# Patient Record
Sex: Female | Born: 1950 | Race: White | Hispanic: No | Marital: Married | Smoking: Current every day smoker
Health system: Southern US, Community
[De-identification: ages and names within clinical notes are randomized; demographics above are authoritative.]

## PROBLEM LIST (undated history)

## (undated) ENCOUNTER — Emergency Department (HOSPITAL_COMMUNITY): Disposition: A | Discharge status: Home / Self Care | Payer: Medicaid Other

## (undated) DIAGNOSIS — C801 Malignant (primary) neoplasm, unspecified: Secondary | ICD-10-CM

## (undated) DIAGNOSIS — J449 Chronic obstructive pulmonary disease, unspecified: Secondary | ICD-10-CM

## (undated) DIAGNOSIS — Z765 Malingerer [conscious simulation]: Secondary | ICD-10-CM

## (undated) DIAGNOSIS — F1910 Other psychoactive substance abuse, uncomplicated: Secondary | ICD-10-CM

## (undated) DIAGNOSIS — R569 Unspecified convulsions: Secondary | ICD-10-CM

## (undated) DIAGNOSIS — K859 Acute pancreatitis without necrosis or infection, unspecified: Secondary | ICD-10-CM

## (undated) HISTORY — PX: HIP SURGERY: SHX245

## (undated) HISTORY — PX: PARTIAL GASTRECTOMY: SHX2172

## (undated) HISTORY — PX: KIDNEY SURGERY: SHX687

---

## 1997-06-24 ENCOUNTER — Emergency Department (HOSPITAL_COMMUNITY): Admission: EM | Admit: 1997-06-24 | Discharge: 1997-06-25 | Payer: Self-pay | Admitting: Internal Medicine

## 1997-07-11 ENCOUNTER — Emergency Department (HOSPITAL_COMMUNITY): Admission: EM | Admit: 1997-07-11 | Discharge: 1997-07-11 | Payer: Self-pay | Admitting: Emergency Medicine

## 1997-08-12 ENCOUNTER — Emergency Department (HOSPITAL_COMMUNITY): Admission: EM | Admit: 1997-08-12 | Discharge: 1997-08-12 | Payer: Self-pay | Admitting: Emergency Medicine

## 1997-08-16 ENCOUNTER — Emergency Department (HOSPITAL_COMMUNITY): Admission: EM | Admit: 1997-08-16 | Discharge: 1997-08-16 | Payer: Self-pay | Admitting: Emergency Medicine

## 1997-09-26 ENCOUNTER — Emergency Department (HOSPITAL_COMMUNITY): Admission: EM | Admit: 1997-09-26 | Discharge: 1997-09-26 | Payer: Self-pay | Admitting: Emergency Medicine

## 1997-10-26 ENCOUNTER — Inpatient Hospital Stay (HOSPITAL_COMMUNITY): Admission: EM | Admit: 1997-10-26 | Discharge: 1997-10-29 | Payer: Self-pay | Admitting: Emergency Medicine

## 1997-10-28 ENCOUNTER — Encounter: Payer: Self-pay | Admitting: Gastroenterology

## 1997-10-28 ENCOUNTER — Encounter (HOSPITAL_BASED_OUTPATIENT_CLINIC_OR_DEPARTMENT_OTHER): Payer: Self-pay | Admitting: General Surgery

## 1997-12-02 ENCOUNTER — Emergency Department (HOSPITAL_COMMUNITY): Admission: EM | Admit: 1997-12-02 | Discharge: 1997-12-03 | Payer: Self-pay | Admitting: Emergency Medicine

## 1997-12-02 ENCOUNTER — Encounter: Payer: Self-pay | Admitting: Emergency Medicine

## 1997-12-07 ENCOUNTER — Emergency Department (HOSPITAL_COMMUNITY): Admission: EM | Admit: 1997-12-07 | Discharge: 1997-12-07 | Payer: Self-pay | Admitting: Emergency Medicine

## 1997-12-07 ENCOUNTER — Encounter: Payer: Self-pay | Admitting: Emergency Medicine

## 1998-03-23 ENCOUNTER — Emergency Department (HOSPITAL_COMMUNITY): Admission: EM | Admit: 1998-03-23 | Discharge: 1998-03-23 | Payer: Self-pay | Admitting: Emergency Medicine

## 1998-04-19 ENCOUNTER — Encounter: Payer: Self-pay | Admitting: Gastroenterology

## 1998-04-19 ENCOUNTER — Inpatient Hospital Stay (HOSPITAL_COMMUNITY): Admission: EM | Admit: 1998-04-19 | Discharge: 1998-04-21 | Payer: Self-pay | Admitting: Emergency Medicine

## 1998-04-19 ENCOUNTER — Encounter: Payer: Self-pay | Admitting: Emergency Medicine

## 1998-06-19 ENCOUNTER — Inpatient Hospital Stay (HOSPITAL_COMMUNITY): Admission: AD | Admit: 1998-06-19 | Discharge: 1998-06-19 | Payer: Self-pay | Admitting: Obstetrics & Gynecology

## 1999-03-12 ENCOUNTER — Encounter: Payer: Self-pay | Admitting: Gastroenterology

## 1999-03-12 ENCOUNTER — Encounter: Admission: RE | Admit: 1999-03-12 | Discharge: 1999-03-12 | Payer: Self-pay | Admitting: Gastroenterology

## 1999-03-16 ENCOUNTER — Encounter: Payer: Self-pay | Admitting: Internal Medicine

## 1999-03-16 ENCOUNTER — Emergency Department (HOSPITAL_COMMUNITY): Admission: EM | Admit: 1999-03-16 | Discharge: 1999-03-16 | Payer: Self-pay | Admitting: Internal Medicine

## 1999-04-03 ENCOUNTER — Encounter (INDEPENDENT_AMBULATORY_CARE_PROVIDER_SITE_OTHER): Payer: Self-pay | Admitting: Specialist

## 1999-04-04 ENCOUNTER — Inpatient Hospital Stay (HOSPITAL_COMMUNITY): Admission: EM | Admit: 1999-04-04 | Discharge: 1999-04-07 | Payer: Self-pay | Admitting: Emergency Medicine

## 1999-05-10 ENCOUNTER — Emergency Department (HOSPITAL_COMMUNITY): Admission: EM | Admit: 1999-05-10 | Discharge: 1999-05-10 | Payer: Self-pay | Admitting: Emergency Medicine

## 1999-05-10 ENCOUNTER — Encounter: Payer: Self-pay | Admitting: Emergency Medicine

## 1999-06-04 ENCOUNTER — Encounter: Payer: Self-pay | Admitting: Emergency Medicine

## 1999-06-04 ENCOUNTER — Inpatient Hospital Stay (HOSPITAL_COMMUNITY): Admission: EM | Admit: 1999-06-04 | Discharge: 1999-06-08 | Payer: Self-pay | Admitting: Emergency Medicine

## 1999-08-24 ENCOUNTER — Encounter: Payer: Self-pay | Admitting: Obstetrics

## 1999-08-24 ENCOUNTER — Emergency Department (HOSPITAL_COMMUNITY): Admission: EM | Admit: 1999-08-24 | Discharge: 1999-08-24 | Payer: Self-pay | Admitting: Emergency Medicine

## 1999-08-24 ENCOUNTER — Inpatient Hospital Stay (HOSPITAL_COMMUNITY): Admission: AD | Admit: 1999-08-24 | Discharge: 1999-08-24 | Payer: Self-pay | Admitting: Obstetrics

## 1999-09-01 ENCOUNTER — Inpatient Hospital Stay (HOSPITAL_COMMUNITY): Admission: EM | Admit: 1999-09-01 | Discharge: 1999-09-05 | Payer: Self-pay | Admitting: Emergency Medicine

## 1999-09-01 ENCOUNTER — Encounter: Payer: Self-pay | Admitting: Emergency Medicine

## 1999-10-12 ENCOUNTER — Inpatient Hospital Stay (HOSPITAL_COMMUNITY): Admission: EM | Admit: 1999-10-12 | Discharge: 1999-10-17 | Payer: Self-pay | Admitting: Emergency Medicine

## 1999-10-14 ENCOUNTER — Encounter: Payer: Self-pay | Admitting: Pulmonary Disease

## 1999-10-20 ENCOUNTER — Inpatient Hospital Stay (HOSPITAL_COMMUNITY): Admission: EM | Admit: 1999-10-20 | Discharge: 1999-10-23 | Payer: Self-pay | Admitting: Emergency Medicine

## 1999-10-20 ENCOUNTER — Encounter: Payer: Self-pay | Admitting: Emergency Medicine

## 1999-11-26 ENCOUNTER — Emergency Department (HOSPITAL_COMMUNITY): Admission: EM | Admit: 1999-11-26 | Discharge: 1999-11-26 | Payer: Self-pay | Admitting: Emergency Medicine

## 1999-11-29 ENCOUNTER — Inpatient Hospital Stay (HOSPITAL_COMMUNITY): Admission: EM | Admit: 1999-11-29 | Discharge: 1999-12-08 | Payer: Self-pay | Admitting: Emergency Medicine

## 1999-11-29 ENCOUNTER — Encounter (HOSPITAL_BASED_OUTPATIENT_CLINIC_OR_DEPARTMENT_OTHER): Payer: Self-pay | Admitting: General Surgery

## 1999-11-29 ENCOUNTER — Encounter: Payer: Self-pay | Admitting: Pulmonary Disease

## 1999-12-02 ENCOUNTER — Encounter: Payer: Self-pay | Admitting: Pulmonary Disease

## 1999-12-19 ENCOUNTER — Encounter: Payer: Self-pay | Admitting: Emergency Medicine

## 1999-12-19 ENCOUNTER — Emergency Department (HOSPITAL_COMMUNITY): Admission: EM | Admit: 1999-12-19 | Discharge: 1999-12-19 | Payer: Self-pay | Admitting: Emergency Medicine

## 1999-12-28 ENCOUNTER — Emergency Department (HOSPITAL_COMMUNITY): Admission: EM | Admit: 1999-12-28 | Discharge: 1999-12-28 | Payer: Self-pay | Admitting: *Deleted

## 1999-12-31 ENCOUNTER — Emergency Department (HOSPITAL_COMMUNITY): Admission: EM | Admit: 1999-12-31 | Discharge: 1999-12-31 | Payer: Self-pay | Admitting: Emergency Medicine

## 1999-12-31 ENCOUNTER — Encounter: Payer: Self-pay | Admitting: Emergency Medicine

## 2000-01-04 ENCOUNTER — Encounter: Payer: Self-pay | Admitting: Emergency Medicine

## 2000-01-04 ENCOUNTER — Encounter (INDEPENDENT_AMBULATORY_CARE_PROVIDER_SITE_OTHER): Payer: Self-pay | Admitting: *Deleted

## 2000-01-04 ENCOUNTER — Inpatient Hospital Stay (HOSPITAL_COMMUNITY): Admission: EM | Admit: 2000-01-04 | Discharge: 2000-01-24 | Payer: Self-pay

## 2000-01-06 ENCOUNTER — Encounter: Payer: Self-pay | Admitting: Pulmonary Disease

## 2000-01-13 ENCOUNTER — Encounter (HOSPITAL_BASED_OUTPATIENT_CLINIC_OR_DEPARTMENT_OTHER): Payer: Self-pay | Admitting: General Surgery

## 2000-01-14 ENCOUNTER — Encounter (HOSPITAL_BASED_OUTPATIENT_CLINIC_OR_DEPARTMENT_OTHER): Payer: Self-pay | Admitting: General Surgery

## 2000-02-02 ENCOUNTER — Encounter: Admission: RE | Admit: 2000-02-02 | Discharge: 2000-02-02 | Payer: Self-pay | Admitting: Hematology and Oncology

## 2000-02-19 ENCOUNTER — Encounter: Admission: RE | Admit: 2000-02-19 | Discharge: 2000-02-19 | Payer: Self-pay | Admitting: Internal Medicine

## 2000-02-29 ENCOUNTER — Encounter: Admission: RE | Admit: 2000-02-29 | Discharge: 2000-02-29 | Payer: Self-pay | Admitting: Internal Medicine

## 2000-03-02 ENCOUNTER — Encounter: Payer: Self-pay | Admitting: Internal Medicine

## 2000-03-02 ENCOUNTER — Encounter: Admission: RE | Admit: 2000-03-02 | Discharge: 2000-03-02 | Payer: Self-pay | Admitting: Internal Medicine

## 2000-03-02 ENCOUNTER — Inpatient Hospital Stay (HOSPITAL_COMMUNITY): Admission: AD | Admit: 2000-03-02 | Discharge: 2000-03-03 | Payer: Self-pay | Admitting: Internal Medicine

## 2000-03-12 ENCOUNTER — Encounter: Payer: Self-pay | Admitting: Emergency Medicine

## 2000-03-12 ENCOUNTER — Inpatient Hospital Stay (HOSPITAL_COMMUNITY): Admission: EM | Admit: 2000-03-12 | Discharge: 2000-03-14 | Payer: Self-pay | Admitting: Emergency Medicine

## 2000-03-22 ENCOUNTER — Encounter: Admission: RE | Admit: 2000-03-22 | Discharge: 2000-03-22 | Payer: Self-pay | Admitting: Hematology and Oncology

## 2000-04-03 ENCOUNTER — Inpatient Hospital Stay (HOSPITAL_COMMUNITY): Admission: EM | Admit: 2000-04-03 | Discharge: 2000-04-06 | Payer: Self-pay | Admitting: Emergency Medicine

## 2000-04-03 ENCOUNTER — Encounter: Payer: Self-pay | Admitting: Emergency Medicine

## 2000-04-08 ENCOUNTER — Encounter: Admission: RE | Admit: 2000-04-08 | Discharge: 2000-04-08 | Payer: Self-pay | Admitting: Internal Medicine

## 2000-04-16 ENCOUNTER — Emergency Department (HOSPITAL_COMMUNITY): Admission: EM | Admit: 2000-04-16 | Discharge: 2000-04-16 | Payer: Self-pay | Admitting: Emergency Medicine

## 2000-04-21 ENCOUNTER — Encounter: Payer: Self-pay | Admitting: Emergency Medicine

## 2000-04-21 ENCOUNTER — Inpatient Hospital Stay (HOSPITAL_COMMUNITY): Admission: EM | Admit: 2000-04-21 | Discharge: 2000-04-28 | Payer: Self-pay | Admitting: *Deleted

## 2000-04-29 ENCOUNTER — Emergency Department (HOSPITAL_COMMUNITY): Admission: EM | Admit: 2000-04-29 | Discharge: 2000-04-29 | Payer: Self-pay | Admitting: Emergency Medicine

## 2000-05-18 ENCOUNTER — Inpatient Hospital Stay (HOSPITAL_COMMUNITY): Admission: EM | Admit: 2000-05-18 | Discharge: 2000-05-19 | Payer: Self-pay | Admitting: Emergency Medicine

## 2000-05-18 ENCOUNTER — Encounter: Payer: Self-pay | Admitting: Emergency Medicine

## 2000-05-26 ENCOUNTER — Encounter: Admission: RE | Admit: 2000-05-26 | Discharge: 2000-05-26 | Payer: Self-pay | Admitting: Internal Medicine

## 2000-06-14 ENCOUNTER — Emergency Department (HOSPITAL_COMMUNITY): Admission: EM | Admit: 2000-06-14 | Discharge: 2000-06-14 | Payer: Self-pay | Admitting: Emergency Medicine

## 2000-06-14 ENCOUNTER — Encounter: Payer: Self-pay | Admitting: Emergency Medicine

## 2000-06-16 ENCOUNTER — Encounter: Admission: RE | Admit: 2000-06-16 | Discharge: 2000-06-16 | Payer: Self-pay | Admitting: Internal Medicine

## 2000-06-17 ENCOUNTER — Encounter: Admission: RE | Admit: 2000-06-17 | Discharge: 2000-06-17 | Payer: Self-pay | Admitting: Internal Medicine

## 2000-07-01 ENCOUNTER — Encounter: Payer: Self-pay | Admitting: Emergency Medicine

## 2000-07-01 ENCOUNTER — Emergency Department (HOSPITAL_COMMUNITY): Admission: EM | Admit: 2000-07-01 | Discharge: 2000-07-01 | Payer: Self-pay | Admitting: Emergency Medicine

## 2000-07-15 ENCOUNTER — Emergency Department (HOSPITAL_COMMUNITY): Admission: EM | Admit: 2000-07-15 | Discharge: 2000-07-15 | Payer: Self-pay | Admitting: Emergency Medicine

## 2000-07-15 ENCOUNTER — Encounter: Payer: Self-pay | Admitting: Emergency Medicine

## 2000-07-20 ENCOUNTER — Encounter: Payer: Self-pay | Admitting: Emergency Medicine

## 2000-07-20 ENCOUNTER — Emergency Department (HOSPITAL_COMMUNITY): Admission: EM | Admit: 2000-07-20 | Discharge: 2000-07-20 | Payer: Self-pay | Admitting: Emergency Medicine

## 2000-07-23 ENCOUNTER — Inpatient Hospital Stay (HOSPITAL_COMMUNITY): Admission: EM | Admit: 2000-07-23 | Discharge: 2000-08-02 | Payer: Self-pay | Admitting: *Deleted

## 2000-08-05 ENCOUNTER — Encounter: Admission: RE | Admit: 2000-08-05 | Discharge: 2000-08-05 | Payer: Self-pay | Admitting: Internal Medicine

## 2000-08-10 ENCOUNTER — Emergency Department (HOSPITAL_COMMUNITY): Admission: EM | Admit: 2000-08-10 | Discharge: 2000-08-10 | Payer: Self-pay | Admitting: Emergency Medicine

## 2000-08-11 ENCOUNTER — Emergency Department (HOSPITAL_COMMUNITY): Admission: EM | Admit: 2000-08-11 | Discharge: 2000-08-11 | Payer: Self-pay | Admitting: Emergency Medicine

## 2000-09-26 ENCOUNTER — Emergency Department (HOSPITAL_COMMUNITY): Admission: EM | Admit: 2000-09-26 | Discharge: 2000-09-26 | Payer: Self-pay | Admitting: Emergency Medicine

## 2000-10-04 ENCOUNTER — Inpatient Hospital Stay (HOSPITAL_COMMUNITY): Admission: EM | Admit: 2000-10-04 | Discharge: 2000-10-09 | Payer: Self-pay | Admitting: Psychiatry

## 2000-10-26 ENCOUNTER — Emergency Department (HOSPITAL_COMMUNITY): Admission: EM | Admit: 2000-10-26 | Discharge: 2000-10-26 | Payer: Self-pay | Admitting: Emergency Medicine

## 2000-10-30 ENCOUNTER — Emergency Department (HOSPITAL_COMMUNITY): Admission: EM | Admit: 2000-10-30 | Discharge: 2000-10-30 | Payer: Self-pay | Admitting: Emergency Medicine

## 2000-11-01 ENCOUNTER — Inpatient Hospital Stay (HOSPITAL_COMMUNITY): Admission: EM | Admit: 2000-11-01 | Discharge: 2000-11-06 | Payer: Self-pay | Admitting: Psychiatry

## 2000-11-08 ENCOUNTER — Encounter: Admission: RE | Admit: 2000-11-08 | Discharge: 2000-11-08 | Payer: Self-pay | Admitting: Internal Medicine

## 2000-12-21 ENCOUNTER — Emergency Department (HOSPITAL_COMMUNITY): Admission: EM | Admit: 2000-12-21 | Discharge: 2000-12-21 | Payer: Self-pay | Admitting: Emergency Medicine

## 2000-12-21 ENCOUNTER — Encounter: Payer: Self-pay | Admitting: Emergency Medicine

## 2000-12-23 ENCOUNTER — Encounter: Payer: Self-pay | Admitting: Emergency Medicine

## 2000-12-23 ENCOUNTER — Emergency Department (HOSPITAL_COMMUNITY): Admission: EM | Admit: 2000-12-23 | Discharge: 2000-12-23 | Payer: Self-pay | Admitting: Emergency Medicine

## 2000-12-25 ENCOUNTER — Emergency Department (HOSPITAL_COMMUNITY): Admission: EM | Admit: 2000-12-25 | Discharge: 2000-12-25 | Payer: Self-pay

## 2001-01-18 ENCOUNTER — Encounter: Admission: RE | Admit: 2001-01-18 | Discharge: 2001-01-18 | Payer: Self-pay

## 2001-01-21 ENCOUNTER — Encounter: Payer: Self-pay | Admitting: Internal Medicine

## 2001-01-21 ENCOUNTER — Encounter (INDEPENDENT_AMBULATORY_CARE_PROVIDER_SITE_OTHER): Payer: Self-pay | Admitting: Specialist

## 2001-01-21 ENCOUNTER — Inpatient Hospital Stay (HOSPITAL_COMMUNITY): Admission: EM | Admit: 2001-01-21 | Discharge: 2001-01-28 | Payer: Self-pay | Admitting: *Deleted

## 2001-01-22 ENCOUNTER — Encounter: Payer: Self-pay | Admitting: Internal Medicine

## 2001-01-26 ENCOUNTER — Encounter: Payer: Self-pay | Admitting: Internal Medicine

## 2001-01-27 ENCOUNTER — Encounter: Payer: Self-pay | Admitting: Internal Medicine

## 2001-02-14 ENCOUNTER — Emergency Department (HOSPITAL_COMMUNITY): Admission: EM | Admit: 2001-02-14 | Discharge: 2001-02-14 | Payer: Self-pay | Admitting: Emergency Medicine

## 2001-02-15 ENCOUNTER — Emergency Department (HOSPITAL_COMMUNITY): Admission: EM | Admit: 2001-02-15 | Discharge: 2001-02-15 | Payer: Self-pay | Admitting: *Deleted

## 2001-02-16 ENCOUNTER — Encounter: Payer: Self-pay | Admitting: Emergency Medicine

## 2001-02-16 ENCOUNTER — Emergency Department (HOSPITAL_COMMUNITY): Admission: EM | Admit: 2001-02-16 | Discharge: 2001-02-16 | Payer: Self-pay | Admitting: Emergency Medicine

## 2001-03-12 ENCOUNTER — Emergency Department (HOSPITAL_COMMUNITY): Admission: EM | Admit: 2001-03-12 | Discharge: 2001-03-13 | Payer: Self-pay | Admitting: Emergency Medicine

## 2001-04-19 ENCOUNTER — Emergency Department (HOSPITAL_COMMUNITY): Admission: EM | Admit: 2001-04-19 | Discharge: 2001-04-19 | Payer: Self-pay | Admitting: Emergency Medicine

## 2001-04-22 ENCOUNTER — Emergency Department (HOSPITAL_COMMUNITY): Admission: EM | Admit: 2001-04-22 | Discharge: 2001-04-22 | Payer: Self-pay | Admitting: Emergency Medicine

## 2001-04-26 ENCOUNTER — Emergency Department (HOSPITAL_COMMUNITY): Admission: EM | Admit: 2001-04-26 | Discharge: 2001-04-26 | Payer: Self-pay | Admitting: Emergency Medicine

## 2001-04-26 ENCOUNTER — Encounter: Admission: RE | Admit: 2001-04-26 | Discharge: 2001-04-26 | Payer: Self-pay | Admitting: Internal Medicine

## 2001-05-02 ENCOUNTER — Emergency Department (HOSPITAL_COMMUNITY): Admission: EM | Admit: 2001-05-02 | Discharge: 2001-05-02 | Payer: Self-pay | Admitting: Emergency Medicine

## 2001-05-06 ENCOUNTER — Emergency Department (HOSPITAL_COMMUNITY): Admission: EM | Admit: 2001-05-06 | Discharge: 2001-05-06 | Payer: Self-pay | Admitting: *Deleted

## 2001-05-07 ENCOUNTER — Emergency Department (HOSPITAL_COMMUNITY): Admission: EM | Admit: 2001-05-07 | Discharge: 2001-05-07 | Payer: Self-pay | Admitting: Emergency Medicine

## 2001-05-15 ENCOUNTER — Inpatient Hospital Stay (HOSPITAL_COMMUNITY): Admission: EM | Admit: 2001-05-15 | Discharge: 2001-05-25 | Payer: Self-pay

## 2001-05-15 ENCOUNTER — Encounter: Payer: Self-pay | Admitting: Internal Medicine

## 2001-06-01 ENCOUNTER — Encounter: Payer: Self-pay | Admitting: Emergency Medicine

## 2001-06-01 ENCOUNTER — Emergency Department (HOSPITAL_COMMUNITY): Admission: EM | Admit: 2001-06-01 | Discharge: 2001-06-01 | Payer: Self-pay | Admitting: Emergency Medicine

## 2001-06-05 ENCOUNTER — Encounter: Payer: Self-pay | Admitting: Geriatric Medicine

## 2001-06-05 ENCOUNTER — Inpatient Hospital Stay (HOSPITAL_COMMUNITY): Admission: EM | Admit: 2001-06-05 | Discharge: 2001-06-09 | Payer: Self-pay | Admitting: Emergency Medicine

## 2001-06-05 ENCOUNTER — Encounter: Payer: Self-pay | Admitting: Internal Medicine

## 2001-06-15 ENCOUNTER — Emergency Department (HOSPITAL_COMMUNITY): Admission: EM | Admit: 2001-06-15 | Discharge: 2001-06-15 | Payer: Self-pay | Admitting: Emergency Medicine

## 2001-06-15 ENCOUNTER — Encounter: Payer: Self-pay | Admitting: Emergency Medicine

## 2001-06-19 ENCOUNTER — Emergency Department (HOSPITAL_COMMUNITY): Admission: EM | Admit: 2001-06-19 | Discharge: 2001-06-19 | Payer: Self-pay | Admitting: Emergency Medicine

## 2001-06-19 ENCOUNTER — Encounter: Payer: Self-pay | Admitting: Emergency Medicine

## 2001-06-21 ENCOUNTER — Encounter: Admission: RE | Admit: 2001-06-21 | Discharge: 2001-06-21 | Payer: Self-pay | Admitting: Internal Medicine

## 2001-06-28 ENCOUNTER — Encounter: Admission: RE | Admit: 2001-06-28 | Discharge: 2001-06-28 | Payer: Self-pay | Admitting: Internal Medicine

## 2001-07-05 ENCOUNTER — Encounter: Admission: RE | Admit: 2001-07-05 | Discharge: 2001-07-05 | Payer: Self-pay | Admitting: Internal Medicine

## 2001-07-08 ENCOUNTER — Emergency Department (HOSPITAL_COMMUNITY): Admission: EM | Admit: 2001-07-08 | Discharge: 2001-07-08 | Payer: Self-pay | Admitting: Emergency Medicine

## 2001-07-08 ENCOUNTER — Encounter: Payer: Self-pay | Admitting: Emergency Medicine

## 2001-07-14 ENCOUNTER — Emergency Department (HOSPITAL_COMMUNITY): Admission: EM | Admit: 2001-07-14 | Discharge: 2001-07-14 | Payer: Self-pay | Admitting: Emergency Medicine

## 2001-07-17 ENCOUNTER — Inpatient Hospital Stay (HOSPITAL_COMMUNITY): Admission: AD | Admit: 2001-07-17 | Discharge: 2001-07-25 | Payer: Self-pay | Admitting: Internal Medicine

## 2001-07-17 ENCOUNTER — Encounter: Payer: Self-pay | Admitting: Internal Medicine

## 2001-07-17 ENCOUNTER — Emergency Department (HOSPITAL_COMMUNITY): Admission: EM | Admit: 2001-07-17 | Discharge: 2001-07-17 | Payer: Self-pay | Admitting: Emergency Medicine

## 2001-07-19 ENCOUNTER — Encounter: Payer: Self-pay | Admitting: Internal Medicine

## 2001-07-27 ENCOUNTER — Emergency Department (HOSPITAL_COMMUNITY): Admission: EM | Admit: 2001-07-27 | Discharge: 2001-07-28 | Payer: Self-pay | Admitting: Emergency Medicine

## 2001-07-29 ENCOUNTER — Emergency Department (HOSPITAL_COMMUNITY): Admission: EM | Admit: 2001-07-29 | Discharge: 2001-07-29 | Payer: Self-pay | Admitting: Emergency Medicine

## 2001-07-31 ENCOUNTER — Inpatient Hospital Stay (HOSPITAL_COMMUNITY): Admission: EM | Admit: 2001-07-31 | Discharge: 2001-08-03 | Payer: Self-pay | Admitting: Emergency Medicine

## 2001-08-19 ENCOUNTER — Emergency Department (HOSPITAL_COMMUNITY): Admission: EM | Admit: 2001-08-19 | Discharge: 2001-08-19 | Payer: Self-pay | Admitting: Emergency Medicine

## 2001-08-23 ENCOUNTER — Emergency Department (HOSPITAL_COMMUNITY): Admission: EM | Admit: 2001-08-23 | Discharge: 2001-08-23 | Payer: Self-pay | Admitting: *Deleted

## 2001-08-24 ENCOUNTER — Emergency Department (HOSPITAL_COMMUNITY): Admission: EM | Admit: 2001-08-24 | Discharge: 2001-08-24 | Payer: Self-pay | Admitting: Emergency Medicine

## 2001-08-26 ENCOUNTER — Encounter: Payer: Self-pay | Admitting: Internal Medicine

## 2001-08-26 ENCOUNTER — Inpatient Hospital Stay (HOSPITAL_COMMUNITY): Admission: EM | Admit: 2001-08-26 | Discharge: 2001-08-30 | Payer: Self-pay | Admitting: Emergency Medicine

## 2001-09-08 ENCOUNTER — Emergency Department (HOSPITAL_COMMUNITY): Admission: EM | Admit: 2001-09-08 | Discharge: 2001-09-08 | Payer: Self-pay | Admitting: Emergency Medicine

## 2001-09-13 ENCOUNTER — Emergency Department (HOSPITAL_COMMUNITY): Admission: EM | Admit: 2001-09-13 | Discharge: 2001-09-13 | Payer: Self-pay | Admitting: Emergency Medicine

## 2001-09-14 ENCOUNTER — Emergency Department (HOSPITAL_COMMUNITY): Admission: EM | Admit: 2001-09-14 | Discharge: 2001-09-14 | Payer: Self-pay | Admitting: Emergency Medicine

## 2001-09-26 ENCOUNTER — Emergency Department (HOSPITAL_COMMUNITY): Admission: EM | Admit: 2001-09-26 | Discharge: 2001-09-26 | Payer: Self-pay | Admitting: Emergency Medicine

## 2001-10-13 ENCOUNTER — Emergency Department (HOSPITAL_COMMUNITY): Admission: EM | Admit: 2001-10-13 | Discharge: 2001-10-13 | Payer: Self-pay | Admitting: Emergency Medicine

## 2001-10-14 ENCOUNTER — Emergency Department (HOSPITAL_COMMUNITY): Admission: EM | Admit: 2001-10-14 | Discharge: 2001-10-15 | Payer: Self-pay | Admitting: Emergency Medicine

## 2001-10-16 ENCOUNTER — Emergency Department (HOSPITAL_COMMUNITY): Admission: EM | Admit: 2001-10-16 | Discharge: 2001-10-16 | Payer: Self-pay | Admitting: Emergency Medicine

## 2001-10-19 ENCOUNTER — Emergency Department (HOSPITAL_COMMUNITY): Admission: EM | Admit: 2001-10-19 | Discharge: 2001-10-19 | Payer: Self-pay | Admitting: Emergency Medicine

## 2001-10-26 ENCOUNTER — Inpatient Hospital Stay (HOSPITAL_COMMUNITY): Admission: AD | Admit: 2001-10-26 | Discharge: 2001-10-30 | Payer: Self-pay | Admitting: Family Medicine

## 2001-10-28 ENCOUNTER — Encounter: Payer: Self-pay | Admitting: Family Medicine

## 2001-10-31 ENCOUNTER — Emergency Department (HOSPITAL_COMMUNITY): Admission: EM | Admit: 2001-10-31 | Discharge: 2001-10-31 | Payer: Self-pay | Admitting: Emergency Medicine

## 2001-11-25 ENCOUNTER — Emergency Department (HOSPITAL_COMMUNITY): Admission: EM | Admit: 2001-11-25 | Discharge: 2001-11-25 | Payer: Self-pay | Admitting: Emergency Medicine

## 2001-12-02 ENCOUNTER — Emergency Department (HOSPITAL_COMMUNITY): Admission: EM | Admit: 2001-12-02 | Discharge: 2001-12-02 | Payer: Self-pay | Admitting: Emergency Medicine

## 2002-01-06 ENCOUNTER — Emergency Department (HOSPITAL_COMMUNITY): Admission: EM | Admit: 2002-01-06 | Discharge: 2002-01-06 | Payer: Self-pay | Admitting: Emergency Medicine

## 2002-01-10 ENCOUNTER — Inpatient Hospital Stay (HOSPITAL_COMMUNITY): Admission: EM | Admit: 2002-01-10 | Discharge: 2002-01-13 | Payer: Self-pay | Admitting: Emergency Medicine

## 2002-01-30 ENCOUNTER — Emergency Department (HOSPITAL_COMMUNITY): Admission: EM | Admit: 2002-01-30 | Discharge: 2002-01-30 | Payer: Self-pay | Admitting: Emergency Medicine

## 2002-01-30 ENCOUNTER — Encounter: Payer: Self-pay | Admitting: Emergency Medicine

## 2002-02-10 ENCOUNTER — Emergency Department (HOSPITAL_COMMUNITY): Admission: EM | Admit: 2002-02-10 | Discharge: 2002-02-10 | Payer: Self-pay | Admitting: Emergency Medicine

## 2002-03-13 ENCOUNTER — Emergency Department (HOSPITAL_COMMUNITY): Admission: EM | Admit: 2002-03-13 | Discharge: 2002-03-13 | Payer: Self-pay | Admitting: *Deleted

## 2002-04-06 ENCOUNTER — Emergency Department (HOSPITAL_COMMUNITY): Admission: EM | Admit: 2002-04-06 | Discharge: 2002-04-07 | Payer: Self-pay | Admitting: Emergency Medicine

## 2002-05-15 ENCOUNTER — Emergency Department (HOSPITAL_COMMUNITY): Admission: EM | Admit: 2002-05-15 | Discharge: 2002-05-15 | Payer: Self-pay | Admitting: Emergency Medicine

## 2002-05-24 ENCOUNTER — Emergency Department (HOSPITAL_COMMUNITY): Admission: EM | Admit: 2002-05-24 | Discharge: 2002-05-24 | Payer: Self-pay | Admitting: Emergency Medicine

## 2002-05-24 ENCOUNTER — Encounter: Payer: Self-pay | Admitting: Emergency Medicine

## 2002-06-12 ENCOUNTER — Emergency Department (HOSPITAL_COMMUNITY): Admission: EM | Admit: 2002-06-12 | Discharge: 2002-06-12 | Payer: Self-pay | Admitting: Emergency Medicine

## 2002-08-07 ENCOUNTER — Emergency Department (HOSPITAL_COMMUNITY): Admission: EM | Admit: 2002-08-07 | Discharge: 2002-08-07 | Payer: Self-pay | Admitting: Emergency Medicine

## 2002-09-21 ENCOUNTER — Inpatient Hospital Stay (HOSPITAL_COMMUNITY): Admission: EM | Admit: 2002-09-21 | Discharge: 2002-09-23 | Payer: Self-pay | Admitting: Emergency Medicine

## 2002-09-22 ENCOUNTER — Encounter: Payer: Self-pay | Admitting: Family Medicine

## 2002-12-14 ENCOUNTER — Emergency Department (HOSPITAL_COMMUNITY): Admission: EM | Admit: 2002-12-14 | Discharge: 2002-12-14 | Payer: Self-pay | Admitting: *Deleted

## 2002-12-30 ENCOUNTER — Emergency Department (HOSPITAL_COMMUNITY): Admission: EM | Admit: 2002-12-30 | Discharge: 2002-12-30 | Payer: Self-pay | Admitting: Emergency Medicine

## 2003-01-05 ENCOUNTER — Emergency Department (HOSPITAL_COMMUNITY): Admission: EM | Admit: 2003-01-05 | Discharge: 2003-01-05 | Payer: Self-pay | Admitting: Emergency Medicine

## 2003-01-30 ENCOUNTER — Emergency Department (HOSPITAL_COMMUNITY): Admission: EM | Admit: 2003-01-30 | Discharge: 2003-01-30 | Payer: Self-pay | Admitting: Emergency Medicine

## 2003-03-30 ENCOUNTER — Emergency Department (HOSPITAL_COMMUNITY): Admission: EM | Admit: 2003-03-30 | Discharge: 2003-03-30 | Payer: Self-pay | Admitting: Emergency Medicine

## 2003-04-02 ENCOUNTER — Emergency Department (HOSPITAL_COMMUNITY): Admission: EM | Admit: 2003-04-02 | Discharge: 2003-04-02 | Payer: Self-pay

## 2003-04-06 ENCOUNTER — Observation Stay (HOSPITAL_COMMUNITY): Admission: EM | Admit: 2003-04-06 | Discharge: 2003-04-07 | Payer: Self-pay | Admitting: Emergency Medicine

## 2003-05-11 ENCOUNTER — Emergency Department (HOSPITAL_COMMUNITY): Admission: EM | Admit: 2003-05-11 | Discharge: 2003-05-11 | Payer: Self-pay | Admitting: Emergency Medicine

## 2003-05-21 ENCOUNTER — Emergency Department (HOSPITAL_COMMUNITY): Admission: EM | Admit: 2003-05-21 | Discharge: 2003-05-21 | Payer: Self-pay | Admitting: Emergency Medicine

## 2003-07-03 ENCOUNTER — Encounter: Payer: Self-pay | Admitting: Emergency Medicine

## 2003-07-03 ENCOUNTER — Inpatient Hospital Stay (HOSPITAL_COMMUNITY): Admission: EM | Admit: 2003-07-03 | Discharge: 2003-07-05 | Payer: Self-pay | Admitting: Internal Medicine

## 2003-07-18 ENCOUNTER — Emergency Department (HOSPITAL_COMMUNITY): Admission: EM | Admit: 2003-07-18 | Discharge: 2003-07-18 | Payer: Self-pay | Admitting: Emergency Medicine

## 2003-08-30 ENCOUNTER — Emergency Department (HOSPITAL_COMMUNITY): Admission: EM | Admit: 2003-08-30 | Discharge: 2003-08-30 | Payer: Self-pay | Admitting: Emergency Medicine

## 2003-12-01 ENCOUNTER — Emergency Department (HOSPITAL_COMMUNITY): Admission: EM | Admit: 2003-12-01 | Discharge: 2003-12-01 | Payer: Self-pay | Admitting: Emergency Medicine

## 2004-03-17 ENCOUNTER — Emergency Department (HOSPITAL_COMMUNITY): Admission: EM | Admit: 2004-03-17 | Discharge: 2004-03-17 | Payer: Self-pay | Admitting: Emergency Medicine

## 2004-06-16 IMAGING — CT CT PELVIS W/ CM
1 of 2 series · 15 of 32 positions shown, 19 images · IV contrast (omnipaque)
Comparison: none

FINDINGS
CLINICAL DATA: ABDOMINAL AND PELVIC PAIN; NAUSEA AND VOMITING
CT ABDOMEN AND PELVIS WITH CONTRAST:
TECHNIQUE: MULTIDETECTOR HELICAL CT SCANNING OBTAINED THROUGH THE ABDOMEN AND PELVIS FOLLOWING THE
ADMINISTRATION OF ORAL CONTRAST AND KQQXX INTRAVENOUS OMNIPAQUE 300.  INTRAVENOUS CONTRAST WAS HAND
INJECTED THROUGH THE PATIENT'S PICC LINE AS PATIENT WOULD NOT ALLOW FOR INTRAVENOUS STICK.

[Series 2: abd/pelvis 5.0 b30f · axial · 0.60mm/px · z∈[+1256,+1622]mm · 15 of 81 slices shown, 19 images]
[im 4/81  soft-tissue]
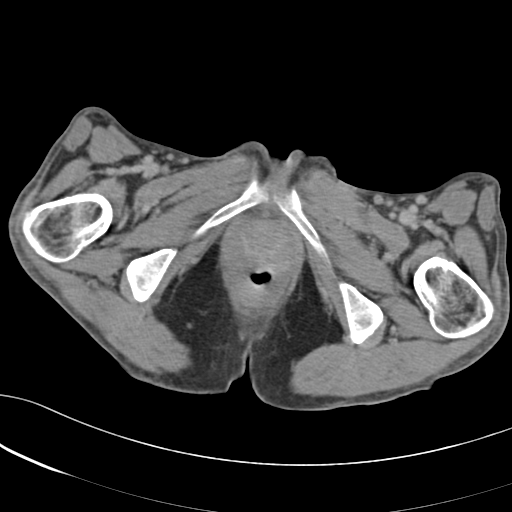
[im 4/81  bone]
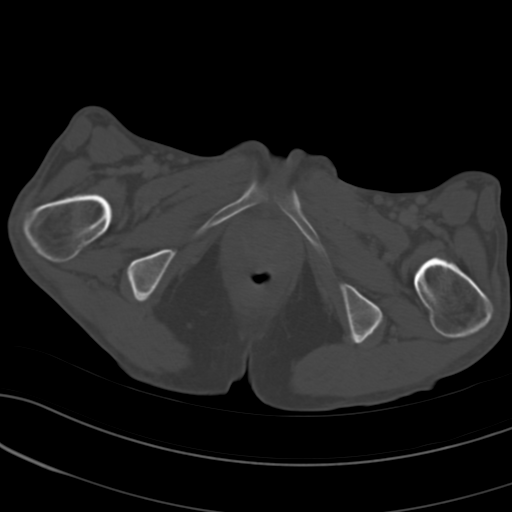
[im 10/81  soft-tissue]
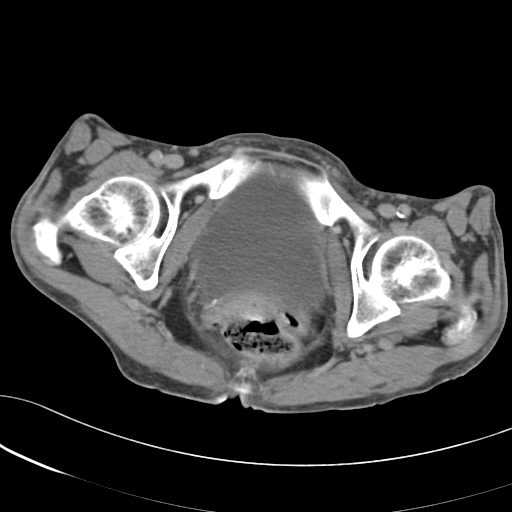
[im 17/81  soft-tissue]
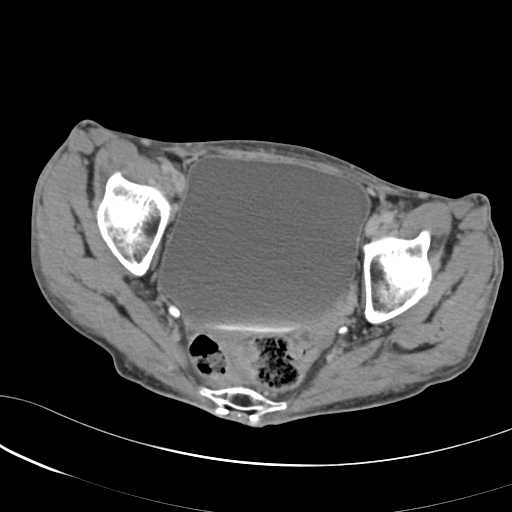
[im 23/81  soft-tissue]
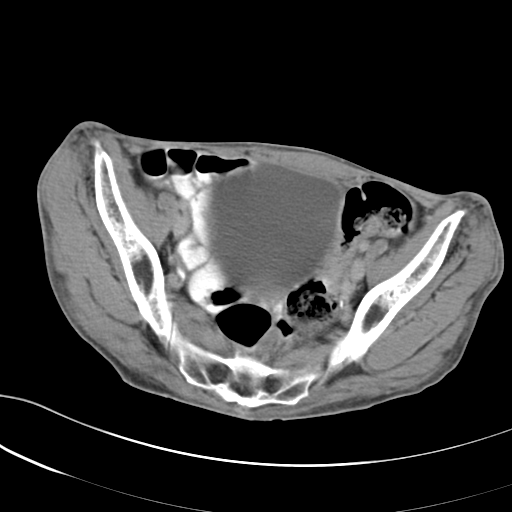
[im 29/81  soft-tissue]
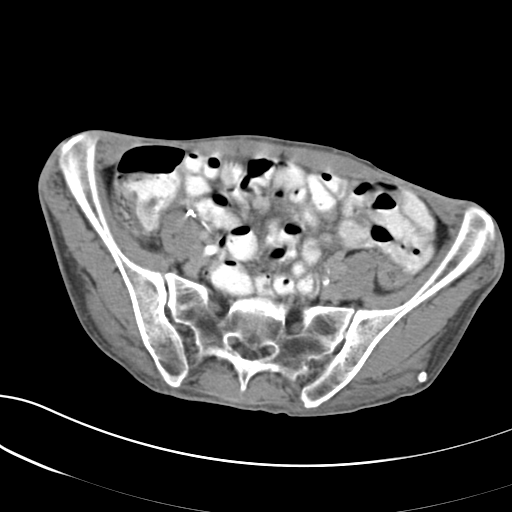
[im 36/81  soft-tissue]
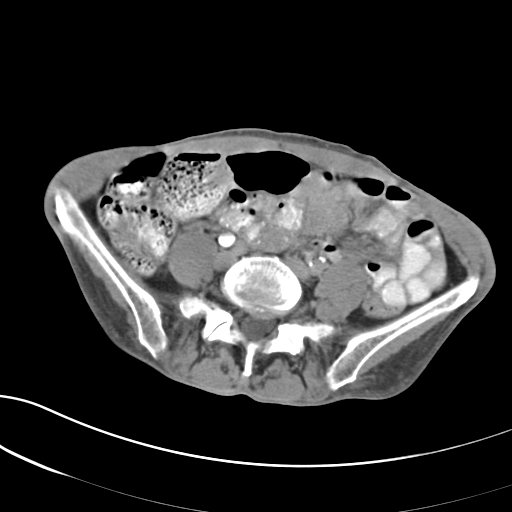
[im 42/81  soft-tissue]
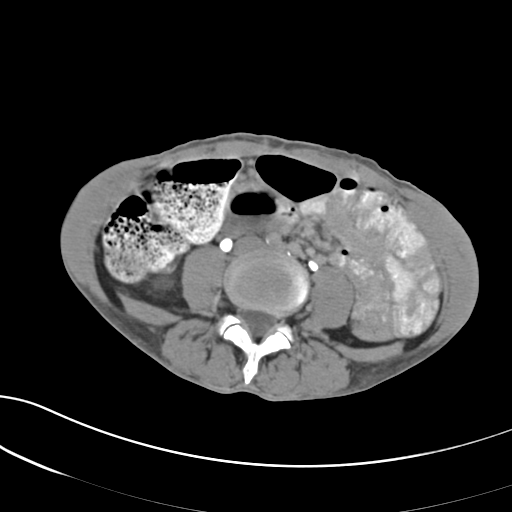
[im 45/81  soft-tissue]
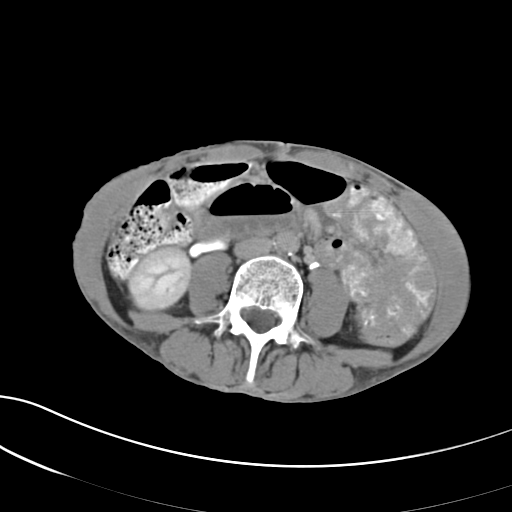
[im 52/81  soft-tissue]
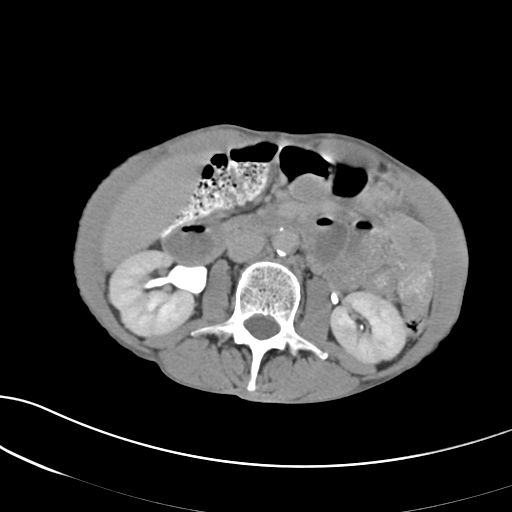
[im 52/81  bone]
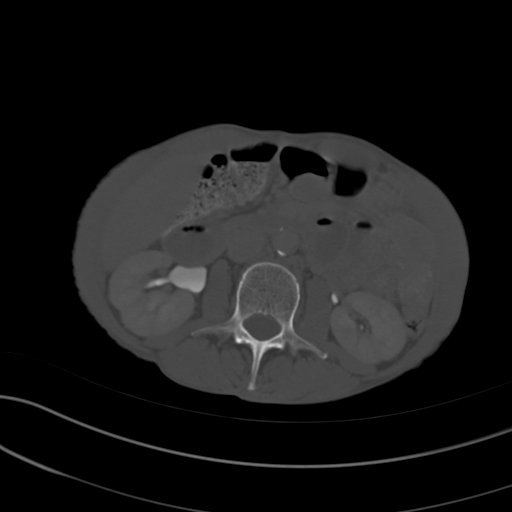
[im 58/81  soft-tissue]
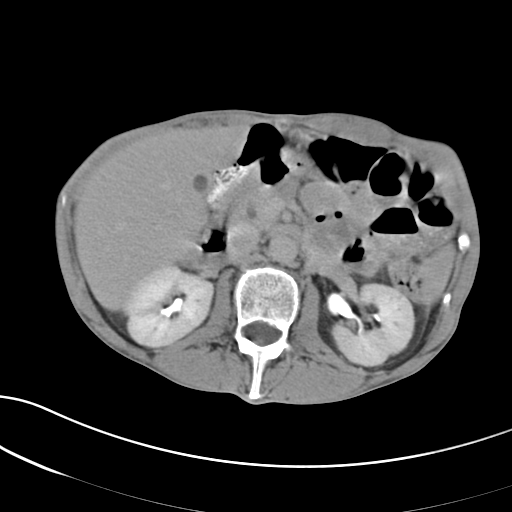
[im 65/81  soft-tissue]
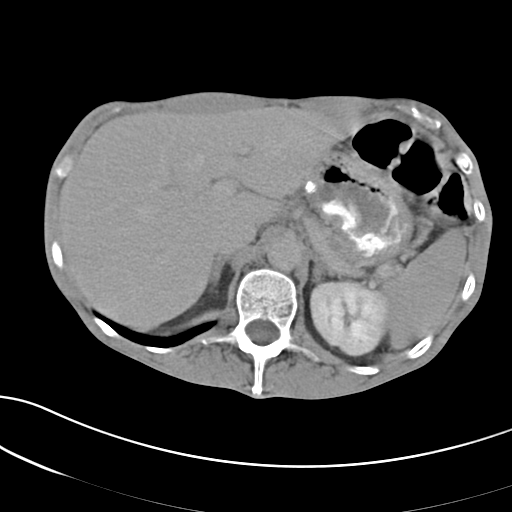
[im 68/81  lung]
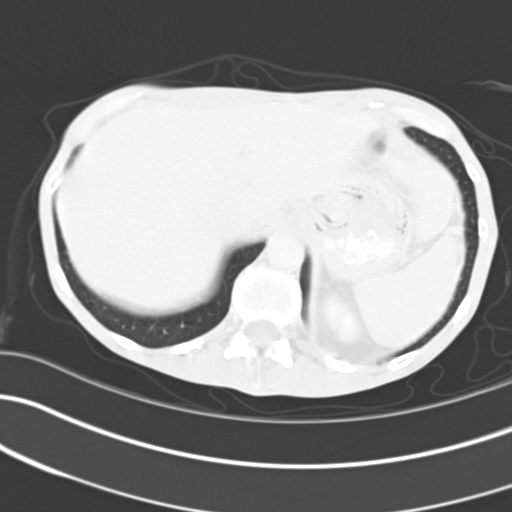
[im 71/81  soft-tissue]
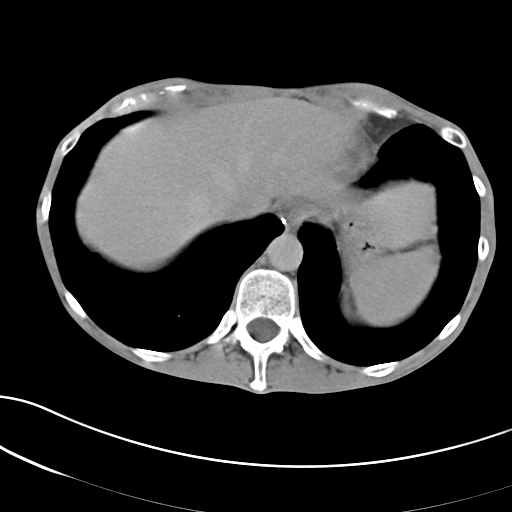
[im 71/81  lung]
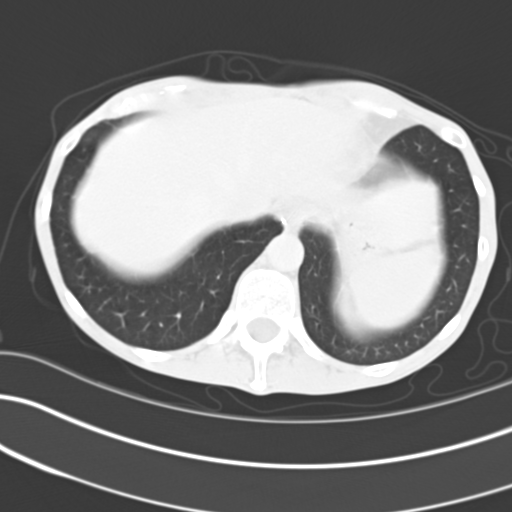
[im 74/81  lung]
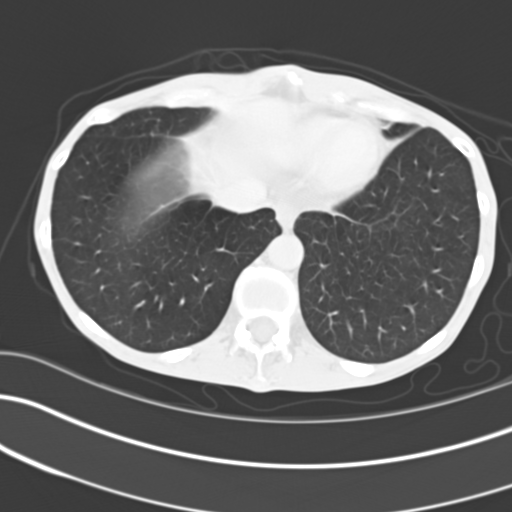
[im 77/81  soft-tissue]
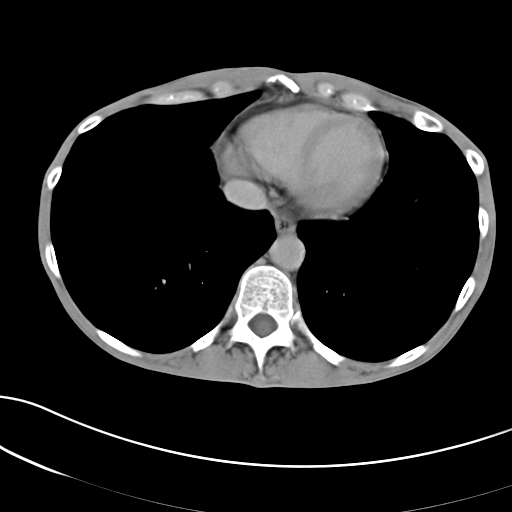
[im 77/81  lung]
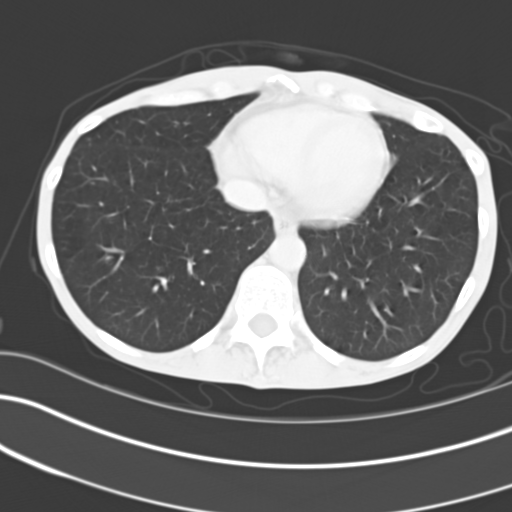

[15 of 32 positions shown; findings below may reference images not displayed]

FINDINGS: COMPARISON TO NONCONTRAST CT DATED 05/15/01 AND PREVIOUS ABDOMINAL ULTRASOUND.
CT ABDOMEN:
THE LIVER, GALLBLADDER, SPLEEN, PANCREAS, ADRENAL GLANDS AND RIGHT KIDNEY ARE UNREMARKABLE.  THERE
IS A G.5D1 LOW-DENSITY LESION WITHIN THE LOWER POLE OF THE LEFT KIDNEY ON IMAGES #31, #32 AND NOT
VISUALIZED ON PREVIOUS ULTRASOUND.  THIS MAY REPRESENT A CYST BUT IS NONSPECIFIC.  RECOMMEND EITHER
FOLLOW-UP OR FURTHER IMAGING (ULTRASOUND) FOR FURTHER EVALUATION.  NO EVIDENCE OF ENLARGED LYMPH
NODES OR FREE FLUID.  THE BOWEL IS UNREMARKABLE.  EVIDENCE OF PREVIOUS ABDOMINAL SURGERY NOTED.
IMPRESSION
1.  NO EVIDENCE OF ACUTE ABNORMALITY.
2.  G.5D1 LOW-DENSITY LESION WITHIN THE MEDIAL LEFT LOWER KIDNEY WHICH IS NOT SEEN ON PREVIOUS
ULTRASOUND.  THIS MAY REPRESENT A CYST BUT IS NONSPECIFIC.  RECOMMEND FURTHER EVALUATION WITH OTHER
IMAGING (ULTRASOUND MAY BE HELPFUL FOR INITIAL EVALUATION).
CT PELVIS:
THE BLADDER AND BOWEL ARE UNREMARKABLE.  PATIENT IS STATUS POST HYSTERECTOMY.  NO FREE FLUID OR
ENLARGED LYMPH NODES.
IMPRESSION
NO ACUTE ABNORMALITY.

## 2004-06-18 ENCOUNTER — Emergency Department (HOSPITAL_COMMUNITY): Admission: EM | Admit: 2004-06-18 | Discharge: 2004-06-18 | Payer: Self-pay | Admitting: *Deleted

## 2004-06-27 ENCOUNTER — Emergency Department (HOSPITAL_COMMUNITY): Admission: EM | Admit: 2004-06-27 | Discharge: 2004-06-27 | Payer: Self-pay | Admitting: Emergency Medicine

## 2004-07-20 ENCOUNTER — Emergency Department (HOSPITAL_COMMUNITY): Admission: EM | Admit: 2004-07-20 | Discharge: 2004-07-21 | Payer: Self-pay | Admitting: Emergency Medicine

## 2004-07-27 ENCOUNTER — Emergency Department (HOSPITAL_COMMUNITY): Admission: EM | Admit: 2004-07-27 | Discharge: 2004-07-27 | Payer: Self-pay | Admitting: Emergency Medicine

## 2004-08-27 ENCOUNTER — Emergency Department (HOSPITAL_COMMUNITY): Admission: EM | Admit: 2004-08-27 | Discharge: 2004-08-27 | Payer: Self-pay | Admitting: Emergency Medicine

## 2004-09-09 ENCOUNTER — Emergency Department (HOSPITAL_COMMUNITY): Admission: EM | Admit: 2004-09-09 | Discharge: 2004-09-09 | Payer: Self-pay | Admitting: Emergency Medicine

## 2004-09-11 ENCOUNTER — Emergency Department (HOSPITAL_COMMUNITY): Admission: EM | Admit: 2004-09-11 | Discharge: 2004-09-11 | Payer: Self-pay | Admitting: Emergency Medicine

## 2004-10-11 ENCOUNTER — Emergency Department (HOSPITAL_COMMUNITY): Admission: EM | Admit: 2004-10-11 | Discharge: 2004-10-11 | Payer: Self-pay | Admitting: Emergency Medicine

## 2004-11-02 ENCOUNTER — Emergency Department (HOSPITAL_COMMUNITY): Admission: EM | Admit: 2004-11-02 | Discharge: 2004-11-02 | Payer: Self-pay | Admitting: Emergency Medicine

## 2004-11-21 ENCOUNTER — Emergency Department (HOSPITAL_COMMUNITY): Admission: EM | Admit: 2004-11-21 | Discharge: 2004-11-21 | Payer: Self-pay | Admitting: Emergency Medicine

## 2004-11-27 ENCOUNTER — Encounter: Admission: RE | Admit: 2004-11-27 | Discharge: 2004-11-27 | Payer: Self-pay | Admitting: Gastroenterology

## 2004-12-03 ENCOUNTER — Emergency Department (HOSPITAL_COMMUNITY): Admission: EM | Admit: 2004-12-03 | Discharge: 2004-12-03 | Payer: Self-pay | Admitting: Emergency Medicine

## 2004-12-10 ENCOUNTER — Ambulatory Visit (HOSPITAL_COMMUNITY): Admission: RE | Admit: 2004-12-10 | Discharge: 2004-12-10 | Payer: Self-pay | Admitting: Gastroenterology

## 2004-12-10 ENCOUNTER — Encounter (INDEPENDENT_AMBULATORY_CARE_PROVIDER_SITE_OTHER): Payer: Self-pay | Admitting: Specialist

## 2004-12-15 ENCOUNTER — Ambulatory Visit (HOSPITAL_COMMUNITY): Admission: RE | Admit: 2004-12-15 | Discharge: 2004-12-15 | Payer: Self-pay | Admitting: Gastroenterology

## 2004-12-31 ENCOUNTER — Inpatient Hospital Stay (HOSPITAL_COMMUNITY): Admission: EM | Admit: 2004-12-31 | Discharge: 2005-01-04 | Payer: Self-pay | Admitting: Emergency Medicine

## 2005-01-13 ENCOUNTER — Emergency Department (HOSPITAL_COMMUNITY): Admission: EM | Admit: 2005-01-13 | Discharge: 2005-01-13 | Payer: Self-pay | Admitting: Emergency Medicine

## 2005-01-17 ENCOUNTER — Emergency Department (HOSPITAL_COMMUNITY): Admission: EM | Admit: 2005-01-17 | Discharge: 2005-01-17 | Payer: Self-pay | Admitting: Emergency Medicine

## 2005-01-26 ENCOUNTER — Emergency Department (HOSPITAL_COMMUNITY): Admission: EM | Admit: 2005-01-26 | Discharge: 2005-01-26 | Payer: Self-pay | Admitting: Emergency Medicine

## 2005-02-01 ENCOUNTER — Emergency Department (HOSPITAL_COMMUNITY): Admission: EM | Admit: 2005-02-01 | Discharge: 2005-02-01 | Payer: Self-pay | Admitting: Emergency Medicine

## 2005-02-12 ENCOUNTER — Emergency Department (HOSPITAL_COMMUNITY): Admission: EM | Admit: 2005-02-12 | Discharge: 2005-02-12 | Payer: Self-pay | Admitting: Emergency Medicine

## 2005-03-04 ENCOUNTER — Emergency Department (HOSPITAL_COMMUNITY): Admission: EM | Admit: 2005-03-04 | Discharge: 2005-03-04 | Payer: Self-pay | Admitting: Emergency Medicine

## 2005-03-08 ENCOUNTER — Emergency Department (HOSPITAL_COMMUNITY): Admission: EM | Admit: 2005-03-08 | Discharge: 2005-03-08 | Payer: Self-pay | Admitting: Emergency Medicine

## 2005-03-10 ENCOUNTER — Emergency Department (HOSPITAL_COMMUNITY): Admission: EM | Admit: 2005-03-10 | Discharge: 2005-03-10 | Payer: Self-pay | Admitting: Emergency Medicine

## 2005-03-12 ENCOUNTER — Inpatient Hospital Stay (HOSPITAL_COMMUNITY): Admission: RE | Admit: 2005-03-12 | Discharge: 2005-03-16 | Payer: Self-pay | Admitting: Psychiatry

## 2005-03-13 ENCOUNTER — Ambulatory Visit: Payer: Self-pay | Admitting: Psychiatry

## 2005-03-27 ENCOUNTER — Emergency Department (HOSPITAL_COMMUNITY): Admission: EM | Admit: 2005-03-27 | Discharge: 2005-03-27 | Payer: Self-pay | Admitting: Emergency Medicine

## 2005-03-29 ENCOUNTER — Emergency Department (HOSPITAL_COMMUNITY): Admission: EM | Admit: 2005-03-29 | Discharge: 2005-03-29 | Payer: Self-pay | Admitting: Emergency Medicine

## 2005-03-31 ENCOUNTER — Emergency Department (HOSPITAL_COMMUNITY): Admission: EM | Admit: 2005-03-31 | Discharge: 2005-03-31 | Payer: Self-pay | Admitting: Emergency Medicine

## 2005-05-05 ENCOUNTER — Encounter (HOSPITAL_BASED_OUTPATIENT_CLINIC_OR_DEPARTMENT_OTHER): Payer: Self-pay | Admitting: General Surgery

## 2005-06-09 ENCOUNTER — Encounter: Admission: RE | Admit: 2005-06-09 | Discharge: 2005-07-13 | Payer: Self-pay | Admitting: Family Medicine

## 2005-06-16 ENCOUNTER — Inpatient Hospital Stay (HOSPITAL_COMMUNITY): Admission: EM | Admit: 2005-06-16 | Discharge: 2005-06-19 | Payer: Self-pay | Admitting: Emergency Medicine

## 2005-07-08 ENCOUNTER — Emergency Department (HOSPITAL_COMMUNITY): Admission: EM | Admit: 2005-07-08 | Discharge: 2005-07-08 | Payer: Self-pay | Admitting: Emergency Medicine

## 2005-07-19 ENCOUNTER — Inpatient Hospital Stay (HOSPITAL_COMMUNITY): Admission: EM | Admit: 2005-07-19 | Discharge: 2005-07-25 | Payer: Self-pay | Admitting: Emergency Medicine

## 2005-07-30 ENCOUNTER — Emergency Department (HOSPITAL_COMMUNITY): Admission: EM | Admit: 2005-07-30 | Discharge: 2005-07-31 | Payer: Self-pay | Admitting: Emergency Medicine

## 2005-08-19 ENCOUNTER — Emergency Department (HOSPITAL_COMMUNITY): Admission: EM | Admit: 2005-08-19 | Discharge: 2005-08-19 | Payer: Self-pay | Admitting: Emergency Medicine

## 2005-08-30 ENCOUNTER — Emergency Department (HOSPITAL_COMMUNITY): Admission: EM | Admit: 2005-08-30 | Discharge: 2005-08-30 | Payer: Self-pay | Admitting: Emergency Medicine

## 2005-08-31 ENCOUNTER — Emergency Department (HOSPITAL_COMMUNITY): Admission: EM | Admit: 2005-08-31 | Discharge: 2005-08-31 | Payer: Self-pay | Admitting: Emergency Medicine

## 2005-09-04 ENCOUNTER — Inpatient Hospital Stay (HOSPITAL_COMMUNITY): Admission: EM | Admit: 2005-09-04 | Discharge: 2005-09-07 | Payer: Self-pay | Admitting: Emergency Medicine

## 2005-09-29 ENCOUNTER — Emergency Department (HOSPITAL_COMMUNITY): Admission: EM | Admit: 2005-09-29 | Discharge: 2005-09-29 | Payer: Self-pay | Admitting: Emergency Medicine

## 2005-10-13 ENCOUNTER — Emergency Department (HOSPITAL_COMMUNITY): Admission: EM | Admit: 2005-10-13 | Discharge: 2005-10-13 | Payer: Self-pay | Admitting: Emergency Medicine

## 2005-10-19 ENCOUNTER — Emergency Department (HOSPITAL_COMMUNITY): Admission: EM | Admit: 2005-10-19 | Discharge: 2005-10-19 | Payer: Self-pay | Admitting: Emergency Medicine

## 2005-10-31 ENCOUNTER — Emergency Department (HOSPITAL_COMMUNITY): Admission: EM | Admit: 2005-10-31 | Discharge: 2005-10-31 | Payer: Self-pay | Admitting: Emergency Medicine

## 2005-11-15 ENCOUNTER — Emergency Department (HOSPITAL_COMMUNITY): Admission: EM | Admit: 2005-11-15 | Discharge: 2005-11-15 | Payer: Self-pay | Admitting: Emergency Medicine

## 2005-11-21 ENCOUNTER — Emergency Department (HOSPITAL_COMMUNITY): Admission: EM | Admit: 2005-11-21 | Discharge: 2005-11-21 | Payer: Self-pay | Admitting: Emergency Medicine

## 2005-11-23 ENCOUNTER — Emergency Department (HOSPITAL_COMMUNITY): Admission: EM | Admit: 2005-11-23 | Discharge: 2005-11-23 | Payer: Self-pay | Admitting: Emergency Medicine

## 2005-11-25 ENCOUNTER — Emergency Department (HOSPITAL_COMMUNITY): Admission: EM | Admit: 2005-11-25 | Discharge: 2005-11-25 | Payer: Self-pay | Admitting: Emergency Medicine

## 2005-12-03 IMAGING — CR DG ABDOMEN ACUTE W/ 1V CHEST
4 series · 4 of 4 positions shown · non-contrast
Comparison: none

CLINICAL DATA: 52 year-old female with abdominal pain, with vomiting and constipation.
ACUTE ABDOMINAL SERIES, FOUR VIEWS WITH CHEST ? 05/21/2003
CHEST
The lungs are hyperexpanded with attenuation of the vasculature most consistent with COPD.  No active air space disease, edema, effusion or pneumothorax.  The heart size is normal.  No free air.
IMPRESSION
COPD without active disease.  No free air.
ABDOMEN
Scattered stool and air are evident throughout the colon, consistent with mild constipation.  No definite obstruction or free air.  Postoperative clips are present over the right SI joint.
1. No evidence of bowel obstruction.
2. Constipation.
3. No free air.

[view not recorded (1 of 4)]
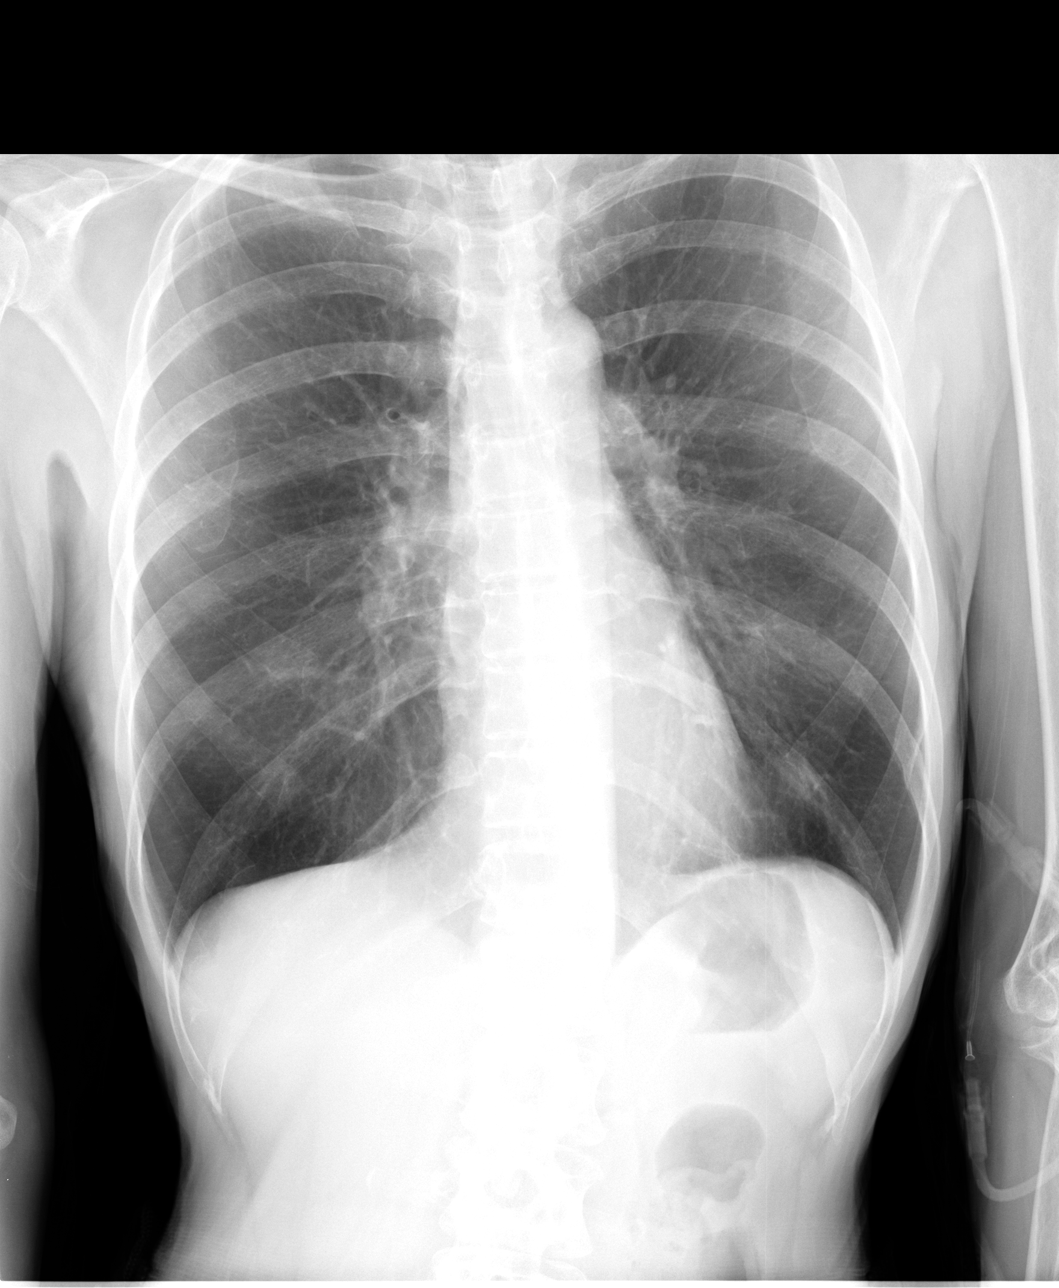

[view not recorded (2 of 4)]
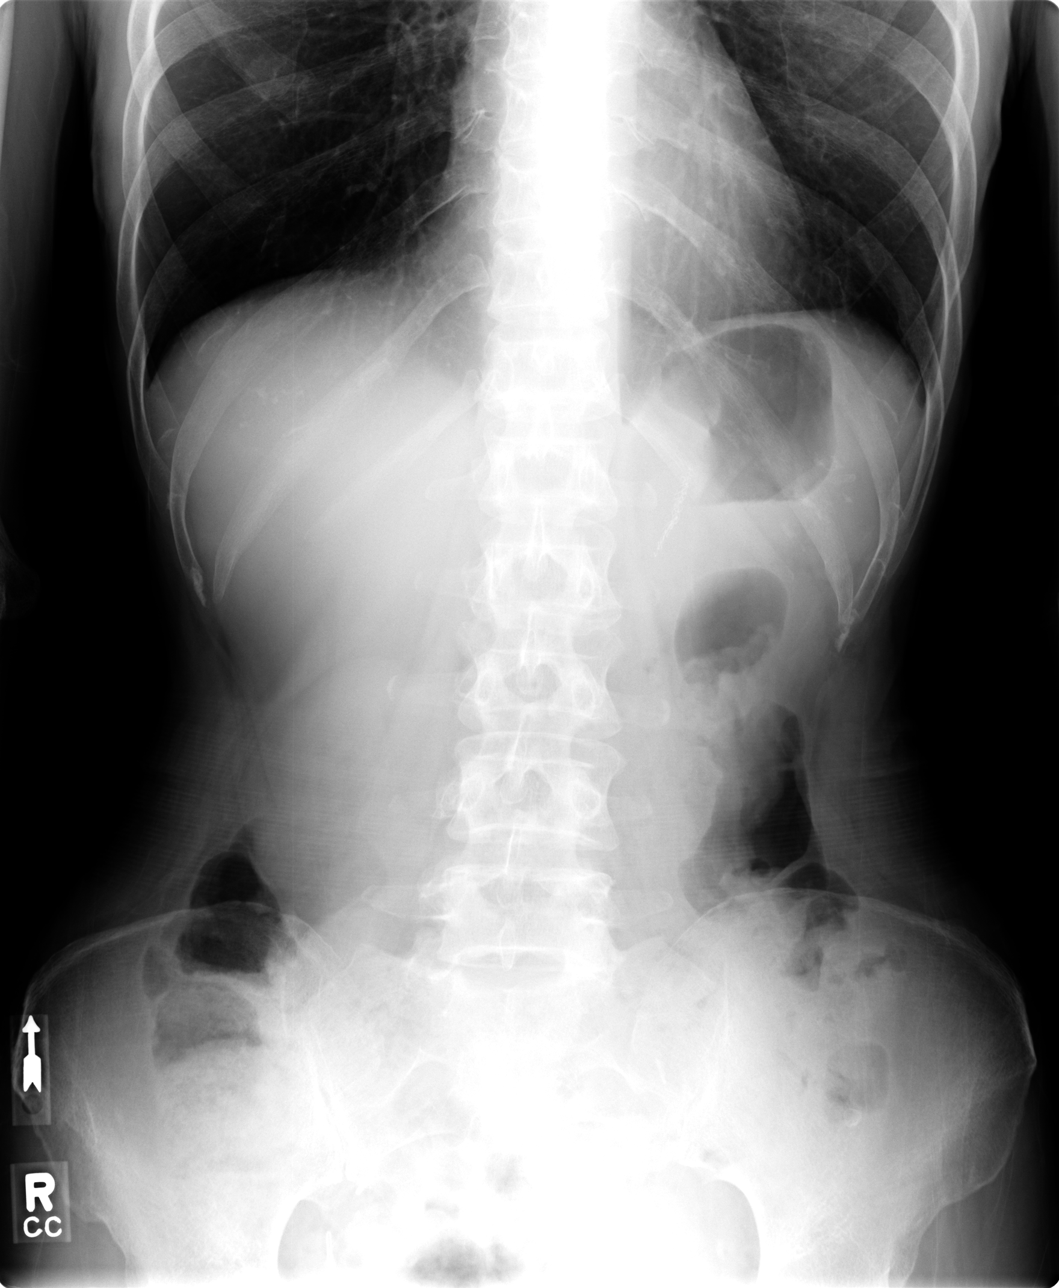

[view not recorded (3 of 4)]
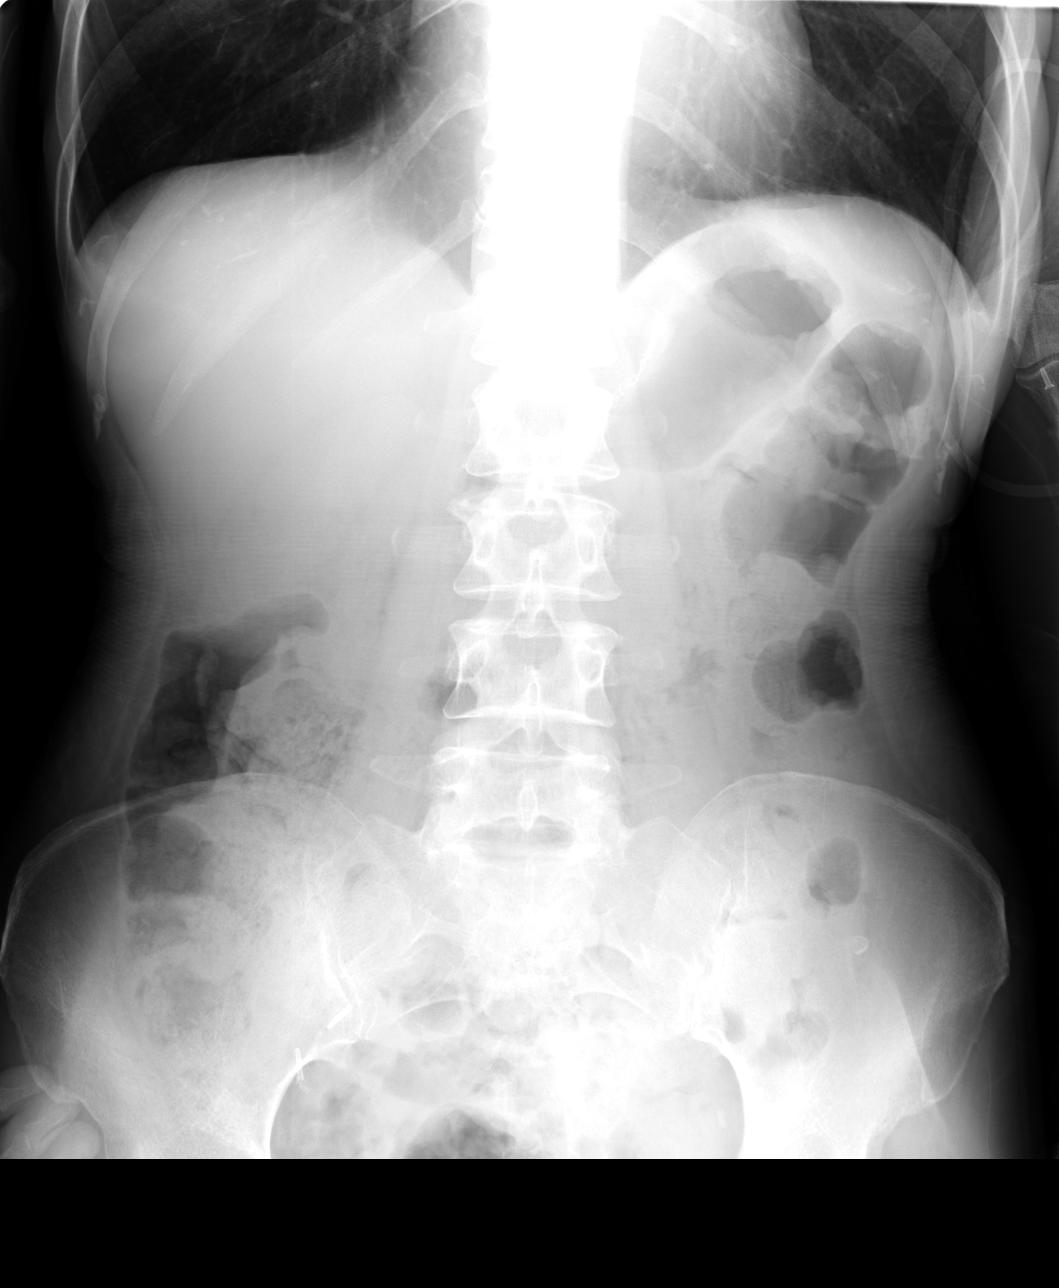

[view not recorded (4 of 4)]
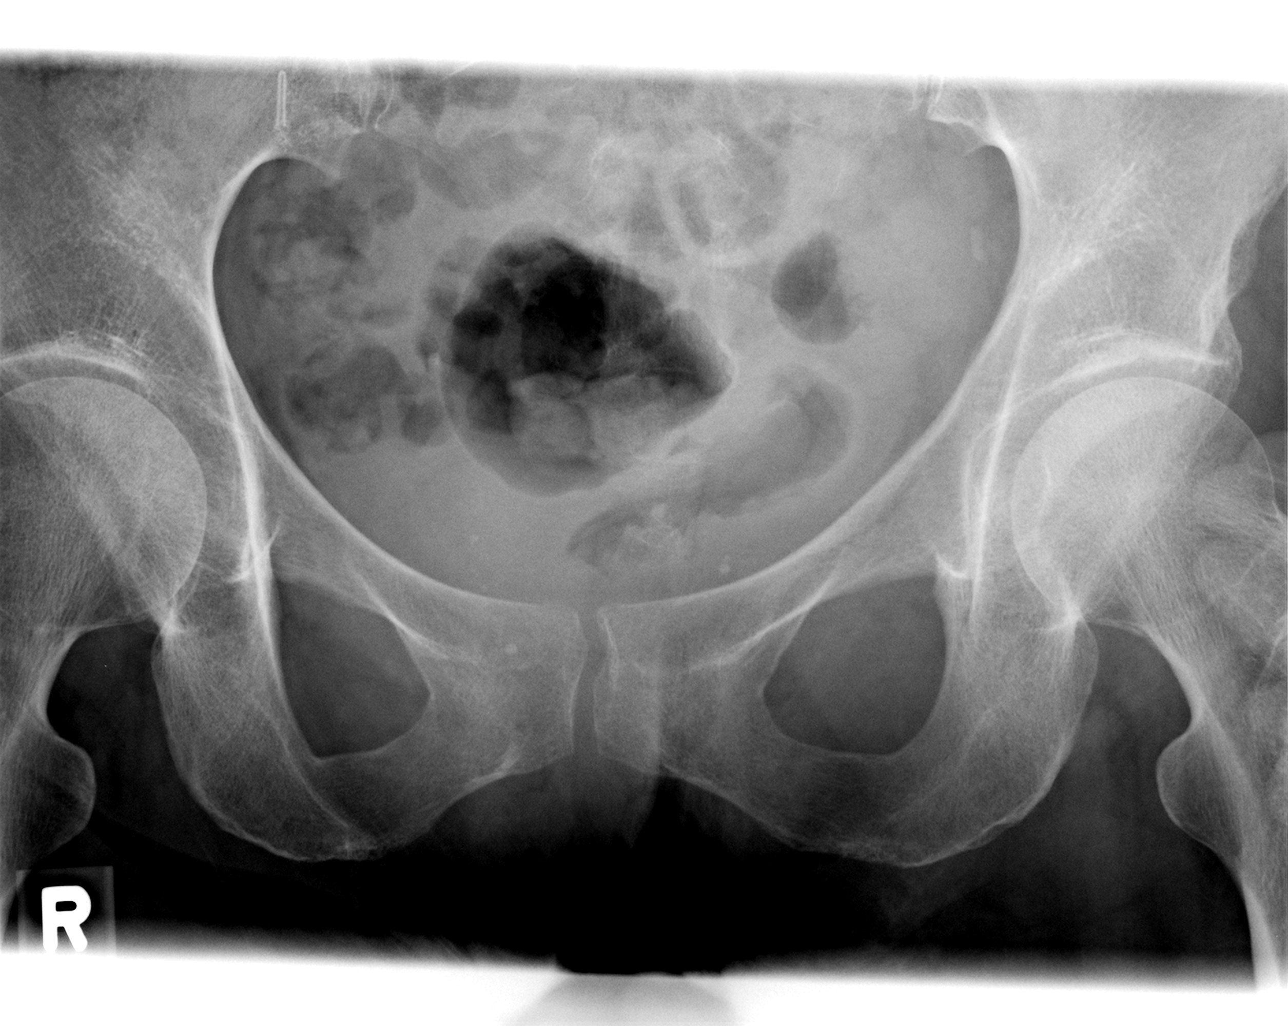

[4 of 4 positions shown; findings below may reference images not displayed]

## 2005-12-13 ENCOUNTER — Emergency Department (HOSPITAL_COMMUNITY): Admission: EM | Admit: 2005-12-13 | Discharge: 2005-12-13 | Payer: Self-pay | Admitting: Emergency Medicine

## 2005-12-16 ENCOUNTER — Emergency Department (HOSPITAL_COMMUNITY): Admission: EM | Admit: 2005-12-16 | Discharge: 2005-12-16 | Payer: Self-pay | Admitting: Emergency Medicine

## 2006-01-01 ENCOUNTER — Emergency Department (HOSPITAL_COMMUNITY): Admission: EM | Admit: 2006-01-01 | Discharge: 2006-01-01 | Payer: Self-pay | Admitting: Emergency Medicine

## 2006-01-14 ENCOUNTER — Emergency Department (HOSPITAL_COMMUNITY): Admission: EM | Admit: 2006-01-14 | Discharge: 2006-01-14 | Payer: Self-pay | Admitting: Emergency Medicine

## 2006-01-15 IMAGING — CR DG ABDOMEN ACUTE W/ 1V CHEST
3 series · 3 of 3 positions shown · non-contrast
Comparison: none

CLINICAL DATA: Abdominal pain.  
 ACUTE ABDOMEN SERIES 07/03/03 
 Comparing 05/21/03.

[view not recorded (1 of 3)]
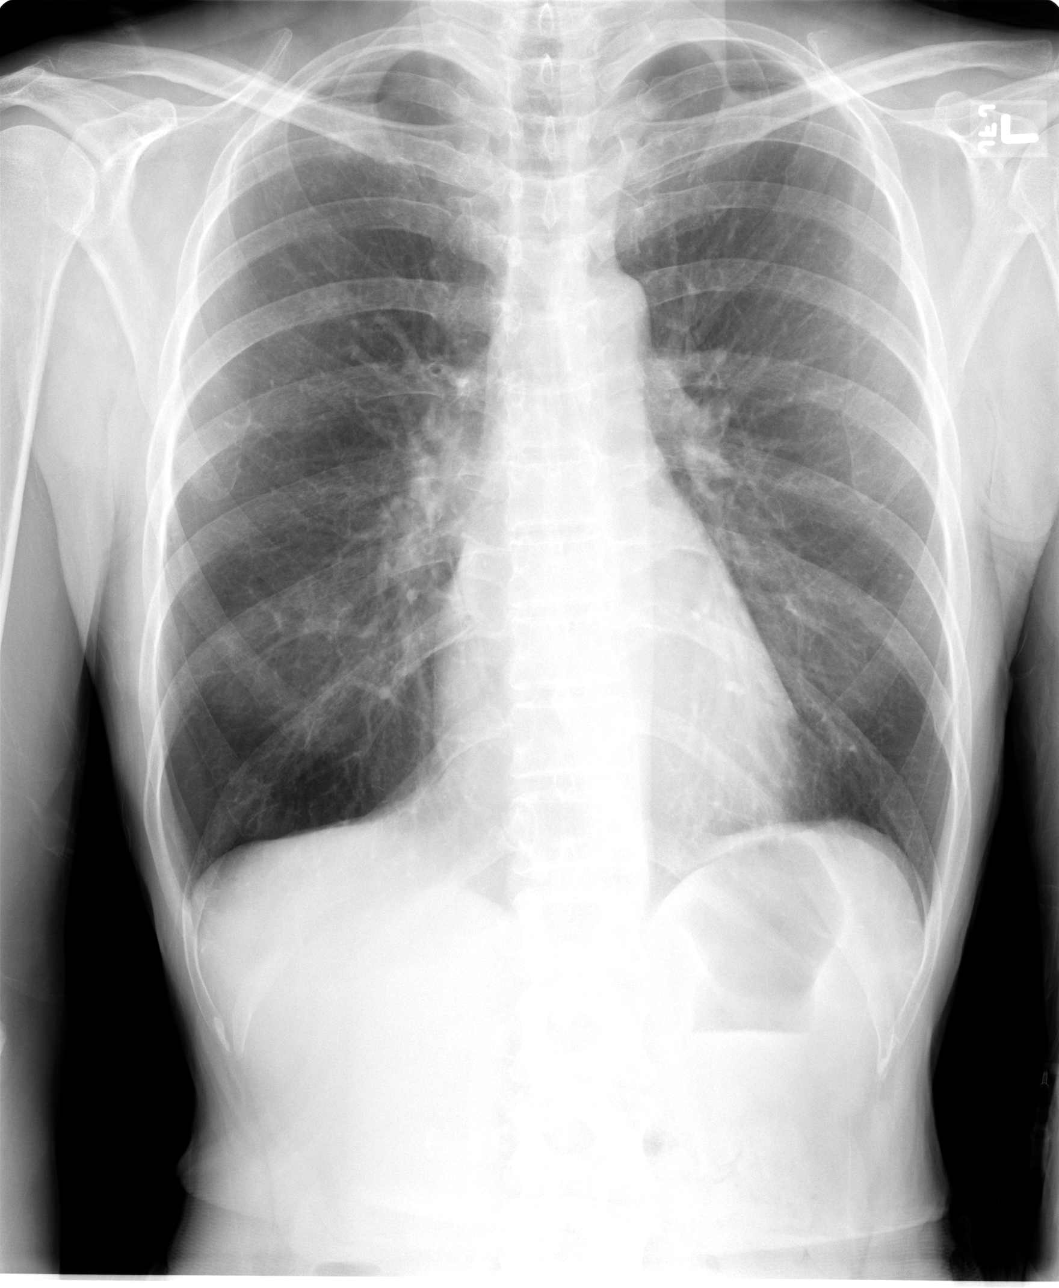

[view not recorded (2 of 3)]
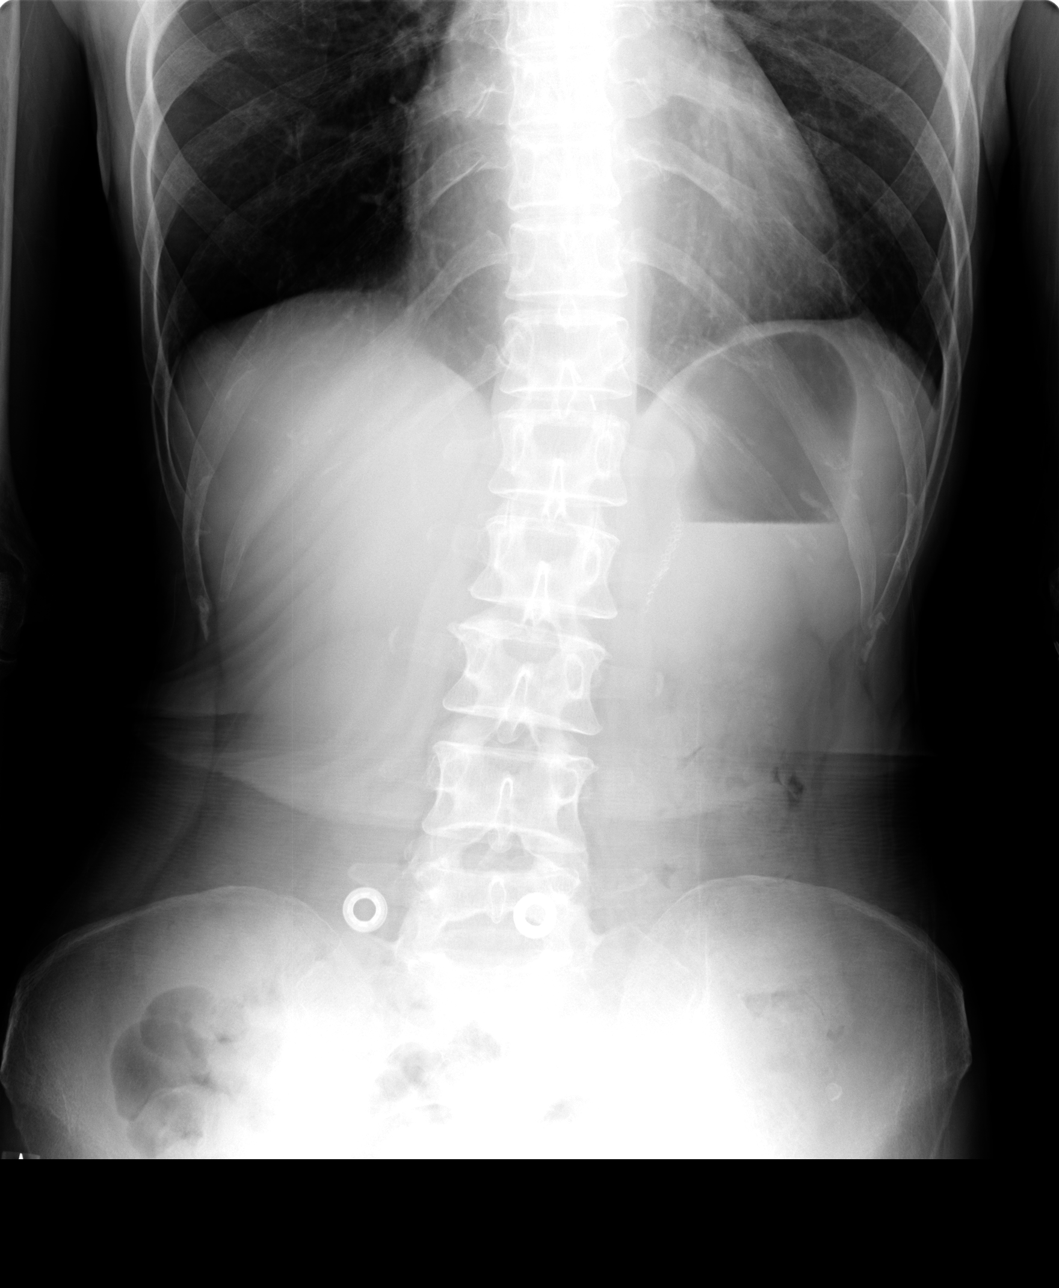

[view not recorded (3 of 3)]
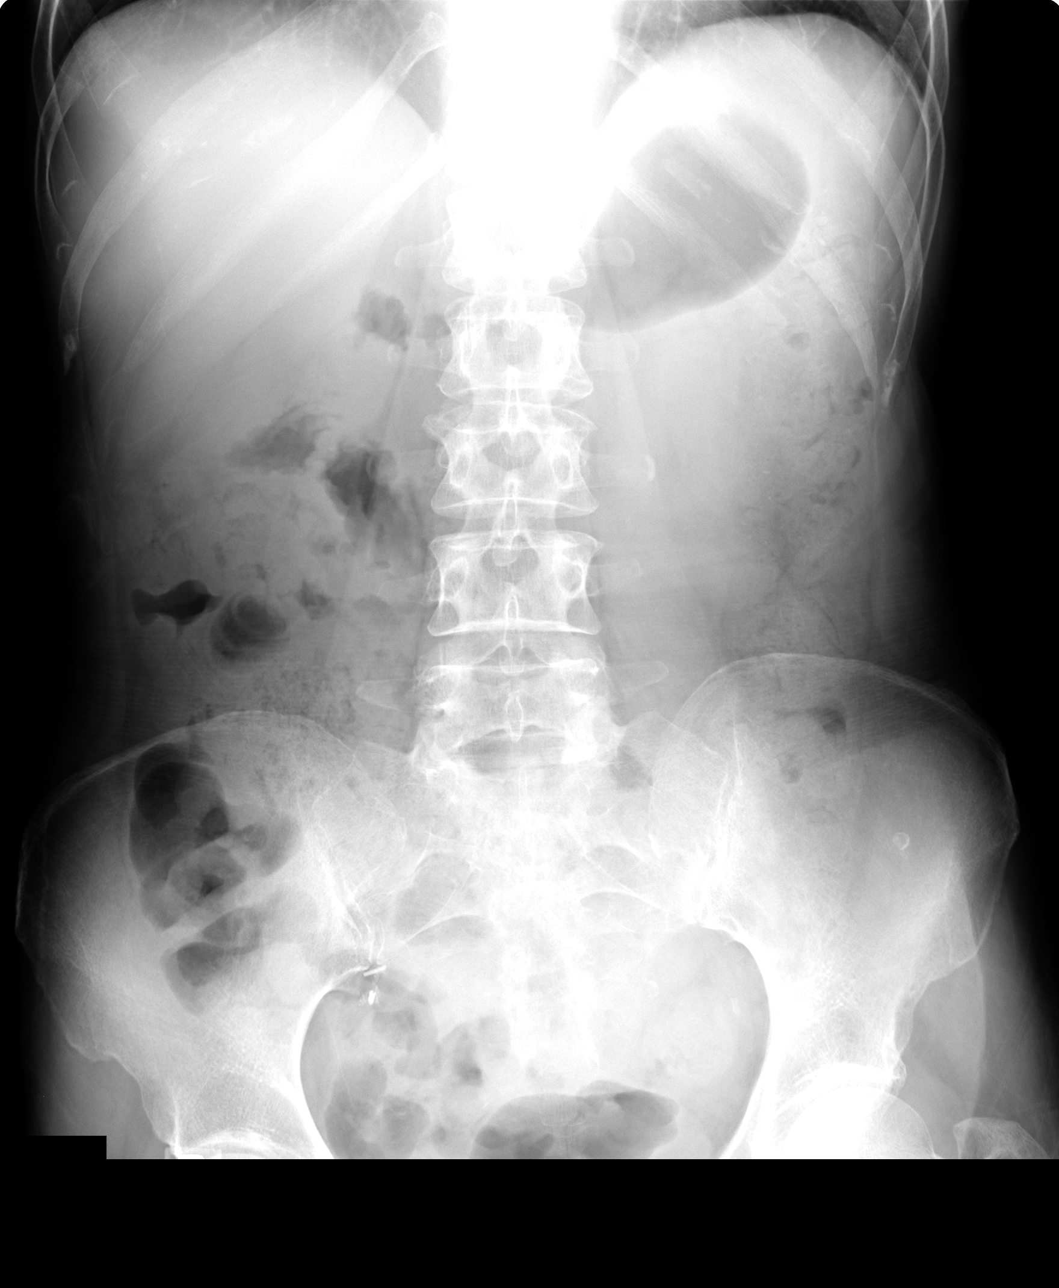

[3 of 3 positions shown; findings below may reference images not displayed]

FINDINGS: A written preliminary report was provided by Dr. Nishimura for this exam.  His dictation of the exam was lost due to computer malfunction.  Thus the exam is being re-dictated today.  
 There is moderate to severe emphysema.  Gas and stool are present within the colon.  No dilated bowel.  No significant abnormal calcific density is identified.  
 IMPRESSION
 1.  COPD.  No acute findings.

## 2006-01-19 ENCOUNTER — Emergency Department (HOSPITAL_COMMUNITY): Admission: EM | Admit: 2006-01-19 | Discharge: 2006-01-19 | Payer: Self-pay | Admitting: Emergency Medicine

## 2006-02-10 ENCOUNTER — Emergency Department (HOSPITAL_COMMUNITY): Admission: EM | Admit: 2006-02-10 | Discharge: 2006-02-10 | Payer: Self-pay | Admitting: Emergency Medicine

## 2006-03-08 ENCOUNTER — Inpatient Hospital Stay (HOSPITAL_COMMUNITY): Admission: EM | Admit: 2006-03-08 | Discharge: 2006-03-11 | Payer: Self-pay | Admitting: Emergency Medicine

## 2006-04-14 ENCOUNTER — Emergency Department (HOSPITAL_COMMUNITY): Admission: EM | Admit: 2006-04-14 | Discharge: 2006-04-14 | Payer: Self-pay | Admitting: Emergency Medicine

## 2006-04-29 ENCOUNTER — Emergency Department (HOSPITAL_COMMUNITY): Admission: EM | Admit: 2006-04-29 | Discharge: 2006-04-29 | Payer: Self-pay | Admitting: Emergency Medicine

## 2006-05-01 ENCOUNTER — Emergency Department (HOSPITAL_COMMUNITY): Admission: EM | Admit: 2006-05-01 | Discharge: 2006-05-01 | Payer: Self-pay | Admitting: Emergency Medicine

## 2006-05-02 ENCOUNTER — Inpatient Hospital Stay (HOSPITAL_COMMUNITY): Admission: EM | Admit: 2006-05-02 | Discharge: 2006-05-09 | Payer: Self-pay | Admitting: Emergency Medicine

## 2006-05-20 ENCOUNTER — Emergency Department (HOSPITAL_COMMUNITY): Admission: EM | Admit: 2006-05-20 | Discharge: 2006-05-20 | Payer: Self-pay | Admitting: Emergency Medicine

## 2006-05-21 ENCOUNTER — Emergency Department (HOSPITAL_COMMUNITY): Admission: EM | Admit: 2006-05-21 | Discharge: 2006-05-21 | Payer: Self-pay | Admitting: Emergency Medicine

## 2006-06-13 ENCOUNTER — Emergency Department (HOSPITAL_COMMUNITY): Admission: EM | Admit: 2006-06-13 | Discharge: 2006-06-13 | Payer: Self-pay | Admitting: Emergency Medicine

## 2006-06-18 ENCOUNTER — Emergency Department (HOSPITAL_COMMUNITY): Admission: EM | Admit: 2006-06-18 | Discharge: 2006-06-18 | Payer: Self-pay | Admitting: Emergency Medicine

## 2006-06-29 ENCOUNTER — Emergency Department (HOSPITAL_COMMUNITY): Admission: EM | Admit: 2006-06-29 | Discharge: 2006-06-29 | Payer: Self-pay | Admitting: Emergency Medicine

## 2006-07-31 ENCOUNTER — Inpatient Hospital Stay (HOSPITAL_COMMUNITY): Admission: EM | Admit: 2006-07-31 | Discharge: 2006-08-04 | Payer: Self-pay | Admitting: Emergency Medicine

## 2006-08-01 ENCOUNTER — Ambulatory Visit: Payer: Self-pay | Admitting: Physical Medicine & Rehabilitation

## 2006-10-20 ENCOUNTER — Emergency Department (HOSPITAL_COMMUNITY): Admission: EM | Admit: 2006-10-20 | Discharge: 2006-10-20 | Payer: Self-pay | Admitting: Emergency Medicine

## 2006-11-10 ENCOUNTER — Emergency Department (HOSPITAL_COMMUNITY): Admission: EM | Admit: 2006-11-10 | Discharge: 2006-11-10 | Payer: Self-pay | Admitting: Emergency Medicine

## 2006-11-16 ENCOUNTER — Emergency Department (HOSPITAL_COMMUNITY): Admission: EM | Admit: 2006-11-16 | Discharge: 2006-11-16 | Payer: Self-pay | Admitting: Emergency Medicine

## 2006-11-17 ENCOUNTER — Observation Stay (HOSPITAL_COMMUNITY): Admission: EM | Admit: 2006-11-17 | Discharge: 2006-11-18 | Payer: Self-pay | Admitting: Emergency Medicine

## 2006-11-19 ENCOUNTER — Emergency Department (HOSPITAL_COMMUNITY): Admission: EM | Admit: 2006-11-19 | Discharge: 2006-11-19 | Payer: Self-pay | Admitting: Emergency Medicine

## 2006-11-25 ENCOUNTER — Emergency Department (HOSPITAL_COMMUNITY): Admission: EM | Admit: 2006-11-25 | Discharge: 2006-11-25 | Payer: Self-pay | Admitting: Emergency Medicine

## 2006-12-13 ENCOUNTER — Encounter: Admission: RE | Admit: 2006-12-13 | Discharge: 2006-12-13 | Payer: Self-pay | Admitting: Urology

## 2006-12-21 ENCOUNTER — Emergency Department (HOSPITAL_COMMUNITY): Admission: EM | Admit: 2006-12-21 | Discharge: 2006-12-21 | Payer: Self-pay | Admitting: Emergency Medicine

## 2007-01-13 ENCOUNTER — Emergency Department (HOSPITAL_COMMUNITY): Admission: EM | Admit: 2007-01-13 | Discharge: 2007-01-13 | Payer: Self-pay | Admitting: Emergency Medicine

## 2007-01-15 ENCOUNTER — Inpatient Hospital Stay (HOSPITAL_COMMUNITY): Admission: EM | Admit: 2007-01-15 | Discharge: 2007-01-20 | Payer: Self-pay | Admitting: Emergency Medicine

## 2007-01-18 ENCOUNTER — Encounter: Payer: Self-pay | Admitting: Interventional Radiology

## 2007-01-18 ENCOUNTER — Encounter (INDEPENDENT_AMBULATORY_CARE_PROVIDER_SITE_OTHER): Payer: Self-pay | Admitting: Interventional Radiology

## 2007-02-07 ENCOUNTER — Emergency Department (HOSPITAL_COMMUNITY): Admission: EM | Admit: 2007-02-07 | Discharge: 2007-02-07 | Payer: Self-pay | Admitting: Emergency Medicine

## 2007-02-09 IMAGING — CT CT HEAD W/O CM
1 series · 16 of 30 positions shown, 20 images · IV contrast (agent unspecified)
Comparison: none

CLINICAL DATA: Slurred speech. Shaking. 
 CT HEAD WITHOUT CONTRAST:
TECHNIQUE: 5 mm collimated images were obtained from the base of the skull through the vertex according to standard protocol without contrast.
 The brain shows minimal atrophy in a generalized fashion.  No evidence of focal stroke, mass, hemorrhage, hydrocephalus, or extraaxial collection. calvarium is unremarkable.  The sinuses are clear.

[Series 2: head_seq 4.5 h42s st · axial · 0.43mm/px · z∈[-145,-19]mm · 16 of 32 slices shown, 20 images]
[im 2/32  brain]
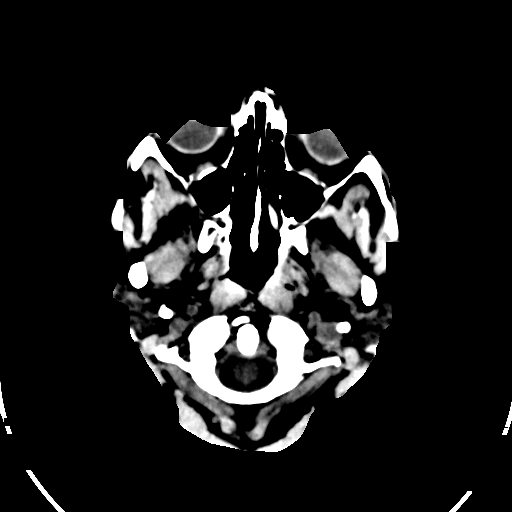
[im 2/32  bone]
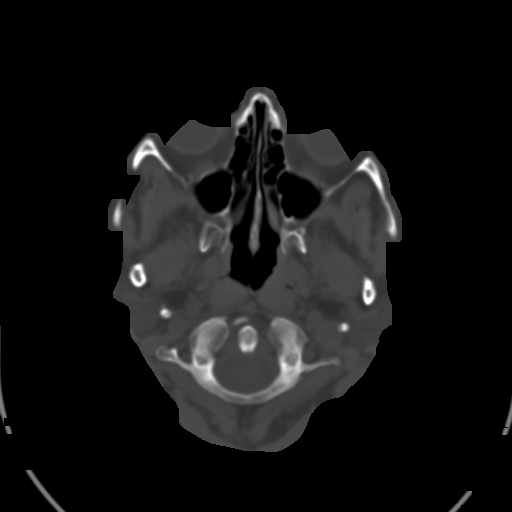
[im 4/32  brain]
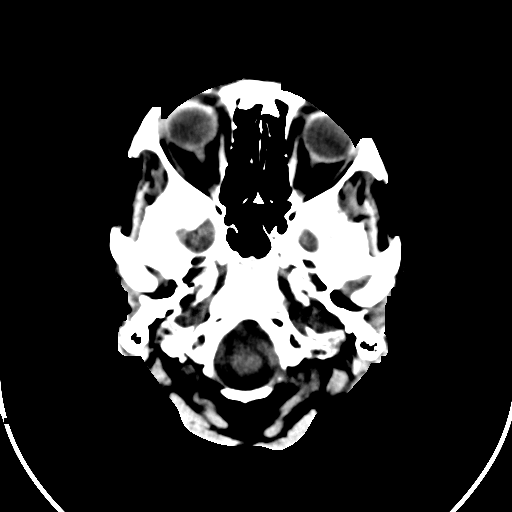
[im 6/32  brain]
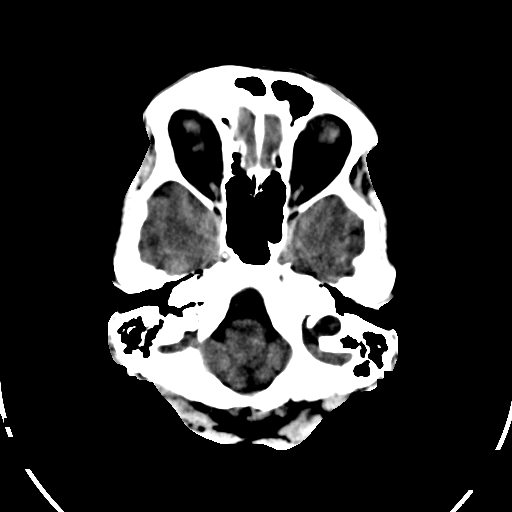
[im 8/32  brain]
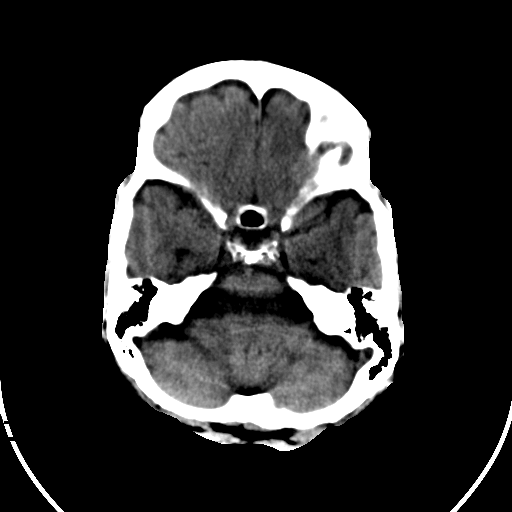
[im 9/32  brain]
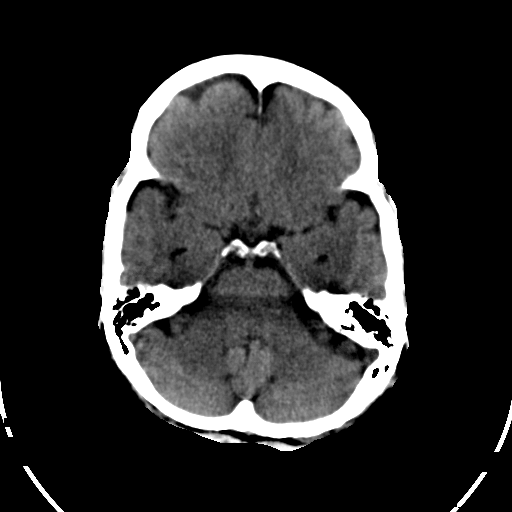
[im 9/32  bone]
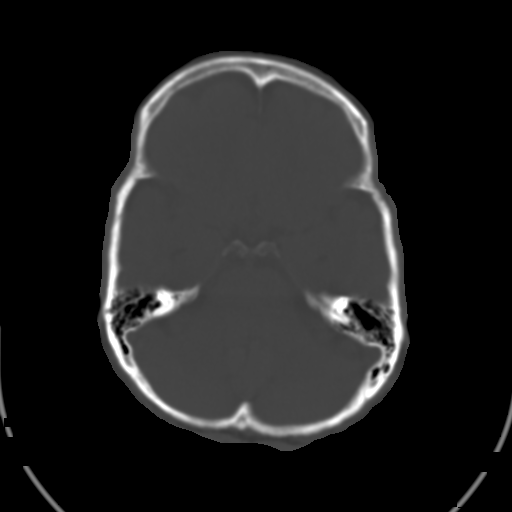
[im 11/32  brain]
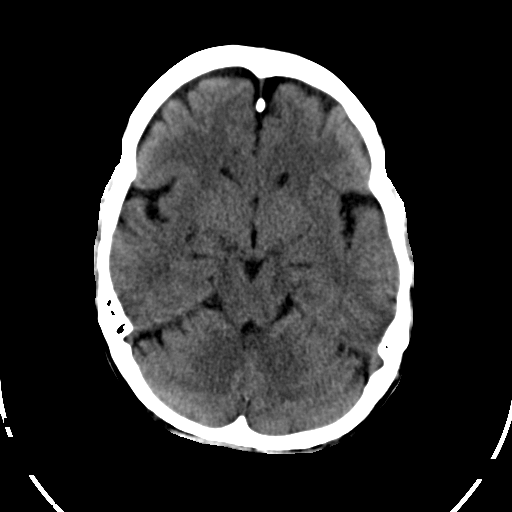
[im 13/32  brain]
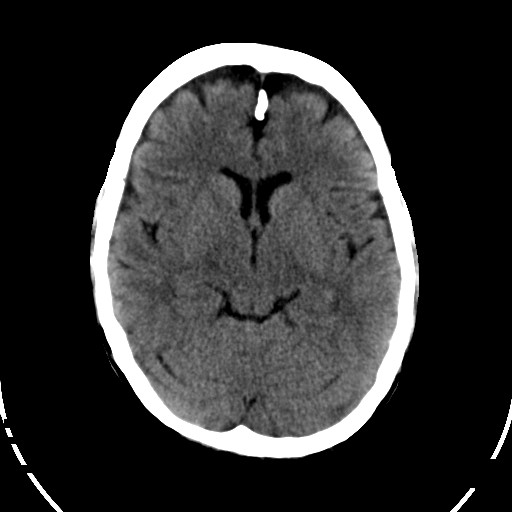
[im 15/32  brain]
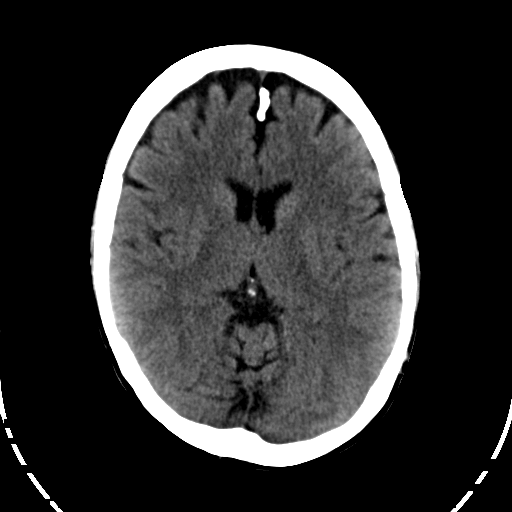
[im 17/32  brain]
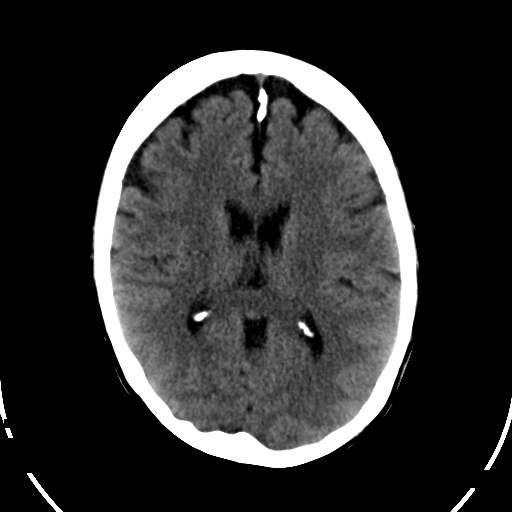
[im 17/32  bone]
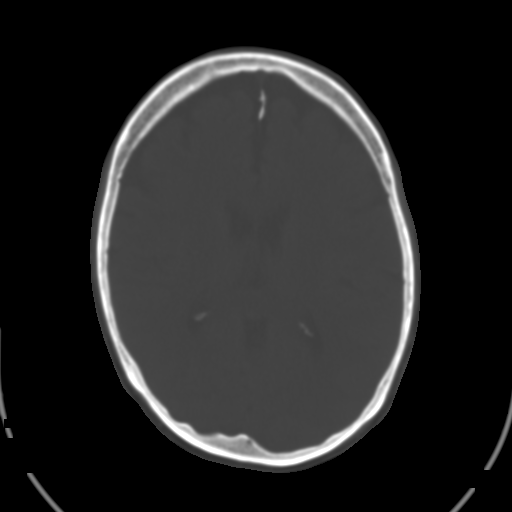
[im 19/32  brain]
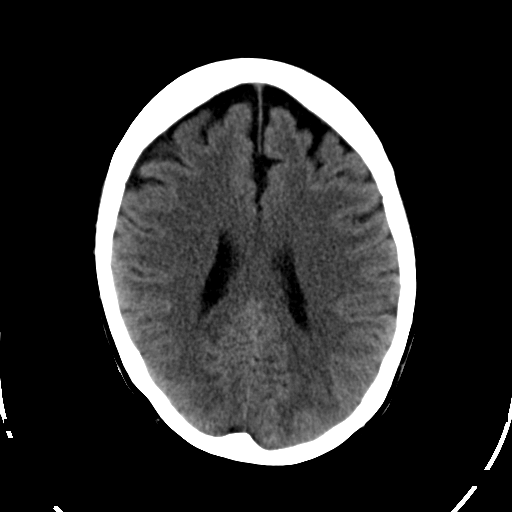
[im 21/32  brain]
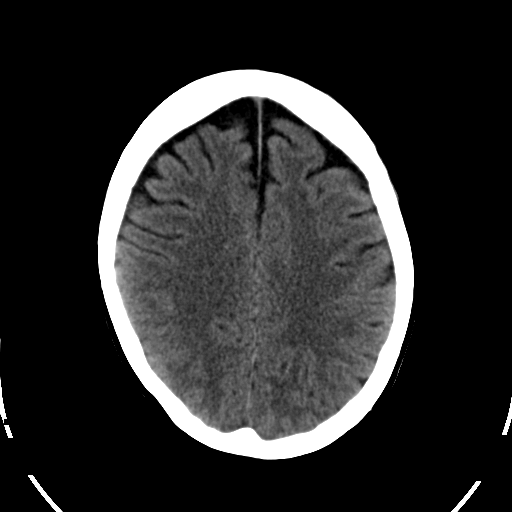
[im 23/32  brain]
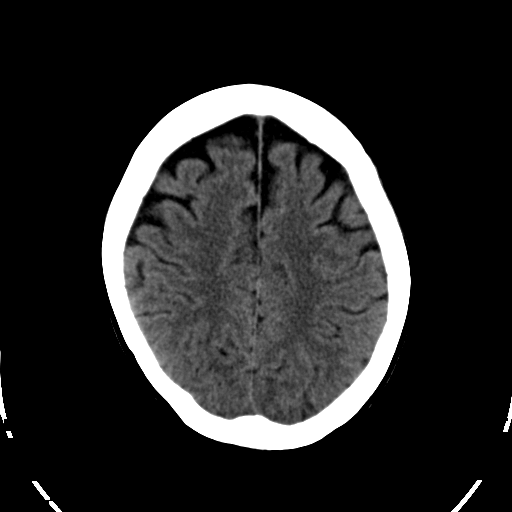
[im 24/32  brain]
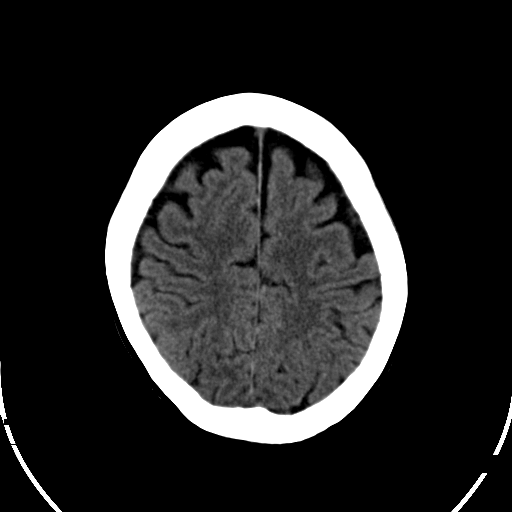
[im 24/32  bone]
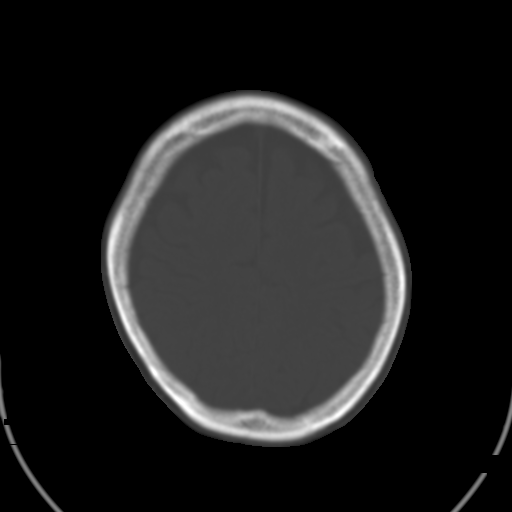
[im 26/32  brain]
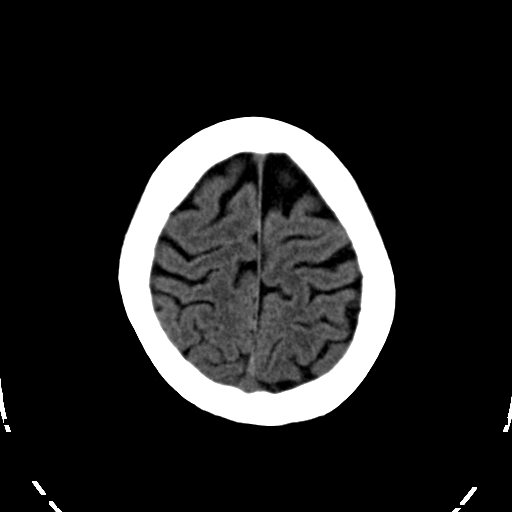
[im 28/32  brain]
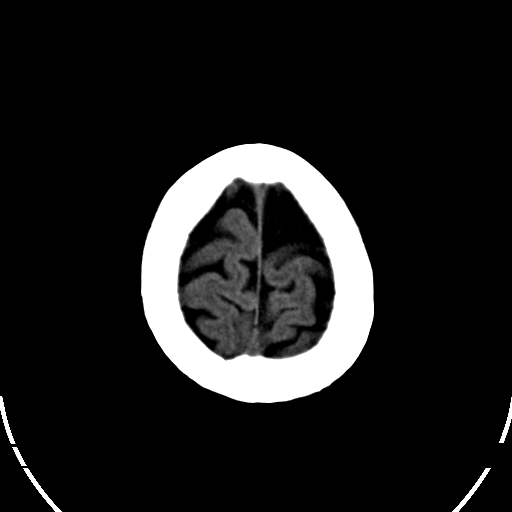
[im 30/32  brain]
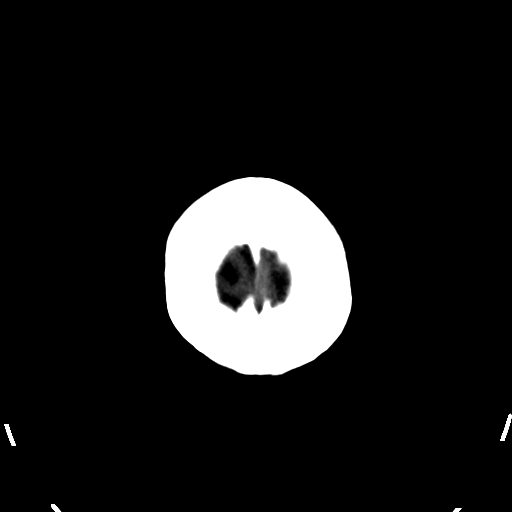

[16 of 30 positions shown; findings below may reference images not displayed]

IMPRESSION: Negative head CT.

## 2007-02-22 ENCOUNTER — Encounter: Admission: RE | Admit: 2007-02-22 | Discharge: 2007-02-22 | Payer: Self-pay | Admitting: Interventional Radiology

## 2007-03-11 ENCOUNTER — Emergency Department (HOSPITAL_COMMUNITY): Admission: EM | Admit: 2007-03-11 | Discharge: 2007-03-12 | Payer: Self-pay | Admitting: Emergency Medicine

## 2007-03-17 ENCOUNTER — Encounter: Admission: RE | Admit: 2007-03-17 | Discharge: 2007-03-17 | Payer: Self-pay | Admitting: Orthopedic Surgery

## 2007-03-25 IMAGING — CR DG ABDOMEN ACUTE W/ 1V CHEST
3 series · 3 of 3 positions shown · non-contrast
Comparison: 07/03/2003

CLINICAL DATA: Abdominal pain.

[w chest pa]
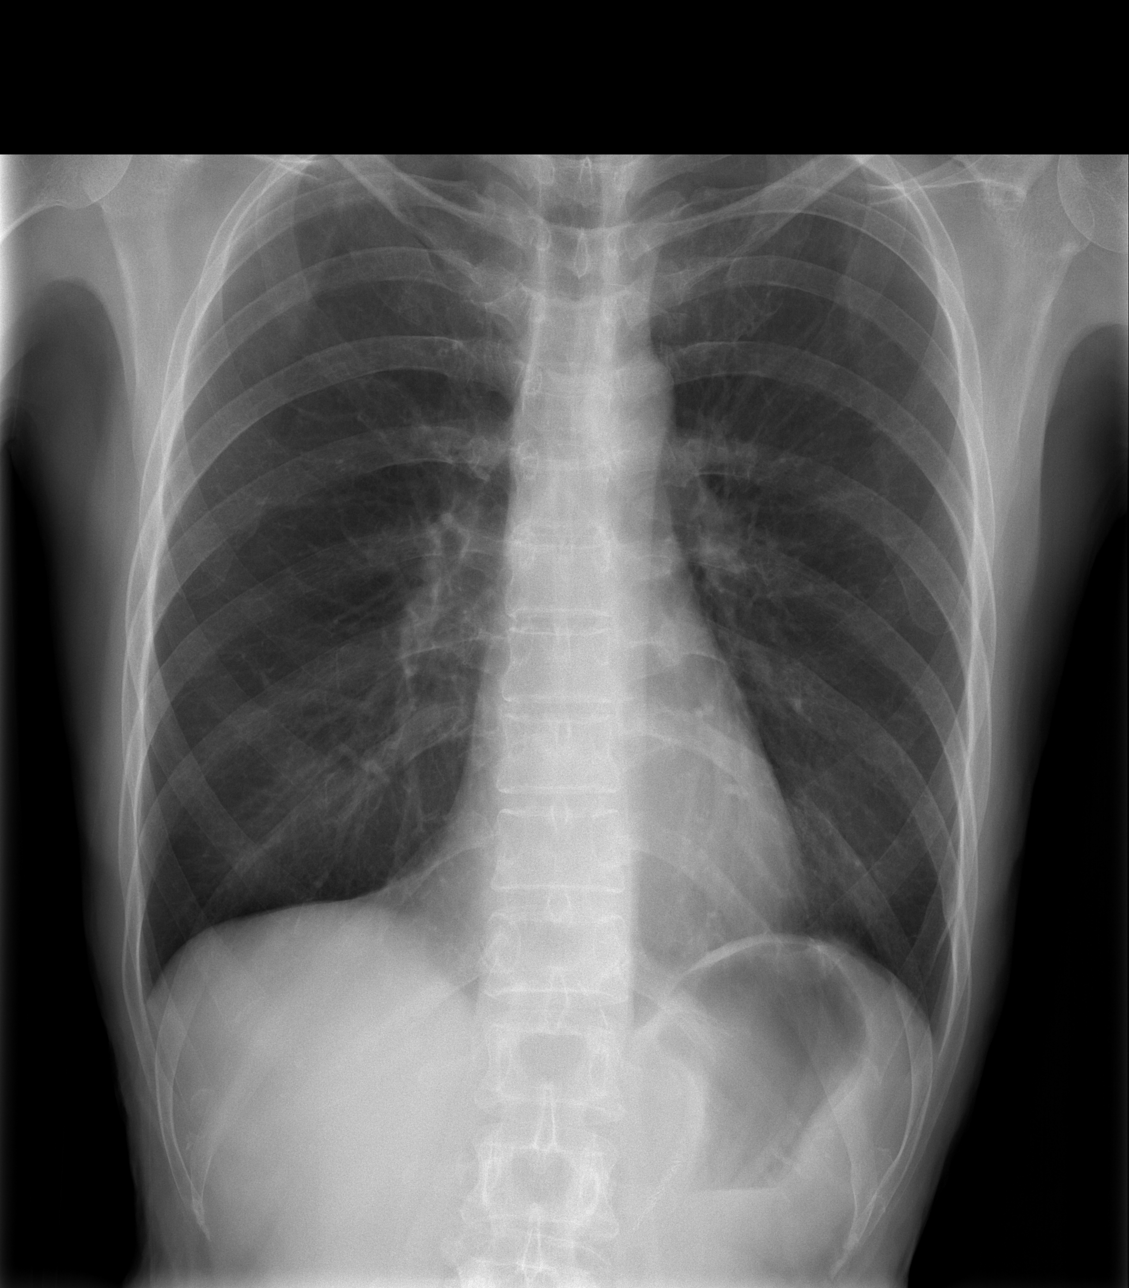

[w abdomen upright *]
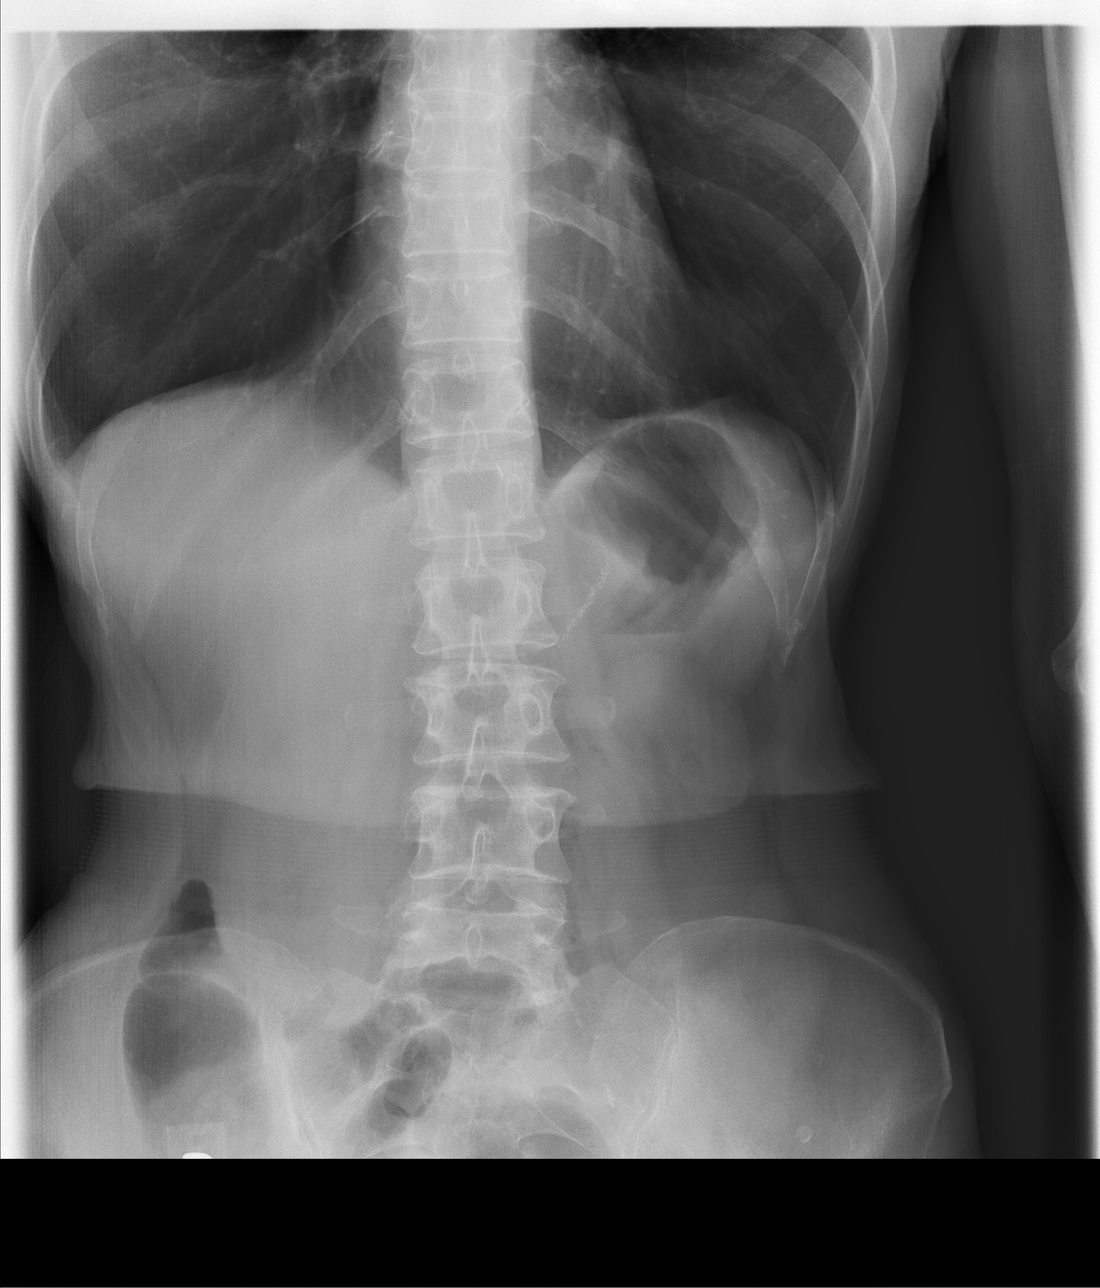

[t abdomen supine]
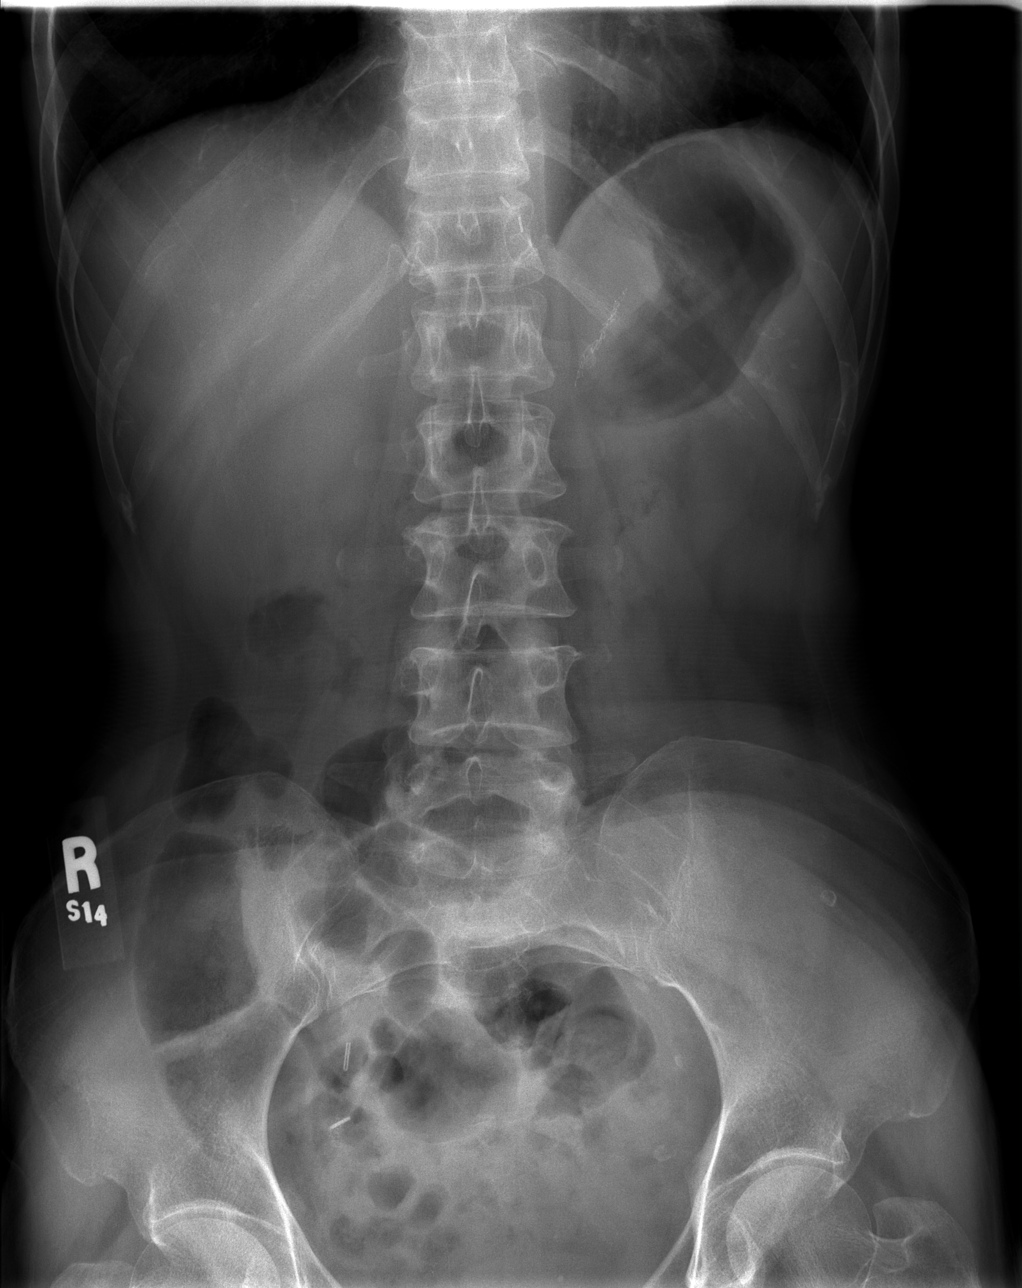

[3 of 3 positions shown; findings below may reference images not displayed]

ABDOMEN SERIES - 2 VIEW & CHEST - 1 VIEW:

Lesser hyperinflated. No focal consolidation or pulmonary edema. No evidence for
pleural effusion. Heart size is normal.

Supine and upright views of the abdomen are without evidence for intraperitoneal
free air.  Bowel gas pattern is nonspecific.  Imaged bony structures are
unremarkable.  surgical suture material projects in the medial left upper
quadrant and surgical clips are noted in the right anatomic pelvis.
IMPRESSION: Emphysema without acute cardiopulmonary process.

No evidence for bowel perforation or obstruction.

## 2007-04-02 ENCOUNTER — Emergency Department (HOSPITAL_COMMUNITY): Admission: EM | Admit: 2007-04-02 | Discharge: 2007-04-02 | Payer: Self-pay | Admitting: Emergency Medicine

## 2007-05-02 ENCOUNTER — Encounter: Admission: RE | Admit: 2007-05-02 | Discharge: 2007-05-02 | Payer: Self-pay | Admitting: Interventional Radiology

## 2007-05-08 ENCOUNTER — Emergency Department (HOSPITAL_COMMUNITY): Admission: EM | Admit: 2007-05-08 | Discharge: 2007-05-08 | Payer: Self-pay | Admitting: Emergency Medicine

## 2007-05-18 IMAGING — CR DG ABDOMEN ACUTE W/ 1V CHEST
4 series · 4 of 4 positions shown · non-contrast
Comparison: none

CLINICAL DATA: Abdominal pain, nausea, and vomiting.
 ACUTE ABDOMINAL SERIES WITH CHEST:

[view not recorded (1 of 4)]
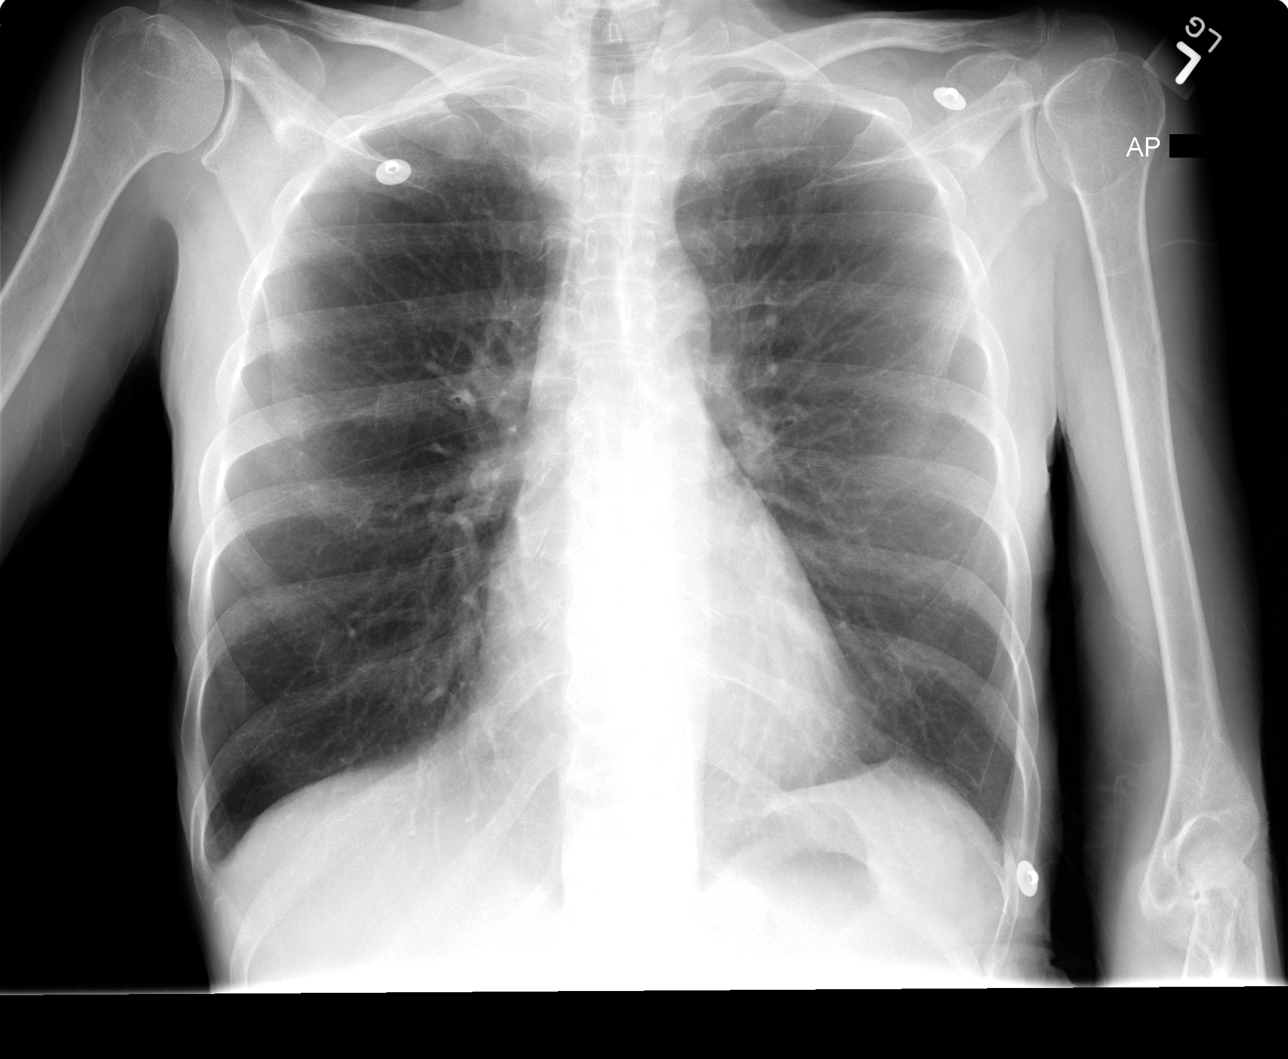

[view not recorded (2 of 4)]
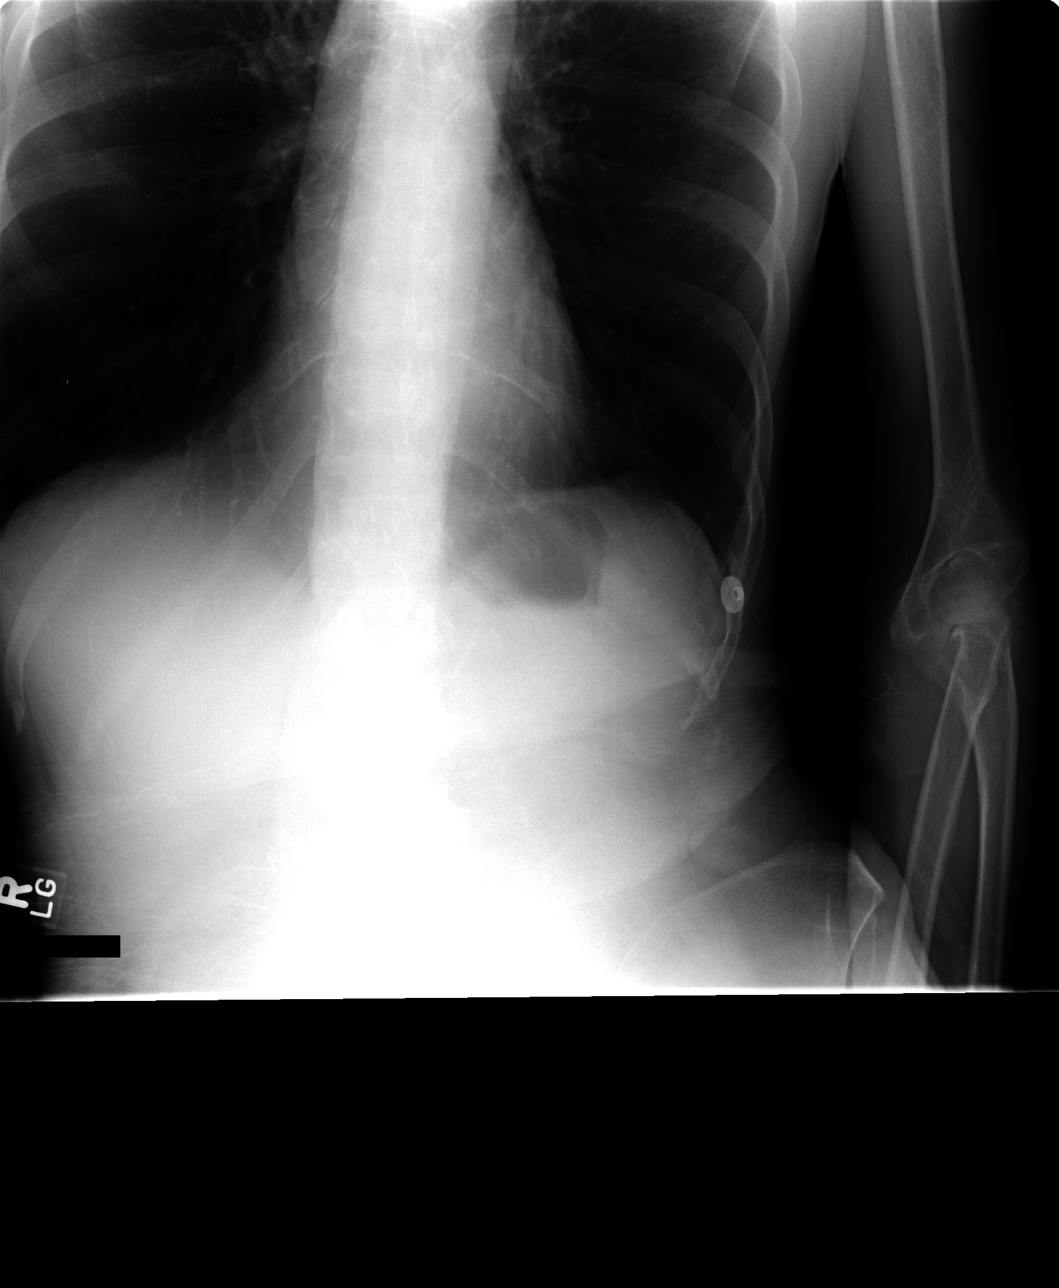

[view not recorded (3 of 4)]
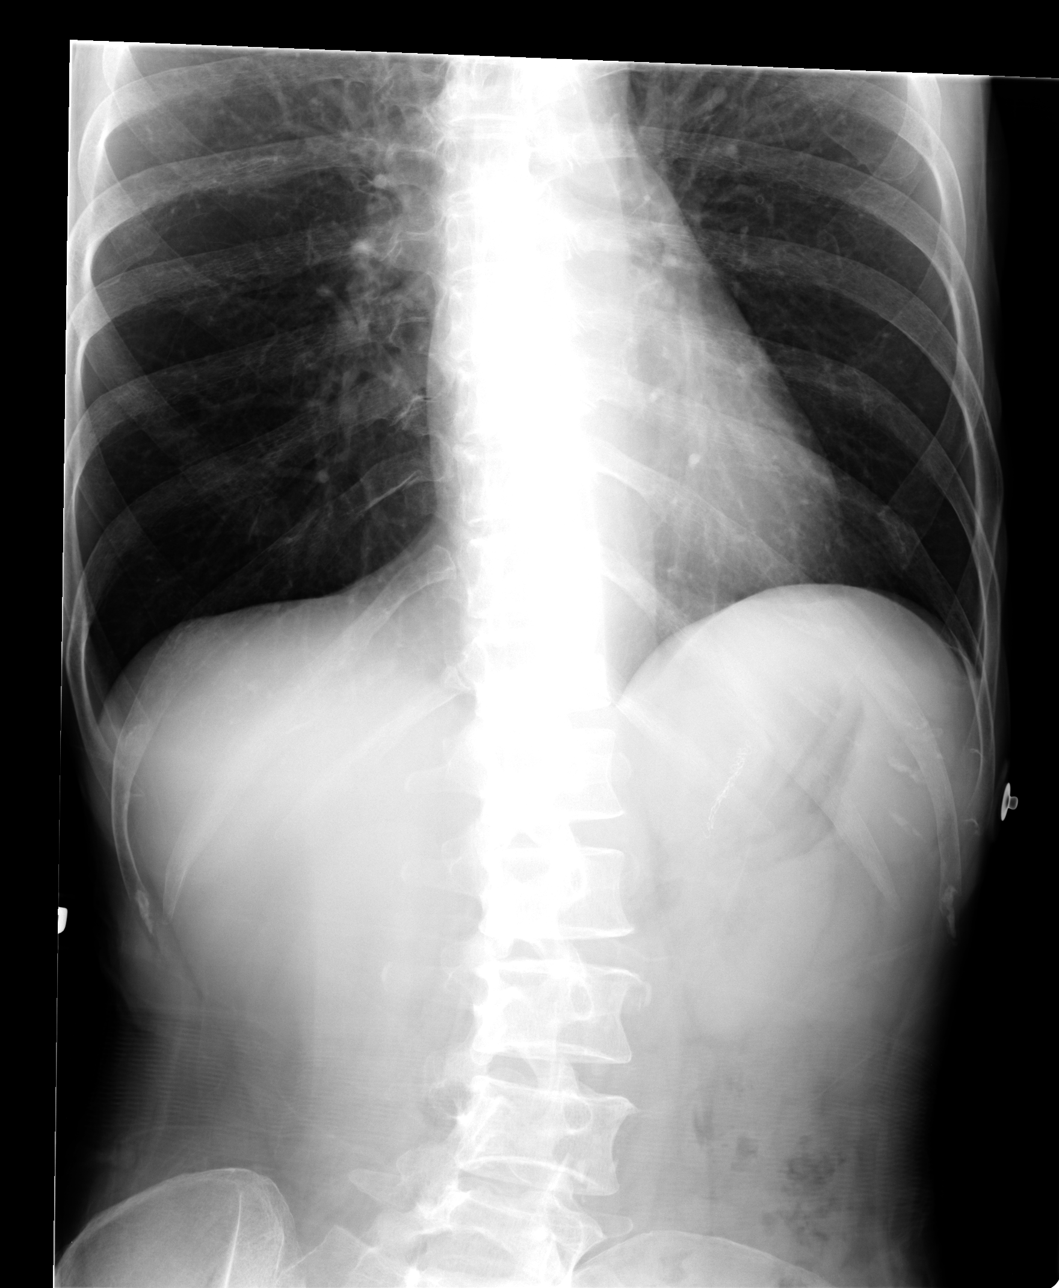

[view not recorded (4 of 4)]
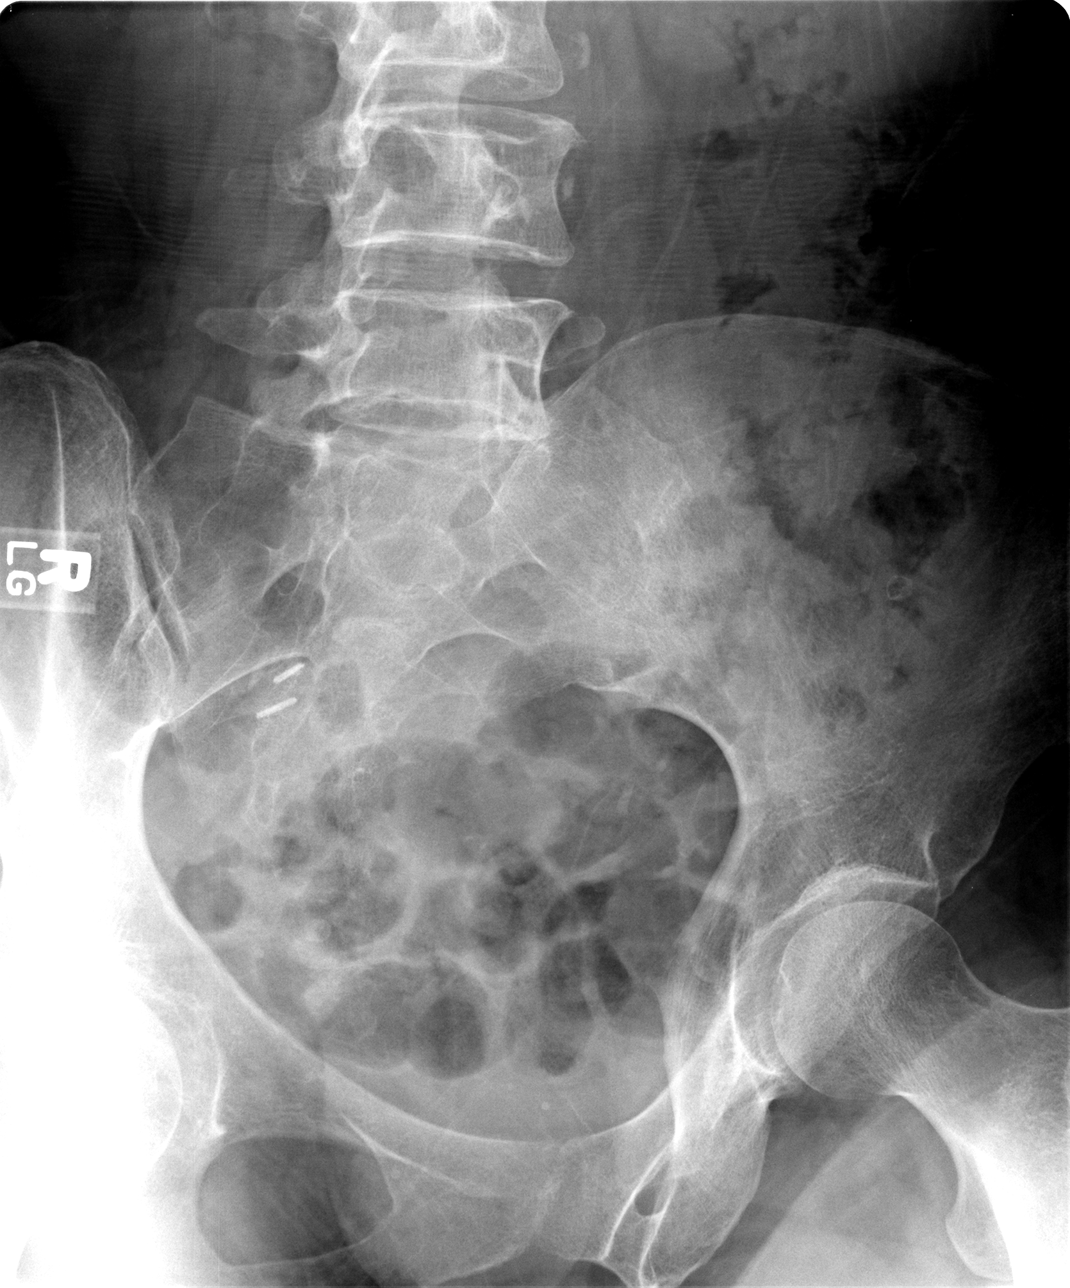

[4 of 4 positions shown; findings below may reference images not displayed]

FINDINGS: The chest x-ray shows hyperinflation and chronic lung disease similar to the prior study of 09/09/04.  There is no infiltrate or effusion.
 Flat and upright views of the abdomen reveal a normal bowel gas pattern without obstruction.  No free air.  There are clips around the stomach.
IMPRESSION: COPD.  no acute abnormality.
 Nonobstructive bowel gas pattern.

## 2007-06-05 ENCOUNTER — Emergency Department (HOSPITAL_COMMUNITY): Admission: EM | Admit: 2007-06-05 | Discharge: 2007-06-06 | Payer: Self-pay | Admitting: Emergency Medicine

## 2007-06-18 IMAGING — CR DG ABDOMEN ACUTE W/ 1V CHEST
3 series · 3 of 3 positions shown · non-contrast
Comparison: none

CLINICAL DATA: Abdominal pain, constipation, nausea, and vomiting.  Smoker.
 ACUTE ABDOMINAL SERIES WITH CHEST ? 3 VIEWS ? 12/03/04:

[view not recorded (1 of 3)]
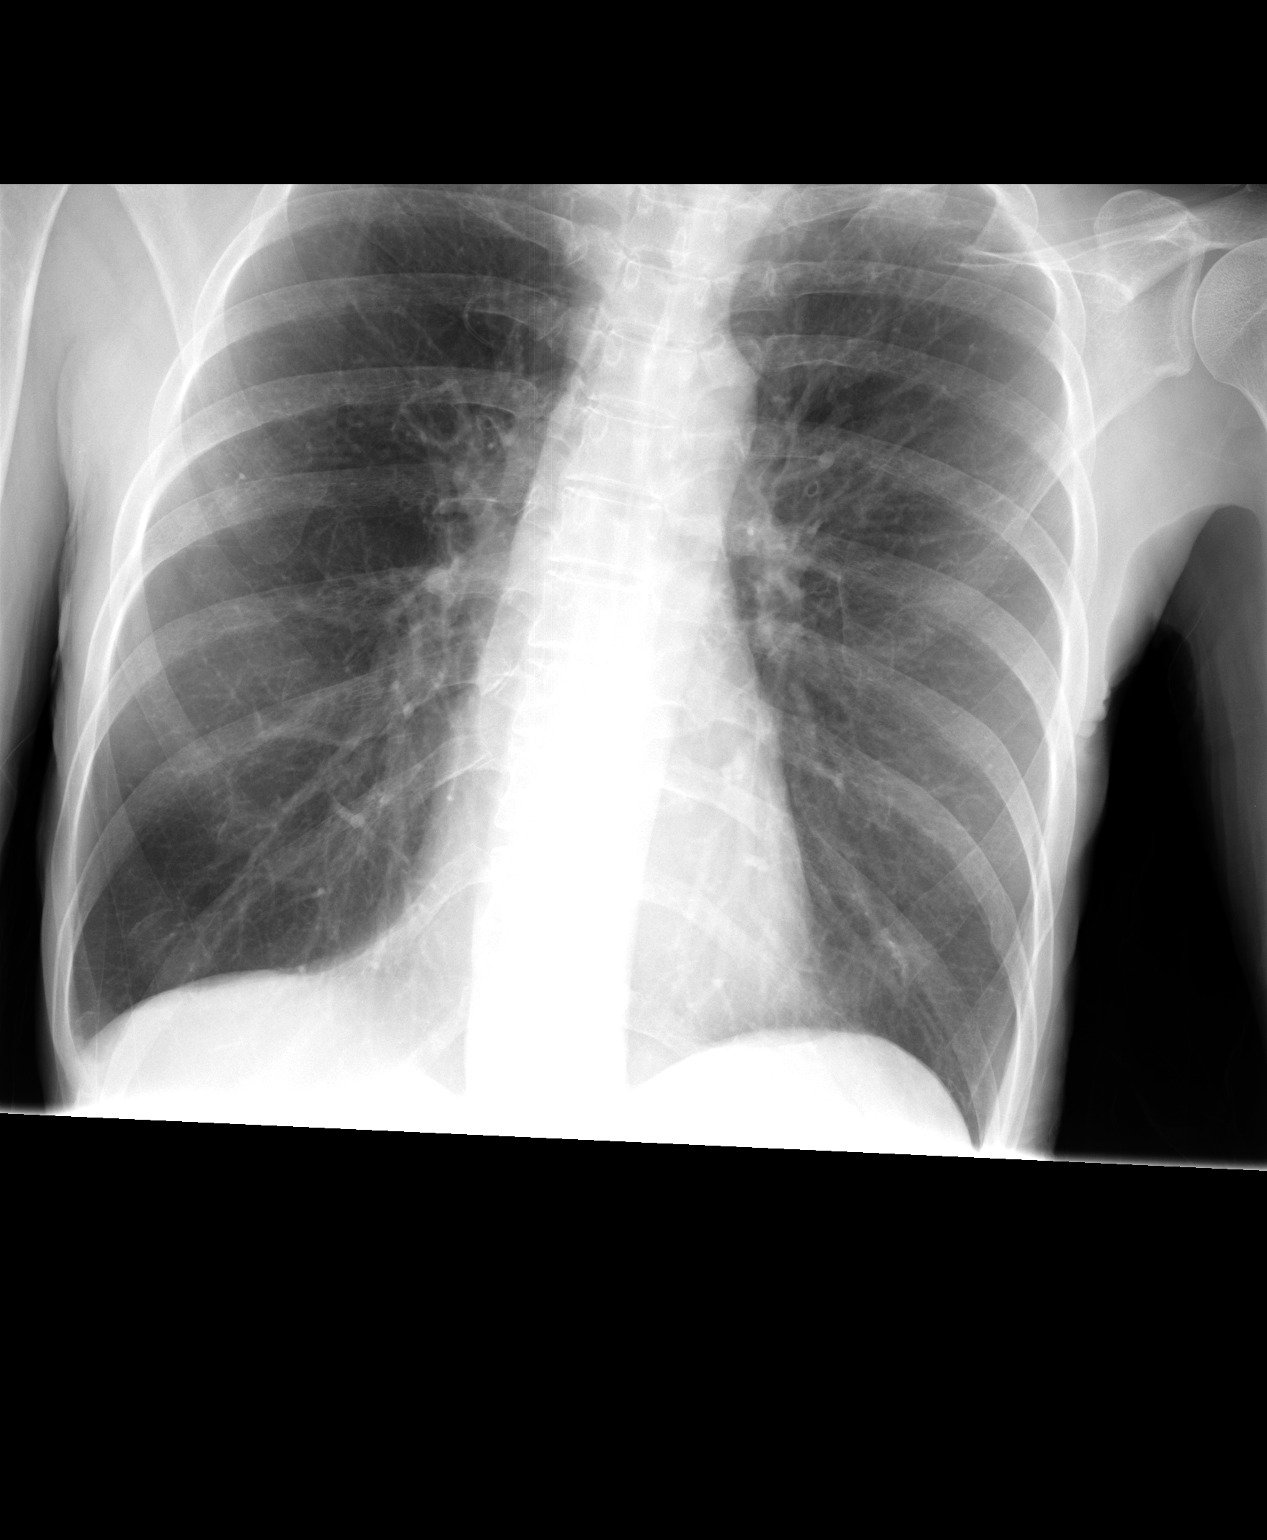

[view not recorded (2 of 3)]
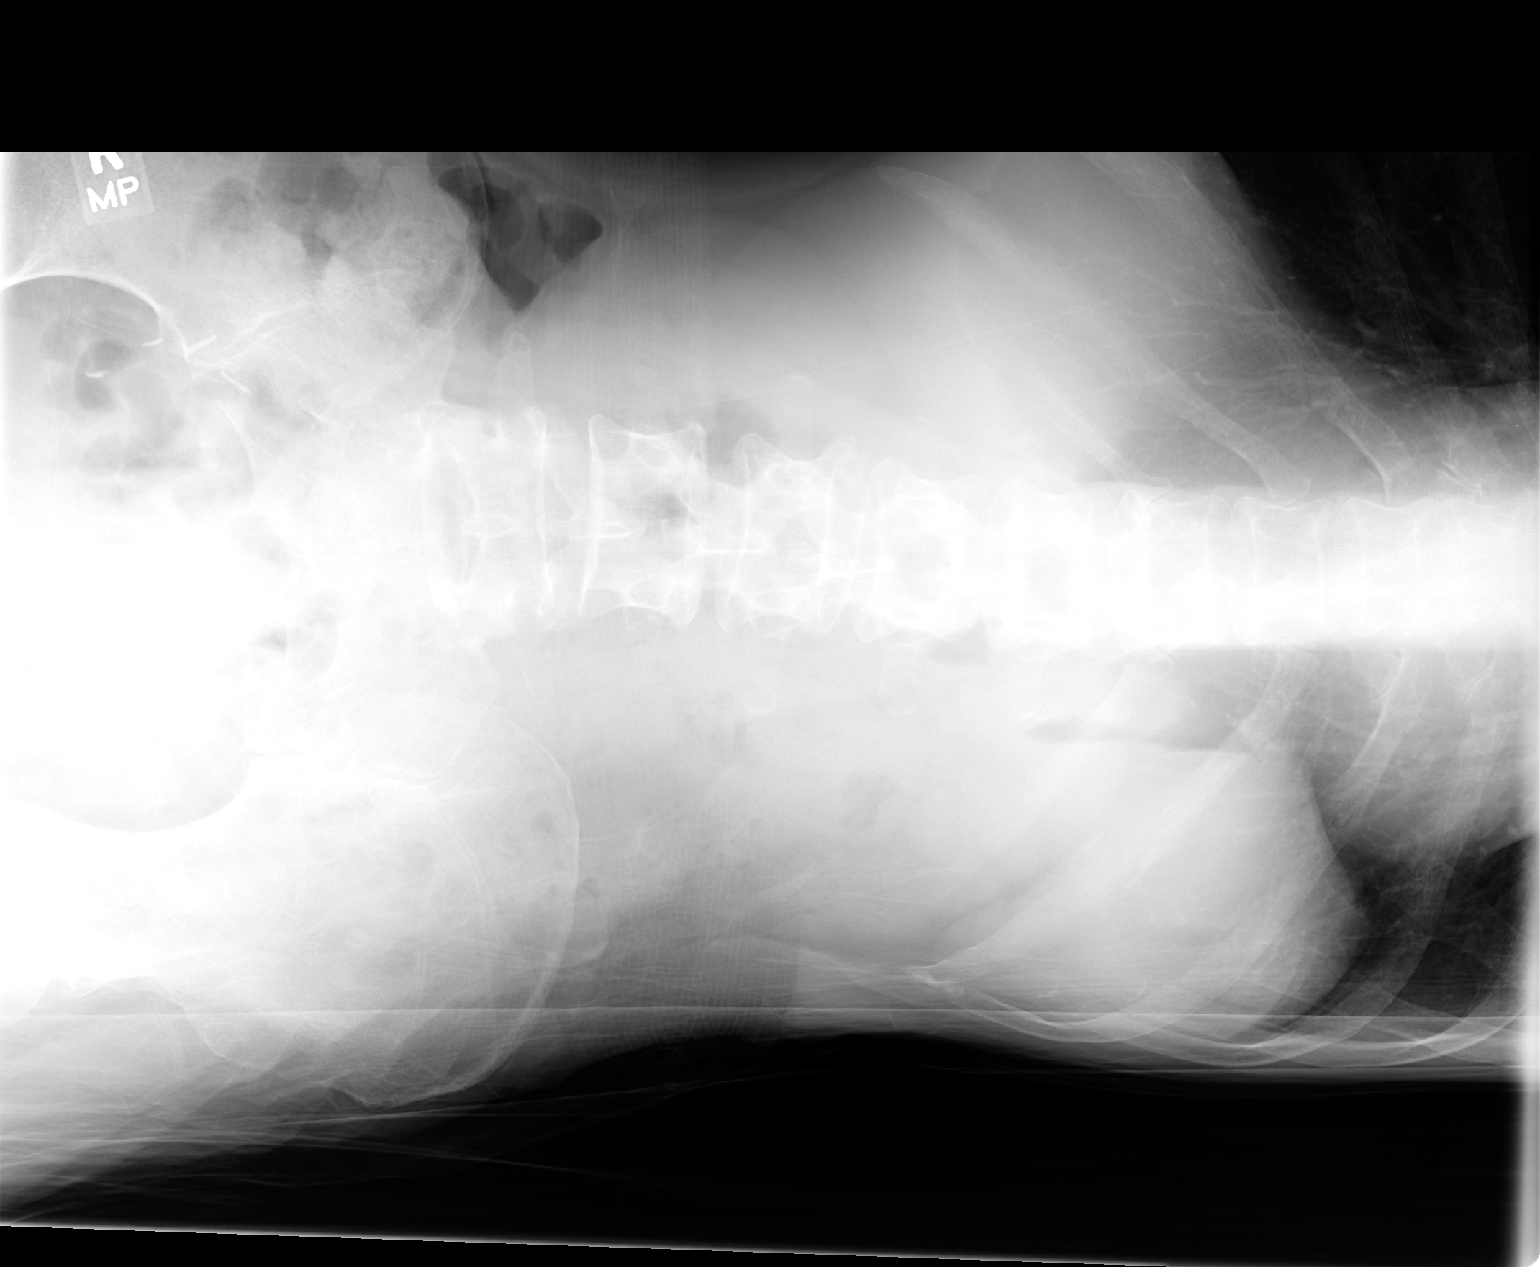

[view not recorded (3 of 3)]
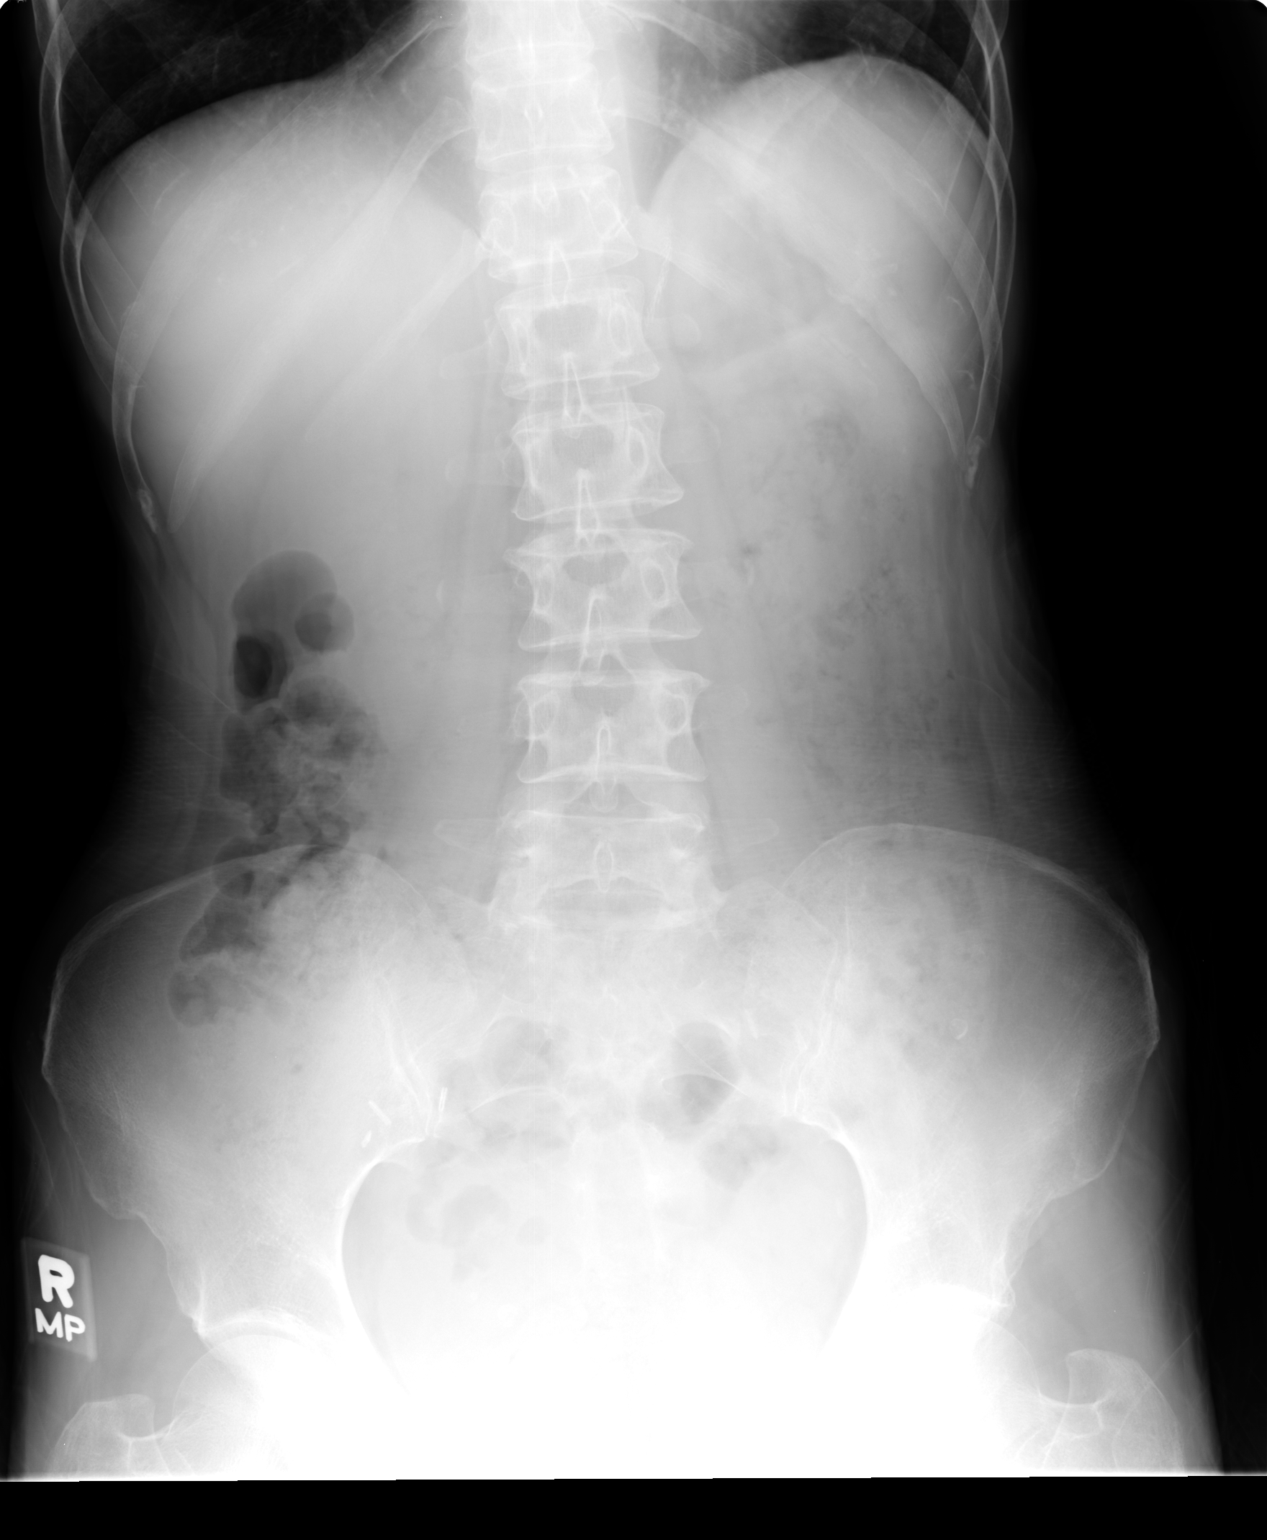

[3 of 3 positions shown; findings below may reference images not displayed]

FINDINGS: Hyperaerated lungs consistent with COPD.  No acute chest process.  Normal heart size.  Normal bowel gas pattern.  No acute abdominal process findings.  Metallic surgical clips in the right pelvis.
IMPRESSION: Negative for active chest or abdominal disease.

## 2007-06-21 ENCOUNTER — Emergency Department (HOSPITAL_COMMUNITY): Admission: EM | Admit: 2007-06-21 | Discharge: 2007-06-21 | Payer: Self-pay | Admitting: Emergency Medicine

## 2007-06-25 ENCOUNTER — Emergency Department (HOSPITAL_COMMUNITY): Admission: EM | Admit: 2007-06-25 | Discharge: 2007-06-25 | Payer: Self-pay | Admitting: Emergency Medicine

## 2007-06-26 ENCOUNTER — Emergency Department (HOSPITAL_COMMUNITY): Admission: EM | Admit: 2007-06-26 | Discharge: 2007-06-26 | Payer: Self-pay | Admitting: Emergency Medicine

## 2007-07-18 IMAGING — US IR US GUIDE VASC ACCESS LEFT
1 series · 2 of 2 positions shown · non-contrast
Comparison: none

CLINICAL DATA: Pancreatitis.  Poor venous access.  Request for central line placement. 
LEFT UPPER EXTREMITY PICC LINE PLACEMENT WITH ULTRASOUND AND FLUOROSCOPIC GUIDANCE:
Date:  01/02/05.
Written informed consent was obtained from the patient prior to the procedure. 
Several unsuccessful attempts at placing a PICC line at the patient?s bedside were unsuccessful by IV Therapy via the right upper extremity.  The right upper extremity was prepped and draped in the normal sterile fashion and 1% lidocaine was used for local anesthesia.  Using direct ultrasound guidance both the right brachial vein and right basilic vein were accessed with a 21 gauge micropuncture needle.  Several attempts to pass the guidewire centrally were unsuccessful.  At this time previous radiology reports were reviewed.   line placement attempted 06/05/01 demonstrated right subclavian vein occlusion with multiple collateral veins.  Contrast was not injected due to documentation of this occlusion.  Plans were then made to place a PICC line within the left upper extremity. The left upper extremity was prepped and draped in a normal sterile fashion and 1% lidocaine was used for local anesthesia.  Using direct ultrasound guidance the left basilic vein was accessed with a 21 gauge micropuncture needle through which a guidewire was easily inserted centrally.  The needle was exchanged over the guidewire for a Peel-Away sheath through which a new 5 French dual-lumen Power-PICC line trimmed to 40 cm was advanced.  The Peel-Away sheath was removed.  Spot chest radiograph confirmed appropriate catheter positioning within the distal portion of the superior vena cava/right atrium.  Both ports of the catheter flushed and aspirated without difficulty.  Both ports were then flushed well with saline per protocol.  0 Prolene sutures were then used to secure the PICC line to the patient?s left upper extremity and a sterile dressing was applied.
Fluoro Time:  0.2 minutes.

[Series 1: sp us guide vasc access*right* · 2 of 2 slices shown]
[im 1/2]
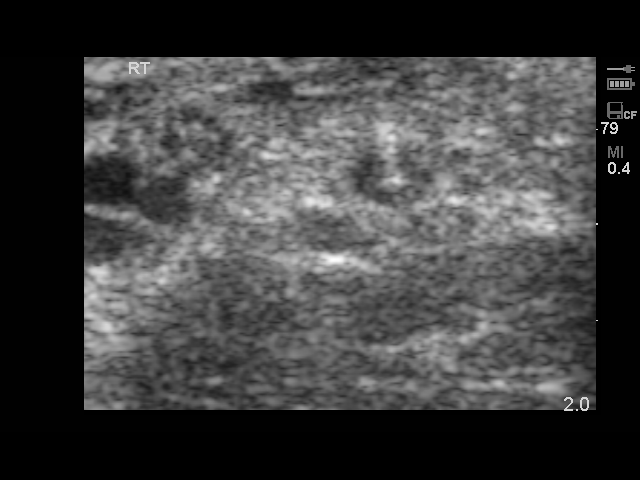
[im 2/2]
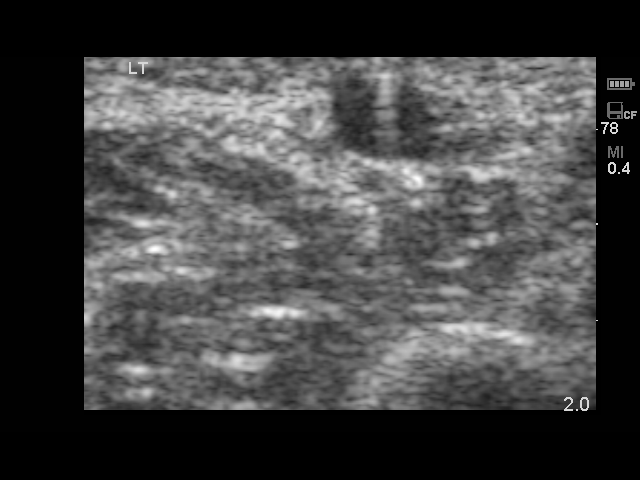

[2 of 2 positions shown; findings below may reference images not displayed]

IMPRESSION: 1.  Successful placement of a left upper extremity PICC line via the left basilic vein.  Tip in distal portion of the superior vena cava/right atrium.  Ready for routine use.
2.  Right subclavian vein occlusion prevents right PICC line placement.

## 2007-07-18 IMAGING — XA IR CV CATH FLUORO GUIDE
1 series · 1 of 1 positions shown · non-contrast
Comparison: none

CLINICAL DATA: Pancreatitis.  Poor venous access.  Request for central line placement. 
LEFT UPPER EXTREMITY PICC LINE PLACEMENT WITH ULTRASOUND AND FLUOROSCOPIC GUIDANCE:
Date:  01/02/05.
Written informed consent was obtained from the patient prior to the procedure. 
Several unsuccessful attempts at placing a PICC line at the patient?s bedside were unsuccessful by IV Therapy via the right upper extremity.  The right upper extremity was prepped and draped in the normal sterile fashion and 1% lidocaine was used for local anesthesia.  Using direct ultrasound guidance both the right brachial vein and right basilic vein were accessed with a 21 gauge micropuncture needle.  Several attempts to pass the guidewire centrally were unsuccessful.  At this time previous radiology reports were reviewed.   line placement attempted 06/05/01 demonstrated right subclavian vein occlusion with multiple collateral veins.  Contrast was not injected due to documentation of this occlusion.  Plans were then made to place a PICC line within the left upper extremity. The left upper extremity was prepped and draped in a normal sterile fashion and 1% lidocaine was used for local anesthesia.  Using direct ultrasound guidance the left basilic vein was accessed with a 21 gauge micropuncture needle through which a guidewire was easily inserted centrally.  The needle was exchanged over the guidewire for a Peel-Away sheath through which a new 5 French dual-lumen Power-PICC line trimmed to 40 cm was advanced.  The Peel-Away sheath was removed.  Spot chest radiograph confirmed appropriate catheter positioning within the distal portion of the superior vena cava/right atrium.  Both ports of the catheter flushed and aspirated without difficulty.  Both ports were then flushed well with saline per protocol.  0 Prolene sutures were then used to secure the PICC line to the patient?s left upper extremity and a sterile dressing was applied.
Fluoro Time:  0.2 minutes.

[Series 1000: run · 0.16mm/px · 1 of 1 slices shown]
[im 1/1]
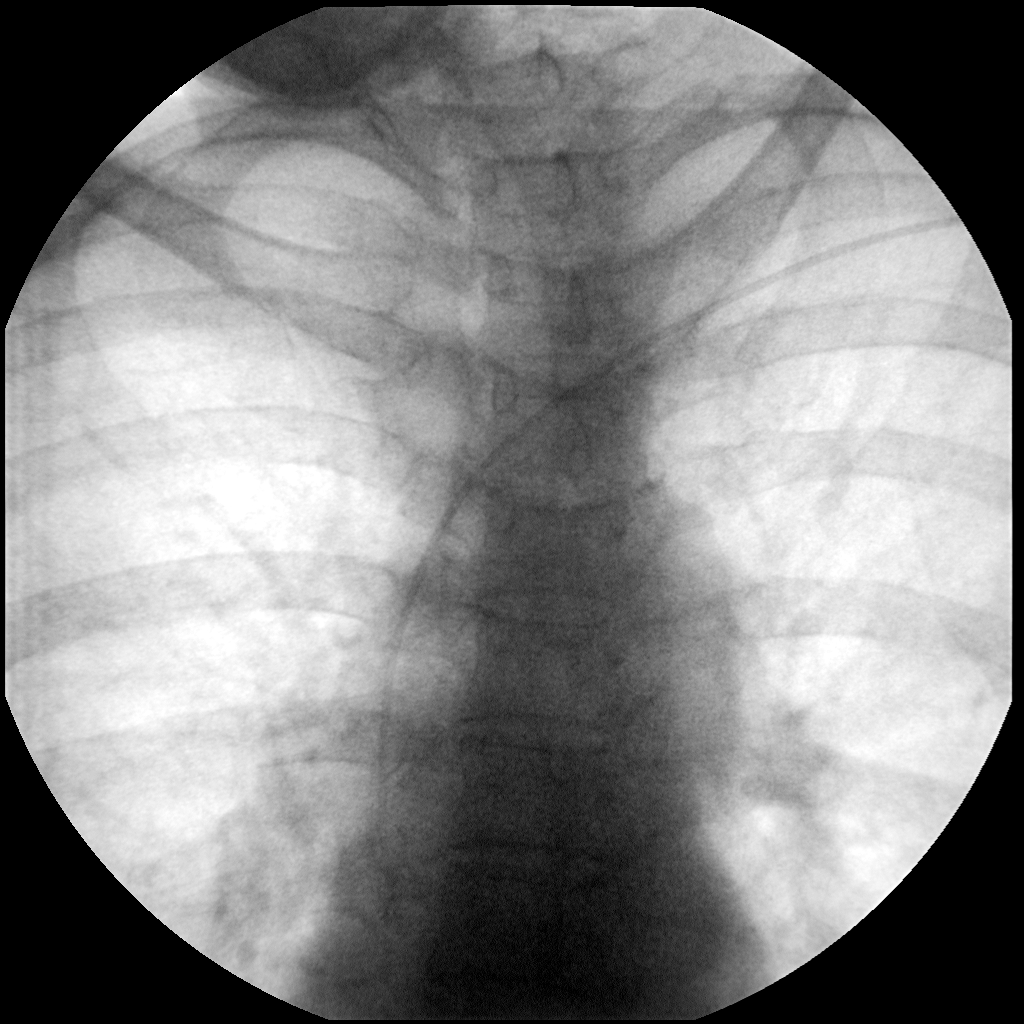

[1 of 1 positions shown; findings below may reference images not displayed]

IMPRESSION: 1.  Successful placement of a left upper extremity PICC line via the left basilic vein.  Tip in distal portion of the superior vena cava/right atrium.  Ready for routine use.
2.  Right subclavian vein occlusion prevents right PICC line placement.

## 2007-07-19 IMAGING — CR DG ABDOMEN 1V
1 series · 1 of 1 positions shown · non-contrast
Comparison: Abdomen radiograph of 01/02/05.

CLINICAL DATA: Left upper quadrant abdominal pain.
 ABDOMEN ? 1 VIEW:
 Single supine abdominal radiograph on 01/03/05.

[t abdomen supine *]
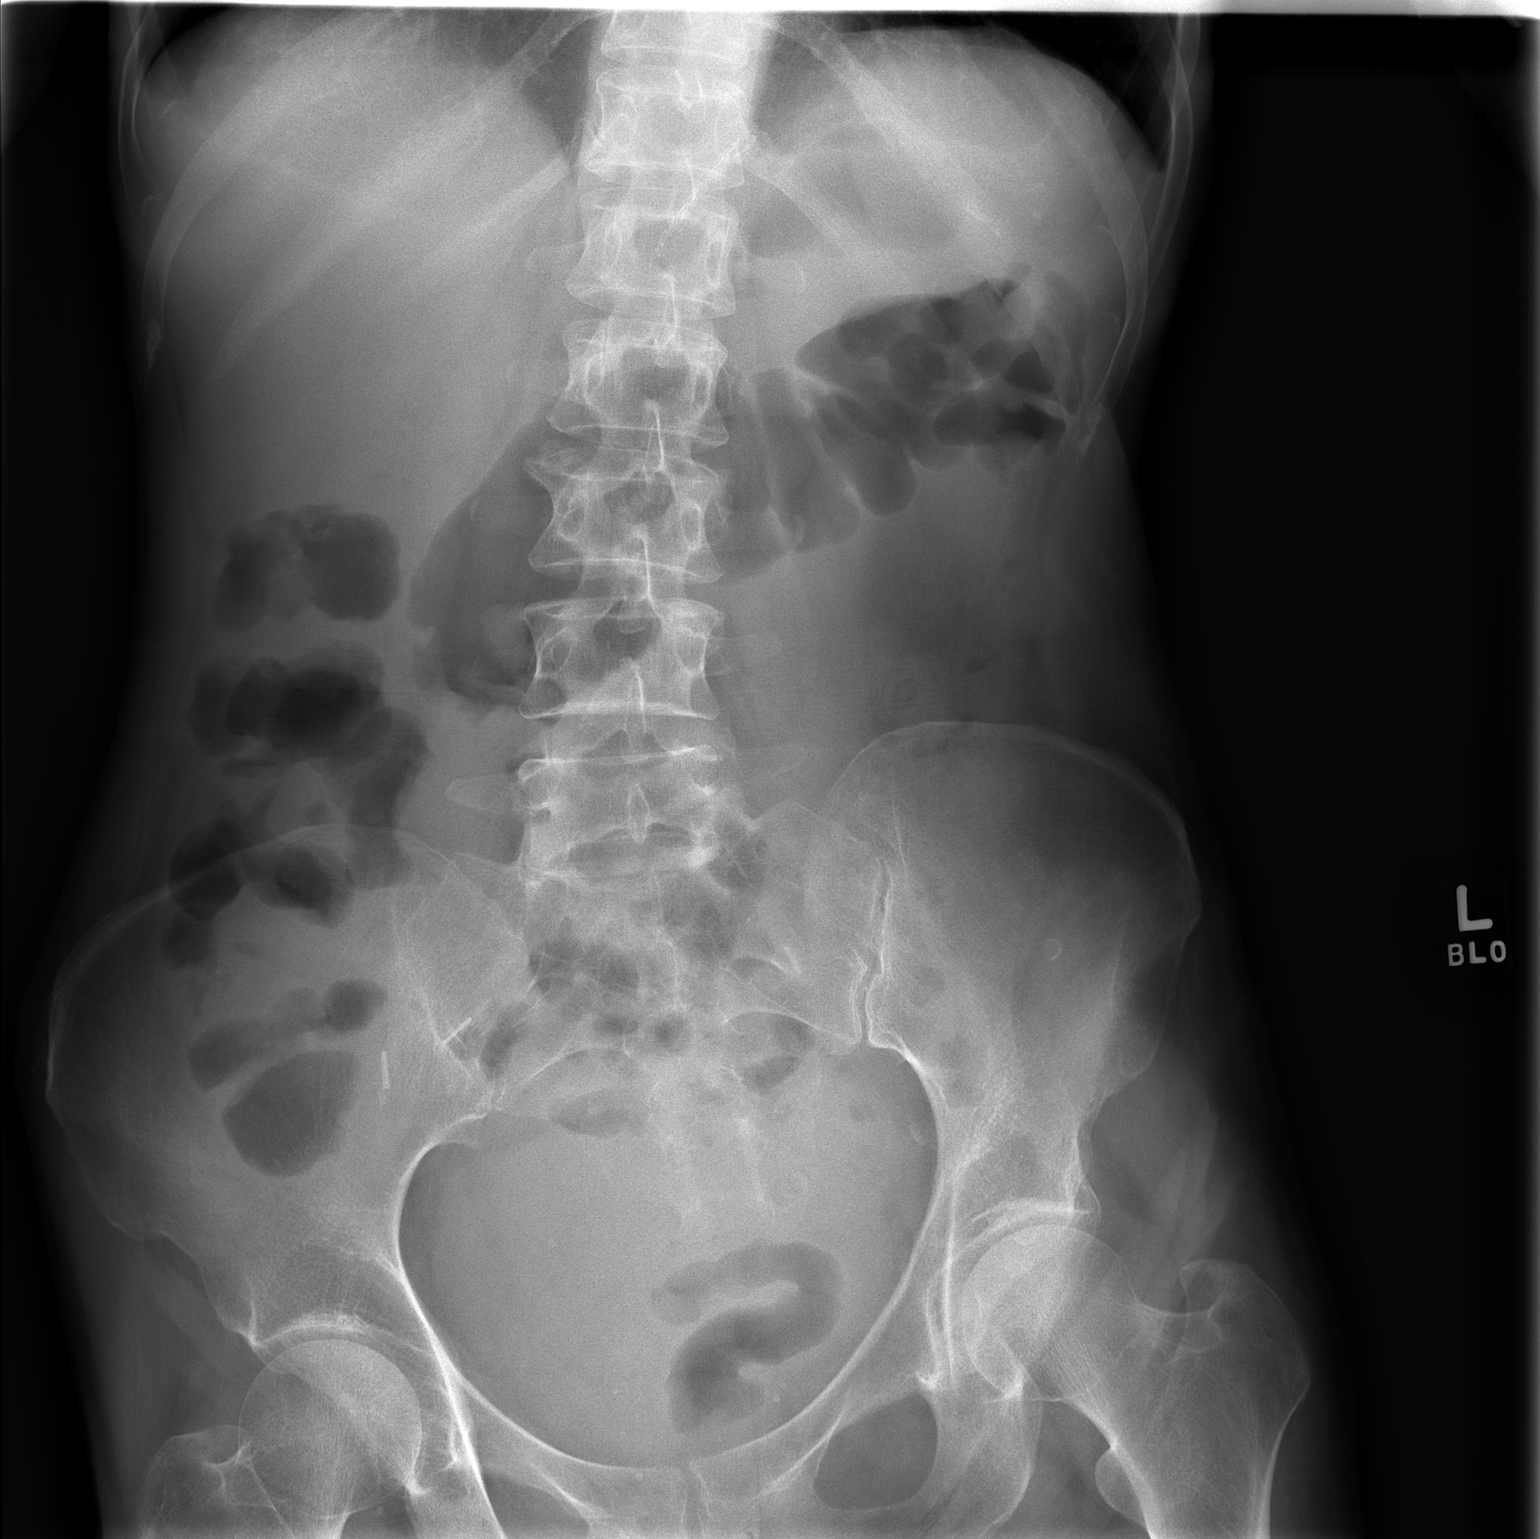

[1 of 1 positions shown; findings below may reference images not displayed]

FINDINGS: There is a normal bowel gas pattern present.  Clips are seen over the right lower quadrant.  No dilated loop of small or large bowel is present.  Presence or absence of air-fluid levels or intra-abdominal free air cannot be assessed on this single supine projection.  Osseous structures are intact.
IMPRESSION: Normal bowel gas pattern without supine evidence for obstruction.

## 2007-07-24 ENCOUNTER — Emergency Department (HOSPITAL_COMMUNITY): Admission: EM | Admit: 2007-07-24 | Discharge: 2007-07-24 | Payer: Self-pay | Admitting: Emergency Medicine

## 2007-08-11 IMAGING — CR DG ABDOMEN ACUTE W/ 1V CHEST
4 series · 4 of 4 positions shown · non-contrast
Comparison: One view abdomen x-ray 01/03/2005.

CLINICAL DATA: Chronic abdominal pain. Blood in stool. Smoker.

ACUTE ABDOMEN SERIES  (2 VIEW  ABDOMEN AND 1 VIEW CHEST)  01/26/2005:

[w chest pa]
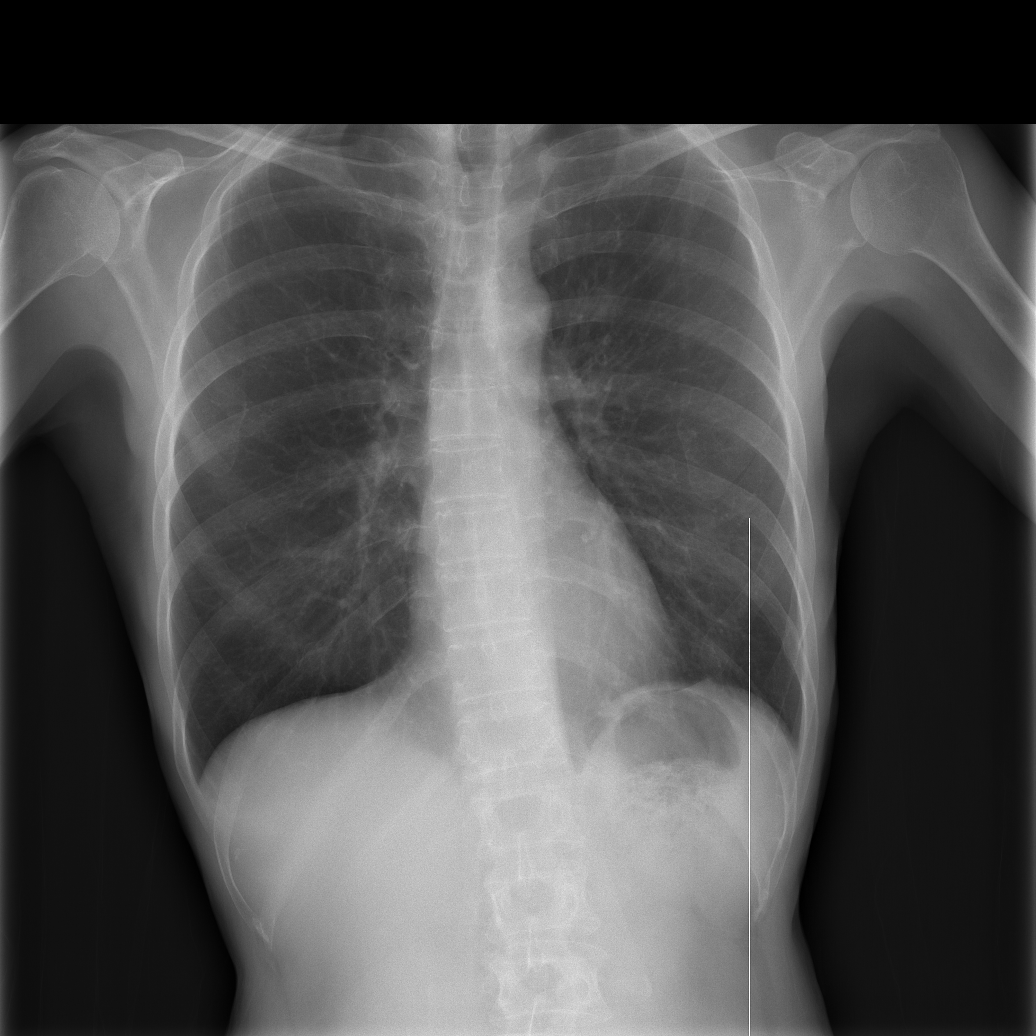

[w abdomen upright *]
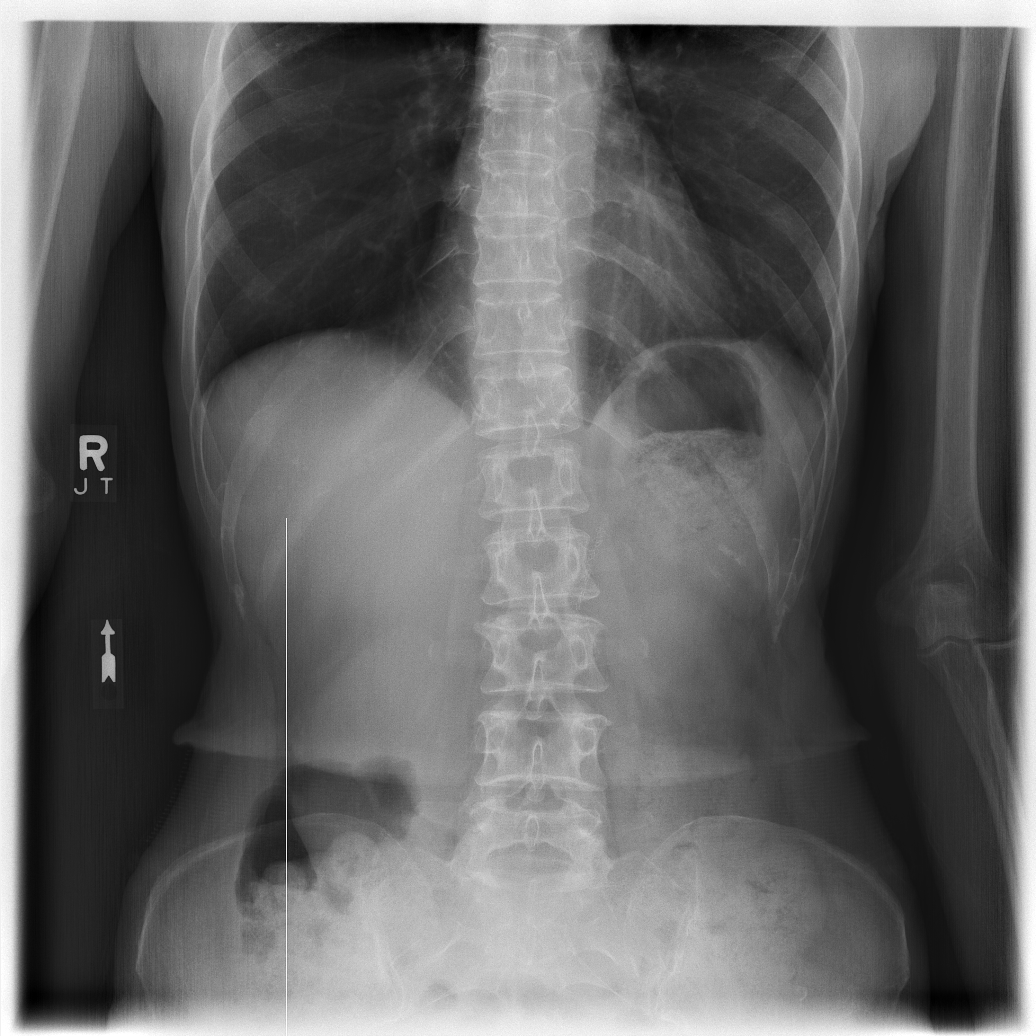

[t abdomen supine]
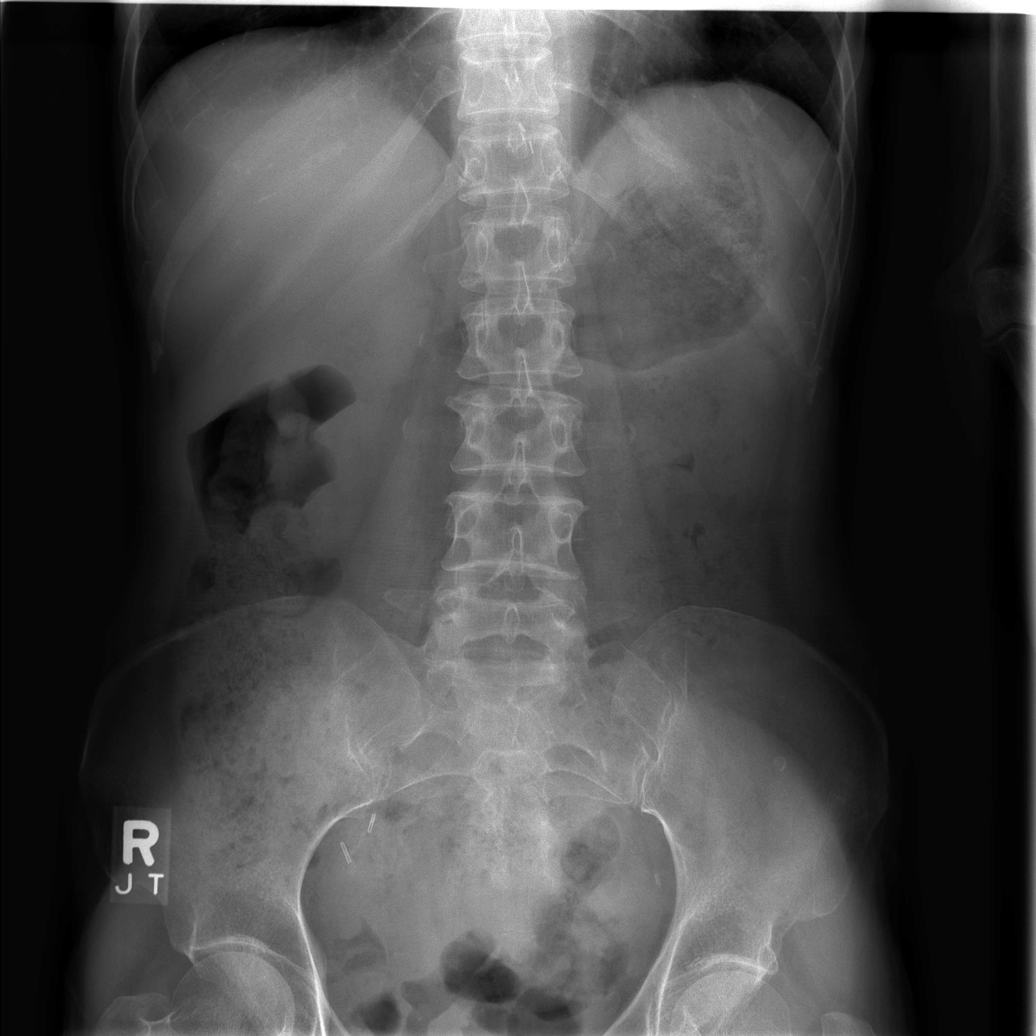

[t abdomen supine *]
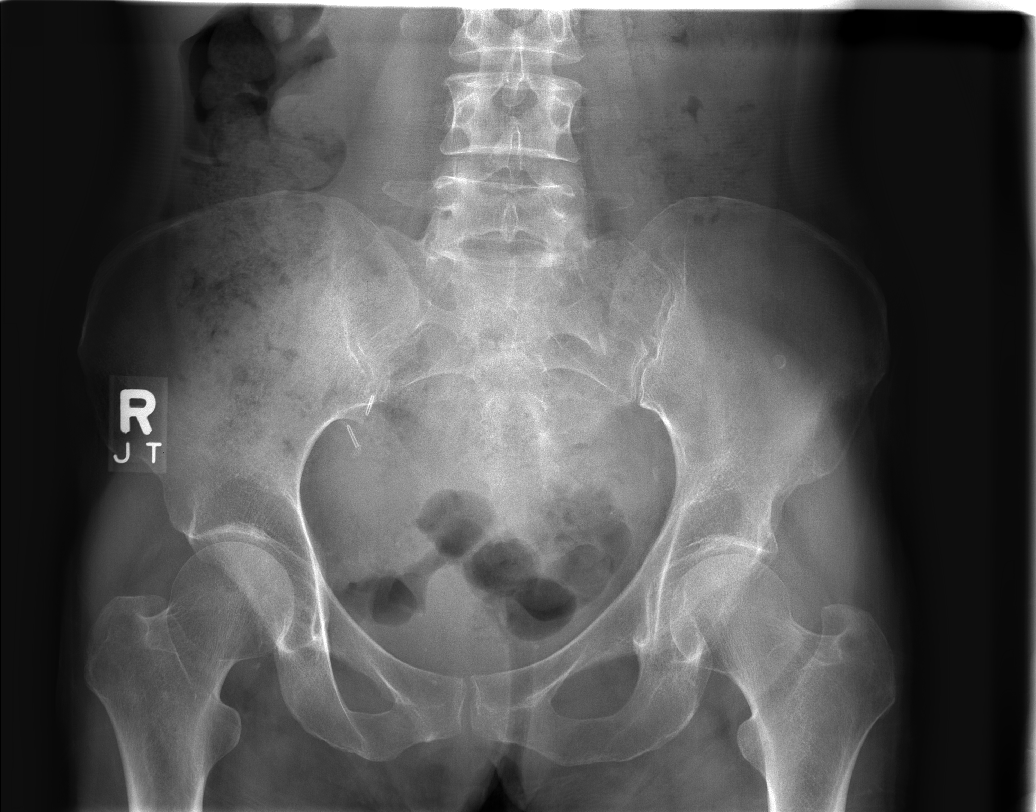

[4 of 4 positions shown; findings below may reference images not displayed]

FINDINGS: The bowel gas pattern is unremarkable there is no evidence of
obstruction or significant ileus. Surgical clips are noted in the right side of
the pelvis, and surgical anastomotic suture material is noted in the left upper
quadrant of the abdomen. Moderate stool is noted throughout the colon. An
injection granuloma is noted in the left buttock. No opaque urinary tract
calculi are identified. The psoas margins are intact. There is no evidence of
free air on the erect view.

The accompanying PA chest x-ray shows a normal cardiomediastinal silhouette. The
lungs are mildly hyperinflated and there is central peribronchial thickening.
The lungs are otherwise clear.
IMPRESSION: 1. No acute abdominal abnormalities apart from possible constipation.
2. No acute cardiopulmonary disease. Mild COPD suspected.

## 2007-08-14 ENCOUNTER — Emergency Department (HOSPITAL_COMMUNITY): Admission: EM | Admit: 2007-08-14 | Discharge: 2007-08-14 | Payer: Self-pay | Admitting: Emergency Medicine

## 2007-08-16 ENCOUNTER — Emergency Department (HOSPITAL_COMMUNITY): Admission: EM | Admit: 2007-08-16 | Discharge: 2007-08-16 | Payer: Self-pay | Admitting: Emergency Medicine

## 2007-08-19 ENCOUNTER — Emergency Department (HOSPITAL_BASED_OUTPATIENT_CLINIC_OR_DEPARTMENT_OTHER): Admission: EM | Admit: 2007-08-19 | Discharge: 2007-08-19 | Payer: Self-pay | Admitting: Emergency Medicine

## 2007-08-21 ENCOUNTER — Inpatient Hospital Stay (HOSPITAL_COMMUNITY): Admission: EM | Admit: 2007-08-21 | Discharge: 2007-08-25 | Payer: Self-pay | Admitting: Emergency Medicine

## 2007-09-19 ENCOUNTER — Emergency Department (HOSPITAL_COMMUNITY): Admission: EM | Admit: 2007-09-19 | Discharge: 2007-09-19 | Payer: Self-pay | Admitting: Emergency Medicine

## 2007-10-09 ENCOUNTER — Emergency Department (HOSPITAL_COMMUNITY): Admission: EM | Admit: 2007-10-09 | Discharge: 2007-10-09 | Payer: Self-pay | Admitting: Emergency Medicine

## 2007-10-13 ENCOUNTER — Emergency Department (HOSPITAL_COMMUNITY): Admission: EM | Admit: 2007-10-13 | Discharge: 2007-10-13 | Payer: Self-pay | Admitting: Emergency Medicine

## 2007-11-02 ENCOUNTER — Emergency Department (HOSPITAL_COMMUNITY): Admission: EM | Admit: 2007-11-02 | Discharge: 2007-11-02 | Payer: Self-pay | Admitting: Emergency Medicine

## 2007-12-03 ENCOUNTER — Emergency Department (HOSPITAL_COMMUNITY): Admission: EM | Admit: 2007-12-03 | Discharge: 2007-12-03 | Payer: Self-pay | Admitting: Emergency Medicine

## 2007-12-30 IMAGING — CR DG ABDOMEN ACUTE W/ 1V CHEST
3 series · 3 of 3 positions shown · non-contrast
Comparison: 01/26/05.

CLINICAL DATA: Abdominal pain, vomiting, and history of pancreatitis.
 ACUTE ABDOMINAL SERIES WITH UPRIGHT CHEST ? 06/16/05:

[view not recorded (1 of 3)]
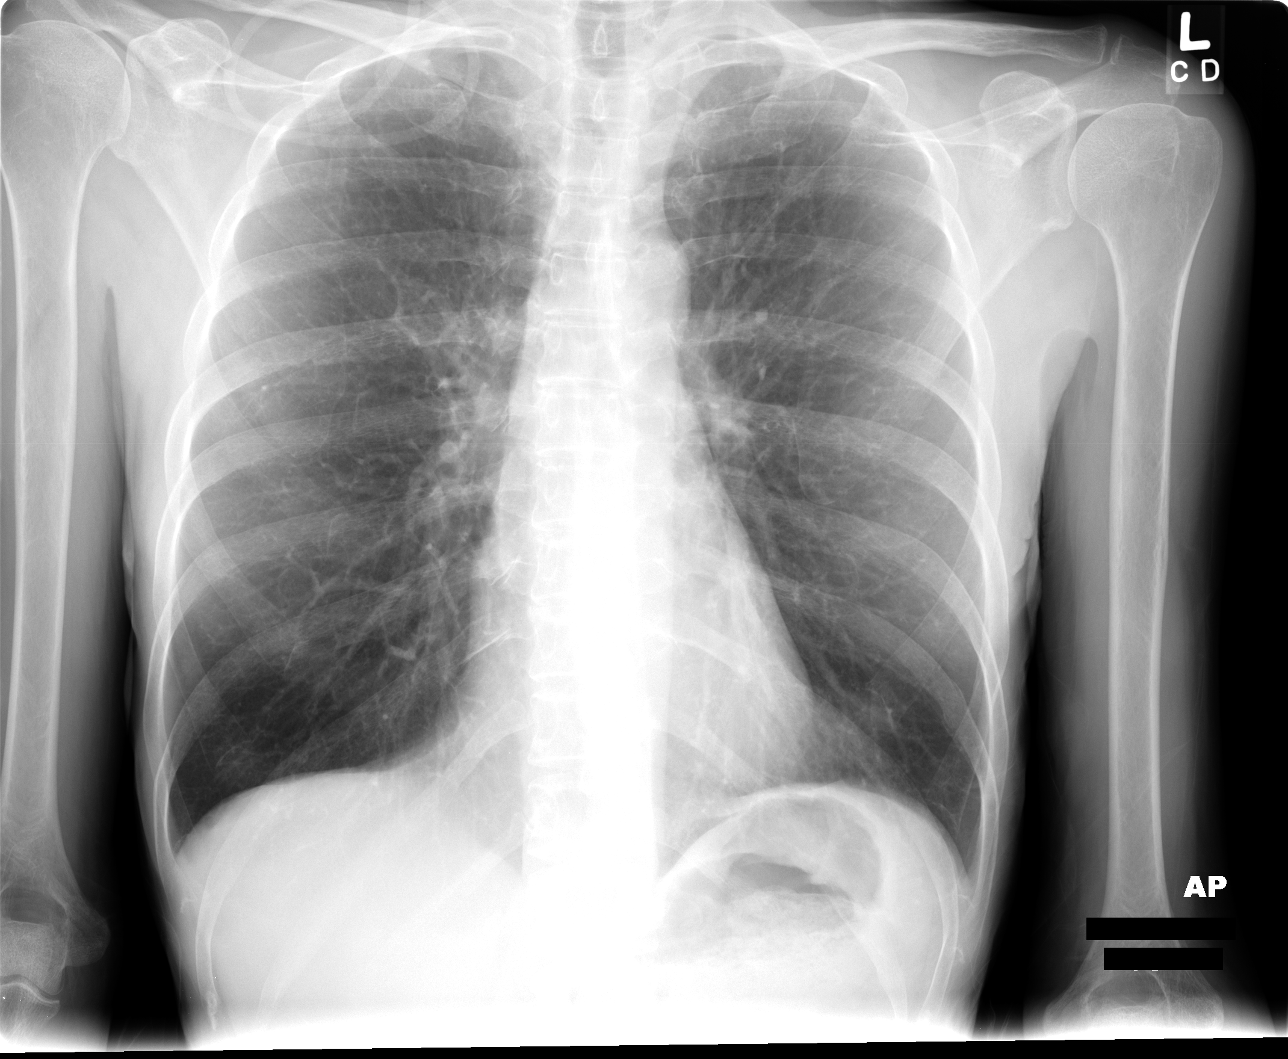

[view not recorded (2 of 3)]
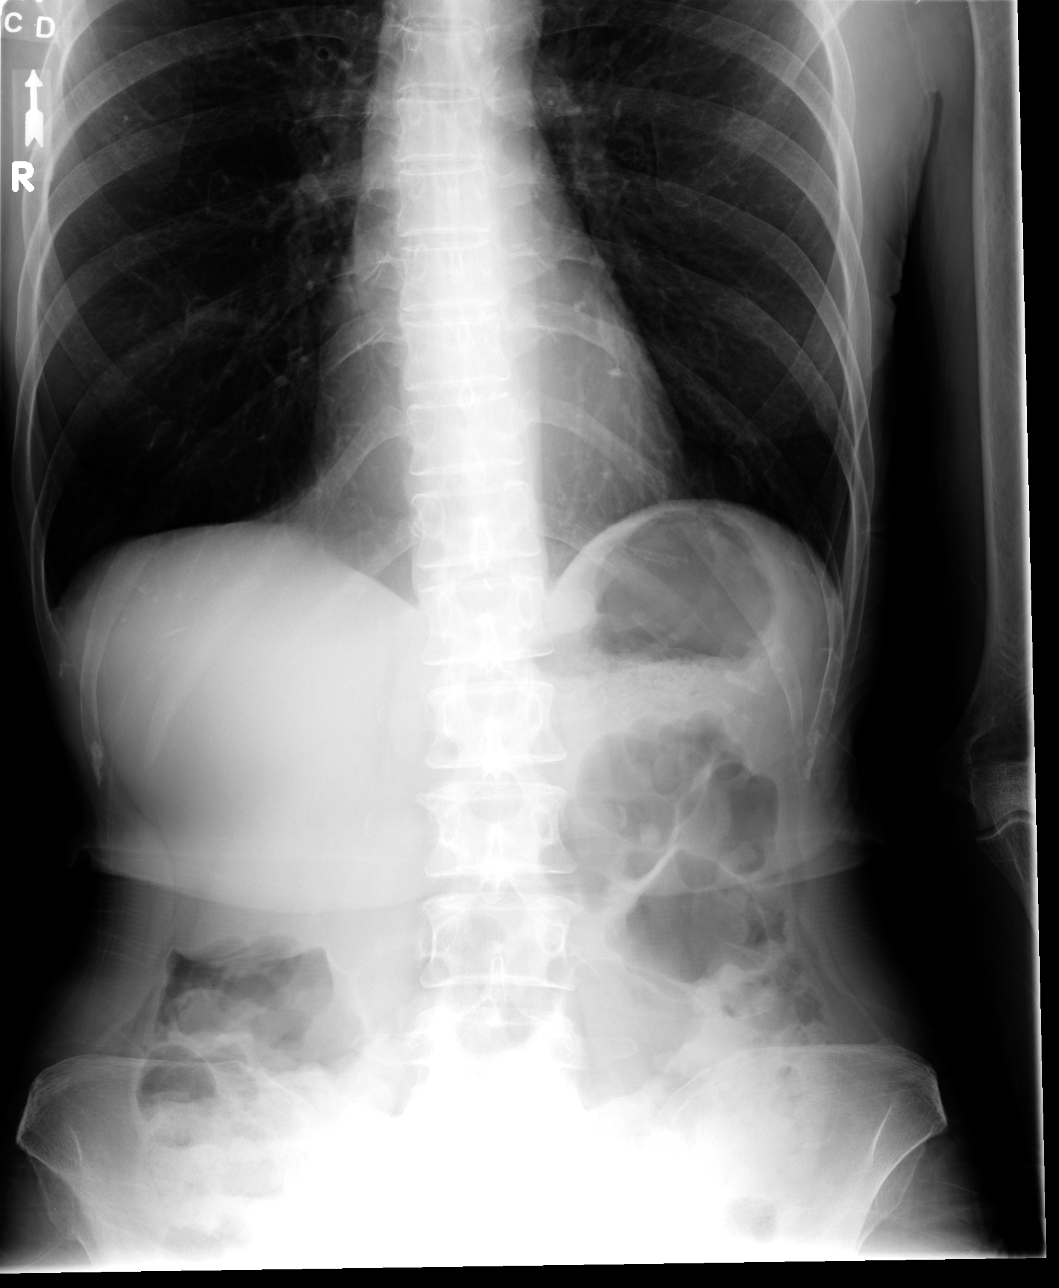

[view not recorded (3 of 3)]
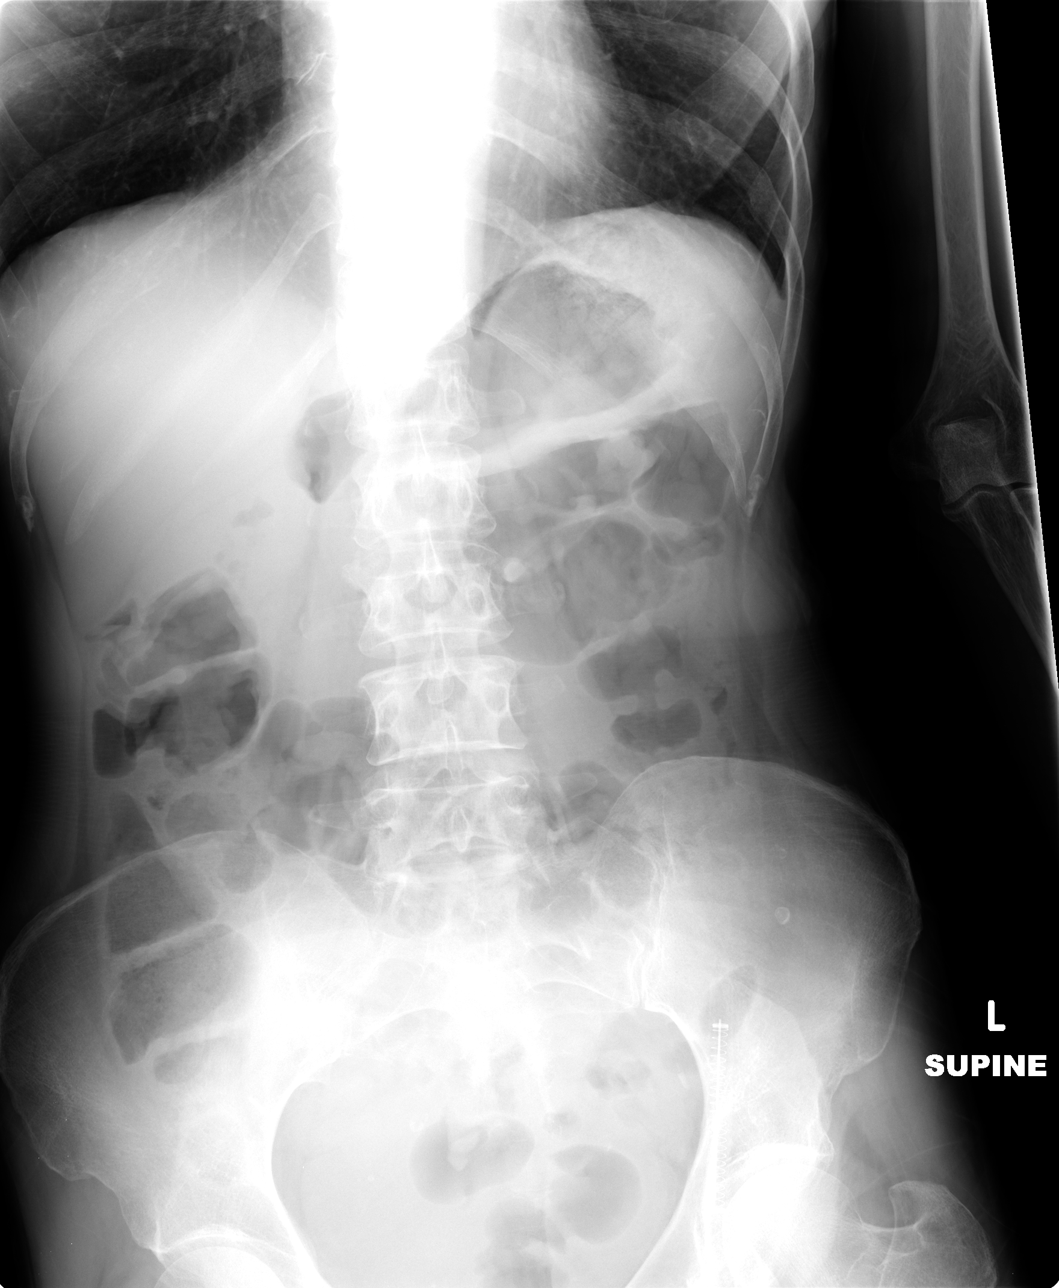

[3 of 3 positions shown; findings below may reference images not displayed]

FINDINGS: Upright view of the chest as well as supine and upright views of the abdomen were performed.  The chest shows stable COPD and pulmonary scarring.  Abdominal films show no bowel obstruction or free air.  There may be a component of mild colonic ileus.  No abnormal calcifications are identified.
IMPRESSION: Stable COPD in the chest.  No overt bowel obstruction.  Possible component of mild colonic ileus.

## 2007-12-31 IMAGING — US IR US GUIDE VASC ACCESS LEFT
1 series · 1 of 1 positions shown · non-contrast
Comparison: none

CLINICAL DATA: Patient with history of chronic pancreatitis; presents now with abdominal pain, nausea, and vomiting; poor venous access.  Central venous access is requested for fluids and medications; known right subclavian vein occlusion.
LEFT UPPER EXTREMITY DOUBLE-LUMEN PICC PLACEMENT WITH ULTRASOUND AND FLUOROSCOPIC GUIDANCE ? 06/17/05: 
Procedure:  The left arm was prepped with Betadine, draped in the usual sterile fashion, and infiltrated locally with 1% lidocaine.  Ultrasound demonstrated patency of the left brachial vein.  Under real-time ultrasound guidance, this vein was accessed with a 21 gauge micropuncture needle.  Ultrasound image documentation was performed.  The needle was exchanged over a guidewire for a Peel-Away sheath, through which a 5 French dual-lumen PICC catheter trimmed to 39 cm was advanced, positioned with its tip at the distal SVC/right atrial junction.  Fluoroscopy during the procedure and fluoroscopic spot radiograph confirm appropriate catheter tip position.  The catheter was flushed, secured to the skin with Prolene sutures, and covered with a sterile dressing.  No immediate complications.

[Series 1: sp us guide vasc access*right* · 1 of 1 slices shown]
[im 1/1]
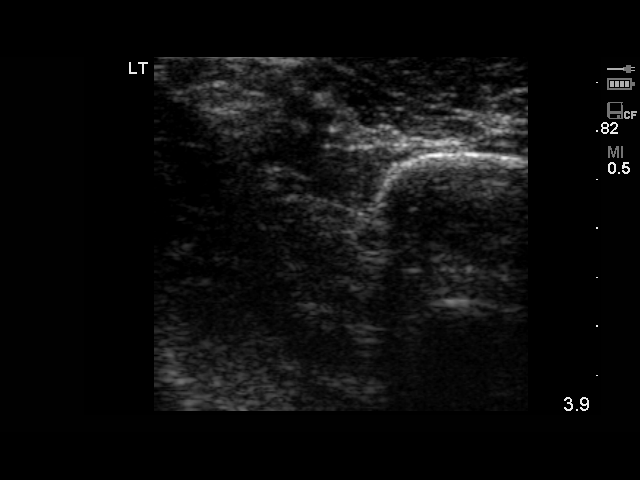

[1 of 1 positions shown; findings below may reference images not displayed]

IMPRESSION: Technically successful left arm PICC placement with ultrasound and fluoroscopic guidance.  Ready for routine use.

## 2008-01-09 ENCOUNTER — Encounter: Admission: RE | Admit: 2008-01-09 | Discharge: 2008-01-09 | Payer: Self-pay | Admitting: Interventional Radiology

## 2008-01-12 ENCOUNTER — Ambulatory Visit (HOSPITAL_COMMUNITY): Admission: RE | Admit: 2008-01-12 | Discharge: 2008-01-12 | Payer: Self-pay | Admitting: Interventional Radiology

## 2008-01-17 ENCOUNTER — Ambulatory Visit (HOSPITAL_COMMUNITY): Admission: RE | Admit: 2008-01-17 | Discharge: 2008-01-17 | Payer: Self-pay | Admitting: Interventional Radiology

## 2008-02-02 IMAGING — US IR US GUIDE VASC ACCESS LEFT
1 series · 1 of 1 positions shown · non-contrast
Comparison: none

CLINICAL DATA: Pancreatitis.  Request has been made for IV access for antibiotics and fluid.
 UPPER EXTREMITY PICC PLACEMENT WITH ULTRASOUND AND FLUORO GUIDANCE:
TECHNIQUE: The left arm was prepped with Betadine, draped in the usual sterile fashion, and infiltrated locally with 1% Lidocaine.  Ultrasound demonstrated patency of the cephalic vessel.  Under real-time ultrasound guidance, this vein was accessed with a 21 gauge micropuncture needle.  Ultrasound image documentation was performed.  The needle was exchanged over a guidewire for a peel-away sheath through which a 5 French double lumen PICC catheter trimmed to 37 cm was advanced, positioned with its tip at the cavoatrial junction.  Fluoroscopy during the procedure and fluoro spot radiograph confirms appropriate catheter position.  The catheter was flushed, secured to the skin with Prolene sutures, and covered with a sterile dressing. No immediate complication.

[Series 1: sp us guide vasc access*left* · 1 of 1 slices shown]
[im 1/1]
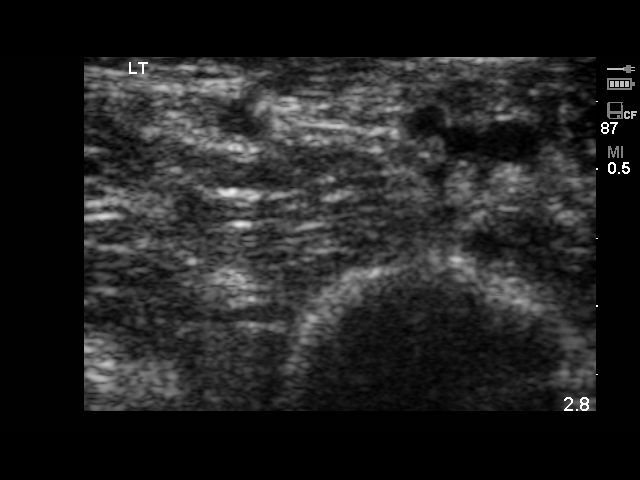

[1 of 1 positions shown; findings below may reference images not displayed]

IMPRESSION: Technically successful left arm PICC placement with ultrasound and fluoroscopic guidance.  Ready for routine use.

## 2008-02-02 IMAGING — XA IR FLUORO GUIDE CV LINE*L*
1 series · 1 of 1 positions shown · non-contrast
Comparison: none

CLINICAL DATA: Pancreatitis.  Request has been made for IV access for antibiotics and fluid.
 UPPER EXTREMITY PICC PLACEMENT WITH ULTRASOUND AND FLUORO GUIDANCE:
TECHNIQUE: The left arm was prepped with Betadine, draped in the usual sterile fashion, and infiltrated locally with 1% Lidocaine.  Ultrasound demonstrated patency of the cephalic vessel.  Under real-time ultrasound guidance, this vein was accessed with a 21 gauge micropuncture needle.  Ultrasound image documentation was performed.  The needle was exchanged over a guidewire for a peel-away sheath through which a 5 French double lumen PICC catheter trimmed to 37 cm was advanced, positioned with its tip at the cavoatrial junction.  Fluoroscopy during the procedure and fluoro spot radiograph confirms appropriate catheter position.  The catheter was flushed, secured to the skin with Prolene sutures, and covered with a sterile dressing. No immediate complication.

[Series 1000: run · 0.17mm/px · 1 of 1 slices shown]
[im 1/1]
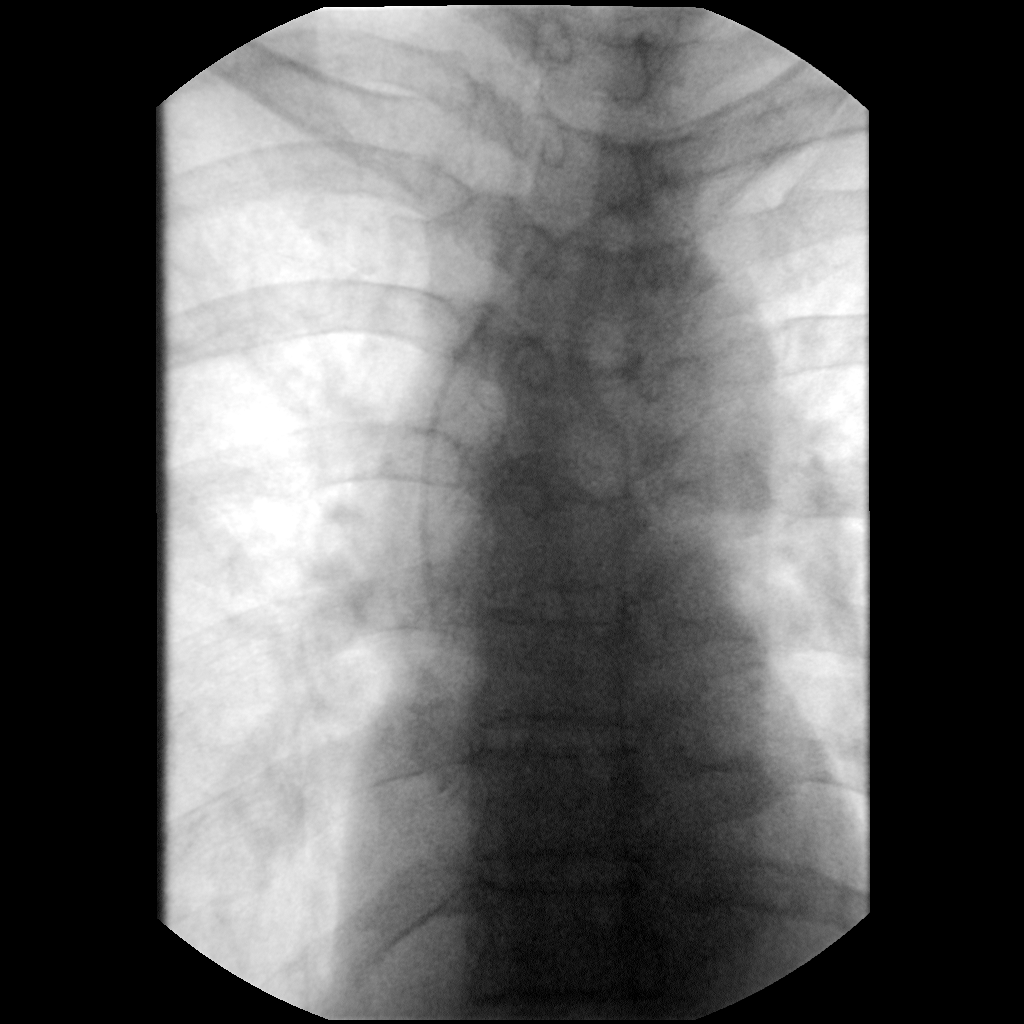

[1 of 1 positions shown; findings below may reference images not displayed]

IMPRESSION: Technically successful left arm PICC placement with ultrasound and fluoroscopic guidance.  Ready for routine use.

## 2008-02-06 ENCOUNTER — Encounter: Admission: RE | Admit: 2008-02-06 | Discharge: 2008-02-06 | Payer: Self-pay | Admitting: Interventional Radiology

## 2008-02-14 ENCOUNTER — Emergency Department (HOSPITAL_COMMUNITY): Admission: EM | Admit: 2008-02-14 | Discharge: 2008-02-14 | Payer: Self-pay | Admitting: Emergency Medicine

## 2008-02-18 ENCOUNTER — Emergency Department (HOSPITAL_COMMUNITY): Admission: EM | Admit: 2008-02-18 | Discharge: 2008-02-18 | Payer: Self-pay | Admitting: Emergency Medicine

## 2008-03-20 ENCOUNTER — Inpatient Hospital Stay (HOSPITAL_COMMUNITY): Admission: EM | Admit: 2008-03-20 | Discharge: 2008-03-21 | Payer: Self-pay | Admitting: Emergency Medicine

## 2008-03-21 IMAGING — XA IR FLUORO GUIDE CV LINE*L*
1 series · 1 of 1 positions shown · non-contrast
Comparison: none

CLINICAL DATA: Pancreatitis with nausea and vomiting.
 UPPER EXTREMITY PICC PLACEMENT WITH ULTRASOUND AND FLUORO GUIDANCE:
TECHNIQUE: The left arm was prepped with Betadine, draped in the usual sterile fashion, and infiltrated locally with 1% Lidocaine.  Ultrasound demonstrated patency of the basilic vein.  Under real-time ultrasound guidance, this vein was accessed with a 21 gauge micropuncture needle.  Ultrasound image documentation was performed.  The needle was exchanged over a guidewire for a peel-away sheath through which a 5 French lumen power PICC catheter trimmed to 38.5cm was advanced, positioned with its tip at the distal superior vena cava.  Fluoroscopy during the procedure and fluoro spot radiograph confirms appropriate catheter position.  The catheter was flushed, secured to the skin with Prolene sutures, and covered with a sterile dressing. No immediate complication.

[Series 1000: run · 0.20mm/px · 1 of 1 slices shown]
[im 1/1]
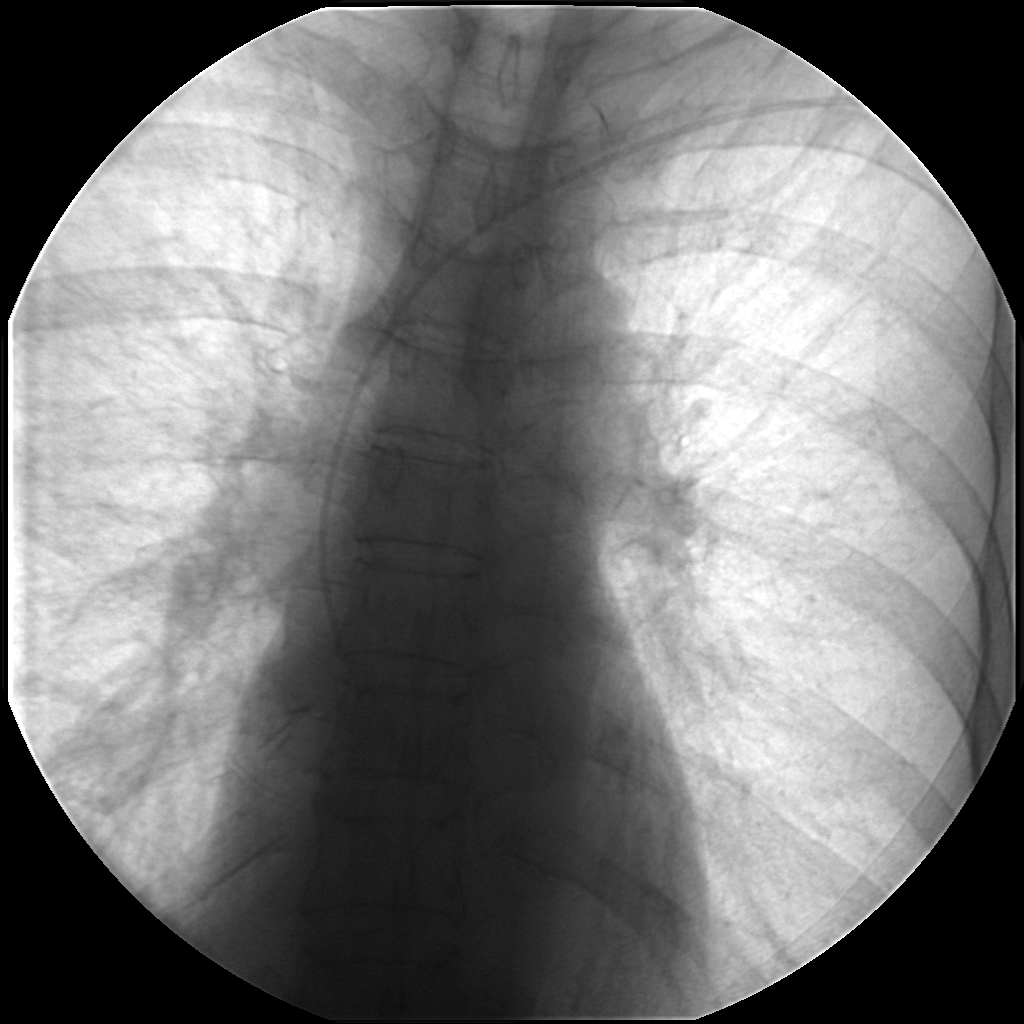

[1 of 1 positions shown; findings below may reference images not displayed]

IMPRESSION: Technically successful left arm PICC placement with ultrasound and fluoroscopic guidance.  Ready for routine use.

## 2008-03-21 IMAGING — US IR FLUORO GUIDE CV LINE*L*
1 series · 1 of 1 positions shown · non-contrast
Comparison: none

CLINICAL DATA: Pancreatitis with nausea and vomiting.
 UPPER EXTREMITY PICC PLACEMENT WITH ULTRASOUND AND FLUORO GUIDANCE:
TECHNIQUE: The left arm was prepped with Betadine, draped in the usual sterile fashion, and infiltrated locally with 1% Lidocaine.  Ultrasound demonstrated patency of the basilic vein.  Under real-time ultrasound guidance, this vein was accessed with a 21 gauge micropuncture needle.  Ultrasound image documentation was performed.  The needle was exchanged over a guidewire for a peel-away sheath through which a 5 French lumen power PICC catheter trimmed to 38.5cm was advanced, positioned with its tip at the distal superior vena cava.  Fluoroscopy during the procedure and fluoro spot radiograph confirms appropriate catheter position.  The catheter was flushed, secured to the skin with Prolene sutures, and covered with a sterile dressing. No immediate complication.

[Series 1: sp us guide vasc access*right* · 1 of 1 slices shown]
[im 1/1]
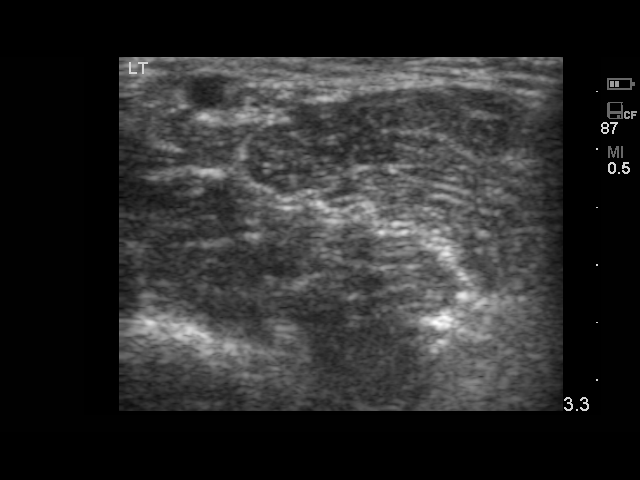

[1 of 1 positions shown; findings below may reference images not displayed]

IMPRESSION: Technically successful left arm PICC placement with ultrasound and fluoroscopic guidance.  Ready for routine use.

## 2008-04-15 ENCOUNTER — Inpatient Hospital Stay (HOSPITAL_COMMUNITY): Admission: EM | Admit: 2008-04-15 | Discharge: 2008-04-17 | Payer: Self-pay | Admitting: Emergency Medicine

## 2008-05-08 ENCOUNTER — Emergency Department (HOSPITAL_COMMUNITY): Admission: EM | Admit: 2008-05-08 | Discharge: 2008-05-08 | Payer: Self-pay | Admitting: Emergency Medicine

## 2008-06-03 ENCOUNTER — Emergency Department (HOSPITAL_COMMUNITY): Admission: EM | Admit: 2008-06-03 | Discharge: 2008-06-03 | Payer: Self-pay | Admitting: Emergency Medicine

## 2008-06-26 ENCOUNTER — Inpatient Hospital Stay (HOSPITAL_COMMUNITY): Admission: EM | Admit: 2008-06-26 | Discharge: 2008-06-28 | Payer: Self-pay | Admitting: Internal Medicine

## 2008-06-26 ENCOUNTER — Ambulatory Visit: Payer: Self-pay | Admitting: Interventional Radiology

## 2008-06-26 ENCOUNTER — Encounter: Payer: Self-pay | Admitting: Emergency Medicine

## 2008-06-27 ENCOUNTER — Encounter (INDEPENDENT_AMBULATORY_CARE_PROVIDER_SITE_OTHER): Payer: Self-pay | Admitting: Internal Medicine

## 2008-08-27 ENCOUNTER — Emergency Department (HOSPITAL_COMMUNITY): Admission: EM | Admit: 2008-08-27 | Discharge: 2008-08-27 | Payer: Self-pay | Admitting: Emergency Medicine

## 2008-09-26 ENCOUNTER — Emergency Department (HOSPITAL_COMMUNITY): Admission: EM | Admit: 2008-09-26 | Discharge: 2008-09-26 | Payer: Self-pay | Admitting: Emergency Medicine

## 2008-10-18 ENCOUNTER — Ambulatory Visit: Payer: Self-pay | Admitting: Pulmonary Disease

## 2008-10-18 DIAGNOSIS — Z8711 Personal history of peptic ulcer disease: Secondary | ICD-10-CM

## 2008-10-18 DIAGNOSIS — C649 Malignant neoplasm of unspecified kidney, except renal pelvis: Secondary | ICD-10-CM

## 2008-10-18 DIAGNOSIS — K861 Other chronic pancreatitis: Secondary | ICD-10-CM | POA: Insufficient documentation

## 2008-10-18 DIAGNOSIS — R0602 Shortness of breath: Secondary | ICD-10-CM

## 2008-10-18 DIAGNOSIS — K7460 Unspecified cirrhosis of liver: Secondary | ICD-10-CM | POA: Insufficient documentation

## 2008-10-18 DIAGNOSIS — F411 Generalized anxiety disorder: Secondary | ICD-10-CM

## 2008-10-18 DIAGNOSIS — K219 Gastro-esophageal reflux disease without esophagitis: Secondary | ICD-10-CM

## 2008-10-25 ENCOUNTER — Emergency Department (HOSPITAL_COMMUNITY): Admission: EM | Admit: 2008-10-25 | Discharge: 2008-10-25 | Payer: Self-pay | Admitting: Emergency Medicine

## 2008-10-30 ENCOUNTER — Emergency Department (HOSPITAL_COMMUNITY): Admission: EM | Admit: 2008-10-30 | Discharge: 2008-10-30 | Payer: Self-pay | Admitting: Emergency Medicine

## 2008-11-14 ENCOUNTER — Emergency Department (HOSPITAL_COMMUNITY): Admission: EM | Admit: 2008-11-14 | Discharge: 2008-11-14 | Payer: Self-pay | Admitting: Emergency Medicine

## 2008-11-14 IMAGING — CT CT ABDOMEN W/ CM
1 of 3 series · 14 of 32 positions shown, 19 images · IV contrast (omnipaque)
Comparison: 11/27/04.

CLINICAL DATA: Pancreatitis.  
 ABDOMEN CT WITH CONTRAST:
TECHNIQUE: Multidetector CT imaging of the abdomen was performed following the standard protocol during bolus administration of intravenous contrast.
 Contrast:  80 cc Omnipaque 300
TECHNIQUE: Multidetector CT imaging of the pelvis was performed following the standard protocol during bolus administration of intravenous contrast.

[Series 2: abd_pel 5.0 b40f st · axial · 0.59mm/px · z∈[+1330,+1685]mm · 14 of 81 slices shown, 19 images]
[im 5/81  soft-tissue]
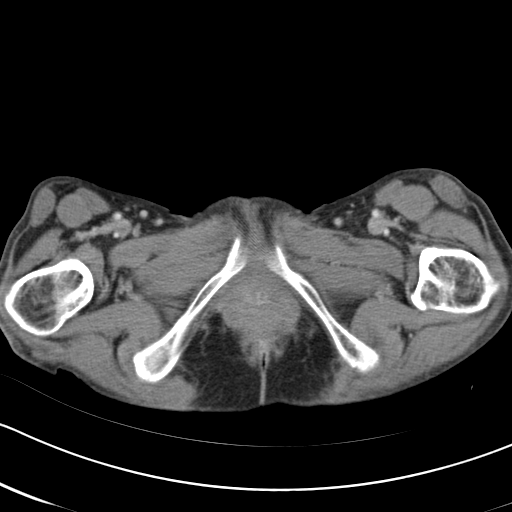
[im 5/81  bone]
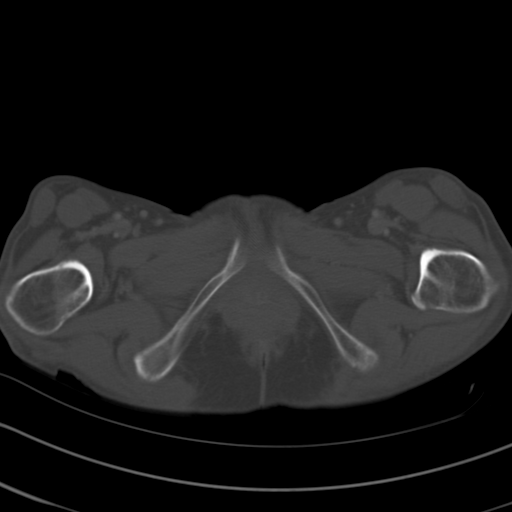
[im 13/81  soft-tissue]
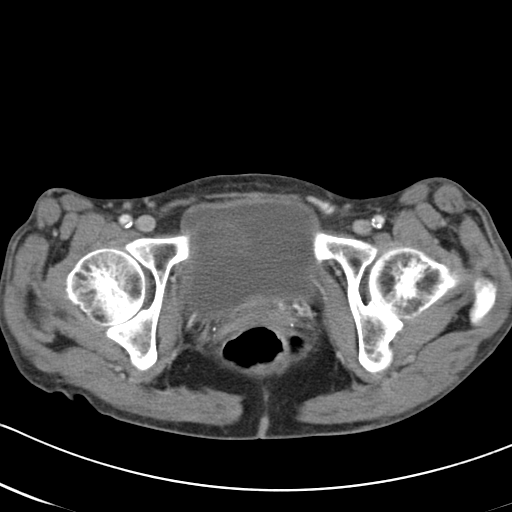
[im 17/81  soft-tissue]
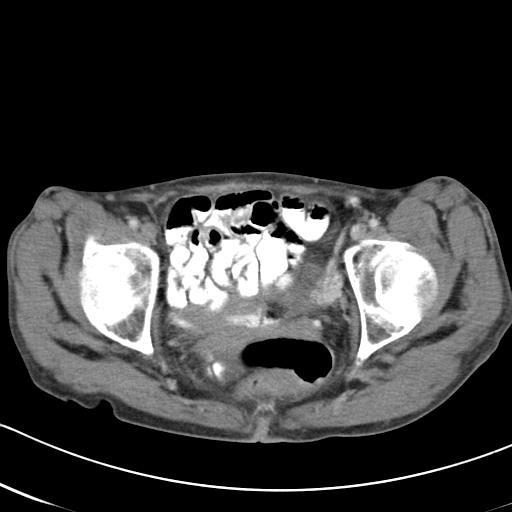
[im 22/81  soft-tissue]
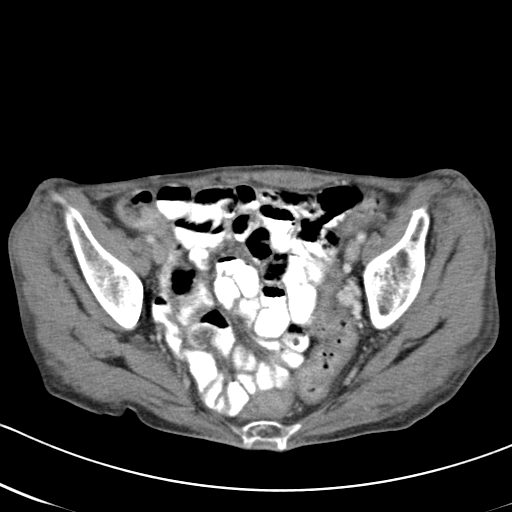
[im 30/81  soft-tissue]
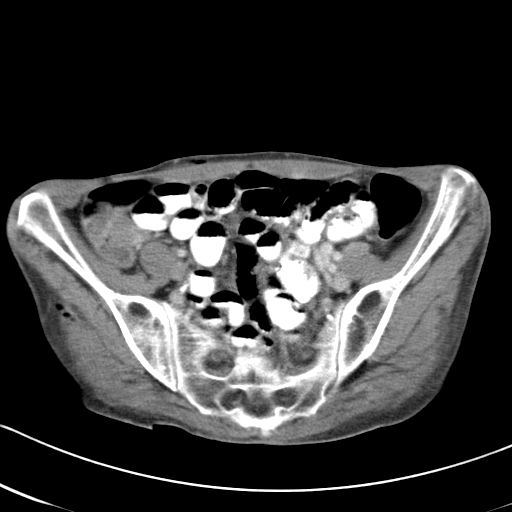
[im 34/81  soft-tissue]
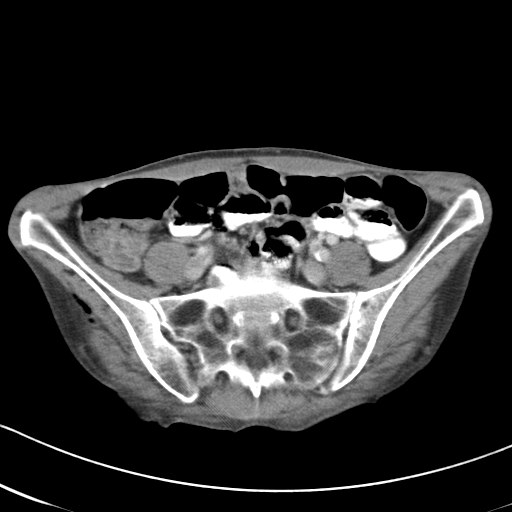
[im 43/81  soft-tissue]
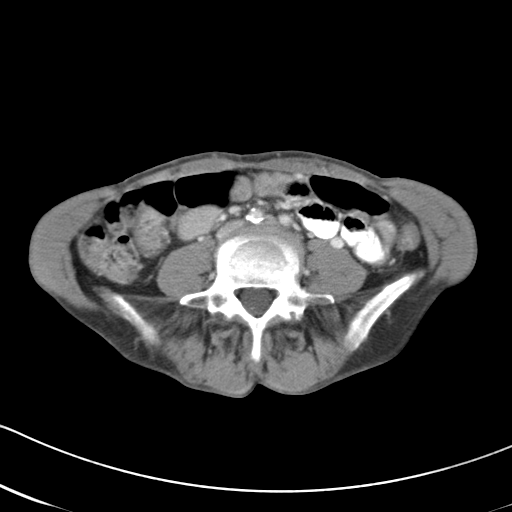
[im 47/81  soft-tissue]
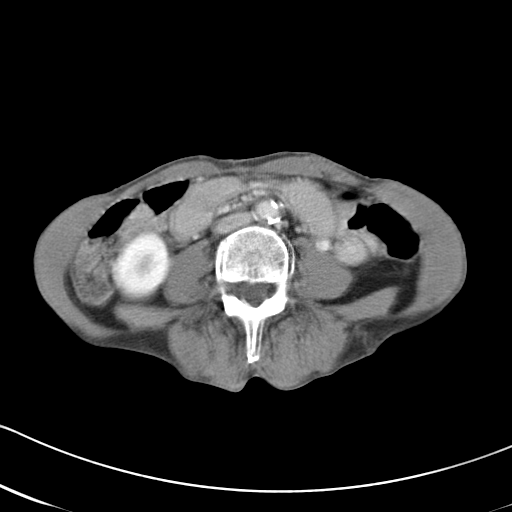
[im 51/81  soft-tissue]
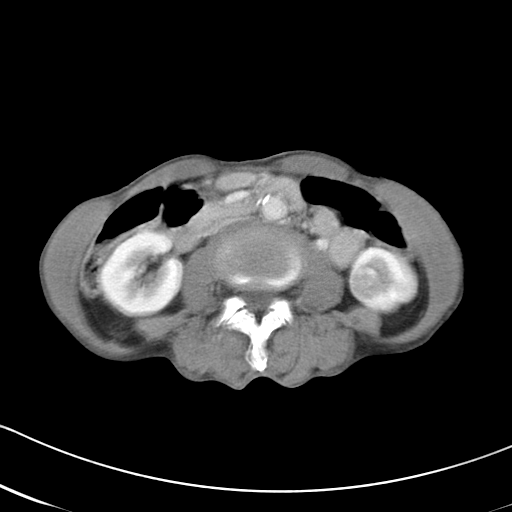
[im 51/81  bone]
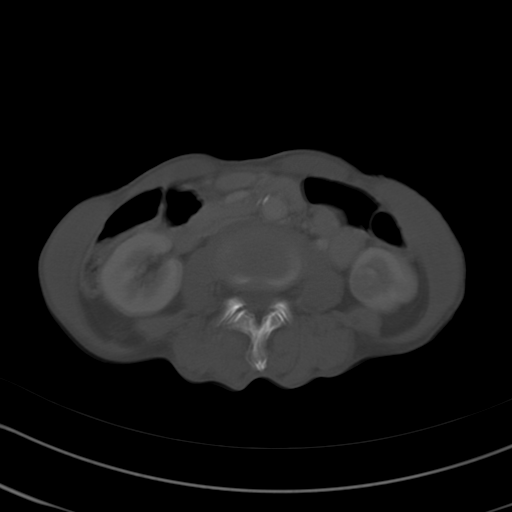
[im 59/81  soft-tissue]
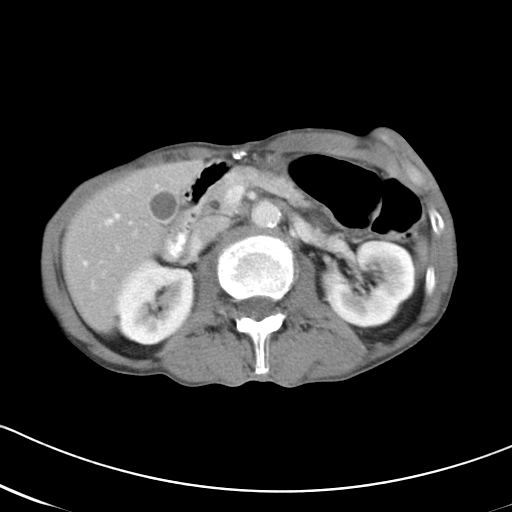
[im 64/81  soft-tissue]
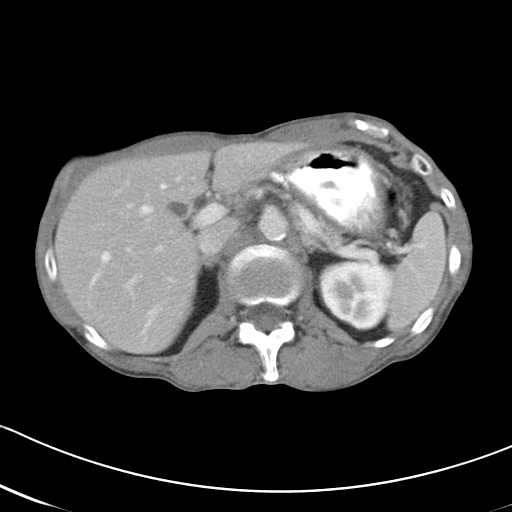
[im 64/81  lung]
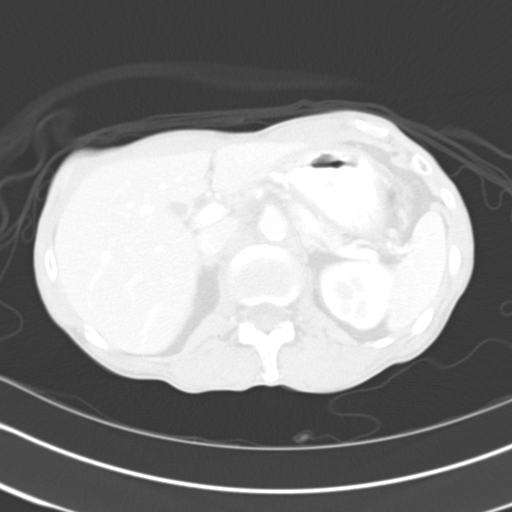
[im 68/81  soft-tissue]
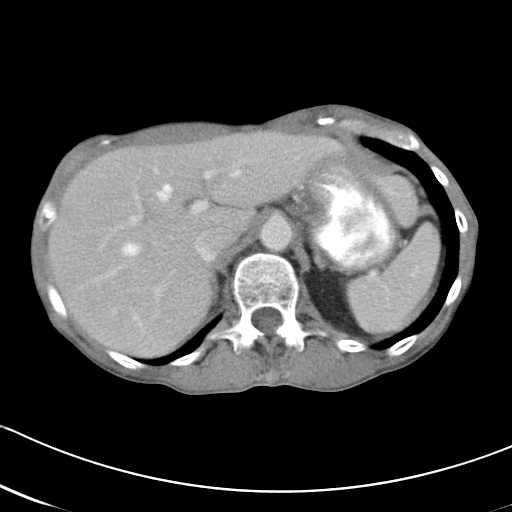
[im 68/81  lung]
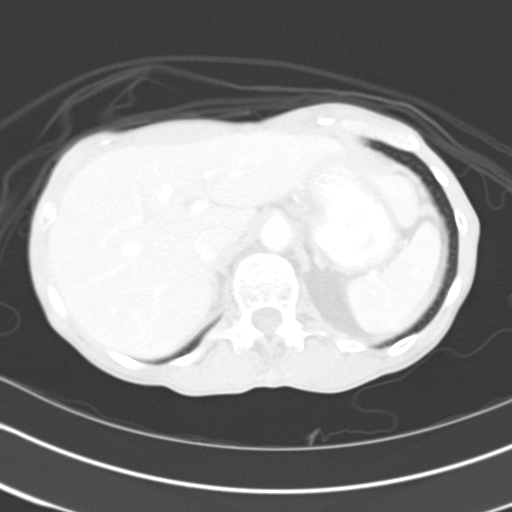
[im 72/81  lung]
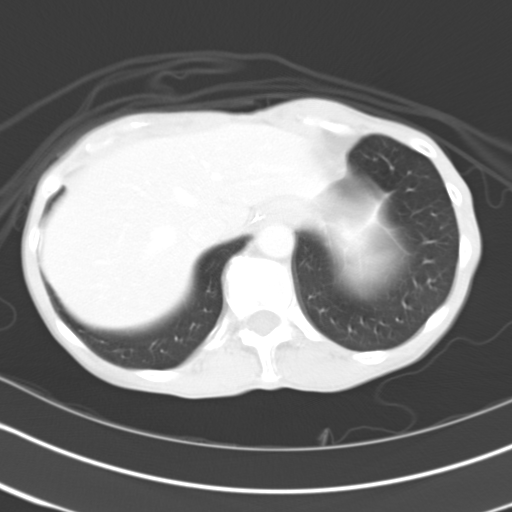
[im 76/81  soft-tissue]
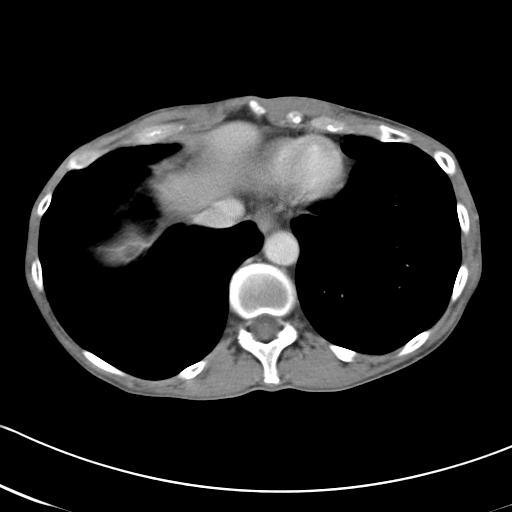
[im 76/81  lung]
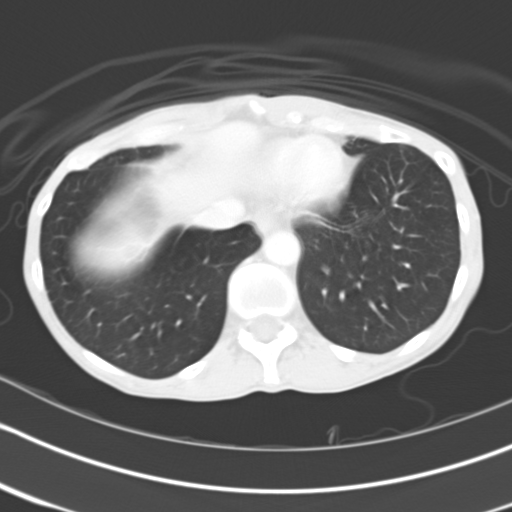

[14 of 32 positions shown; findings below may reference images not displayed]

FINDINGS: Lung bases are clear.  The liver has a normal appearance without focal lesions or biliary ductal dilatation.  No calcified gallstones.  Spleen is normal.  No abnormality of the pancreas is seen.  Adrenal glands are normal.  The kidneys are normal.  Aorta shows atherosclerotic change but no aneurysm.  No IVC pathology.  No retroperitoneal mass or adenopathy.  No free intraperitoneal fluid or air.  No bowel pathology is seen.
IMPRESSION: Negative CT scan of the abdomen. 
 PELVIS CT WITH CONTRAST:
FINDINGS: No free fluid in the pelvis.  No sign of mass or adenopathy.  No bowel pathology evident.
IMPRESSION: Negative CT scan of the pelvis.

## 2008-11-14 IMAGING — XA IR FLUORO GUIDE CV LINE*L*
1 series · 2 of 2 positions shown · non-contrast
Comparison: none

CLINICAL DATA: 55 year old female with acute pancreatitis.  PICC line has been requested for central venous access.
 LEFT UPPER EXTREMITY PICC PLACEMENT WITH ULTRASOUND AND FLUOROSCOPIC GUIDANCE ? 05/02/06: 
 Procedure:  The left arm was prepped with Betadine, draped in the usual sterile fashion, and infiltrated locally with 1% lidocaine.  Ultrasound demonstrated patency of the left brachial vein.  Under real-time ultrasound guidance, this vein was accessed with a 21 gauge micropuncture needle.  Ultrasound image documentation was performed.  The needle was exchanged over a guidewire for a Peel-Away sheath, through which a 5 French dual-lumen Power PICC catheter trimmed to 41 cm was advanced, positioned with its tip at the distal SVC/right atrial junction.  Fluoroscopy during the procedure and fluoroscopic spot radiograph confirm appropriate catheter tip position.  The catheter was flushed, secured to the skin with Prolene sutures, and covered with a sterile dressing.  No immediate complications.
 Fluoro Time:   0.2 minutes.

[Series 1000: run · 0.14mm/px · 2 of 2 slices shown]
[im 1/2]
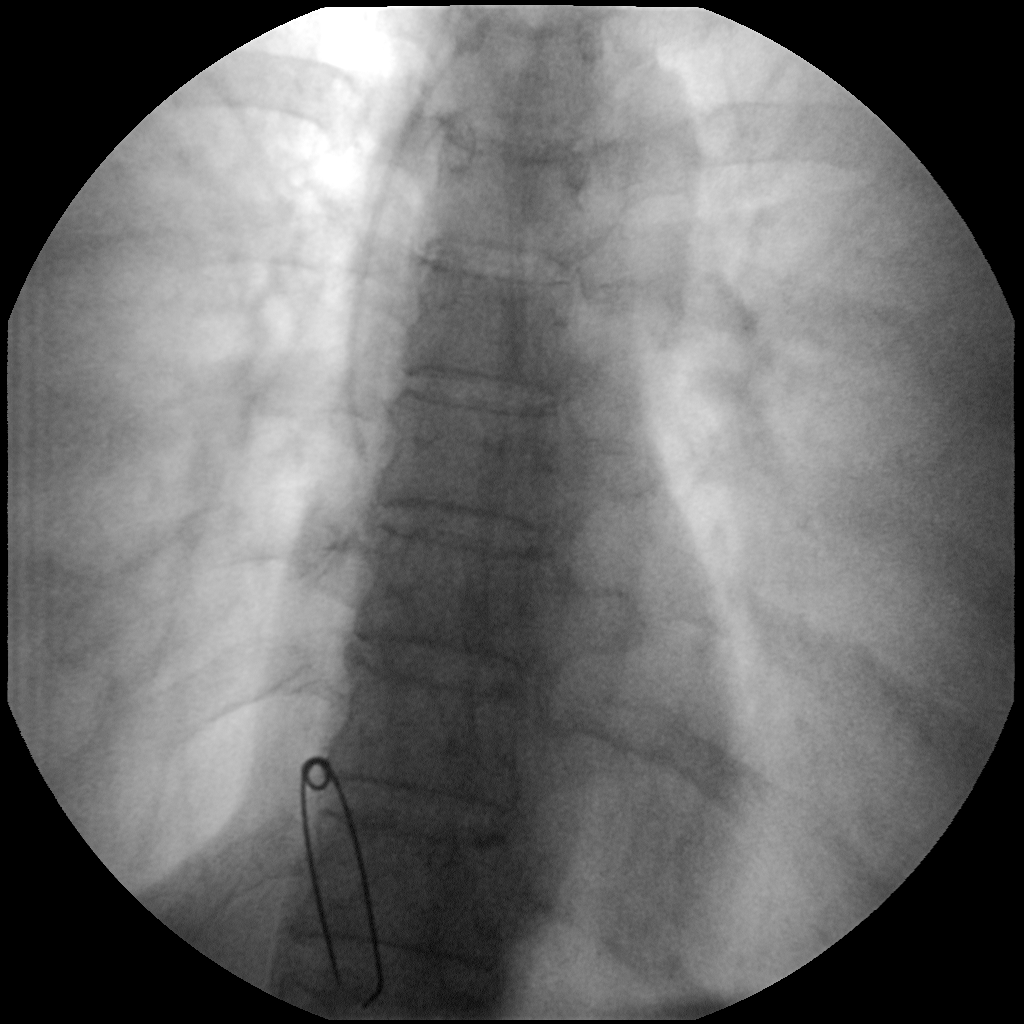
[im 2/2]
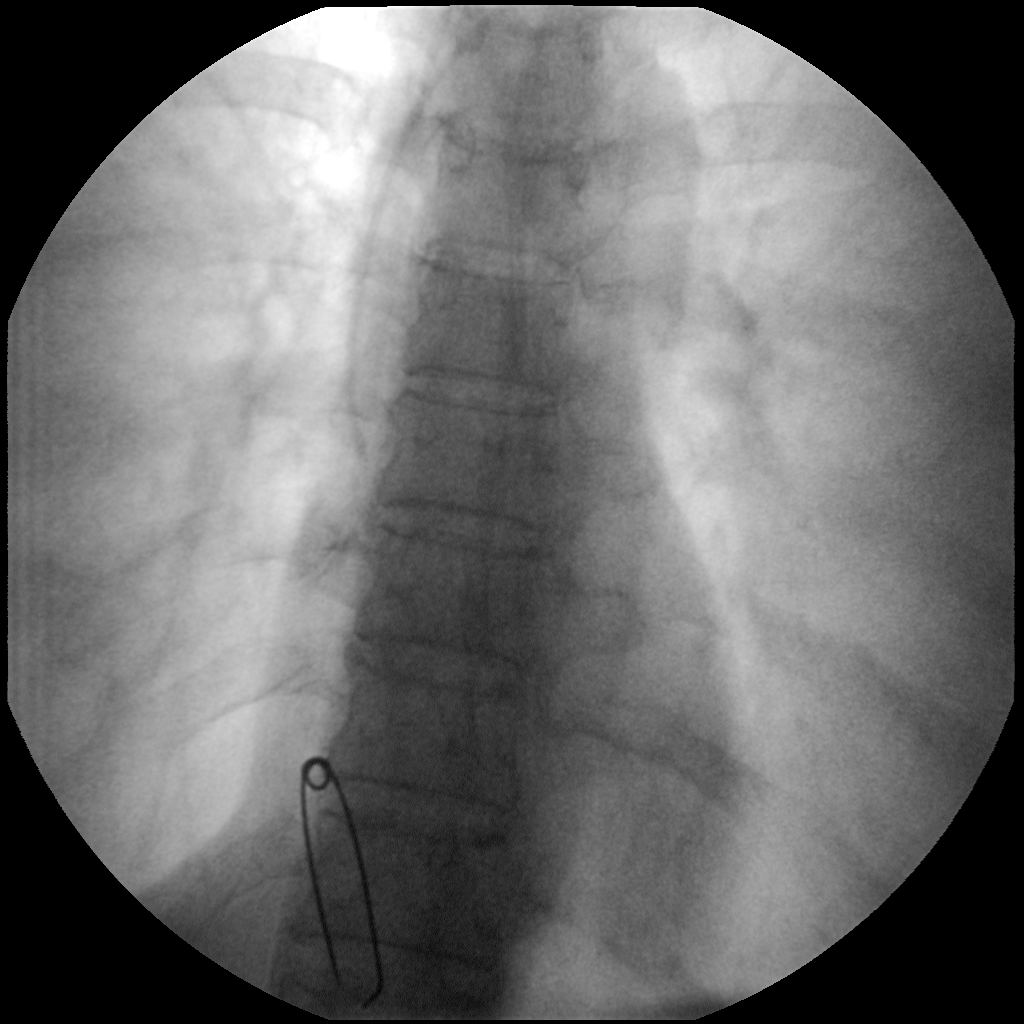

[2 of 2 positions shown; findings below may reference images not displayed]

IMPRESSION: Technically successful left arm Power PICC placement with ultrasound and fluoroscopic guidance.  Ready for routine use.

## 2008-11-14 IMAGING — US IR US GUIDE VASC ACCESS LEFT
1 series · 1 of 1 positions shown · non-contrast
Comparison: none

CLINICAL DATA: 55 year old female with acute pancreatitis.  PICC line has been requested for central venous access.
 LEFT UPPER EXTREMITY PICC PLACEMENT WITH ULTRASOUND AND FLUOROSCOPIC GUIDANCE ? 05/02/06: 
 Procedure:  The left arm was prepped with Betadine, draped in the usual sterile fashion, and infiltrated locally with 1% lidocaine.  Ultrasound demonstrated patency of the left brachial vein.  Under real-time ultrasound guidance, this vein was accessed with a 21 gauge micropuncture needle.  Ultrasound image documentation was performed.  The needle was exchanged over a guidewire for a Peel-Away sheath, through which a 5 French dual-lumen Power PICC catheter trimmed to 41 cm was advanced, positioned with its tip at the distal SVC/right atrial junction.  Fluoroscopy during the procedure and fluoroscopic spot radiograph confirm appropriate catheter tip position.  The catheter was flushed, secured to the skin with Prolene sutures, and covered with a sterile dressing.  No immediate complications.
 Fluoro Time:   0.2 minutes.

[Series 1: sp us guide vasc access*left* · 1 of 1 slices shown]
[im 1/1]
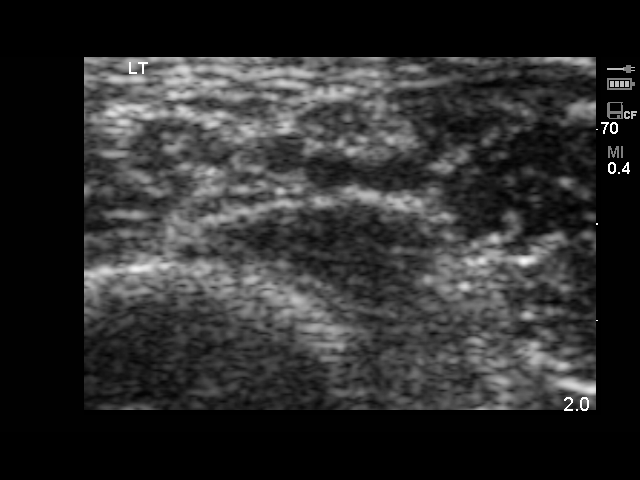

[1 of 1 positions shown; findings below may reference images not displayed]

IMPRESSION: Technically successful left arm Power PICC placement with ultrasound and fluoroscopic guidance.  Ready for routine use.

## 2008-11-14 IMAGING — CR DG ABDOMEN ACUTE W/ 1V CHEST
4 series · 4 of 4 positions shown · non-contrast
Comparison: Chest x-ray 03/08/06.

CLINICAL DATA: Abdominal pain and vomiting.  
 ACUTE ABDOMEN WITH CHEST ? 3 VIEWS:

[w chest pa]
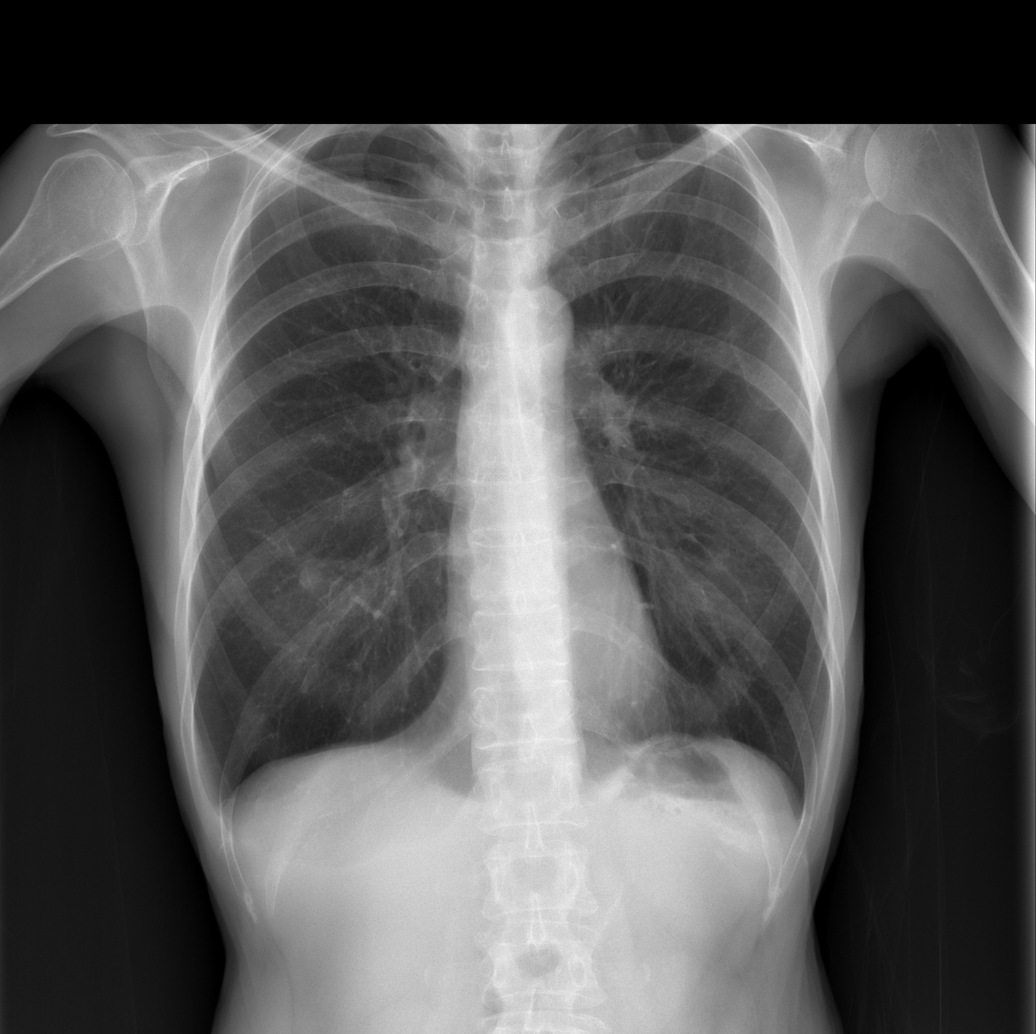

[w abdomen upright *]
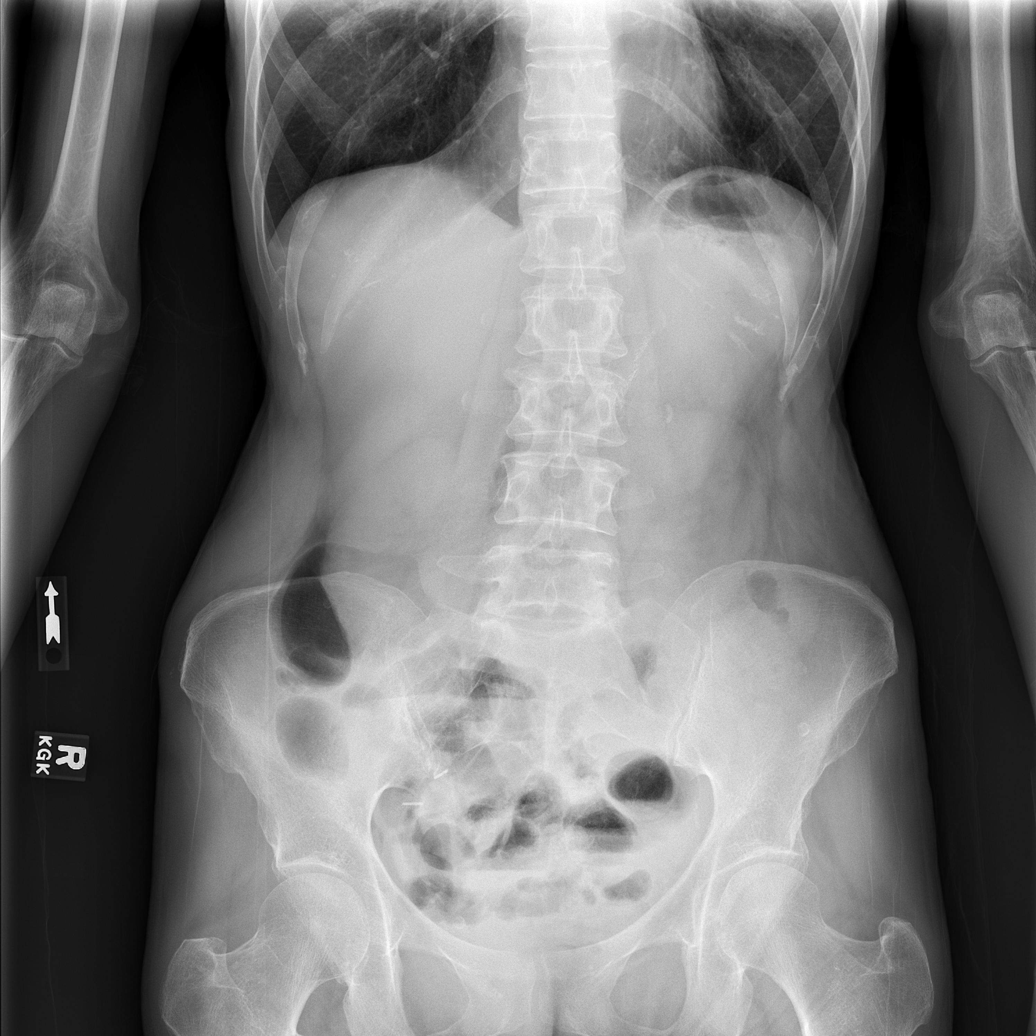

[t abdomen supine (1 of 2)]
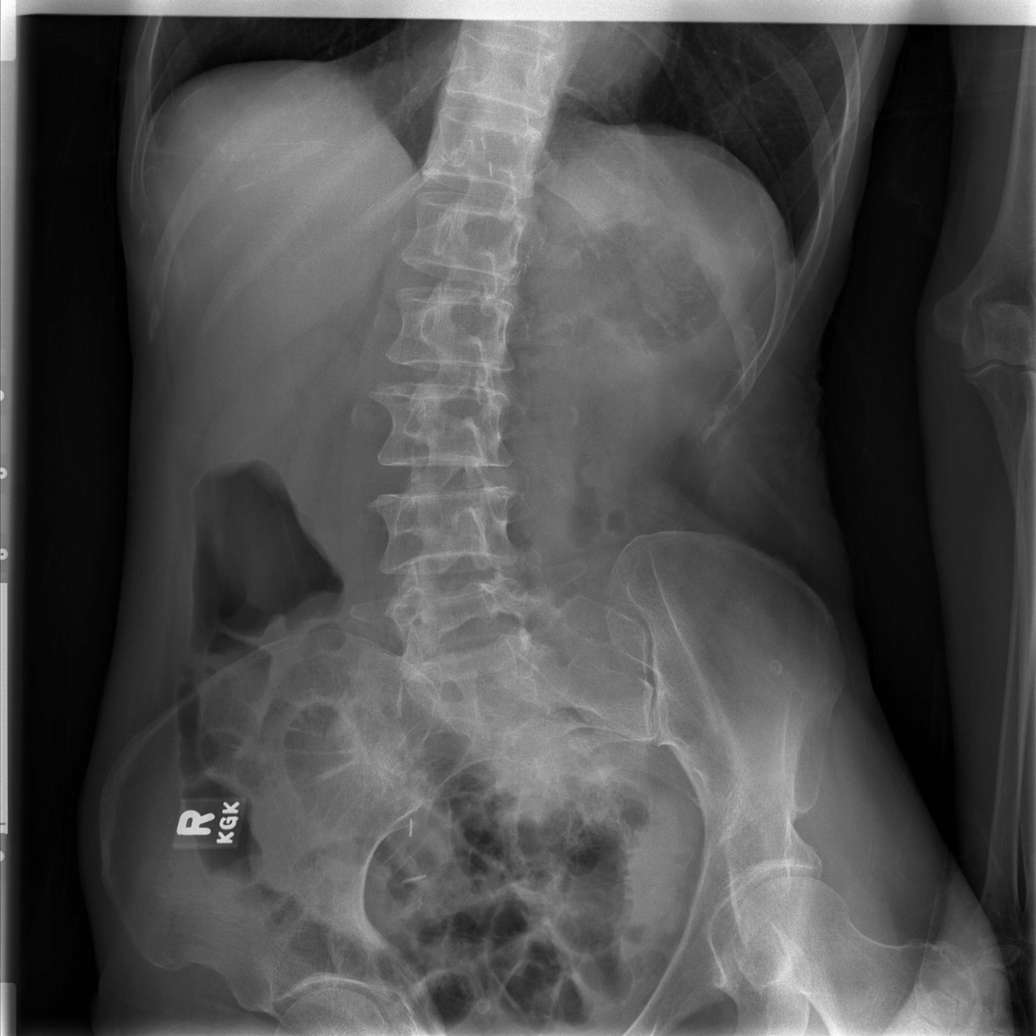

[t abdomen supine (2 of 2)]
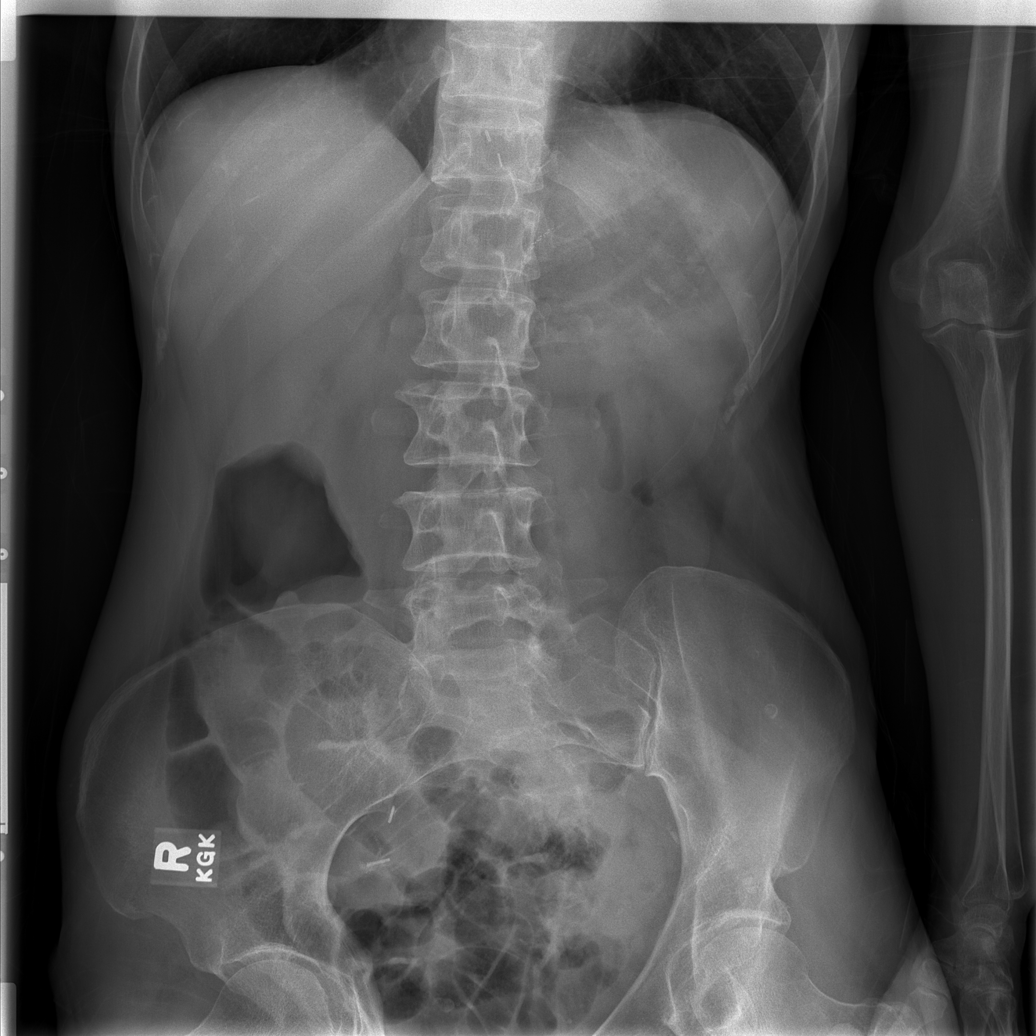

[4 of 4 positions shown; findings below may reference images not displayed]

FINDINGS: The lungs are hyperinflated. There is no infiltrate or effusion.  A nipple shadow on the right is noted.  It was not visualized on the prior study however, has a typical appearance for a nipple shadow.  
 In the abdomen, there is a nonobstructive bowel gas pattern.  No free air.  Surgical clips are present in the epigastric region around the stomach and in the right pelvis.
IMPRESSION: 1.  COPD.  No acute abnormality.
 2.  Nonobstructive bowel gas pattern.

## 2008-11-20 IMAGING — CR DG CHEST 2V
2 series · 2 of 2 positions shown · non-contrast
Comparison: 05/02/06.

CLINICAL DATA: Pancreatitis.  Shortness of breath and cough. 
 CHEST ? 2 VIEW ? 05/08/06:

[w chest pa]
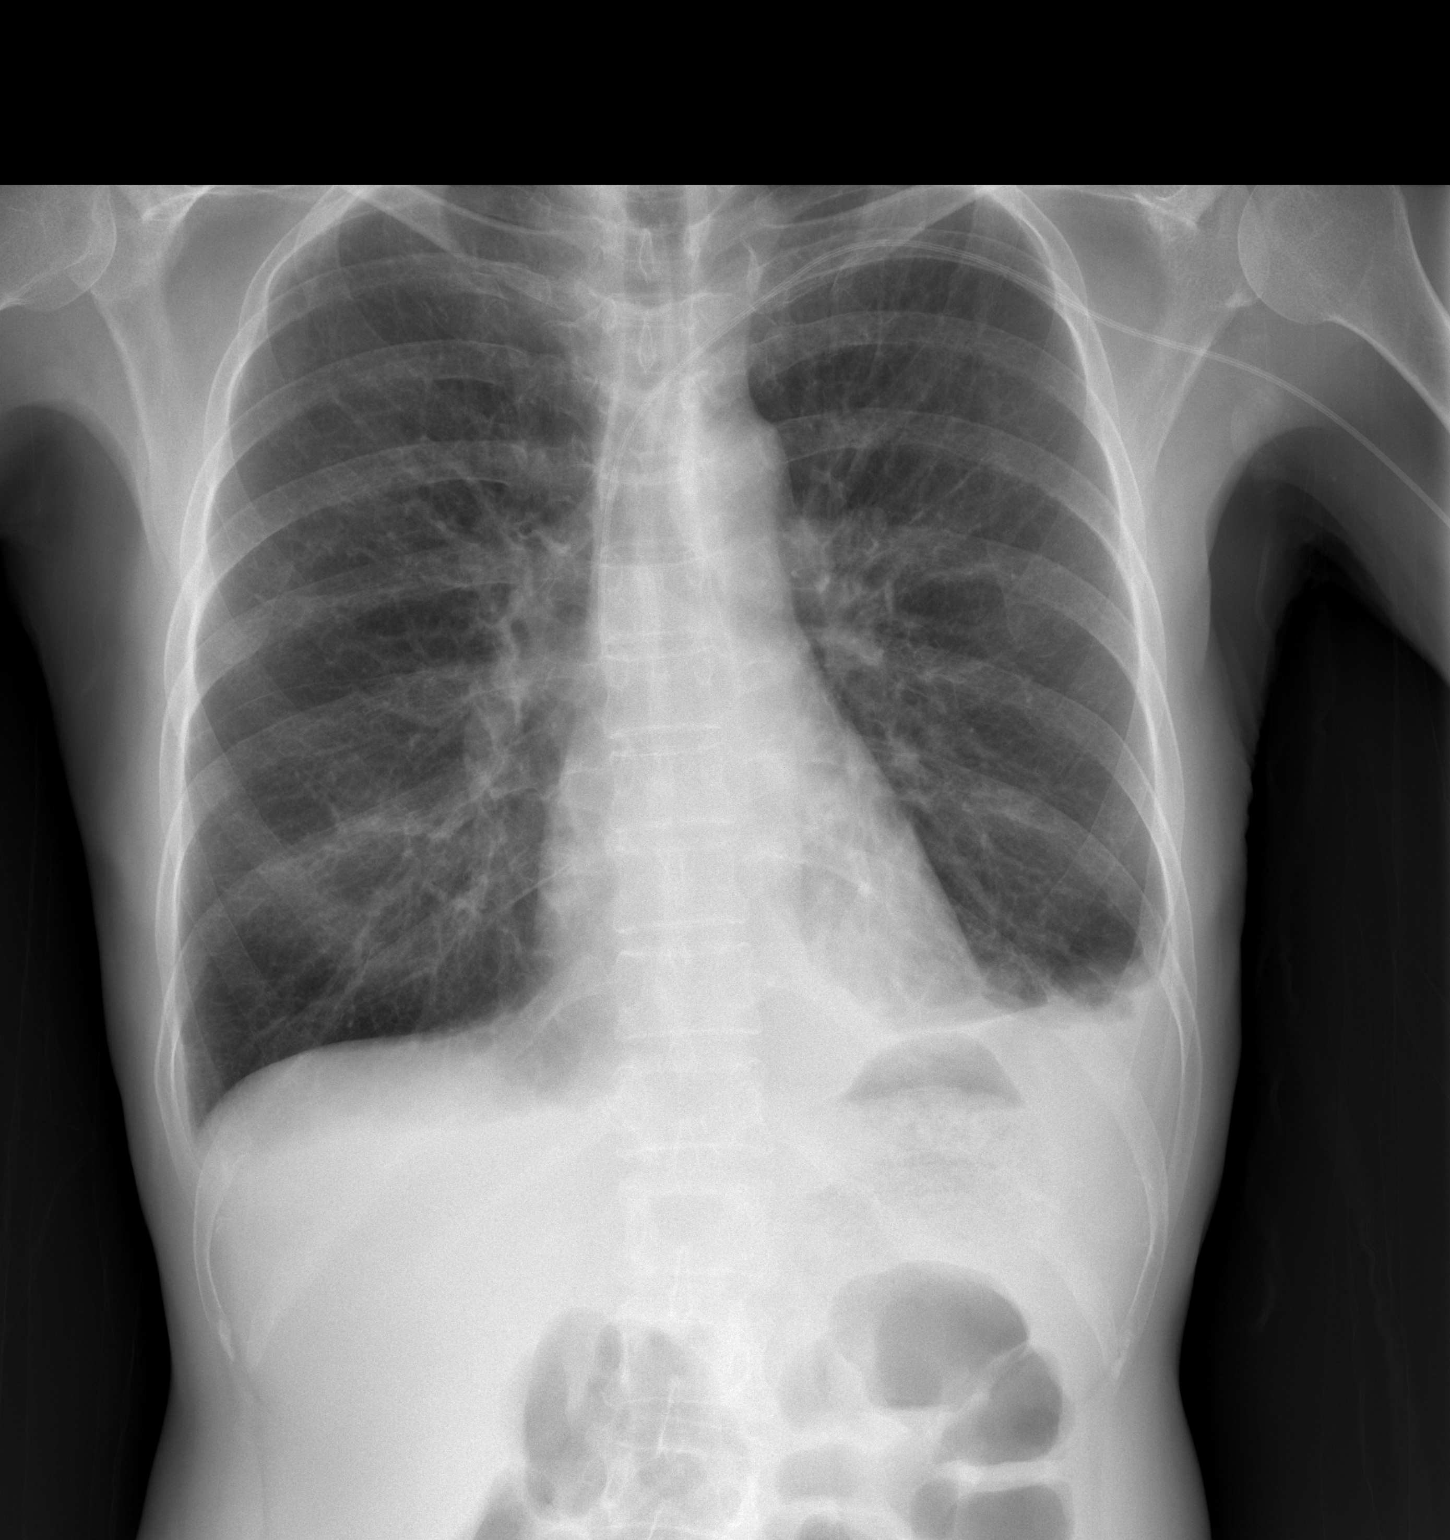

[w chest lat]
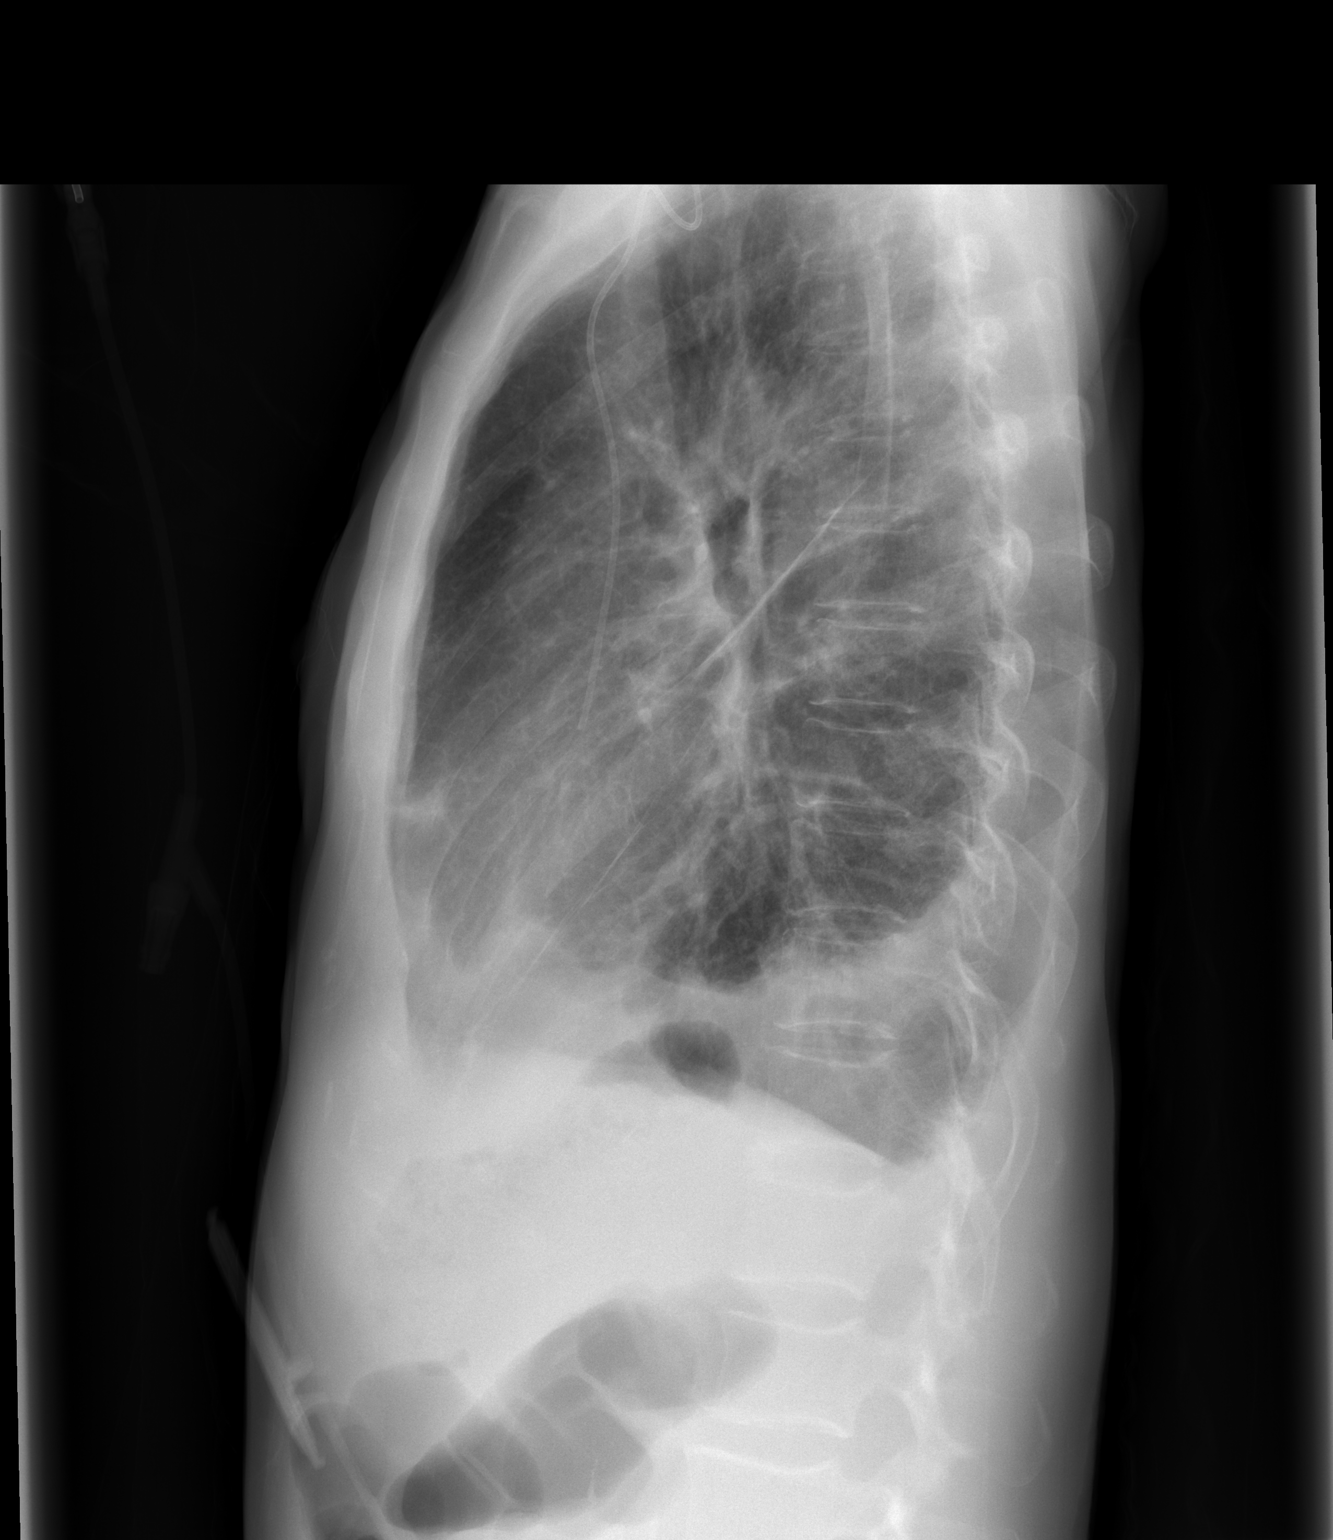

[2 of 2 positions shown; findings below may reference images not displayed]

FINDINGS: The patient has developed a new small left pleural effusion.  There also is associated atelectasis at the left lung base.  Otherwise stable chronic changes in the lungs.  Normal heart size.
IMPRESSION: Development of small left pleural effusion.  Left lower lobe atelectasis is present.

## 2008-12-09 ENCOUNTER — Emergency Department (HOSPITAL_COMMUNITY): Admission: EM | Admit: 2008-12-09 | Discharge: 2008-12-09 | Payer: Self-pay | Admitting: Emergency Medicine

## 2008-12-20 ENCOUNTER — Emergency Department (HOSPITAL_COMMUNITY): Admission: EM | Admit: 2008-12-20 | Discharge: 2008-12-20 | Payer: Self-pay | Admitting: Emergency Medicine

## 2008-12-24 ENCOUNTER — Emergency Department (HOSPITAL_COMMUNITY): Admission: EM | Admit: 2008-12-24 | Discharge: 2008-12-24 | Payer: Self-pay | Admitting: Emergency Medicine

## 2008-12-30 ENCOUNTER — Emergency Department (HOSPITAL_COMMUNITY): Admission: EM | Admit: 2008-12-30 | Discharge: 2008-12-30 | Payer: Self-pay | Admitting: Emergency Medicine

## 2009-01-01 ENCOUNTER — Emergency Department (HOSPITAL_COMMUNITY): Admission: EM | Admit: 2009-01-01 | Discharge: 2009-01-01 | Payer: Self-pay | Admitting: Emergency Medicine

## 2009-01-01 ENCOUNTER — Emergency Department (HOSPITAL_COMMUNITY): Admission: EM | Admit: 2009-01-01 | Discharge: 2009-01-02 | Payer: Self-pay | Admitting: Emergency Medicine

## 2009-02-12 IMAGING — US IR US GUIDE VASC ACCESS LEFT
1 series · 1 of 1 positions shown · non-contrast
Comparison: none

CLINICAL DATA: 55-year-old female with left hip fracture.  No access for preoperative pain management. 
 ULTRASOUND GUIDANCE FOR VASCULAR ACCESS: 
 LEFT UPPER EXTREMITY DOUBLE-LUMEN POWER PICC LINE INSERTION:
TECHNIQUE: The left arm was prepped with Betadine, draped in the usual sterile fashion, and infiltrated locally with 1% Lidocaine. Ultrasound demonstrated patency of the basilic vein. Under real-time ultrasound guidance, this vein was accessed with a 21-gauge micropuncture needle. Ultrasound image documentation was performed. The needle was exchanged over a guidewire for a peel-away sheath through which a 5-French double-lumen Power PICC catheter trimmed to 39 cm was advanced, positioned with its tip at the cavoatrial junction. Fluoroscopy during the procedure and fluoro spot radiograph confirms appropriate catheter position. The catheter was flushed, secured to the skin with Prolene sutures, and covered with a sterile dressing.  No immediate complication.

[Series 1: sp us guide vasc access*right* · 1 of 1 slices shown]
[im 1/1]
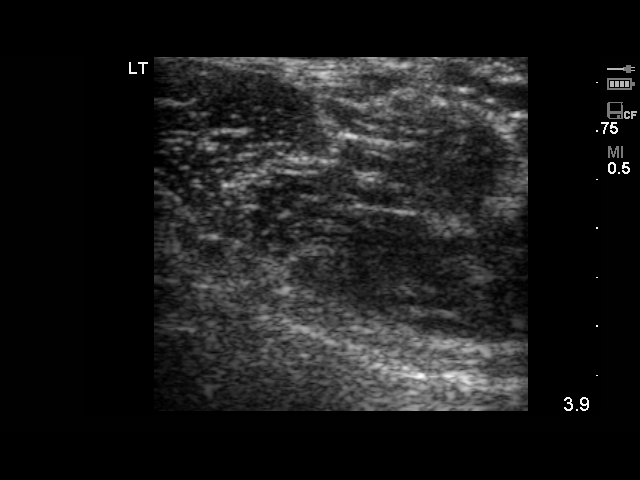

[1 of 1 positions shown; findings below may reference images not displayed]

IMPRESSION: Technically successful left arm PICC placement with ultrasound and fluoroscopic guidance. Ready for routine use.

## 2009-02-12 IMAGING — CR DG CHEST 1V
1 series · 1 of 1 positions shown · non-contrast
Comparison: 05/08/2006.

CLINICAL DATA: Left hip fracture following a fall.

CHEST - 1 VIEW

[t chest supine]
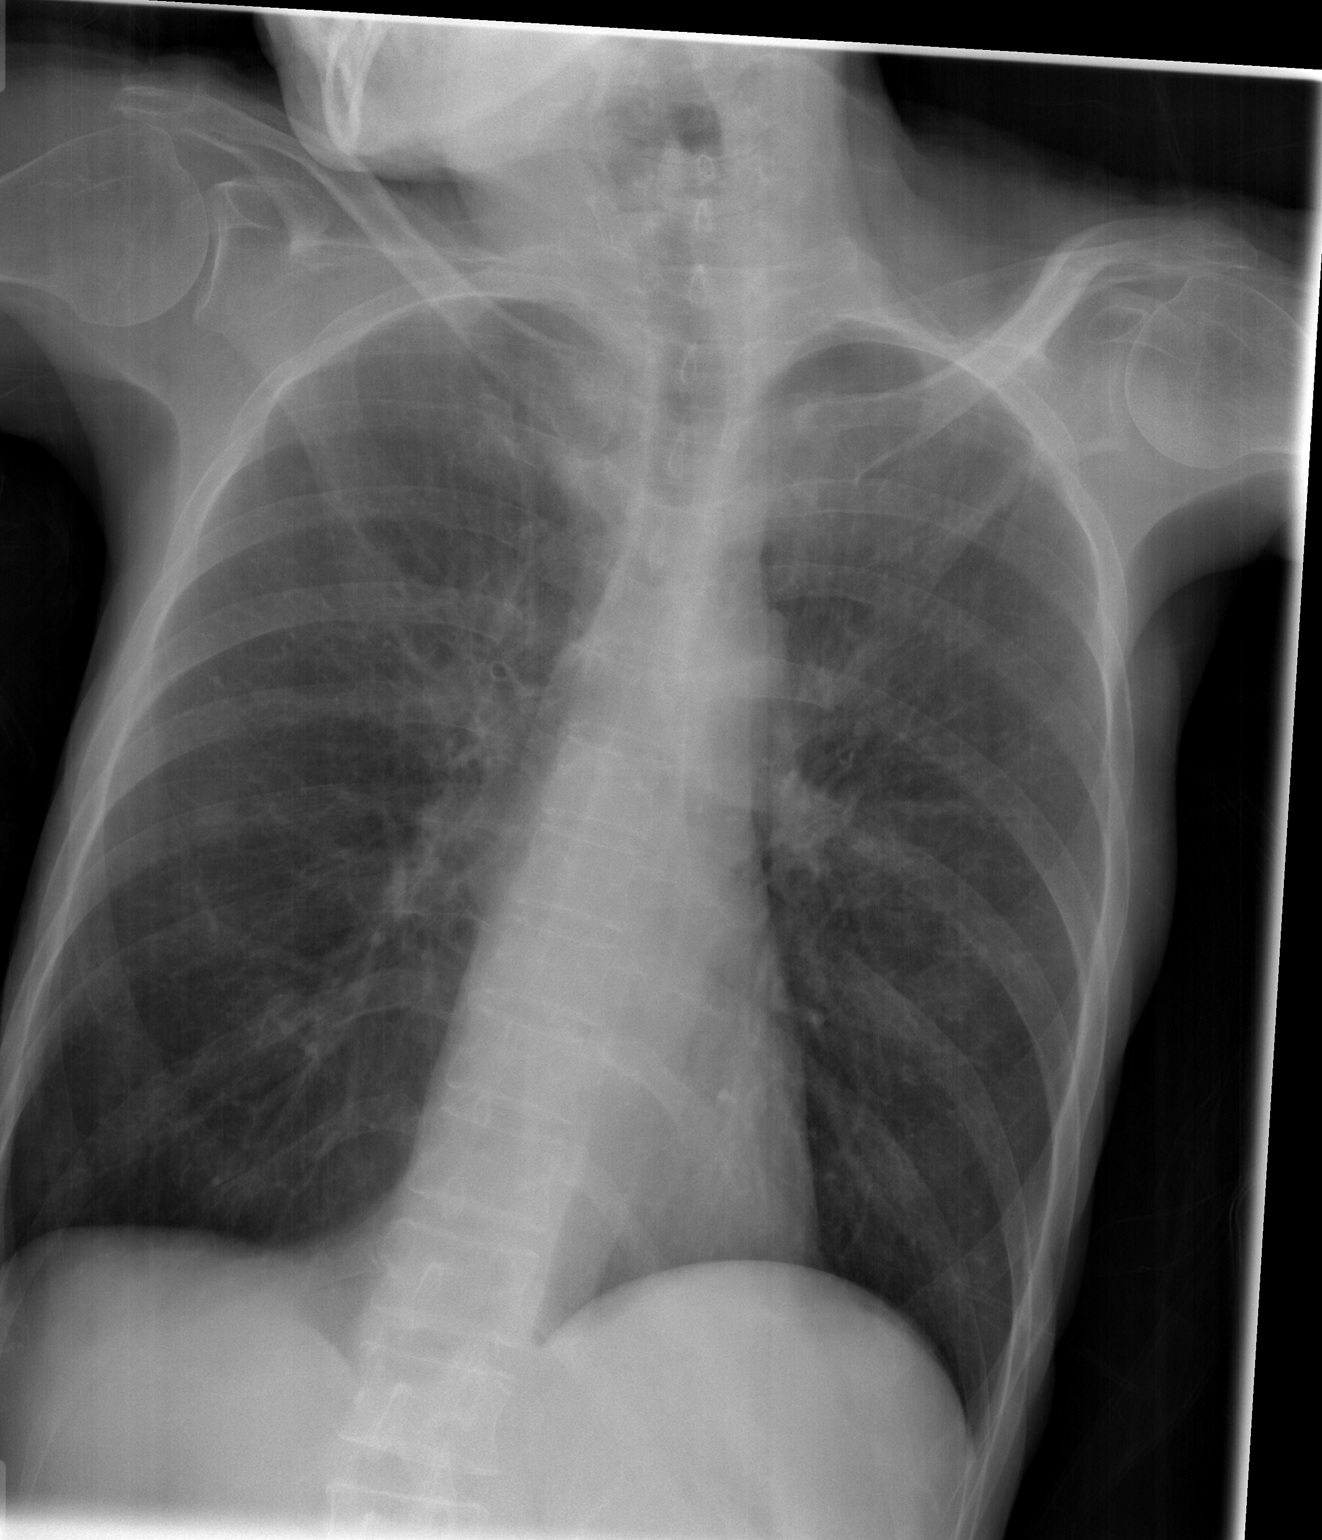

[1 of 1 positions shown; findings below may reference images not displayed]

FINDINGS: Stable normal sized heart and changes of COPD and chronic bronchitis.
The right lateral costophrenic angle is not currently included. Diffuse
osteopenia.

IMPRESSION

Stable changes of COPD and chronic bronchitis. No acute abnormality.

## 2009-02-12 IMAGING — XA IR FLUORO GUIDE CV LINE*L*
1 series · 1 of 1 positions shown · non-contrast
Comparison: none

CLINICAL DATA: 55-year-old female with left hip fracture.  No access for preoperative pain management. 
 ULTRASOUND GUIDANCE FOR VASCULAR ACCESS: 
 LEFT UPPER EXTREMITY DOUBLE-LUMEN POWER PICC LINE INSERTION:
TECHNIQUE: The left arm was prepped with Betadine, draped in the usual sterile fashion, and infiltrated locally with 1% Lidocaine. Ultrasound demonstrated patency of the basilic vein. Under real-time ultrasound guidance, this vein was accessed with a 21-gauge micropuncture needle. Ultrasound image documentation was performed. The needle was exchanged over a guidewire for a peel-away sheath through which a 5-French double-lumen Power PICC catheter trimmed to 39 cm was advanced, positioned with its tip at the cavoatrial junction. Fluoroscopy during the procedure and fluoro spot radiograph confirms appropriate catheter position. The catheter was flushed, secured to the skin with Prolene sutures, and covered with a sterile dressing.  No immediate complication.

[Series 1000: run · 0.15mm/px · 1 of 1 slices shown]
[im 1/1]
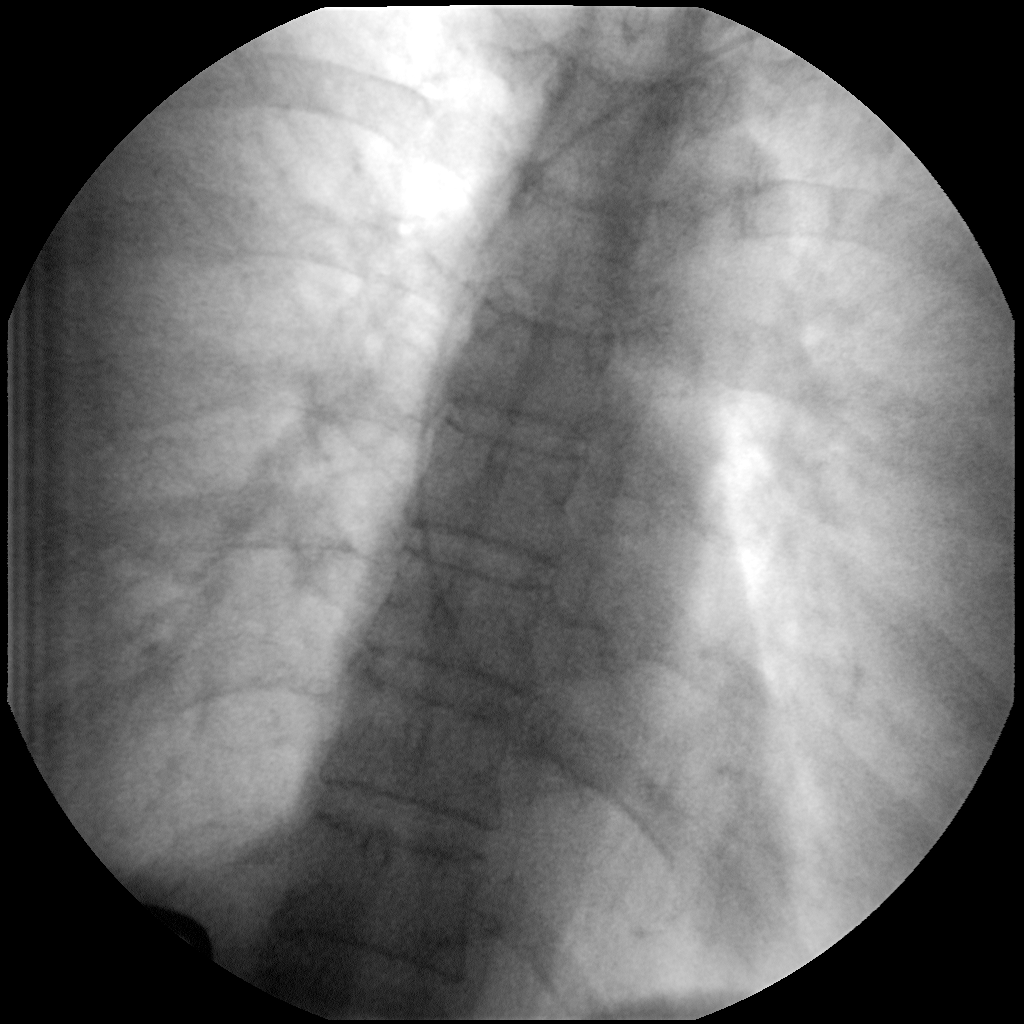

[1 of 1 positions shown; findings below may reference images not displayed]

IMPRESSION: Technically successful left arm PICC placement with ultrasound and fluoroscopic guidance. Ready for routine use.

## 2009-02-12 IMAGING — RF DG HIP OPERATIVE*L*
1 series · 4 of 4 positions shown · non-contrast
Comparison: none

CLINICAL DATA: ORIF left hip fracture. 
C-ARM FLUOROSCOPY:

[Series 1: run · 4 of 4 slices shown]
[im 1/4]
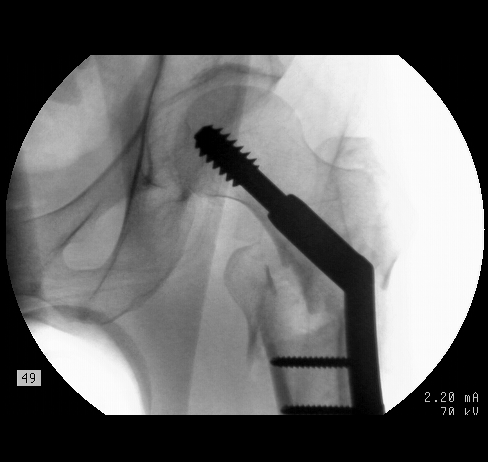
[im 2/4]
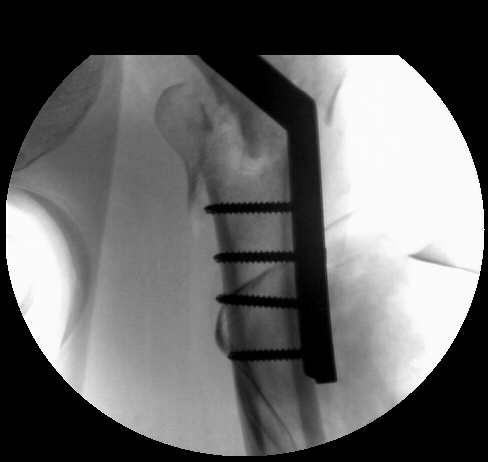
[im 3/4]
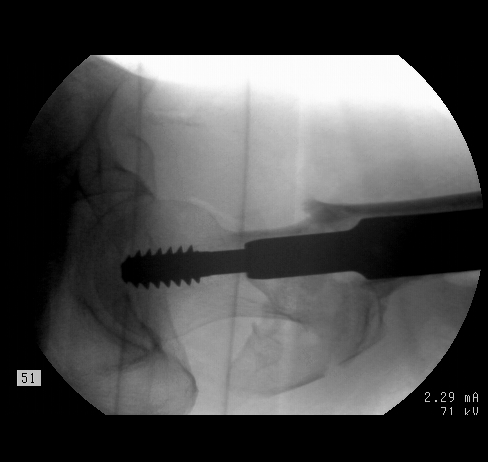
[im 4/4]
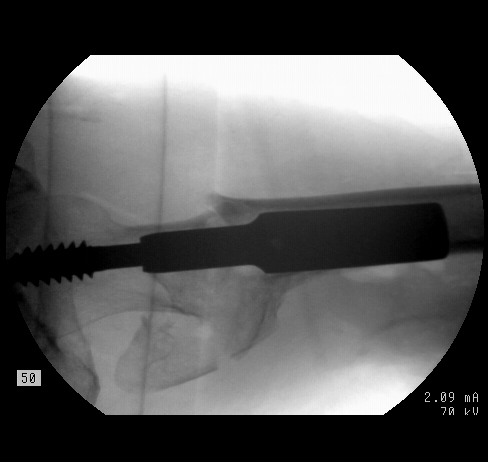

[4 of 4 positions shown; findings below may reference images not displayed]

FINDINGS: Four C-arm spot films were obtained.  These show screw and plate fixation of the left intertrochanteric femoral fracture. 
IMPRESSION
Fixation of left intertrochanteric femoral fracture.

## 2009-02-12 IMAGING — CR DG HIP (WITH OR WITHOUT PELVIS) 2-3V*L*
3 series · 3 of 3 positions shown · non-contrast
Comparison: none

CLINICAL DATA: Left hip pain following a fall.

LEFT HIP - 3 VIEW:

[t pelvis a.p.]
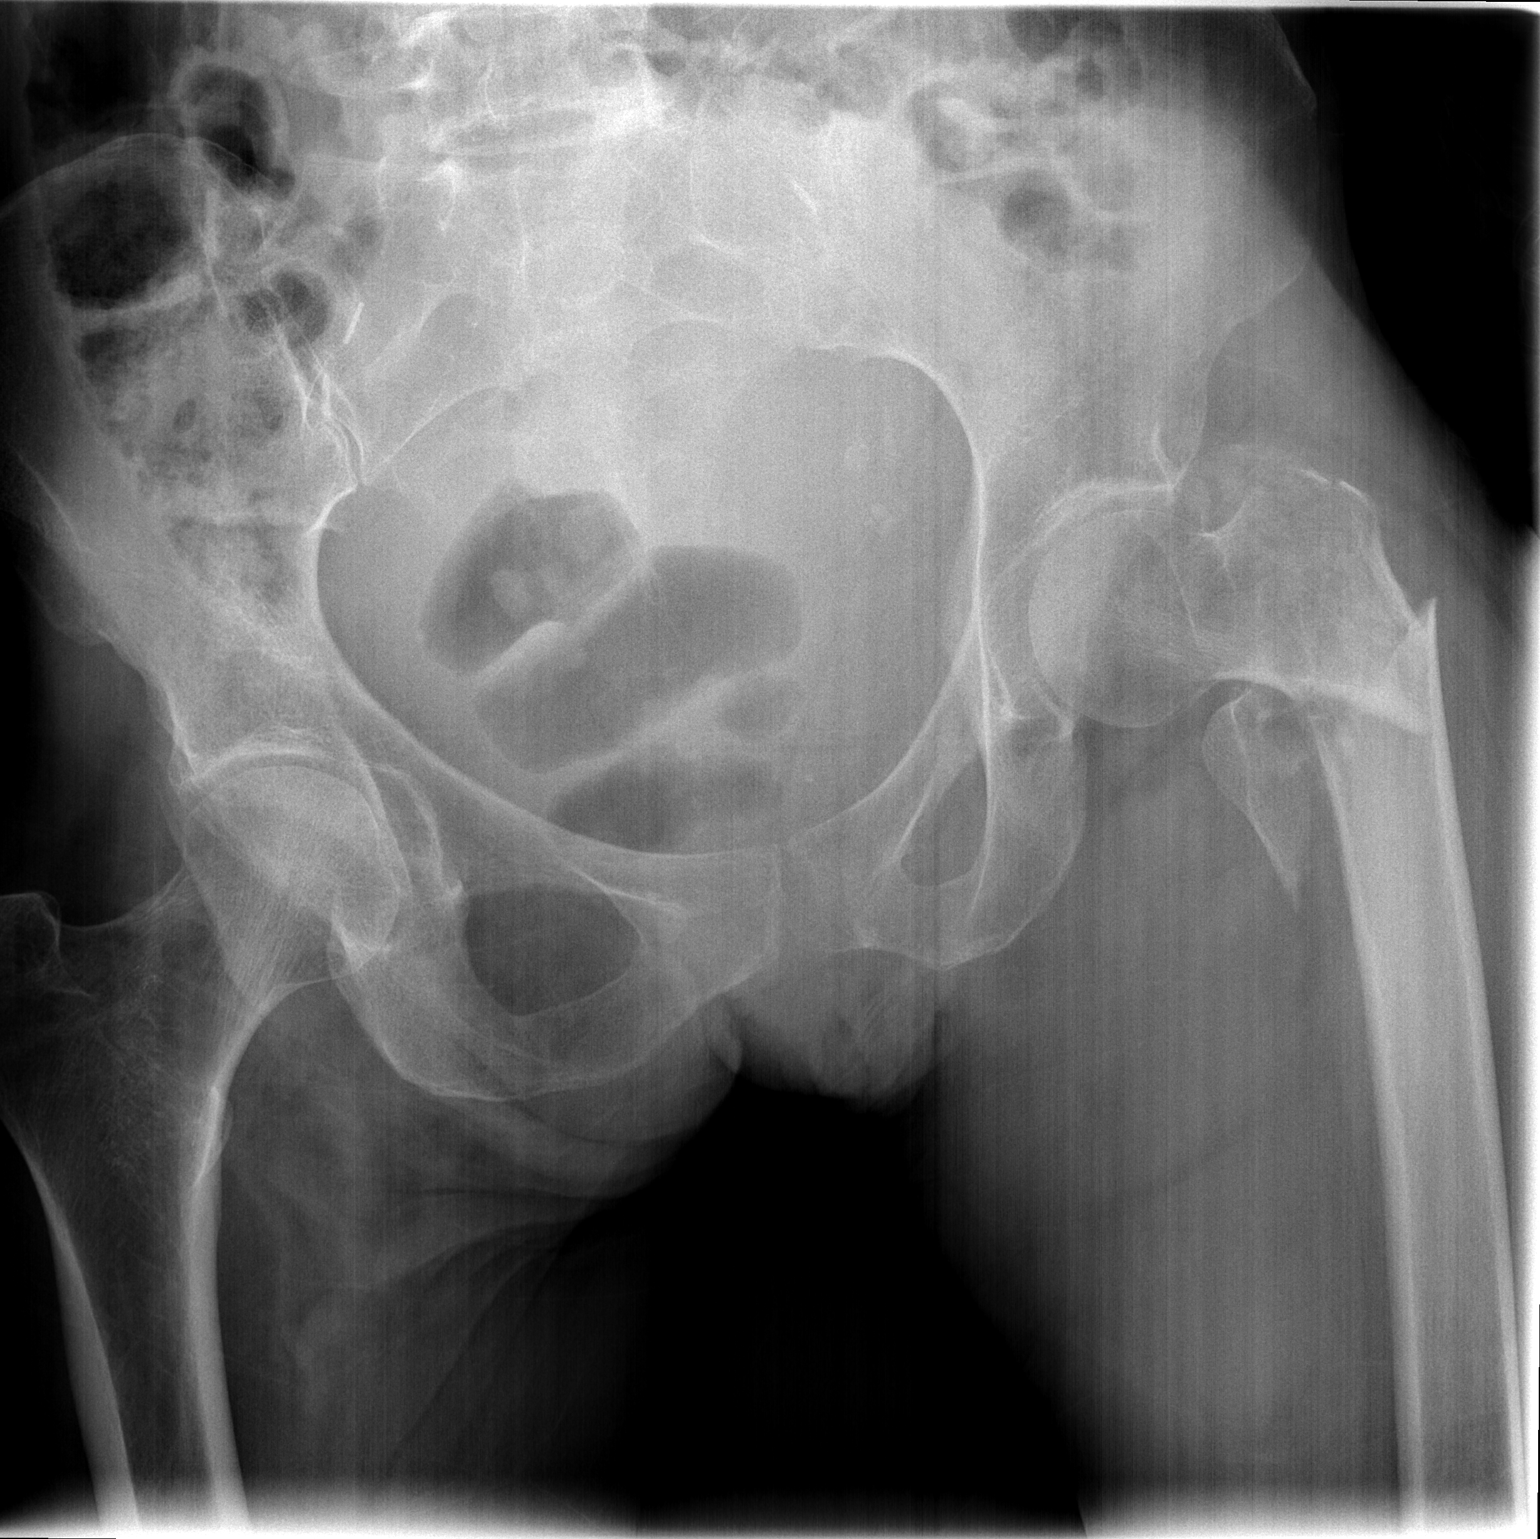

[t hip ap left]
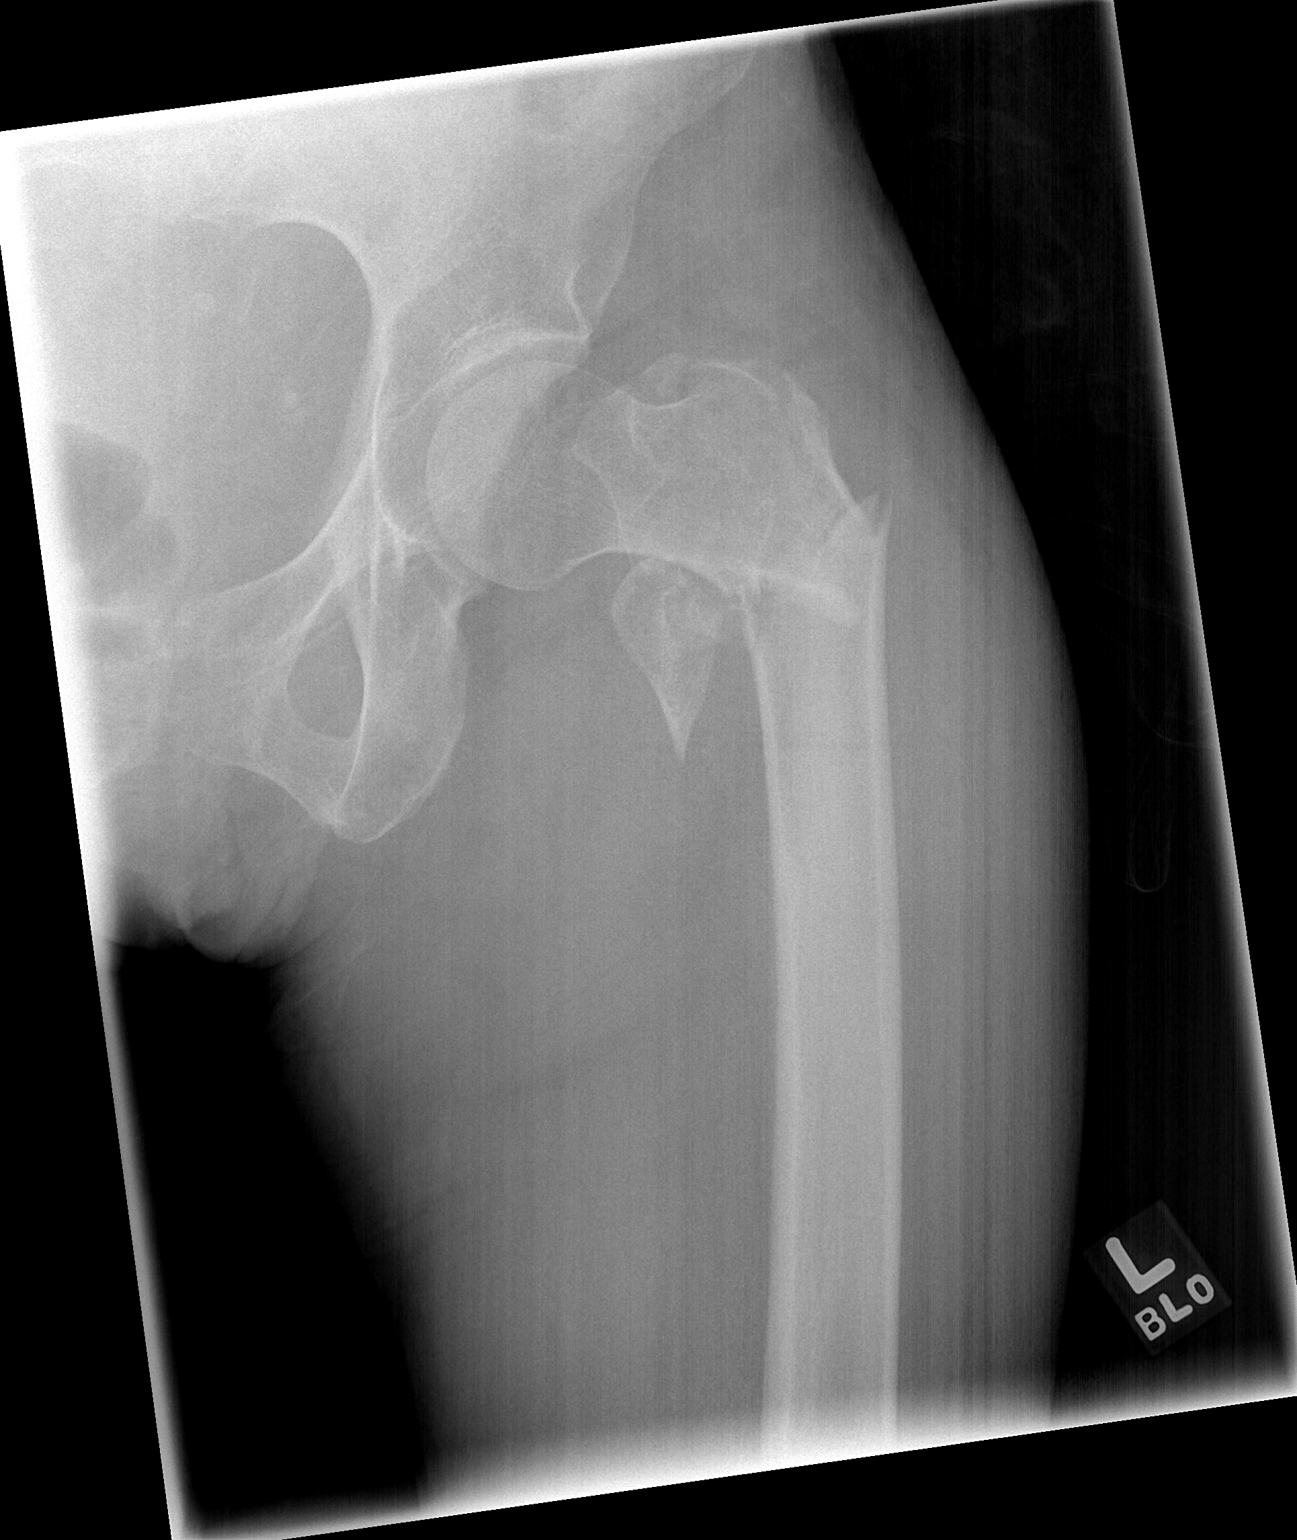

[w hip lateral left]
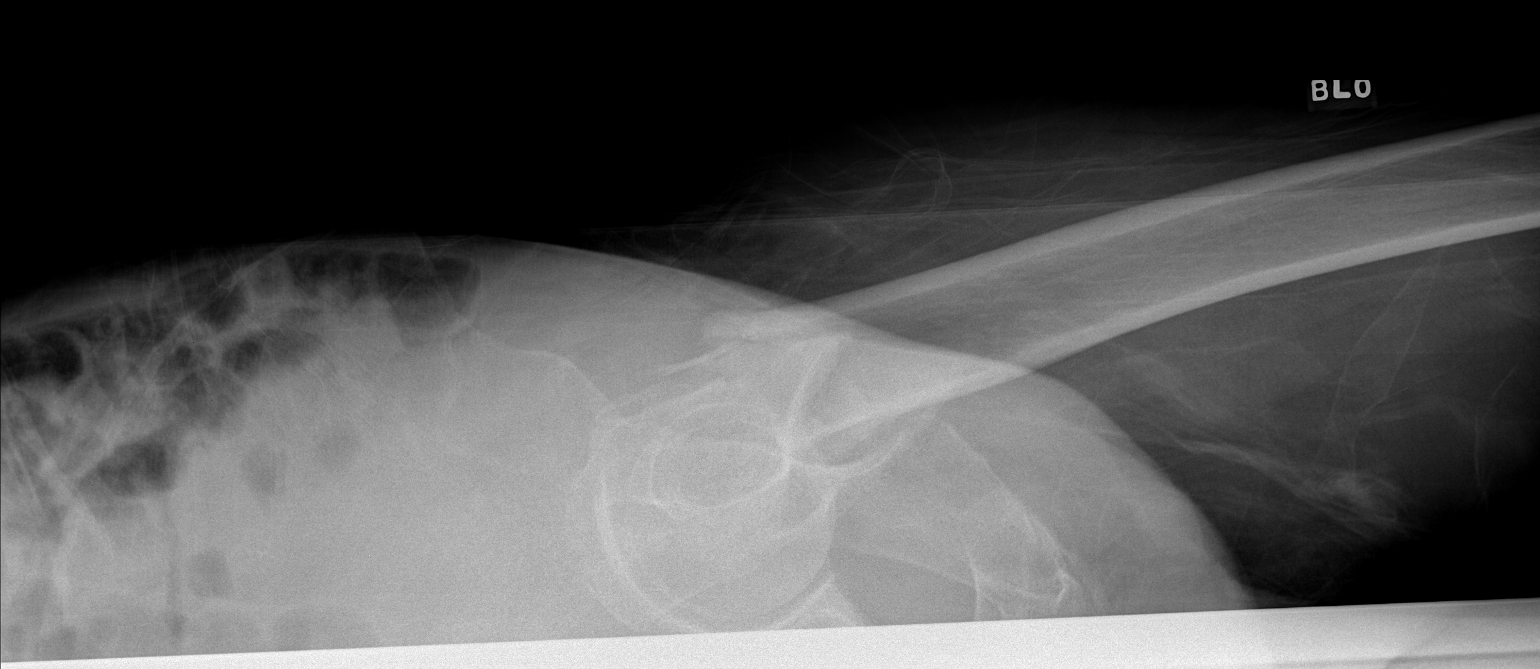

[3 of 3 positions shown; findings below may reference images not displayed]

FINDINGS: Comminuted left intertrochanteric fracture with varus angulation.
One-third shaft width of anterior displacement of the distal fragment. Diffuse
osteopenia.
IMPRESSION: Comminuted left intertrochanteric fracture.

## 2009-02-19 ENCOUNTER — Ambulatory Visit: Payer: Self-pay | Admitting: Cardiology

## 2009-03-11 ENCOUNTER — Emergency Department (HOSPITAL_COMMUNITY): Admission: EM | Admit: 2009-03-11 | Discharge: 2009-03-11 | Payer: Self-pay | Admitting: Emergency Medicine

## 2009-05-25 IMAGING — CR DG ABDOMEN ACUTE W/ 1V CHEST
3 series · 3 of 3 positions shown · non-contrast
Comparison: Chest 07/31/2006.

CLINICAL DATA: Abdominal pain, nausea and vomiting.
 ACUTE ABDOMINAL SERIES ? 2 VIEW, AND CHEST, ONE VIEW:

[w chest pa *]
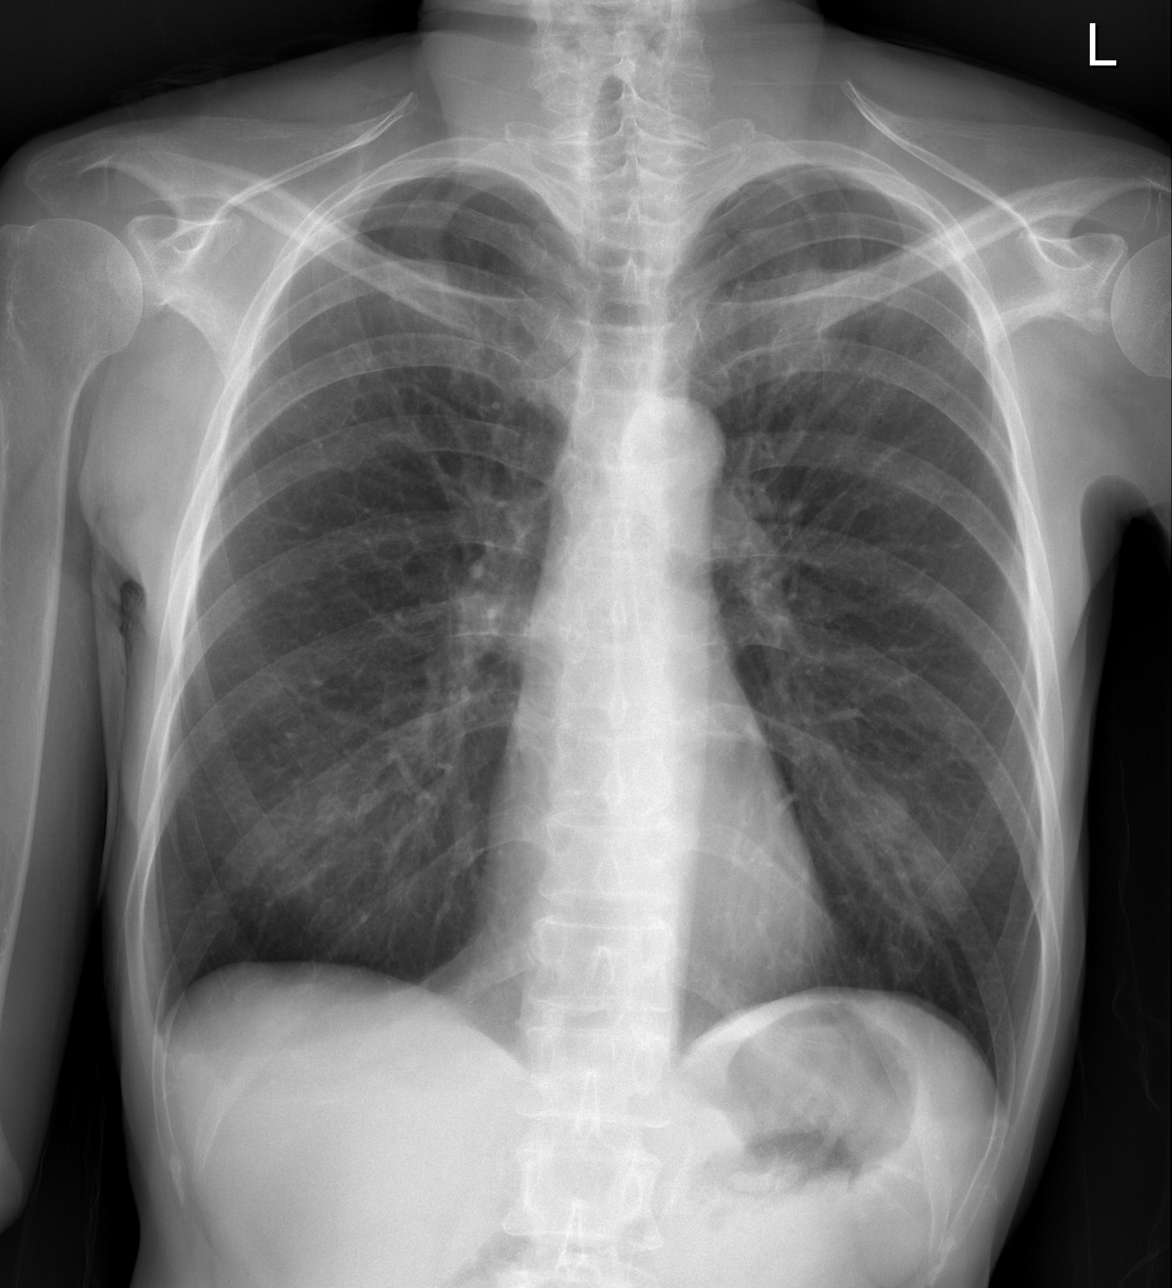

[w abdomen upright *]
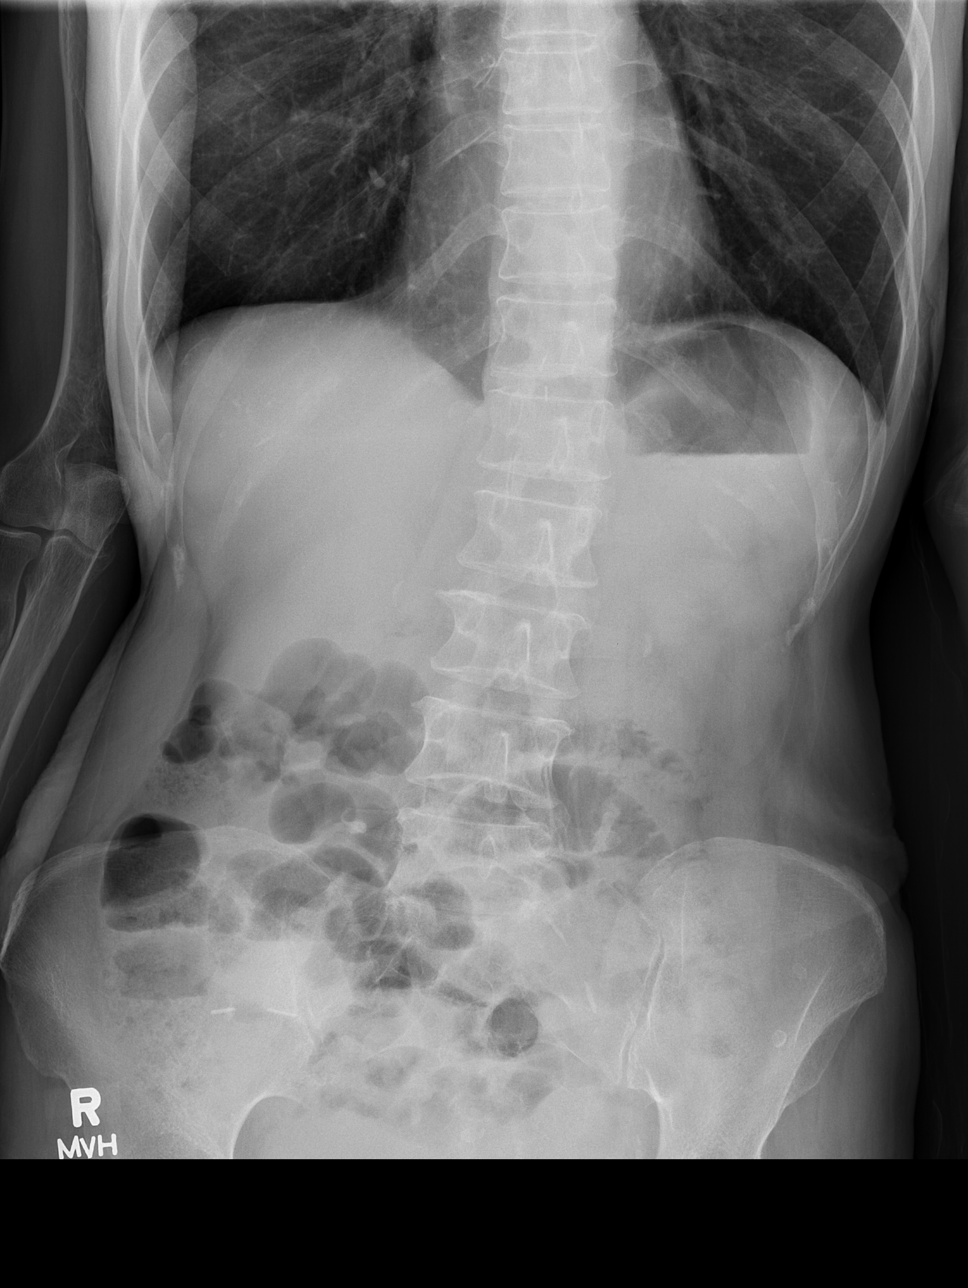

[t abdomen supine]
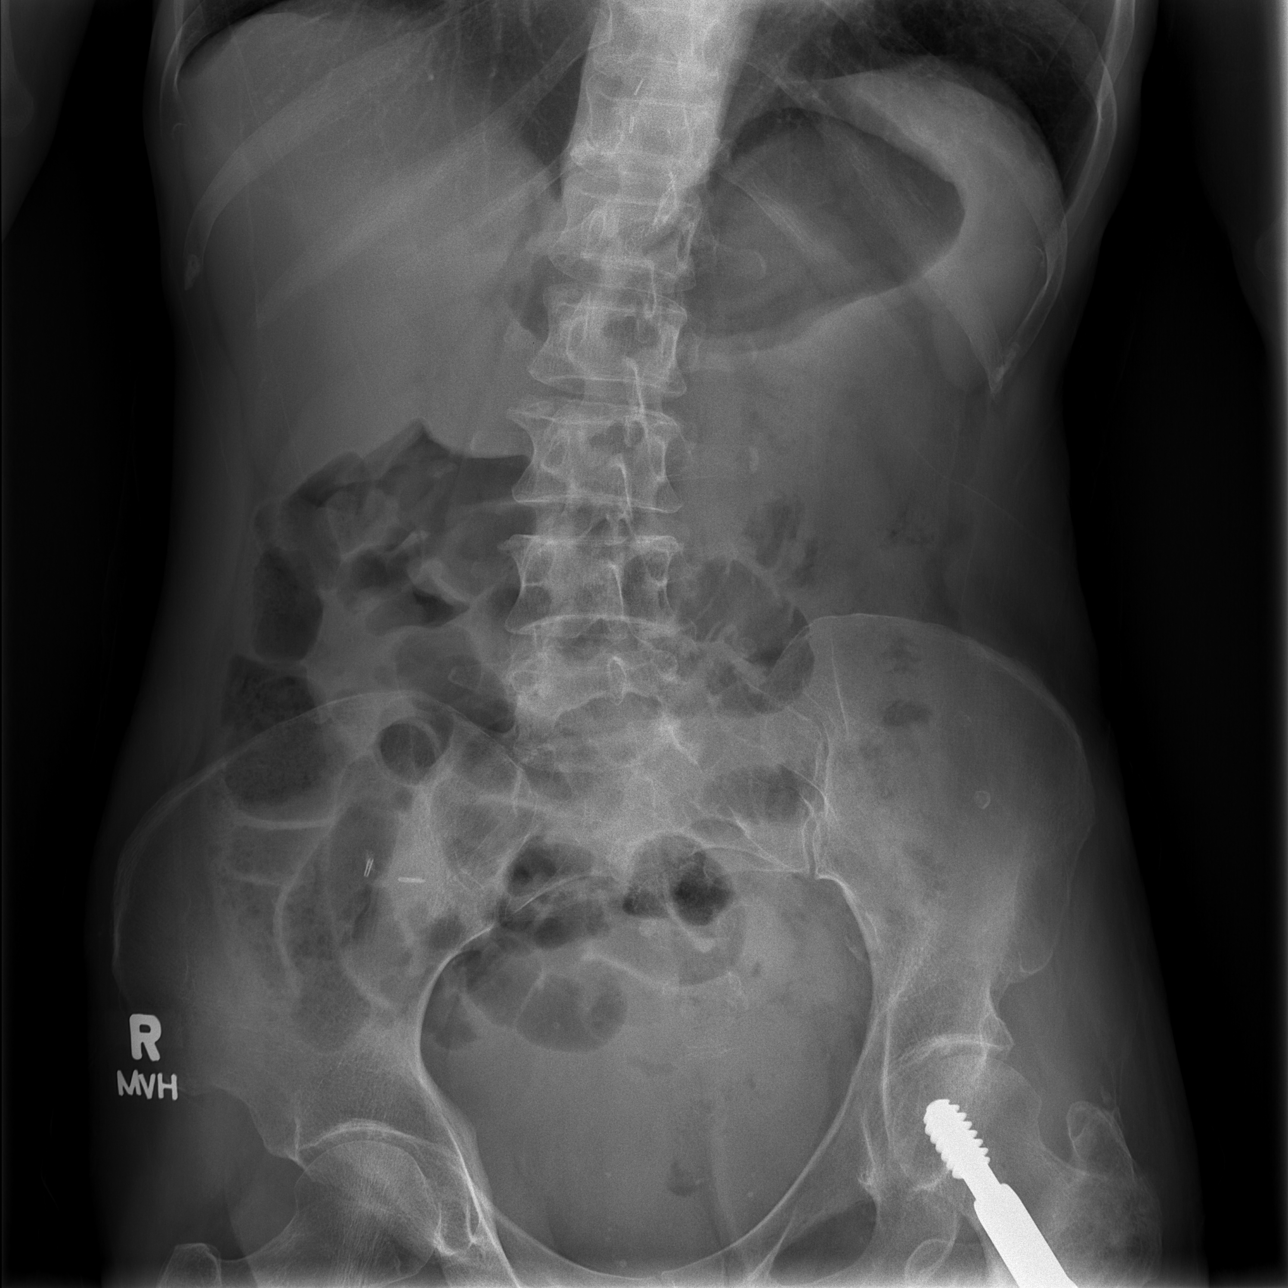

[3 of 3 positions shown; findings below may reference images not displayed]

FINDINGS: There is no evidence of dilated bowel loops or free intraperitoneal air.  No radiopaque calculi or other significant radiographic abnormality is seen.
 Heart size and mediastinal contours are within normal limits.  Both lungs are clear.
FINDINGS: A single view of the chest shows the lungs to be clear but hyperaerated consistent with COPD.  The heart is within normal limits in size.
 Supine and erect views of the abdomen show some small bowel gas without significant distention.  There are a few scattered air-fluid levels.  This may reflect ileus but partial SBO is difficult to exclude and follow-up plain films are recommended if symptoms persist.  No free air is seen.  The bones are somewhat osteopenic.
IMPRESSION: Slightly prominent loops of small bowel with air-fluid levels.  Ileus vs. partial SBO.  Recommend follow-up plain films or CT abdomen and pelvis if indicated clinically.

## 2009-05-25 IMAGING — CT CT ABDOMEN W/ CM
1 of 2 series · 15 of 32 positions shown, 19 images · IV contrast (omnipaque)
Comparison: 05/02/2006

ABDOMEN CT WITH CONTRAST:

CLINICAL DATA: Abdominal pain
TECHNIQUE: Multidetector CT imaging of the abdomen and pelvis was performed
following the standard protocol during bolus administration of intravenous
contrast.

Contrast:  100 cc Omnipaque 300

[Series 2: abd_pel 5.0 b40f st · axial · 0.61mm/px · z∈[-581,-171]mm · 15 of 90 slices shown, 19 images]
[im 4/90  soft-tissue]
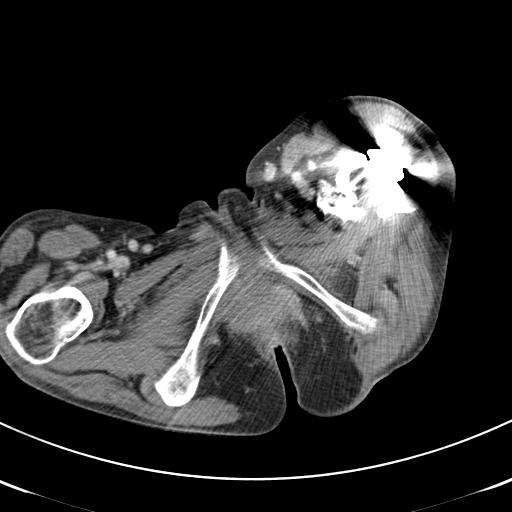
[im 4/90  bone]
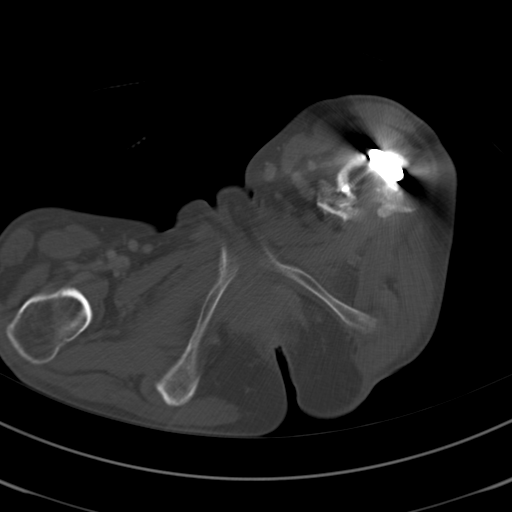
[im 12/90  soft-tissue]
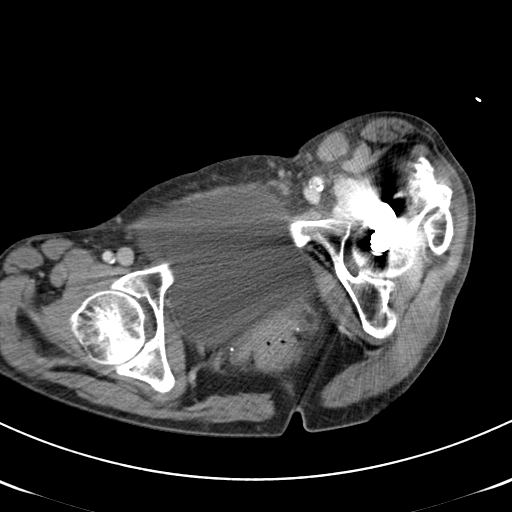
[im 19/90  soft-tissue]
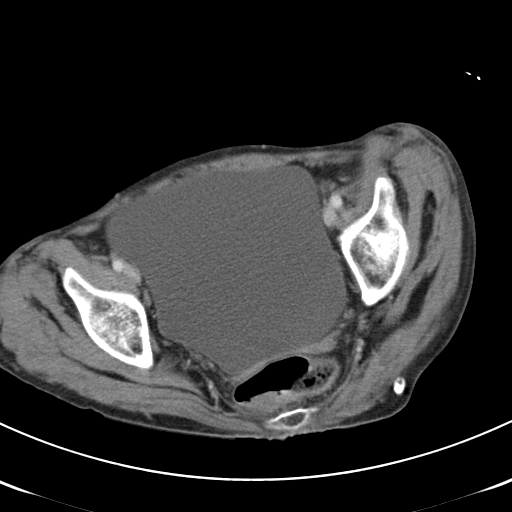
[im 26/90  soft-tissue]
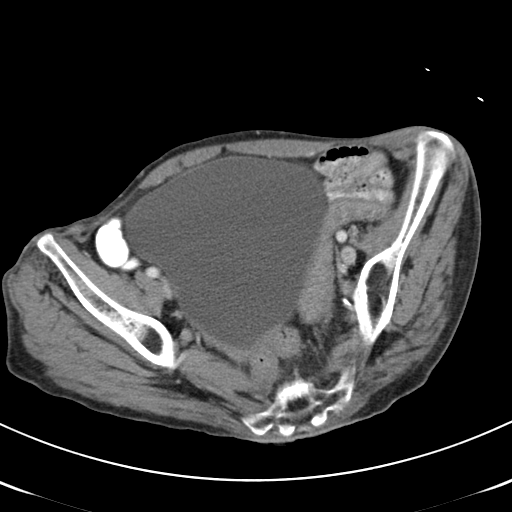
[im 30/90  soft-tissue]
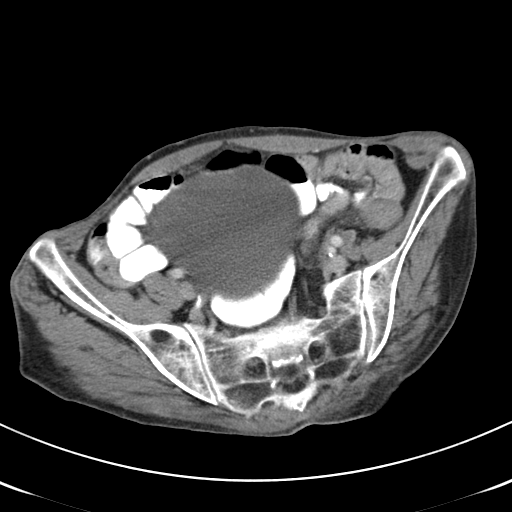
[im 38/90  soft-tissue]
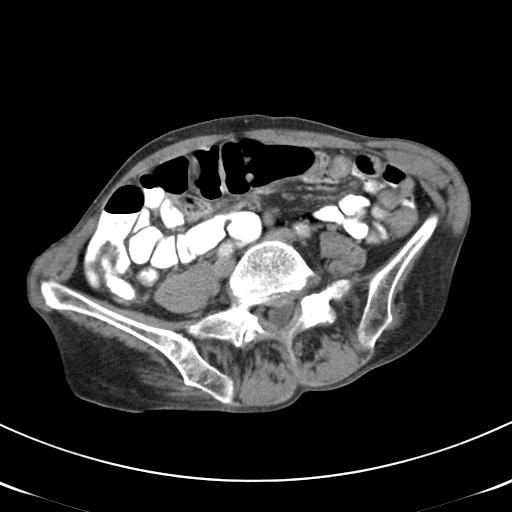
[im 45/90  soft-tissue]
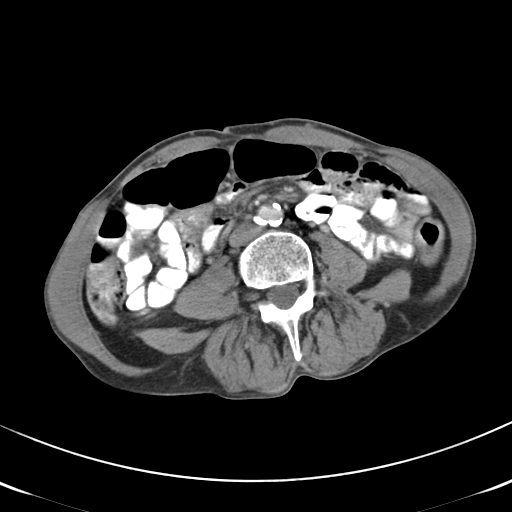
[im 52/90  soft-tissue]
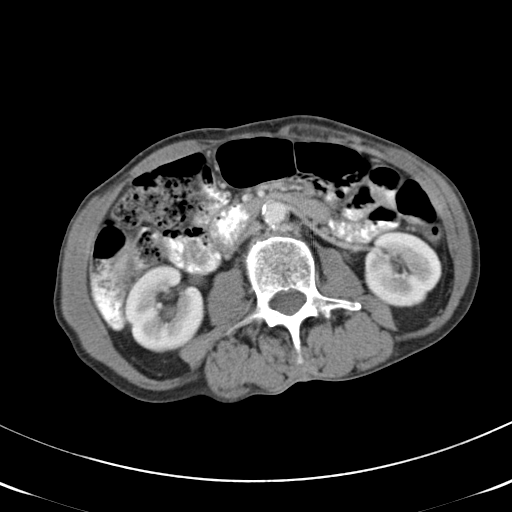
[im 60/90  soft-tissue]
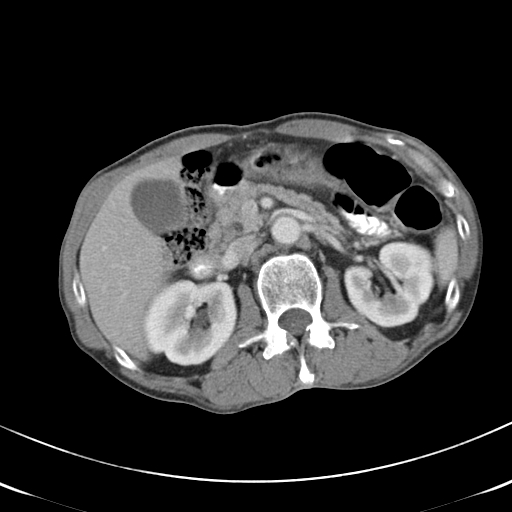
[im 60/90  bone]
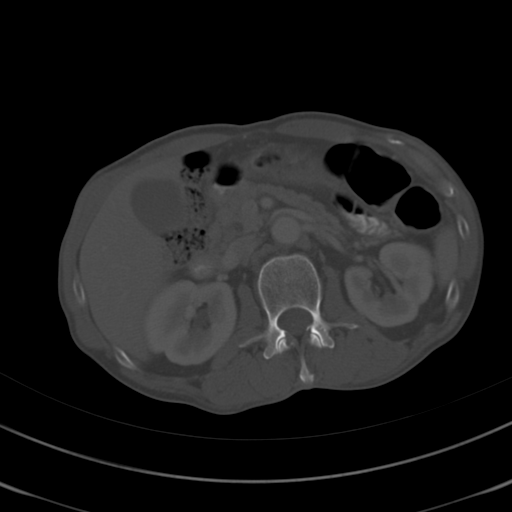
[im 64/90  soft-tissue]
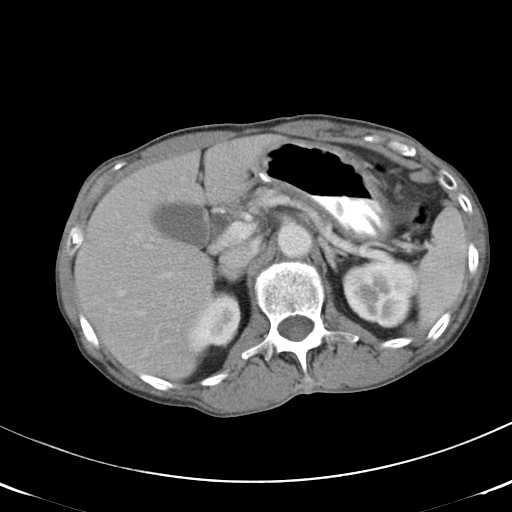
[im 71/90  soft-tissue]
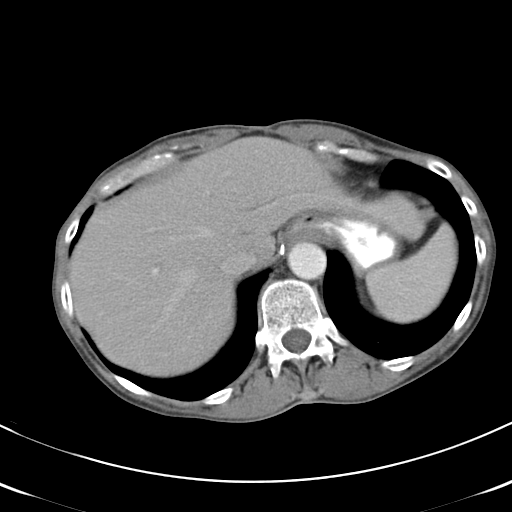
[im 75/90  lung]
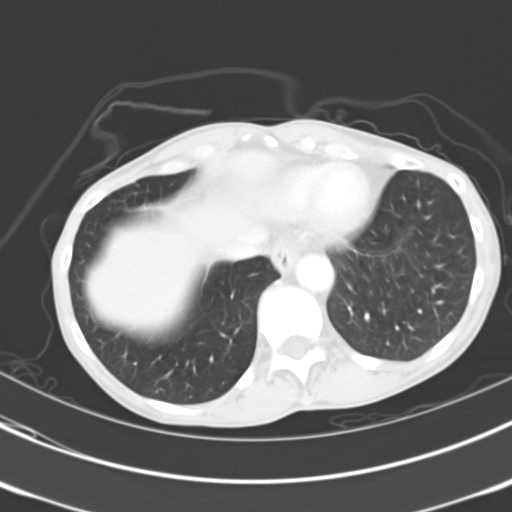
[im 78/90  soft-tissue]
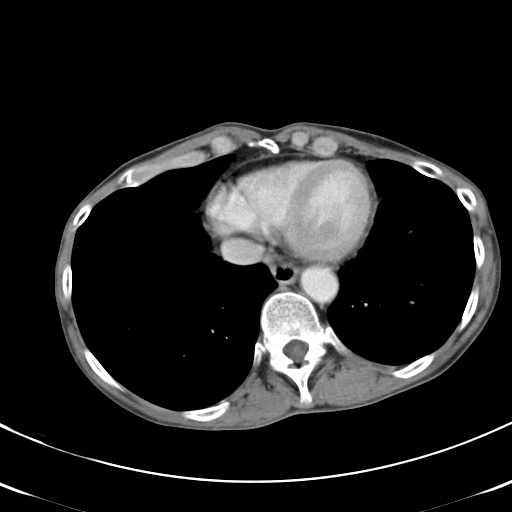
[im 78/90  lung]
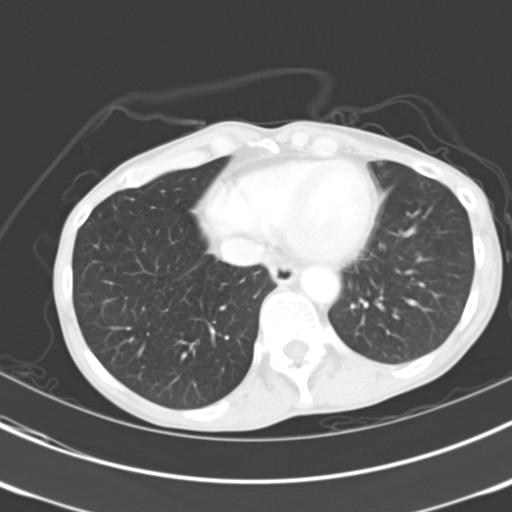
[im 82/90  lung]
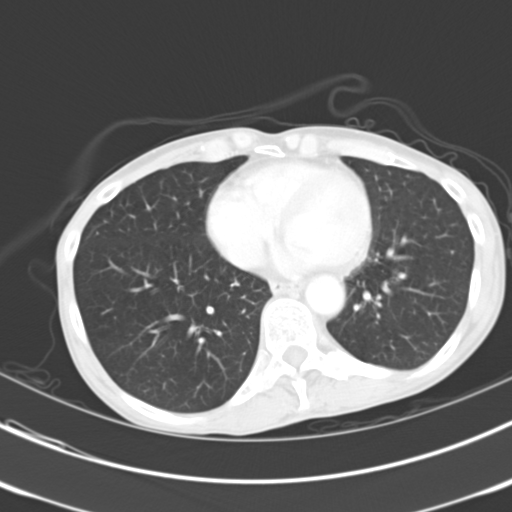
[im 86/90  soft-tissue]
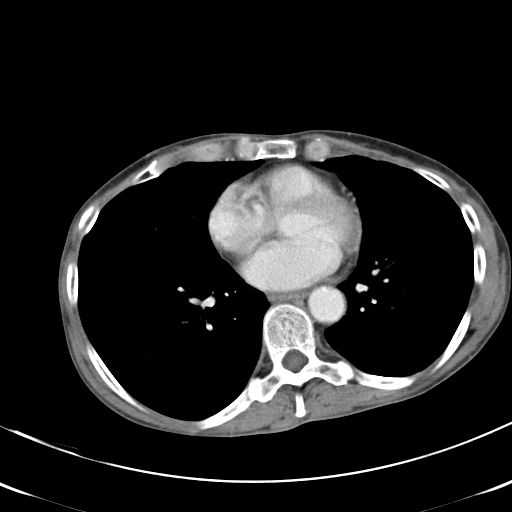
[im 86/90  lung]
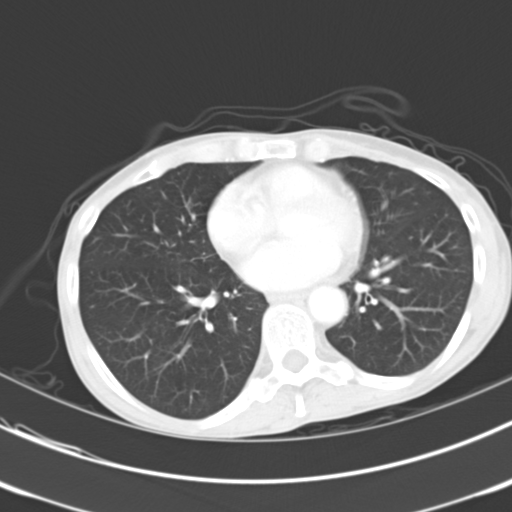

[15 of 32 positions shown; findings below may reference images not displayed]

FINDINGS: No focal abnormality is seen in the liver or spleen. The patient has
a small hiatal hernia. The pancreas is atrophic. The common bile duct is mildly
dilated at 7-8 mm in diameter. The gallbladder is unremarkable. There is trace
intrahepatic biliary duct prominence. No evidence for an adrenal mass. The right
kidney is unremarkable. A 2.5 cm enhancing mass in the lower pole of the left
kidney is highly suspicious for renal cell carcinoma. The left renal vein is
patent. There is no perinephric or left paraaortic lymphadenopathy.

No abdominal aortic aneurysm. There is no intraperitoneal free fluid.
IMPRESSION: 2.5 cm enhancing mass in the lower pole of the left kidney is highly suspicious
for renal cell carcinoma. 

PELVIS CT WITH CONTRAST:
FINDINGS: The bladder is massively dilated. This raises the question of bladder
outlet obstruction or urinary retention.

No pelvic lymphadenopathy. There is no intraperitoneal free fluid. No evidence
for an adnexal mass. The terminal ileum is normal. The appendix is not
visualized.

Bone windows show a dynamic hip screw on the left. Bones are diffusely
demineralized.
IMPRESSION: Marked bladder dilation raises the question of bladder outlet obstruction or
urinary retention. 

I personally called these results to Dr. Yukkam at the time of study
interpretation.

## 2009-06-03 IMAGING — CR DG ABDOMEN ACUTE W/ 1V CHEST
3 series · 3 of 3 positions shown · non-contrast
Comparison: CT abdomen dated 11/10/06.

CLINICAL DATA: Abdominal pain.
 ACUTE ABDOMEN SERIES WITH UPRIGHT CHEST ? 3 VIEW - 11/19/06:

[w chest pa]
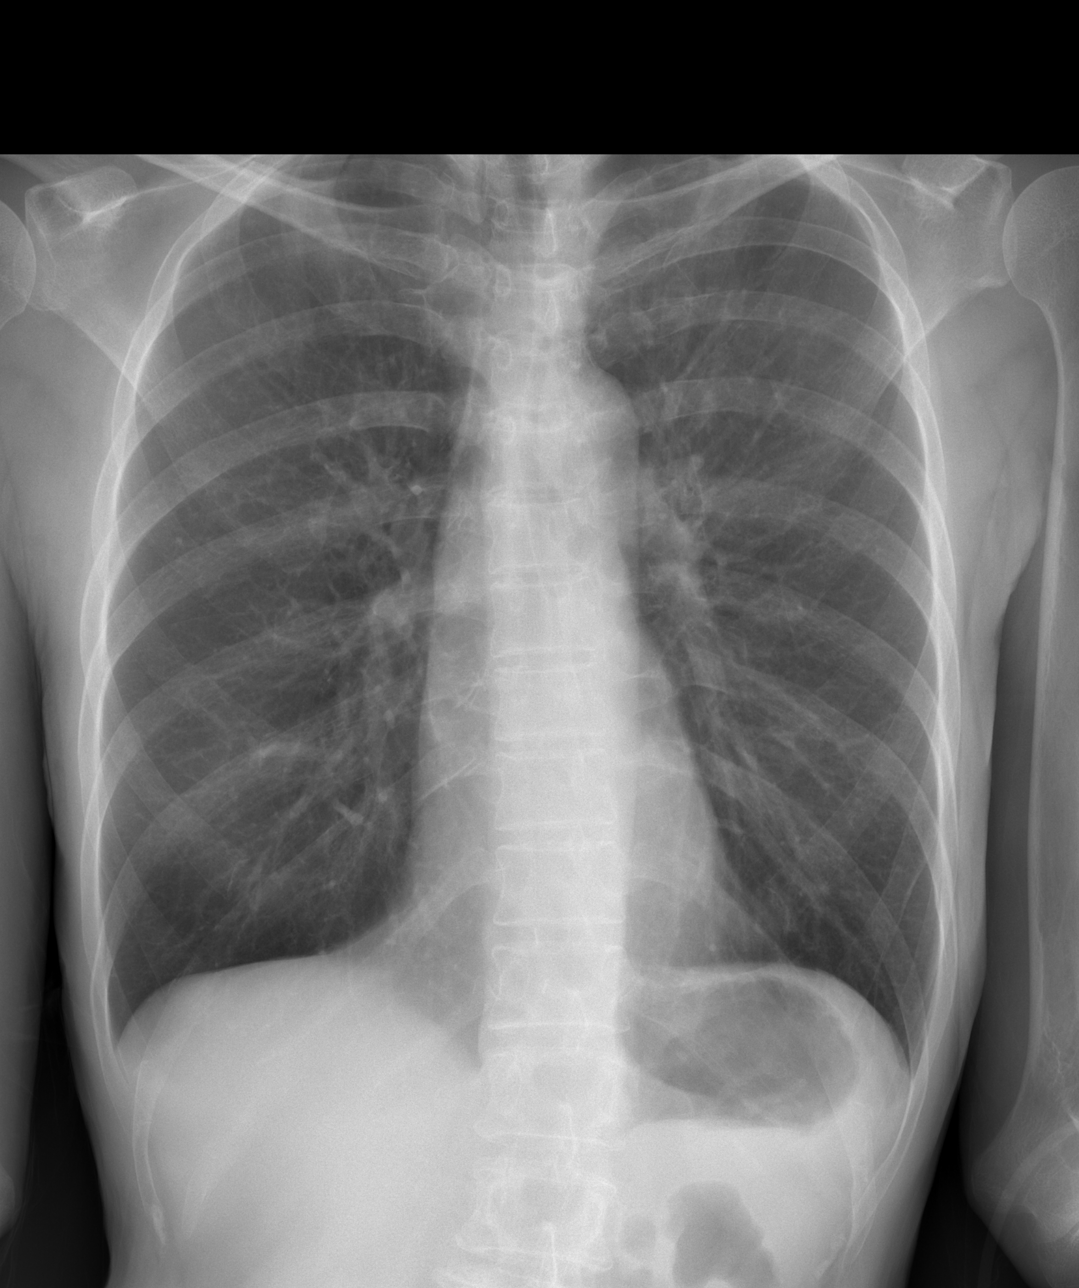

[w abdomen upright *]
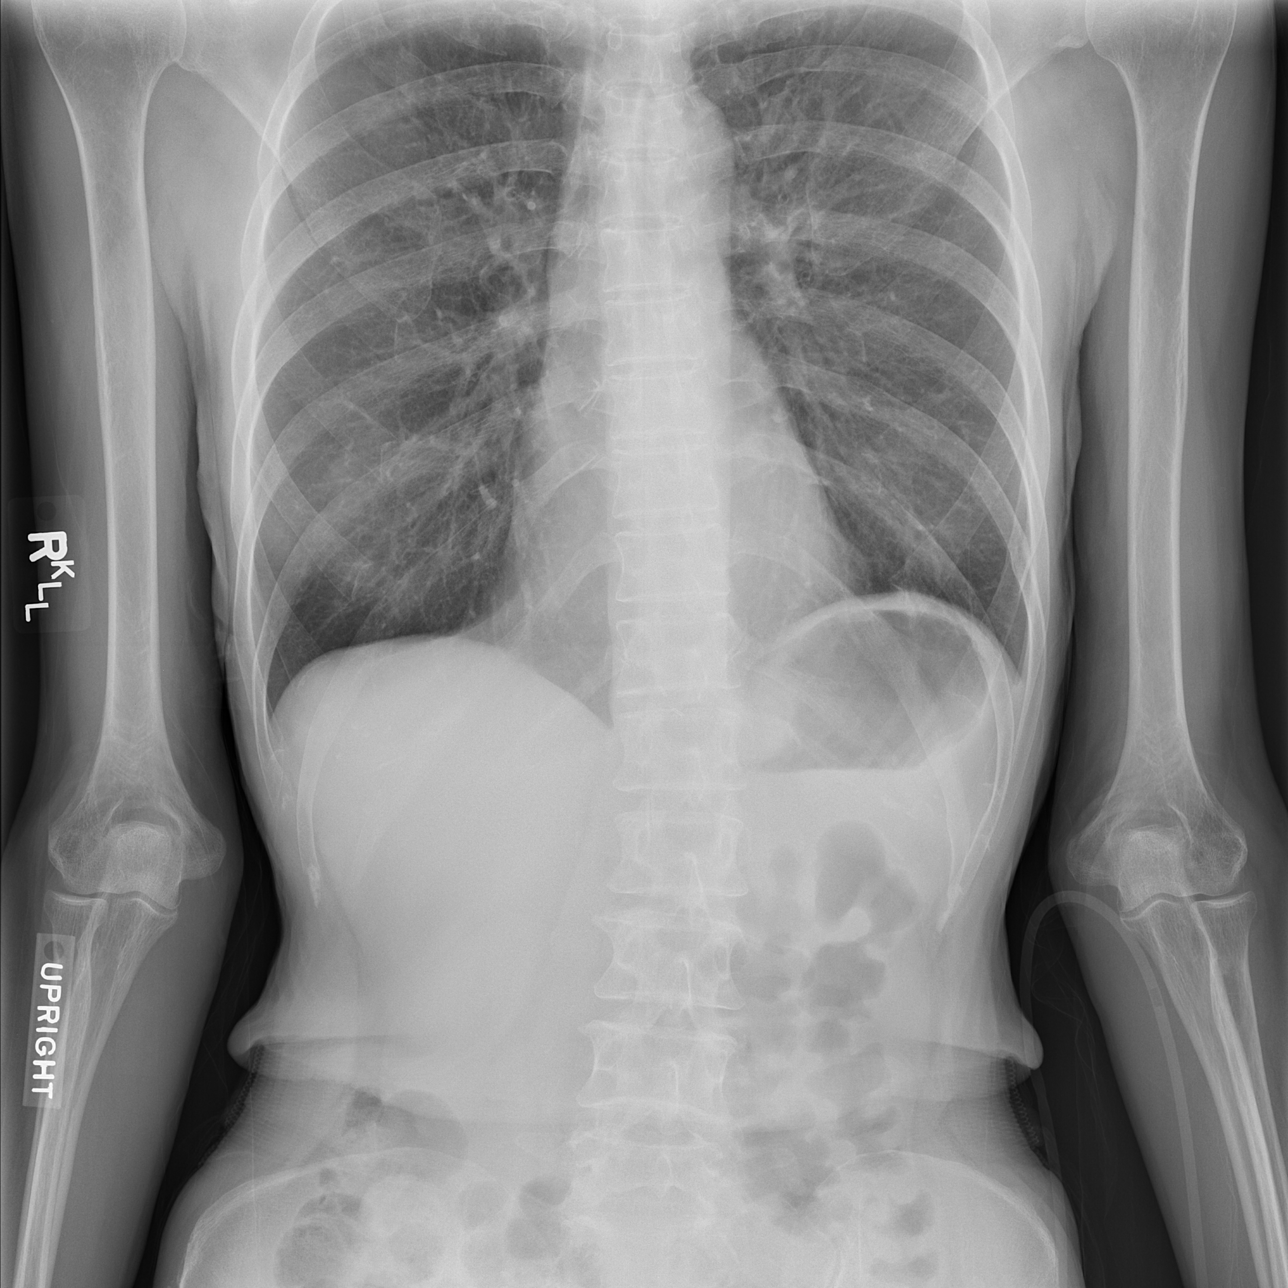

[t abdomen supine]
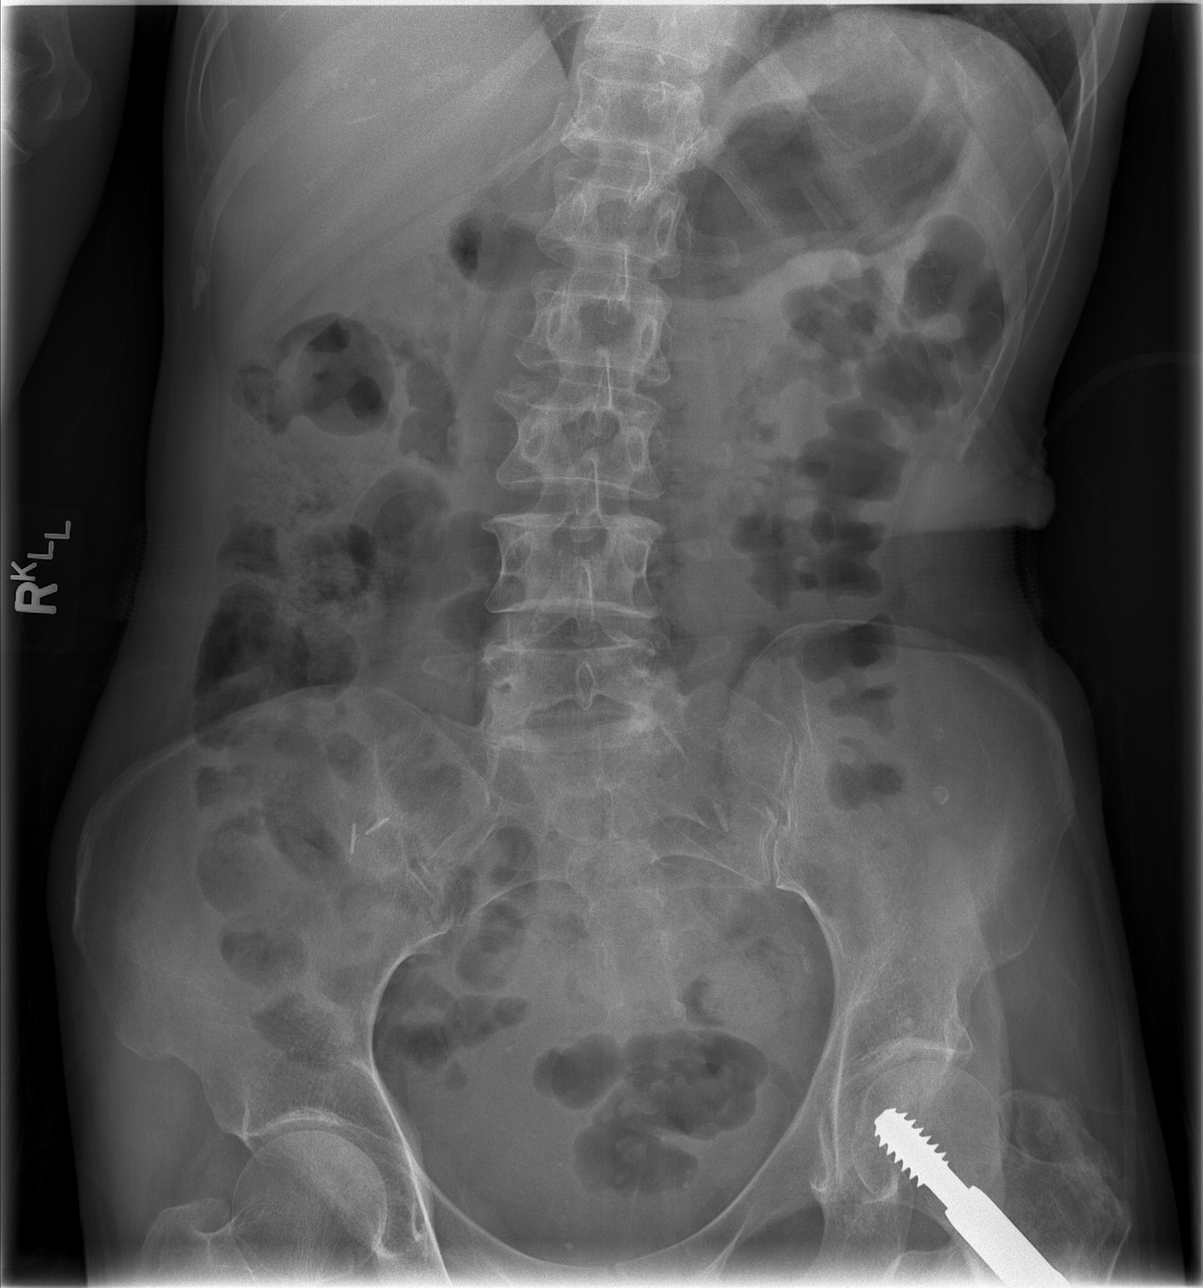

[3 of 3 positions shown; findings below may reference images not displayed]

The lungs are clear.  No evidence of bowel obstruction.  No free air is identified.  No visualized abnormal calcifications.
IMPRESSION: No acute findings.

## 2009-06-10 ENCOUNTER — Emergency Department (HOSPITAL_COMMUNITY): Admission: EM | Admit: 2009-06-10 | Discharge: 2009-06-10 | Payer: Self-pay | Admitting: Emergency Medicine

## 2009-06-24 ENCOUNTER — Emergency Department (HOSPITAL_COMMUNITY): Admission: EM | Admit: 2009-06-24 | Discharge: 2009-06-24 | Payer: Self-pay | Admitting: Emergency Medicine

## 2009-07-16 ENCOUNTER — Emergency Department (HOSPITAL_COMMUNITY): Admission: EM | Admit: 2009-07-16 | Discharge: 2009-07-16 | Payer: Self-pay | Admitting: Emergency Medicine

## 2009-07-31 IMAGING — CR DG CHEST 2V
2 series · 2 of 2 positions shown · non-contrast
Comparison: none

CLINICAL DATA: Abdominal pain.  Renal cell carcinoma.
CHEST ? 2 VIEW:

[w chest pa]
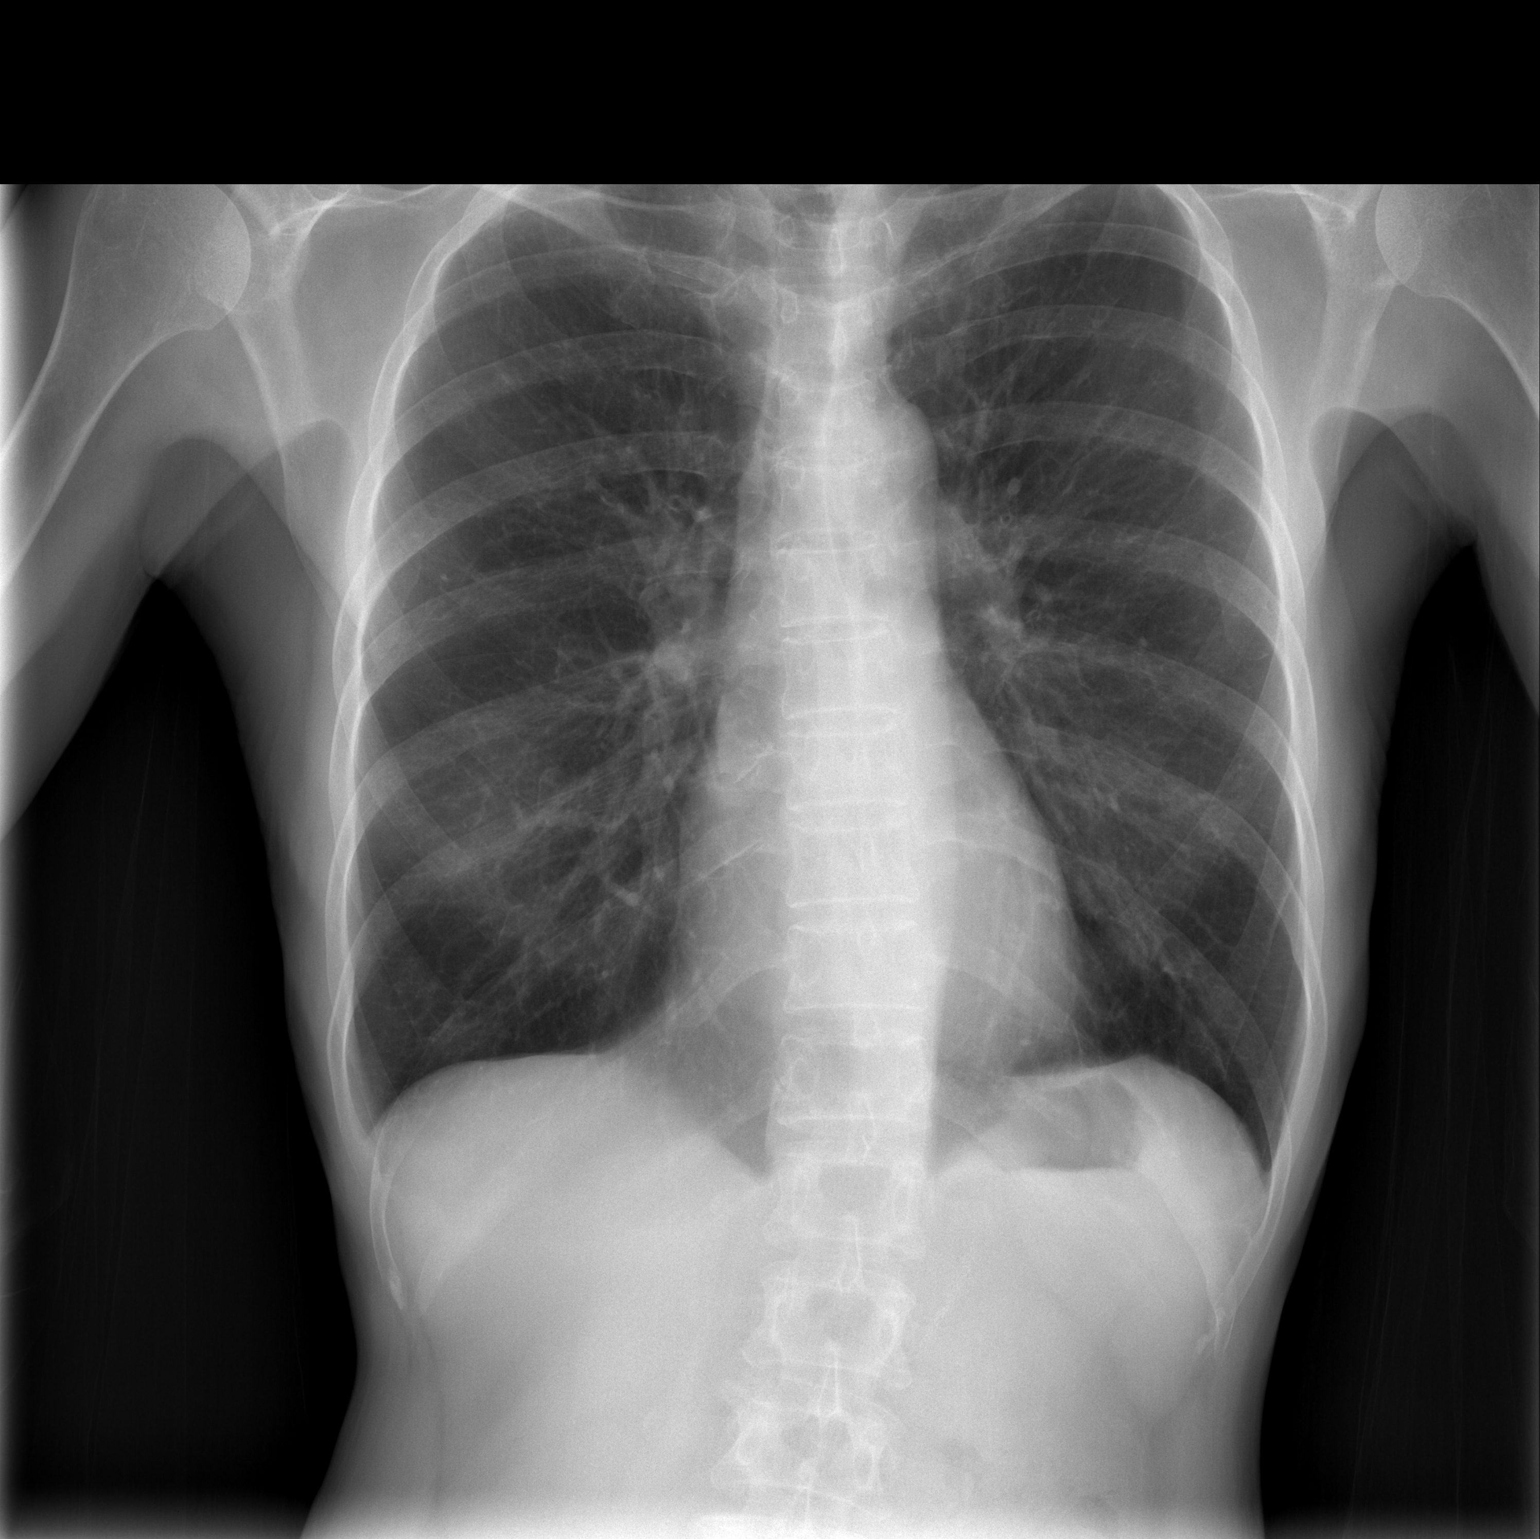

[w chest lat]
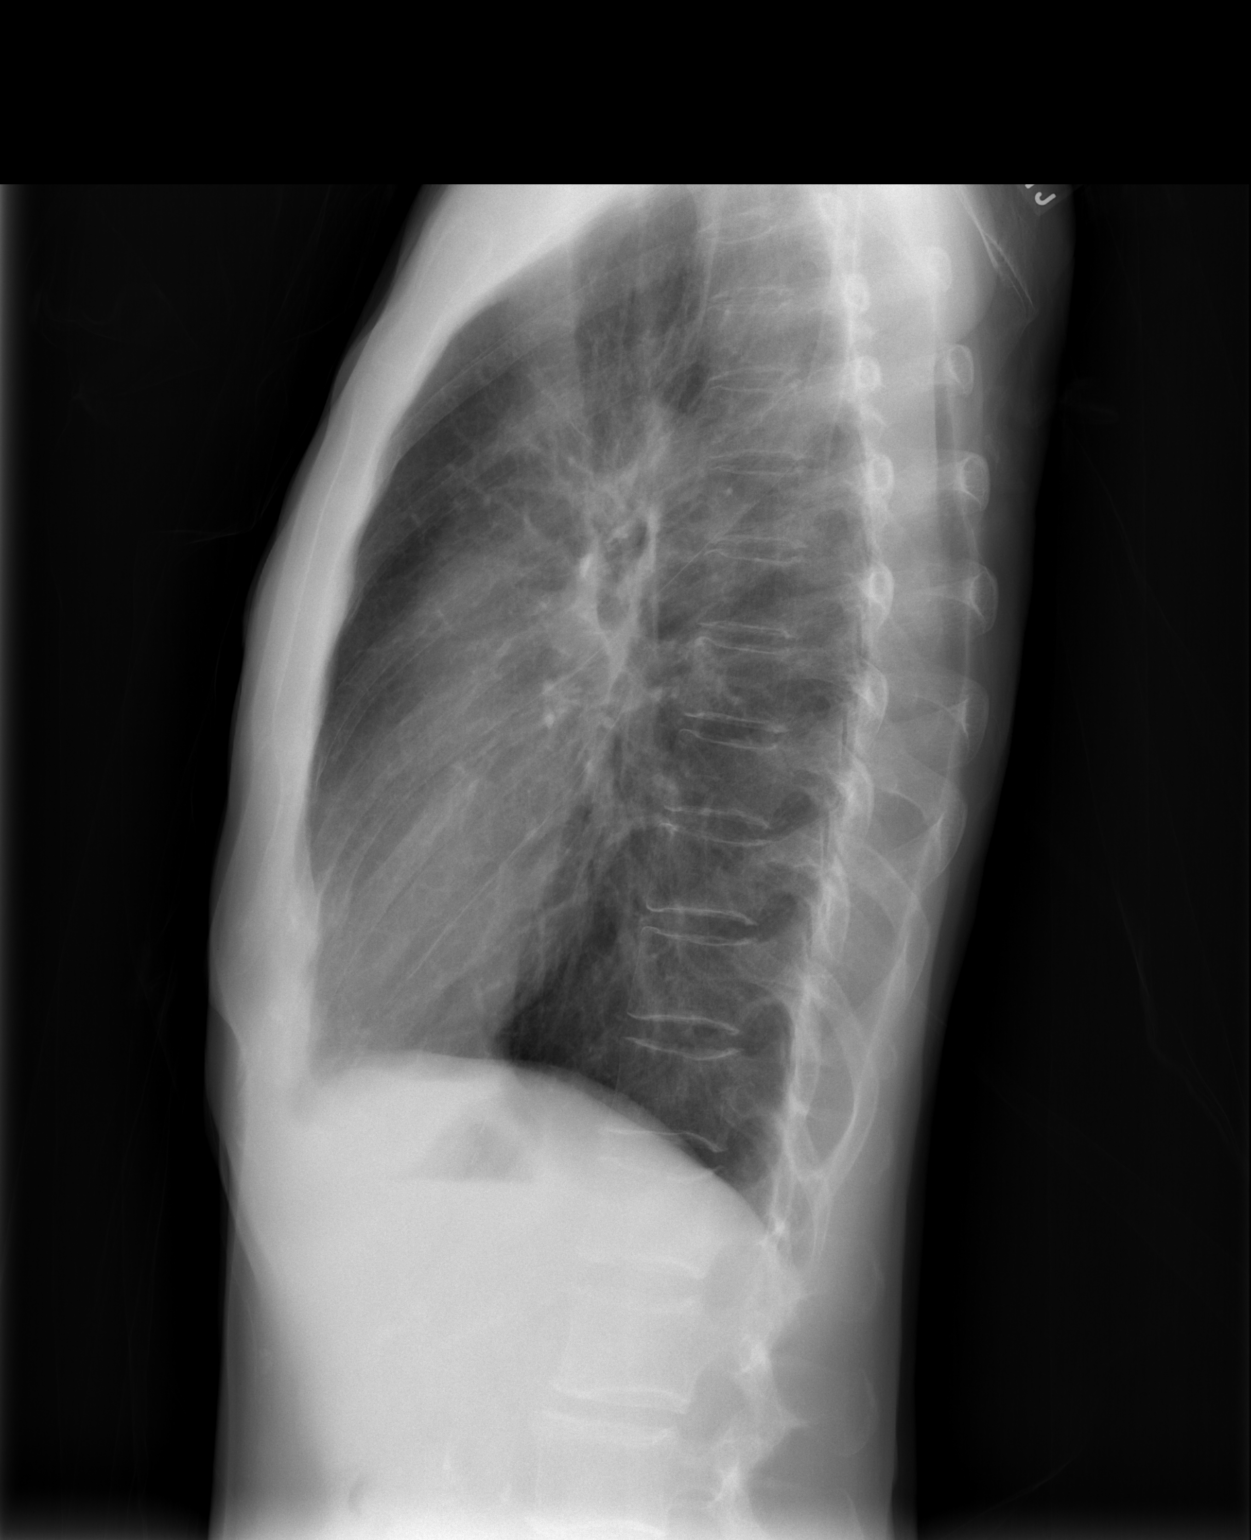

[2 of 2 positions shown; findings below may reference images not displayed]

FINDINGS: The heart size and mediastinal contours are within normal limits.  Both lungs are clear.  The visualized skeletal structures are unremarkable.
IMPRESSION: No active cardiopulmonary disease.

## 2009-07-31 IMAGING — NM NM HEPATO W/GB/PHARM/[PERSON_NAME]
2 series · 12 of 12 positions shown · non-contrast
Comparison: none

CLINICAL DATA: Abdominal pain.    
 NUCLEAR MEDICINE HEPATOBILIARY SCAN WITH EJECTION FRACTION:
TECHNIQUE: Sequential abdominal images were obtained following intravenous injection of radiopharmaceutical.  Sequential images were continued following oral ingestion of 8 oz. half-and-half, and the gallbladder ejection fraction was calculated.
 Radiopharmaceutical:  5.5 mCi 9c-HHm Choletec given intravenously.

[Series 1: he hepato · 4.71mm/px · 6 of 60 frames shown (1 of 2)]
[frame 6/60]
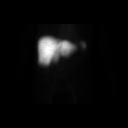
[frame 16/60]
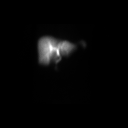
[frame 26/60]
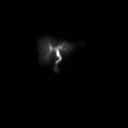
[frame 36/60]
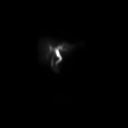
[frame 46/60]
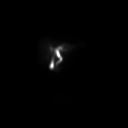
[frame 56/60]
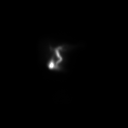

[Series 1: he hepato · 4.71mm/px · 6 of 60 frames shown (2 of 2)]
[frame 6/60]
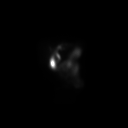
[frame 16/60]
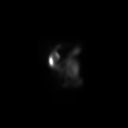
[frame 26/60]
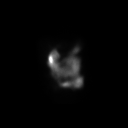
[frame 36/60]
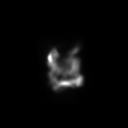
[frame 46/60]
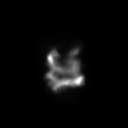
[frame 56/60]
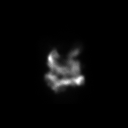

[12 of 12 positions shown; findings below may reference images not displayed]

FINDINGS: There is uniform uptake in the liver.  Gallbladder is seen at 45 minutes with bowel activity 5 minutes after 8 oz. half-and-half p.o.  Gallbladder ejection fraction is 69% (normal is greater than 50%).
IMPRESSION: No acute findings.  Normal gallbladder ejection fraction.

## 2009-08-02 IMAGING — CT CT GUIDANCE TISSUE ABLATION
1 of 2 series · 13 of 27 positions shown, 17 images · non-contrast
Comparison: none

CLINICAL DATA: 2.5 cm solid and enhancing tumor of the lower left kidney.  The patient has been referred for percutaneous ablation and biopsy with previous consultation performed on 12/13/06.  She was admitted on 01/15/07 with acute exacerbation of chronic pancreatitis.  The patient has recovered adequately for treatment of the left renal tumor, which was initially scheduled as an outpatient procedure for 01/18/07.
 CT GUIDED CORE BIOPSY OF RIGHT RENAL TUMOR:
 CT GUIDED PERCUTANEOUS RADIOFREQUENCY ABLATION OF RIGHT RENAL TUMOR:
 Consent:  Prior to the procedure, informed consent was obtained.  The patient received 1 gram of IV Ancef. 
 Anesthesia:  General.
 Procedure:  Initial imaging was performed after the patient was placed under anesthesia and placed into a prone position.  After manipulating body position, ultimately a site was marked along the posterior left flank region and sterilely prepped and draped.
 A 22-gauge spinal needle was advanced as a localizing needle to the level of the mass.  Initially, a 10 cm long, 3 cm ablation zone Cool Tip ablation probe was advanced into the superior aspect of the renal tumor.  It was then attempted to place two separate tandem 10 cm x 3 cm probes into the inferior aspect of the tumor.  Ultimately, one additional probe was positioned in the inferior aspect of the tumor for a total of two ablation probes placed in the tumor itself.
 A 17-gauge trocar needle was then advanced under CT guidance into the tumor.  Core biopsy was performed with an automated 18-gauge device.  Three core samples were submitted in Formalin.  The outer needle was then removed. 
 Radiofrequency ablation was performed of the renal tumor with an initial 16-minute cycle between the two ablation probes utilizing the [REDACTED] generator.  After the initial cycle of ablation, tissue temperatures were checked through the probes, as they were retracted.  Inferior ablation probe was removed.  The superior ablation probe was slightly repositioned.  Additional cycle of 12 minutes of radiofrequency ablation was then performed through the single remaining probe.  Tissue temperatures were then checked through this probe, as it was retracted.  After probe removal, additional CT images were performed.

[Series 23: post ablations 5.0 b40f st · axial · 0.40mm/px · z∈[-406,-301]mm · 13 of 25 slices shown, 17 images]
[im 3/25  soft-tissue]
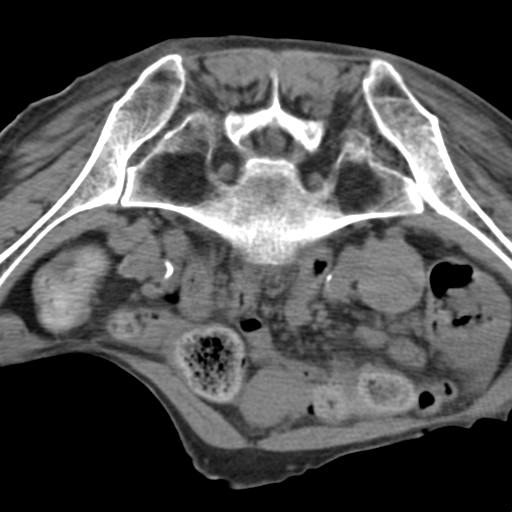
[im 3/25  bone]
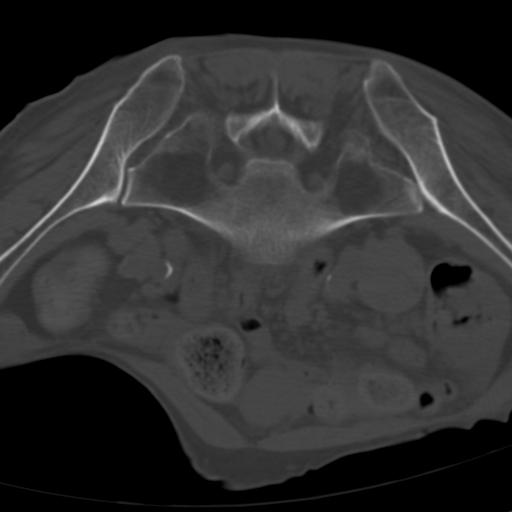
[im 5/25  soft-tissue]
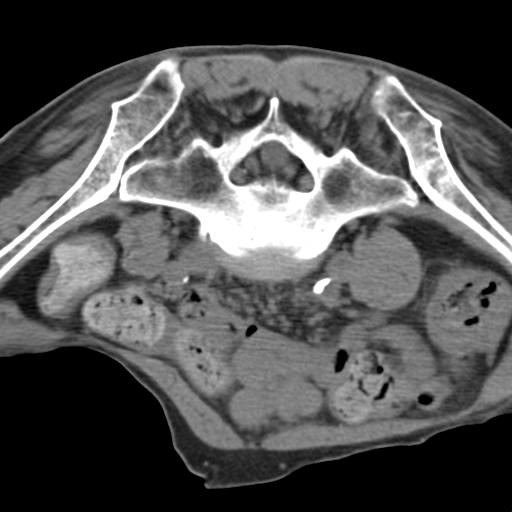
[im 7/25  soft-tissue]
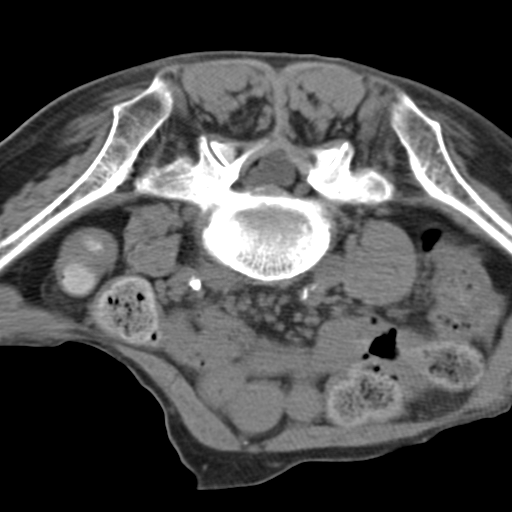
[im 9/25  soft-tissue]
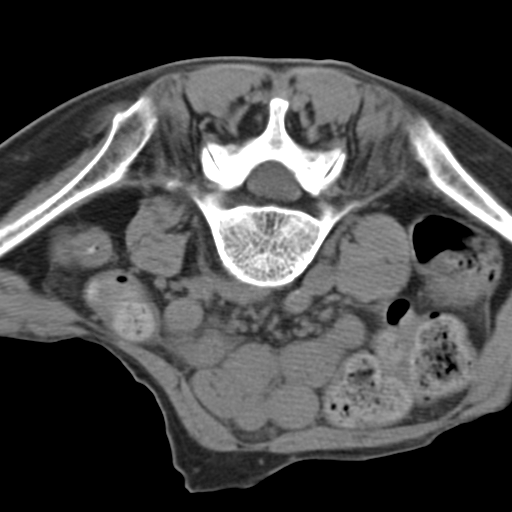
[im 11/25  soft-tissue]
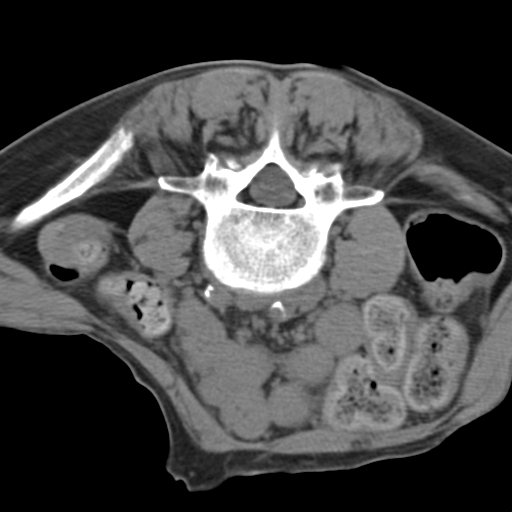
[im 13/25  soft-tissue]
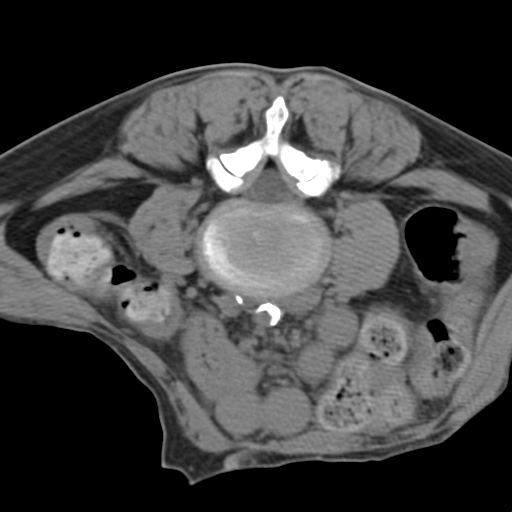
[im 15/25  soft-tissue]
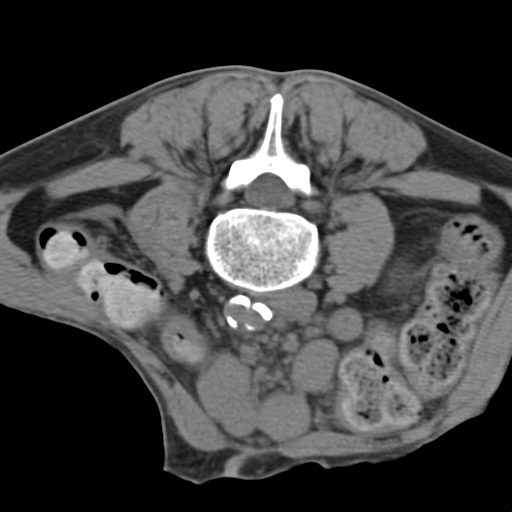
[im 17/25  soft-tissue]
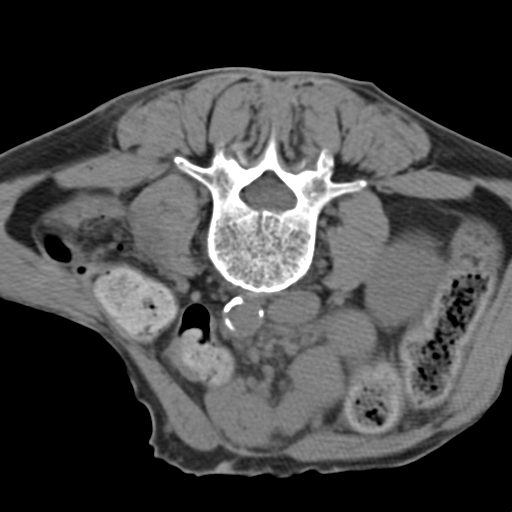
[im 19/25  soft-tissue]
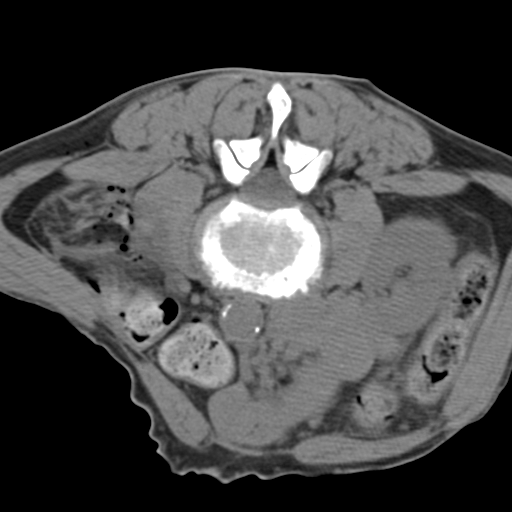
[im 19/25  bone]
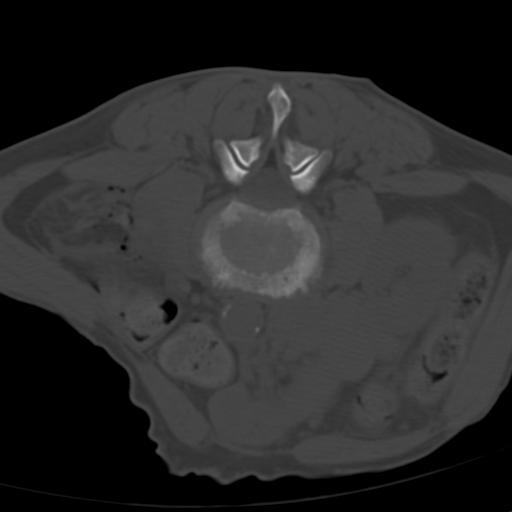
[im 21/25  soft-tissue]
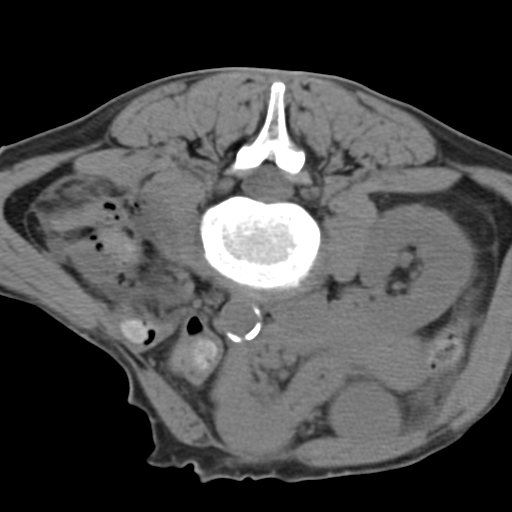
[im 21/25  lung]
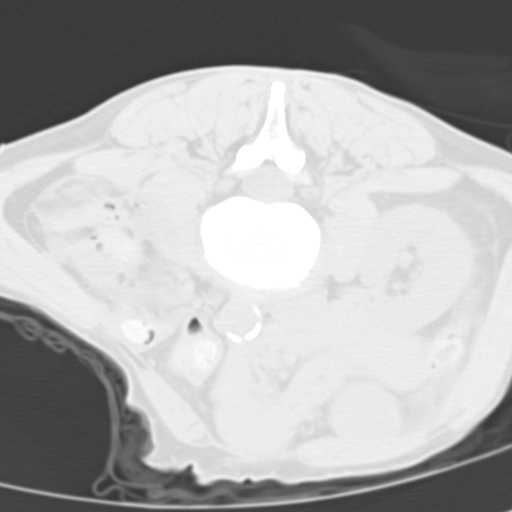
[im 22/25  lung]
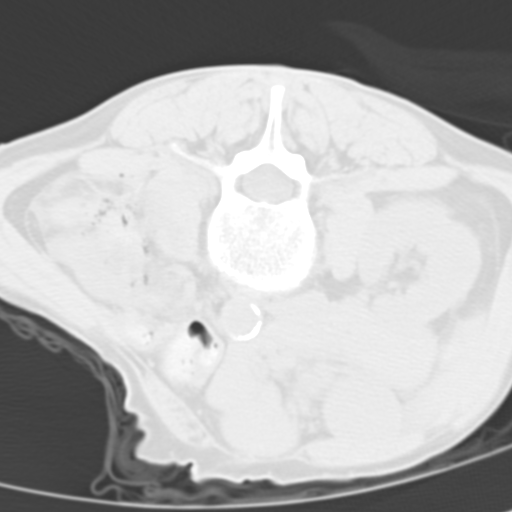
[im 23/25  soft-tissue]
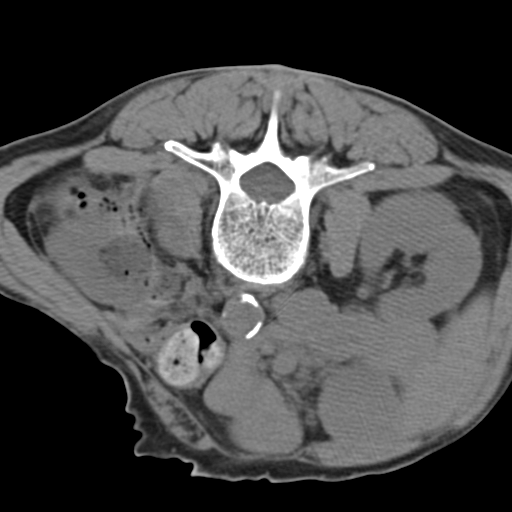
[im 23/25  lung]
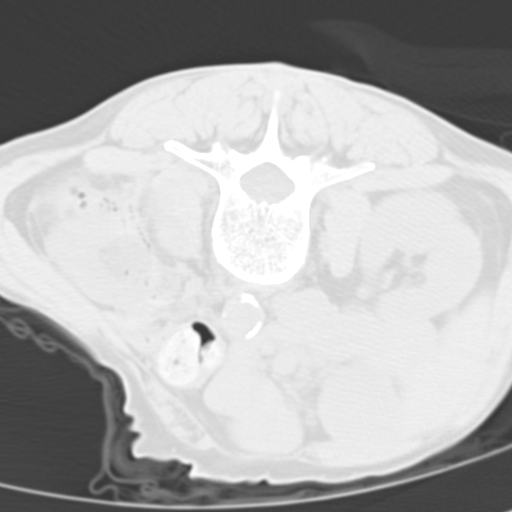
[im 24/25  lung]
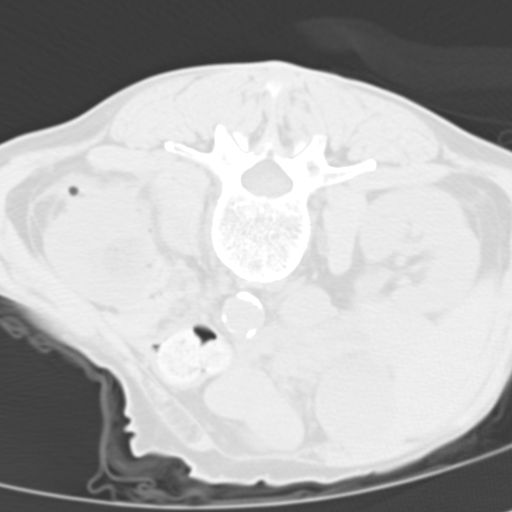

[13 of 27 positions shown; findings below may reference images not displayed]

FINDINGS: After placing a superior ablation probe in the tumor, initial attempt was performed of placing two tandem probes in the inferior aspect of the tumor.  Due to angle of probe insertion, as well as need for percutaneous biopsy, ultimately only two probes were needed to treat the lesion with one in the superior aspect of the tumor and one in the inferior aspect of the tumor.  Successful percutaneous biopsy was performed prior to ablation yielding satisfactory core material.  
 After the initial cycle of radiofrequency ablation through two probes, adequate tissue temperatures were achieved through the inferior probe.  The superior probe did have some marginal temperatures along the posterior margin of the tumor, and therefore, this needle was readvanced slightly and an overlapping treatment performed with a second 12-minute cycle.  This did result in more adequate tissue temperatures in the superior aspect of the tumor.  After the procedure, imaging does show postprocedural hemorrhage, just anterior to the lesion and also posterior and inferior to the lower left kidney.  The patient will be closely monitored with initial recovery in PACU followed by return to her hospital room.  Laboratory test followup will be performed.
IMPRESSION: CT guided percutaneous core biopsy of left renal tumor followed by RF ablation via two separate ablation probes placed into the tumor.  Two cycles of ablation were performed with the first via two probes positioned in the mass and the second via a single probe positioned in the mass.  Adequate tissue temperatures were achieved during ablation.  The procedure was complicated by some degree of perinephric hemorrhage, which will be followed.

## 2009-08-02 IMAGING — CR DG CHEST 2V
2 series · 2 of 2 positions shown · non-contrast
Comparison: 01/16/07.

CLINICAL DATA: The patient is scheduled for biopsy and ablation of a left renal tumor under general anesthesia.  Preoperative respiratory exam.
 CHEST ? 2 VIEW:

[w chest pa]
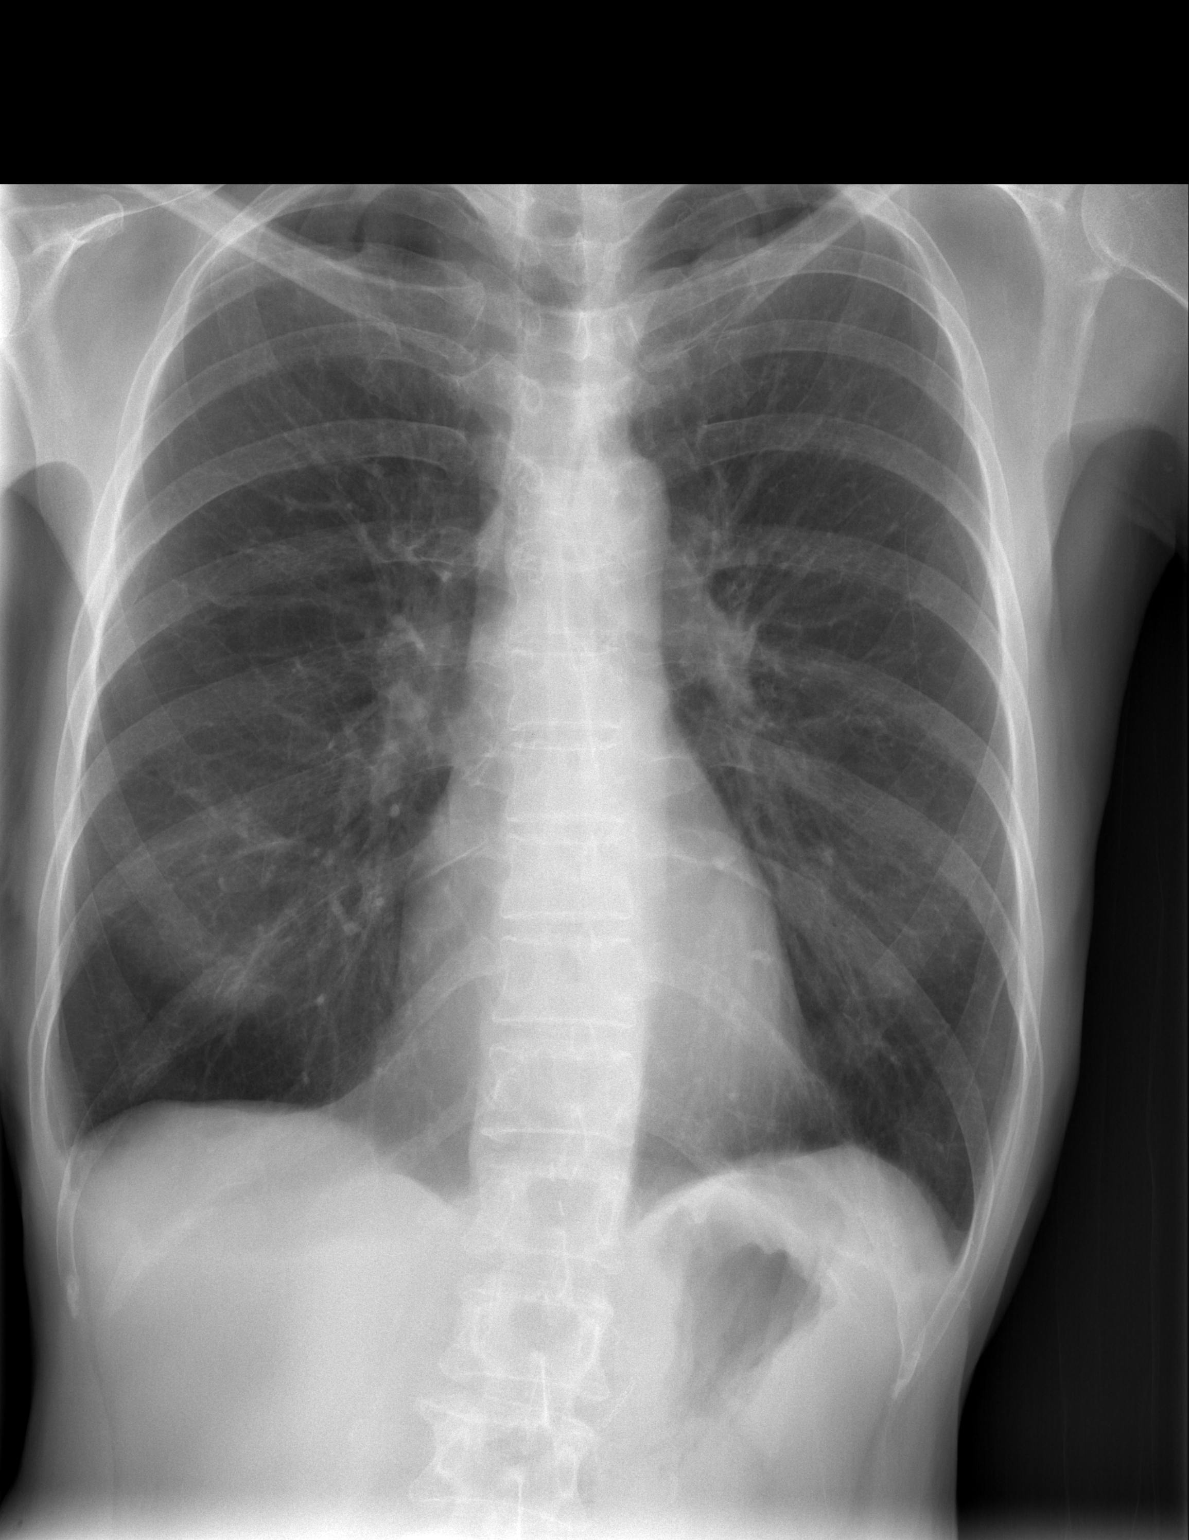

[w chest lat]
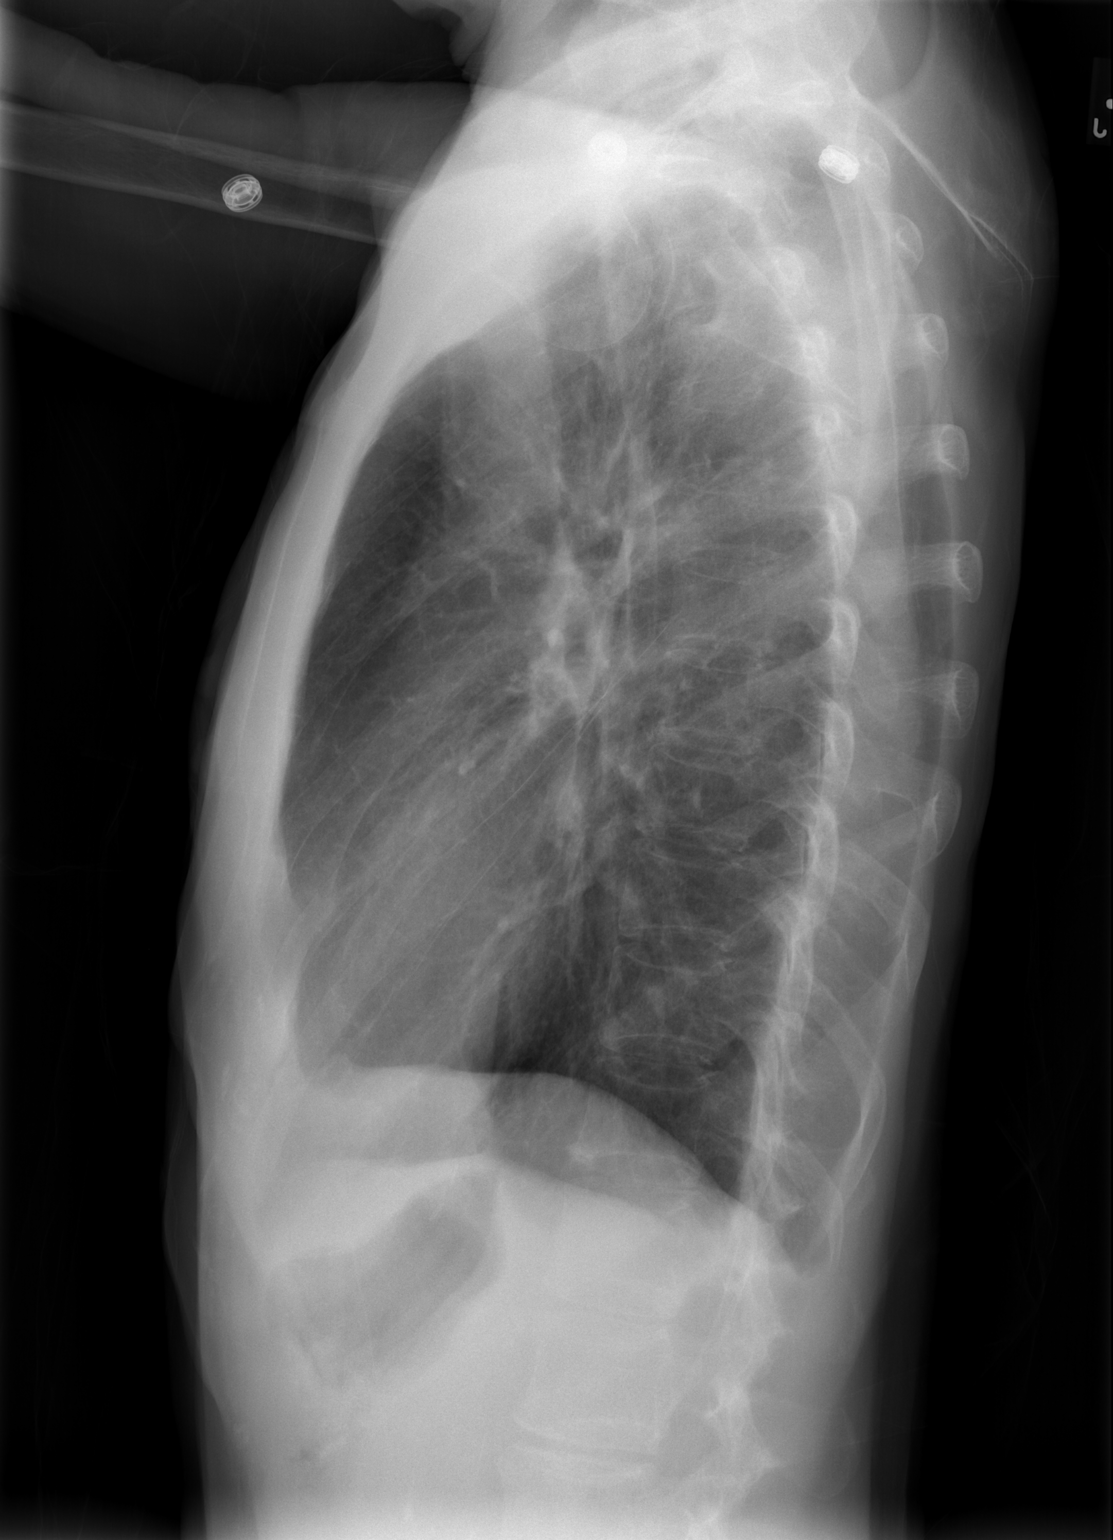

[2 of 2 positions shown; findings below may reference images not displayed]

FINDINGS: Stable appearance of COPD.  No acute infiltrate, edema, mass lesion or pleural fluid is identified.  The heart and mediastinal contours are within normal limits.
IMPRESSION: Stable COPD.  No active disease.

## 2009-08-02 IMAGING — US IR FLUORO GUIDE CV LINE*L*
1 series · 1 of 1 positions shown · non-contrast
Comparison: none

CLINICAL DATA: Pancreatitis.  Left renal mass.  PICC line has been requested for IV access prior to radiofrequency ablation. 
 LEFT UPPER EXTREMITY PICC PLACEMENT WITH ULTRASOUND AND FLUOROSCOPIC GUIDANCE: 
 Procedure:  The left arm was prepped with Betadine, draped in the usual sterile fashion, and infiltrated locally with 1% lidocaine.  Ultrasound demonstrated patency of the left brachial vein.  Under real-time ultrasound guidance, this vein was accessed with a 21-gauge micropuncture needle.  Ultrasound image documentation was performed.  The needle was exchanged over a guidewire for a peel-away sheath, through which a 5-French dual-lumen Power-PICC catheter trimmed to 37.5 cm was advanced, positioned with its tip at the distal SVC/right atrial junction.  Fluoroscopy during the procedure and fluoroscopic spot radiograph confirm appropriate catheter tip position.  The catheter was flushed, secured to the skin with Prolene sutures, and covered with a sterile dressing.  No immediate complications.
 Fluoro Time:  0.2 minutes.

[Series 1: sp us guide vasc access*right* · 1 of 1 slices shown]
[im 1/1]
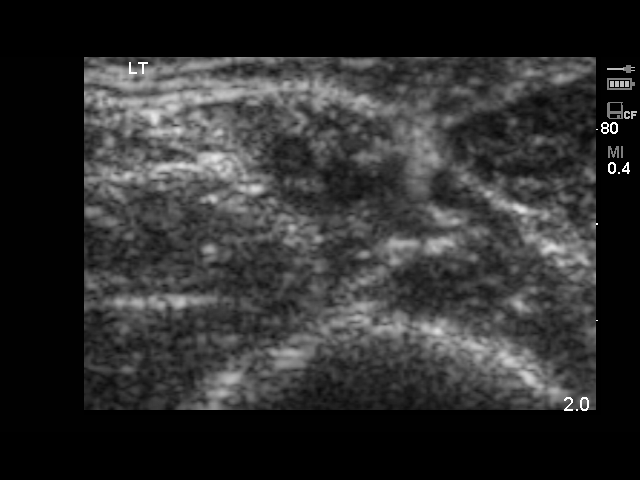

[1 of 1 positions shown; findings below may reference images not displayed]

IMPRESSION: Technically successful left arm PICC placement with ultrasound and fluoroscopic guidance.  Ready for routine use.

## 2009-08-07 ENCOUNTER — Emergency Department (HOSPITAL_COMMUNITY): Admission: EM | Admit: 2009-08-07 | Discharge: 2009-08-07 | Payer: Self-pay | Admitting: Emergency Medicine

## 2009-09-06 IMAGING — CT CT ABDOMEN W/O CM
4 series · 14 of 46 positions shown, 19 images · non-contrast
Comparison: 11/10/2006

ABDOMEN CT WITHOUT CONTRAST:

CLINICAL DATA: Five weeks out from renal RFA
TECHNIQUE: Multidetector CT imaging of the abdomen was performed following the
standard protocol without IV contrast.

[Series 2: without iv contrast · axial · non-contrast · 0.70mm/px · z∈[-204,-124]mm · 2 of 50 slices shown, 5 images]
[im 17/50  soft-tissue]
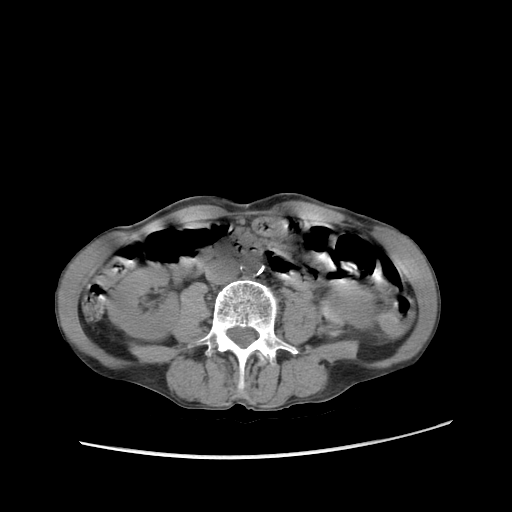
[im 17/50  lung]
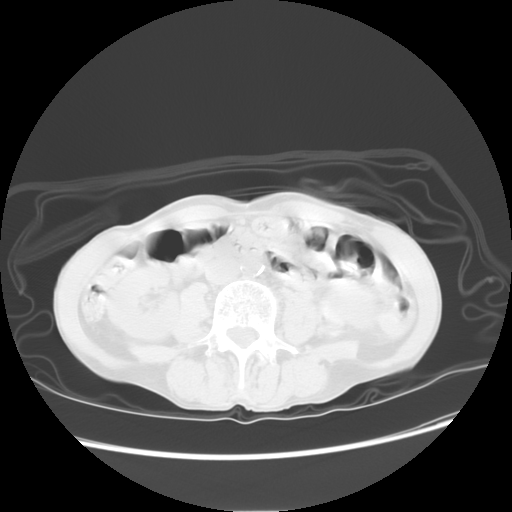
[im 17/50  bone]
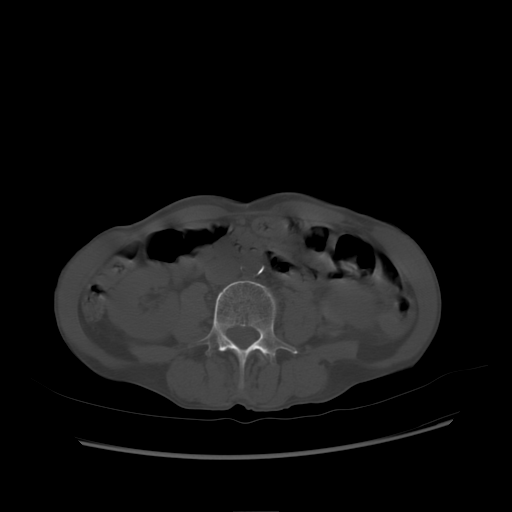
[im 33/50  soft-tissue]
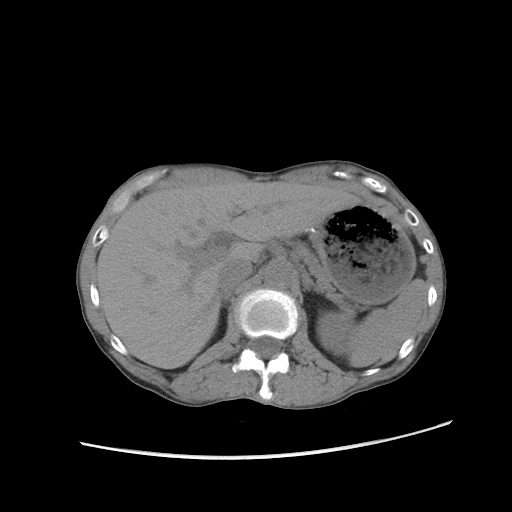
[im 33/50  lung]
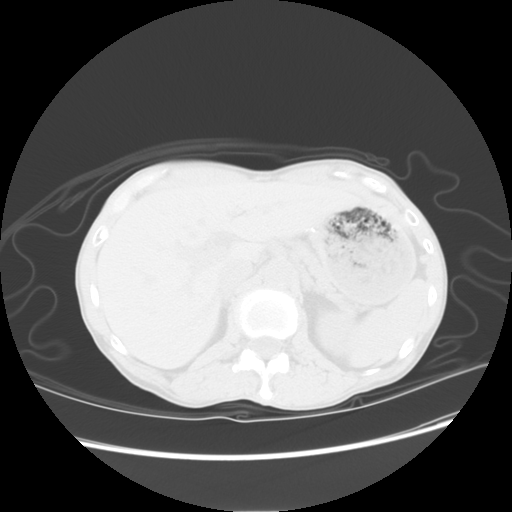

[Series 4: lung windows · axial · 0.70mm/px · z∈[-266,-58]mm · 8 of 393 slices shown]
[im 30/393  bone]
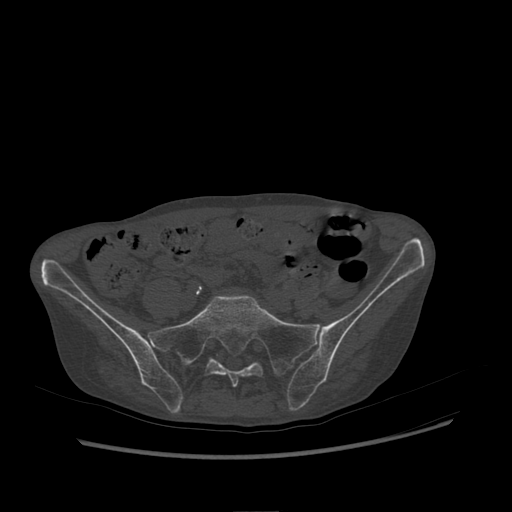
[im 73/393  bone]
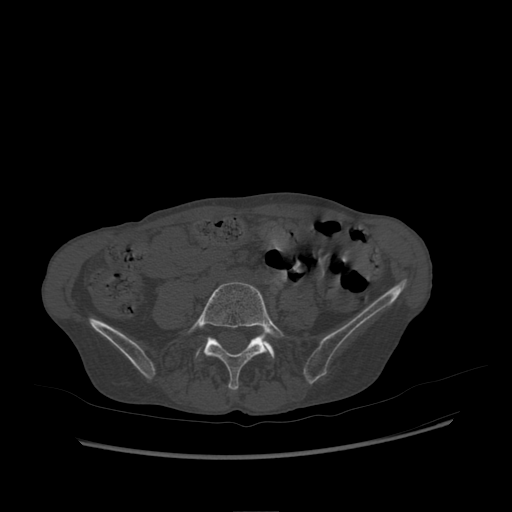
[im 131/393  bone]
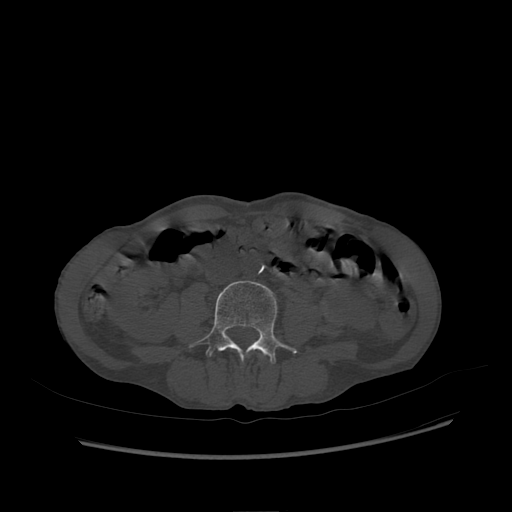
[im 175/393  bone]
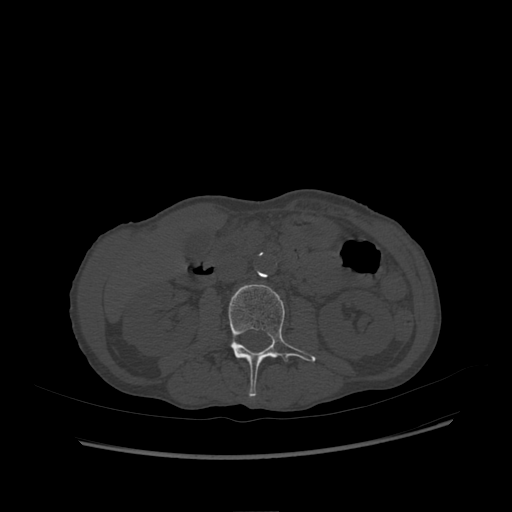
[im 218/393  bone]
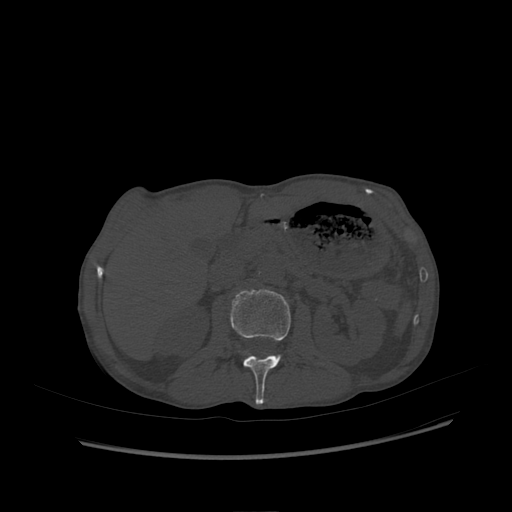
[im 262/393  bone]
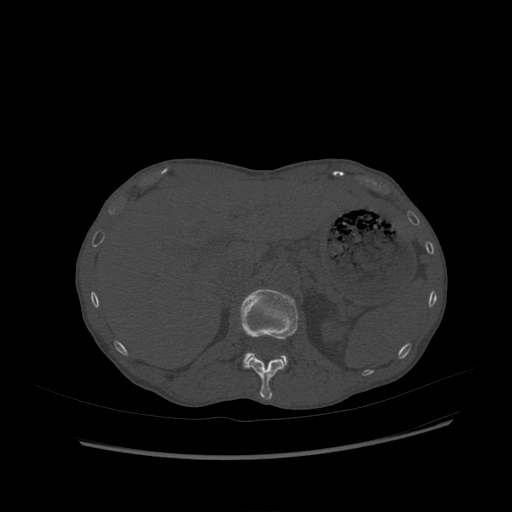
[im 320/393  bone]
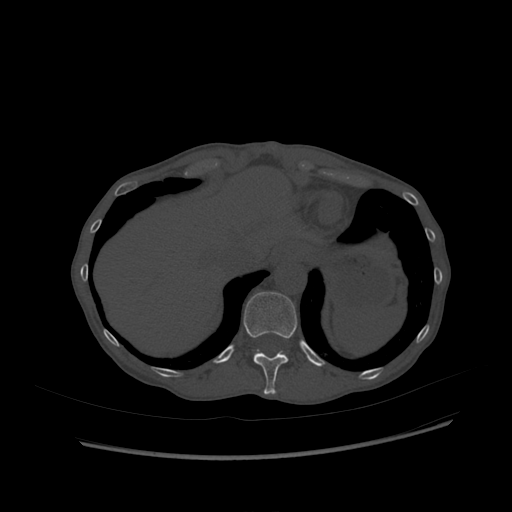
[im 363/393  bone]
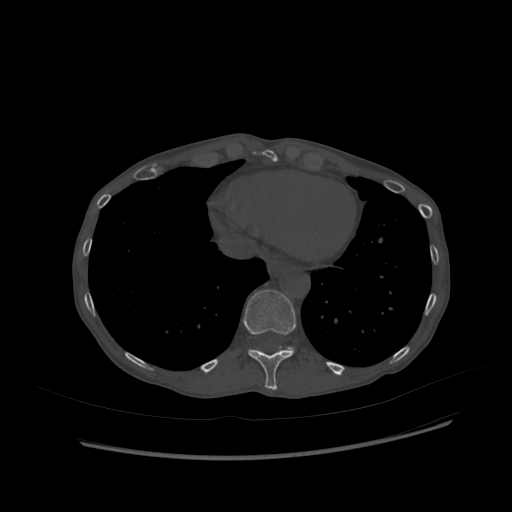

[Series 400: sagittal · sagittal · 0.70mm/px · 1 of 114 slices shown, 2 images]
[im 38/114  soft-tissue]
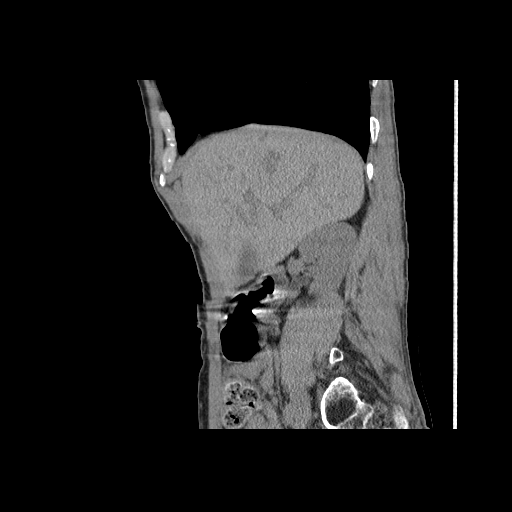
[im 38/114  bone]
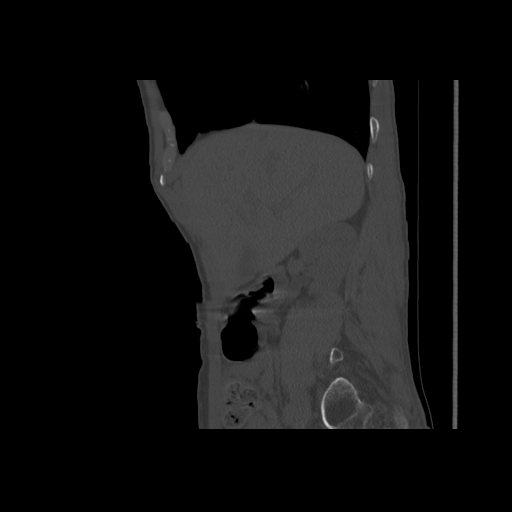

[Series 401: coronal · coronal · 0.70mm/px · 3 of 100 slices shown, 4 images]
[im 34/100  soft-tissue]
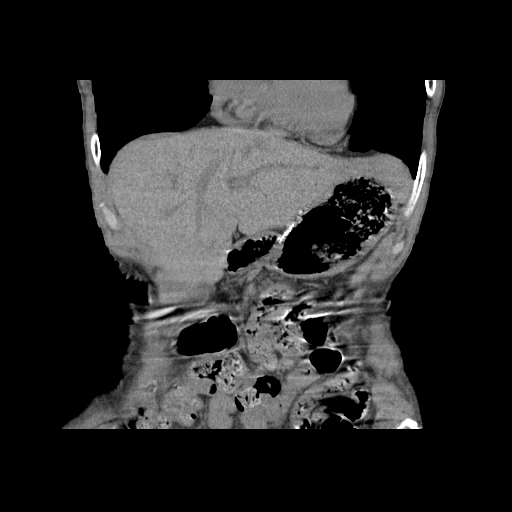
[im 45/100  soft-tissue]
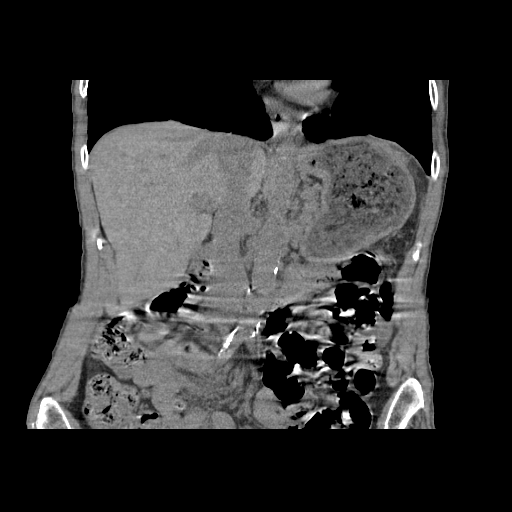
[im 45/100  bone]
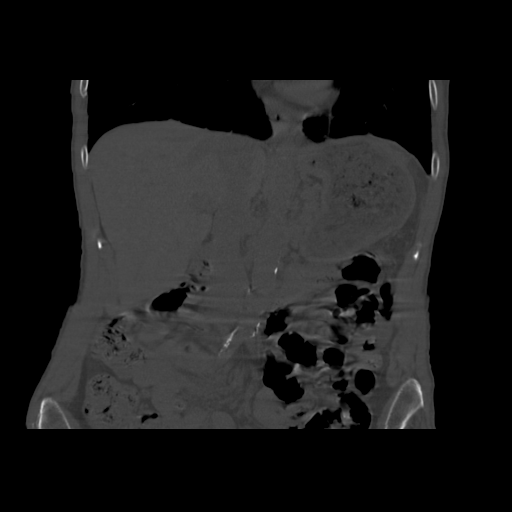
[im 56/100  soft-tissue]
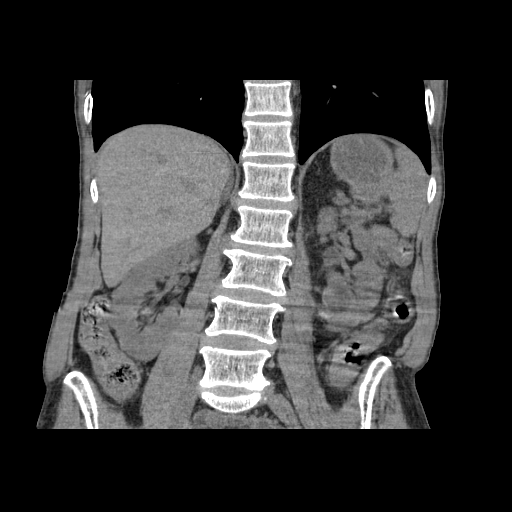

[14 of 46 positions shown; findings below may reference images not displayed]

FINDINGS: The study was performed without intravenous contrast secondary to poor
venous access. No focal abnormality is seen in the liver or spleen . The stomach
has a suture line visible along the lesser curvature. Gallbladder and adrenal
glands are unremarkable. Respiratory motion obscures the lower poles of the
kidneys, especially on the left, the site of treatment, but there is no evidence
for a subcapsular hematoma or retroperitoneal fluid/hemorrhage. There is no
hydronephrosis in either kidney.

No abdominal aortic aneurysm. No evidence for abdominal lymphadenopathy or
intraperitoneal free fluid.

Bone windows show no focal lytic osseous lesions.
IMPRESSION: The ablation defect in the lower pole the left kidney is not well seen secondary
to respiratory motion, there is no evidence for complicating features on the
study performed without intravenous contrast secondary to poor intravenous
access.

## 2009-09-24 IMAGING — CR DG CHEST 2V
3 series · 3 of 3 positions shown · non-contrast
Comparison: none

CLINICAL DATA: Medical clearance, bodyaches

CHEST - 2 VIEW:

[w chest pa (1 of 2)]
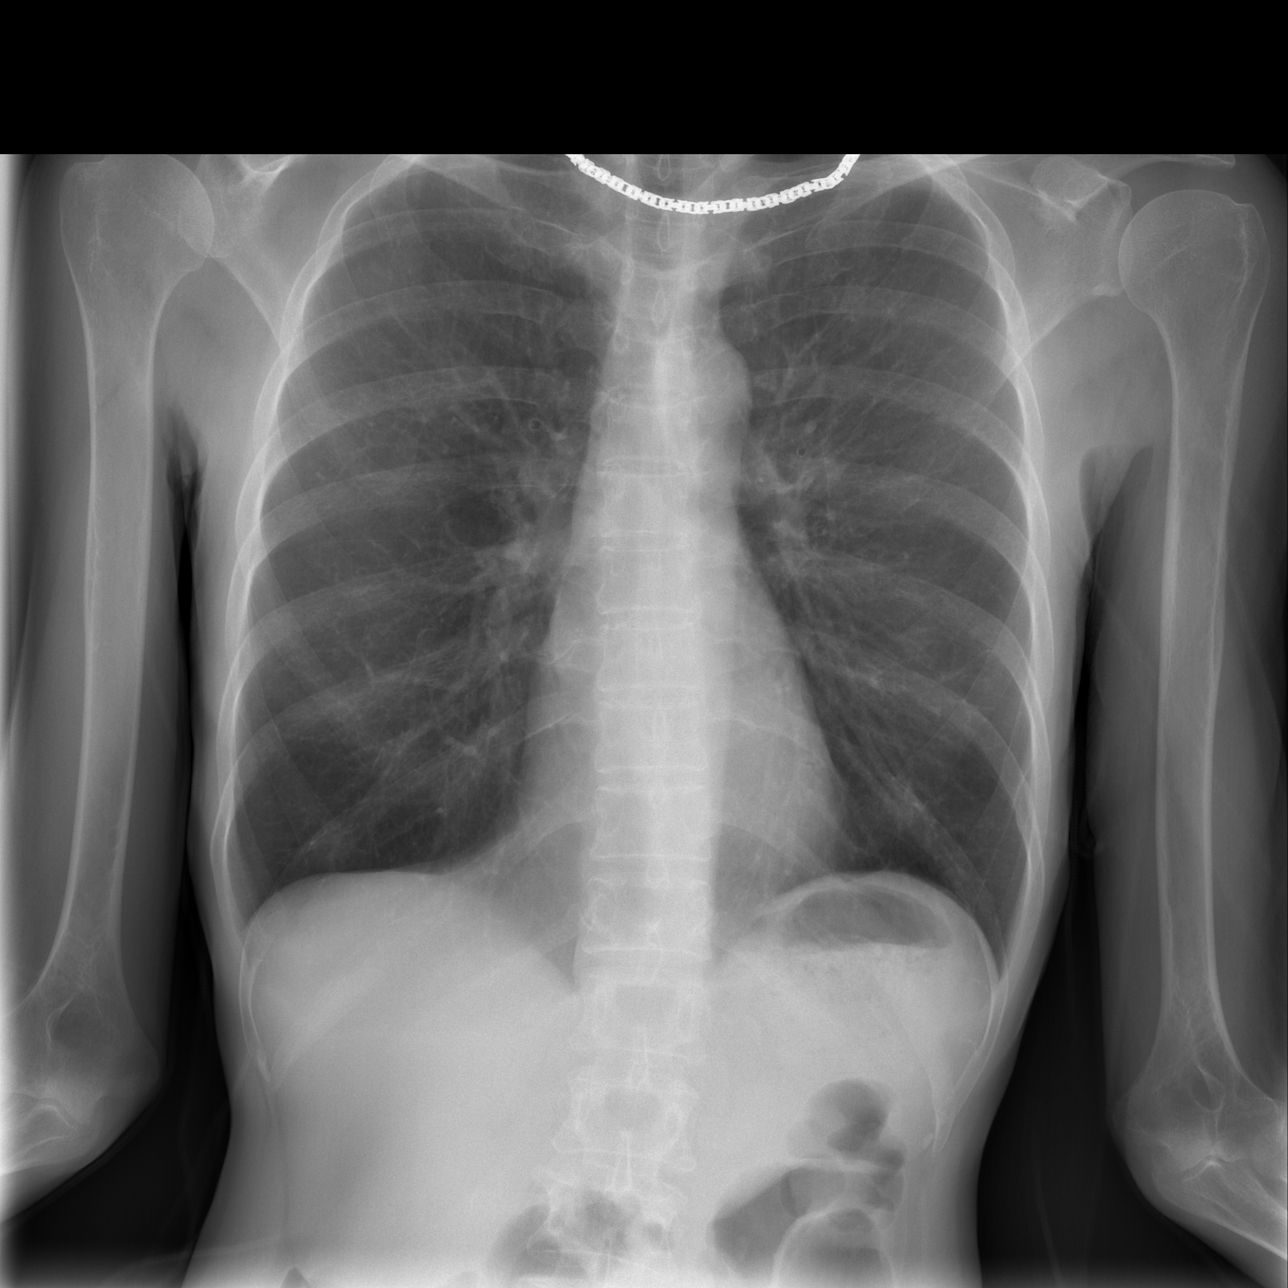

[w chest lat]
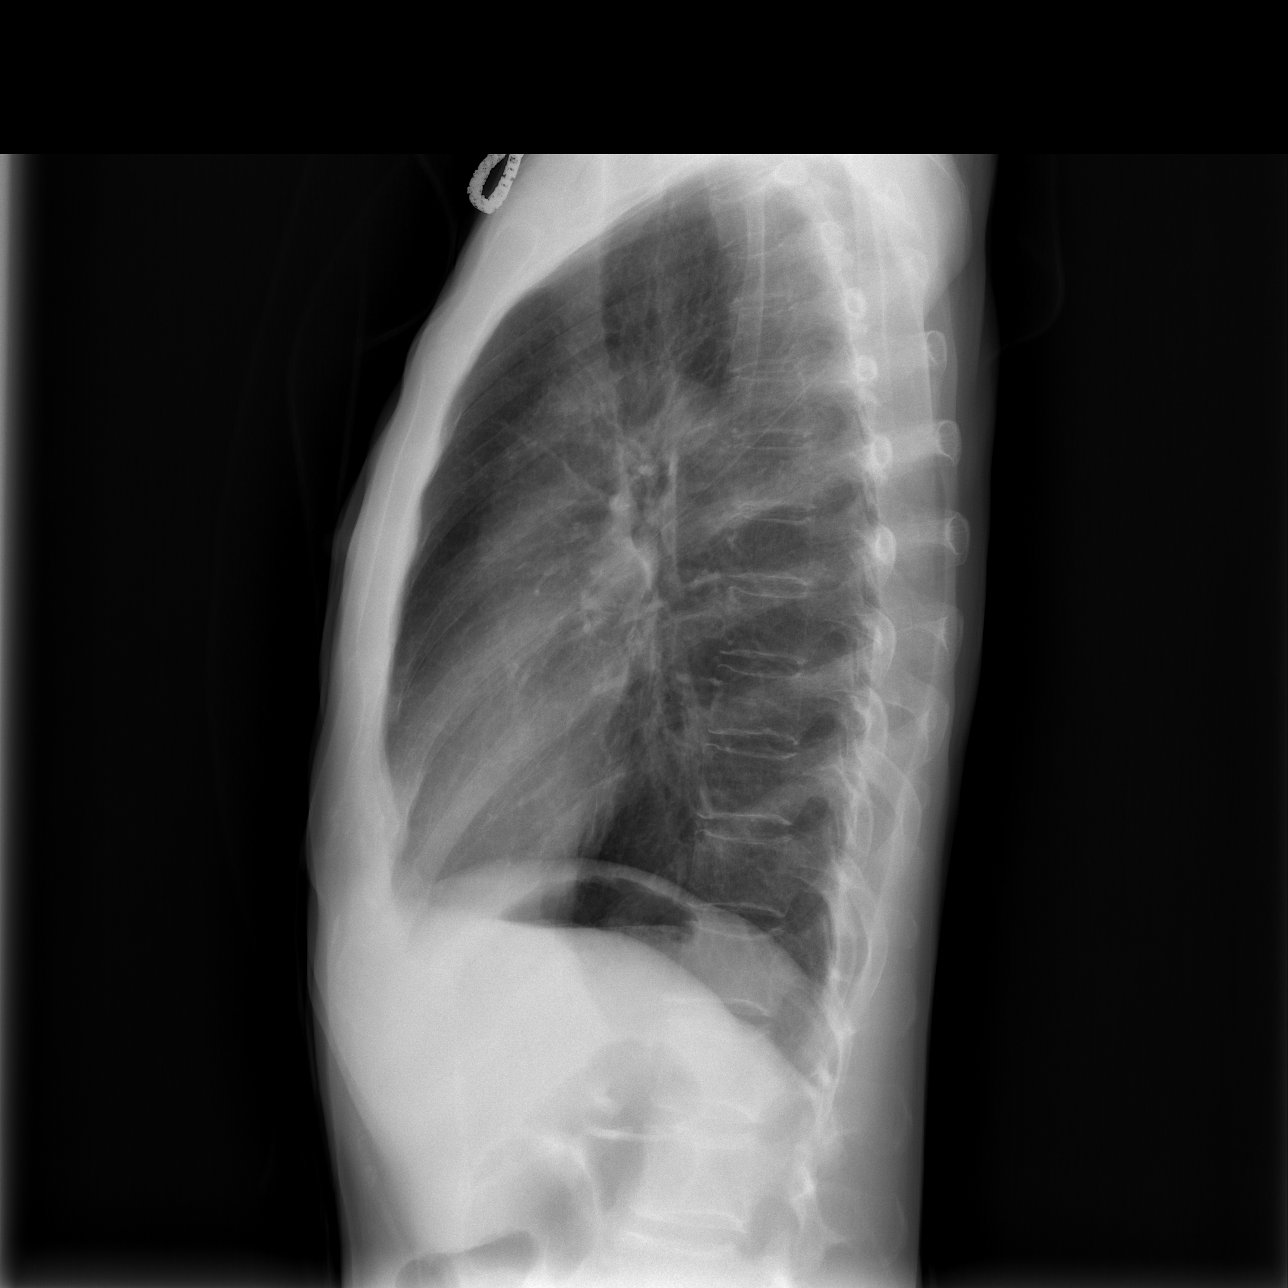

[w chest pa (2 of 2)]
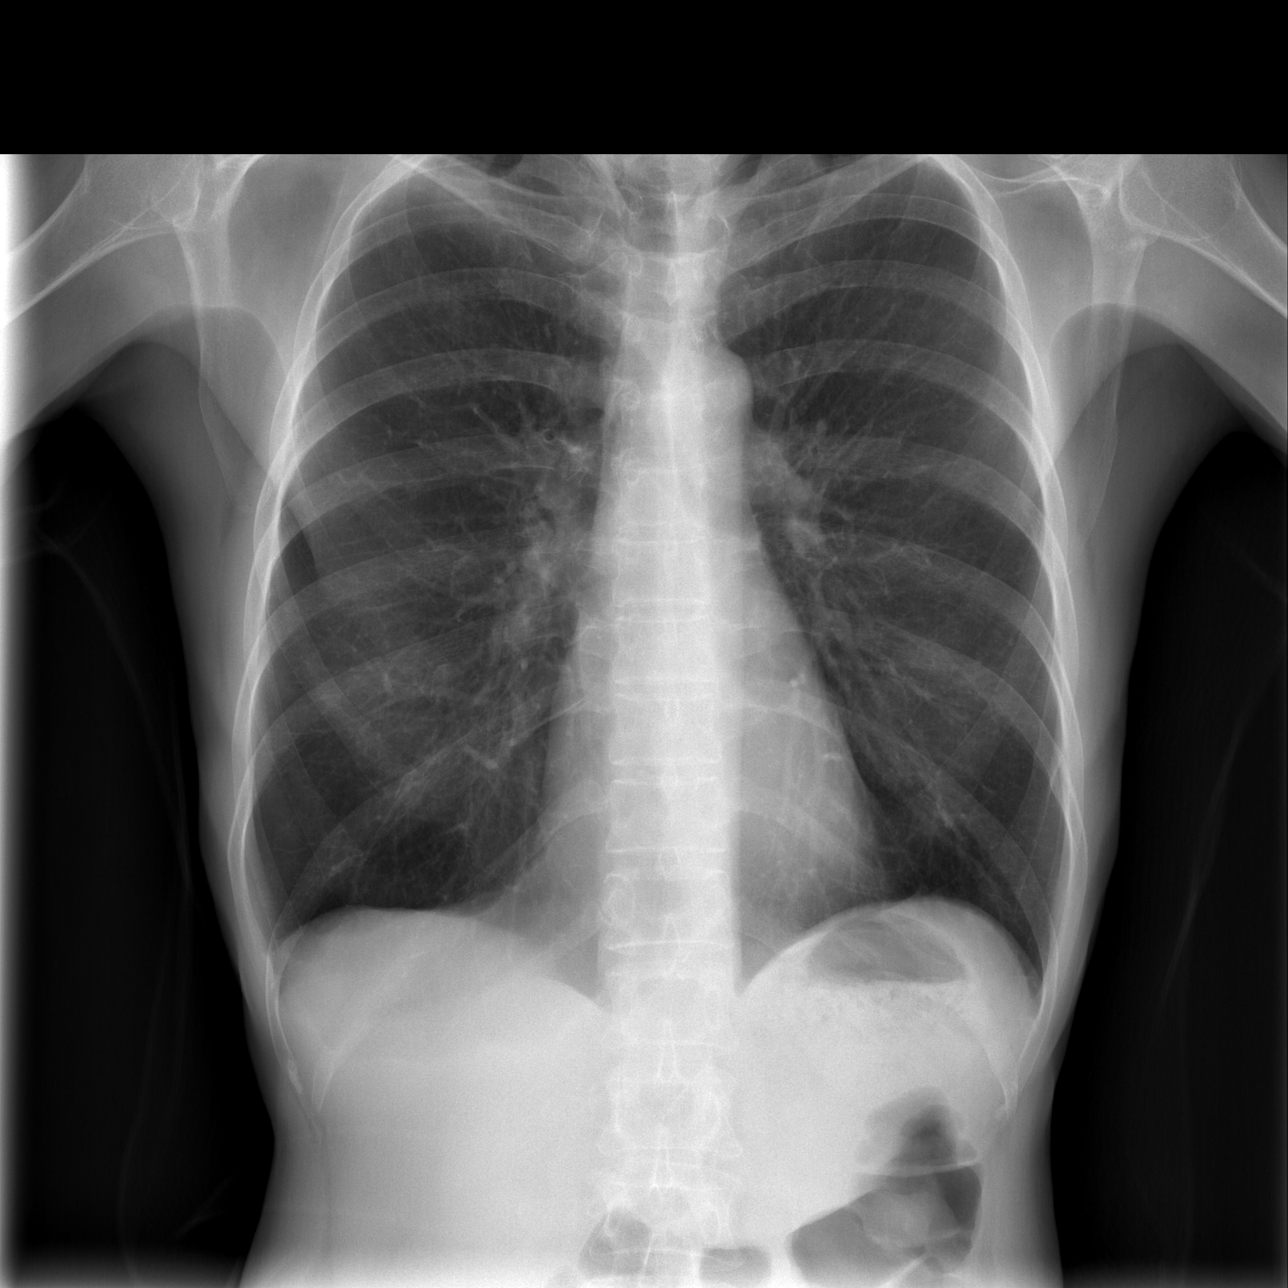

[3 of 3 positions shown; findings below may reference images not displayed]

FINDINGS: The heart size and mediastinal contours are within normal limits. 
Both lungs are clear.  The visualized skeletal structures are unremarkable. COPD
changes are noted.
IMPRESSION: No active cardiopulmonary disease.

COPD.

## 2009-09-29 IMAGING — MR MR LUMBAR SPINE W/O CM
4 of 8 series · 15 of 48 positions shown · IV contrast (agent unspecified)
Comparison: none

CLINICAL DATA: Low back pain.  Left hip pain.  
 MRI LUMBAR SPINE WITHOUT CONTRAST:
TECHNIQUE: Multiplanar and multiecho pulse sequences of the lumbar spine, to include the lower thoracic and upper sacral regions, were obtained according to standard protocol without IV contrast.

[Series 5: T2 · sagittal · 4.5mm · 0.44mm/px · 4 of 11 slices shown (1 of 2)]
[im 1/11]
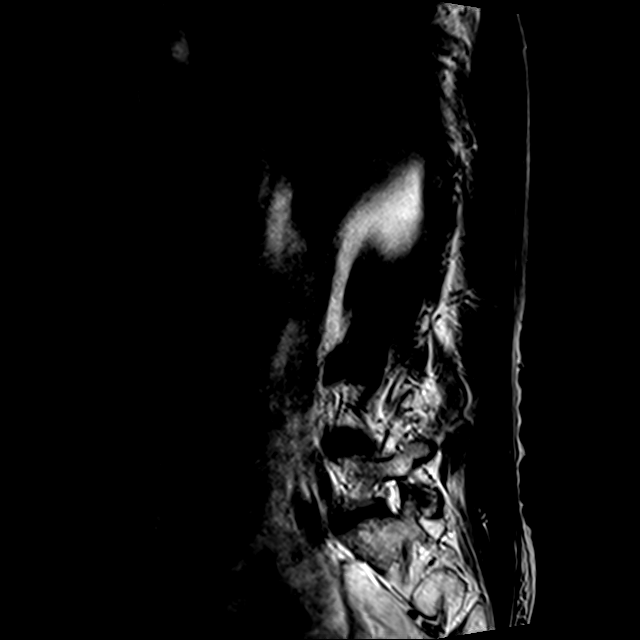
[im 4/11]
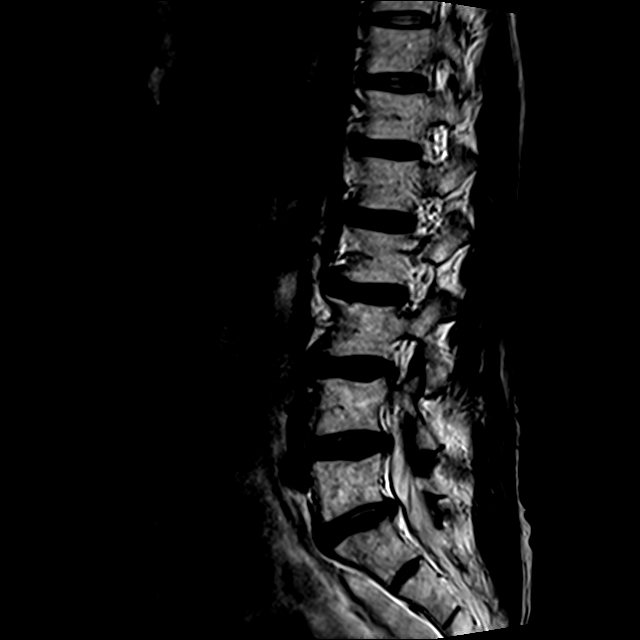
[im 7/11]
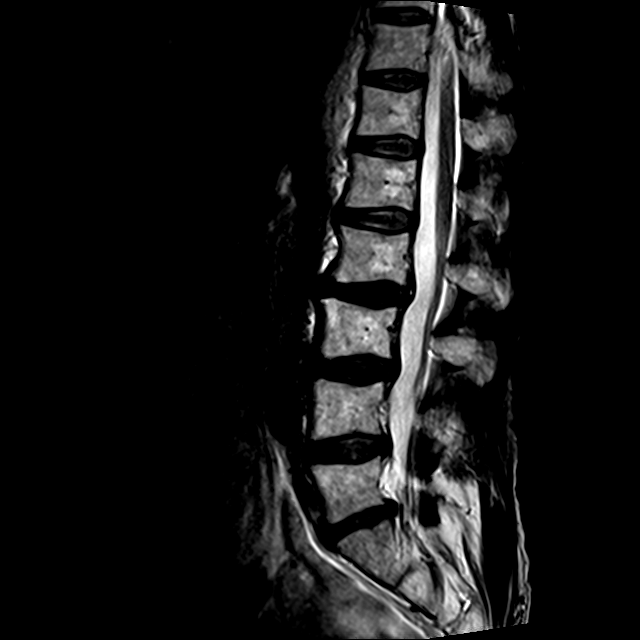
[im 11/11]
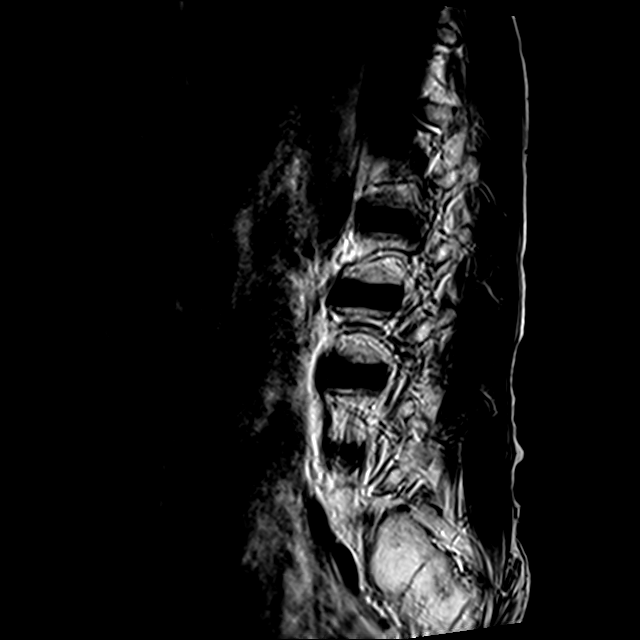

[Series 6: T1 · sagittal · 4.5mm · 0.44mm/px · 3 of 11 slices shown (1 of 2)]
[im 1/11]
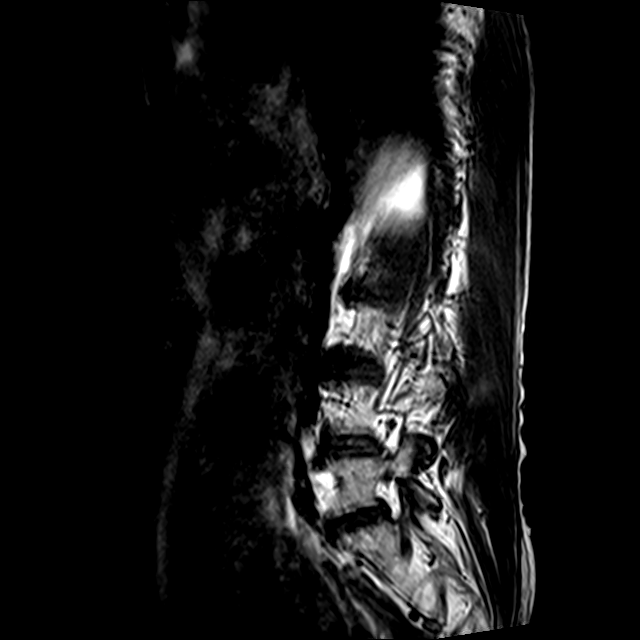
[im 7/11]
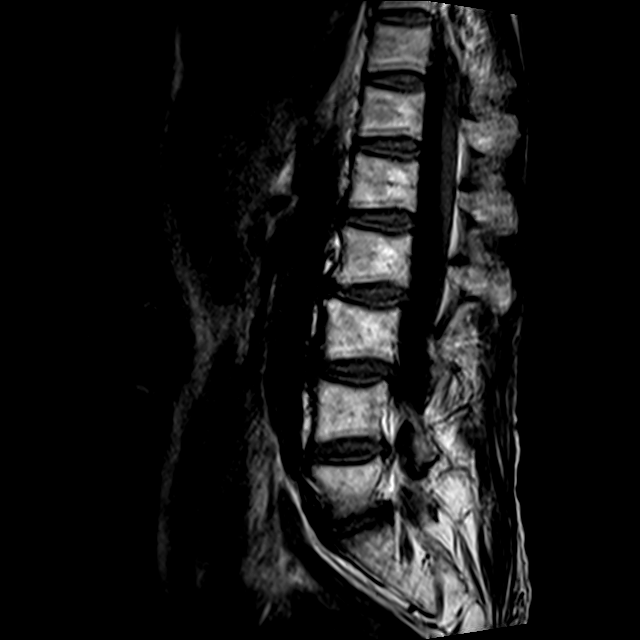
[im 11/11]
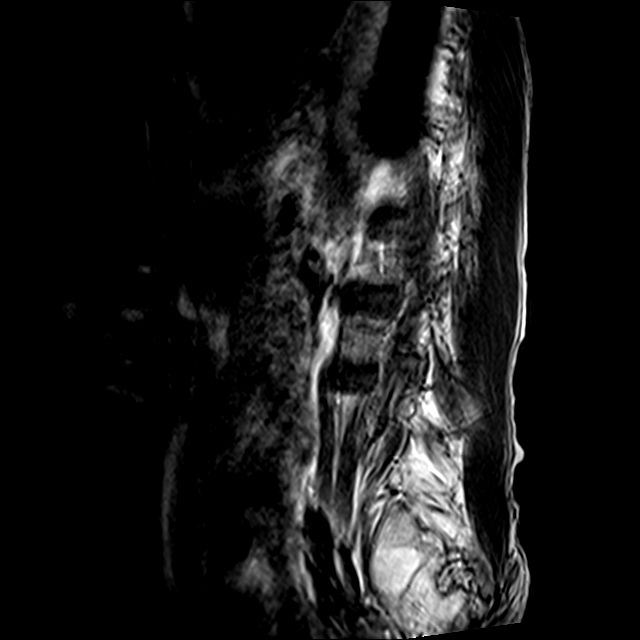

[Series 8: T1 · sagittal · 4.5mm · 0.55mm/px · 3 of 11 slices shown (2 of 2)]
[im 1/11]
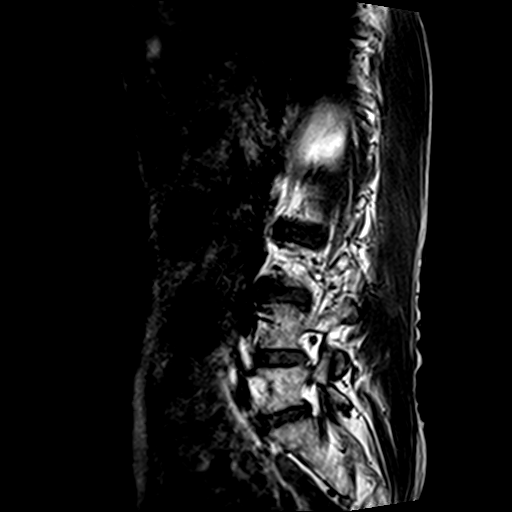
[im 7/11]
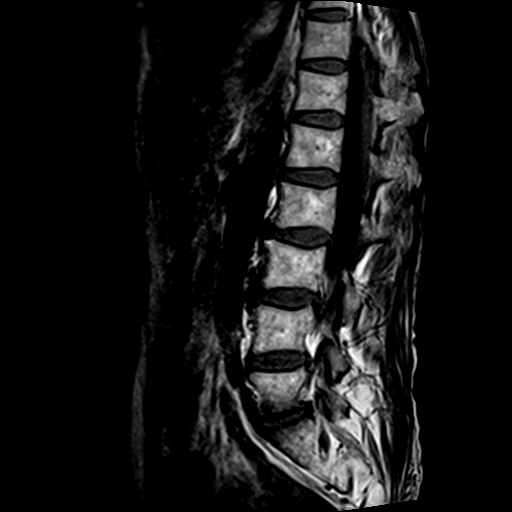
[im 11/11]
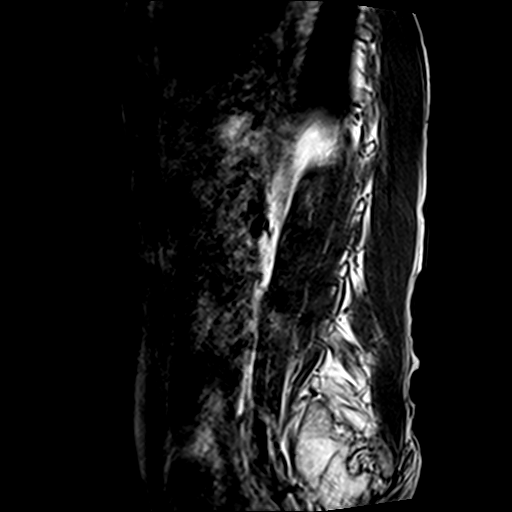

[Series 10: T2 · axial · 4.5mm · 0.51mm/px · z∈[-6,+153]mm · 5 of 31 slices shown (2 of 2)]
[im 1/31]
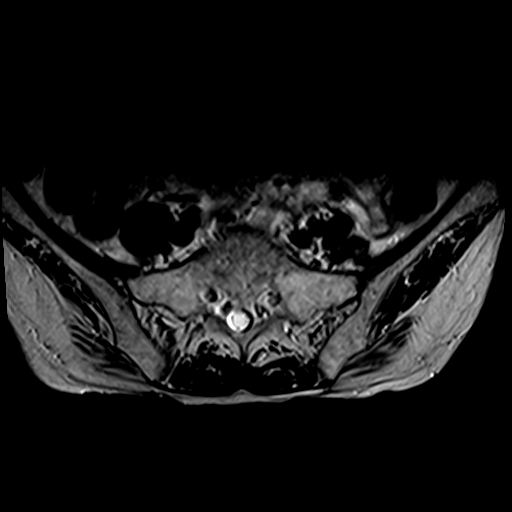
[im 6/31]
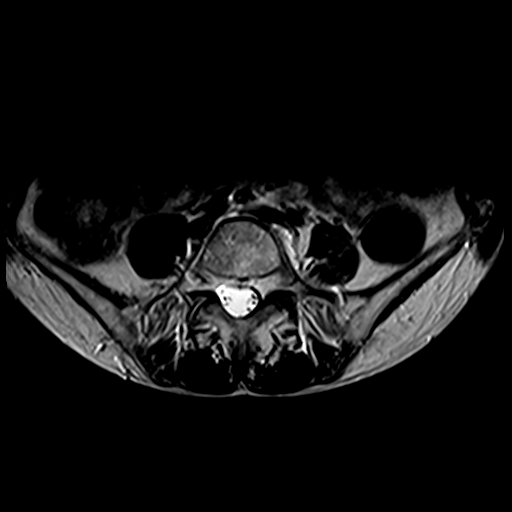
[im 9/31]
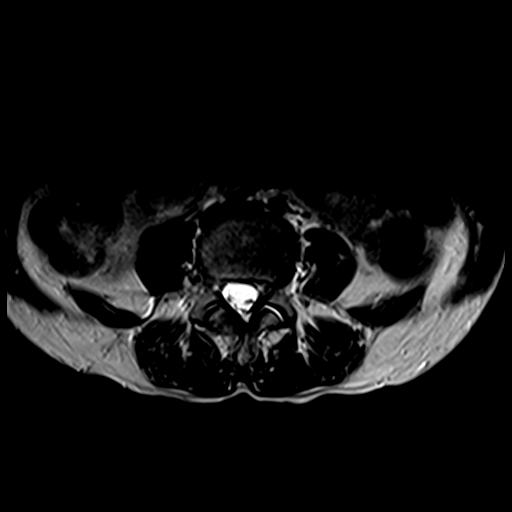
[im 17/31]
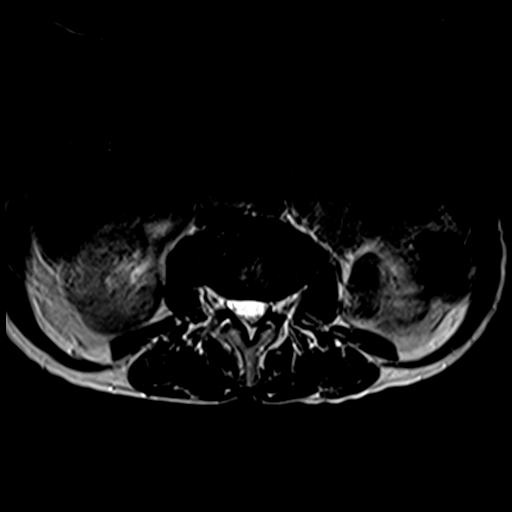
[im 28/31]
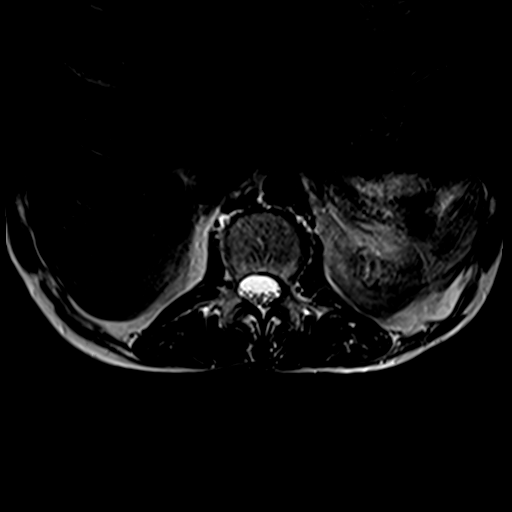

[15 of 48 positions shown; findings below may reference images not displayed]

FINDINGS: There is no abnormality at L1-2 or above other than minimal scoliosis convex to the left. 
 At L2-3, the disc shows desiccation and mild bulging but there is no herniation or apparent neural compression. 
 At L3-4, the disc shows desiccation and mild bulging but there is no apparent neural compression. 
 At L4-5, the disc shows desiccation and mild bulging but there is no apparent neural compression.  This is slightly more pronounced in the left foraminal to extraforaminal region but the same compression of the exiting L-4 nerve root is not demonstrated.  
 At L5-S1, there is disc degeneration with a shallow broad based herniation that contacts the thecal sac and the S-1 nerve roots as they bulge from the thecal sac, more towards the left.  It is possible that the left S-1 nerve root in particular could be irritated.  There is mild foraminal encroachment on the left that could possibly irritate the L-5 nerve root as it exits.
IMPRESSION: 1.  Disc bulges at L2-3 and L3-4 without apparent neural compression. 
 2.  Disc bulge at L4-5.  This is slightly prominent in the left foraminal to extraforaminal region but it does not appear to grossly compress the left L-4 nerve root.  It is conceivable that the nerve root could be irritated however. 
 3.  L5-S1:  Broad based disc herniation slightly more pronounced towards the left.  This contacts the thecal sac and the S-1 nerve roots and could irritate either of them, more likely the left.  Additionally, there is mild foraminal encroachment on the left that could possibly have some affect upon the L-5 nerve root.

## 2009-09-29 IMAGING — CT CT PELVIS W/O CM
1 of 3 series · 13 of 32 positions shown, 19 images · IV contrast (agent unspecified)
Comparison: CT 02/22/07.

CLINICAL DATA: Low back pain and left hip pain.  Clinical concern for radiographically occult left hip fracture.  
PELVIS CT WITHOUT CONTRAST:
TECHNIQUE: Multidetector CT imaging of the pelvis was performed following the standard protocol without IV contrast.

[Series 3: pelvis/standard · axial · 0.70mm/px · z∈[-298,-106]mm · 13 of 89 slices shown, 19 images]
[im 6/89  soft-tissue]
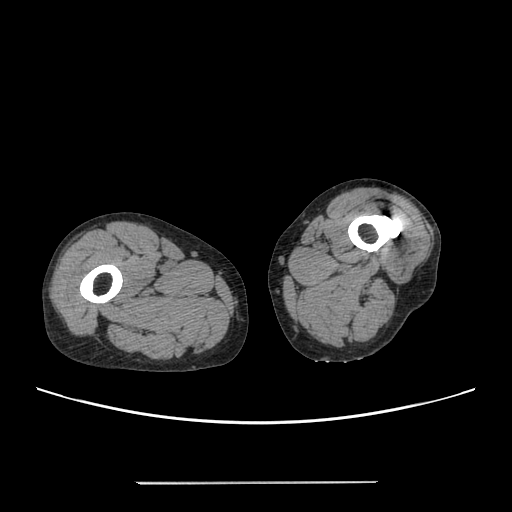
[im 6/89  bone]
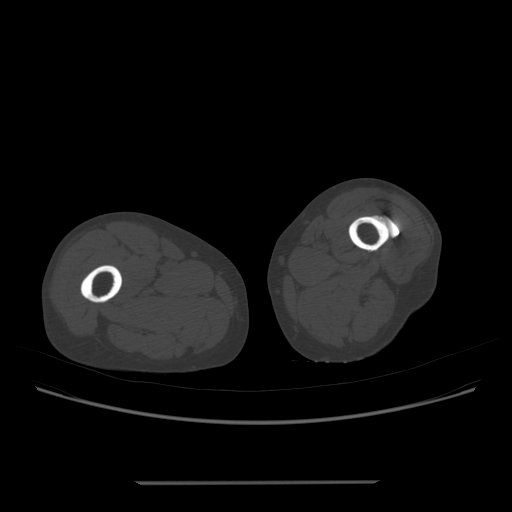
[im 12/89  soft-tissue]
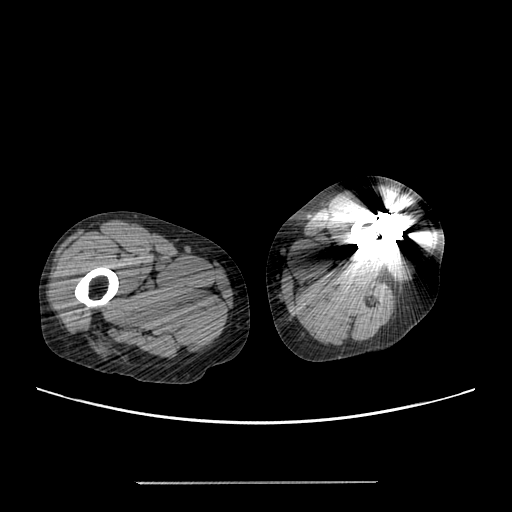
[im 18/89  soft-tissue]
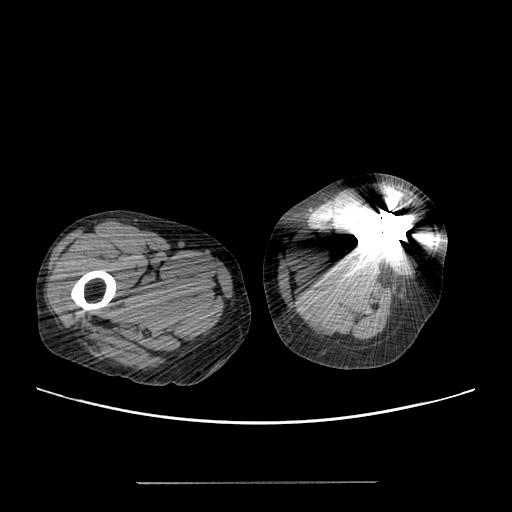
[im 24/89  soft-tissue]
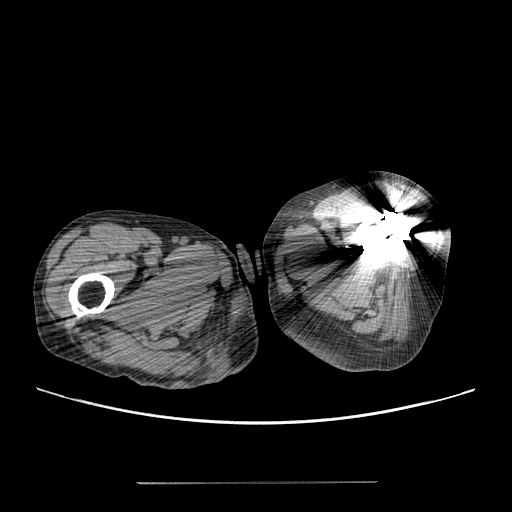
[im 30/89  soft-tissue]
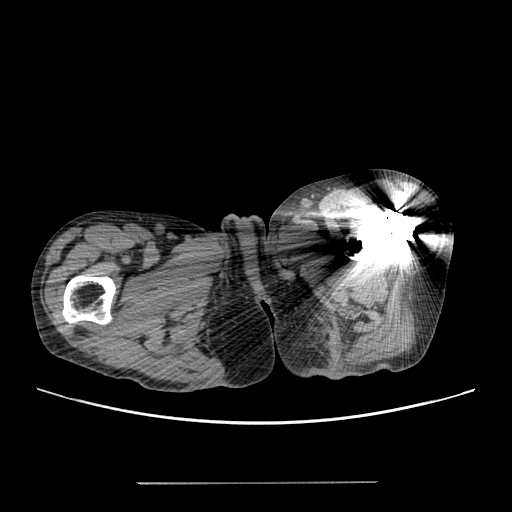
[im 36/89  soft-tissue]
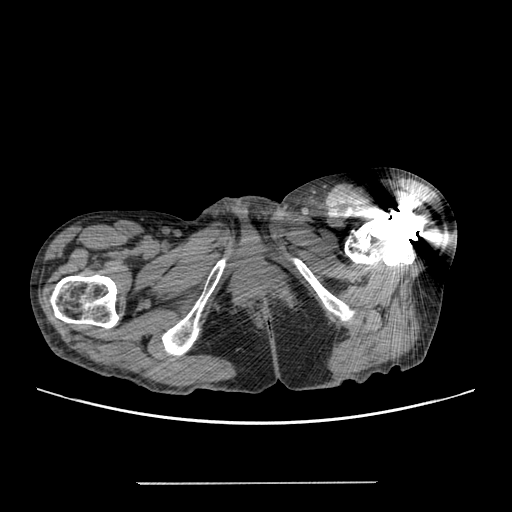
[im 47/89  soft-tissue]
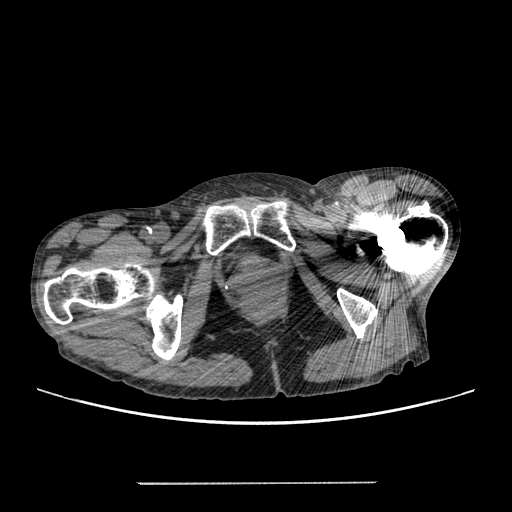
[im 53/89  soft-tissue]
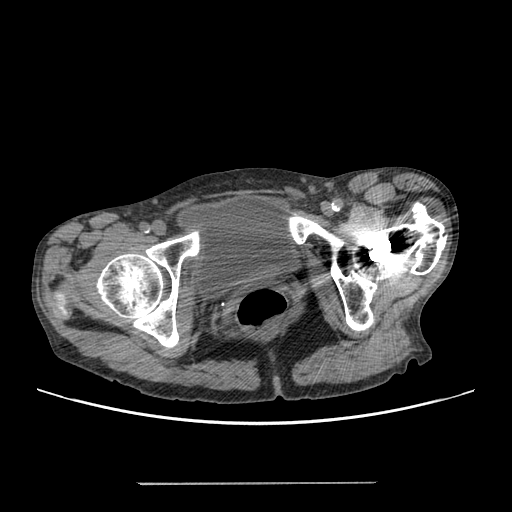
[im 59/89  soft-tissue]
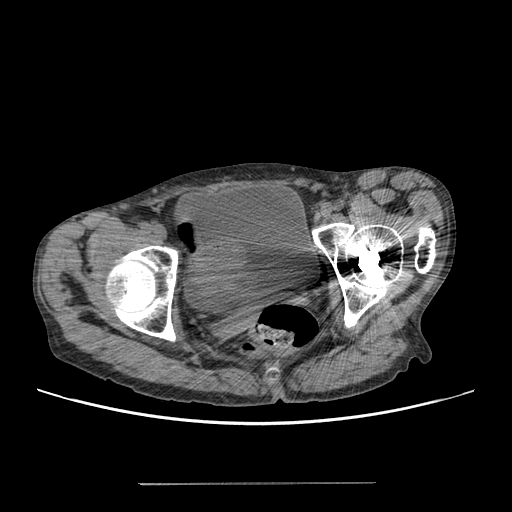
[im 59/89  bone]
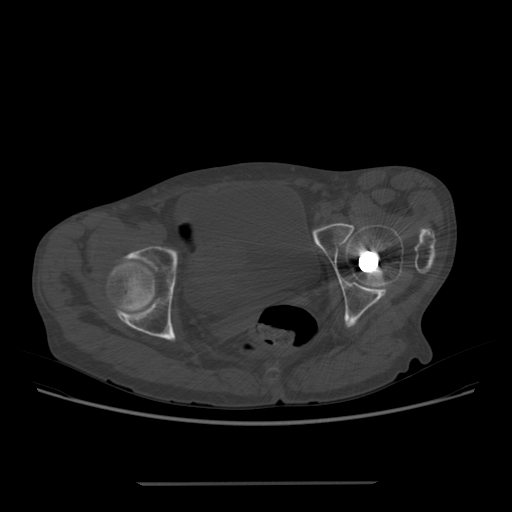
[im 65/89  soft-tissue]
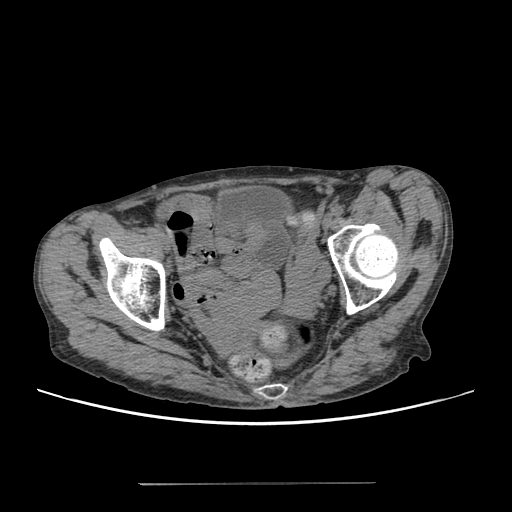
[im 65/89  lung]
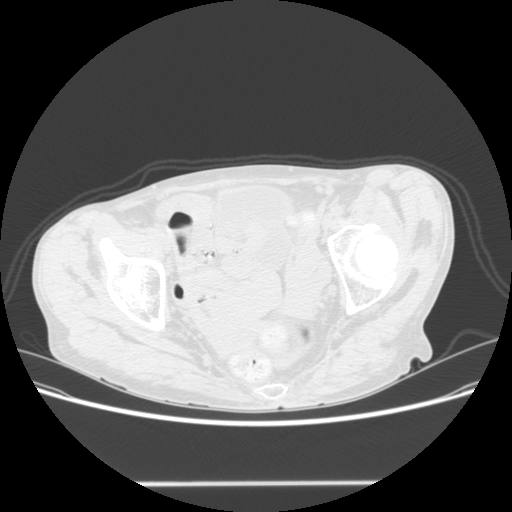
[im 71/89  soft-tissue]
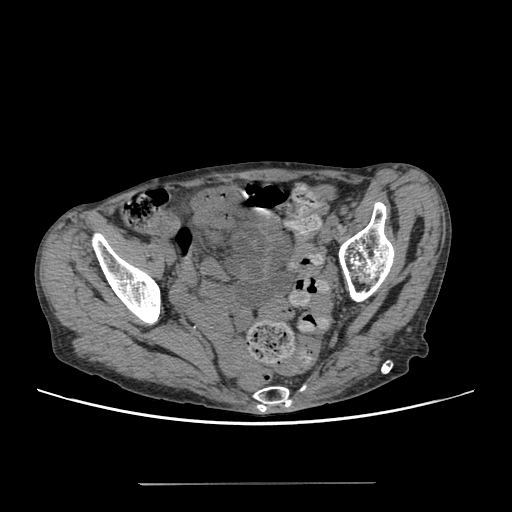
[im 71/89  lung]
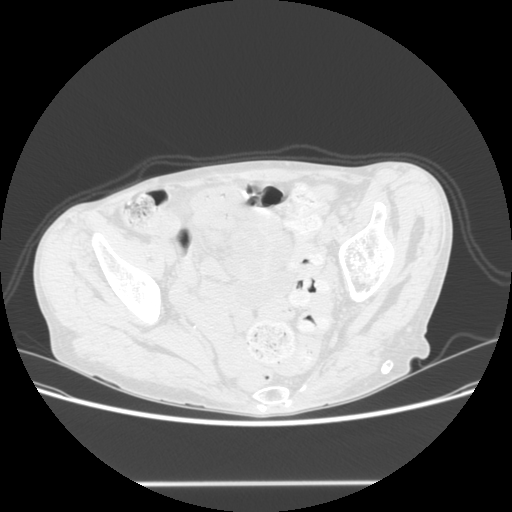
[im 77/89  soft-tissue]
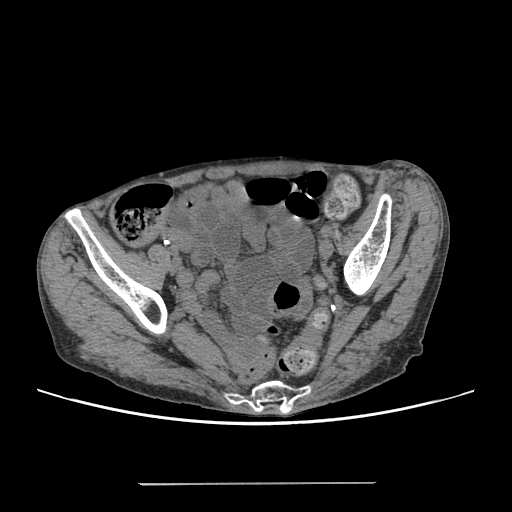
[im 77/89  lung]
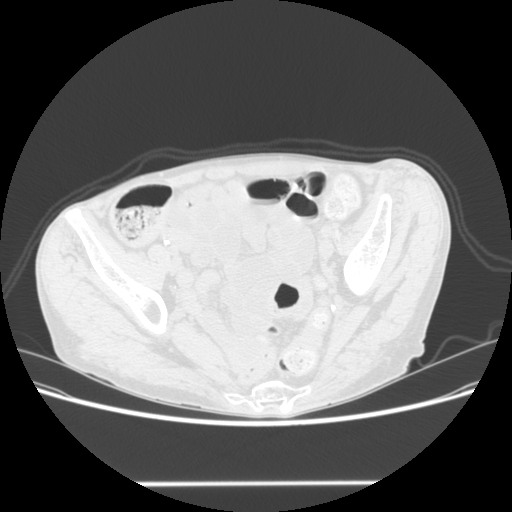
[im 83/89  soft-tissue]
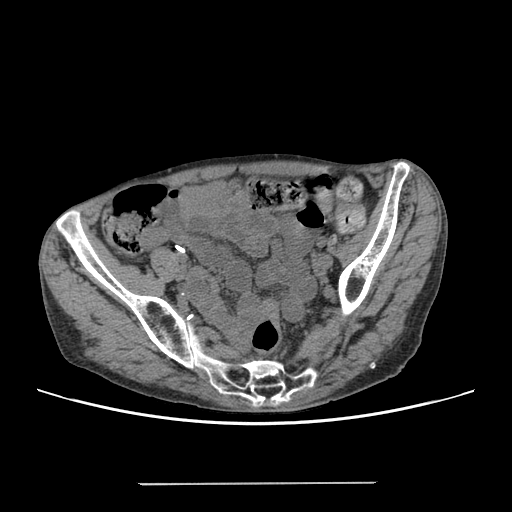
[im 83/89  lung]
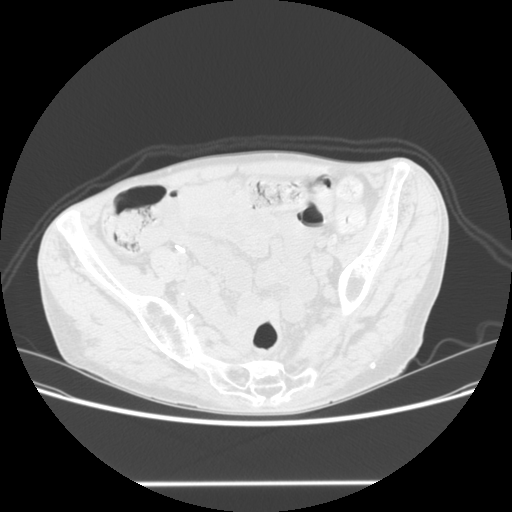

[13 of 32 positions shown; findings below may reference images not displayed]

FINDINGS: No free pelvic fluid.  Uterus is surgically absent.  Unopacified bowel is unremarkable.  Injection granulomata noted in the left buttocks.  Bones are osteopenic.  There is a left femoral dynamic nail in place with side plate and screws fixing subtrochanteric femoral fracture.  There is callus formation and residual minimal deformity but no fracture line is evident.  There is no evidence for hardware failure.
IMPRESSION: Expected postoperative appearance after left ORIF subtrochanteric femoral fracture.  No acute fracture line is seen and there is no evidence for hardware failure.

## 2009-11-11 ENCOUNTER — Emergency Department (HOSPITAL_COMMUNITY): Admission: EM | Admit: 2009-11-11 | Discharge: 2009-11-12 | Payer: Self-pay | Admitting: Emergency Medicine

## 2009-11-14 IMAGING — CT CT ABDOMEN WO/W CM
2 of 5 series · 16 of 46 positions shown, 18 images · IV contrast ([ID] OMNI 350)
Comparison: 02/22/2007

CT ABDOMEN

CLINICAL DATA: 3 months status post percutaneous radiofrequency
ablation of a left renal cell carcinoma on the 01/18/2007.  Current
left lower quadrant pain and nausea.

CT ABDOMEN WITHOUT AND WITH CONTRAST; CT PELVIS WITH CONTRAST
TECHNIQUE: Multidetector CT imaging of the abdomen and pelvis was
performed following the standard protocol before and following the
bolus administration of intravenous contrast.
Contrast: 100 ml Omnipaque 350 IV

[Series 4: arterial,venous & delay · axial · arterial · 0.57mm/px · z∈[-423,-83]mm · 13 of 174 slices shown, 15 images]
[im 8/174  soft-tissue]
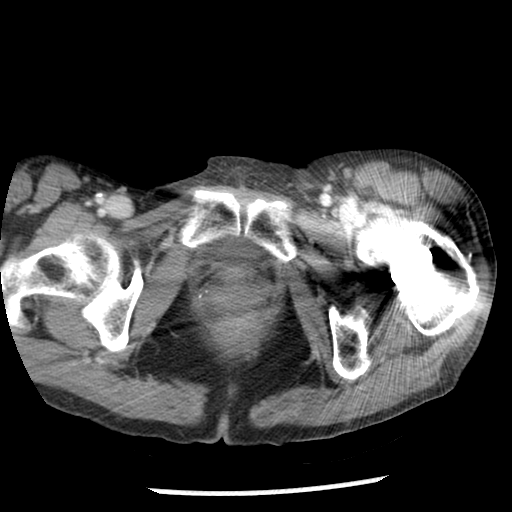
[im 8/174  bone]
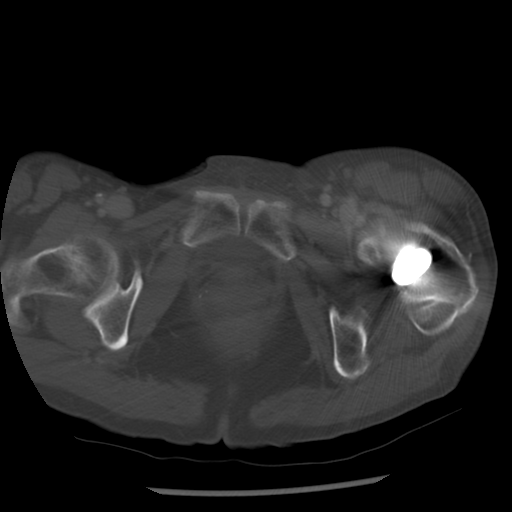
[im 24/174  soft-tissue]
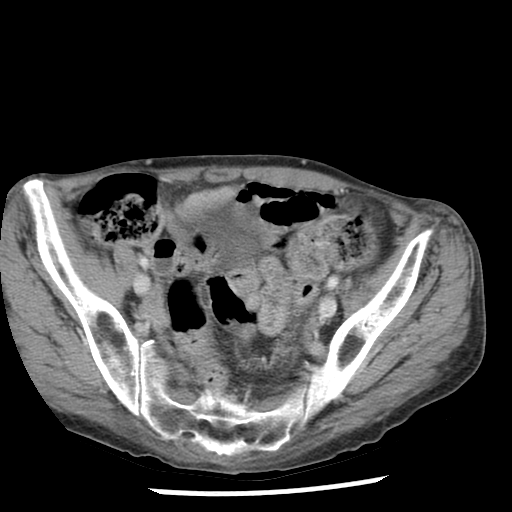
[im 40/174  soft-tissue]
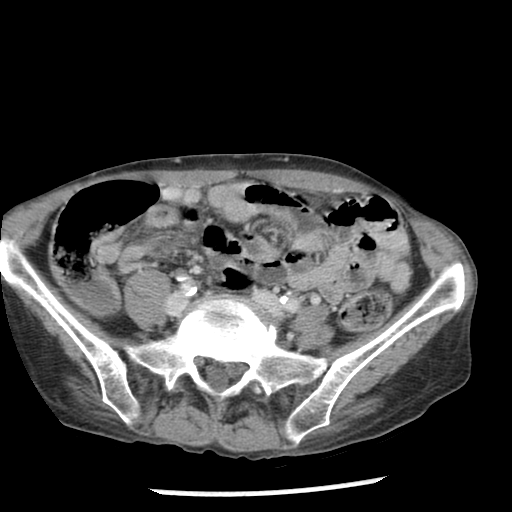
[im 48/174  soft-tissue]
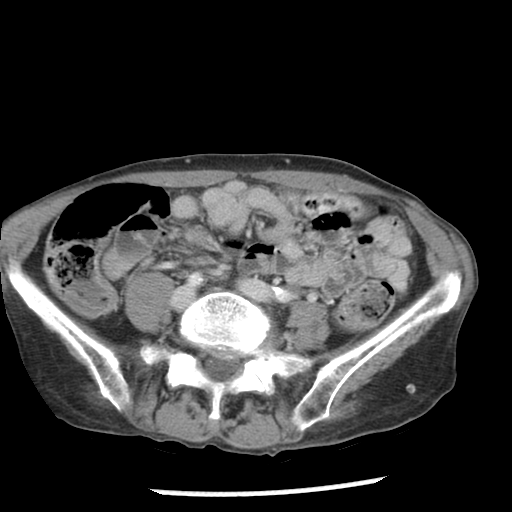
[im 63/174  soft-tissue]
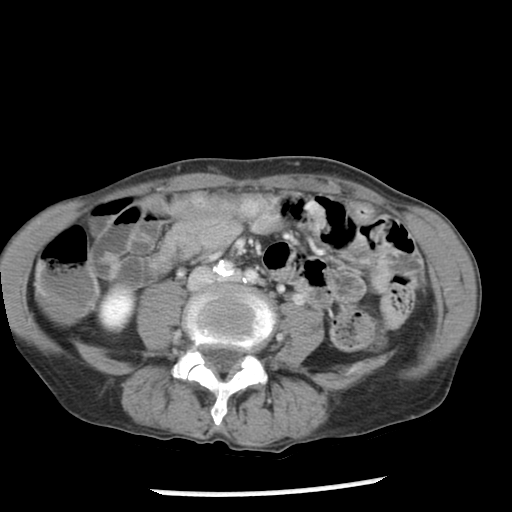
[im 71/174  soft-tissue]
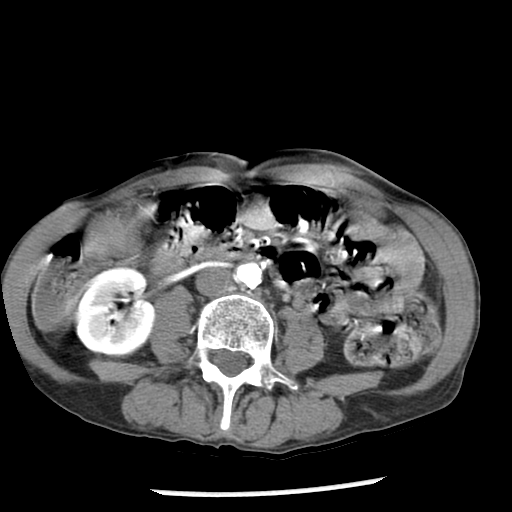
[im 87/174  soft-tissue]
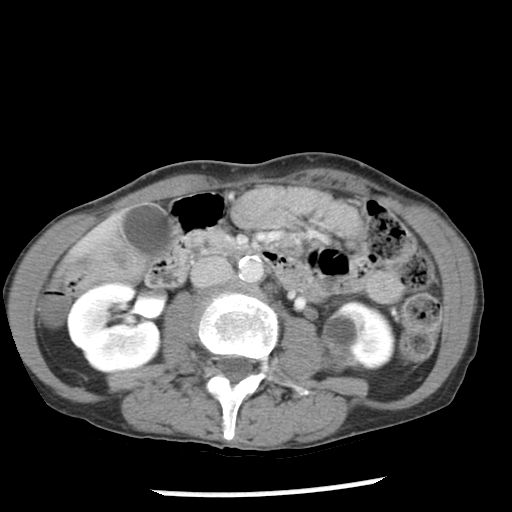
[im 103/174  soft-tissue]
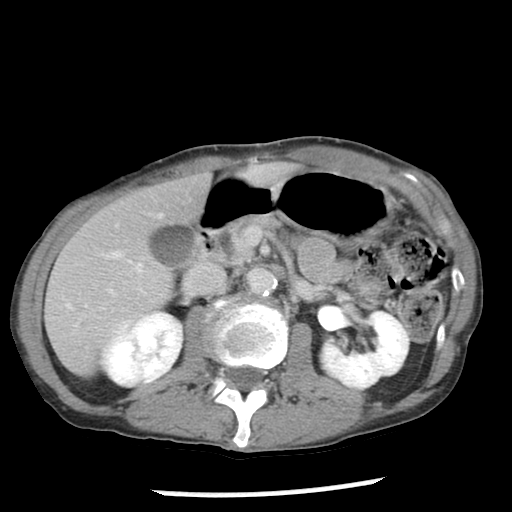
[im 111/174  soft-tissue]
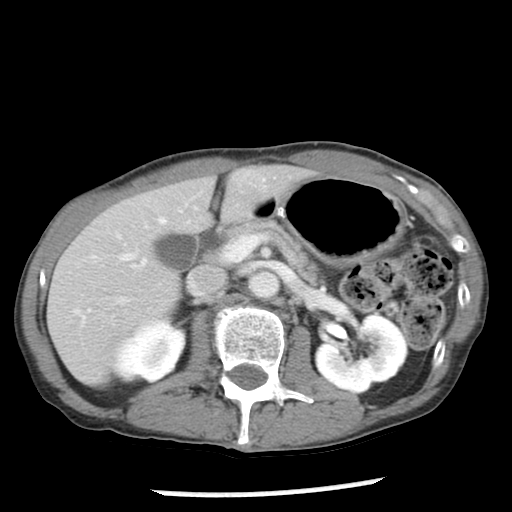
[im 111/174  bone]
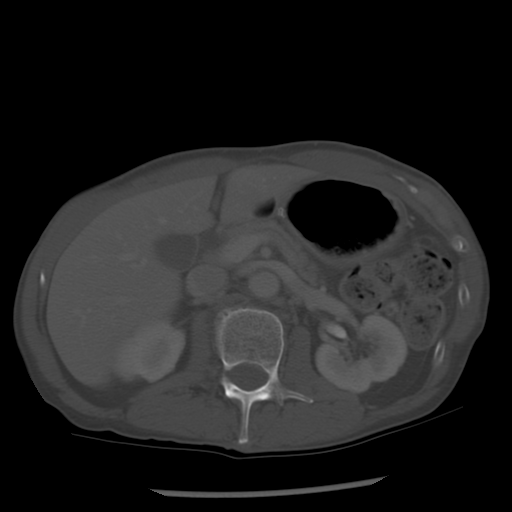
[im 126/174  soft-tissue]
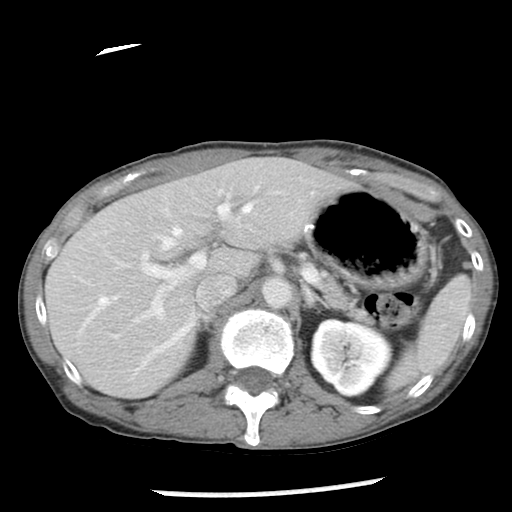
[im 134/174  soft-tissue]
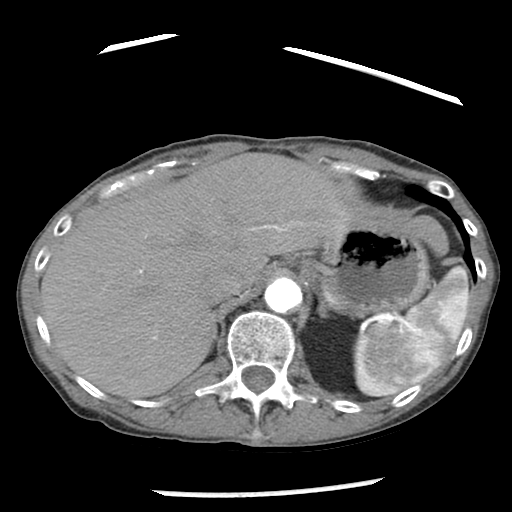
[im 150/174  soft-tissue]
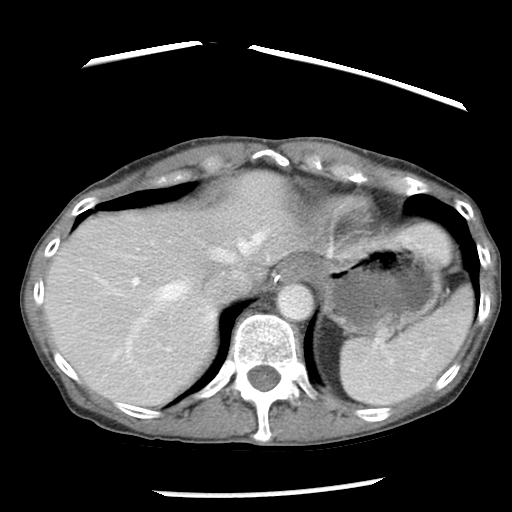
[im 166/174  soft-tissue]
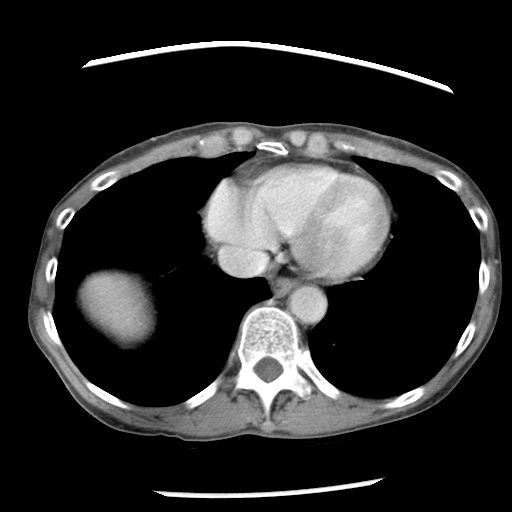

[Series 500: cor · coronal · 0.78mm/px · 3 of 78 slices shown]
[im 26/78  soft-tissue]
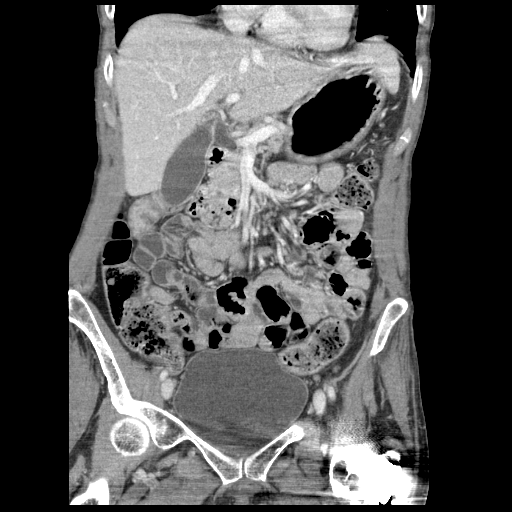
[im 35/78  soft-tissue]
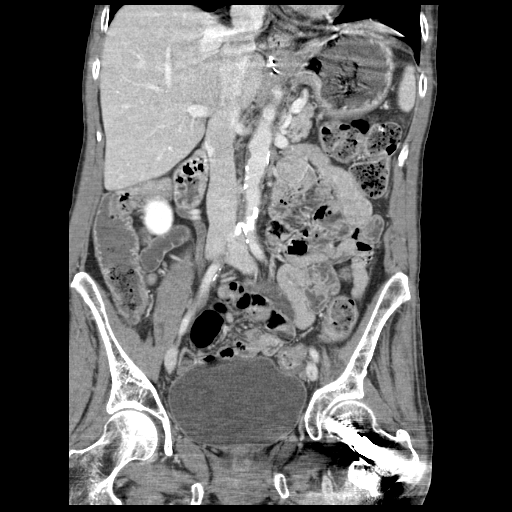
[im 43/78  soft-tissue]
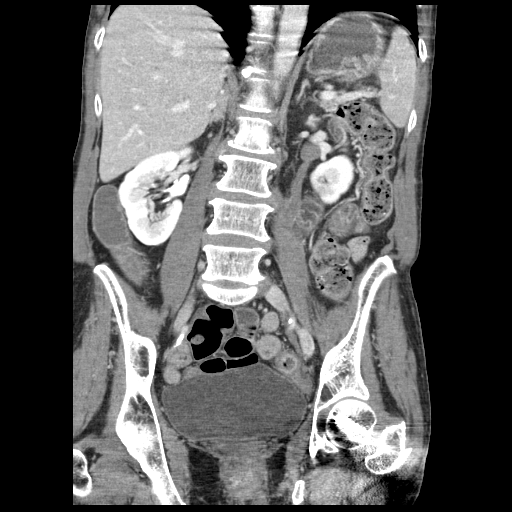

[16 of 46 positions shown; findings below may reference images not displayed]

FINDINGS: There is a well circumscribed low attenuation post
ablation defect of the medial lower pole left kidney.  This
measures approximately 2 x 2.7 x 2 cm.  This does encompass the
previously treated solid tumor.  There currently is no convincing
evidence of enhancing tumor tissue after ablation.

There is no evidence of metastatic disease in the abdomen.  No
enlarged lymph nodes are identified.  Unenhanced bowel loops show
no obstruction or inflammation in the abdomen.  No fluid
collections.
IMPRESSION: Expected post ablation defect of the lower pole of the left kidney
which corresponds to the ablated carcinoma.  No current evidence of
enhancing residual tumor after ablation.

CT PELVIS
FINDINGS: The patient was not given oral contrast.  There is
suggestion of potential thickening of the sigmoid colon with a
small amount of adjacent fluid.  Multiple diverticula are also seen
emanating from the sigmoid colon.  Findings may be the residua of
chronic diverticular disease.  However, with symptoms of left lower
quadrant pain, more active diverticulitis or colitis is not
excluded.

No evidence of focal abscess, significant free fluid in the pelvis
or bowel obstruction.  The bladder is distended.  No hernias are
present.  The patient has had prior hysterectomy.  Metal artifact
is seen from a left hip compression screw.
IMPRESSION: Suggestion of thickening of the sigmoid colon in the setting of
diverticulosis.  There also is a small amount of pericolonic fluid
in this region.  Findings are suggestive of diverticular disease
versus colitis.  Component of acute diverticulitis is not excluded.
However, no discrete abscess is seen and the sigmoid inflammatory
changes are more diffuse rather than focal.

## 2009-11-14 IMAGING — RF IR FLUORO GUIDE CV LINE*R*
1 series · 1 of 1 positions shown · IV contrast (agent unspecified)
Comparison: none

CLINICAL DATA: Need for IV access prior to CT with IV contrast.
The patient has known poor IV access and has required PICC line in
the past for contrast administration.

UPPER EXTREMITY PICC PLACEMENT WITH ULTRASOUND AND FLUORO GUIDANCE
TECHNIQUE: The right arm was prepped with chlorhexidine, draped in
the usual sterile fashion using maximum barrier technique and
infiltrated locally with 1% Lidocaine.  Ultrasound demonstrated
patency of the right brachial vein.  Under real-time ultrasound
guidance, this vein was accessed with a 21 gauge micropuncture
needle.  Ultrasound image documentation was performed.  The needle
was exchanged over a guidewire for a peel-away sheath through which
a five French single lumen PICC trimmed to 15cm was advanced,
positioned with its tip in the right subclavian vein.  Fluoroscopy
during the procedure and fluoro spot radiograph confirms
appropriate catheter position.  The catheter was flushed, secured
to the skin and covered with a sterile dressing.  No immediate
complication.
Fluoroscopy Time: 0.5 minutes

[Series 1: run · 1 of 1 slices shown]
[im 1/1]
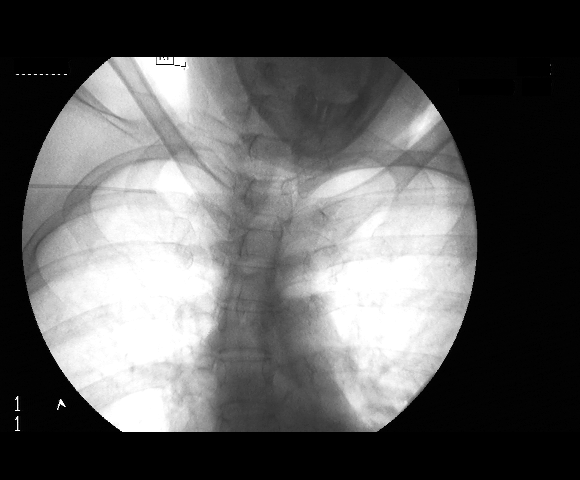

[1 of 1 positions shown; findings below may reference images not displayed]

IMPRESSION: Technically successful right arm PICC placement with ultrasound and
fluoroscopic guidance.  The catheter is ready for use.

## 2009-11-14 IMAGING — US IR US GUIDE VASC ACCESS RIGHT
1 series · 1 of 1 positions shown · IV contrast (agent unspecified)
Comparison: none

CLINICAL DATA: Need for IV access prior to CT with IV contrast.
The patient has known poor IV access and has required PICC line in
the past for contrast administration.

UPPER EXTREMITY PICC PLACEMENT WITH ULTRASOUND AND FLUORO GUIDANCE
TECHNIQUE: The right arm was prepped with chlorhexidine, draped in
the usual sterile fashion using maximum barrier technique and
infiltrated locally with 1% Lidocaine.  Ultrasound demonstrated
patency of the right brachial vein.  Under real-time ultrasound
guidance, this vein was accessed with a 21 gauge micropuncture
needle.  Ultrasound image documentation was performed.  The needle
was exchanged over a guidewire for a peel-away sheath through which
a five French single lumen PICC trimmed to 15cm was advanced,
positioned with its tip in the right subclavian vein.  Fluoroscopy
during the procedure and fluoro spot radiograph confirms
appropriate catheter position.  The catheter was flushed, secured
to the skin and covered with a sterile dressing.  No immediate
complication.
Fluoroscopy Time: 0.5 minutes

[Series 1: ir us guide vasc access right · 0.06mm/px · 1 of 1 slices shown]
[im 1/1]
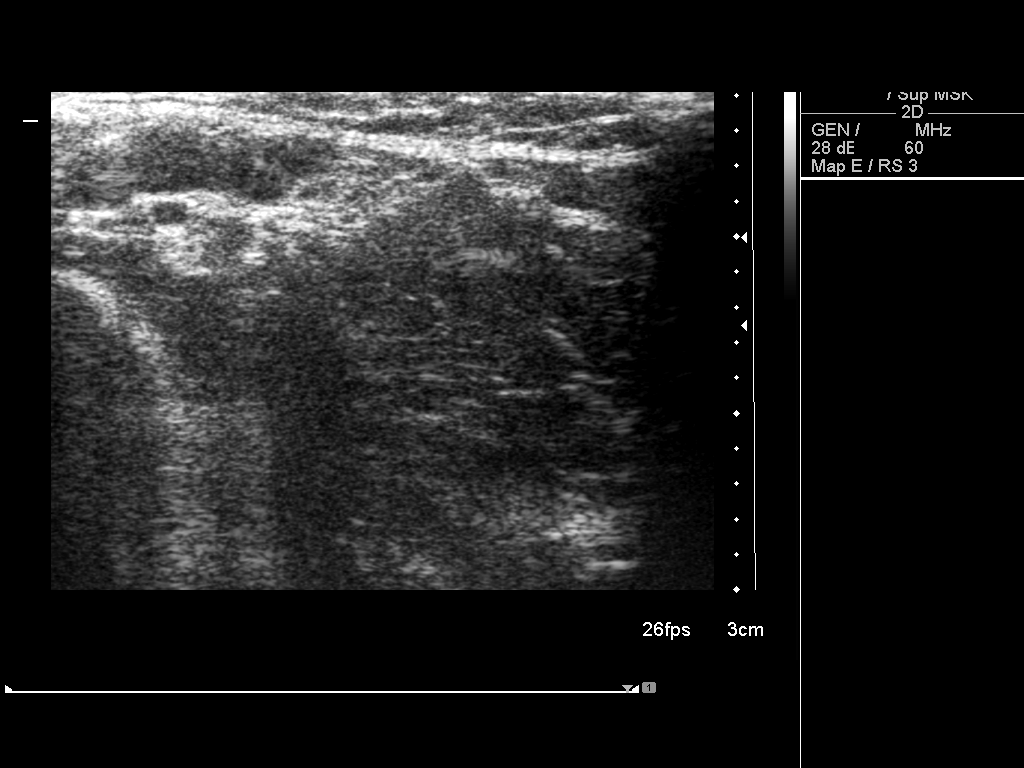

[1 of 1 positions shown; findings below may reference images not displayed]

IMPRESSION: Technically successful right arm PICC placement with ultrasound and
fluoroscopic guidance.  The catheter is ready for use.

## 2009-11-20 IMAGING — CR DG ABDOMEN ACUTE W/ 1V CHEST
3 series · 3 of 3 positions shown · non-contrast
Comparison: 11/19/2006

CLINICAL DATA: Abdominal pain

ACUTE ABDOMEN SERIES (ABDOMEN 2 VIEW & CHEST 1 VIEW)

[w chest pa]
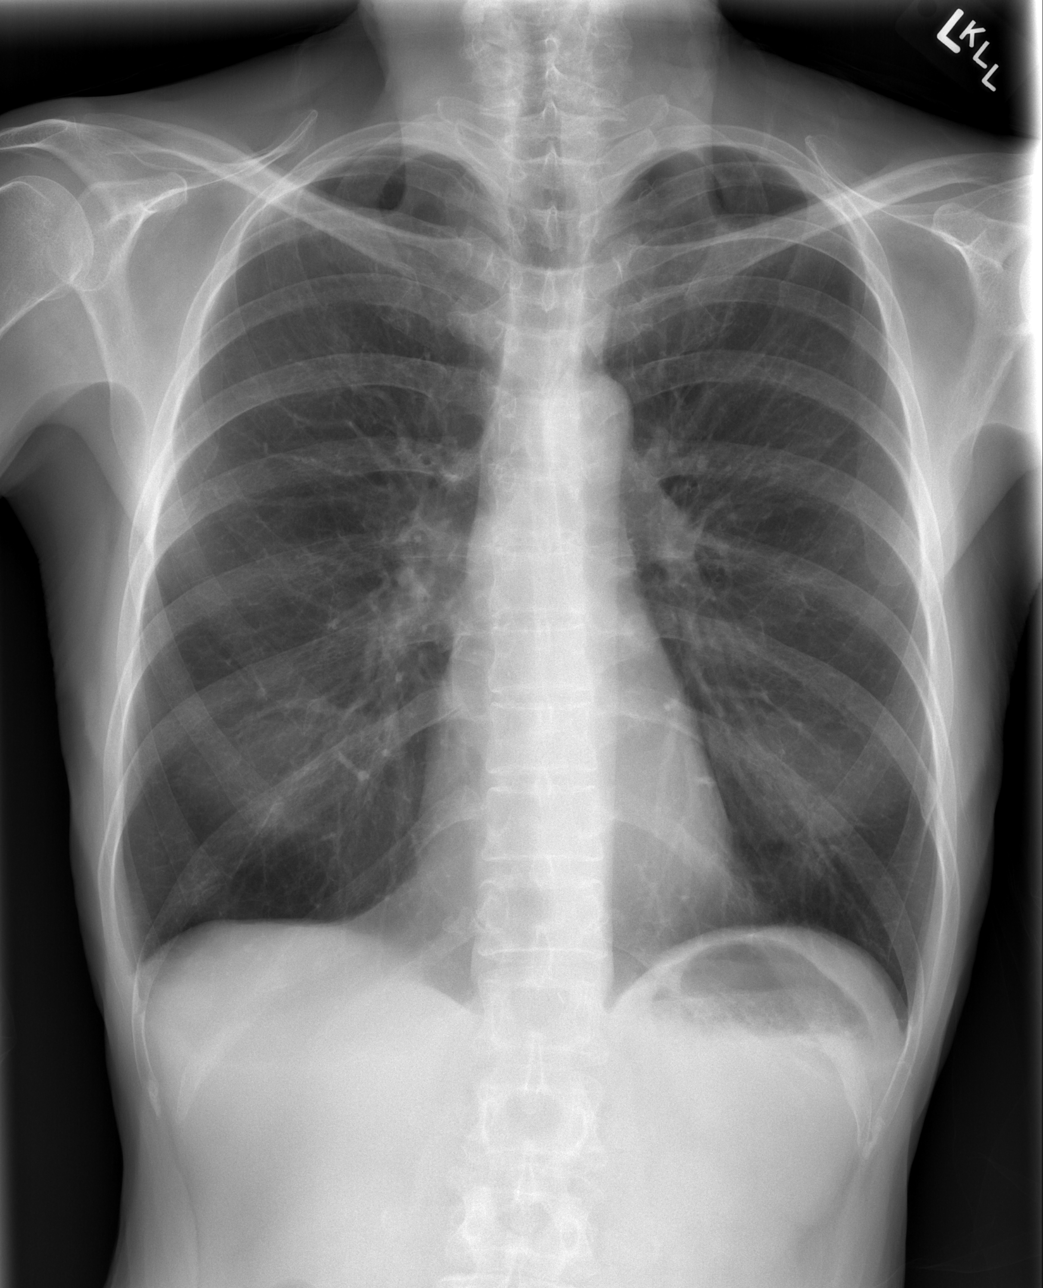

[w abdomen upright *]
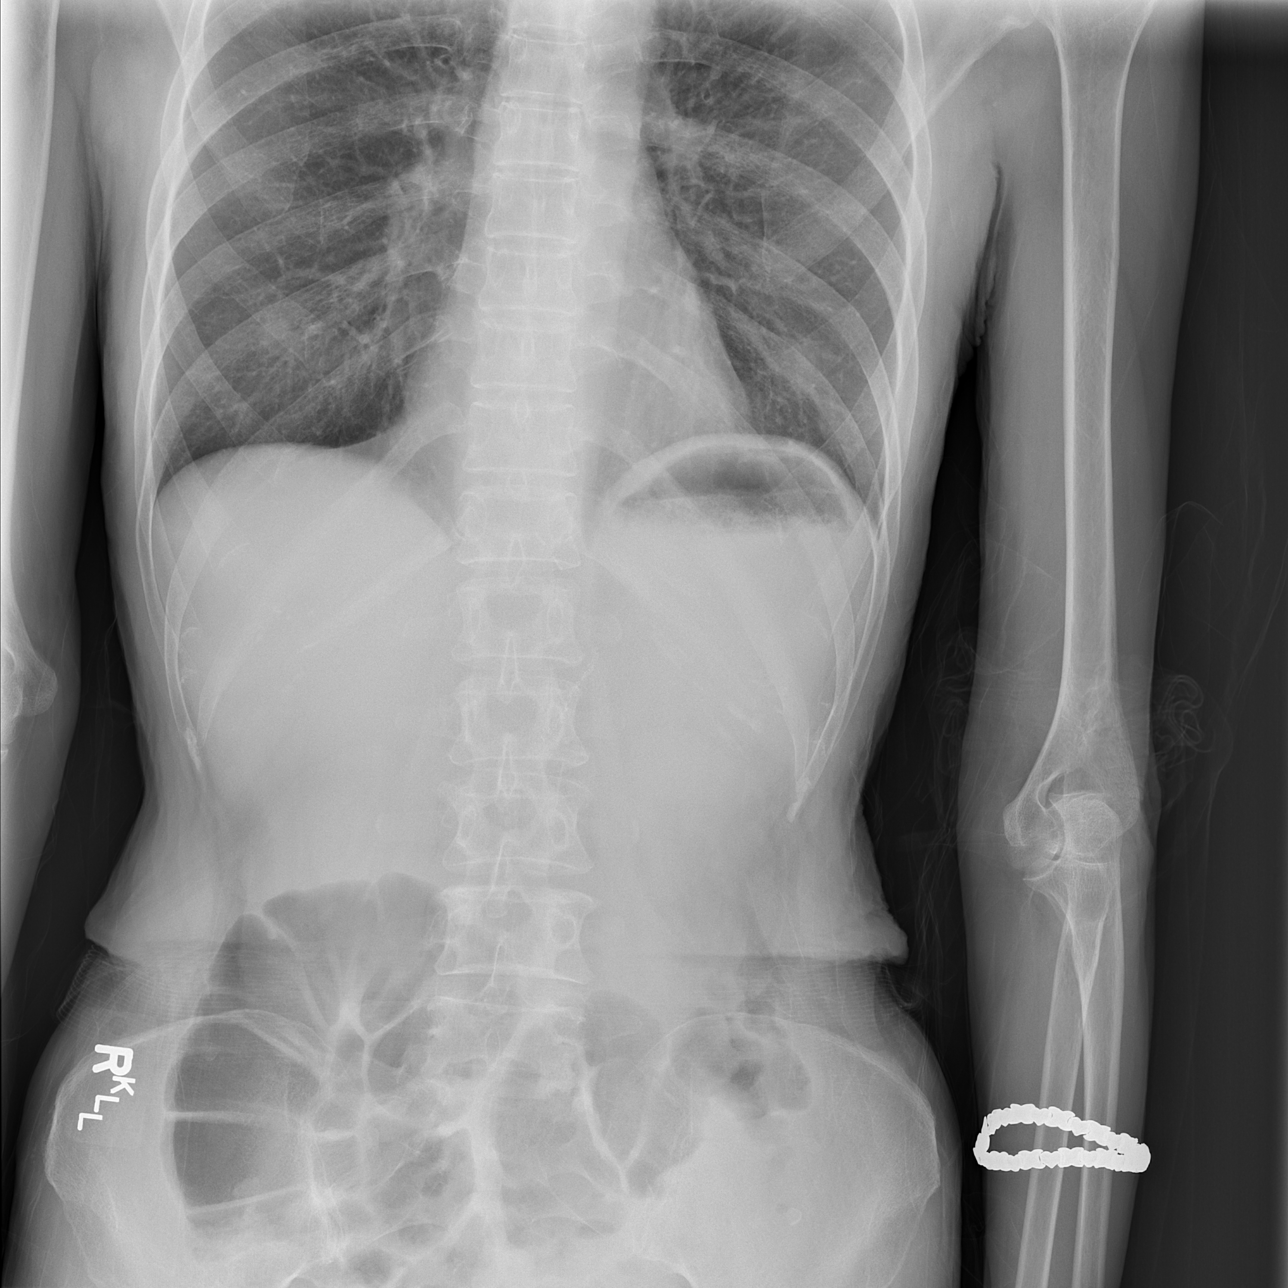

[t abdomen supine]
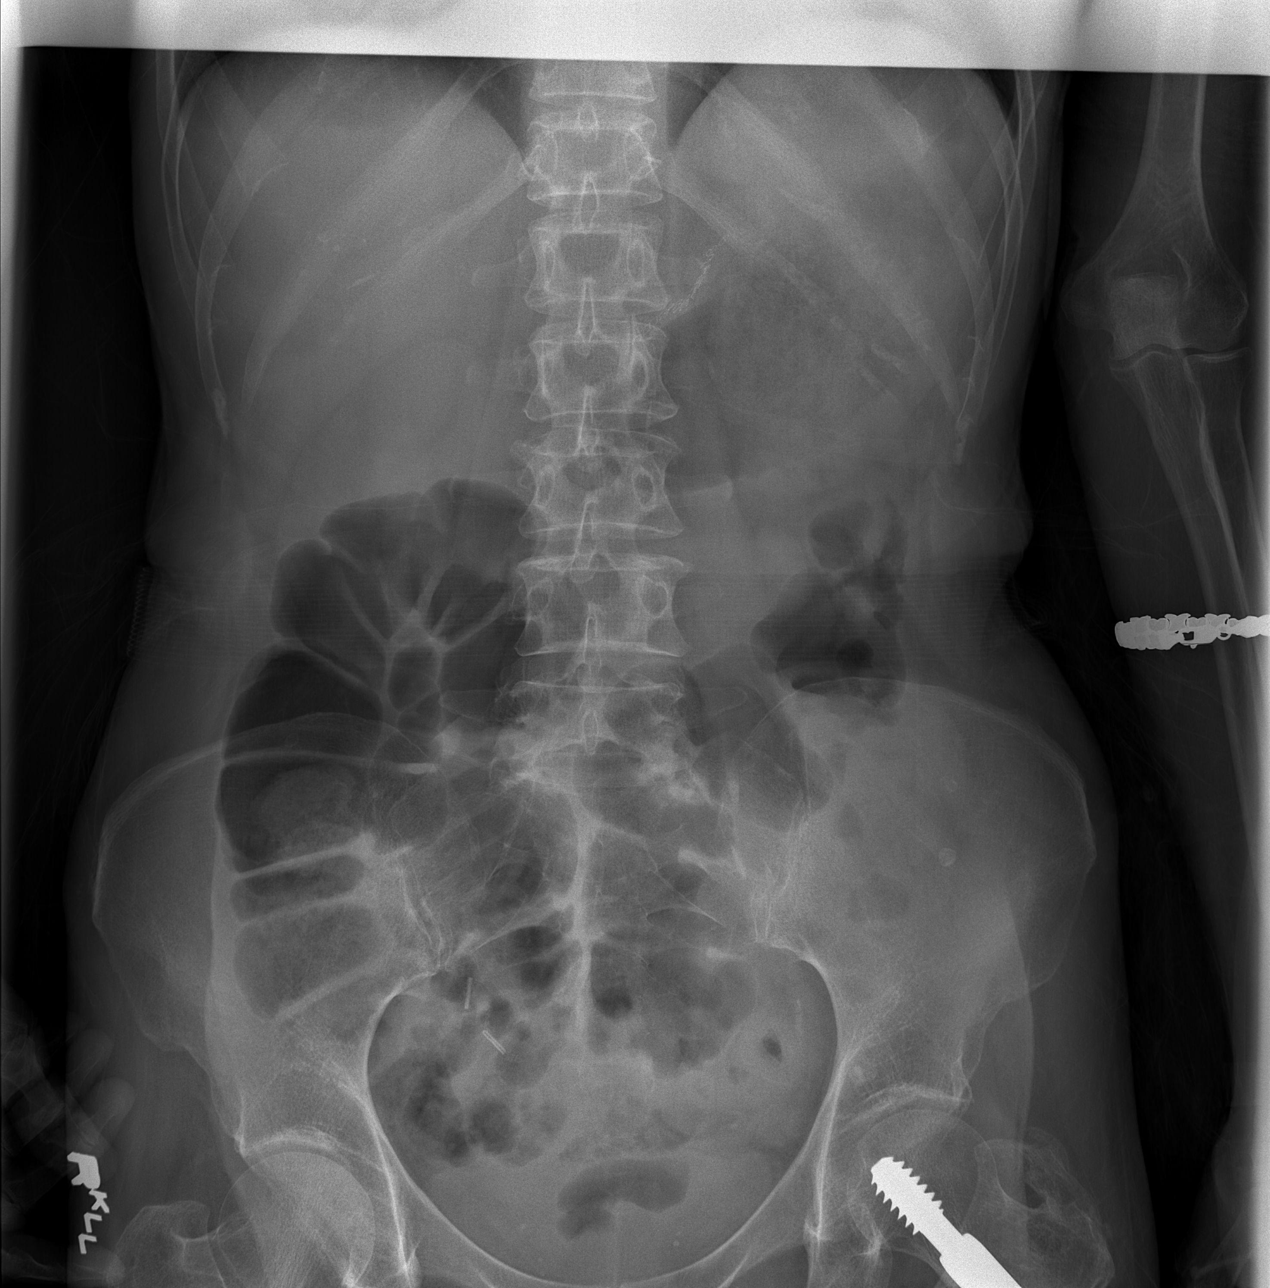

[3 of 3 positions shown; findings below may reference images not displayed]

FINDINGS: The chest radiograph again demonstrates hyperinflation of
the lungs without focal disease.  The heart and mediastinum are
stable.  There is an air-fluid level within the stomach.  There are
surgical bowel clips in the upper abdomen.  The patient has had
fixation in the left hip.  There are also surgical clips in the
right lower quadrant of the abdomen.  No evidence of free air.
Probable bilateral nipple shadows overlying the lung bases.  Gas-
filled loops of bowel in the mid and right abdomen.  These loops of
bowel probably represent colon but this is not certain.
IMPRESSION: Gas filled loops of bowel in the abdomen.  Difficult to exclude
small bowel dilatation.  This could be better evaluated with CT.

## 2009-12-08 ENCOUNTER — Emergency Department (HOSPITAL_COMMUNITY)
Admission: EM | Admit: 2009-12-08 | Discharge: 2009-12-08 | Payer: Self-pay | Source: Home / Self Care | Admitting: Emergency Medicine

## 2009-12-11 ENCOUNTER — Inpatient Hospital Stay (HOSPITAL_COMMUNITY): Admission: EM | Admit: 2009-12-11 | Discharge: 2009-02-24 | Payer: Self-pay | Admitting: Emergency Medicine

## 2009-12-21 ENCOUNTER — Emergency Department (HOSPITAL_COMMUNITY)
Admission: EM | Admit: 2009-12-21 | Discharge: 2009-12-21 | Payer: Self-pay | Source: Home / Self Care | Admitting: Emergency Medicine

## 2010-01-25 ENCOUNTER — Encounter: Payer: Self-pay | Admitting: Interventional Radiology

## 2010-01-25 ENCOUNTER — Encounter: Payer: Self-pay | Admitting: *Deleted

## 2010-01-26 ENCOUNTER — Encounter: Payer: Self-pay | Admitting: Emergency Medicine

## 2010-02-11 ENCOUNTER — Other Ambulatory Visit (HOSPITAL_COMMUNITY): Payer: Self-pay | Admitting: Interventional Radiology

## 2010-02-11 ENCOUNTER — Other Ambulatory Visit: Payer: Self-pay | Admitting: Interventional Radiology

## 2010-02-11 DIAGNOSIS — N2889 Other specified disorders of kidney and ureter: Secondary | ICD-10-CM

## 2010-03-05 IMAGING — CR DG CHEST 1V PORT
1 series · 1 of 1 positions shown · non-contrast
Comparison: 03/12/2007

CLINICAL DATA: Pancreatitis.  Short of breath.

PORTABLE CHEST - 1 VIEW

[view not recorded]
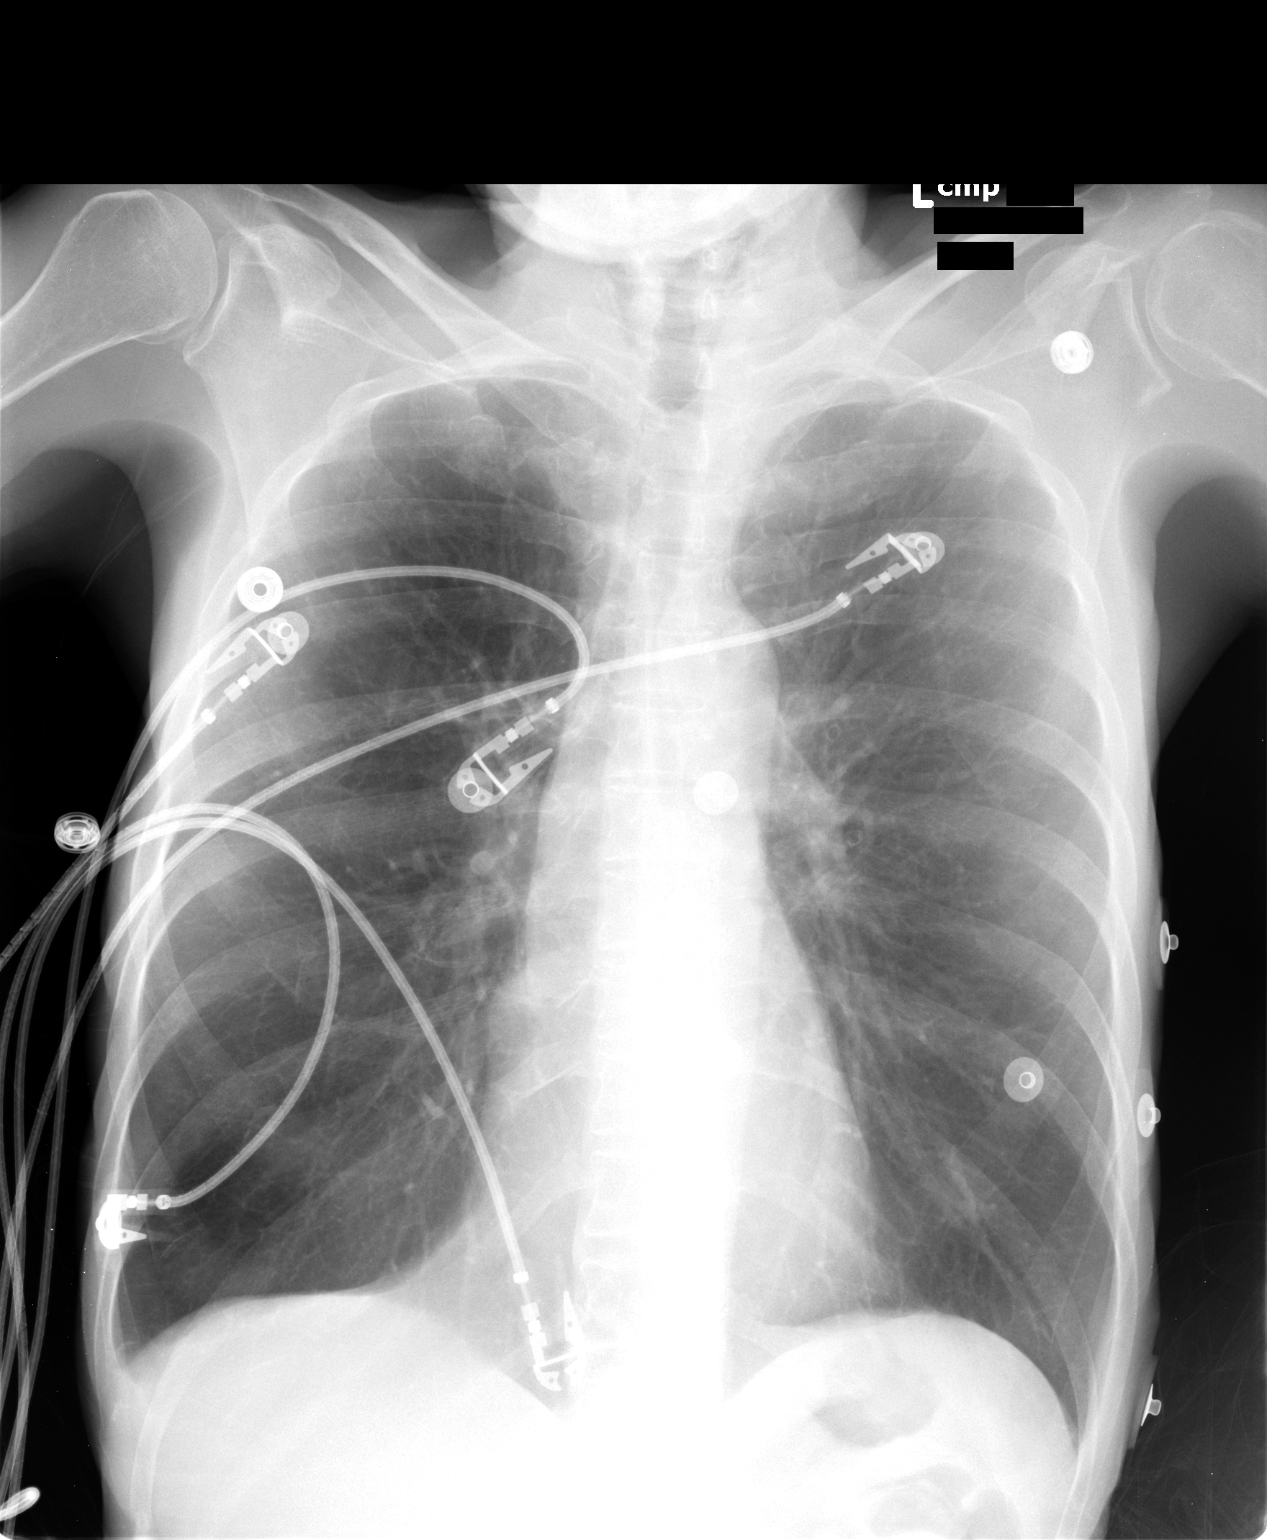

[1 of 1 positions shown; findings below may reference images not displayed]

FINDINGS: The lungs are hyperinflated with changes of severe
emphysema.  The lungs are clear without infiltrate or effusion.
The vascularity is normal.
IMPRESSION: Severe COPD.  No active cardiopulmonary disease.

## 2010-03-05 IMAGING — XA IR US GUIDE VASC ACCESS LEFT
1 series · 4 of 4 positions shown · non-contrast
Comparison: none

08/25/07 - DUPLICATE COPY for exam association in [facility] – No change from original report.
CLINICAL DATA: Pancreatitis; central venous access is requested for
 fluids and medications.

 LEFT UPPER EXTREMITY PICC PLACEMENT WITH ULTRASOUND AND FLUORO
 GUIDANCE
TECHNIQUE: The left arm was prepped with chlorhexidine, draped in
 the usual sterile fashion using maximum barrier technique and
 infiltrated locally with 1% Lidocaine. Ultrasound demonstrated
 patency of the left brachial vein. Under real-time ultrasound
 guidance, this vein was accessed with a 21 gauge micropuncture
 needle. Ultrasound image documentation was performed. The needle
 was exchanged over a guidewire for a peel-away sheath through which
 a 5 French dual lumen PICC trimmed to 42cm was advanced, positioned
 with its tip at the distal SVC/right atrial junction. Approximately
 5 cc's of contrast were utilized for left upper extremity
 venography secondary to difficulty advancing the PICC line beyond
 the left axillary vein region. This demonstrated a patent but
 tortuous axillary vein. Following guide wire and PICC line
 manipulation, the tip of the PICC was advanced into the SVC/ right
 atrial junction. Fluoroscopy during the procedure and fluoro spot
 radiograph confirms appropriate catheter position. The catheter
 was flushed, secured to the skin with Prolene sutures, and covered
 with a sterile dressing. No immediate complication.
 Fluoroscopy Time: 6.5 minutes.

[Series 1: run · 4 of 4 slices shown]
[im 1/4]
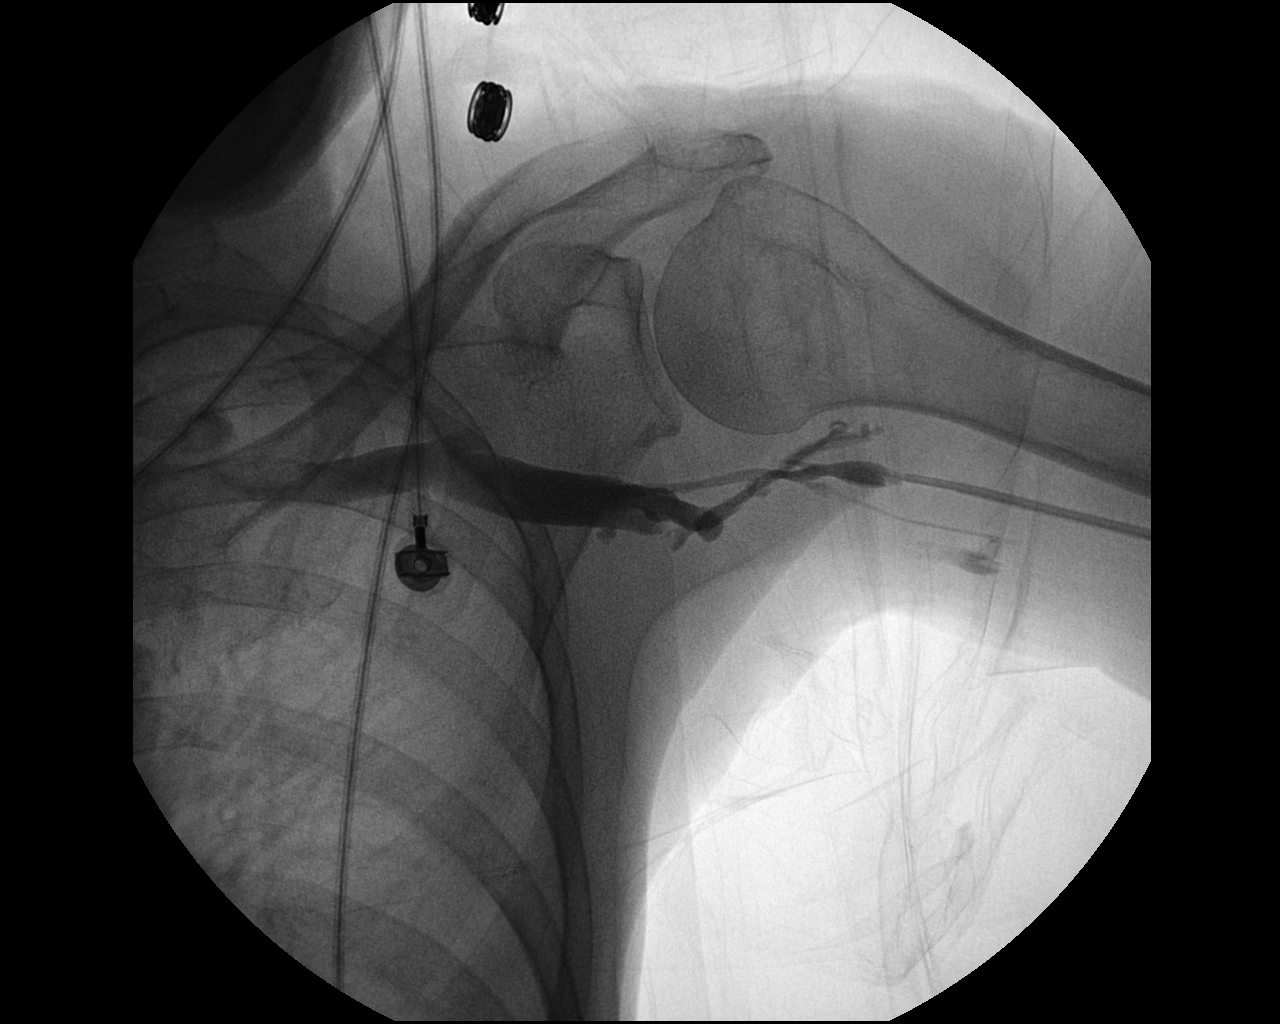
[im 2/4]
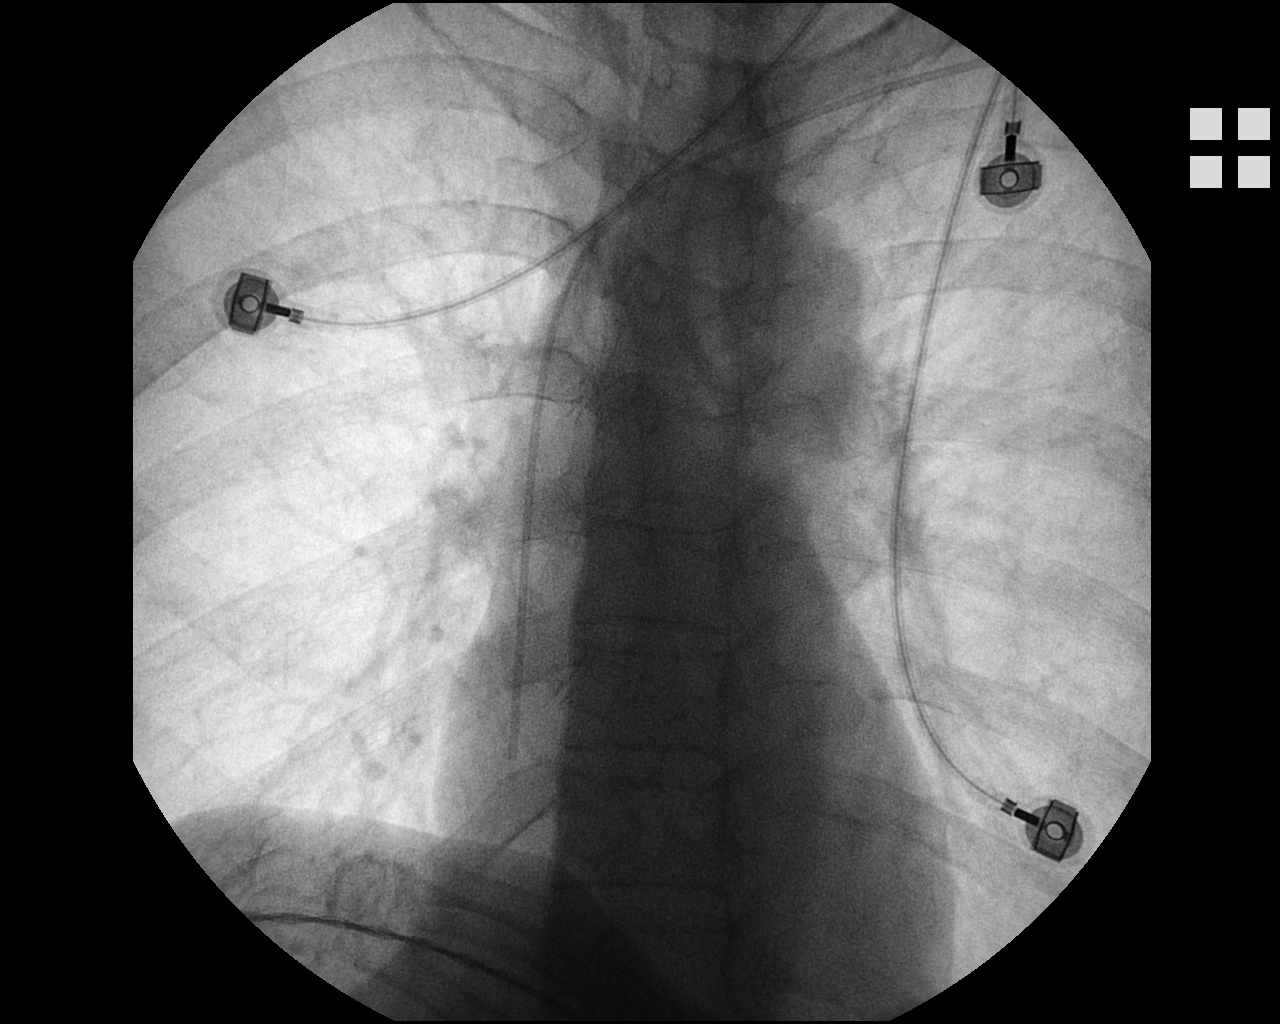
[im 3/4]
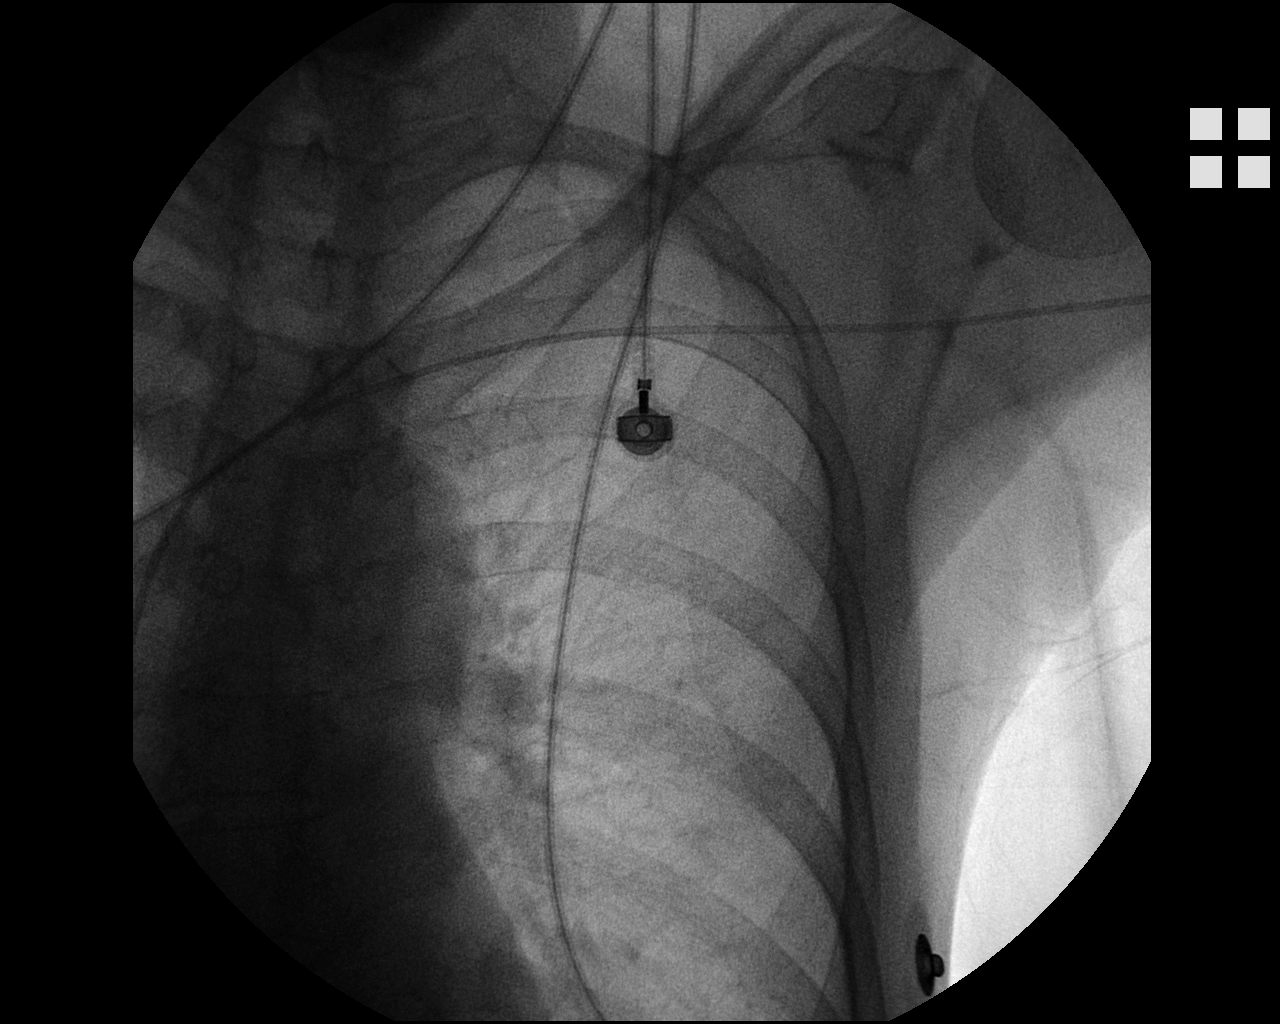
[im 4/4]
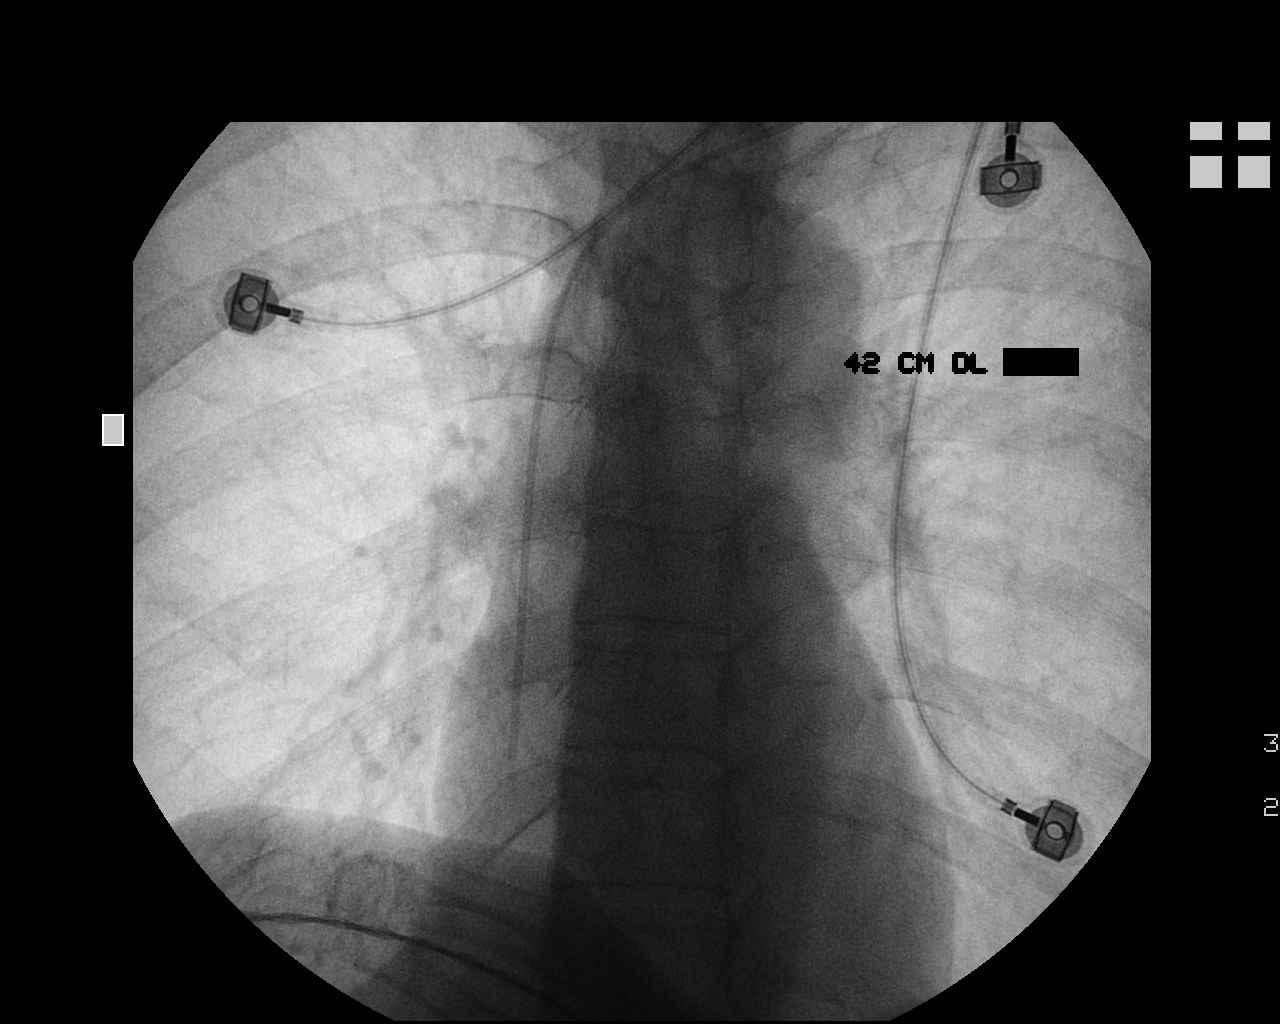

[4 of 4 positions shown; findings below may reference images not displayed]

IMPRESSION: Technically successful left arm PICC placement with ultrasound and
 fluoroscopic guidance. The catheter is ready for use.

 Read by: Minaj, Kreku.-HJALTI

## 2010-03-06 IMAGING — CT CT PELVIS W/ CM
2 of 5 series · 11 of 36 positions shown, 18 images · IV contrast (water/omni  & 80 ml omni 300)
Comparison: Acute abdomen series 05/08/2007.  CT of 05/02/2007.

CT ABDOMEN

CLINICAL DATA: UPPER ABDOMINAL PAIN.  HISTORY OF CHRONIC
PANCREATITIS.  COPD.  KIDNEY CANCER.

CT ABDOMEN AND PELVIS WITH CONTRAST
TECHNIQUE: Multidetector CT imaging of the abdomen and pelvis was
performed following the standard protocol following the bolus
administration of intravenous contrast.
Contrast: 80 ml Vmnipaque-9DD

[Series 2: routine abdomen · axial · 0.67mm/px · z∈[-494,-134]mm · 10 of 88 slices shown, 16 images]
[im 8/88  soft-tissue]
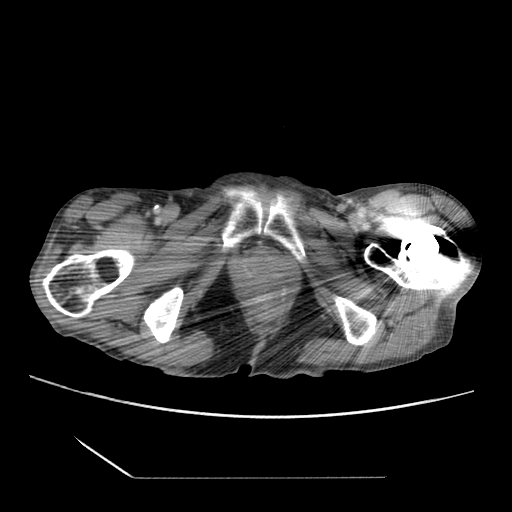
[im 8/88  bone]
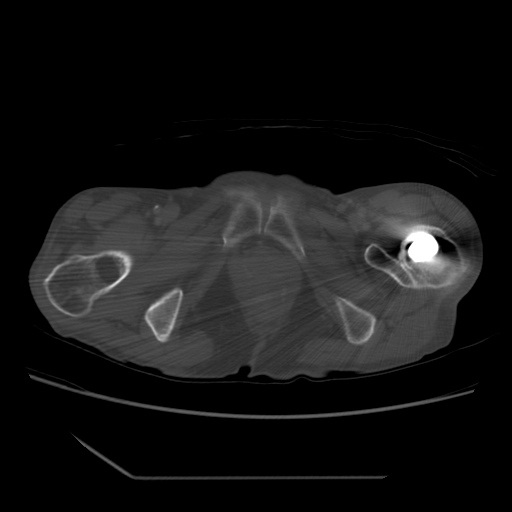
[im 15/88  soft-tissue]
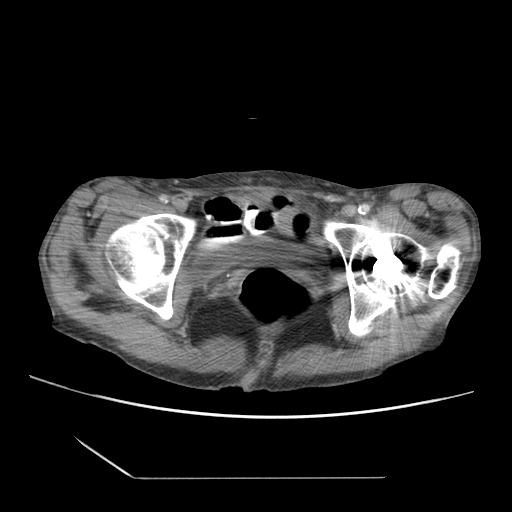
[im 22/88  soft-tissue]
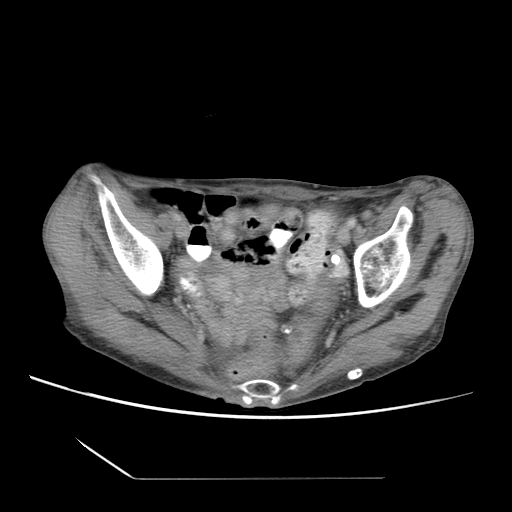
[im 30/88  soft-tissue]
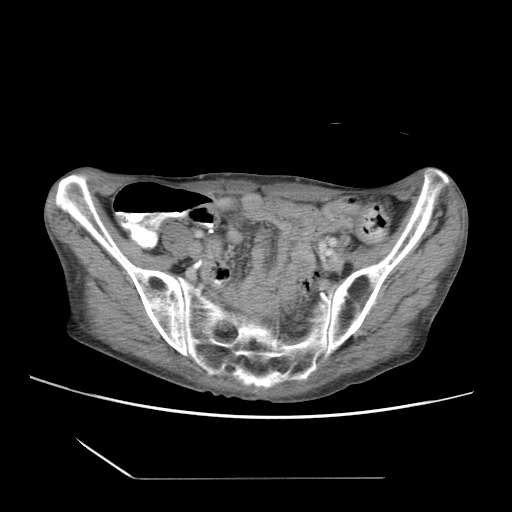
[im 37/88  soft-tissue]
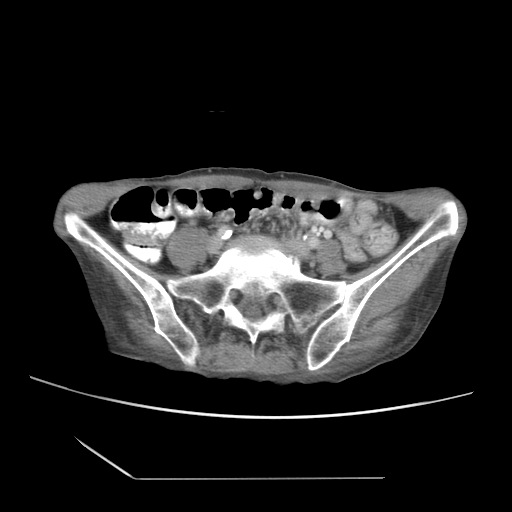
[im 51/88  soft-tissue]
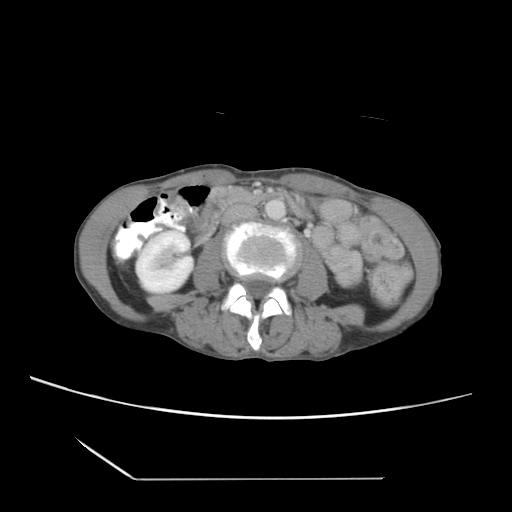
[im 59/88  soft-tissue]
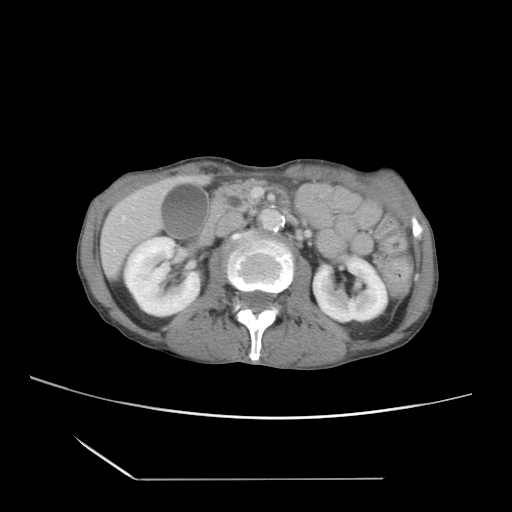
[im 59/88  lung]
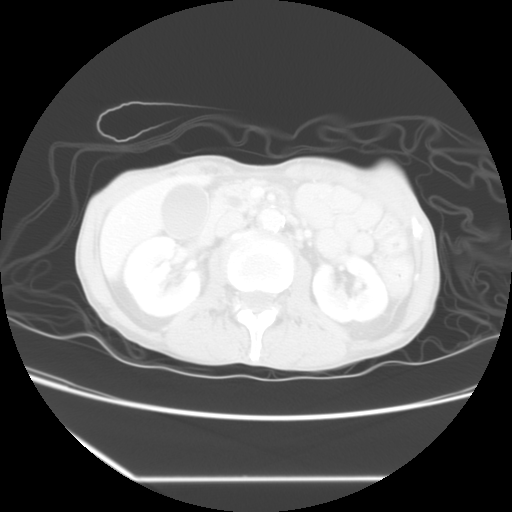
[im 66/88  soft-tissue]
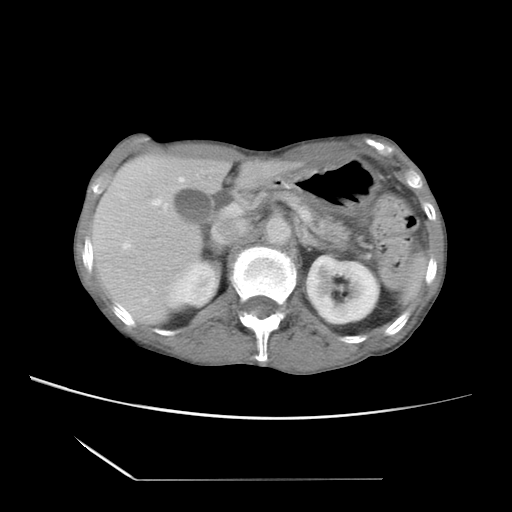
[im 66/88  lung]
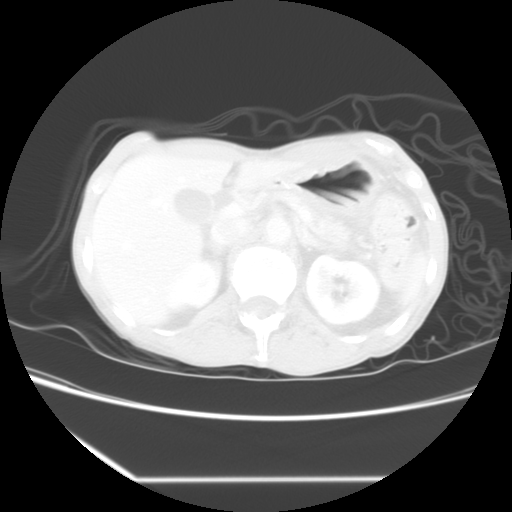
[im 73/88  soft-tissue]
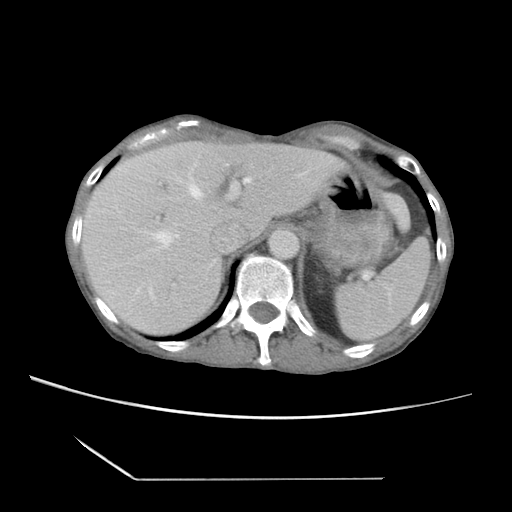
[im 73/88  lung]
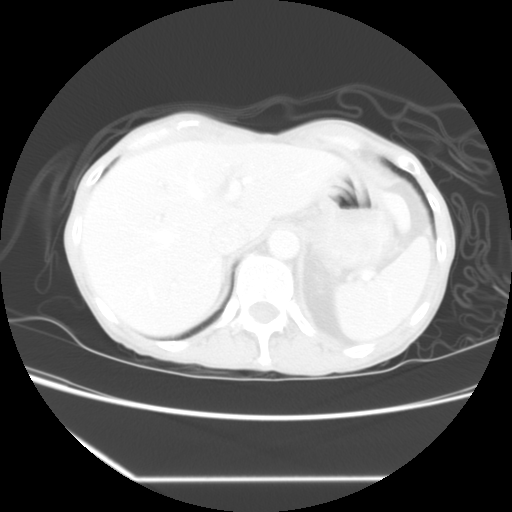
[im 73/88  bone]
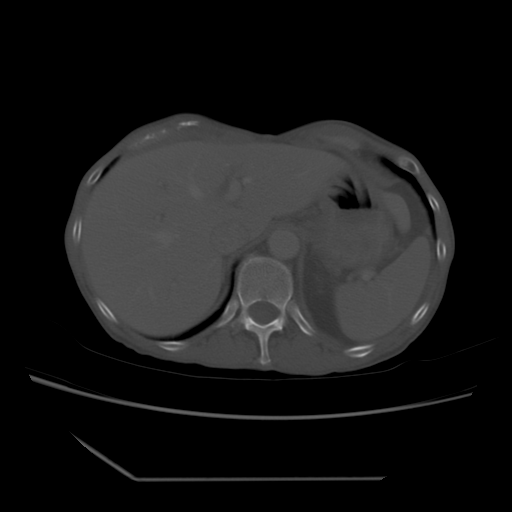
[im 80/88  soft-tissue]
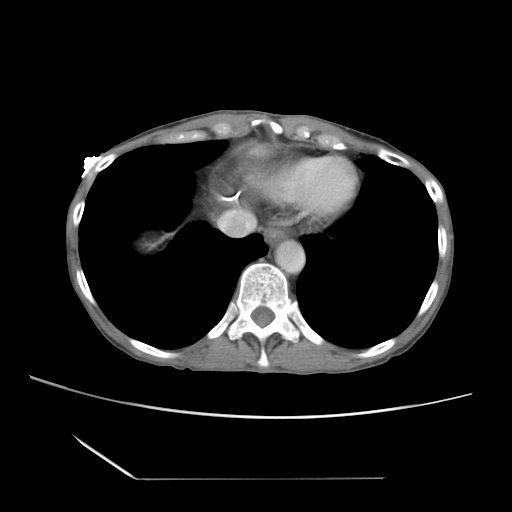
[im 80/88  lung]
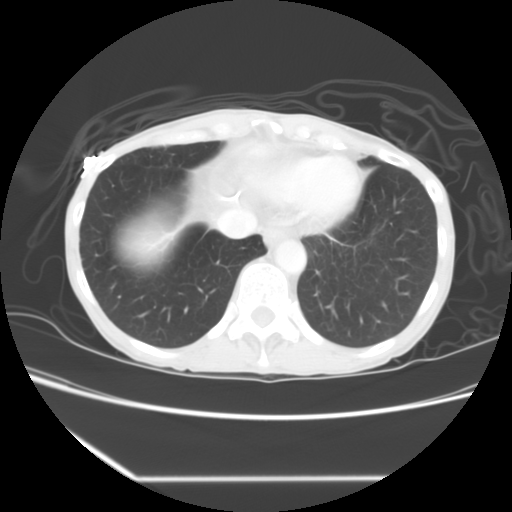

[Series 400: reformatted · sagittal · 0.84mm/px · 1 of 99 slices shown, 2 images]
[im 33/99  soft-tissue]
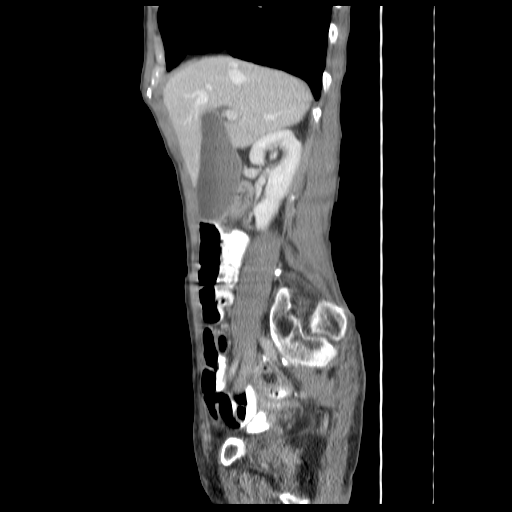
[im 33/99  bone]
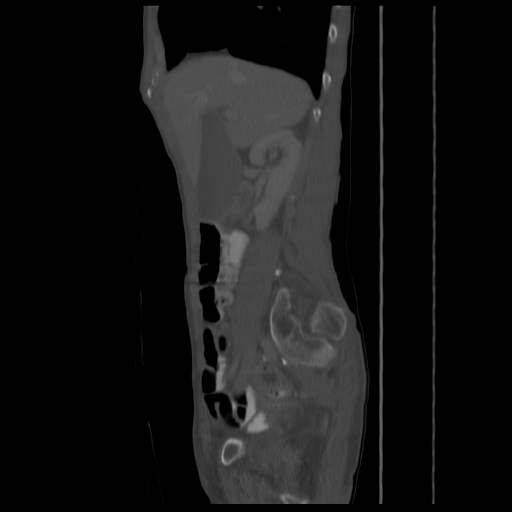

[11 of 36 positions shown; findings below may reference images not displayed]

FINDINGS: Clear lung bases.  Normal heart size without pericardial
or pleural effusion.

Normal liver, spleen, stomach.  Mild pancreatic atrophy.  No focal
pancreatic abnormality,  evidence of acute pancreatitis nor its
complications.

Normal gallbladder.  Borderline intrahepatic biliary ductal
dilatation.  Similar over prior exam.  Common duct at 1.0 cm
tapering to approximately 7 mm in its pancreatic portion.  Slightly
increased from maximally 8 mm on prior exam.  No pancreatic ductal
dilatation.

Normal adrenal glands and right kidney.  Left lower pole
radiofrequency ablation defect.  Area of subtle increased density
in its medial inferior portion is felt to be similar to the
precontrast CT of 05/02/2007 and likely relates to small hemorrhage
or proteinaceous debris.  Image 36 series 2. No retroperitoneal or
retrocrural adenopathy. Normal colon and terminal ileum.  Appendix
is not visualized but there is no evidence of right lower quadrant
inflammation.
IMPRESSION: 1.  No acute process in the abdomen.
2.  Borderline intrahepatic and mild extrahepatic biliary ductal
dilatation.  Suspect slight progression since prior exam.
Correlate with bilirubin levels.  If these are elevated, consider
further evaluation with ERCP or MRCP.
3.  Left renal R F A defect.  Probable hemorrhage or proteinaceous
debris posteriorly. Recommend attention on follow-up.
4.  No evidence of metastatic disease.

CT PELVIS
FINDINGS: Normal pelvic bowel loops.  No pelvic adenopathy.
Trace free pelvic fluid is likely physiologic.  Normal urinary
bladder.  Hysterectomy.  No adnexal mass.  Left hip pinning.  Mild
osteopenia.
IMPRESSION: 1.  Hysterectomy but no acute pelvic process.

## 2010-03-10 ENCOUNTER — Ambulatory Visit: Admission: RE | Admit: 2010-03-10 | Payer: Self-pay | Source: Ambulatory Visit

## 2010-03-10 ENCOUNTER — Ambulatory Visit (HOSPITAL_COMMUNITY)
Admission: RE | Admit: 2010-03-10 | Discharge: 2010-03-10 | Disposition: A | Discharge status: Home / Self Care | Payer: Medicaid Other | Source: Ambulatory Visit | Attending: Interventional Radiology | Admitting: Interventional Radiology

## 2010-03-10 DIAGNOSIS — K862 Cyst of pancreas: Secondary | ICD-10-CM | POA: Insufficient documentation

## 2010-03-10 DIAGNOSIS — N2889 Other specified disorders of kidney and ureter: Secondary | ICD-10-CM

## 2010-03-10 DIAGNOSIS — C649 Malignant neoplasm of unspecified kidney, except renal pelvis: Secondary | ICD-10-CM | POA: Insufficient documentation

## 2010-03-10 DIAGNOSIS — R109 Unspecified abdominal pain: Secondary | ICD-10-CM | POA: Insufficient documentation

## 2010-03-10 DIAGNOSIS — N289 Disorder of kidney and ureter, unspecified: Secondary | ICD-10-CM | POA: Insufficient documentation

## 2010-03-10 LAB — CREATININE, SERUM: Creatinine, Ser: 0.87 mg/dL (ref 0.4–1.2)

## 2010-03-10 MED ORDER — IOHEXOL 300 MG/ML  SOLN
100.0000 mL | Freq: Once | INTRAMUSCULAR | Status: AC | PRN
  Administered 2010-03-10: 100 mL via INTRAVENOUS

## 2010-03-16 LAB — COMPREHENSIVE METABOLIC PANEL
Albumin: 3.4 g/dL — ABNORMAL LOW (ref 3.5–5.2)
Alkaline Phosphatase: 81 U/L (ref 39–117)
BUN: 8 mg/dL (ref 6–23)
Creatinine, Ser: 0.87 mg/dL (ref 0.4–1.2)
Glucose, Bld: 79 mg/dL (ref 70–99)
Potassium: 4.1 mEq/L (ref 3.5–5.1)
Total Bilirubin: 0.1 mg/dL — ABNORMAL LOW (ref 0.3–1.2)
Total Protein: 6.3 g/dL (ref 6.0–8.3)

## 2010-03-16 LAB — RAPID URINE DRUG SCREEN, HOSP PERFORMED
Benzodiazepines: POSITIVE — AB
Cocaine: NOT DETECTED
Opiates: POSITIVE — AB
Tetrahydrocannabinol: NOT DETECTED

## 2010-03-16 LAB — LIPASE, BLOOD: Lipase: 21 U/L (ref 11–59)

## 2010-03-16 LAB — ETHANOL: Alcohol, Ethyl (B): 207 mg/dL — ABNORMAL HIGH (ref 0–10)

## 2010-03-17 LAB — URINE MICROSCOPIC-ADD ON

## 2010-03-17 LAB — URINALYSIS, ROUTINE W REFLEX MICROSCOPIC
Leukocytes, UA: NEGATIVE
Nitrite: NEGATIVE
Protein, ur: NEGATIVE mg/dL
Specific Gravity, Urine: 1.004 — ABNORMAL LOW (ref 1.005–1.030)
Urobilinogen, UA: 0.2 mg/dL (ref 0.0–1.0)

## 2010-03-17 LAB — URINE CULTURE

## 2010-03-18 LAB — CBC
HCT: 42.8 % (ref 36.0–46.0)
MCV: 97.1 fL (ref 78.0–100.0)
Platelets: 186 10*3/uL (ref 150–400)
RBC: 4.41 MIL/uL (ref 3.87–5.11)
RDW: 14.5 % (ref 11.5–15.5)
WBC: 5.4 10*3/uL (ref 4.0–10.5)

## 2010-03-18 LAB — URINALYSIS, ROUTINE W REFLEX MICROSCOPIC
Glucose, UA: NEGATIVE mg/dL
Ketones, ur: NEGATIVE mg/dL
Nitrite: NEGATIVE
pH: 6 (ref 5.0–8.0)

## 2010-03-18 LAB — DIFFERENTIAL
Basophils Absolute: 0 10*3/uL (ref 0.0–0.1)
Basophils Relative: 0 % (ref 0–1)
Eosinophils Absolute: 0.1 10*3/uL (ref 0.0–0.7)
Eosinophils Relative: 2 % (ref 0–5)
Monocytes Absolute: 0.4 10*3/uL (ref 0.1–1.0)
Monocytes Relative: 7 % (ref 3–12)

## 2010-03-18 LAB — COMPREHENSIVE METABOLIC PANEL
ALT: 15 U/L (ref 0–35)
Albumin: 4 g/dL (ref 3.5–5.2)
Alkaline Phosphatase: 110 U/L (ref 39–117)
Chloride: 108 mEq/L (ref 96–112)
Potassium: 4.1 mEq/L (ref 3.5–5.1)
Sodium: 144 mEq/L (ref 135–145)
Total Bilirubin: 0.3 mg/dL (ref 0.3–1.2)
Total Protein: 7.7 g/dL (ref 6.0–8.3)

## 2010-03-18 LAB — URINE MICROSCOPIC-ADD ON

## 2010-03-18 LAB — URINE CULTURE
Colony Count: NO GROWTH
Culture: NO GROWTH

## 2010-03-18 LAB — ETHANOL: Alcohol, Ethyl (B): 145 mg/dL — ABNORMAL HIGH (ref 0–10)

## 2010-03-23 LAB — COMPREHENSIVE METABOLIC PANEL
ALT: 16 U/L (ref 0–35)
BUN: 11 mg/dL (ref 6–23)
CO2: 24 mEq/L (ref 19–32)
Calcium: 9.5 mg/dL (ref 8.4–10.5)
GFR calc non Af Amer: >60 mL/min (ref >60)
Glucose, Bld: 122 mg/dL — ABNORMAL HIGH (ref 70–99)
Sodium: 140 mEq/L (ref 135–145)

## 2010-03-23 LAB — DIFFERENTIAL
Basophils Relative: 0 % (ref 0–1)
Eosinophils Absolute: 0 10*3/uL (ref 0.0–0.7)
Lymphs Abs: 1 10*3/uL (ref 0.7–4.0)
Neutro Abs: 5.9 10*3/uL (ref 1.7–7.7)
Neutrophils Relative %: 80 % — ABNORMAL HIGH (ref 43–77)

## 2010-03-23 LAB — CBC
HCT: 40.3 % (ref 36.0–46.0)
Hemoglobin: 13.4 g/dL (ref 12.0–15.0)
MCHC: 33.3 g/dL (ref 30.0–36.0)
MCV: 93.2 fL (ref 78.0–100.0)
RBC: 4.33 MIL/uL (ref 3.87–5.11)

## 2010-03-23 LAB — LIPASE, BLOOD: Lipase: 28 U/L (ref 11–59)

## 2010-03-25 ENCOUNTER — Other Ambulatory Visit: Payer: Medicaid Other

## 2010-03-25 LAB — POCT CARDIAC MARKERS
CKMB, poc: <1 ng/mL — ABNORMAL LOW (ref 1.0–8.0)
CKMB, poc: <1 ng/mL — ABNORMAL LOW (ref 1.0–8.0)
Troponin i, poc: <0.05 ng/mL (ref 0.00–0.09)

## 2010-03-25 LAB — GLUCOSE, CAPILLARY
Glucose-Capillary: 100 mg/dL — ABNORMAL HIGH (ref 70–99)
Glucose-Capillary: 104 mg/dL — ABNORMAL HIGH (ref 70–99)
Glucose-Capillary: 108 mg/dL — ABNORMAL HIGH (ref 70–99)
Glucose-Capillary: 122 mg/dL — ABNORMAL HIGH (ref 70–99)
Glucose-Capillary: 127 mg/dL — ABNORMAL HIGH (ref 70–99)
Glucose-Capillary: 131 mg/dL — ABNORMAL HIGH (ref 70–99)
Glucose-Capillary: 143 mg/dL — ABNORMAL HIGH (ref 70–99)
Glucose-Capillary: 149 mg/dL — ABNORMAL HIGH (ref 70–99)
Glucose-Capillary: 186 mg/dL — ABNORMAL HIGH (ref 70–99)
Glucose-Capillary: 64 mg/dL — ABNORMAL LOW (ref 70–99)
Glucose-Capillary: 76 mg/dL (ref 70–99)
Glucose-Capillary: 81 mg/dL (ref 70–99)
Glucose-Capillary: 86 mg/dL (ref 70–99)
Glucose-Capillary: 89 mg/dL (ref 70–99)
Glucose-Capillary: 91 mg/dL (ref 70–99)
Glucose-Capillary: 93 mg/dL (ref 70–99)
Glucose-Capillary: 97 mg/dL (ref 70–99)

## 2010-03-25 LAB — BASIC METABOLIC PANEL
BUN: <1 mg/dL — ABNORMAL LOW (ref 6–23)
CO2: 23 mEq/L (ref 19–32)
CO2: 25 mEq/L (ref 19–32)
Calcium: 8.4 mg/dL (ref 8.4–10.5)
Chloride: 112 mEq/L (ref 96–112)
Chloride: 113 mEq/L — ABNORMAL HIGH (ref 96–112)
GFR calc Af Amer: >60 mL/min (ref >60)
GFR calc non Af Amer: >60 mL/min (ref >60)
Glucose, Bld: 101 mg/dL — ABNORMAL HIGH (ref 70–99)
Glucose, Bld: 95 mg/dL (ref 70–99)
Potassium: 4.2 mEq/L (ref 3.5–5.1)
Sodium: 139 mEq/L (ref 135–145)
Sodium: 142 mEq/L (ref 135–145)

## 2010-03-25 LAB — DIFFERENTIAL
Basophils Absolute: 0 10*3/uL (ref 0.0–0.1)
Basophils Relative: 0 % (ref 0–1)
Monocytes Absolute: 0.9 10*3/uL (ref 0.1–1.0)
Neutro Abs: 7.8 10*3/uL — ABNORMAL HIGH (ref 1.7–7.7)
Neutrophils Relative %: 71 % (ref 43–77)

## 2010-03-25 LAB — COMPREHENSIVE METABOLIC PANEL
ALT: 11 U/L (ref 0–35)
AST: 16 U/L (ref 0–37)
Albumin: 3.2 g/dL — ABNORMAL LOW (ref 3.5–5.2)
Alkaline Phosphatase: 73 U/L (ref 39–117)
BUN: 10 mg/dL (ref 6–23)
CO2: 22 mEq/L (ref 19–32)
Calcium: 7.1 mg/dL — ABNORMAL LOW (ref 8.4–10.5)
Chloride: 112 mEq/L (ref 96–112)
GFR calc Af Amer: >60 mL/min (ref >60)
GFR calc non Af Amer: >60 mL/min (ref >60)
Glucose, Bld: 78 mg/dL (ref 70–99)
Sodium: 138 mEq/L (ref 135–145)
Total Bilirubin: 0.6 mg/dL (ref 0.3–1.2)
Total Protein: 5.1 g/dL — ABNORMAL LOW (ref 6.0–8.3)

## 2010-03-25 LAB — CK TOTAL AND CKMB (NOT AT ARMC)
CK, MB: 2 ng/mL (ref 0.3–4.0)
Relative Index: INVALID (ref 0.0–2.5)
Total CK: 56 U/L (ref 7–177)

## 2010-03-25 LAB — LIPID PANEL
HDL: 51 mg/dL (ref >39)
VLDL: 15 mg/dL (ref 0–40)

## 2010-03-25 LAB — FERRITIN: Ferritin: 62 ng/mL (ref 10–291)

## 2010-03-25 LAB — CBC
HCT: 32.5 % — ABNORMAL LOW (ref 36.0–46.0)
HCT: 32.6 % — ABNORMAL LOW (ref 36.0–46.0)
Hemoglobin: 11.1 g/dL — ABNORMAL LOW (ref 12.0–15.0)
Hemoglobin: 11.2 g/dL — ABNORMAL LOW (ref 12.0–15.0)
MCHC: 34.2 g/dL (ref 30.0–36.0)
MCV: 97.3 fL (ref 78.0–100.0)
Platelets: 117 10*3/uL — ABNORMAL LOW (ref 150–400)
Platelets: 119 10*3/uL — ABNORMAL LOW (ref 150–400)
RBC: 3.23 MIL/uL — ABNORMAL LOW (ref 3.87–5.11)
RDW: 13.7 % (ref 11.5–15.5)
RDW: 13.8 % (ref 11.5–15.5)
WBC: 5.2 10*3/uL (ref 4.0–10.5)

## 2010-03-25 LAB — RETICULOCYTES
RBC.: 3.32 MIL/uL — ABNORMAL LOW (ref 3.87–5.11)
Retic Ct Pct: 1.2 % (ref 0.4–3.1)

## 2010-03-25 LAB — URINALYSIS, ROUTINE W REFLEX MICROSCOPIC
Bilirubin Urine: NEGATIVE
Specific Gravity, Urine: 1.004 — ABNORMAL LOW (ref 1.005–1.030)
pH: 6 (ref 5.0–8.0)

## 2010-03-25 LAB — IRON AND TIBC: UIBC: 278 ug/dL

## 2010-03-25 LAB — RAPID URINE DRUG SCREEN, HOSP PERFORMED
Cocaine: NOT DETECTED
Opiates: POSITIVE — AB

## 2010-03-25 LAB — FOLATE: Folate: 8.4 ng/mL

## 2010-03-25 LAB — CARDIAC PANEL(CRET KIN+CKTOT+MB+TROPI)
CK, MB: 1.9 ng/mL (ref 0.3–4.0)
Relative Index: INVALID (ref 0.0–2.5)
Relative Index: INVALID (ref 0.0–2.5)
Total CK: 54 U/L (ref 7–177)
Total CK: 59 U/L (ref 7–177)

## 2010-03-25 LAB — CLOSTRIDIUM DIFFICILE EIA

## 2010-03-25 LAB — LACTIC ACID, PLASMA: Lactic Acid, Venous: 1.4 mmol/L (ref 0.5–2.2)

## 2010-03-25 LAB — MAGNESIUM: Magnesium: 1.8 mg/dL (ref 1.5–2.5)

## 2010-03-25 LAB — PROTIME-INR: INR: 0.97 (ref 0.00–1.49)

## 2010-03-25 LAB — URINE MICROSCOPIC-ADD ON

## 2010-03-25 LAB — VITAMIN B12: Vitamin B-12: 560 pg/mL (ref 211–911)

## 2010-03-27 LAB — COMPREHENSIVE METABOLIC PANEL
BUN: 10 mg/dL (ref 6–23)
Calcium: 9.3 mg/dL (ref 8.4–10.5)
Creatinine, Ser: 0.7 mg/dL (ref 0.4–1.2)
Glucose, Bld: 87 mg/dL (ref 70–99)
Total Protein: 7.1 g/dL (ref 6.0–8.3)

## 2010-03-27 LAB — CBC
HCT: 38.6 % (ref 36.0–46.0)
Hemoglobin: 12.6 g/dL (ref 12.0–15.0)
MCHC: 32.7 g/dL (ref 30.0–36.0)
RDW: 13.8 % (ref 11.5–15.5)

## 2010-03-27 LAB — DIFFERENTIAL
Basophils Absolute: 0 10*3/uL (ref 0.0–0.1)
Basophils Relative: 0 % (ref 0–1)
Eosinophils Relative: 1 % (ref 0–5)
Lymphocytes Relative: 40 % (ref 12–46)
Lymphs Abs: 3.3 10*3/uL (ref 0.7–4.0)
Monocytes Absolute: 0.1 10*3/uL (ref 0.1–1.0)
Monocytes Relative: 1 % — ABNORMAL LOW (ref 3–12)
Neutro Abs: 4.8 10*3/uL (ref 1.7–7.7)
Neutrophils Relative %: 58 % (ref 43–77)
Promyelocytes Absolute: 0 %

## 2010-04-06 LAB — URINE MICROSCOPIC-ADD ON

## 2010-04-06 LAB — CBC
HCT: 39.8 % (ref 36.0–46.0)
MCV: 98.9 fL (ref 78.0–100.0)
Platelets: 172 10*3/uL (ref 150–400)
RBC: 4.02 MIL/uL (ref 3.87–5.11)
WBC: 4.6 10*3/uL (ref 4.0–10.5)

## 2010-04-06 LAB — URINALYSIS, ROUTINE W REFLEX MICROSCOPIC
Bilirubin Urine: NEGATIVE
Glucose, UA: NEGATIVE mg/dL
Ketones, ur: NEGATIVE mg/dL
pH: 7 (ref 5.0–8.0)

## 2010-04-06 LAB — BASIC METABOLIC PANEL
BUN: 6 mg/dL (ref 6–23)
Chloride: 107 mEq/L (ref 96–112)
Potassium: 4.3 mEq/L (ref 3.5–5.1)

## 2010-04-06 LAB — RAPID URINE DRUG SCREEN, HOSP PERFORMED
Cocaine: NOT DETECTED
Opiates: POSITIVE — AB

## 2010-04-06 LAB — DIFFERENTIAL
Eosinophils Absolute: 0 10*3/uL (ref 0.0–0.7)
Eosinophils Relative: 1 % (ref 0–5)
Lymphs Abs: 0.7 10*3/uL (ref 0.7–4.0)
Monocytes Relative: 5 % (ref 3–12)

## 2010-04-06 LAB — ETHANOL: Alcohol, Ethyl (B): 58 mg/dL — ABNORMAL HIGH (ref 0–10)

## 2010-04-07 LAB — COMPREHENSIVE METABOLIC PANEL
ALT: 19 U/L (ref 0–35)
AST: 31 U/L (ref 0–37)
Albumin: 4 g/dL (ref 3.5–5.2)
CO2: 23 mEq/L (ref 19–32)
Calcium: 8.9 mg/dL (ref 8.4–10.5)
GFR calc Af Amer: >60 mL/min (ref >60)
GFR calc non Af Amer: >60 mL/min (ref >60)
Sodium: 138 mEq/L (ref 135–145)
Total Protein: 7 g/dL (ref 6.0–8.3)

## 2010-04-07 LAB — DIFFERENTIAL
Eosinophils Absolute: 0.1 10*3/uL (ref 0.0–0.7)
Lymphs Abs: 1.4 10*3/uL (ref 0.7–4.0)
Neutrophils Relative %: 67 % (ref 43–77)

## 2010-04-07 LAB — URINE MICROSCOPIC-ADD ON

## 2010-04-07 LAB — URINALYSIS, ROUTINE W REFLEX MICROSCOPIC
Bilirubin Urine: NEGATIVE
Glucose, UA: NEGATIVE mg/dL
Ketones, ur: NEGATIVE mg/dL
pH: 7 (ref 5.0–8.0)

## 2010-04-07 LAB — CBC
MCHC: 33.8 g/dL (ref 30.0–36.0)
Platelets: 164 10*3/uL (ref 150–400)
RBC: 4.27 MIL/uL (ref 3.87–5.11)

## 2010-04-07 LAB — LACTIC ACID, PLASMA: Lactic Acid, Venous: 2.3 mmol/L — ABNORMAL HIGH (ref 0.5–2.2)

## 2010-04-07 LAB — LIPASE, BLOOD: Lipase: 31 U/L (ref 11–59)

## 2010-04-08 ENCOUNTER — Ambulatory Visit
Admission: RE | Admit: 2010-04-08 | Discharge: 2010-04-08 | Disposition: A | Discharge status: Home / Self Care | Payer: Medicaid Other | Source: Ambulatory Visit | Attending: Interventional Radiology | Admitting: Interventional Radiology

## 2010-04-08 VITALS — BP 164/87 | HR 78 | Temp 98.2°F | Resp 17 | Ht 64.0 in | Wt 82.0 lb

## 2010-04-08 DIAGNOSIS — N2889 Other specified disorders of kidney and ureter: Secondary | ICD-10-CM

## 2010-04-08 LAB — CBC
HCT: 41.5 % (ref 36.0–46.0)
Hemoglobin: 14.1 g/dL (ref 12.0–15.0)
MCV: 97.1 fL (ref 78.0–100.0)
RBC: 4.27 MIL/uL (ref 3.87–5.11)
WBC: 7.8 10*3/uL (ref 4.0–10.5)

## 2010-04-08 LAB — DIFFERENTIAL
Basophils Relative: 2 % — ABNORMAL HIGH (ref 0–1)
Eosinophils Relative: 3 % (ref 0–5)
Lymphs Abs: 2.3 10*3/uL (ref 0.7–4.0)
Monocytes Absolute: 0.4 10*3/uL (ref 0.1–1.0)
Neutro Abs: 4.7 10*3/uL (ref 1.7–7.7)

## 2010-04-08 LAB — COMPREHENSIVE METABOLIC PANEL
Albumin: 3.9 g/dL (ref 3.5–5.2)
Alkaline Phosphatase: 63 U/L (ref 39–117)
BUN: 10 mg/dL (ref 6–23)
Creatinine, Ser: 0.73 mg/dL (ref 0.4–1.2)
Potassium: 4.6 mEq/L (ref 3.5–5.1)
Total Protein: 6.7 g/dL (ref 6.0–8.3)

## 2010-04-09 LAB — COMPREHENSIVE METABOLIC PANEL
ALT: 16 U/L (ref 0–35)
ALT: 18 U/L (ref 0–35)
AST: 23 U/L (ref 0–37)
AST: 24 U/L (ref 0–37)
Albumin: 4.2 g/dL (ref 3.5–5.2)
Alkaline Phosphatase: 70 U/L (ref 39–117)
CO2: 23 mEq/L (ref 19–32)
CO2: 23 mEq/L (ref 19–32)
Calcium: 9.6 mg/dL (ref 8.4–10.5)
Calcium: 9.7 mg/dL (ref 8.4–10.5)
GFR calc Af Amer: >60 mL/min (ref >60)
GFR calc Af Amer: >60 mL/min (ref >60)
GFR calc non Af Amer: >60 mL/min (ref >60)
Glucose, Bld: 119 mg/dL — ABNORMAL HIGH (ref 70–99)
Potassium: 3.6 mEq/L (ref 3.5–5.1)
Sodium: 135 mEq/L (ref 135–145)
Sodium: 136 mEq/L (ref 135–145)
Total Bilirubin: 0.8 mg/dL (ref 0.3–1.2)

## 2010-04-09 LAB — URINE MICROSCOPIC-ADD ON

## 2010-04-09 LAB — DIFFERENTIAL
Basophils Absolute: 0 10*3/uL (ref 0.0–0.1)
Eosinophils Absolute: 0 10*3/uL (ref 0.0–0.7)
Eosinophils Relative: 0 % (ref 0–5)
Lymphocytes Relative: 12 % (ref 12–46)
Lymphocytes Relative: 8 % — ABNORMAL LOW (ref 12–46)
Lymphs Abs: 0.6 10*3/uL — ABNORMAL LOW (ref 0.7–4.0)
Monocytes Absolute: 0.2 10*3/uL (ref 0.1–1.0)
Monocytes Absolute: 0.5 10*3/uL (ref 0.1–1.0)
Monocytes Relative: 3 % (ref 3–12)
Neutro Abs: 7 10*3/uL (ref 1.7–7.7)

## 2010-04-09 LAB — URINALYSIS, ROUTINE W REFLEX MICROSCOPIC
Bilirubin Urine: NEGATIVE
Glucose, UA: NEGATIVE mg/dL
Glucose, UA: NEGATIVE mg/dL
Ketones, ur: 15 mg/dL — AB
Ketones, ur: 40 mg/dL — AB
Leukocytes, UA: NEGATIVE
Specific Gravity, Urine: 1.026 (ref 1.005–1.030)
pH: 6 (ref 5.0–8.0)
pH: 6.5 (ref 5.0–8.0)

## 2010-04-09 LAB — LIPASE, BLOOD
Lipase: 19 U/L (ref 11–59)
Lipase: 19 U/L (ref 11–59)

## 2010-04-09 LAB — CBC
Hemoglobin: 14.5 g/dL (ref 12.0–15.0)
MCHC: 33.8 g/dL (ref 30.0–36.0)
MCHC: 34.1 g/dL (ref 30.0–36.0)
Platelets: 154 10*3/uL (ref 150–400)
RBC: 4.2 MIL/uL (ref 3.87–5.11)
RBC: 4.37 MIL/uL (ref 3.87–5.11)
WBC: 8.3 10*3/uL (ref 4.0–10.5)
WBC: 8.6 10*3/uL (ref 4.0–10.5)

## 2010-04-09 NOTE — Progress Notes (Signed)
Appetite: poor  Weight:  82 lbs, pt reports 2 lb weight loss.  Frequent episodes of n & v.  Zofran does not seem to help w/  Sx.  C/O Left flank pain & abdominal pain.  Takes Dilaudid 1 tab qid which provides moderate relief.  Afebrile.  Denies hematuria.  Does report some difficulty w/ urination.    Sleep:  Poor   Fatigues easily.

## 2010-04-10 LAB — POCT I-STAT, CHEM 8
Creatinine, Ser: 1 mg/dL (ref 0.4–1.2)
HCT: 49 % — ABNORMAL HIGH (ref 36.0–46.0)
Hemoglobin: 16.7 g/dL — ABNORMAL HIGH (ref 12.0–15.0)
Potassium: 4.1 mEq/L (ref 3.5–5.1)
Sodium: 134 mEq/L — ABNORMAL LOW (ref 135–145)

## 2010-04-10 LAB — COMPREHENSIVE METABOLIC PANEL
ALT: 17 U/L (ref 0–35)
Alkaline Phosphatase: 87 U/L (ref 39–117)
CO2: 22 mEq/L (ref 19–32)
Chloride: 105 mEq/L (ref 96–112)
GFR calc non Af Amer: >60 mL/min (ref >60)
Glucose, Bld: 129 mg/dL — ABNORMAL HIGH (ref 70–99)
Potassium: 4.6 mEq/L (ref 3.5–5.1)
Sodium: 138 mEq/L (ref 135–145)

## 2010-04-10 LAB — CBC
Hemoglobin: 15.1 g/dL — ABNORMAL HIGH (ref 12.0–15.0)
RBC: 4.58 MIL/uL (ref 3.87–5.11)
WBC: 5.8 10*3/uL (ref 4.0–10.5)

## 2010-04-10 LAB — URINALYSIS, ROUTINE W REFLEX MICROSCOPIC
Ketones, ur: 15 mg/dL — AB
Leukocytes, UA: NEGATIVE
Protein, ur: 30 mg/dL — AB
Urobilinogen, UA: 1 mg/dL (ref 0.0–1.0)

## 2010-04-10 LAB — DIFFERENTIAL
Basophils Relative: 0 % (ref 0–1)
Eosinophils Absolute: 0 10*3/uL (ref 0.0–0.7)
Eosinophils Relative: 0 % (ref 0–5)
Neutrophils Relative %: 78 % — ABNORMAL HIGH (ref 43–77)

## 2010-04-11 LAB — URINALYSIS, ROUTINE W REFLEX MICROSCOPIC
Bilirubin Urine: NEGATIVE
Glucose, UA: NEGATIVE mg/dL
Ketones, ur: NEGATIVE mg/dL
Leukocytes, UA: NEGATIVE
Specific Gravity, Urine: 1.006 (ref 1.005–1.030)
pH: 6 (ref 5.0–8.0)

## 2010-04-11 LAB — DIFFERENTIAL
Basophils Absolute: 0 10*3/uL (ref 0.0–0.1)
Basophils Relative: 0 % (ref 0–1)
Lymphocytes Relative: 12 % (ref 12–46)
Neutro Abs: 6.4 10*3/uL (ref 1.7–7.7)
Neutrophils Relative %: 84 % — ABNORMAL HIGH (ref 43–77)

## 2010-04-11 LAB — RAPID URINE DRUG SCREEN, HOSP PERFORMED
Amphetamines: NOT DETECTED
Barbiturates: NOT DETECTED
Opiates: POSITIVE — AB

## 2010-04-11 LAB — CBC
Hemoglobin: 14.2 g/dL (ref 12.0–15.0)
MCHC: 35 g/dL (ref 30.0–36.0)
Platelets: 155 10*3/uL (ref 150–400)
RDW: 13.9 % (ref 11.5–15.5)

## 2010-04-11 LAB — COMPREHENSIVE METABOLIC PANEL
ALT: 28 U/L (ref 0–35)
AST: 52 U/L — ABNORMAL HIGH (ref 0–37)
Alkaline Phosphatase: 125 U/L — ABNORMAL HIGH (ref 39–117)
CO2: 23 mEq/L (ref 19–32)
Calcium: 9.5 mg/dL (ref 8.4–10.5)
Chloride: 105 mEq/L (ref 96–112)
GFR calc non Af Amer: >60 mL/min (ref >60)
Glucose, Bld: 95 mg/dL (ref 70–99)
Sodium: 138 mEq/L (ref 135–145)
Total Bilirubin: 1 mg/dL (ref 0.3–1.2)

## 2010-04-11 LAB — URINE MICROSCOPIC-ADD ON

## 2010-04-11 LAB — LIPASE, BLOOD: Lipase: 180 U/L — ABNORMAL HIGH (ref 11–59)

## 2010-04-13 LAB — BASIC METABOLIC PANEL
BUN: 7 mg/dL (ref 6–23)
CO2: 20 mEq/L (ref 19–32)
CO2: 24 mEq/L (ref 19–32)
Chloride: 107 mEq/L (ref 96–112)
Chloride: 108 mEq/L (ref 96–112)
Creatinine, Ser: 0.7 mg/dL (ref 0.4–1.2)
GFR calc Af Amer: >60 mL/min (ref >60)
Glucose, Bld: 176 mg/dL — ABNORMAL HIGH (ref 70–99)
Potassium: 4.5 mEq/L (ref 3.5–5.1)

## 2010-04-13 LAB — COMPREHENSIVE METABOLIC PANEL
ALT: <6 U/L (ref 0–35)
Albumin: 3.6 g/dL (ref 3.5–5.2)
Alkaline Phosphatase: 79 U/L (ref 39–117)
BUN: 9 mg/dL (ref 6–23)
Chloride: 109 mEq/L (ref 96–112)
Glucose, Bld: 99 mg/dL (ref 70–99)
Potassium: 4 mEq/L (ref 3.5–5.1)
Sodium: 142 mEq/L (ref 135–145)
Total Bilirubin: 0.1 mg/dL — ABNORMAL LOW (ref 0.3–1.2)
Total Protein: 6.5 g/dL (ref 6.0–8.3)

## 2010-04-13 LAB — CBC
HCT: 38.3 % (ref 36.0–46.0)
HCT: 42.5 % (ref 36.0–46.0)
Hemoglobin: 13.4 g/dL (ref 12.0–15.0)
Hemoglobin: 14.3 g/dL (ref 12.0–15.0)
MCHC: 33.6 g/dL (ref 30.0–36.0)
MCHC: 33.8 g/dL (ref 30.0–36.0)
MCV: 99.6 fL (ref 78.0–100.0)
MCV: 99.8 fL (ref 78.0–100.0)
Platelets: 125 10*3/uL — ABNORMAL LOW (ref 150–400)
RBC: 3.92 MIL/uL (ref 3.87–5.11)
RBC: 3.97 MIL/uL (ref 3.87–5.11)
RBC: 4.25 MIL/uL (ref 3.87–5.11)
RDW: 12.5 % (ref 11.5–15.5)
RDW: 13.6 % (ref 11.5–15.5)
WBC: 6 10*3/uL (ref 4.0–10.5)
WBC: 9.2 10*3/uL (ref 4.0–10.5)

## 2010-04-13 LAB — POCT CARDIAC MARKERS: Troponin i, poc: <0.05 ng/mL (ref 0.00–0.09)

## 2010-04-13 LAB — DIFFERENTIAL
Basophils Absolute: 0 10*3/uL (ref 0.0–0.1)
Basophils Relative: 1 % (ref 0–1)
Eosinophils Absolute: 0.1 10*3/uL (ref 0.0–0.7)
Monocytes Absolute: 0.3 10*3/uL (ref 0.1–1.0)
Monocytes Relative: 5 % (ref 3–12)
Neutro Abs: 4.1 10*3/uL (ref 1.7–7.7)
Neutrophils Relative %: 68 % (ref 43–77)

## 2010-04-15 LAB — CBC
HCT: 31.6 % — ABNORMAL LOW (ref 36.0–46.0)
HCT: 40.5 % (ref 36.0–46.0)
Hemoglobin: 10.9 g/dL — ABNORMAL LOW (ref 12.0–15.0)
Hemoglobin: 13.7 g/dL (ref 12.0–15.0)
MCHC: 34.5 g/dL (ref 30.0–36.0)
RBC: 3.22 MIL/uL — ABNORMAL LOW (ref 3.87–5.11)
RDW: 14.5 % (ref 11.5–15.5)
RDW: 14.8 % (ref 11.5–15.5)

## 2010-04-15 LAB — DIFFERENTIAL
Basophils Absolute: 0 10*3/uL (ref 0.0–0.1)
Basophils Relative: 0 % (ref 0–1)
Neutro Abs: 3.2 10*3/uL (ref 1.7–7.7)
Neutrophils Relative %: 61 % (ref 43–77)

## 2010-04-15 LAB — CARDIAC PANEL(CRET KIN+CKTOT+MB+TROPI)
CK, MB: 1.9 ng/mL (ref 0.3–4.0)
Relative Index: 1.8 (ref 0.0–2.5)
Total CK: 105 U/L (ref 7–177)
Troponin I: 0.02 ng/mL (ref 0.00–0.06)

## 2010-04-15 LAB — URINALYSIS, ROUTINE W REFLEX MICROSCOPIC
Glucose, UA: NEGATIVE mg/dL
Ketones, ur: NEGATIVE mg/dL
Leukocytes, UA: NEGATIVE
Protein, ur: NEGATIVE mg/dL
Urobilinogen, UA: 0.2 mg/dL (ref 0.0–1.0)

## 2010-04-15 LAB — COMPREHENSIVE METABOLIC PANEL
Alkaline Phosphatase: 66 U/L (ref 39–117)
Alkaline Phosphatase: 83 U/L (ref 39–117)
BUN: 7 mg/dL (ref 6–23)
BUN: 9 mg/dL (ref 6–23)
CO2: 25 mEq/L (ref 19–32)
Calcium: 8.2 mg/dL — ABNORMAL LOW (ref 8.4–10.5)
Chloride: 108 mEq/L (ref 96–112)
GFR calc non Af Amer: >60 mL/min (ref >60)
Glucose, Bld: 84 mg/dL (ref 70–99)
Glucose, Bld: 91 mg/dL (ref 70–99)
Potassium: 3.8 mEq/L (ref 3.5–5.1)
Total Bilirubin: 0.5 mg/dL (ref 0.3–1.2)
Total Protein: 5.1 g/dL — ABNORMAL LOW (ref 6.0–8.3)

## 2010-04-15 LAB — ETHANOL: Alcohol, Ethyl (B): 65 mg/dL — ABNORMAL HIGH (ref 0–10)

## 2010-04-15 LAB — CK TOTAL AND CKMB (NOT AT ARMC)
Relative Index: INVALID (ref 0.0–2.5)
Total CK: 86 U/L (ref 7–177)

## 2010-04-15 LAB — TSH: TSH: 2.72 u[IU]/mL (ref 0.350–4.500)

## 2010-04-15 LAB — LIPASE, BLOOD: Lipase: 20 U/L (ref 11–59)

## 2010-04-16 LAB — HEMOGLOBIN A1C
Hgb A1c MFr Bld: 5.4 % (ref 4.6–6.1)
Mean Plasma Glucose: 108 mg/dL

## 2010-04-16 LAB — CBC
HCT: 39.7 % (ref 36.0–46.0)
HCT: 41.9 % (ref 36.0–46.0)
Hemoglobin: 13.1 g/dL (ref 12.0–15.0)
MCHC: 33.6 g/dL (ref 30.0–36.0)
MCV: 97.5 fL (ref 78.0–100.0)
Platelets: 127 10*3/uL — ABNORMAL LOW (ref 150–400)
Platelets: 169 10*3/uL (ref 150–400)
RDW: 14.8 % (ref 11.5–15.5)
RDW: 14.9 % (ref 11.5–15.5)
WBC: 11.3 10*3/uL — ABNORMAL HIGH (ref 4.0–10.5)
WBC: 6.3 10*3/uL (ref 4.0–10.5)

## 2010-04-16 LAB — COMPREHENSIVE METABOLIC PANEL
ALT: 11 U/L (ref 0–35)
Albumin: 3.7 g/dL (ref 3.5–5.2)
Alkaline Phosphatase: 61 U/L (ref 39–117)
Chloride: 105 mEq/L (ref 96–112)
Glucose, Bld: 104 mg/dL — ABNORMAL HIGH (ref 70–99)
Potassium: 3 mEq/L — ABNORMAL LOW (ref 3.5–5.1)
Sodium: 136 mEq/L (ref 135–145)
Total Bilirubin: 0.9 mg/dL (ref 0.3–1.2)
Total Protein: 6.1 g/dL (ref 6.0–8.3)

## 2010-04-16 LAB — GLUCOSE, CAPILLARY
Glucose-Capillary: 109 mg/dL — ABNORMAL HIGH (ref 70–99)
Glucose-Capillary: 114 mg/dL — ABNORMAL HIGH (ref 70–99)
Glucose-Capillary: 134 mg/dL — ABNORMAL HIGH (ref 70–99)

## 2010-04-16 LAB — BASIC METABOLIC PANEL
BUN: 14 mg/dL (ref 6–23)
Chloride: 108 mEq/L (ref 96–112)
Creatinine, Ser: 0.66 mg/dL (ref 0.4–1.2)
Glucose, Bld: 100 mg/dL — ABNORMAL HIGH (ref 70–99)
Potassium: 4 mEq/L (ref 3.5–5.1)

## 2010-04-16 LAB — POCT CARDIAC MARKERS
CKMB, poc: <1 ng/mL — ABNORMAL LOW (ref 1.0–8.0)
Myoglobin, poc: 41.2 ng/mL (ref 12–200)
Troponin i, poc: <0.05 ng/mL (ref 0.00–0.09)

## 2010-04-16 LAB — LIPID PANEL
Total CHOL/HDL Ratio: 2.6 RATIO
VLDL: 21 mg/dL (ref 0–40)

## 2010-04-16 LAB — URINE MICROSCOPIC-ADD ON

## 2010-04-16 LAB — DIFFERENTIAL
Basophils Absolute: 0 10*3/uL (ref 0.0–0.1)
Basophils Relative: 0 % (ref 0–1)
Eosinophils Absolute: 0 10*3/uL (ref 0.0–0.7)
Eosinophils Relative: 0 % (ref 0–5)
Lymphs Abs: 1.2 10*3/uL (ref 0.7–4.0)
Neutrophils Relative %: 84 % — ABNORMAL HIGH (ref 43–77)

## 2010-04-16 LAB — URINALYSIS, ROUTINE W REFLEX MICROSCOPIC
Glucose, UA: NEGATIVE mg/dL
Ketones, ur: NEGATIVE mg/dL
Nitrite: NEGATIVE
Protein, ur: 30 mg/dL — AB
pH: 6.5 (ref 5.0–8.0)

## 2010-04-16 LAB — HEPATIC FUNCTION PANEL
Albumin: 4.3 g/dL (ref 3.5–5.2)
Alkaline Phosphatase: 72 U/L (ref 39–117)
Indirect Bilirubin: 0.6 mg/dL (ref 0.3–0.9)
Total Protein: 6.8 g/dL (ref 6.0–8.3)

## 2010-04-16 LAB — LIPASE, BLOOD: Lipase: 25 U/L (ref 11–59)

## 2010-04-16 LAB — CARDIAC PANEL(CRET KIN+CKTOT+MB+TROPI): Total CK: 249 U/L — ABNORMAL HIGH (ref 7–177)

## 2010-04-21 LAB — HEMOCCULT GUIAC POC 1CARD (OFFICE): Fecal Occult Bld: NEGATIVE

## 2010-05-05 ENCOUNTER — Other Ambulatory Visit (HOSPITAL_COMMUNITY): Payer: Self-pay | Admitting: Interventional Radiology

## 2010-05-07 ENCOUNTER — Encounter (HOSPITAL_COMMUNITY): Payer: Medicaid Other | Attending: Interventional Radiology

## 2010-05-07 DIAGNOSIS — Z01818 Encounter for other preprocedural examination: Secondary | ICD-10-CM | POA: Insufficient documentation

## 2010-05-11 ENCOUNTER — Other Ambulatory Visit (HOSPITAL_COMMUNITY): Payer: Self-pay | Admitting: Interventional Radiology

## 2010-05-15 ENCOUNTER — Ambulatory Visit (HOSPITAL_COMMUNITY)
Admission: RE | Admit: 2010-05-15 | Discharge: 2010-05-16 | Disposition: A | Discharge status: Home / Self Care | Payer: Medicaid Other | Source: Ambulatory Visit | Attending: Interventional Radiology | Admitting: Interventional Radiology

## 2010-05-15 ENCOUNTER — Ambulatory Visit (HOSPITAL_COMMUNITY)
Admission: RE | Admit: 2010-05-15 | Discharge: 2010-05-15 | Disposition: A | Discharge status: Home / Self Care | Payer: Medicaid Other | Source: Ambulatory Visit | Attending: Interventional Radiology | Admitting: Interventional Radiology

## 2010-05-15 ENCOUNTER — Ambulatory Visit (HOSPITAL_COMMUNITY): Payer: Medicaid Other

## 2010-05-15 DIAGNOSIS — C649 Malignant neoplasm of unspecified kidney, except renal pelvis: Secondary | ICD-10-CM | POA: Insufficient documentation

## 2010-05-15 DIAGNOSIS — F411 Generalized anxiety disorder: Secondary | ICD-10-CM | POA: Insufficient documentation

## 2010-05-15 DIAGNOSIS — J4489 Other specified chronic obstructive pulmonary disease: Secondary | ICD-10-CM | POA: Insufficient documentation

## 2010-05-15 DIAGNOSIS — Z79899 Other long term (current) drug therapy: Secondary | ICD-10-CM | POA: Insufficient documentation

## 2010-05-15 DIAGNOSIS — J449 Chronic obstructive pulmonary disease, unspecified: Secondary | ICD-10-CM | POA: Insufficient documentation

## 2010-05-15 DIAGNOSIS — R109 Unspecified abdominal pain: Secondary | ICD-10-CM | POA: Insufficient documentation

## 2010-05-15 LAB — PROTIME-INR
INR: 0.9 (ref 0.00–1.49)
Prothrombin Time: 12.4 seconds (ref 11.6–15.2)

## 2010-05-15 LAB — COMPREHENSIVE METABOLIC PANEL
ALT: 20 U/L (ref 0–35)
BUN: 13 mg/dL (ref 6–23)
CO2: 26 mEq/L (ref 19–32)
Calcium: 9.2 mg/dL (ref 8.4–10.5)
Creatinine, Ser: 0.58 mg/dL (ref 0.4–1.2)
GFR calc non Af Amer: >60 mL/min (ref >60)
Glucose, Bld: 97 mg/dL (ref 70–99)
Total Protein: 6.6 g/dL (ref 6.0–8.3)

## 2010-05-15 LAB — CBC
Hemoglobin: 13.3 g/dL (ref 12.0–15.0)
MCH: 32.5 pg (ref 26.0–34.0)
Platelets: 154 10*3/uL (ref 150–400)
RBC: 4.09 MIL/uL (ref 3.87–5.11)
WBC: 5.2 10*3/uL (ref 4.0–10.5)

## 2010-05-15 LAB — APTT: aPTT: 27 seconds (ref 24–37)

## 2010-05-15 LAB — CROSSMATCH
ABO/RH(D): A NEG
Antibody Screen: NEGATIVE

## 2010-05-15 MED ORDER — IOHEXOL 300 MG/ML  SOLN
100.0000 mL | Freq: Once | INTRAMUSCULAR | Status: AC | PRN
  Administered 2010-05-15: 75 mL via INTRAVENOUS

## 2010-05-16 LAB — BASIC METABOLIC PANEL
Chloride: 108 mEq/L (ref 96–112)
GFR calc non Af Amer: >60 mL/min (ref >60)
Glucose, Bld: 148 mg/dL — ABNORMAL HIGH (ref 70–99)
Potassium: 3.1 mEq/L — ABNORMAL LOW (ref 3.5–5.1)
Sodium: 141 mEq/L (ref 135–145)

## 2010-05-16 LAB — CBC
HCT: 33.5 % — ABNORMAL LOW (ref 36.0–46.0)
Hemoglobin: 11.2 g/dL — ABNORMAL LOW (ref 12.0–15.0)
MCV: 97.7 fL (ref 78.0–100.0)
RBC: 3.43 MIL/uL — ABNORMAL LOW (ref 3.87–5.11)
RDW: 14.1 % (ref 11.5–15.5)
WBC: 8.8 10*3/uL (ref 4.0–10.5)

## 2010-05-17 IMAGING — CR DG ABDOMEN ACUTE W/ 1V CHEST
4 series · 4 of 4 positions shown · non-contrast
Comparison: Acute abdomen series 05/08/2007.  CT abdomen pelvis
08/22/2007.

CLINICAL DATA: Abdominal pain.  Nausea and vomiting.  History of
pancreatitis.

ACUTE ABDOMEN SERIES (ABDOMEN 2 VIEW & CHEST 1 VIEW) 11/02/2007:

[w chest pa]
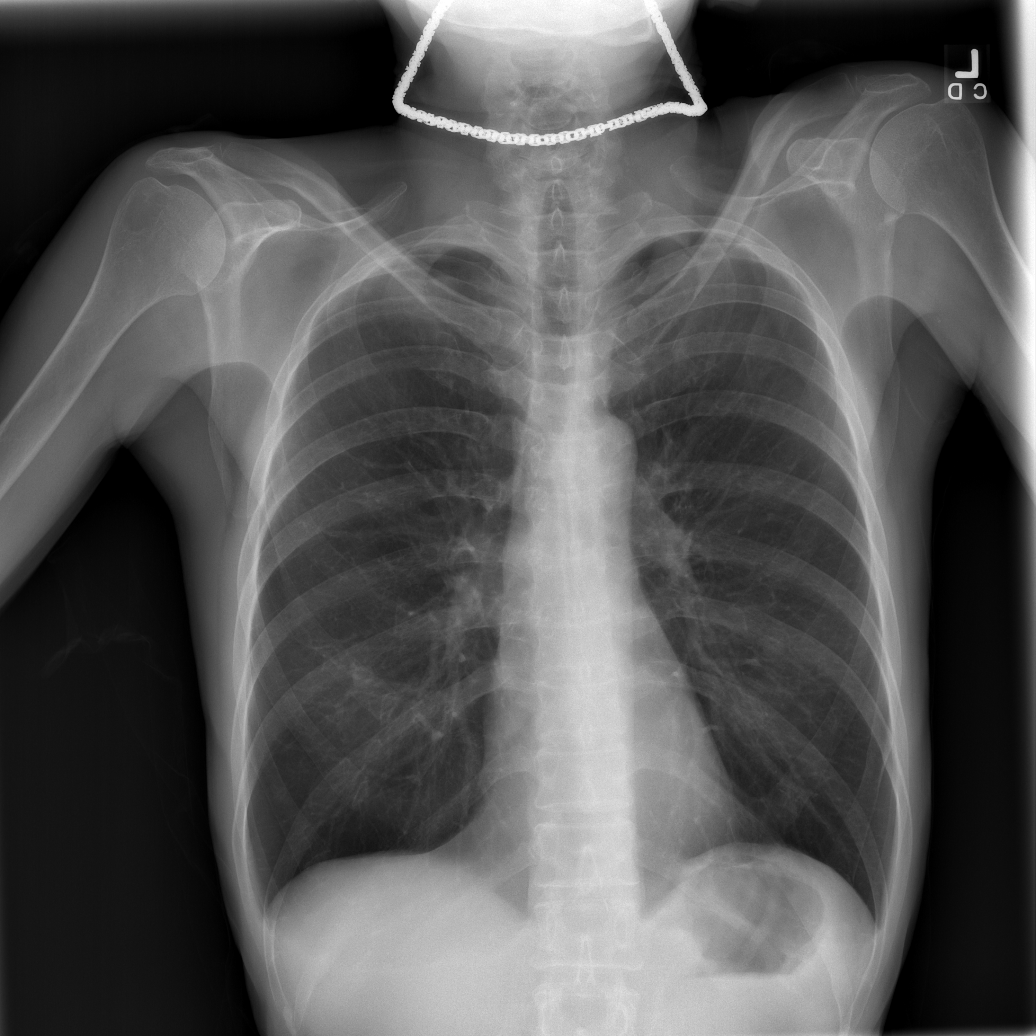

[w abdomen upright *]
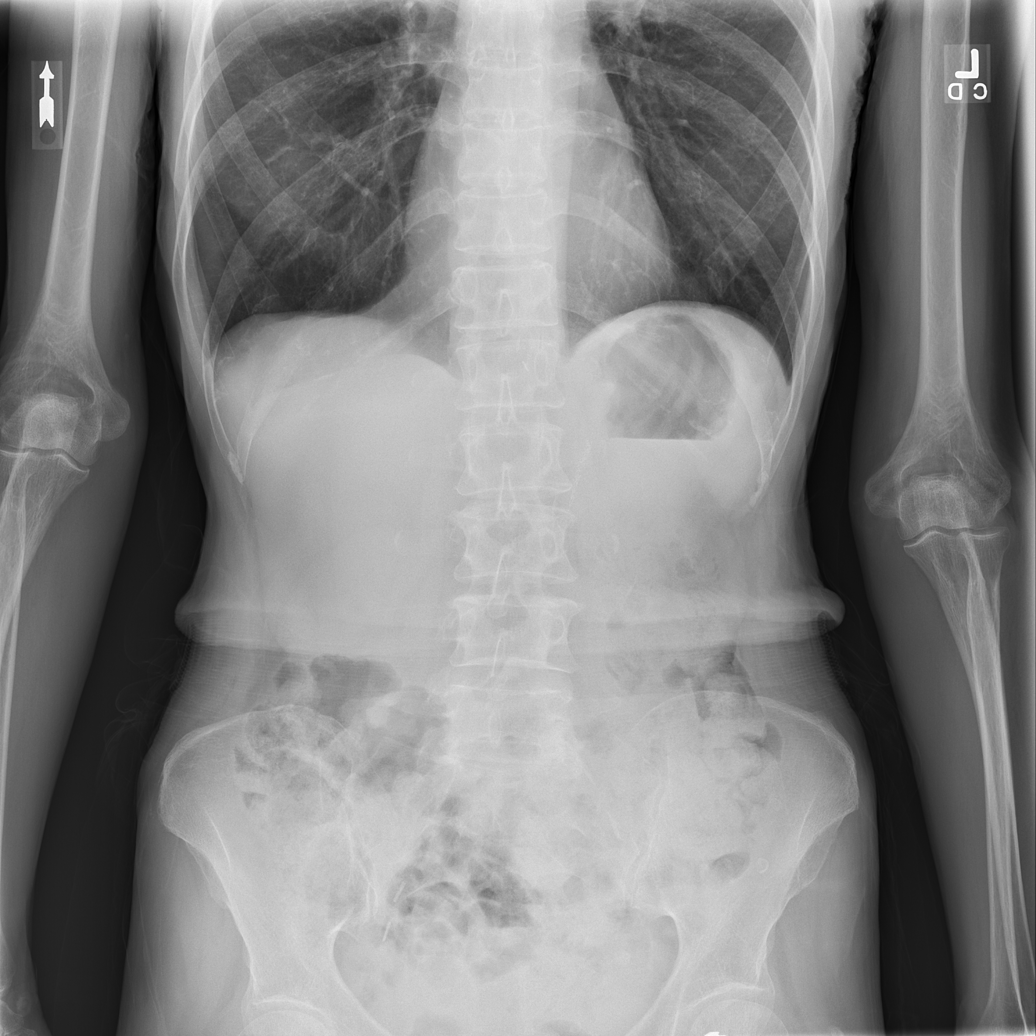

[t abdomen supine (1 of 2)]
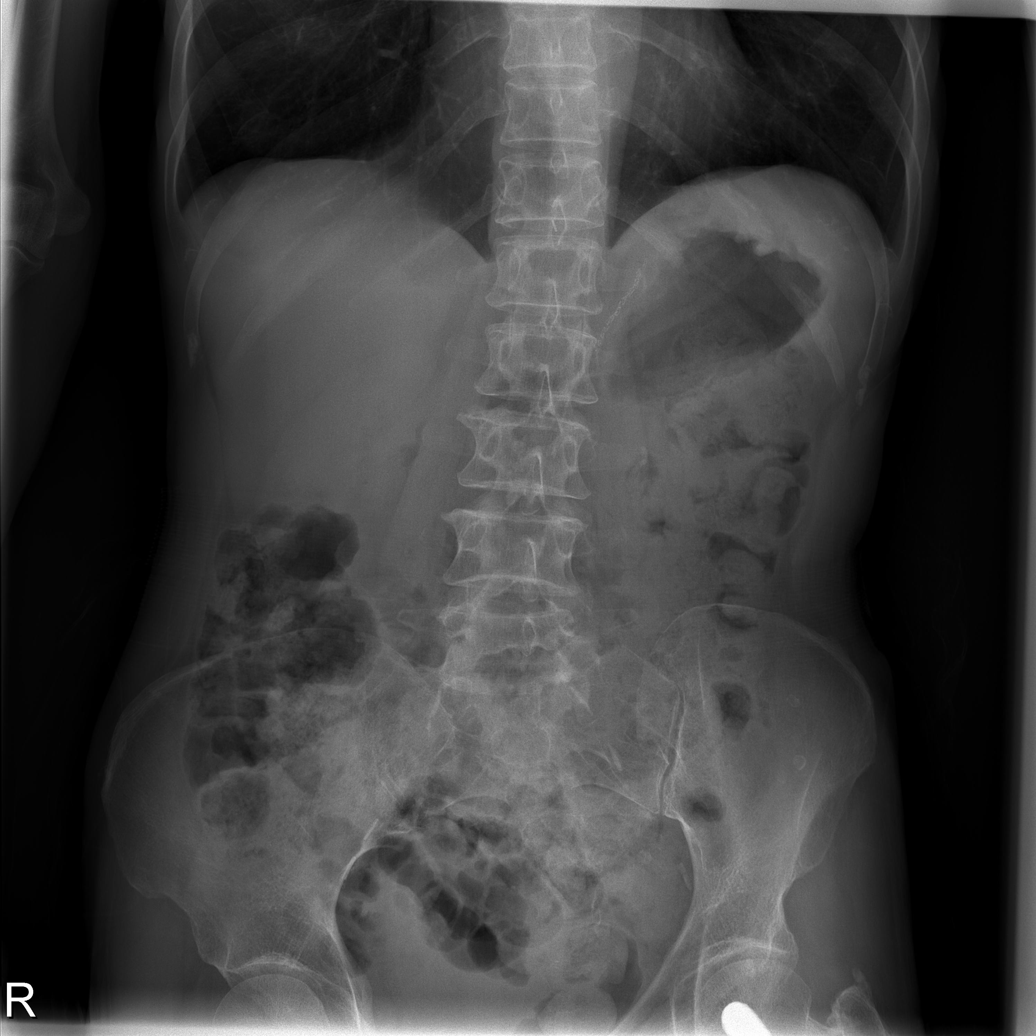

[t abdomen supine (2 of 2)]
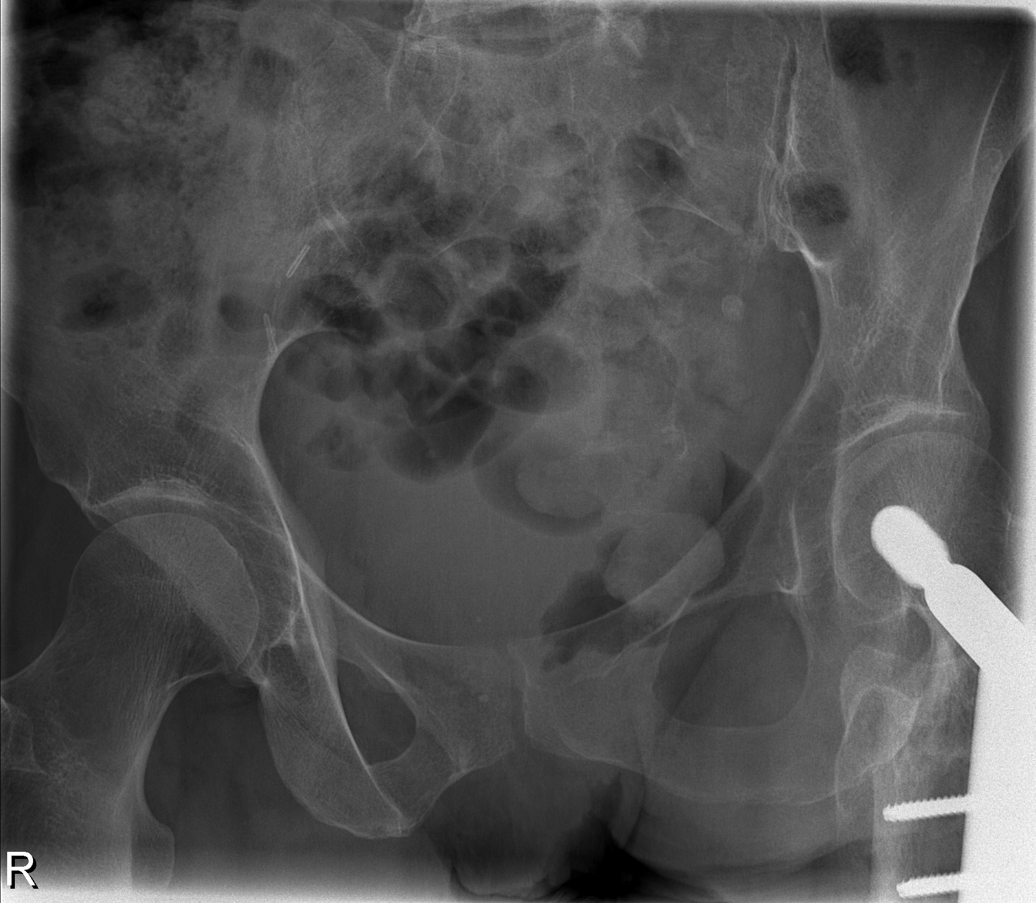

[4 of 4 positions shown; findings below may reference images not displayed]

FINDINGS: Bowel gas pattern unremarkable without evidence of
obstruction or significant ileus.  Moderate stool throughout the
colon from cecum to rectum.  Surgical clips in the right side of
the pelvis and in the left upper quadrant adjacent to the stomach.
No evidence of free intraperitoneal air or significant air fluid
levels on the erect image.  Phleboliths in the pelvis.  No visible
opaque urinary tract calculi.

Pulmonary hyperinflation, unchanged.  Cardiomediastinal silhouette
unremarkable.  Lungs clear.  No pleural effusions.
IMPRESSION: 1.  No acute abdominal abnormalities.
2.  Hyperinflation consistent with COPD or asthma.  No acute
cardiopulmonary disease.

## 2010-05-18 ENCOUNTER — Other Ambulatory Visit: Payer: Self-pay | Admitting: Interventional Radiology

## 2010-05-18 DIAGNOSIS — N2889 Other specified disorders of kidney and ureter: Secondary | ICD-10-CM

## 2010-05-19 NOTE — H&P (Signed)
NAMESMT., LODER               ACCOUNT NO.:  [id number]   MEDICAL RECORD NO.:  [id number]          PATIENT TYPE:  INP   LOCATION:  1345                         FACILITY:  [facility]   PHYSICIAN:  Wilson Singer, M.D.DATE OF BIRTH:  1950/11/29   DATE OF ADMISSION:  11/17/2006  DATE OF DISCHARGE:  11/18/2006                              HISTORY & PHYSICAL   HISTORY:  This is a 60 year old lady who has had several emergency room  visits for abdominal pain.  She does have a previous history of chronic  pancreatitis and on occasion has had acute pancreatitis.  She also has  chronic abdominal pain syndrome and takes regular Dilaudid for this.  She now presents with a 4-day history of abdominal pain centrally  located associated with nausea and vomiting.  She claims that she has  lost almost 100 pounds in the last 6 months or so also.  When she was  here 1 week ago on November 10, 2006, a CT scan of the abdomen was done  which revealed a 2.5 cm enhancing mass in the lower pole of the left  kidney very suspicious for renal cell carcinoma.  She tells me that she  does have an appointment with a urologist in the next few days for  evaluation of this.   PAST SURGICAL HISTORY:  1. Left hip open reduction and internal fixation in July of 2008.  2. Partial gastrectomy for peptic ulcer disease in 2004.  3. Previous history of hysterectomy.   PAST MEDICAL HISTORY:  1. Chronic pancreatitis as mentioned above.  2. History of alcohol abuse.  3. Chronic pain syndrome.  4. COPD.  5. Chronic anxiety.   MEDICATIONS:  1. Zofran 4 mg q.6h. as needed.  2. Dilaudid 2-4 mg q.6h. as needed.  3. Reglan 10 mg t.i.d.  4. Ativan 1-2 mg t.i.d. as needed.   ALLERGIES:  THORAZINE.   SOCIAL HISTORY:  She is married.  She continues to smoke cigarettes.  She says she last drank alcohol 3 weeks ago, and she is trying to quit  drinking alcohol.   FAMILY HISTORY:  Noncontributory.   REVIEW OF SYSTEMS:  Apart  from the symptoms mentioned above, there are  no other symptoms referable to all systems reviewed.   PHYSICAL EXAMINATION:  VITAL SIGNS:  Temperature 98.1, blood pressure  108/58, pulse 76, respiratory rate 14, saturation 97%.  GENERAL:  She does look cachectic and somewhat dehydrated.  CARDIOVASCULAR:  Heart sounds are present and normal without murmurs.  RESPIRATORY:  Lung fields are clear.  ABDOMEN:  Soft, mildly tender generally.  Bowel sounds are present.  NEUROLOGIC:  She is alert and oriented with no focal neurologic signs.   INVESTIGATIONS:  Hemoglobin 15.1, white blood cell count 8.2, platelets  180, sodium 138, potassium 3.3, bicarbonate 21, glucose 118, BUN 7,  creatinine 0.6, lipase normal at 34.   IMPRESSION:  1. Chronic abdominal pain with nausea and vomiting.  2. Left renal mass recently discovered.  3. History of chronic pancreatitis.  4. History of alcohol abuse.  5. Hypokalemia.   PLAN:  1. Admit.  2. Intravenous fluids with potassium to replete potassium.  3. Antiemetics.  4. Analgesics.  5. Further recommendations will depend on patient's hospital progress.      Wilson Singer, M.D.  Electronically Signed     NCG/MEDQ  D:  11/17/2006  T:  11/18/2006  Job:  [id number]   cc:   Lorelle Formosa, M.D.  Fax: [phone]

## 2010-05-19 NOTE — H&P (Signed)
[name], VANTUYL               ACCOUNT NO.:  [id number]   MEDICAL RECORD NO.:  [id number]          PATIENT TYPE:  INP   LOCATION:  1421                         FACILITY:  [facility]   PHYSICIAN:  Della Goo, M.D. DATE OF BIRTH:  Sep 17, 1950   DATE OF ADMISSION:  04/15/2008  DATE OF DISCHARGE:                              HISTORY & PHYSICAL   PRIMARY CARE PHYSICIAN:  Dr. Lorelle Formosa.   CHIEF COMPLAINT:  Abdominal pain and chest pain.   HISTORY OF PRESENT ILLNESS:  This is a 60 year old female who has a  history of chronic pancreatitis and cirrhosis and previous history of  heavy alcohol abuse who presents to the emergency department with  complaints of severe epigastric pain which has been worsening over the  past 3 days.  She also reports having chest pain off and on.  She states  she has had nausea and vomiting, but denies having any hematemesis,  hematochezia or melena passage.  She denies having any constipation.  She is well-known to the [employer] and has had multiple  emergency room visits as well.   PAST MEDICAL HISTORY:  1. History of chronic pancreatitis with acute flare-ups.  2. History of cirrhosis.  3. Alcohol abuse.  4. Renal cell carcinoma left kidney, status post ablation by      interventional radiology.  5. COPD.  6. History of peptic ulcer disease.  7. Partial gastrectomy.  8. Status post left hip surgery.  9. Total abdominal hysterectomy.  10.Anxiety.   MEDICATIONS:  1. Dilaudid, which she states that she takes every 6 hours as needed.  2. Xanax 1 mg p.o. t.i.d.  3. Reglan 20 mg 1 p.o. daily.  4. Zofran 8 mg p.o. q.6 h., p.r.n.   ALLERGIES:  THORAZINE.   SOCIAL HISTORY:  The patient is married.  She is a smoker, smokes one  half-a-pack of cigarettes daily.  She does have a history of alcohol  abuse, but states that she has stopped drinking.  She denies any illicit  drug usage.   FAMILY HISTORY:  Positive for significant  alcoholism in her father and  brothers.   REVIEW OF SYSTEMS:  Pertinents are mentioned above.   PHYSICAL EXAMINATION:  GENERAL:  This is a sickly and older than stated  age appearing 60 year old Caucasian female in discomfort but no acute  distress.  VITAL SIGNS:  Temperature 98.4, blood pressure 142/105 initially, now  146/90, heart rate 90, respirations 20.  O2 saturations 96-100%.  HEENT:  Normocephalic, atraumatic.  There is no scleral icterus.  Pupils  are equal, round and reactive to light.  Extraocular movements are  intact.  Funduscopic benign.  Nares are patent bilaterally.  Oropharynx  is clear.  NECK:  Supple.  Full range of motion.  No thyromegaly, adenopathy or  jugulovenous distention.  CARDIOVASCULAR:  Regular rate and rhythm.  No  murmurs, gallops or rubs.  LUNGS:  Clear to auscultation bilaterally.  ABDOMEN:  Positive bowel sounds.  Soft, nontender, nondistended.  EXTREMITIES:  Without cyanosis, clubbing or edema.  NEUROLOGIC:  The patient is alert and oriented x3.  Cranial nerves II-  XII are intact.  There are no motor or sensory deficits.   LABORATORY STUDIES:  White blood cell count 5.4, hemoglobin 13.7,  hematocrit 40.5, MCV 99.2, platelets 144, neutrophils 61% lymphocytes  31%.  Sodium 141, potassium 3.8, chloride 108, carbon dioxide 22, BUN 9,  creatinine 0.74 and glucose 84.  Alcohol level 65, lipase 20.  Chest x-  ray reveals COPD changes and changes consistent with chronic bronchitis,  but no acute disease findings.  Urinalysis is negative except for small  hemoglobin.   ASSESSMENT:  A 60 year old female being admitted with;  1. Epigastric abdominal pain.  2. Chronic pancreatitis.  3. Chest pain.  4. Alcoholism.  5. History of polysubstance abuse.   PLAN:  The patient will be admitted to a telemetry area for cardiac  monitoring.  Cardiac enzymes will be started.  An EKG will be performed.  The patient will be placed on clear liquids and antiemetic  therapy has  been ordered as needed along with pain control therapy.  IV access in  this patient has been difficult and prior to patient being referred for  admission several attempts were made for IV access by the EDP.  The  patient refused to have an external jugular IV placed and the EDP opted  to place an interosseous IV, however, this was painful and the patient  decided to have this IV removed.  The patient has been encouraged to  drink plenty of fluids.  Her IV medications will be changed to IM while  she is lacking IV access and the IV team will attempt further for  peripheral vein access in this patient.  She is currently  hemodynamically stable.  A PICC line will be placed in the a.m.  If the  patient's status does acutely change, then central line access will be  requested if needed.      Della Goo, M.D.  Electronically Signed     HJ/MEDQ  D:  04/16/2008  T:  04/16/2008  Job:  [id number]

## 2010-05-19 NOTE — Discharge Summary (Signed)
NAMEADDISSON, [name]               ACCOUNT NO.:  [id number]   MEDICAL RECORD NO.:  [id number]          PATIENT TYPE:  INP   LOCATION:  1436                         FACILITY:  [facility]   PHYSICIAN:  Renee Ramus, MD       DATE OF BIRTH:  [birth year]   DATE OF ADMISSION:  03/20/2008  DATE OF DISCHARGE:                               DISCHARGE SUMMARY   PRIMARY DISCHARGE DIAGNOSIS:  Acute narcotic withdrawal.   SECONDARY DIAGNOSES:  1. Mild cirrhosis.  2. Mild chronic pancreatitis.  3. Mild chronic obstructive pulmonary disease.  4. Alcohol abuse.  5. Tobacco abuse.  6. Gastroesophageal reflux disease.  7. Severe anxiety.   HOSPITAL COURSE BY PROBLEM:  1. Acute narcotic withdrawal.  The patient is a 60 year old female who      was seen in the emergency department last a.m. with acute severe      abdominal pain.  The patient usually takes Dilaudid 4 mg p.o.      t.i.d.  She did run out of her prescription approximately 1 day      prior to admission and reports just not getting it refilled.  The      patient takes this medication for chronic abdominal pain.  The      patient then noted on the evening of admission acute abdominal pain      waking her from sleep.  She also had diaphoresis.  She was      nauseated and had vomiting, and she was seen in the emergency      department relatively hypertensive with systolic blood pressure of      198.  The patient was also tachycardiac with a heart rate of 105.      The patient was given Dilaudid and her symptoms seemed to resolve.      She had an abdominal CT scan that showed no evidence of      pancreatitis or acute cholecystitis.  She had normal lipase.  While      she had initial leukocytosis, this resolved without treatment with      antibiotics.  I believe the patient is suffering from acute      withdrawal of narcotics and she will be given 10 Dilaudid with      instructions to follow up with her primary care physician to review      her  prescription as soon as possible.  The patient has been      lectured with regards to narcotic withdrawal and how, if she wishes      to decrease her narcotic intake, she has to taper it slowly over a      period of weeks.  The patient seems to understand this and is      promises to be compliant.  2. Cirrhosis.  This is stable.  The patient has no evidence of severe      liver dysfunction at this time.  3. Chronic obstructive pulmonary disease.  This is stable.  While the      patient has hyperinflated lung fields and decreased voltage on her  EKG, she does not have chronic obstructive pulmonary disease to the      degree that requires medication.  4. Alcohol abuse.  The patient swears that she has not engaged in      alcohol use and we have no evidence that she has.  5. Tobacco abuse.  The patient is wearing a nicotine patch, but she      will continue smoking post discharge.  6. Gastroesophageal reflux disease.  We are writing a prescription for      proton pump inhibitor at discharge.  7. Anxiety.  The patient will be continued on her Ativan post      discharge.   LABS OF NOTE:  1. Initial leukocytosis, white count 11.3 decreasing to 6.3.  2. Platelet count mildly decreased at 127,000.  3. Mild elevation in blood glucose at 114.  However, the patient was      put on steroids.  4. Elevated CK at 249 but with normal troponins x2 and MB fraction      that was not significant.  5. Cholesterol panel which was normal.  6. Lipase of 25.  7. UA showing a large amount of blood but no evidence of infection.      Relatively concentrated specific gravity of 1.020.   STUDIES:  1. CT of the abdomen and pelvis showing no acute pelvic pathology and      no acute abdominal pathology, specifically no sign of pancreatitis      and only mild thickening of the gallbladder wall but no evidence of      ductal dilatation.  No bowel pathology, air, mass, or adenopathies      were noted.  CT was  read as negative.  2. EKG showing normal sinus rhythm without evidence of acute ischemia      or infarction.   MEDICATIONS ON DISCHARGE:  1. Dilaudid 4 mg p.o. t.i.d. p.r.n. pain.  2. Ativan 1 mg p.o. t.i.d. p.r.n. anxiety.  3. Zofran 4 mg p.o. t.i.d. p.r.n. nausea and vomiting.  4. Protonix 40 mg p.o. daily.   No labs or studies pending at time of discharge.  The patient is in  stable condition and anxious for discharge.  Time spent, 35 minutes.      Renee Ramus, MD  Electronically Signed     JF/MEDQ  D:  03/21/2008  T:  03/21/2008  Job:  [id number]   cc:   Lorelle Formosa, M.D.  Fax: [phone]

## 2010-05-19 NOTE — Op Note (Signed)
NAMEJOSY, PEADEN               ACCOUNT NO.:  [id number]   MEDICAL RECORD NO.:  [id number]          PATIENT TYPE:  INP   LOCATION:  1603                         FACILITY:  [facility]   PHYSICIAN:  Harvie Junior, M.D.   DATE OF BIRTH:  [birth year]   DATE OF PROCEDURE:  07/31/2006  DATE OF DISCHARGE:                               OPERATIVE REPORT   PREOPERATIVE DIAGNOSIS:  Comminuted intertrochanteric hip fracture,  left.   POSTOPERATIVE DIAGNOSIS:  Comminuted intertrochanteric hip fracture,  left.   OPERATION PERFORMED:  Left open reduction internal fixation of  comminuted intertrochanteric hip fracture.   SURGEON:  Harvie Junior, M.D.   ASSISTANT:  Lindwood Qua, P.A.   ANESTHESIA:  General.   INDICATIONS FOR PROCEDURE:  Ms. Krichbaum is a 60 year old female with a  long history of having pancreatitis, chronic obstructive pulmonary  disease.  She fell last evening and ultimately made it to the emergency  room today with an intertrochanteric hip fracture.  She was evaluated in  the emergency room.  We were consulted for her management.  She was  taken to the operating room for fixation as the most reasonable course  of action.   DESCRIPTION OF PROCEDURE:  The patient was taken to the operating room.  After adequate anesthesia was obtained with general anesthetic, the  patient was placed supine on the operating table.  She was on the  fracture table and was placed into traction bed and a manipulative  closed reduction was undertaken giving a near anatomic alignment of the  fracture.  At this point the attention was turned towards the left hip  which was prepped and draped in the usual sterile fashion and following  this, incision was made down to the tensor fascia.  Tensor fascia was  divided in line with its fibers.  Subcutaneous tissues dissected down to  the level of the vastus lateralis fascia which was then divided and the  vastus lateralis muscle was retracted down to  bone and the lateral  aspect of the femur was identified.  A guide pin was put in to see the  angle of the hip and then 135 degree guide was used to get into the  center of the hip.  On both AP and lateral there was some posterior sag  to the femur at this point and this was chased with Cobb elevator which  was placed posterior and elevated the neck.  Once we got the dead center  on the head on the AP and lateral, attention was turned towards a second  guidewire which was placed to control rotation.  Once that was achieved,  then the head was drilled and a screw was then advanced into the central  portion of the head in both AP and lateral imaging.  Once this was  completed, a 135 degree 4-hole side plate was placed over this and  attached to the femur.  There was some comminution which could be seen  both laterally and medially.  At this point the plate was attached to  the femur with four screws in standard AO  fashion.  At this point the  wound was copiously and thoroughly irrigated and suctioned dry.  The  vastus lateralis fascia was closed with 0-0 Vicryl running suture.  The  tensor fascia was closed with 1-0 Vicryl running suture, the skin with 2-  0 Vicryl and skin staples.  Sterile compressive dressing was applied and  the patient taken to recovery room where she was noted to be in  satisfactory condition.  The estimated blood loss for this procedure was  less than 200 mL.      Harvie Junior, M.D.  Electronically Signed     JLG/MEDQ  D:  07/31/2006  T:  08/01/2006  Job:  [id number]

## 2010-05-19 NOTE — Discharge Summary (Signed)
[name], [name]               ACCOUNT NO.:  [id number]   MEDICAL RECORD NO.:  [id number]          PATIENT TYPE:  INP   LOCATION:  1307                         FACILITY:  [facility]   PHYSICIAN:  Ladell Pier, M.D.   DATE OF BIRTH:  Feb 14, 1950   DATE OF ADMISSION:  05/02/2006  DATE OF DISCHARGE:  05/09/2006                               DISCHARGE SUMMARY   DISCHARGE DIAGNOSES:  1. Acute on chronic pancreatitis with a lipase of 85.  2. Tobacco abuse.  3. Alcohol abuse.  Alcohol level on admission 56.  4. Chronic pain syndrome.  5. Chronic obstructive pulmonary disease.  6. History of gastrointestinal bleed.  7. History of gastroparesis.  8. Hypertension.  9. Cachexia.  10.Urinary tract infection.  11.Fever and leukocytosis secondary to urinary tract infection.  12  Hypokalemia.  1. Hypomagnesemia.   DISCHARGE MEDICATIONS:  1. Ativan 1 mg 3 times a day as per previously prescribed by primary      care physician,  #90 with zero refill.  2. Dilaudid 2 mg 1-2 four times a day per previously prescribed by      primary care physician.  3. Prilosec 20 mg daily.  4. Creon 10 mg 1-2 capsules with meals.  5. Nicoderm CQ one patch applied daily.  Remove old patch prior to      putting on new patch.   PROCEDURES:  PICC line placement.   CONSULTATIONS:  None   HISTORY OF PRESENT ILLNESS:  The patient is a 60 year old female with  past medical history significant for chronic pancreatitis.  The patient  presented to the hospital with abdominal pain, nausea and vomiting.  She  has had multiple admissions secondary to acute pancreatitis.   PAST MEDICAL HISTORY, FAMILY HISTORY, SOCIAL HISTORY, MEDICATIONS,  ALLERGIES, REVIEW OF SYSTEMS:  Per admission H&P.   PHYSICAL EXAMINATION ON DISCHARGE:  VITAL SIGNS:  Temperature 98.3,  pulse 62, respirations 18, blood pressure 130/70, pulse oximetry 97% on  room air.  HEENT: Head is normocephalic, atraumatic.  Pupils equal, round, and  reactive to light.  Throat without erythema.  CARDIOVASCULAR: Regular rhythm.  LUNGS:  Clear bilaterally.  ABDOMEN: Soft, nontender, positive bowel sounds.  EXTREMITIES:  Without edema.   HOSPITAL COURSE:  #1.  ACUTE ON CHRONIC PANCREATITIS:  The patient was admitted to the  hospital, made n.p.o., treated with IV fluids and Dilaudid IV.  The  patient gradually improved and was tolerating meals when she developed  abdominal pain again with food. She was made n.p.o. again, continued IV  medications, narcotics.  The patient then stated that she was feeling  fine.  She wanted to go home.  She stated that her husband has been  bringing her food, and she has been tolerating it okay.  Will try her on  some diet, and if she tolerates the diet, will be discharged today.   The patient discharged on her home Dilaudid and Ativan, to follow up  with primary care physician for refill of her prescriptions. She was  given 100 of Dilaudid and 90 of Ativan.   #2.  TOBACCO ABUSE:  The patient was interested in smoking cessation.  Gave her prescription for Nicoderm patch.   #3.  CHRONIC PANCREATITIS:  She stated that she had discontinued her  Creon because she thought she did not need it anymore. Gave her  prescriptions for Creon to take along with meals.   #4.  URINARY TRACT INFECTION:  The patient had fever and leukocytosis  most likely secondary to UTI.  She was treated with IV Rocephin for  urinary tract infection.   #5.  CHRONIC OBSTRUCTIVE PULMONARY DISEASE:  Secondary to tobacco abuse.  She had no respiratory distress while inpatient.   #6.  CHRONIC PAIN SYNDROME:  As mentioned above.  The patient discharged  on Dilaudid.   #7.  HYPOMAGNESEMIA AND HYPOKALEMIA:  The patient was repleted with both  prior to discharge.   DISCHARGE LABORATORY DATA:  Sodium 141, potassium 3.8, chloride 111, CO2  20,  creatinine 0.45, glucose 121. WBC 6.3, hemoglobin 9.9, platelets  142, MCV 88.8. Blood  culture negative. Lipase 42 on admission.   Chest x-ray showed very small left lower lobe pleural effusion and some  atelectasis.      Ladell Pier, M.D.  Electronically Signed     NJ/MEDQ  D:  05/09/2006  T:  05/09/2006  Job:  [id number]   cc:   Lorelle Formosa, M.D.  Fax: [phone]

## 2010-05-19 NOTE — H&P (Signed)
NAMEBRIANNI, [name]               ACCOUNT NO.:  [id number]   MEDICAL RECORD NO.:  [id number]          PATIENT TYPE:  INP   LOCATION:  1604                         FACILITY:  [facility]   PHYSICIAN:  Ladell Pier, M.D.   DATE OF BIRTH:  [birth year]   DATE OF ADMISSION:  01/15/2007  DATE OF DISCHARGE:                              HISTORY & PHYSICAL   CHIEF COMPLAINT:  Abdominal pain, nausea, and vomiting.   HISTORY OF PRESENT ILLNESS:  The patient is a 60 year old white female  with a history of chronic pancreatitis.  The patient presented to the ED  after she has been having increasing pain for the past 1 day with nausea  and vomiting.  She had been up all night secondary to the pain.  She saw  Dr. Ronne Binning about 4 weeks ago.  She is on chronic Dilaudid and Zofran  for nausea and abdominal pain.   PAST MEDICAL HISTORY SIGNIFICANT FOR:  1. Chronic pancreatitis.  2. Chronic abdominal pain.  3. History of alcohol abuse.  4. Tobacco abuse.  5. COPD.  6. Chronic anxiety.  7. History of left hip surgery in July 2008.  8. Status post partial gastrectomy secondary to peptic ulcer disease      in 2004.  9. Total abdominal hysterectomy.  10.Renal mass scheduled for surgery on Wednesday.   FAMILY HISTORY:  She states that she has a family history of alcohol  abuse.  Her father was an alcoholic.  She has brothers who are  alcoholics.   SOCIAL HISTORY:  The patient is married.  She smokes about two packs per  day.  She drinks every day.  She is trying to quit.  She states that her  husband also drinks daily.  She has two children, and they also drink.   MEDICATIONS:  1. Zofran 4 mg q.6 h. p.r.n.  2. Dilaudid 2-4 mg q.6 h. p.r.n.  3. Reglan 10 mg t.i.d.  4. Ativan 1-2 mg t.i.d. as needed.   ALLERGIES:  THORAZINE.   REVIEW OF SYSTEMS:  As per stated in the HPI.   PHYSICAL EXAM:  VITAL SIGNS:  Temperature 97.5, blood pressure 130/90,  pulse of 86, respirations 18, pulse oximetry 97% on  room air.  HEENT: Head is normocephalic, atraumatic.  Pupils reactive to light  without erythema.  CARDIOVASCULAR:  Regular rate and rhythm.  LUNGS:  Minimal rhonchi throughout.  ABDOMEN: Soft but diffusely tender.  Positive bowel sounds.  EXTREMITIES:  Without edema.   LABS:  Abdominal ultrasound shows common bile duct 11 mm, biliary  obstruction cannot be excluded, recommend LFTs, no obvious pancreatic  head mass, a 2.4 cm left renal mass.  WBC 5.1, hemoglobin 14.1, MCV  91.7, platelets 220.  Sodium 144, potassium 4, chloride 109, CO2 26,  glucose 93, BUN 7, creatinine 0.59, AST 18, ALT 10.  UA negative.   ASSESSMENT/PLAN:  1. Acute-on-chronic pancreatitis. Admit the patient to the floor.      Will give her IV fluids p.r.n. Dilaudid.  Will do bowel rest, start      her on D5 half-normal saline at  80 mL an hour.  2. Nausea, vomiting.  Will treat her with Zofran as needed.  3. Tobacco abuse.  Start her on a nicotine patch.  4. Renal mass.  The patient is scheduled for surgery on Wednesday.      Will notify urology the patient is in the hospital.      Ladell Pier, M.D.  Electronically Signed     NJ/MEDQ  D:  01/15/2007  T:  01/16/2007  Job:  [id number]   cc:   Dr. Ronne Binning

## 2010-05-19 NOTE — Discharge Summary (Signed)
[name], [name]               ACCOUNT NO.:  [id number]   MEDICAL RECORD NO.:  [id number]          PATIENT TYPE:  INP   LOCATION:  1510                         FACILITY:  [facility]   PHYSICIAN:  Lonia Blood, M.D.      DATE OF BIRTH:  1951-01-03   DATE OF ADMISSION:  01/15/2007  DATE OF DISCHARGE:  01/20/2007                               DISCHARGE SUMMARY   PRIMARY CARE PHYSICIAN:  Lorelle Formosa, M.D.   DISCHARGE DIAGNOSIS:  1. Renal cell carcinoma status post ablation by Dr. Fredia Sorrow.  2. Acute pancreatitis.  3. Hypokalemia.  4. Tobacco abuse.  5. Transient anemia, thrombocytopenia.   DISCHARGE MEDICATIONS:  1. Ativan 2 mg q.6 h p.r.n.  2. Dilaudid 4 mg q.6 h p.r.n.  3. Reglan 10 mg p.o. 3 times a day.  4. Zofran 4 mg q.6 h p.r.n..   DISPOSITION:  The patient was discharged home.  She will have a followup  with Dr. Fredia Sorrow, interventional radiology, in about 4 weeks for  further followup of her right renal cell carcinoma which was ablated  using radioablation.   PROCEDURES PERFORMED THIS ADMISSION:  1. Abdominal ultrasound performed January 11 showed gallbladder within      normal limits, common bile duct dilated at 11 mm, biliary      obstruction not excluded.  Left renal mass consistent with renal      cell carcinoma and obstructive aorta.  2. HIDA Scan performed on January 12 showed no acute findings, normal      gallbladder and ejection fraction.  3. Ultrasound-guided ablation of right renal cell carcinoma on January 18, 2007, by Dr. Fredia Sorrow.  4. Chest x-ray performed on January 12 showed no active      cardiopulmonary disease.  5. Another chest x-ray on January 14 showed stable COPD, no active      disease.  6. CT-guided biopsy on January 18, 2007, followed by radiofrequency      ablation of right renal cell carcinoma.  Results confirm renal cell      carcinoma from biopsy.   CONSULTATIONS:  Glenn T. Fredia Sorrow, M.D., interventional radiology.   BRIEF  HISTORY AND PHYSICAL:  Please refer to dictated History and  Physical on admission by Dr. Ladell Pier.  In short, however, this is  a 60 year old female with prior history of chronic pancreatitis who came  in with abdominal pain, nausea and vomiting.  The patient was examined  and found to have epigastric tenderness.  Her abdominal ultrasound  showed dilated common bile duct.  Her LFTs were for the most part  normal.  Lipase was minimally elevated.  The patient was, hence,  admitted with diagnosis of acute on chronic pancreatitis.   HOSPITAL COURSE:   #1.  ACUTE ON CHRONIC PANCREATITIS:  The patient was admitted with bowel  rest, IV fluids and IV pain medicine.  Subsequently, the patient  responded very well as indicated and gradually weaned back to her diet  prior to discharge.   #2.  RENAL CELL CARCINOMA:  The patient had right kidney mass that was  biopsied on January 14.  At the same time, she had radiofrequency  ablation.  The biopsy returned showing that this is renal cell  carcinoma.  She will have an outpatient followup with interventional  radiology and further treatment as needed.   #3.  HYPOKALEMIA:  Her potassium was slightly low at 3.3 secondary to  her nausea and vomiting.  That was repleted.   Otherwise, the patient was discharged in good health as indicated above.      Lonia Blood, M.D.  Electronically Signed     LG/MEDQ  D:  01/30/2007  T:  01/30/2007  Job:  [id number]   cc:   Sherrine Maples T. Fredia Sorrow, M.D.  Fax: [phone]   Lorelle Formosa, M.D.  Fax: [phone]

## 2010-05-19 NOTE — H&P (Signed)
[name], [name] NO.:  [id number]   MEDICAL RECORD NO.:  [id number]          PATIENT TYPE:  INP   LOCATION:  0108                         FACILITY:  [facility]   PHYSICIAN:  Acey Lav, MD  DATE OF BIRTH:  26-Jan-1950   DATE OF ADMISSION:  03/20/2008  DATE OF DISCHARGE:                              HISTORY & PHYSICAL   PRIMARY CARE PHYSICIAN:  Lorelle Formosa, M.D.   CHIEF COMPLAINT:  Severe abdominal pain, chest pain and dyspnea.   HISTORY OF PRESENT ILLNESS:  [name] is a 60 year old Caucasian lady  with a past medical history significant for heavy ethanol abuse,  cirrhosis, tobacco use, COPD and recurrent pancreatitis, along with  addiction to prescription narcotics.  She presents to the emergency room  with severe abdominal pain, which she states had an onset at 2:00 a.m.  this morning.  Of note, she has been in the ER multiple times on  multiple months; she was most recently seen on February 14, 2008 and  then February 18, 2008 for severe abdominal pain as well.  She had been  hospitalized this past Fall for acute pancreatitis as well.  At that  point in time she had been very difficult to take care of and had left  the hospital at times, and had been trying to acquire extra Dilaudid  pills.  In any case, [name] states that around 2 o'clock she was  awakened from her sleep with severe abdominal pain, which he states it  was in a band-like pattern across the top of her abdomen.  She rated it  as 20 out of 10, and it is burning deep pain.  Additionally, she began  to notice later today, around 3 o'clock in the afternoon, 7 out of 10  chest pressure like someone sitting on my chest, accompanied by  dyspnea.  This occured at rest and has persisted since then, though it  went down from 7/10 to 5/81 after dilaudid. It is reproducible with  palpation of her sternum. She has noted nausea and has had 4 episodes of  nonbilious vomiting, as well as  3 loose bowel movements during the last  24 hours as well.  Her pain was not well controlled by her Dilaudid that  she takes 4 mg 3 times daily.  Her nausea was not controlled by her  Zofran, which she also takes multiple times a day.  She has also felt  more short of breath and been wheezing, and had to use her albuterol  metered-dose inhaler more frequently today.   She was brought to the emergency room, where she had severe abdominal  pain requiring multiple doses of Dilaudid.  Apparently she also had some  orthostatic symptoms of dizziness.  With great difficulty, an IV was  eventually obtained and she was given intravenous Dilaudid as well as  Zofran multiple doses.   She had a CT of the abdomen and pelvis obtained, and this showed no  active intra-abdominal pathology or evidence on radiograph of ongoing  pancreatitis.  There was slight prominence of the biliary duct  as well  as some chronic thickening of the gallbladder wall.  She also had a  chest x-ray which showed some hyperinflation and some scarring, but no  active process.   Her lab work in the ER was relatively unremarkable, with normal lipase,  normal hepatic function.  Her UA had a small amount of leukocytes in it,  but negative nitrites and 3-6 white cells.  Her cardiac markers were  negative, and her EKG did not appear to show any acute ST or T-wave  changes; though it did show sinus tachycardia.   REVIEW OF SYSTEMS:  She denies dysuria, hematuria or fever.  She has  brought up some clear phlegm.   PAST MEDICAL HISTORY:  1. Recurrent pancreatitis.  2. COPD.  3. Hypertension.  4. Cirrhosis.  5. Tobacco and alcohol use.  6. Peptic ulcer disease.  7. Anxiety.  8. Renal cell carcinoma in the past.  9. History of polysubstance abuse of pain medicines  10.History of noncompliance with medical therapy.   PAST SURGICAL HISTORY:  1. Gastrectomy in 2005.  2. History of left hip surgery in 2008.  3. Total abdominal  hysterectomy in 2009.  4. Renal cell cancer ablation by Dr. Sherrine Maples T. Fredia Sorrow on January 18, 2007.   FAMILY HISTORY:  A brother has COPD.   SOCIAL HISTORY:  The patient is a previously heavy drinker, but states  she stopped drinking for 3 months.  She continues to smoke tobacco.  She  denies other recreational drug use.   REVIEW OF SYSTEMS:  As described above, otherwise a 10-point review of  systems was negative.   ALLERGIES:  The patient denied allergies today, although the chart shows  having THORAZINE in the past as an allergy.   CURRENT MEDICATIONS:  1. Dilaudid 4 mg three times daily.  2. Ativan 1 mg three times daily.  3. Zofran 4 mg as needed.   PHYSICAL EXAMINATION:  Blood pressure on arrival 198/130, later 128/50;  pulse 67, respirations 18, temperature maximum 98.2, pulse oximetry 94-  100% on 2 liters via nasal cannula.  GENERAL: Thin, cachectic lady.  HEENT:  Normocephalic, other than some facial wasting.  Pupils equal,  round and reactive to light, but slightly constricted.  Oropharynx is  dry.  CARDIOVASCULAR EXAM:  Tachycardic, but no murmurs, gallops or rubs.  LUNGS:  Have diffuse expiratory wheezes throughout all lung fields.  ABDOMEN:  Pain is out of portion to exam, but she is tender in her right  upper and epigastric, as well as left upper quadrant (right side more  than left).  Note, with auscultation of my stethoscope and pressing of  her abdomen, she was not nearly as much pain as when I pushed with my  hands purposely; although she did wince with the stethoscope too.  She  did not have evidence of rebound tenderness.  EXTREMITIES:  Without edema.  NEUROLOGICAL EXAM:  Nonfocal.   LABORATORY DATA:  Chest x-ray and CT scan as described above.  EKG  showed sinus tachycardia with biatrial enlargement.  She had T-waves  that were slightly peaked in V3 through V5; II and III.  She had some T-  wave inversion in V2 as well as in aVL, and a biphasic T in  I.  She did  not appear to have any acute  ST changes.   LABS:  UA:  3-6 white cells,  11-20 red blood cells, specific gravity  1.02, leukocytes small, nitrites negative.  Lipase normal at 25.  Hepatic function normal.  BMET normal, though slightly elevated glucose  of 100.  Cardiac enzymes were negative.   ASSESSMENT AND PLAN:  This is a 60 year old lady with a past medical  history significant for COPD, chronic pancreatitis with recurrent flares  due to heavy alcohol abuse, polysubstance abuse, and abuse of  prescription narcotics.  Also, history of renal cell carcinoma status  post ablation this January 2010.  She presents with severe abdominal  pain (though out of proportion on exam), chest pain pressure and  wheezing.   1. Abdominal Pain:  Despite the patient's history of of abuse of      multiple substances, including alcohol and prescription narcotics,      it is difficult to completely dismiss her pain.  She has normal      lipase and hepatic function, although this is not proof of not      having pancreatitis (particularly in a patient with chronic      pancreatitis).  I am suspicious that there is secondary gain      involvement, she is likely seeking narcotics again.  However, for      the time being I am going to give her vigorous intravenous fluid      and intravenous Dilaudid, and will keep her n.p.o.  I will not give      her any food until her pain is under better control, and she is      able to control with better oral narcotics.  I will have a      substance abuse counselor see her.  I will give her a banana bag      and vigorous hydration, and will replete electrolytes.  2. Chronic Obstructive Pulmonary Disease Exacerbation:  I will give      her Solu-Medrol along with beta agonists.  3. Chest Pressure:  This is most likely due to her pancreatitis.      Alternatively she may have secondary gain given her history of      addiction to narcotics and seeking narcotic  scripts from multiple      sources. Cardiac enzymes are negative.  I will cycle them 2 more      times and keep her on telemetry.  Of note, on exam her chest      pressure and pain was reproducible by palpating and pushing on her      chest.  Her pain also is atypical in its duration, because it has      persisted from this afternoon to the present, although it is      slightly better now after her multiple doses of Dilaudid.  4. History of Renal Cell Carcinoma:  No evidence of recurrence, based      on history or on radiographic imaging.  5. Anxiety.  Continue her Ativan.  6. Prophylaxis.  Heparin 5000 t.i.d.  7. Code status.  The patient is a Full Code.      Acey Lav, MD  Electronically Signed     CV/MEDQ  D:  03/20/2008  T:  03/20/2008  Job:  [id number]   cc:   Lorelle Formosa, M.D.  Fax: [phone]

## 2010-05-19 NOTE — Discharge Summary (Signed)
NAMEFRANCOISE, [name]               ACCOUNT NO.:  [id number]   MEDICAL RECORD NO.:  [id number]          PATIENT TYPE:  INP   LOCATION:  1345                         FACILITY:  [facility]   PHYSICIAN:  Wilson Singer, M.D.DATE OF BIRTH:  [birth year]   DATE OF ADMISSION:  11/17/2006  DATE OF DISCHARGE:  11/18/2006                               DISCHARGE SUMMARY   FINAL DISCHARGE DIAGNOSES:  1. Chronic abdominal pain syndrome.  2. History of chronic pancreatitis, not acutely on this occasion.  3. Possible left renal cell carcinoma, to be further investigated by      Urology.  4. History of alcohol abuse.   CONDITION ON DISCHARGE:  Stable.   MEDICATIONS ON DISCHARGE:  1. Dilaudid 2 mg, one to two tablets t.i.d. p.r.n.  2. Zofran 4 mg t.i.d. p.r.n.  3. Ativan 1 mg, one to two tablets t.i.d. p.r.n.  4. Phenergan 25 mg, one tablet every 6 hours p.r.n.   HISTORY:  This 60 year old lady, who is well known to the emergency room  and to our service, presents with once again nausea, vomiting and  abdominal pain.  Please see initial History & Physical Examination done  by myself.   HOSPITAL PROGRESS:  The patient was admitted although she looked  clinically well and somewhat dehydrated and put on intravenous fluids,  intravenous antiemetics and analgesics.  Her lipase was normal/negative.  She does have a possibility of a left renal mass and possible malignancy  that was discovered on a CT scan done on November 6th when she presented  to the emergency room at that time.  She says she does have a followup  appointment with a urologist and will keep it.  Today she says she has  abdominal pain but she appears to be very histrionic and there has  certainly been no vomiting, although there is some nausea.   PHYSICAL EXAMINATION:  Temperature 98.2, blood pressure 140/90, pulse  64, saturation 97% on room air.  ABDOMEN:  Soft and objectively not tender.  Bowel sounds are present and  normal.   Investigation today shows sedimentation rate of 7, white blood cell  count 5.8, hemoglobin 12.5, platelets 147,000, sodium 138, potassium  3.5, bicarbonate 25, BUN 4, creatinine 0.57.   DISPOSITION:  I am sending her home on all her oral medications  including Dilaudid, which she normally takes for pain, and I think the  issue has been that she ran out of her medications.  She has promised to  follow up with Urology in terms of her possible left renal cell  carcinoma.  She also must follow up with her primary care physician.      Wilson Singer, M.D.  Electronically Signed     NCG/MEDQ  D:  11/18/2006  T:  11/18/2006  Job:  [id number]

## 2010-05-19 NOTE — Discharge Summary (Signed)
NAMEFINLEIGH, CHEONG               ACCOUNT NO.:  [id number]   MEDICAL RECORD NO.:  [id number]          PATIENT TYPE:  INP   LOCATION:  1603                         FACILITY:  [facility]   PHYSICIAN:  Harvie Junior, M.D.   DATE OF BIRTH:  10-06-1950   DATE OF ADMISSION:  07/31/2006  DATE OF DISCHARGE:  08/03/2006                               DISCHARGE SUMMARY   ADMITTING DIAGNOSES:  1. Intertrochanteric hip fracture, left.  2. Chronic alcohol use.  3. Chronic pancreatitis.   POSTOPERATIVE DIAGNOSES:  1. Intertrochanteric hip fracture, left.  2. Chronic alcohol use.  3. Chronic pancreatitis.   PROCEDURE:  Open reduction and internal fixation of left hip fracture  with postoperative medical management and concerns for withdrawal.   BRIEF HISTORY:  [name] is a 60 year old female with a history of  alcohol abuse and pancreatitis who fell in her home on July 30, 2006.  She was found on the floor, put back into bed by her husband, continued  to have pain through the night and was ultimately was taken to the  hospital the following day where she was noted to have an  intertrochanteric hip fracture, and we were consulted.   PAST MEDICAL HISTORY:  1. Remarkable for COPD.  2. She has never had a heart attack or stroke.  3. She had pancreatitis.  4. She has had chronic alcohol abuse.   FAMILY HISTORY:  Reviewed and noncontributory.   SOCIAL HISTORY:  She is a smoker and a moderate drinker.   MEDICATIONS:  The medications that she could remember were:  1. Zofran.  2. Dilaudid.  3. Reglan.  4. Ativan.   REVIEW OF SYSTEMS:  Otherwise unremarkable.   PHYSICAL EXAMINATION ON ADMISSION:  VITAL SIGNS:  Temperature was 99.1,  pulse 109, respirations 22, blood pressure 120/85.  GENERAL:  She is an ill-appearing female looking much older than her  stated age.  HEENT:  Within normal limits.  NECK:  Nontender to palpation.  CHEST:  Clear to auscultation.  HEART:  Regular rate and  rhythm.  ABDOMEN:  Soft and nontender.  GU/PELVIC:  Not performed.  EXTREMITIES:  She was externally rotated and shortened in the left hip.  She had good pulses distally, and she was intact neurologically  distally.   LABORATORY ON ADMISSION:  Sodium was 138, potassium 4.0, chloride 106,  bicarbonate 19, BUN 10 and 0.54.  UA was negative.  Her white blood cell  count was 10.5, hemoglobin 11.8 and 35 with platelets 142.  Pro time was  12.7, INR of 0.9.  X-rays showed a left intertrochanteric hip fracture  with a chest x-ray showing no active disease and an EKG which was  unremarkable.  We at that point admitted her for open reduction and  internal fixation of her left hip and consult to the medical service for  medical management concerns for the possibility of withdrawal.   HOSPITAL COURSE:  The patient was taken to the operating room shortly  after admission for ORIF of her left hip.  She had a severely comminuted  fracture of the lateral side,  as well as on the medial side, which was  fairly unusual, probably related to the fact that she was in bed, trying  to move around, trying to walk with a fractured hip with her alcohol  abuse at home.  Certainly, we were able to get the 2 __________  fragments back together, but this was a __________  comminuted fracture.  Intraoperatively, she was noted to be quite oozy, I think related to the  fact that her coagulation status is somewhat affected by her chronic  alcohol use and abuse.  At any rate, we had the hospitalist come to see  her and basically put her on an Ativan withdrawal protocol, as well as  giving her nebulizers for her COPD.  They basically managed this  situation reasonably well.  She was placed on DVT prophylaxis with a  very light dose of Coumadin with a desire to keep her INR about 1.5 to  2.  We started with a 3 mg dose.  We did put her on the Ativan protocol  as outlined.  The morning after surgery, her hemoglobin dropped  quite  significantly to 6.9 and 20.2, and she was transfused with 2 units of  packed red blood cells.  Interestingly, this did raise her home up to  9.7, so it was unclear whether this was somewhat falsely low, but  certainly she had need for the blood at this point.  At this point, the  patient was seen postoperatively by physical therapy and was evaluated  by PT, OT and rehabilitation.  She was not felt to be a rehabilitation  candidate.  PT was moving somewhat slow with her.  She did have a little  bit of a low potassium on the postoperative day #2, and she was given  some runs of potassium via the medical service.  Ultimately, her  anticoagulation pathway was followed, and by the time of discharge, her  pro time was 24.0 and INR 2.0.  Her hemoglobin at the time of discharge  was 9.7 and 28.5 as of August 03, 2006.  Platelets had rebounded to 140,  which was her admitting platelet situation.  Her low potassium resolved  from 3.1 to 3.9 with her levels of potassium on August 02, 2006, and her  BUN and creatinine were 3 and 0.37 which were both low, at the time of  discharge glucose running at 93.  The patient, because of the severely  comminuted nature of the fracture, will need to be touchdown  weightbearing.  I think there will be some consolidation and compression  of the fracture site, and I think she is kind of shortened though the  fracture site, but I am hopeful, given the good nature of the bone, that  this will hold certainly long enough for her to heal.  In terms of her  alcohol abuse, I would continue her on her Ativan protocol for  withdrawal.  I do not think she needs any further potassium  supplementation.  I think that she does need to have aggressive  nutritional management, and I would certainly encourage her aggressively  to be eating.   MEDICATIONS AT DISCHARGE:  1. In terms of pain, MS Contin 30 mg twice a day with Dilaudid 4 mg      given q.3-4h. as needed for pain.   2. I think, in terms of other medications, the patient should be      continued on a Nicoderm patch, as she was taking this  for      withdrawal of the nicotine.  3. I think she should be on a Pancrease/Creon 10 mg capsule; that is      given 2 tablets three times a day.  4. Folic acid 1 mg daily.  5. Calcium carbonate and vitamin D3 one tablet twice a day.  6. Levalbuterol every 6 hours in an inhaled solution.  7. Atrovent inhaler q.6h. as needed.  These can be discharged as her      respiratory status resolves within the nursing home.  8. Multivitamin daily.  9. Thiamine 100 mg daily.  10.Ativan 1 mg q.12h. and Ativan 1 mg on a p.r.n. basis in addition to      the one q.12h. as needed.  11.Stool softener, Colace, twice a day.  12.Trinsicon capsules three times a day.  13.Warfarin per protocol to try to maintain an INR of 1.5 to 2.  I      think it is going to need to be probably 1 mg to 2 mg a day, and we      can let this be managed in the nursing facility.   She should be at this time discharged to the nursing facility.  Ultimately, she should be touch-down weightbearing only on the left  side.  If she is not able to maintain touchdown weightbearing because of  the nature of the comminution, I think that she should be  nonweightbearing.  I think she is understanding of this.  I think that  she should follow up with Dr. Jodi Geralds in his office in 2 weeks,  calling [phone] to set up an appointment.  Should there be any problems  as the patient arrives to the nursing facility relating to orthopedic  care, please call Dr. Jodi Geralds.  If she has problems with her medical  issues, please call [facility] hospitalists, as they have followed her in  the hospital.  At this point, she will be discharged to the nursing  facility with the following outline and should have some dressing  changes changed every couple of days and should have her staples removed  14-17 days postoperatively.   If there are any problems or further issues  related to her care, please do not hesitate to contact Dr. Luiz Blare at his  office at [phone].      Harvie Junior, M.D.  Electronically Signed     JLG/MEDQ  D:  08/03/2006  T:  08/03/2006  Job:  [id number]

## 2010-05-19 NOTE — Discharge Summary (Signed)
[name], [name]               ACCOUNT NO.:  [id number]   MEDICAL RECORD NO.:  [id number]          PATIENT TYPE:  INP   LOCATION:  1421                         FACILITY:  [facility]   PHYSICIAN:  Theodosia Paling, MD    DATE OF BIRTH:  August 12, 1950   DATE OF ADMISSION:  04/15/2008  DATE OF DISCHARGE:  04/17/2008                               DISCHARGE SUMMARY   PRIMARY CARE PHYSICIAN:  Lorelle Formosa, M.D.   CHIEF COMPLAINT:  Abdominal pain.   ADMITTING HISTORY:  Please refer to the admission note dictated by Dr.  Della Goo and the history of present illness.   DISCHARGE DIAGNOSES:  1. Acute flare of chronic pancreatitis.  2. History of cirrhosis.  3. History of alcohol abuse.  4. History of chronic obstructive pulmonary disease.  5. History of peptic ulcer disease.   DISCHARGE MEDICATIONS:  1. Percocet 5/325 mg p.o. q.6 h. p.r.n.  2. Xanax.   The patient is to continue home medications which are:  1. Ativan 1 mg p.o. q.8 h. p.r.n.  2. Zofran 8 mg p.o. q.6 h. P.r.n.  3. Reglan 20 mg p.o. daily as needed.   The patient is instructed to hold Dilaudid.   HOSPITAL COURSE:  The following issues were addressed during the  hospitalization:  Abdominal pain.  The patient was admitted to the  hospital, received IV fluids, bowel rest.  At the time of discharge, she  was hemodynamically stable, asymptomatic, pain was well controlled, kept  down the food, tolerated low-fat diet very well.   PROCEDURE PERFORMED:  None.   CONSULTATIONS PERFORMED:  None.   IMAGING PERFORMED:  X-ray of the abdomen did not show any acute  pathology.   DISPOSITION:  The patient will follow up with Dr. Ronne Binning in 1 week's  time.   Total time spent on discharge for this patient 45 minutes.      Theodosia Paling, MD  Electronically Signed     NP/MEDQ  D:  04/17/2008  T:  04/17/2008  Job:  [id number]   cc:   Lorelle Formosa, M.D.  Fax: [phone]

## 2010-05-19 NOTE — Discharge Summary (Signed)
NAMEFANNY, [name]               ACCOUNT NO.:  [id number]   MEDICAL RECORD NO.:  [id number]          PATIENT TYPE:  INP   LOCATION:  3741                         FACILITY:  [facility]   PHYSICIAN:  Altha Harm, MDDATE OF BIRTH:  November 28, 1950   DATE OF ADMISSION:  08/21/2007  DATE OF DISCHARGE:  08/25/2007                               DISCHARGE SUMMARY   DISCHARGE DISPOSITION:  Home.   FINAL DISCHARGE DIAGNOSES:  1. Acute pancreatitis, resolved.  2. Chronic benzodiazepine use.  3. Chronic pancreatitis.  4. Chronic pain.  5. Narcotic-seeking behaviors.  6. Noncompliance with medical care.  7. Cirrhosis of the liver.  8. History of chronic bronchitis.  9. Tobacco and alcohol abuse.  10.History of peptic ulcer disease.  11.History of renal cell carcinoma.   DISCHARGE MEDICATIONS:  1. Lorazepam 1 mg p.o. t.i.d. p.r.n.  2. Zofran 4 mg p.o. t.i.d. p.r.n.  3. Reglan 10 mg p.o. every 6 hours p.r.n.  4. Hydromorphone 4 mg p.o. every 6 hours p.r.n.   CONSULTANTS:  None.   DIAGNOSTIC STUDIES:  1. Chest x-ray, done on admission, which showed severe COPD.  No      active cardiopulmonary disease.  2. CT abdomen and pelvis on 08/18, which shows no acute process in the      abdomen.  3. Borderline intrahepatic and mild extrahepatic biliary ductal      dilatation.  Suspect slight progression since prior exam.  4. Left renal RFA defect.  Probable hemorrhage or proteinaceous debris      posteriorly.  Recommend attention and followup.  No evidence of      metastatic disease.  5. CT of the pelvis shows evidence of hysterectomy with no acute      pelvic process.   LABORATORY STUDIES:  At the time of discharge, lipase 172.   CHIEF COMPLAINT:  Abdominal pain times one day.   HISTORY OF PRESENT ILLNESS:  Please see the H and P, dictated by Dr.  Christella Noa for details of the HPI.   HOSPITAL COURSE:  The patient was admitted and given bowel rest and IV  hydration for treatment of her  acute pancreatitis.  The patient had slow  improvement in her lipase.  She was started on clear liquids and  advanced to a low-fat diet.  The patient tolerated her diet well and had  no further emesis.  The patient still has some degree of pain, which she  says is completely controlled by her oral pain medications.  Thus,  despite a mild increase in her lipase, the patient is being discharged.  In addition to that, the patient has been grossly noncompliant with her  care during the hospitalization.  The patient has persisted in leaving  the floor and, over the last 24 hours, has actually left the grounds of  the hospital with her PICC line and IV in tow.  She was observed by  hospital personnel on their way to and from work to be out of the  hospital, sitting in a bus shed off the hospital campus.  The patient  has left the floor unbeknownst  to the nurses.  We have had security to  look for the patient on the hospital grounds and then the patient would  just suddenly show back up on the floor.  This has been discussed with  the patient at length.  However, she still persists, and at this time we  are unable to provide safe care for the patient due to her persistence  in noncompliance with staying in the hospital.   The patient is requesting prescriptions for pain medications.  However,  I have reviewed her dispensing of pain medications via the pharmacy  record, where she has her medications filled, and the patient had 100  pills of oral Dilaudid dispensed on July 26.  The patient has been in  the hospital since August 17.  Thus, she has only used medications from  July 26 up through August 16.  Thus, this should leave the patient with  more than enough medications to satisfy her pain needs until she can see  her primary care physician. Thus the patient will not be given a  prescription for any further pain medication, as based upon the  dispensing records, the patient should have an  adequate supply of them  based on the last dispensation.  This patient has clearly drug-seeking  behaviors, noncompliance and I would not be surprised if the patient is  also using illicit substances.  The patient is being referred back to  her primary care physician, Dr. Ronne Binning, for chronic medication use.   At this time, the patient is medically stable.  Her vital signs are as  follows.  Temperature is 98, blood pressure 127/92, heart rate 72,  respiratory rate 15, O2 sats are 99% on room air.  She has some mild  epigastric tenderness.  Otherwise, her abdomen is benign.  Her lungs are  clear to auscultation.  Cardiovascular:  She has a normal S1 and S2.  No  murmurs, rubs or gallops are noted.  The patient is ambulatory without  any difficulty and she is eating and tolerating a diet without any  nausea or vomiting.   DIETARY RESTRICTIONS:  The patient should be on a low-fat, low-sodium  diet.   PHYSICAL RESTRICTIONS:  Activity as tolerated.   Please note that I have attempted to call Dr. Ronne Binning at his office  number, have been unable to reach him.      Altha Harm, MD  Electronically Signed     MAM/MEDQ  D:  08/25/2007  T:  08/25/2007  Job:  [id number]   cc:   Dr. Ronne Binning

## 2010-05-19 NOTE — Consult Note (Signed)
NAMEALABAMA, DOIG               ACCOUNT NO.:  [id number]   MEDICAL RECORD NO.:  [id number]          PATIENT TYPE:  INP   LOCATION:  1603                         [facility]ACILITY:  [facility]   PHYSICIAN:  Mobolaji B. Corky Downs, M.D.DATE OF BIRTH:  [birth year]   DATE OF CONSULTATION:  07/31/2006  DATE OF DISCHARGE:                                 CONSULTATION   PRIMARY CARE PHYSICIAN:  Lorelle Formosa, M.D.   PRIMARY CARE PHYSICIAN:  Harvie Junior, M.D.   REASON FOR CONSULTATION:  Management of medical problems including  alcohol abuse, chronic pancreatitis, COPD.   HISTORY OF PRESENT ILLNESS:  Ms. Rolston is a 60 year old Caucasian  female who has multiple medical problems as listed below.  She was in  her usual state of health until about 7 p.m. last night when she fell  and subsequently was found to have a comminuted left intertrochanteric  fracture with varus angulation.  She has undergone open reduction and  internal fixation.  She was also noted on the x-rays to have diffuse  osteopenia.   Before the fall, the patient denied dizziness or syncope.  There was no  prodromal symptoms.  She did not lose consciousness after the fall.  She  stated that she felt funny but could not really describe what it was.  She thinks it was vertigo.  The patient's history at this point is  unreliable as she is dozing off during the interview secondary to  Dilaudid.  Hence, reliable history was unobtainable at this time.   REVIEW OF SYSTEMS:  Unobtainable at this time due to drowsiness after  receiving pain medication.   PAST MEDICAL HISTORY:  1. Chronic pancreatitis.  2. Tobacco abuse.  3. Alcohol abuse.  4. Chronic abdominal pain syndrome.  5. Gastroparesis.  6. Chronic anxiety for which she has been on Ativan.  7. COPD.  8. History of peptic ulcer disease.  She is status post partial      gastrectomy.  9. History of alcohol-induced seizures.   PAST SURGICAL HISTORY:  1. Hysterectomy.  2.  Partial gastrectomy for peptic ulcer.   CURRENT MEDICATIONS:  1. Ativan 1 mg 3 times a day.  2. Dilaudid 2 mg 1-2 q. 4-6 h p.r.n.  3. Prilosec 20 mg daily.  4. Creon 10 one to two capsules with meals.  5. Nicotine patch.   Current medications while in the hospital include  1. Dilaudid full dose PCA.  2. Colace 100 mg b.i.d.  3. Trinsicon capsule 1 p.o. 3 times a day.  4. Coumadin.  5. Tylenol p.r.n.  6. Robaxin 500 p.o. or IV q. 6 h. p.r.n.  7. Ambien 5-10 mg nightly.  8. Reglan 10 mg p.o. nightly p.r.n. nausea.  9. Zofran 5 mg p.o. or IV q. 6 h. p.r.n. nausea.   ALLERGIES:  The patient is allergic to TERAZOSIN.   FAMILY HISTORY:  Both parents are deceased.  Mother passed away 6 months  ago from old age.  Father had pancreatitis.  He was an alcoholic.  The  patient has 2 children, and she is estranged from them.  SOCIAL HISTORY:  She is married, lives with her husband.  She is on  disability because of chronic pancreatitis.  She smokes 1-1/2 packs of  cigarettes a day, and she drinks one beer every day.   PHYSICAL EXAMINATION:  VITAL SIGNS:  Blood pressure 136/78, pulse rate  between 90-110, respiratory rate 16, O2 saturation 96%.  GENERAL:  The patient is awake but drowsy, difficulty maintaining  attention.  Not in respiratory distress.  HEENT:  Normocephalic.  She appears cachectic.  (This is not new.)  Mucous membranes dry.  NECK:  No carotid bruit, no elevated JVD.  LUNGS:  No wheeze or rhonchi.  Diminished air entry lung bases.  CARDIOVASCULAR:  S1 and S2 normal.  No gallop or rub.  ABDOMEN:  Not distended, soft, nontender.  Bowel sounds present.  EXTREMITIES:  No pedal edema, calf tenderness.  Dorsalis pedis pulses 2+  bilaterally.  CNS:  No focal neurological deficits.  MUSCULOSKELETAL:  Left incision site is clean.   LABORATORY DATA:  Sodium 138, potassium 4.0, chloride 106, glucose 94,  BUN 10, creatinine 0.54, calcium 8.5.  PT 12.7, INR 0.9.  White cell   10.5, hemoglobin 11.8, hematocrit 35, platelets 142, neutrophils 85%,  absolute neutrophil count 8.9.  Urinalysis: Specific gravity 1.008,  negative for nitrites and leukocytes.   Chest x-ray showed stable changes of COPD and chronic bronchitis.  No  acute abnormality.   X-ray of the left hip showed comminuted intertrochanteric fracture with  varus angulation. One shaft with anterior displacement of the distal  fragment, osteopenia.   EKG shows normal sinus rhythm without atrial enlargement.   ASSESSMENT AND RECOMMENDATIONS:  1. Ms. Pieper is a 60 year old Caucasian female with known history of      alcohol abuse.  She appears to be on vacillating alcohol use.  She      is at high risk for alcohol withdrawal while in the hospital.  I do      agree with Ativan withdrawal protocol.  However, will adjust the      dosage to avoid over sedation in view of the fact that the patient      is also using Dilaudid.  It is also compelling to give her Ativan      as she was on Ativan prior to this hospitalization.  2. Left hip fracture.  Management per primary team.  Will add calcium      with vitamin D 500 mg b.i.d.  The patient does have osteopenia as      noted on x-rays. She will require DEXA scan as an outpatient with      her primary care physician, and consideration can then be given to      starting bisphosphonate if indicated.  3. Chronic obstructive pulmonary disease.  This appears stable.  Will      give Xopenex and Atrovent q. 6 h and p.r.n.  4. Tobacco abuse.  Will continue nicotine patch.  The patient does not      appear to be willing to quit.  Will again offer tobacco cessation      counseling.  5. Chronic pancreatitis.  The patient does use Dilaudid p.r.n. at      home.  She is currently on Dilaudid PCA pump. Will continue with      this and resume Creon 10.   Thank you for the consult.  We will follow with you.      Mobolaji B. Corky Downs, M.D.  Electronically Signed  MBB/MEDQ  D:  07/31/2006  T:  07/31/2006  Job:  [id number]   cc:   Lorelle Formosa, M.D.  Fax: [phone]   Harvie Junior, M.D.  Fax: [phone]

## 2010-05-19 NOTE — H&P (Signed)
NAMEAVAYA, [name]               ACCOUNT NO.:  [id number]   MEDICAL RECORD NO.:  [id number]          PATIENT TYPE:  INP   LOCATION:  3741                         FACILITY:  [facility]   PHYSICIAN:  Herbie Saxon, MDDATE OF BIRTH:  12/30/1950   DATE OF ADMISSION:  08/21/2007  DATE OF DISCHARGE:                              HISTORY & PHYSICAL   PRIMARY CARE PHYSICIAN:  Dr. Ronne Binning.   She is a full code.  She has no assigned health care power of attorney.   PRESENTING COMPLAINT:  Abdominal pain, one day.   HISTORY OF PRESENTING COMPLAINT:  This is a 60 year old Caucasian female  woke up at 4 a.m. this morning complaining of severe 10/10 epigastric  pain, not radiating, associated with nausea, 1 episode of dark-colored  vomiting.  She denies any fever, jaundice, hematuria, or melena stool.  The patient was run out of Dilaudid, which she uses regular for chronic  abdominal pain a day previous.  The patient denies any shortness of  breath, chest pain, or palpitation.  No headache or dizziness.  I have  blood pressure at presentation at the [facility] was severely  elevated at 178/126 with a heart rate of 140.  The patient denies any  joint swelling, pedal swelling, or facial puffiness.  She denies any  dysuria, hematuria, urgency, or frequency of urination.  The patient has  lost 10 pounds in the last 3 weeks.   PAST MEDICAL HISTORY:  1. Liver cirrhosis.  2. Chronic bronchitis.  3. Tobacco and alcohol abuse.  4. COPD.  5. Peptic ulcer disease.  6. Anxiety.  7. Renal cell carcinoma.   SOCIAL HISTORY:  The patient is a very heavy drinker, used to binge on  alcohol daily.  She still occasionally sneaks to drink one bottle or two  of beer.  She used to smoke 2 packs per day.  The patient still smokes 1  pack per day.  She has smoked for more than 20 years of cigarettes.   PAST SURGICAL HISTORY:  Gastrectomy 4 years ago by Dr. Talmage Nap, left hip  surgery in July 2008,  total abdominal hysterectomy, and renal cell  cancer ablation by Dr. Fredia Sorrow on January 18, 2007.   FAMILY HISTORY:  Brother has bronchitis and one brother has  hypertension.   REVIEW OF SYSTEMS:  A 14-point review of systems done.  Pertinent  positives as per the history of presenting complaints.   ALLERGIES:  THORAZINE.   MEDICATIONS:  Dilaudid, Ativan, Zofran, and Prilosec.   PHYSICAL EXAMINATION:  GENERAL:  On examination, she is a middle-aged  lady, chronically ill looking, cachectic.  VITAL SIGNS:  Temperature is 98, pulse is 122, respiratory rate is 20,  and blood pressure 198/132.  HEENT:  Pupils are equal, reactive to light and accommodation.  Extraocular muscles are intact.  Head is atraumatic and normocephalic.  Mucous membranes are moist.  Oropharynx and nasopharynx are clear.  NECK:  Supple.  There is no adenopathy or elevated JVD.  CHEST:  Clinically clear.  ABDOMEN:  There is a midline scar in the abdomen with marked  tenderness  and rebound in epigastric area.  No organomegaly palpable.  No renal  angle tenderness.  Inguinal orifices are patent.  NEUROLOGIC:  She is alert and oriented to time, place, and person.  No  neurologic deficits.  EXTREMITIES:  Peripheral pulses present.  No pedal edema.  No edema,  cyanosis, or clubbing.   LABORATORY DATA:  WBCs 11, hematocrit 29, platelet count 226.  Chemistry, sodium is 132, potassium 4.6, chloride 99, bicarbonate 20,  BUN 12, creatinine 0.7, and glucose is 159.   ASSESSMENT:  1. Acute-on-chronic bronchitis.  2. Hypertension.  3. Myocardial infarction.  4. Leukocytosis.  5. Uncontrolled diabetes.  6. Hyponatremia.  7. Tachycardia.  8. History of renal cell carcinoma.  9. Liver cirrhosis.  10.Ongoing tobacco and alcohol abuse.   The patient will be admitted to Telemetry bed.  We will put on n.p.o.  IV fluids normal saline 100 mL an hour.  Obtain  thyroid function test.  Obtain chest x-ray, EKG, LFT, and  Hemoccult x3.  We will also put on bed  rest.  O2 2 liters supplementally.  Lovenox 40 mg subcu daily for DVT  prophylaxis, Protonix 40 IV q.12 h.  Consider GI evaluation.  Dilaudid 1  mg IV q.4 h. p.r.n., labetalol 20 mg IV start, metoprolol 50 mg p.o.  b.i.d., clonidine 5.2 mg q. weekly, Ativan 1 mg IV q.6 h. p.r.n.  She  will be on a nicotine patch at 21 mg per day.  Thiamine and folate to be  supplemented.  The patient was counseled on tobacco cessationduring  the  substance abuse counseling.      Herbie Saxon, MD  Electronically Signed     MIO/MEDQ  D:  08/21/2007  T:  08/22/2007  Job:  [id number]

## 2010-05-19 NOTE — H&P (Signed)
[name], [name]               ACCOUNT NO.:  [id number]   MEDICAL RECORD NO.:  [id number]          PATIENT TYPE:  INP   LOCATION:  1417                         FACILITY:  [facility]   PHYSICIAN:  Maryla Morrow, MD        DATE OF BIRTH:  10-Oct-1950   DATE OF ADMISSION:  06/26/2008  DATE OF DISCHARGE:                              HISTORY & PHYSICAL   PRIMARY CARE PHYSICIAN:  Lorelle Formosa, M.D.   CHIEF COMPLAINT:  Cough.   HISTORY OF PRESENT ILLNESS:  [name] is a 60 year old very pleasant  lady with a past medical history of COPD, liver cirrhosis, alcoholism,  chronic pancreatitis, and polysubstance abuse who presented to the ED at  [facility] with a chief complaint of abdominal pain and nausea followed  by palpitations with heart racing.  On arrival in the ED, she was found  to have a heart rate in the 190's and strips evidently showed her to be  in SVT.  She was successfully cardioverted with a dose of Adenosine, I  believe was 6 mg given by  Dr. Clarene Duke, which spontaneously converted  her to a normal sinus rhythm with resolution of her symptoms.  She did,  however, complain of wheezing and cough, which has been going on for the  last 7 days.  She also has an associated loss of appetite.  She also  admits to the fact that she was drinking beer just to see if that  relieved the tachycardia/palpitations without any benefit.  She denies  any fever, chills, sick contacts.  She claims to be compliant with her  medications, including alcohol.   HOME MEDICATIONS:  1. Ativan 1 mg t.i.d.  2. Dilaudid 4 mg t.i.d. as needed.  3. Nexium 40 once daily.  4. Zofran 8 mg q.6h.  5. Advair 250/50 1 puff b.i.d.  6. Albuterol 3 puffs daily.   ALLERGIES:  Patient is allergic to ASPIRIN and THORAZINE, reaction  unknown.   PAST MEDICAL HISTORY:  1. COPD.  2. Anxiety.  3. Chronic pain.  4. Liver cirrhosis.  5. Gastroesophageal reflux disease.  6. Chronic pancreatitis.  7. Polysubstance  abuse.   PAST SURGICAL HISTORY:  1. Hysterectomy.  2. Partial gastrectomy.  3. Left hip surgery.   SOCIAL HISTORY:  Positive for patient smoking a pack of cigarettes a day  as well as a heavy drinker.  She is married.  She denies any drug abuse.  She is currently disabled from her chronic medical problems.   REVIEW OF SYSTEMS:  For all pertinent positives and negatives, see HPI,  otherwise complete 12-point review of systems perform and negative.   PHYSICAL EXAMINATION:  A 60 year old female alert to time, place, and  person in no acute distress, who smells of alcohol.  Temperature 98.4, respirations 20 per minute, pulse 117, which is down  to the 90's.  Blood pressure is 131/94.  HEENT:  Pupils are equal, round and reactive to light.  Extraocular  movements are intact.  No JVD.  No lymphadenopathy.  Head is  normocephalic and atraumatic.  Oropharynx is clear.  CHEST:  On auscultation, bilateral expiratory wheezing, unlabored  breathing.  HEART:  Regular rate and rhythm.  Tachycardic.  No murmur, rub or  gallop.  ABDOMEN:  Soft with mild tenderness in the epigastrium.  Positive bowel  sounds.  EXTREMITIES:  No clubbing, cyanosis or edema.  Dorsalis pedis pulses are  2+ bilaterally.  Strength is 5/5 in upper and lower extremities.  Sensation is intact.  Speech normal.  PSYCHIATRIC:  Mood and affect are symmetric.   PERTINENT LABORATORY/DIAGNOSTIC DATA:  Chemistry profile was  unremarkable.  Creatinine 0.6.  Sodium 142, potassium 4, AST 28, ALT  less than 6.  Lipase 80.  Alcohol level 176.  Cardiac enzymes, first  set, negative.  CBC:  White count 6, hemoglobin 13.5, platelets 154.   Chest x-ray revealed no acute cardiopulmonary disease but had  hyperinflated lung fields.   ASSESSMENT/PLAN:  [name] is a very pleasant 60 year old white female  with:  1. Chronic obstructive pulmonary disease exacerbation further      complicated by active tobacco abuse.  2.  Supraventricular tachycardia converted to normal sinus rhythm with      Adenosine, probably that is an isolated event related to her      chronic obstructive pulmonary disease exacerbation.  3. Alcohol abuse.  4. History of liver cirrhosis.  5. History of gastroesophageal reflux disease.  6. Chronic pain syndrome.   PLAN:  1. Admit to telemetry.  2. IV steroids as well as IV fluids.  3. CIWA protocol with Ativan.  4. Will do a 2D echo in the a.m. regarding SVT and p.r.n. cardiology      consult if SVT recovers.  5. P.R.N. medications and PPI prophylaxis.  6. Place patient on empiric Avelox for her COPD as well as DuoNebs.  7. Check a.m. labs.      Maryla Morrow, MD  Electronically Signed     AP/MEDQ  D:  06/26/2008  T:  06/27/2008  Job:  [id number]   cc:   Lorelle Formosa, M.D.  Fax: [phone]

## 2010-05-22 NOTE — Discharge Summary (Signed)
[facility]  Patient:    [name], [name]                      MRN: [id number] Adm. Date:  [id number] Disc. Date: [id number] Attending:  Annamarie Dawley Dictator:   Arts development officer, N.P.                           Discharge Summary  HISTORY OF PRESENT ILLNESS:  Ms. Smet is a 60 year old married Caucasian female who was voluntarily admitted for detoxification.  Patients chief complaint was "I just want to get off these pills.  Im not a drug addict. These are prescribed for me but I want to get off of them."  Patient reports abuse of Ativan and Percocet.  She reports she wants to get off the Percocet, which she takes up to approximately 10 a day which she has been taking and they were originally prescribed for abdominal pain secondary to chronic pancreatitis.  Also reports abusing Ativan 1 mg which was prescribed for her "nerves."  She was prescribed 1 mg p.o. t.i.d. and has been using approximately 5 mg per day.  Last used her drugs April 21, 2000.  Also there has been abuse of Dilaudid 2 mg, using 2 or 3 per day, as suppositories, which are also prescribed for the pancreatitis.  Patient reports decreased appetite for 1-1/2 months with a 27-pound weight loss, decreased sleep, averaging 4-5 hours a night.  Admits to feeling down and depressed but without any thoughts of suicide or homicide.  There were no psychotic symptoms but marked anhedonia over the past 2-4 months.  PAST PSYCHIATRIC HISTORY:  The patient has been followed by the [facility] but stopped going there many years ago.  Currently has no outpatient psychiatric.  She has had inpatient [facility] hospitalizations at [facility] and [facility] in 1997. She has had three previous suicide attempts during the 25s.  PRIMARY CARE PHYSICIAN:  Dr. Hessie Diener at [facility].  MEDICAL PROBLEMS:  Chronic pancreatitis, chronic bronchitis, COPD,  status post gastric ulcer resection in January of 2002.  ADMISSION MEDICATIONS:  Ativan 1 mg p.o. t.i.d., Percocet tabs 1-2 q.4h. p.r.n. for pain, Dilaudid 2 mg 1 per day suppositories, Nexium 20 mg 1 q.d., Phenergan 25 mg t.i.d. p.o. or suppositories, Serevent 2 puffs b.i.d., Flovent 2 puffs b.i.d.  ALLERGIES:  She reports being allergic to THORAZINE.  PHYSICAL EXAMINATION:  Patient was seen in the emergency room at [facility] April 21, 2000 for complaints of chest pain.  Her chest found at time was negative and no myocardial infarction was found on that visit.  Patients weight, on admission, was 109 pounds with a baseline at 135 pounds.  Vital signs showed a pulse of 35 and blood pressure at 129/103.  LABORATORY FINDINGS:  Her potassium was low at 3.2, RBCs low at 3.83, hemoglobin low at 11.8, hematocrit low at 34.9, amylase and lipase levels were within normal limits.  CK and CKMB were within normal limits.  ADMITTING MENTAL STATUS EXAM:  Small statured, thin white female, disheveled and fatigued in appearance but she is calm and cooperative.  Polite.  Affect is mildly anxious and blunted.  Speech normal in pace and tone.  Mood depressed and anxious.  No suicidal ideation.  No homicidal ideation.  Thought processes are logical and coherent without evidence of psychosis.  She  tends to minimize drug dependence, stating "I only take a few more than are prescribed for me and these are prescribed drugs."  She is alert and oriented. Cognitive function intact.  ADMITTING DIAGNOSES: Axis I:    1. Opiate dependence.            2. Benzodiazepine dependence.            3. Major depression, recurrent, moderate. Axis II:   Deferred. Axis III:  1. Status post gastric resection for ulcer.            2. Chronic obstructive pulmonary disease.            3. Chronic pancreatitis. Axis IV:   Moderate (related to recognizing/dealing with her substance             abuse/dependence). Axis V:    Current Global Assessment of Functioning 45; highest in the past            year 55.  HOSPITAL COURSE:  The patient was admitted to the unit for detoxification from opiates and benzodiazepines.  We did start the Wytensin protocol.  We also continued her Nexium 20 mg 1 p.o. q.d., Phenergan 25 mg 1 p.o. p.r.n. q.4h., Serevent 2 puffs b.i.d., Flovent 2 puffs b.i.d., Klonopin 1 mg q.a.m. and q.h.s. and Seroquel 25 mg p.o. q.4h. p.r.n.  We did give her K-Dur 20 mg one time only and added Celexa 10 mg p.o. daily and we forced fluids, offered Gatorade.  On April 23, 2000, we upped her Seroquel to 25 mg p.o. q.i.d. and added Remeron 15 mg SolTabs p.o. q.h.s. and we finally went up to Seroquel 50 mg p.o. q.i.d. and increased the Nexium to 40 mg p.o. b.i.d. and gave her Compazine 10 mg p.o. IM q.i.d. p.r.n. and she was considered a fall risk based on her nausea, vomiting, weakness and medications.  We added Carafate 1 g q.i.d. and then changed the Compazine to 10 mg p.o. IM q.3h. p.r.n.  We started giving her K-Dur 20 mEq b.i.d.  On the 25th, we stopped Celexa.  We had home-health orders to assess patient for signs and symptoms of dehydration.  If dehydrated, give a little more saline with 10 mEq potassium at 100 cc and hour for 24 hours; then reassess and can repeat in 24 hours. While patient was in the hospital, she complained of not feeling well, saying the medication was not helping.  Reports not sleeping well.  Appetite was low. Said her muscles hurt and she cannot be still, asking for p.r.n. Librium. Still depressed.  Then we did put her on a low-dose of Seroquel and added Remeron but continued the Wytensin protocol, Celexa and Klonopin.  By that time, she was irritable.  At times, she stayed in bed with her eyes closed. Refused to open her eyes or to come to the consult room for interviews.  She continued to report nausea and vomiting and then still had cravings  for medications.  Her sleep was better and she was attempting to take clear fluids during the day.  On the 23rd, she was still in bed complaining of nausea and  vomiting.  Reports not sleeping or eating well.  Still has some cravings for drugs.  Denies suicidal ideation.  We changed Phenergan to Compazine, increased the Nexium and continued the detox.  Again, the patient continued to feel anxious and continued to have nausea and vomiting, sleeping and eating poorly, mood depressed, affect sad.  Patient was  withdrawn and we continued with the Compazine and Klonopin.  Patient did not attend groups and basically was noncompliant with any treatment on the unit.  On the 25th, the patient continued to have nausea and vomiting but this has been a recurrent medical problem and does not appear related to any withdrawal.  She was less depressed and denied any suicidal ideation.  Sleeping fair.  We decided to keep her off Celexa and felt like that she could be discharged today since she is no longer considered a danger to self or others.  CONDITION ON DISCHARGE:  Patient discharge in improved condition with improvement in mood, sleep and appetite.  No suicidal or homicidal ideation. Improvement in energy.  She continued to have medical problems, i.e. nausea and vomiting, which we felt should be dealt with by her medical doctor and did not appear to be related to detox.  She was detoxed successfully.  DISPOSITION:  The patient is discharged to home.  FOLLOW-UP:  She has an appointment with [facility] May 05, 2000 at 10 a.m. with the reentry group, Harolyn Rutherford.  We also referred her to [facility] May 19, 2000 at 8:30 a.m. with Dr. Dionicia Abler.  DISCHARGE MEDICATIONS: 1. Remeron 15 mg SolTabs 1 tab q.h.s. 2. Seroquel 25 mg 2 tabs q.i.d. 3. Klonopin 1 mg 1 tab b.i.d. 4. Phenergan 25 mg 1 tab q.4h. p.r.n. 5. Flovent 2 puffs b.i.d. 6. Serevent 2 puffs b.i.d. 7.  Carafate 1 g 1 tablet q.i.d. 8. Nexium 40 mg 1 tab q.d.  DISCHARGE RECOMMENDATIONS:  We also reported that she would not be able to work for at least one week.  FINAL DIAGNOSES: Axis I:    1. Opiate dependence.            2. Benzodiazepine dependence.            3. Major depression, recurrent. Axis II:   Deferred. Axis III:  1. Status post gastric resection for ulcer.            2. Chronic obstructive pulmonary disease.            3. Chronic pancreatitis. Axis IV:   Mild (related to her substance abuse/dependence and medical            problems). Axis V:    Current Global Assessment of Functioning at discharge 52, highest            in the past year 55. DD:  05/06/00 TD:  05/08/00 Job: [id number]
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
## 2010-05-22 NOTE — Discharge Summary (Signed)
[facility]  Patient:    [name], [name]                      MRN: [id number] Adm. Date:  [id number] Disc. Date: 10/23/99 Attending:  Eino Farber Dictator:   Dionne D. Su Hilt, N.P.                           Discharge Summary  ADMISSION DIAGNOSES: 1. Acute recurrent pancreatitis with vomiting and syncope. 2. Pyloric peptic ulcer disease. 3. Gastroesophageal reflux disease. 4. Myofascial pain syndrome. 5. Chronic obstructive pulmonary disease with asthma. 6. Depression.  DISCHARGE DIAGNOSES: 1. Acute pancreatitis recurrent with nausea and vomiting improving. 2. Pyloric peptic ulcer disease. 3. Gastroesophageal reflux disease, history of. 4. Chronic obstructive pulmonary disease with asthma. 5. Myofascial pain syndrome. 6. Depression.  REASON FOR HOSPITALIZATION:  The patient is a 60 year old white female with a known history of acute recurrent pancreatitis and peptic ulcer disease who is admitted with complaints of uncontrollable nausea and vomiting with severe epigastric pain uncontrolled with Phenergan rectal suppository, and passing out after vomiting.  ALLERGIES:  No known drug allergies.  LABORATORY DATA:  On October 20, 1999, WBC was 7.2, hemoglobin 14.7, hematocrit 42.4, platelets 281.  Chemistry studies on October 20, 1999, sodium 135, potassium 4.1, chloride 107, CO2 21, glucose 103, BUN 14, creatinine 0.9, calcium 9.8, total protein 7.6, albumin 3.8, AST 20, ALT 13, ALP 95, total bilirubin 0.3, amylase 59, lipase 13.  EKG on October 20, 1999, shows sinus tachycardia, nonspecific T-wave abnormality.  On October 20, 1999, abdominal x-rays shows findings suggesting an element of constipation without clear focal abnormality of the abdomen noted.  There are two surgical clips in the right lower abdomen as previously seen.  HOSPITAL COURSE:  The patient was admitted.  IV therapy was initiated.  EKG was obtained.  The patient  was started on clear liquids.  She had a central line inserted.  She was given Dilaudid 2 mg IV with Phenergan for severe pain. Pepcid was initiated.  The patient was also given Ativan 1 mg p.o. q.8h. to assist with her insomnia.  She was started on Restoril at night.  Her diet did advance to a soft, no caffeine, low fat diet.  Due to her syncope, orthostatic blood pressures were obtained.  Her Dilaudid was decreased to 1.5 mg with Phenergan 25 mg.  She was started on advair 100/50, and she continued to have her Dilaudid taper where she would have used Vicodin before using Dilaudid. She had outside consult, and was started on Remeron 7.5 mg p.o. q.h.s.  She was given albuterol metered dose inhaler one or two puffs p.r.n. for chest tightness.  Pepcid was discontinued.  She was started on Protonix 40 mg p.o. q.d.  CONDITION ON DISCHARGE:  The patient is discharged home with improvement. Still complaining of some nausea, but no vomiting.  DISCHARGE MEDICATIONS: 1. Advair 100/50 punch and inhale q.a.m. and q.h.s. 2. Lorcet Plus one q.6h. p.r.n. pain. 3. Albuterol nebulizer 2 puffs q.4h. p.r.n. chest tightness. 4. Ativan 1 mg tab p.o. q.8h. 5. Remeron 15 mg tab one q.h.s. 6. Phenergan 25 mg tab one a.c. meals t.i.d. for nausea. 7. Phenergan suppository 25 mg insert q.6h. p.r.n. for nausea or vomiting. 8. Nexium 40 mg caps one q.d.  ACTIVITY:  Ambulate as tolerated.  DIET:  Low fat, no caffeinated beverages.  SPECIAL INSTRUCTIONS:  ______ [phone] November 12, 1999, at 2 p.m. Call office for November 2001 appointment.  Keep mental health appointment as above. DD:  12/03/99 TD:  12/03/99 Job: [id number]

## 2010-05-22 NOTE — Consult Note (Signed)
[name], [name]                         ACCOUNT NO.:  [id number]   MEDICAL RECORD NO.:  [id number]                   PATIENT TYPE:  INP   LOCATION:  5532                                 FACILITY:  [facility]   PHYSICIAN:  Petra Kuba, M.D.                 DATE OF BIRTH:  04/03/1950   DATE OF CONSULTATION:  07/04/2003  DATE OF DISCHARGE:                                   CONSULTATION   HISTORY:  The patient seen at the request of Dr. Lendell Caprice for iron  deficiency.  She has a long history of GI problems and has been dismissed  from both our group in the past as well as the [facility].  Based on being on  call for the emergency room, I was asked to see her.  I have never seen her  before but has seen Dr. Matthias Hughs and Dr. Luther Parody a few years back.  Sounds  like Lina Sar was her most recent gastroenterologist.  She used to be a  heavy drinker and had multiple admissions with pain, nausea, vomiting, and  probable pancreatitis.  No recent studies have been done.  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  She has  continued to have multiple GI symptoms.  Has lost some weight.  The last  couple of weeks has seen some bright red blood per rectum.  Presented to the  emergency room, was found to be significantly iron deficient despite being  guaiac-negative and was admitted for further workup and plans.  Her last  colonoscopy she believes was Dr. Matthias Hughs who did it in March of 2001 and her  last endoscopy was probably Dr. Juanda Chance which was almost 2 years ago.  She  may have had a CAT scan last year where the pancreas was normal.  No other  abnormalities were seen.   PAST MEDICAL HISTORY:  Pertinent for the chronic abdominal complaints  mentioned above.  She has had a hysterectomy and some benign breast lesions.   ALLERGIES:  THORAZINE   CURRENT MEDICATIONS:  Ativan, Vicodin, Protonix, and Zofran.   FAMILY HISTORY:  Pertinent for multiple alcoholic  pancreatitis in the family  but no other GI problems.   SOCIAL HISTORY:  Quit drinking 8 months ago.  Her husband confirms that.  She continues to smoke.  Rarely will use a Goody's powder but did recently  for a headache.   REVIEW OF SYSTEMS:  Negative except above.   PHYSICAL EXAMINATION:  GENERAL:  The patient looks well walking in the  hallway.  Not examined today.   LABORATORY DATA:  Pertinent for normal amylase, lipase and liver tests.  Hemoglobin was pertinent for 7.4 with an MCV of 76, normal white count and  platelet count.  Ferritin was 8.  Coags were normal.  BUN 11. creatinine  0.6.  Liver test normal.  Amylase, lipase mentioned  above and again normal.  KUB normal.   ASSESSMENT:  1. 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  2. Chronic nausea, vomiting, abdominal pain--questionable etiology.  3. Bright red blood per rectum--probably due to hemorrhoids.   PLAN:  Okay to proceed with outpatient workup since guaiac-negative and the  patient wants to eat and agrees with that plan.  She has been instructed to  call my nurse from 9 to 5 tomorrow or Tuesday and we can set up an  appointment for later next week.  Probably will need a repeat endoscopy,  colonoscopy, and possibly repeat CAT  scan, possibly even a small-bowel follow-through to rule out significant  adhesions.  Please call me tomorrow if not discharged and Dr. Madilyn Fireman is on  call for me this weekend.  Otherwise we will just plan to see her as an  outpatient.  She has been instructed not to go back to drinking as well as  not to take any aspirin, Goody's, BC's, Advil, etc.                                               Petra Kuba, M.D.    MEM/MEDQ  D:  07/04/2003  T:  07/05/2003  Job:  [id number]   cc:   Lorelle Formosa, M.D.  [phone] [address]  [place]  [place] [address]  Fax: [phone]   Corinna L. Lendell Caprice, MD

## 2010-05-22 NOTE — Discharge Summary (Signed)
[place]. [facility]  Patient:    [name], [name]                      MRN: [id number] Adm. Date:  [id number] Disc. Date: [id number] Attending:  Eino Farber Dictator:   Dionne D. Su Hilt, N.P.                           Discharge Summary  ADMISSION DIAGNOSES: 1. Acute pancreatitis, recurrent, with vomiting. 2. Pyloric channel peptic ulcer disease. 3. Chronic obstructive pulmonary disease with asthma. 4. Myofascial pain syndrome. 5. Anxiety and depression disorder.  REASON FOR HOSPITALIZATION:  The patient is a 60 year old white female with known history of recurrent acute pancreatitis... DD:  11/05/99 TD:  11/05/99 Job: [id number]

## 2010-05-22 NOTE — Procedures (Signed)
[place]. [facility]  Patient:    [name], [name]                      MRN: [id number] Proc. Date: 12/01/99 Adm. Date:  [id number] Attending:  Eino Farber CC:         Eino Farber., M.D.   Procedure Report  PROCEDURE:  Esophagogastroduodenoscopy with biopsy.  MEDICATIONS:  Hurricaine spray, fentanyl 100 mcg, Versed 10 mg IV.  INDICATIONS:  A 60 year old woman with persistent ulcers and persistent epigastric pain requiring multiple hospital admissions.  In order to better determine the etiology of her pain, endoscopy is performed.  DESCRIPTION OF PROCEDURE:  The procedure had been explained to the patient and consent obtained.  With the patient in the left lateral decubitus position, the Olympus video endoscope was inserted blindly into the esophagus and advanced under direct visualization.  The stomach was entered, the pylorus was identified, and in the pyloric channel was a deep ulcer on the anterior wall with partial obstruction.  I was unable to pass the scope because I was afraid to go through the ulcer if I pushed harder.  I could see the lumen open. There was no active bleeding.  The scope was withdrawn back into the antrum, and a biopsy was taken for CLOtest for Helicobacter.  There was a moderate amount of food and liquid in the stomach consistent with a partial outlet obstruction.  The fundus and cardia were seen well on retroflex view and were normal.  The scope was withdrawn.  The distal and proximal esophagus were normal.  The patient tolerated the procedure well, was maintained on low-flow oxygen and pulse oximeter throughout the procedure with no obvious problem.  ASSESSMENT:  Duodenal ulcer, pyloric channel ulcer with partial outlet obstruction.  PLAN:  Will start on double-dose proton pump inhibitors.  Would recommend treating aggressively for four to six weeks with an upper GI to document healing and assess  outlet emptying in four to six weeks. DD:  12/01/99 TD:  12/01/99 Job: [id number]

## 2010-05-22 NOTE — Discharge Summary (Signed)
[name]. [facility]  Patient:    [name], [name] Visit Number: [id number] MRN: [id number]          Service Type: EMS Location: [facility] Attending Physician:  [name] Dictated by:   [name], M.D. Admit Date:  02/16/2001 Disc. Date: 01/28/01   CC:         [name]. [name], M.D. [facility]   Discharge Summary  DISCHARGE DIAGNOSES:  1. Chronic pancreatitis.  2. Intractable nausea and vomiting for over six months.  3. Intractable abdominal pain for more than 15 years secondary to peptic     ulcer disease, partial gastric outlet obstruction, status post     hemigastrectomy and vagotomy and Billroth I with anastomosis in January     2002, by Dr. Lurene [name].  4. History of gastroparesis.  5. History of gastroesophageal reflux disease.  6. History of recurrent pancreatitis secondary to alcohol abuse.  7. Chronic obstructive pulmonary disease with asthma on Flovent and Serevent.  8. History of myofascial pain syndrome.  9. Status post total abdominal hysterectomy and bilateral     salpingo-oophorectomy more than 10 years ago. 10. History of suicide attempts in July 2002. 11. History of anxiety and depression followed at [facility] with     multiple admissions. 12. History of drug dependence with narcotics. 13. History of noncompliance. 14. Tobacco abuse. 15. Alcohol abuse. 16. Nutritional problems.  DISCHARGE MEDICATIONS: 1. Protonix 40 mg q.d. 2. Carafate 1 g/10 ml suspension q.i.d. 3. Ativan 1 mg t.i.d. 4. Neurontin 200 mg b.i.d. 5. Phenergan 25 mg suppository q.6h. p.r.n. 6. Vicodin 5/500 two pills q.6h. 7. Seroquel as per [facility]. 8. Proventil, Flovent or Serevent as per previous prescriptions.  PROCEDURES: 1. Magnetic resonance endoscopy on January 25, 2001, with biopsy performed by    Dr. Juanda [name] which showed evidence of esophagitis.  Biopsy taken from    endoscopy showed active esophagitis with no definitive  viral cytopathic    effects. 2. Magnetic resonance cholangiopancreatography done on January 26, 2001, which    shows prominence of bile duct, but no evidence of obstruction of common    bile duct.  It also showed normal pancreatic duct with no intrahepatic duct    dilatation.  No evidence of gallstones with normal spleen, pancreas and    kidneys with some suprarenal effusion on the right side.  CONSULTATION:  Dr. Juanda [name], GI.  CONDITION ON DISCHARGE:  The patient was discharged in fair condition of health and was asked to follow up with Dr. Dionicia [name] in the [facility].  HISTORY OF PRESENT ILLNESS:  [name] is a 60 year old, white female well-known to our clinic as well as our house staff.  She has a history of multiple medical problems, specifically intractable epigastric pain for more than 15 years, status post Billroth I, alcohol abuse, pancreatitis and multiple medical problems.  She presented with a six-month history of intractable nausea, vomiting and loss of weight.  According to her she lost about 100 pounds in two months.  Her symptoms have worsened the two weeks prior to admission.  She had no fever, no blood in her vomitus, but she did have some diarrhea on and off.  She has had multiple admissions for similar problems and now has additional abdominal pain with vomiting.  She was seen in the outpatient clinic on January 15, with the same symptoms where she had a workup done with elevated lipase of 302.  She was not,  however, admitted because he was not found not to be dehydrated at that time.  She came in with increasing symptoms on January 18.  PHYSICAL EXAMINATION:  VITAL SIGNS:  During her exam, she was found to have a temperature of 97.5, blood pressure 150/71, pulse 119, respiratory rate 20 and saturations 95% on room air.  The patient was found to be orthostatic at that point with a blood pressure of 130/63 and a pulse of 105 while supine, 160/44 with  a pulse of 129 standing and 160/60 with a pulse of 119 sitting.  GENERAL:  She was frail, chronically ill-looking in mild distress.  HEENT:  She did have frontal wasting, dry skin and mucosa with no JVD and supple neck.  CHEST:  Clear to auscultation bilaterally, but did have marked visible wasting of her chest muscles.  HEART:  She was tachycardic with some gallop rhythm.  ABDOMEN:  Her abdomen was very scaphoid with a midline scar, mild diffuse tenderness which is more in the epigastrium.  RECTAL:  Normal tone and she was guaiac negative.  LABORATORY DATA AND X-RAY FINDINGS:  White count 9.1, hemoglobin 13.8, platelets 235 with an MCV of 95.  Sodium 139, potassium 3.2, chloride 108, CO2 24, BUN 9, creatinine 0.7, glucose 147.  Her Alk phos was 88, AST 25, ALT 21, total protein 7.1, albumin 3.5 and total bilirubin 0.8.  She had an amylase of 206 and a lipase of 115 down from 306 three days earlier.  Her UA and urine drug screen were both normal on admission.  HOSPITAL COURSE:  #1 - NAUSEA, VOMITING AND DIARRHEA:  The patient came in with intractable nausea and vomiting as mentioned.  Her symptoms were thought to be secondary to multiple factors with possible gastritis and new-onset of pancreatitis.  The patient has had multiple problems with gastritis, pancreatitis and peptic ulcer disease and has had a couple of surgeries as well.  The patient was therefore admitted and with her orthostatic nature, she was given rehydration.  She was positive more than 5 L throughout her course of hospitalization.  Also, she was given Phenergan for control of her nausea and vomiting p.r.n. throughout hospitalization.  As part of the workup mentioned, her lipase, amylase, CT of abdomen and pelvis was all performed. Lipase was found to be trending downward compared to when she was in the  outpatient clinic.  GI was also consulted and Dr. Juanda [name] saw the patient.  She scoped the patient and  did find evidence of some gastritis and took some biopsies thinking there could be some viral gastroenteritis on top of what is going on.  The biopsies so far showed no evidence of viral gastritis.  As part of followup also, the patients nausea, vomiting and diarrhea were thought to be also linked to her substance abuse.  The patient did improve after receiving narcotics as per her home dose.  #2 - INTRACTABLE ABDOMINAL PAIN:  This was linked to the problem above.  This problem has been chronic for the past 15 years and was thought to be multifactorial.  She has had several admissions with surgeries as mentioned. GI was also involved in evaluation of this abdominal pain.  Dr. Juanda [name] scoped the patient as mentioned and she did not see any evidence of gastritis as mentioned, just esophagitis.  She also ordered an MRCP because of the negative CT.  The CT of the abdomen was negative for any acute event.  Dr. Juanda [name] ordered an MRCP of the  abdomen which as show above, did not indicate the course of the patients abdominal pain.  As such, conclusion was that her abdominal pain may be multifactorial as mentioned above.  It could be related to her substance abuse or probably withdrawal noting the patient has previously been in withdrawal and has had suicidal tendency and abuse with multiple admissions for this.  #3 - CHRONIC OBSTRUCTIVE PULMONARY DISEASE VERSUS ASTHMA:  The patient was kept on her home medications mainly Serevent and Flovent as well as nebulizers p.r.n. while in the hospital.  She did much better and had no problem with her pulmonary system throughout hospitalization.  #4 - HYPOKALEMIA:  This was an intractable problem with the patient throughout admission.  Her potassium levels kept falling despite continuous replacement. It was evident that the patient was total body depleted as she has several rounds of potassium IV as well as p.o.  The lowest potassium she had  during admission was 2.9.  On the day of discharge, her potassium has normalized.  #5 - SUBSTANCE ABUSE:  This is a serious issue with the patient.  The patient had contracted with Dr. Dionicia [name] in [facility].  Throughout this hospitalization, the patient asked to have her medication dosage interval changed.  Her wishes were not granted.  We kept the patient on her regular dose as per her contract with Dr. Dionicia [name].  She did well throughout hospitalization and on the day of discharge, the patient did not request extra narcotics.  #6 - TOBACCO ABUSE:  The patient was counseled seriously about her tobacco abuse.  Social workers were also involved in the patients counseling both in her substance abuse as well as the possibility of domestic violence.  #7 - NUTRITION:  The patient had a nutrition consult in the hospital noting the patient is emaciated and has lost weight.  There is no evidence of malignancy or organic cause of loss of weight from the workup so far done. Nutrition worked with patient to try and encourage high protein intake while she was in the hospital.  The patient was counseled to continue with that particular type of diet as an outpatient.  #8 - PSYCHOGENIC:  The patient has serious psychogenic issues.  Besides her drug dependence, the patient is thought to have a lot of multiple problems that she has not tried to address.  She, however, did well prior to discharge. Her moods seem to have settled down.  The patient was in better spirits by the time she left the hospital.  DISCHARGE LABORATORY DATA AND X-RAY FINDINGS:  White count 6.1, hemoglobin 8.8, platelets 197.  Sodium 139, potassium 3.7, chloride 109, CO2 26, glucose 94, BUN 8, creatinine 0.5, calcium 7.9.  FOLLOWUP:  The patient was therefore discharged to follow up in the outpatient clinic.  She was discharged in good health.   Dictated by:   [name], M.D. Attending Physician:  [name] DD:  02/17/01 TD:  02/17/01 Job: [id number]

## 2010-05-22 NOTE — Discharge Summary (Signed)
[facility]  Patient:    [name], EGGLETON Visit Number: [id number] MRN: [id number]          Service Type: PSY Location: 300 [id number] 02 Attending Physician:  Jeanice Lim Dictated by:   Reymundo Poll Dub Mikes, M.D. Admit Date:  11/01/2000 Discharge Date: 11/06/2000                             Discharge Summary  CHIEF COMPLAINT AND HISTORY OF PRESENT ILLNESS:  This was one of multiple admissions to [facility] for this 60 year old female admitted requesting detoxification.  She claimed that she was killing herself and she had to stop.  Stayed sober for one week after the last hospitalization at [facility] in July.  She went to [facility] in August, was detoxified for four days and then resumed drinking. Has been drinking four 24 ounce beers every day for four to six weeks prior to this admission.  Lost 30-40 pounds, unable to get food because of her drinking.  No sharp tremors when she tries to stop.  Past history of seizures and DTs.  PAST PSYCHIATRIC HISTORY:  [facility] since 1996, very noncompliant.  Fourth admission in the past year.  Previous admissions to [facility].  Extensive history of benzodiazepine, opiate, and alcohol dependence.  SUBSTANCE ABUSE HISTORY:  Two packs per day of cigarettes.  Ativan per prescription, Vicodin; on a contract with her primary physician.  PAST MEDICAL HISTORY:  Followed by Dr. Allena Katz, gastroenterologist.  Also Dr. Dionicia Abler.  History of asthma, COPD, nausea and vomiting, pancreatitis, myofascial pain, and chronic leg pain, Billroth I anastomosis.  MEDICATIONS: 1. Ativan 1 mg three times a day as needed. 2. Flovent two puffs twice a day. 3. Serevent twice a day. 4. Nexium 20 mg daily. 5. Vicodin one every four hours. 6. Reglan 10 mg two four times a day.  PHYSICAL EXAMINATION:  GENERAL:  Performed in the emergency room  failed to show any acute findings.  VITAL SIGNS UPON ADMISSION:  Blood pressure 174/104.  Weight 88 pounds.  LABORATORY DATA:  Potassium 2.9, treated with 40 mEq potassium.  Red blood cells 3.49, hemoglobin 11.4, hematocrit 33.4.  Alcohol level 143.  MENTAL STATUS EXAMINATION ON ADMISSION:  Disheveled, ill-appearing, thin female.  Constricted affect.  Having some mild nausea and tremulousness. Speech was normal in pace and tone.  Mood was irritable.  Thought processes: Logical and coherent.  No evidence of suicidal ideas or homicidal ideas, no evidence of psychosis.  ADMITTING DIAGNOSES: Axis I:    1. Alcohol dependence.            2. Opiate and benzodiazepines abuse.            3. Depressive disorder, not otherwise specified. Axis II:   Personality disorder, not otherwise specified. Axis III:  1. Chronic leg and back pain.            2. Pancreatitis.            3. Myofascial pain syndrome.            4. Hypokalemia. Axis IV:   Moderate. Axis V:    Global assessment of functioning upon admission 30, highest global            assessment of functioning in the last year 59.  HOSPITAL COURSE:  She was admitted and started in intensive  individual and group psychotherapy.  She was detoxified using phenobarbital.  She was kept on Prilosec, Serevent, Flovent, Proventil, and Reglan.  She was given potassium 40 mEq twice a day, Catapres 0.1 mg for acute arterial hypertension.  She was given Risperdal as needed for sleep.  The detoxification went better this time around as the ______ did not get established.  On October 4, she was continued to be detoxified.  On October 6, much better, better affect noticed to herself and others, more hopeful that this time around she was going to be able to make it.  As she was stable enough to be handled on a lower level of care, we went ahead and discharged her to outpatient followup.  DISCHARGE DIAGNOSES: Axis I:    1. Alcohol dependence.             2. Opiate and benzodiazepines abuse.            3. Depressive disorder, not otherwise specified. Axis II:   No diagnosis. Axis III:  1. Pancreatitis.            2. Chronic leg and back pain.            3. Myofascial pain syndrome.            4. Hypokalemia. Axis IV:   Moderate. Axis V:    Global assessment of functioning upon discharge 60.  DISCHARGE MEDICATIONS: 1. Prilosec 20 mg every day. 2. Atrovent two puffs twice a day. 3. Albuterol two puffs twice a day. 4. Reglan 10 mg four times a day. 5. Potassium 10 mEq daily. 6. Remeron at bedtime.  FOLLOWUP:  AA/NA as well as [facility]. Dictated by:   Reymundo Poll Dub Mikes, M.D. Attending Physician:  Jeanice Lim DD:  11/23/00 TD:  11/26/00 Job: [id number]

## 2010-05-22 NOTE — H&P (Signed)
[facility]  Patient:    [name], [name]                      MRN: [id number] Adm. Date:  [id number] Disc. Date: [id number] Attending:  Devoria Albe Dictator:   Young Berry. Scott, N.P.                   Psychiatric Admission Assessment  NO DICTATION. DD:  04/27/00 TD:  04/27/00 Job: [id number]

## 2010-05-22 NOTE — H&P (Signed)
NAMEKEIRSTAN, [name]               ACCOUNT NO.:  [id number]   MEDICAL RECORD NO.:  [id number]          PATIENT TYPE:  INP   LOCATION:  0101                         FACILITY:  [facility]   PHYSICIAN:  [name], M.D.    DATE OF BIRTH:  07/13/1950   DATE OF ADMISSION:  03/08/2006  DATE OF DISCHARGE:                              HISTORY & PHYSICAL   PRIMARY CARE PHYSICIAN:  Dr. Billee Cashing.   CHIEF COMPLAINT:  Abdominal pain, nausea and vomiting.   HISTORY OF PRESENT ILLNESS:  The patient is a 60 year old woman with a  past medical history significant for alcohol abuse, chronic  pancreatitis, peptic ulcer disease, and COPD, who presents to the  emergency department with a chief complaint of abdominal pain, nausea  and vomiting.  The abdominal pain started 2 days ago.  It is located in  the epigastrium.  It is moderate to severe in intensity.  The pain was  initially intermittent; however, over the past 24 hours, the pain has  been constant.  The abdominal pain has been associated with nausea and  vomiting.  Today, she vomited 6 times . She said that there was a  little blood in her emesis and a little coffee grounds consistency.  She denies diarrhea, constipation, bright red blood per rectum, or black  tarry stools.  Her last bowel movement was normal yesterday.  Her last  attempted meal was yesterday.  The patient admits to drinking 1 beer  last night; however, she states that she hardly drinks every day  anymore.  She generally drinks 1-2 beers 3-4 days per week now.  She  says that her husband will not allow her to drink any more than that.  Over the past few days her review of systems is negative for diarrhea,  vaginal bleeding or swelling in her legs.  Her review of systems is  positive for subjective fever, some pain with urination, and a poor  appetite.   During the evaluation in the emergency department, the patient is noted  to be afebrile.  She was initially  tachycardic with a pulse rate of 120  b.p.m.  The acute abdominal series reveals COPD, no obstruction or free  air, and fluid in the stomach.  Her lipase is within normal limits at  24.  The patient was given multiple mg of Dilaudid intravenously with no  lasting relief in her pain.  The patient will therefore be admitted for  further evaluation and management.   PAST MEDICAL HISTORY:  1. Alcohol abuse with a history of alcoholic seizures, the last      seizure was in September of 2007.  2. Chronic pancreatitis.  3. Peptic ulcer disease status post partial gastrectomy.  4. Chronic anemia.  5. COPD with ongoing tobacco abuse.  6. Gastroparesis (delayed gastric emptying per gastric emptying study      in December of 2006; she emptied 25-50% during the evaluation).  7. Chronic anxiety.  8. Status post hysterectomy.  9. Tobacco abuse.   MEDICATIONS:  1. Ativan 1 mg t.i.d.  2. Prilosec 20 mg daily.  3.  Dilaudid 4 mg t.i.d.   ALLERGIES:  THE PATIENT HAS AN ALLERGY TO THORAZINE.   SOCIAL HISTORY:  1. The patient is married.  2. [place].  3. She is disabled.  4. She smokes 1 to 1-1/2 packs of cigarettes per day.  5. She has reduced the amount of alcohol intake to 1-2 beers 3-4 days      per week.  She denies any illicit drug use.   FAMILY HISTORY:  1. Her mother recently died of a heart attack and pneumonia at 58      years of age.  2. Her father died from complications secondary to cirrhosis, alcohol      abuse and pancreatitis  He was 58 years old.   REVIEW OF SYSTEMS:  As above in the History of Present Illness.   EXAM:  Temperature 98.1, blood pressure 142/82, pulse 120, repeated at  84, oxygen saturation 94% on room air, respiratory rate 20.  GENERAL:  The patient is a thin-framed 60 year old Caucasian woman who  looks older than her stated age.  She is in no acute distress.  HEENT:  Head is normocephalic, nontraumatic.   Pupils are equal, round,  reactive to light, extraocular movements are intact.  Conjunctivae are  not clear, sclera white, nasal mucosa is dry.  OROPHARYNX:  Partial dentures.  Mucous membranes are dry.  No posterior  exudates or erythema.  NECK:  Supple.  No adenopathy, no thyromegaly, no bruit, no JVD.  LUNGS:  Diffuse mild bronchospasms throughout all lung fields.  Breath  sounds audible down to the bases.  Breathing is non-labored.  HEART:  S1, S2 with a soft systolic murmur.  ABDOMEN:  Hypoactive bowel sounds, soft, mildly distended, moderately  tender in the epigastrium with no rebound guarding or masses palpated.  Well-healed epigastric scar.  No appreciable hepatosplenomegaly.  GU/RECTAL:  Deferred.  Pedal pulses are 2+ bilaterally.  No pretibial  edema and no pedal edema.  NEUROLOGIC:  The patient is alert and oriented x3, cranial nerves II-XII  are intact.  PSYCHOLOGICAL:  The patient is alert and oriented x3.  She appears to be  mildly tremulous and anxious.  She is cooperative.  Her affect is flat  and occasionally anxious.   ADMISSION LABORATORIES:  An EKG reveals normal sinus rhythm with a heart  rate of 84 b.p.m. and right atrial enlargement.  Lipase is 24, ammonia  level is 44, PT 12.6, sodium 139, potassium 4.7, chloride 109, CO2 23,  glucose 100, BUN 8, creatinine 0.74, total bilirubin 0.9, alkaline  phosphatase 102, SGOT 23, SGPT 11, total protein 6.1, albumin 3.4,  calcium 8.6.  A WBC 7.6, hemoglobin 11.9, platelets 208,000.   ASSESSMENT:  1. Epigastric abdominal pain, nausea and vomiting.  Given the      patient's past medical history, will consider acute on chronic      pancreatitis, gastritis, and exacerbation of peptic ulcer disease      as the potential etiologies.  The patient also has fluid in the      stomach per acute abdominal series.  She has known gastroparesis. 2. Alcohol abuse.  Although the patient has reduced her intake      significantly, she has  not completely stopped.  3. Chronic obstructive pulmonary disease with ongoing tobacco abuse.      The patient has audible bronchospasms on exam.  4. Chronic anxiety.  5. Chronic anemia.   PLAN:  1. The patient  will be admitted for further evaluation and management.  2. She will be made n.p.o.  3. IV fluid volume repletion with D5 normal saline with vitamins      added.  Will give her an IV dose of thiamine now.  4. Reglan 5 mg IV q.6h. scheduled.  Will also add p.r.n. Zofran and      Phenergan.  5. Pain management with intravenous Dilaudid.  6. Consider NG tube if her symptoms do not resolve in the next 24-48      hours.  7. Will start the Ativan alcohol withdrawal protocol and continue      Ativan 1 mg q.8h. thereafter.  8. Will start a nicotine patch 21 mg daily.  Will counsel the patient      on tobacco cessation.  Will start treatment of the bronchospasms      with Xopenex and Atrovent nebulizers.  9. When the patient is able to take p.o. medications, consider adding      Pancrease chronically for chronic pancreatitis.      [name], M.D.  Electronically Signed     DF/MEDQ  D:  03/08/2006  T:  03/08/2006  Job:  [id number]   cc:   Lorelle Formosa, M.D.  Fax: [phone]

## 2010-05-22 NOTE — Discharge Summary (Signed)
[place]  Patient:    [name], [name]                      MRN: [id number] Adm. Date:  [id number] Disc. Date: [id number] Attending:  Eino Farber Dictator:   Dionne D. Su Hilt, N.P.                           Discharge Summary  ADMITTING AND DISCHARGE DIAGNOSES: 1. Acute and recurrent pancreatitis. 2. Pyloric/peptic ulcer disease. 3. Chronic obstructive pulmonary disease with asthma. 4. Mild status post pain syndrome, especially chest. 5. Tobacco dependent.  HISTORY OF PRESENT ILLNESS:  Patient is a 60 year old white female with multiple medical problems with complaints of severe upper abdominal pain and uncontrolled vomiting with a history of recurrent pancreatitis and a history of active pyloric peptic ulcer disease admitted from the ED.  ALLERGIES:  No known drug allergies.  LABORATORIES AND STUDIES:  On Jun 04, 1999, x-ray of the abdomen and chest with no evidence of active cardiopulmonary or abdominal process.  CBC with DIFF on Jun 04, 1999 showed WBC of 14.2, hemoglobin 13.6, hematocrit 39.4, platelets of 353.  Chemistry studies on Jun 04, 1999 showed sodium of 137, potassium 3.1, chloride 100, CO2 of 27, glucose 121, BUN of 11, creatinine 0.6, calcium 10.2. Total protein 7.8, albumin 3.9, AST of 20, ALT of 12, ALP of 96, Total bilirubin of 0.7.  Amylase of 141, lipase of 19.  HOSPITAL COURSE:  IV therapy was initiated.  Patient was started on Phenergan and also was given morphine IV for severe pain and she was started on Pepcid 20 mg IV q.12h.  Initially, she was given a clear diet.  For COPD, patient was started on albuterol 2.5 nebulized treatment q.4h.  Nexium 40 mg caps q.a.m. was added.  She continued to have severe abdominal pain and her morphine was increased with Phenergan to q.3h.  She was advanced to a full liquid diet with no caffeinated beverages.  Her condition improved where her Pepcid was changed to a p.o.  route; however, the patient continued to complain of severe abdominal pain requiring IM injection because her vein access was poor so she was taken off her Nexium and her Pepcid and started on Prilosec, Protonix, and started on Reglan 10 mg before meals and at bedtime.  Also, Vicodin was added to her plan of care of one q.4h.  She had an episode of hypokalemia so she was started on K-Dur orally.  DISCHARGE CONDITION:  Prior to discharge, patient was improved on her p.o. meds, nausea and vomiting was controlled with Phenergan p.o, but she did take a dose of Phenergan intramuscularly prior to her admission.  DISCHARGE MEDICATIONS: 1. Vicodin one q.6h. p.r.n. for pain. 2. Tussionex suspension one teaspoon every 12 hours p.r.n. for cough. 3. Ativan one b.i.d. 4. Phenergan suppository 25 mg one q.8h. p.r.n. for nausea not relieved by    tablets. 5. Nexium 40 mg tablet one q.d. 6. Phenergan 25 mg tab one q.8h. p.r.n. for nausea. 7. Albuterol meter-dosed inhaler two puffs q.i.d. for chest tightness. 8. Serevent meter-dosed inhaler two puffs q.a.m. and q.h.s. 9. Flovent 110 meter-dosed inhaler two puffs q.a.m. and q.h.s.  DISCHARGE INSTRUCTIONS: 1. Ambulate as tolerate. 2. Diet: No caffeinated beverages, low-fat diet.  FOLLOW UP APPOINTMENT:  July 18 ______ . DD:  09/17/99 TD:  09/19/99 Job: [id number]

## 2010-05-22 NOTE — Discharge Summary (Signed)
[place]  Patient:    [name], [name]                      MRN: [id number] Adm. Date:  [id number] Disc. Date: [id number] Attending:  Alfonso Ramus Dictator:   Bernerd Pho, M.D. CC:         Rennis Petty, M.D.   Discharge Summary  DISCHARGE DIAGNOSES:  1. Chronic abdominal pain, with roughly 10 admissions over the last year with     negative workups.  2. Chronic nausea and vomiting.  3. Gastroesophageal reflux disease.  4. Gastroparesis.  5. Asthma.  6. Myofascial pain syndrome.  7. Anxiety.  8. Depression.  9. Tobacco abuse.  DISCHARGE MEDICATIONS:  1. Potassium chloride 40 mEq p.o. b.i.d. x 3 days.  2. Phenergan 25 mg p.o. q.6h. p.r.n. nausea.  3. Phenergan suppositories 1 suppository q.6h. p.r.n. nausea.  4. Vicodin 1 tablet p.o. q.4h. p.r.n. pain.  5. Ativan 1 mg p.o. t.i.d. p.r.n. anxiety.  6. Nexium 20 mg p.o. q.d.  7. Flovent 2 puffs b.i.d.  8. Serevent 3 puffs b.i.d.  9. Elavil 25 mg p.o. q.h.s. 10. Tussionex p.r.n.  FOLLOW-UP:  The patient is to return to [facility] for hospital follow-up with Dr. Allena Katz on May 26, 2000, at 2 p.m.  After this visit the patient will be scheduled to see Dr. Dionicia Abler, her primary care Roniya Tetro.  CONSULTATIONS:  None.  PROCEDURES:  None.  HISTORY OF PRESENT ILLNESS:  The patient is a 60 year old white female with past medical history of roughly 20 years of intractable epigastric pain refractory to medical and surgical management, medical noncompliance, and COPD, who has been hospitalized roughly 10 times over the last year for chronic abdominal pain.  The patients prior hospitalizations have been negative in regards to an organic cause for the patients intractable nausea, vomiting, and abdominal pain.  The patient presents to the emergency room today with her typical nausea, vomiting, and abdominal pain.  She states she had an episode of extreme weakness on the morning of  admission and states she has not eaten a meal in 30 days and reports losing roughly 30 pounds in the last few weeks.  The patient feels that her weakness is caused by decreased p.o. intake and reports a syncopal episode earlier in the morning.  The patients review of systems is grossly positive, as she reports palpitations, minimal bladder incontinence, cough productive of yellow phlegm for the last three weeks, subjective fevers, nausea, vomiting, shortness of breath with exertion and occasionally with sitting, chest pain which she describes as pressure-like sensation which has gone on for the last month with no associated symptoms.  She also acknowledges dark stools and dysuria.  PAST MEDICAL HISTORY: 1. Intractable epigastric pain, nausea, and vomiting for the last 22 years,    according to the patient.  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 Lurene Shadow.  The    patients pain has been refractory to medical and surgical management.  She    has been discharged from numerous clinics in [place] secondary to    nonresolution of symptoms.  The patients epigastric pain has been    attributed in the past to gastroparesis and GERD.  The patient has a    history of being admitted to the hospital for IV morphine and Phenergan and    feeling well by hospital day 2  and 3 and requesting discharge.  During the    patients prior hospitalizations she has had negative workups. 2. 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 3. Narcotic contract with primary care Pearla Mckinny. 4. Medical noncompliance. 5. Anxiety and depression. 6. COPD with asthma. 7. Myofascial pain syndrome of the back and chest wall. 8. Status post TAH/BSO roughly 10 years ago.  ADMISSION MEDICATIONS:  Percocet.  Vicodin.  Tussionex p.r.n.  Ativan 1 t.i.d. Nexium 20 q.d.  Flovent and Serevent metered dose inhalers.  Elavil 25 mg q.h.s.  ALLERGIES:  THORAZINE,  which causes a DYSTONIC REACTION.  SOCIAL HISTORY:  The patient lives in [place] with her husband and has two grandchildren.  The patient smokes roughly one pack a day and denies any alcohol consumption for the last 10 years.  The patient denies any IV drug use.  FAMILY HISTORY:  Remarkable for dad with lung cancer at age 32.  PHYSICAL EXAMINATION:  VITAL SIGNS:  Temperature 98.4, respiratory rate 20, blood pressure 141/62, heart rate 88.  The patient was not orthostatic by blood pressure or heart rate lying, sitting, and standing.  GENERAL:  White female in no apparent distress.  HEENT:  Pupils are equal, round, and reactive to light.  Extraocular movements intact.  Oropharynx clear, with some mild erythema.  No exudate, no cervical lymphadenopathy.  CARDIOVASCULAR:  Regular rate and rhythm.  No murmurs, rubs, or gallops.  LUNGS:  Clear to auscultation bilaterally.  Without any wheezes, crackles, or rhonchi.  ABDOMEN:  Bowel sounds present.  Soft, nondistended, tender in the epigastric and right upper quadrant region to deep palpation, which is not reproducible with distraction.  EXTREMITIES:  No edema in the lower extremities bilaterally.  With 2+ PT pulses bilaterally.  NEUROLOGIC:  Alert and oriented and nonfocal.  ADMISSION LABORATORY DATA:  Amylase 68, lipase 30.  Hemoglobin 12.1, white blood cell count 6.1, platelet count 251, with an ANC of 4.4.  UA negative for blood, nitrites, and leukocytes.  Alcohol level less than 10.  LDH 150.  AST 17, ALT 12, alkaline phosphatase 85, total bilirubin 0.4, total protein 6.3, albumin 3.2.  Total cholesterol 171.  HOSPITAL COURSE: #1 - CHRONIC ABDOMINAL PAIN REFRACTORY TO MEDICAL AND SURGICAL MANAGEMENT: The patient presented to the emergency department with her typical complaints of abdominal pain and nausea and was subsequently admitted for IV morphine and hydration.  However, on review of the patients workup, her  laboratories were  normal in regards to BUN and creatinine.  The patients lipase was normal, and the patients BUN and creatinine suggested that she was not dehydrated. Furthermore, the patient was not orthostatic on blood pressure and pulse.  The patients albumin of 3.2 suggested that she has had decent p.o. intake over the days preceding admission.  By the day after admission the patient was leaving her room to smoke and was walking in the halls, which is typical based on her prior hospitalizations.  The attending physician, Dr. Reche Dixon, myself, and the patient met for roughly one hour on May 19, 2000, to discuss the patients prior workup of her abdominal pain and to come up with a plan in the future.  The patient was told that it is unreasonable to expect complete cure of the patients chronic abdominal pain, nausea, and vomiting, as it has been bothering her for the last 20-some years and appears refractory to medical and surgical intervention.  The plan devised in this meeting is that the patient will be routed from the emergency  room to the outpatient clinic in the future if she presents with her typical abdominal pain, nausea, and vomiting.  If the patient presents to the emergency room after hours, the patient will be assessed with adequate history and physical examination.  The patient should also have a lipase and BMET checked.  If the patients physical examination is unremarkable, BUN and creatinine not suggesting dehydration, normal lipase, and no orthostatic changes on blood pressure or heart rate, she should not be admitted in the future.  The patient should not receive IV morphine in the emergency room and should be maintained with her usual medications that she is on in regards to Vicodin and Phenergan.  DISPOSITION:  The patient is being discharged to home. DD:  05/19/00 TD:  05/21/00 Job: [id number]

## 2010-05-22 NOTE — Discharge Summary (Signed)
[facility]  Patient:    [name], [name] Visit Number: [id number] MRN: [id number]          Service Type: PSY Location: 300 0300 02 Attending Physician:  Jeanice Lim Dictated by:   Haskel Khan, M.D. Admit Date:  11/01/2000 Discharge Date: 11/06/2000                             Discharge Summary  IDENTIFYING DATA:  This is a 60 year old, married, Caucasian female voluntarily admitted with history of multiple admissions to [facility] well known with a past psychiatric history of being followed at the [facility], chronically noncompliant with medications, last admission was October 04, 2000, surprisingly her fifth admission within the past 12 months. She also has prior admissions at [facility].  MEDICATIONS:  Ativan, Vicodin, Reglan, Serevent, and Flovent.  DRUG ALLERGIES:  THORAZINE which caused dystonic reaction.  PHYSICAL EXAMINATION:  Physical exam essentially within normal limits, vital signs stable, afebrile, done at [facility], neurologically grossly nonfocal and intact.  LABORATORY EXAMINATION:  Admitting labs: Amylase and lipase were within normal limits; CBC within normal limits; CMET within normal limits.  Amylase was 97, lipase 49.  Urine drug screen negative.  MENTAL STATUS EXAMINATION:  This was a somewhat ill-appearing, cachectic female, appearing older than her stated age, alert and oriented, without psychomotor abnormalities.  Mood was dysphoric.  Affect blunt, irritable. Speech within normal limits.  Thought process was mostly goal directed. Thought content was negative for psychotic symptoms and dangerous ideation. She admitted to a recent history of abusing alcohol, being opiate dependent and benzodiazepine dependent.  ADMITTING DIAGNOSES: Axis I:     1. Alcohol dependence.             2. Opiate dependence.             3. Benzodiazepine dependence.             4. Possible substance-induced  mood disorder. Axis II:    None. Axis III:   1. History of seizure disorder.             2. Nausea.             3. Anorexia.             4. Chronic leg pain.             5. Asthma. Axis IV:    Severe problems related to social environment, specifically lack             of support and dependence on alcohol and drugs. Axis V:     32; highest past year 55.  HOSPITAL COURSE:  The patient was started on routine p.r.n. medications and a phenobarbital taper to safely detox her with close monitoring.  She was given Phenergan p.r.n. nausea and past psychiatric history was requested from [facility].  She was encouraged to drink Gatorade, given Seroquel p.r.n. anxiety/agitation and Neurontin t.i.d. for anxiety and irritability which was titrated.  The patient tolerated this well without side effects.  CONDITION ON DISCHARGE:  Markedly improved.  Mood was mostly euthymic.  Affect brighter.  Thought process was goal directed.  Thought content negative for suicidal or homicidal or violent ideation and there was no psychotic symptoms. Judgment and insight had improved and she reported motivation to be compliant with followup recommendations.  DISCHARGE RECOMMENDATIONS:  Follow up at [facility].  She was advised not to drive if sedated and return to the ER if distressed or side effects should occur.  Given discharge prescriptions for Neurontin 400 mg b.i.d. and 600 q.h.s., Bentyl 20 mg q.6 p.r.n. and Phenergan 50 mg q.8 p.r.n. nausea, Seroquel 50 mg q.8 p.r.n. anxiety/agitation, Vicodin one q.i.d. with a three-day supply and followup with her PCP was scheduled for November 08, 2000, Reglan 10 mg t.i.d., Protonix 40 mg q.a.m. and Serevent inhaler two puffs b.i.d.  DISCHARGE DIAGNOSES: Axis I:     1. Alcohol dependence.             2. Opiates dependence.             3. Benzodiazepines dependence.             4. Possible substance-induced mood  disorder.             5. Anxiety disorder, not otherwise specified. Axis II:    None. Axis III:   1. History of seizure disorder.             2. Nausea.             3. Anorexia.             4. Chronic leg pain.             5. Asthma. Axis IV:    Severe problems related to social environment, specifically lack             of support and dependence on alcohol and drugs. Axis V:     Global Assessment of Functioning on discharge was 55. Dictated by:   Haskel Khan, M.D. Attending Physician:  Jeanice Lim DD:  12/06/00 TD:  12/07/00 Job: [id number]

## 2010-05-22 NOTE — Discharge Summary (Signed)
[name], [name]                         ACCOUNT NO.:  [id number]   MEDICAL RECORD NO.:  [id number]                   PATIENT TYPE:  INP   LOCATION:  0477                                 FACILITY:  [facility]   PHYSICIAN:  Deirdre Peer. Polite, M.D.              DATE OF BIRTH:  06/08/1950   DATE OF ADMISSION:  08/26/2001  DATE OF DISCHARGE:  08/30/2001                                 DISCHARGE SUMMARY   DISCHARGE DIAGNOSES:  1. Seizure secondary to ETOH withdrawal.  2. Abdominal pain, improved.  3. Elevated LFTs secondary to alcohol use.  4. ETOH/polysubstance abuse.   DISCHARGE MEDICATIONS:  1. Vicodin 5/500 one to two tablets q.8h. as needed for pain.  2. Ativan 1 mg q.8h. p.r.n. for anxiety.   CONSULTS:  None.   PROCEDURE:  Chest and abdominal x-ray August 26, 2001 revealed chronic  obstructive pulmonary disease, no evidence for acute cardiopulmonary disease  or bowel obstruction.   LABORATORY DATA:  Admission metabolic panel essentially unremarkable except  for ALT of 120, WBC 10.7, RBC 3.80, hemoglobin 9.9, hematocrit 30.5, RDW  21.7.  Neutrophils 82.  Lipase 22, amylase 104.  Drug screen positive for  opiates.  Repeat chemistries on August 27, 2001 with a total protein of 5.8,  albumin 2.9, ALT 89.   DISPOSITION:  The patient will be discharged home.   HISTORY OF PRESENT ILLNESS:  This is a 60 year old female with a history of  multiple hospital admissions secondary to her ETOH withdrawal and  intoxications, depression, and pancreatitis.  The patient was brought to  [facility] Emergency Room on August 23 after witnessing a seizure that  lasted approximately five minutes.  The husband reported that the patient  was picking some things up in a chair and then had a seizure noted with  tongue biting.  According to the husband, she was seen in the emergency room  two to three times within the prior two weeks for abdominal pain and  questionable CHF.  The patient did report  having a two to three day history  of nausea and vomiting and diarrhea which prevented her from ingesting  alcohol for the past two days.  Initial examination noted her to be weak.  Vital signs were stable.  She was admitted for further evaluation and  treatment of seizure disorder felt to be most likely due to ETOH withdrawal,  her abdominal pain, and rule out of pancreatitis.   HOSPITAL COURSE:  1. SEIZURE SECONDARY TO ETOH WITHDRAWAL:  The patient experienced no further     seizures during her hospitalization.  No antiseizure medications were     initiated.  2. CHRONIC ABDOMINAL PAIN:  Improved with p.r.n. analgesics.  Her amylase     and lipase were within normal limits.  Her p.o. intake has been adequate.     She is having regular bowel movements.  Abdomen is soft.  She will  be     discharged home with p.r.n. analgesics.  3. ELEVATED LIVER FUNCTION TESTS SECONDARY TO ALCOHOL USE:  At the time of     discharge her LFTs were almost completely resolved except for ALT of 89.  4. ETOH/POLYSUBSTANCE ABUSE:  The patient was counseled by case manager on     August 29, 2001 where options given for hospital rehabilitation.  Per the     case managers documentation, the patient did not display any desire for     rehabilitation.  Again, the patient has a very poor history noted with     frequent hospital admissions secondary to her alcohol intoxications.  She     also has a very poor history of outpatient follow-up, although she has     been given the option for [facility].   FOLLOW UP:  The patient has been given information about [facility] for  possible primary care.  Per her history this probably will not be carried  out.  The patient has been also counseled about ETOH cessation along with  tobacco cessation.     Stephanie Swaziland, NP                      Deirdre Peer. Polite, M.D.    [name]  D:  08/30/2001  T:  08/30/2001  Job:  [id number]

## 2010-05-22 NOTE — Discharge Summary (Signed)
. [facility]  Patient:    [name], [name]                      MRN: [id number] Adm. Date:  [id number] Disc. Date: [id number] Attending:  Levy Sjogren Dictator:   Ebbie Ridge, M.D. CC:         Rennis Petty, M.D., internal medicine outpatient clinic   Discharge Summary  PROCEDURES:  Placement of a femoral vein central catheter April 03, 2000.  CONSULTATIONS:  None.  DISCHARGE DIAGNOSES: 1. Chronic abdominal pain. 2. Chronic nausea and vomiting. 3. Gastroesophageal reflux disease. 4. Gastroparesis. 5. Asthma. 6. Myofascial pain syndrome. 7. Anxiety. 8. Depression. 9. Tobacco abuse.  DISCHARGE MEDICATIONS: 1. Percocet 5 mg/325 mg one to two tablets p.o. q.6h. p.r.n. pain, #20 given. 2. Ativan 1 mg p.o. two to three times a day p.r.n. anxiety, #10 given. 3. Nexium 20 mg p.o. q.d. 4. Flovent two puffs b.i.d. 5. Serevent three puffs b.i.d.  HISTORY OF PRESENT ILLNESS:  [name] is a 60 year old Caucasian woman with a past medical history significant for chronic recurrent nausea, vomiting, and abdominal pain who presented to the emergency room with a one-day history of nausea, vomiting and abdominal pain.  She reported 10 to 12 episodes of emesis on the day of admission that were bilious and nonbloody.  She described mid abdominal pain.  She stated that she had been unable to keep food, fluid, or pill down for one day.  She had been feeling well over the past two weeks since discharge from [facility] until the acute onset of nausea and vomiting on the day prior to admission.  She was admitted for IV rehydration and pain control.  LABORATORY DATA ON ADMISSION:  Serum amylase was 153 and lipase was 16. Complete metabolic panel was normal with the exception of a low potassium at 2.9, a low total protein of 5.9, and a low albumin of 3.0.  BUN and creatinine are 14 and 0.6.  A complete blood count revealed white count of 7.8 with  70% neutrophils and 22% lymphocytes.  Hemoglobin and hematocrit were 10.3 and 30.6, respectively.  Platelet count 274.  Urine pregnancy test was negative. Urinalysis revealed specific gravity of 1.030 with 15 ketones but was otherwise negative.  Urine drug screen was positive for barbiturates, benzodiazepines, methadone, opiates, phencyclidine, and tricyclics.  HOSPITAL COURSE:  #1 - NAUSEA, VOMITING:  Acute abdominal series was done to rule out obstruction and was normal.  IV access was obtained via femoral vein catheter and IV fluids were started for rehydration.  The patient received IV Phenergan for persistent nausea and vomiting, until the day of discharge when she began tolerating a regular diet and p.o. Phenergan. Although the patient reported multiple episodes of emesis on the first few days of her admission, only three episodes of small emesis were noted by her nurses.  #2 - ABDOMINAL PAIN:  Initially the patient was given 5 mg of IV morphine q.2h. p.r.n.  However, after she was noted to be walking around the halls and going outside frequently for smoking, and returning to her room on schedule every two hours to request her dose of morphine, this was reduced.  The following day, it was discontinued as it seemed to be causing more severe nausea.  The patient was switched to p.o. Percocet which she stated controlled her pain well.  She did request this medicine every four hours and asked to be discharged with  a prescription. She was given only 20 pills which should last her until her visit with her primary doctor on Friday of this week.  No cause of the patients chronic abdominal pain was established.  She has a history of peptic ulcer disease and is status post hemigastrectomy and vagotomy/Billroth I in January of 2002, by Luisa Hart L. Lurene Shadow, M.D.  She had been transferred to [facility] on January 24, 2000, for further management and evaluation of her abdominal pain.  EGD  there on January 26, 2000, revealed mild Candida esophagitis and mild gastritis and gastroparesis.  A CT of her abdomen and pelvis on February of 2002, revealed no abscess and no obstruction.  She does take Nexium for her dyspepsia.  She does have a history of alcohol abuse and pancreatitis in the past but has had no evidence of chronic pancreatitis on CT scans.  As there is no objective cause of her abdominal pain, the patient may benefit from a long-acting pain medicine, referral to a pain management clinic, or referral to psychiatrist.  #3 - ANXIETY:  The patient states that she takes Ativan 1 mg t.i.d. at home and has been using this medication for years. She claims that after trying to stop at one point, she did go through withdrawal.  She received this medicine while in the hospital and was given a prescription for 10 pills to last her until her visit with her primary doctor.  In the future, she may benefit from a long-acting anxiolytic such as Buspar.  #4 - DEPRESSION:  The patient has a difficult social situation and admitted to feelings of depression.  However, she did not have a desire at this time to pursue treatment.  This may be something that can be addressed further as an outpatient.  #5 - TOBACCO ABUSE:  The patient was counseled regarding tobacco cessation but expressed no desire to quit at this time.  #6 - ASTHMA:  The patient had no respiratory problems during hospitalization. She was continued on her home medications of Flovent and Serevent.  #7 - HYPOKALEMIA:  [name] potassium resolved to normal (3.7) after IV replacement.  FOLLOW-UP:  The patient has an appointment to follow up with Dr. Rennis Petty at the internal medicine outpatient clinic of [facility] on Friday, April 08, 2000, at 9:45 a.m. DD:  04/06/00 TD:  04/06/00 Job: [id number]

## 2010-05-22 NOTE — H&P (Signed)
[facility]  Patient:    [name], [name]                      MRN: [id number] Adm. Date:  [id number] Disc. Date: [id number] Attending:  Devoria Albe Dictator:   Young Berry. Lorin Picket, N.P.                   Psychiatric Admission Assessment  DATE OF ADMISSION:  April 21, 2000.  IDENTIFYING INFORMATION:  This is a 60 year old married white female, voluntary admission for detoxification.  Patients chief complaint:  "I just want to get off these pills.  Im not a drug addict.  These are prescribed for me."  REASON FOR ADMISSION AND SYMPTOMS:  Patient reports abuse of Ativan and Percocet.  She reports she wants to get off the Percocet which she takes up to approximately 10 per day, which she has been taking and were prescribed for her for abdominal pain, secondary to chronic pancreatitis, and also reports abusing Ativan 1 mg, which was prescribed for her for her "nerves."  She has a prescribed 1 mg p.o. t.i.d. and has been using approximately 5 mg per day. Last use of both drugs April 21, 2000.  Patient also reports abuse of Dilaudid 2 mg, using these 2 to 3 per day as suppositories which were also prescribed earlier for her pancreatitis.  She last used these on April 17 and reports that these were given to her in the emergency room.  Patient reports decreased appetite for approximately 1-1/2 months with a 27 pound weight loss over the past 1 month.  She reports decreased sleep with initial insomnia and sleeping 4 to 5 hours per night.  She does admit to feeling down and depressed but without any thoughts of suicide or homicide, no auditory or visual hallucinations, but marked anhedonia over the past 2 to 4 months.  PAST PSYCHIATRIC HISTORY:  Patient reports being diagnosed with depression many years ago and treated at [facility] but stopped her appointments there many years ago.  She currently has no outpatient psychiatrist.  She has an  inpatient history of multiple hospitalizations at [facility] x 3 from 1979 through the 1980s and was at [facility] in 1997.  She has a history of 3 previous suicide attempts during the 80s, none recent.  SOCIAL HISTORY:  Patient was educated through the 8th grade.  She is currently on disability for "her nerves" and chronic gastrointestinal problems.  She has been married 3 times, current marriage for the past 8 years, with 2 children ages 36 and 62.  She lives with her husband currently, [address] [place] in [place].  States that her husband abuses alcohol.  He works at [employer]. Patient denies any legal or financial problems and states that her husband is supportive of her detox efforts.  FAMILY HISTORY:  Positive for father and 3 brothers and 2 children all with drug abuse problems.  ALCOHOL AND DRUG HISTORY:  Patient reports abuse of alcohol 10 years prior, no abuse currently.  She reports abuse of prescription drugs as noted above, denies any use of street drugs, denies any use of cocaine.  Reports using tobacco, smoking 1 pack per day of cigarettes for the past 30 years.  PAST MEDICAL HISTORY:  Patient is followed by Dr. Hessie Diener at the [facility] clinics, last seen by her at the hospital in the emergency room on April 18.  Medical problems include chronic pancreatitis, chronic bronchitis and COPD, status post gastric ulcer resection in January of 2002.  Medications include Ativan 1 mg p.o. t.i.d., Percocet tabs 1-2 q.4h. p.r.n. for pain, Dilaudid 2 mg 1 per day suppositories, Nexium 20 mg 1 q.d., Phenergan 25 mg t.i.d. either p.o. or suppositories, Serevent 2 puffs b.i.d. and Flovent 2 puffs b.i.d.   Patient also reports that she was prescribed Elavil many years ago.  She is unclear of the dose since it was changed several times and she has not taken this regularly.  She states this was prescribed originally for her  depression.  DRUG ALLERGIES:  THORAZINE.  POSITIVE PHYSICAL FINDINGS:  Patient was seen in the emergency room at [facility] on April 18 for complaints of chest pain.  Her chest film at that time was negative and no myocardial injury was found on that visit.  Her potassium was 3.2, RBCs low at 3.83, hemoglobin 11.8, hematocrit 34.9.  Amylase and lipase were within normal limits.  CK and CKMB were within normal limits.  Patients weight on admission here was 109 pounds.  She reports her baseline at 135 pounds.  She is 5 feet 3-1/2 inches tall.  On admission, her temp was 98.3, pulse 95, respirations 16, blood pressure 129/103.  MENTAL STATUS EXAMINATION:  This is a small stature, thin white female, disheveled and fatigued in appearance, who is calm and cooperative and is polite.  Her affect is mildly anxious and blunted.  Speech is normal in pace and tone.  Her mood is depressed and anxious.  No suicidal ideation, homicidal ideation, no auditory or visual hallucinations.  Thought process is logical and goal directed.  She clearly minimizes her drug dependence, stating "I only take a few more than are prescribed for me, and these are prescribed drugs. If I was a drug addict I would be getting drugs off the street."  Cognitively, patient is oriented x 3 and is intact.  ADMISSION DIAGNOSES: Axis I:    1. Opiate dependence.            2. Benzodiazepine dependence.            3. Major depression, recurrent, moderate. Axis II:   Deferred. Axis III:  Status post gastric resection for ulcer, chronic obstructive            pulmonary disease, and chronic pancreatitis. Axis IV:   Moderate, psychosocial problems in recognizing and dealing with            her own substance dependence. Axis V:    Current is 47, past year 49.  INITIAL PLAN OF CARE:  To admit the patient to detox her from opiates and benzos and to stabilize her mood.  We will start Celexa 10 mg p.o. daily for her  complaints of depression.  We will request a copy of the PE done in the [facility] emergency room on April 21, 2000.  We will push fluids including  Gatorade and Ensure between meals.  We will give her extra Tylenol for pain and Librium for feelings of anxiety.  We will start her on a Wytensin protocol, and on Klonopin 1 mg b.i.d., which we will taper to address her benzodiazepine dependence.  We will get anemia studies done based on her low hemoglobin and hematocrit and low RBCs.  We will contact Dr. Hessie Diener to get a list of her current medications and dosages.  We will do pulse ox with her vital signs  at least for today to make sure she is saturating satisfactorily, and we will check a urine drug screen on her if that was not already done in the ER.  We do not see evidence of those results at this point, so we will validate that.  TENTATIVE LENGTH OF STAY:  Four to five days. DD:  04/22/00 TD:  04/22/00 Job: [id number]

## 2010-05-22 NOTE — H&P (Signed)
[facility]  Patient:    [name], [name]                  MRN: [id number] Adm. Date:  [id number] Disc. Date: [id number] Attending:  Devoria Albe                   Psychiatric Admission Assessment  DATE OF ADMISSION:  July 23, 2000  PATIENT IDENTIFICATION:  This is the second or third admission to [facility] for this 60 year old female who was admitted after she claimed to have suicidal ideation wanting to kill herself by throwing herself in front of a car.  HISTORY OF PRESENT ILLNESS:  The patient claims that she has been more depressed in the last three months.  At the same time, she has been using more of her benzodiazepine as well as her pain medications; no particular reasons why.  Claims when she is more depressed, she uses more pills.  In the last couple of days, she ran out of medications so she has been withdrawing, having a very difficult time, a lot of physical complaints.  She has recurred to drinking more and as she ran out of pills, the amount of alcohol has been increasing.  She reported to be more depressed from sleeping all day long; she is not sleeping now.  She has lost a lot of weight, decreased appetite with 40 pounds lost in three months.  No energy, no motivation, wanting to die.  Can not identify a trigger but exacerbation of her depression.  She also reports anxiety attacks, panic attacks in nature.   She has been given Ativan 1 mg twice a day, Vicodin 5 mg every four to six hours, Librium 10 mg and she has been overusing it.  Claims that has used over 280 pain pills, 90 nerve pills, 25 Dilaudid suppositories in two weeks.  She ran out of medications two days prior to this admission.  She was involuntarily committed to this unit.  PAST PSYCHIATRIC HISTORY:  She was at [facility] as well as at [facility] before.  Basically treated by her primary physician.  She has seen a  Phillips Climes at [facility] but she missed an appointment in May 2002.  She has been a client of [facility] since 1996.  Claims to have been tried on Elavil, Prozac, Zoloft, Paxil, Effexor, Wellbutrin, and Serzone as well as Risperdal and Zyprexa, but not clear how long she stayed on these medications and how high they went on the dosage.  SUBSTANCE ABUSE HISTORY:  Extensive history use of benzodiazepines and pain medications.  More recently, increased use of alcohol.  PAST MEDICAL HISTORY:  Chronic leg pain, history of COPD, history of ulcers and pancreatitis.  DRUG ALLERGIES:  THORAZINE.  PHYSICAL EXAMINATION:  Performed at the emergency room.  MEDICATIONS ON ADMISSION:  She was being prescribed Ativan 1 mg twice a day, Librium 10 mg every day, Vicodin 5 mg one every four to six hours as needed for pain, but she was overusing these medications.  SOCIAL HISTORY:  She has adult children, lives with her husband.  On disability, unable to work, no particular activities, pretty much homebound.  FAMILY HISTORY:  Two brothers with depression, father is an alcoholic.  MENTAL STATUS EXAMINATION:  Well-nourished, well-developed, alert, cooperative female, some psychomotor retardation. She claims that she feels shaky and she looks shaky.  Speech:  She has  a very slow production.  She is basically answering what she is asked for and is  basically goal-directed.  Mood: Depression.  Affect:  Depression.  Thought process is logical, coherent and relevant, does deal with the symptoms -- increasing of medications.  Denied active suicidal ideas at this particular time.  Does express that of shame in terms of what has happened with her escalating use of these medications as well as alcohol.  Claims that she wants to get herself together especially now that her doctor knows that she is abusing medications and she is not going to get anymore.   Cognition intact intact to person, place, and time.  Recent and remote memories were well preserved.  ADMISSION DIAGNOSES: Axis I:    1. Opioid and benzodiazepine dependence.            2. Alcohol abuse, rule out dependence.            3. Depressive disorder, not otherwise specified.            4. Anxiety disorder, not otherwise specified. Axis II:   Deferred. Axis III:  1. Leg pain.            2. Chronic obstructive pulmonary disease.            3. Status post pancreatitis by history. Axis IV:   Code 3. Axis V:    Upon admission 25 to 30, highest global assessment of functioning            in last year 62.  INITIAL PLAN OF CARE:  We are going to detoxify using phenobarbital as well as Wytensin.  We are going to rule out psychological morbidities.  We are going to work on establishing a recovery program as well as relapse prevention program and we are going to explore the possibility of using a combination of Celexa that she has been taking and Neurontin that might help with anxiety as well as with the chronic pain.  Once stabilized, we are probably going to refer her back to [facility] and encourage her participation in the intensive outpatient program. DD:  07/23/00 TD:  07/24/00 Job: [id number]

## 2010-05-22 NOTE — Discharge Summary (Signed)
[place]  Patient:    [name], [name]                      MRN: [id number] Adm. Date:  [id number] Disc. Date: [id number] Attending:  Eino Farber Dictator:   Dionne D. Su Hilt, N.P.                           Discharge Summary  ADMISSION DIAGNOSES: 1. Acute pancreatitis, recurrent, with vomiting. 2. Pyloric channel peptic ulcer disease. 3. Chronic obstructive pulmonary disease with asthma. 4. Myofascial pain syndrome. 5. Anxiety and depression disorder.  REASON FOR HOSPITALIZATION:  The patient is a 60 year old white female with known history of recurrent acute pancreatitis and pyloric peptic ulcer disease who developed persistent epigastric pain for two days which increased this a.m. on October 12, 1999, with uncontrolled vomiting and she was admitted from the emergency department.  ALLERGIES:  THORAZINE AND ASPIRIN intolerance.  STUDIES:  On October 13, 1999, EKG showed a normal sinus rhythm with short PR.  On October 12, 1999, CBC with differential showed wbc of 11, hemoglobin 13.1, hematocrit 37.0, platelets 312.  On October 12, 1999, chemistry showed sodium of 140, potassium 3.8, chloride 109, CO2 22, glucose 106, BUN 10, creatinine 0.6, calcium 9.2, total protein 6.9, albumin 3.6, AST 14, ALT 13, alkaline phosphatase 101, total bilirubin 0.7, amylase 151, lipase 27.  On October 15, 1999, chemistry studies showed sodium 141, potassium 3.7, chloride 107, CO2 29, glucose 98, BUN 7, creatinine 0.5 and calcium 8.6.  On October 16, 1999, amylase 83.  HOSPITAL COURSE:  The patient was admitted.  IV therapy was initiated with KCl.  The patient had episode of hypokalemia.  Also she was started on a clear liquid diet. She was given Dilaudid with Phenergan for pain and nausea. Pepcid 20 mg IV was added to her plan of care. The patient was started on Advair 100/50 q.a.m. and q.h.s. for COPD and asthma, also albuterol inhalers were added to  her plan of care. Serum amylase and lipase were drawn and obtained at intervals to monitor acute pancreatitis.  Also, she was started on Reglan 10 mg IV a.c. meals and h.s.  We had difficulty with maintaining peripheral IV lines so her medications were changed to the oral route. However, the patient continued to have increased nausea and vomiting and was not able to tolerate the advancement to full liquid diet; therefore a central line was placed by Luisa Hart L. Lurene Shadow, M.D.  IV therapy was resumed with the IV medications of Reglan and Pepcid and Dilaudid and Phenergan.  Chemistries were obtained at intervals to monitor hypokalemia. She was given Robitussin DM for her cough and she was also started on Nexium 40 mg capsules.  Her pain improved and she was able to decrease the Dilaudid and Phenergan.  Flovent was added to her plan of care.  DISCHARGE CONDITION:  The patient was discharged to home in stable condition with improvement in pain, nausea and vomiting.  Her diet was able to be advanced to a regular diet.  DISCHARGE MEDICATIONS: 1. Flovent two puffs q.a.m. 2. Lorcet Plus one q.6h. p.r.n. for pain.  DISCHARGE INSTRUCTIONS: 1. Activity as tolerated. 2. Diet:  Low fat diet.  FOLLOW-UP:  Call Eino Farber., M.D.s, office for an appointment. DD:  11/05/99 TD:  11/05/99 Job: [id number]

## 2010-05-22 NOTE — H&P (Signed)
[name], [name]                         ACCOUNT NO.:  [id number]   MEDICAL RECORD NO.:  [id number]                   PATIENT TYPE:  EMS   LOCATION:  ED                                   FACILITY:  [facility]   PHYSICIAN:  Corinna L. Lendell Caprice, MD             DATE OF BIRTH:  1950-07-18   DATE OF ADMISSION:  07/03/2003  DATE OF DISCHARGE:                                HISTORY & PHYSICAL   CHIEF COMPLAINT:  Abdominal pain.   HISTORY OF PRESENT ILLNESS:  [name] is a 60 year old white female with  recurrent abdominal pain with a history of gastritis and pancreatitis who  presents to the emergency room with epigastric pain. She reports that it has  been getting worse over the past few days, but was much worse today.  She  usually has vomiting with these episodes, but has had none today. She also  has reported some hematochezia periodically over the past two weeks.  She  takes Vicodin chronically at home and has about three left today. She had an  appointment with Dr. Ronne Binning on Friday, but presented to the emergency room  today because her pain was so bad.  Apparently there are some problems with  Dr. Dimas Millin privileges and so I have been asked to admit the patient as  an unassigned. She denies fevers, chills. She reports that she has had part  of her stomach removed for what sounds like ulcers or gastritis.   PAST MEDICAL HISTORY:  1. Recurrent and chronic pancreatitis.  2. Gastritis.  3. Asthma.  4. History of alcohol abuse.  5. History of tobacco abuse.  6. Chronic narcotic and benzodiazepines.   MEDICATIONS:  1. Ativan 1 mg p.o. t.i.d.  2. Vicodin one p.o. t.i.d.  3. Zofran as needed.   SOCIAL HISTORY:  The patient used to drink very heavily, but reports being  clean from alcohol for eight months. She smokes a pack of cigarettes a day.  She is married.   FAMILY HISTORY:  Her father died of her pancreatitis. Her mother has heart  problems.   REVIEW OF SYSTEMS:  As  above. Otherwise, negative.   PHYSICAL EXAMINATION:  VITAL SIGNS: Her temperature is 98.6, blood pressure  120/58, pulse 87, respiratory rate 16, oxygen saturation 98% on room air.  GENERAL: The patient is a thin white female who appears older than her  stated age.  HEENT: Normocephalic and atraumatic. Pupils equal, round, and reactive to  light. Moist mucous membranes.  NECK: Supple with no lymphadenopathy. No thyromegaly or JVD.  LUNGS: Clear to auscultation bilaterally without rales, rhonchi, or wheezes.  CARDIOVASCULAR: Regular rate and rhythm without murmurs, rubs, or gallops.  ABDOMEN: Normal bowel sounds, soft, minimally tender in the epigastrium.  GU: Deferred.  RECTAL EXAM: Per ER nurse practitioner showed heme negative stool and no  mass.  EXTREMITIES: No clubbing, cyanosis, or edema.  NEUROLOGIC: The patient is alert  and oriented. Cranial nerves and  sensorimotor exam are intact.  SKIN: No rash.  PSYCHIATRIC: Normal affect.   LABORATORY DATA:  White count 5, hemoglobin 7.4, hematocrit 24, MCV 76,  platelet count 334,000.  Complete metabolic panel is normal. Amylase 98,  lipase 26. CPK, MB, and troponin normal. UA negative.   ASSESSMENT/PLAN:  1. Abdominal pain most likely acute exacerbation of chronic pancreatitis.     Also consider ulcer or gastritis. The patient will be admitted to the     floor. She will get IV fluids. She will be NPO except for ice chips. She     will get IV Protonix and Dilaudid.  2. Microcytic anemia with reported bright red blood per rectum over two     weeks. She was heme negative today, but I will consult [facility] who is     on call for unassigned here at [facility].  She may need     endoscopy. She will be transfused two units of packed red blood cells. I     will check a PT, PTT, ferritin, and a repeat H&H after transfusion.  3. Chronic Vicodin and chronic Ativan use.  4. History of alcoholism, clean for eight months.  5. Tobacco  abuse, counseled against.                                               Corinna L. Lendell Caprice, MD    CLS/MEDQ  D:  07/03/2003  T:  07/03/2003  Job:  [id number]   cc:   Lorelle Formosa, M.D.  [phone] [address]. [address]  [place]  [place] [address]  Fax: [phone]
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
## 2010-05-22 NOTE — Discharge Summary (Signed)
[name], [name]               ACCOUNT NO.:  [id number]   MEDICAL RECORD NO.:  [id number]          PATIENT TYPE:  INP   LOCATION:  1425                         FACILITY:  [facility]   PHYSICIAN:  Hettie Holstein, D.O.    DATE OF BIRTH:  1950-01-07   DATE OF ADMISSION:  03/08/2006  DATE OF DISCHARGE:  03/11/2006                               DISCHARGE SUMMARY   PRIMARY CARE PHYSICIAN:  Dr. Billee Cashing.   FINAL DIAGNOSIS:  Acute-on-chronic pancreatitis.   ADDITIONAL DIAGNOSES:  1. History of alcohol abuse in the past.  2. History of a peptic ulcer disease status post partial gastrectomy.  3. Chronic obstructive pulmonary disease with ongoing tobacco abuse.  4. History of gastroparesis and delayed gastric emptying as of a study      performed in December 2006.  5. Chronic anxiety.  6. Status post hysterectomy.  7. Chronic anemia.  8. Urinary tract infection.   MEDICATIONS ON DISCHARGE/SHE WILL BE DISCHARGE ON::  1. Cipro 250 mg twice daily dispense #6.  2. Dilaudid 2 mg one to two tablets four times a day as needed      dispense #40 tablets with no refills.  3. Creon 10 one to two caps with meals.  4. Ativan 1 mg three times a day as needed and as previously      prescribed by Dr. Billee Cashing.  No prescription was provided      on discharge.  5. Prilosec 20 mg daily.  6. Reglan 10 mg every 6 hours as needed for nausea.   DISPOSITION:  At present, [name] is felt to be medically stable for  discharge to follow up with Dr. Ronne Binning in 1-2 weeks.  Her pain is well  controlled and she tolerating diet well.   LABORATORY DATA:  Her urine cultured revealed Escherichia coli.  Final  sensitivities are pending.  She has responded to empiric doses of  ciprofloxacin.  Her primary care physician may follow up on the final  culture and sensitivity results.  Her TSH was 1.786.  Basic metabolic  panel revealed a sodium of 141, potassium 3.6, chloride 112, CO2 25, BUN  7,  creatinine 0.5, glucose of 84.  Her CBC revealed a WBC of 7.6,  hemoglobin of 11, MCV of 90, platelet count of 180.  Her INR was 0.9.  Her lipase was 24.  Her plain films of her chest as well as abdomen  revealed COPD though no active lung disease, there was no obstruction or  free air on the abdominal film.   HISTORY OF PRESENTING ILLNESS:  For full details please refer to the H&P  as dictated by Dr. [name].  However briefly, [name] is a 60-  year-old female with a past medical history significant for alcohol  abuse, chronic pancreatitis, peptic ulcer disease and COPD who presented  to the emergency department with a chief complaint of abdominal pain,  nausea and vomiting that had begun 2 days previously predominantly  localized to the midepigastric area described as moderate to severe in  intensity.  In any event, she had  reported some vomiting.  She admits to  drinking some alcohol but did not in a large amount as she had in the  past.  In any event, she was admitted for further management.   HOSPITAL COURSE:  Mrs. Chakraborty was admitted and underwent initial  evaluation, was felt to be suffering from acute flare of her chronic  pancreatitis and perhaps some contribution of gastroparesis.  She was  started on IV fluids, held n.p.o. and administered IV narcotics.  Her  course was that of gradual but slow improvement.  Eventually she was  able to advance in her diet and tolerate p.o. quite well.  Discussions  were undertaken with reference to abstinence with regard to alcohol as  well as tobacco.  I have encouraged her to follow up with her primary  care physician, Dr. Billee Cashing and to abstain from tobacco and  alcohol.      Hettie Holstein, D.O.  Electronically Signed     ESS/MEDQ  D:  03/11/2006  T:  03/11/2006  Job:  [id number]   cc:   Lorelle Formosa, M.D.  Fax: [phone]

## 2010-05-22 NOTE — Discharge Summary (Signed)
[name], [name]                         ACCOUNT NO.:  [id number]   MEDICAL RECORD NO.:  [id number]                   PATIENT TYPE:  INP   LOCATION:  5023                                 FACILITY:  [facility]   PHYSICIAN:  Ileana Roup, M.D.               DATE OF BIRTH:  [birth year]   DATE OF ADMISSION:  07/31/2001  DATE OF DISCHARGE:  08/03/2001                                 DISCHARGE SUMMARY   DISCHARGE DIAGNOSES:  1. Alcohol abuse.  2. Anxiety disorder.  3. Alcoholic gastritis.  4. History of benzodiazepines and opiates abuse.  5. Chronic obstructive pulmonary disease.  6. Tobacco abuse.  7. Anemia.   DISCHARGE MEDICATIONS:  1. Protonix 40 mg p.o. q.d.  2. Combivent 2 puffs q.i.d.  3. Vitamin B1 p.o. q.d.  4. Folic acid p.o. q.d.   CONSULTATIONS:  During this admission with Dr. Adelene Amas. [place],  psychiatry.   CONDITION ON DISCHARGE:  The patient was discharged in excellent condition.   FOLLOW UP:  She was advised to follow up in the outpatient clinic on the 8th  of August of 2003 with Lonia Blood, M.D.  An appointment was scheduled for  her at 2:40 p.m., and then she had an appointment scheduled on the 31 of  July at 1:30 p.m. in the [facility] outpatient facility  for alcohol abuse advice.   HISTORY OF PRESENT ILLNESS:  This is an 60 year old Caucasian woman with a  history of a gastritis, a duodenal ulcer, status post vagotomy with ongoing  ethanol abuse, history of benzodiazepine and opiate abuse with multiple  admissions at [facility] at the county mental facility in  [facility] in [place], and in other mental facilities and also at  [facility] and [facility] for the same complaint of abdominal  pain.  Repeated workup failed to show any kind of acute pancreatitis or  chronic pancreatitis.  She had an endoscopy which showed only minor changes  of gastritis.  She was discharged just one week ago in good  condition  without any abdominal pain or nausea and vomiting and she stayed stable for  two days, but then she started again drinking alcohol and she got nauseated  and vomiting and the epigastric pain recurred.  She was seen in the ER and  she was admitted for a possible pancreatitis.   PAST MEDICAL HISTORY:  Duodenal ulcer, gastritis, and COPD.   ALLERGIES:  Thorazine.   FAMILY HISTORY:  Noncontributory.   SOCIAL HISTORY:  She is smoking two packs a day.  She is drinking alcohol  six beers a day.  She has a husband that has also an alcohol problem.   REVIEW OF SYSTEMS:  Positive for mild headache.  No chest pain or shortness  of breath.  Mild cough.  No diarrhea, no constipation or leg swelling.   PHYSICAL EXAMINATION:  VITAL SIGNS:  Temperature 98.7, blood pressure  150/100, respiratory rate 16, heart rate 100.  98% on room air.  GENERAL:  The patient lying in bed sleeping comfortably.  Upon awakening  her, she complained of abdominal pain but she could easily walk around and  go outside smoking.  HEENT:  Pupils equal, round, reactive to light and accommodation.  Extraocular movements intact.  NECK:  No JVD, no carotid bruits.  HEART:  Regular rate and rhythm, no murmur, no gallop.  LUNGS:  Clear to auscultation  bilaterally.  ABDOMEN:  Soft, nontender, nondistended, bowel sounds are present.  EXTREMITIES:  No edema.  Pulses present.   LABORATORY DATA ON ADMISSION:  Sodium 137, potassium 3.4, chloride 107,  bicarb 22, BUN 10, creatinine 0.5, alkaline phosphatase 97, total bili 0.7,  amylase 70, lipase 24, ALT 15, AST 21.  White blood cells 8.5, hemoglobin  9.9, hematocrit 31.2, _________ 59.   HOSPITAL COURSE:  1. ALCOHOL ABUSE, HISTORY OF OPIATES AND BENZODIAZEPINE ABUSE:  We consulted     Dr. Jeanie Sewer who thought that she might benefit from an inpatient     admission to a behavior health facility.  Unfortunately, she  is on Do     Not Admit list on every behavior health  facility in this state because of     repeated failure to follow up with the treatment and abuse of all the     prescription medications that she was given.  She agreed for an     outpatient course of alcohol rehab at the [facility] and to try to     get help from the AA in the community.  I strongly advised her once again     to quit drinking, stay sober, followup with the medical team.   1. NAUSEA AND VOMITING:  We admitted with Phenergan and cessation of alcohol     and restarting with p.o. feeding without any kind of event.  No recurring     of nausea and vomiting.   1. ANEMIA:  The workup for this anemia was deferred for as an outpatient.  I     will do that when she returns to visit in the outpatient clinic.  The MCV     was 79.7.  She was not actively bleeding through the rectum.  I will do     iron studies and give her iron by mouth.   1. CHRONIC OBSTRUCTIVE PULMONARY DISEASE:  With her ongoing smoking, it is     hard to control the symptoms but she did not complain of shortness of     breath or increase in coughing.  She continued her inhaler of Combivent.   1. TOBACCO ABUSE:  The patient was not willing to quit as she said quitting     alcohol is enough for her at this point.     Lonia Blood, M.D.                          Ileana Roup, M.D.    SL/MEDQ  D:  08/08/2001  T:  08/12/2001  Job:  [id number]

## 2010-05-22 NOTE — Discharge Summary (Signed)
[name], [name]               ACCOUNT NO.:  [id number]   MEDICAL RECORD NO.:  [id number]          PATIENT TYPE:  INP   LOCATION:  1324                         FACILITY:  [facility]   PHYSICIAN:  Madaline Savage, MD        DATE OF BIRTH:  03-07-1950   DATE OF ADMISSION:  09/04/2005  DATE OF DISCHARGE:  09/07/2005                                 DISCHARGE SUMMARY   PRIMARY CARE PHYSICIAN:  Lorelle Formosa, M.D.   DISCHARGE DIAGNOSES:  1. Alcohol related seizures.  2. Chronic pancreatitis.  3. History of peptic ulcer disease with history of partial gastrectomy in      the past.  4. Chronic anemia.  5. Chronic obstructive pulmonary disease.   DISCHARGE MEDICATIONS:  1. Ativan 1 mg 3 times daily.  2. Nexium 150 mg twice daily.  3. Dilaudid 4 mg every 6 hours as needed.  4. Zofran 4 mg twice daily as needed.  5. Megace for appetite.   HOSPITAL COURSE:  [name] is a 60 year old Caucasian lady with a history  of chronic pancreatitis which is related to her alcohol consumption in the  past and who had alcohol related seizures in the past comes in after an  episode of seizure.  She states she has had up 50 seizures in the past all  of them related to either stopping or starting alcohol use.  When she was in  the ER, she was hemodynamically stable.  Her initial blood work was within  normal limits.  She was not started on any antiepileptic medications as this  has never been started on her.  She was difficult to get IV access.  A PICC  line was ordered and a PICC line was placed and blood work was drawn.  Her  lipase drawn was only 53 which is marginally elevated.  However, abdominal  pain since then has gotten better.  Now she is being discharged home in a  stable condition.  On admission and EEG was ordered but because she was  admitted during the holiday weekend, the EEG is getting done today.  She  will follow up with her primary care physician with the EEG results.  She is  now  being discharged home in a stable condition.   FOLLOWUP:  An EEG done on September 07, 2005, results she will follow up with  her primary care doctor, Dr. Lorelle Formosa.      Madaline Savage, MD  Electronically Signed     PKN/MEDQ  D:  09/07/2005  T:  09/07/2005  Job:  [id number]   cc:   Lorelle Formosa, M.D.  Fax: [phone]

## 2010-05-22 NOTE — Discharge Summary (Signed)
NAMEPIHU, BASIL               ACCOUNT NO.:  [id number]   MEDICAL RECORD NO.:  [id number]          PATIENT TYPE:  INP   LOCATION:  1328                         FACILITY:  [facility]   PHYSICIAN:  Mobolaji B. Bakare, M.D.DATE OF BIRTH:  24-Feb-1950   DATE OF ADMISSION:  06/16/2005  DATE OF DISCHARGE:  06/19/2005                                 DISCHARGE SUMMARY   PRIMARY CARE PHYSICIAN:  Dr. Ronne Binning.   FINAL DIAGNOSES:  1.  Abdominal pain secondary to chronic pancreatitis.  2.  Nausea and vomiting secondary to gastroparesis.  3.  Tobacco abuse.  4.  Hypokalemia.   PROCEDURES:  Peripherally inserted central catheter line placement on the  14th of June 2007.   BRIEF HISTORY:  Mrs. Macfarlane is a pleasant 60 year old Caucasian female who  has history of alcohol abuse in the past.  She has a history of chronic  pancreatitis.  The patient presented with nausea and vomiting and inability  to keep anything down.  There was associated epigastric pain.  She denied  using alcohol recently.  There was no hematochezia or hematemesis.   PHYSICAL EXAMINATION:  Initial vitals in the emergency room were temperature  98.7, blood pressure 141/90, pulse of 107, respiratory rate of 18, oxygen  saturation of 96%.  Significant finding was on abdominal examination.  There  was mild epigastric tenderness without rebound or guarding.   LABORATORY DATA:  Laboratory data revealed normal LFTs, BUN, creatinine and  white cell count.  She had a normal lipase of 25.  She was subsequently  admitted for pain management and IV fluids.   HOSPITAL COURSE:  PROBLEM #1 - ABDOMINAL PAIN, LIKELY SECONDARY TO CHRONIC  PANCREATITIS AND PROBABLE GASTRITIS:  The patient was conservatively managed  with n.p.o., intravenous fluid and PPI.  She was also given a narcotic  analgesia, which she did not require often.  Diet was gradually advanced and  she was able to tolerate p.o.  The patient improved prior to discharge.   PROBLEM #2 - HYPOKALEMIA:  This was repleted.   DISCHARGE CONDITION:  Stable and improved.   DISCHARGE MEDICATIONS:  1.  Dilaudid 4 mg p.o. q.4 h. p.r.n.  2.  Trazodone 50 mg nightly p.r.n. for sleep.  3.  Nicotine patch 21 mg daily.  4.  Ativan 1 mg 3 times a day.  5.  Reglan 10 mg 3 times a day.  6.  Phenergan 12.5 mg to 25 mg p.r.n. as needed for nausea and vomiting.  7.  Protonix 40 mg b.i.d.   FOLLOWUP:  Follow up with Dr. Ronne Binning in 1-2 weeks.      Mobolaji B. Corky Downs, M.D.  Electronically Signed     MBB/MEDQ  D:  06/28/2005  T:  06/28/2005  Job:  [id number]   cc:   Lorelle Formosa, M.D.  Fax: [phone]

## 2010-05-22 NOTE — Discharge Summary (Signed)
[facility]  Patient:    [name], [name] Visit Number: [id number] MRN: [id number]          Service Type: MED Location: [phone] Attending Physician:  Gracelyn Nurse Dictated by:   Gracelyn Nurse, M.D. Admit Date:  06/04/2001 Discharge Date: 06/09/2001   CC:         [facility]   Discharge Summary  DISCHARGE DIAGNOSES: 1. Acute pancreatitis.    a. History of multiple admissions.    b. Secondary to alcohol use. 2. Right foot cellulitis.    a. Secondary from intravenous site from previous admission.    b. Home health care to do bandage changes each day. 3. Sacral decubitus stage I. 4. Alcohol abuse. 5. History of peptic ulcer disease.    a. Status post partial gastrectomy.  DISCHARGE MEDICATIONS: 1. Vicodin 1-2 q.4h. p.r.n. 2. Phenergan 25 mg q.4h. p.r.n. 3. Ativan 1 mg t.i.d. p.r.n. 4. Keflex 500 mg t.i.d. x 10 days.  HISTORY OF PRESENT ILLNESS:  This is a 60 year old white female with history of multiple admissions for pancreatitis.  She started complaining about three days ago of abdominal pain and nausea and vomiting.  She has been drinking three quarts of beer a day.  She had an alcohol level of 248 tonight.  PROCEDURES:  MRCP, which was normal.  HOSPITAL COURSE: #1 - ACUTE PANCREATITIS:  As stated above, she has had multiple admissions for this secondary to her alcohol use.  She was made n.p.o., given IV fluids, pain medications, and antiemetics.  She was still complaining about pain and nausea in the afternoon before discharge.  The nurses found her in the cafeteria eating a hot dog.  She tolerated this well, so we went ahead and changed everything to p.o. and started her on a regular diet.  She tolerated this well.  The next morning she was discharged.  She was counseled on alcohol use and warned that she could have recurrent pancreatitis from that.  #2 - RIGHT FOOT CELLULITIS:  She had an area that had an  eschar and some erythema around it.  She said this was an IV site from a previous admission. We had wound care nursing see this, and they recommended daily dressing changes which will be done by home health nursing on discharge.  She will also receive oral antibiotics.  She received IV antibiotics while here in the hospital.  #3 - SACRAL DECUBITUS:  She does have a stage I, and she was advised to keep off this area as much as possible.  #4 - ALCOHOL ABUSE:  Again she was counseled on this, which she has been in the past but has not refrained from drinking.  DISCHARGE LABORATORY DATA:  Sodium 139, potassium 3.8, chloride 109, CO2 24, BUN 5, creatinine 0.5, glucose 91.  CONDITION ON DISCHARGE:  Stable.  DISCHARGE INSTRUCTIONS:  The patient was advised to call the outpatient clinic on [facility] where she is seen for follow-up appointment if needed. Dictated by:   Gracelyn Nurse, M.D. Attending Physician:  Marcelino Duster D DD:  06/09/01 TD:  06/12/01 Job: [id number]

## 2010-05-22 NOTE — Discharge Summary (Signed)
[place]  Patient:    [name], [name]                      MRN: [id number] Adm. Date:  [id number] Disc. Date: [id number] Attending:  Eino Farber Dictator:   Dionne D. Su Hilt, N.P.                           Discharge Summary  ADMISSION DIAGNOSES: 1. Acute pancreatitis, recurrent, clinical. 2. Hypokalemia. 3. Vomiting due to acute pancreatitis. 4. Chronic obstructive pulmonary disease, severe. 5. Pyloric peptic ulcer disease. 6. Anxiety and depression disorder.  DISCHARGE DIAGNOSES: 1. Acute pancreatitis, recurrent. 2. Vomiting. 3. Hypokalemia, corrected. 4. Pyloric peptic ulcer disease. 5. Chronic obstructive pulmonary disease with asthma component.  REASON FOR HOSPITALIZATION:  The patient is a 60 year old white female with known relapsing recurrent acute pancreatitis.  She is admitted from the emergency department with severe epigastric pain with uncontrolled vomiting and is unable to control pain and vomiting with p.o. medications.  So far uncontrolled with IV medications.  ALLERGIES:  No known drug allergies.  LABORATORY DATA AND STUDIES:  EKG on September 01, 1999 showed normal sinus rhythm, nonspecific ST-T wave abnormality.  Abdominal x-ray with chest showed a normal bowel gas pattern. No active cardiopulmonary disease. On September 01, 1999 WBC was 13.0, hemoglobin 14.3, hematocrit 41.7, platelets 399. On September 01, 1999 sodium was 139, potassium 3.4, chloride 107, CO2 24, glucose 112, BUN 18, creatinine 0.9, calcium 9.6, total protein 7.6, albumin 3.8, AST 17, ALT 21, LP 84, total bilirubin 0.7, amylase 86, lipase 38.  On September 02, 1999 amylase was 53, lipase was less than 10.  Urinalysis was normal.  HOSPITAL COURSE:  The patient was admitted. IV therapy was initiated with 20 mEq of KCl.  Dilaudid with Phenergan was given for severe pain.  The patient was started on Pepcid 20 mg IV q.12h.  For COPD, the patient was  given albuterol nebulizer treatments 2.5 q.6h.  Amylase and lipase levels were obtained which were within normal levels.  The patient was hypokalemic. Potassium supplements were given intravenously.  Chemistry studies were done to maintain high potassium level.  The patient was able to go npo and was advanced to a full liquid diet.  The patient continued to improve with less vomiting and less pain.  We were able to discontinue the IV Pepcid and start her on Nexium 40 mg capsule p.o. q.d. and was able to give Vicodin tablet. HER diet continued to increase to a low fat, no caffeinated soft diet.  Her Phenergan was changed to suppository route.  The Nexium was increased to 40 mg p.o. q.a.m. and q.h.s.  CONDITION ON DISCHARGE:  The patient was discharged in stable condition.  She continued to improve with nausea and vomiting improved with Phenergan suppository.  DISCHARGE MEDICATIONS: 1. Proventil HFA metered dose inhaler one to two puffs q.i.d. p.r.n. for    chest tightness. 2. Serevent metered dose inhaler two puffs q.a.m. and q.h.s. 3. Flovent metered dose inhaler 110 inhaler two puffs q.d. 4. Vicodin tablet 1 q.6h. p.r.n. for pain. 5. Phenergan suppository 25 mg insert one q.8h. p.r.n. for nausea and    vomiting. 6. Nexium 40 mg capsule one q.a.m. and q.h.s. 7. Amitriptyline 50 mg tablet one or two q.h.s. for rest. 8. Lorazepam 1 mg tablet one t.i.d. for tension.  DISCHARGE INSTRUCTIONS:  Activity is as tolerated.  Diet:  No caffeinated beverages.  Low fat diet.  SPECIAL INSTRUCTIONS: Discontinue smoking.  FOLLOW UP APPOINTMENTS:  September 30, 1999 at 3 p.m. DD:  10/27/99 TD:  10/28/99 Job: [id number]

## 2010-05-22 NOTE — Discharge Summary (Signed)
[place]  Patient:    [name], [name]                      MRN: [id number] Adm. Date:  [id number] Disc. Date: 01/24/00 Attending:  Eino Farber CC:         Mardene Celeste Lurene Shadow, M.D., General Surgeon  Roosvelt Harps, M.D., Gastroenterologist   Discharge Summary  DISCHARGE DIAGNOSES:  1. Peptic ulcer disease prepyloric with intractable epigastric pain and     partial outlet obstruction with recurrent vomiting surgically treated.  2. Peptic ulcer disease recurrent postoperative partial gastrectomy and     vagotomy with periostoma peptic ulcer.  3. Gastroparesis with persistent nausea and vomiting of IV medications     probably aggravated by narcotic and sedative medications.  4. Chronic obstructive pulmonary disease with asthma, controlled by inhaled     bronchodilator medications.  5. Pancreatitis recurrent by history with an old history of alcohol abuse but     not recently.  6. 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  7. Tobacco dependency with continued use.  8. Anxiety and depression disorder, fair control.  BRIEF HISTORY:  The patient is a 60 year old white female with a long history of peptic ulcer disease for over 15 years.  Patient has on several occasions been found to have persistent pyloric peptic ulcer disease in spite of H2 blocker medications such as Zantac in the past and then proton pump inhibitors with the most current use of protonix without control of her peptic ulcer disease and nausea and vomiting.  The patient has been followed in the past by Dr. Ilda Basset, gastroenterology group, with the documentation of her peptic ulcer disease but they have terminated their management at this point.  The patient has not been compliant and still smokes.  The patient has been evaluated in the past by her general surgeon Danny Lawless, M.D., who agreed to do a surgical procedure to treat her persistent  peptic ulcer disease because of intractable epigastric pain and intractable nausea and vomiting. The patient presented to the emergency room on January 04, 2000, after recurrent presentations trying to control her condition with IV Reglan and IV protonix as well as IV Phenergan hoping that this would help heal the ulcer and control her symptoms.  However, the control of symptoms did not occur. The patient was admitted on my service with the attempt to try to give her some relief of symptoms and will proceed with the surgical procedure for treatment of her peptic ulcer disease.  PAST HISTORY:  The patient has a past history of recurrent pancreatitis going back approximately 20 years under the treatment of several [facility] physicians.  The patients recent symptoms, however, appear to be more related to her active peptic ulcer disease and suspected clinically gastroparesis condition and bile hypomotility.  The patient has not had elevation in her pancreatic enzymes for over 12 months, however, some of her symptoms were thought at times to be due to inflamed pancreas.  SOCIAL HISTORY:  The patient still smokes tobacco but has not been noted to be under the influence of alcohol for over 2 years.  FAMILY HISTORY:  The patient has family history of being in a stressful environment and has two adult children by a previous marriage.  She is presently married in a stressful situation at times.  REVIEW OF SYSTEMS:  The patients head, eyes, ears, nose, and throat reveals some  dental extractions and dental cavities and dental fillings otherwise unremarkable.  Her cardiopulmonary symptoms reveal some episodes of frequent cough and chest tightness, that is controlled by her bronchodilator medications of albuterol metered dose inhaler and Flovent 110 metered dose inhaler as well as Tussionex suspension 5 cc q.12. hours, cough medications.  ALLERGIES:  The patient is sensitive to THORAZINE with a  pseudotoxin type reaction.  PERTINENT PHYSICAL FINDINGS:  GENERAL CONDITION:  January 04, 2000:  The patient was observed to be a well-developed well-nourished white female appearing to be in severe pain on the [facility] emergency department and was oriented to time, place and person.  VITAL SIGNS:  Vital signs on admission revealed a temperature of 98.0, pulse of 96 and regular, respirations of 20, and a blood pressure of 128/74.  HEENT:  Revealed multiple dental extractions with some dental fillings, and dental cavities, otherwise unremarkable.  NECK:  Revealed the neck to be supple with no masses, or thyroid enlargement.  BREASTS:  The breasts reveal no masses.  LUNGS:  The lung revealed mild prolongation of expiration, and minimal similar rhonchi with cough.  HEART:  The heart has a regular rhythm, with no rubs, murmurs or gallops.  ABDOMEN:  The abdomen was soft, with moderate to marked epigastric tenderness to palpation.  The abdomen was moderately protuberant on observation.  No masses or organomegaly was felt.  GENITALIA:  Revealed a normal external female genitalia.  RECTUM:  Revealed no masses.  EXTREMITIES:  The extremities revealed good strength bilaterally with no edema and good pulses bilaterally.  LABORATORY DATA:  CBC on admission revealed a WBC count of 10,200, hemoglobin of 13.1.  The postoperative hemoglobin on January 08, 2000, revealed a hemoglobin of 11.5 grams percent and on January 11, 2000, the hemoglobin was down to a low of 9.5 grams percent but improved on January 17, 2000, to 10.6 grams percent.  The patients blood chemistries have been essentially within normal limits except her serum albumin dropped from an admission level of 3.7 grams percent to 2.8 grams percent on January 17, 2000.  The patient had episodes of severe epigastric pain and pancreatic enzymes were obtained on January 17, 2000, with her serum amylase being  normal at 111 units per liter  with an upper limit of normal of 131, and a serum lipase of 29 with an upper limit of normal of 51.  The patients chest x-ray revealed no active disease on January 04, 2000. The flat plate upright of the abdomen revealed no evidence of bowel obstruction and surgical clips were present in the right pelvis.  HOSPITAL COURSE:  The patient received a general surgery consult on January 06, 2000, for intractable peptic ulcer disease, epigastric pain with gastric outlet obstruction, by Danny Lawless, M.D.  Dr. Horald Pollen had seen the patient previously and was familiar with her case.  She received a surgical operation on January 07, 2000, receiving a hemigastrectomy and vagotomy and Billroth I anastomosis.  The patients general postoperative condition remained stable with no major events except she was unable to be weaned off her IV medications for pain control and Phenergan for nausea and vomiting control.  The patient was kept on IV protonix as well as IV Reglan and we still have been unable to taper her off of these IV medications for pain control and control of nausea and vomiting.  The GI service that had followed her was consulted and Roosvelt Harps, M.D., saw her in consultation on a somewhat  emergency basis and did a esophagogastroduodenoscopy on January 15, 2000, and found her to have a paries anastomotic stoma ulcer.  It was recommended that she be continued on the IV protonix and IV Reglan until her symptoms were controlled by the primary treatment for her peptic ulcer disease, however, this has not been successful at this point.  The patient received the gastroesophagoduodenoscopy procedure on January 15, 2000.  Because of the patients persistent symptoms and lack of progress, I consulted the gastroenterology service of [facility] for peptic ulcer disease and motility disorder, and was able to speak with Dr. Midge Aver, for further acceptance of  this patient at [facility] service for further care.  He was helpful in accepting this patient since the gastroenterology service at [facility] [place] had no further treatment for this patient and she would not have any follow up gastrointestinal specialty support except for emergencies. This state of affairs has been explained to the patient and this is officially being documented so that she can receive her follow up gastrointestinal specialty support at [facility], [place], [place].  Dr. Link Snuffer also asked me to inform the patient that she would need to discontinue her pain medications and use non-narcotic medications in the future and this has been also explained to the patient that this will be the plan in modifying her medication program at [facility], [place], [place]. She will probably need some psychiatric support in that she has received psychiatric support in the past in [place], at the [facility] Department.  She is presently on remeron 15 mg q.h.s. to help her with anxiety and depression disorder.  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  MEDICATIONS:  1. The patients IV medications have been adjusted at this point to Reglan 15     mg IV a.c. and h.s.,  2. Phenergan 25 mg q.4h. p.r.n. severe nausea and vomiting  3. Dilaudid 0.5 mg IV q.4h. p.r.n. severe pain.  4. She does take also Ativan 1 mg p.o. on a q.6h. basis for anxiety.  5. Protonix is 40 mg p.o. b.i.d..  6. She also takes potassium chloride as K-Dur 20 mEq p.o. t.i.d. for     potassium replacement.  7. She is also on Pepcid 20 mg p.o. b.i.d. for any additional acid     suppression she gets from this medication at this time.  8. She is on albuterol 2 puffs q.i.d. for chest tightness,  9. Flovent 110 micrograms  metered dose inhaler q.a.m. and q.h.s. 10. Serevent 2 puffs q.a.m. and q.h.s. 11. Tussionex suspension q.12h. p.r.n. cough. 12. The patient has available Tylenol suppositories 650 mg q.4h p.r.n. for     mild pain with nausea, however, she almost routinely refuses this     medication for pain.  The patient is scheduled to be discharged and transferred to [facility] by ambulance on January 24, 2000, when bed is available.  The patient will be transferred to Midge Aver, M.D. Service at [facility], [place], [place]. DD:  01/23/00 TD:  01/23/00 Job: [id number]

## 2010-05-22 NOTE — H&P (Signed)
[name], [name]                           ACCOUNT NO.:  [id number]   MEDICAL RECORD NO.:  [id number]                    PATIENT TYPE:   LOCATION:                                       FACILITY:   PHYSICIAN:  Georgann Housekeeper, M.D.                 DATE OF BIRTH:   DATE OF ADMISSION:  DATE OF DISCHARGE:                                HISTORY & PHYSICAL   DATE OF BIRTH:  04-May-1950   CHIEF COMPLAINT:  Seizure and abdominal pain.   HISTORY OF PRESENT ILLNESS:  The patient is a 60 year old female with  history of multiple admissions to the [facility] and emergency room  for alcohol intoxications, depression, pancreatitis. The patient was brought  in by her husband who called 911 this afternoon. The husband found her. She  had a witnessed seizure for about five minutes. By the time she came to the  emergency room she was awake and alert. Husband says that this afternoon she  was picking up some things in the chair and then had a seizure with tongue  biting. According to the husband, she was in the emergency room for two to  three visits within the last two weeks, initially for abdominal pain and  some seizure. She was also having nausea, vomiting, and diarrhea for two to  three days with a little hot and chills and no headaches or dizziness. The  patient had not been drinking for the last two days. She has been a heavy  alcohol abuser. On August 19, 2001 in the emergency room labs were all  unremarkable. The patient reported that she was having some abdominal pains  and took some Ibuprofen. She reports only taking two or three. She had no  blood in the stool or vomitus. She is weak and at the time of my examination  she was awake and able to give the history and the husband was present.   PAST MEDICAL HISTORY:  Multiple admissions to the hospital for recurrent  pancreatitis with last admission in June of 2003, polysubstance abuse, pain  medication abuse, history of  seizures reported. Never been on any  medications for over a year. History of alcohol abuse. History of peptic  ulcer disease and history of depression.   MEDICATIONS:  She was suppose to be on Ativan and some Vicodin, which she  does not have any of.   ALLERGIES:  THORAZINE.   PAST SURGICAL HISTORY:  Status post gastric surgery for peptic ulcer  disease. She had endoscopy and colonoscopy in 2001.   SOCIAL HISTORY:  Alcohol abuse. Drinks a whole lot. She could not quantify  it. Had not been able to drink for two days because of nausea and vomiting.  Tobacco abuse of two to three packs per day. No IV drugs or cocaine. Married  with two children. 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   FAMILY HISTORY:  Father and brother both have alcohol problems.   PHYSICAL EXAMINATION:  VITAL SIGNS: Temperature 99.4, blood pressure 103/43,  pulse 76, respiratory rate 20.  GENERAL: Awake and alert. Cachectic looking female. Complaining of some  abdominal pain.  HEENT: Pupils are equal, round, and reactive to light and accommodation.  Extraocular muscles intact.  NECK: Supple. No jugular venous distention.  CARDIAC: Regular rate and rhythm.  No murmur, rub, or gallop.  LUNGS: Clear.  ABDOMEN: Soft with scar form previous surgery. Tender and nondistended.  Positive bowel sounds. No rebound or guarding.  EXTREMITIES: Within normal limits.  NEURO: Nonfocal. Was alert and oriented.  RECTAL: Guaiac negative.   LABORATORY DATA:  Labs were all drawn but at the time of examination, was  not back.   DIAGNOSTIC STUDIES:  Abdominal series was ordered.   ASSESSMENT:  A 60 year old female with multiple admission. Now coming in  with the following.  1. Seizure disorder. This is probably related to ETOH withdrawal.  2. Abdominal pain. Probably from withdrawal, rule out pancreatitis. No     evidence of any acute GI bleed.  3. Polysubstance abuse. Withdrawal from the codeine. She has been on Vicodin      and OxyContin.   PLAN:  IV fluids. Follow-up on the lab studies. I am holding the anti-  seizure medication at this point. IV Protonix. She will probably benefit  from a psych consultation.                                               Georgann Housekeeper, M.D.    [name]  D:  08/26/2001  T:  08/26/2001  Job:  [id number]

## 2010-05-22 NOTE — H&P (Signed)
[name], [name] NO.:  [id number]   MEDICAL RECORD NO.:  [id number]          PATIENT TYPE:  IPS   LOCATION:  0307                          FACILITY:  [facility]   PHYSICIAN:  Jeanice Lim, M.D. DATE OF BIRTH:  [birth year]   DATE OF ADMISSION:  03/12/2005  DATE OF DISCHARGE:                         PSYCHIATRIC ADMISSION ASSESSMENT   IDENTIFYING INFORMATION:  A 60 year old married white female, voluntarily  admitted on March 12, 2005.   HISTORY OF PRESENT ILLNESS:  The patient presents with a history of opiate  abuse, has been using pain pills for the past 30 years, has been going to  the emergency room for problems with pain and has been getting small  increments of pain pills.  Also reports a history of alcohol abuse, has been  sober for __________ years and relapsed on Monday, drinking 2 beers.  Her  last pain pill use was on Thursday prior to this admission.  She also  received some Dilaudid.  The patient has lost 60 pounds over one year.  She  has not been sleeping, reporting sleeping only about 1 hour at a time.  She  feels that she needs to get off her pain medicines but is worried that she  may not do so.  She denies any depressive or suicidal thoughts.   PAST PSYCHIATRIC HISTORY:  The patient has been hospitalized prior at  [facility].  Last admission was in 2003.  No history of any  suicide attempts, no current outpatient treatment.   SOCIAL HISTORY:  She is a 60 year old married white female, married for 12  years, 2 children, both adults.  Husband is on disability.   FAMILY HISTORY:  None.   ALCOHOL DRUG HISTORY:  The patient smokes, reports some recent alcohol abuse  and has a history of alcohol dependence in the past.  The patient reports  seizures activity, drinking in the morning, experiencing blackouts.  Denies  any other drug use.   PAST MEDICAL HISTORY:  Primary care [name] is Dr. Ronne Binning in [place].  Medical problems  are pancreatitis, hypertension.   MEDICATIONS:  Has been on Vicodin and Dilaudid.  She reports getting  prescriptions for pain pills from Dr. Ronne Binning.   DRUG ALLERGIES:  THORAZINE, reporting problems with dystonia.   PHYSICAL EXAMINATION:  The patient was assessed at [facility].  This is a  thin, unkempt female who appears older than her calendar years.  Review of  systems:  The patient reports problems with fever, denies any chills.  Weight loss.  Reports positive nausea and vomiting, no dysuria, positive for  substance abuse, positive for seizures, no falls.  Temperature is 100.2,  pulse is 95, respiratory rate is 16, blood pressure is 141/98.  The patient  is 184 pounds with a height of 5 feet 5 inches tall.  The patient in the  emergency room received some Clonidine 0.2, received fluids, Pepcid and  Ativan.   LABORATORY DATA:  CMET is within normal limits.  Alcohol level less than 5.  WBC count is elevated at 12.1.  Hemoglobin 15.6.  :Urinalysis showed 3-6  WBCs.   MENTAL STATUS EXAM:  She is a fully alert, cooperative female, fair eye  contact, unkempt, holding a rag to face as if she is going to vomit.  Her  speech is clear, normal rate and tone.  The patient is hurting and feeling  anxious.  The patient looks like she is in some physical distress.  Her  affect is flat, thought processes are coherent, there is no evidence of  psychosis, cognitive function intact.  Memory is fair, judgment and insight  are fair.   ADMISSION DIAGNOSES:  AXIS I:  Opiate dependence, alcohol abuse, recent  relapse, rule out substance-induced mood disorder.  AXIS II:  Deferred.  AXIS III:  Hepatitis C, chronic obstructive pulmonary disease, and chronic  pancreatitis.  AXIS IV:  Other psychosocial problems, medical problems.  AXIS V:  Current is 35.   PLAN:  Plan is to stabilize mood and thinking, contract for safety.  We will  detox from opiates, have other medications Seroquel and Neurontin  available  for the patient for pain and agitation.  We will also do a nutrition  consult.  Case manager to look at any potential rehab programs available,  contact husband for any concerns.  The patient is to continue to follow up  for her medical problems with Dr. Ronne Binning.   TENTATIVE LENGTH OF CARE:  4-5 days.      Landry Corporal, N.P.      Jeanice Lim, M.D.  Electronically Signed    JO/MEDQ  D:  03/16/2005  T:  03/16/2005  Job:  [id number]

## 2010-05-22 NOTE — Discharge Summary (Signed)
[name], [name]               ACCOUNT NO.:  [id number]   MEDICAL RECORD NO.:  [id number]          PATIENT TYPE:  INP   LOCATION:  1417                         FACILITY:  [facility]   PHYSICIAN:  Janice Paling, MD    DATE OF BIRTH:  11-25-1950   DATE OF ADMISSION:  06/26/2008  DATE OF DISCHARGE:  06/28/2008                               DISCHARGE SUMMARY   PRIMARY CARE PHYSICIAN:  [name], M.D.   DISCHARGE DIAGNOSES:  1. Acute asthma exacerbation.  2. Alcohol abuse.   SECONDARY DIAGNOSES:  1. History of anxiety disorder.  2. History of chronic obstructive pulmonary disease.  3. History of liver cirrhosis.  4. History of gastroesophageal reflux disease.  5. History of chronic pancreatitis.  6. History of polysubstance abuse.   DISCHARGE MEDICATIONS:  1. New medication added:  Prednisone 40 mg p.o. daily for 3 days, 30      mg p.o. daily for 3 days, then 20 mg daily for 3 days, then 10 mg      p.o. daily for 3 days, then 5 mg p.o. daily for 3 days.  2. The patient is to continue home medication regimen, Advair 250/50      mcg 1 puff twice a day.  3  Albuterol 2 puffs MDI daily.  1. Dilaudid 4 mg p.o. q.8 h. p.r.n.  2. Zofran 8 mg p.o. q.6 h. p.r.n.  3. Ativan 1 mg p.o. daily p.r.n.  4. Nexium 40 mg p.o. daily.   HOSPITAL COURSE:  The following issues were addressed during the  hospitalization.  1. COPD exacerbation.  The patient was admitted to the hospital and      steroids were given.  At the time of discharge the patient was      breathing comfortably and her respiratory status was stable.  2. Tobacco abuse.  The patient apparently went against advice to smoke      cigarettes and wanted to go AMA from there.  She finally persuaded      to stop smoking cigarettes.  She also received a nicotine patch      during the hospitalization.  She was counseled extensively against      the use of tobacco.  3. Alcohol abuse.  The patient received p.r.n. Ativan and she  did not      have any evidence of Dts.   IMAGING/CONSULTATIONS/PROCEDURES PERFORMED:  Imaging performed.  Chest x-  ray on June 23 showing hyperaeration and no acute cardiopulmonary  process.   DISPOSITION:  The patient will follow up with Dr. Ronne Binning in one week's  time  Total time spent in discharge of this patient one hour.      Janice Paling, MD  Electronically Signed     NP/MEDQ  D:  07/09/2008  T:  07/09/2008  Job:  [id number]   cc:   [name], M.D.  Fax: [phone]

## 2010-05-22 NOTE — H&P (Signed)
NAME:  [name], BRUCK NO.:  [id number]   MEDICAL RECORD NO.:  [id number]                   PATIENT TYPE:  INP   LOCATION:  0108                                 FACILITY:  [facility]   PHYSICIAN:  Lorelle Formosa, M.D.           DATE OF BIRTH:  1951/01/03   DATE OF ADMISSION:  01/10/2002  DATE OF DISCHARGE:                                HISTORY & PHYSICAL   HISTORY OF PRESENT ILLNESS:  The patient is a 60 year old female who  presented the emergency room with complaints of vomiting 7 times a day for  the last 3 days. She states that she has been back and fourth to the  emergency room and that she feels weak. She was seen in the emergency room  and noted that her electrolytes were basically normal except a low  potassium, but she is being admitted because of weakness.   PAST MEDICAL HISTORY:  1. Significant for numerous hospitalizations, ER visits for nausea and     vomiting.  2. Pancreatitis.  3. ETOH abuse.  4. Ulcer surgery because of peptic ulcer disease.  5. Benign breast tumor on the left breast.  6. Depression.  7. Seizure disorder.   ALLERGIES:  THORAZINE.   MEDICATIONS:  1. Protonix 40.  2. Phenergan.  3. Vicodin.   FAMILY HISTORY:  Positive for ETOH abuse in her father, brother and the  patient.   REVIEW OF SYSTEMS:  GENERAL:  Weight loss. ENT:  Negative. GI: As noted.  PULMONARY:  Some shortness of breath at times. GU:  Negative.   SOCIAL HISTORY:  She is a heavy smoker. She states that she has had no ETOH  for the last month. She is married, has 2 children. She is unemployed.   PHYSICAL EXAMINATION:  GENERAL:  A cachectic looking female.  VITAL SIGNS:  Temperature 98.2, blood pressure 145/73, pulse 107,  respirations 24, O2 saturation 97%.  HEENT:  Head is normocephalic with temporal wasting noted.  NECK:  Supple.  CHEST:  Showed wheezing diffusely expiratory.  COR:  Regular, no S3, no S4. No thrills or heaves are  present.  ABDOMEN:  Was tender to palpation. Positive bowel sounds. Negative for  masses, negative for organomegaly.  EXTREMITIES:  Negative. Pulses were good. She is able to get up and walk.  RECTAL:  Negative as per Ivery Quale, P.A.   LABORATORY DATA:  WBC 7.7, hemoglobin 9.3, hematocrit 28.7, MCV __________,  MCHC 32.2, platelets 261. Lipase 24. Sodium 139, potassium 3.1, chloride  108, CO2 22, BUN 60, creatinine 0.7. Total protein 6.3, albumin 3.7, SGOT  70, SGPT 11, alkaline phosphatase 62.   ASSESSMENT:  A 60 year old female with a long history of abdominal pain  initially based on her ETOH abuse, but the patient reports no ETOH abuse at  this time. She comes in today with nausea and vomiting.  1. Nausea and vomiting.  2. Minimal dehydration.  3. Hypokalemia.  4. Anemia.  5. Status post gastric resection for peptic ulcer disease.  6. Depression.  7. Weight loss.  8. Seizure disorder.  9. Chronic obstructive pulmonary disease.  10.      History of ETOH abuse and alcohol abuse.   PLAN:  The plan was to admit the patient to the hospital, hydrate and  monitor carefully.     Renaye Rakers, M.D.                         Lorelle Formosa, M.D.    VB/MEDQ  D:  01/10/2002  T:  01/11/2002  Job:  [id number]

## 2010-05-22 NOTE — H&P (Signed)
[name], DILLAVOU NO.:  [id number]   MEDICAL RECORD NO.:  [id number]          PATIENT TYPE:  EMS   LOCATION:  ED                           FACILITY:  [facility]   PHYSICIAN:  Marcellus Scott, MD     DATE OF BIRTH:  1950/07/12   DATE OF ADMISSION:  09/04/2005  DATE OF DISCHARGE:                                HISTORY & PHYSICAL   Primary Care Physician: Billee Cashing   CHIEF COMPLAINT:  Episode of seizure today.   HISTORY OF PRESENT ILLNESS:  [name] is a 60 year old Caucasian female  patient with history of chronic abdominal pain secondary to chronic  pancreatitis, thought to be alcohol related and failure to thrive secondary  to the same. Status post partial gastrectomy for peptic ulcer disease, COPD,  and anemia. She now presents with an episode of seizure at about noon today.  Apparently the patient was in the kitchen when she had an aura of a funny  taste in the mouth followed by jerking movements of all of her limbs and her  head. However, the patient claims that she did not lose consciousness  throughout the episode. There was no history of any tongue biting, frothing  of the mouth, or urinary or fecal incontinence. The patient claims that she  was about to fall but was held by her spouse and following the episode, that  lasted about a minute, following which it aborted spontaneously and the  patient had a mild headache post episode but denies any history of feeling  weak. The patient was brought to the emergency room for evaluation and  management. The patient has had episodes of seizures in the past. The last  episode being 9 months ago. She agrees that many of her episodes have had a  temporal relationship with her alcohol abuse or not using alcohol for a  couple of days. She has not been on any chronic or maintenance therapy for  her seizures. The patient does not definitely remember the last time she  drank alcohol. She says that is could have  been a couple of days ago or 2  weeks ago. The patient, even now in the emergency room, feels like she is  jittery and shaky, suggesting a milder form of the seizure episode that  happened at home.   PAST MEDICAL HISTORY:  1. Chronic pancreatitis, though to be alcohol abuse related.  2. History of status post partial gastrectomy for peptic ulcer disease.  3. Chronic obstructive pulmonary disease.  4. Anemia.   ALLERGIES:  THORAZINE.   MEDICATIONS:  1. Ativan 1 mg three times a day.  2. Nexium.  3. Dilaudid. She says she has 4 mg tablets and she takes 3 to 4 tablets      daily.  4. Zofran 4 mg p.o. p.r.n.   FAMILY HISTORY:  Noncontributory.   SOCIAL HISTORY:  The patient lives with her spouse. She is disabled. She is  able to walk, however, unsteadily at home. She has had occasional falls but  none recently. She has a 35 pack year smoking history and continues  to  smoke. She is a chronic alcohol abuser and claims that she drinks 5 to 6  beers every other day. As indicated earlier, she cannot remember the last  time she had alcohol.   ADVANCED DIRECTIVES:  FULL CODE.   REVIEW OF SYSTEMS:  HEENT:  intermittent headaches and this episode of  seizure. The patient denies diplopia, blurred vision. ENT:  There is no sore  throat or earache and no difficulty in swallowing.  RESPIRATORY:  She does  not have any cough or difficulty in breathing or wheezing. CARDIOVASCULAR:  Denies any chest pain or palpitations. GASTROINTESTINAL:  Apart from the  history of presenting complaints, the patient denies any nausea, vomiting,  diarrhea, or constipation. She has lost a significant amount of weight. She  was 130 pounds 2 years ago and now, she weighs 78 pounds. She also has this  chronic periumbilical or epigastric abdominal pain, 8 to 10 at its worst, on  a daily basis, which is relieved to 3 to 4 over 10 with her pain  medications. She also has decreased appetite and feels generally weak.   GENITOURINARY:  There is no polyuria, polydipsia, dysuria, or urgency.  MUSCULOSKELETAL:  She complains of arthralgia in all joints. SKIN:  There is  no rashes. PSYCHIATRIC:  There are no delusions or hallucinations. CNS:  She  denies any history of asymmetric weakness. She is just generally weak and  when she stands up, she has difficulty walking because of the weakness.   PHYSICAL EXAMINATION:  VITAL SIGNS:  Temperature 98.7, pulse 111 per minute,  respiratory rate 20, blood pressure of 117/58. These were her admitting  vital signs. O2 saturation of 95% on room air.  GENERAL:  The patient is a 60 year old female who looks all skin and bones,  extremely malnourished, but not in any cardiopulmonary or painful distress  at this time.  HEENT:  Normocephalic and atraumatic. She has bilateral cataracts. Pupils  are equal, round, and reactive to light. Mucosa pink and mildly dry. There  is no icterus. There is no pharyngeal erythema. Her tongue looks smooth. The  patient does not have any nystagmus.  NECK:  No JVD, carotid bruit, thyromegaly, or lymph nodes.  RESPIRATORY:  There are good breath sounds bilaterally, however, there are a  few rhonchi heard bilaterally but no wheezing and no crackles.  BREAST:  Examination done in the presence of the nurse and was negative for  any masses, lumps, or skin changes. She has no axillary or lymphadenopathy  anywhere else.  CARDIOVASCULAR:  First and second heart sounds heard. Regular rate and  rhythm. There is no third or fourth heart sounds, rubs, murmurs, or gallops.  ABDOMEN:  Scaphoid. She has a laparotomy scar. She is minimally tender in  her epigastric region but there is no guarding, rigidity, or rebound. There  is no organomegaly or mass palpated. Bowel sounds are heard normally.  EXTREMITIES:  There is no clubbing, cyanosis. Pedal pulses are symmetrically  felt at 2+ and she has no pedal edema. NEUROLOGIC:  Awake, alert and oriented times  three with no focal  neurological deficit. Gait examination, when the patient was walking to be  weighed, looked steady. However, she needs support because of her  generalized weakness. There are no cerebellar signs elicited.   LABORATORY DATA:  Routine hematology:  Hemoglobin and hematocrit of 15.4 and  45.4 with an MCV of 102.8. White blood cell count 11.5. Platelet count  214,000. Neurophil's 74%. Her routine chemistry:  Sodium 131, potassium 3.2,  chloride 96, bicarbonate 25, glucose 112, BUN 14, creatinine 0.7. Her  hepatic panel shows a total protein of 6.5, albumin 3.8, total bilirubin  1.1, AST 19, ALT 18, alkaline phosphatase 72 and her calcium is 9.2. Lipase  of 48. Urinalysis had a small amount of bilirubin, otherwise, negative for  glucose, ketones, blood, nitrite, or leukocyte esterase. She has 100 mg per  deciliter of protein in the urine.   ASSESSMENT/PLAN:  1. This is a 60 year old female patient with a history of chronic      abdominal pain secondary to chronic pancreatitis thought to be alcohol      related, Failure to thrive secondary to the same, Status post partial      gastrectomy for peptic ulcer disease, Chronic obstructive pulmonary      disease, anemia, now with 1 seizure which may be related to alcohol      withdrawal. Will followup her blood alcohol levels and urine toxicology      screen. Will obtain a CT head, non-contrast and an EEG. Will give her      Ativan intravenously p.r.n. Will hold maintenance anti-epileptics      because of the temporal relationship of many of her seizures to her      alcohol use or not use for a few days. I have discussed in detail and      have counseled the patient regarding discontinuing the alcohol use      totally due to her history of seizure, pancreatitis, and peptic ulcer      disease. She verbalizes understanding and says she will try to stop.      Will also place the patient on fall precautions and monitor patient for       delirium tremors. We will follow the magnesium levels.  2. Chronic abdominal pain secondary to chronic pancreatitis. Will place      the patient on analgesics p.r.n. and a proton pump inhibitor.  3. Chronic obstructive pulmonary disease -  will place the patient on      p.r.n. bronchodilator nebulizers.  4. Macrocytic anemia - which is likely alcohol abuse related, will      followup her B12, folate, and TSH levels.  5. Failure to thrive - thought to be due to her chronic pancreatitis,      chronic abdominal pain, and alcohol abuse. Management per above.  6. Electrolyte abnormalities - hyponatremia, which is probably nutritional      and poor intact, will hydrate her intravenously with normal saline and      follow her basic metabolic panel. For her hypokalemia, will supplement      her potassium and check her basic metabolic panel and will also follow      her magnesium levels. 7. The patient is difficult stick and we are unable to get a peripheral IV      access, so may have to get a PICC line placement.   [facility]      Marcellus Scott, MD  Electronically Signed     AH/MEDQ  D:  09/04/2005  T:  09/04/2005  Job:  [id number]   cc:   Lorelle Formosa, M.D.  Fax: [phone]

## 2010-05-22 NOTE — H&P (Signed)
[name], [name]               ACCOUNT NO.:  [id number]   MEDICAL RECORD NO.:  [id number]          PATIENT TYPE:  INP   LOCATION:  1307                         FACILITY:  [facility]   PHYSICIAN:  Lonia Blood, M.D.      DATE OF BIRTH:  19-Oct-1950   DATE OF ADMISSION:  05/02/2006  DATE OF DISCHARGE:                              HISTORY & PHYSICAL   PRIMARY CARE PHYSICIAN:  Lorelle Formosa, M.D.   CHIEF COMPLAINT:  Abdominal pain, nausea and vomiting.   HISTORY OF PRESENT ILLNESS:  The patient is a 60 year old female with  multiple visits to the emergency room over the past couple of weeks  complaining of persistent abdominal pain, nausea, vomiting.  She  apparently has history of chronic pancreatitis.  She has been on  multiple medications as an outpatient.  The patient is here now  complaining of abdominal pain.  She has been unable to keep anything  down apparently x4 days.  She has been to the emergency room for so many  visits.  She is on multiple medications.  However, he seems not to be  getting better.  Hence, she is here today.   PAST MEDICAL HISTORY:  1. Chronic pancreatitis.  2. Chronic pain syndrome, mainly narcotics with chronic use.  3. Tobacco abuse.  4. Alcohol abuse.  5. COPD.  6. History of GI bleed.  7. History of gastroparesis.  8. Hypertension.   ALLERGIES:  THORAZINE.   MEDICATIONS:  1. Advair at 1-2 mg p.o. nightly.  2. Dilaudid 4 mg p.o. four times a day.  3. Prevacid 40 mg p.o. daily.   SOCIAL HISTORY:  The patient lives in [place] alone.  She smokes  about two packs per day.  Denied any alcohol use.   FAMILY HISTORY:  Denied any family history of substance abuse.  No GI  malignancy.   REVIEW OF SYSTEMS:  Review of systems is negative except per HPI.   PHYSICAL EXAMINATION:  VITAL SIGNS:  Temperature 97.2, blood pressure  134/55, pulse 113, respiratory rate 20, saturations 96% room air.  GENERAL:  The patient is chronically ill-looking  and looks agitated in  mild distress.  HEENT:  PERRL.  EOMI.  NECK:  Neck is supple.  No JVD, no lymphadenopathy.  RESPIRATORY:  Good air entry bilaterally.  No wheezes or rales.  CARDIOVASCULAR:  S1, S2 tachycardiac.  ABDOMEN:  Soft, scaphoid.  Mild diffuse tenderness with positive bowel  sounds.  EXTREMITIES:  No edema, cyanosis or clubbing.   LABORATORY DATA AND X-RAY FINDINGS:  White count of 14.3, hemoglobin  16.3, platelet count 261 with ANC of 12.9.  Alcohol level 59.  Sodium  134, potassium 3.4, chloride 100, CO2 21, glucose 96, BUN 17, creatinine  0.96.  LFTs essentially normal.   Abdominal x-rays showed mainly nonobstructive bowel gas pattern.  Chest  x-ray with no acute abnormalities only COPD.  CT of abdomen and pelvis  is currently pending.   ASSESSMENT:  This is a 60 year old female with known history of chronic  pancreatitis presenting with acute on chronic pancreatitis and elevated  alcohol  level.  The patient denied drinking, although alcohol level is  elevated indicating that she must have drank alcohol which may have been  the trigger.  Also, she has some mild hypokalemia and chronic pain  syndrome.   PLAN:  1. Acute pancreatitis, acute on chronic.  Will admit the patient,      n.p.o. and then gradually clear liquids and advance her diet as she      improves.  Her lipase is 85 today.  Will repeat the lipase in the      morning.  Pain control and fluids.  2. Tobacco abuse.  Nicotine patch for the patient and also get tobacco      cessation counseling.  3. Alcohol abuse.  I will put her on thiamine and folate.  Will also      use Ativan p.r.n. in case she withdrawals.  4. Chronic pain syndrome.  This may be the driving force behind the      patient's presentation.  Will keep her on some pain medicine as      necessary.  5. Chronic obstructive pulmonary disease.  The patient has moderate      COPD exacerbation, but I will keep her on some nebulizers as       necessary.  6. Hypertension.  Blood pressure seems to be fine.  We will not change      any medications.  We will continue with whatever she was on at      home.      Lonia Blood, M.D.  Electronically Signed     LG/MEDQ  D:  05/03/2006  T:  05/03/2006  Job:  [id number]

## 2010-05-22 NOTE — Discharge Summary (Signed)
[name], [name]               ACCOUNT NO.:  [id number]   MEDICAL RECORD NO.:  [id number]          PATIENT TYPE:  INP   LOCATION:  1307                         FACILITY:  [facility]   PHYSICIAN:  [name], M.D.    DATE OF BIRTH:  05/19/1950   DATE OF ADMISSION:  07/19/2005  DATE OF DISCHARGE:  07/25/2005                                 DISCHARGE SUMMARY   DISCHARGE DIAGNOSES:  1.  Acute on chronic pancreatitis.  2.  Peptic ulcer disease, status post partial gastrectomy in the past.  3.  Chronic obstructive pulmonary disease with ongoing tobacco abuse.  4.  Normocytic anemia.  5.  History of alcohol abuse.   For additional diagnoses, please see the dictated History and Physical.   DISCHARGE MEDICATIONS:  1.  Protonix 40 mg daily.  2.  Dilaudid 2 mg 1-2 tablets every 4 hours as needed for pain (35 pills      dispensed per prescription).  3.  Creon 10 two pills with breakfast, lunch and dinner.  4.  Zofran 4 mg every 6 hours as needed.  5.  Ativan 1 mg 3 times a day.  6.  Combivent inhaler 2 puffs b.i.d. as needed.  7.  Maalox 10 mL every 6 hours as needed or per the directions on the label.  8.  Multivitamin with iron once daily.   DISCHARGE DISPOSITION:  The patient was discharged home in improved and  stable condition on July22,2007.  She was advised to follow up with her  primary care physician, Dr. Ronne Binning, in 1 week.   HISTORY OF PRESENT ILLNESS:  The patient is a 60 year old lady with a past  medical history significant for chronic pancreatitis, history of alcohol  abuse, and partial gastrectomy secondary to peptic ulcer disease, who  presented to the emergency department on July16,2007, with a Chief Complaint  of epigastric and left upper quadrant abdominal pain.  She did have  associated nausea and vomiting.  She admitted to drinking alcohol  approximately 1 week ago but none within the past 2-3 days.  When the  patient was evaluated in the emergency department,  she was hemodynamically  stable and afebrile.  Her lab data were significant for a lipase of 330.  She was, therefore, admitted for further evaluation and management.   HOSPITAL COURSE:  #1.  ACUTE PANCREATITIS:  The patient was started on a Duragesic patch 25  mcg every 3 days and as-needed intravenous Dilaudid for pain management.  Protonix intravenously was started for peptic ulcer disease prophylaxis.  Zofran and Phenergan were ordered for nausea as needed.  The patient was  started on volume repletion with D5 half-normal saline at 150 mL an hour.  An amp of multivitamins was added to the IV fluids daily.  The patient was  kept n.p.o..  On admission, her lipase was elevated at 330.  Given her  history of alcohol abuse, an alcohol level was assessed and found to be less  than 5.  An urine drug screen was ordered as well and was positive for  benzodiazepines and opiates. (The patient takes Ativan  and Vicodin or  Dilaudid chronically.)   Subsequently, the patient required  Dilaudid PCA  for better pain relief.  After approximately 3 days, the patient's pain subsided, and she wanted to  eat.  The diet was advanced to clear liquids and then full liquids.  She  tolerated the advancement without any increase in abdominal pain, nausea or  vomiting. Subsequently, the diet was advanced to a low-fat diet yesterday  and, with the exception of one episode of nausea, she has tolerated it well.  The PCA Dilaudid has been discontinued.  The patient is currently stable for  hospital discharge.  Her lipase is now 41 and has been within normal limits  over the past 3 days.  Her white blood cell count is now 8.2; it was 12 on  admission.  There were no signs or symptoms consistent with infection during  hospital course.  The patient will be discharged on oral Dilaudid as needed  for pain, Zofran as needed for nausea, and Pancrease for treatment of  chronic pancreatitis.  The patient was advised to take  one multivitamin with  iron daily.  She was strongly advised to discontinue drinking permanently.   #2.  PEPTIC ULCER DISEASE:  The the patient was restarted on Protonix during  the hospital course as indicated above.  She will be discharged to home on  the same.  The patient was also advised to take as-needed Maalox for  indigestion.   #3.  TOBACCO ABUSE:  The patient was counseled on tobacco cessation.  She  was started on a nicotine patch and Combivent inhaler 2 puffs b.i.d.Marland Kitchen  During hospital course, she had no complaints of a cough or shortness of  breath.  She was generally oxygenating between 95-98% on room air.   #4.  MILD NORMOCYTIC ANEMIA:  The patient's hemoglobin ranged from 13.1-10.5  during hospitalization.  The decrease in the hemoglobin, in part, was  secondary to hemodilution.  Given the patient's age, she probably needs an  outpatient colonoscopy and endoscopy.  These studies can be arranged by her  primary care physician, Dr. Ronne Binning.  The patient was advised to take a  multivitamin with iron once daily.  There were no acute signs of GI bleeding  during the hospital course.      [name], M.D.  Electronically Signed     DF/MEDQ  D:  07/25/2005  T:  07/25/2005  Job:  [id number]   cc:   Lorelle Formosa, M.D.  Fax: [phone]
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
## 2010-05-22 NOTE — Consult Note (Signed)
[place]  Patient:    Janice, [name]                      MRN: [id number] Proc. Date: 12/02/99 Adm. Date:  [id number] Attending:  Eino Farber CC:         Eino Farber., M.D.                          Consultation Report  DATE OF BIRTH:  1950/12/30  REQUESTING PHYSICIAN:  Eino Farber., M.D.  INTRODUCTORY PARAGRAPH:  [name] is a 59 year old white female referred by Dr. Petra Kuba for a dental consultation.  Patient admitted with epigastric pain associated with known recurrent acute pancreatitis and duodenal ulcer. Dental consultation was requested for the evaluation of toothache symptoms.  PAST MEDICAL HISTORY: 1. 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    Current proton pump inhibitor therapy. 2. History of recurrent acute pancreatitis with vomiting. 3. COPD with asthma. 4. History of depression. 5. Arthritis. 6. History of anxiety/depressive disorder.  ALLERGIES/ADVERSE DRUG REACTIONS: 1. THORAZINE (CHLORPROMAZINE). 2. ASPIRIN intolerance.  MEDICATIONS:  (Per MAR) 1. Ativan 1 mg IV q.8h. 2. Flovent two puffs twice daily. 3. Serevent two puffs twice daily. 4. Potassium chloride 20 mEq twice daily. 5. Nicotine patch. 6. Remeron 50 mg twice daily. 7. Protonix 40 mg IV q.12h. 8. Tylenol 650 mg q.4h. as needed for pain.  SOCIAL HISTORY:  Patient is married.  Patient has smoked one pack per day x 30+ years.  Patient quit the use of alcohol 10 years ago, by her report.  FAMILY HISTORY:  Noncontributory.  FUNCTIONAL ASSESSMENT:  Patient was independent for all ADLs prior to this admission.  REVIEW OF SYSTEMS:  (This was reviewed from the chart/Health History Assessment form - this admission).  DENTAL HISTORY:  CHIEF COMPLAINT:  Patient complained of toothache to the upper left and  lower right quadrants.  HISTORY OF PRESENT ILLNESS:  Patient complaining of an upper left and lower right toothache.  Pain is described as sharp, which lasts for greater than five minutes at a time.  Patient indicates that the teeth have been hurting off and on for the past two weeks.  Patient does use Tylenol with "some relief."  Patient also indicates that there has been tooth sensitivity/pain to the lower right molar (tooth #30) when it "broke the tooth."  Patient indicates that the tooth hurts in an intermittent fashion as well.  Patient currently sees a private dentist (Dr. Gala Romney) on a yearly basis, but usually goes "only when she needs to."  Dr. Gala Romney reportedly made her a cast partial denture last year which "fits good" by patient report.  DENTAL EXAMINATION:  GENERAL:  Patient is a well-developed, well-nourished white female in no acute distress at this time.  VITAL SIGNS:  Blood pressure 117/62, pulse rate 115, respirations 20, temperature 98.8.  HEAD AND NECK:  Patient has some left submandibular tenderness to palpation. There are no acute TMJ symptoms at this time.  The patient has bilateral angular cheilitis which is noted.  INTRAORAL:  The patient has xerostomia.  The patient has a small palatal torus.  The patient has denture stomatitis and probable papillary hyperplasia of the maxillary gingival tissues underneath the cast partial denture component of the upper partial denture.  PERIODONTAL:  Patient with chronic periodontitis with plaque and calculus accumulations/accretions, generalized gingival recession, generalized tooth mobility, and moderate horizontal and vertical bone loss.  DENTITION:  There are multiple missing teeth #1-5, #8-10, #14-16, #17-19, and #31-32.  There are multiple diastemas.  DENTAL CARIES:  There are rampant dental caries and suboptimal dental restorations, as per dental charting form.  Multiple caries have probable pulpal  exposure.  ENDODONTIC:  There is a history of acute pulpitis symptoms associated with with the upper left and lower right quadrants.  Patient points to teeth #12 and #30 as the offending teeth at this time.  Tooth #20 has hurt her intermittently as well.  There is no obvious periapical pathology, but symptoms are consistent with acute, irreversible pulpitis diagnosis, as per patient report.  CROWN AND BRIDGE:  There is no history of crown or bridge restorations.  PROSTHODONTIC:  Patient with a history of cast partial denture, which was made approximately one year ago.  The denture "fits good" by patient report, but is clinically ill-fitting secondary to the caries at this time.  OCCLUSION:  The patient has a poor occlusive scheme secondary to multiple missing teeth and lack of replacement of the missing teeth with a clinically acceptable dental prosthesis.  RADIOGRAPHIC INTERPRETATION:  (A panoramic x-ray was taken on December 02, 1999 and supplemented with a full series of dental radiographs in the hospital dental clinic on December 03, 1999).  Multiple missing teeth #1-5, #8-10, #14-16, #17-19, and #31-32.  There are multiple diastemas noted.  There is supraeruption and drifting of the unopposed teeth into the edentulous areas.  There is moderate horizontal and vertical bone loss noted.  There are rampant dental caries and defective restorations noted.  There are several dental caries which have probable pulpal exposure.  ASSESSMENT:  1. History of acute, irreversible pulpitis symptoms associated with teeth     #12, #20, and #30 on an intermittent basis.  2. Chronic periodontitis with moderate bone loss and tooth mobility.  3. Generalized gingival recession.  4. Plaque and calculus accumulations/accretions.  5. Rampant dental caries and suboptimal dental restorations.  6. Small palatal torus.  7. Denture stomatitis/papillary hyperplasia affecting the gingival tissues      underneath the existing maxillary partial denture.   8. Clinically ill-fitting maxillary partial denture secondary to the     recurrent caries.  9. Multiple missing teeth. 10. Multiple diastemas. 11. Supraeruption and drifting of the unopposed teeth into the edentulous     areas. 12. Poor occlusive scheme. 13. Xerostomia.  PLAN/RECOMMENDATIONS:  1. I discussed the risks, benefits, complications of various treatment     options with the patient in relationship to her medical and dental     conditions and Medicaid dental coverage.  I discussed multiple extractions     with alveoloplasty, selective extractions, root canal therapy, dental     restorations, periodontal therapy, crown and bridge therapy, implant     therapy, and fabrication of definitive dental prostheses.  Patient is not     interested in any dental treatment at this time.  The patient will follow     up with her private dentist (Dr. Gala Romney).  Patient will recontact     dental medicine if she changes her mind during this admission.  I     reinforced the fact that extraction of the upper teeth and selective     extraction of lower teeth with the eventual fabrication of an upper     complete and lower partial denture  would most probably be her best     treatment option at this time.  Patient is aware that ultimately the     decision is up to her.  2. Prescribed Xylocaine viscous - using 5-10 cc as a swish for oral     stomatitis as needed, by patient request.  Patient is aware of the risk     for aspiration of foods and liquids secondary to numbing of her mouth.  3. Discussion of findings with Dr. Petra Kuba as indicated.  Dr. Petra Kuba     to handle the prescription of pain medication during this admission until     she can be seen by her private dentist.  4. Reconsult if the patient changes her mind and wishes dental treatment at     this time. DD:  12/04/99 TD:  12/04/99 Job: [id number]

## 2010-05-22 NOTE — H&P (Signed)
[name], [name]               ACCOUNT NO.:  [id number]   MEDICAL RECORD NO.:  [id number]          PATIENT TYPE:  INP   LOCATION:  0103                         FACILITY:  [facility]   PHYSICIAN:  Hillery Aldo, M.D.   DATE OF BIRTH:  10-25-1950   DATE OF ADMISSION:  07/19/2005  DATE OF DISCHARGE:                                HISTORY & PHYSICAL   PRIMARY CARE PHYSICIAN:  Lorelle Formosa, M.D.   GASTROENTEROLOGIST:  Dr. Alto Denver.   CHIEF COMPLAINT:  Abdominal pain.   HISTORY OF PRESENT ILLNESS:  The patient is a 60 year old female with past  medical history of recurrent pancreatitis in the setting of alcohol abuse  who has had multiple visits to the emergency department for complaints of  abdominal pain over the past year.  She now presents with a four-day history  of decreased appetite and progressive increasing abdominal pain mainly in  the left upper quadrant.  The pain is unrelieved by use of pain medications.  She has also had nausea and vomiting and admits to some hematemesis.  Pain  has a burning quality and is rated 10/10.  Her usual pancreatitis pain is  midepigastric.  She is being admitted for further evaluation and workup.   PAST MEDICAL HISTORY:  1.  Peptic ulcer disease, status post partial gastrectomy.  2.  Status post hysterectomy.  3.  Chronic pancreatitis.  4.  COPD slash asthma.  5.  Gastroparesis.  6.  History of alcohol abuse.  7.  History of iron deficiency anemia.   FAMILY HISTORY:  The patient's father died of pancreatitis age 37.  Mother  is alive and suffers with coronary disease.  She has two brothers who are  healthy.  She has two healthy offspring.   SOCIAL HISTORY:  The patient is married and lives with her husband.  She is  disabled.  She admits to one to two packs of tobacco daily.  She denies any  alcohol intake and says she did slip approximately one weeks ago and drank  one beer.  She denies any street drugs.   ALLERGIES:  THORAZINE  CAUSES A DYSTONIC REACTION.   MEDICATIONS:  1.  Ativan 1 mg t.i.d.  2.  Nexium 40 mg daily.  3.  Vicodin 5/500 q. 4 h. p.r.n.   REVIEW OF SYSTEMS:  The patient has had some subjective fever.  She has had  decreased appetite and weight loss.  She had suffers with chronic  constipation and reports black stool.  Denies any chest pain.  She does have  some shortness of breath and dry cough that is chronic.  Denies any dysuria.  Comprehensive review of systems otherwise negative.   PHYSICAL EXAM:  VITAL SIGNS:  Temperature 97.4, pulse 131, respirations 24,  blood pressure 151/110, O2 saturation 96% on room air.  GENERAL:  This is a thin malnourished female who appears older than her  stated age.  HEENT: Normocephalic, atraumatic.  PERRL.  EOMI.  Oropharynx reveals dry  mucous membranes with dentures intact.  NECK:  Supple, no thyromegaly, no lymphadenopathy, no jugular venous  distension.  CHEST:  Lungs clear to auscultation bilaterally with good air movement.  HEART:  Tachycardiac rate, regular rhythm.  No murmurs, rubs, gallops.  ABDOMEN:  Tender especially in the left upper quadrant.  There is diminished  bowel sounds.  Soft.  RECTAL:  No stool in the vault.  She does have does have some external  hemorrhoids.  The effluent is heme negative.  GENITOURINARY:  Normal  external female genitalia.  EXTREMITIES:  No clubbing, edema, cyanosis.  SKIN:  Warm and dry.  No rashes.  NEUROLOGIC:  The patient is alert and oriented x3.  Cranial nerves II-XII  are grossly intact.  Nonfocal.   LABORATORY DATA:  CBC shows a white blood cell count of 12.3, hemoglobin  15.7, hematocrit 47.7, platelet count 258,000 with an absolute neutrophil  count of 10.  Sodium is 133, potassium 3.4, chloride 100, bicarb 25, BUN 15,  creatinine 0.7, glucose 111.  Liver function studies are all within normal  limits.  Lipase is 330.  Urinalysis reveals specific gravity of 1.036 and is  negative for nitrites and  leukocyte esterase.  There are 3 to 6 red blood  cells.   ASSESSMENT/PLAN:  1.  Recurrent pancreatitis in the setting of dehydration:  We will admit the      patient and put her on aggressive fluid volume rehydration.  We will use      D5 and half normal saline for this.  Will treat her conservatively for      now with antiemetics, pain medications, and n.p.o. status.  I will also      start her on a low-dose Duragesic patch to help for basal pain control      and use Dilaudid pushes for breakthrough pain.  She has no elevation of      liver function studies to suggest this is related to gallstone etiology.      Nevertheless, I will check an alcohol level and a urine drug screen.  I      will also supplement her with thiamine and folate.  2.  Hypokalemia:  Will add 40 mEq of potassium to her IV fluids and monitor      her electrolytes closely.  This is related to GI losses.  3.  History of alcohol abuse:  The patient denies any binge type drinking.      Nevertheless, I will check alcohol and      the urine drug screen level.  Will supplement thiamine and folate and      put her on her usual dose of Ativan 1 mg IV t.i.d.  4.  Prophylaxis:  Will initiate DVT prophylaxis with PAS hose and put her on      a proton pump inhibitors IV.      Hillery Aldo, M.D.  Electronically Signed     CR/MEDQ  D:  07/19/2005  T:  07/19/2005  Job:  [id number]   cc:   Lorelle Formosa, M.D.  Fax: [phone]   Jordan Hawks. Elnoria Howard, MD  Fax: [phone]

## 2010-05-22 NOTE — Procedures (Signed)
EEG NUMBER:  [id number].   HISTORY:  This is a 60 year old with seizures, who is having an EEG done to  evaluate for seizure activity.   PROCEDURE:  This is a portable EEG.   DESCRIPTION:  Throughout this portable EEG, there is a posterior dominant  rhythm of 9 Hz at 20-30 microvolts.  Background activity is symmetric,  mostly comprised of theta and lower alpha range activity at 15-30 microvolts  with photic stimulation or hyperventilation performed throughout this  recording.  The patient does not go to sleep during this tracing.  Throughout this record, there is no evidence of epileptiform activity or  electrographic seizures noted.   IMPRESSION:  This routine EEG is within normal limits in the awake state.      Bevelyn Buckles. Nash Shearer, M.D.  Electronically Signed     MWN:[id number]  D:  09/07/2005 19:01:03  T:  09/08/2005 10:36:13  Job #:  [id number]

## 2010-05-22 NOTE — H&P (Signed)
[facility]  Patient:    [name], PARVEEN Visit Number: [id number] MRN: [id number]          Service Type: EMS Location: [facility] Attending Physician:  Benny Lennert Dictated by:   Hal T. Stoneking, M.D. Admit Date:  05/07/2001 Discharge Date: 05/07/2001                           History and Physical  IDENTIFYING DATA:             The patient is a patient of the outpatient clinic, a 60 year old white female, who is a known alcoholic for many years complicated by peptic ulcer disease status post gastrectomy, history of recurrent pancreatitis.  States that she drinks 3 quarts of beer a day.  She started complaining about three days ago with abdominal pain and nausea and vomiting.  States she believes she has had a low-grade fever, positive diarrhea although denies any melena, occasionally sees a streak of blood in the vomitus but no gross hematemesis.  She has had no response with the nausea in the emergency room with the GI cocktail, Pepcid, Demerol, and Phenergan.  PAST MEDICAL HISTORY:  ALLERGIES:                    THORAZINE.  MEDICATIONS:                  None.  MEDICAL HISTORY:              Remarkable for recurrent pancreatitis, alcoholism, peptic ulcer disease.  No history of diabetes, coronary artery disease, or cancer.  She is status post partial gastrectomy.  FAMILY HISTORY:               Brother and father both alcoholics.  SOCIAL HISTORY:               She is married.  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  She has two sons.  She is a positive smoker.  REVIEW OF SYSTEMS:            Does complain of a cough productive occasionally of brownish sputum.  Denies any headache.  GI:  Please see HPI.  No chest pain.  Does complain of recent weight loss.  PHYSICAL EXAMINATION:  VITAL SIGNS:                  Pulse 107, respiratory rate 20, temperature 98.3, blood pressure 126/80.  GENERAL:                      She is very  cachectic.  HEENT:                        Pupils are equal, round and reactive to light. Vasculature normal.  Disks not well seen.  Oropharynx somewhat dry.  LUNGS:                        Clear.  HEART:                        Regular rate and rhythm without murmurs, rubs, or gallops.  ABDOMEN:                      Revealed markedly decreased bowel sounds; tender, especially in the epigastrium radiating to her back.  RECTAL:                       Normal tone.  Stool is heme negative.  NEUROLOGIC:                   Her speech is somewhat slurred.  She was very sleepy but she was oriented.  LABORATORY DATA ON ADMISSION:                 Urine unremarkable.  ETOH level 248.  Sodium 142, potassium 3.5, chloride 111, bicarb 20, glucose 95, BUN 6, creatinine 0.6, calcium 8.6, total protein 7.3, albumin 3.8, amylase elevated 177, lipase 38.  White count 16,100; hemoglobin 11.2; platelet count 381,000.  SGOT 38, SGPT 21, alk phos 121, total bili 0.8.  ASSESSMENT:                   1. Alcoholism with acute intoxication.  Need to                                  watch for signs of delirium tremens.                               2. Malnutrition, cachexia, secondary to problem                                  #1.                               3. Mild pancreatitis.  PLAN:                         Will admit.  Will get an ultrasound of the pancreas to see whether or not she has a pseudocyst.  Will make her n.p.o. Will follow her amylase.  Will hydrate her and give her multivitamins. Dictated by:   Hal T. Stoneking, M.D. Attending Physician:  Benny Lennert DD:  06/05/01 TD:  06/05/01 Job: [id number]
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
## 2010-05-22 NOTE — Discharge Summary (Signed)
[name], [name]                         ACCOUNT NO.:  [id number]   MEDICAL RECORD NO.:  [id number]                   PATIENT TYPE:  INP   LOCATION:  5511                                 FACILITY:  [facility]   PHYSICIAN:  Lorelle Formosa, M.D.           DATE OF BIRTH:  01/12/1950   DATE OF ADMISSION:  10/26/2001  DATE OF DISCHARGE:  10/30/2001                                 DISCHARGE SUMMARY   ADMISSION DIAGNOSES:  1. Severe epigastric pain secondary to gastritis.  2. Chronic and acute alcoholism.  3. Anemia.  4. Anorexia and marasmus.  5. Smoking addiction.   DISCHARGE DIAGNOSES:  1. Severe epigastric pain secondary to gastritis.  2. Chronic and acute alcoholism.  3. Anemia.  4. Anorexia and marasmus.  5. Smoking addiction.   CONDITION ON DISCHARGE:  Stable.   FOLLOWUP:  In my office in approximately one to two weeks.   DISCHARGE MEDICATIONS:  1. Ultracet one or two pills t.i.d. or q.i.d. p.r.n. pain.  2. Protonix 40 mg q.d.  3. Ativan 1 mg t.i.d.  4. Combivent inhaler 2 puffs q.i.d. p.r.n. wheezing.   DIET:  Bland.  The patient is cautioned to avoid alcohol and reduce smoking.   HISTORY OF PRESENT ILLNESS:  This patient is a 60 year old Caucasian woman  who presented to my office on the day of admission with protracted nausea  and vomiting and abdominal pain.  She was apparently in the emergency room  here approximately four times recently according to her husband, and sent  home each time.  The patient's chart was reviewed from her last admission  here on 08/03/01.  Her weight today was 80 pounds in my office.  Of noted is  the fact that the patient has do not re-admit this at [facility], [facility], and [facility].  The patient apparently has an alcoholic counselor which is of  questionable value.  She was brought by her husband who informed me he also  has an alcoholic problem.  It is also noted  that the patient was evaluated  by psychiatrist Dr. Jeanie Sewer, and recommended to transfer her [facility] which apparently was not feasible.  Current issues included  acute and chronic alcoholism, epigastric pain, recurrent, presumed to her  alcohol abuse, smoking addiction, chronic obstructive pulmonary disease, and  anemia.  The patient has had multiple rehabilitation programs, none of which  have been successful.   Past medical history, family history, social history, review of systems, are  outlined in her previous admission here on 08/03/01.   PHYSICAL EXAMINATION:  VITAL SIGNS:  Blood pressure 165/111, pulse 100,  respiratory rate 20, temperature 98.1 orally.  GENERAL:  The patient was a rather thin, marasmus-appearing Caucasian woman  who was laying supine on the stretcher moaning.  HEENT:  Pupils equal, round, reactive to light and accommodation.  Extraocular movements  were intact.  Mouth revealed moist mucus membranes  with tongue and uvula both midline.  CHEST:  Clear to auscultation.  HEART:  Regular rate and rhythm without murmur.  ABDOMEN:  Soft, flat, bowel sounds were active.  She had some tenderness in  the epigastric region to palpation.  PELVIC:  Deferred.  RECTAL:  Deferred.  NEUROLOGIC:  Grossly physiologic except for weakness.  The patient was  ambulatory without significant difficulty.  SKIN:  No lesions.   LABORATORY DATA:  The patient had a PICC line placed in the left arm by  procedural radiology.  This was done on admission as the patient had no  venous access via the IV team members.  White blood cell count 11.5,  hemoglobin 10.8, hematocrit 33.8, platelets 190,000, 75% neutrophils, 19%  lymphocytes, 5% monocytes, 1% eosinophil, and no basophils.  Chemistries  revealed a sodium of 134 with repeat being 138, potassium 3.7, chloride 102,  CO2 21, glucose 89.  Repeat glucose 115, with a potassium on repeat was 2.7.  ______ were normal.    HOSPITAL COURSE:  The patient was given IV fluids and Phenergan for nausea,  and Dilaudid for pain.  She was given Protonix which was changed to p.o. and  placed on a lorazepam alcohol withdrawal measurement protocol.  The patient  gradually stabilized, and was able to tolerate regular diet.  The patient  was sent home with PICC line with arrangements to have flushes and  appropriate outpatient care.  The patient had difficulty with swallowing  down oral medications, and being established on a diet.  On projected day of  discharge, medical director of care management saw the patient per protocol  as I was told, and suggested justification for further inpatient type of  stay.                                               Lorelle Formosa, M.D.    WWM/MEDQ  D:  12/06/2001  T:  12/06/2001  Job:  [id number]

## 2010-05-22 NOTE — Discharge Summary (Signed)
[facility]  Patient:    [name], [name] Visit Number: [id number] MRN: [id number]          Service Type: EMS Location: ED Attending Physician:  Donnetta Hutching Dictated by:   Netta Cedars, M.D. Admit Date:  09/26/2000 Discharge Date: 09/26/2000                             Discharge Summary  INTRODUCTION:  [name] is a 60 year old female who was admitted for her third admission after expressing suicidal ideations.  She planned to kill herself by throwing herself in front of a car.  Depression increased for the past three months.  At the same time, she started abusing her pain pills and benzodiazepines.  A couple of days prior to admission, she ran out of medications.  Having difficulty and a lot of physical compliance.  She has recurred to drinking and since she ran out of pills, the amount of alcohol has increased.  The patient also reported an anxiety attack prior to admission. She was on Ativan 1 mg twice a day, Vicodin 5 mg every four to six hours, and Librium p.r.n. 10 mg.  The patient admitted that she used over 218 narcotic pills, 90 antianxiety pills, and 25 Dilaudid suppositories in two weeks.  She has a history of being suicidal before at [facility] and our unit as well.  MEDICAL PROBLEMS:  History of COPD, ulcers, pancreatitis, and chronic back pain.  DIAGNOSTIC IMPRESSION: Axis I.    1. Depressive disorder not otherwise specified.            2. Apparent benzodiazepine dependence.            3. Alcohol abuse.            4. Anxiety disorder not otherwise specified. Axis V.    Global assessment of functioning on admission between 25-30.            Maximum in the past year estimated at 60.  HOSPITAL COURSE:  Considering large amounts of both narcotics and benzodiazepines, the patient was discharged on double protocol of phenobarbital and Wytensin.  Because of the borderline value of potassium, K-Dur was introduced at 20  mg twice a day.  The patient complained of some nausea which was helped with Phenergan.  She was seen by a medical consultant from the [facility] [facility], Jetty Duhamel, M.D., and was introduced on Toradol on a p.r.n. basis and Zofran 4-8 mg every six hours p.r.n. nausea.  Because of the present symptoms, Celexa was reintroduced at a dose of 20 mg daily.  On August 02, 2000, the patient felt better with no dangerous ideations.  She had a small fall on the last night, but no apparent injury and no complaints after the fall.  No neurological symptoms.  Mental status examination revealed a lack of suicidal or homicidal ideation or psychosis.  Alert and oriented x 3. The treatment and the undersigned felt that she was maximum benefit from her hospitalization and could be safely discharged home.  The vital signs throughout the hospitalization were stable with blood pressure 120/75 and normal temperature, respiratory rate, and pulse.  Review of additional laboratory work showed initial chemistry 17 with decreased sodium and potassium which normalized on August 02, 2000.  The liver panel was normal.  The CBC was normal.  CMET normal.  Urinalysis normal.  The initial urine drug screen was positive for benzodiazepines and  opiates.  The urine pregnancy test was negative.  The ultrasound of the abdomen showed no acute pathology.  The chest x-ray was normal.  DISCHARGE DIAGNOSES: Axis I.    1. Depressive disorder not otherwise specified.            2. Alcohol abuse.            3. Opioid and benzodiazepine dependence.            4. Anxiety disorder not otherwise specified. Axis II.   Personality disorder not otherwise specified. Axis III.  1. Leg pain.            2. Chronic obstructive pulmonary disease.            3. Status post pancreatitis by history. Axis IV.   Psychosocial stressors:  Moderate with family problems and            health problems. Axis V.    Global assessment of functioning on  admission of up to 30,            highest 60, and upon discharge 55-60.  DISCHARGE MEDICATIONS: 1. Neurontin 100 mg three times a day, which was introduced during this    hospitalization and helped with anxiety. 2. Celexa 20 mg daily. 3. Protonix 40 mg daily. 4. K-Dur 20 mEq daily. 5. Toradol 10 mg every four hours as needed for severe pain. 6. Zofran 8 mg half to one tablet every six hours as needed for nausea. 7. Reglan 10 mg before every meal up to four doses per day.  SPECIAL INSTRUCTIONS:  The patient is supposed to call if she has problems with medications or recurrence of danger thoughts.  FOLLOW-UP:  She has follow-up with Dr. ______ on August 04, 2000, at 2 p.m.  DISPOSITION:  The patient understood the instructions.  At the time of discharge, she did not have any side effects from medications.  At the time of discharge, she was discharged to an outpatient treatment center in good condition. Dictated by:   Netta Cedars, M.D. Attending Physician:  Donnetta Hutching DD:  09/27/00 TD:  09/28/00 Job: [id number]

## 2010-05-22 NOTE — Op Note (Signed)
. [facility]  Patient:    [name], [name]                      MRN: [id number] Adm. Date:  [id number] Attending:  Eino Farber                           Operative Report  PREOPERATIVE DIAGNOSIS:  Intractable peptic ulcer disease with partial gastric outlet obstruction.  POSTOPERATIVE DIAGNOSIS:  Intractable peptic ulcer disease with partial gastric outlet obstruction, pathology pending.  PROCEDURE:  Hemigastrectomy and vagectomy.  SURGEON:  Mardene Celeste. Lurene Shadow, M.D.  ASSISTANT:  Marnee Spring. Wiliam Ke, M.D.  ANESTHESIA:  General.  CLINICAL NOTE:  The patient is a 60 year old woman with longstanding history of ETOH abuse and chronic upper abdominal pain, diagnosed with a pyloric channel ulcer and a partial gastric outlet obstruction.  She has been maximally treated with proton pump inhibitors and antacids and antibiotics without any relief of her pain and symptoms.  She is readmitted for persistent nausea, vomiting, and pain.  She is brought to the operating room now for hemigastrectomy and vagectomy.  DESCRIPTION OF PROCEDURE:  Following the induction of anesthesia, with the patient positioned supinely, the abdomen was routinely prepped and draped to be included in the sterile operative field.  A midline incision extending from the xiphoid to the umbilicus was deepened through the skin and subcutaneous tissue, taken through the linea alba into the abdomen.  The abdomen was entered and explored.  The stomach appeared to be decompressed without any evidence of persistent obstruction.  The pylorus showed some moderate scarring.  The pylorus was otherwise quite mobile.  The liver was free of any evidence of any stigmata of chronic alcoholism.  The pancreas appeared to be soft and pliable without any evidence of chronic pancreatitis.  The stomach was elevated and dissection carried down through the gastrocolic omentum into the lesser sac.   Adhesions behind the stomach were lysed and the pancreas fully exposed sufficient for exploration.  The gastrocolic ligament was taken down between clamps, carrying dissection along the greater curvature, down to and below the pylorus, securing all bleeders with 2-0 silk sutures.  The right gastric artery was taken between clamps and doubly secured with 0 silk sutures.  The left gastric artery was similarly doubly clamped and secured with double ties of 0 silk.  I used a TA90 4.5 stapler to transect the stomach at approximately a hemigastrectomy, and taking all of the antrum and a portion of the gastric body.  I used a GIA stapler to transect the duodenum just below the pylorus.  The specimen was removed and forwarded for pathologic evaluation.  I created a Billroth I type end-to-end anastomosis in two layers using interrupted 2-0 silk sutures on the posterior layer, followed by a through-and-through serosal-mucosal layer of 3-0 silk in a running interlocking suture on the back wall and in a Connell suture on the front wall.  The anastomosis was completed with interrupted Lembert sutures of 2-0 silk on the front wall.  The resulting anastomosis was noted to be widely patent.  The esophagus was mobilized prior to all this, and a large posterior vagus trunk was identified, doubly clipped, and a segment of the nerve was removed.  An anterior vagus trunk was not at all identified despite a rather diligent search.  The entire esophagus was denuded of all fibrous tissues to ensure that there  was no additional vagus trunk noted.  All areas of dissection were then checked for hemostasis.  Sponge, instrument, and sharp counts were verified and the wound closed in layers as follows:  The midline was closed in a running suture of #1 Novofil, subcutaneous tissue was irrigated, and skin closed with staples.  Sterile dressings applied, anesthetic reversed, and patient removed from the operating room to  the recovery room in stable condition, having tolerated the procedure well. DD:  01/07/00 TD:  01/07/00 Job: [id number]

## 2010-05-22 NOTE — Procedures (Signed)
. [facility]  Patient:    [name], [name] Visit Number: [id number] MRN: [id number]          Service Type: MED Location: [phone] Attending Physician:  Alfonso Ramus Dictated by:   Hedwig Morton. Juanda Chance, M.D. [facility] Proc. Date: 01/25/01 Admit Date:  01/21/2001   CC:         Dineen Kid. Reche Dixon, M.D.                           Procedure Report  PROCEDURE: Upper endoscopy.  ENDOSCOPIST: Hedwig Morton. Juanda Chance, M.D.  INDICATIONS FOR PROCEDURE: This 60 year old black female, with chronic abdominal pain and a history of pancreatitis, most likely due to use of alcohol, was readmitted with intractable nausea, vomiting, weakness, weight loss, and elevated amylase and lipase consistent with low-grade pancreatitis. She is undergoing upper endoscopy because of a history of gastroparesis, peptic ulcer disease, Billroth II gastrojejunostomy in the past.  She is undergoing upper endoscopy for further evaluation of her symptoms to rule out gastric outlet obstruction.  ENDOSCOPE: Fujinon single-channel video endoscope.  SEDATION: Versed 18 mg IV, Fentanyl 100 mcg IV.  FINDINGS: The Fujinon single-channel video endoscope was passed under direct vision through the posterior pharynx into the esophagus.  The patient was monitored by pulse oximetry and oxygen saturations were normal.  She was cooperative.  Starting in the proximal esophagus there were linear erosions and abrasions of the esophagus with mucosal hemorrhages.  Biopsies of these were taken to rule out viral esophagitis.  There was no active bleeding.  The distal esophagus was normal.  There was a small easily reducible hiatal hernia, which was not clinically significant.  The GE junction was at 40 cm from the incisors.  Stomach - The stomach was insufflated with air and showed gastric remnant extending from 40-50 cm from the incisors.  The gastric remnant size was 10 cm and showed no retained food.  There  was a small amount of bilious material along the greater curvature of the stomach.  Gastrojejunostomy was consistent with Billroth I anastomosis, with only one efferent limb.  It was wide open. There was no erythema or any inflammatory change.  One retained suture was located at the orifice of the anastomosis and this was removed with biopsy.  Jejunum - The jejunum was entirely normal.  IMPRESSION:  1. Billroth I gastrojejunostomy with wide open anastomosis.  2. No evidence of retained food.  3. Grade 3 esophagitis, status post biopsies.  PLAN: The above findings do not explain the patients chronic and acute abdominal pain.  This will be further evaluated with MRCP to look for dominant stricture in her main pancreatic duct, also to look at her common bile duct. Dictated by:   Hedwig Morton. Juanda Chance, M.D. [facility] Attending Physician:  Alfonso Ramus DD:  01/25/01 TD:  01/25/01 Job: [id number]

## 2010-05-22 NOTE — Discharge Summary (Signed)
[place]  Patient:    [name], [name] Visit Number: [id number] MRN: [id number]          Service Type: EMS Location: ED Attending Physician:  Donnetta Hutching Dictated by:   Lonia Blood, M.D. Admit Date:  07/29/2001 Disc. Date: 07/25/01   CC:         [facility], M.D.   Discharge Summary  DATE OF BIRTH:  May 11, 1950.  DISCHARGE DIAGNOSES: 1. Alcoholic gastritis. 2. Chronic obstructive pulmonary disease. 3. History of frequent benzodiazepine and opiate abuse. 4. Alcohol abuse. 5. Alcohol withdrawal syndrome. 6. Right foot lesion. 7. Anemia.  DISCHARGE MEDICATIONS: 1. Combivent two puffs q.i.d. 2. Protonix 40 mg p.o. b.i.d. 3. Folic acid 1 mg p.o. q.d. 4. Vitamin B1 100 mg p.o. q.d. 5. Multivitamin p.o. q.d. 6. Tylenol 650 mg p.o. q.4h. p.r.n. for pain.  CONDITION ON DISCHARGE:  The patient was discharged in fair condition.  She was strongly advised to go to a rehab facility for help with her alcohol problem.  She was instructed to follow up in the outpatient clinic with Lonia Blood, M.D., on August 02, 2001, at 3 p.m.  PROCEDURES: 1. On July 17, 2001, plain film of the abdomen was obtained, which showed a    question of ileus with a question of right lower quadrant mass. 2. On July 19, 2001, a CT of the abdomen and pelvis, which showed unremarkable    abdominal CT.  No evidence of pelvic masses.  No evidence of inflammatory    process.  No fluid collections.  The bowel and other structures appeared to    be normal.  The abdominal parenchyma was grossly normal.  No evidence of    masses or adenopathy.  No evidence of inflammatory process and no fluid    collections.  HISTORY OF PRESENT ILLNESS:  This is a 60 year old woman with history of intractable epigastric pain, status post hemigastrectomy with vagotomy and Billroth anastomosis done by Dr. Orson Slick, history of recurrent questionable pancreatitis,  that presented five days ago to the emergency room and hospital for epigastric pain.  A Demerol prescription was given, but the pain did not go away.  She presented again to our emergency room complaining of epigastric pain.  PAST MEDICAL HISTORY:  Significant for gastritis, gastroparesis, GERD, COPD, tobacco use, polysubstance abuse, normocytic anemia, cellulitis of the dorsum of the right foot, status post appendectomy, status post hysterectomy secondary to fibroids.  ALLERGIES:  THORAZINE.  FAMILY HISTORY:  Mother died at age 86 due to alcohol abuse.  Father died at age 51 due to pancreatitis.  MEDICATIONS:  Nexium, Ativan, and Serevent.  SOCIAL HISTORY:  She has two children, which are grown up and moved away.  She is using alcohol, three quarts of beer a day.  Tobacco:  One pack of cigarettes per day for 34 years.  REVIEW OF SYSTEMS:  No nausea, no vomiting, no constipation.  Mild diarrhea. No hematochezia, no melanotic stool.  Been vomiting blood, about a cup for four days.  PHYSICAL EXAMINATION:  VITAL SIGNS:  Temperature was 97.3, heart rate 75, respiratory rate 20, blood pressure 132/75.  Saturation 96% on room air.  GENERAL:  The patient is in no acute distress.  Alert and oriented.  HEENT:  Pupils equal, round and reactive to light and accommodation, extraocular movements intact.  NECK:  Supple with left cervical lymphadenopathy.  No JVD.  CHEST:  Clear to auscultation bilaterally.  CARDIAC:  Regular rate and rhythm, no murmur.  ABDOMEN:  Nondistended, soft, bowel sounds are present.  EXTREMITIES:  There is a small ulcer on the right dorsum of the foot without any exudate with _____ present.  NEUROLOGIC:  Cranial nerves II-XII intact.  No asterixis.  SKIN:  Without rashes.  ADMISSION LABORATORY DATA:  Leukocytes were 6, hemoglobin 9.5, platelets 156. Sodium 152, potassium 4.1, chloride 101, CO2 26, BUN 9, creatinine 0.5. Amylase _____, lipase 19.  INR 1,  PT 30.3.  Guaiac-negative stool.  Abdominal x-ray:  Question of possible ileus.  HOSPITAL COURSE:  #1 - EPIGASTRIC PAIN:  We obtained a CT of the abdomen with contrast, which failed to show any significant changes in the pancreas.  The lipase and amylase were both normal on repeated evaluation.  For the epigastric pain she was started on Ultram 50 mg p.o. q.4h. p.r.n., but we gradually weaned her off from the Ultram and she responded very well with increased dose of proton pump inhibitor, gradually resuming p.o. feeding, and constant reassurance that the pain is going to go away eventually if she stops drinking and starts eating properly.  #2 - ALCOHOL WITHDRAWAL:  The second day of admission the patient had a severe hypertensive episode with tremors and possible delirium.  We started her on an Ativan protocol, gradually weaned her off the Ativan, and by the day of discharge she was day 7 without using any alcohol, displaying just a mild tremor that responded actually to constant reassurance and we did not have to use increased dosage of a benzodiazepine.  We strongly encouraged the patient to seek treatment and try to get into a rehab facility to help her with the alcohol problem.  We were not considering giving her Ativan or other benzodiazepines at home, as the patient has a long history of abusing those and was frequently admitted to [facility] because of problems with abusing medications.  #3 - CHRONIC OBSTRUCTIVE PULMONARY DISEASE:  The patient continued to smoke during all this admission, but the lung function was normal.  She was walking around without getting short of breath.  I strongly advised her to quit smoking.  She will continue to use her inhaler.  #4 - CELLULITIS OF THE RIGHT LEG:  The patient was afebrile.  The wound looks  clear, and she will continue to have home health to help her with dressing of that.  DISCHARGE LABORATORY DATA:  Sodium 140,  potassium 3.2, chloride 101, bicarb 30, BUN 6, creatinine 0.6, glucose 88, calcium 9.1. Dictated by:   Lonia Blood, M.D. Attending Physician:  Donnetta Hutching DD:  07/26/01 TD:  08/01/01 Job: [id number]

## 2010-05-22 NOTE — Discharge Summary (Signed)
[name], [name] NO.:  [id number]   MEDICAL RECORD NO.:  [id number]          PATIENT TYPE:  IPS   LOCATION:  0307                          FACILITY:  [facility]   PHYSICIAN:  Jeanice Lim, M.D. DATE OF BIRTH:  1950-08-18   DATE OF ADMISSION:  03/12/2005  DATE OF DISCHARGE:  03/16/2005                                 DISCHARGE SUMMARY   IDENTIFYING DATA:  This is a 60 year old married Caucasian female  voluntarily admitted.  Presented with a history of opiate abuse, using pain  pills for the past 30 years.  Been going to emergency room for problems with  pain, getting small increments of pain pills.  Reports history of alcohol  abuse.  Sober for over a year.  Relapsed on Monday, drinking two beers, last  pain pill use was on Thursday prior to admission.  Also received some  Dilaudid.  Had lost 50 pounds over one year and had not been sleeping,  reporting about one hour of sleep on average.  Reports feeling that she  needs to get off her pain medications but is worried that she may not be  able to do so.  Denied any depressive or suicidal thoughts.   PAST PSYCHIATRIC HISTORY:  [facility] in the past.  No history  of suicide attempts.  No current outpatient treatment.  The patient reported  a history of seizure activity related to possible drinking, positive for  history of blackouts.   MEDICATIONS:  Had been on Vicodin, Dilaudid.  Reports getting prescription  pain pills from Dr. Ronne Binning.   ALLERGIES:  THORAZINE.   PHYSICAL EXAMINATION:  Physical and neurologic exam essentially within  normal limits.  Some nausea and vomiting, temperature was elevated, pulse  elevated and blood pressure elevated, showing possible withdrawal and  received clonidine in the emergency room.   LABORATORY DATA:  CBC within normal limits.  Alcohol level less than 5.   MENTAL STATUS EXAM:  Fully alert, cooperative female.  Fair eye contact.  Holding rag to face,  saying she was going to vomit.  Speech clear.  The  patient hurting, feeling anxious.  Appeared as if she was in physical  distress when spoken to, at times when not spoken to but, indirectly  observed, the patient did not appear to be in a great deal of discomfort.  Affect was somewhat blunted.  Thought processes goal directed.  No evidence  of psychosis.  Cognitively intact.  Judgment and insight impaired.   ADMISSION DIAGNOSES:  AXIS I:  Opiate dependence.  Alcohol, likely history  of dependence, recent relapse.  Rule out substance-induced mood disorder.  Possible mood disorder not otherwise specified.  AXIS II:  Deferred.  AXIS III:  Hepatitis C, chronic obstructive pulmonary disease, chronic  pancreatitis.  AXIS IV:  Other psychosocial issues and medical problems.  AXIS V:  35/55.   HOSPITAL COURSE:  The patient was admitted and ordered routine p.r.n.  medications and underwent further monitoring.  Was encouraged to participate  in individual, group and milieu therapy.  The patient was recommended to be  detoxed  from opiates and take over medications as needed, including Seroquel  and Neurontin for pain and agitation.  Nutrition consult.  Contact husband  to rule out safety concerns.  The patient reported a positive response at  times, insisting on getting pain medication.  However, treatment plan was  followed including giving patient Toradol, Zofran, Seroquel p.r.n., Celexa  targeting mood.  Seroquel was optimized to restore sleep cycle and patient  reported a positive response with significant improvement once she had an  intact night of sleep.   CONDITION ON DISCHARGE:  The patient was discharged in improved condition  for outpatient follow-up and had a plan in place.  Appearing to be  motivated.  Continue recommendations, given medication education again at  the time of discharge.   DISCHARGE MEDICATIONS:  1.  Remeron 7.5 mg q.h.s.  2.  Celexa 20 mg q.a.m.  3.  Seroquel  100 mg at 9 p.m.  4.  Zyprexa 5 mg at 9 p.m.  5.  Neurontin 100 mg q.i.d. p.r.n.  6.  Ativan 1 mg t.i.d. and 1 per day as needed.  7.  Reglan 10 mg q.i.d.  8.  Protonix 40 mg q.a.m.  9.  Trazodone 50 mg q.h.s.  10. Zofran 8 mg q.12h. p.r.n.   FOLLOW UP:  The patient is to follow up at the [facility].   DISCHARGE DIAGNOSES:  AXIS I:  Opiate dependence.  Alcohol, likely history  of dependence, recent relapse.  Rule out substance-induced mood disorder.  Possible mood disorder not otherwise specified.  AXIS II:  Deferred.  AXIS III:  Hepatitis C, chronic obstructive pulmonary disease, chronic  pancreatitis.  AXIS IV:  Other psychosocial issues and medical problems.  AXIS V:  GAF on discharge 55.   There were no safety issues and patient's condition was significantly  improved.  However, prognosis guarded in light of patient's history of being  unable to stay off of pain medications and in light of the severity of her  addiction history.      Jeanice Lim, M.D.  Electronically Signed     JEM/MEDQ  D:  04/04/2005  T:  04/06/2005  Job:  [id number]
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
## 2010-05-22 NOTE — H&P (Signed)
[name], [name]               ACCOUNT NO.:  [id number]   MEDICAL RECORD NO.:  [id number]          PATIENT TYPE:  INP   LOCATION:  0101                         FACILITY:  [facility]   PHYSICIAN:  Lonia Blood, M.D.       DATE OF BIRTH:  [birth year]   DATE OF ADMISSION:  06/16/2005  DATE OF DISCHARGE:                                HISTORY & PHYSICAL   PRIMARY CARE PHYSICIAN:  Lorelle Formosa, M.D.   CHIEF COMPLAINT:  Nausea, vomiting, cannot keep anything down.   HISTORY OF PRESENT ILLNESS:  [name] is a 60 year old woman with a  history of chronic pancreatitis and history of peptic ulcer disease status  post partial gastrectomy who reports that for the past week she has been  having increasing epigastric pain with increased episodes of nausea and  vomiting, to the point where she is unable to keep anything down.  She has  lost a significant amount of weight, and she has gotten to a point where she  just cannot keep down even liquids.  She was brought to the emergency room  by her husband for further evaluation.   PAST MEDICAL HISTORY:  1.  Alcohol abuse with chronic pancreatitis.  2.  Tobacco abuse.  3.  COPD.  4.  Peptic ulcer disease status post partial gastrectomy.  5.  Status post hysterectomy.  6.  Iron-deficiency anemia.  7.  Chronic right subclavian vein occlusion.  8.  Gastroparesis.  9.  History of opiate abuse.   MEDICATIONS AT HOME:  1.  Ativan 1 mg three times a day.  2.  Protonix 40 mg once a day.  3.  Dilaudid 4 mg as needed for pain.   SOCIAL HISTORY:  The patient is married.  Lives with her husband.  She is  currently unemployed.  The patient used to be a heavy alcohol abuser, but  she quit about 2 years ago.  The patient continues to smoke 2 packs of  cigarettes every day.   FAMILY HISTORY:  Noncontributory.   REVIEW OF SYSTEMS:  Positive for some shortness of breath.  Negative for  chest pain.  Negative for constipation or diarrhea.   PHYSICAL  EXAMINATION:  VITAL SIGNS:  Temperature 98.7, blood pressure  141/90, pulse 107, respirations 18, saturation 96% on room air.  GENERAL:  The patient is a frail, cachectic woman in no acute distress  sitting on a stretcher, alert and oriented to place, person and time.  HEENT:  Throat is clear.  NECK:  Without JVD.  CHEST:  Clear to auscultation bilaterally with some increased expiratory  phase, but no rhonchi, no crackles.  HEART:  Regular rate and rhythm without murmurs, rubs, or gallops.  ABDOMEN:  Soft.  There is some epigastric tenderness.  No rebound.  There is  some guarding.  EXTREMITIES:  Lower extremities have no edema.  SKIN:  Warm and dry.  NEUROLOGIC:  Nonfocal.   LABORATORY DATA:  At the time of admission, sodium was 135, potassium 3.7,  chloride 104, bicarbonate 23, BUN 14, creatinine 0.7, potassium 9, protein  6.9, albumin  3.6.  AST 17, ALT 14, alkaline phosphatase 87.  Urinalysis is  potassium for ketones, positive for proteins.  White blood cell count is  10,000, hemoglobin is 15.6, platelet count 372.   ASSESSMENT AND PLAN:  1.  Intractable nausea, vomiting and dehydration secondary to chronic      pancreatitis.  Our plan is to admit the patient to the hospital, start      he on parenteral fluids, as well as parenteral opiates and parenteral      antiemetics.  Also, will proceed with careful monitoring to rule out any      gastrointestinal bleeding, although my suspicion for the above is very      low.  The patient will be started on Protonix intravenously, and she      will be very closely monitored.  2.  Tobacco abuse.  The patient will be counseled and started on a nicotine      patch.  3.  Gastroparesis.  The patient will be given Reglan as needed for her      nausea.  4.  Deep vein thrombosis prophylaxis will be done using PAS hose.      Lonia Blood, M.D.  Electronically Signed     SL/MEDQ  D:  06/16/2005  T:  06/16/2005  Job:  [id number]   cc:   Lorelle Formosa, M.D.  Fax: [phone]

## 2010-05-22 NOTE — Discharge Summary (Signed)
[facility]  Patient:    [name], [name] Visit Number: [id number] MRN: [id number]          Service Type: MED Location: 3W [id number] Attending Physician:  Katy Apo. Dictated by:   Renford Dills, M.D. Admit Date:  05/15/2001 Discharge Date: 05/25/2001                             Discharge Summary  DATE OF BIRTH:  2050/01/18  DISCHARGE DIAGNOSES: 1. Chronic abdominal pain, stable at time of discharge, still requiring daily   narcotics in the form of Vicodin. 2. History of recurrent pancreatitis secondary to ETOH abuse. 3. Alcoholism. 4. History of peptic ulcer disease, status post Billroth I. 5. History of upper gastrointestinal bleed. 6. Normocytic anemia. 7. Hypokalemia, resolved at discharge. 8. Status post Billroth I, gastrojejunostomy in January 2002. 9. Cellulitis dorsum of right foot, a 4 x 5 cm area of erythema with underlying denuted skin.  No signs of infection at the time of discharge.  DISCHARGE MEDICATIONS: 1. Multivitamin one q.d. 2. Thymine 100 mg q.d. 3. Folic acid 1 mg q.d. 4. Bacitracin ointment to wound b.i.d. 5. Protonix 40 mg q.d. 6. Reglan 10 mg t.i.d. and h.s. 7. Ativan 1 mg t.i.d. p.r.n. 8. Vicodin 5/500 mg one p.o. t.i.d. p.r.n.  FOLLOWUP: 1. [facility] on May 30, 2001, at 2:15 p.m. 2. The patient will be followed up by [facility] who will assist [place] with home care.  DISCHARGE INSTRUCTIONS:  The patient is asked to refrain from substance abuse, i.e., alcohol, and try to limit use of Ativan and Vicodin to an as needed basis.  CONSULTATIONS:  None.  STUDIES:  The patients CBC was essentially normal except for normocytic anemia.  Hemoglobin at discharge 9.  Normal MCV.  BMET within normal limits. AST and ALT within normal limits.  Iron 17, TIBC 308, percent saturation 6. Urine culture negative.  Serum H. pylori negative.  Abdominal ultrasound reported as negative.  No evidence of pancreatic ductal  dilatation. Gallbladder within normal limits, no stones.  CT of abdomen and pelvis showed no acute abnormalities.  EKG without acute change.  PROCEDURES:  The patient had a PICC line placed for IV access.  HISTORY OF PRESENT ILLNESS:  The patient is a 60 year old white female with multiple medical problems who presented to [facility] Emergency Department with the complaint of abdominal pain, severe in nature, similar to prior attacks of pancreatitis.  The patient had also complained of nausea, vomiting, and inability to maintain p.o. intake, and multiple loose stools which recurred, yellow, no blood.  The patient also does admit to daily alcohol use, however, most importantly the patient has been without her Ativan and Vicodin which she has been taking on a chronic basis.  In the ED, the patient was hemodynamically stable, but with severe complaints of abdominal discomfort.   Admission was deemed necessary for further evaluation and treatment.  PAST MEDICAL HISTORY:  Please see problem list.  MEDICATIONS: 1. Ativan q.8h. 2. Vicodin q.8h.  SOCIAL HISTORY:  Positive for one pack per day x34 years, 3 quarts of beer a day, no drugs.  PAST SURGICAL HISTORY: 1. Significant for surgery for the ulcer by Dr. Revonda Humphrey, Billroth I. 2. The patient had an appendectomy approximately two years ago. 3. Left breast biopsy in the past, states results no cancer.  Denies cholecystectomy, no hysterectomy.  The patient had an esophagogastroduodenoscopy which showed grade  III esophagitis in January 2003, and a MR that showed no obstruction, normal pancreatic duct, and no intrahepatic ductal dilatation.  ALLERGIES:  THORAZINE.  HOSPITAL COURSE:  #1 - The patient is a 60 year old white female admitted to [facility] for evaluation of abdominal pain.  The differential diagnoses included pancreatitis, gallbladder disease, however, this was doubtful with normal bilirubin, peptic  ulcer disease/gastritis, or some other occult process.  The patient was admitted to the medicine floor.  There, she was started on multivitamin, thymine, and folate, and given analgesia for abdominal pain.  A CT scan of the abdomen was performed to rule out ______ process.  Her CT scan was negative for an occult abscess or other occult process.  The patient had serial labs of amylase, lipase, and liver function tests which were essentially within normal limits.  The patients pain control was treated with Vicodin and Ativan p.r.n.  There were no other etiologies for the patients abdominal pain found during this hospitalization.  The patient was discharged home in stable condition.  #2 - ALCOHOLISM:  The patient has been counseled throughout this hospitalization on the need to stop consuming excess alcohol.  The patient was given multivitamin, thymine, folate, and IV fluids, and there were no problems with DTs throughout this hospitalization.  #3 - HYPOKALEMIA:  Repleted.  #4 - WEIGHT LOSS:  The patient stated that she had lost approximately 60 pounds in the last six months.  Differential to that is secondary to her chronic abdominal pain with poor p.o. intake, depression, or occult CA. Recommended that the patient have age appropriate cancer screening, as stated CT had no abdominal findings.  Her breast examination was within normal limits.  We will defer the patient for a GYN evaluation to outpatient followup, as there was no clinical indication to perform this at this time, especially with the fact that the patients weight increased during this hospitalization as her p.o. improved, therefore, the patients weight loss was secondary to alcoholism and depression.  #5 - ATIVAN AND VICODIN WITHDRAWAL:  As the patient has been on these medications for chronic time, it was felt prudent to restart these medications.  At the time we attempted to wean these medications, however, the patient  clearly demonstrated the need for these medications as pain increased  and erratic behavior increased, therefore, these medications were continued. It is hopeful that on an outpatient basis the patient will be able to be weaned off these medications.  #6 - CELLULITIS OF RIGHT FOOT:  The patient had IV infiltration in her right foot.  Had subsequent erythema and desclamation of the skin an area of 3 x 5 cm.  The patient was treated empirically with antibiotics in the form of quinoline, and local wound care.  There was no foul odor or discharge from the wound.  The patient was asked to keep her foot elevated.  However, this was somewhat problematic as the patient continued wanting to get up and smoke, which seemed to retard healing process.  Also the patients poor nutrition was a contributing factor to poor healing as well.  The patient will be discharged to home, will have local wound care, follow up with [facility], and will be asked to put Bacitracin ointment on the wound on a b.i.d. basis.  #7 - ANEMIA, MULTIFACTORIAL IN ETIOLOGY, SECONDARY TO CHRONIC DISEASE, ALCOHOLISM, AS WELL AS IRON-DEFICIENCY FROM GASTRITIS:  The patients rectal examination was heme negative on admission, and there were no signs of any blood loss  per rectum during this hospitalization.  The patient will be discharged on ferrous sulfate, we asked her to continue that.  We expect her to have improvement in her hemoglobin with continual proton pump inhibitor, avoidance of alcohol, and other agents that may be irritant to the gastric mucosa.  As stated, it is known that the patient has a history of peptic ulcer disease, however, esophagogastroduodenoscopy in 1/03, was negative for any active ulcer, and just showed a grade III esophagitis, which is most likely self-induced on the patients alcoholism.  H. pylori titer was checked during this hospitalization which was insignificant.  At this time, the patient is medically  stable for discharge, will be discharged to home with followup. Dictated by:   Renford Dills, M.D. Attending Physician:  Renford Dills D. DD:  05/25/01 TD:  05/28/01 Job: [id number]

## 2010-05-22 NOTE — Discharge Summary (Signed)
[place]. [facility]  Patient:    [name], [name]                      MRN: [id number] Adm. Date:  [id number] Disc. Date: [id number] Attending:  Eino Farber Dictator:   Dionne D. Su Hilt, N.P.                           Discharge Summary  ADMISSION DIAGNOSES: 1. Acute pancreatitis, recurrent and clinical. 2. Pyloric peptic ulcer disease, history of; rule out active with    uncontrolled vomiting. 3. Chronic obstructive pulmonary disease with asthma. 4. Gastroesophageal reflux disease, history of. 5. Tobacco abuse, history of. 6. Myofascial pain syndrome.  DISCHARGE DIAGNOSES: 1. Pyloric gastric ulcer with partial gastrointestinal obstruction. 2. Acute pancreatitis, recurrent, resolving. 3. Chronic obstructive pulmonary disease with asthma. 4. Gastroesophageal reflux disease. 5. Anxiety and depression.  REASON FOR HOSPITALIZATION:  The patient is a 60 year old white female admitted from the ED with uncontrolled epigastric pain with uncontrolled nausea and vomiting with a history of recurrent acute pancreatitis.  ALLERGIES:  No known drug allergies.  LABS AND STUDIES:  CT of the abdomen showed mild distention of the stomach with fluid and air but no CT findings for gastric outlet obstruction. Findings could be due to gastroparesis, otherwise unremarkable CT examination of the abdomen.  Specifically, I do not see any CT evidence for pancreatitis. CT of the pelvis showed simple-appearing cyst on the right ovary, otherwise unremarkable CT of the pelvis.  On November 29, 1999, CBC showed WBC of 7.7, hemoglobin 12.4, hematocrit 34.7, platelets 323. Chemistry studies on November 29, 1999, was within normal limits.  On November 30, 1999, amylase was 79, lipase was less than 10.  HOSPITAL COURSE:  The patient was admitted and IV therapy was initiated. Initially she was started on clear liquid with no caffeine. She was given Dilaudid 2 mg IV q.3h.  p.r.n. for severe pain with Phenergan. She had a CT of the abdomen with contrast.  A central line was placed by Marnee Spring. Arkin, M.D., due to poor IV access.  Amylase and lipase by a urine specimen was done.  She was started on her medications of Flovent and Serevent for COPD with asthma. She was able to tolerate clear liquids and was advanced to a full liquid diet with a low fat diet.  She had a GI consult by Fayrene Fearing L. Edwards, M.D., who performed an endoscopy on her.  The patient had an episode of hypokalemia. Potassium supplements were given. She was also started on a nicotine patch. She had a pyloric channel ulcer with partial gastric outlet obstruction.  She was started on Protonix 40 mg IV q.12h.  The Pepcid was discontinued. She was also started on Remeron 15 mg p.o. q.a.m. and q.h.s.  The patient had dental pain and had a consult by dental services.  Reglan was added to her plan of care a.c. meals and at bedtime.  Cindra Eves, D.D.S., started the patient on Xylocaine Viscous.  Pain was controlled and Dilaudid was decreased and the patient was started on Vicodin.  Also her diet was advanced to a soft diet with no caffeinated beverages, low fat diet; however, she did not tolerate the diet and was changed to a full liquid diet and the Protonix was changed to the oral route 40 mg b.i.d.  Eventually the Dilaudid was discontinued; however, she started having severe pain and  was restarted and the Reglan was returned to the intravenous route.  The Protonix was changed to the intravenous route. Also the patient was started on OxyContin 10 mg q.a.m. and q.h.s. The patient had episodes of constipation. Fleets enema was given. Also the patient was started on Lactulose 30 cc q.a.m. and q.h.s.  Her OxyContin was increased to 20 mg q.a.m. and q.h.s.  Dilaudid was tapered.  The Nicoderm patch was decreased.  Her pain, nausea and vomiting improved.  The Reglan was able to be changed to p.o. route.   Her diet continued to advance to a soft diet with no caffeinated beverages and her Dilaudid was tapered and discontinued and she was given Vicodin for her pain.  DISCHARGE CONDITION:  The patient is discharged to home in stable condition without any vomiting and she was able to tolerate epigastric pain which had improved on p.o. medications.  DISCHARGE MEDICATIONS:  1. Phenergan 25 mg suppository insert one q.6h. p.r.n. for nausea.  2. Protonix 40 mg tablet one q.a.m. and q.h.s.  3. Reglan 10 mg tablets one a.c. and h.s.  4. OxyContin 20 mg tablet one q.a.m. and q.h.s.  5. Vicodin tablet one q.6h. p.r.n. for pain.  6. Remeron 15 mg tablet one q.h.s. for tension and rest.  7. Ativan 1 mg tablet one q.8h. for rest.  8. Tussionex suspension 5 cc q.12h. p.r.n. for cough.  9. Serevent metered dose inhaler two puffs q.a.m. and q.h.s. 10. Flovent metered dose inhaler two puffs q.a.m. and q.h.s. 11. Albuterol metered dose inhaler two puffs q.4h. p.r.n. for cough and     chest tightness. 12. Lactulose 30 cc take q.h.s. for stool softener.  DISCHARGE DIET:  Soft, low fat, no caffeinated beverages.  FOLLOW-UP:  Call office for appointment early January 2002. DD:  01/27/00 TD:  01/28/00 Job: [id number]

## 2010-05-22 NOTE — Discharge Summary (Signed)
[name], [name]                         ACCOUNT NO.:  [id number]   MEDICAL RECORD NO.:  [id number]                   PATIENT TYPE:  INP   LOCATION:  0357                                 FACILITY:  [facility]   PHYSICIAN:  Lorelle Formosa, M.D.           DATE OF BIRTH:  08-Aug-1950   DATE OF ADMISSION:  01/10/2002  DATE OF DISCHARGE:  01/13/2002                                 DISCHARGE SUMMARY   ADMISSION DIAGNOSES:  1. Abdominal pain.  2. History of ethanol abuse.  3. Anemia.  4. Hypokalemia.  5. Depression.  6. Seizure disorder.  7. Peptic ulcer disease.   DISCHARGE DIAGNOSES:  1. Abdominal pain apparently secondary to alcohol induced gastritis.  2. History of ethanol abuse.  3. Anemia.  4. Hypokalemia.  5. Depression.  6. Seizure disorder.  7. Peptic ulcer disease.   HISTORY:  The patient is a 60 year old Caucasian woman who presented to the  emergency room for complaints of vomiting seven times a day for the past  three days.  She states she had been back and forth to the emergency room  were she felt weak.  In the emergency room it was noted that her  electrolytes was basically normal except for low potassium but she was  admitted because of weakness.   PAST MEDICAL HISTORY:  Numerous hospitalizations and ER visits with nausea,  vomiting, pancreatitis, ethanol abuse.  Also peptic ulcer disease.  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   MEDICATIONS:  1. Thorazine.  2. Protonix 40 mg.  3. Phenergan.  4. Vicodin.   FAMILY HISTORY:  Positive for ethanol abuse in her brother, father.   REVIEW OF SYSTEMS:  Remarkable for weight loss and some shortness of breath  at times with pulmonary infections because of smoking.   SOCIAL HISTORY:  The patient is a heavy smoker.  She states no alcohol in  the past month.  She was married and has 2 children.   PHYSICAL EXAMINATION:  VITAL SIGNS:  Blood pressure 145/73, pulse 107,  respirations 24, temperature 98.2, pulse oximetry 97%.  GENERAL:  The patient appeared to be a tachypneic female.  HEENT:  Normocephalic with pupil wasting noted.  NECK:  Supple.  No jugular distention, bruits, nodes, or thyromegaly.  CHEST:  Wheezing diffusely most expiratory.  HEART:  Regular rhythm and rate with no thrills, heaves, or murmurs.  ABDOMEN:  Tender to palpation.  Positive bowel sounds were present.  Negative for masses and negative for organomegaly.  EXTREMITIES:  Normal.  She was able to ambulate.  RECTAL:  Negative by P.A. in emergency room.   LABORATORIES:  CBC reveals white count of 177, hemoglobin 9.3, hematocrit  28.7.  At repeat prior to discharge hemoglobin was 8.9 with hematocrit 27.7,  platelets 228,000, white count 6.2.  Electrolytes revealed sodium 3.1 with  correction to 5.0.  __________ basically normal.  Urine was yellow  and  cloudy with serum creatinine 1.028, pH of 6, and 30 gm of protein, trace  leukocyte esterase.  EKG had normal sinus rhythm.   HOSPITAL COURSE:  The patient was given IV fluids of normal saline 150  mL/hour with 40 mEq KCl per L and Zofran 4 mg q.6h.  IV fluids used:  D5 1/2-  normal saline 150 mL/hour with 40 mEq per L.  She was given handheld  nebulizer q.4h. and Singulair 10 mg daily.  She was transitioned to K-Dur 20  mEq two p.o. b.i.d. and __________ patch 20 mg daily.  She was transitioned  to oral medicines and given Phenergan p.o. 25 mg suppositories q.4h. for  nausea and Percocet for pain 5/325.  She is discharged for outpatient  management.   DISCHARGE MEDICATIONS:  1. Vicodin one to two p.o. q.4h. p.r.n. pain.  2. Protonix 40 mg daily.  3. Phenergan 25 mg q.4-6h. p.r.n. nausea/vomiting.  4. NicoDerm 21 mg patch daily.   DISCHARGE INSTRUCTIONS:  The patient was encouraged to give up smoking and  alcohol.                                               Lorelle Formosa, M.D.    WWM/MEDQ  D:  02/23/2002  T:   02/23/2002  Job:  [id number]

## 2010-05-22 NOTE — Discharge Summary (Signed)
NAMEARYN, SAFRAN                         ACCOUNT NO.:  [id number]   MEDICAL RECORD NO.:  [id number]                   PATIENT TYPE:  INP   LOCATION:  0471                                 FACILITY:  [facility]   PHYSICIAN:  Lorelle Formosa, M.D.           DATE OF BIRTH:  Apr 03, 1950   DATE OF ADMISSION:  09/21/2002  DATE OF DISCHARGE:  09/23/2002                                 DISCHARGE SUMMARY   ADMISSION DIAGNOSIS:  Severe abdominal pain with intractable nausea and  vomiting.   DISCHARGE DIAGNOSES:  1. Recurrent gastritis.  2. History of pancreatitis.  3. Asthma.  4. History of ethanol abuse.   CONDITION ON DISCHARGE:  Stable.   DISCHARGE DISPOSITION:  Follow up in my office in approximately one to two  weeks.   DISCHARGE MEDICATIONS:  1. Reglan before meals and h.s.  2. Protonix 40 mg daily.  3. Ultracet one or two q.i.d. p.r.n. pain.  4. The patient is also to use her albuterol meter dosed inhaler p.r.n.   HISTORY:  This patient is a 60 year old married Caucasian woman who  presented to the emergency room with a flare up of nausea, vomiting, and  abdominal pain.  She was assessed by the emergency department physician and  referred to Dr. Renaye Rakers who was the on call physician.  She was  subsequently admitted.  The patient has been admitted numerous times for  recurrent episodes.  She has also been admitted numerous times to the  emergency room and returned back home with __________ medications after IV  fluids and medicines for nausea.  She has a chronic history of ethanol abuse  which she has apparently stopped by history, but still does use significant  cigarettes.  She has been a difficult management case.   Past medical history, family history, personal history, social history, and  review of systems are outlined in the admission history and physical.   PHYSICAL EXAMINATION:  VITAL SIGNS:  Blood pressure 119/78, pulse 110,  respirations 20, temperature  98.6.  GENERAL:  The patient appeared in moderate discomfort.  HEENT:  PERRLA.  She was rather thin framed, arousable, Caucasian woman.  NECK:  Supple.  CHEST:  Clear to auscultation.  HEART:  Regular rate and rhythm with no murmur.  ABDOMEN:  Soft, tenderness to palpation.  EXTREMITIES:  No edema.  NEUROLOGIC:  Grossly physiologic.   LABORATORY DATA:  CBC revealed a white blood cell count of 12.1, hemoglobin  10.1, hematocrit 32.3, platelets 324,000, with 69 neutrophils, 22 lymphs,  and 9 monocytes.  White blood cell morphology revealed segmented neutrophils  with hypersegmented neutrophils.  Red blood cell morphology revealed  __________, target cells, and tear drop cells.  Chemistries revealed a  potassium of 3.4 which was corrected to 3.7, glucose 102 which was repeat at  82.  Liver functions were normal.  Urinalysis was cloudy with specific  gravity of 1.028, pH of 6.  She had trace ketones.  Acute abdominal x-ray  series revealed normal bowel and gas pattern and no evidence on plain film  of perforated viscus.  There were changes of COPD.   HOSPITAL COURSE:  The patient was admitted to the hospital and given IV  fluids of D5 1/2 normal saline with 4 mEq of KCL per liter.  She was given  Protonix IV 40 mg and Phenergan 25 mg q.4-6h.  Her diet was advanced the  next day as tolerated, and she was more comfortable.  She thus gradually  improved.  She was thus discharged for outpatient management.                                               Lorelle Formosa, M.D.    WWM/MEDQ  D:  10/18/2002  T:  10/18/2002  Job:  [id number]

## 2010-06-12 ENCOUNTER — Inpatient Hospital Stay (HOSPITAL_COMMUNITY)
Admission: EM | Admit: 2010-06-12 | Discharge: 2010-06-16 | DRG: 439 | Disposition: A | Discharge status: Home / Self Care | Payer: Medicaid Other | Attending: Internal Medicine | Admitting: Internal Medicine

## 2010-06-12 DIAGNOSIS — R109 Unspecified abdominal pain: Secondary | ICD-10-CM | POA: Diagnosis present

## 2010-06-12 DIAGNOSIS — K862 Cyst of pancreas: Secondary | ICD-10-CM | POA: Diagnosis present

## 2010-06-12 DIAGNOSIS — F10239 Alcohol dependence with withdrawal, unspecified: Secondary | ICD-10-CM | POA: Diagnosis present

## 2010-06-12 DIAGNOSIS — F1020 Alcohol dependence, uncomplicated: Secondary | ICD-10-CM | POA: Diagnosis present

## 2010-06-12 DIAGNOSIS — Z85528 Personal history of other malignant neoplasm of kidney: Secondary | ICD-10-CM

## 2010-06-12 DIAGNOSIS — F17200 Nicotine dependence, unspecified, uncomplicated: Secondary | ICD-10-CM | POA: Diagnosis present

## 2010-06-12 DIAGNOSIS — J441 Chronic obstructive pulmonary disease with (acute) exacerbation: Secondary | ICD-10-CM | POA: Diagnosis present

## 2010-06-12 DIAGNOSIS — F1010 Alcohol abuse, uncomplicated: Secondary | ICD-10-CM | POA: Diagnosis present

## 2010-06-12 DIAGNOSIS — R569 Unspecified convulsions: Secondary | ICD-10-CM | POA: Diagnosis present

## 2010-06-12 DIAGNOSIS — K861 Other chronic pancreatitis: Secondary | ICD-10-CM | POA: Diagnosis present

## 2010-06-12 DIAGNOSIS — G894 Chronic pain syndrome: Secondary | ICD-10-CM | POA: Diagnosis present

## 2010-06-12 DIAGNOSIS — K859 Acute pancreatitis without necrosis or infection, unspecified: Principal | ICD-10-CM | POA: Diagnosis present

## 2010-06-12 DIAGNOSIS — K7460 Unspecified cirrhosis of liver: Secondary | ICD-10-CM | POA: Diagnosis present

## 2010-06-12 DIAGNOSIS — F10939 Alcohol use, unspecified with withdrawal, unspecified: Secondary | ICD-10-CM | POA: Diagnosis present

## 2010-06-12 LAB — DIFFERENTIAL
Basophils Relative: 1 % (ref 0–1)
Eosinophils Relative: 4 % (ref 0–5)
Lymphocytes Relative: 34 % (ref 12–46)
Monocytes Relative: 10 % (ref 3–12)
Neutrophils Relative %: 51 % (ref 43–77)

## 2010-06-12 LAB — COMPREHENSIVE METABOLIC PANEL
ALT: 17 U/L (ref 0–35)
AST: 32 U/L (ref 0–37)
Calcium: 9.4 mg/dL (ref 8.4–10.5)
Potassium: 3.8 mEq/L (ref 3.5–5.1)
Sodium: 135 mEq/L (ref 135–145)
Total Protein: 7 g/dL (ref 6.0–8.3)

## 2010-06-12 LAB — URINALYSIS, ROUTINE W REFLEX MICROSCOPIC
Bilirubin Urine: NEGATIVE
Glucose, UA: NEGATIVE mg/dL
Leukocytes, UA: NEGATIVE
Nitrite: NEGATIVE
Specific Gravity, Urine: 1.007 (ref 1.005–1.030)
pH: 6 (ref 5.0–8.0)

## 2010-06-12 LAB — CBC
HCT: 39.3 % (ref 36.0–46.0)
Hemoglobin: 13.4 g/dL (ref 12.0–15.0)
MCV: 96.8 fL (ref 78.0–100.0)
Platelets: 119 10*3/uL — ABNORMAL LOW (ref 150–400)
RBC: 4.06 MIL/uL (ref 3.87–5.11)
WBC: 4.8 10*3/uL (ref 4.0–10.5)

## 2010-06-12 LAB — URINE MICROSCOPIC-ADD ON

## 2010-06-13 ENCOUNTER — Emergency Department (HOSPITAL_COMMUNITY): Payer: Medicaid Other

## 2010-06-13 ENCOUNTER — Inpatient Hospital Stay (HOSPITAL_COMMUNITY): Payer: Medicaid Other

## 2010-06-13 LAB — RAPID URINE DRUG SCREEN, HOSP PERFORMED
Amphetamines: NOT DETECTED
Barbiturates: NOT DETECTED
Benzodiazepines: POSITIVE — AB

## 2010-06-14 LAB — CBC
HCT: 37 % (ref 36.0–46.0)
Hemoglobin: 12.4 g/dL (ref 12.0–15.0)
MCV: 97.6 fL (ref 78.0–100.0)
RBC: 3.79 MIL/uL — ABNORMAL LOW (ref 3.87–5.11)
WBC: 3.5 10*3/uL — ABNORMAL LOW (ref 4.0–10.5)

## 2010-06-14 LAB — MAGNESIUM: Magnesium: 1.9 mg/dL (ref 1.5–2.5)

## 2010-06-14 LAB — COMPREHENSIVE METABOLIC PANEL
ALT: 15 U/L (ref 0–35)
Calcium: 9.6 mg/dL (ref 8.4–10.5)
GFR calc Af Amer: >60 mL/min (ref >60)
Glucose, Bld: 69 mg/dL — ABNORMAL LOW (ref 70–99)
Sodium: 135 mEq/L (ref 135–145)
Total Protein: 6.2 g/dL (ref 6.0–8.3)

## 2010-06-14 LAB — DIFFERENTIAL
Lymphocytes Relative: 28 % (ref 12–46)
Lymphs Abs: 1 10*3/uL (ref 0.7–4.0)
Neutrophils Relative %: 55 % (ref 43–77)

## 2010-06-14 LAB — APTT: aPTT: 34 seconds (ref 24–37)

## 2010-06-14 NOTE — H&P (Signed)
[name], [name]               ACCOUNT NO.:  [id number]  MEDICAL RECORD NO.:  [id number]  LOCATION:  WLED                         FACILITY[facility]  PHYSICIAN:  Michiel Cowboy, MDDATE OF BIRTH:  [birth year]  DATE OF ADMISSION:  06/12/2010 DATE OF DISCHARGE:                             HISTORY & PHYSICAL   PRIMARY CARE PROVIDER:  Lorelle Formosa, MD  CHIEF COMPLAINT:  Abdominal pain.  HISTORY OF PRESENT ILLNESS:  The patient is 60 year old female with history of chronic pain syndrome, history of chronic pancreatitis, history of polysubstance abuse and alcohol abuse.  The patient reports that yesterday, she developed significant abdominal pain, which was epigastric.  She did not endorse nausea or vomiting.  No fevers or chills, chest pain, or shortness of breath.  At the end of my discussion with her, she did mention that what about the seizures that she had yesterday turned out to be that yesterday while she was drinking a beer with her husband, she developed shaking all over with stuff coming out of her mouth type of episode, which has been thought might be of a seizure.  She did not seek medical attention.  She is not quite clear about the history, but states she cannot remember what happened, but her husband told what happened.  Again, I think this may be the first time she ever mentions this to anybody.  I do not have any history of seizures on her in the past.  She states that this was her first alcoholic drink after 6 months of sobriety.  Her alcohol level was still at 176 today.  PAST MEDICAL HISTORY:  Significant for: 1. Anxiety. 2. COPD. 3. Chronic pain syndrome. 4. Chronic cirrhosis of the liver. 5. Chronic pancreatitis. 6. GERD. 7. Polysubstance abuse. 8. Tobacco abuse. 9. Heavy alcohol abuse. 10.Of note, the patient has a history of renal cancer treated recently     by cryotherapy about 2 weeks ago.  REVIEW OF SYSTEMS:  Otherwise, her review of  systems is difficult to obtain as the patient is perseverating about epigastric abdominal pain and pain symptoms overall.  SOCIAL HISTORY:  The patient has a history of tobacco abuse.  She still smokes about a pack a day.  She states that she can no longer drink heavily, but has a history of alcohol withdrawals in the past.  Given her current still elevated alcohol level, her history is likely nonconsistent.  She also denies any drug abuse.  FAMILY HISTORY:  Noncontributory.  ALLERGIES: 1. ASPIRIN. 2. TERAZOSIN.  MEDICATIONS:  She does endorse taking Dilaudid 4 mg 4 times a day as needed for pain and lorazepam 1 mg 3 times a day as needed for anxiety. At some point, as mentioned she might be taking Prozac or Zofran, which she is not actually endorsing, now she is mainly concentrated on Dilaudid and lorazepam she takes.  PHYSICAL EXAMINATION:  VITAL SIGNS:  Temperature 98.1, blood pressure 103/69, pulse 93, respirations 27, and 96% on room air. GENERAL:  The patient appears to be in no acute distress. HEENT:  Head is nontraumatic.  Moist mucous membranes. LUNGS:  With occasional wheezes throughout. HEART:  Regular rhythm.  No  murmurs appreciated. ABDOMEN.  There is a vertical scar present.  Abdomen appears to be soft when the patient is not straining.  According to the patient, this is diffusely tender. LOWER EXTREMITIES:  Without clubbing, cyanosis, or edema. NEUROLOGICAL:  Grossly intact.  When the patient is not being questioned, she is actually resting comfortably.  I had to wake her up to get her history.  LABORATORY DATA:  White blood cell count 4.8, hemoglobin 13.4.  Sodium 135, potassium 3.8, creatinine 0.74.  Total bilirubin 0.2, AST and ALT are in normal limits, total protein 7.0, albumin 3.7, lipase 74. Alcohol level 176.  UA showing a large amount of blood.  ASSESSMENT/PLAN:  This is a 60 year old female with history of chronic pain syndrome and known history of  renal cancer presents with epigastric pain.  This may be related to her chronic pancreatitis. 1. Chronic pancreatitis.  We will give IV fluids, give ice chips only,     bowel rest, and give pain control as much as possible.  Of note,     the patient has been on heavy doses of narcotics for prolonged     period of time and likely developed tolerance, it may be nearly     impossible to achieve complete pain control without oversedation. 2. History of alcohol abuse.  We will put her on CIWA protocol, watch     for signs withdrawal, continue Ativan p.r.n., and give thiamine. 3. History of renal cancer.  If her pain persists, consider further     imaging of abdomen, but it seems like at this point, this is more     of an epigastric pain. 3. Possible seizure - this seems atypical but will obtain Ct of the head and EEG. 4. History of tobacco abuse.  We will write for tobacco cessation     consult as well as nicotine patch. 5. Prophylaxis.  Protonix and SCDs.  I have spent a total of 50 minutes on this admission.     Michiel Cowboy, MD     AVD/MEDQ  D:  06/13/2010  T:  06/13/2010  Job:  [id number]  cc:   Lorelle Formosa, M.D.Fax: [phone]  Electronically Signed by Therisa Doyne MD on 06/14/2010 02:45:38 AM

## 2010-06-15 ENCOUNTER — Other Ambulatory Visit (HOSPITAL_COMMUNITY): Payer: Medicaid Other

## 2010-06-15 ENCOUNTER — Inpatient Hospital Stay (HOSPITAL_COMMUNITY): Payer: Medicaid Other

## 2010-06-16 ENCOUNTER — Other Ambulatory Visit (HOSPITAL_COMMUNITY): Payer: Medicaid Other

## 2010-06-17 ENCOUNTER — Other Ambulatory Visit (HOSPITAL_COMMUNITY): Payer: Medicaid Other

## 2010-06-29 ENCOUNTER — Emergency Department (HOSPITAL_COMMUNITY)
Admission: EM | Admit: 2010-06-29 | Discharge: 2010-06-29 | Disposition: A | Discharge status: Home / Self Care | Payer: Medicaid Other | Attending: Emergency Medicine | Admitting: Emergency Medicine

## 2010-06-29 DIAGNOSIS — R109 Unspecified abdominal pain: Secondary | ICD-10-CM | POA: Insufficient documentation

## 2010-06-29 DIAGNOSIS — Z008 Encounter for other general examination: Secondary | ICD-10-CM | POA: Insufficient documentation

## 2010-07-21 ENCOUNTER — Emergency Department (HOSPITAL_COMMUNITY)
Admission: EM | Admit: 2010-07-21 | Discharge: 2010-07-21 | Disposition: A | Discharge status: Home / Self Care | Payer: Medicaid Other | Attending: Emergency Medicine | Admitting: Emergency Medicine

## 2010-07-21 DIAGNOSIS — R109 Unspecified abdominal pain: Secondary | ICD-10-CM | POA: Insufficient documentation

## 2010-07-21 DIAGNOSIS — F341 Dysthymic disorder: Secondary | ICD-10-CM | POA: Insufficient documentation

## 2010-07-21 DIAGNOSIS — G8929 Other chronic pain: Secondary | ICD-10-CM | POA: Insufficient documentation

## 2010-07-21 DIAGNOSIS — R10816 Epigastric abdominal tenderness: Secondary | ICD-10-CM | POA: Insufficient documentation

## 2010-07-21 LAB — LIPASE, BLOOD: Lipase: 38 U/L (ref 11–59)

## 2010-07-21 LAB — DIFFERENTIAL
Basophils Absolute: 0 10*3/uL (ref 0.0–0.1)
Basophils Relative: 1 % (ref 0–1)
Lymphocytes Relative: 14 % (ref 12–46)
Monocytes Relative: 5 % (ref 3–12)
Neutro Abs: 2.9 10*3/uL (ref 1.7–7.7)

## 2010-07-21 LAB — COMPREHENSIVE METABOLIC PANEL
Alkaline Phosphatase: 131 U/L — ABNORMAL HIGH (ref 39–117)
BUN: 14 mg/dL (ref 6–23)
GFR calc Af Amer: >60 mL/min (ref >60)
GFR calc non Af Amer: >60 mL/min (ref >60)
Glucose, Bld: 126 mg/dL — ABNORMAL HIGH (ref 70–99)
Potassium: 5.1 mEq/L (ref 3.5–5.1)
Total Protein: 8.6 g/dL — ABNORMAL HIGH (ref 6.0–8.3)

## 2010-07-21 LAB — CBC
HCT: 44.7 % (ref 36.0–46.0)
Hemoglobin: 15.5 g/dL — ABNORMAL HIGH (ref 12.0–15.0)
MCH: 33 pg (ref 26.0–34.0)
MCHC: 34.7 g/dL (ref 30.0–36.0)

## 2010-07-27 IMAGING — CT CT ABDOMEN W/O CM
3 of 5 series · 12 of 46 positions shown, 17 images · non-contrast
Comparison: 05/02/2007 and 08/22/2007

CLINICAL DATA: The patient is status post percutaneous
radiofrequency ablation of a left renal cell carcinoma on
01/18/2007.  1-year follow-up imaging is performed.  CT of the
abdomen was performed for abdominal pain on 08/22/2007.  This was
not performed, however, with unenhanced and dynamic scanning to
evaluate the ablation site adequately.

CT ABDOMEN WITHOUT CONTRAST
TECHNIQUE: Multidetector CT imaging of the abdomen was performed
following the standard protocol without IV contrast.

[Series 4: venous 70 sec 5.0 b40f · axial · portal-venous · 0.61mm/px · z∈[-228,-72]mm · 8 of 41 slices shown, 13 images]
[im 5/41  soft-tissue]
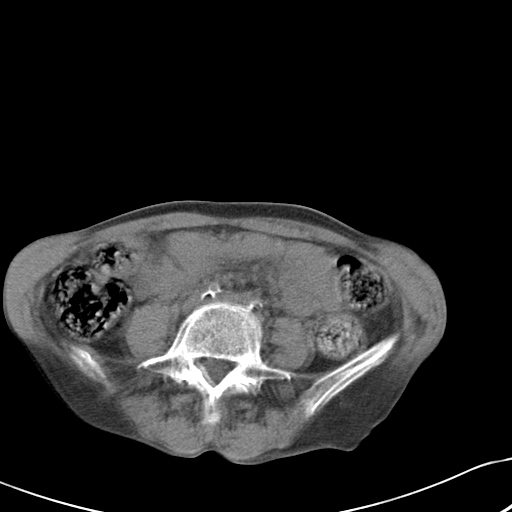
[im 5/41  bone]
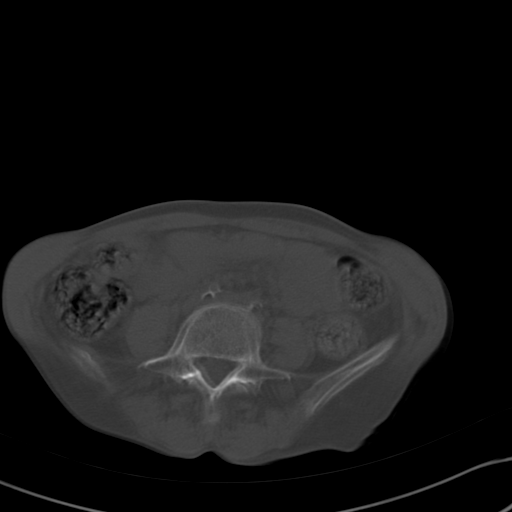
[im 9/41  soft-tissue]
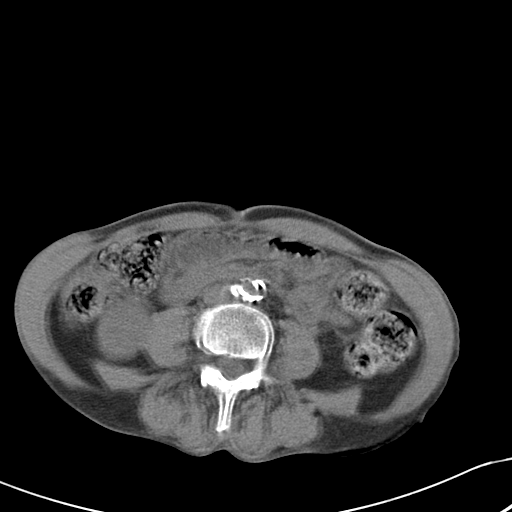
[im 14/41  soft-tissue]
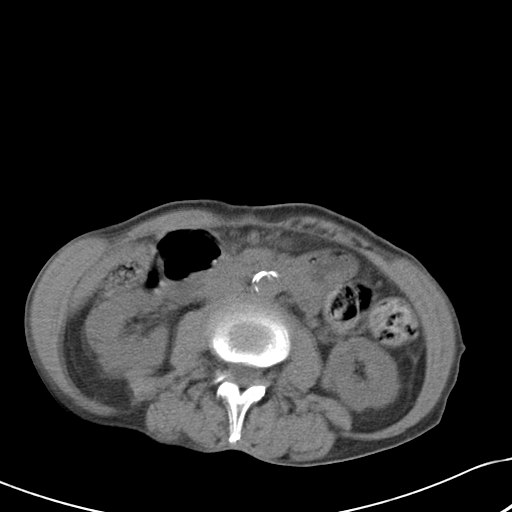
[im 18/41  soft-tissue]
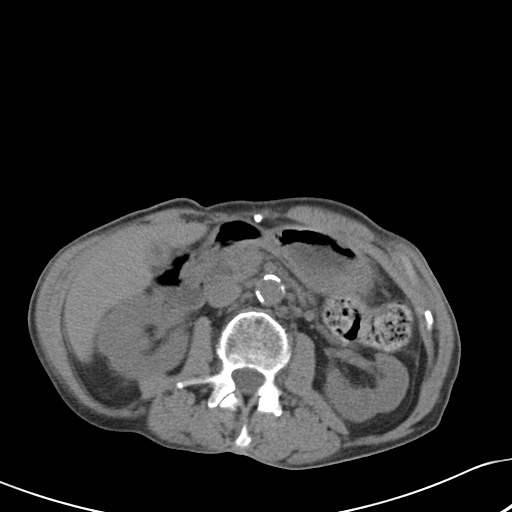
[im 23/41  soft-tissue]
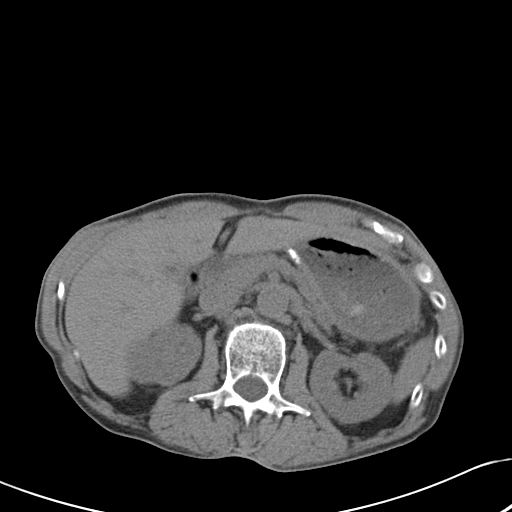
[im 23/41  lung]
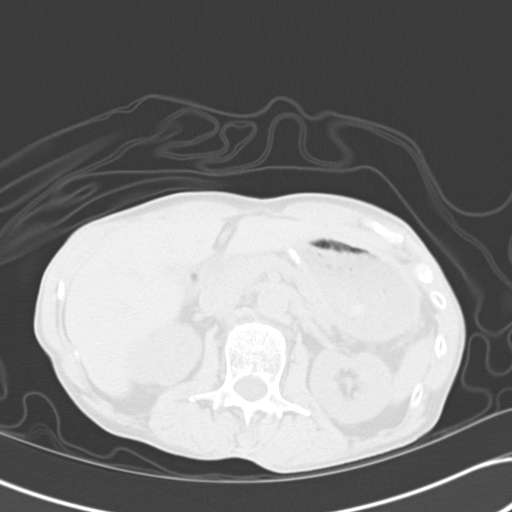
[im 27/41  soft-tissue]
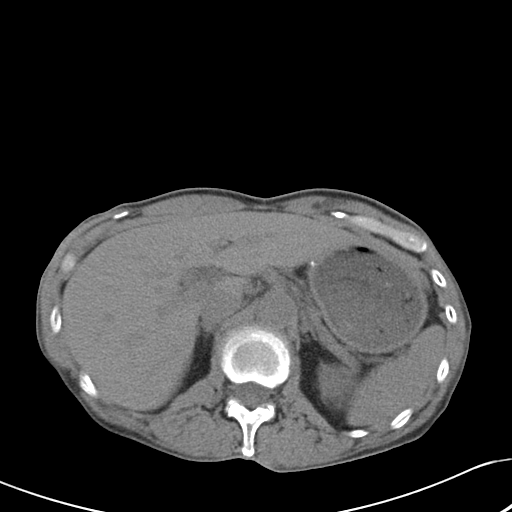
[im 27/41  lung]
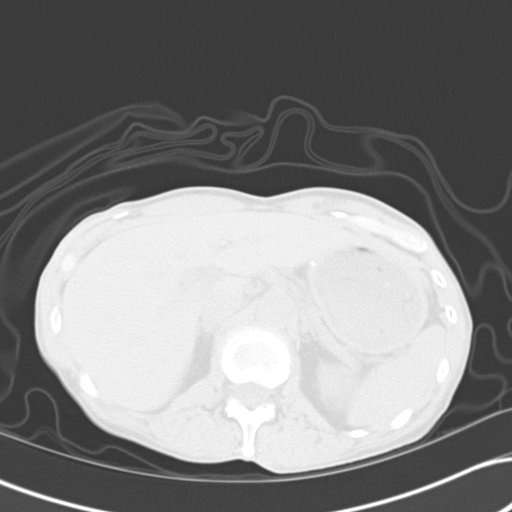
[im 32/41  soft-tissue]
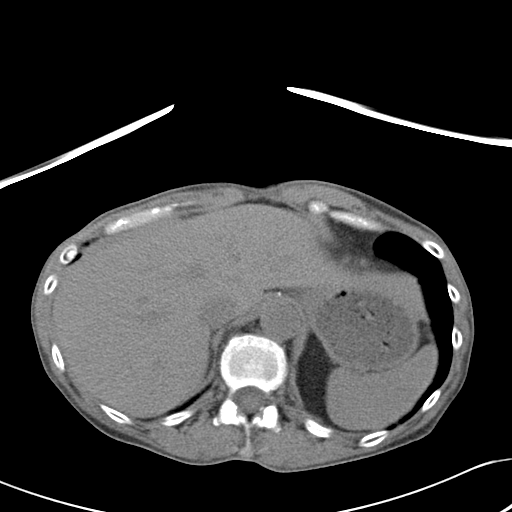
[im 32/41  lung]
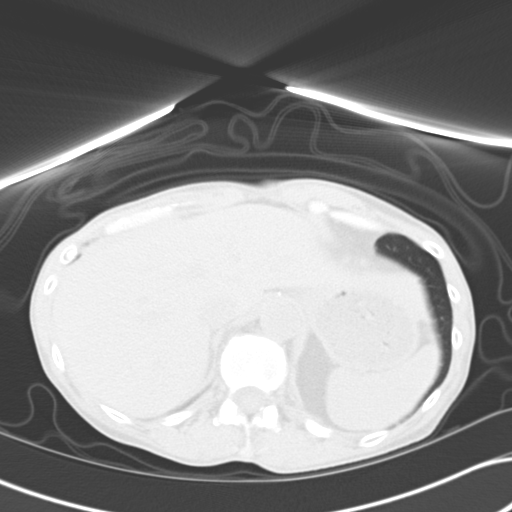
[im 36/41  soft-tissue]
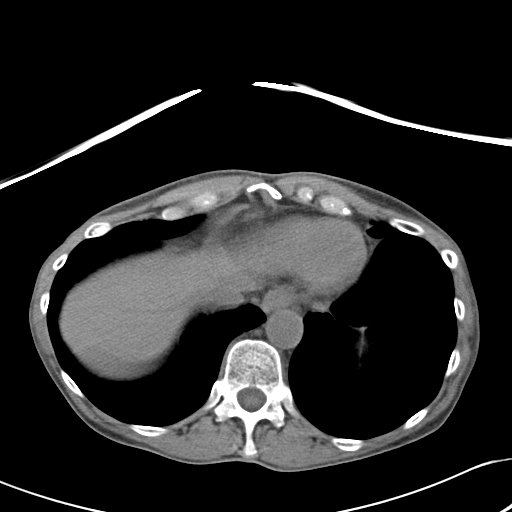
[im 36/41  lung]
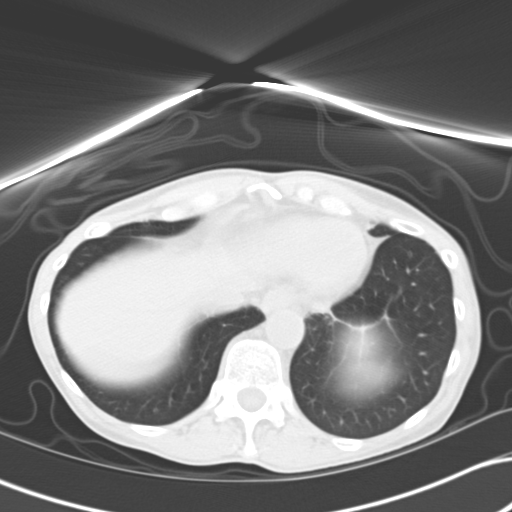

[Series 602: coronal · coronal · 0.61mm/px · 3 of 62 slices shown]
[im 21/62  soft-tissue]
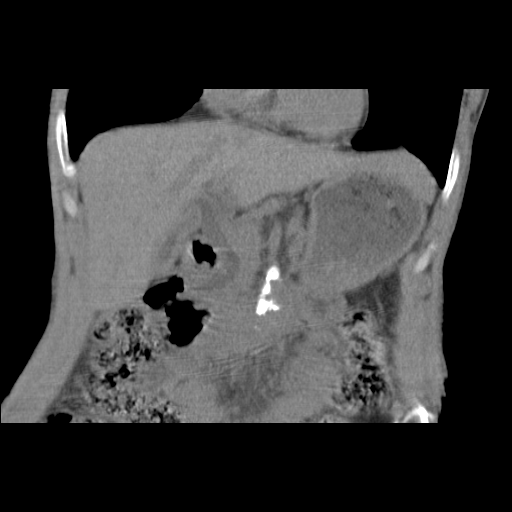
[im 28/62  soft-tissue]
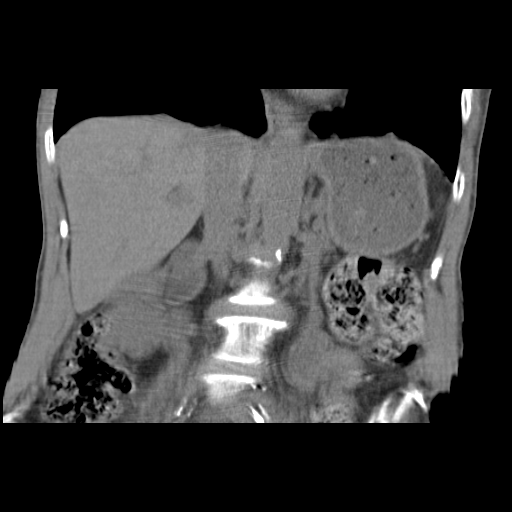
[im 34/62  soft-tissue]
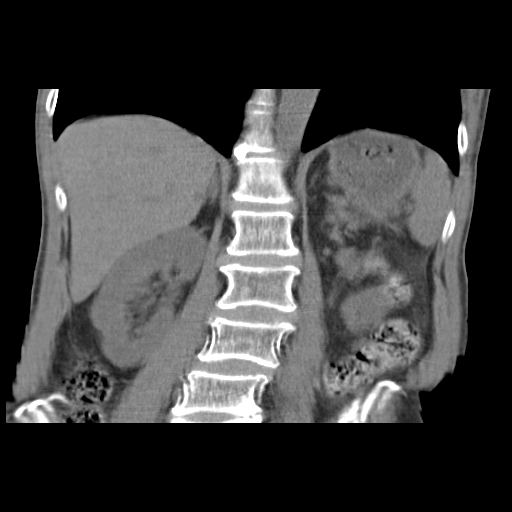

[Series 603: sagittal · sagittal · 0.61mm/px · 1 of 90 slices shown]
[im 30/90  soft-tissue]
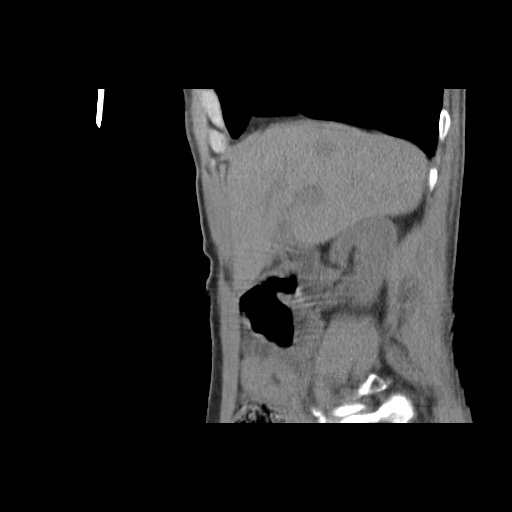

[12 of 46 positions shown; findings below may reference images not displayed]

FINDINGS: The patient was originally scheduled to have a dynamic
contrast enhanced scan.  After initial unenhanced scans, contrast
injection was performed via a functioning IV access of the left
upper extremity.  During contrast injection, there was
extravasation of 125 ml Tmnipaque-UMM IV contrast into the
subcutaneous tissues of the left upper arm.  This was treated with
appropriate compression and ice.  Due to the significant
infiltration, decision was made not to proceed with attempt at
further IV access today.   A follow-up contrast-enhanced study will
be performed at a later date.

By unenhanced CT, the ablation defect of the medial lower pole of
the left kidney shows decrease in size since prior CT.  Current
dimensions are approximately 1.4 x 2.4 x 1.7 cm.  The posterior and
inferior aspect of the defect continues to demonstrate high
internal density relative to a lower density portion anteriorly.

There is no evidence of hydronephrosis or abnormal fluid
collection.  No enlarged lymph nodes are identified.  The bowel
loops are normal caliber.  No abnormal calcifications are seen.
IMPRESSION: Decrease in size of the ablation defect at the level of treated
left lower pole renal cell carcinoma.  The defect continues to show
higher density in its posterior and inferior aspects.  A follow-up
contrast-enhanced CT will be performed after PICC line placement
and following further observation of the left upper extremity due
to significant infiltration with subcutaneous contrast
extravasation today.

## 2010-08-02 ENCOUNTER — Emergency Department (INDEPENDENT_AMBULATORY_CARE_PROVIDER_SITE_OTHER): Payer: Medicaid Other

## 2010-08-02 ENCOUNTER — Encounter (HOSPITAL_BASED_OUTPATIENT_CLINIC_OR_DEPARTMENT_OTHER): Payer: Self-pay | Admitting: Emergency Medicine

## 2010-08-02 ENCOUNTER — Emergency Department (HOSPITAL_BASED_OUTPATIENT_CLINIC_OR_DEPARTMENT_OTHER)
Admission: EM | Admit: 2010-08-02 | Discharge: 2010-08-02 | Disposition: A | Discharge status: Home / Self Care | Payer: Medicaid Other | Attending: Emergency Medicine | Admitting: Emergency Medicine

## 2010-08-02 DIAGNOSIS — J449 Chronic obstructive pulmonary disease, unspecified: Secondary | ICD-10-CM | POA: Insufficient documentation

## 2010-08-02 DIAGNOSIS — W19XXXA Unspecified fall, initial encounter: Secondary | ICD-10-CM

## 2010-08-02 DIAGNOSIS — S2239XA Fracture of one rib, unspecified side, initial encounter for closed fracture: Secondary | ICD-10-CM | POA: Insufficient documentation

## 2010-08-02 DIAGNOSIS — F17200 Nicotine dependence, unspecified, uncomplicated: Secondary | ICD-10-CM | POA: Insufficient documentation

## 2010-08-02 DIAGNOSIS — Y92009 Unspecified place in unspecified non-institutional (private) residence as the place of occurrence of the external cause: Secondary | ICD-10-CM | POA: Insufficient documentation

## 2010-08-02 DIAGNOSIS — S2249XA Multiple fractures of ribs, unspecified side, initial encounter for closed fracture: Secondary | ICD-10-CM

## 2010-08-02 DIAGNOSIS — W010XXA Fall on same level from slipping, tripping and stumbling without subsequent striking against object, initial encounter: Secondary | ICD-10-CM | POA: Insufficient documentation

## 2010-08-02 DIAGNOSIS — J4489 Other specified chronic obstructive pulmonary disease: Secondary | ICD-10-CM | POA: Insufficient documentation

## 2010-08-02 HISTORY — DX: Malignant (primary) neoplasm, unspecified: C80.1

## 2010-08-02 HISTORY — DX: Other psychoactive substance abuse, uncomplicated: F19.10

## 2010-08-02 HISTORY — DX: Chronic obstructive pulmonary disease, unspecified: J44.9

## 2010-08-02 HISTORY — DX: Acute pancreatitis without necrosis or infection, unspecified: K85.90

## 2010-08-02 MED ORDER — ONDANSETRON 8 MG PO TBDP
ORAL_TABLET | ORAL | Status: AC
  Administered 2010-08-02: 8 mg via ORAL
  Filled 2010-08-02: qty 1

## 2010-08-02 MED ORDER — OXYCODONE-ACETAMINOPHEN 5-325 MG PO TABS
2.0000 | ORAL_TABLET | Freq: Once | ORAL | Status: AC
  Administered 2010-08-02: 2 via ORAL
  Filled 2010-08-02 (×2): qty 2

## 2010-08-02 NOTE — ED Provider Notes (Signed)
Medical screening examination/treatment/procedure(s) were performed by non-physician practitioner and as supervising physician I was immediately available for consultation/collaboration.   Glynn Octave, MD 08/02/10 [phone]

## 2010-08-02 NOTE — ED Notes (Signed)
Pt fell at home, c/o left side/rib pain.  Pt smells of ETOH.  VSS

## 2010-08-02 NOTE — ED Notes (Signed)
Pt being discharged. States that she has someone coming to take her home. Pt came in via EMS after falling, smells of ETOH

## 2010-08-02 NOTE — ED Provider Notes (Signed)
History     Chief Complaint  Patient presents with  . Fall  . Rib Injury   HPI Comments: Pt states that she slipped because of her socks:no loc or dizziness associated with fall  Patient is a 60 y.o. female presenting with fall. The history is provided by the patient. No language interpreter was used.  Fall The accident occurred 3 to 5 hours ago. The fall occurred while walking. She landed on carpet. There was no blood loss. Pain location: left ribs. The pain is at a severity of 10/10. The pain is severe. She was ambulatory at the scene. There was no entrapment after the fall. There was no drug use involved in the accident. There was alcohol use involved in the accident. Pertinent negatives include no visual change, no fever, no numbness, no abdominal pain, no nausea, no vomiting, no headaches and no loss of consciousness. The symptoms are aggravated by activity. She has tried nothing for the symptoms.    Past Medical History  Diagnosis Date  . Cancer   . Pancreatitis   . COPD (chronic obstructive pulmonary disease)   . Substance abuse     History reviewed. No pertinent past surgical history.  History reviewed. No pertinent family history.  History  Substance Use Topics  . Smoking status: Current Everyday Smoker  . Smokeless tobacco: Not on file  . Alcohol Use: Yes     Hx of Alcohol abuse    OB History    Grav Para Term Preterm Abortions TAB SAB Ect Mult Living                  Review of Systems  Constitutional: Negative for fever.  Gastrointestinal: Negative for nausea, vomiting and abdominal pain.  Neurological: Negative for loss of consciousness, numbness and headaches.  All other systems reviewed and are negative.    Physical Exam  BP 130/93  Pulse 81  Temp(Src) 97.9 F (36.6 C) (Oral)  Resp 20  Ht 5\' 4"  (1.626 m)  Wt 80 lb (36.288 kg)  BMI 13.73 kg/m2  SpO2 98%  Physical Exam  Nursing note and vitals reviewed. Constitutional: She appears  well-developed and well-nourished.  Neck: Normal range of motion. Neck supple.  Cardiovascular: Normal rate and regular rhythm.   Pulmonary/Chest: She exhibits tenderness.       Pt tender along the left ribs  Abdominal: Soft.  Neurological: She is alert.  Skin: Skin is warm and dry.  Psychiatric: She has a normal mood and affect.    ED Course  Procedures   Dg Ribs Unilateral W/chest Left  08/02/2010  *RADIOLOGY REPORT*  Clinical Data: Left rib pain secondary to a fall.  LEFT RIBS AND CHEST - 3+ VIEW  Comparison: 06/13/2010  Findings: There are fractures of the posterior lateral aspects of the left ninth and tenth ribs.  No pneumothorax or lung contusion or pleural effusion.  Heart size and vascularity are normal.  IMPRESSION: Fractures of the posterior lateral aspects of the left ninth and tenth ribs.  Original Report Authenticated By: Gwynn Burly, M.D.    MDM Pt has pain medication at home pt in no acute distress:pt is okay to go home      Teressa Lower, NP 08/02/10 1906

## 2010-08-03 NOTE — Discharge Summary (Signed)
[name], [name]               ACCOUNT NO.:  [id number]  MEDICAL RECORD NO.:  [id number]  LOCATION:  1423                         FACILITY:  [facility]  PHYSICIAN:  Janice Lover Bosie Helper, [name]      DATE OF BIRTH:  01/20/1950  DATE OF ADMISSION:  06/12/2010 DATE OF DISCHARGE:  06/16/2010                              DISCHARGE SUMMARY   PRIMARY CARE PHYSICIAN:  Janice Formosa, [name]  DISCHARGE DIAGNOSES: 1. Acute-on-chronic abdominal pain. 2. Acute-on-chronic pancreatitis. 3. History of chronic pain syndrome. 4. Cystic lesion on the pancreatic tail likely from alcohol abuse,     need to repeat a CT abdomen and pelvis with IV contrast as     outpatient within the next 1 month.  The patient informed. 5. History of liver cirrhosis. 6. History of renal cell cancer status post ablation. 7. Chronic obstructive pulmonary disease with exacerbation. 8. Alcohol withdrawal. 9. Ongoing tobacco and alcohol abuse.  PROCEDURE: 1. CT head without contrast:  Atrophy, no acute finding. 2. Chest x-ray:  Emphysema without acute disease. 3. CT abdomen and pelvis:  Postprocedure change of prior ablation of     the left inferior pole renal cell cancer.  Large stool burden.     Cystic lesion in the tail of pancreas poorly visualized secondary     to noncontrast.  Surgical staple in the wall of the stomach and     clip in the upper abdomen consistent with prior gastric surgery.  HISTORY OF PRESENT ILLNESS:  This is a 60 year old female with history of chronic pain syndrome, multiple pain medication on narcotic, history of chronic pancreatitis and history of polysubstance abuse, alcohol abuse, presented with abdominal pain and epigastric pain.  The patient also admitted, she likely had a seizure while she was drinking beer with her husband.  She developed shaking allover with stuff coming out of her mouth type of episode.  She did  not seek medical attention.  She was not quite clear about the history, but  she cannot recall what exactly happened.  No history of seizure in the past.  Found to have alcohol level of 176.  The patient admitted with alcohol intoxication and alcohol withdrawal and found to have high amylase and lipase level. 1. Alcohol withdrawal.  The patient kept on Ativan protocol.  No     further seizure and was felt the patient's symptoms are likely     related to alcohol drinking. 2. COPD with exacerbation.  The patient started on prednisone, which     will be tapered and nebulizer treatment. 3. Acute-on-chronic pancreatitis.  The patient has a CT scan, which     showed just a cystic lesion in the tail of pancreas, but poorly     visualized secondary to noncontrast technique, likely from heavy     drinking.  Currently, this needs to be followed up with another CT     scan with IV contrast at [name] office for further     clarification.  The patient's symptoms completely resolved.  The     patient able to eat 100% of her meal.  The patient has chronic pain     and she  is on pain medication.  At this time, the patient's pain     felt to be secondary to chronic pancreatitis and chronic abdominal     pain.  Her lipase and amylase reversed to normal.  The patient     advised about quit smoking and quit drinking and the patient fully     understands.  DISCHARGE MEDICATIONS: 1. Dilaudid 4 mg p.o. q.4 h. as needed. 2. Lorazepam 1 mg p.o. 4 times daily as needed. 3. Nexium 4 mg daily. 4. Phenergan 12.5 mg ever 6 hours as needed. 5. Thiamine 100 mg p.o. daily. 6. Folic acid 1 mg p.o. daily. 7. Albuterol neb q.4 h. as needed. 8. Prednisone 20 mg p.o. tapered dose within 1 week.  DISCHARGE INSTRUCTIONS:  The patient will be discharged home with low- fat diet and activity as tolerated.  The patient was advised to follow up with [name] to repeat another CT abdomen and pelvis with IV contrast and further evaluation of the cystic lesion.  The patient fully aware.  The  patient will also follow up with her [name] next week.     Janice Iddings Bosie Helper, [name]     HIE/MEDQ  D:  06/16/2010  T:  06/16/2010  Job:  [id number]  cc:   [name], M.D. Fax: [phone]  Electronically Signed by [name] on 08/03/2010 02:42:58 PM

## 2010-08-05 ENCOUNTER — Emergency Department (HOSPITAL_COMMUNITY)
Admission: EM | Admit: 2010-08-05 | Discharge: 2010-08-05 | Disposition: A | Discharge status: Home / Self Care | Payer: Medicaid Other | Attending: Emergency Medicine | Admitting: Emergency Medicine

## 2010-08-05 ENCOUNTER — Emergency Department (HOSPITAL_COMMUNITY): Payer: Medicaid Other

## 2010-08-05 DIAGNOSIS — C259 Malignant neoplasm of pancreas, unspecified: Secondary | ICD-10-CM | POA: Insufficient documentation

## 2010-08-05 DIAGNOSIS — M25559 Pain in unspecified hip: Secondary | ICD-10-CM | POA: Insufficient documentation

## 2010-08-05 DIAGNOSIS — J4489 Other specified chronic obstructive pulmonary disease: Secondary | ICD-10-CM | POA: Insufficient documentation

## 2010-08-05 DIAGNOSIS — J449 Chronic obstructive pulmonary disease, unspecified: Secondary | ICD-10-CM | POA: Insufficient documentation

## 2010-08-05 DIAGNOSIS — G8929 Other chronic pain: Secondary | ICD-10-CM | POA: Insufficient documentation

## 2010-08-21 IMAGING — US US RENAL
1 series · 13 of 25 positions shown · non-contrast
Comparison: CT of abdomen dated 01/17/2008

CLINICAL DATA: Status post percutaneous radiofrequency ablation of
a left lower pole renal cell carcinoma.  Recent CT is demonstrated
area of hyperenhancement along the superior margin of the ablation
and further ultrasound is performed to exclude arterial
pseudoaneurysm.

RENAL/URINARY TRACT ULTRASOUND
TECHNIQUE: Complete ultrasound examination of the urinary tract
was performed including evaluation of the kidneys, renal collecting
systems, and urinary bladder.

[Series 1: us renal · 0.22mm/px · 13 of 33 slices shown]
[im 1/33]
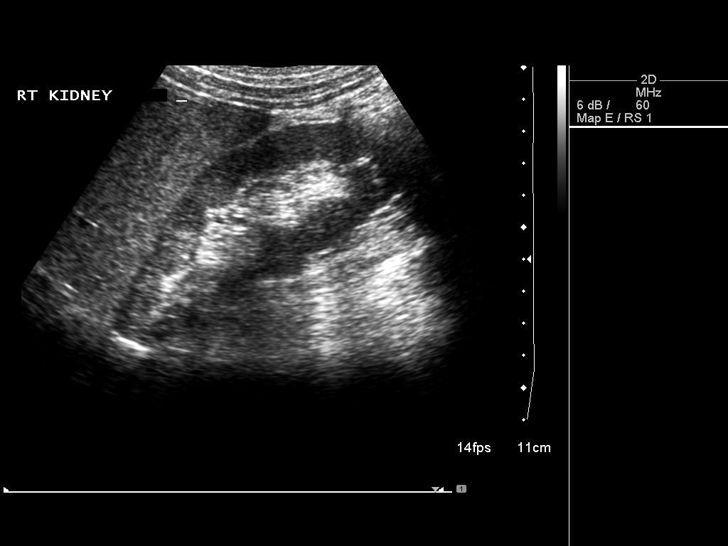
[im 3/33]
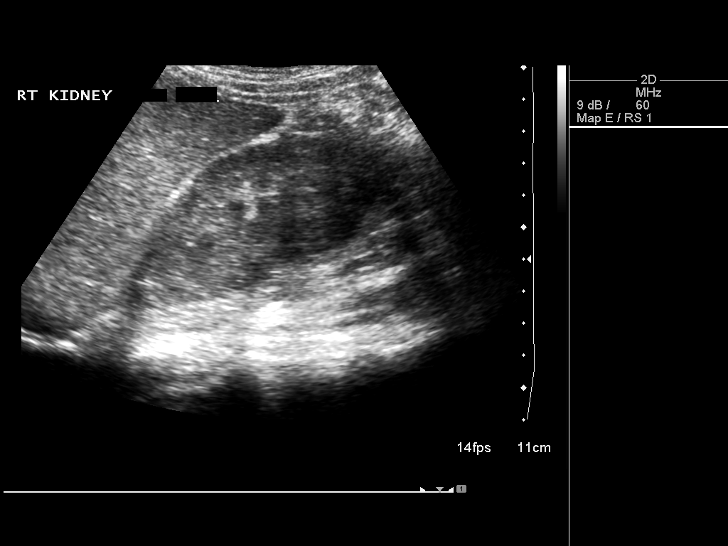
[im 6/33]
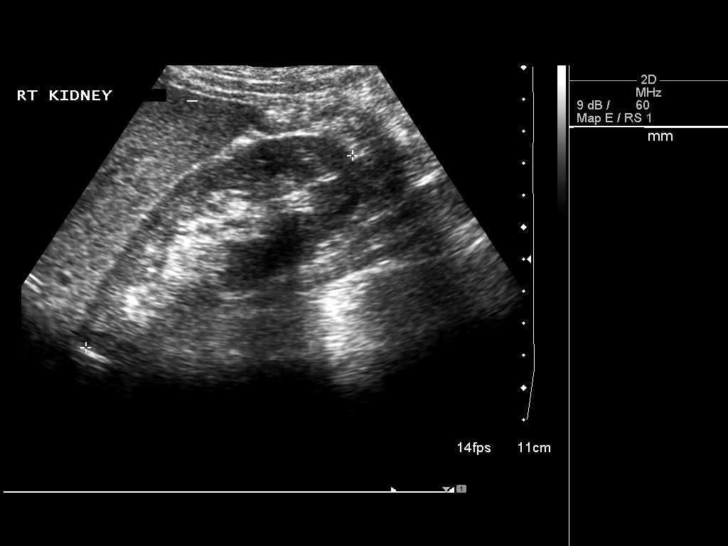
[im 9/33]
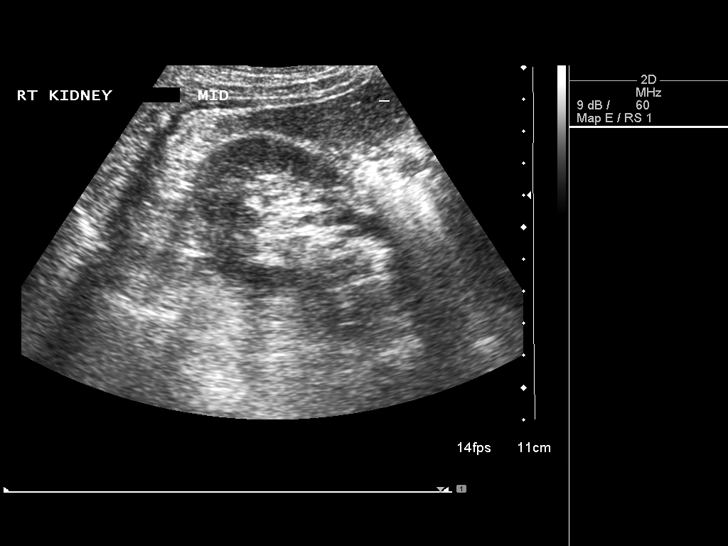
[im 11/33]
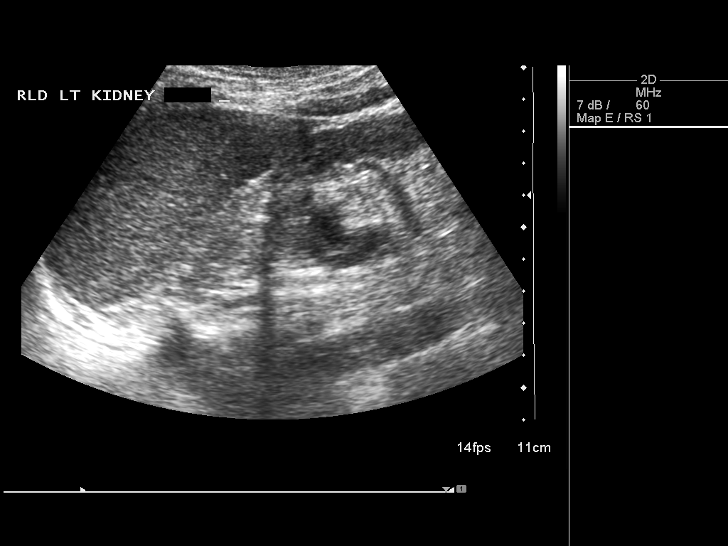
[im 14/33]
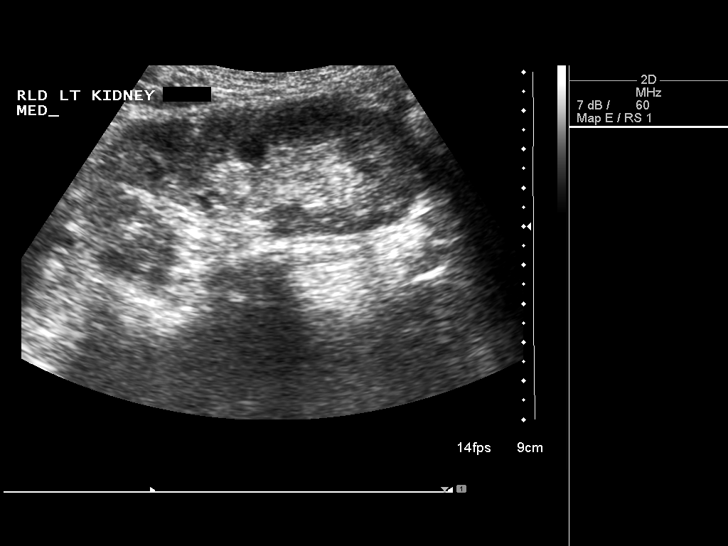
[im 17/33]
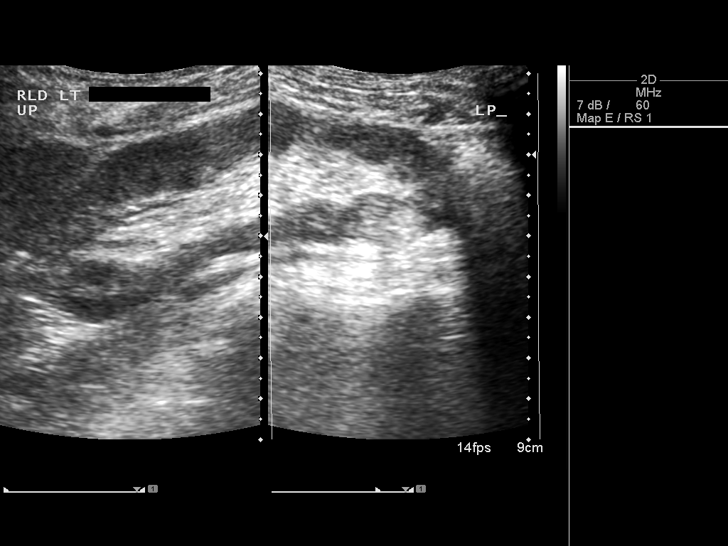
[im 19/33]
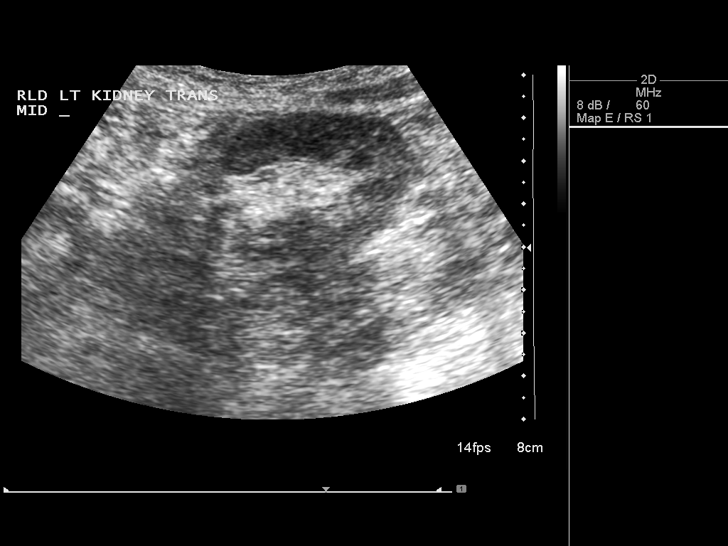
[im 22/33]
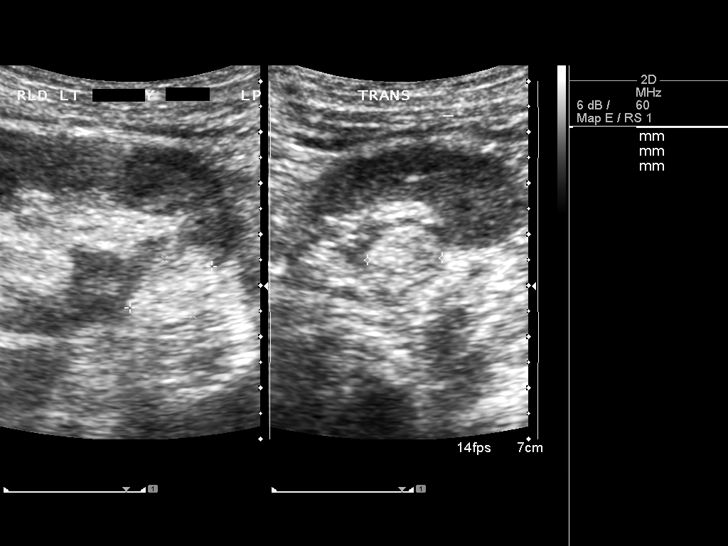
[im 25/33]
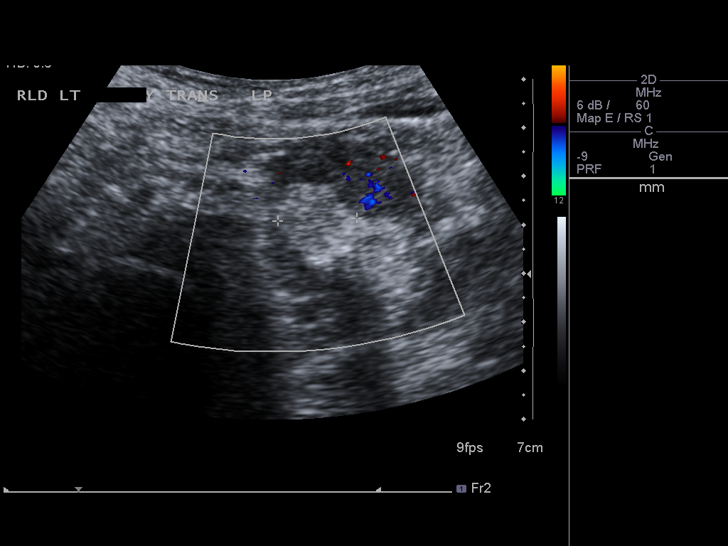
[im 27/33]
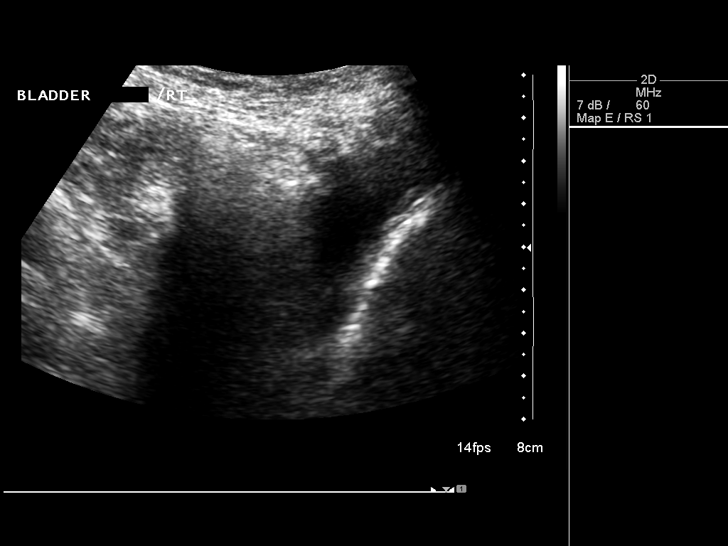
[im 30/33]
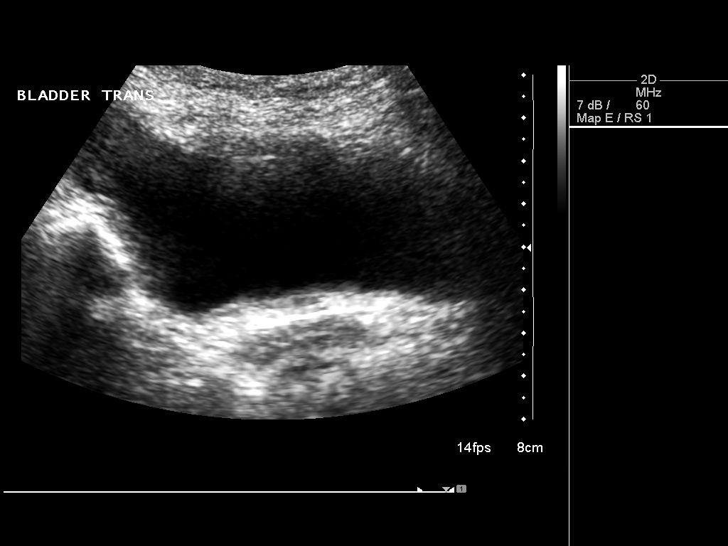
[im 33/33]
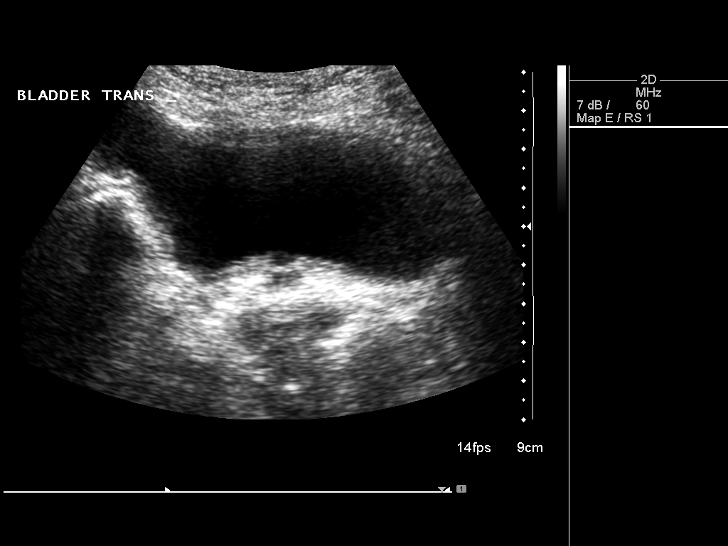

[13 of 25 positions shown; findings below may reference images not displayed]

FINDINGS: Right kidney:  10.3 cm in length.  Normal sonographic appearance
without evidence of masses or obstruction.

Left kidney:  10 cm in length.  Ablation defect noted at the medial
lower pole which is predominately hyperechoic and measures
approximately just over 2 cm.  There is a rim of relatively
hypoechoic tissue adjacent to the ablation defect.  This
corresponds to the CT abnormality but shows no evidence of vascular
flow to suggest pseudoaneurysm.  Arterial and venous flow appears
normal.  No additional left renal lesions or evidence of
hydronephrosis.

Bladder:  Unremarkable.
IMPRESSION: There is no evidence of arterial pseudoaneurysm by ultrasound at
the level of left renal tumor ablation.  Some difference in
echogenicity of tissue noted with predominant hyperechoic
appearance of the ablation defect.  There is additional relatively
hypoechoic tissue adjacent to the ablation defect but this is in
continuity with normal appearing medullary tissue.

REF:G3 DICTATED: 02/06/2008 [DATE]

## 2010-08-21 IMAGING — US US RENAL
1 series · 8 of 8 positions shown · non-contrast
Comparison: CT of abdomen dated 01/17/2008

CLINICAL DATA: Status post percutaneous radiofrequency ablation of
a left lower pole renal cell carcinoma.  Recent CT is demonstrated
area of hyperenhancement along the superior margin of the ablation
and further ultrasound is performed to exclude arterial
pseudoaneurysm.

RENAL/URINARY TRACT ULTRASOUND
TECHNIQUE: Complete ultrasound examination of the urinary tract
was performed including evaluation of the kidneys, renal collecting
systems, and urinary bladder.

[Series 1: us renal · 0.20mm/px · 8 of 8 slices shown]
[im 1/8]
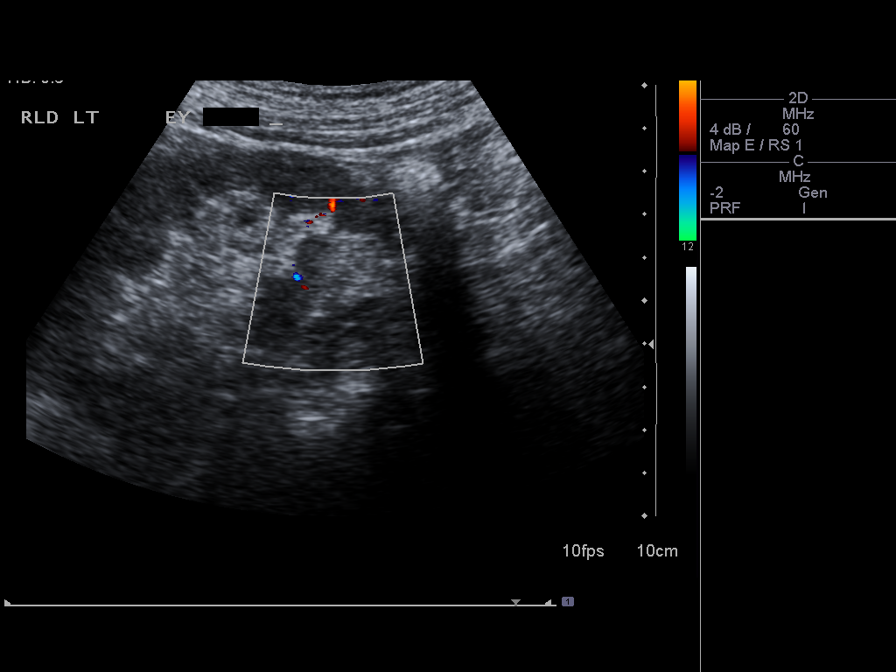
[im 2/8]
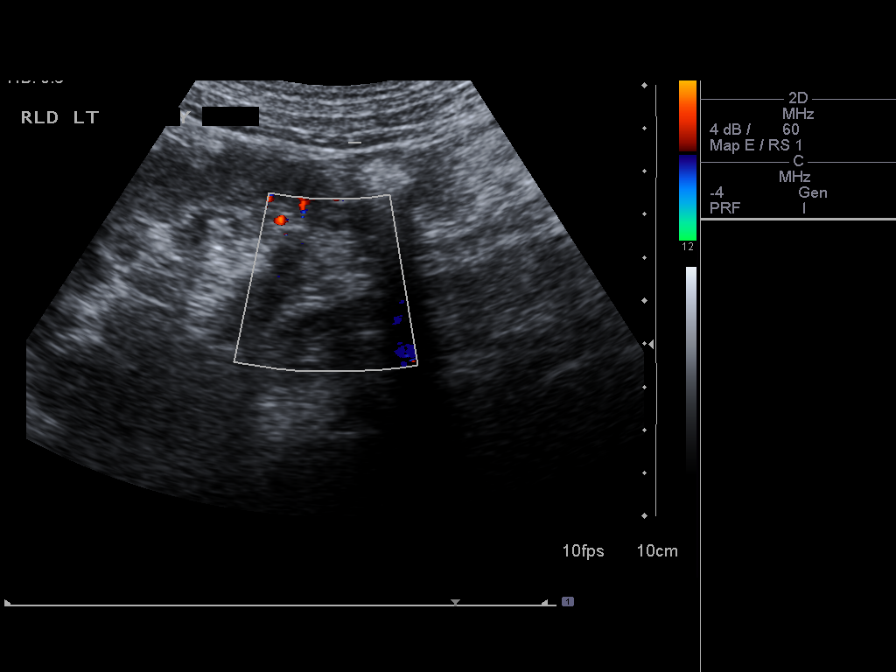
[im 3/8]
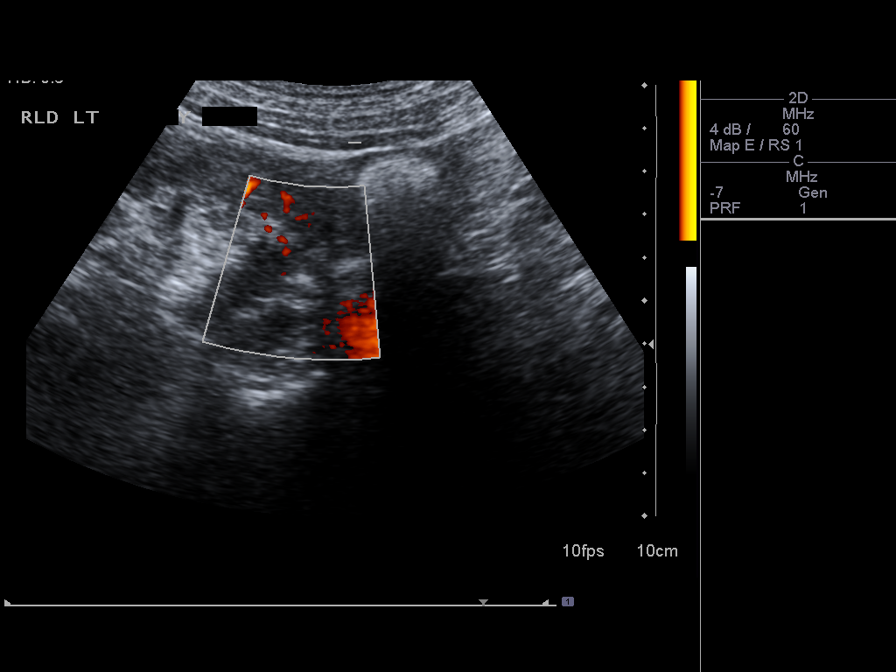
[im 4/8]
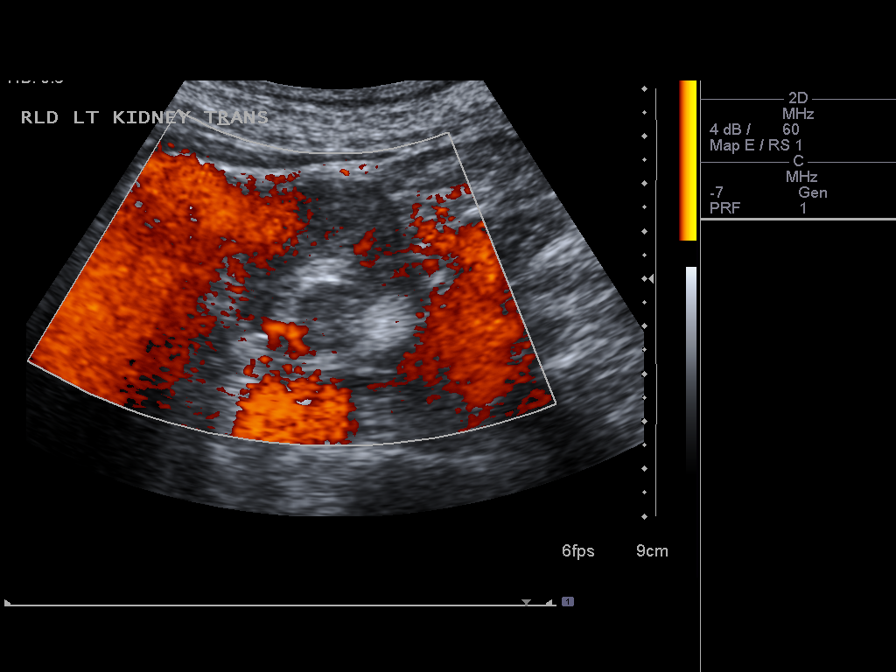
[im 5/8]
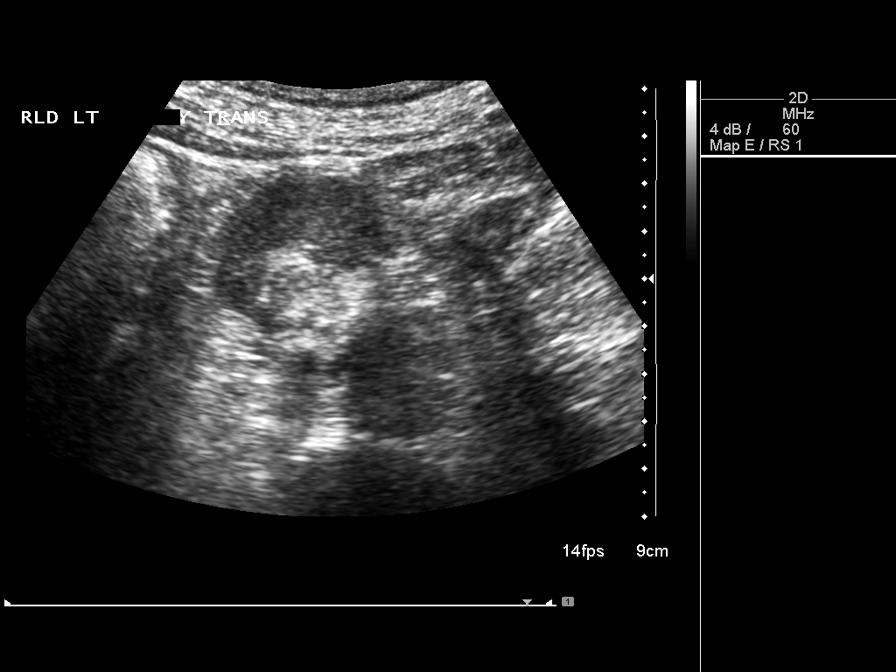
[im 6/8]
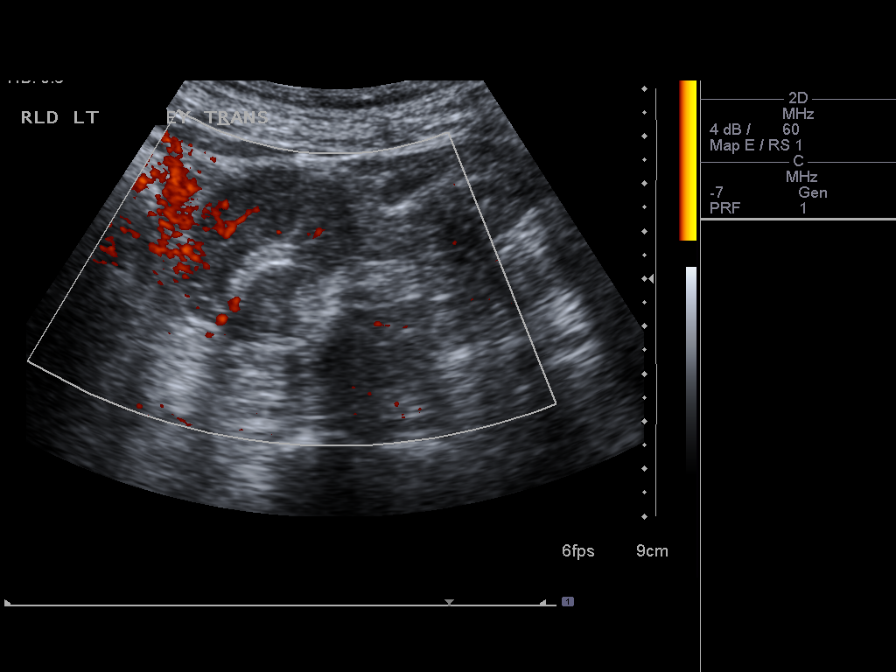
[im 7/8]
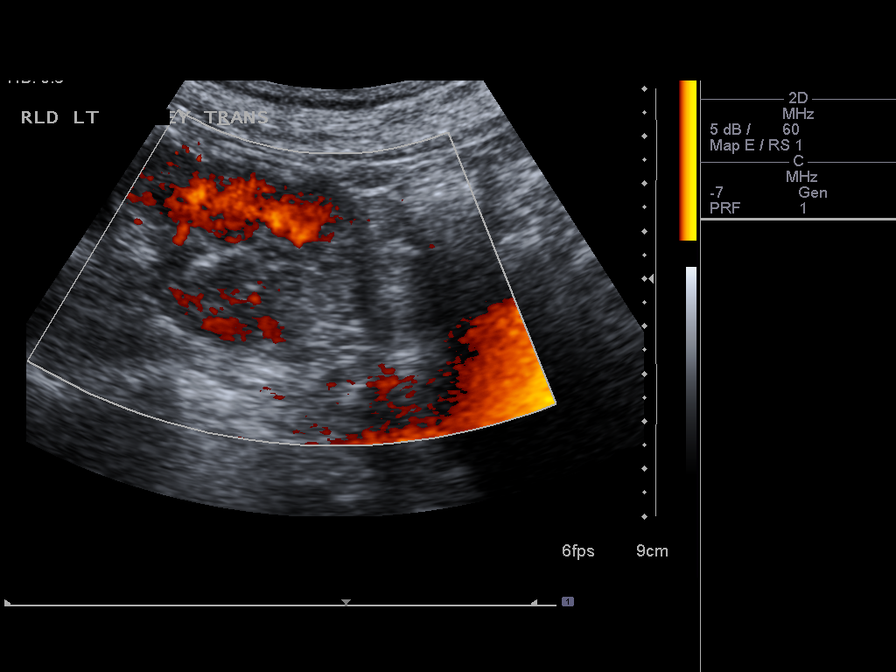
[im 8/8]
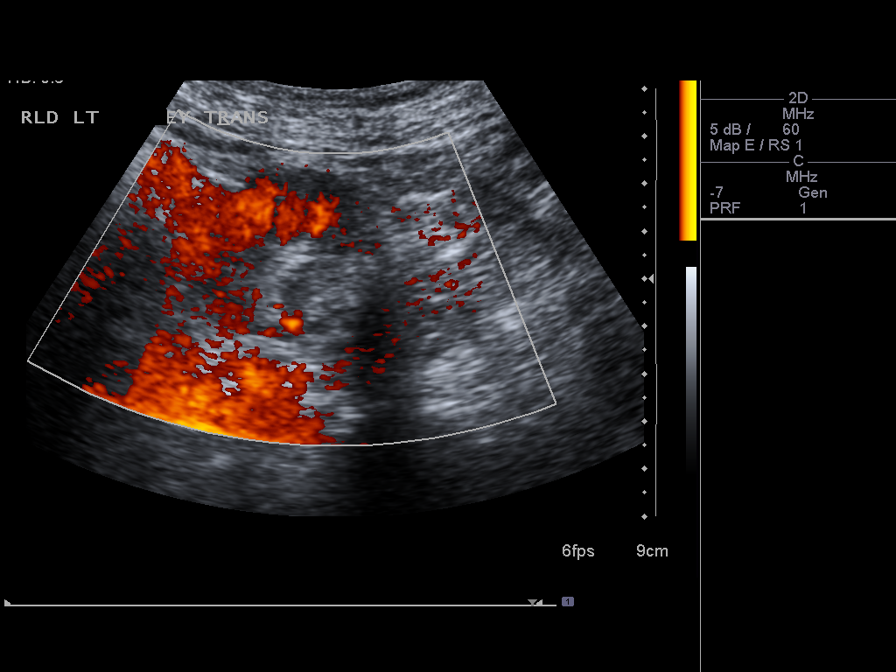

[8 of 8 positions shown; findings below may reference images not displayed]

FINDINGS: Right kidney:  10.3 cm in length.  Normal sonographic appearance
without evidence of masses or obstruction.

Left kidney:  10 cm in length.  Ablation defect noted at the medial
lower pole which is predominately hyperechoic and measures
approximately just over 2 cm.  There is a rim of relatively
hypoechoic tissue adjacent to the ablation defect.  This
corresponds to the CT abnormality but shows no evidence of vascular
flow to suggest pseudoaneurysm.  Arterial and venous flow appears
normal.  No additional left renal lesions or evidence of
hydronephrosis.

Bladder:  Unremarkable.
IMPRESSION: There is no evidence of arterial pseudoaneurysm by ultrasound at
the level of left renal tumor ablation.  Some difference in
echogenicity of tissue noted with predominant hyperechoic
appearance of the ablation defect.  There is additional relatively
hypoechoic tissue adjacent to the ablation defect but this is in
continuity with normal appearing medullary tissue.

REF:G3 DICTATED: 02/06/2008 [DATE]

## 2010-09-24 LAB — BASIC METABOLIC PANEL
Calcium: 8.8
Chloride: 111
GFR calc Af Amer: >60
GFR calc non Af Amer: >60
GFR calc non Af Amer: >60
Glucose, Bld: 88
Glucose, Bld: 92
Potassium: 3.3 — ABNORMAL LOW
Potassium: 4.1
Sodium: 140
Sodium: 141

## 2010-09-24 LAB — URINALYSIS, ROUTINE W REFLEX MICROSCOPIC
Bilirubin Urine: NEGATIVE
Bilirubin Urine: NEGATIVE
Glucose, UA: NEGATIVE
Ketones, ur: NEGATIVE
Ketones, ur: NEGATIVE
Leukocytes, UA: NEGATIVE
Nitrite: NEGATIVE
Nitrite: NEGATIVE
Protein, ur: NEGATIVE
Protein, ur: NEGATIVE
Specific Gravity, Urine: 1.003 — ABNORMAL LOW
Specific Gravity, Urine: 1.012
Urobilinogen, UA: 0.2
Urobilinogen, UA: 0.2
pH: 6

## 2010-09-24 LAB — CBC
HCT: 38.5
HCT: 39
HCT: 41.2
HCT: 42.7
Hemoglobin: 12.7
Hemoglobin: 13.2
Hemoglobin: 14.1
MCHC: 33.8
MCHC: 34.2
MCV: 91.7
MCV: 91.9
MCV: 92.1
MCV: 92.4
Platelets: 152
Platelets: 219
Platelets: 220
RBC: 3.96
RBC: 4.5
RDW: 16.1 — ABNORMAL HIGH
RDW: 16.3 — ABNORMAL HIGH
RDW: 16.6 — ABNORMAL HIGH
RDW: 16.8 — ABNORMAL HIGH
RDW: 16.9 — ABNORMAL HIGH
WBC: 5.1
WBC: 5.3

## 2010-09-24 LAB — COMPREHENSIVE METABOLIC PANEL WITH GFR
ALT: 10
AST: 18
Albumin: 3.8
CO2: 26
Calcium: 9
GFR calc Af Amer: >60
GFR calc non Af Amer: >60
Sodium: 144
Total Protein: 7

## 2010-09-24 LAB — URINE MICROSCOPIC-ADD ON

## 2010-09-24 LAB — COMPREHENSIVE METABOLIC PANEL
ALT: 11
AST: 17
Albumin: 3.9
Alkaline Phosphatase: 129 — ABNORMAL HIGH
BUN: 7
BUN: 8
CO2: 27
Calcium: 9.5
Chloride: 109
Chloride: 109
Creatinine, Ser: 0.59
Creatinine, Ser: 0.7
GFR calc Af Amer: >60
GFR calc non Af Amer: >60
Glucose, Bld: 77
Glucose, Bld: 93
Potassium: 4
Sodium: 139
Total Bilirubin: 0.5
Total Bilirubin: 0.6
Total Protein: 7.3

## 2010-09-24 LAB — DIFFERENTIAL
Basophils Absolute: 0
Basophils Relative: 1
Eosinophils Absolute: 0.1
Eosinophils Relative: 1
Eosinophils Relative: 1
Lymphocytes Relative: 21
Lymphs Abs: 1.1
Monocytes Absolute: 0.2
Monocytes Relative: 5
Monocytes Relative: 6
Neutro Abs: 3.7
Neutrophils Relative %: 68
Neutrophils Relative %: 73

## 2010-09-24 LAB — CROSSMATCH

## 2010-09-24 LAB — LIPASE, BLOOD
Lipase: 19
Lipase: 21

## 2010-09-25 LAB — COMPREHENSIVE METABOLIC PANEL
BUN: 8
CO2: 22
Calcium: 9.5
Creatinine, Ser: 0.58
GFR calc non Af Amer: >60
Glucose, Bld: 105 — ABNORMAL HIGH
Total Bilirubin: 0.5

## 2010-09-25 LAB — CBC
HCT: 41.8
Hemoglobin: 10.8 — ABNORMAL LOW
Hemoglobin: 14.3
MCHC: 34.6
MCHC: 34.2
MCV: 92.1
MCV: 92.3
Platelets: 152
RBC: 3.35 — ABNORMAL LOW
RBC: 4.53
WBC: 5.4
WBC: 5.9

## 2010-09-25 LAB — LIPASE, BLOOD: Lipase: 25

## 2010-09-25 LAB — BASIC METABOLIC PANEL
BUN: 4 — ABNORMAL LOW
CO2: 29
Calcium: 8.5
Creatinine, Ser: 0.52
GFR calc Af Amer: >60
GFR calc non Af Amer: >60
GFR calc non Af Amer: >60
Sodium: 142

## 2010-09-25 LAB — DIFFERENTIAL
Basophils Absolute: 0
Basophils Absolute: 0
Eosinophils Absolute: 0.2
Lymphocytes Relative: 24
Lymphs Abs: 1
Lymphs Abs: 1.7
Neutrophils Relative %: 66
Neutrophils Relative %: 69

## 2010-09-28 LAB — RAPID URINE DRUG SCREEN, HOSP PERFORMED
Amphetamines: NOT DETECTED
Barbiturates: NOT DETECTED

## 2010-09-28 LAB — CBC
Hemoglobin: 13.6
MCHC: 34.5
RBC: 4.23

## 2010-09-28 LAB — DIFFERENTIAL
Lymphocytes Relative: 42
Monocytes Absolute: 0.4
Monocytes Relative: 6
Neutro Abs: 3.4

## 2010-09-28 LAB — BASIC METABOLIC PANEL
CO2: 20
Calcium: 9.3
GFR calc Af Amer: >60
GFR calc non Af Amer: >60
Potassium: 4
Sodium: 140

## 2010-09-29 LAB — COMPREHENSIVE METABOLIC PANEL
ALT: 12
AST: 20
CO2: 20
Chloride: 107
GFR calc Af Amer: >60
GFR calc non Af Amer: >60
Sodium: 137
Total Bilirubin: 0.6

## 2010-10-01 LAB — COMPREHENSIVE METABOLIC PANEL
ALT: 11
BUN: 10
Calcium: 8.6
Creatinine, Ser: 0.75
Glucose, Bld: 85
Sodium: 134 — ABNORMAL LOW
Total Protein: 6

## 2010-10-01 LAB — LIPASE, BLOOD: Lipase: 24

## 2010-10-01 LAB — COMPREHENSIVE METABOLIC PANEL WITH GFR
AST: 27
Albumin: 3.5
Alkaline Phosphatase: 83
CO2: 21
Chloride: 102
GFR calc Af Amer: >60
GFR calc non Af Amer: >60
Potassium: 4.5
Total Bilirubin: 0.8

## 2010-10-01 LAB — URINE MICROSCOPIC-ADD ON

## 2010-10-01 LAB — CBC
HCT: 38.2
Hemoglobin: 13.1
MCHC: 34.2
MCV: 97.2
Platelets: 160
RBC: 3.93
RDW: 12.9
WBC: 6.3

## 2010-10-01 LAB — DIFFERENTIAL
Basophils Absolute: 0
Basophils Relative: 1
Eosinophils Absolute: 0.2
Eosinophils Relative: 4
Lymphocytes Relative: 37
Lymphs Abs: 2.3
Monocytes Absolute: 0.4
Monocytes Relative: 6
Neutro Abs: 3.4
Neutrophils Relative %: 53

## 2010-10-01 LAB — URINALYSIS, ROUTINE W REFLEX MICROSCOPIC
Bilirubin Urine: NEGATIVE
Glucose, UA: NEGATIVE
Ketones, ur: NEGATIVE
Nitrite: NEGATIVE
Protein, ur: NEGATIVE
Specific Gravity, Urine: 1.005
Urobilinogen, UA: 0.2
pH: 6

## 2010-10-01 LAB — ETHANOL

## 2010-10-01 LAB — RAPID URINE DRUG SCREEN, HOSP PERFORMED
Amphetamines: NOT DETECTED
Barbiturates: NOT DETECTED
Benzodiazepines: POSITIVE — AB
Cocaine: NOT DETECTED
Opiates: POSITIVE — AB
Tetrahydrocannabinol: NOT DETECTED

## 2010-10-02 LAB — COMPREHENSIVE METABOLIC PANEL
ALT: 18
AST: 29
Albumin: 4
Alkaline Phosphatase: 119 — ABNORMAL HIGH
BUN: 8
CO2: 22
Chloride: 102
Creatinine, Ser: 0.77
GFR calc Af Amer: >60
GFR calc non Af Amer: >60
Glucose, Bld: 78
Potassium: 3.6
Potassium: 4.2
Sodium: 136
Total Bilirubin: 0.9
Total Bilirubin: 1
Total Protein: 7.9

## 2010-10-02 LAB — CBC
HCT: 43.3
HCT: 51 — ABNORMAL HIGH
Hemoglobin: 16.9 — ABNORMAL HIGH
MCV: 98.1
Platelets: 183
RBC: 4.41
RBC: 5.19 — ABNORMAL HIGH
RDW: 14
WBC: 5.3

## 2010-10-02 LAB — RAPID URINE DRUG SCREEN, HOSP PERFORMED
Benzodiazepines: POSITIVE — AB
Cocaine: NOT DETECTED
Tetrahydrocannabinol: NOT DETECTED

## 2010-10-02 LAB — DIFFERENTIAL
Basophils Absolute: 0
Basophils Relative: 1
Eosinophils Absolute: 0
Lymphocytes Relative: 16
Lymphs Abs: 0.8
Monocytes Relative: 3
Monocytes Relative: 6
Neutro Abs: 6.7
Neutrophils Relative %: 79 — ABNORMAL HIGH
Neutrophils Relative %: 81 — ABNORMAL HIGH

## 2010-10-02 LAB — URINALYSIS, ROUTINE W REFLEX MICROSCOPIC
Bilirubin Urine: NEGATIVE
Glucose, UA: NEGATIVE
Protein, ur: 30 — AB
Protein, ur: NEGATIVE
Urobilinogen, UA: 0.2
Urobilinogen, UA: 0.2

## 2010-10-02 LAB — ETHANOL: Alcohol, Ethyl (B): 47 — ABNORMAL HIGH

## 2010-10-02 LAB — URINE MICROSCOPIC-ADD ON

## 2010-10-02 LAB — AMYLASE: Amylase: 85

## 2010-10-03 IMAGING — CR DG CHEST 1V PORT
2 series · 2 of 2 positions shown · non-contrast
Comparison: 08/21/2007

CLINICAL DATA: Chest pain.  Short of breath.

PORTABLE CHEST - 1 VIEW

[view not recorded (1 of 2)]
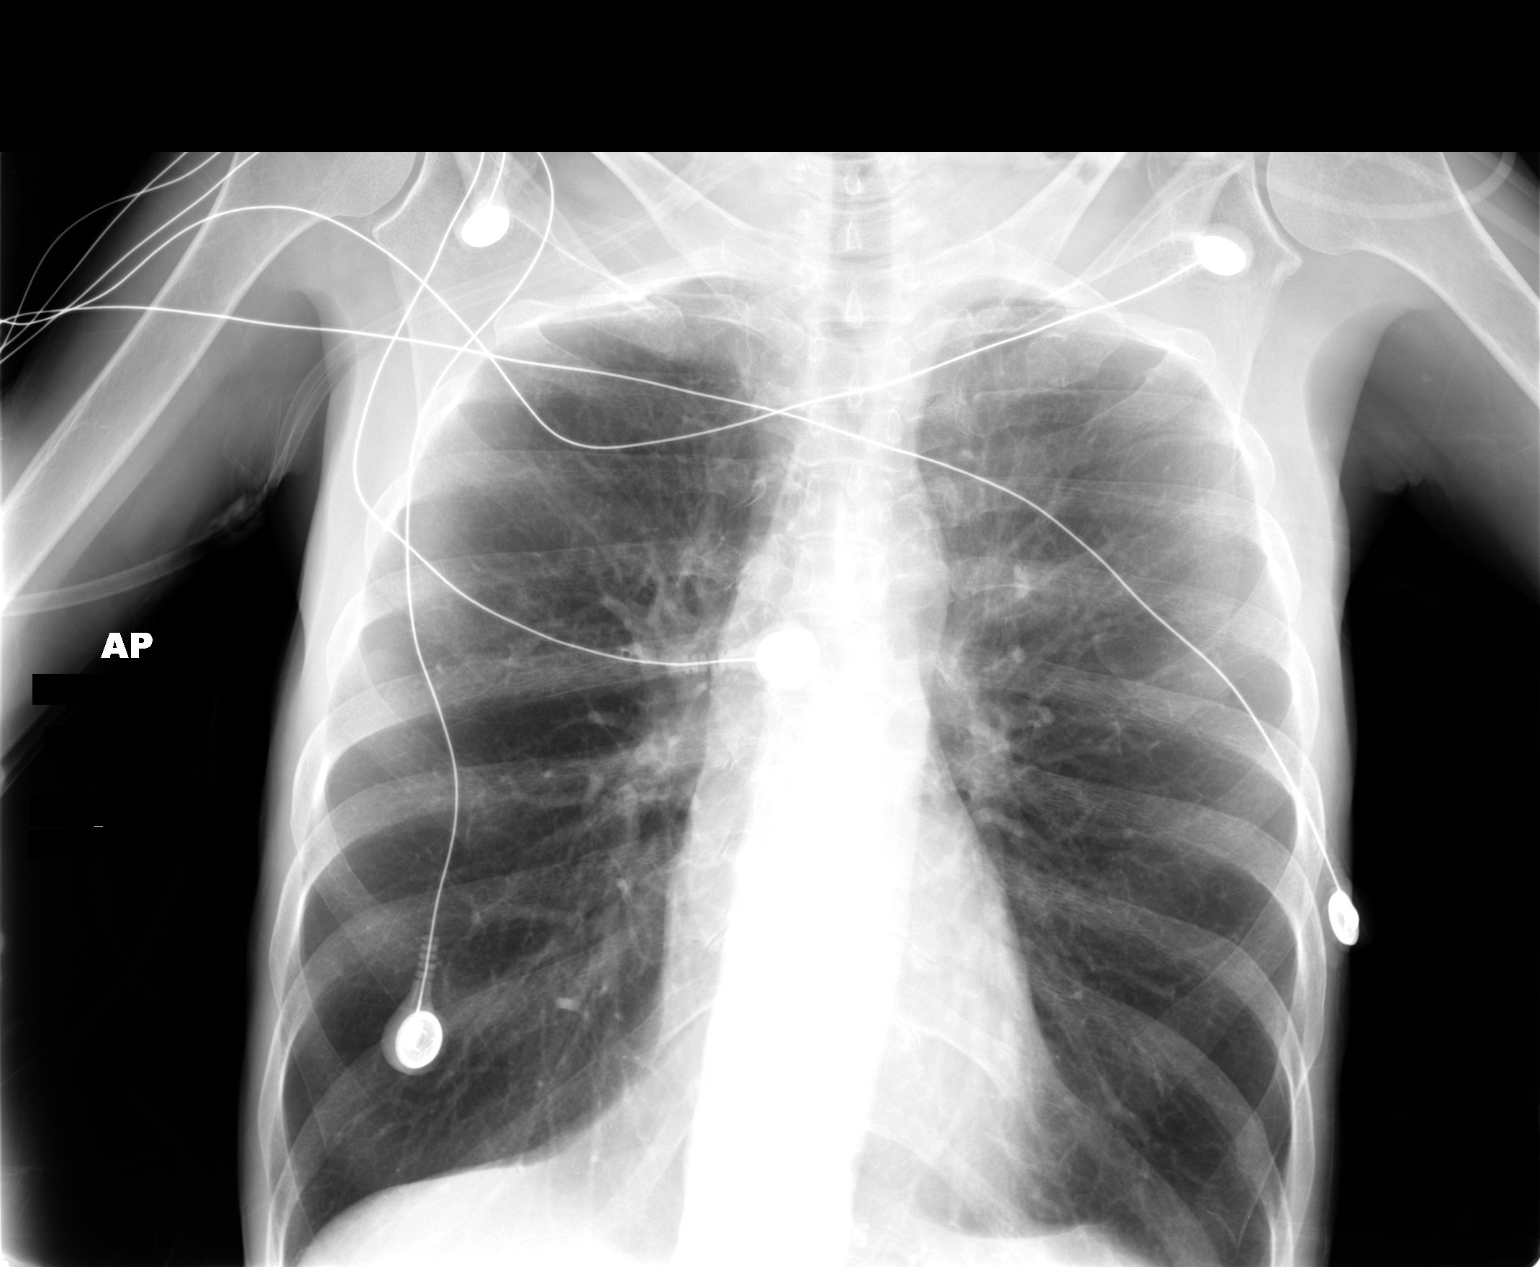

[view not recorded (2 of 2)]
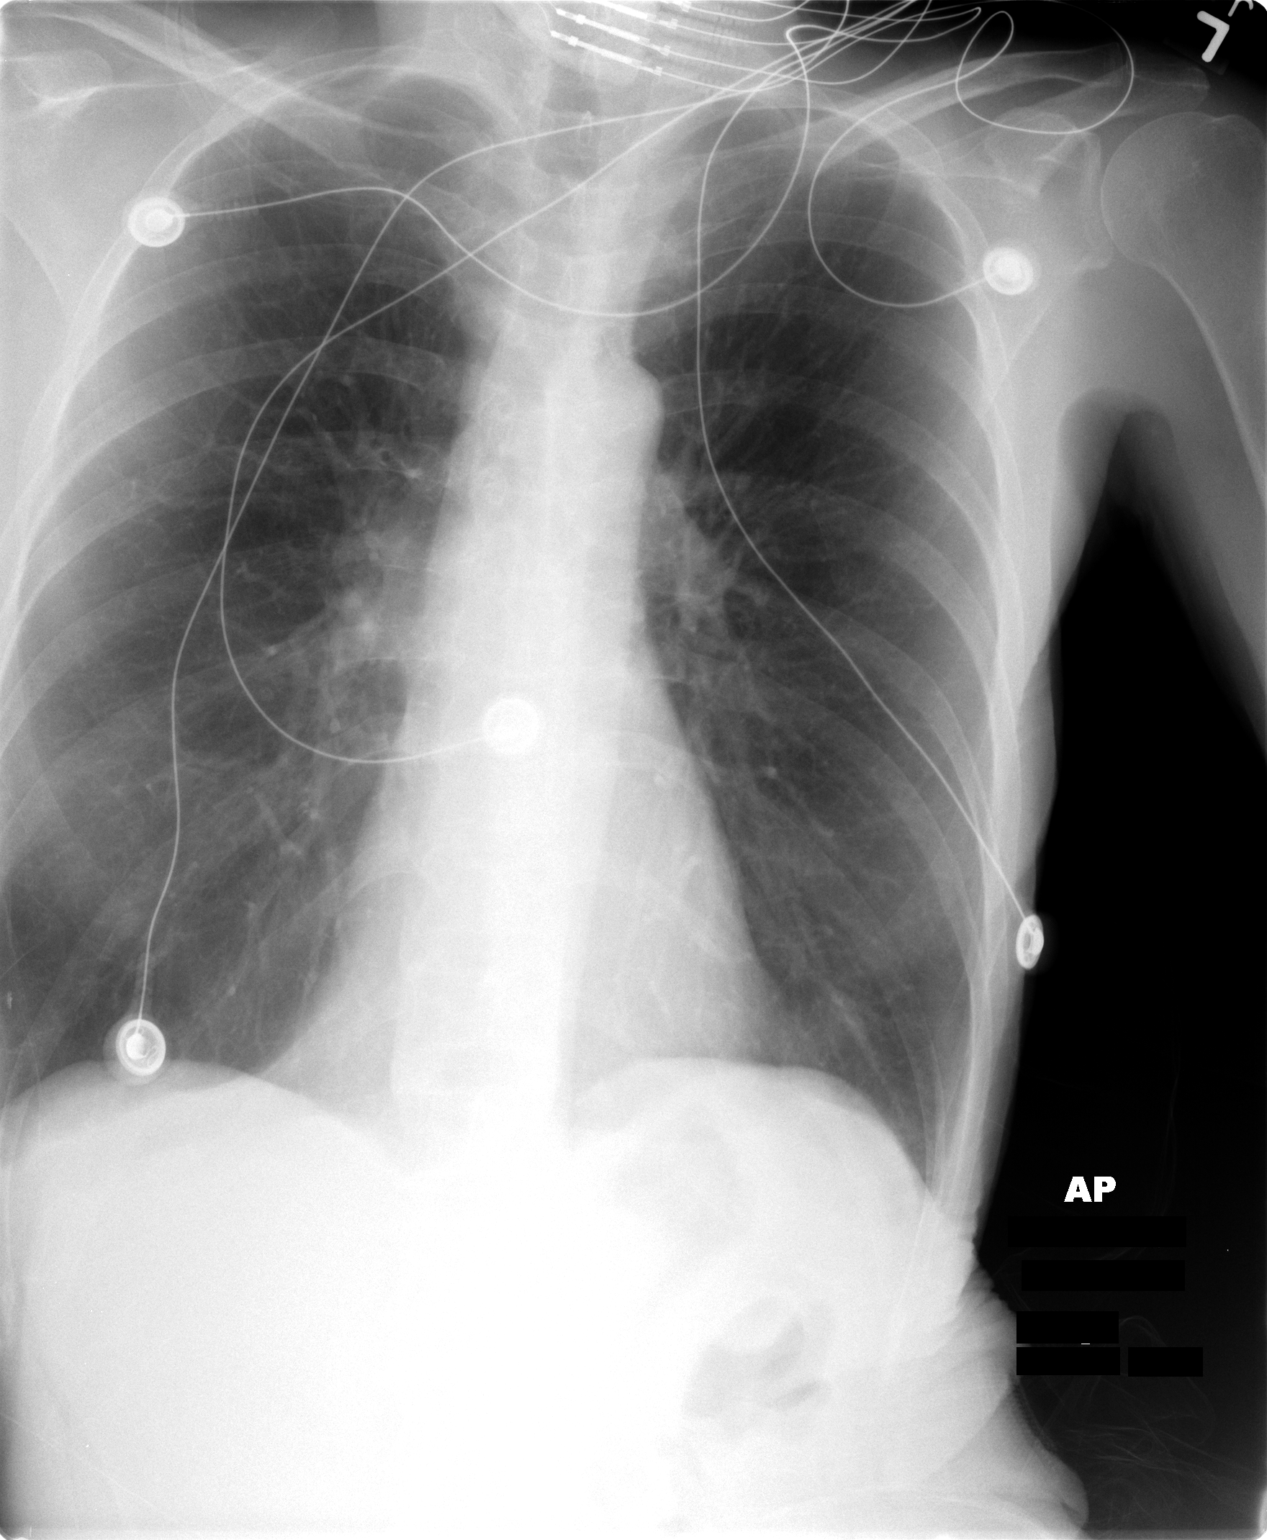

[2 of 2 positions shown; findings below may reference images not displayed]

FINDINGS: Lungs are again seen to be hyperinflated, with some
scarring in the upper lobes.  No sign of active infiltrate, mass,
effusion or collapse.  The heart is not enlarged.  Vascularity is
normal.
IMPRESSION: Pulmonary hyperinflation with some upper lobe scarring.  Similar to
previous examination.  No active process evident.

## 2010-10-03 IMAGING — CT CT ABDOMEN W/ CM
1 of 2 series · 14 of 32 positions shown, 19 images · IV contrast (agent unspecified)
Comparison: 01/17/2008

CT ABDOMEN

CLINICAL DATA: Abdominal pain.  History of chronic pancreatitis.
Partial gastrectomy.

CT ABDOMEN AND PELVIS WITH CONTRAST
TECHNIQUE: Multidetector CT imaging of the abdomen and pelvis was
performed using the standard protocol following bolus
administration of intravenous contrast.
Contrast: 100 ml Amnipaque-HPP

[Series 2: rtn ap with st · axial · 0.58mm/px · z∈[-454,-80]mm · 14 of 83 slices shown, 19 images]
[im 4/83  soft-tissue]
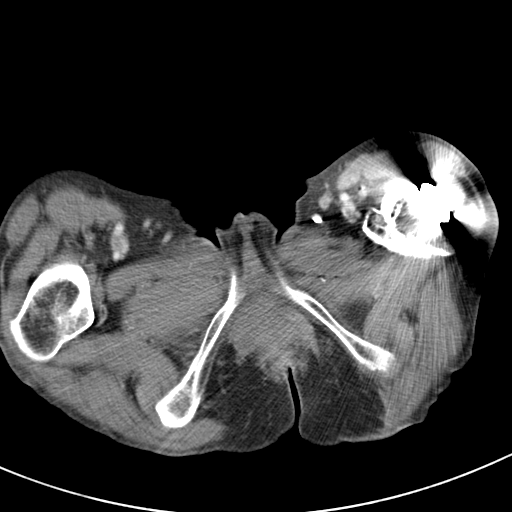
[im 4/83  bone]
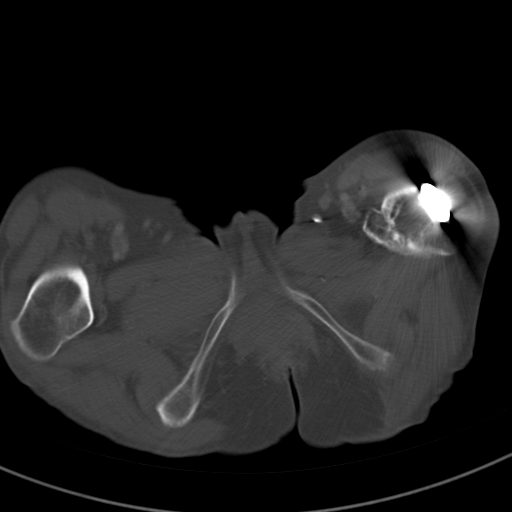
[im 12/83  soft-tissue]
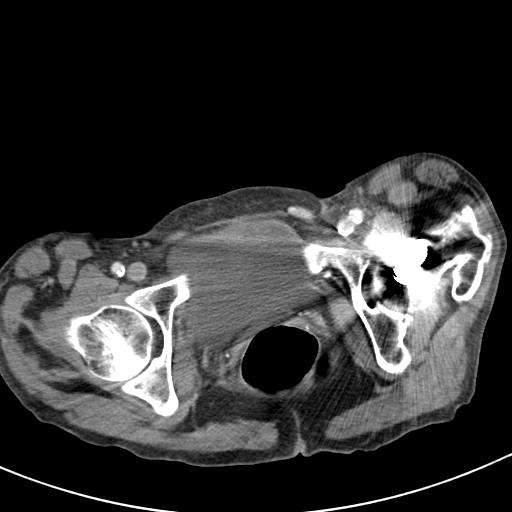
[im 16/83  soft-tissue]
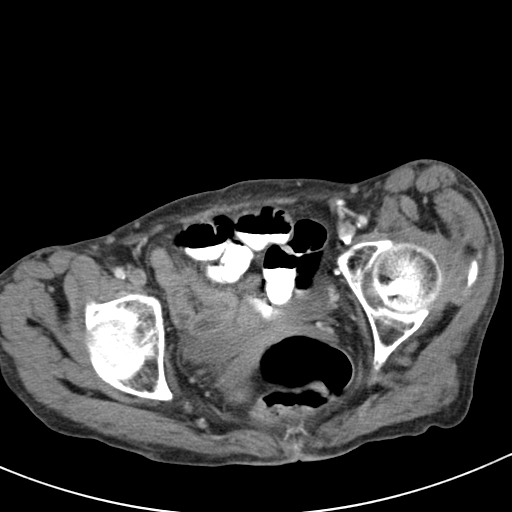
[im 24/83  soft-tissue]
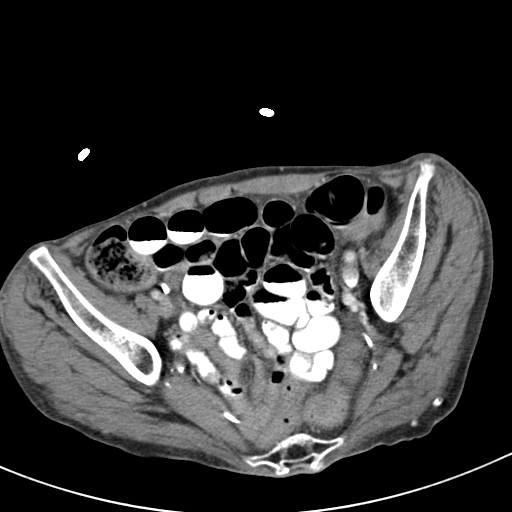
[im 28/83  soft-tissue]
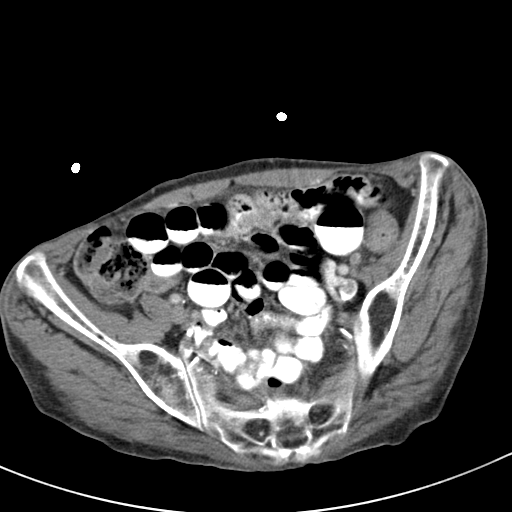
[im 36/83  soft-tissue]
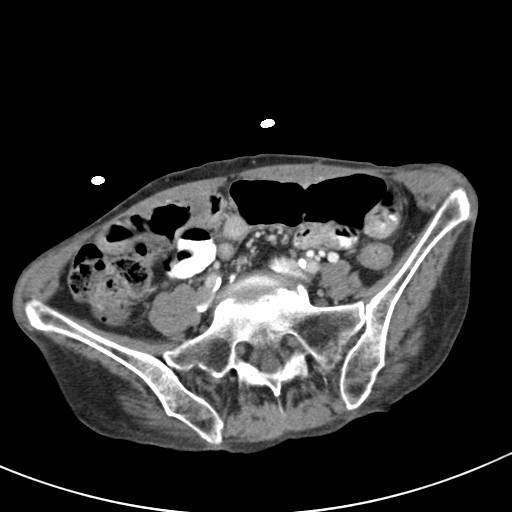
[im 43/83  soft-tissue]
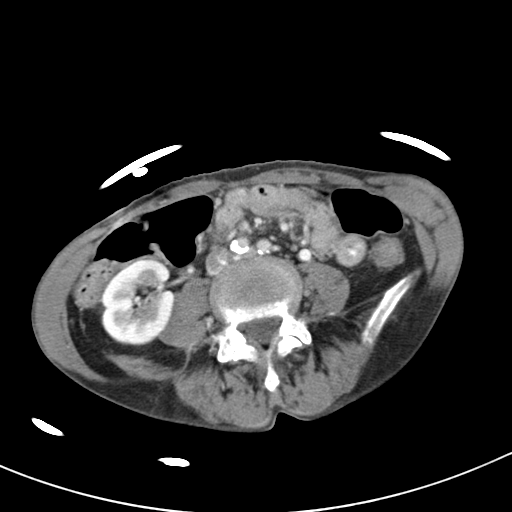
[im 47/83  soft-tissue]
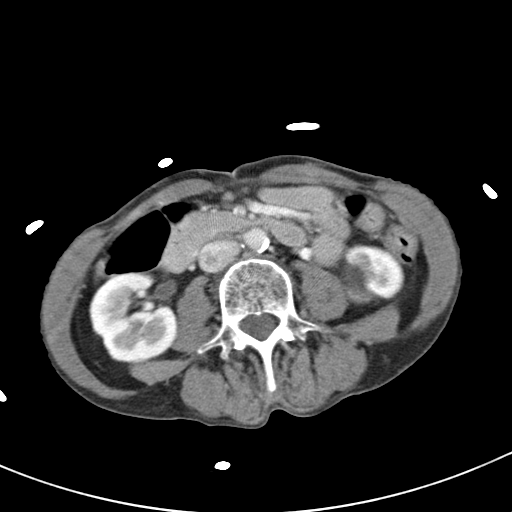
[im 55/83  soft-tissue]
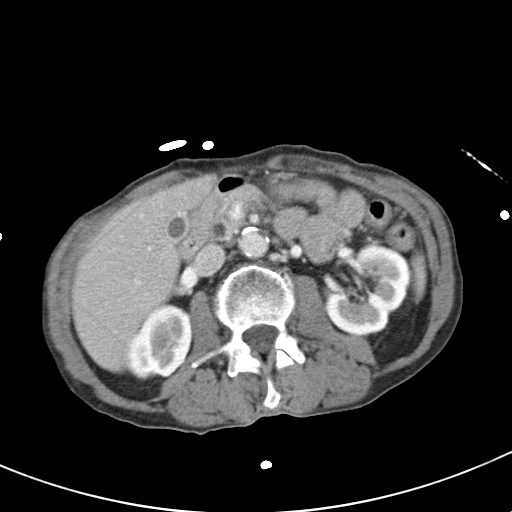
[im 55/83  bone]
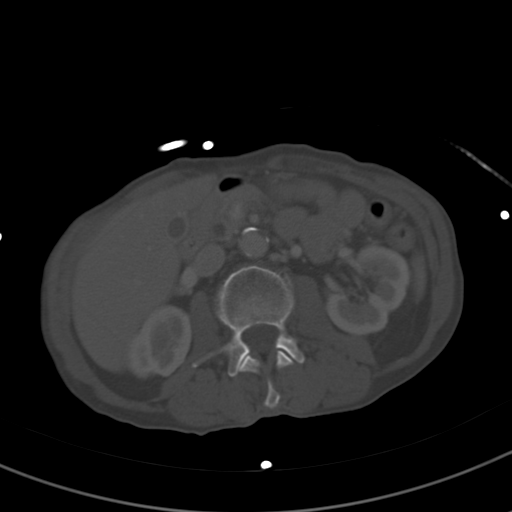
[im 59/83  soft-tissue]
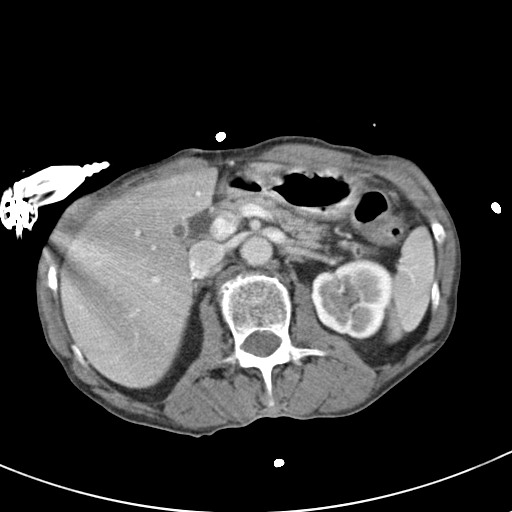
[im 67/83  soft-tissue]
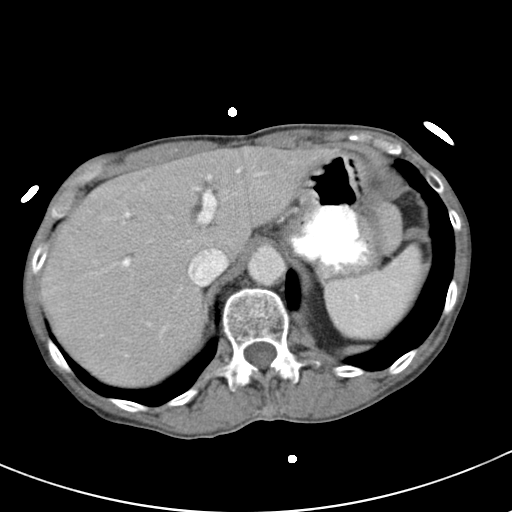
[im 67/83  lung]
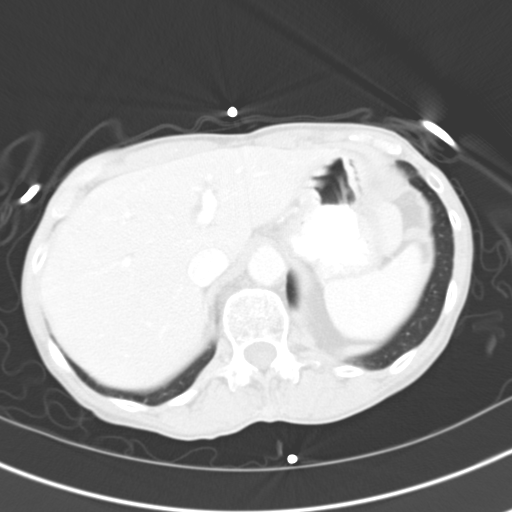
[im 71/83  soft-tissue]
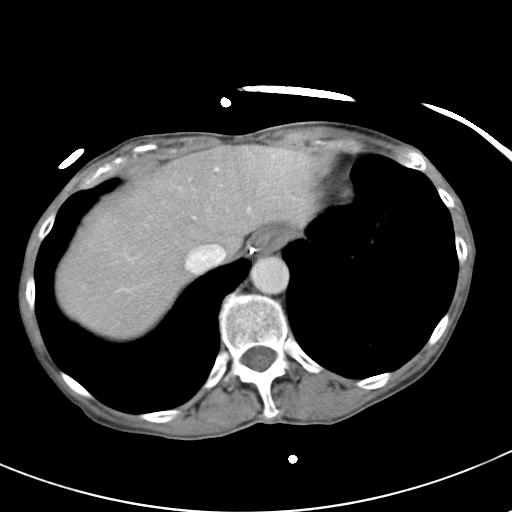
[im 71/83  lung]
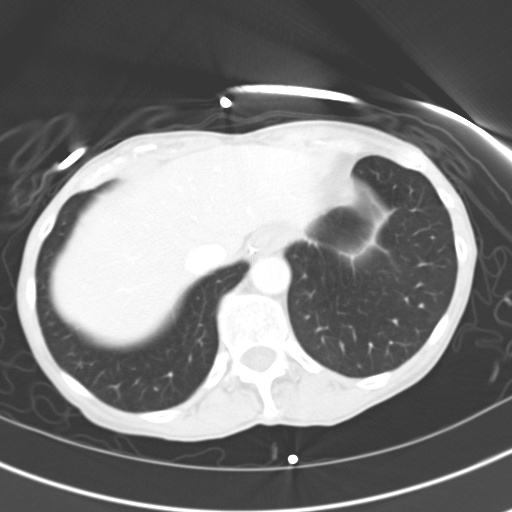
[im 75/83  lung]
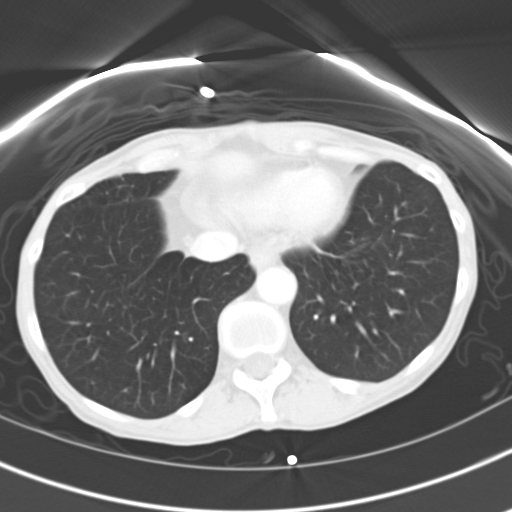
[im 79/83  soft-tissue]
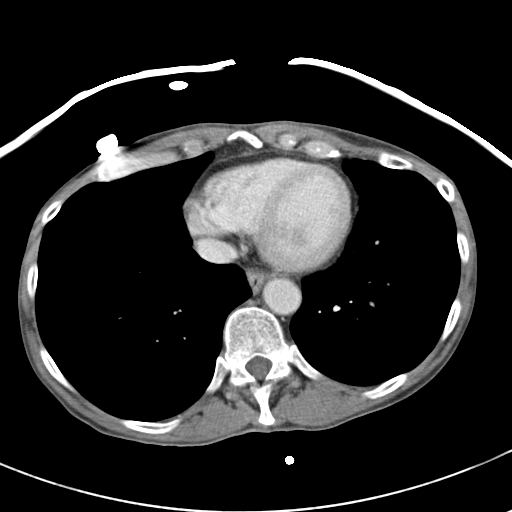
[im 79/83  lung]
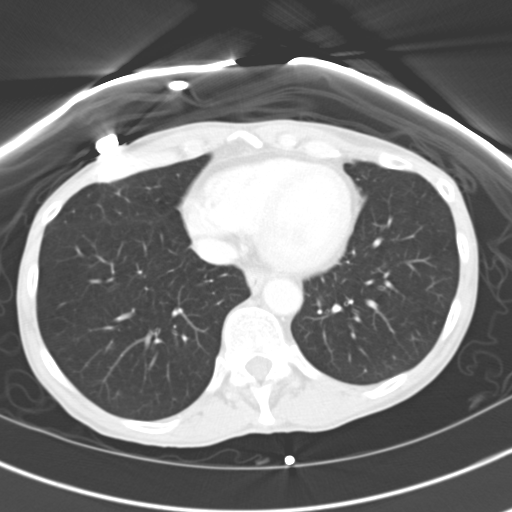

[14 of 32 positions shown; findings below may reference images not displayed]

FINDINGS: Lung bases are clear.  No pleural or pericardial fluid.
The liver does not show any focal lesions.  There is chronic
gallbladder wall thickening but no calcified stones.  The biliary
ductal tree remains slightly prominent all the way to the ampulla.
The spleen is normal.  No evidence of pancreatitis.  There may be
minimal prominence of the pancreatic duct.  The adrenal glands are
normal.  The kidneys are normal except an area of low density at
the inferior pole of the left kidney where there has been previous
radiofrequency ablation for renal cell carcinoma.  This appears
unchanged.  There is atherosclerosis of the aorta but no aneurysm.
The IVC is unremarkable.  No retroperitoneal mass or adenopathy.
No free intraperitoneal fluid or air.  No bowel pathology is seen.
IMPRESSION: No active abdominal pathology.  No sign of ongoing pancreatitis.
The biliary ductule tree is slightly prominent as is the pancreatic
duct.  There is chronic thickening of the gallbladder wall.

CT PELVIS
FINDINGS: No free fluid in the pelvis.  There is been previous
hysterectomy.  No mass or adenopathy.  No bowel pathology is seen.
IMPRESSION: Negative CT scan of the pelvis

## 2010-10-04 IMAGING — XA IR US GUIDE VASC ACCESS LEFT
1 series · 1 of 1 positions shown · non-contrast
Comparison: none

CLINICAL DATA: Pancreatitis.  PICC line needed for more secure IV
access due to need for IV antibiotics and IV pain medications.

UPPER EXTREMITY PICC PLACEMENT WITH ULTRASOUND AND FLUORO GUIDANCE
TECHNIQUE: The left arm was prepped with chlorhexidine, draped in
the usual sterile fashion using maximum barrier technique and
infiltrated locally with 1% Lidocaine.  Ultrasound demonstrated
patency of the left brachial vein.  Under real-time ultrasound
guidance, this vein was accessed with a 21 gauge micropuncture
needle.  Ultrasound image documentation was performed.  The needle
was exchanged over a guidewire for a peel-away sheath through which
a 5 French single lumen PICC trimmed to 37cm was advanced,
positioned with its tip at the distal SVC/right atrial junction.
Fluoroscopy during the procedure and fluoro spot radiograph
confirms appropriate catheter position.  The catheter was flushed,
secured to the skin with Prolene sutures, and covered with a
sterile dressing.  No immediate complication.
Fluoroscopy Time: 0.1 minutes.

[Series 1000: run · 0.15mm/px · 1 of 1 slices shown]
[im 1/1]
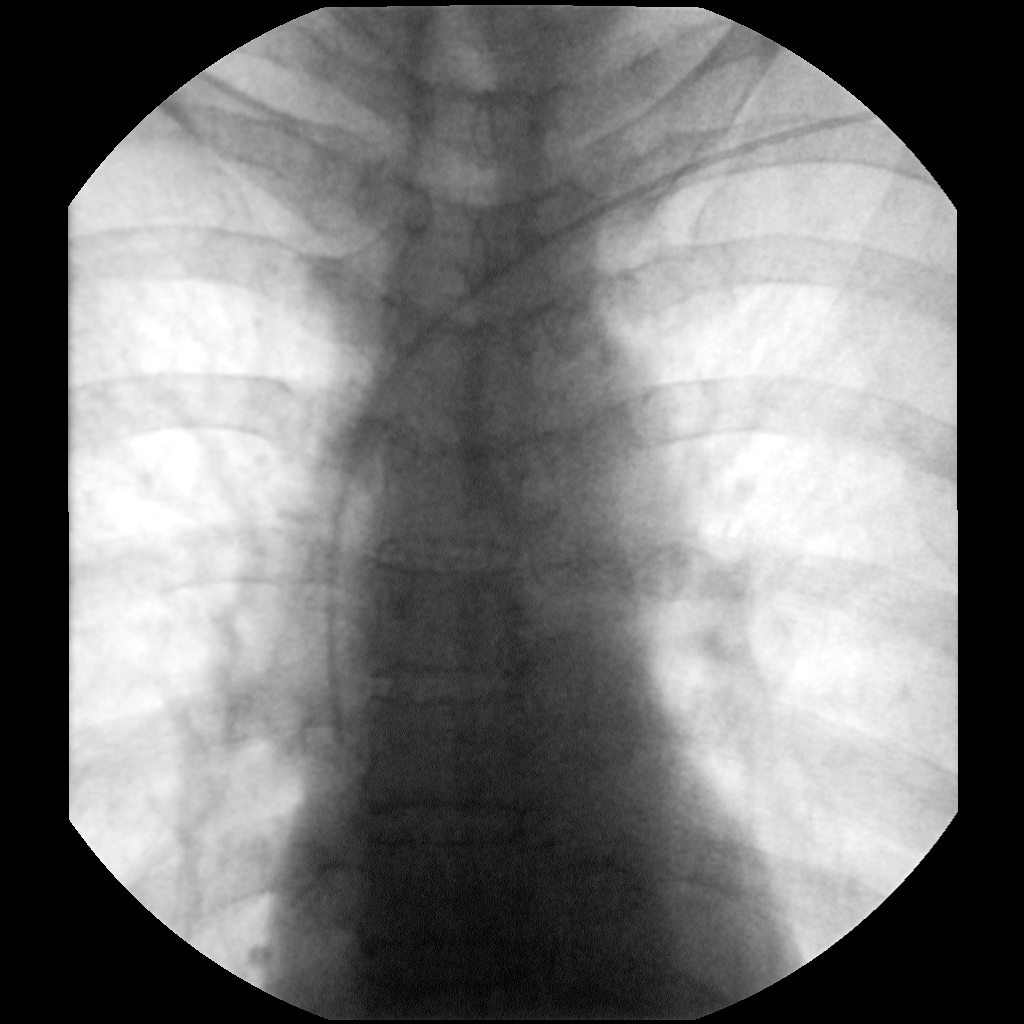

[1 of 1 positions shown; findings below may reference images not displayed]

IMPRESSION: Technically successful left arm PICC placement with ultrasound and
fluoroscopic guidance.  The catheter is ready for use.

## 2010-10-04 IMAGING — US IR US GUIDE VASC ACCESS LEFT
1 series · 1 of 1 positions shown · non-contrast
Comparison: none

CLINICAL DATA: Pancreatitis.  PICC line needed for more secure IV
access due to need for IV antibiotics and IV pain medications.

UPPER EXTREMITY PICC PLACEMENT WITH ULTRASOUND AND FLUORO GUIDANCE
TECHNIQUE: The left arm was prepped with chlorhexidine, draped in
the usual sterile fashion using maximum barrier technique and
infiltrated locally with 1% Lidocaine.  Ultrasound demonstrated
patency of the left brachial vein.  Under real-time ultrasound
guidance, this vein was accessed with a 21 gauge micropuncture
needle.  Ultrasound image documentation was performed.  The needle
was exchanged over a guidewire for a peel-away sheath through which
a 5 French single lumen PICC trimmed to 37cm was advanced,
positioned with its tip at the distal SVC/right atrial junction.
Fluoroscopy during the procedure and fluoro spot radiograph
confirms appropriate catheter position.  The catheter was flushed,
secured to the skin with Prolene sutures, and covered with a
sterile dressing.  No immediate complication.
Fluoroscopy Time: 0.1 minutes.

[Series 1: sp us guide vasc access*left* · 1 of 1 slices shown]
[im 1/1]
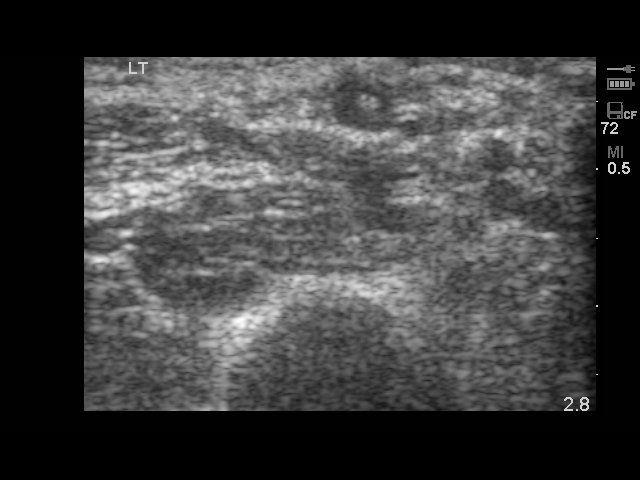

[1 of 1 positions shown; findings below may reference images not displayed]

IMPRESSION: Technically successful left arm PICC placement with ultrasound and
fluoroscopic guidance.  The catheter is ready for use.

## 2010-10-05 LAB — COMPREHENSIVE METABOLIC PANEL
Albumin: 4.4
BUN: 19
Creatinine, Ser: 0.54
Total Bilirubin: 1
Total Protein: 7.4

## 2010-10-05 LAB — ETHANOL: Alcohol, Ethyl (B): <5

## 2010-10-06 ENCOUNTER — Other Ambulatory Visit: Payer: Self-pay | Admitting: Emergency Medicine

## 2010-10-06 ENCOUNTER — Other Ambulatory Visit (HOSPITAL_COMMUNITY): Payer: Self-pay | Admitting: Interventional Radiology

## 2010-10-06 ENCOUNTER — Other Ambulatory Visit: Payer: Self-pay | Admitting: Interventional Radiology

## 2010-10-06 DIAGNOSIS — N2889 Other specified disorders of kidney and ureter: Secondary | ICD-10-CM

## 2010-10-06 LAB — DIFFERENTIAL
Basophils Absolute: 0.1
Basophils Absolute: 0.1
Basophils Relative: 1
Eosinophils Absolute: 0.3
Eosinophils Relative: 3
Eosinophils Relative: 4
Lymphocytes Relative: 22
Lymphs Abs: 1.5
Monocytes Absolute: 0.3
Monocytes Absolute: 0.5
Neutro Abs: 4.2
Neutro Abs: 4.7

## 2010-10-06 LAB — CBC
HCT: 39.9
HCT: 40.6
Hemoglobin: 13.3
MCHC: 32.6
MCV: 96.6
Platelets: 159
Platelets: 163
RDW: 14.9
WBC: 6.7

## 2010-10-06 LAB — COMPREHENSIVE METABOLIC PANEL
ALT: 15
AST: 18
AST: 20
Albumin: 3.7
Albumin: 4.1
Alkaline Phosphatase: 65
BUN: 10
BUN: 13
Calcium: 9.6
Chloride: 107
Creatinine, Ser: 0.77
GFR calc Af Amer: >60
Potassium: 4
Sodium: 138
Total Bilirubin: 0.5

## 2010-10-06 LAB — ETHANOL
Alcohol, Ethyl (B): 101 — ABNORMAL HIGH
Alcohol, Ethyl (B): 250 — ABNORMAL HIGH

## 2010-10-06 LAB — BILIRUBIN, TOTAL: Total Bilirubin: 0.4

## 2010-10-07 LAB — COMPREHENSIVE METABOLIC PANEL
ALT: 12
Alkaline Phosphatase: 74
CO2: 20
GFR calc non Af Amer: >60
Glucose, Bld: 92
Potassium: 3.5
Sodium: 137
Total Bilirubin: 0.6

## 2010-10-07 LAB — URINALYSIS, ROUTINE W REFLEX MICROSCOPIC
Glucose, UA: NEGATIVE
Leukocytes, UA: NEGATIVE
Nitrite: NEGATIVE
Protein, ur: NEGATIVE
pH: 6.5

## 2010-10-07 LAB — URINE MICROSCOPIC-ADD ON

## 2010-10-07 LAB — LIPASE, BLOOD: Lipase: 18

## 2010-10-09 LAB — URINALYSIS, ROUTINE W REFLEX MICROSCOPIC
Bilirubin Urine: NEGATIVE
Ketones, ur: NEGATIVE
Nitrite: NEGATIVE
Urobilinogen, UA: 0.2

## 2010-10-13 LAB — URINALYSIS, ROUTINE W REFLEX MICROSCOPIC
Bilirubin Urine: NEGATIVE
Bilirubin Urine: NEGATIVE
Bilirubin Urine: NEGATIVE
Glucose, UA: NEGATIVE
Ketones, ur: NEGATIVE
Ketones, ur: NEGATIVE
Leukocytes, UA: NEGATIVE
Nitrite: POSITIVE — AB
Nitrite: NEGATIVE
Protein, ur: NEGATIVE
Specific Gravity, Urine: 1.006
Specific Gravity, Urine: 1.006
Specific Gravity, Urine: 1.015
Urobilinogen, UA: 0.2
Urobilinogen, UA: 0.2
pH: 7
pH: 6.5
pH: 7

## 2010-10-13 LAB — DIFFERENTIAL
Basophils Absolute: 0
Basophils Absolute: 0
Basophils Absolute: 0
Basophils Absolute: 0
Basophils Relative: 0
Basophils Relative: 1
Eosinophils Absolute: 0 — ABNORMAL LOW
Eosinophils Absolute: 0.1 — ABNORMAL LOW
Eosinophils Absolute: 0.1 — ABNORMAL LOW
Eosinophils Absolute: 0.1 — ABNORMAL LOW
Eosinophils Relative: 0
Eosinophils Relative: 1
Eosinophils Relative: 1
Eosinophils Relative: 2
Lymphocytes Relative: 12
Lymphocytes Relative: 15
Lymphocytes Relative: 24
Lymphocytes Relative: 8 — ABNORMAL LOW
Lymphs Abs: 1.1
Lymphs Abs: 1.5
Lymphs Abs: 0.7
Lymphs Abs: 1.4
Monocytes Absolute: 0.5
Monocytes Absolute: 0.6
Monocytes Absolute: 0.2
Monocytes Absolute: 0.4
Monocytes Relative: 5
Monocytes Relative: 6
Neutro Abs: 7.5
Neutro Abs: 8.2 — ABNORMAL HIGH
Neutro Abs: 7.5
Neutrophils Relative %: 78 — ABNORMAL HIGH
Neutrophils Relative %: 83 — ABNORMAL HIGH
Neutrophils Relative %: 91 — ABNORMAL HIGH

## 2010-10-13 LAB — COMPREHENSIVE METABOLIC PANEL
ALT: 11
ALT: 18
ALT: 10
ALT: 13
AST: 22
AST: 13
AST: 21
AST: 21
AST: 24
Albumin: 3.6
Albumin: 3.7
Albumin: 3.4 — ABNORMAL LOW
Albumin: 3.6
Alkaline Phosphatase: 111
Alkaline Phosphatase: 132 — ABNORMAL HIGH
BUN: 1 — ABNORMAL LOW
BUN: 3 — ABNORMAL LOW
BUN: 4 — ABNORMAL LOW
CO2: 26
CO2: 20
CO2: 21
CO2: 25
Calcium: 9
Calcium: 9.2
Calcium: 8.6
Calcium: 9.1
Calcium: 9.3
Chloride: 105
Chloride: 105
Chloride: 108
Chloride: 109
Chloride: 111
Creatinine, Ser: 0.63
Creatinine, Ser: 0.56
Creatinine, Ser: 0.57
Creatinine, Ser: 0.69
GFR calc Af Amer: >60
GFR calc Af Amer: >60
GFR calc Af Amer: >60
GFR calc Af Amer: >60
GFR calc Af Amer: >60
GFR calc non Af Amer: >60
GFR calc non Af Amer: >60
GFR calc non Af Amer: >60
GFR calc non Af Amer: >60
Glucose, Bld: 103 — ABNORMAL HIGH
Potassium: 3.2 — ABNORMAL LOW
Potassium: 3.5
Potassium: 3.3 — ABNORMAL LOW
Sodium: 139
Sodium: 141
Sodium: 138
Sodium: 141
Total Bilirubin: 0.8
Total Bilirubin: 0.5
Total Bilirubin: 0.5
Total Bilirubin: 0.7
Total Bilirubin: 1.1
Total Protein: 6.3
Total Protein: 7

## 2010-10-13 LAB — URINE MICROSCOPIC-ADD ON

## 2010-10-13 LAB — CBC
HCT: 38.6
HCT: 37
HCT: 45.5
Hemoglobin: 13.2
Hemoglobin: 15.7 — ABNORMAL HIGH
MCHC: 34.2
MCHC: 33.8
MCHC: 34.5
MCV: 88
MCV: 88.4
MCV: 86.4
MCV: 88.3
MCV: 88.4
Platelets: 219
Platelets: 179
Platelets: 180
Platelets: 199
RBC: 4.39
RBC: 4.79
RBC: 4.19
RBC: 4.56
RBC: 4.99
RBC: 5.27 — ABNORMAL HIGH
RDW: 14.5
RDW: 13.1
WBC: 9.7
WBC: 9.8
WBC: 5.8
WBC: 6
WBC: 8.2
WBC: 8.5

## 2010-10-13 LAB — COMPREHENSIVE METABOLIC PANEL WITH GFR
AST: 15
BUN: 9
CO2: 26
Chloride: 106
Creatinine, Ser: 0.76
GFR calc Af Amer: >60
GFR calc non Af Amer: >60
Glucose, Bld: 100 — ABNORMAL HIGH
Total Bilirubin: 0.6

## 2010-10-13 LAB — SEDIMENTATION RATE: Sed Rate: 7

## 2010-10-13 LAB — CULTURE, BLOOD (ROUTINE X 2): Culture: NO GROWTH

## 2010-10-13 LAB — LIPASE, BLOOD
Lipase: 16
Lipase: 16
Lipase: 15
Lipase: 34

## 2010-10-13 LAB — URINE CULTURE: Culture: NO GROWTH

## 2010-10-14 LAB — DIFFERENTIAL
Basophils Absolute: 0.2 — ABNORMAL HIGH
Basophils Relative: 3 — ABNORMAL HIGH
Eosinophils Absolute: 0
Eosinophils Relative: 0
Lymphocytes Relative: 10 — ABNORMAL LOW
Lymphs Abs: 0.7
Monocytes Absolute: 0.1 — ABNORMAL LOW
Monocytes Relative: 2 — ABNORMAL LOW
Neutro Abs: 6
Neutrophils Relative %: 85 — ABNORMAL HIGH
Smear Review: ADEQUATE

## 2010-10-14 LAB — CBC
HCT: 43.4
Hemoglobin: 14.8
MCHC: 34.2
MCV: 87.8
Platelets: 204
RBC: 4.95
RDW: 12.5
WBC: 7

## 2010-10-14 LAB — URINALYSIS, ROUTINE W REFLEX MICROSCOPIC
Bilirubin Urine: NEGATIVE
Glucose, UA: NEGATIVE
Ketones, ur: NEGATIVE
Leukocytes, UA: NEGATIVE
Nitrite: NEGATIVE
Protein, ur: 100 — AB
Specific Gravity, Urine: 1.023
Urobilinogen, UA: 1
pH: 7.5

## 2010-10-14 LAB — COMPREHENSIVE METABOLIC PANEL
ALT: 13
AST: 24
Albumin: 3.7
CO2: 21
Chloride: 104
Creatinine, Ser: 0.51
GFR calc Af Amer: >60
GFR calc non Af Amer: >60
Potassium: 3.8
Sodium: 137
Total Bilirubin: 0.6

## 2010-10-14 LAB — LIPASE, BLOOD: Lipase: 18

## 2010-10-14 LAB — COMPREHENSIVE METABOLIC PANEL WITH GFR
Alkaline Phosphatase: 157 — ABNORMAL HIGH
BUN: 5 — ABNORMAL LOW
Calcium: 9.6
Glucose, Bld: 128 — ABNORMAL HIGH
Total Protein: 7

## 2010-10-14 LAB — URINE MICROSCOPIC-ADD ON

## 2010-10-14 LAB — ETHANOL: Alcohol, Ethyl (B): <5

## 2010-10-19 LAB — DIFFERENTIAL
Lymphocytes Relative: 8 — ABNORMAL LOW
Lymphs Abs: 0.8
Monocytes Relative: 7
Neutro Abs: 8.9 — ABNORMAL HIGH
Neutrophils Relative %: 85 — ABNORMAL HIGH

## 2010-10-19 LAB — CBC
HCT: 27.2 — ABNORMAL LOW
HCT: 28.5 — ABNORMAL LOW
Hemoglobin: 9.5 — ABNORMAL LOW
Hemoglobin: 9.7 — ABNORMAL LOW
MCHC: 33.8
MCHC: 34.2
MCV: 83.5
MCV: 85.1
MCV: 85.6
Platelets: 91 — ABNORMAL LOW
RBC: 3.33 — ABNORMAL LOW
RBC: 4.22
RDW: 15.6 — ABNORMAL HIGH
RDW: 19 — ABNORMAL HIGH
WBC: 10.5
WBC: 7.4

## 2010-10-19 LAB — BASIC METABOLIC PANEL
BUN: 9
BUN: <1 — ABNORMAL LOW
CO2: 27
CO2: 28
Calcium: 7.6 — ABNORMAL LOW
Calcium: 8 — ABNORMAL LOW
Calcium: 8.5
Chloride: 101
Chloride: 103
Creatinine, Ser: 0.47
Creatinine, Ser: 0.54
GFR calc Af Amer: >60
GFR calc Af Amer: >60
GFR calc non Af Amer: >60
Glucose, Bld: 106 — ABNORMAL HIGH
Glucose, Bld: 114 — ABNORMAL HIGH
Glucose, Bld: 93
Potassium: 3.1 — ABNORMAL LOW
Potassium: 3.9
Sodium: 134 — ABNORMAL LOW
Sodium: 135
Sodium: 138

## 2010-10-19 LAB — URINALYSIS, ROUTINE W REFLEX MICROSCOPIC
Ketones, ur: NEGATIVE
Nitrite: NEGATIVE
Specific Gravity, Urine: 1.008
pH: 6.5

## 2010-10-19 LAB — HEMOGLOBIN AND HEMATOCRIT, BLOOD: HCT: 28.8 — ABNORMAL LOW

## 2010-10-19 LAB — CROSSMATCH

## 2010-10-19 LAB — PROTIME-INR
INR: 0.9
INR: 1.1
INR: 1.7 — ABNORMAL HIGH
Prothrombin Time: 14.5

## 2010-10-19 LAB — MAGNESIUM: Magnesium: 1.7

## 2010-10-19 LAB — LIPASE, BLOOD: Lipase: 22

## 2010-10-21 ENCOUNTER — Ambulatory Visit (HOSPITAL_COMMUNITY)
Admission: RE | Admit: 2010-10-21 | Discharge: 2010-10-21 | Disposition: A | Discharge status: Home / Self Care | Payer: Medicaid Other | Source: Ambulatory Visit | Attending: Interventional Radiology | Admitting: Interventional Radiology

## 2010-10-21 ENCOUNTER — Ambulatory Visit: Payer: Medicaid Other

## 2010-10-21 ENCOUNTER — Other Ambulatory Visit (HOSPITAL_COMMUNITY): Payer: Medicaid Other

## 2010-10-21 ENCOUNTER — Telehealth: Payer: Self-pay | Admitting: Emergency Medicine

## 2010-10-21 ENCOUNTER — Other Ambulatory Visit (HOSPITAL_COMMUNITY): Payer: Self-pay | Admitting: Interventional Radiology

## 2010-10-21 DIAGNOSIS — N289 Disorder of kidney and ureter, unspecified: Secondary | ICD-10-CM | POA: Insufficient documentation

## 2010-10-21 DIAGNOSIS — N2889 Other specified disorders of kidney and ureter: Secondary | ICD-10-CM

## 2010-10-21 LAB — CBC
HCT: 38.6
MCV: 84.7
Platelets: 247
RBC: 4.55
WBC: 7.3

## 2010-10-21 LAB — COMPREHENSIVE METABOLIC PANEL
AST: 18
Albumin: 3.9
Alkaline Phosphatase: 118 — ABNORMAL HIGH
BUN: 9
CO2: 24
Chloride: 103
Creatinine, Ser: 0.73
GFR calc non Af Amer: >60
Potassium: 3.8
Total Bilirubin: 0.2 — ABNORMAL LOW

## 2010-10-21 LAB — BASIC METABOLIC PANEL
CO2: 31 mEq/L (ref 19–32)
Calcium: 10 mg/dL (ref 8.4–10.5)
Chloride: 100 mEq/L (ref 96–112)
Glucose, Bld: 95 mg/dL (ref 70–99)
Potassium: 4.4 mEq/L (ref 3.5–5.1)
Sodium: 137 mEq/L (ref 135–145)

## 2010-10-21 LAB — DIFFERENTIAL
Basophils Absolute: 0.1
Basophils Relative: 1
Eosinophils Relative: 1
Monocytes Absolute: 0.3
Neutro Abs: 4.9

## 2010-10-21 LAB — ETHANOL: Alcohol, Ethyl (B): 43 — ABNORMAL HIGH

## 2010-10-21 LAB — LIPASE, BLOOD: Lipase: 24

## 2010-10-21 NOTE — Telephone Encounter (Signed)
CONFIRMED ALL APPTS. W/ MIKE-HUSBAND  FOR PICC LINE, CT AND VISIT W/ DR Fredia Sorrow

## 2010-10-22 LAB — DIFFERENTIAL
Lymphocytes Relative: 12
Lymphs Abs: 1.1
Monocytes Absolute: 0.2
Monocytes Relative: 2 — ABNORMAL LOW
Neutro Abs: 8 — ABNORMAL HIGH
Neutrophils Relative %: 86 — ABNORMAL HIGH

## 2010-10-22 LAB — COMPREHENSIVE METABOLIC PANEL
Albumin: 4.2
BUN: 12
Calcium: 9.7
Glucose, Bld: 106 — ABNORMAL HIGH
Potassium: 4.4
Total Protein: 7.6

## 2010-10-22 LAB — CBC
HCT: 47.5 — ABNORMAL HIGH
Hemoglobin: 15.4 — ABNORMAL HIGH
MCHC: 32.4
Platelets: 269
RDW: 16.4 — ABNORMAL HIGH

## 2010-10-29 IMAGING — CR DG ABDOMEN ACUTE W/ 1V CHEST
3 series · 3 of 3 positions shown · non-contrast
Comparison: Chest dated 03/20/2008 and abdomen and pelvis CT dated
03/20/2008.

CLINICAL DATA: Smoker with abdominal pain, nausea and vomiting.

ACUTE ABDOMEN SERIES (ABDOMEN 2 VIEW & CHEST 1 VIEW)

[w chest pa]
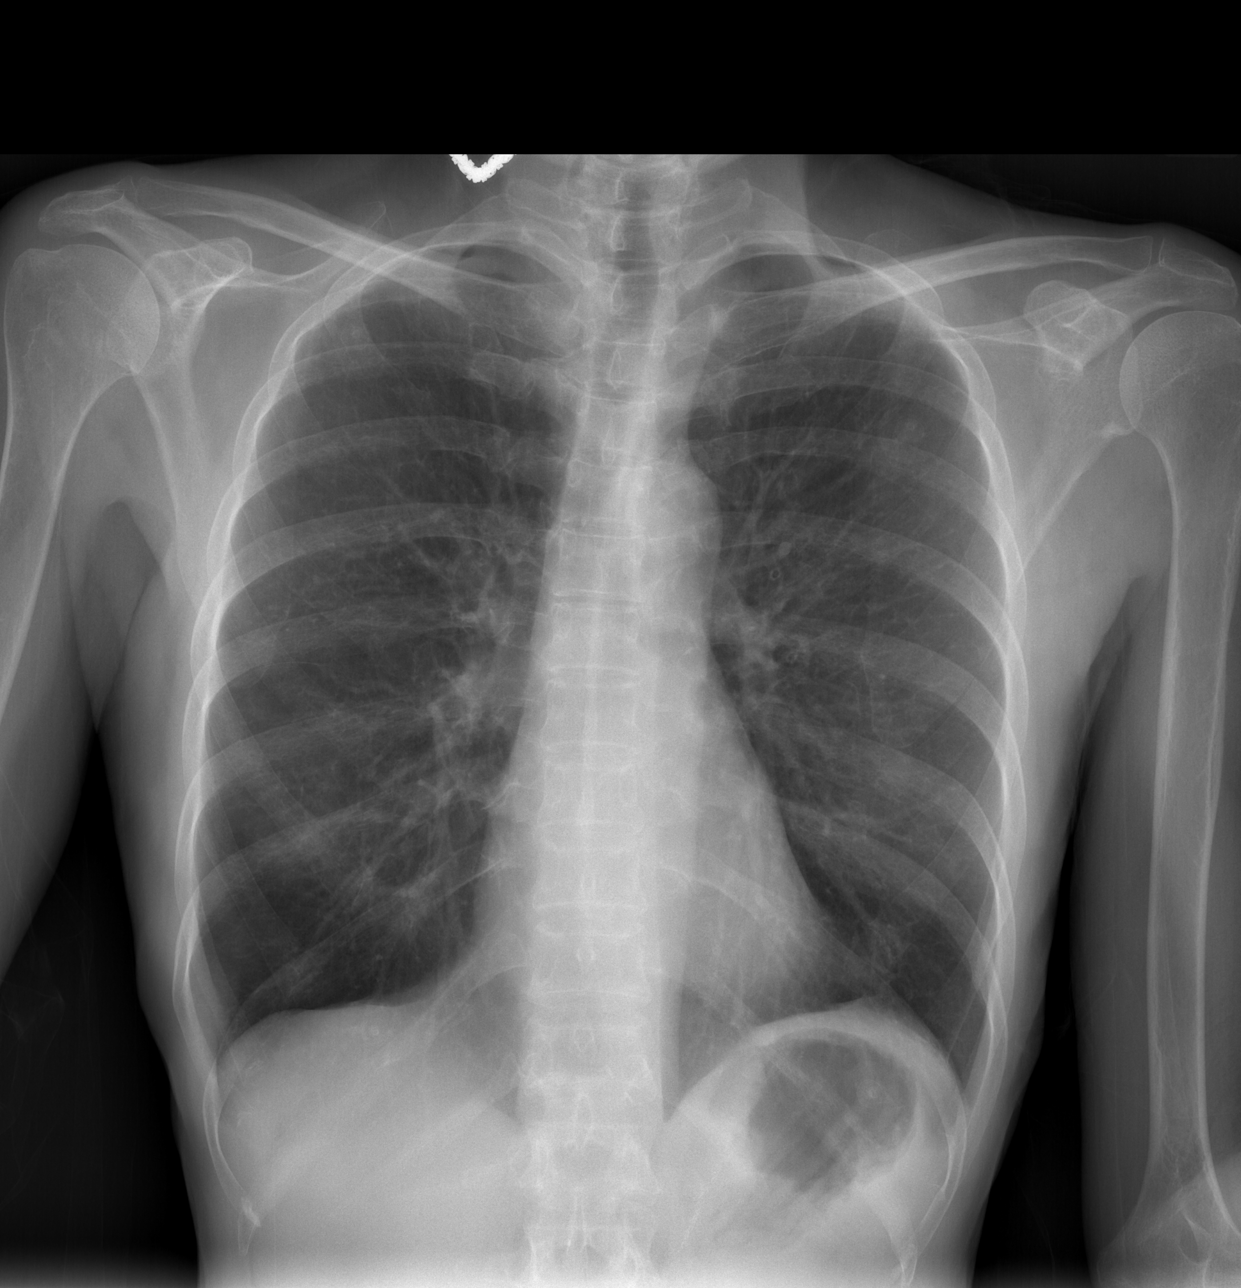

[w abdomen upright *]
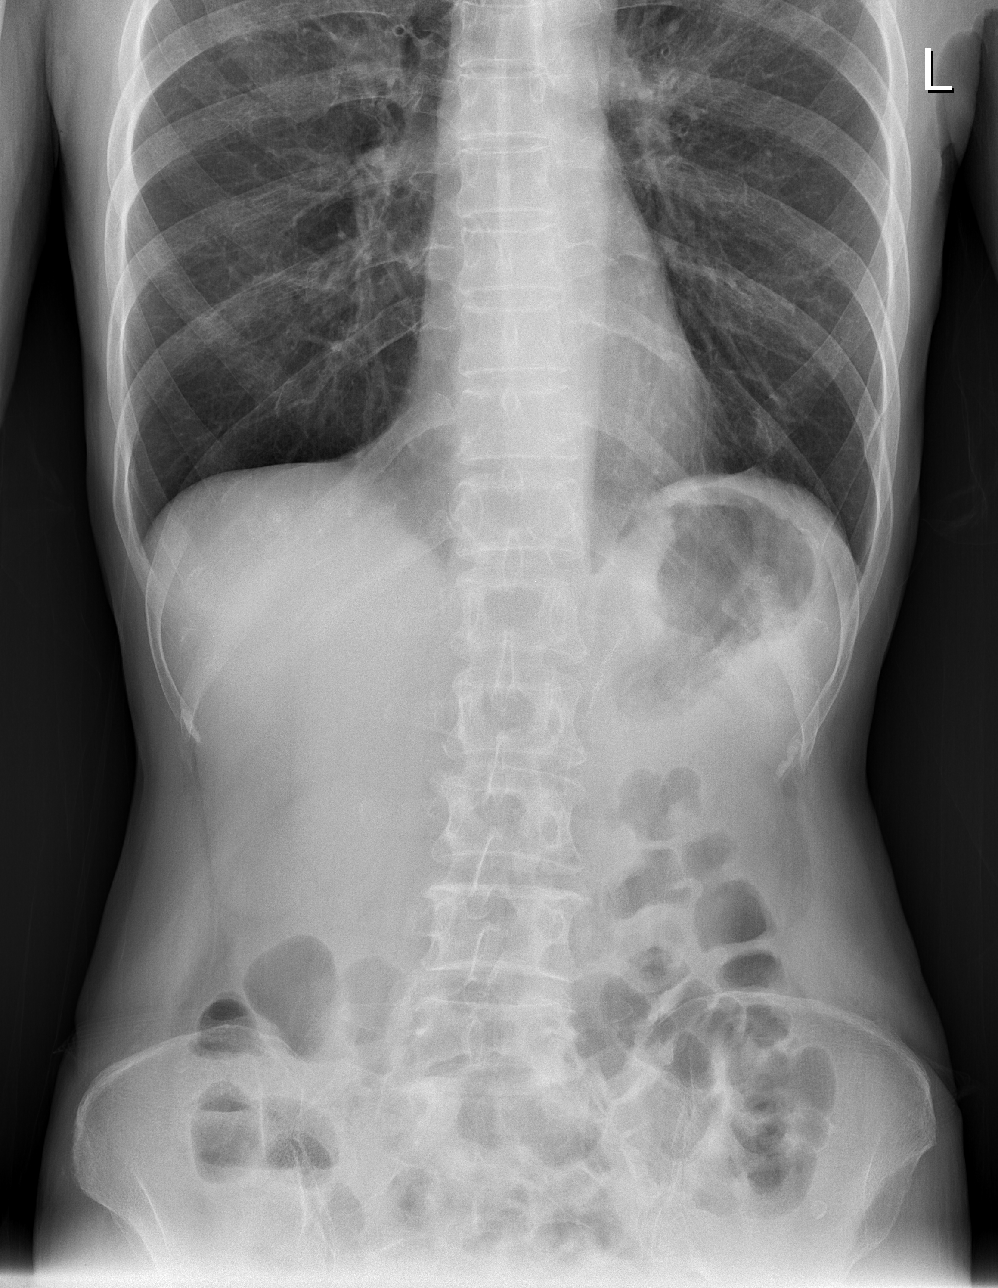

[t abdomen supine]
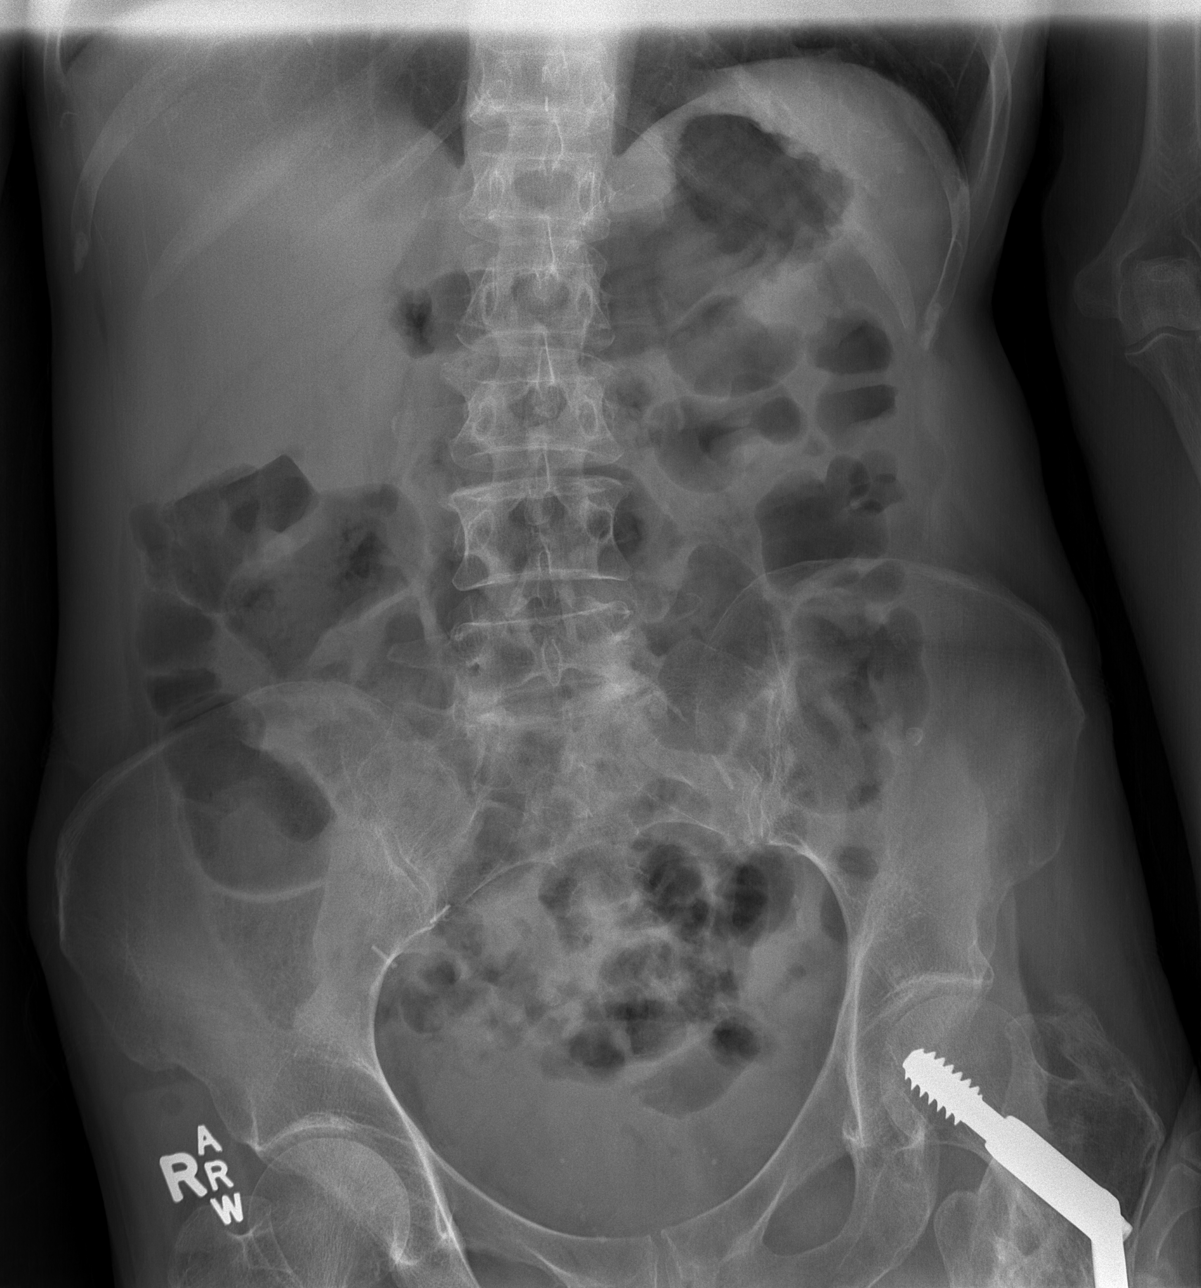

[3 of 3 positions shown; findings below may reference images not displayed]

FINDINGS: Normal sized heart.  Changes of COPD and chronic
bronchitis.  Normal bowel gas pattern without free peritoneal air.
Left upper abdominal surgical staples.  Mild scoliosis.  Diffuse
osteopenia.  Compression screw and plate fixation of an old left
intertrochanteric fracture.
IMPRESSION: 1.  No acute abnormality.
2.  Changes of COPD and chronic bronchitis.

## 2010-10-30 ENCOUNTER — Ambulatory Visit (HOSPITAL_COMMUNITY)
Admission: RE | Admit: 2010-10-30 | Discharge: 2010-10-30 | Disposition: A | Discharge status: Home / Self Care | Payer: Medicaid Other | Source: Ambulatory Visit | Attending: Interventional Radiology | Admitting: Interventional Radiology

## 2010-10-30 ENCOUNTER — Ambulatory Visit (HOSPITAL_COMMUNITY): Admission: RE | Admit: 2010-10-30 | Payer: Medicaid Other | Source: Ambulatory Visit

## 2010-10-30 ENCOUNTER — Other Ambulatory Visit (HOSPITAL_COMMUNITY): Payer: Self-pay | Admitting: Interventional Radiology

## 2010-10-30 DIAGNOSIS — N2889 Other specified disorders of kidney and ureter: Secondary | ICD-10-CM

## 2010-10-30 IMAGING — RF IR FLUORO GUIDE CV LINE*L*
1 series · 1 of 1 positions shown · non-contrast
Comparison: 

CLINICAL DATA: Abdominal pain, chest pain, acute on chronic
pancreatitis, prior history of renal cell carcinoma, poor venous
access; history of right axillary vein stenosis; request is made
for central venous access for fluids and medications.

LEFT UPPER EXTREMITY PICC PLACEMENT WITH ULTRASOUND AND FLUORO
GUIDANCE
TECHNIQUE: The left arm was prepped with chlorhexidine, draped in
the usual sterile fashion using maximum barrier technique and
infiltrated locally with 1% Lidocaine.  Ultrasound demonstrated
patency of the left basilic vein.  Under real-time ultrasound
guidance, this vein was accessed with a 21 gauge micropuncture
needle.  Ultrasound image documentation was performed.  The needle
was exchanged over a guidewire for a peel-away sheath through which
a 5 French single lumen PICC trimmed to 43cm was advanced,
positioned with its tip at the distal SVC/right atrial junction.
Fluoroscopy during the procedure and fluoro spot radiograph
confirms appropriate catheter position.  The catheter was flushed,
secured to the skin with Prolene sutures, and covered with a
sterile dressing.  No immediate complication.
Fluoroscopy Time: 1.2 minutes.

[Series 1: run · 1 of 1 slices shown]
[im 1/1]
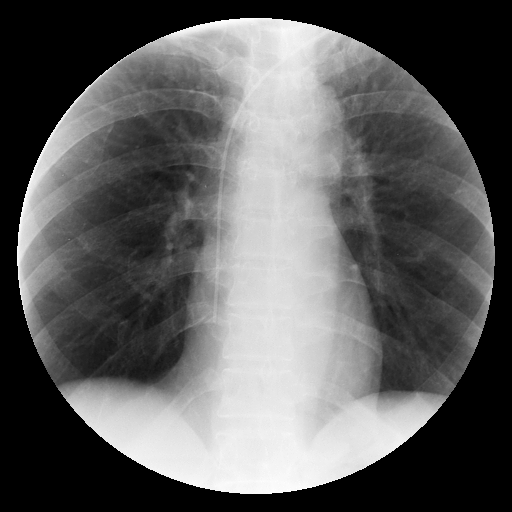

[1 of 1 positions shown; findings below may reference images not displayed]

IMPRESSION: Technically successful left arm PICC placement with ultrasound and
fluoroscopic guidance.  The catheter is ready for use.

Read by: Hecht, Nun.-BEGNAZAR

CENTRAL VENOUS CATHETER WITH FLUOROSCOPY,ULTRASOUND VENOUS ACCESS
FINDINGS: 
IMPRESSION:

## 2010-11-02 ENCOUNTER — Telehealth: Payer: Self-pay | Admitting: Emergency Medicine

## 2010-11-02 NOTE — Telephone Encounter (Signed)
LM FOR MIKE-HUSBAND, THAT I AM GOING TO CX VISIT FOR 11-10-10 SINCE THEY DID NOT SHOW UP FOR CT LAST WK.  4:00PM- Kathlene November called back and said that Dr. Fredia Sorrow got tied up in a case on Friday. He wants me to r/s for 1st thing in the morning at [facility]. I will work on it and call him back w/in the next day or so.

## 2010-11-03 ENCOUNTER — Telehealth: Payer: Self-pay | Admitting: Emergency Medicine

## 2010-11-03 ENCOUNTER — Other Ambulatory Visit (HOSPITAL_COMMUNITY): Payer: Self-pay | Admitting: Interventional Radiology

## 2010-11-03 DIAGNOSIS — N2889 Other specified disorders of kidney and ureter: Secondary | ICD-10-CM

## 2010-11-03 NOTE — Telephone Encounter (Signed)
LM FOR MIKE TO GO OVER [name]'S NEW APPTS.

## 2010-11-05 ENCOUNTER — Emergency Department (HOSPITAL_COMMUNITY)
Admission: EM | Admit: 2010-11-05 | Discharge: 2010-11-05 | Disposition: A | Discharge status: Home / Self Care | Payer: Medicaid Other | Attending: Emergency Medicine | Admitting: Emergency Medicine

## 2010-11-05 DIAGNOSIS — R112 Nausea with vomiting, unspecified: Secondary | ICD-10-CM | POA: Insufficient documentation

## 2010-11-05 DIAGNOSIS — R197 Diarrhea, unspecified: Secondary | ICD-10-CM | POA: Insufficient documentation

## 2010-11-05 DIAGNOSIS — R1011 Right upper quadrant pain: Secondary | ICD-10-CM | POA: Insufficient documentation

## 2010-11-05 DIAGNOSIS — Z85528 Personal history of other malignant neoplasm of kidney: Secondary | ICD-10-CM | POA: Insufficient documentation

## 2010-11-05 LAB — URINALYSIS, ROUTINE W REFLEX MICROSCOPIC
Glucose, UA: NEGATIVE mg/dL
Specific Gravity, Urine: 1.02 (ref 1.005–1.030)

## 2010-11-05 LAB — CBC
HCT: 41.9 % (ref 36.0–46.0)
Hemoglobin: 14.3 g/dL (ref 12.0–15.0)
MCH: 32.4 pg (ref 26.0–34.0)
RBC: 4.42 MIL/uL (ref 3.87–5.11)

## 2010-11-05 LAB — COMPREHENSIVE METABOLIC PANEL
ALT: 18 U/L (ref 0–35)
Alkaline Phosphatase: 122 U/L — ABNORMAL HIGH (ref 39–117)
BUN: 14 mg/dL (ref 6–23)
CO2: 23 mEq/L (ref 19–32)
Calcium: 10.5 mg/dL (ref 8.4–10.5)
GFR calc Af Amer: >90 mL/min (ref >90)
GFR calc non Af Amer: >90 mL/min (ref >90)
Glucose, Bld: 110 mg/dL — ABNORMAL HIGH (ref 70–99)
Sodium: 136 mEq/L (ref 135–145)

## 2010-11-05 LAB — DIFFERENTIAL
Basophils Relative: 0 % (ref 0–1)
Lymphocytes Relative: 12 % (ref 12–46)
Lymphs Abs: 0.9 10*3/uL (ref 0.7–4.0)
Monocytes Absolute: 0.4 10*3/uL (ref 0.1–1.0)
Monocytes Relative: 5 % (ref 3–12)
Neutro Abs: 6.1 10*3/uL (ref 1.7–7.7)
Neutrophils Relative %: 82 % — ABNORMAL HIGH (ref 43–77)

## 2010-11-05 LAB — URINE MICROSCOPIC-ADD ON

## 2010-11-05 LAB — LIPASE, BLOOD: Lipase: 24 U/L (ref 11–59)

## 2010-11-10 ENCOUNTER — Other Ambulatory Visit: Payer: Medicaid Other

## 2010-11-27 ENCOUNTER — Emergency Department (HOSPITAL_COMMUNITY)
Admission: EM | Admit: 2010-11-27 | Discharge: 2010-11-27 | Disposition: A | Discharge status: Home / Self Care | Payer: Medicaid Other | Attending: Emergency Medicine | Admitting: Emergency Medicine

## 2010-11-27 ENCOUNTER — Encounter (HOSPITAL_COMMUNITY): Payer: Self-pay | Admitting: *Deleted

## 2010-11-27 ENCOUNTER — Emergency Department (HOSPITAL_COMMUNITY): Payer: Medicaid Other

## 2010-11-27 ENCOUNTER — Other Ambulatory Visit (HOSPITAL_COMMUNITY): Payer: Self-pay | Admitting: Interventional Radiology

## 2010-11-27 ENCOUNTER — Ambulatory Visit (HOSPITAL_COMMUNITY)
Admission: RE | Admit: 2010-11-27 | Discharge: 2010-11-27 | Disposition: A | Discharge status: Home / Self Care | Payer: Medicaid Other | Source: Ambulatory Visit | Attending: Interventional Radiology | Admitting: Interventional Radiology

## 2010-11-27 ENCOUNTER — Other Ambulatory Visit: Payer: Self-pay

## 2010-11-27 ENCOUNTER — Other Ambulatory Visit (HOSPITAL_COMMUNITY): Payer: Medicaid Other

## 2010-11-27 DIAGNOSIS — F17200 Nicotine dependence, unspecified, uncomplicated: Secondary | ICD-10-CM | POA: Insufficient documentation

## 2010-11-27 DIAGNOSIS — R0602 Shortness of breath: Secondary | ICD-10-CM | POA: Insufficient documentation

## 2010-11-27 DIAGNOSIS — R509 Fever, unspecified: Secondary | ICD-10-CM | POA: Insufficient documentation

## 2010-11-27 DIAGNOSIS — K219 Gastro-esophageal reflux disease without esophagitis: Secondary | ICD-10-CM | POA: Insufficient documentation

## 2010-11-27 DIAGNOSIS — J449 Chronic obstructive pulmonary disease, unspecified: Secondary | ICD-10-CM | POA: Insufficient documentation

## 2010-11-27 DIAGNOSIS — C649 Malignant neoplasm of unspecified kidney, except renal pelvis: Secondary | ICD-10-CM | POA: Insufficient documentation

## 2010-11-27 DIAGNOSIS — IMO0001 Reserved for inherently not codable concepts without codable children: Secondary | ICD-10-CM | POA: Insufficient documentation

## 2010-11-27 DIAGNOSIS — N2889 Other specified disorders of kidney and ureter: Secondary | ICD-10-CM

## 2010-11-27 DIAGNOSIS — R109 Unspecified abdominal pain: Secondary | ICD-10-CM | POA: Insufficient documentation

## 2010-11-27 DIAGNOSIS — J4489 Other specified chronic obstructive pulmonary disease: Secondary | ICD-10-CM | POA: Insufficient documentation

## 2010-11-27 DIAGNOSIS — J189 Pneumonia, unspecified organism: Secondary | ICD-10-CM

## 2010-11-27 DIAGNOSIS — Z85528 Personal history of other malignant neoplasm of kidney: Secondary | ICD-10-CM | POA: Insufficient documentation

## 2010-11-27 DIAGNOSIS — Z79899 Other long term (current) drug therapy: Secondary | ICD-10-CM | POA: Insufficient documentation

## 2010-11-27 DIAGNOSIS — R52 Pain, unspecified: Secondary | ICD-10-CM | POA: Insufficient documentation

## 2010-11-27 DIAGNOSIS — R5381 Other malaise: Secondary | ICD-10-CM | POA: Insufficient documentation

## 2010-11-27 LAB — CBC
HCT: 37.7 % (ref 36.0–46.0)
MCV: 94.5 fL (ref 78.0–100.0)
RBC: 3.99 MIL/uL (ref 3.87–5.11)
WBC: 9.9 10*3/uL (ref 4.0–10.5)

## 2010-11-27 LAB — DIFFERENTIAL
Eosinophils Relative: 1 % (ref 0–5)
Lymphocytes Relative: 14 % (ref 12–46)
Lymphs Abs: 1.4 10*3/uL (ref 0.7–4.0)
Monocytes Absolute: 1.5 10*3/uL — ABNORMAL HIGH (ref 0.1–1.0)
Monocytes Relative: 15 % — ABNORMAL HIGH (ref 3–12)

## 2010-11-27 LAB — COMPREHENSIVE METABOLIC PANEL
ALT: 9 U/L (ref 0–35)
CO2: 26 mEq/L (ref 19–32)
Calcium: 9.5 mg/dL (ref 8.4–10.5)
Creatinine, Ser: 0.58 mg/dL (ref 0.50–1.10)
GFR calc Af Amer: >90 mL/min (ref >90)
GFR calc non Af Amer: >90 mL/min (ref >90)
Glucose, Bld: 88 mg/dL (ref 70–99)

## 2010-11-27 LAB — CARDIAC PANEL(CRET KIN+CKTOT+MB+TROPI)
CK, MB: 1.9 ng/mL (ref 0.3–4.0)
Total CK: 41 U/L (ref 7–177)

## 2010-11-27 MED ORDER — ONDANSETRON HCL 4 MG/2ML IJ SOLN
INTRAMUSCULAR | Status: AC
  Administered 2010-11-27: 4 mg
  Filled 2010-11-27: qty 2

## 2010-11-27 MED ORDER — DEXTROSE 5 % IV SOLN
1.0000 g | Freq: Once | INTRAVENOUS | Status: AC
  Administered 2010-11-27: 1 g via INTRAVENOUS
  Filled 2010-11-27: qty 10

## 2010-11-27 MED ORDER — HEPARIN SOD (PORK) LOCK FLUSH 100 UNIT/ML IV SOLN
250.0000 [IU] | INTRAVENOUS | Status: AC | PRN
  Administered 2010-11-27: 250 [IU]

## 2010-11-27 MED ORDER — IOHEXOL 300 MG/ML  SOLN
100.0000 mL | Freq: Once | INTRAMUSCULAR | Status: AC | PRN
  Administered 2010-11-27: 100 mL via INTRAVENOUS

## 2010-11-27 MED ORDER — HYDROMORPHONE HCL PF 2 MG/ML IJ SOLN
2.0000 mg | Freq: Once | INTRAMUSCULAR | Status: AC
  Administered 2010-11-27: 2 mg via INTRAVENOUS
  Filled 2010-11-27: qty 1

## 2010-11-27 MED ORDER — ONDANSETRON HCL 4 MG/2ML IJ SOLN
4.0000 mg | Freq: Once | INTRAMUSCULAR | Status: AC
  Administered 2010-11-27: 4 mg via INTRAVENOUS
  Filled 2010-11-27: qty 2

## 2010-11-27 MED ORDER — ONDANSETRON HCL 4 MG/2ML IJ SOLN: 4.0000 mg | Freq: Once | INTRAMUSCULAR | Status: DC

## 2010-11-27 MED ORDER — HYDROMORPHONE HCL 4 MG PO TABS: 4.0000 mg | ORAL_TABLET | ORAL | Status: DC | PRN

## 2010-11-27 MED ORDER — ONDANSETRON HCL 4 MG PO TABS: 4.0000 mg | ORAL_TABLET | Freq: Four times a day (QID) | ORAL | Status: DC

## 2010-11-27 MED ORDER — METOCLOPRAMIDE HCL 5 MG/ML IJ SOLN
10.0000 mg | Freq: Once | INTRAMUSCULAR | Status: AC
  Administered 2010-11-27: 10 mg via INTRAVENOUS
  Filled 2010-11-27: qty 2

## 2010-11-27 NOTE — ED Notes (Signed)
Pts signed d/c papers, pt states does have money for cab. Awaiting IV for PICC line flush and lock

## 2010-11-27 NOTE — ED Notes (Signed)
Pt st's she's had abdominal pain and nausea x2 weeks.  St's she's had pancreatitis in the past, st's she also has kidney cancer.  Denies any sob.  Breath and bowel sound clear, abdomen tender upon palpation.

## 2010-11-27 NOTE — ED Notes (Signed)
Pt's husband [name] called, left number for updates.  Number is [phone]

## 2010-11-27 NOTE — ED Provider Notes (Signed)
History     CSN: [id number] Arrival date & time: 11/27/2010  9:24 AM   First MD Initiated Contact with Patient 11/27/10 1002      Chief Complaint  Patient presents with  . Generalized Body Aches  . Gastrophageal Reflux  . Shortness of Breath    (Consider location/radiation/quality/duration/timing/severity/associated sxs/prior treatment) HPI Comments: Patient was at the hosptial today for placement of a PICC line for CT scan of abd and pelvis - presented to the ER because she reports a history of 10 days of chest pain, abdominal pain and body aches stating that this started after getting a flu shot 2 weeks ago - she reports that she has not been to see her PCP for this.  Patient is a 60 y.o. female presenting with GERD and shortness of breath. The history is provided by the patient. No language interpreter was used.  Gastrophageal Reflux This is a chronic problem. The current episode started 1 to 4 weeks ago. The problem occurs constantly. The problem has been unchanged. Associated symptoms include abdominal pain, anorexia, chest pain, chills, coughing, fatigue, a fever, myalgias and weakness. Pertinent negatives include no arthralgias, congestion, diaphoresis, headaches, joint swelling, nausea, numbness, sore throat, urinary symptoms or vomiting. The symptoms are aggravated by nothing. She has tried nothing for the symptoms. The treatment provided no relief.  Shortness of Breath  Associated symptoms include chest pain, a fever, cough and shortness of breath. Pertinent negatives include no sore throat.    Past Medical History  Diagnosis Date  . Cancer   . Pancreatitis   . COPD (chronic obstructive pulmonary disease)   . Substance abuse     History reviewed. No pertinent past surgical history.  History reviewed. No pertinent family history.  History  Substance Use Topics  . Smoking status: Current Everyday Smoker  . Smokeless tobacco: Not on file  . Alcohol Use: Yes     Hx of  Alcohol abuse    OB History    Grav Para Term Preterm Abortions TAB SAB Ect Mult Living                  Review of Systems  Constitutional: Positive for fever, chills and fatigue. Negative for diaphoresis.  HENT: Negative for congestion and sore throat.   Respiratory: Positive for cough and shortness of breath.   Cardiovascular: Positive for chest pain.  Gastrointestinal: Positive for abdominal pain and anorexia. Negative for nausea and vomiting.  Musculoskeletal: Positive for myalgias. Negative for joint swelling and arthralgias.  Neurological: Positive for weakness. Negative for numbness and headaches.  All other systems reviewed and are negative.    Allergies  Chlorpromazine hcl  Home Medications   Current Outpatient Rx  Name Route Sig Dispense Refill  . LORAZEPAM 1 MG PO TABS Oral Take 1 mg by mouth 4 (four) times daily.     Marland Kitchen ONDANSETRON HCL 4 MG PO TABS Oral Take 4 mg by mouth every 8 (eight) hours as needed.       BP 142/88  Pulse 100  Temp(Src) 98.8 F (37.1 C) (Oral)  Resp 20  SpO2 97%  Physical Exam  Nursing note and vitals reviewed. Constitutional: She is oriented to person, place, and time. She appears well-developed. She appears distressed.       cachetic  HENT:  Head: Normocephalic and atraumatic.  Right Ear: External ear normal.  Left Ear: External ear normal.  Mouth/Throat: Oropharynx is clear and moist.  Eyes: Conjunctivae are normal. Pupils are equal, round,  and reactive to light.  Neck: Normal range of motion. Neck supple.  Cardiovascular: Normal rate, regular rhythm, normal heart sounds and intact distal pulses.  Exam reveals no gallop and no friction rub.   No murmur heard. Pulmonary/Chest: Effort normal and breath sounds normal. No respiratory distress. She has no wheezes. She has no rales. She exhibits tenderness and bony tenderness. She exhibits no crepitus and no deformity.  Abdominal: Soft. Bowel sounds are normal. She exhibits no  distension. There is tenderness in the epigastric area and periumbilical area. There is guarding. There is no rigidity, no tenderness at McBurney's point and negative Murphy's sign.  Musculoskeletal: Normal range of motion.  Lymphadenopathy:    She has no cervical adenopathy.  Neurological: She is alert and oriented to person, place, and time. No cranial nerve deficit.  Skin: Skin is warm and dry. She is not diaphoretic.  Psychiatric: She has a normal mood and affect. Her behavior is normal. Judgment and thought content normal.    ED Course  Procedures (including critical care time)   Labs Reviewed  CBC  DIFFERENTIAL  COMPREHENSIVE METABOLIC PANEL  LIPASE, BLOOD  CARDIAC PANEL(CRET KIN+CKTOT+MB+TROPI)   Ir Fluoro Guide Cv Line Left  11/27/2010  *RADIOLOGY REPORT*  Clinical Data: The patient requires PICC line for IV contrast administration for CT scan.  She has a history of poor venous access and IV infiltration during CT with peripheral IV access.  CT is for follow-up of prior percutaneous ablation of a left renal carcinoma.  PICC LINE PLACEMENT WITH ULTRASOUND AND FLUOROSCOPIC  GUIDANCE  Fluoroscopy Time: 0.9 minutes.  The left arm was prepped with chlorhexidine, draped in the usual sterile fashion using maximum barrier technique (cap and mask, sterile gown, sterile gloves, large sterile sheet, hand hygiene and cutaneous antisepsis) and infiltrated locally with 1% Lidocaine.  Ultrasound demonstrated patency of the left brachial vein, and this was documented with an image.  Under real-time ultrasound guidance, this vein was accessed with a 21 gauge micropuncture needle and image documentation was performed.  The needle was exchanged over a guidewire for a peel-away sheath through which a 5 [place] single lumen PICC trimmed to 40 cm was advanced, positioned with its tip at the lower SVC/right atrial junction.  Fluoroscopy during the procedure and fluoro spot radiograph confirms appropriate  catheter position.  The catheter was flushed, secured to the skin with Prolene sutures, and covered with a sterile dressing.  Complications:  None  IMPRESSION: Successful left arm PICC line placement with ultrasound and fluoroscopic guidance.  The catheter is ready for use.  Original Report Authenticated By: Reola Calkins, M.D.   Ir US Guide Vasc Access Left  11/27/2010  *RADIOLOGY REPORT*  Clinical Data: The patient requires PICC line for IV contrast administration for CT scan.  She has a history of poor venous access and IV infiltration during CT with peripheral IV access.  CT is for follow-up of prior percutaneous ablation of a left renal carcinoma.  PICC LINE PLACEMENT WITH ULTRASOUND AND FLUOROSCOPIC  GUIDANCE  Fluoroscopy Time: 0.9 minutes.  The left arm was prepped with chlorhexidine, draped in the usual sterile fashion using maximum barrier technique (cap and mask, sterile gown, sterile gloves, large sterile sheet, hand hygiene and cutaneous antisepsis) and infiltrated locally with 1% Lidocaine.  Ultrasound demonstrated patency of the left brachial vein, and this was documented with an image.  Under real-time ultrasound guidance, this vein was accessed with a 21 gauge micropuncture needle and image documentation was performed.  The needle was exchanged over a guidewire for a peel-away sheath through which a 5 [place] single lumen PICC trimmed to 40 cm was advanced, positioned with its tip at the lower SVC/right atrial junction.  Fluoroscopy during the procedure and fluoro spot radiograph confirms appropriate catheter position.  The catheter was flushed, secured to the skin with Prolene sutures, and covered with a sterile dressing.  Complications:  None  IMPRESSION: Successful left arm PICC line placement with ultrasound and fluoroscopic guidance.  The catheter is ready for use.  Original Report Authenticated By: Reola Calkins, M.D.   Ct Abd Wo & W Cm  11/27/2010  *RADIOLOGY REPORT*  Clinical  Data: Recurrent left renal cell carcinoma.  5 months status post percutaneous cryoablation.  CT ABDOMEN WITHOUT AND WITH CONTRAST  Technique:  Multidetector CT imaging of the abdomen was performed following the standard protocol before and during bolus administration of intravenous contrast.  Contrast: OMNIPAQUE IOHEXOL 300 MG/ML IV SOLN  Comparison: 03/10/2010  Findings: Expected appearance of low attenuation cryoablation defect is seen in the medial lower pole of the left kidney.  There is no evidence of residual hypervascular neoplasm at the ablation site.  No other masses are seen involving the left kidney.  The right kidney remains normal in appearance.  There is no evidence of hydronephrosis.  Mild biliary ductal dilatation remains stable.  Gallbladder is unremarkable.  No evidence of pancreatic mass or pancreatic ductal dilatation.  No liver masses are identified.  The spleen and adrenal glands are also normal in appearance.  There is no evidence of retroperitoneal or other abdominal lymphadenopathy.  No evidence of inflammatory process or abnormal fluid collections within the abdomen.  Visualized portions of the lung bases are clear.  IMPRESSION:  1.  Expected appearance of cryoablation defect in the medial lower pole of the left kidney.  No evidence of residual or recurrent neoplasm. 2.  No evidence of metastatic disease or other acute findings.  Original Report Authenticated By: Danae Orleans, M.D.    Date: 11/27/2010  Rate: 93  Rhythm: normal sinus rhythm  QRS Axis: normal  Intervals: normal  ST/T Wave abnormalities: nonspecific T wave changes  Conduction Disutrbances:none  Narrative Interpretation: Reviewed by Dr. Fredricka Bonine, Donnie [place]' in V1 or V2, likely normal variant  Old EKG Reviewed: unchanged   No diagnosis found.    MDM  Review of CT scan reveals no metastatic disease, will get labs and chest x-ray though I doubt this to be anything more than exacerbation of her chronic  pain.      Chest x-ray reveals diffuse opacities overlying the chronic COPD, likely CAP, will treat as such, patient with normal oxygen sats and is able to take oral tablets - also reports that she has only "three" of the oral dilaudid tablets - will plan to prescribe short course until she can see Dr. Ronne Binning, her PCP  Izola Price. [place], [place] 11/27/10 1531

## 2010-11-27 NOTE — ED Notes (Signed)
Pt brought over to ed pt ir. Pt was coming to have picc line placed today. Pt states she is having decreased appetite.  Pt states she is having body aches. Pt has multiple complaints

## 2010-11-27 NOTE — ED Notes (Signed)
Patient is resting comfortably. 

## 2010-11-29 NOTE — ED Provider Notes (Signed)
Evaluation and management procedures were performed by the PA/NP under my supervision/collaboration.   Shana Zavaleta D Abundio Teuscher, MD 11/29/10 1720 

## 2010-12-01 ENCOUNTER — Emergency Department (HOSPITAL_COMMUNITY): Payer: Medicaid Other

## 2010-12-01 ENCOUNTER — Encounter (HOSPITAL_COMMUNITY): Payer: Self-pay | Admitting: Emergency Medicine

## 2010-12-01 ENCOUNTER — Inpatient Hospital Stay (HOSPITAL_COMMUNITY)
Admission: EM | Admit: 2010-12-01 | Discharge: 2010-12-04 | DRG: 193 | Disposition: A | Discharge status: Home / Self Care | Payer: Medicaid Other | Attending: Internal Medicine | Admitting: Internal Medicine

## 2010-12-01 ENCOUNTER — Other Ambulatory Visit: Payer: Self-pay

## 2010-12-01 DIAGNOSIS — Z85528 Personal history of other malignant neoplasm of kidney: Secondary | ICD-10-CM

## 2010-12-01 DIAGNOSIS — J189 Pneumonia, unspecified organism: Principal | ICD-10-CM | POA: Diagnosis present

## 2010-12-01 DIAGNOSIS — K861 Other chronic pancreatitis: Secondary | ICD-10-CM | POA: Diagnosis present

## 2010-12-01 DIAGNOSIS — K219 Gastro-esophageal reflux disease without esophagitis: Secondary | ICD-10-CM | POA: Diagnosis present

## 2010-12-01 DIAGNOSIS — J441 Chronic obstructive pulmonary disease with (acute) exacerbation: Secondary | ICD-10-CM | POA: Diagnosis present

## 2010-12-01 DIAGNOSIS — F17200 Nicotine dependence, unspecified, uncomplicated: Secondary | ICD-10-CM | POA: Diagnosis present

## 2010-12-01 DIAGNOSIS — E876 Hypokalemia: Secondary | ICD-10-CM | POA: Diagnosis not present

## 2010-12-01 DIAGNOSIS — G894 Chronic pain syndrome: Secondary | ICD-10-CM | POA: Diagnosis present

## 2010-12-01 DIAGNOSIS — K7460 Unspecified cirrhosis of liver: Secondary | ICD-10-CM | POA: Diagnosis present

## 2010-12-01 DIAGNOSIS — K859 Acute pancreatitis without necrosis or infection, unspecified: Secondary | ICD-10-CM | POA: Diagnosis present

## 2010-12-01 DIAGNOSIS — F411 Generalized anxiety disorder: Secondary | ICD-10-CM | POA: Diagnosis present

## 2010-12-01 LAB — DIFFERENTIAL
Basophils Absolute: 0 10*3/uL (ref 0.0–0.1)
Basophils Relative: 0 % (ref 0–1)
Eosinophils Relative: 2 % (ref 0–5)
Lymphocytes Relative: 14 % (ref 12–46)
Monocytes Absolute: 0.8 10*3/uL (ref 0.1–1.0)
Monocytes Relative: 8 % (ref 3–12)

## 2010-12-01 LAB — BLOOD GAS, ARTERIAL
Acid-base deficit: 5.3 mmol/L — ABNORMAL HIGH (ref 0.0–2.0)
Bicarbonate: 18.8 mEq/L — ABNORMAL LOW (ref 20.0–24.0)
Drawn by: 340271
FIO2: 0.28 %
O2 Saturation: 97.4 %
Patient temperature: 98.6
TCO2: 17.4 mmol/L (ref 0–100)
pCO2 arterial: 33.9 mmHg — ABNORMAL LOW (ref 35.0–45.0)
pH, Arterial: 7.364 (ref 7.350–7.400)
pO2, Arterial: 112 mmHg — ABNORMAL HIGH (ref 80.0–100.0)

## 2010-12-01 LAB — COMPREHENSIVE METABOLIC PANEL
AST: 28 U/L (ref 0–37)
BUN: 6 mg/dL (ref 6–23)
CO2: 27 mEq/L (ref 19–32)
Calcium: 9.6 mg/dL (ref 8.4–10.5)
Creatinine, Ser: 0.63 mg/dL (ref 0.50–1.10)
GFR calc non Af Amer: >90 mL/min (ref >90)
Total Bilirubin: 0.1 mg/dL — ABNORMAL LOW (ref 0.3–1.2)

## 2010-12-01 LAB — CBC
HCT: 38.2 % (ref 36.0–46.0)
Hemoglobin: 13.1 g/dL (ref 12.0–15.0)
MCHC: 34.3 g/dL (ref 30.0–36.0)
MCV: 95 fL (ref 78.0–100.0)
RDW: 12.9 % (ref 11.5–15.5)

## 2010-12-01 LAB — LIPASE, BLOOD: Lipase: 44 U/L (ref 11–59)

## 2010-12-01 MED ORDER — HYDROMORPHONE HCL PF 2 MG/ML IJ SOLN
INTRAMUSCULAR | Status: AC
  Filled 2010-12-01: qty 1

## 2010-12-01 MED ORDER — HYDROMORPHONE HCL PF 1 MG/ML IJ SOLN
1.0000 mg | Freq: Once | INTRAMUSCULAR | Status: DC
  Filled 2010-12-01 (×2): qty 1

## 2010-12-01 MED ORDER — DEXTROSE 5 % IV SOLN: 500.0000 mg | INTRAVENOUS | Status: DC

## 2010-12-01 MED ORDER — HYDROCODONE-ACETAMINOPHEN 10-325 MG PO TABS
1.0000 | ORAL_TABLET | Freq: Four times a day (QID) | ORAL | Status: DC | PRN
  Administered 2010-12-01 – 2010-12-03 (×4): 1 via ORAL
  Filled 2010-12-01 (×5): qty 1

## 2010-12-01 MED ORDER — DEXTROSE 5 % IV SOLN
500.0000 mg | INTRAVENOUS | Status: DC
  Administered 2010-12-02 – 2010-12-03 (×2): 500 mg via INTRAVENOUS
  Filled 2010-12-01 (×3): qty 500

## 2010-12-01 MED ORDER — NICOTINE 21 MG/24HR TD PT24
21.0000 mg | MEDICATED_PATCH | Freq: Every day | TRANSDERMAL | Status: DC
  Administered 2010-12-01 – 2010-12-04 (×4): 21 mg via TRANSDERMAL
  Filled 2010-12-01 (×5): qty 1

## 2010-12-01 MED ORDER — ONDANSETRON HCL 4 MG/2ML IJ SOLN
4.0000 mg | Freq: Once | INTRAMUSCULAR | Status: AC
  Administered 2010-12-01 (×2): 4 mg via INTRAVENOUS
  Filled 2010-12-01: qty 2

## 2010-12-01 MED ORDER — DEXTROSE 5 % IV SOLN
500.0000 mg | Freq: Once | INTRAVENOUS | Status: AC
  Administered 2010-12-01: 500 mg via INTRAVENOUS
  Filled 2010-12-01: qty 500

## 2010-12-01 MED ORDER — HYDROMORPHONE HCL PF 1 MG/ML IJ SOLN: 1.0000 mg | Freq: Once | INTRAMUSCULAR | Status: DC

## 2010-12-01 MED ORDER — HYDROMORPHONE HCL PF 2 MG/ML IJ SOLN
INTRAMUSCULAR | Status: AC
  Administered 2010-12-01: 1 mg
  Filled 2010-12-01: qty 1

## 2010-12-01 MED ORDER — LORAZEPAM 1 MG PO TABS
1.0000 mg | ORAL_TABLET | Freq: Three times a day (TID) | ORAL | Status: DC
  Administered 2010-12-01 – 2010-12-04 (×8): 1 mg via ORAL
  Filled 2010-12-01 (×9): qty 1

## 2010-12-01 MED ORDER — ALBUTEROL SULFATE (5 MG/ML) 0.5% IN NEBU
2.5000 mg | INHALATION_SOLUTION | Freq: Once | RESPIRATORY_TRACT | Status: AC
  Administered 2010-12-01: 2.5 mg via RESPIRATORY_TRACT
  Filled 2010-12-01 (×2): qty 0.5

## 2010-12-01 MED ORDER — DEXTROSE 5 % IV SOLN
1.0000 g | INTRAVENOUS | Status: DC
  Administered 2010-12-02 – 2010-12-03 (×2): 1 g via INTRAVENOUS
  Filled 2010-12-01 (×3): qty 10

## 2010-12-01 MED ORDER — ONDANSETRON HCL 4 MG/2ML IJ SOLN
4.0000 mg | Freq: Four times a day (QID) | INTRAMUSCULAR | Status: DC | PRN
  Administered 2010-12-01 – 2010-12-02 (×2): 4 mg via INTRAVENOUS
  Filled 2010-12-01 (×2): qty 2

## 2010-12-01 MED ORDER — LORAZEPAM 2 MG/ML IJ SOLN
1.0000 mg | Freq: Once | INTRAMUSCULAR | Status: AC
  Administered 2010-12-01: 1 mg via INTRAVENOUS

## 2010-12-01 MED ORDER — ALBUTEROL SULFATE (5 MG/ML) 0.5% IN NEBU
2.5000 mg | INHALATION_SOLUTION | Freq: Once | RESPIRATORY_TRACT | Status: AC
  Administered 2010-12-01: 2.5 mg via RESPIRATORY_TRACT
  Filled 2010-12-01: qty 1

## 2010-12-01 MED ORDER — HYDROMORPHONE HCL PF 1 MG/ML IJ SOLN
1.0000 mg | Freq: Once | INTRAMUSCULAR | Status: AC
  Administered 2010-12-01: 1 mg via INTRAVENOUS

## 2010-12-01 MED ORDER — HYDROMORPHONE HCL 4 MG PO TABS
4.0000 mg | ORAL_TABLET | Freq: Four times a day (QID) | ORAL | Status: DC | PRN
  Administered 2010-12-01 – 2010-12-02 (×3): 4 mg via ORAL
  Filled 2010-12-01 (×3): qty 2

## 2010-12-01 MED ORDER — HYDROMORPHONE HCL PF 2 MG/ML IJ SOLN
INTRAMUSCULAR | Status: AC
  Administered 2010-12-01: 1 mg via INTRAVENOUS
  Filled 2010-12-01: qty 1

## 2010-12-01 MED ORDER — DEXTROSE 5 % IV SOLN
1.0000 g | Freq: Once | INTRAVENOUS | Status: AC
  Administered 2010-12-01: 1 g via INTRAVENOUS
  Filled 2010-12-01: qty 10

## 2010-12-01 MED ORDER — SODIUM CHLORIDE 0.9 % IV BOLUS (SEPSIS)
1000.0000 mL | Freq: Once | INTRAVENOUS | Status: AC
  Administered 2010-12-01: 1000 mL via INTRAVENOUS

## 2010-12-01 MED ORDER — ONDANSETRON HCL 4 MG/2ML IJ SOLN
4.0000 mg | Freq: Once | INTRAMUSCULAR | Status: AC
  Administered 2010-12-01: 4 mg via INTRAVENOUS
  Filled 2010-12-01: qty 2

## 2010-12-01 MED ORDER — ONDANSETRON HCL 4 MG/2ML IJ SOLN
INTRAMUSCULAR | Status: AC
  Administered 2010-12-01: 4 mg via INTRAVENOUS
  Filled 2010-12-01: qty 2

## 2010-12-01 MED ORDER — ALBUTEROL SULFATE (5 MG/ML) 0.5% IN NEBU
5.0000 mg | INHALATION_SOLUTION | Freq: Once | RESPIRATORY_TRACT | Status: DC
  Filled 2010-12-01: qty 0.5

## 2010-12-01 MED ORDER — METHYLPREDNISOLONE SODIUM SUCC 125 MG IJ SOLR
125.0000 mg | Freq: Once | INTRAMUSCULAR | Status: AC
  Administered 2010-12-01: 125 mg via INTRAVENOUS
  Filled 2010-12-01: qty 2

## 2010-12-01 MED ORDER — LORAZEPAM 2 MG/ML IJ SOLN
INTRAMUSCULAR | Status: AC
  Administered 2010-12-01: 1 mg via INTRAVENOUS
  Filled 2010-12-01: qty 1

## 2010-12-01 MED ORDER — DEXTROSE 5 % IV SOLN: 1.0000 g | INTRAVENOUS | Status: DC

## 2010-12-01 NOTE — H&P (Signed)
Hospital Admission Note Date: 12/01/2010  Patient name: [name] Medical record number: [id number] Date of birth: 1950-05-18 Age: 60 y.o. Gender: female PCP: Billee Cashing, MD  Chief Complaint: shortness of breath and chest pain while coughing.  History of Present Illness: Mrs.  [name] is a 60 year old woman who presents to the ER today with complaints of shortness of breath and pain in her chest. The chest hurts when she takes a deep breath or coughs. She is experiencing a productive cough. The patient first started having symptoms 4 days ago. He presented to the emergency room at that time was diagnosed with pneumonia she was discharged but has continued to get worse. She says she is having subjective fevers. She is wheezing and has worse shortness of breath. Her cough is productive of white sputum. She feels like she is getting worse.  Meds: Medications Prior to Admission  Medication Dose Route Frequency Provider Last Rate Last Dose  . albuterol (PROVENTIL) (5 MG/ML) 0.5% nebulizer solution 2.5 mg  2.5 mg Nebulization Once Gerhard Munch, MD   2.5 mg at 12/01/10 0921  . albuterol (PROVENTIL) (5 MG/ML) 0.5% nebulizer solution 2.5 mg  2.5 mg Nebulization Once Gerhard Munch, MD   2.5 mg at 12/01/10 1129  . albuterol (PROVENTIL) (5 MG/ML) 0.5% nebulizer solution 5 mg  5 mg Nebulization Once Gerhard Munch, MD      . azithromycin (ZITHROMAX) 500 mg in dextrose 5 % 250 mL IVPB  500 mg Intravenous Once Gerhard Munch, MD   500 mg at 12/01/10 0931  . cefTRIAXone (ROCEPHIN) 1 g in dextrose 5 % 50 mL IVPB  1 g Intravenous Once Gerhard Munch, MD      . HYDROmorphone (DILAUDID) 2 MG/ML injection           . HYDROmorphone (DILAUDID) 2 MG/ML injection        1 mg at 12/01/10 0947  . HYDROmorphone (DILAUDID) 2 MG/ML injection        1 mg at 12/01/10 1216  . HYDROmorphone (DILAUDID) injection 1 mg  1 mg Intravenous Once Gerhard Munch, MD   1 mg at 12/01/10 0906  . LORazepam (ATIVAN)  injection 1 mg  1 mg Intravenous Once Gerhard Munch, MD   1 mg at 12/01/10 1009  . methylPREDNISolone sodium succinate (SOLU-MEDROL) 125 MG injection 125 mg  125 mg Intravenous Once Gerhard Munch, MD   125 mg at 12/01/10 0905  . ondansetron (ZOFRAN) injection 4 mg  4 mg Intravenous Once Gerhard Munch, MD   4 mg at 12/01/10 1014  . ondansetron (ZOFRAN) injection 4 mg  4 mg Intravenous Once Gerhard Munch, MD   4 mg at 12/01/10 1216  . sodium chloride 0.9 % bolus 1,000 mL  1,000 mL Intravenous Once Gerhard Munch, MD   1,000 mL at 12/01/10 0903  . DISCONTD: HYDROmorphone (DILAUDID) injection 1 mg  1 mg Intravenous Once Gerhard Munch, MD      . DISCONTD: HYDROmorphone (DILAUDID) injection 1 mg  1 mg Intravenous Once Gerhard Munch, MD       Medications Prior to Admission  Medication Sig Dispense Refill  . albuterol (PROVENTIL HFA;VENTOLIN HFA) 108 (90 BASE) MCG/ACT inhaler Inhale 2 puffs into the lungs every 4 (four) hours as needed. For shortness of breath.       Marland Kitchen HYDROmorphone (DILAUDID) 4 MG tablet Take 4 mg by mouth every 6 (six) hours as needed. For pain.       Marland Kitchen LORazepam (ATIVAN) 1 MG tablet Take 1  mg by mouth every 6 (six) hours as needed. For anxiety.      . ondansetron (ZOFRAN) 4 MG tablet Take 4 mg by mouth every 8 (eight) hours as needed. For nausea.       The patient also tells me she takes Vicodin in between Dilaudid for break through pain when necessary.  Allergies: Chlorpromazine hcl Past Medical History  Diagnosis Date  . Cancer   . Pancreatitis   . COPD (chronic obstructive pulmonary disease)   . Substance abuse    Past Surgical History  Procedure Date  . Partial gastrectomy    History reviewed. No pertinent family history. History   Social History  . Marital Status: Married    Spouse Name: N/A    Number of Children: N/A  . Years of Education: N/A   Occupational History  . Not on file.   Social History Main Topics  . Smoking status: Current  Everyday Smoker  . Smokeless tobacco: Not on file  . Alcohol Use: Yes     Hx of Alcohol abuse  . Drug Use: Yes     Hx of polysubstance drug abuse  . Sexually Active: No   Other Topics Concern  . Not on file   Social History Narrative  . No narrative on file   Review of Systems:  Review of Systems  Constitutional: Positive for malaise/fatigue and diaphoresis. Negative for fever and chills.  HENT: Positive for congestion. Negative for hearing loss, ear pain, nosebleeds, neck pain, tinnitus and ear discharge.   Eyes: Negative for photophobia, pain, discharge and redness.  Respiratory: Positive for cough, sputum production, shortness of breath and wheezing. Negative for hemoptysis and stridor.   Cardiovascular: Positive for chest pain. Negative for palpitations, orthopnea, claudication, leg swelling and PND.  Gastrointestinal: Positive for nausea, vomiting and abdominal pain. Negative for blood in stool and melena.  Genitourinary: Negative for dysuria, urgency, frequency and hematuria.  Musculoskeletal: Positive for back pain, joint pain and falls.  Skin: Positive for itching and rash.  Neurological: Positive for dizziness, tremors and headaches. Negative for tingling, sensory change, speech change, focal weakness, seizures, loss of consciousness and weakness.  Endo/Heme/Allergies: Negative for polydipsia.  Psychiatric/Behavioral: Negative for hallucinations and substance abuse. The patient is nervous/anxious.     Filed Vitals:   12/01/10 1131  BP: 125/76  Pulse: 114  Temp:   Resp: 18   SpO2 Readings from Last 3 Encounters:  12/01/10 100%  11/27/10 97%  08/02/10 97%   Physical Exam: Physical Exam  Vitals reviewed. Constitutional: She is oriented to person, place, and time. She appears well-developed. No distress.  HENT:  Head: Normocephalic and atraumatic.  Nose: Nose normal.  Eyes: Conjunctivae and EOM are normal. Pupils are equal, round, and reactive to light. Right eye  exhibits discharge. Left eye exhibits discharge. No scleral icterus.  Neck: Normal range of motion. Neck supple. No JVD present. No tracheal deviation present. No thyromegaly present.  Cardiovascular: Normal rate, normal heart sounds and intact distal pulses.  Exam reveals no gallop and no friction rub.   No murmur heard. Pulmonary/Chest: Effort normal. No stridor. She has wheezes. She exhibits tenderness.  Abdominal: Soft. Bowel sounds are normal. She exhibits no distension and no mass. There is tenderness. There is no rebound and no guarding.  Musculoskeletal: Normal range of motion. She exhibits no edema and no tenderness.  Lymphadenopathy:    She has no cervical adenopathy.  Neurological: She is alert and oriented to person, place, and time. She  has normal strength. She displays tremor. No cranial nerve deficit. She exhibits normal muscle tone. GCS eye subscore is 4. GCS verbal subscore is 5. GCS motor subscore is 6.       Rest tremor noted in feet most prominently, less in hands  Skin: Skin is warm and dry. No rash noted. She is not diaphoretic. No erythema. No pallor.  Psychiatric: She has a normal mood and affect. Her behavior is normal. Judgment and thought content normal.   Lab results: Results for orders placed during the hospital encounter of 12/01/10  CBC      Component Value Range   WBC 9.5  4.0 - 10.5 (K/uL)   RBC 4.02  3.87 - 5.11 (MIL/uL)   Hemoglobin 13.1  12.0 - 15.0 (g/dL)   HCT 16.1  09.6 - 04.5 (%)   MCV 95.0  78.0 - 100.0 (fL)   MCH 32.6  26.0 - 34.0 (pg)   MCHC 34.3  30.0 - 36.0 (g/dL)   RDW 40.9  81.1 - 91.4 (%)   Platelets 223  150 - 400 (K/uL)  DIFFERENTIAL      Component Value Range   Neutrophils Relative 75  43 - 77 (%)   Neutro Abs 7.1  1.7 - 7.7 (K/uL)   Lymphocytes Relative 14  12 - 46 (%)   Lymphs Abs 1.4  0.7 - 4.0 (K/uL)   Monocytes Relative 8  3 - 12 (%)   Monocytes Absolute 0.8  0.1 - 1.0 (K/uL)   Eosinophils Relative 2  0 - 5 (%)   Eosinophils  Absolute 0.2  0.0 - 0.7 (K/uL)   Basophils Relative 0  0 - 1 (%)   Basophils Absolute 0.0  0.0 - 0.1 (K/uL)  COMPREHENSIVE METABOLIC PANEL      Component Value Range   Sodium 137  135 - 145 (mEq/L)   Potassium 3.9  3.5 - 5.1 (mEq/L)   Chloride 101  96 - 112 (mEq/L)   CO2 27  19 - 32 (mEq/L)   Glucose, Bld 91  70 - 99 (mg/dL)   BUN 6  6 - 23 (mg/dL)   Creatinine, Ser 7.82  0.50 - 1.10 (mg/dL)   Calcium 9.6  8.4 - 95.6 (mg/dL)   Total Protein 7.2  6.0 - 8.3 (g/dL)   Albumin 3.0 (*) 3.5 - 5.2 (g/dL)   AST 28  0 - 37 (U/L)   ALT 15  0 - 35 (U/L)   Alkaline Phosphatase 106  39 - 117 (U/L)   Total Bilirubin 0.1 (*) 0.3 - 1.2 (mg/dL)   GFR calc non Af Amer >90  >90 (mL/min)   GFR calc Af Amer >90  >90 (mL/min)  LIPASE, BLOOD      Component Value Range   Lipase 44  11 - 59 (U/L)    Imaging results:  Dg Chest 2 View  12/01/2010  *RADIOLOGY REPORT*  Clinical Data: Shortness of breath and weakness.  CHEST - 2 VIEW  Comparison: 11/27/2010.  Findings: Tip of left PICC terminates in superior vena cava.  No pneumothorax is evident. The cardiac silhouette is normal size and shape.  Mediastinal and hilar contours appear stable.  There is slight chronic increase in the reticular interstitial markings and there is a mild hyperinflation configuration.  There is a new patchy infiltrate in the inferior lateral aspect of the right upper lobe.  No pleural effusion is evident.  There is slight tenting of the left hemidiaphragmatic pleura. Bones appear average for age.  IMPRESSION: PICC in place.  No pneumothorax.  There is slight chronic increase in the reticular interstitial markings and there is a mild hyperinflation configuration.  There is a new patchy infiltrate in the inferior lateral aspect of the right upper lobe. This may reflect atelectasis or small area of pneumonia.  Original Report Authenticated By: Crawford Givens, M.D.   Other results: The patient's EKG reveals normal sinus rhythm with a rate of 99.  He has biatrial abnormalities. PTs but no ST segment elevation or depression. I personally reviewed the EKG.  Assessment & Plan by Problem: #1. Pneumonia. The patient will be treated with IV Rocephin and Zithromax for community acquired pneumonia. Her O2 sats are 100% on HER-2 liters nasal cannula. She is slightly tachycardic I will check an ABG, I believe she is safe for the floor at this time.  #2. COPD exacerbation. The patient is still wheezing and has received several nebulizer treatments here in the emergency department here we will put her on steroids and continued nebulizer treatments to he only takes albuterol as an outpatient will likely be put on Combivent versus Spiriva at discharge.  #3. Narcotic usage. Patient denies any problem with addiction, but is very worried about missing any doses of her chronic narcotics and wants to be sure I put her on breakthrough Vicodin in addition to her schedule Dilaudid.  We will resume her outpatient medications once they are verified by the pharmacy in her medicine reconciliation, but do not plan to add additional narcotics.  #4 kidney cancer. The patient says she is currently in remission and has been for the last year she says her only treatment was that the cancer was "frozen". We have no further details about her kidney cancer.   #5 history of alcohol abuse and chronic pancreatitis.  The patient said she "used to be an alcoholic still has occasional bouts of pancreatitis not related to alcohol she reports. Will follow.  #6. Anxiety.  The patient's scheduled Ativan will be continued.  Code status: full code  Greater than 70 minutes was spent on direct patient care and coordination of care.  Please fax this note to primary provider  Jacori Mulrooney 12/01/2010, 1:00 PM

## 2010-12-01 NOTE — Discharge Planning (Signed)
ED CM received from Admission RN consult for possible need for [facility] Currently being seen by [employer] aide  CM spoke with pt who confirms she has a private duty aide that is already aware of the need for resumption of her services upon d/c Pt has made contact with aide personally CM does not need to assist

## 2010-12-01 NOTE — ED Notes (Signed)
Pt states was dx Friday with pneumonia, states is getting worse. Pt states pain in rib area and abd pain. Hx of pancreatitis as well.

## 2010-12-01 NOTE — ED Provider Notes (Signed)
History     CSN: [id number] Arrival date & time: 12/01/2010  6:10 AM   First MD Initiated Contact with Patient 12/01/10 0801      Chief Complaint  Patient presents with  . Pneumonia    dx last week     Patient is a 60 y.o. female presenting with pneumonia.  Pneumonia Associated symptoms include chest pain and shortness of breath.   Patient presents four days after a recent visit to our ED w persistent cough, sob, chest pain, and nausea / emesis.  She notes that her Sx began gradually ~10d prior to that presentation.  Since d/c she has not achieved sig relief w PO analgesia, antiemetics.  Her Sx are worse w activity.  No pleuritic pain, no exertional motion. Her CP is L sided sharp, focally about the inferior ribs, circumferential, now w radiation to the entire anterior chest. +fever, +chills.  No home ABX? Past Medical History  Diagnosis Date  . Cancer   . Pancreatitis   . COPD (chronic obstructive pulmonary disease)   . Substance abuse     History reviewed. No pertinent past surgical history.  History reviewed. No pertinent family history.  History  Substance Use Topics  . Smoking status: Current Everyday Smoker  . Smokeless tobacco: Not on file  . Alcohol Use: Yes     Hx of Alcohol abuse    OB History    Grav Para Term Preterm Abortions TAB SAB Ect Mult Living                  Review of Systems  Constitutional: Positive for fever, chills and fatigue.  HENT: Negative.   Eyes: Negative.   Respiratory: Positive for cough and shortness of breath.   Cardiovascular: Positive for chest pain.  Gastrointestinal: Positive for nausea and vomiting.  Genitourinary: Negative.   Musculoskeletal: Negative.   Skin: Negative.   Neurological: Negative.   Hematological: Negative.   Psychiatric/Behavioral: Negative.     Allergies  Chlorpromazine hcl  Home Medications   Current Outpatient Rx  Name Route Sig Dispense Refill  . ALBUTEROL SULFATE HFA 108 (90 BASE) MCG/ACT  IN AERS Inhalation Inhale 2 puffs into the lungs every 4 (four) hours as needed. For shortness of breath.     Marland Kitchen HYDROMORPHONE HCL 4 MG PO TABS Oral Take 4 mg by mouth every 6 (six) hours as needed. For pain.     Marland Kitchen LORAZEPAM 1 MG PO TABS Oral Take 1 mg by mouth every 6 (six) hours as needed. For anxiety.    Marland Kitchen ONDANSETRON HCL 4 MG PO TABS Oral Take 4 mg by mouth every 8 (eight) hours as needed. For nausea.      BP 126/92  Pulse 97  Temp(Src) 98.5 F (36.9 C) (Oral)  Resp 18  SpO2 96%  Physical Exam  Constitutional: She is oriented to person, place, and time. No distress.       Frail, older than stated age  HENT:  Head: Normocephalic and atraumatic.  Eyes: EOM are normal. Pupils are equal, round, and reactive to light.  Neck: No thyromegaly present.  Cardiovascular: Normal rate and regular rhythm.   Pulmonary/Chest:       Diffuse wheezing throughout  Abdominal: Soft. Bowel sounds are normal. She exhibits no distension.  Musculoskeletal: She exhibits no edema and no tenderness.  Neurological: She is alert and oriented to person, place, and time.  Skin: Skin is warm and dry.       L sided PICC  in place  Psychiatric: She has a normal mood and affect.    ED Course  Procedures (including critical care time)   Labs Reviewed  CBC  DIFFERENTIAL  COMPREHENSIVE METABOLIC PANEL  LIPASE, BLOOD   No results found.   No diagnosis found.   Date: 12/01/2010  Rate:99  Rhythm: normal sinus rhythm  QRS Axis: normal  Intervals: normal  ST/T Wave abnormalities: nonspecific T wave changes  Conduction Disutrbances:none  Narrative Interpretation:   Old EKG Reviewed: unchanged  Cxr: R ML infiltrate, viewed by me.  MDM  This 60 year old female with a history of renal cell carcinoma and COPD now presents with ongoing shortness of breath, weakness, cough. On exam the patient is uncomfortable appearing with diffuse bilateral wheezing. The patient's chest x-ray demonstrates right middle  lobe infiltrate consistent with pneumonia. Following Whipple nebulizer treatments, the patient remained uncomfortable with wheezing. Given her comorbidities she'll be admitted for further evaluation and management of this community acquired pneumonia.        Gerhard Munch, MD 12/01/10 1145

## 2010-12-01 NOTE — ED Notes (Signed)
Pt complaining of bilateral rib pain, explained delay in pain medication to family, awaiting pharmacy to tube medications after explaining to pharm tech that norco 10 no longer kept in tcu or ed pyxis, medication obtained after short delay and given to patient, will cont. To monitor

## 2010-12-02 ENCOUNTER — Encounter (HOSPITAL_COMMUNITY): Payer: Self-pay

## 2010-12-02 LAB — CBC
HCT: 34.2 % — ABNORMAL LOW (ref 36.0–46.0)
HCT: 35.6 % — ABNORMAL LOW (ref 36.0–46.0)
Hemoglobin: 11.4 g/dL — ABNORMAL LOW (ref 12.0–15.0)
Hemoglobin: 12.3 g/dL (ref 12.0–15.0)
MCH: 31.6 pg (ref 26.0–34.0)
MCH: 32.3 pg (ref 26.0–34.0)
MCHC: 33.3 g/dL (ref 30.0–36.0)
MCHC: 34.6 g/dL (ref 30.0–36.0)
MCV: 94.7 fL (ref 78.0–100.0)
MCV: 93.4 fL (ref 78.0–100.0)
Platelets: 201 10*3/uL (ref 150–400)
Platelets: 225 10*3/uL (ref 150–400)
RBC: 3.61 MIL/uL — ABNORMAL LOW (ref 3.87–5.11)
RBC: 3.81 MIL/uL — ABNORMAL LOW (ref 3.87–5.11)
RDW: 13 % (ref 11.5–15.5)
RDW: 13 % (ref 11.5–15.5)
WBC: 15.5 10*3/uL — ABNORMAL HIGH (ref 4.0–10.5)
WBC: 18.8 10*3/uL — ABNORMAL HIGH (ref 4.0–10.5)

## 2010-12-02 LAB — LIPASE, BLOOD: Lipase: 37 U/L (ref 11–59)

## 2010-12-02 LAB — BASIC METABOLIC PANEL
BUN: 8 mg/dL (ref 6–23)
CO2: 28 mEq/L (ref 19–32)
Calcium: 9.9 mg/dL (ref 8.4–10.5)
Chloride: 102 mEq/L (ref 96–112)
Creatinine, Ser: 0.6 mg/dL (ref 0.50–1.10)
GFR calc Af Amer: >90 mL/min (ref >90)
GFR calc non Af Amer: >90 mL/min (ref >90)
Glucose, Bld: 92 mg/dL (ref 70–99)
Potassium: 3.7 mEq/L (ref 3.5–5.1)
Sodium: 139 mEq/L (ref 135–145)

## 2010-12-02 LAB — CREATININE, SERUM
Creatinine, Ser: 0.62 mg/dL (ref 0.50–1.10)
GFR calc Af Amer: >90 mL/min (ref >90)
GFR calc non Af Amer: >90 mL/min (ref >90)

## 2010-12-02 MED ORDER — HYDROMORPHONE HCL PF 1 MG/ML IJ SOLN
1.0000 mg | INTRAMUSCULAR | Status: DC | PRN
  Administered 2010-12-02 – 2010-12-04 (×12): 1 mg via INTRAVENOUS
  Filled 2010-12-02 (×10): qty 1

## 2010-12-02 MED ORDER — ALBUTEROL SULFATE (5 MG/ML) 0.5% IN NEBU: 2.5000 mg | INHALATION_SOLUTION | RESPIRATORY_TRACT | Status: DC | PRN

## 2010-12-02 MED ORDER — PROMETHAZINE HCL 25 MG/ML IJ SOLN
12.5000 mg | Freq: Four times a day (QID) | INTRAMUSCULAR | Status: DC | PRN
  Administered 2010-12-02 – 2010-12-04 (×7): 12.5 mg via INTRAVENOUS
  Filled 2010-12-02 (×7): qty 1

## 2010-12-02 MED ORDER — SENNA 8.6 MG PO TABS
2.0000 | ORAL_TABLET | Freq: Every day | ORAL | Status: DC | PRN
  Filled 2010-12-02: qty 2

## 2010-12-02 MED ORDER — THERA M PLUS PO TABS
1.0000 | ORAL_TABLET | Freq: Every day | ORAL | Status: DC
  Administered 2010-12-03 – 2010-12-04 (×2): 1 via ORAL
  Filled 2010-12-02 (×3): qty 1

## 2010-12-02 MED ORDER — POTASSIUM CHLORIDE IN NACL 20-0.9 MEQ/L-% IV SOLN
INTRAVENOUS | Status: DC
  Filled 2010-12-02: qty 1000

## 2010-12-02 MED ORDER — FOLIC ACID 1 MG PO TABS
1.0000 mg | ORAL_TABLET | Freq: Every day | ORAL | Status: DC
  Administered 2010-12-03 – 2010-12-04 (×2): 1 mg via ORAL
  Filled 2010-12-02 (×3): qty 1

## 2010-12-02 MED ORDER — PANTOPRAZOLE SODIUM 40 MG IV SOLR
40.0000 mg | INTRAVENOUS | Status: DC
Start: 2010-12-02 — End: 2010-12-03
  Administered 2010-12-02 – 2010-12-03 (×2): 40 mg via INTRAVENOUS
  Filled 2010-12-02 (×2): qty 40

## 2010-12-02 MED ORDER — DEXTROSE-NACL 5-0.9 % IV SOLN
INTRAVENOUS | Status: DC
  Administered 2010-12-02 – 2010-12-03 (×5): via INTRAVENOUS

## 2010-12-02 MED ORDER — ALUM & MAG HYDROXIDE-SIMETH 200-200-20 MG/5ML PO SUSP: 30.0000 mL | Freq: Four times a day (QID) | ORAL | Status: DC | PRN

## 2010-12-02 MED ORDER — ALBUTEROL SULFATE (5 MG/ML) 0.5% IN NEBU
2.5000 mg | INHALATION_SOLUTION | Freq: Four times a day (QID) | RESPIRATORY_TRACT | Status: DC
  Administered 2010-12-02 – 2010-12-04 (×4): 2.5 mg via RESPIRATORY_TRACT
  Filled 2010-12-02 (×3): qty 0.5

## 2010-12-02 MED ORDER — IPRATROPIUM BROMIDE 0.02 % IN SOLN
0.5000 mg | Freq: Four times a day (QID) | RESPIRATORY_TRACT | Status: DC
  Administered 2010-12-02 – 2010-12-04 (×4): 0.5 mg via RESPIRATORY_TRACT
  Filled 2010-12-02 (×5): qty 2.5

## 2010-12-02 MED ORDER — ACETAMINOPHEN 650 MG RE SUPP: 650.0000 mg | Freq: Four times a day (QID) | RECTAL | Status: DC | PRN

## 2010-12-02 MED ORDER — ACETAMINOPHEN 325 MG PO TABS: 650.0000 mg | ORAL_TABLET | Freq: Four times a day (QID) | ORAL | Status: DC | PRN

## 2010-12-02 MED ORDER — GUAIFENESIN-DM 100-10 MG/5ML PO SYRP
5.0000 mL | ORAL_SOLUTION | ORAL | Status: DC | PRN
  Filled 2010-12-02: qty 5

## 2010-12-02 MED ORDER — VITAMIN B-1 100 MG PO TABS
100.0000 mg | ORAL_TABLET | Freq: Every day | ORAL | Status: DC
  Administered 2010-12-03 – 2010-12-04 (×2): 100 mg via ORAL
  Filled 2010-12-02 (×3): qty 1

## 2010-12-02 MED ORDER — ALBUTEROL SULFATE (5 MG/ML) 0.5% IN NEBU
INHALATION_SOLUTION | RESPIRATORY_TRACT | Status: AC
  Administered 2010-12-03: 2.5 mg via RESPIRATORY_TRACT
  Filled 2010-12-02: qty 0.5

## 2010-12-02 MED ORDER — ONDANSETRON HCL 4 MG/2ML IJ SOLN
4.0000 mg | INTRAMUSCULAR | Status: DC | PRN
  Administered 2010-12-02 – 2010-12-04 (×11): 4 mg via INTRAVENOUS
  Filled 2010-12-02 (×12): qty 2

## 2010-12-02 MED ORDER — ENOXAPARIN SODIUM 30 MG/0.3ML ~~LOC~~ SOLN
30.0000 mg | SUBCUTANEOUS | Status: DC
  Administered 2010-12-02: 30 mg via SUBCUTANEOUS
  Filled 2010-12-02 (×2): qty 0.3

## 2010-12-02 NOTE — ED Notes (Signed)
Called to patient  Room, patient stating that she has lost her teeth, no teeth were noted to be with patient since assuming patient's care. Room was searched and patients teeth were noted to be under patient on stretcher with patient

## 2010-12-02 NOTE — ED Notes (Signed)
Patient offered shower but declined at this time 

## 2010-12-02 NOTE — Progress Notes (Signed)
Patient ID: AERIANNA LOSEY, female   DOB: 1950/02/06, 60 y.o.   MRN: [id number] Subjective: Patient seen.Complain of generalised abdominal pain with nausea and vomiting-multiple times.  Objective: Weight change:  No intake or output data in the 24 hours ending 12/02/10 0847 BP 194/108  Pulse 98  Temp(Src) 98.2 F (36.8 C) (Oral)  Resp 19  SpO2 100% Physical Exam: General appearance: alert, cooperative and no distress Head: Normocephalic, without obvious abnormality, atraumatic Neck: no adenopathy, no carotid bruit, no JVD, supple, symmetrical, trachea midline and thyroid not enlarged, symmetric, no tenderness/mass/nodules Lungs: minimally scattered rhonchi Heart: regular rate and rhythm, S1, S2 normal, no murmur, click, rub or gallop Abdomen: soft, tenderness in the peri-umbilical and epigastric region; bowel sounds normal; no masses,  no organomegaly Extremities: extremities normal, atraumatic, no cyanosis or edema Skin: Skin color, texture, turgor normal. No rashes or lesions  Lab Results: Results for orders placed during the hospital encounter of 12/01/10 (from the past 48 hour(s))  CBC     Status: Normal   Collection Time   12/01/10  8:50 AM      Component Value Range Comment   WBC 9.5  4.0 - 10.5 (K/uL)    RBC 4.02  3.87 - 5.11 (MIL/uL)    Hemoglobin 13.1  12.0 - 15.0 (g/dL)    HCT 40.9  81.1 - 91.4 (%)    MCV 95.0  78.0 - 100.0 (fL)    MCH 32.6  26.0 - 34.0 (pg)    MCHC 34.3  30.0 - 36.0 (g/dL)    RDW 78.2  95.6 - 21.3 (%)    Platelets 223  150 - 400 (K/uL)   DIFFERENTIAL     Status: Normal   Collection Time   12/01/10  8:50 AM      Component Value Range Comment   Neutrophils Relative 75  43 - 77 (%)    Neutro Abs 7.1  1.7 - 7.7 (K/uL)    Lymphocytes Relative 14  12 - 46 (%)    Lymphs Abs 1.4  0.7 - 4.0 (K/uL)    Monocytes Relative 8  3 - 12 (%)    Monocytes Absolute 0.8  0.1 - 1.0 (K/uL)    Eosinophils Relative 2  0 - 5 (%)    Eosinophils Absolute 0.2  0.0 -  0.7 (K/uL)    Basophils Relative 0  0 - 1 (%)    Basophils Absolute 0.0  0.0 - 0.1 (K/uL)   COMPREHENSIVE METABOLIC PANEL     Status: Abnormal   Collection Time   12/01/10  8:50 AM      Component Value Range Comment   Sodium 137  135 - 145 (mEq/L)    Potassium 3.9  3.5 - 5.1 (mEq/L)    Chloride 101  96 - 112 (mEq/L)    CO2 27  19 - 32 (mEq/L)    Glucose, Bld 91  70 - 99 (mg/dL)    BUN 6  6 - 23 (mg/dL)    Creatinine, Ser 0.86  0.50 - 1.10 (mg/dL)    Calcium 9.6  8.4 - 10.5 (mg/dL)    Total Protein 7.2  6.0 - 8.3 (g/dL)    Albumin 3.0 (*) 3.5 - 5.2 (g/dL)    AST 28  0 - 37 (U/L)    ALT 15  0 - 35 (U/L)    Alkaline Phosphatase 106  39 - 117 (U/L)    Total Bilirubin 0.1 (*) 0.3 - 1.2 (mg/dL)    GFR  calc non Af Amer >90  >90 (mL/min)    GFR calc Af Amer >90  >90 (mL/min)   LIPASE, BLOOD     Status: Normal   Collection Time   12/01/10  8:50 AM      Component Value Range Comment   Lipase 44  11 - 59 (U/L)   BLOOD GAS, ARTERIAL     Status: Abnormal   Collection Time   12/01/10  2:10 PM      Component Value Range Comment   FIO2 .28      Delivery systems NASAL CANNULA      pH, Arterial 7.364  7.350 - 7.400     pCO2 arterial 33.9 (*) 35.0 - 45.0 (mmHg)    pO2, Arterial 112.0 (*) 80.0 - 100.0 (mmHg)    Bicarbonate 18.8 (*) 20.0 - 24.0 (mEq/L)    TCO2 17.4  0 - 100 (mmol/L)    Acid-base deficit 5.3 (*) 0.0 - 2.0 (mmol/L)    O2 Saturation 97.4      Patient temperature 98.6      Collection site LEFT RADIAL      Drawn by [id number]      Sample type ARTERIAL DRAW      Allens test (pass/fail) PASS  PASS    BASIC METABOLIC PANEL     Status: Normal   Collection Time   12/02/10  6:06 AM      Component Value Range Comment   Sodium 139  135 - 145 (mEq/L)    Potassium 3.7  3.5 - 5.1 (mEq/L)    Chloride 102  96 - 112 (mEq/L)    CO2 28  19 - 32 (mEq/L)    Glucose, Bld 92  70 - 99 (mg/dL)    BUN 8  6 - 23 (mg/dL)    Creatinine, Ser 4.69  0.50 - 1.10 (mg/dL)    Calcium 9.9  8.4 - 10.5  (mg/dL)    GFR calc non Af Amer >90  >90 (mL/min)    GFR calc Af Amer >90  >90 (mL/min)   CBC     Status: Abnormal   Collection Time   12/02/10  6:06 AM      Component Value Range Comment   WBC 15.5 (*) 4.0 - 10.5 (K/uL)    RBC 3.61 (*) 3.87 - 5.11 (MIL/uL)    Hemoglobin 11.4 (*) 12.0 - 15.0 (g/dL)    HCT 62.9 (*) 52.8 - 46.0 (%)    MCV 94.7  78.0 - 100.0 (fL)    MCH 31.6  26.0 - 34.0 (pg)    MCHC 33.3  30.0 - 36.0 (g/dL)    RDW 41.3  24.4 - 01.0 (%)    Platelets 201  150 - 400 (K/uL)     Micro Results: No results found for this or any previous visit (from the past 240 hour(s)).  Studies/Results: Dg Chest 2 View  12/01/2010  *RADIOLOGY REPORT*  Clinical Data: Shortness of breath and weakness.  CHEST - 2 VIEW  Comparison: 11/27/2010.  Findings: Tip of left PICC terminates in superior vena cava.  No pneumothorax is evident. The cardiac silhouette is normal size and shape.  Mediastinal and hilar contours appear stable.  There is slight chronic increase in the reticular interstitial markings and there is a mild hyperinflation configuration.  There is a new patchy infiltrate in the inferior lateral aspect of the right upper lobe.  No pleural effusion is evident.  There is slight tenting of the left hemidiaphragmatic pleura. Bones  appear average for age.  IMPRESSION: PICC in place.  No pneumothorax.  There is slight chronic increase in the reticular interstitial markings and there is a mild hyperinflation configuration.  There is a new patchy infiltrate in the inferior lateral aspect of the right upper lobe. This may reflect atelectasis or small area of pneumonia.  Original Report Authenticated By: Crawford Givens, M.D.   Dg Chest 2 View  11/27/2010  *RADIOLOGY REPORT*  Clinical Data: Chest pain.  Cough and fever  CHEST - 2 VIEW  Comparison: 12/21/2009  Findings: Heart size appears normal.  No pleural effusion or pulmonary edema.  Increased lung volumes and coarsened interstitial markings are noted  bilaterally suggestive of COPD.  Patchy bilateral airspace opacities are noted.  There is a left-sided PICC line with tip in the SVC.  IMPRESSION:  1.  Bilateral patchy airspace opacities are superimposed upon chronic lung disease.  Suspect multifocal infection.  Original Report Authenticated By: Rosealee Albee, M.D.   Ir Fluoro Guide Cv Line Left  11/27/2010  *RADIOLOGY REPORT*  Clinical Data: The patient requires PICC line for IV contrast administration for CT scan.  She has a history of poor venous access and IV infiltration during CT with peripheral IV access.  CT is for follow-up of prior percutaneous ablation of a left renal carcinoma.  PICC LINE PLACEMENT WITH ULTRASOUND AND FLUOROSCOPIC  GUIDANCE  Fluoroscopy Time: 0.9 minutes.  The left arm was prepped with chlorhexidine, draped in the usual sterile fashion using maximum barrier technique (cap and mask, sterile gown, sterile gloves, large sterile sheet, hand hygiene and cutaneous antisepsis) and infiltrated locally with 1% Lidocaine.  Ultrasound demonstrated patency of the left brachial vein, and this was documented with an image.  Under real-time ultrasound guidance, this vein was accessed with a 21 gauge micropuncture needle and image documentation was performed.  The needle was exchanged over a guidewire for a peel-away sheath through which a 5 [place] single lumen PICC trimmed to 40 cm was advanced, positioned with its tip at the lower SVC/right atrial junction.  Fluoroscopy during the procedure and fluoro spot radiograph confirms appropriate catheter position.  The catheter was flushed, secured to the skin with Prolene sutures, and covered with a sterile dressing.  Complications:  None  IMPRESSION: Successful left arm PICC line placement with ultrasound and fluoroscopic guidance.  The catheter is ready for use.  Original Report Authenticated By: Reola Calkins, M.D.   Ir US Guide Vasc Access Left  11/27/2010  *RADIOLOGY REPORT*  Clinical  Data: The patient requires PICC line for IV contrast administration for CT scan.  She has a history of poor venous access and IV infiltration during CT with peripheral IV access.  CT is for follow-up of prior percutaneous ablation of a left renal carcinoma.  PICC LINE PLACEMENT WITH ULTRASOUND AND FLUOROSCOPIC  GUIDANCE  Fluoroscopy Time: 0.9 minutes.  The left arm was prepped with chlorhexidine, draped in the usual sterile fashion using maximum barrier technique (cap and mask, sterile gown, sterile gloves, large sterile sheet, hand hygiene and cutaneous antisepsis) and infiltrated locally with 1% Lidocaine.  Ultrasound demonstrated patency of the left brachial vein, and this was documented with an image.  Under real-time ultrasound guidance, this vein was accessed with a 21 gauge micropuncture needle and image documentation was performed.  The needle was exchanged over a guidewire for a peel-away sheath through which a 5 [place] single lumen PICC trimmed to 40 cm was advanced, positioned with its tip at the  lower SVC/right atrial junction.  Fluoroscopy during the procedure and fluoro spot radiograph confirms appropriate catheter position.  The catheter was flushed, secured to the skin with Prolene sutures, and covered with a sterile dressing.  Complications:  None  IMPRESSION: Successful left arm PICC line placement with ultrasound and fluoroscopic guidance.  The catheter is ready for use.  Original Report Authenticated By: Reola Calkins, M.D.   Ct Abd Wo & W Cm  11/27/2010  *RADIOLOGY REPORT*  Clinical Data: Recurrent left renal cell carcinoma.  5 months status post percutaneous cryoablation.  CT ABDOMEN WITHOUT AND WITH CONTRAST  Technique:  Multidetector CT imaging of the abdomen was performed following the standard protocol before and during bolus administration of intravenous contrast.  Contrast: OMNIPAQUE IOHEXOL 300 MG/ML IV SOLN  Comparison: 03/10/2010  Findings: Expected appearance of low  attenuation cryoablation defect is seen in the medial lower pole of the left kidney.  There is no evidence of residual hypervascular neoplasm at the ablation site.  No other masses are seen involving the left kidney.  The right kidney remains normal in appearance.  There is no evidence of hydronephrosis.  Mild biliary ductal dilatation remains stable.  Gallbladder is unremarkable.  No evidence of pancreatic mass or pancreatic ductal dilatation.  No liver masses are identified.  The spleen and adrenal glands are also normal in appearance.  There is no evidence of retroperitoneal or other abdominal lymphadenopathy.  No evidence of inflammatory process or abnormal fluid collections within the abdomen.  Visualized portions of the lung bases are clear.  IMPRESSION:  1.  Expected appearance of cryoablation defect in the medial lower pole of the left kidney.  No evidence of residual or recurrent neoplasm. 2.  No evidence of metastatic disease or other acute findings.  Original Report Authenticated By: Danae Orleans, M.D.   Medications: Scheduled Meds:   . albuterol  2.5 mg Nebulization Once  . albuterol  2.5 mg Nebulization Once  . albuterol  2.5 mg Nebulization Q6H  . albuterol  5 mg Nebulization Once  . azithromycin  500 mg Intravenous Once  . azithromycin  500 mg Intravenous Q24H  . cefTRIAXone (ROCEPHIN)  IV  1 g Intravenous Once  . cefTRIAXone (ROCEPHIN) IVPB 1 gram/50 mL D5W  1 g Intravenous Q24H  . enoxaparin  30 mg Subcutaneous Q24H  . folic acid  1 mg Oral Daily  . HYDROmorphone      . HYDROmorphone      . HYDROmorphone      . HYDROmorphone      .  HYDROmorphone (DILAUDID) injection  1 mg Intravenous Once  .  HYDROmorphone (DILAUDID) injection  1 mg Intravenous Once  . ipratropium  0.5 mg Nebulization Q6H  . LORazepam  1 mg Intravenous Once  . LORazepam  1 mg Oral TID  . methylPREDNISolone (SOLU-MEDROL) injection  125 mg Intravenous Once  . multivitamins ther. w/minerals  1 tablet Oral  Daily  . nicotine  21 mg Transdermal Daily  . ondansetron  4 mg Intravenous Once  . ondansetron  4 mg Intravenous Once  . sodium chloride  1,000 mL Intravenous Once  . thiamine  100 mg Oral Daily  . DISCONTD: azithromycin  500 mg Intravenous Q24H  . DISCONTD: cefTRIAXone (ROCEPHIN)  IV  1 g Intravenous Q24H  . DISCONTD:  HYDROmorphone (DILAUDID) injection  1 mg Intravenous Once  . DISCONTD:  HYDROmorphone (DILAUDID) injection  1 mg Intravenous Once   Continuous Infusions:   . dextrose  5 % and 0.9% NaCl    . DISCONTD: 0.9 % NaCl with KCl 20 mEq / L     PRN Meds:.acetaminophen, acetaminophen, albuterol, alum & mag hydroxide-simeth, guaiFENesin-dextromethorphan, HYDROcodone-acetaminophen, HYDROmorphone, ondansetron, promethazine, senna, DISCONTD: ondansetron  Assessment/Plan: #1 Copd Exacerbation-will continue nebs treatment. #2 ?Pneumonia-will continue iv rocephin and zithromax #3  Hx of Pancreatitis-npo for now.Will order lipase and amylase and pain control #4  Hx of Anxiety disorder-will add ativan prn to regimen #5 Hx of Gastric ulcer/Gerd-will add protonix to regimen #6 Chronic pain syndrome #7 Hx of chronic tobacco use  LOS: 1 day   Dayshawn Irizarry 12/02/2010, 8:47 AM

## 2010-12-02 NOTE — ED Notes (Signed)
Pt complaining of nausea, nausea medication not due until 0730. On call md paged to request phenergran medication per pt request

## 2010-12-03 LAB — COMPREHENSIVE METABOLIC PANEL
ALT: 12 U/L (ref 0–35)
Albumin: 3 g/dL — ABNORMAL LOW (ref 3.5–5.2)
Alkaline Phosphatase: 91 U/L (ref 39–117)
GFR calc Af Amer: >90 mL/min (ref >90)
Glucose, Bld: 98 mg/dL (ref 70–99)
Potassium: 2.8 mEq/L — ABNORMAL LOW (ref 3.5–5.1)
Sodium: 136 mEq/L (ref 135–145)
Total Protein: 7 g/dL (ref 6.0–8.3)

## 2010-12-03 LAB — CBC
MCH: 31.7 pg (ref 26.0–34.0)
Platelets: 198 10*3/uL (ref 150–400)
RBC: 3.79 MIL/uL — ABNORMAL LOW (ref 3.87–5.11)
WBC: 10 10*3/uL (ref 4.0–10.5)

## 2010-12-03 LAB — DIFFERENTIAL
Eosinophils Absolute: 0 10*3/uL (ref 0.0–0.7)
Lymphs Abs: 1.3 10*3/uL (ref 0.7–4.0)
Neutrophils Relative %: 79 % — ABNORMAL HIGH (ref 43–77)

## 2010-12-03 MED ORDER — POTASSIUM CHLORIDE 10 MEQ/100ML IV SOLN
10.0000 meq | INTRAVENOUS | Status: AC
  Administered 2010-12-03 (×2): 10 meq via INTRAVENOUS
  Filled 2010-12-03 (×2): qty 100

## 2010-12-03 MED ORDER — ENOXAPARIN SODIUM 40 MG/0.4ML ~~LOC~~ SOLN
40.0000 mg | SUBCUTANEOUS | Status: DC
  Administered 2010-12-03: 40 mg via SUBCUTANEOUS
  Filled 2010-12-03 (×2): qty 0.4

## 2010-12-03 MED ORDER — PANTOPRAZOLE SODIUM 40 MG PO TBEC
40.0000 mg | DELAYED_RELEASE_TABLET | Freq: Every day | ORAL | Status: DC
  Administered 2010-12-04: 40 mg via ORAL
  Filled 2010-12-03: qty 1

## 2010-12-03 MED ORDER — AZITHROMYCIN 500 MG PO TABS
500.0000 mg | ORAL_TABLET | Freq: Every day | ORAL | Status: DC
  Administered 2010-12-04: 500 mg via ORAL
  Filled 2010-12-03: qty 1

## 2010-12-03 MED ORDER — POTASSIUM CHLORIDE CRYS ER 10 MEQ PO TBCR
10.0000 meq | EXTENDED_RELEASE_TABLET | Freq: Two times a day (BID) | ORAL | Status: DC
  Administered 2010-12-03 – 2010-12-04 (×3): 10 meq via ORAL
  Filled 2010-12-03 (×4): qty 1

## 2010-12-03 NOTE — Plan of Care (Signed)
Problem: Phase II Progression Outcomes Goal: Wean O2 if indicated Outcome: Not Applicable Date Met:  12/03/10 Has not been on O2 since admission to [facility]

## 2010-12-03 NOTE — Progress Notes (Signed)
Patient ID: [name], female   DOB: 07-06-1950, 60 y.o.   MRN: [id number] Patient ID: [name], female   DOB: 09-23-1950, 60 y.o.   MRN: [id number] Subjective: Patient seen.Feels better and demanding for food.Serum amylase and lipase are normal  Objective: Weight change:   Intake/Output Summary (Last 24 hours) at 12/03/10 0858 Last data filed at 12/03/10 0400  Gross per 24 hour  Intake   1332 ml  Output   2301 ml  Net   -969 ml   BP 125/72  Pulse 76  Temp(Src) 97.8 F (36.6 C) (Oral)  Resp 16  Ht 5\' 4"  (1.626 m)  Wt 65.857 kg (145 lb 3 oz)  BMI 24.92 kg/m2  SpO2 97% Physical Exam: General appearance: alert, cooperative and no distress Head: Normocephalic, without obvious abnormality, atraumatic Neck: no adenopathy, no carotid bruit, no JVD, supple, symmetrical, trachea midline and thyroid not enlarged, symmetric, no tenderness/mass/nodules Lungs: minimally scattered rhonchi Heart: regular rate and rhythm, S1, S2 normal, no murmur, click, rub or gallop Abdomen: soft, mild epigastric region tenderness; bowel sounds normal; no masses,  no organomegaly Extremities: extremities normal, atraumatic, no cyanosis or edema Skin: Skin color, texture, turgor normal. No rashes or lesions  Lab Results: Results for orders placed during the hospital encounter of 12/01/10 (from the past 48 hour(s))  CBC     Status: Normal   Collection Time   12/01/10  8:50 AM      Component Value Range Comment   WBC 9.5  4.0 - 10.5 (K/uL)    RBC 4.02  3.87 - 5.11 (MIL/uL)    Hemoglobin 13.1  12.0 - 15.0 (g/dL)    HCT 78.2  95.6 - 21.3 (%)    MCV 95.0  78.0 - 100.0 (fL)    MCH 32.6  26.0 - 34.0 (pg)    MCHC 34.3  30.0 - 36.0 (g/dL)    RDW 08.6  57.8 - 46.9 (%)    Platelets 223  150 - 400 (K/uL)   DIFFERENTIAL     Status: Normal   Collection Time   12/01/10  8:50 AM      Component Value Range Comment   Neutrophils Relative 75  43 - 77 (%)    Neutro Abs 7.1  1.7 - 7.7 (K/uL)    Lymphocytes Relative 14  12 - 46 (%)    Lymphs Abs 1.4  0.7 - 4.0 (K/uL)    Monocytes Relative 8  3 - 12 (%)    Monocytes Absolute 0.8  0.1 - 1.0 (K/uL)    Eosinophils Relative 2  0 - 5 (%)    Eosinophils Absolute 0.2  0.0 - 0.7 (K/uL)    Basophils Relative 0  0 - 1 (%)    Basophils Absolute 0.0  0.0 - 0.1 (K/uL)   COMPREHENSIVE METABOLIC PANEL     Status: Abnormal   Collection Time   12/01/10  8:50 AM      Component Value Range Comment   Sodium 137  135 - 145 (mEq/L)    Potassium 3.9  3.5 - 5.1 (mEq/L)    Chloride 101  96 - 112 (mEq/L)    CO2 27  19 - 32 (mEq/L)    Glucose, Bld 91  70 - 99 (mg/dL)    BUN 6  6 - 23 (mg/dL)    Creatinine, Ser 6.29  0.50 - 1.10 (mg/dL)    Calcium 9.6  8.4 - 10.5 (mg/dL)    Total Protein 7.2  6.0 -  8.3 (g/dL)    Albumin 3.0 (*) 3.5 - 5.2 (g/dL)    AST 28  0 - 37 (U/L)    ALT 15  0 - 35 (U/L)    Alkaline Phosphatase 106  39 - 117 (U/L)    Total Bilirubin 0.1 (*) 0.3 - 1.2 (mg/dL)    GFR calc non Af Amer >90  >90 (mL/min)    GFR calc Af Amer >90  >90 (mL/min)   LIPASE, BLOOD     Status: Normal   Collection Time   12/01/10  8:50 AM      Component Value Range Comment   Lipase 44  11 - 59 (U/L)   BLOOD GAS, ARTERIAL     Status: Abnormal   Collection Time   12/01/10  2:10 PM      Component Value Range Comment   FIO2 .28      Delivery systems NASAL CANNULA      pH, Arterial 7.364  7.350 - 7.400     pCO2 arterial 33.9 (*) 35.0 - 45.0 (mmHg)    pO2, Arterial 112.0 (*) 80.0 - 100.0 (mmHg)    Bicarbonate 18.8 (*) 20.0 - 24.0 (mEq/L)    TCO2 17.4  0 - 100 (mmol/L)    Acid-base deficit 5.3 (*) 0.0 - 2.0 (mmol/L)    O2 Saturation 97.4      Patient temperature 98.6      Collection site LEFT RADIAL      Drawn by [id number]      Sample type ARTERIAL DRAW      Allens test (pass/fail) PASS  PASS    BASIC METABOLIC PANEL     Status: Normal   Collection Time   12/02/10  6:06 AM      Component Value Range Comment   Sodium 139  135 - 145 (mEq/L)     Potassium 3.7  3.5 - 5.1 (mEq/L)    Chloride 102  96 - 112 (mEq/L)    CO2 28  19 - 32 (mEq/L)    Glucose, Bld 92  70 - 99 (mg/dL)    BUN 8  6 - 23 (mg/dL)    Creatinine, Ser 8.11  0.50 - 1.10 (mg/dL)    Calcium 9.9  8.4 - 10.5 (mg/dL)    GFR calc non Af Amer >90  >90 (mL/min)    GFR calc Af Amer >90  >90 (mL/min)   CBC     Status: Abnormal   Collection Time   12/02/10  6:06 AM      Component Value Range Comment   WBC 15.5 (*) 4.0 - 10.5 (K/uL)    RBC 3.61 (*) 3.87 - 5.11 (MIL/uL)    Hemoglobin 11.4 (*) 12.0 - 15.0 (g/dL)    HCT 91.4 (*) 78.2 - 46.0 (%)    MCV 94.7  78.0 - 100.0 (fL)    MCH 31.6  26.0 - 34.0 (pg)    MCHC 33.3  30.0 - 36.0 (g/dL)    RDW 95.6  21.3 - 08.6 (%)    Platelets 201  150 - 400 (K/uL)     Micro Results: No results found for this or any previous visit (from the past 240 hour(s)).  Studies/Results: Dg Chest 2 View  12/01/2010  *RADIOLOGY REPORT*  Clinical Data: Shortness of breath and weakness.  CHEST - 2 VIEW  Comparison: 11/27/2010.  Findings: Tip of left PICC terminates in superior vena cava.  No pneumothorax is evident. The cardiac silhouette is normal size and shape.  Mediastinal and hilar contours appear stable.  There is slight chronic increase in the reticular interstitial markings and there is a mild hyperinflation configuration.  There is a new patchy infiltrate in the inferior lateral aspect of the right upper lobe.  No pleural effusion is evident.  There is slight tenting of the left hemidiaphragmatic pleura. Bones appear average for age.  IMPRESSION: PICC in place.  No pneumothorax.  There is slight chronic increase in the reticular interstitial markings and there is a mild hyperinflation configuration.  There is a new patchy infiltrate in the inferior lateral aspect of the right upper lobe. This may reflect atelectasis or small area of pneumonia.  Original Report Authenticated By: Crawford Givens, M.D.   Dg Chest 2 View  11/27/2010  *RADIOLOGY REPORT*   Clinical Data: Chest pain.  Cough and fever  CHEST - 2 VIEW  Comparison: 12/21/2009  Findings: Heart size appears normal.  No pleural effusion or pulmonary edema.  Increased lung volumes and coarsened interstitial markings are noted bilaterally suggestive of COPD.  Patchy bilateral airspace opacities are noted.  There is a left-sided PICC line with tip in the SVC.  IMPRESSION:  1.  Bilateral patchy airspace opacities are superimposed upon chronic lung disease.  Suspect multifocal infection.  Original Report Authenticated By: Rosealee Albee, M.D.   Ir Fluoro Guide Cv Line Left  11/27/2010  *RADIOLOGY REPORT*  Clinical Data: The patient requires PICC line for IV contrast administration for CT scan.  She has a history of poor venous access and IV infiltration during CT with peripheral IV access.  CT is for follow-up of prior percutaneous ablation of a left renal carcinoma.  PICC LINE PLACEMENT WITH ULTRASOUND AND FLUOROSCOPIC  GUIDANCE  Fluoroscopy Time: 0.9 minutes.  The left arm was prepped with chlorhexidine, draped in the usual sterile fashion using maximum barrier technique (cap and mask, sterile gown, sterile gloves, large sterile sheet, hand hygiene and cutaneous antisepsis) and infiltrated locally with 1% Lidocaine.  Ultrasound demonstrated patency of the left brachial vein, and this was documented with an image.  Under real-time ultrasound guidance, this vein was accessed with a 21 gauge micropuncture needle and image documentation was performed.  The needle was exchanged over a guidewire for a peel-away sheath through which a 5 [place] single lumen PICC trimmed to 40 cm was advanced, positioned with its tip at the lower SVC/right atrial junction.  Fluoroscopy during the procedure and fluoro spot radiograph confirms appropriate catheter position.  The catheter was flushed, secured to the skin with Prolene sutures, and covered with a sterile dressing.  Complications:  None  IMPRESSION: Successful left arm  PICC line placement with ultrasound and fluoroscopic guidance.  The catheter is ready for use.  Original Report Authenticated By: Reola Calkins, M.D.   Ir US Guide Vasc Access Left  11/27/2010  *RADIOLOGY REPORT*  Clinical Data: The patient requires PICC line for IV contrast administration for CT scan.  She has a history of poor venous access and IV infiltration during CT with peripheral IV access.  CT is for follow-up of prior percutaneous ablation of a left renal carcinoma.  PICC LINE PLACEMENT WITH ULTRASOUND AND FLUOROSCOPIC  GUIDANCE  Fluoroscopy Time: 0.9 minutes.  The left arm was prepped with chlorhexidine, draped in the usual sterile fashion using maximum barrier technique (cap and mask, sterile gown, sterile gloves, large sterile sheet, hand hygiene and cutaneous antisepsis) and infiltrated locally with 1% Lidocaine.  Ultrasound demonstrated patency of the left brachial vein, and  this was documented with an image.  Under real-time ultrasound guidance, this vein was accessed with a 21 gauge micropuncture needle and image documentation was performed.  The needle was exchanged over a guidewire for a peel-away sheath through which a 5 [place] single lumen PICC trimmed to 40 cm was advanced, positioned with its tip at the lower SVC/right atrial junction.  Fluoroscopy during the procedure and fluoro spot radiograph confirms appropriate catheter position.  The catheter was flushed, secured to the skin with Prolene sutures, and covered with a sterile dressing.  Complications:  None  IMPRESSION: Successful left arm PICC line placement with ultrasound and fluoroscopic guidance.  The catheter is ready for use.  Original Report Authenticated By: Reola Calkins, M.D.   Ct Abd Wo & W Cm  11/27/2010  *RADIOLOGY REPORT*  Clinical Data: Recurrent left renal cell carcinoma.  5 months status post percutaneous cryoablation.  CT ABDOMEN WITHOUT AND WITH CONTRAST  Technique:  Multidetector CT imaging of the  abdomen was performed following the standard protocol before and during bolus administration of intravenous contrast.  Contrast: OMNIPAQUE IOHEXOL 300 MG/ML IV SOLN  Comparison: 03/10/2010  Findings: Expected appearance of low attenuation cryoablation defect is seen in the medial lower pole of the left kidney.  There is no evidence of residual hypervascular neoplasm at the ablation site.  No other masses are seen involving the left kidney.  The right kidney remains normal in appearance.  There is no evidence of hydronephrosis.  Mild biliary ductal dilatation remains stable.  Gallbladder is unremarkable.  No evidence of pancreatic mass or pancreatic ductal dilatation.  No liver masses are identified.  The spleen and adrenal glands are also normal in appearance.  There is no evidence of retroperitoneal or other abdominal lymphadenopathy.  No evidence of inflammatory process or abnormal fluid collections within the abdomen.  Visualized portions of the lung bases are clear.  IMPRESSION:  1.  Expected appearance of cryoablation defect in the medial lower pole of the left kidney.  No evidence of residual or recurrent neoplasm. 2.  No evidence of metastatic disease or other acute findings.  Original Report Authenticated By: Danae Orleans, M.D.   Medications: Scheduled Meds:   . albuterol  2.5 mg Nebulization Once  . albuterol  2.5 mg Nebulization Once  . albuterol  2.5 mg Nebulization Q6H  . albuterol  5 mg Nebulization Once  . azithromycin  500 mg Intravenous Once  . azithromycin  500 mg Intravenous Q24H  . cefTRIAXone (ROCEPHIN)  IV  1 g Intravenous Once  . cefTRIAXone (ROCEPHIN) IVPB 1 gram/50 mL D5W  1 g Intravenous Q24H  . enoxaparin  30 mg Subcutaneous Q24H  . folic acid  1 mg Oral Daily  . HYDROmorphone      . HYDROmorphone      . HYDROmorphone      . HYDROmorphone      .  HYDROmorphone (DILAUDID) injection  1 mg Intravenous Once  .  HYDROmorphone (DILAUDID) injection  1 mg Intravenous Once   . ipratropium  0.5 mg Nebulization Q6H  . LORazepam  1 mg Intravenous Once  . LORazepam  1 mg Oral TID  . methylPREDNISolone (SOLU-MEDROL) injection  125 mg Intravenous Once  . multivitamins ther. w/minerals  1 tablet Oral Daily  . nicotine  21 mg Transdermal Daily  . ondansetron  4 mg Intravenous Once  . ondansetron  4 mg Intravenous Once  . sodium chloride  1,000 mL Intravenous Once  . thiamine  100 mg Oral Daily  . DISCONTD: azithromycin  500 mg Intravenous Q24H  . DISCONTD: cefTRIAXone (ROCEPHIN)  IV  1 g Intravenous Q24H  . DISCONTD:  HYDROmorphone (DILAUDID) injection  1 mg Intravenous Once  . DISCONTD:  HYDROmorphone (DILAUDID) injection  1 mg Intravenous Once   Continuous Infusions:   . dextrose 5 % and 0.9% NaCl    . DISCONTD: 0.9 % NaCl with KCl 20 mEq / L     PRN Meds:.acetaminophen, acetaminophen, albuterol, alum & mag hydroxide-simeth, guaiFENesin-dextromethorphan, HYDROcodone-acetaminophen, HYDROmorphone, ondansetron, promethazine, senna, DISCONTD: ondansetron  Assessment/Plan: #1 Copd Exacerbation-will continue nebs treatment. #2 ?Pneumonia-will continue iv rocephin and zithromax #3  Hx of Pancreatitis- lipase and amylase normal.Will stat po feeds and advance as tolerated #4  Hx of Anxiety disorder-continue ativan #5 Hx of Gastric ulcer/Gerd-continue protonix #6 Chronic pain syndrome #7 Hx of chronic tobacco use #8 Hypokalemia-K repletion For d/c home on 12/04/10  LOS: 2 days   [name] 12/03/2010, 8:58 AM

## 2010-12-03 NOTE — Progress Notes (Signed)
The patient is receiving Protonix by the intravenous route.  Based on criteria approved by the Pharmacy and Therapeutics Committee and the Medical Executive Committee, the medication is being converted to the equivalent oral dose form.  These criteria include: -No Active GI bleeding -Able to tolerate diet of full liquids (or better) or tube feeding OR able to tolerate other medications by the oral or enteral route  If you have any questions about this conversion, please contact the Pharmacy Department (ext [phone]).  Thank you.   PHARMACIST - PHYSICIAN COMMUNICATION  CONCERNING: Antibiotic IV to Oral Route Change Policy  RECOMMENDATION: This patient is receiving Zithromax by the intravenous route.  Based on criteria approved by the Pharmacy and Therapeutics Committee, the antibiotic(s) is/are being converted to the equivalent oral dose form(s).   DESCRIPTION: These criteria include:  Patient being treated for a respiratory tract infection, urinary tract infection, or cellulitis  The patient is not neutropenic and does not exhibit a GI malabsorption state  The patient is eating (either orally or via tube) and/or has been taking other orally administered medications for a least 24 hours  The patient is improving clinically and has a Tmax < 100.5  If you have questions about this conversion, please contact the Pharmacy Department  []   [phone] )  [facility] []   [phone] )  [facility]  []   [phone] )  [facility] [x]   [phone] )  [facility]

## 2010-12-04 LAB — CBC
HCT: 32.6 % — ABNORMAL LOW (ref 36.0–46.0)
MCH: 32.2 pg (ref 26.0–34.0)
MCHC: 33.4 g/dL (ref 30.0–36.0)
MCV: 96.4 fL (ref 78.0–100.0)
RDW: 13.5 % (ref 11.5–15.5)

## 2010-12-04 LAB — COMPREHENSIVE METABOLIC PANEL
Albumin: 2.3 g/dL — ABNORMAL LOW (ref 3.5–5.2)
BUN: 7 mg/dL (ref 6–23)
Calcium: 8.6 mg/dL (ref 8.4–10.5)
Creatinine, Ser: 0.74 mg/dL (ref 0.50–1.10)
Total Protein: 5.3 g/dL — ABNORMAL LOW (ref 6.0–8.3)

## 2010-12-04 MED ORDER — NICOTINE 21 MG/24HR TD PT24: 1.0000 | MEDICATED_PATCH | Freq: Every day | TRANSDERMAL | Status: AC

## 2010-12-04 MED ORDER — ACETAMINOPHEN 325 MG PO TABS: 650.0000 mg | ORAL_TABLET | Freq: Four times a day (QID) | ORAL | Status: AC | PRN

## 2010-12-04 MED ORDER — LEVOFLOXACIN 500 MG PO TABS: 500.0000 mg | ORAL_TABLET | Freq: Every day | ORAL | Status: AC

## 2010-12-04 MED ORDER — POTASSIUM CHLORIDE CRYS ER 10 MEQ PO TBCR: 10.0000 meq | EXTENDED_RELEASE_TABLET | Freq: Two times a day (BID) | ORAL | Status: DC

## 2010-12-04 MED ORDER — HYDROCODONE-ACETAMINOPHEN 10-325 MG PO TABS: 1.0000 | ORAL_TABLET | Freq: Four times a day (QID) | ORAL | Status: AC | PRN

## 2010-12-04 NOTE — Discharge Summary (Signed)
DISCHARGE SUMMARY  [name]  MR#: [id number]  DOB:March 24, 1950  Date of Admission: 12/01/2010 Date of Discharge: 12/04/2010  Attending Physician:Makalya Nave  Patient's WGN:[id number], Adela Lank, MD  Consults:Treatment Team:  Buena Irish, MD  Discharge Diagnoses:  #1 pneumonia #2 COPD exacerbation #3 acute on chronic pancreatitis #4 chronic pain syndrome #5 history of liver cirrhosis neck #6 anxiety disorder #7 history of kidney cancer #8 GERD #9 chronic tobacco use Present on Admission:  pneumonia    Current Discharge Medication List    START taking these medications   Details  acetaminophen (TYLENOL) 325 MG tablet Take 2 tablets (650 mg total) by mouth every 6 (six) hours as needed (or Fever >/= 101). Qty: 30 tablet, Refills: 1    HYDROcodone-acetaminophen (NORCO) 10-325 MG per tablet Take 1 tablet by mouth every 6 (six) hours as needed. Qty: 40 tablet, Refills: 0    levofloxacin (LEVAQUIN) 500 MG tablet Take 1 tablet (500 mg total) by mouth daily. Qty: 7 tablet, Refills: 0    nicotine (NICODERM CQ - DOSED IN MG/24 HOURS) 21 mg/24hr patch Place 1 patch (21 patches total) onto the skin daily. Qty: 1 patch, Refills: 2    potassium chloride (K-DUR,KLOR-CON) 10 MEQ tablet Take 1 tablet (10 mEq total) by mouth 2 (two) times daily. Qty: 30 tablet, Refills: 0      CONTINUE these medications which have NOT CHANGED   Details  albuterol (PROVENTIL HFA;VENTOLIN HFA) 108 (90 BASE) MCG/ACT inhaler Inhale 2 puffs into the lungs every 4 (four) hours as needed. For shortness of breath.     HYDROmorphone (DILAUDID) 4 MG tablet Take 4 mg by mouth every 6 (six) hours as needed. For pain.     LORazepam (ATIVAN) 1 MG tablet Take 1 mg by mouth every 6 (six) hours as needed. For anxiety.    ondansetron (ZOFRAN) 4 MG tablet Take 4 mg by mouth every 8 (eight) hours as needed. For nausea.      STOP taking these medications     HYDROcodone-acetaminophen (LORTAB)  10-500 MG per tablet           Hospital Course: Patient is a 60 year old Caucasian female with history of kidney cancer, COPD, cirrhosis, and tobacco use was admitted to the hospital on 12/01/2010 by Dr. Gasper Sells with complains of cough and shortness of breath. Examination and imaging studies done on patient confirmed pneumonia. She was subsequently admitted for stabilization. Patient was admitted to general medical floor she was treated for pneumonia with IV Zithromax and Rocephin. Patient was also treated for COPD with albuterol and Atrovent nebs. Patient however continued to complain of abdominal pain. Amylase and lipase done was essentially unremarkable. Patient chronic pain was managed with IV dilaudid. Patient was evaluated on daily basis. Chronic abdominal pain resolved resolved appear pneumonia showed gradual resolution. Patient expressed willingness to quit smoking and subsequently was given nicotine patch. So patient had been clinically stable. Vital signs are stable.Plan is for patient to be discharge home today. Present on Admission:  pneumonia   Day of Discharge BP 119/81  Pulse 74  Temp(Src) 98.1 F (36.7 C) (Oral)  Resp 17  Ht 5\' 4"  (1.626 m)  Wt 65.857 kg (145 lb 3 oz)  BMI 24.92 kg/m2  SpO2 98%  Physical Exam: heent-mild pallor Neck-no jvd Chest-clear cvs-s1 and s2 abd-soft,non tender,organd not palpable and bowel sounds present. Ext-no pedal edema Neuro-non focal Skin-normal turgor   Results for orders placed during the hospital encounter of 12/01/10 (from the past 24 hour(s))  CBC     Status: Abnormal   Collection Time   12/04/10  5:00 AM      Component Value Range   WBC 7.0  4.0 - 10.5 (K/uL)   RBC 3.38 (*) 3.87 - 5.11 (MIL/uL)   Hemoglobin 10.9 (*) 12.0 - 15.0 (g/dL)   HCT 40.9 (*) 81.1 - 46.0 (%)   MCV 96.4  78.0 - 100.0 (fL)   MCH 32.2  26.0 - 34.0 (pg)   MCHC 33.4  30.0 - 36.0 (g/dL)   RDW 91.4  78.2 - 95.6 (%)   Platelets 181  150 - 400 (K/uL)   COMPREHENSIVE METABOLIC PANEL     Status: Abnormal   Collection Time   12/04/10  5:00 AM      Component Value Range   Sodium 138  135 - 145 (mEq/L)   Potassium 3.5  3.5 - 5.1 (mEq/L)   Chloride 106  96 - 112 (mEq/L)   CO2 27  19 - 32 (mEq/L)   Glucose, Bld 72  70 - 99 (mg/dL)   BUN 7  6 - 23 (mg/dL)   Creatinine, Ser 2.13  0.50 - 1.10 (mg/dL)   Calcium 8.6  8.4 - 08.6 (mg/dL)   Total Protein 5.3 (*) 6.0 - 8.3 (g/dL)   Albumin 2.3 (*) 3.5 - 5.2 (g/dL)   AST 13  0 - 37 (U/L)   ALT 10  0 - 35 (U/L)   Alkaline Phosphatase 67  39 - 117 (U/L)   Total Bilirubin 0.1 (*) 0.3 - 1.2 (mg/dL)   GFR calc non Af Amer >90  >90 (mL/min)   GFR calc Af Amer >90  >90 (mL/min)    Disposition:stable   Follow-up Appts: Discharge Orders    Future Appointments: Provider: Department: Dept Phone: Center:   12/08/2010 4:30 PM [facility] [phone] [facility]     Future Orders Please Complete By Expires   Diet - low sodium heart healthy      Diet - low sodium heart healthy      Increase activity slowly      Discharge instructions      Comments:   Follow up 1-2weekks   Increase activity slowly         Signed: Ginamarie Banfield 12/04/2010, 10:41 AM

## 2010-12-08 ENCOUNTER — Inpatient Hospital Stay: Admission: RE | Admit: 2010-12-08 | Payer: Medicaid Other | Source: Ambulatory Visit

## 2010-12-16 ENCOUNTER — Telehealth: Payer: Self-pay | Admitting: Emergency Medicine

## 2010-12-16 NOTE — Telephone Encounter (Signed)
S/W MIKE Dzik AND TOLD HIM PER DR YAMAGATA THAT THE ABLATION SITE LOOKS GOOD FROM THE LAST PROCEDURE.  THEY DO NOT WANT TO COME IN THE OFFICE , BUT MIKE WANTS DR Fredia Sorrow TO CALL HIM VIA PHONE.\ WILL HAVE DR GY TO CALL BY 12-23-10.

## 2010-12-17 IMAGING — CR DG ABDOMEN ACUTE W/ 1V CHEST
3 series · 3 of 3 positions shown · non-contrast
Comparison: 04/15/2008

CLINICAL DATA: Chest pain

ACUTE ABDOMEN SERIES (ABDOMEN 2 VIEW & CHEST 1 VIEW)

[w chest pa]
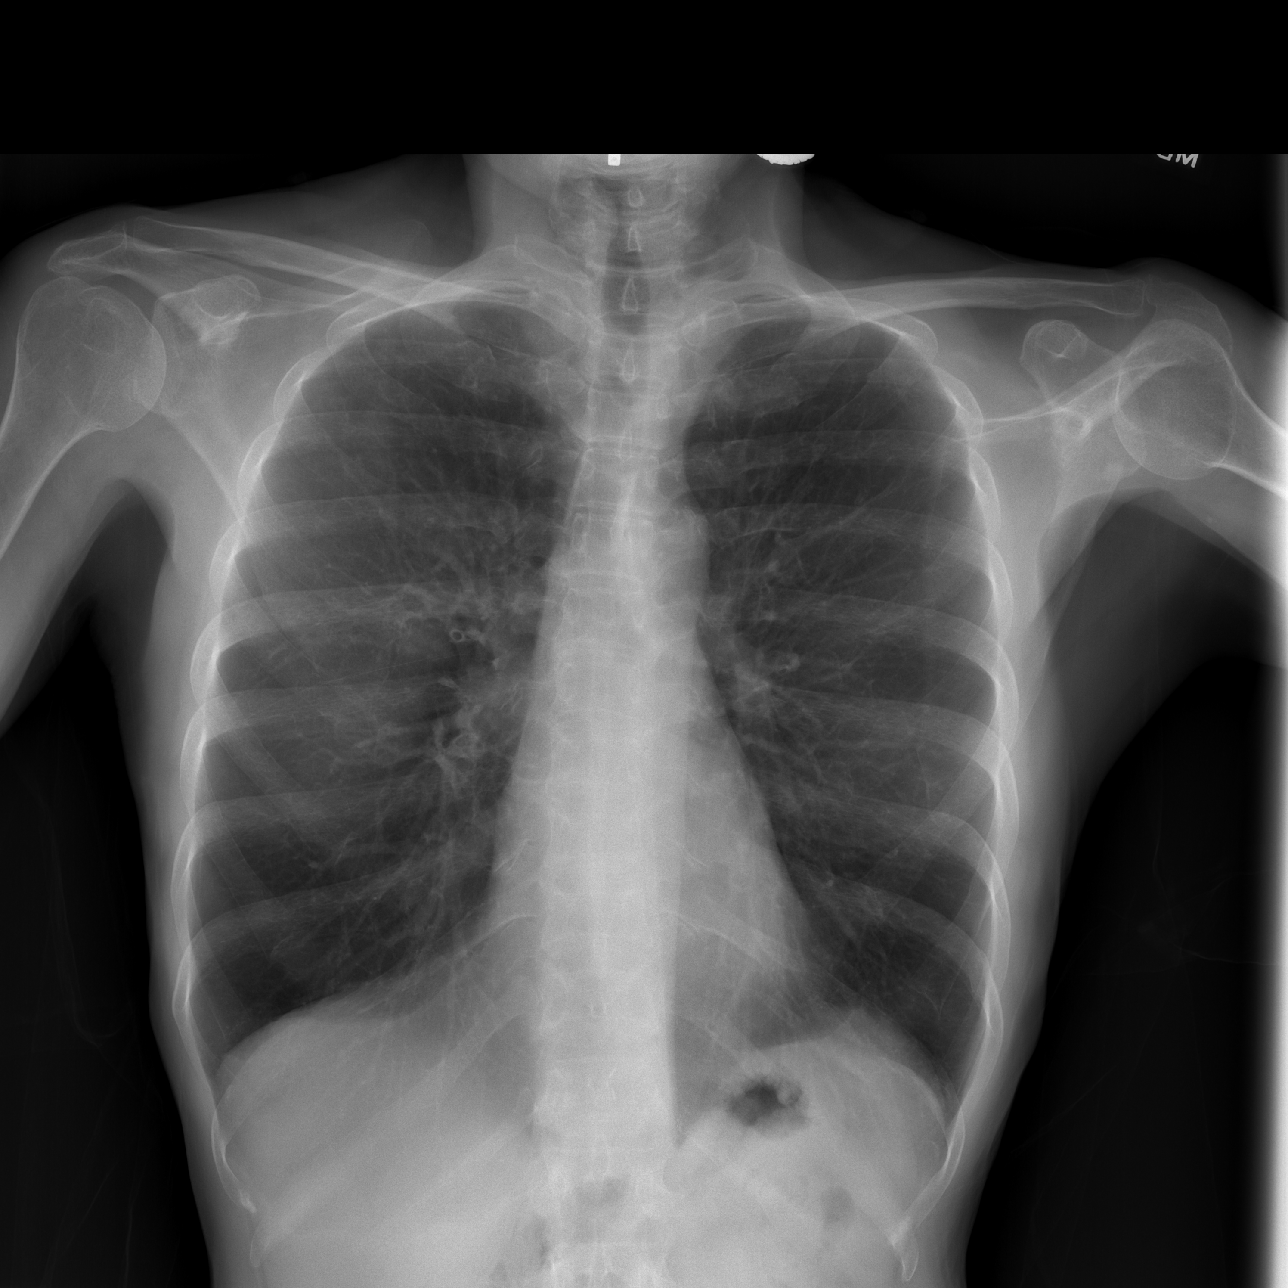

[w abdomen upright *]
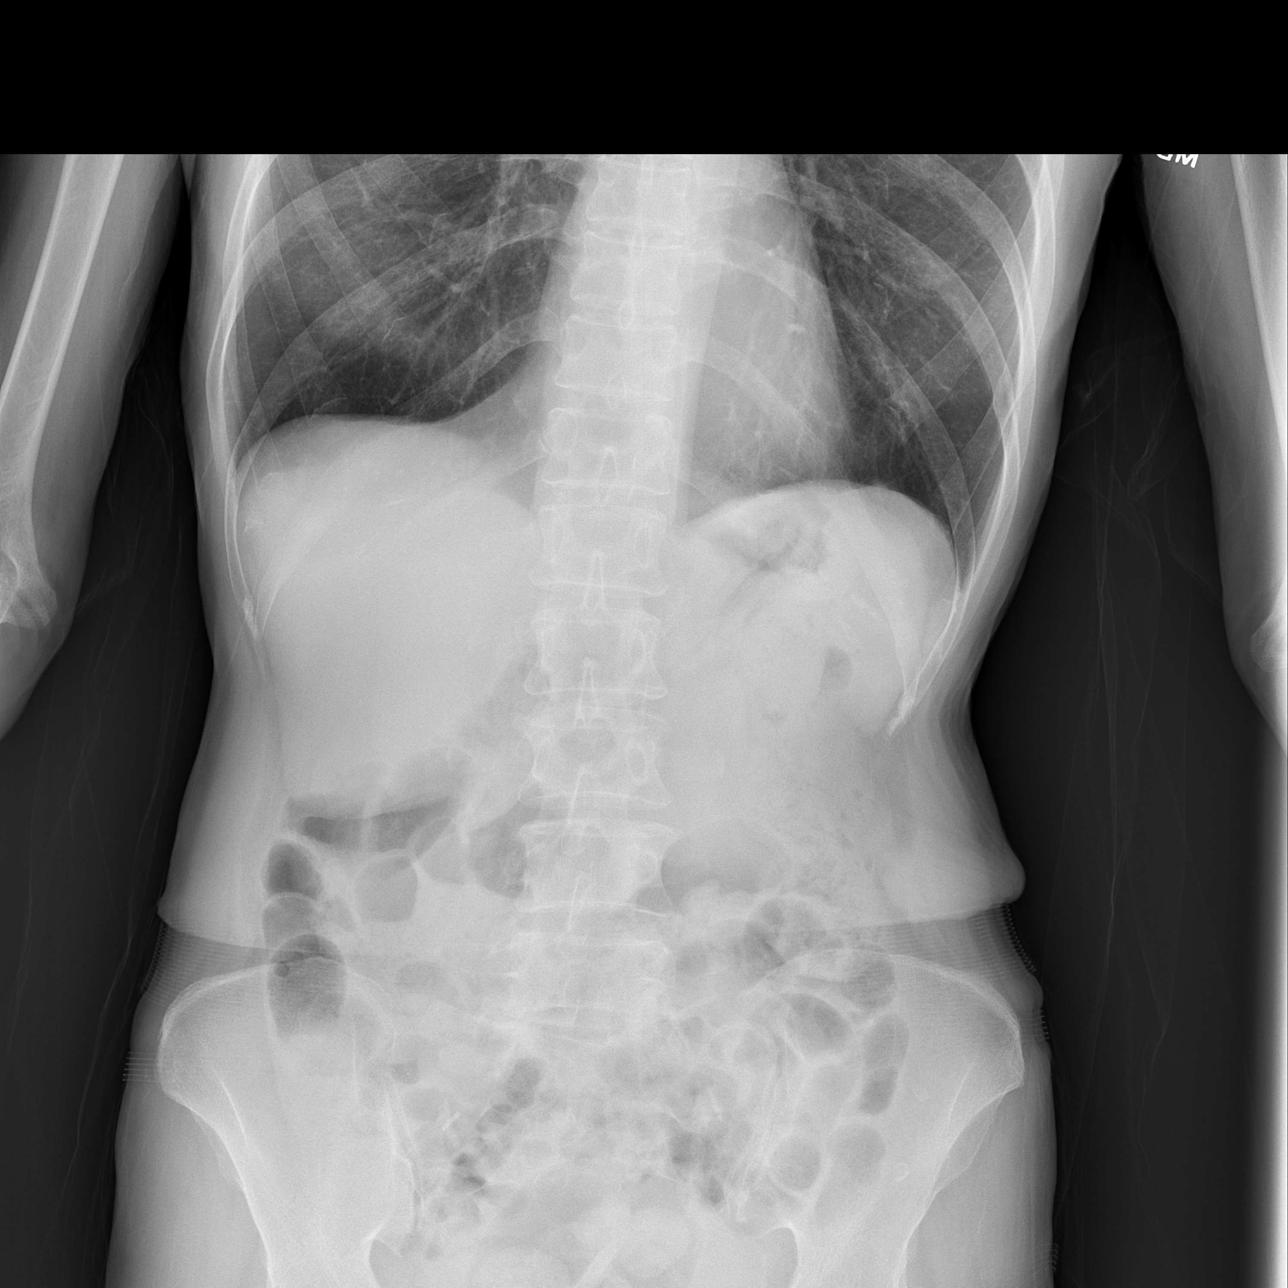

[t abdomen supine]
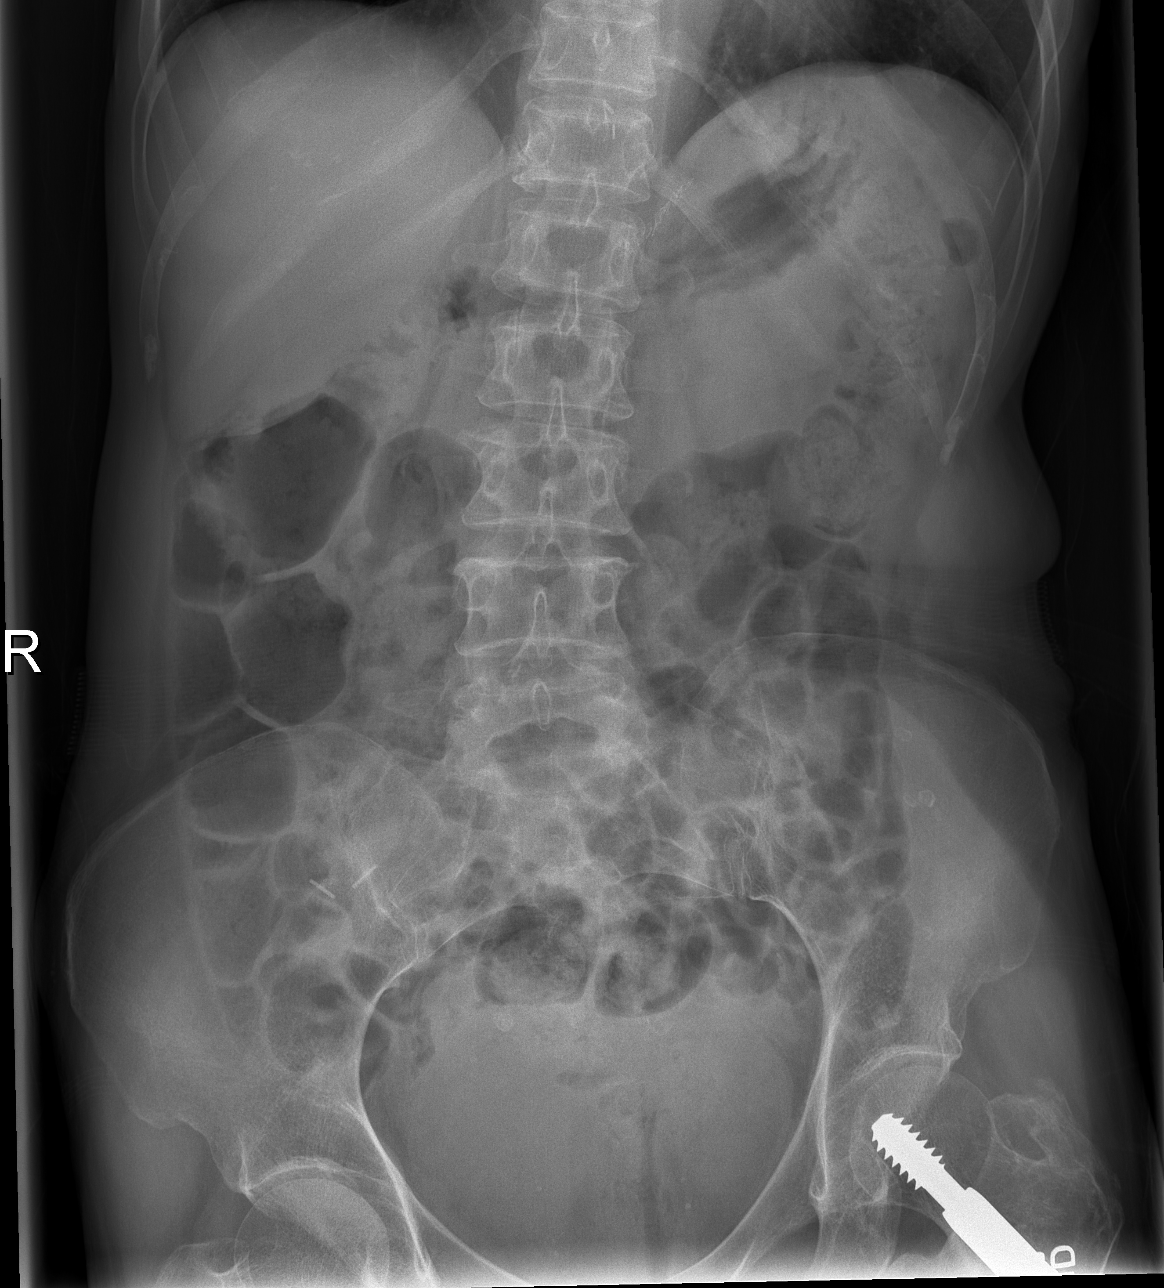

[3 of 3 positions shown; findings below may reference images not displayed]

FINDINGS: Hyperinflation.  The lungs are clear.  Heart and
mediastinal contours are normal.

Normal bowel gas.  Surgical sutures are seen at the
gastroesophageal junction.
IMPRESSION: 1.  Hyperinflation without acute cardiopulmonary process.
2.  Normal bowel gas.

## 2011-01-09 IMAGING — CR DG CHEST 1V PORT
1 series · 1 of 1 positions shown · non-contrast
Comparison: 03/20/2008

CLINICAL DATA: Chest pain

PORTABLE CHEST - 1 VIEW

[view not recorded]
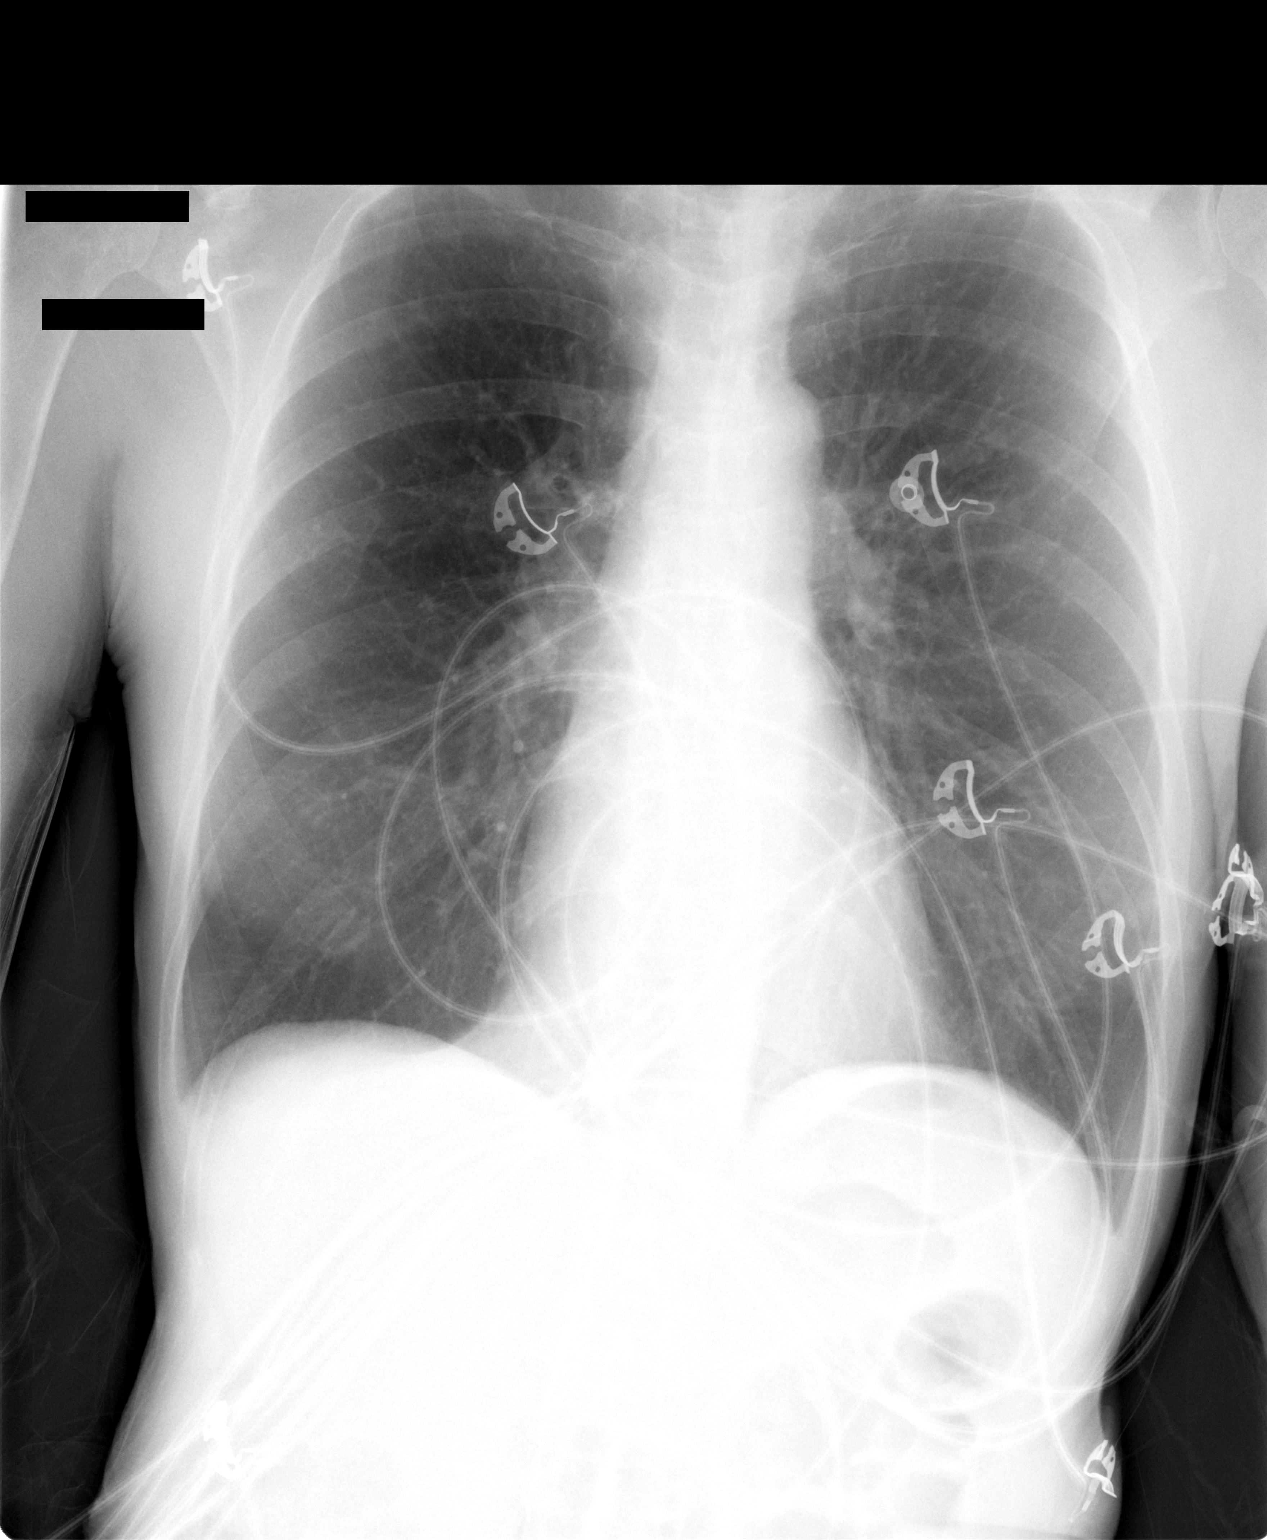

[1 of 1 positions shown; findings below may reference images not displayed]

FINDINGS: Focal density at the left apex is stable.  Lungs are
hyper aerated.  No new consolidation or nodules.  Heart is normal
in size.  No pneumothorax.
IMPRESSION: Hyperaeration.  No active cardiopulmonary disease.

## 2011-01-29 ENCOUNTER — Emergency Department (HOSPITAL_COMMUNITY)
Admission: EM | Admit: 2011-01-29 | Discharge: 2011-01-29 | Disposition: A | Discharge status: Home / Self Care | Payer: Medicaid Other | Attending: Emergency Medicine | Admitting: Emergency Medicine

## 2011-01-29 ENCOUNTER — Encounter (HOSPITAL_COMMUNITY): Payer: Self-pay | Admitting: Emergency Medicine

## 2011-01-29 DIAGNOSIS — R197 Diarrhea, unspecified: Secondary | ICD-10-CM | POA: Insufficient documentation

## 2011-01-29 DIAGNOSIS — F17200 Nicotine dependence, unspecified, uncomplicated: Secondary | ICD-10-CM | POA: Insufficient documentation

## 2011-01-29 DIAGNOSIS — R112 Nausea with vomiting, unspecified: Secondary | ICD-10-CM | POA: Insufficient documentation

## 2011-01-29 DIAGNOSIS — R10816 Epigastric abdominal tenderness: Secondary | ICD-10-CM | POA: Insufficient documentation

## 2011-01-29 DIAGNOSIS — R109 Unspecified abdominal pain: Secondary | ICD-10-CM

## 2011-01-29 LAB — COMPREHENSIVE METABOLIC PANEL
ALT: 12 U/L (ref 0–35)
AST: 22 U/L (ref 0–37)
Alkaline Phosphatase: 98 U/L (ref 39–117)
CO2: 23 mEq/L (ref 19–32)
Chloride: 99 mEq/L (ref 96–112)
GFR calc Af Amer: 79 mL/min — ABNORMAL LOW (ref >90)
GFR calc non Af Amer: 68 mL/min — ABNORMAL LOW (ref >90)
Glucose, Bld: 85 mg/dL (ref 70–99)
Potassium: 3.9 mEq/L (ref 3.5–5.1)
Sodium: 136 mEq/L (ref 135–145)
Total Bilirubin: 0.3 mg/dL (ref 0.3–1.2)

## 2011-01-29 LAB — DIFFERENTIAL
Basophils Absolute: 0 10*3/uL (ref 0.0–0.1)
Eosinophils Relative: 1 % (ref 0–5)
Lymphocytes Relative: 27 % (ref 12–46)
Lymphs Abs: 1.4 10*3/uL (ref 0.7–4.0)
Neutro Abs: 3.1 10*3/uL (ref 1.7–7.7)
Neutrophils Relative %: 63 % (ref 43–77)

## 2011-01-29 LAB — CBC
MCV: 93.8 fL (ref 78.0–100.0)
Platelets: 137 10*3/uL — ABNORMAL LOW (ref 150–400)
RBC: 4.65 MIL/uL (ref 3.87–5.11)
WBC: 4.9 10*3/uL (ref 4.0–10.5)

## 2011-01-29 MED ORDER — HYDROMORPHONE HCL PF 1 MG/ML IJ SOLN
1.0000 mg | Freq: Once | INTRAMUSCULAR | Status: AC
  Administered 2011-01-29: 1 mg via INTRAVENOUS

## 2011-01-29 MED ORDER — PROMETHAZINE HCL 25 MG PO TABS: 25.0000 mg | ORAL_TABLET | Freq: Three times a day (TID) | ORAL | Status: DC | PRN

## 2011-01-29 MED ORDER — HYDROMORPHONE HCL PF 1 MG/ML IJ SOLN
1.0000 mg | Freq: Once | INTRAMUSCULAR | Status: AC
Start: 2011-01-29 — End: 2011-01-29
  Administered 2011-01-29: 1 mg via INTRAVENOUS
  Filled 2011-01-29: qty 1

## 2011-01-29 MED ORDER — ONDANSETRON HCL 4 MG/2ML IJ SOLN
4.0000 mg | Freq: Once | INTRAMUSCULAR | Status: AC
  Administered 2011-01-29: 4 mg via INTRAVENOUS

## 2011-01-29 MED ORDER — SODIUM CHLORIDE 0.9 % IV BOLUS (SEPSIS)
1000.0000 mL | Freq: Once | INTRAVENOUS | Status: AC
  Administered 2011-01-29: 1000 mL via INTRAVENOUS

## 2011-01-29 MED ORDER — HYDROMORPHONE HCL PF 1 MG/ML IJ SOLN
1.0000 mg | Freq: Once | INTRAMUSCULAR | Status: AC
  Administered 2011-01-29: 1 mg via INTRAVENOUS
  Filled 2011-01-29: qty 1

## 2011-01-29 MED ORDER — HYDROMORPHONE HCL PF 1 MG/ML IJ SOLN
INTRAMUSCULAR | Status: AC
  Administered 2011-01-29: 1 mg
  Filled 2011-01-29: qty 1

## 2011-01-29 MED ORDER — ONDANSETRON HCL 4 MG/2ML IJ SOLN
4.0000 mg | Freq: Once | INTRAMUSCULAR | Status: AC
  Administered 2011-01-29: 4 mg via INTRAVENOUS
  Filled 2011-01-29: qty 2

## 2011-01-29 MED ORDER — OXYCODONE-ACETAMINOPHEN 5-325 MG PO TABS: 1.0000 | ORAL_TABLET | ORAL | Status: AC | PRN

## 2011-01-29 MED ORDER — ONDANSETRON HCL 4 MG/2ML IJ SOLN
INTRAMUSCULAR | Status: AC
  Administered 2011-01-29: 4 mg
  Filled 2011-01-29: qty 2

## 2011-01-29 NOTE — ED Notes (Signed)
Pt presenting to ed via ems with c/o abdominal pain, nausea, vomiting and diarrhea onset this am.

## 2011-01-29 NOTE — ED Notes (Signed)
-    Pt AMA;   All pertinent instructions rendered and pt comprehended

## 2011-01-29 NOTE — ED Notes (Signed)
Called pt at home and informed that her paper work is at the front window/triage area per Dr. Patrica Duel.

## 2011-01-29 NOTE — ED Notes (Signed)
Pt presenting to ed with c/o abdominal pain, nausea , vomiting and diarrhea pt states it feels like knives are being stuck in her abdomen. Pt is alert and oriented at this time. Pt states it feels like a pancreatitis flare up

## 2011-01-29 NOTE — ED Notes (Signed)
Pt states "I feel a little bit better"

## 2011-01-29 NOTE — ED Provider Notes (Addendum)
History     CSN: [id number]  Arrival date & time 01/29/11  [id number]   First MD Initiated Contact with Patient 01/29/11 [phone]      Chief Complaint  Patient presents with  . Abdominal Pain   patient states she awoke at 3 AM with acute onset of nausea and vomiting. She also had watery diarrhea. She is having sharp pains throughout her epigastrium and nonradiating. Denies any recent travel or any sick contacts. She's had no fever or chest pain. Denies any abnormal food consumption recently. Patient states she has a remote history of alcohol abuse. Initially thought this was "my pancreatitis"  (Consider location/radiation/quality/duration/timing/severity/associated sxs/prior treatment) HPI  Past Medical History  Diagnosis Date  . Pancreatitis   . COPD (chronic obstructive pulmonary disease)   . Substance abuse   . Cancer     Past Surgical History  Procedure Date  . Partial gastrectomy     No family history on file.  History  Substance Use Topics  . Smoking status: Current Everyday Smoker -- 0.5 packs/day    Types: Cigarettes  . Smokeless tobacco: Never Used  . Alcohol Use: No     Hx of Alcohol abuse    OB History    Grav Para Term Preterm Abortions TAB SAB Ect Mult Living                  Review of Systems  All other systems reviewed and are negative.    Allergies  Chlorpromazine hcl  Home Medications   Current Outpatient Rx  Name Route Sig Dispense Refill  . ALBUTEROL SULFATE HFA 108 (90 BASE) MCG/ACT IN AERS Inhalation Inhale 2 puffs into the lungs every 4 (four) hours as needed. For shortness of breath.     Marland Kitchen HYDROMORPHONE HCL 4 MG PO TABS Oral Take 4 mg by mouth every 6 (six) hours as needed. For pain.     Marland Kitchen LORAZEPAM 1 MG PO TABS Oral Take 1 mg by mouth every 6 (six) hours as needed. For anxiety.    Marland Kitchen ONDANSETRON HCL 4 MG PO TABS Oral Take 4 mg by mouth every 8 (eight) hours as needed. For nausea.      BP 151/92  Pulse 78  Temp(Src) 98.2 F (36.8 C)  (Oral)  Resp 18  SpO2 100%  Physical Exam  Nursing note and vitals reviewed. Constitutional: She is oriented to person, place, and time. She appears well-developed and well-nourished.  HENT:  Head: Normocephalic and atraumatic.  Eyes: Conjunctivae and EOM are normal. Pupils are equal, round, and reactive to light.  Neck: Neck supple.  Cardiovascular: Normal rate and regular rhythm.  Exam reveals no gallop and no friction rub.   No murmur heard. Pulmonary/Chest: Breath sounds normal. She has no wheezes. She has no rales. She exhibits no tenderness.  Abdominal: Soft. Bowel sounds are normal. She exhibits no distension. There is tenderness. There is no rebound and no guarding.       Bowel sounds are normal. Diffuse tenderness to palpation of the epigastric region. There is no rebound, rigidity or guarding  Musculoskeletal: Normal range of motion.  Neurological: She is alert and oriented to person, place, and time. No cranial nerve deficit. Coordination normal.  Skin: Skin is warm and dry. No rash noted.  Psychiatric: She has a normal mood and affect.    ED Course  Procedures (including critical care time)  Labs Reviewed  CBC - Abnormal; Notable for the following:    Hemoglobin 15.3 (*)  Platelets 137 (*)    All other components within normal limits  COMPREHENSIVE METABOLIC PANEL - Abnormal; Notable for the following:    GFR calc non Af Amer 68 (*)    GFR calc Af Amer 79 (*)    All other components within normal limits  DIFFERENTIAL  LIPASE, BLOOD   No results found.   No diagnosis found.    MDM  Pt is seen and examined;  Initial history and physical completed.  Will follow.        Results for orders placed during the hospital encounter of 01/29/11  CBC      Component Value Range   WBC 4.9  4.0 - 10.5 (K/uL)   RBC 4.65  3.87 - 5.11 (MIL/uL)   Hemoglobin 15.3 (*) 12.0 - 15.0 (g/dL)   HCT 16.1  09.6 - 04.5 (%)   MCV 93.8  78.0 - 100.0 (fL)   MCH 32.9  26.0 -  34.0 (pg)   MCHC 35.1  30.0 - 36.0 (g/dL)   RDW 40.9  81.1 - 91.4 (%)   Platelets 137 (*) 150 - 400 (K/uL)  DIFFERENTIAL      Component Value Range   Neutrophils Relative 63  43 - 77 (%)   Neutro Abs 3.1  1.7 - 7.7 (K/uL)   Lymphocytes Relative 27  12 - 46 (%)   Lymphs Abs 1.4  0.7 - 4.0 (K/uL)   Monocytes Relative 8  3 - 12 (%)   Monocytes Absolute 0.4  0.1 - 1.0 (K/uL)   Eosinophils Relative 1  0 - 5 (%)   Eosinophils Absolute 0.1  0.0 - 0.7 (K/uL)   Basophils Relative 1  0 - 1 (%)   Basophils Absolute 0.0  0.0 - 0.1 (K/uL)  COMPREHENSIVE METABOLIC PANEL      Component Value Range   Sodium 136  135 - 145 (mEq/L)   Potassium 3.9  3.5 - 5.1 (mEq/L)   Chloride 99  96 - 112 (mEq/L)   CO2 23  19 - 32 (mEq/L)   Glucose, Bld 85  70 - 99 (mg/dL)   BUN 13  6 - 23 (mg/dL)   Creatinine, Ser 7.82  0.50 - 1.10 (mg/dL)   Calcium 9.9  8.4 - 95.6 (mg/dL)   Total Protein 7.8  6.0 - 8.3 (g/dL)   Albumin 4.1  3.5 - 5.2 (g/dL)   AST 22  0 - 37 (U/L)   ALT 12  0 - 35 (U/L)   Alkaline Phosphatase 98  39 - 117 (U/L)   Total Bilirubin 0.3  0.3 - 1.2 (mg/dL)   GFR calc non Af Amer 68 (*) >90 (mL/min)   GFR calc Af Amer 79 (*) >90 (mL/min)  LIPASE, BLOOD      Component Value Range   Lipase 51  11 - 59 (U/L)   No results found.  1:35 PM Reassess in the room. Lab studies are discussed, lipase is normal, CMP is normal. CBC is normal, slightly low at 137. Patient, states she's feeling better, and would like to go home  Vannah Nadal A. Patrica Duel, MD 01/29/11 1336    Jenine Krisher A. Patrica Duel, MD 01/29/11 1352

## 2011-01-29 NOTE — ED Notes (Signed)
Bed:WA08<BR> Expected date:<BR> Expected time:<BR> Means of arrival:<BR> Comments:<BR> Ems/ abd pain

## 2011-02-25 ENCOUNTER — Inpatient Hospital Stay (HOSPITAL_COMMUNITY)
Admission: EM | Admit: 2011-02-25 | Discharge: 2011-03-01 | DRG: 439 | Disposition: A | Discharge status: Home / Self Care | Payer: Medicaid Other | Attending: Internal Medicine | Admitting: Internal Medicine

## 2011-02-25 ENCOUNTER — Other Ambulatory Visit: Payer: Self-pay

## 2011-02-25 ENCOUNTER — Encounter (HOSPITAL_COMMUNITY): Payer: Self-pay | Admitting: Emergency Medicine

## 2011-02-25 ENCOUNTER — Emergency Department (HOSPITAL_COMMUNITY): Payer: Medicaid Other

## 2011-02-25 DIAGNOSIS — D696 Thrombocytopenia, unspecified: Secondary | ICD-10-CM | POA: Diagnosis present

## 2011-02-25 DIAGNOSIS — Z79899 Other long term (current) drug therapy: Secondary | ICD-10-CM

## 2011-02-25 DIAGNOSIS — F1011 Alcohol abuse, in remission: Secondary | ICD-10-CM | POA: Diagnosis present

## 2011-02-25 DIAGNOSIS — J441 Chronic obstructive pulmonary disease with (acute) exacerbation: Secondary | ICD-10-CM | POA: Diagnosis present

## 2011-02-25 DIAGNOSIS — F411 Generalized anxiety disorder: Secondary | ICD-10-CM | POA: Diagnosis present

## 2011-02-25 DIAGNOSIS — E876 Hypokalemia: Secondary | ICD-10-CM | POA: Diagnosis present

## 2011-02-25 DIAGNOSIS — R109 Unspecified abdominal pain: Secondary | ICD-10-CM | POA: Diagnosis present

## 2011-02-25 DIAGNOSIS — K861 Other chronic pancreatitis: Secondary | ICD-10-CM | POA: Diagnosis present

## 2011-02-25 DIAGNOSIS — D72819 Decreased white blood cell count, unspecified: Secondary | ICD-10-CM | POA: Diagnosis present

## 2011-02-25 DIAGNOSIS — Z8711 Personal history of peptic ulcer disease: Secondary | ICD-10-CM

## 2011-02-25 DIAGNOSIS — Z85528 Personal history of other malignant neoplasm of kidney: Secondary | ICD-10-CM

## 2011-02-25 DIAGNOSIS — R64 Cachexia: Secondary | ICD-10-CM | POA: Diagnosis present

## 2011-02-25 DIAGNOSIS — I10 Essential (primary) hypertension: Secondary | ICD-10-CM | POA: Diagnosis present

## 2011-02-25 DIAGNOSIS — R112 Nausea with vomiting, unspecified: Secondary | ICD-10-CM | POA: Diagnosis present

## 2011-02-25 DIAGNOSIS — K859 Acute pancreatitis without necrosis or infection, unspecified: Principal | ICD-10-CM | POA: Diagnosis present

## 2011-02-25 DIAGNOSIS — K7460 Unspecified cirrhosis of liver: Secondary | ICD-10-CM | POA: Diagnosis present

## 2011-02-25 DIAGNOSIS — F17200 Nicotine dependence, unspecified, uncomplicated: Secondary | ICD-10-CM | POA: Diagnosis present

## 2011-02-25 DIAGNOSIS — K7030 Alcoholic cirrhosis of liver without ascites: Secondary | ICD-10-CM | POA: Diagnosis present

## 2011-02-25 DIAGNOSIS — K219 Gastro-esophageal reflux disease without esophagitis: Secondary | ICD-10-CM | POA: Diagnosis present

## 2011-02-25 LAB — COMPREHENSIVE METABOLIC PANEL
ALT: 40 U/L — ABNORMAL HIGH (ref 0–35)
AST: 51 U/L — ABNORMAL HIGH (ref 0–37)
Albumin: 4.4 g/dL (ref 3.5–5.2)
Alkaline Phosphatase: 115 U/L (ref 39–117)
BUN: 11 mg/dL (ref 6–23)
CO2: 22 mEq/L (ref 19–32)
Calcium: 9.6 mg/dL (ref 8.4–10.5)
Chloride: 98 mEq/L (ref 96–112)
Creatinine, Ser: 0.76 mg/dL (ref 0.50–1.10)
GFR calc Af Amer: >90 mL/min (ref >90)
GFR calc non Af Amer: >90 mL/min (ref >90)
Glucose, Bld: 120 mg/dL — ABNORMAL HIGH (ref 70–99)
Potassium: 4.1 mEq/L (ref 3.5–5.1)
Sodium: 135 mEq/L (ref 135–145)
Total Bilirubin: 0.4 mg/dL (ref 0.3–1.2)
Total Protein: 7.6 g/dL (ref 6.0–8.3)

## 2011-02-25 LAB — URINE MICROSCOPIC-ADD ON

## 2011-02-25 LAB — RAPID URINE DRUG SCREEN, HOSP PERFORMED
Barbiturates: NOT DETECTED
Benzodiazepines: POSITIVE — AB
Cocaine: NOT DETECTED
Opiates: POSITIVE — AB

## 2011-02-25 LAB — URINALYSIS, ROUTINE W REFLEX MICROSCOPIC
Bilirubin Urine: NEGATIVE
Glucose, UA: NEGATIVE mg/dL
Ketones, ur: NEGATIVE mg/dL
Leukocytes, UA: NEGATIVE
Nitrite: NEGATIVE
Protein, ur: NEGATIVE mg/dL
Specific Gravity, Urine: 1.017 (ref 1.005–1.030)
Urobilinogen, UA: 1 mg/dL (ref 0.0–1.0)
pH: 7 (ref 5.0–8.0)

## 2011-02-25 LAB — LIPASE, BLOOD: Lipase: 44 U/L (ref 11–59)

## 2011-02-25 MED ORDER — METHYLPREDNISOLONE SODIUM SUCC 125 MG IJ SOLR
60.0000 mg | Freq: Four times a day (QID) | INTRAMUSCULAR | Status: DC
  Administered 2011-02-25 – 2011-02-26 (×4): 60 mg via INTRAVENOUS
  Filled 2011-02-25 (×10): qty 0.96

## 2011-02-25 MED ORDER — ONDANSETRON HCL 4 MG/2ML IJ SOLN
4.0000 mg | Freq: Once | INTRAMUSCULAR | Status: AC
  Administered 2011-02-25: 4 mg via INTRAVENOUS
  Filled 2011-02-25: qty 2

## 2011-02-25 MED ORDER — METHYLPREDNISOLONE SODIUM SUCC 125 MG IJ SOLR: 125.0000 mg | Freq: Once | INTRAMUSCULAR | Status: DC

## 2011-02-25 MED ORDER — FLUTICASONE PROPIONATE HFA 110 MCG/ACT IN AERO
2.0000 | INHALATION_SPRAY | Freq: Two times a day (BID) | RESPIRATORY_TRACT | Status: DC
  Administered 2011-02-25 – 2011-03-01 (×7): 2 via RESPIRATORY_TRACT
  Filled 2011-02-25: qty 12

## 2011-02-25 MED ORDER — ALBUTEROL SULFATE (5 MG/ML) 0.5% IN NEBU
2.5000 mg | INHALATION_SOLUTION | Freq: Four times a day (QID) | RESPIRATORY_TRACT | Status: DC
  Administered 2011-02-25: 2.5 mg via RESPIRATORY_TRACT
  Filled 2011-02-25: qty 0.5

## 2011-02-25 MED ORDER — SODIUM CHLORIDE 0.9 % IV BOLUS (SEPSIS): 1000.0000 mL | Freq: Once | INTRAVENOUS | Status: DC

## 2011-02-25 MED ORDER — ALBUTEROL SULFATE (5 MG/ML) 0.5% IN NEBU
2.5000 mg | INHALATION_SOLUTION | Freq: Four times a day (QID) | RESPIRATORY_TRACT | Status: DC
  Administered 2011-02-25 – 2011-02-28 (×9): 2.5 mg via RESPIRATORY_TRACT
  Filled 2011-02-25 (×11): qty 0.5

## 2011-02-25 MED ORDER — ACETAMINOPHEN 325 MG PO TABS: 650.0000 mg | ORAL_TABLET | Freq: Four times a day (QID) | ORAL | Status: DC | PRN

## 2011-02-25 MED ORDER — NICOTINE 21 MG/24HR TD PT24
21.0000 mg | MEDICATED_PATCH | Freq: Every day | TRANSDERMAL | Status: DC
  Administered 2011-02-25 – 2011-03-01 (×5): 21 mg via TRANSDERMAL
  Filled 2011-02-25 (×6): qty 1

## 2011-02-25 MED ORDER — MORPHINE SULFATE 4 MG/ML IJ SOLN
4.0000 mg | Freq: Once | INTRAMUSCULAR | Status: AC
  Administered 2011-02-25: 4 mg via INTRAVENOUS
  Filled 2011-02-25: qty 1

## 2011-02-25 MED ORDER — SODIUM CHLORIDE 0.9 % IJ SOLN
3.0000 mL | Freq: Two times a day (BID) | INTRAMUSCULAR | Status: DC
  Administered 2011-02-25: 3 mL via INTRAVENOUS

## 2011-02-25 MED ORDER — ALBUTEROL SULFATE (5 MG/ML) 0.5% IN NEBU: 2.5000 mg | INHALATION_SOLUTION | RESPIRATORY_TRACT | Status: DC | PRN

## 2011-02-25 MED ORDER — ALUM & MAG HYDROXIDE-SIMETH 200-200-20 MG/5ML PO SUSP: 30.0000 mL | Freq: Four times a day (QID) | ORAL | Status: DC | PRN

## 2011-02-25 MED ORDER — IPRATROPIUM BROMIDE 0.02 % IN SOLN
0.5000 mg | Freq: Four times a day (QID) | RESPIRATORY_TRACT | Status: DC
  Administered 2011-02-25: 0.5 mg via RESPIRATORY_TRACT
  Filled 2011-02-25: qty 2.5

## 2011-02-25 MED ORDER — ACETAMINOPHEN 650 MG RE SUPP: 650.0000 mg | Freq: Four times a day (QID) | RECTAL | Status: DC | PRN

## 2011-02-25 MED ORDER — LORAZEPAM 2 MG/ML IJ SOLN
1.0000 mg | Freq: Once | INTRAMUSCULAR | Status: AC
  Administered 2011-02-25: 1 mg via INTRAVENOUS
  Filled 2011-02-25: qty 1

## 2011-02-25 MED ORDER — HYDRALAZINE HCL 20 MG/ML IJ SOLN
10.0000 mg | Freq: Four times a day (QID) | INTRAMUSCULAR | Status: DC | PRN
  Administered 2011-02-28: 10 mg via INTRAVENOUS
  Filled 2011-02-25: qty 1

## 2011-02-25 MED ORDER — IPRATROPIUM BROMIDE 0.02 % IN SOLN
0.5000 mg | Freq: Four times a day (QID) | RESPIRATORY_TRACT | Status: DC
  Administered 2011-02-25 – 2011-02-28 (×9): 0.5 mg via RESPIRATORY_TRACT
  Filled 2011-02-25 (×11): qty 2.5

## 2011-02-25 MED ORDER — HYDROMORPHONE HCL PF 1 MG/ML IJ SOLN
1.0000 mg | INTRAMUSCULAR | Status: DC | PRN
  Administered 2011-02-25: 2 mg via INTRAVENOUS
  Administered 2011-02-25: 1 mg via INTRAVENOUS
  Administered 2011-02-26 (×2): 2 mg via INTRAVENOUS
  Administered 2011-02-26 (×2): 1 mg via INTRAVENOUS
  Filled 2011-02-25: qty 2
  Filled 2011-02-25: qty 1
  Filled 2011-02-25: qty 2
  Filled 2011-02-25: qty 1
  Filled 2011-02-25 (×2): qty 2

## 2011-02-25 MED ORDER — LORAZEPAM 2 MG/ML IJ SOLN
1.0000 mg | Freq: Four times a day (QID) | INTRAMUSCULAR | Status: DC | PRN
  Administered 2011-02-25 – 2011-03-01 (×11): 1 mg via INTRAVENOUS
  Filled 2011-02-25 (×11): qty 1

## 2011-02-25 MED ORDER — HYDROMORPHONE HCL PF 1 MG/ML IJ SOLN
INTRAMUSCULAR | Status: AC
  Administered 2011-02-25: 1 mg
  Filled 2011-02-25: qty 1

## 2011-02-25 MED ORDER — HYDROMORPHONE HCL PF 2 MG/ML IJ SOLN
2.0000 mg | Freq: Once | INTRAMUSCULAR | Status: AC
  Administered 2011-02-25: 2 mg via INTRAVENOUS
  Filled 2011-02-25: qty 1

## 2011-02-25 MED ORDER — HYDROCODONE-ACETAMINOPHEN 5-325 MG PO TABS
1.0000 | ORAL_TABLET | ORAL | Status: DC | PRN
  Administered 2011-02-25 – 2011-02-28 (×13): 2 via ORAL
  Filled 2011-02-25 (×13): qty 2

## 2011-02-25 MED ORDER — PANTOPRAZOLE SODIUM 40 MG IV SOLR
40.0000 mg | Freq: Two times a day (BID) | INTRAVENOUS | Status: DC
  Administered 2011-02-25 – 2011-02-28 (×6): 40 mg via INTRAVENOUS
  Filled 2011-02-25 (×7): qty 40

## 2011-02-25 MED ORDER — ONDANSETRON HCL 4 MG/2ML IJ SOLN
4.0000 mg | INTRAMUSCULAR | Status: DC | PRN
  Administered 2011-02-25 – 2011-03-01 (×13): 4 mg via INTRAVENOUS
  Filled 2011-02-25 (×13): qty 2

## 2011-02-25 MED ORDER — SODIUM CHLORIDE 0.9 % IV SOLN
INTRAVENOUS | Status: DC
  Administered 2011-02-26 – 2011-02-27 (×2): via INTRAVENOUS

## 2011-02-25 MED ORDER — HYDROMORPHONE HCL PF 1 MG/ML IJ SOLN
1.0000 mg | Freq: Once | INTRAMUSCULAR | Status: AC
  Administered 2011-02-25: 1 mg via INTRAVENOUS
  Filled 2011-02-25: qty 1

## 2011-02-25 MED ORDER — ONDANSETRON HCL 4 MG PO TABS: 4.0000 mg | ORAL_TABLET | ORAL | Status: DC | PRN

## 2011-02-25 NOTE — Consult Note (Signed)
Reason for Referral: Thrombocytopenia.  HPI: Patient is a 61 year old female of [place] with history of chronic pancreatitis, COPD, history of kidney cancer S/P RFA by Dr. Fredia Sorrow. She have had multiple admits with pancreatitis.She presented to [facility] emergency room with acute on chronic abdominal pain. Patient has a history of chronic pancreatitis and at baseline has abdominal pain, epigastric 6/10 on 'good days', has been having worsening of abdominal pain for the last 3 days. In the ED her evaluation, she was found to have platelet count of 41K. She is reporting no bleeding, some infrequesnt bruising.  She reports having a beer 2 days ago. No other drugs.   Past Medical History  Diagnosis Date  . Pancreatitis   . COPD (chronic obstructive pulmonary disease)   . Substance abuse   . Cancer     renal ca  :  Past Surgical History  Procedure Date  . Partial gastrectomy   :  Current facility-administered medications:albuterol (PROVENTIL) (5 MG/ML) 0.5% nebulizer solution 2.5 mg, 2.5 mg, Nebulization, Q6H, Ripudeep K Rai, MD;  HYDROmorphone (DILAUDID) 1 MG/ML injection, , , , , 1 mg at 02/25/11 1314;  HYDROmorphone (DILAUDID) injection 1 mg, 1 mg, Intravenous, Once, Raeford Razor, MD, 1 mg at 02/25/11 1139;  HYDROmorphone (DILAUDID) injection 2 mg, 2 mg, Intravenous, Once, Raeford Razor, MD, 2 mg at 02/25/11 1510 ipratropium (ATROVENT) nebulizer solution 0.5 mg, 0.5 mg, Nebulization, Q6H, Ripudeep K Rai, MD;  LORazepam (ATIVAN) injection 1 mg, 1 mg, Intravenous, Once, Raeford Razor, MD, 1 mg at 02/25/11 1137;  methylPREDNISolone sodium succinate (SOLU-MEDROL) 125 MG injection 125 mg, 125 mg, Intravenous, Once, Ripudeep K Rai, MD;  morphine 4 MG/ML injection 4 mg, 4 mg, Intravenous, Once, Raeford Razor, MD, 4 mg at 02/25/11 1101 ondansetron [facility]) injection 4 mg, 4 mg, Intravenous, Once, Raeford Razor, MD, 4 mg at 02/25/11 1102;  ondansetron [facility]) injection 4 mg, 4 mg,  Intravenous, Once, Raeford Razor, MD, 4 mg at 02/25/11 1136;  ondansetron [facility]) injection 4 mg, 4 mg, Intravenous, Once, Raeford Razor, MD, 4 mg at 02/25/11 1509;  sodium chloride 0.9 % bolus 1,000 mL, 1,000 mL, Intravenous, Once, Raeford Razor, MD Current outpatient prescriptions:albuterol (PROVENTIL HFA;VENTOLIN HFA) 108 (90 BASE) MCG/ACT inhaler, Inhale 2 puffs into the lungs every 4 (four) hours as needed. For shortness of breath. , Disp: , Rfl: ;  HYDROmorphone (DILAUDID) 4 MG tablet, Take 4 mg by mouth every 6 (six) hours as needed. For pain. , Disp: , Rfl: ;  LORazepam (ATIVAN) 1 MG tablet, Take 1 mg by mouth every 6 (six) hours as needed. For anxiety., Disp: , Rfl:  ondansetron (ZOFRAN) 4 MG tablet, Take 4 mg by mouth every 8 (eight) hours as needed. For nausea., Disp: , Rfl: :     . albuterol  2.5 mg Nebulization Q6H  . HYDROmorphone      .  HYDROmorphone (DILAUDID) injection  1 mg Intravenous Once  .  HYDROmorphone (DILAUDID) injection  2 mg Intravenous Once  . ipratropium  0.5 mg Nebulization Q6H  . LORazepam  1 mg Intravenous Once  . methylPREDNISolone (SOLU-MEDROL) injection  125 mg Intravenous Once  .  morphine injection  4 mg Intravenous Once  . ondansetron (ZOFRAN) IV  4 mg Intravenous Once  . ondansetron (ZOFRAN) IV  4 mg Intravenous Once  . ondansetron (ZOFRAN) IV  4 mg Intravenous Once  . sodium chloride  1,000 mL Intravenous Once  :  Allergies  Allergen Reactions  . Chlorpromazine Hcl Other (  See Comments)    Muscle spasms  :  History reviewed. No pertinent family history.:  History   Social History  . Marital Status: Married    Spouse Name: N/A    Number of Children: N/A  . Years of Education: N/A   Occupational History  . Not on file.   Social History Main Topics  . Smoking status: Current Everyday Smoker -- 0.5 packs/day    Types: Cigarettes  . Smokeless tobacco: Never Used  . Alcohol Use: No     Hx of Alcohol abuse  . Drug Use: Yes     Hx of  polysubstance drug abuse  . Sexually Active: No   Other Topics Concern  . Not on file   Social History Narrative  . No narrative on file  :  Constitutional: positive for anorexia Eyes: negative Ears, nose, mouth, throat, and face: negative Respiratory: positive for chronic bronchitis Cardiovascular: negative Gastrointestinal: positive for abdominal pain Genitourinary:negative Musculoskeletal:negative  Exam: Patient Vitals for the past 24 hrs:  BP Temp Temp src Pulse Resp SpO2  02/25/11 1643 162/111 mmHg - - 84  20  98 %  02/25/11 1244 154/102 mmHg - - 85  20  100 %  02/25/11 1031 206/105 mmHg 98.4 F (36.9 C) Oral 111  18  100 %   General appearance: alert, appears older than stated age and mild distress Head: Normocephalic, without obvious abnormality, atraumatic Eyes: conjunctivae/corneas clear. PERRL, EOM's intact. Fundi benign. Nose: Nares normal. Septum midline. Mucosa normal. No drainage or sinus tenderness. Throat: lips, mucosa, and tongue normal; teeth and gums normal Neck: no adenopathy, no carotid bruit, no JVD, supple, symmetrical, trachea midline and thyroid not enlarged, symmetric, no tenderness/mass/nodules Resp: wheezes bilaterally Chest wall: no tenderness Cardio: regular rate and rhythm, S1, S2 normal, no murmur, click, rub or gallop GI: soft, non-tender; bowel sounds normal; no masses,  no organomegaly Extremities: extremities normal, atraumatic, no cyanosis or edema Skin: Skin color, texture, turgor normal. No rashes or lesions Lymph nodes: Cervical, supraclavicular, and axillary nodes normal.   Basename 02/25/11 1050  WBC 3.6*  HGB 14.0  HCT 39.9  PLT 41*    Basename 02/25/11 1050  NA 135  K 4.1  CL 98  CO2 22  GLUCOSE 120*  BUN 11  CREATININE 0.76  CALCIUM 9.6   Lab Results  Component Value Date   WBC 3.6* 02/25/2011   HGB 14.0 02/25/2011   HCT 39.9 02/25/2011   MCV 94.3 02/25/2011   PLT 41* 02/25/2011    Lab 02/25/11 1050  NA 135    K 4.1  CL 98  CO2 22  BUN 11  CREATININE 0.76  CALCIUM 9.6  PROT 7.6  BILITOT 0.4  ALKPHOS 115  ALT 40*  AST 51*  GLUCOSE 120*     Blood smear review: I personally reviewed the smear. There was no evidence RBC fragments or schistocytes. RBC morphology is normal. Platelet are decreased and enlarged in size. No platelet clumping.  WBC normal.     Ct Abdomen Pelvis Wo Contrast  02/25/2011  *RADIOLOGY REPORT*  Clinical Data: Mid abdominal pain.  History of chronic pancreatitis.  CT ABDOMEN AND PELVIS WITHOUT CONTRAST  Technique:  Multidetector CT imaging of the abdomen and pelvis was performed following the standard protocol without intravenous contrast.  Comparison: CT of the abdomen 11/27/2010.  CT of abdomen and pelvis 06/15/2010.  Findings:  Lung Bases: Unremarkable.  Abdomen/Pelvis:  In the medial aspect of the lower pole of the  right kidney there is a well-circumscribed 2.2 x 1.7 cm area of low attenuation (approximately 8 HU) with some thin peripheral calcification, that appears to represent a post radiofrequency ablation site.  No abnormal surrounding soft tissue to suggest local recurrence of disease.  No significant adjacent para-aortic lymphadenopathy.  The contralateral kidney is unremarkable in appearance.  No abnormal urinary tract calcifications or signs of hydroureteronephrosis.  The unenhanced appearance of the liver, gallbladder, pancreas, spleen and bilateral adrenal glands is unremarkable.  Extensive atherosclerosis of the abdominal and pelvic vasculature, without evidence of abdominal aortic aneurysm.  No ascites or pneumoperitoneum and no pathologic distension of bowel.  No definite pathologic adenopathy.  The patient is status post hysterectomy.  Ovaries are not confidently visualized and may be atrophic or may have been surgically removed as well.  Urinary bladder is unremarkable in appearance.  Musculoskeletal: Status post ORIF and left proximal femur.  Bones are mildly  osteopenic. There are no aggressive appearing lytic or blastic lesions noted in the visualized portions of the skeleton. A healing fracture of the posterior aspect of the left tenth rib is incidentally noted.  IMPRESSION:  1.  Expected postprocedural appearance of the lower pole left kidney related to prior radiofrequency ablation, with continued decrease in size of post RFA region.  No definitive signs of local recurrence of disease. 2.  Atherosclerosis. 3.  Status post hysterectomy and ORIF of left proximal femur.  Original Report Authenticated By: Florencia Reasons, M.D.    Assessment and Plan:   61 year old with:   Thrombocytopenia: the likely etiology is reactive to acute illness (pancreatitis) in the setting of history of alcohol abuse. Her base line platelet count range from 100-180K. Other causes include: ITP, medications (no history of Heparin exposure), bone marrow suppression due to alcohol abuse. Unlikely etiologies:  TTP/HUS (normal Cr and Bilirubin), DIC or HIT. Hematological condition like MDS, lymphoma, leukemia are extremely unlikely.   Recs: I will check LDH, PT, PTT. Coombs test. No need for transfusion unless platelet count is less than 10 K or active bleeding is noted. I believe treating her pancreatitis and decreasing the inflammatory state will help bring the platelet up.  Will follow with you.     [facility], MD 2/21/20135:05 PM

## 2011-02-25 NOTE — H&P (Signed)
History and Physical       Hospital Admission Note Date: 02/25/2011  Patient name: [name] Medical record number: [id number] Date of birth: 1950-12-20 Age: 61 y.o. Gender: female PCP: Billee Cashing, MD, MD  Attending physician: Baltazar Najjar, MD   Chief Complaint:  Nausea and vomiting with acute on chronic abdominal pain for last 3 days  HPI: Patient is a 61 year old female with history of chronic pancreatitis, COPD, history of kidney cancer, peptic ulcer disease presented to [facility] emergency room with acute on chronic abdominal pain. Patient has a history of chronic pancreatitis and at baseline has abdominal pain, epigastric 6/10 on 'good days', has been having worsening of abdominal pain for the last 3 days, rates as 9-10/10. The pain is associated with nausea and vomiting for the last 3 days. She denied any fevers or chills, hematemesis, hematochezia or melena or any epistaxis.  Hospitalist service was requested for evaluation of abdominal pain and incidental finding of thrombocytopenia with platelet count of 40K.  patient had a CBC drawn on 01/29/2011 when platelet count was 137. The patient states that she has noticed easy bruising, however no frank bleeding. She does have small petechiae on her arms. She denied any recent travels, starting any new medications, any recent illnesses, new foods. Patient reports only occasional alcohol use at home, however has a history of cirrhosis on her records (but abdominal ultrasound in February 2011 was unremarkable). In the ED, patient was also noted to have elevated BP readings, highest 206/105, but she denies any history of hypertension. On my exam patient was also noted to be wheezing, has a history of COPD   Review of Systems:  Constitutional: Denies fever, chills, diaphoresis, appetite change and fatigue.  HEENT: Denies photophobia, eye pain, redness, hearing loss, ear pain,  congestion, sore throat, rhinorrhea, sneezing, mouth sores, trouble swallowing, neck pain, neck stiffness and tinnitus.   Respiratory: See history of present illness, pt also reports dyspnea on exertion Cardiovascular: Denies chest pain, palpitations and leg swelling.  Gastrointestinal: See history of present illness Genitourinary: Denies dysuria, urgency, frequency, hematuria, flank pain and difficulty urinating.  Musculoskeletal: Denies myalgias, back pain, joint swelling, arthralgias and gait problem.  Skin: Petechiae on arms, Denies pallor, and wound.  Neurological: Denies dizziness, seizures, syncope, weakness, light-headedness, numbness and headaches.  Hematological: Denies adenopathy. Has Easy bruising, denies personal or family bleeding history  Psychiatric/Behavioral: Denies suicidal ideation, mood changes, confusion, nervousness, sleep disturbance and agitation  Past Medical History: Past Medical History  Diagnosis Date  . Pancreatitis   . COPD (chronic obstructive pulmonary disease)   . Substance abuse   . Cancer     renal ca   Past Surgical History  Procedure Date  . Partial gastrectomy     Medications: Prior to Admission medications   Medication Sig Start Date End Date Taking? Authorizing Provider  albuterol (PROVENTIL HFA;VENTOLIN HFA) 108 (90 BASE) MCG/ACT inhaler Inhale 2 puffs into the lungs every 4 (four) hours as needed. For shortness of breath.    Yes Historical Provider, MD  HYDROmorphone (DILAUDID) 4 MG tablet Take 4 mg by mouth every 6 (six) hours as needed. For pain.    Yes Historical Provider, MD  LORazepam (ATIVAN) 1 MG tablet Take 1 mg by mouth every 6 (six) hours as needed. For anxiety.   Yes Historical Provider, MD  ondansetron (ZOFRAN) 4 MG tablet Take 4 mg by mouth every 8 (eight) hours as needed. For nausea.   Yes Historical Provider, MD  Allergies:   Allergies  Allergen Reactions  . Chlorpromazine Hcl Other (See Comments)    Muscle spasms     Social History:  reports that she has been smoking Cigarettes.  She has been smoking about .5 packs per day. She has never used smokeless tobacco. She reports that she uses illicit drugs. She reports that she does not drink alcohol.  Family History: History reviewed. No pertinent family history.  Physical Exam: Blood pressure 154/102, pulse 85, temperature 98.4 F (36.9 C), temperature source Oral, resp. rate 20, SpO2 100.00%. General: Alert, awake, oriented x3, in no acute distress. HEENT: anicteric sclera, pink conjunctiva, pupils equal and reactive to light and accomodation Neck: supple, no masses or lymphadenopathy, no goiter, no bruits  Heart: Regular rate and rhythm, without murmurs, rubs or gallops. Lungs: Bilateral expiratory wheezing  Abdomen: Soft, tender in epigastric region, voluntary guarding, nondistended, positive bowel sounds, no masses. Extremities: No clubbing, cyanosis or edema with positive pedal pulses. Neuro: Grossly intact, no focal neurological deficits, strength 5/5 upper and lower extremities bilaterally Psych: alert and oriented x 3, normal mood and affect Skin: no rashes or lesions, warm and dry   LABS on Admission:  Basic Metabolic Panel:  Lab 02/25/11 4782  NA 135  K 4.1  CL 98  CO2 22  GLUCOSE 120*  BUN 11  CREATININE 0.76  CALCIUM 9.6  MG --  PHOS --   Liver Function Tests:  Lab 02/25/11 1050  AST 51*  ALT 40*  ALKPHOS 115  BILITOT 0.4  PROT 7.6  ALBUMIN 4.4    Lab 02/25/11 1050  LIPASE 44  AMYLASE --   CBC:  Lab 02/25/11 1050  WBC 3.6*  NEUTROABS --  HGB 14.0  HCT 39.9  MCV 94.3  PLT 41*    Radiological Exams on Admission: Ct Abdomen Pelvis Wo Contrast  02/25/2011  *RADIOLOGY REPORT*  Clinical Data: Mid abdominal pain.  History of chronic pancreatitis.  CT ABDOMEN AND PELVIS WITHOUT CONTRAST .  IMPRESSION:  1.  Expected postprocedural appearance of the lower pole left kidney related to prior radiofrequency  ablation, with continued decrease in size of post RFA region.  No definitive signs of local recurrence of disease. 2.  Atherosclerosis. 3.  Status post hysterectomy and ORIF of left proximal femur.  Original Report Authenticated By: Florencia Reasons, M.D.    Assessment/Plan  .Abdominal pain, acute/Nausea and vomiting: Likely secondary to acute on chronic pancreatitis  - Will place on IV fluids, pain control, antiemetics, obtain abdominal ultrasound.     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 Renal function stable, no blood smear available  - Obtain alcohol level, urine drug screen, DIC panel, peripheral blood smear, HIV, ESR, CRP, hepatitis panel, abdominal ultrasound to rule out any cirrhosis  - Patient is not actively bleeding, monitor counts, she does have mild leukopenia. - Hematology consult called, may need bone marrow exam for the diagnosis    .COPD with acute exacerbation and ongoing nicotine abuse: - Placed on albuterol and Atrovent nebulizer treatments, Flovent, IV Solu-Medrol  - Patient was counseled strongly for nicotine cessation, place on nicotine patch  .ANXIETY DISORDER: - Placed on Ativan IV when necessary   .GERD: - Placed on IV Protonix   .CIRRHOSIS: - Obtain abdominal ultrasound,    .PANCREATITIS, CHRONIC: Continue pain control   .HTN (hypertension), malignant: No prior history  - Placed on IV when necessary hydralazine   DVT prophylaxis:  SCDs   Further plan will depend as patient's clinical course evolves  and further radiologic and laboratory data become available.   @Time  Spent on Admission: 1 hour   Annisa Mazzarella M.D. [facility] 02/25/2011, 4:08 PM

## 2011-02-25 NOTE — ED Notes (Signed)
Per EMS, abdominal pain for 3 days

## 2011-02-25 NOTE — ED Provider Notes (Signed)
History    60yf with abdominal pain. Gradual onset about 3d ago. Constant. Diffuse and achy but worse in upper abdomen. No appreciable exacerbating or relieving factors. NO fever or chills. Nausea. A couple episodes of NBNB emesis. No diarrhea.   CSN: [id number]  Arrival date & time 02/25/11  1011   First MD Initiated Contact with Patient 02/25/11 1015      Chief Complaint  Patient presents with  . Abdominal Pain    (Consider location/radiation/quality/duration/timing/severity/associated sxs/prior treatment) HPI  Past Medical History  Diagnosis Date  . Pancreatitis   . COPD (chronic obstructive pulmonary disease)   . Substance abuse   . Cancer     Past Surgical History  Procedure Date  . Partial gastrectomy     No family history on file.  History  Substance Use Topics  . Smoking status: Current Everyday Smoker -- 0.5 packs/day    Types: Cigarettes  . Smokeless tobacco: Never Used  . Alcohol Use: No     Hx of Alcohol abuse    OB History    Grav Para Term Preterm Abortions TAB SAB Ect Mult Living                  Review of Systems   Review of symptoms negative unless otherwise noted in HPI.   Allergies  Chlorpromazine hcl  Home Medications   Current Outpatient Rx  Name Route Sig Dispense Refill  . ALBUTEROL SULFATE HFA 108 (90 BASE) MCG/ACT IN AERS Inhalation Inhale 2 puffs into the lungs every 4 (four) hours as needed. For shortness of breath.     Marland Kitchen HYDROMORPHONE HCL 4 MG PO TABS Oral Take 4 mg by mouth every 6 (six) hours as needed. For pain.     Marland Kitchen LORAZEPAM 1 MG PO TABS Oral Take 1 mg by mouth every 6 (six) hours as needed. For anxiety.    Marland Kitchen ONDANSETRON HCL 4 MG PO TABS Oral Take 4 mg by mouth every 8 (eight) hours as needed. For nausea.      There were no vitals taken for this visit.  Physical Exam  Nursing note and vitals reviewed. Constitutional: She appears well-developed and well-nourished. No distress.  HENT:  Head: Normocephalic and  atraumatic.  Eyes: Conjunctivae are normal. Pupils are equal, round, and reactive to light. Right eye exhibits no discharge. Left eye exhibits no discharge.  Neck: Neck supple.  Cardiovascular: Normal rate, regular rhythm and normal heart sounds.  Exam reveals no gallop and no friction rub.   No murmur heard. Pulmonary/Chest: Effort normal and breath sounds normal. No respiratory distress.  Abdominal: Soft. She exhibits no distension. There is tenderness.       Well healed abdominal surgical scar. No distension. Moderate tenderness without rebound or guarding periumbilically.  Musculoskeletal: She exhibits no edema and no tenderness.  Neurological: She is alert.  Skin: Skin is warm and dry.  Psychiatric: She has a normal mood and affect. Her behavior is normal. Thought content normal.    ED Course  Procedures (including critical care time)  Labs Reviewed  CBC - Abnormal; Notable for the following:    WBC 3.6 (*)    Platelets 41 (*)    All other components within normal limits  COMPREHENSIVE METABOLIC PANEL - Abnormal; Notable for the following:    Glucose, Bld 120 (*)    AST 51 (*)    ALT 40 (*)    All other components within normal limits  LIPASE, BLOOD  URINALYSIS, ROUTINE W  REFLEX MICROSCOPIC   No results found.  EKG:  Rhythm: normal sinus Rate: 70 Axis: left Intervals: RBBB ST segments: NS ST changes.  Comparison: 12/29/06, Little change.  1. Thrombocytopenia   2. Abdominal pain   3. Nausea & vomiting   4. ANXIETY DISORDER   5. PANCREATITIS, CHRONIC   6. Abdominal pain, acute       MDM  60yf with abdominal pain and n/v. Thrombocytopenia as incidental finding. Per pt and review of records no prior hx if this. 3w ago platelets 137,000 and 2 months ago 181. H/H normal. No clinical evidence of bleeding. Pt also with abdominal pain and n/v. Suspect exacerbation of chronic underlying pain. No acute findings on CT. Some difficulty with symptom control with multiple  doses of narcotics and antiemetics in ED. Given this and thrombocytopenia, will admit.        Raeford Razor, MD 03/03/11 [phone]

## 2011-02-25 NOTE — ED Notes (Signed)
Pt requesting more nausea/pain medication-states no relief

## 2011-02-25 NOTE — ED Notes (Signed)
Pt aware of need of urine sample.

## 2011-02-25 NOTE — ED Notes (Signed)
GNF:[id number]<YQ> Expected date:02/25/11<BR> Expected time: 9:57 AM<BR> Means of arrival:Ambulance<BR> Comments:<BR> vomiting

## 2011-02-26 ENCOUNTER — Inpatient Hospital Stay (HOSPITAL_COMMUNITY): Payer: Medicaid Other

## 2011-02-26 DIAGNOSIS — D696 Thrombocytopenia, unspecified: Secondary | ICD-10-CM

## 2011-02-26 DIAGNOSIS — R109 Unspecified abdominal pain: Secondary | ICD-10-CM

## 2011-02-26 LAB — CBC
HCT: 39.9 % (ref 36.0–46.0)
HCT: 35.2 % — ABNORMAL LOW (ref 36.0–46.0)
Hemoglobin: 14 g/dL (ref 12.0–15.0)
MCH: 33.1 pg (ref 26.0–34.0)
MCHC: 35.1 g/dL (ref 30.0–36.0)
MCHC: 33.5 g/dL (ref 30.0–36.0)
MCV: 94.3 fL (ref 78.0–100.0)
Platelets: 41 10*3/uL — ABNORMAL LOW (ref 150–400)
RBC: 4.23 MIL/uL (ref 3.87–5.11)
RDW: 13.3 % (ref 11.5–15.5)
RDW: 13.2 % (ref 11.5–15.5)
WBC: 3.6 10*3/uL — ABNORMAL LOW (ref 4.0–10.5)

## 2011-02-26 LAB — COMPREHENSIVE METABOLIC PANEL
ALT: 25 U/L (ref 0–35)
Alkaline Phosphatase: 88 U/L (ref 39–117)
CO2: 19 mEq/L (ref 19–32)
Chloride: 101 mEq/L (ref 96–112)
GFR calc Af Amer: >90 mL/min (ref >90)
GFR calc non Af Amer: >90 mL/min (ref >90)
Glucose, Bld: 286 mg/dL — ABNORMAL HIGH (ref 70–99)
Potassium: 3.4 mEq/L — ABNORMAL LOW (ref 3.5–5.1)
Sodium: 135 mEq/L (ref 135–145)

## 2011-02-26 LAB — DIC (DISSEMINATED INTRAVASCULAR COAGULATION)PANEL
Smear Review: NONE SEEN
aPTT: 31 seconds (ref 24–37)

## 2011-02-26 LAB — DIFFERENTIAL
Basophils Absolute: 0 10*3/uL (ref 0.0–0.1)
Eosinophils Absolute: 0 10*3/uL (ref 0.0–0.7)
Lymphocytes Relative: 8 % — ABNORMAL LOW (ref 12–46)
Monocytes Absolute: 0 10*3/uL — ABNORMAL LOW (ref 0.1–1.0)
Neutro Abs: 3.7 10*3/uL (ref 1.7–7.7)

## 2011-02-26 LAB — DIRECT ANTIGLOBULIN TEST (NOT AT ARMC)
DAT, IgG: NEGATIVE
DAT, complement: NEGATIVE

## 2011-02-26 LAB — PROTIME-INR: Prothrombin Time: 13 seconds (ref 11.6–15.2)

## 2011-02-26 LAB — C-REACTIVE PROTEIN: CRP: 0.05 mg/dL — ABNORMAL LOW (ref <0.60)

## 2011-02-26 MED ORDER — HYDROMORPHONE HCL PF 1 MG/ML IJ SOLN
1.0000 mg | INTRAMUSCULAR | Status: DC | PRN
  Administered 2011-02-26 – 2011-02-28 (×12): 2 mg via INTRAVENOUS
  Filled 2011-02-26 (×12): qty 2

## 2011-02-26 MED ORDER — PROMETHAZINE HCL 25 MG/ML IJ SOLN
12.5000 mg | Freq: Four times a day (QID) | INTRAMUSCULAR | Status: DC | PRN
  Administered 2011-02-26 – 2011-03-01 (×10): 12.5 mg via INTRAVENOUS
  Filled 2011-02-26 (×10): qty 1

## 2011-02-26 MED ORDER — PREDNISONE 20 MG PO TABS
40.0000 mg | ORAL_TABLET | Freq: Every day | ORAL | Status: DC
  Administered 2011-02-27: 40 mg via ORAL
  Filled 2011-02-26 (×2): qty 2

## 2011-02-26 MED ORDER — METHYLPREDNISOLONE SODIUM SUCC 125 MG IJ SOLR
60.0000 mg | Freq: Four times a day (QID) | INTRAMUSCULAR | Status: AC
  Administered 2011-02-26: 60 mg via INTRAVENOUS
  Filled 2011-02-26: qty 0.96

## 2011-02-26 NOTE — Progress Notes (Signed)
  IP PROGRESS NOTE  Subjective:   Patient feeling better. No bleeding. PICC line placed today  Objective:  Vital signs in last 24 hours: Temp:  [97.3 F (36.3 C)-98.1 F (36.7 C)] 97.3 F (36.3 C) (02/22 0537) Pulse Rate:  [75-88] 86  (02/22 0537) Resp:  [18-22] 18  (02/22 0537) BP: (130-162)/(80-111) 154/93 mmHg (02/22 0537) SpO2:  [98 %-100 %] 99 % (02/22 0745) Weight:  [203 lb 11.3 oz (92.4 kg)] 203 lb 11.3 oz (92.4 kg) (02/21 1825) Weight change:  Last BM Date: 02/26/11  Intake/Output from previous day: 02/21 0701 - 02/22 0700 In: 1260 [P.O.:360; I.V.:900] Out: -   Mouth: mucous membranes moist, pharynx normal without lesions Resp: clear to auscultation bilaterally Cardio: regular rate and rhythm, S1, S2 normal, no murmur, click, rub or gallop GI: soft, non-tender; bowel sounds normal; no masses,  no organomegaly Extremities: extremities normal, atraumatic, no cyanosis or edema  PICC-without erythema  Lab Results:  Basename 02/25/11 1050  WBC 3.6*  HGB 14.0  HCT 39.9  PLT 41*    BMET  Basename 02/25/11 1050  NA 135  K 4.1  CL 98  CO2 22  GLUCOSE 120*  BUN 11  CREATININE 0.76  CALCIUM 9.6    Studies/Results: Medications: I have reviewed the patient's current medications.  Assessment/Plan:  1. Thrombocytopenia: the likely etiology is reactive to acute illness (pancreatitis) in the setting of history of alcohol abuse. Coags are normal at this time. No active bleeding.  CBC is pending from today.  I would continue daily CBC and would transfuse only if bleeding.  2. Abdominal pain: likely due to pancreatitis. Improving.    LOS: 1 day   [facility] 02/26/2011, 3:31 PM

## 2011-02-26 NOTE — Progress Notes (Signed)
Phlebotomy unable to draw labs on 2/21. NP notified and orders obtained for foot stick. Phlebotomy still unable to draw labs. NP notifiedagain and orders obtained for arterial stick. After two people trying, still unable to collect all of the ordered labs. Lenny Pastel NP ordered a PICC line to be placed, which pt has had in the past. NP also asked that I re-schedule all ordered labs to be drawn when PICC line is placed. Will re-schedule for 8 am so that they may be drawn during the day.

## 2011-02-26 NOTE — Progress Notes (Signed)
   CARE MANAGEMENT NOTE 02/26/2011  Patient:  [name], [name]   Account Number:  [id number]  Date Initiated:  02/26/2011  Documentation initiated by:  Lanier Clam  Subjective/Objective Assessment:   ADMITTED W/N/V.HX: PEPTIC ULCER DZ.     Action/Plan:   FROM HOME W/SPOUSE,HAS PCP,PHARMACY,CANE, RW,TRANSP.   Anticipated DC Date:  03/03/2011   Anticipated DC Plan:  HOME/SELF CARE         Choice offered to / List presented to:             Status of service:  In process, will continue to follow Medicare Important Message given?   (If response is "NO", the following Medicare IM given date fields will be blank) Date Medicare IM given:   Date Additional Medicare IM given:    Discharge Disposition:    Per UR Regulation:  Reviewed for med. necessity/level of care/duration of stay  Comments:  02/26/11 Anis Degidio RN,BSN NCM [phone] PATIENT IS ON 02,IF 02 NEEDED FOR HOME CAN ARRANGE.

## 2011-02-26 NOTE — Procedures (Signed)
Placement of left basilic PICC.  Tip in lower SVC.  Length = 40 cm.  No immediate complication.

## 2011-02-26 NOTE — Progress Notes (Signed)
Subjective: Patient complaining of abdominal pain.  But feels slightly better today.  Objective: Vital signs in last 24 hours: Filed Vitals:   02/25/11 2021 02/25/11 2056 02/26/11 0537 02/26/11 0745  BP:  130/80 154/93   Pulse:  75 86   Temp:  98.1 F (36.7 C) 97.3 F (36.3 C)   TempSrc:  Oral Oral   Resp:  20 18   Height:      Weight:      SpO2: 98% 98% 100% 99%   Weight change:   Intake/Output Summary (Last 24 hours) at 02/26/11 1608 Last data filed at 02/26/11 0612  Gross per 24 hour  Intake   1260 ml  Output      0 ml  Net   1260 ml    Physical Exam: General: Awake, Oriented, No acute distress, very cachectic in appearance. HEENT: EOMI. Neck: Supple CV: S1 and S2 Lungs: Clear to ascultation bilaterally Abdomen: Soft, Nondistended, +bowel sounds, generalized tenderness to palpation with minimal guarding. Ext: Good pulses. Trace edema.  Lab Results:  [facility] 02/25/11 1050  NA 135  K 4.1  CL 98  CO2 22  GLUCOSE 120*  BUN 11  CREATININE 0.76  CALCIUM 9.6  MG --  PHOS --    Basename 02/25/11 1050  AST 51*  ALT 40*  ALKPHOS 115  BILITOT 0.4  PROT 7.6  ALBUMIN 4.4    Basename 02/25/11 1050  LIPASE 44  AMYLASE --    Basename 02/26/11 1510 02/25/11 1050  WBC 4.0 3.6*  NEUTROABS PENDING --  HGB 11.8* 14.0  HCT 35.2* 39.9  MCV 96.4 94.3  PLT 109*PENDING 41*   No results found for this basename: CKTOTAL:3,CKMB:3,CKMBINDEX:3,TROPONINI:3 in the last 72 hours No components found with this basename: POCBNP:3  Basename 02/26/11 1510  DDIMER 0.44   No results found for this basename: HGBA1C:2 in the last 72 hours No results found for this basename: CHOL:2,HDL:2,LDLCALC:2,TRIG:2,CHOLHDL:2,LDLDIRECT:2 in the last 72 hours No results found for this basename: TSH,T4TOTAL,FREET3,T3FREE,THYROIDAB in the last 72 hours  Basename 02/26/11 1510  VITAMINB12 --  FOLATE --  FERRITIN --  TIBC --  IRON --  RETICCTPCT 1.5    Micro Results: No results  found for this or any previous visit (from the past 240 hour(s)).  Studies/Results: Ct Abdomen Pelvis Wo Contrast  02/25/2011  *RADIOLOGY REPORT*  Clinical Data: Mid abdominal pain.  History of chronic pancreatitis.  CT ABDOMEN AND PELVIS WITHOUT CONTRAST  Technique:  Multidetector CT imaging of the abdomen and pelvis was performed following the standard protocol without intravenous contrast.  Comparison: CT of the abdomen 11/27/2010.  CT of abdomen and pelvis 06/15/2010.  Findings:  Lung Bases: Unremarkable.  Abdomen/Pelvis:  In the medial aspect of the lower pole of the right kidney there is a well-circumscribed 2.2 x 1.7 cm area of low attenuation (approximately 8 HU) with some thin peripheral calcification, that appears to represent a post radiofrequency ablation site.  No abnormal surrounding soft tissue to suggest local recurrence of disease.  No significant adjacent para-aortic lymphadenopathy.  The contralateral kidney is unremarkable in appearance.  No abnormal urinary tract calcifications or signs of hydroureteronephrosis.  The unenhanced appearance of the liver, gallbladder, pancreas, spleen and bilateral adrenal glands is unremarkable.  Extensive atherosclerosis of the abdominal and pelvic vasculature, without evidence of abdominal aortic aneurysm.  No ascites or pneumoperitoneum and no pathologic distension of bowel.  No definite pathologic adenopathy.  The patient is status post hysterectomy.  Ovaries are not confidently visualized  and may be atrophic or may have been surgically removed as well.  Urinary bladder is unremarkable in appearance.  Musculoskeletal: Status post ORIF and left proximal femur.  Bones are mildly osteopenic. There are no aggressive appearing lytic or blastic lesions noted in the visualized portions of the skeleton. A healing fracture of the posterior aspect of the left tenth rib is incidentally noted.  IMPRESSION:  1.  Expected postprocedural appearance of the lower pole left  kidney related to prior radiofrequency ablation, with continued decrease in size of post RFA region.  No definitive signs of local recurrence of disease. 2.  Atherosclerosis. 3.  Status post hysterectomy and ORIF of left proximal femur.  Original Report Authenticated By: Florencia Reasons, M.D.   US Abdomen Complete  02/26/2011  *RADIOLOGY REPORT*  Clinical Data:  Thrombocytopenia.  Question gallstones.  Cirrhosis. Recurrent left renal cell carcinoma with recent percutaneous ablation.  COMPLETE ABDOMINAL ULTRASOUND  Comparison:  CT 11/27/2010, 02/25/2011 and 03/10/2010  Findings:  Gallbladder:  No gallstones, gallbladder wall thickening, or pericholecystic fluid. Evaluation for a sonographic Murphy's sign is negative  Common bile duct:  Measures 11.4 mm in diameter.  This is enlarged but unchanged in comparison to prior CTs dating back to 03/10/2010  Liver:  No focal lesion identified.  Within normal limits in parenchymal echogenicity. No signs of intrahepatic ductal dilatation are identified.  The hepatic capsule appears smooth and no enlargement of the caudate lobe is seen.  IVC:  The proximal portion appears normal  Pancreas:  Is poorly visualized due to shadowing from overlying gas  Spleen:  Has a sagittal length of 6.8 cm.  A small calcification seen on recent CT in the region of the splenic hilum is identified. No other focal parenchymal abnormalities are suggested.  Right Kidney:  Has a sagittal length of 10.4 cm.  No focal parenchymal abnormalities or signs of hydronephrosis are seen  Left Kidney:  Demonstrates a sagittal length of 8.9 cm.  No signs of hydronephrosis are evident.  The post ablation area in the lower pole was better demonstrated on recent CT than on today's exam.  Abdominal aorta:  Demonstrates a maximal caliber of 2.1 cm with no aneurysmal dilatation evident.  Atheromatous change is seen with calcification diffusely.  IMPRESSION: No cholelithiasis or other focal gallbladder abnormality.   Stable common bile duct enlargement since 03/2011 with no associated intrahepatic ductal dilatation identified.  Poorly assessed pancreas today.  Left renal post ablation changes were better assessed with recent CTs.  Original Report Authenticated By: Bertha Stakes, M.D.   Ir Fluoro Guide Cv Line Left  02/26/2011  *RADIOLOGY REPORT*  Clinical Data: History of chronic pancreatitis with nausea and vomiting.  The patient needs IV access.  PICC LINE PLACEMENT WITH ULTRASOUND AND FLUOROSCOPIC  GUIDANCE  Fluoroscopy Time: 0.1 minutes.  The left arm was prepped with chlorhexidine, draped in the usual sterile fashion using maximum barrier technique (cap and mask, sterile gown, sterile gloves, large sterile sheet, hand hygiene and cutaneous antisepsis) and infiltrated locally with 1% Lidocaine.  Ultrasound demonstrated patency of the left basilic vein, and this was documented with an image.  Under real-time ultrasound guidance, this vein was accessed with a 21 gauge micropuncture needle and image documentation was performed.  The needle was exchanged over a guidewire for a peel-away sheath through which a 5 [place] double lumen PICC trimmed to 40 cm was advanced, positioned with its tip in the lower SVC.  Fluoroscopy during the procedure and fluoro spot  radiograph confirms appropriate catheter position.  The catheter was flushed, secured to the skin with Prolene sutures, and covered with a sterile dressing.  Complications:  None  IMPRESSION: Successful left arm PICC line placement with ultrasound and fluoroscopic guidance.  The catheter is ready for use.  Original Report Authenticated By: Richarda Overlie, M.D.   Ir US Guide Vasc Access Left  02/26/2011  *RADIOLOGY REPORT*  Clinical Data: History of chronic pancreatitis with nausea and vomiting.  The patient needs IV access.  PICC LINE PLACEMENT WITH ULTRASOUND AND FLUOROSCOPIC  GUIDANCE  Fluoroscopy Time: 0.1 minutes.  The left arm was prepped with chlorhexidine, draped  in the usual sterile fashion using maximum barrier technique (cap and mask, sterile gown, sterile gloves, large sterile sheet, hand hygiene and cutaneous antisepsis) and infiltrated locally with 1% Lidocaine.  Ultrasound demonstrated patency of the left basilic vein, and this was documented with an image.  Under real-time ultrasound guidance, this vein was accessed with a 21 gauge micropuncture needle and image documentation was performed.  The needle was exchanged over a guidewire for a peel-away sheath through which a 5 [place] double lumen PICC trimmed to 40 cm was advanced, positioned with its tip in the lower SVC.  Fluoroscopy during the procedure and fluoro spot radiograph confirms appropriate catheter position.  The catheter was flushed, secured to the skin with Prolene sutures, and covered with a sterile dressing.  Complications:  None  IMPRESSION: Successful left arm PICC line placement with ultrasound and fluoroscopic guidance.  The catheter is ready for use.  Original Report Authenticated By: Richarda Overlie, M.D.    Medications: I have reviewed the patient's current medications. Scheduled Meds:   . albuterol  2.5 mg Nebulization Q6H  . fluticasone  2 puff Inhalation BID  . ipratropium  0.5 mg Nebulization Q6H  . methylPREDNISolone (SOLU-MEDROL) injection  60 mg Intravenous Q6H  . nicotine  21 mg Transdermal Daily  . pantoprazole (PROTONIX) IV  40 mg Intravenous Q12H  . sodium chloride  1,000 mL Intravenous Once  . sodium chloride  3 mL Intravenous Q12H  . DISCONTD: albuterol  2.5 mg Nebulization Q6H  . DISCONTD: ipratropium  0.5 mg Nebulization Q6H  . DISCONTD: methylPREDNISolone (SOLU-MEDROL) injection  125 mg Intravenous Once   Continuous Infusions:   . sodium chloride 75 mL/hr at 02/26/11 1139   PRN Meds:.acetaminophen, acetaminophen, albuterol, alum & mag hydroxide-simeth, hydrALAZINE, HYDROcodone-acetaminophen, HYDROmorphone, LORazepam, ondansetron (ZOFRAN) IV,  ondansetron  Assessment/Plan: Abdominal pain with nausea and vomiting, likely due to acute on chronic pancreatitis. Patient had a CT of the abdomen and pelvis and ultrasound with results as indicated above.  No etiology on imaging to explain abdominal pain.  Patient is on anti-emetics and pain control.  Continue IV fluids.  Currently on clear liquid diet we'll advance as tolerated.  Thrombocytopenia Etiology unclear.  HIV, hepatitis panel, ESR pending.  CRP normal.  Platelet count improved from yesterday.  Oncology input appreciated.  Is thought to be due to pancreatitis with alcohol use.  DIC panel unremarkable so far.  COPD with presumed exacerbation and ongoing tobacco abuse Patient was counseled on smoking cessation.  Continue neb treatments.  Transition steroids to oral.  Anxiety disorder When necessary Ativan.  GERD Continue PPI.  Hypertension Uncontrolled likely due to pain.  Will start the patient on low dose amlodipine for better control.  Prophylaxis No heparin due to thrombocytopenia.   Disposition Pending.   LOS: 1 day  Janiel Derhammer A, MD 02/26/2011, 4:08 PM

## 2011-02-27 DIAGNOSIS — E876 Hypokalemia: Secondary | ICD-10-CM | POA: Diagnosis present

## 2011-02-27 LAB — CBC
MCH: 32.1 pg (ref 26.0–34.0)
MCHC: 32.7 g/dL (ref 30.0–36.0)
MCV: 98.3 fL (ref 78.0–100.0)
Platelets: 112 10*3/uL — ABNORMAL LOW (ref 150–400)

## 2011-02-27 LAB — BASIC METABOLIC PANEL
Calcium: 8.8 mg/dL (ref 8.4–10.5)
Creatinine, Ser: 0.65 mg/dL (ref 0.50–1.10)
GFR calc non Af Amer: >90 mL/min (ref >90)
Glucose, Bld: 267 mg/dL — ABNORMAL HIGH (ref 70–99)
Sodium: 136 mEq/L (ref 135–145)

## 2011-02-27 LAB — GLUCOSE, CAPILLARY: Glucose-Capillary: 192 mg/dL — ABNORMAL HIGH (ref 70–99)

## 2011-02-27 MED ORDER — POTASSIUM CHLORIDE CRYS ER 20 MEQ PO TBCR
20.0000 meq | EXTENDED_RELEASE_TABLET | Freq: Once | ORAL | Status: AC
  Administered 2011-02-27: 20 meq via ORAL
  Filled 2011-02-27: qty 1

## 2011-02-27 NOTE — Progress Notes (Signed)
Hematology Follow up:  Platelet count almost recovered. Now over 100 K. Her thrombocytopenia  was likely reactive due to acute illness and now has resolved. No further input at this time.   Please call with questions.

## 2011-02-27 NOTE — Progress Notes (Signed)
Subjective: Patient having abdominal pain but feels improved.  Tolerating some diet at this time.  Objective: Vital signs in last 24 hours: Filed Vitals:   02/27/11 0620 02/27/11 1015 02/27/11 1438 02/27/11 1500  BP: 130/81  132/81   Pulse: 105  90   Temp: 97.4 F (36.3 C)  98.6 F (37 C)   TempSrc: Oral  Oral   Resp: 18  18   Height:      Weight:      SpO2: 100% 98% 93% 98%   Weight change:   Intake/Output Summary (Last 24 hours) at 02/27/11 1533 Last data filed at 02/27/11 1300  Gross per 24 hour  Intake   1920 ml  Output    700 ml  Net   1220 ml    Physical Exam: General: Awake, Oriented, No acute distress, very cachectic in appearance. HEENT: EOMI. Neck: Supple CV: S1 and S2 Lungs: Clear to ascultation bilaterally Abdomen: Soft, Nondistended, +bowel sounds Ext: Good pulses. Trace edema.  Lab Results:  [facility] 02/27/11 0625 02/26/11 1510  NA 136 135  K 3.4* 3.4*  CL 104 101  CO2 18* 19  GLUCOSE 267* 286*  BUN 7 7  CREATININE 0.65 0.65  CALCIUM 8.8 8.8  MG -- --  PHOS -- --    Basename 02/26/11 1510 02/25/11 1050  AST 23 51*  ALT 25 40*  ALKPHOS 88 115  BILITOT 0.2* 0.4  PROT 6.5 7.6  ALBUMIN 3.3* 4.4    Basename 02/25/11 1050  LIPASE 44  AMYLASE --    Basename 02/27/11 0625 02/26/11 1510  WBC 11.3* 4.0  NEUTROABS -- 3.7  HGB 11.3* 11.8*  HCT 34.6* 35.2*  MCV 98.3 96.4  PLT 112* 109*109*   No results found for this basename: CKTOTAL:3,CKMB:3,CKMBINDEX:3,TROPONINI:3 in the last 72 hours No components found with this basename: POCBNP:3  Basename 02/26/11 1510  DDIMER 0.44   No results found for this basename: HGBA1C:2 in the last 72 hours No results found for this basename: CHOL:2,HDL:2,LDLCALC:2,TRIG:2,CHOLHDL:2,LDLDIRECT:2 in the last 72 hours No results found for this basename: TSH,T4TOTAL,FREET3,T3FREE,THYROIDAB in the last 72 hours  Basename 02/26/11 1510  VITAMINB12 --  FOLATE --  FERRITIN --  TIBC --  IRON --    RETICCTPCT 1.5    Micro Results: No results found for this or any previous visit (from the past 240 hour(s)).  Studies/Results: US Abdomen Complete  02/26/2011  *RADIOLOGY REPORT*  Clinical Data:  Thrombocytopenia.  Question gallstones.  Cirrhosis. Recurrent left renal cell carcinoma with recent percutaneous ablation.  COMPLETE ABDOMINAL ULTRASOUND  Comparison:  CT 11/27/2010, 02/25/2011 and 03/10/2010  Findings:  Gallbladder:  No gallstones, gallbladder wall thickening, or pericholecystic fluid. Evaluation for a sonographic Murphy's sign is negative  Common bile duct:  Measures 11.4 mm in diameter.  This is enlarged but unchanged in comparison to prior CTs dating back to 03/10/2010  Liver:  No focal lesion identified.  Within normal limits in parenchymal echogenicity. No signs of intrahepatic ductal dilatation are identified.  The hepatic capsule appears smooth and no enlargement of the caudate lobe is seen.  IVC:  The proximal portion appears normal  Pancreas:  Is poorly visualized due to shadowing from overlying gas  Spleen:  Has a sagittal length of 6.8 cm.  A small calcification seen on recent CT in the region of the splenic hilum is identified. No other focal parenchymal abnormalities are suggested.  Right Kidney:  Has a sagittal length of 10.4 cm.  No focal parenchymal abnormalities or  signs of hydronephrosis are seen  Left Kidney:  Demonstrates a sagittal length of 8.9 cm.  No signs of hydronephrosis are evident.  The post ablation area in the lower pole was better demonstrated on recent CT than on today's exam.  Abdominal aorta:  Demonstrates a maximal caliber of 2.1 cm with no aneurysmal dilatation evident.  Atheromatous change is seen with calcification diffusely.  IMPRESSION: No cholelithiasis or other focal gallbladder abnormality.  Stable common bile duct enlargement since 03/2011 with no associated intrahepatic ductal dilatation identified.  Poorly assessed pancreas today.  Left renal post  ablation changes were better assessed with recent CTs.  Original Report Authenticated By: Bertha Stakes, M.D.   Ir Fluoro Guide Cv Line Left  02/26/2011  *RADIOLOGY REPORT*  Clinical Data: History of chronic pancreatitis with nausea and vomiting.  The patient needs IV access.  PICC LINE PLACEMENT WITH ULTRASOUND AND FLUOROSCOPIC  GUIDANCE  Fluoroscopy Time: 0.1 minutes.  The left arm was prepped with chlorhexidine, draped in the usual sterile fashion using maximum barrier technique (cap and mask, sterile gown, sterile gloves, large sterile sheet, hand hygiene and cutaneous antisepsis) and infiltrated locally with 1% Lidocaine.  Ultrasound demonstrated patency of the left basilic vein, and this was documented with an image.  Under real-time ultrasound guidance, this vein was accessed with a 21 gauge micropuncture needle and image documentation was performed.  The needle was exchanged over a guidewire for a peel-away sheath through which a 5 [place] double lumen PICC trimmed to 40 cm was advanced, positioned with its tip in the lower SVC.  Fluoroscopy during the procedure and fluoro spot radiograph confirms appropriate catheter position.  The catheter was flushed, secured to the skin with Prolene sutures, and covered with a sterile dressing.  Complications:  None  IMPRESSION: Successful left arm PICC line placement with ultrasound and fluoroscopic guidance.  The catheter is ready for use.  Original Report Authenticated By: Richarda Overlie, M.D.   Ir US Guide Vasc Access Left  02/26/2011  *RADIOLOGY REPORT*  Clinical Data: History of chronic pancreatitis with nausea and vomiting.  The patient needs IV access.  PICC LINE PLACEMENT WITH ULTRASOUND AND FLUOROSCOPIC  GUIDANCE  Fluoroscopy Time: 0.1 minutes.  The left arm was prepped with chlorhexidine, draped in the usual sterile fashion using maximum barrier technique (cap and mask, sterile gown, sterile gloves, large sterile sheet, hand hygiene and cutaneous  antisepsis) and infiltrated locally with 1% Lidocaine.  Ultrasound demonstrated patency of the left basilic vein, and this was documented with an image.  Under real-time ultrasound guidance, this vein was accessed with a 21 gauge micropuncture needle and image documentation was performed.  The needle was exchanged over a guidewire for a peel-away sheath through which a 5 [place] double lumen PICC trimmed to 40 cm was advanced, positioned with its tip in the lower SVC.  Fluoroscopy during the procedure and fluoro spot radiograph confirms appropriate catheter position.  The catheter was flushed, secured to the skin with Prolene sutures, and covered with a sterile dressing.  Complications:  None  IMPRESSION: Successful left arm PICC line placement with ultrasound and fluoroscopic guidance.  The catheter is ready for use.  Original Report Authenticated By: Richarda Overlie, M.D.    Medications: I have reviewed the patient's current medications. Scheduled Meds:    . albuterol  2.5 mg Nebulization Q6H  . fluticasone  2 puff Inhalation BID  . ipratropium  0.5 mg Nebulization Q6H  . methylPREDNISolone (SOLU-MEDROL) injection  60 mg Intravenous  Q6H  . nicotine  21 mg Transdermal Daily  . pantoprazole (PROTONIX) IV  40 mg Intravenous Q12H  . potassium chloride  20 mEq Oral Once  . predniSONE  40 mg Oral Q breakfast  . sodium chloride  1,000 mL Intravenous Once  . sodium chloride  3 mL Intravenous Q12H  . DISCONTD: methylPREDNISolone (SOLU-MEDROL) injection  60 mg Intravenous Q6H   Continuous Infusions:    . sodium chloride 75 mL/hr at 02/27/11 1131   PRN Meds:.acetaminophen, acetaminophen, albuterol, alum & mag hydroxide-simeth, hydrALAZINE, HYDROcodone-acetaminophen, HYDROmorphone, LORazepam, ondansetron (ZOFRAN) IV, ondansetron, promethazine, DISCONTD: HYDROmorphone  Assessment/Plan: Abdominal pain with nausea and vomiting, likely due to acute on chronic pancreatitis. Patient had a CT of the abdomen and  pelvis and ultrasound with results as indicated above.  No etiology on imaging to explain abdominal pain.  Patient is on anti-emetics and pain control.  Continue IV fluids.  Pain improved.  Advance diet as tolerated.    Thrombocytopenia Improved.  Etiology unclear.  HIV nonreactive. hepatitis pane pending.  ESR normal.  CRP normal.  Hematology input appreciated, signed off.  Thought to be due to pancreatitis with alcohol use.  DIC panel unremarkable.  COPD with presumed exacerbation and ongoing tobacco abuse Patient was counseled on smoking cessation.  Continue neb treatments.  Taper steroids as tolerated.  Anxiety disorder When necessary Ativan.  GERD Continue PPI.  Hypertension Uncontrolled likely due to pain.  Better controlled now.  Does not need to be started on amlodipine (was not started yesterday as intended).  On when necessary hydralazine.  Hypokalemia Replace as needed.  Prophylaxis No heparin due to thrombocytopenia.   Disposition Pending.  Consider discharge in 1-2 days.   LOS: 2 days  Demba Nigh A, MD 02/27/2011, 3:33 PM

## 2011-02-28 LAB — BASIC METABOLIC PANEL
Calcium: 9.1 mg/dL (ref 8.4–10.5)
Creatinine, Ser: 0.74 mg/dL (ref 0.50–1.10)
GFR calc non Af Amer: >90 mL/min (ref >90)
Glucose, Bld: 97 mg/dL (ref 70–99)
Sodium: 140 mEq/L (ref 135–145)

## 2011-02-28 LAB — CBC
Hemoglobin: 11.7 g/dL — ABNORMAL LOW (ref 12.0–15.0)
MCH: 32.6 pg (ref 26.0–34.0)
MCHC: 33 g/dL (ref 30.0–36.0)
MCV: 98.9 fL (ref 78.0–100.0)

## 2011-02-28 LAB — URINE CULTURE
Colony Count: 15000
Culture  Setup Time: 201302230117

## 2011-02-28 MED ORDER — SODIUM CHLORIDE 0.9 % IJ SOLN
10.0000 mL | INTRAMUSCULAR | Status: DC | PRN
  Administered 2011-02-28: 10 mL

## 2011-02-28 MED ORDER — PANTOPRAZOLE SODIUM 40 MG PO TBEC
40.0000 mg | DELAYED_RELEASE_TABLET | Freq: Two times a day (BID) | ORAL | Status: DC
  Administered 2011-02-28 – 2011-03-01 (×2): 40 mg via ORAL
  Filled 2011-02-28 (×5): qty 1

## 2011-02-28 MED ORDER — HYDROMORPHONE HCL PF 1 MG/ML IJ SOLN
1.0000 mg | INTRAMUSCULAR | Status: DC | PRN
  Administered 2011-02-28: 1.5 mg via INTRAVENOUS
  Administered 2011-02-28 (×2): 1 mg via INTRAVENOUS
  Administered 2011-02-28: 1.5 mg via INTRAVENOUS
  Administered 2011-03-01: 06:00:00 via INTRAVENOUS
  Administered 2011-03-01 (×3): 1.5 mg via INTRAVENOUS
  Filled 2011-02-28: qty 1
  Filled 2011-02-28 (×4): qty 2
  Filled 2011-02-28: qty 1
  Filled 2011-02-28 (×2): qty 2

## 2011-02-28 MED ORDER — POTASSIUM CHLORIDE CRYS ER 20 MEQ PO TBCR
40.0000 meq | EXTENDED_RELEASE_TABLET | Freq: Once | ORAL | Status: DC
  Filled 2011-02-28: qty 2

## 2011-02-28 MED ORDER — HYDROMORPHONE HCL 4 MG PO TABS
4.0000 mg | ORAL_TABLET | Freq: Four times a day (QID) | ORAL | Status: DC | PRN
  Administered 2011-02-28 – 2011-03-01 (×3): 4 mg via ORAL
  Filled 2011-02-28 (×3): qty 1

## 2011-02-28 MED ORDER — PREDNISONE 20 MG PO TABS
20.0000 mg | ORAL_TABLET | Freq: Every day | ORAL | Status: DC
  Administered 2011-03-01: 20 mg via ORAL
  Filled 2011-02-28 (×2): qty 1

## 2011-02-28 MED ORDER — SODIUM CHLORIDE 0.9 % IJ SOLN: 10.0000 mL | Freq: Two times a day (BID) | INTRAMUSCULAR | Status: DC

## 2011-02-28 NOTE — Progress Notes (Signed)
Subjective: Patient still having abdominal pain.  Vomited this morning after having her breakfast.  Objective: Vital signs in last 24 hours: Filed Vitals:   02/27/11 2232 02/28/11 0304 02/28/11 0600 02/28/11 1432  BP:   166/96 175/113  Pulse:   99 88  Temp:   97.9 F (36.6 C) 98 F (36.7 C)  TempSrc:   Oral Oral  Resp:   20 22  Height:      Weight:      SpO2: 99% 99% 99% 100%   Weight change:   Intake/Output Summary (Last 24 hours) at 02/28/11 1500 Last data filed at 02/28/11 1433  Gross per 24 hour  Intake   2240 ml  Output   6084 ml  Net  -3844 ml    Physical Exam: General: Awake, Oriented, No acute distress, very cachectic in appearance. HEENT: EOMI. Neck: Supple CV: S1 and S2 Lungs: Clear to ascultation bilaterally Abdomen: Soft, Nondistended, +bowel sounds Ext: Good pulses. Trace edema.  Lab Results:  Basename 02/28/11 0608 02/27/11 2335 02/27/11 0625  NA 140 -- 136  K 3.4* -- 3.4*  CL 106 -- 104  CO2 26 -- 18*  GLUCOSE 97 -- 267*  BUN 9 -- 7  CREATININE 0.74 -- 0.65  CALCIUM 9.1 -- 8.8  MG -- 1.8 --  PHOS -- -- --    Basename 02/26/11 1510  AST 23  ALT 25  ALKPHOS 88  BILITOT 0.2*  PROT 6.5  ALBUMIN 3.3*   No results found for this basename: LIPASE:2,AMYLASE:2 in the last 72 hours  Basename 02/28/11 0608 02/27/11 0625 02/26/11 1510  WBC 12.4* 11.3* --  NEUTROABS -- -- 3.7  HGB 11.7* 11.3* --  HCT 35.5* 34.6* --  MCV 98.9 98.3 --  PLT 105* 112* --   No results found for this basename: CKTOTAL:3,CKMB:3,CKMBINDEX:3,TROPONINI:3 in the last 72 hours No components found with this basename: POCBNP:3  Basename 02/26/11 1510  DDIMER 0.44   No results found for this basename: HGBA1C:2 in the last 72 hours No results found for this basename: CHOL:2,HDL:2,LDLCALC:2,TRIG:2,CHOLHDL:2,LDLDIRECT:2 in the last 72 hours No results found for this basename: TSH,T4TOTAL,FREET3,T3FREE,THYROIDAB in the last 72 hours  Basename 02/26/11 1510  VITAMINB12  --  FOLATE --  FERRITIN --  TIBC --  IRON --  RETICCTPCT 1.5    Micro Results: Recent Results (from the past 240 hour(s))  URINE CULTURE     Status: Normal   Collection Time   02/26/11  2:22 PM      Component Value Range Status Comment   Specimen Description URINE, CLEAN CATCH   Final    Special Requests NONE   Final    Culture  Setup Time [id number]   Final    Colony Count 15,000 COLONIES/ML   Final    Culture     Final    Value: Multiple bacterial morphotypes present, none predominant. Suggest appropriate recollection if clinically indicated.   Report Status 02/28/2011 FINAL   Final     Studies/Results: No results found.  Medications: I have reviewed the patient's current medications. Scheduled Meds:    . albuterol  2.5 mg Nebulization Q6H  . fluticasone  2 puff Inhalation BID  . ipratropium  0.5 mg Nebulization Q6H  . nicotine  21 mg Transdermal Daily  . pantoprazole  40 mg Oral BID  . potassium chloride  20 mEq Oral Once  . predniSONE  40 mg Oral Q breakfast  . sodium chloride  1,000 mL Intravenous Once  . sodium  chloride  3 mL Intravenous Q12H  . DISCONTD: pantoprazole (PROTONIX) IV  40 mg Intravenous Q12H   Continuous Infusions:    . sodium chloride 20 mL/hr at 02/27/11 1532   PRN Meds:.acetaminophen, acetaminophen, albuterol, alum & mag hydroxide-simeth, hydrALAZINE, HYDROmorphone, HYDROmorphone, LORazepam, ondansetron (ZOFRAN) IV, ondansetron, promethazine, DISCONTD: HYDROcodone-acetaminophen, DISCONTD: HYDROmorphone  Assessment/Plan: Abdominal pain with nausea and vomiting, likely due to acute on chronic pancreatitis. Patient had a CT of the abdomen and pelvis and ultrasound which showed no clear etiology on imaging to explain abdominal pain.  Continue anti-emetics and pain control.  Continue IV fluids.    Thrombocytopenia Improved.  Etiology unclear.  HIV nonreactive. hepatitis pane pending.  ESR normal.  CRP normal.  Hematology input appreciated,  signed off.  Thought to be due to pancreatitis with alcohol use.  DIC panel unremarkable.  COPD with presumed exacerbation and ongoing tobacco abuse Patient was counseled on smoking cessation.  Continue neb treatments.  Taper steroids as tolerated.  Anxiety disorder When necessary Ativan.  GERD Continue PPI.  Hypertension Uncontrolled likely due to pain.  Continue to monitor.  On when necessary hydralazine.  Hypokalemia Replace as needed.  Will check a magnesium level in the morning.  Prophylaxis No heparin due to thrombocytopenia.   Disposition Pending.    LOS: 3 days  Annsleigh Dragoo A, MD 02/28/2011, 3:00 PM

## 2011-02-28 NOTE — Progress Notes (Signed)
02-27-11  thru 02-28-11 NSG Monitor clerk reports around  Midnight that pt who is normally in SR had 4 beats of V-tach at 2239 then some bigeminy then 6 beats of v tach then some more bigeminy back to SR then 8 beats of V-tach.  This the first documented episodes of ectopy noted.  Pt VSS, and has been asymptomatic, just has been continuing to c/o of her "normal" chronic epigastric pain, states "the pain in no different than it has been"  " and my nausea is the same as usual",  No SOB noted.  Last K+ was 3.4 and she has not had a magnesium.  Lenny Pastel NP on call and notified. Acknowledges infor and orders a stat Magnesium which comes back wnl.  Pt has remained in SR rate in the 80's and 90's.  Has labs ordered for the morning.  Will continue to monitor.

## 2011-02-28 NOTE — Progress Notes (Signed)
The patient is receiving Protonix by the intravenous route.  Based on criteria approved by the Pharmacy and Therapeutics Committee and the Medical Executive Committee, the medication is being converted to the equivalent oral dose form.  These criteria include: -No Active GI bleeding -Able to tolerate diet of full liquids (or better) or tube feeding -Able to tolerate other medications by the oral or enteral route  If you have any questions about this conversion, please contact the Pharmacy Department (ext [phone]).  Thank you.  Darrol Angel, PharmD 02/28/2011 2:17 PM

## 2011-03-01 LAB — BASIC METABOLIC PANEL
Chloride: 99 mEq/L (ref 96–112)
GFR calc Af Amer: >90 mL/min (ref >90)
Potassium: 3.2 mEq/L — ABNORMAL LOW (ref 3.5–5.1)

## 2011-03-01 LAB — HEPATITIS PANEL, ACUTE
HCV Ab: NEGATIVE
Hep A IgM: NEGATIVE
Hep B C IgM: NEGATIVE
Hepatitis B Surface Ag: NEGATIVE

## 2011-03-01 LAB — MAGNESIUM: Magnesium: 1.6 mg/dL (ref 1.5–2.5)

## 2011-03-01 MED ORDER — UNABLE TO FIND: Status: DC

## 2011-03-01 MED ORDER — HYDROMORPHONE HCL 4 MG PO TABS: 4.0000 mg | ORAL_TABLET | Freq: Four times a day (QID) | ORAL | Status: DC | PRN

## 2011-03-01 MED ORDER — FLUTICASONE PROPIONATE HFA 110 MCG/ACT IN AERO: 2.0000 | INHALATION_SPRAY | Freq: Two times a day (BID) | RESPIRATORY_TRACT | Status: DC

## 2011-03-01 MED ORDER — POTASSIUM CHLORIDE CRYS ER 20 MEQ PO TBCR: 20.0000 meq | EXTENDED_RELEASE_TABLET | Freq: Every day | ORAL | Status: DC

## 2011-03-01 MED ORDER — NICOTINE 21 MG/24HR TD PT24: 1.0000 | MEDICATED_PATCH | Freq: Every day | TRANSDERMAL | Status: AC

## 2011-03-01 MED ORDER — ALBUTEROL SULFATE (5 MG/ML) 0.5% IN NEBU
2.5000 mg | INHALATION_SOLUTION | Freq: Three times a day (TID) | RESPIRATORY_TRACT | Status: DC
  Administered 2011-03-01: 2.5 mg via RESPIRATORY_TRACT
  Filled 2011-03-01: qty 0.5

## 2011-03-01 MED ORDER — POTASSIUM CHLORIDE CRYS ER 20 MEQ PO TBCR
40.0000 meq | EXTENDED_RELEASE_TABLET | Freq: Once | ORAL | Status: AC
  Administered 2011-03-01: 40 meq via ORAL
  Filled 2011-03-01: qty 2

## 2011-03-01 MED ORDER — IPRATROPIUM BROMIDE 0.02 % IN SOLN
0.5000 mg | RESPIRATORY_TRACT | Status: DC | PRN
Start: 2011-03-01 — End: 2011-03-01

## 2011-03-01 MED ORDER — IPRATROPIUM BROMIDE 0.02 % IN SOLN
0.5000 mg | Freq: Three times a day (TID) | RESPIRATORY_TRACT | Status: DC
  Administered 2011-03-01: 0.5 mg via RESPIRATORY_TRACT
  Filled 2011-03-01: qty 2.5

## 2011-03-01 MED ORDER — ONDANSETRON HCL 4 MG PO TABS: 4.0000 mg | ORAL_TABLET | Freq: Three times a day (TID) | ORAL | Status: DC | PRN

## 2011-03-01 MED ORDER — ALBUTEROL SULFATE (5 MG/ML) 0.5% IN NEBU: 2.5000 mg | INHALATION_SOLUTION | RESPIRATORY_TRACT | Status: DC | PRN

## 2011-03-01 MED ORDER — PREDNISONE 10 MG PO TABS: ORAL_TABLET | ORAL | Status: DC

## 2011-03-01 NOTE — Progress Notes (Signed)
   CARE MANAGEMENT NOTE 03/01/2011  Patient:  [name], [name]   Account Number:  [id number]  Date Initiated:  02/26/2011  Documentation initiated by:  Lanier Clam  Subjective/Objective Assessment:   ADMITTED W/N/V.HX: PEPTIC ULCER DZ.     Action/Plan:   FROM HOME W/SPOUSE,HAS PCP,PHARMACY,CANE, RW,TRANSP.   Anticipated DC Date:  03/03/2011   Anticipated DC Plan:  HOME/SELF CARE         Choice offered to / List presented to:             Status of service:  Completed, signed off Medicare Important Message given?   (If response is "NO", the following Medicare IM given date fields will be blank) Date Medicare IM given:   Date Additional Medicare IM given:    Discharge Disposition:  HOME/SELF CARE  Per UR Regulation:  Reviewed for med. necessity/level of care/duration of stay  Comments:  02/26/11 Ezekial Arns RN,BSN NCM [phone] PATIENT IS ON 02,IF 02 NEEDED FOR HOME CAN ARRANGE.

## 2011-03-01 NOTE — Discharge Summary (Signed)
Discharge Summary  [name] MR#: [id number]  DOB:16-Sep-1950  Date of Admission: 02/25/2011 Date of Discharge: 03/01/2011  Patient's PCP: Billee Cashing, MD, MD  Attending Physician:Jolinda Pinkstaff A  Consults: None.  Discharge Diagnoses: Principal Problem:  *Abdominal pain, acute Active Problems:  ANXIETY DISORDER  GERD  CIRRHOSIS  PANCREATITIS, CHRONIC  Nausea and vomiting  Thrombocytopenia  COPD with acute exacerbation  HTN (hypertension), malignant  Hypokalemia  Brief Admitting History and Physical 61 year old Caucasian female with history of chronic pancreatitis, COPD with continued tobacco use, history of kidney cancer, peptic ulcer disease who presented on 02/25/2011 with abdominal pain.  Discharge Medications Medication List  As of 03/01/2011  2:06 PM   TAKE these medications         albuterol 108 (90 BASE) MCG/ACT inhaler   Commonly known as: PROVENTIL HFA;VENTOLIN HFA   Inhale 2 puffs into the lungs every 4 (four) hours as needed. For shortness of breath.      fluticasone 110 MCG/ACT inhaler   Commonly known as: FLOVENT HFA   Inhale 2 puffs into the lungs 2 (two) times daily.      HYDROmorphone 4 MG tablet   Commonly known as: DILAUDID   Take 1 tablet (4 mg total) by mouth every 6 (six) hours as needed. For pain.      LORazepam 1 MG tablet   Commonly known as: ATIVAN   Take 1 mg by mouth every 6 (six) hours as needed. For anxiety.      nicotine 21 mg/24hr patch   Commonly known as: NICODERM CQ - dosed in mg/24 hours   Place 1 patch onto the skin daily. Do not smoke while on the patch.      ondansetron 4 MG tablet   Commonly known as: ZOFRAN   Take 1 tablet (4 mg total) by mouth every 8 (eight) hours as needed for nausea. For nausea.      potassium chloride SA 20 MEQ tablet   Commonly known as: K-DUR,KLOR-CON   Take 1 tablet (20 mEq total) by mouth daily. For five days only.      predniSONE 10 MG tablet   Commonly known as: DELTASONE   20  mg daily for 3 days, 10 mg daily for 3 days, 5 mg daily for 3 days then discontinue      UNABLE TO FIND   Adult dependent diapers.            Hospital Course: Abdominal pain with nausea and vomiting, likely due to acute on chronic pancreatitis. Patient had a CT of the abdomen and pelvis and ultrasound as indicated below which showed no clear etiology on imaging to explain abdominal pain.  Patient improved with with bowel rest, anti-emetics and pain control.  Patient was started on clear liquid diet and advance as tolerated.  Prior to discharge patient reported that her abdominal pain was at baseline and was tolerating solid food prior to discharge.  Discussed with the patient the possibility of outpatient GI consultation for further evaluation, perhaps consider celiac plexus block to help with chronic pain.  Will defer to patient's primary care physician.    Thrombocytopenia Improved.  Etiology unclear.  HIV nonreactive. hepatitis pane pending.  ESR normal.  CRP normal.  DIC panel unremarkable.  Hematology, Dr. Clelia Croft with hematology was consulted, given improvement in platelet count hematology signed off.  Thought to be due to pancreatitis with alcohol use.    COPD with presumed exacerbation and ongoing tobacco abuse Patient was counseled on smoking  cessation.  Continued neb treatments.  Taper steroids as tolerated.  Patient was started on Flovent inhaler during the course of hospital stay.  Anxiety disorder When necessary Ativan.  GERD Continue PPI.  Hypertension Uncontrolled likely due to pain, improved with pain control.  Did not need to be started on any antihypertensive medications.  Was placed on when necessary hydralazine during the course of the hospital stay.    Hypokalemia Replace as needed.  Will give the patient potassium for 5 days after discharge.  Magnesium level normal.  Day of Discharge BP 129/86  Pulse 85  Temp(Src) 98.8 F (37.1 C) (Oral)  Resp 20  Ht 5\' 4"   (1.626 m)  Wt 92.4 kg (203 lb 11.3 oz)  BMI 34.97 kg/m2  SpO2 99%  Results for orders placed during the hospital encounter of 02/25/11 (from the past 48 hour(s))  MAGNESIUM     Status: Normal   Collection Time   02/27/11 11:35 PM      Component Value Range Comment   Magnesium 1.8  1.5 - 2.5 (mg/dL)   CBC     Status: Abnormal   Collection Time   02/28/11  6:08 AM      Component Value Range Comment   WBC 12.4 (*) 4.0 - 10.5 (K/uL)    RBC 3.59 (*) 3.87 - 5.11 (MIL/uL)    Hemoglobin 11.7 (*) 12.0 - 15.0 (g/dL)    HCT 16.1 (*) 09.6 - 46.0 (%)    MCV 98.9  78.0 - 100.0 (fL)    MCH 32.6  26.0 - 34.0 (pg)    MCHC 33.0  30.0 - 36.0 (g/dL)    RDW 04.5  40.9 - 81.1 (%)    Platelets 105 (*) 150 - 400 (K/uL) CONSISTENT WITH PREVIOUS RESULT  BASIC METABOLIC PANEL     Status: Abnormal   Collection Time   02/28/11  6:08 AM      Component Value Range Comment   Sodium 140  135 - 145 (mEq/L)    Potassium 3.4 (*) 3.5 - 5.1 (mEq/L)    Chloride 106  96 - 112 (mEq/L)    CO2 26  19 - 32 (mEq/L)    Glucose, Bld 97  70 - 99 (mg/dL)    BUN 9  6 - 23 (mg/dL)    Creatinine, Ser 9.14  0.50 - 1.10 (mg/dL)    Calcium 9.1  8.4 - 10.5 (mg/dL)    GFR calc non Af Amer >90  >90 (mL/min)    GFR calc Af Amer >90  >90 (mL/min)   BASIC METABOLIC PANEL     Status: Abnormal   Collection Time   03/01/11  6:45 AM      Component Value Range Comment   Sodium 136  135 - 145 (mEq/L)    Potassium 3.2 (*) 3.5 - 5.1 (mEq/L)    Chloride 99  96 - 112 (mEq/L)    CO2 31  19 - 32 (mEq/L)    Glucose, Bld 93  70 - 99 (mg/dL)    BUN 9  6 - 23 (mg/dL)    Creatinine, Ser 7.82  0.50 - 1.10 (mg/dL)    Calcium 8.8  8.4 - 10.5 (mg/dL)    GFR calc non Af Amer >90  >90 (mL/min)    GFR calc Af Amer >90  >90 (mL/min)   MAGNESIUM     Status: Normal   Collection Time   03/01/11  6:45 AM      Component Value Range Comment  Magnesium 1.6  1.5 - 2.5 (mg/dL)     Ct Abdomen Pelvis Wo Contrast  02/25/2011  *RADIOLOGY REPORT*  Clinical  Data: Mid abdominal pain.  History of chronic pancreatitis.  CT ABDOMEN AND PELVIS WITHOUT CONTRAST  Technique:  Multidetector CT imaging of the abdomen and pelvis was performed following the standard protocol without intravenous contrast.  Comparison: CT of the abdomen 11/27/2010.  CT of abdomen and pelvis 06/15/2010.  Findings:  Lung Bases: Unremarkable.  Abdomen/Pelvis:  In the medial aspect of the lower pole of the right kidney there is a well-circumscribed 2.2 x 1.7 cm area of low attenuation (approximately 8 HU) with some thin peripheral calcification, that appears to represent a post radiofrequency ablation site.  No abnormal surrounding soft tissue to suggest local recurrence of disease.  No significant adjacent para-aortic lymphadenopathy.  The contralateral kidney is unremarkable in appearance.  No abnormal urinary tract calcifications or signs of hydroureteronephrosis.  The unenhanced appearance of the liver, gallbladder, pancreas, spleen and bilateral adrenal glands is unremarkable.  Extensive atherosclerosis of the abdominal and pelvic vasculature, without evidence of abdominal aortic aneurysm.  No ascites or pneumoperitoneum and no pathologic distension of bowel.  No definite pathologic adenopathy.  The patient is status post hysterectomy.  Ovaries are not confidently visualized and may be atrophic or may have been surgically removed as well.  Urinary bladder is unremarkable in appearance.  Musculoskeletal: Status post ORIF and left proximal femur.  Bones are mildly osteopenic. There are no aggressive appearing lytic or blastic lesions noted in the visualized portions of the skeleton. A healing fracture of the posterior aspect of the left tenth rib is incidentally noted.  IMPRESSION:  1.  Expected postprocedural appearance of the lower pole left kidney related to prior radiofrequency ablation, with continued decrease in size of post RFA region.  No definitive signs of local recurrence of disease. 2.   Atherosclerosis. 3.  Status post hysterectomy and ORIF of left proximal femur.  Original Report Authenticated By: Florencia Reasons, M.D.   US Abdomen Complete  02/26/2011  *RADIOLOGY REPORT*  Clinical Data:  Thrombocytopenia.  Question gallstones.  Cirrhosis. Recurrent left renal cell carcinoma with recent percutaneous ablation.  COMPLETE ABDOMINAL ULTRASOUND  Comparison:  CT 11/27/2010, 02/25/2011 and 03/10/2010  Findings:  Gallbladder:  No gallstones, gallbladder wall thickening, or pericholecystic fluid. Evaluation for a sonographic Murphy's sign is negative  Common bile duct:  Measures 11.4 mm in diameter.  This is enlarged but unchanged in comparison to prior CTs dating back to 03/10/2010  Liver:  No focal lesion identified.  Within normal limits in parenchymal echogenicity. No signs of intrahepatic ductal dilatation are identified.  The hepatic capsule appears smooth and no enlargement of the caudate lobe is seen.  IVC:  The proximal portion appears normal  Pancreas:  Is poorly visualized due to shadowing from overlying gas  Spleen:  Has a sagittal length of 6.8 cm.  A small calcification seen on recent CT in the region of the splenic hilum is identified. No other focal parenchymal abnormalities are suggested.  Right Kidney:  Has a sagittal length of 10.4 cm.  No focal parenchymal abnormalities or signs of hydronephrosis are seen  Left Kidney:  Demonstrates a sagittal length of 8.9 cm.  No signs of hydronephrosis are evident.  The post ablation area in the lower pole was better demonstrated on recent CT than on today's exam.  Abdominal aorta:  Demonstrates a maximal caliber of 2.1 cm with no aneurysmal dilatation evident.  Atheromatous change is seen with calcification diffusely.  IMPRESSION: No cholelithiasis or other focal gallbladder abnormality.  Stable common bile duct enlargement since 03/2011 with no associated intrahepatic ductal dilatation identified.  Poorly assessed pancreas today.  Left renal  post ablation changes were better assessed with recent CTs.  Original Report Authenticated By: Bertha Stakes, M.D.   Ir Fluoro Guide Cv Line Left  02/26/2011  *RADIOLOGY REPORT*  Clinical Data: History of chronic pancreatitis with nausea and vomiting.  The patient needs IV access.  PICC LINE PLACEMENT WITH ULTRASOUND AND FLUOROSCOPIC  GUIDANCE  Fluoroscopy Time: 0.1 minutes.  The left arm was prepped with chlorhexidine, draped in the usual sterile fashion using maximum barrier technique (cap and mask, sterile gown, sterile gloves, large sterile sheet, hand hygiene and cutaneous antisepsis) and infiltrated locally with 1% Lidocaine.  Ultrasound demonstrated patency of the left basilic vein, and this was documented with an image.  Under real-time ultrasound guidance, this vein was accessed with a 21 gauge micropuncture needle and image documentation was performed.  The needle was exchanged over a guidewire for a peel-away sheath through which a 5 [place] double lumen PICC trimmed to 40 cm was advanced, positioned with its tip in the lower SVC.  Fluoroscopy during the procedure and fluoro spot radiograph confirms appropriate catheter position.  The catheter was flushed, secured to the skin with Prolene sutures, and covered with a sterile dressing.  Complications:  None  IMPRESSION: Successful left arm PICC line placement with ultrasound and fluoroscopic guidance.  The catheter is ready for use.  Original Report Authenticated By: Richarda Overlie, M.D.   Ir US Guide Vasc Access Left  02/26/2011  *RADIOLOGY REPORT*  Clinical Data: History of chronic pancreatitis with nausea and vomiting.  The patient needs IV access.  PICC LINE PLACEMENT WITH ULTRASOUND AND FLUOROSCOPIC  GUIDANCE  Fluoroscopy Time: 0.1 minutes.  The left arm was prepped with chlorhexidine, draped in the usual sterile fashion using maximum barrier technique (cap and mask, sterile gown, sterile gloves, large sterile sheet, hand hygiene and cutaneous  antisepsis) and infiltrated locally with 1% Lidocaine.  Ultrasound demonstrated patency of the left basilic vein, and this was documented with an image.  Under real-time ultrasound guidance, this vein was accessed with a 21 gauge micropuncture needle and image documentation was performed.  The needle was exchanged over a guidewire for a peel-away sheath through which a 5 [place] double lumen PICC trimmed to 40 cm was advanced, positioned with its tip in the lower SVC.  Fluoroscopy during the procedure and fluoro spot radiograph confirms appropriate catheter position.  The catheter was flushed, secured to the skin with Prolene sutures, and covered with a sterile dressing.  Complications:  None  IMPRESSION: Successful left arm PICC line placement with ultrasound and fluoroscopic guidance.  The catheter is ready for use.  Original Report Authenticated By: Richarda Overlie, M.D.    Disposition: Home  Diet: Low-fat diet.  Activity: Resume as tolerated   Follow-up Appts: Discharge Orders    Future Orders Please Complete By Expires   Increase activity slowly      Discharge instructions      Comments:   Followup with Billee Cashing, MD (PCP) as previously scheduled on 03/03/2011.  Please discuss with your primary care physician for consideration of GI referral.  Diet: Low fat diet.      TESTS THAT NEED FOLLOW-UP None  Time spent on discharge, talking to the patient, and coordinating care: 35 mins.   Signed: Andry Bogden A,  MD 03/01/2011, 2:06 PM

## 2011-03-01 NOTE — Progress Notes (Signed)
Discharged patient home.  Educated patient on smoking cessation. Patient stated she would "try my best to quit smoking." She also stated "I'm really glad he gave me a prescription for the patch."  Answered questions.  Follow up appointment in place.

## 2011-03-01 NOTE — Progress Notes (Signed)
Subjective: Still having abdominal pain, reports is close to baseline to her chronic pain.  Tolerating solid food without vomiting.  Objective: Vital signs in last 24 hours: Filed Vitals:   02/28/11 1950 02/28/11 2119 03/01/11 0524 03/01/11 0829  BP:  122/82 129/86   Pulse:  93 85   Temp:  97.7 F (36.5 C) 98.8 F (37.1 C)   TempSrc:  Oral Oral   Resp:  20 20   Height:      Weight:      SpO2: 97% 98% 99% 99%   Weight change:   Intake/Output Summary (Last 24 hours) at 03/01/11 1354 Last data filed at 03/01/11 0900  Gross per 24 hour  Intake   1290 ml  Output   3852 ml  Net  -2562 ml    Physical Exam: General: Awake, Oriented, No acute distress, very cachectic in appearance. HEENT: EOMI. Neck: Supple CV: S1 and S2 Lungs: Clear to ascultation bilaterally Abdomen: Soft, Nondistended, +bowel sounds Ext: Good pulses. Trace edema.  Lab Results:  Basename 03/01/11 0645 02/28/11 0608 02/27/11 2335  NA 136 140 --  K 3.2* 3.4* --  CL 99 106 --  CO2 31 26 --  GLUCOSE 93 97 --  BUN 9 9 --  CREATININE 0.76 0.74 --  CALCIUM 8.8 9.1 --  MG 1.6 -- 1.8  PHOS -- -- --    Basename 02/26/11 1510  AST 23  ALT 25  ALKPHOS 88  BILITOT 0.2*  PROT 6.5  ALBUMIN 3.3*   No results found for this basename: LIPASE:2,AMYLASE:2 in the last 72 hours  Basename 02/28/11 0608 02/27/11 0625 02/26/11 1510  WBC 12.4* 11.3* --  NEUTROABS -- -- 3.7  HGB 11.7* 11.3* --  HCT 35.5* 34.6* --  MCV 98.9 98.3 --  PLT 105* 112* --   No results found for this basename: CKTOTAL:3,CKMB:3,CKMBINDEX:3,TROPONINI:3 in the last 72 hours No components found with this basename: POCBNP:3  Basename 02/26/11 1510  DDIMER 0.44   No results found for this basename: HGBA1C:2 in the last 72 hours No results found for this basename: CHOL:2,HDL:2,LDLCALC:2,TRIG:2,CHOLHDL:2,LDLDIRECT:2 in the last 72 hours No results found for this basename: TSH,T4TOTAL,FREET3,T3FREE,THYROIDAB in the last 72  hours  Basename 02/26/11 1510  VITAMINB12 --  FOLATE --  FERRITIN --  TIBC --  IRON --  RETICCTPCT 1.5    Micro Results: Recent Results (from the past 240 hour(s))  URINE CULTURE     Status: Normal   Collection Time   02/26/11  2:22 PM      Component Value Range Status Comment   Specimen Description URINE, CLEAN CATCH   Final    Special Requests NONE   Final    Culture  Setup Time [id number]   Final    Colony Count 15,000 COLONIES/ML   Final    Culture     Final    Value: Multiple bacterial morphotypes present, none predominant. Suggest appropriate recollection if clinically indicated.   Report Status 02/28/2011 FINAL   Final     Studies/Results: No results found.  Medications: I have reviewed the patient's current medications. Scheduled Meds:    . albuterol  2.5 mg Nebulization TID  . fluticasone  2 puff Inhalation BID  . ipratropium  0.5 mg Nebulization TID  . nicotine  21 mg Transdermal Daily  . pantoprazole  40 mg Oral BID  . potassium chloride  40 mEq Oral Once  . potassium chloride  40 mEq Oral Once  . predniSONE  20 mg  Oral Q breakfast  . sodium chloride  1,000 mL Intravenous Once  . sodium chloride  10-40 mL Intracatheter Q12H  . sodium chloride  3 mL Intravenous Q12H  . DISCONTD: albuterol  2.5 mg Nebulization Q6H  . DISCONTD: ipratropium  0.5 mg Nebulization Q6H  . DISCONTD: pantoprazole (PROTONIX) IV  40 mg Intravenous Q12H  . DISCONTD: predniSONE  40 mg Oral Q breakfast   Continuous Infusions:    . sodium chloride 20 mL/hr at 02/27/11 1532   PRN Meds:.acetaminophen, acetaminophen, albuterol, alum & mag hydroxide-simeth, hydrALAZINE, HYDROmorphone, HYDROmorphone, ipratropium, LORazepam, ondansetron (ZOFRAN) IV, ondansetron, promethazine, sodium chloride, DISCONTD: albuterol  Assessment/Plan: Abdominal pain with nausea and vomiting, likely due to acute on chronic pancreatitis. Patient had a CT of the abdomen and pelvis and ultrasound which showed  no clear etiology on imaging to explain abdominal pain.  Improved with with bowel rest, anti-emetics and pain control.  Patient was started on clear liquid diet and advance as tolerated.  Prior to discharge patient reported that her abdominal pain was at baseline and was tolerating solid food prior to discharge.  Discussed with the patient the possibility of outpatient GI consultation for further evaluation, perhaps consider celiac plexus block to help with chronic pain.  Will defer to patient's primary care physician.    Thrombocytopenia Improved.  Etiology unclear.  HIV nonreactive. hepatitis pane pending.  ESR normal.  CRP normal.  Hematology input appreciated, signed off.  Thought to be due to pancreatitis with alcohol use.  DIC panel unremarkable.  COPD with presumed exacerbation and ongoing tobacco abuse Patient was counseled on smoking cessation.  Continue neb treatments.  Taper steroids as tolerated.  Anxiety disorder When necessary Ativan.  GERD Continue PPI.  Hypertension Uncontrolled likely due to pain.  Continue to monitor.  On when necessary hydralazine.  Hypokalemia Replace as needed.  Will give the patient potassium for 5 days after discharge.  Will check a magnesium level in the morning.  Prophylaxis No heparin due to thrombocytopenia.   Disposition Discharge patient today.   LOS: 4 days  Shammond Arave A, MD 03/01/2011, 1:54 PM

## 2011-03-02 ENCOUNTER — Emergency Department (HOSPITAL_COMMUNITY)
Admit: 2011-03-02 | Discharge: 2011-03-02 | Disposition: A | Discharge status: Home / Self Care | Payer: Medicaid Other | Attending: Emergency Medicine | Admitting: Emergency Medicine

## 2011-03-02 ENCOUNTER — Encounter (HOSPITAL_COMMUNITY): Payer: Self-pay | Admitting: *Deleted

## 2011-03-02 DIAGNOSIS — Z79899 Other long term (current) drug therapy: Secondary | ICD-10-CM | POA: Insufficient documentation

## 2011-03-02 DIAGNOSIS — F17200 Nicotine dependence, unspecified, uncomplicated: Secondary | ICD-10-CM | POA: Insufficient documentation

## 2011-03-02 DIAGNOSIS — T50901A Poisoning by unspecified drugs, medicaments and biological substances, accidental (unintentional), initial encounter: Secondary | ICD-10-CM

## 2011-03-02 DIAGNOSIS — J4489 Other specified chronic obstructive pulmonary disease: Secondary | ICD-10-CM | POA: Insufficient documentation

## 2011-03-02 DIAGNOSIS — Z85528 Personal history of other malignant neoplasm of kidney: Secondary | ICD-10-CM | POA: Insufficient documentation

## 2011-03-02 DIAGNOSIS — T40601A Poisoning by unspecified narcotics, accidental (unintentional), initial encounter: Secondary | ICD-10-CM | POA: Insufficient documentation

## 2011-03-02 DIAGNOSIS — J449 Chronic obstructive pulmonary disease, unspecified: Secondary | ICD-10-CM | POA: Insufficient documentation

## 2011-03-02 DIAGNOSIS — R10817 Generalized abdominal tenderness: Secondary | ICD-10-CM | POA: Insufficient documentation

## 2011-03-02 NOTE — ED Notes (Signed)
Husband [name] called and is talking to pt on the phone. Waiting for verbal agreement that rn can discuss pts medical treatment to husband.
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
## 2011-03-02 NOTE — ED Notes (Signed)
Unsure if pt discharge condition was charted. Pt was in good condition and ambulatory. Medications and follow up care discussed. Pt left with husband.

## 2011-03-02 NOTE — ED Provider Notes (Signed)
History     CSN: [id number]  Arrival date & time 03/02/11  1421   First MD Initiated Contact with Patient 03/02/11 [phone]      Chief Complaint  Patient presents with  . Drug Overdose    (Consider location/radiation/quality/duration/timing/severity/associated sxs/prior treatment) HPI Comments: Patient reports she was discharged from the hospital this morning for acute on chronic pancreatitis.  Patient was discharged home with dilaudid pills.  Patient has hx drug abuse.  Per husband's report to nurse, patient was given her dose by the patient and then patient was then seen by husband taking another dose.  Patient also admits to drinking one beer in addition to her pain medication.  Patient denies having taken her medication incorrectly and states she has taken three pills.  States she thinks her husband may have stolen the others.  Patient brought to ED when husband called the police.  Patient states she does not know why the police were called.  States she is not suicidal or homicidal and is not trying to harm herself.  Also states she feels safe at home and wants to be discharged home.    Patient is a 61 y.o. female presenting with Overdose. The history is provided by the patient.  Drug Overdose Pertinent negatives include no abdominal pain, chest pain, fever or vomiting.    Past Medical History  Diagnosis Date  . Pancreatitis   . COPD (chronic obstructive pulmonary disease)   . Substance abuse   . Cancer     renal ca    Past Surgical History  Procedure Date  . Partial gastrectomy     History reviewed. No pertinent family history.  History  Substance Use Topics  . Smoking status: Current Everyday Smoker -- 0.5 packs/day    Types: Cigarettes  . Smokeless tobacco: Never Used  . Alcohol Use: Yes     Hx of Alcohol abuse, reports she "still has an occasional beer", but drinks on her medications    OB History    Grav Para Term Preterm Abortions TAB SAB Ect Mult Living            Review of Systems  Constitutional: Negative for fever.  Respiratory: Negative for shortness of breath.   Cardiovascular: Negative for chest pain.  Gastrointestinal: Negative for vomiting and abdominal pain.  All other systems reviewed and are negative.    Allergies  Aspirin and Chlorpromazine hcl  Home Medications   Current Outpatient Rx  Name Route Sig Dispense Refill  . ALBUTEROL SULFATE HFA 108 (90 BASE) MCG/ACT IN AERS Inhalation Inhale 2 puffs into the lungs every 4 (four) hours as needed. For shortness of breath.     Marland Kitchen FLUTICASONE PROPIONATE  HFA 110 MCG/ACT IN AERO Inhalation Inhale 2 puffs into the lungs 2 (two) times daily. 1 Inhaler 0  . HYDROMORPHONE HCL 4 MG PO TABS Oral Take 1 tablet (4 mg total) by mouth every 6 (six) hours as needed. For pain. 20 tablet 0  . LORAZEPAM 1 MG PO TABS Oral Take 1 mg by mouth every 6 (six) hours as needed. For anxiety.    Marland Kitchen NICOTINE 21 MG/24HR TD PT24 Transdermal Place 1 patch onto the skin daily. Do not smoke while on the patch. 28 patch 0  . ONDANSETRON HCL 4 MG PO TABS Oral Take 1 tablet (4 mg total) by mouth every 8 (eight) hours as needed for nausea. For nausea. 20 tablet 0  . POTASSIUM CHLORIDE CRYS ER 20 MEQ PO TBCR Oral Take  1 tablet (20 mEq total) by mouth daily. For five days only. 5 tablet 0  . PREDNISONE 10 MG PO TABS  20 mg daily for 3 days, 10 mg daily for 3 days, 5 mg daily for 3 days then discontinue 11 tablet 0  . UNABLE TO FIND  Adult dependent diapers. 30 Units 1    BP 110/51  Pulse 85  Temp(Src) 98.7 F (37.1 C) (Oral)  Resp 19  SpO2 96%  Physical Exam  Nursing note and vitals reviewed. Constitutional: She is oriented to person, place, and time. She appears well-developed and well-nourished.  HENT:  Head: Normocephalic and atraumatic.  Neck: Neck supple.  Cardiovascular: Normal rate, regular rhythm and normal heart sounds.   Pulmonary/Chest: Breath sounds normal. No respiratory distress. She has no  wheezes. She has no rales. She exhibits no tenderness.  Abdominal: Soft. Bowel sounds are normal. She exhibits no distension and no mass. There is generalized tenderness. There is no rebound and no guarding.  Neurological: She is alert and oriented to person, place, and time.    ED Course  Procedures (including critical care time)  Labs Reviewed - No data to display No results found.   1. Drug overdose       MDM  Patient with hx drug abuse discharged home from hospital today with narcotics for acute on chronic pancreatitis.  I counted the pills and 6 dilaudid pills were missing.  Patient denies having taken all of these pills.  I have advised the patient that taking too many pills, and taking any pills in combination with alcohol can kill her.  I explained that she is to take one pill every 6 hours as needed for pain and she has taken too many. She verbalizes understanding.  Denies SI, HI.  States she feels safe at home.  Pt d/c home with PCP follow up.  Patient verbalizes understanding and agrees with plan.          Rise Patience, [place] 03/02/11 2030

## 2011-03-02 NOTE — ED Notes (Signed)
AVW:[id number]<XB> Expected date:03/02/11<BR> Expected time:<BR> Means of arrival:<BR> Comments:<BR> EMS 10 GC, pancraetitis

## 2011-03-02 NOTE — ED Notes (Signed)
physician assistant alerted to pts desire to leave [facility]. rn gave PA report on pt. Pa at bedside.

## 2011-03-02 NOTE — ED Notes (Signed)
Nursing student walked pt to discharge window and witnessed the pts husband throwing away pts discharge papers.

## 2011-03-02 NOTE — ED Notes (Signed)
Pt attempting to get phone in the room and was crawling in bed and losing her balance. Pt is put on high fall risk.

## 2011-03-02 NOTE — Discharge Instructions (Signed)
You have taken too many of your dilaudid pills too quickly.  Please take them only as directed.  You have also mixed these medications with alcohol, which is very dangerous.  Taking your medications this way and drinking alcohol with them can lead to permanent disability or even death.  You may return to the ER at any time for worsening condition or any new symptoms that concern you.   Overdose, Adult A person can overdose on alcohol, drugs or both by accident or on purpose. If it was on purpose, it is a serious matter. Professional help should be sought. If the overdose was an accident, certain steps should be taken to make sure that it never happens again. ACCIDENTAL OVERDOSE Overdosing on prescription medications can be a result of:  Not understanding the instructions.   Misreading the label.   Forgetting that you took a dose and then taking another by mistake. This situation happens a lot.  To make sure this does not happen again:  Clarify the correct dosage with your caregiver.   Place the correct dosage in a "pill-minder" container (labeled for each day and time of day).   Have someone dispense your medicine.  Please be sure to follow-up with your primary care doctor as directed. INTENTIONAL OVERDOSE If the overdose was on purpose, it is a serious situation. Taking more than the prescribed amount of medications (including taking someone else's prescription), abusing street drugs or drinking an amount of alcohol that requires medical treatment can show a variety of possible problems. These may indicate you:  Are depressed or suicidal.   Are abusing drugs, took too much or combined different drugs to experiment with the effects.   Mixed alcohol with drugs and did not realize the danger of doing so (this is drug abuse).   Are suffering addiction to drugs and/or alcohol (also known as chemical dependency).   Binge drink.  If you have not been referred to a mental health  professional for help, it is important that you get help right away. Only a professional can determine which problems may exist and what the best course of treatment may be. It is your responsibility to follow-up with further evaluation or treatment as directed.  Alcohol is responsible for a large number of overdoses and unintended deaths among college-age young adults. Binge drinking is consuming 4-5 drinks in a short period of time. The amount of alcohol in standard servings of wine (5 oz.), beer (12 oz.) and distilled spirits (1.5 oz., 80 proof) is the same. Beer or wine can be just as dangerous to the binge drinker as "hard" liquor can be.  CONSEQUENCES OF BINGE DRINKING Alcohol poisoning is the most serious consequence of binge drinking. This is a severe and potentially fatal physical reaction to an alcohol overdose. When too much alcohol is consumed, the brain does not get enough oxygen. The lack of oxygen will eventually cause the brain to shut down the voluntary functions that regulate breathing and heart rate. Symptoms of alcohol poisoning include:  Vomiting.   Passing out (unconsciousness).   Cold, clammy, pale or bluish skin.   Slow or irregular breathing.  WHAT SHOULD I DO NEXT? If you have a history of drug abuse or suffer chemical dependency (alcoholism, drug addiction or both), you might consider the following:  Talk with a qualified substance abuse counselor and consider entering a treatment program.   Go to a detox facility if necessary.   If you were attending self-help group meetings, consider  returning to them and go often.   Explore other resources located near you (see sources listed below).  If you are unsure if you have a substance abuse problem, ask yourself the following questions:  Have you been told by friends or family that drugs/alcohol has become a problem?   Do you get into fights when drinking or using drugs?   Do you have blackouts (not remembering what  you do while using)?   Do you lie about use or amounts of drugs or alcohol you consume?   Do you need chemicals to get you going?   Do you suffer in work or school performance because of drug or alcohol use?   Do you get sick from drug or alcohol use but continue to use anyway?   Do you need drugs or alcohol to relate to people or feel comfortable in social situations?   Do you use drugs or alcohol to forget problems?  If you answered "Yes" to any of the above questions, it means you show signs of chemical dependency and a professional evaluation is suggested. The longer the use of drugs and alcohol continues, the problems will become greater. SEEK IMMEDIATE MEDICAL CARE IF:   You feel like you might repeat your problematic behavior.   You need someone to talk to and feel that it should not wait.   You feel you are a danger to yourself or someone else.   You feel like you are having a new reaction to medications you are taking, or you are getting worse after leaving a care center.   You have an overwhelming urge to drink or use drugs.  Addiction cannot be cured, but it can be treated successfully. Treatment centers are listed in the yellow pages under: Cocaine, Narcotics, and Alcoholics Anonymous. Most hospitals and clinics can refer you to a specialized care center. The [place] government maintains a toll-free number for obtaining treatment referrals: 272 882 1907 or (470)779-5540 (TDD) and maintains a website: http://findtreatment.RockToxic.pl. Other websites for additional information are: www.mentalhealth.RockToxic.pl. and GreatestFeeling.tn. In [place] treatment resources are listed in each [place] with listings available under [employer] for [employer] or similar titles. Document Released: 12/24/2002 Document Revised: 09/02/2010 Document Reviewed: 11/15/2007 [facility] Patient Information 2012 [place], [place].

## 2011-03-02 NOTE — ED Notes (Signed)
[name] [phone]

## 2011-03-05 NOTE — ED Provider Notes (Signed)
Medical screening examination/treatment/procedure(s) were performed by non-physician practitioner and as supervising physician I was immediately available for consultation/collaboration. Brison Fiumara Y.   Gavin Pound. Oletta Lamas, MD 03/05/11 [phone]

## 2011-04-07 ENCOUNTER — Emergency Department (HOSPITAL_COMMUNITY)
Admission: EM | Admit: 2011-04-07 | Discharge: 2011-04-07 | Disposition: A | Discharge status: Home / Self Care | Payer: Medicaid Other | Attending: Emergency Medicine | Admitting: Emergency Medicine

## 2011-04-07 ENCOUNTER — Encounter (HOSPITAL_COMMUNITY): Payer: Self-pay | Admitting: *Deleted

## 2011-04-07 ENCOUNTER — Other Ambulatory Visit: Payer: Self-pay

## 2011-04-07 ENCOUNTER — Emergency Department (HOSPITAL_COMMUNITY): Payer: Medicaid Other

## 2011-04-07 DIAGNOSIS — R569 Unspecified convulsions: Secondary | ICD-10-CM | POA: Insufficient documentation

## 2011-04-07 DIAGNOSIS — R4182 Altered mental status, unspecified: Secondary | ICD-10-CM | POA: Insufficient documentation

## 2011-04-07 DIAGNOSIS — R51 Headache: Secondary | ICD-10-CM | POA: Insufficient documentation

## 2011-04-07 DIAGNOSIS — R259 Unspecified abnormal involuntary movements: Secondary | ICD-10-CM | POA: Insufficient documentation

## 2011-04-07 DIAGNOSIS — R5381 Other malaise: Secondary | ICD-10-CM | POA: Insufficient documentation

## 2011-04-07 DIAGNOSIS — R6884 Jaw pain: Secondary | ICD-10-CM | POA: Insufficient documentation

## 2011-04-07 DIAGNOSIS — J449 Chronic obstructive pulmonary disease, unspecified: Secondary | ICD-10-CM | POA: Insufficient documentation

## 2011-04-07 DIAGNOSIS — F19939 Other psychoactive substance use, unspecified with withdrawal, unspecified: Secondary | ICD-10-CM | POA: Insufficient documentation

## 2011-04-07 DIAGNOSIS — F13239 Sedative, hypnotic or anxiolytic dependence with withdrawal, unspecified: Secondary | ICD-10-CM

## 2011-04-07 DIAGNOSIS — J4489 Other specified chronic obstructive pulmonary disease: Secondary | ICD-10-CM | POA: Insufficient documentation

## 2011-04-07 DIAGNOSIS — R4789 Other speech disturbances: Secondary | ICD-10-CM | POA: Insufficient documentation

## 2011-04-07 DIAGNOSIS — F1520 Other stimulant dependence, uncomplicated: Secondary | ICD-10-CM | POA: Insufficient documentation

## 2011-04-07 DIAGNOSIS — M542 Cervicalgia: Secondary | ICD-10-CM | POA: Insufficient documentation

## 2011-04-07 HISTORY — DX: Unspecified convulsions: R56.9

## 2011-04-07 LAB — POCT I-STAT, CHEM 8
Calcium, Ion: 1.17 mmol/L (ref 1.12–1.32)
Chloride: 99 mEq/L (ref 96–112)
Glucose, Bld: 64 mg/dL — ABNORMAL LOW (ref 70–99)
HCT: 51 % — ABNORMAL HIGH (ref 36.0–46.0)
Hemoglobin: 17.3 g/dL — ABNORMAL HIGH (ref 12.0–15.0)
TCO2: 23 mmol/L (ref 0–100)

## 2011-04-07 LAB — RAPID URINE DRUG SCREEN, HOSP PERFORMED
Barbiturates: NOT DETECTED
Benzodiazepines: NOT DETECTED
Cocaine: NOT DETECTED
Tetrahydrocannabinol: NOT DETECTED

## 2011-04-07 LAB — URINALYSIS, ROUTINE W REFLEX MICROSCOPIC
Bilirubin Urine: NEGATIVE
Ketones, ur: NEGATIVE mg/dL
Nitrite: NEGATIVE
Specific Gravity, Urine: 1.007 (ref 1.005–1.030)
pH: 7 (ref 5.0–8.0)

## 2011-04-07 LAB — CBC
Hemoglobin: 16 g/dL — ABNORMAL HIGH (ref 12.0–15.0)
RBC: 4.82 MIL/uL (ref 3.87–5.11)

## 2011-04-07 LAB — ETHANOL: Alcohol, Ethyl (B): 53 mg/dL — ABNORMAL HIGH (ref 0–11)

## 2011-04-07 LAB — DIFFERENTIAL
Lymphs Abs: 1.6 10*3/uL (ref 0.7–4.0)
Monocytes Relative: 7 % (ref 3–12)
Neutro Abs: 7.4 10*3/uL (ref 1.7–7.7)
Neutrophils Relative %: 76 % (ref 43–77)

## 2011-04-07 LAB — URINE MICROSCOPIC-ADD ON

## 2011-04-07 MED ORDER — LORAZEPAM 1 MG PO TABS: 1.0000 mg | ORAL_TABLET | Freq: Four times a day (QID) | ORAL | Status: DC | PRN

## 2011-04-07 MED ORDER — HYDROMORPHONE HCL 2 MG PO TABS
4.0000 mg | ORAL_TABLET | Freq: Once | ORAL | Status: AC
  Administered 2011-04-07: 4 mg via ORAL
  Filled 2011-04-07: qty 2

## 2011-04-07 MED ORDER — LORAZEPAM 2 MG/ML IJ SOLN
1.0000 mg | Freq: Once | INTRAMUSCULAR | Status: DC
  Filled 2011-04-07: qty 1

## 2011-04-07 MED ORDER — LORAZEPAM 2 MG/ML IJ SOLN
1.0000 mg | Freq: Once | INTRAMUSCULAR | Status: AC
  Administered 2011-04-07: 1 mg via INTRAMUSCULAR

## 2011-04-07 MED ORDER — SODIUM CHLORIDE 0.9 % IV BOLUS (SEPSIS): 1000.0000 mL | Freq: Once | INTRAVENOUS | Status: DC

## 2011-04-07 NOTE — Discharge Instructions (Signed)
Drug Abuse and Addiction in Sports There are many types of drugs that one may become addicted to including illegal drugs (marijuana, cocaine, amphetamines, hallucinogens, and narcotics), prescription drugs (hydrocodone, codeine, and alprazolam), and other chemicals such as alcohol or nicotine. Two types of addiction exist: physical and emotional. Physical addiction usually occurs after prolonged use of a drug. However, some drugs may only take a couple uses before addiction can occur. Physical addiction is marked by withdrawal symptoms, in which the person experiences negative symptoms such as sweat, anxiety, tremors, hallucinations, or cravings in the absence of using the drug. Emotional dependence is the psychological desire for the "high" that the drugs produce when taken. SYMPTOMS   Inattentiveness.   Negligence.   Forgetfulness.   Insomnia.   Mood swings.  RISK INCREASES WITH:   Family history of addiction.   Personal history of addictive personality.  Studies have shown that risktakers, which many athletes are, have a higher risk of addiction. PREVENTION The only adequate prevention of drug abuse is abstinence from drugs. TREATMENT  The first step in quitting substance abuse is recognizing the problem and realizing that one has the power to change. Quitting requires a plan and support from others. It is often necessary to seek medical assistance. Caregivers are available to offer counseling, and for certain cases, medicine to diminish the physical symptoms of withdrawal. Many organizations exist such as Alcoholics Anonymous, Narcotics Anonymous, or the National Council on Alcoholism that offer support for individuals who have chosen to quit their habits. Document Released: 12/21/2004 Document Revised: 12/10/2010 Document Reviewed: 04/04/2008 [employer] Patient Information 2012 [employer]. 

## 2011-04-07 NOTE — ED Provider Notes (Signed)
History     CSN: [id number]  Arrival date & time 04/07/11  1227   First MD Initiated Contact with Patient 04/07/11 1259      Chief Complaint  Patient presents with  . Code Stroke    (Consider location/radiation/quality/duration/timing/severity/associated sxs/prior treatment) HPI Comments: Patient here intially called out as a code stroke, Dr. Thad Ranger has seen the patient and does not believe this to be stroke.  Husband called 911 for the patient because dystonic movements and inability to speak correctly and coherently - husband states that she was last seen normal at 0700, that when the home health nurse arrived the patient was complaining of headache and jaw pain - states that she took her 2-3 of her dilaudid today but that she has not had ativan.  The patient herself reports no ativan in 5 days - reports last drink of alcohol was 2 days ago and that she "sneaks" her husband's alcohol.  Patient with long history of polysubstance abuse who was recently here with unintentional dilaudid overdose.  Patient is a 61 y.o. female presenting with altered mental status. The history is provided by the patient, the spouse and the EMS personnel. No language interpreter was used.  Altered Mental Status This is a new problem. The current episode started today. The problem occurs constantly. The problem has been unchanged. Associated symptoms include headaches, neck pain and weakness. Pertinent negatives include no abdominal pain, anorexia, arthralgias, change in bowel habit, chest pain, chills, congestion, coughing, diaphoresis, fatigue, fever, joint swelling, myalgias, nausea, numbness, rash, sore throat, swollen glands, urinary symptoms, vertigo, visual change or vomiting. The symptoms are aggravated by nothing. She has tried nothing for the symptoms. The treatment provided no relief.    Past Medical History  Diagnosis Date  . Pancreatitis   . COPD (chronic obstructive pulmonary disease)   . Substance  abuse   . Cancer     renal ca  . Seizures     Past Surgical History  Procedure Date  . Partial gastrectomy     No family history on file.  History  Substance Use Topics  . Smoking status: Current Everyday Smoker -- 0.5 packs/day    Types: Cigarettes  . Smokeless tobacco: Never Used  . Alcohol Use: Yes     Hx of Alcohol abuse, reports she "still has an occasional beer", but drinks on her medications    OB History    Grav Para Term Preterm Abortions TAB SAB Ect Mult Living                  Review of Systems  Constitutional: Negative for fever, chills, diaphoresis and fatigue.  HENT: Positive for neck pain. Negative for congestion and sore throat.   Respiratory: Negative for cough.   Cardiovascular: Negative for chest pain.  Gastrointestinal: Negative for nausea, vomiting, abdominal pain, anorexia and change in bowel habit.  Musculoskeletal: Negative for myalgias, joint swelling and arthralgias.  Skin: Negative for rash.  Neurological: Positive for weakness and headaches. Negative for vertigo and numbness.  Psychiatric/Behavioral: Positive for altered mental status.  All other systems reviewed and are negative.    Allergies  Aspirin and Chlorpromazine hcl  Home Medications   Current Outpatient Rx  Name Route Sig Dispense Refill  . ALBUTEROL SULFATE HFA 108 (90 BASE) MCG/ACT IN AERS Inhalation Inhale 2 puffs into the lungs every 4 (four) hours as needed. For shortness of breath.     Marland Kitchen FLUTICASONE PROPIONATE  HFA 110 MCG/ACT IN AERO Inhalation Inhale  2 puffs into the lungs 2 (two) times daily. 1 Inhaler 0  . HYDROMORPHONE HCL 4 MG PO TABS Oral Take 1 tablet (4 mg total) by mouth every 6 (six) hours as needed. For pain. 20 tablet 0  . LORAZEPAM 1 MG PO TABS Oral Take 1 mg by mouth every 6 (six) hours as needed. For anxiety.    Marland Kitchen ONDANSETRON HCL 4 MG PO TABS Oral Take 1 tablet (4 mg total) by mouth every 8 (eight) hours as needed for nausea. For nausea. 20 tablet 0  .  POTASSIUM CHLORIDE CRYS ER 20 MEQ PO TBCR Oral Take 1 tablet (20 mEq total) by mouth daily. For five days only. 5 tablet 0  . PREDNISONE 10 MG PO TABS  20 mg daily for 3 days, 10 mg daily for 3 days, 5 mg daily for 3 days then discontinue 11 tablet 0  . UNABLE TO FIND  Adult dependent diapers. 30 Units 1    BP 173/102  Pulse 125  Temp(Src) 98.5 F (36.9 C) (Oral)  Resp 14  SpO2 100%  Physical Exam  Nursing note and vitals reviewed. Constitutional: She appears well-developed and well-nourished. No distress.  HENT:  Head: Normocephalic and atraumatic.  Right Ear: External ear normal.  Left Ear: External ear normal.  Nose: Nose normal.  Mouth/Throat: Oropharynx is clear and moist. No oropharyngeal exudate.  Eyes: Conjunctivae are normal. Pupils are equal, round, and reactive to light. No scleral icterus.  Neck: Normal range of motion. Neck supple.  Cardiovascular: Regular rhythm and normal heart sounds.  Exam reveals no gallop and no friction rub.   No murmur heard.      tacycardic  Pulmonary/Chest: Effort normal and breath sounds normal. No respiratory distress. She has no wheezes. She has no rales. She exhibits no tenderness.  Abdominal: Soft. Bowel sounds are normal. She exhibits no distension and no mass. There is no tenderness. There is no rebound and no guarding.  Musculoskeletal: Normal range of motion. She exhibits no edema and no tenderness.  Lymphadenopathy:    She has no cervical adenopathy.  Neurological: She is alert. No cranial nerve deficit. She exhibits normal muscle tone. Coordination normal.       Dystonic jerking movement.  Skin: Skin is warm and dry. No rash noted. No erythema. No pallor.    ED Course  Procedures (including critical care time)   Labs Reviewed  CBC  DIFFERENTIAL  URINALYSIS, ROUTINE W REFLEX MICROSCOPIC  URINE RAPID DRUG SCREEN (HOSP PERFORMED)   Ct Head Wo Contrast  04/07/2011  *RADIOLOGY REPORT*  Clinical Data: Code stroke.  Slurred  speech.  CT HEAD WITHOUT CONTRAST  Technique:  Contiguous axial images were obtained from the base of the skull through the vertex without contrast.  Comparison: CT head without contrast 06/13/2010.  Findings:  The study is mildly degraded by patient motion.  Mild generalized atrophy is stable.  No acute cortical infarct, hemorrhage, mass lesion is present.  The ventricles are proportionate to the degree of atrophy.  Minimal periventricular white matter hypoattenuation is similar to the prior exam.  The paranasal sinuses and mastoid air cells are clear.  The atherosclerotic calcifications are again noted within the cavernous carotid arteries bilaterally.  IMPRESSION:  1.  No acute intracranial abnormality or significant interval change. 2.  Mild generalized atrophy. 3.  The study is mildly degraded by patient motion throughout.  These results were called by telephone on 04/07/2011  at  12:54 p.m. to  Dr. Roseanne Reno, who  verbally acknowledged these results.  Original Report Authenticated By: Jamesetta Orleans. MATTERN, M.D.   Results for orders placed during the hospital encounter of 04/07/11  CBC      Component Value Range   WBC 9.8  4.0 - 10.5 (K/uL)   RBC 4.82  3.87 - 5.11 (MIL/uL)   Hemoglobin 16.0 (*) 12.0 - 15.0 (g/dL)   HCT 16.1  09.6 - 04.5 (%)   MCV 91.7  78.0 - 100.0 (fL)   MCH 33.2  26.0 - 34.0 (pg)   MCHC 36.2 (*) 30.0 - 36.0 (g/dL)   RDW 40.9  81.1 - 91.4 (%)   Platelets 225  150 - 400 (K/uL)  DIFFERENTIAL      Component Value Range   Neutrophils Relative 76  43 - 77 (%)   Neutro Abs 7.4  1.7 - 7.7 (K/uL)   Lymphocytes Relative 16  12 - 46 (%)   Lymphs Abs 1.6  0.7 - 4.0 (K/uL)   Monocytes Relative 7  3 - 12 (%)   Monocytes Absolute 0.7  0.1 - 1.0 (K/uL)   Eosinophils Relative 1  0 - 5 (%)   Eosinophils Absolute 0.1  0.0 - 0.7 (K/uL)   Basophils Relative 0  0 - 1 (%)   Basophils Absolute 0.0  0.0 - 0.1 (K/uL)  ETHANOL      Component Value Range   Alcohol, Ethyl (B) 53 (*) 0 - 11  (mg/dL)   Ct Head Wo Contrast  04/07/2011  *RADIOLOGY REPORT*  Clinical Data: Code stroke.  Slurred speech.  CT HEAD WITHOUT CONTRAST  Technique:  Contiguous axial images were obtained from the base of the skull through the vertex without contrast.  Comparison: CT head without contrast 06/13/2010.  Findings:  The study is mildly degraded by patient motion.  Mild generalized atrophy is stable.  No acute cortical infarct, hemorrhage, mass lesion is present.  The ventricles are proportionate to the degree of atrophy.  Minimal periventricular white matter hypoattenuation is similar to the prior exam.  The paranasal sinuses and mastoid air cells are clear.  The atherosclerotic calcifications are again noted within the cavernous carotid arteries bilaterally.  IMPRESSION:  1.  No acute intracranial abnormality or significant interval change. 2.  Mild generalized atrophy. 3.  The study is mildly degraded by patient motion throughout.  These results were called by telephone on 04/07/2011  at  12:54 p.m. to  Dr. Roseanne Reno, who verbally acknowledged these results.  Original Report Authenticated By: Jamesetta Orleans. MATTERN, M.D.     Date: 04/07/2011  Rate: 137  Rhythm: sinus tachycardia  QRS Axis: normal  Intervals: normal  ST/T Wave abnormalities: nonspecific ST/T changes  Conduction Disutrbances:nonspecific intraventricular conduction delay  Narrative Interpretation: Noted with tachycardia - reads afib - but p waves noted.  Reviewed by Dr. Anitra Lauth  Old EKG Reviewed: changes noted    Benzodiazepine withdrawal    MDM  Patient here initially with altered mental status and dystonia reaction and called a code stroke - after further evaluation patient with normal stroke screen and with likely benzo withdrawal - symptoms completely resolved after administration of ativan - patient is noted to be out and admits to having taken more of her medication than she was supposed to - she is asking for refill of this  medication, which I will do.        Izola Price [place], [place] 04/07/11 [phone]

## 2011-04-07 NOTE — ED Notes (Signed)
Notified by IV team unable to gain IV access, Dr. Anitra Lauth notified of inability to gain IV access

## 2011-04-07 NOTE — Code Documentation (Signed)
Code stroke called 1213, Pt arrived to [facility] ED 1227, stroke team arrived at 1227, Pt in CT scan at 1230, Lab arrived at 1227.  Pt woke up at 0700 and appeared to be fine, went back to bed and awoke at 1000 when home health nurse arrived and pt c/o severe facial pain and headache.  NIHSS 4, cancelled at 1253

## 2011-04-07 NOTE — ED Notes (Signed)
Pt brought in per [facility] for stoke like symptoms, per pts husband last seen normal was 07:00 today, when home health nurse arrived at home the pt was c/o HA & jaw pain, per EMS pts husband reported that the pt took 2-3 Dilaudid today of unknown dosage & that pt normally takes Ativan, but has not taken it in about 5 days

## 2011-04-07 NOTE — ED Notes (Signed)
Stoke encoded at [employer] today, Code Stroke called at 12:13, Pt arrived at ED @ 12:27, EDP exam completed @ 12:27, Stroke Team present @ 12:27, pblebotomist arrived @ 12:27, pt arrived in CT @ 12:30, Pt last seen normal @ 07:00 today, Code stroke was canceled @ 12:53 by Dr. Roseanne Reno, Neuro hospitalists

## 2011-04-07 NOTE — ED Notes (Signed)
Pt in room 6 on cardiac monitor, pt has Stroke Team RN completing NIH score with IV team RN at bedside, Roseanne Reno, MD neuro hospitalists at bedside

## 2011-04-10 NOTE — ED Provider Notes (Signed)
Medical screening examination/treatment/procedure(s) were conducted as a shared visit with non-physician practitioner(s) and myself.  I personally evaluated the patient during the encounter  Pt initially came in as code stroke but I cancelled due to no focal deficits and more likely benzo withdrawal.  dystonic jerking but no sz present  Gwyneth Sprout, MD 04/10/11 2046

## 2011-04-30 ENCOUNTER — Emergency Department (HOSPITAL_COMMUNITY)
Admission: EM | Admit: 2011-04-30 | Discharge: 2011-05-01 | Disposition: A | Discharge status: Home / Self Care | Payer: Medicaid Other | Attending: Emergency Medicine | Admitting: Emergency Medicine

## 2011-04-30 ENCOUNTER — Encounter (HOSPITAL_COMMUNITY): Payer: Self-pay | Admitting: Emergency Medicine

## 2011-04-30 DIAGNOSIS — K861 Other chronic pancreatitis: Secondary | ICD-10-CM

## 2011-04-30 DIAGNOSIS — F1010 Alcohol abuse, uncomplicated: Secondary | ICD-10-CM | POA: Insufficient documentation

## 2011-04-30 DIAGNOSIS — R109 Unspecified abdominal pain: Secondary | ICD-10-CM | POA: Insufficient documentation

## 2011-04-30 DIAGNOSIS — F10929 Alcohol use, unspecified with intoxication, unspecified: Secondary | ICD-10-CM

## 2011-04-30 DIAGNOSIS — J449 Chronic obstructive pulmonary disease, unspecified: Secondary | ICD-10-CM | POA: Insufficient documentation

## 2011-04-30 DIAGNOSIS — J4489 Other specified chronic obstructive pulmonary disease: Secondary | ICD-10-CM | POA: Insufficient documentation

## 2011-04-30 DIAGNOSIS — Z79899 Other long term (current) drug therapy: Secondary | ICD-10-CM | POA: Insufficient documentation

## 2011-04-30 LAB — URINALYSIS, ROUTINE W REFLEX MICROSCOPIC
Bilirubin Urine: NEGATIVE
Glucose, UA: NEGATIVE mg/dL
Ketones, ur: NEGATIVE mg/dL
Leukocytes, UA: NEGATIVE
Protein, ur: NEGATIVE mg/dL
pH: 6.5 (ref 5.0–8.0)

## 2011-04-30 LAB — CBC
HCT: 38.4 % (ref 36.0–46.0)
Hemoglobin: 13.3 g/dL (ref 12.0–15.0)
MCH: 31.4 pg (ref 26.0–34.0)
MCHC: 34.6 g/dL (ref 30.0–36.0)
MCV: 90.8 fL (ref 78.0–100.0)
RDW: 12.9 % (ref 11.5–15.5)

## 2011-04-30 LAB — COMPREHENSIVE METABOLIC PANEL
Albumin: 3.6 g/dL (ref 3.5–5.2)
BUN: 8 mg/dL (ref 6–23)
CO2: 21 mEq/L (ref 19–32)
Calcium: 8.9 mg/dL (ref 8.4–10.5)
Chloride: 96 mEq/L (ref 96–112)
Creatinine, Ser: 0.66 mg/dL (ref 0.50–1.10)
GFR calc non Af Amer: >90 mL/min (ref >90)
Total Bilirubin: 0.2 mg/dL — ABNORMAL LOW (ref 0.3–1.2)

## 2011-04-30 LAB — URINE MICROSCOPIC-ADD ON

## 2011-04-30 LAB — DIFFERENTIAL
Basophils Relative: 1 % (ref 0–1)
Eosinophils Relative: 1 % (ref 0–5)
Monocytes Absolute: 0.6 10*3/uL (ref 0.1–1.0)
Monocytes Relative: 10 % (ref 3–12)
Neutro Abs: 3.1 10*3/uL (ref 1.7–7.7)

## 2011-04-30 LAB — LIPASE, BLOOD: Lipase: 46 U/L (ref 11–59)

## 2011-04-30 MED ORDER — ONDANSETRON 8 MG PO TBDP: 8.0000 mg | ORAL_TABLET | Freq: Three times a day (TID) | ORAL | Status: AC | PRN

## 2011-04-30 MED ORDER — IPRATROPIUM BROMIDE 0.02 % IN SOLN
0.5000 mg | Freq: Once | RESPIRATORY_TRACT | Status: AC
  Administered 2011-04-30: 0.5 mg via RESPIRATORY_TRACT
  Filled 2011-04-30 (×2): qty 2.5

## 2011-04-30 MED ORDER — SODIUM CHLORIDE 0.9 % IV SOLN: 1000.0000 mL | INTRAVENOUS | Status: DC

## 2011-04-30 MED ORDER — HYDROMORPHONE HCL 4 MG PO TABS: 4.0000 mg | ORAL_TABLET | Freq: Four times a day (QID) | ORAL | Status: DC | PRN

## 2011-04-30 MED ORDER — ONDANSETRON HCL 4 MG/2ML IJ SOLN
4.0000 mg | Freq: Once | INTRAMUSCULAR | Status: AC
  Administered 2011-04-30: 4 mg via INTRAVENOUS
  Filled 2011-04-30: qty 2

## 2011-04-30 MED ORDER — LORAZEPAM 2 MG/ML IJ SOLN
1.0000 mg | Freq: Once | INTRAMUSCULAR | Status: AC
  Administered 2011-04-30: 1 mg via INTRAVENOUS
  Filled 2011-04-30: qty 1

## 2011-04-30 MED ORDER — SODIUM CHLORIDE 0.9 % IV SOLN
1000.0000 mL | Freq: Once | INTRAVENOUS | Status: AC
  Administered 2011-04-30: 1000 mL via INTRAVENOUS

## 2011-04-30 MED ORDER — HYDROMORPHONE HCL PF 1 MG/ML IJ SOLN
1.0000 mg | INTRAMUSCULAR | Status: AC | PRN
  Administered 2011-04-30 (×3): 1 mg via INTRAVENOUS
  Filled 2011-04-30 (×3): qty 1

## 2011-04-30 MED ORDER — POTASSIUM CHLORIDE CRYS ER 20 MEQ PO TBCR
40.0000 meq | EXTENDED_RELEASE_TABLET | Freq: Once | ORAL | Status: AC
  Administered 2011-04-30: 40 meq via ORAL
  Filled 2011-04-30: qty 2

## 2011-04-30 MED ORDER — SODIUM CHLORIDE 0.9 % IV SOLN: 1000.0000 mL | Freq: Once | INTRAVENOUS | Status: DC

## 2011-04-30 MED ORDER — ALBUTEROL SULFATE (5 MG/ML) 0.5% IN NEBU
5.0000 mg | INHALATION_SOLUTION | Freq: Once | RESPIRATORY_TRACT | Status: AC
  Administered 2011-04-30: 5 mg via RESPIRATORY_TRACT
  Filled 2011-04-30 (×2): qty 1

## 2011-04-30 NOTE — Discharge Instructions (Signed)
Alcohol Intoxication Alcohol intoxication means your blood alcohol level is above legal limits. Alcohol is a drug. It has serious side effects. These side effects can include:  Damage to your organs (liver, nervous system, and blood system).   Unclear thinking.   Slowed reflexes.   Decreased muscle coordination.  HOME CARE  Do not drink and drive.   Do not drink alcohol if you are taking medicine or using other drugs. Doing so can cause serious medical problems or even death.   Drink enough water and fluids to keep your pee (urine) clear or pale yellow.   Eat healthy foods.   Only take medicine as told by your doctor.   Join an alcohol support group.  GET HELP RIGHT AWAY IF:  You become shaky when you stop drinking.   Your thinking is unclear or you become confused.   You throw up (vomit) blood. It may look bright red or like coffee grounds.   You notice blood in your poop (bowel movements).   You become lightheaded or pass out (faint).  MAKE SURE YOU:   Understand these instructions.   Will watch your condition.   Will get help right away if you are not doing well or get worse.  Document Released: 06/09/2007 Document Revised: 12/10/2010 Document Reviewed: 06/09/2009 [facility] Patient Information 2012 [place], [place].Acute Pancreatitis The pancreas is a large gland located behind your stomach. It produces (secretes) enzymes. These enzymes help digest food. It also releases the hormones glucagon and insulin. These hormones help regulate blood sugar. When the pancreas becomes inflamed, the disease is called pancreatitis. Inflammation of the pancreas occurs when enzymes from the pancreas begin attacking and digesting the pancreas. CAUSES  Most cases ofsudden onset (acute) pancreatitis are caused by:  Alcohol abuse.   Gallstones.  Other less common causes are:  Some medications.   Exposure to certain chemicals   Infection.   Damage caused by an accident (trauma).     Surgery of the belly (abdomen).  SYMPTOMS  Acute pancreatitis usually begins with pain in the upper abdomen and may radiate to the back. This pain may last a couple days. The constant pain varies from mild to severe. The acute form of this disease may vary from mild, nonspecific abdominal pain to profound shock with coma. About 1 in 5 cases are severe. These patients become dehydrated and develop low blood pressure. In severe cases, bleeding into the pancreas can lead to shock and death. The lungs, heart, and kidneys may fail. DIAGNOSIS  Your caregiver will form a clinical opinion after giving you an exam. Laboratory work is used to confirm this diagnosis. Often,a digestive enzyme from the pancreas (serum amylase) and other enzymes are elevated. Sugars and fats (lipids) in the blood may be elevated. There may also be changes in the following levels: calcium, magnesium, potassium, chloride and bicarbonate (chemicals in the blood). X-rays, a CT scan, or ultrasound of your abdomen may be necessary to search for other causes of your abdominal pain. TREATMENT  Most pancreatitis requires treatment of symptoms. Most acute attacks last a couple of days. Your caregiver can discuss the treatment options with you.  If complications occur, hospitalization may be necessary for pain control and intravenous (IV) fluid replacement.   Sometimes, a tube may be put into the stomach to control vomiting.   Food may not be allowed for 3 to 4 days. This gives the pancreas time to rest. Giving the pancreas a rest means there is no stimulation that would  produce more enzymes and cause more damage.   Medicines (antibiotics) that kill germs may be given if infection is the cause.   Sometimes, surgery may be required.   Following an acute attack, your caregiver will determine the cause, if possible, and offer suggestions to prevent recurrences.  HOME CARE INSTRUCTIONS   Eat smaller, more frequent meals. This reduces  the amount of digestive juices the pancreas produces.   Decrease the amount of fat in your diet. This may help reduce loose, diarrheal stools.   Drink enough water and fluids to keep your urine clear or pale yellow. This is to avoid dehydration which can cause increased pain.   Talk to your caregiver about pain relievers or other medicines that may help.   Avoid anything that may have triggered your pancreatitis (for example, alcohol).   Follow the diet advised by your caregiver. Do not advance the diet too soon.   Take medicines as prescribed.   Get plenty of rest.   Check your blood sugar at home as directed by your caregiver.   If your caregiver has given you a follow-up appointment, it is very important to keep that appointment. Not keeping the appointment could result in a lasting (chronic) or permanent injury, pain, and disability. If there is any problem keeping the appointment, you must call to reschedule.  SEEK MEDICAL CARE IF:   You are not recovering in the time described by your caregiver.   You have persistent pain, weakness, or feel sick to your stomach (nauseous).   You have recovered and then have another bout of pain.  SEEK IMMEDIATE MEDICAL CARE IF:   You are unable to eat or keep fluids down.   Your pain increases a lot or changes.   You have an oral temperature above 102 F (38.9 C), not controlled by medicine.   Your skin or the white part of your eyes look yellow (jaundice).   You develop vomiting.   You feel dizzy or faint.   Your blood sugar is high (over 300).  MAKE SURE YOU:   Understand these instructions.   Will watch your condition.   Will get help right away if you are not doing well or get worse.  Document Released: 12/21/2004 Document Revised: 12/10/2010 Document Reviewed: 08/04/2007 [facility] Patient Information 2012 [place], [place].

## 2011-04-30 NOTE — ED Notes (Signed)
Went to check on pt. Pt states "I need my nausea medicine, my pain medicine, and my xanax. I've asked for it three times." Writer told pt she would make nurse aware in the event he did not know. Pt states "They should know I've asked three times."

## 2011-04-30 NOTE — ED Notes (Signed)
Per EMS and Pt: Pt has been drinking alcohol and tanking pain medications and reports she has abdominal pain with nausea. Is alert and oriented, emotional, and appears intoxicated. VSS.

## 2011-04-30 NOTE — ED Notes (Signed)
MD at bedside. 

## 2011-04-30 NOTE — ED Notes (Addendum)
PIV attempt x2, PIV team notified

## 2011-04-30 NOTE — ED Notes (Signed)
Bed:WA04<BR> Expected date:<BR> Expected time:<BR> Means of arrival:<BR> Comments:<BR> EMS

## 2011-04-30 NOTE — ED Notes (Signed)
Pt out in hallway verbally upset wanting pain medicine. Write redirected pt back to room. Pt loudly " I have been here for 3 hours. I just want my pain medicine." Writer explained to have spoken with charge nurse and nurse was trying to get her medicine. Pt went back in room and closed the door.

## 2011-04-30 NOTE — ED Notes (Signed)
Pt currently calm and cooperative.  

## 2011-04-30 NOTE — ED Notes (Signed)
Pt cursing and angry, Charge nurse at bedside to assist and administer meds IM per MD.

## 2011-04-30 NOTE — ED Notes (Signed)
Pt continues to be emotionally labile, cursing at staff, forgetful.

## 2011-04-30 NOTE — ED Notes (Signed)
PIV team at bedside. 

## 2011-04-30 NOTE — ED Notes (Signed)
MD notified of poor venous access, pt wandering halls, cooperative and steady ambulatory, but requires frequent redirection

## 2011-04-30 NOTE — ED Provider Notes (Signed)
History     CSN: [id number]  Arrival date & time 04/30/11  1836   First MD Initiated Contact with Patient 04/30/11 1900      Chief Complaint  Patient presents with  . Abdominal Pain    (Consider location/radiation/quality/duration/timing/severity/associated sxs/prior treatment) Patient is a 61 y.o. female presenting with abdominal pain. The history is provided by the patient.  Abdominal Pain The primary symptoms of the illness include abdominal pain, nausea and vomiting. The current episode started 6 to 12 hours ago. The onset of the illness was gradual. The problem has not changed since onset. The abdominal pain is located in the epigastric region. The severity of the abdominal pain is 10/10. The abdominal pain is relieved by nothing. The abdominal pain is exacerbated by alcohol use.  Vomiting occurs 2 to 5 times per day. The emesis contains stomach contents.  Associated medical issues comments: chronic pancreatitis.  Pt has not been able to eat well the last few days.  She thought that drinking a beer might help.  Pt has had multiple recurrent episodes of abdominal pain requiring ED evaluation.  Past Medical History  Diagnosis Date  . Pancreatitis   . COPD (chronic obstructive pulmonary disease)   . Substance abuse   . Cancer     renal ca  . Seizures     Past Surgical History  Procedure Date  . Partial gastrectomy     History reviewed. No pertinent family history.  History  Substance Use Topics  . Smoking status: Current Everyday Smoker -- 0.5 packs/day    Types: Cigarettes  . Smokeless tobacco: Never Used  . Alcohol Use: Yes     Hx of Alcohol abuse, reports she "still has an occasional beer", but drinks on her medications    OB History    Grav Para Term Preterm Abortions TAB SAB Ect Mult Living                  Review of Systems  Gastrointestinal: Positive for nausea, vomiting and abdominal pain.  All other systems reviewed and are  negative.    Allergies  Aspirin and Chlorpromazine hcl  Home Medications   Current Outpatient Rx  Name Route Sig Dispense Refill  . ALBUTEROL SULFATE HFA 108 (90 BASE) MCG/ACT IN AERS Inhalation Inhale 2 puffs into the lungs every 4 (four) hours as needed. For shortness of breath.    Marland Kitchen FLUTICASONE PROPIONATE  HFA 110 MCG/ACT IN AERO Inhalation Inhale 2 puffs into the lungs 2 (two) times daily. 1 Inhaler 0  . HYDROMORPHONE HCL 4 MG PO TABS Oral Take 1 tablet (4 mg total) by mouth every 6 (six) hours as needed. For pain. 20 tablet 0  . LORAZEPAM 1 MG PO TABS Oral Take 1 tablet (1 mg total) by mouth every 6 (six) hours as needed. For anxiety. 30 tablet 0  . ONDANSETRON HCL 4 MG PO TABS Oral Take 1 tablet (4 mg total) by mouth every 8 (eight) hours as needed for nausea. For nausea. 20 tablet 0    BP 128/99  Pulse 85  Temp(Src) 97.6 F (36.4 C) (Oral)  Resp 18  SpO2 99%  Physical Exam  Nursing note and vitals reviewed. Constitutional: She appears distressed.       thin  HENT:  Head: Normocephalic and atraumatic.  Right Ear: External ear normal.  Left Ear: External ear normal.       ETOH odor on breath  Eyes: Conjunctivae are normal. Right eye exhibits no discharge.  Left eye exhibits no discharge. No scleral icterus.  Neck: Neck supple. No tracheal deviation present.  Cardiovascular: Normal rate, regular rhythm and intact distal pulses.   Pulmonary/Chest: Effort normal and breath sounds normal. No stridor. No respiratory distress. She has no wheezes. She has no rales.  Abdominal: Soft. Bowel sounds are normal. She exhibits no distension, no fluid wave, no ascites and no mass. There is tenderness in the epigastric area. There is no rebound and no guarding. No hernia.  Musculoskeletal: She exhibits no edema and no tenderness.  Neurological: She is alert. She has normal strength. No sensory deficit. Cranial nerve deficit:  no gross defecits noted. She exhibits normal muscle tone. She  displays no seizure activity. Coordination normal.  Skin: Skin is warm and dry. No rash noted.  Psychiatric: She has a normal mood and affect.    ED Course  Procedures (including critical care time)  Medications  HYDROmorphone (DILAUDID) injection 1 mg (1 mg Intravenous Given 04/30/11 2155)  0.9 %  sodium chloride infusion (1000 mL Intravenous New Bag/Given 04/30/11 2116)    Followed by  0.9 %  sodium chloride infusion (not administered)    Followed by  0.9 %  sodium chloride infusion (not administered)  LORazepam (ATIVAN) injection 1 mg (not administered)  potassium chloride SA (K-DUR,KLOR-CON) CR tablet 40 mEq (not administered)  ondansetron (ZOFRAN) injection 4 mg (4 mg Intravenous Given 04/30/11 2044)  albuterol (PROVENTIL) (5 MG/ML) 0.5% nebulizer solution 5 mg (5 mg Nebulization Given 04/30/11 2156)  ipratropium (ATROVENT) nebulizer solution 0.5 mg (0.5 mg Nebulization Given 04/30/11 2157)  ondansetron (ZOFRAN) injection 4 mg (4 mg Intravenous Given 04/30/11 2204)    Labs Reviewed  ETHANOL - Abnormal; Notable for the following:    Alcohol, Ethyl (B) 239 (*)    All other components within normal limits  URINALYSIS, ROUTINE W REFLEX MICROSCOPIC - Abnormal; Notable for the following:    Hgb urine dipstick SMALL (*)    All other components within normal limits  COMPREHENSIVE METABOLIC PANEL - Abnormal; Notable for the following:    Sodium 132 (*)    Potassium 3.2 (*)    Total Bilirubin 0.2 (*)    All other components within normal limits  CBC  DIFFERENTIAL  LIPASE, BLOOD  URINE MICROSCOPIC-ADD ON   No results found.   1. PANCREATITIS, CHRONIC   2. Alcohol intoxication       MDM  The patient is feeling better now after treatment in the emergency department.  She appears to have a recurrence of her chronic pancreatitis associated with her recent alcohol consumption. The patient does not show any evidence of acute dehydration. Her vital signs are stable and she is  tolerating oral fluids. I counseled the patient on not drinking alcohol again in the future        Celene Kras, MD 04/30/11 [phone]

## 2011-04-30 NOTE — ED Notes (Signed)
Pt reports her pain is chronic and it is either an 8/10 or a 0/10. Pt resistant to explanations of care of health teaching.

## 2011-05-01 NOTE — ED Notes (Signed)
Pt taking taxi home to husband

## 2011-05-10 IMAGING — CR DG ABDOMEN ACUTE W/ 1V CHEST
3 series · 3 of 3 positions shown · non-contrast
Comparison: 06/03/2008

CLINICAL DATA: Abdominal pain.

ACUTE ABDOMEN SERIES (ABDOMEN 2 VIEW & CHEST 1 VIEW)

[view not recorded (1 of 3)]
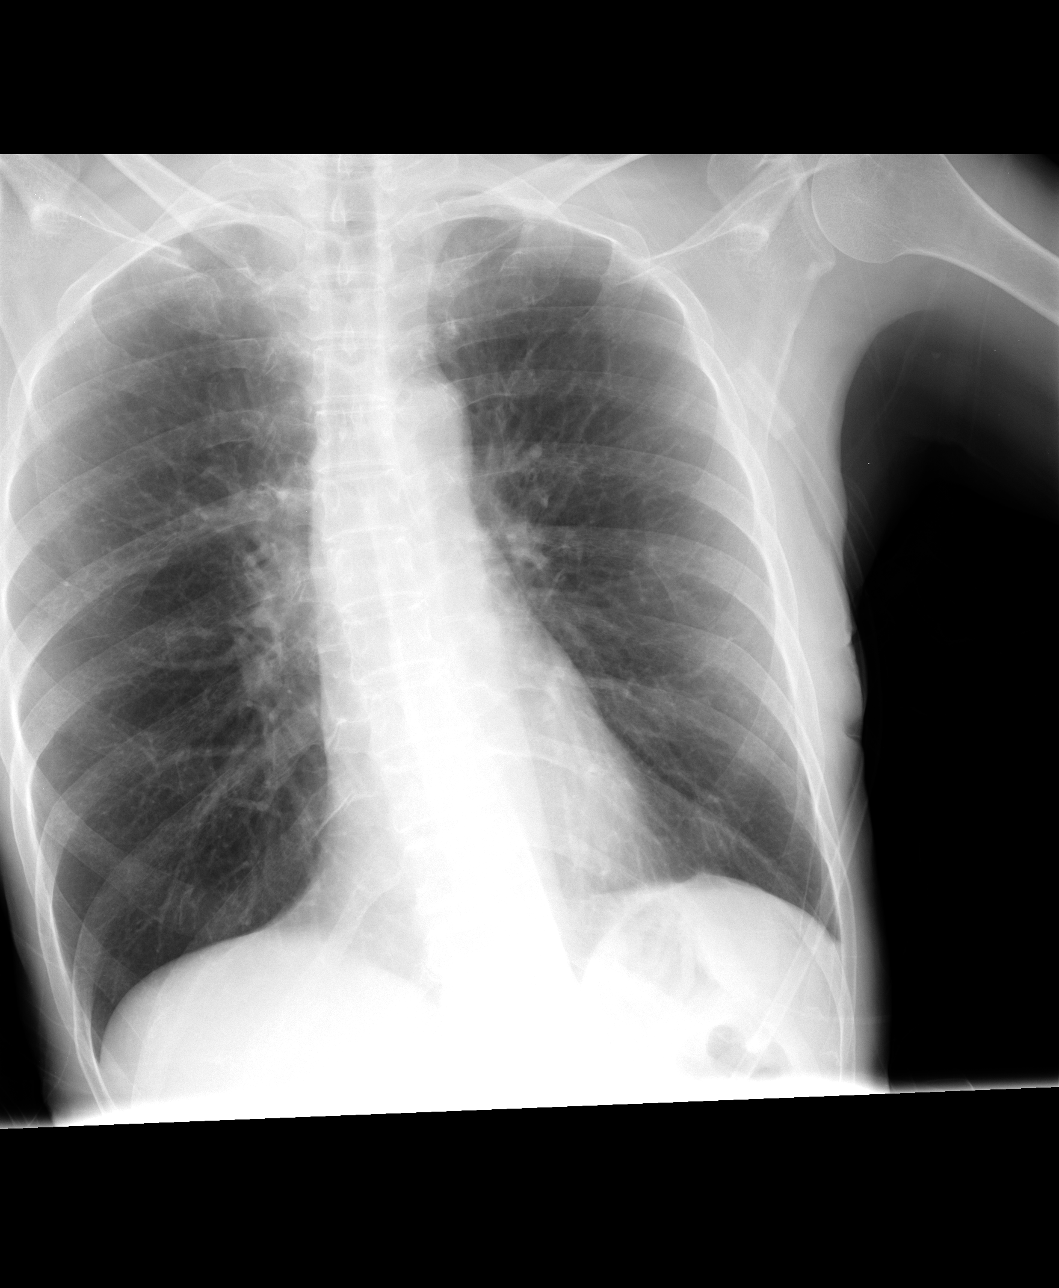

[view not recorded (2 of 3)]
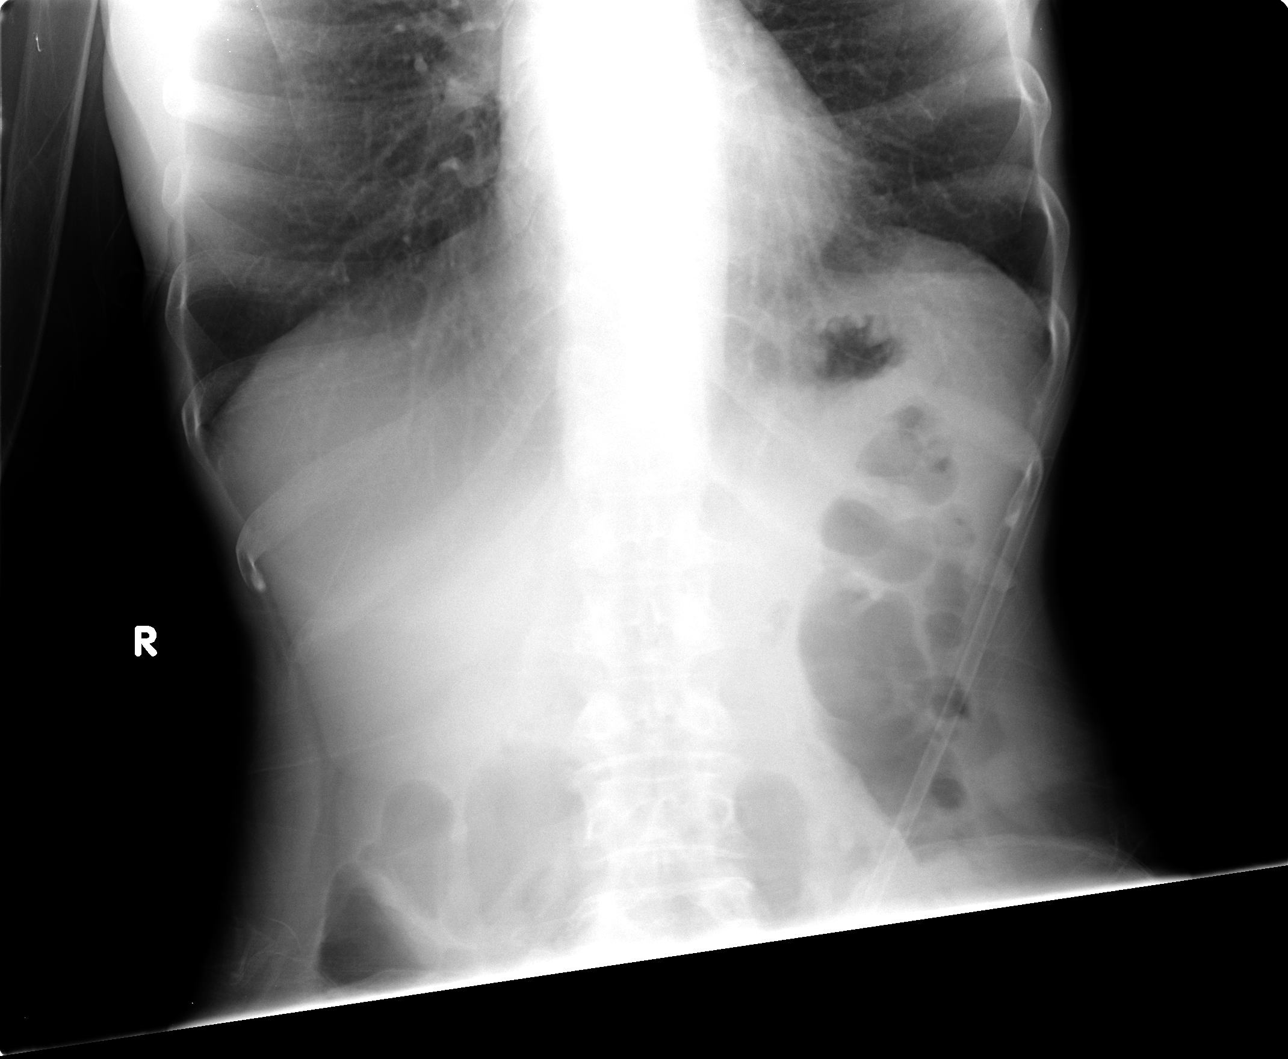

[view not recorded (3 of 3)]
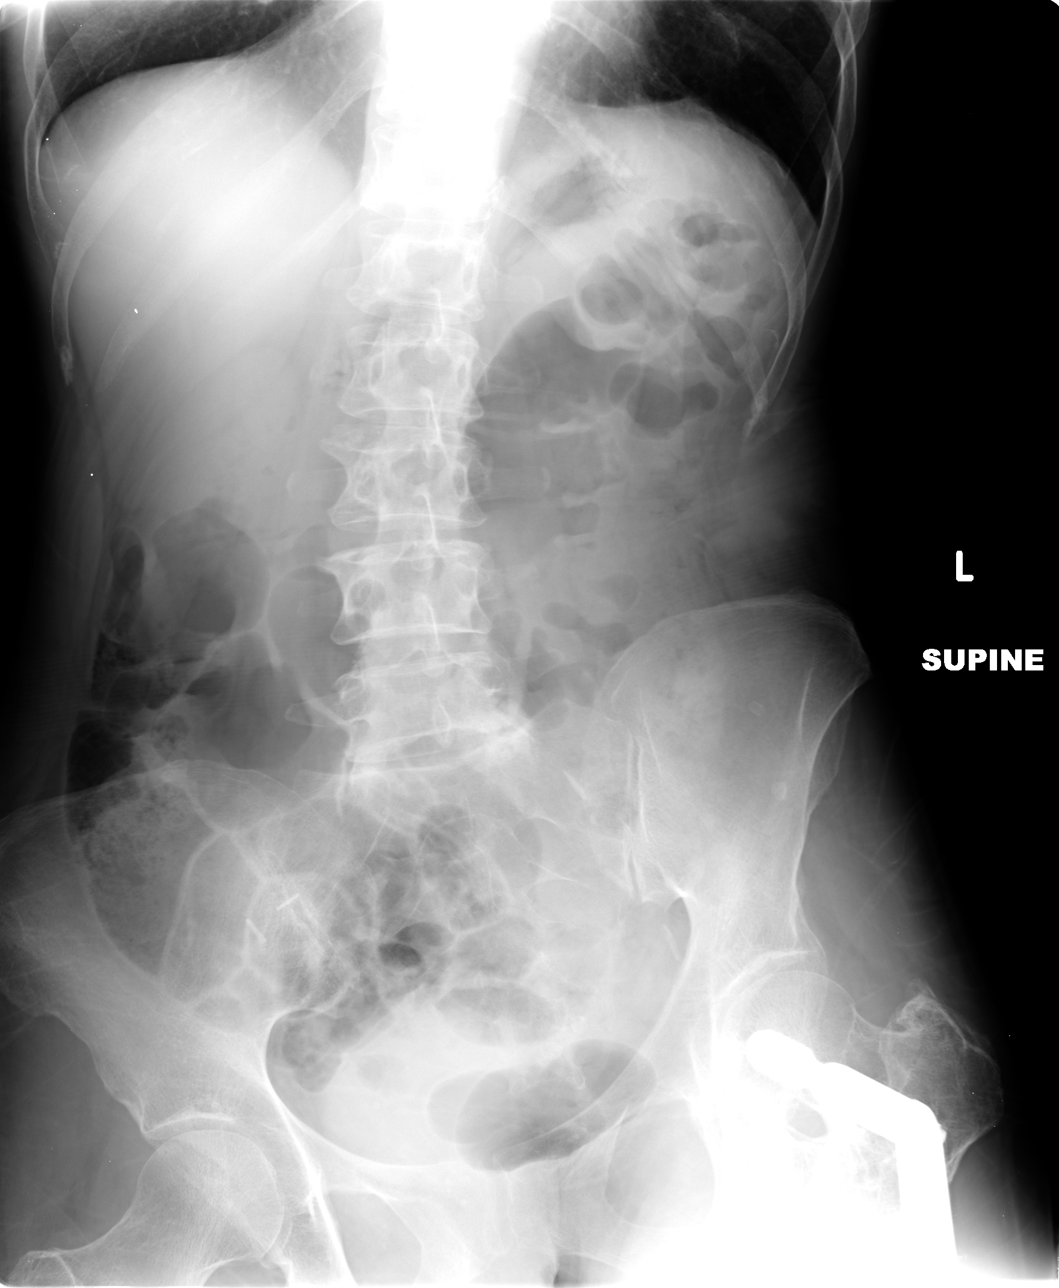

[3 of 3 positions shown; findings below may reference images not displayed]

FINDINGS: Lungs are hyperinflated.  There are perihilar bronchitic
changes.  No focal consolidations or pleural effusions are
identified.  There is no free intraperitoneal air beneath the
diaphragm.

Supine and erect views of the abdomen demonstrate a nonobstructive
bowel gas pattern.  There is mild gaseous distention of colonic
loops without evidence for obstruction.  The patient has had prior
left hip open reduction internal fixation.  There are degenerative
changes in the spine.  Note is made of dextro scoliosis.
IMPRESSION: 1.  COPD and bronchitis.
2.  Nonobstructive bowel gas pattern.

## 2011-05-15 IMAGING — CR DG ABDOMEN ACUTE W/ 1V CHEST
3 series · 3 of 3 positions shown · non-contrast
Comparison: 10/24/1998 and

CLINICAL DATA: Abdominal pain

ACUTE ABDOMEN SERIES (ABDOMEN 2 VIEW & CHEST 1 VIEW)

[w chest pa]
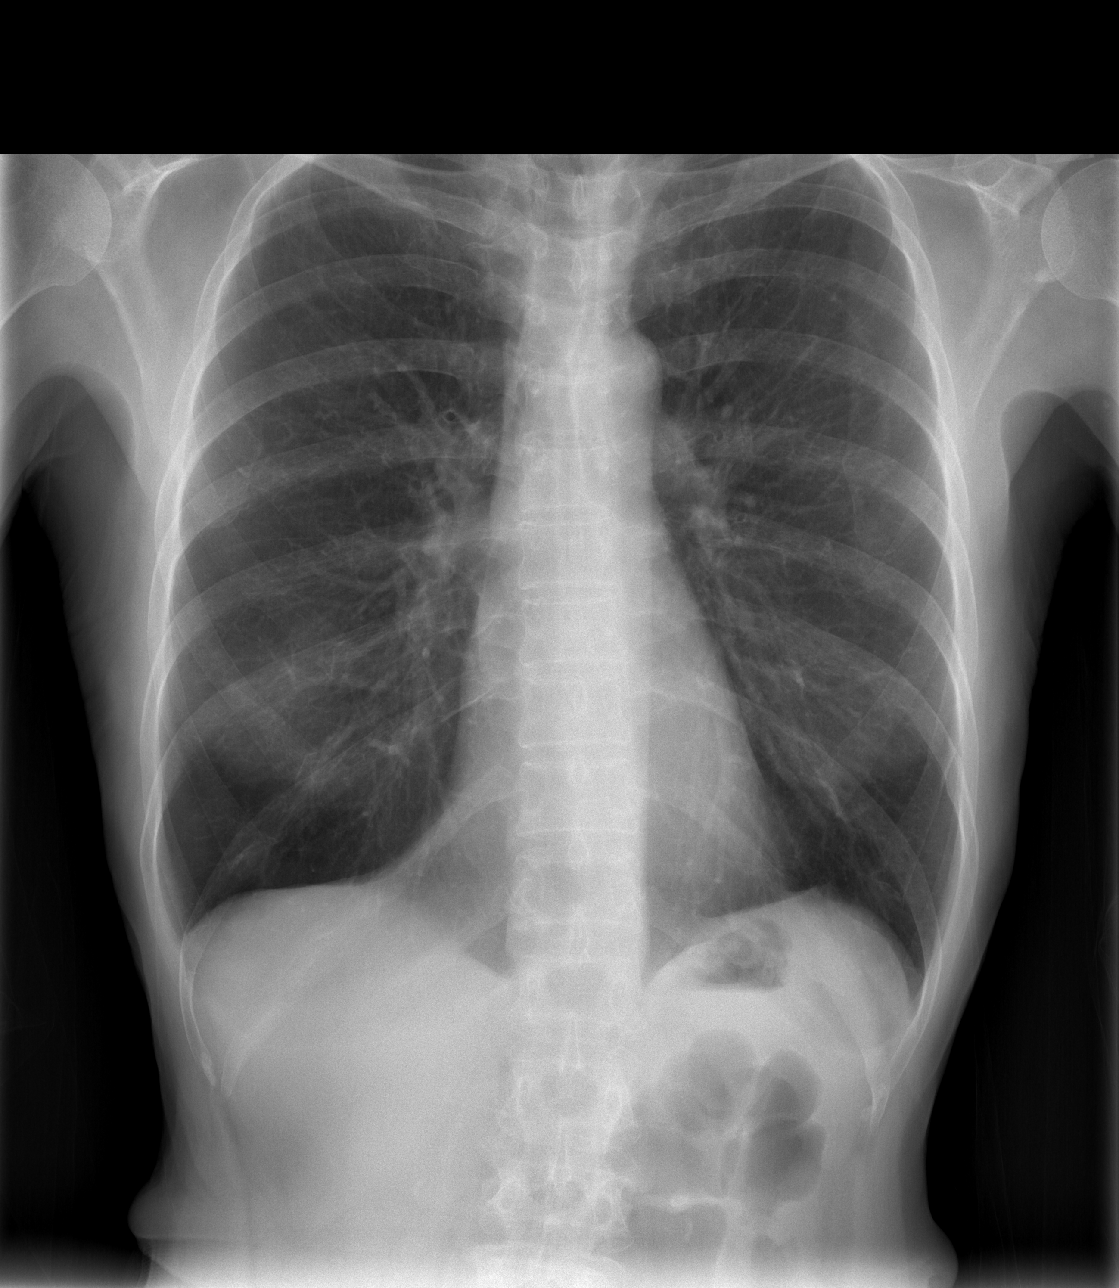

[w abdomen upright *]
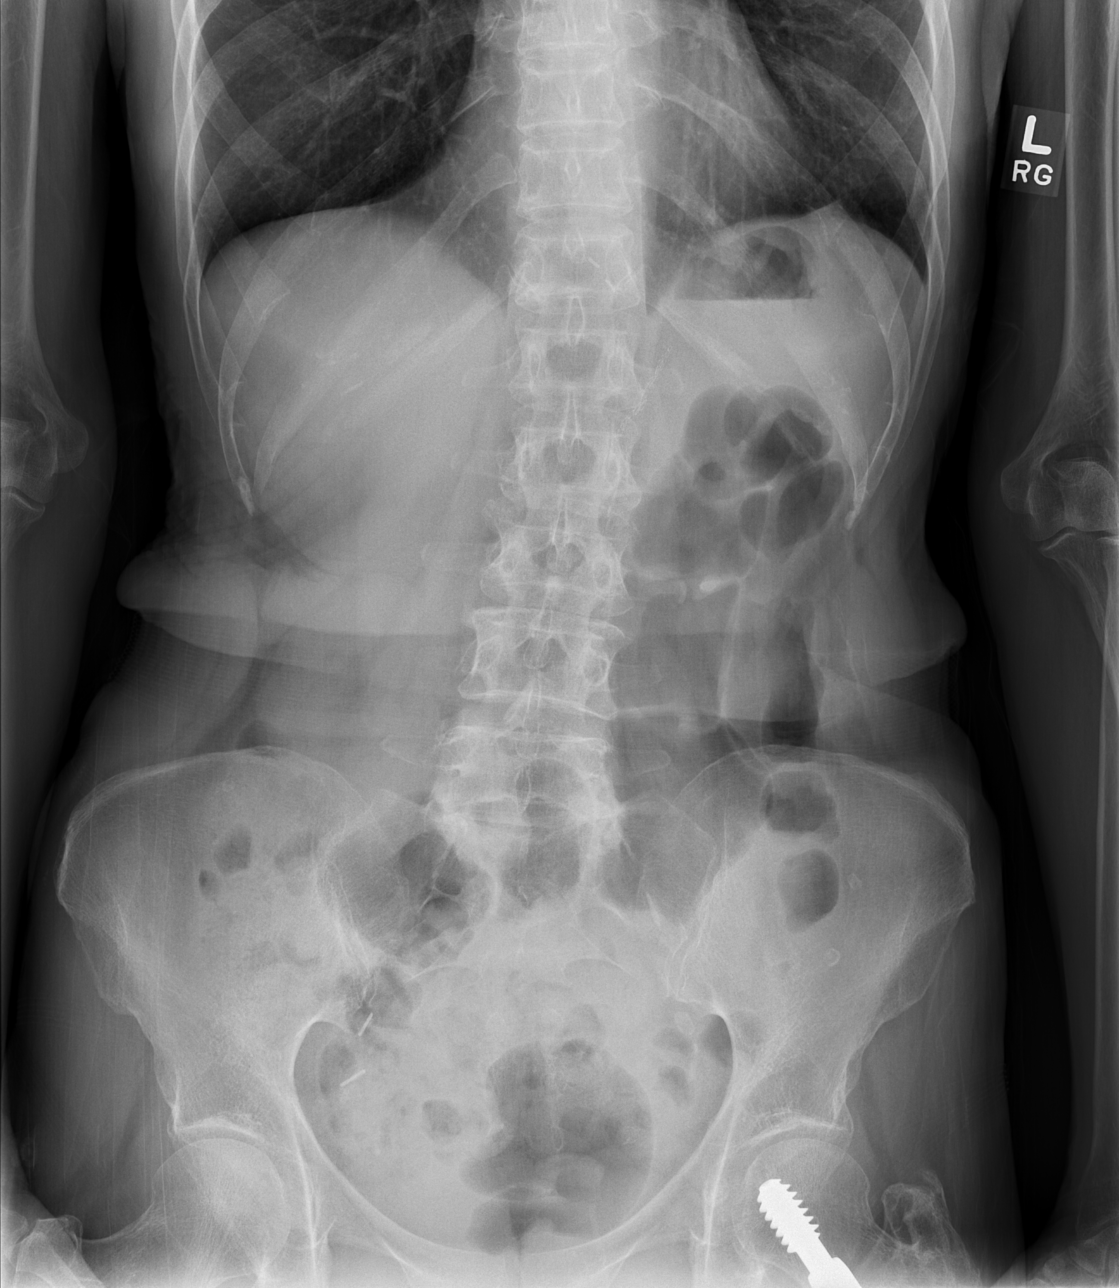

[t abdomen supine]
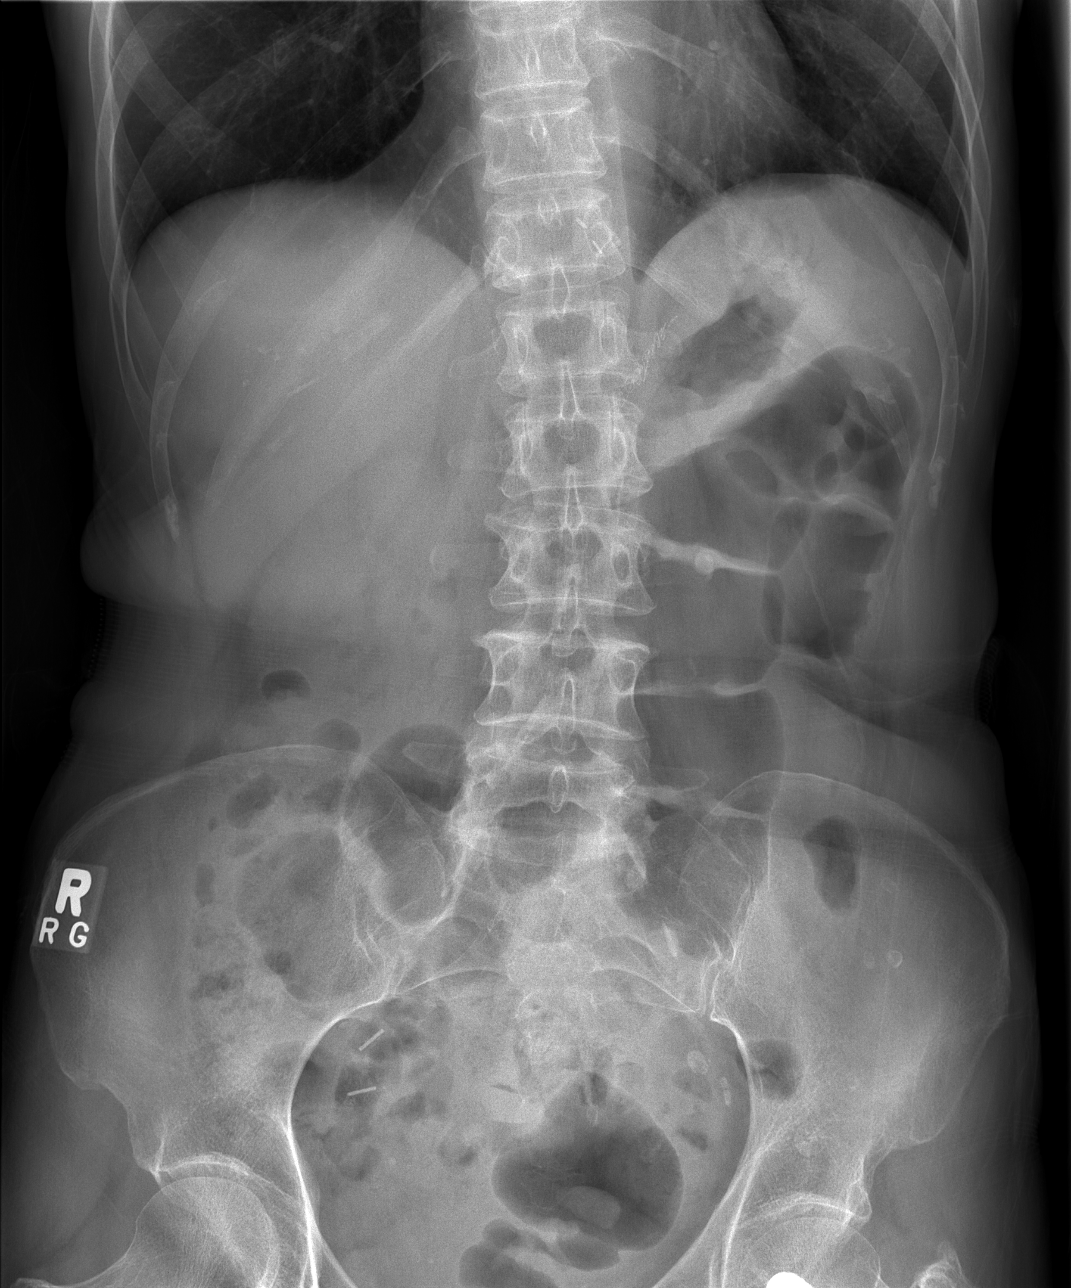

[3 of 3 positions shown; findings below may reference images not displayed]

FINDINGS: Lungs remain hyperaerated with central bronchitic
changes.  No new consolidation or mass.

There is no free intraperitoneal gas in the abdomen.  Gas filled
colon without disproportionate dilatation of small bowel is
present.  Slight levoscoliosis of the lumbar spine.  No
pneumatosis.  Postoperative changes in the right side of the pelvis
and epigastrium.
IMPRESSION: Nonobstructive bowel gas pattern.  Hyperaerated lungs.

## 2011-05-26 ENCOUNTER — Encounter (HOSPITAL_COMMUNITY): Payer: Self-pay | Admitting: *Deleted

## 2011-05-26 ENCOUNTER — Emergency Department (HOSPITAL_COMMUNITY)
Admission: EM | Admit: 2011-05-26 | Discharge: 2011-05-26 | Disposition: A | Discharge status: Home / Self Care | Payer: Medicaid Other | Attending: Emergency Medicine | Admitting: Emergency Medicine

## 2011-05-26 DIAGNOSIS — J4489 Other specified chronic obstructive pulmonary disease: Secondary | ICD-10-CM | POA: Insufficient documentation

## 2011-05-26 DIAGNOSIS — J449 Chronic obstructive pulmonary disease, unspecified: Secondary | ICD-10-CM | POA: Insufficient documentation

## 2011-05-26 DIAGNOSIS — R197 Diarrhea, unspecified: Secondary | ICD-10-CM | POA: Insufficient documentation

## 2011-05-26 DIAGNOSIS — R112 Nausea with vomiting, unspecified: Secondary | ICD-10-CM | POA: Insufficient documentation

## 2011-05-26 DIAGNOSIS — R1013 Epigastric pain: Secondary | ICD-10-CM | POA: Insufficient documentation

## 2011-05-26 DIAGNOSIS — F17200 Nicotine dependence, unspecified, uncomplicated: Secondary | ICD-10-CM | POA: Insufficient documentation

## 2011-05-26 DIAGNOSIS — K859 Acute pancreatitis without necrosis or infection, unspecified: Secondary | ICD-10-CM | POA: Insufficient documentation

## 2011-05-26 MED ORDER — PROMETHAZINE HCL 25 MG RE SUPP: 25.0000 mg | Freq: Four times a day (QID) | RECTAL | Status: DC | PRN

## 2011-05-26 MED ORDER — HYDROMORPHONE HCL PF 1 MG/ML IJ SOLN
1.0000 mg | Freq: Once | INTRAMUSCULAR | Status: AC
  Administered 2011-05-26: 1 mg via INTRAMUSCULAR
  Filled 2011-05-26: qty 1

## 2011-05-26 MED ORDER — OXYCODONE-ACETAMINOPHEN 5-325 MG PO TABS: 1.0000 | ORAL_TABLET | Freq: Four times a day (QID) | ORAL | Status: DC | PRN

## 2011-05-26 MED ORDER — ONDANSETRON 4 MG PO TBDP
4.0000 mg | ORAL_TABLET | Freq: Once | ORAL | Status: AC
  Administered 2011-05-26: 4 mg via ORAL
  Filled 2011-05-26: qty 1

## 2011-05-26 MED ORDER — OXYCODONE-ACETAMINOPHEN 5-325 MG PO TABS
2.0000 | ORAL_TABLET | Freq: Once | ORAL | Status: AC
  Administered 2011-05-26: 2 via ORAL
  Filled 2011-05-26 (×2): qty 2

## 2011-05-26 MED ORDER — PROMETHAZINE HCL 25 MG RE SUPP
25.0000 mg | Freq: Four times a day (QID) | RECTAL | Status: DC | PRN
  Administered 2011-05-26: 25 mg via RECTAL
  Filled 2011-05-26: qty 1

## 2011-05-26 MED ORDER — LORAZEPAM 2 MG/ML IJ SOLN
1.0000 mg | Freq: Once | INTRAMUSCULAR | Status: AC
Start: 2011-05-26 — End: 2011-05-26
  Administered 2011-05-26: 1 mg via INTRAMUSCULAR
  Filled 2011-05-26: qty 1

## 2011-05-26 NOTE — ED Notes (Signed)
Attempted IV x1, called IV team, they stated that they do not attempt IV's on this patient due to chronic history and patients lack of veins and that if patient needs IV access she has to get a PICC or central line. PA notified. Pt states she normally gets IM injections for symptom management.

## 2011-05-26 NOTE — ED Notes (Signed)
Pt calling out repeatedly asking for pain and nausea medication, pt informed that until MD evaluates her that she cannot have anything, MD again notified that patient is requesting pain medication

## 2011-05-26 NOTE — ED Notes (Signed)
RT at bedside to obtain blood work via arterial stick

## 2011-05-26 NOTE — ED Notes (Addendum)
Unsuccessfully attempted to obtain blood for labs x2.RN Irving Burton and PA Tiffany made aware

## 2011-05-26 NOTE — ED Provider Notes (Signed)
History     CSN: [id number]  Arrival date & time 05/26/11  1240   First MD Initiated Contact with Patient 05/26/11 1301      Chief Complaint  Patient presents with  . Pancreatitis     (Consider location/radiation/quality/duration/timing/severity/associated sxs/prior treatment) HPI  Patient presents to the ED with complaints of pancreatitis. Patient has a long history of ER visits for the same. She denies drinking any alcohol or that her pancreatitis is due to alcohol. He admits to having N/V/D for 3 days. She says that she is out of pain medication at home and is unable to tolerate her pain. Her pain is a 10/10 and is in her epigastrium. The patient has chronic pancreatitis that has been more severe for the past 3 days.  Past Medical History  Diagnosis Date  . Pancreatitis   . COPD (chronic obstructive pulmonary disease)   . Substance abuse   . Cancer     renal ca  . Seizures     Past Surgical History  Procedure Date  . Partial gastrectomy     History reviewed. No pertinent family history.  History  Substance Use Topics  . Smoking status: Current Everyday Smoker -- 0.5 packs/day    Types: Cigarettes  . Smokeless tobacco: Never Used  . Alcohol Use: Yes     Hx of Alcohol abuse, reports she "still has an occasional beer", but drinks on her medications    OB History    Grav Para Term Preterm Abortions TAB SAB Ect Mult Living                  Review of Systems   HEENT: denies blurry vision or change in hearing PULMONARY: Denies difficulty breathing and SOB CARDIAC: denies chest pain or heart palpitations MUSCULOSKELETAL:  denies being unable to ambulation GU: denies loss of bowel or urinary control NEURO: denies numbness and tingling in extremities   Allergies  Aspirin and Chlorpromazine hcl  Home Medications   Current Outpatient Rx  Name Route Sig Dispense Refill  . ALBUTEROL SULFATE HFA 108 (90 BASE) MCG/ACT IN AERS Inhalation Inhale 2 puffs into the  lungs every 4 (four) hours as needed. For shortness of breath.    Marland Kitchen FLUTICASONE PROPIONATE  HFA 110 MCG/ACT IN AERO Inhalation Inhale 2 puffs into the lungs 2 (two) times daily. 1 Inhaler 0  . HYDROMORPHONE HCL 4 MG PO TABS Oral Take 1 tablet (4 mg total) by mouth every 6 (six) hours as needed. For pain. 20 tablet 0  . LORAZEPAM 1 MG PO TABS Oral Take 1 tablet (1 mg total) by mouth every 6 (six) hours as needed. For anxiety. 30 tablet 0  . ONDANSETRON HCL 4 MG PO TABS Oral Take 4 mg by mouth every 8 (eight) hours as needed. For nausea.    . OXYCODONE-ACETAMINOPHEN 5-325 MG PO TABS Oral Take 1 tablet by mouth every 6 (six) hours as needed for pain. 10 tablet 0  . PROMETHAZINE HCL 25 MG RE SUPP Rectal Place 1 suppository (25 mg total) rectally every 6 (six) hours as needed for nausea. 12 each 0  . PROMETHAZINE HCL 25 MG PO TABS Oral Take 1 tablet (25 mg total) by mouth every 8 (eight) hours as needed for nausea. 15 tablet 0    BP 160/101  Pulse 89  Temp(Src) 98.9 F (37.2 C) (Oral)  Resp 16  SpO2 100%  Physical Exam  Nursing note and vitals reviewed. Constitutional: She appears well-developed and well-nourished. No  distress (pt appears uncomfrtable).  HENT:  Head: Normocephalic and atraumatic.  Eyes: Pupils are equal, round, and reactive to light.  Neck: Normal range of motion. Neck supple.  Cardiovascular: Normal rate and regular rhythm.   Pulmonary/Chest: Effort normal.  Abdominal: Soft. She exhibits no distension. There is tenderness (epigastric pain). There is no rebound and no guarding.  Neurological: She is alert.  Skin: Skin is warm and dry.    ED Course  Procedures (including critical care time)  Labs Reviewed - No data to display No results found.   1. Pancreatitis       MDM  Pt is a hard stick, RT came down to do an Arterial stick and patient would not allow them to get blood. Her vital signs are stable, she was given IM injections of Dilaudid in ED to manage pain.  PO challenged in ED. Would like to get blood on patient but I am unable and she does not need a central line with normal vital signs.   Pt looked up on Drug database and recieves 120 tabs of 4mg  Dilaudid tabs on the 25th of every month from the same provider Dr. Ronne Binning. I will not refill her medication as she is using more than she is supposed to. I have advised the patient to follow-up with Dr. Ronne Binning is she needs more medication before than        Dorthula Matas, [place] 05/26/11 1927

## 2011-05-26 NOTE — Discharge Instructions (Signed)
Acute Pancreatitis The pancreas is a large gland located behind your stomach. It produces (secretes) enzymes. These enzymes help digest food. It also releases the hormones glucagon and insulin. These hormones help regulate blood sugar. When the pancreas becomes inflamed, the disease is called pancreatitis. Inflammation of the pancreas occurs when enzymes from the pancreas begin attacking and digesting the pancreas. CAUSES  Most cases ofsudden onset (acute) pancreatitis are caused by:  Alcohol abuse.   Gallstones.  Other less common causes are:  Some medications.   Exposure to certain chemicals   Infection.   Damage caused by an accident (trauma).   Surgery of the belly (abdomen).  SYMPTOMS  Acute pancreatitis usually begins with pain in the upper abdomen and may radiate to the back. This pain may last a couple days. The constant pain varies from mild to severe. The acute form of this disease may vary from mild, nonspecific abdominal pain to profound shock with coma. About 1 in 5 cases are severe. These patients become dehydrated and develop low blood pressure. In severe cases, bleeding into the pancreas can lead to shock and death. The lungs, heart, and kidneys may fail. DIAGNOSIS  Your caregiver will form a clinical opinion after giving you an exam. Laboratory work is used to confirm this diagnosis. Often,a digestive enzyme from the pancreas (serum amylase) and other enzymes are elevated. Sugars and fats (lipids) in the blood may be elevated. There may also be changes in the following levels: calcium, magnesium, potassium, chloride and bicarbonate (chemicals in the blood). X-rays, a CT scan, or ultrasound of your abdomen may be necessary to search for other causes of your abdominal pain. TREATMENT  Most pancreatitis requires treatment of symptoms. Most acute attacks last a couple of days. Your caregiver can discuss the treatment options with you.  If complications occur, hospitalization  may be necessary for pain control and intravenous (IV) fluid replacement.   Sometimes, a tube may be put into the stomach to control vomiting.   Food may not be allowed for 3 to 4 days. This gives the pancreas time to rest. Giving the pancreas a rest means there is no stimulation that would produce more enzymes and cause more damage.   Medicines (antibiotics) that kill germs may be given if infection is the cause.   Sometimes, surgery may be required.   Following an acute attack, your caregiver will determine the cause, if possible, and offer suggestions to prevent recurrences.  HOME CARE INSTRUCTIONS   Eat smaller, more frequent meals. This reduces the amount of digestive juices the pancreas produces.   Decrease the amount of fat in your diet. This may help reduce loose, diarrheal stools.   Drink enough water and fluids to keep your urine clear or pale yellow. This is to avoid dehydration which can cause increased pain.   Talk to your caregiver about pain relievers or other medicines that may help.   Avoid anything that may have triggered your pancreatitis (for example, alcohol).   Follow the diet advised by your caregiver. Do not advance the diet too soon.   Take medicines as prescribed.   Get plenty of rest.   Check your blood sugar at home as directed by your caregiver.   If your caregiver has given you a follow-up appointment, it is very important to keep that appointment. Not keeping the appointment could result in a lasting (chronic) or permanent injury, pain, and disability. If there is any problem keeping the appointment, you must call to reschedule.    SEEK MEDICAL CARE IF:   You are not recovering in the time described by your caregiver.   You have persistent pain, weakness, or feel sick to your stomach (nauseous).   You have recovered and then have another bout of pain.  SEEK IMMEDIATE MEDICAL CARE IF:   You are unable to eat or keep fluids down.   Your pain  increases a lot or changes.   You have an oral temperature above 102 F (38.9 C), not controlled by medicine.   Your skin or the white part of your eyes look yellow (jaundice).   You develop vomiting.   You feel dizzy or faint.   Your blood sugar is high (over 300).  MAKE SURE YOU:   Understand these instructions.   Will watch your condition.   Will get help right away if you are not doing well or get worse.  Document Released: 12/21/2004 Document Revised: 12/10/2010 Document Reviewed: 08/04/2007 [employer] Patient Information 2012 [employer]. 

## 2011-05-26 NOTE — ED Notes (Signed)
Pt in c/o episode of pancreatitis, history of same, last drank 1 beer three days ago, c/o abd pain with n/v/d x3 days, worse today, out of pain medication at home

## 2011-05-29 NOTE — ED Provider Notes (Signed)
History/physical exam/procedure(s) were performed by non-physician practitioner and as supervising physician I was immediately available for consultation/collaboration. I have reviewed all notes and am in agreement with care and plan.   Hilario Quarry, MD 05/29/11 [phone]

## 2011-05-30 IMAGING — CR DG ABDOMEN ACUTE W/ 1V CHEST
3 series · 3 of 3 positions shown · non-contrast
Comparison: 10/30/2008

CLINICAL DATA: Vomiting and abdominal pain.

ACUTE ABDOMEN SERIES (ABDOMEN 2 VIEW & CHEST 1 VIEW)

[w chest pa *]
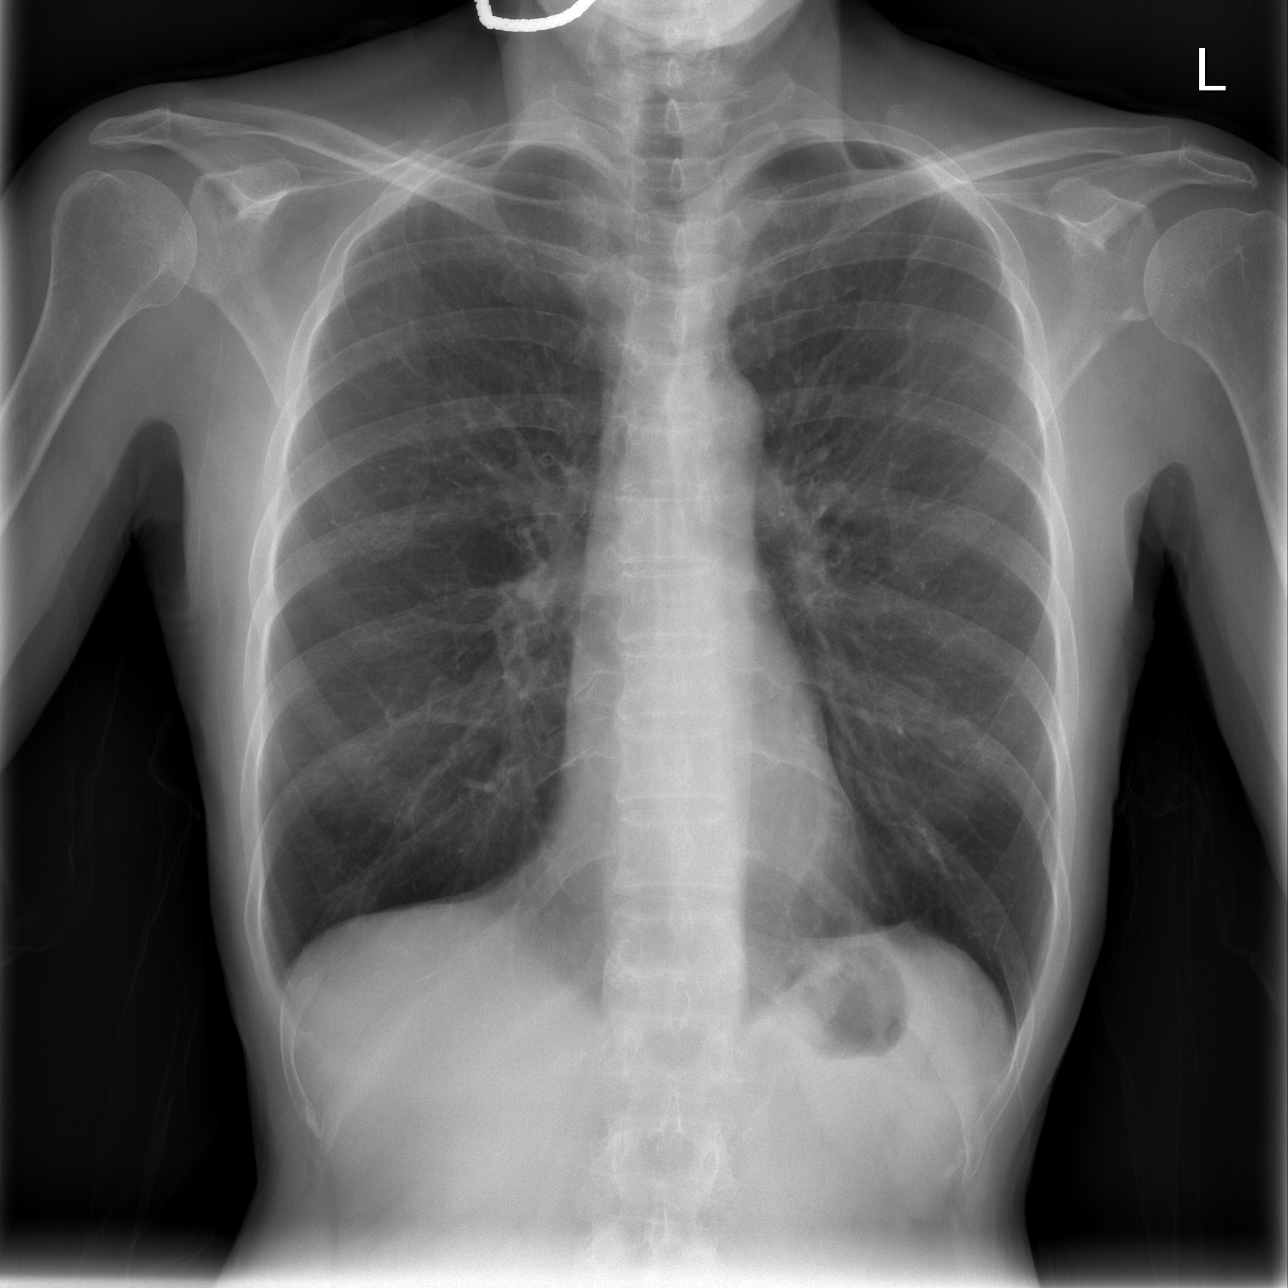

[w abdomen upright *]
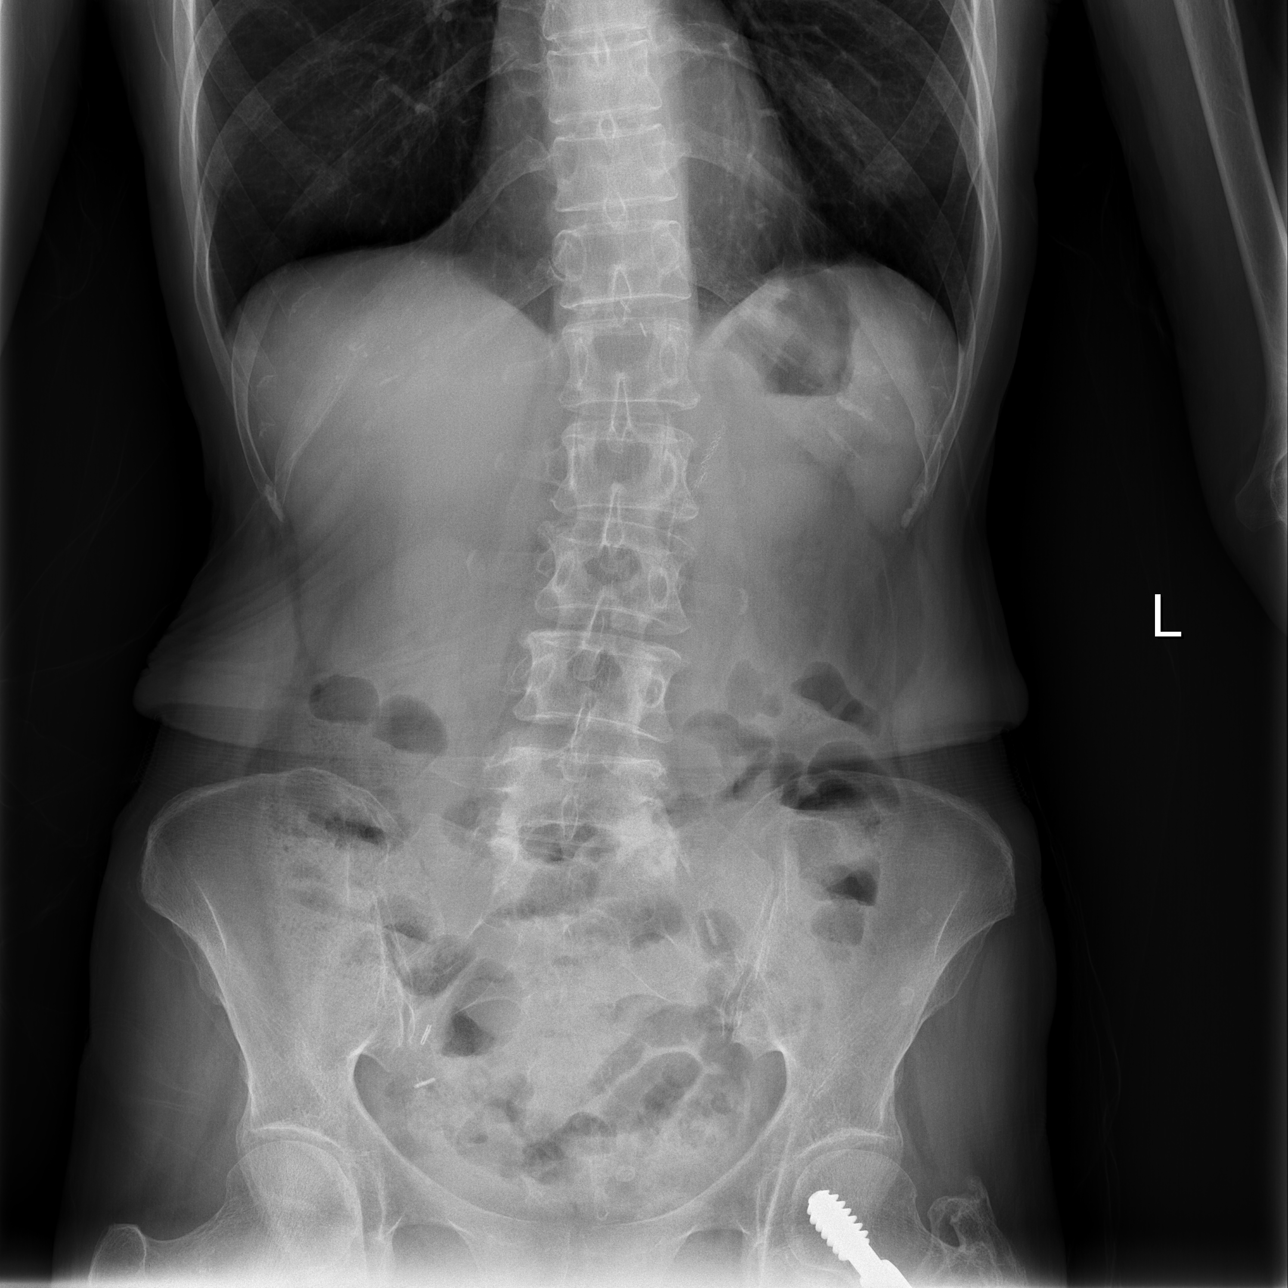

[t abdomen supine]
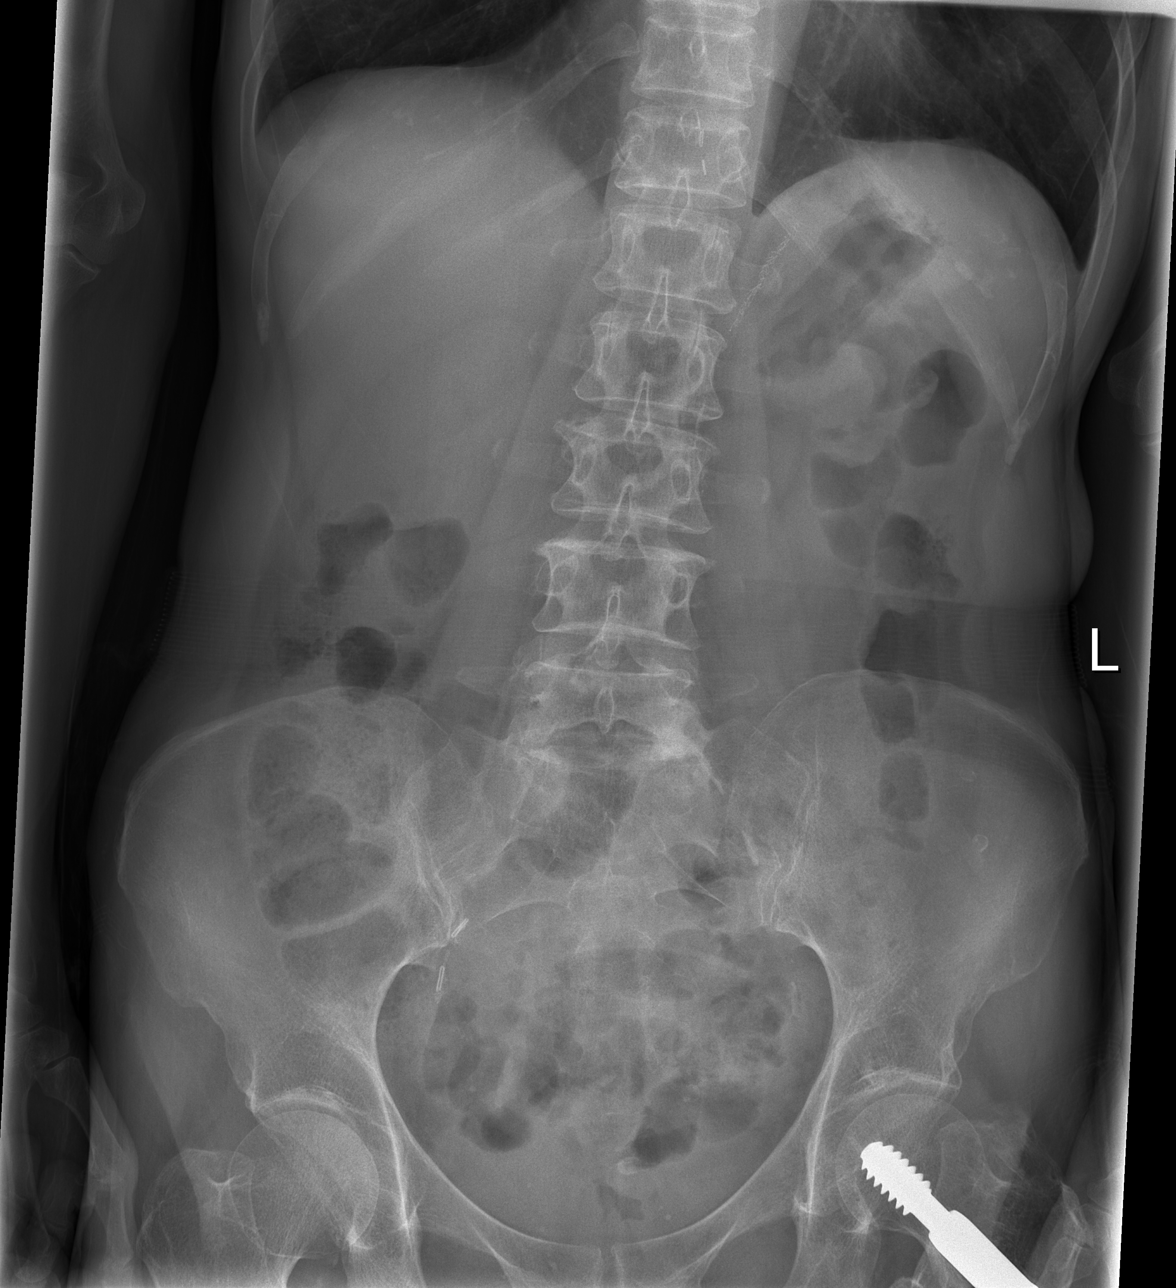

[3 of 3 positions shown; findings below may reference images not displayed]

FINDINGS: The upright chest x-ray demonstrates stable changes of
COPD with hyperinflation.  No acute pulmonary findings.

Two views of the abdomen demonstrate an unremarkable bowel gas
pattern.  No findings for obstruction or perforation.  The soft
tissue shadows of the abdomen are maintained.  No worrisome
calcifications are seen.  Surgical changes are noted.  The bony
structures are unremarkable.  A left sided dynamic hip screw is
noted.
IMPRESSION: 1.  No acute cardiopulmonary findings.  Stable changes of COPD.
2.  No plain film findings for an acute abdominal process.

## 2011-06-04 ENCOUNTER — Emergency Department (HOSPITAL_COMMUNITY)
Admission: EM | Admit: 2011-06-04 | Discharge: 2011-06-05 | Disposition: A | Discharge status: Home / Self Care | Payer: Medicaid Other | Attending: Emergency Medicine | Admitting: Emergency Medicine

## 2011-06-04 DIAGNOSIS — J4489 Other specified chronic obstructive pulmonary disease: Secondary | ICD-10-CM | POA: Insufficient documentation

## 2011-06-04 DIAGNOSIS — R109 Unspecified abdominal pain: Secondary | ICD-10-CM | POA: Insufficient documentation

## 2011-06-04 DIAGNOSIS — J449 Chronic obstructive pulmonary disease, unspecified: Secondary | ICD-10-CM | POA: Insufficient documentation

## 2011-06-04 DIAGNOSIS — G8929 Other chronic pain: Secondary | ICD-10-CM | POA: Insufficient documentation

## 2011-06-04 DIAGNOSIS — R6884 Jaw pain: Secondary | ICD-10-CM | POA: Insufficient documentation

## 2011-06-04 DIAGNOSIS — R4701 Aphasia: Secondary | ICD-10-CM | POA: Insufficient documentation

## 2011-06-04 DIAGNOSIS — F1010 Alcohol abuse, uncomplicated: Secondary | ICD-10-CM | POA: Insufficient documentation

## 2011-06-04 LAB — COMPREHENSIVE METABOLIC PANEL
Alkaline Phosphatase: 93 U/L (ref 39–117)
BUN: 6 mg/dL (ref 6–23)
Chloride: 99 mEq/L (ref 96–112)
GFR calc Af Amer: >90 mL/min (ref >90)
Glucose, Bld: 86 mg/dL (ref 70–99)
Potassium: 4 mEq/L (ref 3.5–5.1)
Total Bilirubin: 0.2 mg/dL — ABNORMAL LOW (ref 0.3–1.2)

## 2011-06-04 LAB — DIFFERENTIAL
Lymphs Abs: 1.7 10*3/uL (ref 0.7–4.0)
Monocytes Relative: 6 % (ref 3–12)
Neutro Abs: 3.6 10*3/uL (ref 1.7–7.7)
Neutrophils Relative %: 62 % (ref 43–77)

## 2011-06-04 LAB — CBC
Hemoglobin: 13.9 g/dL (ref 12.0–15.0)
MCH: 31.5 pg (ref 26.0–34.0)
RBC: 4.41 MIL/uL (ref 3.87–5.11)

## 2011-06-04 LAB — LIPASE, BLOOD: Lipase: 34 U/L (ref 11–59)

## 2011-06-04 LAB — URINALYSIS, ROUTINE W REFLEX MICROSCOPIC
Bilirubin Urine: NEGATIVE
Ketones, ur: NEGATIVE mg/dL
Nitrite: NEGATIVE
Urobilinogen, UA: 0.2 mg/dL (ref 0.0–1.0)

## 2011-06-04 LAB — RAPID URINE DRUG SCREEN, HOSP PERFORMED
Barbiturates: NOT DETECTED
Cocaine: NOT DETECTED
Tetrahydrocannabinol: NOT DETECTED

## 2011-06-04 MED ORDER — SODIUM CHLORIDE 0.9 % IV BOLUS (SEPSIS): 1000.0000 mL | Freq: Once | INTRAVENOUS | Status: DC

## 2011-06-04 MED ORDER — SODIUM CHLORIDE 0.9 % IV SOLN: Freq: Once | INTRAVENOUS | Status: DC

## 2011-06-04 MED ORDER — ONDANSETRON HCL 4 MG/2ML IJ SOLN: 4.0000 mg | Freq: Once | INTRAMUSCULAR | Status: DC

## 2011-06-04 NOTE — ED Notes (Signed)
QVZ:[id number]<VF> Expected date:<BR> Expected time:<BR> Means of arrival:<BR> Comments:<BR> EMS 251 GC, 60 yof pancreatitis

## 2011-06-04 NOTE — ED Notes (Signed)
Pt walked out of room and stated she was leaving, patient went out front doors, did not wait for d/c instructions

## 2011-06-04 NOTE — Discharge Instructions (Signed)
See Dr Ronne Binning this week to finish your evaluation for possible "mini stroke".  Return to the ED if you have symptoms again.

## 2011-06-04 NOTE — ED Notes (Addendum)
Pt in c/o abd pain with nausea x2 hours, ETOH 3 hours ago, admits to three beers, also c/o mouth pain, states it hurts to move her mouth but denies injury

## 2011-06-04 NOTE — ED Notes (Signed)
Pt c/o abominal pain that started about 45 minutes ago, generalized, pain 10/10. 140/90, hr 90, nsr on the monitor. Pt also c/o jaw pain that started about the same time (has been having intermittent jaw pain for several weeks). Pt has hx of pancreatitis.

## 2011-06-04 NOTE — ED Provider Notes (Signed)
History     CSN: [id number]  Arrival date & time 06/04/11  2154   First MD Initiated Contact with Patient 06/04/11 2300      Chief Complaint  Patient presents with  . Abdominal Pain  . Jaw Pain    (Consider location/radiation/quality/duration/timing/severity/associated sxs/prior treatment) HPI  Pt tells me about 2 hours ago she was having difficulty speaking which has now resolved, no difficulty moving your arms or legs. States she has had weakness in her LUE for the past 6 months. She hasn't told Dr Tarri Fuller about this.  Denies headache. Denies having difficulty speaking before  PT has been coming to the ED for many years for alcoholism, narcotic drug abuse and chronic abdominal pain. She relates she is having abdominal pain with nausea, vomiting but without diarrhea.  Pt has been drinking with her husband tonight.  PCP Dr Ronne Binning  Past Medical History  Diagnosis Date  . Pancreatitis   . COPD (chronic obstructive pulmonary disease)   . Substance abuse   . Cancer     renal ca  . Seizures     Past Surgical History  Procedure Date  . Partial gastrectomy     No family history on file.  History  Substance Use Topics  . Smoking status: Current Everyday Smoker -- 0.5 packs/day    Types: Cigarettes  . Smokeless tobacco: Never Used  . Alcohol Use: Yes     Hx of Alcohol abuse, reports she "still has an occasional beer", but drinks on her medications  lives at home with husband On disability  OB History    Grav Para Term Preterm Abortions TAB SAB Ect Mult Living                  Review of Systems  All other systems reviewed and are negative.    Allergies  Aspirin and Chlorpromazine hcl  Home Medications   Current Outpatient Rx  Name Route Sig Dispense Refill  . ALBUTEROL SULFATE HFA 108 (90 BASE) MCG/ACT IN AERS Inhalation Inhale 2 puffs into the lungs every 4 (four) hours as needed. For shortness of breath.    Marland Kitchen FLUTICASONE PROPIONATE  HFA 110 MCG/ACT IN  AERO Inhalation Inhale 2 puffs into the lungs 2 (two) times daily. 1 Inhaler 0  . HYDROMORPHONE HCL 4 MG PO TABS Oral Take 1 tablet (4 mg total) by mouth every 6 (six) hours as needed. For pain. 20 tablet 0  . LORAZEPAM 1 MG PO TABS Oral Take 1 tablet (1 mg total) by mouth every 6 (six) hours as needed. For anxiety. 30 tablet 0  . ONDANSETRON HCL 4 MG PO TABS Oral Take 4 mg by mouth every 8 (eight) hours as needed. For nausea.    Marland Kitchen PROMETHAZINE HCL 25 MG RE SUPP Rectal Place 1 suppository (25 mg total) rectally every 6 (six) hours as needed for nausea. 12 each 0  . PROMETHAZINE HCL 25 MG PO TABS Oral Take 1 tablet (25 mg total) by mouth every 8 (eight) hours as needed for nausea. 15 tablet 0    BP 165/105  Pulse 92  Temp(Src) 98.2 F (36.8 C) (Oral)  Resp 20  SpO2 98%  Vital signs normal    Physical Exam  Nursing note and vitals reviewed. Constitutional: She is oriented to person, place, and time.  Non-toxic appearance. She does not appear ill. No distress.       Small, underweight  HENT:  Head: Normocephalic and atraumatic.  Right Ear: External ear  normal.  Left Ear: External ear normal.  Nose: Nose normal. No mucosal edema or rhinorrhea.  Mouth/Throat: Oropharynx is clear and moist and mucous membranes are normal. No dental abscesses or uvula swelling.  Eyes: Conjunctivae and EOM are normal. Pupils are equal, round, and reactive to light.  Neck: Normal range of motion and full passive range of motion without pain. Neck supple.  Cardiovascular: Normal rate, regular rhythm and normal heart sounds.  Exam reveals no gallop and no friction rub.   No murmur heard. Pulmonary/Chest: Effort normal and breath sounds normal. No respiratory distress. She has no wheezes. She has no rhonchi. She has no rales. She exhibits no tenderness and no crepitus.  Abdominal: Soft. Normal appearance and bowel sounds are normal. She exhibits no distension. There is no tenderness. There is no rebound and no  guarding.  Musculoskeletal: Normal range of motion. She exhibits no edema and no tenderness.       Moves all extremities well.   Neurological: She is alert and oriented to person, place, and time. She has normal strength. No cranial nerve deficit.       Mild pronator drift on the left which she states is old.   Skin: Skin is warm, dry and intact. No rash noted. No erythema. No pallor.  Psychiatric: She has a normal mood and affect. Her speech is normal and behavior is normal. Her mood appears not anxious.    ED Course  Procedures (including critical care time) When I entered the room patient was walking in the room, getting the phone off the wall to call a taxi to pick her up, states her husband is "passed out at home'. Pt doesn't want to stay.   PT had head CT on 4/3 showing atrophy  Results for orders placed during the hospital encounter of 06/04/11  CBC      Component Value Range   WBC 5.8  4.0 - 10.5 (K/uL)   RBC 4.41  3.87 - 5.11 (MIL/uL)   Hemoglobin 13.9  12.0 - 15.0 (g/dL)   HCT 16.1  09.6 - 04.5 (%)   MCV 91.2  78.0 - 100.0 (fL)   MCH 31.5  26.0 - 34.0 (pg)   MCHC 34.6  30.0 - 36.0 (g/dL)   RDW 40.9  81.1 - 91.4 (%)   Platelets 224  150 - 400 (K/uL)  DIFFERENTIAL      Component Value Range   Neutrophils Relative 62  43 - 77 (%)   Neutro Abs 3.6  1.7 - 7.7 (K/uL)   Lymphocytes Relative 29  12 - 46 (%)   Lymphs Abs 1.7  0.7 - 4.0 (K/uL)   Monocytes Relative 6  3 - 12 (%)   Monocytes Absolute 0.3  0.1 - 1.0 (K/uL)   Eosinophils Relative 3  0 - 5 (%)   Eosinophils Absolute 0.2  0.0 - 0.7 (K/uL)   Basophils Relative 1  0 - 1 (%)   Basophils Absolute 0.0  0.0 - 0.1 (K/uL)  COMPREHENSIVE METABOLIC PANEL      Component Value Range   Sodium 139  135 - 145 (mEq/L)   Potassium 4.0  3.5 - 5.1 (mEq/L)   Chloride 99  96 - 112 (mEq/L)   CO2 23  19 - 32 (mEq/L)   Glucose, Bld 86  70 - 99 (mg/dL)   BUN 6  6 - 23 (mg/dL)   Creatinine, Ser 7.82  0.50 - 1.10 (mg/dL)   Calcium 9.5   8.4 - 10.5 (  mg/dL)   Total Protein 7.3  6.0 - 8.3 (g/dL)   Albumin 3.7  3.5 - 5.2 (g/dL)   AST 24  0 - 37 (U/L)   ALT 15  0 - 35 (U/L)   Alkaline Phosphatase 93  39 - 117 (U/L)   Total Bilirubin 0.2 (*) 0.3 - 1.2 (mg/dL)   GFR calc non Af Amer >90  >90 (mL/min)   GFR calc Af Amer >90  >90 (mL/min)  LIPASE, BLOOD      Component Value Range   Lipase 34  11 - 59 (U/L)  URINALYSIS, ROUTINE W REFLEX MICROSCOPIC      Component Value Range   Color, Urine YELLOW  YELLOW    APPearance CLEAR  CLEAR    Specific Gravity, Urine 1.009  1.005 - 1.030    pH 6.5  5.0 - 8.0    Glucose, UA NEGATIVE  NEGATIVE (mg/dL)   Hgb urine dipstick SMALL (*) NEGATIVE    Bilirubin Urine NEGATIVE  NEGATIVE    Ketones, ur NEGATIVE  NEGATIVE (mg/dL)   Protein, ur NEGATIVE  NEGATIVE (mg/dL)   Urobilinogen, UA 0.2  0.0 - 1.0 (mg/dL)   Nitrite NEGATIVE  NEGATIVE    Leukocytes, UA NEGATIVE  NEGATIVE   URINE RAPID DRUG SCREEN (HOSP PERFORMED)      Component Value Range   Opiates NONE DETECTED  NONE DETECTED    Cocaine NONE DETECTED  NONE DETECTED    Benzodiazepines NONE DETECTED  NONE DETECTED    Amphetamines NONE DETECTED  NONE DETECTED    Tetrahydrocannabinol NONE DETECTED  NONE DETECTED    Barbiturates NONE DETECTED  NONE DETECTED   URINE MICROSCOPIC-ADD ON      Component Value Range   Squamous Epithelial / LPF RARE  RARE    WBC, UA 0-2  <3 (WBC/hpf)   Laboratory interpretation all normal except        1. Expressive aphasia   2. Alcohol abuse   3. Chronic abdominal pain    Plan discharge  Devoria Albe, MD, FACEP    MDM          Ward Givens, MD 06/05/11 [phone]

## 2011-06-07 ENCOUNTER — Emergency Department (HOSPITAL_COMMUNITY): Payer: Medicaid Other

## 2011-06-07 ENCOUNTER — Emergency Department (HOSPITAL_COMMUNITY)
Admission: EM | Admit: 2011-06-07 | Discharge: 2011-06-07 | Disposition: A | Discharge status: Home / Self Care | Payer: Medicaid Other | Attending: Emergency Medicine | Admitting: Emergency Medicine

## 2011-06-07 ENCOUNTER — Encounter (HOSPITAL_COMMUNITY): Payer: Self-pay | Admitting: *Deleted

## 2011-06-07 DIAGNOSIS — R4781 Slurred speech: Secondary | ICD-10-CM

## 2011-06-07 DIAGNOSIS — R4789 Other speech disturbances: Secondary | ICD-10-CM | POA: Insufficient documentation

## 2011-06-07 DIAGNOSIS — J4489 Other specified chronic obstructive pulmonary disease: Secondary | ICD-10-CM | POA: Insufficient documentation

## 2011-06-07 DIAGNOSIS — Z79899 Other long term (current) drug therapy: Secondary | ICD-10-CM | POA: Insufficient documentation

## 2011-06-07 DIAGNOSIS — J449 Chronic obstructive pulmonary disease, unspecified: Secondary | ICD-10-CM | POA: Insufficient documentation

## 2011-06-07 DIAGNOSIS — R112 Nausea with vomiting, unspecified: Secondary | ICD-10-CM | POA: Insufficient documentation

## 2011-06-07 LAB — COMPREHENSIVE METABOLIC PANEL
ALT: 14 U/L (ref 0–35)
Alkaline Phosphatase: 101 U/L (ref 39–117)
BUN: 8 mg/dL (ref 6–23)
Chloride: 93 mEq/L — ABNORMAL LOW (ref 96–112)
GFR calc Af Amer: >90 mL/min (ref >90)
Glucose, Bld: 83 mg/dL (ref 70–99)
Potassium: 4.2 mEq/L (ref 3.5–5.1)
Sodium: 132 mEq/L — ABNORMAL LOW (ref 135–145)
Total Bilirubin: 0.3 mg/dL (ref 0.3–1.2)
Total Protein: 7.2 g/dL (ref 6.0–8.3)

## 2011-06-07 LAB — RAPID URINE DRUG SCREEN, HOSP PERFORMED
Amphetamines: NOT DETECTED
Tetrahydrocannabinol: NOT DETECTED

## 2011-06-07 LAB — DIFFERENTIAL
Eosinophils Absolute: 0.1 10*3/uL (ref 0.0–0.7)
Lymphocytes Relative: 20 % (ref 12–46)
Lymphs Abs: 1.4 10*3/uL (ref 0.7–4.0)
Monocytes Relative: 8 % (ref 3–12)
Neutro Abs: 5 10*3/uL (ref 1.7–7.7)
Neutrophils Relative %: 70 % (ref 43–77)

## 2011-06-07 LAB — URINALYSIS, ROUTINE W REFLEX MICROSCOPIC
Glucose, UA: NEGATIVE mg/dL
Ketones, ur: NEGATIVE mg/dL
Protein, ur: NEGATIVE mg/dL
Urobilinogen, UA: 0.2 mg/dL (ref 0.0–1.0)

## 2011-06-07 LAB — GLUCOSE, CAPILLARY: Glucose-Capillary: 78 mg/dL (ref 70–99)

## 2011-06-07 LAB — CBC
Hemoglobin: 13.6 g/dL (ref 12.0–15.0)
MCH: 31.1 pg (ref 26.0–34.0)
Platelets: 194 10*3/uL (ref 150–400)
RBC: 4.38 MIL/uL (ref 3.87–5.11)
WBC: 7 10*3/uL (ref 4.0–10.5)

## 2011-06-07 LAB — LIPASE, BLOOD: Lipase: 40 U/L (ref 11–59)

## 2011-06-07 MED ORDER — ONDANSETRON 4 MG PO TBDP
4.0000 mg | ORAL_TABLET | ORAL | Status: AC
  Administered 2011-06-07: 4 mg via ORAL
  Filled 2011-06-07: qty 1

## 2011-06-07 MED ORDER — SODIUM CHLORIDE 0.9 % IV BOLUS (SEPSIS)
1000.0000 mL | INTRAVENOUS | Status: AC
  Administered 2011-06-07: 1000 mL via INTRAVENOUS

## 2011-06-07 MED ORDER — HYDROMORPHONE HCL PF 1 MG/ML IJ SOLN
0.5000 mg | Freq: Once | INTRAMUSCULAR | Status: AC
  Administered 2011-06-07: 0.5 mg via INTRAMUSCULAR
  Filled 2011-06-07: qty 1

## 2011-06-07 MED ORDER — HYDROMORPHONE HCL PF 1 MG/ML IJ SOLN
1.0000 mg | Freq: Once | INTRAMUSCULAR | Status: AC
  Administered 2011-06-07: 1 mg via INTRAVENOUS
  Filled 2011-06-07: qty 1

## 2011-06-07 NOTE — ED Notes (Signed)
Code Stroke Log- Monday Code stroke called- 1455 Pt arrival- 1510 EDP exam- 1510 Stroke team arrival- 1507 Last Seen Normal- 1315 Pt arrival in CT- 1512 Phlebotomist arrival- 1507 Code stroke canceled at 1529 reason was "not a stroke"

## 2011-06-07 NOTE — ED Notes (Signed)
MD made aware that pt states she is ready to leave.

## 2011-06-07 NOTE — ED Notes (Signed)
Per EMS- pt was at home when she began having difficultty speaking. Pts friend was last to see pt at 1315. When husband arrived home pt was having difficulty speaking. Pt took her dilaudid that her husband gave her this morning. ems states that husband controls her medications because she has a past history of substance abuse. Pt reports drinking "1 beer" today.

## 2011-06-07 NOTE — ED Provider Notes (Signed)
History     CSN: [id number]  Arrival date & time 06/07/11  1512   First MD Initiated Contact with Patient 06/07/11 1514      No chief complaint on file.   (Consider location/radiation/quality/duration/timing/severity/associated sxs/prior treatment) Patient is a 61 y.o. female presenting with neurologic complaint. The history is provided by the EMS personnel.  Neurologic Problem The primary symptoms include speech change, nausea and vomiting (twice, greenish appearing). Primary symptoms do not include headaches, dizziness or fever. Episode onset: last seen normal 2 hrs ago. Episode duration: unknown. The symptoms are unchanged. The neurological symptoms are focal. Context: spontaneously.  Change in speech began 1 - 3 hours ago. The speech change is unchanged. Features of the speech change include inability to articulate (slurring).  Associated symptoms comments: Abdominal pain . Medical issues also include seizures, alcohol use and drug use.    Past Medical History  Diagnosis Date  . Pancreatitis   . COPD (chronic obstructive pulmonary disease)   . Substance abuse   . Cancer     renal ca  . Seizures     Past Surgical History  Procedure Date  . Partial gastrectomy     No family history on file.  History  Substance Use Topics  . Smoking status: Current Everyday Smoker -- 0.5 packs/day    Types: Cigarettes  . Smokeless tobacco: Never Used  . Alcohol Use: Yes     Hx of Alcohol abuse, reports she "still has an occasional beer", but drinks on her medications    OB History    Grav Para Term Preterm Abortions TAB SAB Ect Mult Living                  Review of Systems  Constitutional: Negative for fever and fatigue.  HENT: Negative for congestion, drooling and neck pain.   Eyes: Negative for pain.  Respiratory: Positive for cough (mild). Negative for shortness of breath.   Cardiovascular: Negative for chest pain.  Gastrointestinal: Positive for nausea, vomiting (twice,  greenish appearing) and abdominal pain (since noon today). Negative for diarrhea.  Genitourinary: Negative for dysuria and hematuria.  Musculoskeletal: Negative for back pain and gait problem.  Skin: Negative for color change.  Neurological: Positive for speech change. Negative for dizziness and headaches.  Hematological: Negative for adenopathy.  Psychiatric/Behavioral: Negative for behavioral problems.  All other systems reviewed and are negative.    Allergies  Aspirin and Chlorpromazine hcl  Home Medications   Current Outpatient Rx  Name Route Sig Dispense Refill  . ALBUTEROL SULFATE HFA 108 (90 BASE) MCG/ACT IN AERS Inhalation Inhale 2 puffs into the lungs every 4 (four) hours as needed. For shortness of breath.    Marland Kitchen FLUTICASONE PROPIONATE  HFA 110 MCG/ACT IN AERO Inhalation Inhale 2 puffs into the lungs 2 (two) times daily. 1 Inhaler 0  . HYDROMORPHONE HCL 4 MG PO TABS Oral Take 1 tablet (4 mg total) by mouth every 6 (six) hours as needed. For pain. 20 tablet 0  . LORAZEPAM 1 MG PO TABS Oral Take 1 tablet (1 mg total) by mouth every 6 (six) hours as needed. For anxiety. 30 tablet 0  . ONDANSETRON HCL 4 MG PO TABS Oral Take 4 mg by mouth every 8 (eight) hours as needed. For nausea.    Marland Kitchen PROMETHAZINE HCL 25 MG RE SUPP Rectal Place 1 suppository (25 mg total) rectally every 6 (six) hours as needed for nausea. 12 each 0  . PROMETHAZINE HCL 25 MG PO TABS  Oral Take 1 tablet (25 mg total) by mouth every 8 (eight) hours as needed for nausea. 15 tablet 0    There were no vitals taken for this visit.  Physical Exam  Nursing note and vitals reviewed. Constitutional: She is oriented to person, place, and time. She appears well-developed and well-nourished.  HENT:  Head: Normocephalic.  Mouth/Throat: No oropharyngeal exudate.  Eyes: Conjunctivae and EOM are normal. Pupils are equal, round, and reactive to light.  Neck: Normal range of motion. Neck supple.  Cardiovascular: Normal rate,  regular rhythm, normal heart sounds and intact distal pulses.  Exam reveals no gallop and no friction rub.   No murmur heard. Pulmonary/Chest: Effort normal and breath sounds normal. No respiratory distress. She has no wheezes.  Abdominal: Soft. Bowel sounds are normal. There is tenderness (diffuse mild ttp, worse in epig area). There is no rebound and no guarding.  Musculoskeletal: Normal range of motion. She exhibits no edema and no tenderness.  Neurological: She is alert and oriented to person, place, and time. She has normal strength. No cranial nerve deficit or sensory deficit.       Pt slurring her speech on exam intermittently. She answers questions appropriately, has normal understanding. Pt has writhing movement of her mouth which are resolving during examination. No pronator drift noted on todays exam.  Skin: Skin is warm and dry.  Psychiatric: She has a normal mood and affect. Her behavior is normal.    ED Course  Procedures (including critical care time)   Labs Reviewed  PROTIME-INR  APTT  CBC  DIFFERENTIAL  COMPREHENSIVE METABOLIC PANEL  CK TOTAL AND CKMB  TROPONIN I  URINE RAPID DRUG SCREEN (HOSP PERFORMED)   No results found.   No diagnosis found.   Date: 06/07/2011  Rate: 101  Rhythm: sinus tachycardia  QRS Axis: normal  Intervals: normal  ST/T Wave abnormalities: normal  Conduction Disutrbances:none  Narrative Interpretation: Mild artifact, No ST or T wave changes cw ischemia  Old EKG Reviewed: changes noted    MDM  3:17 PM 61 y.o. female w hx of Etoh and subs abuse, seizures, pancreatitis, copd pw slurring of speech. Pt last seen normal at 1:15 pm today, when friend checked on her at 3pm she was found to be slurring her speech. Pt arrives as code stroke and went to CT scanner. Pt seen on 06/04/11 at [facility] for jaw/abdominal pain. At that time pt also c/o expressive aphasia which resolved spontaneously. No gross sensory or motor abnormalities on arrival  here today, but did have grossly slurred speech, but normal understanding.   3:30 PM Code stroke cancelled. Pt exhibits writhing movement suspicious for dyskinesia. Pt states sx started 1 hr ago, slurring and writhing movments now resolving on exam. Pt notes she has had 1 beer today, smells of Etoh on exam.   Labs non-contrib. Pt mentating well, ambulating w/out difficulty, has tolerated po. Unknown cause of pt's sx, but may be related to Etoh as pt had Etoh prior to previous event of slurring speech several days ago.  I have discussed the diagnosis/risks/treatment options with the patient and believe the pt to be eligible for discharge home to follow-up with pcp to discuss sx in 2-3 days. We also discussed returning to the ED immediately if new or worsening sx occur. We discussed the sx which are most concerning (e.g., slurring speech, weakness, ams) that necessitate immediate return. Any new prescriptions provided to the patient are listed below.  New Prescriptions   No  medications on file    Clinical Impression 1. Slurring of speech      Purvis Sheffield, MD 06/07/11 [phone]

## 2011-06-07 NOTE — ED Provider Notes (Signed)
I have seen and examined this patient with the resident.  I agree with the resident's note, assessment and plan except as indicated.    Patient presented as a code stroke initially.  Patient appear to have some slurred speech but on further evaluation she can speak clearly and without dysarthria.  The neurology team did complete initial evaluation and canceled the code stroke.  Notably on medical record reviewed patient has demonstrated problems with speaking previously.  Patient is complaining of abdominal pain which appears to be chronic related to patient's known pancreatitis, cirrhosis and persistent alcohol use.  We will evaluate the patient for further acute medical causes for her changes today and treat her pain at this time.  Nat Christen, MD 06/07/11 1630

## 2011-06-07 NOTE — ED Notes (Signed)
IV team at bedside 

## 2011-06-07 NOTE — Discharge Instructions (Signed)
Aphasia Aphasia is a neurological disorder caused by damage to the parts of the brain that control language. CAUSES  Aphasia is not a disease, but a symptom of brain damage. Aphasia is commonly seen in adults who have suffered a stroke. Aphasia also can result from:  A brain tumor.   Infection.   Head injury.   A rare type of dementia called Primary Progressive Aphasia. Common types of dementia may be associated with aphasia but can also exist without language problems.  SYMPTOMS  Primary signs of the disorder include:  Problems expressing oneself when speaking.   Trouble understanding speech.   Difficulty with reading and writing.   Speaking in short or incomplete sentences.   Speaking in sentences that don't make sense.   Speaking unrecognizable words.   Interpreting figurative language literally.   Writing sentences that don't make sense.  The type and severity of language problems depend on the precise location and extent of the damaged brain tissue. Aphasia can be divided into four broad categories:  Expressive aphasia - difficulty in conveying thoughts through speech or writing. The patient knows what they want to say, but cannot find the words they need.   Receptive aphasia - difficulty understanding spoken or written language. The patient hears the voice or sees the print but cannot make sense of the words.   Anomic or amnesia aphasia - difficulty in using the correct names for particular objects, people, places, or events. This is the least severe form of aphasia.   Global aphasia results from severe and extensive damage to the language areas of the brain. Patients lose almost all language function, both comprehension (understanding) and expression. They cannot speak, understand speech, read, or write.  TREATMENT  Sometimes an individual will completely recover from aphasia without treatment. In most cases, language therapy should begin as soon as possible. Language  therapy should be tailored to the individual needs of the patient. Therapy with a speech pathologist involves exercises in which patients:  Read.   Write.   Follow directions.   Repeat what they hear.   Computer-aided therapy may also be used.  PROGNOSIS  The outcome of aphasia is difficult to predict. People who are younger or have less extensive brain damage do better. The location of the injury is also important. The location is a clue to prognosis. In general, patients tend to recover skills in language comprehension (understanding) more completely than those skills involving expression (speaking or writing). Document Released: 09/12/2001 Document Revised: 12/10/2010 Document Reviewed: 11/08/2006 [facility] Patient Information 2012 [place], [place].  RESOURCE GUIDE  Chronic Pain Problems: Contact [facility]  [phone] Patients need to be referred by their primary care doctor.  Insufficient Money for Medicine: Contact United Way:  call "211" or [facility] 513-243-7559.  No Primary Care Doctor: - Call [facility]  [phone] - can help you locate a primary care doctor that  accepts your insurance, provides certain services, etc. - Physician Referral [phone]  Agencies that provide inexpensive medical care: - [facility]  [phone] - [facility]  [phone] - [facility]  [phone] [facility]  [phone] - [facility] Parenthood  [phone] [facility]  [phone]  Medicaid-accepting [facility] Providers: - [facility]- [address]  [phone], Mon-Fri 9am-7pm, Sat 9am-1pm - [facility]- [address] [place], [address] [place]  [phone] - [facility]- [phone] [place] [address], [address] [phone] [facility]- [address]  [phone] - Renaye Rakers- [address] [place], [address],  [phone]  Only accepts [place] patients after they have their name  applied to their card  Self Pay (no insurance) in [facility]: - Sickle Cell Patients: Dr Willey Blade, [facility]  [address] [place], [phone] - [facility]- [address] [place]  [phone]       [facility] [place]- [address]       -     [facility]- see information above (Speak to [employer] if you do not have insurance)       -  [facility]- [address] [place], [phone]       -  [facility]- [address],  [phone]       -  [facility]- [address] [phone]       -  Dr Julio Sicks-  [address], [place], [phone]       -  [facility]- [address] [phone]       -  [facility]- [address] [phone], also [address], [phone]       -    [facility]- [address] [place], [phone], 1st & 3rd Saturday   every month, 10am-1pm  1) Find a Doctor and Pay Out of Pocket Although you won't have to find out who is covered by your insurance plan, it is a good idea to ask around and get recommendations. You will then need to call the office and see if the doctor you have chosen will accept you as a new patient and what types of options they offer for patients who are self-pay. Some doctors offer discounts or will set up payment plans for their patients who do not have insurance, but you will need to ask so you aren't surprised when you get to your appointment.  2) Contact Your Local Health Department Not all health departments have doctors that can see patients for sick visits, but many do, so it is worth a call to see if yours does. If you don't know where your local health department is, you can check in your phone book. The CDC also has a tool to help you locate your state's health department, and many state websites also have  listings of all of their local health departments.  3) Find a Walk-in Clinic If your illness is not likely to be very severe or complicated, you may want to try a walk in clinic. These are popping up all over the country in pharmacies, drugstores, and shopping centers. They're usually staffed by nurse practitioners or physician assistants that have been trained to treat common illnesses and complaints. They're usually fairly quick and inexpensive. However, if you have serious medical issues or chronic medical problems, these are probably not your best option  STD Testing - [facility] Department of [facility] [place], STD Clinic, [address] [place], phone [phone] or [phone].  Monday - Friday, call for an appointment. [facility] Department of [employer], STD Clinic, [address], [place], phone [phone] or [phone].  Monday - Friday, call for an appointment.  Abuse/Neglect: [facility] Child Abuse Hotline (908)718-3243 -  [facility] Child Abuse Hotline 507-363-3813 (After Hours)  Emergency Shelter:  [facility] 7075940281  Maternity Homes: - Room at the [place] [facility] [phone] - [facility] (804)[phone]  MRSA Hotline #:   (216)779-2042  [facility] Resources  [facility] [place]  United [employer] [facility] Dept. [address]                 [address]         [address] [place] [address]  Blondell Reveal Phone:  [phone]                                  Phone:  [phone]                   Phone:  [phone]  [facility], [phone] - [phone]       -     [facility] in [place], [address],                                  [phone],  [facility] Child Abuse Hotline 615 209 6046 or 443-071-8031 (After Hours)   Behavioral Health Services  Substance Abuse Resources: - [facility]  [phone] - [facility] [phone] - [facility] [phone] Floydene Flock [phone] - Residential & Outpatient Substance Abuse Program  5401560967  Psychological Services: [facility]  [phone] Services  [phone] - [facility], [phone] [place]. [address], [place], ACCESS LINE: 979-128-1676 or (845)421-8354, [employer]  Dental Assistance  If unable to pay or uninsured, contact:  [facility] or [facility]. to become qualified for the adult dental clinic.  Patients with Medicaid: [facility] [phone] [address], [phone] [address] [phone]  If unable to pay, or uninsured, contact [facility] [phone]) [phone]) or [facility] Department [phone] in [place], [phone] in [facility]) to become qualified for the adult dental clinic  Other Low-Cost Community Dental Services: - [facility]- [address] [place], [place], [place], [address], [phone], Ext. [phone], 2nd and 4th Thursday of the month at 6:30am.  10 clients each day by appointment, can sometimes see walk-in patients if someone does not show for an appointment. [facility]- [address] [place], [place], [address], [phone] - [facility]- [address], [place], [place], [address], [phone] - [facility]- [phone] [facility] Department- [phone] [facility] Department- [phone]

## 2011-06-23 ENCOUNTER — Encounter (HOSPITAL_COMMUNITY): Payer: Self-pay | Admitting: Emergency Medicine

## 2011-06-23 ENCOUNTER — Emergency Department (HOSPITAL_COMMUNITY)
Admission: EM | Admit: 2011-06-23 | Discharge: 2011-06-23 | Disposition: A | Discharge status: Home / Self Care | Payer: Medicaid Other | Attending: Emergency Medicine | Admitting: Emergency Medicine

## 2011-06-23 DIAGNOSIS — J4489 Other specified chronic obstructive pulmonary disease: Secondary | ICD-10-CM | POA: Insufficient documentation

## 2011-06-23 DIAGNOSIS — Z8719 Personal history of other diseases of the digestive system: Secondary | ICD-10-CM | POA: Insufficient documentation

## 2011-06-23 DIAGNOSIS — R109 Unspecified abdominal pain: Secondary | ICD-10-CM | POA: Insufficient documentation

## 2011-06-23 DIAGNOSIS — G8929 Other chronic pain: Secondary | ICD-10-CM

## 2011-06-23 DIAGNOSIS — R1110 Vomiting, unspecified: Secondary | ICD-10-CM | POA: Insufficient documentation

## 2011-06-23 DIAGNOSIS — J449 Chronic obstructive pulmonary disease, unspecified: Secondary | ICD-10-CM | POA: Insufficient documentation

## 2011-06-23 DIAGNOSIS — Z79899 Other long term (current) drug therapy: Secondary | ICD-10-CM | POA: Insufficient documentation

## 2011-06-23 LAB — COMPREHENSIVE METABOLIC PANEL
ALT: 20 U/L (ref 0–35)
AST: 33 U/L (ref 0–37)
CO2: 26 mEq/L (ref 19–32)
Calcium: 10.3 mg/dL (ref 8.4–10.5)
GFR calc non Af Amer: >90 mL/min (ref >90)
Potassium: 4.1 mEq/L (ref 3.5–5.1)
Sodium: 135 mEq/L (ref 135–145)
Total Protein: 8.7 g/dL — ABNORMAL HIGH (ref 6.0–8.3)

## 2011-06-23 LAB — DIFFERENTIAL
Basophils Absolute: 0 10*3/uL (ref 0.0–0.1)
Eosinophils Relative: 0 % (ref 0–5)
Lymphocytes Relative: 17 % (ref 12–46)
Neutrophils Relative %: 77 % (ref 43–77)

## 2011-06-23 LAB — CBC
MCV: 91.1 fL (ref 78.0–100.0)
Platelets: 168 10*3/uL (ref 150–400)
RDW: 14.2 % (ref 11.5–15.5)
WBC: 4.2 10*3/uL (ref 4.0–10.5)

## 2011-06-23 MED ORDER — ONDANSETRON 4 MG PO TBDP
4.0000 mg | ORAL_TABLET | Freq: Once | ORAL | Status: AC
  Administered 2011-06-23: 4 mg via ORAL
  Filled 2011-06-23: qty 1

## 2011-06-23 MED ORDER — HYDROMORPHONE HCL PF 1 MG/ML IJ SOLN
1.0000 mg | Freq: Once | INTRAMUSCULAR | Status: AC
  Administered 2011-06-23: 1 mg via INTRAVENOUS
  Filled 2011-06-23: qty 1

## 2011-06-23 MED ORDER — HYDROMORPHONE HCL PF 1 MG/ML IJ SOLN: 1.0000 mg | Freq: Once | INTRAMUSCULAR | Status: DC

## 2011-06-23 MED ORDER — LORAZEPAM 1 MG PO TABS
1.0000 mg | ORAL_TABLET | Freq: Once | ORAL | Status: AC
  Administered 2011-06-23: 1 mg via ORAL
  Filled 2011-06-23: qty 1

## 2011-06-23 MED ORDER — HYDROMORPHONE HCL PF 2 MG/ML IJ SOLN
2.0000 mg | Freq: Once | INTRAMUSCULAR | Status: AC
  Administered 2011-06-23: 2 mg via INTRAMUSCULAR
  Filled 2011-06-23: qty 1

## 2011-06-23 MED ORDER — ONDANSETRON HCL 4 MG/2ML IJ SOLN
4.0000 mg | Freq: Once | INTRAMUSCULAR | Status: AC
  Administered 2011-06-23: 4 mg via INTRAVENOUS
  Filled 2011-06-23: qty 2

## 2011-06-23 MED ORDER — ONDANSETRON HCL 4 MG/2ML IJ SOLN
INTRAMUSCULAR | Status: AC
  Filled 2011-06-23: qty 2

## 2011-06-23 NOTE — Discharge Instructions (Signed)

## 2011-06-23 NOTE — ED Provider Notes (Signed)
History     CSN: [id number]  Arrival date & time 06/23/11  4098   First MD Initiated Contact with Patient 06/23/11 [phone]      Chief Complaint  Patient presents with  . Abdominal Pain    (Consider location/radiation/quality/duration/timing/severity/associated sxs/prior treatment) HPI Pt with long history of chronic abdominal pain, pancreatitis and substance abuse with numerous ED visits for same reports 2 days of severe diffuse cramping abdominal pain, associated with vomiting, no fever or diarrhea. Similar to previous. Unable to keep down meds at home. Reports she ran out of her pain medications 2 weeks early. Scheduled to see PCP on July 1 for refill.   Past Medical History  Diagnosis Date  . Pancreatitis   . COPD (chronic obstructive pulmonary disease)   . Substance abuse   . Cancer     renal ca  . Seizures   . Pancreatitis     Past Surgical History  Procedure Date  . Partial gastrectomy     No family history on file.  History  Substance Use Topics  . Smoking status: Current Everyday Smoker -- 0.5 packs/day    Types: Cigarettes  . Smokeless tobacco: Never Used  . Alcohol Use: Yes     Hx of Alcohol abuse, reports she "still has an occasional beer", but drinks on her medications    OB History    Grav Para Term Preterm Abortions TAB SAB Ect Mult Living                  Review of Systems All other systems reviewed and are negative except as noted in HPI.   Allergies  Aspirin and Chlorpromazine hcl  Home Medications   Current Outpatient Rx  Name Route Sig Dispense Refill  . ALBUTEROL SULFATE HFA 108 (90 BASE) MCG/ACT IN AERS Inhalation Inhale 2 puffs into the lungs every 4 (four) hours as needed. For shortness of breath.    Marland Kitchen FLUTICASONE PROPIONATE  HFA 110 MCG/ACT IN AERO Inhalation Inhale 2 puffs into the lungs 2 (two) times daily. 1 Inhaler 0  . HYDROMORPHONE HCL 4 MG PO TABS Oral Take 1 tablet (4 mg total) by mouth every 6 (six) hours as needed. For  pain. 20 tablet 0  . LORAZEPAM 1 MG PO TABS Oral Take 1 tablet (1 mg total) by mouth every 6 (six) hours as needed. For anxiety. 30 tablet 0  . ONDANSETRON HCL 4 MG PO TABS Oral Take 4 mg by mouth every 8 (eight) hours as needed. For nausea.    Marland Kitchen PROMETHAZINE HCL 25 MG RE SUPP Rectal Place 1 suppository (25 mg total) rectally every 6 (six) hours as needed for nausea. 12 each 0  . PROMETHAZINE HCL 25 MG PO TABS Oral Take 1 tablet (25 mg total) by mouth every 8 (eight) hours as needed for nausea. 15 tablet 0    BP 167/149  Temp 97.9 F (36.6 C) (Oral)  Resp 22  SpO2 99%  Physical Exam  Nursing note and vitals reviewed. Constitutional: She is oriented to person, place, and time. She appears well-developed and well-nourished.  HENT:  Head: Normocephalic and atraumatic.       Membranes dry  Eyes: EOM are normal. Pupils are equal, round, and reactive to light.  Neck: Normal range of motion. Neck supple.  Cardiovascular: Normal rate, normal heart sounds and intact distal pulses.   Pulmonary/Chest: Effort normal and breath sounds normal.  Abdominal: Soft. Bowel sounds are normal. She exhibits no distension. There is tenderness (  diffuse tenderness). There is no rebound and no guarding.  Musculoskeletal: Normal range of motion. She exhibits no edema and no tenderness.  Neurological: She is alert and oriented to person, place, and time. She has normal strength. No cranial nerve deficit or sensory deficit.  Skin: Skin is warm and dry. No rash noted.  Psychiatric: She has a normal mood and affect.    ED Course  Procedures (including critical care time)  Labs Reviewed  CBC - Abnormal; Notable for the following:    Hemoglobin 15.3 (*)     All other components within normal limits  COMPREHENSIVE METABOLIC PANEL - Abnormal; Notable for the following:    Chloride 95 (*)     Glucose, Bld 104 (*)     Total Protein 8.7 (*)     Alkaline Phosphatase 124 (*)     All other components within normal  limits  DIFFERENTIAL  LIPASE, BLOOD   No results found.   No diagnosis found.    MDM  Labs unremarkable. Pain improved. Pt tolerating PO fluids. Advised she would not get narcotic Rx from ED and she needed to followup with her PCP.         Rainn Bullinger B. Bernette Mayers, MD 06/23/11 1212

## 2011-06-23 NOTE — ED Notes (Signed)
EMS was called to home. Pt was on couch, husband opened door. Abdominal pain for past 2 days, n/v, no diarrhea. Pain is sharp 10/10. Hx of pancreatitis.

## 2011-06-23 NOTE — ED Notes (Signed)
Dilauded 4 mg QID taken and last dose on Sunday. Prescription ordered on 05/26/11. Pt had alcohol to drink, states she had last drink on Sunday. EMS states that pt smelled like alcohol.

## 2011-06-23 NOTE — ED Notes (Signed)
[id number]<QM> Expected date:06/23/11<BR> Expected time: 8:42 AM<BR> Means of arrival:Ambulance<BR> Comments:<BR> N,v, pancreatitis

## 2011-06-23 NOTE — ED Notes (Signed)
IV team at bedside 

## 2011-06-24 IMAGING — CR DG ABDOMEN ACUTE W/ 1V CHEST
4 series · 4 of 4 positions shown · non-contrast
Comparison: 11/14/2008

CLINICAL DATA: Abdominal pain, vomiting

ACUTE ABDOMEN SERIES (ABDOMEN 2 VIEW & CHEST 1 VIEW)

[w chest pa]
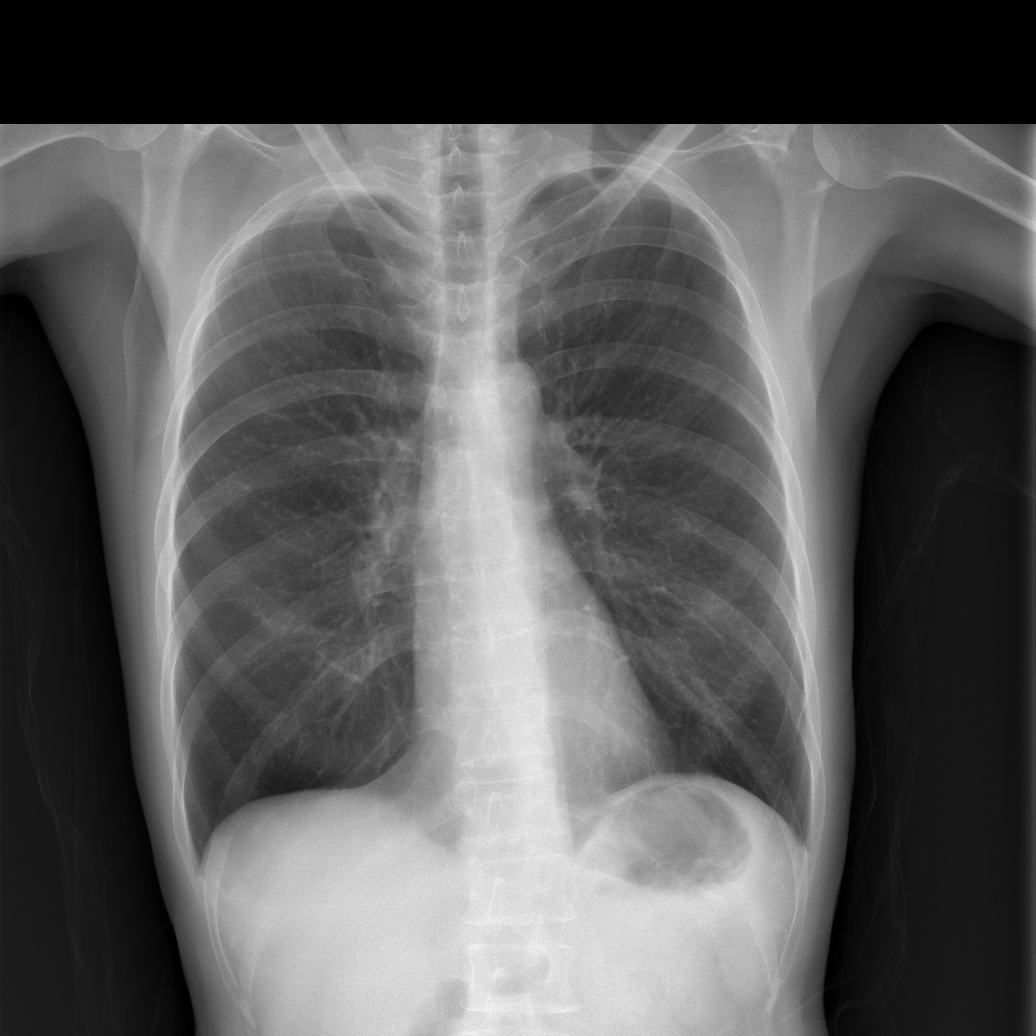

[w abdomen upright *]
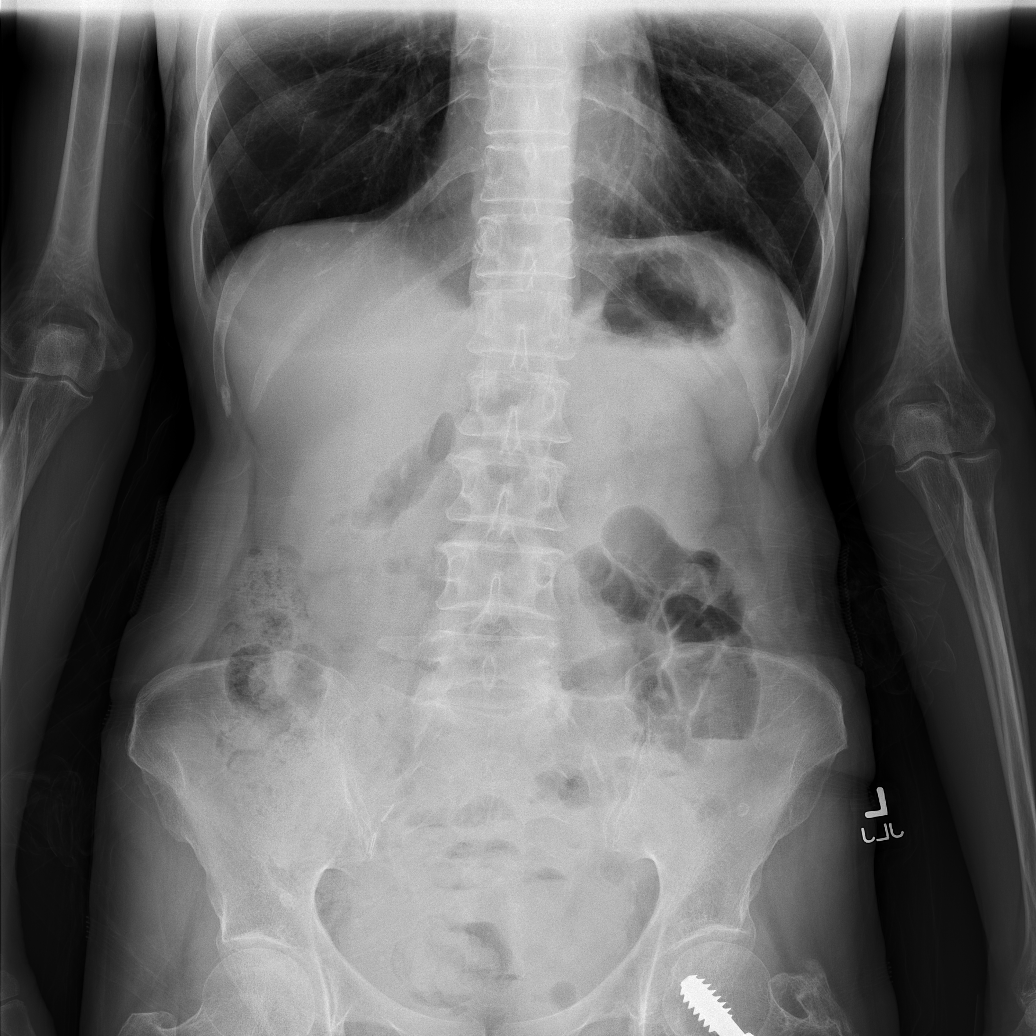

[t abdomen supine (1 of 2)]
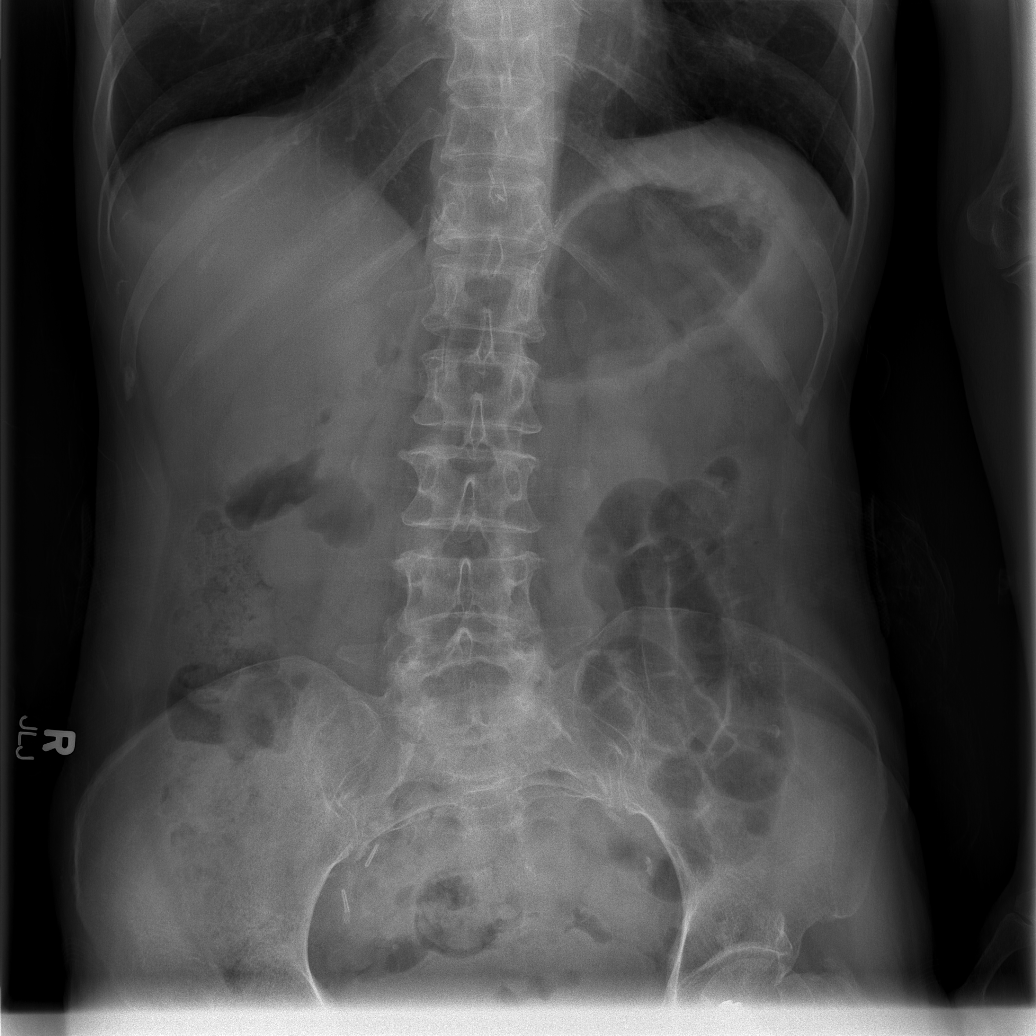

[t abdomen supine (2 of 2)]
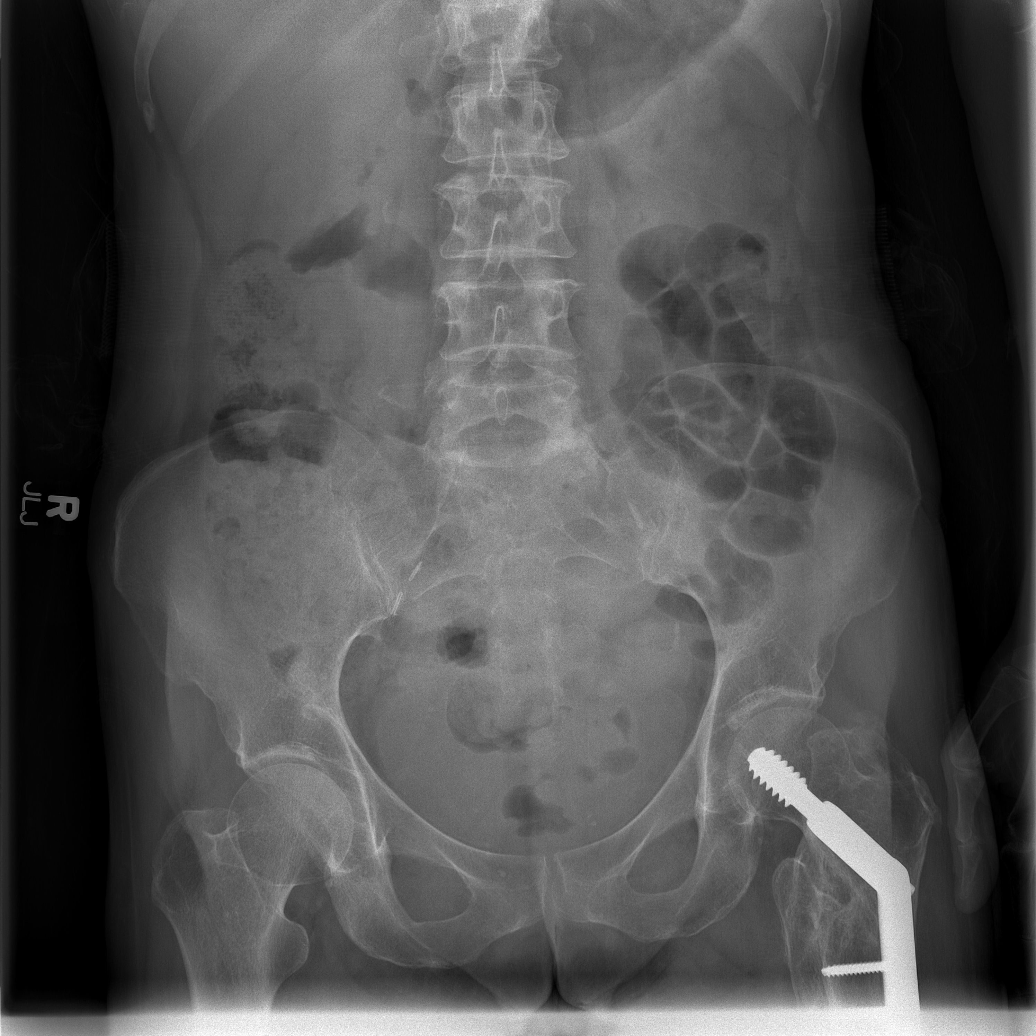

[4 of 4 positions shown; findings below may reference images not displayed]

FINDINGS: Cardiomediastinal silhouette is stable.  Hyperinflation
again noted.  No acute infiltrate or pleural effusion.  No
pulmonary edema.

Mild distended small bowel loops with some air fluid levels are
noted in the left abdomen.  These are suspicious for early bowel
obstruction or ileus.
IMPRESSION: No acute disease within chest.  Hyperinflation again noted.  Mild
distended small bowel loops in left lateral is some air fluid
levels suspicious for early bowel obstruction or ileus.

## 2011-06-24 IMAGING — CT CT PELVIS W/O CM
1 of 2 series · 12 of 32 positions shown, 18 images · non-contrast
Comparison: 03/20/2008 and earlier.

CT ABDOMEN

CLINICAL DATA: 57-year-old female with abdominal pain.  History of
left renal cell carcinoma in 1999 status post RFA.  Prior partial
gastrectomy.  Prior pancreatitis.

CT ABDOMEN AND PELVIS WITHOUT CONTRAST
TECHNIQUE: Multidetector CT imaging of the abdomen and pelvis was
performed following the standard protocol without intravenous
contrast.

[Series 2: rtn ap without · axial · non-contrast · 0.59mm/px · z∈[-324,+56]mm · 12 of 88 slices shown, 18 images]
[im 6/88  soft-tissue]
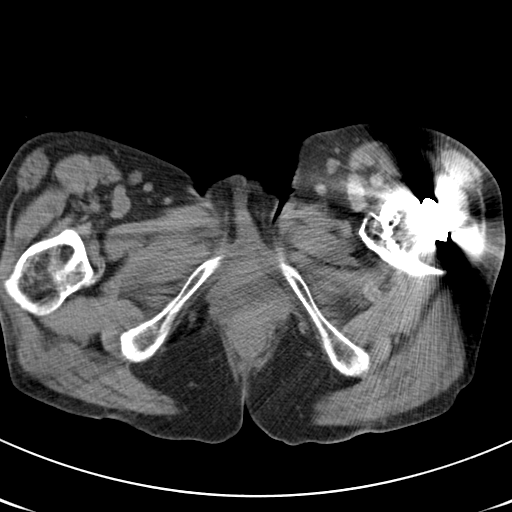
[im 6/88  bone]
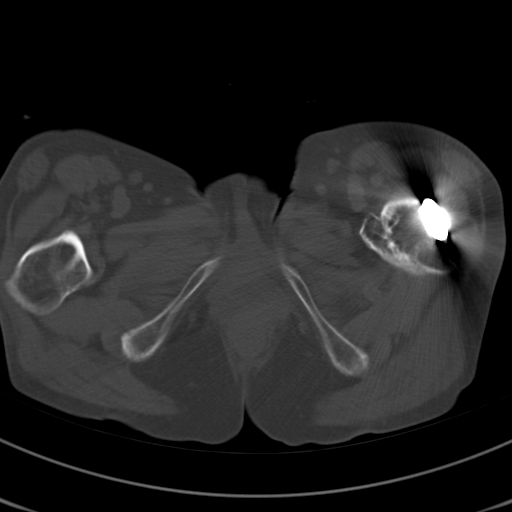
[im 18/88  soft-tissue]
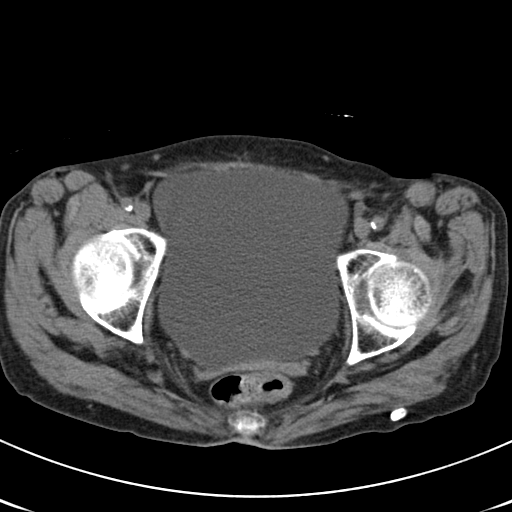
[im 24/88  soft-tissue]
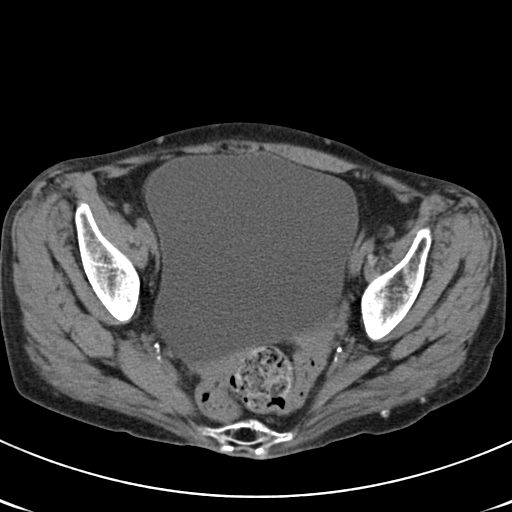
[im 30/88  soft-tissue]
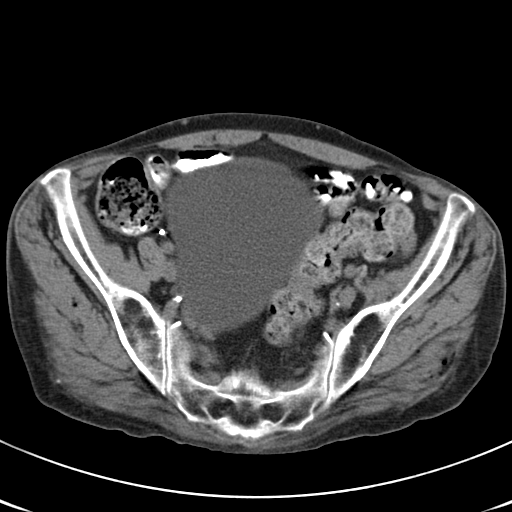
[im 35/88  soft-tissue]
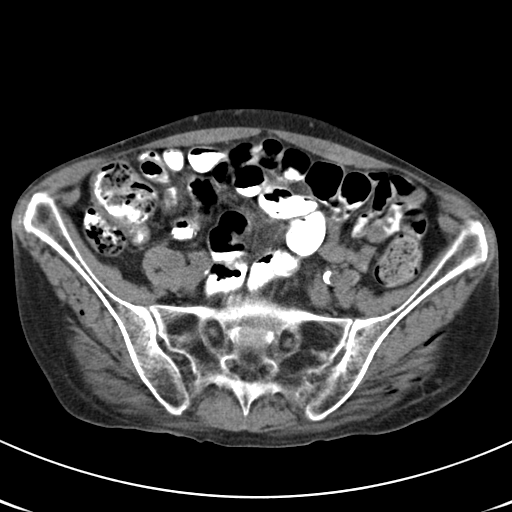
[im 41/88  soft-tissue]
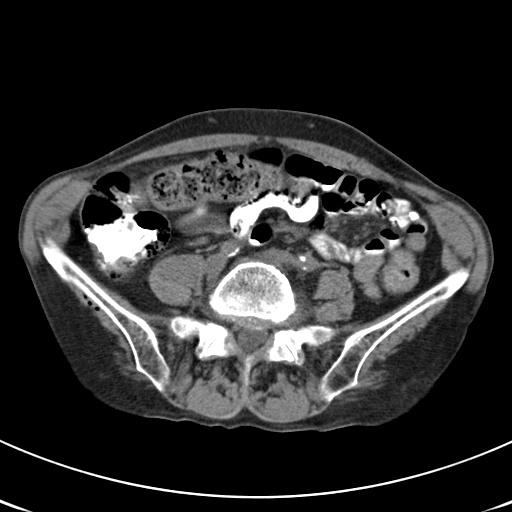
[im 53/88  soft-tissue]
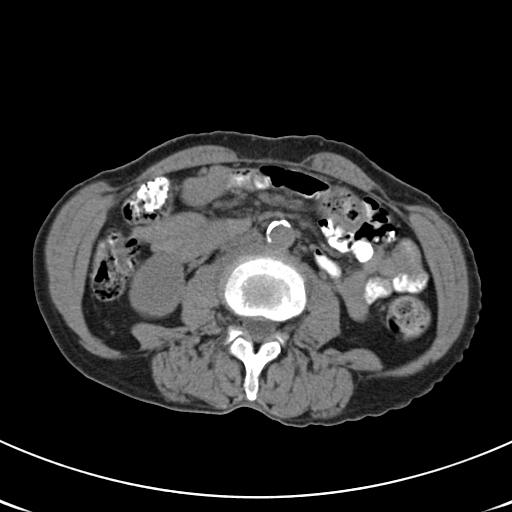
[im 59/88  soft-tissue]
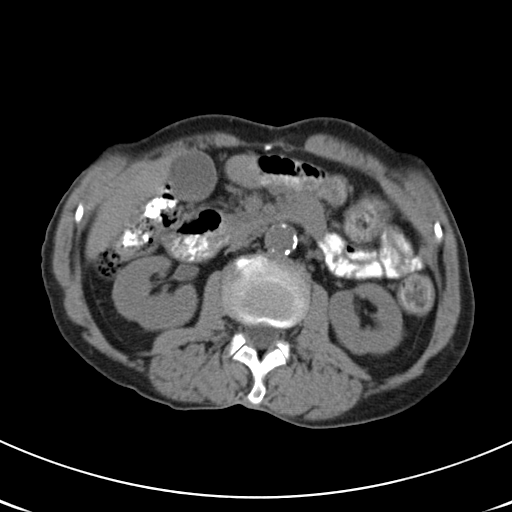
[im 64/88  soft-tissue]
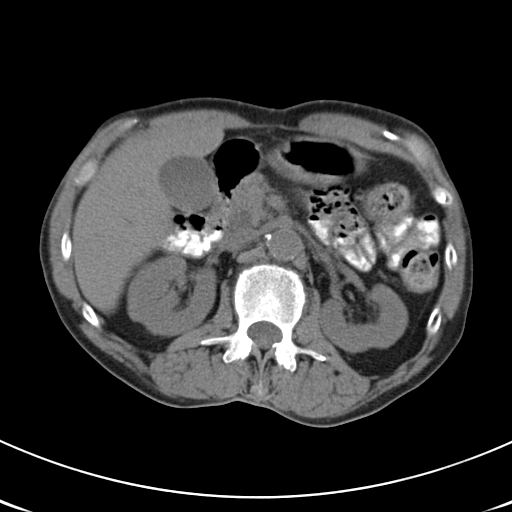
[im 64/88  lung]
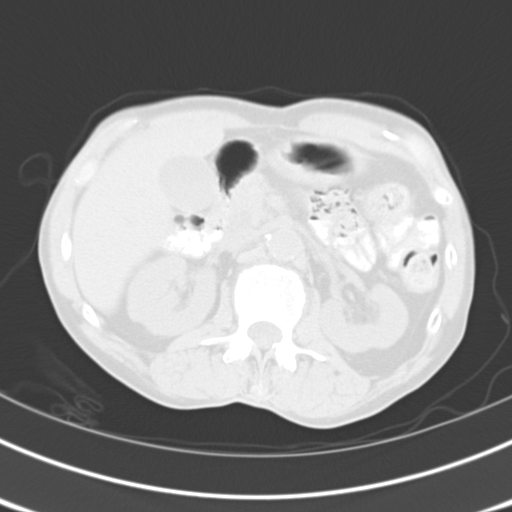
[im 64/88  bone]
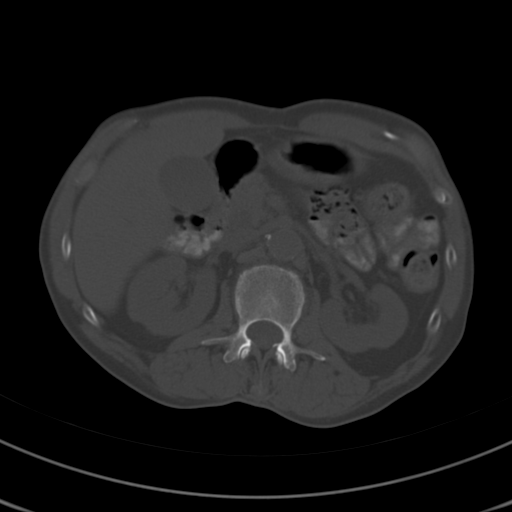
[im 70/88  soft-tissue]
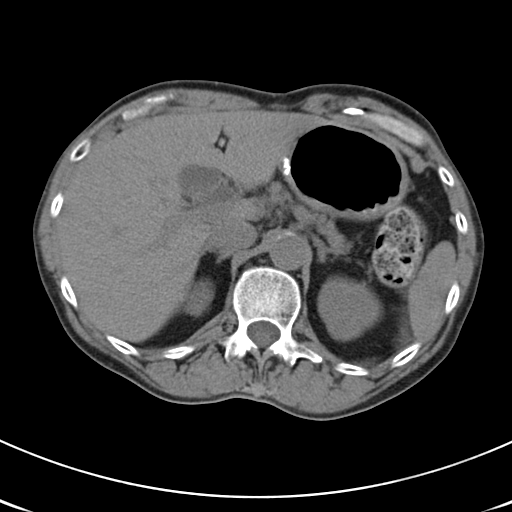
[im 70/88  lung]
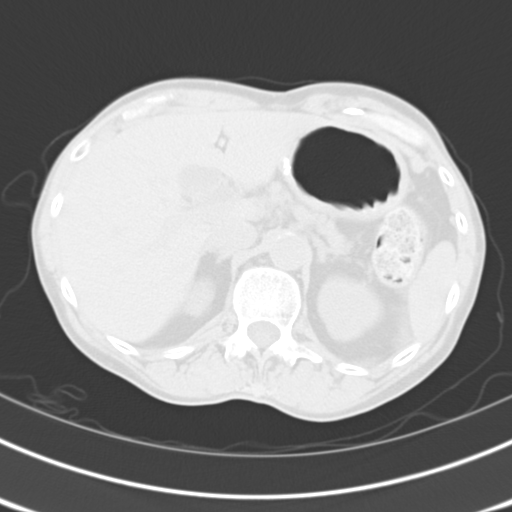
[im 76/88  soft-tissue]
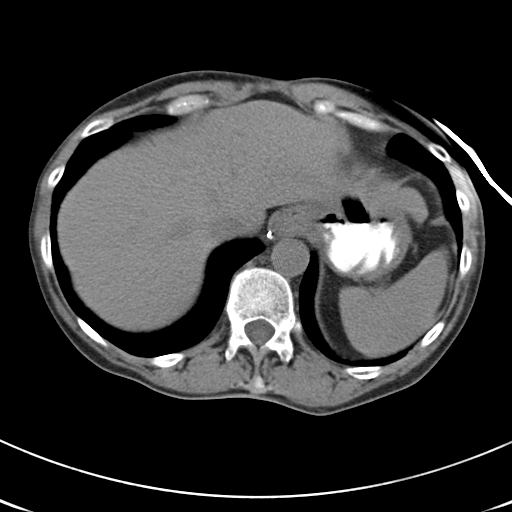
[im 76/88  lung]
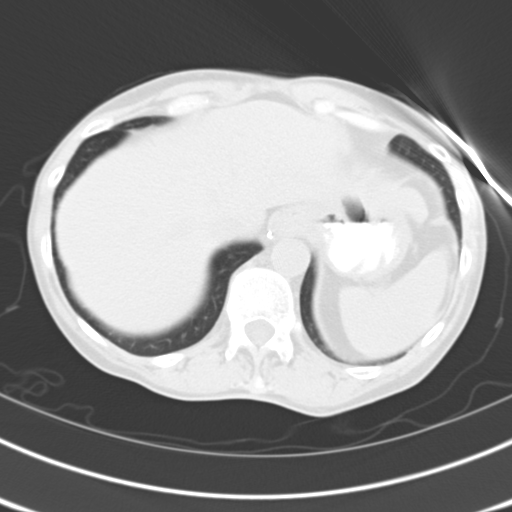
[im 82/88  soft-tissue]
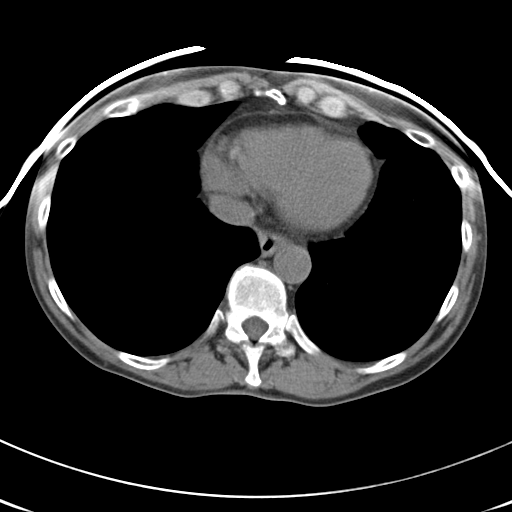
[im 82/88  lung]
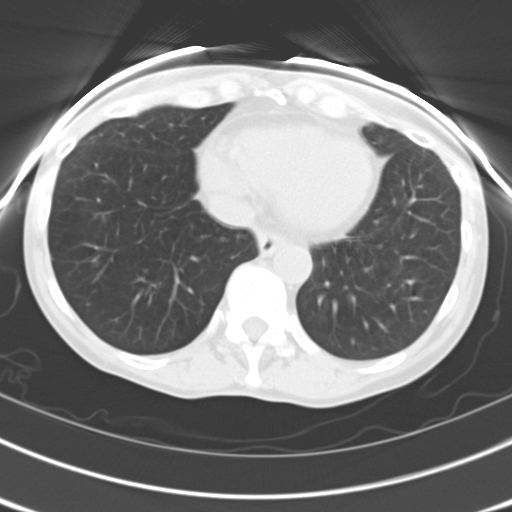

[12 of 32 positions shown; findings below may reference images not displayed]

FINDINGS: Visualized lung bases are clear.  Stable disc
degeneration in the spine.  No acute or suspicious osseous lesion
identified.  Oral contrast was administered.  This has reached the
hepatic flexure.  There is retained stool throughout the colon.
Noncontrast liver, gallbladder, spleen, pancreas, adrenal glands,
and right kidney are within normal limits.  The left kidney is
again remarkable for a defect at the medial lower pole which on
these noncontrasted images is partially hyperdense and partially
cystic.  This measures 19 x 14 x 12 mm (stable), and was better
delineated on the prior contrasted exam.  No retroperitoneal or
abdominal lymphadenopathy.  Calcified atherosclerosis.  The
stomach, duodenum and visualized small bowel loops are within
normal limits.
IMPRESSION: 1.  No acute findings identified in the abdomen.
2.  Stable noncontrasted appearance of the post RFA defect at the
left renal lower pole.
3.  There is retained stool throughout colon.

CT PELVIS
FINDINGS: The bladder is distended, measuring 12 x 12 x 13 cm.  No
pelvic free fluid.  Stool in the distal colon.  Visualized distal
small bowel loops are within normal limits.  Calcified
atherosclerosis.  Postoperative changes at the left proximal femur
with hardware in place.  No acute or suspicious osseous lesion is
identified.  There is a small amount of gas in the right gluteus
musculature without associated inflammatory changes or fluid.  A
similar appearing gas is seen within the planes of the left gluteal
musculature.  There are multiple chronic-appearing injection
granulomas in the region.
IMPRESSION: 1.  Distended bladder.  Correlate for urinary tract infection or
bladder outlet obstruction.
2.  Small volume of gas tracking within the bilateral gluteal
musculature, favor related to recent intramuscular injection.  No
associated findings to suggest myositis. Clinical correlation
recommended.

## 2011-07-09 IMAGING — CR DG SACRUM/COCCYX 2+V
3 series · 3 of 3 positions shown · non-contrast
Comparison: Sagittal CT images [id number]

CLINICAL DATA: Tail bone pain status post fall

SACRUM AND COCCYX - 2+ VIEW

[t sacrum a.p.]
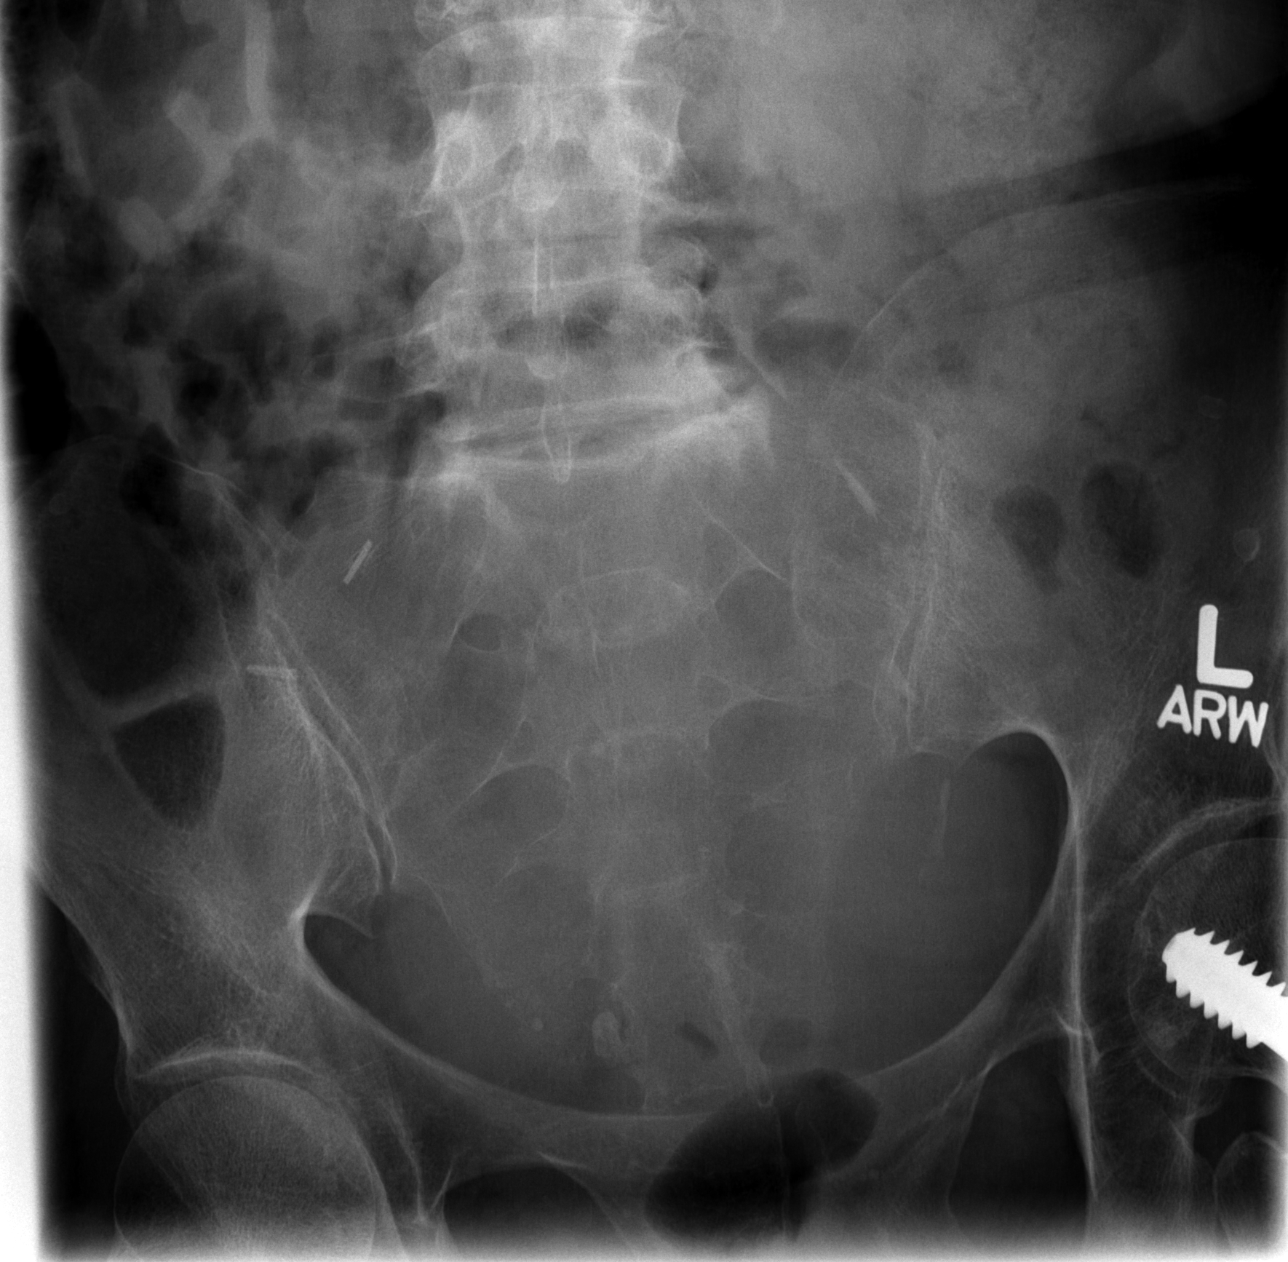

[t coccyx a.p.]
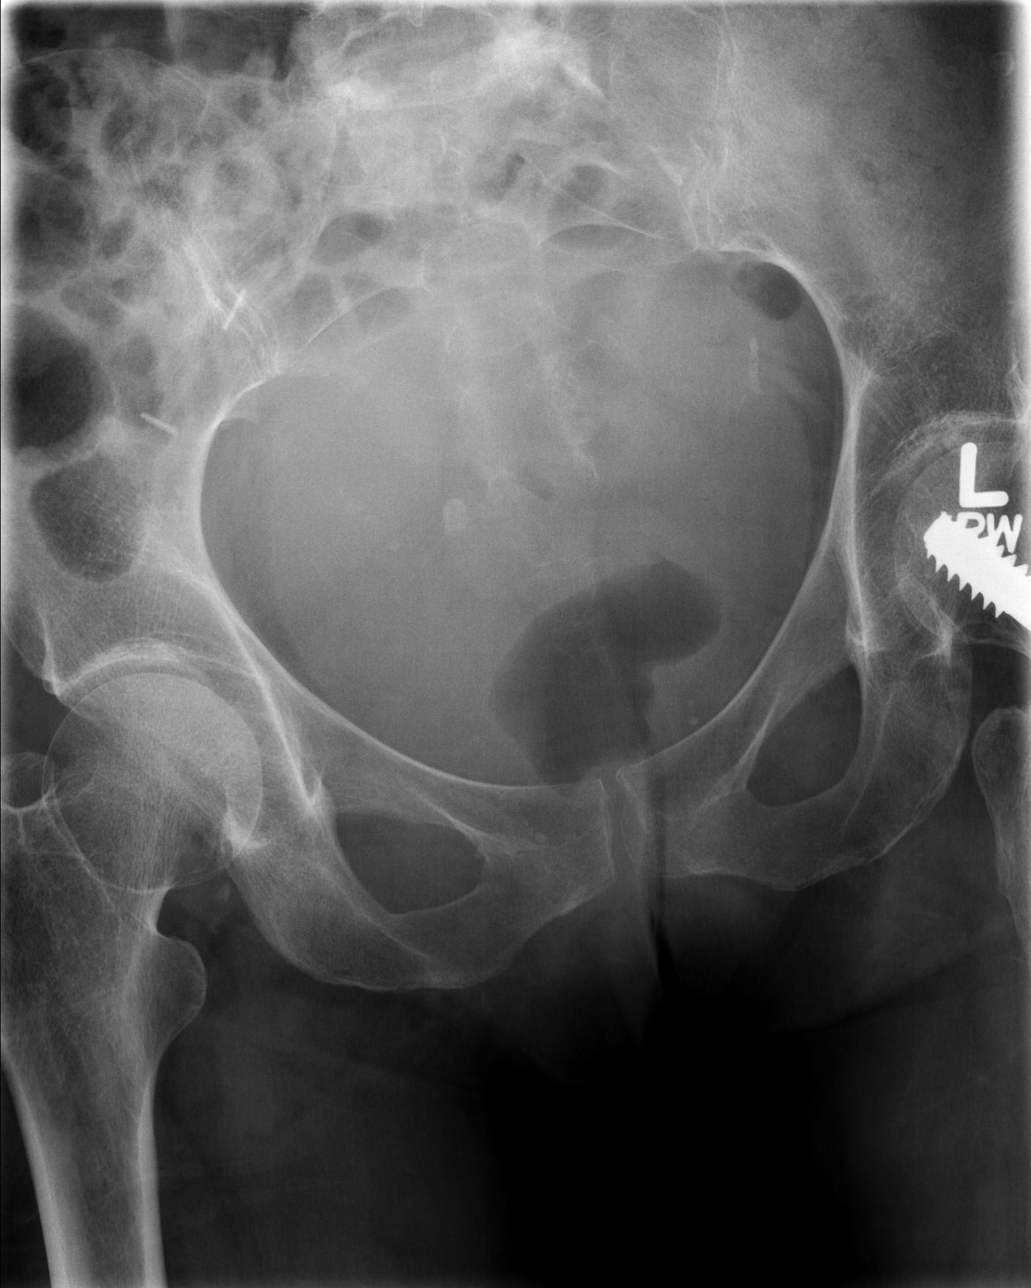

[t sacrum lat]
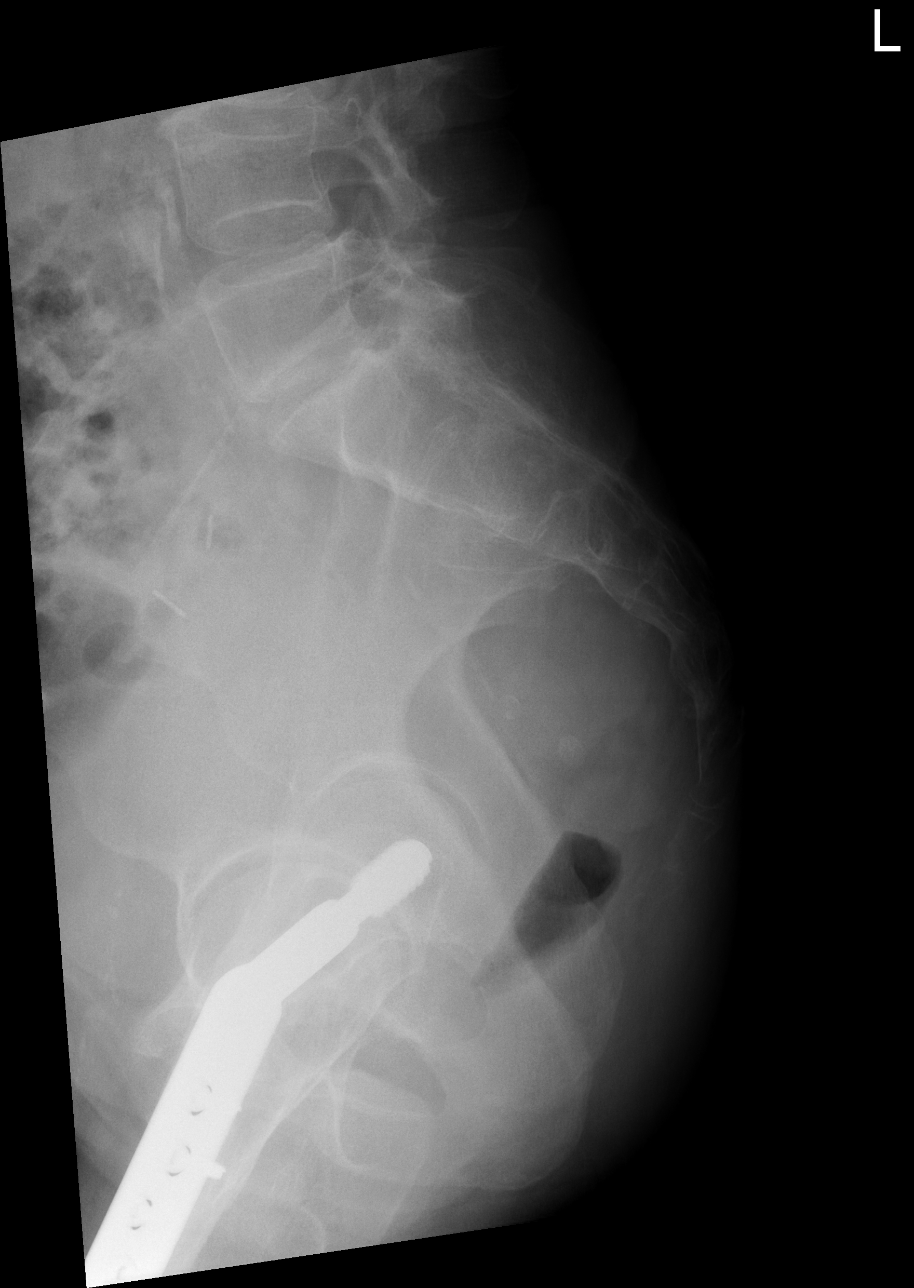

[3 of 3 positions shown; findings below may reference images not displayed]

FINDINGS: Diffuse bony demineralization.
SI joints symmetric.
Minimal scattered vascular calcification.
Orthopedic hardware proximal left femur.
Sacral neural foramina symmetric.
Scattered pelvic phleboliths.
No definite sacrococcygeal fracture identified.
IMPRESSION: No acute bony abnormalities.

## 2011-07-14 ENCOUNTER — Encounter (HOSPITAL_COMMUNITY): Payer: Self-pay | Admitting: Emergency Medicine

## 2011-07-14 ENCOUNTER — Emergency Department (HOSPITAL_COMMUNITY)
Admission: EM | Admit: 2011-07-14 | Discharge: 2011-07-14 | Disposition: A | Discharge status: Home / Self Care | Payer: Medicaid Other | Attending: Emergency Medicine | Admitting: Emergency Medicine

## 2011-07-14 DIAGNOSIS — R112 Nausea with vomiting, unspecified: Secondary | ICD-10-CM | POA: Insufficient documentation

## 2011-07-14 DIAGNOSIS — Z79899 Other long term (current) drug therapy: Secondary | ICD-10-CM | POA: Insufficient documentation

## 2011-07-14 DIAGNOSIS — Z85528 Personal history of other malignant neoplasm of kidney: Secondary | ICD-10-CM | POA: Insufficient documentation

## 2011-07-14 DIAGNOSIS — J4489 Other specified chronic obstructive pulmonary disease: Secondary | ICD-10-CM | POA: Insufficient documentation

## 2011-07-14 DIAGNOSIS — F17200 Nicotine dependence, unspecified, uncomplicated: Secondary | ICD-10-CM | POA: Insufficient documentation

## 2011-07-14 DIAGNOSIS — J449 Chronic obstructive pulmonary disease, unspecified: Secondary | ICD-10-CM | POA: Insufficient documentation

## 2011-07-14 DIAGNOSIS — R1013 Epigastric pain: Secondary | ICD-10-CM | POA: Insufficient documentation

## 2011-07-14 LAB — CBC WITH DIFFERENTIAL/PLATELET
Eosinophils Absolute: 0 10*3/uL (ref 0.0–0.7)
Eosinophils Relative: 0 % (ref 0–5)
Hemoglobin: 14.4 g/dL (ref 12.0–15.0)
Lymphs Abs: 0.8 10*3/uL (ref 0.7–4.0)
MCH: 31.2 pg (ref 26.0–34.0)
MCV: 90.9 fL (ref 78.0–100.0)
Monocytes Absolute: 0.5 10*3/uL (ref 0.1–1.0)
Monocytes Relative: 7 % (ref 3–12)
RBC: 4.61 MIL/uL (ref 3.87–5.11)

## 2011-07-14 LAB — URINE MICROSCOPIC-ADD ON

## 2011-07-14 LAB — BASIC METABOLIC PANEL
BUN: 7 mg/dL (ref 6–23)
Calcium: 10 mg/dL (ref 8.4–10.5)
GFR calc non Af Amer: >90 mL/min (ref >90)
Glucose, Bld: 75 mg/dL (ref 70–99)

## 2011-07-14 LAB — HEPATIC FUNCTION PANEL
ALT: 25 U/L (ref 0–35)
Total Protein: 8.3 g/dL (ref 6.0–8.3)

## 2011-07-14 LAB — URINALYSIS, ROUTINE W REFLEX MICROSCOPIC
Bilirubin Urine: NEGATIVE
Protein, ur: NEGATIVE mg/dL
Specific Gravity, Urine: 1.008 (ref 1.005–1.030)
Urobilinogen, UA: 0.2 mg/dL (ref 0.0–1.0)

## 2011-07-14 LAB — LIPASE, BLOOD: Lipase: 33 U/L (ref 11–59)

## 2011-07-14 MED ORDER — METOCLOPRAMIDE HCL 5 MG/ML IJ SOLN
10.0000 mg | Freq: Once | INTRAMUSCULAR | Status: AC
  Administered 2011-07-14: 10 mg via INTRAMUSCULAR
  Filled 2011-07-14: qty 2

## 2011-07-14 MED ORDER — HYDROMORPHONE HCL PF 1 MG/ML IJ SOLN
1.0000 mg | Freq: Once | INTRAMUSCULAR | Status: AC
  Administered 2011-07-14: 1 mg via INTRAMUSCULAR
  Filled 2011-07-14: qty 1

## 2011-07-14 MED ORDER — HYDROMORPHONE HCL PF 1 MG/ML IJ SOLN
1.0000 mg | Freq: Once | INTRAMUSCULAR | Status: AC
  Administered 2011-07-14: 1 mg via INTRAVENOUS
  Filled 2011-07-14: qty 1

## 2011-07-14 MED ORDER — ONDANSETRON 8 MG PO TBDP
8.0000 mg | ORAL_TABLET | Freq: Once | ORAL | Status: DC
  Filled 2011-07-14: qty 1

## 2011-07-14 NOTE — ED Notes (Signed)
PA at bedside.

## 2011-07-14 NOTE — ED Notes (Signed)
PER EMS- pt reports abdominal pain, nausea, and vomiting,  from pancreatitis 9/10 pain x1 day.  Picked up from home.  Hx of COPD, kidney cancer in remission.

## 2011-07-14 NOTE — ED Notes (Signed)
IV team paged.  

## 2011-07-14 NOTE — ED Notes (Signed)
ZOX:[id number]<AV> Expected date:07/14/11<BR> Expected time:12:13 PM<BR> Means of arrival:Ambulance<BR> Comments:<BR> Pancreatitis

## 2011-07-14 NOTE — ED Notes (Signed)
Pt ambulatory out to nurses station states she needs to see the doctor. Pt informed that a physician will be in to see her shortly, but there has been a delay due to pt volume at this time.  Pt states "well I'm going to have to leave soon."  Ambulatory back into pt room. NAD noted at this time.

## 2011-07-14 NOTE — ED Provider Notes (Signed)
History     CSN: [id number]  Arrival date & time 07/14/11  1220   First MD Initiated Contact with Patient 07/14/11 1410      Chief Complaint  Patient presents with  . Abdominal Pain    (Consider location/radiation/quality/duration/timing/severity/associated sxs/prior treatment) HPI Comments: [name] 61 y.o. female   The chief complaint is: Patient presents with:   Abdominal Pain   The patient has medical history significant for:   Past Medical History:   Pancreatitis                                                 COPD (chronic obstructive pulmonary disease)                 Substance abuse                                              Cancer                                                         Comment:renal ca   Seizures                                                     Pancreatitis                                                Abdominal Pain: Patient with a history of chronic pancreatitis, GI surgery for gastric ulcers, and appendectomy who complains of abdominal pain that started last night. The pain is described as sharp, and is 10/10 in intensity. Pain is located in the epigastric region of the abdomem with no radiation or transmission. Symptoms have been worsening and are similar to her pain when she is having an acute pancreatitis flare. Aggravating factors: include eating.  Alleviating factors: dilaudid. Associated symptoms: include nausea and 5 episodes of bilious vomiting.The patient denies fever or chills. Denies CP or SOB. Patient tolerates liquids well but not solids. Bowel habits unchanged.         Patient is a 61 y.o. female presenting with abdominal pain.  Abdominal Pain The primary symptoms of the illness include abdominal pain, nausea and vomiting. The primary symptoms of the illness do not include fever, shortness of breath or diarrhea.  Symptoms associated with the illness do not include chills.    Past Medical History  Diagnosis Date    . Pancreatitis   . COPD (chronic obstructive pulmonary disease)   . Substance abuse   . Cancer     renal ca  . Seizures   . Pancreatitis     Past Surgical History  Procedure Date  . Partial gastrectomy     No family history on file.  History  Substance Use Topics  .  Smoking status: Current Everyday Smoker -- 0.5 packs/day    Types: Cigarettes  . Smokeless tobacco: Never Used  . Alcohol Use: Yes     Hx of Alcohol abuse, reports she "still has an occasional beer", but drinks on her medications    OB History    Grav Para Term Preterm Abortions TAB SAB Ect Mult Living                  Review of Systems  Constitutional: Positive for appetite change. Negative for fever and chills.  Respiratory: Negative for shortness of breath.   Cardiovascular: Negative for chest pain.  Gastrointestinal: Positive for nausea, vomiting and abdominal pain. Negative for diarrhea and blood in stool.       No hematochezia or melena    Allergies  Aspirin and Chlorpromazine hcl  Home Medications   Current Outpatient Rx  Name Route Sig Dispense Refill  . ALBUTEROL SULFATE HFA 108 (90 BASE) MCG/ACT IN AERS Inhalation Inhale 2 puffs into the lungs every 4 (four) hours as needed. For shortness of breath.    Marland Kitchen FLUTICASONE PROPIONATE  HFA 110 MCG/ACT IN AERO Inhalation Inhale 2 puffs into the lungs 2 (two) times daily. 1 Inhaler 0  . HYDROMORPHONE HCL 4 MG PO TABS Oral Take 1 tablet (4 mg total) by mouth every 6 (six) hours as needed. For pain. 20 tablet 0  . LORAZEPAM 1 MG PO TABS Oral Take 1 tablet (1 mg total) by mouth every 6 (six) hours as needed. For anxiety. 30 tablet 0  . ONDANSETRON HCL 4 MG PO TABS Oral Take 4 mg by mouth every 8 (eight) hours as needed. For nausea.      BP 150/94  Pulse 93  Temp 98.5 F (36.9 C)  Resp 16  Wt 88 lb (39.917 kg)  SpO2 97%  Physical Exam  Constitutional: She appears well-developed and well-nourished.  Eyes: No scleral icterus.  Cardiovascular:  Normal rate, regular rhythm and normal heart sounds.   Pulmonary/Chest: Effort normal and breath sounds normal.  Abdominal: Soft. Bowel sounds are normal. She exhibits no distension. There is tenderness in the epigastric area. There is no rigidity and no guarding.    Neurological: She is alert.  Skin: Skin is dry.    ED Course  Procedures (including critical care time)  Labs Reviewed  CBC WITH DIFFERENTIAL - Abnormal; Notable for the following:    Neutrophils Relative 83 (*)     Lymphocytes Relative 10 (*)     All other components within normal limits  URINALYSIS, ROUTINE W REFLEX MICROSCOPIC - Abnormal; Notable for the following:    Hgb urine dipstick MODERATE (*)     All other components within normal limits  HEPATIC FUNCTION PANEL - Abnormal; Notable for the following:    AST 48 (*)  SLIGHT HEMOLYSIS   Alkaline Phosphatase 136 (*)     All other components within normal limits  BASIC METABOLIC PANEL  URINE MICROSCOPIC-ADD ON  LIPASE, BLOOD   No results found. Results for orders placed during the hospital encounter of 07/14/11  CBC WITH DIFFERENTIAL      Component Value Range   WBC 7.7  4.0 - 10.5 K/uL   RBC 4.61  3.87 - 5.11 MIL/uL   Hemoglobin 14.4  12.0 - 15.0 g/dL   HCT 16.1  09.6 - 04.5 %   MCV 90.9  78.0 - 100.0 fL   MCH 31.2  26.0 - 34.0 pg   MCHC 34.4  30.0 -  36.0 g/dL   RDW 96.0  45.4 - 09.8 %   Platelets 164  150 - 400 K/uL   Neutrophils Relative 83 (*) 43 - 77 %   Neutro Abs 6.4  1.7 - 7.7 K/uL   Lymphocytes Relative 10 (*) 12 - 46 %   Lymphs Abs 0.8  0.7 - 4.0 K/uL   Monocytes Relative 7  3 - 12 %   Monocytes Absolute 0.5  0.1 - 1.0 K/uL   Eosinophils Relative 0  0 - 5 %   Eosinophils Absolute 0.0  0.0 - 0.7 K/uL   Basophils Relative 0  0 - 1 %   Basophils Absolute 0.0  0.0 - 0.1 K/uL  BASIC METABOLIC PANEL      Component Value Range   Sodium 135  135 - 145 mEq/L   Potassium 4.1  3.5 - 5.1 mEq/L   Chloride 97  96 - 112 mEq/L   CO2 22  19 - 32 mEq/L    Glucose, Bld 75  70 - 99 mg/dL   BUN 7  6 - 23 mg/dL   Creatinine, Ser 1.19  0.50 - 1.10 mg/dL   Calcium 14.7  8.4 - 82.9 mg/dL   GFR calc non Af Amer >90  >90 mL/min   GFR calc Af Amer >90  >90 mL/min  URINALYSIS, ROUTINE W REFLEX MICROSCOPIC      Component Value Range   Color, Urine YELLOW  YELLOW   APPearance CLEAR  CLEAR   Specific Gravity, Urine 1.008  1.005 - 1.030   pH 7.0  5.0 - 8.0   Glucose, UA NEGATIVE  NEGATIVE mg/dL   Hgb urine dipstick MODERATE (*) NEGATIVE   Bilirubin Urine NEGATIVE  NEGATIVE   Ketones, ur NEGATIVE  NEGATIVE mg/dL   Protein, ur NEGATIVE  NEGATIVE mg/dL   Urobilinogen, UA 0.2  0.0 - 1.0 mg/dL   Nitrite NEGATIVE  NEGATIVE   Leukocytes, UA NEGATIVE  NEGATIVE  URINE MICROSCOPIC-ADD ON      Component Value Range   Squamous Epithelial / LPF RARE  RARE   WBC, UA 3-6  <3 WBC/hpf   RBC / HPF 0-2  <3 RBC/hpf   Bacteria, UA RARE  RARE  LIPASE, BLOOD      Component Value Range   Lipase 33  11 - 59 U/L  HEPATIC FUNCTION PANEL      Component Value Range   Total Protein 8.3  6.0 - 8.3 g/dL   Albumin 4.2  3.5 - 5.2 g/dL   AST 48 (*) 0 - 37 U/L   ALT 25  0 - 35 U/L   Alkaline Phosphatase 136 (*) 39 - 117 U/L   Total Bilirubin 0.3  0.3 - 1.2 mg/dL   Bilirubin, Direct <5.6  0.0 - 0.3 mg/dL   Indirect Bilirubin NOT CALCULATED  0.3 - 0.9 mg/dL     1. Abdominal pain, acute, epigastric       MDM  Patient with a history of chronic pancreatitis and gastric ulcer presented with acute onset of abdominal pain. Patient had had frequent admissions for the same chief complaint, with resolution of symptoms with pain and anti-nausea medications. CBC, BMP, LFT's, Lipase: unremarkable. Patient pain, nausea, and vomiting resolved with Dilaudid and Reglan. Patient tolerated PO challenge. Patient has no red flags for peritonitis, SBO, gastric or duodenal ulcer, or other more serious acute abdominal process. Patient discharged and instructed to follow-up with PCP for  chronic pain management. Patient discharged with return precautions.  Pixie Casino, PA-C 07/14/11 [id number]

## 2011-07-14 NOTE — ED Notes (Signed)
Attempt x 2 to start IV unsuccessfully

## 2011-07-14 NOTE — ED Notes (Signed)
Pt requesting pain meds to be ordered IM

## 2011-07-14 NOTE — ED Notes (Signed)
Pt requesting medicine for pain and nausea

## 2011-07-14 NOTE — ED Notes (Signed)
Pt c/o of ongoing severe pain. PA notified.

## 2011-07-14 NOTE — ED Notes (Signed)
IV unsuccessful at IV attempt. Multiple attempts performed unsuccessful.

## 2011-07-16 NOTE — ED Provider Notes (Signed)
Medical screening examination/treatment/procedure(s) were performed by non-physician practitioner and as supervising physician I was immediately available for consultation/collaboration.   Rayhaan Huster R Chrles Selley, MD 07/16/11 1113 

## 2011-08-15 ENCOUNTER — Emergency Department (HOSPITAL_COMMUNITY): Payer: Medicaid Other

## 2011-08-15 ENCOUNTER — Encounter (HOSPITAL_COMMUNITY): Payer: Self-pay | Admitting: Family Medicine

## 2011-08-15 ENCOUNTER — Emergency Department (HOSPITAL_COMMUNITY)
Admission: EM | Admit: 2011-08-15 | Discharge: 2011-08-15 | Disposition: A | Discharge status: Home / Self Care | Payer: Medicaid Other | Attending: Emergency Medicine | Admitting: Emergency Medicine

## 2011-08-15 DIAGNOSIS — M25579 Pain in unspecified ankle and joints of unspecified foot: Secondary | ICD-10-CM | POA: Insufficient documentation

## 2011-08-15 DIAGNOSIS — J449 Chronic obstructive pulmonary disease, unspecified: Secondary | ICD-10-CM | POA: Insufficient documentation

## 2011-08-15 DIAGNOSIS — M7989 Other specified soft tissue disorders: Secondary | ICD-10-CM | POA: Insufficient documentation

## 2011-08-15 DIAGNOSIS — Z79899 Other long term (current) drug therapy: Secondary | ICD-10-CM | POA: Insufficient documentation

## 2011-08-15 DIAGNOSIS — R609 Edema, unspecified: Secondary | ICD-10-CM | POA: Insufficient documentation

## 2011-08-15 DIAGNOSIS — J4489 Other specified chronic obstructive pulmonary disease: Secondary | ICD-10-CM | POA: Insufficient documentation

## 2011-08-15 DIAGNOSIS — K861 Other chronic pancreatitis: Secondary | ICD-10-CM | POA: Insufficient documentation

## 2011-08-15 DIAGNOSIS — W010XXA Fall on same level from slipping, tripping and stumbling without subsequent striking against object, initial encounter: Secondary | ICD-10-CM | POA: Insufficient documentation

## 2011-08-15 DIAGNOSIS — S93409A Sprain of unspecified ligament of unspecified ankle, initial encounter: Secondary | ICD-10-CM | POA: Insufficient documentation

## 2011-08-15 LAB — CBC WITH DIFFERENTIAL/PLATELET
Basophils Relative: 0 % (ref 0–1)
Eosinophils Absolute: 0.1 10*3/uL (ref 0.0–0.7)
Lymphs Abs: 1.2 10*3/uL (ref 0.7–4.0)
MCH: 31.7 pg (ref 26.0–34.0)
MCHC: 34.9 g/dL (ref 30.0–36.0)
Neutro Abs: 5.7 10*3/uL (ref 1.7–7.7)
Neutrophils Relative %: 74 % (ref 43–77)
Platelets: 138 10*3/uL — ABNORMAL LOW (ref 150–400)
RBC: 4.45 MIL/uL (ref 3.87–5.11)

## 2011-08-15 LAB — COMPREHENSIVE METABOLIC PANEL
ALT: 14 U/L (ref 0–35)
Albumin: 4 g/dL (ref 3.5–5.2)
Alkaline Phosphatase: 121 U/L — ABNORMAL HIGH (ref 39–117)
Potassium: 3.8 mEq/L (ref 3.5–5.1)
Sodium: 135 mEq/L (ref 135–145)
Total Protein: 7.9 g/dL (ref 6.0–8.3)

## 2011-08-15 LAB — HEPATIC FUNCTION PANEL
ALT: 15 U/L (ref 0–35)
AST: 24 U/L (ref 0–37)
Albumin: 4 g/dL (ref 3.5–5.2)
Alkaline Phosphatase: 124 U/L — ABNORMAL HIGH (ref 39–117)
Total Bilirubin: 0.4 mg/dL (ref 0.3–1.2)

## 2011-08-15 LAB — BASIC METABOLIC PANEL
BUN: 13 mg/dL (ref 6–23)
Calcium: 9.9 mg/dL (ref 8.4–10.5)
GFR calc non Af Amer: >90 mL/min (ref >90)
Glucose, Bld: 88 mg/dL (ref 70–99)

## 2011-08-15 MED ORDER — ALBUTEROL SULFATE (5 MG/ML) 0.5% IN NEBU
5.0000 mg | INHALATION_SOLUTION | RESPIRATORY_TRACT | Status: AC
  Administered 2011-08-15 (×3): 5 mg via RESPIRATORY_TRACT
  Filled 2011-08-15: qty 1
  Filled 2011-08-15: qty 1.5

## 2011-08-15 MED ORDER — ONDANSETRON HCL 4 MG PO TABS: 8.0000 mg | ORAL_TABLET | Freq: Once | ORAL | Status: AC

## 2011-08-15 MED ORDER — HYDROMORPHONE HCL PF 1 MG/ML IJ SOLN
1.0000 mg | Freq: Once | INTRAMUSCULAR | Status: AC
  Administered 2011-08-15: 1 mg via INTRAMUSCULAR

## 2011-08-15 MED ORDER — HYDROCODONE-ACETAMINOPHEN 5-325 MG PO TABS: 1.0000 | ORAL_TABLET | Freq: Four times a day (QID) | ORAL | Status: AC | PRN

## 2011-08-15 MED ORDER — IBUPROFEN 600 MG PO TABS: 600.0000 mg | ORAL_TABLET | Freq: Four times a day (QID) | ORAL | Status: AC | PRN

## 2011-08-15 MED ORDER — ONDANSETRON 8 MG PO TBDP
ORAL_TABLET | ORAL | Status: AC
  Administered 2011-08-15: 8 mg
  Filled 2011-08-15: qty 1

## 2011-08-15 MED ORDER — GI COCKTAIL ~~LOC~~
30.0000 mL | Freq: Once | ORAL | Status: AC
  Administered 2011-08-15: 30 mL via ORAL
  Filled 2011-08-15: qty 30

## 2011-08-15 MED ORDER — ONDANSETRON HCL 4 MG/2ML IJ SOLN: 4.0000 mg | Freq: Once | INTRAMUSCULAR | Status: DC

## 2011-08-15 MED ORDER — ONDANSETRON 4 MG PO TBDP
4.0000 mg | ORAL_TABLET | Freq: Once | ORAL | Status: AC
  Administered 2011-08-15: 4 mg via ORAL
  Filled 2011-08-15: qty 1

## 2011-08-15 MED ORDER — HYDROMORPHONE HCL PF 1 MG/ML IJ SOLN
1.0000 mg | Freq: Once | INTRAMUSCULAR | Status: DC
  Filled 2011-08-15: qty 1

## 2011-08-15 MED ORDER — IPRATROPIUM BROMIDE 0.02 % IN SOLN
0.5000 mg | Freq: Once | RESPIRATORY_TRACT | Status: AC
  Administered 2011-08-15: 0.5 mg via RESPIRATORY_TRACT
  Filled 2011-08-15: qty 2.5

## 2011-08-15 MED ORDER — HYDROMORPHONE HCL PF 1 MG/ML IJ SOLN
1.0000 mg | Freq: Once | INTRAMUSCULAR | Status: AC
  Administered 2011-08-15: 1 mg via INTRAMUSCULAR
  Filled 2011-08-15: qty 1

## 2011-08-15 NOTE — ED Notes (Signed)
Per EMS, patient slipped and fell on her back steps injuring her left foot. Also c/o abdominal pain with nausea and vomiting.

## 2011-08-15 NOTE — ED Provider Notes (Signed)
History     CSN: [id number]  Arrival date & time 08/15/11  4098   First MD Initiated Contact with Patient 08/15/11 [phone]      Chief Complaint  Patient presents with  . Pancreatitis  . Foot Pain    (Consider location/radiation/quality/duration/timing/severity/associated sxs/prior treatment) HPI Comments: Pt with hx of chronic pancreatitis comes in with cc of abd pain and foot pain. Pt states that she started having her chronic pancreatitis flare up y;day, with epigastric pain and some n/v y;day. She has had about 3 episodes of emesis, non bilious and non bloody. There is no diarrhea. Pt went out to vomit, slipped and fell. In the process of the fall, patient landed awkwardly on the left foot - and twisted it. Pt has not ambulated since. The pain starts at the ankle and covers entire foot.  Patient is a 61 y.o. female presenting with lower extremity pain. The history is provided by the patient and medical records.  Foot Pain Associated symptoms include abdominal pain. Pertinent negatives include no chest pain, no headaches and no shortness of breath.    Past Medical History  Diagnosis Date  . Pancreatitis   . COPD (chronic obstructive pulmonary disease)   . Substance abuse   . Cancer     renal ca  . Seizures   . Pancreatitis     Past Surgical History  Procedure Date  . Partial gastrectomy     No family history on file.  History  Substance Use Topics  . Smoking status: Current Everyday Smoker -- 0.5 packs/day    Types: Cigarettes  . Smokeless tobacco: Never Used  . Alcohol Use: Yes     Hx of Alcohol abuse, reports she "still has an occasional beer", but drinks on her medications    OB History    Grav Para Term Preterm Abortions TAB SAB Ect Mult Living                  Review of Systems  Constitutional: Positive for activity change.  HENT: Negative for neck pain.   Respiratory: Negative for shortness of breath.   Cardiovascular: Negative for chest pain.    Gastrointestinal: Positive for nausea, vomiting and abdominal pain. Negative for diarrhea and constipation.  Genitourinary: Negative for dysuria.  Musculoskeletal: Positive for arthralgias and gait problem.  Neurological: Negative for headaches.    Allergies  Aspirin and Chlorpromazine hcl  Home Medications   Current Outpatient Rx  Name Route Sig Dispense Refill  . ALBUTEROL SULFATE HFA 108 (90 BASE) MCG/ACT IN AERS Inhalation Inhale 2 puffs into the lungs every 4 (four) hours as needed. For shortness of breath.    Marland Kitchen FLUTICASONE PROPIONATE  HFA 110 MCG/ACT IN AERO Inhalation Inhale 2 puffs into the lungs 2 (two) times daily. 1 Inhaler 0  . HYDROMORPHONE HCL 4 MG PO TABS Oral Take 1 tablet (4 mg total) by mouth every 6 (six) hours as needed. For pain. 20 tablet 0  . LORAZEPAM 1 MG PO TABS Oral Take 1 tablet (1 mg total) by mouth every 6 (six) hours as needed. For anxiety. 30 tablet 0  . ONDANSETRON HCL 4 MG PO TABS Oral Take 4 mg by mouth every 8 (eight) hours as needed. For nausea.      BP 178/110  Pulse 97  Temp 98.6 F (37 C) (Oral)  Resp 18  SpO2 96%  Physical Exam  Constitutional: She is oriented to person, place, and time. She appears well-developed and well-nourished.  HENT:  Head: Normocephalic and atraumatic.  Eyes: EOM are normal. Pupils are equal, round, and reactive to light.  Neck: Neck supple.  Cardiovascular: Normal rate, regular rhythm and normal heart sounds.   No murmur heard. Pulmonary/Chest: Effort normal. No respiratory distress.  Abdominal: Soft. Bowel sounds are normal. She exhibits no distension. There is tenderness. There is no rebound and no guarding.  Musculoskeletal: She exhibits edema and tenderness.       Left foot and ankle has mild swelling. There is no deformity. Pt has medial and lateral malleoli tenderness and there is tenderness with eversion, inversion and plantar + dorsi flexion. 2+ DP, and PT, sensation is intact.  Neurological: She is  alert and oriented to person, place, and time.  Skin: Skin is warm and dry.    ED Course  Procedures (including critical care time)  Labs Reviewed  CBC WITH DIFFERENTIAL - Abnormal; Notable for the following:    Platelets 138 (*)     All other components within normal limits  COMPREHENSIVE METABOLIC PANEL - Abnormal; Notable for the following:    Alkaline Phosphatase 121 (*)     All other components within normal limits  HEPATIC FUNCTION PANEL - Abnormal; Notable for the following:    Alkaline Phosphatase 124 (*)     All other components within normal limits  BASIC METABOLIC PANEL - Abnormal; Notable for the following:    Sodium 133 (*)     All other components within normal limits  AMYLASE  LIPASE, BLOOD  PRO B NATRIURETIC PEPTIDE   No results found.   No diagnosis found.    MDM  Pt essentially comes in after a fall and injuring her foot and ankle. She also has chronic pancreatitis - and is undergoign a mild flare up. We will get basic GI labs, control pain and nausea and also get foot and ankle x-rays.           Derwood Kaplan, MD 08/15/11 [phone]

## 2011-08-15 NOTE — ED Notes (Signed)
[id number]<JY> Expected date:<BR> Expected time:<BR> Means of arrival:<BR> Comments:<BR> Medic 32, Left foot, abd pain

## 2011-09-03 IMAGING — CR DG CHEST 1V PORT
1 series · 1 of 1 positions shown · non-contrast
Comparison: Portable chest x-ray 06/26/2008 and two-view chest x-
ray 01/18/2007.

CLINICAL DATA: Mid chest pain.  Tachycardia.  Smoker.

PORTABLE CHEST - 1 VIEW [DATE]/9699 [id number] hours:

[view not recorded]
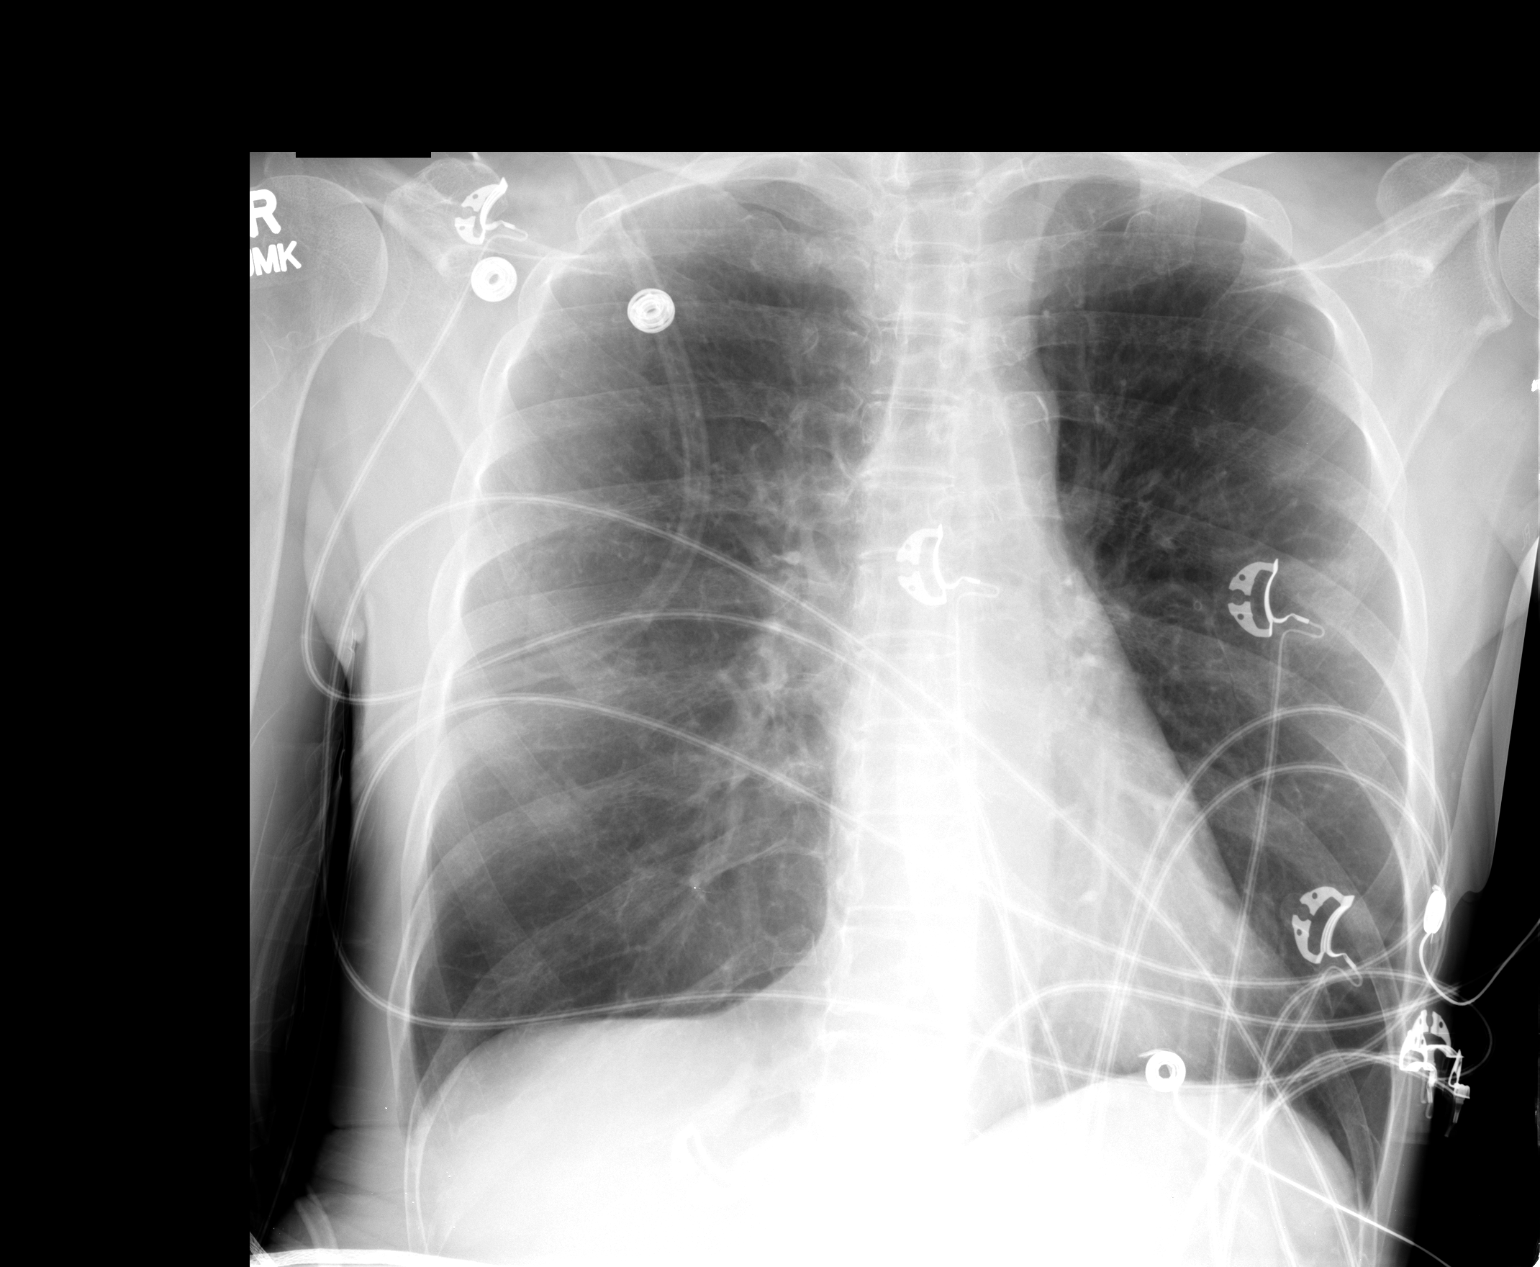

[1 of 1 positions shown; findings below may reference images not displayed]

FINDINGS: Heart size upper normal and stable.  Thoracic aorta
mildly atherosclerotic, unchanged.  Hilar and mediastinal contours
otherwise unremarkable. Lungs hyperinflated but clear.  No pleural
effusions.
IMPRESSION: COPD/emphysema.  No acute cardiopulmonary disease.

## 2011-09-04 IMAGING — CR DG ABDOMEN ACUTE W/ 1V CHEST
3 series · 3 of 3 positions shown · non-contrast
Comparison: CT same day

CLINICAL DATA: Abdominal pain.  Nausea and vomiting.

ACUTE ABDOMEN SERIES (ABDOMEN 2 VIEW & CHEST 1 VIEW)

[w chest pa]
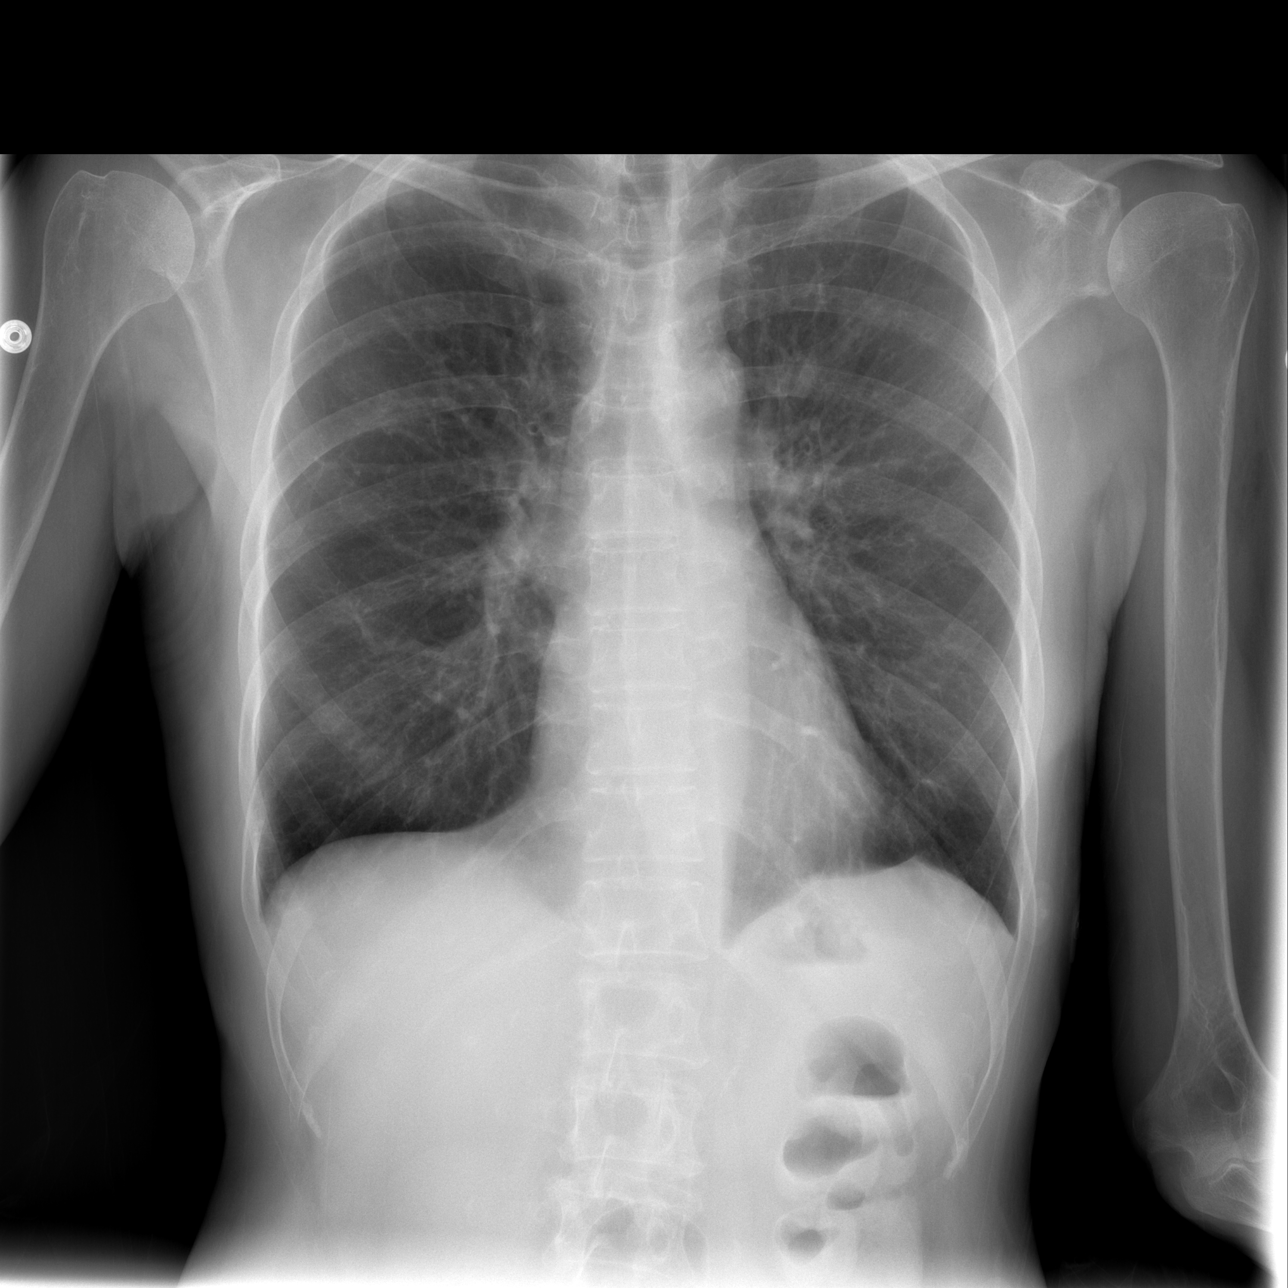

[w abdomen upright]
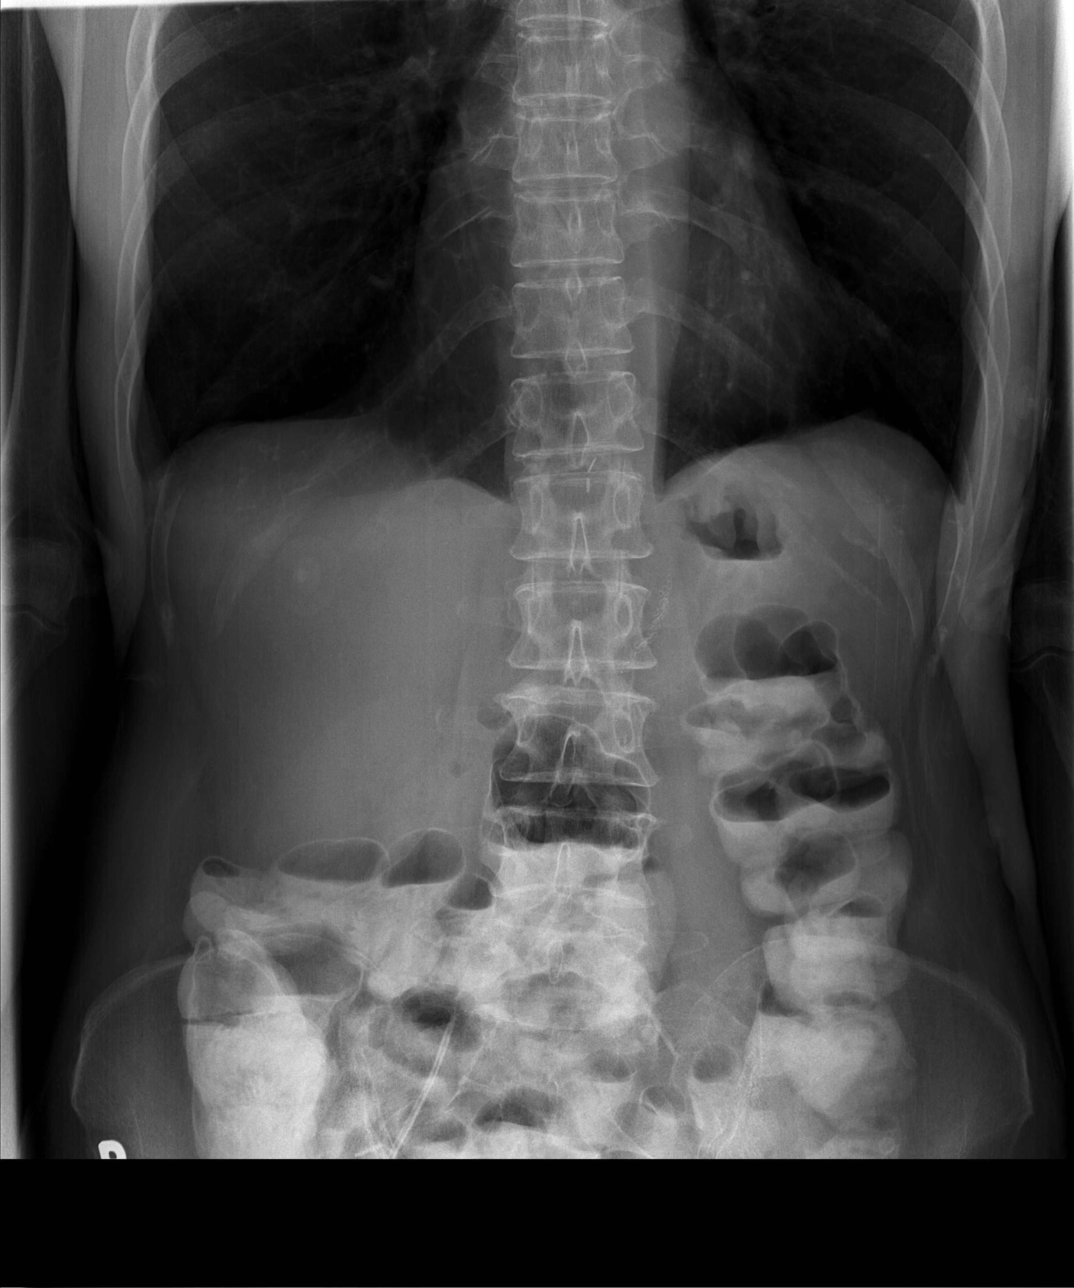

[t abdomen supine]
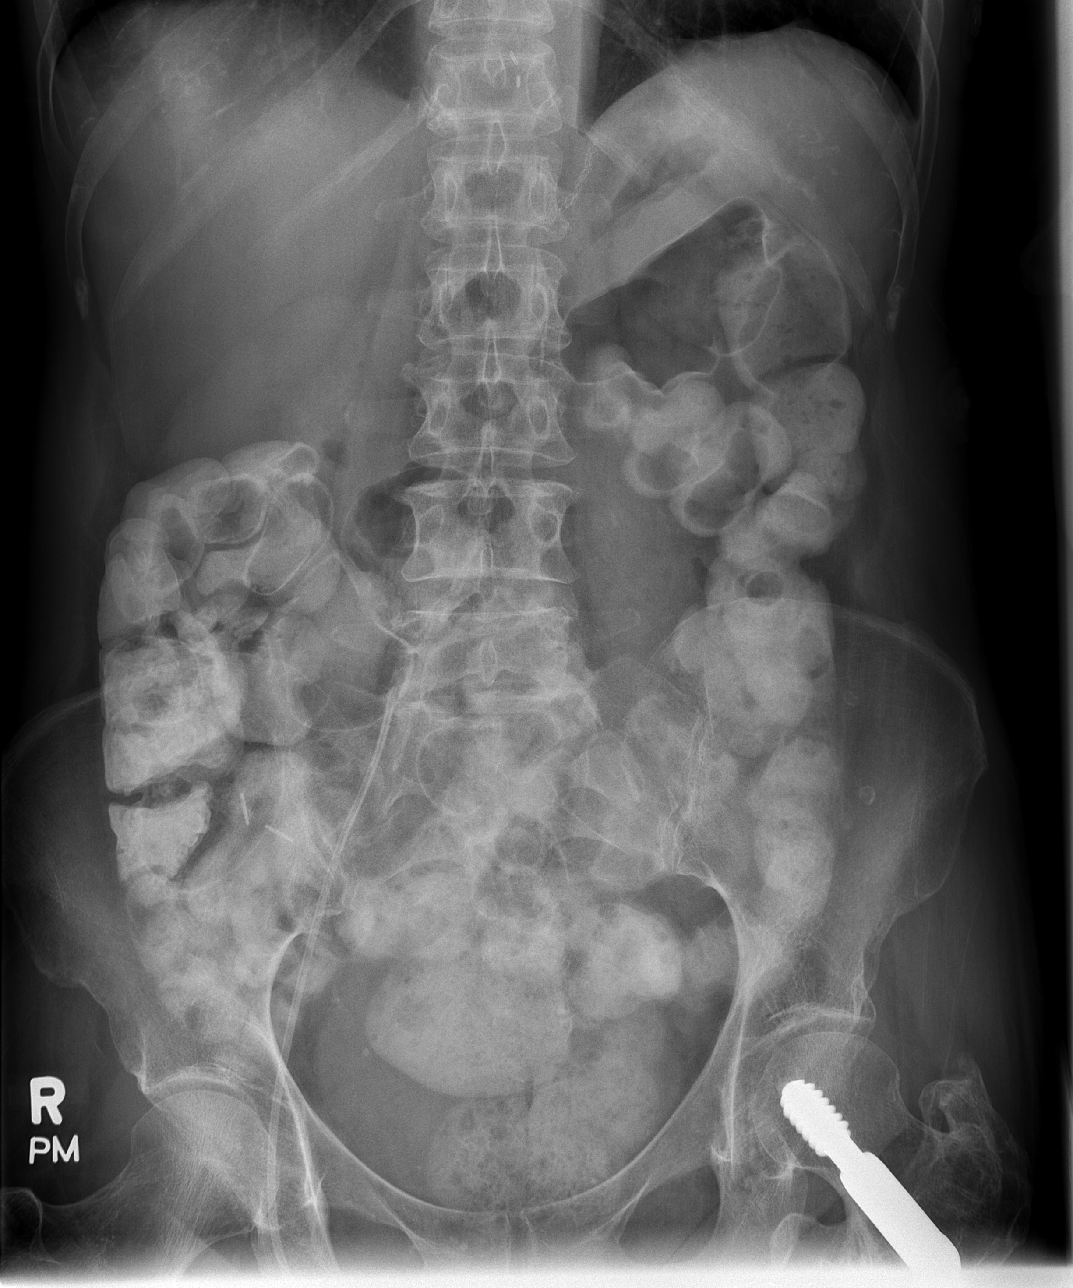

[3 of 3 positions shown; findings below may reference images not displayed]

FINDINGS: Previously administered contrast is present throughout
the colon.  The small bowel gas pattern is normal.  There are no
anastomotic sutures in the epigastric region.  There is no free
air.  The right femoral venous catheter is in place. The tip is in
the common iliac vein on the right.

One-view chest shows normal heart and mediastinal shadows.  The
lungs are clear.  No effusions.  No free air under the diaphragm.
IMPRESSION: Negative acute abdominal series.  Previously administered contrast
present throughout the colon.  No sign of obstruction or free air.
Right femoral venous catheter well positioned.

## 2011-09-04 IMAGING — CT CT ABD-PELV W/O CM
2 of 4 series · 16 of 46 positions shown, 18 images · non-contrast
Comparison: 12/09/2008.

CLINICAL DATA: Epigastric pain.  Nausea, vomiting, diarrhea.
Cirrhosis.  History of left renal cell carcinoma.  Status post
partial gastrectomy.

CT ABDOMEN AND PELVIS WITHOUT CONTRAST
TECHNIQUE: Multidetector CT imaging of the abdomen and pelvis was
performed following the standard protocol without intravenous
contrast.

[Series 2: abd/pelv w/o 5.0 b31f st · axial · non-contrast · 0.51mm/px · z∈[-398,-28]mm · 13 of 82 slices shown, 15 images]
[im 4/82  soft-tissue]
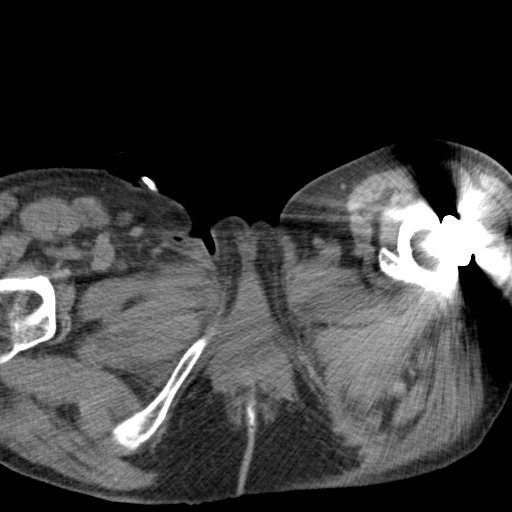
[im 4/82  bone]
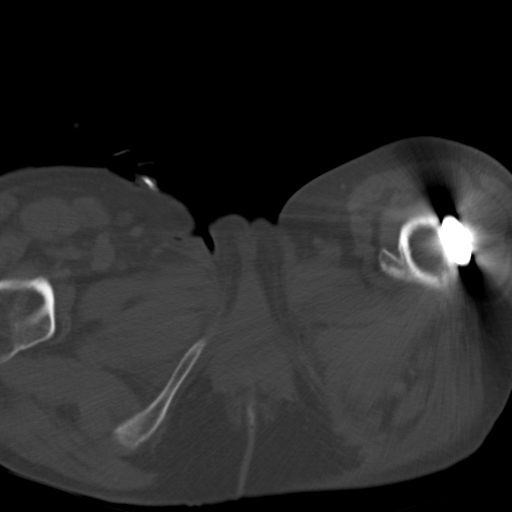
[im 10/82  soft-tissue]
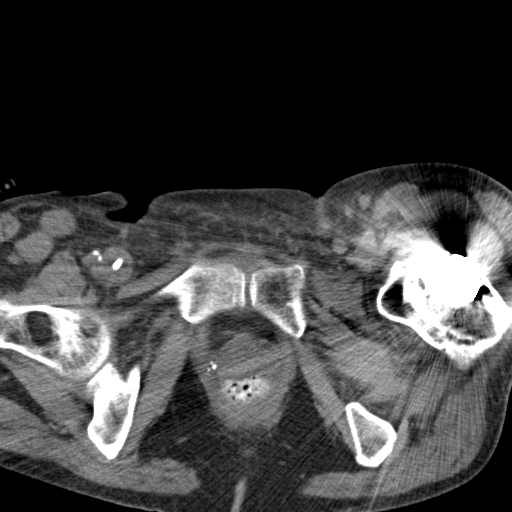
[im 17/82  soft-tissue]
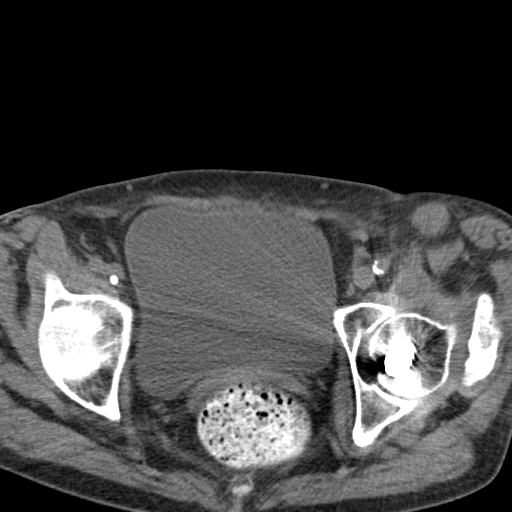
[im 23/82  soft-tissue]
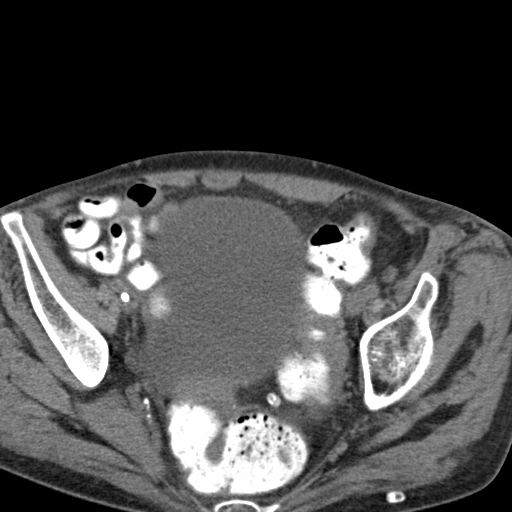
[im 30/82  soft-tissue]
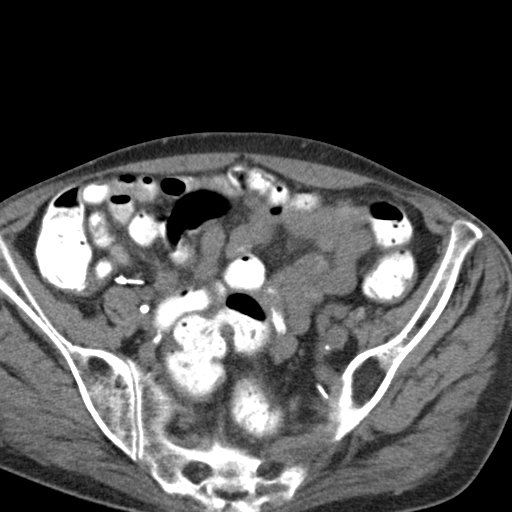
[im 36/82  soft-tissue]
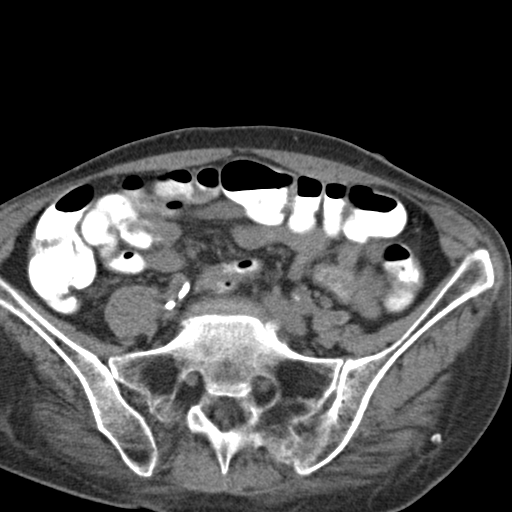
[im 43/82  soft-tissue]
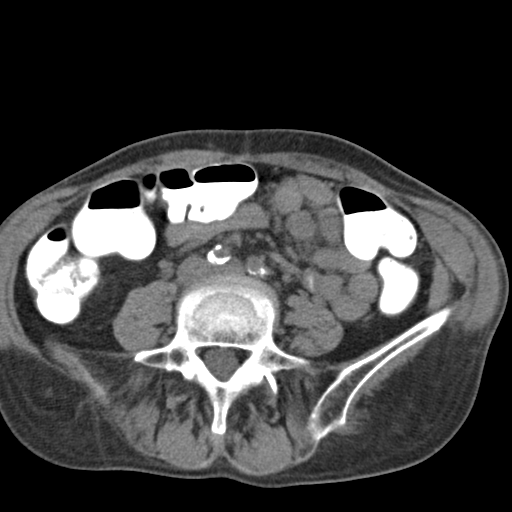
[im 46/82  soft-tissue]
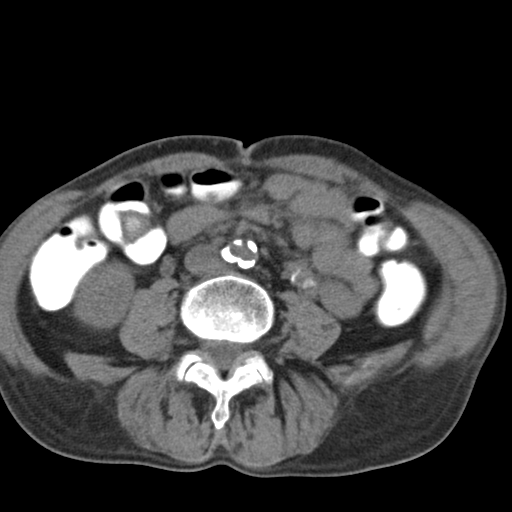
[im 52/82  soft-tissue]
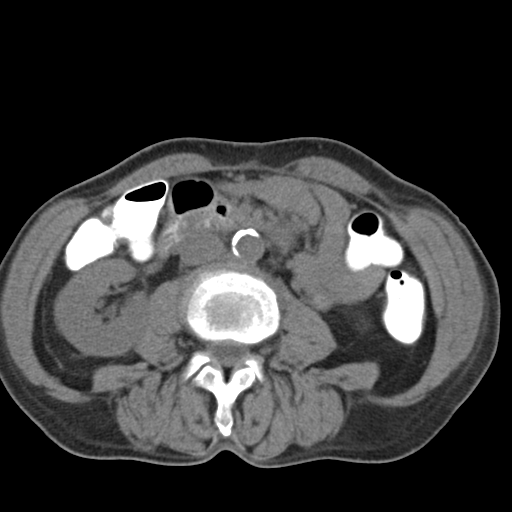
[im 52/82  bone]
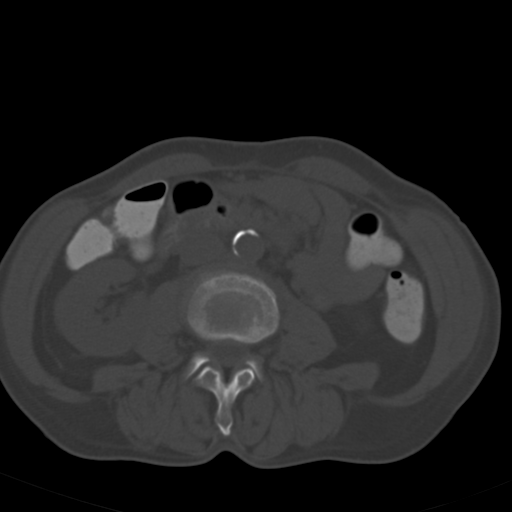
[im 59/82  soft-tissue]
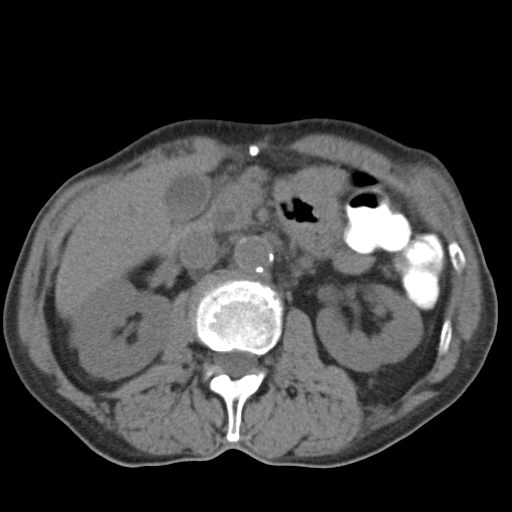
[im 65/82  soft-tissue]
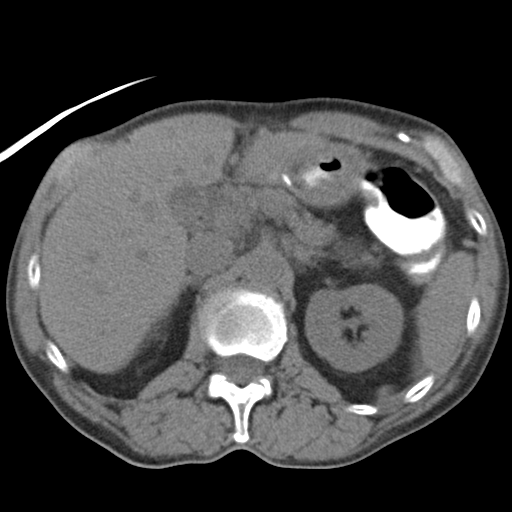
[im 72/82  soft-tissue]
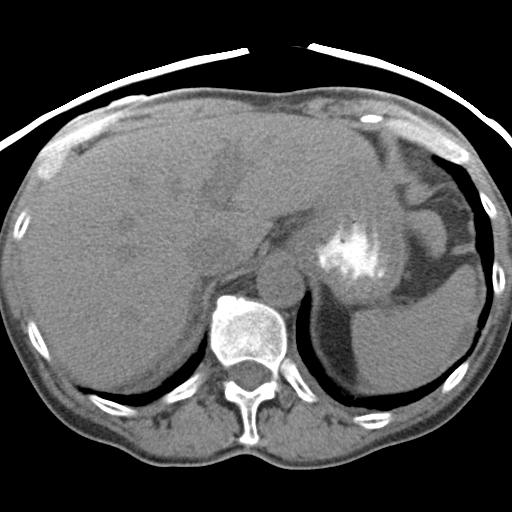
[im 78/82  soft-tissue]
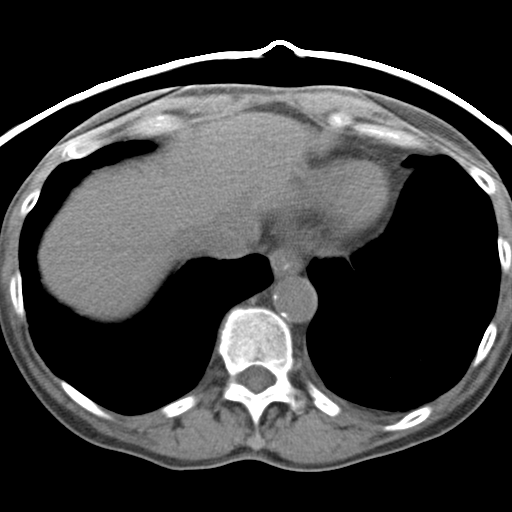

[Series 602: coronals · coronal · 0.80mm/px · 3 of 94 slices shown]
[im 32/94  soft-tissue]
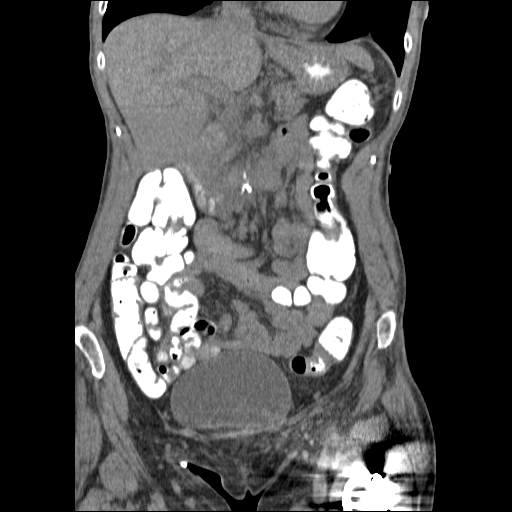
[im 42/94  soft-tissue]
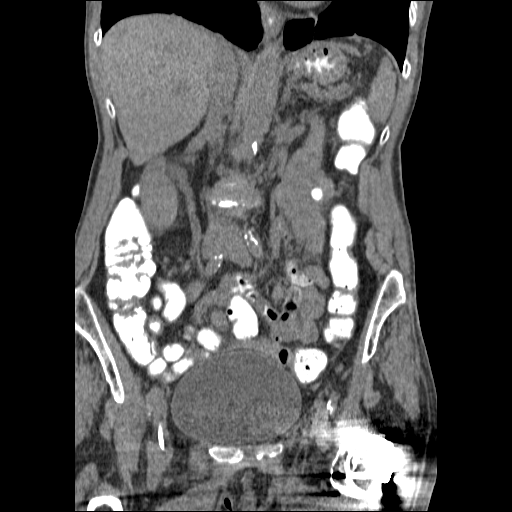
[im 52/94  soft-tissue]
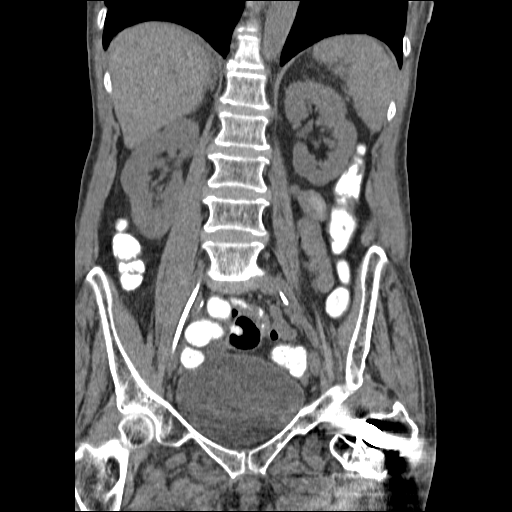

[16 of 46 positions shown; findings below may reference images not displayed]

FINDINGS: No focal abnormalities seen in the liver or spleen on
this study performed without intravenous contrast material.  The
patient has a reported history of cirrhosis and there does appear
to be a very subtle nodular contour in the liver margin.
Postsurgical changes noted in the distal stomach.  The duodenum is
unremarkable.  Pancreas has normal uninfused features.  There is no
adrenal mass. Ablation defect is identified in the inferior aspect
of the left kidney, stable in the interval.  The right kidney has
normal uninfused features.

High density material layering dependently in the gallbladder lumen
suggests sludge.  Gallbladder wall is poorly defined and while this
may be secondary to underdistension, gallbladder wall edema is a
consideration.

No retroperitoneal lymphadenopathy.  No intraperitoneal free fluid.
No evidence for small bowel obstruction.

Bladder is distended.  The uterus is surgically absent.  There is
no evidence for an adnexal mass.  Right to femoral venous catheter
is noted with distal tip positioned in the right common iliac vein.
The patient is status post a left dynamic hip screw placement.  No
colonic diverticulitis.  Terminal ileum is normal.  The appendix is
not visualized.
IMPRESSION: Stable exam.  No new or acute findings on today's study explain the
patient's history of abdominal pain.  There does appear be some
sludge within the lumen of the gallbladder raise the question of
colon stasis.  Mild haziness of the gallbladder wall does raise the
question of edema and abdominal ultrasound may prove helpful to
further evaluate.

## 2011-09-05 IMAGING — US US ABDOMEN COMPLETE
1 series · 13 of 25 positions shown · non-contrast
Comparison: Renal ultrasound 02/06/2008 and noncontrast abdominal
CT 02/19/2009.

CLINICAL DATA: Abdominal pain.

COMPLETE ABDOMINAL ULTRASOUND

[Series 1: us abdomen complete · 0.28mm/px · 13 of 96 slices shown]
[im 1/96]
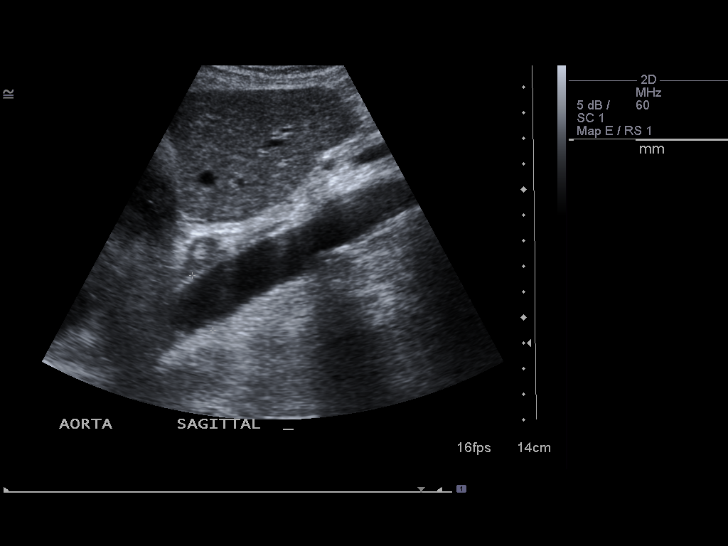
[im 8/96]
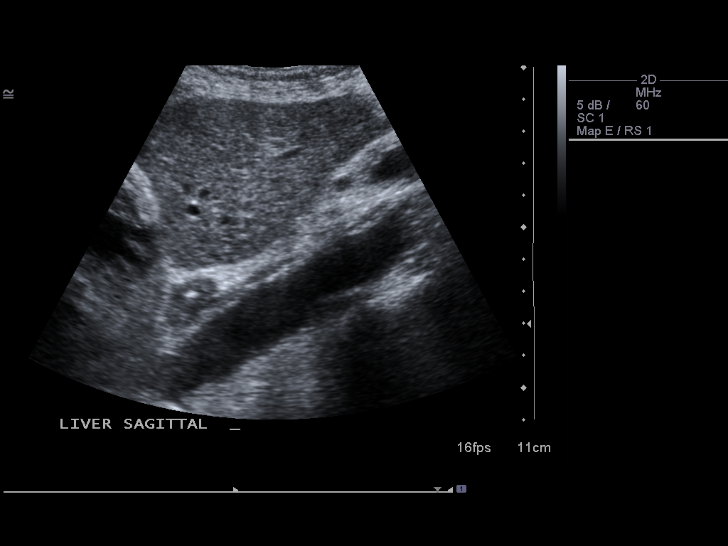
[im 16/96]
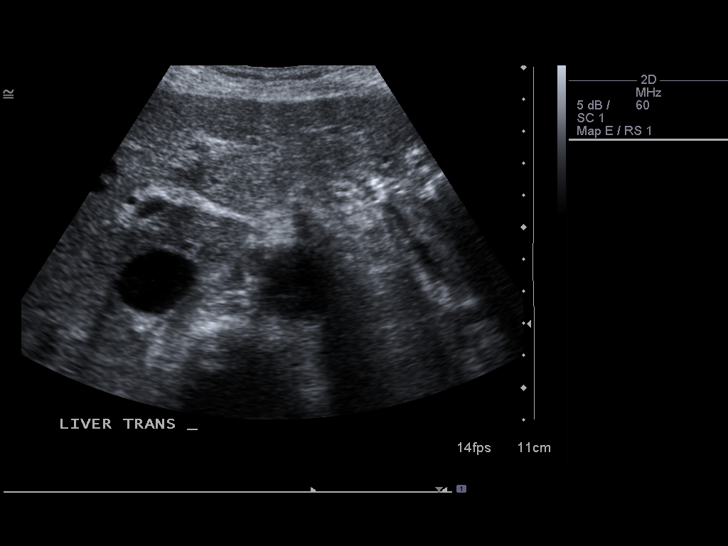
[im 24/96]
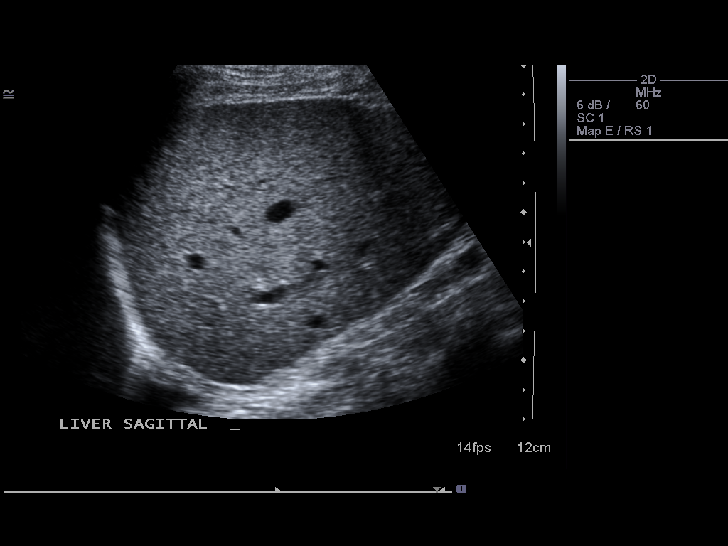
[im 32/96]
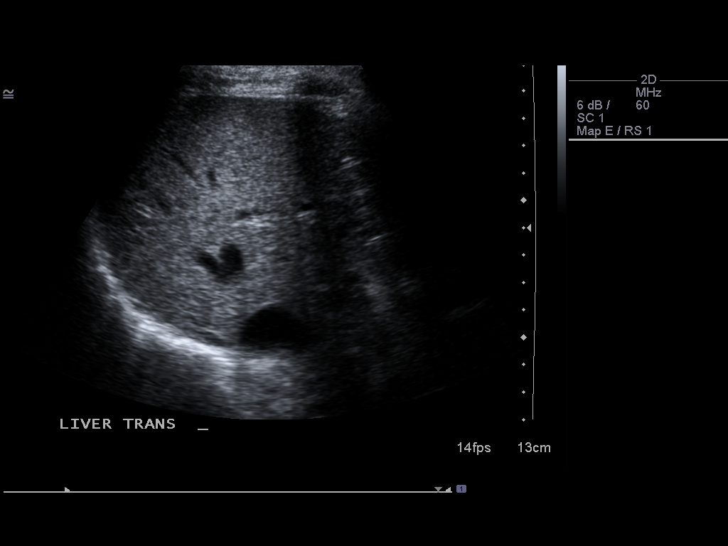
[im 40/96]
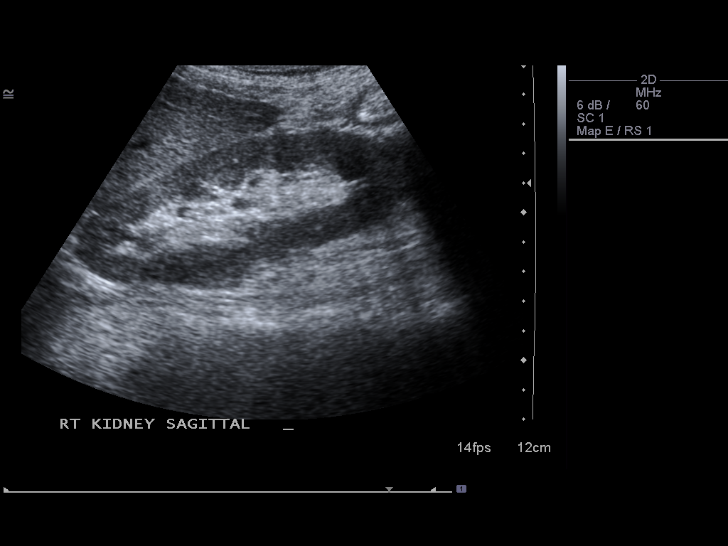
[im 48/96]
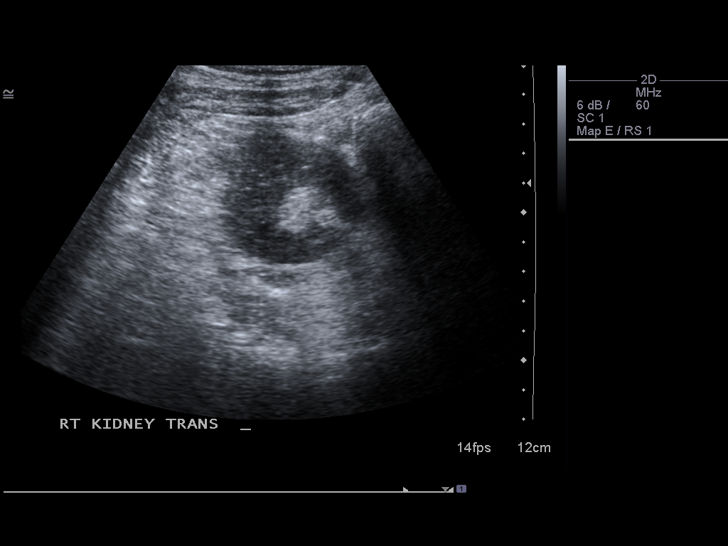
[im 56/96]
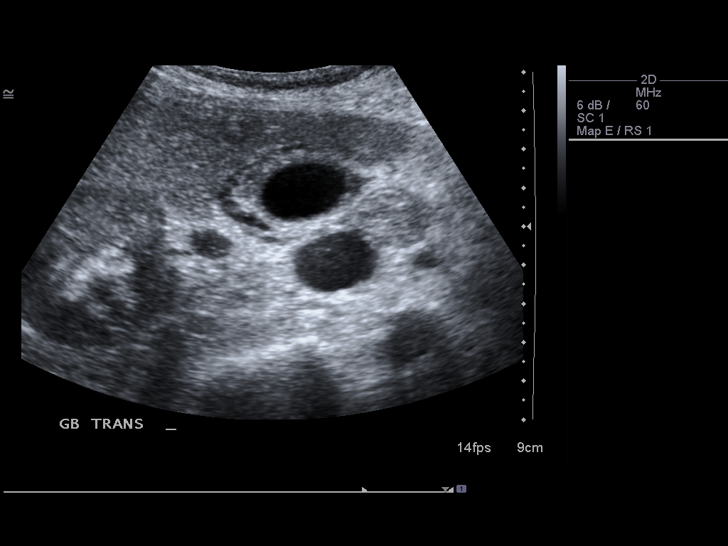
[im 64/96]
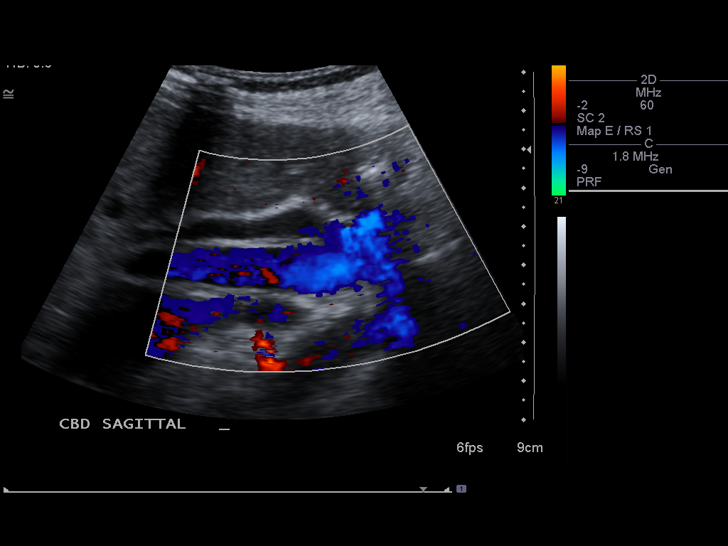
[im 72/96]
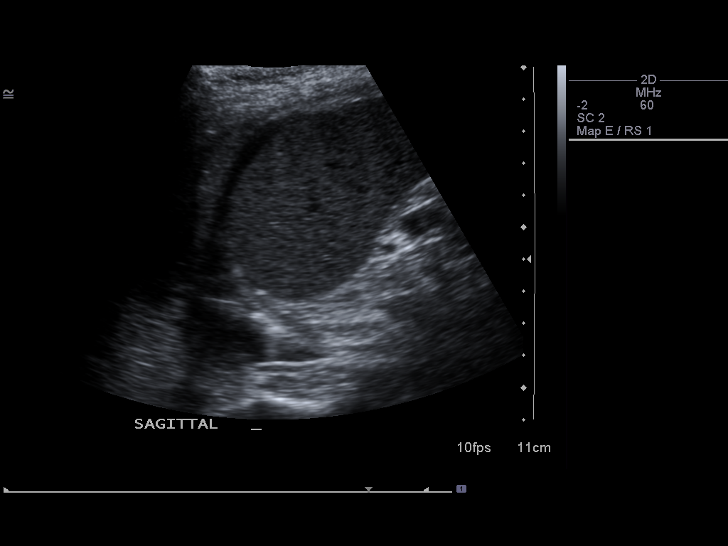
[im 80/96]
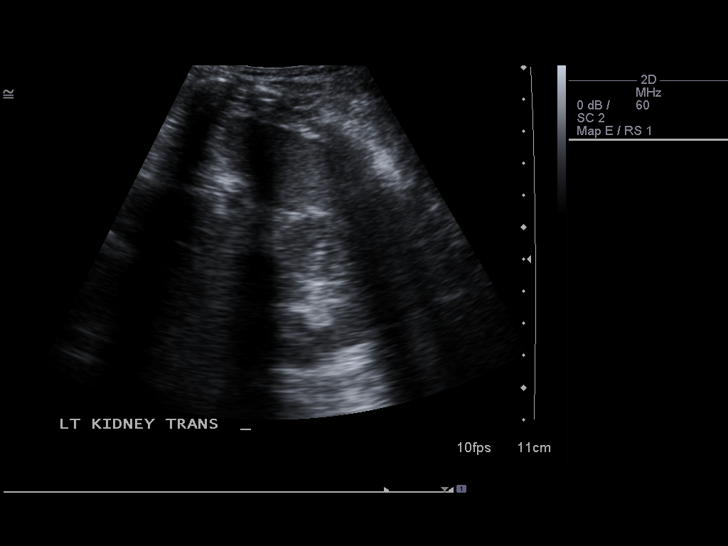
[im 88/96]
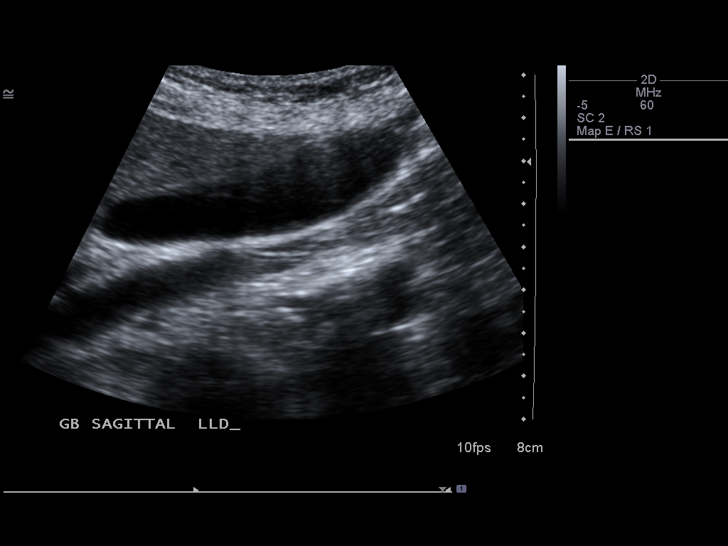
[im 96/96]
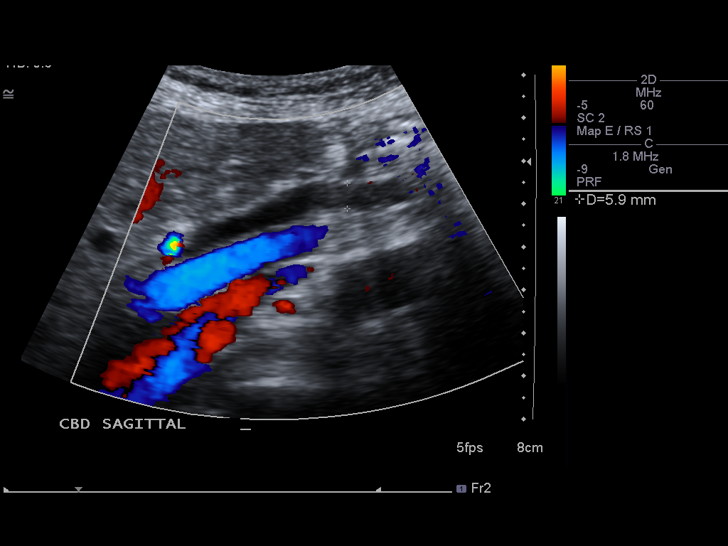

[13 of 25 positions shown; findings below may reference images not displayed]

FINDINGS: Gallbladder: There is uniform thickening of the gallbladder wall to
4.9 mm.  No gallstones are identified.  There is no sonographic
Murphy's sign.

Common bile duct:   Normal in caliber without filling defects.

Liver:  Echogenicity is within normal limits.  No focal hepatic
abnormalities are identified.

IVC: Visualized portions appear unremarkable.

Pancreas:  Visualized portions appear unremarkable.

Spleen:  Visualized portions appear unremarkable.

Right Kidney:  The renal cortical thickness and echogenicity are
preserved.  There is no hydronephrosis or focal abnormality. Renal
length is 11.8 cm.

Left Kidney:  The renal cortical thickness and echogenicity are
preserved.  The known ablation defect in the lower pole is not well
visualized.  There is no hydronephrosis or focal abnormality. Renal
length is 9.9 cm.

Abdominal aorta:  Visualized portions appear unremarkable.

Small bilateral pleural effusions are noted incidentally.
IMPRESSION: 1.  Nonspecific uniform gallbladder wall thickening may be
secondary to reported liver disease or adenomyomatosis.  No
gallstones or sonographic Murphy's sign demonstrated.
2.  Small bilateral pleural effusions.
3.  Otherwise unremarkable abdominal ultrasound.

## 2011-09-20 ENCOUNTER — Emergency Department (HOSPITAL_BASED_OUTPATIENT_CLINIC_OR_DEPARTMENT_OTHER)
Admission: EM | Admit: 2011-09-20 | Discharge: 2011-09-20 | Disposition: A | Discharge status: Home / Self Care | Payer: Medicaid Other | Attending: Emergency Medicine | Admitting: Emergency Medicine

## 2011-09-20 ENCOUNTER — Encounter (HOSPITAL_BASED_OUTPATIENT_CLINIC_OR_DEPARTMENT_OTHER): Payer: Self-pay

## 2011-09-20 DIAGNOSIS — F411 Generalized anxiety disorder: Secondary | ICD-10-CM | POA: Insufficient documentation

## 2011-09-20 DIAGNOSIS — F13239 Sedative, hypnotic or anxiolytic dependence with withdrawal, unspecified: Secondary | ICD-10-CM

## 2011-09-20 DIAGNOSIS — R109 Unspecified abdominal pain: Secondary | ICD-10-CM | POA: Insufficient documentation

## 2011-09-20 DIAGNOSIS — Z888 Allergy status to other drugs, medicaments and biological substances status: Secondary | ICD-10-CM | POA: Insufficient documentation

## 2011-09-20 DIAGNOSIS — F1520 Other stimulant dependence, uncomplicated: Secondary | ICD-10-CM | POA: Insufficient documentation

## 2011-09-20 DIAGNOSIS — K861 Other chronic pancreatitis: Secondary | ICD-10-CM | POA: Insufficient documentation

## 2011-09-20 DIAGNOSIS — F19939 Other psychoactive substance use, unspecified with withdrawal, unspecified: Secondary | ICD-10-CM | POA: Insufficient documentation

## 2011-09-20 DIAGNOSIS — Z85528 Personal history of other malignant neoplasm of kidney: Secondary | ICD-10-CM | POA: Insufficient documentation

## 2011-09-20 DIAGNOSIS — F17200 Nicotine dependence, unspecified, uncomplicated: Secondary | ICD-10-CM | POA: Insufficient documentation

## 2011-09-20 LAB — COMPREHENSIVE METABOLIC PANEL
Albumin: 4.1 g/dL (ref 3.5–5.2)
BUN: 11 mg/dL (ref 6–23)
Chloride: 91 mEq/L — ABNORMAL LOW (ref 96–112)
Creatinine, Ser: 0.5 mg/dL (ref 0.50–1.10)
GFR calc Af Amer: >90 mL/min (ref >90)
GFR calc non Af Amer: >90 mL/min (ref >90)
Total Bilirubin: 0.4 mg/dL (ref 0.3–1.2)

## 2011-09-20 LAB — CBC WITH DIFFERENTIAL/PLATELET
Eosinophils Relative: 2 % (ref 0–5)
Lymphs Abs: 0.4 10*3/uL — ABNORMAL LOW (ref 0.7–4.0)
MCH: 30.6 pg (ref 26.0–34.0)
MCV: 86.5 fL (ref 78.0–100.0)
Monocytes Absolute: 0.4 10*3/uL (ref 0.1–1.0)
Platelets: UNDETERMINED 10*3/uL (ref 150–400)
RBC: 4.51 MIL/uL (ref 3.87–5.11)
RDW: 12.5 % (ref 11.5–15.5)

## 2011-09-20 LAB — LIPASE, BLOOD: Lipase: 18 U/L (ref 11–59)

## 2011-09-20 MED ORDER — PROMETHAZINE HCL 25 MG/ML IJ SOLN
25.0000 mg | Freq: Once | INTRAMUSCULAR | Status: AC
  Administered 2011-09-20: 25 mg via INTRAMUSCULAR
  Filled 2011-09-20: qty 1

## 2011-09-20 MED ORDER — HYDROMORPHONE HCL PF 2 MG/ML IJ SOLN
2.0000 mg | Freq: Once | INTRAMUSCULAR | Status: AC
  Administered 2011-09-20: 2 mg via INTRAMUSCULAR
  Filled 2011-09-20: qty 1

## 2011-09-20 MED ORDER — LORAZEPAM 1 MG PO TABS: 1.0000 mg | ORAL_TABLET | Freq: Three times a day (TID) | ORAL | Status: AC | PRN

## 2011-09-20 MED ORDER — LORAZEPAM 2 MG/ML IJ SOLN
2.0000 mg | Freq: Once | INTRAMUSCULAR | Status: AC
  Administered 2011-09-20: 2 mg via INTRAMUSCULAR
  Filled 2011-09-20: qty 1

## 2011-09-20 NOTE — ED Notes (Signed)
Pt presents to ED today with abd pain that is chronic in nature.  Pt also reports out of ativan rx until wed.

## 2011-09-20 NOTE — ED Provider Notes (Signed)
History     CSN: [id number]  Arrival date & time 09/20/11  0608   None     Chief Complaint  Patient presents with  . Abdominal Pain    (Consider location/radiation/quality/duration/timing/severity/associated sxs/prior treatment) Patient is a 61 y.o. female presenting with abdominal pain and anxiety. The history is provided by the patient.  Abdominal Pain The primary symptoms of the illness include abdominal pain and nausea. The primary symptoms of the illness do not include fever, shortness of breath, vomiting, diarrhea or dysuria. Episode onset: Chronic abdominal pain but worsening over the last 12-24 hours. The onset of the illness was gradual. The problem has not changed since onset. The abdominal pain is generalized. The abdominal pain does not radiate. The abdominal pain is relieved by nothing.  The patient has not had a change in bowel habit. Additional symptoms associated with the illness include anorexia.  Anxiety This is a chronic problem. Episode onset: Worse over the last 4 days since she ran out of Ativan. The problem occurs constantly. The problem has been rapidly worsening. Associated symptoms include abdominal pain. Pertinent negatives include no chest pain and no shortness of breath. She has tried nothing for the symptoms. The treatment provided no relief.    Past Medical History  Diagnosis Date  . Pancreatitis   . COPD (chronic obstructive pulmonary disease)   . Substance abuse   . Cancer     renal ca  . Seizures   . Pancreatitis     Past Surgical History  Procedure Date  . Partial gastrectomy     No family history on file.  History  Substance Use Topics  . Smoking status: Current Every Day Smoker -- 0.5 packs/day    Types: Cigarettes  . Smokeless tobacco: Never Used  . Alcohol Use: Yes     Hx of Alcohol abuse, reports she "still has an occasional beer", but drinks on her medications    OB History    Grav Para Term Preterm Abortions TAB SAB Ect  Mult Living                  Review of Systems  Constitutional: Negative for fever.  Respiratory: Negative for cough and shortness of breath.   Cardiovascular: Negative for chest pain.  Gastrointestinal: Positive for nausea, abdominal pain and anorexia. Negative for vomiting and diarrhea.  Genitourinary: Negative for dysuria.  Neurological: Negative for seizures.  Psychiatric/Behavioral: Positive for decreased concentration. The patient is nervous/anxious and is hyperactive.   All other systems reviewed and are negative.    Allergies  Aspirin and Chlorpromazine hcl  Home Medications   Current Outpatient Rx  Name Route Sig Dispense Refill  . ALBUTEROL SULFATE HFA 108 (90 BASE) MCG/ACT IN AERS Inhalation Inhale 2 puffs into the lungs every 4 (four) hours as needed. For shortness of breath.    Marland Kitchen FLUTICASONE PROPIONATE  HFA 110 MCG/ACT IN AERO Inhalation Inhale 2 puffs into the lungs 2 (two) times daily. 1 Inhaler 0  . HYDROMORPHONE HCL 4 MG PO TABS Oral Take 1 tablet (4 mg total) by mouth every 6 (six) hours as needed. For pain. 20 tablet 0  . LORAZEPAM 1 MG PO TABS Oral Take 1 tablet (1 mg total) by mouth every 6 (six) hours as needed. For anxiety. 30 tablet 0  . ONDANSETRON HCL 4 MG PO TABS Oral Take 4 mg by mouth every 8 (eight) hours as needed. For nausea.      There were no vitals taken for  this visit.  Physical Exam  Nursing note and vitals reviewed. Constitutional: She is oriented to person, place, and time. She appears well-developed and well-nourished. She appears distressed.       Trembling and shaking in the room  HENT:  Head: Normocephalic and atraumatic.  Mouth/Throat: Oropharynx is clear and moist.  Eyes: Conjunctivae normal and EOM are normal. Pupils are equal, round, and reactive to light.  Neck: Normal range of motion. Neck supple.  Cardiovascular: Regular rhythm and intact distal pulses.  Tachycardia present.   No murmur heard. Pulmonary/Chest: Effort normal  and breath sounds normal. No respiratory distress. She has no wheezes. She has no rales.  Abdominal: Soft. Bowel sounds are normal. She exhibits no distension. There is generalized tenderness. There is no rebound and no guarding.       Well-healed midline surgical incision  Musculoskeletal: Normal range of motion. She exhibits no edema and no tenderness.  Neurological: She is alert and oriented to person, place, and time.  Skin: Skin is warm and dry. No rash noted. No erythema.  Psychiatric: She has a normal mood and affect. Her behavior is normal.    ED Course  Procedures (including critical care time)   Labs Reviewed  CBC WITH DIFFERENTIAL  COMPREHENSIVE METABOLIC PANEL  LIPASE, BLOOD   No results found.   No diagnosis found.    MDM   Patient with a history of multiple emergency room visits for pain and anxiety. She has a history of chronic pancreatitis with constant abdominal pain for which she takes Dilaudid 4 mg tablets. Patient presents today because she states she is trembling and very anxious. She normally takes Ativan 1 mg 3 times a day but ran out 4 days ago and states she cannot get it filled until Wednesday. She is tremulous on exam and shaking. She denies any seizures but states she is having worsening abdominal pain and nausea on the last 12-24 hours. On exam her abdomen is tender and soft diffusely. The rest of her exam is unremarkable. CBC, CMP, lipase pending. Patient given IM Ativan   Pt was checked out to Dr. Judd Lien at 0700     Gwyneth Sprout, MD 09/22/11 [phone]

## 2011-09-20 NOTE — ED Notes (Signed)
Patient reports that she took 8mg  po dilaudid PTA without relief, has been out of Ativan x 4 days.

## 2011-09-20 NOTE — ED Notes (Signed)
Pt presents to ED today with chronic c/o upper abd pain.  Pt is well known in the ED for same.  Pt reports out of po Ativan and will not see MD until Wed.

## 2011-09-20 NOTE — ED Notes (Signed)
Patient arrived by EMS for upper abdominal pain with nausea and vomiting. EMS reports that patient has had no vomiting enroute and took 2mg  po dilaudid without relief.

## 2011-09-20 NOTE — ED Provider Notes (Signed)
Care assumed from Dr. Anitra Lauth at shift change.  I agree with her note, assessment, and plan.  This patient has a history of chronic abd pain due to pancreatitis.  She is experiencing a flare up of this and she has also run out of the ativan that she takes three times daily.  She is shaky, anxious, and cannot get more until two days from now.  Her abd exam is benign, is afebrile, and the labs do not suggest an acute process.  She is feeling better with the meds given and will discharged.  I will prescribe her a few more ativan as she seems to be having symptoms of withdrawal.  I have advised her to be cautious of running out of the medications in the future.    Geoffery Lyons, MD 09/20/11 [phone]

## 2011-09-24 IMAGING — CR DG HIP (WITH OR WITHOUT PELVIS) 2-3V*L*
4 series · 4 of 4 positions shown · non-contrast
Comparison: 07/31/2006

CLINICAL DATA: Fall.  Pain.

LEFT HIP - COMPLETE 2+ VIEW

[t pelvis a.p.]
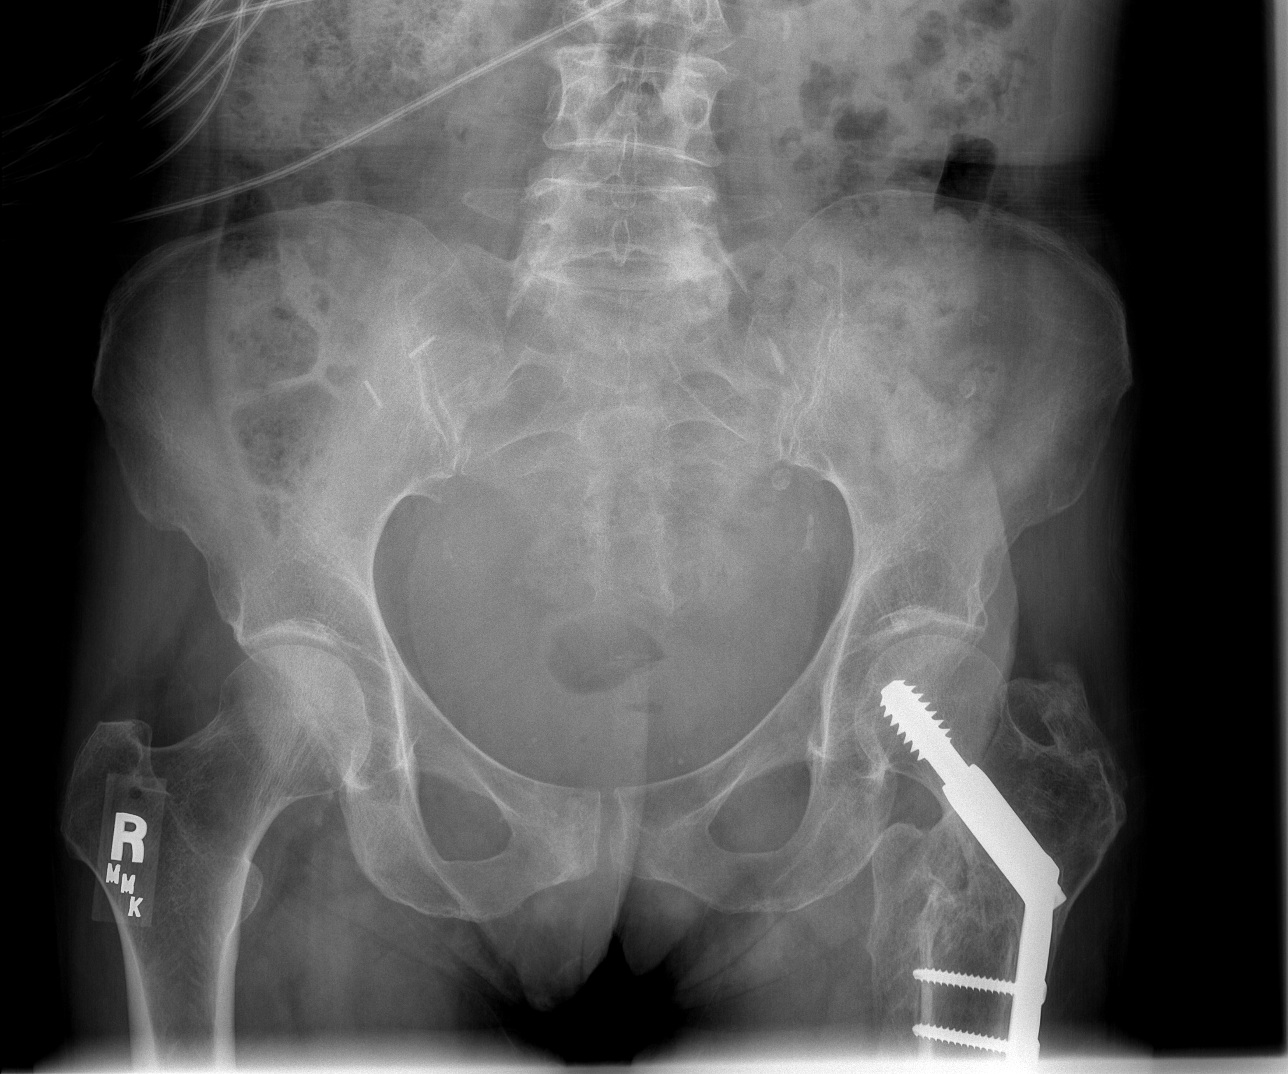

[t hip ap left]
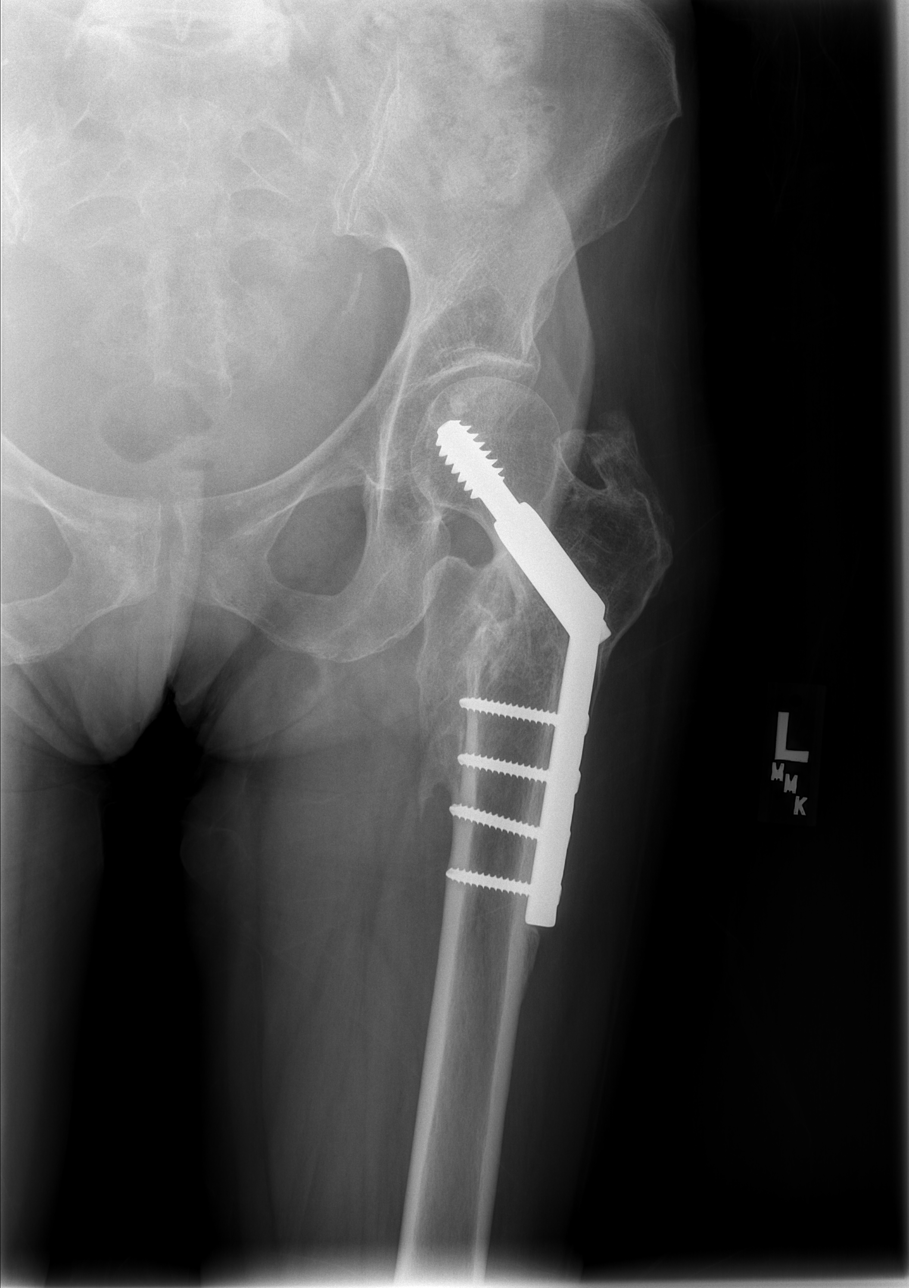

[t hip frog leg left (1 of 2)]
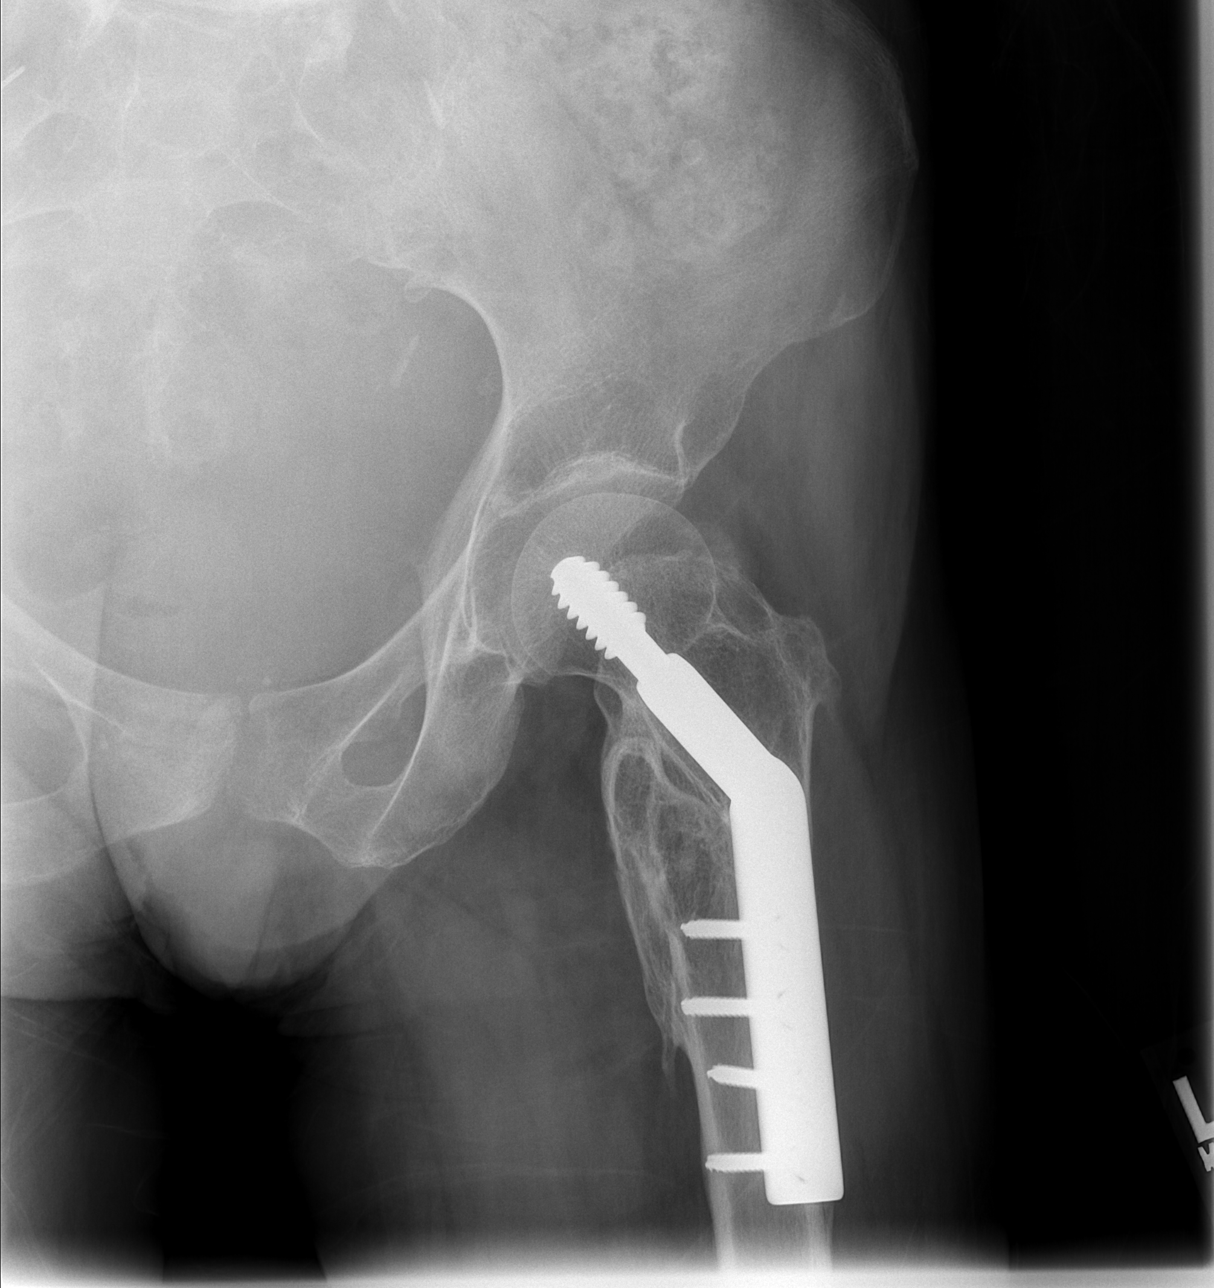

[t hip frog leg left (2 of 2)]
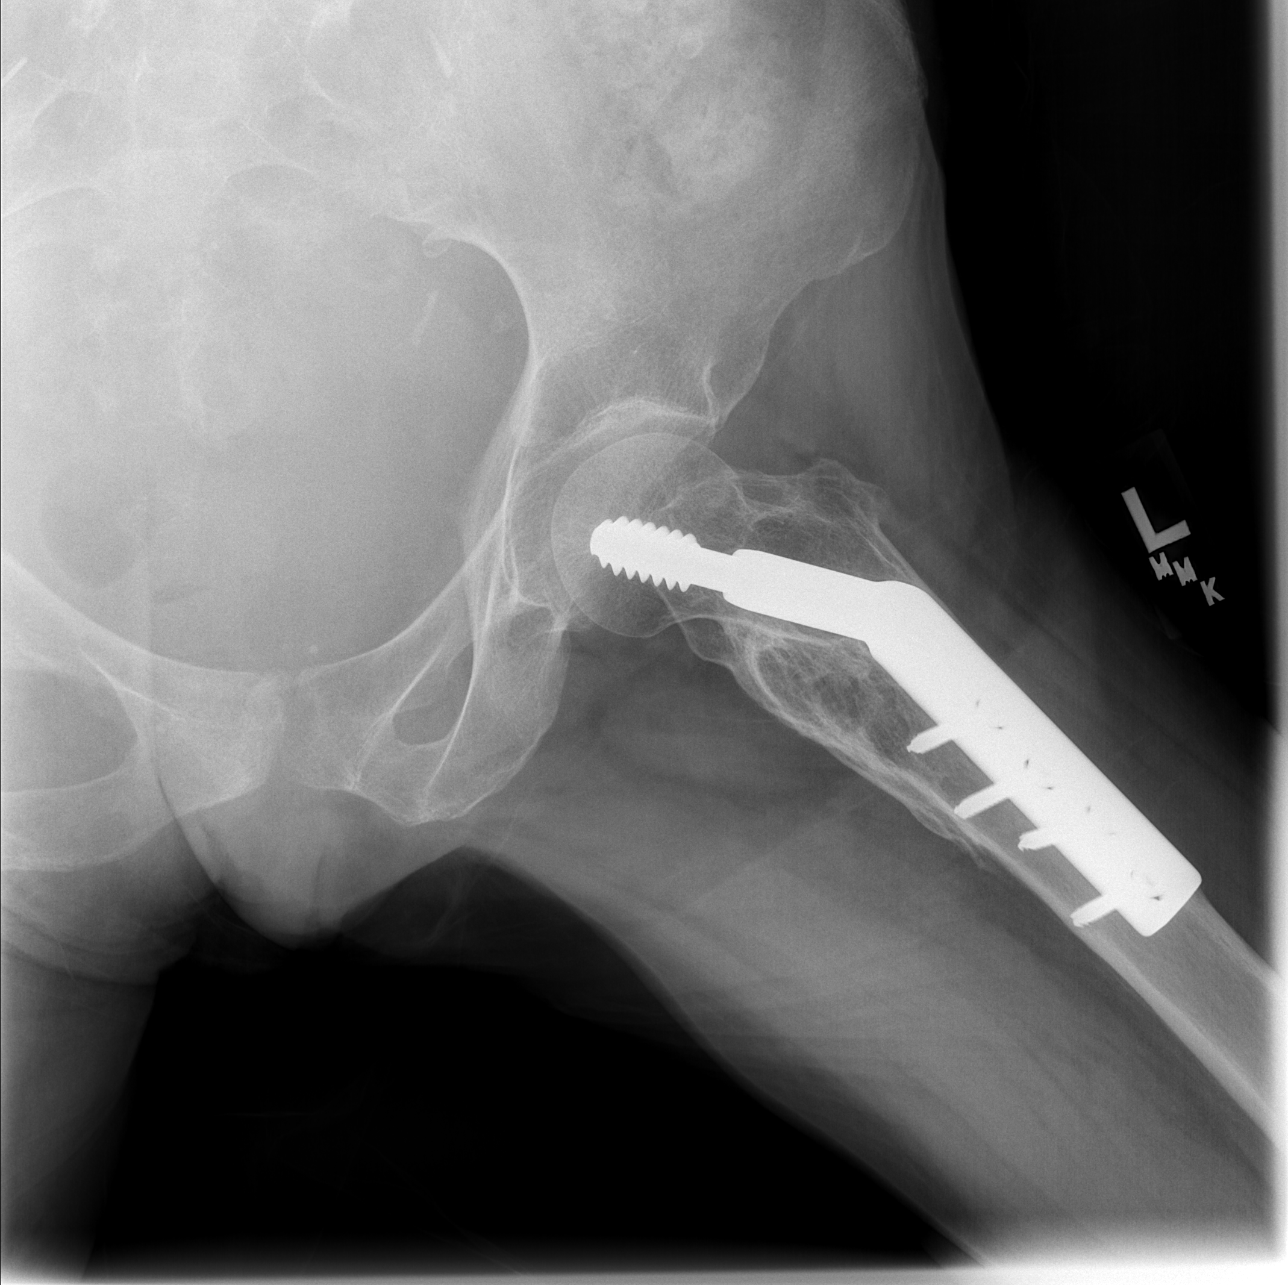

[4 of 4 positions shown; findings below may reference images not displayed]

FINDINGS: There is been previous ORIF for intertrochanteric
fracture.  There is been healing in the region.  No evidence of
recurrent fracture.  No degenerative change of the hip.  No
fracture of the pelvis is evident.
IMPRESSION: No acute finding.  Previous ORIF for intertrochanteric fracture.
No evidence of reinjury.

## 2011-10-06 ENCOUNTER — Other Ambulatory Visit: Payer: Self-pay | Admitting: Interventional Radiology

## 2011-10-06 ENCOUNTER — Telehealth: Payer: Self-pay | Admitting: Emergency Medicine

## 2011-10-06 ENCOUNTER — Other Ambulatory Visit (HOSPITAL_COMMUNITY): Payer: Self-pay | Admitting: Interventional Radiology

## 2011-10-06 DIAGNOSIS — N2889 Other specified disorders of kidney and ureter: Secondary | ICD-10-CM

## 2011-10-06 DIAGNOSIS — C649 Malignant neoplasm of unspecified kidney, except renal pelvis: Secondary | ICD-10-CM

## 2011-10-06 DIAGNOSIS — I878 Other specified disorders of veins: Secondary | ICD-10-CM

## 2011-10-06 NOTE — Telephone Encounter (Signed)
Pt. Call with c/c of "stabbing pain" on her left flank area.  Still having N & V , still taking meds as directed.  I told pt. She was due for another scan anyway., so she is scheduled with Dr. Fredia Sorrow only on 10-14-11 for PICC line at 300pm and CT to follow after , then pull PICC.  I made Dr. Fredia Sorrow aware.

## 2011-10-13 ENCOUNTER — Telehealth (HOSPITAL_COMMUNITY): Payer: Self-pay | Admitting: Interventional Radiology

## 2011-10-14 ENCOUNTER — Ambulatory Visit (HOSPITAL_COMMUNITY): Payer: Medicaid Other

## 2011-10-28 ENCOUNTER — Ambulatory Visit (HOSPITAL_COMMUNITY)
Admission: RE | Admit: 2011-10-28 | Discharge: 2011-10-28 | Disposition: A | Discharge status: Home / Self Care | Payer: Medicaid Other | Source: Ambulatory Visit | Attending: Interventional Radiology | Admitting: Interventional Radiology

## 2011-10-28 ENCOUNTER — Other Ambulatory Visit (HOSPITAL_COMMUNITY): Payer: Self-pay | Admitting: Interventional Radiology

## 2011-10-28 ENCOUNTER — Other Ambulatory Visit (HOSPITAL_COMMUNITY): Payer: PRIVATE HEALTH INSURANCE

## 2011-10-28 ENCOUNTER — Other Ambulatory Visit: Payer: Self-pay | Admitting: Interventional Radiology

## 2011-10-28 DIAGNOSIS — N2889 Other specified disorders of kidney and ureter: Secondary | ICD-10-CM

## 2011-10-28 DIAGNOSIS — I878 Other specified disorders of veins: Secondary | ICD-10-CM

## 2011-10-28 DIAGNOSIS — Z5189 Encounter for other specified aftercare: Secondary | ICD-10-CM | POA: Insufficient documentation

## 2011-10-28 DIAGNOSIS — C649 Malignant neoplasm of unspecified kidney, except renal pelvis: Secondary | ICD-10-CM

## 2011-10-28 DIAGNOSIS — Z85528 Personal history of other malignant neoplasm of kidney: Secondary | ICD-10-CM | POA: Insufficient documentation

## 2011-10-28 MED ORDER — PROMETHAZINE HCL 25 MG/ML IJ SOLN
INTRAMUSCULAR | Status: AC
  Filled 2011-10-28: qty 1

## 2011-10-28 MED ORDER — LIDOCAINE HCL 1 % IJ SOLN
INTRAMUSCULAR | Status: AC
  Filled 2011-10-28: qty 20

## 2011-10-28 MED ORDER — IOHEXOL 300 MG/ML  SOLN
100.0000 mL | Freq: Once | INTRAMUSCULAR | Status: AC | PRN
  Administered 2011-10-28: 100 mL via INTRAVENOUS

## 2011-10-28 MED ORDER — PROMETHAZINE HCL 25 MG/ML IJ SOLN
25.0000 mg | Freq: Once | INTRAMUSCULAR | Status: AC
  Administered 2011-10-28: 25 mg via INTRAVENOUS

## 2011-10-28 NOTE — Procedures (Signed)
Procedure:  Left arm PICC line placement Findings:  39 cm SL Power PICC via basilic v.  Tip at cavoatrial junction. OK to use.

## 2011-11-09 ENCOUNTER — Inpatient Hospital Stay: Admission: RE | Admit: 2011-11-09 | Payer: Medicaid Other | Source: Ambulatory Visit

## 2011-11-09 NOTE — Progress Notes (Signed)
Patient ID: [name], female   DOB: 1950-10-11, 61 y.o.   MRN: [id number]  CT reviewed from 10/28/11.  Shows stable ablation defect of left kidney after prior cryoablation of a renal cell carcinoma recurrence on 05/15/10.  Original RFA of the mass was performed on 01/18/07.  No new lesions are identified.  I personally spoke to the patient's husband [name] at 15:50 hours today to let him know the results.  I recommended another follow up scan in 2 years.

## 2011-12-20 ENCOUNTER — Emergency Department (HOSPITAL_COMMUNITY): Payer: Medicaid Other

## 2011-12-20 ENCOUNTER — Emergency Department (HOSPITAL_COMMUNITY)
Admission: EM | Admit: 2011-12-20 | Discharge: 2011-12-20 | Disposition: A | Discharge status: Home / Self Care | Payer: Medicaid Other | Attending: Emergency Medicine | Admitting: Emergency Medicine

## 2011-12-20 ENCOUNTER — Encounter (HOSPITAL_COMMUNITY): Payer: Self-pay | Admitting: Emergency Medicine

## 2011-12-20 DIAGNOSIS — X500XXA Overexertion from strenuous movement or load, initial encounter: Secondary | ICD-10-CM | POA: Insufficient documentation

## 2011-12-20 DIAGNOSIS — J4489 Other specified chronic obstructive pulmonary disease: Secondary | ICD-10-CM | POA: Insufficient documentation

## 2011-12-20 DIAGNOSIS — S99919A Unspecified injury of unspecified ankle, initial encounter: Secondary | ICD-10-CM | POA: Insufficient documentation

## 2011-12-20 DIAGNOSIS — S99911A Unspecified injury of right ankle, initial encounter: Secondary | ICD-10-CM

## 2011-12-20 DIAGNOSIS — G40909 Epilepsy, unspecified, not intractable, without status epilepticus: Secondary | ICD-10-CM | POA: Insufficient documentation

## 2011-12-20 DIAGNOSIS — C649 Malignant neoplasm of unspecified kidney, except renal pelvis: Secondary | ICD-10-CM | POA: Insufficient documentation

## 2011-12-20 DIAGNOSIS — R112 Nausea with vomiting, unspecified: Secondary | ICD-10-CM | POA: Insufficient documentation

## 2011-12-20 DIAGNOSIS — F1910 Other psychoactive substance abuse, uncomplicated: Secondary | ICD-10-CM | POA: Insufficient documentation

## 2011-12-20 DIAGNOSIS — J449 Chronic obstructive pulmonary disease, unspecified: Secondary | ICD-10-CM | POA: Insufficient documentation

## 2011-12-20 DIAGNOSIS — Y929 Unspecified place or not applicable: Secondary | ICD-10-CM | POA: Insufficient documentation

## 2011-12-20 DIAGNOSIS — S8990XA Unspecified injury of unspecified lower leg, initial encounter: Secondary | ICD-10-CM | POA: Insufficient documentation

## 2011-12-20 DIAGNOSIS — K861 Other chronic pancreatitis: Secondary | ICD-10-CM | POA: Insufficient documentation

## 2011-12-20 DIAGNOSIS — F17200 Nicotine dependence, unspecified, uncomplicated: Secondary | ICD-10-CM | POA: Insufficient documentation

## 2011-12-20 DIAGNOSIS — Y9301 Activity, walking, marching and hiking: Secondary | ICD-10-CM | POA: Insufficient documentation

## 2011-12-20 MED ORDER — ONDANSETRON 4 MG PO TBDP
4.0000 mg | ORAL_TABLET | Freq: Once | ORAL | Status: AC
  Administered 2011-12-20: 4 mg via ORAL
  Filled 2011-12-20: qty 1

## 2011-12-20 MED ORDER — OXYCODONE-ACETAMINOPHEN 5-325 MG PO TABS
1.0000 | ORAL_TABLET | Freq: Once | ORAL | Status: AC
  Administered 2011-12-20: 1 via ORAL
  Filled 2011-12-20: qty 1

## 2011-12-20 NOTE — ED Provider Notes (Signed)
History     CSN: [id number]  Arrival date & time 12/20/11  1109   First MD Initiated Contact with Patient 12/20/11 1110      No chief complaint on file.   (Consider location/radiation/quality/duration/timing/severity/associated sxs/prior treatment) HPI  61 year old female with history of polysubstance abuse, pancreatitis, and cancer presents complaining of ankle pain. Patient reports last night she was reaching for talk, loss of footing and fell forward. States of right foot buckle underneath and complained of immediate pain to her right ankle and right foot. Pain is sharp, moderate in severity, worsened in the back of her foot, radiates up to her ankle. She was unable to bear any weight on her foot afterward. Did not hit her head, loss of consciousness. Denies any other pain. Denies numbness or weakness or abnormal bleeding. Patient has tried soaking foot in hot water, and taking Aleve without relief. She is currently complaining of pain 8/10, with nausea.  Past Medical History  Diagnosis Date  . Pancreatitis   . COPD (chronic obstructive pulmonary disease)   . Substance abuse   . Cancer     renal ca  . Seizures   . Pancreatitis     Past Surgical History  Procedure Date  . Partial gastrectomy     No family history on file.  History  Substance Use Topics  . Smoking status: Current Every Day Smoker -- 0.5 packs/day    Types: Cigarettes  . Smokeless tobacco: Never Used  . Alcohol Use: Yes     Comment: Hx of Alcohol abuse, reports she "still has an occasional beer", but drinks on her medications    OB History    Grav Para Term Preterm Abortions TAB SAB Ect Mult Living                  Review of Systems  Constitutional: Negative for fever.  Gastrointestinal: Positive for nausea.  Musculoskeletal: Positive for arthralgias. Negative for joint swelling.  Skin: Negative for rash and wound.  Neurological: Negative for numbness.    Allergies  Aspirin and  Chlorpromazine hcl  Home Medications   Current Outpatient Rx  Name  Route  Sig  Dispense  Refill  . ALBUTEROL SULFATE HFA 108 (90 BASE) MCG/ACT IN AERS   Inhalation   Inhale 2 puffs into the lungs every 4 (four) hours as needed. For shortness of breath.         Marland Kitchen FLUTICASONE PROPIONATE  HFA 110 MCG/ACT IN AERO   Inhalation   Inhale 2 puffs into the lungs 2 (two) times daily.   1 Inhaler   0   . HYDROMORPHONE HCL 4 MG PO TABS   Oral   Take 1 tablet (4 mg total) by mouth every 6 (six) hours as needed. For pain.   20 tablet   0   . LORAZEPAM 1 MG PO TABS   Oral   Take 1 tablet (1 mg total) by mouth every 6 (six) hours as needed. For anxiety.   30 tablet   0   . ONDANSETRON HCL 4 MG PO TABS   Oral   Take 4 mg by mouth every 8 (eight) hours as needed. For nausea.           There were no vitals taken for this visit.  Physical Exam  Nursing note and vitals reviewed. Constitutional: She is oriented to person, place, and time. No distress.       Thin-appearing female appears older than her stated age  HENT:  Head: Normocephalic and atraumatic.  Eyes: Conjunctivae normal are normal.  Neck: Neck supple.  Musculoskeletal:       Right knee: Normal.       Right ankle: She exhibits decreased range of motion. She exhibits no swelling, no ecchymosis, no deformity, no laceration and normal pulse. tenderness. Lateral malleolus, AITFL, posterior TFL and head of 5th metatarsal tenderness found. No medial malleolus, no CF ligament and no proximal fibula tenderness found. Achilles tendon normal.       Right foot: She exhibits tenderness and bony tenderness. She exhibits normal range of motion, no swelling, normal capillary refill, no crepitus, no deformity and no laceration.  Neurological: She is alert and oriented to person, place, and time.  Skin: No rash noted.    ED Course  Procedures (including critical care time)  Labs Reviewed - No data to display No results found.   No  diagnosis found.  Dg Ankle Complete Right  12/20/2011  *RADIOLOGY REPORT*  Clinical Data: Fall.  Posterior ankle pain.  RIGHT ANKLE - COMPLETE 3+ VIEW  Comparison: None.  Findings: Ankle joint is located.  Moderate osteopenia is present. No acute bone or soft tissue abnormality is present.  IMPRESSION:  1.  No acute abnormality. 2.  Moderate osteopenia.   Original Report Authenticated By: Marin Roberts, M.D.    Dg Foot Complete Right  12/20/2011  *RADIOLOGY REPORT*  Clinical Data: Fall.  Posterior ankle pain.  RIGHT FOOT COMPLETE - 3+ VIEW  Comparison: None.  Findings: Moderate osteopenia is evident.  Minimal degenerative changes are present within the hind foot.  No acute abnormality is present.  IMPRESSION:  1.  No acute abnormality. 2.  Moderate osteopenia.   Original Report Authenticated By: Marin Roberts, M.D.     1. R ankle pain 2. R foot pain  MDM  Patient reports she injured her right ankle and foot when falling forward and has not been able to bear weight on it since. Initial exam reveals no evidence of crepitus, deformity, or bruising, or edema. Pain medication and antinausea medication given here, we'll x-ray affected area. She does have a history of drug abuse.  12:05 PM X-rays of right foot and ankle is unremarkable. Moderate osteopenia noted. Result discussed with patient. Due to the fact that the patient has history of substance abuse, I do not feel comfortable prescribing any narcotic pain medication. I recommend Rice therapy and over-the-counter medications. ASO and crutches offered.    BP 152/136  Pulse 116  Temp 98.9 F (37.2 C) (Oral)  Resp 20  SpO2 96% tachycardia likely from anxiety and pain.    I have reviewed nursing notes and vital signs. I personally reviewed the imaging tests through PACS system  I reviewed available ER/hospitalization records thought the EMR    Fayrene Helper, [place] 12/20/11 1212

## 2011-12-20 NOTE — ED Notes (Signed)
Pt states she fell on her steps last night and hit her right ankle. Pt states she hasn't been able to bear weight on it since.

## 2011-12-20 NOTE — ED Provider Notes (Signed)
Medical screening examination/treatment/procedure(s) were performed by non-physician practitioner and as supervising physician I was immediately available for consultation/collaboration.   Richardean Canal, MD 12/20/11 [phone]

## 2012-02-20 IMAGING — CR DG ABDOMEN 1V
1 series · 1 of 1 positions shown · non-contrast
Comparison: Abdomen films of 02/19/2009

CLINICAL DATA: Mid abdominal pain, constipation for 3 weeks

ABDOMEN - 1 VIEW

[t abdomen supine]
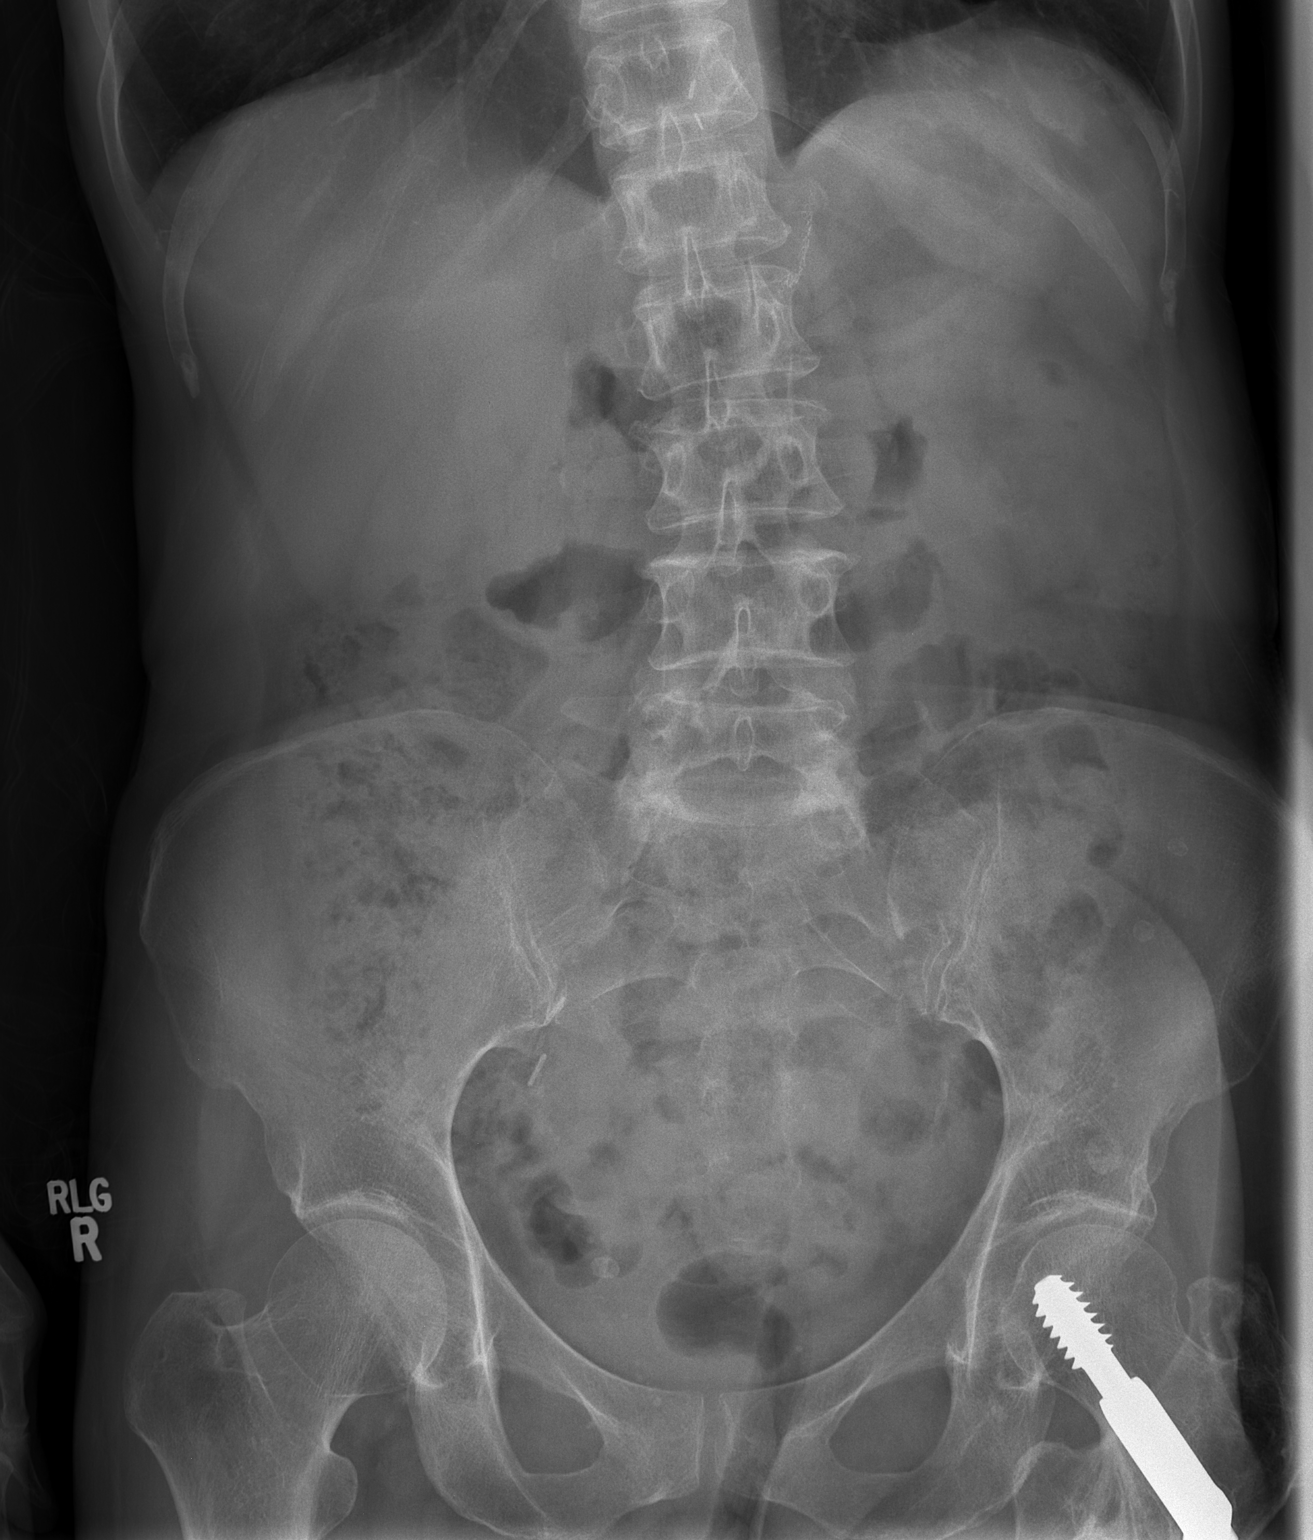

[1 of 1 positions shown; findings below may reference images not displayed]

FINDINGS: A supine view of the abdomen shows a nonspecific bowel
gas pattern.  No obstruction is seen.  No opaque calculi are noted.
The bones appear slightly osteopenic with mild lumbar scoliosis
convex to the left. Prior left hip pinning is noted.
IMPRESSION: Nonspecific bowel gas pattern.  No obstruction

## 2012-06-23 NOTE — Telephone Encounter (Signed)
e

## 2012-07-05 IMAGING — CT CT HEAD W/O CM
1 of 2 series · 16 of 30 positions shown, 20 images · non-contrast
Comparison: None.

CLINICAL DATA: Code stroke.  Right-sided weakness.

CT HEAD WITHOUT CONTRAST
TECHNIQUE: Contiguous axial images were obtained from the base of
the skull through the vertex without contrast.

[Series 3: head trauma 2.4 h60s · axial · 0.46mm/px · z∈[-156,-4]mm · 16 of 72 slices shown, 20 images]
[im 4/72  brain]
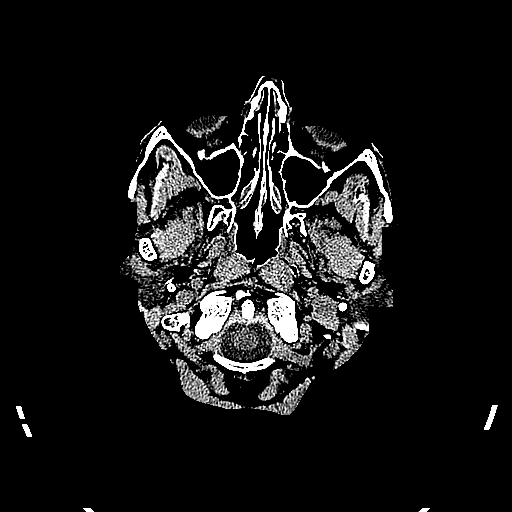
[im 4/72  bone]
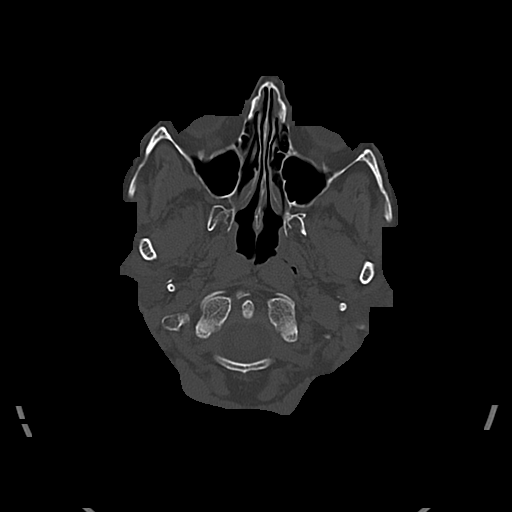
[im 8/72  brain]
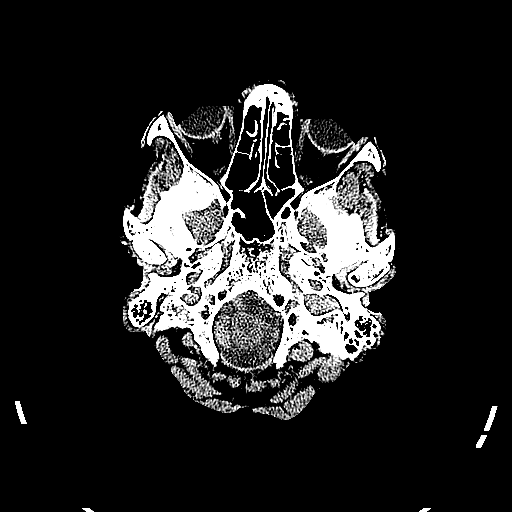
[im 12/72  brain]
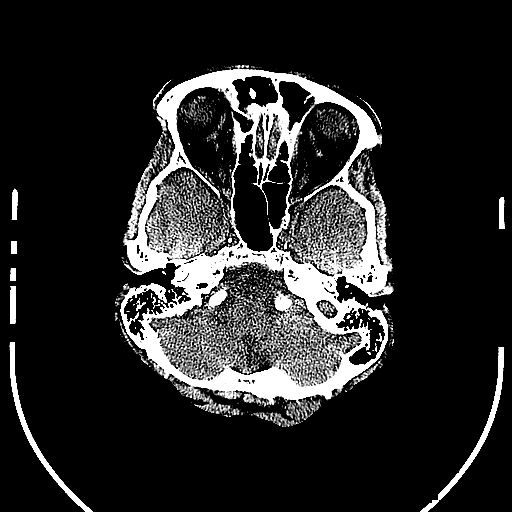
[im 15/72  brain]
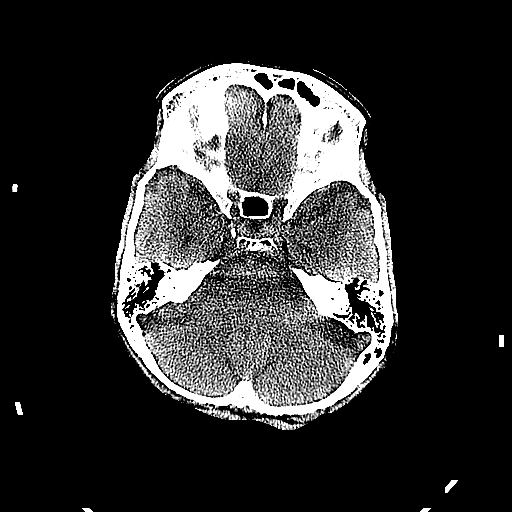
[im 23/72  brain]
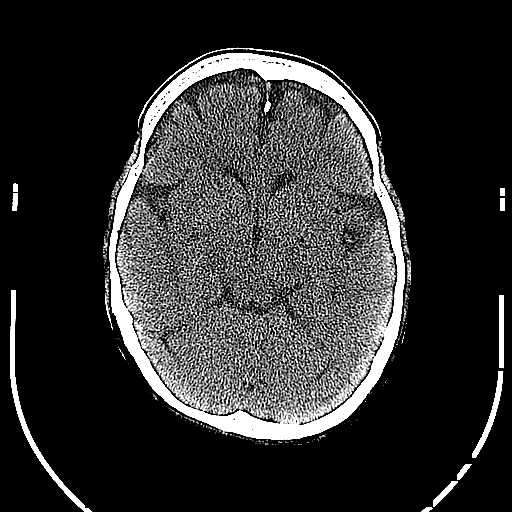
[im 23/72  bone]
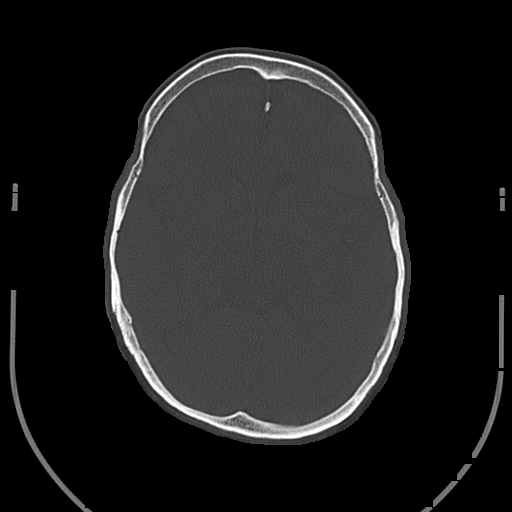
[im 27/72  brain]
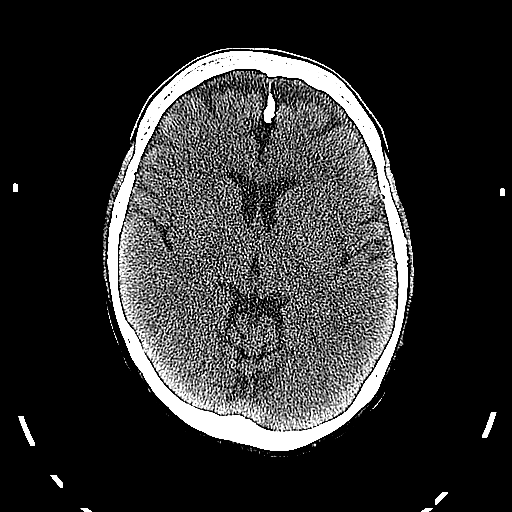
[im 30/72  brain]
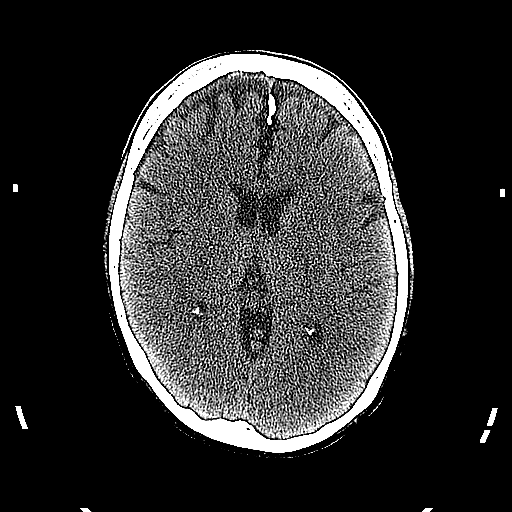
[im 34/72  brain]
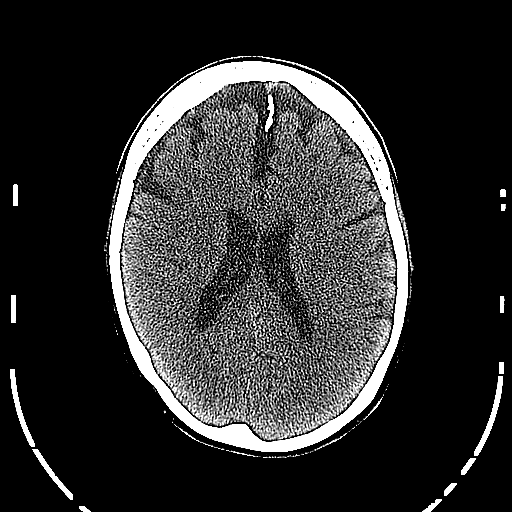
[im 38/72  brain]
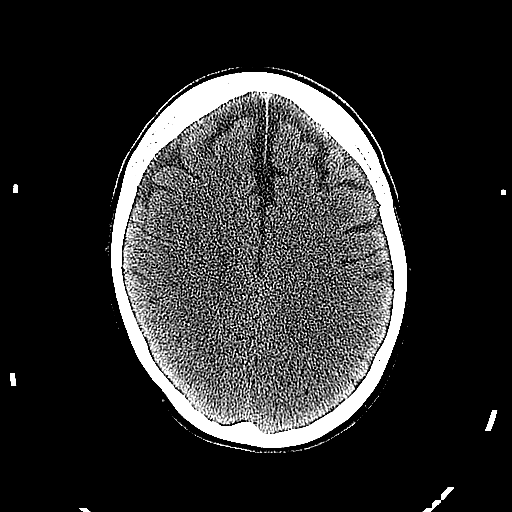
[im 38/72  bone]
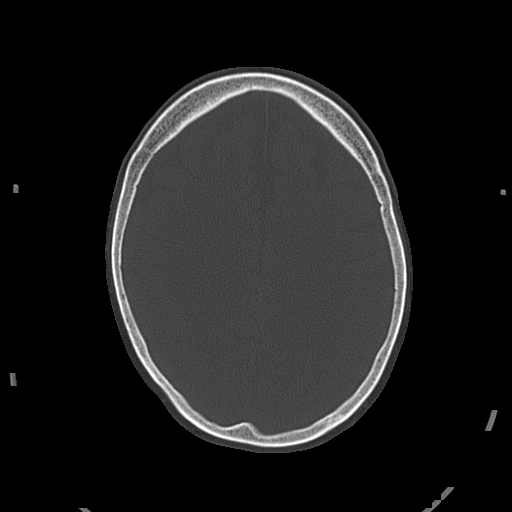
[im 42/72  brain]
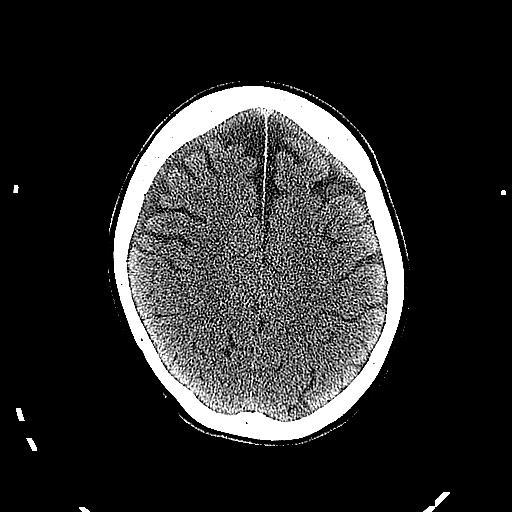
[im 45/72  brain]
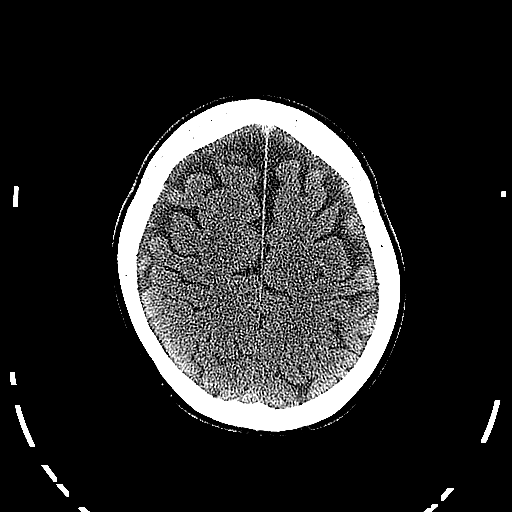
[im 49/72  brain]
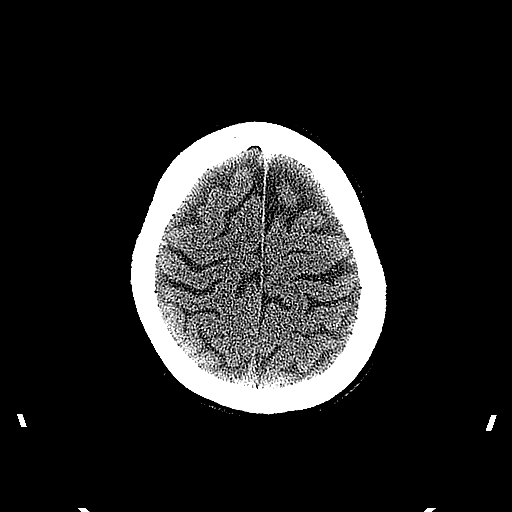
[im 57/72  brain]
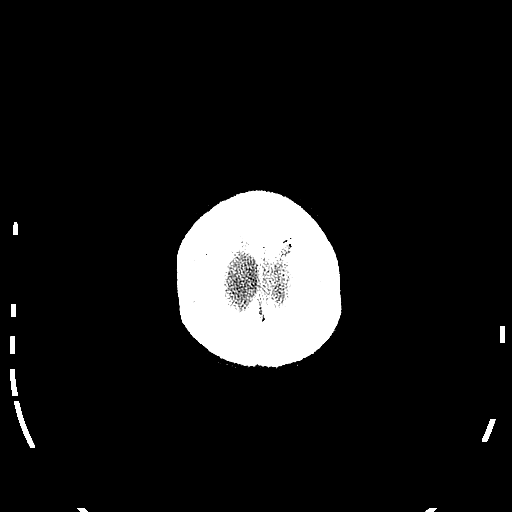
[im 57/72  bone]
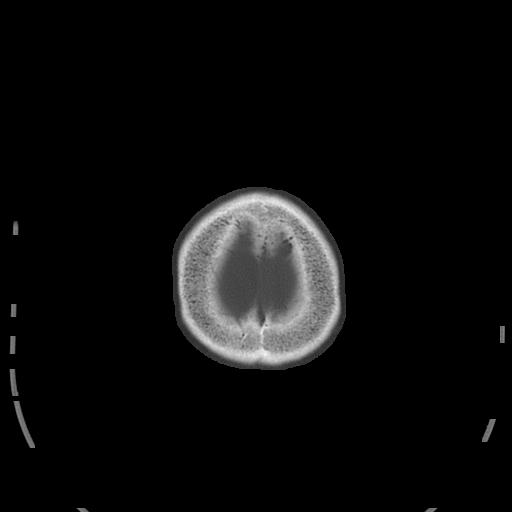
[im 60/72  brain]
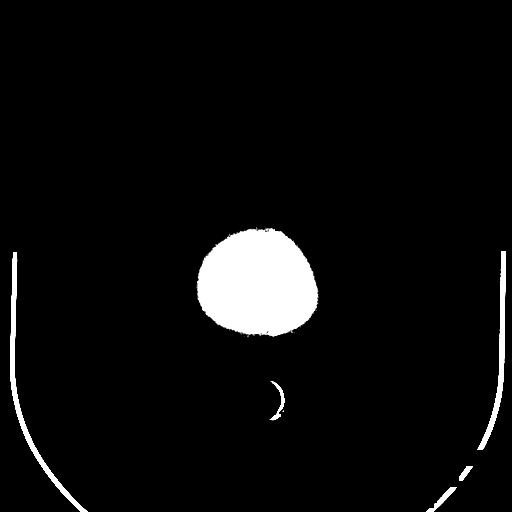
[im 64/72  brain]
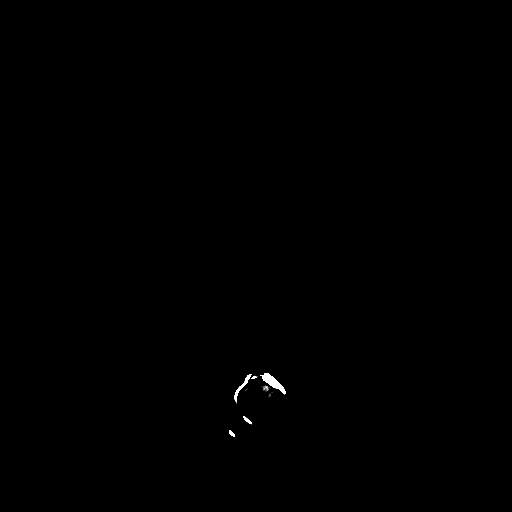
[im 68/72  brain]
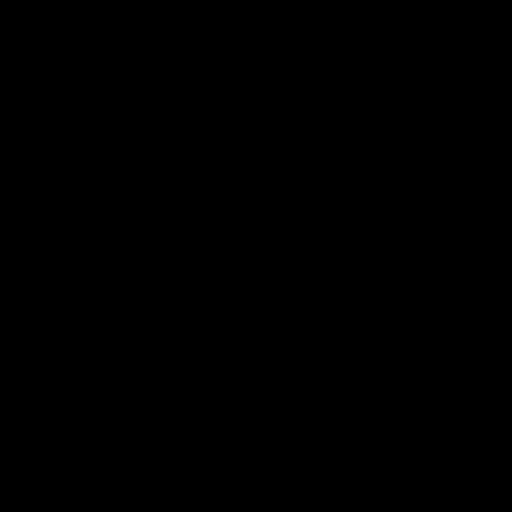

[16 of 30 positions shown; findings below may reference images not displayed]

FINDINGS: There is mild cerebral atrophy. No acute intracranial
abnormality.  Specifically, no hemorrhage, hydrocephalus, mass
lesion, acute infarction, or significant intracranial injury.  No
acute calvarial abnormality. Visualized paranasal sinuses and
mastoids clear.  Orbital soft tissues unremarkable.
IMPRESSION: Mild atrophy. No acute intracranial abnormality.

## 2012-07-05 IMAGING — MR MR HEAD W/O CM
6 of 8 series · 31 of 48 positions shown · non-contrast
Comparison: CT head without contrast 04/21/2009.

CLINICAL DATA: Code stroke.  Left arm numbness and pain.

MRI HEAD WITHOUT CONTRAST
TECHNIQUE: Multiplanar, multiecho pulse sequences of the brain and
surrounding structures were obtained according to standard protocol
without intravenous contrast.

[Series 3: T1 · sagittal · 5.0mm · 0.47mm/px · 1 of 12 slices shown]
[im 1/12]
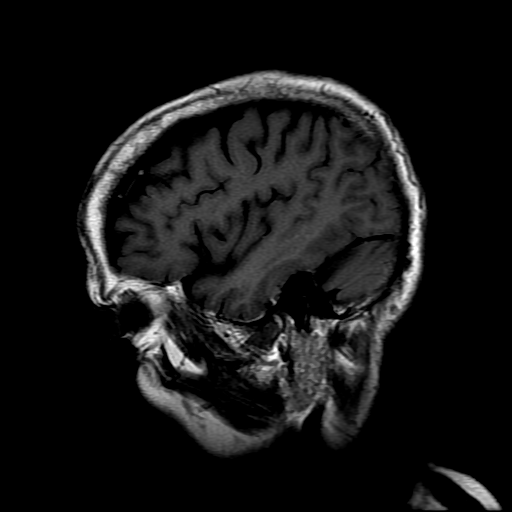

[Series 4: DWI · axial · 5.0mm · 1.09mm/px · z∈[-80,+51]mm · 11 of 54 slices shown (1 of 2)]
[im 1/54]
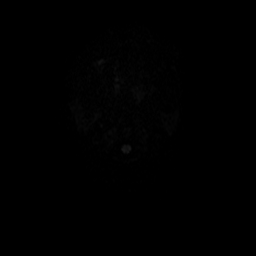
[im 6/54]
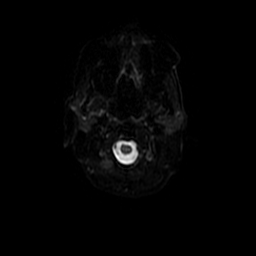
[im 11/54]
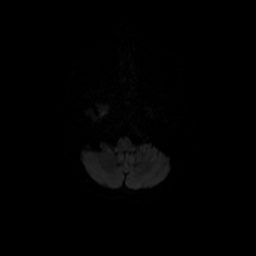
[im 16/54]
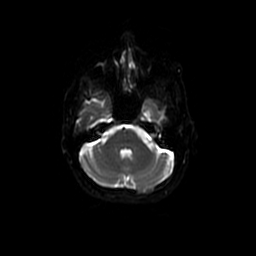
[im 22/54]
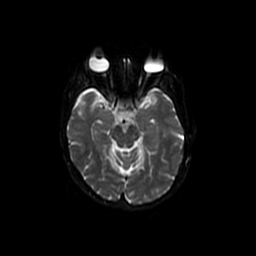
[im 27/54]
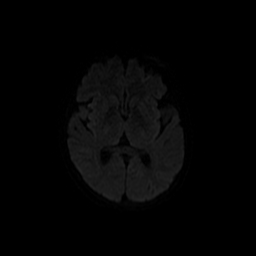
[im 32/54]
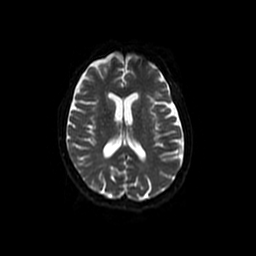
[im 38/54]
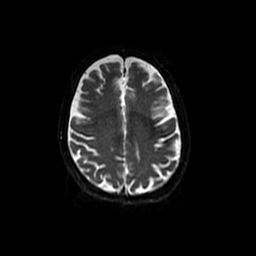
[im 43/54]
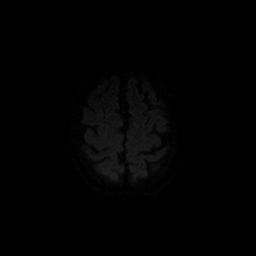
[im 48/54]
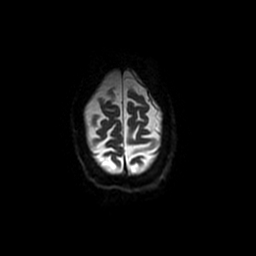
[im 54/54]
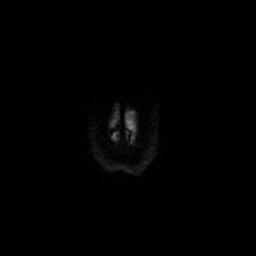

[Series 5: T2 · axial · 5.0mm · 0.43mm/px · z∈[-51,+75]mm · 4 of 20 slices shown (1 of 2)]
[im 1/20]
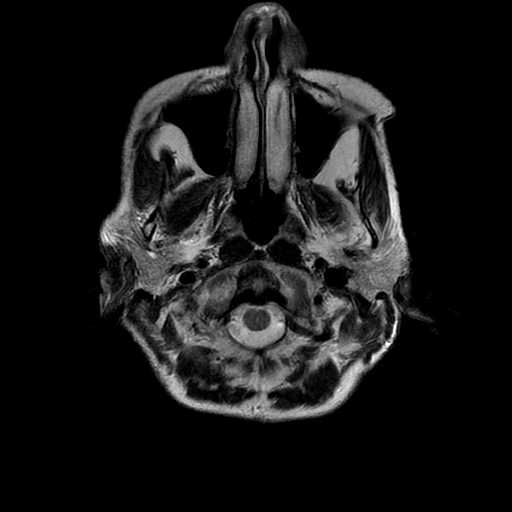
[im 7/20]
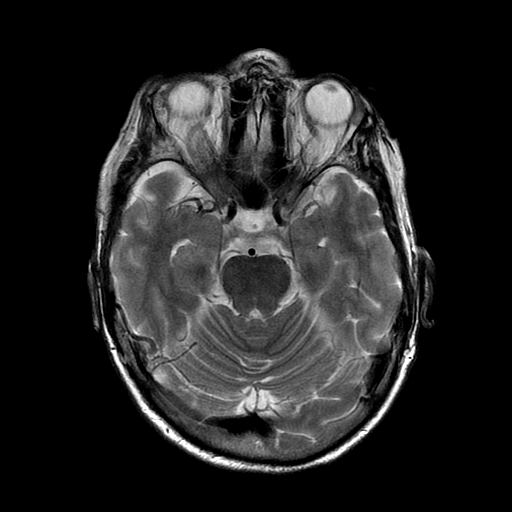
[im 13/20]
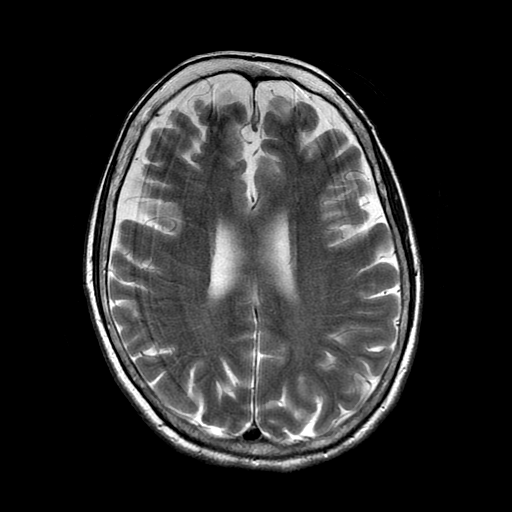
[im 20/20]
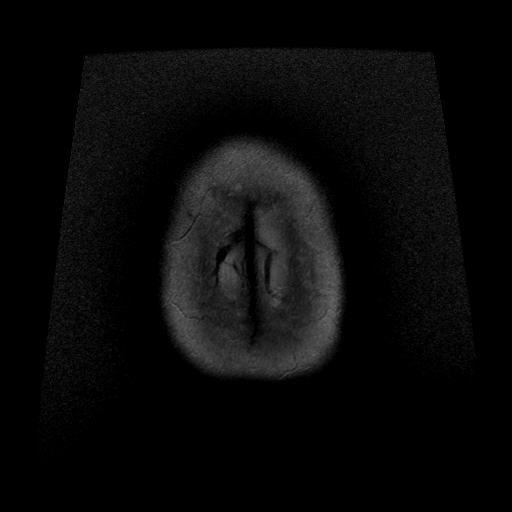

[Series 6: FLAIR · axial · 5.0mm · 0.43mm/px · z∈[-51,+75]mm · 4 of 20 slices shown]
[im 1/20]
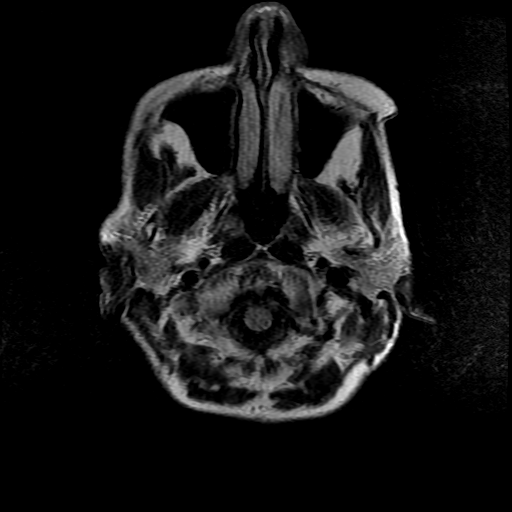
[im 7/20]
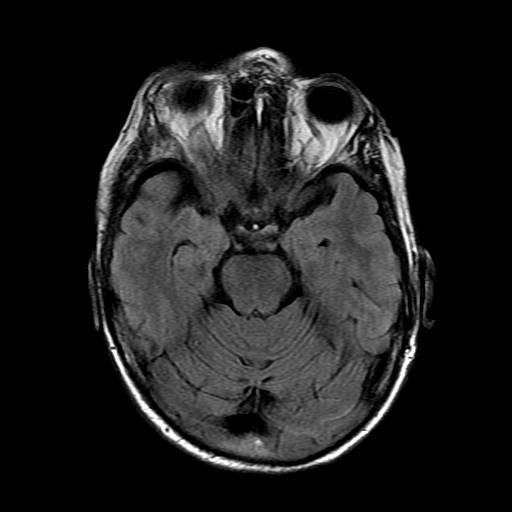
[im 13/20]
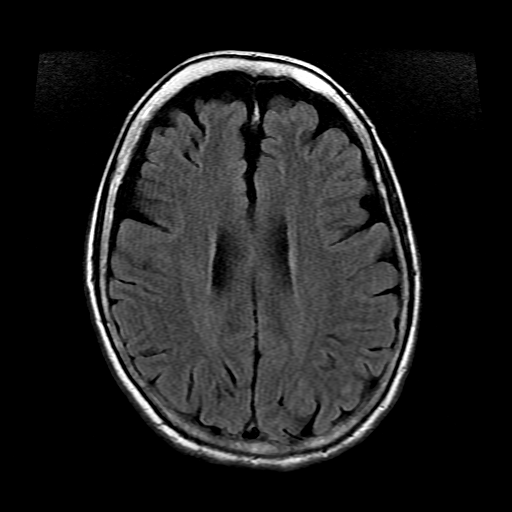
[im 20/20]
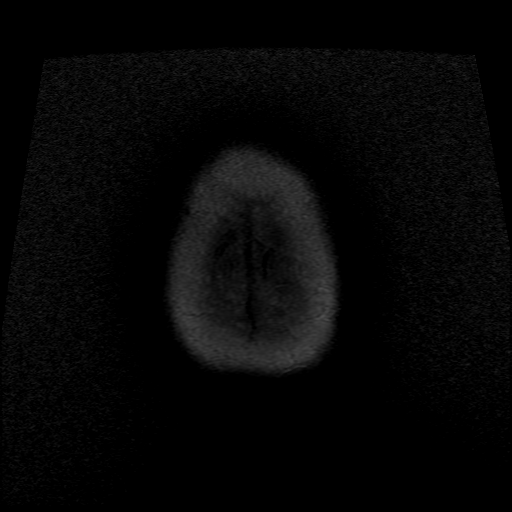

[Series 9: T2 · coronal · 5.0mm · 0.43mm/px · 6 of 28 slices shown (2 of 2)]
[im 1/28]
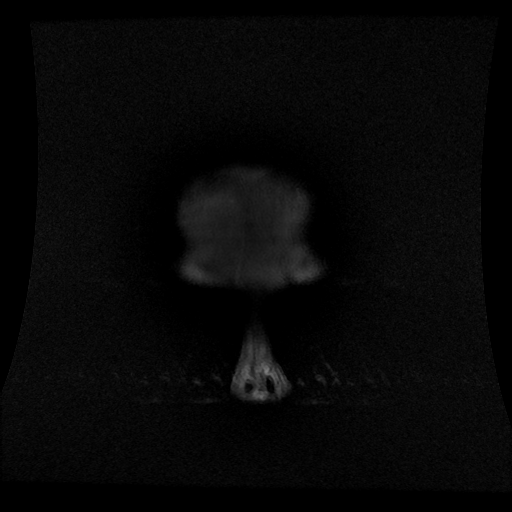
[im 6/28]
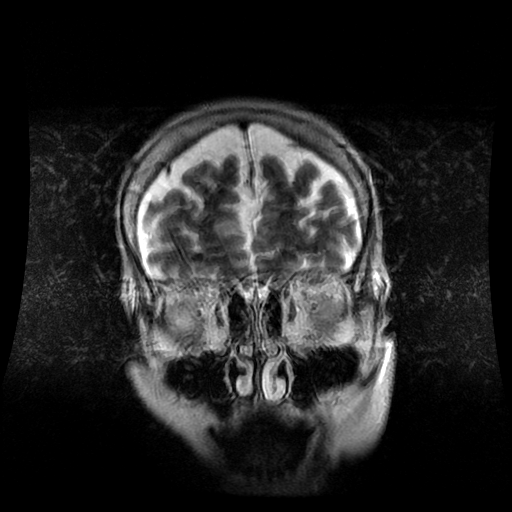
[im 11/28]
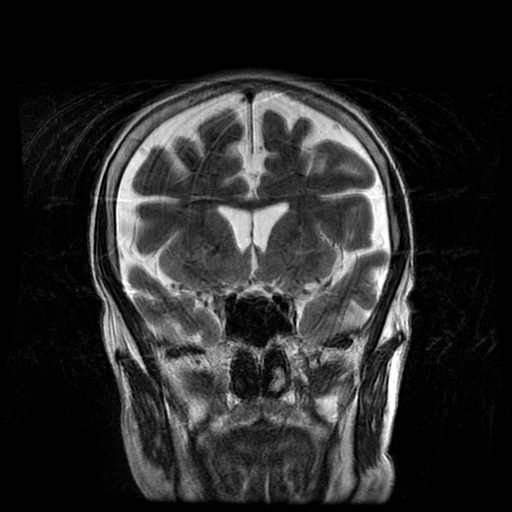
[im 17/28]
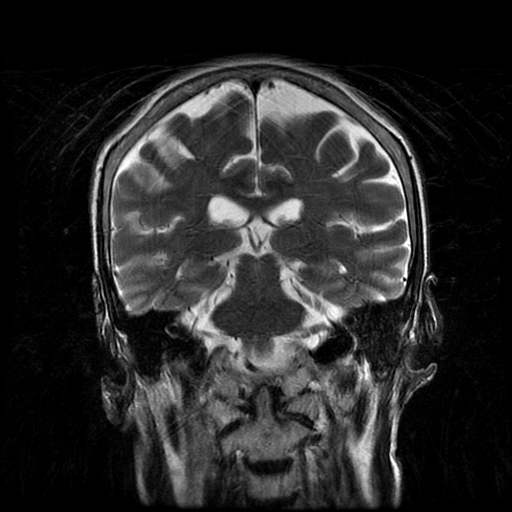
[im 22/28]
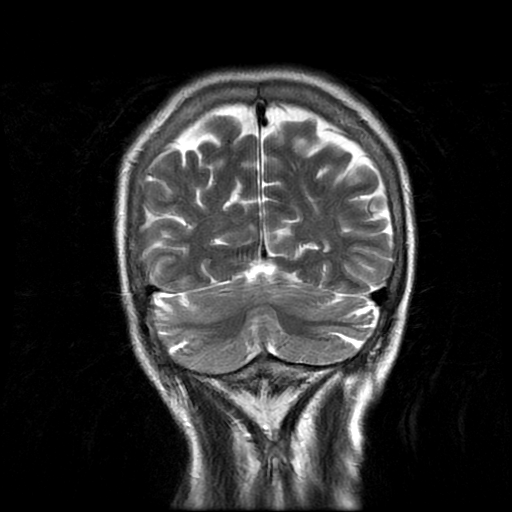
[im 28/28]
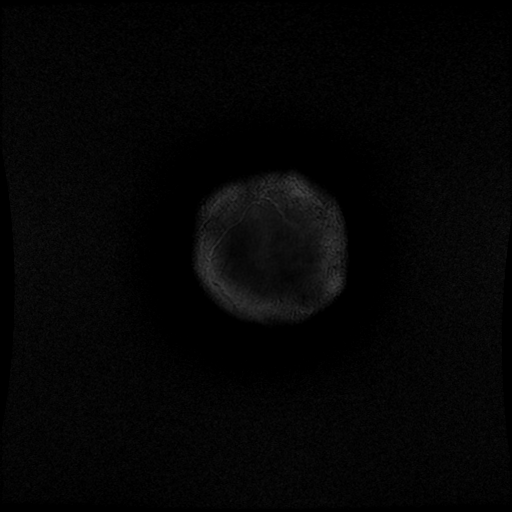

[Series 400: DWI · axial · 5.0mm · 1.09mm/px · z∈[-80,+51]mm · 5 of 26 slices shown (2 of 2)]
[im 1/26]
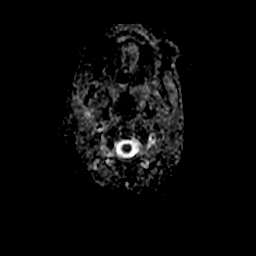
[im 7/26]
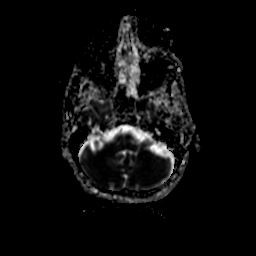
[im 13/26]
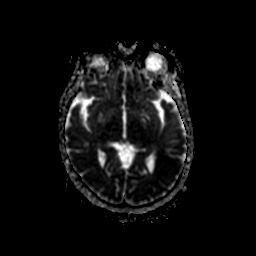
[im 19/26]
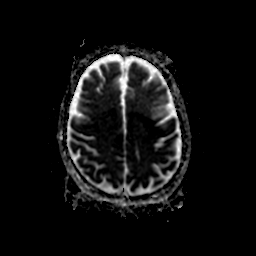
[im 26/26]
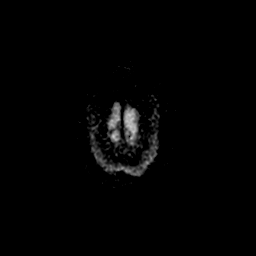

[31 of 48 positions shown; findings below may reference images not displayed]

FINDINGS: No acute infarct, hemorrhage, mass lesion, hydrocephalus
is present.

Moderate generalized atrophy is present.  White matter change is
slightly advanced for age.  The flow is present in the major
intracranial arteries.  The globes and orbits are intact.  The
paranasal sinuses and mastoid air cells are clear.
IMPRESSION: 1.  No acute intracranial abnormality.
2.  Age advanced advanced atrophy.
3.  Minimal white matter change.

## 2012-07-05 IMAGING — CR DG CHEST 2V
1 series · 1 of 1 positions shown · non-contrast
Comparison: 11/11/2009

CLINICAL DATA: Abdominal pain, weakness.

CHEST - 2 VIEW

[view not recorded]
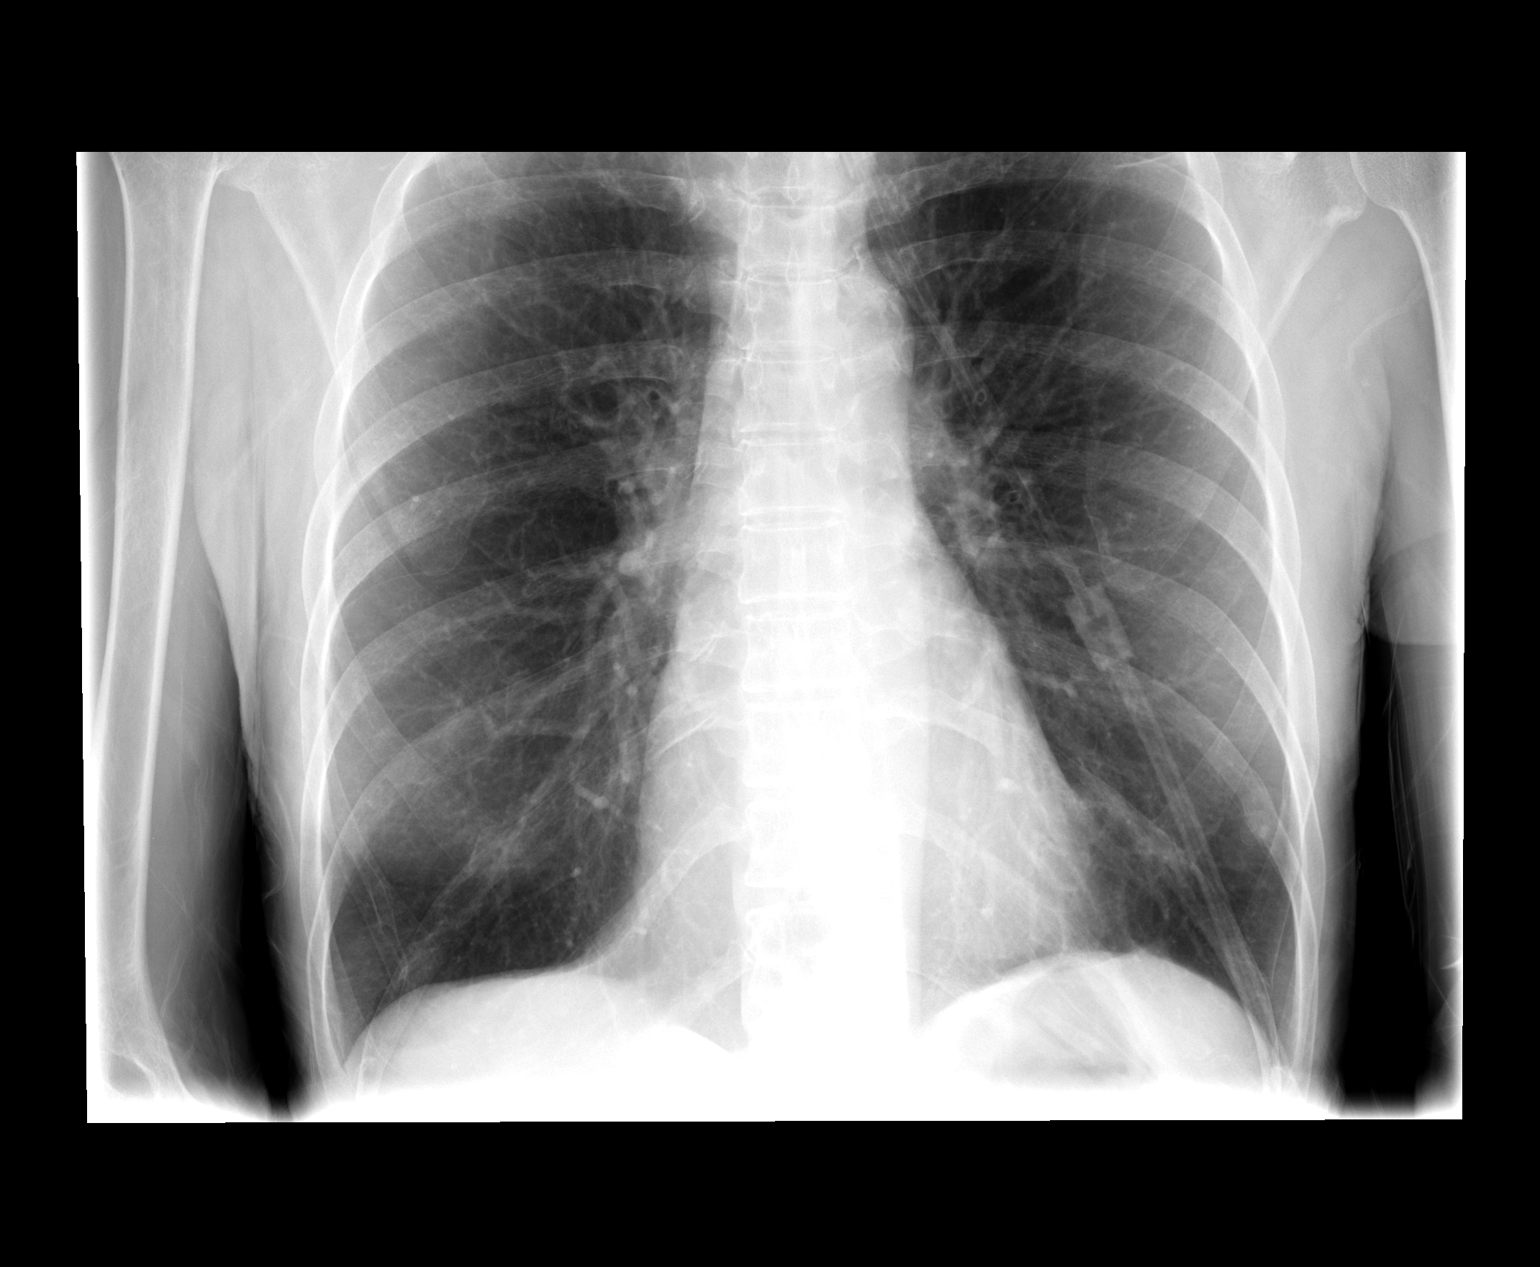

[1 of 1 positions shown; findings below may reference images not displayed]

FINDINGS: There is hyperinflation of the lungs compatible with
COPD.  Heart and mediastinal contours are within normal limits.  No
focal opacities or effusions.  No acute bony abnormality.
IMPRESSION: COPD.  No active disease.

## 2012-09-22 IMAGING — CT CT ABDOMEN WO/W CM
3 of 5 series · 13 of 32 positions shown, 18 images · IV contrast (agent unspecified)
Comparison: 02/19/2009

CLINICAL DATA: Follow-up renal cell carcinoma.  Previous
radiofrequency ablation.  Left flank pain.

CT ABDOMEN WITHOUT AND WITH CONTRAST
TECHNIQUE: Multidetector CT imaging of the abdomen was performed
following the standard protocol before and during bolus
administration of intravenous contrast.
Contrast: 100 ml [id number]

[Series 2: renal w/o · axial · non-contrast · 0.62mm/px · z∈[-330,-190]mm · 7 of 63 slices shown, 12 images]
[im 8/63  soft-tissue]
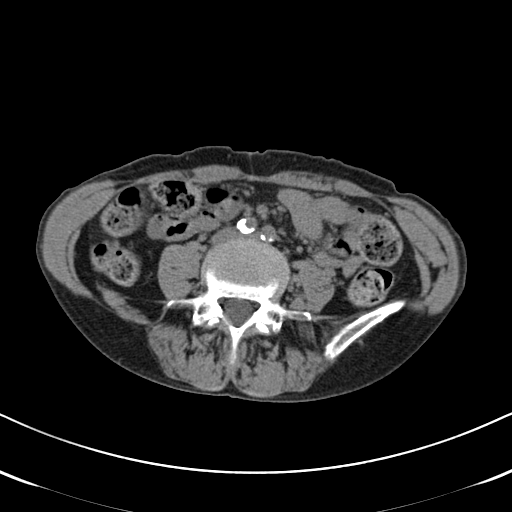
[im 8/63  bone]
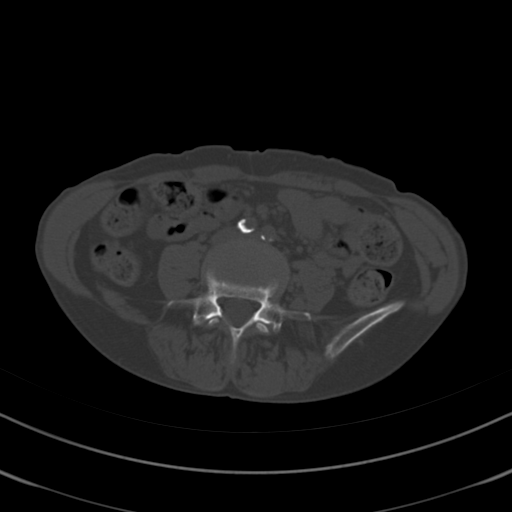
[im 16/63  soft-tissue]
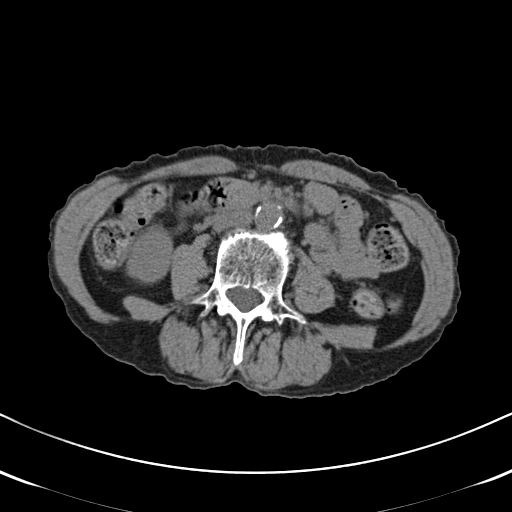
[im 24/63  soft-tissue]
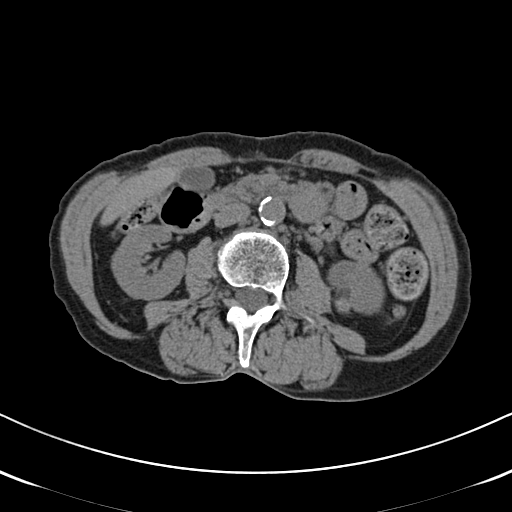
[im 32/63  soft-tissue]
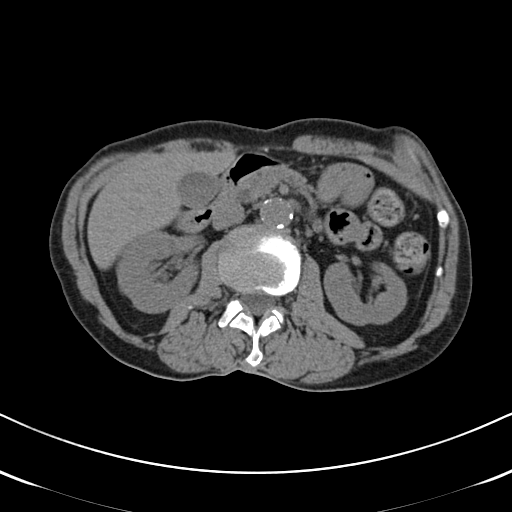
[im 32/63  lung]
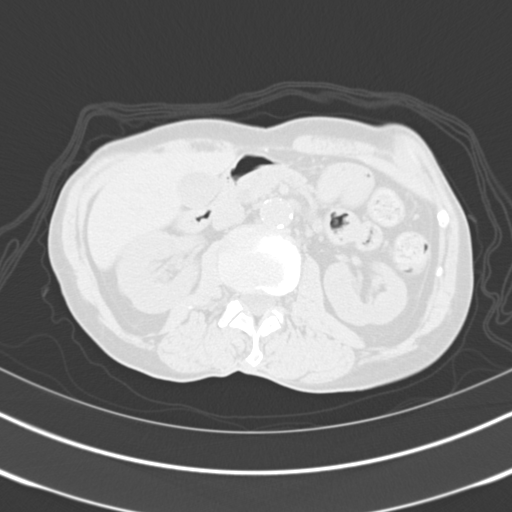
[im 39/63  soft-tissue]
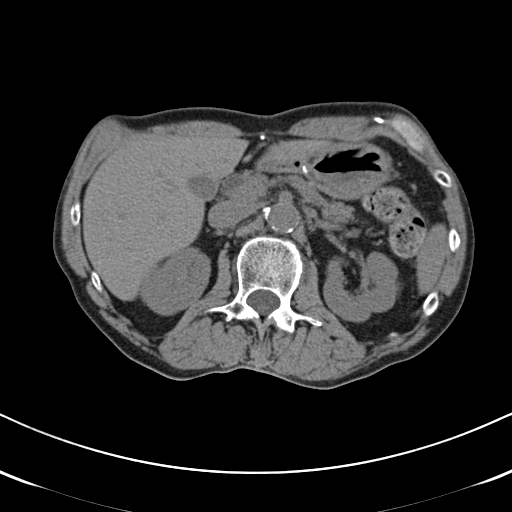
[im 39/63  lung]
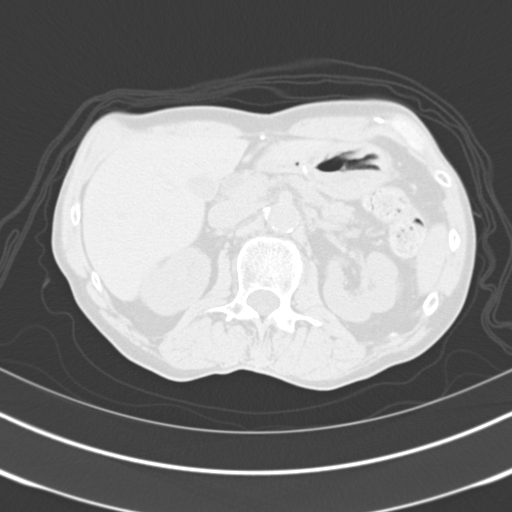
[im 47/63  soft-tissue]
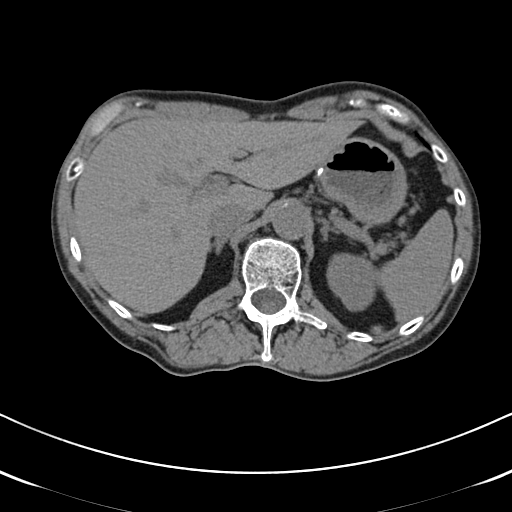
[im 47/63  lung]
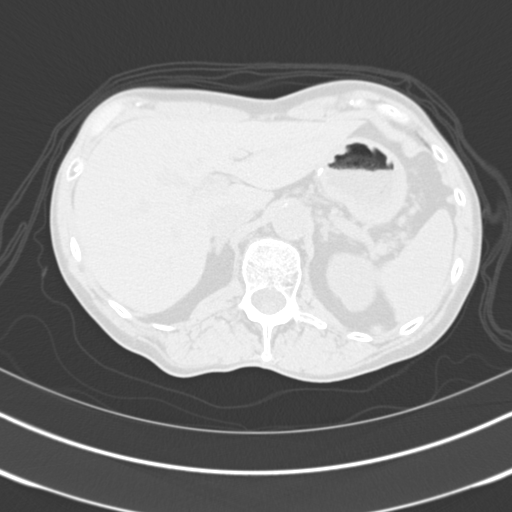
[im 55/63  soft-tissue]
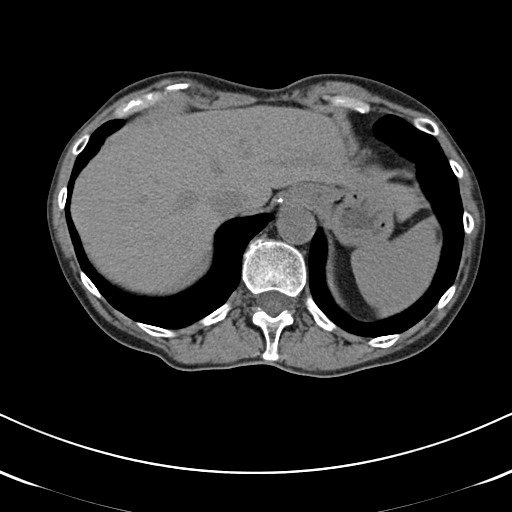
[im 55/63  lung]
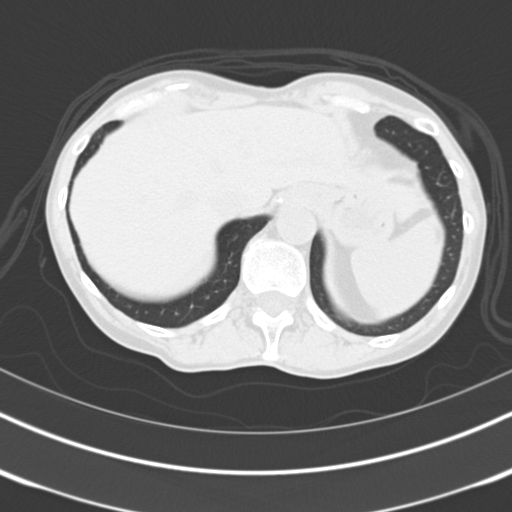

[Series 5: renal nephrographic · axial · 0.62mm/px · z∈[-314,-194]mm · 5 of 61 slices shown (1 of 2)]
[im 11/61  soft-tissue]
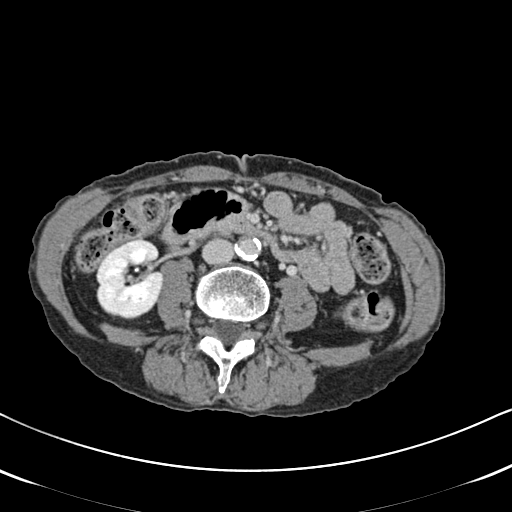
[im 21/61  soft-tissue]
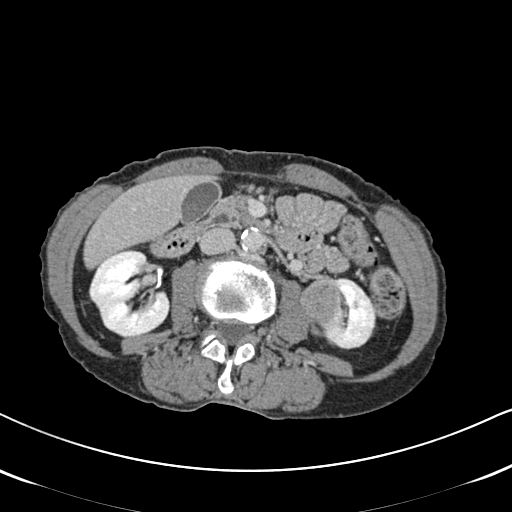
[im 31/61  soft-tissue]
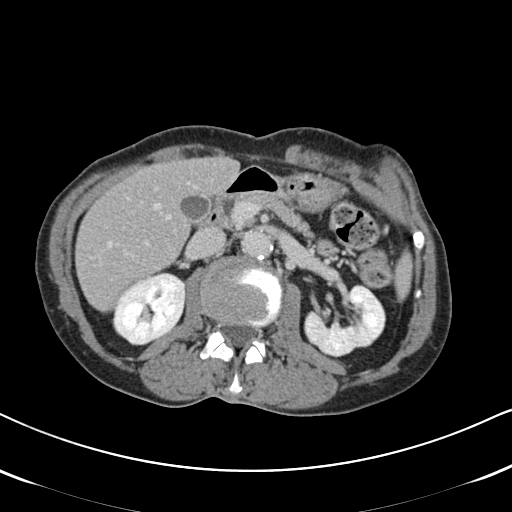
[im 41/61  soft-tissue]
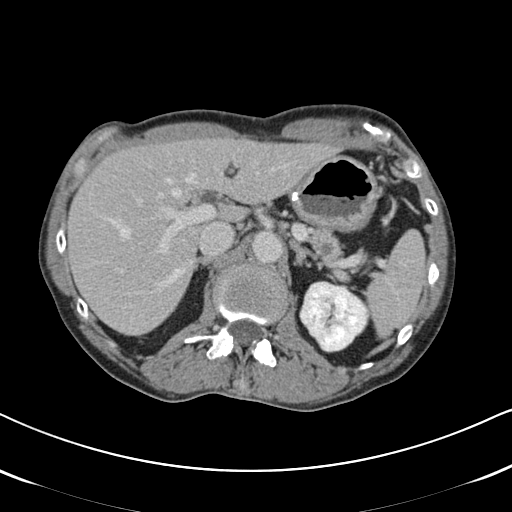
[im 51/61  soft-tissue]
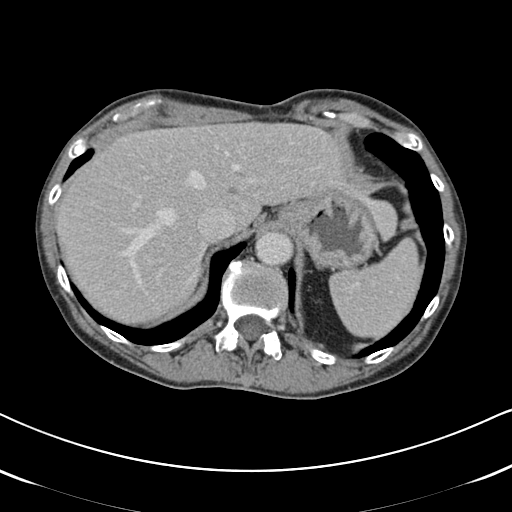

[Series 6: renal nephrographic · axial · 0.62mm/px · 1 of 61 slices shown (2 of 2)]
[im 11/61  soft-tissue]
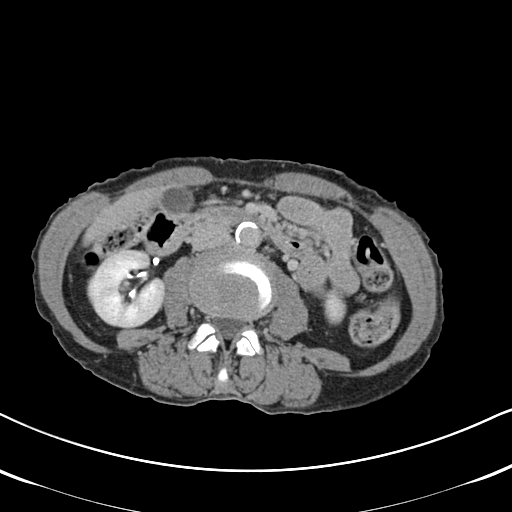

[13 of 32 positions shown; findings below may reference images not displayed]

FINDINGS: A chronic radiofrequency ablation defect is again seen
along the posterior lower pole of the left kidney.  A diffusely
enhancing soft tissue mass is now seen in the medial lower pole of
the left kidney just anterior to this defect.  This mass measures
2.5 x 3.0 cm and is consistent with recurrent renal cell carcinoma.
No other masses are seen involving either kidney.  There is no
evidence of hydronephrosis.

There is no evidence of renal vein or IVC thrombus.  There is no
evidence of retroperitoneal lymphadenopathy.

The liver, gallbladder, spleen, and adrenal glands are normal in
appearance.  A 9 mm unilocular cyst is seen in the pancreatic tail
which was not well visualized on the previous studies performed
without intravenous contrast.  This has benign features, and could
represent a side branch intraductal papillary mucinous neoplasm or
pseudocyst.  No evidence of main pancreatic ductal dilatation or
other pancreatic lesions.

There is no evidence of inflammatory process or abnormal fluid
collections.  No evidence of dilated bowel loops.  No suspicious
bone lesions are identified.  Visualized portions of the lung bases
are clear.
IMPRESSION: 1.  Development of 3.0 cm enhancing mass in the medial lower pole
of the left kidney, consistent with recurrent renal cell carcinoma.
2.  No evidence of metastatic disease.
3.  9 mm low locular cyst in the pancreatic tail, which has benign
imaging features, and likely represents a side branch intraductal
papillary mucinous neoplasm or pseudocyst.  Follow-up by CT or MRI
is recommended in 12 months to confirm stability.

## 2012-09-22 IMAGING — US IR VENIPUNCTURE 3YRS/OLDER BY MD
1 series · 1 of 1 positions shown · non-contrast
Comparison: none

CLINICAL DATA: Poor peripheral veins, access prior to contrast CT

[Series 1: sp venipuncture 3yrs/older by md · 1 of 1 slices shown]
[im 1/1]
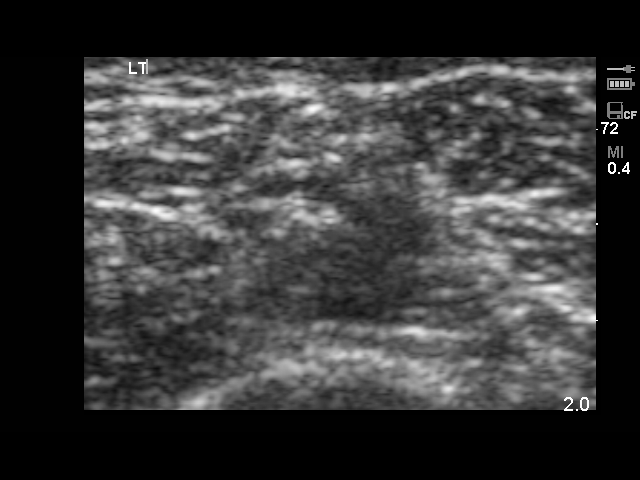

[1 of 1 positions shown; findings below may reference images not displayed]

ULTRASOUND GUIDANCE VASCULAR ACCESS
VENIPUNCTURE BY PHYSICIAN

Date:  03/10/2010 [DATE]

Radiologist:  Manke Hara, M.D.

Medications:  1% lidocaine locally

Guidance:  Ultrasound

Complications:  No immediate

PROCEDURE/FINDINGS:

Informed consent was obtained from the patient following
explanation of the procedure, risks, benefits and alternatives.
The patient understands, agrees and consents for the procedure.
All questions were addressed.  A time out was performed.

Maximal barrier sterile technique utilized including caps, mask,
sterile gowns, sterile gloves, large sterile drape, hand hygiene,
and betadine

Under sterile conditions and local anesthesia, initially attempts
were made to access a right brachial vein however the guide wire
would not advance.  Therefore this site was aborted.

Under sterile conditions and local anesthesia, the left brachial
vein was accessed with a micropuncture needle.  Images obtained for
documentation.  The guide wire advanced easily followed by the 4-
French dilator set.  The dilator remains for IV access prior to
contrast CT.  This was secured externally and flushed with saline.
No immediate complication.  The patient tolerated the procedure
well.
IMPRESSION: Ultrasound left upper extremity peripheral IV insertion prior to
contrast CT of the abdomen.

## 2012-11-27 IMAGING — US IR US GUIDE VASC ACCESS LEFT
1 series · 1 of 1 positions shown · non-contrast
Comparison: none

CLINICAL DATA: IV access needed for anesthesia during cryoablation
of the left renal tumor.  There is known inability to obtain
peripheral venous access and the patient has had multiple prior
PICC lines placed.

[Series 1: ir fluoro guide cv line*right* · 1 of 1 slices shown]
[im 1/1]
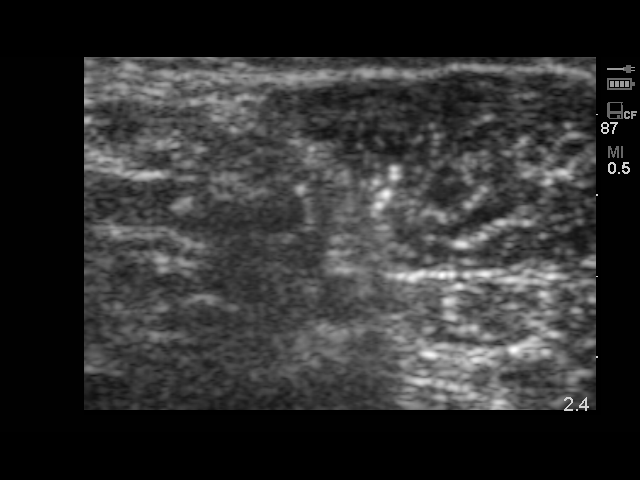

[1 of 1 positions shown; findings below may reference images not displayed]

PICC LINE PLACEMENT WITH ULTRASOUND AND FLUOROSCOPIC  GUIDANCE

Fluoroscopy Time: 0.1 minutes.

The left arm was prepped with chlorhexidine, draped in the usual
sterile fashion using maximum barrier technique (cap and mask,
sterile gown, sterile gloves, large sterile sheet, hand hygiene and
cutaneous antisepsis) and infiltrated locally with 1% Lidocaine.

Ultrasound demonstrated patency of the left brachial vein, and this
was documented with an image.  Under real-time ultrasound guidance,
this vein was accessed with a 21 gauge micropuncture needle and
image documentation was performed.  The needle was exchanged over a
guidewire for a peel-away sheath through which a 5 French dual
lumen PICC trimmed to 40 cm was advanced, positioned with its tip
at the lower SVC/right atrial junction.  Fluoroscopy during the
procedure and fluoro spot radiograph confirms appropriate catheter
position.  The catheter was flushed, secured to the skin with
Prolene sutures, and covered with a sterile dressing.

Complications:  None
IMPRESSION: Successful left arm PICC line placement with ultrasound and
fluoroscopic guidance.  The catheter is ready for use.

## 2012-12-26 IMAGING — CT CT HEAD W/O CM
1 series · 16 of 30 positions shown, 20 images · non-contrast
Comparison: Brain MRI head CT scan 12/21/2009.

CLINICAL DATA: Headache.

CT HEAD WITHOUT CONTRAST
TECHNIQUE: Contiguous axial images were obtained from the base of
the skull through the vertex without contrast.

[Series 2: head_seq 4.5 h37s st · axial · 0.43mm/px · z∈[-142,+2]mm · 16 of 36 slices shown, 20 images]
[im 2/36  brain]
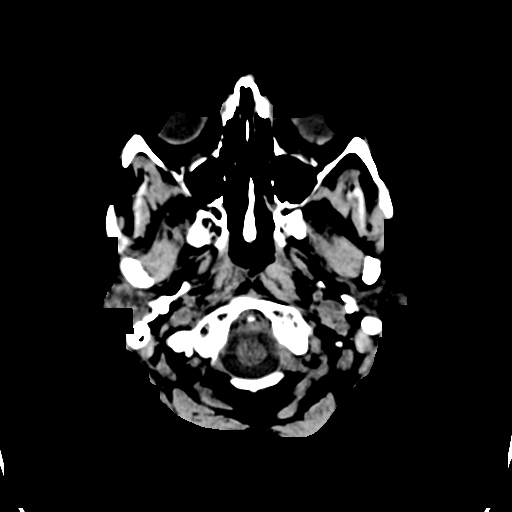
[im 2/36  bone]
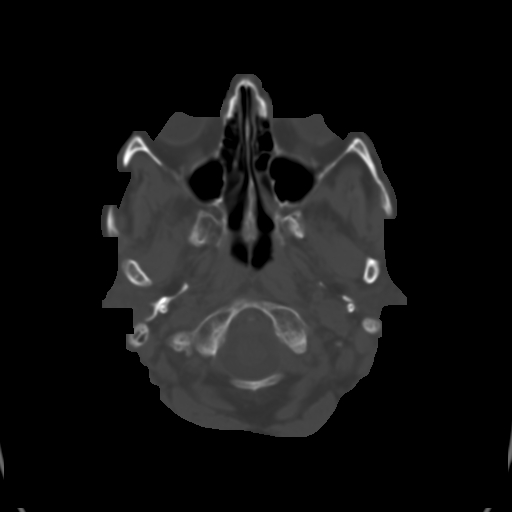
[im 4/36  brain]
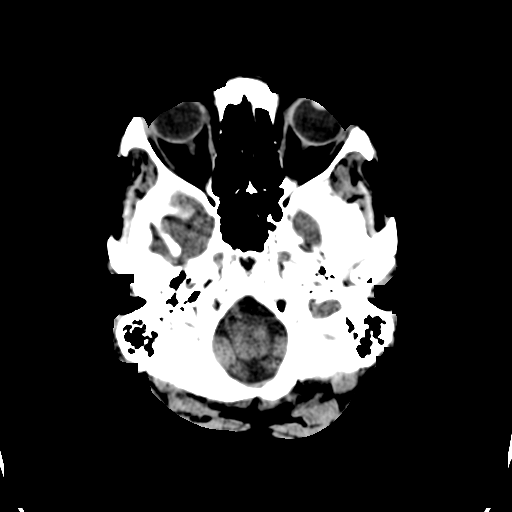
[im 7/36  brain]
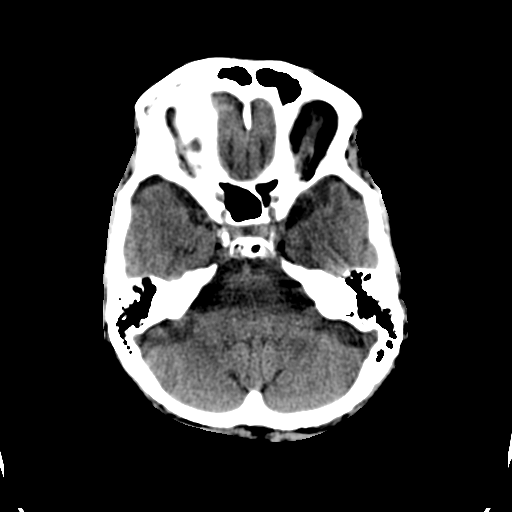
[im 9/36  brain]
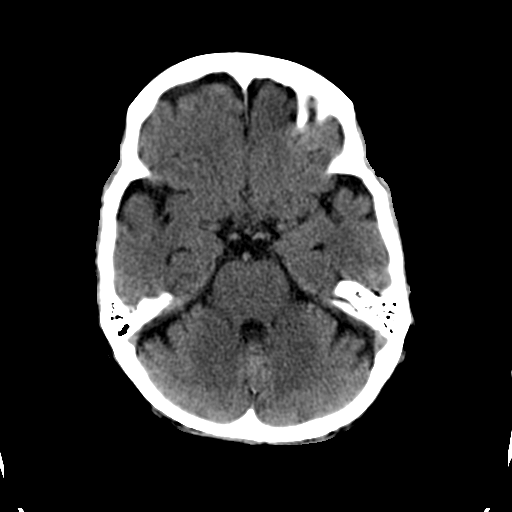
[im 10/36  brain]
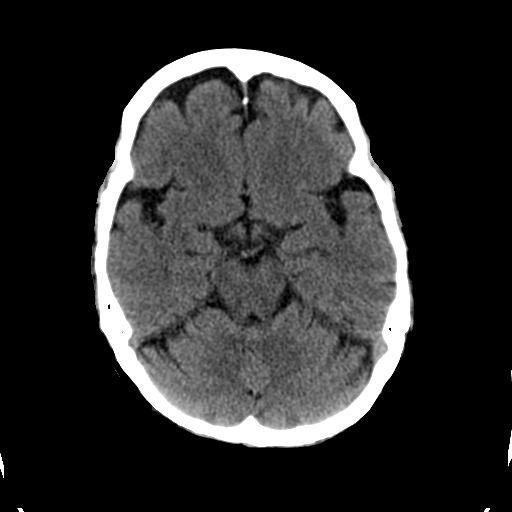
[im 10/36  bone]
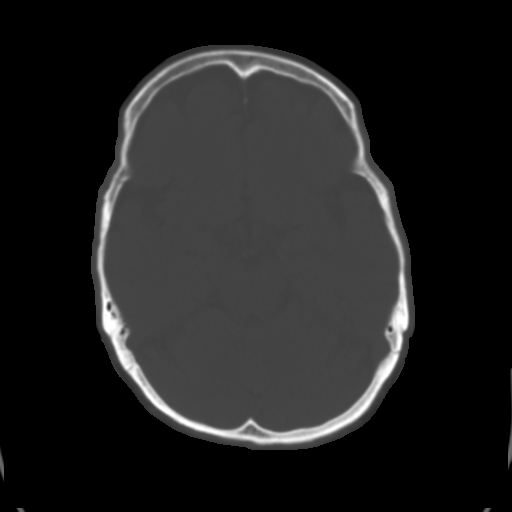
[im 13/36  brain]
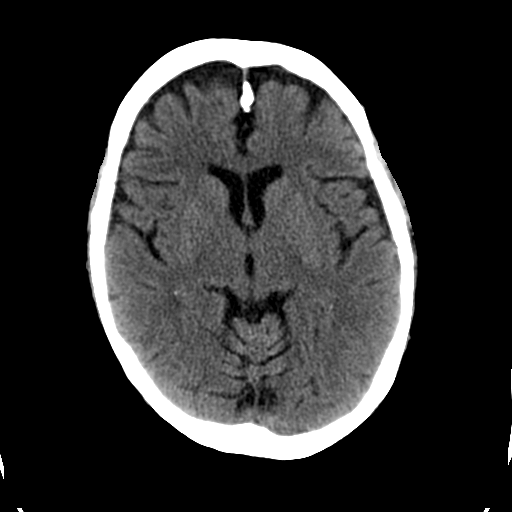
[im 15/36  brain]
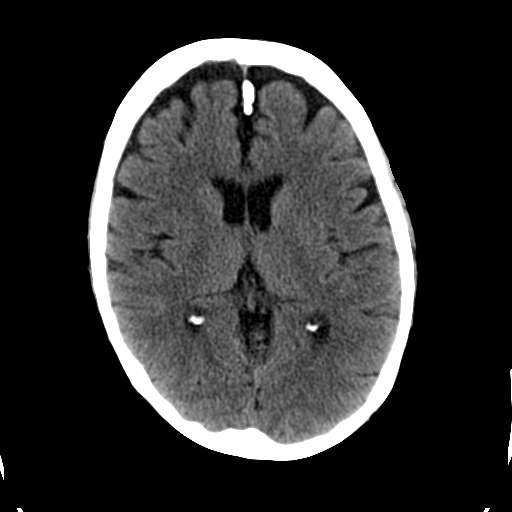
[im 17/36  brain]
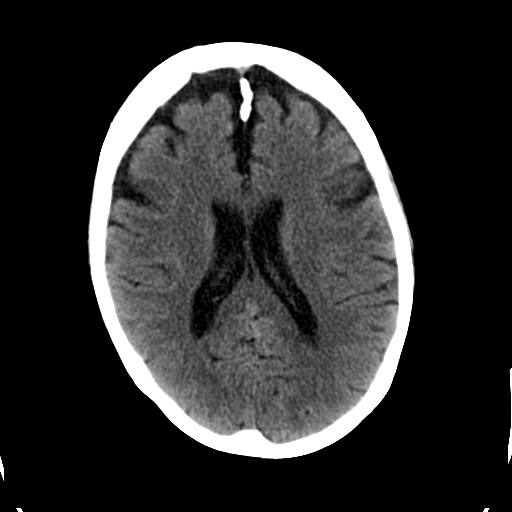
[im 19/36  brain]
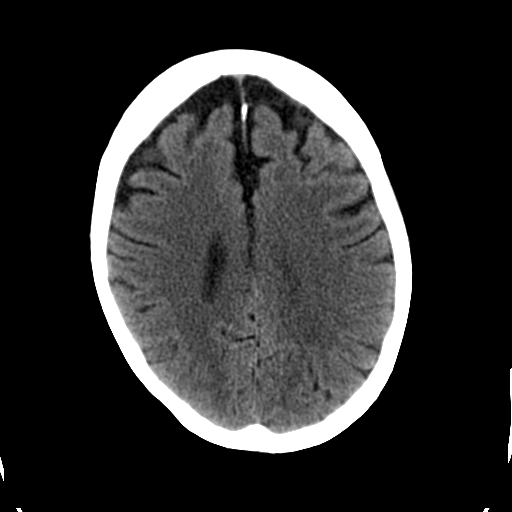
[im 19/36  bone]
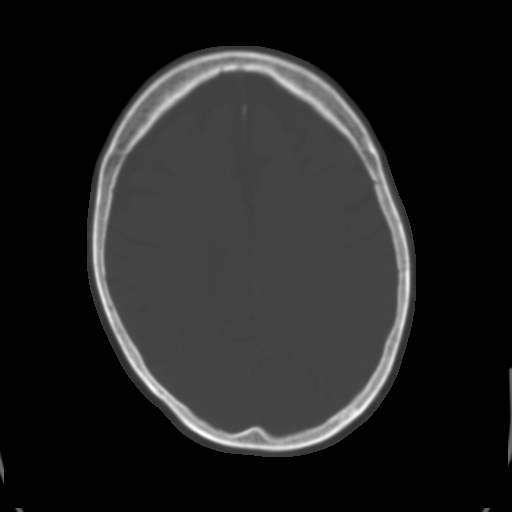
[im 21/36  brain]
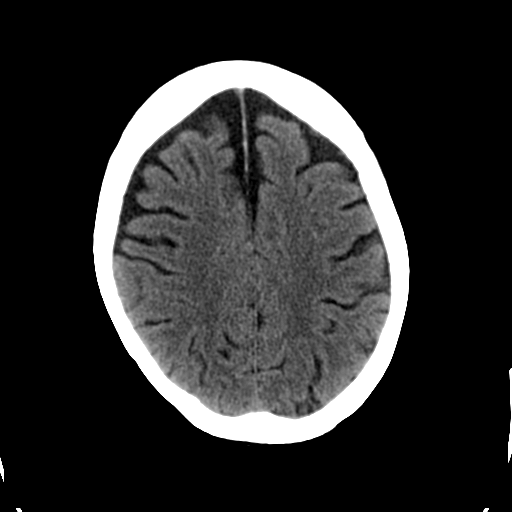
[im 23/36  brain]
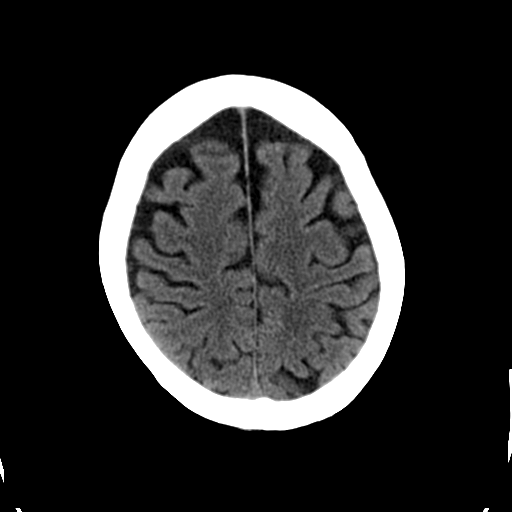
[im 26/36  brain]
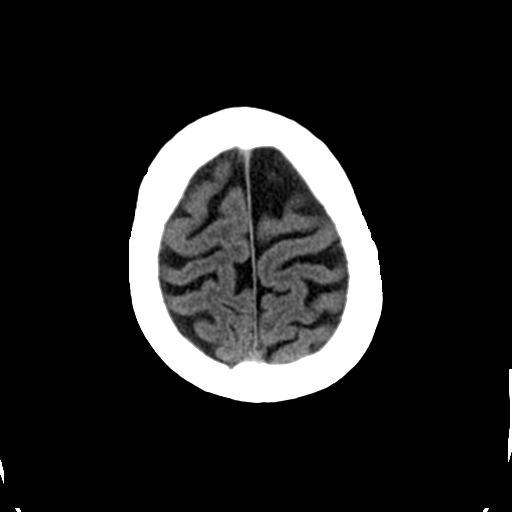
[im 27/36  brain]
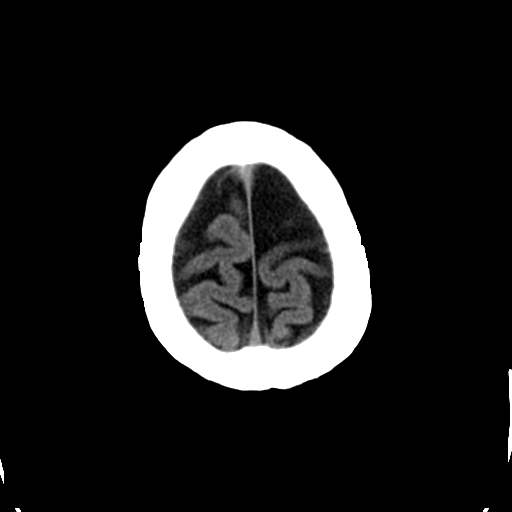
[im 27/36  bone]
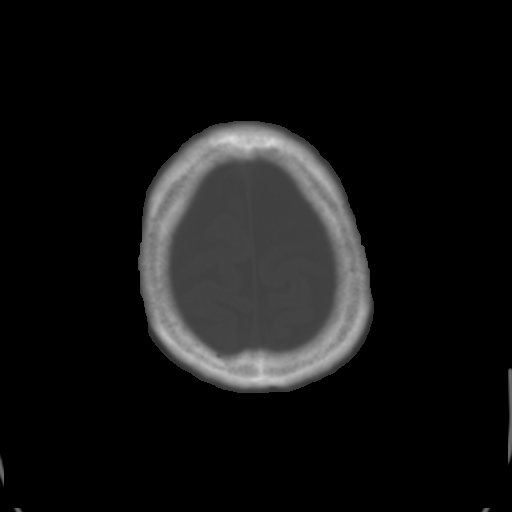
[im 29/36  brain]
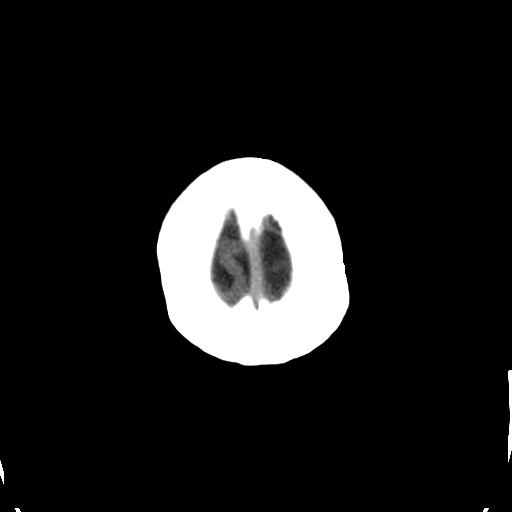
[im 32/36  brain]
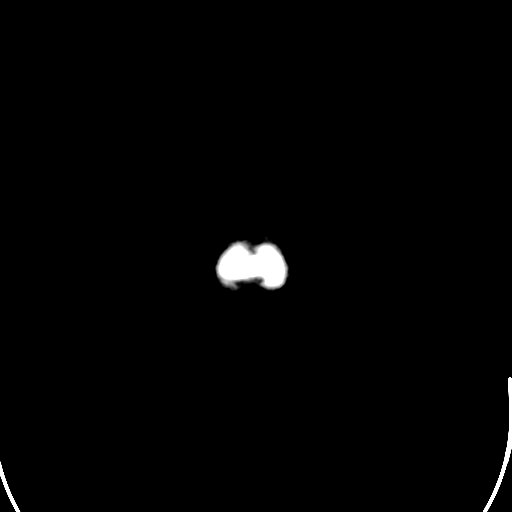
[im 34/36  brain]
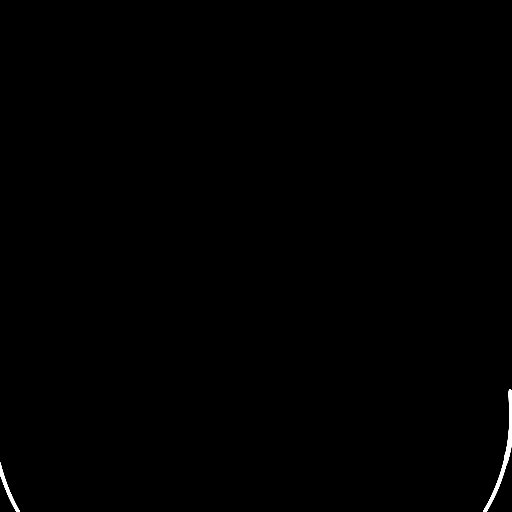

[16 of 30 positions shown; findings below may reference images not displayed]

FINDINGS: Advanced for age cortical atrophy is again seen.  There
is no evidence of acute intracranial abnormality including acute
infarction, hemorrhage, mass lesion, mass effect, midline shift or
abnormal extra-axial fluid collection.  No pneumocephalus or
hydrocephalus.  Calvarium intact.
IMPRESSION: Atrophy.  No acute finding.  Stable compared to prior exam.

## 2012-12-26 IMAGING — CR DG CHEST 2V
2 series · 2 of 2 positions shown · non-contrast
Comparison: PA and lateral chest 12/21/2009.

CLINICAL DATA: Cough.  COPD.  Smoker.

CHEST - 2 VIEW

[w chest pa]
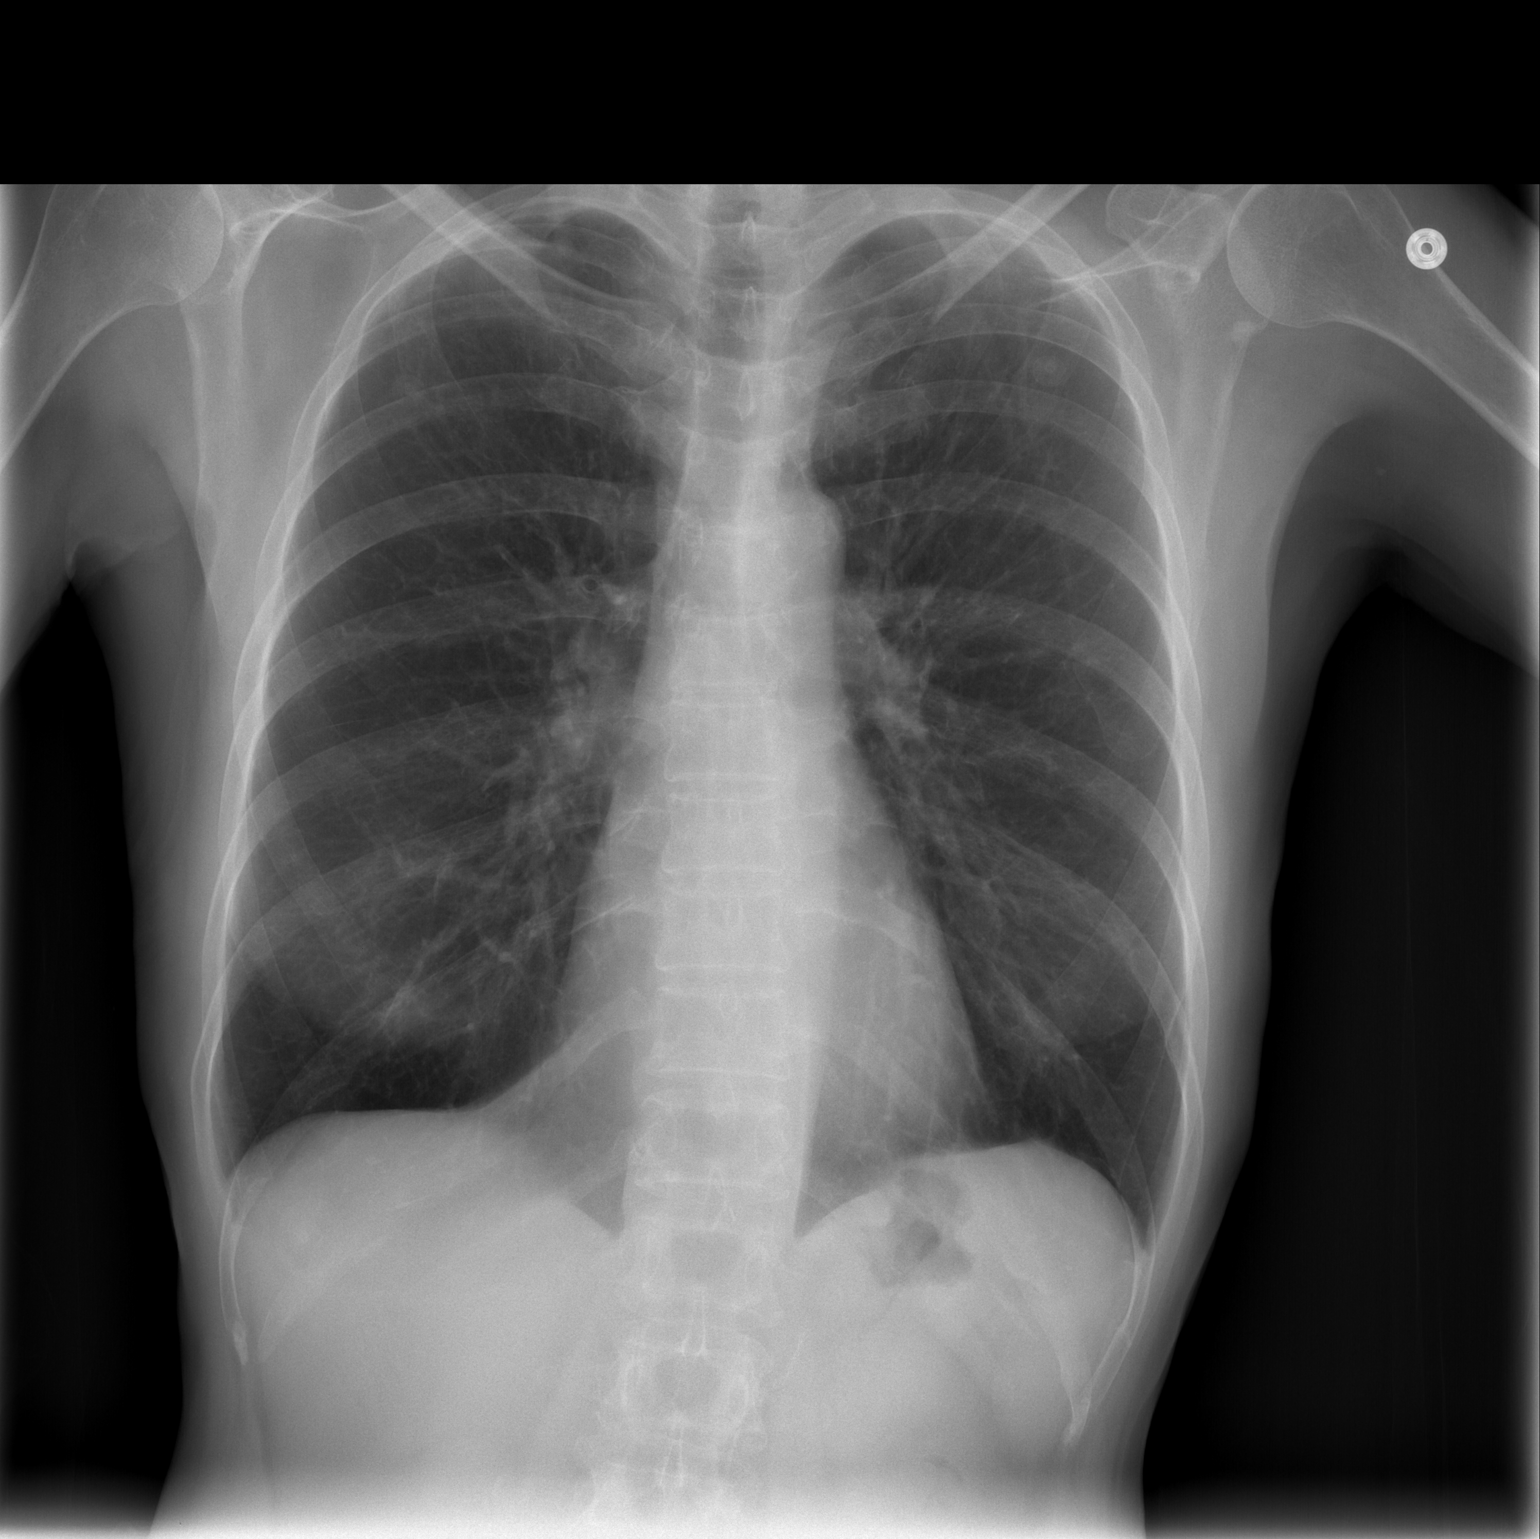

[w chest lat]
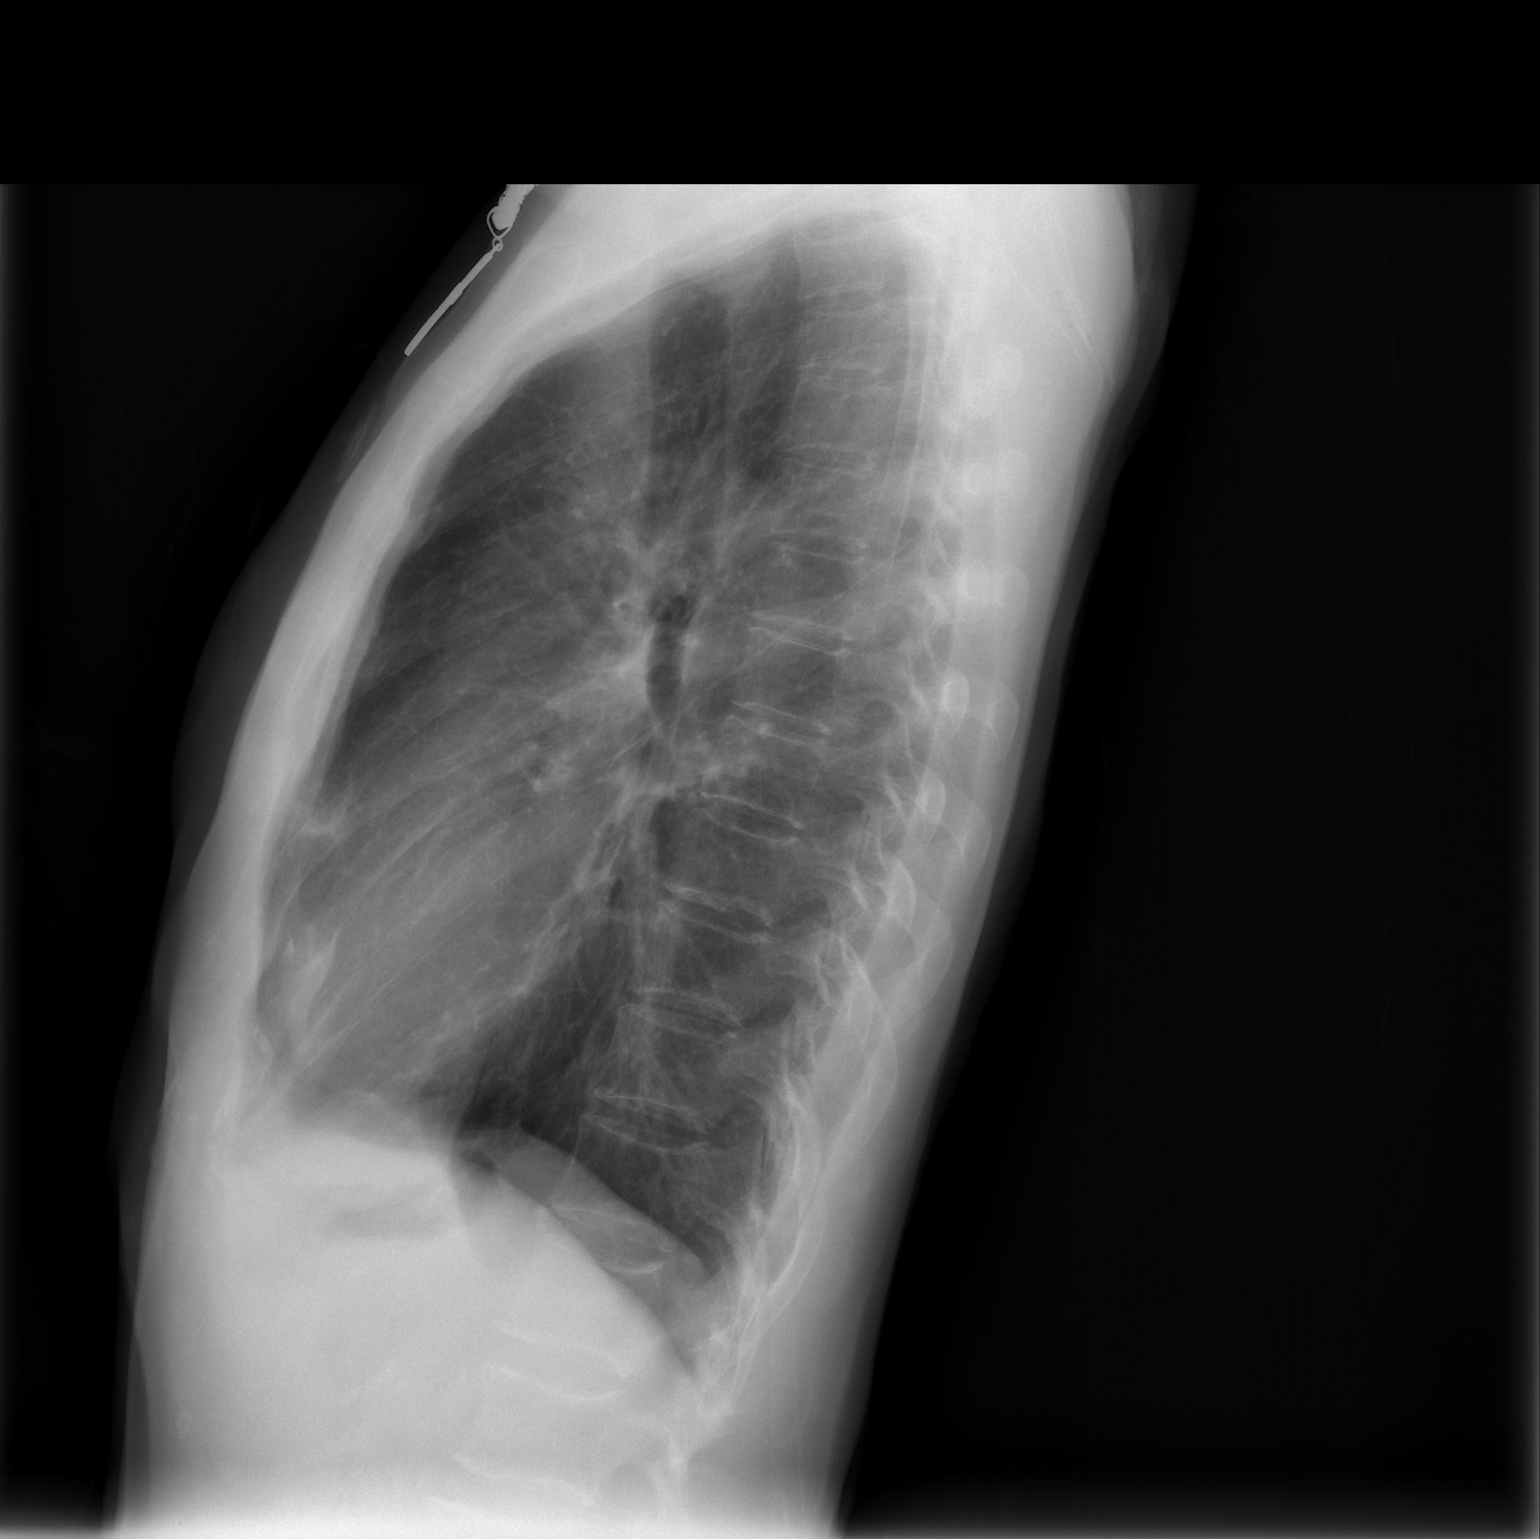

[2 of 2 positions shown; findings below may reference images not displayed]

FINDINGS: The chest is hyperexpanded.  Lungs are clear.  Heart size
is normal.  No pneumothorax or effusion.
IMPRESSION: Emphysema without acute disease.

## 2012-12-28 IMAGING — CT CT ABD-PELV W/O CM
1 of 2 series · 15 of 32 positions shown, 19 images · non-contrast
Comparison: 05/15/2010.

CLINICAL DATA: Abdominal pain.  Nausea.

CT ABDOMEN AND PELVIS WITHOUT CONTRAST
TECHNIQUE: Multidetector CT imaging of the abdomen and pelvis was
performed following the standard protocol without intravenous
contrast.

[Series 2: rtn ap without · axial · non-contrast · 0.68mm/px · z∈[-522,-132]mm · 15 of 86 slices shown, 19 images]
[im 4/86  soft-tissue]
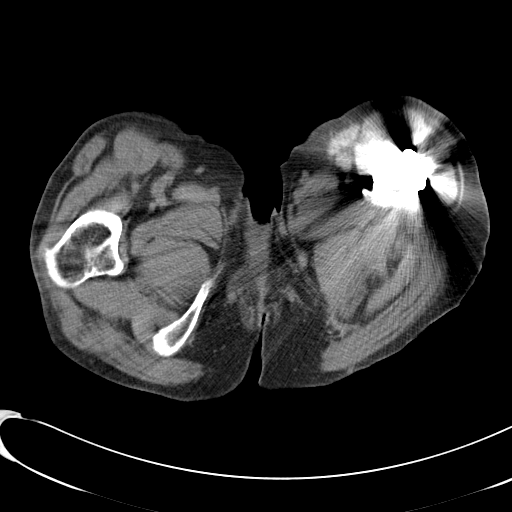
[im 4/86  bone]
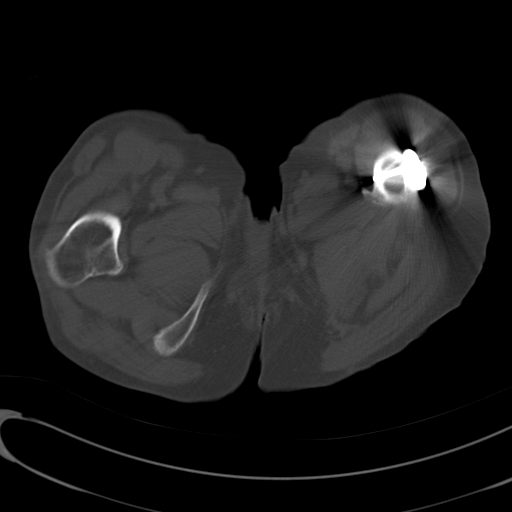
[im 11/86  soft-tissue]
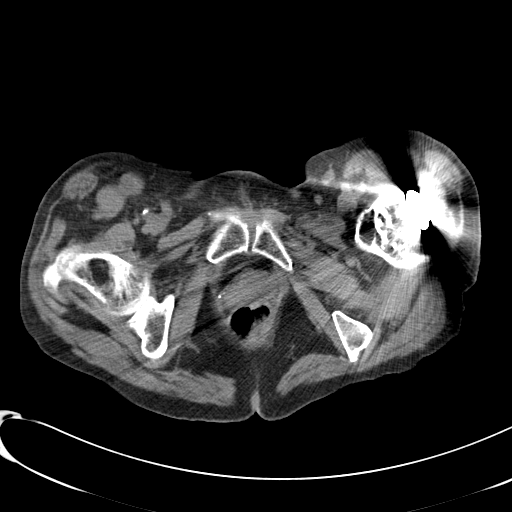
[im 18/86  soft-tissue]
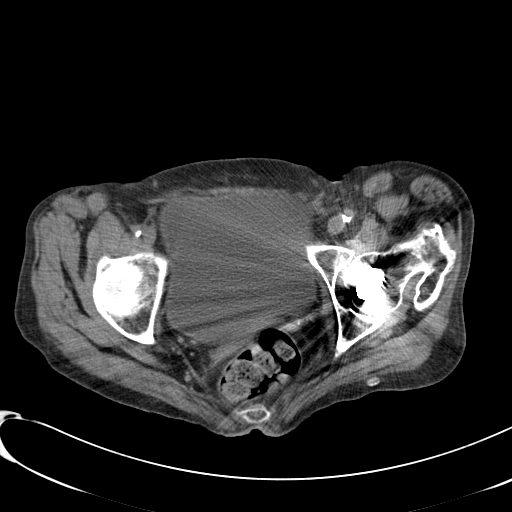
[im 25/86  soft-tissue]
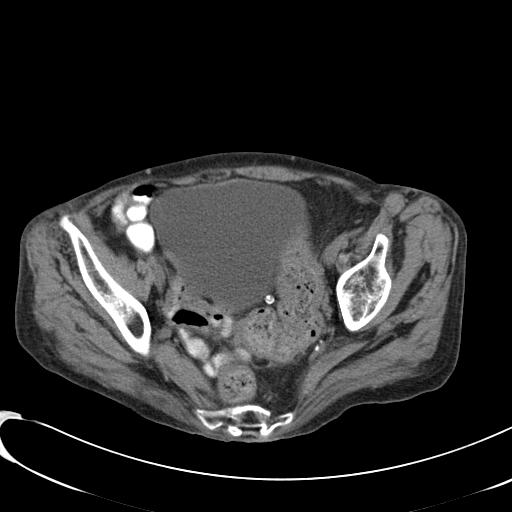
[im 29/86  soft-tissue]
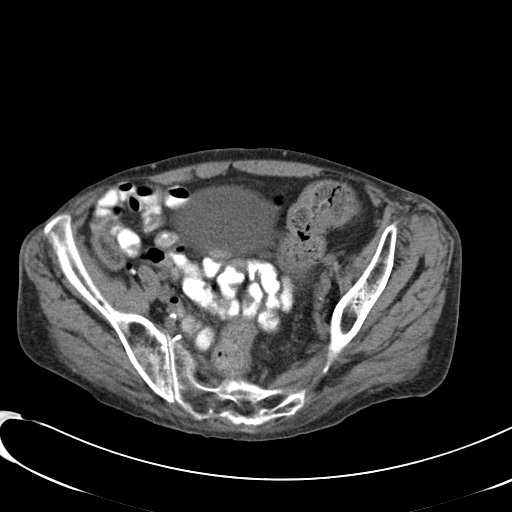
[im 36/86  soft-tissue]
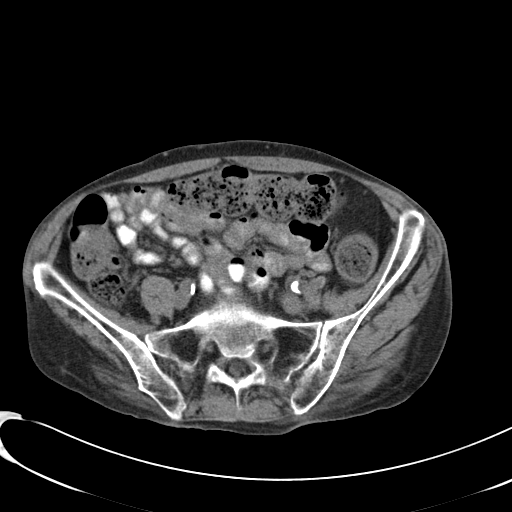
[im 43/86  soft-tissue]
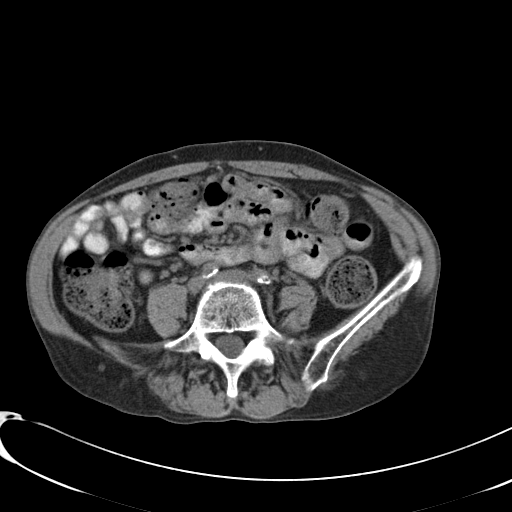
[im 50/86  soft-tissue]
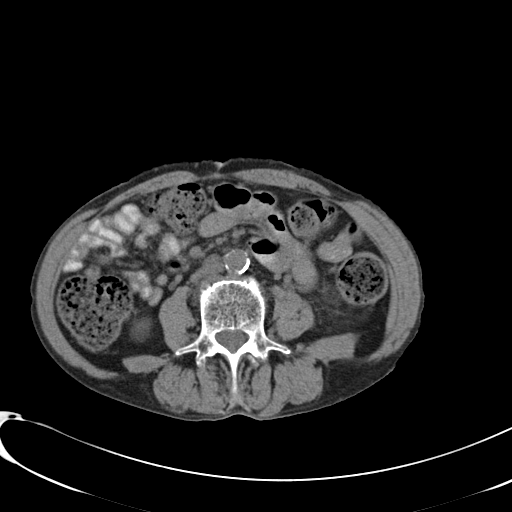
[im 57/86  soft-tissue]
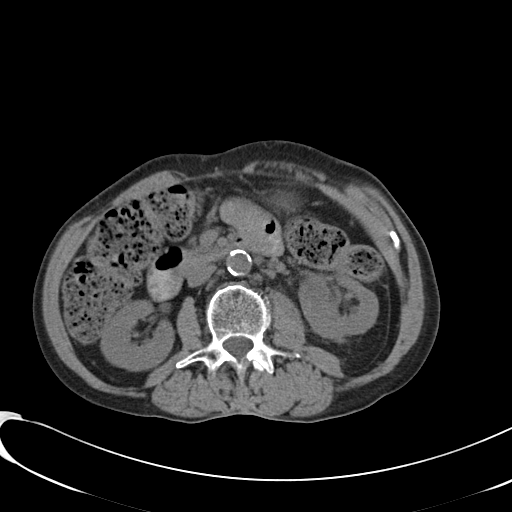
[im 57/86  bone]
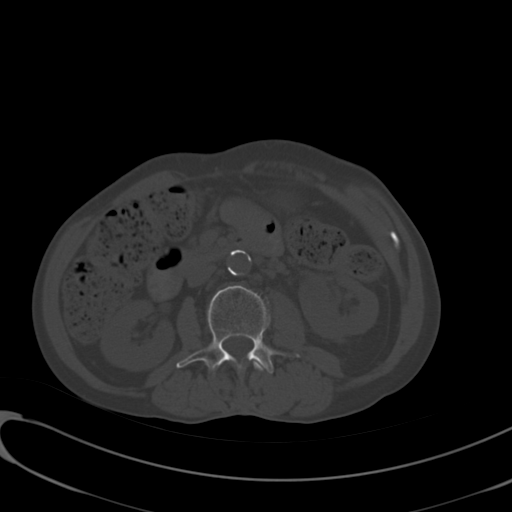
[im 61/86  soft-tissue]
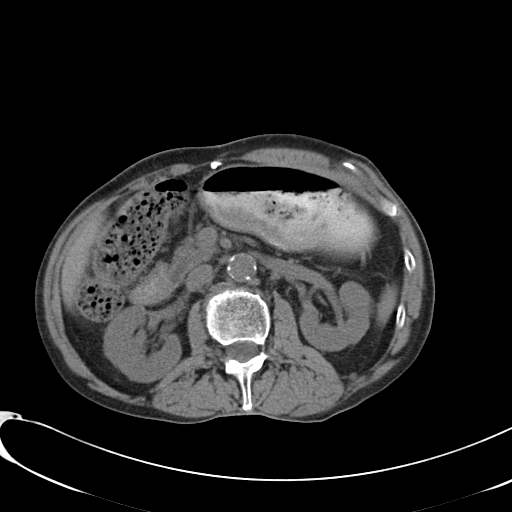
[im 68/86  soft-tissue]
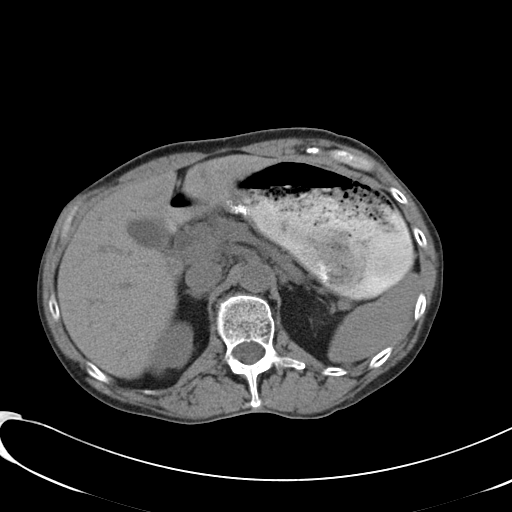
[im 71/86  lung]
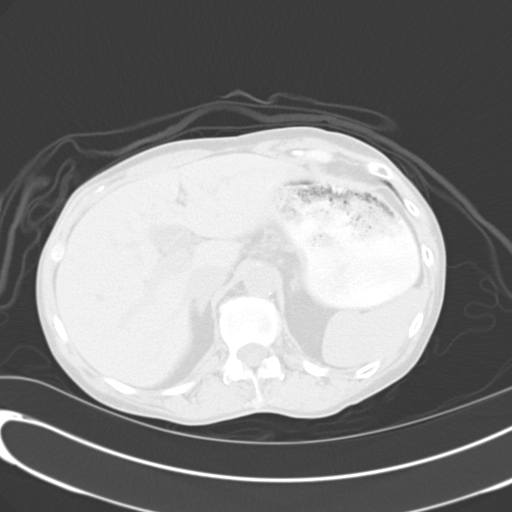
[im 75/86  soft-tissue]
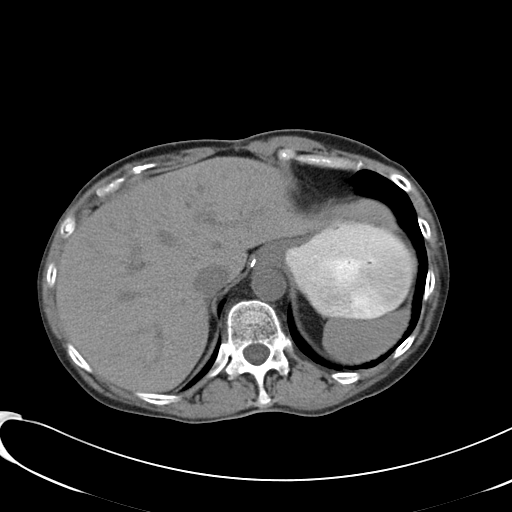
[im 75/86  lung]
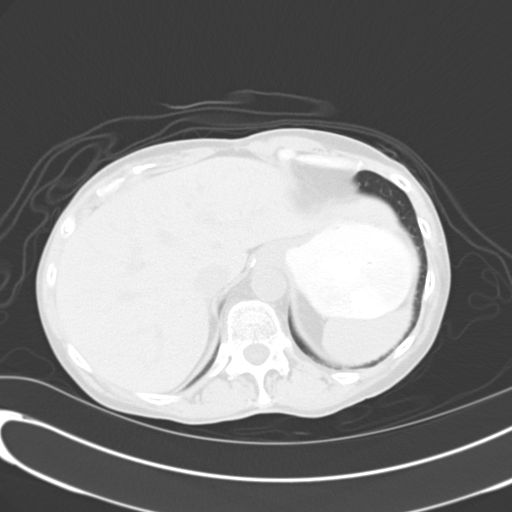
[im 78/86  lung]
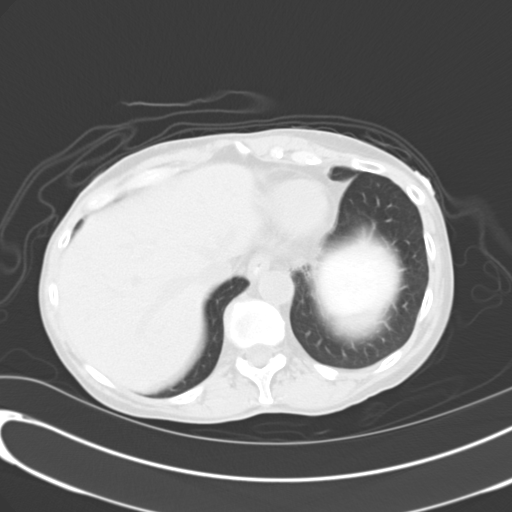
[im 82/86  soft-tissue]
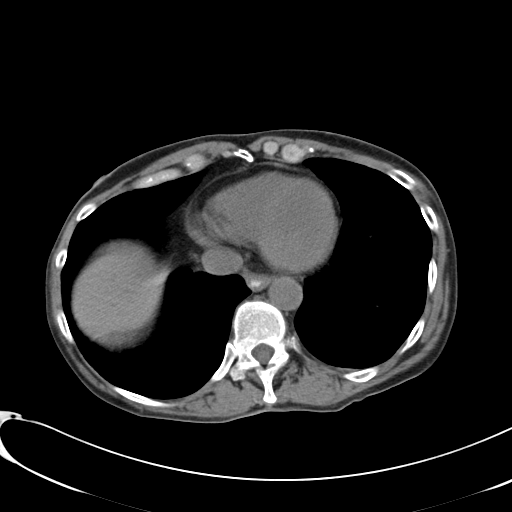
[im 82/86  lung]
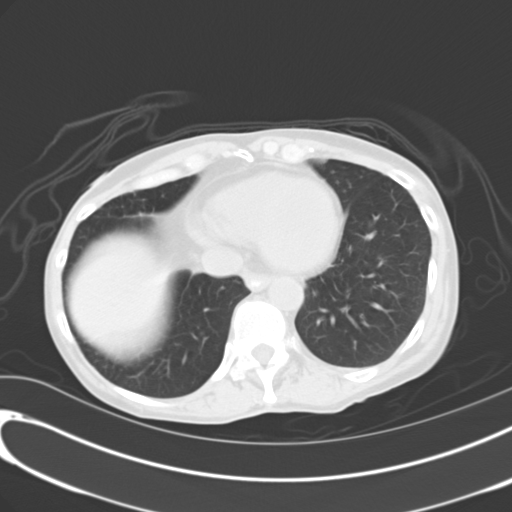

[15 of 32 positions shown; findings below may reference images not displayed]

FINDINGS: Lung bases show emphysema.  Surgical clip present near
the gastroesophageal junction.  Noncontrast appearance of the liver
is unchanged.  Mildly hyperattenuating left renal inferior pole
lesion is present compatible with recurrent renal cell carcinoma
with recent ablation.  Mild perinephric stranding is present.  No
change in size compared to recent prior exam.  The right kidney is
within normal limits.  Aorto iliofemoral atherosclerosis without
aneurysm.  Urinary bladder appears within normal limits.  Large
amount of stool is present within the colon.  Small bowel is within
normal limits.  No abdominal or retroperitoneal adenopathy.  No
hydronephrosis.  Osteopenia.  Lumbar spondylosis.

The cyst in the tail of the pancreas is poorly visualized secondary
to noncontrast technique.  This is likely still present allowing
for technique. Left dynamic hip screw with associated scatter and
beam hardening artifact.
IMPRESSION: 1.  Postprocedural changes of prior ablation of the left inferior
pole renal cell carcinoma.
2.  No acute abnormality.
3.  Large stool burden.
4.  Cystic lesion in the tail of the pancreas poorly visualized
secondary to noncontrast technique.
5.  Surgical staples in the wall of the stomach and clips in the
upper abdomen consistent with prior gastric surgery.

## 2013-02-09 ENCOUNTER — Encounter (HOSPITAL_COMMUNITY): Payer: Self-pay | Admitting: Emergency Medicine

## 2013-02-09 ENCOUNTER — Emergency Department (HOSPITAL_COMMUNITY): Payer: Medicaid Other

## 2013-02-09 ENCOUNTER — Inpatient Hospital Stay (HOSPITAL_COMMUNITY)
Admission: AD | Admit: 2013-02-09 | Discharge: 2013-02-11 | DRG: 438 | Disposition: A | Discharge status: Home / Self Care | Payer: Medicaid Other | Attending: Internal Medicine | Admitting: Internal Medicine

## 2013-02-09 DIAGNOSIS — F1320 Sedative, hypnotic or anxiolytic dependence, uncomplicated: Secondary | ICD-10-CM | POA: Diagnosis present

## 2013-02-09 DIAGNOSIS — R109 Unspecified abdominal pain: Secondary | ICD-10-CM

## 2013-02-09 DIAGNOSIS — F13239 Sedative, hypnotic or anxiolytic dependence with withdrawal, unspecified: Secondary | ICD-10-CM | POA: Diagnosis present

## 2013-02-09 DIAGNOSIS — J4489 Other specified chronic obstructive pulmonary disease: Secondary | ICD-10-CM | POA: Diagnosis present

## 2013-02-09 DIAGNOSIS — F10239 Alcohol dependence with withdrawal, unspecified: Secondary | ICD-10-CM | POA: Diagnosis present

## 2013-02-09 DIAGNOSIS — Z85528 Personal history of other malignant neoplasm of kidney: Secondary | ICD-10-CM

## 2013-02-09 DIAGNOSIS — F1020 Alcohol dependence, uncomplicated: Secondary | ICD-10-CM | POA: Diagnosis present

## 2013-02-09 DIAGNOSIS — F17200 Nicotine dependence, unspecified, uncomplicated: Secondary | ICD-10-CM | POA: Diagnosis present

## 2013-02-09 DIAGNOSIS — F411 Generalized anxiety disorder: Secondary | ICD-10-CM

## 2013-02-09 DIAGNOSIS — Z903 Acquired absence of stomach [part of]: Secondary | ICD-10-CM

## 2013-02-09 DIAGNOSIS — J441 Chronic obstructive pulmonary disease with (acute) exacerbation: Secondary | ICD-10-CM

## 2013-02-09 DIAGNOSIS — I10 Essential (primary) hypertension: Secondary | ICD-10-CM

## 2013-02-09 DIAGNOSIS — E43 Unspecified severe protein-calorie malnutrition: Secondary | ICD-10-CM | POA: Diagnosis present

## 2013-02-09 DIAGNOSIS — F1520 Other stimulant dependence, uncomplicated: Secondary | ICD-10-CM

## 2013-02-09 DIAGNOSIS — Z23 Encounter for immunization: Secondary | ICD-10-CM

## 2013-02-09 DIAGNOSIS — E876 Hypokalemia: Secondary | ICD-10-CM

## 2013-02-09 DIAGNOSIS — F19939 Other psychoactive substance use, unspecified with withdrawal, unspecified: Secondary | ICD-10-CM

## 2013-02-09 DIAGNOSIS — Z79899 Other long term (current) drug therapy: Secondary | ICD-10-CM

## 2013-02-09 DIAGNOSIS — K859 Acute pancreatitis without necrosis or infection, unspecified: Principal | ICD-10-CM

## 2013-02-09 DIAGNOSIS — K852 Alcohol induced acute pancreatitis without necrosis or infection: Secondary | ICD-10-CM | POA: Diagnosis present

## 2013-02-09 DIAGNOSIS — Z681 Body mass index (BMI) 19 or less, adult: Secondary | ICD-10-CM

## 2013-02-09 DIAGNOSIS — F10939 Alcohol use, unspecified with withdrawal, unspecified: Secondary | ICD-10-CM | POA: Diagnosis present

## 2013-02-09 DIAGNOSIS — F13939 Sedative, hypnotic or anxiolytic use, unspecified with withdrawal, unspecified: Secondary | ICD-10-CM | POA: Diagnosis present

## 2013-02-09 DIAGNOSIS — K861 Other chronic pancreatitis: Secondary | ICD-10-CM | POA: Diagnosis present

## 2013-02-09 DIAGNOSIS — J449 Chronic obstructive pulmonary disease, unspecified: Secondary | ICD-10-CM | POA: Diagnosis present

## 2013-02-09 LAB — COMPREHENSIVE METABOLIC PANEL
ALBUMIN: 3.4 g/dL — AB (ref 3.5–5.2)
ALT: 39 U/L — ABNORMAL HIGH (ref 0–35)
AST: 119 U/L — ABNORMAL HIGH (ref 0–37)
Alkaline Phosphatase: 161 U/L — ABNORMAL HIGH (ref 39–117)
BILIRUBIN TOTAL: 0.8 mg/dL (ref 0.3–1.2)
BUN: 7 mg/dL (ref 6–23)
CALCIUM: 8.9 mg/dL (ref 8.4–10.5)
CHLORIDE: 96 meq/L (ref 96–112)
CO2: 22 mEq/L (ref 19–32)
CREATININE: 0.63 mg/dL (ref 0.50–1.10)
GFR calc Af Amer: >90 mL/min (ref >90)
Glucose, Bld: 116 mg/dL — ABNORMAL HIGH (ref 70–99)
Potassium: 3.1 mEq/L — ABNORMAL LOW (ref 3.7–5.3)
Sodium: 136 mEq/L — ABNORMAL LOW (ref 137–147)
Total Protein: 7.1 g/dL (ref 6.0–8.3)

## 2013-02-09 LAB — CBC WITH DIFFERENTIAL/PLATELET
BASOS PCT: 0 % (ref 0–1)
Basophils Absolute: 0 10*3/uL (ref 0.0–0.1)
EOS PCT: 0 % (ref 0–5)
Eosinophils Absolute: 0 10*3/uL (ref 0.0–0.7)
HEMATOCRIT: 39.2 % (ref 36.0–46.0)
HEMOGLOBIN: 13.9 g/dL (ref 12.0–15.0)
Lymphocytes Relative: 4 % — ABNORMAL LOW (ref 12–46)
Lymphs Abs: 0.8 10*3/uL (ref 0.7–4.0)
MCH: 31.9 pg (ref 26.0–34.0)
MCHC: 35.5 g/dL (ref 30.0–36.0)
MCV: 89.9 fL (ref 78.0–100.0)
MONO ABS: 1.7 10*3/uL — AB (ref 0.1–1.0)
MONOS PCT: 9 % (ref 3–12)
NEUTROS ABS: 16.3 10*3/uL — AB (ref 1.7–7.7)
Neutrophils Relative %: 87 % — ABNORMAL HIGH (ref 43–77)
Platelets: 179 10*3/uL (ref 150–400)
RBC: 4.36 MIL/uL (ref 3.87–5.11)
RDW: 13.3 % (ref 11.5–15.5)
WBC: 18.8 10*3/uL — ABNORMAL HIGH (ref 4.0–10.5)

## 2013-02-09 LAB — TROPONIN I

## 2013-02-09 LAB — LIPASE, BLOOD: LIPASE: 1803 U/L — AB (ref 11–59)

## 2013-02-09 LAB — CG4 I-STAT (LACTIC ACID): Lactic Acid, Venous: 2.85 mmol/L — ABNORMAL HIGH (ref 0.5–2.2)

## 2013-02-09 MED ORDER — ONDANSETRON HCL 4 MG/2ML IJ SOLN
4.0000 mg | Freq: Once | INTRAMUSCULAR | Status: AC
  Administered 2013-02-09: 4 mg via INTRAVENOUS
  Filled 2013-02-09: qty 2

## 2013-02-09 MED ORDER — ADULT MULTIVITAMIN W/MINERALS CH
1.0000 | ORAL_TABLET | Freq: Every day | ORAL | Status: DC
  Administered 2013-02-09 – 2013-02-11 (×2): 1 via ORAL
  Filled 2013-02-09 (×3): qty 1

## 2013-02-09 MED ORDER — ONDANSETRON HCL 4 MG/2ML IJ SOLN
4.0000 mg | Freq: Four times a day (QID) | INTRAMUSCULAR | Status: DC | PRN
  Administered 2013-02-09 – 2013-02-11 (×9): 4 mg via INTRAVENOUS
  Filled 2013-02-09 (×9): qty 2

## 2013-02-09 MED ORDER — FENTANYL CITRATE 0.05 MG/ML IJ SOLN
50.0000 ug | Freq: Once | INTRAMUSCULAR | Status: AC
  Administered 2013-02-09: 50 ug via INTRAVENOUS
  Filled 2013-02-09: qty 2

## 2013-02-09 MED ORDER — IOHEXOL 350 MG/ML SOLN
70.0000 mL | Freq: Once | INTRAVENOUS | Status: AC | PRN
  Administered 2013-02-09: 70 mL via INTRAVENOUS

## 2013-02-09 MED ORDER — LEVALBUTEROL HCL 0.63 MG/3ML IN NEBU
0.6300 mg | INHALATION_SOLUTION | Freq: Four times a day (QID) | RESPIRATORY_TRACT | Status: DC | PRN
  Administered 2013-02-09 – 2013-02-11 (×4): 0.63 mg via RESPIRATORY_TRACT
  Filled 2013-02-09 (×4): qty 3

## 2013-02-09 MED ORDER — THIAMINE HCL 100 MG/ML IJ SOLN
100.0000 mg | Freq: Every day | INTRAMUSCULAR | Status: DC
  Filled 2013-02-09 (×3): qty 1

## 2013-02-09 MED ORDER — HYDROMORPHONE HCL PF 2 MG/ML IJ SOLN
2.0000 mg | INTRAMUSCULAR | Status: DC | PRN
  Administered 2013-02-09: 2 mg via INTRAVENOUS
  Filled 2013-02-09: qty 1

## 2013-02-09 MED ORDER — NALOXONE HCL 0.4 MG/ML IJ SOLN: 0.4000 mg | INTRAMUSCULAR | Status: DC | PRN

## 2013-02-09 MED ORDER — HYDROMORPHONE HCL PF 1 MG/ML IJ SOLN
1.0000 mg | Freq: Once | INTRAMUSCULAR | Status: AC
  Administered 2013-02-09: 1 mg via INTRAVENOUS
  Filled 2013-02-09: qty 1

## 2013-02-09 MED ORDER — DIPHENHYDRAMINE HCL 50 MG/ML IJ SOLN: 12.5000 mg | Freq: Four times a day (QID) | INTRAMUSCULAR | Status: DC | PRN

## 2013-02-09 MED ORDER — DIPHENHYDRAMINE HCL 12.5 MG/5ML PO ELIX
12.5000 mg | ORAL_SOLUTION | Freq: Four times a day (QID) | ORAL | Status: DC | PRN
Start: 2013-02-09 — End: 2013-02-10

## 2013-02-09 MED ORDER — LORAZEPAM 2 MG/ML IJ SOLN
2.0000 mg | Freq: Four times a day (QID) | INTRAMUSCULAR | Status: DC | PRN
  Administered 2013-02-09 – 2013-02-10 (×2): 2 mg via INTRAVENOUS
  Filled 2013-02-09: qty 1

## 2013-02-09 MED ORDER — LORAZEPAM 1 MG PO TABS: 1.0000 mg | ORAL_TABLET | Freq: Four times a day (QID) | ORAL | Status: DC | PRN

## 2013-02-09 MED ORDER — SODIUM CHLORIDE 0.9 % IJ SOLN
3.0000 mL | Freq: Two times a day (BID) | INTRAMUSCULAR | Status: DC
  Administered 2013-02-09: 3 mL via INTRAVENOUS

## 2013-02-09 MED ORDER — IOHEXOL 300 MG/ML  SOLN
50.0000 mL | Freq: Once | INTRAMUSCULAR | Status: AC | PRN
  Administered 2013-02-09: 50 mL via ORAL

## 2013-02-09 MED ORDER — SODIUM CHLORIDE 0.9 % IJ SOLN: 9.0000 mL | INTRAMUSCULAR | Status: DC | PRN

## 2013-02-09 MED ORDER — PNEUMOCOCCAL VAC POLYVALENT 25 MCG/0.5ML IJ INJ
0.5000 mL | INJECTION | INTRAMUSCULAR | Status: AC
  Administered 2013-02-11: 0.5 mL via INTRAMUSCULAR
  Filled 2013-02-09 (×3): qty 0.5

## 2013-02-09 MED ORDER — NICOTINE 14 MG/24HR TD PT24
14.0000 mg | MEDICATED_PATCH | Freq: Every day | TRANSDERMAL | Status: DC
  Administered 2013-02-09 – 2013-02-11 (×3): 14 mg via TRANSDERMAL
  Filled 2013-02-09 (×3): qty 1

## 2013-02-09 MED ORDER — LORAZEPAM 2 MG/ML IJ SOLN
1.0000 mg | Freq: Four times a day (QID) | INTRAMUSCULAR | Status: DC | PRN
  Administered 2013-02-09 (×3): 1 mg via INTRAVENOUS
  Filled 2013-02-09 (×3): qty 1

## 2013-02-09 MED ORDER — FOLIC ACID 1 MG PO TABS
1.0000 mg | ORAL_TABLET | Freq: Every day | ORAL | Status: DC
  Administered 2013-02-09 – 2013-02-11 (×3): 1 mg via ORAL
  Filled 2013-02-09 (×3): qty 1

## 2013-02-09 MED ORDER — LORAZEPAM 2 MG/ML IJ SOLN
0.0000 mg | Freq: Four times a day (QID) | INTRAMUSCULAR | Status: DC
  Administered 2013-02-10 (×2): 2 mg via INTRAVENOUS
  Administered 2013-02-10: 1 mg via INTRAVENOUS
  Administered 2013-02-11: 2 mg via INTRAVENOUS
  Administered 2013-02-11: 1 mg via INTRAVENOUS
  Filled 2013-02-09 (×6): qty 1

## 2013-02-09 MED ORDER — LORAZEPAM 1 MG PO TABS
2.0000 mg | ORAL_TABLET | Freq: Four times a day (QID) | ORAL | Status: DC | PRN
  Administered 2013-02-11: 2 mg via ORAL
  Filled 2013-02-09: qty 4

## 2013-02-09 MED ORDER — HEPARIN SODIUM (PORCINE) 5000 UNIT/ML IJ SOLN
5000.0000 [IU] | Freq: Three times a day (TID) | INTRAMUSCULAR | Status: DC
  Administered 2013-02-09 – 2013-02-11 (×7): 5000 [IU] via SUBCUTANEOUS
  Filled 2013-02-09 (×10): qty 1

## 2013-02-09 MED ORDER — SODIUM CHLORIDE 0.9 % IV BOLUS (SEPSIS)
1000.0000 mL | Freq: Once | INTRAVENOUS | Status: AC
  Administered 2013-02-09: 1000 mL via INTRAVENOUS

## 2013-02-09 MED ORDER — VITAMIN B-1 100 MG PO TABS
100.0000 mg | ORAL_TABLET | Freq: Every day | ORAL | Status: DC
  Administered 2013-02-09 – 2013-02-11 (×3): 100 mg via ORAL
  Filled 2013-02-09 (×3): qty 1

## 2013-02-09 MED ORDER — LORAZEPAM 2 MG/ML IJ SOLN
0.0000 mg | Freq: Two times a day (BID) | INTRAMUSCULAR | Status: DC
Start: 2013-02-12 — End: 2013-02-11

## 2013-02-09 MED ORDER — HYDROMORPHONE 0.3 MG/ML IV SOLN
INTRAVENOUS | Status: DC
  Administered 2013-02-09: 18:00:00 via INTRAVENOUS
  Administered 2013-02-09: 6.6 mg via INTRAVENOUS
  Administered 2013-02-10: 08:00:00 via INTRAVENOUS
  Administered 2013-02-10: 4.5 mg via INTRAVENOUS
  Filled 2013-02-09 (×3): qty 25

## 2013-02-09 MED ORDER — HYDROMORPHONE HCL PF 1 MG/ML IJ SOLN
1.0000 mg | INTRAMUSCULAR | Status: DC | PRN
  Administered 2013-02-09 (×3): 1 mg via INTRAVENOUS
  Filled 2013-02-09 (×3): qty 1

## 2013-02-09 MED ORDER — POTASSIUM CHLORIDE 10 MEQ/100ML IV SOLN
10.0000 meq | INTRAVENOUS | Status: AC
  Administered 2013-02-09 (×5): 10 meq via INTRAVENOUS
  Filled 2013-02-09 (×5): qty 100

## 2013-02-09 MED ORDER — SODIUM CHLORIDE 0.9 % IV SOLN
INTRAVENOUS | Status: DC
Start: 2013-02-09 — End: 2013-02-11
  Administered 2013-02-09 – 2013-02-10 (×5): via INTRAVENOUS

## 2013-02-09 NOTE — ED Notes (Signed)
Contacted floor to give report RN unable to receive report at this time will call back.

## 2013-02-09 NOTE — ED Notes (Signed)
Gold necklace and belongings sent to floor with pt.

## 2013-02-09 NOTE — ED Notes (Signed)
[facility] EDP given CG4 Lactic results.

## 2013-02-09 NOTE — Progress Notes (Signed)
Same Day Note H and P from this AM reviewed. Agree with assessment and plan. Pt presents with recurrent pancreatitis. Thus far NPO, stable. Presenting lipase over 1000. On CIWA for etoh withdrawals. Still complaining of epigastric pain. Cont NPO, IVF, and pain meds.

## 2013-02-09 NOTE — ED Notes (Signed)
Per EMS- Pt reports having abdominal pain for a week that became worse tonight. Pt admits to alcohol use today. Pt also reports being short of breathe and having cough. Wheezing in all field upon EMS arrival pt given two neb treatments 5mg  of albuterol and 5mg  albuterol with 0.5mg  of Atrovent. Pt is alert and oriented.

## 2013-02-09 NOTE — ED Notes (Signed)
Bed: RD40 Expected date:  Expected time:  Means of arrival:  Comments: EMS abd pain hx of pancreatitis, ETOH, shortness of breath

## 2013-02-09 NOTE — Evaluation (Signed)
I have reviewed the student nurses documentation.

## 2013-02-09 NOTE — H&P (Signed)
[facility] History and Physical  ARENA LINDAHL ZJI:[id number] DOB: 01/10/1950 DOA: 02/09/2013  Referring physician: EDP PCP: Ricke Hey, MD   Chief Complaint: Epigastric pain   HPI: [name] is a 63 y.o. female who presents to the ED with abdominal pain.  Located in the epigastric area, acutely onset 3 days ago and has been persistent since that time.  Multiple episodes of N/V today.  Does have history of chronic pancreatitis.  Review of Systems: Reports EtOH use, most recently today, didn't take her ativan at all today.  Systems reviewed.  As above, otherwise negative  Past Medical History  Diagnosis Date  . Pancreatitis   . COPD (chronic obstructive pulmonary disease)   . Substance abuse   . Cancer     renal ca  . Seizures   . Pancreatitis    Past Surgical History  Procedure Laterality Date  . Partial gastrectomy     Social History:  reports that she has been smoking Cigarettes.  She has been smoking about 0.50 packs per day. She has never used smokeless tobacco. She reports that she drinks alcohol. She reports that she uses illicit drugs.  Allergies  Allergen Reactions  . Aspirin Other (See Comments)    bleeding  . Chlorpromazine Hcl Other (See Comments)    Muscle spasms    History reviewed. No pertinent family history.   Prior to Admission medications   Medication Sig Start Date End Date Taking? Authorizing Provider  fluticasone (FLOVENT HFA) 110 MCG/ACT inhaler Inhale 2 puffs into the lungs 2 (two) times daily.   Yes Historical Provider, MD  HYDROmorphone (DILAUDID) 4 MG tablet Take 4 mg by mouth every 4 (four) hours as needed for severe pain.   Yes Historical Provider, MD  LORazepam (ATIVAN) 1 MG tablet Take 1 tablet (1 mg total) by mouth every 6 (six) hours as needed. For anxiety. 04/07/11  Yes Joaquim Lai C. Sanford, PA-C  naproxen sodium (ANAPROX) 220 MG tablet Take 440 mg by mouth 2 (two) times daily with a meal. Pain    Historical Provider, MD    Physical Exam: Filed Vitals:   02/09/13 0236  BP: 175/101  Pulse: 155  Temp:   Resp:     BP 175/101  Pulse 155  Temp(Src) 98.5 F (36.9 C) (Oral)  Resp 24  SpO2 99%  General Appearance:    Alert, oriented, no distress, appears stated age, generalized tremor.  Head:    Normocephalic, atraumatic  Eyes:    PERRL, EOMI, sclera non-icteric        Nose:   Nares without drainage or epistaxis. Mucosa, turbinates normal  Throat:   Moist mucous membranes. Oropharynx without erythema or exudate.  Neck:   Supple. No carotid bruits.  No thyromegaly.  No lymphadenopathy.   Back:     No CVA tenderness, no spinal tenderness  Lungs:     Clear to auscultation bilaterally, without wheezes, rhonchi or rales  Chest wall:    No tenderness to palpitation  Heart:    Tachycardic, no murmurs, gallops, rubs  Abdomen:     Soft, Tender in epigastrium, nondistended, normal bowel sounds, no organomegaly  Genitalia:    deferred  Rectal:    deferred  Extremities:   No clubbing, cyanosis or edema.  Pulses:   2+ and symmetric all extremities  Skin:   Skin color, texture, turgor normal, no rashes or lesions  Lymph nodes:   Cervical, supraclavicular, and axillary nodes normal  Neurologic:   CNII-XII intact.  Normal strength, sensation and reflexes      throughout    Labs on Admission:  Basic Metabolic Panel:  Recent Labs Lab 02/09/13 0120  NA 136*  K 3.1*  CL 96  CO2 22  GLUCOSE 116*  BUN 7  CREATININE 0.63  CALCIUM 8.9   Liver Function Tests:  Recent Labs Lab 02/09/13 0120  AST 119*  ALT 39*  ALKPHOS 161*  BILITOT 0.8  PROT 7.1  ALBUMIN 3.4*    Recent Labs Lab 02/09/13 0120  LIPASE 1803*   No results found for this basename: AMMONIA,  in the last 168 hours CBC:  Recent Labs Lab 02/09/13 0120  WBC 18.8*  NEUTROABS 16.3*  HGB 13.9  HCT 39.2  MCV 89.9  PLT 179   Cardiac Enzymes:  Recent Labs Lab 02/09/13 0120  TROPONINI <0.30    BNP (last 3 results) No results  found for this basename: PROBNP,  in the last 8760 hours CBG: No results found for this basename: GLUCAP,  in the last 168 hours  Radiological Exams on Admission: Dg Chest Port 1 View  02/09/2013   CLINICAL DATA:  Shortness of breath. Upper right. Rule out free air.  EXAM: PORTABLE CHEST - 1 VIEW  COMPARISON:  06/07/2011  FINDINGS: Hyperinflated lungs. No edema or consolidation. No effusion or pneumothorax. Normal heart size. No evidence of pneumoperitoneum in the upper abdomen.  IMPRESSION: 1. Stable exam.   No active disease. 2. No evidence of free peritoneal air.   Electronically Signed   By: Jorje Guild M.D.   On: 02/09/2013 01:26    EKG: Independently reviewed.  Assessment/Plan Principal Problem:   Acute alcoholic pancreatitis Active Problems:   PANCREATITIS, CHRONIC   Benzodiazepine withdrawal   1. Acute EtOH pancreatitis with h/o chronic pancreatitis - Patient NPO, on IVF, pain control, CT pending. 2. Benzo / EtOH withdrawal - patient placed on CIWA protocol to treat withdrawal symptoms. 3. Sinus Tachycardia - most likely due to a combination of dehydration from pancreatitis and withdrawal, treating both as above.    Code Status: Full Code  Family Communication: No family in room Disposition Plan: Admit to inpatient   Time spent: 70 min  GARDNER, JARED M. [facility] Pager [phone]  If 7AM-7PM, please contact the day team taking care of the patient [email or web address] Password [email or web address] 02/09/2013, 2:49 AM

## 2013-02-09 NOTE — Progress Notes (Signed)
INITIAL NUTRITION ASSESSMENT  DOCUMENTATION CODES Per approved criteria  -Severe malnutrition in the context of chronic illness -Underweight  Pt meets criteria for severe MALNUTRITION in the context of chronic illness as evidenced by PO intake <75% for > one month, severe loss of subcutaneous fat and muscle wasting in several regions of the body.   INTERVENTION: -Recommend Resource Breeze po TID, each supplement provides 250 kcal and 9 grams of protein as tolerated -Recommend Pro-Stat BID as tolerated -Diet advancement per MD -Will continue to monitor  NUTRITION DIAGNOSIS: Inadequate oral intake related to ETOH abuse/nausea as evidenced by PO intake <75%.   Goal: Pt to meet >/= 90% of their estimated nutrition needs    Monitor:  Diet advancement, GI profile, labs, weights  Reason for Assessment: MST/Underweight BMI   63 y.o. female  Admitting Dx: Acute alcoholic pancreatitis  ASSESSMENT: [name] is a 63 y.o. female who presents to the ED with abdominal pain. Located in the epigastric area, acutely onset 3 days ago and has been persistent since that time. Multiple episodes of N/V today. Does have history of chronic pancreatitis.  -Pt reported minimal intake and unintentional wt loss -Will go 3 days w/out any food intake, and will typically consume one meal/day when she does eat. Diet recall indicate meal usually at lunch or dinner- meatloaf, chicken, etc. Pt is unable to prepare meals for self d/t weakness, husband assists in grocery shopping and providing food. -Per discussion with RN, pt has extensive hx of ETOH abuse. Will consume 3 24-ounce beers daily. Has tried rehab multiple times but relapses. ETOH abuse likely contributing to suboptimal nutritional intake -Pt has tried Ensure/Boost supplements, but dislikes the taste and causes GI distress. Was willing to try Resource Breeze or other supplementation that would be easier to tolerate  -Pt noted weight loss has  occurrred, but was unable to quantify amount. Has always been small framed. Usual body weight ranges from 90-95 lbs. -Reported chronic nausea and pain. Pt on multiple pain and nausea medication at home per RN -No plan for diet advancement as pt continues with abd pain and hypoactive bowel sounds -Receiving IVFs -Nutrition Focused Physical Exam:  Subcutaneous Fat:  Orbital Region: severe Upper Arm Region: severe Thoracic and Lumbar Region: severe  Muscle:  Temple Region: severe Clavicle Bone Region: severe Clavicle and Acromion Bone Region: severe Scapular Bone Region: severe Dorsal Hand: severe Patellar Region: severe Anterior Thigh Region: severe Posterior Calf Region: severe  Edema: N/A    Height: Ht Readings from Last 1 Encounters:  02/09/13 5\' 4"  (1.626 m)    Weight: Wt Readings from Last 1 Encounters:  02/09/13 92 lb 9.5 oz (42 kg)    Ideal Body Weight: 120 lbs  % Ideal Body Weight: 77%  Wt Readings from Last 10 Encounters:  02/09/13 92 lb 9.5 oz (42 kg)  07/14/11 88 lb (39.917 kg)  02/25/11 203 lb 11.3 oz (92.4 kg)  12/02/10 145 lb 3 oz (65.857 kg)  08/02/10 80 lb (36.288 kg)  04/08/10 82 lb (37.195 kg)  10/18/08 95 lb 6.4 oz (43.273 kg)    Usual Body Weight: 90-95 lbs  % Usual Body Weight: 100%  BMI:  Body mass index is 15.89 kg/(m^2).  Estimated Nutritional Needs: Kcal: 1300-1500 Protein: 65-75 Fluid: 1500 ml/daily  Skin: WDL  Diet Order: NPO  EDUCATION NEEDS: -No education needs identified at this time   Intake/Output Summary (Last 24 hours) at 02/09/13 1212 Last data filed at 02/09/13 0600  Gross per  24 hour  Intake    250 ml  Output      0 ml  Net    250 ml    Last BM: 2/05   Labs:   Recent Labs Lab 02/09/13 0120  NA 136*  K 3.1*  CL 96  CO2 22  BUN 7  CREATININE 0.63  CALCIUM 8.9  GLUCOSE 116*    CBG (last 3)  No results found for this basename: GLUCAP,  in the last 72 hours  Scheduled Meds: . folic acid  1  mg Oral Daily  . heparin  5,000 Units Subcutaneous Q8H  . multivitamin with minerals  1 tablet Oral Daily  . [START ON 02/10/2013] pneumococcal 23 valent vaccine  0.5 mL Intramuscular Tomorrow-1000  . sodium chloride  3 mL Intravenous Q12H  . thiamine  100 mg Oral Daily   Or  . thiamine  100 mg Intravenous Daily    Continuous Infusions: . sodium chloride 125 mL/hr at 02/09/13 3524    Past Medical History  Diagnosis Date  . Pancreatitis   . COPD (chronic obstructive pulmonary disease)   . Substance abuse   . Cancer     renal ca  . Seizures   . Pancreatitis     Past Surgical History  Procedure Laterality Date  . Partial gastrectomy      Atlee Abide [place] RD LDN Clinical Dietitian [phone]

## 2013-02-09 NOTE — ED Provider Notes (Signed)
CSN: [id number]     Arrival date & time 02/09/13  0034 History   First MD Initiated Contact with Patient 02/09/13 0041     Chief Complaint  Patient presents with  . Shortness of Breath  . Alcohol Intoxication  . Abdominal Pain   (Consider location/radiation/quality/duration/timing/severity/associated sxs/prior Treatment) HPI Comments: Pt is a 63 year old female with hx of pancreatitis - has been drinking ETOH - states that she has been having bialteral rib and epigastric pain for the last 3 days - was acute in onset, persistent and associated with multiple episodes of n/v today (4).  Has had subjective fevers, no chills.  Some CP and SOB, no leg swelling, no trauma, no recent surgery - she does have hx of peptic ulcer disease s/p surgery 5 years ago by laparotomy.  This pain is persistent and gradually worsening. EMS gave the patient nebulize treatments of 5 mg x2, this did not help.  Patient is a 63 y.o. female presenting with shortness of breath, intoxication, and abdominal pain. The history is provided by the patient and medical records.  Shortness of Breath Associated symptoms: abdominal pain   Alcohol Intoxication Associated symptoms include abdominal pain and shortness of breath.  Abdominal Pain Associated symptoms: shortness of breath     Past Medical History  Diagnosis Date  . Pancreatitis   . COPD (chronic obstructive pulmonary disease)   . Substance abuse   . Cancer     renal ca  . Seizures   . Pancreatitis    Past Surgical History  Procedure Laterality Date  . Partial gastrectomy     History reviewed. No pertinent family history. History  Substance Use Topics  . Smoking status: Current Every Day Smoker -- 0.50 packs/day    Types: Cigarettes  . Smokeless tobacco: Never Used  . Alcohol Use: Yes     Comment: Hx of Alcohol abuse, reports she "still has an occasional beer", but drinks on her medications   OB History   Grav Para Term Preterm Abortions TAB SAB Ect  Mult Living                 Review of Systems  Respiratory: Positive for shortness of breath.   Gastrointestinal: Positive for abdominal pain.  All other systems reviewed and are negative.    Allergies  Aspirin and Chlorpromazine hcl  Home Medications   No current outpatient prescriptions on file. BP 156/89  Pulse 118  Temp(Src) 98.1 F (36.7 C) (Oral)  Resp 22  Ht 5\' 4"  (1.626 m)  Wt 92 lb 9.5 oz (42 kg)  BMI 15.89 kg/m2  SpO2 99% Physical Exam  Nursing note and vitals reviewed. Constitutional: She appears well-developed and well-nourished.  HENT:  Head: Normocephalic and atraumatic.  Mouth/Throat: No oropharyngeal exudate.  Dry mucous membranes, evidence of emesis in the oral cavity  Eyes: Conjunctivae and EOM are normal. Pupils are equal, round, and reactive to light. Right eye exhibits no discharge. Left eye exhibits no discharge. No scleral icterus.  Neck: Normal range of motion. Neck supple. No JVD present. No thyromegaly present.  Cardiovascular: Regular rhythm, normal heart sounds and intact distal pulses.  Exam reveals no gallop and no friction rub.   No murmur heard. Tachycardia to 160 beats per minute and a sinus tachycardia  Pulmonary/Chest: Effort normal and breath sounds normal. No respiratory distress. She has no wheezes. She has no rales.  Abdominal: Soft. Bowel sounds are normal. She exhibits no distension and no mass. There is tenderness (  diffuse abdominal tenderness, worse in the epigastrium).  Musculoskeletal: Normal range of motion. She exhibits no edema and no tenderness.  Lymphadenopathy:    She has no cervical adenopathy.  Neurological: She is alert. Coordination normal.  Skin: Skin is warm and dry. No rash noted. No erythema.  Psychiatric: She has a normal mood and affect. Her behavior is normal.    ED Course  Procedures (including critical care time) Labs Review Labs Reviewed  LIPASE, BLOOD - Abnormal; Notable for the following:    Lipase  1803 (*)    All other components within normal limits  CBC WITH DIFFERENTIAL - Abnormal; Notable for the following:    WBC 18.8 (*)    Neutrophils Relative % 87 (*)    Neutro Abs 16.3 (*)    Lymphocytes Relative 4 (*)    Monocytes Absolute 1.7 (*)    All other components within normal limits  COMPREHENSIVE METABOLIC PANEL - Abnormal; Notable for the following:    Sodium 136 (*)    Potassium 3.1 (*)    Glucose, Bld 116 (*)    Albumin 3.4 (*)    AST 119 (*)    ALT 39 (*)    Alkaline Phosphatase 161 (*)    All other components within normal limits  CG4 I-STAT (LACTIC ACID) - Abnormal; Notable for the following:    Lactic Acid, Venous 2.85 (*)    All other components within normal limits  TROPONIN I   Imaging Review Ct Angio Chest Pe W/cm &/or Wo Cm  02/09/2013   CLINICAL DATA:  Abdominal pain and pancreatitis. Shortness of breath and cough.  EXAM: CT ANGIOGRAPHY CHEST  CT ABDOMEN AND PELVIS WITH CONTRAST  TECHNIQUE: Multidetector CT imaging of the chest was performed using the standard protocol during bolus administration of intravenous contrast. Multiplanar CT image reconstructions including MIPs were obtained to evaluate the vascular anatomy. Multidetector CT imaging of the abdomen and pelvis was performed using the standard protocol during bolus administration of intravenous contrast.  CONTRAST:  52mL OMNIPAQUE IOHEXOL 350 MG/ML SOLN  COMPARISON:  Abdominal CT 10/28/2011.  FINDINGS: CTA CHEST FINDINGS  THORACIC INLET/BODY WALL:  No acute abnormality.  MEDIASTINUM:  Normal heart size. No pericardial effusion. Diffuse atherosclerosis, including the coronary arteries. No acute vascular abnormality, including pulmonary embolism or aortic dissection. No adenopathy.  LUNG WINDOWS:  No consolidation. Suspect mild centrilobular emphysema at the apices. No consolidation. Tiny subpleural nodular densities, especially the right upper lung appear most like scarring.  OSSEOUS:  No acute fracture.  No  suspicious lytic or blastic lesions.  CT ABDOMEN and PELVIS FINDINGS  BODY WALL: Unremarkable.  ABDOMEN/PELVIS:  Liver: Extensive fatty infiltration.  Biliary: Distended gallbladder, without visible stones or wall thickening. Chronic biliary ductal dilatation to 1 cm. No duct stone identified.  Pancreas: There is new diffuse parenchymal enlargement and peripancreatic fat infiltration. No fluid collection, necrosis, ductal dilatation, or vascular complication.  Spleen: Unremarkable.  Adrenals: Unremarkable.  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 This is reportedly a remote cryoablation site. No new enhancement to suggest tumor recurrence.  Bladder: Unremarkable.  Reproductive: Hysterectomy.  Bowel: No obstruction. No pericecal inflammation.  Retroperitoneum: No mass or adenopathy.  Peritoneum: No free fluid or gas.  Vascular: No acute abnormality.  OSSEOUS: ORIF of the proximal left femur. Imaged portions of the hardware are unremarkable.  Review of the MIP images confirms the above findings.  IMPRESSION: 1. Pancreatitis.  No necrosis, collection, or other complicating feature. 2. Negative for pulmonary embolism or other acute intrathoracic abnormality.   Electronically Signed   By: Jorje Guild M.D.   On: 02/09/2013 04:08   Ct Abdomen Pelvis W Contrast  02/09/2013   CLINICAL DATA:  Abdominal pain and pancreatitis. Shortness of breath and cough.  EXAM: CT ANGIOGRAPHY CHEST  CT ABDOMEN AND PELVIS WITH CONTRAST  TECHNIQUE: Multidetector CT imaging of the chest was performed using the standard protocol during bolus administration of intravenous contrast. Multiplanar CT image reconstructions including MIPs were obtained to evaluate the vascular anatomy. Multidetector CT imaging of the abdomen and pelvis was performed using the standard protocol during bolus administration of intravenous contrast.  CONTRAST:  66mL OMNIPAQUE IOHEXOL 350 MG/ML  SOLN  COMPARISON:  Abdominal CT 10/28/2011.  FINDINGS: CTA CHEST FINDINGS  THORACIC INLET/BODY WALL:  No acute abnormality.  MEDIASTINUM:  Normal heart size. No pericardial effusion. Diffuse atherosclerosis, including the coronary arteries. No acute vascular abnormality, including pulmonary embolism or aortic dissection. No adenopathy.  LUNG WINDOWS:  No consolidation. Suspect mild centrilobular emphysema at the apices. No consolidation. Tiny subpleural nodular densities, especially the right upper lung appear most like scarring.  OSSEOUS:  No acute fracture.  No suspicious lytic or blastic lesions.  CT ABDOMEN and PELVIS FINDINGS  BODY WALL: Unremarkable.  ABDOMEN/PELVIS:  Liver: Extensive fatty infiltration.  Biliary: Distended gallbladder, without visible stones or wall thickening. Chronic biliary ductal dilatation to 1 cm. No duct stone identified.  Pancreas: There is new diffuse parenchymal enlargement and peripancreatic fat infiltration. No fluid collection, necrosis, ductal dilatation, or vascular complication.  Spleen: Unremarkable.  Adrenals: Unremarkable.  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 This is reportedly a remote cryoablation site. No new enhancement to suggest tumor recurrence.  Bladder: Unremarkable.  Reproductive: Hysterectomy.  Bowel: No obstruction. No pericecal inflammation.  Retroperitoneum: No mass or adenopathy.  Peritoneum: No free fluid or gas.  Vascular: No acute abnormality.  OSSEOUS: ORIF of the proximal left femur. Imaged portions of the hardware are unremarkable.  Review of the MIP images confirms the above findings.  IMPRESSION: 1. Pancreatitis. No necrosis, collection, or other complicating feature. 2. Negative for pulmonary embolism or other acute intrathoracic abnormality.   Electronically Signed   By: Jorje Guild M.D.   On: 02/09/2013 04:08   Dg Chest Port 1 View  02/09/2013   CLINICAL DATA:   Shortness of breath. Upper right. Rule out free air.  EXAM: PORTABLE CHEST - 1 VIEW  COMPARISON:  06/07/2011  FINDINGS: Hyperinflated lungs. No edema or consolidation. No effusion or pneumothorax. Normal heart size. No evidence of pneumoperitoneum in the upper abdomen.  IMPRESSION: 1. Stable exam.   No active disease. 2. No evidence of free peritoneal air.   Electronically Signed   By: Jorje Guild M.D.   On: 02/09/2013 01:26    ED ECG REPORT  I personally interpreted this EKG   Date: 02/09/2013   Rate: 157  Rhythm: sinus tachycardia  QRS Axis: normal  Intervals: normal  ST/T Wave abnormalities: nonspecific ST/T changes  Conduction Disutrbances:none  Narrative Interpretation: since last tracing, rate increased  Old EKG Reviewed: changes noted   MDM   1. Acute alcoholic pancreatitis   2. Benzodiazepine withdrawal   3. Chronic pancreatitis    The patient has significant tachycardia, this is likely related to dehydration, pain and ongoing nausea and vomiting. However the patient does  have a history of renal cell carcinoma, history of substance abuse and alcohol abuse and has bilateral rib pain which she states is slightly abnormal for her. Despite her pain being reproducible I would also consider pulmonary embolism is a possibility. We'll start with IV fluids, laboratory workup, antiemetics and pain medication, chest x-ray to rule out free air due to history of stomach ulcers and chronic alcohol use.  Angiocath insertion Performed by: Johnna Acosta  Consent: Verbal consent obtained. Risks and benefits: risks, benefits and alternatives were discussed Time out: Immediately prior to procedure a "time out" was called to verify the correct patient, procedure, equipment, support staff and site/side marked as required.  Preparation: Patient was prepped and draped in the usual sterile fashion.  Vein Location: Left external jugular vein  Not Ultrasound Guided  Gauge: 20  Normal blood  return and flush without difficulty Patient tolerance: Patient tolerated the procedure well with no immediate complications.   Lipase is significantly elevated at 1800, leukocytosis present of close to 19,000 and an elevated lactic acid 2.85. There is a slight transaminitis but given the patient's underlying alcohol use and a ratio of AST to ALT I suspect this is alcohol related and not due to a primary hepatic or cholecystic abnormality.  CT scan of the abdomen and pelvis as well as the chest was performed, there is no signs of pulmonary embolism, pancreatitis present without complicating factors. No free air seen on imaging. The patient required multiple IV fluid boluses, multiple doses of pain medication and stayed persistently tachycardic. Care was discussed with the hospitalist Dr. Alcario Drought who agreed to admit the patient to the hospital.  Meds given in ED:  Medications  LORazepam (ATIVAN) tablet 1 mg ( Oral See Alternative 02/09/13 0259)    Or  LORazepam (ATIVAN) injection 1 mg (1 mg Intravenous Given 02/09/13 0259)  thiamine (VITAMIN B-1) tablet 100 mg (not administered)    Or  thiamine (B-1) injection 100 mg (not administered)  folic acid (FOLVITE) tablet 1 mg (not administered)  multivitamin with minerals tablet 1 tablet (not administered)  HYDROmorphone (DILAUDID) injection 1 mg (1 mg Intravenous Given 02/09/13 0311)  0.9 %  sodium chloride infusion (not administered)  heparin injection 5,000 Units (not administered)  sodium chloride 0.9 % injection 3 mL (not administered)  ondansetron (ZOFRAN) injection 4 mg (not administered)  HYDROmorphone (DILAUDID) injection 1 mg (not administered)  sodium chloride 0.9 % bolus 1,000 mL (1,000 mLs Intravenous New Bag/Given 02/09/13 0202)  sodium chloride 0.9 % bolus 1,000 mL (0 mLs Intravenous Stopped 02/09/13 0202)  ondansetron (ZOFRAN) injection 4 mg (4 mg Intravenous Given 02/09/13 0134)  HYDROmorphone (DILAUDID) injection 1 mg (1 mg Intravenous Given  02/09/13 0135)  fentaNYL (SUBLIMAZE) injection 50 mcg (50 mcg Intravenous Given 02/09/13 0202)  iohexol (OMNIPAQUE) 300 MG/ML solution 50 mL (50 mLs Oral Contrast Given 02/09/13 0237)  iohexol (OMNIPAQUE) 350 MG/ML injection 70 mL (70 mLs Intravenous Contrast Given 02/09/13 0332)      Johnna Acosta, MD 02/09/13 [phone]

## 2013-02-10 DIAGNOSIS — R109 Unspecified abdominal pain: Secondary | ICD-10-CM

## 2013-02-10 DIAGNOSIS — R52 Pain, unspecified: Secondary | ICD-10-CM

## 2013-02-10 DIAGNOSIS — J441 Chronic obstructive pulmonary disease with (acute) exacerbation: Secondary | ICD-10-CM

## 2013-02-10 LAB — BASIC METABOLIC PANEL
BUN: 10 mg/dL (ref 6–23)
CALCIUM: 7.7 mg/dL — AB (ref 8.4–10.5)
CO2: 20 meq/L (ref 19–32)
CREATININE: 0.56 mg/dL (ref 0.50–1.10)
Chloride: 105 mEq/L (ref 96–112)
GFR calc Af Amer: >90 mL/min (ref >90)
GFR calc non Af Amer: >90 mL/min (ref >90)
Glucose, Bld: 61 mg/dL — ABNORMAL LOW (ref 70–99)
Potassium: 3.8 mEq/L (ref 3.7–5.3)
Sodium: 137 mEq/L (ref 137–147)

## 2013-02-10 LAB — LIPASE, BLOOD: LIPASE: 188 U/L — AB (ref 11–59)

## 2013-02-10 MED ORDER — IBUPROFEN 400 MG PO TABS
400.0000 mg | ORAL_TABLET | Freq: Once | ORAL | Status: AC
  Administered 2013-02-10: 400 mg via ORAL
  Filled 2013-02-10: qty 1

## 2013-02-10 MED ORDER — HYDROMORPHONE HCL PF 2 MG/ML IJ SOLN
2.0000 mg | INTRAMUSCULAR | Status: DC | PRN
  Administered 2013-02-10 – 2013-02-11 (×4): 2 mg via INTRAVENOUS
  Filled 2013-02-10 (×4): qty 1

## 2013-02-10 MED ORDER — HYDROMORPHONE HCL PF 1 MG/ML IJ SOLN: 1.0000 mg | INTRAMUSCULAR | Status: DC | PRN

## 2013-02-10 NOTE — Progress Notes (Signed)
[facility] PROGRESS NOTE  [name] ZOX:[id number] DOB: 04-27-1950 DOA: 02/09/2013 PCP: Ricke Hey, MD  Assessment/Plan: 1. Pancreatitis 1. Lipase much improved this AM 2. Pt reports feeling better and is requesting diet 3. Start trial of clear liquids 2. ETOH abuse 1. CIWA protocol 3. COPD 1. Stable 4. DVT prophylaxis 1. Heparin subQ  Code Status: Full Family Communication: Pt in room (indicate person spoken with, relationship, and if by phone, the number) Disposition Plan: Pending   Consultants:    Procedures:    Antibiotics:   (indicate start date, and stop date if known)  HPI/Subjective: Feels better. Wants to eat.  Objective: Filed Vitals:   02/09/13 2345 02/10/13 0416 02/10/13 0539 02/10/13 0923  BP:   158/102   Pulse:   75   Temp:   97.3 F (36.3 C)   TempSrc:   Oral   Resp: 15 14 14    Height:      Weight:      SpO2: 100% 100% 95% 95%    Intake/Output Summary (Last 24 hours) at 02/10/13 1100 Last data filed at 02/10/13 0748  Gross per 24 hour  Intake 3262.5 ml  Output    850 ml  Net 2412.5 ml   Filed Weights   02/09/13 0412  Weight: 42 kg (92 lb 9.5 oz)    Exam:   General:  Awake, in nad  Cardiovascular: regular, s1, s2  Respiratory: normal resp effort, no wheezing  Abdomen: soft, mildly tender in epigastric region, improved from yesterday  Musculoskeletal: perfused, no clubbing   Data Reviewed: Basic Metabolic Panel:  Recent Labs Lab 02/09/13 0120 02/10/13 0430  NA 136* 137  K 3.1* 3.8  CL 96 105  CO2 22 20  GLUCOSE 116* 61*  BUN 7 10  CREATININE 0.63 0.56  CALCIUM 8.9 7.7*   Liver Function Tests:  Recent Labs Lab 02/09/13 0120  AST 119*  ALT 39*  ALKPHOS 161*  BILITOT 0.8  PROT 7.1  ALBUMIN 3.4*    Recent Labs Lab 02/09/13 0120 02/10/13 0430  LIPASE 1803* 188*   No results found for this basename: AMMONIA,  in the last 168 hours CBC:  Recent Labs Lab 02/09/13 0120  WBC  18.8*  NEUTROABS 16.3*  HGB 13.9  HCT 39.2  MCV 89.9  PLT 179   Cardiac Enzymes:  Recent Labs Lab 02/09/13 0120  TROPONINI <0.30   BNP (last 3 results) No results found for this basename: PROBNP,  in the last 8760 hours CBG: No results found for this basename: GLUCAP,  in the last 168 hours  No results found for this or any previous visit (from the past 240 hour(s)).   Studies: Ct Angio Chest Pe W/cm &/or Wo Cm  02/09/2013   CLINICAL DATA:  Abdominal pain and pancreatitis. Shortness of breath and cough.  EXAM: CT ANGIOGRAPHY CHEST  CT ABDOMEN AND PELVIS WITH CONTRAST  TECHNIQUE: Multidetector CT imaging of the chest was performed using the standard protocol during bolus administration of intravenous contrast. Multiplanar CT image reconstructions including MIPs were obtained to evaluate the vascular anatomy. Multidetector CT imaging of the abdomen and pelvis was performed using the standard protocol during bolus administration of intravenous contrast.  CONTRAST:  76mL OMNIPAQUE IOHEXOL 350 MG/ML SOLN  COMPARISON:  Abdominal CT 10/28/2011.  FINDINGS: CTA CHEST FINDINGS  THORACIC INLET/BODY WALL:  No acute abnormality.  MEDIASTINUM:  Normal heart size. No pericardial effusion. Diffuse atherosclerosis, including the coronary arteries. No acute vascular abnormality, including pulmonary embolism  or aortic dissection. No adenopathy.  LUNG WINDOWS:  No consolidation. Suspect mild centrilobular emphysema at the apices. No consolidation. Tiny subpleural nodular densities, especially the right upper lung appear most like scarring.  OSSEOUS:  No acute fracture.  No suspicious lytic or blastic lesions.  CT ABDOMEN and PELVIS FINDINGS  BODY WALL: Unremarkable.  ABDOMEN/PELVIS:  Liver: Extensive fatty infiltration.  Biliary: Distended gallbladder, without visible stones or wall thickening. Chronic biliary ductal dilatation to 1 cm. No duct stone identified.  Pancreas: There is new diffuse parenchymal  enlargement and peripancreatic fat infiltration. No fluid collection, necrosis, ductal dilatation, or vascular complication.  Spleen: Unremarkable.  Adrenals: Unremarkable.  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 This is reportedly a remote cryoablation site. No new enhancement to suggest tumor recurrence.  Bladder: Unremarkable.  Reproductive: Hysterectomy.  Bowel: No obstruction. No pericecal inflammation.  Retroperitoneum: No mass or adenopathy.  Peritoneum: No free fluid or gas.  Vascular: No acute abnormality.  OSSEOUS: ORIF of the proximal left femur. Imaged portions of the hardware are unremarkable.  Review of the MIP images confirms the above findings.  IMPRESSION: 1. Pancreatitis. No necrosis, collection, or other complicating feature. 2. Negative for pulmonary embolism or other acute intrathoracic abnormality.   Electronically Signed   By: Jorje Guild M.D.   On: 02/09/2013 04:08   Ct Abdomen Pelvis W Contrast  02/09/2013   CLINICAL DATA:  Abdominal pain and pancreatitis. Shortness of breath and cough.  EXAM: CT ANGIOGRAPHY CHEST  CT ABDOMEN AND PELVIS WITH CONTRAST  TECHNIQUE: Multidetector CT imaging of the chest was performed using the standard protocol during bolus administration of intravenous contrast. Multiplanar CT image reconstructions including MIPs were obtained to evaluate the vascular anatomy. Multidetector CT imaging of the abdomen and pelvis was performed using the standard protocol during bolus administration of intravenous contrast.  CONTRAST:  65mL OMNIPAQUE IOHEXOL 350 MG/ML SOLN  COMPARISON:  Abdominal CT 10/28/2011.  FINDINGS: CTA CHEST FINDINGS  THORACIC INLET/BODY WALL:  No acute abnormality.  MEDIASTINUM:  Normal heart size. No pericardial effusion. Diffuse atherosclerosis, including the coronary arteries. No acute vascular abnormality, including pulmonary embolism or aortic dissection. No adenopathy.   LUNG WINDOWS:  No consolidation. Suspect mild centrilobular emphysema at the apices. No consolidation. Tiny subpleural nodular densities, especially the right upper lung appear most like scarring.  OSSEOUS:  No acute fracture.  No suspicious lytic or blastic lesions.  CT ABDOMEN and PELVIS FINDINGS  BODY WALL: Unremarkable.  ABDOMEN/PELVIS:  Liver: Extensive fatty infiltration.  Biliary: Distended gallbladder, without visible stones or wall thickening. Chronic biliary ductal dilatation to 1 cm. No duct stone identified.  Pancreas: There is new diffuse parenchymal enlargement and peripancreatic fat infiltration. No fluid collection, necrosis, ductal dilatation, or vascular complication.  Spleen: Unremarkable.  Adrenals: Unremarkable.  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 This is reportedly a remote cryoablation site. No new enhancement to suggest tumor recurrence.  Bladder: Unremarkable.  Reproductive: Hysterectomy.  Bowel: No obstruction. No pericecal inflammation.  Retroperitoneum: No mass or adenopathy.  Peritoneum: No free fluid or gas.  Vascular: No acute abnormality.  OSSEOUS: ORIF of the proximal left femur. Imaged portions of the hardware are unremarkable.  Review of the MIP images confirms the above findings.  IMPRESSION: 1. Pancreatitis. No necrosis, collection, or other complicating feature. 2. Negative for pulmonary embolism or other acute  intrathoracic abnormality.   Electronically Signed   By: Jorje Guild M.D.   On: 02/09/2013 04:08   Dg Chest Port 1 View  02/09/2013   CLINICAL DATA:  Shortness of breath. Upper right. Rule out free air.  EXAM: PORTABLE CHEST - 1 VIEW  COMPARISON:  06/07/2011  FINDINGS: Hyperinflated lungs. No edema or consolidation. No effusion or pneumothorax. Normal heart size. No evidence of pneumoperitoneum in the upper abdomen.  IMPRESSION: 1. Stable exam.   No active disease. 2. No evidence of free  peritoneal air.   Electronically Signed   By: Jorje Guild M.D.   On: 02/09/2013 01:26    Scheduled Meds: . folic acid  1 mg Oral Daily  . heparin  5,000 Units Subcutaneous Q8H  . HYDROmorphone PCA 0.3 mg/mL   Intravenous Q4H  . LORazepam  0-4 mg Intravenous Q6H   Followed by  . [START ON 02/12/2013] LORazepam  0-4 mg Intravenous Q12H  . multivitamin with minerals  1 tablet Oral Daily  . nicotine  14 mg Transdermal Daily  . pneumococcal 23 valent vaccine  0.5 mL Intramuscular Tomorrow-1000  . sodium chloride  3 mL Intravenous Q12H  . thiamine  100 mg Oral Daily   Or  . thiamine  100 mg Intravenous Daily   Continuous Infusions: . sodium chloride 125 mL/hr at 02/10/13 4360    Principal Problem:   Acute alcoholic pancreatitis Active Problems:   PANCREATITIS, CHRONIC   Benzodiazepine withdrawal   Protein-calorie malnutrition, severe  Time spent: 33min  Jyoti Harju, [facility] Pager [phone]. If 7PM-7AM, please contact night-coverage at [email or web address], password [facility] 02/10/2013, 11:00 AM  LOS: 1 day

## 2013-02-11 DIAGNOSIS — F411 Generalized anxiety disorder: Secondary | ICD-10-CM

## 2013-02-11 LAB — LIPASE, BLOOD: Lipase: 75 U/L — ABNORMAL HIGH (ref 11–59)

## 2013-02-11 MED ORDER — HYDROMORPHONE HCL 4 MG PO TABS: 4.0000 mg | ORAL_TABLET | ORAL | Status: DC | PRN

## 2013-02-11 MED ORDER — HYDROMORPHONE HCL 4 MG PO TABS
4.0000 mg | ORAL_TABLET | ORAL | Status: DC | PRN
  Administered 2013-02-11: 4 mg via ORAL
  Filled 2013-02-11: qty 1

## 2013-02-11 NOTE — Progress Notes (Signed)
[facility] PROGRESS NOTE  [name] WIO:[id number] DOB: Mar 12, 1950 DOA: 02/09/2013 PCP: Ricke Hey, MD  Assessment/Plan: 1. Pancreatitis 1. Lipase much improved this AM 2. Tolerating diet 3. Transition to PO pain meds 2. ETOH abuse 1. CIWA protocol 3. COPD 1. Stable 4. DVT prophylaxis 1. Heparin subQ 5. Weakness 1. Consult PT/OT  Code Status: Full Family Communication: Pt in room (indicate person spoken with, relationship, and if by phone, the number) Disposition Plan: Pending   Consultants:    Procedures:    Antibiotics:   (indicate start date, and stop date if known)  HPI/Subjective: Feels better. Feels weak  Objective: Filed Vitals:   02/11/13 0424 02/11/13 0600 02/11/13 0800 02/11/13 0849  BP: 182/98 148/82    Pulse: 94     Temp: 97.5 F (36.4 C)     TempSrc: Oral     Resp: 18     Height:      Weight:      SpO2: 93%  100% 98%    Intake/Output Summary (Last 24 hours) at 02/11/13 1210 Last data filed at 02/11/13 1127  Gross per 24 hour  Intake   1500 ml  Output   3350 ml  Net  -1850 ml   Filed Weights   02/09/13 0412  Weight: 42 kg (92 lb 9.5 oz)    Exam:   General:  Awake, in nad  Cardiovascular: regular, s1, s2  Respiratory: normal resp effort, no wheezing  Abdomen: soft, mildly tender in epigastric region, improved from yesterday  Musculoskeletal: perfused, no clubbing   Data Reviewed: Basic Metabolic Panel:  Recent Labs Lab 02/09/13 0120 02/10/13 0430  NA 136* 137  K 3.1* 3.8  CL 96 105  CO2 22 20  GLUCOSE 116* 61*  BUN 7 10  CREATININE 0.63 0.56  CALCIUM 8.9 7.7*   Liver Function Tests:  Recent Labs Lab 02/09/13 0120  AST 119*  ALT 39*  ALKPHOS 161*  BILITOT 0.8  PROT 7.1  ALBUMIN 3.4*    Recent Labs Lab 02/09/13 0120 02/10/13 0430 02/11/13 0413  LIPASE 1803* 188* 75*   No results found for this basename: AMMONIA,  in the last 168 hours CBC:  Recent Labs Lab 02/09/13 0120   WBC 18.8*  NEUTROABS 16.3*  HGB 13.9  HCT 39.2  MCV 89.9  PLT 179   Cardiac Enzymes:  Recent Labs Lab 02/09/13 0120  TROPONINI <0.30   BNP (last 3 results) No results found for this basename: PROBNP,  in the last 8760 hours CBG: No results found for this basename: GLUCAP,  in the last 168 hours  No results found for this or any previous visit (from the past 240 hour(s)).   Studies: No results found.  Scheduled Meds: . folic acid  1 mg Oral Daily  . heparin  5,000 Units Subcutaneous Q8H  . LORazepam  0-4 mg Intravenous Q6H   Followed by  . [START ON 02/12/2013] LORazepam  0-4 mg Intravenous Q12H  . multivitamin with minerals  1 tablet Oral Daily  . nicotine  14 mg Transdermal Daily  . pneumococcal 23 valent vaccine  0.5 mL Intramuscular Tomorrow-1000  . sodium chloride  3 mL Intravenous Q12H  . thiamine  100 mg Oral Daily   Or  . thiamine  100 mg Intravenous Daily   Continuous Infusions: . sodium chloride 125 mL/hr at 02/10/13 2248    Principal Problem:   Acute alcoholic pancreatitis Active Problems:   PANCREATITIS, CHRONIC   Benzodiazepine withdrawal  Protein-calorie malnutrition, severe  Time spent: 55min  [name], [facility] Pager [phone]. If 7PM-7AM, please contact night-coverage at [email or web address], password [facility] 02/11/2013, 12:10 PM  LOS: 2 days

## 2013-02-11 NOTE — Discharge Instructions (Signed)
Stop drinking alcohol!! °

## 2013-02-11 NOTE — Discharge Summary (Signed)
Physician Discharge Summary  [name] NLZ:[id number] DOB: 07/01/1950 DOA: 02/09/2013  PCP: Ricke Hey, MD  Admit date: 02/09/2013 Discharge date: 02/11/2013  Time spent: 40 minutes  Recommendations for Outpatient Follow-up:  1. Follow up with PCP in 1 week 2. ETOH cessation done  Discharge Diagnoses:  Principal Problem:   Acute alcoholic pancreatitis Active Problems:   PANCREATITIS, CHRONIC   Benzodiazepine withdrawal   Protein-calorie malnutrition, severe   Discharge Condition: Improved  Diet recommendation: Regular  Filed Weights   02/09/13 0412  Weight: 42 kg (92 lb 9.5 oz)    History of present illness:  [name] is a 63 y.o. female who presents to the ED with abdominal pain. Located in the epigastric area, acutely onset 3 days ago and has been persistent since that time. Multiple episodes of N/V today. Does have history of chronic pancreatitis.  Hospital Course:  Pancreatitis  1. Lipase with peak of just below 2000, much improved by discharge 2. Tolerating diet 3. Required PCA for pain and later transitioned to PO pain meds ETOH abuse  1. Was cont on CIWA protocol 2. 26min ETOH cessation was done face to face COPD  1. Stable DVT prophylaxis  1. Cont on Heparin subQ  Discharge Exam: Filed Vitals:   02/11/13 0600 02/11/13 0800 02/11/13 0849 02/11/13 1355  BP: 148/82   169/99  Pulse:    92  Temp:    98.3 F (36.8 C)  TempSrc:    Oral  Resp:    20  Height:      Weight:      SpO2:  100% 98% 99%    General: Awake, in nad Cardiovascular: regular, s1, s2 Respiratory: normal resp effort, no wheezing  Discharge Instructions     Medication List    ASK your doctor about these medications       fluticasone 110 MCG/ACT inhaler  Commonly known as:  FLOVENT HFA  Inhale 2 puffs into the lungs 2 (two) times daily.     HYDROmorphone 4 MG tablet  Commonly known as:  DILAUDID  Take 4 mg by mouth every 4 (four) hours as needed for severe  pain.     LORazepam 1 MG tablet  Commonly known as:  ATIVAN  Take 1 tablet (1 mg total) by mouth every 6 (six) hours as needed. For anxiety.     naproxen sodium 220 MG tablet  Commonly known as:  ANAPROX  Take 440 mg by mouth 2 (two) times daily with a meal. Pain       Allergies  Allergen Reactions  . Aspirin Other (See Comments)    bleeding  . Chlorpromazine Hcl Other (See Comments)    Muscle spasms      The results of significant diagnostics from this hospitalization (including imaging, microbiology, ancillary and laboratory) are listed below for reference.    Significant Diagnostic Studies: Ct Angio Chest Pe W/cm &/or Wo Cm  02/09/2013   CLINICAL DATA:  Abdominal pain and pancreatitis. Shortness of breath and cough.  EXAM: CT ANGIOGRAPHY CHEST  CT ABDOMEN AND PELVIS WITH CONTRAST  TECHNIQUE: Multidetector CT imaging of the chest was performed using the standard protocol during bolus administration of intravenous contrast. Multiplanar CT image reconstructions including MIPs were obtained to evaluate the vascular anatomy. Multidetector CT imaging of the abdomen and pelvis was performed using the standard protocol during bolus administration of intravenous contrast.  CONTRAST:  61mL OMNIPAQUE IOHEXOL 350 MG/ML SOLN  COMPARISON:  Abdominal CT 10/28/2011.  FINDINGS: CTA  CHEST FINDINGS  THORACIC INLET/BODY WALL:  No acute abnormality.  MEDIASTINUM:  Normal heart size. No pericardial effusion. Diffuse atherosclerosis, including the coronary arteries. No acute vascular abnormality, including pulmonary embolism or aortic dissection. No adenopathy.  LUNG WINDOWS:  No consolidation. Suspect mild centrilobular emphysema at the apices. No consolidation. Tiny subpleural nodular densities, especially the right upper lung appear most like scarring.  OSSEOUS:  No acute fracture.  No suspicious lytic or blastic lesions.  CT ABDOMEN and PELVIS FINDINGS  BODY WALL: Unremarkable.  ABDOMEN/PELVIS:  Liver:  Extensive fatty infiltration.  Biliary: Distended gallbladder, without visible stones or wall thickening. Chronic biliary ductal dilatation to 1 cm. No duct stone identified.  Pancreas: There is new diffuse parenchymal enlargement and peripancreatic fat infiltration. No fluid collection, necrosis, ductal dilatation, or vascular complication.  Spleen: Unremarkable.  Adrenals: Unremarkable.  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 This is reportedly a remote cryoablation site. No new enhancement to suggest tumor recurrence.  Bladder: Unremarkable.  Reproductive: Hysterectomy.  Bowel: No obstruction. No pericecal inflammation.  Retroperitoneum: No mass or adenopathy.  Peritoneum: No free fluid or gas.  Vascular: No acute abnormality.  OSSEOUS: ORIF of the proximal left femur. Imaged portions of the hardware are unremarkable.  Review of the MIP images confirms the above findings.  IMPRESSION: 1. Pancreatitis. No necrosis, collection, or other complicating feature. 2. Negative for pulmonary embolism or other acute intrathoracic abnormality.   Electronically Signed   By: Jorje Guild M.D.   On: 02/09/2013 04:08   Ct Abdomen Pelvis W Contrast  02/09/2013   CLINICAL DATA:  Abdominal pain and pancreatitis. Shortness of breath and cough.  EXAM: CT ANGIOGRAPHY CHEST  CT ABDOMEN AND PELVIS WITH CONTRAST  TECHNIQUE: Multidetector CT imaging of the chest was performed using the standard protocol during bolus administration of intravenous contrast. Multiplanar CT image reconstructions including MIPs were obtained to evaluate the vascular anatomy. Multidetector CT imaging of the abdomen and pelvis was performed using the standard protocol during bolus administration of intravenous contrast.  CONTRAST:  47mL OMNIPAQUE IOHEXOL 350 MG/ML SOLN  COMPARISON:  Abdominal CT 10/28/2011.  FINDINGS: CTA CHEST FINDINGS  THORACIC INLET/BODY WALL:  No acute  abnormality.  MEDIASTINUM:  Normal heart size. No pericardial effusion. Diffuse atherosclerosis, including the coronary arteries. No acute vascular abnormality, including pulmonary embolism or aortic dissection. No adenopathy.  LUNG WINDOWS:  No consolidation. Suspect mild centrilobular emphysema at the apices. No consolidation. Tiny subpleural nodular densities, especially the right upper lung appear most like scarring.  OSSEOUS:  No acute fracture.  No suspicious lytic or blastic lesions.  CT ABDOMEN and PELVIS FINDINGS  BODY WALL: Unremarkable.  ABDOMEN/PELVIS:  Liver: Extensive fatty infiltration.  Biliary: Distended gallbladder, without visible stones or wall thickening. Chronic biliary ductal dilatation to 1 cm. No duct stone identified.  Pancreas: There is new diffuse parenchymal enlargement and peripancreatic fat infiltration. No fluid collection, necrosis, ductal dilatation, or vascular complication.  Spleen: Unremarkable.  Adrenals: Unremarkable.  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 This is reportedly a remote cryoablation site. No new enhancement to suggest tumor recurrence.  Bladder: Unremarkable.  Reproductive: Hysterectomy.  Bowel: No obstruction. No pericecal inflammation.  Retroperitoneum: No mass or adenopathy.  Peritoneum: No free fluid or gas.  Vascular: No acute abnormality.  OSSEOUS: ORIF of the proximal left femur. Imaged portions of the  hardware are unremarkable.  Review of the MIP images confirms the above findings.  IMPRESSION: 1. Pancreatitis. No necrosis, collection, or other complicating feature. 2. Negative for pulmonary embolism or other acute intrathoracic abnormality.   Electronically Signed   By: Jorje Guild M.D.   On: 02/09/2013 04:08   Dg Chest Port 1 View  02/09/2013   CLINICAL DATA:  Shortness of breath. Upper right. Rule out free air.  EXAM: PORTABLE CHEST - 1 VIEW  COMPARISON:  06/07/2011   FINDINGS: Hyperinflated lungs. No edema or consolidation. No effusion or pneumothorax. Normal heart size. No evidence of pneumoperitoneum in the upper abdomen.  IMPRESSION: 1. Stable exam.   No active disease. 2. No evidence of free peritoneal air.   Electronically Signed   By: Jorje Guild M.D.   On: 02/09/2013 01:26    Microbiology: No results found for this or any previous visit (from the past 240 hour(s)).   Labs: Basic Metabolic Panel:  Recent Labs Lab 02/09/13 0120 02/10/13 0430  NA 136* 137  K 3.1* 3.8  CL 96 105  CO2 22 20  GLUCOSE 116* 61*  BUN 7 10  CREATININE 0.63 0.56  CALCIUM 8.9 7.7*   Liver Function Tests:  Recent Labs Lab 02/09/13 0120  AST 119*  ALT 39*  ALKPHOS 161*  BILITOT 0.8  PROT 7.1  ALBUMIN 3.4*    Recent Labs Lab 02/09/13 0120 02/10/13 0430 02/11/13 0413  LIPASE 1803* 188* 75*   No results found for this basename: AMMONIA,  in the last 168 hours CBC:  Recent Labs Lab 02/09/13 0120  WBC 18.8*  NEUTROABS 16.3*  HGB 13.9  HCT 39.2  MCV 89.9  PLT 179   Cardiac Enzymes:  Recent Labs Lab 02/09/13 0120  TROPONINI <0.30   BNP: BNP (last 3 results) No results found for this basename: PROBNP,  in the last 8760 hours CBG: No results found for this basename: GLUCAP,  in the last 168 hours   Signed:  CHIU, STEPHEN K  [facility] 02/11/2013, 3:22 PM

## 2013-02-14 IMAGING — CR DG RIBS W/ CHEST 3+V*L*
3 series · 3 of 3 positions shown · non-contrast
Comparison: 06/13/2010

CLINICAL DATA: Left rib pain secondary to a fall.

LEFT RIBS AND CHEST - 3+ VIEW

[w chest pa]
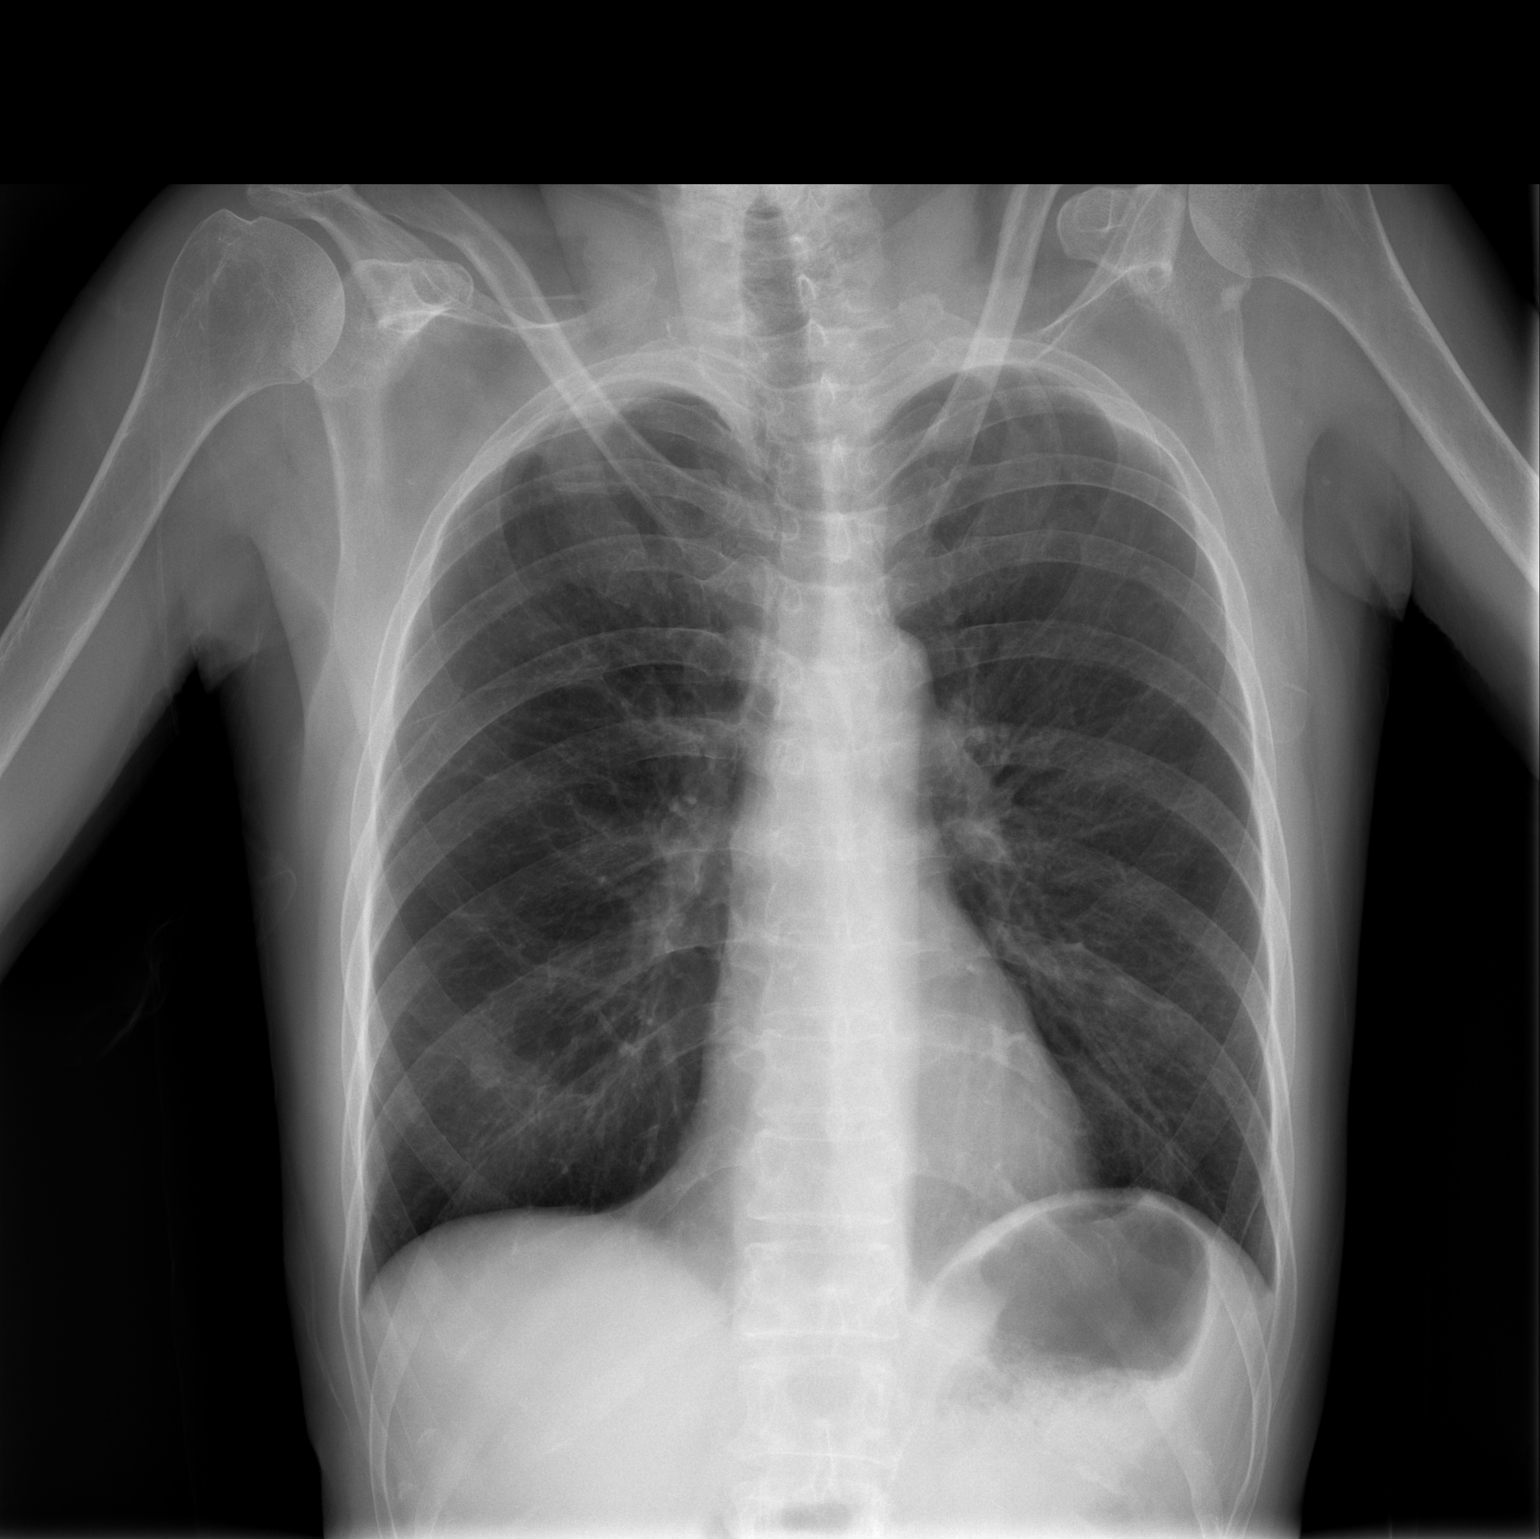

[w ribs ap/pa lower left]
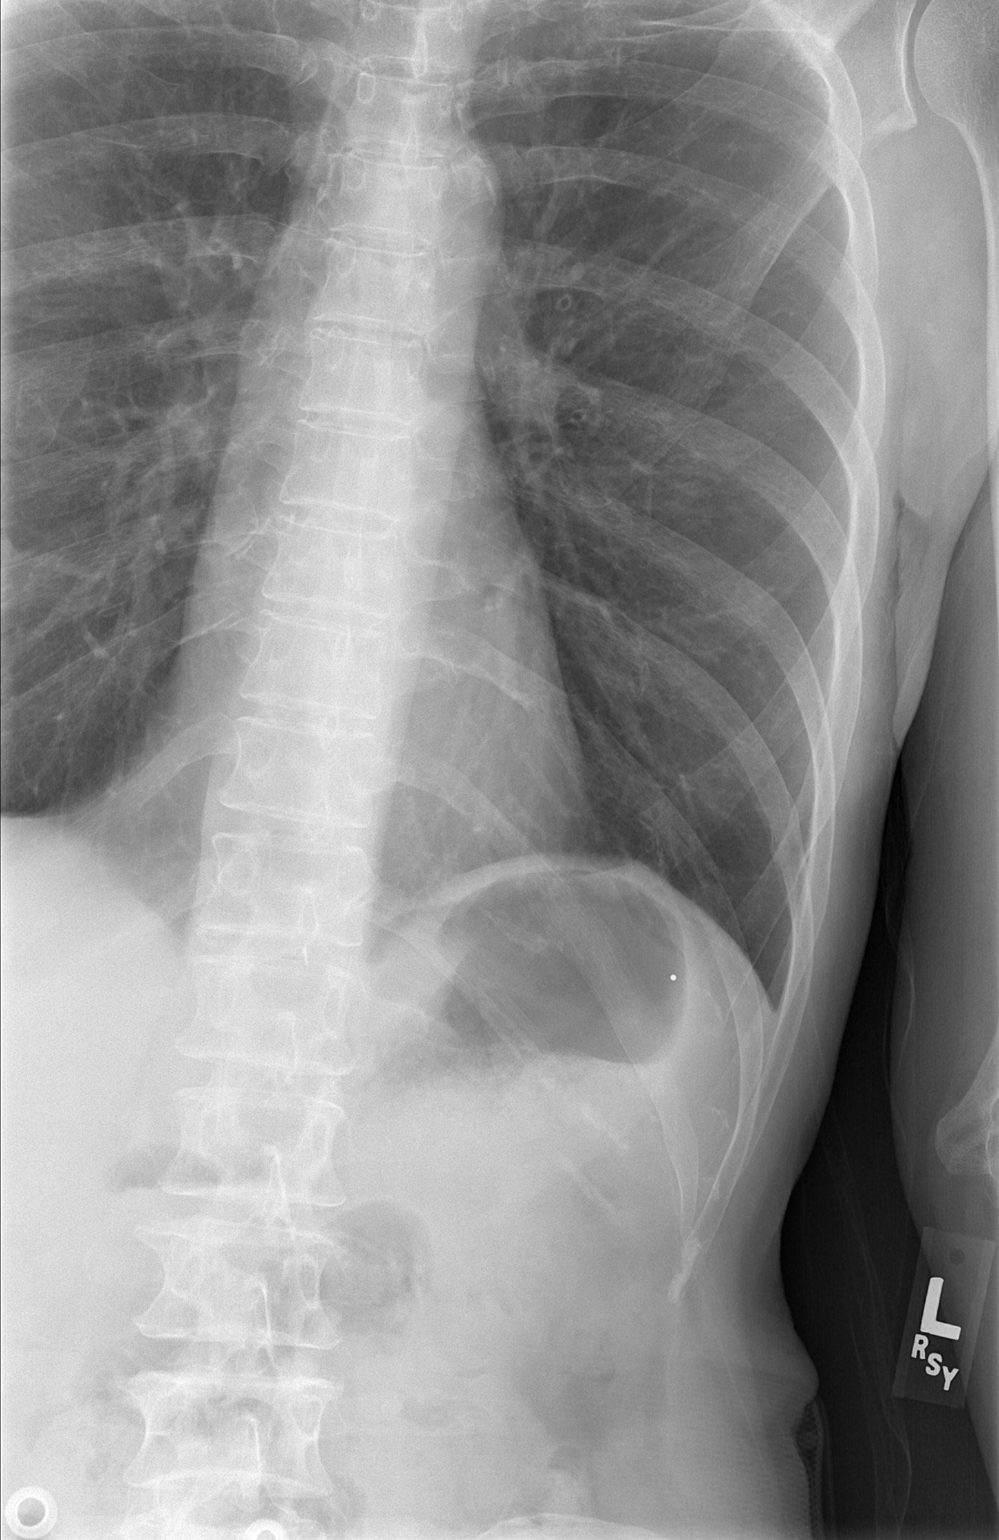

[w ribs oblique left]
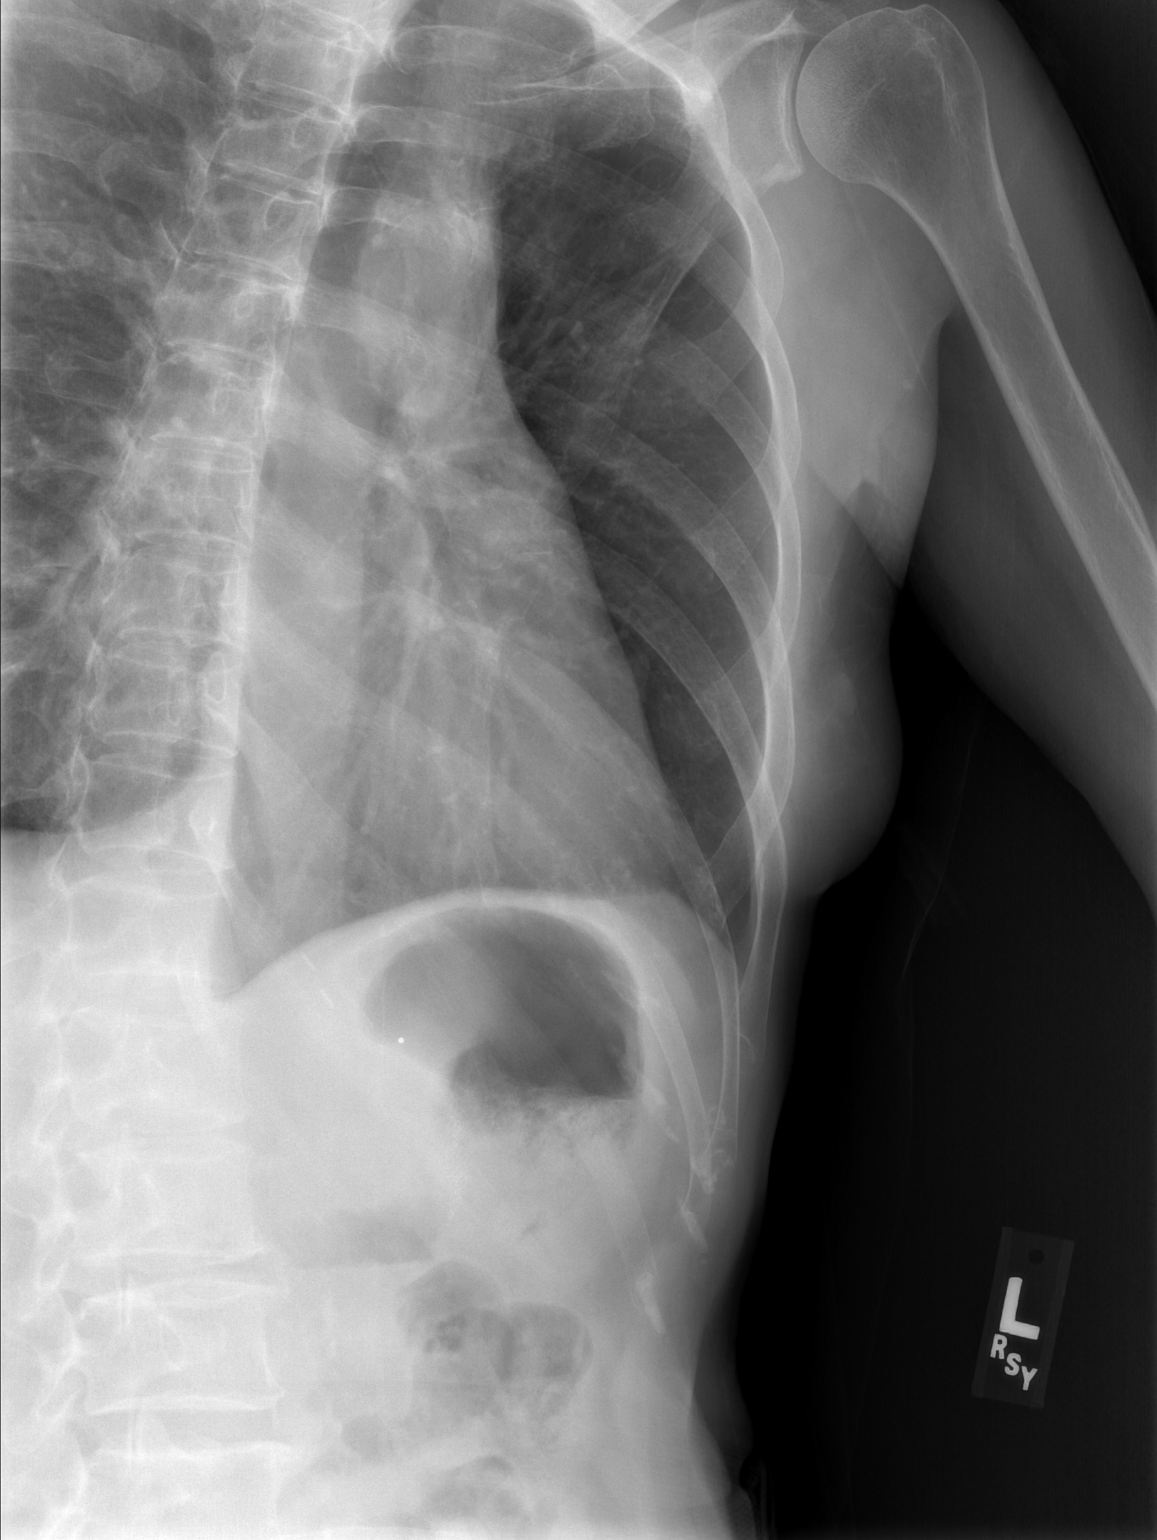

[3 of 3 positions shown; findings below may reference images not displayed]

FINDINGS: There are fractures of the posterior lateral aspects of
the left ninth and tenth ribs.  No pneumothorax or lung contusion
or pleural effusion.

Heart size and vascularity are normal.
IMPRESSION: Fractures of the posterior lateral aspects of the left ninth and
tenth ribs.

## 2013-02-17 IMAGING — CR DG PELVIS 1-2V
1 series · 1 of 1 positions shown · non-contrast
Comparison: None.

CLINICAL DATA: Fall.  Pelvic pain.

PELVIS - 1-2 VIEW

[view not recorded]
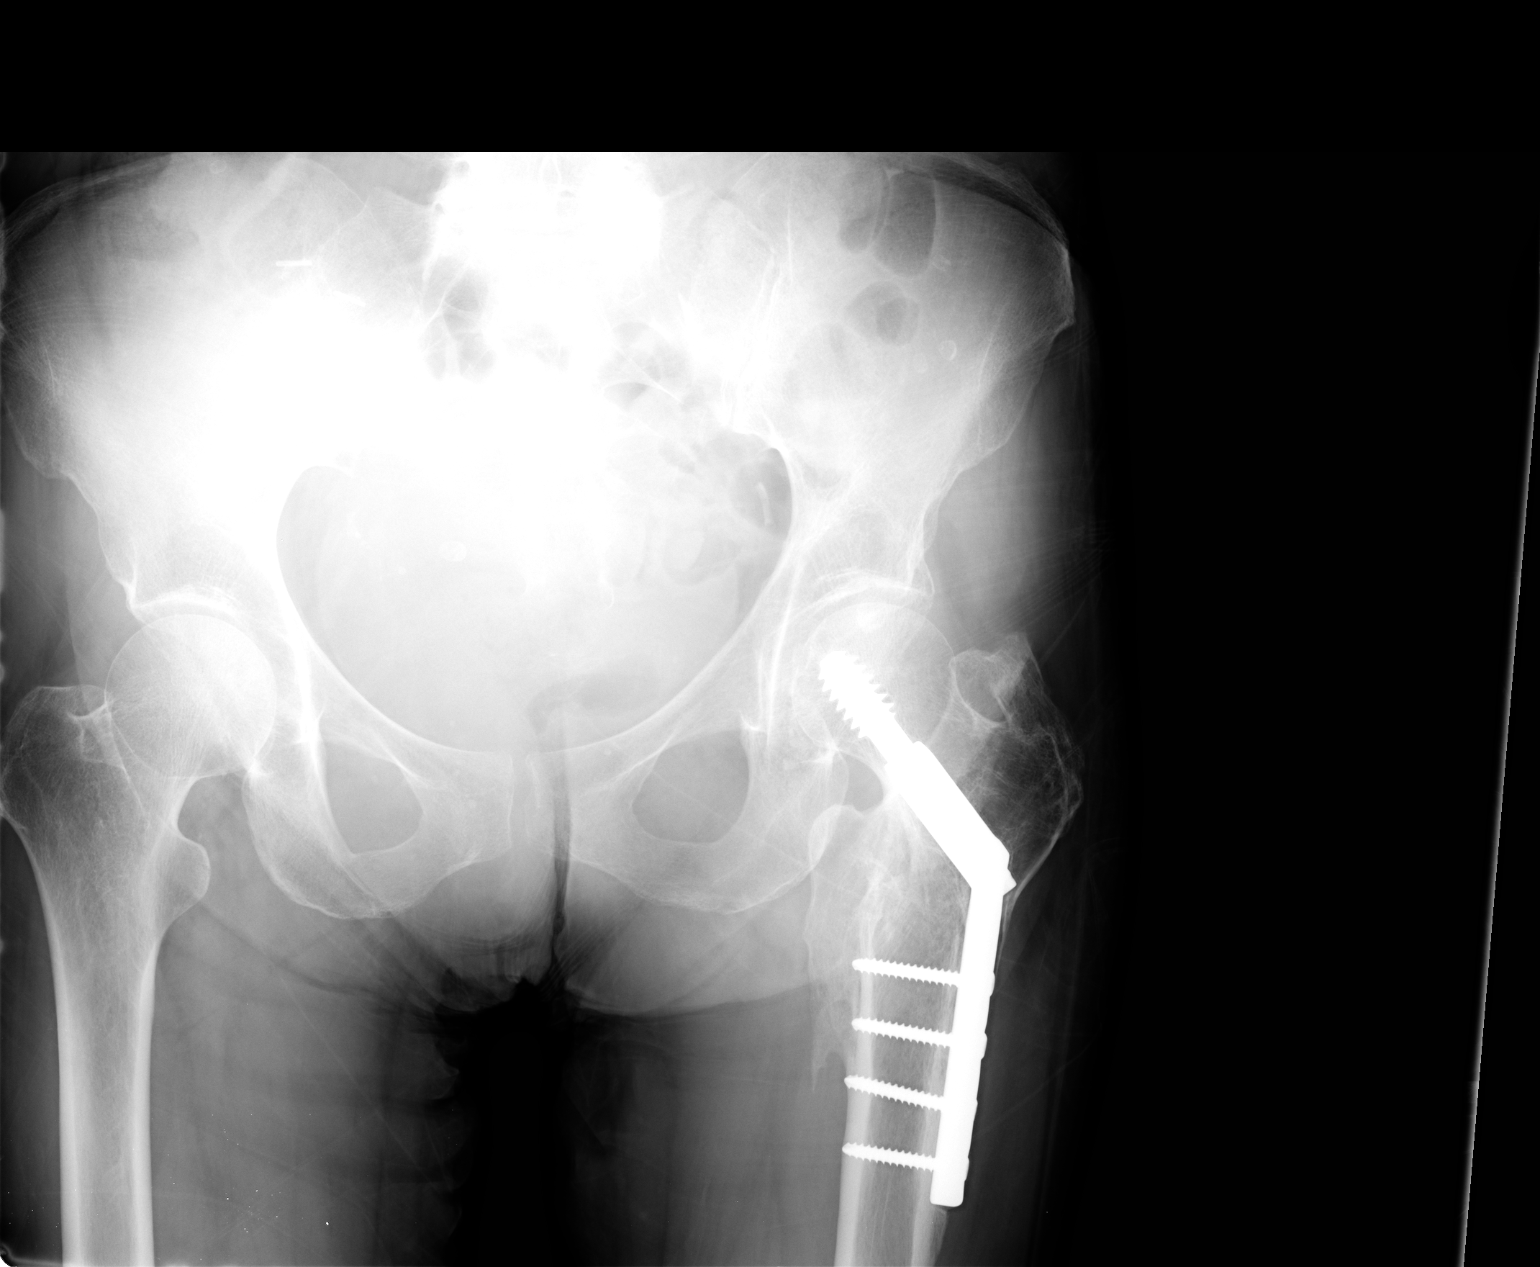

[1 of 1 positions shown; findings below may reference images not displayed]

FINDINGS: No evidence of pelvic fracture or pelvic joint diastasis.
Old left hip fracture deformity is seen with compression screw -
plate fixation device.  No other significant abnormality
identified.
IMPRESSION: No acute findings.

## 2013-03-09 ENCOUNTER — Emergency Department (HOSPITAL_COMMUNITY): Payer: Medicaid Other

## 2013-03-09 ENCOUNTER — Encounter (HOSPITAL_COMMUNITY): Payer: Self-pay | Admitting: Emergency Medicine

## 2013-03-09 ENCOUNTER — Inpatient Hospital Stay (HOSPITAL_COMMUNITY)
Admission: EM | Admit: 2013-03-09 | Discharge: 2013-03-11 | DRG: 438 | Disposition: A | Discharge status: Home / Self Care | Payer: Medicaid Other | Attending: Internal Medicine | Admitting: Internal Medicine

## 2013-03-09 DIAGNOSIS — Z720 Tobacco use: Secondary | ICD-10-CM | POA: Diagnosis present

## 2013-03-09 DIAGNOSIS — F1020 Alcohol dependence, uncomplicated: Secondary | ICD-10-CM | POA: Diagnosis present

## 2013-03-09 DIAGNOSIS — D6959 Other secondary thrombocytopenia: Secondary | ICD-10-CM | POA: Diagnosis present

## 2013-03-09 DIAGNOSIS — Z888 Allergy status to other drugs, medicaments and biological substances status: Secondary | ICD-10-CM

## 2013-03-09 DIAGNOSIS — F10232 Alcohol dependence with withdrawal with perceptual disturbance: Secondary | ICD-10-CM | POA: Diagnosis present

## 2013-03-09 DIAGNOSIS — K861 Other chronic pancreatitis: Secondary | ICD-10-CM | POA: Diagnosis present

## 2013-03-09 DIAGNOSIS — F10939 Alcohol use, unspecified with withdrawal, unspecified: Secondary | ICD-10-CM

## 2013-03-09 DIAGNOSIS — F10932 Alcohol use, unspecified with withdrawal with perceptual disturbance: Secondary | ICD-10-CM | POA: Diagnosis present

## 2013-03-09 DIAGNOSIS — Z8249 Family history of ischemic heart disease and other diseases of the circulatory system: Secondary | ICD-10-CM

## 2013-03-09 DIAGNOSIS — R569 Unspecified convulsions: Secondary | ICD-10-CM | POA: Diagnosis present

## 2013-03-09 DIAGNOSIS — F17200 Nicotine dependence, unspecified, uncomplicated: Secondary | ICD-10-CM | POA: Diagnosis present

## 2013-03-09 DIAGNOSIS — J449 Chronic obstructive pulmonary disease, unspecified: Secondary | ICD-10-CM | POA: Diagnosis present

## 2013-03-09 DIAGNOSIS — Z85528 Personal history of other malignant neoplasm of kidney: Secondary | ICD-10-CM

## 2013-03-09 DIAGNOSIS — E43 Unspecified severe protein-calorie malnutrition: Secondary | ICD-10-CM | POA: Diagnosis present

## 2013-03-09 DIAGNOSIS — I10 Essential (primary) hypertension: Secondary | ICD-10-CM | POA: Diagnosis present

## 2013-03-09 DIAGNOSIS — J4489 Other specified chronic obstructive pulmonary disease: Secondary | ICD-10-CM | POA: Diagnosis present

## 2013-03-09 DIAGNOSIS — R011 Cardiac murmur, unspecified: Secondary | ICD-10-CM | POA: Diagnosis present

## 2013-03-09 DIAGNOSIS — F1910 Other psychoactive substance abuse, uncomplicated: Secondary | ICD-10-CM | POA: Diagnosis present

## 2013-03-09 DIAGNOSIS — F1010 Alcohol abuse, uncomplicated: Secondary | ICD-10-CM

## 2013-03-09 DIAGNOSIS — K852 Alcohol induced acute pancreatitis without necrosis or infection: Secondary | ICD-10-CM

## 2013-03-09 DIAGNOSIS — K859 Acute pancreatitis without necrosis or infection, unspecified: Principal | ICD-10-CM | POA: Diagnosis present

## 2013-03-09 DIAGNOSIS — R109 Unspecified abdominal pain: Secondary | ICD-10-CM | POA: Diagnosis present

## 2013-03-09 DIAGNOSIS — Z681 Body mass index (BMI) 19 or less, adult: Secondary | ICD-10-CM

## 2013-03-09 DIAGNOSIS — F10239 Alcohol dependence with withdrawal, unspecified: Secondary | ICD-10-CM | POA: Diagnosis present

## 2013-03-09 DIAGNOSIS — G8929 Other chronic pain: Secondary | ICD-10-CM

## 2013-03-09 DIAGNOSIS — Z903 Acquired absence of stomach [part of]: Secondary | ICD-10-CM

## 2013-03-09 DIAGNOSIS — K7460 Unspecified cirrhosis of liver: Secondary | ICD-10-CM | POA: Diagnosis present

## 2013-03-09 DIAGNOSIS — D696 Thrombocytopenia, unspecified: Secondary | ICD-10-CM

## 2013-03-09 DIAGNOSIS — K5900 Constipation, unspecified: Secondary | ICD-10-CM | POA: Diagnosis present

## 2013-03-09 LAB — URINALYSIS, ROUTINE W REFLEX MICROSCOPIC
Bilirubin Urine: NEGATIVE
Glucose, UA: NEGATIVE mg/dL
KETONES UR: NEGATIVE mg/dL
Nitrite: NEGATIVE
PROTEIN: NEGATIVE mg/dL
Specific Gravity, Urine: 1.001 — ABNORMAL LOW (ref 1.005–1.030)
UROBILINOGEN UA: 0.2 mg/dL (ref 0.0–1.0)
pH: 6.5 (ref 5.0–8.0)

## 2013-03-09 LAB — COMPREHENSIVE METABOLIC PANEL
ALT: 24 U/L (ref 0–35)
AST: 44 U/L — ABNORMAL HIGH (ref 0–37)
Albumin: 3.5 g/dL (ref 3.5–5.2)
Alkaline Phosphatase: 153 U/L — ABNORMAL HIGH (ref 39–117)
BUN: 6 mg/dL (ref 6–23)
CALCIUM: 9.5 mg/dL (ref 8.4–10.5)
CO2: 23 mEq/L (ref 19–32)
Chloride: 94 mEq/L — ABNORMAL LOW (ref 96–112)
Creatinine, Ser: 0.73 mg/dL (ref 0.50–1.10)
GFR calc non Af Amer: >90 mL/min (ref >90)
GLUCOSE: 79 mg/dL (ref 70–99)
Potassium: 3.8 mEq/L (ref 3.7–5.3)
Sodium: 136 mEq/L — ABNORMAL LOW (ref 137–147)
TOTAL PROTEIN: 7.3 g/dL (ref 6.0–8.3)
Total Bilirubin: 0.4 mg/dL (ref 0.3–1.2)

## 2013-03-09 LAB — CBC WITH DIFFERENTIAL/PLATELET
BASOS PCT: 0 % (ref 0–1)
Basophils Absolute: 0 10*3/uL (ref 0.0–0.1)
EOS ABS: 0.1 10*3/uL (ref 0.0–0.7)
EOS PCT: 1 % (ref 0–5)
HCT: 44.8 % (ref 36.0–46.0)
Hemoglobin: 15.3 g/dL — ABNORMAL HIGH (ref 12.0–15.0)
LYMPHS ABS: 1.9 10*3/uL (ref 0.7–4.0)
Lymphocytes Relative: 26 % (ref 12–46)
MCH: 31 pg (ref 26.0–34.0)
MCHC: 34.2 g/dL (ref 30.0–36.0)
MCV: 90.7 fL (ref 78.0–100.0)
MONOS PCT: 11 % (ref 3–12)
Monocytes Absolute: 0.8 10*3/uL (ref 0.1–1.0)
Neutro Abs: 4.6 10*3/uL (ref 1.7–7.7)
Neutrophils Relative %: 62 % (ref 43–77)
PLATELETS: 140 10*3/uL — AB (ref 150–400)
RBC: 4.94 MIL/uL (ref 3.87–5.11)
RDW: 13.7 % (ref 11.5–15.5)
WBC: 7.3 10*3/uL (ref 4.0–10.5)

## 2013-03-09 LAB — I-STAT TROPONIN, ED: TROPONIN I, POC: 0.02 ng/mL (ref 0.00–0.08)

## 2013-03-09 LAB — PROTIME-INR
INR: 0.93 (ref 0.00–1.49)
Prothrombin Time: 12.3 seconds (ref 11.6–15.2)

## 2013-03-09 LAB — URINE MICROSCOPIC-ADD ON

## 2013-03-09 LAB — ETHANOL: Alcohol, Ethyl (B): 153 mg/dL — ABNORMAL HIGH (ref 0–11)

## 2013-03-09 LAB — LIPASE, BLOOD: Lipase: 215 U/L — ABNORMAL HIGH (ref 11–59)

## 2013-03-09 MED ORDER — LORAZEPAM 2 MG/ML IJ SOLN
1.0000 mg | Freq: Four times a day (QID) | INTRAMUSCULAR | Status: DC | PRN
  Administered 2013-03-10 (×2): 1 mg via INTRAVENOUS
  Filled 2013-03-09 (×3): qty 1

## 2013-03-09 MED ORDER — ONDANSETRON HCL 4 MG PO TABS
4.0000 mg | ORAL_TABLET | Freq: Four times a day (QID) | ORAL | Status: DC | PRN
  Administered 2013-03-10: 4 mg via ORAL
  Filled 2013-03-09: qty 1

## 2013-03-09 MED ORDER — LORAZEPAM 1 MG PO TABS: 1.0000 mg | ORAL_TABLET | Freq: Four times a day (QID) | ORAL | Status: DC | PRN

## 2013-03-09 MED ORDER — NICOTINE 21 MG/24HR TD PT24
21.0000 mg | MEDICATED_PATCH | Freq: Every day | TRANSDERMAL | Status: DC
  Administered 2013-03-10 – 2013-03-11 (×2): 21 mg via TRANSDERMAL
  Filled 2013-03-09 (×3): qty 1

## 2013-03-09 MED ORDER — HYDROMORPHONE HCL PF 1 MG/ML IJ SOLN
1.0000 mg | Freq: Once | INTRAMUSCULAR | Status: AC
  Administered 2013-03-09: 1 mg via INTRAVENOUS
  Filled 2013-03-09: qty 1

## 2013-03-09 MED ORDER — ACETAMINOPHEN 325 MG PO TABS: 650.0000 mg | ORAL_TABLET | Freq: Four times a day (QID) | ORAL | Status: DC | PRN

## 2013-03-09 MED ORDER — LORAZEPAM 2 MG/ML IJ SOLN: 0.0000 mg | Freq: Two times a day (BID) | INTRAMUSCULAR | Status: DC

## 2013-03-09 MED ORDER — FLUTICASONE PROPIONATE HFA 110 MCG/ACT IN AERO
2.0000 | INHALATION_SPRAY | Freq: Two times a day (BID) | RESPIRATORY_TRACT | Status: DC
  Administered 2013-03-10 – 2013-03-11 (×3): 2 via RESPIRATORY_TRACT
  Filled 2013-03-09: qty 12

## 2013-03-09 MED ORDER — ONDANSETRON HCL 4 MG PO TABS: 8.0000 mg | ORAL_TABLET | Freq: Three times a day (TID) | ORAL | Status: DC | PRN

## 2013-03-09 MED ORDER — GUAIFENESIN ER 600 MG PO TB12
600.0000 mg | ORAL_TABLET | Freq: Two times a day (BID) | ORAL | Status: DC
  Administered 2013-03-09 – 2013-03-11 (×4): 600 mg via ORAL
  Filled 2013-03-09 (×6): qty 1

## 2013-03-09 MED ORDER — FOLIC ACID 1 MG PO TABS
1.0000 mg | ORAL_TABLET | Freq: Every day | ORAL | Status: DC
  Administered 2013-03-10 – 2013-03-11 (×2): 1 mg via ORAL
  Filled 2013-03-09 (×3): qty 1

## 2013-03-09 MED ORDER — HYDROCODONE-ACETAMINOPHEN 5-325 MG PO TABS
1.0000 | ORAL_TABLET | ORAL | Status: DC | PRN
  Administered 2013-03-09 – 2013-03-11 (×7): 2 via ORAL
  Filled 2013-03-09 (×7): qty 2

## 2013-03-09 MED ORDER — ALBUTEROL SULFATE (2.5 MG/3ML) 0.083% IN NEBU: 2.5000 mg | INHALATION_SOLUTION | RESPIRATORY_TRACT | Status: DC | PRN

## 2013-03-09 MED ORDER — ONDANSETRON HCL 4 MG/2ML IJ SOLN
4.0000 mg | Freq: Once | INTRAMUSCULAR | Status: AC
  Administered 2013-03-09: 4 mg via INTRAVENOUS
  Filled 2013-03-09: qty 2

## 2013-03-09 MED ORDER — THIAMINE HCL 100 MG/ML IJ SOLN
100.0000 mg | Freq: Every day | INTRAMUSCULAR | Status: DC
  Administered 2013-03-10 – 2013-03-11 (×2): 100 mg via INTRAVENOUS
  Filled 2013-03-09 (×2): qty 1

## 2013-03-09 MED ORDER — LORAZEPAM 1 MG PO TABS
1.0000 mg | ORAL_TABLET | Freq: Four times a day (QID) | ORAL | Status: DC | PRN
Start: 2013-03-09 — End: 2013-03-11
  Administered 2013-03-10 – 2013-03-11 (×3): 1 mg via ORAL
  Filled 2013-03-09 (×4): qty 1

## 2013-03-09 MED ORDER — PANTOPRAZOLE SODIUM 40 MG PO TBEC
40.0000 mg | DELAYED_RELEASE_TABLET | Freq: Every day | ORAL | Status: DC
  Administered 2013-03-10 – 2013-03-11 (×2): 40 mg via ORAL
  Filled 2013-03-09 (×3): qty 1

## 2013-03-09 MED ORDER — IPRATROPIUM-ALBUTEROL 0.5-2.5 (3) MG/3ML IN SOLN
3.0000 mL | Freq: Three times a day (TID) | RESPIRATORY_TRACT | Status: DC
  Administered 2013-03-10 – 2013-03-11 (×5): 3 mL via RESPIRATORY_TRACT
  Filled 2013-03-09 (×5): qty 3

## 2013-03-09 MED ORDER — IPRATROPIUM BROMIDE 0.02 % IN SOLN: 0.5000 mg | Freq: Four times a day (QID) | RESPIRATORY_TRACT | Status: DC | PRN

## 2013-03-09 MED ORDER — IPRATROPIUM BROMIDE 0.02 % IN SOLN
0.5000 mg | Freq: Four times a day (QID) | RESPIRATORY_TRACT | Status: DC
  Administered 2013-03-09: 0.5 mg via RESPIRATORY_TRACT
  Filled 2013-03-09: qty 2.5

## 2013-03-09 MED ORDER — VITAMIN B-1 100 MG PO TABS
100.0000 mg | ORAL_TABLET | Freq: Every day | ORAL | Status: DC
  Filled 2013-03-09 (×2): qty 1

## 2013-03-09 MED ORDER — SODIUM CHLORIDE 0.9 % IV SOLN
INTRAVENOUS | Status: DC
  Administered 2013-03-09 – 2013-03-10 (×4): via INTRAVENOUS

## 2013-03-09 MED ORDER — ACETAMINOPHEN 650 MG RE SUPP: 650.0000 mg | Freq: Four times a day (QID) | RECTAL | Status: DC | PRN

## 2013-03-09 MED ORDER — SODIUM CHLORIDE 0.9 % IV BOLUS (SEPSIS)
2000.0000 mL | Freq: Once | INTRAVENOUS | Status: AC
  Administered 2013-03-09: 2000 mL via INTRAVENOUS

## 2013-03-09 MED ORDER — PANCRELIPASE (LIP-PROT-AMYL) 12000-38000 UNITS PO CPEP
1.0000 | ORAL_CAPSULE | Freq: Three times a day (TID) | ORAL | Status: DC
  Administered 2013-03-10 – 2013-03-11 (×4): 1 via ORAL
  Filled 2013-03-09 (×7): qty 1

## 2013-03-09 MED ORDER — HYDROMORPHONE HCL 4 MG PO TABS
4.0000 mg | ORAL_TABLET | ORAL | Status: DC | PRN
  Administered 2013-03-10 – 2013-03-11 (×10): 4 mg via ORAL
  Filled 2013-03-09 (×10): qty 2

## 2013-03-09 MED ORDER — ONDANSETRON HCL 4 MG/2ML IJ SOLN
4.0000 mg | Freq: Four times a day (QID) | INTRAMUSCULAR | Status: DC | PRN
Start: 2013-03-09 — End: 2013-03-11
  Administered 2013-03-09 – 2013-03-11 (×4): 4 mg via INTRAVENOUS
  Filled 2013-03-09 (×4): qty 2

## 2013-03-09 MED ORDER — ADULT MULTIVITAMIN W/MINERALS CH
1.0000 | ORAL_TABLET | Freq: Every day | ORAL | Status: DC
  Administered 2013-03-10 – 2013-03-11 (×2): 1 via ORAL
  Filled 2013-03-09 (×3): qty 1

## 2013-03-09 MED ORDER — LORAZEPAM 2 MG/ML IJ SOLN
0.0000 mg | Freq: Four times a day (QID) | INTRAMUSCULAR | Status: DC
  Administered 2013-03-09 – 2013-03-10 (×2): 2 mg via INTRAVENOUS
  Administered 2013-03-10 – 2013-03-11 (×5): 1 mg via INTRAVENOUS
  Filled 2013-03-09 (×6): qty 1

## 2013-03-09 MED ORDER — ALBUTEROL SULFATE (2.5 MG/3ML) 0.083% IN NEBU: 2.5000 mg | INHALATION_SOLUTION | Freq: Four times a day (QID) | RESPIRATORY_TRACT | Status: DC | PRN

## 2013-03-09 NOTE — H&P (Signed)
PCP:  Ricke Hey, MD    Chief Complaint:  Abdominal pain  HPI: [name] is a 63 y.o. female   has a past medical history of Pancreatitis; COPD (chronic obstructive pulmonary disease); Substance abuse; Cancer; Seizures; and Pancreatitis.   Presented with  Patient was recently admitted with exacerbation of chronic pancreatitis and CAP.  She states her pain never improved. She continued to take her regular medications and drinking alcohol hoping to help the pain. She endorses epigastric pain. States she presented to ER when pain was intolerable. Endorsed nausea and vomiting poor PO intake. States have not eaten  anything in past 2 weeks. Currently drinking from the cup.  Denies any fever or chills. She still has some cough but that has improved. Continues to smoke. Reports drinking 2 beers a day 24 oz each. Last drink was this AM. She feels anxious now and tremulous, feels like her skin is crawling.  Hospitalist called for admission.  Review of Systems:     Pertinent positives include: abdominal pain, nausea, vomiting, non-productive cough,  Constitutional:  No weight loss, night sweats, Fevers, chills, fatigue, weight loss  HEENT:  No headaches, Difficulty swallowing,Tooth/dental problems,Sore throat,  No sneezing, itching, ear ache, nasal congestion, post nasal drip,  Cardio-vascular:  No chest pain, Orthopnea, PND, anasarca, dizziness, palpitations.no Bilateral lower extremity swelling  GI:  No heartburn, indigestion, iarrhea, change in bowel habits, loss of appetite, melena, blood in stool, hematemesis Resp:  no shortness of breath at rest. No dyspnea on exertion, No excess mucus, no productive cough, No  No coughing up of blood.No change in color of mucus.No wheezing. Skin:  no rash or lesions. No jaundice GU:  no dysuria, change in color of urine, no urgency or frequency. No straining to urinate.  No flank pain.  Musculoskeletal:  No joint pain or no joint  swelling. No decreased range of motion. No back pain.  Psych:  No change in mood or affect. No depression or anxiety. No memory loss.  Neuro: no localizing neurological complaints, no tingling, no weakness, no double vision, no gait abnormality, no slurred speech, no confusion  Otherwise ROS are negative except for above, 10 systems were reviewed  Past Medical History: Past Medical History  Diagnosis Date  . Pancreatitis   . COPD (chronic obstructive pulmonary disease)   . Substance abuse   . Cancer     renal ca  . Seizures   . Pancreatitis    Past Surgical History  Procedure Laterality Date  . Partial gastrectomy       Medications: Prior to Admission medications   Medication Sig Start Date End Date Taking? Authorizing Provider  fluticasone (FLOVENT HFA) 110 MCG/ACT inhaler Inhale 2 puffs into the lungs 2 (two) times daily.   Yes Historical Provider, MD  HYDROmorphone (DILAUDID) 4 MG tablet Take 4 mg by mouth every 4 (four) hours as needed for severe pain.   Yes Historical Provider, MD  LORazepam (ATIVAN) 1 MG tablet Take 1 tablet (1 mg total) by mouth every 6 (six) hours as needed. For anxiety. 04/07/11  Yes Joaquim Lai C. Sanford, PA-C  ondansetron (ZOFRAN) 8 MG tablet Take 8 mg by mouth every 8 (eight) hours as needed for nausea or vomiting.   Yes Historical Provider, MD    Allergies:   Allergies  Allergen Reactions  . Aspirin Other (See Comments)    bleeding  . Chlorpromazine Hcl Other (See Comments)    Muscle spasms    Social History:  Ambulatory  Independently  Lives at   Home with husband   reports that she has been smoking Cigarettes.  She has been smoking about 0.50 packs per day. She has never used smokeless tobacco. She reports that she drinks alcohol. She reports that she uses illicit drugs.   Family History: family history includes Alcoholism in her father; CAD in her mother.    Physical Exam: Patient Vitals for the past 24 hrs:  BP Temp Temp src Pulse  Resp SpO2  03/09/13 1918 141/91 mmHg - - 88 14 98 %  03/09/13 1816 139/69 mmHg - - 99 - -  03/09/13 1650 139/69 mmHg - - 99 17 99 %  03/09/13 1644 - 97.7 F (36.5 C) Oral - - -  03/09/13 1634 - - - - - 99 %    1. General:  in No Acute distress 2. Psychological: Alert and  Oriented 3. Head/ENT:    Dry Mucous Membranes                          Head Non traumatic, neck supple                          Normal  Dentition 4. SKIN:   decreased Skin turgor,  Skin clean Dry and intact no rash 5. Heart: Regular rate and rhythm systolic Murmur present, no Rub or gallop 6. Lungs: no wheezes or crackles, somewhat diminished air movement   7. Abdomen: Soft, some epigastric tenderness but easily distractable, Non distended, hepatomegaly 8. Lower extremities: no clubbing, cyanosis, or edema 9. Neurologically Grossly intact, moving all 4 extremities equally, tremulous 10. MSK: Normal range of motion  body mass index is unknown because there is no weight on file.   Labs on Admission:   Recent Labs  03/09/13 1821  NA 136*  K 3.8  CL 94*  CO2 23  GLUCOSE 79  BUN 6  CREATININE 0.73  CALCIUM 9.5    Recent Labs  03/09/13 1821  AST 44*  ALT 24  ALKPHOS 153*  BILITOT 0.4  PROT 7.3  ALBUMIN 3.5    Recent Labs  03/09/13 1821  LIPASE 215*    Recent Labs  03/09/13 1821  WBC 7.3  NEUTROABS 4.6  HGB 15.3*  HCT 44.8  MCV 90.7  PLT 140*   No results found for this basename: CKTOTAL, CKMB, CKMBINDEX, TROPONINI,  in the last 72 hours No results found for this basename: TSH, T4TOTAL, FREET3, T3FREE, THYROIDAB,  in the last 72 hours No results found for this basename: VITAMINB12, FOLATE, FERRITIN, TIBC, IRON, RETICCTPCT,  in the last 72 hours Lab Results  Component Value Date   HGBA1C  Value: 5.4 (NOTE)   The ADA recommends the following therapeutic goal for glycemic   control related to Hgb A1C measurement:   Goal of Therapy:   < 7.0% Hgb A1C   Reference: American Diabetes  Association: Clinical Practice   Recommendations 2008, Diabetes Care,  2008, 31:(Suppl 1). 03/21/2008    The CrCl is unknown because both a height and weight (above a minimum accepted value) are required for this calculation.    Other results:  I have pearsonaly reviewed this: ECG REPORT  Rate:108  Rhythm: Sinus tachycardia ST&T Change: No evidence of acute ischemia but evidence of hypertrophy  UA 3-6 WBC and 3-6 RBC equivocal   Cultures:    Component Value Date/Time   SDES URINE, CLEAN [facility] 02/26/2011  [place] 02/26/2011 1422   CULT Multiple bacterial morphotypes present, none predominant. Suggest appropriate recollection if clinically indicated. 02/26/2011 1422   REPTSTATUS 02/28/2011 FINAL 02/26/2011 1422       Radiological Exams on Admission: Dg Chest 2 View  03/09/2013   CLINICAL DATA:  Chest pain and shortness of breath.  EXAM: CHEST  2 VIEW  COMPARISON:  CT ANGIO CHEST W/CM &/OR WO/CM dated 02/09/2013; DG CHEST 1V PORT dated 02/09/2013  FINDINGS: Two views of the chest demonstrate hyperinflation. Heart size is stable. The trachea is midline. No focal airspace disease or edema.  IMPRESSION: No acute chest findings.   Electronically Signed   By: Markus Daft M.D.   On: 03/09/2013 17:55    Chart has been reviewed  Assessment/Plan  63 yo F with hx of chronic pancreatitis, in the setting of ongoing alcohol abuse and cirrhosis here with abdominal pain self-reported nausea and vomiting.   Present on Admission:  . Acute alcoholic pancreatitis - we'll make n.p.o., IV fluids, spoke to patient about importance of quitting alcohol.  Will put on CIWA protocol. This episode appears to be milder than previous. Anticipate quick turnaround  . 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 Thrombocytopenia likely consistent with cirrhosis.  Marland Kitchen PANCREATITIS, CHRONIC - alcohol induced had started on Creon  . Abdominal pain, acute - patient has history of chronic abdominal pain states currently  she had hard time for the redness at home. Patient appears to be a will to tolerate by mouth intake as this speaks to her will restart her home medications. Given history of chronic narcotic use will attempt to avoid IV narcotics  . HTN (hypertension) - currently blood pressure stable  . Thrombocytopenia - continues to worsen likely secondary to cirrhosis  . Protein-calorie malnutrition, severe - likely alcohol induced patient has poor by mouth intake. Will write for nutrition consult and prealbumin  . Tobacco abuse - spoke about importance of quitting patient has history of COPD wrote for smoking cessation counseling COPD though make sure she has nebulizer treatments and Mucinex written for  . Alcohol withdrawal syndrome with perceptual disturbance - CIWA  Protocol,  watch for signs of withdrawal Heart murmur - given abnormal ECG and hx of ETOH abuse will  obtain echo  Prophylaxis: SCD, Protonix  CODE STATUS: FULL CODE  Other plan as per orders.  I have spent a total of 55 min on this admission  Talton Delpriore 03/09/2013, 9:16 PM

## 2013-03-09 NOTE — Progress Notes (Signed)
Pt yelling and cursing. Pt refuses to answer health history questions at present time on nursing admission hx. Pt's rn informed. Lucius Conn BSN, RN-BC Admissions RN  03/09/2013 9:01 PM

## 2013-03-09 NOTE — ED Notes (Signed)
Pt states that 1000 today was her last drink, which is unusually long for her. Pt states she drinks 2 24-oz cans of beer daily.

## 2013-03-09 NOTE — ED Provider Notes (Signed)
CSN: [id number]     Arrival date & time 03/09/13  1632 History   First MD Initiated Contact with Patient 03/09/13 1710     Chief Complaint  Patient presents with  . Abdominal Pain  . Alcohol Intoxication     (Consider location/radiation/quality/duration/timing/severity/associated sxs/prior Treatment) HPI 63 year old female with chronic abdominal pain chronic pancreatitis chronic alcoholism was admitted to the hospital about a month ago with pancreatitis with exacerbation of abdominal pain and chest pain with CT scan negative for pseudocyst or abscess but did show pancreatitis; CT angio chest showed no pulmonary embolism; since discharge was back to baseline epigastric abdominal pain chronic pain level for a couple weeks but now over the last several days has a flare up again if worsening then baseline and epigastric abdominal pain with recurrence of several episodes per day of nonbloody vomiting and diarrhea the last several days with no fever, she has chronic cough chronic shortness breath unchanged, she does have chest pain 24 hours a day for several days since her flare up of abdominal pain which is typical for her; she is no dysuria; she has been drinking alcohol again and still smokes despite being told to discontinue both and was reminded again to discontinue smoking tobacco and drinking alcohol. There is no treatment prior to arrival. Past Medical History  Diagnosis Date  . Pancreatitis   . COPD (chronic obstructive pulmonary disease)   . Substance abuse   . Cancer     renal ca  . Seizures   . Pancreatitis    Past Surgical History  Procedure Laterality Date  . Partial gastrectomy     Family History  Problem Relation Age of Onset  . CAD Mother   . Alcoholism Father    History  Substance Use Topics  . Smoking status: Current Every Day Smoker -- 0.50 packs/day    Types: Cigarettes  . Smokeless tobacco: Never Used  . Alcohol Use: Yes     Comment: Hx of Alcohol abuse, reports  she last drink was this AM, 24 oz X2 of beer per day   OB History   Grav Para Term Preterm Abortions TAB SAB Ect Mult Living                 Review of Systems 10 Systems reviewed and are negative for acute change except as noted in the HPI.   Allergies  Aspirin and Chlorpromazine hcl  Home Medications   No current outpatient prescriptions on file. BP 158/88  Pulse 88  Temp(Src) 97.7 F (36.5 C) (Oral)  Resp 16  Ht 5\' 4"  (1.626 m)  Wt 88 lb 8 oz (40.143 kg)  BMI 15.18 kg/m2  SpO2 100% Physical Exam  Nursing note and vitals reviewed. Constitutional:  Awake, alert, nontoxic appearance. Cachectic appearing  HENT:  Head: Atraumatic.  Eyes: Right eye exhibits no discharge. Left eye exhibits no discharge.  Neck: Neck supple.  Cardiovascular: Normal rate and regular rhythm.   No murmur heard. Pulmonary/Chest: Effort normal. No respiratory distress. She has wheezes. She has no rales. She exhibits no tenderness.  Scattered mild wheezes which he states is baseline for her, pulse oximetry normal room air 99%; speaks full sentences with no retractions no accessory muscle usage  Abdominal: Soft. Bowel sounds are normal. She exhibits no distension and no mass. There is tenderness. There is guarding. There is no rebound.  Moderate epigastric tenderness without rebound the rest of the abdomen is nontender  Musculoskeletal: She exhibits no edema and no tenderness.  Baseline ROM, no obvious new focal weakness.  Neurological: She is alert.  Mental status and motor strength appears baseline for patient and situation.  Skin: No rash noted.  Psychiatric: She has a normal mood and affect.    ED Course  Procedures (including critical care time) Pt states not improved enough for discharge. D/w [facility] for admit. Patient informed of clinical course, understand medical decision-making process, and agree with plan. Doubt sepsis, peritonitis. Labs Review Labs Reviewed  CBC WITH DIFFERENTIAL -  Abnormal; Notable for the following:    Hemoglobin 15.3 (*)    Platelets 140 (*)    All other components within normal limits  COMPREHENSIVE METABOLIC PANEL - Abnormal; Notable for the following:    Sodium 136 (*)    Chloride 94 (*)    AST 44 (*)    Alkaline Phosphatase 153 (*)    All other components within normal limits  LIPASE, BLOOD - Abnormal; Notable for the following:    Lipase 215 (*)    All other components within normal limits  URINALYSIS, ROUTINE W REFLEX MICROSCOPIC - Abnormal; Notable for the following:    Specific Gravity, Urine 1.001 (*)    Hgb urine dipstick SMALL (*)    Leukocytes, UA MODERATE (*)    All other components within normal limits  ETHANOL - Abnormal; Notable for the following:    Alcohol, Ethyl (B) 153 (*)    All other components within normal limits  URINE MICROSCOPIC-ADD ON - Abnormal; Notable for the following:    Bacteria, UA FEW (*)    All other components within normal limits  COMPREHENSIVE METABOLIC PANEL - Abnormal; Notable for the following:    Potassium 3.4 (*)    Calcium 8.2 (*)    Total Protein 5.6 (*)    Albumin 2.7 (*)    All other components within normal limits  PREALBUMIN - Abnormal; Notable for the following:    Prealbumin 16.0 (*)    All other components within normal limits  LIPASE, BLOOD - Abnormal; Notable for the following:    Lipase 125 (*)    All other components within normal limits  URINE RAPID DRUG SCREEN (HOSP PERFORMED) - Abnormal; Notable for the following:    Opiates POSITIVE (*)    Benzodiazepines POSITIVE (*)    All other components within normal limits  PROTIME-INR  MAGNESIUM  PHOSPHORUS  TSH  CBC  I-STAT TROPOININ, ED   Imaging Review Dg Chest 2 View  03/09/2013   CLINICAL DATA:  Chest pain and shortness of breath.  EXAM: CHEST  2 VIEW  COMPARISON:  CT ANGIO CHEST W/CM &/OR WO/CM dated 02/09/2013; DG CHEST 1V PORT dated 02/09/2013  FINDINGS: Two views of the chest demonstrate hyperinflation. Heart size is  stable. The trachea is midline. No focal airspace disease or edema.  IMPRESSION: No acute chest findings.   Electronically Signed   By: Markus Daft M.D.   On: 03/09/2013 17:55   Dg Abd Portable 1v  03/10/2013   CLINICAL DATA:  Abdominal pain  EXAM: PORTABLE ABDOMEN - 1 VIEW  COMPARISON:  None.  FINDINGS: The stool burden within the colon is moderately increased. There is no significant small or large bowel dilation. There is gas within the stomach as well as within the rectum. There are degenerative changes of the lumbar spine and of the hips. The patient has undergone previous ORIF for left hip fracture. There are densities within the pelvis consistent with arterial calcifications as well as phleboliths and likely  retained contrast within diverticulum.  IMPRESSION: The bowel gas pattern is nonspecific. There is no evidence of ileus nor of obstruction. A moderately increased stool burden is present within the colon.   Electronically Signed   By: David  Martinique   On: 03/10/2013 12:48     EKG Interpretation   Date/Time:  Friday March 09 2013 16:43:20 EST Ventricular Rate:  101 PR Interval:  125 QRS Duration: 69 QT Interval:  333 QTC Calculation: 432 R Axis:   85 Text Interpretation:  Sinus tachycardia Multiple ventricular premature  complexes Consider right atrial enlargement Anteroseptal infarct, age  indeterminate Baseline wander in lead(s) II No significant change since  last tracing Confirmed by [facility]  MD, Jenny Reichmann ([id number]) on 03/09/2013 5:26:03 PM      MDM   Final diagnoses:  Pancreatitis  Chronic abdominal pain  Alcohol abuse    The patient appears reasonably stabilized for admission considering the current resources, flow, and capabilities available in the ED at this time, and I doubt any other [facility] requiring further screening and/or treatment in the ED prior to admission.    Babette Relic, MD 03/11/13 [phone]

## 2013-03-09 NOTE — ED Notes (Addendum)
Per EMS, Pt, from home, c/o upper abdominal pain, n/v/d, and ETOH intoxication x "around 2 weeks."  Pain score 10/10.  Hx of pancreatitis.  Pt reports has not drank any more than her normal.

## 2013-03-09 NOTE — ED Notes (Signed)
Pt said she had just went and didn't collect a sample

## 2013-03-10 ENCOUNTER — Inpatient Hospital Stay (HOSPITAL_COMMUNITY): Payer: Medicaid Other

## 2013-03-10 DIAGNOSIS — R52 Pain, unspecified: Secondary | ICD-10-CM

## 2013-03-10 DIAGNOSIS — G8929 Other chronic pain: Secondary | ICD-10-CM

## 2013-03-10 DIAGNOSIS — R109 Unspecified abdominal pain: Secondary | ICD-10-CM

## 2013-03-10 DIAGNOSIS — F1010 Alcohol abuse, uncomplicated: Secondary | ICD-10-CM

## 2013-03-10 DIAGNOSIS — K861 Other chronic pancreatitis: Secondary | ICD-10-CM

## 2013-03-10 LAB — COMPREHENSIVE METABOLIC PANEL
ALBUMIN: 2.7 g/dL — AB (ref 3.5–5.2)
ALK PHOS: 115 U/L (ref 39–117)
ALT: 18 U/L (ref 0–35)
AST: 31 U/L (ref 0–37)
BILIRUBIN TOTAL: 0.5 mg/dL (ref 0.3–1.2)
BUN: 7 mg/dL (ref 6–23)
CHLORIDE: 104 meq/L (ref 96–112)
CO2: 24 meq/L (ref 19–32)
Calcium: 8.2 mg/dL — ABNORMAL LOW (ref 8.4–10.5)
Creatinine, Ser: 0.63 mg/dL (ref 0.50–1.10)
GLUCOSE: 73 mg/dL (ref 70–99)
POTASSIUM: 3.4 meq/L — AB (ref 3.7–5.3)
Sodium: 139 mEq/L (ref 137–147)
Total Protein: 5.6 g/dL — ABNORMAL LOW (ref 6.0–8.3)

## 2013-03-10 LAB — CBC
HCT: 37.7 % (ref 36.0–46.0)
Hemoglobin: 12.2 g/dL (ref 12.0–15.0)
MCH: 29.8 pg (ref 26.0–34.0)
MCHC: 32.4 g/dL (ref 30.0–36.0)
MCV: 92 fL (ref 78.0–100.0)
PLATELETS: DECREASED 10*3/uL (ref 150–400)
RBC: 4.1 MIL/uL (ref 3.87–5.11)
RDW: 13.9 % (ref 11.5–15.5)
WBC: 4.1 10*3/uL (ref 4.0–10.5)

## 2013-03-10 LAB — TSH: TSH: 1.799 u[IU]/mL (ref 0.350–4.500)

## 2013-03-10 LAB — MAGNESIUM: Magnesium: 1.6 mg/dL (ref 1.5–2.5)

## 2013-03-10 LAB — RAPID URINE DRUG SCREEN, HOSP PERFORMED
AMPHETAMINES: NOT DETECTED
Barbiturates: NOT DETECTED
Benzodiazepines: POSITIVE — AB
Cocaine: NOT DETECTED
OPIATES: POSITIVE — AB
Tetrahydrocannabinol: NOT DETECTED

## 2013-03-10 LAB — LIPASE, BLOOD: LIPASE: 125 U/L — AB (ref 11–59)

## 2013-03-10 LAB — PHOSPHORUS: Phosphorus: 3.4 mg/dL (ref 2.3–4.6)

## 2013-03-10 LAB — PREALBUMIN: PREALBUMIN: 16 mg/dL — AB (ref 17.0–34.0)

## 2013-03-10 MED ORDER — KETOROLAC TROMETHAMINE 15 MG/ML IJ SOLN
15.0000 mg | Freq: Four times a day (QID) | INTRAMUSCULAR | Status: DC | PRN
  Administered 2013-03-10 – 2013-03-11 (×2): 15 mg via INTRAVENOUS
  Filled 2013-03-10 (×2): qty 1

## 2013-03-10 MED ORDER — PEG 3350-KCL-NA BICARB-NACL 420 G PO SOLR
4000.0000 mL | Freq: Once | ORAL | Status: AC
  Administered 2013-03-10: 4000 mL via ORAL

## 2013-03-10 MED ORDER — POLYETHYLENE GLYCOL 3350 17 G PO PACK
17.0000 g | PACK | Freq: Every day | ORAL | Status: DC
  Administered 2013-03-10: 17 g via ORAL
  Filled 2013-03-10 (×2): qty 1

## 2013-03-10 MED ORDER — BISACODYL 10 MG RE SUPP
10.0000 mg | Freq: Once | RECTAL | Status: AC
  Administered 2013-03-10: 10 mg via RECTAL
  Filled 2013-03-10: qty 1

## 2013-03-10 MED ORDER — LUBIPROSTONE 24 MCG PO CAPS
24.0000 ug | ORAL_CAPSULE | Freq: Two times a day (BID) | ORAL | Status: DC
  Administered 2013-03-10 – 2013-03-11 (×3): 24 ug via ORAL
  Filled 2013-03-10 (×4): qty 1

## 2013-03-10 NOTE — Progress Notes (Signed)
[facility] PROGRESS NOTE  [name] OMV:[id number] DOB: 10-04-1950 DOA: 03/09/2013 PCP: Ricke Hey, MD  Assessment/Plan: . Possible acute alcoholic pancreatitis - Cont NPO status. On CIWA protocol. This episode appears to be milder than previous. 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 Thrombocytopenia likely consistent with cirrhosis.  Marland Kitchen PANCREATITIS, CHRONIC - alcohol induced had started on Creon  . Abdominal pain, acute - patient has history of chronic abdominal pain states currently she had hard time for the redness at home. Patient appears to be a will to tolerate by mouth intake as this speaks to her will restart her home medications. Given history of chronic narcotic use will attempt to avoid IV narcotics  . HTN (hypertension) - currently blood pressure stable  . Thrombocytopenia - Likely secondary to cirrhosis. Cont to monitor . Protein-calorie malnutrition, severe - likely alcohol induced. Nutrition consulted . Tobacco abuse - spoke about importance of quitting patient has history of COPD wrote for smoking cessation counseling   COPD though make sure she has nebulizer treatments and Mucinex written for  . Alcohol withdrawal syndrome with perceptual disturbance - CIWA Protocol, watch for signs of withdrawal   Heart murmur - given abnormal ECG and hx of ETOH abuse will obtain echo  Constipation - noted on abd xray this AM per my own read. Will give a trial of soap suds enema  Code Status: Full Family Communication: Pt in room (indicate person spoken with, relationship, and if by phone, the number) Disposition Plan: Pending   HPI/Subjective: Still complains of generalized abd pain.  Objective: Filed Vitals:   03/09/13 2240 03/10/13 0201 03/10/13 0633 03/10/13 0754  BP: 175/111 148/92 155/96   Pulse: 82 61 59   Temp: 97.7 F (36.5 C) 97.8 F (36.6 C) 97.6 F (36.4 C)   TempSrc: Oral Oral Oral   Resp: 14 14 14    SpO2: 99% 100% 100% 99%    Intake/Output  Summary (Last 24 hours) at 03/10/13 1014 Last data filed at 03/10/13 0647  Gross per 24 hour  Intake 1243.75 ml  Output      0 ml  Net 1243.75 ml   There were no vitals filed for this visit.  Exam:   General:  Awake, in nad  Cardiovascular: regular, s1, s2  Respiratory: normal resp effort, no wheezing  Abdomen: generally tender, decreased BS, palpable stool  Musculoskeletal: perfused, no clubbing   Data Reviewed: Basic Metabolic Panel:  Recent Labs Lab 03/09/13 1821 03/10/13 0545  NA 136* 139  K 3.8 3.4*  CL 94* 104  CO2 23 24  GLUCOSE 79 73  BUN 6 7  CREATININE 0.73 0.63  CALCIUM 9.5 8.2*  MG  --  1.6  PHOS  --  3.4   Liver Function Tests:  Recent Labs Lab 03/09/13 1821 03/10/13 0545  AST 44* 31  ALT 24 18  ALKPHOS 153* 115  BILITOT 0.4 0.5  PROT 7.3 5.6*  ALBUMIN 3.5 2.7*    Recent Labs Lab 03/09/13 1821 03/10/13 0545  LIPASE 215* 125*   No results found for this basename: AMMONIA,  in the last 168 hours CBC:  Recent Labs Lab 03/09/13 1821 03/10/13 0545  WBC 7.3 4.1  NEUTROABS 4.6  --   HGB 15.3* 12.2  HCT 44.8 37.7  MCV 90.7 92.0  PLT 140* PLATELET CLUMPS NOTED ON SMEAR, COUNT APPEARS DECREASED   Cardiac Enzymes: No results found for this basename: CKTOTAL, CKMB, CKMBINDEX, TROPONINI,  in the last 168 hours BNP (last 3 results)  No results found for this basename: PROBNP,  in the last 8760 hours CBG: No results found for this basename: GLUCAP,  in the last 168 hours  No results found for this or any previous visit (from the past 240 hour(s)).   Studies: Dg Chest 2 View  03/09/2013   CLINICAL DATA:  Chest pain and shortness of breath.  EXAM: CHEST  2 VIEW  COMPARISON:  CT ANGIO CHEST W/CM &/OR WO/CM dated 02/09/2013; DG CHEST 1V PORT dated 02/09/2013  FINDINGS: Two views of the chest demonstrate hyperinflation. Heart size is stable. The trachea is midline. No focal airspace disease or edema.  IMPRESSION: No acute chest findings.    Electronically Signed   By: Markus Daft M.D.   On: 03/09/2013 17:55    Scheduled Meds: . fluticasone  2 puff Inhalation BID  . folic acid  1 mg Oral Daily  . guaiFENesin  600 mg Oral BID  . ipratropium-albuterol  3 mL Nebulization TID  . lipase/protease/amylase  1 capsule Oral TID AC  . LORazepam  0-4 mg Intravenous Q6H   Followed by  . [START ON 03/12/2013] LORazepam  0-4 mg Intravenous Q12H  . multivitamin with minerals  1 tablet Oral Daily  . nicotine  21 mg Transdermal Daily  . pantoprazole  40 mg Oral Q1200  . thiamine  100 mg Oral Daily   Or  . thiamine  100 mg Intravenous Daily   Continuous Infusions: . sodium chloride 125 mL/hr at 03/10/13 3419    Active Problems:   CIRRHOSIS   PANCREATITIS, CHRONIC   Abdominal pain, acute   Thrombocytopenia   HTN (hypertension), malignant   Acute alcoholic pancreatitis   Protein-calorie malnutrition, severe   Pancreatitis   Tobacco abuse   Alcohol withdrawal syndrome with perceptual disturbance  Time spent: 36min  [facility], [facility] Pager [phone]. If 7PM-7AM, please contact night-coverage at [email or web address], password [facility] 03/10/2013, 10:14 AM  LOS: 1 day

## 2013-03-11 DIAGNOSIS — K861 Other chronic pancreatitis: Secondary | ICD-10-CM

## 2013-03-11 DIAGNOSIS — K859 Acute pancreatitis without necrosis or infection, unspecified: Secondary | ICD-10-CM

## 2013-03-11 DIAGNOSIS — F1010 Alcohol abuse, uncomplicated: Secondary | ICD-10-CM

## 2013-03-11 DIAGNOSIS — R109 Unspecified abdominal pain: Secondary | ICD-10-CM

## 2013-03-11 DIAGNOSIS — G8929 Other chronic pain: Secondary | ICD-10-CM

## 2013-03-11 DIAGNOSIS — R52 Pain, unspecified: Secondary | ICD-10-CM

## 2013-03-11 MED ORDER — SORBITOL 70 % SOLN
30.0000 mL | Status: AC
  Administered 2013-03-11: 30 mL via ORAL
  Filled 2013-03-11: qty 30

## 2013-03-11 MED ORDER — IPRATROPIUM-ALBUTEROL 0.5-2.5 (3) MG/3ML IN SOLN: 3.0000 mL | Freq: Four times a day (QID) | RESPIRATORY_TRACT | Status: DC

## 2013-03-11 MED ORDER — BOOST / RESOURCE BREEZE PO LIQD
1.0000 | Freq: Three times a day (TID) | ORAL | Status: DC
  Administered 2013-03-11 (×2): 1 via ORAL

## 2013-03-11 NOTE — Progress Notes (Signed)
INITIAL NUTRITION ASSESSMENT  DOCUMENTATION CODES Per approved criteria  -Severe malnutrition in the context of chronic illness -Underweight   Patient meets the criteria for severe MALNUTRITION in the context of chronic illness with PO intake <75% of estimated needs > 1 month and severe muscle and subcutaneous fat wasting in multiple body regions.   INTERVENTION: - Resource Breeze TID, each provides 250 kcal, 9 g protein - Diet advancement as tolerated per MD. Recommend Low Fat diet.  - Patient educated on the importance of consuming a lower fat diet after discharge to prevent pancreatitis flair up. She was advised to avoid fried foods and limit addition of fat to foods. She was encouraged to focus on protein foods as a way to increase calories and promote lean tissue gain.   NUTRITION DIAGNOSIS: Inadequate oral intake related to nausea, abdominal pain, ETOH abuse as evidenced by minimal intake for 2 weeks.   Goal: Patient will meet >/=90% of estimated nutrition needs  Monitor:  Diet advancement, PO intake, weight, labs  Reason for Assessment: Consult  63 y.o. female  Admitting Dx:   ASSESSMENT: Patient with history of ETOH abuse, chronic pancreatitis, recently admitted 02/09/2013 for acute alcoholic pancreatitis, readmitted with nausea and persistent abdominal pain.   She reports minimal PO intake for the last 2 weeks due to abdominal pain. Anything she ate made her nauseous. She has continued to drink 2, 24 oz beers daily. Patient has always been small framed, with a usual body weight of 90-95 pounds. Her weight is down 4 pounds from last admission (4% over 1 month). Intake is improved with 50% intake of Clear Liquids. Diet to be advanced to Full Liquids. Patient reports that she wants to eat solid foods. She may be discharged later today, but is willing to try nutrition supplements while she is here. Provided her with a Facilities manager at bedside.   Patient with severe malnutrition  in the context of chronic illness from last admission due to severe muscle and subcutaneous fat wasting in multiple regions. This persist.   Prealbumin 16. Phosphorus and magnesium WNL, potassium low. Patient at risk for refeeding syndrome due to minimal PO intake over >1 month.   Nutrition Focused Physical Exam:  Subcutaneous Fat:  Orbital Region: severe wasting Upper Arm Region: severe wasting Thoracic and Lumbar Region: severe wasting  Muscle:  Temple Region: severe wasting Clavicle Bone Region: severe wasting Clavicle and Acromion Bone Region: severe wasting Scapular Bone Region: severe wasting Dorsal Hand: severe wasting Patellar Region: severe wasting Anterior Thigh Region: severe wasting Posterior Calf Region: severe wasting  Edema: WNL    Height: Ht Readings from Last 1 Encounters:  03/11/13 5\' 4"  (1.626 m)    Weight: Wt Readings from Last 1 Encounters:  03/11/13 88 lb 8 oz (40.143 kg)    Ideal Body Weight: 120 pounds  % Ideal Body Weight: 73%  Wt Readings from Last 10 Encounters:  03/11/13 88 lb 8 oz (40.143 kg)  02/09/13 92 lb 9.5 oz (42 kg)  07/14/11 88 lb (39.917 kg)  02/25/11 203 lb 11.3 oz (92.4 kg)  12/02/10 145 lb 3 oz (65.857 kg)  08/02/10 80 lb (36.288 kg)  04/08/10 82 lb (37.195 kg)  10/18/08 95 lb 6.4 oz (43.273 kg)    Usual Body Weight: 90-95 pounds  % Usual Body Weight: 93-98%  BMI:  Body mass index is 15.18 kg/(m^2). Patient is underweight.   Estimated Nutritional Needs: Kcal: 1450-1550 kcal Protein: 60-70 g Fluid: >1.4 L/day  Skin: Intact  Diet Order: Clear Liquid  EDUCATION NEEDS: -Education needs addressed   Intake/Output Summary (Last 24 hours) at 03/11/13 1007 Last data filed at 03/10/13 2001  Gross per 24 hour  Intake 1247.08 ml  Output   1150 ml  Net  97.08 ml    Last BM: 3/8   Labs:   Recent Labs Lab 03/09/13 1821 03/10/13 0545  NA 136* 139  K 3.8 3.4*  CL 94* 104  CO2 23 24  BUN 6 7  CREATININE  0.73 0.63  CALCIUM 9.5 8.2*  MG  --  1.6  PHOS  --  3.4  GLUCOSE 79 73    CBG (last 3)  No results found for this basename: GLUCAP,  in the last 72 hours  Scheduled Meds: . fluticasone  2 puff Inhalation BID  . folic acid  1 mg Oral Daily  . guaiFENesin  600 mg Oral BID  . ipratropium-albuterol  3 mL Nebulization TID  . lipase/protease/amylase  1 capsule Oral TID AC  . LORazepam  0-4 mg Intravenous Q6H   Followed by  . [START ON 03/12/2013] LORazepam  0-4 mg Intravenous Q12H  . lubiprostone  24 mcg Oral BID WC  . multivitamin with minerals  1 tablet Oral Daily  . nicotine  21 mg Transdermal Daily  . pantoprazole  40 mg Oral Q1200  . polyethylene glycol  17 g Oral Daily  . thiamine  100 mg Oral Daily   Or  . thiamine  100 mg Intravenous Daily    Continuous Infusions: . sodium chloride 125 mL/hr at 03/10/13 1428    Past Medical History  Diagnosis Date  . Pancreatitis   . COPD (chronic obstructive pulmonary disease)   . Substance abuse   . Cancer     renal ca  . Seizures   . Pancreatitis     Past Surgical History  Procedure Laterality Date  . Partial gastrectomy      Larey Seat, RD, LDN Pager #: [phone] After-Hours Pager #: [phone]

## 2013-03-11 NOTE — Progress Notes (Signed)
Pt frequently asking for pain medicine or ativan,

## 2013-03-11 NOTE — Progress Notes (Signed)
Pt refuses to eat a Heart healthy diet before she leaves to see if she can tolerate it. Pt tolerated a full liquid diet for lunch. Dr. Wyline Copas informed.

## 2013-03-11 NOTE — Progress Notes (Signed)
[facility] PROGRESS NOTE  [name] KPT:[id number] DOB: 1950-07-30 DOA: 03/09/2013 PCP: Ricke Hey, MD  Assessment/Plan: . Possible acute alcoholic pancreatitis - Cont NPO status. On CIWA protocol. This episode appears to be milder than previous. 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 Thrombocytopenia likely consistent with cirrhosis.  Marland Kitchen PANCREATITIS, CHRONIC - alcohol induced had started on Creon  . Abdominal pain, acute - patient has history of chronic abdominal pain states currently she had hard time for the redness at home. Patient appears to be a will to tolerate by mouth intake as this speaks to her will restart her home medications. Given history of chronic narcotic use will attempt to avoid IV narcotics  . HTN (hypertension) - currently blood pressure stable  . Thrombocytopenia - Likely secondary to cirrhosis. Cont to monitor . Protein-calorie malnutrition, severe - likely alcohol induced. Nutrition consulted . Tobacco abuse - spoke about importance of quitting patient has history of COPD wrote for smoking cessation counseling   COPD though make sure she has nebulizer treatments and Mucinex written for  . Alcohol withdrawal syndrome with perceptual disturbance - CIWA Protocol, watch for signs of withdrawal   Heart murmur - given abnormal ECG and hx of ETOH abuse will obtain echo  Constipation - noted on abd xray this AM per my own read. Will give a trial of soap suds enema  Code Status: Full Family Communication: Pt in room (indicate person spoken with, relationship, and if by phone, the number) Disposition Plan: Pending   HPI/Subjective: Reports feeling better after BM overnight.  Objective: Filed Vitals:   03/10/13 2208 03/11/13 0555 03/11/13 0822 03/11/13 1000  BP: 143/93 152/91  124/81  Pulse: 81 61  82  Temp: 97.5 F (36.4 C) 97.3 F (36.3 C)  97.7 F (36.5 C)  TempSrc: Oral Oral  Oral  Resp: 18 18  16   Height:  5\' 4"  (1.626 m)    Weight:   40.143 kg (88 lb 8 oz)    SpO2: 100% 100% 100% 100%    Intake/Output Summary (Last 24 hours) at 03/11/13 1111 Last data filed at 03/10/13 2001  Gross per 24 hour  Intake 1247.08 ml  Output   1150 ml  Net  97.08 ml   Filed Weights   03/11/13 0555  Weight: 40.143 kg (88 lb 8 oz)    Exam:   General:  Awake, in nad  Cardiovascular: regular, s1, s2  Respiratory: normal resp effort, no wheezing  Abdomen: generally tender, decreased BS, palpable stool  Musculoskeletal: perfused, no clubbing   Data Reviewed: Basic Metabolic Panel:  Recent Labs Lab 03/09/13 1821 03/10/13 0545  NA 136* 139  K 3.8 3.4*  CL 94* 104  CO2 23 24  GLUCOSE 79 73  BUN 6 7  CREATININE 0.73 0.63  CALCIUM 9.5 8.2*  MG  --  1.6  PHOS  --  3.4   Liver Function Tests:  Recent Labs Lab 03/09/13 1821 03/10/13 0545  AST 44* 31  ALT 24 18  ALKPHOS 153* 115  BILITOT 0.4 0.5  PROT 7.3 5.6*  ALBUMIN 3.5 2.7*    Recent Labs Lab 03/09/13 1821 03/10/13 0545  LIPASE 215* 125*   No results found for this basename: AMMONIA,  in the last 168 hours CBC:  Recent Labs Lab 03/09/13 1821 03/10/13 0545  WBC 7.3 4.1  NEUTROABS 4.6  --   HGB 15.3* 12.2  HCT 44.8 37.7  MCV 90.7 92.0  PLT 140* PLATELET CLUMPS NOTED ON SMEAR, COUNT  APPEARS DECREASED   Cardiac Enzymes: No results found for this basename: CKTOTAL, CKMB, CKMBINDEX, TROPONINI,  in the last 168 hours BNP (last 3 results) No results found for this basename: PROBNP,  in the last 8760 hours CBG: No results found for this basename: GLUCAP,  in the last 168 hours  No results found for this or any previous visit (from the past 240 hour(s)).   Studies: Dg Chest 2 View  03/09/2013   CLINICAL DATA:  Chest pain and shortness of breath.  EXAM: CHEST  2 VIEW  COMPARISON:  CT ANGIO CHEST W/CM &/OR WO/CM dated 02/09/2013; DG CHEST 1V PORT dated 02/09/2013  FINDINGS: Two views of the chest demonstrate hyperinflation. Heart size is stable. The  trachea is midline. No focal airspace disease or edema.  IMPRESSION: No acute chest findings.   Electronically Signed   By: Markus Daft M.D.   On: 03/09/2013 17:55   Dg Abd Portable 1v  03/10/2013   CLINICAL DATA:  Abdominal pain  EXAM: PORTABLE ABDOMEN - 1 VIEW  COMPARISON:  None.  FINDINGS: The stool burden within the colon is moderately increased. There is no significant small or large bowel dilation. There is gas within the stomach as well as within the rectum. There are degenerative changes of the lumbar spine and of the hips. The patient has undergone previous ORIF for left hip fracture. There are densities within the pelvis consistent with arterial calcifications as well as phleboliths and likely retained contrast within diverticulum.  IMPRESSION: The bowel gas pattern is nonspecific. There is no evidence of ileus nor of obstruction. A moderately increased stool burden is present within the colon.   Electronically Signed   By: David  Martinique   On: 03/10/2013 12:48    Scheduled Meds: . fluticasone  2 puff Inhalation BID  . folic acid  1 mg Oral Daily  . guaiFENesin  600 mg Oral BID  . ipratropium-albuterol  3 mL Nebulization TID  . lipase/protease/amylase  1 capsule Oral TID AC  . LORazepam  0-4 mg Intravenous Q6H   Followed by  . [START ON 03/12/2013] LORazepam  0-4 mg Intravenous Q12H  . lubiprostone  24 mcg Oral BID WC  . multivitamin with minerals  1 tablet Oral Daily  . nicotine  21 mg Transdermal Daily  . pantoprazole  40 mg Oral Q1200  . polyethylene glycol  17 g Oral Daily  . thiamine  100 mg Oral Daily   Or  . thiamine  100 mg Intravenous Daily   Continuous Infusions: . sodium chloride 125 mL/hr at 03/10/13 1428    Active Problems:   CIRRHOSIS   PANCREATITIS, CHRONIC   Abdominal pain, acute   Thrombocytopenia   HTN (hypertension), malignant   Acute alcoholic pancreatitis   Protein-calorie malnutrition, severe   Pancreatitis   Tobacco abuse   Alcohol withdrawal syndrome  with perceptual disturbance  Time spent: 58min  Aden Sek, [facility] Pager [phone]. If 7PM-7AM, please contact night-coverage at [email or web address], password [facility] 03/11/2013, 11:11 AM  LOS: 2 days

## 2013-03-11 NOTE — Discharge Instructions (Signed)
You were dispensed a 30 day supply of dilaudid on 02/15/13 (120 tabs) and a 30 day supply of ativan (90 tabs) on 02/14/13, so these will not be prescribed by me. Please see your original prescribing physician for refills.

## 2013-03-11 NOTE — Discharge Summary (Signed)
Physician Discharge Summary  [name]:[id number] DOB: 08-29-1950 DOA: 03/09/2013  PCP: Ricke Hey, MD  Admit date: 03/09/2013 Discharge date: 03/11/2013  Time spent: 40 minutes  Recommendations for Outpatient Follow-up:  Follow up with PCP in 1-2 weeks Consider outpatient 2d echo ETOH abuse counseling  Discharge Diagnoses:  Principal Problem:   Abdominal pain, acute Active Problems:   CIRRHOSIS   PANCREATITIS, CHRONIC   Thrombocytopenia   HTN (hypertension), malignant   Acute alcoholic pancreatitis   Protein-calorie malnutrition, severe   Pancreatitis   Tobacco abuse   Alcohol withdrawal syndrome with perceptual disturbance   Discharge Condition: Improved  Diet recommendation: High fiber  Filed Weights   03/11/13 0555  Weight: 40.143 kg (88 lb 8 oz)    History of present illness:  [name] is a 63 y.o. female  has a past medical history of Pancreatitis; COPD (chronic obstructive pulmonary disease); Substance abuse; Cancer; Seizures; and Pancreatitis.  Presented with  Patient was recently admitted with exacerbation of chronic pancreatitis and CAP. She states her pain never improved. She continued to take her regular medications and drinking alcohol hoping to help the pain. She endorses epigastric pain. States she presented to ER when pain was intolerable. Endorsed nausea and vomiting poor PO intake. States have not eaten anything in past 2 weeks. Currently drinking from the cup. Denies any fever or chills. She still has some cough but that has improved. Continues to smoke. Reports drinking 2 beers a day 24 oz each. Last drink was this AM. She feels anxious now and tremulous, feels like her skin is crawling.  Hospitalist called for admission.  Hospital Course:  . Possible acute alcoholic pancreatitis - Presenting lipase of just over 215, trended down. The patient was initially continued with NPO status, eventually transitioned to regular PO. Was kept on  CIWA protocol. [place] reviewed. Pt was last prescribed 120 tabs of 4mg  dilaudid (30 day supply) on 02/15/13 with 90 tabs of 1mg  ativan (30 day supply) prescribed on 02/14/13. Pt should still have meds available, therefore will not prescribe additional narcotics.  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 Thrombocytopenia likely consistent with cirrhosis. Remained stable  . PANCREATITIS, CHRONIC - alcohol induced had started on Creon   . Abdominal pain, acute - suspected pancreatitis vs constipation (see below). This was improved with bowel rest as well as bowel regimen for constipation   . HTN (hypertension) - had remained stable   . Thrombocytopenia - Likely secondary to cirrhosis.  . Protein-calorie malnutrition, severe - likely alcohol induced. Nutrition consulted  . Tobacco abuse - spoke about importance of quitting patient has history of COPD wrote for smoking cessation counseling   COPD though make sure she has nebulizer treatments and Mucinex written for   . Alcohol withdrawal syndrome with perceptual disturbance - CIWA Protocol, watch for signs of withdrawal   Heart murmur - ECG reviewed, unchanged from prior ekg. Will likely benefit from outpt 2d Echo  Constipation - noted on abd xray during this admission. Good results with soap suds enema with resultant improvement in presenting symptoms.   Procedures:    Consultations:    Discharge Exam: Filed Vitals:   03/11/13 1100 03/11/13 1432 03/11/13 1606 03/11/13 1649  BP: 158/88 152/82  135/70  Pulse: 88 73  89  Temp:  97.1 F (36.2 C)    TempSrc:  Oral    Resp:  18    Height:      Weight:      SpO2:  100% 97%     General: awake, in nad Cardiovascular: regular s1, s2 Respiratory: normal resp effort, no wheezing  Discharge Instructions     Medication List         fluticasone 110 MCG/ACT inhaler  Commonly known as:  FLOVENT HFA  Inhale 2 puffs into the lungs 2 (two) times daily.     HYDROmorphone 4 MG tablet   Commonly known as:  DILAUDID  Take 4 mg by mouth every 4 (four) hours as needed for severe pain.     LORazepam 1 MG tablet  Commonly known as:  ATIVAN  Take 1 tablet (1 mg total) by mouth every 6 (six) hours as needed. For anxiety.     ondansetron 8 MG tablet  Commonly known as:  ZOFRAN  Take 8 mg by mouth every 8 (eight) hours as needed for nausea or vomiting.       Allergies  Allergen Reactions  . Aspirin Other (See Comments)    bleeding  . Chlorpromazine Hcl Other (See Comments)    Muscle spasms   Follow-up Information   Follow up with Ricke Hey, MD. Schedule an appointment as soon as possible for a visit in 1 week.   Specialty:  Family Medicine   Contact information:   [address] [place] [address] [phone]       The results of significant diagnostics from this hospitalization (including imaging, microbiology, ancillary and laboratory) are listed below for reference.    Significant Diagnostic Studies: Dg Chest 2 View  03/09/2013   CLINICAL DATA:  Chest pain and shortness of breath.  EXAM: CHEST  2 VIEW  COMPARISON:  CT ANGIO CHEST W/CM &/OR WO/CM dated 02/09/2013; DG CHEST 1V PORT dated 02/09/2013  FINDINGS: Two views of the chest demonstrate hyperinflation. Heart size is stable. The trachea is midline. No focal airspace disease or edema.  IMPRESSION: No acute chest findings.   Electronically Signed   By: Markus Daft M.D.   On: 03/09/2013 17:55   Dg Abd Portable 1v  03/10/2013   CLINICAL DATA:  Abdominal pain  EXAM: PORTABLE ABDOMEN - 1 VIEW  COMPARISON:  None.  FINDINGS: The stool burden within the colon is moderately increased. There is no significant small or large bowel dilation. There is gas within the stomach as well as within the rectum. There are degenerative changes of the lumbar spine and of the hips. The patient has undergone previous ORIF for left hip fracture. There are densities within the pelvis consistent with arterial calcifications as  well as phleboliths and likely retained contrast within diverticulum.  IMPRESSION: The bowel gas pattern is nonspecific. There is no evidence of ileus nor of obstruction. A moderately increased stool burden is present within the colon.   Electronically Signed   By: David  Martinique   On: 03/10/2013 12:48    Microbiology: No results found for this or any previous visit (from the past 240 hour(s)).   Labs: Basic Metabolic Panel:  Recent Labs Lab 03/09/13 1821 03/10/13 0545  NA 136* 139  K 3.8 3.4*  CL 94* 104  CO2 23 24  GLUCOSE 79 73  BUN 6 7  CREATININE 0.73 0.63  CALCIUM 9.5 8.2*  MG  --  1.6  PHOS  --  3.4   Liver Function Tests:  Recent Labs Lab 03/09/13 1821 03/10/13 0545  AST 44* 31  ALT 24 18  ALKPHOS 153* 115  BILITOT 0.4 0.5  PROT 7.3 5.6*  ALBUMIN 3.5 2.7*  Recent Labs Lab 03/09/13 1821 03/10/13 0545  LIPASE 215* 125*   No results found for this basename: AMMONIA,  in the last 168 hours CBC:  Recent Labs Lab 03/09/13 1821 03/10/13 0545  WBC 7.3 4.1  NEUTROABS 4.6  --   HGB 15.3* 12.2  HCT 44.8 37.7  MCV 90.7 92.0  PLT 140* PLATELET CLUMPS NOTED ON SMEAR, COUNT APPEARS DECREASED   Cardiac Enzymes: No results found for this basename: CKTOTAL, CKMB, CKMBINDEX, TROPONINI,  in the last 168 hours BNP: BNP (last 3 results) No results found for this basename: PROBNP,  in the last 8760 hours CBG: No results found for this basename: GLUCAP,  in the last 168 hours  Signed:  Evana Runnels K  [facility] 03/11/2013, 5:18 PM

## 2013-03-27 ENCOUNTER — Inpatient Hospital Stay (HOSPITAL_COMMUNITY)
Admission: EM | Admit: 2013-03-27 | Discharge: 2013-04-01 | DRG: 438 | Disposition: A | Discharge status: Home / Self Care | Payer: Medicaid Other | Attending: Internal Medicine | Admitting: Internal Medicine

## 2013-03-27 DIAGNOSIS — I498 Other specified cardiac arrhythmias: Secondary | ICD-10-CM | POA: Diagnosis present

## 2013-03-27 DIAGNOSIS — Z79899 Other long term (current) drug therapy: Secondary | ICD-10-CM

## 2013-03-27 DIAGNOSIS — F10939 Alcohol use, unspecified with withdrawal, unspecified: Secondary | ICD-10-CM

## 2013-03-27 DIAGNOSIS — F10932 Alcohol use, unspecified with withdrawal with perceptual disturbance: Secondary | ICD-10-CM

## 2013-03-27 DIAGNOSIS — K861 Other chronic pancreatitis: Secondary | ICD-10-CM

## 2013-03-27 DIAGNOSIS — I10 Essential (primary) hypertension: Secondary | ICD-10-CM

## 2013-03-27 DIAGNOSIS — F10239 Alcohol dependence with withdrawal, unspecified: Secondary | ICD-10-CM

## 2013-03-27 DIAGNOSIS — R109 Unspecified abdominal pain: Secondary | ICD-10-CM

## 2013-03-27 DIAGNOSIS — F411 Generalized anxiety disorder: Secondary | ICD-10-CM

## 2013-03-27 DIAGNOSIS — R259 Unspecified abnormal involuntary movements: Secondary | ICD-10-CM | POA: Diagnosis present

## 2013-03-27 DIAGNOSIS — D696 Thrombocytopenia, unspecified: Secondary | ICD-10-CM

## 2013-03-27 DIAGNOSIS — Z681 Body mass index (BMI) 19 or less, adult: Secondary | ICD-10-CM

## 2013-03-27 DIAGNOSIS — F13939 Sedative, hypnotic or anxiolytic use, unspecified with withdrawal, unspecified: Secondary | ICD-10-CM

## 2013-03-27 DIAGNOSIS — F1020 Alcohol dependence, uncomplicated: Secondary | ICD-10-CM

## 2013-03-27 DIAGNOSIS — R002 Palpitations: Secondary | ICD-10-CM | POA: Diagnosis present

## 2013-03-27 DIAGNOSIS — G8929 Other chronic pain: Secondary | ICD-10-CM | POA: Diagnosis present

## 2013-03-27 DIAGNOSIS — R0602 Shortness of breath: Secondary | ICD-10-CM

## 2013-03-27 DIAGNOSIS — C649 Malignant neoplasm of unspecified kidney, except renal pelvis: Secondary | ICD-10-CM

## 2013-03-27 DIAGNOSIS — Z720 Tobacco use: Secondary | ICD-10-CM

## 2013-03-27 DIAGNOSIS — Z8711 Personal history of peptic ulcer disease: Secondary | ICD-10-CM

## 2013-03-27 DIAGNOSIS — Z888 Allergy status to other drugs, medicaments and biological substances status: Secondary | ICD-10-CM

## 2013-03-27 DIAGNOSIS — J441 Chronic obstructive pulmonary disease with (acute) exacerbation: Secondary | ICD-10-CM

## 2013-03-27 DIAGNOSIS — F17200 Nicotine dependence, unspecified, uncomplicated: Secondary | ICD-10-CM | POA: Diagnosis present

## 2013-03-27 DIAGNOSIS — K7460 Unspecified cirrhosis of liver: Secondary | ICD-10-CM

## 2013-03-27 DIAGNOSIS — F10232 Alcohol dependence with withdrawal with perceptual disturbance: Secondary | ICD-10-CM

## 2013-03-27 DIAGNOSIS — K852 Alcohol induced acute pancreatitis without necrosis or infection: Secondary | ICD-10-CM

## 2013-03-27 DIAGNOSIS — E43 Unspecified severe protein-calorie malnutrition: Secondary | ICD-10-CM

## 2013-03-27 DIAGNOSIS — E876 Hypokalemia: Secondary | ICD-10-CM

## 2013-03-27 DIAGNOSIS — R52 Pain, unspecified: Secondary | ICD-10-CM | POA: Diagnosis present

## 2013-03-27 DIAGNOSIS — Z886 Allergy status to analgesic agent status: Secondary | ICD-10-CM

## 2013-03-27 DIAGNOSIS — R748 Abnormal levels of other serum enzymes: Secondary | ICD-10-CM | POA: Diagnosis present

## 2013-03-27 DIAGNOSIS — R112 Nausea with vomiting, unspecified: Secondary | ICD-10-CM | POA: Diagnosis present

## 2013-03-27 DIAGNOSIS — K859 Acute pancreatitis without necrosis or infection, unspecified: Principal | ICD-10-CM

## 2013-03-27 DIAGNOSIS — F13239 Sedative, hypnotic or anxiolytic dependence with withdrawal, unspecified: Secondary | ICD-10-CM

## 2013-03-27 DIAGNOSIS — Z85528 Personal history of other malignant neoplasm of kidney: Secondary | ICD-10-CM

## 2013-03-27 DIAGNOSIS — Z903 Acquired absence of stomach [part of]: Secondary | ICD-10-CM

## 2013-03-27 DIAGNOSIS — K219 Gastro-esophageal reflux disease without esophagitis: Secondary | ICD-10-CM

## 2013-03-27 NOTE — ED Notes (Signed)
Bed: WA17 Expected date:  Expected time:  Means of arrival:  Comments: EMS 62yo F, N/V abd pain. Hx of pancreatitis

## 2013-03-28 ENCOUNTER — Inpatient Hospital Stay (HOSPITAL_COMMUNITY): Payer: Medicaid Other

## 2013-03-28 ENCOUNTER — Encounter (HOSPITAL_COMMUNITY): Payer: Self-pay | Admitting: Emergency Medicine

## 2013-03-28 ENCOUNTER — Emergency Department (HOSPITAL_COMMUNITY): Payer: Medicaid Other

## 2013-03-28 DIAGNOSIS — F411 Generalized anxiety disorder: Secondary | ICD-10-CM

## 2013-03-28 DIAGNOSIS — I10 Essential (primary) hypertension: Secondary | ICD-10-CM

## 2013-03-28 DIAGNOSIS — R52 Pain, unspecified: Secondary | ICD-10-CM

## 2013-03-28 DIAGNOSIS — F10939 Alcohol use, unspecified with withdrawal, unspecified: Secondary | ICD-10-CM | POA: Diagnosis present

## 2013-03-28 DIAGNOSIS — F10239 Alcohol dependence with withdrawal, unspecified: Secondary | ICD-10-CM

## 2013-03-28 DIAGNOSIS — K859 Acute pancreatitis without necrosis or infection, unspecified: Secondary | ICD-10-CM | POA: Diagnosis present

## 2013-03-28 DIAGNOSIS — R109 Unspecified abdominal pain: Secondary | ICD-10-CM

## 2013-03-28 DIAGNOSIS — K219 Gastro-esophageal reflux disease without esophagitis: Secondary | ICD-10-CM

## 2013-03-28 LAB — COMPREHENSIVE METABOLIC PANEL
ALK PHOS: 149 U/L — AB (ref 39–117)
ALT: 21 U/L (ref 0–35)
AST: 51 U/L — AB (ref 0–37)
Albumin: 4.3 g/dL (ref 3.5–5.2)
BUN: 8 mg/dL (ref 6–23)
CO2: 26 mEq/L (ref 19–32)
Calcium: 10.9 mg/dL — ABNORMAL HIGH (ref 8.4–10.5)
Chloride: 91 mEq/L — ABNORMAL LOW (ref 96–112)
Creatinine, Ser: 0.56 mg/dL (ref 0.50–1.10)
GFR calc non Af Amer: >90 mL/min (ref >90)
GLUCOSE: 115 mg/dL — AB (ref 70–99)
POTASSIUM: 3.5 meq/L — AB (ref 3.7–5.3)
SODIUM: 136 meq/L — AB (ref 137–147)
TOTAL PROTEIN: 8.6 g/dL — AB (ref 6.0–8.3)
Total Bilirubin: 1 mg/dL (ref 0.3–1.2)

## 2013-03-28 LAB — CBC WITH DIFFERENTIAL/PLATELET
Basophils Absolute: 0 10*3/uL (ref 0.0–0.1)
Basophils Relative: 0 % (ref 0–1)
EOS PCT: 0 % (ref 0–5)
Eosinophils Absolute: 0 10*3/uL (ref 0.0–0.7)
HEMATOCRIT: 45.6 % (ref 36.0–46.0)
HEMOGLOBIN: 15.9 g/dL — AB (ref 12.0–15.0)
LYMPHS ABS: 0.8 10*3/uL (ref 0.7–4.0)
LYMPHS PCT: 6 % — AB (ref 12–46)
MCH: 31.5 pg (ref 26.0–34.0)
MCHC: 34.9 g/dL (ref 30.0–36.0)
MCV: 90.5 fL (ref 78.0–100.0)
MONOS PCT: 9 % (ref 3–12)
Monocytes Absolute: 1.1 10*3/uL — ABNORMAL HIGH (ref 0.1–1.0)
Neutro Abs: 10.4 10*3/uL — ABNORMAL HIGH (ref 1.7–7.7)
Neutrophils Relative %: 84 % — ABNORMAL HIGH (ref 43–77)
Platelets: 209 10*3/uL (ref 150–400)
RBC: 5.04 MIL/uL (ref 3.87–5.11)
RDW: 14.6 % (ref 11.5–15.5)
WBC: 12.3 10*3/uL — AB (ref 4.0–10.5)

## 2013-03-28 LAB — LIPASE, BLOOD: Lipase: 956 U/L — ABNORMAL HIGH (ref 11–59)

## 2013-03-28 LAB — ETHANOL: Alcohol, Ethyl (B): <11 mg/dL (ref 0–11)

## 2013-03-28 LAB — I-STAT CG4 LACTIC ACID, ED: LACTIC ACID, VENOUS: 1.53 mmol/L (ref 0.5–2.2)

## 2013-03-28 LAB — MRSA PCR SCREENING: MRSA BY PCR: NEGATIVE

## 2013-03-28 MED ORDER — DIPHENHYDRAMINE HCL 50 MG/ML IJ SOLN: 12.5000 mg | Freq: Four times a day (QID) | INTRAMUSCULAR | Status: DC | PRN

## 2013-03-28 MED ORDER — THIAMINE HCL 100 MG/ML IJ SOLN
Freq: Once | INTRAVENOUS | Status: AC
  Administered 2013-03-28: 16:00:00 via INTRAVENOUS
  Filled 2013-03-28: qty 1000

## 2013-03-28 MED ORDER — ALUM & MAG HYDROXIDE-SIMETH 200-200-20 MG/5ML PO SUSP
30.0000 mL | Freq: Four times a day (QID) | ORAL | Status: DC | PRN
  Administered 2013-03-28 – 2013-03-31 (×3): 30 mL via ORAL
  Filled 2013-03-28 (×3): qty 30

## 2013-03-28 MED ORDER — SODIUM CHLORIDE 0.9 % IV BOLUS (SEPSIS)
1000.0000 mL | Freq: Once | INTRAVENOUS | Status: AC
  Administered 2013-03-28 (×2): 1000 mL via INTRAVENOUS

## 2013-03-28 MED ORDER — SODIUM CHLORIDE 0.9 % IV SOLN
INTRAVENOUS | Status: DC
  Administered 2013-03-28: 1000 mL via INTRAVENOUS
  Administered 2013-04-01: 20 mL via INTRAVENOUS

## 2013-03-28 MED ORDER — ONDANSETRON 8 MG/NS 50 ML IVPB
8.0000 mg | Freq: Four times a day (QID) | INTRAVENOUS | Status: DC | PRN
  Administered 2013-03-28: 8 mg via INTRAVENOUS
  Filled 2013-03-28: qty 8

## 2013-03-28 MED ORDER — ONDANSETRON HCL 4 MG/2ML IJ SOLN
4.0000 mg | Freq: Four times a day (QID) | INTRAMUSCULAR | Status: DC | PRN
  Administered 2013-03-28 – 2013-03-29 (×2): 4 mg via INTRAVENOUS
  Filled 2013-03-28 (×2): qty 2

## 2013-03-28 MED ORDER — DIPHENHYDRAMINE HCL 12.5 MG/5ML PO ELIX: 12.5000 mg | ORAL_SOLUTION | Freq: Four times a day (QID) | ORAL | Status: DC | PRN

## 2013-03-28 MED ORDER — NALOXONE HCL 0.4 MG/ML IJ SOLN: 0.4000 mg | INTRAMUSCULAR | Status: DC | PRN

## 2013-03-28 MED ORDER — ONDANSETRON 8 MG PO TBDP
8.0000 mg | ORAL_TABLET | Freq: Once | ORAL | Status: AC
  Administered 2013-03-28: 8 mg via ORAL
  Filled 2013-03-28: qty 1

## 2013-03-28 MED ORDER — ALBUTEROL SULFATE (2.5 MG/3ML) 0.083% IN NEBU
2.5000 mg | INHALATION_SOLUTION | Freq: Once | RESPIRATORY_TRACT | Status: AC
  Administered 2013-03-28: 2.5 mg via RESPIRATORY_TRACT
  Filled 2013-03-28: qty 3

## 2013-03-28 MED ORDER — HYDROMORPHONE HCL PF 1 MG/ML IJ SOLN
0.5000 mg | INTRAMUSCULAR | Status: AC | PRN
  Administered 2013-03-28: 0.5 mg via INTRAVENOUS
  Filled 2013-03-28: qty 1

## 2013-03-28 MED ORDER — PROMETHAZINE HCL 25 MG/ML IJ SOLN
25.0000 mg | Freq: Four times a day (QID) | INTRAMUSCULAR | Status: DC | PRN
  Administered 2013-03-28 – 2013-04-01 (×12): 25 mg via INTRAMUSCULAR
  Filled 2013-03-28 (×12): qty 1

## 2013-03-28 MED ORDER — HYDROMORPHONE HCL PF 1 MG/ML IJ SOLN
INTRAMUSCULAR | Status: AC
  Filled 2013-03-28: qty 1

## 2013-03-28 MED ORDER — ONDANSETRON HCL 4 MG PO TABS
4.0000 mg | ORAL_TABLET | Freq: Four times a day (QID) | ORAL | Status: DC | PRN
  Administered 2013-03-29 – 2013-03-31 (×6): 4 mg via ORAL
  Filled 2013-03-28 (×6): qty 1

## 2013-03-28 MED ORDER — LORAZEPAM 2 MG/ML IJ SOLN
2.0000 mg | INTRAMUSCULAR | Status: DC | PRN
  Administered 2013-03-28 – 2013-03-29 (×10): 2 mg via INTRAVENOUS
  Administered 2013-03-29: 3 mg via INTRAVENOUS
  Administered 2013-03-29 – 2013-04-01 (×19): 2 mg via INTRAVENOUS
  Filled 2013-03-28 (×29): qty 1
  Filled 2013-03-28: qty 2

## 2013-03-28 MED ORDER — HYDROMORPHONE HCL PF 1 MG/ML IJ SOLN
0.5000 mg | Freq: Once | INTRAMUSCULAR | Status: AC
  Administered 2013-03-28: 0.5 mg via INTRAMUSCULAR
  Filled 2013-03-28: qty 1

## 2013-03-28 MED ORDER — FENTANYL CITRATE 0.05 MG/ML IJ SOLN: 50.0000 ug | Freq: Once | INTRAMUSCULAR | Status: DC

## 2013-03-28 MED ORDER — HYDROMORPHONE HCL PF 1 MG/ML IJ SOLN
0.5000 mg | INTRAMUSCULAR | Status: AC | PRN
  Administered 2013-03-28 (×3): 0.5 mg via INTRAVENOUS
  Filled 2013-03-28 (×3): qty 1

## 2013-03-28 MED ORDER — HYDROMORPHONE 0.3 MG/ML IV SOLN
INTRAVENOUS | Status: DC
  Administered 2013-03-28: 25 mL via INTRAVENOUS
  Administered 2013-03-28: 5.6 mg via INTRAVENOUS
  Administered 2013-03-29: 1.34 mg via INTRAVENOUS
  Administered 2013-03-29: 3.6 mg via INTRAVENOUS
  Filled 2013-03-28 (×2): qty 25

## 2013-03-28 MED ORDER — LORAZEPAM 1 MG PO TABS
0.0000 mg | ORAL_TABLET | Freq: Four times a day (QID) | ORAL | Status: AC
  Administered 2013-03-28: 2 mg via ORAL
  Administered 2013-03-29: 1 mg via ORAL
  Administered 2013-03-29 (×2): 2 mg via ORAL
  Filled 2013-03-28 (×2): qty 2
  Filled 2013-03-28: qty 1
  Filled 2013-03-28: qty 2

## 2013-03-28 MED ORDER — ENOXAPARIN SODIUM 40 MG/0.4ML ~~LOC~~ SOLN
40.0000 mg | SUBCUTANEOUS | Status: DC
  Administered 2013-03-28 – 2013-03-29 (×2): 40 mg via SUBCUTANEOUS
  Filled 2013-03-28 (×3): qty 0.4

## 2013-03-28 MED ORDER — HYDROMORPHONE HCL PF 2 MG/ML IJ SOLN
2.0000 mg | Freq: Once | INTRAMUSCULAR | Status: AC
  Administered 2013-03-28: 2 mg via INTRAMUSCULAR
  Filled 2013-03-28: qty 1

## 2013-03-28 MED ORDER — SODIUM CHLORIDE 0.9 % IJ SOLN
3.0000 mL | Freq: Two times a day (BID) | INTRAMUSCULAR | Status: DC
  Administered 2013-03-28: 3 mL via INTRAVENOUS
  Administered 2013-03-29: 22:00:00 via INTRAVENOUS
  Administered 2013-03-30: 3 mL via INTRAVENOUS

## 2013-03-28 MED ORDER — SODIUM CHLORIDE 0.9 % IJ SOLN
9.0000 mL | INTRAMUSCULAR | Status: DC | PRN
  Administered 2013-03-28: 9 mL via INTRAVENOUS

## 2013-03-28 NOTE — ED Notes (Signed)
Attempted to start IV x 4 without success

## 2013-03-28 NOTE — ED Notes (Signed)
Hospitalist at bedside at this time 

## 2013-03-28 NOTE — ED Provider Notes (Signed)
CSN: [id number]     Arrival date & time 03/27/13  2354 History   First MD Initiated Contact with Patient 03/28/13 0149     No chief complaint on file.    (Consider location/radiation/quality/duration/timing/severity/associated sxs/prior Treatment) HPI 63 year old female presents to the emergency department with complaint of pancreatitis flare.  Patient reports she has had 2 days of nausea and vomiting, despite taking her Phenergan and Zofran.  She's been drinking alcohol to help with her pain.  Also, taking 4 mg of Dilaudid at a time.  She, reports she's been vomiting 3-4 times a day.  Patient also reports increasing shortness of breath.  Over the last 4 days.  She's been out of her albuterol inhaler.  Patient is smoking a half pack a day.  She is on 3 L oxygen chronically for COPD.  She, reports she's had increased sputum.  She reports that she is having bilateral rib pain from coughing.  Patient has significant history of pancreatitis, COPD, substance abuse, alcohol withdrawal seizures.  She reports her last drink was yesterday. Past Medical History  Diagnosis Date  . Pancreatitis   . COPD (chronic obstructive pulmonary disease)   . Substance abuse   . Cancer     renal ca  . Seizures   . Pancreatitis    Past Surgical History  Procedure Laterality Date  . Partial gastrectomy     Family History  Problem Relation Age of Onset  . CAD Mother   . Alcoholism Father    History  Substance Use Topics  . Smoking status: Current Every Day Smoker -- 0.50 packs/day    Types: Cigarettes  . Smokeless tobacco: Never Used  . Alcohol Use: Yes     Comment: Hx of Alcohol abuse, reports she last drink was this AM, 24 oz X2 of beer per day   OB History   Grav Para Term Preterm Abortions TAB SAB Ect Mult Living                 Review of Systems  See History of Present Illness; otherwise all other systems are reviewed and negative   Allergies  Aspirin and Chlorpromazine hcl  Home  Medications  No current outpatient prescriptions on file. BP 173/136  Pulse 117  Temp(Src) 98.4 F (36.9 C) (Oral)  Resp 12  SpO2 100% Physical Exam  Nursing note and vitals reviewed. Constitutional: She is oriented to person, place, and time. She appears distressed.  Chronically ill-appearing, older than stated age, female, who is emaciated  HENT:  Head: Normocephalic and atraumatic.  Neck: Normal range of motion. No JVD present. No tracheal deviation present. No thyromegaly present.  Cardiovascular: Regular rhythm, normal heart sounds and intact distal pulses.  Exam reveals no gallop and no friction rub.   No murmur heard. Tachycardia  Pulmonary/Chest: No stridor. No respiratory distress. She has wheezes. She has no rales. She exhibits no tenderness.  Abdominal: Soft. There is tenderness (diffuse tenderness).  Hyperactive bowel sounds  Musculoskeletal: Normal range of motion. She exhibits no edema and no tenderness.  Lymphadenopathy:    She has no cervical adenopathy.  Neurological: She is alert and oriented to person, place, and time.  Skin: Skin is warm and dry. No rash noted. No erythema. No pallor.    ED Course  Procedures (including critical care time) Labs Review Labs Reviewed  CBC WITH DIFFERENTIAL - Abnormal; Notable for the following:    WBC 12.3 (*)    Hemoglobin 15.9 (*)    Neutrophils  Relative % 84 (*)    Neutro Abs 10.4 (*)    Lymphocytes Relative 6 (*)    Monocytes Absolute 1.1 (*)    All other components within normal limits  COMPREHENSIVE METABOLIC PANEL - Abnormal; Notable for the following:    Sodium 136 (*)    Potassium 3.5 (*)    Chloride 91 (*)    Glucose, Bld 115 (*)    Calcium 10.9 (*)    Total Protein 8.6 (*)    AST 51 (*)    Alkaline Phosphatase 149 (*)    All other components within normal limits  LIPASE, BLOOD - Abnormal; Notable for the following:    Lipase 956 (*)    All other components within normal limits  ETHANOL  I-STAT CG4  LACTIC ACID, ED   Imaging Review Dg Chest 2 View  03/28/2013   CLINICAL DATA:  Shortness of breath and chest pain for the past week.  EXAM: CHEST  2 VIEW  COMPARISON:  Chest x-ray 03/09/2013.  FINDINGS: No acute consolidative airspace disease. No pleural effusions. Nausea its of pulmonary edema. Heart size is normal. Upper mediastinal contours are within normal limits. Atherosclerosis in the thoracic aorta.  IMPRESSION: 1. No radiographic evidence of acute cardiopulmonary disease. 2. Atherosclerosis.   Electronically Signed   By: Vinnie Langton M.D.   On: 03/28/2013 03:51   US Abdomen Limited  03/28/2013   CLINICAL DATA:  Pancreatitis, abdominal pain  EXAM: US ABDOMEN LIMITED - RIGHT UPPER QUADRANT  COMPARISON:  DG ABD PORTABLE 1V dated 03/10/2013; CT ABD/PELVIS W CM dated 02/09/2013  FINDINGS: Gallbladder:  No gallbladder wall thickening or pericholecystic fluid. No echogenic gallstones. Negative sonographic Murphy's sign.  Common bile duct:  Diameter: Common bile duct is dilated to 9 to 12 mm. No obstructing lesion identified.  Liver:  Liver is increased in echogenicity.  At the root of dilatation.  IMPRESSION: 1. Dilatation of the common bile duct. If patient has elevated liver function tests (bilirubin) consider for MRCP further evaluation.  2.  Echogenic liver consistent with given history of cirrhosis   Electronically Signed   By: Suzy Bouchard M.D.   On: 03/28/2013 07:10     EKG Interpretation None      MDM   Final diagnoses:  Pancreatitis  Tobacco abuse  Alcoholism   63 year old female with acute on chronic abdominal pain, and mild COPD exacerbation.  Plan for chest x-ray, and no treatment for wheezing.  Patient is a very difficult stick, but after multiple attempts.  We have been able to obtain blood.  At this time.  She still does not have IV access despite multiple attempts.  Patient received IM medication, and will try for a foot IV.  Labs reviewed, patient again has  pancreatitis.  Plan for admission to the hospital.  She will need to CIWA, given her history of alcoholism.    Kalman Drape, MD 03/28/13 [phone]

## 2013-03-28 NOTE — Progress Notes (Signed)
Patient seen and examined, database reviewed. Admitted earlier today with acute pancreatitis. As she remains symptomatic, maintain NPO today, treat supportively. Will continue to follow. Can transfer to floor.  Domingo Mend, MD [facility] Pager: [phone]

## 2013-03-28 NOTE — Progress Notes (Signed)
Order for PICC placement by IV team. Jonne Ply RN notified that IV Team does not place her PICC's and  Interventional Radiology places [facility] for this patient.

## 2013-03-28 NOTE — H&P (Addendum)
[facility] History and Physical  Patient: [name]  DZH:[id number]  DOB: 1950-11-15  DOS: the patient was seen and examined on 03/28/2013 PCP: Ricke Hey, MD  Chief Complaint: Abdominal pain  HPI: [name] is a 63 y.o. female with Past medical history of chronic pancreatitis, alcohol abuse, COPD, anxiety disorder, substance abuse. The patient is coming from home. The patient presented with complaints of abdominal pain nausea vomiting and diarrhea. She woke up in the morning with 6-7 episodes of loose watery bowel movement without any blood but then improved during the day. Late in the night she had 1 beer and then she went to bed. She woke up with complaints of nausea and multiple vomiting without any blood in severe abdominal pain. She has chronic abdominal pain which is sharp in nature and located diffuse leg primarily in the upper abdomen and it is the same pain that she always has but more severe intensity at the time of my evaluation as per the patient. She denies any fever or chills she denies any cough or chest pain denies any shortness of breath. She complains of some palpitations and tremors. She complains of anxiety as well. She denies any focal neurological deficit. She mentions that her last drink was today but before that she hasn't had a drink since at least last 4 days.  Review of Systems: as mentioned in the history of present illness.  A Comprehensive review of the other systems is negative.  Past Medical History  Diagnosis Date  . Pancreatitis   . COPD (chronic obstructive pulmonary disease)   . Substance abuse   . Cancer     renal ca  . Seizures   . Pancreatitis    Past Surgical History  Procedure Laterality Date  . Partial gastrectomy     Social History:  reports that she has been smoking Cigarettes.  She has been smoking about 0.50 packs per day. She has never used smokeless tobacco. She reports that she drinks alcohol. She reports  that she uses illicit drugs. Independent for most of her  ADL.  Allergies  Allergen Reactions  . Aspirin Other (See Comments)    bleeding  . Chlorpromazine Hcl Other (See Comments)    Muscle spasms    Family History  Problem Relation Age of Onset  . CAD Mother   . Alcoholism Father     Prior to Admission medications   Medication Sig Start Date End Date Taking? Authorizing Provider  fluticasone (FLOVENT HFA) 110 MCG/ACT inhaler Inhale 2 puffs into the lungs 2 (two) times daily.   Yes Historical Provider, MD  HYDROmorphone (DILAUDID) 4 MG tablet Take 4 mg by mouth every 4 (four) hours as needed for severe pain.   Yes Historical Provider, MD  LORazepam (ATIVAN) 1 MG tablet Take 1 tablet (1 mg total) by mouth every 6 (six) hours as needed. For anxiety. 04/07/11  Yes Joaquim Lai C. Sanford, PA-C  ondansetron (ZOFRAN) 8 MG tablet Take 8 mg by mouth every 8 (eight) hours as needed for nausea or vomiting.    Historical Provider, MD    Physical Exam: Filed Vitals:   03/28/13 0000 03/28/13 0410 03/28/13 0517 03/28/13 0531  BP:   173/102 173/102  Pulse:   120 120  Temp: 98.4 F (36.9 C)     TempSrc: Oral     SpO2: 100% 100% 100%     General: Alert, Awake and Oriented to Time, Place and Person. Appear in severe distress Eyes: PERRL ENT:  Oral Mucosa clear dry. Neck: No JVD Cardiovascular: S1 and S2 Present, no Murmur, Peripheral Pulses Present Respiratory: Bilateral Air entry equal and Decreased, Clear to Auscultation,  No Crackles, no wheezes Abdomen: Bowel Sound Present, Soft and diffuse moderately tender, no guarding no rigidity Skin: No Rash Extremities: No Pedal edema, no calf tenderness Neurologic: Grossly Unremarkable.  Labs on Admission:  CBC:  Recent Labs Lab 03/28/13 0338  WBC 12.3*  NEUTROABS 10.4*  HGB 15.9*  HCT 45.6  MCV 90.5  PLT 209    CMP     Component Value Date/Time   NA 136* 03/28/2013 0338   K 3.5* 03/28/2013 0338   CL 91* 03/28/2013 0338   CO2 26  03/28/2013 0338   GLUCOSE 115* 03/28/2013 0338   BUN 8 03/28/2013 0338   CREATININE 0.56 03/28/2013 0338   CALCIUM 10.9* 03/28/2013 0338   PROT 8.6* 03/28/2013 0338   ALBUMIN 4.3 03/28/2013 0338   AST 51* 03/28/2013 0338   ALT 21 03/28/2013 0338   ALKPHOS 149* 03/28/2013 0338   BILITOT 1.0 03/28/2013 0338   GFRNONAA >90 03/28/2013 0338   GFRAA >90 03/28/2013 0338     Recent Labs Lab 03/28/13 0338  LIPASE 956*   No results found for this basename: AMMONIA,  in the last 168 hours  No results found for this basename: CKTOTAL, CKMB, CKMBINDEX, TROPONINI,  in the last 168 hours BNP (last 3 results) No results found for this basename: PROBNP,  in the last 8760 hours  Radiological Exams on Admission: Dg Chest 2 View  03/28/2013   CLINICAL DATA:  Shortness of breath and chest pain for the past week.  EXAM: CHEST  2 VIEW  COMPARISON:  Chest x-ray 03/09/2013.  FINDINGS: No acute consolidative airspace disease. No pleural effusions. Nausea its of pulmonary edema. Heart size is normal. Upper mediastinal contours are within normal limits. Atherosclerosis in the thoracic aorta.  IMPRESSION: 1. No radiographic evidence of acute cardiopulmonary disease. 2. Atherosclerosis.   Electronically Signed   By: Vinnie Langton M.D.   On: 03/28/2013 03:51    EKG: Independently reviewed. sinus tachycardia.  Assessment/Plan Principal Problem:   Recurrent acute pancreatitis Active Problems:   ANXIETY DISORDER   Nausea and vomiting   Alcohol withdrawal   1. Recurrent acute pancreatitis The patient is presenting with complaints of abdominal pain. She has elevated lipase on blood work. She has evidence of hemoconcentration with elevated total protein, hemoglobin, leukocytosis suggesting dehydration. She has poor peripheral IV access. At present patient will be admitted to the step down unit. She will be started on banana bag with IV 75 cc per hour, multivitamins will be given, IV pain management with IV Dilaudid  initially bolus and will be switched to later on Dilaudid PCA needed IV Zofran and IV Phenergan as needed for nausea. I will keep her n.p.o.  Patient will be monitored by PCCM, and if needed a central line access will be obtained. PICC line ordered.  2. Sinus tachycardia Likely combination of withdrawal and acute pain, management of both will improve her symptoms.  3. Anxiety disorder Patient is on IV Ativan at present  DVT Prophylaxis: subcutaneous Heparin nutrition: npo  Code Status: full  Disposition: Admitted to inpatient in step-down unit.  Author: Berle Mull, MD [facility] Pager: [phone] 03/28/2013, 5:54 AM    If 7PM-7AM, please contact night-coverage [email or web address] Password [email or web address]

## 2013-03-28 NOTE — Progress Notes (Signed)
INITIAL NUTRITION ASSESSMENT  Pt meets criteria for severe MALNUTRITION in the context of chronic illness as evidenced by 11.4% weight loss with <75% estimated energy intake in the past month in addition to pt with severe muscle and subcutaneous fat wasting in multiple body regions.   DOCUMENTATION CODES Per approved criteria  -Severe malnutrition in the context of chronic illness -Underweight   INTERVENTION: - Diet advancement per MD - Once diet advanced to full liquid/low fat diet, recommend daily monitoring x 3 days of pt's potassium, magnesium, and phosphorus with repletion as pt at high risk of refeeding syndrome r/t minimal intake in the past month  - Will continue to monitor   NUTRITION DIAGNOSIS: Inadequate oral intake related to inability to eat as evidenced by NPO.   Goal: Advance diet as tolerated to low fat diet  Monitor:  Weights, labs, diet advancement, nausea, vomiting, diarrhea  Reason for Assessment: Malnutrition screening tool, underweight  63 y.o. female  Admitting Dx: Recurrent acute pancreatitis  ASSESSMENT: Patient with history of ETOH abuse, chronic pancreatitis, recently admitted 03/09/2013 for acute alcoholic pancreatitis, readmitted with nausea and persistent abdominal pain, before then admitted for same complaint 02/09/13.   She woke up in the morning with 6-7 episodes of loose watery bowel movement without any blood but then improved during the day. Late in the night she had 1 beer and then she went to bed. She woke up with complaints of nausea and multiple vomiting without any blood in severe abdominal pain. She has chronic abdominal pain which is sharp in nature and located diffuse leg primarily in the upper abdomen and it is the same pain that she always has but more severe intensity. She told MD that her last drink was today but before that she hasn't had a drink since at least last 4 days. Patient reports she has had 2 days of nausea and vomiting, despite  taking her Phenergan and Zofran. She, reports she's been vomiting 3-4 times a day.   Attempted to meet with pt however pt requested I leave as she wasn't feeling well but wanted me to ask RN if it was time for her medication. Pt seen by inpatient RD during admission earlier this month who noted pt had minimal PO intake for the last 2 weeks due to abdominal pain. Anything she ate made her nauseous. Was continuing to drink 2, 24 oz beers daily. Patient has always been small framed, with a usual body weight of 90-95 pounds, now weighs 78 pounds. Visibly emaciated.   Potassium low, getting banana bag that includes multivitamins  Lipase elevated AST elevated   Height: Ht Readings from Last 1 Encounters:  03/28/13 5\' 4"  (1.626 m)    Weight: Wt Readings from Last 1 Encounters:  03/28/13 78 lb 11.3 oz (35.7 kg)    Ideal Body Weight: 120 lbs  % Ideal Body Weight: 65%  Wt Readings from Last 10 Encounters:  03/28/13 78 lb 11.3 oz (35.7 kg)  03/11/13 88 lb 8 oz (40.143 kg)  02/09/13 92 lb 9.5 oz (42 kg)  07/14/11 88 lb (39.917 kg)  02/25/11 203 lb 11.3 oz (92.4 kg)  12/02/10 145 lb 3 oz (65.857 kg)  08/02/10 80 lb (36.288 kg)  04/08/10 82 lb (37.195 kg)  10/18/08 95 lb 6.4 oz (43.273 kg)    Usual Body Weight: 90-95 lbs  % Usual Body Weight: 82-87%  BMI:  Body mass index is 13.5 kg/(m^2). Underweight  Estimated Nutritional Needs: Kcal: 1500-1700 Protein: 70-80g Fluid: 1.5-1.7L/day  Skin: Intact  Diet Order: NPO  EDUCATION NEEDS: -No education needs identified at this time  No intake or output data in the 24 hours ending 03/28/13 0952  Last BM: PTA  Labs:   Recent Labs Lab 03/28/13 0338  NA 136*  K 3.5*  CL 91*  CO2 26  BUN 8  CREATININE 0.56  CALCIUM 10.9*  GLUCOSE 115*    CBG (last 3)  No results found for this basename: GLUCAP,  in the last 72 hours  Scheduled Meds: . enoxaparin (LOVENOX) injection  40 mg Subcutaneous Q24H  . HYDROmorphone PCA 0.3  mg/mL   Intravenous 6 times per day  . LORazepam  0-4 mg Oral 4 times per day  . banana bag IV 1000 mL   Intravenous Once  . sodium chloride  3 mL Intravenous Q12H    Continuous Infusions:   Past Medical History  Diagnosis Date  . Pancreatitis   . COPD (chronic obstructive pulmonary disease)   . Substance abuse   . Cancer     renal ca  . Seizures   . Pancreatitis     Past Surgical History  Procedure Laterality Date  . Partial gastrectomy      Mikey College MS, RD, [place] Pager [phone] After Hours Pager

## 2013-03-28 NOTE — Procedures (Signed)
LUE PICC

## 2013-03-29 LAB — CBC
HEMATOCRIT: 34.8 % — AB (ref 36.0–46.0)
HEMOGLOBIN: 11.3 g/dL — AB (ref 12.0–15.0)
MCH: 30.5 pg (ref 26.0–34.0)
MCHC: 32.5 g/dL (ref 30.0–36.0)
MCV: 94.1 fL (ref 78.0–100.0)
PLATELETS: 104 10*3/uL — AB (ref 150–400)
RBC: 3.7 MIL/uL — AB (ref 3.87–5.11)
RDW: 14.6 % (ref 11.5–15.5)
WBC: 4.8 10*3/uL (ref 4.0–10.5)

## 2013-03-29 LAB — BASIC METABOLIC PANEL
BUN: 10 mg/dL (ref 6–23)
CALCIUM: 8.7 mg/dL (ref 8.4–10.5)
CHLORIDE: 103 meq/L (ref 96–112)
CO2: 25 meq/L (ref 19–32)
Creatinine, Ser: 0.57 mg/dL (ref 0.50–1.10)
GFR calc Af Amer: >90 mL/min (ref >90)
GFR calc non Af Amer: >90 mL/min (ref >90)
GLUCOSE: 62 mg/dL — AB (ref 70–99)
Potassium: 3.2 mEq/L — ABNORMAL LOW (ref 3.7–5.3)
SODIUM: 140 meq/L (ref 137–147)

## 2013-03-29 MED ORDER — OXYCODONE HCL 5 MG PO TABS: 5.0000 mg | ORAL_TABLET | ORAL | Status: DC | PRN

## 2013-03-29 MED ORDER — SODIUM CHLORIDE 0.9 % IJ SOLN
10.0000 mL | INTRAMUSCULAR | Status: DC | PRN
  Administered 2013-03-29 – 2013-04-01 (×5): 10 mL

## 2013-03-29 MED ORDER — NICOTINE 14 MG/24HR TD PT24
14.0000 mg | MEDICATED_PATCH | Freq: Every day | TRANSDERMAL | Status: DC
  Administered 2013-03-29 – 2013-03-31 (×3): 14 mg via TRANSDERMAL
  Filled 2013-03-29 (×5): qty 1

## 2013-03-29 MED ORDER — HYDROMORPHONE HCL 4 MG PO TABS
4.0000 mg | ORAL_TABLET | ORAL | Status: DC | PRN
  Administered 2013-03-29 – 2013-04-01 (×11): 4 mg via ORAL
  Filled 2013-03-29 (×3): qty 1
  Filled 2013-03-29: qty 2
  Filled 2013-03-29 (×2): qty 1
  Filled 2013-03-29: qty 2
  Filled 2013-03-29: qty 1
  Filled 2013-03-29 (×2): qty 2
  Filled 2013-03-29: qty 1

## 2013-03-29 MED ORDER — HYDROMORPHONE HCL PF 1 MG/ML IJ SOLN
1.0000 mg | INTRAMUSCULAR | Status: DC | PRN
  Administered 2013-03-29 – 2013-03-30 (×4): 1 mg via INTRAVENOUS
  Filled 2013-03-29 (×5): qty 1

## 2013-03-29 MED ORDER — SODIUM CHLORIDE 0.9 % IJ SOLN
10.0000 mL | Freq: Two times a day (BID) | INTRAMUSCULAR | Status: DC
Start: 2013-03-29 — End: 2013-04-01
  Administered 2013-03-29 – 2013-04-01 (×3): 10 mL

## 2013-03-29 NOTE — Clinical Documentation Improvement (Signed)
INITIAL NUTRITION ASSESSMENT  03/28/2013  Possible Clinical Conditions?  Severe Malnutrition   Protein Calorie Malnutrition Severe Protein Calorie Malnutrition Emaciation  Cachexia    Other Condition Cannot clinically determine Pt meets criteria for severe MALNUTRITION in the context of chronic illness as evidenced by 11.4% weight loss with <75% estimated energy intake in the past month in addition to pt with severe muscle and subcutaneous fat wasting in multiple body regions  Supporting Information: Risk Factors:history of ETOH abuse, chronic pancreatitis,nausea and persistent abdominal pain,    Signs & Symptoms:small framed, with a usual body weight of 90-95 pounds, now weighs 78 pounds. Visibly emaciated.   Diagnostics: Ht: 5'4" Wt: 78  BMI=13.5  Treatment:INTERVENTION:  - Diet advancement per MD  - Once diet advanced to full liquid/low fat diet, recommend daily monitoring x 3 days of pt's potassium, magnesium, and phosphorus with repletion as pt at high risk of refeeding syndrome r/t minimal intake in the past month  Monitor:  Weights, labs, diet advancement, nausea, vomiting, diarrhea      Thank You, Philippa Chester ,RN Clinical Documentation Specialist:  [facility]

## 2013-03-29 NOTE — Progress Notes (Signed)
[facility] PROGRESS NOTE  [name] TKW:[id number] DOB: 1950-08-26 DOA: 03/27/2013 PCP: Ricke Hey, MD  Assessment/Plan: Acute Pancreatitis -Pain improved. -Wants to eat. -Will advance diet to clears. -Take off PCA dilaudid and use PO dilaudid for pain control.  ETOH Abuse/Withdrawals -Continue to monitor on CIWA protocol. -Thiamine/folate.  Severe Protein-Caloric Malnutrition -Appreciate nutrition consultation. -Continue to advance diet.   Code Status: Full Code Family Communication: Patient only  Disposition Plan: Home when ready   Consultants:  None   Antibiotics:  None   Subjective: Pain is improved. Wants to eat. Feels shaky and anxious.  Objective: Filed Vitals:   03/29/13 0700 03/29/13 0731 03/29/13 0800 03/29/13 1028  BP: 143/91   148/93  Pulse: 57   91  Temp: 97.3 F (36.3 C)   97.5 F (36.4 C)  TempSrc: Oral   Oral  Resp: 8  20 8   Height:      Weight:  38.783 kg (85 lb 8 oz)    SpO2: 100%  99% 97%    Intake/Output Summary (Last 24 hours) at 03/29/13 1427 Last data filed at 03/29/13 1032  Gross per 24 hour  Intake   1145 ml  Output    500 ml  Net    645 ml   Filed Weights   03/28/13 0800 03/28/13 1942 03/29/13 0731  Weight: 35.7 kg (78 lb 11.3 oz) 35.7 kg (78 lb 11.3 oz) 38.783 kg (85 lb 8 oz)    Exam:   General:  AA Ox3  Cardiovascular: RRR  Respiratory: CTA B  Abdomen: S/NT/ND/+BS   Extremities: no C/C/E   Neurologic:  Non-focal  Data Reviewed: Basic Metabolic Panel:  Recent Labs Lab 03/28/13 0338 03/29/13 0535  NA 136* 140  K 3.5* 3.2*  CL 91* 103  CO2 26 25  GLUCOSE 115* 62*  BUN 8 10  CREATININE 0.56 0.57  CALCIUM 10.9* 8.7   Liver Function Tests:  Recent Labs Lab 03/28/13 0338  AST 51*  ALT 21  ALKPHOS 149*  BILITOT 1.0  PROT 8.6*  ALBUMIN 4.3    Recent Labs Lab 03/28/13 0338  LIPASE 956*   No results found for this basename: AMMONIA,  in the last 168  hours CBC:  Recent Labs Lab 03/28/13 0338 03/29/13 0535  WBC 12.3* 4.8  NEUTROABS 10.4*  --   HGB 15.9* 11.3*  HCT 45.6 34.8*  MCV 90.5 94.1  PLT 209 104*   Cardiac Enzymes: No results found for this basename: CKTOTAL, CKMB, CKMBINDEX, TROPONINI,  in the last 168 hours BNP (last 3 results) No results found for this basename: PROBNP,  in the last 8760 hours CBG: No results found for this basename: GLUCAP,  in the last 168 hours  Recent Results (from the past 240 hour(s))  MRSA PCR SCREENING     Status: None   Collection Time    03/28/13  8:12 AM      Result Value Ref Range Status   MRSA by PCR NEGATIVE  NEGATIVE Final   Comment:            The GeneXpert MRSA Assay (FDA     approved for NASAL specimens     only), is one component of a     comprehensive MRSA colonization     surveillance program. It is not     intended to diagnose MRSA     infection nor to guide or     monitor treatment for     MRSA infections.  Studies: Dg Chest 2 View  03/28/2013   CLINICAL DATA:  Shortness of breath and chest pain for the past week.  EXAM: CHEST  2 VIEW  COMPARISON:  Chest x-ray 03/09/2013.  FINDINGS: No acute consolidative airspace disease. No pleural effusions. Nausea its of pulmonary edema. Heart size is normal. Upper mediastinal contours are within normal limits. Atherosclerosis in the thoracic aorta.  IMPRESSION: 1. No radiographic evidence of acute cardiopulmonary disease. 2. Atherosclerosis.   Electronically Signed   By: Vinnie Langton M.D.   On: 03/28/2013 03:51   US Abdomen Limited  03/28/2013   CLINICAL DATA:  Pancreatitis, abdominal pain  EXAM: US ABDOMEN LIMITED - RIGHT UPPER QUADRANT  COMPARISON:  DG ABD PORTABLE 1V dated 03/10/2013; CT ABD/PELVIS W CM dated 02/09/2013  FINDINGS: Gallbladder:  No gallbladder wall thickening or pericholecystic fluid. No echogenic gallstones. Negative sonographic Murphy's sign.  Common bile duct:  Diameter: Common bile duct is dilated to 9 to  12 mm. No obstructing lesion identified.  Liver:  Liver is increased in echogenicity.  At the root of dilatation.  IMPRESSION: 1. Dilatation of the common bile duct. If patient has elevated liver function tests (bilirubin) consider for MRCP further evaluation.  2.  Echogenic liver consistent with given history of cirrhosis   Electronically Signed   By: Suzy Bouchard M.D.   On: 03/28/2013 07:10   Ir Fluoro Guide Cv Line Left  03/28/2013   CLINICAL DATA:  Pancreatitis  EXAM: Left upper extremity PICC LINE PLACEMENT WITH ULTRASOUND AND FLUOROSCOPIC GUIDANCE  FLUOROSCOPY TIME:  12 seconds.  PROCEDURE: The patient was advised of the possible risks andcomplications and agreed to undergo the procedure. The patient was then brought to the angiographic suite for the procedure.  The left arm was prepped with chlorhexidine, drapedin the usual sterile fashion using maximum barrier technique (cap and mask, sterile gown, sterile gloves, large sterile sheet, hand hygiene and cutaneous antisepsis) and infiltrated locally with 1% Lidocaine.  Ultrasound demonstrated patency of the left basilic vein, and this was documented with an image. Under real-time ultrasound guidance, this vein was accessed with a 21 gauge micropuncture needle and image documentation was performed. A 0.018 wire was introduced in to the vein. Over this, a 5 [place] double lumen Power PICC was advanced to the lower SVC/right atrial junction. Fluoroscopy during the procedure and fluoro spot radiograph confirms appropriate catheter position. The catheter was flushed and covered with asterile dressing.  Complications: None  IMPRESSION: Successful left arm Power PICC line placement with ultrasound and fluoroscopic guidance. The catheter is ready for use.   Electronically Signed   By: Maryclare Bean M.D.   On: 03/28/2013 16:59   Ir US Guide Vasc Access Right  03/28/2013   CLINICAL DATA:  Pancreatitis  EXAM: Left upper extremity PICC LINE PLACEMENT WITH ULTRASOUND  AND FLUOROSCOPIC GUIDANCE  FLUOROSCOPY TIME:  12 seconds.  PROCEDURE: The patient was advised of the possible risks andcomplications and agreed to undergo the procedure. The patient was then brought to the angiographic suite for the procedure.  The left arm was prepped with chlorhexidine, drapedin the usual sterile fashion using maximum barrier technique (cap and mask, sterile gown, sterile gloves, large sterile sheet, hand hygiene and cutaneous antisepsis) and infiltrated locally with 1% Lidocaine.  Ultrasound demonstrated patency of the left basilic vein, and this was documented with an image. Under real-time ultrasound guidance, this vein was accessed with a 21 gauge micropuncture needle and image documentation was performed. A 0.018 wire was introduced  in to the vein. Over this, a 5 [place] double lumen Power PICC was advanced to the lower SVC/right atrial junction. Fluoroscopy during the procedure and fluoro spot radiograph confirms appropriate catheter position. The catheter was flushed and covered with asterile dressing.  Complications: None  IMPRESSION: Successful left arm Power PICC line placement with ultrasound and fluoroscopic guidance. The catheter is ready for use.   Electronically Signed   By: Maryclare Bean M.D.   On: 03/28/2013 16:59    Scheduled Meds: . enoxaparin (LOVENOX) injection  40 mg Subcutaneous Q24H  . LORazepam  0-4 mg Oral 4 times per day  . nicotine  14 mg Transdermal Daily  . sodium chloride  10-40 mL Intracatheter Q12H  . sodium chloride  3 mL Intravenous Q12H   Continuous Infusions: . sodium chloride 1,000 mL (03/28/13 1624)    Principal Problem:   Recurrent acute pancreatitis Active Problems:   ANXIETY DISORDER   Nausea and vomiting   Protein-calorie malnutrition, severe   Alcohol withdrawal    Time spent: 35 minutes. Greater than 50% of this time was spent in direct contact with the patient coordinating care.    Lelon Frohlich  [facility] Pager [phone]  If 7PM-7AM, please contact night-coverage at [email or web address], password [email or web address] 03/29/2013, 2:27 PM  LOS: 2 days

## 2013-03-29 NOTE — Progress Notes (Signed)
Received referral for [facility] Care Management services from COPD [facility] program. Patient not eligible for [facility] Care Management as [place] Management not contracted with Medicaid at this time. Made referral to [facility] by leaving voicemail. Made inpatient RN case manager aware.  Marthenia Rolling, MSN- [facility] [phone]

## 2013-03-30 LAB — COMPREHENSIVE METABOLIC PANEL
ALBUMIN: 2.5 g/dL — AB (ref 3.5–5.2)
ALK PHOS: 80 U/L (ref 39–117)
ALT: 20 U/L (ref 0–35)
AST: 38 U/L — AB (ref 0–37)
BUN: 7 mg/dL (ref 6–23)
CHLORIDE: 106 meq/L (ref 96–112)
CO2: 27 mEq/L (ref 19–32)
Calcium: 8.7 mg/dL (ref 8.4–10.5)
Creatinine, Ser: 0.57 mg/dL (ref 0.50–1.10)
GFR calc Af Amer: >90 mL/min (ref >90)
GFR calc non Af Amer: >90 mL/min (ref >90)
Glucose, Bld: 88 mg/dL (ref 70–99)
Potassium: 2.8 mEq/L — CL (ref 3.7–5.3)
SODIUM: 143 meq/L (ref 137–147)
TOTAL PROTEIN: 5.4 g/dL — AB (ref 6.0–8.3)
Total Bilirubin: 0.4 mg/dL (ref 0.3–1.2)

## 2013-03-30 LAB — CBC
HEMATOCRIT: 34.7 % — AB (ref 36.0–46.0)
Hemoglobin: 11.5 g/dL — ABNORMAL LOW (ref 12.0–15.0)
MCH: 30.4 pg (ref 26.0–34.0)
MCHC: 33.1 g/dL (ref 30.0–36.0)
MCV: 91.8 fL (ref 78.0–100.0)
PLATELETS: 97 10*3/uL — AB (ref 150–400)
RBC: 3.78 MIL/uL — ABNORMAL LOW (ref 3.87–5.11)
RDW: 14.1 % (ref 11.5–15.5)
WBC: 5.1 10*3/uL (ref 4.0–10.5)

## 2013-03-30 LAB — MAGNESIUM: Magnesium: 1.7 mg/dL (ref 1.5–2.5)

## 2013-03-30 MED ORDER — ALUM & MAG HYDROXIDE-SIMETH 200-200-20 MG/5ML PO SUSP: 30.0000 mL | Freq: Four times a day (QID) | ORAL | Status: DC | PRN

## 2013-03-30 MED ORDER — ALBUTEROL SULFATE (2.5 MG/3ML) 0.083% IN NEBU
2.5000 mg | INHALATION_SOLUTION | Freq: Four times a day (QID) | RESPIRATORY_TRACT | Status: DC | PRN
  Administered 2013-03-31: 2.5 mg via RESPIRATORY_TRACT
  Filled 2013-03-30: qty 3

## 2013-03-30 MED ORDER — IPRATROPIUM-ALBUTEROL 0.5-2.5 (3) MG/3ML IN SOLN
3.0000 mL | Freq: Two times a day (BID) | RESPIRATORY_TRACT | Status: DC
  Administered 2013-03-31 (×2): 3 mL via RESPIRATORY_TRACT
  Filled 2013-03-30 (×3): qty 3

## 2013-03-30 MED ORDER — POTASSIUM CHLORIDE 10 MEQ/100ML IV SOLN: 10.0000 meq | INTRAVENOUS | Status: DC

## 2013-03-30 MED ORDER — ENOXAPARIN SODIUM 30 MG/0.3ML ~~LOC~~ SOLN
30.0000 mg | SUBCUTANEOUS | Status: DC
  Filled 2013-03-30: qty 0.3

## 2013-03-30 MED ORDER — ACETAMINOPHEN 325 MG PO TABS
650.0000 mg | ORAL_TABLET | Freq: Once | ORAL | Status: AC
  Administered 2013-03-30: 650 mg via ORAL
  Filled 2013-03-30: qty 2

## 2013-03-30 MED ORDER — PANTOPRAZOLE SODIUM 40 MG PO TBEC
40.0000 mg | DELAYED_RELEASE_TABLET | Freq: Every day | ORAL | Status: DC
  Administered 2013-03-30 – 2013-04-01 (×3): 40 mg via ORAL
  Filled 2013-03-30 (×3): qty 1

## 2013-03-30 MED ORDER — HYDROMORPHONE HCL PF 1 MG/ML IJ SOLN
1.0000 mg | Freq: Four times a day (QID) | INTRAMUSCULAR | Status: DC
  Administered 2013-03-30 – 2013-04-01 (×9): 1 mg via INTRAVENOUS
  Filled 2013-03-30 (×10): qty 1

## 2013-03-30 MED ORDER — POTASSIUM CHLORIDE 10 MEQ/100ML IV SOLN
10.0000 meq | INTRAVENOUS | Status: AC
  Administered 2013-03-30 (×6): 10 meq via INTRAVENOUS
  Filled 2013-03-30 (×6): qty 100

## 2013-03-30 MED ORDER — ALBUTEROL SULFATE (2.5 MG/3ML) 0.083% IN NEBU
2.5000 mg | INHALATION_SOLUTION | RESPIRATORY_TRACT | Status: DC | PRN
  Administered 2013-03-30 (×2): 2.5 mg via RESPIRATORY_TRACT
  Filled 2013-03-30 (×2): qty 3

## 2013-03-30 NOTE — Progress Notes (Signed)
CRITICAL VALUE ALERT  Critical value received:  k 2.8  Date of notification:  01/30/13  Time of notification: 0645  Critical value read back: yes  Nurse who received alert:  TR  MD notified (1st page):  Baltazar Najjar  Time of first page: 0645  MD notified (2nd page)  Time of second pag  Responding MD:  Baltazar Najjar  Time MD responded: [phone]

## 2013-03-30 NOTE — Progress Notes (Addendum)
[facility] PROGRESS NOTE  [name] YQM:[id number] DOB: 07/27/1950 DOA: 03/27/2013 PCP: Ricke Hey, MD  Assessment/Plan: Acute Pancreatitis -Pain improved. -No further emesis, altho still with occasional nausea. -Wants to eat. -Will continue to advance diet.  ETOH Abuse/Withdrawals -Continue to monitor on CIWA protocol. -Thiamine/folate.  Severe Protein-Caloric Malnutrition -Appreciate nutrition consultation. -Continue to advance diet.  Hypokalemia -Replete.   Code Status: Full Code Family Communication: Patient only  Disposition Plan: Home when ready   Consultants:  None   Antibiotics:  None   Subjective: Pain is improved. Wants to eat solids.   Objective: Filed Vitals:   03/29/13 1843 03/29/13 2125 03/30/13 0558 03/30/13 0930  BP: 147/87 169/89 131/93   Pulse: 66 86 82   Temp: 97.8 F (36.6 C) 97.7 F (36.5 C) 97.3 F (36.3 C)   TempSrc: Oral Axillary Oral   Resp: 16 19 16    Height:      Weight:   38.2 kg (84 lb 3.5 oz)   SpO2: 96% 100% 97% 97%    Intake/Output Summary (Last 24 hours) at 03/30/13 1517 Last data filed at 03/30/13 0810  Gross per 24 hour  Intake    800 ml  Output   2450 ml  Net  -1650 ml   Filed Weights   03/28/13 1942 03/29/13 0731 03/30/13 0558  Weight: 35.7 kg (78 lb 11.3 oz) 38.783 kg (85 lb 8 oz) 38.2 kg (84 lb 3.5 oz)    Exam:   General:  AA Ox3  Cardiovascular: RRR  Respiratory: CTA B  Abdomen: S/NT/ND/+BS   Extremities: no C/C/E   Neurologic:  Non-focal  Data Reviewed: Basic Metabolic Panel:  Recent Labs Lab 03/28/13 0338 03/29/13 0535 03/30/13 0540  NA 136* 140 143  K 3.5* 3.2* 2.8*  CL 91* 103 106  CO2 26 25 27   GLUCOSE 115* 62* 88  BUN 8 10 7   CREATININE 0.56 0.57 0.57  CALCIUM 10.9* 8.7 8.7  MG  --   --  1.7   Liver Function Tests:  Recent Labs Lab 03/28/13 0338 03/30/13 0540  AST 51* 38*  ALT 21 20  ALKPHOS 149* 80  BILITOT 1.0 0.4  PROT 8.6* 5.4*  ALBUMIN  4.3 2.5*    Recent Labs Lab 03/28/13 0338  LIPASE 956*   No results found for this basename: AMMONIA,  in the last 168 hours CBC:  Recent Labs Lab 03/28/13 0338 03/29/13 0535 03/30/13 0540  WBC 12.3* 4.8 5.1  NEUTROABS 10.4*  --   --   HGB 15.9* 11.3* 11.5*  HCT 45.6 34.8* 34.7*  MCV 90.5 94.1 91.8  PLT 209 104* 97*   Cardiac Enzymes: No results found for this basename: CKTOTAL, CKMB, CKMBINDEX, TROPONINI,  in the last 168 hours BNP (last 3 results) No results found for this basename: PROBNP,  in the last 8760 hours CBG: No results found for this basename: GLUCAP,  in the last 168 hours  Recent Results (from the past 240 hour(s))  MRSA PCR SCREENING     Status: None   Collection Time    03/28/13  8:12 AM      Result Value Ref Range Status   MRSA by PCR NEGATIVE  NEGATIVE Final   Comment:            The GeneXpert MRSA Assay (FDA     approved for NASAL specimens     only), is one component of a     comprehensive MRSA colonization     surveillance  program. It is not     intended to diagnose MRSA     infection nor to guide or     monitor treatment for     MRSA infections.     Studies: Ir Fluoro Guide Cv Line Left  03/28/2013   CLINICAL DATA:  Pancreatitis  EXAM: Left upper extremity PICC LINE PLACEMENT WITH ULTRASOUND AND FLUOROSCOPIC GUIDANCE  FLUOROSCOPY TIME:  12 seconds.  PROCEDURE: The patient was advised of the possible risks andcomplications and agreed to undergo the procedure. The patient was then brought to the angiographic suite for the procedure.  The left arm was prepped with chlorhexidine, drapedin the usual sterile fashion using maximum barrier technique (cap and mask, sterile gown, sterile gloves, large sterile sheet, hand hygiene and cutaneous antisepsis) and infiltrated locally with 1% Lidocaine.  Ultrasound demonstrated patency of the left basilic vein, and this was documented with an image. Under real-time ultrasound guidance, this vein was accessed  with a 21 gauge micropuncture needle and image documentation was performed. A 0.018 wire was introduced in to the vein. Over this, a 5 [place] double lumen Power PICC was advanced to the lower SVC/right atrial junction. Fluoroscopy during the procedure and fluoro spot radiograph confirms appropriate catheter position. The catheter was flushed and covered with asterile dressing.  Complications: None  IMPRESSION: Successful left arm Power PICC line placement with ultrasound and fluoroscopic guidance. The catheter is ready for use.   Electronically Signed   By: Maryclare Bean M.D.   On: 03/28/2013 16:59   Ir US Guide Vasc Access Right  03/28/2013   CLINICAL DATA:  Pancreatitis  EXAM: Left upper extremity PICC LINE PLACEMENT WITH ULTRASOUND AND FLUOROSCOPIC GUIDANCE  FLUOROSCOPY TIME:  12 seconds.  PROCEDURE: The patient was advised of the possible risks andcomplications and agreed to undergo the procedure. The patient was then brought to the angiographic suite for the procedure.  The left arm was prepped with chlorhexidine, drapedin the usual sterile fashion using maximum barrier technique (cap and mask, sterile gown, sterile gloves, large sterile sheet, hand hygiene and cutaneous antisepsis) and infiltrated locally with 1% Lidocaine.  Ultrasound demonstrated patency of the left basilic vein, and this was documented with an image. Under real-time ultrasound guidance, this vein was accessed with a 21 gauge micropuncture needle and image documentation was performed. A 0.018 wire was introduced in to the vein. Over this, a 5 [place] double lumen Power PICC was advanced to the lower SVC/right atrial junction. Fluoroscopy during the procedure and fluoro spot radiograph confirms appropriate catheter position. The catheter was flushed and covered with asterile dressing.  Complications: None  IMPRESSION: Successful left arm Power PICC line placement with ultrasound and fluoroscopic guidance. The catheter is ready for use.    Electronically Signed   By: Maryclare Bean M.D.   On: 03/28/2013 16:59    Scheduled Meds: .  HYDROmorphone (DILAUDID) injection  1 mg Intravenous Q6H  . nicotine  14 mg Transdermal Daily  . pantoprazole  40 mg Oral Daily  . potassium chloride  10 mEq Intravenous Q1 Hr x 6  . sodium chloride  10-40 mL Intracatheter Q12H  . sodium chloride  3 mL Intravenous Q12H   Continuous Infusions: . sodium chloride 1,000 mL (03/28/13 1624)    Principal Problem:   Recurrent acute pancreatitis Active Problems:   ANXIETY DISORDER   Nausea and vomiting   Protein-calorie malnutrition, severe   Alcohol withdrawal    Time spent: 35 minutes. Greater than 50% of this time  was spent in direct contact with the patient coordinating care.    Lelon Frohlich  [facility] Pager [phone]  If 7PM-7AM, please contact night-coverage at [email or web address], password [facility] 03/30/2013, 3:17 PM  LOS: 3 days

## 2013-03-31 LAB — BASIC METABOLIC PANEL
BUN: 8 mg/dL (ref 6–23)
CO2: 25 mEq/L (ref 19–32)
CREATININE: 0.66 mg/dL (ref 0.50–1.10)
Calcium: 9 mg/dL (ref 8.4–10.5)
Chloride: 108 mEq/L (ref 96–112)
GLUCOSE: 85 mg/dL (ref 70–99)
Potassium: 3.5 mEq/L — ABNORMAL LOW (ref 3.7–5.3)
Sodium: 143 mEq/L (ref 137–147)

## 2013-03-31 NOTE — Progress Notes (Signed)
[facility] PROGRESS NOTE  [name] [id number] DOB: 06/19/1950 DOA: 03/27/2013 PCP: Ricke Hey, MD  Assessment/Plan: Acute Pancreatitis -Pain improved. -No further emesis, altho still with occasional nausea. -Wants to eat. -Will continue to advance diet.  ETOH Abuse/Withdrawals -Continue to monitor on CIWA protocol. -Thiamine/folate.  Severe Protein-Caloric Malnutrition -Appreciate nutrition consultation. -Continue to advance diet.  Hypokalemia -Replete.   Code Status: Full Code Family Communication: Patient only  Disposition Plan: Home when ready; likely 24 hours.   Consultants:  None   Antibiotics:  None   Subjective: Pain is improved. Wants to eat solids.   Objective: Filed Vitals:   03/31/13 0400 03/31/13 0526 03/31/13 0757 03/31/13 1311  BP: 159/95 136/91  124/84  Pulse: 88 85  84  Temp:  96.9 F (36.1 C)  97.6 F (36.4 C)  TempSrc:  Axillary  Oral  Resp:  20  18  Height:      Weight:  39.1 kg (86 lb 3.2 oz)    SpO2:  100% 100% 98%    Intake/Output Summary (Last 24 hours) at 03/31/13 1353 Last data filed at 03/31/13 0600  Gross per 24 hour  Intake   1640 ml  Output   1100 ml  Net    540 ml   Filed Weights   03/29/13 0731 03/30/13 0558 03/31/13 0526  Weight: 38.783 kg (85 lb 8 oz) 38.2 kg (84 lb 3.5 oz) 39.1 kg (86 lb 3.2 oz)    Exam:   General:  AA Ox3  Cardiovascular: RRR  Respiratory: CTA B  Abdomen: S/NT/ND/+BS   Extremities: no C/C/E   Neurologic:  Non-focal  Data Reviewed: Basic Metabolic Panel:  Recent Labs Lab 03/28/13 0338 03/29/13 0535 03/30/13 0540 03/31/13 0920  NA 136* 140 143 143  K 3.5* 3.2* 2.8* 3.5*  CL 91* 103 106 108  CO2 26 25 27 25   GLUCOSE 115* 62* 88 85  BUN 8 10 7 8   CREATININE 0.56 0.57 0.57 0.66  CALCIUM 10.9* 8.7 8.7 9.0  MG  --   --  1.7  --    Liver Function Tests:  Recent Labs Lab 03/28/13 0338 03/30/13 0540  AST 51* 38*  ALT 21 20  ALKPHOS 149* 80   BILITOT 1.0 0.4  PROT 8.6* 5.4*  ALBUMIN 4.3 2.5*    Recent Labs Lab 03/28/13 0338  LIPASE 956*   No results found for this basename: AMMONIA,  in the last 168 hours CBC:  Recent Labs Lab 03/28/13 0338 03/29/13 0535 03/30/13 0540  WBC 12.3* 4.8 5.1  NEUTROABS 10.4*  --   --   HGB 15.9* 11.3* 11.5*  HCT 45.6 34.8* 34.7*  MCV 90.5 94.1 91.8  PLT 209 104* 97*   Cardiac Enzymes: No results found for this basename: CKTOTAL, CKMB, CKMBINDEX, TROPONINI,  in the last 168 hours BNP (last 3 results) No results found for this basename: PROBNP,  in the last 8760 hours CBG: No results found for this basename: GLUCAP,  in the last 168 hours  Recent Results (from the past 240 hour(s))  MRSA PCR SCREENING     Status: None   Collection Time    03/28/13  8:12 AM      Result Value Ref Range Status   MRSA by PCR NEGATIVE  NEGATIVE Final   Comment:            The GeneXpert MRSA Assay (FDA     approved for NASAL specimens     only), is one component  of a     comprehensive MRSA colonization     surveillance program. It is not     intended to diagnose MRSA     infection nor to guide or     monitor treatment for     MRSA infections.     Studies: No results found.  Scheduled Meds: .  HYDROmorphone (DILAUDID) injection  1 mg Intravenous Q6H  . ipratropium-albuterol  3 mL Nebulization BID  . nicotine  14 mg Transdermal Daily  . pantoprazole  40 mg Oral Daily  . sodium chloride  10-40 mL Intracatheter Q12H  . sodium chloride  3 mL Intravenous Q12H   Continuous Infusions: . sodium chloride 1,000 mL (03/28/13 1624)    Principal Problem:   Recurrent acute pancreatitis Active Problems:   ANXIETY DISORDER   Nausea and vomiting   Protein-calorie malnutrition, severe   Alcohol withdrawal    Time spent: 35 minutes. Greater than 50% of this time was spent in direct contact with the patient coordinating care.    Lelon Frohlich  [facility] Pager [phone]   If 7PM-7AM, please contact night-coverage at [email or web address], password [facility] 03/31/2013, 1:53 PM  LOS: 4 days

## 2013-04-01 MED ORDER — LORAZEPAM 1 MG PO TABS: 1.0000 mg | ORAL_TABLET | Freq: Four times a day (QID) | ORAL | Status: DC | PRN

## 2013-04-01 MED ORDER — ACETAMINOPHEN 325 MG PO TABS: 650.0000 mg | ORAL_TABLET | Freq: Four times a day (QID) | ORAL | Status: DC | PRN

## 2013-04-01 MED ORDER — FOLIC ACID 1 MG PO TABS: 1.0000 mg | ORAL_TABLET | Freq: Every day | ORAL | Status: DC

## 2013-04-01 MED ORDER — HYDROMORPHONE HCL 4 MG PO TABS: 4.0000 mg | ORAL_TABLET | ORAL | Status: DC | PRN

## 2013-04-01 MED ORDER — VITAMIN B-1 100 MG PO TABS: 100.0000 mg | ORAL_TABLET | Freq: Every day | ORAL | Status: DC

## 2013-04-01 NOTE — Progress Notes (Signed)
Discharge instructions explained to pt and prescriptions given to her. Picc removed by the IV team. Husband in to pick up pt and take her home.

## 2013-04-01 NOTE — Discharge Summary (Signed)
Physician Discharge Summary  [name] [id number]:[id number] DOB: [birth year] DOA: 03/27/2013  PCP: Janice Hey, [name]  Admit date: 03/27/2013 Discharge date: 04/01/2013  Time spent: 45 minutes  Recommendations for Outpatient Follow-up:  -Will be discharged home today. -Advised to followup with her primary care provider in 2 weeks.   Discharge Diagnoses:  Principal Problem:   Recurrent acute pancreatitis Active Problems:   ANXIETY DISORDER   Nausea and vomiting   Protein-calorie malnutrition, severe   Alcohol withdrawal   Discharge Condition: Stable and improved  Filed Weights   03/30/13 0558 03/31/13 0526 04/01/13 0651  Weight: 38.2 kg (84 lb 3.5 oz) 39.1 kg (86 lb 3.2 oz) 38.238 kg (84 lb 4.8 oz)    History of present illness:  [name] is a 63 y.o. female with Past medical history of chronic pancreatitis, alcohol abuse, COPD, anxiety disorder, substance abuse.  The patient is coming from home.  The patient presented with complaints of abdominal pain nausea vomiting and diarrhea. She woke up in the morning with 6-7 episodes of loose watery bowel movement without any blood but then improved during the day. Late in the night she had 1 beer and then she went to bed. She woke up with complaints of nausea and multiple vomiting without any blood in severe abdominal pain. She has chronic abdominal pain which is sharp in nature and located diffuse leg primarily in the upper abdomen and it is the same pain that she always has but more severe intensity at the time of my evaluation as per the patient.  She denies any fever or chills she denies any cough or chest pain denies any shortness of breath. She complains of some palpitations and tremors. She complains of anxiety as well. She denies any focal neurological deficit.  She mentions that her last drink was today but before that she hasn't had a drink since at least last 4 days. Hospitalist admission was requested.  Hospital  Course:   Acute on Chronic Pancreatitis  -Pain improved.  -No further emesis, altho still with occasional nausea.  -Tolerating solid diet. -Will be given a 15 day supply of dilaudid. Will need to follow up with her PCP for further medication refills.  ETOH Abuse/Withdrawals  -No active signs of withdrawals. -Thiamine/folate.   Severe Protein-Caloric Malnutrition  -Appreciate nutrition consultation.  -Continue to advance diet.   Hypokalemia  -Repleted.   Procedures:  None   Consultations:  None  Discharge Instructions  Discharge Orders   Future Orders Complete By Expires   Discontinue IV  As directed    Increase activity slowly  As directed        Medication List         fluticasone 110 MCG/ACT inhaler  Commonly known as:  FLOVENT HFA  Inhale 2 puffs into the lungs 2 (two) times daily.     folic acid 1 MG tablet  Commonly known as:  FOLVITE  Take 1 tablet (1 mg total) by mouth daily.     HYDROmorphone 4 MG tablet  Commonly known as:  DILAUDID  Take 1 tablet (4 mg total) by mouth every 4 (four) hours as needed for severe pain.     LORazepam 1 MG tablet  Commonly known as:  ATIVAN  Take 1 tablet (1 mg total) by mouth every 6 (six) hours as needed. For anxiety.     ondansetron 8 MG tablet  Commonly known as:  ZOFRAN  Take 8 mg by mouth every 8 (eight)  hours as needed for nausea or vomiting.     thiamine 100 MG tablet  Commonly known as:  VITAMIN B-1  Take 1 tablet (100 mg total) by mouth daily.       Allergies  Allergen Reactions  . Aspirin Other (See Comments)    bleeding  . Chlorpromazine Hcl Other (See Comments)    Muscle spasms       Follow-up Information   Follow up with Janice Hey, [name]. Schedule an appointment as soon as possible for a visit in 2 weeks.   Specialty:  Family Medicine   Contact information:   [employer] [employer] [place] [address] [phone]        The results of significant diagnostics from this  hospitalization (including imaging, microbiology, ancillary and laboratory) are listed below for reference.    Significant Diagnostic Studies: Dg Chest 2 View  03/28/2013   CLINICAL DATA:  Shortness of breath and chest pain for the past week.  EXAM: CHEST  2 VIEW  COMPARISON:  Chest x-ray 03/09/2013.  FINDINGS: No acute consolidative airspace disease. No pleural effusions. Nausea its of pulmonary edema. Heart size is normal. Upper mediastinal contours are within normal limits. Atherosclerosis in the thoracic aorta.  IMPRESSION: 1. No radiographic evidence of acute cardiopulmonary disease. 2. Atherosclerosis.   Electronically Signed   By: Vinnie Langton M.D.   On: 03/28/2013 03:51   Dg Chest 2 View  03/09/2013   CLINICAL DATA:  Chest pain and shortness of breath.  EXAM: CHEST  2 VIEW  COMPARISON:  CT ANGIO CHEST W/CM &/OR WO/CM dated 02/09/2013; DG CHEST 1V PORT dated 02/09/2013  FINDINGS: Two views of the chest demonstrate hyperinflation. Heart size is stable. The trachea is midline. No focal airspace disease or edema.  IMPRESSION: No acute chest findings.   Electronically Signed   By: Markus Daft M.D.   On: 03/09/2013 17:55   US Abdomen Limited  03/28/2013   CLINICAL DATA:  Pancreatitis, abdominal pain  EXAM: US ABDOMEN LIMITED - RIGHT UPPER QUADRANT  COMPARISON:  DG ABD PORTABLE 1V dated 03/10/2013; CT ABD/PELVIS W CM dated 02/09/2013  FINDINGS: Gallbladder:  No gallbladder wall thickening or pericholecystic fluid. No echogenic gallstones. Negative sonographic Murphy's sign.  Common bile duct:  Diameter: Common bile duct is dilated to 9 to 12 mm. No obstructing lesion identified.  Liver:  Liver is increased in echogenicity.  At the root of dilatation.  IMPRESSION: 1. Dilatation of the common bile duct. If patient has elevated liver function tests (bilirubin) consider for MRCP further evaluation.  2.  Echogenic liver consistent with given history of cirrhosis   Electronically Signed   By: Suzy Bouchard M.D.    On: 03/28/2013 07:10   Ir Fluoro Guide Cv Line Left  03/28/2013   CLINICAL DATA:  Pancreatitis  EXAM: Left upper extremity PICC LINE PLACEMENT WITH ULTRASOUND AND FLUOROSCOPIC GUIDANCE  FLUOROSCOPY TIME:  12 seconds.  PROCEDURE: The patient was advised of the possible risks andcomplications and agreed to undergo the procedure. The patient was then brought to the angiographic suite for the procedure.  The left arm was prepped with chlorhexidine, drapedin the usual sterile fashion using maximum barrier technique (cap and mask, sterile gown, sterile gloves, large sterile sheet, hand hygiene and cutaneous antisepsis) and infiltrated locally with 1% Lidocaine.  Ultrasound demonstrated patency of the left basilic vein, and this was documented with an image. Under real-time ultrasound guidance, this vein was accessed with a 21 gauge micropuncture needle and  image documentation was performed. A 0.018 wire was introduced in to the vein. Over this, a 5 [place] double lumen Power PICC was advanced to the lower SVC/right atrial junction. Fluoroscopy during the procedure and fluoro spot radiograph confirms appropriate catheter position. The catheter was flushed and covered with asterile dressing.  Complications: None  IMPRESSION: Successful left arm Power PICC line placement with ultrasound and fluoroscopic guidance. The catheter is ready for use.   Electronically Signed   By: Maryclare Bean M.D.   On: 03/28/2013 16:59   Ir US Guide Vasc Access Right  03/28/2013   CLINICAL DATA:  Pancreatitis  EXAM: Left upper extremity PICC LINE PLACEMENT WITH ULTRASOUND AND FLUOROSCOPIC GUIDANCE  FLUOROSCOPY TIME:  12 seconds.  PROCEDURE: The patient was advised of the possible risks andcomplications and agreed to undergo the procedure. The patient was then brought to the angiographic suite for the procedure.  The left arm was prepped with chlorhexidine, drapedin the usual sterile fashion using maximum barrier technique (cap and mask, sterile  gown, sterile gloves, large sterile sheet, hand hygiene and cutaneous antisepsis) and infiltrated locally with 1% Lidocaine.  Ultrasound demonstrated patency of the left basilic vein, and this was documented with an image. Under real-time ultrasound guidance, this vein was accessed with a 21 gauge micropuncture needle and image documentation was performed. A 0.018 wire was introduced in to the vein. Over this, a 5 [place] double lumen Power PICC was advanced to the lower SVC/right atrial junction. Fluoroscopy during the procedure and fluoro spot radiograph confirms appropriate catheter position. The catheter was flushed and covered with asterile dressing.  Complications: None  IMPRESSION: Successful left arm Power PICC line placement with ultrasound and fluoroscopic guidance. The catheter is ready for use.   Electronically Signed   By: Maryclare Bean M.D.   On: 03/28/2013 16:59   Dg Abd Portable 1v  03/10/2013   CLINICAL DATA:  Abdominal pain  EXAM: PORTABLE ABDOMEN - 1 VIEW  COMPARISON:  None.  FINDINGS: The stool burden within the colon is moderately increased. There is no significant small or large bowel dilation. There is gas within the stomach as well as within the rectum. There are degenerative changes of the lumbar spine and of the hips. The patient has undergone previous ORIF for left hip fracture. There are densities within the pelvis consistent with arterial calcifications as well as phleboliths and likely retained contrast within diverticulum.  IMPRESSION: The bowel gas pattern is nonspecific. There is no evidence of ileus nor of obstruction. A moderately increased stool burden is present within the colon.   Electronically Signed   By: David  Martinique   On: 03/10/2013 12:48    Microbiology: Recent Results (from the past 240 hour(s))  MRSA PCR SCREENING     Status: None   Collection Time    03/28/13  8:12 AM      Result Value Ref Range Status   MRSA by PCR NEGATIVE  NEGATIVE Final   Comment:             The GeneXpert MRSA Assay (FDA     approved for NASAL specimens     only), is one component of a     comprehensive MRSA colonization     surveillance program. It is not     intended to diagnose MRSA     infection nor to guide or     monitor treatment for     MRSA infections.     Labs: Basic Metabolic Panel:  Recent Labs Lab 03/28/13 0338 03/29/13 0535 03/30/13 0540 03/31/13 0920  NA 136* 140 143 143  K 3.5* 3.2* 2.8* 3.5*  CL 91* 103 106 108  CO2 26 25 27 25   GLUCOSE 115* 62* 88 85  BUN 8 10 7 8   CREATININE 0.56 0.57 0.57 0.66  CALCIUM 10.9* 8.7 8.7 9.0  MG  --   --  1.7  --    Liver Function Tests:  Recent Labs Lab 03/28/13 0338 03/30/13 0540  AST 51* 38*  ALT 21 20  ALKPHOS 149* 80  BILITOT 1.0 0.4  PROT 8.6* 5.4*  ALBUMIN 4.3 2.5*    Recent Labs Lab 03/28/13 0338  LIPASE 956*   No results found for this basename: AMMONIA,  in the last 168 hours CBC:  Recent Labs Lab 03/28/13 0338 03/29/13 0535 03/30/13 0540  WBC 12.3* 4.8 5.1  NEUTROABS 10.4*  --   --   HGB 15.9* 11.3* 11.5*  HCT 45.6 34.8* 34.7*  MCV 90.5 94.1 91.8  PLT 209 104* 97*   Cardiac Enzymes: No results found for this basename: CKTOTAL, CKMB, CKMBINDEX, TROPONINI,  in the last 168 hours BNP: BNP (last 3 results) No results found for this basename: PROBNP,  in the last 8760 hours CBG: No results found for this basename: GLUCAP,  in the last 168 hours     Signed:  Lelon Frohlich  [facility] Pager: [phone] 04/01/2013, 2:34 PM

## 2013-06-01 ENCOUNTER — Inpatient Hospital Stay (HOSPITAL_COMMUNITY)
Admission: EM | Admit: 2013-06-01 | Discharge: 2013-06-06 | DRG: 439 | Disposition: A | Discharge status: Home / Self Care | Payer: Medicaid Other | Attending: Internal Medicine | Admitting: Internal Medicine

## 2013-06-01 ENCOUNTER — Emergency Department (HOSPITAL_COMMUNITY): Payer: Medicaid Other

## 2013-06-01 ENCOUNTER — Encounter (HOSPITAL_COMMUNITY): Payer: Self-pay | Admitting: Emergency Medicine

## 2013-06-01 DIAGNOSIS — K7460 Unspecified cirrhosis of liver: Secondary | ICD-10-CM

## 2013-06-01 DIAGNOSIS — R52 Pain, unspecified: Secondary | ICD-10-CM | POA: Diagnosis present

## 2013-06-01 DIAGNOSIS — E860 Dehydration: Secondary | ICD-10-CM | POA: Diagnosis present

## 2013-06-01 DIAGNOSIS — R112 Nausea with vomiting, unspecified: Secondary | ICD-10-CM | POA: Diagnosis present

## 2013-06-01 DIAGNOSIS — G8929 Other chronic pain: Secondary | ICD-10-CM | POA: Diagnosis present

## 2013-06-01 DIAGNOSIS — E872 Acidosis, unspecified: Secondary | ICD-10-CM | POA: Diagnosis present

## 2013-06-01 DIAGNOSIS — R030 Elevated blood-pressure reading, without diagnosis of hypertension: Secondary | ICD-10-CM | POA: Diagnosis present

## 2013-06-01 DIAGNOSIS — Z903 Acquired absence of stomach [part of]: Secondary | ICD-10-CM

## 2013-06-01 DIAGNOSIS — F10931 Alcohol use, unspecified with withdrawal delirium: Secondary | ICD-10-CM

## 2013-06-01 DIAGNOSIS — K852 Alcohol induced acute pancreatitis without necrosis or infection: Secondary | ICD-10-CM

## 2013-06-01 DIAGNOSIS — F13239 Sedative, hypnotic or anxiolytic dependence with withdrawal, unspecified: Secondary | ICD-10-CM

## 2013-06-01 DIAGNOSIS — F411 Generalized anxiety disorder: Secondary | ICD-10-CM

## 2013-06-01 DIAGNOSIS — R109 Unspecified abdominal pain: Secondary | ICD-10-CM

## 2013-06-01 DIAGNOSIS — J441 Chronic obstructive pulmonary disease with (acute) exacerbation: Secondary | ICD-10-CM

## 2013-06-01 DIAGNOSIS — F10939 Alcohol use, unspecified with withdrawal, unspecified: Secondary | ICD-10-CM

## 2013-06-01 DIAGNOSIS — F10239 Alcohol dependence with withdrawal, unspecified: Secondary | ICD-10-CM

## 2013-06-01 DIAGNOSIS — R1013 Epigastric pain: Secondary | ICD-10-CM | POA: Diagnosis present

## 2013-06-01 DIAGNOSIS — K860 Alcohol-induced chronic pancreatitis: Secondary | ICD-10-CM

## 2013-06-01 DIAGNOSIS — F1123 Opioid dependence with withdrawal: Secondary | ICD-10-CM

## 2013-06-01 DIAGNOSIS — F17200 Nicotine dependence, unspecified, uncomplicated: Secondary | ICD-10-CM | POA: Diagnosis present

## 2013-06-01 DIAGNOSIS — I10 Essential (primary) hypertension: Secondary | ICD-10-CM

## 2013-06-01 DIAGNOSIS — F1020 Alcohol dependence, uncomplicated: Secondary | ICD-10-CM | POA: Diagnosis present

## 2013-06-01 DIAGNOSIS — F13939 Sedative, hypnotic or anxiolytic use, unspecified with withdrawal, unspecified: Secondary | ICD-10-CM

## 2013-06-01 DIAGNOSIS — K859 Acute pancreatitis without necrosis or infection, unspecified: Principal | ICD-10-CM | POA: Diagnosis present

## 2013-06-01 DIAGNOSIS — Z8249 Family history of ischemic heart disease and other diseases of the circulatory system: Secondary | ICD-10-CM

## 2013-06-01 DIAGNOSIS — F10232 Alcohol dependence with withdrawal with perceptual disturbance: Secondary | ICD-10-CM

## 2013-06-01 DIAGNOSIS — Z79899 Other long term (current) drug therapy: Secondary | ICD-10-CM

## 2013-06-01 DIAGNOSIS — F1193 Opioid use, unspecified with withdrawal: Secondary | ICD-10-CM

## 2013-06-01 DIAGNOSIS — Z681 Body mass index (BMI) 19 or less, adult: Secondary | ICD-10-CM

## 2013-06-01 DIAGNOSIS — F10231 Alcohol dependence with withdrawal delirium: Secondary | ICD-10-CM | POA: Diagnosis present

## 2013-06-01 DIAGNOSIS — F10932 Alcohol use, unspecified with withdrawal with perceptual disturbance: Secondary | ICD-10-CM

## 2013-06-01 DIAGNOSIS — F1920 Other psychoactive substance dependence, uncomplicated: Secondary | ICD-10-CM | POA: Diagnosis present

## 2013-06-01 DIAGNOSIS — K861 Other chronic pancreatitis: Secondary | ICD-10-CM

## 2013-06-01 DIAGNOSIS — R000 Tachycardia, unspecified: Secondary | ICD-10-CM

## 2013-06-01 LAB — COMPREHENSIVE METABOLIC PANEL
ALT: 37 U/L — ABNORMAL HIGH (ref 0–35)
AST: 45 U/L — AB (ref 0–37)
Albumin: 3.8 g/dL (ref 3.5–5.2)
Alkaline Phosphatase: 123 U/L — ABNORMAL HIGH (ref 39–117)
BUN: 5 mg/dL — AB (ref 6–23)
CALCIUM: 9.8 mg/dL (ref 8.4–10.5)
CHLORIDE: 95 meq/L — AB (ref 96–112)
CO2: 22 meq/L (ref 19–32)
Creatinine, Ser: 0.63 mg/dL (ref 0.50–1.10)
GFR calc Af Amer: >90 mL/min (ref >90)
GFR calc non Af Amer: >90 mL/min (ref >90)
Glucose, Bld: 124 mg/dL — ABNORMAL HIGH (ref 70–99)
Potassium: 3.5 mEq/L — ABNORMAL LOW (ref 3.7–5.3)
Sodium: 135 mEq/L — ABNORMAL LOW (ref 137–147)
Total Bilirubin: 0.3 mg/dL (ref 0.3–1.2)
Total Protein: 8.2 g/dL (ref 6.0–8.3)

## 2013-06-01 LAB — CBC
HCT: 37.3 % (ref 36.0–46.0)
Hemoglobin: 12.9 g/dL (ref 12.0–15.0)
MCH: 30.4 pg (ref 26.0–34.0)
MCHC: 34.6 g/dL (ref 30.0–36.0)
MCV: 88 fL (ref 78.0–100.0)
Platelets: 143 10*3/uL — ABNORMAL LOW (ref 150–400)
RBC: 4.24 MIL/uL (ref 3.87–5.11)
RDW: 14.3 % (ref 11.5–15.5)
WBC: 4.7 10*3/uL (ref 4.0–10.5)

## 2013-06-01 LAB — LIPASE, BLOOD: Lipase: 31 U/L (ref 11–59)

## 2013-06-01 LAB — I-STAT CG4 LACTIC ACID, ED: LACTIC ACID, VENOUS: 4.13 mmol/L — AB (ref 0.5–2.2)

## 2013-06-01 MED ORDER — VITAMIN B-1 100 MG PO TABS
100.0000 mg | ORAL_TABLET | Freq: Every day | ORAL | Status: DC
  Administered 2013-06-02: 100 mg via ORAL
  Filled 2013-06-01: qty 1

## 2013-06-01 MED ORDER — LORAZEPAM 1 MG PO TABS: 0.0000 mg | ORAL_TABLET | Freq: Four times a day (QID) | ORAL | Status: DC

## 2013-06-01 MED ORDER — LORAZEPAM 1 MG PO TABS: 0.0000 mg | ORAL_TABLET | Freq: Two times a day (BID) | ORAL | Status: DC

## 2013-06-01 MED ORDER — LORAZEPAM 2 MG/ML IJ SOLN
0.0000 mg | Freq: Four times a day (QID) | INTRAMUSCULAR | Status: DC
  Administered 2013-06-01: 2 mg via INTRAVENOUS
  Filled 2013-06-01: qty 1

## 2013-06-01 MED ORDER — METHYLPREDNISOLONE SODIUM SUCC 125 MG IJ SOLR
125.0000 mg | Freq: Once | INTRAMUSCULAR | Status: AC
  Administered 2013-06-01: 125 mg via INTRAVENOUS
  Filled 2013-06-01: qty 2

## 2013-06-01 MED ORDER — LORAZEPAM 2 MG/ML IJ SOLN
0.0000 mg | Freq: Two times a day (BID) | INTRAMUSCULAR | Status: DC
  Administered 2013-06-01: 2 mg via INTRAVENOUS
  Filled 2013-06-01: qty 1

## 2013-06-01 MED ORDER — HYDROMORPHONE HCL PF 1 MG/ML IJ SOLN
1.0000 mg | Freq: Once | INTRAMUSCULAR | Status: AC
  Administered 2013-06-01: 1 mg via INTRAVENOUS
  Filled 2013-06-01: qty 1

## 2013-06-01 MED ORDER — SODIUM CHLORIDE 0.9 % IV BOLUS (SEPSIS)
1000.0000 mL | Freq: Once | INTRAVENOUS | Status: AC
  Administered 2013-06-02: 1000 mL via INTRAVENOUS

## 2013-06-01 MED ORDER — SODIUM CHLORIDE 0.9 % IV BOLUS (SEPSIS)
1000.0000 mL | Freq: Once | INTRAVENOUS | Status: AC
  Administered 2013-06-01: 1000 mL via INTRAVENOUS

## 2013-06-01 MED ORDER — THIAMINE HCL 100 MG/ML IJ SOLN
100.0000 mg | Freq: Every day | INTRAMUSCULAR | Status: DC
  Filled 2013-06-01: qty 1

## 2013-06-01 MED ORDER — LORAZEPAM 2 MG/ML IJ SOLN
1.0000 mg | Freq: Once | INTRAMUSCULAR | Status: AC
  Administered 2013-06-01: 1 mg via INTRAVENOUS
  Filled 2013-06-01: qty 1

## 2013-06-01 MED ORDER — ONDANSETRON HCL 4 MG/2ML IJ SOLN
4.0000 mg | Freq: Once | INTRAMUSCULAR | Status: AC
  Administered 2013-06-01: 4 mg via INTRAVENOUS
  Filled 2013-06-01: qty 2

## 2013-06-01 MED ORDER — ALBUTEROL (5 MG/ML) CONTINUOUS INHALATION SOLN
10.0000 mg/h | INHALATION_SOLUTION | Freq: Once | RESPIRATORY_TRACT | Status: AC
  Administered 2013-06-01: 10 mg/h via RESPIRATORY_TRACT
  Filled 2013-06-01: qty 20

## 2013-06-01 MED ORDER — IPRATROPIUM BROMIDE 0.02 % IN SOLN
0.5000 mg | Freq: Once | RESPIRATORY_TRACT | Status: AC
  Administered 2013-06-01: 0.5 mg via RESPIRATORY_TRACT
  Filled 2013-06-01: qty 2.5

## 2013-06-01 NOTE — ED Notes (Signed)
MD aware patient HR 130s-150s. Patient very shaky and appears to be withdrawing.

## 2013-06-01 NOTE — ED Notes (Signed)
2 mg ativan given under CIWA protocol unsuccessful. Patient still visibly agitated with tremors and c/o nausea. MD ok to repeat 2 mg ativan under CIWA protocol.

## 2013-06-01 NOTE — ED Notes (Signed)
Dr Mingo Amber aware of elevated I stat Lactic

## 2013-06-01 NOTE — ED Notes (Signed)
Per EMS- Abdominal pain started 2 days ago. Prescribed Dilaudid but says "this isn't enough." C/o generalized pain. Also per patient she's "having a COPD flair up." SpO2 97% on RA. Hx substance abuse. VS: HR 118 BP 178/94 SpO2 97%. ST on 3 Lead EKG.

## 2013-06-01 NOTE — ED Provider Notes (Signed)
CSN: [id number]     Arrival date & time 06/01/13  2010 History   First MD Initiated Contact with Patient 06/01/13 2019     Chief Complaint  Patient presents with  . Pancreatitis  . Abdominal Pain     (Consider location/radiation/quality/duration/timing/severity/associated sxs/prior Treatment) Patient is a 63 y.o. female presenting with abdominal pain. The history is provided by the patient.  Abdominal Pain Pain location:  Epigastric Pain quality: aching and sharp   Pain radiates to:  Does not radiate Pain severity:  Moderate Onset quality:  Gradual Duration:  2 days Timing:  Constant Progression:  Worsening Chronicity:  Chronic Context: recent illness   Context: not alcohol use and not diet changes   Relieved by:  Nothing Worsened by:  Nothing tried Associated symptoms: cough (productive, white phlegm for past 2 weeks), nausea, shortness of breath and vomiting   Associated symptoms: no chest pain, no diarrhea, no fever, no hematemesis and no hematochezia     Past Medical History  Diagnosis Date  . Pancreatitis   . COPD (chronic obstructive pulmonary disease)   . Substance abuse   . Cancer     renal ca  . Seizures   . Pancreatitis    Past Surgical History  Procedure Laterality Date  . Partial gastrectomy     Family History  Problem Relation Age of Onset  . CAD Mother   . Alcoholism Father    History  Substance Use Topics  . Smoking status: Current Every Day Smoker -- 0.50 packs/day    Types: Cigarettes  . Smokeless tobacco: Never Used  . Alcohol Use: Yes     Comment: Hx of Alcohol abuse, reports she last drink was this AM, 24 oz X2 of beer per day   OB History   Grav Para Term Preterm Abortions TAB SAB Ect Mult Living                 Review of Systems  Constitutional: Negative for fever.  Respiratory: Positive for cough (productive, white phlegm for past 2 weeks) and shortness of breath.   Cardiovascular: Negative for chest pain and leg swelling.   Gastrointestinal: Positive for nausea, vomiting and abdominal pain. Negative for diarrhea, hematochezia and hematemesis.  All other systems reviewed and are negative.     Allergies  Aspirin and Chlorpromazine hcl  Home Medications   Prior to Admission medications   Medication Sig Start Date End Date Taking? Authorizing Provider  fluticasone (FLOVENT HFA) 110 MCG/ACT inhaler Inhale 2 puffs into the lungs 2 (two) times daily.    Historical Provider, MD  folic acid (FOLVITE) 1 MG tablet Take 1 tablet (1 mg total) by mouth daily. 04/01/13   Erline Hau, MD  HYDROmorphone (DILAUDID) 4 MG tablet Take 1 tablet (4 mg total) by mouth every 4 (four) hours as needed for severe pain. 04/01/13   Erline Hau, MD  LORazepam (ATIVAN) 1 MG tablet Take 1 tablet (1 mg total) by mouth every 6 (six) hours as needed. For anxiety. 04/01/13   Erline Hau, MD  ondansetron (ZOFRAN) 8 MG tablet Take 8 mg by mouth every 8 (eight) hours as needed for nausea or vomiting.    Historical Provider, MD  thiamine (VITAMIN B-1) 100 MG tablet Take 1 tablet (100 mg total) by mouth daily. 04/01/13   Erline Hau, MD   BP 174/131  Pulse 130  Temp(Src) 98.5 F (36.9 C) (Oral)  Resp 26  SpO2 99% Physical  Exam  Nursing note and vitals reviewed. Constitutional: She is oriented to person, place, and time. She appears well-developed and well-nourished. No distress.  HENT:  Head: Normocephalic and atraumatic.  Mouth/Throat: Oropharynx is clear and moist.  Eyes: EOM are normal. Pupils are equal, round, and reactive to light.  Neck: Normal range of motion. Neck supple.  Cardiovascular: Regular rhythm.  Tachycardia present.  Exam reveals no friction rub.   No murmur heard. Pulmonary/Chest: She is in respiratory distress (mild). She has wheezes (moderate, diffuse). She has rhonchi (R sided) in the right lower field. She has no rales.  Abdominal: Soft. She exhibits no distension.  There is tenderness (epigastric). There is no rebound.  Musculoskeletal: Normal range of motion. She exhibits no edema.  Neurological: She is alert and oriented to person, place, and time.  Skin: No rash noted. She is not diaphoretic.    ED Course  Procedures (including critical care time) Labs Review Labs Reviewed  CBC  COMPREHENSIVE METABOLIC PANEL  LIPASE, BLOOD    Imaging Review Dg Chest Portable 1 View  06/01/2013   CLINICAL DATA:  63 year old female shortness of breath. Cough. Initial encounter.  EXAM: PORTABLE CHEST - 1 VIEW  COMPARISON:  03/28/2013 and earlier.  FINDINGS: Portable AP semi upright view at 2124 hrs. Stable large lung volumes. Stable cardiac size and mediastinal contours. Visualized tracheal air column is within normal limits. Stable mild increased interstitial markings with no pneumothorax, pulmonary edema, pleural effusion or acute pulmonary opacity.  IMPRESSION: Stable chronic lung disease. No superimposed acute findings are identified.   Electronically Signed   By: Lars Pinks M.D.   On: 06/01/2013 21:41     EKG Interpretation None      CRITICAL CARE Performed by: Osvaldo Shipper   Total critical care time: 30 minutes  Critical care time was exclusive of separately billable procedures and treating other patients.  Critical care was necessary to treat or prevent imminent or life-threatening deterioration.  Critical care was time spent personally by me on the following activities: development of treatment plan with patient and/or surrogate as well as nursing, discussions with consultants, evaluation of patient's response to treatment, examination of patient, obtaining history from patient or surrogate, ordering and performing treatments and interventions, ordering and review of laboratory studies, ordering and review of radiographic studies, pulse oximetry and re-evaluation of patient's condition.  MDM   Final diagnoses:  COPD exacerbation  Alcohol  withdrawal  Withdrawal from opioids  Tachycardia    86F w/ hx of chronic pancreatitis, COPD presents with abdominal pain, vomiting, SOB, and productive cough. Gets Dilaudid PO from her PCP, however unable to keep it down due to vomiting. Patient also noted productive cough for 2 weeks, productive of white phlegm, which is changed from her baseline cough. No fevers. Here afebrile, tachycardic - sinus tach on the monitor, normotensive. Moderate wheezes on exam, R sided rhonchi. Will obtain CXR, give IV pain and nausea meds, check labs. Steroids and albuterol ordered. Patient more tachycardic after albuterol, as expected, but she did not drop her HR at all. Lactate elevated. Drinks roughly 48 oz of beer a day, also takes large amount of pain meds a day. Put on CIWA for concerns of withdrawal. Extremely jittery. Lungs improving after albuterol, lungs opening up.  Dr. Alcario Drought admitting.  Osvaldo Shipper, MD 06/02/13 [phone]

## 2013-06-01 NOTE — ED Notes (Signed)
Bed: WA21 Expected date:  Expected time:  Means of arrival:  Comments: EMS/62 yo female with hx pancreatitis

## 2013-06-01 NOTE — ED Notes (Signed)
Bedside report received from previous RN, [employer].

## 2013-06-02 ENCOUNTER — Inpatient Hospital Stay (HOSPITAL_COMMUNITY): Payer: Medicaid Other

## 2013-06-02 DIAGNOSIS — J441 Chronic obstructive pulmonary disease with (acute) exacerbation: Secondary | ICD-10-CM

## 2013-06-02 DIAGNOSIS — E860 Dehydration: Secondary | ICD-10-CM | POA: Diagnosis present

## 2013-06-02 DIAGNOSIS — G8929 Other chronic pain: Secondary | ICD-10-CM | POA: Diagnosis present

## 2013-06-02 DIAGNOSIS — R1013 Epigastric pain: Secondary | ICD-10-CM | POA: Diagnosis present

## 2013-06-02 DIAGNOSIS — K861 Other chronic pancreatitis: Secondary | ICD-10-CM | POA: Diagnosis present

## 2013-06-02 DIAGNOSIS — R52 Pain, unspecified: Secondary | ICD-10-CM | POA: Diagnosis present

## 2013-06-02 DIAGNOSIS — E872 Acidosis, unspecified: Secondary | ICD-10-CM | POA: Diagnosis present

## 2013-06-02 DIAGNOSIS — F17200 Nicotine dependence, unspecified, uncomplicated: Secondary | ICD-10-CM | POA: Diagnosis present

## 2013-06-02 DIAGNOSIS — F10231 Alcohol dependence with withdrawal delirium: Secondary | ICD-10-CM | POA: Diagnosis present

## 2013-06-02 DIAGNOSIS — Z681 Body mass index (BMI) 19 or less, adult: Secondary | ICD-10-CM | POA: Diagnosis not present

## 2013-06-02 DIAGNOSIS — F1020 Alcohol dependence, uncomplicated: Secondary | ICD-10-CM | POA: Diagnosis present

## 2013-06-02 DIAGNOSIS — Z79899 Other long term (current) drug therapy: Secondary | ICD-10-CM | POA: Diagnosis not present

## 2013-06-02 DIAGNOSIS — K860 Alcohol-induced chronic pancreatitis: Secondary | ICD-10-CM | POA: Diagnosis present

## 2013-06-02 DIAGNOSIS — R030 Elevated blood-pressure reading, without diagnosis of hypertension: Secondary | ICD-10-CM | POA: Diagnosis present

## 2013-06-02 DIAGNOSIS — F10239 Alcohol dependence with withdrawal, unspecified: Secondary | ICD-10-CM

## 2013-06-02 DIAGNOSIS — F10931 Alcohol use, unspecified with withdrawal delirium: Secondary | ICD-10-CM | POA: Diagnosis present

## 2013-06-02 DIAGNOSIS — F1920 Other psychoactive substance dependence, uncomplicated: Secondary | ICD-10-CM | POA: Diagnosis present

## 2013-06-02 DIAGNOSIS — R109 Unspecified abdominal pain: Secondary | ICD-10-CM

## 2013-06-02 DIAGNOSIS — F10939 Alcohol use, unspecified with withdrawal, unspecified: Secondary | ICD-10-CM

## 2013-06-02 DIAGNOSIS — K859 Acute pancreatitis without necrosis or infection, unspecified: Secondary | ICD-10-CM | POA: Diagnosis present

## 2013-06-02 DIAGNOSIS — Z8249 Family history of ischemic heart disease and other diseases of the circulatory system: Secondary | ICD-10-CM | POA: Diagnosis not present

## 2013-06-02 DIAGNOSIS — Z903 Acquired absence of stomach [part of]: Secondary | ICD-10-CM | POA: Diagnosis not present

## 2013-06-02 DIAGNOSIS — R112 Nausea with vomiting, unspecified: Secondary | ICD-10-CM | POA: Diagnosis present

## 2013-06-02 LAB — CBC
HEMATOCRIT: 33.8 % — AB (ref 36.0–46.0)
Hemoglobin: 11.2 g/dL — ABNORMAL LOW (ref 12.0–15.0)
MCH: 30.2 pg (ref 26.0–34.0)
MCHC: 33.1 g/dL (ref 30.0–36.0)
MCV: 91.1 fL (ref 78.0–100.0)
Platelets: 113 10*3/uL — ABNORMAL LOW (ref 150–400)
RBC: 3.71 MIL/uL — ABNORMAL LOW (ref 3.87–5.11)
RDW: 14.7 % (ref 11.5–15.5)
WBC: 5.9 10*3/uL (ref 4.0–10.5)

## 2013-06-02 LAB — BASIC METABOLIC PANEL
BUN: 8 mg/dL (ref 6–23)
CO2: 18 mEq/L — ABNORMAL LOW (ref 19–32)
CREATININE: 0.62 mg/dL (ref 0.50–1.10)
Calcium: 8.1 mg/dL — ABNORMAL LOW (ref 8.4–10.5)
Chloride: 102 mEq/L (ref 96–112)
GFR calc Af Amer: >90 mL/min (ref >90)
GFR calc non Af Amer: >90 mL/min (ref >90)
Glucose, Bld: 187 mg/dL — ABNORMAL HIGH (ref 70–99)
POTASSIUM: 3.3 meq/L — AB (ref 3.7–5.3)
Sodium: 139 mEq/L (ref 137–147)

## 2013-06-02 LAB — LACTIC ACID, PLASMA
LACTIC ACID, VENOUS: 1.4 mmol/L (ref 0.5–2.2)
Lactic Acid, Venous: 6.8 mmol/L — ABNORMAL HIGH (ref 0.5–2.2)

## 2013-06-02 LAB — MRSA PCR SCREENING: MRSA by PCR: NEGATIVE

## 2013-06-02 MED ORDER — NICOTINE 21 MG/24HR TD PT24
21.0000 mg | MEDICATED_PATCH | Freq: Every day | TRANSDERMAL | Status: DC
  Administered 2013-06-02 – 2013-06-06 (×5): 21 mg via TRANSDERMAL
  Filled 2013-06-02 (×5): qty 1

## 2013-06-02 MED ORDER — THIAMINE HCL 100 MG/ML IJ SOLN
100.0000 mg | Freq: Every day | INTRAMUSCULAR | Status: DC
  Filled 2013-06-02 (×4): qty 1

## 2013-06-02 MED ORDER — VITAMIN B-1 100 MG PO TABS
100.0000 mg | ORAL_TABLET | Freq: Every day | ORAL | Status: DC
  Administered 2013-06-03 – 2013-06-06 (×4): 100 mg via ORAL
  Filled 2013-06-02 (×4): qty 1

## 2013-06-02 MED ORDER — ADULT MULTIVITAMIN W/MINERALS CH
1.0000 | ORAL_TABLET | Freq: Every day | ORAL | Status: DC
  Administered 2013-06-02 – 2013-06-06 (×4): 1 via ORAL
  Filled 2013-06-02 (×5): qty 1

## 2013-06-02 MED ORDER — SODIUM CHLORIDE 0.9 % IV SOLN
INTRAVENOUS | Status: DC
  Administered 2013-06-02 – 2013-06-05 (×6): via INTRAVENOUS

## 2013-06-02 MED ORDER — IOHEXOL 350 MG/ML SOLN
100.0000 mL | Freq: Once | INTRAVENOUS | Status: AC | PRN
  Administered 2013-06-02: 100 mL via INTRAVENOUS

## 2013-06-02 MED ORDER — LABETALOL HCL 5 MG/ML IV SOLN
10.0000 mg | INTRAVENOUS | Status: DC | PRN
  Administered 2013-06-02: 10 mg via INTRAVENOUS
  Filled 2013-06-02: qty 4

## 2013-06-02 MED ORDER — HYDROMORPHONE HCL PF 1 MG/ML IJ SOLN
1.0000 mg | INTRAMUSCULAR | Status: DC | PRN
  Administered 2013-06-02 – 2013-06-03 (×12): 1 mg via INTRAVENOUS
  Filled 2013-06-02 (×12): qty 1

## 2013-06-02 MED ORDER — SODIUM CHLORIDE 0.9 % IJ SOLN
3.0000 mL | Freq: Two times a day (BID) | INTRAMUSCULAR | Status: DC
  Administered 2013-06-02 – 2013-06-06 (×5): 3 mL via INTRAVENOUS

## 2013-06-02 MED ORDER — IPRATROPIUM BROMIDE 0.02 % IN SOLN
0.5000 mg | RESPIRATORY_TRACT | Status: DC | PRN
  Administered 2013-06-03: 0.5 mg via RESPIRATORY_TRACT
  Filled 2013-06-02: qty 2.5

## 2013-06-02 MED ORDER — HEPARIN SODIUM (PORCINE) 5000 UNIT/ML IJ SOLN
5000.0000 [IU] | Freq: Three times a day (TID) | INTRAMUSCULAR | Status: DC
  Administered 2013-06-02 – 2013-06-06 (×13): 5000 [IU] via SUBCUTANEOUS
  Filled 2013-06-02 (×16): qty 1

## 2013-06-02 MED ORDER — PROMETHAZINE HCL 25 MG/ML IJ SOLN
12.5000 mg | Freq: Four times a day (QID) | INTRAMUSCULAR | Status: DC | PRN
  Administered 2013-06-02 – 2013-06-03 (×5): 12.5 mg via INTRAVENOUS
  Administered 2013-06-04: 17:00:00 via INTRAVENOUS
  Administered 2013-06-04 – 2013-06-06 (×7): 12.5 mg via INTRAVENOUS
  Filled 2013-06-02 (×13): qty 1

## 2013-06-02 MED ORDER — LABETALOL HCL 5 MG/ML IV SOLN
10.0000 mg | INTRAVENOUS | Status: DC | PRN
  Administered 2013-06-03 – 2013-06-04 (×3): 10 mg via INTRAVENOUS
  Filled 2013-06-02 (×4): qty 4

## 2013-06-02 MED ORDER — FOLIC ACID 5 MG/ML IJ SOLN: 1.0000 mg | Freq: Every day | INTRAMUSCULAR | Status: DC

## 2013-06-02 MED ORDER — HYDROMORPHONE HCL PF 1 MG/ML IJ SOLN
1.0000 mg | INTRAMUSCULAR | Status: DC | PRN
  Administered 2013-06-02: 1 mg via INTRAVENOUS
  Filled 2013-06-02 (×2): qty 1

## 2013-06-02 MED ORDER — ALBUTEROL SULFATE (2.5 MG/3ML) 0.083% IN NEBU
2.5000 mg | INHALATION_SOLUTION | RESPIRATORY_TRACT | Status: DC | PRN
  Administered 2013-06-02 – 2013-06-03 (×3): 2.5 mg via RESPIRATORY_TRACT
  Filled 2013-06-02 (×3): qty 3

## 2013-06-02 MED ORDER — BIOTENE DRY MOUTH MT LIQD
15.0000 mL | Freq: Two times a day (BID) | OROMUCOSAL | Status: DC
  Administered 2013-06-02 – 2013-06-05 (×7): 15 mL via OROMUCOSAL

## 2013-06-02 MED ORDER — ONDANSETRON HCL 4 MG/2ML IJ SOLN
INTRAMUSCULAR | Status: AC
  Administered 2013-06-02: 4 mg via INTRAVENOUS
  Filled 2013-06-02: qty 2

## 2013-06-02 MED ORDER — LORAZEPAM 2 MG/ML IJ SOLN
2.0000 mg | INTRAMUSCULAR | Status: DC | PRN
  Administered 2013-06-02: 2 mg via INTRAVENOUS
  Administered 2013-06-02 (×3): 3 mg via INTRAVENOUS
  Administered 2013-06-02 – 2013-06-04 (×14): 2 mg via INTRAVENOUS
  Administered 2013-06-04: 3 mg via INTRAVENOUS
  Administered 2013-06-04 – 2013-06-06 (×10): 2 mg via INTRAVENOUS
  Filled 2013-06-02 (×8): qty 1
  Filled 2013-06-02: qty 2
  Filled 2013-06-02: qty 1
  Filled 2013-06-02: qty 2
  Filled 2013-06-02: qty 1
  Filled 2013-06-02: qty 2
  Filled 2013-06-02 (×10): qty 1
  Filled 2013-06-02 (×3): qty 2
  Filled 2013-06-02 (×4): qty 1

## 2013-06-02 MED ORDER — ONDANSETRON HCL 4 MG/2ML IJ SOLN
4.0000 mg | Freq: Four times a day (QID) | INTRAMUSCULAR | Status: DC | PRN
  Administered 2013-06-02 – 2013-06-05 (×8): 4 mg via INTRAVENOUS
  Filled 2013-06-02 (×7): qty 2

## 2013-06-02 MED ORDER — FOLIC ACID 1 MG PO TABS
1.0000 mg | ORAL_TABLET | Freq: Every day | ORAL | Status: DC
  Administered 2013-06-02 – 2013-06-06 (×5): 1 mg via ORAL
  Filled 2013-06-02 (×5): qty 1

## 2013-06-02 MED ORDER — CHLORHEXIDINE GLUCONATE 0.12 % MT SOLN
15.0000 mL | Freq: Two times a day (BID) | OROMUCOSAL | Status: DC
  Administered 2013-06-02 – 2013-06-06 (×8): 15 mL via OROMUCOSAL
  Filled 2013-06-02 (×12): qty 15

## 2013-06-02 MED ORDER — POTASSIUM CHLORIDE CRYS ER 20 MEQ PO TBCR
40.0000 meq | EXTENDED_RELEASE_TABLET | Freq: Four times a day (QID) | ORAL | Status: AC
  Administered 2013-06-02 (×2): 40 meq via ORAL
  Filled 2013-06-02 (×2): qty 2

## 2013-06-02 MED ORDER — HYDROMORPHONE HCL PF 1 MG/ML IJ SOLN
1.0000 mg | Freq: Once | INTRAMUSCULAR | Status: AC
  Administered 2013-06-02: 1 mg via INTRAVENOUS

## 2013-06-02 MED ORDER — HYDROMORPHONE HCL PF 1 MG/ML IJ SOLN: 1.0000 mg | INTRAMUSCULAR | Status: DC | PRN

## 2013-06-02 NOTE — Progress Notes (Signed)
Pt with elevated bp. MD made aware. New order given for labetalol. Pt also has had little to no urine output. Bladder scanned. 491 obtained on bladder scanner. MD also made aware. Order given for foley catheter. Catheter placed. Vwilliams, rn.

## 2013-06-02 NOTE — Progress Notes (Addendum)
Patient seen earlier today by my colleague Dr. Alcario Drought. Patient seen and examined, and data base reviewed. Admitted to the hospital with nausea, vomiting and abdominal pain. 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 Started on CIWA protocol, also narcotics for pain control. Follow patient closely, be cautious because of concentration of benzodiazepines and narcotics increase adverse effects. Lactate elevated at 4.13, patient has abdominal pain, wall rule out intra-abdominal ischemia. CT angio of abdomen.  Birdie Hopes Pager: [phone] 06/02/2013, 12:58 PM

## 2013-06-02 NOTE — H&P (Signed)
[facility] History and Physical  [name] BJS:[id number] DOB: 1950-08-12 DOA: 06/01/2013  Referring physician: EDP PCP: Ricke Hey, MD   Chief Complaint: Abdominal pain   HPI: [name] is a 63 y.o. female with h/o chronic EtOH pancreatitis who presents to ED with abdominal pain.  Pain is located in her epigastric area, severe, associated with N/V and as a result she vomited up her PO dilaudid.  It has been going on for 2 days.  It is so severe that she had to stop drinking EtOH yesterday.  Unfortunately, patient has an extensive history of severe withdrawal symptoms with EtOH cessation, and she is already in fairly severe withdrawals in the ED.  Review of Systems: Systems reviewed.  As above, otherwise negative  Past Medical History  Diagnosis Date  . Pancreatitis   . COPD (chronic obstructive pulmonary disease)   . Substance abuse   . Cancer     renal ca  . Seizures   . Pancreatitis    Past Surgical History  Procedure Laterality Date  . Partial gastrectomy     Social History:  reports that she has been smoking Cigarettes.  She has been smoking about 0.50 packs per day. She has never used smokeless tobacco. She reports that she drinks alcohol. She reports that she uses illicit drugs.  Allergies  Allergen Reactions  . Aspirin Other (See Comments)    bleeding  . Chlorpromazine Hcl Other (See Comments)    Muscle spasms    Family History  Problem Relation Age of Onset  . CAD Mother   . Alcoholism Father      Prior to Admission medications   Medication Sig Start Date End Date Taking? Authorizing Provider  HYDROmorphone (DILAUDID) 4 MG tablet Take 1 tablet (4 mg total) by mouth every 4 (four) hours as needed for severe pain. 04/01/13  Yes Estela Leonie Green, MD  LORazepam (ATIVAN) 1 MG tablet Take 1 tablet (1 mg total) by mouth every 6 (six) hours as needed. For anxiety. 04/01/13  Yes Erline Hau, MD  ondansetron (ZOFRAN) 8  MG tablet Take 8 mg by mouth every 8 (eight) hours as needed for nausea or vomiting.   Yes Historical Provider, MD  fluticasone (FLOVENT HFA) 110 MCG/ACT inhaler Inhale 2 puffs into the lungs 2 (two) times daily.    Historical Provider, MD   Physical Exam: Filed Vitals:   06/02/13 0029  BP: 128/72  Pulse: 121  Temp:   Resp:     BP 128/72  Pulse 121  Temp(Src) 98.5 F (36.9 C) (Oral)  Resp 21  SpO2 100%  General Appearance:    Alert, oriented, anxious, tremors, appears stated age  Head:    Normocephalic, atraumatic  Eyes:    PERRL, EOMI, sclera non-icteric        Nose:   Nares without drainage or epistaxis. Mucosa, turbinates normal  Throat:   Moist mucous membranes. Oropharynx without erythema or exudate.  Neck:   Supple. No carotid bruits.  No thyromegaly.  No lymphadenopathy.   Back:     No CVA tenderness, no spinal tenderness  Lungs:     Clear to auscultation bilaterally, without wheezes, rhonchi or rales  Chest wall:    No tenderness to palpitation  Heart:    Tachycardia Regular rhythm without murmurs, gallops, rubs  Abdomen:     Soft, non-tender, nondistended, normal bowel sounds, no organomegaly  Genitalia:    deferred  Rectal:    deferred  Extremities:   No clubbing, cyanosis or edema.  Pulses:   2+ and symmetric all extremities  Skin:   Skin color, texture, turgor normal, no rashes or lesions  Lymph nodes:   Cervical, supraclavicular, and axillary nodes normal  Neurologic:   CNII-XII intact. Normal strength, sensation and reflexes      throughout    Labs on Admission:  Basic Metabolic Panel:  Recent Labs Lab 06/01/13 2100  NA 135*  K 3.5*  CL 95*  CO2 22  GLUCOSE 124*  BUN 5*  CREATININE 0.63  CALCIUM 9.8   Liver Function Tests:  Recent Labs Lab 06/01/13 2100  AST 45*  ALT 37*  ALKPHOS 123*  BILITOT 0.3  PROT 8.2  ALBUMIN 3.8    Recent Labs Lab 06/01/13 2100  LIPASE 31   No results found for this basename: AMMONIA,  in the last 168  hours CBC:  Recent Labs Lab 06/01/13 2209  WBC 4.7  HGB 12.9  HCT 37.3  MCV 88.0  PLT 143*   Cardiac Enzymes: No results found for this basename: CKTOTAL, CKMB, CKMBINDEX, TROPONINI,  in the last 168 hours  BNP (last 3 results) No results found for this basename: PROBNP,  in the last 8760 hours CBG: No results found for this basename: GLUCAP,  in the last 168 hours  Radiological Exams on Admission: Dg Chest Portable 1 View  06/01/2013   CLINICAL DATA:  63 year old female shortness of breath. Cough. Initial encounter.  EXAM: PORTABLE CHEST - 1 VIEW  COMPARISON:  03/28/2013 and earlier.  FINDINGS: Portable AP semi upright view at 2124 hrs. Stable large lung volumes. Stable cardiac size and mediastinal contours. Visualized tracheal air column is within normal limits. Stable mild increased interstitial markings with no pneumothorax, pulmonary edema, pleural effusion or acute pulmonary opacity.  IMPRESSION: Stable chronic lung disease. No superimposed acute findings are identified.   Electronically Signed   By: Lars Pinks M.D.   On: 06/01/2013 21:41    EKG: Independently reviewed.  Assessment/Plan Principal Problem:   DTs (delirium tremens) Active Problems:   PANCREATITIS, CHRONIC   Abdominal pain, acute   Chronic alcoholic pancreatitis   1. DTs - patient is clearly having fairly severe withdrawal symptoms after stopping drinking for just 24 hours.  Despite 4mg  IV ativan less than 1 hour prior to my evaluating her (and this is on top of the 2mg  IV dilaudid she has already received in the ED), she is still very tremulous, severely anxious, having tachycardia up to the 140s and hasnt demonstrated a heart rate below 120.  Due to severity of symptoms will start patient out in the SDU for the SDU CIWA protocol.  Though if her symptoms worsen at all, she may very well require conversion to ICU status. 2. Chronic EtOH pancreatitis - possibly causing patients abdominal pain, will go ahead and  keep her NPO except ice chips, hydrate with NS, her lipase was WNL this time at 32 (unlike previous acute pancreatitis admits this year).  Will order dilaudid for pain control but please use sparingly when on high doses of ativan!   Code Status: Full Code  Family Communication: No family in room Disposition Plan: Admit to SDU   Time spent: 87 min  [facility] Pager [phone]  If 7AM-7PM, please contact the day team taking care of the patient [email or web address] Password [facility] 06/02/2013, 12:35 AM

## 2013-06-03 DIAGNOSIS — F10231 Alcohol dependence with withdrawal delirium: Secondary | ICD-10-CM

## 2013-06-03 DIAGNOSIS — F411 Generalized anxiety disorder: Secondary | ICD-10-CM

## 2013-06-03 DIAGNOSIS — F10931 Alcohol use, unspecified with withdrawal delirium: Secondary | ICD-10-CM

## 2013-06-03 DIAGNOSIS — I10 Essential (primary) hypertension: Secondary | ICD-10-CM

## 2013-06-03 DIAGNOSIS — K859 Acute pancreatitis without necrosis or infection, unspecified: Principal | ICD-10-CM

## 2013-06-03 LAB — COMPREHENSIVE METABOLIC PANEL
ALT: 60 U/L — ABNORMAL HIGH (ref 0–35)
AST: 77 U/L — ABNORMAL HIGH (ref 0–37)
Albumin: 3.1 g/dL — ABNORMAL LOW (ref 3.5–5.2)
Alkaline Phosphatase: 108 U/L (ref 39–117)
BUN: 7 mg/dL (ref 6–23)
CALCIUM: 8.9 mg/dL (ref 8.4–10.5)
CO2: 21 meq/L (ref 19–32)
Chloride: 107 mEq/L (ref 96–112)
Creatinine, Ser: 0.55 mg/dL (ref 0.50–1.10)
GLUCOSE: 75 mg/dL (ref 70–99)
Potassium: 4 mEq/L (ref 3.7–5.3)
Sodium: 142 mEq/L (ref 137–147)
Total Bilirubin: 0.3 mg/dL (ref 0.3–1.2)
Total Protein: 6.2 g/dL (ref 6.0–8.3)

## 2013-06-03 MED ORDER — HYDROMORPHONE HCL 4 MG PO TABS
4.0000 mg | ORAL_TABLET | ORAL | Status: DC | PRN
  Administered 2013-06-04 – 2013-06-05 (×8): 4 mg via ORAL
  Filled 2013-06-03: qty 1
  Filled 2013-06-03 (×3): qty 2
  Filled 2013-06-03 (×5): qty 1

## 2013-06-03 MED ORDER — HYDROMORPHONE HCL PF 1 MG/ML IJ SOLN
1.0000 mg | INTRAMUSCULAR | Status: DC | PRN
  Administered 2013-06-03 – 2013-06-06 (×13): 1 mg via INTRAVENOUS
  Filled 2013-06-03 (×14): qty 1

## 2013-06-03 MED ORDER — IPRATROPIUM-ALBUTEROL 0.5-2.5 (3) MG/3ML IN SOLN
3.0000 mL | Freq: Four times a day (QID) | RESPIRATORY_TRACT | Status: DC
  Administered 2013-06-03 – 2013-06-04 (×5): 3 mL via RESPIRATORY_TRACT
  Filled 2013-06-03 (×5): qty 3

## 2013-06-03 NOTE — Progress Notes (Signed)
[facility] PROGRESS NOTE   TEMPIE GIBEAULT MWN:[id number] DOB: Aug 08, 1950 DOA: 06/01/2013 PCP: Ricke Hey, MD  HPI/Subjective: Continue to complain about pain, less shakiness.  Assessment/Plan: Principal Problem:   DTs (delirium tremens) Active Problems:   PANCREATITIS, CHRONIC   Abdominal pain, acute   Chronic alcoholic pancreatitis   Lactic acidosis    Alcohol withdrawal -Patient was complaining about nausea and vomiting for several days prior to admission. -No alcohol consumption for 3-4 days prior to admission, came in with alcohol withdrawal. -Patient on CIWA protocol, continue. -Feels much better today, denies any anxiety.  Acute abdominal pain -Likely acute on chronic pancreatitis, cannot rule out alcoholic gastritis/hepatitis. -Patient has transaminitis, we'll check for acute hepatitis. -CT abdomen angiography showed no evidence of ischemia. -Pain is better, still n.p.o. continue to ice chips.  Elevated lactate -With abdominal pain lactate went up to 6.8, CT angio was obtained and showed no abnormalities. -Elevated lactate is likely secondary to dehydration.  Chronic abdominal pain/narcotic dependence -Patient is on oral Dilaudid at home, will restart oral medications.  Alcohol abuse/nicotine addiction -Patient counseled extensively about both. -Nicotine patch, patient is on CIWA protocol for alcohol withdrawal.  Elevated blood pressure -Patient denies any history of hypertension, blood pressure went up to 209/129. -This could be secondary to pain or withdrawal from alcohol, treat with when necessary labetalol for now.  Code Status: Full code Family Communication: Plan discussed with the patient. Disposition Plan: Remains inpatient   Consultants:  None  Procedures:  None  Antibiotics:  None   Objective: Filed Vitals:   06/03/13 1000  BP: 150/91  Pulse: 64  Temp:   Resp:     Intake/Output Summary (Last 24 hours) at  06/03/13 1025 Last data filed at 06/03/13 1015  Gross per 24 hour  Intake   2756 ml  Output   3100 ml  Net   -344 ml   Filed Weights   06/02/13 0145  Weight: 40.2 kg (88 lb 10 oz)    Exam: General: Alert and awake, oriented x3, not in any acute distress. HEENT: anicteric sclera, pupils reactive to light and accommodation, EOMI CVS: S1-S2 clear, no murmur rubs or gallops Chest: clear to auscultation bilaterally, no wheezing, rales or rhonchi Abdomen: soft nontender, nondistended, normal bowel sounds, no organomegaly Extremities: no cyanosis, clubbing or edema noted bilaterally Neuro: Cranial nerves II-XII intact, no focal neurological deficits  Data Reviewed: Basic Metabolic Panel:  Recent Labs Lab 06/01/13 2100 06/02/13 0350 06/03/13 0810  NA 135* 139 142  K 3.5* 3.3* 4.0  CL 95* 102 107  CO2 22 18* 21  GLUCOSE 124* 187* 75  BUN 5* 8 7  CREATININE 0.63 0.62 0.55  CALCIUM 9.8 8.1* 8.9   Liver Function Tests:  Recent Labs Lab 06/01/13 2100 06/03/13 0810  AST 45* 77*  ALT 37* 60*  ALKPHOS 123* 108  BILITOT 0.3 0.3  PROT 8.2 6.2  ALBUMIN 3.8 3.1*    Recent Labs Lab 06/01/13 2100  LIPASE 31   No results found for this basename: AMMONIA,  in the last 168 hours CBC:  Recent Labs Lab 06/01/13 2209 06/02/13 0350  WBC 4.7 5.9  HGB 12.9 11.2*  HCT 37.3 33.8*  MCV 88.0 91.1  PLT 143* 113*   Cardiac Enzymes: No results found for this basename: CKTOTAL, CKMB, CKMBINDEX, TROPONINI,  in the last 168 hours BNP (last 3 results) No results found for this basename: PROBNP,  in the last 8760 hours CBG: No results found for this  basename: GLUCAP,  in the last 168 hours  Micro Recent Results (from the past 240 hour(s))  MRSA PCR SCREENING     Status: None   Collection Time    06/02/13  1:46 AM      Result Value Ref Range Status   MRSA by PCR NEGATIVE  NEGATIVE Final   Comment:            The GeneXpert MRSA Assay (FDA     approved for NASAL specimens      only), is one component of a     comprehensive MRSA colonization     surveillance program. It is not     intended to diagnose MRSA     infection nor to guide or     monitor treatment for     MRSA infections.     Studies: Dg Chest Portable 1 View  06/01/2013   CLINICAL DATA:  63 year old female shortness of breath. Cough. Initial encounter.  EXAM: PORTABLE CHEST - 1 VIEW  COMPARISON:  03/28/2013 and earlier.  FINDINGS: Portable AP semi upright view at 2124 hrs. Stable large lung volumes. Stable cardiac size and mediastinal contours. Visualized tracheal air column is within normal limits. Stable mild increased interstitial markings with no pneumothorax, pulmonary edema, pleural effusion or acute pulmonary opacity.  IMPRESSION: Stable chronic lung disease. No superimposed acute findings are identified.   Electronically Signed   By: Lars Pinks M.D.   On: 06/01/2013 21:41   Ct Angio Abd/pel W/ And/or W/o  06/02/2013   CLINICAL DATA:  Abdominal pain, elevated lactate  EXAM: CTA ABDOMEN AND PELVIS wITHOUT AND WITH CONTRAST  TECHNIQUE: Multidetector CT imaging of the abdomen and pelvis was performed using the standard protocol during bolus administration of intravenous contrast. Multiplanar reconstructed images and MIPs were obtained and reviewed to evaluate the vascular anatomy.  CONTRAST:  1107mL OMNIPAQUE IOHEXOL 350 MG/ML SOLN  COMPARISON:  02/25/2011  FINDINGS: The lung bases are clear.  The liver demonstrates no focal abnormality. There is mild periportal edema. No intrahepatic biliary ductal dilatation. Mild common bile duct dilatation measuring up to 8 mm. The gallbladder is normal. The spleen demonstrates no focal abnormality.There post radiofrequency ablation changes in the inferior pole of the left kidney which are unchanged compared with 02/25/2011. There are no findings to suggest recurrent or residual malignancy. The right kidney, adrenal glands and pancreas are normal. The bladder is unremarkable.   Small hiatal hernia. The stomach, duodenum, small intestine, and large intestine demonstrate no gross abnormality, but evaluation is limited secondary to lack of enteric contrast. There is no pneumoperitoneum, pneumatosis, or portal venous gas. There is a small amount of pelvic free fluid. There is no lymphadenopathy.  The abdominal aorta is normal in caliber with atherosclerosis. The celiac artery, mesenteric artery and inferior mesenteric arteries are patent. The left renal artery is patent. There is atherosclerotic plaque at the origin of the right renal artery which is limited in evaluation secondary to motion artifact.  There are no lytic or sclerotic osseous lesions. Lumbar spine spondylosis most significant at L5-S1. Mild broad-based disc bulges throughout the lumbar spine most prominent at L5-S1. Left proximal femoral intra medullary nail and cannulated femoral neck screw transfixing a healed intertrochanteric fracture.  Review of the MIP images confirms the above findings.  IMPRESSION: 1. The celiac artery common superior mesenteric artery and inferior mesenteric arteries are patent. 2. No findings to suggest ischemic bowel. 3. Post ablation changes in the inferior pole of the left kidney  without recurrent or residual neoplasm.   Electronically Signed   By: Kathreen Devoid   On: 06/02/2013 18:36    Scheduled Meds: . antiseptic oral rinse  15 mL Mouth Rinse q12n4p  . chlorhexidine  15 mL Mouth Rinse BID  . folic acid  1 mg Oral Daily  . heparin  5,000 Units Subcutaneous 3 times per day  . multivitamin with minerals  1 tablet Oral Daily  . nicotine  21 mg Transdermal Daily  . sodium chloride  3 mL Intravenous Q12H  . thiamine  100 mg Oral Daily   Or  . thiamine IV  100 mg Intravenous Daily   Continuous Infusions: . sodium chloride 125 mL/hr at 06/03/13 0358       Time spent: 35 minutes    Verlee Monte  [facility] Pager [phone] If 7PM-7AM, please contact night-coverage at  [email or web address], password [facility] 06/03/2013, 10:25 AM  LOS: 2 days

## 2013-06-04 DIAGNOSIS — K7460 Unspecified cirrhosis of liver: Secondary | ICD-10-CM

## 2013-06-04 DIAGNOSIS — F19939 Other psychoactive substance use, unspecified with withdrawal, unspecified: Secondary | ICD-10-CM

## 2013-06-04 DIAGNOSIS — F1520 Other stimulant dependence, uncomplicated: Secondary | ICD-10-CM

## 2013-06-04 LAB — COMPREHENSIVE METABOLIC PANEL
ALBUMIN: 2.9 g/dL — AB (ref 3.5–5.2)
ALK PHOS: 101 U/L (ref 39–117)
ALT: 46 U/L — AB (ref 0–35)
AST: 38 U/L — ABNORMAL HIGH (ref 0–37)
BUN: 6 mg/dL (ref 6–23)
CALCIUM: 8.6 mg/dL (ref 8.4–10.5)
CO2: 26 mEq/L (ref 19–32)
Chloride: 103 mEq/L (ref 96–112)
Creatinine, Ser: 0.63 mg/dL (ref 0.50–1.10)
GFR calc Af Amer: >90 mL/min (ref >90)
GFR calc non Af Amer: >90 mL/min (ref >90)
Glucose, Bld: 88 mg/dL (ref 70–99)
POTASSIUM: 2.6 meq/L — AB (ref 3.7–5.3)
SODIUM: 141 meq/L (ref 137–147)
TOTAL PROTEIN: 5.9 g/dL — AB (ref 6.0–8.3)
Total Bilirubin: 0.2 mg/dL — ABNORMAL LOW (ref 0.3–1.2)

## 2013-06-04 LAB — PROTIME-INR
INR: 0.94 (ref 0.00–1.49)
Prothrombin Time: 12.4 seconds (ref 11.6–15.2)

## 2013-06-04 LAB — HEPATITIS PANEL, ACUTE
HCV AB: NEGATIVE
HEP A IGM: NONREACTIVE
Hep B C IgM: NONREACTIVE
Hepatitis B Surface Ag: NEGATIVE

## 2013-06-04 LAB — POTASSIUM: Potassium: 3.5 mEq/L — ABNORMAL LOW (ref 3.7–5.3)

## 2013-06-04 MED ORDER — LISINOPRIL 10 MG PO TABS
10.0000 mg | ORAL_TABLET | Freq: Every day | ORAL | Status: DC
  Administered 2013-06-04 – 2013-06-06 (×3): 10 mg via ORAL
  Filled 2013-06-04 (×3): qty 1

## 2013-06-04 MED ORDER — IPRATROPIUM-ALBUTEROL 0.5-2.5 (3) MG/3ML IN SOLN
3.0000 mL | Freq: Four times a day (QID) | RESPIRATORY_TRACT | Status: DC
  Administered 2013-06-05 (×2): 3 mL via RESPIRATORY_TRACT
  Filled 2013-06-04 (×2): qty 3

## 2013-06-04 MED ORDER — POTASSIUM CHLORIDE 10 MEQ/100ML IV SOLN
10.0000 meq | INTRAVENOUS | Status: AC
  Administered 2013-06-04 (×5): 10 meq via INTRAVENOUS
  Filled 2013-06-04 (×5): qty 100

## 2013-06-04 MED ORDER — POLYETHYLENE GLYCOL 3350 17 G PO PACK
17.0000 g | PACK | Freq: Every day | ORAL | Status: DC
  Administered 2013-06-04: 17 g via ORAL
  Filled 2013-06-04 (×3): qty 1

## 2013-06-04 MED ORDER — BOOST / RESOURCE BREEZE PO LIQD
1.0000 | Freq: Three times a day (TID) | ORAL | Status: DC
  Administered 2013-06-04 (×2): 1 via ORAL

## 2013-06-04 MED ORDER — HYDRALAZINE HCL 20 MG/ML IJ SOLN
10.0000 mg | Freq: Once | INTRAMUSCULAR | Status: AC
  Administered 2013-06-04: 10 mg via INTRAVENOUS
  Filled 2013-06-04: qty 1

## 2013-06-04 NOTE — Progress Notes (Signed)
BP elevated despite prn labetalol.  MD notified and orders received, will continue to monitor.

## 2013-06-04 NOTE — Progress Notes (Signed)
INITIAL NUTRITION ASSESSMENT  DOCUMENTATION CODES Per approved criteria  -Severe malnutrition in the context of chronic illness -Underweight   Pt meets criteria for severe MALNUTRITION in the context of chronic illness as evidenced by severe depletion of muscle mass and 8.7% weight loss x 4 months.   INTERVENTION:  Resource Breeze TID (orange), each supplement providing 250 kcals and 9 grams of protein  Continue multivitamin with minerals  Diet advancement per MD   Monitor magnesium, potassium, and phosphorus daily for at least 3 days or throughout nutrition repletion, MD to replete as needed, as pt is at risk for refeeding syndrome given severe malnutrition.   RD to continue to follow nutrition care plan   NUTRITION DIAGNOSIS: Inadequate oral intake related to nausea, abdominal pain, ETOH abuse as evidenced by 0% intake x 3 days.   Goal: Pt to meet >/= 90% of estimated nutrition needs  Monitor:  Diet advancement, GI profile, supplement acceptance, weight trend, labs   Reason for Assessment: Low BMI   63 y.o. female  Admitting Dx: DTs (delirium tremens)  ASSESSMENT: Patient is a 63 y.o. female with h/o chronic EtOH pancreatitis who presents to ED with abdominal pain. Pain is located in her epigastric area, severe, associated with N/V and as a result she vomited up her PO dilaudid. It has been going on for 2 days. It is so severe that she had to stop drinking EtOH one day PTA.  - Pt states that she has recently lost weight (usual weight is 110 lbs). She last weighed 110 lbs 6 months ago, per pt  - Per Epic chart, pt has lost 8.7% in 4 months, which is severe for time frame  - Pt states she has not eaten anything in 3 days, and prior to this she was only eating 1 meal/day from [facility] (chicken and slaw)  - Pt states that she is still nauseous today and has abdominal pain   Nutrition Focused Physical Exam:  Subcutaneous Fat:  Orbital Region: mild depletion  Upper Arm Region:  mild-moderate depletion  Thoracic and Lumbar Region: n/a   Muscle:  Temple Region: mild depletion Clavicle Bone Region: severe depletion  Clavicle and Acromion Bone Region: severe depletion  Scapular Bone Region: severe depletion  Dorsal Hand: mild depletion  Patellar Region: severe depletion  Anterior Thigh Region: severe depletion  Posterior Calf Region: severe depletion   Edema: none   Low potassium   Height: Ht Readings from Last 1 Encounters:  06/02/13 5\' 3"  (1.6 m)    Weight: Wt Readings from Last 1 Encounters:  06/04/13 84 lb 3.5 oz (38.2 kg)    Ideal Body Weight: 52.3 kg   % Ideal Body Weight: 73%   Wt Readings from Last 10 Encounters:  06/04/13 84 lb 3.5 oz (38.2 kg)  04/01/13 84 lb 4.8 oz (38.238 kg)  03/11/13 88 lb 8 oz (40.143 kg)  02/09/13 92 lb 9.5 oz (42 kg)  07/14/11 88 lb (39.917 kg)  02/25/11 203 lb 11.3 oz (92.4 kg)  12/02/10 145 lb 3 oz (65.857 kg)  08/02/10 80 lb (36.288 kg)  04/08/10 82 lb (37.195 kg)  10/18/08 95 lb 6.4 oz (43.273 kg)    Usual Body Weight: 110 lbs, per pt   % Usual Body Weight: 76%   BMI:  Body mass index is 14.92 kg/(m^2)., Underweight   Estimated Nutritional Needs: Kcal: 1400 - 1600  Protein: 60 - 75 grams  Fluid: >/= 1.4 L/day   Skin: WDL   Diet Order:  Clear Liquid  EDUCATION NEEDS: -No education needs identified at this time   Intake/Output Summary (Last 24 hours) at 06/04/13 1108 Last data filed at 06/04/13 1044  Gross per 24 hour  Intake   1653 ml  Output   2885 ml  Net  -1232 ml    Last BM: 5/29    Labs:   Recent Labs Lab 06/02/13 0350 06/03/13 0810 06/04/13 0342  NA 139 142 141  K 3.3* 4.0 2.6*  CL 102 107 103  CO2 18* 21 26  BUN 8 7 6   CREATININE 0.62 0.55 0.63  CALCIUM 8.1* 8.9 8.6  GLUCOSE 187* 75 88    CBG (last 3)  No results found for this basename: GLUCAP,  in the last 72 hours  Scheduled Meds: . antiseptic oral rinse  15 mL Mouth Rinse q12n4p  . chlorhexidine  15 mL  Mouth Rinse BID  . folic acid  1 mg Oral Daily  . heparin  5,000 Units Subcutaneous 3 times per day  . ipratropium-albuterol  3 mL Nebulization Q6H  . lisinopril  10 mg Oral Daily  . multivitamin with minerals  1 tablet Oral Daily  . nicotine  21 mg Transdermal Daily  . polyethylene glycol  17 g Oral Daily  . potassium chloride  10 mEq Intravenous Q1 Hr x 5  . sodium chloride  3 mL Intravenous Q12H  . thiamine  100 mg Oral Daily   Or  . thiamine IV  100 mg Intravenous Daily    Continuous Infusions: . sodium chloride 50 mL/hr at 06/04/13 1008    Past Medical History  Diagnosis Date  . Pancreatitis   . COPD (chronic obstructive pulmonary disease)   . Substance abuse   . Cancer     renal ca  . Seizures   . Pancreatitis     Past Surgical History  Procedure Laterality Date  . Partial gastrectomy      Carrolyn Leigh, BS Dietetic Intern Pager: [phone]

## 2013-06-04 NOTE — Progress Notes (Signed)
[facility] PROGRESS NOTE   DELIAH STREHLOW BOF:[id number] DOB: [birth year] DOA: 06/01/2013 PCP: Ricke Hey, MD  HPI/Subjective: Seen with husband at bedside, state complaining about pain.  Assessment/Plan: Principal Problem:   DTs (delirium tremens) Active Problems:   PANCREATITIS, CHRONIC   Abdominal pain, acute   Chronic alcoholic pancreatitis   Lactic acidosis    Alcohol withdrawal -Patient was complaining about nausea and vomiting for several days prior to admission. -No alcohol consumption for 3-4 days prior to admission, came in with alcohol withdrawal. -Patient on CIWA protocol, continue. -Feels much better today, denies any anxiety.  Acute abdominal pain -Likely acute on chronic pancreatitis, cannot rule out alcoholic gastritis/hepatitis. -Patient has transaminitis, acute hepatitis panel is negative. -CT abdomen angiography showed no evidence of ischemia. -Pain is better, she is n.p.o., will advance her diet to clear liquids.  Elevated lactate -With abdominal pain lactate went up to 6.8, CT angio was obtained and showed no abnormalities. -Elevated lactate is likely secondary to dehydration.  Chronic abdominal pain/narcotic dependence -Patient is on oral Dilaudid at home, will restart oral medications.  Alcohol abuse/nicotine addiction -Patient counseled extensively about both. -Nicotine patch, patient is on CIWA protocol for alcohol withdrawal.  Elevated blood pressure -Patient denies any history of hypertension, blood pressure went up to 209/129. -This could be secondary to pain or withdrawal from alcohol, treat with when necessary labetalol. -And this is consistently high, we'll start her on lisinopril.  Code Status: Full code Family Communication: Plan discussed with the patient. Disposition Plan: Remains inpatient   Consultants:  None  Procedures:  None  Antibiotics:  None   Objective: Filed Vitals:   06/04/13 0600  BP:  183/106  Pulse: 67  Temp:   Resp: 23    Intake/Output Summary (Last 24 hours) at 06/04/13 0731 Last data filed at 06/04/13 7782  Gross per 24 hour  Intake 1571.33 ml  Output   3175 ml  Net -1603.67 ml   Filed Weights   06/02/13 0145 06/04/13 0400  Weight: 40.2 kg (88 lb 10 oz) 38.2 kg (84 lb 3.5 oz)    Exam: General: Alert and awake, oriented x3, not in any acute distress. HEENT: anicteric sclera, pupils reactive to light and accommodation, EOMI CVS: S1-S2 clear, no murmur rubs or gallops Chest: clear to auscultation bilaterally, no wheezing, rales or rhonchi Abdomen: soft nontender, nondistended, normal bowel sounds, no organomegaly Extremities: no cyanosis, clubbing or edema noted bilaterally Neuro: Cranial nerves II-XII intact, no focal neurological deficits  Data Reviewed: Basic Metabolic Panel:  Recent Labs Lab 06/01/13 2100 06/02/13 0350 06/03/13 0810 06/04/13 0342  NA 135* 139 142 141  K 3.5* 3.3* 4.0 2.6*  CL 95* 102 107 103  CO2 22 18* 21 26  GLUCOSE 124* 187* 75 88  BUN 5* 8 7 6   CREATININE 0.63 0.62 0.55 0.63  CALCIUM 9.8 8.1* 8.9 8.6   Liver Function Tests:  Recent Labs Lab 06/01/13 2100 06/03/13 0810 06/04/13 0342  AST 45* 77* 38*  ALT 37* 60* 46*  ALKPHOS 123* 108 101  BILITOT 0.3 0.3 0.2*  PROT 8.2 6.2 5.9*  ALBUMIN 3.8 3.1* 2.9*    Recent Labs Lab 06/01/13 2100  LIPASE 31   No results found for this basename: AMMONIA,  in the last 168 hours CBC:  Recent Labs Lab 06/01/13 2209 06/02/13 0350  WBC 4.7 5.9  HGB 12.9 11.2*  HCT 37.3 33.8*  MCV 88.0 91.1  PLT 143* 113*   Cardiac Enzymes: No results found  for this basename: CKTOTAL, CKMB, CKMBINDEX, TROPONINI,  in the last 168 hours BNP (last 3 results) No results found for this basename: PROBNP,  in the last 8760 hours CBG: No results found for this basename: GLUCAP,  in the last 168 hours  Micro Recent Results (from the past 240 hour(s))  MRSA PCR SCREENING     Status:  None   Collection Time    06/02/13  1:46 AM      Result Value Ref Range Status   MRSA by PCR NEGATIVE  NEGATIVE Final   Comment:            The GeneXpert MRSA Assay (FDA     approved for NASAL specimens     only), is one component of a     comprehensive MRSA colonization     surveillance program. It is not     intended to diagnose MRSA     infection nor to guide or     monitor treatment for     MRSA infections.     Studies: Ct Angio Abd/pel W/ And/or W/o  06/02/2013   CLINICAL DATA:  Abdominal pain, elevated lactate  EXAM: CTA ABDOMEN AND PELVIS wITHOUT AND WITH CONTRAST  TECHNIQUE: Multidetector CT imaging of the abdomen and pelvis was performed using the standard protocol during bolus administration of intravenous contrast. Multiplanar reconstructed images and MIPs were obtained and reviewed to evaluate the vascular anatomy.  CONTRAST:  174mL OMNIPAQUE IOHEXOL 350 MG/ML SOLN  COMPARISON:  02/25/2011  FINDINGS: The lung bases are clear.  The liver demonstrates no focal abnormality. There is mild periportal edema. No intrahepatic biliary ductal dilatation. Mild common bile duct dilatation measuring up to 8 mm. The gallbladder is normal. The spleen demonstrates no focal abnormality.There post radiofrequency ablation changes in the inferior pole of the left kidney which are unchanged compared with 02/25/2011. There are no findings to suggest recurrent or residual malignancy. The right kidney, adrenal glands and pancreas are normal. The bladder is unremarkable.  Small hiatal hernia. The stomach, duodenum, small intestine, and large intestine demonstrate no gross abnormality, but evaluation is limited secondary to lack of enteric contrast. There is no pneumoperitoneum, pneumatosis, or portal venous gas. There is a small amount of pelvic free fluid. There is no lymphadenopathy.  The abdominal aorta is normal in caliber with atherosclerosis. The celiac artery, mesenteric artery and inferior mesenteric  arteries are patent. The left renal artery is patent. There is atherosclerotic plaque at the origin of the right renal artery which is limited in evaluation secondary to motion artifact.  There are no lytic or sclerotic osseous lesions. Lumbar spine spondylosis most significant at L5-S1. Mild broad-based disc bulges throughout the lumbar spine most prominent at L5-S1. Left proximal femoral intra medullary nail and cannulated femoral neck screw transfixing a healed intertrochanteric fracture.  Review of the MIP images confirms the above findings.  IMPRESSION: 1. The celiac artery common superior mesenteric artery and inferior mesenteric arteries are patent. 2. No findings to suggest ischemic bowel. 3. Post ablation changes in the inferior pole of the left kidney without recurrent or residual neoplasm.   Electronically Signed   By: Kathreen Devoid   On: 06/02/2013 18:36    Scheduled Meds: . antiseptic oral rinse  15 mL Mouth Rinse q12n4p  . chlorhexidine  15 mL Mouth Rinse BID  . folic acid  1 mg Oral Daily  . heparin  5,000 Units Subcutaneous 3 times per day  . ipratropium-albuterol  3 mL Nebulization  Q6H  . multivitamin with minerals  1 tablet Oral Daily  . nicotine  21 mg Transdermal Daily  . potassium chloride  10 mEq Intravenous Q1 Hr x 5  . sodium chloride  3 mL Intravenous Q12H  . thiamine  100 mg Oral Daily   Or  . thiamine IV  100 mg Intravenous Daily   Continuous Infusions: . sodium chloride 50 mL/hr at 06/03/13 1038       Time spent: 35 minutes    Verlee Monte  [facility] Pager [phone] If 7PM-7AM, please contact night-coverage at [email or web address], password [facility] 06/04/2013, 7:31 AM  LOS: 3 days

## 2013-06-04 NOTE — Progress Notes (Signed)
Patient report was given to [name], RN at 1400. Patient was transferred with nurse and nurse tech by wheelchair. Her belongings, including medications, clothing and food items, were transferred with her. She was given 1mg  Dilaudid for pain and 4mg  Zofran for nausea prior to transfer. BP 136/82, HR 92, RR 21, O2 100% on room air.

## 2013-06-04 NOTE — Progress Notes (Signed)
I have reviewed and agree with Avel Peace, [place] Clinical Dietitian [phone]

## 2013-06-05 LAB — BASIC METABOLIC PANEL
BUN: 6 mg/dL (ref 6–23)
CO2: 24 meq/L (ref 19–32)
Calcium: 8.7 mg/dL (ref 8.4–10.5)
Chloride: 103 mEq/L (ref 96–112)
Creatinine, Ser: 0.78 mg/dL (ref 0.50–1.10)
GFR calc Af Amer: >90 mL/min (ref >90)
GFR calc non Af Amer: 89 mL/min — ABNORMAL LOW (ref >90)
GLUCOSE: 89 mg/dL (ref 70–99)
POTASSIUM: 3.5 meq/L — AB (ref 3.7–5.3)
Sodium: 137 mEq/L (ref 137–147)

## 2013-06-05 MED ORDER — GUAIFENESIN-DM 100-10 MG/5ML PO SYRP
5.0000 mL | ORAL_SOLUTION | ORAL | Status: DC | PRN
  Filled 2013-06-05: qty 10

## 2013-06-05 MED ORDER — GUAIFENESIN ER 600 MG PO TB12
1200.0000 mg | ORAL_TABLET | Freq: Two times a day (BID) | ORAL | Status: DC
  Administered 2013-06-05 – 2013-06-06 (×3): 1200 mg via ORAL
  Filled 2013-06-05 (×4): qty 2

## 2013-06-05 MED ORDER — IPRATROPIUM-ALBUTEROL 0.5-2.5 (3) MG/3ML IN SOLN
3.0000 mL | Freq: Three times a day (TID) | RESPIRATORY_TRACT | Status: DC
  Administered 2013-06-05: 3 mL via RESPIRATORY_TRACT
  Filled 2013-06-05 (×2): qty 3

## 2013-06-05 MED ORDER — POTASSIUM CHLORIDE CRYS ER 20 MEQ PO TBCR
40.0000 meq | EXTENDED_RELEASE_TABLET | Freq: Once | ORAL | Status: AC
  Administered 2013-06-05: 40 meq via ORAL
  Filled 2013-06-05: qty 2
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
## 2013-06-05 NOTE — Care Management Note (Unsigned)
    Page 1 of 1   06/05/2013     3:13:41 PM CARE MANAGEMENT NOTE 06/05/2013  Patient:  [name], [name]   Account Number:  [id number]  Date Initiated:  06/05/2013  Documentation initiated by:  [facility]  Subjective/Objective Assessment:   63 year old female admitted with alcohol withdrawal.     Action/Plan:   From home with husband.   Anticipated DC Date:  06/08/2013   Anticipated DC Plan:  HOME/SELF CARE  In-house referral  Clinical Social Worker      DC Planning Services  CM consult      Choice offered to / List presented to:             Status of service:  In process, will continue to follow Medicare Important Message given?   (If response is "NO", the following Medicare IM given date fields will be blank) Date Medicare IM given:   Date Additional Medicare IM given:    Discharge Disposition:    Per UR Regulation:  Reviewed for med. necessity/level of care/duration of stay  If discussed at [place] of Stay Meetings, dates discussed:    Comments:

## 2013-06-06 ENCOUNTER — Other Ambulatory Visit: Payer: Self-pay | Admitting: Internal Medicine

## 2013-06-06 LAB — BASIC METABOLIC PANEL
BUN: 7 mg/dL (ref 6–23)
CHLORIDE: 107 meq/L (ref 96–112)
CO2: 20 mEq/L (ref 19–32)
Calcium: 8.6 mg/dL (ref 8.4–10.5)
Creatinine, Ser: 0.91 mg/dL (ref 0.50–1.10)
GFR calc Af Amer: 78 mL/min — ABNORMAL LOW (ref >90)
GFR calc non Af Amer: 67 mL/min — ABNORMAL LOW (ref >90)
Glucose, Bld: 88 mg/dL (ref 70–99)
Potassium: 3.7 mEq/L (ref 3.7–5.3)
SODIUM: 138 meq/L (ref 137–147)

## 2013-06-06 MED ORDER — ADULT MULTIVITAMIN W/MINERALS CH: 1.0000 | ORAL_TABLET | Freq: Every day | ORAL | Status: DC

## 2013-06-06 MED ORDER — HYDROMORPHONE HCL 4 MG PO TABS: 4.0000 mg | ORAL_TABLET | ORAL | Status: DC | PRN

## 2013-06-06 MED ORDER — LISINOPRIL 10 MG PO TABS: 10.0000 mg | ORAL_TABLET | Freq: Every day | ORAL | Status: DC

## 2013-06-06 NOTE — Progress Notes (Signed)
Patient discharge home husband, alert and oriented, discharge instructions given, patient verbalize understanding of discharge instructions given, My Chart access declined at this time, patient in stable condition at this time

## 2013-06-06 NOTE — Discharge Instructions (Signed)

## 2013-06-07 NOTE — Discharge Summary (Signed)
Physician Discharge Summary  [name] DPO:[id number] DOB: July 01, 1950 DOA: 06/01/2013  PCP: Ricke Hey, MD  Admit date: 06/01/2013 Discharge date: 06/07/2013  Time spent: 35 minutes  Recommendations for Outpatient Follow-up:  1. Follow up with PCP in 1 week   Discharge Diagnoses:  Principal Problem:   DTs (delirium tremens) Active Problems:   PANCREATITIS, CHRONIC   Abdominal pain, acute   Chronic alcoholic pancreatitis   Lactic acidosis  Discharge Condition: stable  Diet recommendation: regular   Filed Weights   06/02/13 0145 06/04/13 0400  Weight: 40.2 kg (88 lb 10 oz) 38.2 kg (84 lb 3.5 oz)   History of present illness:  [name] is a 63 y.o. female with h/o chronic EtOH pancreatitis who presents to ED with abdominal pain. Pain is located in her epigastric area, severe, associated with N/V and as a result she vomited up her PO dilaudid. It has been going on for 2 days. It is so severe that she had to stop drinking EtOH yesterday. Unfortunately, patient has an extensive history of severe withdrawal symptoms with EtOH cessation, and she is already in fairly severe withdrawals in the ED.  Hospital Course:  Alcohol withdrawal  - Patient was complaining about nausea and vomiting for several days prior to admission.  - No alcohol consumption for 3-4 days prior to admission, came in with alcohol withdrawal.  - Patient on CIWA protocol, continued here and significantly improved on the day of discharge, not triggering CIWA anymore Acute abdominal pain  - Likely acute on chronic pancreatitis - Patient has transaminitis, acute hepatitis panel is negative.  - CT abdomen angiography showed no evidence of ischemia.  - patient initially NPO, diet advanced slowly and able to tolerate a regular diet prior to discharge.   Elevated lactate  -With abdominal pain lactate went up to 6.8, CT angio was obtained and showed no abnormalities.  -Elevated lactate is likely  secondary to dehydration and resolved with IVF Chronic abdominal pain/narcotic dependence  -Patient is on oral Dilaudid at home, will restart oral medications.  - urged to follow up with her PCP this week (states that she has an appointment in 2 days) Alcohol abuse/nicotine addiction  - Patient counseled extensively about both.  Elevated blood pressure  -Patient denies any history of hypertension, blood pressure went up to 209/129.  -This could be secondary to pain or withdrawal from alcohol, treat with when necessary labetalol.  -And this is consistently high, we'll start her on lisinopril. Script provided, BP better, needs to be further followed up as an outpatient.   Procedures:  None    Consultations:  None   Discharge Exam: Filed Vitals:   06/05/13 1600 06/05/13 2107 06/05/13 2119 06/06/13 0700  BP: 128/70  137/82 144/94  Pulse: 80  89 75  Temp:   98.1 F (36.7 C) 97.9 F (36.6 C)  TempSrc:   Oral Oral  Resp:   18 20  Height:      Weight:      SpO2:  94% 94% 97%    General: NAD Cardiovascular: RRR Respiratory: CTA biL  Discharge Instructions     Medication List         fluticasone 110 MCG/ACT inhaler  Commonly known as:  FLOVENT HFA  Inhale 2 puffs into the lungs 2 (two) times daily.     HYDROmorphone 4 MG tablet  Commonly known as:  DILAUDID  Take 1 tablet (4 mg total) by mouth every 4 (four) hours as needed  for severe pain.     lisinopril 10 MG tablet  Commonly known as:  PRINIVIL,ZESTRIL  Take 1 tablet (10 mg total) by mouth daily.     LORazepam 1 MG tablet  Commonly known as:  ATIVAN  Take 1 tablet (1 mg total) by mouth every 6 (six) hours as needed. For anxiety.     multivitamin with minerals Tabs tablet  Take 1 tablet by mouth daily.     ondansetron 8 MG tablet  Commonly known as:  ZOFRAN  Take 8 mg by mouth every 8 (eight) hours as needed for nausea or vomiting.           Follow-up Information   Follow up with Ricke Hey, MD.  Schedule an appointment as soon as possible for a visit in 1 week.   Specialty:  Family Medicine   Contact information:   [place] [address] [phone]       The results of significant diagnostics from this hospitalization (including imaging, microbiology, ancillary and laboratory) are listed below for reference.    Significant Diagnostic Studies: Dg Chest Portable 1 View  06/01/2013   CLINICAL DATA:  63 year old female shortness of breath. Cough. Initial encounter.  EXAM: PORTABLE CHEST - 1 VIEW  COMPARISON:  03/28/2013 and earlier.  FINDINGS: Portable AP semi upright view at 2124 hrs. Stable large lung volumes. Stable cardiac size and mediastinal contours. Visualized tracheal air column is within normal limits. Stable mild increased interstitial markings with no pneumothorax, pulmonary edema, pleural effusion or acute pulmonary opacity.  IMPRESSION: Stable chronic lung disease. No superimposed acute findings are identified.   Electronically Signed   By: Lars Pinks M.D.   On: 06/01/2013 21:41   Ct Angio Abd/pel W/ And/or W/o  06/02/2013   CLINICAL DATA:  Abdominal pain, elevated lactate  EXAM: CTA ABDOMEN AND PELVIS wITHOUT AND WITH CONTRAST  TECHNIQUE: Multidetector CT imaging of the abdomen and pelvis was performed using the standard protocol during bolus administration of intravenous contrast. Multiplanar reconstructed images and MIPs were obtained and reviewed to evaluate the vascular anatomy.  CONTRAST:  17mL OMNIPAQUE IOHEXOL 350 MG/ML SOLN  COMPARISON:  02/25/2011  FINDINGS: The lung bases are clear.  The liver demonstrates no focal abnormality. There is mild periportal edema. No intrahepatic biliary ductal dilatation. Mild common bile duct dilatation measuring up to 8 mm. The gallbladder is normal. The spleen demonstrates no focal abnormality.There post radiofrequency ablation changes in the inferior pole of the left kidney which are unchanged compared with  02/25/2011. There are no findings to suggest recurrent or residual malignancy. The right kidney, adrenal glands and pancreas are normal. The bladder is unremarkable.  Small hiatal hernia. The stomach, duodenum, small intestine, and large intestine demonstrate no gross abnormality, but evaluation is limited secondary to lack of enteric contrast. There is no pneumoperitoneum, pneumatosis, or portal venous gas. There is a small amount of pelvic free fluid. There is no lymphadenopathy.  The abdominal aorta is normal in caliber with atherosclerosis. The celiac artery, mesenteric artery and inferior mesenteric arteries are patent. The left renal artery is patent. There is atherosclerotic plaque at the origin of the right renal artery which is limited in evaluation secondary to motion artifact.  There are no lytic or sclerotic osseous lesions. Lumbar spine spondylosis most significant at L5-S1. Mild broad-based disc bulges throughout the lumbar spine most prominent at L5-S1. Left proximal femoral intra medullary nail and cannulated femoral neck screw transfixing a healed intertrochanteric fracture.  Review of the MIP images confirms the above findings.  IMPRESSION: 1. The celiac artery common superior mesenteric artery and inferior mesenteric arteries are patent. 2. No findings to suggest ischemic bowel. 3. Post ablation changes in the inferior pole of the left kidney without recurrent or residual neoplasm.   Electronically Signed   By: Kathreen Devoid   On: 06/02/2013 18:36    Microbiology: Recent Results (from the past 240 hour(s))  MRSA PCR SCREENING     Status: None   Collection Time    06/02/13  1:46 AM      Result Value Ref Range Status   MRSA by PCR NEGATIVE  NEGATIVE Final   Comment:            The GeneXpert MRSA Assay (FDA     approved for NASAL specimens     only), is one component of a     comprehensive MRSA colonization     surveillance program. It is not     intended to diagnose MRSA      infection nor to guide or     monitor treatment for     MRSA infections.     Labs: Basic Metabolic Panel:  Recent Labs Lab 06/02/13 0350 06/03/13 0810 06/04/13 0342 06/04/13 1429 06/05/13 0830 06/06/13 0530  NA 139 142 141  --  137 138  K 3.3* 4.0 2.6* 3.5* 3.5* 3.7  CL 102 107 103  --  103 107  CO2 18* 21 26  --  24 20  GLUCOSE 187* 75 88  --  89 88  BUN 8 7 6   --  6 7  CREATININE 0.62 0.55 0.63  --  0.78 0.91  CALCIUM 8.1* 8.9 8.6  --  8.7 8.6   Liver Function Tests:  Recent Labs Lab 06/01/13 2100 06/03/13 0810 06/04/13 0342  AST 45* 77* 38*  ALT 37* 60* 46*  ALKPHOS 123* 108 101  BILITOT 0.3 0.3 0.2*  PROT 8.2 6.2 5.9*  ALBUMIN 3.8 3.1* 2.9*    Recent Labs Lab 06/01/13 2100  LIPASE 31   CBC:  Recent Labs Lab 06/01/13 2209 06/02/13 0350  WBC 4.7 5.9  HGB 12.9 11.2*  HCT 37.3 33.8*  MCV 88.0 91.1  PLT 143* 113*    Signed:  Braden Cimo M Delray Reza  [facility] 06/07/2013, 11:58 AM

## 2013-06-11 IMAGING — CR DG CHEST 2V
2 series · 2 of 2 positions shown · non-contrast
Comparison: 12/21/2009

CLINICAL DATA: Chest pain.  Cough and fever

CHEST - 2 VIEW

[w chest lat]
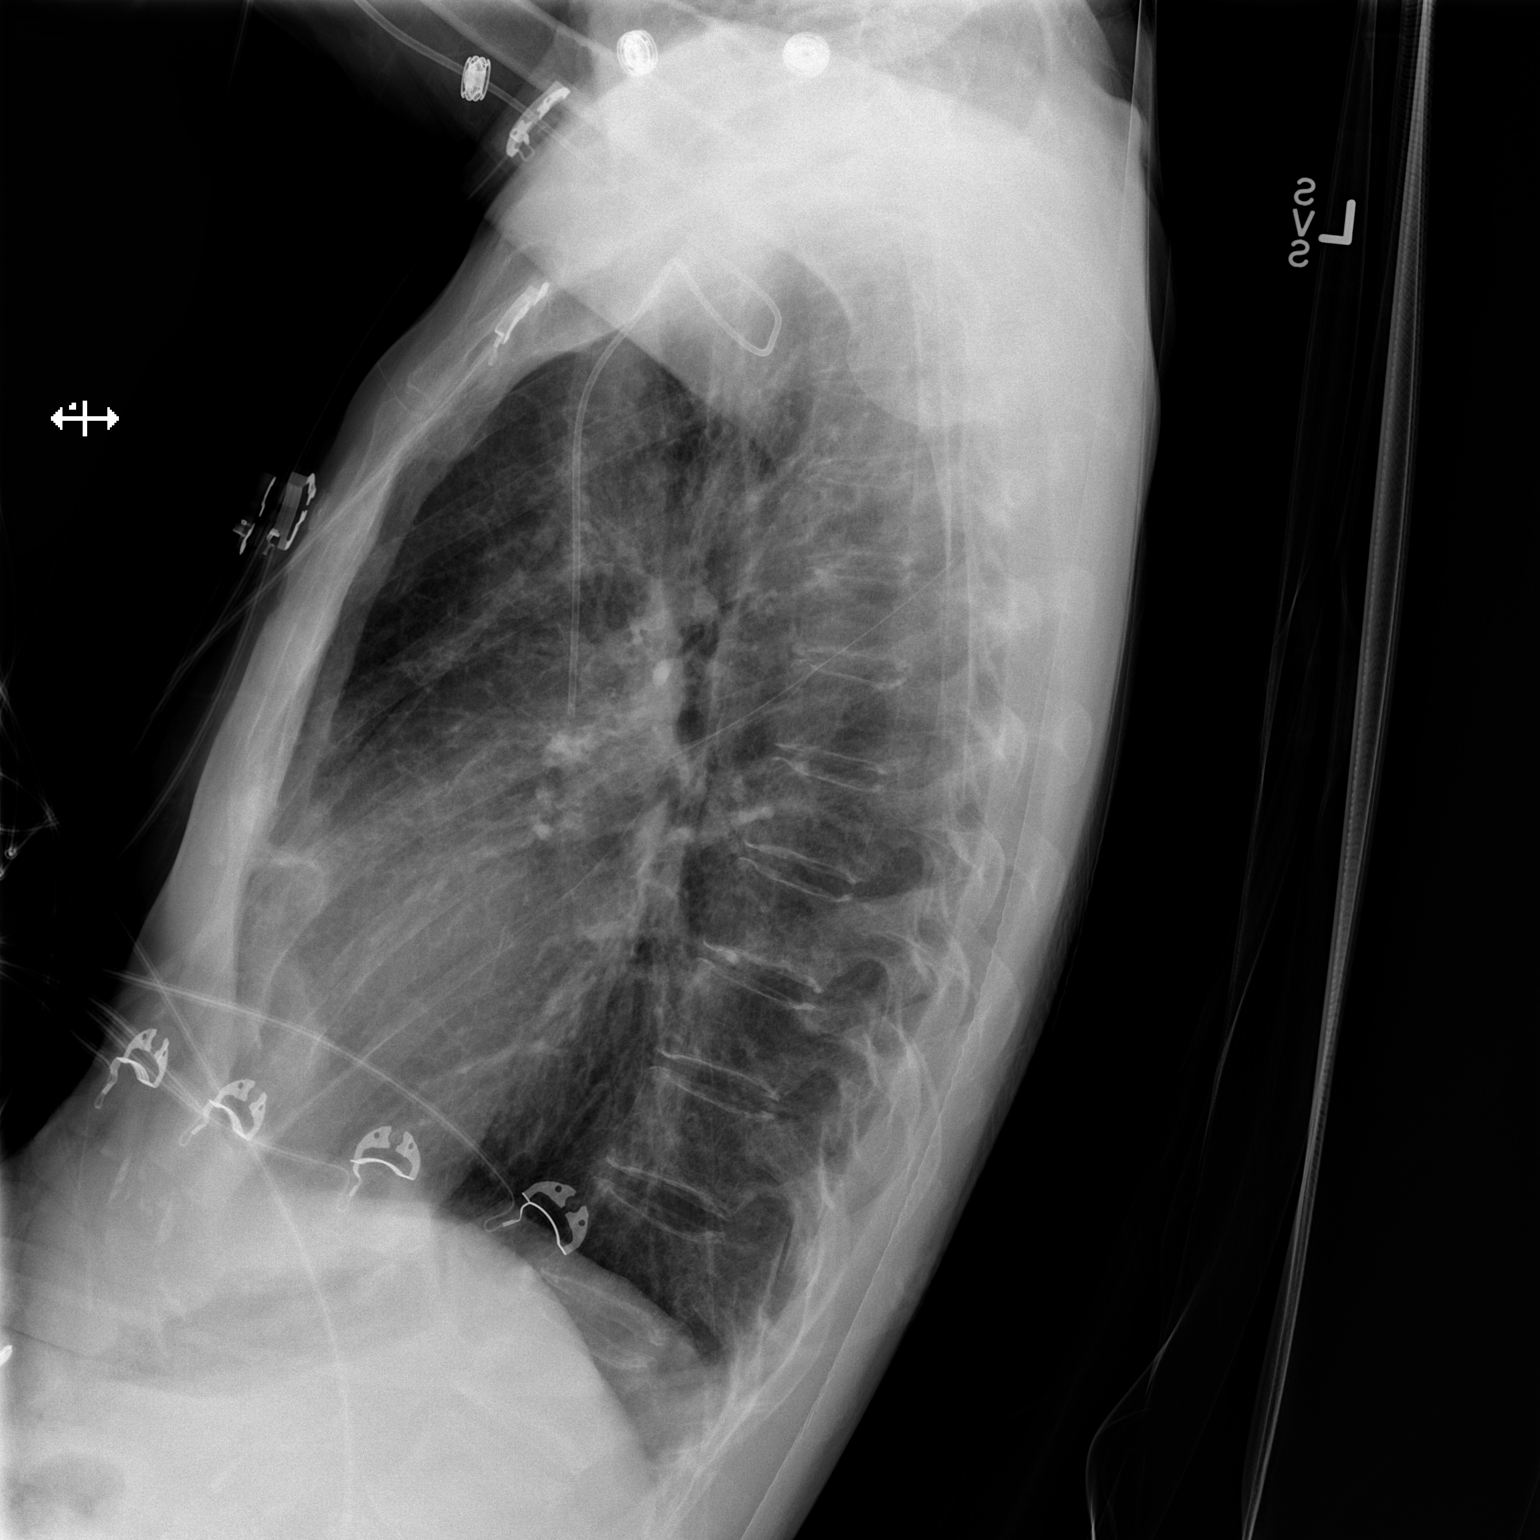

[x chest ap]
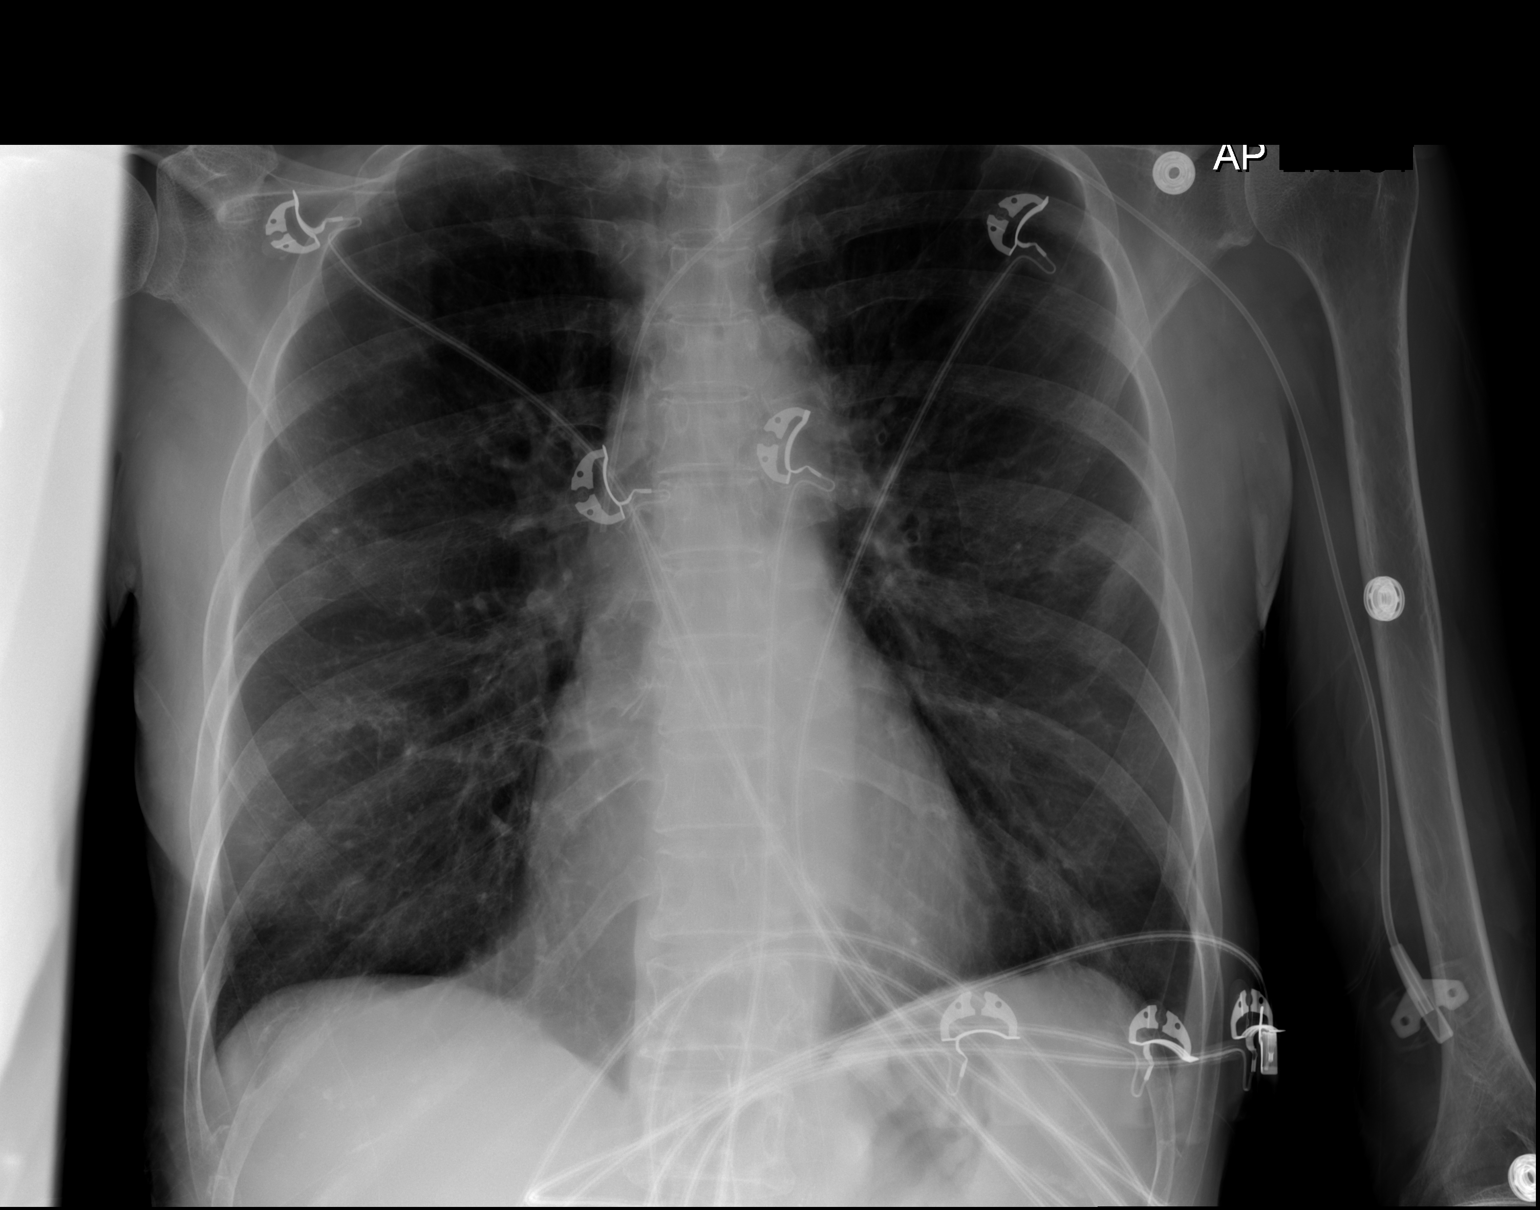

[2 of 2 positions shown; findings below may reference images not displayed]

FINDINGS: Heart size appears normal.

No pleural effusion or pulmonary edema.

Increased lung volumes and coarsened interstitial markings are
noted bilaterally suggestive of COPD.

Patchy bilateral airspace opacities are noted.

There is a left-sided PICC line with tip in the SVC.
IMPRESSION: 1.  Bilateral patchy airspace opacities are superimposed upon
chronic lung disease.  Suspect multifocal infection.

## 2013-06-11 IMAGING — XA IR FLUORO GUIDE CV LINE*L*
1 series · 1 of 1 positions shown · non-contrast
Comparison: none

CLINICAL DATA: The patient requires PICC line for IV contrast
administration for CT scan.  She has a history of poor venous
access and IV infiltration during CT with peripheral IV access.  CT
is for follow-up of prior percutaneous ablation of a left renal
carcinoma.

[Series 300: line placements · 1 of 1 slices shown]
[im 1/1]
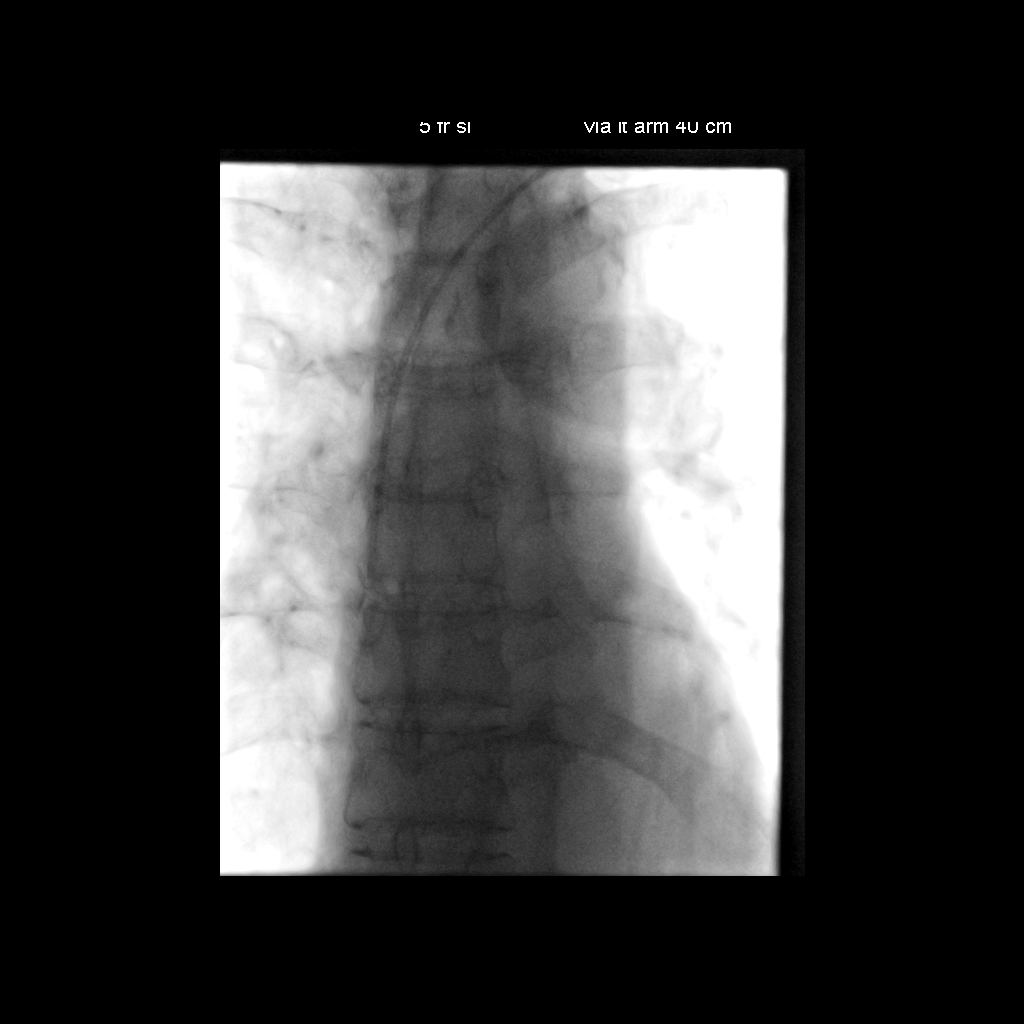

[1 of 1 positions shown; findings below may reference images not displayed]

PICC LINE PLACEMENT WITH ULTRASOUND AND FLUOROSCOPIC  GUIDANCE

Fluoroscopy Time: 0.9 minutes.

The left arm was prepped with chlorhexidine, draped in the usual
sterile fashion using maximum barrier technique (cap and mask,
sterile gown, sterile gloves, large sterile sheet, hand hygiene and
cutaneous antisepsis) and infiltrated locally with 1% Lidocaine.

Ultrasound demonstrated patency of the left brachial vein, and this
was documented with an image.  Under real-time ultrasound guidance,
this vein was accessed with a 21 gauge micropuncture needle and
image documentation was performed.  The needle was exchanged over a
guidewire for a peel-away sheath through which a 5 French single
lumen PICC trimmed to 40 cm was advanced, positioned with its tip
at the lower SVC/right atrial junction.  Fluoroscopy during the
procedure and fluoro spot radiograph confirms appropriate catheter
position.  The catheter was flushed, secured to the skin with
Prolene sutures, and covered with a sterile dressing.

Complications:  None
IMPRESSION: Successful left arm PICC line placement with ultrasound and
fluoroscopic guidance.  The catheter is ready for use.

## 2013-06-11 IMAGING — CT CT ABDOMEN WO/W CM
3 of 9 series · 13 of 46 positions shown, 19 images · IV contrast ([ID] OMNI [id number])
Comparison: 03/10/2010

CLINICAL DATA: Recurrent left renal cell carcinoma.  5 months
status post percutaneous cryoablation.

CT ABDOMEN WITHOUT AND WITH CONTRAST
TECHNIQUE: Multidetector CT imaging of the abdomen was performed
following the standard protocol before and during bolus
administration of intravenous contrast.
Contrast: 100mL OMNIPAQUE IOHEXOL 300 MG/ML IV SOLN

[Series 4: venous · axial · portal-venous · 0.58mm/px · z∈[-363,-201]mm · 6 of 76 slices shown, 11 images]
[im 11/76  soft-tissue]
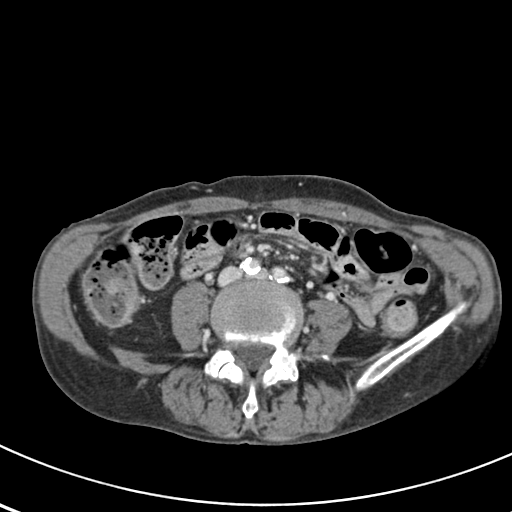
[im 11/76  bone]
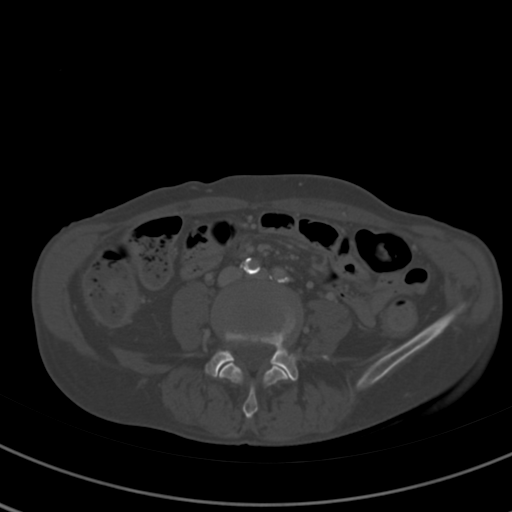
[im 22/76  soft-tissue]
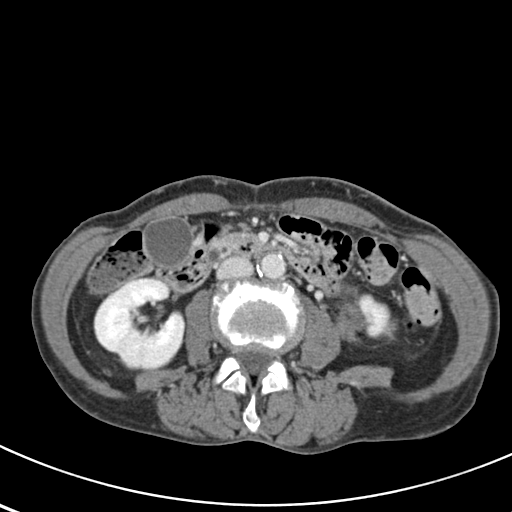
[im 33/76  soft-tissue]
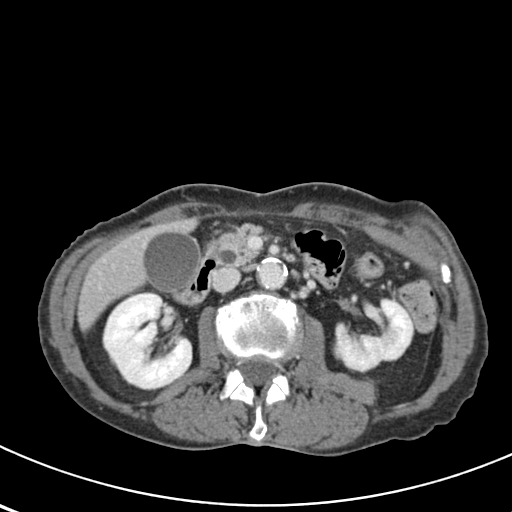
[im 33/76  lung]
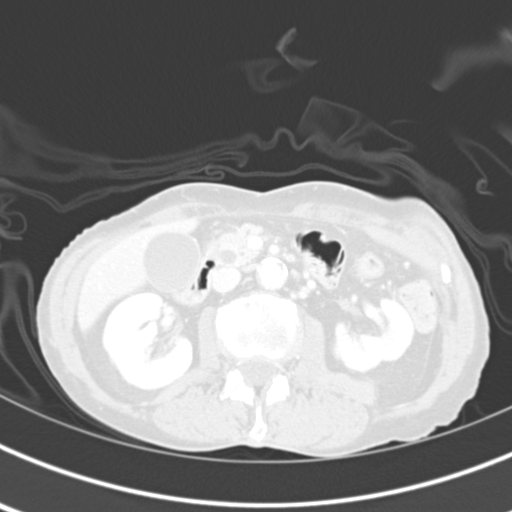
[im 43/76  soft-tissue]
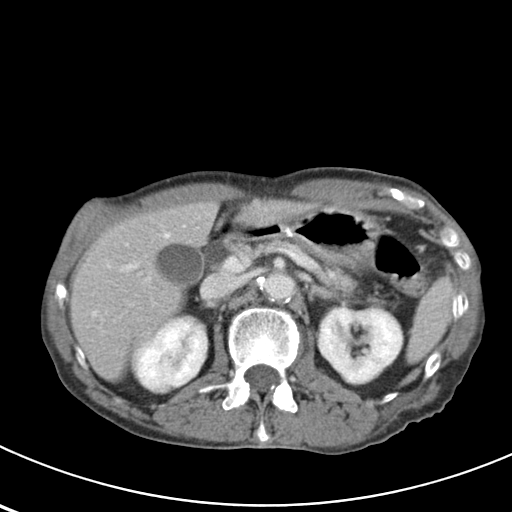
[im 43/76  lung]
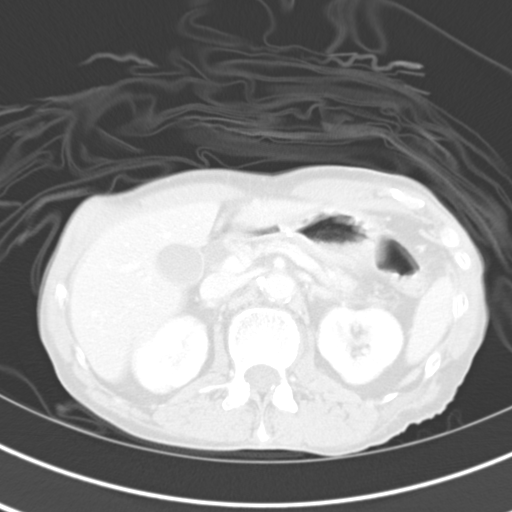
[im 54/76  soft-tissue]
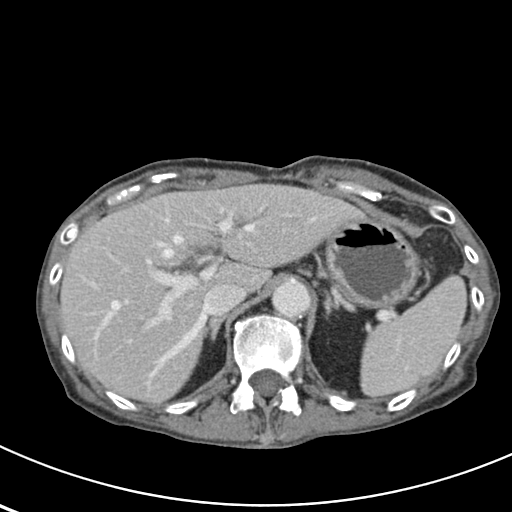
[im 54/76  lung]
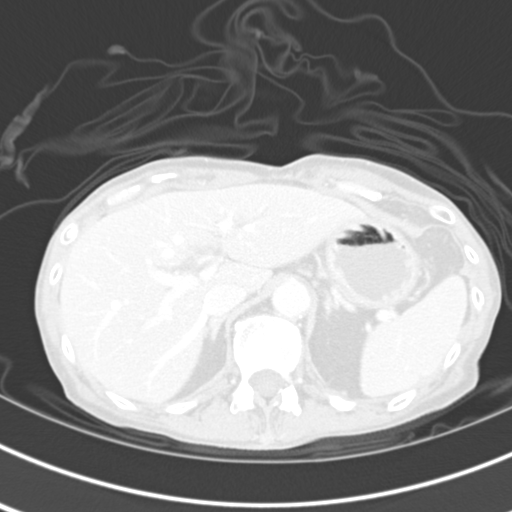
[im 65/76  soft-tissue]
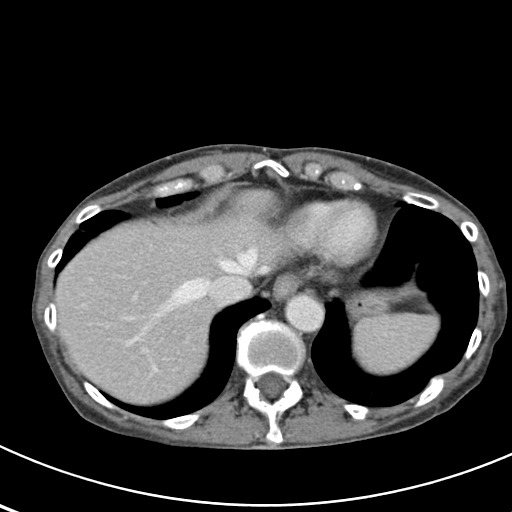
[im 65/76  lung]
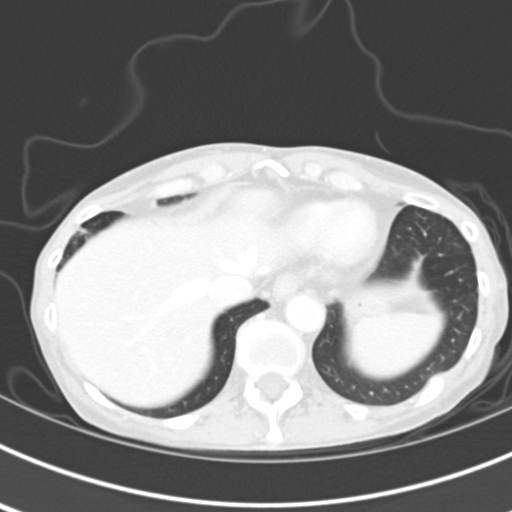

[Series 8: non contrast 3.0 b30f · axial · 0.58mm/px · z∈[-383,-290]mm · 4 of 83 slices shown]
[im 11/83  soft-tissue]
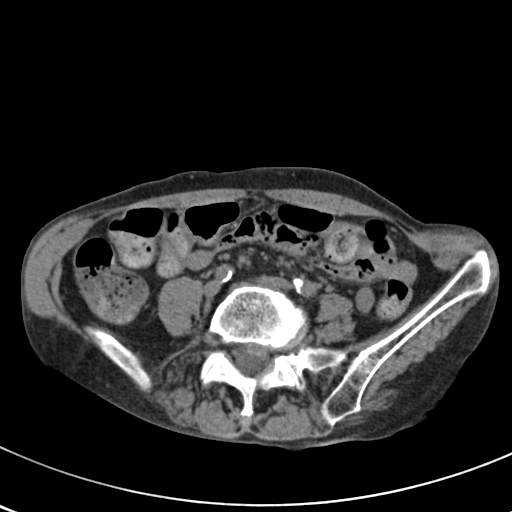
[im 21/83  soft-tissue]
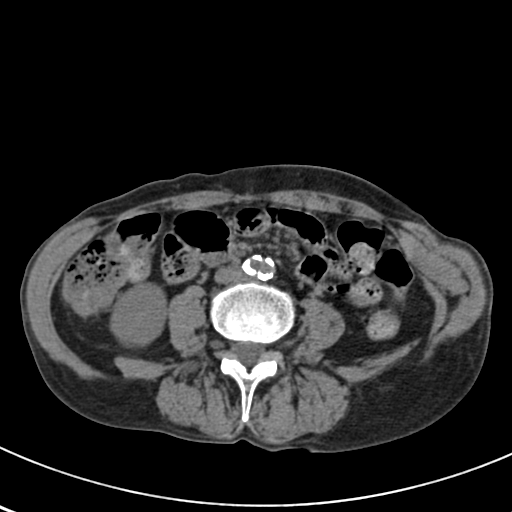
[im 31/83  soft-tissue]
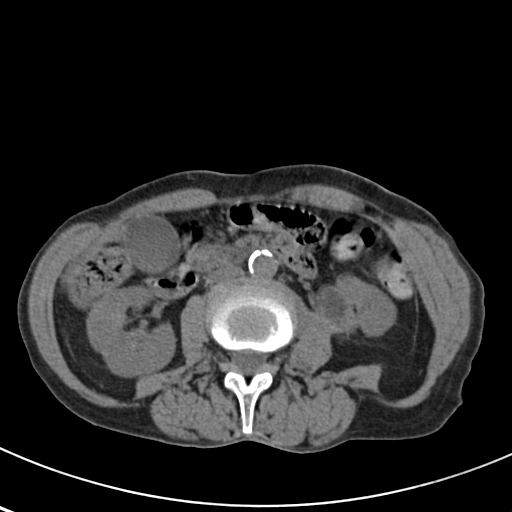
[im 42/83  soft-tissue]
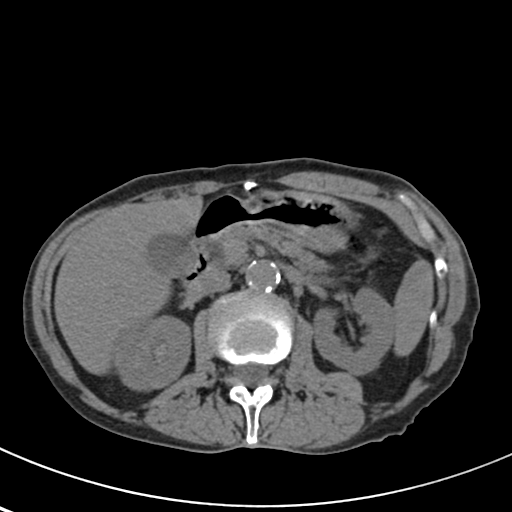

[Series 602: <mpr thick range> · coronal · 0.58mm/px · 3 of 57 slices shown, 4 images]
[im 15/57  soft-tissue]
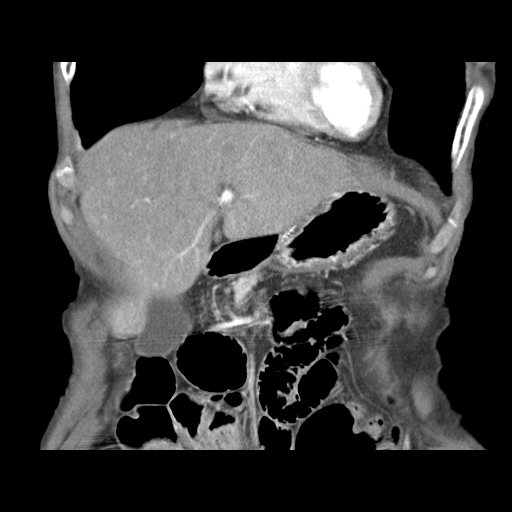
[im 29/57  soft-tissue]
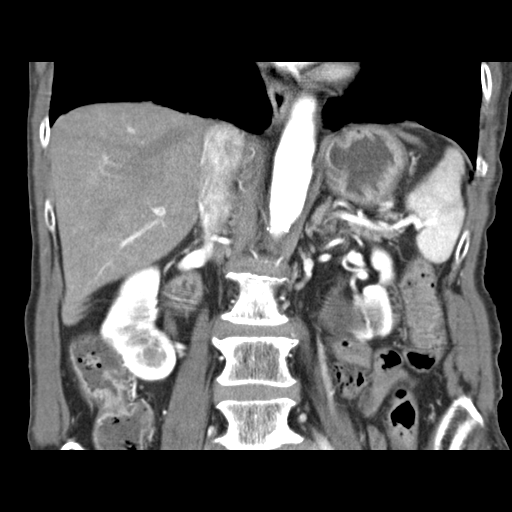
[im 29/57  bone]
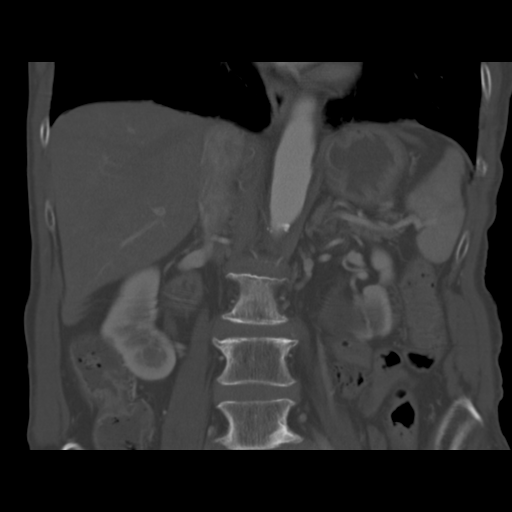
[im 43/57  soft-tissue]
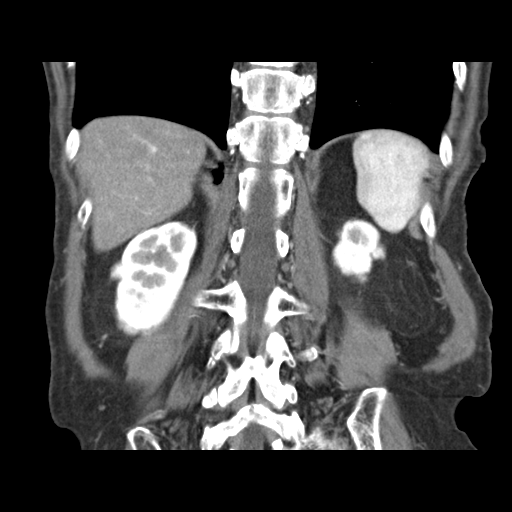

[13 of 46 positions shown; findings below may reference images not displayed]

FINDINGS: Expected appearance of low attenuation cryoablation
defect is seen in the medial lower pole of the left kidney.  There
is no evidence of residual hypervascular neoplasm at the ablation
site.  No other masses are seen involving the left kidney.  The
right kidney remains normal in appearance.  There is no evidence of
hydronephrosis.

Mild biliary ductal dilatation remains stable.  Gallbladder is
unremarkable.  No evidence of pancreatic mass or pancreatic ductal
dilatation.  No liver masses are identified.  The spleen and
adrenal glands are also normal in appearance.

There is no evidence of retroperitoneal or other abdominal
lymphadenopathy.  No evidence of inflammatory process or abnormal
fluid collections within the abdomen.  Visualized portions of the
lung bases are clear.
IMPRESSION: 1.  Expected appearance of cryoablation defect in the medial lower
pole of the left kidney.  No evidence of residual or recurrent
neoplasm.
2.  No evidence of metastatic disease or other acute findings.

## 2013-06-15 IMAGING — CR DG CHEST 2V
2 series · 2 of 2 positions shown · non-contrast
Comparison: 11/27/2010.

CLINICAL DATA: Shortness of breath and weakness.

CHEST - 2 VIEW

[w chest pa]
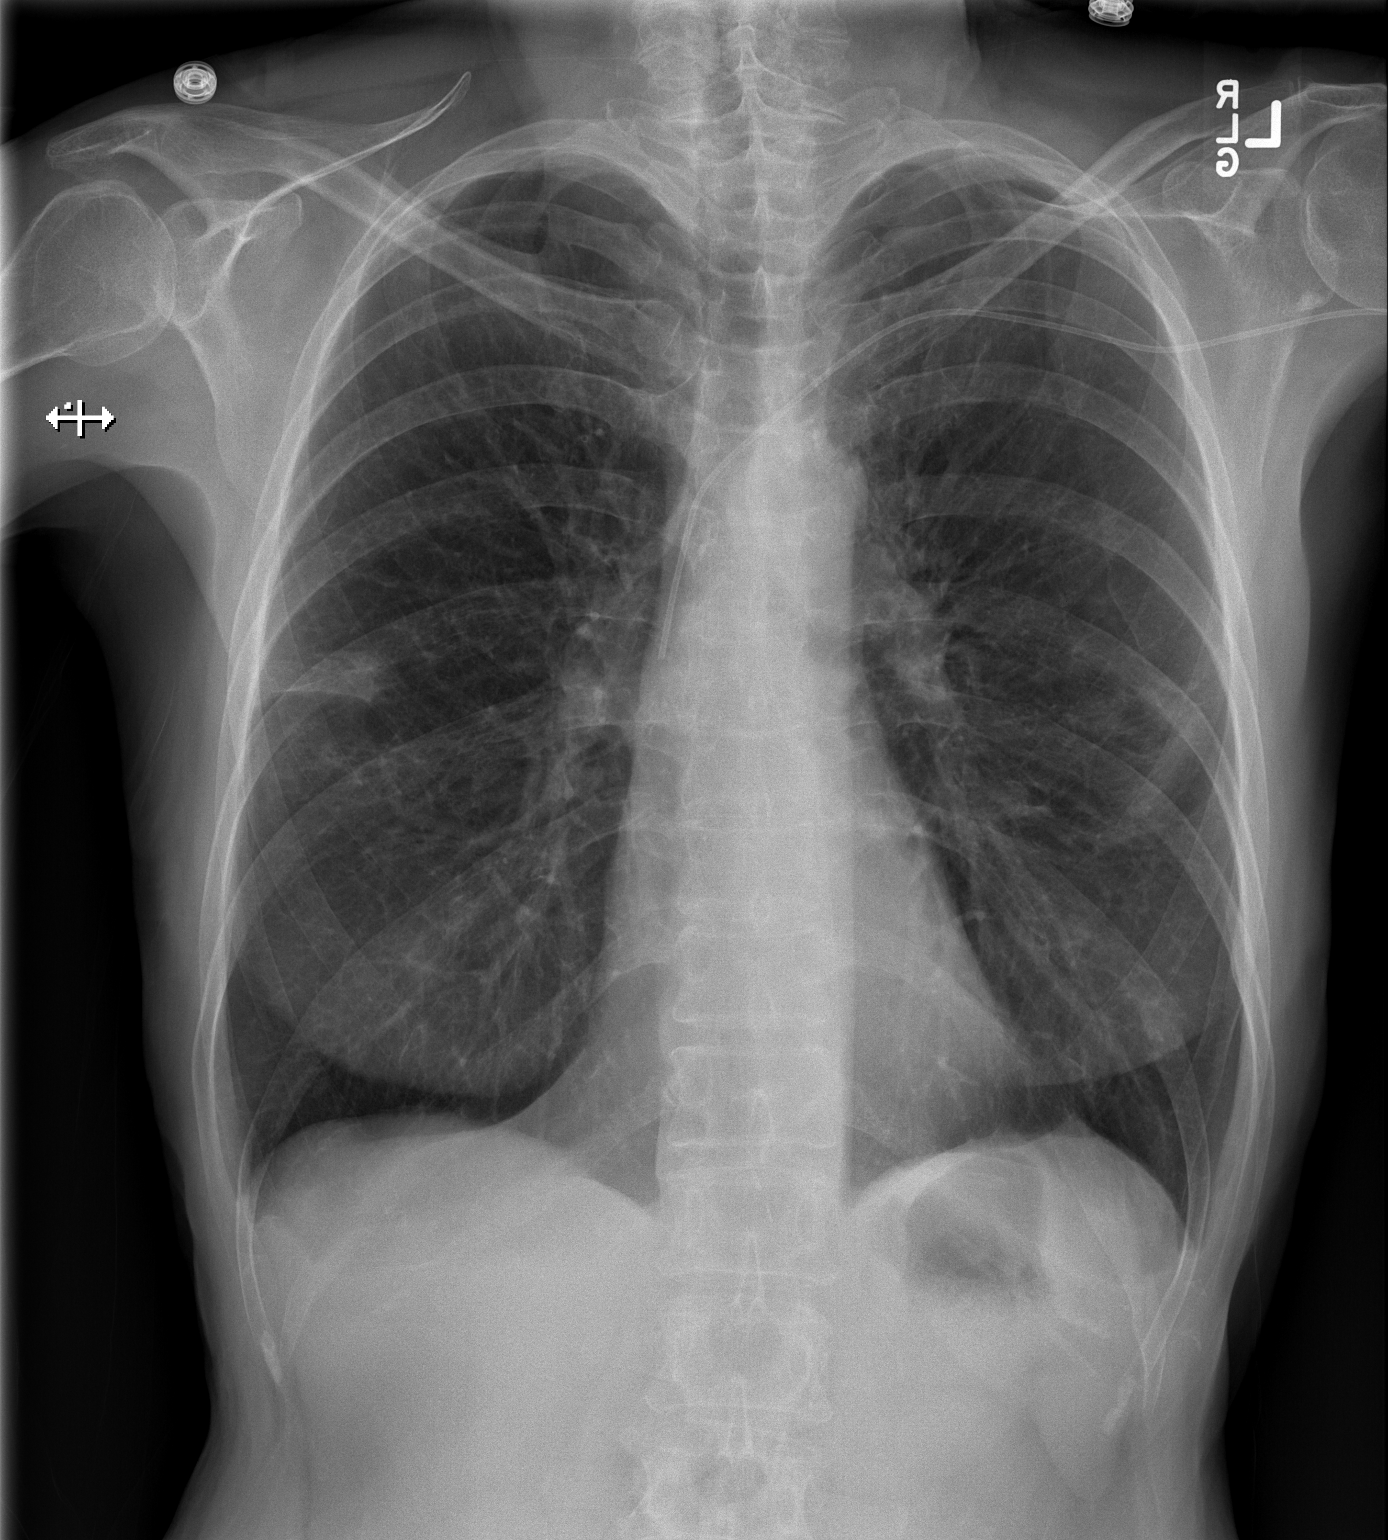

[w chest lat]
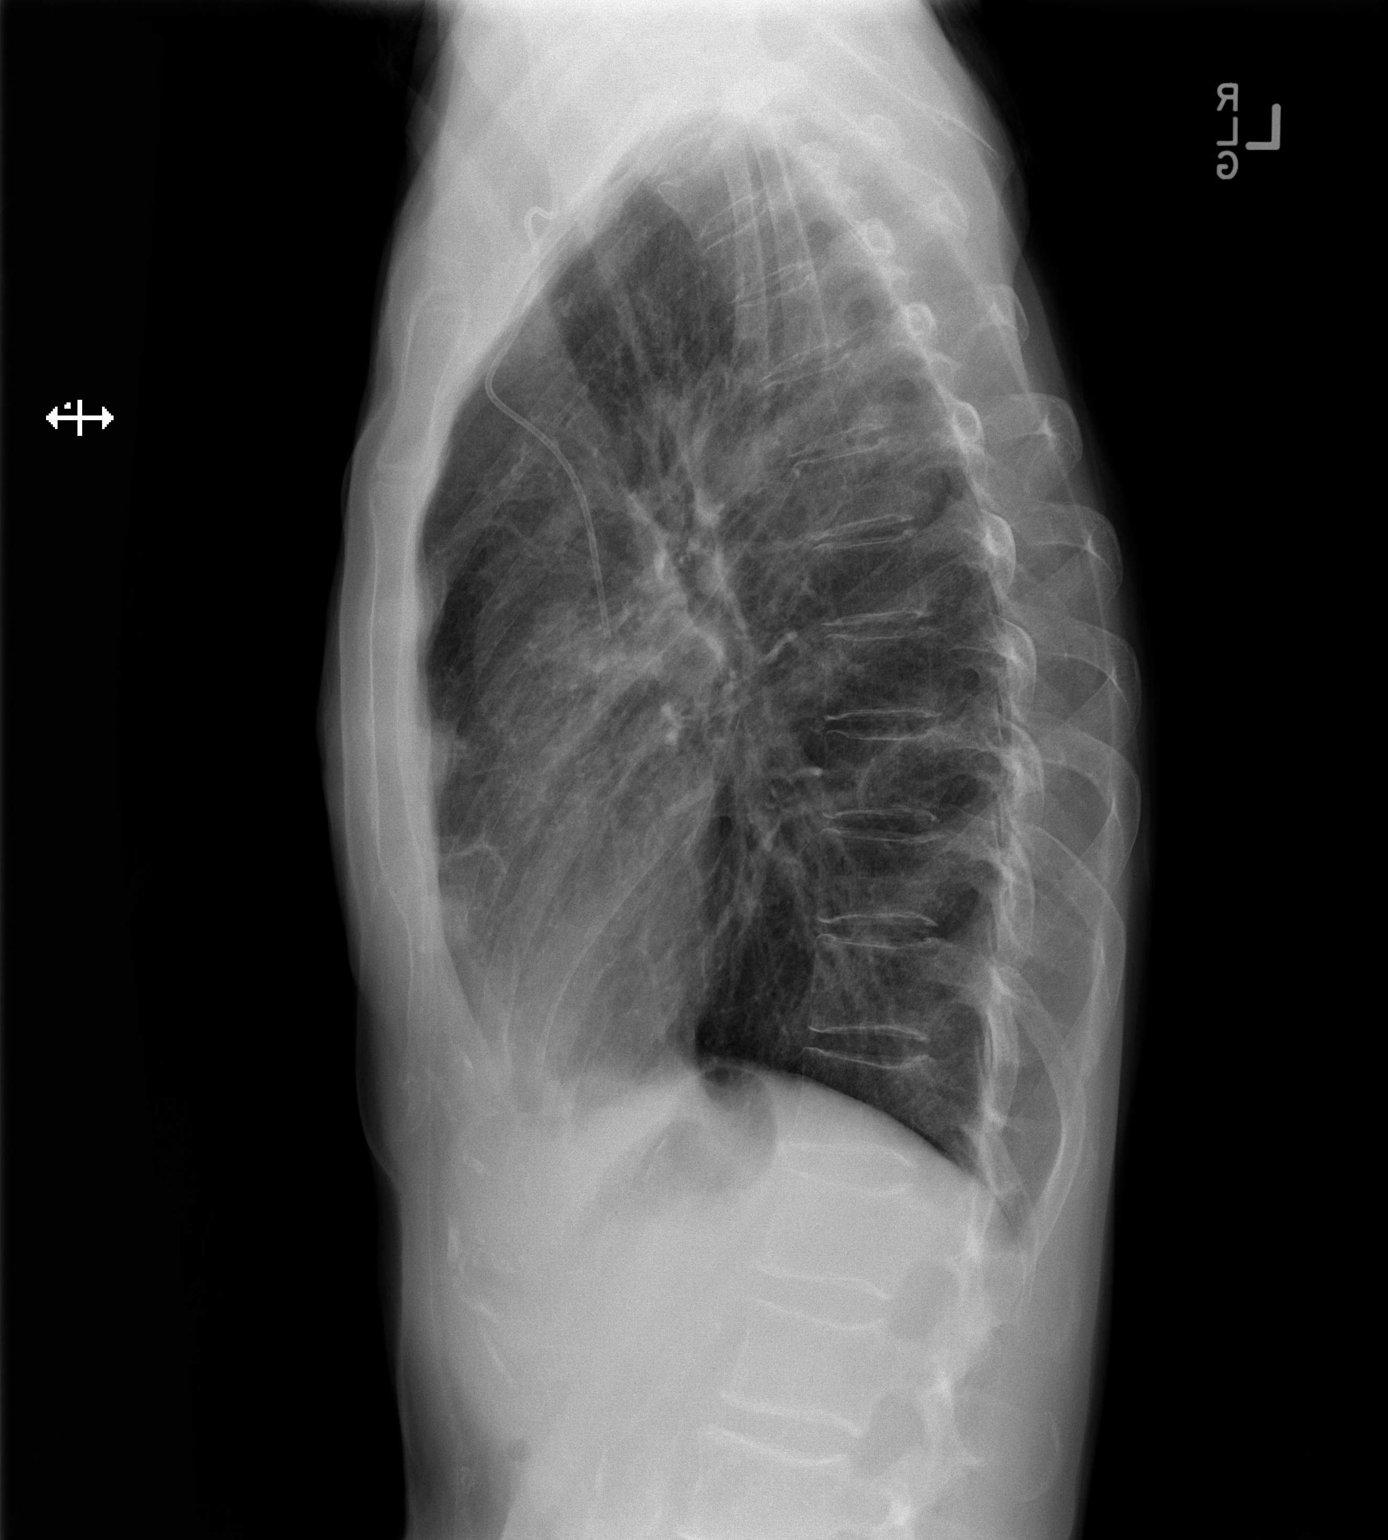

[2 of 2 positions shown; findings below may reference images not displayed]

FINDINGS: Tip of left PICC terminates in superior vena cava.  No
pneumothorax is evident. The cardiac silhouette is normal size and
shape.  Mediastinal and hilar contours appear stable.  There is
slight chronic increase in the reticular interstitial markings and
there is a mild hyperinflation configuration.  There is a new
patchy infiltrate in the inferior lateral aspect of the right upper
lobe.  No pleural effusion is evident.  There is slight tenting of
the left hemidiaphragmatic pleura. Bones appear average for age.
IMPRESSION: PICC in place.  No pneumothorax.
 There is slight chronic increase in the reticular interstitial
markings and there is a mild hyperinflation configuration.  There
is a new patchy infiltrate in the inferior lateral aspect of the
right upper lobe. This may reflect atelectasis or small area of
pneumonia.

## 2013-07-03 ENCOUNTER — Other Ambulatory Visit: Payer: Self-pay | Admitting: Internal Medicine

## 2013-09-09 IMAGING — CT CT ABD-PELV W/O CM
2 of 4 series · 16 of 46 positions shown, 18 images · non-contrast
Comparison: CT of the abdomen 11/27/2010.  CT of abdomen and pelvis
06/15/2010.

CLINICAL DATA: Mid abdominal pain.  History of chronic
pancreatitis.

CT ABDOMEN AND PELVIS WITHOUT CONTRAST
TECHNIQUE: Multidetector CT imaging of the abdomen and pelvis was
performed following the standard protocol without intravenous
contrast.

[Series 2: abd/pel w/o · axial · non-contrast · 0.64mm/px · z∈[-494,-129]mm · 13 of 81 slices shown, 15 images]
[im 4/81  soft-tissue]
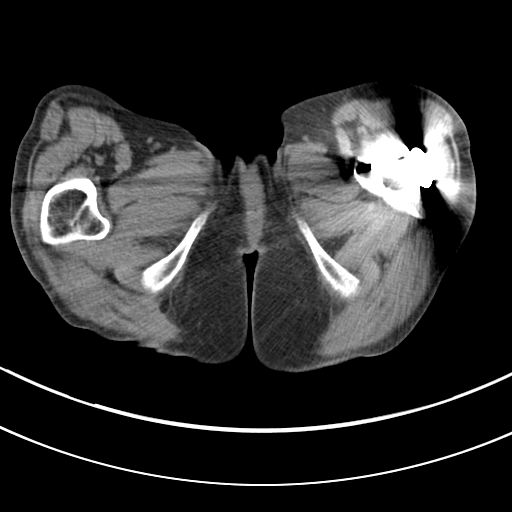
[im 4/81  bone]
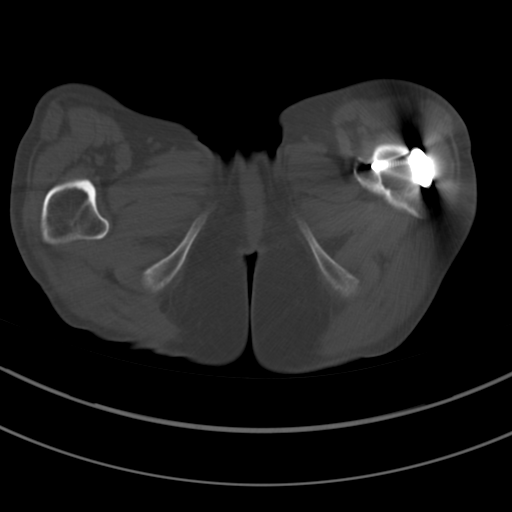
[im 10/81  soft-tissue]
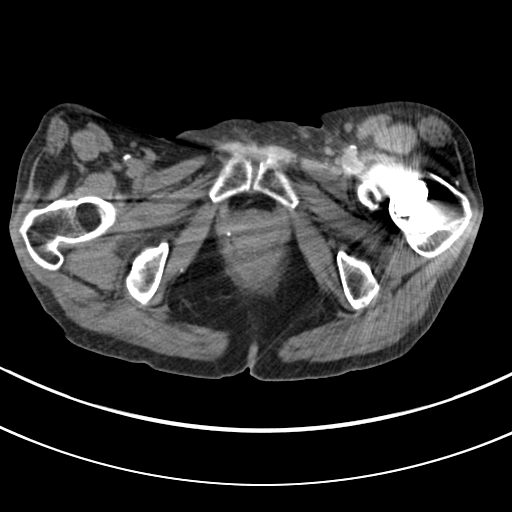
[im 17/81  soft-tissue]
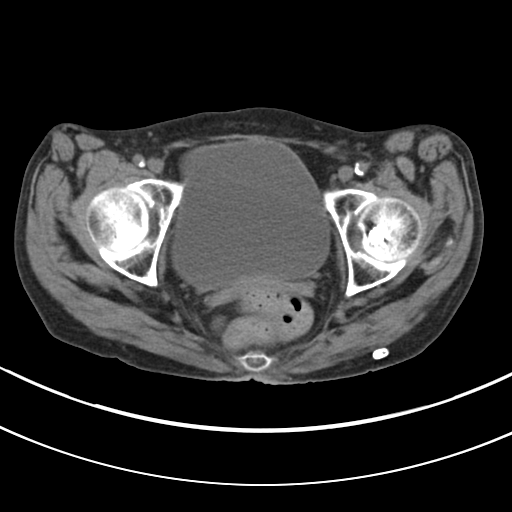
[im 23/81  soft-tissue]
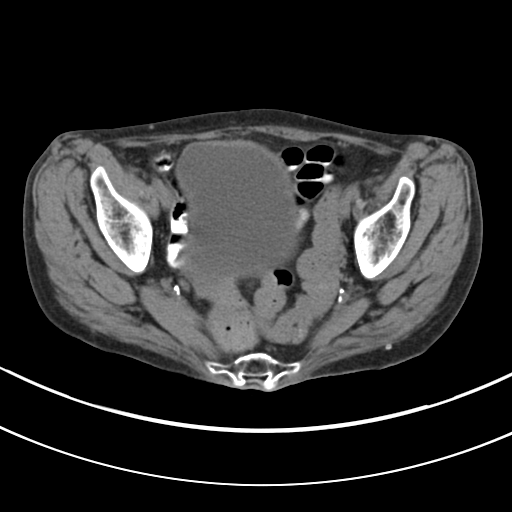
[im 29/81  soft-tissue]
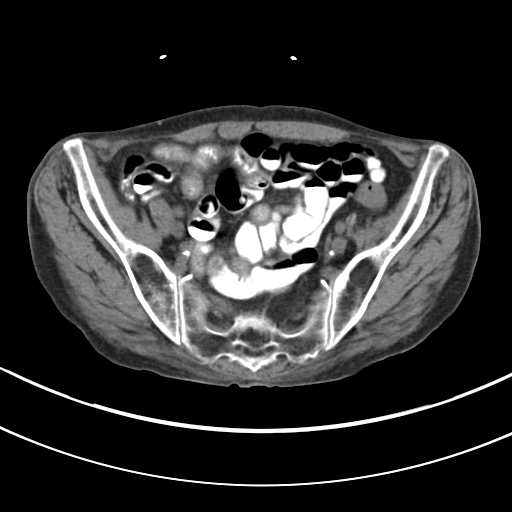
[im 36/81  soft-tissue]
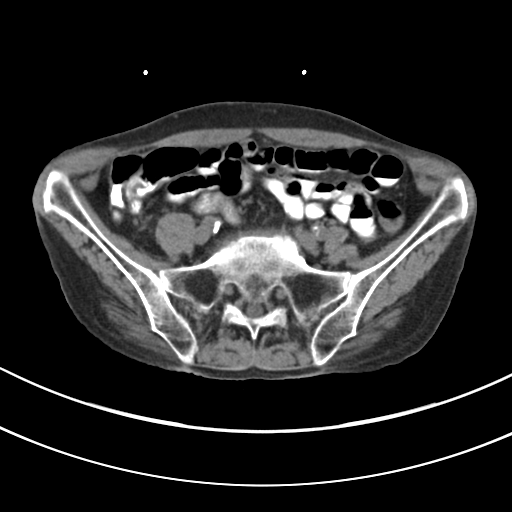
[im 42/81  soft-tissue]
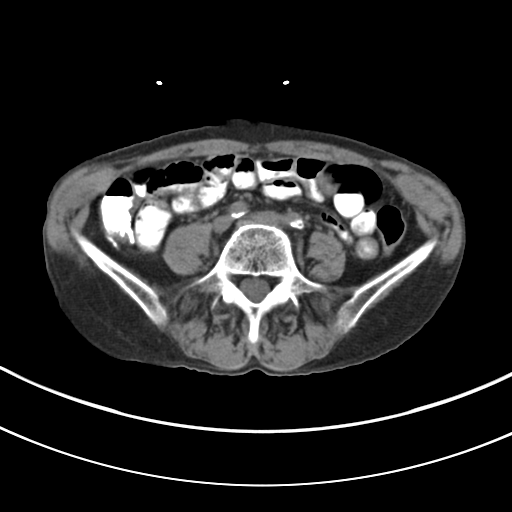
[im 45/81  soft-tissue]
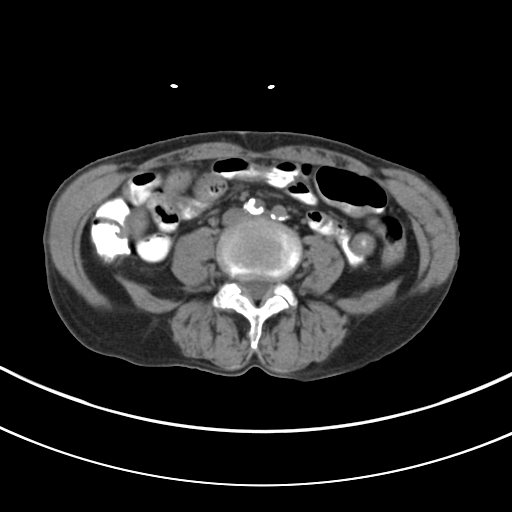
[im 52/81  soft-tissue]
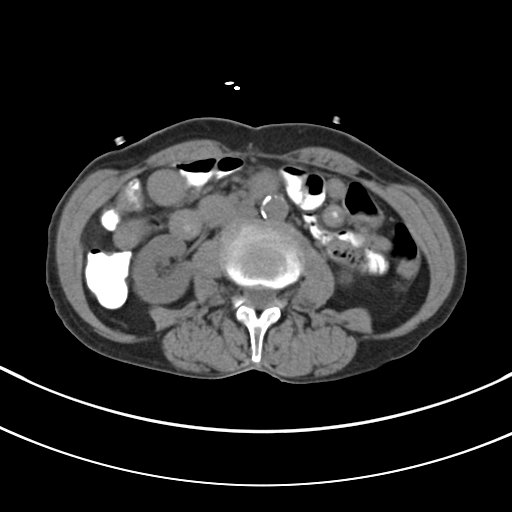
[im 52/81  bone]
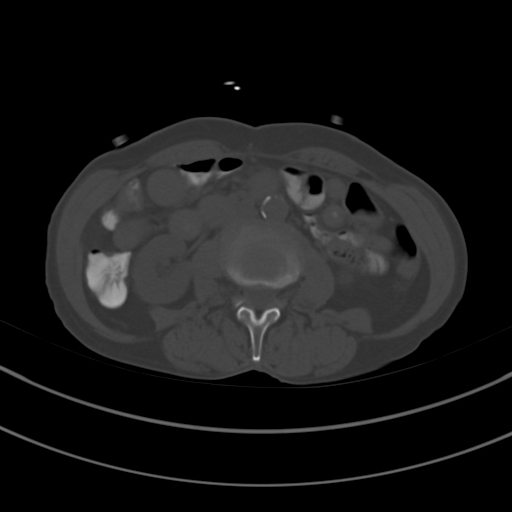
[im 58/81  soft-tissue]
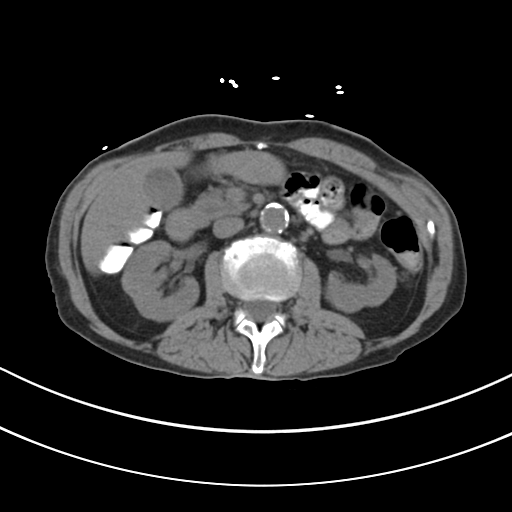
[im 65/81  soft-tissue]
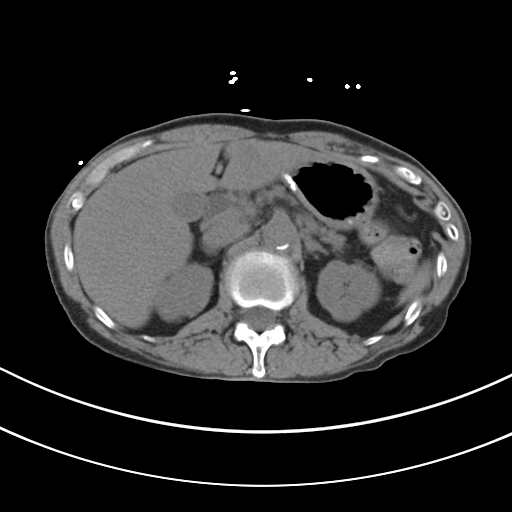
[im 71/81  soft-tissue]
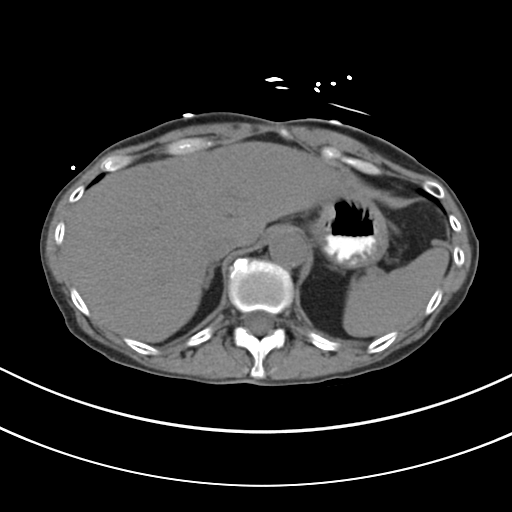
[im 77/81  soft-tissue]
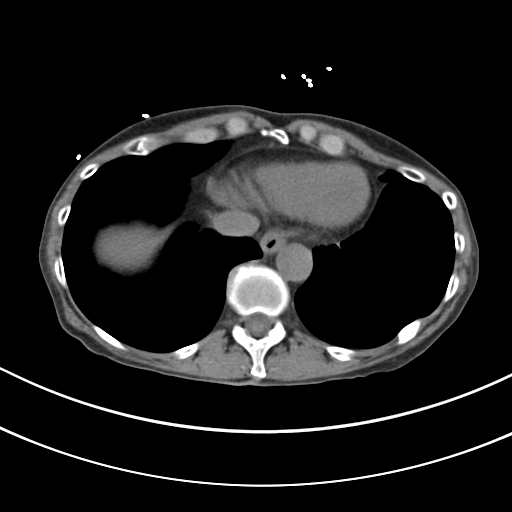

[Series 602: <mpr thick range> · coronal · 0.78mm/px · 3 of 56 slices shown]
[im 19/56  soft-tissue]
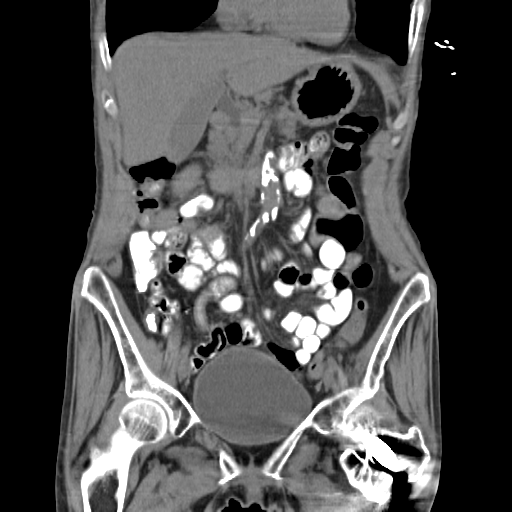
[im 25/56  soft-tissue]
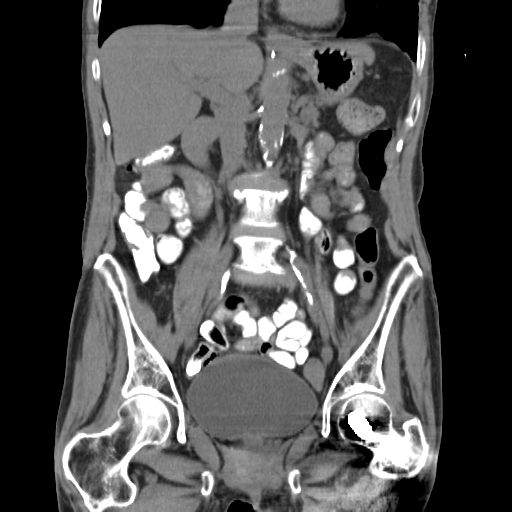
[im 31/56  soft-tissue]
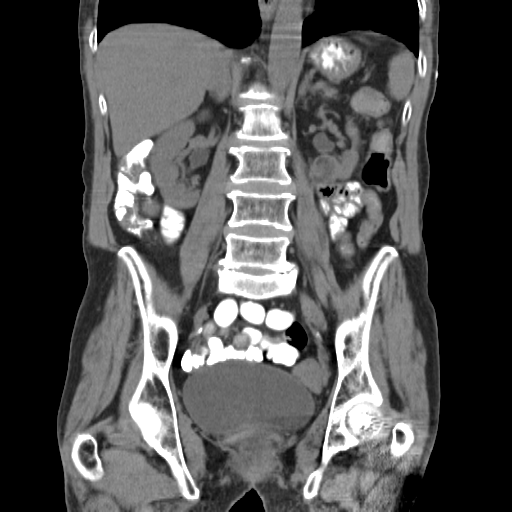

[16 of 46 positions shown; findings below may reference images not displayed]

FINDINGS: Lung Bases: Unremarkable.

Abdomen/Pelvis:  In the medial aspect of the lower pole of the
right kidney there is a well-circumscribed 2.2 x 1.7 cm area of low
attenuation (approximately 8 HU) with some thin peripheral
calcification, that appears to represent a post radiofrequency
ablation site.  No abnormal surrounding soft tissue to suggest
local recurrence of disease.  No significant adjacent para-aortic
lymphadenopathy.  The contralateral kidney is unremarkable in
appearance.  No abnormal urinary tract calcifications or signs of
hydroureteronephrosis.

The unenhanced appearance of the liver, gallbladder, pancreas,
spleen and bilateral adrenal glands is unremarkable.  Extensive
atherosclerosis of the abdominal and pelvic vasculature, without
evidence of abdominal aortic aneurysm.  No ascites or
pneumoperitoneum and no pathologic distension of bowel.  No
definite pathologic adenopathy.  The patient is status post
hysterectomy.  Ovaries are not confidently visualized and may be
atrophic or may have been surgically removed as well.  Urinary
bladder is unremarkable in appearance.

Musculoskeletal: Status post ORIF and left proximal femur.  Bones
are mildly osteopenic. There are no aggressive appearing lytic or
blastic lesions noted in the visualized portions of the skeleton.
A healing fracture of the posterior aspect of the left tenth rib is
incidentally noted.
IMPRESSION: 1.  Expected postprocedural appearance of the lower pole left
kidney related to prior radiofrequency ablation, with continued
decrease in size of post RFA region.  No definitive signs of local
recurrence of disease.
2.  Atherosclerosis.
3.  Status post hysterectomy and ORIF of left proximal femur.

## 2013-09-10 IMAGING — US US ABDOMEN COMPLETE
1 series · 13 of 25 positions shown · non-contrast
Comparison: CT 11/27/2010, 02/25/2011 and 03/10/2010

CLINICAL DATA: Thrombocytopenia.  Question gallstones.  Cirrhosis.
Recurrent left renal cell carcinoma with recent percutaneous
ablation.

COMPLETE ABDOMINAL ULTRASOUND

[Series 1: us abdomen complete · 0.24mm/px · 13 of 83 slices shown]
[im 1/83]
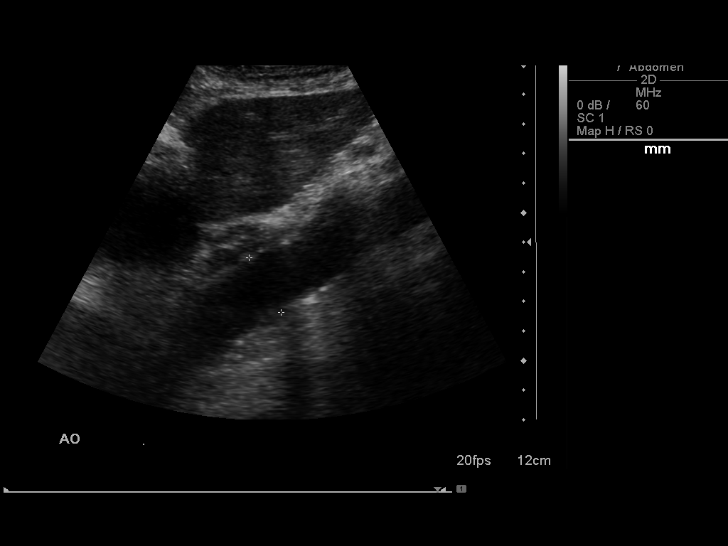
[im 7/83]
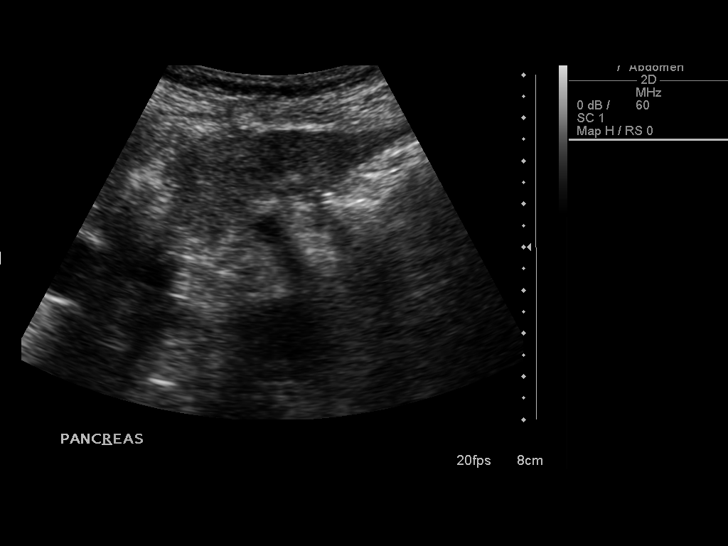
[im 14/83]
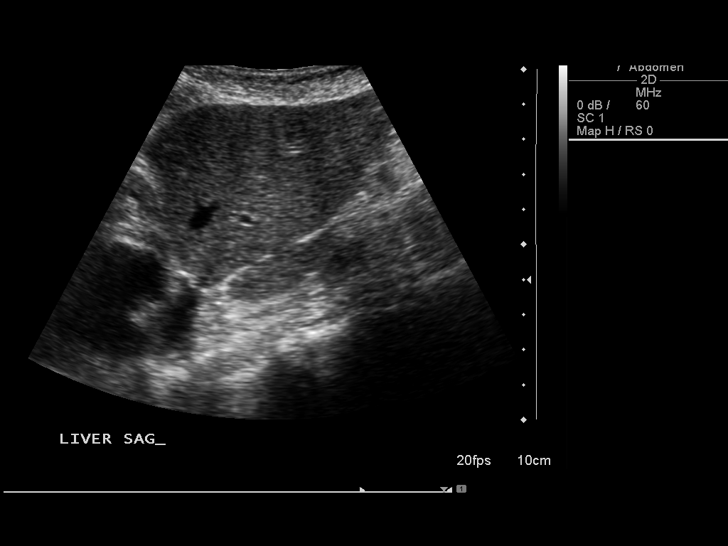
[im 21/83]
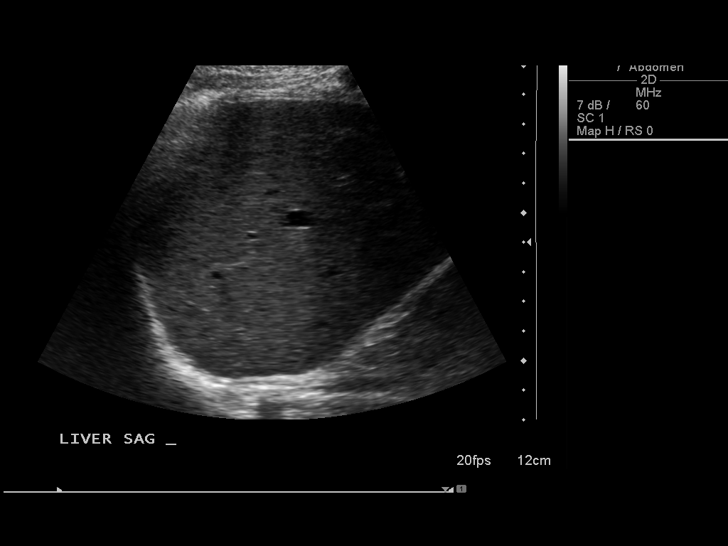
[im 28/83]
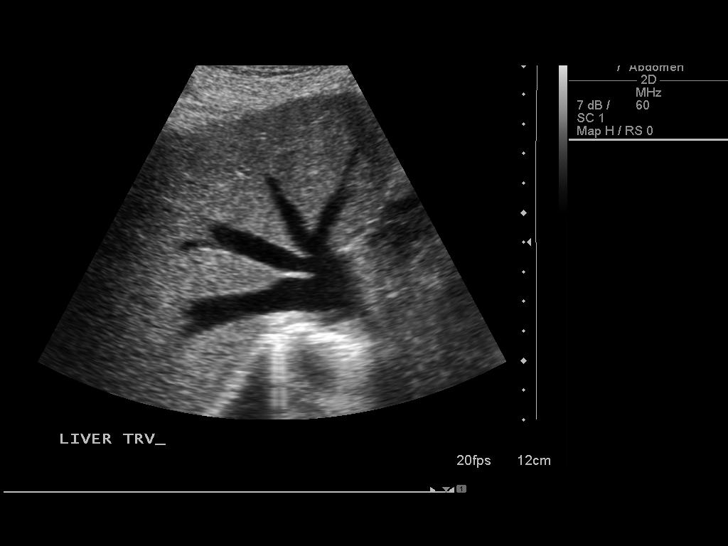
[im 35/83]
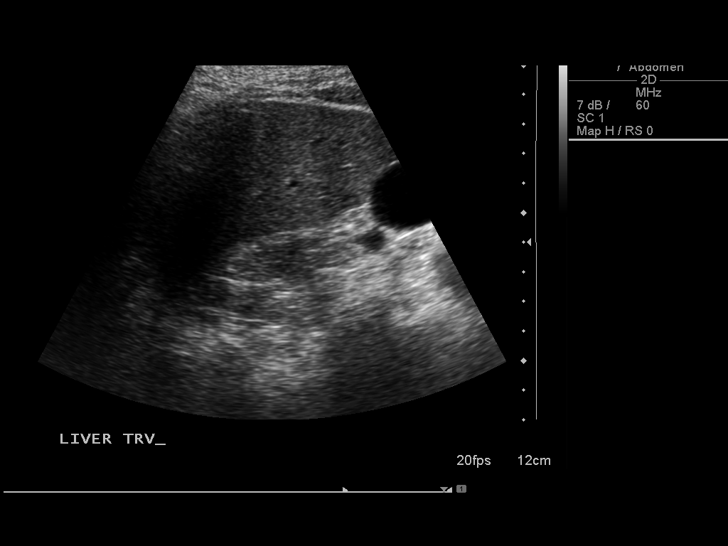
[im 42/83]
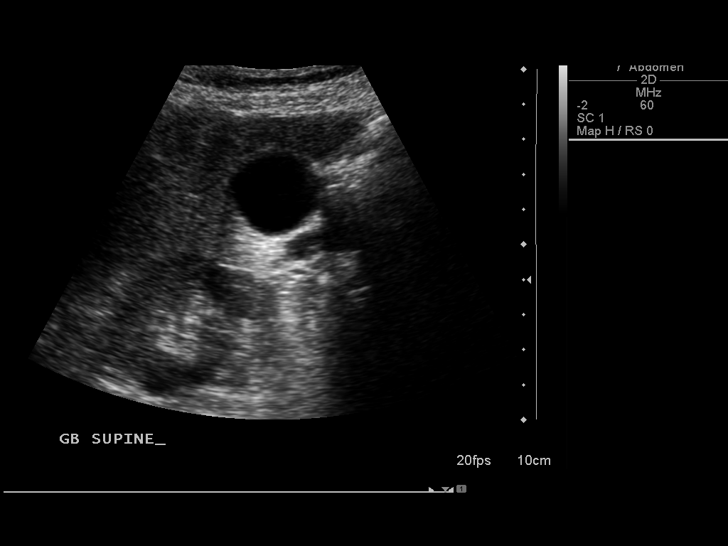
[im 48/83]
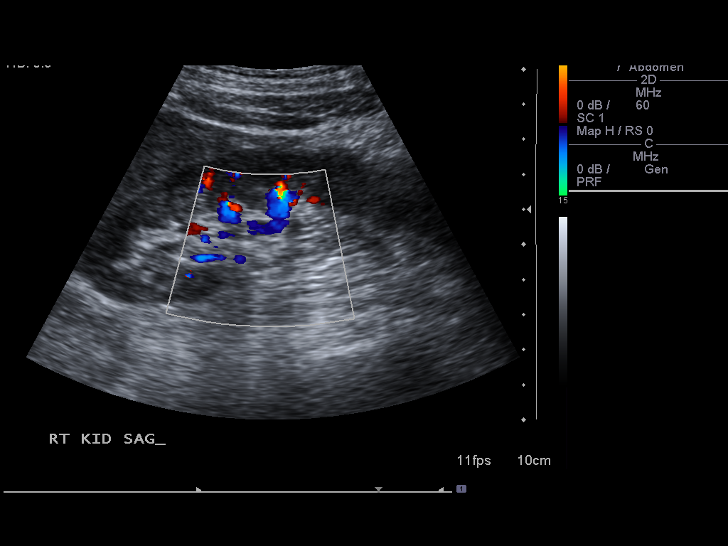
[im 55/83]
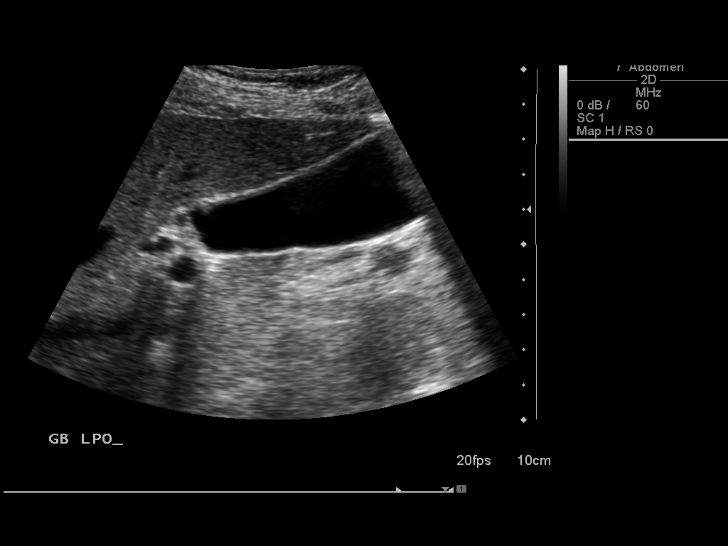
[im 62/83]
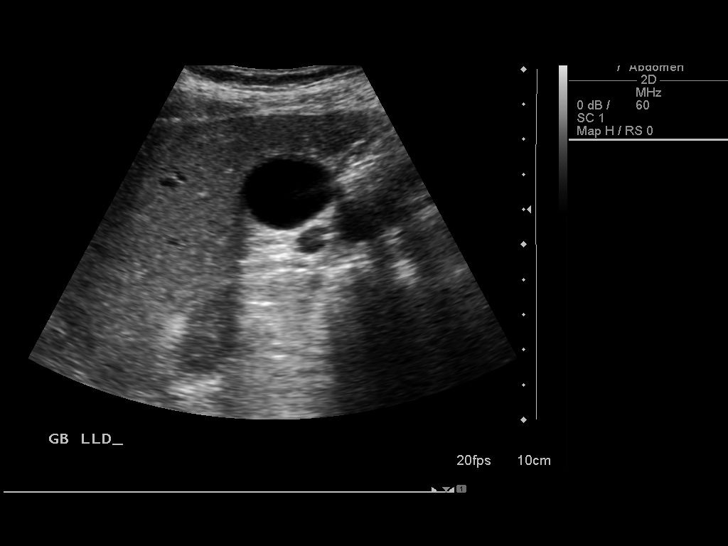
[im 69/83]
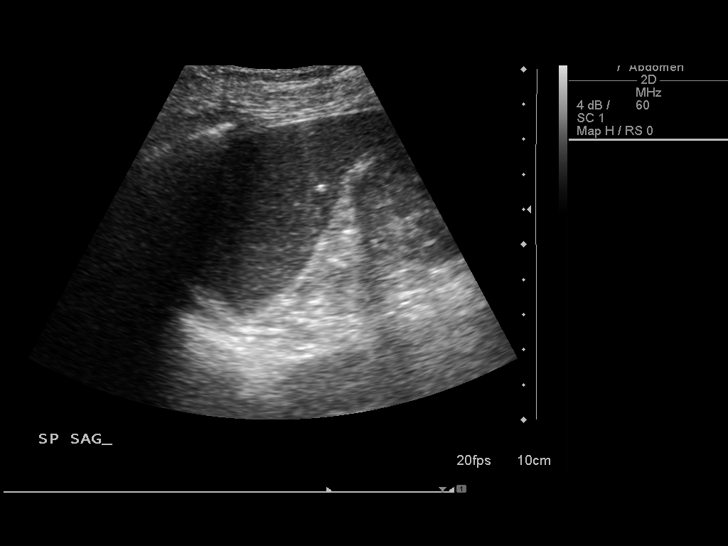
[im 76/83]
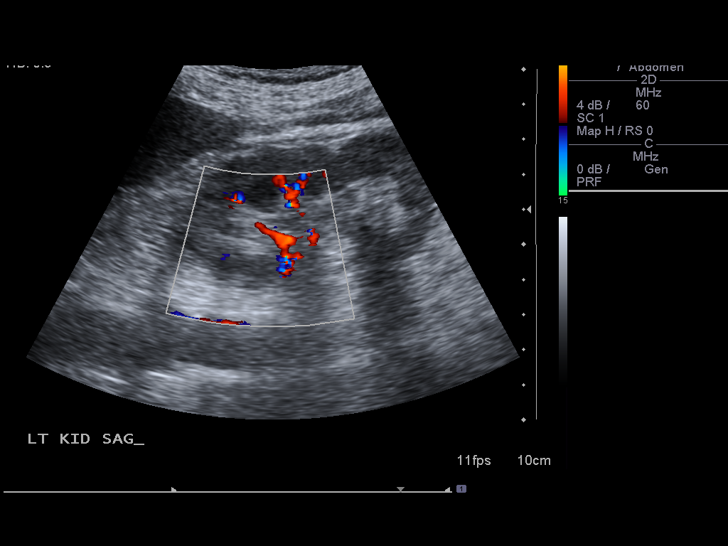
[im 83/83]
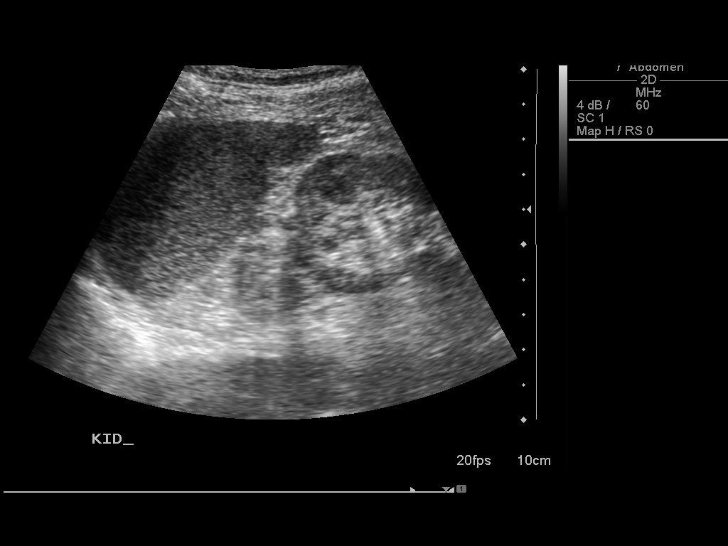

[13 of 25 positions shown; findings below may reference images not displayed]

FINDINGS: Gallbladder:  No gallstones, gallbladder wall thickening, or
pericholecystic fluid. Evaluation for a sonographic Murphy's sign
is negative

Common bile duct:  Measures 11.4 mm in diameter.  This is enlarged
but unchanged in comparison to prior CTs dating back to 03/10/2010

Liver:  No focal lesion identified.  Within normal limits in
parenchymal echogenicity. No signs of intrahepatic ductal
dilatation are identified.  The hepatic capsule appears smooth and
no enlargement of the caudate lobe is seen.

IVC:  The proximal portion appears normal

Pancreas:  Is poorly visualized due to shadowing from overlying gas

Spleen:  Has a sagittal length of 6.8 cm.  A small calcification
seen on recent CT in the region of the splenic hilum is identified.
No other focal parenchymal abnormalities are suggested.

Right Kidney:  Has a sagittal length of 10.4 cm.  No focal
parenchymal abnormalities or signs of hydronephrosis are seen

Left Kidney:  Demonstrates a sagittal length of 8.9 cm.  No signs
of hydronephrosis are evident.  The post ablation area in the lower
pole was better demonstrated on recent CT than on today's exam.

Abdominal aorta:  Demonstrates a maximal caliber of 2.1 cm with no
aneurysmal dilatation evident.  Atheromatous change is seen with
calcification diffusely.
IMPRESSION: No cholelithiasis or other focal gallbladder abnormality.

Stable common bile duct enlargement since [DATE] with no
associated intrahepatic ductal dilatation identified.  Poorly
assessed pancreas today.

Left renal post ablation changes were better assessed with recent
CTs.

## 2013-09-10 IMAGING — XA IR FLUORO GUIDE CV LINE*L*
2 series · 2 of 2 positions shown · non-contrast
Comparison: none

CLINICAL DATA: History of chronic pancreatitis with nausea and
vomiting.  The patient needs IV access.

[Series 1: care single · 1 of 1 slices shown]
[im 1/1]
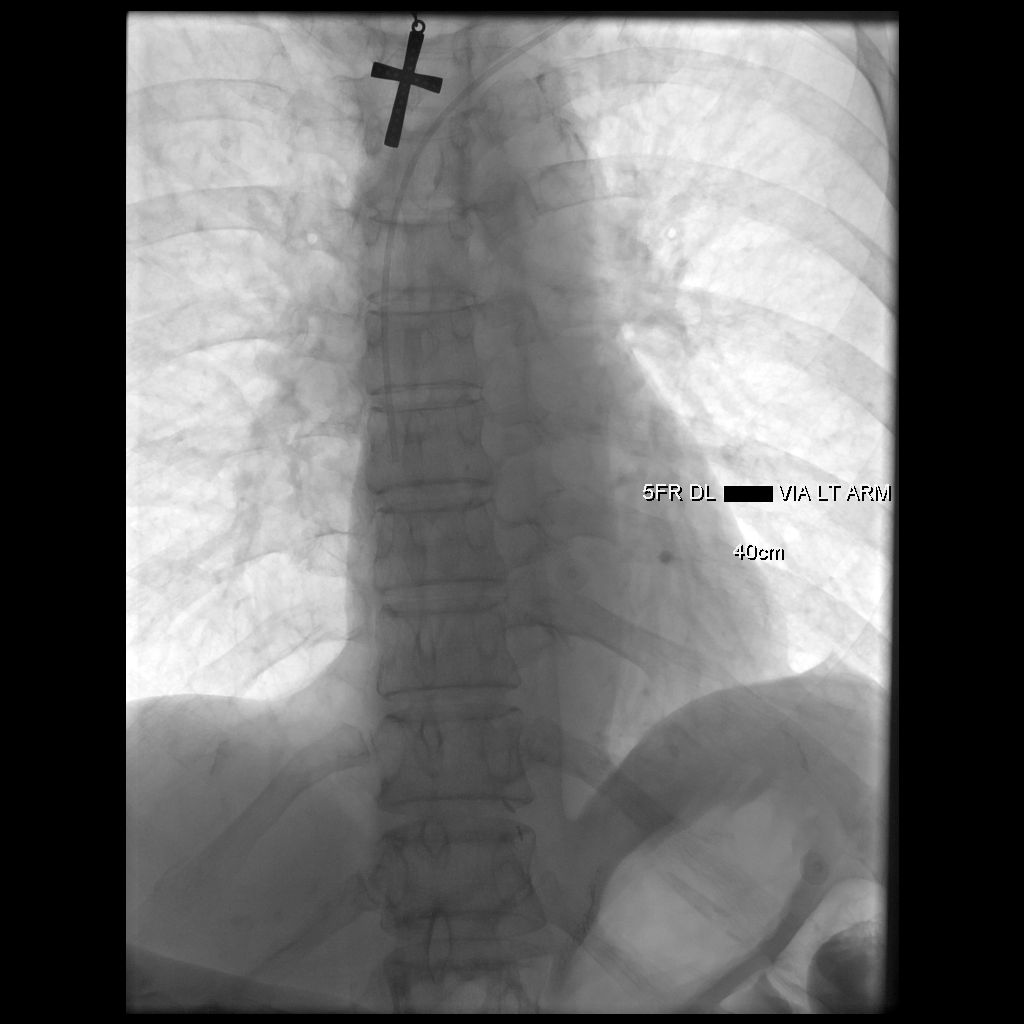

[Series 300: line placements · 1 of 1 slices shown]
[im 1/1]
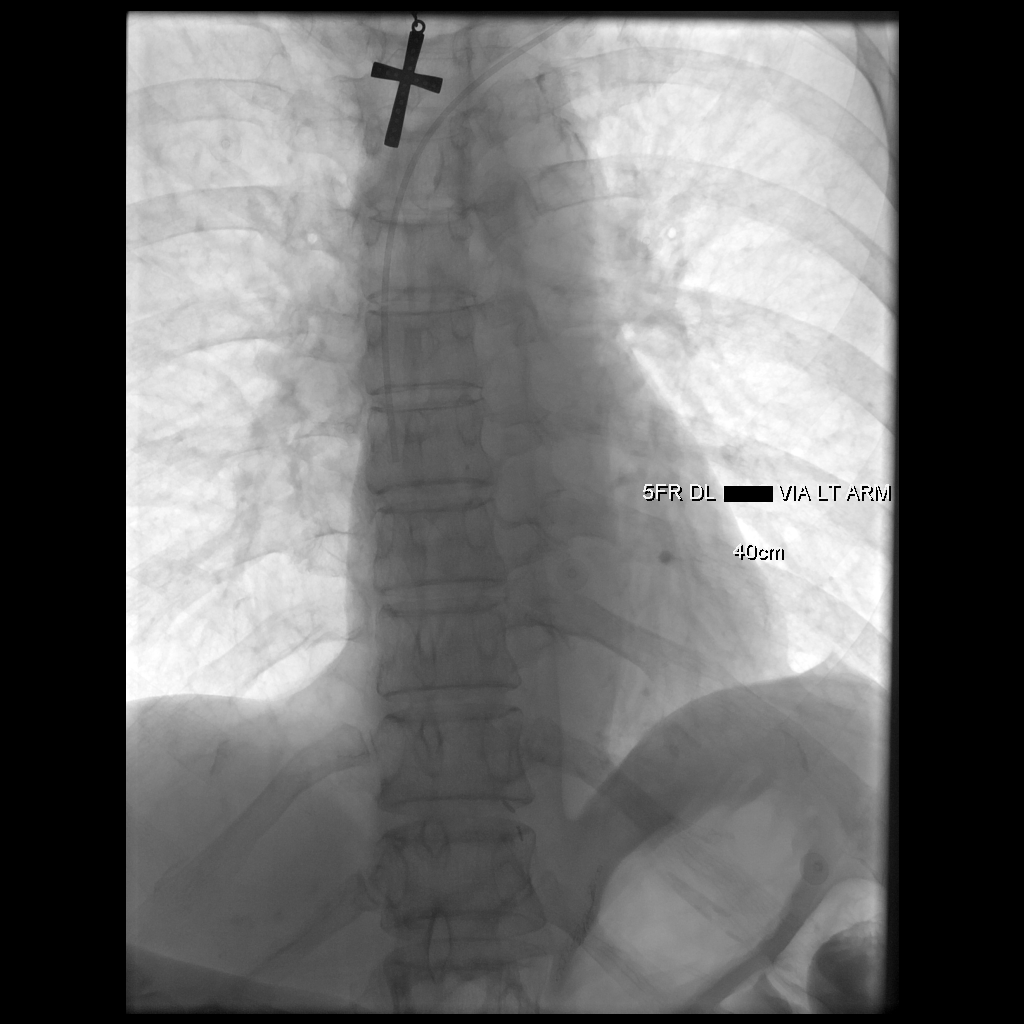

[2 of 2 positions shown; findings below may reference images not displayed]

PICC LINE PLACEMENT WITH ULTRASOUND AND FLUOROSCOPIC  GUIDANCE

Fluoroscopy Time: 0.1 minutes.

The left arm was prepped with chlorhexidine, draped in the usual
sterile fashion using maximum barrier technique (cap and mask,
sterile gown, sterile gloves, large sterile sheet, hand hygiene and
cutaneous antisepsis) and infiltrated locally with 1% Lidocaine.

Ultrasound demonstrated patency of the left basilic vein, and this
was documented with an image.  Under real-time ultrasound guidance,
this vein was accessed with a 21 gauge micropuncture needle and
image documentation was performed.  The needle was exchanged over a
guidewire for a peel-away sheath through which a 5 French double
lumen PICC trimmed to 40 cm was advanced, positioned with its tip
in the lower SVC.  Fluoroscopy during the procedure and fluoro spot
radiograph confirms appropriate catheter position.  The catheter
was flushed, secured to the skin with Prolene sutures, and covered
with a sterile dressing.

Complications:  None
IMPRESSION: Successful left arm PICC line placement with ultrasound and
fluoroscopic guidance.  The catheter is ready for use.

## 2013-09-23 ENCOUNTER — Emergency Department (HOSPITAL_COMMUNITY)
Admission: EM | Admit: 2013-09-23 | Discharge: 2013-09-23 | Disposition: A | Discharge status: Home / Self Care | Payer: Medicaid Other | Attending: Emergency Medicine | Admitting: Emergency Medicine

## 2013-09-23 ENCOUNTER — Encounter (HOSPITAL_COMMUNITY): Payer: Self-pay | Admitting: Emergency Medicine

## 2013-09-23 ENCOUNTER — Emergency Department (HOSPITAL_COMMUNITY): Payer: Medicaid Other

## 2013-09-23 DIAGNOSIS — R112 Nausea with vomiting, unspecified: Secondary | ICD-10-CM | POA: Diagnosis not present

## 2013-09-23 DIAGNOSIS — Z8719 Personal history of other diseases of the digestive system: Secondary | ICD-10-CM | POA: Diagnosis not present

## 2013-09-23 DIAGNOSIS — Z8589 Personal history of malignant neoplasm of other organs and systems: Secondary | ICD-10-CM | POA: Insufficient documentation

## 2013-09-23 DIAGNOSIS — Z79899 Other long term (current) drug therapy: Secondary | ICD-10-CM | POA: Insufficient documentation

## 2013-09-23 DIAGNOSIS — R109 Unspecified abdominal pain: Secondary | ICD-10-CM | POA: Diagnosis not present

## 2013-09-23 DIAGNOSIS — R05 Cough: Secondary | ICD-10-CM | POA: Insufficient documentation

## 2013-09-23 DIAGNOSIS — R059 Cough, unspecified: Secondary | ICD-10-CM | POA: Diagnosis not present

## 2013-09-23 DIAGNOSIS — F17200 Nicotine dependence, unspecified, uncomplicated: Secondary | ICD-10-CM | POA: Insufficient documentation

## 2013-09-23 DIAGNOSIS — J441 Chronic obstructive pulmonary disease with (acute) exacerbation: Secondary | ICD-10-CM | POA: Diagnosis not present

## 2013-09-23 DIAGNOSIS — IMO0002 Reserved for concepts with insufficient information to code with codable children: Secondary | ICD-10-CM | POA: Diagnosis not present

## 2013-09-23 DIAGNOSIS — R1010 Upper abdominal pain, unspecified: Secondary | ICD-10-CM

## 2013-09-23 LAB — CBC WITH DIFFERENTIAL/PLATELET
BASOS ABS: 0 10*3/uL (ref 0.0–0.1)
BASOS PCT: 1 % (ref 0–1)
Eosinophils Absolute: 0 10*3/uL (ref 0.0–0.7)
Eosinophils Relative: 0 % (ref 0–5)
HCT: 38.3 % (ref 36.0–46.0)
HEMOGLOBIN: 13.5 g/dL (ref 12.0–15.0)
Lymphocytes Relative: 15 % (ref 12–46)
Lymphs Abs: 0.7 10*3/uL (ref 0.7–4.0)
MCH: 32.2 pg (ref 26.0–34.0)
MCHC: 35.2 g/dL (ref 30.0–36.0)
MCV: 91.4 fL (ref 78.0–100.0)
MONOS PCT: 10 % (ref 3–12)
Monocytes Absolute: 0.5 10*3/uL (ref 0.1–1.0)
NEUTROS PCT: 74 % (ref 43–77)
Neutro Abs: 3.5 10*3/uL (ref 1.7–7.7)
Platelets: 179 10*3/uL (ref 150–400)
RBC: 4.19 MIL/uL (ref 3.87–5.11)
RDW: 12.4 % (ref 11.5–15.5)
WBC: 4.7 10*3/uL (ref 4.0–10.5)

## 2013-09-23 LAB — COMPREHENSIVE METABOLIC PANEL
ALBUMIN: 4.3 g/dL (ref 3.5–5.2)
ALK PHOS: 103 U/L (ref 39–117)
ALT: 13 U/L (ref 0–35)
AST: 29 U/L (ref 0–37)
Anion gap: 14 (ref 5–15)
BUN: 13 mg/dL (ref 6–23)
CHLORIDE: 100 meq/L (ref 96–112)
CO2: 23 mEq/L (ref 19–32)
Calcium: 10.1 mg/dL (ref 8.4–10.5)
Creatinine, Ser: 0.98 mg/dL (ref 0.50–1.10)
GFR calc Af Amer: 71 mL/min — ABNORMAL LOW (ref >90)
GFR calc non Af Amer: 61 mL/min — ABNORMAL LOW (ref >90)
Glucose, Bld: 136 mg/dL — ABNORMAL HIGH (ref 70–99)
POTASSIUM: 4.9 meq/L (ref 3.7–5.3)
SODIUM: 137 meq/L (ref 137–147)
Total Bilirubin: 0.3 mg/dL (ref 0.3–1.2)
Total Protein: 7.9 g/dL (ref 6.0–8.3)

## 2013-09-23 LAB — LIPASE, BLOOD: Lipase: 20 U/L (ref 11–59)

## 2013-09-23 MED ORDER — ONDANSETRON HCL 4 MG/2ML IJ SOLN
4.0000 mg | Freq: Once | INTRAMUSCULAR | Status: AC
  Administered 2013-09-23: 4 mg via INTRAVENOUS
  Filled 2013-09-23: qty 2

## 2013-09-23 MED ORDER — METHYLPREDNISOLONE SODIUM SUCC 125 MG IJ SOLR
125.0000 mg | Freq: Once | INTRAMUSCULAR | Status: AC
  Administered 2013-09-23: 125 mg via INTRAVENOUS
  Filled 2013-09-23: qty 2

## 2013-09-23 MED ORDER — HYDROMORPHONE HCL 1 MG/ML IJ SOLN
1.0000 mg | Freq: Once | INTRAMUSCULAR | Status: AC
  Administered 2013-09-23: 1 mg via INTRAVENOUS
  Filled 2013-09-23: qty 1

## 2013-09-23 MED ORDER — LORAZEPAM 1 MG PO TABS
1.0000 mg | ORAL_TABLET | Freq: Once | ORAL | Status: AC
  Administered 2013-09-23: 1 mg via ORAL
  Filled 2013-09-23: qty 1

## 2013-09-23 MED ORDER — ALBUTEROL SULFATE HFA 108 (90 BASE) MCG/ACT IN AERS: 2.0000 | INHALATION_SPRAY | Freq: Four times a day (QID) | RESPIRATORY_TRACT | Status: DC | PRN

## 2013-09-23 MED ORDER — SODIUM CHLORIDE 0.9 % IV BOLUS (SEPSIS)
2000.0000 mL | Freq: Once | INTRAVENOUS | Status: AC
  Administered 2013-09-23: 2000 mL via INTRAVENOUS

## 2013-09-23 MED ORDER — LEVALBUTEROL HCL 1.25 MG/0.5ML IN NEBU
1.2500 mg | INHALATION_SOLUTION | Freq: Once | RESPIRATORY_TRACT | Status: AC
  Administered 2013-09-23: 1.25 mg via RESPIRATORY_TRACT
  Filled 2013-09-23: qty 0.5

## 2013-09-23 MED ORDER — PREDNISONE 20 MG PO TABS: 40.0000 mg | ORAL_TABLET | Freq: Every day | ORAL | Status: AC

## 2013-09-23 MED ORDER — HYDROMORPHONE HCL 4 MG PO TABS: 4.0000 mg | ORAL_TABLET | Freq: Four times a day (QID) | ORAL | Status: DC | PRN

## 2013-09-23 NOTE — ED Notes (Signed)
RN attempted IV with [place] with no success. MD informed. MD to try with [place]

## 2013-09-23 NOTE — ED Notes (Signed)
MD Regenia Skeeter attempting IV [place]

## 2013-09-23 NOTE — ED Notes (Signed)
2 RNs have attempted IV.

## 2013-09-23 NOTE — ED Notes (Signed)
Bed: WA10 Expected date: 09/23/13 Expected time: 9:42 AM Means of arrival: Ambulance Comments: abd pain/ SHOB

## 2013-09-23 NOTE — ED Notes (Signed)
Pt from home reports central abd pain that began 4 days ago. Hx of pancreatitis that was diagnosed 4 months ago. Pt reports home dilaudid, and ran out several days ago. Pt has appointment with PCP tomorrow. Pt reports 4 episodes of emesis this am. Pt also reports running out of home albuterol 2 days ago, and is having sob. Pt reports SOB for past 4 month. EMS gave 5mg  albuterol prior to arrival. Pt says it does not help.

## 2013-09-23 NOTE — Progress Notes (Signed)
Pt refused to have ABG done at this time. Pt requesting some anxiety medication first. RN aware.

## 2013-09-23 NOTE — ED Provider Notes (Signed)
CSN: [id number]     Arrival date & time 09/23/13  9024 History   First MD Initiated Contact with Patient 09/23/13 [phone]     Chief Complaint  Patient presents with  . Abdominal Pain  . Shortness of Breath     (Consider location/radiation/quality/duration/timing/severity/associated sxs/prior Treatment) HPI 63 year old female presents with abdominal pain for the past 4 days. She's had nausea and vomiting as well. This feels like her prior pancreatitis. She states that she takes dilaudid orally at home, but ran out yesterday. She has appointment with her PCP tomorrow. She's also complaining of shortness of breath. She has a history of COPD and states that for the past month she's been having worsening shortness of breath. Has had a unchanged and cough. Has low-grade fevers up to 99. Has been taking her albuterol but states is not helping. EMS gave her albuterol nebulizer but states it does not help. Denies any diarrhea. No chest pain, leg swelling, or leg pain.  Past Medical History  Diagnosis Date  . Pancreatitis   . COPD (chronic obstructive pulmonary disease)   . Substance abuse   . Cancer     renal ca  . Seizures   . Pancreatitis    Past Surgical History  Procedure Laterality Date  . Partial gastrectomy     Family History  Problem Relation Age of Onset  . CAD Mother   . Alcoholism Father    History  Substance Use Topics  . Smoking status: Current Every Day Smoker -- 0.50 packs/day    Types: Cigarettes  . Smokeless tobacco: Never Used  . Alcohol Use: Yes     Comment: Hx of Alcohol abuse, reports she last drink was this AM, 24 oz X2 of beer per day   OB History   Grav Para Term Preterm Abortions TAB SAB Ect Mult Living                 Review of Systems  Constitutional: Negative for fever.  Respiratory: Positive for cough, shortness of breath and wheezing.   Cardiovascular: Negative for chest pain.  Gastrointestinal: Positive for nausea, vomiting and abdominal pain.  Negative for diarrhea.  All other systems reviewed and are negative.     Allergies  Aspirin and Chlorpromazine hcl  Home Medications   Prior to Admission medications   Medication Sig Start Date End Date Taking? Authorizing Provider  fluticasone (FLOVENT HFA) 110 MCG/ACT inhaler Inhale 2 puffs into the lungs 2 (two) times daily.    Historical Provider, MD  HYDROmorphone (DILAUDID) 4 MG tablet Take 1 tablet (4 mg total) by mouth every 4 (four) hours as needed for severe pain. 06/06/13   Costin Karlyne Greenspan, MD  lisinopril (PRINIVIL,ZESTRIL) 10 MG tablet Take 1 tablet (10 mg total) by mouth daily. 06/06/13   Costin Karlyne Greenspan, MD  LORazepam (ATIVAN) 1 MG tablet Take 1 tablet (1 mg total) by mouth every 6 (six) hours as needed. For anxiety. 04/01/13   Erline Hau, MD  Multiple Vitamin (MULTIVITAMIN WITH MINERALS) TABS tablet Take 1 tablet by mouth daily. 06/06/13   Costin Karlyne Greenspan, MD  ondansetron (ZOFRAN) 8 MG tablet Take 8 mg by mouth every 8 (eight) hours as needed for nausea or vomiting.    Historical Provider, MD   BP 125/98  Pulse 125  Temp(Src) 97.7 F (36.5 C) (Oral)  Resp 20  SpO2 96% Physical Exam  Nursing note and vitals reviewed. Constitutional: She is oriented to person, place, and time. She appears  cachectic.  HENT:  Head: Normocephalic and atraumatic.  Right Ear: External ear normal.  Left Ear: External ear normal.  Nose: Nose normal.  Eyes: Right eye exhibits no discharge. Left eye exhibits no discharge.  Cardiovascular: Regular rhythm and normal heart sounds.  Tachycardia present.   Pulmonary/Chest: Effort normal. She has wheezes.  Abdominal: Soft. There is tenderness in the epigastric area.  Neurological: She is alert and oriented to person, place, and time.  Skin: Skin is warm and dry.    ED Course  Procedures (including critical care time) Labs Review Labs Reviewed  COMPREHENSIVE METABOLIC PANEL - Abnormal; Notable for the following:    Glucose, Bld  136 (*)    GFR calc non Af Amer 61 (*)    GFR calc Af Amer 71 (*)    All other components within normal limits  CBC WITH DIFFERENTIAL  LIPASE, BLOOD    Imaging Review Dg Chest Portable 1 View  09/23/2013   CLINICAL DATA:  COPD exacerbation.  Smoker.  Renal cancer.  EXAM: PORTABLE CHEST - 1 VIEW  COMPARISON:  06/01/2013.  FINDINGS: Normal sized heart. Clear lungs. The lungs remain hyperexpanded with mildly prominent interstitial markings. Unremarkable bones.  IMPRESSION: No acute abnormality.  Stable changes of COPD.   Electronically Signed   By: Enrique Sack M.D.   On: 09/23/2013 10:34     EKG Interpretation   Date/Time:  Sunday September 23 2013 10:07:31 EDT Ventricular Rate:  131 PR Interval:  125 QRS Duration: 73 QT Interval:  316 QTC Calculation: 466 R Axis:   85 Text Interpretation:  Sinus tachycardia Atrial premature complex Consider  right atrial enlargement Borderline right axis deviation Baseline wander  in lead(s) III aVF V4 V6 No significant change since last tracing  Confirmed by Kerryn Tennant  MD, Chan Sheahan ([id number]) on 09/23/2013 10:13:47 AM      MDM   Final diagnoses:  COPD exacerbation  Pain of upper abdomen    Patient's symptoms were controlled with IV Dilaudid. Her abdominal pain appears to be acute on chronic and I do not feel that further imaging or workup is warranted at this time. Her initial lab work is unremarkable. As for her shortness of breath symptoms it seems this is also acute on chronic. She's not have evidence of pneumonia. I will treat her with albuterol and a steroid burst as an outpatient. She feels comfortable going home with a short course of her Dilaudid and she will followup with her PCP tomorrow for longer term pain management. Her work of breathing is normal at this time she has no hypoxia and is stable for discharge.    Ephraim Hamburger, MD 09/23/13 [phone]

## 2013-09-23 NOTE — ED Notes (Signed)
MD at bedside. 

## 2013-09-23 NOTE — Discharge Instructions (Signed)
Abdominal Pain Many things can cause abdominal pain. Usually, abdominal pain is not caused by a disease and will improve without treatment. It can often be observed and treated at home. Your health care provider will do a physical exam and possibly order blood tests and X-rays to help determine the seriousness of your pain. However, in many cases, more time must pass before a clear cause of the pain can be found. Before that point, your health care provider may not know if you need more testing or further treatment. HOME CARE INSTRUCTIONS  Monitor your abdominal pain for any changes. The following actions may help to alleviate any discomfort you are experiencing:  Only take over-the-counter or prescription medicines as directed by your health care provider.  Do not take laxatives unless directed to do so by your health care provider.  Try a clear liquid diet (broth, tea, or water) as directed by your health care provider. Slowly move to a bland diet as tolerated. SEEK MEDICAL CARE IF:  You have unexplained abdominal pain.  You have abdominal pain associated with nausea or diarrhea.  You have pain when you urinate or have a bowel movement.  You experience abdominal pain that wakes you in the night.  You have abdominal pain that is worsened or improved by eating food.  You have abdominal pain that is worsened with eating fatty foods.  You have a fever. SEEK IMMEDIATE MEDICAL CARE IF:   Your pain does not go away within 2 hours.  You keep throwing up (vomiting).  Your pain is felt only in portions of the abdomen, such as the right side or the left lower portion of the abdomen.  You pass bloody or black tarry stools. MAKE SURE YOU:  Understand these instructions.   Will watch your condition.   Will get help right away if you are not doing well or get worse.  Document Released: 09/30/2004 Document Revised: 12/26/2012 Document Reviewed: 08/30/2012 [facility] Patient Information  2015 [place], [place]. This information is not intended to replace advice given to you by your health care provider. Make sure you discuss any questions you have with your health care provider.    Acute Pancreatitis Acute pancreatitis is a disease in which the pancreas becomes suddenly inflamed. The pancreas is a large gland located behind your stomach. The pancreas produces enzymes that help digest food. The pancreas also releases the hormones glucagon and insulin that help regulate blood sugar. Damage to the pancreas occurs when the digestive enzymes from the pancreas are activated and begin attacking the pancreas before being released into the intestine. Most acute attacks last a couple of days and can cause serious complications. Some people become dehydrated and develop low blood pressure. In severe cases, bleeding into the pancreas can lead to shock and can be life-threatening. The lungs, heart, and kidneys may fail. CAUSES  Pancreatitis can happen to anyone. In some cases, the cause is unknown. Most cases are caused by:  Alcohol abuse.  Gallstones. Other less common causes are:  Certain medicines.  Exposure to certain chemicals.  Infection.  Damage caused by an accident (trauma).  Abdominal surgery. SYMPTOMS   Pain in the upper abdomen that may radiate to the back.  Tenderness and swelling of the abdomen.  Nausea and vomiting. DIAGNOSIS  Your caregiver will perform a physical exam. Blood and stool tests may be done to confirm the diagnosis. Imaging tests may also be done, such as X-rays, CT scans, or an ultrasound of the abdomen. TREATMENT  Treatment usually requires a stay in the hospital. Treatment may include:  Pain medicine.  Fluid replacement through an intravenous line (IV).  Placing a tube in the stomach to remove stomach contents and control vomiting.  Not eating for 3 or 4 days. This gives your pancreas a rest, because enzymes are not being produced that can  cause further damage.  Antibiotic medicines if your condition is caused by an infection.  Surgery of the pancreas or gallbladder. HOME CARE INSTRUCTIONS   Follow the diet advised by your caregiver. This may involve avoiding alcohol and decreasing the amount of fat in your diet.  Eat smaller, more frequent meals. This reduces the amount of digestive juices the pancreas produces.  Drink enough fluids to keep your urine clear or pale yellow.  Only take over-the-counter or prescription medicines as directed by your caregiver.  Avoid drinking alcohol if it caused your condition.  Do not smoke.  Get plenty of rest.  Check your blood sugar at home as directed by your caregiver.  Keep all follow-up appointments as directed by your caregiver. SEEK MEDICAL CARE IF:   You do not recover as quickly as expected.  You develop new or worsening symptoms.  You have persistent pain, weakness, or nausea.  You recover and then have another episode of pain. SEEK IMMEDIATE MEDICAL CARE IF:   You are unable to eat or keep fluids down.  Your pain becomes severe.  You have a fever or persistent symptoms for more than 2 to 3 days.  You have a fever and your symptoms suddenly get worse.  Your skin or the white part of your eyes turn yellow (jaundice).  You develop vomiting.  You feel dizzy, or you faint.  Your blood sugar is high (over 300 mg/dL). MAKE SURE YOU:   Understand these instructions.  Will watch your condition.  Will get help right away if you are not doing well or get worse. Document Released: 12/21/2004 Document Revised: 06/22/2011 Document Reviewed: 04/01/2011 [facility] Patient Information 2015 [place], [place]. This information is not intended to replace advice given to you by your health care provider. Make sure you discuss any questions you have with your health care provider.    Chronic Obstructive Pulmonary Disease Chronic obstructive pulmonary disease (COPD) is a  common lung condition in which airflow from the lungs is limited. COPD is a general term that can be used to describe many different lung problems that limit airflow, including both chronic bronchitis and emphysema. If you have COPD, your lung function will probably never return to normal, but there are measures you can take to improve lung function and make yourself feel better.  CAUSES   Smoking (common).   Exposure to secondhand smoke.   Genetic problems.  Chronic inflammatory lung diseases or recurrent infections. SYMPTOMS   Shortness of breath, especially with physical activity.   Deep, persistent (chronic) cough with a large amount of thick mucus.   Wheezing.   Rapid breaths (tachypnea).   Gray or bluish discoloration (cyanosis) of the skin, especially in fingers, toes, or lips.   Fatigue.   Weight loss.   Frequent infections or episodes when breathing symptoms become much worse (exacerbations).   Chest tightness. DIAGNOSIS  Your health care provider will take a medical history and perform a physical examination to make the initial diagnosis. Additional tests for COPD may include:   Lung (pulmonary) function tests.  Chest X-ray.  CT scan.  Blood tests. TREATMENT  Treatment available to help you  feel better when you have COPD includes:   Inhaler and nebulizer medicines. These help manage the symptoms of COPD and make your breathing more comfortable.  Supplemental oxygen. Supplemental oxygen is only helpful if you have a low oxygen level in your blood.   Exercise and physical activity. These are beneficial for nearly all people with COPD. Some people may also benefit from a pulmonary rehabilitation program. HOME CARE INSTRUCTIONS   Take all medicines (inhaled or pills) as directed by your health care provider.  Avoid over-the-counter medicines or cough syrups that dry up your airway (such as antihistamines) and slow down the elimination of  secretions unless instructed otherwise by your health care provider.   If you are a smoker, the most important thing that you can do is stop smoking. Continuing to smoke will cause further lung damage and breathing trouble. Ask your health care provider for help with quitting smoking. He or she can direct you to community resources or hospitals that provide support.  Avoid exposure to irritants such as smoke, chemicals, and fumes that aggravate your breathing.  Use oxygen therapy and pulmonary rehabilitation if directed by your health care provider. If you require home oxygen therapy, ask your health care provider whether you should purchase a pulse oximeter to measure your oxygen level at home.   Avoid contact with individuals who have a contagious illness.  Avoid extreme temperature and humidity changes.  Eat healthy foods. Eating smaller, more frequent meals and resting before meals may help you maintain your strength.  Stay active, but balance activity with periods of rest. Exercise and physical activity will help you maintain your ability to do things you want to do.  Preventing infection and hospitalization is very important when you have COPD. Make sure to receive all the vaccines your health care provider recommends, especially the pneumococcal and influenza vaccines. Ask your health care provider whether you need a pneumonia vaccine.  Learn and use relaxation techniques to manage stress.  Learn and use controlled breathing techniques as directed by your health care provider. Controlled breathing techniques include:   Pursed lip breathing. Start by breathing in (inhaling) through your nose for 1 second. Then, purse your lips as if you were going to whistle and breathe out (exhale) through the pursed lips for 2 seconds.   Diaphragmatic breathing. Start by putting one hand on your abdomen just above your waist. Inhale slowly through your nose. The hand on your abdomen should move  out. Then purse your lips and exhale slowly. You should be able to feel the hand on your abdomen moving in as you exhale.   Learn and use controlled coughing to clear mucus from your lungs. Controlled coughing is a series of short, progressive coughs. The steps of controlled coughing are:  1. Lean your head slightly forward.  2. Breathe in deeply using diaphragmatic breathing.  3. Try to hold your breath for 3 seconds.  4. Keep your mouth slightly open while coughing twice.  5. Spit any mucus out into a tissue.  6. Rest and repeat the steps once or twice as needed. SEEK MEDICAL CARE IF:   You are coughing up more mucus than usual.   There is a change in the color or thickness of your mucus.   Your breathing is more labored than usual.   Your breathing is faster than usual.  SEEK IMMEDIATE MEDICAL CARE IF:   You have shortness of breath while you are resting.   You have shortness of  breath that prevents you from:  Being able to talk.   Performing your usual physical activities.   You have chest pain lasting longer than 5 minutes.   Your skin color is more cyanotic than usual.  You measure low oxygen saturations for longer than 5 minutes with a pulse oximeter. MAKE SURE YOU:   Understand these instructions.  Will watch your condition.  Will get help right away if you are not doing well or get worse. Document Released: 09/30/2004 Document Revised: 05/07/2013 Document Reviewed: 08/17/2012 [facility] Patient Information 2015 [place], [place]. This information is not intended to replace advice given to you by your health care provider. Make sure you discuss any questions you have with your health care provider.

## 2013-09-24 ENCOUNTER — Encounter (HOSPITAL_COMMUNITY): Payer: Self-pay | Admitting: Emergency Medicine

## 2013-09-24 ENCOUNTER — Emergency Department (HOSPITAL_COMMUNITY)
Admission: EM | Admit: 2013-09-24 | Discharge: 2013-09-24 | Discharge status: Against Medical Advice | Payer: Medicaid Other | Attending: Emergency Medicine | Admitting: Emergency Medicine

## 2013-09-24 DIAGNOSIS — R112 Nausea with vomiting, unspecified: Secondary | ICD-10-CM | POA: Diagnosis not present

## 2013-09-24 DIAGNOSIS — R109 Unspecified abdominal pain: Secondary | ICD-10-CM | POA: Insufficient documentation

## 2013-09-24 DIAGNOSIS — F17200 Nicotine dependence, unspecified, uncomplicated: Secondary | ICD-10-CM | POA: Diagnosis not present

## 2013-09-24 NOTE — ED Notes (Signed)
Per EMS: Pt c/o abd pain, n/v, pt was here yesterday for same.

## 2013-09-24 NOTE — ED Notes (Signed)
Pt called from triage, no answer

## 2013-09-24 NOTE — ED Notes (Signed)
No answer from the lobby, third call

## 2013-09-24 NOTE — ED Notes (Signed)
Called Pt 3 times no response.

## 2013-09-24 NOTE — ED Notes (Signed)
Pt called for the second time from triage with no answer

## 2013-09-27 ENCOUNTER — Telehealth: Payer: Self-pay | Admitting: *Deleted

## 2013-10-20 IMAGING — CT CT HEAD W/O CM
2 of 4 series · 16 of 30 positions shown, 18 images · non-contrast
Comparison: CT head without contrast 06/13/2010.

CLINICAL DATA: Code stroke.  Slurred speech.

CT HEAD WITHOUT CONTRAST
TECHNIQUE: Contiguous axial images were obtained from the base of
the skull through the vertex without contrast.

[Series 2: head w/o · axial · non-contrast · 0.49mm/px · z∈[+118,+243]mm · 8 of 33 slices shown, 10 images]
[im 4/33  brain]
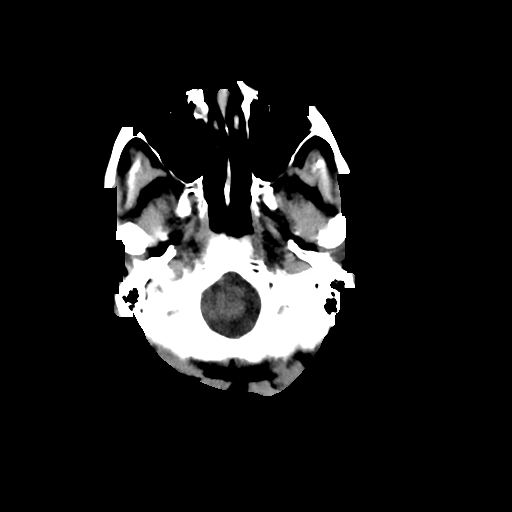
[im 4/33  bone]
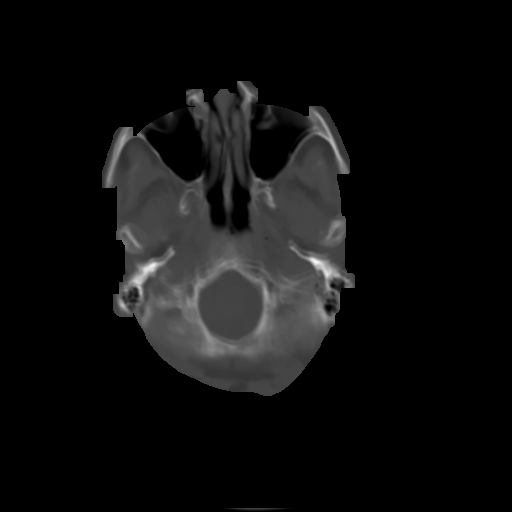
[im 8/33  brain]
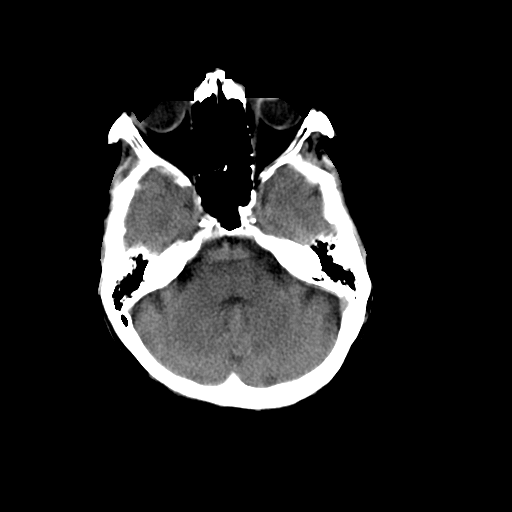
[im 11/33  brain]
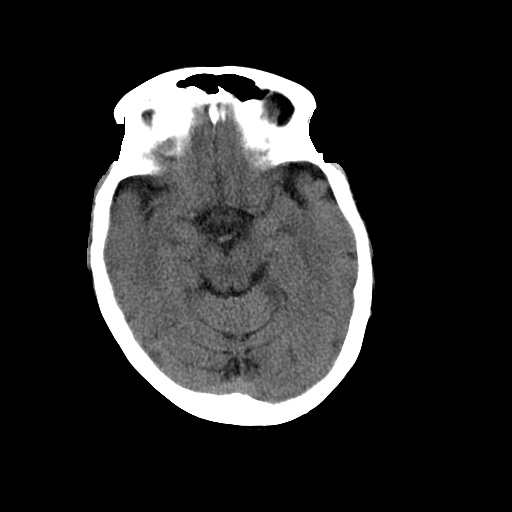
[im 15/33  brain]
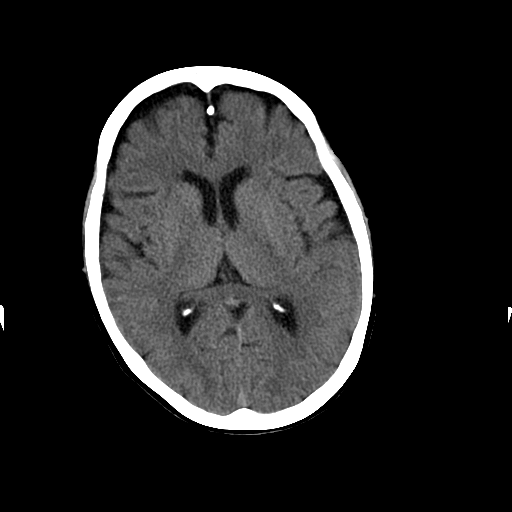
[im 18/33  brain]
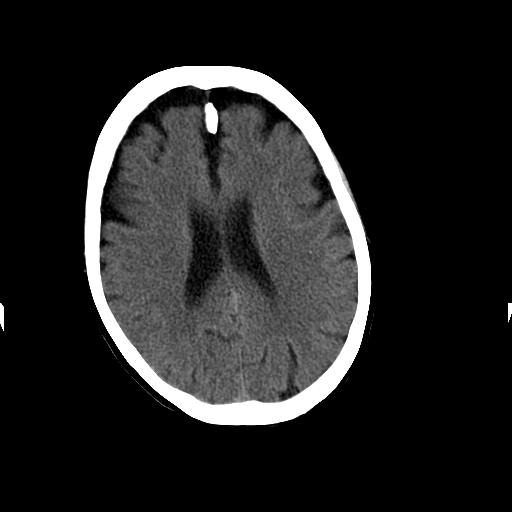
[im 18/33  bone]
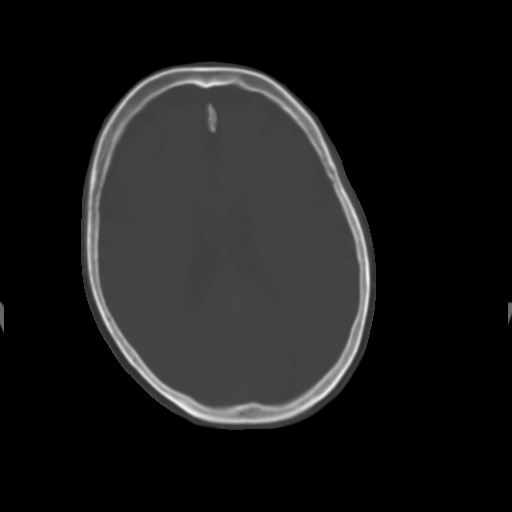
[im 22/33  brain]
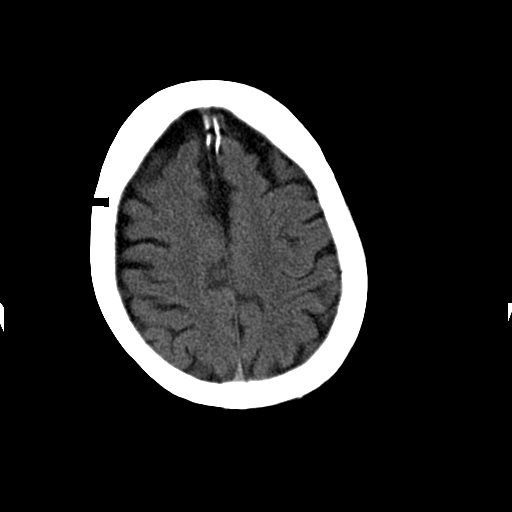
[im 25/33  brain]
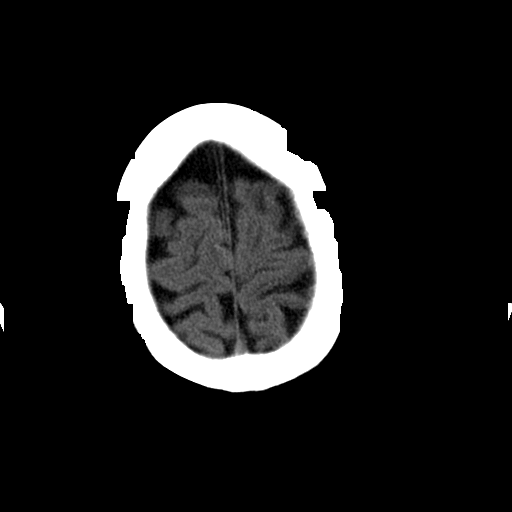
[im 29/33  brain]
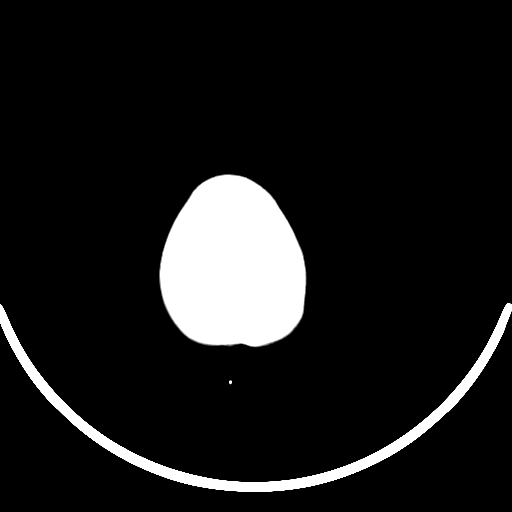

[Series 3: head w/o bone · axial · non-contrast · 0.49mm/px · z∈[+118,+243]mm · 8 of 33 slices shown]
[im 4/33  bone]
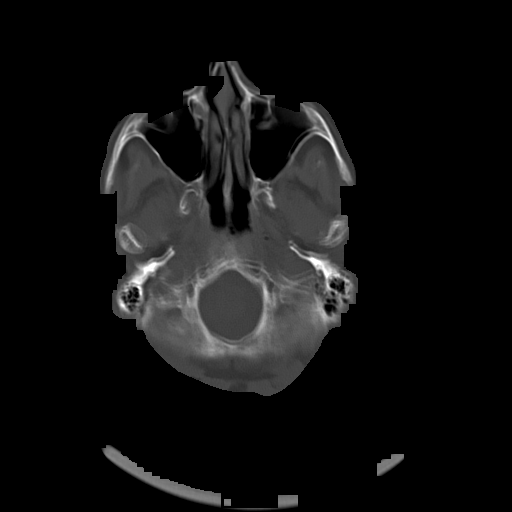
[im 8/33  bone]
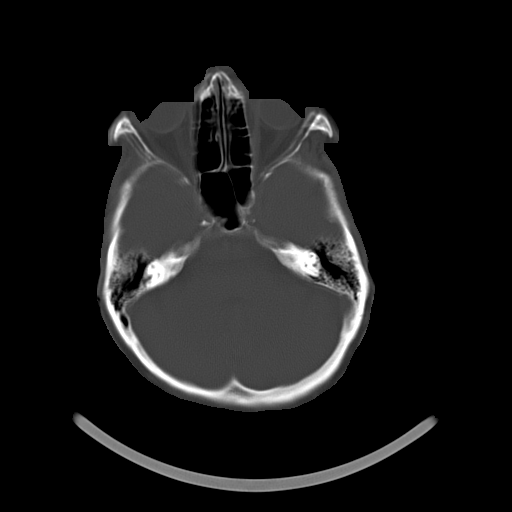
[im 11/33  bone]
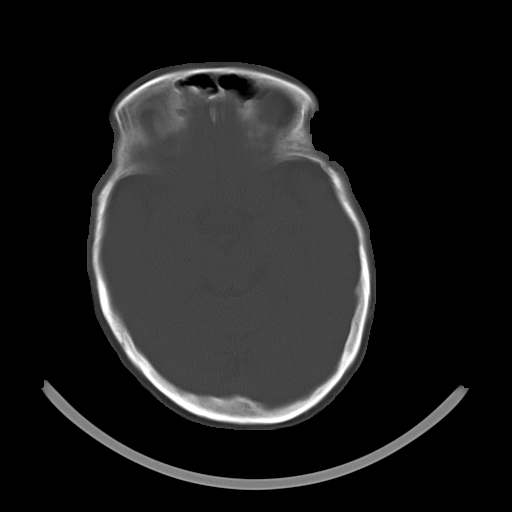
[im 15/33  bone]
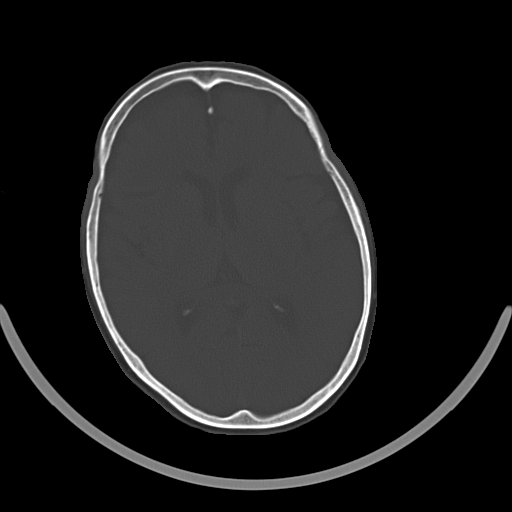
[im 18/33  bone]
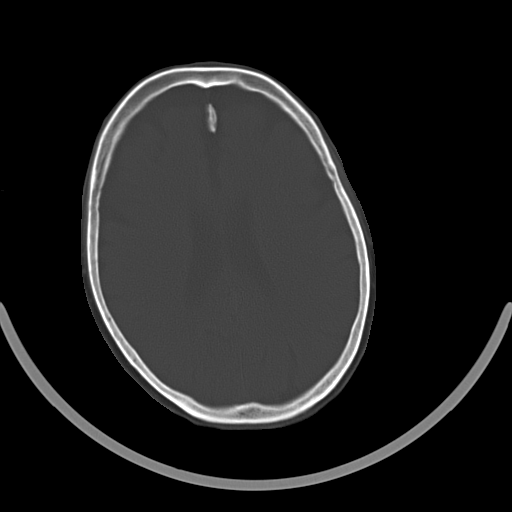
[im 22/33  bone]
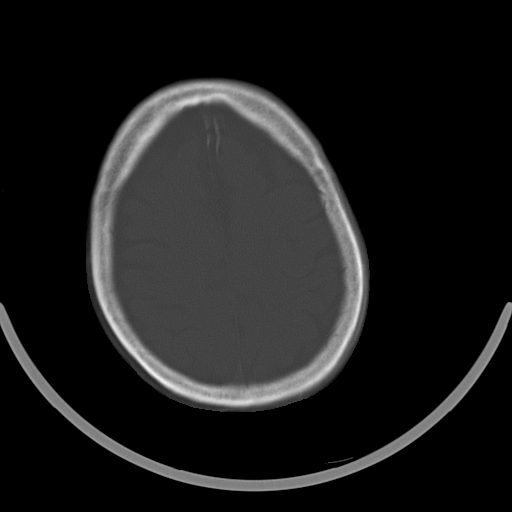
[im 25/33  bone]
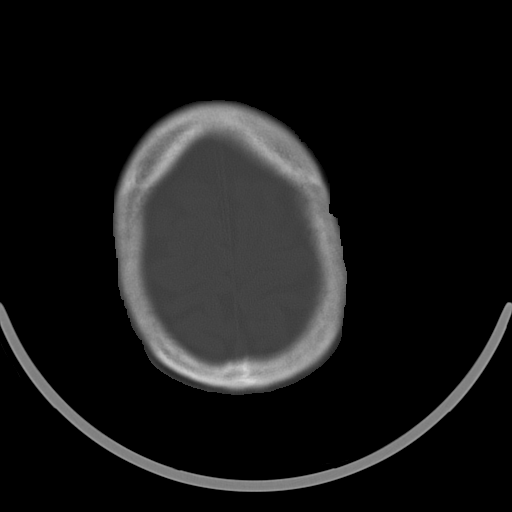
[im 29/33  bone]
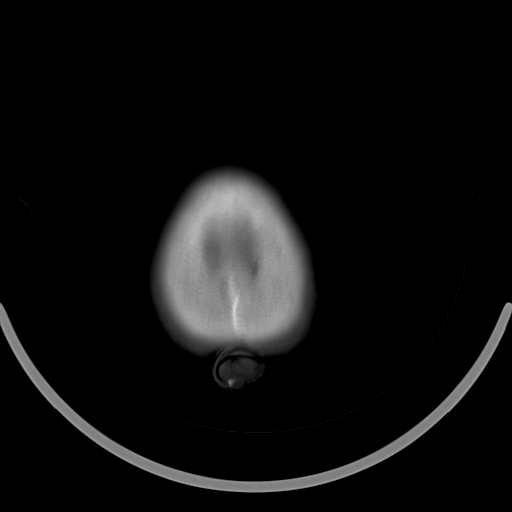

[16 of 30 positions shown; findings below may reference images not displayed]

FINDINGS: The study is mildly degraded by patient motion.  Mild generalized
atrophy is stable.  No acute cortical infarct, hemorrhage, mass
lesion is present.  The ventricles are proportionate to the degree
of atrophy.  Minimal periventricular white matter hypoattenuation
is similar to the prior exam.

The paranasal sinuses and mastoid air cells are clear.  The
atherosclerotic calcifications are again noted within the cavernous
carotid arteries bilaterally.
IMPRESSION: 1.  No acute intracranial abnormality or significant interval
change.
2.  Mild generalized atrophy.
3.  The study is mildly degraded by patient motion throughout.

These results were called by telephone on 04/07/2011  at  [DATE]
p.m. to  Dr. German, who verbally acknowledged these results.

## 2013-10-30 NOTE — Telephone Encounter (Signed)
Encounter closed

## 2013-12-05 ENCOUNTER — Other Ambulatory Visit (HOSPITAL_COMMUNITY): Payer: Self-pay | Admitting: Interventional Radiology

## 2013-12-05 ENCOUNTER — Other Ambulatory Visit: Payer: Self-pay | Admitting: Emergency Medicine

## 2013-12-05 DIAGNOSIS — C642 Malignant neoplasm of left kidney, except renal pelvis: Secondary | ICD-10-CM

## 2013-12-05 DIAGNOSIS — I878 Other specified disorders of veins: Secondary | ICD-10-CM

## 2013-12-11 ENCOUNTER — Ambulatory Visit (HOSPITAL_COMMUNITY)
Admission: RE | Admit: 2013-12-11 | Discharge: 2013-12-11 | Disposition: A | Discharge status: Home / Self Care | Payer: Medicaid Other | Source: Ambulatory Visit | Attending: Interventional Radiology | Admitting: Interventional Radiology

## 2013-12-11 ENCOUNTER — Other Ambulatory Visit (HOSPITAL_COMMUNITY): Payer: Self-pay | Admitting: Interventional Radiology

## 2013-12-11 DIAGNOSIS — C642 Malignant neoplasm of left kidney, except renal pelvis: Secondary | ICD-10-CM

## 2013-12-11 DIAGNOSIS — I878 Other specified disorders of veins: Secondary | ICD-10-CM

## 2013-12-11 LAB — POCT I-STAT, CHEM 8
BUN: 13 mg/dL (ref 6–23)
CALCIUM ION: 1.17 mmol/L (ref 1.13–1.30)
CHLORIDE: 97 meq/L (ref 96–112)
Creatinine, Ser: 0.9 mg/dL (ref 0.50–1.10)
GLUCOSE: 96 mg/dL (ref 70–99)
HEMATOCRIT: 40 % (ref 36.0–46.0)
Hemoglobin: 13.6 g/dL (ref 12.0–15.0)
Potassium: 4.7 mEq/L (ref 3.7–5.3)
Sodium: 131 mEq/L — ABNORMAL LOW (ref 137–147)
TCO2: 23 mmol/L (ref 0–100)

## 2013-12-11 LAB — POCT I-STAT CREATININE: Creatinine, Ser: 1 mg/dL (ref 0.50–1.10)

## 2013-12-11 MED ORDER — LIDOCAINE HCL 1 % IJ SOLN
INTRAMUSCULAR | Status: AC
  Filled 2013-12-11: qty 20

## 2013-12-11 MED ORDER — IOHEXOL 300 MG/ML  SOLN
100.0000 mL | Freq: Once | INTRAMUSCULAR | Status: AC | PRN
  Administered 2013-12-11: 100 mL via INTRAVENOUS

## 2013-12-20 ENCOUNTER — Inpatient Hospital Stay: Admission: RE | Admit: 2013-12-20 | Payer: Medicaid Other | Source: Ambulatory Visit

## 2013-12-20 IMAGING — CR DG CHEST 1V PORT
1 series · 1 of 1 positions shown · non-contrast
Comparison: 12/01/2010

CLINICAL DATA: Cough, abdominal pain

PORTABLE CHEST - 1 VIEW

[AP]
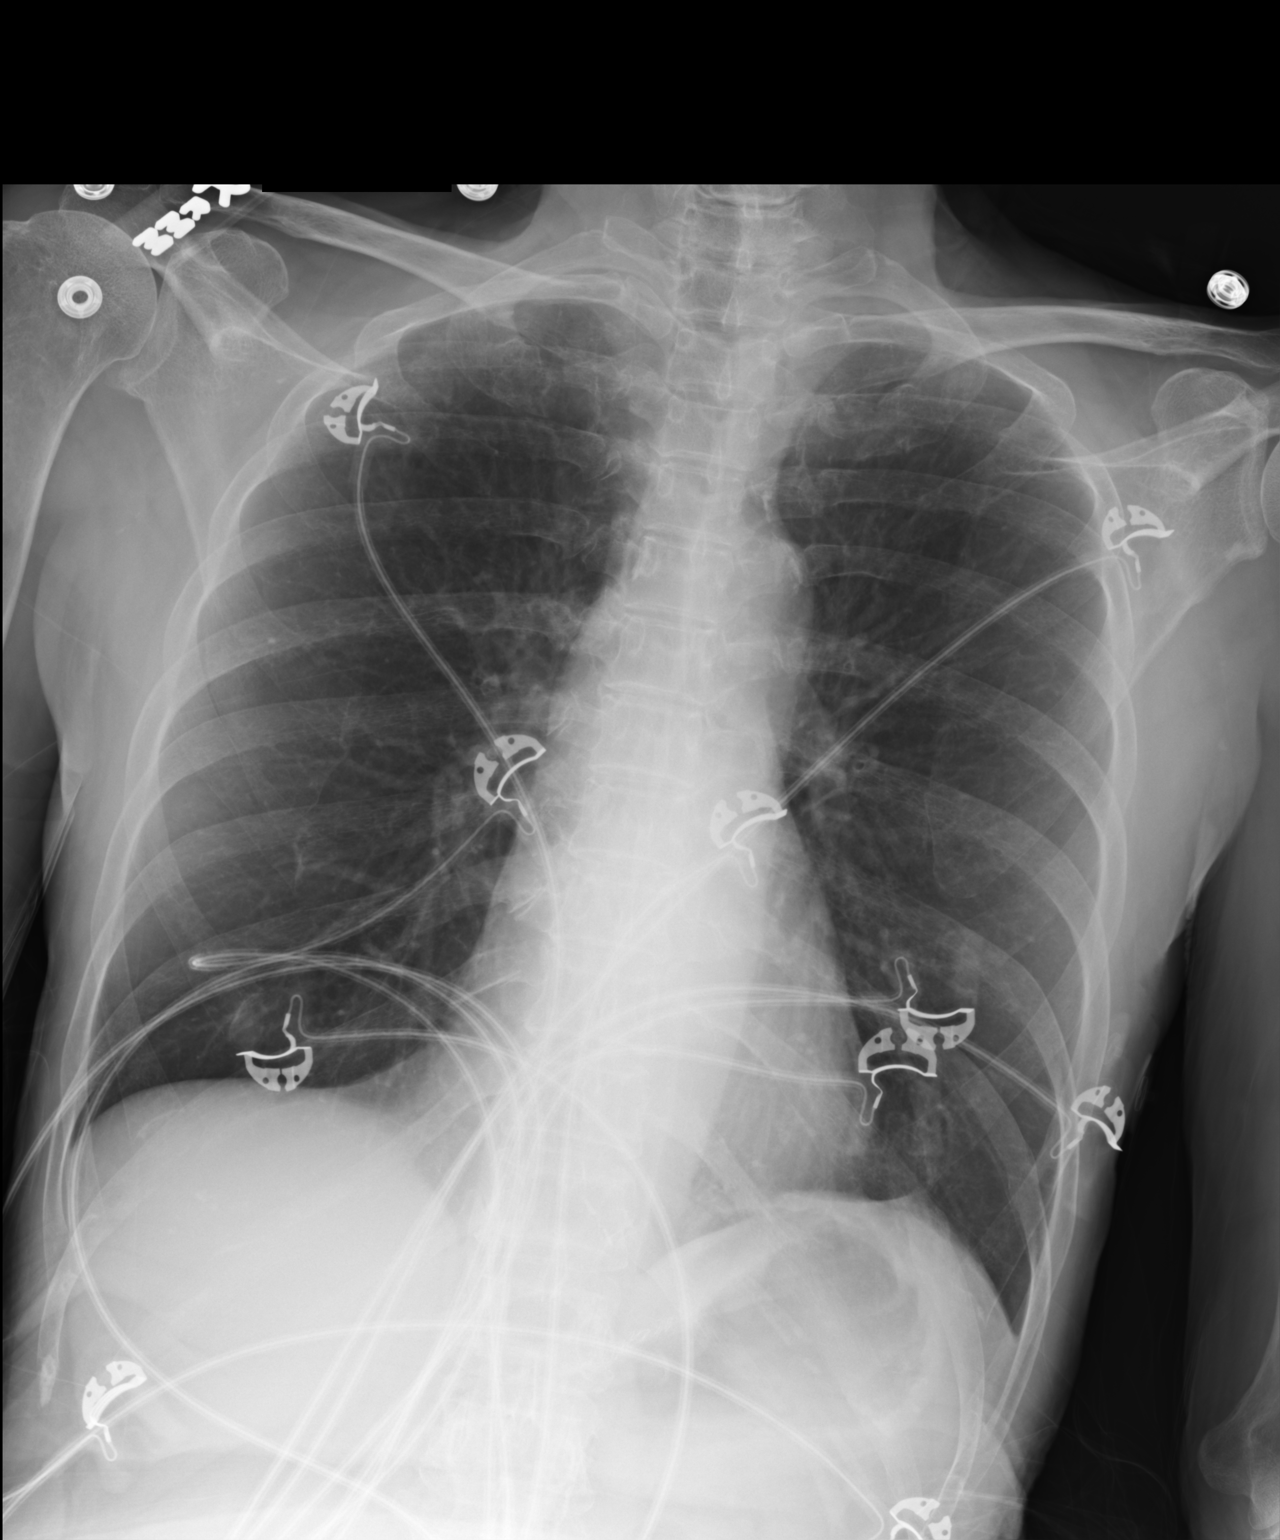

[1 of 1 positions shown; findings below may reference images not displayed]

FINDINGS: The right upper lobe infiltrate seen previously has
resolved.  Lungs are clear, mildly hyperinflated, with somewhat
attenuated bronchovascular markings.  Heart size normal.  No
effusion.
IMPRESSION: 1.  No acute disease

## 2013-12-20 IMAGING — CT CT HEAD W/O CM
2 series · 16 of 30 positions shown, 20 images · non-contrast
Comparison: 04/07/2011

CLINICAL DATA: Code stroke.  Garbled speech.  Confusion.

CT HEAD WITHOUT CONTRAST
TECHNIQUE: Contiguous axial images were obtained from the base of
the skull through the vertex without contrast.

[Series 2: head w/o · axial · non-contrast · 0.42mm/px · z∈[+162,+292]mm · 13 of 32 slices shown, 17 images]
[im 3/32  brain]
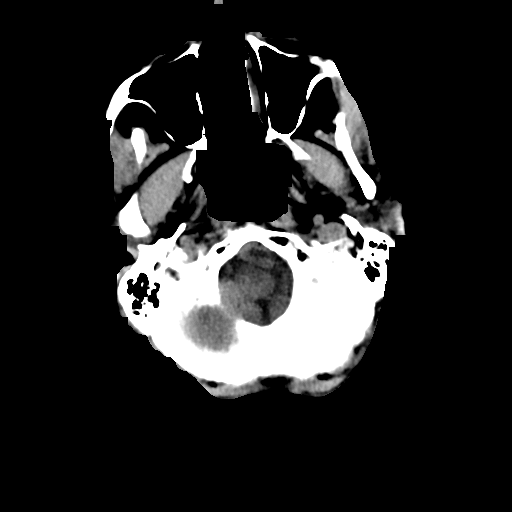
[im 3/32  bone]
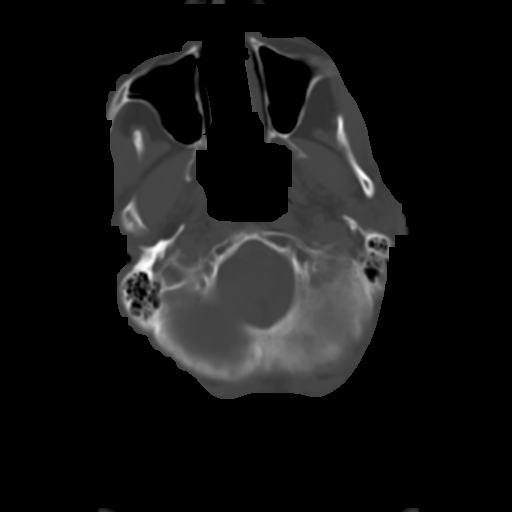
[im 5/32  brain]
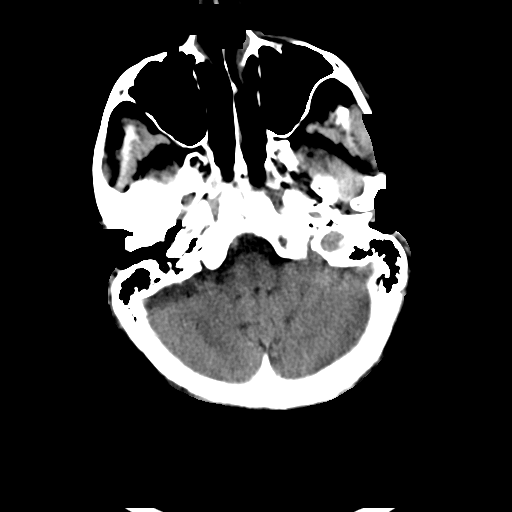
[im 7/32  brain]
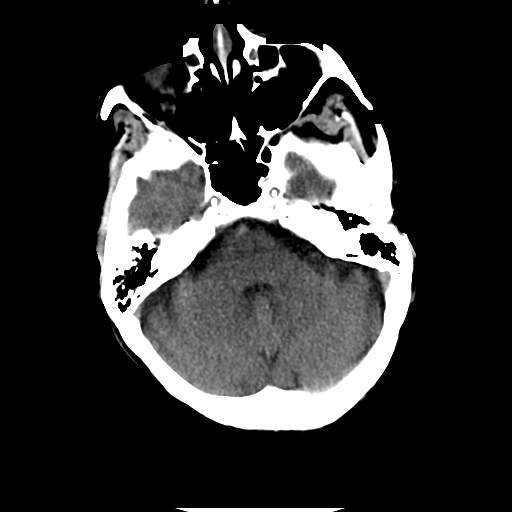
[im 9/32  brain]
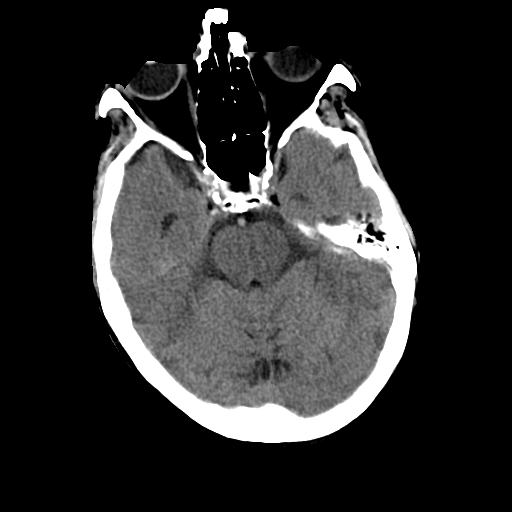
[im 12/32  brain]
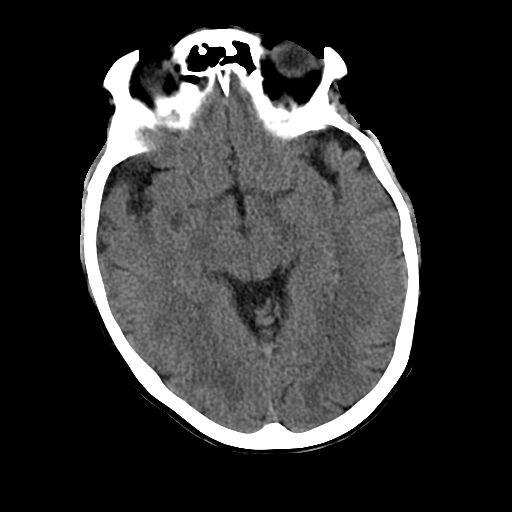
[im 12/32  bone]
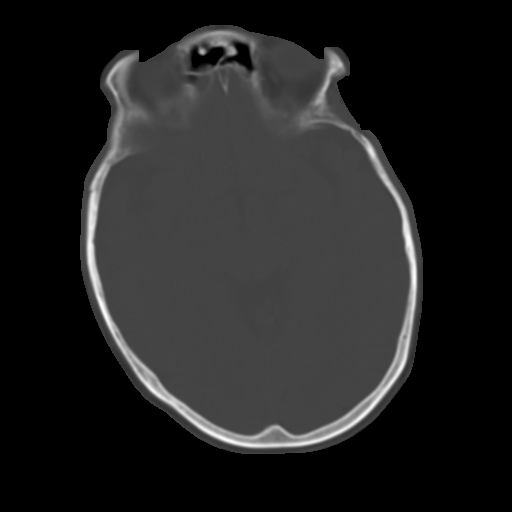
[im 14/32  brain]
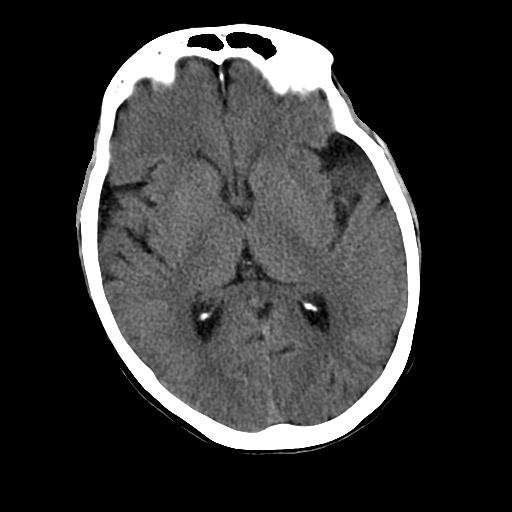
[im 16/32  brain]
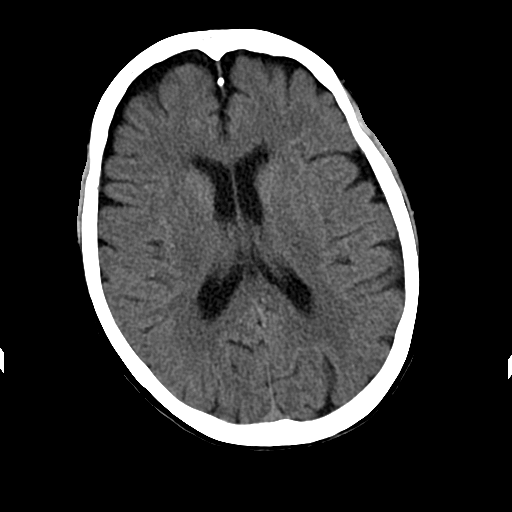
[im 18/32  brain]
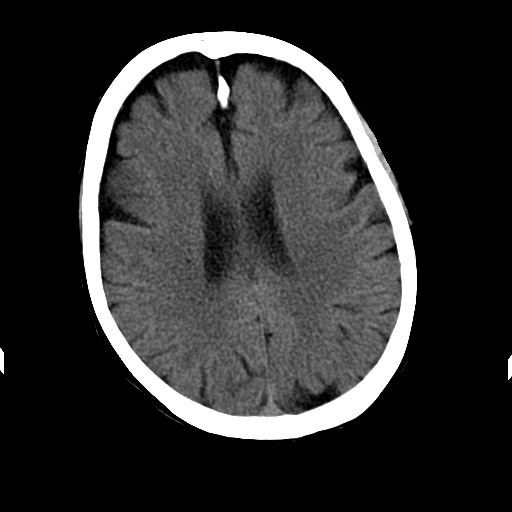
[im 20/32  brain]
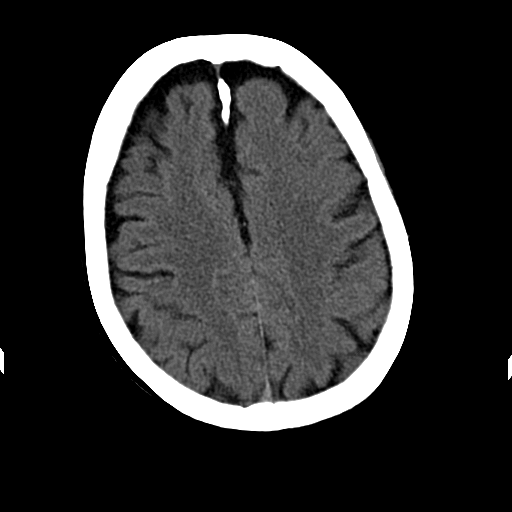
[im 20/32  bone]
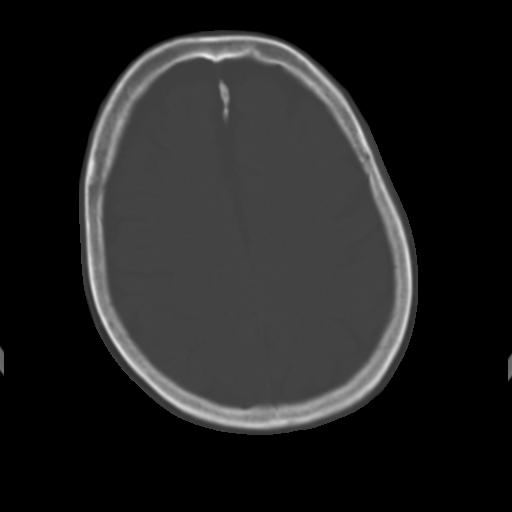
[im 23/32  brain]
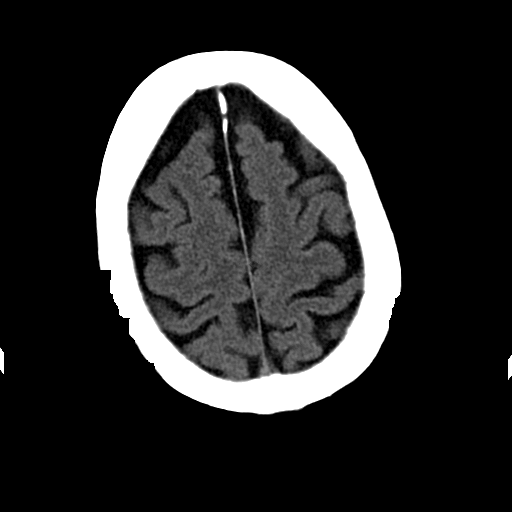
[im 25/32  brain]
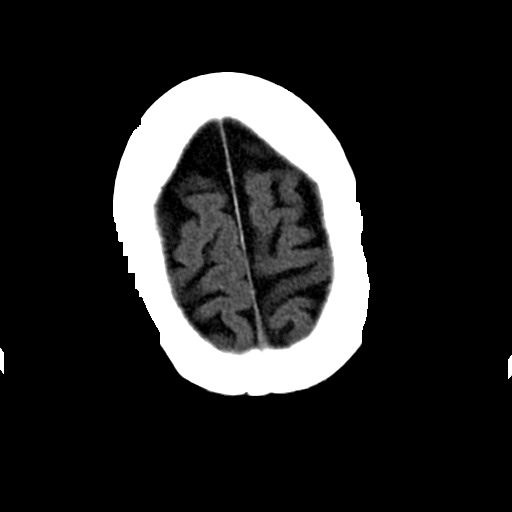
[im 27/32  brain]
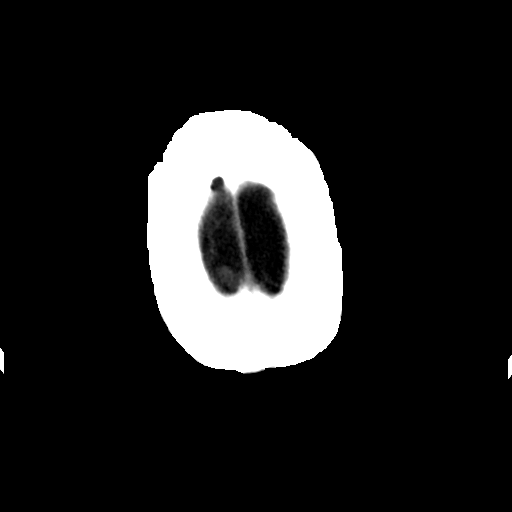
[im 29/32  brain]
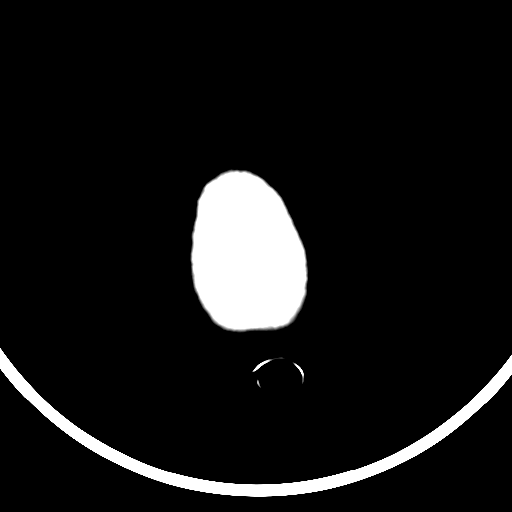
[im 29/32  bone]
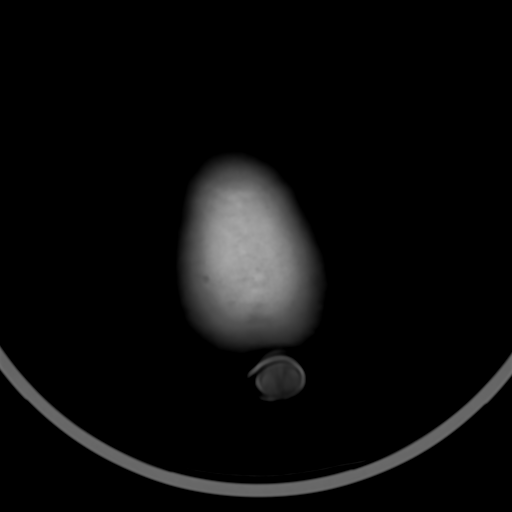

[Series 3: head w/o bone · axial · non-contrast · 0.42mm/px · z∈[+162,+207]mm · 3 of 32 slices shown]
[im 3/32  bone]
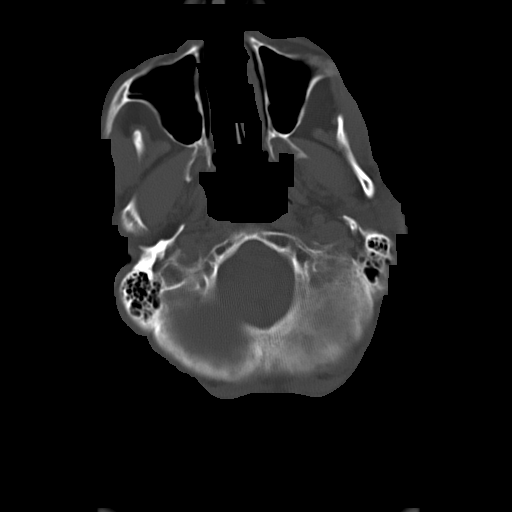
[im 7/32  bone]
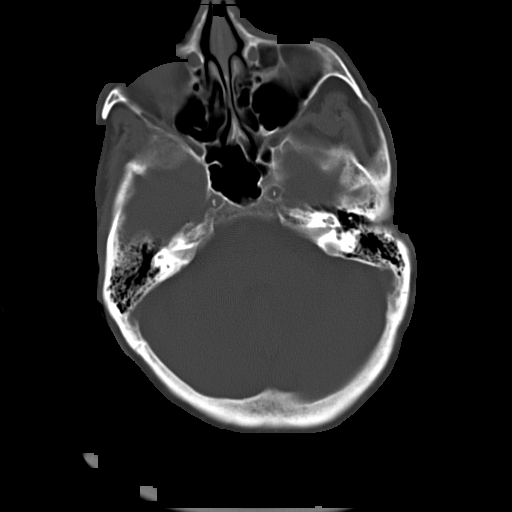
[im 12/32  bone]
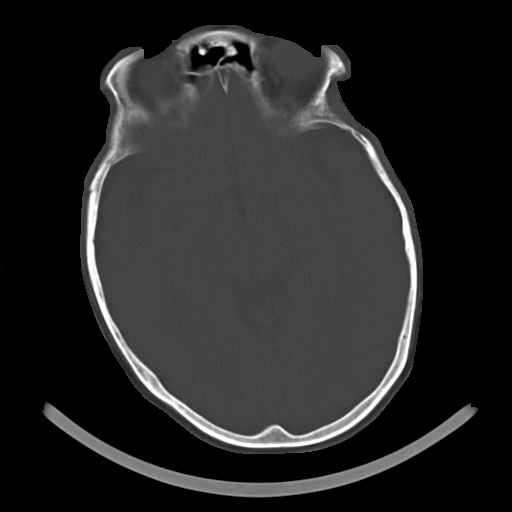

[16 of 30 positions shown; findings below may reference images not displayed]

FINDINGS: There is no evidence for acute hemorrhage, hydrocephalus,
mass lesion, or abnormal extra-axial fluid collection.  No definite
CT evidence for acute infarction.
The visualized paranasal sinuses and mastoid air cells are clear.
IMPRESSION: Stable.  No acute intracranial findings.

Atrophy.

These findings were called by me to Dr. Wieneke at to [id number] hours on
06/07/2011.

## 2014-01-08 ENCOUNTER — Ambulatory Visit
Admission: RE | Admit: 2014-01-08 | Discharge: 2014-01-08 | Disposition: A | Discharge status: Home / Self Care | Payer: Medicaid Other | Source: Ambulatory Visit | Attending: Interventional Radiology | Admitting: Interventional Radiology

## 2014-01-08 DIAGNOSIS — C642 Malignant neoplasm of left kidney, except renal pelvis: Secondary | ICD-10-CM

## 2014-01-08 DIAGNOSIS — I878 Other specified disorders of veins: Secondary | ICD-10-CM

## 2014-01-08 HISTORY — PX: IR GENERIC HISTORICAL: IMG1180011

## 2014-01-08 NOTE — Progress Notes (Signed)
Chief Complaint: Chief Complaint  Patient presents with  . Follow-up    follor up Cryoablation of Left Renal Cell Carcinoma   History of Present Illness: [name] is a 64 y.o. female who is status post original radiofrequency ablation of a left renal carcinoma on 01/18/2007 and cryoablation of renal carcinoma recurrence in the left kidney on 05/15/2010. Follow-up is performed. The patient has had no recent urinary symptoms. She has chronic abdominal pain related to chronic pancreatitis.  Past Medical History  Diagnosis Date  . Pancreatitis   . COPD (chronic obstructive pulmonary disease)   . Substance abuse   . Cancer     renal ca  . Seizures   . Pancreatitis     Past Surgical History  Procedure Laterality Date  . Partial gastrectomy      Allergies: Aspirin; Chlorpromazine hcl; and Codeine  Medications: Prior to Admission medications   Medication Sig Start Date End Date Taking? Authorizing Provider  albuterol (PROVENTIL HFA;VENTOLIN HFA) 108 (90 BASE) MCG/ACT inhaler Inhale 2 puffs into the lungs every 6 (six) hours as needed for wheezing or shortness of breath. 09/23/13  Yes Ephraim Hamburger, MD  HYDROmorphone (DILAUDID) 4 MG tablet Take 1 tablet (4 mg total) by mouth every 6 (six) hours as needed for severe pain. 09/23/13  Yes Ephraim Hamburger, MD  lisinopril (PRINIVIL,ZESTRIL) 10 MG tablet Take 1 tablet (10 mg total) by mouth daily. 06/06/13  Yes Costin Karlyne Greenspan, MD  LORazepam (ATIVAN) 1 MG tablet Take 1 tablet (1 mg total) by mouth every 6 (six) hours as needed. For anxiety. 04/01/13  Yes Erline Hau, MD  ondansetron (ZOFRAN) 8 MG tablet Take 8 mg by mouth every 8 (eight) hours as needed for nausea or vomiting.   Yes Historical Provider, MD  Multiple Vitamin (MULTIVITAMIN WITH MINERALS) TABS tablet Take 1 tablet by mouth daily. Patient not taking: Reported on 01/08/2014 06/06/13   Caren Griffins, MD    Family History  Problem Relation Age of Onset  . CAD  Mother   . Alcoholism Father     History   Social History  . Marital Status: Married    Spouse Name: N/A    Number of Children: N/A  . Years of Education: N/A   Social History Main Topics  . Smoking status: Current Every Day Smoker -- 0.50 packs/day    Types: Cigarettes  . Smokeless tobacco: Never Used  . Alcohol Use: Yes     Comment: Hx of Alcohol abuse, reports she last drink was this AM, 24 oz X2 of beer per day  . Drug Use: Yes     Comment: Hx of polysubstance drug abuse  . Sexual Activity: No   Other Topics Concern  . Not on file   Social History Narrative  . No narrative on file    Review of Systems: A 12 point ROS discussed and pertinent positives are indicated in the HPI above.  All other systems are negative.  Review of Systems  Constitutional: Positive for fatigue. Negative for fever, chills and diaphoresis.  Respiratory: Negative.   Cardiovascular: Negative.   Gastrointestinal: Positive for nausea and abdominal pain. Negative for diarrhea, constipation, blood in stool and abdominal distention.  Genitourinary: Negative.   Musculoskeletal: Positive for back pain.    Vital Signs: BP 99/87 mmHg  Pulse 65  Temp(Src) 98.1 F (36.7 C) (Oral)  Resp 14  Ht 5\' 4"  (1.626 m)  Wt 89 lb (40.37 kg)  BMI  15.27 kg/m2  SpO2 91%  Physical Exam  Constitutional: No distress.  Cardiovascular: Normal rate, regular rhythm and normal heart sounds.   Pulmonary/Chest: Effort normal and breath sounds normal.  Abdominal: Soft. She exhibits no distension. There is no tenderness.  Vitals reviewed.   Imaging: Ir Fluoro Guide Cv Line Right  12/11/2013   CLINICAL DATA:  Poor peripheral IV access and need for PICC line for contrast administration for CT.  EXAM: POWER PICC LINE PLACEMENT WITH ULTRASOUND AND FLUOROSCOPIC GUIDANCE  FLUOROSCOPY TIME:  Dictate in minutes & seconds  PROCEDURE: The patient was advised of the possible risks and complications and agreed to undergo the  procedure. The patient was then brought to the angiographic suite for the procedure.  The left arm was prepped with chlorhexidine, draped in the usual sterile fashion using maximum barrier technique (cap and mask, sterile gown, sterile gloves, large sterile sheet, hand hygiene and cutaneous antisepsis) and infiltrated locally with 1% Lidocaine. A timeout was performed.  Ultrasound demonstrated patency of the left basilic vein, and this was documented with an image. Under real-time ultrasound guidance, this vein was accessed with a 21 gauge micropuncture needle and image documentation was performed. A 0.018 wire was introduced in to the vein. Over this, a 5.0 [place] single lumen power injectable PICC was advanced to the lower SVC/right atrial junction. Fluoroscopy during the procedure and fluoro spot radiograph confirms appropriate catheter position. The catheter was flushed and covered with a sterile dressing.  Catheter length: 39 cm  COMPLICATIONS: None  IMPRESSION: Successful left arm power injectable PICC line placement with ultrasound and fluoroscopic guidance. The catheter is ready for use.   Electronically Signed   By: Aletta Edouard M.D.   On: 12/11/2013 09:24   Ir US Guide Vasc Access Right  12/11/2013   CLINICAL DATA:  Poor peripheral IV access and need for PICC line for contrast administration for CT.  EXAM: POWER PICC LINE PLACEMENT WITH ULTRASOUND AND FLUOROSCOPIC GUIDANCE  FLUOROSCOPY TIME:  Dictate in minutes & seconds  PROCEDURE: The patient was advised of the possible risks and complications and agreed to undergo the procedure. The patient was then brought to the angiographic suite for the procedure.  The left arm was prepped with chlorhexidine, draped in the usual sterile fashion using maximum barrier technique (cap and mask, sterile gown, sterile gloves, large sterile sheet, hand hygiene and cutaneous antisepsis) and infiltrated locally with 1% Lidocaine. A timeout was performed.  Ultrasound  demonstrated patency of the left basilic vein, and this was documented with an image. Under real-time ultrasound guidance, this vein was accessed with a 21 gauge micropuncture needle and image documentation was performed. A 0.018 wire was introduced in to the vein. Over this, a 5.0 [place] single lumen power injectable PICC was advanced to the lower SVC/right atrial junction. Fluoroscopy during the procedure and fluoro spot radiograph confirms appropriate catheter position. The catheter was flushed and covered with a sterile dressing.  Catheter length: 39 cm  COMPLICATIONS: None  IMPRESSION: Successful left arm power injectable PICC line placement with ultrasound and fluoroscopic guidance. The catheter is ready for use.   Electronically Signed   By: Aletta Edouard M.D.   On: 12/11/2013 09:24   Ct Abd Wo & W Cm  12/11/2013   CLINICAL DATA:  Left renal cell carcinoma. Status post cryo ablation.  EXAM: CT ABDOMEN WITHOUT AND WITH CONTRAST  TECHNIQUE: Multidetector CT imaging of the abdomen was performed following the standard protocol before and following the bolus  administration of intravenous contrast.  CONTRAST:  16mL OMNIPAQUE IOHEXOL 300 MG/ML  SOLN  COMPARISON:  06/02/2013.  FINDINGS: Lower chest: The lung bases are clear. No pleural effusion identified.  Hepatobiliary: No focal liver abnormality. The gallbladder appears normal. Increase caliber of the common bile duct measuring 8 mm, image 47 of series 602.  Pancreas: Appears normal.  Spleen: Appears normal.  Adrenals/Urinary Tract: Normal adrenal glands. Normal appearance of the right kidney. Cryoablation defect along the medial aspect of the inferior pole of left kidney measures 1.9 x 2.6 cm, image 33/series 8. On the previous exam this mesh 2.1 x 2.8 cm. No significant enhancement following IV contrast administration. No perinephric fluid collections identified. The left renal artery and vein appear patent.  Stomach/Bowel: The stomach is within normal  limits. The visualized small bowel loops have a normal course and caliber. No obstruction. Normal appearance of the visualized portions of colon.  Vascular/Lymphatic: Calcified atherosclerotic disease involves the abdominal aorta. No aneurysm. No enlarged retroperitoneal or mesenteric adenopathy. No enlarged pelvic or inguinal lymph nodes.  Other: There is no ascites or focal fluid collections within the abdomen or pelvis.  Musculoskeletal: Bones are osteopenic. There is scoliosis and degenerative disc disease noted. No aggressive lytic or sclerotic bone lesions identified.  IMPRESSION: 1. No acute findings identified. 2. Stable cryoablation site along the medial aspect of the inferior pole of left kidney. No specific features identified to suggest residual/recurrence of tumor.   Electronically Signed   By: Kerby Moors M.D.   On: 12/11/2013 13:17    Labs:  CBC:  Recent Labs  03/30/13 0540 06/01/13 2209 06/02/13 0350 09/23/13 1204 12/11/13 0929  WBC 5.1 4.7 5.9 4.7  --   HGB 11.5* 12.9 11.2* 13.5 13.6  HCT 34.7* 37.3 33.8* 38.3 40.0  PLT 97* 143* 113* 179  --     COAGS:  Recent Labs  03/09/13 1821 06/04/13 0342  INR 0.93 0.94    BMP:  Recent Labs  06/04/13 0342  06/05/13 0830 06/06/13 0530 09/23/13 1204 12/11/13 0929  NA 141  --  137 138 137 131*  K 2.6*  < > 3.5* 3.7 4.9 4.7  CL 103  --  103 107 100 97  CO2 26  --  24 20 23   --   GLUCOSE 88  --  89 88 136* 96  BUN 6  --  6 7 13 13   CALCIUM 8.6  --  8.7 8.6 10.1  --   CREATININE 0.63  --  0.78 0.91 0.98 0.90  1.00  GFRNONAA >90  --  89* 67* 61*  --   GFRAA >90  --  >90 78* 71*  --   < > = values in this interval not displayed.  LIVER FUNCTION TESTS:  Recent Labs  06/01/13 2100 06/03/13 0810 06/04/13 0342 09/23/13 1204  BILITOT 0.3 0.3 0.2* 0.3  AST 45* 77* 38* 29  ALT 37* 60* 46* 13  ALKPHOS 123* 108 101 103  PROT 8.2 6.2 5.9* 7.9  ALBUMIN 3.8 3.1* 2.9* 4.3    TUMOR MARKERS: No results for  input(s): AFPTM, CEA, CA199, CHROMGRNA in the last 8760 hours.  Assessment and Plan:  CT shows no evidence of left renal carcinoma recurrence following 2 previous ablation procedures. She is 3-1/2 years status post cryoablation of the renal carcinoma recurrence. No new renal lesions are identified. Renal function is stable and normal. I have recommended additional CT follow-up in approximately one year.  I spent a total  of 15 minutes face to face in clinical consultation, greater than 50% of which was counseling/coordinating care for left renal carcinoma post ablation.  SignedAletta Edouard T 01/08/2014, 5:17 PM   Eulas Post T. Kathlene Cote, M.D. Pager:  [phone]

## 2014-02-27 IMAGING — CR DG FOOT COMPLETE 3+V*L*
3 series · 3 of 3 positions shown · non-contrast
Comparison: Left ankle x-rays obtained concurrently.

CLINICAL DATA: Fell yesterday and injured left foot.

LEFT FOOT - COMPLETE 3+ VIEW

[x foot obl left]
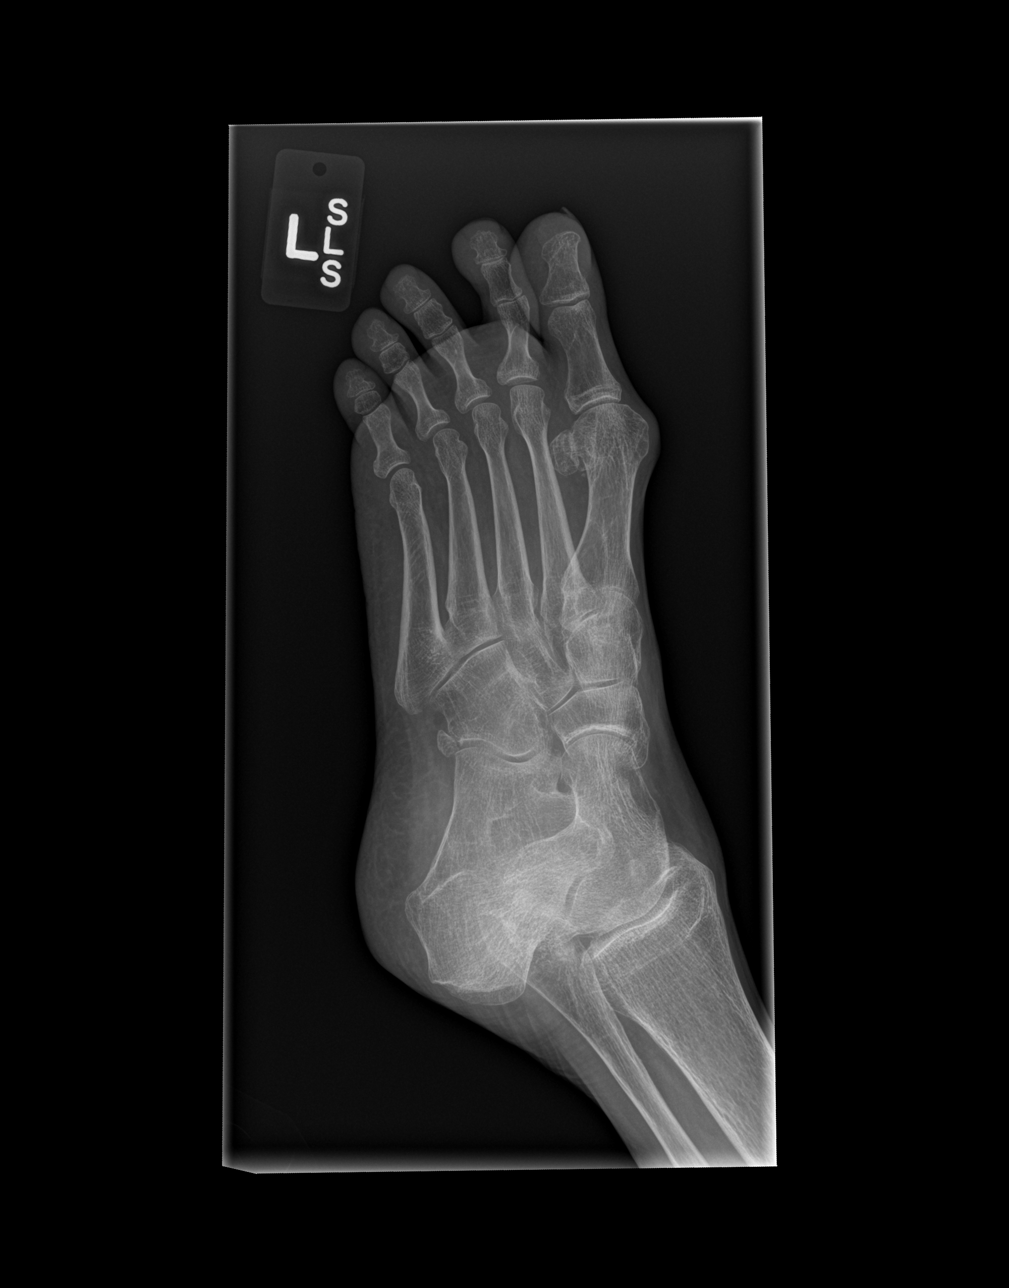

[x foot lat left (1 of 2)]
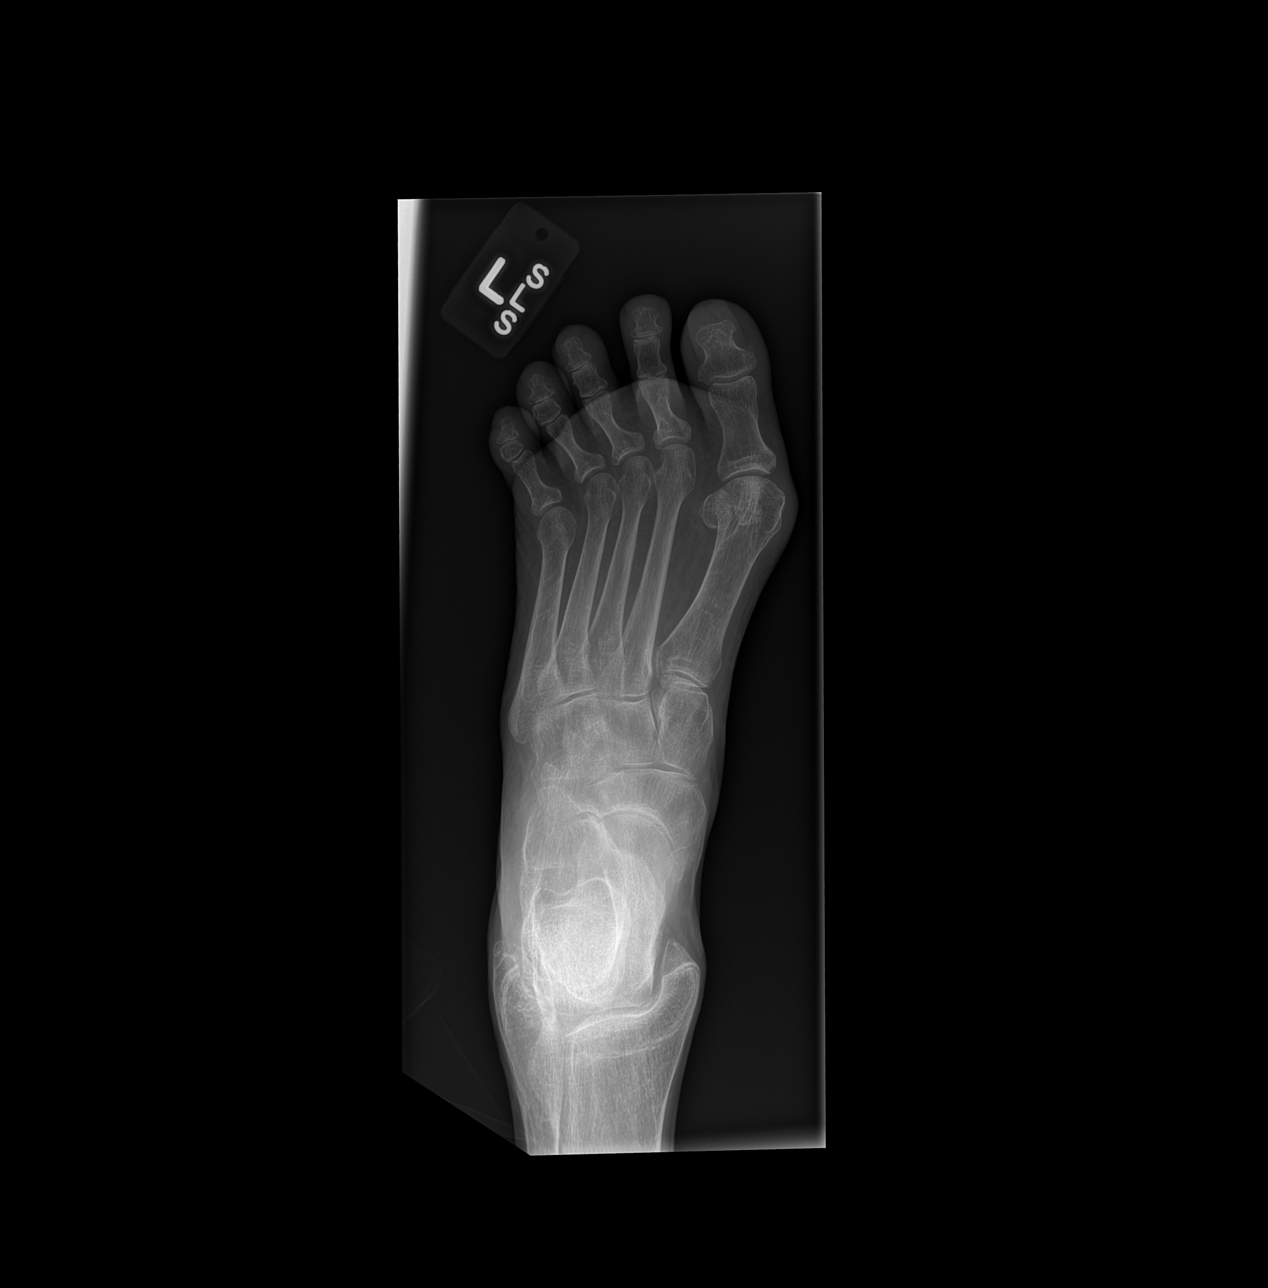

[x foot lat left (2 of 2)]
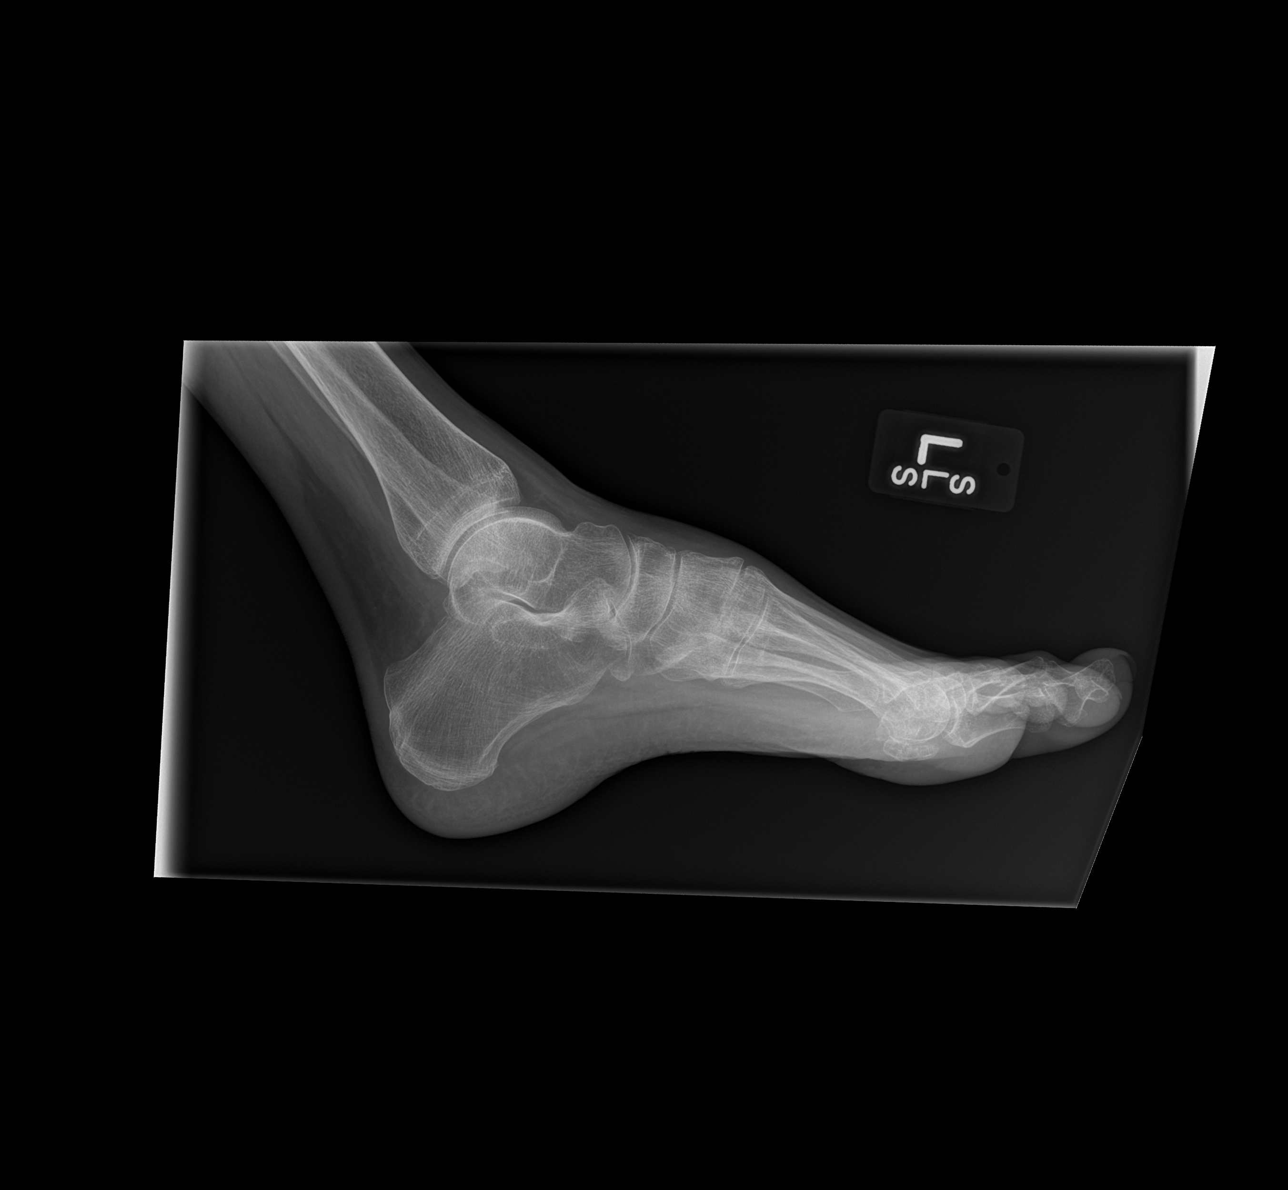

[3 of 3 positions shown; findings below may reference images not displayed]

FINDINGS: No evidence of acute fracture or dislocation.  Slight
lateral subluxation involving all of the MTP joints which does not
appear acute and is not associated with erosions.  Severe
osteopenia.  Mild joint space narrowing involving IP joints of
multiple toes.  Remaining joint spaces well preserved.
IMPRESSION: No acute osseous abnormality.  Severe osteopenia.  Mild
osteoarthritis involving IP joints in multiple toes with well-
preserved joint spaces elsewhere.

## 2014-02-27 IMAGING — CR DG ANKLE COMPLETE 3+V*L*
3 series · 3 of 3 positions shown · non-contrast
Comparison: Left foot x-rays obtained concurrently.

CLINICAL DATA: Fell yesterday, persistent left ankle pain.  Pain
localizes to both the medial and lateral malleoli.

LEFT ANKLE COMPLETE - 3+ VIEW

[x ankle ap left]
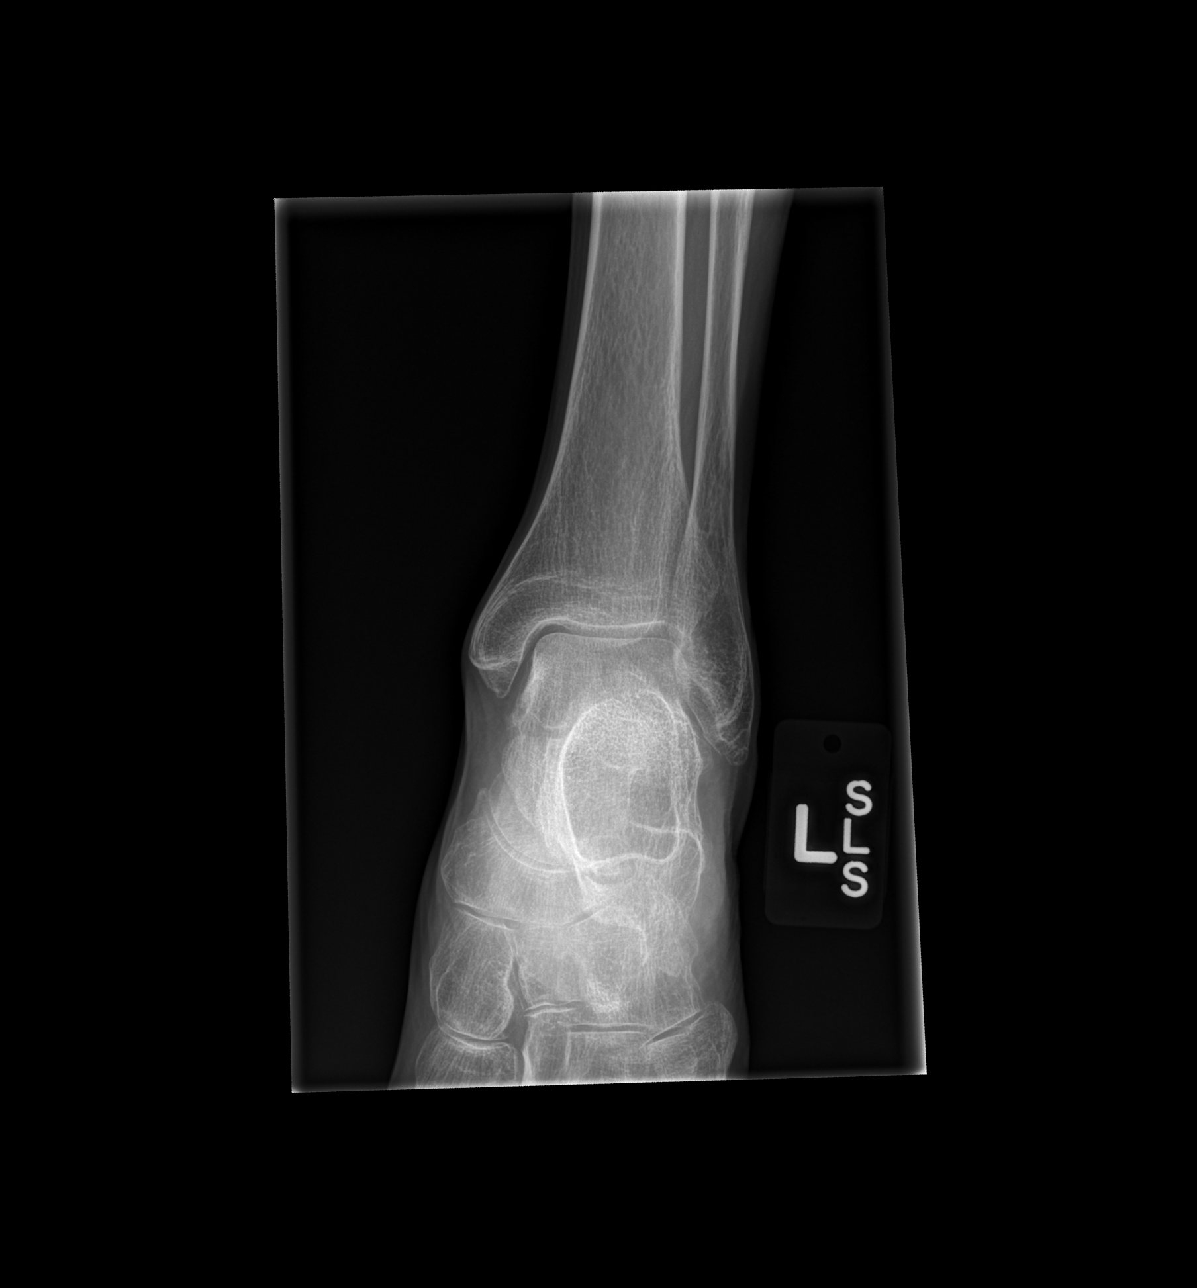

[x ankle obl left]
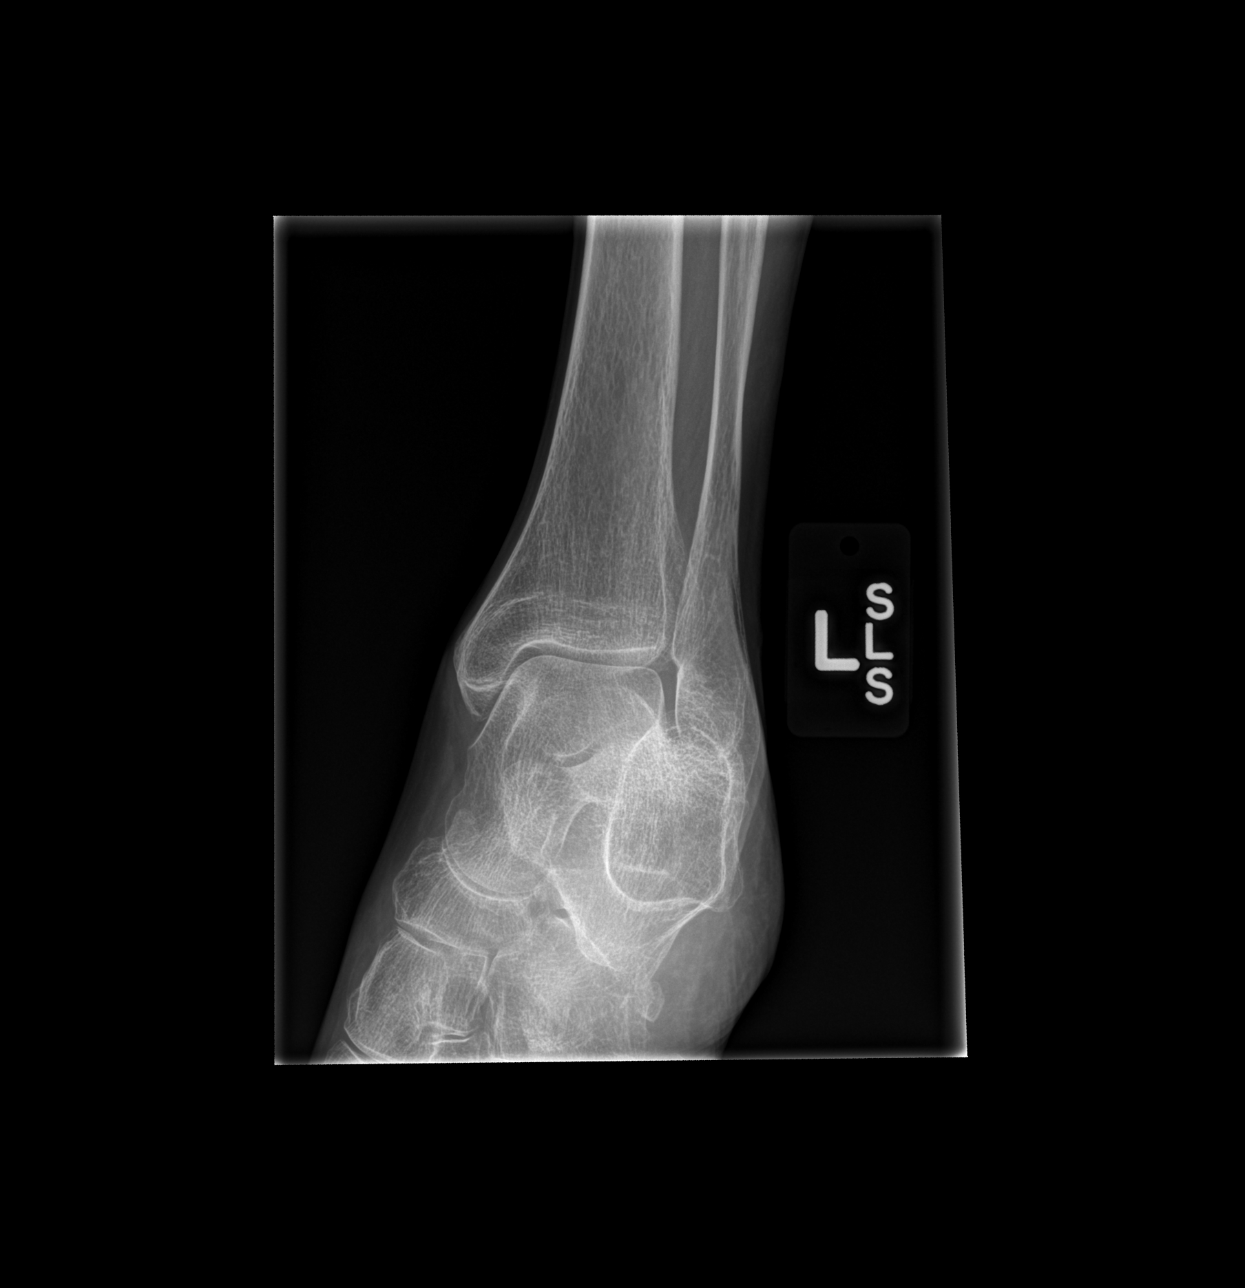

[x ankle lat left]
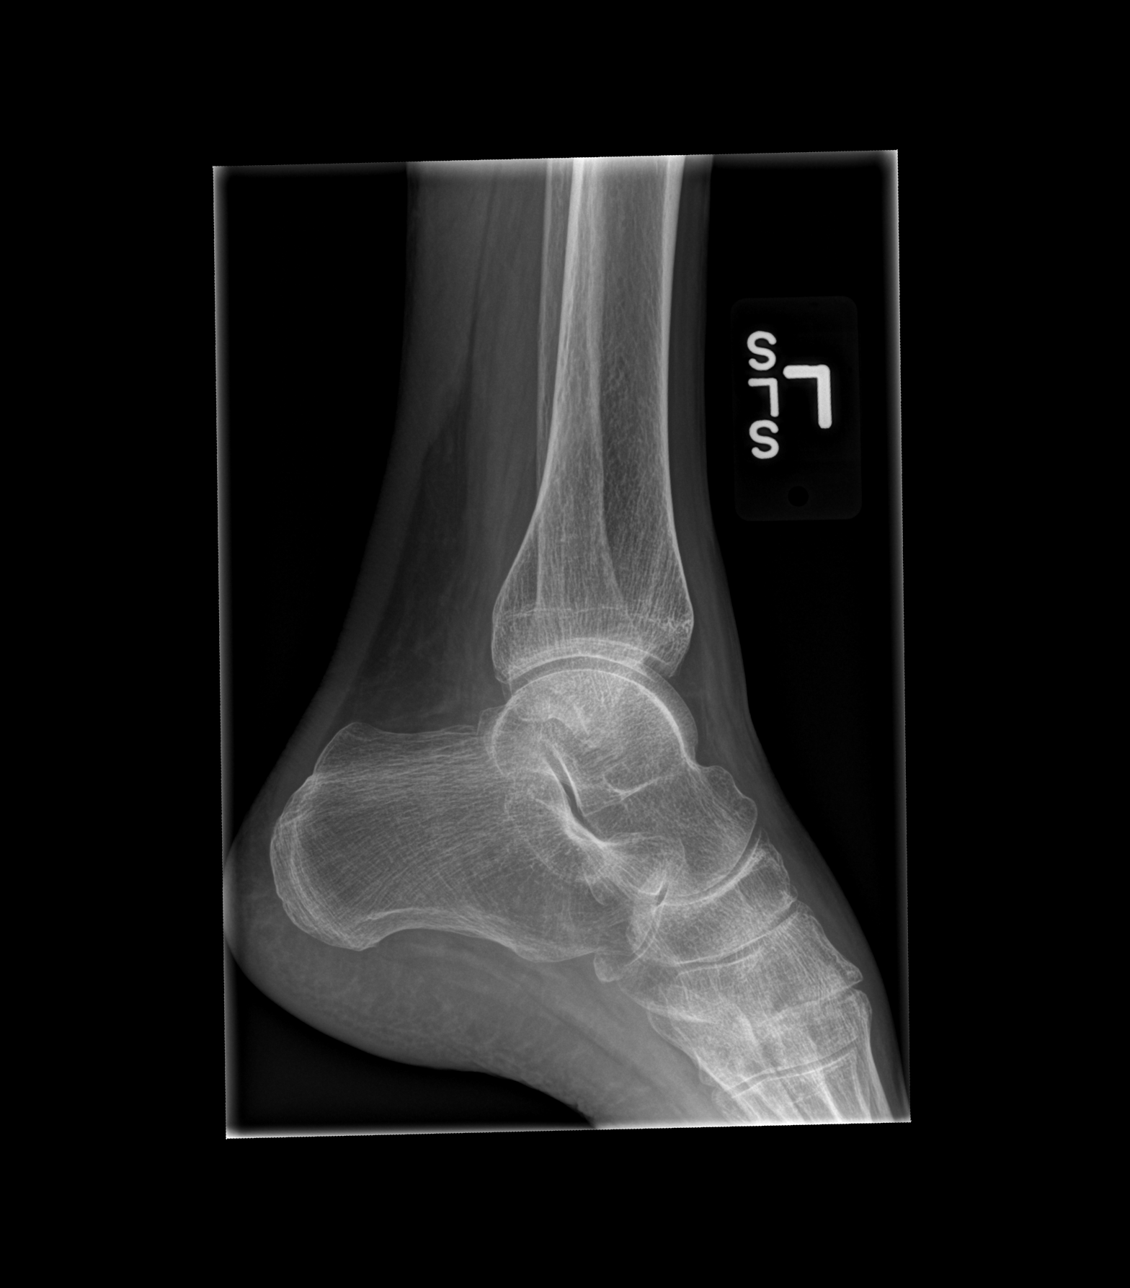

[3 of 3 positions shown; findings below may reference images not displayed]

FINDINGS: No evidence of acute fracture or dislocation.  Ankle
mortise intact with well-preserved joint space.  Osteopenia.  No
visible joint effusion.
IMPRESSION: Osteopenia.  No acute osseous abnormality.

## 2014-04-23 ENCOUNTER — Encounter: Payer: Self-pay | Admitting: Pulmonary Disease

## 2014-04-23 ENCOUNTER — Ambulatory Visit (INDEPENDENT_AMBULATORY_CARE_PROVIDER_SITE_OTHER): Payer: Medicaid Other | Admitting: Pulmonary Disease

## 2014-04-23 VITALS — BP 134/76 | HR 124 | Ht 64.0 in | Wt 83.2 lb

## 2014-04-23 DIAGNOSIS — J449 Chronic obstructive pulmonary disease, unspecified: Secondary | ICD-10-CM

## 2014-04-23 DIAGNOSIS — E43 Unspecified severe protein-calorie malnutrition: Secondary | ICD-10-CM | POA: Diagnosis not present

## 2014-04-23 DIAGNOSIS — K860 Alcohol-induced chronic pancreatitis: Secondary | ICD-10-CM

## 2014-04-23 DIAGNOSIS — Z720 Tobacco use: Secondary | ICD-10-CM

## 2014-04-23 NOTE — Progress Notes (Signed)
Subjective:    Patient ID: [name], female    DOB: 01/07/1950, 64 y.o.   MRN: [id number]  HPI Chief Complaint  Patient presents with  . Advice Only    self referral for sob.  Pt c/o sob with any exertion, prod cough with white mucus X8 months.  Pt was seeing Dr. Katherine Roan approx 10 years ago.    This is a 64 year old female who smokes 2 packs of cigarettes daily who comes to my clinic today for evaluation of shortness of breath. She says that this is been worse in the last 8 months but it has been a chronic problem for her for several years. She has been told in the past that she has COPD and she believes that she has had lung function testing but it is not immediately available to me today in clinic. She says that she has increasing shortness of breath over the last 8 months which is worse with minimal activity in the house. Just walking approximately 10 feet will make her gasp for breath. She has a cough which is productive of yellow to clear mucus production. She does not have environmental triggers such as hot, cold, smoke, dust, fumes which make her symptoms worse. She is capable of lying flat without difficulty. She does not have leg swelling or frank chest pain, however she does complain of chronic epigastric pain consistent with her pancreatitis. She says that this pain is a bit worse today. She uses albuterol on an as-needed basis and this does help with her shortness of breath.   Past Medical History  Diagnosis Date  . Pancreatitis   . COPD (chronic obstructive pulmonary disease)   . Substance abuse   . Cancer     renal ca  . Seizures   . Pancreatitis      Family History  Problem Relation Age of Onset  . CAD Mother   . Alcoholism Father   . COPD Father   . COPD Father      History   Social History  . Marital Status: Married    Spouse Name: N/A  . Number of Children: N/A  . Years of Education: N/A   Occupational History  . Not on file.   Social History  Main Topics  . Smoking status: Current Every Day Smoker -- 2.00 packs/day for 30 years    Types: Cigarettes  . Smokeless tobacco: Never Used  . Alcohol Use: 0.0 oz/week    0 Standard drinks or equivalent per week     Comment: Hx of Alcohol abuse  . Drug Use: Yes     Comment: Hx of polysubstance drug abuse  . Sexual Activity: No   Other Topics Concern  . Not on file   Social History Narrative     Allergies  Allergen Reactions  . Aspirin Other (See Comments)    bleeding  . Chlorpromazine Hcl Other (See Comments)    Muscle spasms  . Codeine Nausea Only     Outpatient Prescriptions Prior to Visit  Medication Sig Dispense Refill  . albuterol (PROVENTIL HFA;VENTOLIN HFA) 108 (90 BASE) MCG/ACT inhaler Inhale 2 puffs into the lungs every 6 (six) hours as needed for wheezing or shortness of breath. 1 Inhaler 1  . HYDROmorphone (DILAUDID) 4 MG tablet Take 1 tablet (4 mg total) by mouth every 6 (six) hours as needed for severe pain. 10 tablet 0  . lisinopril (PRINIVIL,ZESTRIL) 10 MG tablet Take 1 tablet (10 mg total) by mouth daily.  30 tablet 0  . LORazepam (ATIVAN) 1 MG tablet Take 1 tablet (1 mg total) by mouth every 6 (six) hours as needed. For anxiety. 10 tablet 0  . ondansetron (ZOFRAN) 8 MG tablet Take 8 mg by mouth every 8 (eight) hours as needed for nausea or vomiting.    . Multiple Vitamin (MULTIVITAMIN WITH MINERALS) TABS tablet Take 1 tablet by mouth daily. 30 tablet 2   No facility-administered medications prior to visit.       Review of Systems  Constitutional: Negative for fever and unexpected weight change.  HENT: Positive for congestion. Negative for dental problem, ear pain, nosebleeds, postnasal drip, rhinorrhea, sinus pressure, sneezing, sore throat and trouble swallowing.   Eyes: Negative for redness and itching.  Respiratory: Positive for cough and shortness of breath. Negative for chest tightness and wheezing.   Cardiovascular: Negative for palpitations and  leg swelling.  Gastrointestinal: Negative for nausea and vomiting.  Genitourinary: Negative for dysuria.  Musculoskeletal: Negative for joint swelling.  Skin: Negative for rash.  Neurological: Negative for headaches.  Hematological: Does not bruise/bleed easily.  Psychiatric/Behavioral: Negative for dysphoric mood. The patient is not nervous/anxious.        Objective:   Physical Exam  Filed Vitals:   04/23/14 1611  BP: 134/76  Pulse: 124  Height: '5\' 4"'$  (1.626 m)  Weight: 83 lb 3.2 oz (37.739 kg)  SpO2: 99%  RA  Ambulated 500 feet on room air and her O2 saturation remained at 100%  Gen: chronically ill appearing, complaining of pain HENT: NCAT, OP clear, neck supple without masses Eyes: PERRL, EOMi Lymph: no cervical lymphadenopathy PULM: Wheezing bilaterally, good air movement CV: RRR, no mgr, no JVD GI: BS+, soft, nontender, no hsm Derm: no rash or skin breakdown MSK: normal bulk and tone Neuro: A&Ox4, CN II-XII intact, strength 5/5 in all 4 extremities Psyche: normal mood and affect  Multiple recent hospitalization records were reviewed, these were all for either alcohol withdrawal or pancreatitis, no recent hospitalizations for respiratory illness February 2015 chest CT images reviewed there is no pulmonary embolism, there is very mild centrilobular emphysema in the periphery some parabronchial thickening noted      Assessment & Plan:   COPD, moderate She most likely has COPD causing her shortness of breath. She has wheezing on exam, heavy smoking history, and my review of the images from her February 2015 CT chest showed mild centrilobular emphysema, bronchial thickening and mild mucus plugging which is consistent with COPD. However, she has not had the Gold standard diagnostic test which is spirometry. She refused that test today citing an increase in pain from her chronic pancreatitis.  Plan: -CXR 2 view to evalute dyspnea -Counseled today at length to quit  smoking -Full pulmonary function test -Start Dulera 2 puffs twice a day -Obtain ambulatory oximetry -f/u in 3-4 weeks   Chronic alcoholic pancreatitis Complicated by polysubstance abuse. She complains of pain today but her abdominal exam and vitals signs are benign.  Plan: -f/u with PCP and pain specialist   Tobacco abuse Counseled at length to quit She qualifies for lung cancer screening, although I am not certain if we can have this pain for with her Medicaid. I will ask her lung cancer screening group to review her case.   Protein-calorie malnutrition, severe This is complicating her COPD and increasing her risk of death and exacerbation. Plan  Increase by mouth act take particularly protein as much as possible    Updated Medication List Outpatient Encounter  Prescriptions as of 04/23/2014  Medication Sig  . albuterol (PROVENTIL HFA;VENTOLIN HFA) 108 (90 BASE) MCG/ACT inhaler Inhale 2 puffs into the lungs every 6 (six) hours as needed for wheezing or shortness of breath.  Marland Kitchen HYDROmorphone (DILAUDID) 4 MG tablet Take 1 tablet (4 mg total) by mouth every 6 (six) hours as needed for severe pain.  Marland Kitchen lisinopril (PRINIVIL,ZESTRIL) 10 MG tablet Take 1 tablet (10 mg total) by mouth daily.  Marland Kitchen LORazepam (ATIVAN) 1 MG tablet Take 1 tablet (1 mg total) by mouth every 6 (six) hours as needed. For anxiety.  . ondansetron (ZOFRAN) 8 MG tablet Take 8 mg by mouth every 8 (eight) hours as needed for nausea or vomiting.  . [DISCONTINUED] Multiple Vitamin (MULTIVITAMIN WITH MINERALS) TABS tablet Take 1 tablet by mouth daily.

## 2014-04-23 NOTE — Assessment & Plan Note (Signed)
This is complicating her COPD and increasing her risk of death and exacerbation. Plan  Increase by mouth act take particularly protein as much as possible

## 2014-04-23 NOTE — Patient Instructions (Signed)
Keep using albuterol on an as-needed basis We will refer you to lung cancer screening We will obtain pulmonary function testing  quit smoking Use Dulera 2 puffs twice a day no matter how you feel Follow-up in 3-4 weeks

## 2014-04-23 NOTE — Assessment & Plan Note (Signed)
Complicated by polysubstance abuse. She complains of pain today but her abdominal exam and vitals signs are benign.  Plan: -f/u with PCP and pain specialist

## 2014-04-23 NOTE — Assessment & Plan Note (Signed)
Counseled at length to quit She qualifies for lung cancer screening, although I am not certain if we can have this pain for with her Medicaid. I will ask her lung cancer screening group to review her case.

## 2014-04-23 NOTE — Assessment & Plan Note (Addendum)
She most likely has COPD causing her shortness of breath. She has wheezing on exam, heavy smoking history, and my review of the images from her February 2015 CT chest showed mild centrilobular emphysema, bronchial thickening and mild mucus plugging which is consistent with COPD. However, she has not had the Gold standard diagnostic test which is spirometry. She refused that test today citing an increase in pain from her chronic pancreatitis.  Plan: -CXR 2 view to evalute dyspnea -Counseled today at length to quit smoking -Full pulmonary function test -Start Dulera 2 puffs twice a day -Obtain ambulatory oximetry -f/u in 3-4 weeks

## 2014-04-23 NOTE — Addendum Note (Signed)
Addended by: Len Blalock on: 04/23/2014 05:07 PM   Modules accepted: Orders

## 2014-05-12 IMAGING — XA IR FLUORO GUIDE CV LINE*L*
1 series · 1 of 1 positions shown · IV contrast (agent unspecified)
Comparison: none

CLINICAL DATA: History of renal cell carcinoma and need for CT with
contrast.  PICC line placement has been necessary in the past for
power injection of contrast material due to poor peripheral vein
stability.

[Series 300: line placements · 1 of 1 slices shown]
[im 1/1]
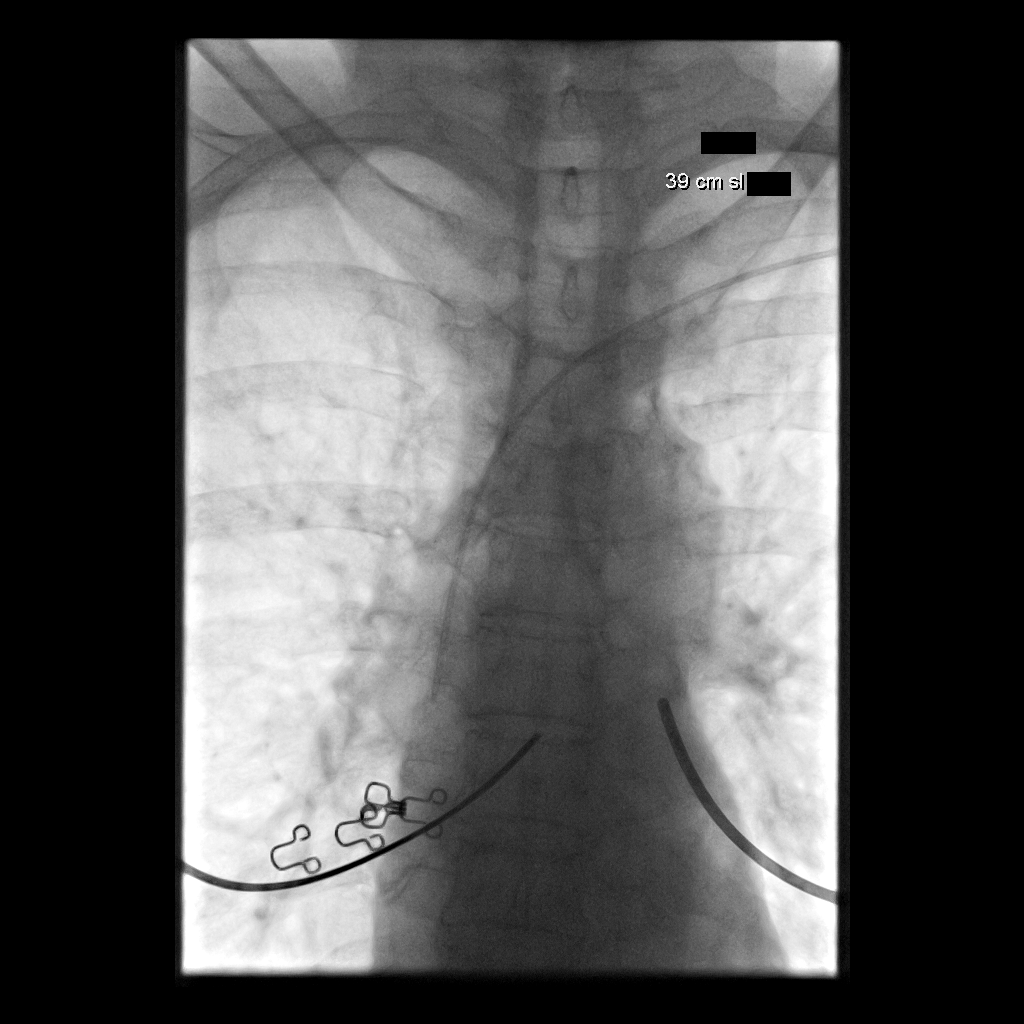

[1 of 1 positions shown; findings below may reference images not displayed]

PICC LINE PLACEMENT WITH ULTRASOUND AND FLUOROSCOPIC  GUIDANCE

Fluoroscopy Time: 0.1 minutes.

The left arm was prepped with chlorhexidine, draped in the usual
sterile fashion using maximum barrier technique (cap and mask,
sterile gown, sterile gloves, large sterile sheet, hand hygiene and
cutaneous antisepsis) and infiltrated locally with 1% Lidocaine.

Ultrasound demonstrated patency of the left basilic vein, and this
was documented with an image.  Under real-time ultrasound guidance,
this vein was accessed with a 21 gauge micropuncture needle and
image documentation was performed.  The needle was exchanged over a
guidewire for a peel-away sheath through which a 5 French single
lumen PICC trimmed to 39 cm was advanced, positioned with its tip
at the lower SVC/right atrial junction.  Fluoroscopy during the
procedure and fluoro spot radiograph confirms appropriate catheter
position.  The catheter was flushed, secured to the skin with
Prolene sutures, and covered with a sterile dressing.

Complications:  None
IMPRESSION: Successful left arm PICC line placement with ultrasound and
fluoroscopic guidance.  The catheter is ready for use.

## 2014-05-12 IMAGING — CT CT ABDOMEN WO/W CM
3 of 9 series · 13 of 46 positions shown, 19 images · IV contrast (OMNIPAQUE)
Comparison: 02/25/2011

CLINICAL DATA: Left renal cryoablation.  Mid abdominal/back pain,
nausea/vomiting/diarrhea, history of chronic pancreatitis.

CT ABDOMEN WITHOUT AND WITH CONTRAST
TECHNIQUE: Multidetector CT imaging of the abdomen was performed
following the standard protocol before and during bolus
administration of intravenous contrast.
Contrast: 100mL OMNIPAQUE IOHEXOL 300 MG/ML  SOLN

[Series 2: renal arterial · axial · arterial · 0.64mm/px · z∈[-283,-193]mm · 4 of 72 slices shown]
[im 11/72  soft-tissue]
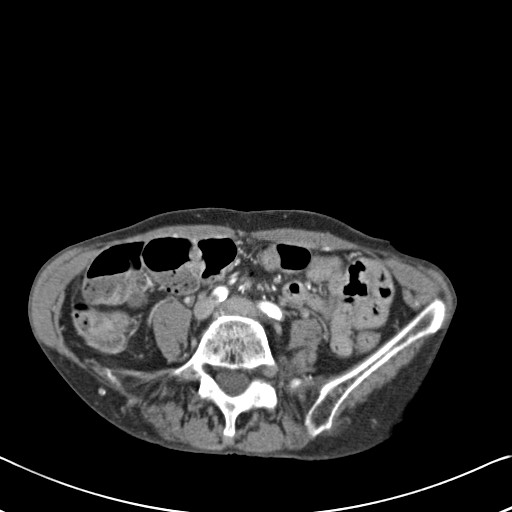
[im 21/72  soft-tissue]
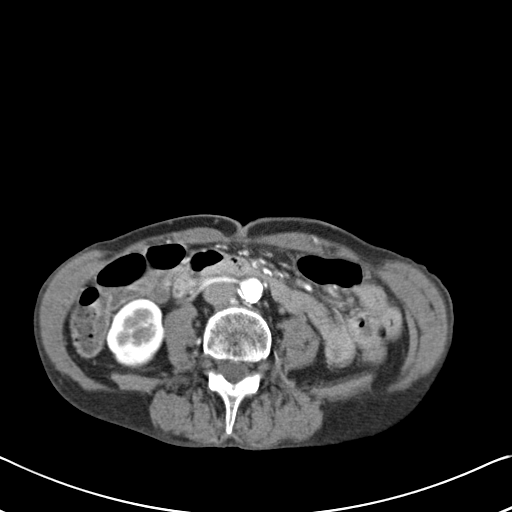
[im 31/72  soft-tissue]
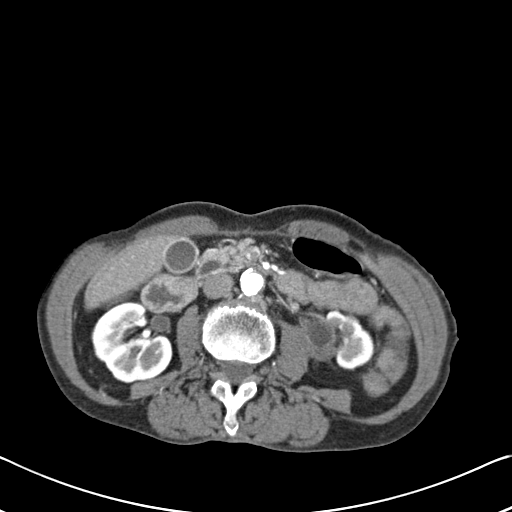
[im 41/72  soft-tissue]
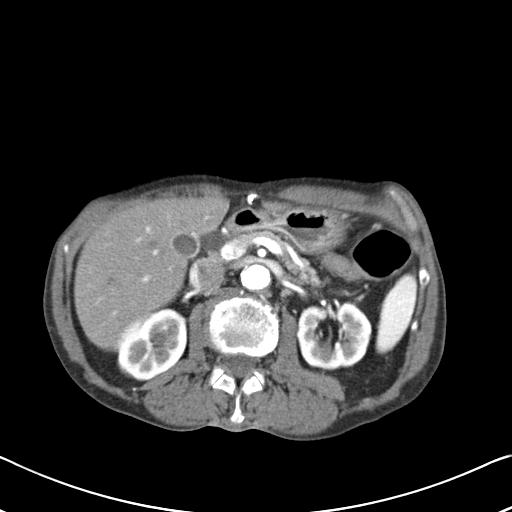

[Series 4: venous · axial · portal-venous · 0.64mm/px · z∈[-283,-133]mm · 6 of 72 slices shown, 11 images]
[im 11/72  soft-tissue]
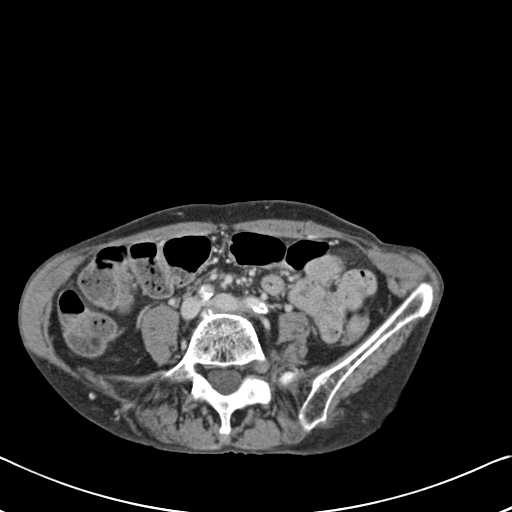
[im 11/72  bone]
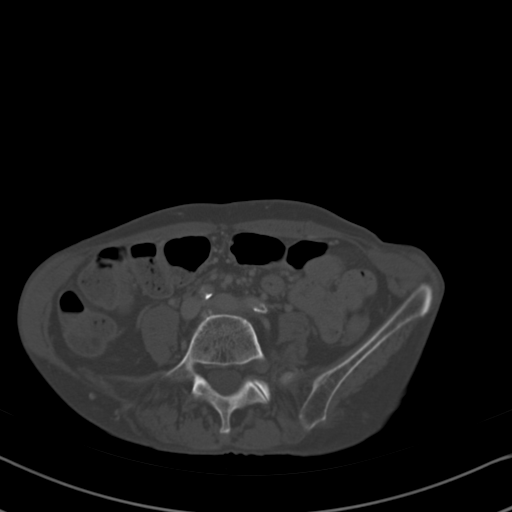
[im 21/72  soft-tissue]
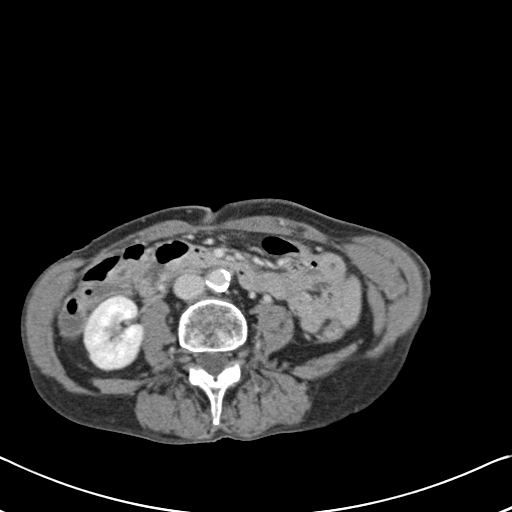
[im 31/72  soft-tissue]
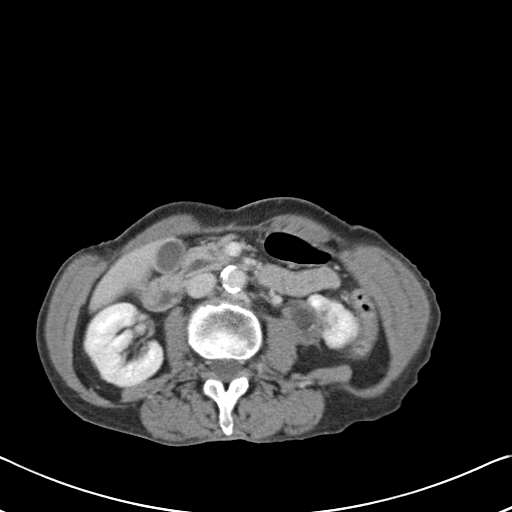
[im 31/72  lung]
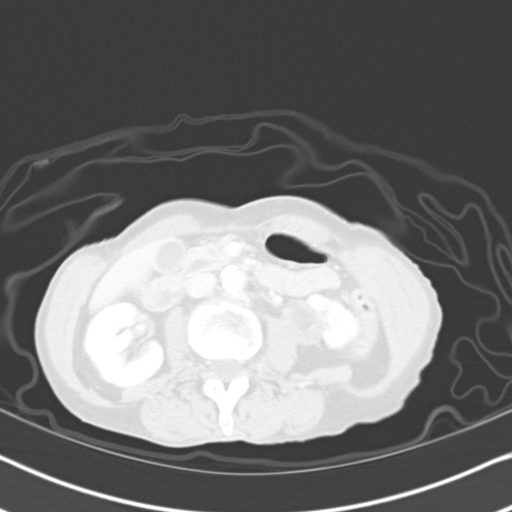
[im 41/72  soft-tissue]
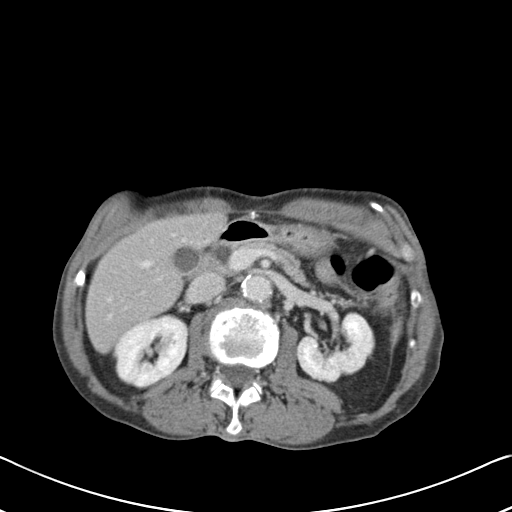
[im 41/72  lung]
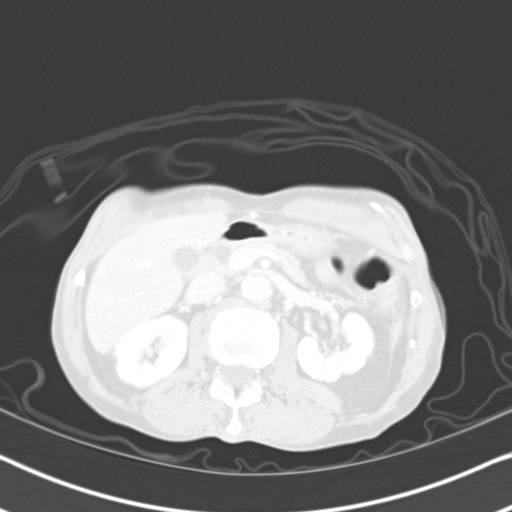
[im 51/72  soft-tissue]
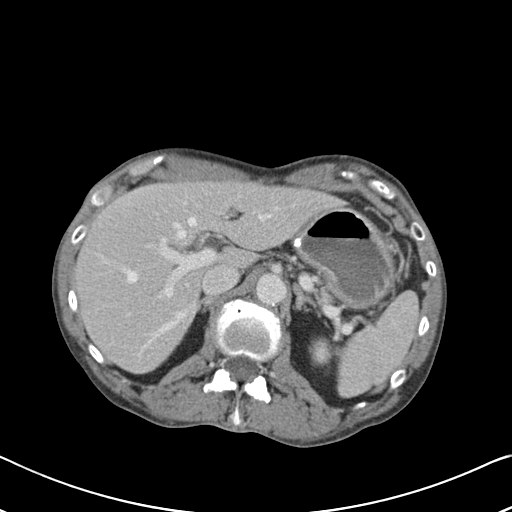
[im 51/72  lung]
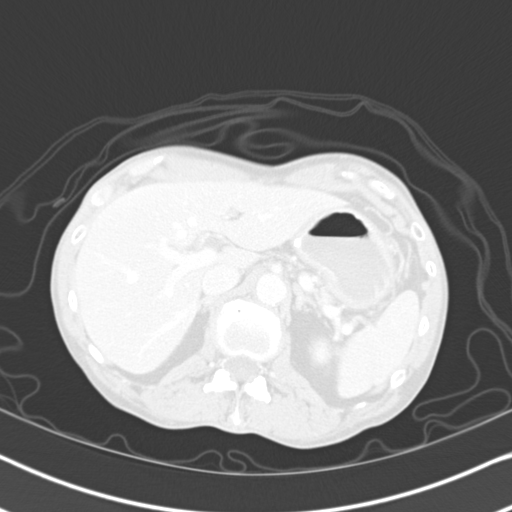
[im 61/72  soft-tissue]
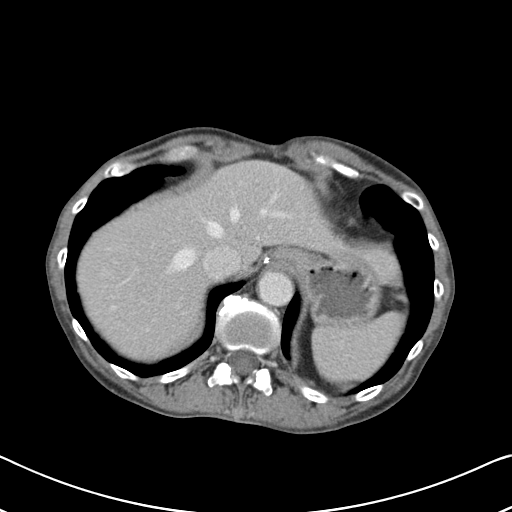
[im 61/72  lung]
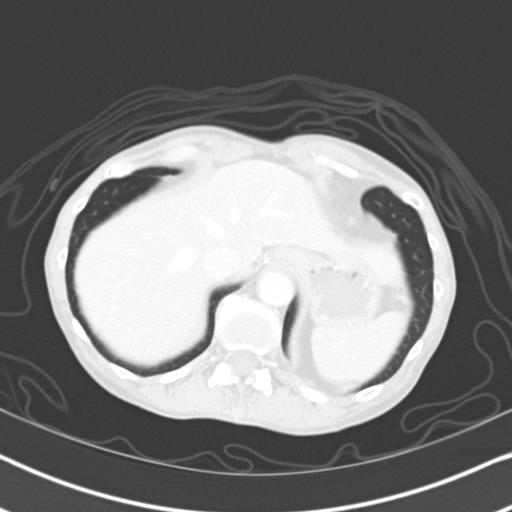

[Series 602: <mpr thick range> · coronal · 0.64mm/px · 3 of 109 slices shown, 4 images]
[im 22/109  soft-tissue]
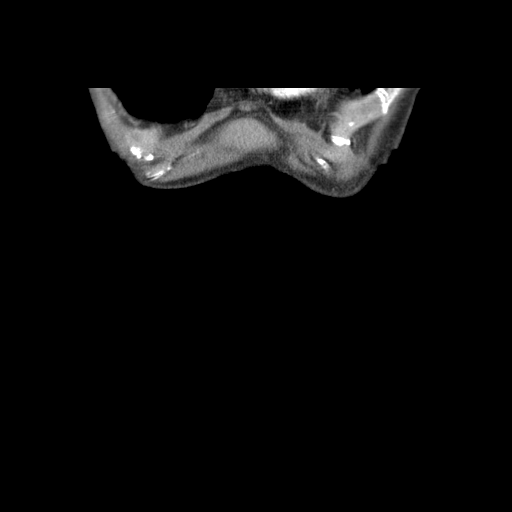
[im 44/109  soft-tissue]
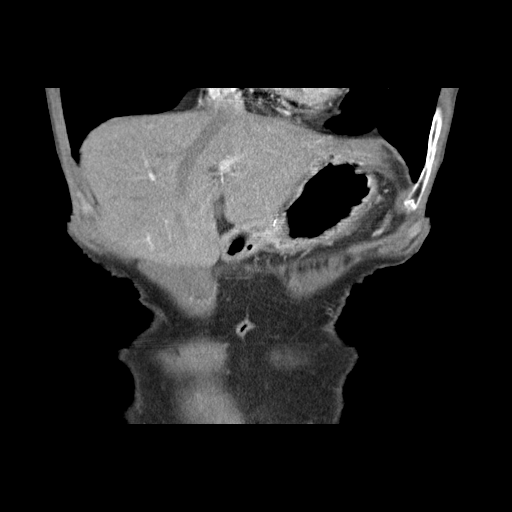
[im 44/109  bone]
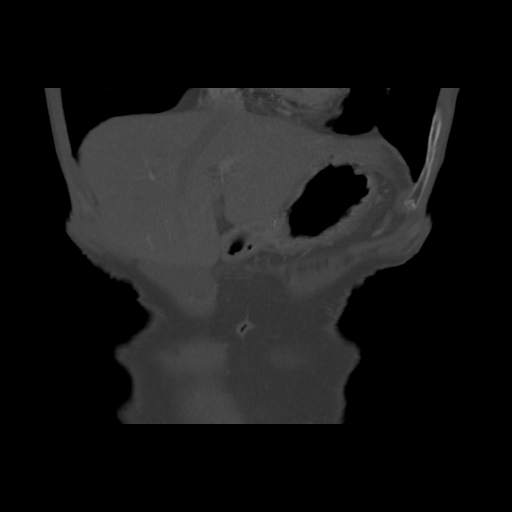
[im 65/109  soft-tissue]
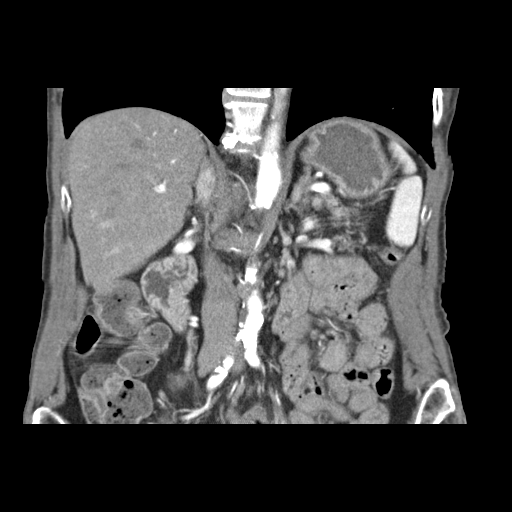

[13 of 46 positions shown; findings below may reference images not displayed]

FINDINGS: Motion degraded images.

Lung bases are essentially clear.

Liver, spleen, and adrenal glands are within normal limits.

No peripancreatic inflammatory changes to suggest acute
pancreatitis.  No associated dilatation of the main pancreatic
duct.

Gallbladder is unremarkable.  Common duct is mildly dilated,
measuring 7 mm, unchanged.

Status post cryoablation of the medial left upper kidney.
Hypodense cryoablation zone measures 2.3 x 1.8 cm, unchanged.
Peripheral calcifications and mild soft tissue rim posteriorly
(series 2/image 41), unchanged.  No enhancement.

Right kidney is within normal limits.  No hydronephrosis.

Atherosclerotic calcifications of the abdominal aorta and branch
vessels.

No abdominal ascites.

Small retroperitoneal lymph nodes which do not meet pathologic CT
size criteria.

Healing left L2 transverse process fracture (series 4/image 26),
new from the prior CT.  Degenerative changes of the visualized
thoracolumbar spine.
IMPRESSION: Stable cryoablation zone in the medial left upper kidney.  No
enhancement to suggest viable tumor.

Healing left L2 transverse process fracture.

## 2014-05-17 ENCOUNTER — Emergency Department (HOSPITAL_COMMUNITY)
Admission: EM | Admit: 2014-05-17 | Discharge: 2014-05-17 | Disposition: A | Discharge status: Home / Self Care | Payer: Medicaid Other | Attending: Emergency Medicine | Admitting: Emergency Medicine

## 2014-05-17 ENCOUNTER — Encounter (HOSPITAL_COMMUNITY): Payer: Self-pay | Admitting: *Deleted

## 2014-05-17 DIAGNOSIS — J449 Chronic obstructive pulmonary disease, unspecified: Secondary | ICD-10-CM | POA: Diagnosis not present

## 2014-05-17 DIAGNOSIS — Z720 Tobacco use: Secondary | ICD-10-CM | POA: Insufficient documentation

## 2014-05-17 DIAGNOSIS — R1012 Left upper quadrant pain: Secondary | ICD-10-CM | POA: Insufficient documentation

## 2014-05-17 DIAGNOSIS — Z85528 Personal history of other malignant neoplasm of kidney: Secondary | ICD-10-CM | POA: Diagnosis not present

## 2014-05-17 DIAGNOSIS — R112 Nausea with vomiting, unspecified: Secondary | ICD-10-CM | POA: Insufficient documentation

## 2014-05-17 DIAGNOSIS — Z79899 Other long term (current) drug therapy: Secondary | ICD-10-CM | POA: Diagnosis not present

## 2014-05-17 DIAGNOSIS — R1013 Epigastric pain: Secondary | ICD-10-CM | POA: Diagnosis not present

## 2014-05-17 DIAGNOSIS — Z9889 Other specified postprocedural states: Secondary | ICD-10-CM | POA: Diagnosis not present

## 2014-05-17 DIAGNOSIS — Z9049 Acquired absence of other specified parts of digestive tract: Secondary | ICD-10-CM | POA: Diagnosis not present

## 2014-05-17 DIAGNOSIS — R109 Unspecified abdominal pain: Secondary | ICD-10-CM | POA: Diagnosis present

## 2014-05-17 DIAGNOSIS — Z8719 Personal history of other diseases of the digestive system: Secondary | ICD-10-CM | POA: Diagnosis not present

## 2014-05-17 LAB — COMPREHENSIVE METABOLIC PANEL
ALBUMIN: 4.1 g/dL (ref 3.5–5.0)
ALT: 27 U/L (ref 14–54)
AST: 35 U/L (ref 15–41)
Alkaline Phosphatase: 79 U/L (ref 38–126)
Anion gap: 7 (ref 5–15)
BILIRUBIN TOTAL: 0.6 mg/dL (ref 0.3–1.2)
BUN: 12 mg/dL (ref 6–20)
CO2: 21 mmol/L — AB (ref 22–32)
Calcium: 9.5 mg/dL (ref 8.9–10.3)
Chloride: 99 mmol/L — ABNORMAL LOW (ref 101–111)
Creatinine, Ser: 0.89 mg/dL (ref 0.44–1.00)
GFR calc non Af Amer: >60 mL/min (ref >60)
Glucose, Bld: 96 mg/dL (ref 65–99)
Potassium: 5.2 mmol/L — ABNORMAL HIGH (ref 3.5–5.1)
SODIUM: 127 mmol/L — AB (ref 135–145)
Total Protein: 7.6 g/dL (ref 6.5–8.1)

## 2014-05-17 LAB — CBC WITH DIFFERENTIAL/PLATELET
BASOS ABS: 0 10*3/uL (ref 0.0–0.1)
Basophils Relative: 1 % (ref 0–1)
EOS ABS: 0 10*3/uL (ref 0.0–0.7)
Eosinophils Relative: 1 % (ref 0–5)
HEMATOCRIT: 39.3 % (ref 36.0–46.0)
HEMOGLOBIN: 13.7 g/dL (ref 12.0–15.0)
LYMPHS PCT: 18 % (ref 12–46)
Lymphs Abs: 1 10*3/uL (ref 0.7–4.0)
MCH: 31.1 pg (ref 26.0–34.0)
MCHC: 34.9 g/dL (ref 30.0–36.0)
MCV: 89.3 fL (ref 78.0–100.0)
MONO ABS: 0.5 10*3/uL (ref 0.1–1.0)
Monocytes Relative: 8 % (ref 3–12)
NEUTROS PCT: 72 % (ref 43–77)
Neutro Abs: 4.3 10*3/uL (ref 1.7–7.7)
Platelets: 136 10*3/uL — ABNORMAL LOW (ref 150–400)
RBC: 4.4 MIL/uL (ref 3.87–5.11)
RDW: 12.4 % (ref 11.5–15.5)
WBC: 5.9 10*3/uL (ref 4.0–10.5)

## 2014-05-17 LAB — LIPASE, BLOOD: LIPASE: 37 U/L (ref 22–51)

## 2014-05-17 LAB — URINALYSIS, ROUTINE W REFLEX MICROSCOPIC
BILIRUBIN URINE: NEGATIVE
Glucose, UA: NEGATIVE mg/dL
Ketones, ur: NEGATIVE mg/dL
Leukocytes, UA: NEGATIVE
Nitrite: NEGATIVE
PH: 6 (ref 5.0–8.0)
Protein, ur: NEGATIVE mg/dL
Specific Gravity, Urine: 1.009 (ref 1.005–1.030)
UROBILINOGEN UA: 0.2 mg/dL (ref 0.0–1.0)

## 2014-05-17 LAB — URINE MICROSCOPIC-ADD ON

## 2014-05-17 MED ORDER — SODIUM CHLORIDE 0.9 % IV BOLUS (SEPSIS): 1000.0000 mL | Freq: Once | INTRAVENOUS | Status: DC

## 2014-05-17 MED ORDER — HYDROMORPHONE HCL 1 MG/ML IJ SOLN: 1.0000 mg | Freq: Once | INTRAMUSCULAR | Status: DC

## 2014-05-17 MED ORDER — HYDROMORPHONE HCL 1 MG/ML IJ SOLN
1.0000 mg | Freq: Once | INTRAMUSCULAR | Status: AC
  Administered 2014-05-17: 1 mg via INTRAMUSCULAR
  Filled 2014-05-17: qty 1

## 2014-05-17 MED ORDER — ONDANSETRON HCL 4 MG/2ML IJ SOLN: 4.0000 mg | Freq: Once | INTRAMUSCULAR | Status: DC

## 2014-05-17 MED ORDER — METOCLOPRAMIDE HCL 5 MG/ML IJ SOLN
10.0000 mg | Freq: Once | INTRAMUSCULAR | Status: AC
  Administered 2014-05-17: 10 mg via INTRAMUSCULAR
  Filled 2014-05-17: qty 2

## 2014-05-17 MED ORDER — METOCLOPRAMIDE HCL 10 MG PO TABS: 10.0000 mg | ORAL_TABLET | Freq: Four times a day (QID) | ORAL | Status: DC

## 2014-05-17 NOTE — Discharge Instructions (Signed)
Discontinue drinking alcohol. Hydrate with plenty of water or other nonalcoholic fluids. Take Reglan as needed for nausea. Follow up with your primary care doctor.  Acute Pancreatitis Acute pancreatitis is a disease in which the pancreas becomes suddenly inflamed. The pancreas is a large gland located behind your stomach. The pancreas produces enzymes that help digest food. The pancreas also releases the hormones glucagon and insulin that help regulate blood sugar. Damage to the pancreas occurs when the digestive enzymes from the pancreas are activated and begin attacking the pancreas before being released into the intestine. Most acute attacks last a couple of days and can cause serious complications. Some people become dehydrated and develop low blood pressure. In severe cases, bleeding into the pancreas can lead to shock and can be life-threatening. The lungs, heart, and kidneys may fail. CAUSES  Pancreatitis can happen to anyone. In some cases, the cause is unknown. Most cases are caused by:  Alcohol abuse.  Gallstones. Other less common causes are:  Certain medicines.  Exposure to certain chemicals.  Infection.  Damage caused by an accident (trauma).  Abdominal surgery. SYMPTOMS   Pain in the upper abdomen that may radiate to the back.  Tenderness and swelling of the abdomen.  Nausea and vomiting. DIAGNOSIS  Your caregiver will perform a physical exam. Blood and stool tests may be done to confirm the diagnosis. Imaging tests may also be done, such as X-rays, CT scans, or an ultrasound of the abdomen. TREATMENT  Treatment usually requires a stay in the hospital. Treatment may include:  Pain medicine.  Fluid replacement through an intravenous line (IV).  Placing a tube in the stomach to remove stomach contents and control vomiting.  Not eating for 3 or 4 days. This gives your pancreas a rest, because enzymes are not being produced that can cause further damage.  Antibiotic  medicines if your condition is caused by an infection.  Surgery of the pancreas or gallbladder. HOME CARE INSTRUCTIONS   Follow the diet advised by your caregiver. This may involve avoiding alcohol and decreasing the amount of fat in your diet.  Eat smaller, more frequent meals. This reduces the amount of digestive juices the pancreas produces.  Drink enough fluids to keep your urine clear or pale yellow.  Only take over-the-counter or prescription medicines as directed by your caregiver.  Avoid drinking alcohol if it caused your condition.  Do not smoke.  Get plenty of rest.  Check your blood sugar at home as directed by your caregiver.  Keep all follow-up appointments as directed by your caregiver. SEEK MEDICAL CARE IF:   You do not recover as quickly as expected.  You develop new or worsening symptoms.  You have persistent pain, weakness, or nausea.  You recover and then have another episode of pain. SEEK IMMEDIATE MEDICAL CARE IF:   You are unable to eat or keep fluids down.  Your pain becomes severe.  You have a fever or persistent symptoms for more than 2 to 3 days.  You have a fever and your symptoms suddenly get worse.  Your skin or the white part of your eyes turn yellow (jaundice).  You develop vomiting.  You feel dizzy, or you faint.  Your blood sugar is high (over 300 mg/dL). MAKE SURE YOU:   Understand these instructions.  Will watch your condition.  Will get help right away if you are not doing well or get worse. Document Released: 12/21/2004 Document Revised: 06/22/2011 Document Reviewed: 04/01/2011 [employer] Patient Information 2015  [employer]. This information is not intended to replace advice given to you by your health care provider. Make sure you discuss any questions you have with your health care provider.  Low-Fat Diet for Pancreatitis or Gallbladder Conditions A low-fat diet can be helpful if you have pancreatitis or a gallbladder  condition. With these conditions, your pancreas and gallbladder have trouble digesting fats. A healthy eating plan with less fat will help rest your pancreas and gallbladder and reduce your symptoms. WHAT DO I NEED TO KNOW ABOUT THIS DIET?  Eat a low-fat diet.  Reduce your fat intake to less than 20-30% of your total daily calories. This is less than 50-60 g of fat per day.  Remember that you need some fat in your diet. Ask your dietician what your daily goal should be.  Choose nonfat and low-fat healthy foods. Look for the words "nonfat," "low fat," or "fat free."  As a guide, look on the label and choose foods with less than 3 g of fat per serving. Eat only one serving.  Avoid alcohol.  Do not smoke. If you need help quitting, talk with your health care provider.  Eat small frequent meals instead of three large heavy meals. WHAT FOODS CAN I EAT? Grains Include healthy grains and starches such as potatoes, wheat bread, fiber-rich cereal, and brown rice. Choose whole grain options whenever possible. In adults, whole grains should account for 45-65% of your daily calories.  Fruits and Vegetables Eat plenty of fruits and vegetables. Fresh fruits and vegetables add fiber to your diet. Meats and Other Protein Sources Eat lean meat such as chicken and pork. Trim any fat off of meat before cooking it. Eggs, fish, and beans are other sources of protein. In adults, these foods should account for 10-35% of your daily calories. Dairy Choose low-fat milk and dairy options. Dairy includes fat and protein, as well as calcium.  Fats and Oils Limit high-fat foods such as fried foods, sweets, baked goods, sugary drinks.  Other Creamy sauces and condiments, such as mayonnaise, can add extra fat. Think about whether or not you need to use them, or use smaller amounts or low fat options. WHAT FOODS ARE NOT RECOMMENDED?  High fat foods, such as:  [employer].  Ice cream.  [place] toast.  Sweet  rolls.  Pizza.  Cheese bread.  Foods covered with batter, butter, creamy sauces, or cheese.  Fried foods.  Sugary drinks and desserts.  Foods that cause gas or bloating Document Released: 12/26/2012 Document Reviewed: 12/26/2012 [facility] Patient Information 2015 [place], [place]. This information is not intended to replace advice given to you by your health care provider. Make sure you discuss any questions you have with your health care provider.

## 2014-05-17 NOTE — ED Provider Notes (Signed)
CSN: [id number]     Arrival date & time 05/17/14  0135 History   First MD Initiated Contact with Patient 05/17/14 0246     Chief Complaint  Patient presents with  . Abdominal Pain    (Consider location/radiation/quality/duration/timing/severity/associated sxs/prior Treatment) HPI Comments: Patient is a 64 year old female with a history of pancreatitis, COPD, substance abuse, and seizures. She presents to the emergency department today for further evaluation of abdominal pain. Abdominal pain began at 1000 yesterday. She states that onset of symptoms was after she "slipped up" and drank one beer. She reports a history of chronic pancreatitis and states that symptoms today feel consistent with this. She describes the pain as stabbing, "like a knife", which has been worsening since onset. Patient has had nausea as well as 2 episodes of emesis. She denies fever, distension, melena, hematochezia, hematuria, and hematemesis. Abdominal surgical hx significant for partial gastrectomy and kidney surgery.  PCP - Dr. Alyson Ingles  Patient is a 64 y.o. female presenting with abdominal pain. The history is provided by the patient. No language interpreter was used.  Abdominal Pain Associated symptoms: nausea and vomiting   Associated symptoms: no diarrhea, no fever, no hematuria and no shortness of breath     Past Medical History  Diagnosis Date  . Pancreatitis   . COPD (chronic obstructive pulmonary disease)   . Substance abuse   . Cancer     renal ca  . Seizures   . Pancreatitis    Past Surgical History  Procedure Laterality Date  . Partial gastrectomy    . Kidney surgery      removed cancerous lesions   Family History  Problem Relation Age of Onset  . CAD Mother   . Alcoholism Father   . COPD Father   . COPD Father    History  Substance Use Topics  . Smoking status: Current Every Day Smoker -- 1.00 packs/day for 30 years    Types: Cigarettes  . Smokeless tobacco: Never Used  . Alcohol  Use: 0.0 oz/week    0 Standard drinks or equivalent per week     Comment: occ   OB History    No data available      Review of Systems  Constitutional: Negative for fever.  Respiratory: Negative for shortness of breath.   Gastrointestinal: Positive for nausea, vomiting and abdominal pain. Negative for diarrhea and blood in stool.  Genitourinary: Negative for hematuria.  Neurological: Negative for syncope.  All other systems reviewed and are negative.   Allergies  Aspirin; Chlorpromazine hcl; and Codeine  Home Medications   Prior to Admission medications   Medication Sig Start Date End Date Taking? Authorizing Provider  albuterol (PROVENTIL HFA;VENTOLIN HFA) 108 (90 BASE) MCG/ACT inhaler Inhale 2 puffs into the lungs every 6 (six) hours as needed for wheezing or shortness of breath. 09/23/13  Yes Sherwood Gambler, MD  HYDROmorphone (DILAUDID) 4 MG tablet Take 1 tablet (4 mg total) by mouth every 6 (six) hours as needed for severe pain. 09/23/13  Yes Sherwood Gambler, MD  lisinopril (PRINIVIL,ZESTRIL) 10 MG tablet Take 1 tablet (10 mg total) by mouth daily. 06/06/13  Yes Costin Karlyne Greenspan, MD  LORazepam (ATIVAN) 1 MG tablet Take 1 tablet (1 mg total) by mouth every 6 (six) hours as needed. For anxiety. 04/01/13  Yes Erline Hau, MD  ondansetron (ZOFRAN) 4 MG tablet Take 4 mg by mouth 3 (three) times daily as needed. 05/09/14  Yes Historical Provider, MD  metoCLOPramide (REGLAN) 10  MG tablet Take 1 tablet (10 mg total) by mouth every 6 (six) hours. 05/17/14   Antonietta Breach, PA-C   BP 133/88 mmHg  Pulse 96  Temp(Src) 98 F (36.7 C) (Oral)  Resp 15  SpO2 97%   Physical Exam  Constitutional: She is oriented to person, place, and time. She appears well-developed and well-nourished. No distress.  Nontoxic/nonseptic appearing.  HENT:  Head: Normocephalic and atraumatic.  Eyes: Conjunctivae and EOM are normal. No scleral icterus.  Neck: Normal range of motion.  Cardiovascular:  Regular rhythm and intact distal pulses.   Mild tachycardia; 101-105  Pulmonary/Chest: Effort normal. No respiratory distress. She has no wheezes. She has no rales.  Respirations even and unlabored. Lungs without wheezing or rales.  Abdominal: Soft. She exhibits no distension. There is tenderness. There is no rebound and no guarding.  Soft abdomen with tenderness to palpation in the epigastric region and mildly in the left upper quadrant. No peritoneal signs or involuntary guarding.  Musculoskeletal: Normal range of motion.  Neurological: She is alert and oriented to person, place, and time. She exhibits normal muscle tone. Coordination normal.  Skin: Skin is warm and dry. No rash noted. She is not diaphoretic. No erythema. No pallor.  Psychiatric: She has a normal mood and affect. Her behavior is normal.  Nursing note and vitals reviewed.   ED Course  Procedures (including critical care time) Labs Review Labs Reviewed  COMPREHENSIVE METABOLIC PANEL - Abnormal; Notable for the following:    Sodium 127 (*)    Potassium 5.2 (*)    Chloride 99 (*)    CO2 21 (*)    All other components within normal limits  URINALYSIS, ROUTINE W REFLEX MICROSCOPIC - Abnormal; Notable for the following:    Hgb urine dipstick MODERATE (*)    All other components within normal limits  CBC WITH DIFFERENTIAL/PLATELET - Abnormal; Notable for the following:    Platelets 136 (*)    All other components within normal limits  LIPASE, BLOOD  URINE MICROSCOPIC-ADD ON  CBC WITH DIFFERENTIAL/PLATELET    Imaging Review No results found.   EKG Interpretation None      MDM   Final diagnoses:  Epigastric pain    64 year old female presents to the emergency department for further evaluation of abdominal pain which began after drinking one beer. Patient reports a history of chronic pancreatitis. She is afebrile and hemodynamically stable over ED course. Hyponatremia likely secondary to alcohol use. She has no  leukocytosis or anemia today. Liver and kidney function preserved. Lipase is normal.  Patient has tenderness in her epigastric region on deep palpation. This remains stable on reexamination. Doubt emergent intra-abdominal etiologies such as infection, ruptured viscus, SBO or pSBO. Patient given Dilaudid in ED for pain control. She has been able to tolerate fluids orally without emesis. Have advised her to follow-up with her primary care doctor as an outpatient for further management. Will not discharge with pain medication as she is due to receive a prescription for 120 tablets of 4 mg Dilaudid. Return precautions discussed and provided. Patient agreeable to plan with no unaddressed concerns. Patient discharged in good condition.   Filed Vitals:   05/17/14 0144 05/17/14 0439  BP: 143/108 133/88  Pulse: 115 96  Temp: 98 F (36.7 C) 98 F (36.7 C)  TempSrc: Oral Oral  Resp: 22 15  SpO2: 100% 97%      Antonietta Breach, PA-C 05/17/14 0300  April Palumbo, MD 05/17/14 [phone]

## 2014-05-17 NOTE — ED Notes (Signed)
Pt informed of need for urine sample. Pt states "im in too much pain to give a urine sample". Will continue to monitor

## 2014-05-17 NOTE — ED Notes (Signed)
PT states that she began to have abd pain around 10am yesterday; pt c/o nausea and vomit x 2; pt states that she has also had breathing problems for 8 months after being diagnosed with COPD but states that it hasn't gotten any better

## 2014-06-04 ENCOUNTER — Ambulatory Visit: Payer: Medicaid Other | Admitting: Adult Health

## 2014-06-04 LAB — PULMONARY FUNCTION TEST
FEF 25-75 PRE: 0.26 L/s
FEF2575-%Pred-Pre: 11 %
FEV1-%Pred-Pre: 23 %
FEV1-Pre: 0.62 L
FEV1FVC-%Pred-Pre: 55 %
FEV6-%Pred-Pre: 42 %
FEV6-Pre: 1.4 L
FEV6FVC-%PRED-PRE: 100 %
FVC-%Pred-Pre: 42 %
FVC-Pre: 1.45 L
PRE FEV1/FVC RATIO: 43 %
Pre FEV6/FVC Ratio: 97 %

## 2014-06-18 ENCOUNTER — Ambulatory Visit: Payer: Medicaid Other | Admitting: Pulmonary Disease

## 2014-07-03 ENCOUNTER — Encounter (HOSPITAL_COMMUNITY): Payer: Self-pay | Admitting: Emergency Medicine

## 2014-07-03 ENCOUNTER — Inpatient Hospital Stay (HOSPITAL_COMMUNITY)
Admission: EM | Admit: 2014-07-03 | Discharge: 2014-07-05 | DRG: 438 | Disposition: A | Discharge status: Home / Self Care | Payer: Medicaid Other | Attending: Internal Medicine | Admitting: Internal Medicine

## 2014-07-03 ENCOUNTER — Emergency Department (HOSPITAL_COMMUNITY): Payer: Medicaid Other

## 2014-07-03 DIAGNOSIS — F419 Anxiety disorder, unspecified: Secondary | ICD-10-CM | POA: Diagnosis present

## 2014-07-03 DIAGNOSIS — F411 Generalized anxiety disorder: Secondary | ICD-10-CM | POA: Diagnosis not present

## 2014-07-03 DIAGNOSIS — F17210 Nicotine dependence, cigarettes, uncomplicated: Secondary | ICD-10-CM | POA: Diagnosis present

## 2014-07-03 DIAGNOSIS — Z886 Allergy status to analgesic agent status: Secondary | ICD-10-CM | POA: Diagnosis not present

## 2014-07-03 DIAGNOSIS — E43 Unspecified severe protein-calorie malnutrition: Secondary | ICD-10-CM | POA: Diagnosis present

## 2014-07-03 DIAGNOSIS — J449 Chronic obstructive pulmonary disease, unspecified: Secondary | ICD-10-CM | POA: Diagnosis present

## 2014-07-03 DIAGNOSIS — K852 Alcohol induced acute pancreatitis without necrosis or infection: Secondary | ICD-10-CM | POA: Diagnosis present

## 2014-07-03 DIAGNOSIS — Z681 Body mass index (BMI) 19 or less, adult: Secondary | ICD-10-CM | POA: Diagnosis not present

## 2014-07-03 DIAGNOSIS — Z720 Tobacco use: Secondary | ICD-10-CM | POA: Diagnosis present

## 2014-07-03 DIAGNOSIS — R112 Nausea with vomiting, unspecified: Secondary | ICD-10-CM

## 2014-07-03 DIAGNOSIS — R100 Acute abdomen: Secondary | ICD-10-CM | POA: Diagnosis not present

## 2014-07-03 DIAGNOSIS — I10 Essential (primary) hypertension: Secondary | ICD-10-CM | POA: Diagnosis present

## 2014-07-03 DIAGNOSIS — D649 Anemia, unspecified: Secondary | ICD-10-CM | POA: Diagnosis present

## 2014-07-03 DIAGNOSIS — G8929 Other chronic pain: Secondary | ICD-10-CM | POA: Diagnosis present

## 2014-07-03 DIAGNOSIS — Z885 Allergy status to narcotic agent status: Secondary | ICD-10-CM | POA: Diagnosis not present

## 2014-07-03 DIAGNOSIS — R109 Unspecified abdominal pain: Secondary | ICD-10-CM

## 2014-07-03 DIAGNOSIS — Z903 Acquired absence of stomach [part of]: Secondary | ICD-10-CM | POA: Diagnosis present

## 2014-07-03 DIAGNOSIS — F1010 Alcohol abuse, uncomplicated: Secondary | ICD-10-CM | POA: Diagnosis present

## 2014-07-03 DIAGNOSIS — K859 Acute pancreatitis, unspecified: Principal | ICD-10-CM | POA: Diagnosis present

## 2014-07-03 LAB — CBC
HCT: 33.7 % — ABNORMAL LOW (ref 36.0–46.0)
HEMOGLOBIN: 11.6 g/dL — AB (ref 12.0–15.0)
MCH: 32.1 pg (ref 26.0–34.0)
MCHC: 34.4 g/dL (ref 30.0–36.0)
MCV: 93.4 fL (ref 78.0–100.0)
Platelets: 144 10*3/uL — ABNORMAL LOW (ref 150–400)
RBC: 3.61 MIL/uL — ABNORMAL LOW (ref 3.87–5.11)
RDW: 13.6 % (ref 11.5–15.5)
WBC: 6.2 10*3/uL (ref 4.0–10.5)

## 2014-07-03 LAB — URINALYSIS W MICROSCOPIC (NOT AT ARMC)
BILIRUBIN URINE: NEGATIVE
Glucose, UA: NEGATIVE mg/dL
Ketones, ur: NEGATIVE mg/dL
LEUKOCYTES UA: NEGATIVE
Nitrite: NEGATIVE
Protein, ur: NEGATIVE mg/dL
SPECIFIC GRAVITY, URINE: 1.006 (ref 1.005–1.030)
UROBILINOGEN UA: 0.2 mg/dL (ref 0.0–1.0)
pH: 5.5 (ref 5.0–8.0)

## 2014-07-03 LAB — CBC WITH DIFFERENTIAL/PLATELET
Basophils Absolute: 0 10*3/uL (ref 0.0–0.1)
Basophils Relative: 0 % (ref 0–1)
Eosinophils Absolute: 0.1 10*3/uL (ref 0.0–0.7)
Eosinophils Relative: 1 % (ref 0–5)
HCT: 34.6 % — ABNORMAL LOW (ref 36.0–46.0)
HEMOGLOBIN: 11.8 g/dL — AB (ref 12.0–15.0)
Lymphocytes Relative: 16 % (ref 12–46)
Lymphs Abs: 1.1 10*3/uL (ref 0.7–4.0)
MCH: 31.3 pg (ref 26.0–34.0)
MCHC: 34.1 g/dL (ref 30.0–36.0)
MCV: 91.8 fL (ref 78.0–100.0)
Monocytes Absolute: 0.6 10*3/uL (ref 0.1–1.0)
Monocytes Relative: 9 % (ref 3–12)
Neutro Abs: 5.1 10*3/uL (ref 1.7–7.7)
Neutrophils Relative %: 74 % (ref 43–77)
Platelets: 169 10*3/uL (ref 150–400)
RBC: 3.77 MIL/uL — ABNORMAL LOW (ref 3.87–5.11)
RDW: 13.6 % (ref 11.5–15.5)
WBC: 7 10*3/uL (ref 4.0–10.5)

## 2014-07-03 LAB — COMPREHENSIVE METABOLIC PANEL
ALT: 27 U/L (ref 14–54)
AST: 47 U/L — ABNORMAL HIGH (ref 15–41)
Albumin: 4 g/dL (ref 3.5–5.0)
Alkaline Phosphatase: 99 U/L (ref 38–126)
Anion gap: 14 (ref 5–15)
BUN: 11 mg/dL (ref 6–20)
CO2: 20 mmol/L — AB (ref 22–32)
Calcium: 9.1 mg/dL (ref 8.9–10.3)
Chloride: 97 mmol/L — ABNORMAL LOW (ref 101–111)
Creatinine, Ser: 0.77 mg/dL (ref 0.44–1.00)
GLUCOSE: 69 mg/dL (ref 65–99)
POTASSIUM: 4.3 mmol/L (ref 3.5–5.1)
Sodium: 131 mmol/L — ABNORMAL LOW (ref 135–145)
Total Bilirubin: 1.1 mg/dL (ref 0.3–1.2)
Total Protein: 6.9 g/dL (ref 6.5–8.1)

## 2014-07-03 LAB — CREATININE, SERUM: CREATININE: 0.83 mg/dL (ref 0.44–1.00)

## 2014-07-03 LAB — LIPASE, BLOOD
LIPASE: 954 U/L — AB (ref 22–51)
LIPASE: 335 U/L — AB (ref 22–51)

## 2014-07-03 LAB — ETHANOL: Alcohol, Ethyl (B): <5 mg/dL (ref <5)

## 2014-07-03 MED ORDER — CHLORHEXIDINE GLUCONATE 0.12 % MT SOLN
15.0000 mL | Freq: Two times a day (BID) | OROMUCOSAL | Status: DC
  Filled 2014-07-03 (×6): qty 15

## 2014-07-03 MED ORDER — SODIUM CHLORIDE 0.9 % IV SOLN
INTRAVENOUS | Status: DC
  Administered 2014-07-04: 04:00:00 via INTRAVENOUS
  Administered 2014-07-05: 75 mL/h via INTRAVENOUS

## 2014-07-03 MED ORDER — NICOTINE 21 MG/24HR TD PT24
21.0000 mg | MEDICATED_PATCH | Freq: Every day | TRANSDERMAL | Status: DC
  Administered 2014-07-03 – 2014-07-05 (×3): 21 mg via TRANSDERMAL
  Filled 2014-07-03 (×3): qty 1

## 2014-07-03 MED ORDER — CETYLPYRIDINIUM CHLORIDE 0.05 % MT LIQD: 7.0000 mL | Freq: Two times a day (BID) | OROMUCOSAL | Status: DC

## 2014-07-03 MED ORDER — ONDANSETRON HCL 4 MG/2ML IJ SOLN
4.0000 mg | Freq: Four times a day (QID) | INTRAMUSCULAR | Status: DC | PRN
  Administered 2014-07-03 – 2014-07-04 (×5): 4 mg via INTRAVENOUS
  Filled 2014-07-03 (×5): qty 2

## 2014-07-03 MED ORDER — ONDANSETRON HCL 4 MG PO TABS: 4.0000 mg | ORAL_TABLET | Freq: Four times a day (QID) | ORAL | Status: DC | PRN

## 2014-07-03 MED ORDER — SODIUM CHLORIDE 0.9 % IV SOLN: Freq: Once | INTRAVENOUS | Status: DC

## 2014-07-03 MED ORDER — HYDROMORPHONE HCL 1 MG/ML IJ SOLN
1.0000 mg | Freq: Once | INTRAMUSCULAR | Status: AC
  Administered 2014-07-03: 1 mg via INTRAVENOUS
  Filled 2014-07-03: qty 1

## 2014-07-03 MED ORDER — IPRATROPIUM-ALBUTEROL 0.5-2.5 (3) MG/3ML IN SOLN
3.0000 mL | Freq: Four times a day (QID) | RESPIRATORY_TRACT | Status: DC
  Administered 2014-07-03 – 2014-07-04 (×2): 3 mL via RESPIRATORY_TRACT
  Filled 2014-07-03 (×2): qty 3

## 2014-07-03 MED ORDER — VITAMIN B-1 100 MG PO TABS
100.0000 mg | ORAL_TABLET | Freq: Every day | ORAL | Status: DC
Start: 2014-07-03 — End: 2014-07-05
  Administered 2014-07-05: 100 mg via ORAL
  Filled 2014-07-03 (×3): qty 1

## 2014-07-03 MED ORDER — HYDRALAZINE HCL 20 MG/ML IJ SOLN: 10.0000 mg | Freq: Four times a day (QID) | INTRAMUSCULAR | Status: DC | PRN

## 2014-07-03 MED ORDER — SODIUM CHLORIDE 0.9 % IV SOLN
INTRAVENOUS | Status: AC
  Administered 2014-07-03: 18:00:00 via INTRAVENOUS

## 2014-07-03 MED ORDER — ADULT MULTIVITAMIN W/MINERALS CH
1.0000 | ORAL_TABLET | Freq: Every day | ORAL | Status: DC
  Administered 2014-07-04: 1 via ORAL
  Filled 2014-07-03 (×3): qty 1

## 2014-07-03 MED ORDER — PROMETHAZINE HCL 25 MG/ML IJ SOLN
12.5000 mg | Freq: Once | INTRAMUSCULAR | Status: AC
  Administered 2014-07-03: 12.5 mg via INTRAVENOUS
  Filled 2014-07-03: qty 1

## 2014-07-03 MED ORDER — ALBUTEROL SULFATE (2.5 MG/3ML) 0.083% IN NEBU
2.5000 mg | INHALATION_SOLUTION | Freq: Four times a day (QID) | RESPIRATORY_TRACT | Status: DC | PRN
  Administered 2014-07-03: 2.5 mg via RESPIRATORY_TRACT
  Filled 2014-07-03: qty 3

## 2014-07-03 MED ORDER — ALBUTEROL SULFATE HFA 108 (90 BASE) MCG/ACT IN AERS: 2.0000 | INHALATION_SPRAY | Freq: Four times a day (QID) | RESPIRATORY_TRACT | Status: DC | PRN

## 2014-07-03 MED ORDER — ACETAMINOPHEN 325 MG PO TABS: 650.0000 mg | ORAL_TABLET | Freq: Four times a day (QID) | ORAL | Status: DC | PRN

## 2014-07-03 MED ORDER — ONDANSETRON HCL 4 MG/2ML IJ SOLN
4.0000 mg | Freq: Once | INTRAMUSCULAR | Status: AC
  Administered 2014-07-03: 4 mg via INTRAVENOUS
  Filled 2014-07-03: qty 2

## 2014-07-03 MED ORDER — ACETAMINOPHEN 650 MG RE SUPP: 650.0000 mg | Freq: Four times a day (QID) | RECTAL | Status: DC | PRN

## 2014-07-03 MED ORDER — ENOXAPARIN SODIUM 40 MG/0.4ML ~~LOC~~ SOLN: 40.0000 mg | SUBCUTANEOUS | Status: DC

## 2014-07-03 MED ORDER — LORAZEPAM 2 MG/ML IJ SOLN
1.0000 mg | Freq: Four times a day (QID) | INTRAMUSCULAR | Status: DC | PRN
  Administered 2014-07-04 – 2014-07-05 (×4): 1 mg via INTRAVENOUS
  Filled 2014-07-03 (×4): qty 1

## 2014-07-03 MED ORDER — FOLIC ACID 1 MG PO TABS
1.0000 mg | ORAL_TABLET | Freq: Every day | ORAL | Status: DC
  Administered 2014-07-04 – 2014-07-05 (×2): 1 mg via ORAL
  Filled 2014-07-03 (×3): qty 1

## 2014-07-03 MED ORDER — HYDROMORPHONE HCL 1 MG/ML IJ SOLN
1.0000 mg | INTRAMUSCULAR | Status: DC | PRN
  Administered 2014-07-03 – 2014-07-05 (×13): 1 mg via INTRAVENOUS
  Filled 2014-07-03 (×13): qty 1

## 2014-07-03 MED ORDER — ENOXAPARIN SODIUM 30 MG/0.3ML ~~LOC~~ SOLN
30.0000 mg | SUBCUTANEOUS | Status: DC
  Administered 2014-07-03 – 2014-07-04 (×2): 30 mg via SUBCUTANEOUS
  Filled 2014-07-03 (×3): qty 0.3

## 2014-07-03 MED ORDER — LORAZEPAM 1 MG PO TABS
1.0000 mg | ORAL_TABLET | Freq: Four times a day (QID) | ORAL | Status: DC | PRN
  Administered 2014-07-03 – 2014-07-05 (×4): 1 mg via ORAL
  Filled 2014-07-03 (×4): qty 1

## 2014-07-03 MED ORDER — SODIUM CHLORIDE 0.9 % IV BOLUS (SEPSIS)
1000.0000 mL | Freq: Once | INTRAVENOUS | Status: AC
  Administered 2014-07-03: 1000 mL via INTRAVENOUS

## 2014-07-03 MED ORDER — THIAMINE HCL 100 MG/ML IJ SOLN: 100.0000 mg | Freq: Every day | INTRAMUSCULAR | Status: DC

## 2014-07-03 MED ORDER — LORAZEPAM 2 MG/ML IJ SOLN
1.0000 mg | Freq: Four times a day (QID) | INTRAMUSCULAR | Status: DC | PRN
  Administered 2014-07-03: 1 mg via INTRAVENOUS
  Filled 2014-07-03: qty 1

## 2014-07-03 NOTE — H&P (Signed)
[facility] History and Physical  [name] TIR:[id number] DOB: 1950-04-03 DOA: 07/03/2014  Referring physician: Ms. Jeannett Senior, PA PCP: Ricke Hey, MD  Specialists:   Chief Complaint: Abdominal pain, nausea and vomiting.  HPI: [name] is a 64 y.o. female  With a history of pancreatitis, substance abuse, COPD represented to the emergency department with complaints of abdominal pain, nausea and vomiting. Patient states that her pain started proximally 3 days ago. She denies any alcohol use at that time. Patient denies any sick contacts at that time. She does state that yesterday she did have a beer. She states that sometimes her pancreatitis is worse when she has our units with her husband. Patient states she has not been eating well and has poor appetite due to the continued nausea and vomiting and abdominal pain. She states eating makes her pain worse. In the emergency department, patient was noted to have a lipase of over 900. [facility] was called for admission.  Review of Systems:  Constitutional: Complains of poor appetite. HEENT: Denies photophobia, eye pain, redness, hearing loss, ear pain, congestion, sore throat, rhinorrhea, sneezing, mouth sores, trouble swallowing, neck pain, neck stiffness and tinnitus.   Respiratory: Denies SOB, DOE, cough, chest tightness,  and wheezing.   Cardiovascular: Denies chest pain, palpitations and leg swelling.  Gastrointestinal: Complains of abdominal pain and nausea, vomiting. Denies diarrhea or constipation. Genitourinary: Denies dysuria, urgency, frequency, hematuria, flank pain and difficulty urinating.  Musculoskeletal: Complains of chronic pain. Skin: Denies pallor, rash and wound.  Neurological: Denies dizziness, seizures, syncope, weakness, light-headedness, numbness and headaches.  Hematological: Denies adenopathy. Easy bruising, personal or family bleeding history  Psychiatric/Behavioral: Denies suicidal  ideation, mood changes, confusion, nervousness, sleep disturbance and agitation  Past Medical History  Diagnosis Date  . Pancreatitis   . COPD (chronic obstructive pulmonary disease)   . Substance abuse   . Cancer     renal ca  . Seizures   . Pancreatitis    Past Surgical History  Procedure Laterality Date  . Partial gastrectomy    . Kidney surgery      removed cancerous lesions   Social History:  reports that she has been smoking Cigarettes.  She has a 30 pack-year smoking history. She has never used smokeless tobacco. She reports that she drinks alcohol. She reports that she uses illicit drugs.   Allergies  Allergen Reactions  . Aspirin Other (See Comments)    bleeding  . Chlorpromazine Hcl Other (See Comments)    Muscle spasms  . Codeine Nausea Only    Family History  Problem Relation Age of Onset  . CAD Mother   . Alcoholism Father   . COPD Father   . COPD Father     Prior to Admission medications   Medication Sig Start Date End Date Taking? Authorizing Provider  albuterol (PROVENTIL HFA;VENTOLIN HFA) 108 (90 BASE) MCG/ACT inhaler Inhale 2 puffs into the lungs every 6 (six) hours as needed for wheezing or shortness of breath. 09/23/13  Yes Sherwood Gambler, MD  HYDROmorphone (DILAUDID) 4 MG tablet Take 1 tablet (4 mg total) by mouth every 6 (six) hours as needed for severe pain. 09/23/13  Yes Sherwood Gambler, MD  lisinopril (PRINIVIL,ZESTRIL) 10 MG tablet Take 1 tablet (10 mg total) by mouth daily. 06/06/13  Yes Costin Karlyne Greenspan, MD  LORazepam (ATIVAN) 1 MG tablet Take 1 tablet (1 mg total) by mouth every 6 (six) hours as needed. For anxiety. 04/01/13  Yes [place],  MD  ondansetron (ZOFRAN) 4 MG tablet Take 4 mg by mouth 3 (three) times daily as needed for nausea or vomiting.  05/09/14  Yes Historical Provider, MD  metoCLOPramide (REGLAN) 10 MG tablet Take 1 tablet (10 mg total) by mouth every 6 (six) hours. Patient not taking: Reported on 07/03/2014 05/17/14    Antonietta Breach, PA-C   Physical Exam: Filed Vitals:   07/03/14 1453  BP: 134/95  Pulse: 99  Temp:   Resp: 18     General: Well developed, thin, no distress, appears stated age  HEENT: NCAT, PERRLA, EOMI, Anicteic Sclera, mucous membranes moist.   Neck: Supple, no JVD, no masses  Cardiovascular: S1 S2 auscultated, no rubs, murmurs or gallops. Regular rate and rhythm.  Respiratory: Diffuse expiratory wheezing  Abdomen: Soft, nontender, nondistended, + bowel sounds  Extremities: warm dry without cyanosis clubbing or edema  Neuro: AAOx3, cranial nerves grossly intact. Strength 5/5 in patient's upper and lower extremities bilaterally  Skin: Without rashes exudates or nodules  Psych: Anxious, but appropriate  Labs on Admission:  Basic Metabolic Panel:  Recent Labs Lab 07/03/14 1330  NA 131*  K 4.3  CL 97*  CO2 20*  GLUCOSE 69  BUN 11  CREATININE 0.77  CALCIUM 9.1   Liver Function Tests:  Recent Labs Lab 07/03/14 1330  AST 47*  ALT 27  ALKPHOS 99  BILITOT 1.1  PROT 6.9  ALBUMIN 4.0    Recent Labs Lab 07/03/14 1330  LIPASE 954*   No results for input(s): AMMONIA in the last 168 hours. CBC:  Recent Labs Lab 07/03/14 1330  WBC 7.0  NEUTROABS 5.1  HGB 11.8*  HCT 34.6*  MCV 91.8  PLT 169   Cardiac Enzymes: No results for input(s): CKTOTAL, CKMB, CKMBINDEX, TROPONINI in the last 168 hours.  BNP (last 3 results) No results for input(s): BNP in the last 8760 hours.  ProBNP (last 3 results) No results for input(s): PROBNP in the last 8760 hours.  CBG: No results for input(s): GLUCAP in the last 168 hours.  Radiological Exams on Admission: Dg Abd Acute W/chest  07/03/2014   CLINICAL DATA:  Nausea, vomiting, diarrhea and constipation for 3 days.  EXAM: DG ABDOMEN ACUTE W/ 1V CHEST  COMPARISON:  Chest radiograph 06/01/2013.  FINDINGS: Frontal view of the chest shows midline trachea and normal heart size. Lungs are hyperinflated but clear. No  pleural fluid.  Two views of the abdomen show a normal bowel gas pattern. No small bowel dilatation. Scattered gas and stool in the colon. No unexpected radiopaque calculi. Atherosclerotic calcification of the arterial vasculature.  IMPRESSION: No acute findings.   Electronically Signed   By: Lorin Picket M.D.   On: 07/03/2014 14:56    EKG: None  Assessment/Plan  Acute pancreatitis  -Patient will be admitted to medical floor -Lipase 954, will repeat in the morning.  -Abdominal x-ray: No acute findings -Patient currently afebrile umbilicus or ptosis -Will continue conservative management with IV fluids, bowel rest, antiemetics, pain control -will allow for ice chips  Abdominal pain with nausea  -secondary to Acute pancreatitis -Plan and treatment as above  Hypertension -Will hold lisinopril as patient will be NPO -Will add on hydralazine IV PRN  Alcohol abuse -Patient counseled on the need for cessation -Alcohol level pending -Will place patient on CIWA protocol  Nicotine abuse -Patient counseled regarding cessation -Will place patient on nicotine patch  Malnutrition -Will be NPO for now -Consider nutrition consult once able to tolerate PO  Chronic  pain and anxiety -Supposedly secondary to pancreatitis -Patient receives Dilaudid 4 mg every 6 hours for chronic pain along with Ativan 1 mg every 6 hours from her primary care physician  COPD -Patient states she uses an albuterol inhaler at home -Will place her on nebulizer treatments as needed as well as supplemental oxygen to maintain saturations above 92%  DVT prophylaxis: Lovenox  Code Status: Full  Condition: Guarded  Family Communication: None at bedside. Admission, patients condition and plan of care including tests being ordered have been discussed with the patient an who indicate understanding and agree with the plan and Code Status.  Disposition Plan: Admitted  Time spent: 65 minutes  Camari Wisham  D.O. [facility] Pager [phone]  If 7PM-7AM, please contact night-coverage [email or web address] Password [email or web address] 07/03/2014, 3:31 PM

## 2014-07-03 NOTE — ED Provider Notes (Signed)
CSN: [id number]     Arrival date & time 07/03/14  1303 History   First MD Initiated Contact with Patient 07/03/14 1335     Chief Complaint  Patient presents with  . Abdominal Pain     (Consider location/radiation/quality/duration/timing/severity/associated sxs/prior Treatment) HPI [name] is a 64 y.o. female with history of COPD, pancreatitis, substance abuse, alcohol abuse, seizures, presents to emergency department complaining of abdominal pain. Patient states symptoms started 3 days ago, gradually worsening. She reports nausea, no vomiting. Denies diarrhea. Denies dark or tarry stools, no bright red blood in stool or emesis. States she quit drinking however admits to drinking a beer yesterday. She denies any fever or chills. She did not take any medications for this, however she already takes 4 mg of Dilaudid every 6 hours for chronic pain. She also has Zofran at home for nausea.  Past Medical History  Diagnosis Date  . Pancreatitis   . COPD (chronic obstructive pulmonary disease)   . Substance abuse   . Cancer     renal ca  . Seizures   . Pancreatitis    Past Surgical History  Procedure Laterality Date  . Partial gastrectomy    . Kidney surgery      removed cancerous lesions   Family History  Problem Relation Age of Onset  . CAD Mother   . Alcoholism Father   . COPD Father   . COPD Father    History  Substance Use Topics  . Smoking status: Current Every Day Smoker -- 1.00 packs/day for 30 years    Types: Cigarettes  . Smokeless tobacco: Never Used  . Alcohol Use: 0.0 oz/week    0 Standard drinks or equivalent per week   OB History    No data available     Review of Systems  Constitutional: Negative for fever and chills.  Respiratory: Negative for cough, chest tightness and shortness of breath.   Cardiovascular: Negative for chest pain, palpitations and leg swelling.  Gastrointestinal: Positive for nausea and abdominal pain. Negative for vomiting and  diarrhea.  Genitourinary: Negative for dysuria, flank pain and pelvic pain.  Musculoskeletal: Negative for myalgias, arthralgias, neck pain and neck stiffness.  Skin: Negative for rash.  Neurological: Negative for dizziness, weakness and headaches.  All other systems reviewed and are negative.     Allergies  Aspirin; Chlorpromazine hcl; and Codeine  Home Medications   Prior to Admission medications   Medication Sig Start Date End Date Taking? Authorizing Provider  albuterol (PROVENTIL HFA;VENTOLIN HFA) 108 (90 BASE) MCG/ACT inhaler Inhale 2 puffs into the lungs every 6 (six) hours as needed for wheezing or shortness of breath. 09/23/13  Yes Sherwood Gambler, MD  HYDROmorphone (DILAUDID) 4 MG tablet Take 1 tablet (4 mg total) by mouth every 6 (six) hours as needed for severe pain. 09/23/13  Yes Sherwood Gambler, MD  lisinopril (PRINIVIL,ZESTRIL) 10 MG tablet Take 1 tablet (10 mg total) by mouth daily. 06/06/13  Yes Costin Karlyne Greenspan, MD  LORazepam (ATIVAN) 1 MG tablet Take 1 tablet (1 mg total) by mouth every 6 (six) hours as needed. For anxiety. 04/01/13  Yes Erline Hau, MD  ondansetron (ZOFRAN) 4 MG tablet Take 4 mg by mouth 3 (three) times daily as needed for nausea or vomiting.  05/09/14  Yes Historical Provider, MD  metoCLOPramide (REGLAN) 10 MG tablet Take 1 tablet (10 mg total) by mouth every 6 (six) hours. Patient not taking: Reported on 07/03/2014 05/17/14   Antonietta Breach,  PA-C   BP 134/95 mmHg  Pulse 99  Temp(Src) 98 F (36.7 C) (Oral)  Resp 18  Ht '5\' 4"'$  (1.626 m)  Wt 87 lb (39.463 kg)  BMI 14.93 kg/m2  SpO2 100% Physical Exam  Constitutional: She appears well-developed and well-nourished. No distress.  HENT:  Head: Normocephalic.  Eyes: Conjunctivae are normal.  Neck: Neck supple.  Cardiovascular: Normal rate, regular rhythm and normal heart sounds.   Pulmonary/Chest: Effort normal and breath sounds normal. No respiratory distress. She has no wheezes. She has no  rales.  Abdominal: Soft. Bowel sounds are normal. She exhibits no distension. There is tenderness. There is no rebound.  Epigastric tenderness  Musculoskeletal: She exhibits no edema.  Neurological: She is alert.  Skin: Skin is warm and dry.  Psychiatric: She has a normal mood and affect. Her behavior is normal.  Nursing note and vitals reviewed.   ED Course  Procedures (including critical care time) Labs Review Labs Reviewed  CBC WITH DIFFERENTIAL/PLATELET - Abnormal; Notable for the following:    RBC 3.77 (*)    Hemoglobin 11.8 (*)    HCT 34.6 (*)    All other components within normal limits  COMPREHENSIVE METABOLIC PANEL - Abnormal; Notable for the following:    Sodium 131 (*)    Chloride 97 (*)    CO2 20 (*)    AST 47 (*)    All other components within normal limits  LIPASE, BLOOD - Abnormal; Notable for the following:    Lipase 954 (*)    All other components within normal limits  URINALYSIS W MICROSCOPIC  ETHANOL    Imaging Review Dg Abd Acute W/chest  07/03/2014   CLINICAL DATA:  Nausea, vomiting, diarrhea and constipation for 3 days.  EXAM: DG ABDOMEN ACUTE W/ 1V CHEST  COMPARISON:  Chest radiograph 06/01/2013.  FINDINGS: Frontal view of the chest shows midline trachea and normal heart size. Lungs are hyperinflated but clear. No pleural fluid.  Two views of the abdomen show a normal bowel gas pattern. No small bowel dilatation. Scattered gas and stool in the colon. No unexpected radiopaque calculi. Atherosclerotic calcification of the arterial vasculature.  IMPRESSION: No acute findings.   Electronically Signed   By: Lorin Picket M.D.   On: 07/03/2014 14:56     EKG Interpretation None      MDM   Final diagnoses:  Abdominal pain  Acute pancreatitis, unspecified pancreatitis type    patient with epigastric pain, nausea for 3 days. History of pancreatitis. Will get labs. Patient has had no relief with Dilaudid at home. Will try IV Dilaudid, Zofran.  3:07  PM Patient continues to have pain and nausea. Will try Phenergan. She received 2 doses of IV Dilaudid now. The pain is not improving. Vital signs are normal. Lipase elevated at 54. Will admit for pain management and IV fluids.  Spoke with [facility], will admit.   Filed Vitals:   07/03/14 1308 07/03/14 1311 07/03/14 1453  BP:  144/96 134/95  Pulse:  96 99  Temp:  98 F (36.7 C)   TempSrc:  Oral   Resp:  18 18  Height:  '5\' 4"'$  (1.626 m)   Weight:  87 lb (39.463 kg)   SpO2: 98% 99% 100%     Jeannett Senior, PA-C 07/04/14 [place], MD 07/05/14 1203

## 2014-07-03 NOTE — ED Notes (Signed)
Patient is made aware that an urine sample is needed. Patient is encouraged to void when able. 

## 2014-07-03 NOTE — ED Notes (Signed)
Bed: CQ19 Expected date:  Expected time:  Means of arrival:  Comments: EMS- 64yo F, abdominal pain, Hx of pancreatitis

## 2014-07-03 NOTE — ED Notes (Signed)
I attempted to collect labs and was unsuccessful.  I made nurse aware. 

## 2014-07-03 NOTE — ED Notes (Signed)
Per EMS pt complaint of mid to lower abdominal pain for 3 days; nausea; no emesis or diarrhea; pt hx of pancreatitis; one beer yesterday. Pt given zofran en route with EMS; pt reports some relief. Pt takes 4 mg dilaudid at home x4 a day; unrelieving current pain.

## 2014-07-03 NOTE — ED Notes (Signed)
Two unsuccessful attempts by ED staff to draw ethanol as ordered; main lab phelbotomy made aware; ethanol needs drawn.

## 2014-07-03 NOTE — Progress Notes (Signed)
Utilization Review completed.  Nicki Gracy RN CM  

## 2014-07-03 NOTE — ED Notes (Addendum)
Unsuccessful attempt to draw ethanol by this RN.

## 2014-07-04 LAB — CBC
HCT: 27.4 % — ABNORMAL LOW (ref 36.0–46.0)
HEMOGLOBIN: 9.6 g/dL — AB (ref 12.0–15.0)
MCH: 32.2 pg (ref 26.0–34.0)
MCHC: 35 g/dL (ref 30.0–36.0)
MCV: 91.9 fL (ref 78.0–100.0)
Platelets: UNDETERMINED 10*3/uL (ref 150–400)
RBC: 2.98 MIL/uL — AB (ref 3.87–5.11)
RDW: 14 % (ref 11.5–15.5)
WBC: 3.6 10*3/uL — ABNORMAL LOW (ref 4.0–10.5)

## 2014-07-04 LAB — BASIC METABOLIC PANEL
Anion gap: 10 (ref 5–15)
BUN: 10 mg/dL (ref 6–20)
CHLORIDE: 106 mmol/L (ref 101–111)
CO2: 18 mmol/L — ABNORMAL LOW (ref 22–32)
Calcium: 8.8 mg/dL — ABNORMAL LOW (ref 8.9–10.3)
Creatinine, Ser: 0.81 mg/dL (ref 0.44–1.00)
GFR calc Af Amer: >60 mL/min (ref >60)
Glucose, Bld: 66 mg/dL (ref 65–99)
POTASSIUM: 4.5 mmol/L (ref 3.5–5.1)
Sodium: 134 mmol/L — ABNORMAL LOW (ref 135–145)

## 2014-07-04 IMAGING — CR DG FOOT COMPLETE 3+V*R*
3 series · 3 of 3 positions shown · non-contrast
Comparison: None.

CLINICAL DATA: Fall.  Posterior ankle pain.

RIGHT FOOT COMPLETE - 3+ VIEW

[x foot ap right]
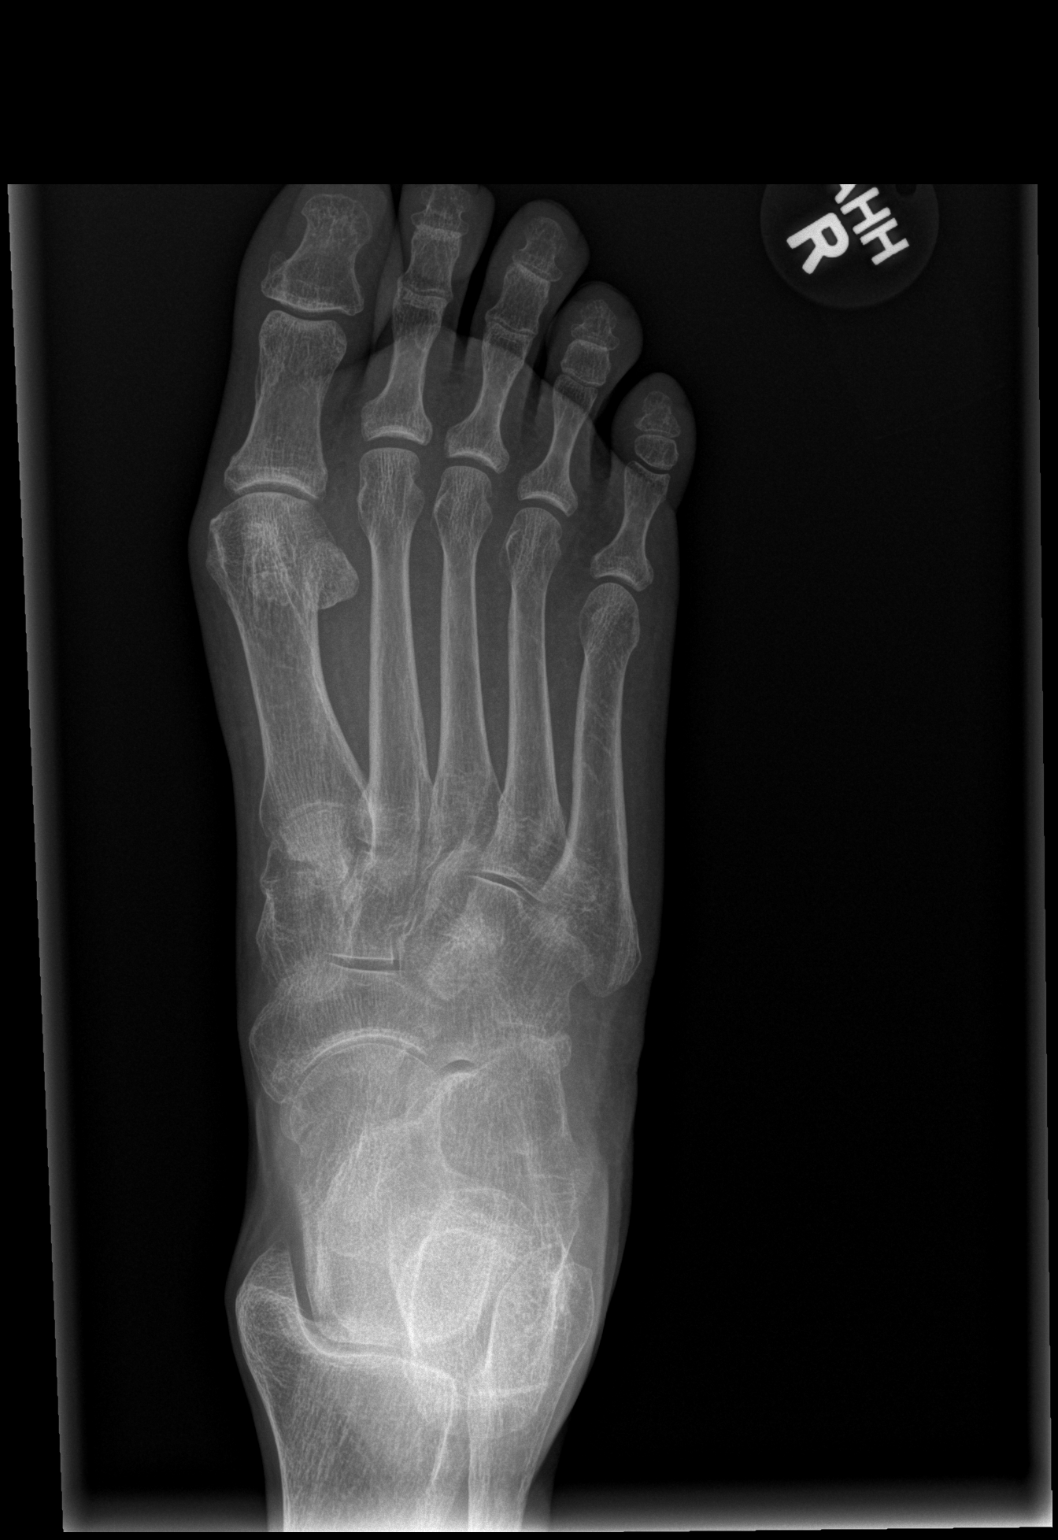

[x foot obl right]
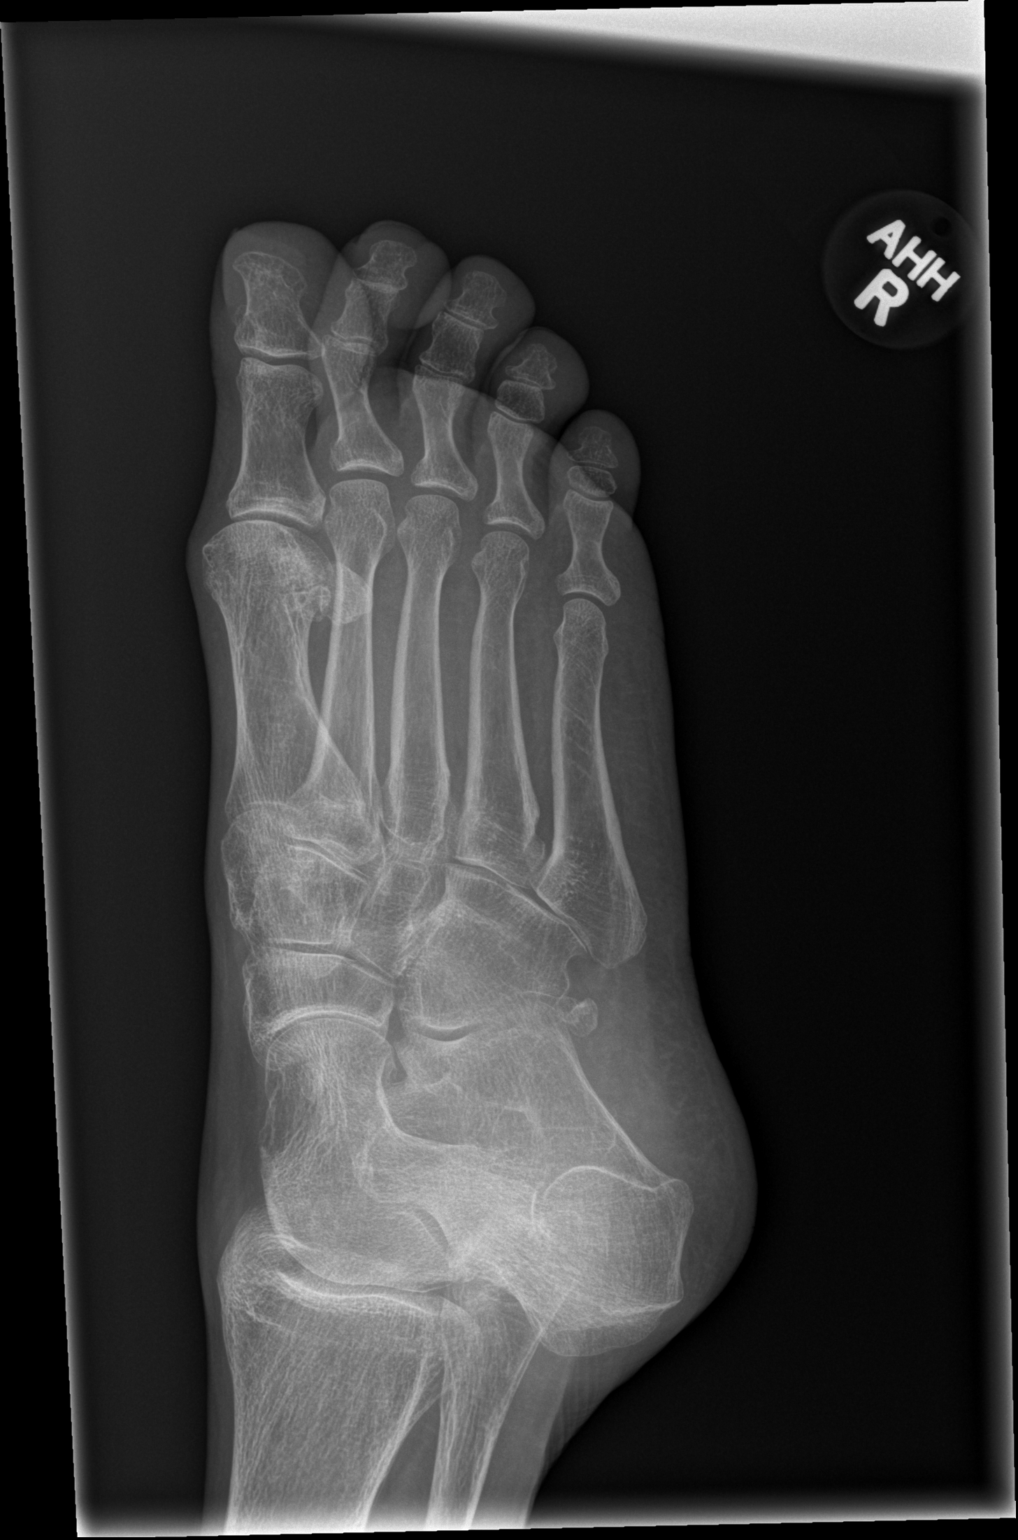

[x foot lat right]
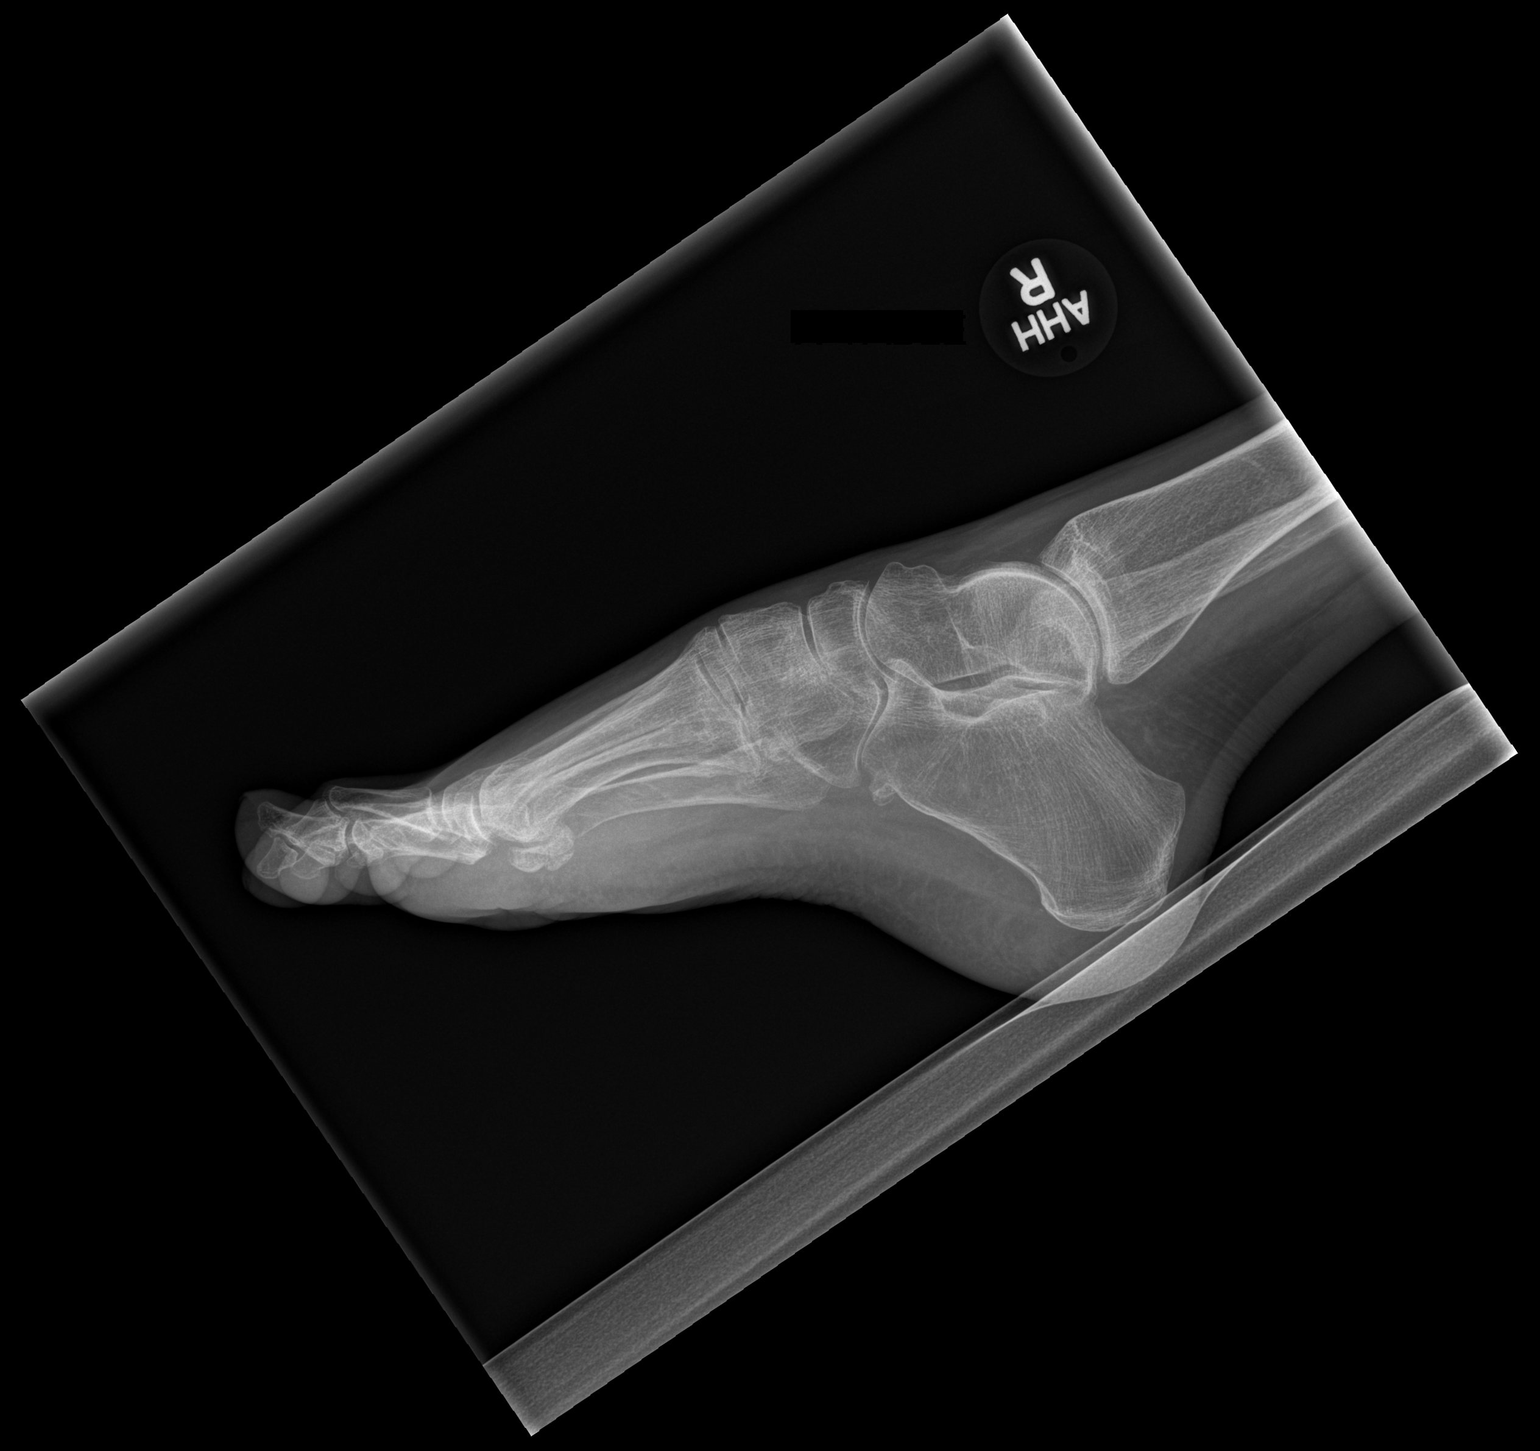

[3 of 3 positions shown; findings below may reference images not displayed]

FINDINGS: Moderate osteopenia is evident.  Minimal degenerative
changes are present within the hind foot.  No acute abnormality is
present.
IMPRESSION: 1.  No acute abnormality.
2.  Moderate osteopenia.

## 2014-07-04 IMAGING — CR DG ANKLE COMPLETE 3+V*R*
3 series · 3 of 3 positions shown · non-contrast
Comparison: None.

CLINICAL DATA: Fall.  Posterior ankle pain.

RIGHT ANKLE - COMPLETE 3+ VIEW

[x ankle ap right]
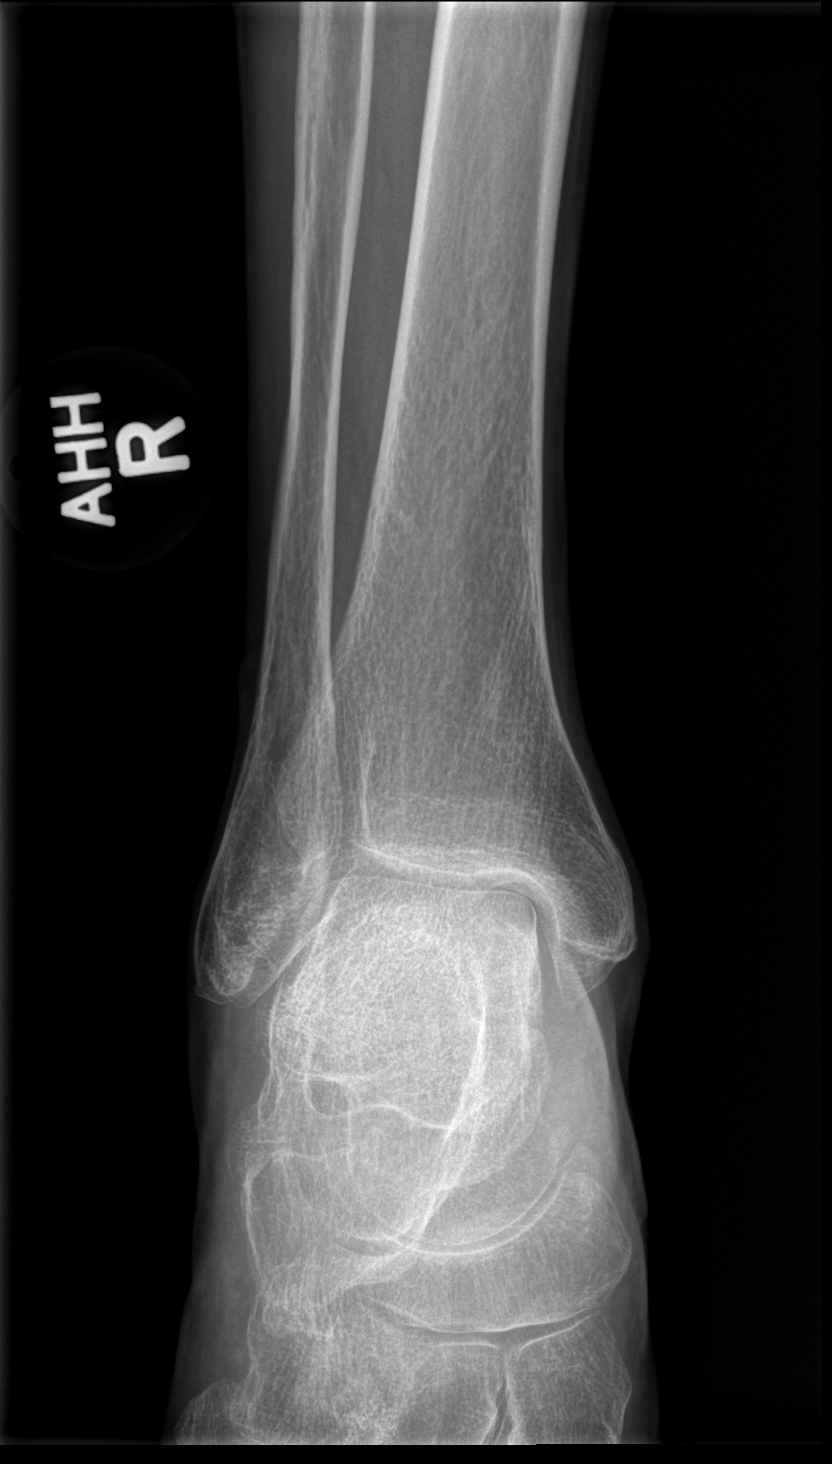

[x ankle obl right]
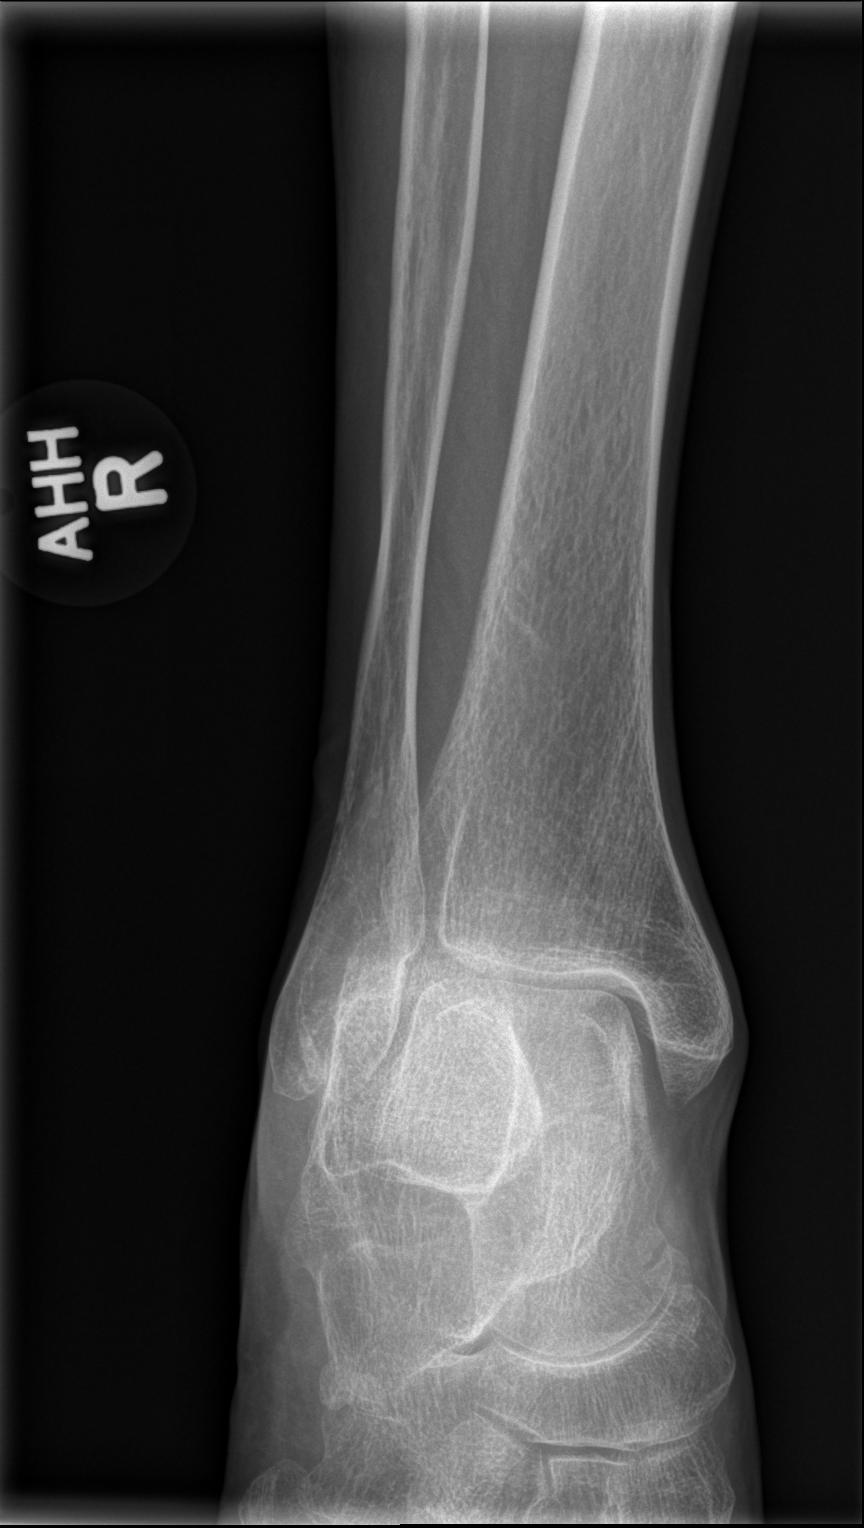

[x ankle lat right]
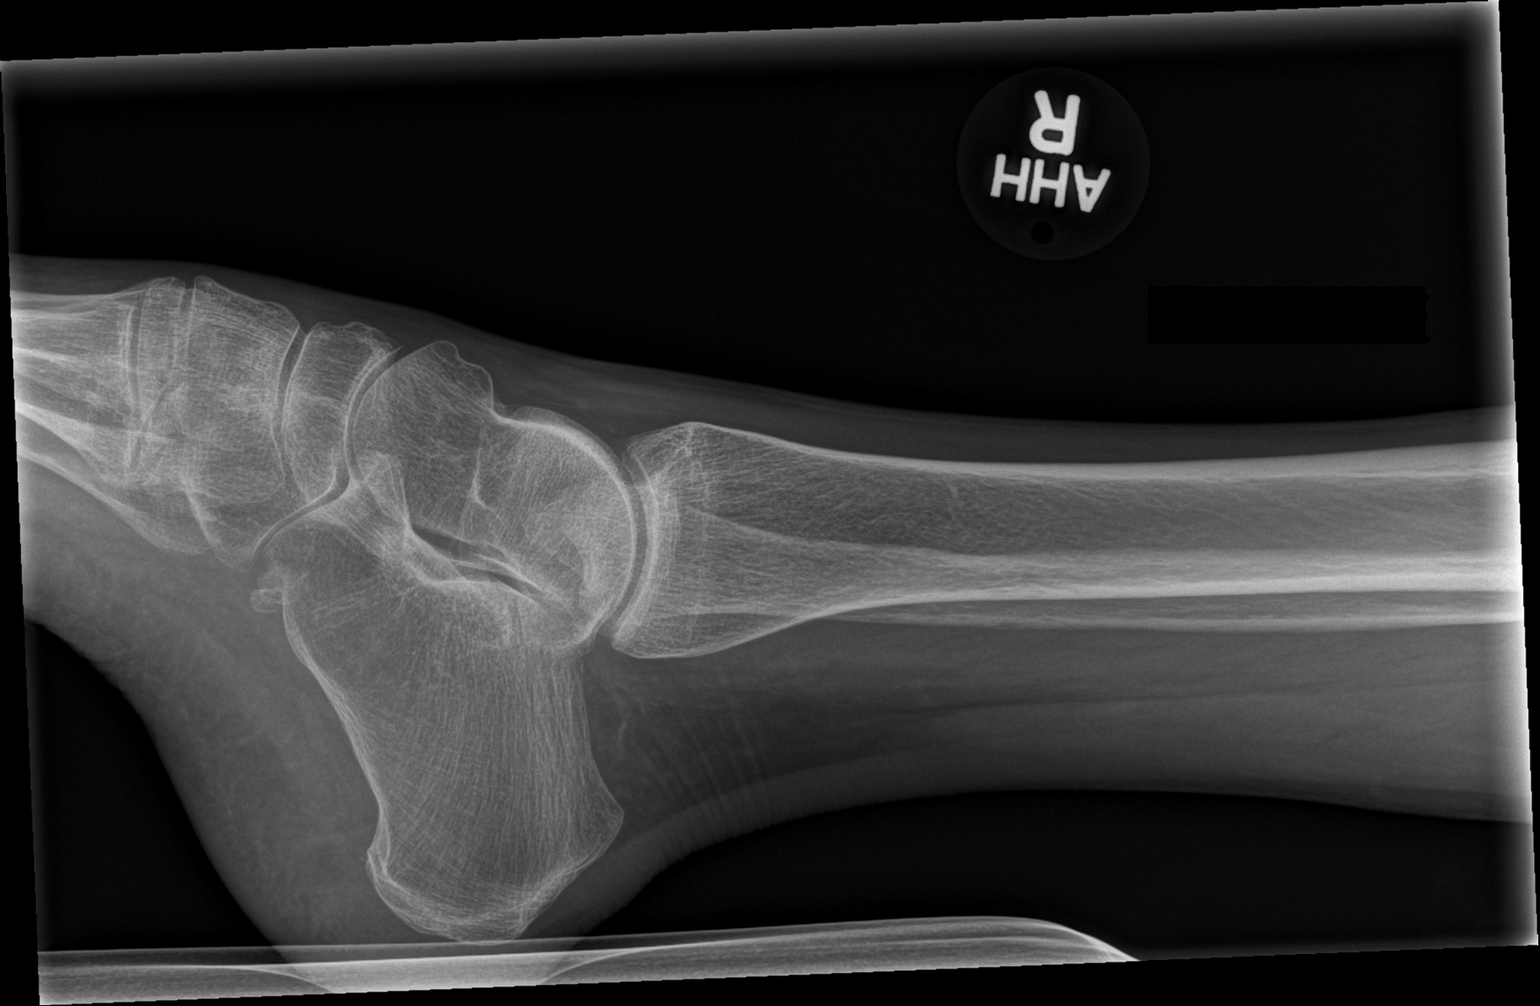

[3 of 3 positions shown; findings below may reference images not displayed]

FINDINGS: Ankle joint is located.  Moderate osteopenia is present.
No acute bone or soft tissue abnormality is present.
IMPRESSION: 1.  No acute abnormality.
2.  Moderate osteopenia.

## 2014-07-04 MED ORDER — ALBUTEROL SULFATE (2.5 MG/3ML) 0.083% IN NEBU: 2.5000 mg | INHALATION_SOLUTION | RESPIRATORY_TRACT | Status: DC | PRN

## 2014-07-04 MED ORDER — IPRATROPIUM-ALBUTEROL 0.5-2.5 (3) MG/3ML IN SOLN
3.0000 mL | Freq: Four times a day (QID) | RESPIRATORY_TRACT | Status: DC
  Administered 2014-07-04 – 2014-07-05 (×6): 3 mL via RESPIRATORY_TRACT
  Filled 2014-07-04 (×6): qty 3

## 2014-07-04 NOTE — Progress Notes (Addendum)
[facility]                                                                              Patient Demographics  [name], is a 64 y.o. female, DOB - 1950/05/28, JAS:[id number]  Admit date - 07/03/2014   Admitting Physician Cristal Ford, DO  Outpatient Primary MD for the patient is Ricke Hey, MD  LOS - 1   Chief Complaint  Patient presents with  . Abdominal Pain      HPI on 07/03/2014  [name] is a 64 y.o. female with a history of pancreatitis, substance abuse, COPD represented to the emergency department with complaints of abdominal pain, nausea and vomiting. Patient states that her pain started proximally 3 days ago. She denies any alcohol use at that time. Patient denies any sick contacts at that time. She does state that yesterday she did have a beer. She states that sometimes her pancreatitis is worse when she has our units with her husband. Patient states she has not been eating well and has poor appetite due to the continued nausea and vomiting and abdominal pain. She states eating makes her pain worse. In the emergency department, patient was noted to have a lipase of over 900. [facility] was called for admission.  Assessment & Plan   Acute pancreatitis  -Patient will be admitted to medical floor -Lipase 954 upon admission.  Trending downward, currently 335 -Abdominal x-ray: No acute findings -Patient currently afebrile, no luekocytosis  -Continue IV fluids, bowel rest, antiemetics, pain control -Will start patient on clear liquid diet  Abdominal pain with nausea  -secondary to Acute pancreatitis -Plan and treatment as above -Patient stated her nausea was improving, but wanted to receive zofran more frequently -She states she wants to eat but still has abdominal soreness   Hypertension -Continue to hold lisinopril -Continue hydralazine IV PRN  Alcohol abuse -Patient counseled on the need for cessation -Alcohol level <5 -Continue CIWA  protocol  Nicotine abuse -Patient counseled regarding cessation -Continue nicotine patch  Malnutrition -Will start patient on clear liquid diet -Will consult nutrition  Chronic pain and anxiety -Supposedly secondary to pancreatitis -Patient receives Dilaudid 4 mg every 6 hours for chronic pain along with Ativan 1 mg every 6 hours from her primary care physician  COPD -Patient states she uses an albuterol inhaler at home -Continue nebulizer treatments as needed as well as supplemental oxygen to maintain saturations above 92%  Normocytic Anemia -Baseline Hb around 11 -Currently hb 9.6, drop likely dilutional -Continue to monitor CBC  Code Status: Full  Family Communication: None at bedside  Disposition Plan: Admitted.  Will start patient on clear liquids and continue to monitor for withdrawal  Time Spent in minutes   30 minutes  Procedures  None  Consults   None  DVT Prophylaxis  Lovenox  Lab Results  Component Value Date   PLT PLATELET CLUMPS NOTED ON SMEAR, UNABLE TO ESTIMATE 07/04/2014    Medications  Scheduled Meds: . antiseptic oral rinse  7 mL Mouth Rinse q12n4p  . chlorhexidine  15 mL Mouth Rinse BID  . enoxaparin (LOVENOX) injection  30 mg Subcutaneous Q24H  . folic acid  1 mg Oral  Daily  . ipratropium-albuterol  3 mL Nebulization QID  . multivitamin with minerals  1 tablet Oral Daily  . nicotine  21 mg Transdermal Daily  . thiamine  100 mg Oral Daily   Or  . thiamine  100 mg Intravenous Daily   Continuous Infusions: . sodium chloride 75 mL/hr at 07/04/14 0336   PRN Meds:.acetaminophen **OR** acetaminophen, albuterol, hydrALAZINE, HYDROmorphone (DILAUDID) injection, LORazepam **OR** LORazepam, ondansetron **OR** ondansetron (ZOFRAN) IV  Antibiotics    Anti-infectives    None      Subjective:   Eileen Stanford seen and examined today.  Patient complains of abdominal soreness and nausea but states they are better. Patient states she would like  her pain medication, Ativan, Zofran more frequently. However she states she is hungry. She denies chest pain, shortness of breath, vomiting, constipation or diarrhea.  Objective:   Filed Vitals:   07/03/14 2250 07/04/14 0241 07/04/14 0552 07/04/14 0909  BP:   118/70   Pulse:   73   Temp:   97.7 F (36.5 C)   TempSrc:   Oral   Resp:   16   Height:      Weight:   40.5 kg (89 lb 4.6 oz)   SpO2: 98% 95% 99% 98%    Wt Readings from Last 3 Encounters:  07/04/14 40.5 kg (89 lb 4.6 oz)  04/23/14 37.739 kg (83 lb 3.2 oz)  01/08/14 40.37 kg (89 lb)    No intake or output data in the 24 hours ending 07/04/14 1200  Exam  General: Well developed, thin,  no distress  HEENT: NCAT, mucous membranes moist.   Cardiovascular: S1 S2 auscultated, no rubs, murmurs or gallops. Regular rate and rhythm.  Respiratory: Clear to auscultation bilaterally with equal chest rise  Abdomen: Soft, nontender, nondistended, + bowel sounds  Extremities: warm dry without cyanosis clubbing or edema  Neuro: AAOx3, nonfocal  Psych: Normal affect and demeanor  Data Review   Micro Results No results found for this or any previous visit (from the past 240 hour(s)).  Radiology Reports Dg Abd Acute W/chest  07/03/2014   CLINICAL DATA:  Nausea, vomiting, diarrhea and constipation for 3 days.  EXAM: DG ABDOMEN ACUTE W/ 1V CHEST  COMPARISON:  Chest radiograph 06/01/2013.  FINDINGS: Frontal view of the chest shows midline trachea and normal heart size. Lungs are hyperinflated but clear. No pleural fluid.  Two views of the abdomen show a normal bowel gas pattern. No small bowel dilatation. Scattered gas and stool in the colon. No unexpected radiopaque calculi. Atherosclerotic calcification of the arterial vasculature.  IMPRESSION: No acute findings.   Electronically Signed   By: Lorin Picket M.D.   On: 07/03/2014 14:56    CBC  Recent Labs Lab 07/03/14 1330 07/03/14 1845 07/04/14 0520  WBC 7.0 6.2 3.6*   HGB 11.8* 11.6* 9.6*  HCT 34.6* 33.7* 27.4*  PLT 169 144* PLATELET CLUMPS NOTED ON SMEAR, UNABLE TO ESTIMATE  MCV 91.8 93.4 91.9  MCH 31.3 32.1 32.2  MCHC 34.1 34.4 35.0  RDW 13.6 13.6 14.0  LYMPHSABS 1.1  --   --   MONOABS 0.6  --   --   EOSABS 0.1  --   --   BASOSABS 0.0  --   --     Chemistries   Recent Labs Lab 07/03/14 1330 07/03/14 1845 07/04/14 0520  NA 131*  --  134*  K 4.3  --  4.5  CL 97*  --  106  CO2 20*  --  18*  GLUCOSE 69  --  66  BUN 11  --  10  CREATININE 0.77 0.83 0.81  CALCIUM 9.1  --  8.8*  AST 47*  --   --   ALT 27  --   --   ALKPHOS 99  --   --   BILITOT 1.1  --   --    ------------------------------------------------------------------------------------------------------------------ estimated creatinine clearance is 46.6 mL/min (by C-G formula based on Cr of 0.81). ------------------------------------------------------------------------------------------------------------------ No results for input(s): HGBA1C in the last 72 hours. ------------------------------------------------------------------------------------------------------------------ No results for input(s): CHOL, HDL, LDLCALC, TRIG, CHOLHDL, LDLDIRECT in the last 72 hours. ------------------------------------------------------------------------------------------------------------------ No results for input(s): TSH, T4TOTAL, T3FREE, THYROIDAB in the last 72 hours.  Invalid input(s): FREET3 ------------------------------------------------------------------------------------------------------------------ No results for input(s): VITAMINB12, FOLATE, FERRITIN, TIBC, IRON, RETICCTPCT in the last 72 hours.  Coagulation profile No results for input(s): INR, PROTIME in the last 168 hours.  No results for input(s): DDIMER in the last 72 hours.  Cardiac Enzymes No results for input(s): CKMB, TROPONINI, MYOGLOBIN in the last 168 hours.  Invalid input(s):  CK ------------------------------------------------------------------------------------------------------------------ Invalid input(s): POCBNP    Mikele Sifuentes D.O. on 07/04/2014 at 12:00 PM  Between 7am to 7pm - Pager - [phone]  After 7pm go to [email or web address] - password [email or web address]  And look for the night coverage person covering for me after hours  [facility] Office  [phone]

## 2014-07-04 NOTE — Progress Notes (Signed)
NUTRITION NOTE  New consult received for assessment of nutrition requirements/status. RD saw pt this AM. Please see note from today (6/30) at 10:33 AM. Will continue to follow per protocol.    Jarome Matin, RD, LDN Inpatient Clinical Dietitian Pager # [phone] After hours/weekend pager # [phone]

## 2014-07-04 NOTE — Progress Notes (Signed)
Initial Nutrition Assessment  DOCUMENTATION CODES:  Severe malnutrition in context of chronic illness, Severe malnutrition in context of social or environmental circumstances, Underweight  INTERVENTION: - Will order nutrition supplements with diet advancement  NUTRITION DIAGNOSIS:  Inadequate oral intake related to inability to eat as evidenced by NPO status.  GOAL:  Patient will meet greater than or equal to 90% of their needs  MONITOR:  Diet advancement, Weight trends, Labs, I & O's  REASON FOR ASSESSMENT:  Malnutrition Screening Tool  ASSESSMENT: 64 y.o. female with a history of pancreatitis, substance abuse, COPD represented to the emergency department with complaints of abdominal pain, nausea and vomiting. Patient states that her pain started proximally 3 days ago. She denies any alcohol use at that time. She does state that yesterday she did have a beer. She states that sometimes her pancreatitis is worse when she has our units with her husband. Patient states she has not been eating well and has poor appetite due to the continued nausea and vomiting and abdominal pain. She states eating makes her pain worse. In the emergency department, patient was noted to have a lipase of over 900.   Pt seen for MST. BMI indicates underweight status. Pt reports abdominal pain with nausea, no emesis x4 days PTA. She states she has ongoing pain and nausea this AM. NPO and unable to meet needs. Pt reports very minimal to no intakes during these 4 days.   She states that PTA her weight fluctuated often with no UBW. Per weight hx, current weight stable with weight in 01/2014. Severe muscle and fat wasting noted. Pt with hx of polysubstance abuse.  Medications reviewed. Labs reviewed; lipase: 335 units/L.  Height:  Ht Readings from Last 1 Encounters:  07/03/14 '5\' 4"'$  (1.626 m)    Weight:  Wt Readings from Last 1 Encounters:  07/04/14 89 lb 4.6 oz (40.5 kg)    Ideal Body Weight:  54.54  kg (kg)  Wt Readings from Last 10 Encounters:  07/04/14 89 lb 4.6 oz (40.5 kg)  04/23/14 83 lb 3.2 oz (37.739 kg)  01/08/14 89 lb (40.37 kg)  06/04/13 84 lb 3.5 oz (38.2 kg)  04/01/13 84 lb 4.8 oz (38.238 kg)  03/11/13 88 lb 8 oz (40.143 kg)  02/09/13 92 lb 9.5 oz (42 kg)  07/14/11 88 lb (39.917 kg)  02/25/11 203 lb 11.3 oz (92.4 kg)  12/02/10 145 lb 3 oz (65.857 kg)    BMI:  Body mass index is 15.32 kg/(m^2).  Estimated Nutritional Needs:  Kcal:  1220-1420  Protein:  60 grams  Fluid:  2.2 L/day  Skin:  Reviewed, no issues  Diet Order:  Diet clear liquid Room service appropriate?: Yes; Fluid consistency:: Thin  EDUCATION NEEDS:  No education needs identified at this time  No intake or output data in the 24 hours ending 07/04/14 1033  Last BM:  PTA     Jarome Matin, RD, LDN Inpatient Clinical Dietitian Pager # [phone] After hours/weekend pager # [phone]

## 2014-07-04 NOTE — Progress Notes (Signed)
Clinical Social Work  Patient was discussed during progression meeting and substance abuse referral received. RN reports patient is actively withdrawing today. CSW will follow up tomorrow to complete full assessment.  [place], [place] [phone]

## 2014-07-05 LAB — CBC
HEMATOCRIT: 32.1 % — AB (ref 36.0–46.0)
Hemoglobin: 10.7 g/dL — ABNORMAL LOW (ref 12.0–15.0)
MCH: 32 pg (ref 26.0–34.0)
MCHC: 33.3 g/dL (ref 30.0–36.0)
MCV: 96.1 fL (ref 78.0–100.0)
PLATELETS: 114 10*3/uL — AB (ref 150–400)
RBC: 3.34 MIL/uL — AB (ref 3.87–5.11)
RDW: 14.2 % (ref 11.5–15.5)
WBC: 4.1 10*3/uL (ref 4.0–10.5)

## 2014-07-05 LAB — BASIC METABOLIC PANEL
ANION GAP: 7 (ref 5–15)
BUN: 6 mg/dL (ref 6–20)
CALCIUM: 8.6 mg/dL — AB (ref 8.9–10.3)
CO2: 20 mmol/L — ABNORMAL LOW (ref 22–32)
CREATININE: 0.74 mg/dL (ref 0.44–1.00)
Chloride: 110 mmol/L (ref 101–111)
GFR calc non Af Amer: >60 mL/min (ref >60)
Glucose, Bld: 71 mg/dL (ref 65–99)
Potassium: 4.2 mmol/L (ref 3.5–5.1)
Sodium: 137 mmol/L (ref 135–145)

## 2014-07-05 LAB — LIPASE, BLOOD: LIPASE: 33 U/L (ref 22–51)

## 2014-07-05 MED ORDER — FOLIC ACID 1 MG PO TABS: 1.0000 mg | ORAL_TABLET | Freq: Every day | ORAL | Status: DC

## 2014-07-05 MED ORDER — THIAMINE HCL 100 MG PO TABS: 100.0000 mg | ORAL_TABLET | Freq: Every day | ORAL | Status: DC

## 2014-07-05 MED ORDER — NICOTINE 21 MG/24HR TD PT24: 21.0000 mg | MEDICATED_PATCH | Freq: Every day | TRANSDERMAL | Status: DC

## 2014-07-05 MED ORDER — ADULT MULTIVITAMIN W/MINERALS CH: 1.0000 | ORAL_TABLET | Freq: Every day | ORAL | Status: DC

## 2014-07-05 NOTE — Discharge Instructions (Signed)
Acute Pancreatitis Acute pancreatitis is a disease in which the pancreas becomes suddenly inflamed. The pancreas is a large gland located behind your stomach. The pancreas produces enzymes that help digest food. The pancreas also releases the hormones glucagon and insulin that help regulate blood sugar. Damage to the pancreas occurs when the digestive enzymes from the pancreas are activated and begin attacking the pancreas before being released into the intestine. Most acute attacks last a couple of days and can cause serious complications. Some people become dehydrated and develop low blood pressure. In severe cases, bleeding into the pancreas can lead to shock and can be life-threatening. The lungs, heart, and kidneys may fail. CAUSES  Pancreatitis can happen to anyone. In some cases, the cause is unknown. Most cases are caused by:  Alcohol abuse.  Gallstones. Other less common causes are:  Certain medicines.  Exposure to certain chemicals.  Infection.  Damage caused by an accident (trauma).  Abdominal surgery. SYMPTOMS   Pain in the upper abdomen that may radiate to the back.  Tenderness and swelling of the abdomen.  Nausea and vomiting. DIAGNOSIS  Your caregiver will perform a physical exam. Blood and stool tests may be done to confirm the diagnosis. Imaging tests may also be done, such as X-rays, CT scans, or an ultrasound of the abdomen. TREATMENT  Treatment usually requires a stay in the hospital. Treatment may include:  Pain medicine.  Fluid replacement through an intravenous line (IV).  Placing a tube in the stomach to remove stomach contents and control vomiting.  Not eating for 3 or 4 days. This gives your pancreas a rest, because enzymes are not being produced that can cause further damage.  Antibiotic medicines if your condition is caused by an infection.  Surgery of the pancreas or gallbladder. HOME CARE INSTRUCTIONS   Follow the diet advised by your  caregiver. This may involve avoiding alcohol and decreasing the amount of fat in your diet.  Eat smaller, more frequent meals. This reduces the amount of digestive juices the pancreas produces.  Drink enough fluids to keep your urine clear or pale yellow.  Only take over-the-counter or prescription medicines as directed by your caregiver.  Avoid drinking alcohol if it caused your condition.  Do not smoke.  Get plenty of rest.  Check your blood sugar at home as directed by your caregiver.  Keep all follow-up appointments as directed by your caregiver. SEEK MEDICAL CARE IF:   You do not recover as quickly as expected.  You develop new or worsening symptoms.  You have persistent pain, weakness, or nausea.  You recover and then have another episode of pain. SEEK IMMEDIATE MEDICAL CARE IF:   You are unable to eat or keep fluids down.  Your pain becomes severe.  You have a fever or persistent symptoms for more than 2 to 3 days.  You have a fever and your symptoms suddenly get worse.  Your skin or the white part of your eyes turn yellow (jaundice).  You develop vomiting.  You feel dizzy, or you faint.  Your blood sugar is high (over 300 mg/dL). MAKE SURE YOU:   Understand these instructions.  Will watch your condition.  Will get help right away if you are not doing well or get worse. Document Released: 12/21/2004 Document Revised: 06/22/2011 Document Reviewed: 04/01/2011 [facility] Patient Information 2015 [place], [place]. This information is not intended to replace advice given to you by your health care provider. Make sure you discuss any questions you have  with your health care provider.  You were cared for by a hospitalist during your hospital stay. If you have any questions about your discharge medications or the care you received while you were in the hospital after you are discharged, you can call the unit and asked to speak with the hospitalist on call if the  hospitalist that took care of you is not available. Once you are discharged, your primary care physician will handle any further medical issues. Please note that NO REFILLS for any discharge medications will be authorized once you are discharged, as it is imperative that you return to your primary care physician (or establish a relationship with a primary care physician if you do not have one) for your aftercare needs so that they can reassess your need for medications and monitor your lab values.

## 2014-07-05 NOTE — Progress Notes (Signed)
Patient discharged.  Leaving with one prescription and three to be picked up at pharmacy. Room air.

## 2014-07-05 NOTE — Discharge Summary (Signed)
Physician Discharge Summary  [name]:[id number] DOB: 05/30/1950 DOA: 07/03/2014  PCP: Janice Hey, MD  Admit date: 07/03/2014 Discharge date: 07/05/2014  Time spent: 45 minutes  Recommendations for Outpatient Follow-up:  Patient will be discharged to home.  Patient will need to follow up with primary care provider within one week of discharge.  Patient should continue medications as prescribed.  Patient should follow a soft diet and advance to heart healthy as tolerated. Patient strongly advised to abstain from alcohol and tobacco use.  Discharge Diagnoses:  Acute pancreatitis Abdominal pain with nausea Hypertension next and alcohol abuse Nicotine abuse Severe malnutrition Chronic pain and anxiety COPD Normocytic anemia  Discharge Condition: Stable  Diet recommendation: Soft, advance to heart healthy as tolerated  Filed Weights   07/03/14 1750 07/04/14 0552 07/05/14 0449  Weight: 39.009 kg (86 lb) 40.5 kg (89 lb 4.6 oz) 40.8 kg (89 lb 15.2 oz)    History of present illness:  on 07/03/2014  [name] is a 64 y.o. female with a history of pancreatitis, substance abuse, COPD represented to the emergency department with complaints of abdominal pain, nausea and vomiting. Patient states that her pain started proximally 3 days ago. She denies any alcohol use at that time. Patient denies any sick contacts at that time. She does state that yesterday she did have a beer. She states that sometimes her pancreatitis is worse when she has our units with her husband. Patient states she has not been eating well and has poor appetite due to the continued nausea and vomiting and abdominal pain. She states eating makes her pain worse. In the emergency department, patient was noted to have a lipase of over 900. [facility] was called for admission.  Hospital Course:  Acute pancreatitis  -Patient will be admitted to medical floor -Lipase 954 upon admission. Trending downward, currently  33 -Abdominal x-ray: No acute findings -Patient currently afebrile, no luekocytosis  -Diet advanced, and patient tolerated it well -Patient counseled on the importance of abstaining from alcohol use  Abdominal pain with nausea  -secondary to Acute pancreatitis -Plan and treatment as above -States her nausea has resolved, and wants to eat  Hypertension -Lisinopril held, but may resume at discharge  Alcohol abuse -Patient counseled on the need for cessation -Alcohol level <5 -Continue CIWA protocol  Nicotine abuse -Patient counseled regarding cessation -Continue nicotine patch  Severe Malnutrition -Continue nutritional supplements -Nutrition consulted and appreciated  Chronic pain and anxiety -Supposedly secondary to pancreatitis and back -Patient receives Dilaudid 4 mg every 6 hours for chronic pain along with Ativan 1 mg every 6 hours from her primary care physician -Would strongly recommend patient's pain regimen be tapered by her PCP or pain management physician   COPD -Patient states she uses an albuterol inhaler at home -Stable  Normocytic Anemia -Baseline Hb around 11 -Currently hb 10.7, improved from 9.6 with no intervention -drop likely dilutional  Procedures: None  Consultations: None  Discharge Exam: Filed Vitals:   07/05/14 0449  BP: 147/82  Pulse: 82  Temp: 97.7 F (36.5 C)  Resp: 18   Exam  General: Well developed, thin, no distress  HEENT: NCAT, mucous membranes moist.   Cardiovascular: S1 S2 auscultated, RRR, no murmurs  Respiratory: Clear to auscultation   Abdomen: Soft, nontender, nondistended, + bowel sounds  Extremities: warm dry without cyanosis clubbing or edema  Neuro: AAOx3, nonfocal  Psych: Anxious, but appropriate  Discharge Instructions      Discharge Instructions    Discharge  instructions    Complete by:  As directed   Patient will be discharged to home.  Patient will need to follow up with primary care  provider within one week of discharge.  Patient should continue medications as prescribed.  Patient should follow a soft diet and advance to heart healthy as tolerated. Patient strongly advised to abstain from alcohol and tobacco use.            Medication List    STOP taking these medications        metoCLOPramide 10 MG tablet  Commonly known as:  REGLAN      TAKE these medications        albuterol 108 (90 BASE) MCG/ACT inhaler  Commonly known as:  PROVENTIL HFA;VENTOLIN HFA  Inhale 2 puffs into the lungs every 6 (six) hours as needed for wheezing or shortness of breath.     folic acid 1 MG tablet  Commonly known as:  FOLVITE  Take 1 tablet (1 mg total) by mouth daily.     HYDROmorphone 4 MG tablet  Commonly known as:  DILAUDID  Take 1 tablet (4 mg total) by mouth every 6 (six) hours as needed for severe pain.     lisinopril 10 MG tablet  Commonly known as:  PRINIVIL,ZESTRIL  Take 1 tablet (10 mg total) by mouth daily.     LORazepam 1 MG tablet  Commonly known as:  ATIVAN  Take 1 tablet (1 mg total) by mouth every 6 (six) hours as needed. For anxiety.     multivitamin with minerals Tabs tablet  Take 1 tablet by mouth daily.     nicotine 21 mg/24hr patch  Commonly known as:  NICODERM CQ - dosed in mg/24 hours  Place 1 patch (21 mg total) onto the skin daily.     ondansetron 4 MG tablet  Commonly known as:  ZOFRAN  Take 4 mg by mouth 3 (three) times daily as needed for nausea or vomiting.     thiamine 100 MG tablet  Take 1 tablet (100 mg total) by mouth daily.       Allergies  Allergen Reactions  . Aspirin Other (See Comments)    bleeding  . Chlorpromazine Hcl Other (See Comments)    Muscle spasms  . Codeine Nausea Only   Follow-up Information    Follow up with Janice Hey, MD. Schedule an appointment as soon as possible for a visit in 1 week.   Specialty:  Family Medicine   Why:  Hospital follow up, pain management   Contact information:   [place] [address] [phone]        The results of significant diagnostics from this hospitalization (including imaging, microbiology, ancillary and laboratory) are listed below for reference.    Significant Diagnostic Studies: Dg Abd Acute W/chest  07/03/2014   CLINICAL DATA:  Nausea, vomiting, diarrhea and constipation for 3 days.  EXAM: DG ABDOMEN ACUTE W/ 1V CHEST  COMPARISON:  Chest radiograph 06/01/2013.  FINDINGS: Frontal view of the chest shows midline trachea and normal heart size. Lungs are hyperinflated but clear. No pleural fluid.  Two views of the abdomen show a normal bowel gas pattern. No small bowel dilatation. Scattered gas and stool in the colon. No unexpected radiopaque calculi. Atherosclerotic calcification of the arterial vasculature.  IMPRESSION: No acute findings.   Electronically Signed   By: Lorin Picket M.D.   On: 07/03/2014 14:56    Microbiology: No results found for  this or any previous visit (from the past 240 hour(s)).   Labs: Basic Metabolic Panel:  Recent Labs Lab 07/03/14 1330 07/03/14 1845 07/04/14 0520 07/05/14 0539  NA 131*  --  134* 137  K 4.3  --  4.5 4.2  CL 97*  --  106 110  CO2 20*  --  18* 20*  GLUCOSE 69  --  66 71  BUN 11  --  10 6  CREATININE 0.77 0.83 0.81 0.74  CALCIUM 9.1  --  8.8* 8.6*   Liver Function Tests:  Recent Labs Lab 07/03/14 1330  AST 47*  ALT 27  ALKPHOS 99  BILITOT 1.1  PROT 6.9  ALBUMIN 4.0    Recent Labs Lab 07/03/14 1330 07/03/14 1845 07/05/14 0539  LIPASE 954* 335* 33   No results for input(s): AMMONIA in the last 168 hours. CBC:  Recent Labs Lab 07/03/14 1330 07/03/14 1845 07/04/14 0520 07/05/14 0539  WBC 7.0 6.2 3.6* 4.1  NEUTROABS 5.1  --   --   --   HGB 11.8* 11.6* 9.6* 10.7*  HCT 34.6* 33.7* 27.4* 32.1*  MCV 91.8 93.4 91.9 96.1  PLT 169 144* PLATELET CLUMPS NOTED ON SMEAR, UNABLE TO ESTIMATE 114*   Cardiac Enzymes: No results for input(s): CKTOTAL,  CKMB, CKMBINDEX, TROPONINI in the last 168 hours. BNP: BNP (last 3 results) No results for input(s): BNP in the last 8760 hours.  ProBNP (last 3 results) No results for input(s): PROBNP in the last 8760 hours.  CBG: No results for input(s): GLUCAP in the last 168 hours.     SignedCristal Ford  [facility] 07/05/2014, 11:20 AM

## 2014-07-16 ENCOUNTER — Other Ambulatory Visit: Payer: Self-pay | Admitting: Internal Medicine

## 2014-07-16 DIAGNOSIS — R0600 Dyspnea, unspecified: Secondary | ICD-10-CM

## 2014-07-17 ENCOUNTER — Ambulatory Visit (INDEPENDENT_AMBULATORY_CARE_PROVIDER_SITE_OTHER): Payer: Medicaid Other | Admitting: Adult Health

## 2014-07-17 ENCOUNTER — Encounter: Payer: Self-pay | Admitting: Adult Health

## 2014-07-17 ENCOUNTER — Ambulatory Visit (INDEPENDENT_AMBULATORY_CARE_PROVIDER_SITE_OTHER): Payer: Medicaid Other | Admitting: Internal Medicine

## 2014-07-17 VITALS — BP 102/88 | HR 99 | Temp 97.7°F | Ht 64.0 in | Wt 84.0 lb

## 2014-07-17 DIAGNOSIS — J449 Chronic obstructive pulmonary disease, unspecified: Secondary | ICD-10-CM | POA: Insufficient documentation

## 2014-07-17 DIAGNOSIS — R0600 Dyspnea, unspecified: Secondary | ICD-10-CM | POA: Diagnosis not present

## 2014-07-17 DIAGNOSIS — Z720 Tobacco use: Secondary | ICD-10-CM

## 2014-07-17 LAB — PULMONARY FUNCTION TEST
DL/VA % PRED: 71 %
DL/VA: 3.52 ml/min/mmHg/L
DLCO UNC: 12.69 ml/min/mmHg
DLCO unc % pred: 49 %
FEF 25-75 PRE: 0.33 L/s
FEF 25-75 Post: 0.51 L/sec
FEF2575-%CHANGE-POST: 54 %
FEF2575-%PRED-POST: 21 %
FEF2575-%Pred-Pre: 13 %
FEV1-%CHANGE-POST: 25 %
FEV1-%PRED-POST: 36 %
FEV1-%PRED-PRE: 29 %
FEV1-POST: 0.95 L
FEV1-PRE: 0.76 L
FEV1FVC-%Change-Post: 14 %
FEV1FVC-%Pred-Pre: 54 %
FEV6-%Change-Post: 13 %
FEV6-%PRED-POST: 58 %
FEV6-%PRED-PRE: 51 %
FEV6-PRE: 1.69 L
FEV6-Post: 1.91 L
FEV6FVC-%CHANGE-POST: 3 %
FEV6FVC-%Pred-Post: 102 %
FEV6FVC-%Pred-Pre: 99 %
FVC-%Change-Post: 9 %
FVC-%PRED-POST: 57 %
FVC-%PRED-PRE: 52 %
FVC-POST: 1.95 L
FVC-Pre: 1.78 L
POST FEV6/FVC RATIO: 98 %
PRE FEV1/FVC RATIO: 43 %
Post FEV1/FVC ratio: 49 %
Pre FEV6/FVC Ratio: 95 %

## 2014-07-17 MED ORDER — TIOTROPIUM BROMIDE MONOHYDRATE 18 MCG IN CAPS: 18.0000 ug | ORAL_CAPSULE | Freq: Every day | RESPIRATORY_TRACT | Status: DC

## 2014-07-17 MED ORDER — MOMETASONE FURO-FORMOTEROL FUM 200-5 MCG/ACT IN AERO
2.0000 | INHALATION_SPRAY | Freq: Two times a day (BID) | RESPIRATORY_TRACT | Status: DC
Start: 2014-07-17 — End: 2014-09-13

## 2014-07-17 NOTE — Assessment & Plan Note (Signed)
GOLD 4 COPD  PFT 07/17/2014 FEV1 0.76 L, 29%, ratio 43, FVC 52, DLCO 49%, positive broncho-dilator response Encouraged on smoking cessation Discussed Prevnar vaccine on return Discussed pulmonary rehabilitation , however, is unable to drive.  Plan  Restart Dulera 2 puffs. Twice daily   Begin Spiriva 1 puff daily  Work on not smoking .  Follow up with Dr. Lake Bells in 6-8 weeks and As needed   Please contact office for sooner follow up if symptoms do not improve or worsen or seek emergency care

## 2014-07-17 NOTE — Patient Instructions (Addendum)
Restart Dulera 2 puffs. Twice daily   Begin Spiriva 1 puff daily  Work on not smoking .  Follow up with Dr. Lake Bells in 6-8 weeks and As needed   Please contact office for sooner follow up if symptoms do not improve or worsen or seek emergency care

## 2014-07-17 NOTE — Assessment & Plan Note (Signed)
Smoking cessation  

## 2014-07-17 NOTE — Progress Notes (Signed)
   Subjective:    Patient ID: [name], female    DOB: April 13, 1950, 64 y.o.   MRN: [id number]  HPI 64 year old female, heavy smoker, with COPD Patient has significant alcohol abuse and pancreatitis   PFT 07/17/2014 FEV1 0.76 L, 29%, ratio 43, FVC 52, DLCO 49%, positive broncho-dilator response  07/17/2014 follow-up COPD Patient returns for a follow-up visit. Patient underwent a PFT. This morning that showed very severe COPD with an FEV1 at 29%, ratio 43 and a low diffusing capacity at 49%. There was a positive broncho-dilator response. She is a very heavy smoker with 2-3 packs daily We discussed smoking cessation.  She's had multiple hospitalizations for pancreatitis and chronic pain. We discussed her pulmonary function test results. She was started on [facility] and feels that it did help but ran out of her sample. We discussed restarting Dulera and adding in Spiriva. We talked about pulmonary rehabilitation. However, transportation may be an issue as she does not drive. Pneumovax is up-to-date. She denies any chest pain, hemoptysis, orthopnea, PND or leg swelling. Patient's weight is 84 pounds, says that she has had a low weight for many years. We discussed adding in boost .    Review of Systems    Constitutional:   No  weight loss, night sweats,  Fevers, chills, + fatigue, or  lassitude.  HEENT:   No headaches,  Difficulty swallowing,  Tooth/dental problems, or  Sore throat,                No sneezing, itching, ear ache, nasal congestion, post nasal drip,   CV:  No chest pain,  Orthopnea, PND, swelling in lower extremities, anasarca, dizziness, palpitations, syncope.   GI  No heartburn, indigestion, abdominal pain, nausea, vomiting, diarrhea, change in bowel habits, loss of appetite, bloody stools.   Resp:    No chest wall deformity  Skin: no rash or lesions.  GU: no dysuria, change in color of urine, no urgency or frequency.  No flank pain, no hematuria   MS:  No joint  pain or swelling.  No decreased range of motion. +o back pain.  Psych:   +anxiety .      Objective:   Physical Exam GEN: A/Ox3; pleasant , NAD, thin and frail , cachexic   HEENT:  [place]/AT,  EACs-clear, TMs-wnl, NOSE-clear, THROAT-clear, no lesions, no postnasal drip or exudate noted.   NECK:  Supple w/ fair ROM; no JVD; normal carotid impulses w/o bruits; no thyromegaly or nodules palpated; no lymphadenopathy.  RESP  Decreased BS in bases, w/ no wheezing no accessory muscle use, no dullness to percussion  CARD:  RRR, no m/r/g  , no peripheral edema, pulses intact, no cyanosis or clubbing.  GI:   Soft & nt; nml bowel sounds; no organomegaly or masses detected.  Musco: Warm bil, no deformities or joint swelling noted.   Neuro: alert, no focal deficits noted.   Anxious  Skin: Warm, no lesions or rashes ]        Assessment & Plan:

## 2014-07-17 NOTE — Progress Notes (Signed)
PFT done today. 

## 2014-07-22 ENCOUNTER — Encounter: Payer: Self-pay | Admitting: Internal Medicine

## 2014-07-23 NOTE — Progress Notes (Signed)
I agree with this plan of care 

## 2014-08-14 ENCOUNTER — Emergency Department (HOSPITAL_COMMUNITY)
Admission: EM | Admit: 2014-08-14 | Discharge: 2014-08-14 | Disposition: A | Discharge status: Home / Self Care | Payer: Medicaid Other | Attending: Emergency Medicine | Admitting: Emergency Medicine

## 2014-08-14 ENCOUNTER — Encounter (HOSPITAL_COMMUNITY): Payer: Self-pay | Admitting: *Deleted

## 2014-08-14 DIAGNOSIS — R1013 Epigastric pain: Secondary | ICD-10-CM | POA: Insufficient documentation

## 2014-08-14 DIAGNOSIS — Z720 Tobacco use: Secondary | ICD-10-CM | POA: Insufficient documentation

## 2014-08-14 DIAGNOSIS — K2920 Alcoholic gastritis without bleeding: Secondary | ICD-10-CM | POA: Diagnosis not present

## 2014-08-14 DIAGNOSIS — J449 Chronic obstructive pulmonary disease, unspecified: Secondary | ICD-10-CM | POA: Insufficient documentation

## 2014-08-14 DIAGNOSIS — Z85528 Personal history of other malignant neoplasm of kidney: Secondary | ICD-10-CM | POA: Insufficient documentation

## 2014-08-14 DIAGNOSIS — Z79899 Other long term (current) drug therapy: Secondary | ICD-10-CM | POA: Insufficient documentation

## 2014-08-14 LAB — CBC WITH DIFFERENTIAL/PLATELET
BASOS ABS: 0 10*3/uL (ref 0.0–0.1)
Basophils Relative: 0 % (ref 0–1)
EOS ABS: 0 10*3/uL (ref 0.0–0.7)
Eosinophils Relative: 0 % (ref 0–5)
HCT: 36.7 % (ref 36.0–46.0)
Hemoglobin: 12.4 g/dL (ref 12.0–15.0)
Lymphocytes Relative: 7 % — ABNORMAL LOW (ref 12–46)
Lymphs Abs: 0.6 10*3/uL — ABNORMAL LOW (ref 0.7–4.0)
MCH: 31.4 pg (ref 26.0–34.0)
MCHC: 33.8 g/dL (ref 30.0–36.0)
MCV: 92.9 fL (ref 78.0–100.0)
MONO ABS: 0.4 10*3/uL (ref 0.1–1.0)
Monocytes Relative: 5 % (ref 3–12)
Neutro Abs: 7.3 10*3/uL (ref 1.7–7.7)
Neutrophils Relative %: 88 % — ABNORMAL HIGH (ref 43–77)
PLATELETS: 167 10*3/uL (ref 150–400)
RBC: 3.95 MIL/uL (ref 3.87–5.11)
RDW: 13.4 % (ref 11.5–15.5)
WBC: 8.3 10*3/uL (ref 4.0–10.5)

## 2014-08-14 LAB — MAGNESIUM: Magnesium: 1.9 mg/dL (ref 1.7–2.4)

## 2014-08-14 LAB — COMPREHENSIVE METABOLIC PANEL
ALT: 19 U/L (ref 14–54)
AST: 33 U/L (ref 15–41)
Albumin: 4.4 g/dL (ref 3.5–5.0)
Alkaline Phosphatase: 82 U/L (ref 38–126)
Anion gap: 11 (ref 5–15)
BUN: 11 mg/dL (ref 6–20)
CO2: 24 mmol/L (ref 22–32)
Calcium: 9.7 mg/dL (ref 8.9–10.3)
Chloride: 100 mmol/L — ABNORMAL LOW (ref 101–111)
Creatinine, Ser: 0.77 mg/dL (ref 0.44–1.00)
GFR calc Af Amer: >60 mL/min (ref >60)
GFR calc non Af Amer: >60 mL/min (ref >60)
Glucose, Bld: 109 mg/dL — ABNORMAL HIGH (ref 65–99)
Potassium: 4 mmol/L (ref 3.5–5.1)
Sodium: 135 mmol/L (ref 135–145)
Total Bilirubin: 0.5 mg/dL (ref 0.3–1.2)
Total Protein: 7.8 g/dL (ref 6.5–8.1)

## 2014-08-14 LAB — TROPONIN I: Troponin I: <0.03 ng/mL (ref <0.031)

## 2014-08-14 LAB — URINE MICROSCOPIC-ADD ON

## 2014-08-14 LAB — URINALYSIS, ROUTINE W REFLEX MICROSCOPIC
Bilirubin Urine: NEGATIVE
Glucose, UA: NEGATIVE mg/dL
Ketones, ur: NEGATIVE mg/dL
Leukocytes, UA: NEGATIVE
Nitrite: NEGATIVE
Protein, ur: NEGATIVE mg/dL
Specific Gravity, Urine: 1.012 (ref 1.005–1.030)
Urobilinogen, UA: 0.2 mg/dL (ref 0.0–1.0)
pH: 7 (ref 5.0–8.0)

## 2014-08-14 LAB — LIPASE, BLOOD: Lipase: 17 U/L — ABNORMAL LOW (ref 22–51)

## 2014-08-14 MED ORDER — SODIUM CHLORIDE 0.9 % IV BOLUS (SEPSIS)
1000.0000 mL | Freq: Once | INTRAVENOUS | Status: AC
  Administered 2014-08-14: 1000 mL via INTRAVENOUS

## 2014-08-14 MED ORDER — ONDANSETRON HCL 4 MG/2ML IJ SOLN
4.0000 mg | Freq: Once | INTRAMUSCULAR | Status: AC
  Administered 2014-08-14: 4 mg via INTRAMUSCULAR

## 2014-08-14 MED ORDER — HYDROMORPHONE HCL 1 MG/ML IJ SOLN
1.0000 mg | Freq: Once | INTRAMUSCULAR | Status: AC
  Administered 2014-08-14: 1 mg via INTRAVENOUS
  Filled 2014-08-14: qty 1

## 2014-08-14 MED ORDER — METOCLOPRAMIDE HCL 5 MG/ML IJ SOLN
10.0000 mg | Freq: Once | INTRAMUSCULAR | Status: AC
  Administered 2014-08-14: 10 mg via INTRAVENOUS
  Filled 2014-08-14: qty 2

## 2014-08-14 MED ORDER — GI COCKTAIL ~~LOC~~
30.0000 mL | Freq: Once | ORAL | Status: AC
  Administered 2014-08-14: 30 mL via ORAL
  Filled 2014-08-14: qty 30

## 2014-08-14 MED ORDER — MORPHINE SULFATE 4 MG/ML IJ SOLN
4.0000 mg | Freq: Once | INTRAMUSCULAR | Status: AC
  Administered 2014-08-14: 4 mg via INTRAMUSCULAR
  Filled 2014-08-14: qty 1

## 2014-08-14 MED ORDER — ONDANSETRON HCL 4 MG/2ML IJ SOLN
4.0000 mg | Freq: Once | INTRAMUSCULAR | Status: DC
  Filled 2014-08-14: qty 2

## 2014-08-14 NOTE — ED Provider Notes (Signed)
CSN: [id number]     Arrival date & time 08/14/14  9485 History   First MD Initiated Contact with Patient 08/14/14 [phone]     Chief Complaint  Patient presents with  . Abdominal Pain     (Consider location/radiation/quality/duration/timing/severity/associated sxs/prior Treatment) HPI 64 year old female who presents with abdominal pain. She has a history of partial gastrectomy and partial nephrectomy. She has a history of substance abuse including alcohol dependence and recurrent pancreatitis. Reports onset of epigastric abdominal pain after drinking a beer yesterday evening. Pain is consistent with pancreatitis. This is associated with nausea and vomiting. She has not had any diarrhea, abdominal distention, melena, hematochezia, or hematemesis. Denies that pain is postprandial in nature. Denies any dysuria, urinary frequency. Past Medical History  Diagnosis Date  . Pancreatitis   . COPD (chronic obstructive pulmonary disease)   . Substance abuse   . Cancer     renal ca  . Seizures   . Pancreatitis    Past Surgical History  Procedure Laterality Date  . Partial gastrectomy    . Kidney surgery      removed cancerous lesions   Family History  Problem Relation Age of Onset  . CAD Mother   . Alcoholism Father   . COPD Father   . COPD Father    Social History  Substance Use Topics  . Smoking status: Current Every Day Smoker -- 1.00 packs/day for 30 years    Types: Cigarettes  . Smokeless tobacco: Never Used  . Alcohol Use: 0.0 oz/week    0 Standard drinks or equivalent per week   OB History    No data available     Review of Systems 10/14 systems reviewed and are negative other than those stated in the HPI  Allergies  Aspirin; Chlorpromazine hcl; and Codeine  Home Medications   Prior to Admission medications   Medication Sig Start Date End Date Taking? Authorizing Provider  albuterol (PROVENTIL HFA;VENTOLIN HFA) 108 (90 BASE) MCG/ACT inhaler Inhale 2 puffs into the  lungs every 6 (six) hours as needed for wheezing or shortness of breath. 09/23/13  Yes Sherwood Gambler, MD  HYDROmorphone (DILAUDID) 4 MG tablet Take 1 tablet (4 mg total) by mouth every 6 (six) hours as needed for severe pain. 09/23/13  Yes Sherwood Gambler, MD  lisinopril (PRINIVIL,ZESTRIL) 10 MG tablet Take 1 tablet (10 mg total) by mouth daily. 06/06/13  Yes Costin Karlyne Greenspan, MD  LORazepam (ATIVAN) 1 MG tablet Take 1 tablet (1 mg total) by mouth every 6 (six) hours as needed. For anxiety. 04/01/13  Yes Estela Leonie Green, MD  mometasone-formoterol [facility]) 200-5 MCG/ACT AERO Inhale 2 puffs into the lungs 2 (two) times daily. 07/17/14  Yes Tammy S Parrett, NP  ondansetron (ZOFRAN) 4 MG tablet Take 4 mg by mouth 3 (three) times daily as needed for nausea or vomiting.  05/09/14  Yes Historical Provider, MD  tiotropium (SPIRIVA HANDIHALER) 18 MCG inhalation capsule Place 1 capsule (18 mcg total) into inhaler and inhale daily. 07/17/14  Yes Tammy S Parrett, NP  nicotine (NICODERM CQ - DOSED IN MG/24 HOURS) 21 mg/24hr patch Place 1 patch (21 mg total) onto the skin daily. Patient not taking: Reported on 07/17/2014 07/05/14   Maryann Mikhail, DO   BP 143/94 mmHg  Pulse 103  Temp(Src) 98.4 F (36.9 C) (Oral)  Resp 22  SpO2 100% Physical Exam Physical Exam  Nursing note and vitals reviewed. Constitutional: Well developed, cachectic appearing, non-toxic, and in no acute distress Head: Normocephalic  and atraumatic.  Mouth/Throat: Oropharynx is clear, but dry.  Neck: Normal range of motion. Neck supple.  Cardiovascular: Tachycardic rate and regular rhythm.  < 3 seconds capillary refill. No LE edema. Pulmonary/Chest: Effort normal and breath sounds normal.  Abdominal: Soft. There is tenderness to palpation over epigastrium. No Murphy's sign. No tenderness at McBurney's point. There is no rebound and no guarding.  Musculoskeletal: Normal range of motion.  Neurological: Alert, no facial droop, fluent speech,  moves all extremities symmetrically Skin: Skin is warm and dry.  Psychiatric: Cooperative  ED Course  Procedures (including critical care time) Labs Review Labs Reviewed  CBC WITH DIFFERENTIAL/PLATELET - Abnormal; Notable for the following:    Neutrophils Relative % 88 (*)    Lymphocytes Relative 7 (*)    Lymphs Abs 0.6 (*)    All other components within normal limits  COMPREHENSIVE METABOLIC PANEL - Abnormal; Notable for the following:    Chloride 100 (*)    Glucose, Bld 109 (*)    All other components within normal limits  LIPASE, BLOOD - Abnormal; Notable for the following:    Lipase 17 (*)    All other components within normal limits  URINALYSIS, ROUTINE W REFLEX MICROSCOPIC (NOT AT [facility]) - Abnormal; Notable for the following:    Hgb urine dipstick MODERATE (*)    All other components within normal limits  TROPONIN I  MAGNESIUM  URINE MICROSCOPIC-ADD ON  TROPONIN I    Imaging Review No results found.   EKG Interpretation   Date/Time:  Wednesday August 14 2014 09:59:53 EDT Ventricular Rate:  111 PR Interval:  159 QRS Duration: 68 QT Interval:  345 QTC Calculation: 469 R Axis:   89 Text Interpretation:  Sinus tachycardia Atrial premature complex Biatrial  enlargement Borderline right axis deviation TWI in anterior leads  Confirmed by Anzel Kearse MD, Ermine Stebbins ([id number]) on 08/14/2014 10:48:13 AM      MDM   Final diagnoses:  Epigastric abdominal pain  Alcoholic gastritis    In short, this is a 64 year old female with history of partial gastrectomy, alcohol dependence, and recurrent pancreatitis who presents with epigastric abdominal pain after alcohol in take yesterday evening. Abdomen is soft and non-surgical, with focal epigastric pain. No RUQ tenderness suggestive of hepatobiliary pathology. Unremarkable CBC, CMP, and UA. Lipase normal, and not suggestive of pancreatitis. Recently admitted for pancreatitis 06/2014 with elevated lipase in 900s; thus, do not suspect  pancreatitis with normal Lipase. Initially given, IVF, dilaudid, and antiemetics. Given persistent pain, gave GI cocktail as felt consistent with possible acute alcoholic gastritis.   Given concern for referred pain suggestive of atypical CP and ACS, rule out performed, although suspicion is very low. EKG is not ischemic and troponin x 1 is negative. Repeat troponin is refused by patient as she reports feeling well and tolerated PO. She is felt adequately ruled out, and heart score 2 with low risk of MACE.   Patient continues to have benign abdomen. Given improvement in symptoms, she is felt appropriate for discharge home. Strict return and follow-up instructions reviewed. She expressed understanding of all discharge instructions and felt comfortable with the plan of care.     Forde Dandy, MD 08/14/14 [phone]

## 2014-08-14 NOTE — ED Notes (Signed)
PT REFUSED BLOOD WORK RN MADE AWARE

## 2014-08-14 NOTE — ED Notes (Signed)
Per EMS - patient comes from home where she lives with her husband with c/o mid abdominal pain and N/V.  Patient has hx of pancreatitis.  Patient takes Dilaudid 4 mg QID for pain and has gained no relief with that medication.  Patient received 250 mcg Fentanyl INH and IM en route with EMS.  Patient also received Zofran with some relief of nausea.  ECG unremarkable.

## 2014-08-14 NOTE — Discharge Instructions (Signed)
Return without fail for worsening symptoms, including fever, vomiting, worsening pain, or any other symptoms concerning to you.  Abdominal Pain Many things can cause abdominal pain. Usually, abdominal pain is not caused by a disease and will improve without treatment. It can often be observed and treated at home. Your health care provider will do a physical exam and possibly order blood tests and X-rays to help determine the seriousness of your pain. However, in many cases, more time must pass before a clear cause of the pain can be found. Before that point, your health care provider may not know if you need more testing or further treatment. HOME CARE INSTRUCTIONS  Monitor your abdominal pain for any changes. The following actions may help to alleviate any discomfort you are experiencing:  Only take over-the-counter or prescription medicines as directed by your health care provider.  Do not take laxatives unless directed to do so by your health care provider.  Try a clear liquid diet (broth, tea, or water) as directed by your health care provider. Slowly move to a bland diet as tolerated. SEEK MEDICAL CARE IF:  You have unexplained abdominal pain.  You have abdominal pain associated with nausea or diarrhea.  You have pain when you urinate or have a bowel movement.  You experience abdominal pain that wakes you in the night.  You have abdominal pain that is worsened or improved by eating food.  You have abdominal pain that is worsened with eating fatty foods.  You have a fever. SEEK IMMEDIATE MEDICAL CARE IF:   Your pain does not go away within 2 hours.  You keep throwing up (vomiting).  Your pain is felt only in portions of the abdomen, such as the right side or the left lower portion of the abdomen.  You pass bloody or black tarry stools. MAKE SURE YOU:  Understand these instructions.   Will watch your condition.   Will get help right away if you are not doing well or get  worse.  Document Released: 09/30/2004 Document Revised: 12/26/2012 Document Reviewed: 08/30/2012 [facility] Patient Information 2015 [place], [place]. This information is not intended to replace advice given to you by your health care provider. Make sure you discuss any questions you have with your health care provider.

## 2014-08-14 NOTE — ED Notes (Signed)
Patient refused repeat Troponin.  She wants to go home.

## 2014-08-14 NOTE — ED Notes (Signed)
Provider notified that the Pt would like pain and nausea medication.  Provider to see Pt.

## 2014-08-14 NOTE — ED Notes (Addendum)
Patient comes from home with c/o mid abdominal pain and N/V that began yesterday shortly after she had a beer.  Patient states that was her first use of alcohol in 9 months.  Patient states movement makes pain worse and nothing makes it better.  Patient has taken her Dilaudid at home to treat the pain with no relief.    On exam, patient's lung sounds are clear and coarse. Abdomen is soft and tender to palpation.  Bowel sounds present.  Heart sounds WNL, no rub, gallop or murmur heard.

## 2014-08-14 NOTE — ED Notes (Signed)
Bed: ZH46 Expected date:  Expected time:  Means of arrival:  Comments: EMS- 64yo F, abdominal pain, Hx of pancreatitis

## 2014-08-14 NOTE — ED Notes (Signed)
Patient unable to void at this time

## 2014-08-23 ENCOUNTER — Telehealth: Payer: Self-pay | Admitting: Pulmonary Disease

## 2014-08-23 MED ORDER — DOXYCYCLINE HYCLATE 100 MG PO TABS: 100.0000 mg | ORAL_TABLET | Freq: Two times a day (BID) | ORAL | Status: DC

## 2014-08-23 MED ORDER — PREDNISONE 10 MG PO TABS: ORAL_TABLET | ORAL | Status: DC

## 2014-08-23 NOTE — Telephone Encounter (Signed)
Patients cough has gotten worse.  She has had a sore throat x 2 days.  She cannot speak because she is very hoarse.  SOB, chest very tight.  Not taking any medication for cough other than her rescue inhaler.  She has high blood pressure, so she is afraid to take anything OTC.  Pharmacy: [facility]  Ronalee Belts (Husband)- Cell [phone]  Allergies  Allergen Reactions  . Aspirin Other (See Comments)    bleeding  . Chlorpromazine Hcl Other (See Comments)    Muscle spasms  . Codeine Nausea Only

## 2014-08-23 NOTE — Telephone Encounter (Signed)
Patients husband notified. Rx sent to pharmacy. Nothing further needed.

## 2014-08-23 NOTE — Telephone Encounter (Signed)
Prednisone taper: Take '40mg'$  po daily for 3 days, then take '30mg'$  po daily for 3 days, then take '20mg'$  po daily for two days, then take '10mg'$  po daily for 2 days Doxycycline '100mg'$  po bid x5 days Go to ER if worse

## 2014-09-02 ENCOUNTER — Ambulatory Visit (INDEPENDENT_AMBULATORY_CARE_PROVIDER_SITE_OTHER): Payer: Medicaid Other | Admitting: Pulmonary Disease

## 2014-09-02 ENCOUNTER — Telehealth (HOSPITAL_COMMUNITY): Payer: Self-pay | Admitting: *Deleted

## 2014-09-02 ENCOUNTER — Encounter: Payer: Self-pay | Admitting: Pulmonary Disease

## 2014-09-02 VITALS — BP 110/60 | HR 86 | Ht 64.0 in | Wt 85.6 lb

## 2014-09-02 DIAGNOSIS — Z23 Encounter for immunization: Secondary | ICD-10-CM

## 2014-09-02 DIAGNOSIS — Z720 Tobacco use: Secondary | ICD-10-CM

## 2014-09-02 DIAGNOSIS — J449 Chronic obstructive pulmonary disease, unspecified: Secondary | ICD-10-CM | POA: Diagnosis not present

## 2014-09-02 NOTE — Addendum Note (Signed)
Addended by: Inge Rise on: 09/02/2014 09:29 AM   Modules accepted: Orders

## 2014-09-02 NOTE — Assessment & Plan Note (Addendum)
Counseled at length to quit smoking. She is quite deconditioned which contributes to her shortness of breath. She has not had an exacerbation despite her very severe COPD. Overall her prognosis is poor considering her protein calorie malnutrition and poor airflow obstruction.  Plan: Encouraged at length to quit smoking Pulmonary rehabilitation referral Continue Dulera and Spiriva Prevnar vaccine today Follow-up 6 months

## 2014-09-02 NOTE — Progress Notes (Signed)
   Subjective:    Patient ID: [name], female    DOB: 1950/12/07, 64 y.o.   MRN: [id number]  Synopsis: Gold grade 4 COPD PFT 07/17/2014 FEV1 0.95 ((36%)  Ratio 49 p 25% resp to saba  DLCO 49% Still smoking as of 09/02/2014    HPI Chief Complaint  Patient presents with  . Follow-up    Pt reports breathing is slightly better w/ spiriva. C/o prod cough (white phlem), chest tx.    She thinks that the Spiriva capsules are really helping her.  No bronchitis or other infections since last visit. She has not been exercising since the last visit.   She continues to smoke.  She is using the [facility].   She is not exercising.  She really wants oxygen.   Past Medical History  Diagnosis Date  . Pancreatitis   . COPD (chronic obstructive pulmonary disease)   . Substance abuse   . Cancer     renal ca  . Seizures   . Pancreatitis       Review of Systems     Objective:   Physical Exam Filed Vitals:   09/02/14 0902  BP: 110/60  Pulse: 86  Height: '5\' 4"'$  (1.626 m)  Weight: 85 lb 9.6 oz (38.828 kg)  SpO2: 92%   RA  Gen: frail, then, chronically ill appearing HENT: OP clear, , neck supple PULM: Wheezing bilaterally, limited air movement CV: RRR, no mgr, trace edema GI: BS+, soft, nontender Derm: no cyanosis or rash Psyche: normal mood and affect         Assessment & Plan:  COPD GOLD III with reversible component  Counseled at length to quit smoking. She is quite deconditioned which contributes to her shortness of breath. She has not had an exacerbation despite her very severe COPD. Overall her prognosis is poor considering her protein calorie malnutrition and poor airflow obstruction.  Plan: Encouraged at length to quit smoking Pulmonary rehabilitation referral Continue Dulera and Spiriva Prevnar vaccine today Follow-up 6 months  Tobacco abuse Counseled at length to quit smoking.    Current outpatient prescriptions:  .  albuterol (PROVENTIL HFA;VENTOLIN  HFA) 108 (90 BASE) MCG/ACT inhaler, Inhale 2 puffs into the lungs every 6 (six) hours as needed for wheezing or shortness of breath., Disp: 1 Inhaler, Rfl: 1 .  HYDROmorphone (DILAUDID) 4 MG tablet, Take 1 tablet (4 mg total) by mouth every 6 (six) hours as needed for severe pain., Disp: 10 tablet, Rfl: 0 .  lisinopril (PRINIVIL,ZESTRIL) 10 MG tablet, Take 1 tablet (10 mg total) by mouth daily., Disp: 30 tablet, Rfl: 0 .  LORazepam (ATIVAN) 1 MG tablet, Take 1 tablet (1 mg total) by mouth every 6 (six) hours as needed. For anxiety., Disp: 10 tablet, Rfl: 0 .  mometasone-formoterol (DULERA) 200-5 MCG/ACT AERO, Inhale 2 puffs into the lungs 2 (two) times daily., Disp: 1 Inhaler, Rfl: 5 .  ondansetron (ZOFRAN) 4 MG tablet, Take 4 mg by mouth 3 (three) times daily as needed for nausea or vomiting. , Disp: , Rfl: 0 .  tiotropium (SPIRIVA HANDIHALER) 18 MCG inhalation capsule, Place 1 capsule (18 mcg total) into inhaler and inhale daily., Disp: 30 capsule, Rfl: 5

## 2014-09-02 NOTE — Patient Instructions (Addendum)
Stop smoking Keep taking Spiriva and Dulera We will refer you to pulmonary rehabilitation You can take dextromethorphan (Delsym) as needed for cough Get a flu shot in the fall

## 2014-09-02 NOTE — Assessment & Plan Note (Signed)
Counseled at length to quit smoking. 

## 2014-09-13 ENCOUNTER — Other Ambulatory Visit: Payer: Self-pay | Admitting: Adult Health

## 2014-11-16 ENCOUNTER — Emergency Department (HOSPITAL_COMMUNITY): Payer: Medicaid Other

## 2014-11-16 ENCOUNTER — Encounter (HOSPITAL_COMMUNITY): Payer: Self-pay

## 2014-11-16 ENCOUNTER — Emergency Department (HOSPITAL_COMMUNITY)
Admission: EM | Admit: 2014-11-16 | Discharge: 2014-11-16 | Disposition: A | Discharge status: Home / Self Care | Payer: Medicaid Other | Attending: Emergency Medicine | Admitting: Emergency Medicine

## 2014-11-16 DIAGNOSIS — K859 Acute pancreatitis without necrosis or infection, unspecified: Secondary | ICD-10-CM | POA: Insufficient documentation

## 2014-11-16 DIAGNOSIS — Z85528 Personal history of other malignant neoplasm of kidney: Secondary | ICD-10-CM | POA: Diagnosis not present

## 2014-11-16 DIAGNOSIS — Z8669 Personal history of other diseases of the nervous system and sense organs: Secondary | ICD-10-CM | POA: Insufficient documentation

## 2014-11-16 DIAGNOSIS — J449 Chronic obstructive pulmonary disease, unspecified: Secondary | ICD-10-CM | POA: Insufficient documentation

## 2014-11-16 DIAGNOSIS — R1010 Upper abdominal pain, unspecified: Secondary | ICD-10-CM | POA: Diagnosis present

## 2014-11-16 DIAGNOSIS — Z720 Tobacco use: Secondary | ICD-10-CM | POA: Insufficient documentation

## 2014-11-16 DIAGNOSIS — R000 Tachycardia, unspecified: Secondary | ICD-10-CM | POA: Insufficient documentation

## 2014-11-16 DIAGNOSIS — Z79899 Other long term (current) drug therapy: Secondary | ICD-10-CM | POA: Diagnosis not present

## 2014-11-16 LAB — COMPREHENSIVE METABOLIC PANEL
ALBUMIN: 4.2 g/dL (ref 3.5–5.0)
ALK PHOS: 74 U/L (ref 38–126)
ALT: 25 U/L (ref 14–54)
ANION GAP: 10 (ref 5–15)
AST: 27 U/L (ref 15–41)
BUN: 16 mg/dL (ref 6–20)
CO2: 21 mmol/L — AB (ref 22–32)
Calcium: 9.6 mg/dL (ref 8.9–10.3)
Chloride: 102 mmol/L (ref 101–111)
Creatinine, Ser: 0.78 mg/dL (ref 0.44–1.00)
GFR calc Af Amer: >60 mL/min (ref >60)
GFR calc non Af Amer: >60 mL/min (ref >60)
GLUCOSE: 115 mg/dL — AB (ref 65–99)
POTASSIUM: 3.7 mmol/L (ref 3.5–5.1)
SODIUM: 133 mmol/L — AB (ref 135–145)
Total Bilirubin: 0.7 mg/dL (ref 0.3–1.2)
Total Protein: 7.5 g/dL (ref 6.5–8.1)

## 2014-11-16 LAB — CBC WITH DIFFERENTIAL/PLATELET
BASOS ABS: 0 10*3/uL (ref 0.0–0.1)
BASOS PCT: 0 %
EOS ABS: 0.1 10*3/uL (ref 0.0–0.7)
Eosinophils Relative: 1 %
HCT: 43.4 % (ref 36.0–46.0)
HEMOGLOBIN: 14.7 g/dL (ref 12.0–15.0)
Lymphocytes Relative: 19 %
Lymphs Abs: 1.5 10*3/uL (ref 0.7–4.0)
MCH: 30.9 pg (ref 26.0–34.0)
MCHC: 33.9 g/dL (ref 30.0–36.0)
MCV: 91.4 fL (ref 78.0–100.0)
Monocytes Absolute: 0.7 10*3/uL (ref 0.1–1.0)
Monocytes Relative: 9 %
NEUTROS PCT: 71 %
Neutro Abs: 5.6 10*3/uL (ref 1.7–7.7)
Platelets: 183 10*3/uL (ref 150–400)
RBC: 4.75 MIL/uL (ref 3.87–5.11)
RDW: 14.6 % (ref 11.5–15.5)
WBC: 7.9 10*3/uL (ref 4.0–10.5)

## 2014-11-16 LAB — LIPASE, BLOOD: Lipase: 144 U/L — ABNORMAL HIGH (ref 11–51)

## 2014-11-16 LAB — I-STAT TROPONIN, ED: Troponin i, poc: 0.01 ng/mL (ref 0.00–0.08)

## 2014-11-16 MED ORDER — ONDANSETRON HCL 4 MG/2ML IJ SOLN
4.0000 mg | Freq: Once | INTRAMUSCULAR | Status: AC
  Administered 2014-11-16: 4 mg via INTRAVENOUS
  Filled 2014-11-16: qty 2

## 2014-11-16 MED ORDER — HYDROMORPHONE HCL 1 MG/ML IJ SOLN
1.0000 mg | Freq: Once | INTRAMUSCULAR | Status: AC
  Administered 2014-11-16: 1 mg via INTRAVENOUS
  Filled 2014-11-16: qty 1

## 2014-11-16 MED ORDER — PROMETHAZINE HCL 25 MG/ML IJ SOLN
25.0000 mg | Freq: Once | INTRAMUSCULAR | Status: AC
  Administered 2014-11-16: 25 mg via INTRAVENOUS
  Filled 2014-11-16: qty 1

## 2014-11-16 MED ORDER — IPRATROPIUM-ALBUTEROL 0.5-2.5 (3) MG/3ML IN SOLN
3.0000 mL | Freq: Once | RESPIRATORY_TRACT | Status: AC
  Administered 2014-11-16: 3 mL via RESPIRATORY_TRACT
  Filled 2014-11-16: qty 3

## 2014-11-16 MED ORDER — HYDROMORPHONE HCL 2 MG/ML IJ SOLN
2.0000 mg | Freq: Once | INTRAMUSCULAR | Status: AC
  Administered 2014-11-16: 2 mg via INTRAVENOUS
  Filled 2014-11-16: qty 1

## 2014-11-16 MED ORDER — SODIUM CHLORIDE 0.9 % IV BOLUS (SEPSIS)
1000.0000 mL | Freq: Once | INTRAVENOUS | Status: AC
  Administered 2014-11-16: 1000 mL via INTRAVENOUS

## 2014-11-16 MED ORDER — OXYCODONE-ACETAMINOPHEN 10-325 MG PO TABS: 1.0000 | ORAL_TABLET | ORAL | Status: DC | PRN

## 2014-11-16 MED ORDER — LORAZEPAM 2 MG/ML IJ SOLN
1.0000 mg | Freq: Once | INTRAMUSCULAR | Status: AC
  Administered 2014-11-16: 1 mg via INTRAVENOUS
  Filled 2014-11-16: qty 1

## 2014-11-16 NOTE — ED Notes (Signed)
She c/o abd. Pain and has ran out of her Dilaudid home med. She is cachectic in appearance and in no distress.

## 2014-11-16 NOTE — ED Provider Notes (Signed)
CSN: [id number]     Arrival date & time 11/16/14  6761 History   First MD Initiated Contact with Patient 11/16/14 [phone]     Chief Complaint  Patient presents with  . Abdominal Pain     (Consider location/radiation/quality/duration/timing/severity/associated sxs/prior Treatment) HPI Comments: 64 y.o. Female with history of pancreatitis, COPD, substance abuse presents for abdominal pain.  The patient states that about 4 days ago she ran out of her home dilaudid which is prescibed to her by Dr. Alyson Ingles her PCP and that since that time she has been developing worsening abdominal pain.  She says the pain has become severe and is in her upper abdomen where her pain from her pancreatitis usually is.  She reports associated nausea without vomiting.  No diarrhea.  HAs had a bowel movement and is passing gas.  She also reports that she is due for a nebulizer treatment but denies recent worsening of COPD, increased cough, or sputum production.     Past Medical History  Diagnosis Date  . Pancreatitis   . COPD (chronic obstructive pulmonary disease) ([place])   . Substance abuse   . Cancer ([place])     renal ca  . Seizures ([place])   . Pancreatitis    Past Surgical History  Procedure Laterality Date  . Partial gastrectomy    . Kidney surgery      removed cancerous lesions   Family History  Problem Relation Age of Onset  . CAD Mother   . Alcoholism Father   . COPD Father   . COPD Father    Social History  Substance Use Topics  . Smoking status: Current Every Day Smoker -- 1.00 packs/day for 30 years    Types: Cigarettes  . Smokeless tobacco: Never Used  . Alcohol Use: 0.0 oz/week    0 Standard drinks or equivalent per week   OB History    No data available     Review of Systems  Constitutional: Positive for appetite change. Negative for fever, chills and fatigue.  HENT: Negative for congestion, postnasal drip, rhinorrhea and sinus pressure.   Eyes: Negative for pain and visual  disturbance.  Respiratory: Negative for cough, chest tightness and shortness of breath.   Cardiovascular: Negative for chest pain and palpitations.  Gastrointestinal: Positive for nausea and abdominal pain. Negative for vomiting and diarrhea.  Genitourinary: Negative for dysuria, urgency and hematuria.  Musculoskeletal: Negative for myalgias and back pain.  Neurological: Negative for dizziness, syncope, weakness and light-headedness.  Hematological: Negative for adenopathy.      Allergies  Aspirin; Chlorpromazine hcl; and Codeine  Home Medications   Prior to Admission medications   Medication Sig Start Date End Date Taking? Authorizing Provider  albuterol (PROVENTIL HFA;VENTOLIN HFA) 108 (90 BASE) MCG/ACT inhaler Inhale 2 puffs into the lungs every 6 (six) hours as needed for wheezing or shortness of breath. 09/23/13  Yes Sherwood Gambler, MD  DULERA 200-5 MCG/ACT AERO INHALE 2 PUFFS INTO THE LUNGS TWICE DAILY 09/16/14  Yes Tammy S Parrett, NP  HYDROmorphone (DILAUDID) 4 MG tablet Take 1 tablet (4 mg total) by mouth every 6 (six) hours as needed for severe pain. 09/23/13  Yes Sherwood Gambler, MD  lisinopril (PRINIVIL,ZESTRIL) 10 MG tablet Take 1 tablet (10 mg total) by mouth daily. 06/06/13  Yes Costin Karlyne Greenspan, MD  LORazepam (ATIVAN) 1 MG tablet Take 1 tablet (1 mg total) by mouth every 6 (six) hours as needed. For anxiety. 04/01/13  Yes Estela Leonie Green, MD  ondansetron (  ZOFRAN) 4 MG tablet Take 4 mg by mouth 3 (three) times daily as needed for nausea or vomiting.  05/09/14  Yes Historical Provider, MD  tiotropium (SPIRIVA HANDIHALER) 18 MCG inhalation capsule Place 1 capsule (18 mcg total) into inhaler and inhale daily. 07/17/14  Yes Tammy S Parrett, NP  oxyCODONE-acetaminophen (PERCOCET) 10-325 MG tablet Take 1 tablet by mouth every 4 (four) hours as needed for pain. 11/16/14   Harvel Quale, MD   BP 131/93 mmHg  Pulse 90  Temp(Src) 97.7 F (36.5 C) (Oral)  Resp 13  SpO2  95% Physical Exam  Constitutional: She is oriented to person, place, and time. She appears well-developed. She appears cachectic. No distress.  HENT:  Head: Normocephalic and atraumatic.  Right Ear: External ear normal.  Left Ear: External ear normal.  Nose: Nose normal.  Mouth/Throat: Oropharynx is clear and moist. No oropharyngeal exudate.  Eyes: EOM are normal. Pupils are equal, round, and reactive to light.  Neck: Normal range of motion. Neck supple.  Cardiovascular: Regular rhythm, normal heart sounds and intact distal pulses.  Tachycardia present.   No murmur heard. Pulmonary/Chest: Effort normal. No respiratory distress. She has decreased breath sounds (mildly decreased breathsounds bilaterally consistent with COPD). She has no wheezes. She has no rales.  Abdominal: Soft. She exhibits no distension. There is tenderness (mild epigastric).  Musculoskeletal: Normal range of motion. She exhibits no edema or tenderness.  Neurological: She is alert and oriented to person, place, and time.  Skin: Skin is warm and dry. No rash noted. She is not diaphoretic.  Psychiatric: She is agitated.  Vitals reviewed.   ED Course  Procedures (including critical care time) Labs Review Labs Reviewed  COMPREHENSIVE METABOLIC PANEL - Abnormal; Notable for the following:    Sodium 133 (*)    CO2 21 (*)    Glucose, Bld 115 (*)    All other components within normal limits  LIPASE, BLOOD - Abnormal; Notable for the following:    Lipase 144 (*)    All other components within normal limits  CBC WITH DIFFERENTIAL/PLATELET  I-STAT TROPOININ, ED    Imaging Review Dg Abd Acute W/chest  11/16/2014  CLINICAL DATA:  Epigastric pain, nausea, vomiting and shortness of breath. History of chronic pancreatitis and renal cell carcinoma. EXAM: DG ABDOMEN ACUTE W/ 1V CHEST COMPARISON:  07/03/2014 FINDINGS: Stable pulmonary hyperinflation and COPD. There is no evidence of pulmonary edema, consolidation,  pneumothorax, nodule or pleural fluid. Stable old left-sided lower rib fractures. The heart size and mediastinal contours are normal. Abdominal films show no evidence of bowel obstruction or significant ileus. No free air is identified. No abnormal calcifications. No soft tissue abnormalities. IMPRESSION: No acute findings in the chest or abdomen.  Stable COPD. Electronically Signed   By: Aletta Edouard M.D.   On: 11/16/2014 10:24   I have personally reviewed and evaluated these images and lab results as part of my medical decision-making.   EKG Interpretation   Date/Time:  Saturday November 16 2014 09:52:23 EST Ventricular Rate:  108 PR Interval:  145 QRS Duration: 68 QT Interval:  334 QTC Calculation: 448 R Axis:   78 Text Interpretation:  Sinus tachycardia Right atrial enlargement Baseline  wander in lead(s) II III aVF V5 V6 No significant change since last  tracing Confirmed by Jaskiran Pata ([id number]) on 11/16/2014 11:11:28 AM      MDM  Patient seen and evaluated in stable condition.  Presentation consistent with increased pain secondary to withdrawal from home  pain medication as well as pancreatitis.  Mild elevation in lipase.  Other labs unremarkable.  AAS unremarkable.  Patient without sign or symptom of COPD exacerbation.  Symptoms improved with pain medication and fluids.  Patient tolerating PO.  Has follow up appointment with PCP on Monday.  Discharged in stable condition to follow up with PCP and with prescription for Percocet until follow up appointment.  Patient given strict return precautions and instructed to eat a clear liquid diet. Final diagnoses:  Acute pancreatitis, unspecified pancreatitis type    1. Pancreatitis      Harvel Quale, MD 11/16/14 2302

## 2014-11-16 NOTE — Discharge Instructions (Signed)
Take the pain medication as prescribed.  Follow up with your primary care physician on Monday for reevaluation and for follow up for your prescriptions.  Follow a clear liquid diet.  Return with worsening symptoms.  Acute Pancreatitis Acute pancreatitis is a disease in which the pancreas becomes suddenly inflamed. The pancreas is a large gland located behind your stomach. The pancreas produces enzymes that help digest food. The pancreas also releases the hormones glucagon and insulin that help regulate blood sugar. Damage to the pancreas occurs when the digestive enzymes from the pancreas are activated and begin attacking the pancreas before being released into the intestine. Most acute attacks last a couple of days and can cause serious complications. Some people become dehydrated and develop low blood pressure. In severe cases, bleeding into the pancreas can lead to shock and can be life-threatening. The lungs, heart, and kidneys may fail. CAUSES  Pancreatitis can happen to anyone. In some cases, the cause is unknown. Most cases are caused by:  Alcohol abuse.  Gallstones. Other less common causes are:  Certain medicines.  Exposure to certain chemicals.  Infection.  Damage caused by an accident (trauma).  Abdominal surgery. SYMPTOMS   Pain in the upper abdomen that may radiate to the back.  Tenderness and swelling of the abdomen.  Nausea and vomiting. DIAGNOSIS  Your caregiver will perform a physical exam. Blood and stool tests may be done to confirm the diagnosis. Imaging tests may also be done, such as X-rays, CT scans, or an ultrasound of the abdomen. TREATMENT  Treatment usually requires a stay in the hospital. Treatment may include:  Pain medicine.  Fluid replacement through an intravenous line (IV).  Placing a tube in the stomach to remove stomach contents and control vomiting.  Not eating for 3 or 4 days. This gives your pancreas a rest, because enzymes are not being  produced that can cause further damage.  Antibiotic medicines if your condition is caused by an infection.  Surgery of the pancreas or gallbladder. HOME CARE INSTRUCTIONS   Follow the diet advised by your caregiver. This may involve avoiding alcohol and decreasing the amount of fat in your diet.  Eat smaller, more frequent meals. This reduces the amount of digestive juices the pancreas produces.  Drink enough fluids to keep your urine clear or pale yellow.  Only take over-the-counter or prescription medicines as directed by your caregiver.  Avoid drinking alcohol if it caused your condition.  Do not smoke.  Get plenty of rest.  Check your blood sugar at home as directed by your caregiver.  Keep all follow-up appointments as directed by your caregiver. SEEK MEDICAL CARE IF:   You do not recover as quickly as expected.  You develop new or worsening symptoms.  You have persistent pain, weakness, or nausea.  You recover and then have another episode of pain. SEEK IMMEDIATE MEDICAL CARE IF:   You are unable to eat or keep fluids down.  Your pain becomes severe.  You have a fever or persistent symptoms for more than 2 to 3 days.  You have a fever and your symptoms suddenly get worse.  Your skin or the white part of your eyes turn yellow (jaundice).  You develop vomiting.  You feel dizzy, or you faint.  Your blood sugar is high (over 300 mg/dL). MAKE SURE YOU:   Understand these instructions.  Will watch your condition.  Will get help right away if you are not doing well or get worse.  This information is not intended to replace advice given to you by your health care provider. Make sure you discuss any questions you have with your health care provider.   Document Released: 12/21/2004 Document Revised: 06/22/2011 Document Reviewed: 04/01/2011 Elsevier Interactive Patient Education [employer].

## 2014-11-16 NOTE — ED Notes (Signed)
Pt states she is out of home dilaudid and nausea medication, Should be getting more on Monday from dr. Noah Delaine

## 2014-11-16 NOTE — ED Notes (Signed)
Patient will let us know when she can urinate

## 2014-11-16 NOTE — ED Notes (Signed)
Pt request the nurse Rn to speak with family member in re guards to hospital stay. Investigating options and policies.

## 2014-11-16 NOTE — ED Notes (Signed)
Pt states she can't keep food, and has vomited twice in last 24 hrs.states she has a hx of pancreatitis

## 2014-11-16 NOTE — ED Notes (Signed)
Pt sts that she is unable to urinate at this time

## 2014-11-16 NOTE — ED Notes (Signed)
Pt discharge given, V/s within normal ranges, pain assessment conducted temporary management of pain obtained.

## 2014-11-16 NOTE — ED Notes (Signed)
Dr Alfonse Spruce aware that pt sts her pain is still 8/10

## 2014-12-26 ENCOUNTER — Telehealth: Payer: Self-pay | Admitting: Pulmonary Disease

## 2014-12-26 ENCOUNTER — Other Ambulatory Visit: Payer: Self-pay | Admitting: Adult Health

## 2014-12-26 MED ORDER — TIOTROPIUM BROMIDE MONOHYDRATE 18 MCG IN CAPS: 18.0000 ug | ORAL_CAPSULE | Freq: Every day | RESPIRATORY_TRACT | Status: DC

## 2014-12-26 NOTE — Telephone Encounter (Signed)
Patient Returned call [phone]

## 2014-12-26 NOTE — Telephone Encounter (Signed)
Left message for Legrand Como to call back.

## 2014-12-26 NOTE — Telephone Encounter (Signed)
Rx has been sent in. Pt's husband is aware. Nothing further was needed. 

## 2014-12-28 ENCOUNTER — Emergency Department (HOSPITAL_COMMUNITY): Payer: Medicaid Other

## 2014-12-28 ENCOUNTER — Emergency Department (HOSPITAL_COMMUNITY)
Admission: EM | Admit: 2014-12-28 | Discharge: 2014-12-28 | Discharge status: Against Medical Advice | Payer: Medicaid Other | Attending: Emergency Medicine | Admitting: Emergency Medicine

## 2014-12-28 ENCOUNTER — Encounter (HOSPITAL_COMMUNITY): Payer: Self-pay | Admitting: Emergency Medicine

## 2014-12-28 DIAGNOSIS — Z85528 Personal history of other malignant neoplasm of kidney: Secondary | ICD-10-CM | POA: Insufficient documentation

## 2014-12-28 DIAGNOSIS — F17210 Nicotine dependence, cigarettes, uncomplicated: Secondary | ICD-10-CM | POA: Diagnosis not present

## 2014-12-28 DIAGNOSIS — R109 Unspecified abdominal pain: Secondary | ICD-10-CM | POA: Diagnosis present

## 2014-12-28 DIAGNOSIS — G8929 Other chronic pain: Secondary | ICD-10-CM

## 2014-12-28 DIAGNOSIS — R1013 Epigastric pain: Secondary | ICD-10-CM | POA: Diagnosis not present

## 2014-12-28 DIAGNOSIS — Z79899 Other long term (current) drug therapy: Secondary | ICD-10-CM | POA: Diagnosis not present

## 2014-12-28 DIAGNOSIS — J449 Chronic obstructive pulmonary disease, unspecified: Secondary | ICD-10-CM | POA: Insufficient documentation

## 2014-12-28 DIAGNOSIS — Z8719 Personal history of other diseases of the digestive system: Secondary | ICD-10-CM | POA: Diagnosis not present

## 2014-12-28 LAB — COMPREHENSIVE METABOLIC PANEL
ALK PHOS: 71 U/L (ref 38–126)
ALT: 23 U/L (ref 14–54)
AST: 36 U/L (ref 15–41)
Albumin: 3.7 g/dL (ref 3.5–5.0)
Anion gap: 13 (ref 5–15)
BUN: 10 mg/dL (ref 6–20)
CALCIUM: 9 mg/dL (ref 8.9–10.3)
CO2: 20 mmol/L — AB (ref 22–32)
CREATININE: 0.71 mg/dL (ref 0.44–1.00)
Chloride: 97 mmol/L — ABNORMAL LOW (ref 101–111)
Glucose, Bld: 86 mg/dL (ref 65–99)
Potassium: 4.6 mmol/L (ref 3.5–5.1)
Sodium: 130 mmol/L — ABNORMAL LOW (ref 135–145)
Total Bilirubin: 0.4 mg/dL (ref 0.3–1.2)
Total Protein: 6.7 g/dL (ref 6.5–8.1)

## 2014-12-28 LAB — LIPASE, BLOOD: Lipase: 32 U/L (ref 11–51)

## 2014-12-28 LAB — CBC
HCT: 36.8 % (ref 36.0–46.0)
Hemoglobin: 12.9 g/dL (ref 12.0–15.0)
MCH: 32.2 pg (ref 26.0–34.0)
MCHC: 35.1 g/dL (ref 30.0–36.0)
MCV: 91.8 fL (ref 78.0–100.0)
Platelets: 179 10*3/uL (ref 150–400)
RBC: 4.01 MIL/uL (ref 3.87–5.11)
RDW: 14.1 % (ref 11.5–15.5)
WBC: 8.2 10*3/uL (ref 4.0–10.5)

## 2014-12-28 LAB — TROPONIN I: Troponin I: <0.03 ng/mL (ref <0.031)

## 2014-12-28 MED ORDER — ONDANSETRON 8 MG PO TBDP
8.0000 mg | ORAL_TABLET | Freq: Once | ORAL | Status: AC
  Administered 2014-12-28: 8 mg via ORAL
  Filled 2014-12-28: qty 1

## 2014-12-28 MED ORDER — HYDROMORPHONE HCL 1 MG/ML IJ SOLN
1.0000 mg | Freq: Once | INTRAMUSCULAR | Status: AC
  Administered 2014-12-28: 1 mg via INTRAMUSCULAR
  Filled 2014-12-28: qty 1

## 2014-12-28 MED ORDER — IPRATROPIUM-ALBUTEROL 0.5-2.5 (3) MG/3ML IN SOLN
3.0000 mL | Freq: Once | RESPIRATORY_TRACT | Status: AC
  Administered 2014-12-28: 3 mL via RESPIRATORY_TRACT
  Filled 2014-12-28: qty 3

## 2014-12-28 NOTE — ED Notes (Signed)
Pt. Left AMA , ambulatory , on RA, husband to pick her up.

## 2014-12-28 NOTE — ED Notes (Signed)
Per pt, states she drank beer to help her breath better-now having abdominal pain

## 2014-12-28 NOTE — ED Provider Notes (Signed)
CSN: [id number]     Arrival date & time 12/28/14  1738 History   First MD Initiated Contact with Patient 12/28/14 2020     Chief Complaint  Patient presents with  . Pancreatitis     (Consider location/radiation/quality/duration/timing/severity/associated sxs/prior Treatment) HPI Comments: Patient presents to the emergency department with chief complaint of abdominal pain. She has a history of pancreatitis. She states that she was out of breath today so she drank some beer. She states that shortly thereafter she began having abdominal pain.  She states that she has felt out of breath for the past 8 months. She denies any chest pain. She denies any fevers, chills, or cough. She is in everyday smoker. Her symptoms are currently only upper abdominal pain. She has not tried anything else for her symptoms.  The history is provided by the patient. No language interpreter was used.    Past Medical History  Diagnosis Date  . Pancreatitis   . COPD (chronic obstructive pulmonary disease) ([place])   . Substance abuse   . Cancer ([place])     renal ca  . Seizures ([place])   . Pancreatitis    Past Surgical History  Procedure Laterality Date  . Partial gastrectomy    . Kidney surgery      removed cancerous lesions   Family History  Problem Relation Age of Onset  . CAD Mother   . Alcoholism Father   . COPD Father   . COPD Father    Social History  Substance Use Topics  . Smoking status: Current Every Day Smoker -- 1.00 packs/day for 30 years    Types: Cigarettes  . Smokeless tobacco: Never Used  . Alcohol Use: 0.0 oz/week    0 Standard drinks or equivalent per week   OB History    No data available     Review of Systems  Constitutional: Negative for fever and chills.  Respiratory: Negative for shortness of breath.   Cardiovascular: Negative for chest pain.  Gastrointestinal: Positive for abdominal pain. Negative for nausea, vomiting, diarrhea and constipation.  Genitourinary: Negative  for dysuria.  All other systems reviewed and are negative.     Allergies  Aspirin; Chlorpromazine hcl; and Codeine  Home Medications   Prior to Admission medications   Medication Sig Start Date End Date Taking? Authorizing Provider  albuterol (PROVENTIL HFA;VENTOLIN HFA) 108 (90 BASE) MCG/ACT inhaler Inhale 2 puffs into the lungs every 6 (six) hours as needed for wheezing or shortness of breath. 09/23/13  Yes Sherwood Gambler, MD  DULERA 200-5 MCG/ACT AERO INHALE 2 PUFFS INTO THE LUNGS TWICE DAILY 09/16/14  Yes Tammy S Parrett, NP  HYDROmorphone (DILAUDID) 4 MG tablet Take 1 tablet (4 mg total) by mouth every 6 (six) hours as needed for severe pain. 09/23/13  Yes Sherwood Gambler, MD  lisinopril (PRINIVIL,ZESTRIL) 10 MG tablet Take 1 tablet (10 mg total) by mouth daily. 06/06/13  Yes Costin Karlyne Greenspan, MD  LORazepam (ATIVAN) 1 MG tablet Take 1 tablet (1 mg total) by mouth every 6 (six) hours as needed. For anxiety. Patient taking differently: Take 1 mg by mouth every 6 (six) hours as needed for anxiety. For anxiety. 04/01/13  Yes Erline Hau, MD  ondansetron (ZOFRAN) 4 MG tablet Take 4 mg by mouth 3 (three) times daily as needed for nausea or vomiting.  05/09/14  Yes Historical Provider, MD  oxyCODONE-acetaminophen (PERCOCET) 10-325 MG tablet Take 1 tablet by mouth every 4 (four) hours as needed for pain. Patient  not taking: Reported on 12/28/2014 11/16/14   Harvel Quale, MD  tiotropium (SPIRIVA HANDIHALER) 18 MCG inhalation capsule Place 1 capsule (18 mcg total) into inhaler and inhale daily. Patient not taking: Reported on 12/28/2014 12/26/14   Juanito Doom, MD   BP 141/106 mmHg  Pulse 102  Temp(Src) 98.4 F (36.9 C) (Oral)  Resp 22  SpO2 100% Physical Exam  Constitutional: She is oriented to person, place, and time. She appears well-developed and well-nourished.  HENT:  Head: Normocephalic and atraumatic.  Eyes: Conjunctivae and EOM are normal. Pupils are equal, round,  and reactive to light.  Neck: Normal range of motion. Neck supple.  Cardiovascular: Normal rate and regular rhythm.  Exam reveals no gallop and no friction rub.   No murmur heard. Pulmonary/Chest: Effort normal and breath sounds normal. No respiratory distress. She has no wheezes. She has no rales. She exhibits no tenderness.  Clear to auscultation bilaterally  Abdominal: Soft. Bowel sounds are normal. She exhibits no distension and no mass. There is no tenderness. There is no rebound and no guarding.  Epigastric abdominal tenderness, no other focal abdominal tenderness  Musculoskeletal: Normal range of motion. She exhibits no edema or tenderness.  Neurological: She is alert and oriented to person, place, and time.  Skin: Skin is warm and dry.  Psychiatric: She has a normal mood and affect. Her behavior is normal. Judgment and thought content normal.  Nursing note and vitals reviewed.   ED Course  Procedures (including critical care time) Results for orders placed or performed during the hospital encounter of 12/28/14  CBC  Result Value Ref Range   WBC 8.2 4.0 - 10.5 K/uL   RBC 4.01 3.87 - 5.11 MIL/uL   Hemoglobin 12.9 12.0 - 15.0 g/dL   HCT 36.8 36.0 - 46.0 %   MCV 91.8 78.0 - 100.0 fL   MCH 32.2 26.0 - 34.0 pg   MCHC 35.1 30.0 - 36.0 g/dL   RDW 14.1 11.5 - 15.5 %   Platelets 179 150 - 400 K/uL  Troponin I  Result Value Ref Range   Troponin I <0.03 <0.031 ng/mL  Comprehensive metabolic panel  Result Value Ref Range   Sodium 130 (L) 135 - 145 mmol/L   Potassium 4.6 3.5 - 5.1 mmol/L   Chloride 97 (L) 101 - 111 mmol/L   CO2 20 (L) 22 - 32 mmol/L   Glucose, Bld 86 65 - 99 mg/dL   BUN 10 6 - 20 mg/dL   Creatinine, Ser 0.71 0.44 - 1.00 mg/dL   Calcium 9.0 8.9 - 10.3 mg/dL   Total Protein 6.7 6.5 - 8.1 g/dL   Albumin 3.7 3.5 - 5.0 g/dL   AST 36 15 - 41 U/L   ALT 23 14 - 54 U/L   Alkaline Phosphatase 71 38 - 126 U/L   Total Bilirubin 0.4 0.3 - 1.2 mg/dL   GFR calc non Af Amer  >60 >60 mL/min   GFR calc Af Amer >60 >60 mL/min   Anion gap 13 5 - 15  Lipase, blood  Result Value Ref Range   Lipase 32 11 - 51 U/L   Dg Chest 2 View  12/28/2014  CLINICAL DATA:  Per pt, states she drank beer to help her breathe better-now having abdominal pain. When asked about chest complaints pt states she hurts all over. Current smoker, COPD. EXAM: CHEST - 2 VIEW COMPARISON:  11/16/2014 FINDINGS: Lungs hyperinflated, clear.  Heart size normal. No effusion.  No pneumothorax.  Visualized skeletal structures are unremarkable. IMPRESSION: Hyperinflation without acute or superimposed abnormality. Electronically Signed   By: Lucrezia Europe M.D.   On: 12/28/2014 18:32    I have personally reviewed and evaluated these images and lab results as part of my medical decision-making.   MDM   Final diagnoses:  Chronic abdominal pain    Patient with chronic abdominal pain. States that she started drinking alcohol today, began having a flareup of her pancreatitis. She is requesting pain medicine. I will give her a dose of pain medicine, check labs.  Difficulty obtaining labs due to patient being a hard stick.  Labs are reassuring. Unfortunately, patient eloped prior to return of labs. Per the nurse she was asking for more pain medicine. I am concerned about drug seeking behavior given that she repeatedly asked for meds and said she would not give her blood until she received pain meds.    Montine Circle, PA-C 12/28/14 2246  Sherwood Gambler, MD 01/04/15 [phone]

## 2014-12-28 NOTE — ED Notes (Signed)
I ATTEMPTED TO COLLECT PATIENT LABS AND DID NOT SEE ANYTHING.  PATIENT HAS A LOT OF BRUISING ON HER ARM.  I WILL ASK SOMEONE ELSE TO TAKE A LOOK.

## 2015-01-03 ENCOUNTER — Encounter (HOSPITAL_COMMUNITY): Payer: Self-pay | Admitting: Emergency Medicine

## 2015-01-03 ENCOUNTER — Inpatient Hospital Stay (HOSPITAL_COMMUNITY)
Admission: EM | Admit: 2015-01-03 | Discharge: 2015-01-05 | DRG: 438 | Discharge status: Against Medical Advice | Payer: Medicaid Other | Attending: Internal Medicine | Admitting: Internal Medicine

## 2015-01-03 DIAGNOSIS — K2970 Gastritis, unspecified, without bleeding: Secondary | ICD-10-CM | POA: Diagnosis present

## 2015-01-03 DIAGNOSIS — I10 Essential (primary) hypertension: Secondary | ICD-10-CM | POA: Diagnosis present

## 2015-01-03 DIAGNOSIS — Z681 Body mass index (BMI) 19 or less, adult: Secondary | ICD-10-CM | POA: Diagnosis not present

## 2015-01-03 DIAGNOSIS — Z903 Acquired absence of stomach [part of]: Secondary | ICD-10-CM | POA: Diagnosis not present

## 2015-01-03 DIAGNOSIS — Z79899 Other long term (current) drug therapy: Secondary | ICD-10-CM

## 2015-01-03 DIAGNOSIS — Z811 Family history of alcohol abuse and dependence: Secondary | ICD-10-CM

## 2015-01-03 DIAGNOSIS — E43 Unspecified severe protein-calorie malnutrition: Secondary | ICD-10-CM | POA: Diagnosis present

## 2015-01-03 DIAGNOSIS — E860 Dehydration: Secondary | ICD-10-CM | POA: Diagnosis present

## 2015-01-03 DIAGNOSIS — Z8711 Personal history of peptic ulcer disease: Secondary | ICD-10-CM | POA: Diagnosis not present

## 2015-01-03 DIAGNOSIS — K852 Alcohol induced acute pancreatitis without necrosis or infection: Secondary | ICD-10-CM | POA: Diagnosis present

## 2015-01-03 DIAGNOSIS — J449 Chronic obstructive pulmonary disease, unspecified: Secondary | ICD-10-CM | POA: Diagnosis present

## 2015-01-03 DIAGNOSIS — F17210 Nicotine dependence, cigarettes, uncomplicated: Secondary | ICD-10-CM | POA: Diagnosis present

## 2015-01-03 DIAGNOSIS — Z720 Tobacco use: Secondary | ICD-10-CM | POA: Diagnosis not present

## 2015-01-03 DIAGNOSIS — F1010 Alcohol abuse, uncomplicated: Secondary | ICD-10-CM | POA: Diagnosis present

## 2015-01-03 DIAGNOSIS — R319 Hematuria, unspecified: Secondary | ICD-10-CM | POA: Diagnosis present

## 2015-01-03 DIAGNOSIS — R1010 Upper abdominal pain, unspecified: Secondary | ICD-10-CM | POA: Diagnosis present

## 2015-01-03 DIAGNOSIS — E871 Hypo-osmolality and hyponatremia: Secondary | ICD-10-CM | POA: Diagnosis present

## 2015-01-03 DIAGNOSIS — Z8249 Family history of ischemic heart disease and other diseases of the circulatory system: Secondary | ICD-10-CM | POA: Diagnosis not present

## 2015-01-03 DIAGNOSIS — K860 Alcohol-induced chronic pancreatitis: Secondary | ICD-10-CM | POA: Diagnosis present

## 2015-01-03 DIAGNOSIS — Z452 Encounter for adjustment and management of vascular access device: Secondary | ICD-10-CM

## 2015-01-03 DIAGNOSIS — R1013 Epigastric pain: Secondary | ICD-10-CM | POA: Diagnosis not present

## 2015-01-03 DIAGNOSIS — R109 Unspecified abdominal pain: Secondary | ICD-10-CM | POA: Diagnosis present

## 2015-01-03 DIAGNOSIS — R636 Underweight: Secondary | ICD-10-CM | POA: Diagnosis present

## 2015-01-03 LAB — COMPREHENSIVE METABOLIC PANEL
ALT: 26 U/L (ref 14–54)
AST: 34 U/L (ref 15–41)
Albumin: 4.9 g/dL (ref 3.5–5.0)
Alkaline Phosphatase: 93 U/L (ref 38–126)
Anion gap: 12 (ref 5–15)
BUN: 9 mg/dL (ref 6–20)
CO2: 23 mmol/L (ref 22–32)
Calcium: 10.4 mg/dL — ABNORMAL HIGH (ref 8.9–10.3)
Chloride: 96 mmol/L — ABNORMAL LOW (ref 101–111)
Creatinine, Ser: 0.98 mg/dL (ref 0.44–1.00)
GFR calc Af Amer: >60 mL/min (ref >60)
GFR calc non Af Amer: >60 mL/min (ref >60)
Glucose, Bld: 149 mg/dL — ABNORMAL HIGH (ref 65–99)
Potassium: 4.1 mmol/L (ref 3.5–5.1)
Sodium: 131 mmol/L — ABNORMAL LOW (ref 135–145)
Total Bilirubin: 0.8 mg/dL (ref 0.3–1.2)
Total Protein: 8.7 g/dL — ABNORMAL HIGH (ref 6.5–8.1)

## 2015-01-03 LAB — URINALYSIS, ROUTINE W REFLEX MICROSCOPIC
Glucose, UA: NEGATIVE mg/dL
Ketones, ur: 15 mg/dL — AB
Leukocytes, UA: NEGATIVE
Nitrite: NEGATIVE
Protein, ur: NEGATIVE mg/dL
Specific Gravity, Urine: 1.012 (ref 1.005–1.030)
pH: 6.5 (ref 5.0–8.0)

## 2015-01-03 LAB — URINE MICROSCOPIC-ADD ON

## 2015-01-03 LAB — I-STAT TROPONIN, ED: Troponin i, poc: 0.01 ng/mL (ref 0.00–0.08)

## 2015-01-03 LAB — CBC
HCT: 41.5 % (ref 36.0–46.0)
Hemoglobin: 14.1 g/dL (ref 12.0–15.0)
MCH: 31.3 pg (ref 26.0–34.0)
MCHC: 34 g/dL (ref 30.0–36.0)
MCV: 92 fL (ref 78.0–100.0)
Platelets: 167 10*3/uL (ref 150–400)
RBC: 4.51 MIL/uL (ref 3.87–5.11)
RDW: 13.9 % (ref 11.5–15.5)
WBC: 7.4 10*3/uL (ref 4.0–10.5)

## 2015-01-03 LAB — MAGNESIUM: Magnesium: 1.9 mg/dL (ref 1.7–2.4)

## 2015-01-03 LAB — ETHANOL: Alcohol, Ethyl (B): <5 mg/dL (ref <5)

## 2015-01-03 LAB — LIPASE, BLOOD: Lipase: 381 U/L — ABNORMAL HIGH (ref 11–51)

## 2015-01-03 MED ORDER — ALBUTEROL SULFATE (2.5 MG/3ML) 0.083% IN NEBU
2.5000 mg | INHALATION_SOLUTION | Freq: Once | RESPIRATORY_TRACT | Status: AC
  Administered 2015-01-03: 2.5 mg via RESPIRATORY_TRACT
  Filled 2015-01-03: qty 3

## 2015-01-03 MED ORDER — ENOXAPARIN SODIUM 30 MG/0.3ML ~~LOC~~ SOLN
30.0000 mg | SUBCUTANEOUS | Status: DC
  Administered 2015-01-03 – 2015-01-04 (×2): 30 mg via SUBCUTANEOUS
  Filled 2015-01-03 (×2): qty 0.3

## 2015-01-03 MED ORDER — ONDANSETRON HCL 4 MG/2ML IJ SOLN
4.0000 mg | Freq: Four times a day (QID) | INTRAMUSCULAR | Status: DC | PRN
Start: 2015-01-03 — End: 2015-01-05
  Administered 2015-01-03 – 2015-01-04 (×4): 4 mg via INTRAVENOUS
  Filled 2015-01-03 (×4): qty 2

## 2015-01-03 MED ORDER — LORAZEPAM 2 MG/ML IJ SOLN
2.0000 mg | INTRAMUSCULAR | Status: DC | PRN
  Administered 2015-01-03 – 2015-01-04 (×2): 2 mg via INTRAMUSCULAR
  Filled 2015-01-03 (×2): qty 1

## 2015-01-03 MED ORDER — VITAMIN B-1 100 MG PO TABS
100.0000 mg | ORAL_TABLET | Freq: Every day | ORAL | Status: DC
  Filled 2015-01-03 (×2): qty 1

## 2015-01-03 MED ORDER — ONDANSETRON HCL 4 MG PO TABS
4.0000 mg | ORAL_TABLET | Freq: Four times a day (QID) | ORAL | Status: DC | PRN
  Administered 2015-01-05: 4 mg via ORAL
  Filled 2015-01-03: qty 1

## 2015-01-03 MED ORDER — MORPHINE SULFATE (PF) 2 MG/ML IV SOLN
2.0000 mg | INTRAVENOUS | Status: DC | PRN
  Administered 2015-01-04 (×2): 2 mg via INTRAMUSCULAR
  Filled 2015-01-03 (×2): qty 1

## 2015-01-03 MED ORDER — LORAZEPAM 2 MG/ML IJ SOLN
2.0000 mg | INTRAMUSCULAR | Status: DC | PRN
  Administered 2015-01-03: 2 mg via INTRAVENOUS
  Filled 2015-01-03: qty 1

## 2015-01-03 MED ORDER — SODIUM CHLORIDE 0.9 % IV BOLUS (SEPSIS)
500.0000 mL | Freq: Once | INTRAVENOUS | Status: AC
  Administered 2015-01-03: 500 mL via INTRAVENOUS

## 2015-01-03 MED ORDER — MORPHINE SULFATE (PF) 2 MG/ML IV SOLN
2.0000 mg | Freq: Once | INTRAVENOUS | Status: AC
  Administered 2015-01-03: 2 mg via INTRAMUSCULAR

## 2015-01-03 MED ORDER — ONDANSETRON HCL 4 MG/2ML IJ SOLN
4.0000 mg | Freq: Once | INTRAMUSCULAR | Status: AC
  Administered 2015-01-03: 4 mg via INTRAVENOUS
  Filled 2015-01-03: qty 2

## 2015-01-03 MED ORDER — SODIUM CHLORIDE 0.9 % IV SOLN
INTRAVENOUS | Status: DC
  Administered 2015-01-03 – 2015-01-04 (×2): via INTRAVENOUS

## 2015-01-03 MED ORDER — HYDROMORPHONE HCL 1 MG/ML IJ SOLN
1.0000 mg | Freq: Once | INTRAMUSCULAR | Status: AC
Start: 2015-01-03 — End: 2015-01-03
  Administered 2015-01-03: 1 mg via INTRAVENOUS
  Filled 2015-01-03: qty 1

## 2015-01-03 MED ORDER — FOLIC ACID 1 MG PO TABS
1.0000 mg | ORAL_TABLET | Freq: Every day | ORAL | Status: DC
  Filled 2015-01-03 (×2): qty 1

## 2015-01-03 MED ORDER — ENOXAPARIN SODIUM 40 MG/0.4ML ~~LOC~~ SOLN: 40.0000 mg | SUBCUTANEOUS | Status: DC

## 2015-01-03 MED ORDER — METOCLOPRAMIDE HCL 5 MG/ML IJ SOLN
5.0000 mg | Freq: Once | INTRAMUSCULAR | Status: AC
  Administered 2015-01-03: 5 mg via INTRAVENOUS
  Filled 2015-01-03: qty 2

## 2015-01-03 MED ORDER — ALBUTEROL SULFATE (2.5 MG/3ML) 0.083% IN NEBU
3.0000 mL | INHALATION_SOLUTION | Freq: Four times a day (QID) | RESPIRATORY_TRACT | Status: DC | PRN
  Administered 2015-01-04 (×3): 3 mL via RESPIRATORY_TRACT
  Filled 2015-01-03 (×3): qty 3

## 2015-01-03 MED ORDER — ADULT MULTIVITAMIN W/MINERALS CH
1.0000 | ORAL_TABLET | Freq: Every day | ORAL | Status: DC
  Filled 2015-01-03 (×2): qty 1

## 2015-01-03 MED ORDER — MOMETASONE FURO-FORMOTEROL FUM 200-5 MCG/ACT IN AERO
2.0000 | INHALATION_SPRAY | Freq: Two times a day (BID) | RESPIRATORY_TRACT | Status: DC
  Administered 2015-01-03 – 2015-01-05 (×4): 2 via RESPIRATORY_TRACT
  Filled 2015-01-03: qty 8.8

## 2015-01-03 MED ORDER — SODIUM CHLORIDE 0.9 % IJ SOLN: 3.0000 mL | Freq: Two times a day (BID) | INTRAMUSCULAR | Status: DC

## 2015-01-03 MED ORDER — HYDROMORPHONE HCL 1 MG/ML IJ SOLN
1.0000 mg | INTRAMUSCULAR | Status: AC | PRN
  Administered 2015-01-03 (×3): 1 mg via INTRAVENOUS
  Filled 2015-01-03 (×3): qty 1

## 2015-01-03 MED ORDER — MORPHINE SULFATE (PF) 2 MG/ML IV SOLN
1.0000 mg | INTRAVENOUS | Status: DC | PRN
  Administered 2015-01-03 (×3): 1 mg via INTRAVENOUS
  Filled 2015-01-03 (×4): qty 1

## 2015-01-03 MED ORDER — DIAZEPAM 5 MG/ML IJ SOLN
5.0000 mg | Freq: Once | INTRAMUSCULAR | Status: AC
  Administered 2015-01-03: 5 mg via INTRAVENOUS
  Filled 2015-01-03: qty 2

## 2015-01-03 NOTE — ED Notes (Signed)
Pt's husband Ronalee Belts : [phone] updated per pt's request.

## 2015-01-03 NOTE — ED Notes (Signed)
Pt with hx of pancreatitis c/o abdominal pain radiating to back and chest onset 3 days. Pt states she has been drinking alcohol, food aggravates pain.

## 2015-01-03 NOTE — ED Notes (Signed)
Ice given to patient. RN aware.

## 2015-01-03 NOTE — Progress Notes (Signed)
Admit to SDU Janice Ramsay, MD  [facility] Pager [phone]  If 7PM-7AM, please contact night-coverage [email or web address] Password [email or web address]

## 2015-01-03 NOTE — ED Provider Notes (Signed)
CSN: [id number]     Arrival date & time 01/03/15  0962 History   First MD Initiated Contact with Patient 01/03/15 [phone]     Chief Complaint  Patient presents with  . Abdominal Pain     (Consider location/radiation/quality/duration/timing/severity/associated sxs/prior Treatment) HPI   64 year old female with abdominal pain. Periumbilical to epigastric. She has a past history recurrent pancreatitis. States her current symptoms similar to that. Pain worsening over the last 3 days. Admits to drinking beer on the day of symptom onset. Associated with nausea and vomited once yesterday. Black emesis. No fevers or chills. Past history of gastric ulcers status post partial gastrectomy. Denies any bloody or melanotic stool. No significant NSAID use. She has been taking Dilaudid at home with only mild relief of her symptoms.  Past Medical History  Diagnosis Date  . Pancreatitis   . COPD (chronic obstructive pulmonary disease) ([place])   . Substance abuse   . Cancer ([place])     renal ca  . Seizures ([place])   . Pancreatitis    Past Surgical History  Procedure Laterality Date  . Partial gastrectomy    . Kidney surgery      removed cancerous lesions   Family History  Problem Relation Age of Onset  . CAD Mother   . Alcoholism Father   . COPD Father   . COPD Father    Social History  Substance Use Topics  . Smoking status: Current Every Day Smoker -- 1.00 packs/day for 30 years    Types: Cigarettes  . Smokeless tobacco: Never Used  . Alcohol Use: 0.0 oz/week    0 Standard drinks or equivalent per week   OB History    No data available     Review of Systems  All systems reviewed and negative, other than as noted in HPI.   Allergies  Aspirin; Chlorpromazine hcl; and Codeine  Home Medications   Prior to Admission medications   Medication Sig Start Date End Date Taking? Authorizing Provider  albuterol (PROVENTIL HFA;VENTOLIN HFA) 108 (90 BASE) MCG/ACT inhaler Inhale 2 puffs into the  lungs every 6 (six) hours as needed for wheezing or shortness of breath. 09/23/13   Sherwood Gambler, MD  DULERA 200-5 MCG/ACT AERO INHALE 2 PUFFS INTO THE LUNGS TWICE DAILY 09/16/14   Tammy S Parrett, NP  HYDROmorphone (DILAUDID) 4 MG tablet Take 1 tablet (4 mg total) by mouth every 6 (six) hours as needed for severe pain. 09/23/13   Sherwood Gambler, MD  lisinopril (PRINIVIL,ZESTRIL) 10 MG tablet Take 1 tablet (10 mg total) by mouth daily. 06/06/13   Costin Karlyne Greenspan, MD  LORazepam (ATIVAN) 1 MG tablet Take 1 tablet (1 mg total) by mouth every 6 (six) hours as needed. For anxiety. Patient taking differently: Take 1 mg by mouth every 6 (six) hours as needed for anxiety. For anxiety. 04/01/13   Erline Hau, MD  ondansetron (ZOFRAN) 4 MG tablet Take 4 mg by mouth 3 (three) times daily as needed for nausea or vomiting.  05/09/14   Historical Provider, MD  oxyCODONE-acetaminophen (PERCOCET) 10-325 MG tablet Take 1 tablet by mouth every 4 (four) hours as needed for pain. Patient not taking: Reported on 12/28/2014 11/16/14   Harvel Quale, MD  tiotropium (SPIRIVA HANDIHALER) 18 MCG inhalation capsule Place 1 capsule (18 mcg total) into inhaler and inhale daily. Patient not taking: Reported on 12/28/2014 12/26/14   Juanito Doom, MD   BP 105/80 mmHg  Pulse 93  Temp(Src) 97.9 F (  36.6 C) (Oral)  Resp 15  Ht '5\' 4"'$  (1.626 m)  Wt 80 lb (36.288 kg)  BMI 13.73 kg/m2  SpO2 100% Physical Exam  Constitutional: She appears well-developed and well-nourished. No distress.  Cachectic. Frail and chronically ill-appearing.  HENT:  Head: Normocephalic and atraumatic.  Eyes: Conjunctivae are normal. Right eye exhibits no discharge. Left eye exhibits no discharge.  Neck: Neck supple.  Cardiovascular: Regular rhythm and normal heart sounds.  Exam reveals no gallop and no friction rub.   No murmur heard. tachycardic  Pulmonary/Chest: Effort normal and breath sounds normal. No respiratory distress.   Abdominal: Soft. She exhibits no distension. There is tenderness.  Tenderness across the upper abdomen, worse in the epigastrium. No rebound or guarding. No distention  Musculoskeletal: She exhibits no edema or tenderness.  Neurological: She is alert.  Skin: Skin is warm and dry.  Psychiatric: She has a normal mood and affect. Her behavior is normal. Thought content normal.  Nursing note and vitals reviewed.   ED Course  Procedures (including critical care time) Labs Review Labs Reviewed  LIPASE, BLOOD - Abnormal; Notable for the following:    Lipase 381 (*)    All other components within normal limits  COMPREHENSIVE METABOLIC PANEL - Abnormal; Notable for the following:    Sodium 131 (*)    Chloride 96 (*)    Glucose, Bld 149 (*)    Calcium 10.4 (*)    Total Protein 8.7 (*)    All other components within normal limits  URINALYSIS, ROUTINE W REFLEX MICROSCOPIC (NOT AT [facility]) - Abnormal; Notable for the following:    APPearance CLOUDY (*)    Hgb urine dipstick LARGE (*)    Bilirubin Urine SMALL (*)    Ketones, ur 15 (*)    All other components within normal limits  URINE MICROSCOPIC-ADD ON - Abnormal; Notable for the following:    Squamous Epithelial / LPF 0-5 (*)    Bacteria, UA RARE (*)    All other components within normal limits  ETHANOL  CBC  I-STAT TROPOININ, ED    Imaging Review Dg Chest Port 1 View  01/04/2015  CLINICAL DATA:  Central line placement.  Initial encounter. EXAM: PORTABLE CHEST 1 VIEW COMPARISON:  Chest radiograph performed 12/28/2014 FINDINGS: The left IJ line is noted ending about the distal SVC. The lungs are hyperexpanded, with flattening of the hemidiaphragms, compatible with COPD. Mild chronic peribronchial thickening is noted. There is no evidence of focal opacification, pleural effusion or pneumothorax. Bilateral nipple shadows are seen. The cardiomediastinal silhouette is within normal limits. No acute osseous abnormalities are seen. IMPRESSION:  1. Left IJ line noted ending about the distal SVC. 2. Findings of COPD; lungs otherwise grossly clear. Electronically Signed   By: Garald Balding M.D.   On: 01/04/2015 06:15   I have personally reviewed and evaluated these images and lab results as part of my medical decision-making.   EKG Interpretation   Date/Time:  Friday January 03 2015 07:49:53 EST Ventricular Rate:  130 PR Interval:  129 QRS Duration: 127 QT Interval:  280 QTC Calculation: 412 R Axis:   83 Text Interpretation:  Sinus tachycardia Nonspecific intraventricular  conduction delay Probable anteroseptal infarct, old Repol abnrm suggests  ischemia, diffuse leads Minimal ST elevation, lateral leads Artifact in  lead(s) I II aVR aVL V1 and baseline wander in lead(s) II III aVF ED  PHYSICIAN INTERPRETATION AVAILABLE IN [facility] Confirmed by TEST,  Record ([id number]) on 01/04/2015 8:24:55 AM  MDM   Final diagnoses:  1. Abdominal pain 2. Pancreatitis 3. Alcohol Abuse    64yF with abdominal pain. Epigastric. Similar to pain she's previously had with pancreatitis. Probably what's causing her symptoms again today. She continues to drink alcohol. Lipase elevated. Treated symptoms with IV narcotics, antiemetics, IVF. Reports not much improvement of symptoms.    Of note, per nursing, pt's husband has called the ED twice and reported that when she is discharged with pancreatitis she has to go back to the hospital. If she is discharged today then he is going to call 911 to  have patient taken back to the hospital. Per review of records, she seems to have pancreatitis more frequently when arguing with husband.    Virgel Manifold, MD 01/05/15 [phone]

## 2015-01-03 NOTE — ED Notes (Signed)
Pt can go to floor at 13:18

## 2015-01-03 NOTE — Progress Notes (Signed)
Received pt from ED, alert and oriented, telemetry applied, VS obtained, oriented to unit, call light with in reach

## 2015-01-03 NOTE — H&P (Signed)
[facility] History and Physical  JAZZLYNN RAWE OIB:[id number] DOB: 1950/06/21 DOA: 01/03/2015  Referring physician: ED physician, Dr. Wilson Singer  PCP: Ricke Hey, MD   Chief Complaint: abd pain   HPI:  Pt is 64 y.o. Female with known history of substance abuse, alcohol and tobacco, narcotics, recurrent pancreatitis flares, COPD, presented to [facility] ED with main concern of abd pain, located mostly in upper abd quadrants, occasionally but not consistently radiating to entire abd, 10/10 in severity, worse with eating and with no specific alleviating factors. Please note that pt is somewhat of a poor historian and keeps asking for pain medication, saying she can't talk much due to pain. No family present at the bedside at this time. No reported chest pain or shortness of breath, no urinary concerns.   In ED, pt noted to be hemodynamically stable, VSS, lipase level > 300. TRH asked to admit for further evaluation.   Assessment and Plan: Active Problems: Acute alcoholic pancreatitis - admit for further evaluation - start with providing supportive care with IVF, analgesia and antiemetics as needed - keep NPO for now  - repeat lipase level in AM  Hyponatremia - likely from dehydration and poor oral intake - provide IVF and repeat BMP in AM  Hypercalcemia - from dehydration - IVF as noted above and repeat BMP in AM  Essential HTN - hold home medications for now until BP stabilizes, was 91/57 on admission   Alcohol abuse - monitor for signs of withdrawal - keep on CIWA protocol   Lovenox SQ for DVT prophylaxis   Severe PCM, underweight - Body mass index is 13.96 kg/(m^2).   Radiological Exams on Admission: No results found.   Code Status: Full Family Communication: Pt at bedside Disposition Plan: Admit for further evaluation    [facility] [phone]   Review of Systems:  Constitutional: Negative for fever, chills. Negative for diaphoresis.  HENT:  Negative for hearing loss, ear pain, nosebleeds, congestion, sore throat, neck pain, tinnitus and ear discharge.   Eyes: Negative for blurred vision, double vision, photophobia, pain, discharge and redness.  Respiratory: Negative for cough, hemoptysis, sputum production, shortness of breath, wheezing and stridor.   Cardiovascular: Negative for chest pain, palpitations, orthopnea, claudication and leg swelling.  Gastrointestinal: Per HPI Genitourinary: Negative for dysuria, urgency, frequency, hematuria and flank pain.  Musculoskeletal: Negative for myalgias, back pain, joint pain and falls.  Skin: Negative for itching and rash.  Neurological: Negative for dizziness and weakness. Endo/Heme/Allergies: Negative for environmental allergies and polydipsia. Does not bruise/bleed easily.  Psychiatric/Behavioral: Negative for suicidal ideas. The patient is not nervous/anxious.      Past Medical History  Diagnosis Date  . Pancreatitis   . COPD (chronic obstructive pulmonary disease) ([place])   . Substance abuse   . Cancer ([place])     renal ca  . Seizures ([place])   . Pancreatitis     Past Surgical History  Procedure Laterality Date  . Partial gastrectomy    . Kidney surgery      removed cancerous lesions    Social History:  reports that she has been smoking Cigarettes.  She has a 30 pack-year smoking history. She has never used smokeless tobacco. She reports that she drinks alcohol. She reports that she uses illicit drugs.  Allergies  Allergen Reactions  . Aspirin Other (See Comments)    bleeding  . Chlorpromazine Hcl Other (See Comments)    Muscle spasms  . Codeine Nausea Only    Family  History  Problem Relation Age of Onset  . CAD Mother   . Alcoholism Father   . COPD Father   . COPD Father     Prior to Admission medications   Medication Sig Start Date End Date Taking? Authorizing Provider  albuterol (PROVENTIL HFA;VENTOLIN HFA) 108 (90 BASE) MCG/ACT inhaler Inhale 2 puffs into  the lungs every 6 (six) hours as needed for wheezing or shortness of breath. 09/23/13  Yes Sherwood Gambler, MD  DULERA 200-5 MCG/ACT AERO INHALE 2 PUFFS INTO THE LUNGS TWICE DAILY 09/16/14  Yes Tammy S Parrett, NP  HYDROmorphone (DILAUDID) 4 MG tablet Take 1 tablet (4 mg total) by mouth every 6 (six) hours as needed for severe pain. 09/23/13  Yes Sherwood Gambler, MD  lisinopril (PRINIVIL,ZESTRIL) 10 MG tablet Take 1 tablet (10 mg total) by mouth daily. 06/06/13  Yes Costin Karlyne Greenspan, MD  LORazepam (ATIVAN) 1 MG tablet Take 1 tablet (1 mg total) by mouth every 6 (six) hours as needed. For anxiety. Patient taking differently: Take 1 mg by mouth every 6 (six) hours as needed for anxiety. For anxiety. 04/01/13  Yes Erline Hau, MD  ondansetron (ZOFRAN) 4 MG tablet Take 4 mg by mouth 3 (three) times daily as needed for nausea or vomiting.  05/09/14  Yes Historical Provider, MD  oxyCODONE-acetaminophen (PERCOCET) 10-325 MG tablet Take 1 tablet by mouth every 4 (four) hours as needed for pain. Patient not taking: Reported on 12/28/2014 11/16/14   Harvel Quale, MD  tiotropium (SPIRIVA HANDIHALER) 18 MCG inhalation capsule Place 1 capsule (18 mcg total) into inhaler and inhale daily. Patient not taking: Reported on 12/28/2014 12/26/14   Juanito Doom, MD    Physical Exam: Filed Vitals:   01/03/15 1030 01/03/15 1100 01/03/15 1130 01/03/15 1200  BP: 106/79 105/71 101/73 91/57  Pulse:  107 91 100  Temp:      TempSrc:      Resp: '15 19 13 15  '$ Height:      Weight:      SpO2: 100% 97% 99% 100%    Physical Exam  Constitutional: Appears calm and cachectic  HENT: Normocephalic. External right and left ear normal. Dry MM  Eyes: Conjunctivae and EOM are normal. PERRLA, no scleral icterus.  Neck: Normal ROM. Neck supple. No JVD. No tracheal deviation. No thyromegaly.  CVS: RRR, S1/S2 +, no gallops, no carotid bruit.  Pulmonary: Effort and breath sounds normal, no stridor, rhonchi, wheezes,  rales.  Abdominal: Soft. BS +,  no distension, tenderness in upper abd quadrants, no rebound or guarding.  Musculoskeletal: Normal range of motion. No edema and no tenderness.  Lymphadenopathy: No lymphadenopathy noted, cervical, inguinal. Neuro: Alert. Normal reflexes, muscle tone coordination. No cranial nerve deficit. Skin: Skin is warm and dry. No rash noted. Not diaphoretic. No erythema. No pallor.  Psychiatric: sleepy and in pain   Labs on Admission:  Basic Metabolic Panel:  Recent Labs Lab 12/28/14 2115 01/03/15 0800  NA 130* 131*  K 4.6 4.1  CL 97* 96*  CO2 20* 23  GLUCOSE 86 149*  BUN 10 9  CREATININE 0.71 0.98  CALCIUM 9.0 10.4*   Liver Function Tests:  Recent Labs Lab 12/28/14 2115 01/03/15 0800  AST 36 34  ALT 23 26  ALKPHOS 71 93  BILITOT 0.4 0.8  PROT 6.7 8.7*  ALBUMIN 3.7 4.9    Recent Labs Lab 12/28/14 2115 01/03/15 0800  LIPASE 32 381*   CBC:  Recent Labs Lab 12/28/14  2155 01/03/15 0822  WBC 8.2 7.4  HGB 12.9 14.1  HCT 36.8 41.5  MCV 91.8 92.0  PLT 179 167   Cardiac Enzymes:  Recent Labs Lab 12/28/14 2115  TROPONINI <0.03   EKG: pending    If 7PM-7AM, please contact night-coverage [email or web address] Password [email or web address] 01/03/2015, 12:41 PM

## 2015-01-04 ENCOUNTER — Inpatient Hospital Stay (HOSPITAL_COMMUNITY): Payer: Medicaid Other

## 2015-01-04 DIAGNOSIS — R1013 Epigastric pain: Secondary | ICD-10-CM

## 2015-01-04 DIAGNOSIS — Z720 Tobacco use: Secondary | ICD-10-CM

## 2015-01-04 DIAGNOSIS — K852 Alcohol induced acute pancreatitis without necrosis or infection: Secondary | ICD-10-CM

## 2015-01-04 DIAGNOSIS — E43 Unspecified severe protein-calorie malnutrition: Secondary | ICD-10-CM

## 2015-01-04 LAB — COMPREHENSIVE METABOLIC PANEL
ALBUMIN: 3.5 g/dL (ref 3.5–5.0)
ALK PHOS: 65 U/L (ref 38–126)
ALT: 19 U/L (ref 14–54)
ANION GAP: 10 (ref 5–15)
AST: 23 U/L (ref 15–41)
BUN: 7 mg/dL (ref 6–20)
CHLORIDE: 104 mmol/L (ref 101–111)
CO2: 22 mmol/L (ref 22–32)
Calcium: 9.2 mg/dL (ref 8.9–10.3)
Creatinine, Ser: 0.73 mg/dL (ref 0.44–1.00)
GFR calc non Af Amer: >60 mL/min (ref >60)
GLUCOSE: 77 mg/dL (ref 65–99)
POTASSIUM: 3.7 mmol/L (ref 3.5–5.1)
SODIUM: 136 mmol/L (ref 135–145)
Total Bilirubin: 0.7 mg/dL (ref 0.3–1.2)
Total Protein: 6.2 g/dL — ABNORMAL LOW (ref 6.5–8.1)

## 2015-01-04 LAB — CBC
HEMATOCRIT: 32 % — AB (ref 36.0–46.0)
HEMOGLOBIN: 10.8 g/dL — AB (ref 12.0–15.0)
MCH: 31.4 pg (ref 26.0–34.0)
MCHC: 33.8 g/dL (ref 30.0–36.0)
MCV: 93 fL (ref 78.0–100.0)
Platelets: 131 10*3/uL — ABNORMAL LOW (ref 150–400)
RBC: 3.44 MIL/uL — ABNORMAL LOW (ref 3.87–5.11)
RDW: 14 % (ref 11.5–15.5)
WBC: 5.4 10*3/uL (ref 4.0–10.5)

## 2015-01-04 LAB — LIPASE, BLOOD: LIPASE: 93 U/L — AB (ref 11–51)

## 2015-01-04 MED ORDER — NICOTINE 14 MG/24HR TD PT24
14.0000 mg | MEDICATED_PATCH | Freq: Every day | TRANSDERMAL | Status: DC
  Administered 2015-01-04 – 2015-01-05 (×2): 14 mg via TRANSDERMAL
  Filled 2015-01-04 (×2): qty 1

## 2015-01-04 MED ORDER — FAMOTIDINE IN NACL 20-0.9 MG/50ML-% IV SOLN
20.0000 mg | Freq: Two times a day (BID) | INTRAVENOUS | Status: DC
  Administered 2015-01-04 – 2015-01-05 (×3): 20 mg via INTRAVENOUS
  Filled 2015-01-04 (×3): qty 50

## 2015-01-04 MED ORDER — LORAZEPAM 1 MG PO TABS: 1.0000 mg | ORAL_TABLET | Freq: Four times a day (QID) | ORAL | Status: DC | PRN

## 2015-01-04 MED ORDER — MORPHINE SULFATE (PF) 2 MG/ML IV SOLN
2.0000 mg | INTRAVENOUS | Status: DC | PRN
  Administered 2015-01-04 – 2015-01-05 (×6): 2 mg via INTRAVENOUS
  Filled 2015-01-04 (×5): qty 1

## 2015-01-04 MED ORDER — MORPHINE SULFATE (PF) 2 MG/ML IV SOLN
2.0000 mg | INTRAVENOUS | Status: DC | PRN
  Filled 2015-01-04: qty 1

## 2015-01-04 MED ORDER — LORAZEPAM 2 MG/ML IJ SOLN
1.0000 mg | Freq: Four times a day (QID) | INTRAMUSCULAR | Status: DC | PRN
  Administered 2015-01-04 – 2015-01-05 (×3): 1 mg via INTRAVENOUS
  Filled 2015-01-04 (×3): qty 1

## 2015-01-04 MED ORDER — BOOST / RESOURCE BREEZE PO LIQD
1.0000 | Freq: Three times a day (TID) | ORAL | Status: DC
  Administered 2015-01-04 – 2015-01-05 (×3): 1 via ORAL

## 2015-01-04 MED ORDER — SODIUM CHLORIDE 0.9 % IJ SOLN
10.0000 mL | INTRAMUSCULAR | Status: DC | PRN
  Administered 2015-01-04: 10 mL
  Filled 2015-01-04: qty 40

## 2015-01-04 NOTE — Progress Notes (Signed)
RN called Radiologist to confirm the Placement of Central line and he said its ok to use it.

## 2015-01-04 NOTE — Progress Notes (Signed)
[facility] PROGRESS NOTE  [name] GNO:[id number] DOB: Dec 08, 1950 DOA: 01/03/2015 PCP: Ricke Hey, MD   Brief narrative 64 year old female with alcohol abuse, recurrent pancreatitis, COPD, severe malnutrition and ongoing tobacco use presented with abdominal pain in the epigastric area radiating to the back, 10/10 in severity worsened with meals. Patient found to have elevated lipase of 381. Admitted to hospitalist service for acute pancreatitis.   Assessment/Plan: Acute alcoholic pancreatitis Continue IV hydration, IV morphine every 3 hours as needed, when necessary antiemetics. Will start on clear liquid. Lipase improving. LFTs normal. -Reports drinking 2 beers every other day. Counseled on cessation. -Add Pepcid for associated gastritis symptoms.  Alcohol abuse Has  tremors on exam which she reports to be chronic. Monitor on CIWA. Continue thiamine, folate and multivitamin.  Hyponatremia Secondary to dehydration and poor by mouth intake. Monitor with IV fluids.  COPD Stable. Continue home inhalers. Counseled strongly on smoking cessation.   Essential hypertension Home blood pressure medications held due to soft blood pressure on presentation.  Severe protein calorie malnutrition Dietitian consulted.  Hematuria As seen on UA. We'll continue to monitor  Poor IV access Left IJ placed by PC CM on admission  DVT prophylaxis: Subcutaneous Lovenox Diet: Clear liquid  Code Status: Full code Family Communication: None at bedside Disposition Plan: Home once improved   Consultants:  None  Procedures:  None  Antibiotics:  None  HPI/Subjective: Seen and examined. Complains of abdominal pain. Denies nausea.  Objective: Filed Vitals:   01/03/15 1942 01/04/15 0634  BP: 95/62 151/94  Pulse: 77 89  Temp: 98 F (36.7 C) 98.2 F (36.8 C)  Resp: 19 18    Intake/Output Summary (Last 24 hours) at 01/04/15 1153 Last data filed at 01/04/15  1694  Gross per 24 hour  Intake 323.75 ml  Output      0 ml  Net 323.75 ml   Filed Weights   01/03/15 0752 01/03/15 1422  Weight: 36.288 kg (80 lb) 36.9 kg (81 lb 5.6 oz)    Exam:   General:  Elderly cachectic female not in distress  HEENT: No pallor, dry mucosa, supple neck, left IJ  Cardiovascular: Normal S1 and S2, no murmurs  Respiratory: Clear bilaterally  Abdomen: Soft, epigastric and. Medical tenderness, bowel sounds present, nondistended  Musculoskeletal: Warm, no edema,  CNS: Alert and oriented, tremors   Data Reviewed: Basic Metabolic Panel:  Recent Labs Lab 12/28/14 2115 01/03/15 0800 01/04/15 0630  NA 130* 131* 136  K 4.6 4.1 3.7  CL 97* 96* 104  CO2 20* 23 22  GLUCOSE 86 149* 77  BUN '10 9 7  '$ CREATININE 0.71 0.98 0.73  CALCIUM 9.0 10.4* 9.2  MG  --  1.9  --    Liver Function Tests:  Recent Labs Lab 12/28/14 2115 01/03/15 0800 01/04/15 0630  AST 36 34 23  ALT '23 26 19  '$ ALKPHOS 71 93 65  BILITOT 0.4 0.8 0.7  PROT 6.7 8.7* 6.2*  ALBUMIN 3.7 4.9 3.5    Recent Labs Lab 12/28/14 2115 01/03/15 0800 01/04/15 0630  LIPASE 32 381* 93*   No results for input(s): AMMONIA in the last 168 hours. CBC:  Recent Labs Lab 12/28/14 2155 01/03/15 0822 01/04/15 0630  WBC 8.2 7.4 5.4  HGB 12.9 14.1 10.8*  HCT 36.8 41.5 32.0*  MCV 91.8 92.0 93.0  PLT 179 167 131*   Cardiac Enzymes:  Recent Labs Lab 12/28/14 2115  TROPONINI <0.03   BNP (last 3 results) No results  for input(s): BNP in the last 8760 hours.  ProBNP (last 3 results) No results for input(s): PROBNP in the last 8760 hours.  CBG: No results for input(s): GLUCAP in the last 168 hours.  No results found for this or any previous visit (from the past 240 hour(s)).   Studies: Dg Chest Port 1 View  01/04/2015  CLINICAL DATA:  Central line placement.  Initial encounter. EXAM: PORTABLE CHEST 1 VIEW COMPARISON:  Chest radiograph performed 12/28/2014 FINDINGS: The left IJ line is  noted ending about the distal SVC. The lungs are hyperexpanded, with flattening of the hemidiaphragms, compatible with COPD. Mild chronic peribronchial thickening is noted. There is no evidence of focal opacification, pleural effusion or pneumothorax. Bilateral nipple shadows are seen. The cardiomediastinal silhouette is within normal limits. No acute osseous abnormalities are seen. IMPRESSION: 1. Left IJ line noted ending about the distal SVC. 2. Findings of COPD; lungs otherwise grossly clear. Electronically Signed   By: Garald Balding M.D.   On: 01/04/2015 06:15    Scheduled Meds: . enoxaparin (LOVENOX) injection  30 mg Subcutaneous Q24H  . famotidine (PEPCID) IV  20 mg Intravenous Q12H  . folic acid  1 mg Oral Daily  . mometasone-formoterol  2 puff Inhalation BID  . multivitamin with minerals  1 tablet Oral Daily  . sodium chloride  3 mL Intravenous Q12H  . thiamine  100 mg Oral Daily   Continuous Infusions: . sodium chloride Stopped (01/03/15 2100)     Time spent: 25 minutes    [name]  [facility] Pager [phone]. If 7PM-7AM, please contact night-coverage at [email or web address], password [facility] 01/04/2015, 11:53 AM  LOS: 1 day

## 2015-01-04 NOTE — Procedures (Signed)
Central Venous Catheter Insertion Procedure Note [name] [id number] [birth year]  Procedure: Insertion of Central Venous Catheter Indications: Drug and/or fluid administration  Procedure Details Consent: Risks of procedure as well as the alternatives and risks of each were explained to the (patient/caregiver).  Consent for procedure obtained. Time Out: Verified patient identification, verified procedure, site/side was marked, verified correct patient position, special equipment/implants available, medications/allergies/relevent history reviewed, required imaging and test results available.  Performed  Maximum sterile technique was used including antiseptics, cap, gloves, gown, hand hygiene, mask and sheet. Skin prep: Chlorhexidine; local anesthetic administered A antimicrobial bonded/coated triple lumen catheter was placed in the left internal jugular vein using the Seldinger technique.  After local anesthesia, the finder needle was advanced into the vessel under ultrasound guidance. Dark red non-pulsatile blood was aspirated. The wire was threaded into the vessel and needle removed. The wire was confirmed in the vein with ultrasound. A nick was made in the skin and the tract was dilated. The catheter was advanced over the wire into the vessel. The wire was removed. All ports aspirated and flushed easily. The catheter was sutured in place and biopatch and dressing were applied. No complications. Post procedure CXR as above. EBL <5cc.   Evaluation Blood flow good Complications: No apparent complications Patient did tolerate procedure well. Chest X-ray ordered to verify placement.  CXR: pending.  Dannielle Burn, MD 01/04/2015, 5:45 AM

## 2015-01-04 NOTE — Progress Notes (Signed)
Initial Nutrition Assessment  DOCUMENTATION CODES:   Severe malnutrition in context of chronic illness, Underweight  INTERVENTION:   -Provide Boost Breeze po TID, each supplement provides 250 kcal and 9 grams of protein -Encourage PO intake -RD to continue to monitor  NUTRITION DIAGNOSIS:   Malnutrition related to chronic illness as evidenced by severe depletion of body fat, severe depletion of muscle mass.  GOAL:   Patient will meet greater than or equal to 90% of their needs  MONITOR:   PO intake, Supplement acceptance, Diet advancement, Labs, Weight trends, Skin, I & O's  REASON FOR ASSESSMENT:   Malnutrition Screening Tool    ASSESSMENT:   64 year old female with alcohol abuse, recurrent pancreatitis, COPD, severe malnutrition and ongoing tobacco use presented with abdominal pain in the epigastric area radiating to the back, 10/10 in severity worsened with meals. Patient found to have elevated lipase of 381. Admitted to hospitalist service for acute pancreatitis.  Pt reports not eating for 4 days PTA. Pt has been ordered clear liquid diet, she has not had any liquids yet. Pt states she is feeling nauseous. Pt has lost 8 lb since 7/01 (9% weight loss x 6 months, insignificant for time frame). Pt does not like regular Ensure/Boost supplements but is willing to try clear liquid Boost. RD to order.  Nutrition-Focused physical exam completed. Findings are severe fat depletion, severe muscle depletion, and no edema.   Labs reviewed.  Diet Order:  Diet clear liquid Room service appropriate?: Yes; Fluid consistency:: Thin  Skin:  Reviewed, no issues  Last BM:  12/29  Height:   Ht Readings from Last 1 Encounters:  01/03/15 '5\' 4"'$  (1.626 m)    Weight:   Wt Readings from Last 1 Encounters:  01/03/15 81 lb 5.6 oz (36.9 kg)    Ideal Body Weight:  54.5 kg  BMI:  Body mass index is 13.96 kg/(m^2).  Estimated Nutritional Needs:   Kcal:  1300-1500  Protein:   60-70g  Fluid:  1.5L/day  EDUCATION NEEDS:   No education needs identified at this time  Clayton Bibles, MS, RD, LDN Pager: [phone] After Hours Pager: [phone]

## 2015-01-05 DIAGNOSIS — E871 Hypo-osmolality and hyponatremia: Secondary | ICD-10-CM

## 2015-01-05 LAB — BASIC METABOLIC PANEL
ANION GAP: 8 (ref 5–15)
BUN: <5 mg/dL — ABNORMAL LOW (ref 6–20)
CALCIUM: 8.7 mg/dL — AB (ref 8.9–10.3)
CO2: 23 mmol/L (ref 22–32)
Chloride: 109 mmol/L (ref 101–111)
Creatinine, Ser: 0.61 mg/dL (ref 0.44–1.00)
GLUCOSE: 89 mg/dL (ref 65–99)
POTASSIUM: 3.5 mmol/L (ref 3.5–5.1)
Sodium: 140 mmol/L (ref 135–145)

## 2015-01-05 LAB — LIPASE, BLOOD: Lipase: 48 U/L (ref 11–51)

## 2015-01-05 MED ORDER — HYDROMORPHONE HCL 4 MG PO TABS
4.0000 mg | ORAL_TABLET | Freq: Four times a day (QID) | ORAL | Status: DC | PRN
  Administered 2015-01-05: 4 mg via ORAL
  Filled 2015-01-05: qty 1

## 2015-01-05 MED ORDER — PANCRELIPASE (LIP-PROT-AMYL) 12000-38000 UNITS PO CPEP
12000.0000 [IU] | ORAL_CAPSULE | Freq: Three times a day (TID) | ORAL | Status: DC
  Filled 2015-01-05 (×3): qty 1

## 2015-01-05 MED ORDER — MORPHINE SULFATE (PF) 2 MG/ML IV SOLN
1.0000 mg | INTRAVENOUS | Status: DC | PRN
  Filled 2015-01-05: qty 1

## 2015-01-05 NOTE — Discharge Summary (Signed)
Physician Discharge Summary  [name] WJX:[id number] DOB: [birth year] DOA: 01/03/2015  PCP: Ricke Hey, MD  Admit date: 01/03/2015 Discharge date: 01/05/2015  Time spent: 25 minutes  Recommendations for Outpatient Follow-up:  1. Left AGAINST MEDICAL ADVICE. Please start patient on pancreatic enzyme supplements during outpatient follow-up   Discharge Diagnoses:  Active Problems:   Acute alcoholic pancreatitis  Active problems   Protein-calorie malnutrition, severe (HCC)   Tobacco abuse   Chronic alcoholic pancreatitis (HCC)   Abdominal pain   Hyponatremia   Hypocalcemia   Underweight   Discharge Condition: Left AGAINST MEDICAL ADVICE  Filed Weights   01/03/15 0752 01/03/15 1422  Weight: 36.288 kg (80 lb) 36.9 kg (81 lb 5.6 oz)    History of present illness:  65 year old female with alcohol abuse, recurrent pancreatitis, COPD, severe malnutrition and ongoing tobacco use presented with abdominal pain in the epigastric area radiating to the back, 10/10 in severity worsened with meals. Patient found to have elevated lipase of 381. Admitted to hospitalist service for acute pancreatitis.  Hospital Course:  Acute alcoholic pancreatitis Placed on IV morphine, IV fluids and antiemetics. Tolerating clear liquid and diet advanced to full liquid. -Reports drinking 2 beers every other day. Counseled on cessation. -Added Pepcid for associated gastritis symptoms. Added Creon as well -Patient had some tremors on exam which seems stable today. Monitored on CIWA and continue thiamine, folate and multivitamin. -This morning patient asking for more pain medications. On reviewing her home medications she is on Dilaudid 4 mg every 6 hours as needed for chronic pain and as needed Ativan for anxiety. I ordered these medications however patient wanted to go home, take her on pain medications and smoke and informed the nurse that she was tired of being in the hospital. Patient left AMA  before i could return and talk  to her.  Alcohol abuse Has tremors on exam which she reports to be chronic. Monitor on CIWA. Continue thiamine, folate and multivitamin.  Hyponatremia Secondary to dehydration and poor by mouth intake.   COPD Stable. Continued home inhalers and counseled on cessation.   Essential hypertension Home blood pressure medications held due to soft blood pressure on presentation.  Severe protein calorie malnutrition   Hematuria As seen on UA. Will  continue to monitor  Poor IV access Left IJ placed by PC CM on admission   Code Status: Full code Family Communication: Husband at bedside    Consultants:  None  Procedures:  None  Antibiotics:  None  Procedures:  CT abdomen and pelvis  Consultations:  None  Discharge Exam: Filed Vitals:   01/04/15 2307 01/05/15 0634  BP: 145/83 148/89  Pulse: 100 100  Temp: 98.1 F (36.7 C) 97.6 F (36.4 C)  Resp: 22 18    General: Elderly cachectic female not in distress, anxious and irritable HEENT: Moist mucosa Chest: Clear bilaterally CVS: Normal S1 and S2, no murmurs GI: Soft, nondistended, epigastric tenderness, bowel sounds present Musculoskeletal: Warm, no edema CNS: Alert and oriented    Discharge Instructions    Discharge Medication List as of 01/05/2015 11:28 AM    CONTINUE these medications which have NOT CHANGED   Details  albuterol (PROVENTIL HFA;VENTOLIN HFA) 108 (90 BASE) MCG/ACT inhaler Inhale 2 puffs into the lungs every 6 (six) hours as needed for wheezing or shortness of breath., Starting 09/23/2013, Until Discontinued, Print    DULERA 200-5 MCG/ACT AERO INHALE 2 PUFFS INTO THE LUNGS TWICE DAILY, Normal    HYDROmorphone (DILAUDID) 4 MG  tablet Take 1 tablet (4 mg total) by mouth every 6 (six) hours as needed for severe pain., Starting 09/23/2013, Until Discontinued, Print    lisinopril (PRINIVIL,ZESTRIL) 10 MG tablet Take 1 tablet (10 mg total) by mouth daily.,  Starting 06/06/2013, Until Discontinued, Print    LORazepam (ATIVAN) 1 MG tablet Take 1 tablet (1 mg total) by mouth every 6 (six) hours as needed. For anxiety., Starting 04/01/2013, Until Discontinued, Print    ondansetron (ZOFRAN) 4 MG tablet Take 4 mg by mouth 3 (three) times daily as needed for nausea or vomiting. , Starting 05/09/2014, Until Discontinued, Historical Med    oxyCODONE-acetaminophen (PERCOCET) 10-325 MG tablet Take 1 tablet by mouth every 4 (four) hours as needed for pain., Starting 11/16/2014, Until Discontinued, Print    tiotropium (SPIRIVA HANDIHALER) 18 MCG inhalation capsule Place 1 capsule (18 mcg total) into inhaler and inhale daily., Starting 12/26/2014, Until Discontinued, Normal       Allergies  Allergen Reactions  . Aspirin Other (See Comments)    bleeding  . Chlorpromazine Hcl Other (See Comments)    Muscle spasms  . Codeine Nausea Only      The results of significant diagnostics from this hospitalization (including imaging, microbiology, ancillary and laboratory) are listed below for reference.    Significant Diagnostic Studies: Dg Chest 2 View  12/28/2014  CLINICAL DATA:  Per pt, states she drank beer to help her breathe better-now having abdominal pain. When asked about chest complaints pt states she hurts all over. Current smoker, COPD. EXAM: CHEST - 2 VIEW COMPARISON:  11/16/2014 FINDINGS: Lungs hyperinflated, clear.  Heart size normal. No effusion.  No pneumothorax. Visualized skeletal structures are unremarkable. IMPRESSION: Hyperinflation without acute or superimposed abnormality. Electronically Signed   By: Lucrezia Europe M.D.   On: 12/28/2014 18:32   Dg Chest Port 1 View  01/04/2015  CLINICAL DATA:  Central line placement.  Initial encounter. EXAM: PORTABLE CHEST 1 VIEW COMPARISON:  Chest radiograph performed 12/28/2014 FINDINGS: The left IJ line is noted ending about the distal SVC. The lungs are hyperexpanded, with flattening of the hemidiaphragms,  compatible with COPD. Mild chronic peribronchial thickening is noted. There is no evidence of focal opacification, pleural effusion or pneumothorax. Bilateral nipple shadows are seen. The cardiomediastinal silhouette is within normal limits. No acute osseous abnormalities are seen. IMPRESSION: 1. Left IJ line noted ending about the distal SVC. 2. Findings of COPD; lungs otherwise grossly clear. Electronically Signed   By: Garald Balding M.D.   On: 01/04/2015 06:15    Microbiology: No results found for this or any previous visit (from the past 240 hour(s)).   Labs: Basic Metabolic Panel:  Recent Labs Lab 01/03/15 0800 01/04/15 0630 01/05/15 0605  NA 131* 136 140  K 4.1 3.7 3.5  CL 96* 104 109  CO2 '23 22 23  '$ GLUCOSE 149* 77 89  BUN 9 7 <5*  CREATININE 0.98 0.73 0.61  CALCIUM 10.4* 9.2 8.7*  MG 1.9  --   --    Liver Function Tests:  Recent Labs Lab 01/03/15 0800 01/04/15 0630  AST 34 23  ALT 26 19  ALKPHOS 93 65  BILITOT 0.8 0.7  PROT 8.7* 6.2*  ALBUMIN 4.9 3.5    Recent Labs Lab 01/03/15 0800 01/04/15 0630 01/05/15 0605  LIPASE 381* 93* 48   No results for input(s): AMMONIA in the last 168 hours. CBC:  Recent Labs Lab 01/03/15 0822 01/04/15 0630  WBC 7.4 5.4  HGB 14.1 10.8*  HCT  41.5 32.0*  MCV 92.0 93.0  PLT 167 131*   Cardiac Enzymes: No results for input(s): CKTOTAL, CKMB, CKMBINDEX, TROPONINI in the last 168 hours. BNP: BNP (last 3 results) No results for input(s): BNP in the last 8760 hours.  ProBNP (last 3 results) No results for input(s): PROBNP in the last 8760 hours.  CBG: No results for input(s): GLUCAP in the last 168 hours.     Signed:  Louellen Molder MD  FACP  [facility] 01/05/2015, 2:23 PM

## 2015-01-05 NOTE — Progress Notes (Signed)
Patient left against medical advise. Patient stated that she wanted to go home to take her own pain medicines, smoke cigarette, and that she was tired of being in the hospital. RN reviewed current medication schedule with pain, reminded pt that she has a nicotine patch on her arm. Pt continued to insist on leaving, Dr. Clementeen Graham was notified. AMA form was given and reviewed with patient.

## 2015-01-13 ENCOUNTER — Telehealth: Payer: Self-pay | Admitting: Adult Health

## 2015-01-13 MED ORDER — DOXYCYCLINE HYCLATE 100 MG PO TABS: 100.0000 mg | ORAL_TABLET | Freq: Two times a day (BID) | ORAL | Status: DC

## 2015-01-13 MED ORDER — PREDNISONE 10 MG PO TABS: ORAL_TABLET | ORAL | Status: DC

## 2015-01-13 NOTE — Telephone Encounter (Signed)
Doxycycline '100mg'$  po bid x5 days Prednisone Take '40mg'$  po daily for 3 days, then take '30mg'$  po daily for 3 days, then take '20mg'$  po daily for two days, then take '10mg'$  po daily for 2 days  BUT, this is very worrisome, so if not better in 24 hours go to ER

## 2015-01-13 NOTE — Telephone Encounter (Signed)
Spoke w/ Legrand Como. He reports pt was d/c'd from hospital 01/05/15. Pt is feeling very weak and d/t weather unable to come in the office for visit. He reports pt is coughing up lots of dark green phlem, wheezing, SOB w/ exertion, congestiong x couple days. Pt has been taking mucinex and robitussin. Please advise Dr. Lake Bells thanks

## 2015-01-13 NOTE — Telephone Encounter (Signed)
Spoke with pt's spouse, aware of recs.  rx sent to preferred pharmacy.  Nothing further needed.  

## 2015-01-13 NOTE — Telephone Encounter (Signed)
lmtcb for Ronalee Belts (pt's spouse) to make aware that we are still waiting on recs from provider.  He will receive a call back by the end of the day.

## 2015-01-13 NOTE — Telephone Encounter (Signed)
Patient's husband called back.  When I came back from placing him on brief hold he did not answer me.

## 2015-01-13 NOTE — Telephone Encounter (Signed)
Pt husband calling back concerned because he hasn't heard anything back from [place], I lethim know that we were waiting on reply from [place]

## 2015-02-13 ENCOUNTER — Emergency Department (HOSPITAL_COMMUNITY)
Admission: EM | Admit: 2015-02-13 | Discharge: 2015-02-13 | Disposition: A | Discharge status: Home / Self Care | Payer: Medicaid Other | Attending: Emergency Medicine | Admitting: Emergency Medicine

## 2015-02-13 ENCOUNTER — Encounter (HOSPITAL_COMMUNITY): Payer: Self-pay | Admitting: *Deleted

## 2015-02-13 DIAGNOSIS — F17210 Nicotine dependence, cigarettes, uncomplicated: Secondary | ICD-10-CM | POA: Insufficient documentation

## 2015-02-13 DIAGNOSIS — Z85528 Personal history of other malignant neoplasm of kidney: Secondary | ICD-10-CM | POA: Insufficient documentation

## 2015-02-13 DIAGNOSIS — J441 Chronic obstructive pulmonary disease with (acute) exacerbation: Secondary | ICD-10-CM | POA: Diagnosis not present

## 2015-02-13 DIAGNOSIS — Z8719 Personal history of other diseases of the digestive system: Secondary | ICD-10-CM | POA: Diagnosis not present

## 2015-02-13 DIAGNOSIS — R1010 Upper abdominal pain, unspecified: Secondary | ICD-10-CM | POA: Diagnosis not present

## 2015-02-13 DIAGNOSIS — R1013 Epigastric pain: Secondary | ICD-10-CM | POA: Diagnosis not present

## 2015-02-13 DIAGNOSIS — Z79899 Other long term (current) drug therapy: Secondary | ICD-10-CM | POA: Diagnosis not present

## 2015-02-13 LAB — URINALYSIS, ROUTINE W REFLEX MICROSCOPIC
Bilirubin Urine: NEGATIVE
Glucose, UA: NEGATIVE mg/dL
Ketones, ur: NEGATIVE mg/dL
Leukocytes, UA: NEGATIVE
Nitrite: NEGATIVE
Protein, ur: NEGATIVE mg/dL
Specific Gravity, Urine: 1.013 (ref 1.005–1.030)
pH: 6.5 (ref 5.0–8.0)

## 2015-02-13 LAB — URINE MICROSCOPIC-ADD ON

## 2015-02-13 LAB — CBC WITH DIFFERENTIAL/PLATELET
Basophils Absolute: 0 10*3/uL (ref 0.0–0.1)
Basophils Relative: 0 %
Eosinophils Absolute: 0.1 10*3/uL (ref 0.0–0.7)
Eosinophils Relative: 1 %
HCT: 35.5 % — ABNORMAL LOW (ref 36.0–46.0)
Hemoglobin: 11.8 g/dL — ABNORMAL LOW (ref 12.0–15.0)
Lymphocytes Relative: 17 %
Lymphs Abs: 1.4 10*3/uL (ref 0.7–4.0)
MCH: 31.3 pg (ref 26.0–34.0)
MCHC: 33.2 g/dL (ref 30.0–36.0)
MCV: 94.2 fL (ref 78.0–100.0)
Monocytes Absolute: 0.7 10*3/uL (ref 0.1–1.0)
Monocytes Relative: 8 %
Neutro Abs: 6 10*3/uL (ref 1.7–7.7)
Neutrophils Relative %: 74 %
Platelets: 229 10*3/uL (ref 150–400)
RBC: 3.77 MIL/uL — ABNORMAL LOW (ref 3.87–5.11)
RDW: 14.8 % (ref 11.5–15.5)
WBC: 8.2 10*3/uL (ref 4.0–10.5)

## 2015-02-13 LAB — COMPREHENSIVE METABOLIC PANEL
ALT: 14 U/L (ref 14–54)
AST: 22 U/L (ref 15–41)
Albumin: 3.9 g/dL (ref 3.5–5.0)
Alkaline Phosphatase: 81 U/L (ref 38–126)
Anion gap: 10 (ref 5–15)
BUN: 11 mg/dL (ref 6–20)
CO2: 21 mmol/L — ABNORMAL LOW (ref 22–32)
Calcium: 9.3 mg/dL (ref 8.9–10.3)
Chloride: 106 mmol/L (ref 101–111)
Creatinine, Ser: 0.83 mg/dL (ref 0.44–1.00)
GFR calc Af Amer: >60 mL/min (ref >60)
GFR calc non Af Amer: >60 mL/min (ref >60)
Glucose, Bld: 91 mg/dL (ref 65–99)
Potassium: 4.1 mmol/L (ref 3.5–5.1)
Sodium: 137 mmol/L (ref 135–145)
Total Bilirubin: 0.9 mg/dL (ref 0.3–1.2)
Total Protein: 6.9 g/dL (ref 6.5–8.1)

## 2015-02-13 LAB — LIPASE, BLOOD: Lipase: 23 U/L (ref 11–51)

## 2015-02-13 MED ORDER — SODIUM CHLORIDE 0.9 % IV BOLUS (SEPSIS): 500.0000 mL | Freq: Once | INTRAVENOUS | Status: DC

## 2015-02-13 MED ORDER — HYDROMORPHONE HCL 1 MG/ML IJ SOLN
1.0000 mg | Freq: Once | INTRAMUSCULAR | Status: AC
  Administered 2015-02-13: 1 mg via INTRAVENOUS
  Filled 2015-02-13: qty 1

## 2015-02-13 MED ORDER — PROMETHAZINE HCL 25 MG/ML IJ SOLN
12.5000 mg | Freq: Once | INTRAMUSCULAR | Status: AC
  Administered 2015-02-13: 12.5 mg via INTRAVENOUS
  Filled 2015-02-13: qty 1

## 2015-02-13 MED ORDER — ALBUTEROL SULFATE (2.5 MG/3ML) 0.083% IN NEBU
2.5000 mg | INHALATION_SOLUTION | Freq: Once | RESPIRATORY_TRACT | Status: AC
  Administered 2015-02-13: 2.5 mg via RESPIRATORY_TRACT
  Filled 2015-02-13: qty 3

## 2015-02-13 MED ORDER — HYDROMORPHONE HCL 1 MG/ML IJ SOLN
1.0000 mg | Freq: Once | INTRAMUSCULAR | Status: AC
  Administered 2015-02-13: 1 mg via INTRAMUSCULAR
  Filled 2015-02-13: qty 1

## 2015-02-13 NOTE — ED Notes (Signed)
Patient yelling that she can not give urine sample.

## 2015-02-13 NOTE — ED Notes (Addendum)
Patient is from home with history of pancreatitis and alcoholism. She consumed "a beer" last night with friends and began to have abdominal pain. She called EMS today due to continued abdominal pain. Patient states she is out of all of her narcotics at home but has a schedule appointment tomorrow and expects refills. Patient has extremely poor venous access and was given 46mg of fentanyly IM after failed attempts to establish IV by EMS. Patient acknowledges that her pain is the result of her alcohol consumption.

## 2015-02-13 NOTE — Discharge Instructions (Signed)

## 2015-02-18 NOTE — ED Provider Notes (Signed)
CSN: [id number]     Arrival date & time 02/13/15  1052 History   First MD Initiated Contact with Patient 02/13/15 1111     Chief Complaint  Patient presents with  . Abdominal Pain     (Consider location/radiation/quality/duration/timing/severity/associated sxs/prior Treatment) HPI   65yF with abdominal pain. Epigastric. Reports feels similar to previous times she has pancreatitis. Hx of ETOH abuse. Continues to drink.  Nausea. No vomiting. No fever or chills. No urinary complaints. Out of her pain medications and requesting prescription.  Occasional cough. Requesting breathing treatment.   Past Medical History  Diagnosis Date  . Pancreatitis   . COPD (chronic obstructive pulmonary disease) ([place])   . Substance abuse   . Cancer ([place])     renal ca  . Seizures ([place])   . Pancreatitis    Past Surgical History  Procedure Laterality Date  . Partial gastrectomy    . Kidney surgery      removed cancerous lesions   Family History  Problem Relation Age of Onset  . CAD Mother   . Alcoholism Father   . COPD Father   . COPD Father    Social History  Substance Use Topics  . Smoking status: Current Every Day Smoker -- 1.00 packs/day for 30 years    Types: Cigarettes  . Smokeless tobacco: Never Used  . Alcohol Use: 0.0 oz/week    0 Standard drinks or equivalent per week   OB History    No data available     Review of Systems  All systems reviewed and negative, other than as noted in HPI.   Allergies  Aspirin; Chlorpromazine hcl; and Codeine  Home Medications   Prior to Admission medications   Medication Sig Start Date End Date Taking? Authorizing Provider  albuterol (PROVENTIL HFA;VENTOLIN HFA) 108 (90 BASE) MCG/ACT inhaler Inhale 2 puffs into the lungs every 6 (six) hours as needed for wheezing or shortness of breath. 09/23/13  Yes Sherwood Gambler, MD  DULERA 200-5 MCG/ACT AERO INHALE 2 PUFFS INTO THE LUNGS TWICE DAILY 09/16/14  Yes Tammy S Parrett, NP  HYDROmorphone  (DILAUDID) 4 MG tablet Take 1 tablet (4 mg total) by mouth every 6 (six) hours as needed for severe pain. 09/23/13  Yes Sherwood Gambler, MD  lisinopril (PRINIVIL,ZESTRIL) 10 MG tablet Take 1 tablet (10 mg total) by mouth daily. 06/06/13  Yes Costin Karlyne Greenspan, MD  LORazepam (ATIVAN) 1 MG tablet Take 1 tablet (1 mg total) by mouth every 6 (six) hours as needed. For anxiety. Patient taking differently: Take 1 mg by mouth every 6 (six) hours as needed for anxiety. For anxiety. 04/01/13  Yes Erline Hau, MD  ondansetron (ZOFRAN) 4 MG tablet Take 4 mg by mouth 3 (three) times daily as needed for nausea or vomiting.  05/09/14  Yes Historical Provider, MD  doxycycline (VIBRA-TABS) 100 MG tablet Take 1 tablet (100 mg total) by mouth 2 (two) times daily. Patient not taking: Reported on 02/13/2015 01/13/15   Juanito Doom, MD  oxyCODONE-acetaminophen (PERCOCET) 10-325 MG tablet Take 1 tablet by mouth every 4 (four) hours as needed for pain. Patient not taking: Reported on 12/28/2014 11/16/14   Harvel Quale, MD  predniSONE (DELTASONE) 10 MG tablet '40mg'$ X3 days, '30mg'$  X3 days, '20mg'$  X2 days, '10mg'$ X2 days, then stop. Patient not taking: Reported on 02/13/2015 01/13/15   Juanito Doom, MD  tiotropium (SPIRIVA HANDIHALER) 18 MCG inhalation capsule Place 1 capsule (18 mcg total) into inhaler and inhale daily. Patient  not taking: Reported on 12/28/2014 12/26/14   Juanito Doom, MD   BP 152/87 mmHg  Pulse 105  Temp(Src) 98.2 F (36.8 C) (Oral)  Resp 20  SpO2 97% Physical Exam  Constitutional: She appears well-developed and well-nourished. No distress.  Laying in bed. Frail and chronically ill appearing.  HENT:  Head: Normocephalic and atraumatic.  Eyes: Conjunctivae are normal. Right eye exhibits no discharge. Left eye exhibits no discharge.  Neck: Neck supple.  Cardiovascular: Normal rate, regular rhythm and normal heart sounds.  Exam reveals no gallop and no friction rub.   No murmur  heard. Pulmonary/Chest: Effort normal. No respiratory distress. She has wheezes.  Wheezing but no respiratory distress.  Abdominal: Soft. She exhibits no distension. There is tenderness. There is no rebound.  Epigastric tenderness w/o rebound or guarding  Musculoskeletal: She exhibits no edema or tenderness.  Neurological: She is alert.  Skin: Skin is warm and dry.  Psychiatric: She has a normal mood and affect. Her behavior is normal. Thought content normal.  Nursing note and vitals reviewed.   ED Course  Procedures (including critical care time) Labs Review Labs Reviewed  COMPREHENSIVE METABOLIC PANEL - Abnormal; Notable for the following:    CO2 21 (*)    All other components within normal limits  URINALYSIS, ROUTINE W REFLEX MICROSCOPIC (NOT AT [facility]) - Abnormal; Notable for the following:    APPearance CLOUDY (*)    Hgb urine dipstick LARGE (*)    All other components within normal limits  CBC WITH DIFFERENTIAL/PLATELET - Abnormal; Notable for the following:    RBC 3.77 (*)    Hemoglobin 11.8 (*)    HCT 35.5 (*)    All other components within normal limits  URINE MICROSCOPIC-ADD ON - Abnormal; Notable for the following:    Squamous Epithelial / LPF 0-5 (*)    Bacteria, UA FEW (*)    All other components within normal limits  LIPASE, BLOOD    Imaging Review No results found. I have personally reviewed and evaluated these images and lab results as part of my medical decision-making.   EKG Interpretation None      MDM   Final diagnoses:  Epigastric pain    65 year old female with upper abdominal pain.Long-standing history of same. Reports feel similar to prior bouts of pancreatitis and continues to abuse alcohol. She has tenderness in epigastrium without peritoneal signs. Lipase is normal though andadditional labs fairly unremarkable. Treated symptomatically with improvement. It has been determined that no acute conditions requiring further emergency intervention  are present at this time. The patient has been advised of the diagnosis and plan. I reviewed any labs and imaging including any potential incidental findings. We have discussed signs and symptoms that warrant return to the ED and they are listed in the discharge instructions.      Virgel Manifold, MD 02/18/15 1255

## 2015-02-27 ENCOUNTER — Ambulatory Visit (INDEPENDENT_AMBULATORY_CARE_PROVIDER_SITE_OTHER): Payer: Medicaid Other | Admitting: Pulmonary Disease

## 2015-02-27 ENCOUNTER — Encounter: Payer: Self-pay | Admitting: Pulmonary Disease

## 2015-02-27 ENCOUNTER — Ambulatory Visit: Payer: Medicaid Other | Admitting: Pulmonary Disease

## 2015-02-27 VITALS — BP 116/74 | HR 94 | Ht 64.0 in | Wt 78.2 lb

## 2015-02-27 DIAGNOSIS — J449 Chronic obstructive pulmonary disease, unspecified: Secondary | ICD-10-CM

## 2015-02-27 DIAGNOSIS — Z720 Tobacco use: Secondary | ICD-10-CM | POA: Diagnosis not present

## 2015-02-27 MED ORDER — MOMETASONE FURO-FORMOTEROL FUM 200-5 MCG/ACT IN AERO: INHALATION_SPRAY | RESPIRATORY_TRACT | Status: DC

## 2015-02-27 MED ORDER — TIOTROPIUM BROMIDE MONOHYDRATE 18 MCG IN CAPS: 18.0000 ug | ORAL_CAPSULE | Freq: Every day | RESPIRATORY_TRACT | Status: DC

## 2015-02-27 MED ORDER — ALBUTEROL SULFATE HFA 108 (90 BASE) MCG/ACT IN AERS: 2.0000 | INHALATION_SPRAY | Freq: Four times a day (QID) | RESPIRATORY_TRACT | Status: DC | PRN

## 2015-02-27 NOTE — Patient Instructions (Signed)
Stop smoking Keep taking your medications We'll see you back in 6 months or sooner if needed

## 2015-02-27 NOTE — Progress Notes (Signed)
   Subjective:    Patient ID: [name], female    DOB: Jul 25, 1950, 65 y.o.   MRN: [id number]  Synopsis: Gold grade 4 COPD PFT 07/17/2014 FEV1 0.95 ((36%)  Ratio 49 p 25% resp to saba  DLCO 49% Still smoking as of 09/02/2014    HPI Chief Complaint  Patient presents with  . Follow-up    pt c/o sob, fatigue with any exertion, prod cough with yellow mucus.     Elis has been doing about the same.  Still has shortness of breath.  It really hasn't gotten any worse but it hasn't gotten better. She was hospitalized for her pancreatitis but no respiratory flare up.  She still has Dulera and Spiriva.  She notes that she is still smoking 1 1/2 packs per day.   Past Medical History  Diagnosis Date  . Pancreatitis   . COPD (chronic obstructive pulmonary disease) ([place])   . Substance abuse   . Cancer ([place])     renal ca  . Seizures ([place])   . Pancreatitis       Review of Systems     Objective:   Physical Exam Filed Vitals:   02/27/15 1331  BP: 116/74  Pulse: 94  Height: '5\' 4"'$  (1.626 m)  Weight: 35.471 kg (78 lb 3.2 oz)  SpO2: 95%   RA  Gen: frail, then, chronically ill appearing HENT: OP clear, , neck supple PULM: Wheezing only with rare cough, limited air movement CV: RRR, no mgr, trace edema GI: BS+, soft, nontender Derm: no cyanosis or rash Psyche: normal mood and affect         Assessment & Plan:  Tobacco abuse Counseled at length to quit smoking today. Educated on the fact that it causes her COPD to worsen.  COPD GOLD III with reversible component  As above, counseled at length to quit smoking today. She has severe COPD and continues to smokes cigarettes. Her symptoms have not changed. She is on maximum medical therapy. There is no role for any additional medical therapy at this time. She just needs to quit smoking.  Plan: Counseled quit smoking Continued Dulera and Spiriva Follow-up 6 months or sooner if needed     Current outpatient  prescriptions:  .  albuterol (PROVENTIL HFA;VENTOLIN HFA) 108 (90 Base) MCG/ACT inhaler, Inhale 2 puffs into the lungs every 6 (six) hours as needed for wheezing or shortness of breath., Disp: 1 Inhaler, Rfl: 5 .  HYDROmorphone (DILAUDID) 4 MG tablet, Take 1 tablet (4 mg total) by mouth every 6 (six) hours as needed for severe pain., Disp: 10 tablet, Rfl: 0 .  lisinopril (PRINIVIL,ZESTRIL) 10 MG tablet, Take 1 tablet (10 mg total) by mouth daily., Disp: 30 tablet, Rfl: 0 .  LORazepam (ATIVAN) 1 MG tablet, Take 1 tablet (1 mg total) by mouth every 6 (six) hours as needed. For anxiety. (Patient taking differently: Take 1 mg by mouth every 6 (six) hours as needed for anxiety. For anxiety.), Disp: 10 tablet, Rfl: 0 .  mometasone-formoterol (DULERA) 200-5 MCG/ACT AERO, INHALE 2 PUFFS INTO THE LUNGS TWICE DAILY, Disp: 13 g, Rfl: 5 .  ondansetron (ZOFRAN) 4 MG tablet, Take 4 mg by mouth 3 (three) times daily as needed for nausea or vomiting. , Disp: , Rfl: 0 .  tiotropium (SPIRIVA HANDIHALER) 18 MCG inhalation capsule, Place 1 capsule (18 mcg total) into inhaler and inhale daily., Disp: 30 capsule, Rfl: 5

## 2015-02-27 NOTE — Assessment & Plan Note (Signed)
As above, counseled at length to quit smoking today. She has severe COPD and continues to smokes cigarettes. Her symptoms have not changed. She is on maximum medical therapy. There is no role for any additional medical therapy at this time. She just needs to quit smoking.  Plan: Counseled quit smoking Continued Dulera and Spiriva Follow-up 6 months or sooner if needed

## 2015-02-27 NOTE — Assessment & Plan Note (Signed)
Counseled at length to quit smoking today. Educated on the fact that it causes her COPD to worsen.

## 2015-04-15 ENCOUNTER — Other Ambulatory Visit (HOSPITAL_COMMUNITY): Payer: Self-pay | Admitting: Interventional Radiology

## 2015-04-15 DIAGNOSIS — N2889 Other specified disorders of kidney and ureter: Secondary | ICD-10-CM

## 2015-04-16 ENCOUNTER — Other Ambulatory Visit (HOSPITAL_COMMUNITY): Payer: Self-pay | Admitting: Interventional Radiology

## 2015-04-16 DIAGNOSIS — C642 Malignant neoplasm of left kidney, except renal pelvis: Secondary | ICD-10-CM

## 2015-04-16 DIAGNOSIS — N2889 Other specified disorders of kidney and ureter: Secondary | ICD-10-CM

## 2015-05-02 ENCOUNTER — Other Ambulatory Visit: Payer: Self-pay | Admitting: Radiology

## 2015-05-05 ENCOUNTER — Ambulatory Visit (HOSPITAL_COMMUNITY)
Admission: RE | Admit: 2015-05-05 | Discharge: 2015-05-05 | Disposition: A | Discharge status: Home / Self Care | Payer: Medicaid Other | Source: Ambulatory Visit | Attending: Interventional Radiology | Admitting: Interventional Radiology

## 2015-05-05 ENCOUNTER — Other Ambulatory Visit (HOSPITAL_COMMUNITY): Payer: Self-pay | Admitting: Interventional Radiology

## 2015-05-05 ENCOUNTER — Encounter (HOSPITAL_COMMUNITY): Payer: Self-pay

## 2015-05-05 DIAGNOSIS — N2889 Other specified disorders of kidney and ureter: Secondary | ICD-10-CM | POA: Diagnosis present

## 2015-05-05 DIAGNOSIS — Z0189 Encounter for other specified special examinations: Secondary | ICD-10-CM | POA: Insufficient documentation

## 2015-05-05 DIAGNOSIS — K838 Other specified diseases of biliary tract: Secondary | ICD-10-CM | POA: Diagnosis not present

## 2015-05-05 LAB — POCT I-STAT CREATININE: Creatinine, Ser: 0.8 mg/dL (ref 0.44–1.00)

## 2015-05-05 MED ORDER — IOPAMIDOL (ISOVUE-300) INJECTION 61%
100.0000 mL | Freq: Once | INTRAVENOUS | Status: AC | PRN
  Administered 2015-05-05: 80 mL via INTRAVENOUS

## 2015-05-05 MED ORDER — LIDOCAINE HCL 1 % IJ SOLN
INTRAMUSCULAR | Status: AC
  Filled 2015-05-05: qty 20

## 2015-05-05 NOTE — Procedures (Signed)
Interventional Radiology Procedure Note  Procedure:  Left PICC line placement  Complications:  None  Estimated Blood Loss: < 10 mL  22 cm SL PICC on left to SVC origin.  OK to use for CT contrast injection.  Venetia Night. Kathlene Cote, M.D Pager:  [phone]

## 2015-05-08 ENCOUNTER — Ambulatory Visit
Admission: RE | Admit: 2015-05-08 | Discharge: 2015-05-08 | Disposition: A | Discharge status: Home / Self Care | Payer: Medicaid Other | Source: Ambulatory Visit | Attending: Interventional Radiology | Admitting: Interventional Radiology

## 2015-05-08 DIAGNOSIS — C642 Malignant neoplasm of left kidney, except renal pelvis: Secondary | ICD-10-CM

## 2015-05-08 HISTORY — PX: IR GENERIC HISTORICAL: IMG1180011

## 2015-05-08 NOTE — Progress Notes (Signed)
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  History of Present Illness: [name] is a 65 y.o. female here for follow-up 5 years status post most recent cryoablation of a left renal cell carcinoma after recurrence from original radiofrequency ablation. She denies any urinary symptoms. She has managed to stay out of the hospital for 4 months. She has chronic loss of appetite and has lost some weight recently. She denies any abdominal pain currently.  Past Medical History  Diagnosis Date  . Pancreatitis   . COPD (chronic obstructive pulmonary disease) ([place])   . Substance abuse   . Cancer ([place])     renal ca  . Seizures ([place])   . Pancreatitis     Past Surgical History  Procedure Laterality Date  . Partial gastrectomy    . Kidney surgery      removed cancerous lesions    Allergies: Aspirin and Chlorpromazine hcl  Medications: Prior to Admission medications   Medication Sig Start Date End Date Taking? Authorizing Provider  albuterol (PROVENTIL HFA;VENTOLIN HFA) 108 (90 Base) MCG/ACT inhaler Inhale 2 puffs into the lungs every 6 (six) hours as needed for wheezing or shortness of breath. 02/27/15  Yes Juanito Doom, MD  HYDROmorphone (DILAUDID) 4 MG tablet Take 1 tablet (4 mg total) by mouth every 6 (six) hours as needed for severe pain. 09/23/13  Yes Sherwood Gambler, MD  lisinopril (PRINIVIL,ZESTRIL) 10 MG tablet Take 1 tablet (10 mg total) by mouth daily. 06/06/13  Yes Costin Karlyne Greenspan, MD  LORazepam (ATIVAN) 1 MG tablet Take 1 tablet (1 mg total) by mouth every 6 (six) hours as needed. For anxiety. Patient taking differently: Take 1 mg by mouth every 6 (six) hours as needed for anxiety. For anxiety. 04/01/13  Yes Estela Leonie Green, MD  mometasone-formoterol [facility]) 200-5 MCG/ACT AERO INHALE 2 PUFFS INTO THE LUNGS TWICE DAILY 02/27/15  Yes Juanito Doom, MD  ondansetron (ZOFRAN) 4 MG tablet Take 4 mg by mouth 3 (three) times daily as needed for nausea or vomiting.  05/09/14  Yes Historical Provider, MD  tiotropium (SPIRIVA HANDIHALER) 18 MCG inhalation capsule Place 1 capsule (18 mcg total) into inhaler and inhale daily. 02/27/15  Yes Juanito Doom, MD     Family History  Problem Relation Age of Onset  . CAD Mother   . Alcoholism Father   . COPD Father   . COPD Father     Social History   Social History  . Marital Status: Married    Spouse Name: N/A  . Number of Children: N/A  . Years of Education: N/A   Social History Main Topics  . Smoking status: Current Every Day Smoker -- 1.00 packs/day for 30 years    Types: Cigarettes  . Smokeless tobacco: Never Used     Comment: smoking up to 1.5 ppd per husband.   . Alcohol Use: 0.0 oz/week    0 Standard drinks or equivalent per week  . Drug Use: Yes     Comment: Hx of polysubstance drug abuse  . Sexual Activity: No   Other Topics Concern  . Not on file   Social History Narrative    Review of Systems: A 12 point ROS discussed and pertinent positives are indicated in the HPI above.  All other systems are negative.  Review of Systems  Constitutional: Negative.   Respiratory: Negative.   Cardiovascular: Negative.  Gastrointestinal: Negative.   Genitourinary: Negative.   Musculoskeletal: Negative.   Neurological: Negative.      Vital Signs: BP 149/87 mmHg  Pulse 106  Temp(Src) 98.2 F (36.8 C) (Oral)  Resp 16  Ht '5\' 4"'$  (1.626 m)  Wt 80 lb (36.288 kg)  BMI 13.73 kg/m2  SpO2 90%  Physical Exam  Constitutional: She is oriented to person, place, and time. No distress.  Abdominal: Soft. She exhibits no distension and no mass. There is no tenderness. There is no rebound and no guarding.  Musculoskeletal: She exhibits no edema.  Neurological: She is alert and oriented to person, place, and time.  Skin: She is not diaphoretic.  Nursing note and vitals  reviewed.   Mallampati Score:     Imaging: Ir Fluoro Guide Cv Line Left  05/05/2015  CLINICAL DATA:  Lack of peripheral IV access and need for IV access prior to contrast enhanced CT of the abdomen. EXAM: PICC LINE PLACEMENT WITH ULTRASOUND AND FLUOROSCOPIC GUIDANCE FLUOROSCOPY TIME:  18 seconds. PROCEDURE: The patient was advised of the possible risks and complications and agreed to undergo the procedure. The patient was then brought to the angiographic suite for the procedure. A time-out was performed prior to initiating the procedure. Both arms were prepped with chlorhexidine, draped in the usual sterile fashion using maximum barrier technique (cap and mask, sterile gown, sterile gloves, large sterile sheet, hand hygiene and cutaneous antisepsis) and infiltrated locally with 1% Lidocaine. Ultrasound demonstrated patency of the right brachial vein, and this was documented with an image. Under real-time ultrasound guidance, this vein was accessed with a 21 gauge micropuncture needle and image documentation was performed. Initial attempt was made to advance a 5 French PICC line via the right brachial vein over a guidewire. Ultimately, the left arm was utilized with patency documented of the left basilic vein. After access, a 22 cm single-lumen power injectable PICC line was advanced through a peel-away sheath to the level of the proximal SVC. Fluoroscopy during the procedure and fluoro spot radiograph confirms appropriate catheter position. The catheter was flushed and covered with a sterile dressing. COMPLICATIONS: None FINDINGS: Due to stricture of the right axillary vein, a right-sided PICC line could not be successfully advanced into the central veins. There was also no significant blood return at the level of the right axillary vein on the right to utilize shorter positioned catheter for power injection of IV contrast. After left basilic vein access in the upper arm, a PICC line was able to be advanced  to the upper SVC. IMPRESSION: Successful left arm power injectable PICC line placement with ultrasound and fluoroscopic guidance. The catheter is ready for use. 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 Future PICC line placement via the upper extremities should be on the left side. Electronically Signed   By: Aletta Edouard M.D.   On: 05/05/2015 09:27   Ir US Guide Vasc Access Left  05/05/2015  CLINICAL DATA:  Lack of peripheral IV access and need for IV access prior to contrast enhanced CT of the abdomen. EXAM: PICC LINE PLACEMENT WITH ULTRASOUND AND FLUOROSCOPIC GUIDANCE FLUOROSCOPY TIME:  18 seconds. PROCEDURE: The patient was advised of the possible risks and complications and agreed to undergo the procedure. The patient was then brought to the angiographic suite for the procedure. A time-out was performed prior to initiating the procedure. Both arms were prepped with chlorhexidine, draped in the usual sterile fashion using maximum barrier technique (cap and mask,  sterile gown, sterile gloves, large sterile sheet, hand hygiene and cutaneous antisepsis) and infiltrated locally with 1% Lidocaine. Ultrasound demonstrated patency of the right brachial vein, and this was documented with an image. Under real-time ultrasound guidance, this vein was accessed with a 21 gauge micropuncture needle and image documentation was performed. Initial attempt was made to advance a 5 French PICC line via the right brachial vein over a guidewire. Ultimately, the left arm was utilized with patency documented of the left basilic vein. After access, a 22 cm single-lumen power injectable PICC line was advanced through a peel-away sheath to the level of the proximal SVC. Fluoroscopy during the procedure and fluoro spot radiograph confirms appropriate catheter position. The catheter was flushed and covered with a sterile dressing. COMPLICATIONS: None FINDINGS: Due to stricture of the right  axillary vein, a right-sided PICC line could not be successfully advanced into the central veins. There was also no significant blood return at the level of the right axillary vein on the right to utilize shorter positioned catheter for power injection of IV contrast. After left basilic vein access in the upper arm, a PICC line was able to be advanced to the upper SVC. IMPRESSION: Successful left arm power injectable PICC line placement with ultrasound and fluoroscopic guidance. The catheter is ready for use. 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 Future PICC line placement via the upper extremities should be on the left side. Electronically Signed   By: Aletta Edouard M.D.   On: 05/05/2015 09:27   Ct Abd Wo & W Cm  05/05/2015  CLINICAL DATA:  Patient with history of left renal cell carcinoma status post cryoablation. Follow-up evaluation. EXAM: CT ABDOMEN WITHOUT AND WITH CONTRAST TECHNIQUE: Multidetector CT imaging of the abdomen was performed following the standard protocol before and following the bolus administration of intravenous contrast. CONTRAST:  72m ISOVUE-300 IOPAMIDOL (ISOVUE-300) INJECTION 61% COMPARISON:  CT abdomen 12/11/2013. FINDINGS: Lower chest: Lung bases are clear. Normal heart size. No pleural effusion. Hepatobiliary: The liver is normal in size and contour. No focal lesion identified. On the arterial phase imaging there is hyperenhancement about the central liver and gallbladder fossa, likely transient perfusional anomaly as no definitive lesion is identified on additional sequences. Mild central intrahepatic biliary ductal dilatation, grossly stable when compared to prior. The common bile duct is prominent measuring approximately 8 mm. Pancreas: Unremarkable Spleen: Unremarkable Adrenals/Urinary Tract: The adrenal glands are normal. Kidneys enhance symmetrically with contrast. Re- demonstrated cryoablation defect along the medial aspect of the  inferior pole of the left kidney measuring 2.4 x 1.6 cm, previously 2.4 x 1.8 cm at the same level (image 40; series 6). No significant postcontrast enhancement is identified. The left renal artery and vein are patent. Stomach/Bowel: No abnormal bowel wall thickening or evidence for bowel obstruction. No free fluid or free intraperitoneal air. Vascular/Lymphatic: Normal caliber abdominal aorta. Peripheral calcified atherosclerotic plaque. No retroperitoneal lymphadenopathy. Other: None. Musculoskeletal: Lumbar spine degenerative changes. No aggressive or acute appearing osseous lesions. IMPRESSION: No significant interval change in appearance of cryoablation site along the medial inferior pole of the left kidney. No imaging evidence to suggest localized recurrence. Mild central intrahepatic biliary ductal dilatation with prominence of the common bile duct measuring up to 8 mm. Consider correlation with LFTs. Electronically Signed   By: DLovey NewcomerM.D.   On: 05/05/2015 10:59    Labs:  CBC:  Recent Labs  12/28/14 2155 01/03/15 0822 01/04/15 0630 02/13/15 1212  WBC  8.2 7.4 5.4 8.2  HGB 12.9 14.1 10.8* 11.8*  HCT 36.8 41.5 32.0* 35.5*  PLT 179 167 131* 229    COAGS: No results for input(s): INR, APTT in the last 8760 hours.  BMP:  Recent Labs  01/03/15 0800 01/04/15 0630 01/05/15 0605 02/13/15 1212 05/05/15 0905  NA 131* 136 140 137  --   K 4.1 3.7 3.5 4.1  --   CL 96* 104 109 106  --   CO2 '23 22 23 '$ 21*  --   GLUCOSE 149* 77 89 91  --   BUN 9 7 <5* 11  --   CALCIUM 10.4* 9.2 8.7* 9.3  --   CREATININE 0.98 0.73 0.61 0.83 0.80  GFRNONAA >60 >60 >60 >60  --   GFRAA >60 >60 >60 >60  --     LIVER FUNCTION TESTS:  Recent Labs  12/28/14 2115 01/03/15 0800 01/04/15 0630 02/13/15 1212  BILITOT 0.4 0.8 0.7 0.9  AST 36 34 23 22  ALT '23 26 19 14  '$ ALKPHOS 71 93 65 81  PROT 6.7 8.7* 6.2* 6.9  ALBUMIN 3.7 4.9 3.5 3.9    Assessment and Plan:  Renal function is stable and  normal. I reviewed the latest follow-up CT on 05/05/2015 with Kennyth Lose and her husband. This demonstrates a stable ablation defect along the medial aspect of the left lower kidney which is partially calcified. There is no evidence of enhancement after administration of IV contrast to suggest tumor recurrence. No new renal lesions are identified. With lack of recurrence 5 years status post the most recent ablation procedure, I have recommended that we discontinue routine follow-up imaging of the ablation site. Imaging would only be necessary if she develops any new urinary symptoms or significant flank pain. She is agreeable to discontinue routine follow-up imaging.  Electronically SignedAletta Edouard T 05/08/2015, 9:24 AM   I spent a total of 15 Minutes in face to face in clinical consultation, greater than 50% of which was counseling/coordinating care post ablation of a left renal carcinoma.

## 2015-06-24 ENCOUNTER — Encounter (HOSPITAL_COMMUNITY): Payer: Self-pay | Admitting: Emergency Medicine

## 2015-06-24 ENCOUNTER — Emergency Department (HOSPITAL_COMMUNITY)
Admission: EM | Admit: 2015-06-24 | Discharge: 2015-06-25 | Discharge status: Against Medical Advice | Payer: Medicaid Other | Attending: Emergency Medicine | Admitting: Emergency Medicine

## 2015-06-24 ENCOUNTER — Other Ambulatory Visit: Payer: Self-pay

## 2015-06-24 ENCOUNTER — Emergency Department (HOSPITAL_COMMUNITY): Payer: Medicaid Other

## 2015-06-24 DIAGNOSIS — J449 Chronic obstructive pulmonary disease, unspecified: Secondary | ICD-10-CM | POA: Insufficient documentation

## 2015-06-24 DIAGNOSIS — Z85528 Personal history of other malignant neoplasm of kidney: Secondary | ICD-10-CM | POA: Insufficient documentation

## 2015-06-24 DIAGNOSIS — Z79899 Other long term (current) drug therapy: Secondary | ICD-10-CM | POA: Diagnosis not present

## 2015-06-24 DIAGNOSIS — R112 Nausea with vomiting, unspecified: Secondary | ICD-10-CM | POA: Insufficient documentation

## 2015-06-24 DIAGNOSIS — R109 Unspecified abdominal pain: Secondary | ICD-10-CM

## 2015-06-24 DIAGNOSIS — F17210 Nicotine dependence, cigarettes, uncomplicated: Secondary | ICD-10-CM | POA: Insufficient documentation

## 2015-06-24 DIAGNOSIS — R1013 Epigastric pain: Secondary | ICD-10-CM | POA: Insufficient documentation

## 2015-06-24 LAB — COMPREHENSIVE METABOLIC PANEL
ALK PHOS: 70 U/L (ref 38–126)
ALT: 21 U/L (ref 14–54)
ANION GAP: 9 (ref 5–15)
AST: 28 U/L (ref 15–41)
Albumin: 4.2 g/dL (ref 3.5–5.0)
BILIRUBIN TOTAL: 0.7 mg/dL (ref 0.3–1.2)
BUN: 11 mg/dL (ref 6–20)
CALCIUM: 9.5 mg/dL (ref 8.9–10.3)
CO2: 24 mmol/L (ref 22–32)
Chloride: 101 mmol/L (ref 101–111)
Creatinine, Ser: 0.98 mg/dL (ref 0.44–1.00)
GFR calc non Af Amer: >60 mL/min (ref >60)
Glucose, Bld: 99 mg/dL (ref 65–99)
Potassium: 3.7 mmol/L (ref 3.5–5.1)
Sodium: 134 mmol/L — ABNORMAL LOW (ref 135–145)
TOTAL PROTEIN: 7.4 g/dL (ref 6.5–8.1)

## 2015-06-24 LAB — CBC
HCT: 36.6 % (ref 36.0–46.0)
HEMOGLOBIN: 12.5 g/dL (ref 12.0–15.0)
MCH: 29.9 pg (ref 26.0–34.0)
MCHC: 34.2 g/dL (ref 30.0–36.0)
MCV: 87.6 fL (ref 78.0–100.0)
Platelets: 212 10*3/uL (ref 150–400)
RBC: 4.18 MIL/uL (ref 3.87–5.11)
RDW: 14.9 % (ref 11.5–15.5)
WBC: 6.6 10*3/uL (ref 4.0–10.5)

## 2015-06-24 LAB — LIPASE, BLOOD: Lipase: 23 U/L (ref 11–51)

## 2015-06-24 MED ORDER — SODIUM CHLORIDE 0.9 % IV BOLUS (SEPSIS)
1000.0000 mL | Freq: Once | INTRAVENOUS | Status: AC
  Administered 2015-06-24: 1000 mL via INTRAVENOUS

## 2015-06-24 MED ORDER — ONDANSETRON HCL 4 MG/2ML IJ SOLN
4.0000 mg | Freq: Once | INTRAMUSCULAR | Status: AC
  Administered 2015-06-24: 4 mg via INTRAVENOUS
  Filled 2015-06-24: qty 2

## 2015-06-24 MED ORDER — HYDROMORPHONE HCL 1 MG/ML IJ SOLN
1.0000 mg | Freq: Once | INTRAMUSCULAR | Status: AC
Start: 2015-06-24 — End: 2015-06-24
  Administered 2015-06-24: 1 mg via INTRAVENOUS
  Filled 2015-06-24: qty 1

## 2015-06-24 MED ORDER — SODIUM CHLORIDE 0.9 % IV BOLUS (SEPSIS): 1000.0000 mL | Freq: Once | INTRAVENOUS | Status: DC

## 2015-06-24 NOTE — ED Provider Notes (Signed)
CSN: [id number]     Arrival date & time 06/24/15  1913 History   First MD Initiated Contact with Patient 06/24/15 2128     Chief Complaint  Patient presents with  . Abdominal Pain     (Consider location/radiation/quality/duration/timing/severity/associated sxs/prior Treatment) Patient is a 65 y.o. female presenting with abdominal pain.  Abdominal Pain Pain location:  Epigastric Pain quality: aching and sharp   Pain radiates to:  Does not radiate Pain severity:  Mild Timing:  Constant Chronicity:  New Context: alcohol use   Relieved by:  None tried Worsened by:  Nothing tried Ineffective treatments:  None tried Associated symptoms: nausea and vomiting   Associated symptoms: no anorexia, no dysuria and no fatigue     Past Medical History  Diagnosis Date  . Pancreatitis   . COPD (chronic obstructive pulmonary disease) ([place])   . Substance abuse   . Cancer ([place])     renal ca  . Seizures ([place])   . Pancreatitis    Past Surgical History  Procedure Laterality Date  . Partial gastrectomy    . Kidney surgery      removed cancerous lesions   Family History  Problem Relation Age of Onset  . CAD Mother   . Alcoholism Father   . COPD Father   . COPD Father    Social History  Substance Use Topics  . Smoking status: Current Every Day Smoker -- 1.00 packs/day for 30 years    Types: Cigarettes  . Smokeless tobacco: Never Used     Comment: smoking up to 1.5 ppd per husband.   . Alcohol Use: 0.0 oz/week    0 Standard drinks or equivalent per week   OB History    No data available     Review of Systems  Constitutional: Negative for fatigue.  Gastrointestinal: Positive for nausea, vomiting and abdominal pain. Negative for anorexia.  Genitourinary: Negative for dysuria.  All other systems reviewed and are negative.     Allergies  Aspirin and Chlorpromazine hcl  Home Medications   Prior to Admission medications   Medication Sig Start Date End Date Taking?  Authorizing Provider  albuterol (PROVENTIL HFA;VENTOLIN HFA) 108 (90 Base) MCG/ACT inhaler Inhale 2 puffs into the lungs every 6 (six) hours as needed for wheezing or shortness of breath. 02/27/15  Yes Juanito Doom, MD  HYDROmorphone (DILAUDID) 4 MG tablet Take 1 tablet (4 mg total) by mouth every 6 (six) hours as needed for severe pain. 09/23/13  Yes Sherwood Gambler, MD  lisinopril (PRINIVIL,ZESTRIL) 10 MG tablet Take 1 tablet (10 mg total) by mouth daily. 06/06/13  Yes Costin Karlyne Greenspan, MD  LORazepam (ATIVAN) 1 MG tablet Take 1 tablet (1 mg total) by mouth every 6 (six) hours as needed. For anxiety. Patient taking differently: Take 1 mg by mouth every 6 (six) hours as needed for anxiety. For anxiety. 04/01/13  Yes Estela Leonie Green, MD  mometasone-formoterol [facility]) 200-5 MCG/ACT AERO INHALE 2 PUFFS INTO THE LUNGS TWICE DAILY 02/27/15  Yes Juanito Doom, MD  ondansetron (ZOFRAN) 4 MG tablet Take 4 mg by mouth 3 (three) times daily as needed for nausea or vomiting.  05/09/14  Yes Historical Provider, MD  tiotropium (SPIRIVA HANDIHALER) 18 MCG inhalation capsule Place 1 capsule (18 mcg total) into inhaler and inhale daily. 02/27/15  Yes Juanito Doom, MD   BP 114/77 mmHg  Pulse 93  Temp(Src) 98.3 F (36.8 C) (Oral)  Resp 16  SpO2 93% Physical Exam  Constitutional: She is oriented to person, place, and time. She appears well-developed and well-nourished.  HENT:  Head: Normocephalic and atraumatic.  Neck: Normal range of motion.  Cardiovascular: Normal rate and regular rhythm.   Pulmonary/Chest: Effort normal. No stridor. No respiratory distress.  Abdominal: Soft. Bowel sounds are normal. She exhibits no distension. There is tenderness.  Musculoskeletal: Normal range of motion. She exhibits no edema or tenderness.  Neurological: She is alert and oriented to person, place, and time. No cranial nerve deficit.  Skin: Skin is warm and dry.  Nursing note and vitals reviewed.   ED  Course  Procedures (including critical care time)  Emergency Ultrasound Study:   Angiocath insertion Performed by: Merrily Pew  Consent: Verbal consent obtained. Risks and benefits: risks, benefits and alternatives were discussed Immediately prior to procedure the correct patient, procedure, equipment, support staff and site/side marked as needed.  Indication: difficult IV access Preparation: Patient was prepped and draped in the usual sterile fashion. Vein Location: right cephalic vein was visualized during assessment for potential access sites and was found to be patent/ easily compressed with linear ultrasound.  The needle was visualized with real-time ultrasound and guided into the vein. Gauge: 20  Image saved and stored.  Normal blood return.  Patient tolerance: Patient tolerated the procedure well with no immediate complications.    Labs Review Labs Reviewed  COMPREHENSIVE METABOLIC PANEL - Abnormal; Notable for the following:    Sodium 134 (*)    All other components within normal limits  LIPASE, BLOOD  CBC  URINALYSIS, ROUTINE W REFLEX MICROSCOPIC (NOT AT [facility])    Imaging Review No results found. I have personally reviewed and evaluated these images and lab results as part of my medical decision-making.   EKG Interpretation   Date/Time:  Tuesday June 24 2015 20:30:52 EDT Ventricular Rate:  89 PR Interval:    QRS Duration: 65 QT Interval:  347 QTC Calculation: 423 R Axis:   86 Text Interpretation:  Sinus rhythm Borderline short PR interval Biatrial  enlargement Borderline right axis deviation Since last tracing rate slower  Confirmed by Winfred Leeds  MD, SAM [phone]) on 06/24/2015 9:04:15 PM      MDM   Final diagnoses:  Abdominal pain, unspecified abdominal location    Please 30-year-old female with abdominal pain. History of pancreatitis. Patient has chronic abdominal pain that is more of a flareup from that. I discussed with the patient given 1 dose of  pain medicine already for labs and imaging and she was okay with that. However later in her stay she is demanding more pain medication. Reminded her that I was not treating her pain last pathology was seen in as of now her labs were normal without any evidence of pancreatitis or gallbladder pathology. Patient became angry and wanted to leave prior to her CT scan patient was considered to be comminuted make her own decisions and was offered alternative treatments and refused. Patient was discharged [place].    Merrily Pew, MD 06/25/15 [phone]

## 2015-06-24 NOTE — ED Notes (Signed)
Attempted IV without success, ordered IV consult.  MD at bedside to attempt [place].

## 2015-06-24 NOTE — ED Notes (Signed)
Pt reports starting yesterday she began having abdominal pain radiating up to the lower half of the sternum.  Pt denies diarrhea but states she has vomited 2 times in the last 24 hours.  Unable to tolerate food or medicine.  Able to tolerate some liquids.

## 2015-06-24 NOTE — ED Notes (Signed)
Patient request labs be drawn once she's in a patient room.

## 2015-06-24 NOTE — ED Notes (Signed)
Pt states she "messed up" and had 2 cans of beer yesterday.

## 2015-06-25 NOTE — ED Notes (Signed)
Patient noted to be standing in doorway pulling on IV bag, advising that she is needing pain medication.

## 2015-06-25 NOTE — ED Notes (Addendum)
Pt was told she had no medications ordered. Pt got upset and stated she wanted to leave. EDP notified. IV was removed by Philippa Sicks. Pt did not allow dc vitals nor did she sign stating she was leaving AMA

## 2015-06-25 NOTE — ED Notes (Signed)
Pt at the secretary's desk, carrying IV bag with her,  demanding to see her nurse and asking for pain/nausea medication.  RN aware.

## 2015-08-25 IMAGING — CT CT ANGIO CHEST
2 of 4 series · 18 of 32 positions shown · IV contrast (OMNIPAQUE 300)
Comparison: Abdominal CT 10/28/2011.

CLINICAL DATA: Abdominal pain and pancreatitis. Shortness of breath
and cough.

EXAM:
CT ANGIOGRAPHY CHEST
CT ABDOMEN AND PELVIS WITH CONTRAST
TECHNIQUE: Multidetector CT imaging of the chest was performed using the
standard protocol during bolus administration of intravenous
contrast. Multiplanar CT image reconstructions including MIPs were
obtained to evaluate the vascular anatomy. Multidetector CT imaging
of the abdomen and pelvis was performed using the standard protocol
during bolus administration of intravenous contrast.
CONTRAST:  70mL OMNIPAQUE IOHEXOL 350 MG/ML SOLN

[Series 7: abd/pel with · axial · 0.60mm/px · z∈[+1163,+1243]mm · 2 of 80 slices shown]
[im 16/80  lung]
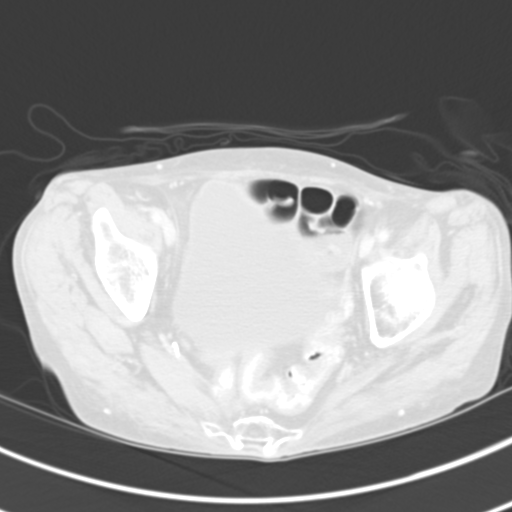
[im 32/80  lung]
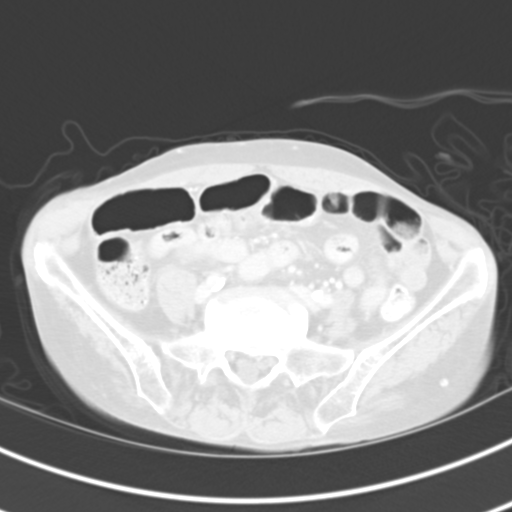

[Series 8: thins for pacs · axial · 0.60mm/px · z∈[+1434,+1653]mm · 16 of 249 slices shown]
[im 15/249  lung]
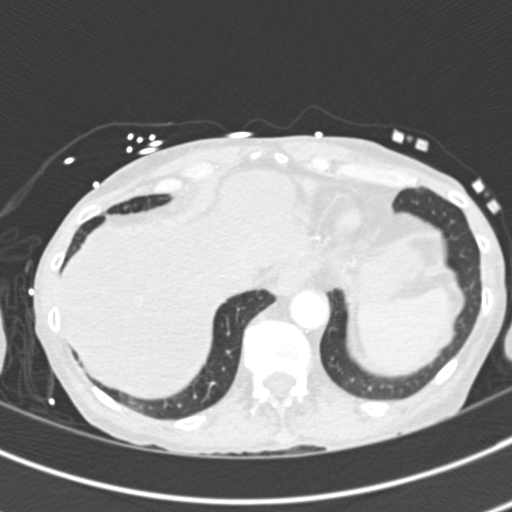
[im 30/249  soft-tissue]
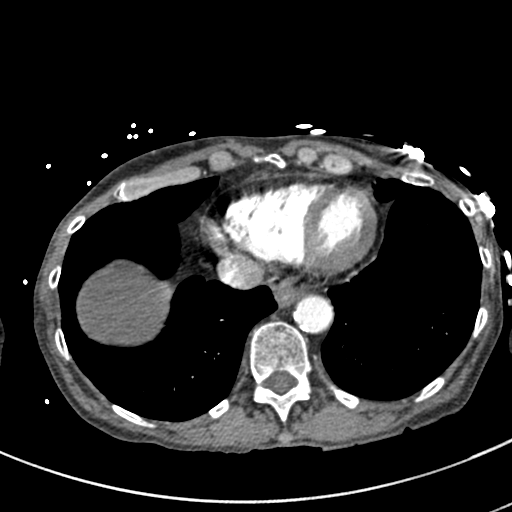
[im 44/249  lung]
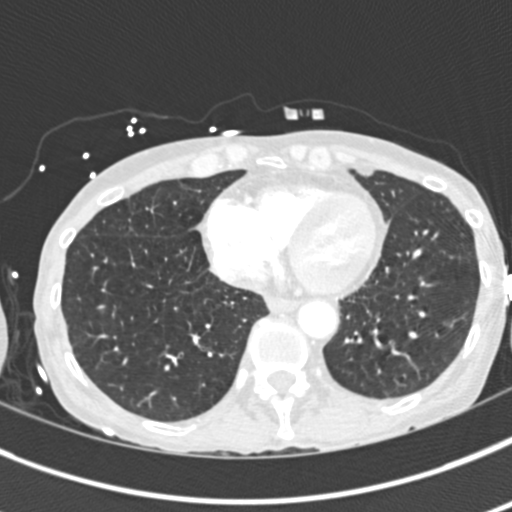
[im 59/249  soft-tissue]
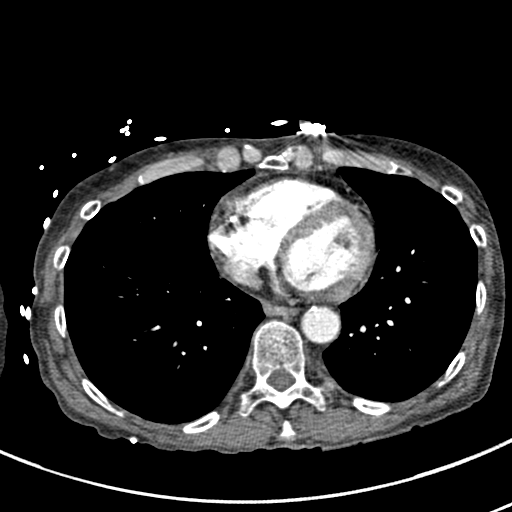
[im 73/249  lung]
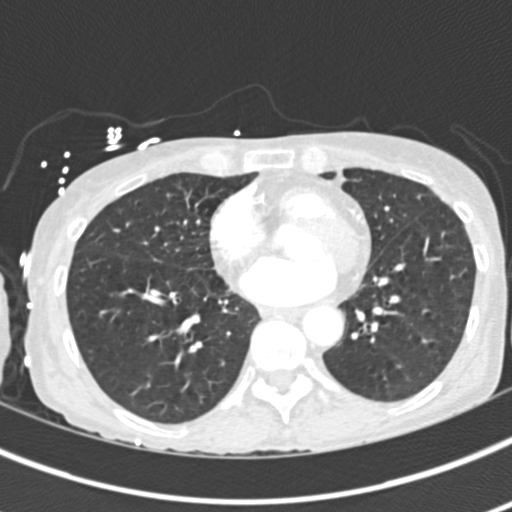
[im 88/249  soft-tissue]
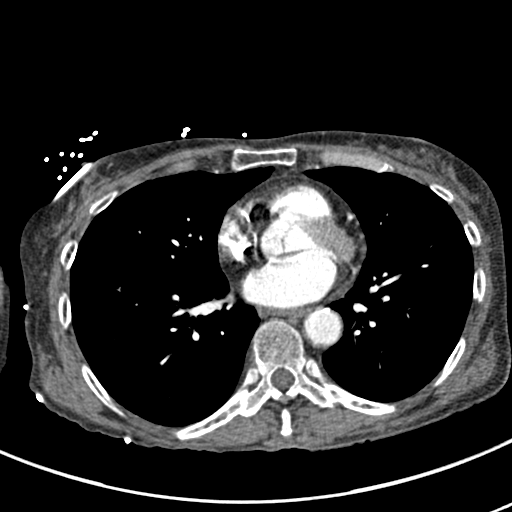
[im 103/249  lung]
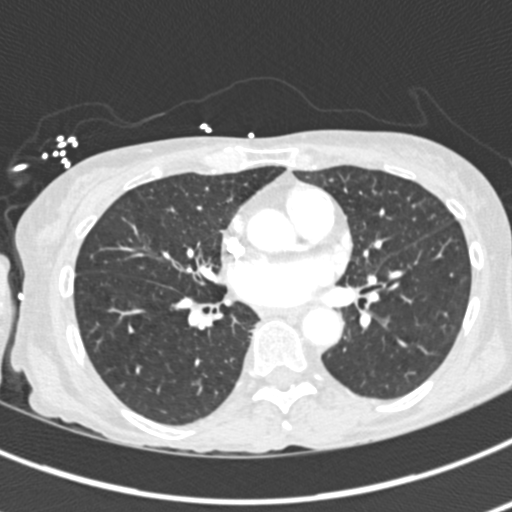
[im 117/249  soft-tissue]
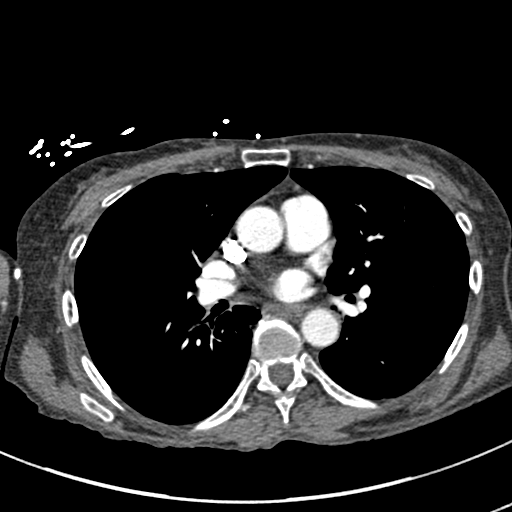
[im 132/249  lung]
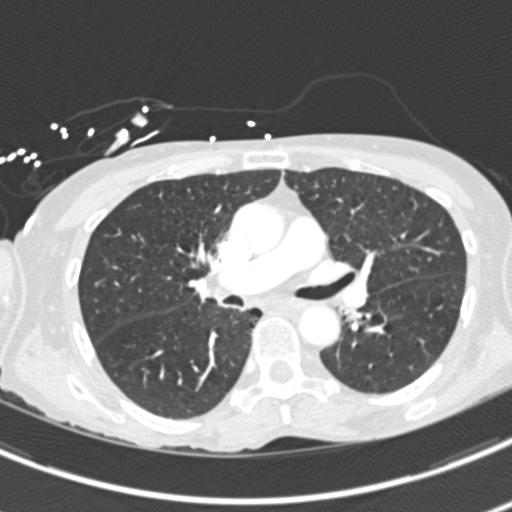
[im 146/249  soft-tissue]
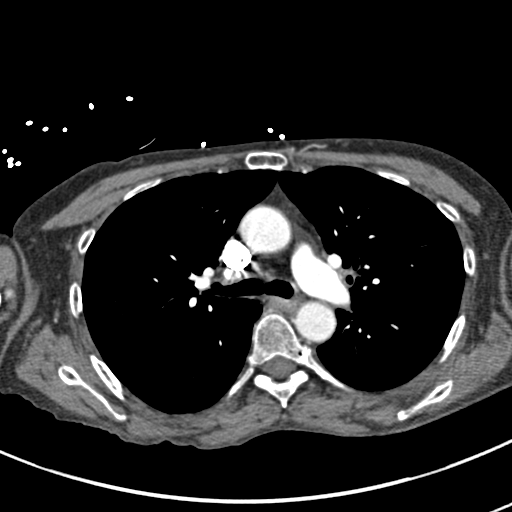
[im 161/249  lung]
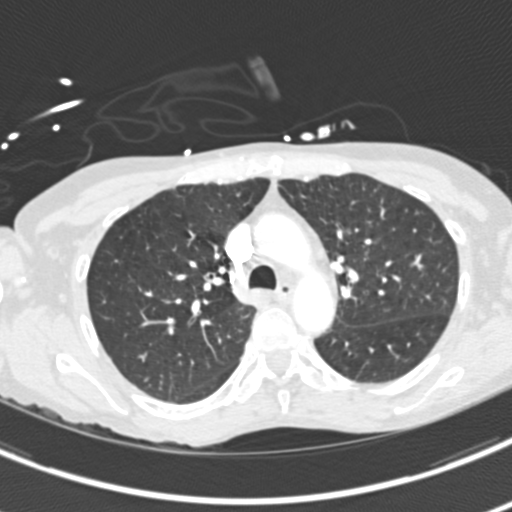
[im 176/249  soft-tissue]
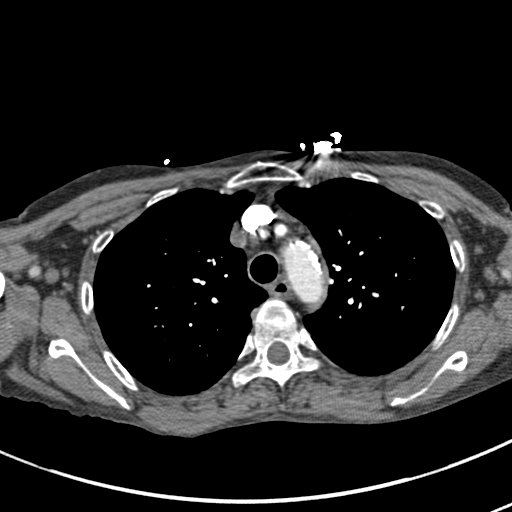
[im 190/249  lung]
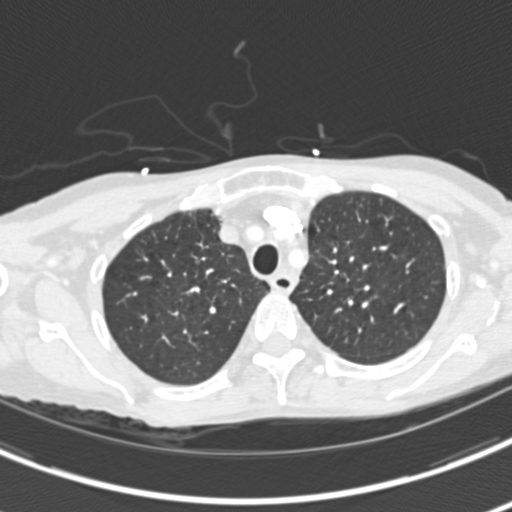
[im 205/249  soft-tissue]
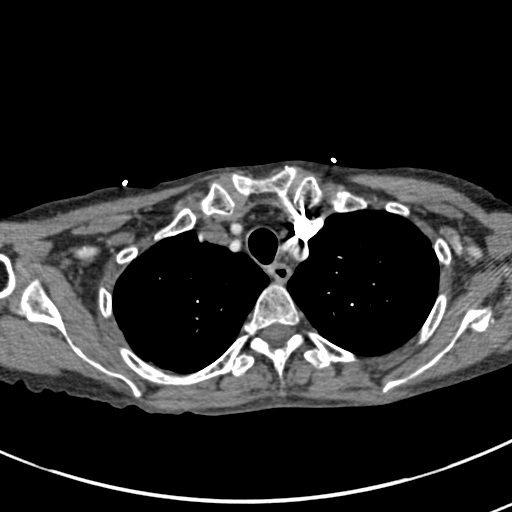
[im 219/249  lung]
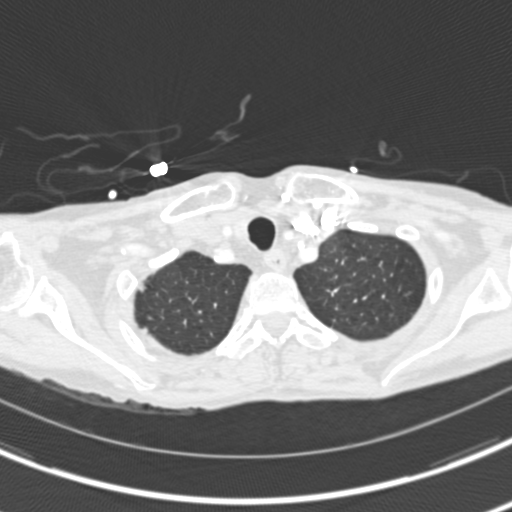
[im 234/249  soft-tissue]
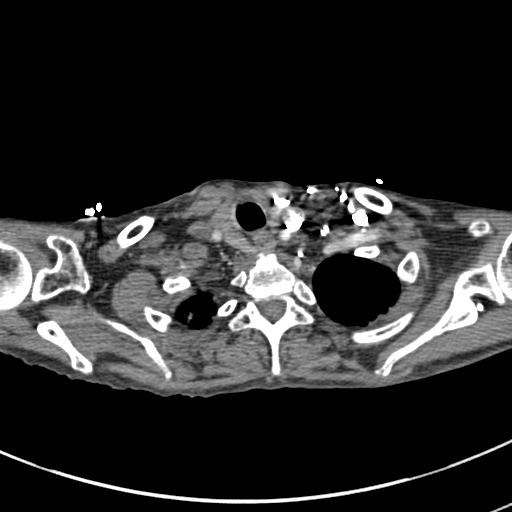

[18 of 32 positions shown; findings below may reference images not displayed]

FINDINGS: CTA CHEST FINDINGS

THORACIC INLET/BODY WALL:

No acute abnormality.

MEDIASTINUM:

Normal heart size. No pericardial effusion. Diffuse atherosclerosis,
including the coronary arteries. No acute vascular abnormality,
including pulmonary embolism or aortic dissection. No adenopathy.

LUNG WINDOWS:

No consolidation. Suspect mild centrilobular emphysema at the
apices. No consolidation. Tiny subpleural nodular densities,
especially the right upper lung appear most like scarring.

OSSEOUS:

No acute fracture.  No suspicious lytic or blastic lesions.

CT ABDOMEN and PELVIS FINDINGS

BODY WALL: Unremarkable.

ABDOMEN/PELVIS:

Liver: Extensive fatty infiltration.

Biliary: Distended gallbladder, without visible stones or wall
thickening. Chronic biliary ductal dilatation to 1 cm. No duct stone
identified.

Pancreas: There is new diffuse parenchymal enlargement and
peripancreatic fat infiltration. No fluid collection, necrosis,
ductal dilatation, or vascular complication.

Spleen: Unremarkable.

Adrenals: Unremarkable.

Kidneys and ureters: Complex, partially cystic mass posterior to the
left renal hilum is stable in size at 25 mm. This is reportedly a
remote cryoablation site. No new enhancement to suggest tumor
recurrence.

Bladder: Unremarkable.

Reproductive: Hysterectomy.

Bowel: No obstruction. No pericecal inflammation.

Retroperitoneum: No mass or adenopathy.

Peritoneum: No free fluid or gas.

Vascular: No acute abnormality.

OSSEOUS: ORIF of the proximal left femur. Imaged portions of the
hardware are unremarkable.

Review of the MIP images confirms the above findings.
IMPRESSION: 1. Pancreatitis. No necrosis, collection, or other complicating
feature.
2. Negative for pulmonary embolism or other acute intrathoracic
abnormality.

## 2015-08-25 IMAGING — CR DG CHEST 1V PORT
1 series · 1 of 1 positions shown · non-contrast
Comparison: 06/07/2011

CLINICAL DATA: Shortness of breath. Upper right. Rule out free air.

EXAM:
PORTABLE CHEST - 1 VIEW

[AP]
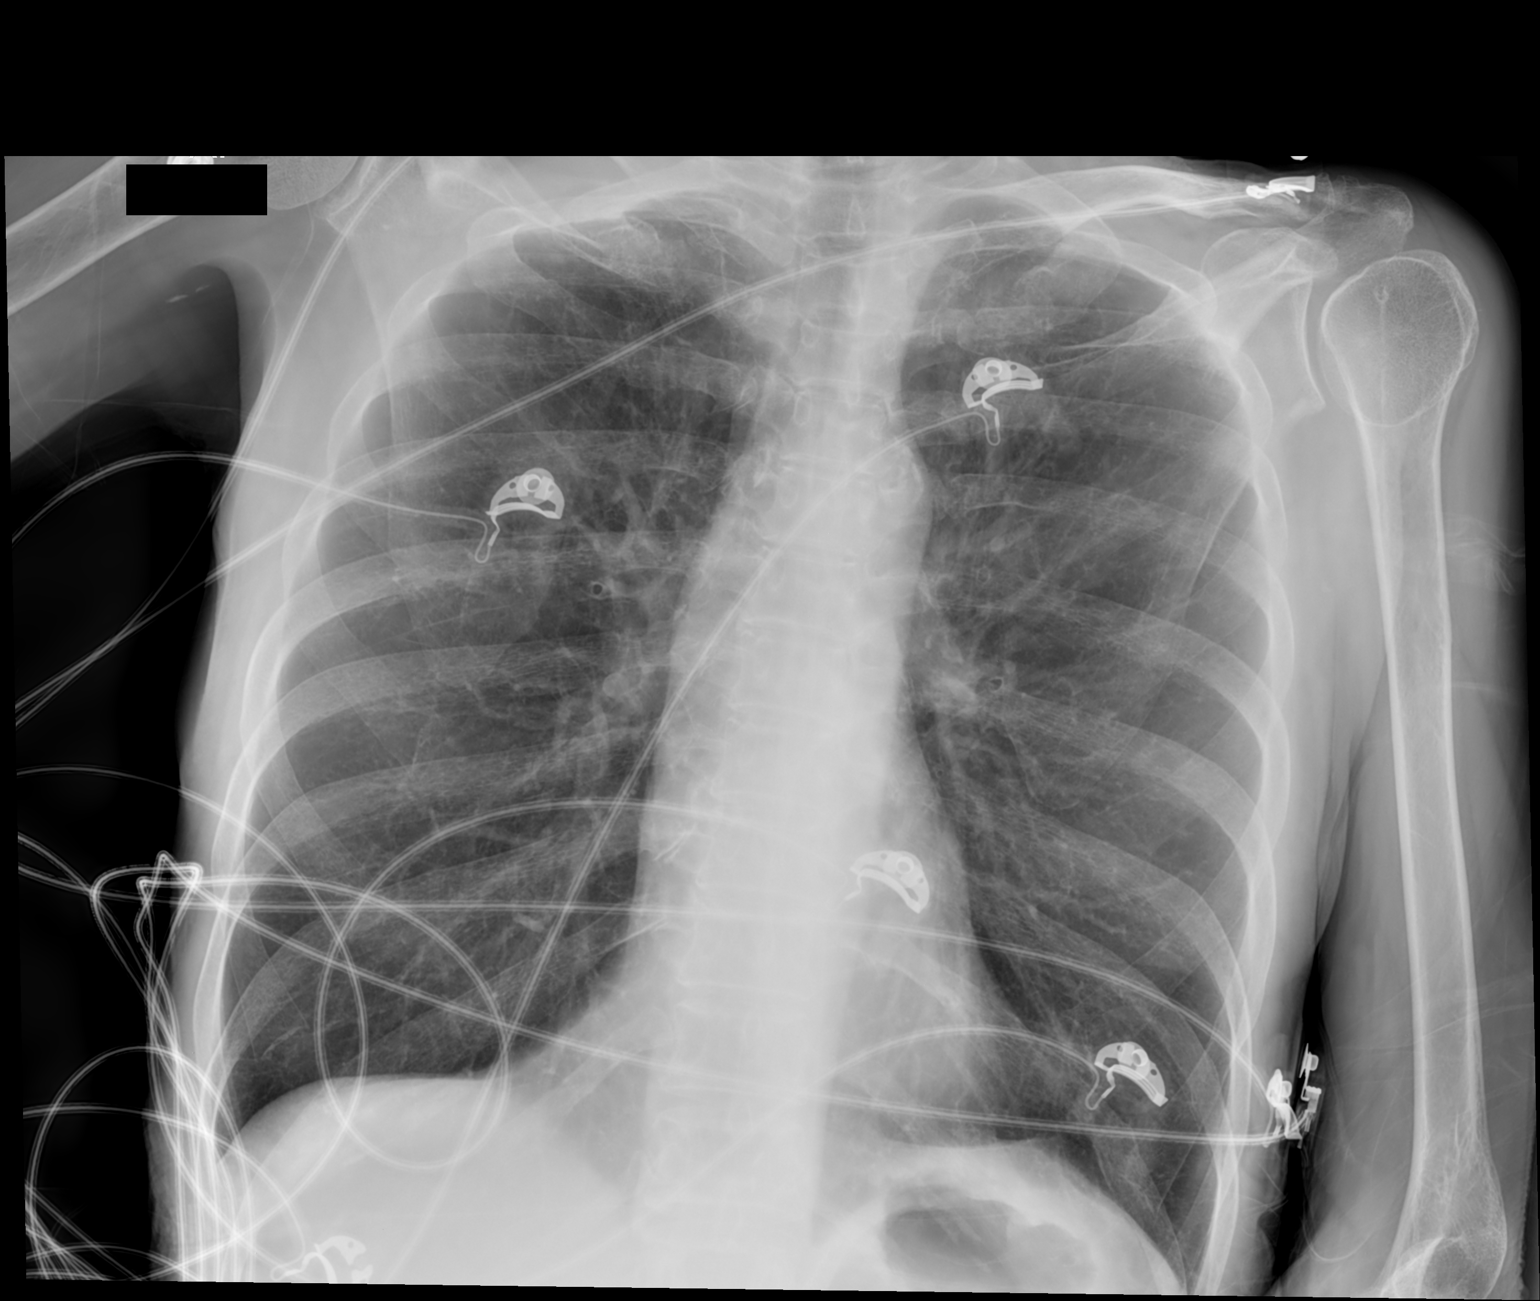

[1 of 1 positions shown; findings below may reference images not displayed]

FINDINGS: Hyperinflated lungs. No edema or consolidation. No effusion or
pneumothorax. Normal heart size. No evidence of pneumoperitoneum in
the upper abdomen.
IMPRESSION: 1. Stable exam.   No active disease.
2. No evidence of free peritoneal air.

## 2015-09-03 ENCOUNTER — Ambulatory Visit (INDEPENDENT_AMBULATORY_CARE_PROVIDER_SITE_OTHER): Payer: Medicaid Other | Admitting: Pulmonary Disease

## 2015-09-03 ENCOUNTER — Ambulatory Visit (INDEPENDENT_AMBULATORY_CARE_PROVIDER_SITE_OTHER)
Admission: RE | Admit: 2015-09-03 | Discharge: 2015-09-03 | Disposition: A | Discharge status: Home / Self Care | Payer: Medicaid Other | Source: Ambulatory Visit | Attending: Pulmonary Disease | Admitting: Pulmonary Disease

## 2015-09-03 ENCOUNTER — Encounter: Payer: Self-pay | Admitting: Pulmonary Disease

## 2015-09-03 ENCOUNTER — Telehealth: Payer: Self-pay | Admitting: Pulmonary Disease

## 2015-09-03 VITALS — BP 126/72 | HR 111 | Ht 64.0 in | Wt 74.6 lb

## 2015-09-03 DIAGNOSIS — J449 Chronic obstructive pulmonary disease, unspecified: Secondary | ICD-10-CM | POA: Diagnosis not present

## 2015-09-03 DIAGNOSIS — F17210 Nicotine dependence, cigarettes, uncomplicated: Secondary | ICD-10-CM

## 2015-09-03 DIAGNOSIS — R05 Cough: Secondary | ICD-10-CM

## 2015-09-03 DIAGNOSIS — R634 Abnormal weight loss: Secondary | ICD-10-CM

## 2015-09-03 DIAGNOSIS — R059 Cough, unspecified: Secondary | ICD-10-CM | POA: Insufficient documentation

## 2015-09-03 MED ORDER — NICOTINE 10 MG IN INHA: 1.0000 | RESPIRATORY_TRACT | 0 refills | Status: DC | PRN

## 2015-09-03 MED ORDER — BENZONATATE 200 MG PO CAPS: 200.0000 mg | ORAL_CAPSULE | Freq: Three times a day (TID) | ORAL | 1 refills | Status: DC | PRN

## 2015-09-03 MED ORDER — LOSARTAN POTASSIUM 100 MG PO TABS: 100.0000 mg | ORAL_TABLET | Freq: Every day | ORAL | 11 refills | Status: DC

## 2015-09-03 NOTE — Progress Notes (Addendum)
Subjective:    Patient ID: [name], female    DOB: [birth year], 65 y.o.   MRN: [id number]  Synopsis: Gold grade 4 COPD PFT 07/17/2014 FEV1 0.95 ((36%)  Ratio 49 p 25% resp to saba  DLCO 49% Still smoking as of 09/02/2014    HPI Chief Complaint  Patient presents with  . Follow-up    pt c/o stable sob with any exertion, prod cough with yellow mucus worse qam, sinus congestion, PND.     [name] notes significant cough with mucus production. She says the cough is a little worse but othersoe everyhing else is about the same. She had to take an antibiotic for a tooth infection a few weeks back. No predinsone for bronchitis. Still smoking cigarettes, but she says she has cut back but her boyfriend says she has been smoking 2 pounds per day. She feels she has lost a few pounds because her appetite is down. She notes that she still takes the Dulera bid and Spiriva.  She takes albuterol 2-3 times a day due to dyspnea.  She tries to walk for exercise but can only walk about 50 - 100 without stopping.     Past Medical History:  Diagnosis Date  . Cancer ([place])    renal ca  . COPD (chronic obstructive pulmonary disease) ([place])   . Pancreatitis   . Pancreatitis   . Seizures ([place])   . Substance abuse       Review of Systems  Constitutional: Positive for fatigue. Negative for chills and fever.  HENT: Negative for nosebleeds, postnasal drip and rhinorrhea.   Respiratory: Positive for cough, shortness of breath and wheezing.   Cardiovascular: Negative for chest pain, palpitations and leg swelling.       Objective:   Physical Exam Vitals:   09/03/15 1013  BP: 126/72  Pulse: (!) 111  SpO2: 95%  Weight: 74 lb 9.6 oz (33.8 kg)  Height: '5\' 4"'$  (1.626 m)   RA  Gen: frail, then, chronically ill appearing HENT: OP clear, , neck supple PULM: Wheezing only with frequent cough, limited air movement CV: RRR, no mgr, trace edema GI: BS+, soft, nontender Derm: no cyanosis or  rash Psyche: normal mood and affect  Last CXR 12/2014 reviewed> showed emphysema  CBC    Component Value Date/Time   WBC 6.6 06/24/2015 1947   RBC 4.18 06/24/2015 1947   HGB 12.5 06/24/2015 1947   HCT 36.6 06/24/2015 1947   PLT 212 06/24/2015 1947   MCV 87.6 06/24/2015 1947   MCH 29.9 06/24/2015 1947   MCHC 34.2 06/24/2015 1947   RDW 14.9 06/24/2015 1947   LYMPHSABS 1.4 02/13/2015 1212   MONOABS 0.7 02/13/2015 1212   EOSABS 0.1 02/13/2015 1212   BASOSABS 0.0 02/13/2015 1212          Assessment & Plan:  Tobacco abuse Counseled at length to quit smoking today for 5 minutes, ordered nicotrol inhaler, gave community resources for smoking cessation  COPD GOLD III with reversible component  Gold grade D COPD, with significant weight loss complicating the picture. I explained to her today at length that her one-year mortality is exceedingly elevated considering her weight loss and ongoing tobacco use. I counseled her at length that she needs to quit smoking.  Plan: Continue Dulera, continue Spiriva Flu shot up-to-date Quit smoking Work on weight gain with adequate nutrition  Cough Likely due to COPD and ongoing tobacco use but with the increase in mucus production weight loss I have  concern for atypical mycobacterial disease. Also the lisinopril is contributing.  Plan: Stop lisinopril Start losartan Assess for atypical mycobacterial disease with AFP mucus 3  Loss of weight Workup as above, sample mucus for culture for AFB 3, chest x-ray    Current Outpatient Prescriptions:  .  albuterol (PROVENTIL HFA;VENTOLIN HFA) 108 (90 Base) MCG/ACT inhaler, Inhale 2 puffs into the lungs every 6 (six) hours as needed for wheezing or shortness of breath., Disp: 1 Inhaler, Rfl: 5 .  HYDROmorphone (DILAUDID) 4 MG tablet, Take 1 tablet (4 mg total) by mouth every 6 (six) hours as needed for severe pain., Disp: 10 tablet, Rfl: 0 .  LORazepam (ATIVAN) 1 MG tablet, Take 1 tablet (1 mg  total) by mouth every 6 (six) hours as needed. For anxiety. (Patient taking differently: Take 1 mg by mouth every 6 (six) hours as needed for anxiety. For anxiety.), Disp: 10 tablet, Rfl: 0 .  mometasone-formoterol (DULERA) 200-5 MCG/ACT AERO, INHALE 2 PUFFS INTO THE LUNGS TWICE DAILY, Disp: 13 g, Rfl: 5 .  ondansetron (ZOFRAN) 4 MG tablet, Take 4 mg by mouth 3 (three) times daily as needed for nausea or vomiting. , Disp: , Rfl: 0 .  tiotropium (SPIRIVA HANDIHALER) 18 MCG inhalation capsule, Place 1 capsule (18 mcg total) into inhaler and inhale daily., Disp: 30 capsule, Rfl: 5 .  benzonatate (TESSALON) 200 MG capsule, Take 1 capsule (200 mg total) by mouth 3 (three) times daily as needed for cough., Disp: 30 capsule, Rfl: 1 .  losartan (COZAAR) 100 MG tablet, Take 1 tablet (100 mg total) by mouth daily., Disp: 30 tablet, Rfl: 11 .  nicotine (NICOTROL) 10 MG inhaler, Inhale 1 cartridge (1 continuous puffing total) into the lungs as needed for smoking cessation., Disp: 42 each, Rfl: 0  Addendum: Will ask a partner or NP from our office to prescribe due to a previously unknown issue with our medicare credentialing office. Roselie Awkward, MD [place] [facility] Pager: [phone] Cell: [phone] After 3pm or if no response, call [phone]

## 2015-09-03 NOTE — Telephone Encounter (Signed)
LMTCB x 1 

## 2015-09-03 NOTE — Assessment & Plan Note (Signed)
Counseled at length to quit smoking today for 5 minutes, ordered nicotrol inhaler, gave community resources for smoking cessation

## 2015-09-03 NOTE — Addendum Note (Signed)
Addended by: Clayborne Dana C on: 09/03/2015 03:40 PM   Modules accepted: Orders

## 2015-09-03 NOTE — Assessment & Plan Note (Signed)
Workup as above, sample mucus for culture for AFB 3, chest x-ray

## 2015-09-03 NOTE — Assessment & Plan Note (Signed)
Likely due to COPD and ongoing tobacco use but with the increase in mucus production weight loss I have concern for atypical mycobacterial disease. Also the lisinopril is contributing.  Plan: Stop lisinopril Start losartan Assess for atypical mycobacterial disease with AFP mucus 3

## 2015-09-03 NOTE — Assessment & Plan Note (Signed)
Gold grade D COPD, with significant weight loss complicating the picture. I explained to her today at length that her one-year mortality is exceedingly elevated considering her weight loss and ongoing tobacco use. I counseled her at length that she needs to quit smoking.  Plan: Continue Dulera, continue Spiriva Flu shot up-to-date Quit smoking Work on weight gain with adequate nutrition

## 2015-09-03 NOTE — Patient Instructions (Signed)
Stop smoking, stop smoking, stop smoking  Use the Nicotrol inhaler as needed for assistance with smoking cravings  Keep using your inhaled medicines as you're doing, be sure to rinse her mouth afterwards  Bring [place] 3 samples of mucus  We will see you back in 3 months or sooner if needed  We will call you with the results of today's chest x-ray

## 2015-09-04 NOTE — Telephone Encounter (Signed)
Called [facility] at [phone] and initiated the PA for this pt.  They stated that we can call back after 24 hours to check the status of this PA at the number above and give the confirmation # [id number].  Will forward to [place] to follow up on.

## 2015-09-04 NOTE — Telephone Encounter (Signed)
Called and spoke with pts husband and he stated that the insurance will cover this if a PA is done.  Received the paper for the PA--will do this today

## 2015-09-09 ENCOUNTER — Encounter: Payer: Self-pay | Admitting: *Deleted

## 2015-09-09 MED ORDER — NICOTINE POLACRILEX 4 MG MT LOZG: LOZENGE | OROMUCOSAL | 5 refills | Status: DC

## 2015-09-09 NOTE — Telephone Encounter (Signed)
OK, would order '4mg'$  tablets per this schedule as detailed in this note.

## 2015-09-09 NOTE — Telephone Encounter (Signed)
Nicorette losenge

## 2015-09-09 NOTE — Telephone Encounter (Signed)
Called [facility] and spoke with pharmacist Shanon Brow and Rx given verbally w/ 5 refills Med list updated

## 2015-09-09 NOTE — Telephone Encounter (Signed)
Shanon Brow from [facility] called states that BQ is not authorized under Medicaid and asking if another dr would be the prescriber -pr

## 2015-09-09 NOTE — Telephone Encounter (Signed)
Called spoke with spouse Ronalee Belts) and advised of BQ's recommendations to change to the Nicorette Lozenge as stated below.  Ronalee Belts was okay with this change and denied any questions/concerns.  Called [facility] and spoke with pharmacist Shanon Brow who reports the Nicorette Lozenges come in '2mg'$  and '4mg'$ .  The directions for both are the same.  Per Shanon Brow, the determination on the packages for '2mg'$  vs. '4mg'$  states that if pt smokes within 43mns of waking to start at '4mg'$  lozenges.  Weeks 1-6, 1 lozenge every 1-2 hours Weeks 7-9, 1 lozenge every 2-4 hours Weeks 10-12, 1 lozenge every 4-8 hours  Called spouse again who verified that pt indeed smokes within 376ms of waking Dr McLake Bellslease advise, thank you!

## 2015-09-09 NOTE — Telephone Encounter (Signed)
Spoke with [name] at [facility], states that rx is denied- pt must try and fail two approved medications before Nicotrol inhaler will be covered:  Nicorette  gum, nicorette  losenge, chantix, wellbutrin, nicorrelief gum.    Reference # [id number]  BQ please advise on alternative medication.  Thanks!

## 2015-09-09 NOTE — Telephone Encounter (Signed)
Spoke with TP:  Okay to send Rx as ordered by BQ below [facility] and spoke with pharmacist Shanon Brow - Rx changed and went through and covered by Medicaid Left detailed message on named VM informing pt's spouse Rx was taken care of Nothing further needed; will sign off

## 2015-09-10 ENCOUNTER — Telehealth: Payer: Self-pay | Admitting: Pulmonary Disease

## 2015-09-10 NOTE — Telephone Encounter (Signed)
Pharmacy calling, the lozenges for Smoking Cessation are making patient sick to her stomach, she would like to try the patch instead.   BQ - please advise.

## 2015-09-11 MED ORDER — NICOTINE 14 MG/24HR TD PT24: 14.0000 mg | MEDICATED_PATCH | Freq: Every day | TRANSDERMAL | 5 refills | Status: DC

## 2015-09-11 NOTE — Telephone Encounter (Signed)
Pt's husband came into the office about this matter. Pt is needing to have a prescription for nicotine patches. Nicotine lozenges are causing her to have GERD.  BQ- please advise. Thanks.

## 2015-09-11 NOTE — Telephone Encounter (Signed)
OK, prescribe 51mg patch, q day, dispense #30 refill 1

## 2015-09-11 NOTE — Telephone Encounter (Signed)
rx for the nicotine patches that have been sent to the pharamcy.  Called and spoke with pts husband and he is aware.

## 2015-09-11 NOTE — Telephone Encounter (Signed)
Pt Spouse came back in please call him when this is done [phone] send to [facility]

## 2015-09-22 IMAGING — CR DG CHEST 2V
2 series · 2 of 2 positions shown · non-contrast
Comparison: CT ANGIO CHEST W/CM &/OR WO/CM dated 02/09/2013; DG
CHEST 1V PORT dated 02/09/2013

CLINICAL DATA: Chest pain and shortness of breath.

EXAM:
CHEST  2 VIEW

[w chest pa]
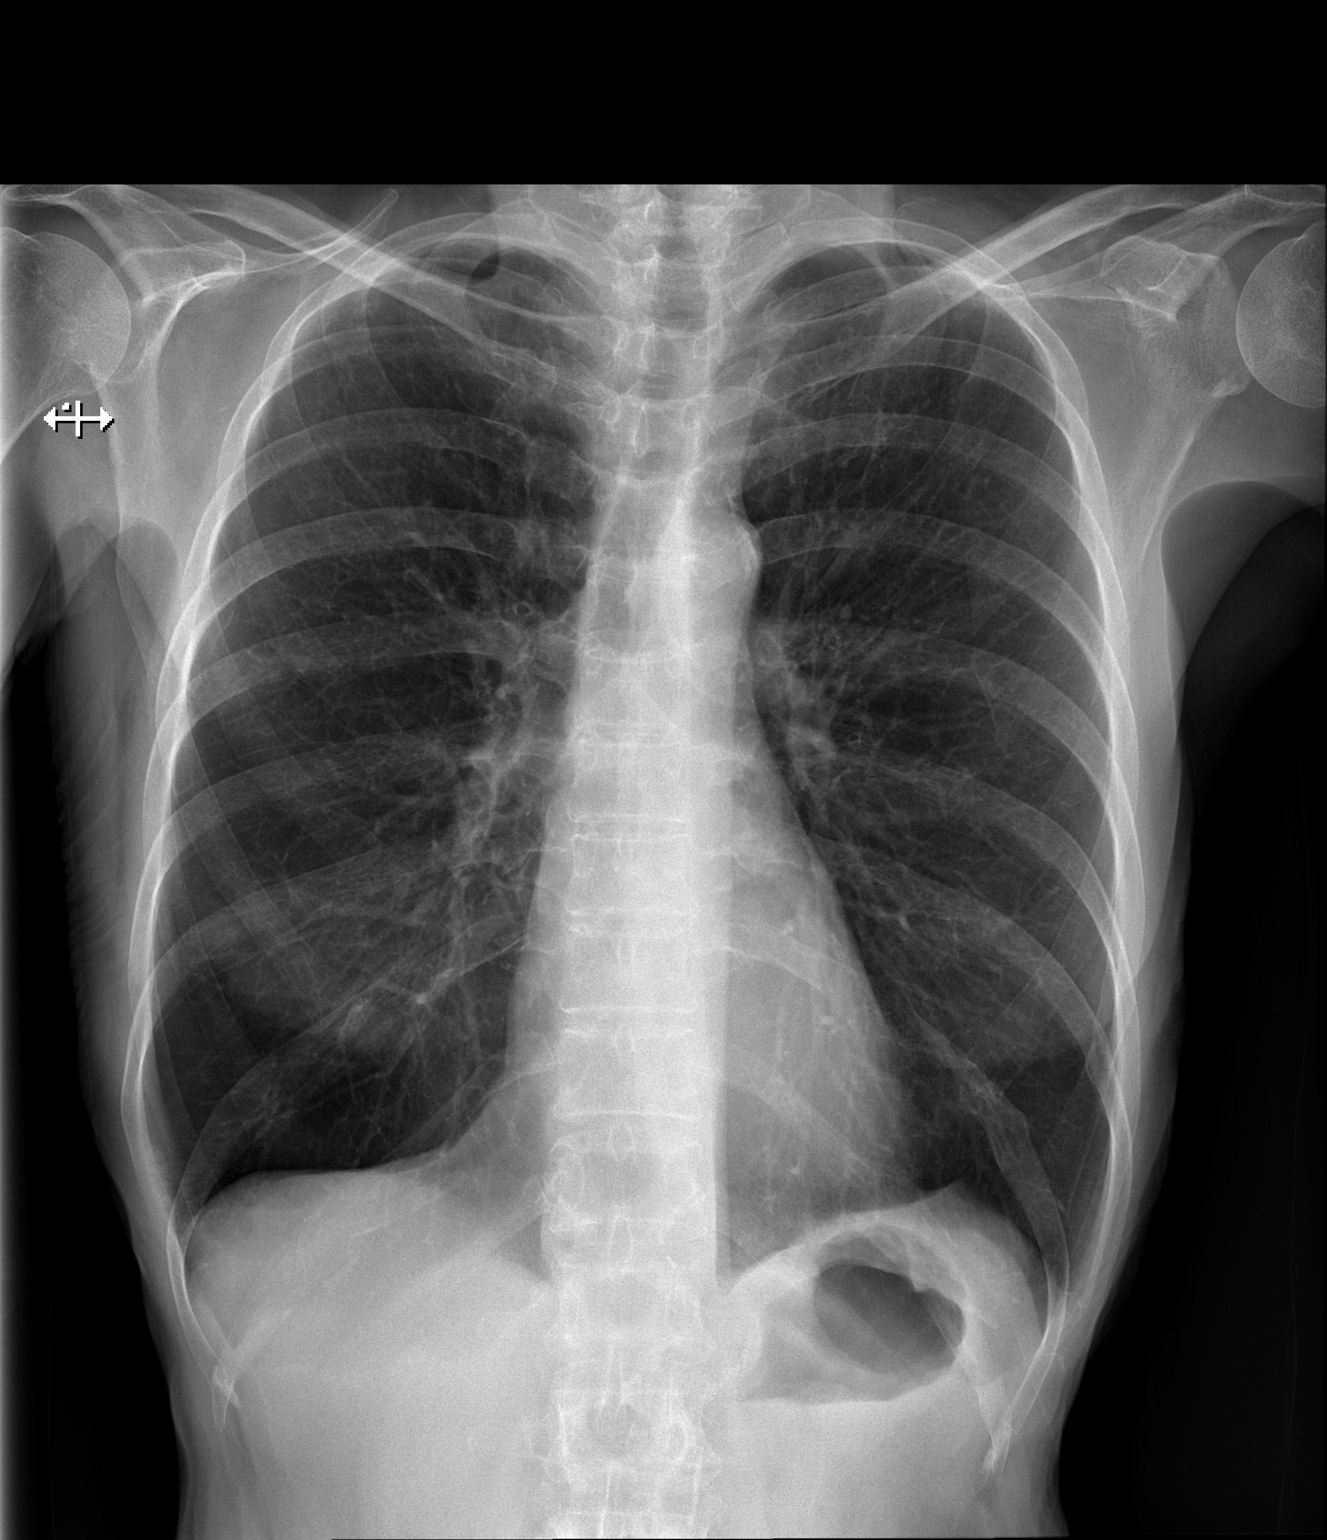

[w chest lat]
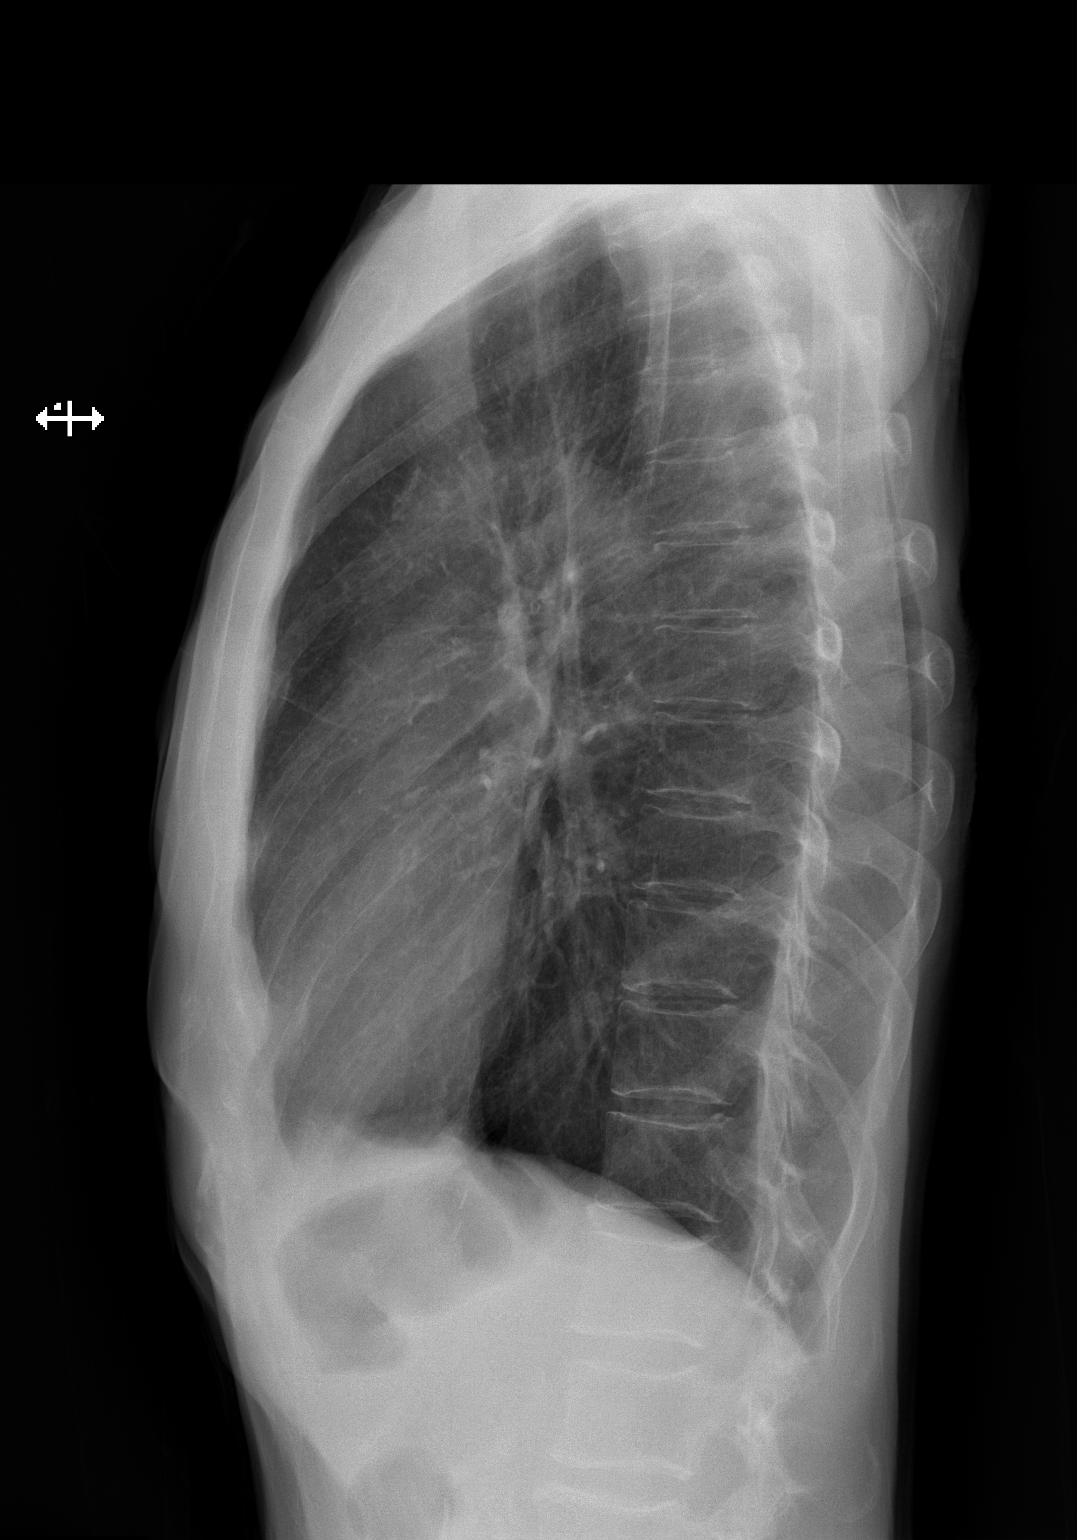

[2 of 2 positions shown; findings below may reference images not displayed]

FINDINGS: Two views of the chest demonstrate hyperinflation. Heart size is
stable. The trachea is midline. No focal airspace disease or edema.
IMPRESSION: No acute chest findings.

## 2015-09-24 ENCOUNTER — Telehealth: Payer: Self-pay | Admitting: Pulmonary Disease

## 2015-09-24 NOTE — Telephone Encounter (Signed)
[place], spoke with Freda Munro, aware that BQ is not back in the office until next week and there is no one here that will sign off on surgery clearance without seeing the patient. The most recent visit on file is from 09/03/15 and this was a sick visit - nothing mentioned about upcoming surgery or clearance. Clearance forms are being faxed to our office with "urgent" written on top of the form. The patient is being rescheduled for the surgery.  Will send to [facility] to follow up on clearance form.

## 2015-09-24 NOTE — Telephone Encounter (Signed)
PATIENT's HUSBAND IN LOBBY, Ronalee Belts, to speak to someone about surgical clearance.

## 2015-09-24 NOTE — Telephone Encounter (Signed)
Forwarding this message to [name] to document as she is the one who spoke with pt' husband.

## 2015-09-25 NOTE — Telephone Encounter (Addendum)
Was stopped by spouse in the lobby on 9.20.17 Spouse concerned anf frusttraed stating that a surgical clearance was reportedly faxed to this office on 9.12.17 and her cataract removal is scheduled for 9.21.17.  Provider had not yet received the clearance form.  After a lengthy conversation with spouse, informed him will look for the form and ask the physician on call for BQ if they are willing to sign the surgical clearance (did explain to spouse that this is NOT a guarantee, as the covering physician has never seen the patient, regardless of the fact that her PCP has already cleared her for surgery).  Spouse to wait in the lobby while we figure this out  No surgical clearance found Asked spouse for name of provider/office doing the procedure [place].  Called that office to request surgical clearance be faxed STAT  Form received.  PM is doc of the day - explained situation and BQ not being in the office.  PM graciously filled out the surgical clearance form and it was faxed back to [facility].  Spouse aware.  Form sent for scan.  Nothing further needed; will sign off

## 2015-10-07 ENCOUNTER — Encounter: Payer: Self-pay | Admitting: Interventional Radiology

## 2015-10-11 IMAGING — US IR FLUORO GUIDE CV LINE*L*
1 series · 1 of 1 positions shown · non-contrast
Comparison: none

CLINICAL DATA: Pancreatitis

[Series 1: sp fluoro guide cv line*left* · 1 of 1 slices shown]
[im 1/1]
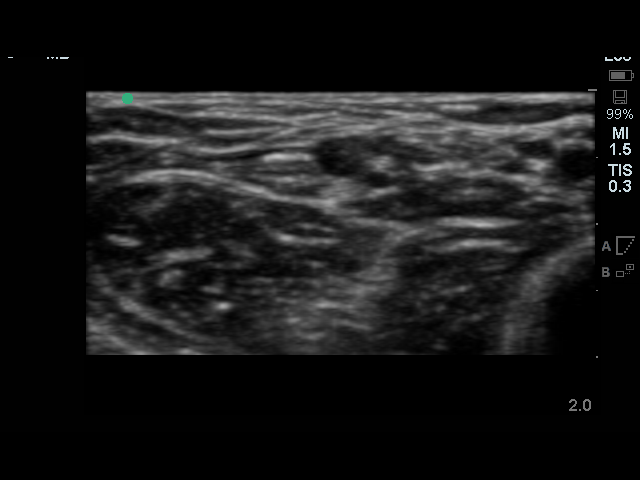

[1 of 1 positions shown; findings below may reference images not displayed]

EXAM:
Left upper extremity PICC LINE PLACEMENT WITH ULTRASOUND AND
FLUOROSCOPIC GUIDANCE

FLUOROSCOPY TIME:  12 seconds.

PROCEDURE:
The patient was advised of the possible risks andcomplications and
agreed to undergo the procedure. The patient was then brought to the
angiographic suite for the procedure.

The left arm was prepped with chlorhexidine, drapedin the usual
sterile fashion using maximum barrier technique (cap and mask,
sterile gown, sterile gloves, large sterile sheet, hand hygiene and
cutaneous antisepsis) and infiltrated locally with 1% Lidocaine.

Ultrasound demonstrated patency of the left basilic vein, and this
was documented with an image. Under real-time ultrasound guidance,
this vein was accessed with a 21 gauge micropuncture needle and
image documentation was performed. A [DATE] wire was introduced in to
the vein. Over this, a 5 French double lumen Power PICC was advanced
to the lower SVC/right atrial junction. Fluoroscopy during the
procedure and fluoro spot radiograph confirms appropriate catheter
position. The catheter was flushed and covered with asterile
dressing.

Complications: None
IMPRESSION: Successful left arm Power PICC line placement with ultrasound and
fluoroscopic guidance. The catheter is ready for use.

## 2015-10-11 IMAGING — CR DG CHEST 2V
2 series · 2 of 2 positions shown · non-contrast
Comparison: Chest x-ray 03/09/2013.

CLINICAL DATA: Shortness of breath and chest pain for the past
week.

EXAM:
CHEST  2 VIEW

[w chest lat]
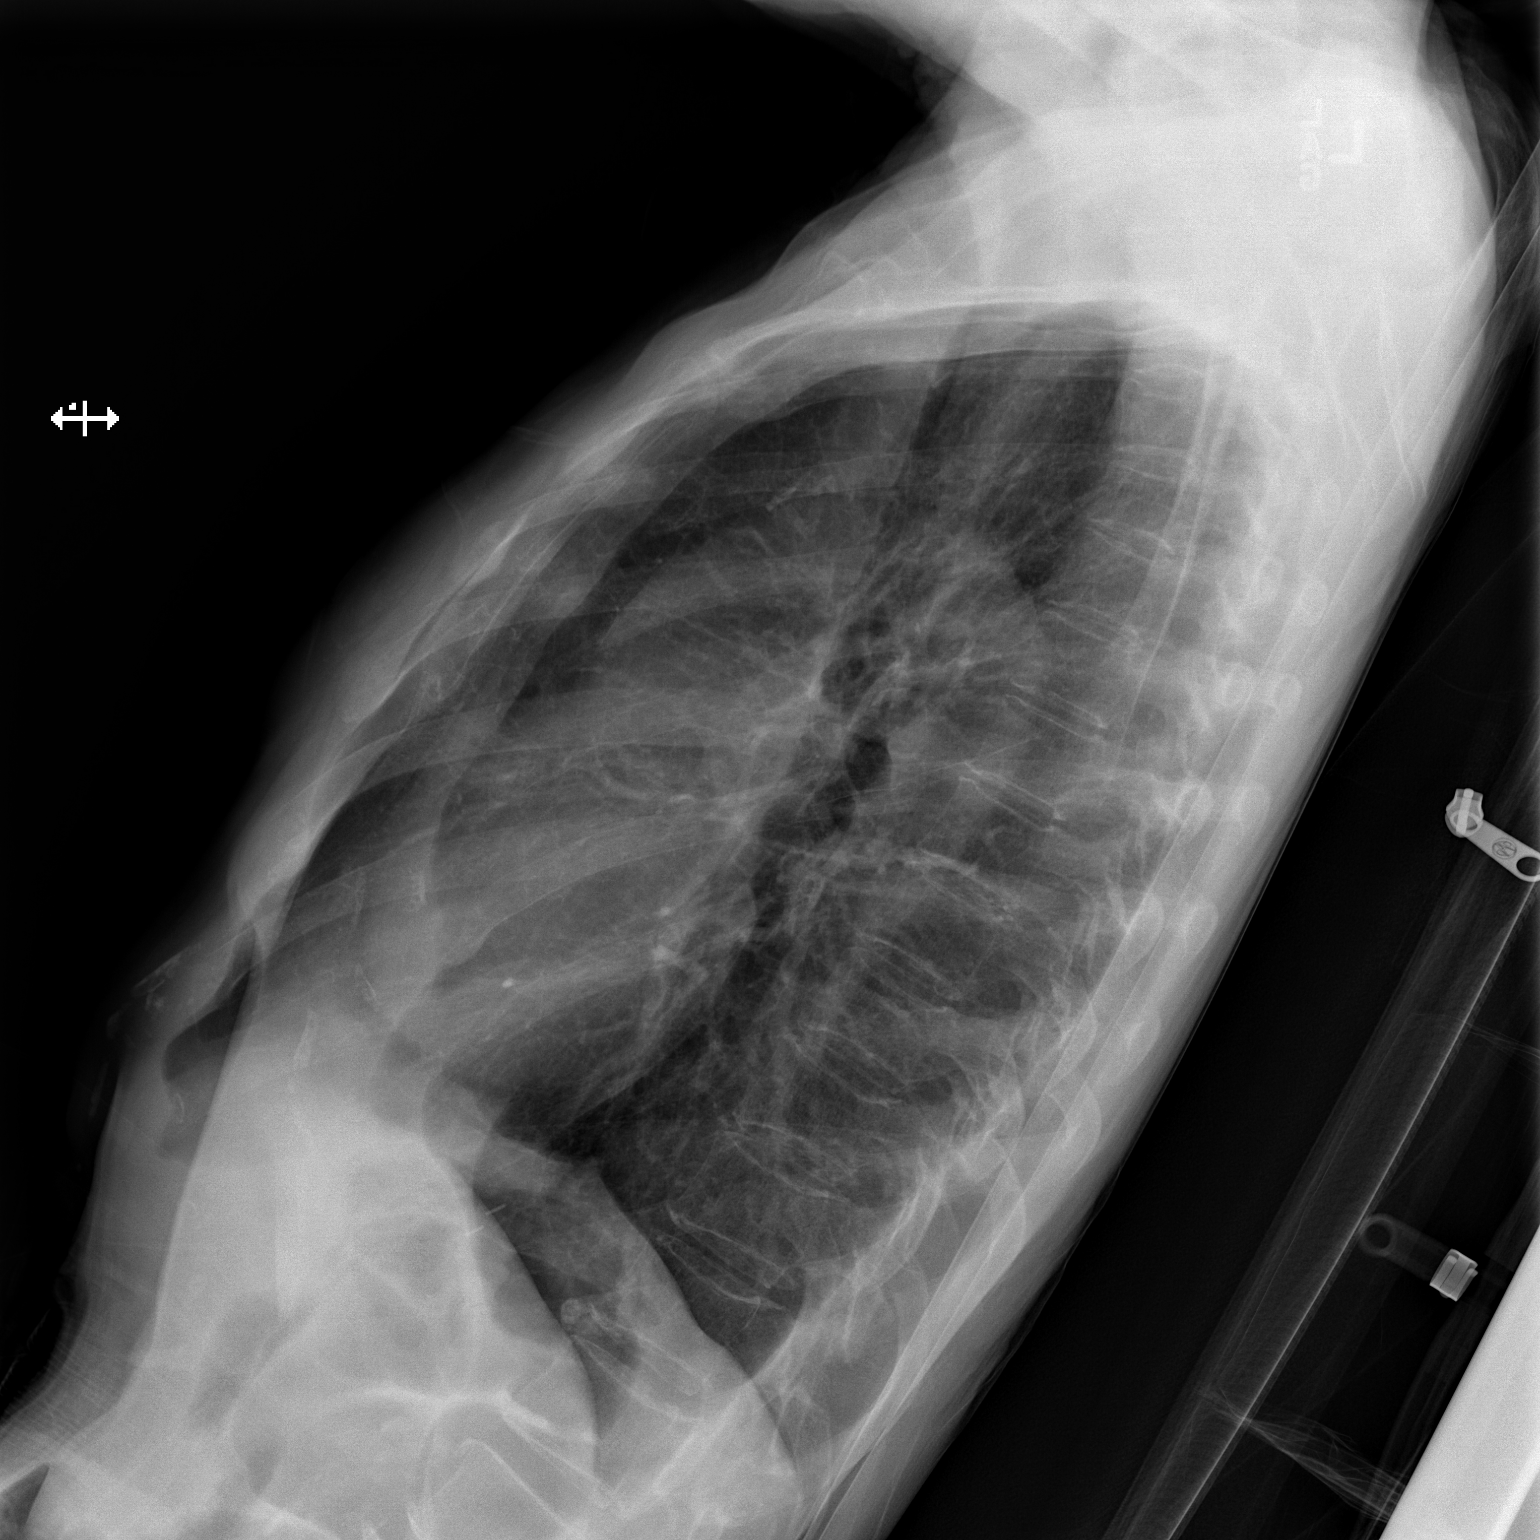

[x chest ap]
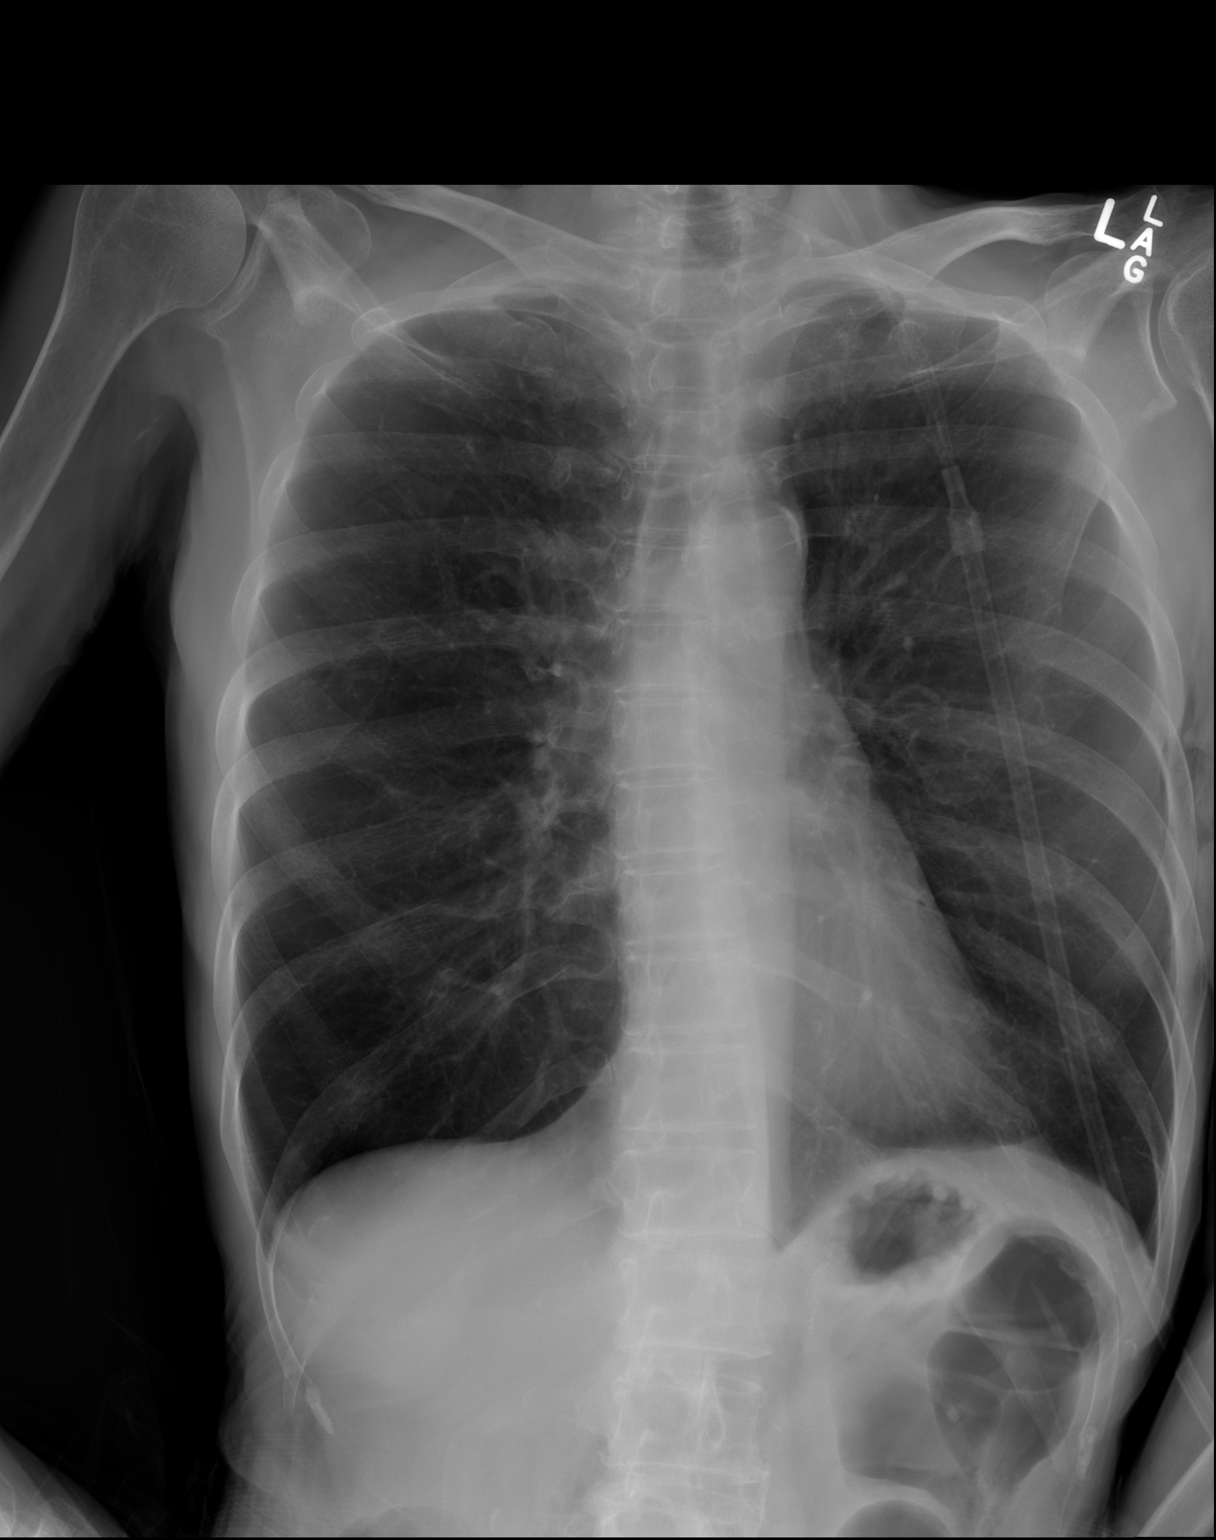

[2 of 2 positions shown; findings below may reference images not displayed]

FINDINGS: No acute consolidative airspace disease. No pleural effusions.
Nausea its of pulmonary edema. Heart size is normal. Upper
mediastinal contours are within normal limits. Atherosclerosis in
the thoracic aorta.
IMPRESSION: 1. No radiographic evidence of acute cardiopulmonary disease.
2. Atherosclerosis.

## 2015-10-11 IMAGING — US US ABDOMEN LIMITED
1 series · 14 of 25 positions shown · non-contrast
Comparison: DG ABD PORTABLE 1V dated 03/10/2013; CT ABD/PELVIS W CM
dated 02/09/2013

CLINICAL DATA: Pancreatitis, abdominal pain

EXAM:
US ABDOMEN LIMITED - RIGHT UPPER QUADRANT

[Series 1: us abdomen limited · 0.21mm/px · 14 of 46 slices shown]
[im 1/46]
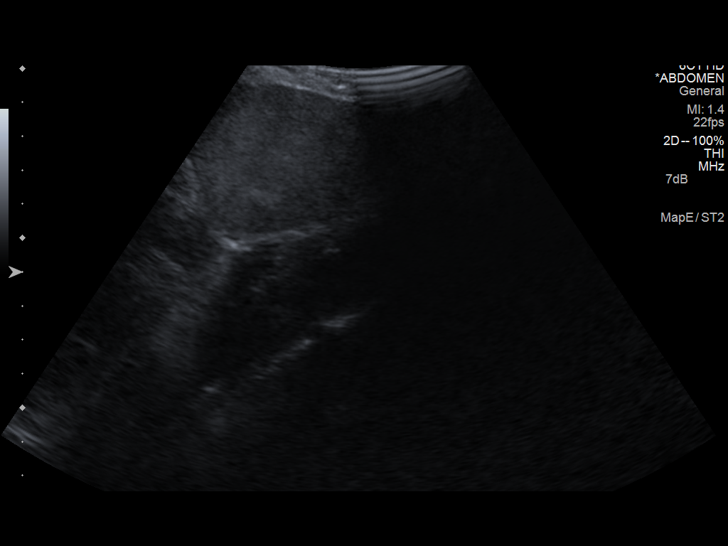
[im 4/46]
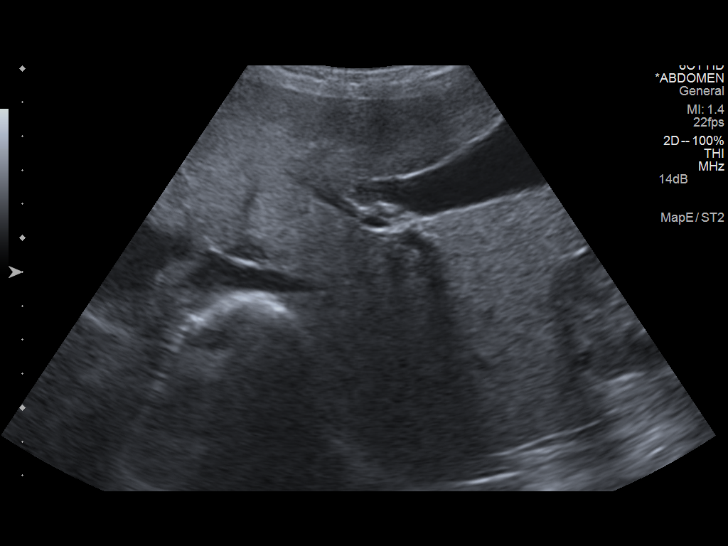
[im 8/46]
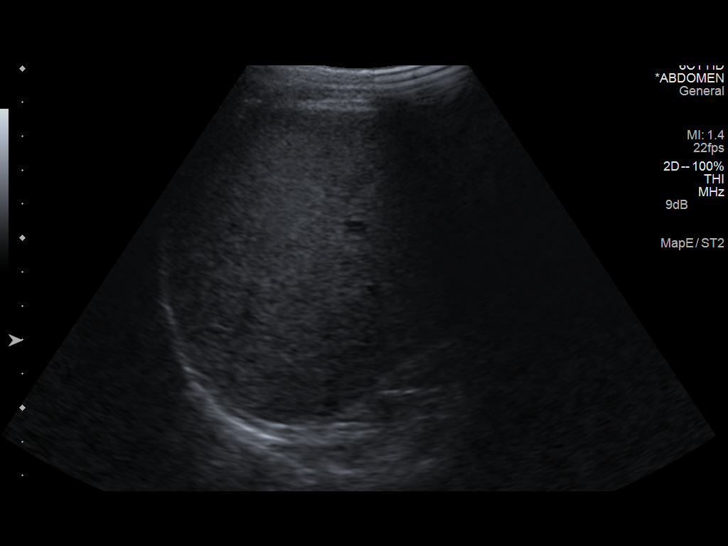
[im 12/46]
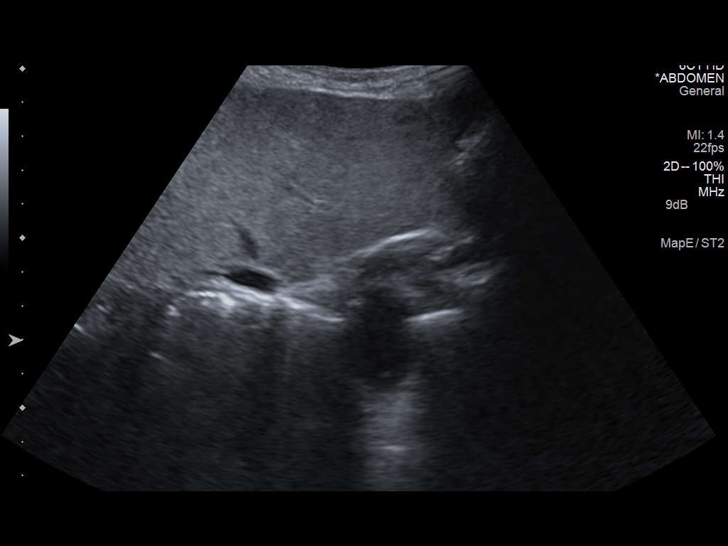
[im 16/46]
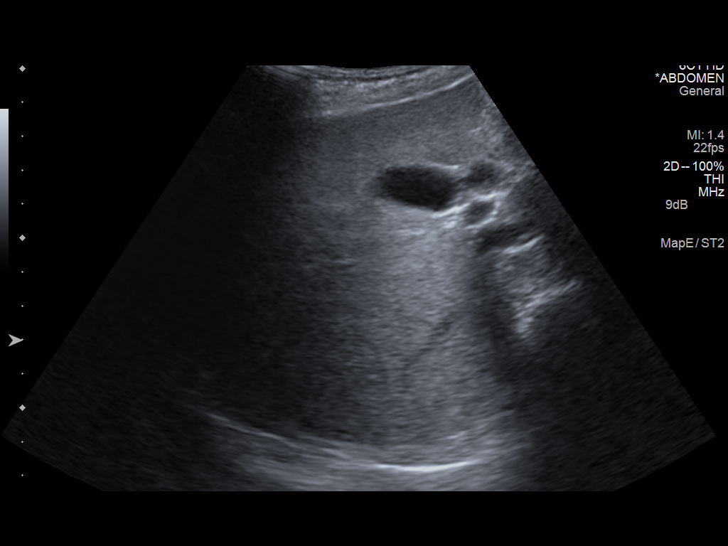
[im 17/46]
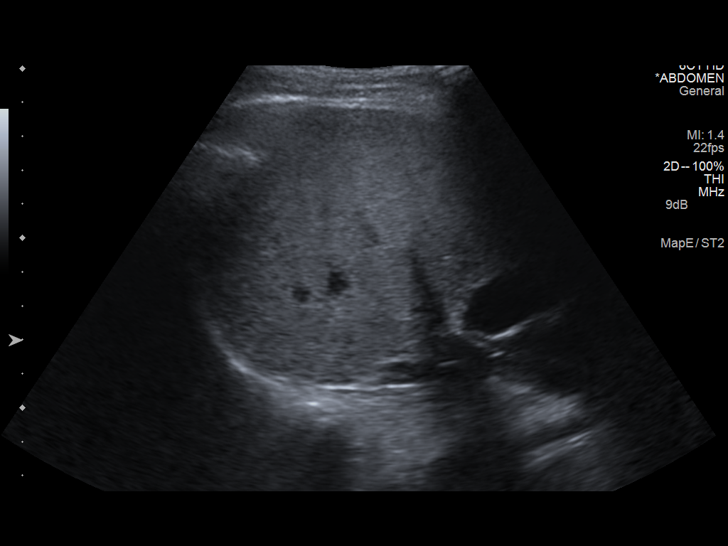
[im 21/46]
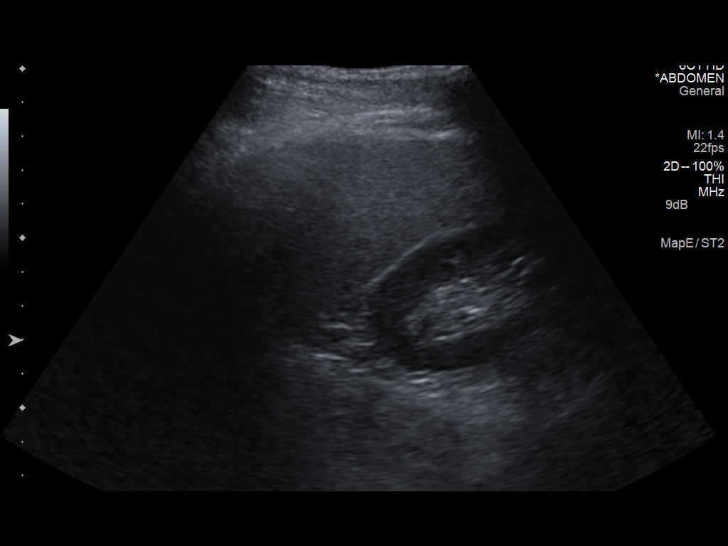
[im 25/46]
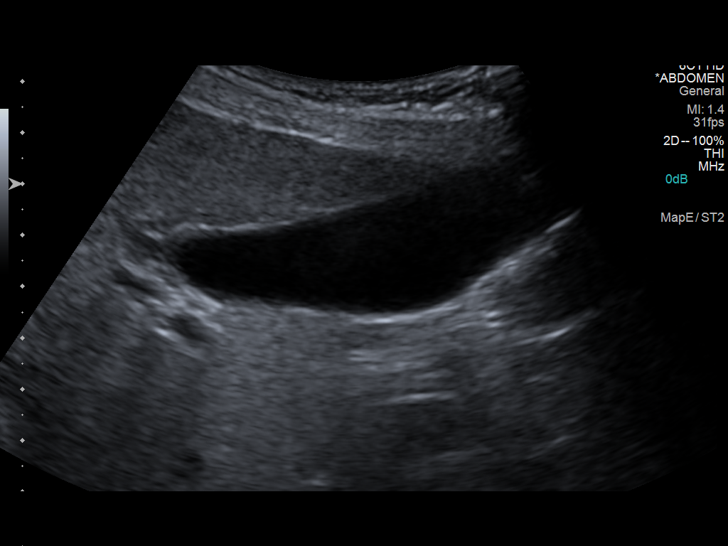
[im 29/46]
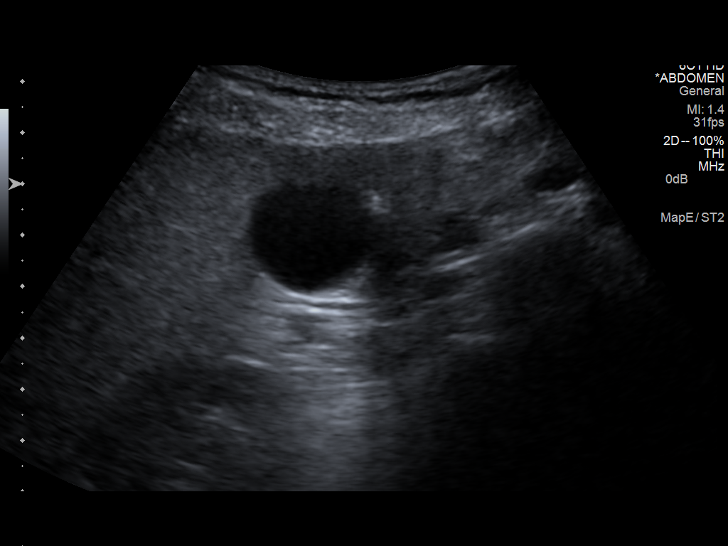
[im 31/46]
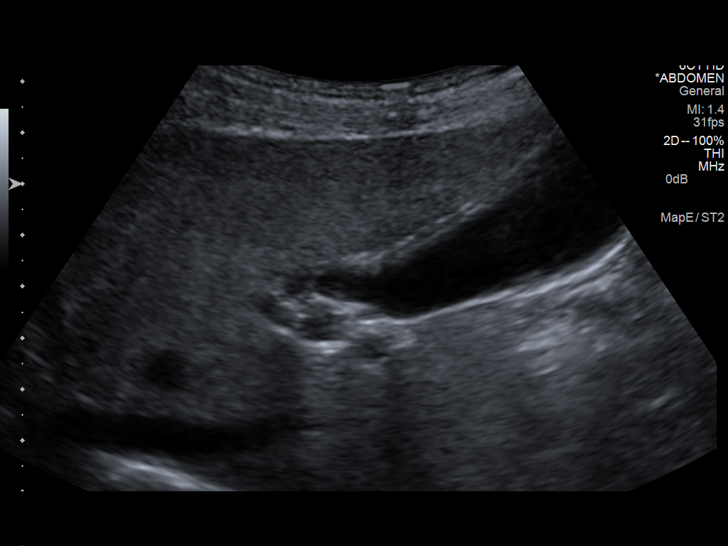
[im 34/46]
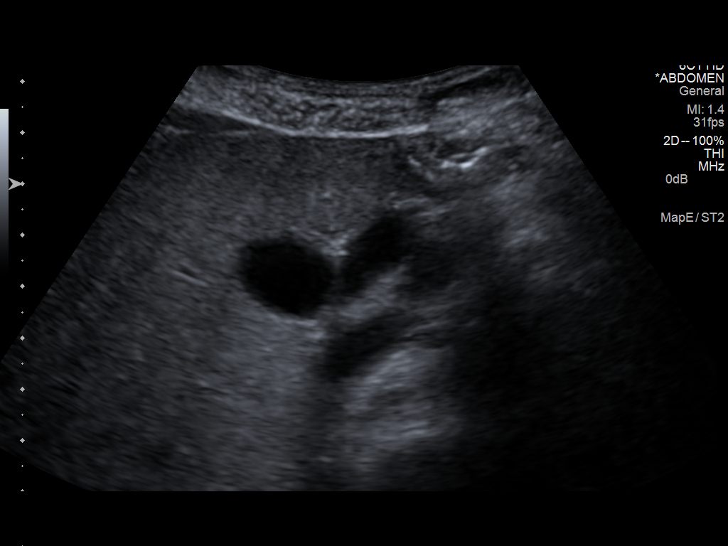
[im 38/46]
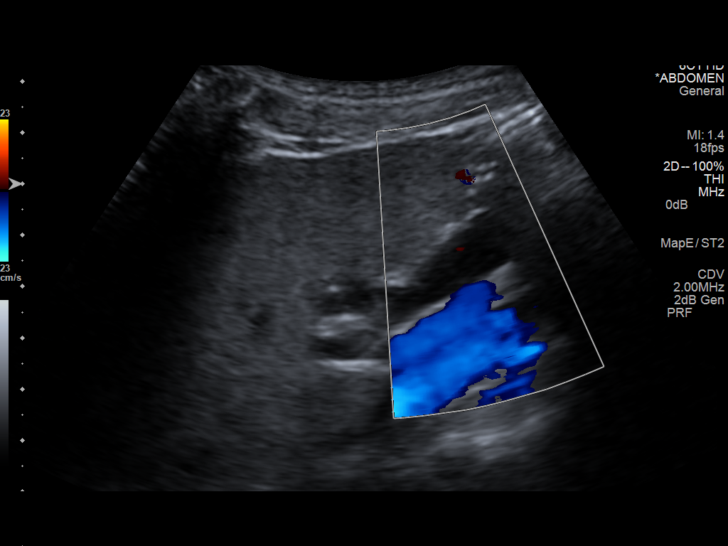
[im 42/46]
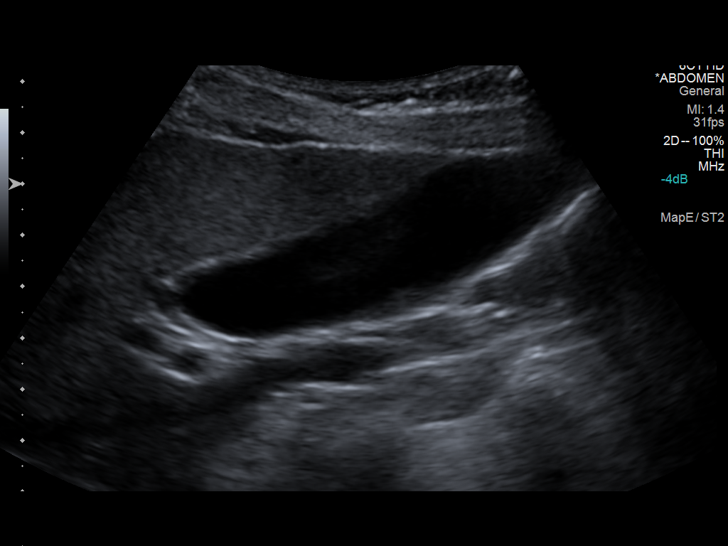
[im 46/46]
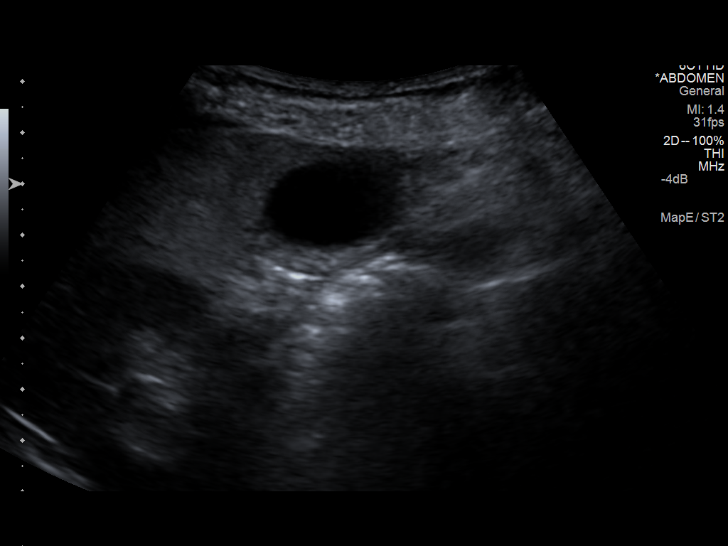

[14 of 25 positions shown; findings below may reference images not displayed]

FINDINGS: Gallbladder:

No gallbladder wall thickening or pericholecystic fluid. No
echogenic gallstones. Negative sonographic Murphy's sign.

Common bile duct:

Diameter: Common bile duct is dilated to 9 to 12 mm. No obstructing
lesion identified.

Liver:

Liver is increased in echogenicity.  At the root of dilatation.
IMPRESSION: 1. Dilatation of the common bile duct. If patient has elevated liver
function tests (bilirubin) consider for MRCP further evaluation.

2.  Echogenic liver consistent with given history of cirrhosis

## 2015-10-20 ENCOUNTER — Emergency Department (HOSPITAL_COMMUNITY)
Admission: EM | Admit: 2015-10-20 | Discharge: 2015-10-21 | Disposition: A | Discharge status: Home / Self Care | Payer: Medicaid Other | Attending: Emergency Medicine | Admitting: Emergency Medicine

## 2015-10-20 DIAGNOSIS — R1013 Epigastric pain: Secondary | ICD-10-CM

## 2015-10-20 DIAGNOSIS — Z79899 Other long term (current) drug therapy: Secondary | ICD-10-CM | POA: Insufficient documentation

## 2015-10-20 DIAGNOSIS — I10 Essential (primary) hypertension: Secondary | ICD-10-CM | POA: Diagnosis not present

## 2015-10-20 DIAGNOSIS — Z8553 Personal history of malignant neoplasm of renal pelvis: Secondary | ICD-10-CM | POA: Insufficient documentation

## 2015-10-20 DIAGNOSIS — J441 Chronic obstructive pulmonary disease with (acute) exacerbation: Secondary | ICD-10-CM | POA: Diagnosis not present

## 2015-10-20 LAB — COMPREHENSIVE METABOLIC PANEL
ALK PHOS: 93 U/L (ref 38–126)
ALT: 17 U/L (ref 14–54)
ANION GAP: 13 (ref 5–15)
AST: 22 U/L (ref 15–41)
Albumin: 4.3 g/dL (ref 3.5–5.0)
BUN: 8 mg/dL (ref 6–20)
CALCIUM: 9.6 mg/dL (ref 8.9–10.3)
CHLORIDE: 102 mmol/L (ref 101–111)
CO2: 22 mmol/L (ref 22–32)
CREATININE: 0.7 mg/dL (ref 0.44–1.00)
Glucose, Bld: 100 mg/dL — ABNORMAL HIGH (ref 65–99)
Potassium: 3.7 mmol/L (ref 3.5–5.1)
SODIUM: 137 mmol/L (ref 135–145)
Total Bilirubin: 0.5 mg/dL (ref 0.3–1.2)
Total Protein: 7.6 g/dL (ref 6.5–8.1)

## 2015-10-20 LAB — CBC
HCT: 40 % (ref 36.0–46.0)
HEMOGLOBIN: 13.4 g/dL (ref 12.0–15.0)
MCH: 30.8 pg (ref 26.0–34.0)
MCHC: 33.5 g/dL (ref 30.0–36.0)
MCV: 92 fL (ref 78.0–100.0)
PLATELETS: 179 10*3/uL (ref 150–400)
RBC: 4.35 MIL/uL (ref 3.87–5.11)
RDW: 14.2 % (ref 11.5–15.5)
WBC: 8.6 10*3/uL (ref 4.0–10.5)

## 2015-10-20 LAB — LIPASE, BLOOD: Lipase: 16 U/L (ref 11–51)

## 2015-10-20 MED ORDER — SODIUM CHLORIDE 0.9 % IV BOLUS (SEPSIS)
500.0000 mL | Freq: Once | INTRAVENOUS | Status: AC
  Administered 2015-10-21: 500 mL via INTRAVENOUS

## 2015-10-20 MED ORDER — ONDANSETRON HCL 4 MG/2ML IJ SOLN
4.0000 mg | Freq: Once | INTRAMUSCULAR | Status: DC
  Filled 2015-10-20: qty 2

## 2015-10-20 MED ORDER — HYDROMORPHONE HCL 1 MG/ML IJ SOLN
1.0000 mg | Freq: Once | INTRAMUSCULAR | Status: DC
  Filled 2015-10-20: qty 1

## 2015-10-20 NOTE — ED Notes (Signed)
MD at bedside. 

## 2015-10-20 NOTE — ED Provider Notes (Signed)
[facility] DEPT Provider Note   CSN: [id number] Arrival date & time: 10/20/15  1807  By signing my name below, I, Royce Macadamia, attest that this documentation has been prepared under the direction and in the presence of Veryl Speak, MD . Electronically Signed: Royce Macadamia, Scribe. 10/20/2015. 11:57 PM.  History   Chief Complaint Chief Complaint  Patient presents with  . Abdominal Pain    The history is provided by the patient and medical records. No language interpreter was used.  Abdominal Pain   This is a recurrent problem. The current episode started yesterday. The problem occurs constantly. The problem has been gradually worsening. The pain is located in the generalized abdominal region. The pain is moderate. Associated symptoms include fever, nausea and vomiting. Pertinent negatives include diarrhea and hematuria. Nothing aggravates the symptoms. Nothing relieves the symptoms. Past workup includes GI consult and surgery.     HPI Comments:  [name] is a 65 y.o. female who presents to the Emergency Department complaining of gradually worsenning abdominal pain, vomiting and nausea onset yesterday.  Pt reports she could manage it yesterday, but not tonight.  She notes associated intermittent fever.  Pt has had similar pains in the past.  She reports a history of partial gastrectomy 5 years ago and pancreatitis.  She states this feels like pancreatitis.  Pt reports Dr. Alyson Ingles prescribes her Dilaudid.  Pt denies diarrhea and blood in her stool.    Past Medical History:  Diagnosis Date  . Cancer ([place])    renal ca  . COPD (chronic obstructive pulmonary disease) ([place])   . Pancreatitis   . Pancreatitis   . Seizures ([place])   . Substance abuse     Patient Active Problem List   Diagnosis Date Noted  . Cough 09/03/2015  . Loss of weight 09/03/2015  . Abdominal pain 01/03/2015  . Hyponatremia 01/03/2015  . Hypocalcemia 01/03/2015  . Underweight 01/03/2015    . COPD GOLD III with reversible component  07/17/2014  . COPD, moderate ([place]) 04/23/2014  . DTs (delirium tremens) ([place]) 06/02/2013  . Chronic alcoholic pancreatitis ([place]) 06/02/2013  . Lactic acidosis 06/02/2013  . Recurrent acute pancreatitis 03/28/2013  . Alcohol withdrawal ([place]) 03/28/2013  . Pancreatitis 03/09/2013  . Tobacco abuse 03/09/2013  . Alcohol withdrawal syndrome with perceptual disturbance ([place]) 03/09/2013  . Acute alcoholic pancreatitis 73/22/0254  . Benzodiazepine withdrawal ([place]) 02/09/2013  . Protein-calorie malnutrition, severe ([place]) 02/09/2013  . Hypokalemia 02/27/2011  . Abdominal pain, acute 02/25/2011  . Nausea and vomiting 02/25/2011  . Thrombocytopenia ([place]) 02/25/2011  . COPD with acute exacerbation ([place]) 02/25/2011  . HTN (hypertension), malignant 02/25/2011  . KIDNEY CANCER 10/18/2008  . Anxiety state 10/18/2008  . GERD 10/18/2008  . CIRRHOSIS 10/18/2008  . PANCREATITIS, CHRONIC 10/18/2008  . DYSPNEA 10/18/2008  . GASTRIC ULCER, HX OF 10/18/2008    Past Surgical History:  Procedure Laterality Date  . IR GENERIC HISTORICAL  05/08/2015   IR RADIOLOGIST EVAL & MGMT 05/08/2015 Aletta Edouard, MD [facility] INTERV RAD  . KIDNEY SURGERY     removed cancerous lesions  . PARTIAL GASTRECTOMY      OB History    No data available       Home Medications    Prior to Admission medications   Medication Sig Start Date End Date Taking? Authorizing Provider  albuterol (PROVENTIL HFA;VENTOLIN HFA) 108 (90 Base) MCG/ACT inhaler Inhale 2 puffs into the lungs every 6 (six) hours as needed for wheezing or shortness of breath. 02/27/15  Juanito Doom, MD  benzonatate (TESSALON) 200 MG capsule Take 1 capsule (200 mg total) by mouth 3 (three) times daily as needed for cough. 09/03/15   Tammy S Parrett, NP  HYDROmorphone (DILAUDID) 4 MG tablet Take 1 tablet (4 mg total) by mouth every 6 (six) hours as needed for severe pain. 09/23/13   Sherwood Gambler, MD  LORazepam  (ATIVAN) 1 MG tablet Take 1 tablet (1 mg total) by mouth every 6 (six) hours as needed. For anxiety. Patient taking differently: Take 1 mg by mouth every 6 (six) hours as needed for anxiety. For anxiety. 04/01/13   Erline Hau, MD  losartan (COZAAR) 100 MG tablet Take 1 tablet (100 mg total) by mouth daily. 09/03/15   Tammy S Parrett, NP  mometasone-formoterol (DULERA) 200-5 MCG/ACT AERO INHALE 2 PUFFS INTO THE LUNGS TWICE DAILY 02/27/15   Juanito Doom, MD  nicotine (NICODERM CQ - DOSED IN MG/24 HOURS) 14 mg/24hr patch Place 1 patch (14 mg total) onto the skin daily. 09/11/15   Juanito Doom, MD  nicotine (NICOTROL) 10 MG inhaler Inhale 1 cartridge (1 continuous puffing total) into the lungs as needed for smoking cessation. 09/03/15   Melvenia Needles, NP  nicotine polacrilex (NICORETTE) 4 MG lozenge Per package directions 09/09/15   Juanito Doom, MD  ondansetron (ZOFRAN) 4 MG tablet Take 4 mg by mouth 3 (three) times daily as needed for nausea or vomiting.  05/09/14   Historical Provider, MD  tiotropium (SPIRIVA HANDIHALER) 18 MCG inhalation capsule Place 1 capsule (18 mcg total) into inhaler and inhale daily. 02/27/15   Juanito Doom, MD    Family History Family History  Problem Relation Age of Onset  . CAD Mother   . Alcoholism Father   . COPD Father   . COPD Father     Social History Social History  Substance Use Topics  . Smoking status: Current Every Day Smoker    Packs/day: 1.00    Years: 30.00    Types: Cigarettes  . Smokeless tobacco: Never Used     Comment: smoking up to 1.5 ppd per husband.   . Alcohol use 0.0 oz/week     Allergies   Aspirin and Chlorpromazine hcl   Review of Systems Review of Systems  Constitutional: Positive for fever.  Gastrointestinal: Positive for abdominal pain, nausea and vomiting. Negative for diarrhea.  Genitourinary: Negative for hematuria.  All other systems reviewed and are negative.    Physical Exam Updated  Vital Signs BP (!) 178/106 (BP Location: Left Arm)   Pulse 84   Temp 98.1 F (36.7 C) (Oral)   Resp 17   Ht '5\' 4"'$  (1.626 m)   Wt 76 lb 3 oz (34.6 kg)   SpO2 100%   BMI 13.08 kg/m   Physical Exam  Constitutional: She is oriented to person, place, and time. No distress.  Patient is cachectic.  She appears uncomfortable.    HENT:  Head: Normocephalic and atraumatic.  Right Ear: Hearing normal.  Left Ear: Hearing normal.  Nose: Nose normal.  Mouth/Throat: Oropharynx is clear and moist and mucous membranes are normal.  Eyes: Conjunctivae and EOM are normal. Pupils are equal, round, and reactive to light.  Neck: Normal range of motion. Neck supple.  Cardiovascular: Regular rhythm, S1 normal and S2 normal.  Exam reveals no gallop and no friction rub.   No murmur heard. Pulmonary/Chest: Effort normal and breath sounds normal. No respiratory distress. She exhibits no tenderness.  Abdominal: Soft. Normal appearance and bowel sounds are normal. There is no hepatosplenomegaly. There is tenderness. There is no rebound, no guarding, no tenderness at McBurney's point and negative Murphy's sign. No hernia.  TTP in the epigastric region.    Musculoskeletal: Normal range of motion.  Neurological: She is alert and oriented to person, place, and time. She has normal strength. No cranial nerve deficit or sensory deficit. Coordination normal. GCS eye subscore is 4. GCS verbal subscore is 5. GCS motor subscore is 6.  Skin: Skin is warm, dry and intact. No rash noted. No cyanosis.  Psychiatric: She has a normal mood and affect. Her speech is normal and behavior is normal. Thought content normal.  Nursing note and vitals reviewed.    ED Treatments / Results   DIAGNOSTIC STUDIES:  Oxygen Saturation is 100% on RA, NML by my interpretation.    COORDINATION OF CARE:  11:56 PM Discussed treatment plan with pt at bedside and pt agreed to plan.  Labs (all labs ordered are listed, but only abnormal  results are displayed) Labs Reviewed  COMPREHENSIVE METABOLIC PANEL - Abnormal; Notable for the following:       Result Value   Glucose, Bld 100 (*)    All other components within normal limits  LIPASE, BLOOD  CBC  URINALYSIS, ROUTINE W REFLEX MICROSCOPIC (NOT AT [facility])    EKG  EKG Interpretation None       Radiology No results found.  Procedures Procedures (including critical care time)  Medications Ordered in ED Medications  HYDROmorphone (DILAUDID) injection 1 mg (not administered)  ondansetron (ZOFRAN) injection 4 mg (not administered)  sodium chloride 0.9 % bolus 500 mL (not administered)     Initial Impression / Assessment and Plan / ED Course  I have reviewed the triage vital signs and the nursing notes.  Pertinent labs & imaging results that were available during my care of the patient were reviewed by me and considered in my medical decision making (see chart for details).  Clinical Course    Patient is a 65 year old female with history of chronic abdominal pain. She presents for a worsening of her discomfort. She complains of severe pain in the epigastric region. Her laboratory studies are unremarkable and CT scan shows no evidence for any acute intra-abdominal pathology. I see no reason for admission or further workup. She will be discharged to home and advised to take her home medications and follow-up with her primary Dr. if not improving.  Final Clinical Impressions(s) / ED Diagnoses   Final diagnoses:  None    New Prescriptions New Prescriptions   No medications on file   I personally performed the services described in this documentation, which was scribed in my presence. The recorded information has been reviewed and is accurate.        Veryl Speak, MD 10/21/15 [phone]

## 2015-10-20 NOTE — ED Notes (Signed)
Attempted IV x1.  Made charge RN aware of difficult IV stick.  She will attempt.

## 2015-10-20 NOTE — ED Triage Notes (Signed)
Pt states that she has chronic abdominal pain and takes dilaudid for this at home. States that she has had N/V and can't keep her meds down. Thinks she may have pancreatitis as well. Alert and oriented.

## 2015-10-21 ENCOUNTER — Encounter (HOSPITAL_COMMUNITY): Payer: Self-pay

## 2015-10-21 ENCOUNTER — Emergency Department (HOSPITAL_COMMUNITY): Payer: Medicaid Other

## 2015-10-21 LAB — URINALYSIS, ROUTINE W REFLEX MICROSCOPIC
Bilirubin Urine: NEGATIVE
GLUCOSE, UA: NEGATIVE mg/dL
Ketones, ur: NEGATIVE mg/dL
Nitrite: NEGATIVE
PROTEIN: 30 mg/dL — AB
Specific Gravity, Urine: 1.01 (ref 1.005–1.030)
pH: 7.5 (ref 5.0–8.0)

## 2015-10-21 LAB — URINE MICROSCOPIC-ADD ON

## 2015-10-21 MED ORDER — HYDROMORPHONE HCL 1 MG/ML IJ SOLN
1.0000 mg | Freq: Once | INTRAMUSCULAR | Status: AC
  Administered 2015-10-21: 1 mg via INTRAVENOUS
  Filled 2015-10-21: qty 1

## 2015-10-21 MED ORDER — IOPAMIDOL (ISOVUE-300) INJECTION 61%
15.0000 mL | Freq: Once | INTRAVENOUS | Status: AC | PRN
  Administered 2015-10-21: 15 mL via ORAL

## 2015-10-21 MED ORDER — HYDROMORPHONE HCL 1 MG/ML IJ SOLN
1.0000 mg | Freq: Once | INTRAMUSCULAR | Status: AC
  Administered 2015-10-21: 1 mg via INTRAMUSCULAR
  Filled 2015-10-21: qty 1

## 2015-10-21 MED ORDER — ONDANSETRON HCL 4 MG/2ML IJ SOLN
4.0000 mg | Freq: Once | INTRAMUSCULAR | Status: AC
  Administered 2015-10-21: 4 mg via INTRAVENOUS
  Filled 2015-10-21: qty 2

## 2015-10-21 MED ORDER — IOPAMIDOL (ISOVUE-300) INJECTION 61%
100.0000 mL | Freq: Once | INTRAVENOUS | Status: AC | PRN
  Administered 2015-10-21: 70 mL via INTRAVENOUS

## 2015-10-21 MED ORDER — ONDANSETRON 4 MG PO TBDP
4.0000 mg | ORAL_TABLET | Freq: Once | ORAL | Status: AC
  Administered 2015-10-21: 4 mg via ORAL
  Filled 2015-10-21: qty 1

## 2015-10-21 NOTE — ED Notes (Signed)
IV team at bedside 

## 2015-10-21 NOTE — ED Notes (Signed)
Pt requested more pain medication.  Made MD aware.

## 2015-10-21 NOTE — Discharge Instructions (Signed)
Continue your home medications as before.  Follow-up with your primary Dr. if not improving in the next 2-3 days.

## 2015-10-21 NOTE — ED Notes (Signed)
Patient transported to CT 

## 2015-10-29 ENCOUNTER — Encounter (HOSPITAL_COMMUNITY): Payer: Self-pay | Admitting: Emergency Medicine

## 2015-11-07 ENCOUNTER — Other Ambulatory Visit: Payer: Self-pay | Admitting: Pulmonary Disease

## 2015-12-01 ENCOUNTER — Other Ambulatory Visit: Payer: Self-pay | Admitting: Pulmonary Disease

## 2015-12-01 ENCOUNTER — Ambulatory Visit (INDEPENDENT_AMBULATORY_CARE_PROVIDER_SITE_OTHER): Payer: Medicaid Other | Admitting: Pulmonary Disease

## 2015-12-01 ENCOUNTER — Encounter: Payer: Self-pay | Admitting: Pulmonary Disease

## 2015-12-01 DIAGNOSIS — R05 Cough: Secondary | ICD-10-CM

## 2015-12-01 DIAGNOSIS — Z720 Tobacco use: Secondary | ICD-10-CM

## 2015-12-01 DIAGNOSIS — J449 Chronic obstructive pulmonary disease, unspecified: Secondary | ICD-10-CM | POA: Diagnosis not present

## 2015-12-01 DIAGNOSIS — R059 Cough, unspecified: Secondary | ICD-10-CM

## 2015-12-01 MED ORDER — BENZONATATE 200 MG PO CAPS: 200.0000 mg | ORAL_CAPSULE | Freq: Three times a day (TID) | ORAL | 1 refills | Status: DC | PRN

## 2015-12-01 NOTE — Patient Instructions (Signed)
Check with your pharmacist to see if there is an appeal for the Nicotrol inhaler Quit smoking Keep taking Dulera and Spiriva I will see you back in 6 months or sooner if needed

## 2015-12-01 NOTE — Assessment & Plan Note (Signed)
Again counseled at length to quit smoking today. However, this is been a stable interval despite her severe disease.  Plan: Continue Dulera and Spiriva Flu shot is up-to-date

## 2015-12-01 NOTE — Progress Notes (Signed)
Subjective:    Patient ID: [name], female    DOB: 1950-03-19, 65 y.o.   MRN: [id number]  Synopsis: Gold grade 4 COPD PFT 07/17/2014 FEV1 0.95 ((36%)  Ratio 49 p 25% resp to saba  DLCO 49% Still smoking as of 09/02/2014    HPI Chief Complaint  Patient presents with  . Follow-up    pt states she is doing well, no new complaints today.    [name] never gave [place] any mucus after the last visit.  She says that she really isn't producing mucus any more.  She will have an occasion coughing spell, but not too bad or often.  Dyspnea: > about the same as the last visit > when she walks she gets short of breath > she says she can only walk a few feet or maybe for one minute before she has to stop  Cough: > improved with tessalon perles  COPD: > no flares since the last visit > Still taking Dulera and Spiriva, no problems with them  Tobacco abuse: > still smoking 1 pack per day > has cut back from 2 packs per day > she tried the nicotine patches but medicaid wouldn't pay for the nicotrol inhaler   Past Medical History:  Diagnosis Date  . Cancer ([place])    renal ca  . COPD (chronic obstructive pulmonary disease) ([place])   . Pancreatitis   . Pancreatitis   . Seizures ([place])   . Substance abuse       Review of Systems  Constitutional: Positive for fatigue. Negative for chills and fever.  HENT: Negative for nosebleeds, postnasal drip and rhinorrhea.   Respiratory: Positive for cough, shortness of breath and wheezing.   Cardiovascular: Negative for chest pain, palpitations and leg swelling.       Objective:   Physical Exam Vitals:   12/01/15 1331  BP: 126/74  Pulse: 95  SpO2: 93%  Weight: 77 lb (34.9 kg)  Height: '5\' 4"'$  (1.626 m)   RA  Gen: frail, thin, chronically ill appearing HENT: OP clear, , neck supple PULM: Wheezing only left upper lobe, good air movement CV: RRR, no mgr, trace edema GI: BS+, soft, nontender Derm: no cyanosis or rash Psyche: normal  mood and affect  Last CXR 10/2015 > showed emphysema  CBC    Component Value Date/Time   WBC 8.6 10/20/2015 1900   RBC 4.35 10/20/2015 1900   HGB 13.4 10/20/2015 1900   HCT 40.0 10/20/2015 1900   PLT 179 10/20/2015 1900   MCV 92.0 10/20/2015 1900   MCH 30.8 10/20/2015 1900   MCHC 33.5 10/20/2015 1900   RDW 14.2 10/20/2015 1900   LYMPHSABS 1.4 02/13/2015 1212   MONOABS 0.7 02/13/2015 1212   EOSABS 0.1 02/13/2015 1212   BASOSABS 0.0 02/13/2015 1212          Assessment & Plan:  Tobacco abuse Counseled at length to quit today. She says that Medicaid will not cover the Nicotrol inhaler. I asked her to check with her pharmacist about this and we would be willing to fill out an appeal as I think this would help her quit smoking.  COPD GOLD III with reversible component  Again counseled at length to quit smoking today. However, this is been a stable interval despite her severe disease.  Plan: Continue Dulera and Spiriva Flu shot is up-to-date  Cough Refill Tessalon Perles    Current Outpatient Prescriptions:  .  albuterol (PROVENTIL HFA;VENTOLIN HFA) 108 (90 Base) MCG/ACT inhaler,  Inhale 2 puffs into the lungs every 6 (six) hours as needed for wheezing or shortness of breath., Disp: 1 Inhaler, Rfl: 5 .  HYDROmorphone (DILAUDID) 4 MG tablet, Take 1 tablet (4 mg total) by mouth every 6 (six) hours as needed for severe pain., Disp: 10 tablet, Rfl: 0 .  LORazepam (ATIVAN) 1 MG tablet, Take 1 tablet (1 mg total) by mouth every 6 (six) hours as needed. For anxiety. (Patient taking differently: Take 1 mg by mouth every 6 (six) hours as needed for anxiety. For anxiety.), Disp: 10 tablet, Rfl: 0 .  losartan (COZAAR) 100 MG tablet, Take 1 tablet (100 mg total) by mouth daily., Disp: 30 tablet, Rfl: 11 .  mometasone-formoterol (DULERA) 200-5 MCG/ACT AERO, INHALE 2 PUFFS INTO THE LUNGS TWICE DAILY, Disp: 13 g, Rfl: 5 .  ondansetron (ZOFRAN) 4 MG tablet, Take 4 mg by mouth 3 (three) times  daily as needed for nausea or vomiting. , Disp: , Rfl: 0 .  SPIRIVA HANDIHALER 18 MCG inhalation capsule, INHALE 1 CAPSULE VIA HANDIHALER ONCE DAILY, Disp: 30 capsule, Rfl: 5 .  benzonatate (TESSALON) 200 MG capsule, Take 1 capsule (200 mg total) by mouth 3 (three) times daily as needed for cough., Disp: 30 capsule, Rfl: 1  Addendum: Will ask a partner or NP from our office to prescribe due to a previously unknown issue with our medicare credentialing office. Roselie Awkward, MD [place] [facility] Pager: [phone] Cell: [phone] After 3pm or if no response, call [phone]

## 2015-12-01 NOTE — Assessment & Plan Note (Signed)
Refill Tessalon Perles

## 2015-12-01 NOTE — Assessment & Plan Note (Signed)
Counseled at length to quit today. She says that Medicaid will not cover the Nicotrol inhaler. I asked her to check with her pharmacist about this and we would be willing to fill out an appeal as I think this would help her quit smoking.

## 2015-12-02 ENCOUNTER — Telehealth: Payer: Self-pay | Admitting: Pulmonary Disease

## 2015-12-02 MED ORDER — BENZONATATE 200 MG PO CAPS: 200.0000 mg | ORAL_CAPSULE | Freq: Three times a day (TID) | ORAL | 1 refills | Status: DC | PRN

## 2015-12-02 NOTE — Telephone Encounter (Signed)
Called and spoke with pt and she stated that it is hard for her to take any type of pills and she would rather have a cough syrup called to her pharmacy to help with the cough. BQ please advise. Thanks  Allergies  Allergen Reactions  . Aspirin Other (See Comments)    bleeding  . Chlorpromazine Hcl Other (See Comments)    Muscle spasms

## 2015-12-02 NOTE — Telephone Encounter (Signed)
That is fine can take Delsym 2 tsp Twice daily  ,As needed  Cough , this is otc  We can not rx narcotic cough syrup as she is on dilaudid .  Please contact office for sooner follow up if symptoms do not improve or worsen or seek emergency care

## 2015-12-02 NOTE — Telephone Encounter (Signed)
Will forward to TP since we have not heard back from [place].  Please advise. TP.  Thanks  Allergies  Allergen Reactions  . Aspirin Other (See Comments)    bleeding  . Chlorpromazine Hcl Other (See Comments)    Muscle spasms

## 2015-12-02 NOTE — Telephone Encounter (Signed)
Calling to check  On the status of med request, please call pt husband when this has been done [phone].[name]

## 2015-12-02 NOTE — Telephone Encounter (Signed)
Called the pharmacy and they stated that they did not get the rx that was sent in to the pharmacy.  I have called the pt and she is aware that this has been called in and they will get this ready for her.

## 2015-12-04 ENCOUNTER — Ambulatory Visit: Payer: Medicaid Other | Admitting: Pulmonary Disease

## 2015-12-15 IMAGING — CR DG CHEST 1V PORT
1 series · 1 of 1 positions shown · non-contrast
Comparison: 03/28/2013 and earlier.

CLINICAL DATA: 62-year-old female shortness of breath. Cough.
Initial encounter.

EXAM:
PORTABLE CHEST - 1 VIEW

[AP]
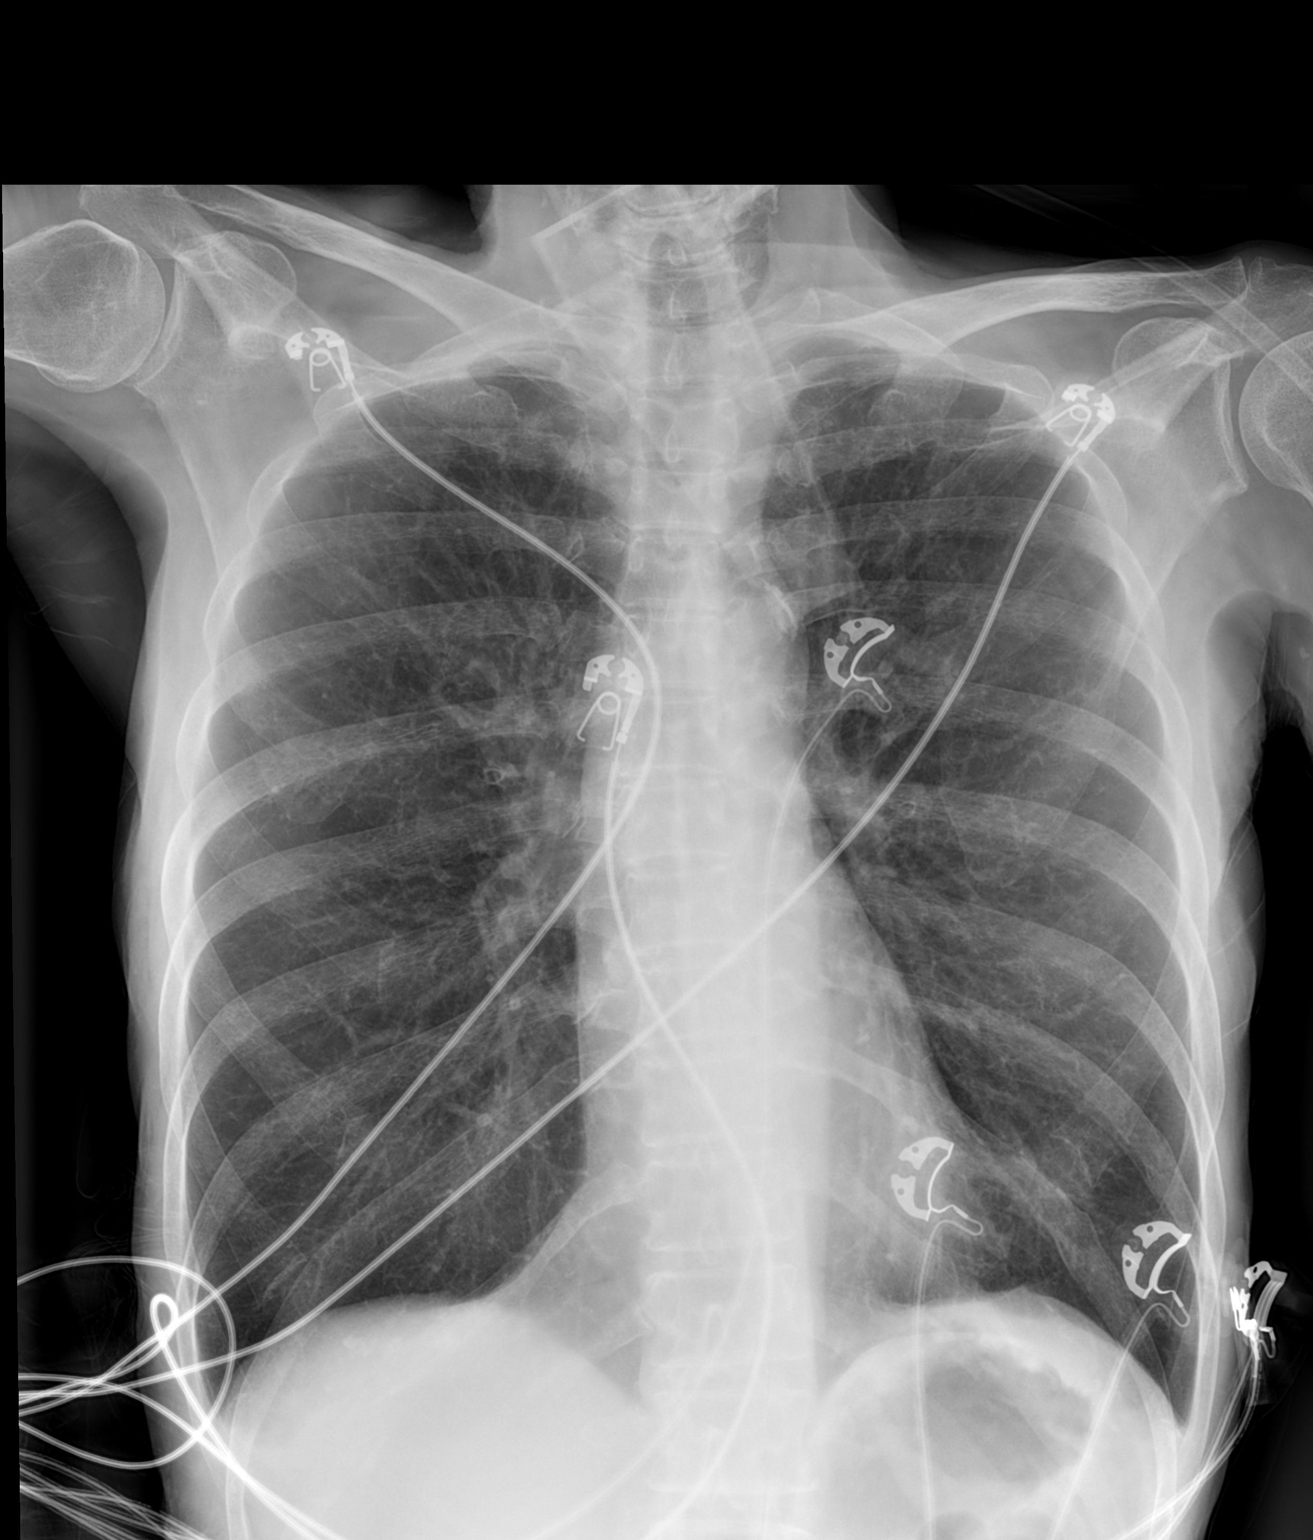

[1 of 1 positions shown; findings below may reference images not displayed]

FINDINGS: Portable AP semi upright view at 3739 hrs. Stable large lung
volumes. Stable cardiac size and mediastinal contours. Visualized
tracheal air column is within normal limits. Stable mild increased
interstitial markings with no pneumothorax, pulmonary edema, pleural
effusion or acute pulmonary opacity.
IMPRESSION: Stable chronic lung disease. No superimposed acute findings are
identified.

## 2015-12-16 IMAGING — CT CT CTA ABD/PEL W/CM AND/OR W/O CM
2 of 10 series · 12 of 46 positions shown, 18 images · IV contrast (OMNIPAQUE)
Comparison: 02/25/2011

CLINICAL DATA: Abdominal pain, elevated lactate

EXAM:
CTA ABDOMEN AND PELVIS wITHOUT AND WITH CONTRAST
TECHNIQUE: Multidetector CT imaging of the abdomen and pelvis was performed
using the standard protocol during bolus administration of
intravenous contrast. Multiplanar reconstructed images and MIPs were
obtained and reviewed to evaluate the vascular anatomy.
CONTRAST:  100mL OMNIPAQUE IOHEXOL 350 MG/ML SOLN

[Series 6: venous · axial · portal-venous · 0.63mm/px · z∈[-700,-364]mm · 10 of 136 slices shown, 15 images]
[im 12/136  soft-tissue]
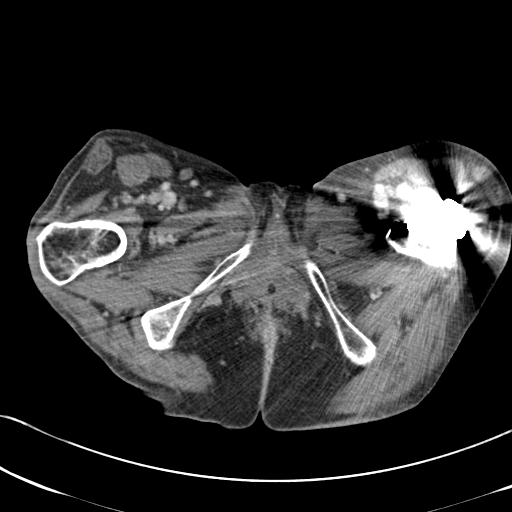
[im 12/136  bone]
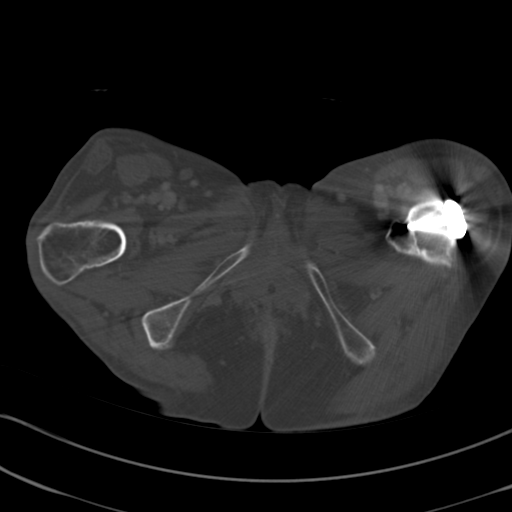
[im 23/136  soft-tissue]
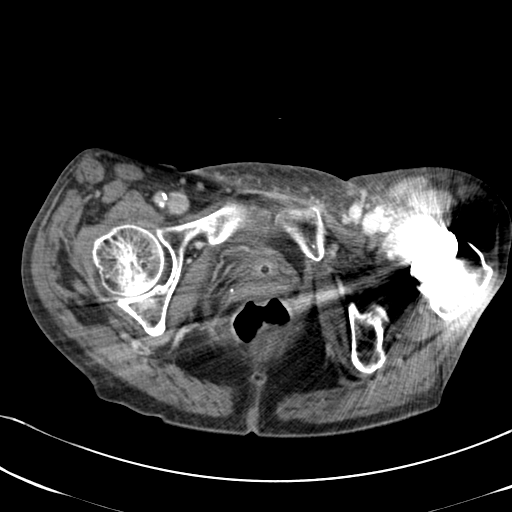
[im 46/136  soft-tissue]
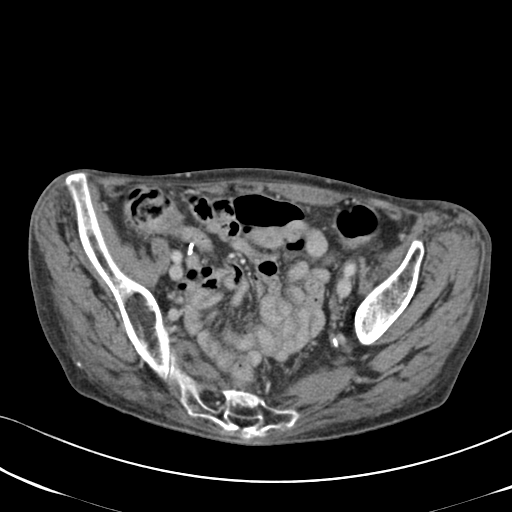
[im 57/136  soft-tissue]
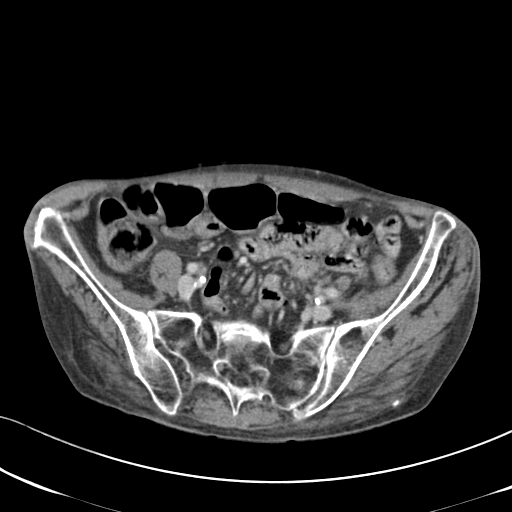
[im 68/136  soft-tissue]
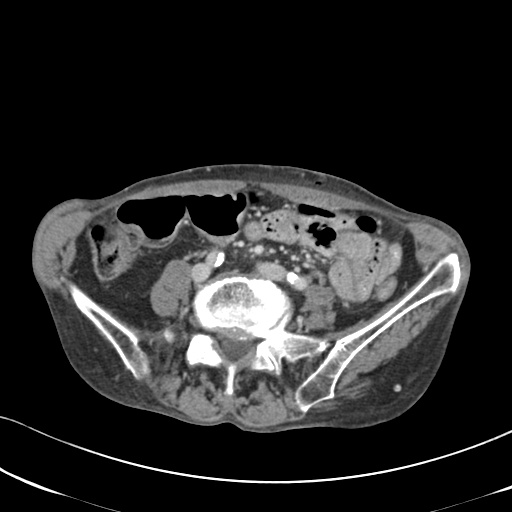
[im 79/136  soft-tissue]
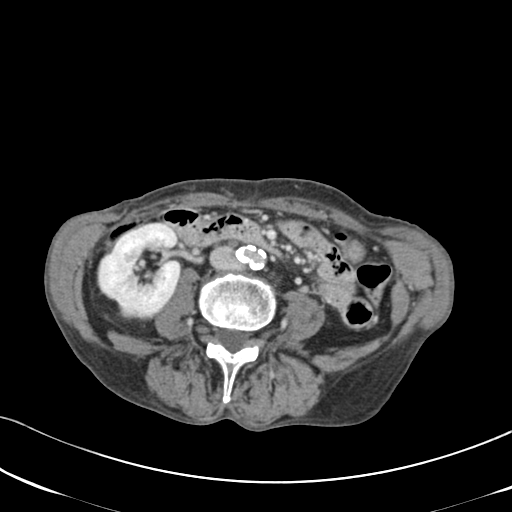
[im 91/136  soft-tissue]
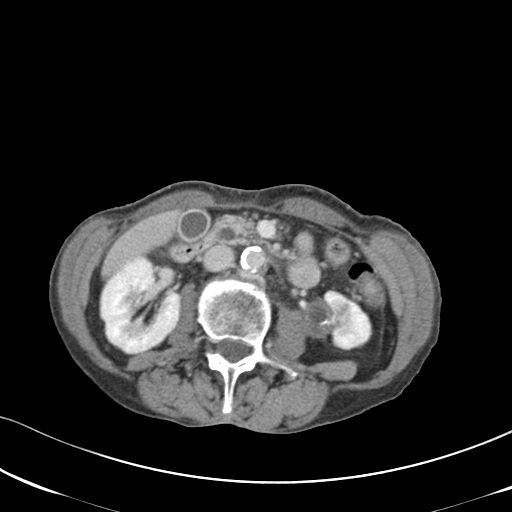
[im 91/136  lung]
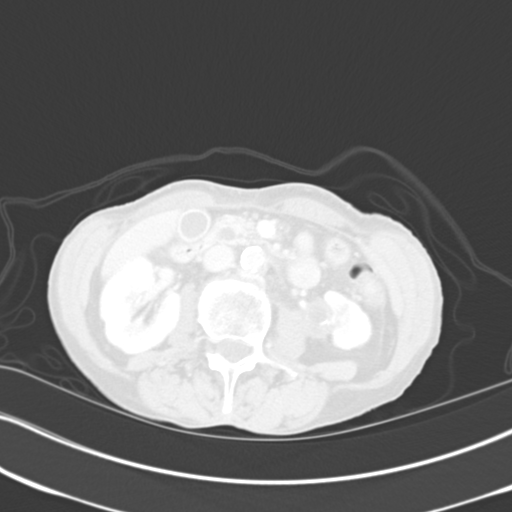
[im 102/136  lung]
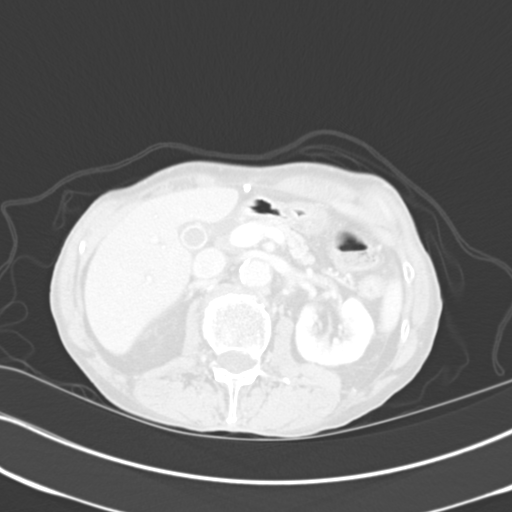
[im 113/136  soft-tissue]
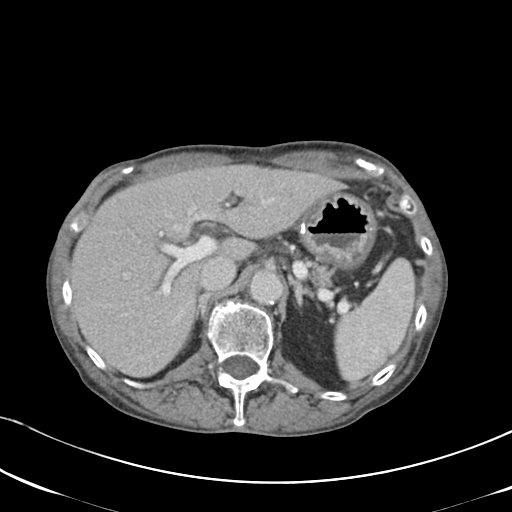
[im 113/136  lung]
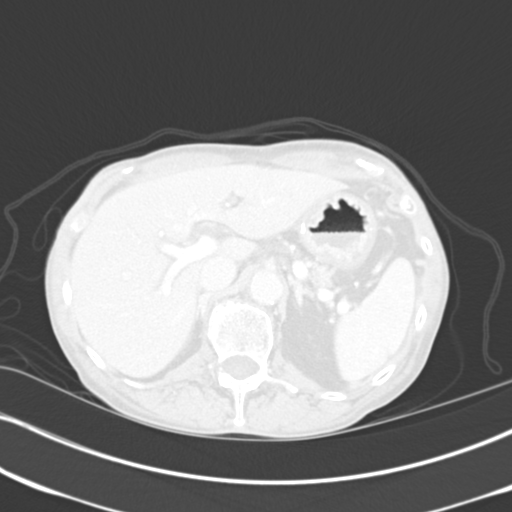
[im 124/136  soft-tissue]
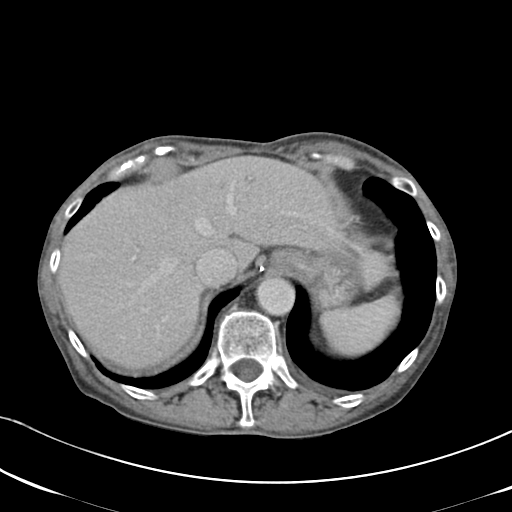
[im 124/136  lung]
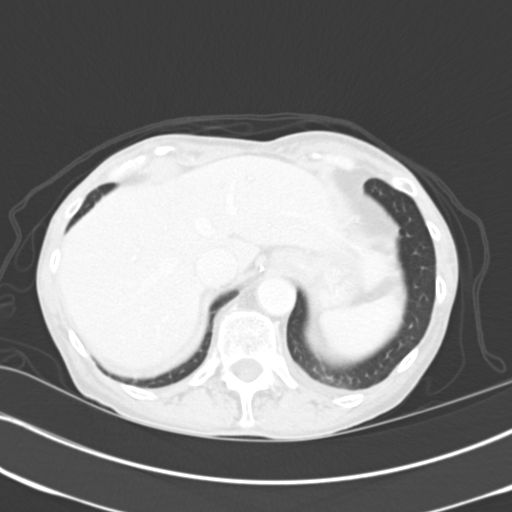
[im 124/136  bone]
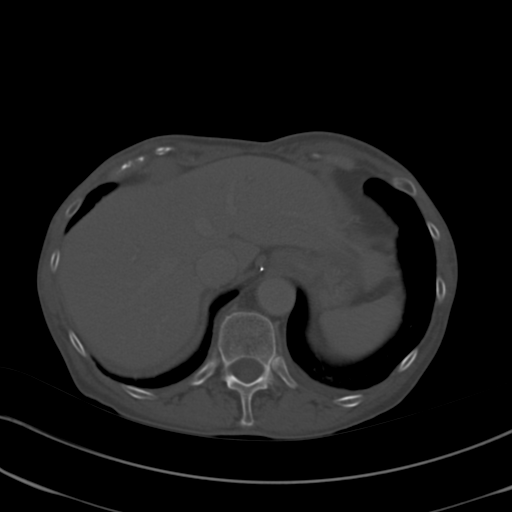

[Series 602: coronal mpr · coronal · 0.78mm/px · 2 of 91 slices shown, 3 images]
[im 31/91  soft-tissue]
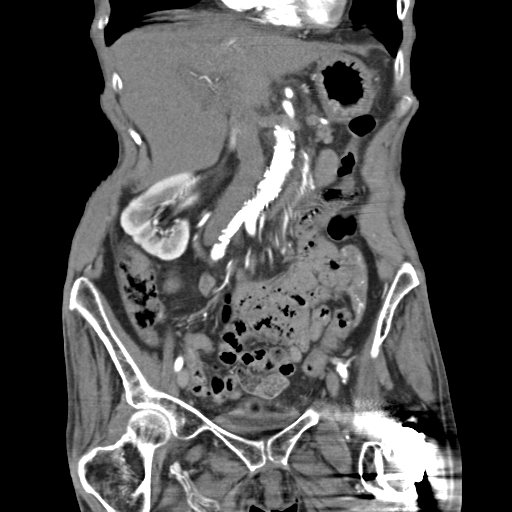
[im 31/91  bone]
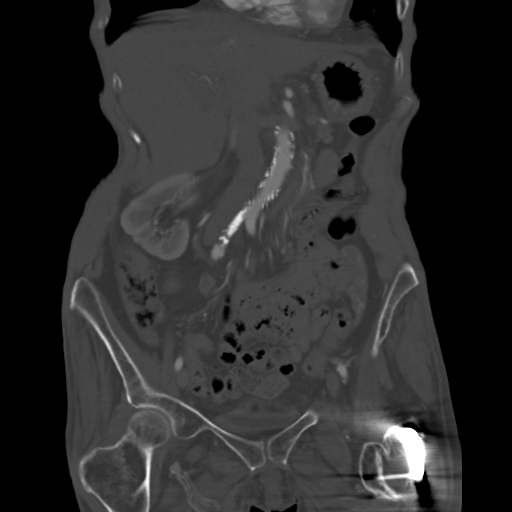
[im 61/91  soft-tissue]
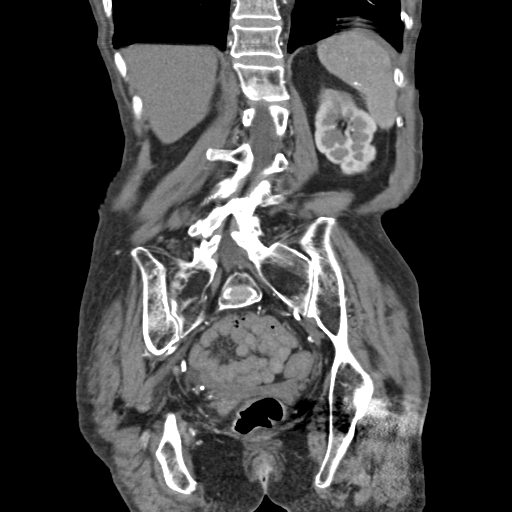

[12 of 46 positions shown; findings below may reference images not displayed]

FINDINGS: The lung bases are clear.

The liver demonstrates no focal abnormality. There is mild
periportal edema. No intrahepatic biliary ductal dilatation. Mild
common bile duct dilatation measuring up to 8 mm. The gallbladder is
normal. The spleen demonstrates no focal abnormality.There post
radiofrequency ablation changes in the inferior pole of the left
kidney which are unchanged compared with 02/25/2011. There are no
findings to suggest recurrent or residual malignancy. The right
kidney, adrenal glands and pancreas are normal. The bladder is
unremarkable.

Small hiatal hernia. The stomach, duodenum, small intestine, and
large intestine demonstrate no gross abnormality, but evaluation is
limited secondary to lack of enteric contrast. There is no
pneumoperitoneum, pneumatosis, or portal venous gas. There is a
small amount of pelvic free fluid. There is no lymphadenopathy.

The abdominal aorta is normal in caliber with atherosclerosis. The
celiac artery, mesenteric artery and inferior mesenteric arteries
are patent. The left renal artery is patent. There is
atherosclerotic plaque at the origin of the right renal artery which
is limited in evaluation secondary to motion artifact.

There are no lytic or sclerotic osseous lesions. Lumbar spine
spondylosis most significant at L5-S1. Mild broad-based disc bulges
throughout the lumbar spine most prominent at L5-S1. Left proximal
femoral intra medullary nail and cannulated femoral neck screw
transfixing a healed intertrochanteric fracture.

Review of the MIP images confirms the above findings.
IMPRESSION: 1. The celiac artery common superior mesenteric artery and inferior
mesenteric arteries are patent.
2. No findings to suggest ischemic bowel.
3. Post ablation changes in the inferior pole of the left kidney
without recurrent or residual neoplasm.

## 2016-01-07 ENCOUNTER — Encounter: Payer: Self-pay | Admitting: Interventional Radiology

## 2016-02-28 ENCOUNTER — Inpatient Hospital Stay (HOSPITAL_COMMUNITY)
Admission: EM | Admit: 2016-02-28 | Discharge: 2016-02-29 | DRG: 190 | Disposition: A | Discharge status: Home Health | Payer: Medicaid Other | Attending: Internal Medicine | Admitting: Internal Medicine

## 2016-02-28 ENCOUNTER — Inpatient Hospital Stay (HOSPITAL_COMMUNITY): Payer: Medicaid Other

## 2016-02-28 ENCOUNTER — Emergency Department (HOSPITAL_COMMUNITY): Payer: Medicaid Other

## 2016-02-28 ENCOUNTER — Encounter (HOSPITAL_COMMUNITY): Payer: Self-pay | Admitting: Emergency Medicine

## 2016-02-28 DIAGNOSIS — Z886 Allergy status to analgesic agent status: Secondary | ICD-10-CM | POA: Diagnosis not present

## 2016-02-28 DIAGNOSIS — G894 Chronic pain syndrome: Secondary | ICD-10-CM | POA: Diagnosis not present

## 2016-02-28 DIAGNOSIS — K861 Other chronic pancreatitis: Secondary | ICD-10-CM | POA: Diagnosis present

## 2016-02-28 DIAGNOSIS — G8929 Other chronic pain: Secondary | ICD-10-CM | POA: Diagnosis not present

## 2016-02-28 DIAGNOSIS — Z79899 Other long term (current) drug therapy: Secondary | ICD-10-CM

## 2016-02-28 DIAGNOSIS — Z811 Family history of alcohol abuse and dependence: Secondary | ICD-10-CM | POA: Diagnosis not present

## 2016-02-28 DIAGNOSIS — J441 Chronic obstructive pulmonary disease with (acute) exacerbation: Secondary | ICD-10-CM | POA: Diagnosis not present

## 2016-02-28 DIAGNOSIS — Z8249 Family history of ischemic heart disease and other diseases of the circulatory system: Secondary | ICD-10-CM

## 2016-02-28 DIAGNOSIS — K5903 Drug induced constipation: Secondary | ICD-10-CM | POA: Diagnosis present

## 2016-02-28 DIAGNOSIS — F411 Generalized anxiety disorder: Secondary | ICD-10-CM | POA: Diagnosis not present

## 2016-02-28 DIAGNOSIS — Z720 Tobacco use: Secondary | ICD-10-CM | POA: Diagnosis not present

## 2016-02-28 DIAGNOSIS — K860 Alcohol-induced chronic pancreatitis: Secondary | ICD-10-CM | POA: Diagnosis not present

## 2016-02-28 DIAGNOSIS — K859 Acute pancreatitis without necrosis or infection, unspecified: Secondary | ICD-10-CM | POA: Diagnosis present

## 2016-02-28 DIAGNOSIS — Z825 Family history of asthma and other chronic lower respiratory diseases: Secondary | ICD-10-CM | POA: Diagnosis not present

## 2016-02-28 DIAGNOSIS — Z85528 Personal history of other malignant neoplasm of kidney: Secondary | ICD-10-CM

## 2016-02-28 DIAGNOSIS — J209 Acute bronchitis, unspecified: Secondary | ICD-10-CM | POA: Diagnosis present

## 2016-02-28 DIAGNOSIS — F17210 Nicotine dependence, cigarettes, uncomplicated: Secondary | ICD-10-CM | POA: Diagnosis present

## 2016-02-28 DIAGNOSIS — T40605A Adverse effect of unspecified narcotics, initial encounter: Secondary | ICD-10-CM | POA: Diagnosis present

## 2016-02-28 DIAGNOSIS — Z7951 Long term (current) use of inhaled steroids: Secondary | ICD-10-CM

## 2016-02-28 DIAGNOSIS — E43 Unspecified severe protein-calorie malnutrition: Secondary | ICD-10-CM | POA: Diagnosis not present

## 2016-02-28 DIAGNOSIS — Z888 Allergy status to other drugs, medicaments and biological substances status: Secondary | ICD-10-CM | POA: Diagnosis not present

## 2016-02-28 DIAGNOSIS — J440 Chronic obstructive pulmonary disease with acute lower respiratory infection: Secondary | ICD-10-CM | POA: Diagnosis present

## 2016-02-28 DIAGNOSIS — R1013 Epigastric pain: Secondary | ICD-10-CM

## 2016-02-28 DIAGNOSIS — R634 Abnormal weight loss: Secondary | ICD-10-CM | POA: Diagnosis present

## 2016-02-28 DIAGNOSIS — K219 Gastro-esophageal reflux disease without esophagitis: Secondary | ICD-10-CM | POA: Diagnosis present

## 2016-02-28 DIAGNOSIS — Z8711 Personal history of peptic ulcer disease: Secondary | ICD-10-CM | POA: Diagnosis not present

## 2016-02-28 DIAGNOSIS — Z681 Body mass index (BMI) 19 or less, adult: Secondary | ICD-10-CM | POA: Diagnosis not present

## 2016-02-28 DIAGNOSIS — I10 Essential (primary) hypertension: Secondary | ICD-10-CM | POA: Diagnosis not present

## 2016-02-28 DIAGNOSIS — F1120 Opioid dependence, uncomplicated: Secondary | ICD-10-CM | POA: Diagnosis present

## 2016-02-28 DIAGNOSIS — J449 Chronic obstructive pulmonary disease, unspecified: Secondary | ICD-10-CM | POA: Insufficient documentation

## 2016-02-28 DIAGNOSIS — Z903 Acquired absence of stomach [part of]: Secondary | ICD-10-CM | POA: Diagnosis not present

## 2016-02-28 LAB — COMPREHENSIVE METABOLIC PANEL
ALK PHOS: 91 U/L (ref 38–126)
ALT: 14 U/L (ref 14–54)
ANION GAP: 11 (ref 5–15)
AST: 21 U/L (ref 15–41)
Albumin: 3.7 g/dL (ref 3.5–5.0)
BUN: 8 mg/dL (ref 6–20)
CALCIUM: 8.9 mg/dL (ref 8.9–10.3)
CO2: 23 mmol/L (ref 22–32)
Chloride: 100 mmol/L — ABNORMAL LOW (ref 101–111)
Creatinine, Ser: 0.68 mg/dL (ref 0.44–1.00)
GFR calc non Af Amer: >60 mL/min (ref >60)
Glucose, Bld: 103 mg/dL — ABNORMAL HIGH (ref 65–99)
Potassium: 4.4 mmol/L (ref 3.5–5.1)
SODIUM: 134 mmol/L — AB (ref 135–145)
TOTAL PROTEIN: 7.1 g/dL (ref 6.5–8.1)
Total Bilirubin: 0.3 mg/dL (ref 0.3–1.2)

## 2016-02-28 LAB — URINALYSIS, ROUTINE W REFLEX MICROSCOPIC
Bacteria, UA: NONE SEEN
Bilirubin Urine: NEGATIVE
GLUCOSE, UA: NEGATIVE mg/dL
KETONES UR: NEGATIVE mg/dL
NITRITE: NEGATIVE
PROTEIN: NEGATIVE mg/dL
Specific Gravity, Urine: 1.023 (ref 1.005–1.030)
pH: 6 (ref 5.0–8.0)

## 2016-02-28 LAB — CBC WITH DIFFERENTIAL/PLATELET
BASOS ABS: 0 10*3/uL (ref 0.0–0.1)
BASOS PCT: 0 %
EOS ABS: 0 10*3/uL (ref 0.0–0.7)
EOS PCT: 0 %
HCT: 38.5 % (ref 36.0–46.0)
Hemoglobin: 13.1 g/dL (ref 12.0–15.0)
Lymphocytes Relative: 8 %
Lymphs Abs: 0.9 10*3/uL (ref 0.7–4.0)
MCH: 28.8 pg (ref 26.0–34.0)
MCHC: 34 g/dL (ref 30.0–36.0)
MCV: 84.6 fL (ref 78.0–100.0)
Monocytes Absolute: 0.9 10*3/uL (ref 0.1–1.0)
Monocytes Relative: 8 %
Neutro Abs: 9.5 10*3/uL — ABNORMAL HIGH (ref 1.7–7.7)
Neutrophils Relative %: 84 %
PLATELETS: 225 10*3/uL (ref 150–400)
RBC: 4.55 MIL/uL (ref 3.87–5.11)
RDW: 14.1 % (ref 11.5–15.5)
WBC: 11.4 10*3/uL — AB (ref 4.0–10.5)

## 2016-02-28 LAB — I-STAT CG4 LACTIC ACID, ED: LACTIC ACID, VENOUS: 1.4 mmol/L (ref 0.5–1.9)

## 2016-02-28 LAB — LIPASE, BLOOD: Lipase: 21 U/L (ref 11–51)

## 2016-02-28 MED ORDER — ONDANSETRON HCL 4 MG PO TABS: 4.0000 mg | ORAL_TABLET | Freq: Three times a day (TID) | ORAL | Status: DC | PRN

## 2016-02-28 MED ORDER — ENSURE ENLIVE PO LIQD: 237.0000 mL | Freq: Two times a day (BID) | ORAL | Status: DC

## 2016-02-28 MED ORDER — LOSARTAN POTASSIUM 50 MG PO TABS
100.0000 mg | ORAL_TABLET | Freq: Every day | ORAL | Status: DC
  Administered 2016-02-28 – 2016-02-29 (×2): 100 mg via ORAL
  Filled 2016-02-28 (×2): qty 2

## 2016-02-28 MED ORDER — GI COCKTAIL ~~LOC~~
30.0000 mL | Freq: Once | ORAL | Status: DC
Start: 2016-02-28 — End: 2016-02-28
  Filled 2016-02-28: qty 30

## 2016-02-28 MED ORDER — LORAZEPAM 1 MG PO TABS
1.0000 mg | ORAL_TABLET | Freq: Four times a day (QID) | ORAL | Status: DC | PRN
  Administered 2016-02-28 – 2016-02-29 (×4): 1 mg via ORAL
  Filled 2016-02-28 (×4): qty 1

## 2016-02-28 MED ORDER — ONDANSETRON HCL 4 MG PO TABS
4.0000 mg | ORAL_TABLET | Freq: Four times a day (QID) | ORAL | Status: DC | PRN
  Administered 2016-02-28 – 2016-02-29 (×3): 4 mg via ORAL
  Filled 2016-02-28 (×3): qty 1

## 2016-02-28 MED ORDER — HYDROMORPHONE HCL 2 MG PO TABS
4.0000 mg | ORAL_TABLET | Freq: Four times a day (QID) | ORAL | Status: DC | PRN
  Administered 2016-02-28 – 2016-02-29 (×4): 4 mg via ORAL
  Filled 2016-02-28 (×4): qty 2

## 2016-02-28 MED ORDER — MOMETASONE FURO-FORMOTEROL FUM 200-5 MCG/ACT IN AERO
2.0000 | INHALATION_SPRAY | Freq: Two times a day (BID) | RESPIRATORY_TRACT | Status: DC
  Administered 2016-02-28 – 2016-02-29 (×2): 2 via RESPIRATORY_TRACT
  Filled 2016-02-28: qty 8.8

## 2016-02-28 MED ORDER — ALBUTEROL SULFATE (2.5 MG/3ML) 0.083% IN NEBU
5.0000 mg | INHALATION_SOLUTION | Freq: Once | RESPIRATORY_TRACT | Status: DC
  Filled 2016-02-28: qty 6

## 2016-02-28 MED ORDER — ENOXAPARIN SODIUM 40 MG/0.4ML ~~LOC~~ SOLN: 40.0000 mg | SUBCUTANEOUS | Status: DC

## 2016-02-28 MED ORDER — ONDANSETRON HCL 4 MG/2ML IJ SOLN: 4.0000 mg | Freq: Four times a day (QID) | INTRAMUSCULAR | Status: DC | PRN

## 2016-02-28 MED ORDER — LEVOFLOXACIN 750 MG PO TABS: 750.0000 mg | ORAL_TABLET | Freq: Every day | ORAL | Status: DC

## 2016-02-28 MED ORDER — ALBUTEROL SULFATE (2.5 MG/3ML) 0.083% IN NEBU
5.0000 mg | INHALATION_SOLUTION | Freq: Once | RESPIRATORY_TRACT | Status: AC
  Administered 2016-02-28: 5 mg via RESPIRATORY_TRACT

## 2016-02-28 MED ORDER — LEVOFLOXACIN 500 MG PO TABS
500.0000 mg | ORAL_TABLET | Freq: Once | ORAL | Status: AC
  Administered 2016-02-28: 500 mg via ORAL
  Filled 2016-02-28: qty 1

## 2016-02-28 MED ORDER — POLYETHYLENE GLYCOL 3350 17 G PO PACK: 17.0000 g | PACK | Freq: Every day | ORAL | Status: DC | PRN

## 2016-02-28 MED ORDER — ONDANSETRON HCL 4 MG/2ML IJ SOLN
4.0000 mg | Freq: Once | INTRAMUSCULAR | Status: AC
  Administered 2016-02-28: 4 mg via INTRAVENOUS
  Filled 2016-02-28: qty 2

## 2016-02-28 MED ORDER — ALBUTEROL SULFATE (2.5 MG/3ML) 0.083% IN NEBU
2.5000 mg | INHALATION_SOLUTION | RESPIRATORY_TRACT | Status: DC | PRN
  Administered 2016-02-28: 2.5 mg via RESPIRATORY_TRACT
  Filled 2016-02-28: qty 3

## 2016-02-28 MED ORDER — ACETAMINOPHEN 325 MG PO TABS: 650.0000 mg | ORAL_TABLET | Freq: Four times a day (QID) | ORAL | Status: DC | PRN

## 2016-02-28 MED ORDER — ENOXAPARIN SODIUM 30 MG/0.3ML ~~LOC~~ SOLN
20.0000 mg | SUBCUTANEOUS | Status: DC
  Administered 2016-02-28: 20 mg via SUBCUTANEOUS
  Filled 2016-02-28: qty 0.2
  Filled 2016-02-28 (×2): qty 0.3
  Filled 2016-02-28: qty 0.2

## 2016-02-28 MED ORDER — ORAL CARE MOUTH RINSE
15.0000 mL | Freq: Two times a day (BID) | OROMUCOSAL | Status: DC
  Administered 2016-02-28: 15 mL via OROMUCOSAL

## 2016-02-28 MED ORDER — HYDROMORPHONE HCL 1 MG/ML IJ SOLN
1.0000 mg | Freq: Once | INTRAMUSCULAR | Status: AC
  Administered 2016-02-28: 1 mg via INTRAVENOUS
  Filled 2016-02-28: qty 1

## 2016-02-28 MED ORDER — NICOTINE 21 MG/24HR TD PT24
21.0000 mg | MEDICATED_PATCH | Freq: Every day | TRANSDERMAL | Status: DC
  Administered 2016-02-28: 21 mg via TRANSDERMAL
  Filled 2016-02-28: qty 1

## 2016-02-28 MED ORDER — BENZONATATE 100 MG PO CAPS
200.0000 mg | ORAL_CAPSULE | Freq: Three times a day (TID) | ORAL | Status: DC | PRN
  Administered 2016-02-28: 200 mg via ORAL
  Filled 2016-02-28: qty 2

## 2016-02-28 MED ORDER — PREDNISONE 20 MG PO TABS: 40.0000 mg | ORAL_TABLET | Freq: Every day | ORAL | Status: DC

## 2016-02-28 MED ORDER — IOPAMIDOL (ISOVUE-300) INJECTION 61%
80.0000 mL | Freq: Once | INTRAVENOUS | Status: AC | PRN
  Administered 2016-02-28: 80 mL via INTRAVENOUS

## 2016-02-28 MED ORDER — DEXTROSE 5 % IV SOLN
100.0000 mg | Freq: Once | INTRAVENOUS | Status: AC
  Administered 2016-02-28: 100 mg via INTRAVENOUS
  Filled 2016-02-28: qty 100

## 2016-02-28 MED ORDER — PREDNISONE 20 MG PO TABS
60.0000 mg | ORAL_TABLET | Freq: Once | ORAL | Status: AC
  Administered 2016-02-28: 60 mg via ORAL
  Filled 2016-02-28: qty 3

## 2016-02-28 MED ORDER — SODIUM CHLORIDE 0.9 % IV SOLN: INTRAVENOUS | Status: DC

## 2016-02-28 MED ORDER — METHYLPREDNISOLONE SODIUM SUCC 40 MG IJ SOLR
40.0000 mg | Freq: Four times a day (QID) | INTRAMUSCULAR | Status: AC
  Administered 2016-02-28 – 2016-02-29 (×4): 40 mg via INTRAVENOUS
  Filled 2016-02-28 (×4): qty 1

## 2016-02-28 MED ORDER — TIOTROPIUM BROMIDE MONOHYDRATE 18 MCG IN CAPS: 18.0000 ug | ORAL_CAPSULE | Freq: Every day | RESPIRATORY_TRACT | Status: DC

## 2016-02-28 MED ORDER — GI COCKTAIL ~~LOC~~
30.0000 mL | Freq: Once | ORAL | Status: AC
  Administered 2016-02-28: 30 mL via ORAL
  Filled 2016-02-28: qty 30

## 2016-02-28 MED ORDER — IPRATROPIUM-ALBUTEROL 0.5-2.5 (3) MG/3ML IN SOLN
3.0000 mL | Freq: Once | RESPIRATORY_TRACT | Status: AC
  Administered 2016-02-28: 3 mL via RESPIRATORY_TRACT
  Filled 2016-02-28: qty 3

## 2016-02-28 MED ORDER — IOPAMIDOL (ISOVUE-300) INJECTION 61%
INTRAVENOUS | Status: AC
  Filled 2016-02-28: qty 100

## 2016-02-28 MED ORDER — IPRATROPIUM-ALBUTEROL 0.5-2.5 (3) MG/3ML IN SOLN
3.0000 mL | Freq: Four times a day (QID) | RESPIRATORY_TRACT | Status: DC
  Administered 2016-02-28 (×3): 3 mL via RESPIRATORY_TRACT
  Filled 2016-02-28 (×3): qty 3

## 2016-02-28 MED ORDER — LEVOFLOXACIN 250 MG PO TABS
250.0000 mg | ORAL_TABLET | Freq: Every day | ORAL | Status: DC
  Filled 2016-02-28: qty 1

## 2016-02-28 MED ORDER — IOPAMIDOL (ISOVUE-300) INJECTION 61%
15.0000 mL | Freq: Once | INTRAVENOUS | Status: AC | PRN
  Administered 2016-02-28: 15 mL via ORAL

## 2016-02-28 MED ORDER — ACETAMINOPHEN 650 MG RE SUPP: 650.0000 mg | Freq: Four times a day (QID) | RECTAL | Status: DC | PRN

## 2016-02-28 NOTE — ED Notes (Addendum)
Pt going to CT now prior to transfer to [place]

## 2016-02-28 NOTE — Progress Notes (Signed)
PHARMACY NOTE:  ANTIMICROBIAL RENAL DOSAGE ADJUSTMENT  Current antimicrobial regimen includes a mismatch between antimicrobial dosage and estimated renal function.  As per policy approved by the Pharmacy & Therapeutics and Medical Executive Committees, the antimicrobial dosage will be adjusted accordingly.  Current antimicrobial dosage:  Levofloxacin 750 mg PO daily  Indication: COPD exacerbation  Renal Function:  Estimated Creatinine Clearance: 38.4 mL/min (by C-G formula based on SCr of 0.68 mg/dL). '[]'$      On intermittent HD, scheduled: '[]'$      On CRRT    Antimicrobial dosage has been changed to:  Levofloxacin 500 mg PO x 1, then 250 mg PO daily  Additional comments:   Thank you for allowing pharmacy to be a part of this patient's care.  Luiz Ochoa, [facility] 02/28/2016 11:44 AM

## 2016-02-28 NOTE — H&P (Addendum)
History and Physical  [name] MPN:[id number] DOB: 1950/10/18 DOA: 02/28/2016  Referring physician: Varney Biles, ER physician  PCP: Ricke Hey, MD  Outpatient Specialists: Roselie Awkward, pulmonology Patient coming from: Home & is able to ambulate using a walker or cane  Chief Complaint: Shortness of breath and cough   HPI: [name] is a 66 y.o. 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 In addition, patient states that she started complaining of midepigastric pain over the last 3 days or she is not having nausea and vomiting. We will of her symptoms might appearing to improve, she decided to come to the emergency room on the morning of 2/24.  ED Course: Emergency room, patient received nebulizers plus steroids. Chest x-ray unremarkable for pneumonia. Patient was keeping oxygen levels just above 90%. With 2 L nasal cannula at 94%. White blood cell count minimally elevated at 11. Despite interventions, patient still quite dyspneic and wheezy and was felt best that she come in for further evaluation. Liver function tests normal including normal lipase. Hospitalists were called for further evaluation  Review of Systems: Patient seen in the emergency room . Pt complains of continued cough, wheezing and shortness of breath with any kind of exertion. Patient also complains of chronic pain in her legs and back and chills complaints of abdominal pain in the midepigastric area   Pt denies any headaches, vision changes, dysphagia, chest pain, palpitations, hematuria, dysuria, constipation, diarrhea, focal extremity numbness weakness or pain .  Review of systems are otherwise negative   Past Medical History:  Diagnosis Date  . Cancer ([place])    renal  ca  . COPD (chronic obstructive pulmonary disease) ([place])   . Pancreatitis   . Pancreatitis   . Seizures ([place])   . Substance abuse    Past Surgical History:  Procedure Laterality Date  . IR GENERIC HISTORICAL  05/08/2015   IR RADIOLOGIST EVAL & MGMT 05/08/2015 Aletta Edouard, MD [facility] INTERV RAD  . IR GENERIC HISTORICAL  01/08/2014   IR RADIOLOGIST EVAL & MGMT 01/08/2014 Aletta Edouard, MD [facility] INTERV RAD  . KIDNEY SURGERY     removed cancerous lesions  . PARTIAL GASTRECTOMY      Social History:  reports that she has been smoking Cigarettes.  She has a 30.00 pack-year smoking history. She has never used smokeless tobacco. She reports that she drinks alcohol. She reports that she does not use drugs. Patient does reportedly have a history of alcohol pancreatitis but she downplays her much she drinks to me. She says that she does not drink every day and when she does, she will only occasionally have a beer. Patient lives at home with her husband.   Allergies  Allergen Reactions  . Aspirin Other (See Comments)    bleeding  . Chlorpromazine Hcl Other (See Comments)    Muscle spasms    Family History  Problem Relation Age of Onset  . CAD Mother   . Alcoholism Father   . COPD Father       Prior to Admission medications   Medication Sig Start Date End Date Taking? Authorizing Provider  albuterol (PROVENTIL HFA;VENTOLIN HFA) 108 (90 Base) MCG/ACT inhaler Inhale 2 puffs into the lungs every 6 (six) hours as needed for wheezing or shortness of breath.  02/27/15  Yes Juanito Doom, MD  benzonatate (TESSALON) 200 MG capsule Take 1 capsule (200 mg total) by mouth 3 (three) times daily as needed for cough. 12/02/15  Yes Juanito Doom, MD  DULERA 200-5 MCG/ACT AERO INHALE 2 PUFFS INTO THE LUNGS TWICE DAILY 12/01/15  Yes Juanito Doom, MD  HYDROmorphone (DILAUDID) 4 MG tablet Take 1 tablet (4 mg total) by mouth every 6 (six) hours as needed for severe pain. 09/23/13  Yes Sherwood Gambler,  MD  LORazepam (ATIVAN) 1 MG tablet Take 1 tablet (1 mg total) by mouth every 6 (six) hours as needed. For anxiety. Patient taking differently: Take 1 mg by mouth every 6 (six) hours as needed for anxiety. For anxiety. 04/01/13  Yes Erline Hau, MD  losartan (COZAAR) 100 MG tablet Take 1 tablet (100 mg total) by mouth daily. 09/03/15  Yes Tammy S Parrett, NP  ondansetron (ZOFRAN) 4 MG tablet Take 4 mg by mouth 3 (three) times daily as needed for nausea or vomiting.  05/09/14  Yes Historical Provider, MD  SPIRIVA HANDIHALER 18 MCG inhalation capsule INHALE 1 CAPSULE VIA HANDIHALER ONCE DAILY 11/07/15  Yes Juanito Doom, MD    Physical Exam: BP 151/98   Pulse 103   Temp 97.7 F (36.5 C) (Oral)   Resp 20   Ht '5\' 4"'$  (1.626 m)   Wt 36.3 kg (80 lb)   SpO2 92%   BMI 13.73 kg/m   General:  Alert and oriented 3, no acute distress looks underweight,  Eyes:  sclera nonicteric, extraocular movements are intact  ENT:   normocephalic natruretic, mucous membranes are slightly dry Neck: Supple, no JVD   Cardiovascular: regular rate and rhythm, borderline tachycardia   Respiratory: bilateral expiratory wheeze   Abdomen: soft, nontender, nondistended, positive bowel sounds   Skin: no skin breaks, tears or lesions  Musculoskeletal: no clubbing or cyanosis or edema  Psychiatric: patient is appropriate, no evidence of psychoses  Neurologic: no focal deficits           Labs on Admission:  Basic Metabolic Panel:  Recent Labs Lab 02/28/16 0637  NA 134*  K 4.4  CL 100*  CO2 23  GLUCOSE 103*  BUN 8  CREATININE 0.68  CALCIUM 8.9   Liver Function Tests:  Recent Labs Lab 02/28/16 0637  AST 21  ALT 14  ALKPHOS 91  BILITOT 0.3  PROT 7.1  ALBUMIN 3.7    Recent Labs Lab 02/28/16 0637  LIPASE 21   No results for input(s): AMMONIA in the last 168 hours. CBC:  Recent Labs Lab 02/28/16 0637  WBC 11.4*  NEUTROABS 9.5*  HGB 13.1  HCT 38.5  MCV 84.6  PLT 225    Cardiac Enzymes: No results for input(s): CKTOTAL, CKMB, CKMBINDEX, TROPONINI in the last 168 hours.  BNP (last 3 results) No results for input(s): BNP in the last 8760 hours.  ProBNP (last 3 results) No results for input(s): PROBNP in the last 8760 hours.  CBG: No results for input(s): GLUCAP in the last 168 hours.  Radiological Exams on Admission: Dg Chest 2 View  Result Date: 02/28/2016 CLINICAL DATA:  Initial evaluation for acute shortness of breath. History of COPD. EXAM: CHEST  2 VIEW COMPARISON:  Prior radiograph from 09/03/2015. FINDINGS: The cardiac and mediastinal silhouettes are stable in size and contour, and remain within normal limits. The lungs are hyperinflated with severe attenuation of the pulmonary markings, compatible with emphysema. No airspace consolidation, pleural effusion,  or pulmonary edema is identified. There is no pneumothorax. No acute osseous abnormality identified. IMPRESSION: 1. No active cardiopulmonary disease. 2. Emphysema. Electronically Signed   By: Jeannine Boga M.D.   On: 02/28/2016 06:00    EKG: Independently reviewed. Sinus tachycardia, nonspecific S/T-wave abnormalities  Assessment/Plan Present on Admission: . PANCREATITIS, CHRONIC: Lipase normal. Patient does complain of midepigastric abdominal pain. We'll check CT scan . HTN (hypertension), malignant: Stable . COPD with acute exacerbation [facility]): Principal problem. We'll treat with steroids plus nebulizers plus antibiotics and oxygen. Patient looks to be maintaining oxygen saturations, so ambulate and check ambulatory pulse ox. Monitor overnight. . Protein-calorie malnutrition, severe ([place]): Nutrition consult. . Loss of weight . Anxiety state: Continue when necessary Ativan . Chronic pain: We'll continue home regimen only. Tobacco use: Counseled. Patient still continues to actively smoke despite her advanced COPD. Have provided nicotine patch.  Active Problems:   Anxiety state    PANCREATITIS, CHRONIC   COPD with acute exacerbation ([facility])   HTN (hypertension), malignant   Protein-calorie malnutrition, severe (H[facility])   Loss of weight   Chronic pain   DVT prophylaxis: Lovenox as chances of her having pancreatitis   seem to be slim  Code Status: full code as confirmed by patient   Family Communication: left message for husband   Disposition Plan: anticipate discharge tomorrow as long as she is not hypoxic and tolerating by mouth   Consults called: none   Admission status: because patient is expected to be discharged tomorrow, will place her under observation     Annita Brod MD [facility] Pager [phone]  If 7PM-7AM, please contact night-coverage [email or web address] Password [facility]  02/28/2016, 9:56 AM

## 2016-02-28 NOTE — ED Provider Notes (Signed)
[place] DEPT Provider Note   CSN: [id number] Arrival date & time: 02/28/16  8242     History   Chief Complaint Chief Complaint  Patient presents with  . Shortness of Breath    HPI [name] is a 66 y.o. female.  HPI   Pt comes in with cc of abd pain, dib, cough. Pt has hx of COPD.  Pt has been feeling ill for the last week, with cough and dib. Pt's cough is worse than usual and she has a yellow phlegm. Pt still smokes 1 ppd. Pt has been using more of her rescue inhalers lately. Pt has body aches as well. Pt denies fever.  Pt also has abd pain. She has hx of pancreatitis and stomach ulcers. Abd pain x 3 days, constant, non radiating. Pain is worse with food. No emesis today, but she has been nauseated. Pt does feel that the pain, which is stabbing in nature is similar to her pancreatitis. She denies alcohol use. No bloody stools.  Hx of renal CA - pt is in remission.  Pt has no hx of PE, DVT and denies any exogenous hormone (testosterone / estrogen) use, long distance travels or surgery in the past 6 weeks, active cancer, recent immobilization.   Past Medical History:  Diagnosis Date  . Cancer ([place])    renal ca  . COPD (chronic obstructive pulmonary disease) ([place])   . Pancreatitis   . Pancreatitis   . Seizures ([place])   . Substance abuse     Patient Active Problem List   Diagnosis Date Noted  . Cough 09/03/2015  . Loss of weight 09/03/2015  . Abdominal pain 01/03/2015  . Hyponatremia 01/03/2015  . Hypocalcemia 01/03/2015  . Underweight 01/03/2015  . COPD GOLD III with reversible component  07/17/2014  . COPD, moderate ([place]) 04/23/2014  . DTs (delirium tremens) ([place]) 06/02/2013  . Chronic alcoholic pancreatitis ([place]) 06/02/2013  . Lactic acidosis 06/02/2013  . Recurrent acute pancreatitis 03/28/2013  . Alcohol withdrawal ([place]) 03/28/2013  . Pancreatitis 03/09/2013  . Tobacco abuse 03/09/2013  . Alcohol withdrawal syndrome with perceptual disturbance  ([place]) 03/09/2013  . Acute alcoholic pancreatitis 35/36/1443  . Benzodiazepine withdrawal ([place]) 02/09/2013  . Protein-calorie malnutrition, severe ([place]) 02/09/2013  . Hypokalemia 02/27/2011  . Abdominal pain, acute 02/25/2011  . Nausea and vomiting 02/25/2011  . Thrombocytopenia ([place]) 02/25/2011  . COPD with acute exacerbation ([place]) 02/25/2011  . HTN (hypertension), malignant 02/25/2011  . KIDNEY CANCER 10/18/2008  . Anxiety state 10/18/2008  . GERD 10/18/2008  . CIRRHOSIS 10/18/2008  . PANCREATITIS, CHRONIC 10/18/2008  . DYSPNEA 10/18/2008  . GASTRIC ULCER, HX OF 10/18/2008    Past Surgical History:  Procedure Laterality Date  . IR GENERIC HISTORICAL  05/08/2015   IR RADIOLOGIST EVAL & MGMT 05/08/2015 Aletta Edouard, MD [facility] INTERV RAD  . IR GENERIC HISTORICAL  01/08/2014   IR RADIOLOGIST EVAL & MGMT 01/08/2014 Aletta Edouard, MD [facility] INTERV RAD  . KIDNEY SURGERY     removed cancerous lesions  . PARTIAL GASTRECTOMY      OB History    No data available       Home Medications    Prior to Admission medications   Medication Sig Start Date End Date Taking? Authorizing Provider  albuterol (PROVENTIL HFA;VENTOLIN HFA) 108 (90 Base) MCG/ACT inhaler Inhale 2 puffs into the lungs every 6 (six) hours as needed for wheezing or shortness of breath. 02/27/15  Yes Juanito Doom, MD  benzonatate (TESSALON) 200 MG capsule  Take 1 capsule (200 mg total) by mouth 3 (three) times daily as needed for cough. 12/02/15  Yes Juanito Doom, MD  DULERA 200-5 MCG/ACT AERO INHALE 2 PUFFS INTO THE LUNGS TWICE DAILY 12/01/15  Yes Juanito Doom, MD  HYDROmorphone (DILAUDID) 4 MG tablet Take 1 tablet (4 mg total) by mouth every 6 (six) hours as needed for severe pain. 09/23/13  Yes Sherwood Gambler, MD  LORazepam (ATIVAN) 1 MG tablet Take 1 tablet (1 mg total) by mouth every 6 (six) hours as needed. For anxiety. Patient taking differently: Take 1 mg by mouth every 6 (six) hours as needed for anxiety.  For anxiety. 04/01/13  Yes Erline Hau, MD  losartan (COZAAR) 100 MG tablet Take 1 tablet (100 mg total) by mouth daily. 09/03/15  Yes Tammy S Parrett, NP  ondansetron (ZOFRAN) 4 MG tablet Take 4 mg by mouth 3 (three) times daily as needed for nausea or vomiting.  05/09/14  Yes Historical Provider, MD  SPIRIVA HANDIHALER 18 MCG inhalation capsule INHALE 1 CAPSULE VIA HANDIHALER ONCE DAILY 11/07/15  Yes Juanito Doom, MD    Family History Family History  Problem Relation Age of Onset  . CAD Mother   . Alcoholism Father   . COPD Father     Social History Social History  Substance Use Topics  . Smoking status: Current Every Day Smoker    Packs/day: 1.00    Years: 30.00    Types: Cigarettes  . Smokeless tobacco: Never Used     Comment: smoking up to 1.5 ppd per husband.   . Alcohol use 0.0 oz/week     Allergies   Aspirin and Chlorpromazine hcl   Review of Systems Review of Systems  Respiratory: Positive for shortness of breath.   Cardiovascular: Positive for chest pain.  Gastrointestinal: Positive for abdominal pain and nausea.   ROS 10 Systems reviewed and are negative for acute change except as noted in the HPI.      Physical Exam Updated Vital Signs BP (!) 157/111   Pulse 110   Temp 97.7 F (36.5 C) (Oral)   Resp 23   Ht '5\' 4"'$  (1.626 m)   Wt 80 lb (36.3 kg)   SpO2 94%   BMI 13.73 kg/m   Physical Exam  Constitutional: She is oriented to person, place, and time. She appears well-developed and well-nourished.  HENT:  Head: Normocephalic and atraumatic.  Eyes: EOM are normal. Pupils are equal, round, and reactive to light.  Neck: Neck supple.  Cardiovascular: Normal rate, regular rhythm, normal heart sounds and intact distal pulses.   No murmur heard. Pulmonary/Chest: Effort normal. No respiratory distress.  Abdominal: Soft. She exhibits no distension. There is tenderness. There is no rebound and no guarding.  Neurological: She is alert and  oriented to person, place, and time.  Skin: Skin is warm and dry.  Nursing note and vitals reviewed.     ED Treatments / Results  Labs (all labs ordered are listed, but only abnormal results are displayed) Labs Reviewed  COMPREHENSIVE METABOLIC PANEL - Abnormal; Notable for the following:       Result Value   Sodium 134 (*)    Chloride 100 (*)    Glucose, Bld 103 (*)    All other components within normal limits  CBC WITH DIFFERENTIAL/PLATELET - Abnormal; Notable for the following:    WBC 11.4 (*)    Neutro Abs 9.5 (*)    All other components within normal limits  LIPASE, BLOOD  URINALYSIS, ROUTINE W REFLEX MICROSCOPIC  I-STAT CG4 LACTIC ACID, ED    EKG  EKG Interpretation  Date/Time:  Saturday February 28 2016 04:47:36 EST Ventricular Rate:  120 PR Interval:    QRS Duration: 115 QT Interval:  327 QTC Calculation: 462 R Axis:   86 Text Interpretation:  Sinus tachycardia peaked T waves v3-v6 Nonspecific ST and T wave abnormality last ekg is from 81 - this ekg is different Confirmed by Kathrynn Humble, MD, Thelma Comp [phone]) on 02/28/2016 4:57:29 AM       Radiology Dg Chest 2 View  Result Date: 02/28/2016 CLINICAL DATA:  Initial evaluation for acute shortness of breath. History of COPD. EXAM: CHEST  2 VIEW COMPARISON:  Prior radiograph from 09/03/2015. FINDINGS: The cardiac and mediastinal silhouettes are stable in size and contour, and remain within normal limits. The lungs are hyperinflated with severe attenuation of the pulmonary markings, compatible with emphysema. No airspace consolidation, pleural effusion, or pulmonary edema is identified. There is no pneumothorax. No acute osseous abnormality identified. IMPRESSION: 1. No active cardiopulmonary disease. 2. Emphysema. Electronically Signed   By: Jeannine Boga M.D.   On: 02/28/2016 06:00    Procedures Procedures (including critical care time)  Medications Ordered in ED Medications  albuterol (PROVENTIL) (2.5 MG/3ML)  0.083% nebulizer solution 5 mg (5 mg Nebulization Not Given 02/28/16 0515)  HYDROmorphone (DILAUDID) injection 1 mg (not administered)  gi cocktail (Maalox,Lidocaine,Donnatal) (not administered)  doxycycline (VIBRAMYCIN) 100 mg in dextrose 5 % 250 mL IVPB (not administered)  albuterol (PROVENTIL) (2.5 MG/3ML) 0.083% nebulizer solution 5 mg (5 mg Nebulization Given 02/28/16 0514)  ipratropium-albuterol (DUONEB) 0.5-2.5 (3) MG/3ML nebulizer solution 3 mL (3 mLs Nebulization Given 02/28/16 0706)  predniSONE (DELTASONE) tablet 60 mg (60 mg Oral Given 02/28/16 0640)  HYDROmorphone (DILAUDID) injection 1 mg (1 mg Intravenous Given 02/28/16 0638)  ondansetron (ZOFRAN) injection 4 mg (4 mg Intravenous Given 02/28/16 2297)     Initial Impression / Assessment and Plan / ED Course  I have reviewed the triage vital signs and the nursing notes.  Pertinent labs & imaging results that were available during my care of the patient were reviewed by me and considered in my medical decision making (see chart for details).  Clinical Course as of Feb 28 800  Sat Feb 28, 2016  0800 Lungs have cleared up. Pt is still tachycardic -120s. She is still having pain in her abd. Pt takes 4 mg po dilaudid. She only weighs 36 KG. We will give her 1 mg iv dilaudid and pepcid.  [AN]    Clinical Course User Index [AN] Varney Biles, MD   Pt comes in with cc of abd pain. Abd pain is epigastric, pt has hx of pancreatitis and her pain is similar. Pt also has extensive smoking hx and so PUD also possible. Pt has no back pain, pulse is equal bilateral upper extremity, there is no chest pain - no concerns for dissection. Pt's last 2 CT scans have been reviewed. Abd pain is likely PUD.Marland Kitchen   Final Clinical Impressions(s) / ED Diagnoses   Final diagnoses:  COPD exacerbation ([place])  Epigastric abdominal pain    New Prescriptions New Prescriptions   No medications on file     Varney Biles, MD 02/28/16 [phone]

## 2016-02-28 NOTE — Progress Notes (Signed)
Pt ambulated 40 feet. Pts O2 level was 99-100% on room air while ambulating.  Rosie Fate

## 2016-02-28 NOTE — ED Notes (Signed)
Attempted lab draw x 2 but unsuccessful. 

## 2016-02-28 NOTE — ED Triage Notes (Signed)
Patient complaining of mouth hurting due to all her teeth was pulled out. Patient also complains that her COPD is getting worse. She says she is coughing a lot, nauseated, abdomen is hurting. Patient states it started two weeks ago.

## 2016-02-29 DIAGNOSIS — E43 Unspecified severe protein-calorie malnutrition: Secondary | ICD-10-CM

## 2016-02-29 DIAGNOSIS — G8929 Other chronic pain: Secondary | ICD-10-CM

## 2016-02-29 DIAGNOSIS — F1120 Opioid dependence, uncomplicated: Secondary | ICD-10-CM

## 2016-02-29 DIAGNOSIS — J441 Chronic obstructive pulmonary disease with (acute) exacerbation: Principal | ICD-10-CM

## 2016-02-29 DIAGNOSIS — R1013 Epigastric pain: Secondary | ICD-10-CM

## 2016-02-29 DIAGNOSIS — K860 Alcohol-induced chronic pancreatitis: Secondary | ICD-10-CM

## 2016-02-29 DIAGNOSIS — Z720 Tobacco use: Secondary | ICD-10-CM

## 2016-02-29 DIAGNOSIS — F411 Generalized anxiety disorder: Secondary | ICD-10-CM

## 2016-02-29 MED ORDER — NICOTINE 7 MG/24HR TD PT24: 7.0000 mg | MEDICATED_PATCH | Freq: Every day | TRANSDERMAL | 0 refills | Status: DC

## 2016-02-29 MED ORDER — ALBUTEROL SULFATE (2.5 MG/3ML) 0.083% IN NEBU
INHALATION_SOLUTION | RESPIRATORY_TRACT | Status: AC
  Filled 2016-02-29: qty 3

## 2016-02-29 MED ORDER — NICOTINE 21 MG/24HR TD PT24: 21.0000 mg | MEDICATED_PATCH | Freq: Every day | TRANSDERMAL | 0 refills | Status: DC

## 2016-02-29 MED ORDER — IPRATROPIUM-ALBUTEROL 0.5-2.5 (3) MG/3ML IN SOLN
3.0000 mL | Freq: Three times a day (TID) | RESPIRATORY_TRACT | Status: DC
  Administered 2016-02-29: 3 mL via RESPIRATORY_TRACT
  Filled 2016-02-29: qty 3

## 2016-02-29 MED ORDER — ENSURE ENLIVE PO LIQD: 237.0000 mL | Freq: Three times a day (TID) | ORAL | 12 refills | Status: DC

## 2016-02-29 MED ORDER — BISACODYL 10 MG RE SUPP: 10.0000 mg | RECTAL | 0 refills | Status: DC | PRN

## 2016-02-29 MED ORDER — PANCRELIPASE (LIP-PROT-AMYL) 12000-38000 UNITS PO CPEP: 12000.0000 [IU] | ORAL_CAPSULE | Freq: Three times a day (TID) | ORAL | 0 refills | Status: DC

## 2016-02-29 MED ORDER — BENZONATATE 200 MG PO CAPS: 200.0000 mg | ORAL_CAPSULE | Freq: Three times a day (TID) | ORAL | 1 refills | Status: DC | PRN

## 2016-02-29 MED ORDER — IPRATROPIUM-ALBUTEROL 0.5-2.5 (3) MG/3ML IN SOLN: 3.0000 mL | Freq: Three times a day (TID) | RESPIRATORY_TRACT | 0 refills | Status: DC

## 2016-02-29 MED ORDER — IPRATROPIUM-ALBUTEROL 0.5-2.5 (3) MG/3ML IN SOLN: 3.0000 mL | Freq: Four times a day (QID) | RESPIRATORY_TRACT | 0 refills | Status: DC | PRN

## 2016-02-29 MED ORDER — PANCRELIPASE (LIP-PROT-AMYL) 12000-38000 UNITS PO CPEP: 12000.0000 [IU] | ORAL_CAPSULE | Freq: Three times a day (TID) | ORAL | Status: DC

## 2016-02-29 MED ORDER — POLYETHYLENE GLYCOL 3350 17 G PO PACK: 17.0000 g | PACK | Freq: Two times a day (BID) | ORAL | 0 refills | Status: DC

## 2016-02-29 MED ORDER — PREDNISONE 20 MG PO TABS: 40.0000 mg | ORAL_TABLET | Freq: Every day | ORAL | 0 refills | Status: DC

## 2016-02-29 MED ORDER — NICOTINE 14 MG/24HR TD PT24: 14.0000 mg | MEDICATED_PATCH | Freq: Every day | TRANSDERMAL | 0 refills | Status: DC

## 2016-02-29 MED ORDER — LEVOFLOXACIN 250 MG PO TABS: 250.0000 mg | ORAL_TABLET | Freq: Every day | ORAL | 0 refills | Status: DC

## 2016-02-29 MED ORDER — ALBUTEROL SULFATE (2.5 MG/3ML) 0.083% IN NEBU
2.5000 mg | INHALATION_SOLUTION | RESPIRATORY_TRACT | Status: DC | PRN
  Administered 2016-02-29: 2.5 mg via RESPIRATORY_TRACT

## 2016-02-29 NOTE — Care Management Note (Addendum)
Case Management Note  Patient Details  Name: [name] MRN: [id number] Date of Birth: [birth year]  Subjective/Objective:       Advanced COPD             Action/Plan: Discharge Planning: AVS reviewed: NCM spoke to pt and husband, Ronalee Belts at bedside. Pt needs neb machine for home. Husband states the will pick up at [employer] store on Monday. Provided pt with Rx and will fax to [facility] to process. Offered choice for [facility]. Husband selected [facility] for [facility] RN. Pt has electric wheelchair at home. Pt walking sat were 99% while ambulating per Unit RN note on 02/28/2016. Husband states pt was denied personal care assistant by [facility]. States the letter gives information on how to appeal. Explained to husband to follow up with PCP and request assistance from PCP on appeal. Pt had PCS services in the past and husband states they did not have a good reliable agency that provided care. Requested NCM fax dc summary to PCP. Contacted [facility] Liaison for [facility] for home.   PCP Ricke Hey MD   Expected Discharge Date:  02/29/16               Expected Discharge Plan:  [place]  In-House Referral:  NA  Discharge planning Services  CM Consult  Post Acute Care Choice:  Home Health Choice offered to:  Spouse, Patient  DME Arranged:  Nebulizer machine DME Agency:  [place]. (patient will pick up at retail store)  [place]:  RN [facility] Agency:  [place]  Status of Service:  Completed, signed off  If discussed at [place] of Stay Meetings, dates discussed:    Additional Comments:  Erenest Rasher, RN 02/29/2016, 10:42 AM

## 2016-02-29 NOTE — Discharge Summary (Signed)
Physician Discharge Summary  [name] KXF:[id number] DOB: [birth year] DOA: 02/28/2016  PCP: Ricke Hey, MD  Admit date: 02/28/2016 Discharge date: 02/29/2016  Admitted From: home  Disposition:  home   Recommendations for Outpatient Follow-up:  1. Wean narcotics and titrate up Creon for abdominal pain   Home Health:  RN Equipment/Devices:  Has motorized wheelchair already    Discharge Condition:  stable   CODE STATUS:  Full code   Diet recommendation:  Low sodium Consultations:      Discharge Diagnoses:  Principal Problem:   COPD with acute exacerbation ([place]) Active Problems:   Chronic alcoholic pancreatitis ([place])   Narcotic addiction ([place])   Abdominal pain, chronic, epigastric- due to chronic pancreatitis   Anxiety state   PANCREATITIS, CHRONIC- atrophic pancreas   HTN (hypertension), malignant   Protein-calorie malnutrition, severe (HCC)   Tobacco abuse    Subjective: Dyspnea and cough have improved. No wheezing. No longer needing O2.   Brief Summary: [name] is a 66 y.o. 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 In addition, patient states that she started complaining of midepigastric pain over the last 3 days or she is not having nausea and vomiting. We will of her symptoms might appearing to improve, she decided to come to the emergency room on the morning of 2/24.  Hospital Course:  COPD exacerbation/ acute bronchitis COPD Gold stage 4 per Dr [place] Bells - started on levaquin, Nebs and IV steroids in ER - will continue 5 days total of Levaquin, 4 more days of Prednisone and have ordered DME neb treatment for home  Nicotine abuse - smokes 1- 1.5 ppd - Nicotine patched prescribed- strictly advised patient to stop  smoking and husband to stop buying cigarettes for her- husband states he has quit smoking but she refuses to  Chronic pancreatitis due to ETOH abuse Chronic narcotic use Severe constipation due to narcotics - has chronic abdominal pain and is on 4 mg Dilaudid Q6 hrs at home- received 120 tabs every month - have started Creon for abdominal pain and explained to patient and husband the need for this-wean narcotics as able  - Miralax BID, Dulcolax suppository PRN  Severe protein calorie malnutrition Body mass index is 12.79 kg/m. - she has been this weight for at least a year (when looking back in records in [facility]) - added Ensure- see above in regards to abdominal pain  Anxiety -on PRN Ativan per PCP- not on SSRIs  HTN -   on Losartan  Discharge Instructions  Discharge Instructions    Diet general    Complete by:  As directed    Low fat (due to pancreatitis) Low sodium   Increase activity slowly    Complete by:  As directed      Allergies as of 02/29/2016      Reactions   Aspirin Other (See Comments)   bleeding   Chlorpromazine Hcl Other (See Comments)   Muscle spasms      Medication List    TAKE these medications   albuterol 108 (90 Base) MCG/ACT inhaler Commonly known as:  PROVENTIL HFA;VENTOLIN HFA Inhale 2 puffs into the lungs every 6 (six) hours as needed for wheezing or shortness of breath.   benzonatate 200 MG capsule Commonly known as:  TESSALON Take  1 capsule (200 mg total) by mouth 3 (three) times daily as needed for cough.   bisacodyl 10 MG suppository Commonly known as:  DULCOLAX Place 1 suppository (10 mg total) rectally as needed for moderate constipation.   DULERA 200-5 MCG/ACT Aero Generic drug:  mometasone-formoterol INHALE 2 PUFFS INTO THE LUNGS TWICE DAILY   feeding supplement (ENSURE ENLIVE) Liqd Take 237 mLs by mouth 3 (three) times daily between meals.   HYDROmorphone 4 MG tablet Commonly known as:  DILAUDID Take 1 tablet (4 mg total) by  mouth every 6 (six) hours as needed for severe pain.   ipratropium-albuterol 0.5-2.5 (3) MG/3ML Soln Commonly known as:  DUONEB Take 3 mLs by nebulization every 6 (six) hours as needed (dyspnea not releived by inhaler).   levofloxacin 250 MG tablet Commonly known as:  LEVAQUIN Take 1 tablet (250 mg total) by mouth daily.   lipase/protease/amylase 12000 units Cpep capsule Commonly known as:  CREON Take 1 capsule (12,000 Units total) by mouth 3 (three) times daily with meals.   LORazepam 1 MG tablet Commonly known as:  ATIVAN Take 1 tablet (1 mg total) by mouth every 6 (six) hours as needed. For anxiety. What changed:  reasons to take this  additional instructions   losartan 100 MG tablet Commonly known as:  COZAAR Take 1 tablet (100 mg total) by mouth daily.   nicotine 14 mg/24hr patch Commonly known as:  NICODERM CQ Place 1 patch (14 mg total) onto the skin daily. Start when 21 mg patches have finised   nicotine 21 mg/24hr patch Commonly known as:  NICODERM CQ Place 1 patch (21 mg total) onto the skin daily.   nicotine 7 mg/24hr patch Commonly known as:  NICODERM CQ Place 1 patch (7 mg total) onto the skin daily. Start after 14 mg patches are finished.   ondansetron 4 MG tablet Commonly known as:  ZOFRAN Take 4 mg by mouth 3 (three) times daily as needed for nausea or vomiting.   polyethylene glycol packet Commonly known as:  MIRALAX Take 17 g by mouth 2 (two) times daily.   predniSONE 20 MG tablet Commonly known as:  DELTASONE Take 2 tablets (40 mg total) by mouth daily with supper.   SPIRIVA HANDIHALER 18 MCG inhalation capsule Generic drug:  tiotropium INHALE 1 CAPSULE VIA HANDIHALER ONCE DAILY            Durable Medical Equipment        Start     Ordered   02/29/16 [phone]  For home use only DME Nebulizer machine  Once    Question:  Patient needs a nebulizer to treat with the following condition  Answer:  COPD, severe ([place])   02/29/16 [place] Follow up.   Why:  Pick up [employer] from [employer].  Contact information: [address] [address] [place] Follow up.   Why:  Home Health RN Contact information: [place] [address] [phone]          Allergies  Allergen Reactions  . Aspirin Other (See Comments)    bleeding  . Chlorpromazine Hcl Other (See Comments)    Muscle spasms     Procedures/Studies:    Dg Chest 2 View  Result Date: 02/28/2016 CLINICAL DATA:  Initial evaluation for acute shortness of breath. History of COPD.  EXAM: CHEST  2 VIEW COMPARISON:  Prior radiograph from 09/03/2015. FINDINGS: The cardiac and mediastinal silhouettes are stable in size and contour, and remain within normal limits. The lungs are hyperinflated with severe attenuation of the pulmonary markings, compatible with emphysema. No airspace consolidation, pleural effusion, or pulmonary edema is identified. There is no pneumothorax. No acute osseous abnormality identified. IMPRESSION: 1. No active cardiopulmonary disease. 2. Emphysema. Electronically Signed   By: Jeannine Boga M.D.   On: 02/28/2016 06:00   Ct Abdomen W Contrast  Result Date: 02/28/2016 CLINICAL DATA:  Cough, nausea, abdominal pain. EXAM: CT ABDOMEN AND PELVIS WITH CONTRAST TECHNIQUE: Multidetector CT imaging of the abdomen and pelvis was performed using the standard protocol following bolus administration of intravenous contrast. CONTRAST:  87m ISOVUE-300 IOPAMIDOL (ISOVUE-300) INJECTION 61% COMPARISON:  02/28/2016. FINDINGS: Lower chest: Lung bases show no acute findings. Heart size normal. No pericardial or pleural effusion. Hepatobiliary: The liver is unremarkable. Cholecystectomy. Extrahepatic bile duct measures 10 mm, unchanged. Pancreas: Atrophic. Spleen: Negative. Adrenals/Urinary Tract: Right adrenal gland is  unremarkable. A possibly bilobed low-attenuation lesion off the lower pole right kidney contains peripheral calcification and measures approximately 1.3 x 2.1 cm, stable. Ureters are decompressed. Bladder is grossly unremarkable. Stomach/Bowel: Stomach, small bowel, appendix and colon are unremarkable. Vascular/Lymphatic: Atherosclerotic calcification of the arterial vasculature without abdominal aortic aneurysm. No pathologically enlarged lymph nodes. Reproductive: Uterus appears to be absent.  No adnexal mass. Other:  No free fluid.  Mesenteries and peritoneum are unremarkable. Musculoskeletal: Postoperative changes in the left femur. No worrisome lytic or sclerotic lesions. Degenerative changes are seen in the spine. IMPRESSION: 1. No acute findings to explain the patient's nausea and abdominal pain. 2. Cryoablation defect along the lower pole left kidney is stable. 3.  Aortic atherosclerosis (ICD10-170.0). Electronically Signed   By: MLorin PicketM.D.   On: 02/28/2016 10:25       Discharge Exam: Vitals:   02/28/16 2140 02/29/16 0557  BP: 137/75 (!) 157/87  Pulse: (!) 106 (!) 105  Resp: 20 20  Temp: 98.3 F (36.8 C) 98.4 F (36.9 C)   Vitals:   02/28/16 2309 02/29/16 0358 02/29/16 0557 02/29/16 0836  BP:   (!) 157/87   Pulse:   (!) 105   Resp:   20   Temp:   98.4 F (36.9 C)   TempSrc:   Oral   SpO2: 99% 99% 96% 98%  Weight:      Height:        General: Pt is alert, awake, not in acute distress Cardiovascular: RRR, S1/S2 +, no rubs, no gallops Respiratory: CTA bilaterally, no wheezing, no rhonchi Abdominal: Soft, NT, ND, bowel sounds + Extremities: no edema, no cyanosis    The results of significant diagnostics from this hospitalization (including imaging, microbiology, ancillary and laboratory) are listed below for reference.     Microbiology: No results found for this or any previous visit (from the past 240 hour(s)).   Labs: BNP (last 3 results) No results for  input(s): BNP in the last 8760 hours. Basic Metabolic Panel:  Recent Labs Lab 02/28/16 0637  NA 134*  K 4.4  CL 100*  CO2 23  GLUCOSE 103*  BUN 8  CREATININE 0.68  CALCIUM 8.9   Liver Function Tests:  Recent Labs Lab 02/28/16 0637  AST 21  ALT 14  ALKPHOS 91  BILITOT 0.3  PROT 7.1  ALBUMIN 3.7    Recent Labs Lab 02/28/16 0637  LIPASE 21   No results  for input(s): AMMONIA in the last 168 hours. CBC:  Recent Labs Lab 02/28/16 0637  WBC 11.4*  NEUTROABS 9.5*  HGB 13.1  HCT 38.5  MCV 84.6  PLT 225   Cardiac Enzymes: No results for input(s): CKTOTAL, CKMB, CKMBINDEX, TROPONINI in the last 168 hours. BNP: Invalid input(s): POCBNP CBG: No results for input(s): GLUCAP in the last 168 hours. D-Dimer No results for input(s): DDIMER in the last 72 hours. Hgb A1c No results for input(s): HGBA1C in the last 72 hours. Lipid Profile No results for input(s): CHOL, HDL, LDLCALC, TRIG, CHOLHDL, LDLDIRECT in the last 72 hours. Thyroid function studies No results for input(s): TSH, T4TOTAL, T3FREE, THYROIDAB in the last 72 hours.  Invalid input(s): FREET3 Anemia work up No results for input(s): VITAMINB12, FOLATE, FERRITIN, TIBC, IRON, RETICCTPCT in the last 72 hours. Urinalysis    Component Value Date/Time   COLORURINE STRAW (A) 02/28/2016 1348   APPEARANCEUR CLEAR 02/28/2016 1348   LABSPEC 1.023 02/28/2016 1348   PHURINE 6.0 02/28/2016 1348   GLUCOSEU NEGATIVE 02/28/2016 1348   HGBUR SMALL (A) 02/28/2016 1348   BILIRUBINUR NEGATIVE 02/28/2016 1348   KETONESUR NEGATIVE 02/28/2016 1348   PROTEINUR NEGATIVE 02/28/2016 1348   UROBILINOGEN 0.2 08/14/2014 1308   NITRITE NEGATIVE 02/28/2016 1348   LEUKOCYTESUR TRACE (A) 02/28/2016 1348   Sepsis Labs Invalid input(s): PROCALCITONIN,  WBC,  LACTICIDVEN Microbiology No results found for this or any previous visit (from the past 240 hour(s)).   Time coordinating discharge: Over 30  minutes  SIGNED:   Debbe Odea, MD  [facility] 02/29/2016, 10:47 AM Pager   If 7PM-7AM, please contact night-coverage [email or web address] Password [email or web address]

## 2016-02-29 NOTE — Progress Notes (Signed)
Pt's vitals are WNL, tolerating diet and pain is under control. Discuss discharge instructions with both patient and husband. Discharged to home with prescriptions for medicine and nebulizer.

## 2016-02-29 NOTE — Progress Notes (Signed)
Per lab, patient is refusing labs to be drawn until 1300.

## 2016-04-19 ENCOUNTER — Emergency Department (HOSPITAL_COMMUNITY): Payer: Medicaid Other

## 2016-04-19 ENCOUNTER — Emergency Department (HOSPITAL_COMMUNITY)
Admission: EM | Admit: 2016-04-19 | Discharge: 2016-04-19 | Discharge status: Against Medical Advice | Payer: Medicaid Other | Attending: Emergency Medicine | Admitting: Emergency Medicine

## 2016-04-19 ENCOUNTER — Other Ambulatory Visit: Payer: Self-pay

## 2016-04-19 ENCOUNTER — Encounter (HOSPITAL_COMMUNITY): Payer: Self-pay | Admitting: Emergency Medicine

## 2016-04-19 DIAGNOSIS — R1084 Generalized abdominal pain: Secondary | ICD-10-CM | POA: Diagnosis present

## 2016-04-19 DIAGNOSIS — F1190 Opioid use, unspecified, uncomplicated: Secondary | ICD-10-CM | POA: Diagnosis not present

## 2016-04-19 DIAGNOSIS — Z79899 Other long term (current) drug therapy: Secondary | ICD-10-CM

## 2016-04-19 DIAGNOSIS — J449 Chronic obstructive pulmonary disease, unspecified: Secondary | ICD-10-CM | POA: Insufficient documentation

## 2016-04-19 DIAGNOSIS — R52 Pain, unspecified: Secondary | ICD-10-CM

## 2016-04-19 DIAGNOSIS — F17210 Nicotine dependence, cigarettes, uncomplicated: Secondary | ICD-10-CM | POA: Diagnosis not present

## 2016-04-19 DIAGNOSIS — I10 Essential (primary) hypertension: Secondary | ICD-10-CM | POA: Diagnosis not present

## 2016-04-19 LAB — COMPREHENSIVE METABOLIC PANEL
ALT: 16 U/L (ref 14–54)
ANION GAP: 8 (ref 5–15)
AST: 22 U/L (ref 15–41)
Albumin: 4 g/dL (ref 3.5–5.0)
Alkaline Phosphatase: 91 U/L (ref 38–126)
BILIRUBIN TOTAL: 0.7 mg/dL (ref 0.3–1.2)
BUN: 13 mg/dL (ref 6–20)
CO2: 22 mmol/L (ref 22–32)
CREATININE: 0.85 mg/dL (ref 0.44–1.00)
Calcium: 9.4 mg/dL (ref 8.9–10.3)
Chloride: 105 mmol/L (ref 101–111)
GFR calc non Af Amer: >60 mL/min (ref >60)
GLUCOSE: 85 mg/dL (ref 65–99)
Potassium: 4 mmol/L (ref 3.5–5.1)
SODIUM: 135 mmol/L (ref 135–145)
TOTAL PROTEIN: 7 g/dL (ref 6.5–8.1)

## 2016-04-19 LAB — CBC
HEMATOCRIT: 34.9 % — AB (ref 36.0–46.0)
Hemoglobin: 11.7 g/dL — ABNORMAL LOW (ref 12.0–15.0)
MCH: 27.9 pg (ref 26.0–34.0)
MCHC: 33.5 g/dL (ref 30.0–36.0)
MCV: 83.3 fL (ref 78.0–100.0)
PLATELETS: 220 10*3/uL (ref 150–400)
RBC: 4.19 MIL/uL (ref 3.87–5.11)
RDW: 15 % (ref 11.5–15.5)
WBC: 8.8 10*3/uL (ref 4.0–10.5)

## 2016-04-19 LAB — I-STAT TROPONIN, ED: Troponin i, poc: 0 ng/mL (ref 0.00–0.08)

## 2016-04-19 LAB — LIPASE, BLOOD: Lipase: 14 U/L (ref 11–51)

## 2016-04-19 NOTE — ED Notes (Signed)
ED Provider at bedside. 

## 2016-04-19 NOTE — ED Notes (Signed)
Patient continues to call out requesting pain medication. PA made aware patient is requesting injection.

## 2016-04-19 NOTE — ED Notes (Signed)
Patient ambulatory to nurses station, states "I am leaving." Patient refusing to sign AMA form. Ambulatory without difficulty, alert and oriented in NAD.

## 2016-04-19 NOTE — ED Notes (Signed)
Bed: WA21 Expected date:  Expected time:  Means of arrival:  Comments: EMS 66 yo abd pain
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
## 2016-04-19 NOTE — ED Triage Notes (Signed)
Per [facility], patient from home, c/o generalized pain x1 week. Reports she has been without her dilaudid x1 week and states her doctor will not give her any more. C/o abdominal pain and "hurting everywhere" since 3am. Ambulatory with EMS.   BP: 130/60 HR: 98 O2: 96% RR: 20 CBG: 103

## 2016-04-19 NOTE — ED Notes (Signed)
Patient transported to X-ray 

## 2016-04-19 NOTE — ED Notes (Signed)
Phlebotomy asked to attempt blood draw.

## 2016-04-19 NOTE — ED Notes (Signed)
Unsuccessful IV attempt.

## 2016-06-02 ENCOUNTER — Ambulatory Visit (INDEPENDENT_AMBULATORY_CARE_PROVIDER_SITE_OTHER): Payer: Medicaid Other | Admitting: Internal Medicine

## 2016-06-02 ENCOUNTER — Ambulatory Visit: Payer: Medicaid Other | Admitting: Pulmonary Disease

## 2016-06-02 DIAGNOSIS — S72001S Fracture of unspecified part of neck of right femur, sequela: Secondary | ICD-10-CM | POA: Diagnosis not present

## 2016-06-02 DIAGNOSIS — J449 Chronic obstructive pulmonary disease, unspecified: Secondary | ICD-10-CM

## 2016-06-02 DIAGNOSIS — S72001A Fracture of unspecified part of neck of right femur, initial encounter for closed fracture: Secondary | ICD-10-CM | POA: Insufficient documentation

## 2016-06-02 NOTE — Progress Notes (Signed)
Subjective:     Patient ID: [name], female   DOB: July 30, 1950, 66 y.o.   MRN: [id number]  PCP Ricke Hey, MD   HPI Synopsis: Gold grade 4 COPD PFT 07/17/2014 FEV1 0.95 ((36%)  Ratio 49 p 25% resp to saba  DLCO 49% Still smoking as of 09/02/2014    HPI Chief Complaint  Patient presents with  . Follow-up    pt states she is doing well, no new complaints today.    Denaisha never gave [place] any mucus after the last visit.  She says that she really isn't producing mucus any more.  She will have an occasion coughing spell, but not too bad or often.  Dyspnea: > about the same as the last visit > when she walks she gets short of breath > she says she can only walk a few feet or maybe for one minute before she has to stop  Cough: > improved with tessalon perles  COPD: > no flares since the last visit > Still taking Dulera and Spiriva, no problems with them  Tobacco abuse: > still smoking 1 pack per day > has cut back from 2 packs per day > she tried the nicotine patches but medicaid wouldn't pay for the nicotrol inhaler  OV 06/02/2016  Chief Complaint  Patient presents with  . Follow-up    f/u COPD, pt reports is the same, SOb with exertion, consistent cough   66 year old female with a advanced COPD on Spiriva and Dulera. Here for routine follow-up. She normally sees Dr. Lake Bells but due to scheduling issues at the front desk she is seeing Dr. Chase Caller for routine follow-up of advanced COPD. She tells me that from a COPD standpoint she is stable. Medication list shows she is on Spiriva and Dulera but she does not know what inhaler she is taking although she says she's taking it regularly. Nevertheless his COPD is not in exacerbation. She reports class II-III dyspnea on exertion which is baseline. The cough is only mild. However 3 weeks ago she was in [facility] troponin are more and fell down and fractured her right hip. She is a she was operated upon in [facility] and  is currently at home in [place] undergoing physical therapy. She denies any knowledge of having had osteoporosis. This a history of remote kidney cancer but she is unsure if she has bony metastasis    Results for BATSHEVA, STEVICK (MRN [id number]) as of 06/02/2016 12:39  Ref. Range 07/17/2014 08:27  FEV1-Post Latest Units: L 0.95  FEV1-%Pred-Post Latest Units: % 36  FEV1-%Change-Post Latest Units: % 25  Results for VYLET, MAFFIA (MRN [id number]) as of 06/02/2016 12:39  Ref. Range 07/17/2014 08:27  DLCO unc Latest Units: ml/min/mmHg 12.69  DLCO unc % pred Latest Units: % 49     has a past medical history of Cancer ([place]); COPD (chronic obstructive pulmonary disease) ([place]); Pancreatitis; Pancreatitis; Seizures ([place]); and Substance abuse.   reports that she has been smoking Cigarettes.  She has a 30.00 pack-year smoking history. She has never used smokeless tobacco.  Past Surgical History:  Procedure Laterality Date  . IR GENERIC HISTORICAL  05/08/2015   IR RADIOLOGIST EVAL & MGMT 05/08/2015 Aletta Edouard, MD [facility] INTERV RAD  . IR GENERIC HISTORICAL  01/08/2014   IR RADIOLOGIST EVAL & MGMT 01/08/2014 Aletta Edouard, MD [facility] INTERV RAD  . KIDNEY SURGERY     removed cancerous lesions  . PARTIAL GASTRECTOMY      Allergies  Allergen Reactions  . Aspirin Other (See Comments)    bleeding  . Chlorpromazine Hcl Other (See Comments)    Muscle spasms    Immunization History  Administered Date(s) Administered  . Influenza Split 10/22/2013, 10/27/2014, 08/05/2015  . Pneumococcal Conjugate-13 09/02/2014  . Pneumococcal Polysaccharide-23 02/11/2013    Family History  Problem Relation Age of Onset  . CAD Mother   . Alcoholism Father   . COPD Father      Current Outpatient Prescriptions:  .  albuterol (PROVENTIL HFA;VENTOLIN HFA) 108 (90 Base) MCG/ACT inhaler, Inhale 2 puffs into the lungs every 6 (six) hours as needed for wheezing or shortness of breath., Disp: 1 Inhaler, Rfl: 5 .   benzonatate (TESSALON) 200 MG capsule, Take 1 capsule (200 mg total) by mouth 3 (three) times daily as needed for cough., Disp: 30 capsule, Rfl: 1 .  bisacodyl (DULCOLAX) 10 MG suppository, Place 1 suppository (10 mg total) rectally as needed for moderate constipation., Disp: 12 suppository, Rfl: 0 .  DULERA 200-5 MCG/ACT AERO, INHALE 2 PUFFS INTO THE LUNGS TWICE DAILY, Disp: 39 g, Rfl: 3 .  feeding supplement, ENSURE ENLIVE, (ENSURE ENLIVE) LIQD, Take 237 mLs by mouth 3 (three) times daily between meals., Disp: 237 mL, Rfl: 12 .  HYDROmorphone (DILAUDID) 4 MG tablet, Take 1 tablet (4 mg total) by mouth every 6 (six) hours as needed for severe pain., Disp: 10 tablet, Rfl: 0 .  ipratropium-albuterol (DUONEB) 0.5-2.5 (3) MG/3ML SOLN, Take 3 mLs by nebulization every 6 (six) hours as needed (dyspnea not releived by inhaler)., Disp: 360 mL, Rfl: 0 .  levofloxacin (LEVAQUIN) 250 MG tablet, Take 1 tablet (250 mg total) by mouth daily., Disp: 4 tablet, Rfl: 0 .  lipase/protease/amylase (CREON) 12000 units CPEP capsule, Take 1 capsule (12,000 Units total) by mouth 3 (three) times daily with meals., Disp: 270 capsule, Rfl: 0 .  LORazepam (ATIVAN) 1 MG tablet, Take 1 tablet (1 mg total) by mouth every 6 (six) hours as needed. For anxiety. (Patient taking differently: Take 1 mg by mouth every 6 (six) hours as needed for anxiety. For anxiety.), Disp: 10 tablet, Rfl: 0 .  losartan (COZAAR) 100 MG tablet, Take 1 tablet (100 mg total) by mouth daily., Disp: 30 tablet, Rfl: 11 .  nicotine (NICODERM CQ) 14 mg/24hr patch, Place 1 patch (14 mg total) onto the skin daily. Start when 21 mg patches have finised, Disp: 7 patch, Rfl: 0 .  nicotine (NICODERM CQ) 21 mg/24hr patch, Place 1 patch (21 mg total) onto the skin daily., Disp: 7 patch, Rfl: 0 .  nicotine (NICODERM CQ) 7 mg/24hr patch, Place 1 patch (7 mg total) onto the skin daily. Start after 14 mg patches are finished., Disp: 14 patch, Rfl: 0 .  ondansetron (ZOFRAN) 4  MG tablet, Take 4 mg by mouth 3 (three) times daily as needed for nausea or vomiting. , Disp: , Rfl: 0 .  polyethylene glycol (MIRALAX) packet, Take 17 g by mouth 2 (two) times daily., Disp: 14 each, Rfl: 0 .  predniSONE (DELTASONE) 20 MG tablet, Take 2 tablets (40 mg total) by mouth daily with supper., Disp: 8 tablet, Rfl: 0 .  SPIRIVA HANDIHALER 18 MCG inhalation capsule, INHALE 1 CAPSULE VIA HANDIHALER ONCE DAILY, Disp: 30 capsule, Rfl: 5   Review of Systems     Objective:   Physical Exam Vitals:   06/02/16 1221  BP: (!) 96/54  Pulse: 74  SpO2: 97%    Estimated body mass index is 12.79  kg/m as calculated from the following:   Height as of 02/28/16: 5\' 4"  (1.626 m).   Weight as of 02/28/16: 74 lb 8 oz (33.8 kg).  Extremely cachectic frail female sitting on a wheelchair Significant neuromuscular weakness diffusely consistent with COPD and malnutrition Barrel chest without any wheeze no respiratory distress Mild purse lip breathing Normal heart sounds Soft abdomen No cyanosis no clubbing no edema Alert and oriented 3     Assessment:       ICD-9-CM ICD-10-CM   1. COPD GOLD III with reversible component  496 J44.9   2. Closed fracture of right hip, sequela 905.3 S72.001S         Plan:     COPD GOLD III with reversible component  Stable diseaes  Plan Continue spiriva and dulera scheduled as before Continue prednisone Respect refusal of alpha 1 genetic test  Followup 3-5 months with Dr Lake Bells for copd  Hip fracture, right [facility]) New issue after fall in [place] 2018  Plan  - talk to PCP Ricke Hey, MD about your bone health and evaluation fo osteoporosis or other reasons for fracture   Dr. Brand Males, M.D., [facility].C.P Pulmonary and Critical Care Medicine Staff Physician [facility] Pager: [phone], If no answer or between  15:00h - 7:00h: call [phone]  06/02/2016 12:49 PM

## 2016-06-02 NOTE — Patient Instructions (Signed)
COPD GOLD III with reversible component  Stable diseaes  Plan Continue spiriva and dulera scheduled as before Continue prednisone Respect refusal of alpha 1 genetic test  Followup 3-5 months with Dr Lake Bells for copd  Hip fracture, right [facility]) New issue after fall in [place] 2018  Plan  - talk to PCP Ricke Hey, MD about your bone health and evaluation fo osteoporosis or other reasons for fracture

## 2016-06-02 NOTE — Assessment & Plan Note (Signed)
New issue after fall in [place]  Plan  - talk to PCP Ricke Hey, MD about your bone health and evaluation fo osteoporosis or other reasons for fracture

## 2016-06-02 NOTE — Assessment & Plan Note (Signed)
Stable diseaes  Plan Continue spiriva and dulera scheduled as before Continue prednisone Respect refusal of alpha 1 genetic test  Followup 3-5 months with Dr Lake Bells for copd

## 2016-06-25 IMAGING — CT CT ABDOMEN WO/W CM
3 of 13 series · 12 of 46 positions shown, 18 images · IV contrast (OMNIPAQUE)
Comparison: 06/02/2013.

CLINICAL DATA: Left renal cell carcinoma. Status post cryo
ablation.

EXAM:
CT ABDOMEN WITHOUT AND WITH CONTRAST
TECHNIQUE: Multidetector CT imaging of the abdomen was performed following the
standard protocol before and following the bolus administration of
intravenous contrast.
CONTRAST:  100mL OMNIPAQUE IOHEXOL 300 MG/ML  SOLN

[Series 2: non contrast 3.0 b30f · axial · 0.64mm/px · z∈[+969,+1119]mm · 5 of 76 slices shown]
[im 13/76  soft-tissue]
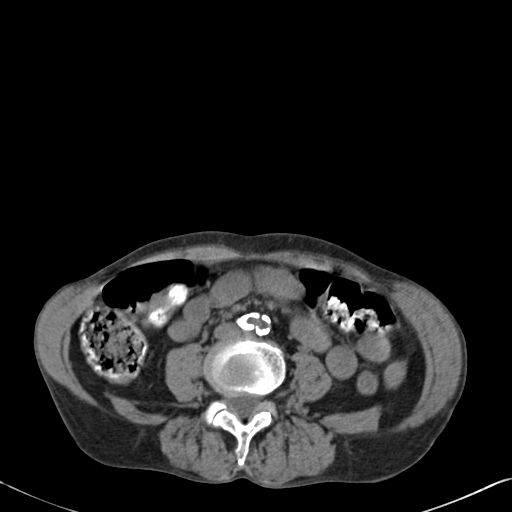
[im 26/76  soft-tissue]
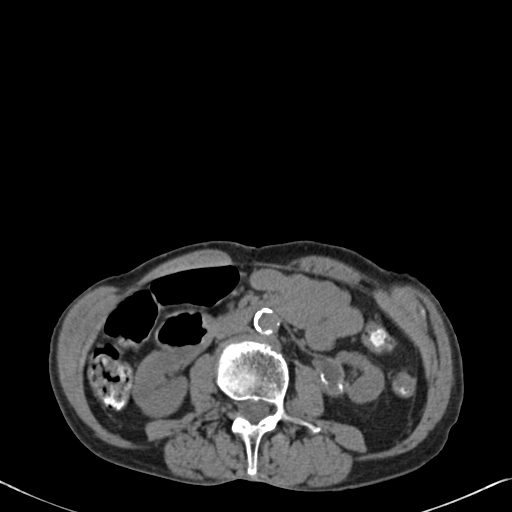
[im 38/76  soft-tissue]
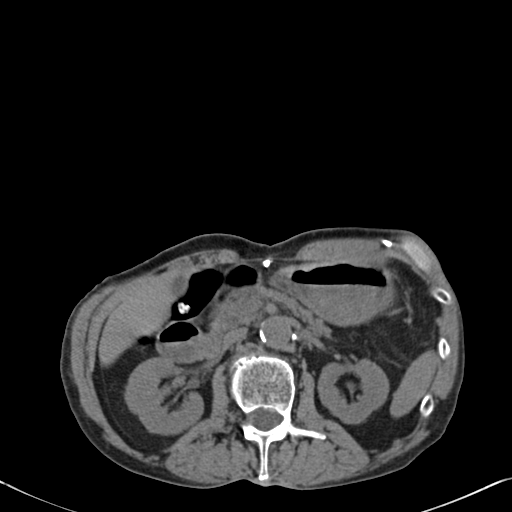
[im 51/76  soft-tissue]
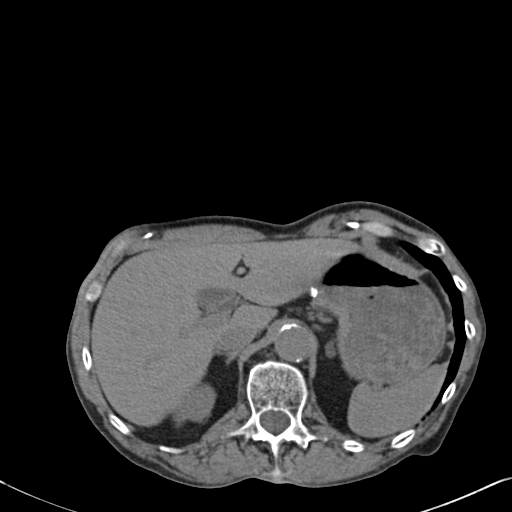
[im 63/76  soft-tissue]
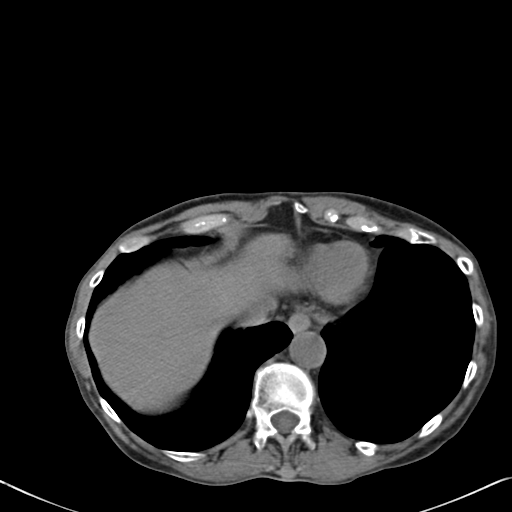

[Series 6: venous · axial · portal-venous · 0.65mm/px · z∈[+971,+1121]mm · 5 of 76 slices shown, 10 images]
[im 13/76  soft-tissue]
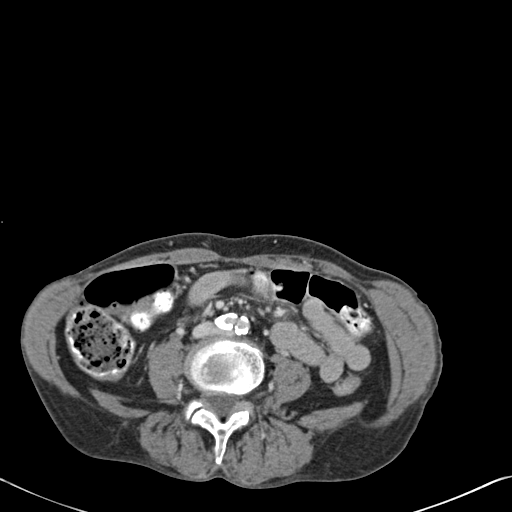
[im 13/76  bone]
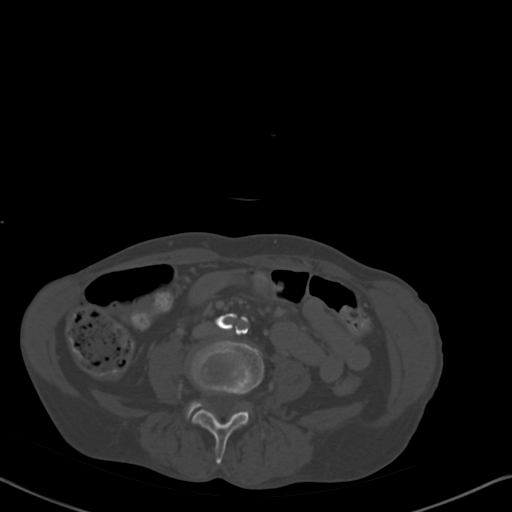
[im 26/76  soft-tissue]
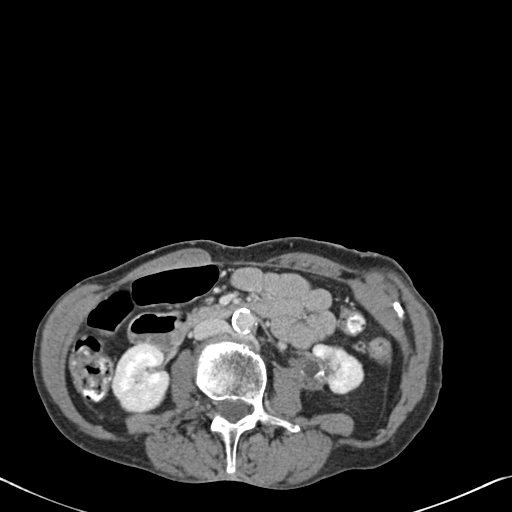
[im 26/76  lung]
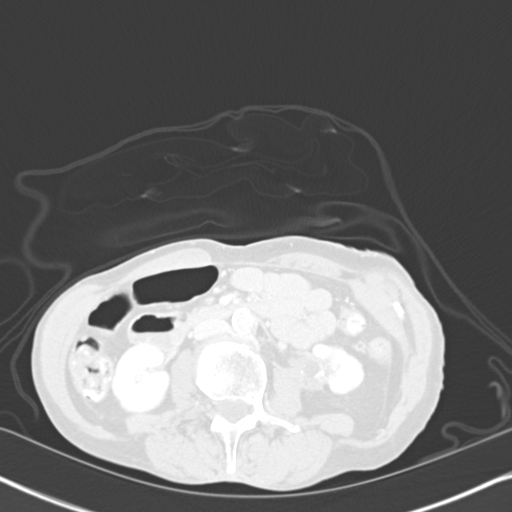
[im 38/76  soft-tissue]
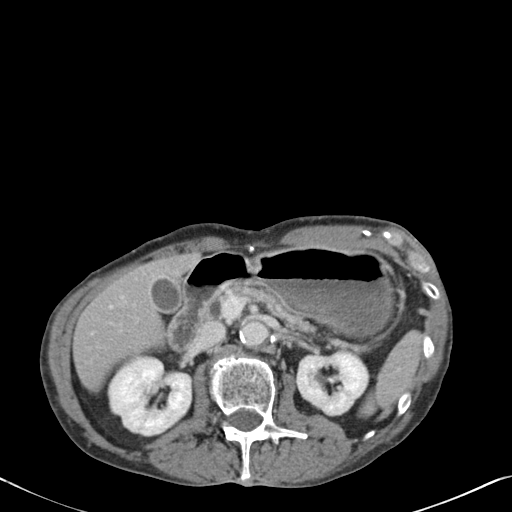
[im 38/76  lung]
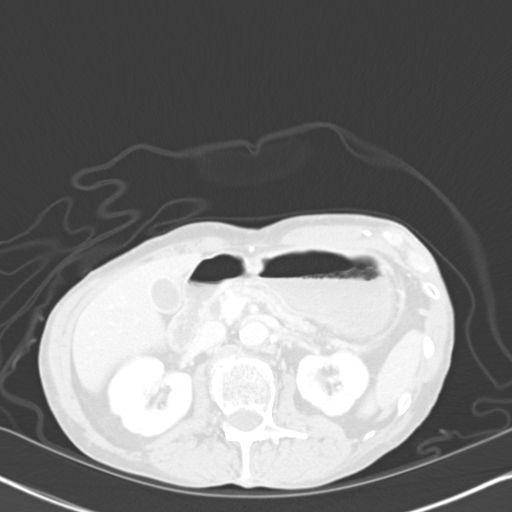
[im 51/76  soft-tissue]
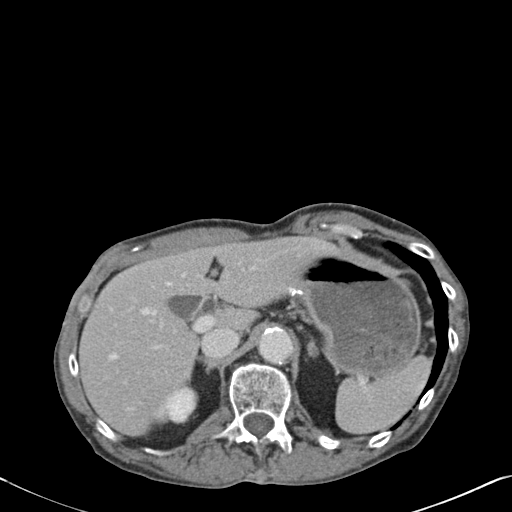
[im 51/76  lung]
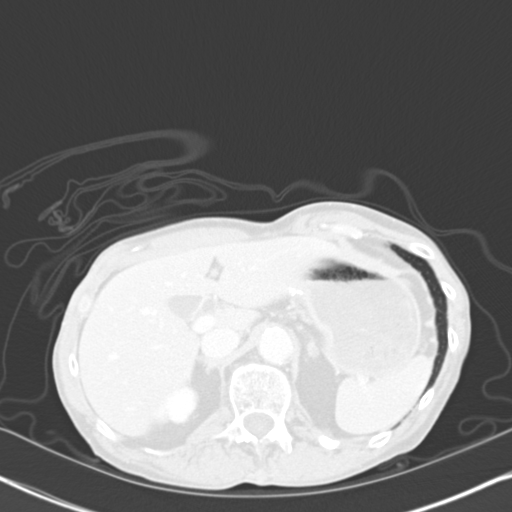
[im 63/76  soft-tissue]
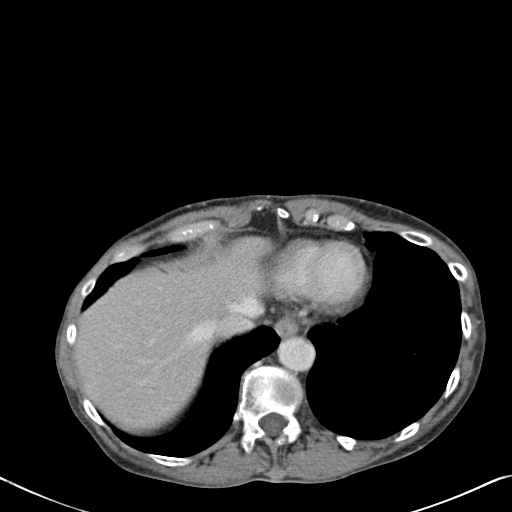
[im 63/76  lung]
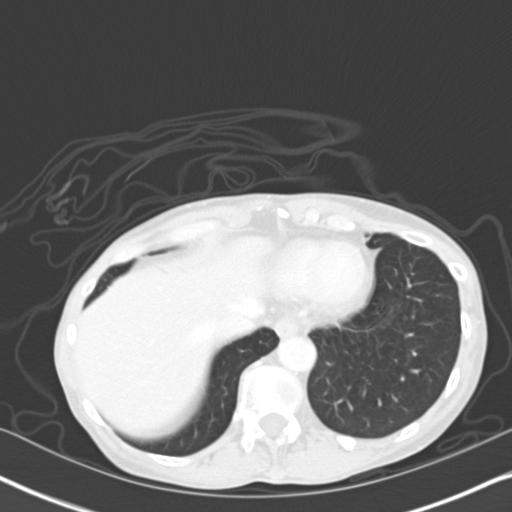

[Series 602: <mpr thick range> · coronal · 0.65mm/px · 2 of 99 slices shown, 3 images]
[im 33/99  soft-tissue]
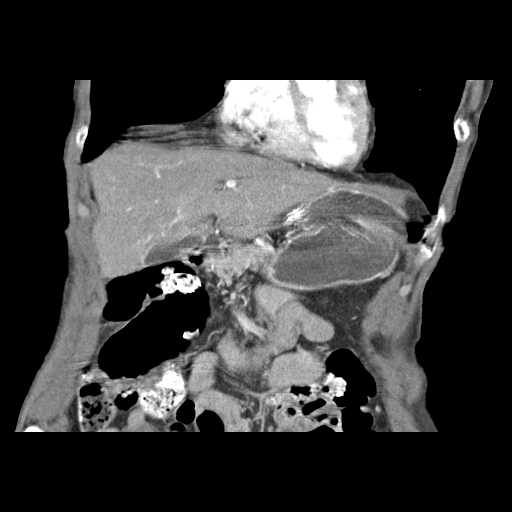
[im 33/99  bone]
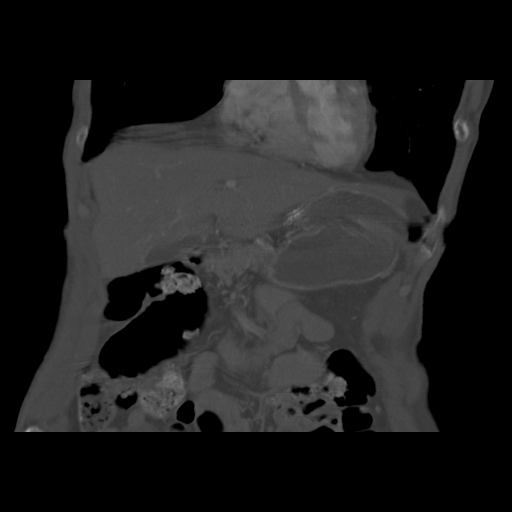
[im 66/99  soft-tissue]
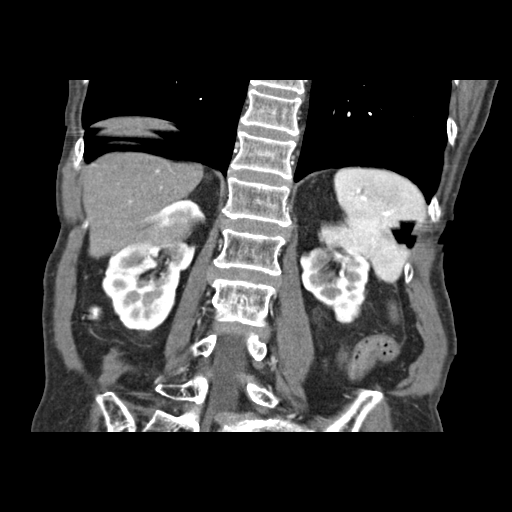

[12 of 46 positions shown; findings below may reference images not displayed]

FINDINGS: Lower chest: The lung bases are clear. No pleural effusion
identified.

Hepatobiliary: No focal liver abnormality. The gallbladder appears
normal. Increase caliber of the common bile duct measuring 8 mm,
image 47 of series 602.

Pancreas: Appears normal.

Spleen: Appears normal.

Adrenals/Urinary Tract: Normal adrenal glands. Normal appearance of
the right kidney. Cryoablation defect along the medial aspect of the
inferior pole of left kidney measures 1.9 x 2.6 cm, image 33/series
8. On the previous exam this mesh 2.1 x 2.8 cm. No significant
enhancement following IV contrast administration. No perinephric
fluid collections identified. The left renal artery and vein appear
patent.

Stomach/Bowel: The stomach is within normal limits. The visualized
small bowel loops have a normal course and caliber. No obstruction.
Normal appearance of the visualized portions of colon.

Vascular/Lymphatic: Calcified atherosclerotic disease involves the
abdominal aorta. No aneurysm. No enlarged retroperitoneal or
mesenteric adenopathy. No enlarged pelvic or inguinal lymph nodes.

Other: There is no ascites or focal fluid collections within the
abdomen or pelvis.

Musculoskeletal: Bones are osteopenic. There is scoliosis and
degenerative disc disease noted. No aggressive lytic or sclerotic
bone lesions identified.
IMPRESSION: 1. No acute findings identified.
2. Stable cryoablation site along the medial aspect of the inferior
pole of left kidney. No specific features identified to suggest
residual/recurrence of tumor.

## 2016-06-25 IMAGING — US IR FLUORO GUIDE CV LINE*R*
1 series · 1 of 1 positions shown · non-contrast
Comparison: none

CLINICAL DATA: Poor peripheral IV access and need for PICC line for
contrast administration for CT.

[Series 1: ir fluoro guide cv line*right* · 1 of 1 slices shown]
[im 1/1]
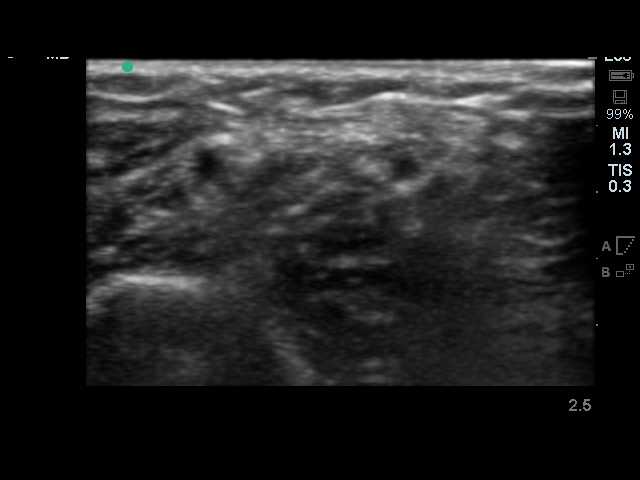

[1 of 1 positions shown; findings below may reference images not displayed]

EXAM:
POWER PICC LINE PLACEMENT WITH ULTRASOUND AND FLUOROSCOPIC GUIDANCE

FLUOROSCOPY TIME:  Dictate in minutes & seconds

PROCEDURE:
The patient was advised of the possible risks and complications and
agreed to undergo the procedure. The patient was then brought to the
angiographic suite for the procedure.

The left arm was prepped with chlorhexidine, draped in the usual
sterile fashion using maximum barrier technique (cap and mask,
sterile gown, sterile gloves, large sterile sheet, hand hygiene and
cutaneous antisepsis) and infiltrated locally with 1% Lidocaine. A
timeout was performed.

Ultrasound demonstrated patency of the left basilic vein, and this
was documented with an image. Under real-time ultrasound guidance,
this vein was accessed with a 21 gauge micropuncture needle and
image documentation was performed. A [DATE] wire was introduced in to
the vein. Over this, a 5.0 French single lumen power injectable PICC
was advanced to the lower SVC/right atrial junction. Fluoroscopy
during the procedure and fluoro spot radiograph confirms appropriate
catheter position. The catheter was flushed and covered with a
sterile dressing.

Catheter length: 39 cm

COMPLICATIONS:
None
IMPRESSION: Successful left arm power injectable PICC line placement with
ultrasound and fluoroscopic guidance. The catheter is ready for use.

## 2016-07-03 ENCOUNTER — Encounter (HOSPITAL_COMMUNITY): Payer: Self-pay | Admitting: *Deleted

## 2016-07-03 ENCOUNTER — Emergency Department (HOSPITAL_COMMUNITY)
Admission: EM | Admit: 2016-07-03 | Discharge: 2016-07-03 | Disposition: A | Discharge status: Home / Self Care | Payer: Medicaid Other | Attending: Emergency Medicine | Admitting: Emergency Medicine

## 2016-07-03 DIAGNOSIS — F17210 Nicotine dependence, cigarettes, uncomplicated: Secondary | ICD-10-CM | POA: Diagnosis not present

## 2016-07-03 DIAGNOSIS — J449 Chronic obstructive pulmonary disease, unspecified: Secondary | ICD-10-CM | POA: Insufficient documentation

## 2016-07-03 DIAGNOSIS — I10 Essential (primary) hypertension: Secondary | ICD-10-CM | POA: Diagnosis not present

## 2016-07-03 DIAGNOSIS — F1990 Other psychoactive substance use, unspecified, uncomplicated: Secondary | ICD-10-CM

## 2016-07-03 DIAGNOSIS — F1910 Other psychoactive substance abuse, uncomplicated: Secondary | ICD-10-CM | POA: Diagnosis not present

## 2016-07-03 DIAGNOSIS — F1120 Opioid dependence, uncomplicated: Secondary | ICD-10-CM | POA: Diagnosis not present

## 2016-07-03 DIAGNOSIS — Z85528 Personal history of other malignant neoplasm of kidney: Secondary | ICD-10-CM | POA: Insufficient documentation

## 2016-07-03 DIAGNOSIS — M25551 Pain in right hip: Secondary | ICD-10-CM | POA: Insufficient documentation

## 2016-07-03 DIAGNOSIS — Z79899 Other long term (current) drug therapy: Secondary | ICD-10-CM | POA: Diagnosis not present

## 2016-07-03 MED ORDER — HYDROMORPHONE HCL 1 MG/ML IJ SOLN
2.0000 mg | Freq: Once | INTRAMUSCULAR | Status: AC
  Administered 2016-07-03: 2 mg via INTRAMUSCULAR
  Filled 2016-07-03: qty 2

## 2016-07-03 MED ORDER — ONDANSETRON 4 MG PO TBDP
4.0000 mg | ORAL_TABLET | Freq: Once | ORAL | Status: AC
  Administered 2016-07-03: 4 mg via ORAL
  Filled 2016-07-03: qty 1

## 2016-07-03 MED ORDER — ONDANSETRON 4 MG PO TBDP: 4.0000 mg | ORAL_TABLET | Freq: Three times a day (TID) | ORAL | 0 refills | Status: DC | PRN

## 2016-07-03 NOTE — ED Triage Notes (Signed)
Pt had a Right hip Fracture in May 2018.  Pt was in rehab and became addicted to Dilaudid and has not been able to get a prescription for how much she wants.  She has been taking twice the prescribed amount of Dilaudid and is now out and has been for 3-4 days.

## 2016-07-03 NOTE — ED Notes (Signed)
Pt given phone to call her husband to pick her up.

## 2016-07-03 NOTE — Discharge Instructions (Signed)
Please read and follow all provided instructions.  Your diagnoses today include:  1. Pain of right hip joint   2. Uncomplicated opioid dependence ([place])   3. Misuse of prescription only drugs     Tests performed today include:  Vital signs. See below for your results today.   Medications prescribed:   Zofran (ondansetron) - for nausea and vomiting  Take any prescribed medications only as directed.  Home care instructions:   Follow any educational materials contained in this packet  Follow R.I.C.E. Protocol:  R - rest your injury   I  - use ice on injury without applying directly to skin  C - compress injury with bandage or splint  E - elevate the injury as much as possible  Follow-up instructions: Please follow-up with your primary care provider.  Return instructions:   Please return if your toes or feet are numb or tingling, appear gray or blue, or you have severe pain (also elevate the leg and loosen splint or wrap if you were given one)  Please return to the Emergency Department if you experience worsening symptoms.   Please return if you have any other emergent concerns.  Additional Information:  Your vital signs today were: BP (!) 161/122 (BP Location: Left Arm)    Pulse 74    Resp 20    SpO2 99%  If your blood pressure (BP) was elevated above 135/85 this visit, please have this repeated by your doctor within one month. --------------

## 2016-07-03 NOTE — ED Provider Notes (Signed)
[facility] DEPT Provider Note   CSN: [id number] Arrival date & time: 07/03/16  [place]     History   Chief Complaint No chief complaint on file.   HPI [name] is a 66 y.o. female.  Patient history of substance abuse issues, recent hip fracture 2 months ago on the right side -- presents with severe pain in her hip. Patient has been taking Dilaudid 4 mg tablets for chronic pain for a long time. This is prescribed by Dr. Alyson Ingles. Patient states that she ran out 4 days ago. She admits to taking more medications than she is prescribed. In place of this, she took Vicodin that were left over she had home; last dose this morning. She complains of pain in her hip and nausea. No fevers, vomiting, diarrhea. She is ambulatory at home with a walker. No new falls or injuries. The onset of this condition was acute. The course is constant. Aggravating factors: movement. Alleviating factors: none.    Last rx: 06/16/2016 hydromorphone 4mg  tab #120.       Past Medical History:  Diagnosis Date  . Cancer ([place])    renal ca  . COPD (chronic obstructive pulmonary disease) ([place])   . Pancreatitis   . Pancreatitis   . Seizures ([place])   . Substance abuse     Patient Active Problem List   Diagnosis Date Noted  . Hip fracture, right ([place]) 06/02/2016  . Narcotic addiction ([place]) 02/29/2016  . Abdominal pain, chronic, epigastric- due to chronic pancreatitis 02/29/2016  . COPD (chronic obstructive pulmonary disease) ([place]) 02/28/2016  . Cough 09/03/2015  . Abdominal pain 01/03/2015  . Hyponatremia 01/03/2015  . Hypocalcemia 01/03/2015  . Underweight 01/03/2015  . COPD GOLD III with reversible component  07/17/2014  . DTs (delirium tremens) ([place]) 06/02/2013  . Chronic alcoholic pancreatitis ([place]) 06/02/2013  . Lactic acidosis 06/02/2013  . Recurrent acute pancreatitis 03/28/2013  . Alcohol withdrawal ([place]) 03/28/2013  . Pancreatitis 03/09/2013  . Tobacco abuse 03/09/2013  . 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 Acute alcoholic pancreatitis 32/20/2542  . Benzodiazepine withdrawal ([place]) 02/09/2013  . Protein-calorie malnutrition, severe ([place]) 02/09/2013  . Hypokalemia 02/27/2011  . Abdominal pain, acute 02/25/2011  . Nausea and vomiting 02/25/2011  . Thrombocytopenia ([place]) 02/25/2011  . COPD with acute exacerbation ([place]) 02/25/2011  . HTN (hypertension), malignant 02/25/2011  . KIDNEY CANCER 10/18/2008  . Anxiety state 10/18/2008  . GERD 10/18/2008  . CIRRHOSIS 10/18/2008  . PANCREATITIS, CHRONIC- atrophic pancreas 10/18/2008  . DYSPNEA 10/18/2008  . GASTRIC ULCER, HX OF 10/18/2008    Past Surgical History:  Procedure Laterality Date  . IR GENERIC HISTORICAL  05/08/2015   IR RADIOLOGIST EVAL & MGMT 05/08/2015 Aletta Edouard, MD [facility] INTERV RAD  . IR GENERIC HISTORICAL  01/08/2014   IR RADIOLOGIST EVAL & MGMT 01/08/2014 Aletta Edouard, MD [facility] INTERV RAD  . KIDNEY SURGERY     removed cancerous lesions  . PARTIAL GASTRECTOMY      OB History    No data available       Home Medications    Prior to Admission medications   Medication Sig Start Date End Date Taking? Authorizing Provider  DULERA 200-5 MCG/ACT AERO INHALE 2 PUFFS INTO THE LUNGS TWICE DAILY 12/01/15  Yes Juanito Doom, MD  feeding supplement, ENSURE ENLIVE, (ENSURE ENLIVE) LIQD Take 237 mLs by mouth 3 (three) times daily between meals. 02/29/16  Yes Debbe Odea, MD  HYDROmorphone (DILAUDID) 2 MG tablet TK 1  T PO Q 6 H PRF POST OP PAIN 06/10/16  Yes [provider]  losartan (COZAAR) 100 MG tablet Take 1 tablet (100 mg total) by mouth daily. 09/03/15  Yes Parrett, Tammy S, NP  SPIRIVA HANDIHALER 18 MCG inhalation capsule INHALE 1 CAPSULE VIA HANDIHALER ONCE DAILY 11/07/15  Yes Juanito Doom, MD  tamsulosin (FLOMAX) 0.4 MG CAPS capsule TK 1 C PO QHS 06/10/16  Yes [provider]  XARELTO 10 MG TABS tablet TK 1 T PO QD 06/10/16  Yes [provider]  albuterol (PROVENTIL HFA;VENTOLIN HFA) 108 (90 Base) MCG/ACT inhaler Inhale 2 puffs into the lungs every 6 (six) hours as needed for wheezing or shortness of breath. 02/27/15   Juanito Doom, MD  benzonatate (TESSALON) 200 MG capsule Take 1 capsule (200 mg total) by mouth 3 (three) times daily as needed for cough. 02/29/16   Debbe Odea, MD  bisacodyl (DULCOLAX) 10 MG suppository Place 1 suppository (10 mg total) rectally as needed for moderate constipation. 02/29/16   Debbe Odea, MD  ipratropium-albuterol (DUONEB) 0.5-2.5 (3) MG/3ML SOLN Take 3 mLs by nebulization every 6 (six) hours as needed (dyspnea not releived by inhaler). 02/29/16   Debbe Odea, MD  LORazepam (ATIVAN) 1 MG tablet Take 1 tablet (1 mg total) by mouth every 6 (six) hours as needed. For anxiety. Patient taking differently: Take 1 mg by mouth every 6 (six) hours as needed for anxiety. For anxiety. 04/01/13   Isaac Bliss, Rayford Halsted, MD  ondansetron (ZOFRAN ODT) 4 MG disintegrating tablet Take 1 tablet (4 mg total) by mouth every 8 (eight) hours as needed for nausea or vomiting. 07/03/16   Carlisle Cater, PA-C  ondansetron (ZOFRAN) 4 MG tablet Take 4 mg by mouth 3 (three) times daily as needed for nausea or vomiting.  05/09/14   [provider]    Family History Family History  Problem Relation Age of Onset  . CAD Mother   . Alcoholism Father   . COPD Father     Social History Social History  Substance Use Topics  . Smoking status: Current Every Day Smoker    Packs/day: 1.00    Years: 30.00    Types: Cigarettes  . Smokeless tobacco: Never Used     Comment: smoking up to 1.5 ppd per husband.   . Alcohol use 0.0 oz/week     Allergies   Aspirin and Chlorpromazine hcl   Review of Systems Review of Systems  Constitutional: Negative for fever.  HENT: Negative for rhinorrhea and sore throat.   Eyes: Negative for redness.  Respiratory: Negative for cough.   Cardiovascular: Negative  for chest pain.  Gastrointestinal: Positive for nausea. Negative for abdominal pain, diarrhea and vomiting.  Genitourinary: Negative for dysuria.  Musculoskeletal: Positive for arthralgias. Negative for myalgias.  Skin: Negative for rash.  Neurological: Negative for headaches.     Physical Exam Updated Vital Signs BP (!) 161/122 (BP Location: Left Arm)   Pulse 74   Resp 20   SpO2 99%   Physical Exam  Constitutional: She appears well-developed. She appears cachectic. She appears distressed.  HENT:  Head: Normocephalic and atraumatic.  Mouth/Throat: Oropharynx is clear and moist.  Eyes: Conjunctivae are normal. Right eye exhibits no discharge. Left eye exhibits no discharge.  Neck: Normal range of motion. Neck supple.  Cardiovascular: Normal rate, regular rhythm and normal heart sounds.   Pulses:      Radial pulses are 2+ on the right side, and 2+ on  the left side.  Pulmonary/Chest: Effort normal and breath sounds normal.  Abdominal: Soft. There is no tenderness.  Musculoskeletal:       Right hip: She exhibits tenderness. She exhibits normal range of motion and normal strength.       Left hip: She exhibits normal range of motion, normal strength and no tenderness.       Right knee: Normal.       Left knee: Normal.       Lumbar back: Normal.  Neurological: She is alert.  Skin: Skin is warm and dry.  Psychiatric: She has a normal mood and affect.  Nursing note and vitals reviewed.    ED Treatments / Results  Labs (all labs ordered are listed, but only abnormal results are displayed) Labs Reviewed - No data to display  EKG  EKG Interpretation None       Radiology No results found.  Procedures Procedures (including critical care time)  Medications Ordered in ED Medications  HYDROmorphone (DILAUDID) injection 2 mg (2 mg Intramuscular Given 07/03/16 1949)  ondansetron (ZOFRAN-ODT) disintegrating tablet 4 mg (4 mg Oral Given 07/03/16 1950)     Initial Impression  / Assessment and Plan / ED Course  I have reviewed the triage vital signs and the nursing notes.  Pertinent labs & imaging results that were available during my care of the patient were reviewed by me and considered in my medical decision making (see chart for details).     Patient seen and examined. Exam is unrevealing. Possibly slight withdrawal. Do not suspect new injury, septic hip. Abd soft. Will give dose IM pain medication here. Discussed no meds for home, this needs to be reconciled with her PCP. Will d/c with zofran. No vomiting here. Discussed that she needs to use medications as prescribed due to risk of overdose, death.   Vital signs reviewed and are as follows: BP (!) 161/122 (BP Location: Left Arm)   Pulse 74   Resp 20   SpO2 99%     Final Clinical Impressions(s) / ED Diagnoses   Final diagnoses:  Pain of right hip joint  Uncomplicated opioid dependence (HCC)  Misuse of prescription only drugs     New Prescriptions New Prescriptions   ONDANSETRON (ZOFRAN ODT) 4 MG DISINTEGRATING TABLET    Take 1 tablet (4 mg total) by mouth every 8 (eight) hours as needed for nausea or vomiting.     Carlisle Cater, PA-C 07/03/16 Riley Lam, MD 07/06/16 1316

## 2016-08-09 ENCOUNTER — Encounter (HOSPITAL_COMMUNITY): Payer: Self-pay | Admitting: Emergency Medicine

## 2016-08-09 ENCOUNTER — Emergency Department (HOSPITAL_COMMUNITY)
Admission: EM | Admit: 2016-08-09 | Discharge: 2016-08-10 | Disposition: A | Discharge status: Home / Self Care | Payer: Medicaid Other | Attending: Emergency Medicine | Admitting: Emergency Medicine

## 2016-08-09 DIAGNOSIS — R109 Unspecified abdominal pain: Secondary | ICD-10-CM

## 2016-08-09 DIAGNOSIS — Z79899 Other long term (current) drug therapy: Secondary | ICD-10-CM | POA: Insufficient documentation

## 2016-08-09 DIAGNOSIS — J449 Chronic obstructive pulmonary disease, unspecified: Secondary | ICD-10-CM | POA: Insufficient documentation

## 2016-08-09 DIAGNOSIS — M25551 Pain in right hip: Secondary | ICD-10-CM | POA: Diagnosis not present

## 2016-08-09 DIAGNOSIS — R1013 Epigastric pain: Secondary | ICD-10-CM | POA: Diagnosis present

## 2016-08-09 DIAGNOSIS — Z85528 Personal history of other malignant neoplasm of kidney: Secondary | ICD-10-CM | POA: Diagnosis not present

## 2016-08-09 DIAGNOSIS — G8929 Other chronic pain: Secondary | ICD-10-CM | POA: Diagnosis not present

## 2016-08-09 DIAGNOSIS — E871 Hypo-osmolality and hyponatremia: Secondary | ICD-10-CM | POA: Insufficient documentation

## 2016-08-09 DIAGNOSIS — E876 Hypokalemia: Secondary | ICD-10-CM | POA: Diagnosis not present

## 2016-08-09 DIAGNOSIS — K861 Other chronic pancreatitis: Secondary | ICD-10-CM | POA: Diagnosis not present

## 2016-08-09 LAB — COMPREHENSIVE METABOLIC PANEL
ALBUMIN: 4 g/dL (ref 3.5–5.0)
ALT: 10 U/L — ABNORMAL LOW (ref 14–54)
ANION GAP: 9 (ref 5–15)
AST: 16 U/L (ref 15–41)
Alkaline Phosphatase: 106 U/L (ref 38–126)
BUN: 6 mg/dL (ref 6–20)
CO2: 23 mmol/L (ref 22–32)
Calcium: 9.1 mg/dL (ref 8.9–10.3)
Chloride: 96 mmol/L — ABNORMAL LOW (ref 101–111)
Creatinine, Ser: 0.88 mg/dL (ref 0.44–1.00)
GFR calc Af Amer: >60 mL/min (ref >60)
GFR calc non Af Amer: >60 mL/min (ref >60)
GLUCOSE: 103 mg/dL — AB (ref 65–99)
POTASSIUM: 3.2 mmol/L — AB (ref 3.5–5.1)
SODIUM: 128 mmol/L — AB (ref 135–145)
Total Bilirubin: 0.5 mg/dL (ref 0.3–1.2)
Total Protein: 7.2 g/dL (ref 6.5–8.1)

## 2016-08-09 LAB — CBC
HEMATOCRIT: 34.3 % — AB (ref 36.0–46.0)
HEMOGLOBIN: 12.3 g/dL (ref 12.0–15.0)
MCH: 30.4 pg (ref 26.0–34.0)
MCHC: 35.9 g/dL (ref 30.0–36.0)
MCV: 84.7 fL (ref 78.0–100.0)
Platelets: 278 10*3/uL (ref 150–400)
RBC: 4.05 MIL/uL (ref 3.87–5.11)
RDW: 13.7 % (ref 11.5–15.5)
WBC: 9 10*3/uL (ref 4.0–10.5)

## 2016-08-09 LAB — LIPASE, BLOOD: Lipase: 20 U/L (ref 11–51)

## 2016-08-09 MED ORDER — HYDROMORPHONE HCL 1 MG/ML IJ SOLN
2.0000 mg | Freq: Once | INTRAMUSCULAR | Status: AC
  Administered 2016-08-09: 2 mg via INTRAMUSCULAR
  Filled 2016-08-09: qty 2

## 2016-08-09 MED ORDER — POTASSIUM CHLORIDE CRYS ER 20 MEQ PO TBCR: 20.0000 meq | EXTENDED_RELEASE_TABLET | Freq: Two times a day (BID) | ORAL | 0 refills | Status: DC

## 2016-08-09 MED ORDER — ONDANSETRON 4 MG PO TBDP
4.0000 mg | ORAL_TABLET | Freq: Once | ORAL | Status: AC
  Administered 2016-08-09: 4 mg via ORAL
  Filled 2016-08-09: qty 1

## 2016-08-09 MED ORDER — LACTATED RINGERS IV BOLUS (SEPSIS)
1000.0000 mL | Freq: Once | INTRAVENOUS | Status: AC
  Administered 2016-08-10: 1000 mL via INTRAVENOUS

## 2016-08-09 MED ORDER — POTASSIUM CHLORIDE CRYS ER 20 MEQ PO TBCR
40.0000 meq | EXTENDED_RELEASE_TABLET | Freq: Once | ORAL | Status: DC
  Filled 2016-08-09: qty 2

## 2016-08-09 MED ORDER — ONDANSETRON 4 MG PO TBDP: 4.0000 mg | ORAL_TABLET | Freq: Three times a day (TID) | ORAL | 0 refills | Status: DC | PRN

## 2016-08-09 NOTE — ED Notes (Signed)
I attempted to collect labs nad was unsuccessful

## 2016-08-09 NOTE — ED Triage Notes (Addendum)
Per EMS, pt with hx chronic abdominal and hip pain c/o abdominal and hip pain onset 3 days ago after running out of her pain medications. Also c/o nausea and emesis, no diarrhea.

## 2016-08-09 NOTE — ED Notes (Signed)
IV attempt made by staff x2, unsuccessful

## 2016-08-09 NOTE — ED Provider Notes (Addendum)
[place] DEPT Provider Note   CSN: [id number] Arrival date & time: 08/09/16  1823     History   Chief Complaint Chief Complaint  Patient presents with  . Abdominal Pain    HPI [name] is a 66 y.o. female.  HPI   66 year old female with history of chronic pain, chronic pancreatitis, here with acute on chronic abdominal and hip pain. Regarding her abdominal pain, the pain feels similar to her previous episodes of acute on chronic pancreatitis. She states she has not been eating or drinking very much the last few days. She has felt generally fatigued. Denies any active vomiting. No diarrhea. The pain is an aching, cramping pain that is worse with eating. She also reports right hip pain that is chronic since her hip surgery last year. Denies any new falls. No new numbness or weakness.   Past Medical History:  Diagnosis Date  . Cancer ([place])    renal ca  . COPD (chronic obstructive pulmonary disease) ([place])   . Pancreatitis   . Pancreatitis   . Seizures ([place])   . Substance abuse     Patient Active Problem List   Diagnosis Date Noted  . Hip fracture, right ([place]) 06/02/2016  . Narcotic addiction ([place]) 02/29/2016  . Abdominal pain, chronic, epigastric- due to chronic pancreatitis 02/29/2016  . COPD (chronic obstructive pulmonary disease) ([place]) 02/28/2016  . Cough 09/03/2015  . Abdominal pain 01/03/2015  . Hyponatremia 01/03/2015  . Hypocalcemia 01/03/2015  . Underweight 01/03/2015  . COPD GOLD III with reversible component  07/17/2014  . DTs (delirium tremens) ([place]) 06/02/2013  . Chronic alcoholic pancreatitis ([place]) 06/02/2013  . Lactic acidosis 06/02/2013  . Recurrent acute pancreatitis 03/28/2013  . Alcohol withdrawal ([place]) 03/28/2013  . Pancreatitis 03/09/2013  . Tobacco abuse 03/09/2013  . Alcohol withdrawal syndrome with perceptual disturbance ([place]) 03/09/2013  . Acute alcoholic pancreatitis 09/73/5329  . Benzodiazepine withdrawal ([place]) 02/09/2013  .  Protein-calorie malnutrition, severe ([place]) 02/09/2013  . Hypokalemia 02/27/2011  . Abdominal pain, acute 02/25/2011  . Nausea and vomiting 02/25/2011  . Thrombocytopenia ([place]) 02/25/2011  . COPD with acute exacerbation ([place]) 02/25/2011  . HTN (hypertension), malignant 02/25/2011  . KIDNEY CANCER 10/18/2008  . Anxiety state 10/18/2008  . GERD 10/18/2008  . CIRRHOSIS 10/18/2008  . PANCREATITIS, CHRONIC- atrophic pancreas 10/18/2008  . DYSPNEA 10/18/2008  . GASTRIC ULCER, HX OF 10/18/2008    Past Surgical History:  Procedure Laterality Date  . IR GENERIC HISTORICAL  05/08/2015   IR RADIOLOGIST EVAL & MGMT 05/08/2015 Aletta Edouard, MD [facility] INTERV RAD  . IR GENERIC HISTORICAL  01/08/2014   IR RADIOLOGIST EVAL & MGMT 01/08/2014 Aletta Edouard, MD [facility] INTERV RAD  . KIDNEY SURGERY     removed cancerous lesions  . PARTIAL GASTRECTOMY      OB History    No data available       Home Medications    Prior to Admission medications   Medication Sig Start Date End Date Taking? Authorizing Provider  albuterol (PROVENTIL HFA;VENTOLIN HFA) 108 (90 Base) MCG/ACT inhaler Inhale 2 puffs into the lungs every 6 (six) hours as needed for wheezing or shortness of breath. 02/27/15  Yes Juanito Doom, MD  benzonatate (TESSALON) 200 MG capsule Take 1 capsule (200 mg total) by mouth 3 (three) times daily as needed for cough. 02/29/16  Yes Debbe Odea, MD  DULERA 200-5 MCG/ACT AERO INHALE 2 PUFFS INTO THE LUNGS TWICE DAILY Patient taking differently: INHALE 2 PUFFS INTO THE LUNGS TWICE  DAILY prn for wheezing 12/01/15  Yes Juanito Doom, MD  HYDROmorphone (DILAUDID) 2 MG tablet TK 1 Tabley by mouth every 6 Hours as needed for pain 06/10/16  Yes [provider]  ipratropium-albuterol (DUONEB) 0.5-2.5 (3) MG/3ML SOLN Take 3 mLs by nebulization every 6 (six) hours as needed (dyspnea not releived by inhaler). 02/29/16  Yes Debbe Odea, MD  LORazepam (ATIVAN) 1 MG tablet Take 1 tablet (1 mg  total) by mouth every 6 (six) hours as needed. For anxiety. Patient taking differently: Take 1 mg by mouth every 6 (six) hours as needed for anxiety. For anxiety. 04/01/13  Yes Isaac Bliss, Rayford Halsted, MD  losartan (COZAAR) 100 MG tablet Take 1 tablet (100 mg total) by mouth daily. 09/03/15  Yes Parrett, Tammy S, NP  ondansetron (ZOFRAN) 4 MG tablet Take 4 mg by mouth 3 (three) times daily as needed for nausea or vomiting.  05/09/14  Yes [provider]  rivaroxaban (XARELTO) 10 MG TABS tablet Take 10 mg by mouth daily.   Yes [provider]  SPIRIVA HANDIHALER 18 MCG inhalation capsule INHALE 1 CAPSULE VIA HANDIHALER ONCE DAILY 11/07/15  Yes Juanito Doom, MD  tamsulosin (FLOMAX) 0.4 MG CAPS capsule take 1 Capsule by mouth at bedtime 06/10/16  Yes [provider]  bisacodyl (DULCOLAX) 10 MG suppository Place 1 suppository (10 mg total) rectally as needed for moderate constipation. Patient not taking: Reported on 08/10/2016 02/29/16   Debbe Odea, MD  cephALEXin (KEFLEX) 500 MG capsule Take 1 capsule (500 mg total) by mouth 3 (three) times daily. 08/10/16   Rolland Porter, MD  feeding supplement, ENSURE ENLIVE, (ENSURE ENLIVE) LIQD Take 237 mLs by mouth 3 (three) times daily between meals. Patient not taking: Reported on 08/10/2016 02/29/16   Debbe Odea, MD  ondansetron (ZOFRAN ODT) 4 MG disintegrating tablet Take 1 tablet (4 mg total) by mouth every 8 (eight) hours as needed for nausea or vomiting. 08/10/16   Duffy Bruce, MD  potassium chloride (KLOR-CON) 20 MEQ packet Take 40 mEq by mouth 2 (two) times daily. 08/10/16 08/13/16  Duffy Bruce, MD    Family History Family History  Problem Relation Age of Onset  . CAD Mother   . Alcoholism Father   . COPD Father     Social History Social History  Substance Use Topics  . Smoking status: Current Every Day Smoker    Packs/day: 1.00    Years: 30.00    Types: Cigarettes  . Smokeless tobacco: Never Used     Comment:  smoking up to 1.5 ppd per husband.   . Alcohol use 0.0 oz/week     Allergies   Aspirin and Chlorpromazine hcl   Review of Systems Review of Systems  Constitutional: Positive for fatigue.  Gastrointestinal: Positive for abdominal pain and nausea.  Musculoskeletal: Positive for arthralgias.  Neurological: Positive for weakness.  All other systems reviewed and are negative.    Physical Exam Updated Vital Signs BP 110/82 (BP Location: Right Arm)   Pulse 82   Temp 98.5 F (36.9 C) (Oral)   Resp 16   SpO2 95%   Physical Exam  Constitutional: She is oriented to person, place, and time. She appears well-developed.  Cachectic, malnourished  HENT:  Head: Normocephalic and atraumatic.  Eyes: Conjunctivae are normal.  Neck: Neck supple.  Cardiovascular: Normal rate, regular rhythm and normal heart sounds.  Exam reveals no friction rub.   No murmur heard. Pulmonary/Chest: Effort normal and breath sounds normal. No respiratory  distress. She has no wheezes. She has no rales.  Abdominal: She exhibits no distension. There is tenderness in the epigastric area.  Musculoskeletal: She exhibits no edema.  TTP over right hip. No bruising or deformity.  Neurological: She is alert and oriented to person, place, and time. She exhibits normal muscle tone.  Skin: Skin is warm. Capillary refill takes less than 2 seconds.  Psychiatric: She has a normal mood and affect.  Nursing note and vitals reviewed.    ED Treatments / Results  Labs (all labs ordered are listed, but only abnormal results are displayed) Labs Reviewed  COMPREHENSIVE METABOLIC PANEL - Abnormal; Notable for the following:       Result Value   Sodium 128 (*)    Potassium 3.2 (*)    Chloride 96 (*)    Glucose, Bld 103 (*)    ALT 10 (*)    All other components within normal limits  CBC - Abnormal; Notable for the following:    HCT 34.3 (*)    All other components within normal limits  URINE CULTURE  LIPASE, BLOOD     EKG  EKG Interpretation None       Radiology No results found.  Procedures Procedures (including critical care time)  Medications Ordered in ED Medications  HYDROmorphone (DILAUDID) injection 2 mg (2 mg Intramuscular Given 08/09/16 2235)  ondansetron (ZOFRAN-ODT) disintegrating tablet 4 mg (4 mg Oral Given 08/09/16 2235)  lactated ringers bolus 1,000 mL (0 mLs Intravenous Stopped 08/10/16 0036)  fentaNYL (SUBLIMAZE) injection 50 mcg (50 mcg Intravenous Given 08/10/16 0034)  cephALEXin (KEFLEX) capsule 500 mg (500 mg Oral Given 08/10/16 0331)     Initial Impression / Assessment and Plan / ED Course  I have reviewed the triage vital signs and the nursing notes.  Pertinent labs & imaging results that were available during my care of the patient were reviewed by me and considered in my medical decision making (see chart for details).     66 yo F with PMHx chronic pain here with acute on chronic abdominal pain, hip pain. Regarding her hip pain, no new falls, no new weakness/numbness, do not feel repeat imaging indicated. Regarding her abd pain, this is also chronic. She does appear mildly dehydrated and lab work shows mild hypokalemia, acute on chronic hyponatremia likely 2/2 dehydration. Will give IVF here, have her f/u with PCP this week for repeat lab work and check-up. Will send home with zofran and potassium. Pt in agreement. Tolerating PO in ED here. Pt requested narcotic prescription but I discussed that due to her chronic pain and prescriptions, as well as h/o opioid misuse, we would not provide additional rx at this time. At time of shift end, sign out to Dr. Tomi Bamberger as pt is awaiting UA - plan to f/u UA, tx with Keflex if positive.  Final Clinical Impressions(s) / ED Diagnoses   Final diagnoses:  Chronic pain of right hip  Chronic abdominal pain  Hyponatremia  Hypokalemia    New Prescriptions Discharge Medication List as of 08/09/2016 11:52 PM    START taking these  medications   Details  potassium chloride SA (K-DUR,KLOR-CON) 20 MEQ tablet Take 1 tablet (20 mEq total) by mouth 2 (two) times daily., Starting Mon 08/09/2016, Until Thu 08/12/2016, Print         Duffy Bruce, MD 08/10/16 1102    Duffy Bruce, MD 08/10/16 [phone]

## 2016-08-09 NOTE — ED Notes (Signed)
Patient provided ginger ale.

## 2016-08-09 NOTE — ED Notes (Signed)
EDP at bedside to do USIV

## 2016-08-10 MED ORDER — CEPHALEXIN 500 MG PO CAPS
500.0000 mg | ORAL_CAPSULE | Freq: Once | ORAL | Status: AC
  Administered 2016-08-10: 500 mg via ORAL
  Filled 2016-08-10: qty 1

## 2016-08-10 MED ORDER — POTASSIUM CHLORIDE CRYS ER 20 MEQ PO TBCR
40.0000 meq | EXTENDED_RELEASE_TABLET | Freq: Two times a day (BID) | ORAL | Status: DC
  Administered 2016-08-10: 40 meq via ORAL
  Filled 2016-08-10: qty 2

## 2016-08-10 MED ORDER — FENTANYL CITRATE (PF) 100 MCG/2ML IJ SOLN
50.0000 ug | Freq: Once | INTRAMUSCULAR | Status: AC
  Administered 2016-08-10: 50 ug via INTRAVENOUS
  Filled 2016-08-10: qty 2

## 2016-08-10 MED ORDER — POTASSIUM CHLORIDE 20 MEQ PO PACK: 40.0000 meq | PACK | Freq: Two times a day (BID) | ORAL | 0 refills | Status: DC

## 2016-08-10 MED ORDER — ONDANSETRON 4 MG PO TBDP: 4.0000 mg | ORAL_TABLET | Freq: Three times a day (TID) | ORAL | 0 refills | Status: DC | PRN

## 2016-08-10 MED ORDER — CEPHALEXIN 500 MG PO CAPS: 500.0000 mg | ORAL_CAPSULE | Freq: Three times a day (TID) | ORAL | 0 refills | Status: DC

## 2016-08-10 NOTE — ED Notes (Signed)
This Probation officer witnessed patient throw away discharge papers, when this writer asked the patient if she wanted her discharge papers she stated she did not. When this writer asked the patient if she wanted her prescriptions she also did not.

## 2016-08-10 NOTE — ED Provider Notes (Signed)
Patient was left at the end of shift to get her urinalysis results. The nurse shuwed me her urine and it looked like pure pus. There was not enough to do a UA  so culture was sent. I reviewed patient's prior urine cultures and the last one was in 2013 and was mixed colonies with only 15,000 colonies per mL. Patient was started on Keflex. Patient's IV infiltrated however she was able to drink oral fluids well in the emergency department. At time of discharge she insisted to nursing staff that Dr. Ellender Hose had promised to write her pain medication to go home however he specifically told me he was not sending her home with prescription pain medication.   Rolland Porter, MD 08/10/16 [phone]

## 2016-08-11 LAB — URINE CULTURE: CULTURE: NO GROWTH

## 2016-09-11 ENCOUNTER — Encounter (HOSPITAL_COMMUNITY): Payer: Self-pay | Admitting: Emergency Medicine

## 2016-09-11 ENCOUNTER — Emergency Department (HOSPITAL_COMMUNITY)
Admission: EM | Admit: 2016-09-11 | Discharge: 2016-09-11 | Disposition: A | Discharge status: Home / Self Care | Payer: Medicaid Other | Attending: Emergency Medicine | Admitting: Emergency Medicine

## 2016-09-11 DIAGNOSIS — R1012 Left upper quadrant pain: Secondary | ICD-10-CM | POA: Diagnosis not present

## 2016-09-11 DIAGNOSIS — R197 Diarrhea, unspecified: Secondary | ICD-10-CM | POA: Diagnosis not present

## 2016-09-11 DIAGNOSIS — Z79899 Other long term (current) drug therapy: Secondary | ICD-10-CM | POA: Diagnosis not present

## 2016-09-11 DIAGNOSIS — R0602 Shortness of breath: Secondary | ICD-10-CM | POA: Insufficient documentation

## 2016-09-11 DIAGNOSIS — R112 Nausea with vomiting, unspecified: Secondary | ICD-10-CM | POA: Insufficient documentation

## 2016-09-11 DIAGNOSIS — J449 Chronic obstructive pulmonary disease, unspecified: Secondary | ICD-10-CM | POA: Diagnosis not present

## 2016-09-11 DIAGNOSIS — F17210 Nicotine dependence, cigarettes, uncomplicated: Secondary | ICD-10-CM | POA: Insufficient documentation

## 2016-09-11 DIAGNOSIS — Z85528 Personal history of other malignant neoplasm of kidney: Secondary | ICD-10-CM | POA: Diagnosis not present

## 2016-09-11 DIAGNOSIS — K861 Other chronic pancreatitis: Secondary | ICD-10-CM | POA: Diagnosis not present

## 2016-09-11 DIAGNOSIS — Z9981 Dependence on supplemental oxygen: Secondary | ICD-10-CM | POA: Diagnosis not present

## 2016-09-11 LAB — COMPREHENSIVE METABOLIC PANEL
ALBUMIN: 4.4 g/dL (ref 3.5–5.0)
ALT: 10 U/L — ABNORMAL LOW (ref 14–54)
AST: 16 U/L (ref 15–41)
Alkaline Phosphatase: 100 U/L (ref 38–126)
Anion gap: 10 (ref 5–15)
BUN: 7 mg/dL (ref 6–20)
CHLORIDE: 97 mmol/L — AB (ref 101–111)
CO2: 24 mmol/L (ref 22–32)
CREATININE: 0.7 mg/dL (ref 0.44–1.00)
Calcium: 9.8 mg/dL (ref 8.9–10.3)
GFR calc non Af Amer: >60 mL/min (ref >60)
Glucose, Bld: 105 mg/dL — ABNORMAL HIGH (ref 65–99)
POTASSIUM: 4.1 mmol/L (ref 3.5–5.1)
Sodium: 131 mmol/L — ABNORMAL LOW (ref 135–145)
Total Bilirubin: 0.8 mg/dL (ref 0.3–1.2)
Total Protein: 8.1 g/dL (ref 6.5–8.1)

## 2016-09-11 LAB — CBC
HEMATOCRIT: 37 % (ref 36.0–46.0)
Hemoglobin: 12.9 g/dL (ref 12.0–15.0)
MCH: 31 pg (ref 26.0–34.0)
MCHC: 34.9 g/dL (ref 30.0–36.0)
MCV: 88.9 fL (ref 78.0–100.0)
Platelets: 274 10*3/uL (ref 150–400)
RBC: 4.16 MIL/uL (ref 3.87–5.11)
RDW: 13.9 % (ref 11.5–15.5)
WBC: 11.3 10*3/uL — ABNORMAL HIGH (ref 4.0–10.5)

## 2016-09-11 LAB — URINALYSIS, ROUTINE W REFLEX MICROSCOPIC
BACTERIA UA: NONE SEEN
Bilirubin Urine: NEGATIVE
GLUCOSE, UA: NEGATIVE mg/dL
Ketones, ur: NEGATIVE mg/dL
LEUKOCYTES UA: NEGATIVE
Nitrite: NEGATIVE
PH: 7 (ref 5.0–8.0)
Protein, ur: NEGATIVE mg/dL
SPECIFIC GRAVITY, URINE: 1.005 (ref 1.005–1.030)
SQUAMOUS EPITHELIAL / LPF: NONE SEEN

## 2016-09-11 LAB — LIPASE, BLOOD: Lipase: 20 U/L (ref 11–51)

## 2016-09-11 MED ORDER — HYDROMORPHONE HCL 1 MG/ML IJ SOLN: 1.0000 mg | Freq: Once | INTRAMUSCULAR | Status: DC

## 2016-09-11 MED ORDER — SODIUM CHLORIDE 0.9 % IV BOLUS (SEPSIS)
1000.0000 mL | Freq: Once | INTRAVENOUS | Status: AC
  Administered 2016-09-11: 1000 mL via INTRAVENOUS

## 2016-09-11 MED ORDER — ONDANSETRON HCL 4 MG/2ML IJ SOLN
4.0000 mg | Freq: Once | INTRAMUSCULAR | Status: AC
  Administered 2016-09-11: 4 mg via INTRAVENOUS
  Filled 2016-09-11: qty 2

## 2016-09-11 MED ORDER — FENTANYL CITRATE (PF) 100 MCG/2ML IJ SOLN
50.0000 ug | Freq: Once | INTRAMUSCULAR | Status: AC
  Administered 2016-09-11: 50 ug via INTRAVENOUS
  Filled 2016-09-11: qty 2

## 2016-09-11 MED ORDER — HYDROMORPHONE HCL 1 MG/ML IJ SOLN
1.0000 mg | Freq: Once | INTRAMUSCULAR | Status: AC
Start: 2016-09-11 — End: 2016-09-11
  Administered 2016-09-11: 1 mg via INTRAVENOUS
  Filled 2016-09-11: qty 1

## 2016-09-11 MED ORDER — METOCLOPRAMIDE HCL 5 MG/ML IJ SOLN
10.0000 mg | Freq: Once | INTRAMUSCULAR | Status: AC
  Administered 2016-09-11: 10 mg via INTRAVENOUS
  Filled 2016-09-11: qty 2

## 2016-09-11 MED ORDER — HYDROMORPHONE HCL 1 MG/ML IJ SOLN
1.0000 mg | Freq: Once | INTRAMUSCULAR | Status: AC
  Administered 2016-09-11: 1 mg via INTRAVENOUS
  Filled 2016-09-11: qty 1

## 2016-09-11 NOTE — ED Notes (Signed)
Female urinal attempted, unsuccessful.

## 2016-09-11 NOTE — Discharge Instructions (Signed)
Please follow-up with your doctor at her scheduled appointment on Wednesday, or sooner if possible. Please return to the emergency department if you develop any new or worsening symptoms including intractable vomiting despite your home medications, fever, or any other concerning symptoms.

## 2016-09-11 NOTE — ED Triage Notes (Signed)
Pt reports upper abd pain since yesterday accompanied by n/v. Hx of pancreatitis, feels like same. Also has diarrhea.

## 2016-09-11 NOTE — ED Notes (Addendum)
Informed Armstead Peaks PA of unchanged pain and nausea.

## 2016-09-11 NOTE — ED Provider Notes (Signed)
[place] DEPT Provider Note   CSN: [id number] Arrival date & time: 09/11/16  1354     History   Chief Complaint Chief Complaint  Patient presents with  . Abdominal Pain    HPI [name] is a 66 y.o. female with history of pancreatitis who presents with a one-day history of upper abdominal pain. Patient has had associated nausea, vomiting, and diarrhea. She denies bloody stools. She's not able to keep down any food or fluids. She reports urgency since her symptoms began and difficulty urinating. She denies dysuria or frequency. She has taken Vicodin and Dilaudid at home without significant relief. She did note that she had more relief with Dilaudid. Patient denies any fever, chest pain. Patient does note that she has shortness of breath, however she is on 2 L of oxygen at home and she had not yet been set up on [facility] for my evaluation. Patient reports she has not had EtOH in 4 months.   Abdominal Pain   Associated symptoms include diarrhea, nausea and vomiting. Pertinent negatives include fever, dysuria and headaches.    Past Medical History:  Diagnosis Date  . Cancer ([place])    renal ca  . COPD (chronic obstructive pulmonary disease) ([place])   . Pancreatitis   . Pancreatitis   . Seizures ([place])   . Substance abuse     Patient Active Problem List   Diagnosis Date Noted  . Hip fracture, right ([place]) 06/02/2016  . Narcotic addiction ([place]) 02/29/2016  . Abdominal pain, chronic, epigastric- due to chronic pancreatitis 02/29/2016  . COPD (chronic obstructive pulmonary disease) ([place]) 02/28/2016  . Cough 09/03/2015  . Abdominal pain 01/03/2015  . Hyponatremia 01/03/2015  . Hypocalcemia 01/03/2015  . Underweight 01/03/2015  . COPD GOLD III with reversible component  07/17/2014  . DTs (delirium tremens) ([place]) 06/02/2013  . Chronic alcoholic pancreatitis ([place]) 06/02/2013  . Lactic acidosis 06/02/2013  . Recurrent acute pancreatitis 03/28/2013  . Alcohol withdrawal  ([place]) 03/28/2013  . Pancreatitis 03/09/2013  . Tobacco abuse 03/09/2013  . Alcohol withdrawal syndrome with perceptual disturbance ([place]) 03/09/2013  . Acute alcoholic pancreatitis 60/10/9321  . Benzodiazepine withdrawal ([place]) 02/09/2013  . Protein-calorie malnutrition, severe ([place]) 02/09/2013  . Hypokalemia 02/27/2011  . Abdominal pain, acute 02/25/2011  . Nausea and vomiting 02/25/2011  . Thrombocytopenia ([place]) 02/25/2011  . COPD with acute exacerbation ([place]) 02/25/2011  . HTN (hypertension), malignant 02/25/2011  . KIDNEY CANCER 10/18/2008  . Anxiety state 10/18/2008  . GERD 10/18/2008  . CIRRHOSIS 10/18/2008  . PANCREATITIS, CHRONIC- atrophic pancreas 10/18/2008  . DYSPNEA 10/18/2008  . GASTRIC ULCER, HX OF 10/18/2008    Past Surgical History:  Procedure Laterality Date  . IR GENERIC HISTORICAL  05/08/2015   IR RADIOLOGIST EVAL & MGMT 05/08/2015 Aletta Edouard, MD [facility] INTERV RAD  . IR GENERIC HISTORICAL  01/08/2014   IR RADIOLOGIST EVAL & MGMT 01/08/2014 Aletta Edouard, MD [facility] INTERV RAD  . KIDNEY SURGERY     removed cancerous lesions  . PARTIAL GASTRECTOMY      OB History    No data available       Home Medications    Prior to Admission medications   Medication Sig Start Date End Date Taking? Authorizing Provider  albuterol (PROVENTIL HFA;VENTOLIN HFA) 108 (90 Base) MCG/ACT inhaler Inhale 2 puffs into the lungs every 6 (six) hours as needed for wheezing or shortness of breath. 02/27/15  Yes McQuaidRonie Spies, MD  CREON 12000 units CPEP capsule Take 12,000 Units by  mouth 3 (three) times daily with meals. 08/16/16  Yes [provider]  [place] 200-5 MCG/ACT AERO INHALE 2 PUFFS INTO THE LUNGS TWICE DAILY Patient taking differently: INHALE 2 PUFFS INTO THE LUNGS TWICE DAILY prn for wheezing 12/01/15  Yes Juanito Doom, MD  HYDROmorphone (DILAUDID) 2 MG tablet TK 1 Tabley by mouth every 6 Hours as needed for pain 06/10/16  Yes [provider]  LORazepam  (ATIVAN) 1 MG tablet Take 1 tablet (1 mg total) by mouth every 6 (six) hours as needed. For anxiety. Patient taking differently: Take 1 mg by mouth every 6 (six) hours as needed for anxiety. For anxiety. 04/01/13  Yes Isaac Bliss, Rayford Halsted, MD  losartan (COZAAR) 100 MG tablet Take 1 tablet (100 mg total) by mouth daily. 09/03/15  Yes Parrett, Tammy S, NP  ondansetron (ZOFRAN ODT) 4 MG disintegrating tablet Take 1 tablet (4 mg total) by mouth every 8 (eight) hours as needed for nausea or vomiting. 08/10/16  Yes Duffy Bruce, MD  SPIRIVA HANDIHALER 18 MCG inhalation capsule INHALE 1 CAPSULE VIA HANDIHALER ONCE DAILY 11/07/15  Yes Juanito Doom, MD  benzonatate (TESSALON) 200 MG capsule Take 1 capsule (200 mg total) by mouth 3 (three) times daily as needed for cough. Patient not taking: Reported on 09/11/2016 02/29/16   Debbe Odea, MD  bisacodyl (DULCOLAX) 10 MG suppository Place 1 suppository (10 mg total) rectally as needed for moderate constipation. Patient not taking: Reported on 09/11/2016 02/29/16   Debbe Odea, MD  cephALEXin (KEFLEX) 500 MG capsule Take 1 capsule (500 mg total) by mouth 3 (three) times daily. Patient not taking: Reported on 09/11/2016 08/10/16   Rolland Porter, MD  feeding supplement, ENSURE ENLIVE, (ENSURE ENLIVE) LIQD Take 237 mLs by mouth 3 (three) times daily between meals. Patient not taking: Reported on 08/10/2016 02/29/16   Debbe Odea, MD  ipratropium-albuterol (DUONEB) 0.5-2.5 (3) MG/3ML SOLN Take 3 mLs by nebulization every 6 (six) hours as needed (dyspnea not releived by inhaler). Patient not taking: Reported on 09/11/2016 02/29/16   Debbe Odea, MD  potassium chloride (KLOR-CON) 20 MEQ packet Take 40 mEq by mouth 2 (two) times daily. Patient not taking: Reported on 09/11/2016 08/10/16 08/13/16  Duffy Bruce, MD  rivaroxaban (XARELTO) 10 MG TABS tablet Take 10 mg by mouth daily.    [provider]    Family History Family History  Problem Relation Age of  Onset  . CAD Mother   . Alcoholism Father   . COPD Father     Social History Social History  Substance Use Topics  . Smoking status: Current Every Day Smoker    Packs/day: 1.00    Years: 30.00    Types: Cigarettes  . Smokeless tobacco: Never Used     Comment: smoking up to 1.5 ppd per husband.   . Alcohol use 0.0 oz/week     Allergies   Aspirin and Chlorpromazine hcl   Review of Systems Review of Systems  Constitutional: Negative for chills and fever.  HENT: Negative for facial swelling and sore throat.   Respiratory: Positive for shortness of breath (baseline).   Cardiovascular: Negative for chest pain.  Gastrointestinal: Positive for abdominal pain, diarrhea, nausea and vomiting. Negative for blood in stool.  Genitourinary: Positive for decreased urine volume. Negative for dysuria.  Musculoskeletal: Negative for back pain.  Skin: Negative for rash and wound.  Neurological: Negative for headaches.  Psychiatric/Behavioral: The patient is not nervous/anxious.      Physical Exam Updated Vital  Signs BP 119/79 (BP Location: Right Arm)   Pulse (!) 103   Temp 98.4 F (36.9 C) (Oral)   Resp (!) 24   Wt 36.3 kg (80 lb)   SpO2 100%   BMI 13.73 kg/m   Physical Exam  Constitutional: She appears well-developed and well-nourished. No distress.  HENT:  Head: Normocephalic and atraumatic.  Mouth/Throat: Oropharynx is clear and moist. No oropharyngeal exudate.  Eyes: Pupils are equal, round, and reactive to light. Conjunctivae are normal. Right eye exhibits no discharge. Left eye exhibits no discharge. No scleral icterus.  Neck: Normal range of motion. Neck supple. No thyromegaly present.  Cardiovascular: Normal rate, regular rhythm, normal heart sounds and intact distal pulses.  Exam reveals no gallop and no friction rub.   No murmur heard. Pulmonary/Chest: Effort normal and breath sounds normal. No stridor. No respiratory distress. She has no wheezes. She has no rales.    Abdominal: Soft. Bowel sounds are normal. She exhibits no distension. There is tenderness in the right upper quadrant, epigastric area and left upper quadrant. There is no rebound and no guarding.  Musculoskeletal: She exhibits no edema.  Lymphadenopathy:    She has no cervical adenopathy.  Neurological: She is alert. Coordination normal.  Skin: Skin is warm and dry. No rash noted. She is not diaphoretic. No pallor.  Psychiatric: She has a normal mood and affect.  Nursing note and vitals reviewed.    ED Treatments / Results  Labs (all labs ordered are listed, but only abnormal results are displayed) Labs Reviewed  COMPREHENSIVE METABOLIC PANEL - Abnormal; Notable for the following:       Result Value   Sodium 131 (*)    Chloride 97 (*)    Glucose, Bld 105 (*)    ALT 10 (*)    All other components within normal limits  CBC - Abnormal; Notable for the following:    WBC 11.3 (*)    All other components within normal limits  URINALYSIS, ROUTINE W REFLEX MICROSCOPIC - Abnormal; Notable for the following:    Color, Urine STRAW (*)    Hgb urine dipstick MODERATE (*)    All other components within normal limits  LIPASE, BLOOD    EKG  EKG Interpretation None       Radiology No results found.  Procedures Procedures (including critical care time)  Medications Ordered in ED Medications  sodium chloride 0.9 % bolus 1,000 mL (0 mLs Intravenous Stopped 09/11/16 1653)  ondansetron (ZOFRAN) injection 4 mg (4 mg Intravenous Given 09/11/16 1617)  HYDROmorphone (DILAUDID) injection 1 mg (1 mg Intravenous Given 09/11/16 1620)  HYDROmorphone (DILAUDID) injection 1 mg (1 mg Intravenous Given 09/11/16 1659)  metoCLOPramide (REGLAN) injection 10 mg (10 mg Intravenous Given 09/11/16 1659)  fentaNYL (SUBLIMAZE) injection 50 mcg (50 mcg Intravenous Given 09/11/16 1833)  fentaNYL (SUBLIMAZE) injection 50 mcg (50 mcg Intravenous Given 09/11/16 2042)  metoCLOPramide (REGLAN) injection 10 mg (10 mg  Intravenous Given 09/11/16 2123)     Initial Impression / Assessment and Plan / ED Course  I have reviewed the triage vital signs and the nursing notes.  Pertinent labs & imaging results that were available during my care of the patient were reviewed by me and considered in my medical decision making (see chart for details).     Pain localized to left upper quadrant after Dilaudid. No longer tender to right upper quadrant.  Patient's pain difficult to control the ED despite total of 2 mg Dilaudid, 100 g of fentanyl.  Nausea vomiting controlled with Reglan. Labs show mild elevation WBC, 11.3. Lipase within normal limits. CMP shows sodium 131, chloride 97. UA shows moderate hematuria, however looks to be chronic for patient. I offered right upper quadrant ultrasound to rule out gallbladder etiology, however patient declined. Attempted to admission for pain control, however [facility], Dr. Alcario Drought, reports he cannot admit without intractable vomiting. Patient is tolerating PO. We'll discharge patient home with follow-up to PCP. Return precautions discussed. Patient understands and agrees with plan. Patient discharged in satisfactory condition.  Final Clinical Impressions(s) / ED Diagnoses   Final diagnoses:  Left upper quadrant pain  Nausea vomiting and diarrhea    New Prescriptions Discharge Medication List as of 09/11/2016 10:03 PM       Frederica Kuster, PA-C 09/11/16 2242    Isla Pence, MD 09/11/16 2256

## 2016-09-11 NOTE — ED Notes (Signed)
P.O. Challenge done. Patient tolerated well.

## 2016-09-11 NOTE — ED Notes (Addendum)
Pt reports she is a very hard stick, has to have an ultrasound IV, and they sometimes have to give pain medications IM. Informed PA Armstead Peaks. Waiting for new order

## 2016-09-17 ENCOUNTER — Ambulatory Visit: Payer: Medicaid Other | Admitting: Pulmonary Disease

## 2016-09-23 ENCOUNTER — Ambulatory Visit: Payer: Medicaid Other | Admitting: Acute Care

## 2016-10-07 ENCOUNTER — Encounter (HOSPITAL_COMMUNITY): Payer: Self-pay | Admitting: Emergency Medicine

## 2016-10-07 DIAGNOSIS — Z79899 Other long term (current) drug therapy: Secondary | ICD-10-CM | POA: Diagnosis not present

## 2016-10-07 DIAGNOSIS — J449 Chronic obstructive pulmonary disease, unspecified: Secondary | ICD-10-CM | POA: Insufficient documentation

## 2016-10-07 DIAGNOSIS — G894 Chronic pain syndrome: Secondary | ICD-10-CM | POA: Diagnosis not present

## 2016-10-07 DIAGNOSIS — I10 Essential (primary) hypertension: Secondary | ICD-10-CM | POA: Diagnosis not present

## 2016-10-07 DIAGNOSIS — R1012 Left upper quadrant pain: Secondary | ICD-10-CM | POA: Diagnosis present

## 2016-10-07 DIAGNOSIS — F17210 Nicotine dependence, cigarettes, uncomplicated: Secondary | ICD-10-CM | POA: Diagnosis not present

## 2016-10-07 NOTE — ED Triage Notes (Addendum)
Pt brought in by EMS with c/o abd pain off and on for about 2 weeks  Pt states she has nausea and diarrhea  Pt states she cannot eat due to the nausea  Pt is c/o right hip pain  States she broke her hip before but the pain has not gone away  Pt states she ran out of her pain medication last night  States it was dilaudid and she took vicodin today but it has not done any good

## 2016-10-08 ENCOUNTER — Emergency Department (HOSPITAL_COMMUNITY): Payer: Medicaid Other

## 2016-10-08 ENCOUNTER — Emergency Department (HOSPITAL_COMMUNITY)
Admission: EM | Admit: 2016-10-08 | Discharge: 2016-10-08 | Disposition: A | Discharge status: Home / Self Care | Payer: Medicaid Other | Attending: Emergency Medicine | Admitting: Emergency Medicine

## 2016-10-08 DIAGNOSIS — G894 Chronic pain syndrome: Secondary | ICD-10-CM

## 2016-10-08 MED ORDER — ONDANSETRON HCL 4 MG/2ML IJ SOLN
4.0000 mg | Freq: Once | INTRAMUSCULAR | Status: AC
  Administered 2016-10-08: 4 mg via INTRAVENOUS
  Filled 2016-10-08: qty 2

## 2016-10-08 MED ORDER — HYDROMORPHONE HCL 2 MG PO TABS
2.0000 mg | ORAL_TABLET | Freq: Once | ORAL | Status: AC
  Administered 2016-10-08: 2 mg via ORAL
  Filled 2016-10-08: qty 1

## 2016-10-08 MED ORDER — SODIUM CHLORIDE 0.9 % IV BOLUS (SEPSIS): 1000.0000 mL | Freq: Once | INTRAVENOUS | Status: DC

## 2016-10-08 MED ORDER — HYDROMORPHONE HCL 1 MG/ML IJ SOLN
1.0000 mg | Freq: Once | INTRAMUSCULAR | Status: AC
  Administered 2016-10-08: 1 mg via INTRAVENOUS
  Filled 2016-10-08: qty 1

## 2016-10-08 MED ORDER — ONDANSETRON 4 MG PO TBDP
ORAL_TABLET | ORAL | Status: AC
  Administered 2016-10-08: 4 mg
  Filled 2016-10-08: qty 1

## 2016-10-08 NOTE — ED Notes (Signed)
Pt refused IV

## 2016-10-08 NOTE — ED Notes (Signed)
Pt refused to give blood

## 2016-10-08 NOTE — ED Provider Notes (Signed)
[facility] DEPT Provider Note   CSN: [id number] Arrival date & time: 10/07/16  1950     History   Chief Complaint Chief Complaint  Patient presents with  . Abdominal Pain  . Hip Pain    HPI DHALIA ZINGARO is a 66 y.o. female.  HPI  66 year old female with a history of pancreatitis, COPD, and right hip fracture in May 2018 presents with upper abdominal pain and right hip pain. She states that she's had pain in her right hip since breaking it in having it repaired but it is worse over the past 1 week. Has also been having left upper quadrant abdominal pain with nausea for the past 1 week. All the symptoms are worse over the last 24 hours. About 24 hours ago she ran out of her Dilaudid. She states this is a few days earlier than she should've. Due to increased hip pain she has taken extra. She still has her hydrocodone. No fevers or vomiting but she has felt nauseated. The abdominal pain feels like prior pancreatitis. No urinary symptoms. No fevers or skin changes. No back pain. Is having a hard time walking due to the hip pain. No weakness/numbness.   Past Medical History:  Diagnosis Date  . Cancer ([place])    renal ca  . COPD (chronic obstructive pulmonary disease) ([place])   . Pancreatitis   . Pancreatitis   . Seizures ([place])   . Substance abuse [facility])     Patient Active Problem List   Diagnosis Date Noted  . Hip fracture, right ([place]) 06/02/2016  . Narcotic addiction ([place]) 02/29/2016  . Abdominal pain, chronic, epigastric- due to chronic pancreatitis 02/29/2016  . COPD (chronic obstructive pulmonary disease) ([place]) 02/28/2016  . Cough 09/03/2015  . Abdominal pain 01/03/2015  . Hyponatremia 01/03/2015  . Hypocalcemia 01/03/2015  . Underweight 01/03/2015  . COPD GOLD III with reversible component  07/17/2014  . DTs (delirium tremens) ([place]) 06/02/2013  . Chronic alcoholic pancreatitis ([place]) 06/02/2013  . Lactic acidosis 06/02/2013  . Recurrent acute pancreatitis 03/28/2013    . Alcohol withdrawal ([place]) 03/28/2013  . Pancreatitis 03/09/2013  . Tobacco abuse 03/09/2013  . Alcohol withdrawal syndrome with perceptual disturbance ([place]) 03/09/2013  . Acute alcoholic pancreatitis 78/29/5621  . Benzodiazepine withdrawal ([place]) 02/09/2013  . Protein-calorie malnutrition, severe ([place]) 02/09/2013  . Hypokalemia 02/27/2011  . Abdominal pain, acute 02/25/2011  . Nausea and vomiting 02/25/2011  . Thrombocytopenia ([place]) 02/25/2011  . COPD with acute exacerbation ([place]) 02/25/2011  . HTN (hypertension), malignant 02/25/2011  . KIDNEY CANCER 10/18/2008  . Anxiety state 10/18/2008  . GERD 10/18/2008  . CIRRHOSIS 10/18/2008  . PANCREATITIS, CHRONIC- atrophic pancreas 10/18/2008  . DYSPNEA 10/18/2008  . GASTRIC ULCER, HX OF 10/18/2008    Past Surgical History:  Procedure Laterality Date  . IR GENERIC HISTORICAL  05/08/2015   IR RADIOLOGIST EVAL & MGMT 05/08/2015 Aletta Edouard, MD [facility] INTERV RAD  . IR GENERIC HISTORICAL  01/08/2014   IR RADIOLOGIST EVAL & MGMT 01/08/2014 Aletta Edouard, MD [facility] INTERV RAD  . KIDNEY SURGERY     removed cancerous lesions  . PARTIAL GASTRECTOMY      OB History    No data available       Home Medications    Prior to Admission medications   Medication Sig Start Date End Date Taking? Authorizing Provider  albuterol (PROVENTIL HFA;VENTOLIN HFA) 108 (90 Base) MCG/ACT inhaler Inhale 2 puffs into the lungs every 6 (six) hours as needed for wheezing or shortness of  breath. 02/27/15  Yes Juanito Doom, MD  CREON 12000 units CPEP capsule Take 12,000 Units by mouth 3 (three) times daily with meals. 08/16/16  Yes [provider]  [place] 200-5 MCG/ACT AERO INHALE 2 PUFFS INTO THE LUNGS TWICE DAILY Patient taking differently: INHALE 2 PUFFS INTO THE LUNGS TWICE DAILY prn for wheezing 12/01/15  Yes Juanito Doom, MD  HYDROmorphone (DILAUDID) 2 MG tablet TK 1 Tabley by mouth every 6 Hours as needed for pain 06/10/16  Yes [provider]  ipratropium-albuterol (DUONEB) 0.5-2.5 (3) MG/3ML SOLN Take 3 mLs by nebulization every 6 (six) hours as needed (dyspnea not releived by inhaler). 02/29/16  Yes Debbe Odea, MD  LORazepam (ATIVAN) 1 MG tablet Take 1 tablet (1 mg total) by mouth every 6 (six) hours as needed. For anxiety. Patient taking differently: Take 1 mg by mouth every 6 (six) hours as needed for anxiety. For anxiety. 04/01/13  Yes Isaac Bliss, Rayford Halsted, MD  losartan (COZAAR) 100 MG tablet Take 1 tablet (100 mg total) by mouth daily. 09/03/15  Yes Parrett, Tammy S, NP  ondansetron (ZOFRAN ODT) 4 MG disintegrating tablet Take 1 tablet (4 mg total) by mouth every 8 (eight) hours as needed for nausea or vomiting. 08/10/16  Yes Duffy Bruce, MD  SPIRIVA HANDIHALER 18 MCG inhalation capsule INHALE 1 CAPSULE VIA HANDIHALER ONCE DAILY 11/07/15  Yes Juanito Doom, MD    Family History Family History  Problem Relation Age of Onset  . CAD Mother   . Alcoholism Father   . COPD Father     Social History Social History  Substance Use Topics  . Smoking status: Current Every Day Smoker    Packs/day: 1.00    Years: 30.00    Types: Cigarettes  . Smokeless tobacco: Never Used     Comment: smoking up to 1.5 ppd per husband.   . Alcohol use 0.0 oz/week     Allergies   Aspirin and Chlorpromazine hcl   Review of Systems Review of Systems  Constitutional: Negative for fever.  Respiratory: Negative for shortness of breath.   Cardiovascular: Negative for chest pain.  Gastrointestinal: Positive for abdominal pain, diarrhea and nausea. Negative for vomiting.  Musculoskeletal: Positive for arthralgias (right hip). Negative for back pain.  All other systems reviewed and are negative.    Physical Exam Updated Vital Signs BP (!) 159/105 (BP Location: Left Arm)   Pulse 89   Temp 99 F (37.2 C) (Oral)   Resp 18   SpO2 99%   Physical Exam  Constitutional: She is oriented to person, place, and time.  She appears well-developed. She appears cachectic.  HENT:  Head: Normocephalic and atraumatic.  Right Ear: External ear normal.  Left Ear: External ear normal.  Nose: Nose normal.  Eyes: Right eye exhibits no discharge. Left eye exhibits no discharge.  Cardiovascular: Normal rate, regular rhythm and normal heart sounds.   Pulmonary/Chest: Effort normal and breath sounds normal.  Abdominal: Soft. There is tenderness in the left upper quadrant.  Well healed midline scar  Musculoskeletal:       Right hip: She exhibits tenderness (lateral, over greater trochanter). She exhibits normal range of motion and no swelling.  Neurological: She is alert and oriented to person, place, and time.  Skin: Skin is warm and dry. She is not diaphoretic.  Nursing note and vitals reviewed.    ED Treatments / Results  Labs (all labs ordered are listed, but only abnormal results are displayed) Labs  Reviewed - No data to display  EKG  EKG Interpretation None       Radiology Dg Hip Unilat W Or Wo Pelvis 2-3 Views Right  Result Date: 10/08/2016 CLINICAL DATA:  Fall 4 months ago with subsequent hip surgery. Right hip pain beginning 1-2 weeks ago worse last night. No recent injury. EXAM: DG HIP (WITH OR WITHOUT PELVIS) 2-3V RIGHT COMPARISON:  11/16/2014 and CT 02/28/2016 FINDINGS: There is diffuse osteopenia. Hardware over the left femoral neck and shaft intact and unchanged. Mild degenerate change of the hips. Intramedullary nail with associated compression screw bridging the right femoral neck into the right femoral head intact and normally positioned. No evidence of acute fracture or dislocation. Minimal degenerative change of the spine. IMPRESSION: No acute findings. Hardware intact over both femurs. Diffuse osteopenia. Electronically Signed   By: Marin Olp M.D.   On: 10/08/2016 02:08    Procedures Procedures (including critical care time)  Medications Ordered in ED Medications  sodium chloride  0.9 % bolus 1,000 mL (1,000 mLs Intravenous Not Given 10/08/16 0115)  HYDROmorphone (DILAUDID) injection 1 mg (1 mg Intravenous Given 10/08/16 0115)  HYDROmorphone (DILAUDID) tablet 2 mg (2 mg Oral Given 10/08/16 0116)  ondansetron (ZOFRAN) injection 4 mg (4 mg Intravenous Given 10/08/16 0116)  ondansetron (ZOFRAN-ODT) 4 MG disintegrating tablet (4 mg  Given 10/08/16 0131)     Initial Impression / Assessment and Plan / ED Course  I have reviewed the triage vital signs and the nursing notes.  Pertinent labs & imaging results that were available during my care of the patient were reviewed by me and considered in my medical decision making (see chart for details).     Besides moderate hypertension, her vital signs are unremarkable. Her right hip pain is more of a chronic pain issue as she has full range of motion, no swelling, and intact hardware/normal x-ray. Her abdominal pain a think is likely chronic pain/recurrent or chronic pancreatitis. She declined IV and so the nursing staff switched her IV meds to IM. She was also given her oral Dilaudid dose. I discussed the need for lab work to help rule out electrolyte abnormalities or significant pancreatitis on lab work. However she declines when offered for just lab sticks and wants to go home. Given no vomiting, think this is all chronic issues and she can be discharged home to follow-up with her PCP. Discussed return precautions.  Final Clinical Impressions(s) / ED Diagnoses   Final diagnoses:  Chronic pain syndrome    New Prescriptions Discharge Medication List as of 10/08/2016  3:12 AM       Sherwood Gambler, MD 10/08/16 [phone]

## 2016-10-12 ENCOUNTER — Emergency Department (HOSPITAL_COMMUNITY)
Admission: EM | Admit: 2016-10-12 | Discharge: 2016-10-12 | Disposition: A | Discharge status: Home / Self Care | Payer: Medicaid Other | Attending: Emergency Medicine | Admitting: Emergency Medicine

## 2016-10-12 ENCOUNTER — Encounter (HOSPITAL_COMMUNITY): Payer: Self-pay | Admitting: *Deleted

## 2016-10-12 ENCOUNTER — Emergency Department (HOSPITAL_COMMUNITY): Payer: Medicaid Other

## 2016-10-12 DIAGNOSIS — R109 Unspecified abdominal pain: Secondary | ICD-10-CM | POA: Diagnosis present

## 2016-10-12 DIAGNOSIS — Z79899 Other long term (current) drug therapy: Secondary | ICD-10-CM | POA: Insufficient documentation

## 2016-10-12 DIAGNOSIS — E871 Hypo-osmolality and hyponatremia: Secondary | ICD-10-CM | POA: Diagnosis not present

## 2016-10-12 DIAGNOSIS — J449 Chronic obstructive pulmonary disease, unspecified: Secondary | ICD-10-CM | POA: Diagnosis not present

## 2016-10-12 DIAGNOSIS — Z85528 Personal history of other malignant neoplasm of kidney: Secondary | ICD-10-CM | POA: Insufficient documentation

## 2016-10-12 DIAGNOSIS — G8929 Other chronic pain: Secondary | ICD-10-CM

## 2016-10-12 DIAGNOSIS — F17210 Nicotine dependence, cigarettes, uncomplicated: Secondary | ICD-10-CM | POA: Diagnosis not present

## 2016-10-12 DIAGNOSIS — K529 Noninfective gastroenteritis and colitis, unspecified: Secondary | ICD-10-CM

## 2016-10-12 DIAGNOSIS — K5289 Other specified noninfective gastroenteritis and colitis: Secondary | ICD-10-CM | POA: Diagnosis not present

## 2016-10-12 LAB — COMPREHENSIVE METABOLIC PANEL
ALK PHOS: 128 U/L — AB (ref 38–126)
ALT: 11 U/L — AB (ref 14–54)
ANION GAP: 12 (ref 5–15)
AST: 15 U/L (ref 15–41)
Albumin: 4.2 g/dL (ref 3.5–5.0)
BUN: <5 mg/dL — ABNORMAL LOW (ref 6–20)
CALCIUM: 9.7 mg/dL (ref 8.9–10.3)
CO2: 22 mmol/L (ref 22–32)
CREATININE: 0.91 mg/dL (ref 0.44–1.00)
Chloride: 93 mmol/L — ABNORMAL LOW (ref 101–111)
Glucose, Bld: 117 mg/dL — ABNORMAL HIGH (ref 65–99)
Potassium: 3.7 mmol/L (ref 3.5–5.1)
Sodium: 127 mmol/L — ABNORMAL LOW (ref 135–145)
Total Bilirubin: 0.6 mg/dL (ref 0.3–1.2)
Total Protein: 7.7 g/dL (ref 6.5–8.1)

## 2016-10-12 LAB — CBC WITH DIFFERENTIAL/PLATELET
BASOS ABS: 0 10*3/uL (ref 0.0–0.1)
BASOS PCT: 0 %
EOS ABS: 0.1 10*3/uL (ref 0.0–0.7)
EOS PCT: 1 %
HCT: 39.8 % (ref 36.0–46.0)
Hemoglobin: 14.4 g/dL (ref 12.0–15.0)
Lymphocytes Relative: 23 %
Lymphs Abs: 2.2 10*3/uL (ref 0.7–4.0)
MCH: 31 pg (ref 26.0–34.0)
MCHC: 36.2 g/dL — ABNORMAL HIGH (ref 30.0–36.0)
MCV: 85.8 fL (ref 78.0–100.0)
Monocytes Absolute: 0.7 10*3/uL (ref 0.1–1.0)
Monocytes Relative: 8 %
Neutro Abs: 6.3 10*3/uL (ref 1.7–7.7)
Neutrophils Relative %: 68 %
PLATELETS: 268 10*3/uL (ref 150–400)
RBC: 4.64 MIL/uL (ref 3.87–5.11)
RDW: 13.3 % (ref 11.5–15.5)
WBC: 9.2 10*3/uL (ref 4.0–10.5)

## 2016-10-12 LAB — POC OCCULT BLOOD, ED: FECAL OCCULT BLD: NEGATIVE

## 2016-10-12 LAB — AMMONIA: Ammonia: 21 umol/L (ref 9–35)

## 2016-10-12 LAB — I-STAT TROPONIN, ED: TROPONIN I, POC: 0.01 ng/mL (ref 0.00–0.08)

## 2016-10-12 LAB — LIPASE, BLOOD: LIPASE: 21 U/L (ref 11–51)

## 2016-10-12 LAB — I-STAT CG4 LACTIC ACID, ED: LACTIC ACID, VENOUS: 1.41 mmol/L (ref 0.5–1.9)

## 2016-10-12 MED ORDER — ONDANSETRON HCL 4 MG/2ML IJ SOLN
4.0000 mg | Freq: Once | INTRAMUSCULAR | Status: AC
  Administered 2016-10-12: 4 mg via INTRAVENOUS
  Filled 2016-10-12: qty 2

## 2016-10-12 MED ORDER — METRONIDAZOLE 500 MG PO TABS
500.0000 mg | ORAL_TABLET | Freq: Once | ORAL | Status: AC
  Administered 2016-10-12: 500 mg via ORAL
  Filled 2016-10-12: qty 1

## 2016-10-12 MED ORDER — DIPHENHYDRAMINE HCL 50 MG/ML IJ SOLN
25.0000 mg | Freq: Once | INTRAMUSCULAR | Status: AC
  Administered 2016-10-12: 25 mg via INTRAVENOUS
  Filled 2016-10-12: qty 1

## 2016-10-12 MED ORDER — HYDROMORPHONE HCL 1 MG/ML IJ SOLN
1.0000 mg | Freq: Once | INTRAMUSCULAR | Status: AC
  Administered 2016-10-12: 1 mg via INTRAVENOUS
  Filled 2016-10-12: qty 1

## 2016-10-12 MED ORDER — IOPAMIDOL (ISOVUE-300) INJECTION 61%
100.0000 mL | Freq: Once | INTRAVENOUS | Status: AC | PRN
  Administered 2016-10-12: 75 mL via INTRAVENOUS

## 2016-10-12 MED ORDER — IOPAMIDOL (ISOVUE-300) INJECTION 61%
INTRAVENOUS | Status: AC
  Administered 2016-10-12: 75 mL via INTRAVENOUS
  Filled 2016-10-12: qty 100

## 2016-10-12 MED ORDER — METOCLOPRAMIDE HCL 5 MG/ML IJ SOLN
10.0000 mg | Freq: Once | INTRAMUSCULAR | Status: AC
  Administered 2016-10-12: 10 mg via INTRAVENOUS
  Filled 2016-10-12: qty 2

## 2016-10-12 MED ORDER — METRONIDAZOLE 500 MG PO TABS: 500.0000 mg | ORAL_TABLET | Freq: Three times a day (TID) | ORAL | 0 refills | Status: DC

## 2016-10-12 MED ORDER — SODIUM CHLORIDE 0.9 % IV BOLUS (SEPSIS)
1000.0000 mL | Freq: Once | INTRAVENOUS | Status: AC
  Administered 2016-10-12: 1000 mL via INTRAVENOUS

## 2016-10-12 MED ORDER — CIPROFLOXACIN HCL 500 MG PO TABS: 500.0000 mg | ORAL_TABLET | Freq: Two times a day (BID) | ORAL | 0 refills | Status: DC

## 2016-10-12 MED ORDER — IOPAMIDOL (ISOVUE-300) INJECTION 61%
INTRAVENOUS | Status: AC
  Administered 2016-10-12: 30 mL via ORAL
  Filled 2016-10-12: qty 30

## 2016-10-12 MED ORDER — IOPAMIDOL (ISOVUE-300) INJECTION 61%
30.0000 mL | Freq: Once | INTRAVENOUS | Status: AC | PRN
  Administered 2016-10-12: 30 mL via ORAL

## 2016-10-12 MED ORDER — CIPROFLOXACIN HCL 500 MG PO TABS
500.0000 mg | ORAL_TABLET | Freq: Once | ORAL | Status: AC
  Administered 2016-10-12: 500 mg via ORAL
  Filled 2016-10-12: qty 1

## 2016-10-12 NOTE — ED Notes (Signed)
Bed: WA09 Expected date:  Expected time:  Means of arrival:  Comments: 

## 2016-10-12 NOTE — ED Provider Notes (Signed)
[facility] DEPT Provider Note   CSN: [id number] Arrival date & time: 10/12/16  0055     History   Chief Complaint No chief complaint on file.   HPI [name] is a 66 y.o. female.  The history is provided by the spouse. The history is limited by the condition of the patient (altered mental status).  She is brought in by her husband because of uncontrolled pain, diarrhea, altered mentation. He relates that she has been having diarrhea for the last 3 weeks. She had been in the emergency department 3 days ago, but had refused lab work. Husband states that she takes hydromorphone at home for pain, but this is not controlling her pain. She is complaining of abdominal and chest pain. She does have history of pancreatitis. He states that she has not had any alcohol in the last 6 months. She has had some nausea and vomiting. He denies blood in the emesis or stool, but states that it has been black.  Past Medical History:  Diagnosis Date  . Cancer ([place])    renal ca  . COPD (chronic obstructive pulmonary disease) ([place])   . Pancreatitis   . Pancreatitis   . Seizures ([place])   . Substance abuse [facility])     Patient Active Problem List   Diagnosis Date Noted  . Hip fracture, right ([place]) 06/02/2016  . Narcotic addiction ([place]) 02/29/2016  . Abdominal pain, chronic, epigastric- due to chronic pancreatitis 02/29/2016  . COPD (chronic obstructive pulmonary disease) ([place]) 02/28/2016  . Cough 09/03/2015  . Abdominal pain 01/03/2015  . Hyponatremia 01/03/2015  . Hypocalcemia 01/03/2015  . Underweight 01/03/2015  . COPD GOLD III with reversible component  07/17/2014  . DTs (delirium tremens) ([place]) 06/02/2013  . Chronic alcoholic pancreatitis ([place]) 06/02/2013  . Lactic acidosis 06/02/2013  . Recurrent acute pancreatitis 03/28/2013  . Alcohol withdrawal ([place]) 03/28/2013  . Pancreatitis 03/09/2013  . Tobacco abuse 03/09/2013  . Alcohol withdrawal syndrome with perceptual disturbance ([place])  03/09/2013  . Acute alcoholic pancreatitis 16/38/4536  . Benzodiazepine withdrawal ([place]) 02/09/2013  . Protein-calorie malnutrition, severe ([place]) 02/09/2013  . Hypokalemia 02/27/2011  . Abdominal pain, acute 02/25/2011  . Nausea and vomiting 02/25/2011  . Thrombocytopenia ([place]) 02/25/2011  . COPD with acute exacerbation ([place]) 02/25/2011  . HTN (hypertension), malignant 02/25/2011  . KIDNEY CANCER 10/18/2008  . Anxiety state 10/18/2008  . GERD 10/18/2008  . CIRRHOSIS 10/18/2008  . PANCREATITIS, CHRONIC- atrophic pancreas 10/18/2008  . DYSPNEA 10/18/2008  . GASTRIC ULCER, HX OF 10/18/2008    Past Surgical History:  Procedure Laterality Date  . IR GENERIC HISTORICAL  05/08/2015   IR RADIOLOGIST EVAL & MGMT 05/08/2015 Aletta Edouard, MD [facility] INTERV RAD  . IR GENERIC HISTORICAL  01/08/2014   IR RADIOLOGIST EVAL & MGMT 01/08/2014 Aletta Edouard, MD [facility] INTERV RAD  . KIDNEY SURGERY     removed cancerous lesions  . PARTIAL GASTRECTOMY      OB History    No data available       Home Medications    Prior to Admission medications   Medication Sig Start Date End Date Taking? Authorizing Provider  albuterol (PROVENTIL HFA;VENTOLIN HFA) 108 (90 Base) MCG/ACT inhaler Inhale 2 puffs into the lungs every 6 (six) hours as needed for wheezing or shortness of breath. 02/27/15   Juanito Doom, MD  CREON 12000 units CPEP capsule Take 12,000 Units by mouth 3 (three) times daily with meals. 08/16/16   [provider]  D[place]ER[place] 200-5 MCG/ACT  AERO INHALE 2 PUFFS INTO THE LUNGS TWICE DAILY Patient taking differently: INHALE 2 PUFFS INTO THE LUNGS TWICE DAILY prn for wheezing 12/01/15   Juanito Doom, MD  HYDROmorphone (DILAUDID) 2 MG tablet TK 1 Tabley by mouth every 6 Hours as needed for pain 06/10/16   [provider]  ipratropium-albuterol (DUONEB) 0.5-2.5 (3) MG/3ML SOLN Take 3 mLs by nebulization every 6 (six) hours as needed (dyspnea not releived by inhaler). 02/29/16    Debbe Odea, MD  LORazepam (ATIVAN) 1 MG tablet Take 1 tablet (1 mg total) by mouth every 6 (six) hours as needed. For anxiety. Patient taking differently: Take 1 mg by mouth every 6 (six) hours as needed for anxiety. For anxiety. 04/01/13   Isaac Bliss, Rayford Halsted, MD  losartan (COZAAR) 100 MG tablet Take 1 tablet (100 mg total) by mouth daily. 09/03/15   Parrett, Fonnie Mu, NP  ondansetron (ZOFRAN ODT) 4 MG disintegrating tablet Take 1 tablet (4 mg total) by mouth every 8 (eight) hours as needed for nausea or vomiting. 08/10/16   Duffy Bruce, MD  SPIRIVA HANDIHALER 18 MCG inhalation capsule INHALE 1 CAPSULE VIA HANDIHALER ONCE DAILY 11/07/15   Juanito Doom, MD    Family History Family History  Problem Relation Age of Onset  . CAD Mother   . Alcoholism Father   . COPD Father     Social History Social History  Substance Use Topics  . Smoking status: Current Every Day Smoker    Packs/day: 1.00    Years: 30.00    Types: Cigarettes  . Smokeless tobacco: Never Used     Comment: smoking up to 1.5 ppd per husband.   . Alcohol use 0.0 oz/week     Allergies   Aspirin and Chlorpromazine hcl   Review of Systems Review of Systems  Unable to perform ROS: Mental status change     Physical Exam Updated Vital Signs BP (!) 61/44 (BP Location: Left Arm)   Pulse (!) 131   Resp 18   SpO2 98%   Physical Exam  Nursing note and vitals reviewed.  Cachectic 66 year old female, crying and somewhat agitated, but in no acute distress. Vital signs are significant for hypertension and tachycardia. Oxygen saturation is 98%, which is normal. Head is normocephalic and atraumatic. PERRLA, EOMI. Oropharynx is clear. Neck is nontender and supple without adenopathy or JVD. Back is nontender and there is no CVA tenderness. Lungs are clear without rales, wheezes, or rhonchi. Chest is nontender. Heart is tachycardic without murmur. Abdomen is soft, flat, with moderate tenderness diffusely.  There are no masses or hepatosplenomegaly and peristalsis is hypoactive. Rectal: Normal sphincter tone. Small amount of light brown stool present and sent for Hemoccult testing. Extremities have no cyanosis or edema, full range of motion is present. Skin is warm and dry without rash. Neurologic: She is agitated and is asking for pain medication, but is poorly cooperative for following commands, cranial nerves are intact, there are no motor or sensory deficits.  ED Treatments / Results  Labs (all labs ordered are listed, but only abnormal results are displayed) Labs Reviewed  COMPREHENSIVE METABOLIC PANEL - Abnormal; Notable for the following:       Result Value   Sodium 127 (*)    Chloride 93 (*)    Glucose, Bld 117 (*)    BUN <5 (*)    ALT 11 (*)    Alkaline Phosphatase 128 (*)    All other components within normal limits  CBC WITH DIFFERENTIAL/PLATELET - Abnormal; Notable for the following:    MCHC 36.2 (*)    All other components within normal limits  LIPASE, BLOOD  AMMONIA  I-STAT CG4 LACTIC ACID, ED  POC OCCULT BLOOD, ED  I-STAT TROPONIN, ED    EKG  EKG Interpretation  Date/Time:  Tuesday October 12 2016 01:10:05 EDT Ventricular Rate:  127 PR Interval:    QRS Duration: 80 QT Interval:  311 QTC Calculation: 452 R Axis:   85 Text Interpretation:  Sinus tachycardia LAE, consider biatrial enlargement Anterior infarct, old Minimal ST depression, diffuse leads When compared with ECG of 04/19/2016, ST depression in Diffuse leads  is now present Nonspecific T wave abnormality is no longer present HEART RATE has increased Confirmed by Delora Fuel ([id number]) on 10/12/2016 1:16:20 AM       Radiology No results found.  Procedures Procedures (including critical care time)  Medications Ordered in ED Medications  ciprofloxacin (CIPRO) tablet 500 mg (not administered)  metroNIDAZOLE (FLAGYL) tablet 500 mg (not administered)  sodium chloride 0.9 % bolus 1,000 mL (0 mLs  Intravenous Stopped 10/12/16 0446)  ondansetron (ZOFRAN) injection 4 mg (4 mg Intravenous Given 10/12/16 0158)  HYDROmorphone (DILAUDID) injection 1 mg (1 mg Intravenous Given 10/12/16 0158)  HYDROmorphone (DILAUDID) injection 1 mg (1 mg Intravenous Given 10/12/16 0202)  iopamidol (ISOVUE-300) 61 % injection 30 mL (30 mLs Oral Contrast Given 10/12/16 0427)  metoCLOPramide (REGLAN) injection 10 mg (10 mg Intravenous Given 10/12/16 0540)  diphenhydrAMINE (BENADRYL) injection 25 mg (25 mg Intravenous Given 10/12/16 0545)  HYDROmorphone (DILAUDID) injection 1 mg (1 mg Intravenous Given 10/12/16 0543)  iopamidol (ISOVUE-300) 61 % injection 100 mL (75 mLs Intravenous Contrast Given 10/12/16 6073)     Initial Impression / Assessment and Plan / ED Course  I have reviewed the triage vital signs and the nursing notes.  Pertinent labs & imaging results that were available during my care of the patient were reviewed by me and considered in my medical decision making (see chart for details).  Patient with chronic pain syndrome presents. He should be dehydrated. Difficult to assess whether her issue is just dehydration and poor pain control, or other pathology in play. She does have history of pancreatitis and lipase will be checked. She will you an IV fluids. Old records are reviewed, and she has multiple ED visits for chronic pain. ED visit on 10/5 notable for the patient refusing blood draw. At that time, she was treated with IM hydromorphone.  She was given IV fluids and ondansetron for nausea, hydromorphone for pain. She required additional dose of hydromorphone. Nausea and vomiting recurred and she was given metoclopramide with better control of nausea. Laboratory workup shows mild hyponatremia which is not significantly changed from baseline. Lipase is normal. She is sent for CT of abdomen and pelvis which shows questionable colitis, also questionable polyp in the sigmoid colon. Patient is advised of these  findings. She is discharged with prescriptions for ciprofloxacin and metronidazole, referred to gastroenterology for colonoscopy to evaluate her possible polyp.  Final Clinical Impressions(s) / ED Diagnoses   Final diagnoses:  Chronic abdominal pain  Colitis  Hyponatremia    New Prescriptions New Prescriptions   CIPROFLOXACIN (CIPRO) 500 MG TABLET    Take 1 tablet (500 mg total) by mouth 2 (two) times daily.   METRONIDAZOLE (FLAGYL) 500 MG TABLET    Take 1 tablet (500 mg total) by mouth 3 (three) times daily.     Delora Fuel, MD 71/06/26 [phone]

## 2016-10-12 NOTE — ED Notes (Signed)
Bed: WA11 Expected date:  Expected time:  Means of arrival:  Comments: 

## 2016-10-12 NOTE — ED Notes (Signed)
Bed: DT26 Expected date:  Expected time:  Means of arrival:  Comments: EMS-fall/hematoma

## 2016-10-12 NOTE — ED Notes (Signed)
Patient stating "I cannot pee right now because I just peed before I got here".

## 2016-10-12 NOTE — ED Triage Notes (Signed)
Pt's husband reports pt has been having abd pain with n/v/d x 3 weeks.  Was seen here in the ED for same x 2 nights ago and was discharged home.  He reports pt has not been eating or drinking.  Has constant diarrhea, which appears "black."  Pt is constantly moaning and groaning d/t pain. Pt is A&Ox.4

## 2016-10-12 NOTE — Discharge Instructions (Signed)
Your CT scan showed a possible polyp (growth) in your colon. Please call the gastroenterologist to set up a colonoscopy. With a colonoscopy, they can find out if the polyp is really there, and remove it to see if it is cancer.

## 2016-10-12 NOTE — ED Notes (Signed)
At this time PT unable to provide urine sample

## 2016-10-30 ENCOUNTER — Emergency Department (HOSPITAL_COMMUNITY)
Admission: EM | Admit: 2016-10-30 | Discharge: 2016-10-30 | Discharge status: Against Medical Advice | Payer: Medicaid Other | Attending: Emergency Medicine | Admitting: Emergency Medicine

## 2016-10-30 ENCOUNTER — Encounter (HOSPITAL_COMMUNITY): Payer: Self-pay | Admitting: Emergency Medicine

## 2016-10-30 DIAGNOSIS — R0602 Shortness of breath: Secondary | ICD-10-CM | POA: Insufficient documentation

## 2016-10-30 DIAGNOSIS — R1013 Epigastric pain: Secondary | ICD-10-CM | POA: Diagnosis present

## 2016-10-30 DIAGNOSIS — Z85528 Personal history of other malignant neoplasm of kidney: Secondary | ICD-10-CM | POA: Insufficient documentation

## 2016-10-30 DIAGNOSIS — Z79899 Other long term (current) drug therapy: Secondary | ICD-10-CM | POA: Diagnosis not present

## 2016-10-30 DIAGNOSIS — Z5320 Procedure and treatment not carried out because of patient's decision for unspecified reasons: Secondary | ICD-10-CM | POA: Insufficient documentation

## 2016-10-30 DIAGNOSIS — R42 Dizziness and giddiness: Secondary | ICD-10-CM | POA: Diagnosis not present

## 2016-10-30 DIAGNOSIS — J449 Chronic obstructive pulmonary disease, unspecified: Secondary | ICD-10-CM | POA: Diagnosis not present

## 2016-10-30 DIAGNOSIS — I10 Essential (primary) hypertension: Secondary | ICD-10-CM | POA: Diagnosis not present

## 2016-10-30 DIAGNOSIS — R112 Nausea with vomiting, unspecified: Secondary | ICD-10-CM | POA: Diagnosis not present

## 2016-10-30 DIAGNOSIS — F17210 Nicotine dependence, cigarettes, uncomplicated: Secondary | ICD-10-CM | POA: Insufficient documentation

## 2016-10-30 LAB — CBC
HEMATOCRIT: 37.2 % (ref 36.0–46.0)
HEMOGLOBIN: 12.9 g/dL (ref 12.0–15.0)
MCH: 31 pg (ref 26.0–34.0)
MCHC: 34.7 g/dL (ref 30.0–36.0)
MCV: 89.4 fL (ref 78.0–100.0)
Platelets: 233 10*3/uL (ref 150–400)
RBC: 4.16 MIL/uL (ref 3.87–5.11)
RDW: 13.8 % (ref 11.5–15.5)
WBC: 7.3 10*3/uL (ref 4.0–10.5)

## 2016-10-30 LAB — COMPREHENSIVE METABOLIC PANEL
ALK PHOS: 96 U/L (ref 38–126)
ALT: 9 U/L — ABNORMAL LOW (ref 14–54)
ANION GAP: 13 (ref 5–15)
AST: 18 U/L (ref 15–41)
Albumin: 4.2 g/dL (ref 3.5–5.0)
BUN: 8 mg/dL (ref 6–20)
CHLORIDE: 97 mmol/L — AB (ref 101–111)
CO2: 21 mmol/L — AB (ref 22–32)
Calcium: 9.5 mg/dL (ref 8.9–10.3)
Creatinine, Ser: 0.81 mg/dL (ref 0.44–1.00)
GFR calc Af Amer: >60 mL/min (ref >60)
GFR calc non Af Amer: >60 mL/min (ref >60)
GLUCOSE: 94 mg/dL (ref 65–99)
POTASSIUM: 4 mmol/L (ref 3.5–5.1)
Sodium: 131 mmol/L — ABNORMAL LOW (ref 135–145)
Total Bilirubin: 0.6 mg/dL (ref 0.3–1.2)
Total Protein: 7.6 g/dL (ref 6.5–8.1)

## 2016-10-30 LAB — LIPASE, BLOOD: Lipase: 20 U/L (ref 11–51)

## 2016-10-30 LAB — I-STAT TROPONIN, ED: Troponin i, poc: 0 ng/mL (ref 0.00–0.08)

## 2016-10-30 MED ORDER — PANTOPRAZOLE SODIUM 40 MG PO TBEC
40.0000 mg | DELAYED_RELEASE_TABLET | Freq: Once | ORAL | Status: AC
  Administered 2016-10-30: 40 mg via ORAL
  Filled 2016-10-30: qty 1

## 2016-10-30 MED ORDER — METOCLOPRAMIDE HCL 5 MG/ML IJ SOLN
10.0000 mg | Freq: Once | INTRAMUSCULAR | Status: DC
  Filled 2016-10-30: qty 2

## 2016-10-30 MED ORDER — ONDANSETRON 4 MG PO TBDP
4.0000 mg | ORAL_TABLET | Freq: Once | ORAL | Status: AC | PRN
  Administered 2016-10-30: 4 mg via ORAL
  Filled 2016-10-30: qty 1

## 2016-10-30 MED ORDER — HYDROMORPHONE HCL 1 MG/ML IJ SOLN
1.0000 mg | Freq: Once | INTRAMUSCULAR | Status: AC
  Administered 2016-10-30: 1 mg via INTRAMUSCULAR
  Filled 2016-10-30: qty 1

## 2016-10-30 MED ORDER — ONDANSETRON 8 MG PO TBDP
8.0000 mg | ORAL_TABLET | Freq: Once | ORAL | Status: AC
  Administered 2016-10-30: 8 mg via ORAL
  Filled 2016-10-30: qty 1

## 2016-10-30 MED ORDER — FENTANYL CITRATE (PF) 100 MCG/2ML IJ SOLN
50.0000 ug | Freq: Once | INTRAMUSCULAR | Status: AC
  Administered 2016-10-30: 50 ug via INTRAVENOUS
  Filled 2016-10-30: qty 2

## 2016-10-30 NOTE — ED Notes (Addendum)
Pt. Requested to sit out in the lobby to wait due to being closed in the room. Pt. Needs to be on oxygen while waiting but refuses.  Nurses aware.

## 2016-10-30 NOTE — ED Notes (Signed)
Pt stated " you can not get an IV on me, I have no veins" Writer spoke with PA who changed orders. Pt given 2 warm blankets.

## 2016-10-30 NOTE — ED Provider Notes (Signed)
[facility] DEPT Provider Note   CSN: [id number] Arrival date & time: 10/30/16  1049     History   Chief Complaint Chief Complaint  Patient presents with  . Abdominal Pain    HPI [name] is a 66 y.o. female.  HPI   Patient is a 66 year old female with a history of renal cell carcinoma, COPD, chronic pancreatitis, chronic abdominal pain, and abdominal surgeries including partial gastrectomy for ulcers and partial nephrectomy presenting for epigastric pain times 3 days.  Patient reports it worsened today prompting evaluation.  Patient reports that she has some radiation of the pain into her back.  Patient reports that this episode is similar to her prior episodes of exacerbations of her chronic pancreatitis pain.  Patient reports she has vomited 2 times since the onset of the pain, which was dark in color, but no bright red blood and no coffee-ground appearance.  Patient reports she had a normal bowel movement this morning without melena or hematochezia.  No diarrhea.  No dysuria.  No lower abdominal pain or cramping, vaginal discharge, or vaginal bleeding.  Patient has tried home Vicodin prescription for her symptoms, but it did not relieve the pain.  Patient  reports she is supposed to be on PPI therapy, however has not picked up the prescription.  Patient was evaluated 2-3 weeks ago on 10-12-2016 for diarrhea and abdominal pain, and CT revealed possible colitis.  Patient treated with Cipro Flagyl, however she reports she did not take this, and is a symptomatic of colitis symptoms at this time.  Past Medical History:  Diagnosis Date  . Cancer ([place])    renal ca  . COPD (chronic obstructive pulmonary disease) ([place])   . Pancreatitis   . Pancreatitis   . Seizures ([place])   . Substance abuse [facility])     Patient Active Problem List   Diagnosis Date Noted  . Hip fracture, right ([place]) 06/02/2016  . Narcotic addiction ([place]) 02/29/2016  . Abdominal pain,  chronic, epigastric- due to chronic pancreatitis 02/29/2016  . COPD (chronic obstructive pulmonary disease) ([place]) 02/28/2016  . Cough 09/03/2015  . Abdominal pain 01/03/2015  . Hyponatremia 01/03/2015  . Hypocalcemia 01/03/2015  . Underweight 01/03/2015  . COPD GOLD III with reversible component  07/17/2014  . DTs (delirium tremens) ([place]) 06/02/2013  . Chronic alcoholic pancreatitis ([place]) 06/02/2013  . Lactic acidosis 06/02/2013  . Recurrent acute pancreatitis 03/28/2013  . Alcohol withdrawal ([place]) 03/28/2013  . Pancreatitis 03/09/2013  . Tobacco abuse 03/09/2013  . Alcohol withdrawal syndrome with perceptual disturbance ([place]) 03/09/2013  . Acute alcoholic pancreatitis 09/73/5329  . Benzodiazepine withdrawal ([place]) 02/09/2013  . Protein-calorie malnutrition, severe ([place]) 02/09/2013  . Hypokalemia 02/27/2011  . Abdominal pain, acute 02/25/2011  . Nausea and vomiting 02/25/2011  . Thrombocytopenia ([place]) 02/25/2011  . COPD with acute exacerbation ([place]) 02/25/2011  . HTN (hypertension), malignant 02/25/2011  . KIDNEY CANCER 10/18/2008  . Anxiety state 10/18/2008  . GERD 10/18/2008  . CIRRHOSIS 10/18/2008  . PANCREATITIS, CHRONIC- atrophic pancreas 10/18/2008  . DYSPNEA 10/18/2008  . GASTRIC ULCER, HX OF 10/18/2008    Past Surgical History:  Procedure Laterality Date  . IR GENERIC HISTORICAL  05/08/2015   IR RADIOLOGIST EVAL & MGMT 05/08/2015 Aletta Edouard, MD [facility] INTERV RAD  . IR GENERIC HISTORICAL  01/08/2014   IR RADIOLOGIST EVAL & MGMT 01/08/2014 Aletta Edouard, MD [facility] INTERV RAD  . KIDNEY SURGERY     removed cancerous lesions  . PARTIAL GASTRECTOMY  OB History    No data available       Home Medications    Prior to Admission medications   Medication Sig Start Date End Date Taking? Authorizing Provider  albuterol (PROVENTIL HFA;VENTOLIN HFA) 108 (90 Base) MCG/ACT inhaler Inhale 2 puffs into the lungs every 6 (six) hours as needed for wheezing or shortness of  breath. 02/27/15   Juanito Doom, MD  ciprofloxacin (CIPRO) 500 MG tablet Take 1 tablet (500 mg total) by mouth 2 (two) times daily. 88/4/16   Delora Fuel, MD  CREON 12000 units CPEP capsule Take 12,000 Units by mouth 3 (three) times daily with meals. 08/16/16   [provider]  DULERA 200-5 MCG/ACT AERO INHALE 2 PUFFS INTO THE LUNGS TWICE DAILY Patient taking differently: INHALE 2 PUFFS INTO THE LUNGS TWICE DAILY prn for wheezing 12/01/15   Juanito Doom, MD  HYDROmorphone (DILAUDID) 2 MG tablet TK 1 Tabley by mouth every 6 Hours as needed for pain 06/10/16   [provider]  ipratropium-albuterol (DUONEB) 0.5-2.5 (3) MG/3ML SOLN Take 3 mLs by nebulization every 6 (six) hours as needed (dyspnea not releived by inhaler). 02/29/16   Debbe Odea, MD  LORazepam (ATIVAN) 1 MG tablet Take 1 tablet (1 mg total) by mouth every 6 (six) hours as needed. For anxiety. Patient taking differently: Take 1 mg by mouth every 6 (six) hours as needed for anxiety. For anxiety. 04/01/13   Isaac Bliss, Rayford Halsted, MD  losartan (COZAAR) 100 MG tablet Take 1 tablet (100 mg total) by mouth daily. 09/03/15   Parrett, Fonnie Mu, NP  metroNIDAZOLE (FLAGYL) 500 MG tablet Take 1 tablet (500 mg total) by mouth 3 (three) times daily. 60/6/30   Delora Fuel, MD  ondansetron (ZOFRAN ODT) 4 MG disintegrating tablet Take 1 tablet (4 mg total) by mouth every 8 (eight) hours as needed for nausea or vomiting. 08/10/16   Duffy Bruce, MD  SPIRIVA HANDIHALER 18 MCG inhalation capsule INHALE 1 CAPSULE VIA HANDIHALER ONCE DAILY 11/07/15   Juanito Doom, MD    Family History Family History  Problem Relation Age of Onset  . CAD Mother   . Alcoholism Father   . COPD Father     Social History Social History  Substance Use Topics  . Smoking status: Current Every Day Smoker    Packs/day: 1.00    Years: 30.00    Types: Cigarettes  . Smokeless tobacco: Never Used     Comment: smoking up to 1.5 ppd per  husband.   . Alcohol use 0.0 oz/week     Allergies   Aspirin and Chlorpromazine hcl   Review of Systems Review of Systems  Constitutional: Negative for chills and fever.  HENT: Negative for congestion, rhinorrhea and sore throat.   Eyes: Negative for visual disturbance.  Respiratory: Positive for shortness of breath. Negative for cough and chest tightness.        Shortness of breath chronic for patient.  Cardiovascular: Negative for chest pain and leg swelling.  Gastrointestinal: Positive for abdominal pain, nausea and vomiting. Negative for blood in stool, constipation and diarrhea.  Genitourinary: Negative for dysuria, flank pain, vaginal bleeding and vaginal discharge.  Musculoskeletal: Negative for back pain and myalgias.  Skin: Negative for rash.  Neurological: Positive for light-headedness. Negative for dizziness, syncope and headaches.     Physical Exam Updated Vital Signs BP (!) 143/90 (BP Location: Left Arm)   Pulse 95   Temp 98.6 F (37 C) (Oral)   Resp  14   SpO2 100%   Physical Exam  Constitutional:  Cachectic appearing female  appearing older than stated age and in moderate distress.  HENT:  Head: Normocephalic and atraumatic.  Mouth/Throat: Oropharynx is clear and moist.  Eyes: Pupils are equal, round, and reactive to light. Conjunctivae and EOM are normal.  Neck: Normal range of motion. Neck supple.  Cardiovascular: Normal rate, regular rhythm, S1 normal and S2 normal.   No murmur heard. Pulmonary/Chest: Effort normal and breath sounds normal. She has no wheezes. She has no rales.  Abdominal: Soft. Bowel sounds are normal. She exhibits no distension. There is tenderness. There is no rebound and no guarding.  Tender to palpation inferior to  umbilicus, and in epigastrium.  No guarding.  No rebound.  Musculoskeletal: Normal range of motion. She exhibits no edema or deformity.  Lymphadenopathy:    She has no cervical adenopathy.  Neurological: She is  alert.  Cranial nerves grossly intact. Patient was extremities with good coordination and symmetrically.  Skin: Skin is warm and dry. No rash noted. She is not diaphoretic. No erythema.  Psychiatric: She has a normal mood and affect. Her behavior is normal. Judgment and thought content normal.  Nursing note and vitals reviewed.    ED Treatments / Results  Labs (all labs ordered are listed, but only abnormal results are displayed) Labs Reviewed  COMPREHENSIVE METABOLIC PANEL - Abnormal; Notable for the following:       Result Value   Sodium 131 (*)    Chloride 97 (*)    CO2 21 (*)    ALT 9 (*)    All other components within normal limits  LIPASE, BLOOD  CBC  URINALYSIS, ROUTINE W REFLEX MICROSCOPIC  I-STAT TROPONIN, ED    EKG  EKG Interpretation None       Radiology No results found.  Procedures Procedures (including critical care time)  Medications Ordered in ED Medications  metoCLOPramide (REGLAN) injection 10 mg (not administered)  fentaNYL (SUBLIMAZE) injection 50 mcg (not administered)  ondansetron (ZOFRAN-ODT) disintegrating tablet 4 mg (4 mg Oral Given 10/30/16 1225)     Initial Impression / Assessment and Plan / ED Course  I have reviewed the triage vital signs and the nursing notes.  Pertinent labs & imaging results that were available during my care of the patient were reviewed by me and considered in my medical decision making (see chart for details).  Clinical Course as of Oct 30 1512  Sat Oct 30, 2016  1440 Lipase: 20 [AM]    Clinical Course User Index [AM] Albesa Seen, PA-C    Final Clinical Impressions(s) / ED Diagnoses   Final diagnoses:  None   Patient is afebrile, and in moderate distress.  Differential diagnosis includes acute exacerbation of chronic pancreatitis, colitis, gastritis, peptic ulcer disease.  Will prescribe pain control, and antinausea medicine and reassess.  Patient likely to require proton pump inhibitor  therapy.  Patient refused urinalysis and further labs.  After IM fentanyl and additional dose of IM Dilaudid, patient became upset regarding not being able to eat, and eloped out of the department per nursing.  Patient was ambulate well at this time, well-appearing, and actively requesting p.o. intake.  Do not suspect acute abdominal process at this time.  Do not suspect perforated viscus due to ulceration.  Patient has multiple home pain management regimens for her symptoms.  Dr. Vallery Ridge made aware of elopement.  This is a shared visit with Dr. Charlesetta Shanks. Patient was independently  evaluated by this attending physician. Attending physician consulted in evaluation and discharge management.  New Prescriptions New Prescriptions   No medications on file     Tamala Julian 10/30/16 1715    Charlesetta Shanks, MD 10/30/16 1750

## 2016-10-30 NOTE — ED Notes (Signed)
Patient stated her husband was here to pick her up and she didn't have time to sign out AMA-refused AMA discharge process

## 2016-10-30 NOTE — ED Provider Notes (Signed)
Medical screening examination/treatment/procedure(s) were conducted as a shared visit with non-physician practitioner(s) and myself.  I personally evaluated the patient during the encounter.   EKG Interpretation None     Patient has history of chronic abdominal pain with history of chronic pancreatitis.  Patient reports she has had epigastric pain and vomiting. Is alert and nontoxic.  Heart is regular.  EpiGastric pain to palpation but no guarding. At this time findings most suggestive of patient's chronic pain.  Will treat in the emergency department with plan for discharge without repeat CT scan.  Agree with plan of management.   Charlesetta Shanks, MD 10/30/16 [phone]

## 2016-10-30 NOTE — ED Notes (Signed)
Pt advised of U/A needed per MD order. Pt states urinated in ED lobby/prior to notice of order,will attempt later. Specimen cup at bedside. [employer]

## 2016-10-30 NOTE — ED Notes (Signed)
Pt refusing EKG

## 2016-10-30 NOTE — ED Triage Notes (Signed)
Per EMS-states left lower abdominal pain for a few days-states vomited once yesterday and nauseous today-history of pancreatitis-denies ETOH with EMS-was seen for same symptoms on the 9 th

## 2016-10-30 NOTE — ED Notes (Signed)
Pt left room and walked out of department. Pt was asked to stop and sign an AMA form, she refused and kept walking. Pt noted to be in lobby talking on phone.

## 2016-11-07 ENCOUNTER — Emergency Department (HOSPITAL_COMMUNITY)
Admission: EM | Admit: 2016-11-07 | Discharge: 2016-11-07 | Discharge status: Against Medical Advice | Payer: Medicaid Other | Attending: Emergency Medicine | Admitting: Emergency Medicine

## 2016-11-07 ENCOUNTER — Encounter (HOSPITAL_COMMUNITY): Payer: Self-pay

## 2016-11-07 DIAGNOSIS — R1013 Epigastric pain: Secondary | ICD-10-CM | POA: Diagnosis present

## 2016-11-07 DIAGNOSIS — J449 Chronic obstructive pulmonary disease, unspecified: Secondary | ICD-10-CM | POA: Insufficient documentation

## 2016-11-07 DIAGNOSIS — I10 Essential (primary) hypertension: Secondary | ICD-10-CM | POA: Insufficient documentation

## 2016-11-07 DIAGNOSIS — Z85528 Personal history of other malignant neoplasm of kidney: Secondary | ICD-10-CM | POA: Insufficient documentation

## 2016-11-07 DIAGNOSIS — G8929 Other chronic pain: Secondary | ICD-10-CM | POA: Diagnosis not present

## 2016-11-07 DIAGNOSIS — R109 Unspecified abdominal pain: Secondary | ICD-10-CM

## 2016-11-07 DIAGNOSIS — Z79899 Other long term (current) drug therapy: Secondary | ICD-10-CM | POA: Diagnosis not present

## 2016-11-07 DIAGNOSIS — F17210 Nicotine dependence, cigarettes, uncomplicated: Secondary | ICD-10-CM | POA: Diagnosis not present

## 2016-11-07 LAB — COMPREHENSIVE METABOLIC PANEL
ALT: 9 U/L — ABNORMAL LOW (ref 14–54)
AST: 16 U/L (ref 15–41)
Albumin: 4 g/dL (ref 3.5–5.0)
Alkaline Phosphatase: 83 U/L (ref 38–126)
Anion gap: 9 (ref 5–15)
BUN: 8 mg/dL (ref 6–20)
CHLORIDE: 99 mmol/L — AB (ref 101–111)
CO2: 25 mmol/L (ref 22–32)
Calcium: 9.2 mg/dL (ref 8.9–10.3)
Creatinine, Ser: 0.8 mg/dL (ref 0.44–1.00)
GFR calc Af Amer: >60 mL/min (ref >60)
GFR calc non Af Amer: >60 mL/min (ref >60)
GLUCOSE: 83 mg/dL (ref 65–99)
POTASSIUM: 3.5 mmol/L (ref 3.5–5.1)
Sodium: 133 mmol/L — ABNORMAL LOW (ref 135–145)
Total Bilirubin: 0.8 mg/dL (ref 0.3–1.2)
Total Protein: 6.9 g/dL (ref 6.5–8.1)

## 2016-11-07 LAB — LIPASE, BLOOD: LIPASE: 19 U/L (ref 11–51)

## 2016-11-07 LAB — CBC WITH DIFFERENTIAL/PLATELET
Basophils Absolute: 0 10*3/uL (ref 0.0–0.1)
Basophils Relative: 1 %
EOS ABS: 0.1 10*3/uL (ref 0.0–0.7)
EOS PCT: 2 %
HCT: 33.8 % — ABNORMAL LOW (ref 36.0–46.0)
Hemoglobin: 11.5 g/dL — ABNORMAL LOW (ref 12.0–15.0)
LYMPHS PCT: 29 %
Lymphs Abs: 2.1 10*3/uL (ref 0.7–4.0)
MCH: 29.9 pg (ref 26.0–34.0)
MCHC: 34 g/dL (ref 30.0–36.0)
MCV: 88 fL (ref 78.0–100.0)
Monocytes Absolute: 0.7 10*3/uL (ref 0.1–1.0)
Monocytes Relative: 9 %
NEUTROS PCT: 59 %
Neutro Abs: 4.2 10*3/uL (ref 1.7–7.7)
PLATELETS: 173 10*3/uL (ref 150–400)
RBC: 3.84 MIL/uL — AB (ref 3.87–5.11)
RDW: 13.5 % (ref 11.5–15.5)
WBC: 7 10*3/uL (ref 4.0–10.5)

## 2016-11-07 MED ORDER — MORPHINE SULFATE (PF) 4 MG/ML IV SOLN
4.0000 mg | Freq: Once | INTRAVENOUS | Status: DC
  Filled 2016-11-07: qty 1

## 2016-11-07 MED ORDER — DICYCLOMINE HCL 10 MG/ML IM SOLN
20.0000 mg | Freq: Once | INTRAMUSCULAR | Status: AC
  Administered 2016-11-07: 20 mg via INTRAMUSCULAR
  Filled 2016-11-07 (×2): qty 2

## 2016-11-07 MED ORDER — SODIUM CHLORIDE 0.9 % IV BOLUS (SEPSIS): 1000.0000 mL | Freq: Once | INTRAVENOUS | Status: DC

## 2016-11-07 MED ORDER — ONDANSETRON 4 MG PO TBDP
4.0000 mg | ORAL_TABLET | Freq: Once | ORAL | Status: DC
  Filled 2016-11-07: qty 1

## 2016-11-07 NOTE — ED Notes (Signed)
Attempted lab draw x 2 but unsuccessful. 

## 2016-11-07 NOTE — ED Provider Notes (Signed)
[facility] DEPT Provider Note   CSN: [id number] Arrival date & time: 11/07/16  1301     History   Chief Complaint Chief Complaint  Patient presents with  . Abdominal Pain    HPI [name] is a 66 y.o. female w PMHx chronic pancreatitis, substance abuse, COPD, renal cancer, gastric ulcers, anxiety, presenting to the ED with epigastric abdominal pain with associated nausea.  Patient states pain is typical for her chronic pain, however she is unable to find enough relief with Vicodin and OTC medications for pain.  She states she has not had any vomiting, and minimal diarrhea.  Decreased appetite. She states she is here for pain relief.   Denies fever or chills, chest pain, urinary symptoms, or other complaints.  Per chart review, pt with multiple ED visits for chronic abdominal pain. CT abd done on 10/12/16 with mild colitis and treated.  Pt in pain management with prescribed narcotics for chronic pain.  The history is provided by the patient.    Past Medical History:  Diagnosis Date  . Cancer ([place])    renal ca  . COPD (chronic obstructive pulmonary disease) ([place])   . Pancreatitis   . Pancreatitis   . Seizures ([place])   . Substance abuse [facility])     Patient Active Problem List   Diagnosis Date Noted  . Hip fracture, right ([place]) 06/02/2016  . Narcotic addiction ([place]) 02/29/2016  . Abdominal pain, chronic, epigastric- due to chronic pancreatitis 02/29/2016  . COPD (chronic obstructive pulmonary disease) ([place]) 02/28/2016  . Cough 09/03/2015  . Abdominal pain 01/03/2015  . Hyponatremia 01/03/2015  . Hypocalcemia 01/03/2015  . Underweight 01/03/2015  . COPD GOLD III with reversible component  07/17/2014  . DTs (delirium tremens) ([place]) 06/02/2013  . Chronic alcoholic pancreatitis ([place]) 06/02/2013  . Lactic acidosis 06/02/2013  . Recurrent acute pancreatitis 03/28/2013  . Alcohol withdrawal ([place]) 03/28/2013  . Pancreatitis 03/09/2013  .  Tobacco abuse 03/09/2013  . Alcohol withdrawal syndrome with perceptual disturbance ([place]) 03/09/2013  . Acute alcoholic pancreatitis 45/36/4680  . Benzodiazepine withdrawal ([place]) 02/09/2013  . Protein-calorie malnutrition, severe ([place]) 02/09/2013  . Hypokalemia 02/27/2011  . Abdominal pain, acute 02/25/2011  . Nausea and vomiting 02/25/2011  . Thrombocytopenia ([place]) 02/25/2011  . COPD with acute exacerbation ([place]) 02/25/2011  . HTN (hypertension), malignant 02/25/2011  . KIDNEY CANCER 10/18/2008  . Anxiety state 10/18/2008  . GERD 10/18/2008  . CIRRHOSIS 10/18/2008  . PANCREATITIS, CHRONIC- atrophic pancreas 10/18/2008  . DYSPNEA 10/18/2008  . GASTRIC ULCER, HX OF 10/18/2008    Past Surgical History:  Procedure Laterality Date  . IR GENERIC HISTORICAL  05/08/2015   IR RADIOLOGIST EVAL & MGMT 05/08/2015 Aletta Edouard, MD [facility] INTERV RAD  . IR GENERIC HISTORICAL  01/08/2014   IR RADIOLOGIST EVAL & MGMT 01/08/2014 Aletta Edouard, MD [facility] INTERV RAD  . KIDNEY SURGERY     removed cancerous lesions  . PARTIAL GASTRECTOMY      OB History    No data available       Home Medications    Prior to Admission medications   Medication Sig Start Date End Date Taking? Authorizing Provider  albuterol (PROVENTIL HFA;VENTOLIN HFA) 108 (90 Base) MCG/ACT inhaler Inhale 2 puffs into the lungs every 6 (six) hours as needed for wheezing or shortness of breath. 02/27/15  Yes Juanito Doom, MD  ciprofloxacin (CIPRO) 500 MG tablet Take 1 tablet (500 mg total) by mouth 2 (two) times daily. 10/12/16  Yes  Delora Fuel, MD  CREON 12000 units CPEP capsule Take 12,000 Units by mouth 3 (three) times daily with meals. 08/16/16  Yes [provider]  [place] 200-5 MCG/ACT AERO INHALE 2 PUFFS INTO THE LUNGS TWICE DAILY Patient taking differently: INHALE 2 PUFFS INTO THE LUNGS TWICE DAILY prn for wheezing 12/01/15  Yes Juanito Doom, MD  HYDROmorphone (DILAUDID) 2 MG tablet TK 1 Tabley by mouth  every 6 Hours as needed for pain 06/10/16  Yes [provider]  ipratropium-albuterol (DUONEB) 0.5-2.5 (3) MG/3ML SOLN Take 3 mLs by nebulization every 6 (six) hours as needed (dyspnea not releived by inhaler). 02/29/16  Yes Debbe Odea, MD  LORazepam (ATIVAN) 1 MG tablet Take 1 tablet (1 mg total) by mouth every 6 (six) hours as needed. For anxiety. Patient taking differently: Take 1 mg by mouth every 6 (six) hours as needed for anxiety. For anxiety. 04/01/13  Yes Isaac Bliss, Rayford Halsted, MD  losartan (COZAAR) 100 MG tablet Take 1 tablet (100 mg total) by mouth daily. 09/03/15  Yes Parrett, Tammy S, NP  metroNIDAZOLE (FLAGYL) 500 MG tablet Take 1 tablet (500 mg total) by mouth 3 (three) times daily. 88/5/02  Yes Delora Fuel, MD  ondansetron (ZOFRAN ODT) 4 MG disintegrating tablet Take 1 tablet (4 mg total) by mouth every 8 (eight) hours as needed for nausea or vomiting. 08/10/16  Yes Duffy Bruce, MD  SPIRIVA HANDIHALER 18 MCG inhalation capsule INHALE 1 CAPSULE VIA HANDIHALER ONCE DAILY 11/07/15  Yes Juanito Doom, MD    Family History Family History  Problem Relation Age of Onset  . CAD Mother   . Alcoholism Father   . COPD Father     Social History Social History   Tobacco Use  . Smoking status: Current Every Day Smoker    Packs/day: 1.00    Years: 30.00    Pack years: 30.00    Types: Cigarettes  . Smokeless tobacco: Never Used  . Tobacco comment: smoking up to 1.5 ppd per husband.   Substance Use Topics  . Alcohol use: Yes    Alcohol/week: 0.0 oz  . Drug use: No    Comment: Hx of polysubstance drug abuse     Allergies   Aspirin and Chlorpromazine hcl   Review of Systems Review of Systems   Physical Exam Updated Vital Signs BP (!) 125/97 (BP Location: Right Arm)   Pulse 88   Temp 98.2 F (36.8 C) (Oral)   Resp 20   SpO2 100%   Physical Exam  Constitutional: She appears well-developed. She appears ill (chronically ill-appearing.).  Upon  entering the room, patient is resting comfortably and quietly in bed, however becomes hysterical upon entering.   HENT:  Head: Normocephalic and atraumatic.  Mouth/Throat: Oropharynx is clear and moist.  Eyes: Conjunctivae are normal.  Cardiovascular: Normal rate, regular rhythm and normal heart sounds.  Pulmonary/Chest: Effort normal and breath sounds normal.  Abdominal: Soft. Bowel sounds are normal. There is tenderness in the epigastric area. There is no rigidity and no guarding.  Old ventral surgical scar, no palpated hernia, however appears to have diastasis recti  Neurological: She is alert.  Skin: Skin is warm.  Psychiatric: She has a normal mood and affect.  Nursing note and vitals reviewed.    ED Treatments / Results  Labs (all labs ordered are listed, but only abnormal results are displayed) Labs Reviewed  CBC WITH DIFFERENTIAL/PLATELET - Abnormal; Notable for the following components:      Result Value  RBC 3.84 (*)    Hemoglobin 11.5 (*)    HCT 33.8 (*)    All other components within normal limits  COMPREHENSIVE METABOLIC PANEL - Abnormal; Notable for the following components:   Sodium 133 (*)    Chloride 99 (*)    ALT 9 (*)    All other components within normal limits  LIPASE, BLOOD    EKG  EKG Interpretation None       Radiology No results found.  Procedures Procedures (including critical care time)  Medications Ordered in ED Medications  dicyclomine (BENTYL) injection 20 mg (20 mg Intramuscular Given 11/07/16 1556)     Initial Impression / Assessment and Plan / ED Course  I have reviewed the triage vital signs and the nursing notes.  Pertinent labs & imaging results that were available during my care of the patient were reviewed by me and considered in my medical decision making (see chart for details).     Pt presenting with chronic abdominal pain.  She is chronically ill-appearing, however does not appear toxic or septic. Patient resting  comfortably and soundly in bed until provider or nursing enter the room when she becomes hysterical.  Abdomen without peritoneal signs or guarding.  Patient difficult to obtain labs, however once obtained appear unremarkable. WBC wnl. Lipase wnl.  She is requesting narcotic pain medication, however given patient's history of ED visits with elopement after receiving narcotics, feel it is unnecessary at this time.  No evidence of acute pancreatitis or acute abdominal pathology.  Pt given Bentyl for symptoms. Pt refused IV for fluids. Patient does not meet the SIRS or Sepsis criteria.  Patient eloped prior to discharge, however no evidence of medical emergency at this time.  Patient discussed with Dr. Alvino Chapel who agrees with care plan.  Final Clinical Impressions(s) / ED Diagnoses   Final diagnoses:  Chronic abdominal pain    New Prescriptions This SmartLink is deprecated. Use AVSMEDLIST instead to display the medication list for a patient.   Yasir Kitner, [place] N, PA-C 11/07/16 [place], Nathan, MD 11/22/16 [phone]

## 2016-11-07 NOTE — ED Notes (Signed)
Bed: WA09 Expected date:  Expected time:  Means of arrival:  Comments: 66 yo abd pain

## 2016-11-07 NOTE — ED Triage Notes (Signed)
Per EMS, pt from home.  Pt c/o abdominal pain x 2 weeks.  Seen recently for same.  Vitals: 154 cbg, 154/90, hr 86, resp 20

## 2016-11-07 NOTE — ED Notes (Signed)
Attempted to obtain labs, Pt refuses to let me advance the needle.

## 2016-11-07 NOTE — ED Notes (Signed)
Spoke with pt regarding IV start and blood draw.  Pt wants IM injection for her pain meds and does not want stuck.  Screams that she has been told she can have her IM and does not want an IV.  Explained need for labs.  Will notify provider

## 2016-11-13 ENCOUNTER — Encounter (HOSPITAL_COMMUNITY): Payer: Self-pay | Admitting: Emergency Medicine

## 2016-11-13 ENCOUNTER — Emergency Department (HOSPITAL_COMMUNITY)
Admission: EM | Admit: 2016-11-13 | Discharge: 2016-11-14 | Discharge status: Against Medical Advice | Payer: Medicaid Other | Attending: Emergency Medicine | Admitting: Emergency Medicine

## 2016-11-13 ENCOUNTER — Other Ambulatory Visit: Payer: Self-pay

## 2016-11-13 DIAGNOSIS — I10 Essential (primary) hypertension: Secondary | ICD-10-CM | POA: Diagnosis not present

## 2016-11-13 DIAGNOSIS — M25551 Pain in right hip: Secondary | ICD-10-CM | POA: Diagnosis not present

## 2016-11-13 DIAGNOSIS — J449 Chronic obstructive pulmonary disease, unspecified: Secondary | ICD-10-CM | POA: Diagnosis not present

## 2016-11-13 DIAGNOSIS — Z79899 Other long term (current) drug therapy: Secondary | ICD-10-CM | POA: Insufficient documentation

## 2016-11-13 DIAGNOSIS — R109 Unspecified abdominal pain: Secondary | ICD-10-CM | POA: Insufficient documentation

## 2016-11-13 DIAGNOSIS — F17210 Nicotine dependence, cigarettes, uncomplicated: Secondary | ICD-10-CM | POA: Insufficient documentation

## 2016-11-13 DIAGNOSIS — W06XXXA Fall from bed, initial encounter: Secondary | ICD-10-CM | POA: Insufficient documentation

## 2016-11-13 DIAGNOSIS — Z85528 Personal history of other malignant neoplasm of kidney: Secondary | ICD-10-CM | POA: Diagnosis not present

## 2016-11-13 DIAGNOSIS — R531 Weakness: Secondary | ICD-10-CM | POA: Diagnosis not present

## 2016-11-13 DIAGNOSIS — G8929 Other chronic pain: Secondary | ICD-10-CM | POA: Diagnosis not present

## 2016-11-13 DIAGNOSIS — W19XXXA Unspecified fall, initial encounter: Secondary | ICD-10-CM

## 2016-11-13 NOTE — ED Notes (Signed)
Bed: WA21 Expected date:  Expected time:  Means of arrival:  Comments: 66 yo F/Fall

## 2016-11-13 NOTE — ED Triage Notes (Signed)
Patient complaining of right hip pain. Patient had two falls earlier in the day. Patient states the pain was getting worse and called EMS. Patient also complaining of abdominal pain. Patient has a hx of pancreatitis.

## 2016-11-14 ENCOUNTER — Emergency Department (HOSPITAL_COMMUNITY): Payer: Medicaid Other

## 2016-11-14 MED ORDER — OXYCODONE-ACETAMINOPHEN 5-325 MG PO TABS
2.0000 | ORAL_TABLET | Freq: Once | ORAL | Status: AC
  Administered 2016-11-14: 2 via ORAL
  Filled 2016-11-14: qty 2

## 2016-11-14 NOTE — ED Notes (Signed)
Pt. Walked out of her room, staff assisted pt. Back to her bed/room but pt. Refused to go back and  stated that she is leaving now and her husband is going to pick her up. Dr. Dina Rich made aware. Pt. Ambulatory and signed AMA.

## 2016-11-14 NOTE — ED Notes (Signed)
Writer made two unsuccessful attempts to draw labs.

## 2016-11-14 NOTE — ED Provider Notes (Signed)
[facility] DEPT Provider Note   CSN: [id number] Arrival date & time: 11/13/16  2312     History   Chief Complaint Chief Complaint  Patient presents with  . Abdominal Pain  . Fall  . Hip Pain    HPI [name] is a 66 y.o. female.  HPI  This is a 66 year old female with a history of renal cancer, COPD, pancreatitis, polysubstance abuse who presents initially with a complaint of a fall.  Patient reports that she rolled out of bed multiple times today.  When asked why she rolled out of bed she states that she feels generally weak.  She denies loss of consciousness.  She reports some right hip pain from falling.  She reports recent right hip replacement.  Patient also reports diffuse abdominal pain.  She has a history of chronic abdominal pain.  States that this is the same.  Denies any vomiting but reports nausea.  In general she states that she does not eat very well.  No diarrhea.  No recent fevers.  Patient denies hitting her head or loss of consciousness.  She took her Dilaudid earlier today with no relief.  Past Medical History:  Diagnosis Date  . Cancer ([place])    renal ca  . COPD (chronic obstructive pulmonary disease) ([place])   . Pancreatitis   . Pancreatitis   . Seizures ([place])   . Substance abuse [facility])     Patient Active Problem List   Diagnosis Date Noted  . Hip fracture, right ([place]) 06/02/2016  . Narcotic addiction ([place]) 02/29/2016  . Abdominal pain, chronic, epigastric- due to chronic pancreatitis 02/29/2016  . COPD (chronic obstructive pulmonary disease) ([place]) 02/28/2016  . Cough 09/03/2015  . Abdominal pain 01/03/2015  . Hyponatremia 01/03/2015  . Hypocalcemia 01/03/2015  . Underweight 01/03/2015  . COPD GOLD III with reversible component  07/17/2014  . DTs (delirium tremens) ([place]) 06/02/2013  . Chronic alcoholic pancreatitis ([place]) 06/02/2013  . Lactic acidosis 06/02/2013  . Recurrent acute pancreatitis 03/28/2013  .  Alcohol withdrawal ([place]) 03/28/2013  . Pancreatitis 03/09/2013  . Tobacco abuse 03/09/2013  . Alcohol withdrawal syndrome with perceptual disturbance ([place]) 03/09/2013  . Acute alcoholic pancreatitis 02/77/4128  . Benzodiazepine withdrawal ([place]) 02/09/2013  . Protein-calorie malnutrition, severe ([place]) 02/09/2013  . Hypokalemia 02/27/2011  . Abdominal pain, acute 02/25/2011  . Nausea and vomiting 02/25/2011  . Thrombocytopenia ([place]) 02/25/2011  . COPD with acute exacerbation ([place]) 02/25/2011  . HTN (hypertension), malignant 02/25/2011  . KIDNEY CANCER 10/18/2008  . Anxiety state 10/18/2008  . GERD 10/18/2008  . CIRRHOSIS 10/18/2008  . PANCREATITIS, CHRONIC- atrophic pancreas 10/18/2008  . DYSPNEA 10/18/2008  . GASTRIC ULCER, HX OF 10/18/2008    Past Surgical History:  Procedure Laterality Date  . IR GENERIC HISTORICAL  05/08/2015   IR RADIOLOGIST EVAL & MGMT 05/08/2015 Aletta Edouard, MD [facility] INTERV RAD  . IR GENERIC HISTORICAL  01/08/2014   IR RADIOLOGIST EVAL & MGMT 01/08/2014 Aletta Edouard, MD [facility] INTERV RAD  . KIDNEY SURGERY     removed cancerous lesions  . PARTIAL GASTRECTOMY      OB History    No data available       Home Medications    Prior to Admission medications   Medication Sig Start Date End Date Taking? Authorizing Provider  albuterol (PROVENTIL HFA;VENTOLIN HFA) 108 (90 Base) MCG/ACT inhaler Inhale 2 puffs into the lungs every 6 (six) hours as needed for wheezing or shortness of breath. 02/27/15  Juanito Doom, MD  ciprofloxacin (CIPRO) 500 MG tablet Take 1 tablet (500 mg total) by mouth 2 (two) times daily. 17/4/94   Delora Fuel, MD  CREON 12000 units CPEP capsule Take 12,000 Units by mouth 3 (three) times daily with meals. 08/16/16   [provider]  DULERA 200-5 MCG/ACT AERO INHALE 2 PUFFS INTO THE LUNGS TWICE DAILY Patient taking differently: INHALE 2 PUFFS INTO THE LUNGS TWICE DAILY prn for wheezing 12/01/15   Juanito Doom, MD    HYDROmorphone (DILAUDID) 2 MG tablet TK 1 Tabley by mouth every 6 Hours as needed for pain 06/10/16   [provider]  ipratropium-albuterol (DUONEB) 0.5-2.5 (3) MG/3ML SOLN Take 3 mLs by nebulization every 6 (six) hours as needed (dyspnea not releived by inhaler). 02/29/16   Debbe Odea, MD  LORazepam (ATIVAN) 1 MG tablet Take 1 tablet (1 mg total) by mouth every 6 (six) hours as needed. For anxiety. Patient taking differently: Take 1 mg by mouth every 6 (six) hours as needed for anxiety. For anxiety. 04/01/13   Isaac Bliss, Rayford Halsted, MD  losartan (COZAAR) 100 MG tablet Take 1 tablet (100 mg total) by mouth daily. 09/03/15   Parrett, Fonnie Mu, NP  metroNIDAZOLE (FLAGYL) 500 MG tablet Take 1 tablet (500 mg total) by mouth 3 (three) times daily. 49/6/75   Delora Fuel, MD  ondansetron (ZOFRAN ODT) 4 MG disintegrating tablet Take 1 tablet (4 mg total) by mouth every 8 (eight) hours as needed for nausea or vomiting. 08/10/16   Duffy Bruce, MD  SPIRIVA HANDIHALER 18 MCG inhalation capsule INHALE 1 CAPSULE VIA HANDIHALER ONCE DAILY 11/07/15   Juanito Doom, MD    Family History Family History  Problem Relation Age of Onset  . CAD Mother   . Alcoholism Father   . COPD Father     Social History Social History   Tobacco Use  . Smoking status: Current Every Day Smoker    Packs/day: 1.00    Years: 30.00    Pack years: 30.00    Types: Cigarettes  . Smokeless tobacco: Never Used  . Tobacco comment: smoking up to 1.5 ppd per husband.   Substance Use Topics  . Alcohol use: Yes    Alcohol/week: 0.0 oz  . Drug use: No    Comment: Hx of polysubstance drug abuse     Allergies   Aspirin and Chlorpromazine hcl   Review of Systems Review of Systems  Constitutional: Negative for fever.  Respiratory: Negative for shortness of breath.   Cardiovascular: Negative for chest pain.  Gastrointestinal: Positive for abdominal pain and nausea. Negative for constipation, diarrhea and  vomiting.  Genitourinary: Negative for dysuria.  Musculoskeletal:       Right hip pain  Skin: Positive for wound.  All other systems reviewed and are negative.    Physical Exam Updated Vital Signs BP (!) 126/55   Pulse 98   Temp 97.8 F (36.6 C) (Oral)   Resp 17   SpO2 98%   Physical Exam  Constitutional: She is oriented to person, place, and time.  Chronically ill-appearing, no acute distress, cachectic appearing  HENT:  Head: Normocephalic and atraumatic.  Neck: Neck supple.  Cardiovascular: Normal rate, regular rhythm and normal heart sounds.  Pulmonary/Chest: Effort normal. No respiratory distress. She has no wheezes.  Abdominal: Soft. Bowel sounds are normal. She exhibits no distension and no pulsatile midline mass.  Scaphoid appearance, no objective tenderness to palpation, distractible  Musculoskeletal:  Normal range of  motion of the right hip and knee, no obvious deformities  Neurological: She is alert and oriented to person, place, and time.  Skin: Skin is warm and dry.  Quarter sized skin tear over the right hip  Psychiatric: She has a normal mood and affect.  Nursing note and vitals reviewed.    ED Treatments / Results  Labs (all labs ordered are listed, but only abnormal results are displayed) Labs Reviewed  LIPASE, BLOOD  COMPREHENSIVE METABOLIC PANEL  CBC  URINALYSIS, ROUTINE W REFLEX MICROSCOPIC    EKG  EKG Interpretation None       Radiology Dg Hip Unilat W Or Wo Pelvis 2-3 Views Right  Result Date: 11/14/2016 CLINICAL DATA:  66 y/o  F; fall tonight with right hip pain. EXAM: DG HIP (WITH OR WITHOUT PELVIS) 2-3V RIGHT COMPARISON:  10/08/2016 right lower extremity radiographs FINDINGS: Chronic fracture deformities of the proximal femurs bilaterally with right proximal nail fixation and lag screw and partially visualize left proximal femur hardware. No right proximal femur fracture or periprosthetic lucency is identified. No pelvic fracture or  diastases. Vascular calcifications. IMPRESSION: No acute fracture or dislocation identified. Chronic fracture fixation of proximal femurs bilaterally with partially visualized hardware, no apparent hardware related complication. Electronically Signed   By: Kristine Garbe M.D.   On: 11/14/2016 00:54    Procedures Procedures (including critical care time)  Medications Ordered in ED Medications  oxyCODONE-acetaminophen (PERCOCET/ROXICET) 5-325 MG per tablet 2 tablet (2 tablets Oral Given 11/14/16 0059)     Initial Impression / Assessment and Plan / ED Course  I have reviewed the triage vital signs and the nursing notes.  Pertinent labs & imaging results that were available during my care of the patient were reviewed by me and considered in my medical decision making (see chart for details).     Presents after a fall.  Reports rolling off her bed multiple times today.  Etiology is unclear.  She also reports abdominal pain.  This is chronic in nature.  Overall her vital signs are reassuring.  She is very chronically ill-appearing and wasted appearing.  She has no objective abdominal tenderness on exam.  She has had several recent visits for the same with a fairly reassuring workup.  Lab work pending per nursing.  X-rays obtained of the right hip show no acute fracture.  Patient is requesting pain medication, she was given 10 mg of Percocet.  Will avoid IV narcotic pain medication as I do not feel that this is appropriate given the chronicity of her symptoms.  2:05 AM I was informed by nursing that the patient was leaving.  She stated multiple times that we were not "helping her."  She left prior to my repeat evaluation.  No lab work obtained prior to discharge.  Final Clinical Impressions(s) / ED Diagnoses   Final diagnoses:  Fall, initial encounter  Right hip pain  Chronic abdominal pain    ED Discharge Orders    None       Sammi Stolarz, Barbette Hair, MD 11/14/16 [phone]

## 2016-11-14 NOTE — ED Notes (Signed)
Attempted to access IV line for blood draws by 3 staff,unsuccessful.MD aware.

## 2016-11-14 NOTE — ED Notes (Signed)
Patient transported to X-ray 

## 2016-12-06 ENCOUNTER — Other Ambulatory Visit: Payer: Self-pay

## 2016-12-06 ENCOUNTER — Emergency Department (HOSPITAL_COMMUNITY)
Admission: EM | Admit: 2016-12-06 | Discharge: 2016-12-07 | Disposition: A | Discharge status: Home / Self Care | Payer: Medicaid Other | Attending: Emergency Medicine | Admitting: Emergency Medicine

## 2016-12-06 DIAGNOSIS — R109 Unspecified abdominal pain: Secondary | ICD-10-CM | POA: Diagnosis not present

## 2016-12-06 DIAGNOSIS — Z5321 Procedure and treatment not carried out due to patient leaving prior to being seen by health care provider: Secondary | ICD-10-CM | POA: Diagnosis not present

## 2016-12-06 NOTE — ED Triage Notes (Addendum)
EMS states pt called due to increased abd pain and SHOB starting this morning, sating 98% RA, 138/98-88-26. Breath sounds clear. Pt requesting to go to lobby to wait

## 2016-12-07 NOTE — ED Notes (Signed)
Called patient name in the lobby and patient does not respond unable to collect labs

## 2016-12-07 NOTE — ED Notes (Signed)
Unable to collect labs patient refused she stated she is a hard stick

## 2016-12-07 NOTE — ED Notes (Signed)
Pt called for repeat VS. No answer from pt and not visualized in [place].

## 2016-12-07 NOTE — ED Notes (Signed)
Pt keeps leaving the facility to go smoke then denies being called when Lab tries to call her back to obtain labs.

## 2017-01-14 ENCOUNTER — Emergency Department (HOSPITAL_COMMUNITY): Payer: Medicaid Other

## 2017-01-14 ENCOUNTER — Encounter (HOSPITAL_COMMUNITY): Payer: Self-pay | Admitting: Emergency Medicine

## 2017-01-14 ENCOUNTER — Emergency Department (HOSPITAL_COMMUNITY)
Admission: EM | Admit: 2017-01-14 | Discharge: 2017-01-15 | Disposition: A | Discharge status: Home / Self Care | Payer: Medicaid Other | Attending: Emergency Medicine | Admitting: Emergency Medicine

## 2017-01-14 DIAGNOSIS — I10 Essential (primary) hypertension: Secondary | ICD-10-CM | POA: Insufficient documentation

## 2017-01-14 DIAGNOSIS — R1013 Epigastric pain: Secondary | ICD-10-CM | POA: Diagnosis not present

## 2017-01-14 DIAGNOSIS — J449 Chronic obstructive pulmonary disease, unspecified: Secondary | ICD-10-CM | POA: Insufficient documentation

## 2017-01-14 DIAGNOSIS — R079 Chest pain, unspecified: Secondary | ICD-10-CM | POA: Diagnosis present

## 2017-01-14 DIAGNOSIS — G8929 Other chronic pain: Secondary | ICD-10-CM | POA: Diagnosis not present

## 2017-01-14 DIAGNOSIS — R109 Unspecified abdominal pain: Secondary | ICD-10-CM | POA: Diagnosis not present

## 2017-01-14 DIAGNOSIS — K5900 Constipation, unspecified: Secondary | ICD-10-CM | POA: Diagnosis not present

## 2017-01-14 DIAGNOSIS — F17210 Nicotine dependence, cigarettes, uncomplicated: Secondary | ICD-10-CM | POA: Insufficient documentation

## 2017-01-14 LAB — BASIC METABOLIC PANEL
Anion gap: 10 (ref 5–15)
BUN: 10 mg/dL (ref 6–20)
CALCIUM: 9.6 mg/dL (ref 8.9–10.3)
CHLORIDE: 99 mmol/L — AB (ref 101–111)
CO2: 22 mmol/L (ref 22–32)
CREATININE: 0.86 mg/dL (ref 0.44–1.00)
GFR calc non Af Amer: >60 mL/min (ref >60)
Glucose, Bld: 94 mg/dL (ref 65–99)
Potassium: 4.3 mmol/L (ref 3.5–5.1)
Sodium: 131 mmol/L — ABNORMAL LOW (ref 135–145)

## 2017-01-14 LAB — I-STAT TROPONIN, ED: Troponin i, poc: 0.01 ng/mL (ref 0.00–0.08)

## 2017-01-14 LAB — CBC
HCT: 38.3 % (ref 36.0–46.0)
Hemoglobin: 13.3 g/dL (ref 12.0–15.0)
MCH: 31.1 pg (ref 26.0–34.0)
MCHC: 34.7 g/dL (ref 30.0–36.0)
MCV: 89.5 fL (ref 78.0–100.0)
PLATELETS: 277 10*3/uL (ref 150–400)
RBC: 4.28 MIL/uL (ref 3.87–5.11)
RDW: 14.5 % (ref 11.5–15.5)
WBC: 7.4 10*3/uL (ref 4.0–10.5)

## 2017-01-14 LAB — LIPASE, BLOOD: Lipase: 19 U/L (ref 11–51)

## 2017-01-14 MED ORDER — HYDROMORPHONE HCL 1 MG/ML IJ SOLN
1.0000 mg | Freq: Once | INTRAMUSCULAR | Status: AC
  Administered 2017-01-15: 1 mg via INTRAVENOUS
  Filled 2017-01-14: qty 1

## 2017-01-14 MED ORDER — ONDANSETRON 4 MG PO TBDP
4.0000 mg | ORAL_TABLET | Freq: Once | ORAL | Status: DC | PRN
  Filled 2017-01-14: qty 1

## 2017-01-14 MED ORDER — ONDANSETRON HCL 4 MG/2ML IJ SOLN
4.0000 mg | Freq: Once | INTRAMUSCULAR | Status: AC
  Administered 2017-01-15: 4 mg via INTRAVENOUS
  Filled 2017-01-14: qty 2

## 2017-01-14 MED ORDER — SODIUM CHLORIDE 0.9 % IV BOLUS (SEPSIS)
1000.0000 mL | Freq: Once | INTRAVENOUS | Status: AC
  Administered 2017-01-15: 1000 mL via INTRAVENOUS

## 2017-01-14 NOTE — ED Notes (Signed)
Ronalee Belts husband call at discharge [phone]

## 2017-01-14 NOTE — ED Notes (Signed)
Pt continues to go outside to smoke. Pt asked for urine sample, pt stated she is not getting that.

## 2017-01-14 NOTE — ED Provider Notes (Signed)
[facility] DEPT Provider Note   CSN: [id number] Arrival date & time: 01/14/17  1836     History   Chief Complaint Chief Complaint  Patient presents with  . Chest Pain  . Abdominal Pain  . Constipation  . not eating    HPI [name] is a 67 y.o. female.  Patient with history of COPD and chronic abdominal pain presenting with diffuse constant epigastric pain for the past 3 days.  States she normally takes Dilaudid for this pain at home but ran out this morning.  Pain is associated with nausea but no vomiting.  There is no fever.  She has had 3 loose bowel movements today that have been nonbloody.  Reports history of ulcers in the past requiring surgery and chronic abdominal pain which is similar to previous.  Had an episode of chest pain at home that was syndrome of her chest and lasted for about 1 hour has since resolved.  Denies cardiac history.  She has a history of pancreatitis and substance abuse in the past but denies any alcohol abuse currently.  History of renal cell cancer which she believes and is in remission.  No pain with urination or blood in the urine.   The history is provided by the patient.  Chest Pain   Associated symptoms include abdominal pain, nausea, shortness of breath and weakness. Pertinent negatives include no dizziness, no headaches and no vomiting.  Abdominal Pain   Associated symptoms include nausea and constipation. Pertinent negatives include vomiting, dysuria, hematuria, headaches, arthralgias and myalgias.  Constipation   Associated symptoms include abdominal pain. Pertinent negatives include no dysuria.    Past Medical History:  Diagnosis Date  . Cancer ([place])    renal ca  . COPD (chronic obstructive pulmonary disease) ([place])   . Pancreatitis   . Pancreatitis   . Seizures ([place])   . Substance abuse [facility])     Patient Active Problem List   Diagnosis Date Noted  . Hip fracture, right ([place]) 06/02/2016  .  Narcotic addiction ([place]) 02/29/2016  . Abdominal pain, chronic, epigastric- due to chronic pancreatitis 02/29/2016  . COPD (chronic obstructive pulmonary disease) ([place]) 02/28/2016  . Cough 09/03/2015  . Abdominal pain 01/03/2015  . Hyponatremia 01/03/2015  . Hypocalcemia 01/03/2015  . Underweight 01/03/2015  . COPD GOLD III with reversible component  07/17/2014  . DTs (delirium tremens) ([place]) 06/02/2013  . Chronic alcoholic pancreatitis ([place]) 06/02/2013  . Lactic acidosis 06/02/2013  . Recurrent acute pancreatitis 03/28/2013  . Alcohol withdrawal ([place]) 03/28/2013  . Pancreatitis 03/09/2013  . Tobacco abuse 03/09/2013  . Alcohol withdrawal syndrome with perceptual disturbance ([place]) 03/09/2013  . Acute alcoholic pancreatitis 82/42/3536  . Benzodiazepine withdrawal ([place]) 02/09/2013  . Protein-calorie malnutrition, severe ([place]) 02/09/2013  . Hypokalemia 02/27/2011  . Abdominal pain, acute 02/25/2011  . Nausea and vomiting 02/25/2011  . Thrombocytopenia ([place]) 02/25/2011  . COPD with acute exacerbation ([place]) 02/25/2011  . HTN (hypertension), malignant 02/25/2011  . KIDNEY CANCER 10/18/2008  . Anxiety state 10/18/2008  . GERD 10/18/2008  . CIRRHOSIS 10/18/2008  . PANCREATITIS, CHRONIC- atrophic pancreas 10/18/2008  . DYSPNEA 10/18/2008  . GASTRIC ULCER, HX OF 10/18/2008    Past Surgical History:  Procedure Laterality Date  . IR GENERIC HISTORICAL  05/08/2015   IR RADIOLOGIST EVAL & MGMT 05/08/2015 Aletta Edouard, MD [facility] INTERV RAD  . IR GENERIC HISTORICAL  01/08/2014   IR RADIOLOGIST EVAL & MGMT 01/08/2014 Aletta Edouard, MD [facility] INTERV RAD  .  KIDNEY SURGERY     removed cancerous lesions  . PARTIAL GASTRECTOMY      OB History    No data available       Home Medications    Prior to Admission medications   Medication Sig Start Date End Date Taking? Authorizing Provider  albuterol (PROVENTIL HFA;VENTOLIN HFA) 108 (90 Base) MCG/ACT inhaler Inhale 2 puffs into the lungs every  6 (six) hours as needed for wheezing or shortness of breath. 02/27/15   Juanito Doom, MD  ciprofloxacin (CIPRO) 500 MG tablet Take 1 tablet (500 mg total) by mouth 2 (two) times daily. 57/3/22   Delora Fuel, MD  CREON 12000 units CPEP capsule Take 12,000 Units by mouth 3 (three) times daily with meals. 08/16/16   [provider]  DULERA 200-5 MCG/ACT AERO INHALE 2 PUFFS INTO THE LUNGS TWICE DAILY Patient taking differently: INHALE 2 PUFFS INTO THE LUNGS TWICE DAILY prn for wheezing 12/01/15   Juanito Doom, MD  HYDROmorphone (DILAUDID) 2 MG tablet TK 1 Tabley by mouth every 6 Hours as needed for pain 06/10/16   [provider]  ipratropium-albuterol (DUONEB) 0.5-2.5 (3) MG/3ML SOLN Take 3 mLs by nebulization every 6 (six) hours as needed (dyspnea not releived by inhaler). 02/29/16   Debbe Odea, MD  LORazepam (ATIVAN) 1 MG tablet Take 1 tablet (1 mg total) by mouth every 6 (six) hours as needed. For anxiety. Patient taking differently: Take 1 mg by mouth every 6 (six) hours as needed for anxiety. For anxiety. 04/01/13   Isaac Bliss, Rayford Halsted, MD  losartan (COZAAR) 100 MG tablet Take 1 tablet (100 mg total) by mouth daily. 09/03/15   Parrett, Fonnie Mu, NP  metroNIDAZOLE (FLAGYL) 500 MG tablet Take 1 tablet (500 mg total) by mouth 3 (three) times daily. 02/09/40   Delora Fuel, MD  ondansetron (ZOFRAN ODT) 4 MG disintegrating tablet Take 1 tablet (4 mg total) by mouth every 8 (eight) hours as needed for nausea or vomiting. 08/10/16   Duffy Bruce, MD  SPIRIVA HANDIHALER 18 MCG inhalation capsule INHALE 1 CAPSULE VIA HANDIHALER ONCE DAILY 11/07/15   Juanito Doom, MD    Family History Family History  Problem Relation Age of Onset  . CAD Mother   . Alcoholism Father   . COPD Father     Social History Social History   Tobacco Use  . Smoking status: Current Every Day Smoker    Packs/day: 1.00    Years: 30.00    Pack years: 30.00    Types: Cigarettes  .  Smokeless tobacco: Never Used  . Tobacco comment: smoking up to 1.5 ppd per husband.   Substance Use Topics  . Alcohol use: Yes    Alcohol/week: 0.0 oz  . Drug use: No    Comment: Hx of polysubstance drug abuse     Allergies   Aspirin and Chlorpromazine hcl   Review of Systems Review of Systems  Constitutional: Positive for activity change and appetite change. Negative for fatigue.  HENT: Negative for congestion and nosebleeds.   Eyes: Negative for visual disturbance.  Respiratory: Positive for chest tightness and shortness of breath.   Cardiovascular: Positive for chest pain. Negative for leg swelling.  Gastrointestinal: Positive for abdominal pain, constipation and nausea. Negative for vomiting.  Genitourinary: Negative for dysuria, hematuria, vaginal bleeding and vaginal discharge.  Musculoskeletal: Negative for arthralgias and myalgias.  Skin: Negative for rash.  Neurological: Positive for weakness. Negative for dizziness and headaches.    all  other systems are negative except as noted in the HPI and PMH.    Physical Exam Updated Vital Signs BP (!) 154/100 (BP Location: Right Arm)   Pulse (!) 103   Temp 98.3 F (36.8 C) (Oral)   Resp 20   SpO2 93%   Physical Exam  Constitutional: She is oriented to person, place, and time. She appears well-developed and well-nourished. No distress.  Cachectic appearing  HENT:  Head: Normocephalic and atraumatic.  Mouth/Throat: Oropharynx is clear and moist. No oropharyngeal exudate.  Eyes: Conjunctivae and EOM are normal. Pupils are equal, round, and reactive to light.  Neck: Normal range of motion. Neck supple.  No meningismus.  Cardiovascular: Normal rate, regular rhythm, normal heart sounds and intact distal pulses.  No murmur heard. Pulmonary/Chest: Effort normal and breath sounds normal. No respiratory distress.  Abdominal: Soft. There is tenderness. There is no rebound and no guarding.  Thin. Epigastric tenderness with  guarding.  Lower abdomen soft.  Genitourinary:  Genitourinary Comments: Chaperone present.  No gross blood, no hemorrhoids or fissures  Musculoskeletal: Normal range of motion. She exhibits no edema or tenderness.  Neurological: She is alert and oriented to person, place, and time. No cranial nerve deficit. She exhibits normal muscle tone. Coordination normal.   5/5 strength throughout. CN 2-12 intact.Equal grip strength.   Skin: Skin is warm.  Psychiatric: She has a normal mood and affect. Her behavior is normal.  Nursing note and vitals reviewed.    ED Treatments / Results  Labs (all labs ordered are listed, but only abnormal results are displayed) Labs Reviewed  BASIC METABOLIC PANEL - Abnormal; Notable for the following components:      Result Value   Sodium 131 (*)    Chloride 99 (*)    All other components within normal limits  URINALYSIS, ROUTINE W REFLEX MICROSCOPIC - Abnormal; Notable for the following components:   Hgb urine dipstick MODERATE (*)    Leukocytes, UA SMALL (*)    Squamous Epithelial / LPF 0-5 (*)    All other components within normal limits  CBC  LIPASE, BLOOD  I-STAT TROPONIN, ED  I-STAT TROPONIN, ED  POC OCCULT BLOOD, ED  I-STAT CG4 LACTIC ACID, ED  I-STAT CG4 LACTIC ACID, ED    EKG  EKG Interpretation  Date/Time:  Friday January 14 2017 18:45:27 EST Ventricular Rate:  116 PR Interval:    QRS Duration: 124 QT Interval:  314 QTC Calculation: 440 R Axis:   85 Text Interpretation:  Sinus tachycardia Multiple premature complexes, vent & supraven Right atrial enlargement Nonspecific intraventricular conduction delay Anteroseptal infarct, old Minimal ST depression, inferior leads Borderline ST elevation, lateral leads Artifact in lead(s) I II III aVR aVL aVF V1 V2 V4 and baseline wander in lead(s) I III aVL Artifact Rate faster Confirmed by Ezequiel Essex [phone]) on 01/14/2017 11:41:59 PM       Radiology Dg Chest 2 View  Result Date:  01/14/2017 CLINICAL DATA:  Abdominal pain and chest pain EXAM: CHEST  2 VIEW COMPARISON:  04/19/2016, 02/28/2016 FINDINGS: Hyperinflation. No acute consolidation or effusion. Normal heart size. Aortic atherosclerosis. Multiple vague nodular opacities in the right upper and mid to lower lung. Old left-sided rib fractures. No pneumothorax IMPRESSION: 1. No acute consolidation, pneumothorax or effusion 2. Vague nodular opacities in the right upper, mid and lower lung, CT chest recommended to assess for possible lung parenchymal abnormality/nodules or possible rib lesion. Electronically Signed   By: Donavan Foil M.D.   On: 01/14/2017  19:26   Ct Chest W Contrast  Result Date: 01/15/2017 CLINICAL DATA:  67 year old female with chest and abdominal pain. History of renal cancer and COPD. EXAM: CT CHEST, ABDOMEN, AND PELVIS WITH CONTRAST TECHNIQUE: Multidetector CT imaging of the chest, abdomen and pelvis was performed following the standard protocol during bolus administration of intravenous contrast. CONTRAST:  45mL ISOVUE-300 IOPAMIDOL (ISOVUE-300) INJECTION 61% COMPARISON:  Chest radiograph dated 01/14/2017 and CT of the abdomen pelvis dated 10/12/2016 FINDINGS: Evaluation is limited due to paucity of intra-abdominal fat and cachexia. CT CHEST FINDINGS Cardiovascular: There is no cardiomegaly or pericardial effusion. Multi vessel coronary vascular calcification. There is moderate atherosclerotic calcification of the thoracic aorta. No aneurysmal dilatation or evidence of dissection. There is atherosclerotic calcification of the origins of the great vessels of the aortic arch. The visualized origins of the great vessels of the aortic arch are otherwise patent. The central pulmonary arteries are grossly unremarkable. Mediastinum/Nodes: There is no hilar or mediastinal adenopathy. Esophagus and thyroid gland are grossly unremarkable. No mediastinal fluid collection. Lungs/Pleura: There is mild centrilobular emphysema.  Multiple scattered subpleural nodules in the left upper lobe. The largest nodule in the left lower lobe posterior to the aorta measures 3 mm (series 4, image 110). Musculoskeletal: There is loss of subcutaneous fat and cachexia. Degenerative changes of the spine. No acute osseous pathology. Old right posterior ninth rib fracture. CT ABDOMEN PELVIS FINDINGS No intra-abdominal free air or free fluid. Hepatobiliary: The liver is unremarkable. There is mild intrahepatic biliary ductal dilatation similar to prior CT the common bile duct is dilated measuring up to 16 mm. No calcified stone noted in the central CBD. The appearance of the biliary tree and CBD is similar to prior CT. The gallbladder is physiologically distended and appears unremarkable. Pancreas: The pancreas is atrophic and poorly visualized. Spleen: Normal in size without focal abnormality. Adrenals/Urinary Tract: The adrenal glands are unremarkable. Stable appearing post ablation changes of the medial inferior pole of the left kidney with dystrophic calcification. There is no hydronephrosis on either side. The urinary bladder is distended otherwise unremarkable. Stomach/Bowel: There is moderate amount of stool throughout the colon. Normal caliber air-filled loops of small and large bowel noted. No evidence of bowel obstruction or active inflammation. Vascular/Lymphatic: Advanced aortoiliac atherosclerotic disease. The IVC is grossly unremarkable. No portal venous gas. There is no adenopathy. Reproductive: The uterus is not visualized, likely surgically absent. Other: None Musculoskeletal: Osteopenia with degenerative changes of the spine. Bilateral femoral fixation hardware. No acute fracture. IMPRESSION: 1. No acute intrathoracic, abdominal, or pelvic pathology. 2. Emphysema and multiple small scattered nodules measuring up to 3 mm. No follow-up needed if patient is low-risk (and has no known or suspected primary neoplasm). Non-contrast chest CT can be  considered in 12 months if patient is high-risk. This recommendation follows the consensus statement: Guidelines for Management of Incidental Pulmonary Nodules Detected on CT Images: From the Fleischner Society 2017; Radiology 2017; 284:228-243. 3. No bowel obstruction or active inflammation. 4. Dilatation of the intrahepatic biliary tree and CBD grossly similar to prior CT. No calcified stone. 5. Post ablation treatment of the inferior pole of the left kidney with dystrophic calcification similar to prior CT. 6. Cachexia. 7. Aortic Atherosclerosis (ICD10-I70.0) and Emphysema (ICD10-J43.9). Electronically Signed   By: Anner Crete M.D.   On: 01/15/2017 02:41   Ct Abdomen Pelvis W Contrast  Result Date: 01/15/2017 CLINICAL DATA:  67 year old female with chest and abdominal pain. History of renal cancer and COPD. EXAM: CT CHEST,  ABDOMEN, AND PELVIS WITH CONTRAST TECHNIQUE: Multidetector CT imaging of the chest, abdomen and pelvis was performed following the standard protocol during bolus administration of intravenous contrast. CONTRAST:  17mL ISOVUE-300 IOPAMIDOL (ISOVUE-300) INJECTION 61% COMPARISON:  Chest radiograph dated 01/14/2017 and CT of the abdomen pelvis dated 10/12/2016 FINDINGS: Evaluation is limited due to paucity of intra-abdominal fat and cachexia. CT CHEST FINDINGS Cardiovascular: There is no cardiomegaly or pericardial effusion. Multi vessel coronary vascular calcification. There is moderate atherosclerotic calcification of the thoracic aorta. No aneurysmal dilatation or evidence of dissection. There is atherosclerotic calcification of the origins of the great vessels of the aortic arch. The visualized origins of the great vessels of the aortic arch are otherwise patent. The central pulmonary arteries are grossly unremarkable. Mediastinum/Nodes: There is no hilar or mediastinal adenopathy. Esophagus and thyroid gland are grossly unremarkable. No mediastinal fluid collection. Lungs/Pleura: There  is mild centrilobular emphysema. Multiple scattered subpleural nodules in the left upper lobe. The largest nodule in the left lower lobe posterior to the aorta measures 3 mm (series 4, image 110). Musculoskeletal: There is loss of subcutaneous fat and cachexia. Degenerative changes of the spine. No acute osseous pathology. Old right posterior ninth rib fracture. CT ABDOMEN PELVIS FINDINGS No intra-abdominal free air or free fluid. Hepatobiliary: The liver is unremarkable. There is mild intrahepatic biliary ductal dilatation similar to prior CT the common bile duct is dilated measuring up to 16 mm. No calcified stone noted in the central CBD. The appearance of the biliary tree and CBD is similar to prior CT. The gallbladder is physiologically distended and appears unremarkable. Pancreas: The pancreas is atrophic and poorly visualized. Spleen: Normal in size without focal abnormality. Adrenals/Urinary Tract: The adrenal glands are unremarkable. Stable appearing post ablation changes of the medial inferior pole of the left kidney with dystrophic calcification. There is no hydronephrosis on either side. The urinary bladder is distended otherwise unremarkable. Stomach/Bowel: There is moderate amount of stool throughout the colon. Normal caliber air-filled loops of small and large bowel noted. No evidence of bowel obstruction or active inflammation. Vascular/Lymphatic: Advanced aortoiliac atherosclerotic disease. The IVC is grossly unremarkable. No portal venous gas. There is no adenopathy. Reproductive: The uterus is not visualized, likely surgically absent. Other: None Musculoskeletal: Osteopenia with degenerative changes of the spine. Bilateral femoral fixation hardware. No acute fracture. IMPRESSION: 1. No acute intrathoracic, abdominal, or pelvic pathology. 2. Emphysema and multiple small scattered nodules measuring up to 3 mm. No follow-up needed if patient is low-risk (and has no known or suspected primary  neoplasm). Non-contrast chest CT can be considered in 12 months if patient is high-risk. This recommendation follows the consensus statement: Guidelines for Management of Incidental Pulmonary Nodules Detected on CT Images: From the Fleischner Society 2017; Radiology 2017; 284:228-243. 3. No bowel obstruction or active inflammation. 4. Dilatation of the intrahepatic biliary tree and CBD grossly similar to prior CT. No calcified stone. 5. Post ablation treatment of the inferior pole of the left kidney with dystrophic calcification similar to prior CT. 6. Cachexia. 7. Aortic Atherosclerosis (ICD10-I70.0) and Emphysema (ICD10-J43.9). Electronically Signed   By: Anner Crete M.D.   On: 01/15/2017 02:41    Procedures Procedures (including critical care time)  Medications Ordered in ED Medications  ondansetron (ZOFRAN-ODT) disintegrating tablet 4 mg (not administered)  sodium chloride 0.9 % bolus 1,000 mL (not administered)  ondansetron (ZOFRAN) injection 4 mg (not administered)  HYDROmorphone (DILAUDID) injection 1 mg (not administered)     Initial Impression / Assessment and Plan /  ED Course  I have reviewed the triage vital signs and the nursing notes.  Pertinent labs & imaging results that were available during my care of the patient were reviewed by me and considered in my medical decision making (see chart for details).    Acute on chronic abdominal pain.  Episode of chest pain earlier that has since resolved.  EKG is nonischemic and first troponin is negative.   Labs reassuring. Normal LFTs and lipase. CT obtained given Xray findings. No acute findings. Lung nodules d/w patient.  Troponin negative x2. Doubt ACS or PE.  Tolerating PO. Continue PPI. Follow up with PCP and GI. Return precautions discussed. Patient aware that chronic pain medications cannot be refilled in the ED.  Final Clinical Impressions(s) / ED Diagnoses   Final diagnoses:  Chronic abdominal pain    ED  Discharge Orders    None       Dontray Haberland, Annie Main, MD 01/15/17 [phone]

## 2017-01-14 NOTE — ED Triage Notes (Signed)
Patient c/o abd pain, chest pains, not eating and no BM for 3 days.

## 2017-01-15 ENCOUNTER — Emergency Department (HOSPITAL_COMMUNITY): Payer: Medicaid Other

## 2017-01-15 ENCOUNTER — Encounter (HOSPITAL_COMMUNITY): Payer: Self-pay | Admitting: Radiology

## 2017-01-15 ENCOUNTER — Other Ambulatory Visit: Payer: Self-pay

## 2017-01-15 LAB — I-STAT TROPONIN, ED: TROPONIN I, POC: 0 ng/mL (ref 0.00–0.08)

## 2017-01-15 LAB — URINALYSIS, ROUTINE W REFLEX MICROSCOPIC
BACTERIA UA: NONE SEEN
Bilirubin Urine: NEGATIVE
Glucose, UA: NEGATIVE mg/dL
Ketones, ur: NEGATIVE mg/dL
Nitrite: NEGATIVE
Protein, ur: NEGATIVE mg/dL
SPECIFIC GRAVITY, URINE: 1.012 (ref 1.005–1.030)
pH: 7 (ref 5.0–8.0)

## 2017-01-15 LAB — POC OCCULT BLOOD, ED: Fecal Occult Bld: NEGATIVE

## 2017-01-15 LAB — I-STAT CG4 LACTIC ACID, ED: LACTIC ACID, VENOUS: 1.1 mmol/L (ref 0.5–1.9)

## 2017-01-15 IMAGING — CR DG ABDOMEN ACUTE W/ 1V CHEST
3 series · 3 of 3 positions shown · non-contrast
Comparison: Chest radiograph 06/01/2013.

CLINICAL DATA: Nausea, vomiting, diarrhea and constipation for 3
days.

EXAM:
DG ABDOMEN ACUTE W/ 1V CHEST

[w chest pa]
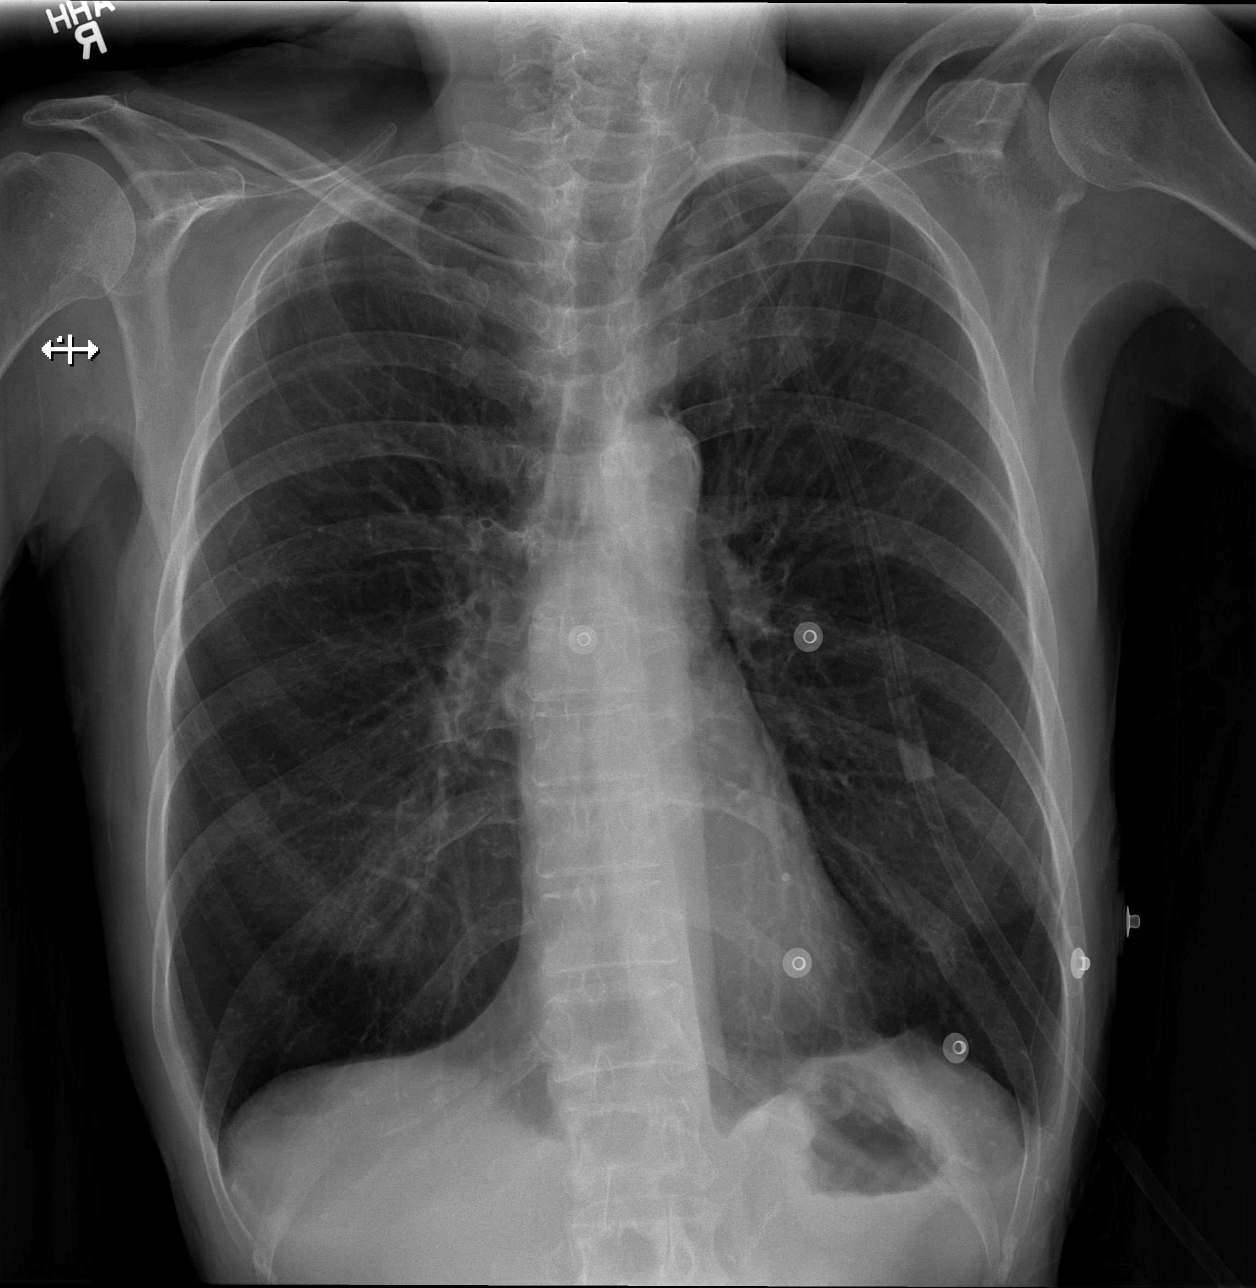

[w abdomen upright]
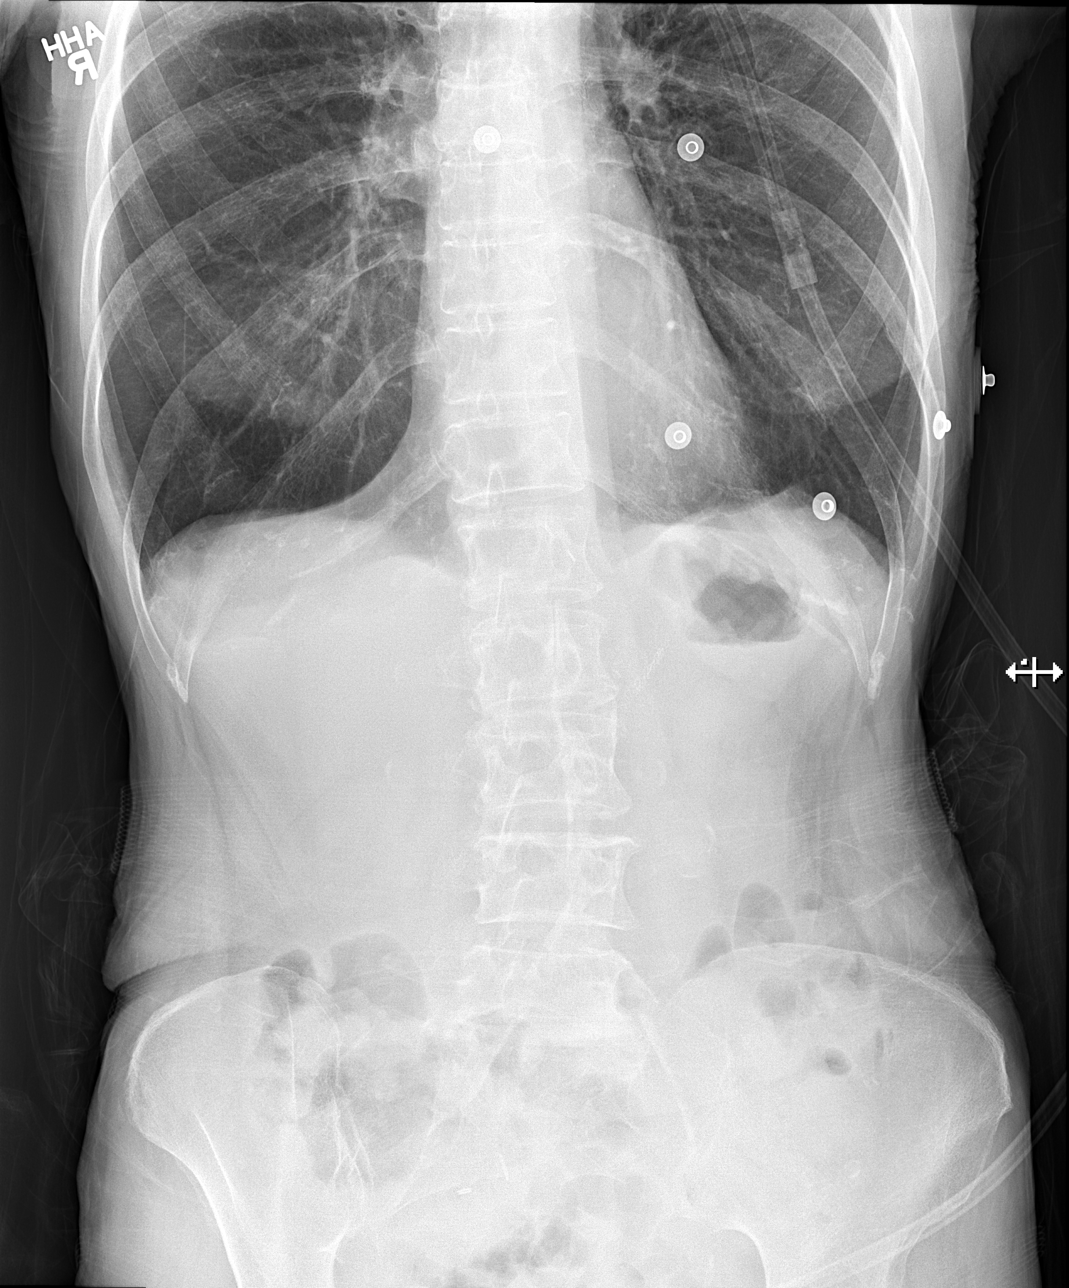

[t abdomen supine]
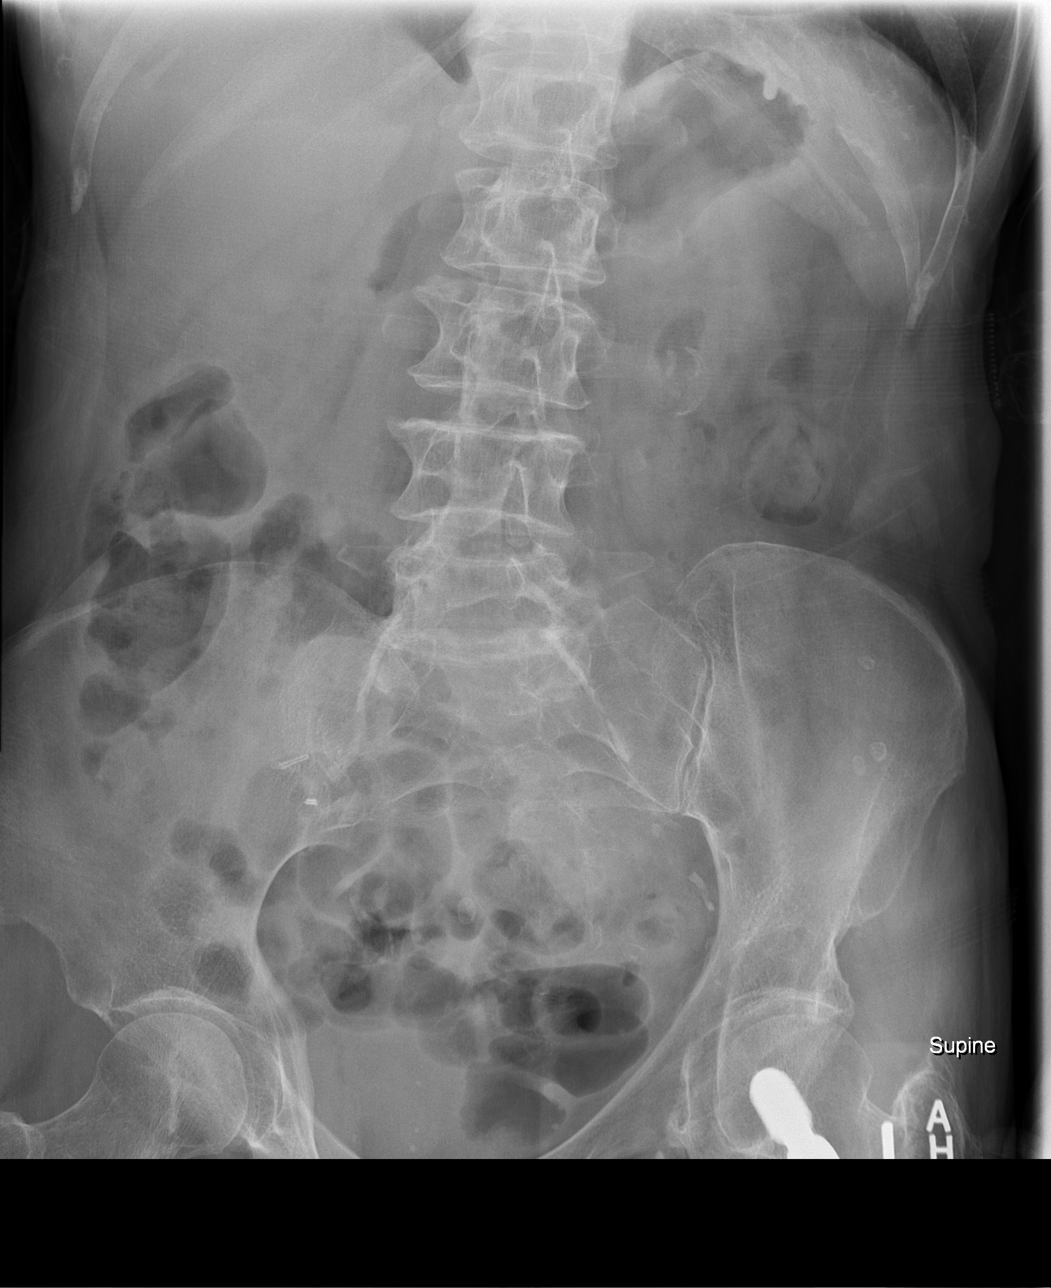

[3 of 3 positions shown; findings below may reference images not displayed]

FINDINGS: Frontal view of the chest shows midline trachea and normal heart
size. Lungs are hyperinflated but clear. No pleural fluid.

Two views of the abdomen show a normal bowel gas pattern. No small
bowel dilatation. Scattered gas and stool in the colon. No
unexpected radiopaque calculi. Atherosclerotic calcification of the
arterial vasculature.
IMPRESSION: No acute findings.

## 2017-01-15 MED ORDER — IOPAMIDOL (ISOVUE-300) INJECTION 61%
80.0000 mL | Freq: Once | INTRAVENOUS | Status: AC | PRN
  Administered 2017-01-15: 80 mL via INTRAVENOUS

## 2017-01-15 MED ORDER — ONDANSETRON HCL 4 MG/2ML IJ SOLN
4.0000 mg | Freq: Once | INTRAMUSCULAR | Status: AC
  Administered 2017-01-15: 4 mg via INTRAVENOUS
  Filled 2017-01-15: qty 2

## 2017-01-15 MED ORDER — IOPAMIDOL (ISOVUE-300) INJECTION 61%
INTRAVENOUS | Status: AC
  Filled 2017-01-15: qty 30

## 2017-01-15 MED ORDER — OMEPRAZOLE 20 MG PO CPDR: 20.0000 mg | DELAYED_RELEASE_CAPSULE | Freq: Every day | ORAL | 0 refills | Status: DC

## 2017-01-15 MED ORDER — IOPAMIDOL (ISOVUE-300) INJECTION 61%
15.0000 mL | Freq: Once | INTRAVENOUS | Status: AC | PRN
  Administered 2017-01-15: 15 mL via ORAL

## 2017-01-15 MED ORDER — HYDROMORPHONE HCL 1 MG/ML IJ SOLN
1.0000 mg | Freq: Once | INTRAMUSCULAR | Status: AC
Start: 2017-01-15 — End: 2017-01-15
  Administered 2017-01-15: 1 mg via INTRAVENOUS
  Filled 2017-01-15: qty 1

## 2017-01-15 MED ORDER — IOPAMIDOL (ISOVUE-300) INJECTION 61%
INTRAVENOUS | Status: AC
  Filled 2017-01-15: qty 100

## 2017-01-15 NOTE — ED Notes (Signed)
Pt refusing to have discharge vitals taken.

## 2017-01-15 NOTE — ED Notes (Signed)
Pt standing in hallway yelling at staff demanding to get additional pain medication. Staff asked pt to return to her room and explained to her that there was no order for pain medication at this time. Pt continued to scream at staff demanding pain medication.

## 2017-01-15 NOTE — Discharge Instructions (Signed)
Follow-up with your primary care doctor for your chronic pain medication.  You should have a repeat CT scan of your chest in 1 year to assess her lung nodules.  Follow-up with the stomach doctor for further testing regarding her abdominal pain.  Return to the ED if you develop new or worsening symptoms.

## 2017-02-05 ENCOUNTER — Emergency Department (HOSPITAL_COMMUNITY)
Admission: EM | Admit: 2017-02-05 | Discharge: 2017-02-05 | Discharge status: Against Medical Advice | Payer: Medicaid Other | Attending: Emergency Medicine | Admitting: Emergency Medicine

## 2017-02-05 ENCOUNTER — Encounter (HOSPITAL_COMMUNITY): Payer: Self-pay | Admitting: Emergency Medicine

## 2017-02-05 ENCOUNTER — Emergency Department (HOSPITAL_COMMUNITY): Payer: Medicaid Other

## 2017-02-05 DIAGNOSIS — R110 Nausea: Secondary | ICD-10-CM | POA: Insufficient documentation

## 2017-02-05 DIAGNOSIS — R109 Unspecified abdominal pain: Secondary | ICD-10-CM

## 2017-02-05 DIAGNOSIS — R079 Chest pain, unspecified: Secondary | ICD-10-CM | POA: Insufficient documentation

## 2017-02-05 DIAGNOSIS — I10 Essential (primary) hypertension: Secondary | ICD-10-CM | POA: Insufficient documentation

## 2017-02-05 DIAGNOSIS — F17210 Nicotine dependence, cigarettes, uncomplicated: Secondary | ICD-10-CM | POA: Insufficient documentation

## 2017-02-05 DIAGNOSIS — R000 Tachycardia, unspecified: Secondary | ICD-10-CM | POA: Insufficient documentation

## 2017-02-05 DIAGNOSIS — R61 Generalized hyperhidrosis: Secondary | ICD-10-CM | POA: Insufficient documentation

## 2017-02-05 DIAGNOSIS — Z85528 Personal history of other malignant neoplasm of kidney: Secondary | ICD-10-CM | POA: Diagnosis not present

## 2017-02-05 DIAGNOSIS — Z79899 Other long term (current) drug therapy: Secondary | ICD-10-CM | POA: Insufficient documentation

## 2017-02-05 DIAGNOSIS — J449 Chronic obstructive pulmonary disease, unspecified: Secondary | ICD-10-CM | POA: Insufficient documentation

## 2017-02-05 DIAGNOSIS — R0602 Shortness of breath: Secondary | ICD-10-CM | POA: Diagnosis not present

## 2017-02-05 DIAGNOSIS — M79606 Pain in leg, unspecified: Secondary | ICD-10-CM | POA: Diagnosis not present

## 2017-02-05 LAB — COMPREHENSIVE METABOLIC PANEL
ALT: 14 U/L (ref 14–54)
ANION GAP: 10 (ref 5–15)
AST: 19 U/L (ref 15–41)
Albumin: 4.4 g/dL (ref 3.5–5.0)
Alkaline Phosphatase: 115 U/L (ref 38–126)
BILIRUBIN TOTAL: 0.3 mg/dL (ref 0.3–1.2)
BUN: 9 mg/dL (ref 6–20)
CO2: 24 mmol/L (ref 22–32)
Calcium: 9.7 mg/dL (ref 8.9–10.3)
Chloride: 99 mmol/L — ABNORMAL LOW (ref 101–111)
Creatinine, Ser: 0.85 mg/dL (ref 0.44–1.00)
Glucose, Bld: 80 mg/dL (ref 65–99)
Potassium: 3.9 mmol/L (ref 3.5–5.1)
Sodium: 133 mmol/L — ABNORMAL LOW (ref 135–145)
TOTAL PROTEIN: 8.4 g/dL — AB (ref 6.5–8.1)

## 2017-02-05 LAB — CBC
HEMATOCRIT: 42.8 % (ref 36.0–46.0)
Hemoglobin: 14.2 g/dL (ref 12.0–15.0)
MCH: 30.8 pg (ref 26.0–34.0)
MCHC: 33.2 g/dL (ref 30.0–36.0)
MCV: 92.8 fL (ref 78.0–100.0)
Platelets: 323 10*3/uL (ref 150–400)
RBC: 4.61 MIL/uL (ref 3.87–5.11)
RDW: 15.1 % (ref 11.5–15.5)
WBC: 10.6 10*3/uL — ABNORMAL HIGH (ref 4.0–10.5)

## 2017-02-05 LAB — I-STAT TROPONIN, ED: TROPONIN I, POC: 0 ng/mL (ref 0.00–0.08)

## 2017-02-05 LAB — LIPASE, BLOOD: LIPASE: 21 U/L (ref 11–51)

## 2017-02-05 MED ORDER — SODIUM CHLORIDE 0.9 % IV BOLUS (SEPSIS)
1000.0000 mL | Freq: Once | INTRAVENOUS | Status: AC
  Administered 2017-02-05: 1000 mL via INTRAVENOUS

## 2017-02-05 MED ORDER — NITROGLYCERIN 0.4 MG SL SUBL
0.4000 mg | SUBLINGUAL_TABLET | SUBLINGUAL | Status: DC | PRN
  Administered 2017-02-05 (×2): 0.4 mg via SUBLINGUAL
  Filled 2017-02-05 (×2): qty 1

## 2017-02-05 MED ORDER — NITROGLYCERIN 0.4 MG SL SUBL
SUBLINGUAL_TABLET | SUBLINGUAL | Status: AC
  Filled 2017-02-05: qty 1

## 2017-02-05 MED ORDER — FENTANYL CITRATE (PF) 100 MCG/2ML IJ SOLN
50.0000 ug | Freq: Once | INTRAMUSCULAR | Status: AC
  Administered 2017-02-05: 50 ug via INTRAVENOUS
  Filled 2017-02-05: qty 2

## 2017-02-05 MED ORDER — LORAZEPAM 2 MG/ML IJ SOLN
0.5000 mg | Freq: Once | INTRAMUSCULAR | Status: AC
  Administered 2017-02-05: 0.5 mg via INTRAVENOUS
  Filled 2017-02-05: qty 1

## 2017-02-05 MED ORDER — IOPAMIDOL (ISOVUE-370) INJECTION 76%
INTRAVENOUS | Status: AC
  Administered 2017-02-05: 100 mL
  Filled 2017-02-05: qty 100

## 2017-02-05 MED ORDER — ASPIRIN 81 MG PO CHEW
324.0000 mg | CHEWABLE_TABLET | Freq: Once | ORAL | Status: AC
  Administered 2017-02-05: 324 mg via ORAL
  Filled 2017-02-05: qty 4

## 2017-02-05 NOTE — ED Notes (Signed)
Pt refused ultra sound at this time. Pt stated she wanted pain medicine first.

## 2017-02-05 NOTE — ED Notes (Addendum)
Initial nitro pulled from pyxis fell on floor. Override pulled.

## 2017-02-05 NOTE — ED Notes (Signed)
ED Provider at bedside. 

## 2017-02-05 NOTE — ED Triage Notes (Signed)
Patient brought in by husband with complaints of tightness in chest that started at 1am this morning. Also reports abdominal pain, nausea. Hx of pancreatitis. Leg pain x3 months no injury.

## 2017-02-05 NOTE — ED Provider Notes (Signed)
[facility] DEPT Provider Note  CSN: [id number] Arrival date & time: 02/05/17 4098  Chief Complaint(s) Chest Pain; Abdominal Pain; and Leg Pain  HPI [name] is a 67 y.o. female   The history is provided by the patient.  Chest Pain   This is a new problem. Episode onset: 7 hrs. The problem occurs constantly. The problem has not changed since onset.The pain is associated with rest. The pain is present in the substernal region. The pain is moderate. The quality of the pain is described as pressure-like and heavy. The pain does not radiate. The symptoms are aggravated by exertion. Associated symptoms include abdominal pain (epigastric, sharp/stabbing. constant. started with chest pain. ), diaphoresis, leg pain (constant with 2-3 months. no trauma), nausea and shortness of breath. Pertinent negatives include no cough, no fever, no lower extremity edema, no sputum production and no vomiting. Risk factors include being elderly and smoking/tobacco exposure.  Her past medical history is significant for cancer, COPD, hyperlipidemia and hypertension.  Pertinent negatives for past medical history include no diabetes, no MI, no strokes and no TIA.    Past Medical History Past Medical History:  Diagnosis Date  . Cancer ([place])    renal ca  . COPD (chronic obstructive pulmonary disease) ([place])   . Pancreatitis   . Pancreatitis   . Seizures ([place])   . Substance abuse [facility])    Patient Active Problem List   Diagnosis Date Noted  . Hip fracture, right ([place]) 06/02/2016  . Narcotic addiction ([place]) 02/29/2016  . Abdominal pain, chronic, epigastric- due to chronic pancreatitis 02/29/2016  . COPD (chronic obstructive pulmonary disease) ([place]) 02/28/2016  . Cough 09/03/2015  . Abdominal pain 01/03/2015  . Hyponatremia 01/03/2015  . Hypocalcemia 01/03/2015  . Underweight 01/03/2015  . COPD GOLD III with reversible component  07/17/2014  . DTs (delirium tremens) ([place])  06/02/2013  . Chronic alcoholic pancreatitis ([place]) 06/02/2013  . Lactic acidosis 06/02/2013  . Recurrent acute pancreatitis 03/28/2013  . Alcohol withdrawal ([place]) 03/28/2013  . Pancreatitis 03/09/2013  . Tobacco abuse 03/09/2013  . Alcohol withdrawal syndrome with perceptual disturbance ([place]) 03/09/2013  . Acute alcoholic pancreatitis 11/91/4782  . Benzodiazepine withdrawal ([place]) 02/09/2013  . Protein-calorie malnutrition, severe ([place]) 02/09/2013  . Hypokalemia 02/27/2011  . Abdominal pain, acute 02/25/2011  . Nausea and vomiting 02/25/2011  . Thrombocytopenia ([place]) 02/25/2011  . COPD with acute exacerbation ([place]) 02/25/2011  . HTN (hypertension), malignant 02/25/2011  . KIDNEY CANCER 10/18/2008  . Anxiety state 10/18/2008  . GERD 10/18/2008  . CIRRHOSIS 10/18/2008  . PANCREATITIS, CHRONIC- atrophic pancreas 10/18/2008  . DYSPNEA 10/18/2008  . GASTRIC ULCER, HX OF 10/18/2008   Home Medication(s) Prior to Admission medications   Medication Sig Start Date End Date Taking? Authorizing Provider  albuterol (PROVENTIL HFA;VENTOLIN HFA) 108 (90 Base) MCG/ACT inhaler Inhale 2 puffs into the lungs every 6 (six) hours as needed for wheezing or shortness of breath. 02/27/15  Yes Juanito Doom, MD  CREON 12000 units CPEP capsule Take 12,000 Units by mouth 3 (three) times daily with meals. 08/16/16  Yes [provider]  DULERA 200-5 MCG/ACT AERO INHALE 2 PUFFS INTO THE LUNGS TWICE DAILY Patient taking differently: INHALE 2 PUFFS INTO THE LUNGS TWICE DAILY prn for wheezing 12/01/15  Yes McQuaid, Ronie Spies, MD  HYDROmorphone (DILAUDID) 4 MG tablet Take 4 mg by mouth 4 (four) times daily. 12/17/16  Yes [provider]  ipratropium-albuterol (DUONEB) 0.5-2.5 (3) MG/3ML SOLN Take 3 mLs by nebulization  every 6 (six) hours as needed (dyspnea not releived by inhaler). 02/29/16  Yes Debbe Odea, MD  LORazepam (ATIVAN) 1 MG tablet Take 1 tablet (1 mg total) by mouth every 6 (six)  hours as needed. For anxiety. Patient taking differently: Take 1 mg by mouth every 6 (six) hours as needed for anxiety. For anxiety. 04/01/13  Yes Isaac Bliss, Rayford Halsted, MD  losartan (COZAAR) 100 MG tablet Take 1 tablet (100 mg total) by mouth daily. 09/03/15  Yes Parrett, Tammy S, NP  omeprazole (PRILOSEC) 20 MG capsule Take 1 capsule (20 mg total) by mouth daily. 01/15/17  Yes Rancour, Annie Main, MD  ondansetron (ZOFRAN ODT) 4 MG disintegrating tablet Take 1 tablet (4 mg total) by mouth every 8 (eight) hours as needed for nausea or vomiting. 08/10/16  Yes Duffy Bruce, MD  SPIRIVA HANDIHALER 18 MCG inhalation capsule INHALE 1 CAPSULE VIA HANDIHALER ONCE DAILY 11/07/15  Yes Juanito Doom, MD  ciprofloxacin (CIPRO) 500 MG tablet Take 1 tablet (500 mg total) by mouth 2 (two) times daily. Patient not taking: Reported on 0/93/2671 24/5/80   Delora Fuel, MD  metroNIDAZOLE (FLAGYL) 500 MG tablet Take 1 tablet (500 mg total) by mouth 3 (three) times daily. Patient not taking: Reported on 9/98/3382 50/5/39   Delora Fuel, MD                                                                                                                                    Past Surgical History Past Surgical History:  Procedure Laterality Date  . IR GENERIC HISTORICAL  05/08/2015   IR RADIOLOGIST EVAL & MGMT 05/08/2015 Aletta Edouard, MD [facility] INTERV RAD  . IR GENERIC HISTORICAL  01/08/2014   IR RADIOLOGIST EVAL & MGMT 01/08/2014 Aletta Edouard, MD [facility] INTERV RAD  . KIDNEY SURGERY     removed cancerous lesions  . PARTIAL GASTRECTOMY     Family History Family History  Problem Relation Age of Onset  . CAD Mother   . Alcoholism Father   . COPD Father     Social History Social History   Tobacco Use  . Smoking status: Current Every Day Smoker    Packs/day: 1.00    Years: 30.00    Pack years: 30.00    Types: Cigarettes  . Smokeless tobacco: Never Used  . Tobacco comment: smoking up to 1.5 ppd per husband.     Substance Use Topics  . Alcohol use: Yes    Alcohol/week: 0.0 oz  . Drug use: No    Comment: Hx of polysubstance drug abuse   Allergies Aspirin and Chlorpromazine hcl  Review of Systems Review of Systems  Constitutional: Positive for diaphoresis. Negative for fever.  Respiratory: Positive for shortness of breath. Negative for cough and sputum production.   Cardiovascular: Positive for chest pain.  Gastrointestinal: Positive for abdominal pain (epigastric, sharp/stabbing. constant. started with chest pain. ) and nausea. Negative for vomiting.  All other systems are reviewed and are negative for acute change except as noted in the HPI  Physical Exam Vital Signs  I have reviewed the triage vital signs BP 114/71 (BP Location: Right Arm)   Pulse 89   Temp 98.3 F (36.8 C) (Oral)   Resp (!) 23   SpO2 99%   Physical Exam  Constitutional: She is oriented to person, place, and time. She appears well-developed and well-nourished. No distress.  HENT:  Head: Normocephalic and atraumatic.  Nose: Nose normal.  Eyes: Conjunctivae and EOM are normal. Pupils are equal, round, and reactive to light. Right eye exhibits no discharge. Left eye exhibits no discharge. No scleral icterus.  Neck: Normal range of motion. Neck supple.  Cardiovascular: Regular rhythm. Tachycardia present. Exam reveals no gallop and no friction rub.  No murmur heard. Pulmonary/Chest: Effort normal and breath sounds normal. No stridor. No respiratory distress. She has no rales.  Abdominal: Soft. She exhibits no distension. There is no tenderness.  Musculoskeletal: She exhibits no edema or tenderness.  Neurological: She is alert and oriented to person, place, and time.  Skin: Skin is warm and dry. No rash noted. She is not diaphoretic. No erythema.  Psychiatric: She has a normal mood and affect.  Vitals reviewed.   ED Results and Treatments Labs (all labs ordered are listed, but only abnormal results are  displayed) Labs Reviewed  CBC - Abnormal; Notable for the following components:      Result Value   WBC 10.6 (*)    All other components within normal limits  COMPREHENSIVE METABOLIC PANEL - Abnormal; Notable for the following components:   Sodium 133 (*)    Chloride 99 (*)    Total Protein 8.4 (*)    All other components within normal limits  LIPASE, BLOOD  I-STAT TROPONIN, ED                                                                                                                         EKG  EKG Interpretation  Date/Time:  Saturday February 05 2017 11:30:50 EST Ventricular Rate:  89 PR Interval:    QRS Duration: 72 QT Interval:  369 QTC Calculation: 449 R Axis:   87 Text Interpretation:  Sinus rhythm Right atrial enlargement Probable lateral infarct, old Otherwise no significant change Confirmed by Addison Lank [phone]) on 02/05/2017 11:44:18 AM      Radiology Dg Chest 2 View  Result Date: 02/05/2017 CLINICAL DATA:  Chest tightness.  Abdominal pain. EXAM: CHEST  2 VIEW COMPARISON:  Chest x-ray January 14, 2017. Chest CT January 15, 2017. FINDINGS: A healed posterior right rib fractures again identified. The heart, hila, and mediastinum are unchanged. Evaluation of the lungs is limited due to over penetration. Within these limitations, no pneumothorax. No pulmonary nodule, mass, or focal infiltrate. IMPRESSION: Limited study due to over penetration. No acute abnormalities identified. Electronically Signed   By: Dorise Bullion III M.D   On: 02/05/2017 10:15  Ct Angio Chest/abd/pel For Dissection W And/or Wo Contrast  Result Date: 02/05/2017 CLINICAL DATA:  Tightness in chest. Abdominal pain. History of pancreatitis. EXAM: CT ANGIOGRAPHY CHEST, ABDOMEN AND PELVIS TECHNIQUE: Multidetector CT imaging through the chest, abdomen and pelvis was performed using the standard protocol during bolus administration of intravenous contrast. Multiplanar reconstructed images and MIPs were  obtained and reviewed to evaluate the vascular anatomy. CONTRAST:  164mL ISOVUE-370 IOPAMIDOL (ISOVUE-370) INJECTION 76% COMPARISON:  January 15, 2017 FINDINGS: CTA CHEST FINDINGS Cardiovascular: Atherosclerosis is seen in the thoracic aorta and proximal branching vessels. No aneurysm or dissection identified. There are coronary artery calcifications. The heart size is normal. The central pulmonary arteries are normal in appearance. Mediastinum/Nodes: No enlarged mediastinal, hilar, or axillary lymph nodes. Thyroid gland, trachea, and esophagus demonstrate no significant findings. Lungs/Pleura: The central airways are normal. No pneumothorax. Mild emphysematous changes seen in the lung apices. Several tiny nodules are seen in the lungs. A nodule in the right apex abutting the pleura measures 5 mm on series 8, image 9, unchanged in the interval. Several other tiny bilateral nodules are seen, unchanged, all measuring on the order of 3 mm or less. No masses or infiltrates. Musculoskeletal: See below. Review of the MIP images confirms the above findings. CTA ABDOMEN AND PELVIS FINDINGS VASCULAR Aorta: The abdominal aorta demonstrates advanced atherosclerotic change extending into the iliac and femoral vessels. No aneurysm or dissection in the aorta. Celiac: Patent without evidence of aneurysm, dissection, vasculitis or significant stenosis. SMA: Patent without evidence of aneurysm, dissection, vasculitis or significant stenosis. Renals: Atherosclerosis is seen at the origin of the renal arteries. There are 2 right renal arteries and 1 left renal artery. The renal arteries are patent and well opacified distal to the atherosclerosis. IMA: Patent without evidence of aneurysm, dissection, vasculitis or significant stenosis. Inflow: Patent without evidence of aneurysm, dissection, vasculitis or significant stenosis. Veins: Evaluation limited due to arterial phase imaging. No venous abnormalities are noted. Review of the MIP  images confirms the above findings. NON-VASCULAR Hepatobiliary: Hepatic steatosis is identified. No liver lesions are masses are noted. The gallbladder is normal in caliber. There is enhancement of the gallbladder wall, possibly due to timing of contrast. There is no definitive wall thickening, adjacent fat stranding, or pericholecystic fluid. The common bile duct is quite dilated measuring up to 14 mm centrally. This is stable in the interval and no filling defects or stones are noted. Mild intrahepatic ductal dilatation centrally is stable as well. The portal vein is well opacified. Pancreas: Unremarkable. No pancreatic ductal dilatation or surrounding inflammatory changes. Spleen: Evaluation limited due to arterial phase imaging. No abnormalities noted. Adrenals/Urinary Tract: The adrenal glands are normal. The right kidney is normal in appearance. Evidence of previous left renal ablation in the medial lower pole is stable. No new or suspicious masses. No hydronephrosis, ureteral stones, or ureterectasis. The bladder is normal. Stomach/Bowel: Evaluation of the bowel is limited without oral contrast. The stomach and small bowel are unremarkable. Moderate fecal loading in the right side of the colon. The appendix is not visualized but there is no secondary evidence of appendicitis. Lymphatic: No adenopathy identified. Reproductive: The uterus is not visualized. Other: No abdominal wall hernia or abnormality. No abdominopelvic ascites. Musculoskeletal: No acute or significant osseous findings. Review of the MIP images confirms the above findings. IMPRESSION: 1. No aortic aneurysm or dissection. Atherosclerotic changes are seen in the thoracic and abdominal aorta extending into the iliac and femoral vessels. 2. Pulmonary nodules, unchanged. The  largest is seen in the right apex measuring 5 mm. No follow-up needed if patient is low-risk. Non-contrast chest CT can be considered in 12 months if patient is high-risk.  This recommendation follows the consensus statement: Guidelines for Management of Incidental Pulmonary Nodules Detected on CT Images: From the Fleischner Society 2017; Radiology 2017; 284:228-243. 3. Coronary artery calcifications. 4. Enhancement of the gallbladder wall may be due to timing of contrast. Gallbladder is otherwise normal in appearance. Ultrasound could better evaluate if there is concern. 5. Intra and extrahepatic biliary duct dilatation is stable since January 15, 2017 with no underlying cause identified. Recommend correlation with labs. 6. Mild emphysematous changes in the lungs. Aortic Atherosclerosis (ICD10-I70.0) and Emphysema (ICD10-J43.9). Electronically Signed   By: Dorise Bullion III M.D   On: 02/05/2017 11:24   Pertinent labs & imaging results that were available during my care of the patient were reviewed by me and considered in my medical decision making (see chart for details).  Medications Ordered in ED Medications  aspirin chewable tablet 324 mg (324 mg Oral Given 02/05/17 0915)  fentaNYL (SUBLIMAZE) injection 50 mcg (50 mcg Intravenous Given 02/05/17 0935)  sodium chloride 0.9 % bolus 1,000 mL (0 mLs Intravenous Stopped 02/05/17 1227)  LORazepam (ATIVAN) injection 0.5 mg (0.5 mg Intravenous Given 02/05/17 0950)  iopamidol (ISOVUE-370) 76 % injection (100 mLs  Contrast Given 02/05/17 1030)                                                                                                                                    Procedures Procedures CRITICAL CARE Performed by: Grayce Sessions Marna Weniger Total critical care time: 40 minutes Critical care time was exclusive of separately billable procedures and treating other patients. Critical care was necessary to treat or prevent imminent or life-threatening deterioration. Critical care was time spent personally by me on the following activities: development of treatment plan with patient and/or surrogate as well as nursing, discussions with  consultants, evaluation of patient's response to treatment, examination of patient, obtaining history from patient or surrogate, ordering and performing treatments and interventions, ordering and review of laboratory studies, ordering and review of radiographic studies, pulse oximetry and re-evaluation of patient's condition.   (including critical care time)  Medical Decision Making / ED Course I have reviewed the nursing notes for this encounter and the patient's prior records (if available in EHR or on provided paperwork).    Initial EKG with frequent PVCs.  Slow down on monitor and revealed sinus tachycardia without evidence of acute ischemia, dysrhythmia, or pericarditis.   Initial assessment concerning for ACS versus dissection versus PE.  Initial troponin negative.  CTA was performed which was negative for dissection or pulmonary embolism.  Patient was provided with nitroglycerin resulting in some improvement in her chest pain.  Recommended admission for ACS rule out.  Regarding patient's abdominal pain.  CT revealed mild gallbladder thickening.  Ordered a right upper  quadrant ultrasound.  However labs revealed only mild leukocytosis and no evidence of biliary obstruction.  Patient was provided with IV pain medicine.   Given her decreased oral intake, I inquired about her compliance with medication and she reported that she had not taken her abdomen in 2 days.  She was provided with 0.5 mg of Ativan, in case her tachycardia was secondary to benzo withdrawal.  Her tachycardia did improve.  Prior to obtaining right upper quadrant ultrasound and admission, patient left AGAINST MEDICAL ADVICE before I could speak with her as I was dealing with a critically sick patient.    Final Clinical Impression(s) / ED Diagnoses Final diagnoses:  Chest pain  Abdominal pain      This chart was dictated using voice recognition software.  Despite best efforts to proofread,  errors can occur which can  change the documentation meaning.   Fatima Blank, MD 02/05/17 [phone]

## 2017-02-05 NOTE — ED Notes (Signed)
Pt ambulates independently to the nurses station, this writer asks if she needed something, her response to this writer is "nothing from you." Pt continues "I'm leaving." Pt has ripped out IV from arm and walked out of the ED.

## 2017-02-05 NOTE — ED Notes (Addendum)
Pt requesting to see doctor immediately- pt states that she does not want to stay if she is not getting more pain medicine and is requesting more pain medicine. MD Cardama has been made aware.

## 2017-02-05 NOTE — ED Notes (Signed)
Pt has called out multiple times about needing to see the doctor. Pt has been informed of care plan and MD has been informed of pt's request to see MD. Pt was heard yelling out from the hallway to the secretary through call bell.

## 2017-02-05 NOTE — ED Notes (Addendum)
Pt verbalizing much frustrating at the fact that she is in pain in her legs, chest, and abd with has had no relief. Spoke with MD Cardama- will recheck EKG, and will administer nitro if BP within parameters, per Cardama

## 2017-02-05 NOTE — ED Notes (Signed)
Bed: [facility]60 Expected date:  Expected time:  Means of arrival:  Comments: Triage 2

## 2017-02-05 NOTE — ED Notes (Signed)
Normally wears 2L O2 at home

## 2017-02-08 ENCOUNTER — Emergency Department (HOSPITAL_COMMUNITY): Payer: Medicaid Other

## 2017-02-08 ENCOUNTER — Other Ambulatory Visit: Payer: Self-pay

## 2017-02-08 ENCOUNTER — Emergency Department (HOSPITAL_COMMUNITY)
Admission: EM | Admit: 2017-02-08 | Discharge: 2017-02-08 | Discharge status: Against Medical Advice | Payer: Medicaid Other | Attending: Emergency Medicine | Admitting: Emergency Medicine

## 2017-02-08 ENCOUNTER — Encounter (HOSPITAL_COMMUNITY): Payer: Self-pay | Admitting: Emergency Medicine

## 2017-02-08 DIAGNOSIS — J449 Chronic obstructive pulmonary disease, unspecified: Secondary | ICD-10-CM | POA: Insufficient documentation

## 2017-02-08 DIAGNOSIS — F1123 Opioid dependence with withdrawal: Secondary | ICD-10-CM | POA: Diagnosis not present

## 2017-02-08 DIAGNOSIS — R509 Fever, unspecified: Secondary | ICD-10-CM | POA: Insufficient documentation

## 2017-02-08 DIAGNOSIS — Z79899 Other long term (current) drug therapy: Secondary | ICD-10-CM | POA: Insufficient documentation

## 2017-02-08 DIAGNOSIS — G8929 Other chronic pain: Secondary | ICD-10-CM

## 2017-02-08 DIAGNOSIS — R0789 Other chest pain: Secondary | ICD-10-CM | POA: Insufficient documentation

## 2017-02-08 DIAGNOSIS — R079 Chest pain, unspecified: Secondary | ICD-10-CM | POA: Diagnosis present

## 2017-02-08 DIAGNOSIS — F17210 Nicotine dependence, cigarettes, uncomplicated: Secondary | ICD-10-CM | POA: Insufficient documentation

## 2017-02-08 DIAGNOSIS — F1193 Opioid use, unspecified with withdrawal: Secondary | ICD-10-CM

## 2017-02-08 DIAGNOSIS — R109 Unspecified abdominal pain: Secondary | ICD-10-CM | POA: Diagnosis not present

## 2017-02-08 LAB — URINALYSIS, ROUTINE W REFLEX MICROSCOPIC
BILIRUBIN URINE: NEGATIVE
GLUCOSE, UA: NEGATIVE mg/dL
KETONES UR: NEGATIVE mg/dL
LEUKOCYTES UA: NEGATIVE
NITRITE: NEGATIVE
PH: 7 (ref 5.0–8.0)
Protein, ur: NEGATIVE mg/dL
Specific Gravity, Urine: 1.006 (ref 1.005–1.030)
Squamous Epithelial / LPF: NONE SEEN

## 2017-02-08 LAB — DIFFERENTIAL
Basophils Absolute: 0 10*3/uL (ref 0.0–0.1)
Basophils Relative: 0 %
EOS ABS: 0 10*3/uL (ref 0.0–0.7)
EOS PCT: 0 %
LYMPHS ABS: 0.6 10*3/uL — AB (ref 0.7–4.0)
Lymphocytes Relative: 7 %
Monocytes Absolute: 0.8 10*3/uL (ref 0.1–1.0)
Monocytes Relative: 10 %
NEUTROS PCT: 83 %
Neutro Abs: 6.9 10*3/uL (ref 1.7–7.7)

## 2017-02-08 LAB — HEPATIC FUNCTION PANEL
ALT: 12 U/L — ABNORMAL LOW (ref 14–54)
AST: 19 U/L (ref 15–41)
Albumin: 4 g/dL (ref 3.5–5.0)
Alkaline Phosphatase: 108 U/L (ref 38–126)
BILIRUBIN TOTAL: 0.6 mg/dL (ref 0.3–1.2)
Total Protein: 7.4 g/dL (ref 6.5–8.1)

## 2017-02-08 LAB — BASIC METABOLIC PANEL
ANION GAP: 13 (ref 5–15)
BUN: 7 mg/dL (ref 6–20)
CHLORIDE: 99 mmol/L — AB (ref 101–111)
CO2: 21 mmol/L — AB (ref 22–32)
Calcium: 9.7 mg/dL (ref 8.9–10.3)
Creatinine, Ser: 0.75 mg/dL (ref 0.44–1.00)
GFR calc non Af Amer: >60 mL/min (ref >60)
Glucose, Bld: 105 mg/dL — ABNORMAL HIGH (ref 65–99)
POTASSIUM: 3.9 mmol/L (ref 3.5–5.1)
SODIUM: 133 mmol/L — AB (ref 135–145)

## 2017-02-08 LAB — CBC
HEMATOCRIT: 39.6 % (ref 36.0–46.0)
HEMOGLOBIN: 13.6 g/dL (ref 12.0–15.0)
MCH: 31.1 pg (ref 26.0–34.0)
MCHC: 34.3 g/dL (ref 30.0–36.0)
MCV: 90.6 fL (ref 78.0–100.0)
PLATELETS: 245 10*3/uL (ref 150–400)
RBC: 4.37 MIL/uL (ref 3.87–5.11)
RDW: 14.5 % (ref 11.5–15.5)
WBC: 8.3 10*3/uL (ref 4.0–10.5)

## 2017-02-08 LAB — RAPID URINE DRUG SCREEN, HOSP PERFORMED
Amphetamines: NOT DETECTED
BARBITURATES: NOT DETECTED
Benzodiazepines: POSITIVE — AB
COCAINE: NOT DETECTED
OPIATES: NOT DETECTED
Tetrahydrocannabinol: NOT DETECTED

## 2017-02-08 LAB — I-STAT TROPONIN, ED: Troponin i, poc: 0.01 ng/mL (ref 0.00–0.08)

## 2017-02-08 LAB — LIPASE, BLOOD: LIPASE: 22 U/L (ref 11–51)

## 2017-02-08 MED ORDER — HYDROMORPHONE HCL 1 MG/ML IJ SOLN
0.5000 mg | Freq: Once | INTRAMUSCULAR | Status: AC
  Administered 2017-02-08: 0.5 mg via INTRAVENOUS
  Filled 2017-02-08: qty 1

## 2017-02-08 MED ORDER — SODIUM CHLORIDE 0.9 % IV SOLN
1000.0000 mL | INTRAVENOUS | Status: DC
  Administered 2017-02-08: 1000 mL via INTRAVENOUS

## 2017-02-08 MED ORDER — ONDANSETRON HCL 4 MG/2ML IJ SOLN
4.0000 mg | Freq: Once | INTRAMUSCULAR | Status: AC
  Administered 2017-02-08: 4 mg via INTRAVENOUS
  Filled 2017-02-08: qty 2

## 2017-02-08 MED ORDER — VANCOMYCIN HCL IN DEXTROSE 1-5 GM/200ML-% IV SOLN: 1000.0000 mg | Freq: Once | INTRAVENOUS | Status: DC

## 2017-02-08 MED ORDER — SODIUM CHLORIDE 0.9 % IV BOLUS (SEPSIS)
250.0000 mL | Freq: Once | INTRAVENOUS | Status: AC
  Administered 2017-02-08: 250 mL via INTRAVENOUS

## 2017-02-08 MED ORDER — VANCOMYCIN HCL IN DEXTROSE 750-5 MG/150ML-% IV SOLN
750.0000 mg | Freq: Once | INTRAVENOUS | Status: AC
  Administered 2017-02-08: 750 mg via INTRAVENOUS
  Filled 2017-02-08: qty 150

## 2017-02-08 MED ORDER — SODIUM CHLORIDE 0.9 % IV BOLUS (SEPSIS)
1000.0000 mL | Freq: Once | INTRAVENOUS | Status: AC
  Administered 2017-02-08: 1000 mL via INTRAVENOUS

## 2017-02-08 MED ORDER — PIPERACILLIN-TAZOBACTAM 3.375 G IVPB 30 MIN
3.3750 g | Freq: Once | INTRAVENOUS | Status: AC
  Administered 2017-02-08: 3.375 g via INTRAVENOUS
  Filled 2017-02-08: qty 50

## 2017-02-08 MED ORDER — ACETAMINOPHEN 500 MG PO TABS
1000.0000 mg | ORAL_TABLET | Freq: Once | ORAL | Status: AC
  Administered 2017-02-08: 1000 mg via ORAL
  Filled 2017-02-08: qty 2

## 2017-02-08 NOTE — ED Notes (Signed)
Pt is refusing to have flue swap done, pt states she wants to leave AMA.

## 2017-02-08 NOTE — ED Triage Notes (Signed)
Per [facility]: Pt to ED from home c/o recurrent central, non-radiating CP onset today. Patient states she was seen for same a few weeks ago, no known cardiac history. Patient states she also has SOB and nausea, no vomiting. Skin warm/dry. Patient has chronic pain, but never in her chest - patient's husband states she has been yelling out in pain and moaning constantly for the past two weeks. EMS VS: HR 126 ST with runs of bigeminy, 144/98, 98% RA.

## 2017-02-08 NOTE — ED Notes (Signed)
Pt states that she wants to leave AMA. Dr. Leonides Schanz notified. pt is been oriented multiple times about pain management and she is not willing to follow ED provider orders. Pt oriented that she will need to be full evaluated but she is only concern about getting narcotics and states if she doesn't get the pain medications as she need it she will leave. Pt has frequent hx of AMA.

## 2017-02-08 NOTE — ED Provider Notes (Signed)
TIME SEEN: 2:38 AM  CHIEF COMPLAINT: Multiple complaints  HPI: Patient is a 67 year old female with history of COPD, pancreatitis, narcotic dependence who presents to the emergency department with complaints of right-sided chest pain, right-sided abdominal pain, right-sided hip and leg pain.  Patient and husband reports that the symptoms have been ongoing for years.  No new acute change.  She states she ran out of Dilaudid several days ago.  She still has Ativan at home but did not take any today because she "felt so sick".  Has had nausea but no vomiting.  No documented fevers.  Has chronic cough and wears oxygen intermittently at home.  Chronically feels short of breath.  ROS: See HPI Constitutional: no fever  Eyes: no drainage  ENT: no runny nose   Cardiovascular:   chest pain  Resp:  SOB  GI: no vomiting GU: no dysuria Integumentary: no rash  Allergy: no hives  Musculoskeletal: no leg swelling  Neurological: no slurred speech ROS otherwise negative  PAST MEDICAL HISTORY/PAST SURGICAL HISTORY:  Past Medical History:  Diagnosis Date  . Cancer ([place])    renal ca  . COPD (chronic obstructive pulmonary disease) ([place])   . Pancreatitis   . Pancreatitis   . Seizures ([place])   . Substance abuse ([place])     MEDICATIONS:  Prior to Admission medications   Medication Sig Start Date End Date Taking? Authorizing Provider  albuterol (PROVENTIL HFA;VENTOLIN HFA) 108 (90 Base) MCG/ACT inhaler Inhale 2 puffs into the lungs every 6 (six) hours as needed for wheezing or shortness of breath. 02/27/15   Juanito Doom, MD  ciprofloxacin (CIPRO) 500 MG tablet Take 1 tablet (500 mg total) by mouth 2 (two) times daily. Patient not taking: Reported on 0/26/3785 88/5/02   Delora Fuel, MD  CREON 12000 units CPEP capsule Take 12,000 Units by mouth 3 (three) times daily with meals. 08/16/16   [provider]  DULERA 200-5 MCG/ACT AERO INHALE 2 PUFFS INTO THE LUNGS TWICE DAILY Patient taking  differently: INHALE 2 PUFFS INTO THE LUNGS TWICE DAILY prn for wheezing 12/01/15   Juanito Doom, MD  HYDROmorphone (DILAUDID) 4 MG tablet Take 4 mg by mouth 4 (four) times daily. 12/17/16   [provider]  ipratropium-albuterol (DUONEB) 0.5-2.5 (3) MG/3ML SOLN Take 3 mLs by nebulization every 6 (six) hours as needed (dyspnea not releived by inhaler). 02/29/16   Debbe Odea, MD  LORazepam (ATIVAN) 1 MG tablet Take 1 tablet (1 mg total) by mouth every 6 (six) hours as needed. For anxiety. Patient taking differently: Take 1 mg by mouth every 6 (six) hours as needed for anxiety. For anxiety. 04/01/13   Isaac Bliss, Rayford Halsted, MD  losartan (COZAAR) 100 MG tablet Take 1 tablet (100 mg total) by mouth daily. 09/03/15   Parrett, Fonnie Mu, NP  metroNIDAZOLE (FLAGYL) 500 MG tablet Take 1 tablet (500 mg total) by mouth 3 (three) times daily. Patient not taking: Reported on 7/74/1287 86/7/67   Delora Fuel, MD  omeprazole (PRILOSEC) 20 MG capsule Take 1 capsule (20 mg total) by mouth daily. 01/15/17   Rancour, Annie Main, MD  ondansetron (ZOFRAN ODT) 4 MG disintegrating tablet Take 1 tablet (4 mg total) by mouth every 8 (eight) hours as needed for nausea or vomiting. 08/10/16   Duffy Bruce, MD  SPIRIVA HANDIHALER 18 MCG inhalation capsule INHALE 1 CAPSULE VIA HANDIHALER ONCE DAILY 11/07/15   Juanito Doom, MD    ALLERGIES:  Allergies  Allergen Reactions  . Aspirin  Other (See Comments)    bleeding  . Chlorpromazine Hcl Other (See Comments)    Muscle spasms    SOCIAL HISTORY:  Social History   Tobacco Use  . Smoking status: Current Every Day Smoker    Packs/day: 1.00    Years: 30.00    Pack years: 30.00    Types: Cigarettes  . Smokeless tobacco: Never Used  . Tobacco comment: smoking up to 1.5 ppd per husband.   Substance Use Topics  . Alcohol use: Yes    Alcohol/week: 0.0 oz    FAMILY HISTORY: Family History  Problem Relation Age of Onset  . CAD Mother   . Alcoholism  Father   . COPD Father     EXAM: BP (!) 179/120   Pulse (!) 119   Temp (!) 100.4 F (38 C) (Rectal)   Resp (!) 26   SpO2 100%  CONSTITUTIONAL: Alert and oriented and responds appropriately to questions.  Appears uncomfortable.  Chronically ill-appearing.  Appears malnourished and very thin.  Febrile. HEAD: Normocephalic EYES: Conjunctivae clear, pupils appear equal, EOMI ENT: normal nose; moist mucous membranes NECK: Supple, no meningismus, no nuchal rigidity, no LAD  CARD: Regular and tachycardic; S1 and S2 appreciated; no murmurs, no clicks, no rubs, no gallops RESP: Normal chest excursion without splinting or tachypnea; breath sounds clear and equal bilaterally; no wheezes, no rhonchi, no rales, no hypoxia or respiratory distress, speaking full sentences ABD/GI: Normal bowel sounds; non-distended; soft, mentally tender throughout the abdomen, no rebound, no guarding, no peritoneal signs, no hepatosplenomegaly BACK:  The back appears normal and is non-tender to palpation, there is no CVA tenderness EXT: Diffusely tender over the right hip without erythema, warmth, bony deformity or joint effusion.  Normal ROM in all joints; otherwise extremities are non-tender to palpation; no edema; normal capillary refill; no cyanosis, no calf tenderness or swelling    SKIN: Normal color for age and race; warm; no rash NEURO: Moves all extremities equally PSYCH: The patient's mood and manner are appropriate. Grooming and personal hygiene are appropriate.  MEDICAL DECISION MAKING: Patient here with complains of chronic right-sided pain.  She is tender over the abdomen and right hip which is chronic.  She is tachycardic here and appears to be uncomfortable.  I suspect there is some component of withdrawal.  She is very warm to touch and rectal temperature was 100.4.  I am also concerned for possible sepsis.  She is chronically ill-appearing and appears very malnourished.  Patient often leaves the hospital  AGAINST MEDICAL ADVICE or elopes after receiving her pain medication.  Her husband has encouraged her to stay.  Will start IV fluids, broad-spectrum antibiotics, obtain labs, urine, cultures, flu swab.  We will give her small dose of pain medication here for chronic pain, withdrawal.  She was seen on February 2 and had a negative CTA of her chest, abdomen and pelvis.  Question some gallbladder wall thickening at that time.  Recommended a right upper quadrant ultrasound which patient not receive as she left prior to receiving this imaging.  Will order right upper quadrant ultrasound today.  I have low suspicion for ACS, dissection or PE at this time.  ED PROGRESS: Patient's vital signs have improved after Dilaudid.  She has continually asked for re-dosing of pain medication.  Her labs are unremarkable.  No leukocytosis, normal lactate, negative troponin.  Normal LFTs and lipase.  Cultures pending.  Patient has a negative chest x-ray.  Right upper quadrant ultrasound shows no acute  cholecystitis.  She does have some gallbladder wall thickening and dilation of the common bile duct but normal LFTs and lipase.  Patient has decided to leave [place] which she frequently does.  Her husband reported to me that normally after she gets her "fix" she leaves the emergency department.  I recommended she stay given she meets criteria for SIRs but patient refuses.  She does not appear intoxicated and appears to be able to make these decisions for herself.  Oriented x3.  Understands risk of leaving [place].  She states she has Ativan at home.  No sign of life-threatening withdrawal.  She needs to follow-up with her primary care physician for further pain management as an outpatient.  Again we have recommended several times she stay for further workup and she refuses.   I have reviewed patient's urine results after she had left the emergency department.  She does appear to have gross hematuria but  this was a catheterized specimen.  No other sign of infection.  Drug screen positive for benzodiazepines.   I reviewed all nursing notes, vitals, pertinent previous records, EKGs, lab and urine results, imaging (as available).     EKG Interpretation  Date/Time:  Tuesday February 08 2017 02:07:27 EST Ventricular Rate:  119 PR Interval:    QRS Duration: 67 QT Interval:  301 QTC Calculation: 424 R Axis:   84 Text Interpretation:  Sinus tachycardia Ventricular premature complex Biatrial enlargement Borderline right axis deviation Artifact in lead(s) III aVL No significant change since last tracing other than rate is faster Confirmed by Essie Gehret, Cyril Mourning [id number]) on 02/08/2017 2:09:36 AM        CRITICAL CARE Performed by: Cyril Mourning Chace Klippel   Total critical care time: 45 minutes  Critical care time was exclusive of separately billable procedures and treating other patients.  Critical care was necessary to treat or prevent imminent or life-threatening deterioration.  Critical care was time spent personally by me on the following activities: development of treatment plan with patient and/or surrogate as well as nursing, discussions with consultants, evaluation of patient's response to treatment, examination of patient, obtaining history from patient or surrogate, ordering and performing treatments and interventions, ordering and review of laboratory studies, ordering and review of radiographic studies, pulse oximetry and re-evaluation of patient's condition.    Neveyah Garzon, Delice Bison, DO 02/08/17 [phone]

## 2017-02-13 LAB — CULTURE, BLOOD (ROUTINE X 2)
Culture: NO GROWTH
Culture: NO GROWTH
SPECIAL REQUESTS: ADEQUATE
Special Requests: ADEQUATE

## 2017-03-08 ENCOUNTER — Emergency Department (HOSPITAL_COMMUNITY)
Admission: EM | Admit: 2017-03-08 | Discharge: 2017-03-09 | Disposition: A | Discharge status: Home / Self Care | Payer: Medicaid Other | Attending: Emergency Medicine | Admitting: Emergency Medicine

## 2017-03-08 ENCOUNTER — Emergency Department (HOSPITAL_COMMUNITY): Payer: Medicaid Other

## 2017-03-08 ENCOUNTER — Encounter (HOSPITAL_COMMUNITY): Payer: Self-pay

## 2017-03-08 DIAGNOSIS — K861 Other chronic pancreatitis: Secondary | ICD-10-CM | POA: Insufficient documentation

## 2017-03-08 DIAGNOSIS — J449 Chronic obstructive pulmonary disease, unspecified: Secondary | ICD-10-CM | POA: Insufficient documentation

## 2017-03-08 DIAGNOSIS — M79604 Pain in right leg: Secondary | ICD-10-CM | POA: Diagnosis not present

## 2017-03-08 DIAGNOSIS — R109 Unspecified abdominal pain: Secondary | ICD-10-CM | POA: Diagnosis not present

## 2017-03-08 DIAGNOSIS — F17210 Nicotine dependence, cigarettes, uncomplicated: Secondary | ICD-10-CM | POA: Diagnosis not present

## 2017-03-08 DIAGNOSIS — R079 Chest pain, unspecified: Secondary | ICD-10-CM | POA: Insufficient documentation

## 2017-03-08 DIAGNOSIS — Z79899 Other long term (current) drug therapy: Secondary | ICD-10-CM | POA: Insufficient documentation

## 2017-03-08 DIAGNOSIS — I10 Essential (primary) hypertension: Secondary | ICD-10-CM | POA: Diagnosis not present

## 2017-03-08 DIAGNOSIS — G8929 Other chronic pain: Secondary | ICD-10-CM

## 2017-03-08 HISTORY — DX: Malingerer (conscious simulation): Z76.5

## 2017-03-08 LAB — HEPATIC FUNCTION PANEL
ALT: 10 U/L — ABNORMAL LOW (ref 14–54)
AST: 16 U/L (ref 15–41)
Albumin: 3.3 g/dL — ABNORMAL LOW (ref 3.5–5.0)
Alkaline Phosphatase: 90 U/L (ref 38–126)
Total Bilirubin: 0.4 mg/dL (ref 0.3–1.2)
Total Protein: 6.4 g/dL — ABNORMAL LOW (ref 6.5–8.1)

## 2017-03-08 LAB — BASIC METABOLIC PANEL
ANION GAP: 8 (ref 5–15)
BUN: 12 mg/dL (ref 6–20)
CALCIUM: 8.9 mg/dL (ref 8.9–10.3)
CO2: 24 mmol/L (ref 22–32)
CREATININE: 0.94 mg/dL (ref 0.44–1.00)
Chloride: 105 mmol/L (ref 101–111)
GFR calc non Af Amer: >60 mL/min (ref >60)
Glucose, Bld: 89 mg/dL (ref 65–99)
Potassium: 3.8 mmol/L (ref 3.5–5.1)
SODIUM: 137 mmol/L (ref 135–145)

## 2017-03-08 LAB — CBC
HCT: 31.1 % — ABNORMAL LOW (ref 36.0–46.0)
HEMOGLOBIN: 10.1 g/dL — AB (ref 12.0–15.0)
MCH: 30 pg (ref 26.0–34.0)
MCHC: 32.5 g/dL (ref 30.0–36.0)
MCV: 92.3 fL (ref 78.0–100.0)
Platelets: 218 10*3/uL (ref 150–400)
RBC: 3.37 MIL/uL — AB (ref 3.87–5.11)
RDW: 15 % (ref 11.5–15.5)
WBC: 7 10*3/uL (ref 4.0–10.5)

## 2017-03-08 LAB — I-STAT TROPONIN, ED: TROPONIN I, POC: 0 ng/mL (ref 0.00–0.08)

## 2017-03-08 LAB — POC OCCULT BLOOD, ED: FECAL OCCULT BLD: NEGATIVE

## 2017-03-08 LAB — LIPASE, BLOOD: LIPASE: 22 U/L (ref 11–51)

## 2017-03-08 MED ORDER — ONDANSETRON 4 MG PO TBDP
8.0000 mg | ORAL_TABLET | Freq: Once | ORAL | Status: AC
  Administered 2017-03-08: 8 mg via ORAL
  Filled 2017-03-08: qty 2

## 2017-03-08 MED ORDER — KETOROLAC TROMETHAMINE 15 MG/ML IJ SOLN: 15.0000 mg | Freq: Once | INTRAMUSCULAR | Status: DC

## 2017-03-08 MED ORDER — KETOROLAC TROMETHAMINE 15 MG/ML IJ SOLN
15.0000 mg | Freq: Once | INTRAMUSCULAR | Status: AC
  Administered 2017-03-08: 15 mg via INTRAMUSCULAR
  Filled 2017-03-08: qty 1

## 2017-03-08 MED ORDER — FENTANYL CITRATE (PF) 100 MCG/2ML IJ SOLN
25.0000 ug | Freq: Once | INTRAMUSCULAR | Status: AC
  Administered 2017-03-08: 25 ug via INTRAMUSCULAR
  Filled 2017-03-08: qty 2

## 2017-03-08 MED ORDER — FENTANYL CITRATE (PF) 100 MCG/2ML IJ SOLN: 25.0000 ug | Freq: Once | INTRAMUSCULAR | Status: DC

## 2017-03-08 NOTE — ED Provider Notes (Signed)
[facility] EMERGENCY DEPARTMENT Provider Note   CSN: [id number] Arrival date & time: 03/08/17  2203   History   Chief Complaint Chief Complaint  Patient presents with  . Chest Pain  . Abdominal Pain    HPI [name] is a 67 y.o. female who presents with chest pain, abdominal pain, and right leg pain. PMH significant for COPD on intermittent O2, chronic pancreatitis (states she does not drink), narcotic dependence, low BMI. She reports a worsening cough for 2 days and central chest pressure which started tonight. She denies a cardiac history. She states that her abdominal pain has worsened tonight and then her chest started hurting. Her abdominal pain feels like her chronic pain but just worse because she doesn't have anything for pain. She attributes this to her pancreatitis. She states her last dose of pain medicine was yesterday. She also notes that she has had red streaked stools for the past 2 weeks but has not had grossly bloody stools. Past surgical hx significant for gastrectomy. She states she has had a colonoscopy years ago. She is not on blood thinners. Narcotic database was queried which shows she filled Dilaudid 4mg  #120 on 2/13 and Ativan 0.5mg  #180 on 2/7. No recent surgery/travel/immobilization, leg swelling, hemptysis, prior DVT/PE, or hormone use. She does have a prior hx of renal cancer.  Additionally she is complaining of right leg pain. The pain is from the right hip to the right knee. The pain is constant. She denies back pain. She has been able to ambulate. She denies any falls or new injuries. She denies fever, chills, URI symptoms, SOB, wheezing, vomiting, diarrhea, urinary symptoms.  HPI  Past Medical History:  Diagnosis Date  . Cancer ([place])    renal ca  . COPD (chronic obstructive pulmonary disease) ([place])   . Pancreatitis   . Pancreatitis   . Seizures ([place])   . Substance abuse [facility])     Patient Active Problem List   Diagnosis Date  Noted  . Hip fracture, right ([place]) 06/02/2016  . Narcotic addiction ([place]) 02/29/2016  . Abdominal pain, chronic, epigastric- due to chronic pancreatitis 02/29/2016  . COPD (chronic obstructive pulmonary disease) ([place]) 02/28/2016  . Cough 09/03/2015  . Abdominal pain 01/03/2015  . Hyponatremia 01/03/2015  . Hypocalcemia 01/03/2015  . Underweight 01/03/2015  . COPD GOLD III with reversible component  07/17/2014  . DTs (delirium tremens) ([place]) 06/02/2013  . Chronic alcoholic pancreatitis ([place]) 06/02/2013  . Lactic acidosis 06/02/2013  . Recurrent acute pancreatitis 03/28/2013  . Alcohol withdrawal ([place]) 03/28/2013  . Pancreatitis 03/09/2013  . Tobacco abuse 03/09/2013  . Alcohol withdrawal syndrome with perceptual disturbance ([place]) 03/09/2013  . Acute alcoholic pancreatitis 07/04/1599  . Benzodiazepine withdrawal ([place]) 02/09/2013  . Protein-calorie malnutrition, severe ([place]) 02/09/2013  . Hypokalemia 02/27/2011  . Abdominal pain, acute 02/25/2011  . Nausea and vomiting 02/25/2011  . Thrombocytopenia ([place]) 02/25/2011  . COPD with acute exacerbation ([place]) 02/25/2011  . HTN (hypertension), malignant 02/25/2011  . KIDNEY CANCER 10/18/2008  . Anxiety state 10/18/2008  . GERD 10/18/2008  . CIRRHOSIS 10/18/2008  . PANCREATITIS, CHRONIC- atrophic pancreas 10/18/2008  . DYSPNEA 10/18/2008  . GASTRIC ULCER, HX OF 10/18/2008    Past Surgical History:  Procedure Laterality Date  . IR GENERIC HISTORICAL  05/08/2015   IR RADIOLOGIST EVAL & MGMT 05/08/2015 Aletta Edouard, MD [facility] INTERV RAD  . IR GENERIC HISTORICAL  01/08/2014   IR RADIOLOGIST EVAL & MGMT 01/08/2014 Aletta Edouard, MD [facility] INTERV RAD  .  KIDNEY SURGERY     removed cancerous lesions  . PARTIAL GASTRECTOMY      OB History    No data available       Home Medications    Prior to Admission medications   Medication Sig Start Date End Date Taking? Authorizing Provider  albuterol (PROVENTIL HFA;VENTOLIN HFA) 108 (90 Base)  MCG/ACT inhaler Inhale 2 puffs into the lungs every 6 (six) hours as needed for wheezing or shortness of breath. 02/27/15  Yes Juanito Doom, MD  CREON 12000 units CPEP capsule Take 12,000 Units by mouth 3 (three) times daily with meals. 08/16/16  Yes [provider]  DULERA 200-5 MCG/ACT AERO INHALE 2 PUFFS INTO THE LUNGS TWICE DAILY Patient taking differently: INHALE 2 PUFFS INTO THE LUNGS TWICE DAILY prn for wheezing 12/01/15  Yes McQuaid, Ronie Spies, MD  ipratropium-albuterol (DUONEB) 0.5-2.5 (3) MG/3ML SOLN Take 3 mLs by nebulization every 6 (six) hours as needed (dyspnea not releived by inhaler). 02/29/16  Yes Debbe Odea, MD  LORazepam (ATIVAN) 1 MG tablet Take 1 tablet (1 mg total) by mouth every 6 (six) hours as needed. For anxiety. Patient taking differently: Take 1 mg by mouth every 6 (six) hours as needed for anxiety. For anxiety. 04/01/13  Yes Isaac Bliss, Rayford Halsted, MD  losartan (COZAAR) 100 MG tablet Take 1 tablet (100 mg total) by mouth daily. 09/03/15  Yes Parrett, Tammy S, NP  omeprazole (PRILOSEC) 20 MG capsule Take 1 capsule (20 mg total) by mouth daily. 01/15/17  Yes Rancour, Annie Main, MD  SPIRIVA HANDIHALER 18 MCG inhalation capsule INHALE 1 CAPSULE VIA HANDIHALER ONCE DAILY 11/07/15  Yes Juanito Doom, MD  ciprofloxacin (CIPRO) 500 MG tablet Take 1 tablet (500 mg total) by mouth 2 (two) times daily. Patient not taking: Reported on 07/01/3149 76/1/60   Delora Fuel, MD  metroNIDAZOLE (FLAGYL) 500 MG tablet Take 1 tablet (500 mg total) by mouth 3 (three) times daily. Patient not taking: Reported on 7/37/1062 69/4/85   Delora Fuel, MD  ondansetron (ZOFRAN ODT) 4 MG disintegrating tablet Take 1 tablet (4 mg total) by mouth every 8 (eight) hours as needed for nausea or vomiting. Patient not taking: Reported on 03/08/2017 08/10/16   Duffy Bruce, MD    Family History Family History  Problem Relation Age of Onset  . CAD Mother   . Alcoholism Father   . COPD  Father     Social History Social History   Tobacco Use  . Smoking status: Current Every Day Smoker    Packs/day: 1.00    Years: 30.00    Pack years: 30.00    Types: Cigarettes  . Smokeless tobacco: Never Used  . Tobacco comment: smoking up to 1.5 ppd per husband.   Substance Use Topics  . Alcohol use: Yes    Alcohol/week: 0.0 oz  . Drug use: No    Comment: Hx of polysubstance drug abuse     Allergies   Aspirin and Chlorpromazine hcl   Review of Systems Review of Systems  Constitutional: Negative for chills and fever.  Respiratory: Positive for cough. Negative for shortness of breath and wheezing.   Cardiovascular: Positive for chest pain. Negative for palpitations and leg swelling.  Gastrointestinal: Positive for abdominal pain and nausea. Negative for diarrhea and vomiting.  Genitourinary: Negative for dysuria.  Musculoskeletal: Positive for arthralgias and myalgias. Negative for back pain, gait problem and joint swelling.  Skin: Negative for wound.  Neurological: Negative for weakness, numbness and headaches.  All  other systems reviewed and are negative.    Physical Exam Updated Vital Signs BP (!) 147/114   Pulse 90   Temp 98.3 F (36.8 C) (Oral)   Resp (!) 26   SpO2 100%   Physical Exam  Constitutional: She is oriented to person, place, and time. No distress.  Extremely thin and chronically ill appearing. Tremulous  HENT:  Head: Normocephalic and atraumatic.  Right Ear: Hearing, tympanic membrane, external ear and ear canal normal.  Left Ear: Hearing, tympanic membrane, external ear and ear canal normal.  Nose: Nose normal.  Mouth/Throat: Uvula is midline and oropharynx is clear and moist. Mucous membranes are dry.  Eyes: Conjunctivae are normal. Pupils are equal, round, and reactive to light. Right eye exhibits no discharge. Left eye exhibits no discharge. No scleral icterus.  Neck: Normal range of motion.  Cardiovascular: Normal rate and regular  rhythm.  Pulmonary/Chest: Effort normal and breath sounds normal. No respiratory distress.  Abdominal: Soft. Bowel sounds are normal. She exhibits no distension. There is tenderness (epigastric).  Surgical scar noted  Musculoskeletal:  No leg swelling. Right hip has FROM. Diffuse right thigh tenderness. Right knee has ROM. 2+ DP pulse. Able to stand and pivot  Neurological: She is alert and oriented to person, place, and time.  Skin: Skin is warm and dry.  Psychiatric: She has a normal mood and affect. Her behavior is normal.  Nursing note and vitals reviewed.    ED Treatments / Results  Labs (all labs ordered are listed, but only abnormal results are displayed) Labs Reviewed  CBC - Abnormal; Notable for the following components:      Result Value   RBC 3.37 (*)    Hemoglobin 10.1 (*)    HCT 31.1 (*)    All other components within normal limits  HEPATIC FUNCTION PANEL - Abnormal; Notable for the following components:   Total Protein 6.4 (*)    Albumin 3.3 (*)    ALT 10 (*)    Bilirubin, Direct <0.1 (*)    All other components within normal limits  BASIC METABOLIC PANEL  LIPASE, BLOOD  I-STAT TROPONIN, ED  POC OCCULT BLOOD, ED    EKG  EKG Interpretation  Date/Time:  Tuesday March 08 2017 22:11:40 EST Ventricular Rate:  101 PR Interval:    QRS Duration: 151 QT Interval:  398 QTC Calculation: 493 R Axis:   100 Text Interpretation:  Sinus tachycardia Multiple premature complexes, vent & supraven Nonspecific intraventricular conduction delay Probable lateral infarct, age indeterminate Anterior infarct, acute (LAD) No significant change since last tracing Confirmed by Thayer Jew [phone]) on 03/09/2017 12:14:29 AM       Radiology Dg Chest 2 View  Result Date: 03/08/2017 CLINICAL DATA:  Chest pain and shortness of breath. EXAM: CHEST - 2 VIEW COMPARISON:  Radiographs 02/08/2017.  CT 01/15/2017 FINDINGS: Mild hyperinflation and emphysema, similar to prior exam. Slight  decrease in central bronchial thickening. Normal heart size and mediastinal contours. There is aortic atherosclerosis. No consolidation, pleural effusion, pneumothorax or pulmonary edema. Bones are under mineralized. IMPRESSION: Hyperinflation and emphysema, imaging findings consistent with COPD. No acute abnormality. Electronically Signed   By: Jeb Levering M.D.   On: 03/08/2017 23:57    Procedures Procedures (including critical care time)  Medications Ordered in ED Medications  acetaminophen (TYLENOL) tablet 650 mg (650 mg Oral Refused 03/09/17 0011)  fentaNYL (SUBLIMAZE) injection 25 mcg (25 mcg Intramuscular Given 03/08/17 2257)  ketorolac (TORADOL) 15 MG/ML injection 15 mg (15 mg Intramuscular  Given 03/08/17 2258)  ondansetron (ZOFRAN-ODT) disintegrating tablet 8 mg (8 mg Oral Given 03/08/17 2325)     Initial Impression / Assessment and Plan / ED Course  I have reviewed the triage vital signs and the nursing notes.  Pertinent labs & imaging results that were available during my care of the patient were reviewed by me and considered in my medical decision making (see chart for details).  67 year old female presents with chest pain, chronic abdominal pain, chronic right leg pain. She is hypertensive but otherwise vitals are normal. She is very thin and malnourished appearing. She is likely having worsening pain due to being out of her pain medicines. I discussed with her that she should not be out because she was given a 30 day supply on 2/14 and is therefore taking too much pain medicine. I told her we would treat her pain her however we would not be giving her prescriptions for narcotic pain medicines. 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 Chest pain work up is reassuring. I have a low suspicion for ACS, PE, pericarditis, esophageal rupture, tension pneumothorax, aortic dissection, cardiac tamponade. Pain is atypical in nature. I suspect she is most likely here due to  being out of pain medicine.  EKG is sinus tachycardia with multiple PVCs which are similar to her prior EKGs. She has a normal rate and rhythm on exam. CXR is negative. Initial troponin is 0. Labs are remarkable for significant drop in hgb (13 one month ago and 10 today). She report small amounts of blood in the stool. Hemoccult was negative.   She was given IM Fentanyl and IM Toradol. Shortly after she is requesting more. Tylenol was ordered which she refused. 2nd troponin was ordered. Care transferred to Dr. Dina Rich.   Final Clinical Impressions(s) / ED Diagnoses   Final diagnoses:  Chest pain, unspecified type  Chronic abdominal pain  Right leg pain    ED Discharge Orders    None       Recardo Evangelist, PA-C 03/09/17 0018    Blanchie Dessert, MD 03/18/17 [phone]

## 2017-03-08 NOTE — ED Notes (Signed)
Pt refused chest x ray unless she receives pain medication and states she had one recently and does not want another

## 2017-03-08 NOTE — ED Triage Notes (Signed)
Pt comes via [facility] for abd pain that has been going on for 30 years, worse today, hx of pancreatitis, R leg sciatic that has been going on for 9 months and today central CP, with SOB, denies n/v. Pt in and out of bigeminy with EMS

## 2017-03-09 ENCOUNTER — Encounter (HOSPITAL_COMMUNITY): Payer: Self-pay

## 2017-03-09 MED ORDER — ACETAMINOPHEN 325 MG PO TABS
650.0000 mg | ORAL_TABLET | Freq: Once | ORAL | Status: DC
  Filled 2017-03-09: qty 2

## 2017-03-09 NOTE — ED Notes (Signed)
Pt seen leaving by secretary after being told she would not receive any more narcotics.

## 2017-03-09 NOTE — ED Notes (Signed)
PT come out to the desk with her clothes on, and asked where the waiting room was located. And told me she was leaving

## 2017-03-09 NOTE — ED Notes (Signed)
Pt states that she does not want tylenol or anything else OTC because she is allergic to all of it

## 2017-03-15 ENCOUNTER — Ambulatory Visit (INDEPENDENT_AMBULATORY_CARE_PROVIDER_SITE_OTHER): Payer: Medicaid Other | Admitting: Family Medicine

## 2017-03-15 ENCOUNTER — Encounter: Payer: Self-pay | Admitting: Family Medicine

## 2017-03-15 VITALS — BP 92/60 | HR 90 | Temp 98.0°F | Resp 12 | Ht 64.0 in | Wt 73.2 lb

## 2017-03-15 DIAGNOSIS — R079 Chest pain, unspecified: Secondary | ICD-10-CM

## 2017-03-15 DIAGNOSIS — Z131 Encounter for screening for diabetes mellitus: Secondary | ICD-10-CM | POA: Diagnosis not present

## 2017-03-15 DIAGNOSIS — G894 Chronic pain syndrome: Secondary | ICD-10-CM

## 2017-03-15 DIAGNOSIS — F411 Generalized anxiety disorder: Secondary | ICD-10-CM

## 2017-03-15 DIAGNOSIS — F1320 Sedative, hypnotic or anxiolytic dependence, uncomplicated: Secondary | ICD-10-CM | POA: Diagnosis not present

## 2017-03-15 DIAGNOSIS — R9431 Abnormal electrocardiogram [ECG] [EKG]: Secondary | ICD-10-CM

## 2017-03-15 DIAGNOSIS — K861 Other chronic pancreatitis: Secondary | ICD-10-CM | POA: Diagnosis not present

## 2017-03-15 DIAGNOSIS — F1129 Opioid dependence with unspecified opioid-induced disorder: Secondary | ICD-10-CM

## 2017-03-15 DIAGNOSIS — M25551 Pain in right hip: Secondary | ICD-10-CM | POA: Diagnosis not present

## 2017-03-15 DIAGNOSIS — Z1331 Encounter for screening for depression: Secondary | ICD-10-CM

## 2017-03-15 DIAGNOSIS — E43 Unspecified severe protein-calorie malnutrition: Secondary | ICD-10-CM

## 2017-03-15 LAB — POCT URINALYSIS DIP (DEVICE)
BILIRUBIN URINE: NEGATIVE
GLUCOSE, UA: NEGATIVE mg/dL
KETONES UR: NEGATIVE mg/dL
Nitrite: NEGATIVE
PH: 5.5 (ref 5.0–8.0)
PROTEIN: NEGATIVE mg/dL
Specific Gravity, Urine: 1.015 (ref 1.005–1.030)
Urobilinogen, UA: 0.2 mg/dL (ref 0.0–1.0)

## 2017-03-15 LAB — POCT GLYCOSYLATED HEMOGLOBIN (HGB A1C): HEMOGLOBIN A1C: 5.5

## 2017-03-15 MED ORDER — PREDNISONE 20 MG PO TABS: 20.0000 mg | ORAL_TABLET | Freq: Every day | ORAL | 0 refills | Status: AC

## 2017-03-15 MED ORDER — TIOTROPIUM BROMIDE MONOHYDRATE 18 MCG IN CAPS: ORAL_CAPSULE | RESPIRATORY_TRACT | 5 refills | Status: DC

## 2017-03-15 MED ORDER — MOMETASONE FURO-FORMOTEROL FUM 200-5 MCG/ACT IN AERO: 2.0000 | INHALATION_SPRAY | Freq: Two times a day (BID) | RESPIRATORY_TRACT | 3 refills | Status: DC

## 2017-03-15 MED ORDER — LOSARTAN POTASSIUM 50 MG PO TABS: 50.0000 mg | ORAL_TABLET | Freq: Every day | ORAL | 1 refills | Status: DC

## 2017-03-15 NOTE — Patient Instructions (Addendum)
I have refilled medications as requested. I am placing referrals for you to cardiology, pain management, neuropsychiatric (evaluation of chronic anxiety), and [facility].  I have decreased your blood pressure medication Losartan 50 mg once daily due to low blood pressure.  I have prescribed prednisone 20 mg x 10 days due to persistent wheezing secondary to COPD.   I encourage small frequent meals and Ensure supplements to increase weight gain.       Malnutrition Malnutrition is any condition in which nutrition is poor. There are many forms of malnutrition. A common form is having too little of one kind of nutrient (nutritional deficiency). Nutrients include proteins, minerals, carbohydrates, fats, and vitamins. They provide the body with energy and keep the body working normally. Malnutrition ranges from mild to severe. The condition affects the body's defense system (immune system). Because of this, people who are malnourished are more likely to develop health problems and get sick. What are the causes? Causes of malnutrition include:  Eating an unbalanced diet.  Eating too much of certain foods.  Eating too little.  Conditions that decrease the body's ability to use nutrients.  What increases the risk? Risk factors include:  Pregnancy and lactation. Women who are pregnant may become malnourished if they do not increase their nutrient intake. They are also susceptible to folic acid deficiency.  Increasing age. The body's ability to absorb nutrients decreases with age. This can contribute to iron, calcium, and vitamin D deficiencies.  Alcohol or drug dependency. Addiction often leads to a lifestyle in which proper nourishment is ignored. Dependency can also hurt the metabolism and the body's ability to absorb nutrients. Alcoholism is a major cause of thiamine deficiency and can lead to deficiencies of magnesium, zinc, and other vitamins.  Eating disorders, such as  anorexia nervosa. People with these disorders may eat too little or too much.  Chewing or swallowing problems. People with these disorders may not eat enough.  Certain diseases, including: ? Long-lasting (chronic) diseases. Chronic diseases tend to affect the absorption of calcium, iron, and vitamins B12, A, D, E, and K. ? Liver disease. Liver disease affects the storage of vitamins A and B12. It also interferes with the metabolism of protein and energy sources. ? Kidney disease. Kidney disease may cause deficiencies of protein, iron, and vitamin D. ? Cancer or AIDS. These diseases can cause a loss of appetite. ? Cystic fibrosis. This disease can make it difficult for the body to absorb nutrients.  Certain diets, including. ? The vegetarian diet. Vegetarians are at risk for iron deficiency. ? The vegan diet. Vegans are susceptible to vitamin B12, calcium, iron, vitamin D, and zinc deficiencies. ? The fruitarian diet. This diet can be deficient in protein, sodium, and many micronutrients. ? Many commercial "fad" diets, including those that claim to enhance well-being and reduce weight. ? Very low calorie diets.  Low income. People with a low income may have trouble paying for nutritious foods.  What are the signs or symptoms? Signs and symptoms depend on the kind of malnutrition you have. Common symptoms include:  Fatigue.  Weakness.  Dizziness.  Fainting  Weight loss.  Poor immune response.  Lack of menstruation.  Hair loss.  Poor memory.  How is this diagnosed? Malnutrition may be diagnosed by:  A medical history.  A dietary history.  A physical exam. This may include a measurement of your body mass index (BMI).  Blood tests.  How is this treated? Treatments vary depending on the cause of  the malnutrition. Common treatments include:  Dietary changes.  Dietary supplements, such as vitamins and minerals.  Treatment of any underlying conditions.  Follow  these instructions at home:  Eat a balanced diet.  Take dietary supplements as directed by your health care provider.  Exercise regularly. Exercising can improve appetite.  Keep all follow-up visits as directed by your health care provider. This is important. How is this prevented? Eating a well-balanced diet helps to prevent most forms of malnutrition. Contact a health care provider if:  You have increased weakness or fatigue.  You faint.  You stop menstruating.  You have rapid hair loss.  You have unexpected weight loss. This information is not intended to replace advice given to you by your health care provider. Make sure you discuss any questions you have with your health care provider. Document Released: 11/06/2004 Document Revised: 05/29/2015 Document Reviewed: 08/17/2013 Elsevier Interactive Patient Education  [employer].

## 2017-03-15 NOTE — Progress Notes (Signed)
Patient ID: [name], female    DOB: 1950-04-27, 67 y.o.   MRN: [id number]  PCP: Scot Jun, FNP  Chief Complaint  Patient presents with  . Establish Care    Subjective:  HPI [name] is a 67 y.o. female with a history of opioid and benzodiazepine dependency, alcoholism, chronic pancreatitis, current daily smoker, hypertension, severe malnutrition, COPD, presents today to establish care. She is accompanied today by her husband who immediately star as she is been on for an extended period of time ting the visit by requesting refills of her hydrocodone and Ativan which was previously prescribed by Dr. Ricke Hey.  Patient apparently suffers from chronic pain secondary to chronic pancreatitis and frailty. Her current BMI is 12.6. She has a history of medication overdose and chronic alcoholism.  It is unclear as to why she is been prescribed Dilaudid over an extended period of time.  She reports no recent follow-up with gastroenterology for management of chronic pancreatitis. She also reports no prior evaluation by psychiatrist for medication management of generalized anxiety disorder.Anxiety has been managed for an extended period of time with benzodiazepines Xanax and Ativan.  Her husband insists that she cannot go without these medications.  She appears to be in very poor generalized health.  She reports that she rarely eats and mostly contains nutrition from soda intake.  She also complains today of persistent chest pain which she has not had any cardiology follow-up. She last presented to the emergency department with the same complaint on 03/08/2017 which cardiac etiology was ruled out. She denies any dizziness, worsening shortness of breath, lower extremity edema, or new weakness.  She is also being treated with 100 mg of losartan daily for blood pressure although during previous ED visits her blood pressure has consistently been less than 110/80. She is unaware as to why  she is taking blood pressure medications. COPD management provided by Dr. Chase Caller with last office visit occurring 05/2016.  Social History   Socioeconomic History  . Marital status: Married    Spouse name: Not on file  . Number of children: Not on file  . Years of education: Not on file  . Highest education level: Not on file  Social Needs  . Financial resource strain: Not on file  . Food insecurity - worry: Not on file  . Food insecurity - inability: Not on file  . Transportation needs - medical: Not on file  . Transportation needs - non-medical: Not on file  Occupational History  . Not on file  Tobacco Use  . Smoking status: Current Every Day Smoker    Packs/day: 1.00    Years: 30.00    Pack years: 30.00    Types: Cigarettes  . Smokeless tobacco: Never Used  . Tobacco comment: smoking up to 1.5 ppd per husband.   Substance and Sexual Activity  . Alcohol use: Yes    Alcohol/week: 0.0 oz  . Drug use: No    Comment: Hx of polysubstance drug abuse  . Sexual activity: No  Other Topics Concern  . Not on file  Social History Narrative  . Not on file    Family History  Problem Relation Age of Onset  . CAD Mother   . Alcoholism Father   . COPD Father    Review of Systems Pertinent negatives indicated in HPI Patient Active Problem List   Diagnosis Date Noted  . Hip fracture, right ([place]) 06/02/2016  . Narcotic addiction ([place]) 02/29/2016  .  Abdominal pain, chronic, epigastric- due to chronic pancreatitis 02/29/2016  . COPD (chronic obstructive pulmonary disease) ([place]) 02/28/2016  . Cough 09/03/2015  . Abdominal pain 01/03/2015  . Hyponatremia 01/03/2015  . Hypocalcemia 01/03/2015  . Underweight 01/03/2015  . COPD GOLD III with reversible component  07/17/2014  . DTs (delirium tremens) ([place]) 06/02/2013  . Chronic alcoholic pancreatitis ([place]) 06/02/2013  . Lactic acidosis 06/02/2013  . Recurrent acute pancreatitis 03/28/2013  . Alcohol withdrawal ([place]) 03/28/2013   . Pancreatitis 03/09/2013  . Tobacco abuse 03/09/2013  . Alcohol withdrawal syndrome with perceptual disturbance ([place]) 03/09/2013  . Acute alcoholic pancreatitis 75/64/3329  . Benzodiazepine withdrawal ([place]) 02/09/2013  . Protein-calorie malnutrition, severe ([place]) 02/09/2013  . Hypokalemia 02/27/2011  . Abdominal pain, acute 02/25/2011  . Nausea and vomiting 02/25/2011  . Thrombocytopenia ([place]) 02/25/2011  . COPD with acute exacerbation ([place]) 02/25/2011  . HTN (hypertension), malignant 02/25/2011  . KIDNEY CANCER 10/18/2008  . Anxiety state 10/18/2008  . GERD 10/18/2008  . CIRRHOSIS 10/18/2008  . PANCREATITIS, CHRONIC- atrophic pancreas 10/18/2008  . DYSPNEA 10/18/2008  . GASTRIC ULCER, HX OF 10/18/2008    Allergies  Allergen Reactions  . Aspirin Other (See Comments)    bleeding  . Chlorpromazine Hcl Other (See Comments)    Muscle spasms    Prior to Admission medications   Medication Sig Start Date End Date Taking? Authorizing Provider  albuterol (PROVENTIL HFA;VENTOLIN HFA) 108 (90 Base) MCG/ACT inhaler Inhale 2 puffs into the lungs every 6 (six) hours as needed for wheezing or shortness of breath. 02/27/15  Yes Juanito Doom, MD  CREON 12000 units CPEP capsule Take 12,000 Units by mouth 3 (three) times daily with meals. 08/16/16  Yes [provider]  DULERA 200-5 MCG/ACT AERO INHALE 2 PUFFS INTO THE LUNGS TWICE DAILY Patient taking differently: INHALE 2 PUFFS INTO THE LUNGS TWICE DAILY prn for wheezing 12/01/15  Yes McQuaid, Ronie Spies, MD  HYDROmorphone (DILAUDID) 4 MG tablet Take by mouth every 4 (four) hours as needed for severe pain.   Yes [provider]  ipratropium-albuterol (DUONEB) 0.5-2.5 (3) MG/3ML SOLN Take 3 mLs by nebulization every 6 (six) hours as needed (dyspnea not releived by inhaler). 02/29/16  Yes Debbe Odea, MD  LORazepam (ATIVAN) 1 MG tablet Take 1 tablet (1 mg total) by mouth every 6 (six) hours as needed. For anxiety. Patient  taking differently: Take 1 mg by mouth every 6 (six) hours as needed for anxiety. For anxiety. 04/01/13  Yes Isaac Bliss, Rayford Halsted, MD  losartan (COZAAR) 100 MG tablet Take 1 tablet (100 mg total) by mouth daily. 09/03/15  Yes Parrett, Tammy S, NP  metroNIDAZOLE (FLAGYL) 500 MG tablet Take 1 tablet (500 mg total) by mouth 3 (three) times daily. 51/8/84  Yes Delora Fuel, MD  omeprazole (PRILOSEC) 20 MG capsule Take 1 capsule (20 mg total) by mouth daily. 01/15/17  Yes Rancour, Annie Main, MD  ondansetron (ZOFRAN ODT) 4 MG disintegrating tablet Take 1 tablet (4 mg total) by mouth every 8 (eight) hours as needed for nausea or vomiting. 08/10/16  Yes Duffy Bruce, MD  ciprofloxacin (CIPRO) 500 MG tablet Take 1 tablet (500 mg total) by mouth 2 (two) times daily. Patient not taking: Reported on 1/66/0630 16/0/10   Delora Fuel, MD  [facility] HANDIHALER 18 MCG inhalation capsule INHALE 1 CAPSULE VIA HANDIHALER ONCE DAILY Patient not taking: Reported on 03/15/2017 11/07/15   Juanito Doom, MD    Past Medical, Surgical Family and Social History reviewed and updated.  Objective:   Today's Vitals   03/15/17 0837  BP: 92/60  Pulse: 90  Resp: 12  Temp: 98 F (36.7 C)  TempSrc: Oral  SpO2: 100%  Weight: 73 lb 3.2 oz (33.2 kg)  Height: 5\' 4"  (1.626 m)    Wt Readings from Last 3 Encounters:  03/15/17 73 lb 3.2 oz (33.2 kg)  01/15/17 76 lb (34.5 kg)  09/11/16 80 lb (36.3 kg)    Physical Exam  Constitutional: She is oriented to person, place, and time. She appears cachectic. She appears ill.  HENT:  Nose: Nose normal.  Mouth/Throat: Oropharynx is clear and moist.  Eyes: Conjunctivae are normal. Pupils are equal, round, and reactive to light.  Neck: No thyromegaly present.  Cardiovascular: Normal rate, regular rhythm, normal heart sounds and intact distal pulses.  Pulmonary/Chest: She has wheezes.  Abdominal: There is no tenderness.  Lymphadenopathy:    She has no cervical adenopathy.   Neurological: She is alert and oriented to person, place, and time.  Slowed movements with ambulation and motor movement   Skin: There is pallor.  Psychiatric: She has a normal mood and affect. Her speech is normal and behavior is normal. Judgment and thought content normal. Cognition and memory are impaired.    Assessment & Plan:  1. Chest pain, unspecified type, suspect this is a combination of anxiety induced and secondary to chronic COPD. EKG was significant for NSR, and right atrial enlargement. Will refer to cardiology for second opinion and evaluation of what appears to be chronic on-going chest pain.   2. Right hip pain, prior fracture referring patient to orthopedics for further evaluation as she reports fracture was managed by ED and prior PCP. Ambulatory referral to Orthopedic Surgery.  3. Screening for diabetes mellitus, A1C 5.5,normal. Repeat A1C in 12 months.   4. Chronic pancreatitis, unspecified pancreatitis type ([place]), unspecified chronicity. No recent GI follow-up. Condition previously management by PCP. Referring patient to Gastroenterology and Pain Clinic  5. Abnormal EKG, right atrial enlargement. Referring to cardiology for further evaluation and management of symptoms.   6. Severe malnutrition ([place]), recommended nutritional supplements in addition to 4-6 small meals per day in efforts to increase body mass.  7. Chronic pain syndrome, Ambulatory referral to [facility]   8. Positive depression screening 9. GAD (generalized anxiety disorder)-Declined to continue benzodiazepines. Patient is high risk for overdose and has a history of chronic alcoholism. Referring patient to [facility] for further evaluation and management of both depression and anxiety. Patient declined SSRI.  10. Hypotension, decreased losartan 50 mg once daily. If BP remains low, will discontinue losartan completely.    11. COPD,wheezing  auscultated throughout lung fields. Concern for an acute exacerbation. Recommend resumption of bronchodilators and will prescribe a course of prednisone  20 mg once daily x 10 days.    Patient and her spouse were advised that patient would not receive any opioid or benzodiazepines from practice and it is unclear as to why patient's previous provider "Ricke Hey" had her on such high doses of pain medication with a past history of chronic depression, hx of medication overdose, and alcoholism. Patient is in overall very poor medical health with a BMI 12.6 indicating severe malnourishment. Spouse was very persistent that patient needs her opioids as patient was understanding and receptive of information provided as to why medications would not be prescribed. The patient filled her last prescription of Dilaudid 02/16/2017 which was a qty 120 tablets and Ativan 02/10/2017  180 tablets. Husband reports that she has been out of medications however, she filled enough of a quantity to have medication available through today. She has a history of medication misuse. Patient was offered other medication for indication of treatment of anxiety, although declined as medication were not benzodiazepines.  I am referring patient to Pain management and Neuropsychiatric for further evaluation and management of pain and anxiety with benzo dependency disorder.   Patient referred to [facility] and a response was immediately generated indicating that patient had been dismissed from the practice.   Meds ordered this encounter  Medications  . tiotropium (SPIRIVA HANDIHALER) 18 MCG inhalation capsule    Sig: INHALE 1 CAPSULE VIA HANDIHALER ONCE DAILY    Dispense:  30 capsule    Refill:  5    Order Specific Question:   Supervising Provider    Answer:   Tresa Garter [id number]  . mometasone-formoterol (DULERA) 200-5 MCG/ACT AERO    Sig: Inhale 2 puffs into the lungs 2 (two) times daily.    Dispense:  39 g    Refill:   3    **Patient requests 90 days supply**    Order Specific Question:   Supervising Provider    Answer:   Tresa Garter [id number]  . DISCONTD: losartan (COZAAR) 50 MG tablet    Sig: Take 1 tablet (50 mg total) by mouth daily.    Dispense:  90 tablet    Refill:  1    Order Specific Question:   Supervising Provider    Answer:   Tresa Garter [id number]  . predniSONE (DELTASONE) 20 MG tablet    Sig: Take 1 tablet (20 mg total) by mouth daily with breakfast for 10 days.    Dispense:  10 tablet    Refill:  0    Order Specific Question:   Supervising Provider    Answer:   Tresa Garter [id number]    Orders Placed This Encounter  Procedures  . Ambulatory referral to Gastroenterology  . Ambulatory referral to Cardiology  . Ambulatory referral to Orthopedic Surgery  . Ambulatory referral to Pain Clinic  . Ambulatory referral to Psychiatry  . POCT glycosylated hemoglobin (Hb A1C)  . POCT urinalysis dip (device)  . EKG 12-Lead    Carroll Sage. Kenton Kingfisher, MSN, FNP-C [facility]  [address] C[address] [place],  [address] [phone]

## 2017-03-17 ENCOUNTER — Other Ambulatory Visit: Payer: Self-pay

## 2017-03-17 MED ORDER — LOSARTAN POTASSIUM 50 MG PO TABS: 50.0000 mg | ORAL_TABLET | Freq: Every day | ORAL | 1 refills | Status: DC

## 2017-03-17 NOTE — Telephone Encounter (Signed)
Medication was resent to pharmacy again as patient husband states they didn't receive script. Patient husband came in office wanting to know why referral has not been sent to pain management and was told that once notes are complete they will be sent to pain management. Patient husband was directed to our Clinic Manager to answer further questions.

## 2017-03-18 ENCOUNTER — Encounter (HOSPITAL_COMMUNITY): Payer: Self-pay | Admitting: Emergency Medicine

## 2017-03-18 ENCOUNTER — Other Ambulatory Visit: Payer: Self-pay

## 2017-03-18 ENCOUNTER — Encounter (HOSPITAL_COMMUNITY): Payer: Self-pay

## 2017-03-18 ENCOUNTER — Emergency Department (HOSPITAL_COMMUNITY)
Admission: EM | Admit: 2017-03-18 | Discharge: 2017-03-19 | Disposition: A | Discharge status: Home / Self Care | Payer: Medicaid Other | Attending: Emergency Medicine | Admitting: Emergency Medicine

## 2017-03-18 DIAGNOSIS — J449 Chronic obstructive pulmonary disease, unspecified: Secondary | ICD-10-CM | POA: Diagnosis not present

## 2017-03-18 DIAGNOSIS — F17210 Nicotine dependence, cigarettes, uncomplicated: Secondary | ICD-10-CM | POA: Diagnosis not present

## 2017-03-18 DIAGNOSIS — R1084 Generalized abdominal pain: Secondary | ICD-10-CM | POA: Insufficient documentation

## 2017-03-18 DIAGNOSIS — Z85528 Personal history of other malignant neoplasm of kidney: Secondary | ICD-10-CM | POA: Insufficient documentation

## 2017-03-18 DIAGNOSIS — Z79899 Other long term (current) drug therapy: Secondary | ICD-10-CM | POA: Diagnosis not present

## 2017-03-18 DIAGNOSIS — I10 Essential (primary) hypertension: Secondary | ICD-10-CM | POA: Insufficient documentation

## 2017-03-18 LAB — CBC WITH DIFFERENTIAL/PLATELET
BASOS ABS: 0.1 10*3/uL (ref 0.0–0.1)
Basophils Relative: 1 %
EOS ABS: 0.1 10*3/uL (ref 0.0–0.7)
EOS PCT: 2 %
HCT: 35.2 % — ABNORMAL LOW (ref 36.0–46.0)
Hemoglobin: 11.8 g/dL — ABNORMAL LOW (ref 12.0–15.0)
Lymphocytes Relative: 22 %
Lymphs Abs: 1.6 10*3/uL (ref 0.7–4.0)
MCH: 30.3 pg (ref 26.0–34.0)
MCHC: 33.5 g/dL (ref 30.0–36.0)
MCV: 90.3 fL (ref 78.0–100.0)
MONO ABS: 0.5 10*3/uL (ref 0.1–1.0)
Monocytes Relative: 7 %
Neutro Abs: 5.1 10*3/uL (ref 1.7–7.7)
Neutrophils Relative %: 68 %
Platelets: 235 10*3/uL (ref 150–400)
RBC: 3.9 MIL/uL (ref 3.87–5.11)
RDW: 14.5 % (ref 11.5–15.5)
WBC: 7.4 10*3/uL (ref 4.0–10.5)

## 2017-03-18 LAB — COMPREHENSIVE METABOLIC PANEL
ALT: 11 U/L — AB (ref 14–54)
AST: 13 U/L — AB (ref 15–41)
Albumin: 3.7 g/dL (ref 3.5–5.0)
Alkaline Phosphatase: 97 U/L (ref 38–126)
Anion gap: 11 (ref 5–15)
BUN: 7 mg/dL (ref 6–20)
CO2: 24 mmol/L (ref 22–32)
CREATININE: 0.78 mg/dL (ref 0.44–1.00)
Calcium: 9.3 mg/dL (ref 8.9–10.3)
Chloride: 103 mmol/L (ref 101–111)
Glucose, Bld: 83 mg/dL (ref 65–99)
POTASSIUM: 3.6 mmol/L (ref 3.5–5.1)
SODIUM: 138 mmol/L (ref 135–145)
Total Bilirubin: 0.5 mg/dL (ref 0.3–1.2)
Total Protein: 6.9 g/dL (ref 6.5–8.1)

## 2017-03-18 LAB — ETHANOL

## 2017-03-18 LAB — LIPASE, BLOOD: LIPASE: 18 U/L (ref 11–51)

## 2017-03-18 MED ORDER — HYDROMORPHONE HCL 1 MG/ML IJ SOLN
1.0000 mg | Freq: Once | INTRAMUSCULAR | Status: AC
  Administered 2017-03-18: 1 mg via INTRAMUSCULAR
  Filled 2017-03-18: qty 1

## 2017-03-18 MED ORDER — ONDANSETRON 4 MG PO TBDP
4.0000 mg | ORAL_TABLET | Freq: Once | ORAL | Status: AC
  Administered 2017-03-18: 4 mg via ORAL
  Filled 2017-03-18: qty 1

## 2017-03-18 NOTE — ED Triage Notes (Signed)
Pt brought in by EMS from home with c/o lower abdominal pain, no N/V/D----- hx of chronic pancreatitis.  Pt reports that she has had not taken any pain medication for a week now.  Pt also reports that she is off her BP medication for 2 days now, per her physician's directive until her pain medication is controlled.

## 2017-03-18 NOTE — ED Provider Notes (Signed)
Care assumed from  Alyse Low, PA-C at shift change with labs pending.  In brief, this patient is a 67 y.o. F with past medical history of chronic pancreatitis, chronic abdominal pain.  She would receive p.o. Dilaudid, Ativan from her primary care doctor.  Please see note from previous provider for full history and physical.  PLAN: Patient with labs pending.  If labs are consistent with pink otitis, consider CT abdomen pelvis for further evaluation.  If labs are unremarkable, plan to discharge patient home.  MDM:  Patient asked for additional analgesics here in the department.  Patient just received 1 of Dilaudid approximately 1 hour ago.  Instructed patient that she cannot have any more pain medication at this time.  Labs reviewed.  Lipase is unremarkable.  Ethanol is without any acute abnormalities.  Rapid urine drug screen shows positive for opiates and benzos.  CBC shows slight anemia.  Records reviewed to this consistent with previous.  No evidence of leukocytosis.  CMP unremarkable.   Patient given additional analgesics in the department.  Reevaluation.  Patient reports improvement in pain after analgesics.  Repeat abdominal exam shows no tenderness.  No rigidity, guarding, peritoneal signs.  Exam is not concerning for any acute infectious etiology.  She is ambulating in the part without any difficulty.  Patient is tolerating p.o. in the department without any difficulty.  Patient reports that she has run out of her prescriptions for her chronic pain medication.  I informed patient that we would not refill prescriptions for narcotic chronic pain medications.  I instructed her that she should follow-up with her primary care doctor or follow-up with the [facility] wellness clinic for those needs. Patient had ample opportunity for questions and discussion. All patient's questions were answered with full understanding. Strict return precautions discussed. Patient expresses understanding and agreement to  plan.     1. Generalized abdominal pain       Desma Mcgregor 03/19/17 [place], April, MD 03/19/17 [phone]

## 2017-03-18 NOTE — ED Notes (Signed)
Pt requested oxygen because she uses it at home when she feels like this. Her oxygen saturation is between 95-97%.

## 2017-03-18 NOTE — ED Provider Notes (Signed)
[facility] DEPT Provider Note   CSN: [id number] Arrival date & time: 03/18/17  2007     History   Chief Complaint Chief Complaint  Patient presents with  . Abdominal Pain    HPI [name] is a 67 y.o. female.  The history is provided by the patient. No language interpreter was used.  Abdominal Pain   This is a new problem. The current episode started more than 1 week ago. The problem occurs constantly. The problem has been gradually worsening. The pain is associated with eating. The pain is located in the generalized abdominal region. The pain is moderate. Pertinent negatives include vomiting. Nothing aggravates the symptoms. Nothing relieves the symptoms.   Pt reports she has pancreatitis. Pt reports she thinks her pancreatitis is flaring up. Pt has chronic abdominal pain.  Pt reports she was on pain medication but her Doctor is no longer practicing.  Past Medical History:  Diagnosis Date  . Cancer ([place])    renal ca  . COPD (chronic obstructive pulmonary disease) ([place])   . Drug-seeking behavior   . Pancreatitis   . Pancreatitis   . Seizures ([place])   . Substance abuse [facility])     Patient Active Problem List   Diagnosis Date Noted  . Hip fracture, right ([place]) 06/02/2016  . Narcotic addiction ([place]) 02/29/2016  . Abdominal pain, chronic, epigastric- due to chronic pancreatitis 02/29/2016  . COPD (chronic obstructive pulmonary disease) ([place]) 02/28/2016  . Cough 09/03/2015  . Abdominal pain 01/03/2015  . Hyponatremia 01/03/2015  . Hypocalcemia 01/03/2015  . Underweight 01/03/2015  . COPD GOLD III with reversible component  07/17/2014  . DTs (delirium tremens) ([place]) 06/02/2013  . Chronic alcoholic pancreatitis ([place]) 06/02/2013  . Lactic acidosis 06/02/2013  . Recurrent acute pancreatitis 03/28/2013  . Alcohol withdrawal ([place]) 03/28/2013  . Pancreatitis 03/09/2013  . Tobacco abuse 03/09/2013  . Alcohol withdrawal syndrome with  perceptual disturbance ([place]) 03/09/2013  . Acute alcoholic pancreatitis 14/43/1540  . Benzodiazepine withdrawal ([place]) 02/09/2013  . Protein-calorie malnutrition, severe ([place]) 02/09/2013  . Hypokalemia 02/27/2011  . Abdominal pain, acute 02/25/2011  . Nausea and vomiting 02/25/2011  . Thrombocytopenia ([place]) 02/25/2011  . COPD with acute exacerbation ([place]) 02/25/2011  . HTN (hypertension), malignant 02/25/2011  . KIDNEY CANCER 10/18/2008  . Anxiety state 10/18/2008  . GERD 10/18/2008  . CIRRHOSIS 10/18/2008  . PANCREATITIS, CHRONIC- atrophic pancreas 10/18/2008  . DYSPNEA 10/18/2008  . GASTRIC ULCER, HX OF 10/18/2008    Past Surgical History:  Procedure Laterality Date  . IR GENERIC HISTORICAL  05/08/2015   IR RADIOLOGIST EVAL & MGMT 05/08/2015 Aletta Edouard, MD [facility] INTERV RAD  . IR GENERIC HISTORICAL  01/08/2014   IR RADIOLOGIST EVAL & MGMT 01/08/2014 Aletta Edouard, MD [facility] INTERV RAD  . KIDNEY SURGERY     removed cancerous lesions  . PARTIAL GASTRECTOMY      OB History    No data available       Home Medications    Prior to Admission medications   Medication Sig Start Date End Date Taking? Authorizing Provider  albuterol (PROVENTIL HFA;VENTOLIN HFA) 108 (90 Base) MCG/ACT inhaler Inhale 2 puffs into the lungs every 6 (six) hours as needed for wheezing or shortness of breath. 02/27/15   Juanito Doom, MD  ciprofloxacin (CIPRO) 500 MG tablet Take 1 tablet (500 mg total) by mouth 2 (two) times daily. Patient not taking: Reported on 0/86/7619 50/9/32   Delora Fuel, MD  CREON 12000 units CPEP  capsule Take 12,000 Units by mouth 3 (three) times daily with meals. 08/16/16   [provider]  HYDROmorphone (DILAUDID) 4 MG tablet Take by mouth every 4 (four) hours as needed for severe pain.    [provider]  ipratropium-albuterol (DUONEB) 0.5-2.5 (3) MG/3ML SOLN Take 3 mLs by nebulization every 6 (six) hours as needed (dyspnea not releived by inhaler). 02/29/16    Debbe Odea, MD  LORazepam (ATIVAN) 1 MG tablet Take 1 tablet (1 mg total) by mouth every 6 (six) hours as needed. For anxiety. Patient taking differently: Take 1 mg by mouth every 6 (six) hours as needed for anxiety. For anxiety. 04/01/13   Isaac Bliss, Rayford Halsted, MD  losartan (COZAAR) 50 MG tablet Take 1 tablet (50 mg total) by mouth daily. 03/17/17   Scot Jun, FNP  metroNIDAZOLE (FLAGYL) 500 MG tablet Take 1 tablet (500 mg total) by mouth 3 (three) times daily. 93/8/10   Delora Fuel, MD  mometasone-formoterol [facility]) 200-5 MCG/ACT AERO Inhale 2 puffs into the lungs 2 (two) times daily. 03/15/17   Scot Jun, FNP  omeprazole (PRILOSEC) 20 MG capsule Take 1 capsule (20 mg total) by mouth daily. 01/15/17   Rancour, Annie Main, MD  ondansetron (ZOFRAN ODT) 4 MG disintegrating tablet Take 1 tablet (4 mg total) by mouth every 8 (eight) hours as needed for nausea or vomiting. 08/10/16   Duffy Bruce, MD  predniSONE (DELTASONE) 20 MG tablet Take 1 tablet (20 mg total) by mouth daily with breakfast for 10 days. 03/15/17 03/25/17  Scot Jun, FNP  tiotropium (SPIRIVA HANDIHALER) 18 MCG inhalation capsule INHALE 1 CAPSULE VIA HANDIHALER ONCE DAILY 03/15/17   Scot Jun, FNP    Family History Family History  Problem Relation Age of Onset  . CAD Mother   . Alcoholism Father   . COPD Father     Social History Social History   Tobacco Use  . Smoking status: Current Every Day Smoker    Packs/day: 1.00    Years: 30.00    Pack years: 30.00    Types: Cigarettes  . Smokeless tobacco: Never Used  . Tobacco comment: smoking up to 1.5 ppd per husband.   Substance Use Topics  . Alcohol use: Yes    Alcohol/week: 0.0 oz  . Drug use: No    Comment: Hx of polysubstance drug abuse     Allergies   Aspirin and Chlorpromazine hcl   Review of Systems Review of Systems  Gastrointestinal: Positive for abdominal pain. Negative for vomiting.  All other systems reviewed  and are negative.    Physical Exam Updated Vital Signs BP (!) 166/96 (BP Location: Right Arm)   Pulse 87   Temp 98.6 F (37 C) (Oral)   Resp (!) 24   SpO2 100%   Physical Exam  Constitutional: She appears well-developed and well-nourished. No distress.  HENT:  Head: Normocephalic and atraumatic.  Eyes: Conjunctivae are normal.  Neck: Neck supple.  Cardiovascular: Normal rate and regular rhythm.  No murmur heard. Pulmonary/Chest: Effort normal and breath sounds normal. No respiratory distress.  Abdominal: Soft. Bowel sounds are normal. There is generalized tenderness.  Musculoskeletal: She exhibits no edema.  Neurological: She is alert.  Skin: Skin is warm and dry.  Psychiatric: She has a normal mood and affect.  Nursing note and vitals reviewed.    ED Treatments / Results  Labs (all labs ordered are listed, but only abnormal results are displayed) Labs Reviewed  CBC WITH DIFFERENTIAL/PLATELET  COMPREHENSIVE METABOLIC PANEL  LIPASE, BLOOD  ETHANOL  RAPID URINE DRUG SCREEN, HOSP PERFORMED    EKG  EKG Interpretation None       Radiology No results found.  Procedures Procedures (including critical care time)  Medications Ordered in ED Medications  HYDROmorphone (DILAUDID) injection 1 mg (1 mg Intramuscular Given 03/18/17 2151)  ondansetron (ZOFRAN-ODT) disintegrating tablet 4 mg (4 mg Oral Given 03/18/17 2151)     Initial Impression / Assessment and Plan / ED Course  I have reviewed the triage vital signs and the nursing notes.  Pertinent labs & imaging results that were available during my care of the patient were reviewed by me and considered in my medical decision making (see chart for details).     Pt's care turned over to [facility] at 11pm.  Final Clinical Impressions(s) / ED Diagnoses   Final diagnoses:  Generalized abdominal pain    ED Discharge Orders    None       Sidney Ace 03/18/17 2255    Fredia Sorrow,  MD 03/19/17 0020

## 2017-03-18 NOTE — Progress Notes (Signed)
Patients husband, Gerilynn Mccullars, visited the [facility] yesterday 3/14 around 1030 AM with concerns for his wife.  He stated she had not received any prescriptions for her opiates or benzodiazepine medications during her visit with Lavell Anchors, FNP-C on 3/12.  He elaborated that they were waiting on a pain management consult and was unsure when this would happen.  I offered to speak to our medical director and call Mr. Reddick back later today.  He was agreeable to this plan.  Upon speaking to Dr. Ned Clines I learned that our providers would not be prescribing opiates or benzodiazepine medications outside of the sickle cell and oncology patient population.  I called Mr. Dunsworth back and shared this information.  He stated he was appreciative of the follow up;however he was frustrated at  "getting the run around".  He shared his wife has seen Dr. Katherine Roan in the past; however, when he contacted his office he was informed they would not prescribe opiates. He stated that his wife was having difficulty not having her medications and I recommended taking her to the Emergency Room should her condition worsen.  He replied "they just give her a shot and send her back home".   He also requested to be the primary contact as his wife was unable to understand information given to her regarding appointments, etc. I recommended a health care power of attorney to achieve this on a permanent basis, to which he replied "they always say that but she is agreeable for me to get the information."    Patient has pending referral to pain clinic and husband received complete appointment information for upcoming cardiology referral.

## 2017-03-19 ENCOUNTER — Telehealth: Payer: Self-pay | Admitting: Family Medicine

## 2017-03-19 LAB — RAPID URINE DRUG SCREEN, HOSP PERFORMED
Amphetamines: NOT DETECTED
Barbiturates: NOT DETECTED
Benzodiazepines: POSITIVE — AB
Cocaine: NOT DETECTED
Opiates: POSITIVE — AB
Tetrahydrocannabinol: NOT DETECTED

## 2017-03-19 MED ORDER — HYDROMORPHONE HCL 1 MG/ML IJ SOLN
0.5000 mg | Freq: Once | INTRAMUSCULAR | Status: AC
  Administered 2017-03-19: 0.5 mg via INTRAVENOUS
  Filled 2017-03-19: qty 1

## 2017-03-19 NOTE — Telephone Encounter (Signed)
Process referrals to Pain management comprehensive pain management and [facility]. Also for gastroenterology attempt to refer patient to [facility] physicians if they've previously management patient for chronic pancreatitis and are unwilling to see patient, refer her to gastroenterology at [facility] in [facility].   Carroll Sage. Kenton Kingfisher, MSN, FNP-C [facility]  [address] [place], [place] [address] [phone]

## 2017-03-19 NOTE — Discharge Instructions (Signed)
As we discussed, you will need to follow-up with [facility] for further evaluation and establishing a primary care doctor.  Return to the Emergency Department immediately if you experience any worsening abdominal pain, fever, persistent nausea and vomiting, inability keep any food down, pain with urination, blood in your urine or any other worsening or concerning symptoms.

## 2017-03-21 ENCOUNTER — Telehealth: Payer: Self-pay | Admitting: Family Medicine

## 2017-03-21 NOTE — Telephone Encounter (Signed)
Complete the highlighted areas of the form request for PCA and fax as indicated.

## 2017-03-22 ENCOUNTER — Ambulatory Visit: Payer: Medicaid Other | Admitting: Pulmonary Disease

## 2017-03-22 NOTE — Telephone Encounter (Signed)
Referrals faxed. 

## 2017-03-22 NOTE — Telephone Encounter (Signed)
Form completed and faxed. 

## 2017-03-22 NOTE — Progress Notes (Deleted)
Subjective:    Patient ID: [name], female    DOB: 1950-07-06, 67 y.o.   MRN: [id number]  Synopsis: Gold grade 4 COPD Still smoking as of 09/02/2014    HPI No chief complaint on file.  ***   Past Medical History:  Diagnosis Date  . Cancer ([place])    renal ca  . COPD (chronic obstructive pulmonary disease) ([place])   . Drug-seeking behavior   . Pancreatitis   . Pancreatitis   . Seizures ([place])   . Substance abuse ([place])       Review of Systems  Constitutional: Positive for fatigue. Negative for chills and fever.  HENT: Negative for nosebleeds, postnasal drip and rhinorrhea.   Respiratory: Positive for cough, shortness of breath and wheezing.   Cardiovascular: Negative for chest pain, palpitations and leg swelling.       Objective:   Physical Exam There were no vitals filed for this visit.   RA  ***  Last CXR 10/2015 > showed emphysema  CBC    Component Value Date/Time   WBC 7.4 03/18/2017 2148   RBC 3.90 03/18/2017 2148   HGB 11.8 (L) 03/18/2017 2148   HCT 35.2 (L) 03/18/2017 2148   PLT 235 03/18/2017 2148   MCV 90.3 03/18/2017 2148   MCH 30.3 03/18/2017 2148   MCHC 33.5 03/18/2017 2148   RDW 14.5 03/18/2017 2148   LYMPHSABS 1.6 03/18/2017 2148   MONOABS 0.5 03/18/2017 2148   EOSABS 0.1 03/18/2017 2148   BASOSABS 0.1 03/18/2017 2148    PFT 07/17/2014 FEV1 0.95 ((36%)  Ratio 49 p 25% resp to saba  DLCO 49%      Assessment & Plan:   No diagnosis found.  Discussion: ***    Current Outpatient Medications:  .  albuterol (PROVENTIL HFA;VENTOLIN HFA) 108 (90 Base) MCG/ACT inhaler, Inhale 2 puffs into the lungs every 6 (six) hours as needed for wheezing or shortness of breath., Disp: 1 Inhaler, Rfl: 5 .  ciprofloxacin (CIPRO) 500 MG tablet, Take 1 tablet (500 mg total) by mouth 2 (two) times daily. (Patient not taking: Reported on 01/15/2017), Disp: 20 tablet, Rfl: 0 .  CREON 12000 units CPEP capsule, Take 12,000 Units by mouth 3 (three) times  daily with meals., Disp: , Rfl: 0 .  HYDROmorphone (DILAUDID) 4 MG tablet, Take by mouth every 4 (four) hours as needed for severe pain., Disp: , Rfl:  .  ipratropium-albuterol (DUONEB) 0.5-2.5 (3) MG/3ML SOLN, Take 3 mLs by nebulization every 6 (six) hours as needed (dyspnea not releived by inhaler)., Disp: 360 mL, Rfl: 0 .  LORazepam (ATIVAN) 1 MG tablet, Take 1 tablet (1 mg total) by mouth every 6 (six) hours as needed. For anxiety. (Patient taking differently: Take 1 mg by mouth every 6 (six) hours as needed for anxiety. For anxiety.), Disp: 10 tablet, Rfl: 0 .  losartan (COZAAR) 50 MG tablet, Take 1 tablet (50 mg total) by mouth daily., Disp: 90 tablet, Rfl: 1 .  metroNIDAZOLE (FLAGYL) 500 MG tablet, Take 1 tablet (500 mg total) by mouth 3 (three) times daily., Disp: 30 tablet, Rfl: 0 .  mometasone-formoterol (DULERA) 200-5 MCG/ACT AERO, Inhale 2 puffs into the lungs 2 (two) times daily., Disp: 39 g, Rfl: 3 .  omeprazole (PRILOSEC) 20 MG capsule, Take 1 capsule (20 mg total) by mouth daily., Disp: 30 capsule, Rfl: 0 .  ondansetron (ZOFRAN ODT) 4 MG disintegrating tablet, Take 1 tablet (4 mg total) by mouth every 8 (eight) hours as needed  for nausea or vomiting., Disp: 12 tablet, Rfl: 0 .  predniSONE (DELTASONE) 20 MG tablet, Take 1 tablet (20 mg total) by mouth daily with breakfast for 10 days., Disp: 10 tablet, Rfl: 0 .  tiotropium (SPIRIVA HANDIHALER) 18 MCG inhalation capsule, INHALE 1 CAPSULE VIA HANDIHALER ONCE DAILY, Disp: 30 capsule, Rfl: 5

## 2017-03-25 ENCOUNTER — Ambulatory Visit (INDEPENDENT_AMBULATORY_CARE_PROVIDER_SITE_OTHER): Payer: Medicaid Other | Admitting: Orthopaedic Surgery

## 2017-03-25 ENCOUNTER — Ambulatory Visit (INDEPENDENT_AMBULATORY_CARE_PROVIDER_SITE_OTHER): Payer: Medicaid Other

## 2017-03-25 ENCOUNTER — Encounter (INDEPENDENT_AMBULATORY_CARE_PROVIDER_SITE_OTHER): Payer: Self-pay | Admitting: Orthopaedic Surgery

## 2017-03-25 ENCOUNTER — Telehealth: Payer: Self-pay

## 2017-03-25 VITALS — BP 111/77 | HR 91 | Resp 18 | Ht 64.0 in | Wt 72.0 lb

## 2017-03-25 DIAGNOSIS — M25551 Pain in right hip: Secondary | ICD-10-CM | POA: Diagnosis not present

## 2017-03-25 DIAGNOSIS — M5441 Lumbago with sciatica, right side: Secondary | ICD-10-CM

## 2017-03-25 DIAGNOSIS — G8929 Other chronic pain: Secondary | ICD-10-CM

## 2017-03-25 DIAGNOSIS — M4807 Spinal stenosis, lumbosacral region: Secondary | ICD-10-CM

## 2017-03-25 NOTE — Telephone Encounter (Signed)
-----   Message from Dorena Dew, [place] sent at 03/25/2017  8:18 AM EDT ----- Please contact [facility] gastroenterology regarding referral. Attempted to contact our office through the on-call service on 03/24/2017.   Donia Pounds  MSN, FNP-C [facility] [address] [place], [place] [address] [phone]

## 2017-03-25 NOTE — Telephone Encounter (Signed)
Already spoke with [facility] and faxed over notes they requested

## 2017-03-25 NOTE — Progress Notes (Deleted)
Office Visit Note   Patient: [name]           Date of Birth: 06-Dec-1950           MRN: [id number] Visit Date: 03/25/2017              Requested by: Scot Jun, [place], [place] [address] PCP: Scot Jun, FNP   Assessment & Plan: Visit Diagnoses: No diagnosis found.  Plan: ***  Follow-Up Instructions: No follow-ups on file.   Orders:  No orders of the defined types were placed in this encounter.  No orders of the defined types were placed in this encounter.     Procedures: No procedures performed   Clinical Data: No additional findings.   Subjective: No chief complaint on file.   HPI  Review of Systems  Constitutional: Positive for fatigue. Negative for fever.  HENT: Negative for ear pain.   Eyes: Positive for pain.  Respiratory: Positive for cough and shortness of breath.   Cardiovascular: Negative for leg swelling.  Gastrointestinal: Negative for blood in stool, constipation and diarrhea.  Genitourinary: Positive for difficulty urinating.  Musculoskeletal: Positive for back pain. Negative for neck pain.  Skin: Negative for rash and wound.  Allergic/Immunologic: Negative for food allergies.  Neurological: Positive for dizziness, weakness, light-headedness and numbness. Negative for headaches.  Hematological: Bruises/bleeds easily.     Objective: Vital Signs: There were no vitals taken for this visit.  Physical Exam  Ortho Exam  Specialty Comments:  No specialty comments available.  Imaging: No results found.   PMFS History: Patient Active Problem List   Diagnosis Date Noted  . Hip fracture, right ([place]) 06/02/2016  . Narcotic addiction ([place]) 02/29/2016  . Abdominal pain, chronic, epigastric- due to chronic pancreatitis 02/29/2016  . COPD (chronic obstructive pulmonary disease) ([place]) 02/28/2016  . Cough 09/03/2015  . Abdominal pain 01/03/2015  . Hyponatremia 01/03/2015  . Hypocalcemia 01/03/2015    . Underweight 01/03/2015  . COPD GOLD III with reversible component  07/17/2014  . DTs (delirium tremens) ([place]) 06/02/2013  . Chronic alcoholic pancreatitis ([place]) 06/02/2013  . Lactic acidosis 06/02/2013  . Recurrent acute pancreatitis 03/28/2013  . Alcohol withdrawal ([place]) 03/28/2013  . Pancreatitis 03/09/2013  . Tobacco abuse 03/09/2013  . Alcohol withdrawal syndrome with perceptual disturbance ([place]) 03/09/2013  . Acute alcoholic pancreatitis 32/44/0102  . Benzodiazepine withdrawal ([place]) 02/09/2013  . Protein-calorie malnutrition, severe ([place]) 02/09/2013  . Hypokalemia 02/27/2011  . Abdominal pain, acute 02/25/2011  . Nausea and vomiting 02/25/2011  . Thrombocytopenia ([place]) 02/25/2011  . COPD with acute exacerbation ([place]) 02/25/2011  . HTN (hypertension), malignant 02/25/2011  . KIDNEY CANCER 10/18/2008  . Anxiety state 10/18/2008  . GERD 10/18/2008  . CIRRHOSIS 10/18/2008  . PANCREATITIS, CHRONIC- atrophic pancreas 10/18/2008  . DYSPNEA 10/18/2008  . GASTRIC ULCER, HX OF 10/18/2008   Past Medical History:  Diagnosis Date  . Cancer ([place])    renal ca  . COPD (chronic obstructive pulmonary disease) ([place])   . Drug-seeking behavior   . Pancreatitis   . Pancreatitis   . Seizures ([place])   . Substance abuse ([place])     Family History  Problem Relation Age of Onset  . CAD Mother   . Alcoholism Father   . COPD Father     Past Surgical History:  Procedure Laterality Date  . IR GENERIC HISTORICAL  05/08/2015   IR RADIOLOGIST EVAL & MGMT 05/08/2015 Aletta Edouard, MD [facility] INTERV RAD  .  IR GENERIC HISTORICAL  01/08/2014   IR RADIOLOGIST EVAL & MGMT 01/08/2014 Aletta Edouard, MD [facility] INTERV RAD  . KIDNEY SURGERY     removed cancerous lesions  . PARTIAL GASTRECTOMY     Social History   Occupational History  . Not on file  Tobacco Use  . Smoking status: Current Every Day Smoker    Packs/day: 1.00    Years: 30.00    Pack years: 30.00    Types: Cigarettes  . Smokeless  tobacco: Never Used  . Tobacco comment: smoking up to 1.5 ppd per husband.   Substance and Sexual Activity  . Alcohol use: Yes    Alcohol/week: 0.0 oz  . Drug use: No    Comment: Hx of polysubstance drug abuse  . Sexual activity: Never

## 2017-03-25 NOTE — Progress Notes (Signed)
Office Visit Note   Patient: [name]           Date of Birth: November 08, 1950           MRN: [id number] Visit Date: 03/25/2017              Requested by: Scot Jun, [place], [place] [id number] PCP: Scot Jun, FNP   Assessment & Plan: Visit Diagnoses:  1. Pain of right hip joint   2. Chronic right-sided low back pain with right-sided sciatica     Plan: Call to determine etiology of her present pain.  Right hip fracture appears to have healed.  The intramedullary nail is in excellent position without any complications.  No evidence of any abnormality of her right hip.  Does have some degenerative change in the lumbar spine.  I wonder she does not experience claudication.  I will check an MRI scan of the lumbar spine.  Consider a vascular evaluation.  Follow-Up Instructions: No follow-ups on file.   Orders:  Orders Placed This Encounter  Procedures  . XR FEMUR, MIN 2 VIEWS RIGHT  . XR Lumbar Spine 2-3 Views   No orders of the defined types were placed in this encounter.     Procedures: No notes on file   Clinical Data: No additional findings.   Subjective: Chief Complaint  Patient presents with  . Right Hip - Pain    RIGHT HIP PAIN HAD FX 9 MONTHS AGO, STILL HAVING PAIN RADIATING DOWN FRONT OF THIGH AND AROUND TO THE BACK AND TAILBONE. HAS TROUBLE WALKING.  . New Patient (Initial Visit)    GHT HIP PAIN HAD FX 9 MONTHS AGO, STILL HAVING PAIN RADIATING DOWN FRONT OF THIGH AND AROUND TO THE BACK AND TAILBONE. HAS TROUBLE WALKING.  9 months status post IM nail right hip fracture performed in [facility].  Apparently did well postoperatively only to have acute onset of right hip pain several months has had x-rays through her primary care physician is acute changes.  10 used to experience pain in the area of her right hip although she believes part of the pain is in the area of her thigh buttock and "tailbone. Multiple  medical comorbidities substance abuse, alcoholic pancreatitis and history of smoking.  Has also had left hip fracture with internal fixation. Presently involved in a pain clinic and taking oxycodone.  Continues to smoke but has not had any alcohol in almost a year HPI  Review of Systems   Objective: Vital Signs: BP 111/77 (BP Location: Right Arm, Patient Position: Sitting, Cuff Size: Normal)   Pulse 91   Resp 18   Ht 5\' 4"  (1.626 m)   Wt 72 lb (32.7 kg)   BMI 12.36 kg/m   Physical Exam  Ortho Exam awake alert and oriented x3.  Very thin at 72 pounds.  But appears to be emaciated.  Shaking. some stuttering of her words.  Walks with a limp referable to her right lower extremity.  On sitting had little if any pain with internal/external rotation flexion extension of her right hip.  Thighs are very thin but skin intact.  No swelling or ecchymosis.  Straight leg raise negative.  Multiple areas of tenderness about her right thigh some pain in the area of her right buttock and low back to percussion.  Good capillary refill to toes.  Toes are a little cool  Specialty Comments:  No specialty comments available.  Imaging:  No results found.   PMFS History:  Patient Active Problem List   Diagnosis Date Noted  . Hip fracture, right ([place]) 06/02/2016  . Narcotic addiction ([place]) 02/29/2016  . Abdominal pain, chronic, epigastric- due to chronic pancreatitis 02/29/2016  . COPD (chronic obstructive pulmonary disease) ([place]) 02/28/2016  . Cough 09/03/2015  . Abdominal pain 01/03/2015  . Hyponatremia 01/03/2015  . Hypocalcemia 01/03/2015  . Underweight 01/03/2015  . COPD GOLD III with reversible component  07/17/2014  . DTs (delirium tremens) ([place]) 06/02/2013  . Chronic alcoholic pancreatitis ([place]) 06/02/2013  . Lactic acidosis 06/02/2013  . Recurrent acute pancreatitis 03/28/2013  . Alcohol withdrawal ([place]) 03/28/2013  . Pancreatitis 03/09/2013  . Tobacco abuse 03/09/2013  . Alcohol  withdrawal syndrome with perceptual disturbance ([place]) 03/09/2013  . Acute alcoholic pancreatitis 60/04/5995  . Benzodiazepine withdrawal ([place]) 02/09/2013  . Protein-calorie malnutrition, severe ([place]) 02/09/2013  . Hypokalemia 02/27/2011  . Abdominal pain, acute 02/25/2011  . Nausea and vomiting 02/25/2011  . Thrombocytopenia ([place]) 02/25/2011  . COPD with acute exacerbation ([place]) 02/25/2011  . HTN (hypertension), malignant 02/25/2011  . KIDNEY CANCER 10/18/2008  . Anxiety state 10/18/2008  . GERD 10/18/2008  . CIRRHOSIS 10/18/2008  . PANCREATITIS, CHRONIC- atrophic pancreas 10/18/2008  . DYSPNEA 10/18/2008  . GASTRIC ULCER, HX OF 10/18/2008   Past Medical History:  Diagnosis Date  . Cancer ([place])    renal ca  . COPD (chronic obstructive pulmonary disease) ([place])   . Drug-seeking behavior   . Pancreatitis   . Pancreatitis   . Seizures ([place])   . Substance abuse ([place])     Family History  Problem Relation Age of Onset  . CAD Mother   . Alcoholism Father   . COPD Father     Past Surgical History:  Procedure Laterality Date  . IR GENERIC HISTORICAL  05/08/2015   IR RADIOLOGIST EVAL & MGMT 05/08/2015 Aletta Edouard, MD [facility] INTERV RAD  . IR GENERIC HISTORICAL  01/08/2014   IR RADIOLOGIST EVAL & MGMT 01/08/2014 Aletta Edouard, MD [facility] INTERV RAD  . KIDNEY SURGERY     removed cancerous lesions  . PARTIAL GASTRECTOMY     Social History   Occupational History  . Not on file  Tobacco Use  . Smoking status: Current Every Day Smoker    Packs/day: 1.00    Years: 30.00    Pack years: 30.00    Types: Cigarettes  . Smokeless tobacco: Never Used  . Tobacco comment: smoking up to 1.5 ppd per husband.   Substance and Sexual Activity  . Alcohol use: Not Currently    Alcohol/week: 0.0 oz  . Drug use: No    Comment: Hx of polysubstance drug abuse  . Sexual activity: Never

## 2017-04-03 ENCOUNTER — Other Ambulatory Visit: Payer: Self-pay

## 2017-04-03 ENCOUNTER — Inpatient Hospital Stay (HOSPITAL_COMMUNITY)
Admission: EM | Admit: 2017-04-03 | Discharge: 2017-04-11 | DRG: 917 | Disposition: A | Discharge status: Skilled Nursing Facility | Payer: Medicaid Other | Attending: Internal Medicine | Admitting: Internal Medicine

## 2017-04-03 ENCOUNTER — Encounter (HOSPITAL_COMMUNITY): Payer: Self-pay

## 2017-04-03 DIAGNOSIS — L8990 Pressure ulcer of unspecified site, unspecified stage: Secondary | ICD-10-CM

## 2017-04-03 DIAGNOSIS — F1910 Other psychoactive substance abuse, uncomplicated: Secondary | ICD-10-CM

## 2017-04-03 DIAGNOSIS — F1120 Opioid dependence, uncomplicated: Secondary | ICD-10-CM | POA: Diagnosis present

## 2017-04-03 DIAGNOSIS — R52 Pain, unspecified: Secondary | ICD-10-CM

## 2017-04-03 DIAGNOSIS — T43011A Poisoning by tricyclic antidepressants, accidental (unintentional), initial encounter: Principal | ICD-10-CM | POA: Diagnosis present

## 2017-04-03 DIAGNOSIS — E861 Hypovolemia: Secondary | ICD-10-CM | POA: Diagnosis present

## 2017-04-03 DIAGNOSIS — L89212 Pressure ulcer of right hip, stage 2: Secondary | ICD-10-CM | POA: Diagnosis present

## 2017-04-03 DIAGNOSIS — Z79899 Other long term (current) drug therapy: Secondary | ICD-10-CM

## 2017-04-03 DIAGNOSIS — M79606 Pain in leg, unspecified: Secondary | ICD-10-CM | POA: Diagnosis present

## 2017-04-03 DIAGNOSIS — E860 Dehydration: Secondary | ICD-10-CM | POA: Diagnosis present

## 2017-04-03 DIAGNOSIS — R7989 Other specified abnormal findings of blood chemistry: Secondary | ICD-10-CM | POA: Diagnosis present

## 2017-04-03 DIAGNOSIS — J449 Chronic obstructive pulmonary disease, unspecified: Secondary | ICD-10-CM | POA: Diagnosis present

## 2017-04-03 DIAGNOSIS — Z886 Allergy status to analgesic agent status: Secondary | ICD-10-CM

## 2017-04-03 DIAGNOSIS — E43 Unspecified severe protein-calorie malnutrition: Secondary | ICD-10-CM | POA: Diagnosis present

## 2017-04-03 DIAGNOSIS — K861 Other chronic pancreatitis: Secondary | ICD-10-CM | POA: Diagnosis present

## 2017-04-03 DIAGNOSIS — Z8669 Personal history of other diseases of the nervous system and sense organs: Secondary | ICD-10-CM

## 2017-04-03 DIAGNOSIS — L89319 Pressure ulcer of right buttock, unspecified stage: Secondary | ICD-10-CM | POA: Diagnosis present

## 2017-04-03 DIAGNOSIS — F17210 Nicotine dependence, cigarettes, uncomplicated: Secondary | ICD-10-CM | POA: Diagnosis present

## 2017-04-03 DIAGNOSIS — F13239 Sedative, hypnotic or anxiolytic dependence with withdrawal, unspecified: Secondary | ICD-10-CM | POA: Diagnosis present

## 2017-04-03 DIAGNOSIS — F13939 Sedative, hypnotic or anxiolytic use, unspecified with withdrawal, unspecified: Secondary | ICD-10-CM | POA: Diagnosis present

## 2017-04-03 DIAGNOSIS — Z7951 Long term (current) use of inhaled steroids: Secondary | ICD-10-CM

## 2017-04-03 DIAGNOSIS — Z765 Malingerer [conscious simulation]: Secondary | ICD-10-CM

## 2017-04-03 DIAGNOSIS — F1123 Opioid dependence with withdrawal: Secondary | ICD-10-CM | POA: Diagnosis present

## 2017-04-03 DIAGNOSIS — Z903 Acquired absence of stomach [part of]: Secondary | ICD-10-CM

## 2017-04-03 DIAGNOSIS — N179 Acute kidney failure, unspecified: Secondary | ICD-10-CM | POA: Diagnosis present

## 2017-04-03 DIAGNOSIS — F1320 Sedative, hypnotic or anxiolytic dependence, uncomplicated: Secondary | ICD-10-CM

## 2017-04-03 DIAGNOSIS — R338 Other retention of urine: Secondary | ICD-10-CM

## 2017-04-03 DIAGNOSIS — R339 Retention of urine, unspecified: Secondary | ICD-10-CM | POA: Diagnosis not present

## 2017-04-03 DIAGNOSIS — Z720 Tobacco use: Secondary | ICD-10-CM | POA: Diagnosis present

## 2017-04-03 DIAGNOSIS — R109 Unspecified abdominal pain: Secondary | ICD-10-CM | POA: Diagnosis present

## 2017-04-03 DIAGNOSIS — M25551 Pain in right hip: Secondary | ICD-10-CM | POA: Diagnosis present

## 2017-04-03 DIAGNOSIS — F419 Anxiety disorder, unspecified: Secondary | ICD-10-CM | POA: Diagnosis present

## 2017-04-03 DIAGNOSIS — R64 Cachexia: Secondary | ICD-10-CM | POA: Diagnosis present

## 2017-04-03 DIAGNOSIS — G8929 Other chronic pain: Secondary | ICD-10-CM | POA: Diagnosis present

## 2017-04-03 DIAGNOSIS — Z681 Body mass index (BMI) 19 or less, adult: Secondary | ICD-10-CM

## 2017-04-03 DIAGNOSIS — R112 Nausea with vomiting, unspecified: Secondary | ICD-10-CM | POA: Diagnosis present

## 2017-04-03 DIAGNOSIS — K8681 Exocrine pancreatic insufficiency: Secondary | ICD-10-CM | POA: Diagnosis present

## 2017-04-03 DIAGNOSIS — F411 Generalized anxiety disorder: Secondary | ICD-10-CM | POA: Diagnosis present

## 2017-04-03 DIAGNOSIS — D649 Anemia, unspecified: Secondary | ICD-10-CM | POA: Diagnosis present

## 2017-04-03 DIAGNOSIS — R1013 Epigastric pain: Secondary | ICD-10-CM | POA: Diagnosis present

## 2017-04-03 DIAGNOSIS — Z811 Family history of alcohol abuse and dependence: Secondary | ICD-10-CM

## 2017-04-03 DIAGNOSIS — Z888 Allergy status to other drugs, medicaments and biological substances status: Secondary | ICD-10-CM

## 2017-04-03 DIAGNOSIS — E871 Hypo-osmolality and hyponatremia: Secondary | ICD-10-CM | POA: Diagnosis present

## 2017-04-03 DIAGNOSIS — R636 Underweight: Secondary | ICD-10-CM | POA: Diagnosis present

## 2017-04-03 LAB — COMPREHENSIVE METABOLIC PANEL
ALT: 9 U/L — ABNORMAL LOW (ref 14–54)
AST: 19 U/L (ref 15–41)
Albumin: 3.8 g/dL (ref 3.5–5.0)
Alkaline Phosphatase: 89 U/L (ref 38–126)
Anion gap: 12 (ref 5–15)
BUN: 14 mg/dL (ref 6–20)
CO2: 20 mmol/L — ABNORMAL LOW (ref 22–32)
Calcium: 9.2 mg/dL (ref 8.9–10.3)
Chloride: 96 mmol/L — ABNORMAL LOW (ref 101–111)
Creatinine, Ser: 1.97 mg/dL — ABNORMAL HIGH (ref 0.44–1.00)
GFR calc Af Amer: 30 mL/min — ABNORMAL LOW (ref >60)
GFR calc non Af Amer: 26 mL/min — ABNORMAL LOW (ref >60)
Glucose, Bld: 96 mg/dL (ref 65–99)
Potassium: 3.9 mmol/L (ref 3.5–5.1)
Sodium: 128 mmol/L — ABNORMAL LOW (ref 135–145)
Total Bilirubin: 0.7 mg/dL (ref 0.3–1.2)
Total Protein: 6.7 g/dL (ref 6.5–8.1)

## 2017-04-03 LAB — URINALYSIS, ROUTINE W REFLEX MICROSCOPIC
Bacteria, UA: NONE SEEN
Bilirubin Urine: NEGATIVE
Glucose, UA: NEGATIVE mg/dL
Ketones, ur: NEGATIVE mg/dL
Leukocytes, UA: NEGATIVE
Nitrite: NEGATIVE
Protein, ur: NEGATIVE mg/dL
Specific Gravity, Urine: 1.004 — ABNORMAL LOW (ref 1.005–1.030)
Squamous Epithelial / LPF: NONE SEEN
pH: 7 (ref 5.0–8.0)

## 2017-04-03 LAB — CBC WITH DIFFERENTIAL/PLATELET
Basophils Absolute: 0 10*3/uL (ref 0.0–0.1)
Basophils Relative: 0 %
Eosinophils Absolute: 0.1 10*3/uL (ref 0.0–0.7)
Eosinophils Relative: 1 %
HCT: 33.8 % — ABNORMAL LOW (ref 36.0–46.0)
Hemoglobin: 11.6 g/dL — ABNORMAL LOW (ref 12.0–15.0)
Lymphocytes Relative: 8 %
Lymphs Abs: 1.4 10*3/uL (ref 0.7–4.0)
MCH: 30.7 pg (ref 26.0–34.0)
MCHC: 34.3 g/dL (ref 30.0–36.0)
MCV: 89.4 fL (ref 78.0–100.0)
Monocytes Absolute: 1.3 10*3/uL — ABNORMAL HIGH (ref 0.1–1.0)
Monocytes Relative: 8 %
Neutro Abs: 14 10*3/uL — ABNORMAL HIGH (ref 1.7–7.7)
Neutrophils Relative %: 83 %
Platelets: 214 10*3/uL (ref 150–400)
RBC: 3.78 MIL/uL — ABNORMAL LOW (ref 3.87–5.11)
RDW: 14.5 % (ref 11.5–15.5)
WBC: 16.8 10*3/uL — ABNORMAL HIGH (ref 4.0–10.5)

## 2017-04-03 LAB — RAPID URINE DRUG SCREEN, HOSP PERFORMED
Amphetamines: NOT DETECTED
Barbiturates: NOT DETECTED
Benzodiazepines: POSITIVE — AB
Cocaine: NOT DETECTED
Opiates: POSITIVE — AB
Tetrahydrocannabinol: NOT DETECTED

## 2017-04-03 LAB — ACETAMINOPHEN LEVEL: Acetaminophen (Tylenol), Serum: <10 ug/mL — ABNORMAL LOW (ref 10–30)

## 2017-04-03 LAB — LIPASE, BLOOD: Lipase: 24 U/L (ref 11–51)

## 2017-04-03 LAB — SALICYLATE LEVEL: Salicylate Lvl: <7 mg/dL (ref 2.8–30.0)

## 2017-04-03 LAB — ETHANOL: Alcohol, Ethyl (B): <10 mg/dL (ref <10)

## 2017-04-03 MED ORDER — LORAZEPAM 2 MG/ML IJ SOLN
2.0000 mg | Freq: Once | INTRAMUSCULAR | Status: AC
  Administered 2017-04-03: 2 mg via INTRAVENOUS
  Filled 2017-04-03: qty 1

## 2017-04-03 MED ORDER — LORAZEPAM BOLUS VIA INFUSION: 0.5000 mg | Freq: Three times a day (TID) | INTRAVENOUS | Status: DC | PRN

## 2017-04-03 MED ORDER — ALBUTEROL SULFATE HFA 108 (90 BASE) MCG/ACT IN AERS: 2.0000 | INHALATION_SPRAY | Freq: Four times a day (QID) | RESPIRATORY_TRACT | Status: DC | PRN

## 2017-04-03 MED ORDER — ONDANSETRON 4 MG PO TBDP
4.0000 mg | ORAL_TABLET | Freq: Four times a day (QID) | ORAL | Status: AC | PRN
  Administered 2017-04-03 – 2017-04-07 (×6): 4 mg via ORAL
  Filled 2017-04-03 (×7): qty 1

## 2017-04-03 MED ORDER — CLONIDINE HCL 0.1 MG PO TABS
0.1000 mg | ORAL_TABLET | Freq: Two times a day (BID) | ORAL | Status: AC
  Administered 2017-04-05 – 2017-04-07 (×4): 0.1 mg via ORAL
  Filled 2017-04-03 (×4): qty 1

## 2017-04-03 MED ORDER — METHOCARBAMOL 500 MG PO TABS: 500.0000 mg | ORAL_TABLET | Freq: Three times a day (TID) | ORAL | Status: DC | PRN

## 2017-04-03 MED ORDER — IPRATROPIUM-ALBUTEROL 0.5-2.5 (3) MG/3ML IN SOLN
3.0000 mL | Freq: Four times a day (QID) | RESPIRATORY_TRACT | Status: DC | PRN
  Administered 2017-04-05 – 2017-04-09 (×4): 3 mL via RESPIRATORY_TRACT
  Filled 2017-04-03 (×4): qty 3

## 2017-04-03 MED ORDER — SODIUM CHLORIDE 0.9 % IV SOLN
Freq: Once | INTRAVENOUS | Status: AC
  Administered 2017-04-03: 18:00:00 via INTRAVENOUS

## 2017-04-03 MED ORDER — DICYCLOMINE HCL 20 MG PO TABS
20.0000 mg | ORAL_TABLET | Freq: Four times a day (QID) | ORAL | Status: AC | PRN
  Administered 2017-04-04: 20 mg via ORAL
  Filled 2017-04-03: qty 1

## 2017-04-03 MED ORDER — LORAZEPAM 2 MG/ML IJ SOLN
0.5000 mg | Freq: Three times a day (TID) | INTRAMUSCULAR | Status: DC | PRN
  Administered 2017-04-04: 0.5 mg via INTRAVENOUS
  Filled 2017-04-03: qty 1

## 2017-04-03 MED ORDER — MOMETASONE FURO-FORMOTEROL FUM 200-5 MCG/ACT IN AERO
2.0000 | INHALATION_SPRAY | Freq: Two times a day (BID) | RESPIRATORY_TRACT | Status: DC
  Administered 2017-04-04 – 2017-04-11 (×15): 2 via RESPIRATORY_TRACT
  Filled 2017-04-03: qty 8.8

## 2017-04-03 MED ORDER — OXYCODONE HCL 5 MG PO TABS
5.0000 mg | ORAL_TABLET | Freq: Four times a day (QID) | ORAL | Status: DC | PRN
  Administered 2017-04-03 – 2017-04-08 (×17): 5 mg via ORAL
  Filled 2017-04-03 (×18): qty 1

## 2017-04-03 MED ORDER — TIOTROPIUM BROMIDE MONOHYDRATE 18 MCG IN CAPS
18.0000 ug | ORAL_CAPSULE | Freq: Every day | RESPIRATORY_TRACT | Status: DC
  Administered 2017-04-04 – 2017-04-11 (×8): 18 ug via RESPIRATORY_TRACT
  Filled 2017-04-03 (×2): qty 5

## 2017-04-03 MED ORDER — CLONIDINE HCL 0.1 MG PO TABS
0.1000 mg | ORAL_TABLET | Freq: Every day | ORAL | Status: AC
  Administered 2017-04-08 – 2017-04-09 (×2): 0.1 mg via ORAL
  Filled 2017-04-03 (×2): qty 1

## 2017-04-03 MED ORDER — CLONIDINE HCL 0.1 MG PO TABS
0.1000 mg | ORAL_TABLET | Freq: Four times a day (QID) | ORAL | Status: AC
  Administered 2017-04-03 – 2017-04-05 (×6): 0.1 mg via ORAL
  Filled 2017-04-03 (×8): qty 1

## 2017-04-03 MED ORDER — PANCRELIPASE (LIP-PROT-AMYL) 12000-38000 UNITS PO CPEP
12000.0000 [IU] | ORAL_CAPSULE | Freq: Three times a day (TID) | ORAL | Status: DC
  Administered 2017-04-04 – 2017-04-11 (×14): 12000 [IU] via ORAL
  Filled 2017-04-03 (×18): qty 1

## 2017-04-03 MED ORDER — LOPERAMIDE HCL 2 MG PO CAPS: 2.0000 mg | ORAL_CAPSULE | ORAL | Status: AC | PRN

## 2017-04-03 MED ORDER — PANTOPRAZOLE SODIUM 40 MG PO TBEC
40.0000 mg | DELAYED_RELEASE_TABLET | Freq: Every day | ORAL | Status: DC
  Administered 2017-04-04 – 2017-04-11 (×8): 40 mg via ORAL
  Filled 2017-04-03 (×8): qty 1

## 2017-04-03 MED ORDER — SODIUM CHLORIDE 0.9 % IV BOLUS
500.0000 mL | Freq: Once | INTRAVENOUS | Status: AC
  Administered 2017-04-03: 500 mL via INTRAVENOUS

## 2017-04-03 MED ORDER — HYDROXYZINE HCL 25 MG PO TABS
25.0000 mg | ORAL_TABLET | Freq: Four times a day (QID) | ORAL | Status: AC | PRN
  Administered 2017-04-03 – 2017-04-08 (×13): 25 mg via ORAL
  Filled 2017-04-03 (×14): qty 1

## 2017-04-03 MED ORDER — ENOXAPARIN SODIUM 300 MG/3ML IJ SOLN
20.0000 mg | INTRAMUSCULAR | Status: DC
  Administered 2017-04-04 – 2017-04-09 (×6): 20 mg via SUBCUTANEOUS
  Filled 2017-04-03 (×9): qty 0.2

## 2017-04-03 NOTE — ED Provider Notes (Signed)
[name] Provider Note   CSN: [id number] Arrival date & time: 04/03/17  1104     History   Chief Complaint Chief Complaint  Patient presents with  . Drug Overdose    HPI [name] is a 67 y.o. female with history of COPD, chronic pancreatitis, substance abuse presents today brought in by EMS.  Per EMS report, patient has been on Dilaudid for chronic pain for 25+ years.  The physician prescribing this medication for her is no longer in practice.  She recently established care with a pain management provider who discontinued her Dilaudid and started her on oxycodone 10 mg 4 times daily.  EMS reports that the patient and her husband were involved in an argument as she has been wanting to take more of her pain medication and he has been attempting to keep her compliant.  Apparently the patient took 8-10 tablets of oxycodone at that time.  She denies suicidal ideation but states that she took the medication to get out of pain.  She notes nausea and states "I feel dry ".  She notes pain "all over ".  She states this pain is no worse than her usual chronic pain.  She tells me she has not had her Ativan in 2-3 days.  The history is provided by the patient and the EMS personnel.    Past Medical History:  Diagnosis Date  . Cancer ([place])    renal ca  . COPD (chronic obstructive pulmonary disease) ([place])   . Drug-seeking behavior   . Pancreatitis   . Pancreatitis   . Seizures ([place])   . Substance abuse [facility])     Patient Active Problem List   Diagnosis Date Noted  . AKI (acute kidney injury) ([place]) 04/03/2017  . Hip fracture, right ([place]) 06/02/2016  . Narcotic addiction ([place]) 02/29/2016  . Abdominal pain, chronic, epigastric- due to chronic pancreatitis 02/29/2016  . COPD (chronic obstructive pulmonary disease) ([place]) 02/28/2016  . Cough 09/03/2015  . Abdominal pain 01/03/2015  . Hyponatremia 01/03/2015  . Hypocalcemia 01/03/2015  .  Underweight 01/03/2015  . COPD GOLD III with reversible component  07/17/2014  . DTs (delirium tremens) ([place]) 06/02/2013  . Chronic alcoholic pancreatitis ([place]) 06/02/2013  . Lactic acidosis 06/02/2013  . Recurrent acute pancreatitis 03/28/2013  . Alcohol withdrawal ([place]) 03/28/2013  . Pancreatitis 03/09/2013  . Tobacco abuse 03/09/2013  . Alcohol withdrawal syndrome with perceptual disturbance ([place]) 03/09/2013  . Acute alcoholic pancreatitis 38/25/0539  . Benzodiazepine withdrawal ([place]) 02/09/2013  . Protein-calorie malnutrition, severe ([place]) 02/09/2013  . Hypokalemia 02/27/2011  . Abdominal pain, acute 02/25/2011  . Nausea and vomiting 02/25/2011  . Thrombocytopenia ([place]) 02/25/2011  . COPD with acute exacerbation ([place]) 02/25/2011  . HTN (hypertension), malignant 02/25/2011  . KIDNEY CANCER 10/18/2008  . Anxiety state 10/18/2008  . GERD 10/18/2008  . CIRRHOSIS 10/18/2008  . PANCREATITIS, CHRONIC- atrophic pancreas 10/18/2008  . DYSPNEA 10/18/2008  . GASTRIC ULCER, HX OF 10/18/2008    Past Surgical History:  Procedure Laterality Date  . IR GENERIC HISTORICAL  05/08/2015   IR RADIOLOGIST EVAL & MGMT 05/08/2015 Aletta Edouard, MD [facility] INTERV RAD  . IR GENERIC HISTORICAL  01/08/2014   IR RADIOLOGIST EVAL & MGMT 01/08/2014 Aletta Edouard, MD [facility] INTERV RAD  . KIDNEY SURGERY     removed cancerous lesions  . PARTIAL GASTRECTOMY       OB History   None      Home Medications  Prior to Admission medications   Medication Sig Start Date End Date Taking? Authorizing Provider  albuterol (PROVENTIL HFA;VENTOLIN HFA) 108 (90 Base) MCG/ACT inhaler Inhale 2 puffs into the lungs every 6 (six) hours as needed for wheezing or shortness of breath. 02/27/15  Yes Juanito Doom, MD  amitriptyline (ELAVIL) 25 MG tablet Take 25 mg by mouth at bedtime as needed for anxiety or depression. 03/30/17  Yes [provider]  CREON 12000 units CPEP capsule Take 12,000 Units by mouth 3  (three) times daily with meals. 08/16/16  Yes [provider]  cyproheptadine (PERIACTIN) 4 MG tablet Take 4 mg by mouth 2 (two) times daily. 03/30/17  Yes [provider]  HYDROmorphone (DILAUDID) 4 MG tablet Take 4 mg by mouth every 4 (four) hours as needed for severe pain.    Yes [provider]  LORazepam (ATIVAN) 0.5 MG tablet Take 0.5 mg by mouth 2 (two) times daily. 03/24/17  Yes [provider]  losartan (COZAAR) 50 MG tablet Take 1 tablet (50 mg total) by mouth daily. 03/17/17  Yes Scot Jun, FNP  mometasone-formoterol (DULERA) 200-5 MCG/ACT AERO Inhale 2 puffs into the lungs 2 (two) times daily. 03/15/17  Yes Scot Jun, FNP  NARCAN 4 MG/0.1ML LIQD nasal spray kit Place 1 spray into both nostrils See admin instructions. Once as needed for accidental overdose 03/24/17  Yes [provider]  ondansetron (ZOFRAN) 4 MG tablet Take 4 mg by mouth 3 (three) times daily as needed for nausea/vomiting. 04/02/17  Yes [provider]  Oxycodone HCl 10 MG TABS Take 10 mg by mouth 4 (four) times daily. 03/25/17  Yes [provider]  tiotropium (SPIRIVA HANDIHALER) 18 MCG inhalation capsule INHALE 1 CAPSULE VIA HANDIHALER ONCE DAILY 03/15/17  Yes Scot Jun, FNP  ciprofloxacin (CIPRO) 500 MG tablet Take 1 tablet (500 mg total) by mouth 2 (two) times daily. Patient not taking: Reported on 09/04/5174 16/0/73   Delora Fuel, MD  ipratropium-albuterol (DUONEB) 0.5-2.5 (3) MG/3ML SOLN Take 3 mLs by nebulization every 6 (six) hours as needed (dyspnea not releived by inhaler). 02/29/16   Debbe Odea, MD  LORazepam (ATIVAN) 1 MG tablet Take 1 tablet (1 mg total) by mouth every 6 (six) hours as needed. For anxiety. Patient not taking: Reported on 04/03/2017 04/01/13   Isaac Bliss, Rayford Halsted, MD  metroNIDAZOLE (FLAGYL) 500 MG tablet Take 1 tablet (500 mg total) by mouth 3 (three) times daily. Patient not taking: Reported on 07/14/6267  48/5/46   Delora Fuel, MD  omeprazole (PRILOSEC) 20 MG capsule Take 1 capsule (20 mg total) by mouth daily. Patient not taking: Reported on 04/03/2017 01/15/17   Ezequiel Essex, MD    Family History Family History  Problem Relation Age of Onset  . CAD Mother   . Alcoholism Father   . COPD Father     Social History Social History   Tobacco Use  . Smoking status: Current Every Day Smoker    Packs/day: 1.00    Years: 30.00    Pack years: 30.00    Types: Cigarettes  . Smokeless tobacco: Never Used  . Tobacco comment: smoking up to 1.5 ppd per husband.   Substance Use Topics  . Alcohol use: Not Currently    Alcohol/week: 0.0 oz  . Drug use: No    Comment: Hx of polysubstance drug abuse     Allergies   Aspirin and Chlorpromazine hcl   Review of Systems Review of Systems  Gastrointestinal:  Positive for abdominal pain, nausea and vomiting.  Musculoskeletal: Positive for arthralgias and myalgias.  All other systems reviewed and are negative.    Physical Exam Updated Vital Signs BP (!) 116/92 (BP Location: Left Arm)   Pulse (!) 118   Temp (!) 97.4 F (36.3 C) (Oral)   Resp 20   Ht _0  (1.626 m)   Wt 32.4 kg (71 lb 8 oz)   SpO2 98%   BMI 12.27 kg/m   Physical Exam  Constitutional:  Cachectic in appearance, thrashing around in bed and moaning occasionally in pain.  Appears older than stated age  Cardiovascular:  Tachycardic  Pulmonary/Chest: Effort normal.  Equal rise and fall of chest, scattered rhonchi.  Speaking in full sentences without difficulty.  Abdominal: Soft. Bowel sounds are normal. There is no tenderness.  Well-healed midline surgical incision.  Diffuse tenderness to palpation with no focal tenderness.  Musculoskeletal:  Extremely thin extremities with diffuse muscle wasting  Neurological: She is alert.  Appears somewhat confused but generally answers questions appropriately.  Alert to person and place but not time.  Fluent speech, no facial  droop  Skin: Skin is warm and dry.     ED Treatments / Results  Labs (all labs ordered are listed, but only abnormal results are displayed) Labs Reviewed  RAPID URINE DRUG SCREEN, HOSP PERFORMED - Abnormal; Notable for the following components:      Result Value   Opiates POSITIVE (*)    Benzodiazepines POSITIVE (*)    All other components within normal limits  CBC WITH DIFFERENTIAL/PLATELET - Abnormal; Notable for the following components:   WBC 16.8 (*)    RBC 3.78 (*)    Hemoglobin 11.6 (*)    HCT 33.8 (*)    Neutro Abs 14.0 (*)    Monocytes Absolute 1.3 (*)    All other components within normal limits  ACETAMINOPHEN LEVEL - Abnormal; Notable for the following components:   Acetaminophen (Tylenol), Serum <10 (*)    All other components within normal limits  COMPREHENSIVE METABOLIC PANEL - Abnormal; Notable for the following components:   Sodium 128 (*)    Chloride 96 (*)    CO2 20 (*)    Creatinine, Ser 1.97 (*)    ALT 9 (*)    GFR calc non Af Amer 26 (*)    GFR calc Af Amer 30 (*)    All other components within normal limits  URINALYSIS, ROUTINE W REFLEX MICROSCOPIC - Abnormal; Notable for the following components:   Specific Gravity, Urine 1.004 (*)    Hgb urine dipstick MODERATE (*)    All other components within normal limits  ETHANOL  SALICYLATE LEVEL  LIPASE, BLOOD  HIV ANTIBODY (ROUTINE TESTING)  COMPREHENSIVE METABOLIC PANEL  CBC    EKG None  Radiology No results found.  Procedures Procedures (including critical care time)  Medications Ordered in ED Medications  cloNIDine (CATAPRES) tablet 0.1 mg (0.1 mg Oral Refused 04/03/17 1423)    Followed by  cloNIDine (CATAPRES) tablet 0.1 mg (has no administration in time range)    Followed by  cloNIDine (CATAPRES) tablet 0.1 mg (has no administration in time range)  dicyclomine (BENTYL) tablet 20 mg (has no administration in time range)  hydrOXYzine (ATARAX/VISTARIL) tablet 25 mg (has no administration  in time range)  loperamide (IMODIUM) capsule 2-4 mg (has no administration in time range)  ondansetron (ZOFRAN-ODT) disintegrating tablet 4 mg (4 mg Oral Given 04/03/17 1219)  lipase/protease/amylase (CREON) capsule 12,000 Units (has no  administration in time range)  ipratropium-albuterol (DUONEB) 0.5-2.5 (3) MG/3ML nebulizer solution 3 mL (has no administration in time range)  mometasone-formoterol (DULERA) 200-5 MCG/ACT inhaler 2 puff (has no administration in time range)  pantoprazole (PROTONIX) EC tablet 40 mg (has no administration in time range)  tiotropium (SPIRIVA) inhalation capsule 18 mcg (18 mcg Inhalation Not Given 04/03/17 1642)  oxyCODONE (Oxy IR/ROXICODONE) immediate release tablet 5 mg (has no administration in time range)  enoxaparin (LOVENOX) injection 20 mg (has no administration in time range)  LORazepam (ATIVAN) injection 0.5 mg (has no administration in time range)  sodium chloride 0.9 % bolus 500 mL (0 mLs Intravenous Stopped 04/03/17 1323)  LORazepam (ATIVAN) injection 2 mg (2 mg Intravenous Given 04/03/17 1454)  0.9 %  sodium chloride infusion ( Intravenous New Bag/Given 04/03/17 1730)     Initial Impression / Assessment and Plan / ED Course  I have reviewed the triage vital signs and the nursing notes.  Pertinent labs & imaging results that were available during my care of the patient were reviewed by me and considered in my medical decision making (see chart for details).     Patient with history of chronic pain presents with complaint of intractable nausea and vomiting and possible oxycodone overdose.  She is persistently tachycardic in the ED.  She is alert but appears somewhat agitated and confused, and persistently thrashes around in the bed.  She has a history of taking her chronic pain medicines and appropriately.  She has a new pain management physician who discontinued her 4 mg of Dilaudid and began prescribing oxycodone 10 mg.  Apparently she took several  tablets this morning during a verbal altercation with her husband who was attempting to help her be compliant with her medication.  She also tells me she has not had her Ativan in 2 days, and her vital signs and presentation appears somewhat more consistent with opioid/benzo withdrawal.  Lab work reviewed by me shows leukocytosis of 16.8 and AKI as compared to creatinine in the past.  Suspect this is likely due to dehydration and intractable nausea and vomiting.  Patient remains persistently tachycardic even after receiving 2 mg of Ativan IV.  Spoke with Dr. Zigmund Daniel with [facility] hospitalist service who agrees to assume care of patient and bring her into the hospital for further evaluation and management.  Final Clinical Impressions(s) / ED Diagnoses   Final diagnoses:  AKI (acute kidney injury) [facility])  Polysubstance abuse [facility])    ED Discharge Orders    None       Renita Papa, PA-C 04/03/17 1749    Daleen Bo, MD 04/04/17 [phone]

## 2017-04-03 NOTE — ED Notes (Signed)
[name]( husband) [phone]

## 2017-04-03 NOTE — ED Notes (Signed)
Pt requesting to leave the hospital. PA made aware. PA to go in and speak with pt. Pt states that she wants to go out and smoke. Pt states she wants her Ativan and pain medication. PA informed pt of medical conditions. PA informed pt of risks of leaving AMA. Pt verbalizes understanding of leaving AMA can lead to worsening condition up to death. Pt has decided to stay in the hospital if "we give her the ativan and pain medication she usually takes"

## 2017-04-03 NOTE — H&P (Addendum)
History and Physical    [name] GLO:[id number] DOB: July 17, 1950 DOA: 04/03/2017  PCP: Scot Jun, FNP Patient coming from: Home  Chief Complaint: Pain HPI: [name] is a 67 y.o. female with medical history significant of chronic pancreatitis, chronic abdominal pain on p.o. Dilaudid and Ativan at home came in with complaints of nausea vomiting abdominal pain and not able to keep any food down.  Patient lives at home with her husband.  She is not a good historian.  She told me that as soon as long as she gets her pain medicine and Ativan she will be fine.  She asked me about her pain medication abdomen multiple times while I was in the room with her.  The history is not very clear. I had a long conversation with her husband husband's phone number is [phone].  Patient reported that his wife was on Dilaudid and Ativan for over 20 years.  She got Dilaudid 120 tablets/month and 90 tablets of Ativan.  This was prescribed by Dr. Alyson Ingles.  Now she has new pain management doctor by the name of Jeanella Anton who has prescribed her oxycodone 10 mg.  The husband usually keeps in charge of all his medications or he tries to keep in charge of his other medications but today she got very upset with him and asked him to give her the whole bottle and she took the whole bottle with her.  When the husband came back home she was acting crazy shaking mad on the floor of the kitchen he called EMS and brought her to the hospital.  He thinks that she has taken 10 oxycodone tablets but he is not sure.  According to him she is addicted to these pills for over 20 years.  Even after taking all these pills she is still screams out in the night with pain apparently.  She saw a psychiatrist Dr. Darleene Cleaver who recently prescribed her Elavil 25 mg.  The husband reports that patient cannot tolerate all these medications at the same time.  Patient has had 10 ED visits in the last 6 months.  He said when she runs  out of her medications early she comes to the ER and she gets a dose of Dilaudid and Ativan and discharged back home this has been happening many times in the last 6 months.  ED Course: Patient received Ativan 2 mg.  Her sodium was 128 potassium 3.9 BUN 14 creatinine 1.97.  Her creatinine was has bumped up from 0.94 on March 08, 2017 to 1.97 today.  White count 16.8 hemoglobin 11.6, platelet count 214.  Her Tylenol level was less than 10 salicylate level was less than 7.  Glucose was 96, UA was negative for UTI, urine drug screen was positive for benzodiazepines and opiates. Her vital signs were blood pressure 113 101 temperature 97.2 pulse rate 107 96% on room air. Review of Systems: As per HPI otherwise all other systems reviewed and are negative Ambulatory Status:  Past Medical History:  Diagnosis Date  . Cancer ([place])    renal ca  . COPD (chronic obstructive pulmonary disease) ([place])   . Drug-seeking behavior   . Pancreatitis   . Pancreatitis   . Seizures ([place])   . Substance abuse [facility])     Past Surgical History:  Procedure Laterality Date  . IR GENERIC HISTORICAL  05/08/2015   IR RADIOLOGIST EVAL & MGMT 05/08/2015 Aletta Edouard, MD [facility] INTERV RAD  . IR GENERIC HISTORICAL  01/08/2014   IR RADIOLOGIST EVAL & MGMT 01/08/2014 Aletta Edouard, MD [facility] INTERV RAD  . KIDNEY SURGERY     removed cancerous lesions  . PARTIAL GASTRECTOMY      Social History   Socioeconomic History  . Marital status: Married    Spouse name: Not on file  . Number of children: Not on file  . Years of education: Not on file  . Highest education level: Not on file  Occupational History  . Not on file  Social Needs  . Financial resource strain: Not on file  . Food insecurity:    Worry: Not on file    Inability: Not on file  . Transportation needs:    Medical: Not on file    Non-medical: Not on file  Tobacco Use  . Smoking status: Current Every Day Smoker    Packs/day: 1.00    Years: 30.00    Pack  years: 30.00    Types: Cigarettes  . Smokeless tobacco: Never Used  . Tobacco comment: smoking up to 1.5 ppd per husband.   Substance and Sexual Activity  . Alcohol use: Not Currently    Alcohol/week: 0.0 oz  . Drug use: No    Comment: Hx of polysubstance drug abuse  . Sexual activity: Never  Lifestyle  . Physical activity:    Days per week: Not on file    Minutes per session: Not on file  . Stress: Not on file  Relationships  . Social connections:    Talks on phone: Not on file    Gets together: Not on file    Attends religious service: Not on file    Active member of club or organization: Not on file    Attends meetings of clubs or organizations: Not on file    Relationship status: Not on file  . Intimate partner violence:    Fear of current or ex partner: Not on file    Emotionally abused: Not on file    Physically abused: Not on file    Forced sexual activity: Not on file  Other Topics Concern  . Not on file  Social History Narrative  . Not on file    Allergies  Allergen Reactions  . Aspirin Other (See Comments)    bleeding  . Chlorpromazine Hcl Other (See Comments)    Muscle spasms    Family History  Problem Relation Age of Onset  . CAD Mother   . Alcoholism Father   . COPD Father     Prior to Admission medications   Medication Sig Start Date End Date Taking? Authorizing Provider  HYDROmorphone (DILAUDID) 4 MG tablet Take 4 mg by mouth every 4 (four) hours as needed for severe pain.    Yes [provider]  albuterol (PROVENTIL HFA;VENTOLIN HFA) 108 (90 Base) MCG/ACT inhaler Inhale 2 puffs into the lungs every 6 (six) hours as needed for wheezing or shortness of breath. 02/27/15   Juanito Doom, MD  ciprofloxacin (CIPRO) 500 MG tablet Take 1 tablet (500 mg total) by mouth 2 (two) times daily. Patient not taking: Reported on 06/25/2977 89/2/11   Delora Fuel, MD  CREON 12000 units CPEP capsule Take 12,000 Units by mouth 3 (three) times daily with  meals. 08/16/16   [provider]  ipratropium-albuterol (DUONEB) 0.5-2.5 (3) MG/3ML SOLN Take 3 mLs by nebulization every 6 (six) hours as needed (dyspnea not releived by inhaler). 02/29/16   Debbe Odea, MD  LORazepam (ATIVAN) 1 MG tablet Take 1  tablet (1 mg total) by mouth every 6 (six) hours as needed. For anxiety. Patient taking differently: Take 0.5 mg by mouth every 6 (six) hours as needed for anxiety. For anxiety. 04/01/13   Isaac Bliss, Rayford Halsted, MD  losartan (COZAAR) 50 MG tablet Take 1 tablet (50 mg total) by mouth daily. Patient not taking: Reported on 03/25/2017 03/17/17   Scot Jun, FNP  metroNIDAZOLE (FLAGYL) 500 MG tablet Take 1 tablet (500 mg total) by mouth 3 (three) times daily. Patient not taking: Reported on 3/50/0938 18/2/99   Delora Fuel, MD  mometasone-formoterol [facility]) 200-5 MCG/ACT AERO Inhale 2 puffs into the lungs 2 (two) times daily. 03/15/17   Scot Jun, FNP  omeprazole (PRILOSEC) 20 MG capsule Take 1 capsule (20 mg total) by mouth daily. 01/15/17   Rancour, Annie Main, MD  ondansetron (ZOFRAN ODT) 4 MG disintegrating tablet Take 1 tablet (4 mg total) by mouth every 8 (eight) hours as needed for nausea or vomiting. 08/10/16   Duffy Bruce, MD  tiotropium (SPIRIVA HANDIHALER) 18 MCG inhalation capsule INHALE 1 CAPSULE VIA HANDIHALER ONCE DAILY 03/15/17   Scot Jun, FNP    Physical Exam: Vitals:   04/03/17 1355 04/03/17 1400 04/03/17 1500 04/03/17 1546  BP:  122/77 (!) 140/108 (!) 113/101  Pulse:  (!) 111 (!) 115 (!) 118  Resp: 18 17 18 19   Temp:      TempSrc:      SpO2:  97% 99% 96%  Weight:         General:  Appears calm and comfortable Eyes:  PERRL, EOMI, normal lids, iris ENT:  grossly normal hearing, lips & tongue, mmm Neck:  no LAD, masses or thyromegaly Cardiovascular: RRR, no m/r/g. No LE edema.  Respiratory: CTA bilaterally, no w/r/r. Normal respiratory effort. Abdomen:  soft, ntnd, NABS Skin:  no rash or  induration seen on limited exam Musculoskeletal: grossly normal tone BUE/BLE, good ROM, no bony abnormality Psychiatric:  grossly normal mood and affect, speech fluent and appropriate, AOx3 Neurologic:  CN 2-12 grossly intact, moves all extremities in coordinated fashion, sensation intact  Labs on Admission: I have personally reviewed following labs and imaging studies  CBC: Recent Labs  Lab 04/03/17 1221  WBC 16.8*  NEUTROABS 14.0*  HGB 11.6*  HCT 33.8*  MCV 89.4  PLT 371   Basic Metabolic Panel: Recent Labs  Lab 04/03/17 1221  NA 128*  K 3.9  CL 96*  CO2 20*  GLUCOSE 96  BUN 14  CREATININE 1.97*  CALCIUM 9.2   GFR: Estimated Creatinine Clearance: 14.5 mL/min (A) (by C-G formula based on SCr of 1.97 mg/dL (H)). Liver Function Tests: Recent Labs  Lab 04/03/17 1221  AST 19  ALT 9*  ALKPHOS 89  BILITOT 0.7  PROT 6.7  ALBUMIN 3.8   Recent Labs  Lab 04/03/17 1221  LIPASE 24   No results for input(s): AMMONIA in the last 168 hours. Coagulation Profile: No results for input(s): INR, PROTIME in the last 168 hours. Cardiac Enzymes: No results for input(s): CKTOTAL, CKMB, CKMBINDEX, TROPONINI in the last 168 hours. BNP (last 3 results) No results for input(s): PROBNP in the last 8760 hours. HbA1C: No results for input(s): HGBA1C in the last 72 hours. CBG: No results for input(s): GLUCAP in the last 168 hours. Lipid Profile: No results for input(s): CHOL, HDL, LDLCALC, TRIG, CHOLHDL, LDLDIRECT in the last 72 hours. Thyroid Function Tests: No results for input(s): TSH, T4TOTAL, FREET4, T3FREE, THYROIDAB in the last 72  hours. Anemia Panel: No results for input(s): VITAMINB12, FOLATE, FERRITIN, TIBC, IRON, RETICCTPCT in the last 72 hours. Urine analysis:    Component Value Date/Time   COLORURINE YELLOW 04/03/2017 1205   APPEARANCEUR CLEAR 04/03/2017 1205   LABSPEC 1.004 (L) 04/03/2017 1205   PHURINE 7.0 04/03/2017 1205   GLUCOSEU NEGATIVE 04/03/2017 1205    HGBUR MODERATE (A) 04/03/2017 1205   BILIRUBINUR NEGATIVE 04/03/2017 1205   KETONESUR NEGATIVE 04/03/2017 1205   PROTEINUR NEGATIVE 04/03/2017 1205   UROBILINOGEN 0.2 03/15/2017 1004   NITRITE NEGATIVE 04/03/2017 1205   LEUKOCYTESUR NEGATIVE 04/03/2017 1205    Creatinine Clearance: Estimated Creatinine Clearance: 14.5 mL/min (A) (by C-G formula based on SCr of 1.97 mg/dL (H)).  Sepsis Labs: @LABRCNTIP (procalcitonin:4,lacticidven:4) )No results found for this or any previous visit (from the past 240 hour(s)).   Radiological Exams on Admission: No results found.  EKG: Independently reviewed.   Assessment/Plan Active Problems:   * No active hospital problems. *   1] AK I secondary to decreased p.o. intake nausea vomiting and dehydration.  Patient presented with intractable nausea vomiting and abdominal pain and chronic body pain.  Will give her IV hydration and recheck labs tomorrow.  2] polysubstance abuse/polypharmacy-patient has been E used to taking Dilaudid and Ativan for over 20 years now on oxycodone, Ativan, amitriptyline 25 mg.  She clearly has a drug-seeking behavior.  Please call her pain management Dr. Jeanella Anton tomorrow.  Her psychiatrist is Dr. Darleene Cleaver.  She runs out of her pain medications way before she is supposed to.  If she gets a 1 month supply she finishes off in 2 weeks.  I have placed her on a months minimal dose of Ativan 0.5 mg every 8 as needed and oxycodone 5 mg every 6 as needed.  PT OT evaluation.  3] chronic pancreatitis with normal lipase at this time.  IV fluids continue Creon.  4] history of COPD and tobacco abuse continue home inhalers.  Currently she does not have any wheezing or or cough.  5] hyponatremia probably secondary to decreased p.o. intake.  Continue normal saline.  Follow-up labs tomorrow.   DVT prophylaxis: Lovenox Code Status: Full code Family Communication: Discussed with husband on the phone in detail.  Husband's name is  Ronalee Belts and his phone number is [phone]. Disposition Plan: TBD Consults called: None Admission status: Observation.   Georgette Shell MD [facility]  If 7PM-7AM, please contact night-coverage [email or web address] Password [facility]  04/03/2017, 3:51 PM

## 2017-04-03 NOTE — ED Notes (Signed)
ED TO INPATIENT HANDOFF REPORT  Name/Age/Gender [name] 67 y.o. female  Code Status    Code Status Orders  (From admission, onward)        Start     Ordered   04/03/17 1616  Full code  Continuous     04/03/17 1617    Code Status History    Date Active Date Inactive Code Status Order ID Comments User Context   04/03/2017 1207 04/03/2017 1617 Full Code [id number]  Renita Papa, PA-C ED   02/28/2016 1050 02/29/2016 1439 Full Code [id number]  Annita Brod, MD Inpatient   01/03/2015 1440 01/05/2015 1428 Full Code [id number]  Theodis Blaze, MD Inpatient   07/03/2014 1716 07/05/2014 1540 Full Code [id number]  Cristal Ford, DO Inpatient   06/02/2013 0029 06/06/2013 1456 Full Code [id number]  Etta Quill, DO ED   03/28/2013 1941 04/01/2013 1925 Full Code [id number]  Marybelle Killings, MD Inpatient   03/28/2013 0550 03/28/2013 1941 Full Code [id number]  Berle Mull, MD ED   03/09/2013 2235 03/11/2013 2210 Full Code [id number]  Toy Baker, MD ED   02/09/2013 0247 02/11/2013 1915 Full Code [id number]  Etta Quill, DO ED      Home/SNF/Other Home  Chief Complaint OD  Level of Care/Admitting Diagnosis ED Disposition    ED Disposition Condition [place]: [facility] [facility] [[id number]]  Level of Care: Med-Surg [16]  Diagnosis: AKI (acute kidney injury) [facility]) [[id number]]  Admitting Physician: Georgette Shell [[id number]]  Attending Physician: Georgette Shell [phone]  PT Class (Do Not Modify): Observation [104]  PT Acc Code (Do Not Modify): Observation [10022]       Medical History Past Medical History:  Diagnosis Date  . Cancer ([place])    renal ca  . COPD (chronic obstructive pulmonary disease) ([place])   . Drug-seeking behavior   . Pancreatitis   . Pancreatitis   . Seizures ([place])   . Substance abuse ([facility])     Allergies Allergies  Allergen Reactions  . Aspirin Other (See Comments)    bleeding  . Chlorpromazine Hcl Other (See  Comments)    Muscle spasms    IV Location/Drains/Wounds Patient Lines/Drains/Airways Status   Active Line/Drains/Airways    Name:   Placement date:   Placement time:   Site:   Days:   Peripheral IV 04/03/17 Left Forearm   04/03/17    1201    Forearm   less than 1   Peripheral IV 04/03/17 Right;Upper Arm   04/03/17    1216    Arm   less than 1          Labs/Imaging Results for orders placed or performed during the hospital encounter of 04/03/17 (from the past 48 hour(s))  Rapid urine drug screen (hospital performed)     Status: Abnormal   Collection Time: 04/03/17 12:05 PM  Result Value Ref Range   Opiates POSITIVE (A) NONE DETECTED   Cocaine NONE DETECTED NONE DETECTED   Benzodiazepines POSITIVE (A) NONE DETECTED   Amphetamines NONE DETECTED NONE DETECTED   Tetrahydrocannabinol NONE DETECTED NONE DETECTED   Barbiturates NONE DETECTED NONE DETECTED    Comment: (NOTE) DRUG SCREEN FOR MEDICAL PURPOSES ONLY.  IF CONFIRMATION IS NEEDED FOR ANY PURPOSE, NOTIFY LAB WITHIN 5 DAYS. LOWEST DETECTABLE LIMITS FOR URINE DRUG SCREEN Drug Class                     Cutoff (ng/mL) Amphetamine and  metabolites    1000 Barbiturate and metabolites    200 Benzodiazepine                 076 Tricyclics and metabolites     300 Opiates and metabolites        300 Cocaine and metabolites        300 THC                            50 Performed at [facility], [place] [address], [place], [place] [address]   Urinalysis, Routine w reflex microscopic     Status: Abnormal   Collection Time: 04/03/17 12:05 PM  Result Value Ref Range   Color, Urine YELLOW YELLOW   APPearance CLEAR CLEAR   Specific Gravity, Urine 1.004 (L) 1.005 - 1.030   pH 7.0 5.0 - 8.0   Glucose, UA NEGATIVE NEGATIVE mg/dL   Hgb urine dipstick MODERATE (A) NEGATIVE   Bilirubin Urine NEGATIVE NEGATIVE   Ketones, ur NEGATIVE NEGATIVE mg/dL   Protein, ur NEGATIVE NEGATIVE mg/dL   Nitrite NEGATIVE NEGATIVE    Leukocytes, UA NEGATIVE NEGATIVE   RBC / HPF 6-30 0 - 5 RBC/hpf   WBC, UA 0-5 0 - 5 WBC/hpf   Bacteria, UA NONE SEEN NONE SEEN   Squamous Epithelial / LPF NONE SEEN NONE SEEN    Comment: Performed at [facility], [place] [address] [place], [place] [address]  CBC with Differential     Status: Abnormal   Collection Time: 04/03/17 12:21 PM  Result Value Ref Range   WBC 16.8 (H) 4.0 - 10.5 K/uL   RBC 3.78 (L) 3.87 - 5.11 MIL/uL   Hemoglobin 11.6 (L) 12.0 - 15.0 g/dL   HCT 33.8 (L) 36.0 - 46.0 %   MCV 89.4 78.0 - 100.0 fL   MCH 30.7 26.0 - 34.0 pg   MCHC 34.3 30.0 - 36.0 g/dL   RDW 14.5 11.5 - 15.5 %   Platelets 214 150 - 400 K/uL   Neutrophils Relative % 83 %   Neutro Abs 14.0 (H) 1.7 - 7.7 K/uL   Lymphocytes Relative 8 %   Lymphs Abs 1.4 0.7 - 4.0 K/uL   Monocytes Relative 8 %   Monocytes Absolute 1.3 (H) 0.1 - 1.0 K/uL   Eosinophils Relative 1 %   Eosinophils Absolute 0.1 0.0 - 0.7 K/uL   Basophils Relative 0 %   Basophils Absolute 0.0 0.0 - 0.1 K/uL    Comment: Performed at [facility], [place] [address] [place], [place] [address]  Ethanol     Status: None   Collection Time: 04/03/17 12:21 PM  Result Value Ref Range   Alcohol, Ethyl (B) <10 <10 mg/dL    Comment: Performed at [facility], [place] [address] [place], [place] [address]  Salicylate level     Status: None   Collection Time: 04/03/17 12:21 PM  Result Value Ref Range   Salicylate Lvl <2.8 2.8 - 30.0 mg/dL    Comment: Performed at [facility], [place] [address] [place], [place] [address]  Acetaminophen level     Status: Abnormal   Collection Time: 04/03/17 12:21 PM  Result Value Ref Range   Acetaminophen (Tylenol), Serum <10 (L) 10 - 30 ug/mL    Comment:        THERAPEUTIC CONCENTRATIONS VARY SIGNIFICANTLY. A RANGE OF 10-30 ug/mL MAY BE AN EFFECTIVE CONCENTRATION FOR MANY  PATIENTS. HOWEVER, SOME ARE BEST TREATED AT CONCENTRATIONS OUTSIDE  THIS RANGE. ACETAMINOPHEN CONCENTRATIONS >150 ug/mL AT 4 HOURS AFTER INGESTION AND >50 ug/mL AT 12 HOURS AFTER INGESTION ARE OFTEN ASSOCIATED WITH TOXIC REACTIONS. Performed at [facility], [place] [address], [place], [place] [address]   Comprehensive metabolic panel     Status: Abnormal   Collection Time: 04/03/17 12:21 PM  Result Value Ref Range   Sodium 128 (L) 135 - 145 mmol/L   Potassium 3.9 3.5 - 5.1 mmol/L   Chloride 96 (L) 101 - 111 mmol/L   CO2 20 (L) 22 - 32 mmol/L   Glucose, Bld 96 65 - 99 mg/dL   BUN 14 6 - 20 mg/dL   Creatinine, Ser 1.97 (H) 0.44 - 1.00 mg/dL   Calcium 9.2 8.9 - 10.3 mg/dL   Total Protein 6.7 6.5 - 8.1 g/dL   Albumin 3.8 3.5 - 5.0 g/dL   AST 19 15 - 41 U/L   ALT 9 (L) 14 - 54 U/L   Alkaline Phosphatase 89 38 - 126 U/L   Total Bilirubin 0.7 0.3 - 1.2 mg/dL   GFR calc non Af Amer 26 (L) >60 mL/min   GFR calc Af Amer 30 (L) >60 mL/min    Comment: (NOTE) The eGFR has been calculated using the CKD EPI equation. This calculation has not been validated in all clinical situations. eGFR's persistently <60 mL/min signify possible Chronic Kidney Disease.    Anion gap 12 5 - 15    Comment: Performed at [facility], [place] [address], [place], [place] [address]  Lipase, blood     Status: None   Collection Time: 04/03/17 12:21 PM  Result Value Ref Range   Lipase 24 11 - 51 U/L    Comment: Performed at [facility]: [place], [place] [address], [place], [place] [address]   No results found.  Pending Labs Unresulted Labs (From admission, onward)   Start     Ordered   04/04/17 0500  Comprehensive metabolic panel  Tomorrow morning,   R     04/03/17 1617   04/04/17 0500  CBC  Tomorrow morning,   R     04/03/17 1617   04/03/17 1611  HIV antibody (Routine Testing)  Once,   R     04/03/17 1617      Vitals/Pain Today's Vitals   04/03/17 1355 04/03/17 1400 04/03/17 1500 04/03/17 1546  BP:  122/77 (!) 140/108 (!)  113/101  Pulse:  (!) 111 (!) 115 (!) 118  Resp: _0 Temp:      TempSrc:      SpO2:  97% 99% 96%  Weight:      PainSc:        Isolation Precautions No active isolations  Medications Medications  cloNIDine (CATAPRES) tablet 0.1 mg (0.1 mg Oral Refused 04/03/17 1423)    Followed by  cloNIDine (CATAPRES) tablet 0.1 mg (has no administration in time range)    Followed by  cloNIDine (CATAPRES) tablet 0.1 mg (has no administration in time range)  dicyclomine (BENTYL) tablet 20 mg (has no administration in time range)  hydrOXYzine (ATARAX/VISTARIL) tablet 25 mg (has no administration in time range)  loperamide (IMODIUM) capsule 2-4 mg (has no administration in time range)  ondansetron (ZOFRAN-ODT) disintegrating tablet 4 mg (4 mg Oral Given 04/03/17 1219)  albuterol (PROVENTIL HFA;VENTOLIN HFA) 108 (90 Base) MCG/ACT inhaler 2 puff (has no administration in time range)  lipase/protease/amylase (CREON) capsule 12,000  Units (has no administration in time range)  ipratropium-albuterol (DUONEB) 0.5-2.5 (3) MG/3ML nebulizer solution 3 mL (has no administration in time range)  mometasone-formoterol (DULERA) 200-5 MCG/ACT inhaler 2 puff (has no administration in time range)  pantoprazole (PROTONIX) EC tablet 40 mg (has no administration in time range)  tiotropium (SPIRIVA) inhalation capsule 18 mcg (18 mcg Inhalation Not Given 04/03/17 1642)  LORazepam (ATIVAN) bolus via infusion 0.5 mg (has no administration in time range)  oxyCODONE (Oxy IR/ROXICODONE) immediate release tablet 5 mg (has no administration in time range)  0.9 %  sodium chloride infusion (has no administration in time range)  enoxaparin (LOVENOX) injection 30 mg (has no administration in time range)  sodium chloride 0.9 % bolus 500 mL (0 mLs Intravenous Stopped 04/03/17 1323)  LORazepam (ATIVAN) injection 2 mg (2 mg Intravenous Given 04/03/17 1454)    Mobility walks

## 2017-04-03 NOTE — ED Triage Notes (Signed)
Pt arrives via EMS from Home. Per EMS: Pt has been on Dilaudid for chronic pain for 25+ years. PCP recently took pt off Dilaudid and prescribed Oxycodone (unknown MG). Husband reported to EMS that the pt has been wanting to take more than prescribed and he wouldn't allow her. Today there was an argument about the medication and husband gave her the bottle. Pt took 8-10 Oxycodones (unknown mg) Pt denies SI. Pt took the meds to "get out of pain" Pt is thrashing in bed, moaning. Pt is alert.

## 2017-04-03 NOTE — ED Notes (Signed)
Pt provided with lunch tray and coke. Sitter at bedside. Pt has asked for pain medication multiple times. PA is aware. No new orders for pain meds at this time due to uncertainty of amount of pain medication ingested at home.

## 2017-04-04 DIAGNOSIS — R450 Nervousness: Secondary | ICD-10-CM | POA: Diagnosis not present

## 2017-04-04 DIAGNOSIS — T43014A Poisoning by tricyclic antidepressants, undetermined, initial encounter: Secondary | ICD-10-CM | POA: Diagnosis not present

## 2017-04-04 DIAGNOSIS — Z811 Family history of alcohol abuse and dependence: Secondary | ICD-10-CM | POA: Diagnosis not present

## 2017-04-04 DIAGNOSIS — M79606 Pain in leg, unspecified: Secondary | ICD-10-CM | POA: Diagnosis present

## 2017-04-04 DIAGNOSIS — R338 Other retention of urine: Secondary | ICD-10-CM | POA: Diagnosis not present

## 2017-04-04 DIAGNOSIS — F1123 Opioid dependence with withdrawal: Secondary | ICD-10-CM | POA: Diagnosis present

## 2017-04-04 DIAGNOSIS — F17210 Nicotine dependence, cigarettes, uncomplicated: Secondary | ICD-10-CM | POA: Diagnosis not present

## 2017-04-04 DIAGNOSIS — T402X4A Poisoning by other opioids, undetermined, initial encounter: Secondary | ICD-10-CM | POA: Diagnosis not present

## 2017-04-04 DIAGNOSIS — M25551 Pain in right hip: Secondary | ICD-10-CM | POA: Diagnosis present

## 2017-04-04 DIAGNOSIS — Z681 Body mass index (BMI) 19 or less, adult: Secondary | ICD-10-CM | POA: Diagnosis not present

## 2017-04-04 DIAGNOSIS — K861 Other chronic pancreatitis: Secondary | ICD-10-CM | POA: Diagnosis present

## 2017-04-04 DIAGNOSIS — F13239 Sedative, hypnotic or anxiolytic dependence with withdrawal, unspecified: Secondary | ICD-10-CM | POA: Diagnosis present

## 2017-04-04 DIAGNOSIS — R7989 Other specified abnormal findings of blood chemistry: Secondary | ICD-10-CM | POA: Diagnosis present

## 2017-04-04 DIAGNOSIS — T43011A Poisoning by tricyclic antidepressants, accidental (unintentional), initial encounter: Secondary | ICD-10-CM | POA: Diagnosis not present

## 2017-04-04 DIAGNOSIS — N179 Acute kidney failure, unspecified: Secondary | ICD-10-CM

## 2017-04-04 DIAGNOSIS — F1320 Sedative, hypnotic or anxiolytic dependence, uncomplicated: Secondary | ICD-10-CM | POA: Diagnosis not present

## 2017-04-04 DIAGNOSIS — F411 Generalized anxiety disorder: Secondary | ICD-10-CM | POA: Diagnosis not present

## 2017-04-04 DIAGNOSIS — R64 Cachexia: Secondary | ICD-10-CM | POA: Diagnosis present

## 2017-04-04 DIAGNOSIS — D649 Anemia, unspecified: Secondary | ICD-10-CM | POA: Diagnosis present

## 2017-04-04 DIAGNOSIS — R1084 Generalized abdominal pain: Secondary | ICD-10-CM | POA: Diagnosis not present

## 2017-04-04 DIAGNOSIS — E871 Hypo-osmolality and hyponatremia: Secondary | ICD-10-CM | POA: Diagnosis present

## 2017-04-04 DIAGNOSIS — E860 Dehydration: Secondary | ICD-10-CM | POA: Diagnosis present

## 2017-04-04 DIAGNOSIS — Z720 Tobacco use: Secondary | ICD-10-CM | POA: Diagnosis not present

## 2017-04-04 DIAGNOSIS — E43 Unspecified severe protein-calorie malnutrition: Secondary | ICD-10-CM | POA: Diagnosis present

## 2017-04-04 DIAGNOSIS — R339 Retention of urine, unspecified: Secondary | ICD-10-CM | POA: Diagnosis not present

## 2017-04-04 DIAGNOSIS — J449 Chronic obstructive pulmonary disease, unspecified: Secondary | ICD-10-CM | POA: Diagnosis present

## 2017-04-04 DIAGNOSIS — F419 Anxiety disorder, unspecified: Secondary | ICD-10-CM

## 2017-04-04 DIAGNOSIS — G8929 Other chronic pain: Secondary | ICD-10-CM | POA: Diagnosis present

## 2017-04-04 DIAGNOSIS — R112 Nausea with vomiting, unspecified: Secondary | ICD-10-CM | POA: Diagnosis not present

## 2017-04-04 DIAGNOSIS — L89212 Pressure ulcer of right hip, stage 2: Secondary | ICD-10-CM | POA: Diagnosis present

## 2017-04-04 DIAGNOSIS — F1910 Other psychoactive substance abuse, uncomplicated: Secondary | ICD-10-CM | POA: Diagnosis present

## 2017-04-04 DIAGNOSIS — F1120 Opioid dependence, uncomplicated: Secondary | ICD-10-CM | POA: Diagnosis not present

## 2017-04-04 DIAGNOSIS — K8681 Exocrine pancreatic insufficiency: Secondary | ICD-10-CM | POA: Diagnosis present

## 2017-04-04 DIAGNOSIS — R1013 Epigastric pain: Secondary | ICD-10-CM | POA: Diagnosis present

## 2017-04-04 DIAGNOSIS — L89319 Pressure ulcer of right buttock, unspecified stage: Secondary | ICD-10-CM | POA: Diagnosis not present

## 2017-04-04 DIAGNOSIS — E861 Hypovolemia: Secondary | ICD-10-CM | POA: Diagnosis present

## 2017-04-04 LAB — HIV ANTIBODY (ROUTINE TESTING W REFLEX): HIV Screen 4th Generation wRfx: NONREACTIVE

## 2017-04-04 LAB — COMPREHENSIVE METABOLIC PANEL
ALBUMIN: 2.8 g/dL — AB (ref 3.5–5.0)
ALK PHOS: 68 U/L (ref 38–126)
ALT: 9 U/L — ABNORMAL LOW (ref 14–54)
ANION GAP: 5 (ref 5–15)
AST: 19 U/L (ref 15–41)
BUN: 10 mg/dL (ref 6–20)
CALCIUM: 8.2 mg/dL — AB (ref 8.9–10.3)
CHLORIDE: 108 mmol/L (ref 101–111)
CO2: 23 mmol/L (ref 22–32)
Creatinine, Ser: 1.27 mg/dL — ABNORMAL HIGH (ref 0.44–1.00)
GFR calc Af Amer: 51 mL/min — ABNORMAL LOW (ref >60)
GFR calc non Af Amer: 44 mL/min — ABNORMAL LOW (ref >60)
GLUCOSE: 83 mg/dL (ref 65–99)
Potassium: 3.7 mmol/L (ref 3.5–5.1)
SODIUM: 136 mmol/L (ref 135–145)
Total Bilirubin: 0.4 mg/dL (ref 0.3–1.2)
Total Protein: 5.2 g/dL — ABNORMAL LOW (ref 6.5–8.1)

## 2017-04-04 LAB — CBC
HCT: 27.1 % — ABNORMAL LOW (ref 36.0–46.0)
HEMOGLOBIN: 9.1 g/dL — AB (ref 12.0–15.0)
MCH: 30.4 pg (ref 26.0–34.0)
MCHC: 33.6 g/dL (ref 30.0–36.0)
MCV: 90.6 fL (ref 78.0–100.0)
Platelets: 161 10*3/uL (ref 150–400)
RBC: 2.99 MIL/uL — AB (ref 3.87–5.11)
RDW: 14.7 % (ref 11.5–15.5)
WBC: 5.6 10*3/uL (ref 4.0–10.5)

## 2017-04-04 MED ORDER — LORAZEPAM 2 MG/ML IJ SOLN
0.5000 mg | Freq: Four times a day (QID) | INTRAMUSCULAR | Status: DC | PRN
  Administered 2017-04-04 – 2017-04-05 (×2): 0.5 mg via INTRAVENOUS
  Filled 2017-04-04 (×2): qty 1

## 2017-04-04 MED ORDER — NICOTINE 21 MG/24HR TD PT24
21.0000 mg | MEDICATED_PATCH | Freq: Every day | TRANSDERMAL | Status: DC
  Administered 2017-04-04 – 2017-04-11 (×8): 21 mg via TRANSDERMAL
  Filled 2017-04-04 (×8): qty 1

## 2017-04-04 MED ORDER — ONDANSETRON HCL 4 MG/2ML IJ SOLN
4.0000 mg | Freq: Three times a day (TID) | INTRAMUSCULAR | Status: DC | PRN
Start: 2017-04-04 — End: 2017-04-11
  Administered 2017-04-04 – 2017-04-11 (×13): 4 mg via INTRAVENOUS
  Filled 2017-04-04 (×15): qty 2

## 2017-04-04 MED ORDER — SODIUM CHLORIDE 0.9 % IV SOLN
INTRAVENOUS | Status: AC
  Administered 2017-04-04 (×2): via INTRAVENOUS

## 2017-04-04 MED ORDER — BOOST / RESOURCE BREEZE PO LIQD CUSTOM
1.0000 | Freq: Three times a day (TID) | ORAL | Status: DC
  Administered 2017-04-04 – 2017-04-10 (×11): 1 via ORAL

## 2017-04-04 MED ORDER — GABAPENTIN 300 MG PO CAPS
300.0000 mg | ORAL_CAPSULE | Freq: Three times a day (TID) | ORAL | Status: DC
  Administered 2017-04-04 – 2017-04-11 (×19): 300 mg via ORAL
  Filled 2017-04-04 (×20): qty 1

## 2017-04-04 MED ORDER — HYDROMORPHONE HCL 1 MG/ML IJ SOLN
0.5000 mg | Freq: Four times a day (QID) | INTRAMUSCULAR | Status: DC | PRN
  Administered 2017-04-04 – 2017-04-05 (×3): 0.5 mg via INTRAVENOUS
  Filled 2017-04-04 (×3): qty 0.5

## 2017-04-04 NOTE — Progress Notes (Signed)
PROGRESS NOTE    [name]  OZD:[id number] DOB: 07-Apr-1950 DOA: 04/03/2017 PCP: Scot Jun, FNP   Brief Narrative:  HPI per Dr. Landis Gandy: [name] is a 67 y.o. female with medical history significant of chronic pancreatitis, chronic abdominal pain on p.o. Dilaudid and Ativan at home came in with complaints of nausea vomiting abdominal pain and not able to keep any food down.  Patient lives at home with her husband.  She is not a good historian.  She told me that as soon as long as she gets her pain medicine and Ativan she will be fine.  She asked me about her pain medication abdomen multiple times while I was in the room with her.  The history is not very clear. I had a long conversation with her husband husband's phone number is [phone].  Patient reported that his wife was on Dilaudid and Ativan for over 20 years.  She got Dilaudid 120 tablets/month and 90 tablets of Ativan.  This was prescribed by Dr. Alyson Ingles.  Now she has new pain management doctor by the name of Jeanella Anton who has prescribed her oxycodone 10 mg.  The husband usually keeps in charge of all his medications or he tries to keep in charge of his other medications but today she got very upset with him and asked him to give her the whole bottle and she took the whole bottle with her.  When the husband came back home she was acting crazy shaking mad on the floor of the kitchen he called EMS and brought her to the hospital.  He thinks that she has taken 10 oxycodone tablets but he is not sure.  According to him she is addicted to these pills for over 20 years.  Even after taking all these pills she is still screams out in the night with pain apparently.  She saw a psychiatrist Dr. Darleene Cleaver who recently prescribed her Elavil 25 mg.  The husband reports that patient cannot tolerate all these medications at the same time.  Patient has had 10 ED visits in the last 6 months.  He said when she runs out of her  medications early she comes to the ER and she gets a dose of Dilaudid and Ativan and discharged back home this has been happening many times in the last 6 months.  ED Course: Patient received Ativan 2 mg.  Her sodium was 128 potassium 3.9 BUN 14 creatinine 1.97.  Her creatinine was has bumped up from 0.94 on March 08, 2017 to 1.97 today.  White count 16.8 hemoglobin 11.6, platelet count 214.  Her Tylenol level was less than 10 salicylate level was less than 7.  Glucose was 96, UA was negative for UTI, urine drug screen was positive for benzodiazepines and opiates.  **Seen and was having tremors and complaining of Abdominal Pain. It appears she is withdrawing so will restart IV dilaudid. Psych consulted for further evaluation and recommendations given her Overdose of Oxycodone and Elavil.   Assessment & Plan:   Active Problems:   Anxiety state   Nausea and vomiting   Benzodiazepine withdrawal (HCC)   Tobacco abuse   Abdominal pain   Hyponatremia   Underweight   COPD (chronic obstructive pulmonary disease) (HCC)   Narcotic addiction (HCC)   Abdominal pain, chronic, epigastric- due to chronic pancreatitis   AKI (acute kidney injury) ([place])  Polysubstance Abuse/Polypharmacy/Overdose concern for Opiate and Benzo Withdrawal  -Patient has been used to taking  Dilaudid and Ativan for over 20 years; -Now on oxycodone, Ativan, amitriptyline 25 mg.   -She clearly has a drug-seeking behavior.   -Called her pain management Provider Jeanella Anton who states that because the patient overdosed and who didn't take her Oxycodone like she should would not accept her back as a patient and will refer her to Inpatient Treatment facility once she is discharged from the Hospital.   -Her psychiatrist is Dr. Darleene Cleaver and per husband she has been taking her Amitriptyline 25 mg 3-4x a day -She runs out of her pain medications way before she is supposed to and if she gets a 1 month supply she finishes off in 2 weeks.    -She was was started on IV Ativan 0.5 mg q8hprn and increased to IV 0.5 mg q6hprn Anxiety and Agitation -C/w Hydroxyzine 25 mg po q6hprn Anxiety  -Was also started on Oxycodone 5 mg q6hprn for Severe Pain -Concern for Benzo and Opiate withdrawal so Added IV 0.5 mg Hydromorphone q6hprn -Started on Clonidine Taper 0.1 mg as outlined -Consulted Psychiatry for Further Evaluation and recommendations given Overdose (Likely unintentional)  -No SI/HI currently  -EtOH Level was <44 and Salicylate Level was <9.6 -UDS Positive for Benzodizepines and Opiates  -Has a Sitter 1:1 -Discussed Case with Dr. Evette Doffing of [facility] and he states make referral as outpatient to [phone] when patient is discharged  -Continue to Monitor Withdrawal Symptoms carefully and c/w Loperamide 2-4 mg po prn Diarrhea or Loose stools  -Anti Emetics with po Zofran q6hprn and IV Zofran 4 mg q8hprn   AKI secondary to Decreased p.o. Intake, Nausea Vomiting and Dehydration.   -Patient presented with intractable nausea, vomiting, and abdominal pain and chronic body pain.  -Baseline Cr normal and was Normal on 3/15 -BUN/Cr went from 14/1.97 -> 10/1.27 -C/w IVF Rehydration with NS at 100 mL/hr -Avoid Nephrotoxins if Possible -Repeat CMP in AM  Acute Urinary Retention -Likely 2/2 to Anticholinergic Effects from TCA effects -Hold Amitriptyline -Foley Cathter Inserted -C/w IVF Rehydration as above  Chronic Pancreatitis with Abdominal Pain -Normal lipase at this time. -C/w IV fluids at 100 mL/hr -C/w Dicyclomine 20 mg po q6hprn Spasms and Abdominal Cramping x 5 days  -C/w Lipase/Protease/Amylase 12,000 units TIDwm  COPD -Currently not in Exacerbation as she does not have any wheezing or or cough -Added DuoNeb 3 mL q6hprn Wheezing and SOB -Continue home inhalers with Dulera  -C/w Tiotropium Inhalation capsule 18 mcg Daily  -Repeat CXR in AM   Tobacco Abuse  -Added Nicotine Patch 21 mg TD q24h -Smoking  Cessation Counseling given   Hyponatremia  -Probably secondary to decreased p.o. Intake and N/V and Dehydration -Repeat Na+ was 136 today  -C/w IVF Rehydration -Repeat CMP in AM   Normocytic Anemia -Check Anemia Panel in AM -Hb/Hct went from 11.8/35.2 -> 11.6/33.8 -> 9.1/27.1 -Likely Hemoconcentrated on Admission and Dilutional Drop -Continue to Monitor for S/Sx of Bleeding -Repeat CBC in AM  Severe Malnutrition in the Context of Chronic Illness, Underweight -Nutritionist Consulted for further evaluation and recommendations -C/w Boost Breeze po TID and Magic Cup TIDwm   DVT prophylaxis: Enoxaparin 20 mg sq q24h Code Status: FULL CODE Family Communication: Discussed case with Husband Disposition Plan: SNF when medically stable to D/C.  Consultants:   Psychiatry  Discussed case with outpatient Pain Management Jeanella Anton  Discussed Case with Dr. Evette Doffing of [facility]   Procedures:   Antimicrobials:  Anti-infectives (From admission, onward)   None  Subjective: Seen and examined at bedside and was complaining of hip and abdominal pain. Had tremors and denied CP or SOB. Felt anxious and wanted to smoke.   Objective: Vitals:   04/03/17 2117 04/04/17 0553 04/04/17 0846 04/04/17 1407  BP: 139/85 (!) 119/93  (!) 100/55  Pulse: 95 78  78  Resp: 19 19  15   Temp: 98.5 F (36.9 C) 99.1 F (37.3 C)  98.1 F (36.7 C)  TempSrc: Oral Oral  Oral  SpO2: 93% 100% 98% 100%  Weight:      Height:        Intake/Output Summary (Last 24 hours) at 04/04/2017 1418 Last data filed at 04/04/2017 1401 Gross per 24 hour  Intake 600 ml  Output 2550 ml  Net -1950 ml   Filed Weights   04/03/17 1133 04/03/17 1728  Weight: 31.8 kg (70 lb) 32.4 kg (71 lb 8 oz)   Examination: Physical Exam:  Constitutional: Thin chronically ill appearing cachectic Caucasian female who appears older than stated age who is uncomfortable and tremulous  Eyes: Lids and conjunctivae normal,  sclerae anicteric  ENMT: External Ears, Nose appear normal. Grossly normal hearing. Mucous membranes are slightly dry.  Neck: Appears normal, supple, no cervical masses, normal ROM, no appreciable thyromegaly, no JVD Respiratory: Diminished to auscultation bilaterally, no wheezing, rales, rhonchi or crackles. Normal respiratory effort and patient is not tachypenic. No accessory muscle use.  Cardiovascular: RRR, no murmurs / rubs / gallops. S1 and S2 auscultated. No extremity edema. Abdomen: Soft, Tender to palpate, non-distended. No masses palpated. No appreciable hepatosplenomegaly. Bowel sounds positive x4.  GU: Deferred. Musculoskeletal: No clubbing / cyanosis of digits/nails. No joint deformity upper and lower extremities.  Skin: No rashes, lesions, ulcers on a limited skin eval. No induration; Warm and dry.  Neurologic: CN 2-12 grossly intact with no focal deficits.  Romberg sign and cerebellar reflexes not assessed. Tremulous appearing.  Psychiatric:  Alert and awake. Anxious mood and appropriate affect.   Data Reviewed: I have personally reviewed following labs and imaging studies  CBC: Recent Labs  Lab 04/03/17 1221 04/04/17 0545  WBC 16.8* 5.6  NEUTROABS 14.0*  --   HGB 11.6* 9.1*  HCT 33.8* 27.1*  MCV 89.4 90.6  PLT 214 606   Basic Metabolic Panel: Recent Labs  Lab 04/03/17 1221 04/04/17 0545  NA 128* 136  K 3.9 3.7  CL 96* 108  CO2 20* 23  GLUCOSE 96 83  BUN 14 10  CREATININE 1.97* 1.27*  CALCIUM 9.2 8.2*   GFR: Estimated Creatinine Clearance: 22.9 mL/min (A) (by C-G formula based on SCr of 1.27 mg/dL (H)). Liver Function Tests: Recent Labs  Lab 04/03/17 1221 04/04/17 0545  AST 19 19  ALT 9* 9*  ALKPHOS 89 68  BILITOT 0.7 0.4  PROT 6.7 5.2*  ALBUMIN 3.8 2.8*   Recent Labs  Lab 04/03/17 1221  LIPASE 24   No results for input(s): AMMONIA in the last 168 hours. Coagulation Profile: No results for input(s): INR, PROTIME in the last 168  hours. Cardiac Enzymes: No results for input(s): CKTOTAL, CKMB, CKMBINDEX, TROPONINI in the last 168 hours. BNP (last 3 results) No results for input(s): PROBNP in the last 8760 hours. HbA1C: No results for input(s): HGBA1C in the last 72 hours. CBG: No results for input(s): GLUCAP in the last 168 hours. Lipid Profile: No results for input(s): CHOL, HDL, LDLCALC, TRIG, CHOLHDL, LDLDIRECT in the last 72 hours. Thyroid Function Tests: No results for input(s): TSH, T4TOTAL,  FREET4, T3FREE, THYROIDAB in the last 72 hours. Anemia Panel: No results for input(s): VITAMINB12, FOLATE, FERRITIN, TIBC, IRON, RETICCTPCT in the last 72 hours. Sepsis Labs: No results for input(s): PROCALCITON, LATICACIDVEN in the last 168 hours.  No results found for this or any previous visit (from the past 240 hour(s)).   Radiology Studies: No results found.  Scheduled Meds: . cloNIDine  0.1 mg Oral QID   Followed by  . [START ON 04/05/2017] cloNIDine  0.1 mg Oral BID   Followed by  . [START ON 04/08/2017] cloNIDine  0.1 mg Oral Daily  . enoxaparin (LOVENOX) injection  20 mg Subcutaneous Q24H  . feeding supplement  1 Container Oral TID BM  . lipase/protease/amylase  12,000 Units Oral TID WC  . mometasone-formoterol  2 puff Inhalation BID  . nicotine  21 mg Transdermal Daily  . pantoprazole  40 mg Oral Daily  . tiotropium  18 mcg Inhalation Daily   Continuous Infusions: . sodium chloride 100 mL/hr at 04/04/17 0902    LOS: 0 days   Kerney Elbe, DO [facility] Pager [phone]  If 7PM-7AM, please contact night-coverage [email or web address] Password [email or web address] 04/04/2017, 2:18 PM
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
## 2017-04-04 NOTE — Evaluation (Signed)
Physical Therapy Evaluation Patient Details Name: [name] MRN: [id number] DOB: 1950-08-05 Today's Date: 04/04/2017   History of Present Illness  [name] is a 67 y.o. female with medical history significant of chronic pancreatitis, chronic abdominal pain on p.o. Dilaudid and Ativan at home came in with complaints of nausea vomiting abdominal pain; pt has had 10 ED visists in 6 mos   Clinical Impression  Pt admitted with above diagnosis. Pt currently with functional limitations due to the deficits listed below (see PT Problem List). Difficult to determine pt baseline, from what she tells [place] she spends most of her time in the bed; she has had multiple falls, she is weak and mildly impulsive recommend SNF post acute;  Pt will benefit from skilled PT to increase their independence and safety with mobility to allow discharge to the venue listed below.       Follow Up Recommendations SNF    Equipment Recommendations  None recommended by PT    Recommendations for Other Services       Precautions / Restrictions Precautions Precautions: Fall      Mobility  Bed Mobility Overal bed mobility: Needs Assistance Bed Mobility: Supine to Sit;Sit to Supine     Supine to sit: Supervision Sit to supine: Supervision   General bed mobility comments: for safety, pt moving without awareness of lines  Transfers Overall transfer level: Needs assistance   Transfers: Sit to/from Stand Sit to Stand: Min assist         General transfer comment: cues for safety  Ambulation/Gait Ambulation/Gait assistance: Min guard;Min assist Ambulation Distance (Feet): 11 Feet(x2) Assistive device: 2 person hand held assist Gait Pattern/deviations: Step-through pattern;Decreased stride length     General Gait Details: pt with grossly unsteady gait requiring assist to steady throughout, min/mod assist on initial stand to prevent posterior LOB  Stairs            Wheelchair Mobility     Modified Rankin (Stroke Patients Only)       Balance Overall balance assessment: Needs assistance;History of Falls Sitting-balance support: No upper extremity supported;Feet supported Sitting balance-Leahy Scale: Good       Standing balance-Leahy Scale: Poor                               Pertinent Vitals/Pain Pain Assessment: Faces Faces Pain Scale: Hurts little more Pain Location: stomach Pain Descriptors / Indicators: Grimacing Pain Intervention(s): Monitored during session    Home Living Family/patient expects to be discharged to:: Private residence Living Arrangements: Spouse/significant other(husband works) Available Help at Discharge: Family Type of Home: House         Home Equipment: Environmental consultant - 2 wheels;Bedside commode(? accuracy) Additional Comments: pt is unreliable historian, no family present    Prior Function Level of Independence: Needs assistance;Independent with assistive device(s)   Gait / Transfers Assistance Needed: pt reports she stays in bed most of day, transfers to [facility] and gets back in bed           Hand Dominance        Extremity/Trunk Assessment   Upper Extremity Assessment Upper Extremity Assessment: Generalized weakness    Lower Extremity Assessment Lower Extremity Assessment: Generalized weakness       Communication   Communication: No difficulties  Cognition Arousal/Alertness: Awake/alert Behavior During Therapy: Impulsive Overall Cognitive Status: No family/caregiver present to determine baseline cognitive functioning Area of Impairment: Safety/judgement;Attention;Following commands  Current Attention Level: Sustained   Following Commands: Follows one step commands consistently;Follows multi-step commands with increased time Safety/Judgement: Decreased awareness of safety;Decreased awareness of deficits            General Comments      Exercises     Assessment/Plan     PT Assessment Patient needs continued PT services  PT Problem List Decreased strength;Decreased activity tolerance;Decreased balance;Decreased mobility;Decreased knowledge of use of DME;Decreased safety awareness       PT Treatment Interventions DME instruction;Gait training;Functional mobility training;Therapeutic activities;Therapeutic exercise;Patient/family education;Balance training    PT Goals (Current goals can be found in the Care Plan section)  Acute Rehab PT Goals Patient Stated Goal: none stated PT Goal Formulation: With patient Time For Goal Achievement: 04/18/17 Potential to Achieve Goals: Good    Frequency Min 2X/week   Barriers to discharge        Co-evaluation PT/OT/SLP Co-Evaluation/Treatment: Yes Reason for Co-Treatment: Complexity of the patient's impairments (multi-system involvement) PT goals addressed during session: Mobility/safety with mobility OT goals addressed during session: ADL's and self-care       AM-PAC PT "6 Clicks" Daily Activity  Outcome Measure Difficulty turning over in bed (including adjusting bedclothes, sheets and blankets)?: A Little Difficulty moving from lying on back to sitting on the side of the bed? : A Little Difficulty sitting down on and standing up from a chair with arms (e.g., wheelchair, bedside commode, etc,.)?: Unable Help needed moving to and from a bed to chair (including a wheelchair)?: A Little Help needed walking in hospital room?: A Little Help needed climbing 3-5 steps with a railing? : A Lot 6 Click Score: 15    End of Session   Activity Tolerance: Patient tolerated treatment well Patient left: with call bell/phone within reach;with nursing/sitter in room;in bed;with bed alarm set   PT Visit Diagnosis: Unsteadiness on feet (R26.81);History of falling (Z91.81)    Time: 7416-3845 PT Time Calculation (min) (ACUTE ONLY): 11 min   Charges:   PT Evaluation $PT Eval Low Complexity: 1 Low     PT G CodesKenyon Ana, PT Pager: [phone] 04/04/2017   [facility] 04/04/2017, 1:04 PM

## 2017-04-04 NOTE — Evaluation (Signed)
Occupational Therapy Evaluation Patient Details Name: [name] MRN: [id number] DOB: [birth year] Today's Date: 04/04/2017    History of Present Illness [name] is a 67 y.o. female with medical history significant of chronic pancreatitis, chronic abdominal pain on p.o. Dilaudid and Ativan at home came in with complaints of nausea vomiting abdominal pain; pt has had 10 ED visists in 6 mos    Clinical Impression   Pt admitted with chronic pancreatis.  Pt currently with functional limitations due to the deficits listed below (see OT Problem List).Pt will benefit from skilled OT to increase their safety and independence with ADL and functional mobility for ADL to facilitate discharge to venue listed below.      Follow Up Recommendations  Home health OT;SNF;Supervision/Assistance - 24 hour          Precautions / Restrictions Precautions Precautions: Fall      Mobility Bed Mobility Overal bed mobility: Needs Assistance Bed Mobility: Supine to Sit;Sit to Supine     Supine to sit: Supervision Sit to supine: Supervision   General bed mobility comments: for safety, pt moving without awareness of lines  Transfers Overall transfer level: Needs assistance   Transfers: Sit to/from Stand Sit to Stand: Min assist         General transfer comment: cues for safety    Balance Overall balance assessment: Needs assistance;History of Falls Sitting-balance support: No upper extremity supported;Feet supported Sitting balance-Leahy Scale: Good       Standing balance-Leahy Scale: Poor                             ADL either performed or assessed with clinical judgement   ADL Overall ADL's : Needs assistance/impaired Eating/Feeding: Set up;Sitting   Grooming: Set up;Sitting   Upper Body Bathing: Minimal assistance;Sitting   Lower Body Bathing: Sit to/from stand;Cueing for safety;Cueing for sequencing;+2 for safety/equipment;Maximal assistance   Upper Body  Dressing : Minimal assistance;Sitting   Lower Body Dressing: Sit to/from stand;Cueing for safety;Cueing for sequencing;+2 for physical assistance;+2 for safety/equipment;Maximal assistance       Toileting- Clothing Manipulation and Hygiene: Maximal assistance;Sit to/from stand;Cueing for safety;Cueing for sequencing                            Pertinent Vitals/Pain Pain Assessment: Faces Faces Pain Scale: Hurts little more Pain Location: stomach Pain Descriptors / Indicators: Grimacing Pain Intervention(s): Monitored during session     Hand Dominance     Extremity/Trunk Assessment Upper Extremity Assessment Upper Extremity Assessment: Generalized weakness   Lower Extremity Assessment Lower Extremity Assessment: Generalized weakness       Communication Communication Communication: No difficulties   Cognition Arousal/Alertness: Awake/alert Behavior During Therapy: Impulsive Overall Cognitive Status: No family/caregiver present to determine baseline cognitive functioning Area of Impairment: Safety/judgement;Attention;Following commands                   Current Attention Level: Sustained   Following Commands: Follows one step commands consistently;Follows multi-step commands with increased time Safety/Judgement: Decreased awareness of safety;Decreased awareness of deficits                    Home Living Family/patient expects to be discharged to:: Private residence Living Arrangements: Spouse/significant other(husband works) Available Help at Discharge: Family Type of Home: [employer]  Home Equipment: Taylor - 2 wheels;Bedside commode(? accuracy)   Additional Comments: pt is unreliable historian, no family present      Prior Functioning/Environment Level of Independence: Needs assistance;Independent with assistive device(s)  Gait / Transfers Assistance Needed: pt reports she stays in bed most of day, transfers to [facility]  and gets back in bed              OT Problem List: Decreased strength;Decreased activity tolerance;Impaired balance (sitting and/or standing);Decreased safety awareness;Decreased knowledge of precautions      OT Treatment/Interventions: Self-care/ADL training;Patient/family education;DME and/or AE instruction    OT Goals(Current goals can be found in the care plan section) Acute Rehab OT Goals Patient Stated Goal: none stated OT Goal Formulation: With patient Time For Goal Achievement: 04/18/17  OT Frequency: Min 2X/week   Barriers to D/C:            Co-evaluation PT/OT/SLP Co-Evaluation/Treatment: Yes Reason for Co-Treatment: Complexity of the patient's impairments (multi-system involvement) PT goals addressed during session: Mobility/safety with mobility OT goals addressed during session: ADL's and self-care      AM-PAC PT "6 Clicks" Daily Activity     Outcome Measure Help from another person eating meals?: None Help from another person taking care of personal grooming?: A Little Help from another person toileting, which includes using toliet, bedpan, or urinal?: A Lot Help from another person bathing (including washing, rinsing, drying)?: A Lot Help from another person to put on and taking off regular upper body clothing?: A Little Help from another person to put on and taking off regular lower body clothing?: A Lot 6 Click Score: 16   End of Session Nurse Communication: Mobility status  Activity Tolerance: Patient limited by fatigue Patient left: in bed;with call bell/phone within reach;with nursing/sitter in room  OT Visit Diagnosis: Unsteadiness on feet (R26.81);Repeated falls (R29.6)                Time: 9935-7017 OT Time Calculation (min): 10 min Charges:  OT General Charges $OT Visit: 1 Visit OT Evaluation $OT Eval Moderate Complexity: 1 Mod G-Codes:     Kari Baars, OT [phone]  Payton Mccallum D 04/04/2017, 1:07 PM

## 2017-04-04 NOTE — Progress Notes (Signed)
Initial Nutrition Assessment  DOCUMENTATION CODES:   Severe malnutrition in context of chronic illness, Underweight  INTERVENTION:   Boost Breeze po TID, each supplement provides 250 kcal and 9 grams of protein  Magic cup TID with meals, each supplement provides 290 kcal and 9 grams of protein  NUTRITION DIAGNOSIS:   Severe Malnutrition related to chronic illness(polysubstance abuse, chronic pancreatitis, hx of COPD) as evidenced by severe muscle depletion, severe fat depletion.  GOAL:   Patient will meet greater than or equal to 90% of their needs  MONITOR:   PO intake, Supplement acceptance, Weight trends, I & O's  REASON FOR ASSESSMENT:   Malnutrition Screening Tool, Other (Comment)(Low BMI)   ASSESSMENT:   Pt with PMH of COPD, chronic pancreatitis, polysubstance abuse presents with AKI likely r/t to poor intake and dehydration    Spoke with pt and sitter at bedside.  Pt reports a fair appetite but does not like the taste of the foods and "lost" her teeth so she has difficulty chewing. Sitter reports pt only consumed a couple bites of breakfast this morning. Pt typically consumes only 1 meal and 3-4 candy pieces per day. Pt states she also consumes 6 cans of Coke daily.   Pt endorses weight continuous weight loss over the past year. Per weight records, pt has been chronically underweight and identified with malnutrition multiple times.   Pt denies any nutrition impact symptoms currently except for occasional nausea.   Pt refuses "milky" oral nutrition supplements but is amenable to [employer] and [employer] daily.   Labs reviewed; Albumin 2.8, Hemoglobin 9.1 Medications reviewed; Creon, Protonix, NS at 100 mL/hr   NUTRITION - FOCUSED PHYSICAL EXAM:    Most Recent Value  Orbital Region  Severe depletion  Upper Arm Region  Severe depletion  Thoracic and Lumbar Region  Severe depletion  Buccal Region  Severe depletion  Temple Region  Severe depletion  Clavicle  Bone Region  Severe depletion  Clavicle and Acromion Bone Region  Severe depletion  Scapular Bone Region  Severe depletion  Dorsal Hand  Severe depletion  Patellar Region  Severe depletion  Anterior Thigh Region  Severe depletion  Posterior Calf Region  Severe depletion  Edema (RD Assessment)  None     Diet Order:  Diet regular Room service appropriate? Yes; Fluid consistency: Thin  EDUCATION NEEDS:   Not appropriate for education at this time  Skin:  Skin Assessment: Skin Integrity Issues: Skin Integrity Issues:: Stage II Stage II: ischial tuberosity  Last BM:  Unknown BM date  Height:   Ht Readings from Last 1 Encounters:  04/03/17 5\' 4"  (1.626 m)   Weight:   Wt Readings from Last 1 Encounters:  04/03/17 71 lb 8 oz (32.4 kg)   Ideal Body Weight:  54.5 kg  BMI:  Body mass index is 12.27 kg/m.  Estimated Nutritional Needs:   Kcal:  1400-1600  Protein:  65-75 grams  Fluid:  >/= 1.5 L/d  Parks Ranger, MS, RDN, LDN 04/04/2017 12:05 PM

## 2017-04-05 DIAGNOSIS — R1084 Generalized abdominal pain: Secondary | ICD-10-CM

## 2017-04-05 DIAGNOSIS — F411 Generalized anxiety disorder: Secondary | ICD-10-CM

## 2017-04-05 DIAGNOSIS — F1910 Other psychoactive substance abuse, uncomplicated: Secondary | ICD-10-CM

## 2017-04-05 DIAGNOSIS — F13239 Sedative, hypnotic or anxiolytic dependence with withdrawal, unspecified: Secondary | ICD-10-CM

## 2017-04-05 DIAGNOSIS — R636 Underweight: Secondary | ICD-10-CM

## 2017-04-05 DIAGNOSIS — F1120 Opioid dependence, uncomplicated: Secondary | ICD-10-CM

## 2017-04-05 LAB — COMPREHENSIVE METABOLIC PANEL
ALT: 10 U/L — AB (ref 14–54)
AST: 19 U/L (ref 15–41)
Albumin: 3 g/dL — ABNORMAL LOW (ref 3.5–5.0)
Alkaline Phosphatase: 76 U/L (ref 38–126)
Anion gap: 4 — ABNORMAL LOW (ref 5–15)
BUN: 7 mg/dL (ref 6–20)
CHLORIDE: 109 mmol/L (ref 101–111)
CO2: 23 mmol/L (ref 22–32)
CREATININE: 0.87 mg/dL (ref 0.44–1.00)
Calcium: 8.7 mg/dL — ABNORMAL LOW (ref 8.9–10.3)
GFR calc Af Amer: >60 mL/min (ref >60)
GFR calc non Af Amer: >60 mL/min (ref >60)
Glucose, Bld: 102 mg/dL — ABNORMAL HIGH (ref 65–99)
POTASSIUM: 4.1 mmol/L (ref 3.5–5.1)
SODIUM: 136 mmol/L (ref 135–145)
Total Bilirubin: 0.3 mg/dL (ref 0.3–1.2)
Total Protein: 5.5 g/dL — ABNORMAL LOW (ref 6.5–8.1)

## 2017-04-05 LAB — CBC WITH DIFFERENTIAL/PLATELET
BASOS ABS: 0 10*3/uL (ref 0.0–0.1)
BASOS PCT: 0 %
EOS ABS: 0.1 10*3/uL (ref 0.0–0.7)
EOS PCT: 2 %
HCT: 28.7 % — ABNORMAL LOW (ref 36.0–46.0)
Hemoglobin: 9.6 g/dL — ABNORMAL LOW (ref 12.0–15.0)
Lymphocytes Relative: 26 %
Lymphs Abs: 1.2 10*3/uL (ref 0.7–4.0)
MCH: 31 pg (ref 26.0–34.0)
MCHC: 33.4 g/dL (ref 30.0–36.0)
MCV: 92.6 fL (ref 78.0–100.0)
Monocytes Absolute: 0.3 10*3/uL (ref 0.1–1.0)
Monocytes Relative: 6 %
Neutro Abs: 2.9 10*3/uL (ref 1.7–7.7)
Neutrophils Relative %: 66 %
PLATELETS: 162 10*3/uL (ref 150–400)
RBC: 3.1 MIL/uL — AB (ref 3.87–5.11)
RDW: 14.9 % (ref 11.5–15.5)
WBC: 4.5 10*3/uL (ref 4.0–10.5)

## 2017-04-05 LAB — PHOSPHORUS: PHOSPHORUS: 3.4 mg/dL (ref 2.5–4.6)

## 2017-04-05 LAB — MAGNESIUM: Magnesium: 1.7 mg/dL (ref 1.7–2.4)

## 2017-04-05 MED ORDER — LORAZEPAM 2 MG/ML IJ SOLN
0.5000 mg | Freq: Two times a day (BID) | INTRAMUSCULAR | Status: DC | PRN
  Administered 2017-04-05 – 2017-04-06 (×2): 0.5 mg via INTRAVENOUS
  Filled 2017-04-05 (×2): qty 1

## 2017-04-05 MED ORDER — HYDROMORPHONE HCL 1 MG/ML IJ SOLN
0.5000 mg | Freq: Three times a day (TID) | INTRAMUSCULAR | Status: DC | PRN
  Administered 2017-04-05 – 2017-04-07 (×6): 0.5 mg via INTRAVENOUS
  Filled 2017-04-05 (×6): qty 0.5

## 2017-04-05 NOTE — Progress Notes (Signed)
PROGRESS NOTE    [name]  MPN:[id number] DOB: 04/03/1950 DOA: 04/03/2017 PCP: Scot Jun, FNP   Brief Narrative:  HPI per Dr. Landis Gandy: [name] is a 67 y.o. female with medical history significant of chronic pancreatitis, chronic abdominal pain on p.o. Dilaudid and Ativan at home came in with complaints of nausea vomiting abdominal pain and not able to keep any food down.  Patient lives at home with her husband.  She is not a good historian.  She told me that as soon as long as she gets her pain medicine and Ativan she will be fine.  She asked me about her pain medication abdomen multiple times while I was in the room with her.  The history is not very clear. I had a long conversation with her husband husband's phone number is [phone].  Patient reported that his wife was on Dilaudid and Ativan for over 20 years.  She got Dilaudid 120 tablets/month and 90 tablets of Ativan.  This was prescribed by Dr. Alyson Ingles.  Now she has new pain management doctor by the name of Jeanella Anton who has prescribed her oxycodone 10 mg.  The husband usually keeps in charge of all his medications or he tries to keep in charge of his other medications but today she got very upset with him and asked him to give her the whole bottle and she took the whole bottle with her.  When the husband came back home she was acting crazy shaking mad on the floor of the kitchen he called EMS and brought her to the hospital.  He thinks that she has taken 10 oxycodone tablets but he is not sure.  According to him she is addicted to these pills for over 20 years.  Even after taking all these pills she is still screams out in the night with pain apparently.  She saw a psychiatrist Dr. Darleene Cleaver who recently prescribed her Elavil 25 mg.  The husband reports that patient cannot tolerate all these medications at the same time.  Patient has had 10 ED visits in the last 6 months.  He said when she runs out of her  medications early she comes to the ER and she gets a dose of Dilaudid and Ativan and discharged back home this has been happening many times in the last 6 months.  ED Course: Patient received Ativan 2 mg.  Her sodium was 128 potassium 3.9 BUN 14 creatinine 1.97.  Her creatinine was has bumped up from 0.94 on March 08, 2017 to 1.97 today.  White count 16.8 hemoglobin 11.6, platelet count 214.  Her Tylenol level was less than 10 salicylate level was less than 7.  Glucose was 96, UA was negative for UTI, urine drug screen was positive for benzodiazepines and opiates.  **Seen and was having tremors and complaining of Abdominal Pain. It appears she is withdrawing so restarted IV dilaudid and weaning. Psych consulted for further evaluation and recommendations given her Overdose of Oxycodone and Elavil.   She appeared improved today and will do Foley TOV today and continue to Wean IV Narcotics. PT evaluated and recommended SNF Placement so willl consult Social work for further assistance. Have started cutting back on IV Ativan and IV dilaudid and appreciated Psychiatry recommendations.   Assessment & Plan:   Principal Problem:   Benzodiazepine dependence (HCC) Active Problems:   Anxiety state   Nausea and vomiting   Benzodiazepine withdrawal (HCC)   Tobacco abuse  Abdominal pain   Hyponatremia   Underweight   COPD (chronic obstructive pulmonary disease) (HCC)   Narcotic addiction (HCC)   Abdominal pain, chronic, epigastric- due to chronic pancreatitis   AKI (acute kidney injury) ([place])   Acute urinary retention  Polysubstance Abuse/Polypharmacy/Overdose concern for Opiate and Benzo Withdrawal  -Patient has been used to taking Dilaudid and Ativan for over 20 years; -Now on Oxycodone and Amitriptyline as an outpatient   -She clearly has a drug-seeking behavior.   -Called her pain management Provider Jeanella Anton who states that because the patient overdosed and who didn't take her Oxycodone  like she should would not accept her back as a patient and will refer her to Inpatient Treatment facility once she is discharged from the Hospital.   -Her psychiatrist is Dr. Darleene Cleaver and per husband she has been taking her Amitriptyline 25 mg 3-4x a day -She runs out of her pain medications way before she is supposed to and if she gets a 1 month supply she finishes off in 2 weeks.   -She was was started on IV Ativan 0.5 mg q8hprn and increased to IV 0.5 mg q6hprn Anxiety and Agitation yesterday but have started weaning and now on IV 0.5 mg q12hprn Anxiety  -C/w Hydroxyzine 25 mg po q6hprn Anxiety  -Was also started on Oxycodone 5 mg q6hprn for Severe Pain and will continue  -Concern for Benzo and Opiate withdrawal so Added IV 0.5 mg Hydromorphone q6hprn and now weaing that to 0.5 mg IV q8hprn Severe Pain  -Started on Clonidine Taper 0.1 mg as outlined -Consulted Psychiatry for Further Evaluation and recommendations given Overdose (Likely unintentional)  -No SI/HI currently  -EtOH Level was <48 and Salicylate Level was <5.4 -UDS Positive for Benzodizepines and Opiates  -Had a Sitter 1:1 and now Discontinued  -Discussed Case with Dr. Evette Doffing of [facility] and he states make referral as outpatient to [phone] when patient is discharged  -Continue to Monitor Withdrawal Symptoms carefully and c/w Loperamide 2-4 mg po prn Diarrhea or Loose stools  -Anti Emetics with po Zofran q6hprn and IV Zofran 4 mg q8hprn  -Psychiatry evaluated and appreciated evaluation; Recommended Gabapentin 300 mg po TID as well as agreeing with tapering Ativan due to Misuse -Also recommended not restarting Amitriptyline and having the SW provide patient with resources for substance abuse treatment -Continue to Wean Ativan and Dilaudid -PT/OT recommending SNF -Social Worker consulted for placement and will see if patient willing to go to SNF  AKI secondary to Decreased p.o. Intake, Nausea Vomiting and Dehydration.     -Patient presented with intractable nausea, vomiting, and abdominal pain and chronic body pain.  -Baseline Cr normal and was Normal on 3/15 -BUN/Cr went from 14/1.97 ->7/0.87 -C/w IVF Rehydration with NS at 100 mL/hr -Avoid Nephrotoxins if Possible -Repeat CMP in AM  Acute Urinary Retention -Likely 2/2 to Anticholinergic Effects from TCA effects -Hold Amitriptyline indefinitely  -Foley Cathter Inserted and will try TOV today  -C/w IVF Rehydration as above  Chronic Pancreatitis with Abdominal Pain -Normal lipase at this time. -C/w IV fluids at 100 mL/hr -C/w Dicyclomine 20 mg po q6hprn Spasms and Abdominal Cramping x 5 days  -C/w Lipase/Protease/Amylase 12,000 units TIDwm -Pain Control as above and Wean IV Dilaudid and IV Ativan   COPD -Currently not in Exacerbation as she does not have any wheezing or or cough -Added DuoNeb 3 mL q6hprn Wheezing and SOB -Continue home inhalers with Dulera  -C/w Tiotropium Inhalation capsule 18 mcg Daily  Tobacco Abuse  -Added Nicotine Patch 21 mg TD q24h -Smoking Cessation Counseling given   Hyponatremia  -Probably secondary to decreased p.o. Intake and N/V and Dehydration -Repeat Na+ was 136 today  -C/w IVF Rehydration as above -Repeat CMP in AM   Normocytic Anemia -Check Anemia Panel in AM -Hb/Hct went from 11.8/35.2 -> 11.6/33.8 -> 9.1/27.1 -> 9.6/28.7 -Likely Hemoconcentrated on Admission and Dilutional Drop -Continue to Monitor for S/Sx of Bleeding -Repeat CBC in AM  Severe Malnutrition in the Context of Chronic Illness, Underweight -Nutritionist Consulted for further evaluation and recommendations -Albumin was 3.0 -C/w Boost Breeze po TID and Magic Cup TIDwm   DVT prophylaxis: Enoxaparin 20 mg sq q24h Code Status: FULL CODE Family Communication: Discussed case with Husband Disposition Plan: SNF when medically stable to D/C  Consultants:   Psychiatry Dr. Mariea Clonts  Discussed case with outpatient Pain Management  Jeanella Anton  Discussed Case with Dr. Evette Doffing of [facility]   Procedures:   Antimicrobials:  Anti-infectives (From admission, onward)   None     Subjective: Seen and examined at bedside and was calmer today. Complaining of Right Hip pain today. Not as tremulous. No CP or SOB.   Objective: Vitals:   04/04/17 1407 04/04/17 2033 04/04/17 2035 04/05/17 0542  BP: (!) 100/55  120/74   Pulse: 78  99   Resp: 15  15   Temp: 98.1 F (36.7 C)  (!) 97.3 F (36.3 C)   TempSrc: Oral  Oral   SpO2: 100% 98% 93% 99%  Weight:      Height:        Intake/Output Summary (Last 24 hours) at 04/05/2017 1214 Last data filed at 04/05/2017 1100 Gross per 24 hour  Intake 956.67 ml  Output 2050 ml  Net -1093.33 ml   Filed Weights   04/03/17 1133 04/03/17 1728  Weight: 31.8 kg (70 lb) 32.4 kg (71 lb 8 oz)   Examination: Physical Exam:  Constitutional: Thin chronically ill appearing cachectic Caucasian female in NAD and appears more comfortable today; Not tremulous today  Eyes: Sclerae anicteric. Lids normal ENMT: MMM; External Ears and nose appear normal Neck: Supple with no JVD Respiratory: Diminished to auscultation; Unlabored breathing; No appreciable wheezing/rales/rhonchi; No appreciable crackls Cardiovascular: RRR; No appreciable m/r/g. No LE edema Abdomen: Soft, Tender to palpate. ND. Bowel sounds positive  GU: Deferred Musculoskeletal: No contractures; No cyanosis Skin: Warm and dry; No appreciable rashes or lesions on a limited skin evaluation but had a skin tear and bruising on Right lower leg Neurologic: CN 2-12 grossly intact. No appreciable focal deficits Psychiatric: Was sleeping but awoken from sleep. Was not as anxious today. Appropriate affect.   Data Reviewed: I have personally reviewed following labs and imaging studies  CBC: Recent Labs  Lab 04/03/17 1221 04/04/17 0545 04/05/17 0600  WBC 16.8* 5.6 4.5  NEUTROABS 14.0*  --  2.9  HGB 11.6* 9.1* 9.6*  HCT  33.8* 27.1* 28.7*  MCV 89.4 90.6 92.6  PLT 214 161 588   Basic Metabolic Panel: Recent Labs  Lab 04/03/17 1221 04/04/17 0545 04/05/17 0600  NA 128* 136 136  K 3.9 3.7 4.1  CL 96* 108 109  CO2 20* 23 23  GLUCOSE 96 83 102*  BUN 14 10 7   CREATININE 1.97* 1.27* 0.87  CALCIUM 9.2 8.2* 8.7*  MG  --   --  1.7  PHOS  --   --  3.4   GFR: Estimated Creatinine Clearance: 33.4 mL/min (by C-G formula based on  SCr of 0.87 mg/dL). Liver Function Tests: Recent Labs  Lab 04/03/17 1221 04/04/17 0545 04/05/17 0600  AST 19 19 19   ALT 9* 9* 10*  ALKPHOS 89 68 76  BILITOT 0.7 0.4 0.3  PROT 6.7 5.2* 5.5*  ALBUMIN 3.8 2.8* 3.0*   Recent Labs  Lab 04/03/17 1221  LIPASE 24   No results for input(s): AMMONIA in the last 168 hours. Coagulation Profile: No results for input(s): INR, PROTIME in the last 168 hours. Cardiac Enzymes: No results for input(s): CKTOTAL, CKMB, CKMBINDEX, TROPONINI in the last 168 hours. BNP (last 3 results) No results for input(s): PROBNP in the last 8760 hours. HbA1C: No results for input(s): HGBA1C in the last 72 hours. CBG: No results for input(s): GLUCAP in the last 168 hours. Lipid Profile: No results for input(s): CHOL, HDL, LDLCALC, TRIG, CHOLHDL, LDLDIRECT in the last 72 hours. Thyroid Function Tests: No results for input(s): TSH, T4TOTAL, FREET4, T3FREE, THYROIDAB in the last 72 hours. Anemia Panel: No results for input(s): VITAMINB12, FOLATE, FERRITIN, TIBC, IRON, RETICCTPCT in the last 72 hours. Sepsis Labs: No results for input(s): PROCALCITON, LATICACIDVEN in the last 168 hours.  No results found for this or any previous visit (from the past 240 hour(s)).   Radiology Studies: No results found.  Scheduled Meds: . cloNIDine  0.1 mg Oral QID   Followed by  . cloNIDine  0.1 mg Oral BID   Followed by  . [START ON 04/08/2017] cloNIDine  0.1 mg Oral Daily  . enoxaparin (LOVENOX) injection  20 mg Subcutaneous Q24H  . feeding supplement  1  Container Oral TID BM  . gabapentin  300 mg Oral TID  . lipase/protease/amylase  12,000 Units Oral TID WC  . mometasone-formoterol  2 puff Inhalation BID  . nicotine  21 mg Transdermal Daily  . pantoprazole  40 mg Oral Daily  . tiotropium  18 mcg Inhalation Daily   Continuous Infusions:   LOS: 1 day   Kerney Elbe, DO [facility] Pager [phone]  If 7PM-7AM, please contact night-coverage [email or web address] Password [facility] 04/05/2017, 12:14 PM

## 2017-04-05 NOTE — Clinical Social Work Note (Signed)
Clinical Social Work Assessment  Patient Details  Name: [name] MRN: [id number] Date of Birth: [birth year]  Date of referral:  04/05/17               Reason for consult:  Facility Placement                Permission sought to share information with:    Permission granted to share information::  No  Name::        Agency::     Relationship::     Contact Information:     Housing/Transportation Living arrangements for the past 2 months:  Single Family Home Source of Information:  Patient Patient Interpreter Needed:  None Criminal Activity/Legal Involvement Pertinent to Current Situation/Hospitalization:  No - Comment as needed Significant Relationships:  Spouse Lives with:  Spouse Do you feel safe going back to the place where you live?  Yes Need for family participation in patient care:  Yes (Comment)  Care giving concerns:  No care giving concerns.    Social Worker assessment / plan:  LCSW consulted for SNF placement.   Patient admitted for pain and possible OD.  Patient was cleared by psych.   PT evaluated patient and recommended SNF.   LCSW met with patient at bedside. No family present. RNCM accompanied LCSW.   LCSW explained rehab recommendation and payor source since patient has Medicaid. Patient is not willing to go to rehab for 30 days. Patient prefers home health PT.  PLAN: Patient will go home with HH at dc.   Employment status:  Disabled (Comment on whether or not currently receiving Disability) Insurance information:  Medicaid In [place] PT Recommendations:  [place] / Referral to community resources:  [place]  Patient/Family's Response to care:  Patient is thankful for LCSW visit.   Patient/Family's Understanding of and Emotional Response to Diagnosis, Current Treatment, and Prognosis:  Patient is understanding of diagnosis. Patient prefers to go home vs. SNF.   Emotional Assessment Appearance:  Appears  older than stated age Attitude/Demeanor/Rapport:    Affect (typically observed):  Calm Orientation:  Oriented to Self, Oriented to Place, Oriented to  Time, Oriented to Situation Alcohol / Substance use:  Illicit Drugs(Perscription) Psych involvement (Current and /or in the community):  No (Comment)  Discharge Needs  Concerns to be addressed:  No discharge needs identified Readmission within the last 30 days:  No Current discharge risk:  None Barriers to Discharge:  No Barriers Identified   Servando Snare, LCSW 04/05/2017, 3:00 PM

## 2017-04-05 NOTE — Care Management Note (Signed)
Case Management Note  Patient Details  Name: [name] MRN: [id number] Date of Birth: 1950-07-02  Subjective/Objective:                    Action/Plan:   Expected Discharge Date:  04/04/17               Expected Discharge Plan:  [place]  In-House Referral:  Clinical Social Work  Discharge planning Services  CM Consult  Post Acute Care Choice:    Choice offered to:  Patient  DME Arranged:    DME Agency:     HH Arranged:  RN, PT [place] Agency:  [facility]  Status of Service:  Completed, signed off  If discussed at [place] of Stay Meetings, dates discussed:    Additional CommentsPurcell Mouton, RN 04/05/2017, 3:24 PM

## 2017-04-06 ENCOUNTER — Other Ambulatory Visit: Payer: Medicaid Other

## 2017-04-06 DIAGNOSIS — L89319 Pressure ulcer of right buttock, unspecified stage: Secondary | ICD-10-CM | POA: Diagnosis present

## 2017-04-06 DIAGNOSIS — L8990 Pressure ulcer of unspecified site, unspecified stage: Secondary | ICD-10-CM

## 2017-04-06 LAB — CBC WITH DIFFERENTIAL/PLATELET
Basophils Absolute: 0 10*3/uL (ref 0.0–0.1)
Basophils Relative: 1 %
EOS ABS: 0.1 10*3/uL (ref 0.0–0.7)
Eosinophils Relative: 2 %
HEMATOCRIT: 32.9 % — AB (ref 36.0–46.0)
HEMOGLOBIN: 10.7 g/dL — AB (ref 12.0–15.0)
LYMPHS ABS: 1.3 10*3/uL (ref 0.7–4.0)
Lymphocytes Relative: 24 %
MCH: 30.6 pg (ref 26.0–34.0)
MCHC: 32.5 g/dL (ref 30.0–36.0)
MCV: 94 fL (ref 78.0–100.0)
MONO ABS: 0.4 10*3/uL (ref 0.1–1.0)
MONOS PCT: 8 %
NEUTROS ABS: 3.7 10*3/uL (ref 1.7–7.7)
NEUTROS PCT: 65 %
Platelets: 186 10*3/uL (ref 150–400)
RBC: 3.5 MIL/uL — ABNORMAL LOW (ref 3.87–5.11)
RDW: 15.1 % (ref 11.5–15.5)
WBC: 5.6 10*3/uL (ref 4.0–10.5)

## 2017-04-06 LAB — COMPREHENSIVE METABOLIC PANEL
ALK PHOS: 90 U/L (ref 38–126)
ALT: 10 U/L — ABNORMAL LOW (ref 14–54)
ANION GAP: 7 (ref 5–15)
AST: 17 U/L (ref 15–41)
Albumin: 3.4 g/dL — ABNORMAL LOW (ref 3.5–5.0)
BILIRUBIN TOTAL: 0.4 mg/dL (ref 0.3–1.2)
BUN: 7 mg/dL (ref 6–20)
CALCIUM: 9.3 mg/dL (ref 8.9–10.3)
CO2: 22 mmol/L (ref 22–32)
Chloride: 111 mmol/L (ref 101–111)
Creatinine, Ser: 0.87 mg/dL (ref 0.44–1.00)
GFR calc non Af Amer: >60 mL/min (ref >60)
Glucose, Bld: 73 mg/dL (ref 65–99)
POTASSIUM: 4.1 mmol/L (ref 3.5–5.1)
Sodium: 140 mmol/L (ref 135–145)
TOTAL PROTEIN: 6.6 g/dL (ref 6.5–8.1)

## 2017-04-06 LAB — MAGNESIUM: Magnesium: 1.6 mg/dL — ABNORMAL LOW (ref 1.7–2.4)

## 2017-04-06 LAB — PHOSPHORUS: PHOSPHORUS: 3.1 mg/dL (ref 2.5–4.6)

## 2017-04-06 MED ORDER — LORAZEPAM 0.5 MG PO TABS
0.5000 mg | ORAL_TABLET | Freq: Two times a day (BID) | ORAL | Status: DC | PRN
  Administered 2017-04-06 – 2017-04-11 (×9): 0.5 mg via ORAL
  Filled 2017-04-06 (×9): qty 1

## 2017-04-06 MED ORDER — TAMSULOSIN HCL 0.4 MG PO CAPS
0.4000 mg | ORAL_CAPSULE | Freq: Every day | ORAL | Status: DC
  Administered 2017-04-06 – 2017-04-11 (×6): 0.4 mg via ORAL
  Filled 2017-04-06 (×6): qty 1

## 2017-04-06 MED ORDER — OXYCODONE HCL ER 10 MG PO T12A
10.0000 mg | EXTENDED_RELEASE_TABLET | Freq: Two times a day (BID) | ORAL | Status: DC
  Administered 2017-04-06: 10 mg via ORAL
  Filled 2017-04-06: qty 1

## 2017-04-06 MED ORDER — MAGNESIUM SULFATE 4 GM/100ML IV SOLN
4.0000 g | Freq: Once | INTRAVENOUS | Status: AC
  Administered 2017-04-06: 4 g via INTRAVENOUS
  Filled 2017-04-06: qty 100

## 2017-04-06 NOTE — Progress Notes (Signed)
Occupational Therapy Treatment Patient Details Name: [name] MRN: [id number] DOB: [birth year] Today's Date: 04/06/2017    History of present illness [name] is a 67 y.o. female with medical history significant of chronic pancreatitis, chronic abdominal pain on p.o. Dilaudid and Ativan at home came in with complaints of nausea vomiting abdominal pain; pt has had 10 ED visists in 6 mos    OT comments  Pt refusing SNF. Has limited A at home.  Pt very reliant on pain meds.  Limited participation with therapy.               Mobility Bed Mobility Overal bed mobility: Needs Assistance Bed Mobility: Supine to Sit;Sit to Supine     Supine to sit: Supervision Sit to supine: Supervision   General bed mobility comments: for safety, pt moving without awareness of lines  Transfers Overall transfer level: Needs assistance   Transfers: Sit to/from Stand;Stand Pivot Transfers Sit to Stand: Min assist Stand pivot transfers: Mod assist       General transfer comment: cues for safety    Balance Overall balance assessment: Needs assistance;History of Falls Sitting-balance support: No upper extremity supported;Feet supported Sitting balance-Leahy Scale: Good       Standing balance-Leahy Scale: Poor                             ADL either performed or assessed with clinical judgement   ADL Overall ADL's : Needs assistance/impaired     Grooming: Set up;Sitting                   Toilet Transfer: Moderate assistance;Stand-pivot;Cueing for sequencing;Cueing for safety   Toileting- Clothing Manipulation and Hygiene: Moderate assistance;Sit to/from stand         General ADL Comments: pt abruptly ended OT session stating she needed her pain meds. Rn aware     Vision Patient Visual Report: No change from baseline     Perception     Praxis      Cognition Arousal/Alertness: Awake/alert Behavior During Therapy: Impulsive Overall Cognitive  Status: No family/caregiver present to determine baseline cognitive functioning Area of Impairment: Safety/judgement;Attention;Following commands                   Current Attention Level: Sustained   Following Commands: Follows one step commands consistently;Follows multi-step commands with increased time Safety/Judgement: Decreased awareness of safety;Decreased awareness of deficits                         Pertinent Vitals/ Pain       Pain Score: 7  Pain Location: stomach Pain Descriptors / Indicators: Sore Pain Intervention(s): Limited activity within patient's tolerance     Prior Functioning/Environment              Frequency           Progress Toward Goals  OT Goals(current goals can now be found in the care plan section)  Progress towards OT goals: OT to reassess next treatment     Plan Discharge plan remains appropriate    Co-evaluation                 AM-PAC PT "6 Clicks" Daily Activity     Outcome Measure   Help from another person eating meals?: None Help from another person taking care of personal grooming?: A Little Help from another person toileting, which includes using  toliet, bedpan, or urinal?: A Lot Help from another person bathing (including washing, rinsing, drying)?: A Little Help from another person to put on and taking off regular upper body clothing?: A Lot Help from another person to put on and taking off regular lower body clothing?: A Little 6 Click Score: 17    End of Session Equipment Utilized During Treatment: Gait belt      Activity Tolerance Patient tolerated treatment well   Patient Left in bed;with call bell/phone within reach;with bed alarm set(bed alarm would not set due to decerased weight- RN aware)   Nurse Communication Mobility status        Time: 4604-7998 OT Time Calculation (min): 10 min  Charges: OT General Charges $OT Visit: 1 Visit OT Treatments $Self Care/Home Management : 8-22  mins  04/06/2017  Kari Baars, [place]   Payton Mccallum D 04/06/2017, 2:20 PM

## 2017-04-06 NOTE — Progress Notes (Addendum)
PROGRESS NOTE    [name]  HKV:[id number] DOB: Jan 16, 1950 DOA: 04/03/2017 PCP: Janice Jun, FNP   Brief Narrative:  HPI per [name]: [name] a 67 y.o.femalewith medical history significant ofchronic pancreatitis, chronic abdominal pain on p.o. Dilaudid and Ativan at home came in with complaints of nausea vomiting abdominal pain and not able to keep any food down. Patient lives at home with her husband. She is not a good historian. She told me that as soon as long as she gets her pain medicine and Ativan she will be fine.She asked me about her pain medication abdomen multiple times while I was in the room with her. The history is not very clear. I had a long conversation with her husband husband's phone number is [phone]. Patient reported that his wife was on Dilaudid and Ativan for over 20 years. She got Dilaudid 120 tablets/month and 90 tablets of Ativan. This was prescribed by [name]. Now she has new pain management doctor by the name of [name] who has prescribed her oxycodone 10 mg. The husband usually keeps in charge of all his medications or he tries to keep in charge of his other medications but today she got very upset with him and asked him to give her the whole bottle and she took the whole bottle with her. When the husband came back home she was acting crazy shaking mad on the floor of the kitchen he called EMS and brought her to the hospital. He thinks that she has taken 10 oxycodone tablets but he is not sure. According to him she is addicted to these pills for over 20 years. Even after taking all these pills she is still screams out in the night with pain apparently. She saw a psychiatrist [name] who recently prescribed her Elavil 25 mg. The husband reports that patient cannot tolerate all these medications at the same time. Patient has had 10 ED visits in the last 6 months. He said when she runs out of  her medications early she comes to the ER and she gets a dose of Dilaudid and Ativan and discharged back home this has been happening many times in the last 6 months.  ED Course:Patient received Ativan 2 mg. Her sodium was 128 potassium 3.9 BUN 14 creatinine 1.97. Her creatinine was has bumped up from 0.94 on March 08, 2017 to 1.97 today. White count 16.8 hemoglobin 11.6, platelet count 214. Her Tylenol level was less than 10 salicylate level was less than 7. Glucose was 96, UA was negative for UTI, urine drug screen was positive for benzodiazepines and opiates.  **Seen and was having tremors and complaining of Abdominal Pain. It appears she is withdrawing so restarted IV dilaudid and weaning. Psych consulted for further evaluation and recommendations given her Overdose of Oxycodone and Elavil.   She appeared improved today and will do Foley TOV today and continue to Wean IV Narcotics. PT evaluated and recommended SNF Placement so willl consult Social work for further assistance. Have started cutting back on IV Ativan and IV dilaudid and appreciated Psychiatry recommendatio     Assessment & Plan:   Principal Problem:   Benzodiazepine dependence (HCC) Active Problems:   Anxiety state   Nausea and vomiting   Benzodiazepine withdrawal (HCC)   Tobacco abuse   Abdominal pain   Hyponatremia   Underweight   COPD (chronic obstructive pulmonary disease) (HCC)   Narcotic addiction (HCC)   Abdominal pain, chronic, epigastric-  due to chronic pancreatitis   AKI (acute kidney injury) ([place])   Acute urinary retention   Pressure injury of skin   Pressure injury of skin of right ischial tuberosity region: Stage 2  #1 polysubstance abuse/polypharmacy/overdose concern for opiate and benzo withdrawal Patient noted to have been on Dilaudid and Ativan for greater than 20 years.  Patient noted on admission to be on oxycodone and amitriptyline as an outpatient.  Patient also noted clearly with  drug-seeking behavior.  [name] called and spoke with patient's pain management provider JaniceJanelle Donovan [name] who states that because the patient overdosed and who didn't take her Oxycodone like she should would not accept her back as a patient and will refer her to Inpatient Treatment facility once she is discharged from the Hospital.  -Her psychiatrist is [name] and per husband she has been taking her Amitriptyline 25 mg 3-4x a day -She runs out of her pain medications way before she is supposed to and if she gets a 1 month supply she finishes off in 2 weeks.  Ativan IV 0.5 mg every 8 hours as needed and increase to every 6 hours as needed for anxiety and agitation however has subsequently been weaned to every 12 hours as needed.  Will change IV Ativan to oral Ativan twice daily as needed.  Continue hydroxyzine as needed.  Patient currently on oxycodone 5 mg every 6 hours as needed pain which we will continue.  Initially started OxyContin 10 mg twice daily, however due to recent OD will discontinue.  Continue IV hydromorphone every 8 hours as needed severe pain as there was some concern for benzo and opiate withdrawal.  Patient on clonidine taper.  Patient with no suicidal or homicidal ideations.  Alcohol level less than 10.  Salicylate level less than 7.  Patient had a one-to-one sitter which was subsequently discontinued after patient was seen in consultation by psychiatry.  Psychiatry also recommended discontinuation of Elavil. Case was discussed with [name] who stated outpatient referral to [phone] when patient was discharged. -Continue to monitor for withdrawal.  Continue Ativan withdrawal protocol. -Psychiatry recommended not restarting Elavil.  Continue to wean IV Dilaudid.  2.  Urinary retention Patient noted to have urinary retention felt likely secondary to anticholinergic effects from tricyclics.  Elavil has been discontinued indefinitely.  Voiding trial was  tried yesterday however patient with no significant urine output.  Place Foley catheter.  Start Flomax.  Will likely need outpatient follow-up with urology.  3.  Severe protein calorie malnutrition Nutritional supplementation.  4.  Acute kidney injury Secondary to prerenal azotemia secondary to poor oral intake, nausea vomiting dehydration, chronic body pain.  Continue to hold nephrotoxins.  Renal function improved with hydration.  5.  Hypomagnesemia Replete.  6.  Chronic pancreatitis with abdominal pain Lipase normal.  Patient currently stable and tolerating oral intake.  Continue IV fluids.  Continue dicyclomine as needed for spasms and abdominal cramping.  Continue Creon with meals.  7.  COPD Stable.  Continue Dulera, Spiriva, nebs as needed.  8.  Tobacco abuse Tobacco cessation.  Continue nicotine patch.  9.  Hyponatremia Secondary to hypovolemic hyponatremia.  Improved with hydration.  Follow.  10.  Normocytic anemia Likely dilutional component.  Patient with no signs or symptoms of bleeding.  H&H stable.  Outpatient follow-up.  11.  Pressure injury stage II right ischial tuberosity Continue current wound care.    DVT prophylaxis: Lovenox Code Status: Full Family Communication: Updated patient.  No  family at bedside. Disposition Plan: Likely home once withdrawal issues have improved.   Consultants:   Psychiatry: Dr. Mariea Clonts 04/04/2017  Procedures:   None  Antimicrobials:  None   Subjective: Patient complained of pain diffusely.  Patient complaining that pain medication not controlling her pain adequately.  Denies any chest pain or shortness of breath.  No nausea or emesis.  Per RN patient with urinary retention.  Objective: Vitals:   04/05/17 1900 04/05/17 2057 04/05/17 2101 04/06/17 1157  BP: 122/68  (!) 112/96   Pulse: 92  82   Resp: 18  18   Temp: 99 F (37.2 C)  98.6 F (37 C)   TempSrc: Oral  Oral   SpO2: 91% 99% 98% 99%  Weight:      Height:         Intake/Output Summary (Last 24 hours) at 04/06/2017 1317 Last data filed at 04/06/2017 0943 Gross per 24 hour  Intake 120 ml  Output 1300 ml  Net -1180 ml   Filed Weights   04/03/17 1133 04/03/17 1728  Weight: 31.8 kg (70 lb) 32.4 kg (71 lb 8 oz)    Examination:  General exam: Thin.  Cachectic.  Frail. Respiratory system: Clear to auscultation. Respiratory effort normal. Cardiovascular system: S1 & S2 heard, RRR. No JVD, murmurs, rubs, gallops or clicks. No pedal edema. Gastrointestinal system: Abdomen is nondistended, soft and nontender. No organomegaly or masses felt. Normal bowel sounds heard. Central nervous system: Alert and oriented. No focal neurological deficits. Extremities: Symmetric 5 x 5 power. Skin: No rashes, lesions or ulcers Psychiatry: Judgement and insight appear normal. Mood & affect appropriate.     Data Reviewed: I have personally reviewed following labs and imaging studies  CBC: Recent Labs  Lab 04/03/17 1221 04/04/17 0545 04/05/17 0600 04/06/17 0616  WBC 16.8* 5.6 4.5 5.6  NEUTROABS 14.0*  --  2.9 3.7  HGB 11.6* 9.1* 9.6* 10.7*  HCT 33.8* 27.1* 28.7* 32.9*  MCV 89.4 90.6 92.6 94.0  PLT 214 161 162 096   Basic Metabolic Panel: Recent Labs  Lab 04/03/17 1221 04/04/17 0545 04/05/17 0600 04/06/17 0616  NA 128* 136 136 140  K 3.9 3.7 4.1 4.1  CL 96* 108 109 111  CO2 20* 23 23 22   GLUCOSE 96 83 102* 73  BUN 14 10 7 7   CREATININE 1.97* 1.27* 0.87 0.87  CALCIUM 9.2 8.2* 8.7* 9.3  MG  --   --  1.7 1.6*  PHOS  --   --  3.4 3.1   GFR: Estimated Creatinine Clearance: 33.4 mL/min (by C-G formula based on SCr of 0.87 mg/dL). Liver Function Tests: Recent Labs  Lab 04/03/17 1221 04/04/17 0545 04/05/17 0600 04/06/17 0616  AST 19 19 19 17   ALT 9* 9* 10* 10*  ALKPHOS 89 68 76 90  BILITOT 0.7 0.4 0.3 0.4  PROT 6.7 5.2* 5.5* 6.6  ALBUMIN 3.8 2.8* 3.0* 3.4*   Recent Labs  Lab 04/03/17 1221  LIPASE 24   No results for input(s):  AMMONIA in the last 168 hours. Coagulation Profile: No results for input(s): INR, PROTIME in the last 168 hours. Cardiac Enzymes: No results for input(s): CKTOTAL, CKMB, CKMBINDEX, TROPONINI in the last 168 hours. BNP (last 3 results) No results for input(s): PROBNP in the last 8760 hours. HbA1C: No results for input(s): HGBA1C in the last 72 hours. CBG: No results for input(s): GLUCAP in the last 168 hours. Lipid Profile: No results for input(s): CHOL, HDL, LDLCALC, TRIG, CHOLHDL, LDLDIRECT  in the last 72 hours. Thyroid Function Tests: No results for input(s): TSH, T4TOTAL, FREET4, T3FREE, THYROIDAB in the last 72 hours. Anemia Panel: No results for input(s): VITAMINB12, FOLATE, FERRITIN, TIBC, IRON, RETICCTPCT in the last 72 hours. Sepsis Labs: No results for input(s): PROCALCITON, LATICACIDVEN in the last 168 hours.  No results found for this or any previous visit (from the past 240 hour(s)).       Radiology Studies: No results found.      Scheduled Meds: . cloNIDine  0.1 mg Oral BID   Followed by  . [START ON 04/08/2017] cloNIDine  0.1 mg Oral Daily  . enoxaparin (LOVENOX) injection  20 mg Subcutaneous Q24H  . feeding supplement  1 Container Oral TID BM  . gabapentin  300 mg Oral TID  . lipase/protease/amylase  12,000 Units Oral TID WC  . mometasone-formoterol  2 puff Inhalation BID  . nicotine  21 mg Transdermal Daily  . pantoprazole  40 mg Oral Daily  . tiotropium  18 mcg Inhalation Daily   Continuous Infusions: . magnesium sulfate 1 - 4 g bolus IVPB 4 g (04/06/17 1133)     LOS: 2 days    Time spent: 35 minutes    Irine Seal, MD [facility] Pager [phone]  If 7PM-7AM, please contact night-coverage [email or web address] Password [email or web address] 04/06/2017, 1:17 PM

## 2017-04-07 LAB — BASIC METABOLIC PANEL
ANION GAP: 6 (ref 5–15)
BUN: 8 mg/dL (ref 6–20)
CO2: 22 mmol/L (ref 22–32)
Calcium: 8.4 mg/dL — ABNORMAL LOW (ref 8.9–10.3)
Chloride: 107 mmol/L (ref 101–111)
Creatinine, Ser: 0.89 mg/dL (ref 0.44–1.00)
GFR calc Af Amer: >60 mL/min (ref >60)
GLUCOSE: 135 mg/dL — AB (ref 65–99)
POTASSIUM: 3.7 mmol/L (ref 3.5–5.1)
Sodium: 135 mmol/L (ref 135–145)

## 2017-04-07 LAB — MAGNESIUM: Magnesium: 1.9 mg/dL (ref 1.7–2.4)

## 2017-04-07 MED ORDER — HYDROMORPHONE HCL 1 MG/ML IJ SOLN
0.5000 mg | Freq: Two times a day (BID) | INTRAMUSCULAR | Status: DC
Start: 2017-04-07 — End: 2017-04-11
  Administered 2017-04-07 – 2017-04-11 (×8): 0.5 mg via INTRAVENOUS
  Filled 2017-04-07 (×8): qty 0.5

## 2017-04-07 NOTE — Progress Notes (Signed)
LCSW following for SNF placement.  Patient originally declined SNF.   Patient and spouse now want SNF placement.  Patient has medicaid as a Pension scheme manager.   LCSW faxed patient out to facilities.   LCSW will continue to follow.   Carolin Coy [place]

## 2017-04-07 NOTE — Progress Notes (Signed)
Patient complaining of 10/10 pain in R leg and R hip and requesting something "stronger". MD paged and gave verbal order to give Oxycodone 5mg  now.

## 2017-04-07 NOTE — Progress Notes (Signed)
PROGRESS NOTE    [name]  WCH:[id number] DOB: Jun 14, 1950 DOA: 04/03/2017 PCP: Scot Jun, FNP   Brief Narrative:  HPI per Dr. Landis Gandy: [name] a 67 y.o.femalewith medical history significant ofchronic pancreatitis, chronic abdominal pain on p.o. Dilaudid and Ativan at home came in with complaints of nausea vomiting abdominal pain and not able to keep any food down. Patient lives at home with her husband. She is not a good historian. She told me that as soon as long as she gets her pain medicine and Ativan she will be fine.She asked me about her pain medication abdomen multiple times while I was in the room with her. The history is not very clear. I had a long conversation with her husband husband's phone number is [phone]. Patient reported that his wife was on Dilaudid and Ativan for over 20 years. She got Dilaudid 120 tablets/month and 90 tablets of Ativan. This was prescribed by Dr. Alyson Ingles. Now she has new pain management doctor by the name of Jeanella Anton who has prescribed her oxycodone 10 mg. The husband usually keeps in charge of all his medications or he tries to keep in charge of his other medications but today she got very upset with him and asked him to give her the whole bottle and she took the whole bottle with her. When the husband came back home she was acting crazy shaking mad on the floor of the kitchen he called EMS and brought her to the hospital. He thinks that she has taken 10 oxycodone tablets but he is not sure. According to him she is addicted to these pills for over 20 years. Even after taking all these pills she is still screams out in the night with pain apparently. She saw a psychiatrist Dr. Darleene Cleaver who recently prescribed her Elavil 25 mg. The husband reports that patient cannot tolerate all these medications at the same time. Patient has had 10 ED visits in the last 6 months. He said when she runs out of  her medications early she comes to the ER and she gets a dose of Dilaudid and Ativan and discharged back home this has been happening many times in the last 6 months.  ED Course:Patient received Ativan 2 mg. Her sodium was 128 potassium 3.9 BUN 14 creatinine 1.97. Her creatinine was has bumped up from 0.94 on March 08, 2017 to 1.97 today. White count 16.8 hemoglobin 11.6, platelet count 214. Her Tylenol level was less than 10 salicylate level was less than 7. Glucose was 96, UA was negative for UTI, urine drug screen was positive for benzodiazepines and opiates.  **Seen and was having tremors and complaining of Abdominal Pain. It appears she is withdrawing so restarted IV dilaudid and weaning. Psych consulted for further evaluation and recommendations given her Overdose of Oxycodone and Elavil.   She appeared improved today and will do Foley TOV today and continue to Wean IV Narcotics. PT evaluated and recommended SNF Placement so willl consult Social work for further assistance. Have started cutting back on IV Ativan and IV dilaudid and appreciated Psychiatry recommendatio     Assessment & Plan:   Principal Problem:   Benzodiazepine dependence (HCC) Active Problems:   Anxiety state   Nausea and vomiting   Benzodiazepine withdrawal (HCC)   Tobacco abuse   Abdominal pain   Hyponatremia   Underweight   COPD (chronic obstructive pulmonary disease) (HCC)   Narcotic addiction (HCC)   Abdominal pain, chronic, epigastric-  due to chronic pancreatitis   AKI (acute kidney injury) ([place])   Acute urinary retention   Pressure injury of skin   Pressure injury of skin of right ischial tuberosity region: Stage 2  #1 polysubstance abuse/polypharmacy/overdose concern for opiate and benzo withdrawal Patient noted to have been on Dilaudid and Ativan for greater than 20 years.  Patient noted on admission to be on oxycodone and amitriptyline as an outpatient.  Patient also noted clearly with  drug-seeking behavior.  Dr.Sheik called and spoke with patient's pain management provider Dr.Janelle Donovan Kail who states that because the patient overdosed and who didn't take her Oxycodone like she should would not accept her back as a patient and will refer her to Inpatient Treatment facility once she is discharged from the Hospital.  -Her psychiatrist is Dr. Darleene Cleaver and per husband she has been taking her Amitriptyline 25 mg 3-4x a day -She runs out of her pain medications way before she is supposed to and if she gets a 1 month supply she finishes off in 2 weeks.  Ativan IV 0.5 mg every 8 hours as needed and increase to every 6 hours as needed for anxiety and agitation however has subsequently been weaned to every 12 hours as needed.  Will change IV Ativan to oral Ativan twice daily as needed.  Continue hydroxyzine as needed.  Patient currently on oxycodone 5 mg every 6 hours as needed pain which we will continue.  Initially started OxyContin 10 mg twice daily, however due to recent OD will discontinue.  Change IV hydromorphone to every 12 hours as needed severe pain as there was some concern for benzo and opiate withdrawal.  Patient on clonidine taper.  Patient with no suicidal or homicidal ideations.  Alcohol level less than 10.  Salicylate level less than 7.  Patient had a one-to-one sitter which was subsequently discontinued after patient was seen in consultation by psychiatry.  Psychiatry also recommended discontinuation of Elavil. Case was discussed with Dr. Evette Doffing of [facility] who stated outpatient referral to [phone] when patient was discharged. -Continue to monitor for withdrawal.  Continue clonidine withdrawal protocol. -Psychiatry recommended not restarting Elavil.  Continue to wean IV Dilaudid.  2.  Urinary retention Patient noted to have urinary retention felt likely secondary to anticholinergic effects from tricyclics.  Elavil has been discontinued indefinitely.  Voiding trial was  tried on 04/05/2017, however patient with no significant urine output.  Foley catheter has been placed.  Patient with a urine output of 2 L over the past 24 hours.  Continue current dose of Flomax.  Will likely need a voiding trial in about 4-5 days. Will likely need outpatient follow-up with urology.  3.  Severe protein calorie malnutrition Continue current nutritional supplementation.    4.  Acute kidney injury Secondary to prerenal azotemia secondary to poor oral intake, nausea vomiting dehydration, chronic body pain.  Continue to hold nephrotoxins.  Renal function improving daily with hydration.  Saline lock IV fluids.   5.  Hypomagnesemia Repleted.  6.  Chronic pancreatitis with abdominal pain Lipase normal.  Patient currently stable and tolerating oral intake.  IV fluids have been discontinued.  Continue dicyclomine as needed for spasms and abdominal cramping.  Continue Creon with meals.  7.  COPD Currently stable.  Continue current regimen of Spiriva, Dulera, nebs as needed.    8.  Tobacco abuse Tobacco cessation.  Continue nicotine patch.  9.  Hyponatremia Secondary to hypovolemic hyponatremia.  Improving with hydration. Follow.  10.  Normocytic anemia Likely dilutional component.  Patient with no signs or symptoms of bleeding. Outpatient follow-up.  11.  Pressure injury stage II right ischial tuberosity Continue current wound care.    DVT prophylaxis: Lovenox Code Status: Full Family Communication: Updated patient.  No family at bedside. Disposition Plan: Likely skilled nursing facility once withdrawal issues have improved hopefully in the next 24-48 hours.   Consultants:   Psychiatry: Dr. Mariea Clonts 04/04/2017  Procedures:   None  Antimicrobials:  None   Subjective: Patient complaining of pain especially in the right lower extremity.  Patient asking for pain medicine to be given a little earlier.  No shortness of breath.  No chest pain.   Objective: Vitals:    04/06/17 2203 04/07/17 0520 04/07/17 0817 04/07/17 0827  BP:  113/70 108/62   Pulse:  68    Resp:  18    Temp:  97.7 F (36.5 C)    TempSrc:  Oral    SpO2: 95% 100%  99%  Weight:      Height:        Intake/Output Summary (Last 24 hours) at 04/07/2017 1342 Last data filed at 04/06/2017 1937 Gross per 24 hour  Intake -  Output 1100 ml  Net -1100 ml   Filed Weights   04/03/17 1133 04/03/17 1728  Weight: 31.8 kg (70 lb) 32.4 kg (71 lb 8 oz)    Examination:  General exam: Cachectic.  Frail.  Respiratory system: Lungs are clear to auscultation bilaterally with no wheezes, no crackles, no rhonchi.  Respiratory effort normal. Cardiovascular system: Regular rate rhythm no murmurs rubs or gallops.  No JVD.  No lower extremity edema.  Gastrointestinal system: Abdomen is soft, nontender, nondistended, positive bowel sounds.  No rebound.  No guarding.  Central nervous system: Alert and oriented. No focal neurological deficits. Extremities: Symmetric 5 x 5 power. Skin: No rashes, lesions or ulcers Psychiatry: Judgement and insight appear normal. Mood & affect appropriate.     Data Reviewed: I have personally reviewed following labs and imaging studies  CBC: Recent Labs  Lab 04/03/17 1221 04/04/17 0545 04/05/17 0600 04/06/17 0616  WBC 16.8* 5.6 4.5 5.6  NEUTROABS 14.0*  --  2.9 3.7  HGB 11.6* 9.1* 9.6* 10.7*  HCT 33.8* 27.1* 28.7* 32.9*  MCV 89.4 90.6 92.6 94.0  PLT 214 161 162 716   Basic Metabolic Panel: Recent Labs  Lab 04/03/17 1221 04/04/17 0545 04/05/17 0600 04/06/17 0616 04/07/17 0945  NA 128* 136 136 140 135  K 3.9 3.7 4.1 4.1 3.7  CL 96* 108 109 111 107  CO2 20* 23 23 22 22   GLUCOSE 96 83 102* 73 135*  BUN 14 10 7 7 8   CREATININE 1.97* 1.27* 0.87 0.87 0.89  CALCIUM 9.2 8.2* 8.7* 9.3 8.4*  MG  --   --  1.7 1.6* 1.9  PHOS  --   --  3.4 3.1  --    GFR: Estimated Creatinine Clearance: 32.7 mL/min (by C-G formula based on SCr of 0.89 mg/dL). Liver Function  Tests: Recent Labs  Lab 04/03/17 1221 04/04/17 0545 04/05/17 0600 04/06/17 0616  AST 19 19 19 17   ALT 9* 9* 10* 10*  ALKPHOS 89 68 76 90  BILITOT 0.7 0.4 0.3 0.4  PROT 6.7 5.2* 5.5* 6.6  ALBUMIN 3.8 2.8* 3.0* 3.4*   Recent Labs  Lab 04/03/17 1221  LIPASE 24   No results for input(s): AMMONIA in the last 168 hours. Coagulation Profile: No results for input(s): INR,  PROTIME in the last 168 hours. Cardiac Enzymes: No results for input(s): CKTOTAL, CKMB, CKMBINDEX, TROPONINI in the last 168 hours. BNP (last 3 results) No results for input(s): PROBNP in the last 8760 hours. HbA1C: No results for input(s): HGBA1C in the last 72 hours. CBG: No results for input(s): GLUCAP in the last 168 hours. Lipid Profile: No results for input(s): CHOL, HDL, LDLCALC, TRIG, CHOLHDL, LDLDIRECT in the last 72 hours. Thyroid Function Tests: No results for input(s): TSH, T4TOTAL, FREET4, T3FREE, THYROIDAB in the last 72 hours. Anemia Panel: No results for input(s): VITAMINB12, FOLATE, FERRITIN, TIBC, IRON, RETICCTPCT in the last 72 hours. Sepsis Labs: No results for input(s): PROCALCITON, LATICACIDVEN in the last 168 hours.  No results found for this or any previous visit (from the past 240 hour(s)).       Radiology Studies: No results found.      Scheduled Meds: . [START ON 04/08/2017] cloNIDine  0.1 mg Oral Daily  . enoxaparin (LOVENOX) injection  20 mg Subcutaneous Q24H  . feeding supplement  1 Container Oral TID BM  . gabapentin  300 mg Oral TID  .  HYDROmorphone (DILAUDID) injection  0.5 mg Intravenous Q12H  . lipase/protease/amylase  12,000 Units Oral TID WC  . mometasone-formoterol  2 puff Inhalation BID  . nicotine  21 mg Transdermal Daily  . pantoprazole  40 mg Oral Daily  . tamsulosin  0.4 mg Oral Daily  . tiotropium  18 mcg Inhalation Daily   Continuous Infusions:    LOS: 3 days    Time spent: 35 minutes    Irine Seal, MD [facility] Pager  [phone]  If 7PM-7AM, please contact night-coverage [email or web address] Password [email or web address] 04/07/2017, 1:42 PM

## 2017-04-07 NOTE — [place] FL2 (Signed)
[place] MEDICAID FL2 LEVEL OF CARE SCREENING TOOL     IDENTIFICATION  Patient Name: [name] Birthdate: 1950/05/21 Sex: female Admission Date (Current Location): 04/03/2017  [facility] and [place] Number:  Herbalist and Address:  [facility],  [address], [place]      Provider Number: [id number]  Attending Physician Name and Address:  Eugenie Filler, MD  Relative Name and Phone Number:       Current Level of Care: Hospital Recommended Level of Care: [place] Prior Approval Number:    Date Approved/Denied: 04/07/17 PASRR Number: [id number]  Discharge Plan: SNF    Current Diagnoses: Patient Active Problem List   Diagnosis Date Noted  . Pressure injury of skin 04/06/2017  . Pressure injury of skin of right ischial tuberosity region: Stage 2 04/06/2017  . Acute urinary retention 04/04/2017  . Benzodiazepine dependence ([place])   . AKI (acute kidney injury) ([place]) 04/03/2017  . Hip fracture, right ([place]) 06/02/2016  . Narcotic addiction ([place]) 02/29/2016  . Abdominal pain, chronic, epigastric- due to chronic pancreatitis 02/29/2016  . COPD (chronic obstructive pulmonary disease) ([place]) 02/28/2016  . Cough 09/03/2015  . Abdominal pain 01/03/2015  . Hyponatremia 01/03/2015  . Hypocalcemia 01/03/2015  . Underweight 01/03/2015  . COPD GOLD III with reversible component  07/17/2014  . DTs (delirium tremens) ([place]) 06/02/2013  . Chronic alcoholic pancreatitis ([place]) 06/02/2013  . Lactic acidosis 06/02/2013  . Recurrent acute pancreatitis 03/28/2013  . Alcohol withdrawal ([place]) 03/28/2013  . Pancreatitis 03/09/2013  . Tobacco abuse 03/09/2013  . Alcohol withdrawal syndrome with perceptual disturbance ([place]) 03/09/2013  . Acute alcoholic pancreatitis 00/93/8182  . Benzodiazepine withdrawal ([place]) 02/09/2013  . Protein-calorie malnutrition, severe ([place]) 02/09/2013  . Hypokalemia 02/27/2011  . Abdominal pain, acute  02/25/2011  . Nausea and vomiting 02/25/2011  . Thrombocytopenia ([place]) 02/25/2011  . COPD with acute exacerbation ([place]) 02/25/2011  . HTN (hypertension), malignant 02/25/2011  . KIDNEY CANCER 10/18/2008  . Anxiety state 10/18/2008  . GERD 10/18/2008  . CIRRHOSIS 10/18/2008  . PANCREATITIS, CHRONIC- atrophic pancreas 10/18/2008  . DYSPNEA 10/18/2008  . GASTRIC ULCER, HX OF 10/18/2008    Orientation RESPIRATION BLADDER Height & Weight     Self, Time, Situation, Place  O2 Continent, Indwelling catheter Weight: 71 lb 8 oz (32.4 kg) Height:  5\' 4"  (162.6 cm)  BEHAVIORAL SYMPTOMS/MOOD NEUROLOGICAL BOWEL NUTRITION STATUS      Continent Diet(See dc summ)  AMBULATORY STATUS COMMUNICATION OF NEEDS Skin   Extensive Assist Verbally PU Stage and Appropriate Care   PU Stage 2 Dressing: (Ishial Tuberosity, Right)                   Personal Care Assistance Level of Assistance  Bathing, Feeding, Dressing Bathing Assistance: Limited assistance Feeding assistance: Independent Dressing Assistance: Limited assistance     Functional Limitations Info  Sight, Hearing, Speech Sight Info: Impaired(wears glasses) Hearing Info: Adequate Speech Info: Adequate    SPECIAL CARE FACTORS FREQUENCY  PT (By licensed PT), OT (By licensed OT)     PT Frequency: 5x/week OT Frequency: 5x/week            Contractures Contractures Info: Not present    Additional Factors Info  Code Status, Allergies Code Status Info: Full Allergies Info: Aspirin, Chlorpromazine Hcl           Current Medications (04/07/2017):  This is the current hospital active medication list Current Facility-Administered Medications  Medication Dose Route Frequency Provider  Last Rate Last Dose  . [START ON 04/08/2017] cloNIDine (CATAPRES) tablet 0.1 mg  0.1 mg Oral Daily Fawze, Mina A, PA-C      . dicyclomine (BENTYL) tablet 20 mg  20 mg Oral Q6H PRN Fawze, Mina A, PA-C   20 mg at 04/04/17 0159  . enoxaparin (LOVENOX)  injection 20 mg  20 mg Subcutaneous Q24H Georgette Shell, MD   20 mg at 04/06/17 2246  . feeding supplement (BOOST / RESOURCE BREEZE) liquid 1 Container  1 Container Oral TID BM Raiford Noble [place], DO   1 Container at 04/06/17 2054  . gabapentin (NEURONTIN) capsule 300 mg  300 mg Oral TID Raiford Noble [place], DO   300 mg at 04/07/17 0818  . HYDROmorphone (DILAUDID) injection 0.5 mg  0.5 mg Intravenous Q12H Eugenie Filler, MD      . hydrOXYzine (ATARAX/VISTARIL) tablet 25 mg  25 mg Oral Q6H PRN Nils Flack, Mina A, PA-C   25 mg at 04/07/17 0531  . ipratropium-albuterol (DUONEB) 0.5-2.5 (3) MG/3ML nebulizer solution 3 mL  3 mL Nebulization Q6H PRN Georgette Shell, MD   3 mL at 04/06/17 2200  . lipase/protease/amylase (CREON) capsule 12,000 Units  12,000 Units Oral TID WC Georgette Shell, MD   12,000 Units at 04/05/17 1626  . loperamide (IMODIUM) capsule 2-4 mg  2-4 mg Oral PRN Fawze, Mina A, PA-C      . LORazepam (ATIVAN) tablet 0.5 mg  0.5 mg Oral BID PRN Eugenie Filler, MD   0.5 mg at 04/07/17 0818  . mometasone-formoterol (DULERA) 200-5 MCG/ACT inhaler 2 puff  2 puff Inhalation BID Georgette Shell, MD   2 puff at 04/07/17 [phone]  . nicotine (NICODERM CQ - dosed in mg/24 hours) patch 21 mg  21 mg Transdermal Daily Raiford Noble [place], DO   21 mg at 04/07/17 3244  . ondansetron (ZOFRAN) injection 4 mg  4 mg Intravenous Q8H PRN Raiford Noble [place], DO   4 mg at 04/07/17 1141  . ondansetron (ZOFRAN-ODT) disintegrating tablet 4 mg  4 mg Oral Q6H PRN Fawze, Mina A, PA-C   4 mg at 04/07/17 0531  . oxyCODONE (Oxy IR/ROXICODONE) immediate release tablet 5 mg  5 mg Oral Q6H PRN Georgette Shell, MD   5 mg at 04/07/17 0102  . pantoprazole (PROTONIX) EC tablet 40 mg  40 mg Oral Daily Georgette Shell, MD   40 mg at 04/07/17 0818  . tamsulosin (FLOMAX) capsule 0.4 mg  0.4 mg Oral Daily Eugenie Filler, MD   0.4 mg at 04/07/17 0818  . tiotropium (SPIRIVA) inhalation capsule 18 mcg   18 mcg Inhalation Daily Georgette Shell, MD   18 mcg at 04/07/17 7253     Discharge Medications: Please see discharge summary for a list of discharge medications.  Relevant Imaging Results:  Relevant Lab Results:   Additional Information [id number]  Servando Snare, LCSW

## 2017-04-07 NOTE — Progress Notes (Signed)
PT Cancellation Note  Patient Details Name: SOLIMAR MAIDEN MRN: [id number] DOB: 1950-02-15   Cancelled Treatment:     Pt in bed feuding with spouse about the chicken dinner he brought her.  "wrong kind".  Pt immediately stated "I can't walk" and declined any OOB activity.  Pt has been evaluated with rec for SNF.    Rica Koyanagi  PTA [facility]  Acute  Rehab Pager      [phone]

## 2017-04-07 NOTE — Progress Notes (Signed)
Per pt and husband the plan now is for pt to go to snf. CSW alerted. Marney Doctor RN,BSN,NCM [phone]

## 2017-04-07 NOTE — Progress Notes (Signed)
OT Cancellation Note  Patient Details Name: [name] MRN: [id number] DOB: November 09, 1950   Cancelled Treatment:    Reason Eval/Treat Not Completed: Pain limiting ability to participate  Noted plan now is for SNF. OT agrees with this DC plan.  Mickel Baas Filer, [place] 04/07/2017, 2:10 PM

## 2017-04-08 ENCOUNTER — Ambulatory Visit (INDEPENDENT_AMBULATORY_CARE_PROVIDER_SITE_OTHER): Payer: Medicaid Other | Admitting: Orthopaedic Surgery

## 2017-04-08 ENCOUNTER — Inpatient Hospital Stay (HOSPITAL_COMMUNITY): Payer: Medicaid Other

## 2017-04-08 DIAGNOSIS — R112 Nausea with vomiting, unspecified: Secondary | ICD-10-CM

## 2017-04-08 DIAGNOSIS — L89319 Pressure ulcer of right buttock, unspecified stage: Secondary | ICD-10-CM

## 2017-04-08 DIAGNOSIS — E871 Hypo-osmolality and hyponatremia: Secondary | ICD-10-CM

## 2017-04-08 DIAGNOSIS — Z720 Tobacco use: Secondary | ICD-10-CM

## 2017-04-08 MED ORDER — HYDROXYZINE HCL 25 MG PO TABS
25.0000 mg | ORAL_TABLET | Freq: Four times a day (QID) | ORAL | Status: DC | PRN
  Administered 2017-04-09 – 2017-04-11 (×6): 25 mg via ORAL
  Filled 2017-04-08 (×8): qty 1

## 2017-04-08 MED ORDER — OXYCODONE HCL 5 MG PO TABS
5.0000 mg | ORAL_TABLET | ORAL | Status: DC | PRN
  Administered 2017-04-08 – 2017-04-11 (×15): 5 mg via ORAL
  Filled 2017-04-08 (×15): qty 1

## 2017-04-08 MED ORDER — DICYCLOMINE HCL 20 MG PO TABS
20.0000 mg | ORAL_TABLET | Freq: Four times a day (QID) | ORAL | Status: DC | PRN
Start: 2017-04-08 — End: 2017-04-11
  Filled 2017-04-08: qty 1

## 2017-04-08 NOTE — Progress Notes (Signed)
Patient does not have bed.  [facility] is reviewing patient.   LCSW will continue to follow.   Carolin Coy [facility] [place]

## 2017-04-08 NOTE — Progress Notes (Signed)
PROGRESS NOTE    [name]  JQB:[id number] DOB: 19-Mar-1950 DOA: 04/03/2017 PCP: Scot Jun, FNP   Brief Narrative:  HPI per Dr. Landis Gandy: [name] a 67 y.o.femalewith medical history significant ofchronic pancreatitis, chronic abdominal pain on p.o. Dilaudid and Ativan at home came in with complaints of nausea vomiting abdominal pain and not able to keep any food down. Patient lives at home with her husband. She is not a good historian. She told me that as soon as long as she gets her pain medicine and Ativan she will be fine.She asked me about her pain medication abdomen multiple times while I was in the room with her. The history is not very clear. I had a long conversation with her husband husband's phone number is [phone]. Patient reported that his wife was on Dilaudid and Ativan for over 20 years. She got Dilaudid 120 tablets/month and 90 tablets of Ativan. This was prescribed by Dr. Alyson Ingles. Now she has new pain management doctor by the name of Jeanella Anton who has prescribed her oxycodone 10 mg. The husband usually keeps in charge of all his medications or he tries to keep in charge of his other medications but today she got very upset with him and asked him to give her the whole bottle and she took the whole bottle with her. When the husband came back home she was acting crazy shaking mad on the floor of the kitchen he called EMS and brought her to the hospital. He thinks that she has taken 10 oxycodone tablets but he is not sure. According to him she is addicted to these pills for over 20 years. Even after taking all these pills she is still screams out in the night with pain apparently. She saw a psychiatrist Dr. Darleene Cleaver who recently prescribed her Elavil 25 mg. The husband reports that patient cannot tolerate all these medications at the same time. Patient has had 10 ED visits in the last 6 months. He said when she runs out of  her medications early she comes to the ER and she gets a dose of Dilaudid and Ativan and discharged back home this has been happening many times in the last 6 months.  ED Course:Patient received Ativan 2 mg. Her sodium was 128 potassium 3.9 BUN 14 creatinine 1.97. Her creatinine was has bumped up from 0.94 on March 08, 2017 to 1.97 today. White count 16.8 hemoglobin 11.6, platelet count 214. Her Tylenol level was less than 10 salicylate level was less than 7. Glucose was 96, UA was negative for UTI, urine drug screen was positive for benzodiazepines and opiates.  **Seen and was having tremors and complaining of Abdominal Pain. It appears she is withdrawing so restarted IV dilaudid and weaning. Psych consulted for further evaluation and recommendations given her Overdose of Oxycodone and Elavil.   She appeared improved today and will do Foley TOV today and continue to Wean IV Narcotics. PT evaluated and recommended SNF Placement so willl consult Social work for further assistance. Have started cutting back on IV Ativan and IV dilaudid and appreciated Psychiatry recommendatio     Assessment & Plan:   Principal Problem:   Benzodiazepine dependence (HCC) Active Problems:   Anxiety state   Nausea and vomiting   Benzodiazepine withdrawal (HCC)   Tobacco abuse   Abdominal pain   Hyponatremia   Underweight   COPD (chronic obstructive pulmonary disease) (HCC)   Narcotic addiction (HCC)   Abdominal pain, chronic, epigastric-  due to chronic pancreatitis   AKI (acute kidney injury) ([place])   Acute urinary retention   Pressure injury of skin   Pressure injury of skin of right ischial tuberosity region: Stage 2  #1 polysubstance abuse/polypharmacy/overdose concern for opiate and benzo withdrawal Patient noted to have been on Dilaudid and Ativan for greater than 20 years.  Patient noted on admission to be on oxycodone and amitriptyline as an outpatient.  Patient also noted clearly with  drug-seeking behavior.  Dr.Sheik called and spoke with patient's pain management provider Dr.Janelle Donovan Kail who states that because the patient overdosed and who didn't take her Oxycodone like she should would not accept her back as a patient and will refer her to Inpatient Treatment facility once she is discharged from the Hospital.  -Her psychiatrist is Dr. Darleene Cleaver and per husband she has been taking her Amitriptyline 25 mg 3-4x a day -She runs out of her pain medications way before she is supposed to and if she gets a 1 month supply she finishes off in 2 weeks.  Ativan IV 0.5 mg every 8 hours as needed and increase to every 6 hours as needed for anxiety and agitation however has subsequently been weaned to every 12 hours as needed.  IV Ativan has been transitioned to oral Ativan.  Continue hydroxyzine as needed.  Patient currently on oxycodone 5 mg every 6 hours as needed pain.  Change oxycodone to 5 mg every 4 hours as needed.  Changed IV hydromorphone to every 12 hours as needed severe pain as there was some concern for benzo and opiate withdrawal.  Patient on clonidine taper.  Patient with no suicidal or homicidal ideations.  Alcohol level less than 10.  Salicylate level less than 7.  Patient had a one-to-one sitter which was subsequently discontinued after patient was seen in consultation by psychiatry.  Psychiatry also recommended discontinuation of Elavil. Case was discussed with Dr. Evette Doffing of Suboxone clinic who stated outpatient referral to [phone] when patient was discharged. -Continue to monitor for withdrawal.  Continue clonidine withdrawal protocol.  Continue to taper IV Dilaudid. -Psychiatry recommended not restarting Elavil.   2.  Urinary retention Patient noted to have urinary retention felt likely secondary to anticholinergic effects from tricyclics.  Elavil has been discontinued indefinitely.  Voiding trial was tried on 04/05/2017, however patient with no significant urine output.   Foley catheter has been placed.  Patient with a urine output of 1.025 L over the past 24 hours.  Continue Flomax.  Will need voiding trial in about 3-4 days.  May need to follow-up with urology in the outpatient setting.   3.  Severe protein calorie malnutrition Continue current nutritional supplementation.    4.  Acute kidney injury Secondary to prerenal azotemia secondary to poor oral intake, nausea vomiting dehydration, chronic body pain.  Avoid nephrotoxins.  Renal function improved.  IV fluids have been saline locked.   5.  Hypomagnesemia Repleted.  6.  Chronic pancreatitis with abdominal pain Lipase normal.  Patient currently stable and tolerating oral intake.  Continue dicyclomine as needed for spasms and abdominal cramping.  IV fluids have been saline locked.  Continue Creon with meals.    7.  COPD Currently stable.  Continue current regimen of Spiriva, Dulera, nebs as needed.    8.  Tobacco abuse Tobacco cessation.  Continue nicotine patch.  9.  Hyponatremia Secondary to hypovolemic hyponatremia.  Improved with IV fluids.  Patient has been refusing labs. Follow.  10.  Normocytic anemia Likely  dilutional component.  Patient with no signs or symptoms of bleeding. Outpatient follow-up.  11.  Pressure injury stage II right ischial tuberosity Continue current wound care.  12.  Right hip and lower extremity pain Could be secondary to chronic pain.  Will check plain films of the right hip and lower extremity to rule out fracture.  Continue current pain management.    DVT prophylaxis: Lovenox Code Status: Full Family Communication: Updated patient.  No family at bedside. Disposition Plan: Likely skilled nursing facility once withdrawal issues have improved hopefully in the next 24-48 hours.   Consultants:   Psychiatry: Dr. Mariea Clonts 04/04/2017  Procedures:   Plain films of the right hip and pelvis pending 04/08/2017  Antimicrobials:  None   Subjective: Patient  complaining of right hip and right lower extremity pain.  No chest pain.  No shortness of breath.    Objective: Vitals:   04/07/17 2121 04/07/17 2122 04/08/17 0446 04/08/17 0748  BP: 122/78  136/77   Pulse: 69  72   Resp: 20  20   Temp: 97.9 F (36.6 C)  98.3 F (36.8 C)   TempSrc: Oral  Oral   SpO2: 100% 98% 100% 97%  Weight:      Height:        Intake/Output Summary (Last 24 hours) at 04/08/2017 1507 Last data filed at 04/08/2017 0918 Gross per 24 hour  Intake 340 ml  Output 1025 ml  Net -685 ml   Filed Weights   04/03/17 1133 04/03/17 1728  Weight: 31.8 kg (70 lb) 32.4 kg (71 lb 8 oz)    Examination:  General exam: Cachectic.  Frail.  Respiratory system: Lungs are clear to auscultation bilaterally with minimal wheezes, no crackles, no rhonchi.  Respiratory effort normal. Cardiovascular system: RRR no m/r/g . No JVD.  No lower extremity edema.  Gastrointestinal system: Abdomen is nontender, soft, nondistended, positive bowel sounds.  No guarding.  No rebound.   Central nervous system: Alert and oriented. No focal neurological deficits. Extremities: Symmetric 5 x 5 power. Skin: No rashes, lesions or ulcers Psychiatry: Judgement and insight appear normal. Mood & affect appropriate.     Data Reviewed: I have personally reviewed following labs and imaging studies  CBC: Recent Labs  Lab 04/03/17 1221 04/04/17 0545 04/05/17 0600 04/06/17 0616  WBC 16.8* 5.6 4.5 5.6  NEUTROABS 14.0*  --  2.9 3.7  HGB 11.6* 9.1* 9.6* 10.7*  HCT 33.8* 27.1* 28.7* 32.9*  MCV 89.4 90.6 92.6 94.0  PLT 214 161 162 333   Basic Metabolic Panel: Recent Labs  Lab 04/03/17 1221 04/04/17 0545 04/05/17 0600 04/06/17 0616 04/07/17 0945  NA 128* 136 136 140 135  K 3.9 3.7 4.1 4.1 3.7  CL 96* 108 109 111 107  CO2 20* 23 23 22 22   GLUCOSE 96 83 102* 73 135*  BUN 14 10 7 7 8   CREATININE 1.97* 1.27* 0.87 0.87 0.89  CALCIUM 9.2 8.2* 8.7* 9.3 8.4*  MG  --   --  1.7 1.6* 1.9  PHOS  --   --   3.4 3.1  --    GFR: Estimated Creatinine Clearance: 32.7 mL/min (by C-G formula based on SCr of 0.89 mg/dL). Liver Function Tests: Recent Labs  Lab 04/03/17 1221 04/04/17 0545 04/05/17 0600 04/06/17 0616  AST 19 19 19 17   ALT 9* 9* 10* 10*  ALKPHOS 89 68 76 90  BILITOT 0.7 0.4 0.3 0.4  PROT 6.7 5.2* 5.5* 6.6  ALBUMIN 3.8 2.8* 3.0*  3.4*   Recent Labs  Lab 04/03/17 1221  LIPASE 24   No results for input(s): AMMONIA in the last 168 hours. Coagulation Profile: No results for input(s): INR, PROTIME in the last 168 hours. Cardiac Enzymes: No results for input(s): CKTOTAL, CKMB, CKMBINDEX, TROPONINI in the last 168 hours. BNP (last 3 results) No results for input(s): PROBNP in the last 8760 hours. HbA1C: No results for input(s): HGBA1C in the last 72 hours. CBG: No results for input(s): GLUCAP in the last 168 hours. Lipid Profile: No results for input(s): CHOL, HDL, LDLCALC, TRIG, CHOLHDL, LDLDIRECT in the last 72 hours. Thyroid Function Tests: No results for input(s): TSH, T4TOTAL, FREET4, T3FREE, THYROIDAB in the last 72 hours. Anemia Panel: No results for input(s): VITAMINB12, FOLATE, FERRITIN, TIBC, IRON, RETICCTPCT in the last 72 hours. Sepsis Labs: No results for input(s): PROCALCITON, LATICACIDVEN in the last 168 hours.  No results found for this or any previous visit (from the past 240 hour(s)).       Radiology Studies: No results found.      Scheduled Meds: . cloNIDine  0.1 mg Oral Daily  . enoxaparin (LOVENOX) injection  20 mg Subcutaneous Q24H  . feeding supplement  1 Container Oral TID BM  . gabapentin  300 mg Oral TID  .  HYDROmorphone (DILAUDID) injection  0.5 mg Intravenous Q12H  . lipase/protease/amylase  12,000 Units Oral TID WC  . mometasone-formoterol  2 puff Inhalation BID  . nicotine  21 mg Transdermal Daily  . pantoprazole  40 mg Oral Daily  . tamsulosin  0.4 mg Oral Daily  . tiotropium  18 mcg Inhalation Daily   Continuous  Infusions:    LOS: 4 days    Time spent: 35 minutes    Irine Seal, MD [facility] Pager [phone]  If 7PM-7AM, please contact night-coverage [email or web address] Password [email or web address] 04/08/2017, 3:07 PM

## 2017-04-09 DIAGNOSIS — R1013 Epigastric pain: Secondary | ICD-10-CM

## 2017-04-09 DIAGNOSIS — G8929 Other chronic pain: Secondary | ICD-10-CM

## 2017-04-09 DIAGNOSIS — J449 Chronic obstructive pulmonary disease, unspecified: Secondary | ICD-10-CM

## 2017-04-09 LAB — BASIC METABOLIC PANEL
Anion gap: 10 (ref 5–15)
BUN: 10 mg/dL (ref 6–20)
CALCIUM: 8.9 mg/dL (ref 8.9–10.3)
CO2: 19 mmol/L — AB (ref 22–32)
CREATININE: 0.8 mg/dL (ref 0.44–1.00)
Chloride: 108 mmol/L (ref 101–111)
Glucose, Bld: 153 mg/dL — ABNORMAL HIGH (ref 65–99)
Potassium: 4.5 mmol/L (ref 3.5–5.1)
SODIUM: 137 mmol/L (ref 135–145)

## 2017-04-09 MED ORDER — POLYETHYLENE GLYCOL 3350 17 G PO PACK
17.0000 g | PACK | Freq: Every day | ORAL | Status: DC
  Administered 2017-04-10 – 2017-04-11 (×2): 17 g via ORAL
  Filled 2017-04-09 (×2): qty 1

## 2017-04-09 MED ORDER — SENNA 8.6 MG PO TABS
1.0000 | ORAL_TABLET | Freq: Every day | ORAL | Status: DC
  Administered 2017-04-09 – 2017-04-10 (×2): 8.6 mg via ORAL
  Filled 2017-04-09 (×2): qty 1

## 2017-04-09 NOTE — Progress Notes (Signed)
Clinical Social Worker following patient for discharge needs. CSW reached out to admission coordinator at [facility]) and she stated they are able to offer a bed for patient. [facility] [place] stated she will not be able to take patient until Monday. Clinical Social Worker on the floor will continue to follow patient to assist with disposition.   Rhea Pink, MSW,  [place]

## 2017-04-09 NOTE — Progress Notes (Signed)
[facility] PROGRESS NOTE  [name] VOH:[id number] DOB: 07-05-1950 DOA: 04/03/2017  PCP: Scot Jun, FNP  Brief History/Interval Summary: [name] a 67 y.o.femalewith medical history significant ofchronic pancreatitis, chronic abdominal pain on p.o. Dilaudid and Ativan at home came in with complaints of nausea vomiting abdominal pain and not able to keep any food down.  Patient has been reported that his wife was on Dilaudid and Ativan for over 20 years. She got Dilaudid 120 tablets/month and 90 tablets of Ativan. This was prescribed by Dr. Alyson Ingles.  She was seen by a new pain management specialist who switched her over to oxycodone.  Patient also started having worsening confusion.  There was concern for drug overdose but also for withdrawal effects.  She was also prescribed Elavil recently.  She developed urinary retention during this hospitalization.  She was seen by psychiatry.   Consultants: Psychiatry  Procedures: None  Antibiotics: None  Subjective/Interval History: Patient complains of pain in her right hip area which is chronic.  Denies any other complaints.  ROS: Denies any nausea or vomiting  Objective:  Vital Signs  Vitals:   04/09/17 0554 04/09/17 0700 04/09/17 0831 04/09/17 0938  BP: 139/84 (!) 155/82  134/73  Pulse: 71 67    Resp: 20     Temp: 98.1 F (36.7 C) 98.1 F (36.7 C)    TempSrc: Oral Oral    SpO2: 100% 100% 96%   Weight:      Height:        Intake/Output Summary (Last 24 hours) at 04/09/2017 1250 Last data filed at 04/09/2017 0800 Gross per 24 hour  Intake 360 ml  Output 925 ml  Net -565 ml   Filed Weights   04/03/17 1133 04/03/17 1728  Weight: 31.8 kg (70 lb) 32.4 kg (71 lb 8 oz)    General appearance: alert, cooperative, appears stated age and no distress Head: Normocephalic, without obvious abnormality, atraumatic Resp: clear to auscultation bilaterally Cardio: regular rate and rhythm, S1, S2  normal, no murmur, click, rub or gallop GI: soft, non-tender; bowel sounds normal; no masses,  no organomegaly Extremities: extremities normal, atraumatic, no cyanosis or edema Neurologic: Cranial nerves II to XII intact.  Motor strength equal bilateral upper and lower extremities.  Somewhat limited in the right leg due to hip issues.  Lab Results:  Data Reviewed: I have personally reviewed following labs and imaging studies  CBC: Recent Labs  Lab 04/03/17 1221 04/04/17 0545 04/05/17 0600 04/06/17 0616  WBC 16.8* 5.6 4.5 5.6  NEUTROABS 14.0*  --  2.9 3.7  HGB 11.6* 9.1* 9.6* 10.7*  HCT 33.8* 27.1* 28.7* 32.9*  MCV 89.4 90.6 92.6 94.0  PLT 214 161 162 694    Basic Metabolic Panel: Recent Labs  Lab 04/04/17 0545 04/05/17 0600 04/06/17 0616 04/07/17 0945 04/09/17 1032  NA 136 136 140 135 137  K 3.7 4.1 4.1 3.7 4.5  CL 108 109 111 107 108  CO2 23 23 22 22  19*  GLUCOSE 83 102* 73 135* 153*  BUN 10 7 7 8 10   CREATININE 1.27* 0.87 0.87 0.89 0.80  CALCIUM 8.2* 8.7* 9.3 8.4* 8.9  MG  --  1.7 1.6* 1.9  --   PHOS  --  3.4 3.1  --   --     GFR: Estimated Creatinine Clearance: 36.3 mL/min (by C-G formula based on SCr of 0.8 mg/dL).  Liver Function Tests: Recent Labs  Lab 04/03/17 1221 04/04/17 0545 04/05/17 0600 04/06/17  0616  AST 19 19 19 17   ALT 9* 9* 10* 10*  ALKPHOS 89 68 76 90  BILITOT 0.7 0.4 0.3 0.4  PROT 6.7 5.2* 5.5* 6.6  ALBUMIN 3.8 2.8* 3.0* 3.4*    Recent Labs  Lab 04/03/17 1221  LIPASE 24    Radiology Studies: Dg Hip Unilat With Pelvis 2-3 Views Right  Result Date: 04/08/2017 CLINICAL DATA:  RIGHT hip pain, no injury. EXAM: DG HIP (WITH OR WITHOUT PELVIS) 2-3V RIGHT COMPARISON:  RIGHT hip radiographs November 14, 2016 FINDINGS: There is no evidence of hip fracture or dislocation. There is no evidence of arthropathy or other focal bone abnormality. Osteopenia. Status post RIGHT femoral neck pinning with intramedullary rod. Stable heterotopic  ossification RIGHT greater trochanter and to lesser extent lesser trochanter. Imaged hardware is intact without periprosthetic lucency. LEFT femur dynamic screw with lateral plate and screw fixation, incompletely imaged. Phleboliths project in the pelvis. Aortoiliac calcifications. IMPRESSION: 1. No acute fracture deformity or dislocation. Status post bilateral femur ORIF. Electronically Signed   By: Elon Alas M.D.   On: 04/08/2017 16:52     Medications:  Scheduled: . enoxaparin (LOVENOX) injection  20 mg Subcutaneous Q24H  . feeding supplement  1 Container Oral TID BM  . gabapentin  300 mg Oral TID  .  HYDROmorphone (DILAUDID) injection  0.5 mg Intravenous Q12H  . lipase/protease/amylase  12,000 Units Oral TID WC  . mometasone-formoterol  2 puff Inhalation BID  . nicotine  21 mg Transdermal Daily  . pantoprazole  40 mg Oral Daily  . polyethylene glycol  17 g Oral Daily  . senna  1 tablet Oral QHS  . tamsulosin  0.4 mg Oral Daily  . tiotropium  18 mcg Inhalation Daily   Continuous:  ZMO:[id number], hydrOXYzine, ipratropium-albuterol, LORazepam, ondansetron (ZOFRAN) IV, oxyCODONE  Assessment/Plan:  Principal Problem:   Benzodiazepine dependence (HCC) Active Problems:   Anxiety state   Nausea and vomiting   Benzodiazepine withdrawal (HCC)   Tobacco abuse   Abdominal pain   Hyponatremia   Underweight   COPD (chronic obstructive pulmonary disease) (HCC)   Narcotic addiction (HCC)   Abdominal pain, chronic, epigastric- due to chronic pancreatitis   AKI (acute kidney injury) ([place])   Acute urinary retention   Pressure injury of skin   Pressure injury of skin of right ischial tuberosity region: Stage 2    Polysubstance abuse/polypharmacy/opioid and benzo withdrawal Patient has been on Dilaudid and Ativan for greater than 20 years.  Recently medication changes were made by her pain management specialist.  Followed by Dr. Jeanella Anton.  Apparently this provider will  not like to see the patient back as patient did not take the oxycodone as instructed.  Patient recently started on Elavil as well.  Seen by psychiatry during this hospitalization.  Elavil was discontinued.  Patient was started on clonidine taper for withdrawal symptoms.  She is also being slowly weaned down on the IV hydromorphone.  She currently remains on oxycodone orally as needed.  She is also on low-dose Ativan.  She seems to be hemodynamically stable.  No suicidal or homicidal ideation.  Case was also discussed with Dr. Evette Doffing at the [facility] who recommended outpatient referral when patient is discharged.  They are office number is [id number].  Patient is medically stable currently.  No signs or symptoms of withdrawal.  Urinary retention Thought to be secondary to anticholinergic effects from tricyclics.  Elavil has been discontinued.  Voiding trial was attempted on 4/2 however patient  failed.  Foley catheter has to be reinserted.  She has not really mobilized a whole lot.  Physical therapy is recommending skilled nursing facility.  Social worker is pursuing.  We would like for her to be more active before giving another voiding trial.  This can be done at the skilled nursing facility.  Continue Flomax.  Severe protein calorie malnutrition Continue nutritional supplements.  Acute kidney injury Secondary to prerenal azotemia due to poor oral intake.  Renal function has improved.  IV fluids discontinued.  Hypomagnesemia Repleted  History of chronic pancreatitis with chronic abdominal pain Lipase was normal.  Tolerating oral diet.  Continue dicyclomine as needed for spasms and cramps.  Creon.  History of COPD Stable.  Continue home medications.  History of tobacco abuse Counseled.  Continue nicotine patch.  Hyponatremia Due to poor oral intake.  Improved.  Normocytic anemia Hemoglobin stable.  No evidence of overt bleeding.  Likely dilutional.  Pressure injury stage II right  ischial tuberosity Continue current wound care.  Right hip and lower extremity pain X-ray of the hip did not show any acute fractures.  This is likely chronic.  She was able to lift the legs off the bed today.  Continue physical and occupational therapy.    DVT Prophylaxis: Lovenox    Code Status: Full code Family Communication: Discussed with the patient Disposition Plan: Medically stable.  Await placement to skilled nursing facility.    LOS: 5 days   [facility] Pager [phone] 04/09/2017, 12:50 PM  If 7PM-7AM, please contact night-coverage at [email or web address], password [facility]

## 2017-04-10 DIAGNOSIS — R338 Other retention of urine: Secondary | ICD-10-CM

## 2017-04-10 NOTE — Progress Notes (Signed)
[facility] PROGRESS NOTE  [name] TIW:[id number] DOB: 12-Aug-1950 DOA: 04/03/2017  PCP: Scot Jun, FNP  Brief History/Interval Summary: [name] a 67 y.o.femalewith medical history significant ofchronic pancreatitis, chronic abdominal pain on p.o. Dilaudid and Ativan at home came in with complaints of nausea vomiting abdominal pain and not able to keep any food down.  Patient has been reported that his wife was on Dilaudid and Ativan for over 20 years. She got Dilaudid 120 tablets/month and 90 tablets of Ativan. This was prescribed by Dr. Alyson Ingles.  She was seen by a new pain management specialist who switched her over to oxycodone.  Patient also started having worsening confusion.  There was concern for drug overdose but also for withdrawal effects.  She was also prescribed Elavil recently.  She developed urinary retention during this hospitalization.  She was seen by psychiatry.   Consultants: Psychiatry  Procedures: None  Antibiotics: None  Subjective/Interval History: Patient complaining of lot of irritation around the Foley catheter.  Requesting that it be taken out.  Otherwise denies any other complaints.  ROS: Denies any nausea vomiting  Objective:  Vital Signs  Vitals:   04/09/17 2100 04/09/17 2111 04/10/17 0450 04/10/17 0842  BP: (!) 144/95  128/72   Pulse: 77  78   Resp: 18  18   Temp: 98.1 F (36.7 C)  98 F (36.7 C)   TempSrc: Oral  Oral   SpO2: 100% 100% 100% 99%  Weight:      Height:        Intake/Output Summary (Last 24 hours) at 04/10/2017 0929 Last data filed at 04/10/2017 0921 Gross per 24 hour  Intake 660 ml  Output 500 ml  Net 160 ml   Filed Weights   04/03/17 1133 04/03/17 1728  Weight: 31.8 kg (70 lb) 32.4 kg (71 lb 8 oz)    General appearance: Awake alert.  In no distress. Resp: Clear to auscultation bilaterally.  No wheezing rales or rhonchi Cardio: S1-S2 is normal regular.  No S3-S4.  No rubs murmurs  or bruit GI: Abdomen is soft.  Nontender nondistended.  Bowel sounds are present.  No masses organomegaly Extremities: No edema Neurologic: Cranial nerves II to XII intact.  Motor strength equal bilateral upper and lower extremities.  Somewhat limited in the right leg due to hip issues.  Lab Results:  Data Reviewed: I have personally reviewed following labs and imaging studies  CBC: Recent Labs  Lab 04/03/17 1221 04/04/17 0545 04/05/17 0600 04/06/17 0616  WBC 16.8* 5.6 4.5 5.6  NEUTROABS 14.0*  --  2.9 3.7  HGB 11.6* 9.1* 9.6* 10.7*  HCT 33.8* 27.1* 28.7* 32.9*  MCV 89.4 90.6 92.6 94.0  PLT 214 161 162 250    Basic Metabolic Panel: Recent Labs  Lab 04/04/17 0545 04/05/17 0600 04/06/17 0616 04/07/17 0945 04/09/17 1032  NA 136 136 140 135 137  K 3.7 4.1 4.1 3.7 4.5  CL 108 109 111 107 108  CO2 23 23 22 22  19*  GLUCOSE 83 102* 73 135* 153*  BUN 10 7 7 8 10   CREATININE 1.27* 0.87 0.87 0.89 0.80  CALCIUM 8.2* 8.7* 9.3 8.4* 8.9  MG  --  1.7 1.6* 1.9  --   PHOS  --  3.4 3.1  --   --     GFR: Estimated Creatinine Clearance: 36.3 mL/min (by C-G formula based on SCr of 0.8 mg/dL).  Liver Function Tests: Recent Labs  Lab 04/03/17 1221 04/04/17 0545  04/05/17 0600 04/06/17 0616  AST 19 19 19 17   ALT 9* 9* 10* 10*  ALKPHOS 89 68 76 90  BILITOT 0.7 0.4 0.3 0.4  PROT 6.7 5.2* 5.5* 6.6  ALBUMIN 3.8 2.8* 3.0* 3.4*    Recent Labs  Lab 04/03/17 1221  LIPASE 24    Radiology Studies: Dg Hip Unilat With Pelvis 2-3 Views Right  Result Date: 04/08/2017 CLINICAL DATA:  RIGHT hip pain, no injury. EXAM: DG HIP (WITH OR WITHOUT PELVIS) 2-3V RIGHT COMPARISON:  RIGHT hip radiographs November 14, 2016 FINDINGS: There is no evidence of hip fracture or dislocation. There is no evidence of arthropathy or other focal bone abnormality. Osteopenia. Status post RIGHT femoral neck pinning with intramedullary rod. Stable heterotopic ossification RIGHT greater trochanter and to lesser  extent lesser trochanter. Imaged hardware is intact without periprosthetic lucency. LEFT femur dynamic screw with lateral plate and screw fixation, incompletely imaged. Phleboliths project in the pelvis. Aortoiliac calcifications. IMPRESSION: 1. No acute fracture deformity or dislocation. Status post bilateral femur ORIF. Electronically Signed   By: Elon Alas M.D.   On: 04/08/2017 16:52     Medications:  Scheduled: . enoxaparin (LOVENOX) injection  20 mg Subcutaneous Q24H  . feeding supplement  1 Container Oral TID BM  . gabapentin  300 mg Oral TID  .  HYDROmorphone (DILAUDID) injection  0.5 mg Intravenous Q12H  . lipase/protease/amylase  12,000 Units Oral TID WC  . mometasone-formoterol  2 puff Inhalation BID  . nicotine  21 mg Transdermal Daily  . pantoprazole  40 mg Oral Daily  . polyethylene glycol  17 g Oral Daily  . senna  1 tablet Oral QHS  . tamsulosin  0.4 mg Oral Daily  . tiotropium  18 mcg Inhalation Daily   Continuous:  DZH:[id number], hydrOXYzine, ipratropium-albuterol, LORazepam, ondansetron (ZOFRAN) IV, oxyCODONE  Assessment/Plan:  Principal Problem:   Benzodiazepine dependence (HCC) Active Problems:   Anxiety state   Nausea and vomiting   Benzodiazepine withdrawal (HCC)   Tobacco abuse   Abdominal pain   Hyponatremia   Underweight   COPD (chronic obstructive pulmonary disease) (HCC)   Narcotic addiction (HCC)   Abdominal pain, chronic, epigastric- due to chronic pancreatitis   AKI (acute kidney injury) ([place])   Acute urinary retention   Pressure injury of skin   Pressure injury of skin of right ischial tuberosity region: Stage 2    Polysubstance abuse/polypharmacy/opioid and benzo withdrawal Patient has been on Dilaudid and Ativan for greater than 20 years.  Recently medication changes were made by her pain management specialist.  Followed by Dr. Jeanella Anton.  Apparently this provider will not like to see the patient back as patient did not  take the oxycodone as instructed.  Patient recently started on Elavil as well.  Seen by psychiatry during this hospitalization.  Elavil was discontinued.  Patient was started on clonidine taper for withdrawal symptoms.  She is also being slowly weaned down on the IV hydromorphone.  She currently remains on oxycodone orally as needed.  Continue to slowly wean down.  She is also on low-dose Ativan.  She seems to be hemodynamically stable.  No suicidal or homicidal ideation.  Case was also discussed with Dr. Evette Doffing at the Suboxone clinic who recommended outpatient referral when patient is discharged.  Their office number is [phone].  Patient is medically stable currently.  No signs or symptoms of withdrawal. Physical therapy is recommending skilled nursing facility.  Social worker is following.  Urinary retention Thought to be  secondary to anticholinergic effects from tricyclics.  Elavil has been discontinued.  Voiding trial was attempted on 4/2 however patient failed.  Foley catheter has to be reinserted.  Patient complaining of lot of irritation around the Foley catheter site.  She is requesting that the catheter be removed.  We will discontinue today and hopefully she will void on her own.  Continue Flomax.  Severe protein calorie malnutrition Continue nutritional supplements.  Acute kidney injury Secondary to prerenal azotemia due to poor oral intake.  Renal function has improved.  IV fluids discontinued.  Hypomagnesemia Repleted  History of chronic pancreatitis with chronic abdominal pain Lipase was normal.  Tolerating oral diet.  Continue dicyclomine as needed for spasms and cramps.  Creon.  History of COPD Stable.  Continue home medications.  History of tobacco abuse Counseled.  Continue nicotine patch.  Hyponatremia Due to poor oral intake.  Improved.  Normocytic anemia Hemoglobin stable.  No evidence of overt bleeding.  Likely dilutional.  Pressure injury stage II right ischial  tuberosity Continue current wound care.  Right hip and lower extremity pain X-ray of the hip did not show any acute fractures.  This is likely chronic.  She was able to lift the legs off the bed today.  Continue physical and occupational therapy.    DVT Prophylaxis: Lovenox    Code Status: Full code Family Communication: Discussed with the patient Disposition Plan: Anticipate discharge to skilled nursing facility tomorrow.    LOS: 6 days   [facility] Pager [phone] 04/10/2017, 9:29 AM  If 7PM-7AM, please contact night-coverage at [email or web address], password [facility]

## 2017-04-11 ENCOUNTER — Ambulatory Visit: Payer: Medicaid Other | Admitting: Cardiology

## 2017-04-11 ENCOUNTER — Ambulatory Visit (INDEPENDENT_AMBULATORY_CARE_PROVIDER_SITE_OTHER): Payer: Medicaid Other | Admitting: Orthopaedic Surgery

## 2017-04-11 MED ORDER — DICYCLOMINE HCL 20 MG PO TABS: 20.0000 mg | ORAL_TABLET | Freq: Four times a day (QID) | ORAL | 0 refills | Status: DC | PRN

## 2017-04-11 MED ORDER — TAMSULOSIN HCL 0.4 MG PO CAPS: 0.4000 mg | ORAL_CAPSULE | Freq: Every day | ORAL | 0 refills | Status: DC

## 2017-04-11 MED ORDER — GABAPENTIN 300 MG PO CAPS: 300.0000 mg | ORAL_CAPSULE | Freq: Three times a day (TID) | ORAL | 0 refills | Status: DC

## 2017-04-11 MED ORDER — OXYCODONE HCL 5 MG PO TABS: 5.0000 mg | ORAL_TABLET | ORAL | 0 refills | Status: DC | PRN

## 2017-04-11 MED ORDER — SENNA 8.6 MG PO TABS: 1.0000 | ORAL_TABLET | Freq: Every day | ORAL | 0 refills | Status: DC

## 2017-04-11 MED ORDER — NICOTINE 21 MG/24HR TD PT24: 21.0000 mg | MEDICATED_PATCH | Freq: Every day | TRANSDERMAL | 0 refills | Status: DC

## 2017-04-11 MED ORDER — POLYETHYLENE GLYCOL 3350 17 G PO PACK: 17.0000 g | PACK | Freq: Every day | ORAL | 0 refills | Status: DC

## 2017-04-11 MED ORDER — LORAZEPAM 0.5 MG PO TABS: 0.5000 mg | ORAL_TABLET | Freq: Two times a day (BID) | ORAL | 0 refills | Status: DC | PRN

## 2017-04-11 MED ORDER — LIP MEDEX EX OINT
TOPICAL_OINTMENT | CUTANEOUS | Status: DC | PRN
  Administered 2017-04-11: 12:00:00 via TOPICAL
  Filled 2017-04-11: qty 7

## 2017-04-11 NOTE — Clinical Social Work Placement (Addendum)
    Patient and spouse chose bed at [facility].  LCSW confirmed bed with facility.  LCSW faxed dc docs to facility.   Patient to transport by [facility].  LCSW notified family of transport.  RN report #: [phone] Give report to Leeann Must, [place]  BK[facility]  CLINICAL SOCIAL WORK PLACEMENT  NOTE  Date:  04/11/2017  Patient Details  Name: [name] MRN: [id number] Date of Birth: 09/23/1950  Clinical Social Work is seeking post-discharge placement for this patient at the [place] level of care (*CSW will initial, date and re-position this form in  chart as items are completed):  Yes   Patient/family provided with [facility] Work Department's list of facilities offering this level of care within the geographic area requested by the patient (or if unable, by the patient's family).  Yes   Patient/family informed of their freedom to choose among providers that offer the needed level of care, that participate in Medicare, Medicaid or managed care program needed by the patient, have an available bed and are willing to accept the patient.  Yes   Patient/family informed of [facility]'s ownership interest in [facility] and [facility], as well as of the fact that they are under no obligation to receive care at these facilities.  PASRR submitted to EDS on       PASRR number received on 04/07/17     Existing PASRR number confirmed on       FL2 transmitted to all facilities in geographic area requested by pt/family on 04/07/17     FL2 transmitted to all facilities within larger geographic area on       Patient informed that his/her managed care company has contracts with or will negotiate with certain facilities, including the following:        Yes   Patient/family informed of bed offers received.  Patient chooses bed at [facility]     Physician recommends and patient chooses bed at      Patient to be transferred to [place] on  04/11/17.  Patient to be transferred to facility by EMS     Patient family notified on 04/11/17 of transfer.  Name of family member notified:  Legrand Como     PHYSICIAN       Additional Comment:    _______________________________________________ Servando Snare, LCSW 04/11/2017, 10:54 AM

## 2017-04-11 NOTE — Progress Notes (Signed)
Report given to [place], LPN at [place]. No questions or concerns at this time. Patient will be transferred via [place].

## 2017-04-11 NOTE — Progress Notes (Signed)
Occupational Therapy Treatment Patient Details Name: [name] MRN: [id number] DOB: 1950-10-14 Today's Date: 04/11/2017    History of present illness [name] is a 67 y.o. female with medical history significant of chronic pancreatitis, chronic abdominal pain on p.o. Dilaudid and Ativan at home came in with complaints of nausea vomiting abdominal pain; pt has had 10 ED visists in 6 mos    OT comments  Pt with much better participation this day  Follow Up Recommendations  SNF    Equipment Recommendations  None recommended by OT    Recommendations for Other Services      Precautions / Restrictions Precautions Precautions: Fall       Mobility Bed Mobility Overal bed mobility: Needs Assistance Bed Mobility: Supine to Sit;Sit to Supine     Supine to sit: Supervision Sit to supine: Supervision   General bed mobility comments: for safety, pt moving without awareness of lines  Transfers Overall transfer level: Needs assistance Equipment used: Rolling walker (2 wheeled) Transfers: Sit to/from [facility] Sit to Stand: Mod assist Stand pivot transfers: Mod assist       General transfer comment: cues for safety    Balance Overall balance assessment: Needs assistance;History of Falls Sitting-balance support: No upper extremity supported;Feet supported Sitting balance-Leahy Scale: Good       Standing balance-Leahy Scale: Poor                             ADL either performed or assessed with clinical judgement   ADL Overall ADL's : Needs assistance/impaired Eating/Feeding: Set up;Sitting   Grooming: Standing;Moderate assistance Grooming Details (indicate cue type and reason): pt with posterior lean                 Toilet Transfer: Moderate assistance;Stand-pivot;Cueing for sequencing;Cueing for safety;BSC Toilet Transfer Details (indicate cue type and reason): pt has posterior lean in standing and with initial sit to  stand Toileting- Clothing Manipulation and Hygiene: Moderate assistance;Sit to/from stand;Cueing for safety;Cueing for sequencing         General ADL Comments: posterior lean with lack of balance reaction contributes to pt fall risk.       Vision       Perception     Praxis      Cognition Arousal/Alertness: Awake/alert Behavior During Therapy: WFL for tasks assessed/performed Overall Cognitive Status: Within Functional Limits for tasks assessed                                 General Comments: much more participative this OT session                   Pertinent Vitals/ Pain       Pain Score: 4  Pain Location: r hip Pain Descriptors / Indicators: Sore Pain Intervention(s): Limited activity within patient's tolerance;Monitored during session         Frequency  Min 2X/week        Progress Toward Goals  OT Goals(current goals can now be found in the care plan section)  Progress towards OT goals: Progressing toward goals     Plan Discharge plan remains appropriate    Co-evaluation                 AM-PAC PT "6 Clicks" Daily Activity     Outcome Measure   Help from another person eating  meals?: None Help from another person taking care of personal grooming?: A Little Help from another person toileting, which includes using toliet, bedpan, or urinal?: A Lot Help from another person bathing (including washing, rinsing, drying)?: A Little Help from another person to put on and taking off regular upper body clothing?: A Lot Help from another person to put on and taking off regular lower body clothing?: A Little 6 Click Score: 17    End of Session Equipment Utilized During Treatment: Gait belt  OT Visit Diagnosis: Unsteadiness on feet (R26.81);Repeated falls (R29.6)   Activity Tolerance Patient tolerated treatment well   Patient Left in bed;with call bell/phone within reach;with bed alarm set(bed alarm would not set due to decerased  weight- RN aware)   Nurse Communication Mobility status        Time: 3005-1102 OT Time Calculation (min): 15 min  Charges: OT General Charges $OT Visit: 1 Visit OT Treatments $Self Care/Home Management : 8-22 mins  [place], [place]   Betsy Pries 04/11/2017, 3:28 PM

## 2017-04-11 NOTE — Discharge Summary (Signed)
[facility]  Physician Discharge Summary   Patient ID: [name] MRN: [id number] DOB/AGE: 1950-11-18 67 y.o.  Admit date: 04/03/2017 Discharge date: 04/11/2017  PCP: Scot Jun, FNP  DISCHARGE DIAGNOSES:  Principal Problem:   Benzodiazepine dependence ([place]) Active Problems:   Anxiety state   Nausea and vomiting   Benzodiazepine withdrawal (HCC)   Tobacco abuse   Abdominal pain   Hyponatremia   Underweight   COPD (chronic obstructive pulmonary disease) (HCC)   Narcotic addiction (HCC)   Abdominal pain, chronic, epigastric- due to chronic pancreatitis   AKI (acute kidney injury) ([place])   Acute urinary retention   Pressure injury of skin   Pressure injury of skin of right ischial tuberosity region: Stage 2   RECOMMENDATIONS FOR OUTPATIENT FOLLOW UP: 1. CBC and basic metabolic panel in 1 week 2. Try to get an appointment with the internal medicine Suboxone clinic with Dr. Evette Doffing at [place]: fair  Diet recommendation: Low fat diet  Filed Weights   04/03/17 1133 04/03/17 1728  Weight: 31.8 kg (70 lb) 32.4 kg (71 lb 8 oz)    INITIAL HISTORY: [name] a 67 y.o.femalewith medical history significant ofchronic pancreatitis, chronic abdominal pain on p.o. Dilaudid and Ativan at home came in with complaints of nausea vomiting abdominal pain and not able to keep any food down.  Patient has been reported that his wife was on Dilaudid and Ativan for over 20 years. She got Dilaudid 120 tablets/month and 90 tablets of Ativan. This was prescribed by Dr. Alyson Ingles.  She was seen by a new pain management specialist who switched her over to oxycodone.  Patient also started having worsening confusion.  There was concern for drug overdose but also for withdrawal effects.  She was also prescribed Elavil recently.  She developed urinary retention during this hospitalization.  She was seen by psychiatry.    Consultations:  Psychiatry   HOSPITAL COURSE:   Polysubstance abuse/polypharmacy/opioid and benzo withdrawal Patient has been on Dilaudid and Ativan for greater than 20 years.  Recently medication changes were made by her pain management specialist.  Followed by Dr. Jeanella Anton.  Apparently this provider will not like to see the patient back as patient did not take the oxycodone as instructed.  Patient recently started on Elavil as well.  Seen by psychiatry during this hospitalization.  Elavil was discontinued.    Patient was given clonidine to peripheral withdrawal symptoms.  She has completed the course.  She was also being slowly weaned down on the IV hydromorphone.  She currently remains on oxycodone orally as needed.  Continue to slowly wean down.  She is also on low-dose Ativan.  She seems to be hemodynamically stable.  No suicidal or homicidal ideation.  Case was also discussed with Dr. Evette Doffing at the Suboxone clinic who recommended outpatient referral when patient is discharged.  The office number is [id number]. Patient is medically stable currently.  No signs or symptoms of withdrawal. Physical therapy is recommending skilled nursing facility.    Urinary retention Thought to be secondary to anticholinergic effects from tricyclics.  Elavil has been discontinued.  Voiding trial was attempted on 4/2 however patient failed.  Foley catheter has to be reinserted.  Patient complaining of lot of irritation around the Foley catheter site.  She is requesting that the catheter be removed.  Foley catheter was removed on 4/7.  Patient has voided on her own.  Continue Flomax for 7 more days.  Severe protein calorie  malnutrition Continue nutritional supplements.  Acute kidney injury Secondary to prerenal azotemia due to poor oral intake.  Renal function has improved.  IV fluids discontinued.  Hypomagnesemia Repleted  History of chronic pancreatitis with chronic abdominal pain Lipase was  normal.  Tolerating oral diet.  Continue dicyclomine as needed for spasms and cramps.  Creon.  History of COPD Stable.  Continue home medications.  History of tobacco abuse Counseled.  Continue nicotine patch.  Hyponatremia Due to poor oral intake.  Improved.  Normocytic anemia Hemoglobin stable.  No evidence of overt bleeding.  Likely dilutional.  Pressure injury stage II right ischial tuberosity Wound care was provided.  Right hip and lower extremity pain X-ray of the hip did not show any acute fractures.  This is likely chronic.  She was able to lift the legs off the bed today.    Patient remains stable.  Okay for discharge to skilled nursing facility.     PERTINENT LABS:  The results of significant diagnostics from this hospitalization (including imaging, microbiology, ancillary and laboratory) are listed below for reference.      Labs: Basic Metabolic Panel: Recent Labs  Lab 04/05/17 0600 04/06/17 0616 04/07/17 0945 04/09/17 1032  NA 136 140 135 137  K 4.1 4.1 3.7 4.5  CL 109 111 107 108  CO2 _0 19*  GLUCOSE 102* 73 135* 153*  BUN _1 CREATININE 0.87 0.87 0.89 0.80  CALCIUM 8.7* 9.3 8.4* 8.9  MG 1.7 1.6* 1.9  --   PHOS 3.4 3.1  --   --    Liver Function Tests: Recent Labs  Lab 04/05/17 0600 04/06/17 0616  AST 19 17  ALT 10* 10*  ALKPHOS 76 90  BILITOT 0.3 0.4  PROT 5.5* 6.6  ALBUMIN 3.0* 3.4*   CBC: Recent Labs  Lab 04/05/17 0600 04/06/17 0616  WBC 4.5 5.6  NEUTROABS 2.9 3.7  HGB 9.6* 10.7*  HCT 28.7* 32.9*  MCV 92.6 94.0  PLT 162 186    IMAGING STUDIES Dg Hip Unilat With Pelvis 2-3 Views Right  Result Date: 04/08/2017 CLINICAL DATA:  RIGHT hip pain, no injury. EXAM: DG HIP (WITH OR WITHOUT PELVIS) 2-3V RIGHT COMPARISON:  RIGHT hip radiographs November 14, 2016 FINDINGS: There is no evidence of hip fracture or dislocation. There is no evidence of arthropathy or other focal bone abnormality. Osteopenia. Status post  RIGHT femoral neck pinning with intramedullary rod. Stable heterotopic ossification RIGHT greater trochanter and to lesser extent lesser trochanter. Imaged hardware is intact without periprosthetic lucency. LEFT femur dynamic screw with lateral plate and screw fixation, incompletely imaged. Phleboliths project in the pelvis. Aortoiliac calcifications. IMPRESSION: 1. No acute fracture deformity or dislocation. Status post bilateral femur ORIF. Electronically Signed   By: Elon Alas M.D.   On: 04/08/2017 16:52   Xr Femur, Min 2 Views Right  Result Date: 03/25/2017 Films of the right femur were obtained in several projections.  Intramedullary nail extends from the hip to the supra condylar region of the distal femur.  No obvious complications.  Appears to have had interval trochanteric and possibly subtrochanteric fracture with excellent position and healing.  Complications about the femoral head.  Hip joint appears to be intact.  Some mild myositis ossificans along the tip of the greater trochanter  Xr Lumbar Spine 2-3 Views  Result Date: 03/25/2017 Films of the lumbar spine were obtained in AP and lateral projections.  There is a left sided scoliosis measuring probably 5-6 degrees.  Diffuse gas in the colon.  Diffuse calcification of the aorta vessels without obvious aneurysmal dilatation.  Lateral view reveals decreased disc space height between L5 and S1.  No listhesis.   DISCHARGE EXAMINATION: Vitals:   04/10/17 2119 04/10/17 2228 04/11/17 0508 04/11/17 0857  BP: 132/89  134/72   Pulse: 82  82   Resp: 18  18   Temp: 98.4 F (36.9 C)  98.2 F (36.8 C)   TempSrc: Oral  Oral   SpO2: 98% 99% 99% 100%  Weight:      Height:       General appearance: alert, cooperative, appears stated age and no distress Resp: clear to auscultation bilaterally Cardio: regular rate and rhythm, S1, S2 normal, no murmur, click, rub or gallop GI: soft, non-tender; bowel sounds normal; no masses,  no  organomegaly Extremities: extremities normal, atraumatic, no cyanosis or edema  DISPOSITION: SNF  Discharge Instructions    Call MD for:  extreme fatigue   Complete by:  As directed    Call MD for:  persistant dizziness or light-headedness   Complete by:  As directed    Call MD for:  persistant nausea and vomiting   Complete by:  As directed    Call MD for:  severe uncontrolled pain   Complete by:  As directed    Call MD for:  temperature >100.4   Complete by:  As directed    Discharge instructions   Complete by:  As directed    Please review instructions on the discharge summary.  You were cared for by a hospitalist during your hospital stay. If you have any questions about your discharge medications or the care you received while you were in the hospital after you are discharged, you can call the unit and asked to speak with the hospitalist on call if the hospitalist that took care of you is not available. Once you are discharged, your primary care physician will handle any further medical issues. Please note that NO REFILLS for any discharge medications will be authorized once you are discharged, as it is imperative that you return to your primary care physician (or establish a relationship with a primary care physician if you do not have one) for your aftercare needs so that they can reassess your need for medications and monitor your lab values. If you do not have a primary care physician, you can call [phone] for a physician referral.   Increase activity slowly   Complete by:  As directed          Allergies as of 04/11/2017      Reactions   Aspirin Other (See Comments)   bleeding   Chlorpromazine Hcl Other (See Comments)   Muscle spasms      Medication List    STOP taking these medications   amitriptyline 25 MG tablet Commonly known as:  ELAVIL   ciprofloxacin 500 MG tablet Commonly known as:  CIPRO   cyproheptadine 4 MG tablet Commonly known as:  PERIACTIN    HYDROmorphone 4 MG tablet Commonly known as:  DILAUDID   metroNIDAZOLE 500 MG tablet Commonly known as:  FLAGYL   NARCAN 4 MG/0.1ML Liqd nasal spray kit Generic drug:  naloxone     TAKE these medications   albuterol 108 (90 Base) MCG/ACT inhaler Commonly known as:  PROVENTIL HFA;VENTOLIN HFA Inhale 2 puffs into the lungs every 6 (six) hours as needed for wheezing or shortness of breath.   CREON 12000 units Cpep capsule Generic drug:  lipase/protease/amylase Take 12,000 Units by mouth 3 (three) times daily with meals.   dicyclomine 20 MG tablet Commonly known as:  BENTYL Take 1 tablet (20 mg total) by mouth every 6 (six) hours as needed for spasms (abdominal cramping).   gabapentin 300 MG capsule Commonly known as:  NEURONTIN Take 1 capsule (300 mg total) by mouth 3 (three) times daily.   ipratropium-albuterol 0.5-2.5 (3) MG/3ML Soln Commonly known as:  DUONEB Take 3 mLs by nebulization every 6 (six) hours as needed (dyspnea not releived by inhaler).   LORazepam 0.5 MG tablet Commonly known as:  ATIVAN Take 1 tablet (0.5 mg total) by mouth 2 (two) times daily as needed for anxiety. What changed:    when to take this  reasons to take this  Another medication with the same name was removed. Continue taking this medication, and follow the directions you see here.   losartan 50 MG tablet Commonly known as:  COZAAR Take 1 tablet (50 mg total) by mouth daily.   mometasone-formoterol 200-5 MCG/ACT Aero Commonly known as:  DULERA Inhale 2 puffs into the lungs 2 (two) times daily.   nicotine 21 mg/24hr patch Commonly known as:  NICODERM CQ - dosed in mg/24 hours Place 1 patch (21 mg total) onto the skin daily.   omeprazole 20 MG capsule Commonly known as:  PRILOSEC Take 1 capsule (20 mg total) by mouth daily.   ondansetron 4 MG tablet Commonly known as:  ZOFRAN Take 4 mg by mouth 3 (three) times daily as needed for nausea/vomiting.   oxyCODONE 5 MG immediate  release tablet Commonly known as:  Oxy IR/ROXICODONE Take 1 tablet (5 mg total) by mouth every 4 (four) hours as needed for severe pain. What changed:    medication strength  how much to take  when to take this  reasons to take this   polyethylene glycol packet Commonly known as:  MIRALAX / GLYCOLAX Take 17 g by mouth daily.   senna 8.6 MG Tabs tablet Commonly known as:  SENOKOT Take 1 tablet (8.6 mg total) by mouth at bedtime.   tamsulosin 0.4 MG Caps capsule Commonly known as:  FLOMAX Take 1 capsule (0.4 mg total) by mouth daily. For 7 more days   tiotropium 18 MCG inhalation capsule Commonly known as:  SPIRIVA HANDIHALER INHALE 1 CAPSULE VIA HANDIHALER ONCE DAILY        Follow-up Information    Scot Jun, FNP. Schedule an appointment as soon as possible for a visit.   Specialty:  Family Medicine Contact information: [place] [address] [phone]        Axel Filler, MD Follow up.   Specialty:  Internal Medicine Why:  to consider suboxone Contact information: [address] [address] [phone]           TOTAL DISCHARGE TIME: 35 minutes  Bonnielee Haff  [facility] Pager [phone]  04/11/2017, 10:42 AM

## 2017-04-11 NOTE — Progress Notes (Signed)
Left message for [name] at [facility] with number to call back for report.

## 2017-05-12 ENCOUNTER — Other Ambulatory Visit: Payer: Self-pay | Admitting: Family Medicine

## 2017-05-20 ENCOUNTER — Ambulatory Visit (HOSPITAL_COMMUNITY): Payer: Self-pay

## 2017-05-20 ENCOUNTER — Other Ambulatory Visit: Payer: Self-pay

## 2017-05-20 ENCOUNTER — Encounter (HOSPITAL_COMMUNITY): Payer: Self-pay

## 2017-05-20 ENCOUNTER — Emergency Department (HOSPITAL_COMMUNITY)
Admission: EM | Admit: 2017-05-20 | Discharge: 2017-05-20 | Discharge status: Against Medical Advice | Payer: Medicaid Other | Attending: Emergency Medicine | Admitting: Emergency Medicine

## 2017-05-20 ENCOUNTER — Emergency Department (HOSPITAL_COMMUNITY): Payer: Medicaid Other

## 2017-05-20 DIAGNOSIS — F17210 Nicotine dependence, cigarettes, uncomplicated: Secondary | ICD-10-CM | POA: Diagnosis not present

## 2017-05-20 DIAGNOSIS — R109 Unspecified abdominal pain: Secondary | ICD-10-CM | POA: Insufficient documentation

## 2017-05-20 DIAGNOSIS — Z85528 Personal history of other malignant neoplasm of kidney: Secondary | ICD-10-CM | POA: Insufficient documentation

## 2017-05-20 DIAGNOSIS — J449 Chronic obstructive pulmonary disease, unspecified: Secondary | ICD-10-CM | POA: Diagnosis not present

## 2017-05-20 DIAGNOSIS — I10 Essential (primary) hypertension: Secondary | ICD-10-CM | POA: Diagnosis not present

## 2017-05-20 LAB — COMPREHENSIVE METABOLIC PANEL
ALBUMIN: 4.3 g/dL (ref 3.5–5.0)
ALT: 12 U/L — AB (ref 14–54)
AST: 16 U/L (ref 15–41)
Alkaline Phosphatase: 103 U/L (ref 38–126)
Anion gap: 13 (ref 5–15)
BUN: 15 mg/dL (ref 6–20)
CHLORIDE: 102 mmol/L (ref 101–111)
CO2: 22 mmol/L (ref 22–32)
CREATININE: 0.94 mg/dL (ref 0.44–1.00)
Calcium: 9.9 mg/dL (ref 8.9–10.3)
GFR calc Af Amer: >60 mL/min (ref >60)
Glucose, Bld: 123 mg/dL — ABNORMAL HIGH (ref 65–99)
Potassium: 3.7 mmol/L (ref 3.5–5.1)
SODIUM: 137 mmol/L (ref 135–145)
Total Bilirubin: 0.5 mg/dL (ref 0.3–1.2)
Total Protein: 7.9 g/dL (ref 6.5–8.1)

## 2017-05-20 LAB — CBC
HEMATOCRIT: 37.7 % (ref 36.0–46.0)
Hemoglobin: 13 g/dL (ref 12.0–15.0)
MCH: 31.1 pg (ref 26.0–34.0)
MCHC: 34.5 g/dL (ref 30.0–36.0)
MCV: 90.2 fL (ref 78.0–100.0)
PLATELETS: 356 10*3/uL (ref 150–400)
RBC: 4.18 MIL/uL (ref 3.87–5.11)
RDW: 14.5 % (ref 11.5–15.5)
WBC: 11.6 10*3/uL — AB (ref 4.0–10.5)

## 2017-05-20 LAB — LIPASE, BLOOD: LIPASE: 22 U/L (ref 11–51)

## 2017-05-20 MED ORDER — ONDANSETRON HCL 4 MG/2ML IJ SOLN
4.0000 mg | Freq: Once | INTRAMUSCULAR | Status: AC
  Administered 2017-05-20: 4 mg via INTRAVENOUS
  Filled 2017-05-20: qty 2

## 2017-05-20 MED ORDER — IOPAMIDOL (ISOVUE-300) INJECTION 61%
INTRAVENOUS | Status: AC
  Filled 2017-05-20: qty 100

## 2017-05-20 MED ORDER — HYDROMORPHONE HCL 1 MG/ML IJ SOLN
0.5000 mg | Freq: Once | INTRAMUSCULAR | Status: AC
  Administered 2017-05-20: 0.5 mg via INTRAVENOUS
  Filled 2017-05-20: qty 1

## 2017-05-20 MED ORDER — IOHEXOL 300 MG/ML  SOLN
30.0000 mL | Freq: Once | INTRAMUSCULAR | Status: AC | PRN
Start: 2017-05-20 — End: 2017-05-20
  Administered 2017-05-20: 30 mL via ORAL

## 2017-05-20 MED ORDER — SODIUM CHLORIDE 0.9 % IV BOLUS
1000.0000 mL | Freq: Once | INTRAVENOUS | Status: AC
Start: 2017-05-20 — End: 2017-05-20
  Administered 2017-05-20: 1000 mL via INTRAVENOUS

## 2017-05-20 MED ORDER — DICYCLOMINE HCL 10 MG PO CAPS
10.0000 mg | ORAL_CAPSULE | Freq: Once | ORAL | Status: AC
  Administered 2017-05-20: 10 mg via ORAL
  Filled 2017-05-20: qty 1

## 2017-05-20 MED ORDER — IOPAMIDOL (ISOVUE-300) INJECTION 61%: 100.0000 mL | Freq: Once | INTRAVENOUS | Status: DC | PRN

## 2017-05-20 NOTE — ED Notes (Signed)
Pt informed urine sample is needed, pt states that she will can't give one at this time

## 2017-05-20 NOTE — ED Notes (Signed)
Patient requesting labs to be drawn with an ultrasound IV.

## 2017-05-20 NOTE — ED Provider Notes (Signed)
Emergency Department Provider Note   I have reviewed the triage vital signs and the nursing notes.   HISTORY  Chief Complaint Chest Pain; Abdominal Pain; and Leg Pain   HPI [name] is a 67 y.o. female multiple medical problems to include opiate and benzodiazepine dependence who presents the emergency department today secondary to "all over abdominal pain", upper chest pain consistent with anxiety, right leg pain is been there for multiple weeks is been worked up as an outpatient.  Patient states the real reason why she is here is because her belly pain.  She states it feels like previous episodes she has had were negative work-up is been completed.  States she has flares of her pancreatitis for unknown reasons.  She has a past history of alcohol use but has not used in 13 months.  Review of records it seems that she was weaned off of her Dilaudid and benzodiazepines about a month ago. It is not entirely clear whether or not she has a new pain doctor at this point. No other associated or modifying symptoms.    Past Medical History:  Diagnosis Date  . Cancer ([place])    renal ca  . COPD (chronic obstructive pulmonary disease) ([place])   . Drug-seeking behavior   . Pancreatitis   . Pancreatitis   . Seizures ([place])   . Substance abuse [facility])     Patient Active Problem List   Diagnosis Date Noted  . Pressure injury of skin 04/06/2017  . Pressure injury of skin of right ischial tuberosity region: Stage 2 04/06/2017  . Acute urinary retention 04/04/2017  . Benzodiazepine dependence ([place])   . AKI (acute kidney injury) ([place]) 04/03/2017  . Hip fracture, right ([place]) 06/02/2016  . Narcotic addiction ([place]) 02/29/2016  . Abdominal pain, chronic, epigastric- due to chronic pancreatitis 02/29/2016  . COPD (chronic obstructive pulmonary disease) ([place]) 02/28/2016  . Cough 09/03/2015  . Abdominal pain 01/03/2015  . Hyponatremia 01/03/2015  . Hypocalcemia 01/03/2015  . Underweight  01/03/2015  . COPD GOLD III with reversible component  07/17/2014  . DTs (delirium tremens) ([place]) 06/02/2013  . Chronic alcoholic pancreatitis ([place]) 06/02/2013  . Lactic acidosis 06/02/2013  . Recurrent acute pancreatitis 03/28/2013  . Alcohol withdrawal ([place]) 03/28/2013  . Pancreatitis 03/09/2013  . Tobacco abuse 03/09/2013  . 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 Acute alcoholic pancreatitis 41/96/2229  . Benzodiazepine withdrawal ([place]) 02/09/2013  . Protein-calorie malnutrition, severe ([place]) 02/09/2013  . Hypokalemia 02/27/2011  . Abdominal pain, acute 02/25/2011  . Nausea and vomiting 02/25/2011  . Thrombocytopenia ([place]) 02/25/2011  . COPD with acute exacerbation ([place]) 02/25/2011  . HTN (hypertension), malignant 02/25/2011  . KIDNEY CANCER 10/18/2008  . Anxiety state 10/18/2008  . GERD 10/18/2008  . CIRRHOSIS 10/18/2008  . PANCREATITIS, CHRONIC- atrophic pancreas 10/18/2008  . DYSPNEA 10/18/2008  . GASTRIC ULCER, HX OF 10/18/2008    Past Surgical History:  Procedure Laterality Date  . IR GENERIC HISTORICAL  05/08/2015   IR RADIOLOGIST EVAL & MGMT 05/08/2015 Aletta Edouard, MD [facility] INTERV RAD  . IR GENERIC HISTORICAL  01/08/2014   IR RADIOLOGIST EVAL & MGMT 01/08/2014 Aletta Edouard, MD [facility] INTERV RAD  . KIDNEY SURGERY     removed cancerous lesions  . PARTIAL GASTRECTOMY      Current Outpatient Rx  . Order #: [id number] Class: Historical Med  . Order #: [id number] Class: Print  . Order #: [id number] Class: Print  . Order #: [id number] Class: Fax  . Order #:  [id number] Class: Normal  . Order #: [id number] Class: Normal  . Order #: [id number] Class: Historical Med  . Order #: [id number] Class: Normal  . Order #: [id number] Class: Print    Allergies Aspirin and Chlorpromazine hcl  Family History  Problem Relation Age of Onset  . CAD Mother   . Alcoholism Father   . COPD Father     Social History Social History   Tobacco Use  . Smoking  status: Current Every Day Smoker    Packs/day: 1.00    Years: 30.00    Pack years: 30.00    Types: Cigarettes  . Smokeless tobacco: Never Used  . Tobacco comment: smoking up to 1.5 ppd per husband.   Substance Use Topics  . Alcohol use: Not Currently    Alcohol/week: 0.0 oz  . Drug use: No    Comment: Hx of polysubstance drug abuse    Review of Systems  All other systems negative except as documented in the HPI. All pertinent positives and negatives as reviewed in the HPI. ____________________________________________   PHYSICAL EXAM:  VITAL SIGNS: ED Triage Vitals  Enc Vitals Group     BP 05/20/17 1412 120/71     Pulse Rate 05/20/17 1412 66     Resp 05/20/17 1412 20     Temp 05/20/17 1412 99.4 F (37.4 C)     Temp Source 05/20/17 1412 Oral     SpO2 05/20/17 1412 96 %    Constitutional: Alert and oriented. Well appearing and in no acute distress. Eyes: Conjunctivae are normal. PERRL. EOMI. Head: Atraumatic. Nose: No congestion/rhinnorhea. Mouth/Throat: Mucous membranes are moist.  Oropharynx non-erythematous. Neck: No stridor.  No meningeal signs.   Cardiovascular: Normal rate, regular rhythm. Good peripheral circulation. Grossly normal heart sounds.   Respiratory: Normal respiratory effort.  No retractions. Lungs CTAB. Gastrointestinal: Soft and nontender. No distention.  Musculoskeletal: No lower extremity tenderness nor edema. No gross deformities of extremities. Neurologic:  Normal speech and language. No gross focal neurologic deficits are appreciated.  Skin:  Skin is warm, dry and intact. No rash noted.   ____________________________________________   LABS (all labs ordered are listed, but only abnormal results are displayed)  Labs Reviewed  COMPREHENSIVE METABOLIC PANEL - Abnormal; Notable for the following components:      Result Value   Glucose, Bld 123 (*)    ALT 12 (*)    All other components within normal limits  CBC - Abnormal; Notable for the  following components:   WBC 11.6 (*)    All other components within normal limits  LIPASE, BLOOD  URINALYSIS, ROUTINE W REFLEX MICROSCOPIC   ____________________________________________  EKG   EKG Interpretation  Date/Time:  Friday May 20 2017 15:08:55 EDT Ventricular Rate:  98 PR Interval:    QRS Duration: 72 QT Interval:  340 QTC Calculation: 435 R Axis:   83 Text Interpretation:  Sinus rhythm Biatrial enlargement Borderline right axis deviation Baseline wander in lead(s) V4 No significant change since last tracing Confirmed by Merrily Pew [phone]) on 05/20/2017 8:38:54 PM       ____________________________________________  RADIOLOGY  No results found.  ____________________________________________   PROCEDURES  Procedure(s) performed:   Procedures  Angiocath insertion Performed by: Merrily Pew  Consent: Verbal consent obtained. Risks and benefits: risks, benefits and alternatives were discussed Time out: Immediately prior to procedure a "time out" was called to verify the correct patient, procedure, equipment, support staff and site/side marked as required.  Preparation: Patient was prepped and draped in the usual sterile fashion.  Vein Location: left basilic  Ultrasound Guided  Gauge: 20  Normal blood return and flush without difficulty Patient tolerance: Patient tolerated the procedure well with no immediate complications.    ____________________________________________   INITIAL IMPRESSION / ASSESSMENT AND PLAN / ED COURSE  Suspect acute worsening of her chronic abdominal pain versus drug-seeking behavior/dependence.  Patient labs ok. Pending ct scan but patient left AMA. Was of sound mind and had decision making capacity and understood the risks of leaving before completion of workup but stated her husband was on his way and would be mad if she wasn't ready. Tried to ask her to stay for CT but she wasn't interested and will be discharged  AMA.  I encouraged PCP follow up and return if she changes her mind.  Pertinent labs & imaging results that were available during my care of the patient were reviewed by me and considered in my medical decision making (see chart for details).  ____________________________________________  FINAL CLINICAL IMPRESSION(S) / ED DIAGNOSES  Final diagnoses:  Abdominal pain, unspecified abdominal location     MEDICATIONS GIVEN DURING THIS VISIT:  Medications  iopamidol (ISOVUE-300) 61 % injection (has no administration in time range)  iopamidol (ISOVUE-300) 61 % injection 100 mL (has no administration in time range)  HYDROmorphone (DILAUDID) injection 0.5 mg (0.5 mg Intravenous Given 05/20/17 1855)  ondansetron (ZOFRAN) injection 4 mg (4 mg Intravenous Given 05/20/17 1854)  sodium chloride 0.9 % bolus 1,000 mL (0 mLs Intravenous Stopped 05/20/17 2054)  dicyclomine (BENTYL) capsule 10 mg (10 mg Oral Given 05/20/17 1857)  iohexol (OMNIPAQUE) 300 MG/ML solution 30 mL (30 mLs Oral Contrast Given 05/20/17 1932)     NEW OUTPATIENT MEDICATIONS STARTED DURING THIS VISIT:  Current Discharge Medication List      Note:  This note was prepared with assistance of Dragon voice recognition software. Occasional wrong-word or sound-a-like substitutions may have occurred due to the inherent limitations of voice recognition software.   Merrily Pew, MD 05/20/17 2123

## 2017-05-20 NOTE — ED Notes (Signed)
Pt ambulated out of department. Unable to obtain signature before pt's departure.

## 2017-05-20 NOTE — ED Notes (Signed)
Pt reports her husband is on the way and wants to leave--requesting to have IV taken out. When asked if pt would like to wait and make sure the results from CT look okay pt responds "no I think it's just my pancreatitis, I don't wanna wait." MD Mesner made aware.

## 2017-05-20 NOTE — ED Triage Notes (Signed)
Pt complaining of abdominal, chest, and R upper leg pain. She was recently d/c'd from rehab. She endorses nausea without vomiting or diarrhea. She states that she cannot eat, but is able to tolerate fluids (drinking a Coke in triage.) A&Ox4.

## 2017-05-31 IMAGING — CR DG ABDOMEN ACUTE W/ 1V CHEST
3 series · 3 of 3 positions shown · non-contrast
Comparison: 07/03/2014

CLINICAL DATA: Epigastric pain, nausea, vomiting and shortness of
breath. History of chronic pancreatitis and renal cell carcinoma.

EXAM:
DG ABDOMEN ACUTE W/ 1V CHEST

[w abdomen decub]
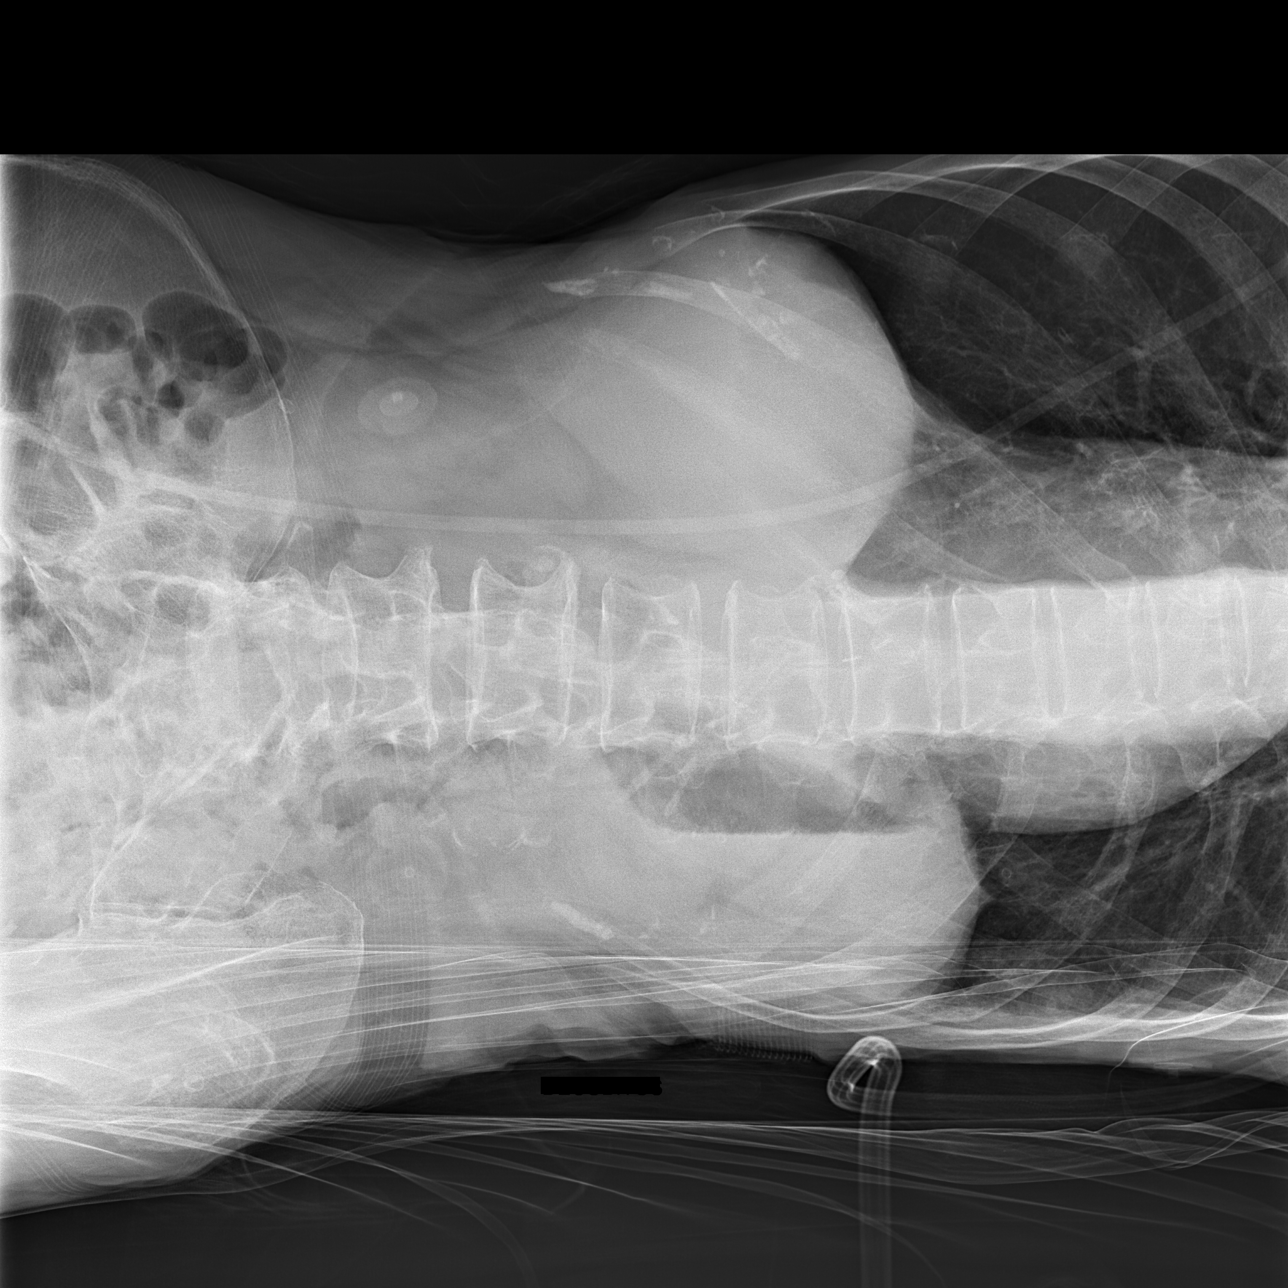

[x abdomen supine]
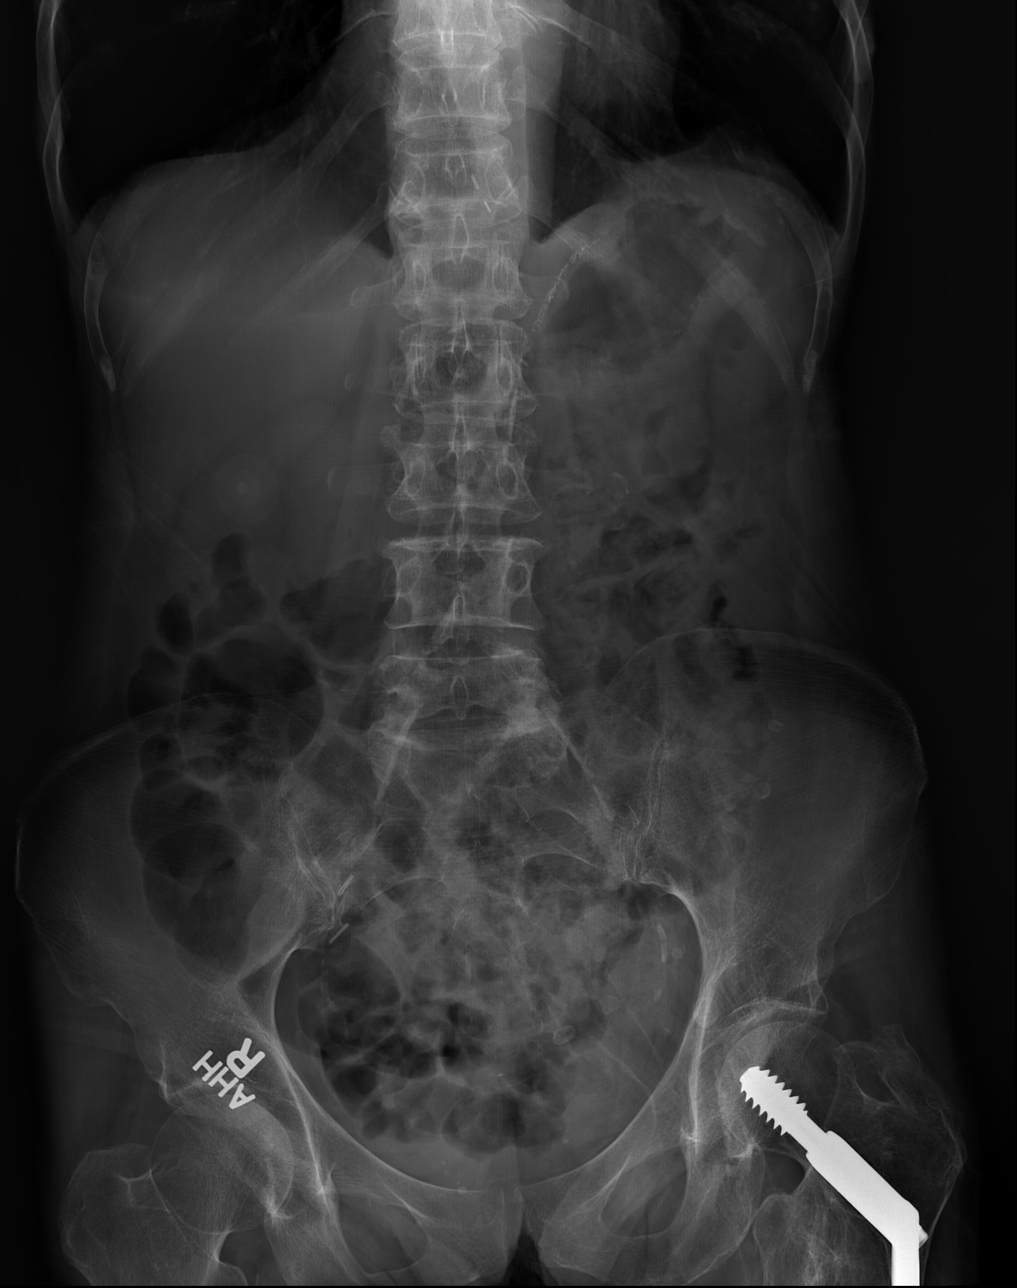

[x chest ap]
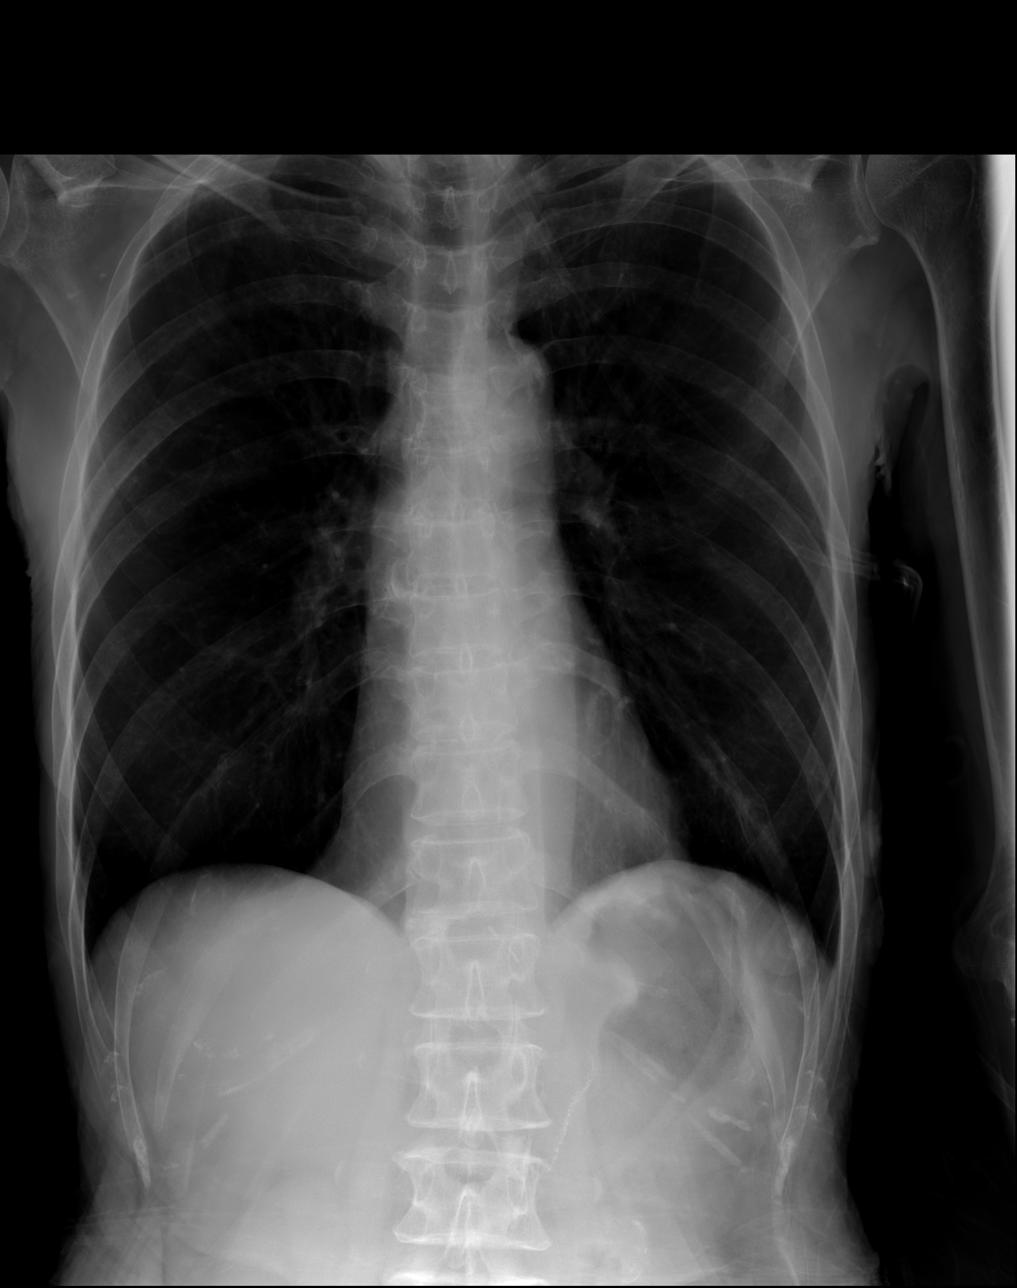

[3 of 3 positions shown; findings below may reference images not displayed]

FINDINGS: Stable pulmonary hyperinflation and COPD. There is no evidence of
pulmonary edema, consolidation, pneumothorax, nodule or pleural
fluid. Stable old left-sided lower rib fractures. The heart size and
mediastinal contours are normal.

Abdominal films show no evidence of bowel obstruction or significant
ileus. No free air is identified. No abnormal calcifications. No
soft tissue abnormalities.
IMPRESSION: No acute findings in the chest or abdomen.  Stable COPD.

## 2017-06-03 ENCOUNTER — Encounter: Payer: Self-pay | Admitting: Family Medicine

## 2017-06-03 ENCOUNTER — Ambulatory Visit (INDEPENDENT_AMBULATORY_CARE_PROVIDER_SITE_OTHER): Payer: Medicaid Other | Admitting: Family Medicine

## 2017-06-03 VITALS — BP 124/64 | HR 80 | Temp 98.0°F | Ht 64.0 in | Wt 77.4 lb

## 2017-06-03 DIAGNOSIS — R319 Hematuria, unspecified: Secondary | ICD-10-CM

## 2017-06-03 DIAGNOSIS — G894 Chronic pain syndrome: Secondary | ICD-10-CM

## 2017-06-03 DIAGNOSIS — Z09 Encounter for follow-up examination after completed treatment for conditions other than malignant neoplasm: Secondary | ICD-10-CM | POA: Diagnosis not present

## 2017-06-03 DIAGNOSIS — F411 Generalized anxiety disorder: Secondary | ICD-10-CM | POA: Diagnosis not present

## 2017-06-03 DIAGNOSIS — I10 Essential (primary) hypertension: Secondary | ICD-10-CM | POA: Diagnosis not present

## 2017-06-03 DIAGNOSIS — Z720 Tobacco use: Secondary | ICD-10-CM

## 2017-06-03 DIAGNOSIS — N390 Urinary tract infection, site not specified: Secondary | ICD-10-CM | POA: Diagnosis not present

## 2017-06-03 LAB — POCT URINALYSIS DIP (MANUAL ENTRY)
Bilirubin, UA: NEGATIVE
Glucose, UA: NEGATIVE mg/dL
Ketones, POC UA: NEGATIVE mg/dL
Nitrite, UA: NEGATIVE
Protein Ur, POC: NEGATIVE mg/dL
Spec Grav, UA: <=1.005 — AB (ref 1.010–1.025)
Urobilinogen, UA: 0.2 E.U./dL
pH, UA: 6.5 (ref 5.0–8.0)

## 2017-06-03 MED ORDER — LOSARTAN POTASSIUM 50 MG PO TABS: 50.0000 mg | ORAL_TABLET | Freq: Every day | ORAL | 3 refills | Status: DC

## 2017-06-03 MED ORDER — NICOTINE 21 MG/24HR TD PT24: 21.0000 mg | MEDICATED_PATCH | Freq: Every day | TRANSDERMAL | 3 refills | Status: DC

## 2017-06-03 MED ORDER — GABAPENTIN 300 MG PO CAPS: 300.0000 mg | ORAL_CAPSULE | Freq: Three times a day (TID) | ORAL | 3 refills | Status: DC

## 2017-06-03 MED ORDER — CREON 12000-38000 UNITS PO CPEP: 12000.0000 [IU] | ORAL_CAPSULE | Freq: Three times a day (TID) | ORAL | 3 refills | Status: DC

## 2017-06-03 MED ORDER — ONDANSETRON HCL 4 MG PO TABS: 4.0000 mg | ORAL_TABLET | Freq: Three times a day (TID) | ORAL | 3 refills | Status: DC | PRN

## 2017-06-03 MED ORDER — QUETIAPINE FUMARATE 50 MG PO TABS: 50.0000 mg | ORAL_TABLET | Freq: Two times a day (BID) | ORAL | 3 refills | Status: DC

## 2017-06-03 NOTE — Progress Notes (Signed)
Subjective:     Patient ID: [name], female   DOB: 23-Jan-1950, 66 y.o.   MRN: [id number]  PCP: Kathe Becton, NP  Chief Complaint  Patient presents with  . Follow-up    chronic condition  . choking on food everytime she eats for 2 months    HPI Ms. Twedt has a history of Seizures, Pancreatitis, COPD, and Cancer. She is here today for follow up. She is a former patient of Dr. Noland Fordyce.   Current Status: Since her last office visit, she is needed refills on some of her pain and anti-anxiety medications. Denies fevers, chills, fatigue, weight loss, ad night sweats.   She has insomnia. Denies headaches. She denies visual changes, dizziness, and falls.   She states that she has not had alcohol for a year now. She has occasional abdominal pain and nausea. Denies vomiting, diarrhea, and constipation. Her appetite is not good. She is currently trying to drink boost.   She continues to smoke a pack of cigarettes a day. She denies cough, chest pain, shortness of breath, and heart palpitations.   Her anxiety level is stable.   She denies pain today.    Past Medical History:  Diagnosis Date  . Cancer ([place])    renal ca  . COPD (chronic obstructive pulmonary disease) ([place])   . Drug-seeking behavior   . Pancreatitis   . Pancreatitis   . Seizures ([place])   . Substance abuse ([place])     Family History  Problem Relation Age of Onset  . CAD Mother   . Alcoholism Father   . COPD Father     Social History   Socioeconomic History  . Marital status: Married    Spouse name: Not on file  . Number of children: Not on file  . Years of education: Not on file  . Highest education level: Not on file  Occupational History  . Not on file  Social Needs  . Financial resource strain: Not on file  . Food insecurity:    Worry: Not on file    Inability: Not on file  . Transportation needs:    Medical: Not on file    Non-medical: Not on file  Tobacco Use  . Smoking status:  Current Every Day Smoker    Packs/day: 1.00    Years: 30.00    Pack years: 30.00    Types: Cigarettes  . Smokeless tobacco: Never Used  . Tobacco comment: smoking up to 1.5 ppd per husband.   Substance and Sexual Activity  . Alcohol use: Not Currently    Alcohol/week: 0.0 oz  . Drug use: No    Comment: Hx of polysubstance drug abuse  . Sexual activity: Never  Lifestyle  . Physical activity:    Days per week: Not on file    Minutes per session: Not on file  . Stress: Not on file  Relationships  . Social connections:    Talks on phone: Not on file    Gets together: Not on file    Attends religious service: Not on file    Active member of club or organization: Not on file    Attends meetings of clubs or organizations: Not on file    Relationship status: Not on file  . Intimate partner violence:    Fear of current or ex partner: Not on file    Emotionally abused: Not on file    Physically abused: Not on file    Forced sexual activity: Not on  file  Other Topics Concern  . Not on file  Social History Narrative  . Not on file    Past Surgical History:  Procedure Laterality Date  . IR GENERIC HISTORICAL  05/08/2015   IR RADIOLOGIST EVAL & MGMT 05/08/2015 Aletta Edouard, MD [facility] INTERV RAD  . IR GENERIC HISTORICAL  01/08/2014   IR RADIOLOGIST EVAL & MGMT 01/08/2014 Aletta Edouard, MD [facility] INTERV RAD  . KIDNEY SURGERY     removed cancerous lesions  . PARTIAL GASTRECTOMY     Immunization History  Administered Date(s) Administered  . Influenza Split 10/22/2013, 10/27/2014, 08/05/2015  . Pneumococcal Conjugate-13 09/02/2014  . Pneumococcal Polysaccharide-23 02/11/2013    Current Meds  Medication Sig  . CREON 12000 units CPEP capsule Take 1 capsule (12,000 Units total) by mouth 3 (three) times daily with meals.  . gabapentin (NEURONTIN) 300 MG capsule Take 1 capsule (300 mg total) by mouth 3 (three) times daily.  Marland Kitchen losartan (COZAAR) 50 MG tablet Take 1 tablet (50 mg total) by  mouth daily.  . mometasone-formoterol (DULERA) 200-5 MCG/ACT AERO Inhale 2 puffs into the lungs 2 (two) times daily.  . nicotine (NICODERM CQ - DOSED IN MG/24 HOURS) 21 mg/24hr patch Place 1 patch (21 mg total) onto the skin daily.  . ondansetron (ZOFRAN) 4 MG tablet Take 1 tablet (4 mg total) by mouth 3 (three) times daily as needed.  . SUBOXONE 4-1 MG FILM Take 4 mg by mouth.  . tiotropium (SPIRIVA HANDIHALER) 18 MCG inhalation capsule INHALE 1 CAPSULE VIA HANDIHALER ONCE DAILY  . [DISCONTINUED] CREON 12000 units CPEP capsule Take 12,000 Units by mouth 3 (three) times daily with meals.  . [DISCONTINUED] gabapentin (NEURONTIN) 300 MG capsule Take 1 capsule (300 mg total) by mouth 3 (three) times daily.  . [DISCONTINUED] gabapentin (NEURONTIN) 300 MG capsule Take 1 capsule (300 mg total) by mouth 3 (three) times daily.  . [DISCONTINUED] losartan (COZAAR) 50 MG tablet Take 1 tablet (50 mg total) by mouth daily.  . [DISCONTINUED] nicotine (NICODERM CQ - DOSED IN MG/24 HOURS) 21 mg/24hr patch Place 1 patch (21 mg total) onto the skin daily.  . [DISCONTINUED] nicotine (NICODERM CQ - DOSED IN MG/24 HOURS) 21 mg/24hr patch Place 1 patch (21 mg total) onto the skin daily.  . [DISCONTINUED] ondansetron (ZOFRAN) 4 MG tablet TAKE 1 TABLET BY MOUTH THREE TIMES DAILY AS NEEDED  . [DISCONTINUED] QUEtiapine (SEROQUEL) 25 MG tablet Take 50 mg by mouth daily.   Allergies  Allergen Reactions  . Aspirin Other (See Comments)    Break out in welts  . Chlorpromazine Hcl Other (See Comments)    Muscle spasms    BP 124/64 (BP Location: Left Arm, Patient Position: Sitting, Cuff Size: Small)   Pulse 80   Temp 98 F (36.7 C) (Oral)   Ht 5\' 4"  (1.626 m)   Wt 77 lb 6.4 oz (35.1 kg)   SpO2 92%   BMI 13.29 kg/m   Review of Systems  Constitutional: Negative.   HENT: Negative.   Eyes: Negative.   Respiratory: Negative.   Cardiovascular: Negative.   Gastrointestinal: Negative.   Endocrine: Negative.    Genitourinary: Negative.   Musculoskeletal: Negative.   Skin: Negative.   Allergic/Immunologic: Negative.   Neurological: Positive for dizziness.  Hematological: Negative.   Psychiatric/Behavioral: Negative.    Objective:   Physical Exam  Constitutional: She is oriented to person, place, and time. She appears well-developed and well-nourished.  HENT:  Head: Normocephalic and atraumatic.  Right Ear:  External ear normal.  Left Ear: External ear normal.  Nose: Nose normal.  Mouth/Throat: Oropharynx is clear and moist.  Eyes: Pupils are equal, round, and reactive to light. Conjunctivae and EOM are normal.  Neck: Normal range of motion. Neck supple.  Cardiovascular: Normal rate, regular rhythm, normal heart sounds and intact distal pulses.  Pulmonary/Chest: Effort normal and breath sounds normal.  Abdominal: Soft. Bowel sounds are normal.  Musculoskeletal: Normal range of motion.  Neurological: She is alert and oriented to person, place, and time.  Skin: Skin is warm and dry. Capillary refill takes less than 2 seconds.  Psychiatric: She has a normal mood and affect. Her behavior is normal. Judgment and thought content normal.  Nursing note and vitals reviewed.  Assessment:   Plan:   1. HTN (hypertension), malignant Blood pressure is 124/64 today. Continue Cozaar as directed.  - POCT urinalysis dipstick  2. GAD (generalized anxiety disorder) Stable. We will refill Seroquel today.  - QUEtiapine (SEROQUEL) 50 MG tablet; Take 1 tablet (50 mg total) by mouth 2 (two) times daily.  Dispense: 60 tablet; Refill: 3  3. Chronic pain syndrome She will continue to follow up with pain management.  - gabapentin (NEURONTIN) 300 MG capsule; Take 1 capsule (300 mg total) by mouth 3 (three) times daily.  Dispense: 90 capsule; Refill: 3  4. Tobacco abuse - nicotine (NICODERM CQ - DOSED IN MG/24 HOURS) 21 mg/24hr patch; Place 1 patch (21 mg total) onto the skin daily.  Dispense: 28 patch;  Refill: 3  5. Urinary tract infection - Rx for Augmentin 875 mg BID to pharmacy.  6. Follow up She will follow up in 2 months.  Meds ordered this encounter  Medications  . QUEtiapine (SEROQUEL) 50 MG tablet    Sig: Take 1 tablet (50 mg total) by mouth 2 (two) times daily.    Dispense:  60 tablet    Refill:  3  . CREON 12000 units CPEP capsule    Sig: Take 1 capsule (12,000 Units total) by mouth 3 (three) times daily with meals.    Dispense:  270 capsule    Refill:  3  . DISCONTD: gabapentin (NEURONTIN) 300 MG capsule    Sig: Take 1 capsule (300 mg total) by mouth 3 (three) times daily.    Dispense:  90 capsule    Refill:  3  . losartan (COZAAR) 50 MG tablet    Sig: Take 1 tablet (50 mg total) by mouth daily.    Dispense:  30 tablet    Refill:  3  . DISCONTD: nicotine (NICODERM CQ - DOSED IN MG/24 HOURS) 21 mg/24hr patch    Sig: Place 1 patch (21 mg total) onto the skin daily.    Dispense:  28 patch    Refill:  3  . ondansetron (ZOFRAN) 4 MG tablet    Sig: Take 1 tablet (4 mg total) by mouth 3 (three) times daily as needed.    Dispense:  30 tablet    Refill:  3  . nicotine (NICODERM CQ - DOSED IN MG/24 HOURS) 21 mg/24hr patch    Sig: Place 1 patch (21 mg total) onto the skin daily.    Dispense:  28 patch    Refill:  3  . gabapentin (NEURONTIN) 300 MG capsule    Sig: Take 1 capsule (300 mg total) by mouth 3 (three) times daily.    Dispense:  90 capsule    Refill:  Stella,  MSN, FNP-BC Patient  [facility] [address] [place], [place] [address] [phone]

## 2017-06-07 MED ORDER — AMOXICILLIN-POT CLAVULANATE 875-125 MG PO TABS: 1.0000 | ORAL_TABLET | Freq: Two times a day (BID) | ORAL | 0 refills | Status: DC

## 2017-06-08 ENCOUNTER — Telehealth (INDEPENDENT_AMBULATORY_CARE_PROVIDER_SITE_OTHER): Payer: Self-pay | Admitting: Orthopaedic Surgery

## 2017-06-08 ENCOUNTER — Telehealth: Payer: Self-pay

## 2017-06-08 NOTE — Telephone Encounter (Signed)
Patient notified and will pick up medication  

## 2017-06-08 NOTE — Telephone Encounter (Signed)
-----   Message from Azzie Glatter, [place] sent at 06/07/2017 10:46 PM EDT ----- Regarding: "New Rx" Carrie please call patient to let her know that she has a UTI. Rx for Augmentin 875 ng BID has been called to pharmacy.   Thanks,  Lanelle Bal.

## 2017-06-08 NOTE — Telephone Encounter (Signed)
Patient has not had MRI for Lspine. Insurance waiting on Peer to Peer with Dr. Durward Fortes according to MRI referral. Patient having a lot of pain still, and husband asking if rx can be sent into [facility] on [address]. Patients spouse requesting a call if something is called in for patient. Husband would like to know why doctor has not done Peer to Peer yet. Please call husband to discuss these issues.

## 2017-06-09 ENCOUNTER — Other Ambulatory Visit (INDEPENDENT_AMBULATORY_CARE_PROVIDER_SITE_OTHER): Payer: Self-pay | Admitting: Radiology

## 2017-06-09 MED ORDER — TRAMADOL HCL 50 MG PO TABS: ORAL_TABLET | ORAL | 0 refills | Status: DC

## 2017-06-09 NOTE — Telephone Encounter (Signed)
Spoke with husband and apologized for miscommunication when i spoke to him 1 mo ago and he stated his wife was in the hospital and was gong to try to get them to get the MRI. The hospital did not perform it but I will do the peer to peer today and see if Dr. Durward Fortes will call in some pain meds that the pt is requesting when he gets here from surgery.

## 2017-06-11 ENCOUNTER — Emergency Department (HOSPITAL_COMMUNITY): Payer: Medicaid Other

## 2017-06-11 ENCOUNTER — Encounter (HOSPITAL_COMMUNITY): Payer: Self-pay | Admitting: Emergency Medicine

## 2017-06-11 ENCOUNTER — Other Ambulatory Visit: Payer: Self-pay

## 2017-06-11 ENCOUNTER — Emergency Department (HOSPITAL_COMMUNITY)
Admission: EM | Admit: 2017-06-11 | Discharge: 2017-06-11 | Disposition: A | Discharge status: Home / Self Care | Payer: Medicaid Other | Attending: Emergency Medicine | Admitting: Emergency Medicine

## 2017-06-11 ENCOUNTER — Emergency Department (HOSPITAL_COMMUNITY)
Admission: EM | Admit: 2017-06-11 | Discharge: 2017-06-12 | Disposition: A | Discharge status: Home / Self Care | Payer: Medicaid Other | Source: Home / Self Care | Attending: Emergency Medicine | Admitting: Emergency Medicine

## 2017-06-11 DIAGNOSIS — R1084 Generalized abdominal pain: Secondary | ICD-10-CM | POA: Diagnosis not present

## 2017-06-11 DIAGNOSIS — R062 Wheezing: Secondary | ICD-10-CM

## 2017-06-11 DIAGNOSIS — Z79899 Other long term (current) drug therapy: Secondary | ICD-10-CM | POA: Diagnosis not present

## 2017-06-11 DIAGNOSIS — I10 Essential (primary) hypertension: Secondary | ICD-10-CM | POA: Insufficient documentation

## 2017-06-11 DIAGNOSIS — F17210 Nicotine dependence, cigarettes, uncomplicated: Secondary | ICD-10-CM | POA: Insufficient documentation

## 2017-06-11 DIAGNOSIS — G8929 Other chronic pain: Secondary | ICD-10-CM | POA: Diagnosis not present

## 2017-06-11 DIAGNOSIS — J441 Chronic obstructive pulmonary disease with (acute) exacerbation: Secondary | ICD-10-CM | POA: Insufficient documentation

## 2017-06-11 DIAGNOSIS — R0602 Shortness of breath: Secondary | ICD-10-CM | POA: Diagnosis present

## 2017-06-11 DIAGNOSIS — Z85528 Personal history of other malignant neoplasm of kidney: Secondary | ICD-10-CM | POA: Insufficient documentation

## 2017-06-11 DIAGNOSIS — R109 Unspecified abdominal pain: Secondary | ICD-10-CM

## 2017-06-11 LAB — CBC WITH DIFFERENTIAL/PLATELET
BASOS ABS: 0 10*3/uL (ref 0.0–0.1)
BASOS PCT: 0 %
EOS PCT: 2 %
Eosinophils Absolute: 0.2 10*3/uL (ref 0.0–0.7)
HCT: 36 % (ref 36.0–46.0)
Hemoglobin: 11.8 g/dL — ABNORMAL LOW (ref 12.0–15.0)
Lymphocytes Relative: 13 %
Lymphs Abs: 1.4 10*3/uL (ref 0.7–4.0)
MCH: 30.6 pg (ref 26.0–34.0)
MCHC: 32.8 g/dL (ref 30.0–36.0)
MCV: 93.5 fL (ref 78.0–100.0)
Monocytes Absolute: 0.9 10*3/uL (ref 0.1–1.0)
Monocytes Relative: 9 %
NEUTROS ABS: 8.6 10*3/uL — AB (ref 1.7–7.7)
Neutrophils Relative %: 76 %
PLATELETS: 324 10*3/uL (ref 150–400)
RBC: 3.85 MIL/uL — AB (ref 3.87–5.11)
RDW: 14.6 % (ref 11.5–15.5)
WBC: 11.1 10*3/uL — AB (ref 4.0–10.5)

## 2017-06-11 LAB — COMPREHENSIVE METABOLIC PANEL
ALBUMIN: 3.8 g/dL (ref 3.5–5.0)
ALT: 12 U/L — AB (ref 14–54)
AST: 15 U/L (ref 15–41)
Alkaline Phosphatase: 105 U/L (ref 38–126)
Anion gap: 9 (ref 5–15)
BUN: 9 mg/dL (ref 6–20)
CHLORIDE: 106 mmol/L (ref 101–111)
CO2: 24 mmol/L (ref 22–32)
CREATININE: 0.75 mg/dL (ref 0.44–1.00)
Calcium: 9.5 mg/dL (ref 8.9–10.3)
GFR calc Af Amer: >60 mL/min (ref >60)
GLUCOSE: 92 mg/dL (ref 65–99)
POTASSIUM: 4 mmol/L (ref 3.5–5.1)
Sodium: 139 mmol/L (ref 135–145)
Total Bilirubin: 0.6 mg/dL (ref 0.3–1.2)
Total Protein: 7.3 g/dL (ref 6.5–8.1)

## 2017-06-11 LAB — LIPASE, BLOOD: Lipase: 17 U/L (ref 11–51)

## 2017-06-11 MED ORDER — MORPHINE SULFATE (PF) 4 MG/ML IV SOLN: 4.0000 mg | Freq: Once | INTRAVENOUS | Status: DC

## 2017-06-11 MED ORDER — KETOROLAC TROMETHAMINE 15 MG/ML IJ SOLN
30.0000 mg | Freq: Once | INTRAMUSCULAR | Status: AC
  Administered 2017-06-12: 30 mg via INTRAMUSCULAR
  Filled 2017-06-11: qty 2

## 2017-06-11 MED ORDER — METHYLPREDNISOLONE SODIUM SUCC 125 MG IJ SOLR
125.0000 mg | Freq: Once | INTRAMUSCULAR | Status: AC
  Administered 2017-06-11: 125 mg via INTRAVENOUS
  Filled 2017-06-11: qty 2

## 2017-06-11 MED ORDER — LACTATED RINGERS IV SOLN: INTRAVENOUS | Status: DC

## 2017-06-11 MED ORDER — IPRATROPIUM BROMIDE 0.02 % IN SOLN
0.5000 mg | Freq: Once | RESPIRATORY_TRACT | Status: AC
  Administered 2017-06-11: 0.5 mg via RESPIRATORY_TRACT
  Filled 2017-06-11: qty 2.5

## 2017-06-11 MED ORDER — ONDANSETRON 4 MG PO TBDP
4.0000 mg | ORAL_TABLET | Freq: Once | ORAL | Status: AC
  Administered 2017-06-12: 4 mg via ORAL
  Filled 2017-06-11: qty 1

## 2017-06-11 MED ORDER — METHYLPREDNISOLONE SODIUM SUCC 125 MG IJ SOLR: 125.0000 mg | Freq: Once | INTRAMUSCULAR | Status: DC

## 2017-06-11 MED ORDER — PREDNISONE 20 MG PO TABS: 40.0000 mg | ORAL_TABLET | Freq: Every day | ORAL | 0 refills | Status: DC

## 2017-06-11 MED ORDER — ALBUTEROL (5 MG/ML) CONTINUOUS INHALATION SOLN
10.0000 mg/h | INHALATION_SOLUTION | Freq: Once | RESPIRATORY_TRACT | Status: DC
  Filled 2017-06-11: qty 20

## 2017-06-11 MED ORDER — ALBUTEROL SULFATE (2.5 MG/3ML) 0.083% IN NEBU
5.0000 mg | INHALATION_SOLUTION | Freq: Once | RESPIRATORY_TRACT | Status: AC
  Administered 2017-06-11: 5 mg via RESPIRATORY_TRACT
  Filled 2017-06-11: qty 6

## 2017-06-11 MED ORDER — OXYCODONE-ACETAMINOPHEN 5-325 MG PO TABS
1.0000 | ORAL_TABLET | Freq: Once | ORAL | Status: AC
  Administered 2017-06-11: 1 via ORAL
  Filled 2017-06-11: qty 1

## 2017-06-11 MED ORDER — ONDANSETRON HCL 4 MG/2ML IJ SOLN
4.0000 mg | Freq: Once | INTRAMUSCULAR | Status: AC
  Administered 2017-06-11: 4 mg via INTRAVENOUS
  Filled 2017-06-11: qty 2

## 2017-06-11 NOTE — ED Triage Notes (Signed)
Pt arriving via EMS with complaint of shortness of breath. Pt has hx of COPD. Smokes 1/2 pack daily. Was seen this morning for same and left AMA. Pt received breathing tx prior to arrival with EMS and had some relief. EMS attempted to start IV with no success.

## 2017-06-11 NOTE — ED Notes (Signed)
Bed: WA09 Expected date:  Expected time:  Means of arrival:  Comments: 67 yr old Short of breath

## 2017-06-11 NOTE — ED Notes (Signed)
Pt was given coca-cola for PO/fluid challenge.

## 2017-06-11 NOTE — ED Triage Notes (Signed)
Pt brought in by EMS from home with c/o shortness of breath.  Pt took her rescue inhaler Albuterol at home but it did not help much.  Pt was given O2 at 3L/min via [place] by EMS on scene.  Pt also c/o chronic abdominal pain.

## 2017-06-11 NOTE — Discharge Instructions (Addendum)
1. Medications: albuterol, prednisone, usual home medications 2. Treatment: rest, drink plenty of fluids, begin OTC antihistamine (Zyrtec or Claritin)  3. Follow Up: Please followup with your primary doctor in 2-3 days for discussion of your diagnoses and further evaluation after today's visit; if you do not have a primary care doctor use the resource guide provided to find one; Please return to the ER for difficulty breathing, high fevers or worsening symptoms.

## 2017-06-11 NOTE — ED Notes (Signed)
Requested for an ultrasound IV.

## 2017-06-11 NOTE — ED Provider Notes (Signed)
[facility] DEPT Provider Note   CSN: [id number] Arrival date & time: 06/11/17  0246     History   Chief Complaint Chief Complaint  Patient presents with  . Shortness of Breath    HPI [name] is a 67 y.o. female with a hx of renal carcinoma, COPD, pancreatitis, seizures, substance abuse presents to the Emergency Department complaining of gradual, persistent, progressively worsening SOB onset earlier this evening.  Patient reports she used one albuterol treatment at home prior to contacting EMS.  She denies recent illness or previous history of intubation.  Patient does report that additional treatment given by EMS did improve her symptoms.  Nothing seems to make them worse.  She denies chest pain, diaphoresis, vomiting.  Patient does report associated epigastric abdominal pain.  She reports this feels like previous episodes of her pancreatitis.  She reports severe nausea and immediately asks for a Coca-Cola.  She denies vomiting, diarrhea, melena, hematochezia.  She denies fever, chills, headache, neck pain, chest pain, dysuria, hematuria.  Patient reports she has not had any alcohol in the last few months.  Record review shows that patient was seen in April after her primary care physician stopped prescribing both Ativan and Dilaudid every month.  She was admitted for what appeared to be benzodiazepine and opiate withdrawal.  At that time, she was weaned off of her narcotics and benzodiazepines.  She was seen again on 05/20/2017 for worsening abdominal pain.  At that time she was refused narcotic pain medication and she left AMA prior to the completion of her work-up.  Her labs during that visit were normal.   The history is provided by the patient and medical records. No language interpreter was used.    Past Medical History:  Diagnosis Date  . Cancer ([place])    renal ca  . COPD (chronic obstructive pulmonary disease) ([place])   . Drug-seeking behavior    . Pancreatitis   . Pancreatitis   . Seizures ([place])   . Substance abuse [facility])     Patient Active Problem List   Diagnosis Date Noted  . Pressure injury of skin 04/06/2017  . Pressure injury of skin of right ischial tuberosity region: Stage 2 04/06/2017  . Acute urinary retention 04/04/2017  . Benzodiazepine dependence ([place])   . AKI (acute kidney injury) ([place]) 04/03/2017  . Hip fracture, right ([place]) 06/02/2016  . Narcotic addiction ([place]) 02/29/2016  . Abdominal pain, chronic, epigastric- due to chronic pancreatitis 02/29/2016  . COPD (chronic obstructive pulmonary disease) ([place]) 02/28/2016  . Cough 09/03/2015  . Abdominal pain 01/03/2015  . Hyponatremia 01/03/2015  . Hypocalcemia 01/03/2015  . Underweight 01/03/2015  . COPD GOLD III with reversible component  07/17/2014  . DTs (delirium tremens) ([place]) 06/02/2013  . Chronic alcoholic pancreatitis ([place]) 06/02/2013  . Lactic acidosis 06/02/2013  . Recurrent acute pancreatitis 03/28/2013  . Alcohol withdrawal ([place]) 03/28/2013  . Pancreatitis 03/09/2013  . Tobacco abuse 03/09/2013  . Alcohol withdrawal syndrome with perceptual disturbance ([place]) 03/09/2013  . Acute alcoholic pancreatitis 81/01/7508  . Benzodiazepine withdrawal ([place]) 02/09/2013  . Protein-calorie malnutrition, severe ([place]) 02/09/2013  . Hypokalemia 02/27/2011  . Abdominal pain, acute 02/25/2011  . Nausea and vomiting 02/25/2011  . Thrombocytopenia ([place]) 02/25/2011  . COPD with acute exacerbation ([place]) 02/25/2011  . HTN (hypertension), malignant 02/25/2011  . KIDNEY CANCER 10/18/2008  . Anxiety state 10/18/2008  . GERD 10/18/2008  . CIRRHOSIS 10/18/2008  . PANCREATITIS, CHRONIC- atrophic pancreas 10/18/2008  . DYSPNEA 10/18/2008  .  GASTRIC ULCER, HX OF 10/18/2008    Past Surgical History:  Procedure Laterality Date  . IR GENERIC HISTORICAL  05/08/2015   IR RADIOLOGIST EVAL & MGMT 05/08/2015 Aletta Edouard, MD [facility] INTERV RAD  . IR GENERIC HISTORICAL   01/08/2014   IR RADIOLOGIST EVAL & MGMT 01/08/2014 Aletta Edouard, MD [facility] INTERV RAD  . KIDNEY SURGERY     removed cancerous lesions  . PARTIAL GASTRECTOMY       OB History   None      Home Medications    Prior to Admission medications   Medication Sig Start Date End Date Taking? Authorizing Provider  amoxicillin-clavulanate (AUGMENTIN) 875-125 MG tablet Take 1 tablet by mouth 2 (two) times daily. 06/07/17   Azzie Glatter, FNP  CREON 12000 units CPEP capsule Take 1 capsule (12,000 Units total) by mouth 3 (three) times daily with meals. 06/03/17   Azzie Glatter, FNP  gabapentin (NEURONTIN) 300 MG capsule Take 1 capsule (300 mg total) by mouth 3 (three) times daily. 06/03/17   Azzie Glatter, FNP  LORazepam (ATIVAN) 0.5 MG tablet Take 1 tablet (0.5 mg total) by mouth 2 (two) times daily as needed for anxiety. Patient not taking: Reported on 06/03/2017 04/11/17   Bonnielee Haff, MD  losartan (COZAAR) 50 MG tablet Take 1 tablet (50 mg total) by mouth daily. 06/03/17   Azzie Glatter, FNP  mometasone-formoterol (DULERA) 200-5 MCG/ACT AERO Inhale 2 puffs into the lungs 2 (two) times daily. 03/15/17   Scot Jun, FNP  nicotine (NICODERM CQ - DOSED IN MG/24 HOURS) 21 mg/24hr patch Place 1 patch (21 mg total) onto the skin daily. 06/03/17   Azzie Glatter, FNP  ondansetron (ZOFRAN) 4 MG tablet Take 1 tablet (4 mg total) by mouth 3 (three) times daily as needed. 06/03/17   Azzie Glatter, FNP  predniSONE (DELTASONE) 20 MG tablet Take 2 tablets (40 mg total) by mouth daily. 06/11/17   Horris Speros, Jarrett Soho, PA-C  QUEtiapine (SEROQUEL) 50 MG tablet Take 1 tablet (50 mg total) by mouth 2 (two) times daily. 06/03/17   Azzie Glatter, FNP  SUBOXONE 4-1 MG FILM Take 4 mg by mouth. 05/26/17   [provider]  tiotropium (SPIRIVA HANDIHALER) 18 MCG inhalation capsule INHALE 1 CAPSULE VIA HANDIHALER ONCE DAILY 03/15/17   Scot Jun, FNP  traMADol Veatrice Bourbon) 50 MG tablet Take  1-2 tabs bid prn 06/09/17   Garald Balding, MD    Family History Family History  Problem Relation Age of Onset  . CAD Mother   . Alcoholism Father   . COPD Father     Social History Social History   Tobacco Use  . Smoking status: Current Every Day Smoker    Packs/day: 1.00    Years: 30.00    Pack years: 30.00    Types: Cigarettes  . Smokeless tobacco: Never Used  . Tobacco comment: smoking up to 1.5 ppd per husband.   Substance Use Topics  . Alcohol use: Not Currently    Alcohol/week: 0.0 oz  . Drug use: No    Comment: Hx of polysubstance drug abuse     Allergies   Aspirin and Chlorpromazine hcl   Review of Systems Review of Systems  Constitutional: Negative for appetite change, diaphoresis, fatigue, fever and unexpected weight change.  HENT: Negative for mouth sores.   Eyes: Negative for visual disturbance.  Respiratory: Positive for cough, chest tightness, shortness of breath and wheezing.   Cardiovascular: Negative for chest  pain.  Gastrointestinal: Positive for abdominal pain ( epigastric). Negative for constipation, diarrhea, nausea and vomiting.  Endocrine: Negative for polydipsia, polyphagia and polyuria.  Genitourinary: Negative for dysuria, frequency, hematuria and urgency.  Musculoskeletal: Negative for back pain and neck stiffness.  Skin: Negative for rash.  Allergic/Immunologic: Negative for immunocompromised state.  Neurological: Negative for syncope, light-headedness and headaches.  Hematological: Does not bruise/bleed easily.  Psychiatric/Behavioral: Negative for sleep disturbance. The patient is not nervous/anxious.      Physical Exam Updated Vital Signs BP (!) 161/112 (BP Location: Right Arm)   Pulse (!) 132   Resp 18   SpO2 93%   Physical Exam  Constitutional: She appears well-developed and well-nourished. No distress.  Awake, alert, nontoxic appearance Cachectic  HENT:  Head: Normocephalic and atraumatic.  Mouth/Throat: Oropharynx  is clear and moist. No oropharyngeal exudate.  Eyes: Conjunctivae are normal. No scleral icterus.  Neck: Normal range of motion. Neck supple.  Cardiovascular: Regular rhythm and intact distal pulses. Tachycardia present.  Pulses:      Radial pulses are 2+ on the right side, and 2+ on the left side.  Pulmonary/Chest: Accessory muscle usage present. Tachypnea noted. No respiratory distress. She has decreased breath sounds. She has wheezes (expiratory throughout).  Equal chest expansion  Abdominal: Soft. Bowel sounds are normal. She exhibits no mass. There is no hepatosplenomegaly. There is tenderness in the epigastric area. There is no rigidity, no rebound, no guarding and no CVA tenderness.  Patient complains of mild epigastric tenderness on exam.  No rebound or guarding.  No rigidity.  Musculoskeletal: Normal range of motion. She exhibits no edema.  Neurological: She is alert.  Speech is clear and goal oriented Moves extremities without ataxia  Skin: Skin is warm and dry. She is not diaphoretic.  Psychiatric: She has a normal mood and affect.  Nursing note and vitals reviewed.    ED Treatments / Results  Labs (all labs ordered are listed, but only abnormal results are displayed) Labs Reviewed  CBC WITH DIFFERENTIAL/PLATELET - Abnormal; Notable for the following components:      Result Value   WBC 11.1 (*)    RBC 3.85 (*)    Hemoglobin 11.8 (*)    Neutro Abs 8.6 (*)    All other components within normal limits  COMPREHENSIVE METABOLIC PANEL - Abnormal; Notable for the following components:   ALT 12 (*)    All other components within normal limits  LIPASE, BLOOD     Radiology Dg Chest 2 View  Result Date: 06/11/2017 CLINICAL DATA:  Acute onset of shortness of breath. EXAM: CHEST - 2 VIEW COMPARISON:  Chest radiograph performed 03/08/2017 FINDINGS: The lungs are hyperexpanded, with flattening of the hemidiaphragms, compatible with COPD. There is no evidence of pleural effusion or  pneumothorax. The heart is normal in size; the mediastinal contour is within normal limits. No acute osseous abnormalities are seen. IMPRESSION: Findings of COPD; no acute cardiopulmonary process seen. Electronically Signed   By: Garald Balding M.D.   On: 06/11/2017 05:00    Procedures Procedures (including critical care time)  Angiocath insertion Performed by: Jarrett Soho Tashanda Fuhrer  Consent: Verbal consent obtained. Risks and benefits: risks, benefits and alternatives were discussed Time out: Immediately prior to procedure a "time out" was called to verify the correct patient, procedure, equipment, support staff and site/side marked as required.  Preparation: Patient was prepped and draped in the usual sterile fashion.  Vein Location: right brachiocephalic  Ultrasound Guided  Gauge: 20ga  Normal blood  return and flush without difficulty Patient tolerance: Patient tolerated the procedure well with no immediate complications.     Medications Ordered in ED Medications  methylPREDNISolone sodium succinate (SOLU-MEDROL) 125 mg/2 mL injection 125 mg (125 mg Intravenous Given 06/11/17 0348)  albuterol (PROVENTIL) (2.5 MG/3ML) 0.083% nebulizer solution 5 mg (5 mg Nebulization Given 06/11/17 0324)  ipratropium (ATROVENT) nebulizer solution 0.5 mg (0.5 mg Nebulization Given 06/11/17 0324)  ondansetron (ZOFRAN) injection 4 mg (4 mg Intravenous Given 06/11/17 0348)  albuterol (PROVENTIL) (2.5 MG/3ML) 0.083% nebulizer solution 5 mg (5 mg Nebulization Given 06/11/17 0359)  ipratropium (ATROVENT) nebulizer solution 0.5 mg (0.5 mg Nebulization Given 06/11/17 0359)     Initial Impression / Assessment and Plan / ED Course  I have reviewed the triage vital signs and the nursing notes.  Pertinent labs & imaging results that were available during my care of the patient were reviewed by me and considered in my medical decision making (see chart for details).  Clinical Course as of Jun 12 554  Sat Jun 11, 2017  0353 Patient with persistent wheezing after initial albuterol and Atrovent.  Will repeat.   [HM]  0503 Only very faint expiratory wheezes.  Pt reports she is breathing at baseline and declines additional breathing treatments   [HM]    Clinical Course User Index [HM] Lenox Bink, Jarrett Soho, [place]    Patient presents with COPD exacerbation, shortness of breath.  Chest x-ray without evidence of pneumonia.  Per EMS, patient is requiring 3 L of oxygen to maintain her oxygen saturations.  Patient does not wear oxygen at home.  Patient given Solu-Medrol and albuterol nebulizer x2 with significant improvement here.  She reports she is breathing at baseline now.  She is refusing any additional treatments.  Additionally, patient with complaints of abdominal pain.  She has a history of chronic pancreatitis and symptoms appears consistent with this today.  Abdomen is soft without rebound or guarding.  Labs are reassuring.  Her lipase today is normal.  She is tolerating p.o. without difficulty and without any additional vomiting.  She does not appear distressed..  Patient's vital signs have improved significantly throughout her time here.  She is maintaining an SPO2 of 94% on room air and is no longer tachypneic.  Blood pressure has decreased.    BP (!) 152/91   Pulse (S) 94   Resp (S) 18   SpO2 (S) 94%    Final Clinical Impressions(s) / ED Diagnoses   Final diagnoses:  COPD exacerbation ([place])  Chronic abdominal pain  Wheezing    ED Discharge Orders        Ordered    predniSONE (DELTASONE) 20 MG tablet  Daily     06/11/17 0534       Jessly Lebeck, Jarrett Soho, PA-C 06/11/17 0557    Davonna Belling, MD 06/11/17 [phone]

## 2017-06-11 NOTE — ED Provider Notes (Signed)
[facility] DEPT Provider Note   CSN: [id number] Arrival date & time: 06/11/17  2155     History   Chief Complaint Chief Complaint  Patient presents with  . Shortness of Breath    HPI [name] is a 67 y.o. female.  HPI  67 year old female with history of COPD, pancreatitis, substance abuse comes in with chief complaint of shortness of breath.  Patient was just seen yesterday for shortness of breath.  She states that when she got home she started having worsening of her shortness of breath and chest tightness.  Patient has been wheezing.  Patient has a cough but it is dry.  Patient continues to smoke.  Past Medical History:  Diagnosis Date  . Cancer ([place])    renal ca  . COPD (chronic obstructive pulmonary disease) ([place])   . Drug-seeking behavior   . Pancreatitis   . Pancreatitis   . Seizures ([place])   . Substance abuse [facility])     Patient Active Problem List   Diagnosis Date Noted  . Pressure injury of skin 04/06/2017  . Pressure injury of skin of right ischial tuberosity region: Stage 2 04/06/2017  . Acute urinary retention 04/04/2017  . Benzodiazepine dependence ([place])   . AKI (acute kidney injury) ([place]) 04/03/2017  . Hip fracture, right ([place]) 06/02/2016  . Narcotic addiction ([place]) 02/29/2016  . Abdominal pain, chronic, epigastric- due to chronic pancreatitis 02/29/2016  . COPD (chronic obstructive pulmonary disease) ([place]) 02/28/2016  . Cough 09/03/2015  . Abdominal pain 01/03/2015  . Hyponatremia 01/03/2015  . Hypocalcemia 01/03/2015  . Underweight 01/03/2015  . COPD GOLD III with reversible component  07/17/2014  . DTs (delirium tremens) ([place]) 06/02/2013  . Chronic alcoholic pancreatitis ([place]) 06/02/2013  . Lactic acidosis 06/02/2013  . Recurrent acute pancreatitis 03/28/2013  . Alcohol withdrawal ([place]) 03/28/2013  . Pancreatitis 03/09/2013  . Tobacco abuse 03/09/2013  . Alcohol withdrawal syndrome with perceptual  disturbance ([place]) 03/09/2013  . Acute alcoholic pancreatitis 88/50/2774  . Benzodiazepine withdrawal ([place]) 02/09/2013  . Protein-calorie malnutrition, severe ([place]) 02/09/2013  . Hypokalemia 02/27/2011  . Abdominal pain, acute 02/25/2011  . Nausea and vomiting 02/25/2011  . Thrombocytopenia ([place]) 02/25/2011  . COPD with acute exacerbation ([place]) 02/25/2011  . HTN (hypertension), malignant 02/25/2011  . KIDNEY CANCER 10/18/2008  . Anxiety state 10/18/2008  . GERD 10/18/2008  . CIRRHOSIS 10/18/2008  . PANCREATITIS, CHRONIC- atrophic pancreas 10/18/2008  . DYSPNEA 10/18/2008  . GASTRIC ULCER, HX OF 10/18/2008    Past Surgical History:  Procedure Laterality Date  . IR GENERIC HISTORICAL  05/08/2015   IR RADIOLOGIST EVAL & MGMT 05/08/2015 Aletta Edouard, MD [facility] INTERV RAD  . IR GENERIC HISTORICAL  01/08/2014   IR RADIOLOGIST EVAL & MGMT 01/08/2014 Aletta Edouard, MD [facility] INTERV RAD  . KIDNEY SURGERY     removed cancerous lesions  . PARTIAL GASTRECTOMY       OB History   None      Home Medications    Prior to Admission medications   Medication Sig Start Date End Date Taking? Authorizing Provider  amoxicillin-clavulanate (AUGMENTIN) 875-125 MG tablet Take 1 tablet by mouth 2 (two) times daily. 06/07/17   Azzie Glatter, FNP  CREON 12000 units CPEP capsule Take 1 capsule (12,000 Units total) by mouth 3 (three) times daily with meals. 06/03/17   Azzie Glatter, FNP  gabapentin (NEURONTIN) 300 MG capsule Take 1 capsule (300 mg total) by mouth 3 (three) times daily. 06/03/17  Azzie Glatter, FNP  LORazepam (ATIVAN) 0.5 MG tablet Take 1 tablet (0.5 mg total) by mouth 2 (two) times daily as needed for anxiety. Patient not taking: Reported on 06/03/2017 04/11/17   Bonnielee Haff, MD  losartan (COZAAR) 50 MG tablet Take 1 tablet (50 mg total) by mouth daily. 06/03/17   Azzie Glatter, FNP  mometasone-formoterol (DULERA) 200-5 MCG/ACT AERO Inhale 2 puffs into the lungs 2 (two) times  daily. 03/15/17   Scot Jun, FNP  nicotine (NICODERM CQ - DOSED IN MG/24 HOURS) 21 mg/24hr patch Place 1 patch (21 mg total) onto the skin daily. 06/03/17   Azzie Glatter, FNP  ondansetron (ZOFRAN) 4 MG tablet Take 1 tablet (4 mg total) by mouth 3 (three) times daily as needed. 06/03/17   Azzie Glatter, FNP  predniSONE (DELTASONE) 20 MG tablet Take 2 tablets (40 mg total) by mouth daily. 06/11/17   Muthersbaugh, Jarrett Soho, PA-C  QUEtiapine (SEROQUEL) 50 MG tablet Take 1 tablet (50 mg total) by mouth 2 (two) times daily. 06/03/17   Azzie Glatter, FNP  SUBOXONE 4-1 MG FILM Take 4 mg by mouth. 05/26/17   [provider]  tiotropium (SPIRIVA HANDIHALER) 18 MCG inhalation capsule INHALE 1 CAPSULE VIA HANDIHALER ONCE DAILY 03/15/17   Scot Jun, FNP  traMADol Veatrice Bourbon) 50 MG tablet Take 1-2 tabs bid prn 06/09/17   Garald Balding, MD    Family History Family History  Problem Relation Age of Onset  . CAD Mother   . Alcoholism Father   . COPD Father     Social History Social History   Tobacco Use  . Smoking status: Current Every Day Smoker    Packs/day: 1.00    Years: 30.00    Pack years: 30.00    Types: Cigarettes  . Smokeless tobacco: Never Used  . Tobacco comment: smoking up to 1.5 ppd per husband.   Substance Use Topics  . Alcohol use: Not Currently    Alcohol/week: 0.0 oz  . Drug use: No    Comment: Hx of polysubstance drug abuse     Allergies   Aspirin and Chlorpromazine hcl   Review of Systems Review of Systems  Constitutional: Positive for activity change.  Respiratory: Positive for cough and shortness of breath.   Cardiovascular: Positive for chest pain.  Gastrointestinal: Positive for nausea.  Allergic/Immunologic: Negative for immunocompromised state.  Hematological: Does not bruise/bleed easily.     Physical Exam Updated Vital Signs BP (!) 158/99   Pulse 100   Temp 98.6 F (37 C) (Oral)   Resp 18   Ht 5\' 4"  (1.626 m)   Wt 38.6  kg (85 lb)   SpO2 95%   BMI 14.59 kg/m   Physical Exam  Constitutional: She is oriented to person, place, and time. She appears well-developed.  HENT:  Head: Atraumatic.  Eyes: EOM are normal.  Neck: Normal range of motion. Neck supple.  Cardiovascular: Normal rate.  Pulmonary/Chest: Effort normal. She has wheezes.  Abdominal: Soft. Bowel sounds are normal. There is tenderness.  Epigastric tenderness  Musculoskeletal:       Right lower leg: She exhibits no tenderness and no edema.       Left lower leg: She exhibits no tenderness and no edema.  Neurological: She is alert and oriented to person, place, and time.  Skin: Skin is warm and dry.  Nursing note and vitals reviewed.    ED Treatments / Results  Labs (all labs ordered are listed,  but only abnormal results are displayed) Labs Reviewed  CBC WITH DIFFERENTIAL/PLATELET  COMPREHENSIVE METABOLIC PANEL  LIPASE, BLOOD    EKG None  Radiology Dg Chest 2 View  Result Date: 06/11/2017 CLINICAL DATA:  Acute onset of shortness of breath. EXAM: CHEST - 2 VIEW COMPARISON:  Chest radiograph performed 03/08/2017 FINDINGS: The lungs are hyperexpanded, with flattening of the hemidiaphragms, compatible with COPD. There is no evidence of pleural effusion or pneumothorax. The heart is normal in size; the mediastinal contour is within normal limits. No acute osseous abnormalities are seen. IMPRESSION: Findings of COPD; no acute cardiopulmonary process seen. Electronically Signed   By: Garald Balding M.D.   On: 06/11/2017 05:00    Procedures Procedures (including critical care time)  Medications Ordered in ED Medications  albuterol (PROVENTIL,VENTOLIN) solution continuous neb (has no administration in time range)  ondansetron (ZOFRAN-ODT) disintegrating tablet 4 mg (has no administration in time range)  ketorolac (TORADOL) 15 MG/ML injection 30 mg (has no administration in time range)  ipratropium (ATROVENT) nebulizer solution 0.5 mg  (0.5 mg Nebulization Given 06/11/17 2316)  oxyCODONE-acetaminophen (PERCOCET/ROXICET) 5-325 MG per tablet 1 tablet (1 tablet Oral Given 06/11/17 2316)     Initial Impression / Assessment and Plan / ED Course  I have reviewed the triage vital signs and the nursing notes.  Pertinent labs & imaging results that were available during my care of the patient were reviewed by me and considered in my medical decision making (see chart for details).     67 year old female comes in with chief complaint of shortness of breath.  Patient is also complaining of abdominal pain.  She was seen last night.  Labs were reassuring, chest x-ray was clear. Patient has a mild wheeze. We will give her more nebulizer treatment and reassess.    Final Clinical Impressions(s) / ED Diagnoses   Final diagnoses:  COPD exacerbation [facility])    ED Discharge Orders    None       Varney Biles, MD 06/11/17 2332

## 2017-06-12 MED ORDER — PREDNISONE 20 MG PO TABS: 60.0000 mg | ORAL_TABLET | Freq: Once | ORAL | Status: DC

## 2017-06-12 NOTE — Progress Notes (Signed)
Entered room to provide therapy.  Patient yelled at me and said that she did not want any more treatments and wanted to be discharged.  Spoke to MD and informed him of patients request to be discharged and refusal of therapy.

## 2017-06-12 NOTE — ED Provider Notes (Signed)
  Physical Exam  BP (!) 163/92 (BP Location: Left Arm)   Pulse 91   Temp 99.1 F (37.3 C) (Oral)   Resp 19   Ht 5\' 4"  (1.626 m)   Wt 38.6 kg (85 lb)   SpO2 98%   BMI 14.59 kg/m   Physical Exam  ED Course/Procedures     Procedures  MDM  Patient feels much better after breathing treatment.  No further wheezing.  Discharge home.       Davonna Belling, MD 06/12/17 [phone]

## 2017-06-12 NOTE — Discharge Instructions (Signed)
Take your medicines to help with your breathing.

## 2017-06-12 NOTE — ED Notes (Signed)
Patient refused to get blood drawn and to get an IV.

## 2017-06-12 NOTE — ED Notes (Signed)
Patient is calling husband.

## 2017-06-13 ENCOUNTER — Telehealth: Payer: Self-pay

## 2017-06-13 NOTE — Telephone Encounter (Signed)
Jenny Reichmann from [facility] is calling regarding patient having services for a home health aid. Also states that patient needs some OT and PT as well. Also states that patient needs a personal care service through [facility].  [phone]

## 2017-06-14 ENCOUNTER — Telehealth (INDEPENDENT_AMBULATORY_CARE_PROVIDER_SITE_OTHER): Payer: Self-pay | Admitting: Orthopaedic Surgery

## 2017-06-14 NOTE — Telephone Encounter (Signed)
Patient's husband called and scheduled an appointment for [facility] to see Dr. Durward Fortes on 06/20/17 at 11:45.  He states that patient is in a lot of pain and doesn't know if she has enough Tramadol to make it to Monday.

## 2017-06-14 NOTE — Telephone Encounter (Signed)
Spoke with husband Ronalee Belts) and told him to call the "on call Dr" because Dr. Durward Fortes is out of the office and can not refill and meds.

## 2017-06-19 ENCOUNTER — Other Ambulatory Visit: Payer: Self-pay

## 2017-06-19 ENCOUNTER — Encounter (HOSPITAL_COMMUNITY): Payer: Self-pay

## 2017-06-19 ENCOUNTER — Emergency Department (HOSPITAL_COMMUNITY)
Admission: EM | Admit: 2017-06-19 | Discharge: 2017-06-19 | Disposition: A | Discharge status: Home / Self Care | Payer: Medicaid Other | Attending: Emergency Medicine | Admitting: Emergency Medicine

## 2017-06-19 ENCOUNTER — Emergency Department (HOSPITAL_COMMUNITY): Payer: Medicaid Other

## 2017-06-19 DIAGNOSIS — F17210 Nicotine dependence, cigarettes, uncomplicated: Secondary | ICD-10-CM | POA: Insufficient documentation

## 2017-06-19 DIAGNOSIS — I10 Essential (primary) hypertension: Secondary | ICD-10-CM | POA: Diagnosis not present

## 2017-06-19 DIAGNOSIS — R1013 Epigastric pain: Secondary | ICD-10-CM | POA: Diagnosis not present

## 2017-06-19 DIAGNOSIS — J449 Chronic obstructive pulmonary disease, unspecified: Secondary | ICD-10-CM | POA: Insufficient documentation

## 2017-06-19 DIAGNOSIS — R109 Unspecified abdominal pain: Secondary | ICD-10-CM

## 2017-06-19 DIAGNOSIS — G8929 Other chronic pain: Secondary | ICD-10-CM

## 2017-06-19 DIAGNOSIS — Z79899 Other long term (current) drug therapy: Secondary | ICD-10-CM | POA: Insufficient documentation

## 2017-06-19 DIAGNOSIS — Z85528 Personal history of other malignant neoplasm of kidney: Secondary | ICD-10-CM | POA: Diagnosis not present

## 2017-06-19 LAB — COMPREHENSIVE METABOLIC PANEL
ALK PHOS: 89 U/L (ref 38–126)
ALT: 14 U/L (ref 14–54)
AST: 17 U/L (ref 15–41)
Albumin: 4 g/dL (ref 3.5–5.0)
Anion gap: 9 (ref 5–15)
BUN: 25 mg/dL — ABNORMAL HIGH (ref 6–20)
CO2: 25 mmol/L (ref 22–32)
Calcium: 9.9 mg/dL (ref 8.9–10.3)
Chloride: 106 mmol/L (ref 101–111)
Creatinine, Ser: 0.83 mg/dL (ref 0.44–1.00)
Glucose, Bld: 103 mg/dL — ABNORMAL HIGH (ref 65–99)
Potassium: 3.7 mmol/L (ref 3.5–5.1)
SODIUM: 140 mmol/L (ref 135–145)
Total Bilirubin: 0.6 mg/dL (ref 0.3–1.2)
Total Protein: 7.3 g/dL (ref 6.5–8.1)

## 2017-06-19 LAB — CBC WITH DIFFERENTIAL/PLATELET
Basophils Absolute: 0 10*3/uL (ref 0.0–0.1)
Basophils Relative: 0 %
Eosinophils Absolute: 0 10*3/uL (ref 0.0–0.7)
Eosinophils Relative: 0 %
HEMATOCRIT: 43.1 % (ref 36.0–46.0)
Hemoglobin: 14.6 g/dL (ref 12.0–15.0)
LYMPHS ABS: 1.7 10*3/uL (ref 0.7–4.0)
LYMPHS PCT: 16 %
MCH: 30.9 pg (ref 26.0–34.0)
MCHC: 33.9 g/dL (ref 30.0–36.0)
MCV: 91.1 fL (ref 78.0–100.0)
Monocytes Absolute: 0.7 10*3/uL (ref 0.1–1.0)
Monocytes Relative: 6 %
NEUTROS ABS: 8 10*3/uL — AB (ref 1.7–7.7)
Neutrophils Relative %: 78 %
Platelets: 327 10*3/uL (ref 150–400)
RBC: 4.73 MIL/uL (ref 3.87–5.11)
RDW: 14.2 % (ref 11.5–15.5)
WBC: 10.5 10*3/uL (ref 4.0–10.5)

## 2017-06-19 LAB — LIPASE, BLOOD: Lipase: 37 U/L (ref 11–51)

## 2017-06-19 MED ORDER — ONDANSETRON HCL 4 MG/2ML IJ SOLN
4.0000 mg | Freq: Once | INTRAMUSCULAR | Status: AC
  Administered 2017-06-19: 4 mg via INTRAVENOUS
  Filled 2017-06-19: qty 2

## 2017-06-19 MED ORDER — SODIUM CHLORIDE 0.9 % IV SOLN
INTRAVENOUS | Status: DC
  Administered 2017-06-19: 14:00:00 via INTRAVENOUS

## 2017-06-19 MED ORDER — HALOPERIDOL LACTATE 5 MG/ML IJ SOLN
1.0000 mg | Freq: Once | INTRAMUSCULAR | Status: AC
  Administered 2017-06-19: 1 mg via INTRAVENOUS
  Filled 2017-06-19: qty 1

## 2017-06-19 MED ORDER — LORAZEPAM 2 MG/ML IJ SOLN: 0.5000 mg | Freq: Once | INTRAMUSCULAR | Status: DC

## 2017-06-19 MED ORDER — SODIUM CHLORIDE 0.9 % IV BOLUS
1000.0000 mL | Freq: Once | INTRAVENOUS | Status: AC
  Administered 2017-06-19: 1000 mL via INTRAVENOUS

## 2017-06-19 NOTE — ED Notes (Signed)
Bed: WA10 Expected date: 06/19/17 Expected time: 1:14 PM Means of arrival: Ambulance Comments: abd pain, slightly tachy

## 2017-06-19 NOTE — ED Triage Notes (Signed)
Generalized abd pain x 2 days, present in all quadrants. Hx of pancreatitis. Pt denies ETOH , and states she has been compliant with meds. 133/90 HR 101 98% RA 20RR CBG 94.

## 2017-06-19 NOTE — ED Provider Notes (Signed)
[place] [facility] DEPT Provider Note   CSN: [id number] Arrival date & time: 06/19/17  1323     History   Chief Complaint Chief Complaint  Patient presents with  . Abdominal Pain    HPI [name] is a 67 y.o. female.  67 year old female with history of chronic abdominal pain secondary to pancreatitis as well as drug-seeking behavior presents with exacerbation of her chronic abdominal pain x4 days.  Pain is characterizes constant and midepigastric without radiation to her back.  She had nonbilious emesis.  Denies any blood in stools.  No fever or chills.  Pain is responsive to her usual home regimen.  Denies any urinary symptoms.  Denies any cough or congestion.  Symptoms persistent makes them better.     Past Medical History:  Diagnosis Date  . Cancer ([place])    renal ca  . COPD (chronic obstructive pulmonary disease) ([place])   . Drug-seeking behavior   . Pancreatitis   . Pancreatitis   . Seizures ([place])   . Substance abuse [facility])     Patient Active Problem List   Diagnosis Date Noted  . Pressure injury of skin 04/06/2017  . Pressure injury of skin of right ischial tuberosity region: Stage 2 04/06/2017  . Acute urinary retention 04/04/2017  . Benzodiazepine dependence ([place])   . AKI (acute kidney injury) ([place]) 04/03/2017  . Hip fracture, right ([place]) 06/02/2016  . Narcotic addiction ([place]) 02/29/2016  . Abdominal pain, chronic, epigastric- due to chronic pancreatitis 02/29/2016  . COPD (chronic obstructive pulmonary disease) ([place]) 02/28/2016  . Cough 09/03/2015  . Abdominal pain 01/03/2015  . Hyponatremia 01/03/2015  . Hypocalcemia 01/03/2015  . Underweight 01/03/2015  . COPD GOLD III with reversible component  07/17/2014  . DTs (delirium tremens) ([place]) 06/02/2013  . Chronic alcoholic pancreatitis ([place]) 06/02/2013  . Lactic acidosis 06/02/2013  . Recurrent acute pancreatitis 03/28/2013  . Alcohol withdrawal ([place]) 03/28/2013  . Pancreatitis  03/09/2013  . Tobacco abuse 03/09/2013  . Alcohol withdrawal syndrome with perceptual disturbance ([place]) 03/09/2013  . Acute alcoholic pancreatitis 12/45/8099  . Benzodiazepine withdrawal ([place]) 02/09/2013  . Protein-calorie malnutrition, severe ([place]) 02/09/2013  . Hypokalemia 02/27/2011  . Abdominal pain, acute 02/25/2011  . Nausea and vomiting 02/25/2011  . Thrombocytopenia ([place]) 02/25/2011  . COPD with acute exacerbation ([place]) 02/25/2011  . HTN (hypertension), malignant 02/25/2011  . KIDNEY CANCER 10/18/2008  . Anxiety state 10/18/2008  . GERD 10/18/2008  . CIRRHOSIS 10/18/2008  . PANCREATITIS, CHRONIC- atrophic pancreas 10/18/2008  . DYSPNEA 10/18/2008  . GASTRIC ULCER, HX OF 10/18/2008    Past Surgical History:  Procedure Laterality Date  . IR GENERIC HISTORICAL  05/08/2015   IR RADIOLOGIST EVAL & MGMT 05/08/2015 Aletta Edouard, MD [facility] INTERV RAD  . IR GENERIC HISTORICAL  01/08/2014   IR RADIOLOGIST EVAL & MGMT 01/08/2014 Aletta Edouard, MD [facility] INTERV RAD  . KIDNEY SURGERY     removed cancerous lesions  . PARTIAL GASTRECTOMY       OB History   None      Home Medications    Prior to Admission medications   Medication Sig Start Date End Date Taking? Authorizing Provider  amoxicillin-clavulanate (AUGMENTIN) 875-125 MG tablet Take 1 tablet by mouth 2 (two) times daily. 06/07/17   Azzie Glatter, FNP  CREON 12000 units CPEP capsule Take 1 capsule (12,000 Units total) by mouth 3 (three) times daily with meals. 06/03/17   Azzie Glatter, FNP  gabapentin (NEURONTIN) 300 MG capsule Take 1  capsule (300 mg total) by mouth 3 (three) times daily. 06/03/17   Azzie Glatter, FNP  LORazepam (ATIVAN) 0.5 MG tablet Take 1 tablet (0.5 mg total) by mouth 2 (two) times daily as needed for anxiety. Patient not taking: Reported on 06/03/2017 04/11/17   Bonnielee Haff, MD  losartan (COZAAR) 50 MG tablet Take 1 tablet (50 mg total) by mouth daily. 06/03/17   Azzie Glatter, FNP    mometasone-formoterol (DULERA) 200-5 MCG/ACT AERO Inhale 2 puffs into the lungs 2 (two) times daily. 03/15/17   Scot Jun, FNP  nicotine (NICODERM CQ - DOSED IN MG/24 HOURS) 21 mg/24hr patch Place 1 patch (21 mg total) onto the skin daily. 06/03/17   Azzie Glatter, FNP  ondansetron (ZOFRAN) 4 MG tablet Take 1 tablet (4 mg total) by mouth 3 (three) times daily as needed. 06/03/17   Azzie Glatter, FNP  predniSONE (DELTASONE) 20 MG tablet Take 2 tablets (40 mg total) by mouth daily. 06/11/17   Muthersbaugh, Jarrett Soho, PA-C  QUEtiapine (SEROQUEL) 50 MG tablet Take 1 tablet (50 mg total) by mouth 2 (two) times daily. 06/03/17   Azzie Glatter, FNP  SUBOXONE 4-1 MG FILM Take 4 mg by mouth. 05/26/17   [provider]  tiotropium (SPIRIVA HANDIHALER) 18 MCG inhalation capsule INHALE 1 CAPSULE VIA HANDIHALER ONCE DAILY 03/15/17   Scot Jun, FNP  traMADol Veatrice Bourbon) 50 MG tablet Take 1-2 tabs bid prn 06/09/17   Garald Balding, MD    Family History Family History  Problem Relation Age of Onset  . CAD Mother   . Alcoholism Father   . COPD Father     Social History Social History   Tobacco Use  . Smoking status: Current Every Day Smoker    Packs/day: 1.00    Years: 30.00    Pack years: 30.00    Types: Cigarettes  . Smokeless tobacco: Never Used  . Tobacco comment: smoking up to 1.5 ppd per husband.   Substance Use Topics  . Alcohol use: Not Currently    Alcohol/week: 0.0 oz  . Drug use: No    Comment: Hx of polysubstance drug abuse     Allergies   Aspirin and Chlorpromazine hcl   Review of Systems Review of Systems  All other systems reviewed and are negative.    Physical Exam Updated Vital Signs BP (!) 156/122 (BP Location: Left Arm)   Pulse 84   Temp 98.3 F (36.8 C) (Oral)   Resp 20   Ht 1.626 m (5\' 4" )   Wt 38.6 kg (85 lb)   SpO2 98%   BMI 14.59 kg/m   Physical Exam  Constitutional: She is oriented to person, place, and time. She  appears cachectic.  Non-toxic appearance. No distress.  HENT:  Head: Normocephalic and atraumatic.  Eyes: Pupils are equal, round, and reactive to light. Conjunctivae, EOM and lids are normal.  Neck: Normal range of motion. Neck supple. No tracheal deviation present. No thyroid mass present.  Cardiovascular: Normal rate, regular rhythm and normal heart sounds. Exam reveals no gallop.  No murmur heard. Pulmonary/Chest: Effort normal and breath sounds normal. No stridor. No respiratory distress. She has no decreased breath sounds. She has no wheezes. She has no rhonchi. She has no rales.  Abdominal: Soft. Normal appearance and bowel sounds are normal. She exhibits no distension. There is tenderness in the epigastric area. There is no rebound and no CVA tenderness.    Musculoskeletal: Normal range of motion.  She exhibits no edema or tenderness.  Neurological: She is alert and oriented to person, place, and time. She has normal strength. No cranial nerve deficit or sensory deficit. GCS eye subscore is 4. GCS verbal subscore is 5. GCS motor subscore is 6.  Skin: Skin is warm and dry. No abrasion and no rash noted.  Psychiatric: She has a normal mood and affect. Her speech is normal and behavior is normal.  Nursing note and vitals reviewed.    ED Treatments / Results  Labs (all labs ordered are listed, but only abnormal results are displayed) Labs Reviewed  CBC WITH DIFFERENTIAL/PLATELET  COMPREHENSIVE METABOLIC PANEL  LIPASE, BLOOD  URINALYSIS, ROUTINE W REFLEX MICROSCOPIC  RAPID URINE DRUG SCREEN, HOSP PERFORMED    EKG None  Radiology No results found.  Procedures Procedures (including critical care time)  Medications Ordered in ED Medications  0.9 %  sodium chloride infusion (has no administration in time range)  sodium chloride 0.9 % bolus 1,000 mL (has no administration in time range)  ondansetron (ZOFRAN) injection 4 mg (has no administration in time range)  haloperidol  lactate (HALDOL) injection 1 mg (has no administration in time range)     Initial Impression / Assessment and Plan / ED Course  I have reviewed the triage vital signs and the nursing notes.  Pertinent labs & imaging results that were available during my care of the patient were reviewed by me and considered in my medical decision making (see chart for details).     Patient medicated with IV fluids and antiemetics here.  Feels much better.  No evidence of pancreatitis.  Acute abdominal series without free air.  Abdominal exam is benign.  Stable for discharge  Final Clinical Impressions(s) / ED Diagnoses   Final diagnoses:  None    ED Discharge Orders    None       Lacretia Leigh, MD 06/19/17 1536

## 2017-06-20 ENCOUNTER — Ambulatory Visit (INDEPENDENT_AMBULATORY_CARE_PROVIDER_SITE_OTHER): Payer: Self-pay | Admitting: Orthopaedic Surgery

## 2017-06-20 ENCOUNTER — Telehealth: Payer: Self-pay | Admitting: Family Medicine

## 2017-06-20 NOTE — Telephone Encounter (Signed)
Error

## 2017-06-21 ENCOUNTER — Other Ambulatory Visit: Payer: Self-pay

## 2017-06-21 ENCOUNTER — Inpatient Hospital Stay (HOSPITAL_COMMUNITY)
Admission: EM | Admit: 2017-06-21 | Discharge: 2017-06-28 | DRG: 689 | Disposition: A | Discharge status: Home Health | Payer: Medicaid Other | Attending: Internal Medicine | Admitting: Internal Medicine

## 2017-06-21 ENCOUNTER — Encounter (HOSPITAL_COMMUNITY): Payer: Self-pay

## 2017-06-21 ENCOUNTER — Emergency Department (HOSPITAL_COMMUNITY): Payer: Medicaid Other

## 2017-06-21 DIAGNOSIS — R64 Cachexia: Secondary | ICD-10-CM | POA: Diagnosis present

## 2017-06-21 DIAGNOSIS — R338 Other retention of urine: Secondary | ICD-10-CM | POA: Diagnosis present

## 2017-06-21 DIAGNOSIS — I119 Hypertensive heart disease without heart failure: Secondary | ICD-10-CM | POA: Diagnosis present

## 2017-06-21 DIAGNOSIS — J439 Emphysema, unspecified: Secondary | ICD-10-CM | POA: Diagnosis not present

## 2017-06-21 DIAGNOSIS — F17210 Nicotine dependence, cigarettes, uncomplicated: Secondary | ICD-10-CM | POA: Diagnosis present

## 2017-06-21 DIAGNOSIS — F1120 Opioid dependence, uncomplicated: Secondary | ICD-10-CM | POA: Diagnosis present

## 2017-06-21 DIAGNOSIS — F1910 Other psychoactive substance abuse, uncomplicated: Secondary | ICD-10-CM | POA: Diagnosis present

## 2017-06-21 DIAGNOSIS — Z7951 Long term (current) use of inhaled steroids: Secondary | ICD-10-CM

## 2017-06-21 DIAGNOSIS — I10 Essential (primary) hypertension: Secondary | ICD-10-CM | POA: Diagnosis present

## 2017-06-21 DIAGNOSIS — Z9981 Dependence on supplemental oxygen: Secondary | ICD-10-CM

## 2017-06-21 DIAGNOSIS — Z8711 Personal history of peptic ulcer disease: Secondary | ICD-10-CM

## 2017-06-21 DIAGNOSIS — F329 Major depressive disorder, single episode, unspecified: Secondary | ICD-10-CM | POA: Diagnosis present

## 2017-06-21 DIAGNOSIS — N390 Urinary tract infection, site not specified: Secondary | ICD-10-CM | POA: Diagnosis not present

## 2017-06-21 DIAGNOSIS — R627 Adult failure to thrive: Secondary | ICD-10-CM | POA: Diagnosis present

## 2017-06-21 DIAGNOSIS — G894 Chronic pain syndrome: Secondary | ICD-10-CM | POA: Diagnosis present

## 2017-06-21 DIAGNOSIS — N3000 Acute cystitis without hematuria: Secondary | ICD-10-CM

## 2017-06-21 DIAGNOSIS — E43 Unspecified severe protein-calorie malnutrition: Secondary | ICD-10-CM | POA: Diagnosis present

## 2017-06-21 DIAGNOSIS — Z811 Family history of alcohol abuse and dependence: Secondary | ICD-10-CM

## 2017-06-21 DIAGNOSIS — K7460 Unspecified cirrhosis of liver: Secondary | ICD-10-CM | POA: Diagnosis present

## 2017-06-21 DIAGNOSIS — Z7952 Long term (current) use of systemic steroids: Secondary | ICD-10-CM

## 2017-06-21 DIAGNOSIS — H5789 Other specified disorders of eye and adnexa: Secondary | ICD-10-CM | POA: Diagnosis present

## 2017-06-21 DIAGNOSIS — F1010 Alcohol abuse, uncomplicated: Secondary | ICD-10-CM | POA: Diagnosis present

## 2017-06-21 DIAGNOSIS — Z886 Allergy status to analgesic agent status: Secondary | ICD-10-CM

## 2017-06-21 DIAGNOSIS — K7030 Alcoholic cirrhosis of liver without ascites: Secondary | ICD-10-CM | POA: Diagnosis not present

## 2017-06-21 DIAGNOSIS — Z9119 Patient's noncompliance with other medical treatment and regimen: Secondary | ICD-10-CM | POA: Diagnosis not present

## 2017-06-21 DIAGNOSIS — F419 Anxiety disorder, unspecified: Secondary | ICD-10-CM | POA: Diagnosis present

## 2017-06-21 DIAGNOSIS — Z888 Allergy status to other drugs, medicaments and biological substances status: Secondary | ICD-10-CM

## 2017-06-21 DIAGNOSIS — Z903 Acquired absence of stomach [part of]: Secondary | ICD-10-CM

## 2017-06-21 DIAGNOSIS — K860 Alcohol-induced chronic pancreatitis: Secondary | ICD-10-CM | POA: Diagnosis present

## 2017-06-21 DIAGNOSIS — J449 Chronic obstructive pulmonary disease, unspecified: Secondary | ICD-10-CM | POA: Diagnosis present

## 2017-06-21 DIAGNOSIS — Z825 Family history of asthma and other chronic lower respiratory diseases: Secondary | ICD-10-CM

## 2017-06-21 DIAGNOSIS — B9620 Unspecified Escherichia coli [E. coli] as the cause of diseases classified elsewhere: Secondary | ICD-10-CM | POA: Diagnosis present

## 2017-06-21 DIAGNOSIS — E860 Dehydration: Secondary | ICD-10-CM | POA: Diagnosis present

## 2017-06-21 DIAGNOSIS — Z85528 Personal history of other malignant neoplasm of kidney: Secondary | ICD-10-CM

## 2017-06-21 DIAGNOSIS — R000 Tachycardia, unspecified: Secondary | ICD-10-CM | POA: Diagnosis not present

## 2017-06-21 DIAGNOSIS — F1121 Opioid dependence, in remission: Secondary | ICD-10-CM | POA: Diagnosis not present

## 2017-06-21 DIAGNOSIS — Z79899 Other long term (current) drug therapy: Secondary | ICD-10-CM

## 2017-06-21 DIAGNOSIS — F1320 Sedative, hypnotic or anxiolytic dependence, uncomplicated: Secondary | ICD-10-CM | POA: Diagnosis present

## 2017-06-21 DIAGNOSIS — Z765 Malingerer [conscious simulation]: Secondary | ICD-10-CM

## 2017-06-21 LAB — URINALYSIS, ROUTINE W REFLEX MICROSCOPIC
BILIRUBIN URINE: NEGATIVE
Glucose, UA: NEGATIVE mg/dL
KETONES UR: 5 mg/dL — AB
NITRITE: NEGATIVE
PROTEIN: 100 mg/dL — AB
SPECIFIC GRAVITY, URINE: 1.023 (ref 1.005–1.030)
pH: 5 (ref 5.0–8.0)

## 2017-06-21 LAB — CBC WITH DIFFERENTIAL/PLATELET
BASOS PCT: 0 %
Basophils Absolute: 0 10*3/uL (ref 0.0–0.1)
Eosinophils Absolute: 0 10*3/uL (ref 0.0–0.7)
Eosinophils Relative: 0 %
HEMATOCRIT: 47.5 % — AB (ref 36.0–46.0)
HEMOGLOBIN: 16 g/dL — AB (ref 12.0–15.0)
LYMPHS ABS: 1.1 10*3/uL (ref 0.7–4.0)
LYMPHS PCT: 8 %
MCH: 31 pg (ref 26.0–34.0)
MCHC: 33.7 g/dL (ref 30.0–36.0)
MCV: 92.1 fL (ref 78.0–100.0)
MONO ABS: 0.2 10*3/uL (ref 0.1–1.0)
MONOS PCT: 1 %
NEUTROS ABS: 11.2 10*3/uL — AB (ref 1.7–7.7)
NEUTROS PCT: 91 %
Platelets: 320 10*3/uL (ref 150–400)
RBC: 5.16 MIL/uL — ABNORMAL HIGH (ref 3.87–5.11)
RDW: 14.2 % (ref 11.5–15.5)
WBC: 12.4 10*3/uL — ABNORMAL HIGH (ref 4.0–10.5)

## 2017-06-21 LAB — COMPREHENSIVE METABOLIC PANEL
ALBUMIN: 5.1 g/dL — AB (ref 3.5–5.0)
ALT: 16 U/L (ref 14–54)
AST: 18 U/L (ref 15–41)
Alkaline Phosphatase: 116 U/L (ref 38–126)
Anion gap: 11 (ref 5–15)
BUN: 23 mg/dL — ABNORMAL HIGH (ref 6–20)
CALCIUM: 10.9 mg/dL — AB (ref 8.9–10.3)
CHLORIDE: 102 mmol/L (ref 101–111)
CO2: 28 mmol/L (ref 22–32)
Creatinine, Ser: 0.89 mg/dL (ref 0.44–1.00)
GFR calc non Af Amer: >60 mL/min (ref >60)
GLUCOSE: 141 mg/dL — AB (ref 65–99)
POTASSIUM: 3.9 mmol/L (ref 3.5–5.1)
Sodium: 141 mmol/L (ref 135–145)
Total Bilirubin: 0.4 mg/dL (ref 0.3–1.2)
Total Protein: 9.1 g/dL — ABNORMAL HIGH (ref 6.5–8.1)

## 2017-06-21 LAB — LIPASE, BLOOD: Lipase: 33 U/L (ref 11–51)

## 2017-06-21 MED ORDER — TRAMADOL HCL 50 MG PO TABS
50.0000 mg | ORAL_TABLET | Freq: Four times a day (QID) | ORAL | Status: DC | PRN
  Administered 2017-06-21 – 2017-06-24 (×10): 50 mg via ORAL
  Filled 2017-06-21 (×10): qty 1

## 2017-06-21 MED ORDER — SODIUM CHLORIDE 0.9 % IV BOLUS
500.0000 mL | Freq: Once | INTRAVENOUS | Status: AC
  Administered 2017-06-21: 500 mL via INTRAVENOUS

## 2017-06-21 MED ORDER — ONDANSETRON HCL 4 MG/2ML IJ SOLN
4.0000 mg | Freq: Four times a day (QID) | INTRAMUSCULAR | Status: DC | PRN
  Administered 2017-06-22 – 2017-06-23 (×4): 4 mg via INTRAVENOUS
  Filled 2017-06-21 (×4): qty 2

## 2017-06-21 MED ORDER — ONDANSETRON HCL 4 MG PO TABS
4.0000 mg | ORAL_TABLET | Freq: Four times a day (QID) | ORAL | Status: DC | PRN
  Administered 2017-06-23 – 2017-06-27 (×5): 4 mg via ORAL
  Filled 2017-06-21 (×5): qty 1

## 2017-06-21 MED ORDER — POTASSIUM CHLORIDE IN NACL 20-0.9 MEQ/L-% IV SOLN
INTRAVENOUS | Status: AC
  Administered 2017-06-21 – 2017-06-22 (×2): via INTRAVENOUS
  Filled 2017-06-21 (×2): qty 1000

## 2017-06-21 MED ORDER — TIOTROPIUM BROMIDE MONOHYDRATE 18 MCG IN CAPS
18.0000 ug | ORAL_CAPSULE | Freq: Every day | RESPIRATORY_TRACT | Status: DC
  Administered 2017-06-22 – 2017-06-28 (×4): 18 ug via RESPIRATORY_TRACT
  Filled 2017-06-21: qty 5

## 2017-06-21 MED ORDER — SODIUM CHLORIDE 0.9 % IV BOLUS
500.0000 mL | Freq: Once | INTRAVENOUS | Status: AC
  Administered 2017-06-21: 492 mL via INTRAVENOUS

## 2017-06-21 MED ORDER — SODIUM CHLORIDE 0.9 % IV SOLN
1.0000 g | INTRAVENOUS | Status: DC
  Administered 2017-06-22 – 2017-06-23 (×2): 1 g via INTRAVENOUS
  Filled 2017-06-21 (×2): qty 1

## 2017-06-21 MED ORDER — ONDANSETRON HCL 4 MG/2ML IJ SOLN
2.0000 mg | Freq: Once | INTRAMUSCULAR | Status: AC
  Administered 2017-06-21: 2 mg via INTRAVENOUS
  Filled 2017-06-21: qty 2

## 2017-06-21 MED ORDER — SODIUM CHLORIDE 0.9 % IV SOLN
1.0000 g | Freq: Once | INTRAVENOUS | Status: AC
  Administered 2017-06-21: 1 g via INTRAVENOUS
  Filled 2017-06-21: qty 10

## 2017-06-21 MED ORDER — MOMETASONE FURO-FORMOTEROL FUM 200-5 MCG/ACT IN AERO
2.0000 | INHALATION_SPRAY | Freq: Two times a day (BID) | RESPIRATORY_TRACT | Status: DC
  Administered 2017-06-22 – 2017-06-28 (×10): 2 via RESPIRATORY_TRACT
  Filled 2017-06-21: qty 8.8

## 2017-06-21 MED ORDER — SODIUM CHLORIDE 0.9 % IV SOLN: Freq: Once | INTRAVENOUS | Status: DC

## 2017-06-21 MED ORDER — ERYTHROMYCIN 5 MG/GM OP OINT
TOPICAL_OINTMENT | Freq: Three times a day (TID) | OPHTHALMIC | Status: DC
  Administered 2017-06-21 – 2017-06-23 (×5): via OPHTHALMIC
  Administered 2017-06-23: 1 via OPHTHALMIC
  Administered 2017-06-23 – 2017-06-24 (×2): via OPHTHALMIC
  Administered 2017-06-24 (×2): 1 via OPHTHALMIC
  Administered 2017-06-25: 21:00:00 via OPHTHALMIC
  Administered 2017-06-25 (×2): 1 via OPHTHALMIC
  Administered 2017-06-26 – 2017-06-28 (×6): via OPHTHALMIC
  Filled 2017-06-21: qty 1

## 2017-06-21 MED ORDER — ONDANSETRON HCL 4 MG/2ML IJ SOLN
4.0000 mg | Freq: Once | INTRAMUSCULAR | Status: AC
  Administered 2017-06-21: 4 mg via INTRAVENOUS
  Filled 2017-06-21: qty 2

## 2017-06-21 MED ORDER — QUETIAPINE FUMARATE 50 MG PO TABS
50.0000 mg | ORAL_TABLET | Freq: Two times a day (BID) | ORAL | Status: DC
  Administered 2017-06-21 – 2017-06-28 (×14): 50 mg via ORAL
  Filled 2017-06-21 (×14): qty 1

## 2017-06-21 MED ORDER — ENOXAPARIN SODIUM 40 MG/0.4ML ~~LOC~~ SOLN
40.0000 mg | SUBCUTANEOUS | Status: DC
  Administered 2017-06-21: 40 mg via SUBCUTANEOUS
  Filled 2017-06-21: qty 0.4

## 2017-06-21 MED ORDER — BUPRENORPHINE HCL 2 MG SL SUBL
4.0000 mg | SUBLINGUAL_TABLET | Freq: Every day | SUBLINGUAL | Status: DC
  Administered 2017-06-22 – 2017-06-28 (×7): 4 mg via SUBLINGUAL
  Filled 2017-06-21 (×7): qty 2

## 2017-06-21 NOTE — ED Provider Notes (Signed)
[place] DEPT Provider Note   CSN: [id number] Arrival date & time: 06/21/17  1450     History   Chief Complaint No chief complaint on file.   HPI [name] is a 67 y.o. female.  67 year old female with prior history of pancreatitis, COPD, renal cancer presents with reported failure to thrive and weakness.  Patient reports decreased p.o. intake over the last 3 to 4 days.  Of note, she was present in this ED for similar complaint 48 hours prior.  She was discharged home after that evaluation.  Apparently, today she was evaluated at home by [facility] who found her to be likely dehydrated with a reported significant increase and difficulty taking liquids or food at home.  She was sent to the ED for evaluation.  The history is provided by the patient and medical records.  Illness  This is a recurrent problem. The current episode started more than 2 days ago. The problem occurs constantly. The problem has been gradually worsening. Associated symptoms include abdominal pain. Pertinent negatives include no chest pain. Nothing aggravates the symptoms. Nothing relieves the symptoms. She has tried nothing for the symptoms.    Past Medical History:  Diagnosis Date  . Cancer ([place])    renal ca  . COPD (chronic obstructive pulmonary disease) ([place])   . Drug-seeking behavior   . Pancreatitis   . Pancreatitis   . Seizures ([place])   . Substance abuse [facility])     Patient Active Problem List   Diagnosis Date Noted  . Pressure injury of skin 04/06/2017  . Pressure injury of skin of right ischial tuberosity region: Stage 2 04/06/2017  . Acute urinary retention 04/04/2017  . Benzodiazepine dependence ([place])   . AKI (acute kidney injury) ([place]) 04/03/2017  . Hip fracture, right ([place]) 06/02/2016  . Narcotic addiction ([place]) 02/29/2016  . Abdominal pain, chronic, epigastric- due to chronic pancreatitis 02/29/2016  . COPD (chronic obstructive pulmonary disease)  ([place]) 02/28/2016  . Cough 09/03/2015  . Abdominal pain 01/03/2015  . Hyponatremia 01/03/2015  . Hypocalcemia 01/03/2015  . Underweight 01/03/2015  . COPD GOLD III with reversible component  07/17/2014  . DTs (delirium tremens) ([place]) 06/02/2013  . Chronic alcoholic pancreatitis ([place]) 06/02/2013  . Lactic acidosis 06/02/2013  . Recurrent acute pancreatitis 03/28/2013  . Alcohol withdrawal ([place]) 03/28/2013  . Pancreatitis 03/09/2013  . Tobacco abuse 03/09/2013  . Alcohol withdrawal syndrome with perceptual disturbance ([place]) 03/09/2013  . Acute alcoholic pancreatitis 96/78/9381  . Benzodiazepine withdrawal ([place]) 02/09/2013  . Protein-calorie malnutrition, severe ([place]) 02/09/2013  . Hypokalemia 02/27/2011  . Abdominal pain, acute 02/25/2011  . Nausea and vomiting 02/25/2011  . Thrombocytopenia ([place]) 02/25/2011  . COPD with acute exacerbation ([place]) 02/25/2011  . HTN (hypertension), malignant 02/25/2011  . KIDNEY CANCER 10/18/2008  . Anxiety state 10/18/2008  . GERD 10/18/2008  . CIRRHOSIS 10/18/2008  . PANCREATITIS, CHRONIC- atrophic pancreas 10/18/2008  . DYSPNEA 10/18/2008  . GASTRIC ULCER, HX OF 10/18/2008    Past Surgical History:  Procedure Laterality Date  . IR GENERIC HISTORICAL  05/08/2015   IR RADIOLOGIST EVAL & MGMT 05/08/2015 Aletta Edouard, MD [facility] INTERV RAD  . IR GENERIC HISTORICAL  01/08/2014   IR RADIOLOGIST EVAL & MGMT 01/08/2014 Aletta Edouard, MD [facility] INTERV RAD  . KIDNEY SURGERY     removed cancerous lesions  . PARTIAL GASTRECTOMY       OB History   None      Home Medications  Prior to Admission medications   Medication Sig Start Date End Date Taking? Authorizing Provider  amoxicillin-clavulanate (AUGMENTIN) 875-125 MG tablet Take 1 tablet by mouth 2 (two) times daily. 06/07/17   Azzie Glatter, FNP  CREON 12000 units CPEP capsule Take 1 capsule (12,000 Units total) by mouth 3 (three) times daily with meals. 06/03/17   Azzie Glatter, FNP    gabapentin (NEURONTIN) 300 MG capsule Take 1 capsule (300 mg total) by mouth 3 (three) times daily. 06/03/17   Azzie Glatter, FNP  LORazepam (ATIVAN) 0.5 MG tablet Take 1 tablet (0.5 mg total) by mouth 2 (two) times daily as needed for anxiety. Patient not taking: Reported on 06/03/2017 04/11/17   Bonnielee Haff, MD  losartan (COZAAR) 50 MG tablet Take 1 tablet (50 mg total) by mouth daily. 06/03/17   Azzie Glatter, FNP  mometasone-formoterol (DULERA) 200-5 MCG/ACT AERO Inhale 2 puffs into the lungs 2 (two) times daily. 03/15/17   Scot Jun, FNP  nicotine (NICODERM CQ - DOSED IN MG/24 HOURS) 21 mg/24hr patch Place 1 patch (21 mg total) onto the skin daily. 06/03/17   Azzie Glatter, FNP  ondansetron (ZOFRAN) 4 MG tablet Take 1 tablet (4 mg total) by mouth 3 (three) times daily as needed. 06/03/17   Azzie Glatter, FNP  predniSONE (DELTASONE) 20 MG tablet Take 2 tablets (40 mg total) by mouth daily. 06/11/17   Muthersbaugh, Jarrett Soho, PA-C  QUEtiapine (SEROQUEL) 50 MG tablet Take 1 tablet (50 mg total) by mouth 2 (two) times daily. 06/03/17   Azzie Glatter, FNP  SUBOXONE 4-1 MG FILM Take 4 mg by mouth. 05/26/17   [provider]  tiotropium (SPIRIVA HANDIHALER) 18 MCG inhalation capsule INHALE 1 CAPSULE VIA HANDIHALER ONCE DAILY 03/15/17   Scot Jun, FNP  traMADol Veatrice Bourbon) 50 MG tablet Take 1-2 tabs bid prn 06/09/17   Garald Balding, MD    Family History Family History  Problem Relation Age of Onset  . CAD Mother   . Alcoholism Father   . COPD Father     Social History Social History   Tobacco Use  . Smoking status: Current Every Day Smoker    Packs/day: 1.00    Years: 30.00    Pack years: 30.00    Types: Cigarettes  . Smokeless tobacco: Never Used  . Tobacco comment: smoking up to 1.5 ppd per husband.   Substance Use Topics  . Alcohol use: Not Currently    Alcohol/week: 0.0 oz  . Drug use: No    Comment: Hx of polysubstance drug abuse      Allergies   Aspirin and Chlorpromazine hcl   Review of Systems Review of Systems  Constitutional: Positive for fatigue.  Cardiovascular: Negative for chest pain.  Gastrointestinal: Positive for abdominal pain.  All other systems reviewed and are negative.    Physical Exam Updated Vital Signs BP (!) 160/110   Pulse (!) 109   Temp 97.9 F (36.6 C) (Oral)   Resp 17   Ht 5\' 4"  (1.626 m)   Wt 38.6 kg (85 lb)   SpO2 100%   BMI 14.59 kg/m   Physical Exam  Constitutional: She is oriented to person, place, and time. She appears well-developed. No distress.  Frail-appearing Chronically ill in appearance Cachectic    HENT:  Head: Normocephalic and atraumatic.  Mouth/Throat: Oropharynx is clear and moist.  Eyes: Pupils are equal, round, and reactive to light. Conjunctivae and EOM are normal.  Neck: Normal  range of motion. Neck supple.  Cardiovascular: Normal rate, regular rhythm and normal heart sounds.  Pulmonary/Chest: Effort normal and breath sounds normal. No respiratory distress.  Abdominal: Soft. She exhibits no distension. There is no tenderness.  Musculoskeletal: Normal range of motion. She exhibits no edema or deformity.  Neurological: She is alert and oriented to person, place, and time.  Skin: Skin is warm and dry.  Psychiatric: She has a normal mood and affect.  Nursing note and vitals reviewed.    ED Treatments / Results  Labs (all labs ordered are listed, but only abnormal results are displayed) Labs Reviewed  URINALYSIS, ROUTINE W REFLEX MICROSCOPIC - Abnormal; Notable for the following components:      Result Value   Color, Urine AMBER (*)    APPearance CLOUDY (*)    Hgb urine dipstick LARGE (*)    Ketones, ur 5 (*)    Protein, ur 100 (*)    Leukocytes, UA SMALL (*)    Bacteria, UA MANY (*)    All other components within normal limits  COMPREHENSIVE METABOLIC PANEL - Abnormal; Notable for the following components:   Glucose, Bld 141 (*)     BUN 23 (*)    Calcium 10.9 (*)    Total Protein 9.1 (*)    Albumin 5.1 (*)    All other components within normal limits  CBC WITH DIFFERENTIAL/PLATELET - Abnormal; Notable for the following components:   WBC 12.4 (*)    RBC 5.16 (*)    Hemoglobin 16.0 (*)    HCT 47.5 (*)    Neutro Abs 11.2 (*)    All other components within normal limits  LIPASE, BLOOD    EKG EKG Interpretation  Date/Time:  Tuesday June 21 2017 15:56:35 EDT Ventricular Rate:  121 PR Interval:    QRS Duration: 68 QT Interval:  304 QTC Calculation: 432 R Axis:   89 Text Interpretation:  Sinus tachycardia LAE, consider biatrial enlargement Borderline right axis deviation Probable left ventricular hypertrophy ST depr, consider ischemia, inferior leads Minimal ST elevation, lateral leads Confirmed by Dene Gentry [phone]) on 06/21/2017 3:58:37 PM   Radiology No results found.  Procedures Procedures (including critical care time)  Medications Ordered in ED Medications  cefTRIAXone (ROCEPHIN) 1 g in sodium chloride 0.9 % 100 mL IVPB (has no administration in time range)  0.9 %  sodium chloride infusion (has no administration in time range)  ondansetron (ZOFRAN) injection 2 mg (has no administration in time range)  sodium chloride 0.9 % bolus 500 mL (0 mLs Intravenous Stopped 06/21/17 1848)  ondansetron (ZOFRAN) injection 4 mg (4 mg Intravenous Given 06/21/17 1743)     Initial Impression / Assessment and Plan / ED Course  I have reviewed the triage vital signs and the nursing notes.  Pertinent labs & imaging results that were available during my care of the patient were reviewed by me and considered in my medical decision making (see chart for details).     Case discussed with the manager of [facility]. (Office number [phone], cell [phone])  She has a long-standing history of noncompliance.  Per the manager's assessment (at the patient's home earlier today) the patient may benefit from a long  term placement into a behavioral health facility (after a qualifying stay).  MDM  Screen complete  Patient is presenting for evaluation of failure to thrive and generalized weakness.  Screening laboratory evaluation suggest UTI may be at least partially the cause for her  symptoms.  Antibiotics initiated in the ED.    Case discussed with the hospitalist service for possible admission.  Final Clinical Impressions(s) / ED Diagnoses   Final diagnoses:  Acute cystitis without hematuria    ED Discharge Orders    None       Valarie Merino, MD 06/21/17 2004

## 2017-06-21 NOTE — ED Triage Notes (Signed)
Per EMS, patient from home, [facility] called reporting patient has had failure to thrive x1 month. Working on admission to [facility]. Foul smelling odor x1 week.

## 2017-06-21 NOTE — H&P (Addendum)
History and Physical    [name]:[id number] DOB: 06/12/1950 DOA: 06/21/2017  PCP: Azzie Glatter, FNP   Patient coming from: Home  Chief Complaint: Abdominal Pain, weakness, failure to thrive, dysuria  HPI: [name] is a 67 y.o. female with medical history significant for chronic alcoholic pancreatitis, renal cancer , gastric ulcer, COPD GOLD stage III, HTN, alcohol withdrawal and benzodiazepine dependence, drug-seeking behavior, was brought to the ED via EMS.  Patient reports abdominal pain of about a week duration.  Denies vomiting or nausea.  Reports loose stools 3 times daily of 3 days duration, nonbloody.  Patient also reports burning with urination of 3 days duration. She denies fever or chills.  Patient reports chronic unchanged shortness of breath, on home O2 as needed, ?amount.  She denies cough, no chest pain.  Patient presented to the ED-2 days ago, 06/19/16, for abdominal pain.  Acute abdominal and chest x-ray negative for acute abnormality.  Labs including WBC and lipase are unremarkable.  Patient was given IV fluids and antiemetics and discharged home.  With improvement in patient's symptoms she was sent home.  Patient reports since ED visits she has not eaten anything, abdominal pain has persisted.  5th ED visit this month. 12th ED visits this year, with one hospital admission. Patient is being evaluated by [facility] for admission/placement in their facility.  Patient was seen today by [facility] health nurse-felt patient was dehydrated, not eating or drinking, with a foul odour, so sent patient to the ED.   ED Course: Initial tachycardia 126>>109, respiratory rate 29 >>17, blood pressure systolic 948 improved to 546 after 521mL bolus.  Acute abdominal x-ray pending.  UA-small leukocytes, many bacteria and large hemoglobin, lipase 33, WBC 12.4, hemoglobin increased 16, increased albumin and total protein, ca -10.9.  Patient was given 500 new bolus of IV ceftriaxone 1  g.  Hospitalist was called for failure to thrive and UTI.  Review of Systems: As per HPI otherwise 10 point review of systems negative.   Past Medical History:  Diagnosis Date  . Cancer ([place])    renal ca  . COPD (chronic obstructive pulmonary disease) ([place])   . Drug-seeking behavior   . Pancreatitis   . Pancreatitis   . Seizures ([place])   . Substance abuse [facility]     Past Surgical History:  Procedure Laterality Date  . IR GENERIC HISTORICAL  05/08/2015   IR RADIOLOGIST EVAL & MGMT 05/08/2015 Aletta Edouard, MD [facility] INTERV RAD  . IR GENERIC HISTORICAL  01/08/2014   IR RADIOLOGIST EVAL & MGMT 01/08/2014 Aletta Edouard, MD [facility] INTERV RAD  . KIDNEY SURGERY     removed cancerous lesions  . PARTIAL GASTRECTOMY      reports that she has been smoking cigarettes.  She has a 30.00 pack-year smoking history. She has never used smokeless tobacco. She reports that she drank alcohol. She reports that she does not use drugs.  Allergies  Allergen Reactions  . Aspirin Other (See Comments)    Break out in welts  . Chlorpromazine Hcl Other (See Comments)    Muscle spasms    Family History  Problem Relation Age of Onset  . CAD Mother   . Alcoholism Father   . COPD Father     Prior to Admission medications   Medication Sig Start Date End Date Taking? Authorizing Provider  amoxicillin-clavulanate (AUGMENTIN) 875-125 MG tablet Take 1 tablet by mouth 2 (two) times daily. 06/07/17   Azzie Glatter, FNP  CREON  12000 units CPEP capsule Take 1 capsule (12,000 Units total) by mouth 3 (three) times daily with meals. 06/03/17   Azzie Glatter, FNP  gabapentin (NEURONTIN) 300 MG capsule Take 1 capsule (300 mg total) by mouth 3 (three) times daily. 06/03/17   Azzie Glatter, FNP  LORazepam (ATIVAN) 0.5 MG tablet Take 1 tablet (0.5 mg total) by mouth 2 (two) times daily as needed for anxiety. Patient not taking: Reported on 06/03/2017 04/11/17   Bonnielee Haff, MD  losartan (COZAAR) 50 MG tablet Take 1  tablet (50 mg total) by mouth daily. 06/03/17   Azzie Glatter, FNP  mometasone-formoterol (DULERA) 200-5 MCG/ACT AERO Inhale 2 puffs into the lungs 2 (two) times daily. 03/15/17   Scot Jun, FNP  nicotine (NICODERM CQ - DOSED IN MG/24 HOURS) 21 mg/24hr patch Place 1 patch (21 mg total) onto the skin daily. 06/03/17   Azzie Glatter, FNP  ondansetron (ZOFRAN) 4 MG tablet Take 1 tablet (4 mg total) by mouth 3 (three) times daily as needed. 06/03/17   Azzie Glatter, FNP  predniSONE (DELTASONE) 20 MG tablet Take 2 tablets (40 mg total) by mouth daily. 06/11/17   Muthersbaugh, Jarrett Soho, PA-C  QUEtiapine (SEROQUEL) 50 MG tablet Take 1 tablet (50 mg total) by mouth 2 (two) times daily. 06/03/17   Azzie Glatter, FNP  SUBOXONE 4-1 MG FILM Take 4 mg by mouth. 05/26/17   [provider]  tiotropium (SPIRIVA HANDIHALER) 18 MCG inhalation capsule INHALE 1 CAPSULE VIA HANDIHALER ONCE DAILY 03/15/17   Scot Jun, FNP  traMADol Veatrice Bourbon) 50 MG tablet Take 1-2 tabs bid prn 06/09/17   Garald Balding, MD    Physical Exam: Vitals:   06/21/17 1513 06/21/17 1514 06/21/17 1837  BP: 101/86  (!) 160/110  Pulse: (!) 126  (!) 109  Resp: (!) 29  17  Temp: 97.9 F (36.6 C)    TempSrc: Oral    SpO2: 100%  100%  Weight:  38.6 kg (85 lb)   Height:  5\' 4"  (1.626 m)     Constitutional: Cachectic, chronically ill-appearing Vitals:   06/21/17 1513 06/21/17 1514 06/21/17 1837  BP: 101/86  (!) 160/110  Pulse: (!) 126  (!) 109  Resp: (!) 29  17  Temp: 97.9 F (36.6 C)    TempSrc: Oral    SpO2: 100%  100%  Weight:  38.6 kg (85 lb)   Height:  5\' 4"  (1.626 m)    Eyes: PERRL, lids and conjunctivae normal, but with small amount of purulent drainage left eye ENMT: Mucous membranes are moist. Posterior pharynx clear of any exudate or lesions. Neck: normal, supple, no masses, no thyromegaly Respiratory: clear to auscultation bilaterally, no wheezing, no crackles. Normal respiratory effort.  No accessory muscle use.  Cardiovascular: Tachycardia but regular rate and rhythm, no murmurs / rubs / gallops. No extremity edema. 2+ pedal pulses.  Abdomen: Mild to moderate abdominal tenderness mostly epigastric and right upper quadrant, no masses palpated. No hepatosplenomegaly. Bowel sounds reduced but present.  Musculoskeletal: no clubbing / cyanosis. No joint deformity upper and lower extremities, but patient winces with any movement of extremities reports pain all over. Good ROM, no contractures. Normal muscle tone.  Skin: no rashes, lesions, ulcers. No induration Neurologic: CN 2-12 grossly intact. Strength 5/5 in all 4.  Psychiatric: Normal judgment and insight. Alert and oriented x 3. Normal mood.   Labs on Admission: I have personally reviewed following labs and imaging studies  CBC:  Recent Labs  Lab 06/19/17 1344 06/21/17 1746  WBC 10.5 12.4*  NEUTROABS 8.0* 11.2*  HGB 14.6 16.0*  HCT 43.1 47.5*  MCV 91.1 92.1  PLT 327 233   Basic Metabolic Panel: Recent Labs  Lab 06/19/17 1344 06/21/17 1746  NA 140 141  K 3.7 3.9  CL 106 102  CO2 25 28  GLUCOSE 103* 141*  BUN 25* 23*  CREATININE 0.83 0.89  CALCIUM 9.9 10.9*   Liver Function Tests: Recent Labs  Lab 06/19/17 1344 06/21/17 1746  AST 17 18  ALT 14 16  ALKPHOS 89 116  BILITOT 0.6 0.4  PROT 7.3 9.1*  ALBUMIN 4.0 5.1*   Recent Labs  Lab 06/19/17 1344 06/21/17 1746  LIPASE 37 33   Urine analysis:    Component Value Date/Time   COLORURINE AMBER (A) 06/21/2017 1845   APPEARANCEUR CLOUDY (A) 06/21/2017 1845   LABSPEC 1.023 06/21/2017 1845   PHURINE 5.0 06/21/2017 1845   GLUCOSEU NEGATIVE 06/21/2017 1845   HGBUR LARGE (A) 06/21/2017 1845   BILIRUBINUR NEGATIVE 06/21/2017 1845   BILIRUBINUR negative 06/03/2017 1120   KETONESUR 5 (A) 06/21/2017 1845   PROTEINUR 100 (A) 06/21/2017 1845   UROBILINOGEN 0.2 06/03/2017 1120   UROBILINOGEN 0.2 03/15/2017 1004   NITRITE NEGATIVE 06/21/2017 1845    LEUKOCYTESUR SMALL (A) 06/21/2017 1845    Radiological Exams on Admission: No results found.  EKG: Independently reviewed.  Sinus tachycardia.  ST depressions 2 3 aVF.  QTc 432  Assessment/Plan Principal Problem:   Failure to thrive in adult Active Problems:   Hepatic cirrhosis (HCC)   HTN (hypertension), malignant   Chronic alcoholic pancreatitis ([place])   Benzodiazepine dependence ([place])   Failure to thrive-patient cachectic.  Reduced p.o. Intake.  Reports abdominal pain, diarrhea.  History of drug-seeking behavior benzodiazepine dependence.  Multiple ED visits this year. Working on placement as outpatient. -Physical therapy evaluation, Social Work consult  Abdominal pain, dysuria-abdominal pain x 1 week, likely related to chronic alcoholic pancreatitis.  WBC- 12.4.  Lipase- 33, about baseline. Normal liver enzymes. Abdomen soft.  UA suggestive of UTI.  -Abdominal x-ray ordered in ED pending -Bowel rest clear liquid diet -Zofran -Tramadol for pain -Continue home Suboxone -Continue IV ceftriaxone -Follow-up urine cultures - CBC a.m -Will hold off on further abdominal imaging at this time-pending clinical course  Dehydration -with tachycardia, increased hemoglobin 16, with mildly elevated calcium 10.9.  All reflecting dehydration. Likely secondary to diarrhea, and poor p.o. intake secondary to chronic abdominal pain.  500 mill bolus given in the ED with improvement in tachycardia. -500 ml bolus repeats continue hydration with Ns +20KCL 125cc/hr X 12 hrs -Stool C. difficile  Drug-seeking behavior, hx of benzodiazepine dependence-patient reports she has not taking Ativan in several weeks. Reports in alcohol in 1 year.  Recent hospital admission no alcohol withdrawal but patient was slowly weaned off IV hydromorphone - UDS -Would avoid benzodiazepines, and strong opiates if possible  Left eye discharge-erythromycin ointment topical.  Abnormal EKG-sinus tachycardia ST depressions in  inferior leads.  No chest pain or change in chronic shortness of breath. -Repeat EKG a.m.  DVT prophylaxis: Lovenox Code Status: Full Family Communication: none at bedside Disposition Plan: Per rounding team Consults called: none Admission status: Inpatient telemetry   Bethena Roys MD [facility] Pager [phone] From 6PM-2AM.  Otherwise please contact night-coverage [email or web address] Password [email or web address]  06/21/2017, 8:02 PM

## 2017-06-21 NOTE — ED Notes (Signed)
Bed: WA07 Expected date:  Expected time:  Means of arrival:  Comments: 67 yo failure to thrive, from [facility]

## 2017-06-22 DIAGNOSIS — R627 Adult failure to thrive: Secondary | ICD-10-CM

## 2017-06-22 DIAGNOSIS — F1320 Sedative, hypnotic or anxiolytic dependence, uncomplicated: Secondary | ICD-10-CM

## 2017-06-22 DIAGNOSIS — K860 Alcohol-induced chronic pancreatitis: Secondary | ICD-10-CM

## 2017-06-22 DIAGNOSIS — I10 Essential (primary) hypertension: Secondary | ICD-10-CM

## 2017-06-22 DIAGNOSIS — N3000 Acute cystitis without hematuria: Principal | ICD-10-CM

## 2017-06-22 LAB — RAPID URINE DRUG SCREEN, HOSP PERFORMED
Amphetamines: NOT DETECTED
Benzodiazepines: POSITIVE — AB
Cocaine: NOT DETECTED
OPIATES: NOT DETECTED
Tetrahydrocannabinol: NOT DETECTED

## 2017-06-22 LAB — CBC
HCT: 39.2 % (ref 36.0–46.0)
HEMOGLOBIN: 13.2 g/dL (ref 12.0–15.0)
MCH: 30.6 pg (ref 26.0–34.0)
MCHC: 33.7 g/dL (ref 30.0–36.0)
MCV: 91 fL (ref 78.0–100.0)
Platelets: 256 10*3/uL (ref 150–400)
RBC: 4.31 MIL/uL (ref 3.87–5.11)
RDW: 14.1 % (ref 11.5–15.5)
WBC: 9.7 10*3/uL (ref 4.0–10.5)

## 2017-06-22 MED ORDER — DEXTROSE IN LACTATED RINGERS 5 % IV SOLN
INTRAVENOUS | Status: DC
  Administered 2017-06-22: 17:00:00 via INTRAVENOUS

## 2017-06-22 MED ORDER — VITAMIN B-1 100 MG PO TABS
100.0000 mg | ORAL_TABLET | Freq: Every day | ORAL | Status: DC
  Administered 2017-06-22 – 2017-06-27 (×6): 100 mg via ORAL
  Filled 2017-06-22 (×7): qty 1

## 2017-06-22 MED ORDER — JUVEN PO PACK
1.0000 | PACK | Freq: Two times a day (BID) | ORAL | Status: DC
  Administered 2017-06-22 – 2017-06-28 (×11): 1 via ORAL
  Filled 2017-06-22 (×13): qty 1

## 2017-06-22 MED ORDER — ENOXAPARIN SODIUM 30 MG/0.3ML ~~LOC~~ SOLN
30.0000 mg | SUBCUTANEOUS | Status: DC
  Administered 2017-06-22: 30 mg via SUBCUTANEOUS
  Filled 2017-06-22: qty 0.3

## 2017-06-22 MED ORDER — BOOST PLUS PO LIQD
237.0000 mL | Freq: Three times a day (TID) | ORAL | Status: DC
  Administered 2017-06-23 (×3): 237 mL via ORAL
  Filled 2017-06-22 (×6): qty 237

## 2017-06-22 MED ORDER — ADULT MULTIVITAMIN W/MINERALS CH
1.0000 | ORAL_TABLET | Freq: Every day | ORAL | Status: DC
  Administered 2017-06-22 – 2017-06-26 (×4): 1 via ORAL
  Filled 2017-06-22 (×7): qty 1

## 2017-06-22 MED ORDER — ORAL CARE MOUTH RINSE
15.0000 mL | Freq: Two times a day (BID) | OROMUCOSAL | Status: DC
  Administered 2017-06-22 – 2017-06-28 (×8): 15 mL via OROMUCOSAL

## 2017-06-22 NOTE — Care Management Note (Signed)
Case Management Note  Patient Details  Name: [name] MRN: [id number] Date of Birth: 08-30-1950  Subjective/Objective: Failure to thrive, Abdominal Pain, weakness, dysuria               Action/Plan: Plan to discharge to SNF   Expected Discharge Date:  (unknown)               Expected Discharge Plan:  Skilled Nursing Facility  In-House Referral:  Clinical Social Work  Discharge planning Services  CM Consult  Post Acute Care Choice:    Choice offered to:  Patient  DME Arranged:    DME Agency:     HH Arranged:    [place] Agency:     Status of Service:  Completed, signed off  If discussed at [employer], dates discussed:    Additional CommentsPurcell Mouton, RN 06/22/2017, 3:36 PM

## 2017-06-22 NOTE — Progress Notes (Addendum)
Patient encouraged through out morning to try to sit on bedside commode to void. She states she has no urge. She reports ongoing issue with getting urine to start since her renal CA dx. Bladder scan shows a volumn of 332 ml. Will page for orders.  Margurite Auerbach, [place]: patient able to void in bedside commode. Voided 300 ml dark amber, foul smelling urine.  Margurite Auerbach, RN

## 2017-06-22 NOTE — Progress Notes (Signed)
Patient had not voided since admit. Said she do not have the urge to void. Bladder scan shows 193 cc of urine.

## 2017-06-22 NOTE — Progress Notes (Signed)
PROGRESS NOTE    [name]  VPX:[id number] DOB: 05-Nov-1950 DOA: 06/21/2017 PCP: Azzie Glatter, FNP    Brief Narrative:  67 year old female who presented abdominal pain, weakness and dysuria.  She does have a significant past medical history of chronic alcoholic proctitis, renal cancer, gastric ulcer, COPD, and hypertension.  Reported abdominal pain for last 7 days, associated with significant poor oral intake, diarrhea and dysuria.  About 10 pound nonintentional weight loss over the last 14 days.  On the initial physical examination her heart rate was 126, respiratory rate 21, blood pressure 462 systolic, moist mucous membranes, lungs clear to auscultation bilaterally, heart S1-S2 present rhythmic, tachycardic, abdomen tender to palpation gastric region, no guarding or rebound.  No lower extremity edema.  Patient was admitted to the hospital with working diagnosis of abdominal pain to rule out urinary tract infection, complicated by severe calorie protein malnutrition.   Assessment & Plan:   Principal Problem:   Failure to thrive in adult Active Problems:   Hepatic cirrhosis (HCC)   HTN (hypertension), malignant   Chronic alcoholic pancreatitis ([place])   Benzodiazepine dependence ([place])   1. Suspected urine infection complicated with urinary retention. Will continue antibiotic therapy with ceftriaxone IV, follow on urine cultures, blood cultures and cell count. Patient has been afebrile. In and out catheterization if urinary retention.   2. Calorie protein malnutrition. Will follow on nutritional recommendation, physical therapy recommendations, will need snf at discharge. Add thiamine.   3. 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 No significant abdominal pain but very poor oral intake, no nausea or vomiting, continue hydration with IV fluids.   4. COPD chronic and stable. No clinical signs of exacerbation, will continue mometasone, and tiotropium.   5. Depression. Continue  quetiapine, no confusion or agitation    DVT prophylaxis: enoxaparin   Code Status: full Family Communication: no family at the bedside  Disposition Plan:  Will need snf when stable and improves oral intake.    Consultants:     Procedures:     Antimicrobials:   ceftriaxone    Subjective: Patient very deconditioned, no nausea or vomiting. Improved abdominal pain, with very difficulty ambulating.   Objective: Vitals:   06/21/17 2143 06/22/17 0528 06/22/17 0857 06/22/17 1307  BP: (!) 152/102 (!) 162/99  (!) 130/93  Pulse: 87 91  69  Resp: 18 18  18   Temp: 98.6 F (37 C) 98 F (36.7 C)  98 F (36.7 C)  TempSrc: Oral Oral  Oral  SpO2: 100% 99% 100% 97%  Weight: 30.8 kg (67 lb 14.4 oz)     Height: 5\' 4"  (1.626 m)       Intake/Output Summary (Last 24 hours) at 06/22/2017 1429 Last data filed at 06/22/2017 1245 Gross per 24 hour  Intake 1950.2 ml  Output 300 ml  Net 1650.2 ml   Filed Weights   06/21/17 1514 06/21/17 2143  Weight: 38.6 kg (85 lb) 30.8 kg (67 lb 14.4 oz)    Examination:   General: deconditioned and ill looking appearing Neurology: Awake and alert, non focal  E ENT: no pallor, no icterus, oral mucosa moist Cardiovascular: No JVD. S1-S2 present, rhythmic, no gallops, rubs, or murmurs. No lower extremity edema. Pulmonary: decreased breath sounds bilaterally, adequate air movement, no wheezing, rhonchi or rales. Gastrointestinal. Abdomen flat, no organomegaly, non tender, no rebound or guarding Skin. No rashes Musculoskeletal: no joint deformities     Data Reviewed: I have personally reviewed following labs and imaging studies  CBC:  Recent Labs  Lab 06/19/17 1344 06/21/17 1746 06/22/17 0437  WBC 10.5 12.4* 9.7  NEUTROABS 8.0* 11.2*  --   HGB 14.6 16.0* 13.2  HCT 43.1 47.5* 39.2  MCV 91.1 92.1 91.0  PLT 327 320 680   Basic Metabolic Panel: Recent Labs  Lab 06/19/17 1344 06/21/17 1746  NA 140 141  K 3.7 3.9  CL 106 102  CO2 25  28  GLUCOSE 103* 141*  BUN 25* 23*  CREATININE 0.83 0.89  CALCIUM 9.9 10.9*   GFR: Estimated Creatinine Clearance: 31 mL/min (by C-G formula based on SCr of 0.89 mg/dL). Liver Function Tests: Recent Labs  Lab 06/19/17 1344 06/21/17 1746  AST 17 18  ALT 14 16  ALKPHOS 89 116  BILITOT 0.6 0.4  PROT 7.3 9.1*  ALBUMIN 4.0 5.1*   Recent Labs  Lab 06/19/17 1344 06/21/17 1746  LIPASE 37 33   No results for input(s): AMMONIA in the last 168 hours. Coagulation Profile: No results for input(s): INR, PROTIME in the last 168 hours. Cardiac Enzymes: No results for input(s): CKTOTAL, CKMB, CKMBINDEX, TROPONINI in the last 168 hours. BNP (last 3 results) No results for input(s): PROBNP in the last 8760 hours. HbA1C: No results for input(s): HGBA1C in the last 72 hours. CBG: No results for input(s): GLUCAP in the last 168 hours. Lipid Profile: No results for input(s): CHOL, HDL, LDLCALC, TRIG, CHOLHDL, LDLDIRECT in the last 72 hours. Thyroid Function Tests: No results for input(s): TSH, T4TOTAL, FREET4, T3FREE, THYROIDAB in the last 72 hours. Anemia Panel: No results for input(s): VITAMINB12, FOLATE, FERRITIN, TIBC, IRON, RETICCTPCT in the last 72 hours.    Radiology Studies: I have reviewed all of the imaging during this hospital visit personally     Scheduled Meds: . buprenorphine  4 mg Sublingual Daily  . enoxaparin (LOVENOX) injection  40 mg Subcutaneous Q24H  . erythromycin   Left Eye Q8H  . lactose free nutrition  237 mL Oral TID WC  . mouth rinse  15 mL Mouth Rinse BID  . mometasone-formoterol  2 puff Inhalation BID  . multivitamin with minerals  1 tablet Oral Daily  . nutrition supplement (JUVEN)  1 packet Oral BID BM  . QUEtiapine  50 mg Oral BID  . tiotropium  18 mcg Inhalation Daily   Continuous Infusions: . cefTRIAXone (ROCEPHIN)  IV       LOS: 1 day        Nesbit Michon Gerome Apley, MD [facility] Pager [phone]

## 2017-06-22 NOTE — NC FL2 (Signed)
[place] MEDICAID FL2 LEVEL OF CARE SCREENING TOOL     IDENTIFICATION  Patient Name: [name] Birthdate: Nov 02, 1950 Sex: female Admission Date (Current Location): 06/21/2017  [facility] and [place] Number:  Herbalist and Address:  [facility],  [place], [place]      Provider Number: [id number]  Attending Physician Name and Address:  Tawni Millers  Relative Name and Phone Number:       Current Level of Care: Hospital Recommended Level of Care: [place] Prior Approval Number:    Date Approved/Denied:   PASRR Number: [id number]  Discharge Plan: SNF    Current Diagnoses: Patient Active Problem List   Diagnosis Date Noted  . Failure to thrive in adult 06/21/2017  . Pressure injury of skin 04/06/2017  . Pressure injury of skin of right ischial tuberosity region: Stage 2 04/06/2017  . Acute urinary retention 04/04/2017  . Benzodiazepine dependence ([place])   . AKI (acute kidney injury) ([place]) 04/03/2017  . Hip fracture, right ([place]) 06/02/2016  . Narcotic addiction ([place]) 02/29/2016  . Abdominal pain, chronic, epigastric- due to chronic pancreatitis 02/29/2016  . COPD (chronic obstructive pulmonary disease) ([place]) 02/28/2016  . Cough 09/03/2015  . Abdominal pain 01/03/2015  . Hyponatremia 01/03/2015  . Hypocalcemia 01/03/2015  . Underweight 01/03/2015  . COPD GOLD III with reversible component  07/17/2014  . DTs (delirium tremens) ([place]) 06/02/2013  . Chronic alcoholic pancreatitis ([place]) 06/02/2013  . Lactic acidosis 06/02/2013  . Recurrent acute pancreatitis 03/28/2013  . Alcohol withdrawal ([place]) 03/28/2013  . Pancreatitis 03/09/2013  . Tobacco abuse 03/09/2013  . Alcohol withdrawal syndrome with perceptual disturbance ([employer]) 03/09/2013  . Acute alcoholic pancreatitis 29/51/8841  . Benzodiazepine withdrawal ([place]) 02/09/2013  . Protein-calorie malnutrition, severe ([place]) 02/09/2013  . Hypokalemia  02/27/2011  . Abdominal pain, acute 02/25/2011  . Nausea and vomiting 02/25/2011  . Thrombocytopenia ([place]) 02/25/2011  . COPD with acute exacerbation ([place]) 02/25/2011  . HTN (hypertension), malignant 02/25/2011  . KIDNEY CANCER 10/18/2008  . Anxiety state 10/18/2008  . GERD 10/18/2008  . Hepatic cirrhosis ([place]) 10/18/2008  . PANCREATITIS, CHRONIC- atrophic pancreas 10/18/2008  . DYSPNEA 10/18/2008  . GASTRIC ULCER, HX OF 10/18/2008    Orientation RESPIRATION BLADDER Height & Weight     Self, Time, Situation, Place  O2(2L) Continent Weight: 67 lb 14.4 oz (30.8 kg) Height:  5\' 4"  (162.6 cm)  BEHAVIORAL SYMPTOMS/MOOD NEUROLOGICAL BOWEL NUTRITION STATUS      Continent Diet(clear liquid diet)  AMBULATORY STATUS COMMUNICATION OF NEEDS Skin   Limited Assist Verbally PU Stage and Appropriate Care(pressure injury stage I heel, foam dressing, pressure injury stage I elbow, foam dressing, pressure injury stage I sacrum, foam dressing)                       Personal Care Assistance Level of Assistance  Bathing, Feeding, Dressing Bathing Assistance: Limited assistance Feeding assistance: Independent Dressing Assistance: Limited assistance     Functional Limitations Info  Sight, Hearing, Speech Sight Info: Adequate(wears glasses) Hearing Info: Adequate Speech Info: Adequate    SPECIAL CARE FACTORS FREQUENCY  PT (By licensed PT)     PT Frequency: 5x              Contractures Contractures Info: Not present    Additional Factors Info  Code Status, Allergies Code Status Info: full code Allergies Info: Aspirin, Chlorpromazine Hcl           Current Medications (  06/22/2017):  This is the current hospital active medication list Current Facility-Administered Medications  Medication Dose Route Frequency Provider Last Rate Last Dose  . buprenorphine (SUBUTEX) SL tablet 4 mg  4 mg Sublingual Daily Emokpae, Ejiroghene E, MD   4 mg at 06/22/17 0940  . cefTRIAXone (ROCEPHIN) 1  g in sodium chloride 0.9 % 100 mL IVPB  1 g Intravenous Q24H Emokpae, Ejiroghene E, MD      . enoxaparin (LOVENOX) injection 40 mg  40 mg Subcutaneous Q24H Emokpae, Ejiroghene E, MD   40 mg at 06/21/17 2259  . erythromycin ophthalmic ointment   Left Eye Q8H Emokpae, Ejiroghene E, MD      . lactose free nutrition (BOOST PLUS) liquid 237 mL  237 mL Oral TID WC Arrien, Jimmy Picket, MD      . MEDLINE mouth rinse  15 mL Mouth Rinse BID Arrien, Jimmy Picket, MD   15 mL at 06/22/17 0941  . mometasone-formoterol (DULERA) 200-5 MCG/ACT inhaler 2 puff  2 puff Inhalation BID Emokpae, Ejiroghene E, MD   2 puff at 06/22/17 0857  . multivitamin with minerals tablet 1 tablet  1 tablet Oral Daily Arrien, Jimmy Picket, MD      . nutrition supplement (JUVEN) (JUVEN) powder packet 1 packet  1 packet Oral BID BM Arrien, Jimmy Picket, MD      . ondansetron [facility]) tablet 4 mg  4 mg Oral Q6H PRN Emokpae, Ejiroghene E, MD       Or  . ondansetron (ZOFRAN) injection 4 mg  4 mg Intravenous Q6H PRN Emokpae, Ejiroghene E, MD   4 mg at 06/22/17 0832  . QUEtiapine (SEROQUEL) tablet 50 mg  50 mg Oral BID Emokpae, Ejiroghene E, MD   50 mg at 06/22/17 0940  . tiotropium (SPIRIVA) inhalation capsule 18 mcg  18 mcg Inhalation Daily Emokpae, Ejiroghene E, MD   18 mcg at 06/22/17 0856  . traMADol (ULTRAM) tablet 50 mg  50 mg Oral Q6H PRN Emokpae, Ejiroghene E, MD   50 mg at 06/22/17 1116     Discharge Medications: Please see discharge summary for a list of discharge medications.  Relevant Imaging Results:  Relevant Lab Results:   Additional Information [id number]  Nila Nephew, LCSW

## 2017-06-22 NOTE — Progress Notes (Signed)
Initial Nutrition Assessment  DOCUMENTATION CODES:   Severe malnutrition in context of chronic illness, Severe malnutrition in context of social or environmental circumstances, Underweight  INTERVENTION:  - Will order Boost Breeze TID, each supplement provides 250 kcal and 9 grams of protein. - Will order Juven BID, each packet provides 80 kcal and 14 grams of amino acids. - Will order daily multivitamin with minerals. - Diet advancement as medically feasible. - Continue to encourage PO intakes.   NUTRITION DIAGNOSIS:   Severe Malnutrition related to chronic illness, social / environmental circumstances(chronic pancreatitis, COPD, drug use and drug-seeking behaviors.) as evidenced by severe fat depletion, severe muscle depletion.  GOAL:   Patient will meet greater than or equal to 90% of their needs  MONITOR:   PO intake, Supplement acceptance, Diet advancement, Weight trends, Labs  REASON FOR ASSESSMENT:   Malnutrition Screening Tool  ASSESSMENT:   67 y.o. female with medical history significant for chronic alcoholic pancreatitis, renal cancer, gastric ulcer, COPD GOLD stage III, HTN, alcohol withdrawal, benzodiazepine dependence, and drug-seeking behavior. She was brought to the ED via EMS with reports of abdominal pain for ~1 week. She denies vomiting or nausea, but reports loose stools 3 times/day x3 days, non-bloody, and burning with urination during this time frame. Patient reports chronic unchanged SOB, on home O2 as needed, She had also presented to the ED on 6/16 for abdominal pain and CXR was negative for acute abnormality. Patient was given IV fluids and anti-emetics and discharged home. Patient reports since ED visit she has not eaten anything, abdominal pain has persisted. This is patient's 5th ED visit this month and 12th ED visit this year; one hospital admission this year.   Patient on CLD since admission and refused breakfast, has had a few sips of water during the  day. She reports that she has been having ongoing, severe abdominal pain and that this greatly affects her ability to eat. She reports that pain became increasingly bad ~1 week ago and that it has been worse in the past 2-3 days and that during those few days she did not eat anything. She greatly enjoys Coke (often drinking several cans/day) but was not drinking them as frequently during those days d/t pain.   Despite severe pain, she did not experience any nausea or any feelings that she was going to vomit/no episodes of vomiting. Patient was last seen by an RD on 4/1. She continues with poor eating habits, often only eating once/day.  Per Dr. Talmadge Coventry note last night: FTT, PT to see (saw pt this afternoon) and social work consulted for outpatient placement, abdominal pain likely 2/2 chronic alcoholic pancreatitis, dehydration, C. Diff panel pending, abnormal EKG.   Medications reviewed; 2 mg IV Zofran x1 yesterday and 4 mg IV Zofran x1 yesterday.  Labs reviewed; BUN: 23 mg/dL, Ca: 10.9 mg/dL.     NUTRITION - FOCUSED PHYSICAL EXAM:    Most Recent Value  Orbital Region  Severe depletion  Upper Arm Region  Severe depletion  Thoracic and Lumbar Region  Severe depletion  Buccal Region  Severe depletion  Temple Region  Severe depletion  Clavicle Bone Region  Severe depletion  Clavicle and Acromion Bone Region  Severe depletion  Scapular Bone Region  Severe depletion  Dorsal Hand  Severe depletion  Patellar Region  Severe depletion  Anterior Thigh Region  Severe depletion  Posterior Calf Region  Severe depletion  Edema (RD Assessment)  None  Hair  Reviewed  Eyes  Reviewed  Mouth  Reviewed  Skin  Reviewed  Nails  Reviewed       Diet Order:   Diet Order           Diet clear liquid Room service appropriate? Yes; Fluid consistency: Thin  Diet effective now          EDUCATION NEEDS:   Not appropriate for education at this time  Skin:  Skin Assessment: Skin Integrity  Issues: Skin Integrity Issues:: Stage I, Stage II Stage I: bilateral heels, bilateral elbows, sacrum Stage II: R IT  Last BM:  PTA/unknown  Height:   Ht Readings from Last 1 Encounters:  06/21/17 5\' 4"  (1.626 m)    Weight:   Wt Readings from Last 1 Encounters:  06/21/17 67 lb 14.4 oz (30.8 kg)    Ideal Body Weight:  54.54 kg  BMI:  Body mass index is 11.66 kg/m.  Estimated Nutritional Needs:   Kcal:  1365-1640 (25-30 kcal/kg IBW)  Protein:  65-76 grams (1.2-1.4 grams/kg IBW)  Fluid:  >/= 1.5 L/day      Jarome Matin, MS, RD, LDN, [facility] Inpatient Clinical Dietitian Pager # [phone] After hours/weekend pager # [phone]

## 2017-06-22 NOTE — Evaluation (Signed)
Physical Therapy Evaluation Patient Details Name: [name] MRN: [id number] DOB: 07/20/1950 Today's Date: 06/22/2017   History of Present Illness  [name] is a 67 y.o. female with medical history significant for chronic alcoholic pancreatitis, renal cancer , gastric ulcer, COPD GOLD stage III, HTN, alcohol withdrawal and benzodiazepine dependence, drug-seeking behavior, was brought to the ED via EMS; admitted 06/21/17 with FTT  Clinical Impression  Pt admitted with above diagnosis. Pt currently with functional limitations due to the deficits listed below (see PT Problem List).  Pt with decr safety, significant deconditioning, multiple recent admissions and will likely benefit from SNF post acute; she is reluctantly agreeable at this time; will follow; Pt will benefit from skilled PT to increase their independence and safety with mobility to allow discharge to the venue listed below.       Follow Up Recommendations SNF    Equipment Recommendations  None recommended by PT    Recommendations for Other Services       Precautions / Restrictions Precautions Precautions: Fall      Mobility  Bed Mobility Overal bed mobility: Needs Assistance Bed Mobility: Supine to Sit;Sit to Supine     Supine to sit: Supervision Sit to supine: Supervision   General bed mobility comments: for safety  Transfers Overall transfer level: Needs assistance   Transfers: Sit to/from Stand Sit to Stand: Min guard         General transfer comment: cues for safety  Ambulation/Gait Ambulation/Gait assistance: Min guard Gait Distance (Feet): 20 Feet Assistive device: Rolling walker (2 wheeled) Gait Pattern/deviations: Step-through pattern;Decreased stride length     General Gait Details: cues for RW safety, assist to maneuver in tighter spaces of room  Stairs            Wheelchair Mobility    Modified Rankin (Stroke Patients Only)       Balance Overall balance  assessment: Needs assistance   Sitting balance-Leahy Scale: Fair     Standing balance support: During functional activity;Bilateral upper extremity supported Standing balance-Leahy Scale: Poor                               Pertinent Vitals/Pain Pain Assessment: Faces Faces Pain Scale: Hurts even more Pain Location: does not specify Pain Descriptors / Indicators: Discomfort Pain Intervention(s): Monitored during session    Home Living Family/patient expects to be discharged to:: Skilled nursing facility Living Arrangements: Spouse/significant other Available Help at Discharge: Family Type of Home: House         Home Equipment: Gilford Rile - 2 wheels;Bedside commode Additional Comments: husband works during day; per pt she is independent when she feel slike it    Prior Function Level of Independence: Needs assistance;Independent with assistive device(s)   Gait / Transfers Assistance Needed: pt reports she stays in bed a lot, transfers to [facility] and gets back in bed, amb iwth RW limited household distances           Hand Dominance        Extremity/Trunk Assessment   Upper Extremity Assessment Upper Extremity Assessment: Generalized weakness    Lower Extremity Assessment Lower Extremity Assessment: Generalized weakness       Communication   Communication: No difficulties  Cognition Arousal/Alertness: Awake/alert Behavior During Therapy: WFL for tasks assessed/performed Overall Cognitive Status: Within Functional Limits for tasks assessed  General Comments      Exercises     Assessment/Plan    PT Assessment Patient needs continued PT services  PT Problem List Decreased strength;Decreased activity tolerance;Decreased balance;Decreased mobility;Decreased safety awareness       PT Treatment Interventions DME instruction;Therapeutic activities;Functional mobility training;Gait  training;Therapeutic exercise;Patient/family education    PT Goals (Current goals can be found in the Care Plan section)  Acute Rehab PT Goals Patient Stated Goal: to not be sick PT Goal Formulation: With patient Time For Goal Achievement: 07/06/17 Potential to Achieve Goals: Good    Frequency Min 2X/week   Barriers to discharge        Co-evaluation               AM-PAC PT "6 Clicks" Daily Activity  Outcome Measure Difficulty turning over in bed (including adjusting bedclothes, sheets and blankets)?: A Little Difficulty moving from lying on back to sitting on the side of the bed? : A Little Difficulty sitting down on and standing up from a chair with arms (e.g., wheelchair, bedside commode, etc,.)?: A Little Help needed moving to and from a bed to chair (including a wheelchair)?: A Little Help needed walking in hospital room?: A Little Help needed climbing 3-5 steps with a railing? : A Lot 6 Click Score: 17    End of Session   Activity Tolerance: Patient limited by fatigue Patient left: in bed;with call bell/phone within reach;with bed alarm set   PT Visit Diagnosis: Difficulty in walking, not elsewhere classified (R26.2);Adult, failure to thrive (R62.7)    Time: 8786-7672 PT Time Calculation (min) (ACUTE ONLY): 18 min   Charges:   PT Evaluation $PT Eval Low Complexity: 1 Low     PT G CodesKenyon Ana, PT Pager: [phone] 06/22/2017   [facility] 06/22/2017, 1:10 PM

## 2017-06-22 NOTE — Clinical Social Work Note (Signed)
Clinical Social Work Assessment  Patient Details  Name: [name] MRN: [id number] Date of Birth: 1950-03-14  Date of referral:  06/22/17               Reason for consult:  Facility Placement                Permission sought to share information with:  Family Supports Permission granted to share information::  Yes, Verbal Permission Granted  Name::     husband Natale Lay::     Relationship::     Contact Information:     Housing/Transportation Living arrangements for the past 2 months:  [place] of Information:  Patient, Medical Team Patient Interpreter Needed:  None Criminal Activity/Legal Involvement Pertinent to Current Situation/Hospitalization:  No - Comment as needed Significant Relationships:  Spouse, Community Support Lives with:  Spouse Do you feel safe going back to the place where you live?  Yes Need for family participation in patient care:  Yes (Comment)(pt asked husband be involved in planning)  Care giving concerns:  Pt admitted from home where she resides with her husband. At baseline reports she walks with walker and needs assistance with ADLs other than eating. Went to rehab at [facility] following hospitalization at [facility] 04/2017- reports "it was a good program." Has been getting [place] with [facility] since returning home she reports. Pt has pancreatitis and COPD. Reports she has a history of alcohol use "but hasn't taken a drink in over a year."   Facilities manager / plan:  CSW consulted to assist with SNF placement. Met with pt at bedside- she was alert and oriented. Reports she is familiar with the process of SNF placement and nature of short term rehab goals due to going to SNF in April of this year. States she is open to returning "if it will help." Advised to consult with husband re: facility preferences- left voicemail. Pt states she is aware of Medicaid requirement of 30+ day stay at SNF and being willing to sign over  income check (pr reports receiving disability income) to facility. Also aware Medicaid covered beds are often limited and may need to seek placement outside of [place]. Agreeable. Completed FL2, made referrals to area facilities, and will follow up with bed offers.   Employment status:  Disabled (Comment on whether or not currently receiving Disability)(receives disability) Insurance information:  Medicaid In [place] PT Recommendations:  [place] / Referral to community resources:  [place]  Patient/Family's Response to care:  Did not remark  Patient/Family's Understanding of and Emotional Response to Diagnosis, Current Treatment, and Prognosis:  Pt interaction with CSW was appropriate but brief and surface-level. Did not discuss specific treatment received and while unguarded responding to questions re: SA hx, was short and seemed anxious in general. Did thank CSW for involvement.   Emotional Assessment Appearance:  Appears stated age Attitude/Demeanor/Rapport:  Engaged Affect (typically observed):  Anxious Orientation:  Oriented to Self, Oriented to Place, Oriented to  Time, Oriented to Situation Alcohol / Substance use:  Alcohol Use(reports "over a year" since last use, benzodiazepine use history- reports having Ativan prescribed but "has not taken in a few weeks") Psych involvement (Current and /or in the community):  No (Comment)  Discharge Needs  Concerns to be addressed:  Substance Abuse Concerns, Discharge Planning Concerns Readmission within the last 30 days:  No Current discharge risk:  Dependent with Mobility, Substance Abuse Barriers to Discharge:  Continued  Medical Work up   [employer], LCSW 06/22/2017, 3:04 PM  [phone]

## 2017-06-22 NOTE — Progress Notes (Signed)
MD and CSW,  Pt's husband asked if you could please call him(Michael) at [phone] for update and CSW for placement.

## 2017-06-23 DIAGNOSIS — K7030 Alcoholic cirrhosis of liver without ascites: Secondary | ICD-10-CM

## 2017-06-23 DIAGNOSIS — E43 Unspecified severe protein-calorie malnutrition: Secondary | ICD-10-CM

## 2017-06-23 LAB — BASIC METABOLIC PANEL
ANION GAP: 6 (ref 5–15)
BUN: 16 mg/dL (ref 6–20)
CALCIUM: 8.8 mg/dL — AB (ref 8.9–10.3)
CO2: 23 mmol/L (ref 22–32)
Chloride: 109 mmol/L (ref 101–111)
Creatinine, Ser: 0.8 mg/dL (ref 0.44–1.00)
GFR calc Af Amer: >60 mL/min (ref >60)
GLUCOSE: 108 mg/dL — AB (ref 65–99)
Potassium: 4 mmol/L (ref 3.5–5.1)
SODIUM: 138 mmol/L (ref 135–145)

## 2017-06-23 MED ORDER — LORAZEPAM 0.5 MG PO TABS
0.5000 mg | ORAL_TABLET | Freq: Four times a day (QID) | ORAL | Status: DC | PRN
  Administered 2017-06-23 – 2017-06-24 (×4): 0.5 mg via ORAL
  Filled 2017-06-23 (×4): qty 1

## 2017-06-23 MED ORDER — NICOTINE 21 MG/24HR TD PT24
21.0000 mg | MEDICATED_PATCH | Freq: Every day | TRANSDERMAL | Status: DC
  Administered 2017-06-23 – 2017-06-28 (×6): 21 mg via TRANSDERMAL
  Filled 2017-06-23 (×6): qty 1

## 2017-06-23 MED ORDER — HEPARIN SODIUM (PORCINE) 5000 UNIT/ML IJ SOLN
5000.0000 [IU] | Freq: Two times a day (BID) | INTRAMUSCULAR | Status: DC
  Administered 2017-06-23 – 2017-06-28 (×10): 5000 [IU] via SUBCUTANEOUS
  Filled 2017-06-23 (×10): qty 1

## 2017-06-23 NOTE — Plan of Care (Signed)
  Problem: Education: Goal: Knowledge of General Education information will improve Outcome: Progressing   Problem: Activity: Goal: Risk for activity intolerance will decrease Outcome: Progressing   Problem: Nutrition: Goal: Adequate nutrition will be maintained Outcome: Progressing   Problem: Elimination: Goal: Will not experience complications related to bowel motility Outcome: Progressing Goal: Will not experience complications related to urinary retention Outcome: Progressing   Problem: Pain Managment: Goal: General experience of comfort will improve Outcome: Progressing   Problem: Skin Integrity: Goal: Risk for impaired skin integrity will decrease Outcome: Progressing

## 2017-06-23 NOTE — Progress Notes (Signed)
PROGRESS NOTE    [name]  CHE:[id number] DOB: [birth year] DOA: 06/21/2017 PCP: Azzie Glatter, FNP    Brief Narrative:  67 year old female who presented abdominal pain, weakness and dysuria.  She does have a significant past medical history of chronic alcoholic proctitis, renal cancer, gastric ulcer, COPD, and hypertension.  Reported abdominal pain for last 7 days, associated with significant poor oral intake, diarrhea and dysuria.  About 10 pound nonintentional weight loss over the last 14 days.  On the initial physical examination her heart rate was 126, respiratory rate 21, blood pressure 536 systolic, moist mucous membranes, lungs clear to auscultation bilaterally, heart S1-S2 present rhythmic, tachycardic, abdomen tender to palpation gastric region, no guarding or rebound.  No lower extremity edema.  Sodium 141, potassium 3.9, chloride 102, bicarb 28, glucose 141, BUN 23, creatinine 0.89, white count 12.4, hemoglobin 16.0, hematocrit 47.5 platelets 320.  Urinalysis specific gravity 1.023, protein 100, white cells 21-50.  Urine drug screen positive for benzodiazepines.  Chest radiograph was negative for infiltrates.  EKG with sinus tachycardia, poor R wave progression, left ventricular hypertrophy.  Patient was admitted to the hospital with working diagnosis of abdominal pain to rule out urinary tract infection, complicated by severe calorie protein malnutrition.   Assessment & Plan:   Principal Problem:   Failure to thrive in adult Active Problems:   Hepatic cirrhosis (HCC)   HTN (hypertension), malignant   Chronic alcoholic pancreatitis ([place])   Benzodiazepine dependence ([place])   1. Urinary tract infection complicated with urinary retention. Continue antibiotic therapy with ceftriaxone, urine culture positive for gram negative rods, follow on further identification. No further need for bladder catheterization, patient has remained afebrile and wbc down to 9,7 from 12,4. Will  hold on further IV fluids.   2. Severe calorie protein malnutrition. Patient placed on boost tid, Juven bid, multivitamins, and continue to encourage po intake. Patient will need snf at discharge. Continue as needed antiemetics.   3. 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 Improved po intake, no significant abdominal pain, no nausea or vomiting.  4. COPD chronic and stable with nicotine abuse. With no clinical signs of exacerbation, continue mometasone, and tiotropium. Will add nicotine patch.   5. Depression. On quetiapine, no confusion or agitation   6. History of alcohol and opioid abuse. Will continue neuro checks per unit protocol, will resume as needed lorazepam. Patient on buprenorphine at home.    DVT prophylaxis: enoxaparin   Code Status: full Family Communication: no family at the bedside  Disposition Plan:  Will need snf when stable and improves oral intake.    Consultants:     Procedures:     Antimicrobials:   ceftriaxone    Subjective: Patient feeling better, improved appetite, has noted episodes of anxiety and not able to sleep. Dysuria still present but has improved in intensity. Has required antiemetics before meals.   Objective: Vitals:   06/22/17 1307 06/22/17 2309 06/23/17 0631 06/23/17 0927  BP: (!) 130/93  (!) 158/98   Pulse: 69  69   Resp: 18  18   Temp: 98 F (36.7 C)  98.7 F (37.1 C)   TempSrc: Oral  Oral   SpO2: 97% 97% 100% 98%  Weight:      Height:        Intake/Output Summary (Last 24 hours) at 06/23/2017 1239 Last data filed at 06/23/2017 0951 Gross per 24 hour  Intake 1264.16 ml  Output 1000 ml  Net 264.16 ml   [employer]  06/21/17 1514 06/21/17 2143  Weight: 38.6 kg (85 lb) 30.8 kg (67 lb 14.4 oz)    Examination:   General: deconditioned and ill looking appearing.  Neurology: Awake and alert, non focal  E ENT: mild pallor, no icterus, oral mucosa moist Cardiovascular: No JVD. S1-S2 present, rhythmic, no  gallops, rubs, or murmurs. No lower extremity edema. Pulmonary: positive breath sounds bilaterally, adequate air movement, no wheezing, rhonchi or rales. Gastrointestinal. Abdomen with no organomegaly, non tender, no rebound or guarding Skin. No rashes Musculoskeletal: no joint deformities     Data Reviewed: I have personally reviewed following labs and imaging studies  CBC: Recent Labs  Lab 06/19/17 1344 06/21/17 1746 06/22/17 0437  WBC 10.5 12.4* 9.7  NEUTROABS 8.0* 11.2*  --   HGB 14.6 16.0* 13.2  HCT 43.1 47.5* 39.2  MCV 91.1 92.1 91.0  PLT 327 320 063   Basic Metabolic Panel: Recent Labs  Lab 06/19/17 1344 06/21/17 1746 06/23/17 0716  NA 140 141 138  K 3.7 3.9 4.0  CL 106 102 109  CO2 25 28 23   GLUCOSE 103* 141* 108*  BUN 25* 23* 16  CREATININE 0.83 0.89 0.80  CALCIUM 9.9 10.9* 8.8*   GFR: Estimated Creatinine Clearance: 34.5 mL/min (by C-G formula based on SCr of 0.8 mg/dL). Liver Function Tests: Recent Labs  Lab 06/19/17 1344 06/21/17 1746  AST 17 18  ALT 14 16  ALKPHOS 89 116  BILITOT 0.6 0.4  PROT 7.3 9.1*  ALBUMIN 4.0 5.1*   Recent Labs  Lab 06/19/17 1344 06/21/17 1746  LIPASE 37 33   No results for input(s): AMMONIA in the last 168 hours. Coagulation Profile: No results for input(s): INR, PROTIME in the last 168 hours. Cardiac Enzymes: No results for input(s): CKTOTAL, CKMB, CKMBINDEX, TROPONINI in the last 168 hours. BNP (last 3 results) No results for input(s): PROBNP in the last 8760 hours. HbA1C: No results for input(s): HGBA1C in the last 72 hours. CBG: No results for input(s): GLUCAP in the last 168 hours. Lipid Profile: No results for input(s): CHOL, HDL, LDLCALC, TRIG, CHOLHDL, LDLDIRECT in the last 72 hours. Thyroid Function Tests: No results for input(s): TSH, T4TOTAL, FREET4, T3FREE, THYROIDAB in the last 72 hours. Anemia Panel: No results for input(s): VITAMINB12, FOLATE, FERRITIN, TIBC, IRON, RETICCTPCT in the last 72  hours.    Radiology Studies: I have reviewed all of the imaging during this hospital visit personally     Scheduled Meds: . buprenorphine  4 mg Sublingual Daily  . erythromycin   Left Eye Q8H  . heparin injection (subcutaneous)  5,000 Units Subcutaneous Q12H  . lactose free nutrition  237 mL Oral TID WC  . mouth rinse  15 mL Mouth Rinse BID  . mometasone-formoterol  2 puff Inhalation BID  . multivitamin with minerals  1 tablet Oral Daily  . nicotine  21 mg Transdermal Daily  . nutrition supplement (JUVEN)  1 packet Oral BID BM  . QUEtiapine  50 mg Oral BID  . thiamine  100 mg Oral Daily  . tiotropium  18 mcg Inhalation Daily   Continuous Infusions: . cefTRIAXone (ROCEPHIN)  IV Stopped (06/22/17 2138)     LOS: 2 days        Braniya Farrugia Gerome Apley, MD [facility] Pager [phone]

## 2017-06-24 DIAGNOSIS — F1121 Opioid dependence, in remission: Secondary | ICD-10-CM

## 2017-06-24 DIAGNOSIS — N390 Urinary tract infection, site not specified: Secondary | ICD-10-CM

## 2017-06-24 LAB — URINE CULTURE: Culture: >=100000 — AB

## 2017-06-24 MED ORDER — TRAMADOL HCL 50 MG PO TABS
50.0000 mg | ORAL_TABLET | Freq: Four times a day (QID) | ORAL | Status: DC | PRN
  Administered 2017-06-24 – 2017-06-26 (×5): 50 mg via ORAL
  Filled 2017-06-24 (×5): qty 1

## 2017-06-24 MED ORDER — ACETAMINOPHEN 325 MG PO TABS
650.0000 mg | ORAL_TABLET | Freq: Four times a day (QID) | ORAL | Status: DC | PRN
  Administered 2017-06-24: 650 mg via ORAL
  Filled 2017-06-24 (×3): qty 2

## 2017-06-24 MED ORDER — BOOST / RESOURCE BREEZE PO LIQD CUSTOM
1.0000 | Freq: Three times a day (TID) | ORAL | Status: DC
  Administered 2017-06-24 – 2017-06-28 (×12): 1 via ORAL

## 2017-06-24 MED ORDER — IBUPROFEN 200 MG PO TABS
400.0000 mg | ORAL_TABLET | Freq: Four times a day (QID) | ORAL | Status: DC | PRN
  Administered 2017-06-24 – 2017-06-26 (×3): 400 mg via ORAL
  Filled 2017-06-24 (×3): qty 2

## 2017-06-24 MED ORDER — LORAZEPAM 0.5 MG PO TABS
0.5000 mg | ORAL_TABLET | ORAL | Status: DC | PRN
  Administered 2017-06-24 – 2017-06-28 (×18): 0.5 mg via ORAL
  Filled 2017-06-24 (×18): qty 1

## 2017-06-24 MED ORDER — AMOXICILLIN 250 MG PO CAPS
500.0000 mg | ORAL_CAPSULE | Freq: Two times a day (BID) | ORAL | Status: DC
  Administered 2017-06-24 – 2017-06-28 (×9): 500 mg via ORAL
  Filled 2017-06-24 (×9): qty 2

## 2017-06-24 NOTE — Progress Notes (Signed)
Physical Therapy Treatment Patient Details Name: [name] MRN: [id number] DOB: 16-Aug-1950 Today's Date: 06/24/2017    History of Present Illness [name] is a 67 y.o. female with medical history significant for chronic alcoholic pancreatitis, renal cancer , gastric ulcer, COPD GOLD stage III, HTN, alcohol withdrawal and benzodiazepine dependence, drug-seeking behavior, was brought to the ED via EMS; admitted 06/21/17 with FTT    PT Comments    Patient self limiting treatment today with c/o too much pain in her legs to stand so performed seated therex at EOB.  Continues to be appropriate for SNF level rehab.  Noted decreased ankle AROM so possibly has pain in calves in standing due to tight heel cords.  Will continue skilled PT in acute setting as tolerated.   Follow Up Recommendations  SNF     Equipment Recommendations  None recommended by PT    Recommendations for Other Services       Precautions / Restrictions Precautions Precautions: Fall    Mobility  Bed Mobility Overal bed mobility: Needs Assistance Bed Mobility: Supine to Sit;Sit to Supine     Supine to sit: Supervision Sit to supine: Supervision      Transfers                 General transfer comment: refused activity up on her feet due to pain in her legs  Ambulation/Gait                 Stairs             Wheelchair Mobility    Modified Rankin (Stroke Patients Only)       Balance Overall balance assessment: Needs assistance   Sitting balance-Leahy Scale: Fair                                      Cognition Arousal/Alertness: Awake/alert Behavior During Therapy: WFL for tasks assessed/performed Overall Cognitive Status: Within Functional Limits for tasks assessed                                        Exercises General Exercises - Upper Extremity Elbow Flexion: Strengthening;10 reps;Other (comment)(holding 4oz juice  cups) General Exercises - Lower Extremity Ankle Circles/Pumps: Seated;20 reps;Both;AROM Long Arc Quad: AROM;10 reps;Both;Seated(3 sec hold) Hip ABduction/ADduction: AROM;10 reps;Both;Seated Hip Flexion/Marching: AROM;10 reps;Both;Seated Other Exercises Other Exercises: seated rows with 4oz juice cups x 10    General Comments        Pertinent Vitals/Pain Faces Pain Scale: Hurts even more Pain Location: bilat lower legs Pain Descriptors / Indicators: Discomfort;Grimacing Pain Intervention(s): Limited activity within patient's tolerance;Monitored during session;Repositioned    Home Living                      Prior Function            PT Goals (current goals can now be found in the care plan section) Progress towards PT goals: Not progressing toward goals - comment(refusing up on her feet today)    Frequency    Min 2X/week      PT Plan Current plan remains appropriate    Co-evaluation              AM-PAC PT "6 Clicks" Daily Activity  Outcome Measure  Difficulty turning over in  bed (including adjusting bedclothes, sheets and blankets)?: A Little Difficulty moving from lying on back to sitting on the side of the bed? : A Little Difficulty sitting down on and standing up from a chair with arms (e.g., wheelchair, bedside commode, etc,.)?: A Little Help needed moving to and from a bed to chair (including a wheelchair)?: Total Help needed walking in hospital room?: Total Help needed climbing 3-5 steps with a railing? : Total 6 Click Score: 12    End of Session   Activity Tolerance: Patient limited by fatigue;Patient limited by pain Patient left: in bed;with call bell/phone within reach   PT Visit Diagnosis: Difficulty in walking, not elsewhere classified (R26.2);Adult, failure to thrive (R62.7)     Time: 9741-6384 PT Time Calculation (min) (ACUTE ONLY): 11 min  Charges:  $Therapeutic Exercise: 8-22 mins                    G CodesMagda Kiel, [place] [phone] 06/24/2017    Reginia Naas 06/24/2017, 3:33 PM

## 2017-06-24 NOTE — Progress Notes (Signed)
CSW following to assist patient with discharge planning to SNF for placement. Patient needs a [facility] term care Medicaid bed at SNF. Patient had one bed offer at [facility]. CSW informed by patient's attending MD that patient will need to be dc on buprenorphine  for pain management.  CSW updated [facility]. SNF staff reported that they are unable to accept patient on buprenorphine.  Patient currently has no bed offers. CSW will continue to seek placement for patient.  Abundio Miu, [place] Social Worker [facility] Cell#: [phone]

## 2017-06-24 NOTE — Progress Notes (Signed)
PROGRESS NOTE    [name]  WUJ:[id number] DOB: 01/13/1950 DOA: 06/21/2017 PCP: Azzie Glatter, FNP    Brief Narrative:  67 year old female who presented abdominal pain, weakness and dysuria. She does have a significant past medical history of chronic alcoholic proctitis, renal cancer, gastric ulcer, COPD, andhypertension.Reported abdominal pain for last 7 days, associated with significant poor oral intake, diarrhea and dysuria. About 10 pound nonintentional weight loss over the last 14days. On the initial physical examination her heart rate was 126, respiratory rate21, blood pressure 956 systolic, moist mucous membranes, lungs clear to auscultation bilaterally, heart S1-S2 present rhythmic, tachycardic, abdomen tender to palpation gastric region, no guarding or rebound. No lower extremity edema.  Sodium 141, potassium 3.9, chloride 102, bicarb 28, glucose 141, BUN 23, creatinine 0.89, white count 12.4, hemoglobin 16.0, hematocrit 47.5 platelets 320.  Urinalysis specific gravity 1.023, protein 100, white cells 21-50.  Urine drug screen positive for benzodiazepines.  Chest radiograph was negative for infiltrates.  EKG with sinus tachycardia, poor R wave progression, left ventricular hypertrophy.  Patient was admitted to the hospital with working diagnosis of abdominal pain to rule out urinary tract infection, complicated by severe calorie protein malnutrition.    Assessment & Plan:   Principal Problem:   Failure to thrive in adult Active Problems:   Hepatic cirrhosis (HCC)   HTN (hypertension), malignant   Chronic alcoholic pancreatitis ([place])   Benzodiazepine dependence ([place])  1. Urinary tract infection complicated with urinary retention due to E. Coli, present on admission. No further nausea or vomiting, E coli pan-sensitive, will transition to oral antibiotic therapy with Amoxicillin with plan to continue to total of 7 days. No further urinary retention. Will discontinue  telemetry monitoring.   2. Severe calorie protein malnutrition. Will continue boost tid, Juven bid, multivitamins. Plan for snf at discharge. Out of bed as tolertaed.   3. 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 continue as needed antiemetics and analgesics, will hold on tramadol and continue with acetaminophen.  4. COPD chronic and stable with nicotine abuse. No clinical signs of exacerbation, on mometasone, plus tiotropium. Tolerating well nicotine patch.   5. Depression. continue with quetiapine, with good toleration.   6. History of alcohol and opioid abuse with chronic pain syndrome. Patient follows with the pain clinic for buprenorphine, today noted with signs of withdrawal, will increase frequency of lorazepam to every four hours as needed and continue close monitoring and neuro checks per unit protocol. Will hold on tramadol, will add ibuprofen and acetaminophen for pain control. Continue thiamine and multivitamins. No confusion or agitation.    DVT prophylaxis:enoxaparin Code Status:full Family Communication:no family at the bedside Disposition Plan:Will need snf when stable and improves oral intake.   Consultants:    Procedures:    Antimicrobials:  ceftriaxone   Subjective: Patient has noted persistent tremors and anxiety, improved with lorazepam, for short period of time. No nausea or vomiting, appetite continue to improve but not back to baseline, continue to have abdominal pain.   Objective: Vitals:   06/23/17 1439 06/23/17 2048 06/24/17 0415 06/24/17 0914  BP: 126/71 135/88 120/84   Pulse: 79 77 72   Resp: 18 19 16    Temp: 97.6 F (36.4 C) 98.7 F (37.1 C) 98.6 F (37 C)   TempSrc: Oral Oral Oral   SpO2: 100% 100% 100% 100%  Weight:      Height:        Intake/Output Summary (Last 24 hours) at 06/24/2017 1333 Last data filed at  06/24/2017 1100 Gross per 24 hour  Intake 1420 ml  Output 800 ml  Net 620 ml   Filed  Weights   06/21/17 1514 06/21/17 2143  Weight: 38.6 kg (85 lb) 30.8 kg (67 lb 14.4 oz)    Examination:   General: deconditioned, positive tremor.  Neurology: Awake and alert, non focal. Mid anxiety and tremors.   E ENT: positive pallor, no icterus, oral mucosa moist Cardiovascular: No JVD. S1-S2 present, rhythmic, no gallops, rubs, or murmurs. No lower extremity edema. Pulmonary: vesicular breath sounds bilaterally, adequate air movement, no wheezing, rhonchi or rales. Gastrointestinal. Abdomen with no organomegaly, non tender, no rebound or guarding Skin. No rashes Musculoskeletal: no joint deformities     Data Reviewed: I have personally reviewed following labs and imaging studies  CBC: Recent Labs  Lab 06/19/17 1344 06/21/17 1746 06/22/17 0437  WBC 10.5 12.4* 9.7  NEUTROABS 8.0* 11.2*  --   HGB 14.6 16.0* 13.2  HCT 43.1 47.5* 39.2  MCV 91.1 92.1 91.0  PLT 327 320 425   Basic Metabolic Panel: Recent Labs  Lab 06/19/17 1344 06/21/17 1746 06/23/17 0716  NA 140 141 138  K 3.7 3.9 4.0  CL 106 102 109  CO2 25 28 23   GLUCOSE 103* 141* 108*  BUN 25* 23* 16  CREATININE 0.83 0.89 0.80  CALCIUM 9.9 10.9* 8.8*   GFR: Estimated Creatinine Clearance: 34.5 mL/min (by C-G formula based on SCr of 0.8 mg/dL). Liver Function Tests: Recent Labs  Lab 06/19/17 1344 06/21/17 1746  AST 17 18  ALT 14 16  ALKPHOS 89 116  BILITOT 0.6 0.4  PROT 7.3 9.1*  ALBUMIN 4.0 5.1*   Recent Labs  Lab 06/19/17 1344 06/21/17 1746  LIPASE 37 33   No results for input(s): AMMONIA in the last 168 hours. Coagulation Profile: No results for input(s): INR, PROTIME in the last 168 hours. Cardiac Enzymes: No results for input(s): CKTOTAL, CKMB, CKMBINDEX, TROPONINI in the last 168 hours. BNP (last 3 results) No results for input(s): PROBNP in the last 8760 hours. HbA1C: No results for input(s): HGBA1C in the last 72 hours. CBG: No results for input(s): GLUCAP in the last 168  hours. Lipid Profile: No results for input(s): CHOL, HDL, LDLCALC, TRIG, CHOLHDL, LDLDIRECT in the last 72 hours. Thyroid Function Tests: No results for input(s): TSH, T4TOTAL, FREET4, T3FREE, THYROIDAB in the last 72 hours. Anemia Panel: No results for input(s): VITAMINB12, FOLATE, FERRITIN, TIBC, IRON, RETICCTPCT in the last 72 hours.    Radiology Studies: I have reviewed all of the imaging during this hospital visit personally     Scheduled Meds: . amoxicillin  500 mg Oral Q12H  . buprenorphine  4 mg Sublingual Daily  . erythromycin   Left Eye Q8H  . feeding supplement  1 Container Oral TID BM  . heparin injection (subcutaneous)  5,000 Units Subcutaneous Q12H  . mouth rinse  15 mL Mouth Rinse BID  . mometasone-formoterol  2 puff Inhalation BID  . multivitamin with minerals  1 tablet Oral Daily  . nicotine  21 mg Transdermal Daily  . nutrition supplement (JUVEN)  1 packet Oral BID BM  . QUEtiapine  50 mg Oral BID  . thiamine  100 mg Oral Daily  . tiotropium  18 mcg Inhalation Daily   Continuous Infusions:   LOS: 3 days        Jaline Pincock Gerome Apley, MD [facility] Pager [phone]

## 2017-06-25 ENCOUNTER — Other Ambulatory Visit: Payer: Self-pay | Admitting: Family Medicine

## 2017-06-25 NOTE — Progress Notes (Signed)
PROGRESS NOTE    [name]  NID:[id number] DOB: 1950-12-22 DOA: 06/21/2017 PCP: Azzie Glatter, FNP    Brief Narrative:  67 year old female who presented abdominal pain, weakness and dysuria. She does have a significant past medical history of chronic alcoholic proctitis, renal cancer, gastric ulcer, COPD, andhypertension.Reported abdominal pain for last 7 days, associated with significant poor oral intake, diarrhea and dysuria. About 10 pound nonintentional weight loss over the last 14days. On the initial physical examination her heart rate was 126, respiratory rate21, blood pressure 144 systolic, moist mucous membranes, lungs clear to auscultation bilaterally, heart S1-S2 present rhythmic, tachycardic, abdomen tender to palpation gastric region, no guarding or rebound. No lower extremity edema.Sodium 141, potassium 3.9, chloride 102, bicarb 28, glucose 141, BUN 23, creatinine 0.89, white count 12.4, hemoglobin 16.0, hematocrit 47.5 platelets 320.Urinalysis specific gravity 1.023, protein 100, white cells 21-50.Urine drug screen positive for benzodiazepines. Chest radiograph was negative for infiltrates. EKG with sinus tachycardia, poor R wave progression, left ventricular hypertrophy.  Patient was admitted to the hospital with working diagnosis of abdominal pain to rule out urinary tract infection, complicated by severe calorie protein malnutrition.     Assessment & Plan:   Principal Problem:   Failure to thrive in adult Active Problems:   Hepatic cirrhosis (HCC)   HTN (hypertension), malignant   Chronic alcoholic pancreatitis ([place])   Benzodiazepine dependence ([place])   1.Urinary tractinfection complicated with urinary retention due to E. Coli, present on admission. Tolerating well Amoxicillin, with no further urinary retention.    2.Severe calorie protein malnutrition. On boost tid, Juven bid, multivitamins. Improved appetite.   3. Chronic pancreatitis  due to alcohol abuse.Planced back on tramadol for pain control will continue with as needed ibuprofen and acetaminophen.  4. COPD chronic and stablewith nicotine abuse.Reamines with no clinical signs of exacerbation, continue with mometasone and tiotropium.On nicotine patch for smoking cessation  5. Depression.On quetiapine. No confusion or agitation.    6. History of alcohol and opioid abuse with chronic pain syndrome. Contjnue buprenorphine and as needed lorazepam. Today with less tremors and anxiety.   DVT prophylaxis:enoxaparin Code Status:full Family Communication:no family at the bedside Disposition Plan:Will need snf when stable and improves oral intake.   Consultants:    Procedures:    Antimicrobials:  Po amoxicilline  Subjective: Patient with continue to improve appetite, continue to have abdominal pain that improves with analgesics, no nausea or vomiting, no dyspnea, chest pain or dysuria.   Objective: Vitals:   06/24/17 0914 06/24/17 1427 06/24/17 2106 06/25/17 0509  BP:  133/77 127/76 104/87  Pulse:  74 85 77  Resp:  12 17 16   Temp:  98 F (36.7 C) 99 F (37.2 C) 98.8 F (37.1 C)  TempSrc:  Oral Oral Oral  SpO2: 100% 100% 100% 99%  Weight:      Height:        Intake/Output Summary (Last 24 hours) at 06/25/2017 1128 Last data filed at 06/25/2017 1000 Gross per 24 hour  Intake 620 ml  Output 1300 ml  Net -680 ml   Filed Weights   06/21/17 1514 06/21/17 2143  Weight: 38.6 kg (85 lb) 30.8 kg (67 lb 14.4 oz)    Examination:   General: deconditioned  Neurology: Awake and alert, non focal  E ENT: mild pallor, no icterus, oral mucosa moist Cardiovascular: No JVD. S1-S2 present, rhythmic, no gallops, rubs, or murmurs. No lower extremity edema. Pulmonary: vesicular breath sounds bilaterally, adequate air movement, no wheezing, rhonchi or rales.  Gastrointestinal. Abdomen with no organomegaly, no rebound or guarding. Tender to  deep palpation.  Skin. No rashes Musculoskeletal: no joint deformities     Data Reviewed: I have personally reviewed following labs and imaging studies  CBC: Recent Labs  Lab 06/19/17 1344 06/21/17 1746 06/22/17 0437  WBC 10.5 12.4* 9.7  NEUTROABS 8.0* 11.2*  --   HGB 14.6 16.0* 13.2  HCT 43.1 47.5* 39.2  MCV 91.1 92.1 91.0  PLT 327 320 950   Basic Metabolic Panel: Recent Labs  Lab 06/19/17 1344 06/21/17 1746 06/23/17 0716  NA 140 141 138  K 3.7 3.9 4.0  CL 106 102 109  CO2 25 28 23   GLUCOSE 103* 141* 108*  BUN 25* 23* 16  CREATININE 0.83 0.89 0.80  CALCIUM 9.9 10.9* 8.8*   GFR: Estimated Creatinine Clearance: 34.5 mL/min (by C-G formula based on SCr of 0.8 mg/dL). Liver Function Tests: Recent Labs  Lab 06/19/17 1344 06/21/17 1746  AST 17 18  ALT 14 16  ALKPHOS 89 116  BILITOT 0.6 0.4  PROT 7.3 9.1*  ALBUMIN 4.0 5.1*   Recent Labs  Lab 06/19/17 1344 06/21/17 1746  LIPASE 37 33   No results for input(s): AMMONIA in the last 168 hours. Coagulation Profile: No results for input(s): INR, PROTIME in the last 168 hours. Cardiac Enzymes: No results for input(s): CKTOTAL, CKMB, CKMBINDEX, TROPONINI in the last 168 hours. BNP (last 3 results) No results for input(s): PROBNP in the last 8760 hours. HbA1C: No results for input(s): HGBA1C in the last 72 hours. CBG: No results for input(s): GLUCAP in the last 168 hours. Lipid Profile: No results for input(s): CHOL, HDL, LDLCALC, TRIG, CHOLHDL, LDLDIRECT in the last 72 hours. Thyroid Function Tests: No results for input(s): TSH, T4TOTAL, FREET4, T3FREE, THYROIDAB in the last 72 hours. Anemia Panel: No results for input(s): VITAMINB12, FOLATE, FERRITIN, TIBC, IRON, RETICCTPCT in the last 72 hours.    Radiology Studies: I have reviewed all of the imaging during this hospital visit personally     Scheduled Meds: . amoxicillin  500 mg Oral Q12H  . buprenorphine  4 mg Sublingual Daily  . erythromycin    Left Eye Q8H  . feeding supplement  1 Container Oral TID BM  . heparin injection (subcutaneous)  5,000 Units Subcutaneous Q12H  . mouth rinse  15 mL Mouth Rinse BID  . mometasone-formoterol  2 puff Inhalation BID  . multivitamin with minerals  1 tablet Oral Daily  . nicotine  21 mg Transdermal Daily  . nutrition supplement (JUVEN)  1 packet Oral BID BM  . QUEtiapine  50 mg Oral BID  . thiamine  100 mg Oral Daily  . tiotropium  18 mcg Inhalation Daily   Continuous Infusions:   LOS: 4 days        Janice Verdi Gerome Apley, MD [facility] Pager [phone]

## 2017-06-26 MED ORDER — TRAMADOL HCL 50 MG PO TABS
25.0000 mg | ORAL_TABLET | ORAL | Status: DC | PRN
  Administered 2017-06-26 – 2017-06-27 (×4): 25 mg via ORAL
  Filled 2017-06-26 (×4): qty 1

## 2017-06-26 NOTE — Progress Notes (Signed)
PROGRESS NOTE    [name]  PNT:[id number] DOB: 06-Jul-1950 DOA: 06/21/2017 PCP: Azzie Glatter, FNP    Brief Narrative:  67 year old female who presented abdominal pain, weakness and dysuria. She does have a significant past medical history of chronic alcoholic proctitis, renal cancer, gastric ulcer, COPD, andhypertension.Reported abdominal pain for last 7 days, associated with significant poor oral intake, diarrhea and dysuria. About 10 pound nonintentional weight loss over the last 14days. On the initial physical examination her heart rate was 126, respiratory rate21, blood pressure 086 systolic, moist mucous membranes, lungs clear to auscultation bilaterally, heart S1-S2 present rhythmic, tachycardic, abdomen tender to palpation gastric region, no guarding or rebound. No lower extremity edema.Sodium 141, potassium 3.9, chloride 102, bicarb 28, glucose 141, BUN 23, creatinine 0.89, white count 12.4, hemoglobin 16.0, hematocrit 47.5 platelets 320.Urinalysis specific gravity 1.023, protein 100, white cells 21-50.Urine drug screen positive for benzodiazepines. Chest radiograph was negative for infiltrates. EKG with sinus tachycardia, poor R wave progression, left ventricular hypertrophy.  Patient was admitted to the hospital with working diagnosis of abdominal pain to rule out urinary tract infection, complicated by severe calorie protein malnutrition.   Assessment & Plan:   Principal Problem:   Failure to thrive in adult Active Problems:   Hepatic cirrhosis (HCC)   HTN (hypertension), malignant   Chronic alcoholic pancreatitis ([place])   Benzodiazepine dependence ([place])   1.Urinary tractinfection complicated with urinary retentiondue to E. Coli, present on admission. Continue Amoxicillin with good toleration   2.Severe calorie protein malnutrition.Continue with boost tid, Juven bid, multivitamins. Improved appetite.   3. Chronic pancreatitis due to  alcohol abuse.Continue tramadol as needed for pain control q 4 hours. Continue ibuprofen and acetaminophen.  4. COPD chronic and stablewith nicotine abuse.No clinical signs of exacerbation. On mometasone andtiotropium.Continuenicotine patch for smoking cessation  5. Depression.Continuequetiapine.  6. History of alcohol and opioid abusewith chronic pain syndrome.Patient has been receiving buprenorphine and as needed lorazepam. Improved withdrawals symptoms. Needs placement at rehab.   DVT prophylaxis:enoxaparin Code Status:full Family Communication:no family at the bedside Disposition Plan:Will need snf when stable and improves oral intake.   Consultants:    Procedures:    Antimicrobials:  Po amoxicilline   Subjective: Persistent abdominal pain, improved with analgesics, no vomiting, but mild nausea, no chest pain or dyspnea.   Objective: Vitals:   06/25/17 1135 06/25/17 1349 06/25/17 2223 06/26/17 0522  BP:  106/70 109/72 131/78  Pulse:  92 94 81  Resp:  16 16 18   Temp:  98.8 F (37.1 C) 98.9 F (37.2 C) (!) 97.5 F (36.4 C)  TempSrc:  Oral Oral Oral  SpO2: 99% 100% 100% 100%  Weight:      Height:        Intake/Output Summary (Last 24 hours) at 06/26/2017 1114 Last data filed at 06/25/2017 2100 Gross per 24 hour  Intake 240 ml  Output 850 ml  Net -610 ml   Filed Weights   06/21/17 1514 06/21/17 2143  Weight: 38.6 kg (85 lb) 30.8 kg (67 lb 14.4 oz)    Examination:   General: deconditioned  Neurology: Awake and alert, non focal  E ENT: mild pallor, no icterus, oral mucosa moist Cardiovascular: No JVD. S1-S2 present, rhythmic, no gallops, rubs, or murmurs. No lower extremity edema. Pulmonary: decreased breath sounds bilaterally at bases, adequate air movement, no wheezing, rhonchi or rales. Gastrointestinal. Abdomen with no organomegaly, non tender, no rebound or guarding Skin. No rashes Musculoskeletal: no joint  deformities  Data Reviewed: I have personally reviewed following labs and imaging studies  CBC: Recent Labs  Lab 06/19/17 1344 06/21/17 1746 06/22/17 0437  WBC 10.5 12.4* 9.7  NEUTROABS 8.0* 11.2*  --   HGB 14.6 16.0* 13.2  HCT 43.1 47.5* 39.2  MCV 91.1 92.1 91.0  PLT 327 320 664   Basic Metabolic Panel: Recent Labs  Lab 06/19/17 1344 06/21/17 1746 06/23/17 0716  NA 140 141 138  K 3.7 3.9 4.0  CL 106 102 109  CO2 25 28 23   GLUCOSE 103* 141* 108*  BUN 25* 23* 16  CREATININE 0.83 0.89 0.80  CALCIUM 9.9 10.9* 8.8*   GFR: Estimated Creatinine Clearance: 34.5 mL/min (by C-G formula based on SCr of 0.8 mg/dL). Liver Function Tests: Recent Labs  Lab 06/19/17 1344 06/21/17 1746  AST 17 18  ALT 14 16  ALKPHOS 89 116  BILITOT 0.6 0.4  PROT 7.3 9.1*  ALBUMIN 4.0 5.1*   Recent Labs  Lab 06/19/17 1344 06/21/17 1746  LIPASE 37 33   No results for input(s): AMMONIA in the last 168 hours. Coagulation Profile: No results for input(s): INR, PROTIME in the last 168 hours. Cardiac Enzymes: No results for input(s): CKTOTAL, CKMB, CKMBINDEX, TROPONINI in the last 168 hours. BNP (last 3 results) No results for input(s): PROBNP in the last 8760 hours. HbA1C: No results for input(s): HGBA1C in the last 72 hours. CBG: No results for input(s): GLUCAP in the last 168 hours. Lipid Profile: No results for input(s): CHOL, HDL, LDLCALC, TRIG, CHOLHDL, LDLDIRECT in the last 72 hours. Thyroid Function Tests: No results for input(s): TSH, T4TOTAL, FREET4, T3FREE, THYROIDAB in the last 72 hours. Anemia Panel: No results for input(s): VITAMINB12, FOLATE, FERRITIN, TIBC, IRON, RETICCTPCT in the last 72 hours.    Radiology Studies: I have reviewed all of the imaging during this hospital visit personally     Scheduled Meds: . amoxicillin  500 mg Oral Q12H  . buprenorphine  4 mg Sublingual Daily  . erythromycin   Left Eye Q8H  . feeding supplement  1 Container Oral TID  BM  . heparin injection (subcutaneous)  5,000 Units Subcutaneous Q12H  . mouth rinse  15 mL Mouth Rinse BID  . mometasone-formoterol  2 puff Inhalation BID  . multivitamin with minerals  1 tablet Oral Daily  . nicotine  21 mg Transdermal Daily  . nutrition supplement (JUVEN)  1 packet Oral BID BM  . QUEtiapine  50 mg Oral BID  . thiamine  100 mg Oral Daily  . tiotropium  18 mcg Inhalation Daily   Continuous Infusions:   LOS: 5 days        Mauricio Gerome Apley, MD [facility] Pager [phone]

## 2017-06-26 NOTE — Progress Notes (Signed)
Checked frequently, rested quietly at long intervals with eyes closed, resp even and unlabored. NAD noted. Agree with previous nursing assessment. No change in status. Uneventful night.

## 2017-06-27 MED ORDER — TRAMADOL HCL 50 MG PO TABS
25.0000 mg | ORAL_TABLET | Freq: Four times a day (QID) | ORAL | Status: DC | PRN
  Administered 2017-06-27 – 2017-06-28 (×3): 25 mg via ORAL
  Filled 2017-06-27 (×3): qty 1

## 2017-06-27 NOTE — Progress Notes (Addendum)
CSW following for disposition- have been pursuing SNF for short term rehab, barriers of Medicaid insurance and history of substance use and mental health disorders limiting options. Pt had bed offer at [facility], however pt prescribed subutex. Which facility cannot continue.  No SNF options at this time.  15:00- discussed above with husband. Husband expressed he feels frustrated in feeling that pt's plan of care re: her medications is not being communicated with him. Pt's husband states, "If rehab is not going to be an option, and it seems it will not since she is taking the suboxone, then we need to turn to plan B which would be wean her off the tramadol and come back home. We can have [facility] come help [place] again at home." Pt's husband states he prefers pt be able to admit to a SNF for rehab if possible but also that he understands limitations/barriers. States he feels he needs a clear plan for pt's medication needs at DC in order to proceed with planning.   Sharren Bridge, MSW, LCSW Clinical Social Work 06/27/2017 [phone]

## 2017-06-27 NOTE — Progress Notes (Signed)
PROGRESS NOTE    [name]  AST:[id number] DOB: September 14, 1950 DOA: 06/21/2017 PCP: Azzie Glatter, FNP    Brief Narrative:  67 year old female who presented abdominal pain, weakness and dysuria. She does have a significant past medical history of chronic alcoholic proctitis, renal cancer, gastric ulcer, COPD, andhypertension.Reported abdominal pain for last 7 days, associated with significant poor oral intake, diarrhea and dysuria. About 10 pound nonintentional weight loss over the last 14days. On the initial physical examination her heart rate was 126, respiratory rate21, blood pressure 989 systolic, moist mucous membranes, lungs clear to auscultation bilaterally, heart S1-S2 present rhythmic, tachycardic, abdomen tender to palpation gastric region, no guarding or rebound. No lower extremity edema.Sodium 141, potassium 3.9, chloride 102, bicarb 28, glucose 141, BUN 23, creatinine 0.89, white count 12.4, hemoglobin 16.0, hematocrit 47.5 platelets 320.Urinalysis specific gravity 1.023, protein 100, white cells 21-50.Urine drug screen positive for benzodiazepines. Chest radiograph was negative for infiltrates. EKG with sinus tachycardia, poor R wave progression, left ventricular hypertrophy.  Patient was admitted to the hospital with working diagnosis of abdominal pain to rule out urinary tract infection, complicated by severe calorie protein malnutrition.   Assessment & Plan:   Principal Problem:   Failure to thrive in adult Active Problems:   Hepatic cirrhosis (HCC)   HTN (hypertension), malignant   Chronic alcoholic pancreatitis ([place])   Benzodiazepine dependence ([place])   1.Urinary tractinfection complicated with urinary retentiondue to E. Coli, present on admission.On Amoxicillin to complete in 06/25.   2.Severe calorie protein malnutrition. On boost tid, Juven bid, multivitamins.Continue to improved appetite, but continue to be very weak.  3. 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 Continue ibuprofen andacetaminophen. Patient continue to be on buprenorphine.   4. COPD chronic and stablewith nicotine abuse.Continue with no clinical signs of exacerbation. Continue mometasone, tiotropium, and nicotine patchfor smoking cessation  5. Depression.On quetiapine.  6. History of alcohol and opioid abusewith chronic pain syndrome.Patient has been receiving buprenorphine and as needed lorazepam. Continue to improve withdrawals symptoms. Pain clinic called [phone] to coordinate further pain management as outpatient   DVT prophylaxis:enoxaparin Code Status:full Family Communication:I spoke with patient's  husband at the bedside and all questions were addressed.  Disposition Plan:pending placement.    Consultants:    Procedures:    Antimicrobials:  Po amoxicilline   Subjective: Patient is feeling better, dyspnea continue to improve slowly, no nausea or vomiting and pain is continue to be controlled, agrees in reduction in the dose of ultram.  Objective: Vitals:   06/26/17 0522 06/26/17 1335 06/26/17 2052 06/27/17 0542  BP: 131/78 94/83 (!) 141/93 126/72  Pulse: 81 86 79 72  Resp: 18 12 16 16   Temp: (!) 97.5 F (36.4 C) 98.4 F (36.9 C) 98.4 F (36.9 C) 98.4 F (36.9 C)  TempSrc: Oral Oral Oral Oral  SpO2: 100% 99% 99% 100%  Weight:      Height:        Intake/Output Summary (Last 24 hours) at 06/27/2017 1107 Last data filed at 06/27/2017 0700 Gross per 24 hour  Intake 120 ml  Output 400 ml  Net -280 ml   Filed Weights   06/21/17 1514 06/21/17 2143  Weight: 38.6 kg (85 lb) 30.8 kg (67 lb 14.4 oz)    Examination:   General: Not in pain or dyspnea, deconditioned  Neurology: Awake and alert, non focal  E ENT: mild pallor, no icterus, oral mucosa moist Cardiovascular:  No JVD. S1-S2 present, rhythmic, no  gallops, rubs, or murmurs. No lower extremity edema. Pulmonary: vesicular breath sounds bilaterally, adequate air movement, no wheezing, rhonchi or rales. Gastrointestinal. Abdomen with no organomegaly, non tender, no rebound or guarding Skin. No rashes Musculoskeletal: no joint deformities     Data Reviewed: I have personally reviewed following labs and imaging studies  CBC: Recent Labs  Lab 06/21/17 1746 06/22/17 0437  WBC 12.4* 9.7  NEUTROABS 11.2*  --   HGB 16.0* 13.2  HCT 47.5* 39.2  MCV 92.1 91.0  PLT 320 970   Basic Metabolic Panel: Recent Labs  Lab 06/21/17 1746 06/23/17 0716  NA 141 138  K 3.9 4.0  CL 102 109  CO2 28 23  GLUCOSE 141* 108*  BUN 23* 16  CREATININE 0.89 0.80  CALCIUM 10.9* 8.8*   GFR: Estimated Creatinine Clearance: 34.5 mL/min (by C-G formula based on SCr of 0.8 mg/dL). Liver Function Tests: Recent Labs  Lab 06/21/17 1746  AST 18  ALT 16  ALKPHOS 116  BILITOT 0.4  PROT 9.1*  ALBUMIN 5.1*   Recent Labs  Lab 06/21/17 1746  LIPASE 33   No results for input(s): AMMONIA in the last 168 hours. Coagulation Profile: No results for input(s): INR, PROTIME in the last 168 hours. Cardiac Enzymes: No results for input(s): CKTOTAL, CKMB, CKMBINDEX, TROPONINI in the last 168 hours. BNP (last 3 results) No results for input(s): PROBNP in the last 8760 hours. HbA1C: No results for input(s): HGBA1C in the last 72 hours. CBG: No results for input(s): GLUCAP in the last 168 hours. Lipid Profile: No results for input(s): CHOL, HDL, LDLCALC, TRIG, CHOLHDL, LDLDIRECT in the last 72 hours. Thyroid Function Tests: No results for input(s): TSH, T4TOTAL, FREET4, T3FREE, THYROIDAB in the last 72 hours. Anemia Panel: No results for input(s): VITAMINB12, FOLATE, FERRITIN, TIBC, IRON, RETICCTPCT in the last 72 hours.    Radiology Studies: I have reviewed all of the imaging during this hospital visit personally     Scheduled Meds: .  amoxicillin  500 mg Oral Q12H  . buprenorphine  4 mg Sublingual Daily  . erythromycin   Left Eye Q8H  . feeding supplement  1 Container Oral TID BM  . heparin injection (subcutaneous)  5,000 Units Subcutaneous Q12H  . mouth rinse  15 mL Mouth Rinse BID  . mometasone-formoterol  2 puff Inhalation BID  . multivitamin with minerals  1 tablet Oral Daily  . nicotine  21 mg Transdermal Daily  . nutrition supplement (JUVEN)  1 packet Oral BID BM  . QUEtiapine  50 mg Oral BID  . thiamine  100 mg Oral Daily  . tiotropium  18 mcg Inhalation Daily   Continuous Infusions:   LOS: 6 days        Mauricio Gerome Apley, MD [facility] Pager [phone]

## 2017-06-27 NOTE — Progress Notes (Signed)
Physical Therapy Treatment Patient Details Name: [name] MRN: [id number] DOB: Jun 09, 1950 Today's Date: 06/27/2017    History of Present Illness 67 y.o. female with medical history significant for chronic alcoholic pancreatitis, renal cancer , gastric ulcer, COPD GOLD stage III, HTN, alcohol withdrawal and benzodiazepine dependence, drug-seeking behavior, was brought to the ED via EMS; admitted 06/21/17 with FTT    PT Comments    Progressing slowly with mobility. Pt tends to self-limit activity. She walked ~20 feet x 2 on today with a RW. She appeared to tolerate activity fairly well. She could potentially tolerate increased activity if she was willing to. Discussed d/c plan-pt stated she prefers to return home instead of going to rehab. She did report that her husband works so she would be home alone for a great chunk of time. Will continue to follow and progress activity as tolerated. If pt chooses to return home, recommend HHPT if she is agreeable.     Follow Up Recommendations  SNF     Equipment Recommendations  None recommended by PT    Recommendations for Other Services       Precautions / Restrictions Precautions Precautions: Fall Restrictions Weight Bearing Restrictions: No    Mobility  Bed Mobility Overal bed mobility: Modified Independent                Transfers Overall transfer level: Needs assistance Equipment used: Rolling walker (2 wheeled) Transfers: Sit to/from Stand Sit to Stand: Min guard         General transfer comment: close guard for safety. VCs hand placement  Ambulation/Gait Ambulation/Gait assistance: Min guard Gait Distance (Feet): 20 Feet(x2) Assistive device: Rolling walker (2 wheeled) Gait Pattern/deviations: Step-through pattern;Decreased stride length     General Gait Details: pt agreeable to walk a short distance. walked x 2 in room with use of RW. close guard for safety.    Stairs             Wheelchair  Mobility    Modified Rankin (Stroke Patients Only)       Balance Overall balance assessment: Needs assistance         Standing balance support: Bilateral upper extremity supported Standing balance-Leahy Scale: Poor                              Cognition Arousal/Alertness: Awake/alert Behavior During Therapy: WFL for tasks assessed/performed Overall Cognitive Status: Within Functional Limits for tasks assessed                                        Exercises      General Comments        Pertinent Vitals/Pain Pain Assessment: Faces Faces Pain Scale: No hurt    Home Living                      Prior Function            PT Goals (current goals can now be found in the care plan section) Progress towards PT goals: Progressing toward goals    Frequency    Min 2X/week      PT Plan Current plan remains appropriate    Co-evaluation              AM-PAC PT "6 Clicks" Daily Activity  Outcome Measure  Difficulty turning  over in bed (including adjusting bedclothes, sheets and blankets)?: None Difficulty moving from lying on back to sitting on the side of the bed? : None Difficulty sitting down on and standing up from a chair with arms (e.g., wheelchair, bedside commode, etc,.)?: A Little Help needed moving to and from a bed to chair (including a wheelchair)?: A Little Help needed walking in hospital room?: A Little Help needed climbing 3-5 steps with a railing? : A Lot 6 Click Score: 19    End of Session Equipment Utilized During Treatment: Oxygen Activity Tolerance: Patient limited by fatigue Patient left: in bed;with call bell/phone within reach;with bed alarm set   PT Visit Diagnosis: Difficulty in walking, not elsewhere classified (R26.2);Adult, failure to thrive (R62.7)     Time: 8891-6945 PT Time Calculation (min) (ACUTE ONLY): 13 min  Charges:  $Gait Training: 8-22 mins                    G Codes:          Weston Anna, MPT Pager: [phone]

## 2017-06-28 DIAGNOSIS — J439 Emphysema, unspecified: Secondary | ICD-10-CM

## 2017-06-28 MED ORDER — BOOST / RESOURCE BREEZE PO LIQD CUSTOM: 1.0000 | ORAL | Status: DC

## 2017-06-28 MED ORDER — PANTOPRAZOLE SODIUM 40 MG PO TBEC: 40.0000 mg | DELAYED_RELEASE_TABLET | Freq: Every day | ORAL | Status: DC

## 2017-06-28 MED ORDER — IBUPROFEN 400 MG PO TABS: 400.0000 mg | ORAL_TABLET | Freq: Four times a day (QID) | ORAL | 0 refills | Status: DC | PRN

## 2017-06-28 MED ORDER — JUVEN PO PACK: 1.0000 | PACK | Freq: Two times a day (BID) | ORAL | 0 refills | Status: AC

## 2017-06-28 MED ORDER — ONDANSETRON HCL 4 MG PO TABS: 4.0000 mg | ORAL_TABLET | Freq: Four times a day (QID) | ORAL | 0 refills | Status: DC | PRN

## 2017-06-28 MED ORDER — ACETAMINOPHEN 325 MG PO TABS: 650.0000 mg | ORAL_TABLET | Freq: Four times a day (QID) | ORAL | 0 refills | Status: DC | PRN

## 2017-06-28 MED ORDER — PANTOPRAZOLE SODIUM 40 MG PO TBEC: 40.0000 mg | DELAYED_RELEASE_TABLET | Freq: Every day | ORAL | 0 refills | Status: DC

## 2017-06-28 MED ORDER — MOMETASONE FURO-FORMOTEROL FUM 200-5 MCG/ACT IN AERO: 2.0000 | INHALATION_SPRAY | Freq: Two times a day (BID) | RESPIRATORY_TRACT | 0 refills | Status: DC

## 2017-06-28 MED ORDER — TIOTROPIUM BROMIDE MONOHYDRATE 18 MCG IN CAPS: 18.0000 ug | ORAL_CAPSULE | Freq: Every day | RESPIRATORY_TRACT | 0 refills | Status: DC

## 2017-06-28 MED ORDER — ADULT MULTIVITAMIN W/MINERALS CH: 1.0000 | ORAL_TABLET | Freq: Every day | ORAL | 0 refills | Status: DC

## 2017-06-28 NOTE — Progress Notes (Addendum)
Physician Discharge Summary  [name] JSE:[id number] DOB: 1950/10/31 DOA: 06/21/2017  PCP: Azzie Glatter, FNP  Admit date: 06/21/2017 Discharge date: 06/28/2017  Admitted From: Home  Disposition:  Home   Recommendations for Outpatient Follow-up and new medication changes:  1. Follow up with Kathe Becton FNP in 7 days 2. Pain clinic on the day of discharge  3. Patient has decided to go home with home health services, not able to find snf that will continue buprenorphine.   Home Health: Yes  Equipment/Devices: no    Discharge Condition: stable  CODE STATUS: full  Diet recommendation: regular   Brief/Interim Summary: 67 year old female who presented abdominal pain, weakness and dysuria. She does have a significant past medical history of chronic alcoholic proctitis, renal cancer, gastric ulcer, COPD, andhypertension.Reported abdominal pain for last 7 days, associated with significant poor oral intake, diarrhea and dysuria. About 10 pound of nonintentional weight loss over the last 14days. On the initial physical examination her heart rate was 126, respiratory rate21, blood pressure 073 systolic, moist mucous membranes, lungs clear to auscultation bilaterally, heart S1-S2 present rhythmic, tachycardic, abdomen tender to palpation at the gastric region, no guarding or rebound. No lower extremity edema.Sodium 141, potassium 3.9, chloride 102, bicarb 28, glucose 141, BUN 23, creatinine 0.89, white count 12.4, hemoglobin 16.0, hematocrit 47.5 platelets 320.Urinalysis specific gravity 1.023, protein 100, white cells 21-50.Urine drug screen positive for benzodiazepines. Chest radiograph was negative for infiltrates. EKG with sinus tachycardia, poor R wave progression, left ventricular hypertrophy.  Patient was admitted to the hospital with the working diagnosis of abdominal pain to rule out urinary tract infection, complicated by severe calorie protein malnutrition.  1.   Urinary tract infection complicated by urinary retention, due to E. Coli.  Patient was admitted to the medical ward, she was placed on a remote telemetry monitor, received isotonic IV fluids and antibiotic therapy with IV ceftriaxone.  She did require bladder catheterizations during the first few days of her hospitalization.  By the time of discharge no further urinary retention, she has completed antibiotic therapy in the hospital with po ampicillin.  2.  Severe calorie protein malnutrition.  Patient was seen by nutritionist, she was placed on nutritional supplements, boost 3 times daily, Juven twice daily and multivitamins.  Her appetite has been improving, received as needed antiemetics.  3.  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  She had significant abdominal pain, that required use of tramadol as needed.  Her pain has been improving and tramadol has been slowly reduced in frequency and strength.  4.  COPD with chronic nicotine abuse.  Stable with no signs of exacerbation, patient was continue bronchodilator therapy and nicotine replacement therapy.  5.  History of alcohol and opioid abuse/dependence.  Patient will follow up with the pain clinic to continue buprenorphine for pain control, she did required as needed lorazepam for withdrawal symptoms.  Patient will be seen at the pain clinic today.     Discharge Diagnoses:  Principal Problem:   Failure to thrive in adult Active Problems:   Hepatic cirrhosis (HCC)   HTN (hypertension), malignant   Chronic alcoholic pancreatitis ([place])   Benzodiazepine dependence ([place])    Discharge Instructions   Allergies as of 06/28/2017      Reactions   Aspirin Other (See Comments)   Break out in welts   Chlorpromazine Hcl Other (See Comments)   Muscle spasms      Medication List    STOP taking these medications   amoxicillin-clavulanate  875-125 MG tablet Commonly known as:  AUGMENTIN   CREON 12000 units Cpep capsule Generic drug:   lipase/protease/amylase   gabapentin 300 MG capsule Commonly known as:  NEURONTIN   LORazepam 0.5 MG tablet Commonly known as:  ATIVAN   losartan 50 MG tablet Commonly known as:  COZAAR   nicotine 21 mg/24hr patch Commonly known as:  NICODERM CQ - dosed in mg/24 hours   predniSONE 20 MG tablet Commonly known as:  DELTASONE   QUEtiapine 50 MG tablet Commonly known as:  SEROQUEL   traMADol 50 MG tablet Commonly known as:  ULTRAM     TAKE these medications   acetaminophen 325 MG tablet Commonly known as:  TYLENOL Take 2 tablets (650 mg total) by mouth every 6 (six) hours as needed for moderate pain (use first).   ibuprofen 400 MG tablet Commonly known as:  ADVIL,MOTRIN Take 1 tablet (400 mg total) by mouth every 6 (six) hours as needed for moderate pain (back pain. if tylenol ineffective).   mometasone-formoterol 200-5 MCG/ACT Aero Commonly known as:  DULERA Inhale 2 puffs into the lungs 2 (two) times daily.   multivitamin with minerals Tabs tablet Take 1 tablet by mouth daily. Start taking on:  06/29/2017   nutrition supplement (JUVEN) Pack Take 1 packet by mouth 2 (two) times daily between meals.   ondansetron 4 MG tablet Commonly known as:  ZOFRAN Take 1 tablet (4 mg total) by mouth every 6 (six) hours as needed for nausea. What changed:    when to take this  reasons to take this   pantoprazole 40 MG tablet Commonly known as:  PROTONIX Take 1 tablet (40 mg total) by mouth daily.   tiotropium 18 MCG inhalation capsule Commonly known as:  SPIRIVA HANDIHALER Place 1 capsule (18 mcg total) into inhaler and inhale daily. Start taking on:  06/29/2017 What changed:    how much to take  how to take this  when to take this  additional instructions       Allergies  Allergen Reactions  . Aspirin Other (See Comments)    Break out in welts  . Chlorpromazine Hcl Other (See Comments)    Muscle spasms    Consultations:     Procedures/Studies: Dg  Chest 2 View  Result Date: 06/11/2017 CLINICAL DATA:  Acute onset of shortness of breath. EXAM: CHEST - 2 VIEW COMPARISON:  Chest radiograph performed 03/08/2017 FINDINGS: The lungs are hyperexpanded, with flattening of the hemidiaphragms, compatible with COPD. There is no evidence of pleural effusion or pneumothorax. The heart is normal in size; the mediastinal contour is within normal limits. No acute osseous abnormalities are seen. IMPRESSION: Findings of COPD; no acute cardiopulmonary process seen. Electronically Signed   By: Garald Balding M.D.   On: 06/11/2017 05:00   Dg Abd Acute W/chest  Result Date: 08-14-2017 CLINICAL DATA:  Generalized abdominal pain EXAM: DG ABDOMEN ACUTE W/ 1V CHEST COMPARISON:  06/11/2017 FINDINGS: There is hyperinflation of the lungs compatible with COPD. Lungs clear. Heart is normal size. No effusions. No evidence of bowel obstruction or free air. No organomegaly or suspicious calcification. IMPRESSION: No obstruction or free air. COPD. No active cardiopulmonary disease. Electronically Signed   By: Rolm Baptise M.D.   On: 008-11-2017 14:28       Subjective: Patient continue to have improve appetite and decrease nausea, tolerating po well. Pain is better controlled.   Discharge Exam: Vitals:   06/28/17 0520 06/28/17 0808  BP: 118/66   Pulse:  70   Resp: 20   Temp: 98.7 F (37.1 C)   SpO2: 100% 100%   Vitals:   06/27/17 1414 06/27/17 1959 06/28/17 0520 06/28/17 0808  BP: 115/65 132/72 118/66   Pulse: 68 98 70   Resp: 18 18 20    Temp: 99.1 F (37.3 C) 99 F (37.2 C) 98.7 F (37.1 C)   TempSrc: Oral Oral Oral   SpO2: 100% 100% 100% 100%  Weight:      Height:        General: Not in pain or dyspnea, deconditioned  Neurology: Awake and alert, non focal  E ENT: no pallor, no icterus, oral mucosa moist Cardiovascular: No JVD. S1-S2 present, rhythmic, no gallops, rubs, or murmurs. No lower extremity edema. Pulmonary: vesicular breath sounds bilaterally,  adequate air movement, no wheezing, rhonchi or rales. Gastrointestinal. Abdomen flat, no organomegaly, non tender, no rebound or guarding Skin. No rashes Musculoskeletal: no joint deformities   The results of significant diagnostics from this hospitalization (including imaging, microbiology, ancillary and laboratory) are listed below for reference.     Microbiology: Recent Results (from the past 240 hour(s))  Culture, Urine     Status: Abnormal   Collection Time: 06/21/17  6:45 PM  Result Value Ref Range Status   Specimen Description   Final    URINE, CLEAN CATCH Performed at [facility], [place] [address] [place], [place] [address]    Special Requests   Final    NONE Performed at [facility], [place] [address], [place], [place] [address]    Culture >=100,000 COLONIES/mL ESCHERICHIA COLI (A)  Final   Report Status 06/24/2017 FINAL  Final   Organism ID, Bacteria ESCHERICHIA COLI (A)  Final      Susceptibility   Escherichia coli - MIC*    AMPICILLIN 4 SENSITIVE Sensitive     CEFAZOLIN <=4 SENSITIVE Sensitive     CEFTRIAXONE <=1 SENSITIVE Sensitive     CIPROFLOXACIN <=0.25 SENSITIVE Sensitive     GENTAMICIN <=1 SENSITIVE Sensitive     IMIPENEM <=0.25 SENSITIVE Sensitive     NITROFURANTOIN <=16 SENSITIVE Sensitive     TRIMETH/SULFA <=20 SENSITIVE Sensitive     AMPICILLIN/SULBACTAM <=2 SENSITIVE Sensitive     PIP/TAZO <=4 SENSITIVE Sensitive     Extended ESBL NEGATIVE Sensitive     * >=100,000 COLONIES/mL ESCHERICHIA COLI     Labs: BNP (last 3 results) No results for input(s): BNP in the last 8760 hours. Basic Metabolic Panel: Recent Labs  Lab 06/21/17 1746 06/23/17 0716  NA 141 138  K 3.9 4.0  CL 102 109  CO2 28 23  GLUCOSE 141* 108*  BUN 23* 16  CREATININE 0.89 0.80  CALCIUM 10.9* 8.8*   Liver Function Tests: Recent Labs  Lab 06/21/17 1746  AST 18  ALT 16  ALKPHOS 116  BILITOT 0.4  PROT 9.1*  ALBUMIN 5.1*   Recent  Labs  Lab 06/21/17 1746  LIPASE 33   No results for input(s): AMMONIA in the last 168 hours. CBC: Recent Labs  Lab 06/21/17 1746 06/22/17 0437  WBC 12.4* 9.7  NEUTROABS 11.2*  --   HGB 16.0* 13.2  HCT 47.5* 39.2  MCV 92.1 91.0  PLT 320 256   Cardiac Enzymes: No results for input(s): CKTOTAL, CKMB, CKMBINDEX, TROPONINI in the last 168 hours. BNP: Invalid input(s): POCBNP CBG: No results for input(s): GLUCAP in the last 168 hours. D-Dimer No results for input(s): DDIMER in the last 72 hours. Hgb A1c  No results for input(s): HGBA1C in the last 72 hours. Lipid Profile No results for input(s): CHOL, HDL, LDLCALC, TRIG, CHOLHDL, LDLDIRECT in the last 72 hours. Thyroid function studies No results for input(s): TSH, T4TOTAL, T3FREE, THYROIDAB in the last 72 hours.  Invalid input(s): FREET3 Anemia work up No results for input(s): VITAMINB12, FOLATE, FERRITIN, TIBC, IRON, RETICCTPCT in the last 72 hours. Urinalysis    Component Value Date/Time   COLORURINE AMBER (A) 06/21/2017 1845   APPEARANCEUR CLOUDY (A) 06/21/2017 1845   LABSPEC 1.023 06/21/2017 1845   PHURINE 5.0 06/21/2017 1845   GLUCOSEU NEGATIVE 06/21/2017 1845   HGBUR LARGE (A) 06/21/2017 1845   BILIRUBINUR NEGATIVE 06/21/2017 1845   BILIRUBINUR negative 06/03/2017 1120   KETONESUR 5 (A) 06/21/2017 1845   PROTEINUR 100 (A) 06/21/2017 1845   UROBILINOGEN 0.2 06/03/2017 1120   UROBILINOGEN 0.2 03/15/2017 1004   NITRITE NEGATIVE 06/21/2017 1845   LEUKOCYTESUR SMALL (A) 06/21/2017 1845   Sepsis Labs Invalid input(s): PROCALCITONIN,  WBC,  LACTICIDVEN Microbiology Recent Results (from the past 240 hour(s))  Culture, Urine     Status: Abnormal   Collection Time: 06/21/17  6:45 PM  Result Value Ref Range Status   Specimen Description   Final    URINE, CLEAN CATCH Performed at [facility], [place] [address] [place], [place] [address]    Special Requests   Final    NONE Performed at [facility], [place] [address], [place], [place] [address]    Culture >=100,000 COLONIES/mL ESCHERICHIA COLI (A)  Final   Report Status 06/24/2017 FINAL  Final   Organism ID, Bacteria ESCHERICHIA COLI (A)  Final      Susceptibility   Escherichia coli - MIC*    AMPICILLIN 4 SENSITIVE Sensitive     CEFAZOLIN <=4 SENSITIVE Sensitive     CEFTRIAXONE <=1 SENSITIVE Sensitive     CIPROFLOXACIN <=0.25 SENSITIVE Sensitive     GENTAMICIN <=1 SENSITIVE Sensitive     IMIPENEM <=0.25 SENSITIVE Sensitive     NITROFURANTOIN <=16 SENSITIVE Sensitive     TRIMETH/SULFA <=20 SENSITIVE Sensitive     AMPICILLIN/SULBACTAM <=2 SENSITIVE Sensitive     PIP/TAZO <=4 SENSITIVE Sensitive     Extended ESBL NEGATIVE Sensitive     * >=100,000 COLONIES/mL ESCHERICHIA COLI     Time coordinating discharge: 45 minutes  SIGNED:   Tawni Millers, MD  [facility] 06/28/2017, 11:45 AM Pager [phone]  If 7PM-7AM, please contact night-coverage [email or web address] Password [email or web address]

## 2017-06-28 NOTE — Care Management Note (Signed)
Case Management Note  Patient Details  Name: [name] MRN: [id number] Date of Birth: 04/26/1950  Subjective/Objective:     Failure to thrive in adult, UTI, Severe calorie protein malnutrition               Action/Plan: [facility] will follow pt at home with [facility]. [facility] would not accept pt back. This was explained to pt. In house rep from [place] spoke to pt and explained why they are not taking the pt back.    Expected Discharge Date:  06/28/17               Expected Discharge Plan:  [place]  In-House Referral:  Clinical Social Work  Discharge planning Services  CM Consult  Post Acute Care Choice:    Choice offered to:  Patient  DME Arranged:    DME Agency:     HH Arranged:  RN, PT [place] Agency:  [place]  Status of Service:  Completed, signed off  If discussed at [place] of Stay Meetings, dates discussed:    Additional CommentsPurcell Mouton, RN 06/28/2017, 12:11 PM

## 2017-06-28 NOTE — Discharge Summary (Signed)
[name] ZOX:[id number] DOB: 1950/06/02 DOA: 06/21/2017  PCP: Azzie Glatter, FNP  Admit date: 06/21/2017 Discharge date: 06/28/2017  Admitted From: Home  Disposition:  Home   Recommendations for Outpatient Follow-up and new medication changes:  1. Follow up with Kathe Becton FNP in 7 days 2. Pain clinic on the day of discharge  3. Patient has decided to go home with home health services, not able to find snf that will continue buprenorphine.   Home Health: Yes  Equipment/Devices: no    Discharge Condition: stable  CODE STATUS: full  Diet recommendation: regular   Brief/Interim Summary: 67 year old female who presented abdominal pain, weakness and dysuria. She does have a significant past medical history of chronic alcoholic proctitis, renal cancer, gastric ulcer, COPD, andhypertension.Reported abdominal pain for last 7 days, associated with significant poor oral intake, diarrhea and dysuria. About 10 pound of nonintentional weight loss over the last 14days. On the initial physical examination her heart rate was 126, respiratory rate21, blood pressure 811 systolic, moist mucous membranes, lungs clear to auscultation bilaterally, heart S1-S2 present rhythmic, tachycardic, abdomen tender to palpation at the gastric region, no guarding or rebound. No lower extremity edema.Sodium 141, potassium 3.9, chloride 102, bicarb 28, glucose 141, BUN 23, creatinine 0.89, white count 12.4, hemoglobin 16.0, hematocrit 47.5 platelets 320.Urinalysis specific gravity 1.023, protein 100, white cells 21-50.Urine drug screen positive for benzodiazepines. Chest radiograph was negative for infiltrates. EKG with sinus tachycardia, poor R wave progression, left ventricular hypertrophy.  Patient was admitted to the hospital with the working diagnosis of abdominal pain to rule out urinary tract infection, complicated by severe calorie protein malnutrition.  1.  Urinary tract  infection complicated by urinary retention, due to E. Coli.  Patient was admitted to the medical ward, she was placed on a remote telemetry monitor, received isotonic IV fluids and antibiotic therapy with IV ceftriaxone.  She did require bladder catheterizations during the first few days of her hospitalization.  By the time of discharge no further urinary retention, she has completed antibiotic therapy in the hospital with po ampicillin.  2.  Severe calorie protein malnutrition.  Patient was seen by nutritionist, she was placed on nutritional supplements, boost 3 times daily, Juven twice daily and multivitamins.  Her appetite has been improving, received as needed antiemetics.  3.  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  She had significant abdominal pain, that required use of tramadol as needed.  Her pain has been improving and tramadol has been slowly reduced in frequency and strength.  4.  COPD with chronic nicotine abuse.  Stable with no signs of exacerbation, patient was continue bronchodilator therapy and nicotine replacement therapy.  5.  History of alcohol and opioid abuse/dependence.  Patient will follow up with the pain clinic to continue buprenorphine for pain control, she did required as needed lorazepam for withdrawal symptoms.  Patient will be seen at the pain clinic today.    Discharge Diagnoses:  Principal Problem:   Failure to thrive in adult Active Problems:   Hepatic cirrhosis (HCC)   HTN (hypertension), malignant   Chronic alcoholic pancreatitis ([place])   Benzodiazepine dependence ([place])    Discharge Instructions       Allergies as of 06/28/2017      Reactions   Aspirin Other (See Comments)   Break out in welts   Chlorpromazine Hcl Other (See Comments)   Muscle spasms         Medication List    STOP taking these medications  amoxicillin-clavulanate 875-125 MG tablet Commonly known as:  AUGMENTIN   CREON 12000 units Cpep capsule Generic  drug:  lipase/protease/amylase   gabapentin 300 MG capsule Commonly known as:  NEURONTIN   LORazepam 0.5 MG tablet Commonly known as:  ATIVAN   losartan 50 MG tablet Commonly known as:  COZAAR   nicotine 21 mg/24hr patch Commonly known as:  NICODERM CQ - dosed in mg/24 hours   predniSONE 20 MG tablet Commonly known as:  DELTASONE   QUEtiapine 50 MG tablet Commonly known as:  SEROQUEL   traMADol 50 MG tablet Commonly known as:  ULTRAM     TAKE these medications   acetaminophen 325 MG tablet Commonly known as:  TYLENOL Take 2 tablets (650 mg total) by mouth every 6 (six) hours as needed for moderate pain (use first).   ibuprofen 400 MG tablet Commonly known as:  ADVIL,MOTRIN Take 1 tablet (400 mg total) by mouth every 6 (six) hours as needed for moderate pain (back pain. if tylenol ineffective).   mometasone-formoterol 200-5 MCG/ACT Aero Commonly known as:  DULERA Inhale 2 puffs into the lungs 2 (two) times daily.   multivitamin with minerals Tabs tablet Take 1 tablet by mouth daily. Start taking on:  06/29/2017   nutrition supplement (JUVEN) Pack Take 1 packet by mouth 2 (two) times daily between meals.   ondansetron 4 MG tablet Commonly known as:  ZOFRAN Take 1 tablet (4 mg total) by mouth every 6 (six) hours as needed for nausea. What changed:    when to take this  reasons to take this   pantoprazole 40 MG tablet Commonly known as:  PROTONIX Take 1 tablet (40 mg total) by mouth daily.   tiotropium 18 MCG inhalation capsule Commonly known as:  SPIRIVA HANDIHALER Place 1 capsule (18 mcg total) into inhaler and inhale daily. Start taking on:  06/29/2017 What changed:    how much to take  how to take this  when to take this  additional instructions           Allergies  Allergen Reactions  . Aspirin Other (See Comments)    Break out in welts  . Chlorpromazine Hcl Other (See Comments)    Muscle spasms     Consultations:     Procedures/Studies: ImagingResults   Dg Chest 2 View  Result Date: 06/11/2017 CLINICAL DATA:  Acute onset of shortness of breath. EXAM: CHEST - 2 VIEW COMPARISON:  Chest radiograph performed 03/08/2017 FINDINGS: The lungs are hyperexpanded, with flattening of the hemidiaphragms, compatible with COPD. There is no evidence of pleural effusion or pneumothorax. The heart is normal in size; the mediastinal contour is within normal limits. No acute osseous abnormalities are seen. IMPRESSION: Findings of COPD; no acute cardiopulmonary process seen. Electronically Signed   By: Garald Balding M.D.   On: 06/11/2017 05:00   Dg Abd Acute W/chest  Result Date: 2020/01/2017 CLINICAL DATA:  Generalized abdominal pain EXAM: DG ABDOMEN ACUTE W/ 1V CHEST COMPARISON:  06/11/2017 FINDINGS: There is hyperinflation of the lungs compatible with COPD. Lungs clear. Heart is normal size. No effusions. No evidence of bowel obstruction or free air. No organomegaly or suspicious calcification. IMPRESSION: No obstruction or free air. COPD. No active cardiopulmonary disease. Electronically Signed   By: Rolm Baptise M.D.   On: 02020/01/2017 14:28        Subjective: Patient continue to have improve appetite and decrease nausea, tolerating po well. Pain is better controlled.   Discharge Exam:  Vitals:   06/28/17 0520 06/28/17 0808  BP: 118/66   Pulse: 70   Resp: 20   Temp: 98.7 F (37.1 C)   SpO2: 100% 100%         Vitals:   06/27/17 1414 06/27/17 1959 06/28/17 0520 06/28/17 0808  BP: 115/65 132/72 118/66   Pulse: 68 98 70   Resp: 18 18 20    Temp: 99.1 F (37.3 C) 99 F (37.2 C) 98.7 F (37.1 C)   TempSrc: Oral Oral Oral   SpO2: 100% 100% 100% 100%  Weight:      Height:        General: Not in pain or dyspnea, deconditioned  Neurology: Awake and alert, non focal  E ENT: no pallor, no icterus, oral mucosa moist Cardiovascular: No JVD. S1-S2  present, rhythmic, no gallops, rubs, or murmurs. No lower extremity edema. Pulmonary: vesicular breath sounds bilaterally, adequate air movement, no wheezing, rhonchi or rales. Gastrointestinal. Abdomen flat, no organomegaly, non tender, no rebound or guarding Skin. No rashes Musculoskeletal: no joint deformities    The results of significant diagnostics from this hospitalization (including imaging, microbiology, ancillary and laboratory) are listed below for reference.     Microbiology:        Recent Results (from the past 240 hour(s))  Culture, Urine     Status: Abnormal   Collection Time: 06/21/17  6:45 PM  Result Value Ref Range Status   Specimen Description   Final    URINE, CLEAN CATCH Performed at [facility] , [place] [address] [place], [place] [address]    Special Requests   Final    NONE Performed at [facility], [place] [address] [place], [place] [address]    Culture >=100,000 COLONIES/mL ESCHERICHIA COLI (A)  Final   Report Status 06/24/2017 FINAL  Final   Organism ID, Bacteria ESCHERICHIA COLI (A)  Final      Susceptibility   Escherichia coli - MIC*    AMPICILLIN 4 SENSITIVE Sensitive     CEFAZOLIN <=4 SENSITIVE Sensitive     CEFTRIAXONE <=1 SENSITIVE Sensitive     CIPROFLOXACIN <=0.25 SENSITIVE Sensitive     GENTAMICIN <=1 SENSITIVE Sensitive     IMIPENEM <=0.25 SENSITIVE Sensitive     NITROFURANTOIN <=16 SENSITIVE Sensitive     TRIMETH/SULFA <=20 SENSITIVE Sensitive     AMPICILLIN/SULBACTAM <=2 SENSITIVE Sensitive     PIP/TAZO <=4 SENSITIVE Sensitive     Extended ESBL NEGATIVE Sensitive     * >=100,000 COLONIES/mL ESCHERICHIA COLI     Labs: BNP (last 3 results) RecentLabs(withinlast365days)  No results for input(s): BNP in the last 8760 hours.   Basic Metabolic Panel: LastLabs      Recent Labs  Lab 06/21/17 1746 06/23/17 0716  NA 141 138  K 3.9  4.0  CL 102 109  CO2 28 23  GLUCOSE 141* 108*  BUN 23* 16  CREATININE 0.89 0.80  CALCIUM 10.9* 8.8*     Liver Function Tests: LastLabs     Recent Labs  Lab 06/21/17 1746  AST 18  ALT 16  ALKPHOS 116  BILITOT 0.4  PROT 9.1*  ALBUMIN 5.1*     LastLabs  Recent Labs  Lab 06/21/17 1746  LIPASE 33     LastLabs  No results for input(s): AMMONIA in the last 168 hours.   CBC: LastLabs      Recent Labs  Lab 06/21/17 1746 06/22/17 0437  WBC 12.4* 9.7  NEUTROABS 11.2*  --  HGB 16.0* 13.2  HCT 47.5* 39.2  MCV 92.1 91.0  PLT 320 256     Cardiac Enzymes: LastLabs  No results for input(s): CKTOTAL, CKMB, CKMBINDEX, TROPONINI in the last 168 hours.   BNP: LastLabs  Invalid input(s): POCBNP   CBG: LastLabs  No results for input(s): GLUCAP in the last 168 hours.   D-Dimer RecentLabs(last2labs)  No results for input(s): DDIMER in the last 72 hours.   Hgb A1c RecentLabs(last2labs)  No results for input(s): HGBA1C in the last 72 hours.   Lipid Profile RecentLabs(last2labs)  No results for input(s): CHOL, HDL, LDLCALC, TRIG, CHOLHDL, LDLDIRECT in the last 72 hours.   Thyroid function studies  RecentLabs(last2labs)  No results for input(s): TSH, T4TOTAL, T3FREE, THYROIDAB in the last 72 hours.  Invalid input(s): FREET3   Anemia work up RecentLabs(last2labs)  No results for input(s): VITAMINB12, FOLATE, FERRITIN, TIBC, IRON, RETICCTPCT in the last 72 hours.   Urinalysis Labs(Brief)          Component Value Date/Time   COLORURINE AMBER (A) 06/21/2017 1845   APPEARANCEUR CLOUDY (A) 06/21/2017 1845   LABSPEC 1.023 06/21/2017 1845   PHURINE 5.0 06/21/2017 1845   GLUCOSEU NEGATIVE 06/21/2017 1845   HGBUR LARGE (A) 06/21/2017 1845   BILIRUBINUR NEGATIVE 06/21/2017 1845   BILIRUBINUR negative 06/03/2017 1120   KETONESUR 5 (A) 06/21/2017 1845   PROTEINUR 100 (A) 06/21/2017 1845   UROBILINOGEN 0.2  06/03/2017 1120   UROBILINOGEN 0.2 03/15/2017 1004   NITRITE NEGATIVE 06/21/2017 1845   LEUKOCYTESUR SMALL (A) 06/21/2017 1845     Sepsis Labs LastLabs  Invalid input(s): PROCALCITONIN,  WBC,  LACTICIDVEN   Microbiology        Recent Results (from the past 240 hour(s))  Culture, Urine     Status: Abnormal   Collection Time: 06/21/17  6:45 PM  Result Value Ref Range Status   Specimen Description   Final    URINE, CLEAN CATCH Performed at [facility], [place] [address], [place], [place] [address]    Special Requests   Final    NONE Performed at [facility], [place] [address], [place], [place] [address]    Culture >=100,000 COLONIES/mL ESCHERICHIA COLI (A)  Final   Report Status 06/24/2017 FINAL  Final   Organism ID, Bacteria ESCHERICHIA COLI (A)  Final      Susceptibility   Escherichia coli - MIC*    AMPICILLIN 4 SENSITIVE Sensitive     CEFAZOLIN <=4 SENSITIVE Sensitive     CEFTRIAXONE <=1 SENSITIVE Sensitive     CIPROFLOXACIN <=0.25 SENSITIVE Sensitive     GENTAMICIN <=1 SENSITIVE Sensitive     IMIPENEM <=0.25 SENSITIVE Sensitive     NITROFURANTOIN <=16 SENSITIVE Sensitive     TRIMETH/SULFA <=20 SENSITIVE Sensitive     AMPICILLIN/SULBACTAM <=2 SENSITIVE Sensitive     PIP/TAZO <=4 SENSITIVE Sensitive     Extended ESBL NEGATIVE Sensitive     * >=100,000 COLONIES/mL ESCHERICHIA COLI     Time coordinating discharge: 45 minutes  SIGNED:   Tawni Millers, MD      [facility] 06/28/2017, 11:45 AM Pager [phone]  If 7PM-7AM, please contact night-coverage [email or web address] Password [email or web address]       Revision History

## 2017-06-28 NOTE — Progress Notes (Signed)
Nutrition Follow-up  DOCUMENTATION CODES:   Severe malnutrition in context of chronic illness, Severe malnutrition in context of social or environmental circumstances, Underweight  INTERVENTION:    Boost Breeze po once daily, each supplement provides 250 kcal and 9 grams of protein  Juven Fruit Punch BID, each serving provides 95kcal and 2.5g of protein (amino acids glutamine and arginine)  Magic cup TID with meals, each supplement provides 290 kcal and 9 grams of protein  Recommend providing pt with folic acid supplementation due to previous alcohol abuse  NUTRITION DIAGNOSIS:   Severe Malnutrition related to chronic illness, social / environmental circumstances(chronic pancreatitis, COPD, drug use and drug-seeking behaviors.) as evidenced by severe fat depletion, severe muscle depletion.  Ongoing  GOAL:   Patient will meet greater than or equal to 90% of their needs  Progressing  MONITOR:   PO intake, Supplement acceptance, Diet advancement, Weight trends, Labs  REASON FOR ASSESSMENT:   Malnutrition Screening Tool    ASSESSMENT:   67 y.o. female with medical history significant for chronic alcoholic pancreatitis, renal cancer, gastric ulcer, COPD GOLD stage III, HTN, alcohol withdrawal, benzodiazepine dependence, and drug-seeking behavior. She was brought to the ED via EMS with reports of abdominal pain for ~1 week. She denies vomiting or nausea, but reports loose stools 3 times/day x3 days, non-bloody, and burning with urination during this time frame. Patient reports chronic unchanged SOB, on home O2 as needed, She had also presented to the ED on 6/16 for abdominal pain and CXR was negative for acute abnormality. Patient was given IV fluids and anti-emetics and discharged home. Patient reports since ED visit she has not eaten anything, abdominal pain has persisted. This is patient's 5th ED visit this month and 12th ED visit this year; one hospital admission this year.     6/18- clear liquid diet 6/19- regular diet  Pt reports appetite has increased slowly each day. Meal completions charted as 25-50% for her last 3 meals. RD observed lunch tray at bedside with pork, greens, and potatoes, pt finished around 25%. Pt does not like the taste of Boost but forces herself to drink 0.5-1 each day. RD to decrease the quantity she receives. Pt willing to try Magic cups to maximize calories and protein.   A recent wt has not been obtained since last RD visit. Initial plan was for pt to go to short term SNF but has been difficult to place due to prescribed subutex. Will monitor for plan.   Medications reviewed and include: MVI with minerals, thaimine  Labs reviewed.   Diet Order:   Diet Order           Diet regular Room service appropriate? No; Fluid consistency: Thin  Diet effective now          EDUCATION NEEDS:   Not appropriate for education at this time  Skin:  Skin Assessment: Skin Integrity Issues: Skin Integrity Issues:: Stage I, Stage II Stage I: bilateral heels, bilateral elbows, sacrum Stage II: R IT  Last BM:  06/27/17  Height:   Ht Readings from Last 1 Encounters:  06/21/17 5\' 4"  (1.626 m)    Weight:   Wt Readings from Last 1 Encounters:  06/21/17 67 lb 14.4 oz (30.8 kg)    Ideal Body Weight:  54.54 kg  BMI:  Body mass index is 11.66 kg/m.  Estimated Nutritional Needs:   Kcal:  1365-1640 (25-30 kcal/kg IBW)  Protein:  65-76 grams (1.2-1.4 grams/kg IBW)  Fluid:  >/= 1.5 L/day  Mariana Single RD, LDN Clinical Nutrition Pager # [phone]

## 2017-06-30 NOTE — Telephone Encounter (Signed)
Patient notified

## 2017-06-30 NOTE — Telephone Encounter (Signed)
-----   Message from Azzie Glatter, [place] sent at 06/30/2017 12:20 AM EDT ----- Regarding: "Hypocalcemia" [name],   Please contact patient and let her know that her Calcium levels are low since last Hospital visit.  She will need to add OTC Calcium tablet a day.   Follow up as scheduled.   Thanks!

## 2017-07-08 ENCOUNTER — Encounter (INDEPENDENT_AMBULATORY_CARE_PROVIDER_SITE_OTHER): Payer: Self-pay | Admitting: Orthopaedic Surgery

## 2017-07-08 ENCOUNTER — Ambulatory Visit (INDEPENDENT_AMBULATORY_CARE_PROVIDER_SITE_OTHER): Payer: Medicaid Other | Admitting: Orthopaedic Surgery

## 2017-07-08 ENCOUNTER — Other Ambulatory Visit (INDEPENDENT_AMBULATORY_CARE_PROVIDER_SITE_OTHER): Payer: Self-pay | Admitting: Radiology

## 2017-07-08 VITALS — Ht 64.0 in | Wt <= 1120 oz

## 2017-07-08 DIAGNOSIS — G8929 Other chronic pain: Secondary | ICD-10-CM

## 2017-07-08 DIAGNOSIS — M5441 Lumbago with sciatica, right side: Principal | ICD-10-CM

## 2017-07-08 DIAGNOSIS — M5442 Lumbago with sciatica, left side: Secondary | ICD-10-CM | POA: Diagnosis not present

## 2017-07-08 NOTE — Progress Notes (Signed)
Office Visit Note   Patient: [name]           Date of Birth: 05-11-1950           MRN: [id number] Visit Date: 07/08/2017              Requested by: Azzie Glatter, [place], [place] [address] PCP: Azzie Glatter, FNP   Assessment & Plan: Visit Diagnoses:  1. Chronic bilateral low back pain with bilateral sciatica     Plan: Chronic, persistent low back pain with referred discomfort to both thighs.  I still think an MRI of the lumbar spine is appropriate given the chronicity of her pain, the referred discomfort in her poor response to medicines at the time.  She presently is involved in a pain clinic taking Suboxone.  Follow-Up Instructions: Return after MRI L-S spine.   Orders:  No orders of the defined types were placed in this encounter.  No orders of the defined types were placed in this encounter.     Procedures: No procedures performed   Clinical Data: No additional findings.   Subjective: Chief Complaint  Patient presents with  . Follow-up    BACK PAIN FOLLOW UP FOR MRI PEER TO PEER, FELL THIS MORNING IN LIVING ROOM   [name] is again accompanied by her husband here for follow-up evaluation of her chronic low back pain.  I last saw her in March for evaluation of chronic pain.  I ordered an MRI scan of her lumbar spine which was denied.  In the interim she has been followed in a pain clinic and presently is on Suboxone.  He was admitted to the hospital within the past 3 months for acute acute exacerbation of her back discomfort.  She was noted to have a urinary tract infection which was treated.  She continues to complain of back discomfort with referred pain to both thighs.  She really is not much better despite time and medicines.  Her prior films demonstrate significant degenerative changes at L5-S1.  I am concerned that she may have spinal stenosis.  She is had a number of falls which have aggravated her back pain. At one point  she was on Ativan for "her nerves "  HPI  Review of Systems  Constitutional: Positive for fatigue. Negative for fever.  HENT: Negative for ear pain.   Eyes: Negative for pain.  Respiratory: Positive for cough and shortness of breath.   Cardiovascular: Negative for leg swelling.  Gastrointestinal: Negative for constipation and diarrhea.  Genitourinary: Positive for difficulty urinating.  Musculoskeletal: Positive for back pain. Negative for neck pain.  Skin: Negative for rash.  Allergic/Immunologic: Negative for food allergies.  Neurological: Positive for weakness and numbness.  Hematological: Bruises/bleeds easily.  Psychiatric/Behavioral: Positive for sleep disturbance.     Objective: Vital Signs: Ht 5\' 4"  (1.626 m)   Wt 70 lb (31.8 kg)   BMI 12.02 kg/m   Physical Exam  Ortho Exam awake alert and oriented x3.  Which shakes throughout the examination.  I think her memory is a little altered as she had a difficult time with dates and details of her recent hospitalization.  Her husband was able to fill in the blanks.  She is extremely emaciated and thin.  She did have percussible tenderness throughout the lumbar spine and at the lumbosacral junction.  Painless range of motion of both hips.  Feet were warm.  Motor exam intact. Specialty Comments:  No  specialty comments available.  Imaging: No results found.   PMFS History: Patient Active Problem List   Diagnosis Date Noted  . Failure to thrive in adult 06/21/2017  . Pressure injury of skin 04/06/2017  . Pressure injury of skin of right ischial tuberosity region: Stage 2 04/06/2017  . Acute urinary retention 04/04/2017  . Benzodiazepine dependence ([place])   . AKI (acute kidney injury) ([place]) 04/03/2017  . Hip fracture, right ([place]) 06/02/2016  . Narcotic addiction ([place]) 02/29/2016  . Abdominal pain, chronic, epigastric- due to chronic pancreatitis 02/29/2016  . COPD (chronic obstructive pulmonary disease) ([place]) 02/28/2016  .  Cough 09/03/2015  . Abdominal pain 01/03/2015  . Hyponatremia 01/03/2015  . Hypocalcemia 01/03/2015  . Underweight 01/03/2015  . COPD GOLD III with reversible component  07/17/2014  . DTs (delirium tremens) ([place]) 06/02/2013  . Chronic alcoholic pancreatitis ([place]) 06/02/2013  . Lactic acidosis 06/02/2013  . Recurrent acute pancreatitis 03/28/2013  . Alcohol withdrawal ([place]) 03/28/2013  . Pancreatitis 03/09/2013  . Tobacco abuse 03/09/2013  . Alcohol withdrawal syndrome with perceptual disturbance ([place]) 03/09/2013  . Acute alcoholic pancreatitis 17/61/6073  . Benzodiazepine withdrawal ([place]) 02/09/2013  . Protein-calorie malnutrition, severe ([place]) 02/09/2013  . Hypokalemia 02/27/2011  . Abdominal pain, acute 02/25/2011  . Nausea and vomiting 02/25/2011  . Thrombocytopenia ([place]) 02/25/2011  . COPD with acute exacerbation ([place]) 02/25/2011  . HTN (hypertension), malignant 02/25/2011  . KIDNEY CANCER 10/18/2008  . Anxiety state 10/18/2008  . GERD 10/18/2008  . Hepatic cirrhosis ([place]) 10/18/2008  . PANCREATITIS, CHRONIC- atrophic pancreas 10/18/2008  . DYSPNEA 10/18/2008  . GASTRIC ULCER, HX OF 10/18/2008   Past Medical History:  Diagnosis Date  . Cancer ([place])    renal ca  . COPD (chronic obstructive pulmonary disease) ([place])   . Drug-seeking behavior   . Pancreatitis   . Pancreatitis   . Seizures ([place])   . Substance abuse ([place])     Family History  Problem Relation Age of Onset  . CAD Mother   . Alcoholism Father   . COPD Father     Past Surgical History:  Procedure Laterality Date  . IR GENERIC HISTORICAL  05/08/2015   IR RADIOLOGIST EVAL & MGMT 05/08/2015 Aletta Edouard, MD [facility] INTERV RAD  . IR GENERIC HISTORICAL  01/08/2014   IR RADIOLOGIST EVAL & MGMT 01/08/2014 Aletta Edouard, MD [facility] INTERV RAD  . KIDNEY SURGERY     removed cancerous lesions  . PARTIAL GASTRECTOMY     Social History   Occupational History  . Not on file  Tobacco Use  . Smoking status: Current  Every Day Smoker    Packs/day: 1.00    Years: 30.00    Pack years: 30.00    Types: Cigarettes  . Smokeless tobacco: Never Used  . Tobacco comment: smoking up to 1.5 ppd per husband.   Substance and Sexual Activity  . Alcohol use: Not Currently    Alcohol/week: 0.0 oz  . Drug use: No    Comment: Hx of polysubstance drug abuse  . Sexual activity: Never

## 2017-07-11 ENCOUNTER — Emergency Department (HOSPITAL_COMMUNITY)
Admission: EM | Admit: 2017-07-11 | Discharge: 2017-07-11 | Disposition: A | Discharge status: Home / Self Care | Payer: Medicaid Other | Attending: Emergency Medicine | Admitting: Emergency Medicine

## 2017-07-11 ENCOUNTER — Other Ambulatory Visit: Payer: Self-pay

## 2017-07-11 ENCOUNTER — Emergency Department (EMERGENCY_DEPARTMENT_HOSPITAL)
Admission: EM | Admit: 2017-07-11 | Discharge: 2017-07-12 | Disposition: A | Discharge status: Home / Self Care | Payer: Medicaid Other | Source: Home / Self Care | Attending: Emergency Medicine | Admitting: Emergency Medicine

## 2017-07-11 ENCOUNTER — Emergency Department (HOSPITAL_COMMUNITY): Payer: Medicaid Other

## 2017-07-11 ENCOUNTER — Encounter (HOSPITAL_COMMUNITY): Payer: Self-pay

## 2017-07-11 DIAGNOSIS — G894 Chronic pain syndrome: Secondary | ICD-10-CM

## 2017-07-11 DIAGNOSIS — Z008 Encounter for other general examination: Secondary | ICD-10-CM | POA: Diagnosis present

## 2017-07-11 DIAGNOSIS — J449 Chronic obstructive pulmonary disease, unspecified: Secondary | ICD-10-CM | POA: Insufficient documentation

## 2017-07-11 DIAGNOSIS — F17210 Nicotine dependence, cigarettes, uncomplicated: Secondary | ICD-10-CM | POA: Diagnosis not present

## 2017-07-11 DIAGNOSIS — I10 Essential (primary) hypertension: Secondary | ICD-10-CM | POA: Diagnosis not present

## 2017-07-11 DIAGNOSIS — Z85528 Personal history of other malignant neoplasm of kidney: Secondary | ICD-10-CM | POA: Insufficient documentation

## 2017-07-11 DIAGNOSIS — F411 Generalized anxiety disorder: Secondary | ICD-10-CM | POA: Diagnosis not present

## 2017-07-11 DIAGNOSIS — F19950 Other psychoactive substance use, unspecified with psychoactive substance-induced psychotic disorder with delusions: Secondary | ICD-10-CM | POA: Diagnosis present

## 2017-07-11 DIAGNOSIS — Z79899 Other long term (current) drug therapy: Secondary | ICD-10-CM | POA: Insufficient documentation

## 2017-07-11 DIAGNOSIS — F1120 Opioid dependence, uncomplicated: Secondary | ICD-10-CM

## 2017-07-11 DIAGNOSIS — R627 Adult failure to thrive: Secondary | ICD-10-CM

## 2017-07-11 DIAGNOSIS — D649 Anemia, unspecified: Secondary | ICD-10-CM

## 2017-07-11 LAB — CBC WITH DIFFERENTIAL/PLATELET
BASOS ABS: 0 10*3/uL (ref 0.0–0.1)
BASOS PCT: 0 %
Eosinophils Absolute: 0 10*3/uL (ref 0.0–0.7)
Eosinophils Relative: 0 %
HEMATOCRIT: 27.9 % — AB (ref 36.0–46.0)
HEMOGLOBIN: 9.4 g/dL — AB (ref 12.0–15.0)
LYMPHS PCT: 9 %
Lymphs Abs: 1.1 10*3/uL (ref 0.7–4.0)
MCH: 30.8 pg (ref 26.0–34.0)
MCHC: 33.7 g/dL (ref 30.0–36.0)
MCV: 91.5 fL (ref 78.0–100.0)
Monocytes Absolute: 1 10*3/uL (ref 0.1–1.0)
Monocytes Relative: 9 %
NEUTROS ABS: 9.4 10*3/uL — AB (ref 1.7–7.7)
NEUTROS PCT: 82 %
PLATELETS: 317 10*3/uL (ref 150–400)
RBC: 3.05 MIL/uL — AB (ref 3.87–5.11)
RDW: 14.3 % (ref 11.5–15.5)
WBC: 11.4 10*3/uL — ABNORMAL HIGH (ref 4.0–10.5)

## 2017-07-11 LAB — URINALYSIS, ROUTINE W REFLEX MICROSCOPIC
Bilirubin Urine: NEGATIVE
Glucose, UA: NEGATIVE mg/dL
Ketones, ur: NEGATIVE mg/dL
Nitrite: NEGATIVE
PROTEIN: NEGATIVE mg/dL
Specific Gravity, Urine: 1.003 — ABNORMAL LOW (ref 1.005–1.030)
pH: 7 (ref 5.0–8.0)

## 2017-07-11 LAB — COMPREHENSIVE METABOLIC PANEL
ALBUMIN: 3.6 g/dL (ref 3.5–5.0)
ALK PHOS: 68 U/L (ref 38–126)
ALT: 14 U/L (ref 0–44)
ANION GAP: 11 (ref 5–15)
AST: 22 U/L (ref 15–41)
BILIRUBIN TOTAL: 0.3 mg/dL (ref 0.3–1.2)
BUN: 13 mg/dL (ref 8–23)
CALCIUM: 9.2 mg/dL (ref 8.9–10.3)
CO2: 21 mmol/L — ABNORMAL LOW (ref 22–32)
Chloride: 101 mmol/L (ref 98–111)
Creatinine, Ser: 0.88 mg/dL (ref 0.44–1.00)
GFR calc non Af Amer: >60 mL/min (ref >60)
Glucose, Bld: 89 mg/dL (ref 70–99)
POTASSIUM: 3.8 mmol/L (ref 3.5–5.1)
SODIUM: 133 mmol/L — AB (ref 135–145)
TOTAL PROTEIN: 6.7 g/dL (ref 6.5–8.1)

## 2017-07-11 LAB — RAPID URINE DRUG SCREEN, HOSP PERFORMED
Amphetamines: NOT DETECTED
BENZODIAZEPINES: NOT DETECTED
COCAINE: NOT DETECTED
OPIATES: NOT DETECTED
Tetrahydrocannabinol: NOT DETECTED

## 2017-07-11 LAB — SALICYLATE LEVEL

## 2017-07-11 LAB — ETHANOL: Alcohol, Ethyl (B): <10 mg/dL (ref <10)

## 2017-07-11 LAB — ACETAMINOPHEN LEVEL

## 2017-07-11 MED ORDER — ACETAMINOPHEN 325 MG PO TABS: 650.0000 mg | ORAL_TABLET | Freq: Four times a day (QID) | ORAL | Status: DC | PRN

## 2017-07-11 MED ORDER — TIOTROPIUM BROMIDE MONOHYDRATE 18 MCG IN CAPS
18.0000 ug | ORAL_CAPSULE | Freq: Every day | RESPIRATORY_TRACT | Status: DC
  Administered 2017-07-12: 18 ug via RESPIRATORY_TRACT
  Filled 2017-07-11: qty 5

## 2017-07-11 MED ORDER — SODIUM CHLORIDE 0.9 % IV BOLUS: 500.0000 mL | Freq: Once | INTRAVENOUS | Status: DC

## 2017-07-11 MED ORDER — ACETAMINOPHEN 325 MG PO TABS
650.0000 mg | ORAL_TABLET | Freq: Once | ORAL | Status: DC
  Filled 2017-07-11: qty 2

## 2017-07-11 MED ORDER — SODIUM CHLORIDE 0.9 % IV SOLN: INTRAVENOUS | Status: DC

## 2017-07-11 MED ORDER — ONDANSETRON 8 MG PO TBDP
8.0000 mg | ORAL_TABLET | Freq: Once | ORAL | Status: AC
  Administered 2017-07-11: 8 mg via ORAL
  Filled 2017-07-11: qty 1

## 2017-07-11 NOTE — ED Notes (Signed)
Repeat lab work attempted was unsuccessful.

## 2017-07-11 NOTE — ED Notes (Signed)
Pt refused for writer to change in scrubs.

## 2017-07-11 NOTE — ED Notes (Signed)
Patient calling husband for transport home.

## 2017-07-11 NOTE — ED Notes (Signed)
Pt refused Vitals signs.

## 2017-07-11 NOTE — ED Triage Notes (Addendum)
Patient was brought in by [facility] officers x 2. IVC'd by husband. IVC paperwork states taking a lot of suboxone, not eating, sleeping, or taking care of personal hygiene. Also states that the patient is talking to family members who are not present and talking about wanting to die. Patient also smokes and drops cigarettes on the floor and abuses prescription drugs daily. IVC also states that the patient voices that she wants to die.

## 2017-07-11 NOTE — ED Triage Notes (Addendum)
Patient was brought in by [facility] officers x 2. Patient's husband called and wanted the patient "to be committed." Patient's husband stated to [employer] that the patient's counselor/Ted Bissette wanted the patient IVC'd. (counselor # [phone])  Patient reports that she has been taking more suboxone than ordered 1or 2 times. Patient denies any other drugs or alcohol. Patient denies SI/HI, visual or auditory hallucinations. Patient reports that she does not know why the counselor wanted her to come to the ED.

## 2017-07-11 NOTE — ED Provider Notes (Signed)
[facility] DEPT Provider Note   CSN: [id number] Arrival date & time: 07/11/17  1514     History   Chief Complaint Chief Complaint  Patient presents with  . Medical Clearance    HPI [name] is a 67 y.o. female.  HPI   Patient presents by law enforcement, brought from her counselor's office where she was arguing with staff members there, regarding use of narcotics, possible detoxification and she was felt to be confused so she was brought here.  Patient states she does not know why she is here, and denies she is taking Suboxone.  She states that she has pain in her lower ribs bilaterally that is ongoing and she needs something for it.  She reports coughing and producing sputum as usual.  She continues to smoke cigarettes.  She denies using alcohol or illegal drugs.  She denies fever, chills, nausea, vomiting, weakness or dizziness.  She is in the ED, 3 week ago for chronic pain.  She was admitted to the hospital at that time.  She was treated symptomatically and had medical evaluation.  She was felt to be severely malnourished.  Patient is unable to give any other additional history.  There are no other known modifying factors.  Past Medical History:  Diagnosis Date  . Cancer ([place])    renal ca  . COPD (chronic obstructive pulmonary disease) ([place])   . Drug-seeking behavior   . Pancreatitis   . Pancreatitis   . Seizures ([place])   . Substance abuse [facility])     Patient Active Problem List   Diagnosis Date Noted  . Failure to thrive in adult 06/21/2017  . Pressure injury of skin 04/06/2017  . Pressure injury of skin of right ischial tuberosity region: Stage 2 04/06/2017  . Acute urinary retention 04/04/2017  . Benzodiazepine dependence ([place])   . AKI (acute kidney injury) ([place]) 04/03/2017  . Hip fracture, right ([place]) 06/02/2016  . Narcotic addiction ([place]) 02/29/2016  . Abdominal pain, chronic, epigastric- due to chronic pancreatitis 02/29/2016    . COPD (chronic obstructive pulmonary disease) ([place]) 02/28/2016  . Cough 09/03/2015  . Abdominal pain 01/03/2015  . Hyponatremia 01/03/2015  . Hypocalcemia 01/03/2015  . Underweight 01/03/2015  . COPD GOLD III with reversible component  07/17/2014  . DTs (delirium tremens) ([place]) 06/02/2013  . Chronic alcoholic pancreatitis ([place]) 06/02/2013  . Lactic acidosis 06/02/2013  . Recurrent acute pancreatitis 03/28/2013  . Alcohol withdrawal ([place]) 03/28/2013  . Pancreatitis 03/09/2013  . Tobacco abuse 03/09/2013  . Alcohol withdrawal syndrome with perceptual disturbance ([place]) 03/09/2013  . Acute alcoholic pancreatitis 19/14/7829  . Benzodiazepine withdrawal ([place]) 02/09/2013  . Protein-calorie malnutrition, severe ([place]) 02/09/2013  . Hypokalemia 02/27/2011  . Abdominal pain, acute 02/25/2011  . Nausea and vomiting 02/25/2011  . Thrombocytopenia ([place]) 02/25/2011  . COPD with acute exacerbation ([place]) 02/25/2011  . HTN (hypertension), malignant 02/25/2011  . KIDNEY CANCER 10/18/2008  . Anxiety state 10/18/2008  . GERD 10/18/2008  . Hepatic cirrhosis ([place]) 10/18/2008  . PANCREATITIS, CHRONIC- atrophic pancreas 10/18/2008  . DYSPNEA 10/18/2008  . GASTRIC ULCER, HX OF 10/18/2008    Past Surgical History:  Procedure Laterality Date  . IR GENERIC HISTORICAL  05/08/2015   IR RADIOLOGIST EVAL & MGMT 05/08/2015 Aletta Edouard, MD [facility] INTERV RAD  . IR GENERIC HISTORICAL  01/08/2014   IR RADIOLOGIST EVAL & MGMT 01/08/2014 Aletta Edouard, MD [facility] INTERV RAD  . KIDNEY SURGERY     removed cancerous lesions  .  PARTIAL GASTRECTOMY       OB History   None      Home Medications    Prior to Admission medications   Medication Sig Start Date End Date Taking? Authorizing Provider  acetaminophen (TYLENOL) 325 MG tablet Take 2 tablets (650 mg total) by mouth every 6 (six) hours as needed for moderate pain (use first). Patient not taking: Reported on 07/11/2017 06/28/17   Arrien, Jimmy Picket, MD   ibuprofen (ADVIL,MOTRIN) 400 MG tablet Take 1 tablet (400 mg total) by mouth every 6 (six) hours as needed for moderate pain (back pain. if tylenol ineffective). Patient not taking: Reported on 07/11/2017 06/28/17   Arrien, Jimmy Picket, MD  mometasone-formoterol [facility] 200-5 MCG/ACT AERO Inhale 2 puffs into the lungs 2 (two) times daily. 06/28/17 07/28/17  Arrien, Jimmy Picket, MD  Multiple Vitamin (MULTIVITAMIN WITH MINERALS) TABS tablet Take 1 tablet by mouth daily. Patient not taking: Reported on 07/11/2017 06/29/17 07/29/17  Arrien, Jimmy Picket, MD  nutrition supplement, JUVEN, Fanny Dance) PACK Take 1 packet by mouth 2 (two) times daily between meals. Patient not taking: Reported on 07/08/2017 06/28/17 07/28/17  Arrien, Jimmy Picket, MD  ondansetron (ZOFRAN) 4 MG tablet Take 1 tablet (4 mg total) by mouth every 6 (six) hours as needed for nausea. Patient not taking: Reported on 07/11/2017 06/28/17   Arrien, Jimmy Picket, MD  pantoprazole (PROTONIX) 40 MG tablet Take 1 tablet (40 mg total) by mouth daily. Patient not taking: Reported on 07/08/2017 06/28/17 07/28/17  Arrien, Jimmy Picket, MD  tiotropium (SPIRIVA HANDIHALER) 18 MCG inhalation capsule Place 1 capsule (18 mcg total) into inhaler and inhale daily. Patient not taking: Reported on 07/11/2017 06/29/17   Arrien, Jimmy Picket, MD    Family History Family History  Problem Relation Age of Onset  . CAD Mother   . Alcoholism Father   . COPD Father     Social History Social History   Tobacco Use  . Smoking status: Current Every Day Smoker    Packs/day: 1.00    Years: 30.00    Pack years: 30.00    Types: Cigarettes  . Smokeless tobacco: Never Used  . Tobacco comment: smoking up to 1.5 ppd per husband.   Substance Use Topics  . Alcohol use: Not Currently    Alcohol/week: 0.0 oz  . Drug use: No    Comment: Hx of polysubstance drug abuse     Allergies   Aspirin and Chlorpromazine hcl   Review of Systems Review of  Systems  All other systems reviewed and are negative.    Physical Exam Updated Vital Signs BP 118/67 (BP Location: Right Arm)   Pulse (!) 120   Temp 98.6 F (37 C) (Oral)   Resp 20   Ht 5\' 4"  (1.626 m)   Wt 31.8 kg (70 lb)   SpO2 96%   BMI 12.02 kg/m   Physical Exam  Constitutional: She is oriented to person, place, and time. She appears well-developed.  Malnourished appearing, underweight.  Appears much older than stated age.  HENT:  Head: Normocephalic and atraumatic.  Eyes: Pupils are equal, round, and reactive to light. Conjunctivae and EOM are normal.  Neck: Normal range of motion and phonation normal. Neck supple.  Cardiovascular: Normal rate.  Pulmonary/Chest: Effort normal. No respiratory distress.  Musculoskeletal: Normal range of motion.  Normal gait  Neurological: She is alert and oriented to person, place, and time. She exhibits normal muscle tone.  Skin: Skin is warm and dry.  Psychiatric: She has  a normal mood and affect. Her behavior is normal. Judgment and thought content normal.  Nursing note and vitals reviewed.    ED Treatments / Results  Labs (all labs ordered are listed, but only abnormal results are displayed) Labs Reviewed  ACETAMINOPHEN LEVEL - Abnormal; Notable for the following components:      Result Value   Acetaminophen (Tylenol), Serum <10 (*)    All other components within normal limits  RAPID URINE DRUG SCREEN, HOSP PERFORMED - Abnormal; Notable for the following components:   Barbiturates   (*)    Value: Result not available. Reagent lot number recalled by manufacturer.   All other components within normal limits  URINALYSIS, ROUTINE W REFLEX MICROSCOPIC - Abnormal; Notable for the following components:   Color, Urine STRAW (*)    Specific Gravity, Urine 1.003 (*)    Hgb urine dipstick MODERATE (*)    Leukocytes, UA LARGE (*)    Bacteria, UA RARE (*)    All other components within normal limits  ETHANOL  SALICYLATE LEVEL     EKG None  Radiology Dg Chest 2 View  Result Date: 07/11/2017 CLINICAL DATA:  Cough, lower chest pain. History of substance abuse and COPD. EXAM: CHEST - 2 VIEW COMPARISON:  Chest radiograph June 19, 2017 and CT chest January 15, 2017 FINDINGS: Cardiomediastinal silhouette is normal. Ovoid nodule projecting over LEFT heart, possibly and LEFT lower lobe. No pleural effusions or focal consolidations. Mild chronic interstitial change of the increased lung volumes, flattened hemidiaphragms. Trachea projects midline and there is no pneumothorax. Soft tissue planes and included osseous structures are non-suspicious. Old LEFT rib fractures. Cachexia. Gas distended bowel in the included abdomen. IMPRESSION: COPD.  No acute cardiopulmonary process. Nodule projecting LEFT lung base without corresponding abnormality on prior CT. Recommend follow up CT chest with contrast on non emergent basis. Aortic Atherosclerosis (ICD10-I70.0).  Emphysema (ICD10-J43.9). Electronically Signed   By: Elon Alas M.D.   On: 07/11/2017 16:57    Procedures Procedures (including critical care time)  Medications Ordered in ED Medications  ondansetron (ZOFRAN-ODT) disintegrating tablet 8 mg (8 mg Oral Given 07/11/17 1713)     Initial Impression / Assessment and Plan / ED Course  I have reviewed the triage vital signs and the nursing notes.  Pertinent labs & imaging results that were available during my care of the patient were reviewed by me and considered in my medical decision making (see chart for details).  Clinical Course as of Jul 11 2348  Mon Jul 11, 2017  1647 Seen today. Confusion and agitated today. Did I talked to a counselor, Sherren Kerns, who saw her today. This was the second visit, referred from [facility] for general counseling. She had been seeing Dr. Alyson Ingles, who is no longer practicing. She was started on Suboxone about 1 month ago, by pain management. Today, her husband brought her to the  counselor c/o taing too much pain medicine, and needing commitment." The patient didn't know what day it was per Mr. Marga Melnick.. Started on suboxone 1 week ago. Assessment visit  1 week ago by counselor and then today for urgent intervention. He reports  "she took all of her suboxone, so husband brought her here. " Also, her husband told the counselor that they had been arguing, and that he can't help her because she is out of it.   [EW]    Clinical Course User Index [EW] Daleen Bo, MD     Patient Vitals for the past 24  hrs:  BP Temp Temp src Pulse Resp SpO2 Height Weight  07/11/17 1528 - - - - - - 5\' 4"  (1.626 m) 31.8 kg (70 lb)  07/11/17 1522 118/67 98.6 F (37 C) Oral (!) 120 20 96 % - -   Patient left AMA prior to getting labs drawn, after she was offered Tylenol for pain.  She told the nurse that she did not want to take Tylenol.  Medical Decision Making: Chronic pain, with narcotics addiction and possible misuse of narcotic medication.  No overt clinical instability, at the time she was initially seen therefore there is no indication to force her to stay when she chose to leave.  CRITICAL CARE-no Performed by: Daleen Bo   Nursing Notes Reviewed/ Care Coordinated Applicable Imaging Reviewed Interpretation of Laboratory Data incorporated into ED treatment  AMA    Final Clinical Impressions(s) / ED Diagnoses   Final diagnoses:  Chronic pain syndrome    ED Discharge Orders    None       Daleen Bo, MD 07/11/17 2350

## 2017-07-11 NOTE — ED Notes (Signed)
Pt belongings placed in bag, secure at triage nurse station--1 bag

## 2017-07-11 NOTE — ED Notes (Signed)
Blood draw attempt x2 unsuccessful.  RN notfied.  Pt also sts she would like something for nausea and pain.
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
## 2017-07-11 NOTE — ED Notes (Signed)
Patient stated "I want to leave." Dr. Eulis Foster notified.

## 2017-07-11 NOTE — ED Provider Notes (Signed)
[facility] DEPT Provider Note   CSN: [id number] Arrival date & time: 07/11/17  1832     History   Chief Complaint Chief Complaint  Patient presents with  . IVC    HPI Janice CANNELLA is a 67 y.o. female.  HPI   Patient presents for evaluation of assessment of psychiatric stability.  Since being here earlier and leaving [place], the patient's husband petitioned her for commitment and it was signed by the magistrate.  He alleges that she is taking her medications inappropriately and not taking care of herself.  He states that she is thinking about dying.   Patient denies suicidal ideation to me.  She is mildly confused.  She is aware of where she is, who she is, and who I am.  Patient is not responding to internal stimuli.  She states that she does not know what type of pain medicine that she takes.  There are no other known modifying factors.  Past Medical History:  Diagnosis Date  . Cancer ([place])    renal ca  . COPD (chronic obstructive pulmonary disease) ([place])   . Drug-seeking behavior   . Pancreatitis   . Pancreatitis   . Seizures ([place])   . Substance abuse [facility])     Patient Active Problem List   Diagnosis Date Noted  . Failure to thrive in adult 06/21/2017  . Pressure injury of skin 04/06/2017  . Pressure injury of skin of right ischial tuberosity region: Stage 2 04/06/2017  . Acute urinary retention 04/04/2017  . Benzodiazepine dependence ([place])   . AKI (acute kidney injury) ([place]) 04/03/2017  . Hip fracture, right ([place]) 06/02/2016  . Narcotic addiction ([place]) 02/29/2016  . Abdominal pain, chronic, epigastric- due to chronic pancreatitis 02/29/2016  . COPD (chronic obstructive pulmonary disease) ([place]) 02/28/2016  . Cough 09/03/2015  . Abdominal pain 01/03/2015  . Hyponatremia 01/03/2015  . Hypocalcemia 01/03/2015  . Underweight 01/03/2015  . COPD GOLD III with reversible component  07/17/2014  . DTs (delirium tremens)  ([name]) 06/02/2013  . Chronic alcoholic pancreatitis ([place]) 06/02/2013  . Lactic acidosis 06/02/2013  . Recurrent acute pancreatitis 03/28/2013  . Alcohol withdrawal ([place]) 03/28/2013  . Pancreatitis 03/09/2013  . Tobacco abuse 03/09/2013  . Alcohol withdrawal syndrome with perceptual disturbance ([place]) 03/09/2013  . Acute alcoholic pancreatitis 62/22/9798  . Benzodiazepine withdrawal ([place]) 02/09/2013  . Protein-calorie malnutrition, severe ([place]) 02/09/2013  . Hypokalemia 02/27/2011  . Abdominal pain, acute 02/25/2011  . Nausea and vomiting 02/25/2011  . Thrombocytopenia ([place]) 02/25/2011  . COPD with acute exacerbation ([place]) 02/25/2011  . HTN (hypertension), malignant 02/25/2011  . KIDNEY CANCER 10/18/2008  . Anxiety state 10/18/2008  . GERD 10/18/2008  . Hepatic cirrhosis ([place]) 10/18/2008  . PANCREATITIS, CHRONIC- atrophic pancreas 10/18/2008  . DYSPNEA 10/18/2008  . GASTRIC ULCER, HX OF 10/18/2008    Past Surgical History:  Procedure Laterality Date  . IR GENERIC HISTORICAL  05/08/2015   IR RADIOLOGIST EVAL & MGMT 05/08/2015 Aletta Edouard, MD [facility] INTERV RAD  . IR GENERIC HISTORICAL  01/08/2014   IR RADIOLOGIST EVAL & MGMT 01/08/2014 Aletta Edouard, MD [facility] INTERV RAD  . KIDNEY SURGERY     removed cancerous lesions  . PARTIAL GASTRECTOMY       OB History   None      Home Medications    Prior to Admission medications   Medication Sig Start Date End Date Taking? Authorizing Provider  acetaminophen (TYLENOL) 325 MG tablet Take 2 tablets (650  mg total) by mouth every 6 (six) hours as needed for moderate pain (use first). Patient not taking: Reported on 07/11/2017 06/28/17   Arrien, Jimmy Picket, MD  ibuprofen (ADVIL,MOTRIN) 400 MG tablet Take 1 tablet (400 mg total) by mouth every 6 (six) hours as needed for moderate pain (back pain. if tylenol ineffective). Patient not taking: Reported on 07/11/2017 06/28/17   Arrien, Jimmy Picket, MD  mometasone-formoterol [facility]) 200-5  MCG/ACT AERO Inhale 2 puffs into the lungs 2 (two) times daily. 06/28/17 07/28/17  Arrien, Jimmy Picket, MD  Multiple Vitamin (MULTIVITAMIN WITH MINERALS) TABS tablet Take 1 tablet by mouth daily. Patient not taking: Reported on 07/11/2017 06/29/17 07/29/17  Arrien, Jimmy Picket, MD  nutrition supplement, JUVEN, Fanny Dance) PACK Take 1 packet by mouth 2 (two) times daily between meals. Patient not taking: Reported on 07/08/2017 06/28/17 07/28/17  Arrien, Jimmy Picket, MD  ondansetron (ZOFRAN) 4 MG tablet Take 1 tablet (4 mg total) by mouth every 6 (six) hours as needed for nausea. Patient not taking: Reported on 07/11/2017 06/28/17   Arrien, Jimmy Picket, MD  pantoprazole (PROTONIX) 40 MG tablet Take 1 tablet (40 mg total) by mouth daily. Patient not taking: Reported on 07/08/2017 06/28/17 07/28/17  Arrien, Jimmy Picket, MD  tiotropium (SPIRIVA HANDIHALER) 18 MCG inhalation capsule Place 1 capsule (18 mcg total) into inhaler and inhale daily. Patient not taking: Reported on 07/11/2017 06/29/17   Arrien, Jimmy Picket, MD    Family History Family History  Problem Relation Age of Onset  . CAD Mother   . Alcoholism Father   . COPD Father     Social History Social History   Tobacco Use  . Smoking status: Current Every Day Smoker    Packs/day: 1.00    Years: 30.00    Pack years: 30.00    Types: Cigarettes  . Smokeless tobacco: Never Used  . Tobacco comment: smoking up to 1.5 ppd per husband.   Substance Use Topics  . Alcohol use: Not Currently    Alcohol/week: 0.0 oz  . Drug use: No    Comment: Hx of polysubstance drug abuse     Allergies   Aspirin and Chlorpromazine hcl   Review of Systems Review of Systems  All other systems reviewed and are negative.    Physical Exam Updated Vital Signs BP 105/74 (BP Location: Left Arm)   Pulse (!) 121   Temp 98.5 F (36.9 C) (Oral)   Resp 16   Ht 5\' 4"  (1.626 m)   Wt 31.8 kg (70 lb)   SpO2 98%   BMI 12.02 kg/m   Physical Exam    Constitutional: She is oriented to person, place, and time. She appears well-developed and well-nourished. No distress.  HENT:  Head: Normocephalic and atraumatic.  Eyes: Pupils are equal, round, and reactive to light. Conjunctivae and EOM are normal.  Neck: Normal range of motion and phonation normal. Neck supple.  Cardiovascular: Normal rate and regular rhythm.  Pulmonary/Chest: Effort normal and breath sounds normal. She exhibits no tenderness.  Abdominal: Soft. She exhibits no distension. There is no tenderness. There is no guarding.  Musculoskeletal: Normal range of motion.  Neurological: She is alert and oriented to person, place, and time. She exhibits normal muscle tone.  Skin: Skin is warm and dry.  Psychiatric: She has a normal mood and affect. Her behavior is normal.  Nursing note and vitals reviewed.    ED Treatments / Results  Labs (all labs ordered are listed, but only abnormal results are  displayed) Labs Reviewed  COMPREHENSIVE METABOLIC PANEL - Abnormal; Notable for the following components:      Result Value   Sodium 133 (*)    CO2 21 (*)    All other components within normal limits  CBC WITH DIFFERENTIAL/PLATELET - Abnormal; Notable for the following components:   WBC 11.4 (*)    RBC 3.05 (*)    Hemoglobin 9.4 (*)    HCT 27.9 (*)    Neutro Abs 9.4 (*)    All other components within normal limits  HEMOGLOBIN AND HEMATOCRIT, BLOOD    EKG None  Radiology Dg Chest 2 View  Result Date: 07/11/2017 CLINICAL DATA:  Cough, lower chest pain. History of substance abuse and COPD. EXAM: CHEST - 2 VIEW COMPARISON:  Chest radiograph June 19, 2017 and CT chest January 15, 2017 FINDINGS: Cardiomediastinal silhouette is normal. Ovoid nodule projecting over LEFT heart, possibly and LEFT lower lobe. No pleural effusions or focal consolidations. Mild chronic interstitial change of the increased lung volumes, flattened hemidiaphragms. Trachea projects midline and there is no  pneumothorax. Soft tissue planes and included osseous structures are non-suspicious. Old LEFT rib fractures. Cachexia. Gas distended bowel in the included abdomen. IMPRESSION: COPD.  No acute cardiopulmonary process. Nodule projecting LEFT lung base without corresponding abnormality on prior CT. Recommend follow up CT chest with contrast on non emergent basis. Aortic Atherosclerosis (ICD10-I70.0).  Emphysema (ICD10-J43.9). Electronically Signed   By: Elon Alas M.D.   On: 07/11/2017 16:57    Procedures Procedures (including critical care time)  Medications Ordered in ED Medications  acetaminophen (TYLENOL) tablet 650 mg (has no administration in time range)  tiotropium (SPIRIVA) inhalation capsule 18 mcg (0 mcg Inhalation Hold 07/11/17 2226)     Initial Impression / Assessment and Plan / ED Course  I have reviewed the triage vital signs and the nursing notes.  Pertinent labs & imaging results that were available during my care of the patient were reviewed by me and considered in my medical decision making (see chart for details).  Clinical Course as of Jul 11 2316  Mon Jul 11, 2017  2126 Normal except sodium low, CO2 low  Comprehensive metabolic panel(!) [EW]  2951 Normal except white count high, hemoglobin low  CBC with Differential(!) [EW]    Clinical Course User Index [EW] Daleen Bo, MD     Patient Vitals for the past 24 hrs:  BP Temp Temp src Pulse Resp SpO2 Height Weight  07/11/17 1901 105/74 98.5 F (36.9 C) Oral (!) 121 16 98 % - -  07/11/17 1843 - - - - - - 5\' 4"  (1.626 m) 31.8 kg (70 lb)    9:46 PM Reevaluation with update and discussion. After initial assessment and treatment, an updated evaluation reveals no change in status, patient updated on plans. Daleen Bo   TTS consult-patient to see psychiatry in the morning  Medical Decision Making: Narcotics addiction, with chronic pain, and recurrent ED visits.  No overt suicidality.  No clear psychiatric  illness.  Patient to be observed overnight by TTS and reevaluated by psychiatry in the morning.  CRITICAL CARE-no Performed by: Daleen Bo   Nursing Notes Reviewed/ Care Coordinated Applicable Imaging Reviewed Interpretation of Laboratory Data incorporated into ED treatment  Plan-as per TTS in conjunction with oncoming provider team   Final Clinical Impressions(s) / ED Diagnoses   Final diagnoses:  Chronic pain syndrome  Narcotic addiction ([place])  Anemia, unspecified type    ED Discharge Orders    None  Daleen Bo, MD 07/11/17 [phone]

## 2017-07-11 NOTE — ED Notes (Signed)
Patient's husband walked into triage and asked the patient to not leave and told the patient that he was going to leave her after he takes the IVC papers out on her.  Patient walked out.

## 2017-07-12 DIAGNOSIS — F19950 Other psychoactive substance use, unspecified with psychoactive substance-induced psychotic disorder with delusions: Secondary | ICD-10-CM | POA: Diagnosis present

## 2017-07-12 IMAGING — CR DG CHEST 2V
2 series · 2 of 2 positions shown · non-contrast
Comparison: 11/16/2014

CLINICAL DATA: Per pt, states she drank Anar[employer] to help her breathe
better-now having abdominal pain. When asked about chest complaints
pt states she hurts all over. Current smoker, COPD.

EXAM:
CHEST - 2 VIEW

[w chest pa]
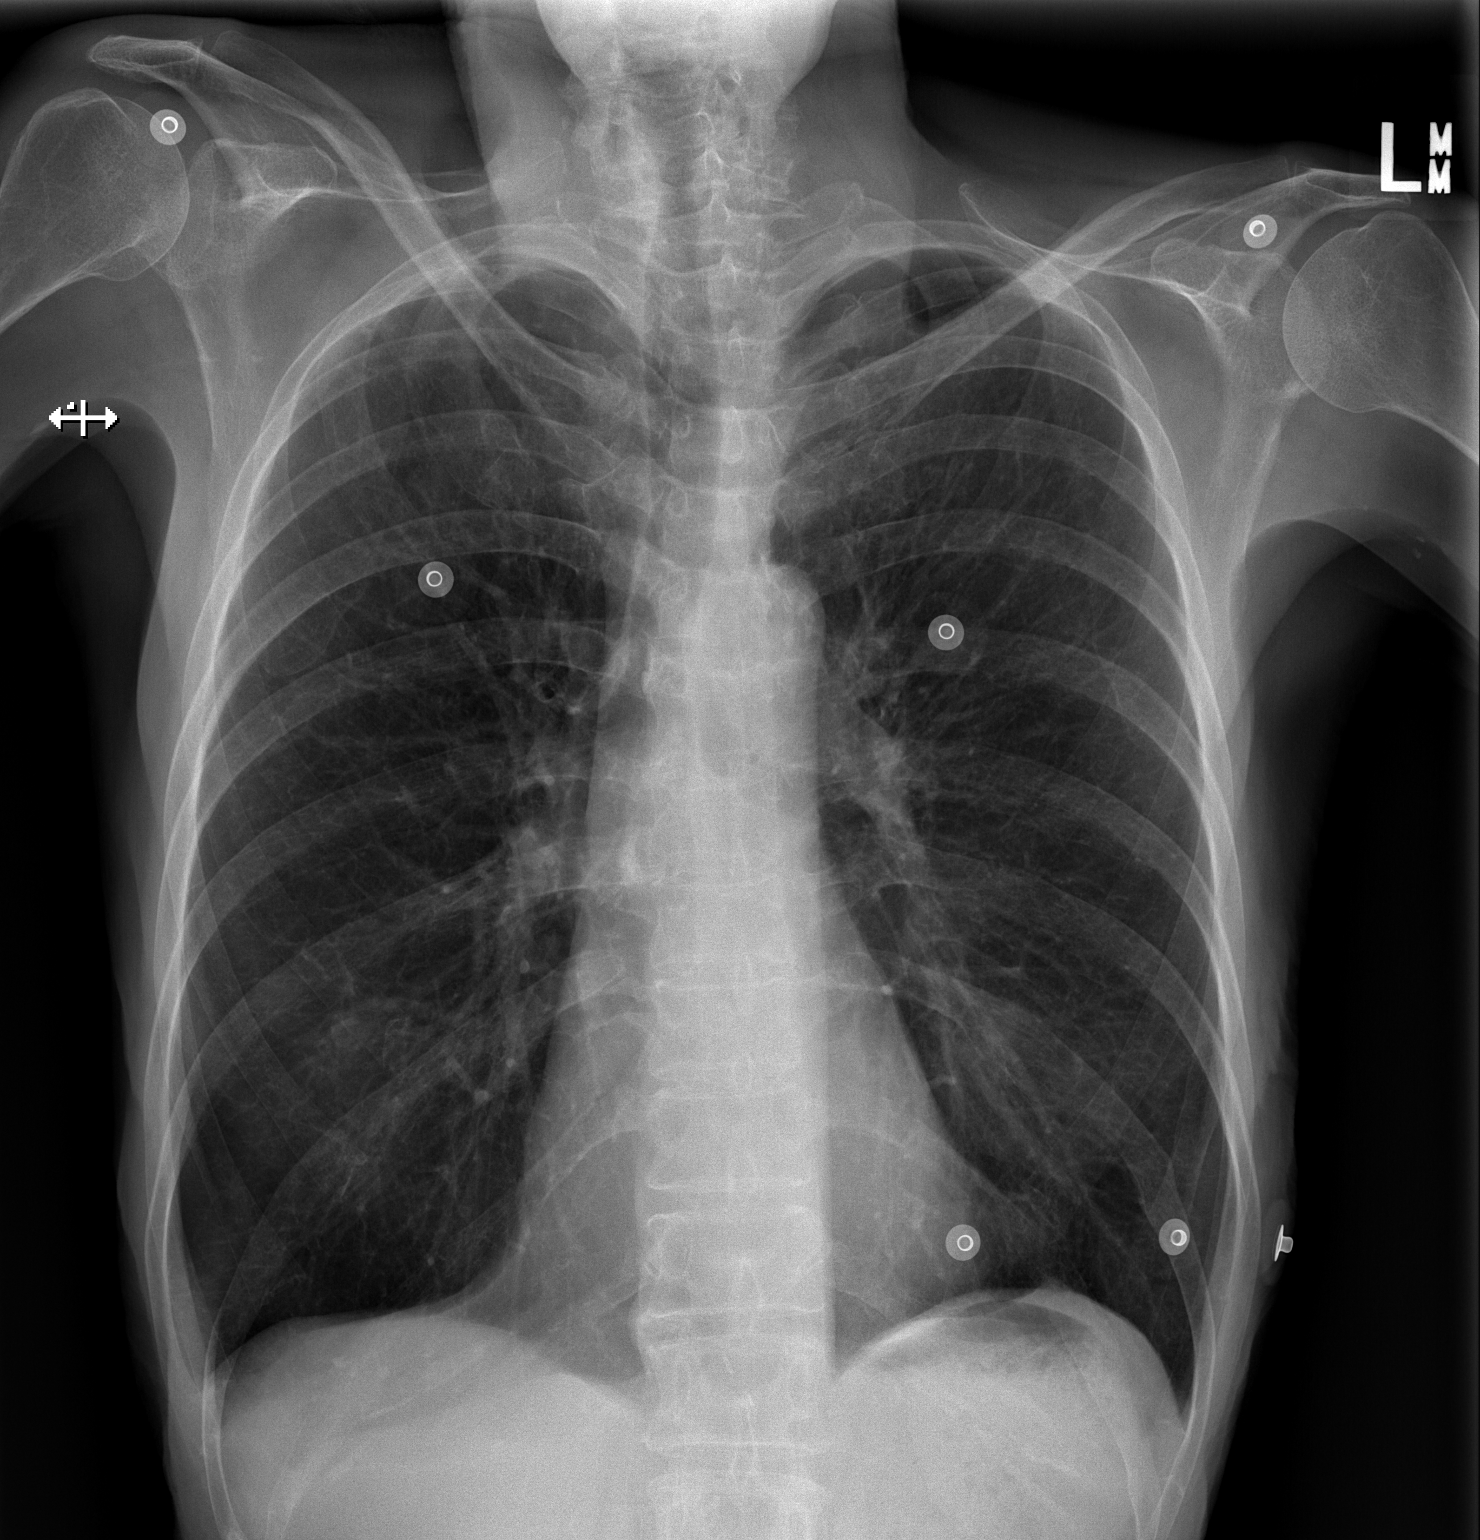

[w chest lat]
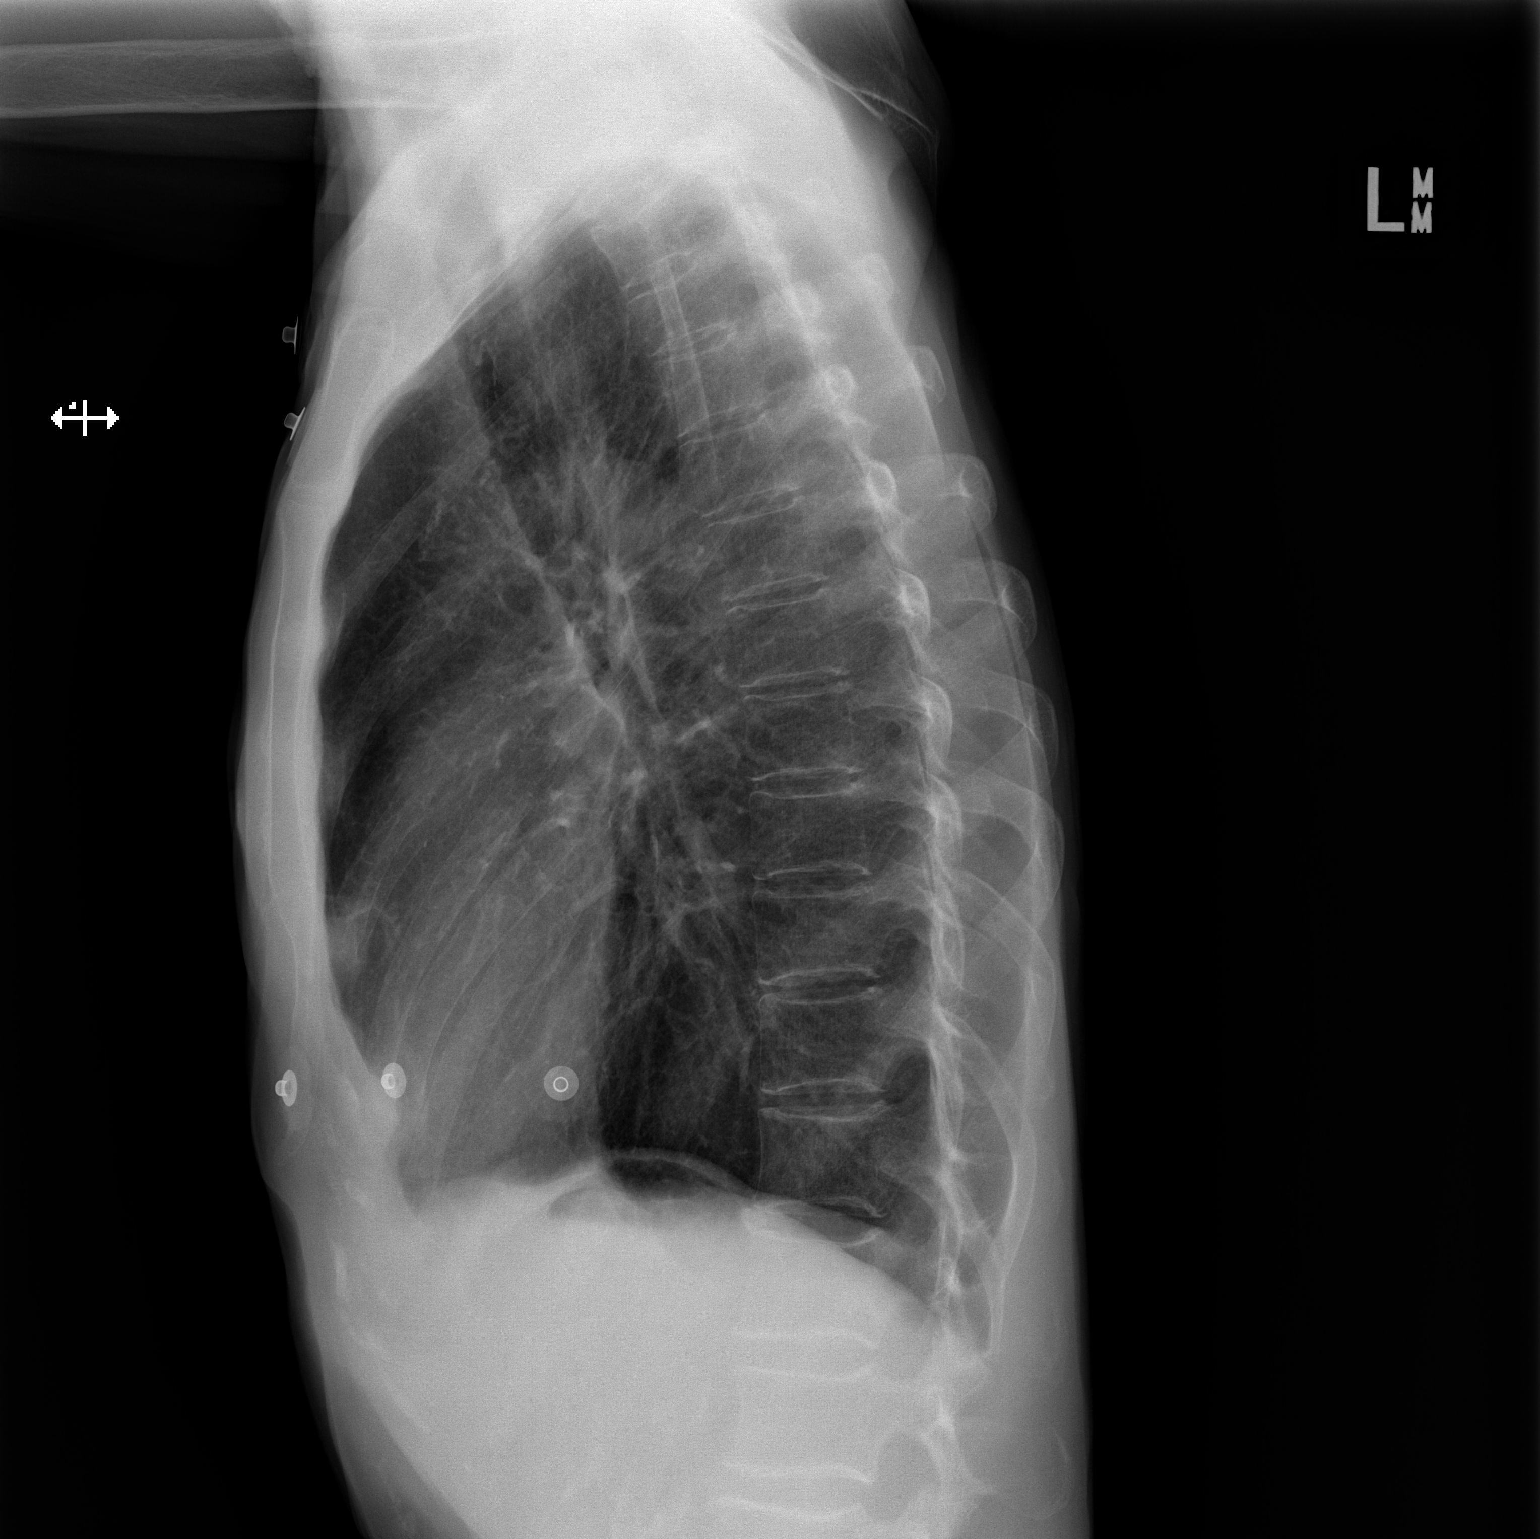

[2 of 2 positions shown; findings below may reference images not displayed]

FINDINGS: Lungs hyperinflated, clear.  Heart size normal.
No effusion.  No pneumothorax.
Visualized skeletal structures are unremarkable.
IMPRESSION: Hyperinflation without acute or superimposed abnormality.

## 2017-07-12 MED ORDER — HYDROXYZINE HCL 25 MG PO TABS: 25.0000 mg | ORAL_TABLET | Freq: Three times a day (TID) | ORAL | 0 refills | Status: DC | PRN

## 2017-07-12 MED ORDER — HYDROXYZINE HCL 25 MG PO TABS
25.0000 mg | ORAL_TABLET | Freq: Three times a day (TID) | ORAL | Status: DC | PRN
  Administered 2017-07-12: 25 mg via ORAL
  Filled 2017-07-12: qty 1

## 2017-07-12 MED ORDER — GABAPENTIN 300 MG PO CAPS
300.0000 mg | ORAL_CAPSULE | Freq: Once | ORAL | Status: AC
  Administered 2017-07-12: 300 mg via ORAL
  Filled 2017-07-12: qty 1

## 2017-07-12 NOTE — ED Notes (Signed)
Pt discharged home. Discharged instructions read to pt who verbalized understanding. All belongings returned to pt who signed for same. Denies SI/HI, is not delusional and not responding to internal stimuli. Escorted pt to the ED exit.    

## 2017-07-12 NOTE — ED Notes (Signed)
Pt is alert & oriented x4; denies SI/HI/AVH; is not delusional. Pt has significant hand tremors reported to Dr. Mariea Clonts. Medications ordered. Pt denies drinking alcohol or using any Ativan in the past month. Pt does not know the reason she has tremors.

## 2017-07-12 NOTE — ED Notes (Signed)
Patient denies pain and is resting comfortably.  

## 2017-07-12 NOTE — BH Assessment (Signed)
[facility] Assessment Progress Note  Per Buford Dresser, DO, this pt does not require psychiatric hospitalization at this time.  Pt presents under IVC initiated by pt's husband, which Dr Mariea Clonts has rescinded.  Pt is to be discharged from [facility] with recommendation to continue treatment with [facility], LCAS, LPC.  This has been included in pt's discharge instructions.  Pt's nurse, Diane, has been notified.  Jalene Mullet, [place] Triage Specialist [phone]

## 2017-07-12 NOTE — Discharge Instructions (Signed)
For your behavioral health needs, you are advised to continue treatment with Sherren Kerns, LCAS, LPC:       [address], [place],  [address]      [phone]

## 2017-07-12 NOTE — [facility]HH Suicide Risk Assessment (Signed)
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 Failure to thrive in adult [R62.7] 06/21/2017  . Pressure injury of skin [L89.90] 04/06/2017  . Pressure injury of skin of right ischial tuberosity region: Stage 2 [L89.319] 04/06/2017  . Acute urinary retention [R33.8] 04/04/2017  . Benzodiazepine dependence ([place]) [F13.20]   . AKI (acute kidney injury) ([place]) [N17.9] 04/03/2017  . Hip fracture, right ([place]) [S72.001A] 06/02/2016  . Narcotic addiction ([place]) [F11.20] 02/29/2016  . Abdominal pain, chronic, epigastric- due to chronic pancreatitis [R10.13, G89.29] 02/29/2016  . COPD (chronic obstructive pulmonary disease) ([place]) [J44.9] 02/28/2016  . Cough [R05] 09/03/2015  . Abdominal pain [R10.9] 01/03/2015  . Hyponatremia [E87.1] 01/03/2015  . Hypocalcemia [E83.51] 01/03/2015  . Underweight [R63.6] 01/03/2015  . COPD GOLD III with reversible component  [J44.9] 07/17/2014  . DTs (delirium tremens) ([place]) [F10.231] 06/02/2013  . Chronic alcoholic pancreatitis ([place]) [K86.0] 06/02/2013  . Lactic acidosis [E87.2] 06/02/2013  . Recurrent acute pancreatitis [K85.90] 03/28/2013  . Alcohol withdrawal ([place]) [F10.239] 03/28/2013  . Pancreatitis [K85.90] 03/09/2013  . Tobacco abuse [Z72.0] 03/09/2013  . Alcohol withdrawal syndrome with perceptual disturbance ([place]) [F10.232] 03/09/2013  . Acute alcoholic pancreatitis [F02.63] 02/09/2013  . Benzodiazepine withdrawal ([place]) [F13.239] 02/09/2013  . Protein-calorie malnutrition, severe ([place]) [E43] 02/09/2013  . Hypokalemia [E87.6] 02/27/2011  . Abdominal pain, acute [R10.9] 02/25/2011  . Nausea and vomiting [R11.2] 02/25/2011  . Thrombocytopenia ([place]) [D69.6]  02/25/2011  . COPD with acute exacerbation ([place]) [J44.1] 02/25/2011  . HTN (hypertension), malignant [I10] 02/25/2011  . KIDNEY CANCER [C64.9] 10/18/2008  . Anxiety state [F41.1] 10/18/2008  . GERD [K21.9] 10/18/2008  . Hepatic cirrhosis ([place]) [K74.60] 10/18/2008  . PANCREATITIS, CHRONIC- atrophic pancreas [K86.1] 10/18/2008  . DYSPNEA [R06.02] 10/18/2008  . GASTRIC ULCER, HX OF [Z87.11] 10/18/2008    Total Time spent with patient: 45 minutes    Musculoskeletal: Strength & Muscle Tone: within normal limits Gait & Station: normal Patient leans: N/A  Psychiatric Specialty Exam: Physical Exam  Nursing note and vitals reviewed. Constitutional: She is oriented to person, place, and time. She appears well-developed and well-nourished.  HENT:  Head: Normocephalic.  Neck: Normal range of motion.  Cardiovascular: Normal rate.  Respiratory: Effort normal.  Musculoskeletal: Normal range of motion.  Neurological: She is alert and oriented to person, place, and time.  Psychiatric: Her speech is normal and behavior is normal. Judgment and thought content normal. Her mood appears anxious. Cognition and memory are normal.    Review of Systems  Constitutional: Positive for malaise/fatigue.  Psychiatric/Behavioral: Positive for substance abuse. The patient is nervous/anxious.   All other systems reviewed and are negative.   Blood pressure (!) 142/87, pulse 88, temperature 98.5 F (36.9 C), temperature source Oral, resp. rate 16, height 5\' 4"  (1.626 m), weight 31.8 kg (70 lb), SpO2 98 %.Body mass index is 12.02 kg/m.  General Appearance: Casual  Eye Contact:  Good  Speech:  Normal Rate  Volume:  Normal  Mood:  Anxious  Affect:  Congruent  Thought Process:  Coherent and Descriptions of Associations: Intact  Orientation:  Full (Time, Place, and Person)  Thought Content:  WDL and Logical  Suicidal Thoughts:  No  Homicidal Thoughts:  No  Memory:  Immediate;   Good Recent;  Good Remote;   Good  Judgement:  Fair  Insight:  Fair  Psychomotor Activity:  Normal  Concentration:  Concentration: Good and Attention Span: Good  Recall:  Good  Fund of Knowledge:  Good  Language:  Good  Akathisia:  No  Handed:  Right  AIMS (if indicated):     Assets:  Leisure Time Physical Health Resilience Social Support  ADL's:  Intact  Cognition:  WNL  Sleep:       Mental Status Per Nursing Assessment::   On Admission:   substance abuse with delusions, not eating   Demographic Factors:  Caucasian  Loss Factors: NA  Historical Factors: NA  Risk Reduction Factors:   Sense of responsibility to family, Living with another person, especially a relative, Positive social support and Positive therapeutic relationship  Continued Clinical Symptoms:  Anxiety, mild  Cognitive Features That Contribute To Risk:  None    Suicide Risk:  Minimal: No identifiable suicidal ideation.  Patients presenting with no risk factors but with morbid ruminations; may be classified as minimal risk based on the severity of the depressive symptoms    Plan Of Care/Follow-up recommendations:  Activity:  as tolerated Diet:  heart healthy diet  LORD, JAMISON, NP 07/12/2017, 10:53 AM

## 2017-07-12 NOTE — Consult Note (Addendum)
[facility] Consult   Reason for Consult:  Delusions  Referring Physician:  EDP Patient Identification: [name] MRN:  [id number] Principal Diagnosis: Substance-induced psychotic disorder with delusions [facility]) Diagnosis:   Patient Active Problem List   Diagnosis Date Noted  . Substance-induced psychotic disorder with delusions [facility]) [F19.950] 07/12/2017    Priority: High  . Failure to thrive in adult [R62.7] 06/21/2017  . Pressure injury of skin [L89.90] 04/06/2017  . Pressure injury of skin of right ischial tuberosity region: Stage 2 [L89.319] 04/06/2017  . Acute urinary retention [R33.8] 04/04/2017  . Benzodiazepine dependence ([place]) [F13.20]   . AKI (acute kidney injury) ([place]) [N17.9] 04/03/2017  . Hip fracture, right ([place]) [S72.001A] 06/02/2016  . Narcotic addiction ([place]) [F11.20] 02/29/2016  . Abdominal pain, chronic, epigastric- due to chronic pancreatitis [R10.13, G89.29] 02/29/2016  . COPD (chronic obstructive pulmonary disease) ([place]) [J44.9] 02/28/2016  . Cough [R05] 09/03/2015  . Abdominal pain [R10.9] 01/03/2015  . Hyponatremia [E87.1] 01/03/2015  . Hypocalcemia [E83.51] 01/03/2015  . Underweight [R63.6] 01/03/2015  . COPD GOLD III with reversible component  [J44.9] 07/17/2014  . DTs (delirium tremens) ([place]) [F10.231] 06/02/2013  . Chronic alcoholic pancreatitis ([place]) [K86.0] 06/02/2013  . Lactic acidosis [E87.2] 06/02/2013  . Recurrent acute pancreatitis [K85.90] 03/28/2013  . Alcohol withdrawal ([place]) [F10.239] 03/28/2013  . Pancreatitis [K85.90] 03/09/2013  . Tobacco abuse [Z72.0] 03/09/2013  . Alcohol withdrawal syndrome with perceptual disturbance ([place]) [F10.232] 03/09/2013  . Acute alcoholic pancreatitis [L38.10] 02/09/2013  . Benzodiazepine withdrawal ([place]) [F13.239] 02/09/2013  . Protein-calorie malnutrition, severe ([place]) [E43] 02/09/2013  . Hypokalemia [E87.6] 02/27/2011  . Abdominal pain, acute [R10.9] 02/25/2011  . Nausea and vomiting  [R11.2] 02/25/2011  . Thrombocytopenia ([place]) [D69.6] 02/25/2011  . COPD with acute exacerbation ([place]) [J44.1] 02/25/2011  . HTN (hypertension), malignant [I10] 02/25/2011  . KIDNEY CANCER [C64.9] 10/18/2008  . Anxiety state [F41.1] 10/18/2008  . GERD [K21.9] 10/18/2008  . Hepatic cirrhosis ([place]) [K74.60] 10/18/2008  . PANCREATITIS, CHRONIC- atrophic pancreas [K86.1] 10/18/2008  . DYSPNEA [R06.02] 10/18/2008  . GASTRIC ULCER, HX OF [Z87.11] 10/18/2008    Total Time spent with patient: 45 minutes  Subjective:   [name] is a 67 y.o. female patient does not warrant admission.  HPI:  67 yo female who presented to the ED via her husband after he reports she was over using her Suboxone and not eating, sleeping, or taking care of her ADLS.  Today on assessment, she denies suicidal/homicidal ideations, hallucinations, and withdrawal symptoms.  Denies over taking her medications.  She ate breakfast and slept last night.  Her husband reported she had delusions yesterday but none today.  Does endorse anxiety and requested Ativan, explained we do not provide this medication but ordered Hydroxyzine.  Stable for discharge.  Past Psychiatric History: substance abuse   Risk to Self: Suicidal Ideation: No Suicidal Intent: No Is patient at risk for suicide?: No Suicidal Plan?: No Access to Means: No What has been your use of drugs/alcohol within the last 12 months?: None How many times?: 0 Other Self Harm Risks: None Triggers for Past Attempts: None known Intentional Self Injurious Behavior: None Risk to Others: Homicidal Ideation: No Thoughts of Harm to Others: Yes-Currently Present Comment - Thoughts of Harm to Others: Wishes she could put inlaws "to death." Current Homicidal Intent: No Current Homicidal Plan: No Access to Homicidal Means: No Identified Victim: In-laws History of harm to others?: No Assessment of Violence: None Noted Violent Behavior Description: None reported Does  patient  have access to weapons?: No Criminal Charges Pending?: No Does patient have a court date: No Prior Inpatient Therapy: Prior Inpatient Therapy: No Prior Outpatient Therapy: Prior Outpatient Therapy: Yes Prior Therapy Dates: current Prior Therapy Facilty/Provider(s): [facility] Reason for Treatment: med management Does patient have an ACCT team?: No Does patient have Intensive In-House Services?  : No Does patient have [facility] services? : Yes Does patient have P4CC services?: No  Past Medical History:  Past Medical History:  Diagnosis Date  . Cancer (HCC)    renal ca  . COPD (chronic obstructive pulmonary disease) (HCC)   . Drug-seeking behavior   . Pancreatitis   . Pancreatitis   . Seizures (HCC)   . Substance abuse (HCC)     Past Surgical History:  Procedure Laterality Date  . IR GENERIC HISTORICAL  05/08/2015   IR RADIOLOGIST EVAL & MGMT 05/08/2015 Glenn Yamagata, MD [facility] INTERV RAD  . IR GENERIC HISTORICAL  01/08/2014   IR RADIOLOGIST EVAL & MGMT 01/08/2014 Glenn Yamagata, MD [facility] INTERV RAD  . KIDNEY SURGERY     removed cancerous lesions  . PARTIAL GASTRECTOMY     Family History:  Family History  Problem Relation Age of Onset  . CAD Mother   . Alcoholism Father   . COPD Father    Family Psychiatric  History: father with alcohol dependence Social History:  Social History   Substance and Sexual Activity  Alcohol Use Not Currently  . Alcohol/week: 0.0 oz     Social History   Substance and Sexual Activity  Drug Use No   Comment: Hx of polysubstance drug abuse    Social History   Socioeconomic History  . Marital status: Married    Spouse name: Not on file  . Number of children: Not on file  . Years of education: Not on file  . Highest education level: Not on file  Occupational History  . Not on file  Social Needs  . Financial resource strain: Not on file  . Food insecurity:    Worry: Not on file    Inability: Not on file  . Transportation needs:     Medical: Not on file    Non-medical: Not on file  Tobacco Use  . Smoking status: Current Every Day Smoker    Packs/day: 1.00    Years: 30.00    Pack years: 30.00    Types: Cigarettes  . Smokeless tobacco: Never Used  . Tobacco comment: smoking up to 1.5 ppd per husband.   Substance and Sexual Activity  . Alcohol use: Not Currently    Alcohol/week: 0.0 oz  . Drug use: No    Comment: Hx of polysubstance drug abuse  . Sexual activity: Never  Lifestyle  . Physical activity:    Days per week: Not on file    Minutes per session: Not on file  . Stress: Not on file  Relationships  . Social connections:    Talks on phone: Not on file    Gets together: Not on file    Attends religious service: Not on file    Active member of club or organization: Not on file    Attends meetings of clubs or organizations: Not on file    Relationship status: Not on file  Other Topics Concern  . Not on file  Social History Narrative  . Not on file   Additional Social History: N/A    Allergies:   Allergies  Allergen Reactions  . Aspirin Other (See Comments)      Break out in welts  . Chlorpromazine Hcl Other (See Comments)    Muscle spasms    Labs:  Results for orders placed or performed during the hospital encounter of 07/11/17 (from the past 48 hour(s))  Comprehensive metabolic panel     Status: Abnormal   Collection Time: 07/11/17  7:21 PM  Result Value Ref Range   Sodium 133 (L) 135 - 145 mmol/L   Potassium 3.8 3.5 - 5.1 mmol/L   Chloride 101 98 - 111 mmol/L    Comment: Please note change in reference range.   CO2 21 (L) 22 - 32 mmol/L   Glucose, Bld 89 70 - 99 mg/dL    Comment: Please note change in reference range.   BUN 13 8 - 23 mg/dL    Comment: Please note change in reference range.   Creatinine, Ser 0.88 0.44 - 1.00 mg/dL   Calcium 9.2 8.9 - 10.3 mg/dL   Total Protein 6.7 6.5 - 8.1 g/dL   Albumin 3.6 3.5 - 5.0 g/dL   AST 22 15 - 41 U/L   ALT 14 0 - 44 U/L    Comment:  Please note change in reference range.   Alkaline Phosphatase 68 38 - 126 U/L   Total Bilirubin 0.3 0.3 - 1.2 mg/dL   GFR calc non Af Amer >60 >60 mL/min   GFR calc Af Amer >60 >60 mL/min    Comment: (NOTE) The eGFR has been calculated using the CKD EPI equation. This calculation has not been validated in all clinical situations. eGFR's persistently <60 mL/min signify possible Chronic Kidney Disease.    Anion gap 11 5 - 15    Comment: Performed at [facility], [place] [address], [place], [place] [address]  CBC with Differential     Status: Abnormal   Collection Time: 07/11/17  7:21 PM  Result Value Ref Range   WBC 11.4 (H) 4.0 - 10.5 K/uL   RBC 3.05 (L) 3.87 - 5.11 MIL/uL   Hemoglobin 9.4 (L) 12.0 - 15.0 g/dL   HCT 27.9 (L) 36.0 - 46.0 %   MCV 91.5 78.0 - 100.0 fL   MCH 30.8 26.0 - 34.0 pg   MCHC 33.7 30.0 - 36.0 g/dL   RDW 14.3 11.5 - 15.5 %   Platelets 317 150 - 400 K/uL   Neutrophils Relative % 82 %   Neutro Abs 9.4 (H) 1.7 - 7.7 K/uL   Lymphocytes Relative 9 %   Lymphs Abs 1.1 0.7 - 4.0 K/uL   Monocytes Relative 9 %   Monocytes Absolute 1.0 0.1 - 1.0 K/uL   Eosinophils Relative 0 %   Eosinophils Absolute 0.0 0.0 - 0.7 K/uL   Basophils Relative 0 %   Basophils Absolute 0.0 0.0 - 0.1 K/uL    Comment: Performed at [facility], [place] [address], [place], [place] [address]    Current Facility-Administered Medications  Medication Dose Route Frequency Provider Last Rate Last Dose  . acetaminophen (TYLENOL) tablet 650 mg  650 mg Oral Q6H PRN Daleen Bo, MD      . hydrOXYzine (ATARAX/VISTARIL) tablet 25 mg  25 mg Oral TID PRN Faythe Dingwall, DO   25 mg at 07/12/17 1034  . tiotropium (SPIRIVA) inhalation capsule 18 mcg  18 mcg Inhalation Daily Daleen Bo, MD   18 mcg at 07/12/17 1014   Current Outpatient Medications  Medication Sig Dispense Refill  . acetaminophen (TYLENOL) 325 MG tablet Take 2 tablets (650 mg total) by  mouth  every 6 (six) hours as needed for moderate pain (use first). (Patient not taking: Reported on 07/11/2017) 20 tablet 0  . ibuprofen (ADVIL,MOTRIN) 400 MG tablet Take 1 tablet (400 mg total) by mouth every 6 (six) hours as needed for moderate pain (back pain. if tylenol ineffective). (Patient not taking: Reported on 07/11/2017) 30 tablet 0  . mometasone-formoterol (DULERA) 200-5 MCG/ACT AERO Inhale 2 puffs into the lungs 2 (two) times daily. 0.1 g 0  . Multiple Vitamin (MULTIVITAMIN WITH MINERALS) TABS tablet Take 1 tablet by mouth daily. (Patient not taking: Reported on 07/11/2017) 30 tablet 0  . nutrition supplement, JUVEN, (JUVEN) PACK Take 1 packet by mouth 2 (two) times daily between meals. (Patient not taking: Reported on 07/08/2017) 60 packet 0  . ondansetron (ZOFRAN) 4 MG tablet Take 1 tablet (4 mg total) by mouth every 6 (six) hours as needed for nausea. (Patient not taking: Reported on 07/11/2017) 20 tablet 0  . pantoprazole (PROTONIX) 40 MG tablet Take 1 tablet (40 mg total) by mouth daily. (Patient not taking: Reported on 07/08/2017) 30 tablet 0  . tiotropium (SPIRIVA HANDIHALER) 18 MCG inhalation capsule Place 1 capsule (18 mcg total) into inhaler and inhale daily. (Patient not taking: Reported on 07/11/2017) 30 capsule 0    Musculoskeletal: Strength & Muscle Tone: within normal limits Gait & Station: normal Patient leans: N/A  Psychiatric Specialty Exam: Physical Exam  Nursing note and vitals reviewed. Constitutional: She is oriented to person, place, and time. She appears well-developed.  thin  HENT:  Head: Normocephalic and atraumatic.  Neck: Normal range of motion.  Cardiovascular: Normal rate.  Respiratory: Effort normal.  Musculoskeletal: Normal range of motion.  Neurological: She is alert and oriented to person, place, and time.  Psychiatric: Her speech is normal and behavior is normal. Judgment and thought content normal. Her mood appears anxious. Cognition and memory are normal.     Review of Systems  Constitutional: Positive for malaise/fatigue.  Psychiatric/Behavioral: Positive for substance abuse. The patient is nervous/anxious.   All other systems reviewed and are negative.   Blood pressure (!) 142/87, pulse 88, temperature 98.5 F (36.9 C), temperature source Oral, resp. rate 16, height 5' 4" (1.626 m), weight 31.8 kg (70 lb), SpO2 98 %.Body mass index is 12.02 kg/m.  General Appearance: Casual  Eye Contact:  Good  Speech:  Normal Rate  Volume:  Normal  Mood:  Anxious  Affect:  Congruent  Thought Process:  Coherent and Descriptions of Associations: Intact  Orientation:  Full (Time, Place, and Person)  Thought Content:  WDL and Logical  Suicidal Thoughts:  No  Homicidal Thoughts:  No  Memory:  Immediate;   Good Recent;   Good Remote;   Good  Judgement:  Fair  Insight:  Fair  Psychomotor Activity:  Normal  Concentration:  Concentration: Good and Attention Span: Good  Recall:  Good  Fund of Knowledge:  Good  Language:  Good  Akathisia:  No  Handed:  Right  AIMS (if indicated):   N/A  Assets:  Leisure Time Physical Health Resilience Social Support  ADL's:  Intact  Cognition:  WNL  Sleep:   N/A     Treatment Plan Summary: Substance induced mood disorder with psychosis with delusions: -Hydroxyzine 25 mg TID PRN anxiety  Disposition: No evidence of imminent risk to self or others at present.    LORD, JAMISON, NP 07/12/2017 10:45 AM   Patient seen face-to-face for psychiatric evaluation, chart reviewed and case discussed with the   physician extender and developed treatment plan. Reviewed the information documented and agree with the treatment plan.  Buford Dresser, DO 07/13/17 1:54 AM

## 2017-07-12 NOTE — ED Notes (Signed)
Pt A/O. No noted distress. Denies AVH/SI/HI. She notes she experience chronic pain/nausea. Pt is resting in the bed. Pt is hoping to be discharge in the morning. She was educated, "the doctors will make rounds between 9 and 10 am, then there will be a decision made on rather you will stay or go to an inpatient facility." Staff will continue to monitor, maintain safety, and meet needs.

## 2017-07-19 IMAGING — DX DG CHEST 1V PORT
1 series · 1 of 1 positions shown · non-contrast
Comparison: Chest radiograph performed 12/28/2014

CLINICAL DATA: Central line placement.  Initial encounter.

EXAM:
PORTABLE CHEST 1 VIEW

[chest ap]
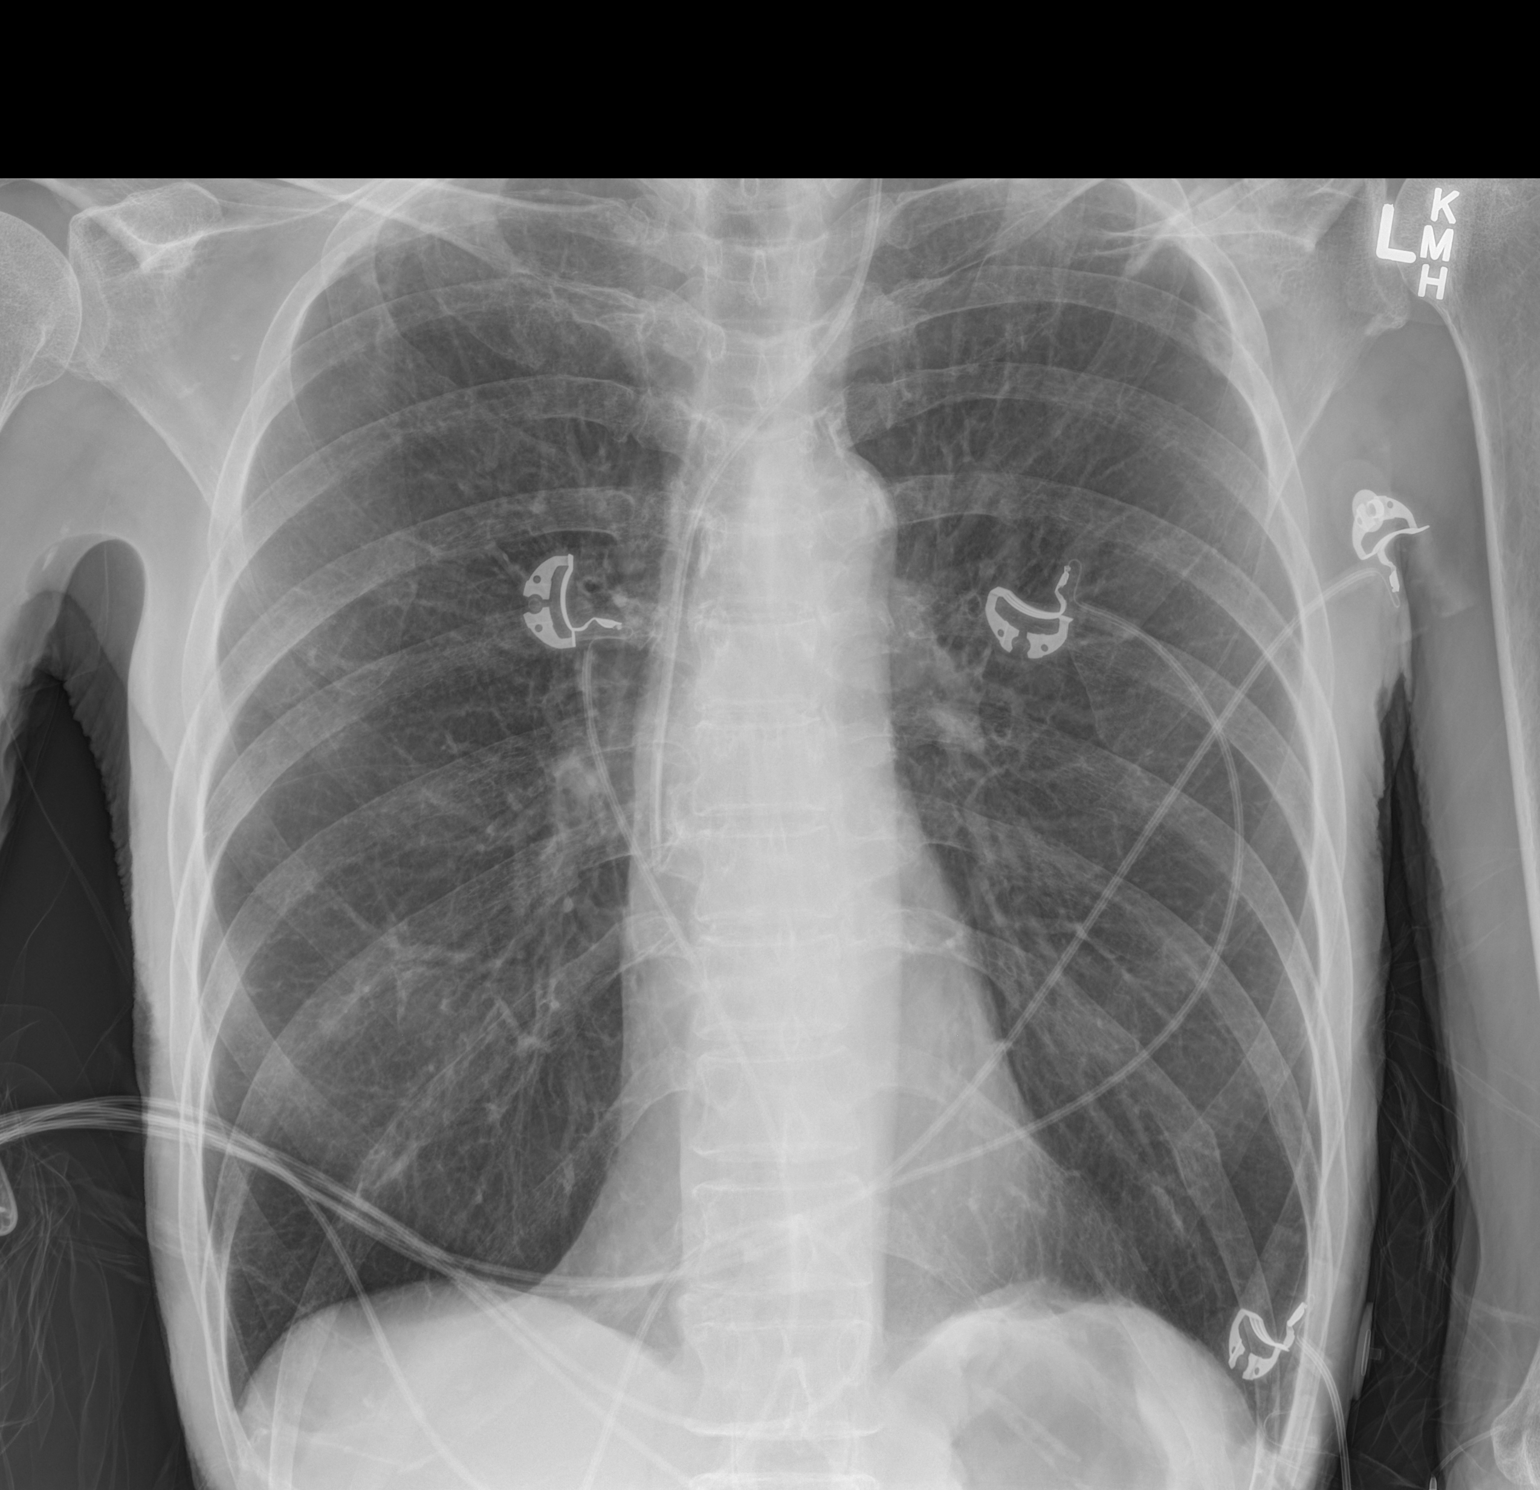

[1 of 1 positions shown; findings below may reference images not displayed]

FINDINGS: The left IJ line is noted ending about the distal SVC.

The lungs are hyperexpanded, with flattening of the hemidiaphragms,
compatible with COPD. Mild chronic peribronchial thickening is
noted. There is no evidence of focal opacification, pleural effusion
or pneumothorax. Bilateral nipple shadows are seen.

The cardiomediastinal silhouette is within normal limits. No acute
osseous abnormalities are seen.
IMPRESSION: 1. Left IJ line noted ending about the distal SVC.
2. Findings of COPD; lungs otherwise grossly clear.

## 2017-07-22 ENCOUNTER — Ambulatory Visit
Admission: RE | Admit: 2017-07-22 | Discharge: 2017-07-22 | Disposition: A | Discharge status: Home / Self Care | Payer: Medicaid Other | Source: Ambulatory Visit | Attending: Orthopaedic Surgery | Admitting: Orthopaedic Surgery

## 2017-07-22 DIAGNOSIS — G8929 Other chronic pain: Secondary | ICD-10-CM

## 2017-07-22 DIAGNOSIS — M5442 Lumbago with sciatica, left side: Principal | ICD-10-CM

## 2017-07-22 DIAGNOSIS — M5441 Lumbago with sciatica, right side: Principal | ICD-10-CM

## 2017-07-26 ENCOUNTER — Ambulatory Visit (INDEPENDENT_AMBULATORY_CARE_PROVIDER_SITE_OTHER): Payer: Medicaid Other | Admitting: Orthopaedic Surgery

## 2017-07-26 ENCOUNTER — Encounter (INDEPENDENT_AMBULATORY_CARE_PROVIDER_SITE_OTHER): Payer: Self-pay | Admitting: Orthopaedic Surgery

## 2017-07-26 DIAGNOSIS — M545 Low back pain, unspecified: Secondary | ICD-10-CM

## 2017-07-26 DIAGNOSIS — M47816 Spondylosis without myelopathy or radiculopathy, lumbar region: Secondary | ICD-10-CM

## 2017-07-26 DIAGNOSIS — M79604 Pain in right leg: Secondary | ICD-10-CM | POA: Insufficient documentation

## 2017-07-26 NOTE — Progress Notes (Signed)
Office Visit Note   Patient: [name]           Date of Birth: September 23, 1950           MRN: [id number] Visit Date: 07/26/2017              Requested by: Azzie Glatter, [place], [place] [address] PCP: Azzie Glatter, FNP   Assessment & Plan: Visit Diagnoses:  1. Osteoarthritis of facet joint of lumbar spine   2. Low back pain radiating to both legs     Plan:  #1: Evaluation by Dr. Ernestina Patches for questionable facet injections versus epidural injection #2: Follow back up as needed #3: Because her pancreatitis I told him to refrain from nonsteroidal anti-inflammatories.  Follow-Up Instructions: Return if symptoms worsen or fail to improve.    Face-to-face time spent with patient was greater than 30 minutes.  Greater than 50% of the time was spent in counseling and coordination of care.   Orders:  Orders Placed This Encounter  Procedures  . Ambulatory referral to Physical Medicine Rehab   No orders of the defined types were placed in this encounter.     Procedures: No procedures performed   Clinical Data: No additional findings.   Subjective: Chief Complaint  Patient presents with  . Follow-up    MRI L SPINE FOLLOW UP    HPI  [name] is a 67 year old white female presents for follow-up of an MRI scan of her lumbar spine.  She has been treated by a pain clinic and is presently on Suboxone.  He has been admitted to the hospital on one occasion for her back pain.  She has had an emergency room visit on July 11, 2017 for other reasons.  She continues to have low back pain referred to both anterior thighs.  She did have an MRI scan and she returns today for review of her MRI scan.  Review of Systems  Constitutional: Positive for fatigue. Negative for fever.  HENT: Negative for ear pain.   Eyes: Positive for pain.  Respiratory: Positive for cough and shortness of breath.   Cardiovascular: Negative for leg swelling.  Gastrointestinal: Negative  for constipation and diarrhea.  Genitourinary: Positive for difficulty urinating.  Musculoskeletal: Positive for back pain. Negative for neck pain.  Skin: Negative for rash.  Allergic/Immunologic: Negative for food allergies.  Neurological: Positive for weakness and numbness.  Hematological: Bruises/bleeds easily.  Psychiatric/Behavioral: Positive for sleep disturbance.     Objective: Vital Signs: Ht 5\' 4"  (1.626 m)   Wt 70 lb (31.8 kg)   BMI 12.02 kg/m   Physical Exam  Constitutional: She is oriented to person, place, and time. She appears distressed.  HENT:  Mouth/Throat: Oropharynx is clear and moist.  Eyes: Pupils are equal, round, and reactive to light. EOM are normal.  Pulmonary/Chest: Effort normal.  Neurological: She is alert and oriented to person, place, and time.  Skin: Skin is warm and dry.  Psychiatric: Her behavior is normal.  Very distressed at this time.    Ortho Exam  Exam today reveals a very thin cachectic white female accompanied by her husband.  She appears to be extremely emaciated and very thin.  She does have some tenderness along the lumbar spine to palpation.  She is got good motion of both her hips without pain or discomfort.  Her exam in the motor appears to be intact.  Occult and determining her strength at this time.  Have breakaway weakness.  Specialty Comments:  No specialty comments available.  Imaging: Mr Lumbar Spine W/o Contrast  Result Date: 07/22/2017 CLINICAL DATA:  Low back pain radiating down both legs with bilateral leg numbness in weakness for the last 3 years. EXAM: MRI LUMBAR SPINE WITHOUT CONTRAST TECHNIQUE: Multiplanar, multisequence MR imaging of the lumbar spine was performed. No intravenous contrast was administered. COMPARISON:  CT abdomen 01/15/2017.  MRI 03/17/2007 FINDINGS: Segmentation:  5 lumbar type vertebral bodies. Alignment:  1 or 2 mm of anterolisthesis at L5-S1. Vertebrae:  No fracture or primary bone lesion. Conus  medullaris and cauda equina: Conus extends to the L2 level. Conus and cauda equina appear normal. Paraspinal and other soft tissues: Negative Disc levels: No abnormality at T11-12, T12-L1 or L1-2. L2-3: Disc degeneration with bulging of the disc, more prominent in the right foraminal to extraforaminal region. No canal stenosis. No likely compression of the exiting L2 nerve. L3-4: Mild bulging of the disc.  No compressive stenosis. L4-5: Mild bulging of the disc.  No compressive stenosis. L5-S1: Facet osteoarthritis with 1 or 2 mm of anterolisthesis. Mild bulging of the disc. No compressive canal stenosis. Mild foraminal narrowing on the left. Since the previous study, the right-sided disc bulge at L2-3 has worsened slightly. IMPRESSION: L2-3: Disc degeneration with bulging of the disc more towards the right with mild foraminal narrowing but no distinct neural compression. Slight worsening of this pathology since the comparison study of 2009. L5-S1 facet degeneration with 1 or 2 mm of anterolisthesis. Mild bulging of the disc. Mild left foraminal narrowing but without definite compression of the exiting L5 nerve. Similar appearance to the previous study. Electronically Signed   By: Nelson Chimes M.D.   On: 07/22/2017 16:36     PMFS History: Current Outpatient Medications  Medication Sig Dispense Refill  . hydrOXYzine (ATARAX/VISTARIL) 25 MG tablet Take 1 tablet (25 mg total) by mouth 3 (three) times daily as needed (anxiety). 30 tablet 0  . mometasone-formoterol (DULERA) 200-5 MCG/ACT AERO Inhale 2 puffs into the lungs 2 (two) times daily. 0.1 g 0  . nutrition supplement, JUVEN, (JUVEN) PACK Take 1 packet by mouth 2 (two) times daily between meals. (Patient not taking: Reported on 07/08/2017) 60 packet 0  . tiotropium (SPIRIVA HANDIHALER) 18 MCG inhalation capsule Place 1 capsule (18 mcg total) into inhaler and inhale daily. (Patient not taking: Reported on 07/11/2017) 30 capsule 0   No current  facility-administered medications for this visit.      Patient Active Problem List   Diagnosis Date Noted  . Osteoarthritis of facet joint of lumbar spine 07/26/2017  . Low back pain radiating to both legs 07/26/2017  . Substance-induced psychotic disorder with delusions ([place]) 07/12/2017  . Failure to thrive in adult 06/21/2017  . Pressure injury of skin 04/06/2017  . Pressure injury of skin of right ischial tuberosity region: Stage 2 04/06/2017  . Acute urinary retention 04/04/2017  . Benzodiazepine dependence ([place])   . AKI (acute kidney injury) ([place]) 04/03/2017  . Hip fracture, right ([place]) 06/02/2016  . Narcotic addiction ([place]) 02/29/2016  . Abdominal pain, chronic, epigastric- due to chronic pancreatitis 02/29/2016  . COPD (chronic obstructive pulmonary disease) ([place]) 02/28/2016  . Cough 09/03/2015  . Abdominal pain 01/03/2015  . Hyponatremia 01/03/2015  . Hypocalcemia 01/03/2015  . Underweight 01/03/2015  . COPD GOLD III with reversible component  07/17/2014  . DTs (delirium tremens) ([place]) 06/02/2013  . Chronic alcoholic pancreatitis ([place]) 06/02/2013  . Lactic acidosis 06/02/2013  . Recurrent acute pancreatitis  03/28/2013  . Alcohol withdrawal ([place]) 03/28/2013  . Pancreatitis 03/09/2013  . Tobacco abuse 03/09/2013  . Alcohol withdrawal syndrome with perceptual disturbance ([place]) 03/09/2013  . Acute alcoholic pancreatitis 40/08/6759  . Benzodiazepine withdrawal ([place]) 02/09/2013  . Protein-calorie malnutrition, severe ([place]) 02/09/2013  . Hypokalemia 02/27/2011  . Abdominal pain, acute 02/25/2011  . Nausea and vomiting 02/25/2011  . Thrombocytopenia ([place]) 02/25/2011  . COPD with acute exacerbation ([place]) 02/25/2011  . HTN (hypertension), malignant 02/25/2011  . KIDNEY CANCER 10/18/2008  . Anxiety state 10/18/2008  . GERD 10/18/2008  . Hepatic cirrhosis ([place]) 10/18/2008  . PANCREATITIS, CHRONIC- atrophic pancreas 10/18/2008  . DYSPNEA 10/18/2008  . GASTRIC ULCER, HX OF  10/18/2008   Past Medical History:  Diagnosis Date  . Cancer ([place])    renal ca  . COPD (chronic obstructive pulmonary disease) ([place])   . Drug-seeking behavior   . Pancreatitis   . Pancreatitis   . Seizures ([place])   . Substance abuse ([place])     Family History  Problem Relation Age of Onset  . CAD Mother   . Alcoholism Father   . COPD Father     Past Surgical History:  Procedure Laterality Date  . IR GENERIC HISTORICAL  05/08/2015   IR RADIOLOGIST EVAL & MGMT 05/08/2015 Aletta Edouard, MD [facility] INTERV RAD  . IR GENERIC HISTORICAL  01/08/2014   IR RADIOLOGIST EVAL & MGMT 01/08/2014 Aletta Edouard, MD [facility] INTERV RAD  . KIDNEY SURGERY     removed cancerous lesions  . PARTIAL GASTRECTOMY     Social History   Occupational History  . Not on file  Tobacco Use  . Smoking status: Current Every Day Smoker    Packs/day: 1.00    Years: 30.00    Pack years: 30.00    Types: Cigarettes  . Smokeless tobacco: Never Used  . Tobacco comment: smoking up to 1.5 ppd per husband.   Substance and Sexual Activity  . Alcohol use: Not Currently    Alcohol/week: 0.0 oz  . Drug use: No    Comment: Hx of polysubstance drug abuse  . Sexual activity: Never

## 2017-08-03 ENCOUNTER — Ambulatory Visit: Payer: Self-pay | Admitting: Family Medicine

## 2017-08-08 ENCOUNTER — Ambulatory Visit (INDEPENDENT_AMBULATORY_CARE_PROVIDER_SITE_OTHER): Payer: Medicaid Other | Admitting: Family Medicine

## 2017-08-08 ENCOUNTER — Encounter: Payer: Self-pay | Admitting: Family Medicine

## 2017-08-08 VITALS — BP 140/82 | HR 74 | Temp 98.2°F | Ht 64.0 in | Wt <= 1120 oz

## 2017-08-08 DIAGNOSIS — J449 Chronic obstructive pulmonary disease, unspecified: Secondary | ICD-10-CM

## 2017-08-08 DIAGNOSIS — F411 Generalized anxiety disorder: Secondary | ICD-10-CM | POA: Diagnosis not present

## 2017-08-08 DIAGNOSIS — E43 Unspecified severe protein-calorie malnutrition: Secondary | ICD-10-CM

## 2017-08-08 DIAGNOSIS — Z09 Encounter for follow-up examination after completed treatment for conditions other than malignant neoplasm: Secondary | ICD-10-CM

## 2017-08-08 DIAGNOSIS — G894 Chronic pain syndrome: Secondary | ICD-10-CM

## 2017-08-08 DIAGNOSIS — I10 Essential (primary) hypertension: Secondary | ICD-10-CM

## 2017-08-08 MED ORDER — HYDROXYZINE HCL 25 MG PO TABS: 25.0000 mg | ORAL_TABLET | Freq: Three times a day (TID) | ORAL | 2 refills | Status: DC | PRN

## 2017-08-08 MED ORDER — BUSPIRONE HCL 5 MG PO TABS: 5.0000 mg | ORAL_TABLET | Freq: Three times a day (TID) | ORAL | 1 refills | Status: DC

## 2017-08-08 MED ORDER — MOMETASONE FURO-FORMOTEROL FUM 200-5 MCG/ACT IN AERO: 2.0000 | INHALATION_SPRAY | Freq: Two times a day (BID) | RESPIRATORY_TRACT | 0 refills | Status: DC

## 2017-08-08 MED ORDER — TIOTROPIUM BROMIDE MONOHYDRATE 18 MCG IN CAPS
18.0000 ug | ORAL_CAPSULE | Freq: Every day | RESPIRATORY_TRACT | 0 refills | Status: DC
Start: 2017-08-08 — End: 2017-09-08

## 2017-08-08 NOTE — Progress Notes (Signed)
Follow Up   Subjective:    Patient ID: [name], female    DOB: 08/22/1950, 67 y.o.   MRN: [id number]  Chief Complaint  Patient presents with  . Follow-up    59month chronic pain, HTN  . Shortness of Breath  . Shaking  . Urinary Retention    HPI  [name] has a past medical history of Substance Abuse, Seizures, Pancreatitis, and COPD. She is here for follow up.   Current Status: Since her last office visit, she has been hospitalized. Her husband states that she is currently not eating well. Patient states that she does not have an appetite.   She denies fevers, chills, fatigue, recent infections, weight loss, and night sweats. She has not had any headaches, visual changes, dizziness, and falls. No chest pain, heart palpitations, cough and shortness of breath reported. No reports of GI problems such as nausea, vomiting, diarrhea, and constipation. She has no reports of blood in stools, dysuria and hematuria.   She she seems very anxious/nervous today. Denies suicidal ideations, homicidal ideations, or auditory hallucinations. She denies pain today.   Past Medical History:  Diagnosis Date  . Cancer ([place])    renal ca  . COPD (chronic obstructive pulmonary disease) ([place])   . Drug-seeking behavior   . Pancreatitis   . Pancreatitis   . Seizures ([place])   . Substance abuse ([place])     Family History  Problem Relation Age of Onset  . CAD Mother   . Alcoholism Father   . COPD Father     Social History   Socioeconomic History  . Marital status: Married    Spouse name: Not on file  . Number of children: Not on file  . Years of education: Not on file  . Highest education level: Not on file  Occupational History  . Not on file  Social Needs  . Financial resource strain: Not on file  . Food insecurity:    Worry: Not on file    Inability: Not on file  . Transportation needs:    Medical: Not on file    Non-medical: Not on file  Tobacco Use  . Smoking status: Current  Every Day Smoker    Packs/day: 1.00    Years: 30.00    Pack years: 30.00    Types: Cigarettes  . Smokeless tobacco: Never Used  . Tobacco comment: smoking up to 1.5 ppd per husband.   Substance and Sexual Activity  . Alcohol use: Not Currently    Alcohol/week: 0.0 oz  . Drug use: No    Comment: Hx of polysubstance drug abuse  . Sexual activity: Never  Lifestyle  . Physical activity:    Days per week: Not on file    Minutes per session: Not on file  . Stress: Not on file  Relationships  . Social connections:    Talks on phone: Not on file    Gets together: Not on file    Attends religious service: Not on file    Active member of club or organization: Not on file    Attends meetings of clubs or organizations: Not on file    Relationship status: Not on file  . Intimate partner violence:    Fear of current or ex partner: Not on file    Emotionally abused: Not on file    Physically abused: Not on file    Forced sexual activity: Not on file  Other Topics Concern  . Not on file  Social History Narrative  . Not on file    Past Surgical History:  Procedure Laterality Date  . IR GENERIC HISTORICAL  05/08/2015   IR RADIOLOGIST EVAL & MGMT 05/08/2015 Aletta Edouard, MD [facility] INTERV RAD  . IR GENERIC HISTORICAL  01/08/2014   IR RADIOLOGIST EVAL & MGMT 01/08/2014 Aletta Edouard, MD [facility] INTERV RAD  . KIDNEY SURGERY     removed cancerous lesions  . PARTIAL GASTRECTOMY      Immunization History  Administered Date(s) Administered  . Influenza Split 10/22/2013, 10/27/2014, 08/05/2015  . Pneumococcal Conjugate-13 09/02/2014  . Pneumococcal Polysaccharide-23 02/11/2013   BP 140/82 (BP Location: Left Arm, Patient Position: Sitting, Cuff Size: Small)   Pulse 74   Temp 98.2 F (36.8 C) (Oral)   Ht 5\' 4"  (1.626 m)   Wt 69 lb 3.2 oz (31.4 kg)   SpO2 100%   BMI 11.88 kg/m   Review of Systems  Constitutional: Negative.   HENT: Negative.   Eyes: Negative.   Respiratory: Negative.    Cardiovascular: Negative.   Gastrointestinal: Negative.        Decreased appetite.   Endocrine: Negative.   Genitourinary: Negative.   Musculoskeletal: Negative.   Skin: Negative.   Allergic/Immunologic: Negative.   Neurological: Negative.   Hematological: Negative.   Psychiatric/Behavioral: Positive for agitation and sleep disturbance. The patient is nervous/anxious.    Objective:   Physical Exam  Constitutional: She is oriented to person, place, and time. She appears well-developed.  HENT:  Head: Normocephalic and atraumatic.  Mouth/Throat: Oropharynx is clear and moist.  Eyes: Pupils are equal, round, and reactive to light. EOM are normal.  Neck: Normal range of motion. Neck supple.  Cardiovascular: Normal rate, regular rhythm, normal heart sounds and intact distal pulses.  Pulses:      Carotid pulses are on the right side with bruit, and on the left side with bruit. Pulmonary/Chest: Effort normal. She has decreased breath sounds in the right upper field and the left upper field.  Musculoskeletal: Normal range of motion.       Right lower leg: Normal.       Left lower leg: Normal.  Neurological: She is alert and oriented to person, place, and time.  Skin: Skin is warm and dry. Capillary refill takes less than 2 seconds.  Psychiatric: She has a normal mood and affect. Her behavior is normal.  Nursing note and vitals reviewed.  Assessment & Plan:   1. GAD (generalized anxiety disorder) Moderate today. We will initiate Buspar today. Will evaluate effectiveness at next office visit.  - hydrOXYzine (ATARAX/VISTARIL) 25 MG tablet; Take 1 tablet (25 mg total) by mouth 3 (three) times daily as needed (anxiety).  Dispense: 90 tablet; Refill: 2 - busPIRone (BUSPAR) 5 MG tablet; Take 1 tablet (5 mg total) by mouth 3 (three) times daily.  Dispense: 90 tablet; Refill: 1  2. Severe malnutrition (HCC) Appetite is decreased. She will eat 6 smaller meals daily. We will continue to  monitor.   3. Chronic obstructive pulmonary disease, unspecified COPD type ([place]) No respiratory distress noted or reported today. She will continue inhalers as prescribed. We will refill today.  - mometasone-formoterol (DULERA) 200-5 MCG/ACT AERO; Inhale 2 puffs into the lungs 2 (two) times daily.  Dispense: 0.1 g; Refill: 0 - tiotropium (SPIRIVA HANDIHALER) 18 MCG inhalation capsule; Place 1 capsule (18 mcg total) into inhaler and inhale daily.  Dispense: 30 capsule; Refill: 0  4. HTN (hypertension), malignant Blood pressure is stable today at 140/82  today. Continue to monitor.   5. Chronic pain syndrome She continues to be managed by Pain Management. She will continue Suboxone as prescribed.  6. Follow up She will follow up in 1 month for assessment of effectiveness of Buspar.   Meds ordered this encounter  Medications  . hydrOXYzine (ATARAX/VISTARIL) 25 MG tablet    Sig: Take 1 tablet (25 mg total) by mouth 3 (three) times daily as needed (anxiety).    Dispense:  90 tablet    Refill:  2  . busPIRone (BUSPAR) 5 MG tablet    Sig: Take 1 tablet (5 mg total) by mouth 3 (three) times daily.    Dispense:  90 tablet    Refill:  1  . mometasone-formoterol (DULERA) 200-5 MCG/ACT AERO    Sig: Inhale 2 puffs into the lungs 2 (two) times daily.    Dispense:  0.1 g    Refill:  0  . tiotropium (SPIRIVA HANDIHALER) 18 MCG inhalation capsule    Sig: Place 1 capsule (18 mcg total) into inhaler and inhale daily.    Dispense:  30 capsule    Refill:  0   Kathe Becton,  MSN, FNP-C [facility] [address] [place], [place] [address] [phone]

## 2017-08-08 NOTE — Patient Instructions (Signed)
+Buspirone tablets What is this medicine? BUSPIRONE (byoo SPYE rone) is used to treat anxiety disorders. This medicine may be used for other purposes; ask your health care provider or pharmacist if you have questions. COMMON BRAND NAME(S): BuSpar What should I tell my health care provider before I take this medicine? They need to know if you have any of these conditions: -kidney or liver disease -an unusual or allergic reaction to buspirone, other medicines, foods, dyes, or preservatives -pregnant or trying to get pregnant -breast-feeding How should I use this medicine? Take this medicine by mouth with a glass of water. Follow the directions on the prescription label. You may take this medicine with or without food. To ensure that this medicine always works the same way for you, you should take it either always with or always without food. Take your doses at regular intervals. Do not take your medicine more often than directed. Do not stop taking except on the advice of your doctor or health care professional. Talk to your pediatrician regarding the use of this medicine in children. Special care may be needed. Overdosage: If you think you have taken too much of this medicine contact a poison control center or emergency room at once. NOTE: This medicine is only for you. Do not share this medicine with others. What if I miss a dose? If you miss a dose, take it as soon as you can. If it is almost time for your next dose, take only that dose. Do not take double or extra doses. What may interact with this medicine? Do not take this medicine with any of the following medications: -linezolid -MAOIs like Carbex, Eldepryl, Marplan, Nardil, and Parnate -methylene blue -procarbazine This medicine may also interact with the following medications: -diazepam -digoxin -diltiazem -erythromycin -grapefruit juice -haloperidol -medicines for mental depression or mood problems -medicines for seizures  like carbamazepine, phenobarbital and phenytoin -nefazodone -other medications for anxiety -rifampin -ritonavir -some antifungal medicines like itraconazole, ketoconazole, and voriconazole -verapamil -warfarin This list may not describe all possible interactions. Give your health care provider a list of all the medicines, herbs, non-prescription drugs, or dietary supplements you use. Also tell them if you smoke, drink alcohol, or use illegal drugs. Some items may interact with your medicine. What should I watch for while using this medicine? Visit your doctor or health care professional for regular checks on your progress. It may take 1 to 2 weeks before your anxiety gets better. You may get drowsy or dizzy. Do not drive, use machinery, or do anything that needs mental alertness until you know how this drug affects you. Do not stand or sit up quickly, especially if you are an older patient. This reduces the risk of dizzy or fainting spells. Alcohol can make you more drowsy and dizzy. Avoid alcoholic drinks. What side effects may I notice from receiving this medicine? Side effects that you should report to your doctor or health care professional as soon as possible: -blurred vision or other vision changes -chest pain -confusion -difficulty breathing -feelings of hostility or anger -muscle aches and pains -numbness or tingling in hands or feet -ringing in the ears -skin rash and itching -vomiting -weakness Side effects that usually do not require medical attention (report to your doctor or health care professional if they continue or are bothersome): -disturbed dreams, nightmares -headache -nausea -restlessness or nervousness -sore throat and nasal congestion -stomach upset This list may not describe all possible side effects. Call your doctor for medical advice about side  effects. You may report side effects to FDA at 1-800-FDA-1088. Where should I keep my medicine? Keep out of the  reach of children. Store at room temperature below 30 degrees C (86 degrees F). Protect from light. Keep container tightly closed. Throw away any unused medicine after the expiration date. NOTE: This sheet is a summary. It may not cover all possible information. If you have questions about this medicine, talk to your doctor, pharmacist, or health care provider.  2018 Elsevier/Gold Standard (2009-07-31 18:06:11)

## 2017-08-15 ENCOUNTER — Encounter (INDEPENDENT_AMBULATORY_CARE_PROVIDER_SITE_OTHER): Payer: Medicaid Other | Admitting: Physical Medicine and Rehabilitation

## 2017-08-16 ENCOUNTER — Telehealth (INDEPENDENT_AMBULATORY_CARE_PROVIDER_SITE_OTHER): Payer: Self-pay | Admitting: *Deleted

## 2017-08-20 ENCOUNTER — Encounter (HOSPITAL_COMMUNITY): Payer: Self-pay | Admitting: Emergency Medicine

## 2017-08-20 ENCOUNTER — Emergency Department (HOSPITAL_COMMUNITY)
Admission: EM | Admit: 2017-08-20 | Discharge: 2017-08-20 | Disposition: A | Discharge status: Home / Self Care | Payer: Medicaid Other | Attending: Emergency Medicine | Admitting: Emergency Medicine

## 2017-08-20 ENCOUNTER — Other Ambulatory Visit: Payer: Self-pay

## 2017-08-20 DIAGNOSIS — I10 Essential (primary) hypertension: Secondary | ICD-10-CM | POA: Insufficient documentation

## 2017-08-20 DIAGNOSIS — J449 Chronic obstructive pulmonary disease, unspecified: Secondary | ICD-10-CM | POA: Diagnosis not present

## 2017-08-20 DIAGNOSIS — F17210 Nicotine dependence, cigarettes, uncomplicated: Secondary | ICD-10-CM | POA: Insufficient documentation

## 2017-08-20 DIAGNOSIS — Z79899 Other long term (current) drug therapy: Secondary | ICD-10-CM | POA: Diagnosis not present

## 2017-08-20 DIAGNOSIS — R1013 Epigastric pain: Secondary | ICD-10-CM | POA: Insufficient documentation

## 2017-08-20 LAB — COMPREHENSIVE METABOLIC PANEL
ALBUMIN: 3.3 g/dL — AB (ref 3.5–5.0)
ALK PHOS: 80 U/L (ref 38–126)
ALT: 8 U/L (ref 0–44)
AST: 15 U/L (ref 15–41)
Anion gap: 11 (ref 5–15)
BILIRUBIN TOTAL: 0.7 mg/dL (ref 0.3–1.2)
BUN: 23 mg/dL (ref 8–23)
CALCIUM: 9.1 mg/dL (ref 8.9–10.3)
CO2: 20 mmol/L — AB (ref 22–32)
CREATININE: 0.92 mg/dL (ref 0.44–1.00)
Chloride: 109 mmol/L (ref 98–111)
GFR calc Af Amer: >60 mL/min (ref >60)
GFR calc non Af Amer: >60 mL/min (ref >60)
GLUCOSE: 63 mg/dL — AB (ref 70–99)
Potassium: 4.6 mmol/L (ref 3.5–5.1)
Sodium: 140 mmol/L (ref 135–145)
TOTAL PROTEIN: 5.9 g/dL — AB (ref 6.5–8.1)

## 2017-08-20 LAB — CBC
HEMATOCRIT: 39.4 % (ref 36.0–46.0)
HEMOGLOBIN: 13.2 g/dL (ref 12.0–15.0)
MCH: 29.4 pg (ref 26.0–34.0)
MCHC: 33.5 g/dL (ref 30.0–36.0)
MCV: 87.8 fL (ref 78.0–100.0)
Platelets: 304 10*3/uL (ref 150–400)
RBC: 4.49 MIL/uL (ref 3.87–5.11)
RDW: 13.9 % (ref 11.5–15.5)
WBC: 9 10*3/uL (ref 4.0–10.5)

## 2017-08-20 LAB — LIPASE, BLOOD: Lipase: 23 U/L (ref 11–51)

## 2017-08-20 MED ORDER — SODIUM CHLORIDE 0.9 % IV BOLUS
1000.0000 mL | Freq: Once | INTRAVENOUS | Status: AC
  Administered 2017-08-20: 1000 mL via INTRAVENOUS

## 2017-08-20 MED ORDER — MORPHINE SULFATE (PF) 4 MG/ML IV SOLN
4.0000 mg | Freq: Once | INTRAVENOUS | Status: AC
  Administered 2017-08-20: 4 mg via INTRAVENOUS
  Filled 2017-08-20: qty 1

## 2017-08-20 NOTE — ED Triage Notes (Signed)
Per EMS, patient from home, c/o generalized stabbing abdominal pain x3 weeks. Denies N/V/D. Hx chronic pancreatitis and cirrhosis.

## 2017-08-20 NOTE — ED Notes (Signed)
Patient's husband called to transport patient home

## 2017-08-20 NOTE — Discharge Instructions (Addendum)
As discussed, today's evaluation was generally reassuring, there is suspicion for an acute exacerbation of your chronic medical conditions contributing to your discomfort. For the next 48 hours please maintain a clear liquid diet, then slowly advance to a normal diet over the next few days. Please be sure to follow-up with your primary care physician.  Return here for concerning changes in your condition.

## 2017-08-20 NOTE — ED Provider Notes (Signed)
[facility] DEPT Provider Note   CSN: [id number] Arrival date & time: 08/20/17  1425     History   Chief Complaint Chief Complaint  Patient presents with  . Abdominal Pain    HPI [name] is a 67 y.o. female.  HPI Patient presents with abdominal pain, dyspnea. Onset was about 3 weeks ago. Since onset symptoms have been persistent, worsening, with no clear alleviating or exacerbating factors. There is associated nausea, generalized discomfort. No objective fever, though she notes warm sensation. Patient acknowledges a long history of alcohol abuse, as well as ongoing cigarette addiction. She states that she is a history of chronic pancreatitis.  Past Medical History:  Diagnosis Date  . Cancer ([place])    renal ca  . COPD (chronic obstructive pulmonary disease) ([place])   . Drug-seeking behavior   . Pancreatitis   . Pancreatitis   . Seizures ([place])   . Substance abuse [facility])     Patient Active Problem List   Diagnosis Date Noted  . Osteoarthritis of facet joint of lumbar spine 07/26/2017  . Low back pain radiating to both legs 07/26/2017  . 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 Failure to thrive in adult 06/21/2017  . Pressure injury of skin 04/06/2017  . Pressure injury of skin of right ischial tuberosity region: Stage 2 04/06/2017  . Acute urinary retention 04/04/2017  . Benzodiazepine dependence ([place])   . AKI (acute kidney injury) ([place]) 04/03/2017  . Hip fracture, right ([place]) 06/02/2016  . Narcotic addiction ([place]) 02/29/2016  . Abdominal pain, chronic, epigastric- due to chronic pancreatitis 02/29/2016  . COPD (chronic obstructive pulmonary disease) ([place]) 02/28/2016  . Cough 09/03/2015  . Abdominal pain 01/03/2015  . Hyponatremia 01/03/2015  . Hypocalcemia 01/03/2015  . Underweight 01/03/2015  . COPD GOLD III with reversible component  07/17/2014  . DTs (delirium tremens) ([place]) 06/02/2013  .  Chronic alcoholic pancreatitis ([place]) 06/02/2013  . Lactic acidosis 06/02/2013  . Recurrent acute pancreatitis 03/28/2013  . Alcohol withdrawal ([place]) 03/28/2013  . Pancreatitis 03/09/2013  . Tobacco abuse 03/09/2013  . Alcohol withdrawal syndrome with perceptual disturbance ([place]) 03/09/2013  . Acute alcoholic pancreatitis 71/24/5809  . Benzodiazepine withdrawal ([place]) 02/09/2013  . Protein-calorie malnutrition, severe ([place]) 02/09/2013  . Hypokalemia 02/27/2011  . Abdominal pain, acute 02/25/2011  . Nausea and vomiting 02/25/2011  . Thrombocytopenia ([place]) 02/25/2011  . COPD with acute exacerbation ([place]) 02/25/2011  . HTN (hypertension), malignant 02/25/2011  . KIDNEY CANCER 10/18/2008  . Anxiety state 10/18/2008  . GERD 10/18/2008  . Hepatic cirrhosis ([place]) 10/18/2008  . PANCREATITIS, CHRONIC- atrophic pancreas 10/18/2008  . DYSPNEA 10/18/2008  . GASTRIC ULCER, HX OF 10/18/2008    Past Surgical History:  Procedure Laterality Date  . IR GENERIC HISTORICAL  05/08/2015   IR RADIOLOGIST EVAL & MGMT 05/08/2015 Aletta Edouard, MD [facility] INTERV RAD  . IR GENERIC HISTORICAL  01/08/2014   IR RADIOLOGIST EVAL & MGMT 01/08/2014 Aletta Edouard, MD [facility] INTERV RAD  . KIDNEY SURGERY     removed cancerous lesions  . PARTIAL GASTRECTOMY       OB History   None      Home Medications    Prior to Admission medications   Medication Sig Start Date End Date Taking? Authorizing Provider  busPIRone (BUSPAR) 5 MG tablet Take 1 tablet (5 mg total) by mouth 3 (three) times daily. 08/08/17  Yes Azzie Glatter, FNP  hydrOXYzine (ATARAX/VISTARIL) 25 MG tablet Take 1 tablet (25 mg  total) by mouth 3 (three) times daily as needed (anxiety). 08/08/17  Yes Azzie Glatter, FNP  mometasone-formoterol (DULERA) 200-5 MCG/ACT AERO Inhale 2 puffs into the lungs 2 (two) times daily. 08/08/17 09/07/17 Yes Azzie Glatter, FNP  PROAIR HFA 108 [phone] Base) MCG/ACT inhaler Take 90 Inhalers by mouth. 08/02/17  Yes [provider]  SUBOXONE 8-2 MG FILM Take 1 Film by mouth. 07/30/17  Yes [provider]  tiotropium (SPIRIVA HANDIHALER) 18 MCG inhalation capsule Place 1 capsule (18 mcg total) into inhaler and inhale daily. 08/08/17  Yes Azzie Glatter, FNP    Family History Family History  Problem Relation Age of Onset  . CAD Mother   . Alcoholism Father   . COPD Father     Social History Social History   Tobacco Use  . Smoking status: Current Every Day Smoker    Packs/day: 1.00    Years: 30.00    Pack years: 30.00    Types: Cigarettes  . Smokeless tobacco: Never Used  . Tobacco comment: smoking up to 1.5 ppd per husband.   Substance Use Topics  . Alcohol use: Not Currently    Alcohol/week: 0.0 standard drinks  . Drug use: No    Comment: Hx of polysubstance drug abuse     Allergies   Aspirin and Chlorpromazine hcl   Review of Systems Review of Systems  Constitutional:       Per HPI, otherwise negative  HENT:       Per HPI, otherwise negative  Respiratory:       Per HPI, otherwise negative  Cardiovascular:       Per HPI, otherwise negative  Gastrointestinal: Positive for nausea. Negative for vomiting.  Endocrine:       Negative aside from HPI  Genitourinary:       Neg aside from HPI   Musculoskeletal:       Per HPI, otherwise negative  Skin: Negative.   Neurological: Positive for weakness. Negative for syncope.     Physical Exam Updated Vital Signs BP 130/87 (BP Location: Left Arm)   Pulse 72   Temp 98.8 F (37.1 C)   Resp 18   SpO2 97%   Physical Exam  Constitutional: She is oriented to person, place, and time. She has a sickly appearance. She appears ill. No distress.  Frail female awake and alert, appears substantially greater than stated age.  HENT:  Head: Normocephalic and atraumatic.  Eyes: Conjunctivae and EOM are normal.  Cardiovascular: Regular rhythm. Tachycardia present.  Pulmonary/Chest: Effort normal and breath sounds normal. No  stridor. No respiratory distress.  Abdominal: She exhibits no distension.  Musculoskeletal: She exhibits no edema.  Neurological: She is alert and oriented to person, place, and time. She displays atrophy. No cranial nerve deficit.  Skin: Skin is warm and dry.  Psychiatric: Her mood appears anxious.  Nursing note and vitals reviewed.    ED Treatments / Results  Labs (all labs ordered are listed, but only abnormal results are displayed) Labs Reviewed  COMPREHENSIVE METABOLIC PANEL - Abnormal; Notable for the following components:      Result Value   CO2 20 (*)    Glucose, Bld 63 (*)    Total Protein 5.9 (*)    Albumin 3.3 (*)    All other components within normal limits  CBC  LIPASE, BLOOD  URINALYSIS, ROUTINE W REFLEX MICROSCOPIC    EKG None  Radiology No results found.  Procedures Procedures (including critical care time)  Medications Ordered in ED Medications  sodium chloride 0.9 % bolus 1,000 mL (0 mLs Intravenous Stopped 08/20/17 1622)  morphine 4 MG/ML injection 4 mg (4 mg Intravenous Given 08/20/17 1550)  morphine 4 MG/ML injection 4 mg (4 mg Intravenous Given 08/20/17 1822)     Initial Impression / Assessment and Plan / ED Course  I have reviewed the triage vital signs and the nursing notes.  Pertinent labs & imaging results that were available during my care of the patient were reviewed by me and considered in my medical decision making (see chart for details).      8:50 PM Patient calm, watching television, in no distress, hemodynamically unremarkable, with no persistent tachycardia. I reviewed patient's labs with her, including absence of evidence for substantial abnormalities, no elevated lipase, reassuring electrolytes. Patient has a history of chronic pain, there is suspicion for acute on chronic episode given the reassuring findings, reassuring vital signs. Given her improvement here, encouraged her to maintain a clear liquid diet for the coming days,  then advance her diet slowly, and be sure to follow-up with primary care.   Final Clinical Impressions(s) / ED Diagnoses   Final diagnoses:  Epigastric pain     Carmin Muskrat, MD 08/20/17 2050

## 2017-08-20 NOTE — ED Notes (Signed)
Called house phlebotomy to come assist with lab CMP recollect.

## 2017-08-21 ENCOUNTER — Other Ambulatory Visit: Payer: Self-pay | Admitting: Family Medicine

## 2017-08-22 ENCOUNTER — Encounter (HOSPITAL_COMMUNITY): Payer: Self-pay | Admitting: Emergency Medicine

## 2017-08-22 ENCOUNTER — Telehealth: Payer: Self-pay

## 2017-08-22 ENCOUNTER — Emergency Department (HOSPITAL_COMMUNITY)
Admission: EM | Admit: 2017-08-22 | Discharge: 2017-08-22 | Disposition: A | Discharge status: Home / Self Care | Payer: Medicaid Other | Attending: Emergency Medicine | Admitting: Emergency Medicine

## 2017-08-22 ENCOUNTER — Emergency Department (HOSPITAL_COMMUNITY): Payer: Medicaid Other

## 2017-08-22 ENCOUNTER — Other Ambulatory Visit: Payer: Self-pay

## 2017-08-22 DIAGNOSIS — Z85528 Personal history of other malignant neoplasm of kidney: Secondary | ICD-10-CM | POA: Diagnosis not present

## 2017-08-22 DIAGNOSIS — G8929 Other chronic pain: Secondary | ICD-10-CM | POA: Insufficient documentation

## 2017-08-22 DIAGNOSIS — J449 Chronic obstructive pulmonary disease, unspecified: Secondary | ICD-10-CM | POA: Insufficient documentation

## 2017-08-22 DIAGNOSIS — Z903 Acquired absence of stomach [part of]: Secondary | ICD-10-CM | POA: Diagnosis not present

## 2017-08-22 DIAGNOSIS — Z79899 Other long term (current) drug therapy: Secondary | ICD-10-CM | POA: Insufficient documentation

## 2017-08-22 DIAGNOSIS — I10 Essential (primary) hypertension: Secondary | ICD-10-CM | POA: Diagnosis not present

## 2017-08-22 DIAGNOSIS — F17210 Nicotine dependence, cigarettes, uncomplicated: Secondary | ICD-10-CM | POA: Insufficient documentation

## 2017-08-22 DIAGNOSIS — R109 Unspecified abdominal pain: Secondary | ICD-10-CM

## 2017-08-22 DIAGNOSIS — R1084 Generalized abdominal pain: Secondary | ICD-10-CM | POA: Diagnosis not present

## 2017-08-22 DIAGNOSIS — R911 Solitary pulmonary nodule: Secondary | ICD-10-CM | POA: Diagnosis not present

## 2017-08-22 LAB — CBC WITH DIFFERENTIAL/PLATELET
Basophils Absolute: 0 10*3/uL (ref 0.0–0.1)
Basophils Relative: 0 %
Eosinophils Absolute: 0.1 10*3/uL (ref 0.0–0.7)
Eosinophils Relative: 1 %
HEMATOCRIT: 37 % (ref 36.0–46.0)
HEMOGLOBIN: 12.3 g/dL (ref 12.0–15.0)
LYMPHS ABS: 1.6 10*3/uL (ref 0.7–4.0)
LYMPHS PCT: 18 %
MCH: 29.6 pg (ref 26.0–34.0)
MCHC: 33.2 g/dL (ref 30.0–36.0)
MCV: 89.2 fL (ref 78.0–100.0)
Monocytes Absolute: 0.7 10*3/uL (ref 0.1–1.0)
Monocytes Relative: 7 %
NEUTROS PCT: 74 %
Neutro Abs: 6.6 10*3/uL (ref 1.7–7.7)
Platelets: 282 10*3/uL (ref 150–400)
RBC: 4.15 MIL/uL (ref 3.87–5.11)
RDW: 14.1 % (ref 11.5–15.5)
WBC: 9 10*3/uL (ref 4.0–10.5)

## 2017-08-22 LAB — COMPREHENSIVE METABOLIC PANEL
ALT: 12 U/L (ref 0–44)
AST: 19 U/L (ref 15–41)
Albumin: 3.8 g/dL (ref 3.5–5.0)
Alkaline Phosphatase: 92 U/L (ref 38–126)
Anion gap: 11 (ref 5–15)
BUN: 12 mg/dL (ref 8–23)
CHLORIDE: 105 mmol/L (ref 98–111)
CO2: 26 mmol/L (ref 22–32)
CREATININE: 0.82 mg/dL (ref 0.44–1.00)
Calcium: 9.8 mg/dL (ref 8.9–10.3)
Glucose, Bld: 60 mg/dL — ABNORMAL LOW (ref 70–99)
POTASSIUM: 3.5 mmol/L (ref 3.5–5.1)
SODIUM: 142 mmol/L (ref 135–145)
Total Bilirubin: 0.3 mg/dL (ref 0.3–1.2)
Total Protein: 6.8 g/dL (ref 6.5–8.1)

## 2017-08-22 LAB — LIPASE, BLOOD: Lipase: 25 U/L (ref 11–51)

## 2017-08-22 MED ORDER — IOHEXOL 300 MG/ML  SOLN
30.0000 mL | Freq: Once | INTRAMUSCULAR | Status: AC | PRN
  Administered 2017-08-22: 30 mL via ORAL

## 2017-08-22 MED ORDER — SODIUM CHLORIDE 0.9 % IV BOLUS
1000.0000 mL | Freq: Once | INTRAVENOUS | Status: AC
  Administered 2017-08-22: 1000 mL via INTRAVENOUS

## 2017-08-22 MED ORDER — ONDANSETRON HCL 4 MG/2ML IJ SOLN
4.0000 mg | Freq: Once | INTRAMUSCULAR | Status: AC
  Administered 2017-08-22: 4 mg via INTRAVENOUS
  Filled 2017-08-22: qty 2

## 2017-08-22 MED ORDER — FENTANYL CITRATE (PF) 100 MCG/2ML IJ SOLN
50.0000 ug | Freq: Once | INTRAMUSCULAR | Status: AC
Start: 2017-08-22 — End: 2017-08-22
  Administered 2017-08-22: 50 ug via INTRAVENOUS
  Filled 2017-08-22: qty 2

## 2017-08-22 MED ORDER — IOHEXOL 300 MG/ML  SOLN
75.0000 mL | Freq: Once | INTRAMUSCULAR | Status: AC | PRN
  Administered 2017-08-22: 75 mL via INTRAVENOUS

## 2017-08-22 MED ORDER — FAMOTIDINE 20 MG PO TABS: 20.0000 mg | ORAL_TABLET | Freq: Every day | ORAL | 0 refills | Status: DC

## 2017-08-22 NOTE — Discharge Instructions (Addendum)
You have a lung nodule that has gotten much larger in size. Given your history of weight loss and smoking there is a strong concern for cancer and YOU MUST FOLLOW UP FOR A CT SCAN OF YOU CHEST WITH YOUR PRIMARY CARE DOCTOR.  I have sent a message to your doctor to help you follow up, but you will also need to call first thing to tomorrow to have schedule a follow up appointment and for any further pain control needs.   Abdominal (belly) pain can be caused by many things. Your caregiver performed an examination and possibly ordered blood/urine tests and imaging (CT scan, x-rays, ultrasound). Many cases can be observed and treated at home after initial evaluation in the emergency department. Even though you are being discharged home, abdominal pain can be unpredictable. Therefore, you need a repeated exam if your pain does not resolve, returns, or worsens. Most patients with abdominal pain don't have to be admitted to the hospital or have surgery, but serious problems like appendicitis and gallbladder attacks can start out as nonspecific pain. Many abdominal conditions cannot be diagnosed in one visit, so follow-up evaluations are very important. SEEK IMMEDIATE MEDICAL ATTENTION IF: The pain does not go away or becomes severe.  A temperature above 101 develops.  Repeated vomiting occurs (multiple episodes).  The pain becomes localized to portions of the abdomen. The right side could possibly be appendicitis. In an adult, the left lower portion of the abdomen could be colitis or diverticulitis.  Blood is being passed in stools or vomit (bright red or black tarry stools).  Return also if you develop chest pain, difficulty breathing, dizziness or fainting, or become confused, poorly responsive, or inconsolable (young children).

## 2017-08-22 NOTE — ED Notes (Signed)
Called [phone], left message for Oliver, [employer], requesting return call.  HIPAA compliant message left.

## 2017-08-22 NOTE — ED Provider Notes (Signed)
[facility] DEPT Provider Note   CSN: [id number] Arrival date & time: 08/22/17  1426     History   Chief Complaint Chief Complaint  Patient presents with  . Leg Pain  . not eating  . Back Pain    HPI [name] is a 67 y.o. female who  has a past medical history of Cancer ([place]), COPD (chronic obstructive pulmonary disease) ([place]), Drug-seeking behavior, Pancreatitis, Pancreatitis, Seizures ([place]), and Substance abuse ([place]).  Patient also has a previous surgical history of gastrectomy after perforated ulcer.  Patient presents with chief complaint of abdominal pain.  She states that she has lost 10 pounds in the last week.  Review of her chart shows that that is not accurate.  Patient complains that her abdomen is killing her she is severe generalized abdominal pain is unable to eat.  She is also complaining of pain that is now up into her rib cage but denies nausea, vomiting, diarrhea or fevers.  She is tearful.   HPI  Past Medical History:  Diagnosis Date  . Cancer ([place])    renal ca  . COPD (chronic obstructive pulmonary disease) ([place])   . Drug-seeking behavior   . Pancreatitis   . Pancreatitis   . Seizures ([place])   . Substance abuse [facility])     Patient Active Problem List   Diagnosis Date Noted  . Osteoarthritis of facet joint of lumbar spine 07/26/2017  . Low back pain radiating to both legs 07/26/2017  . 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 Failure to thrive in adult 06/21/2017  . Pressure injury of skin 04/06/2017  . Pressure injury of skin of right ischial tuberosity region: Stage 2 04/06/2017  . Acute urinary retention 04/04/2017  . Benzodiazepine dependence ([place])   . AKI (acute kidney injury) ([place]) 04/03/2017  . Hip fracture, right ([place]) 06/02/2016  . Narcotic addiction ([place]) 02/29/2016  . Abdominal pain, chronic, epigastric- due to chronic pancreatitis 02/29/2016  . COPD (chronic obstructive  pulmonary disease) ([place]) 02/28/2016  . Cough 09/03/2015  . Abdominal pain 01/03/2015  . Hyponatremia 01/03/2015  . Hypocalcemia 01/03/2015  . Underweight 01/03/2015  . COPD GOLD III with reversible component  07/17/2014  . DTs (delirium tremens) ([place]) 06/02/2013  . Chronic alcoholic pancreatitis ([place]) 06/02/2013  . Lactic acidosis 06/02/2013  . Recurrent acute pancreatitis 03/28/2013  . Alcohol withdrawal ([place]) 03/28/2013  . Pancreatitis 03/09/2013  . Tobacco abuse 03/09/2013  . Alcohol withdrawal syndrome with perceptual disturbance ([place]) 03/09/2013  . Acute alcoholic pancreatitis 12/75/1700  . Benzodiazepine withdrawal ([place]) 02/09/2013  . Protein-calorie malnutrition, severe ([place]) 02/09/2013  . Hypokalemia 02/27/2011  . Abdominal pain, acute 02/25/2011  . Nausea and vomiting 02/25/2011  . Thrombocytopenia ([place]) 02/25/2011  . COPD with acute exacerbation ([place]) 02/25/2011  . HTN (hypertension), malignant 02/25/2011  . KIDNEY CANCER 10/18/2008  . Anxiety state 10/18/2008  . GERD 10/18/2008  . Hepatic cirrhosis ([place]) 10/18/2008  . PANCREATITIS, CHRONIC- atrophic pancreas 10/18/2008  . DYSPNEA 10/18/2008  . GASTRIC ULCER, HX OF 10/18/2008    Past Surgical History:  Procedure Laterality Date  . IR GENERIC HISTORICAL  05/08/2015   IR RADIOLOGIST EVAL & MGMT 05/08/2015 Aletta Edouard, MD [facility] INTERV RAD  . IR GENERIC HISTORICAL  01/08/2014   IR RADIOLOGIST EVAL & MGMT 01/08/2014 Aletta Edouard, MD [facility] INTERV RAD  . KIDNEY SURGERY     removed cancerous lesions  . PARTIAL GASTRECTOMY       OB History  None      Home Medications    Prior to Admission medications   Medication Sig Start Date End Date Taking? Authorizing Provider  busPIRone (BUSPAR) 5 MG tablet Take 1 tablet (5 mg total) by mouth 3 (three) times daily. 08/08/17  Yes Azzie Glatter, FNP  hydrOXYzine (ATARAX/VISTARIL) 25 MG tablet Take 1 tablet (25 mg total) by mouth 3 (three) times daily as needed (anxiety).  08/08/17  Yes Azzie Glatter, FNP  mometasone-formoterol (DULERA) 200-5 MCG/ACT AERO Inhale 2 puffs into the lungs 2 (two) times daily. 08/08/17 09/07/17 Yes Azzie Glatter, FNP  PROAIR HFA 108 [phone] Base) MCG/ACT inhaler Inhale 90 Inhalers into the lungs every 4 (four) hours as needed for wheezing or shortness of breath.  08/02/17  Yes [provider]  SUBOXONE 8-2 MG FILM Take 1 Film by mouth daily.  07/30/17  Yes [provider]  tiotropium (SPIRIVA HANDIHALER) 18 MCG inhalation capsule Place 1 capsule (18 mcg total) into inhaler and inhale daily. 08/08/17  Yes Azzie Glatter, FNP  famotidine (PEPCID) 20 MG tablet Take 1 tablet (20 mg total) by mouth daily. 08/22/17   Margarita Mail, PA-C    Family History Family History  Problem Relation Age of Onset  . CAD Mother   . Alcoholism Father   . COPD Father     Social History Social History   Tobacco Use  . Smoking status: Current Every Day Smoker    Packs/day: 1.00    Years: 30.00    Pack years: 30.00    Types: Cigarettes  . Smokeless tobacco: Never Used  . Tobacco comment: smoking up to 1.5 ppd per husband.   Substance Use Topics  . Alcohol use: Not Currently    Alcohol/week: 0.0 standard drinks  . Drug use: No    Comment: Hx of polysubstance drug abuse     Allergies   Aspirin and Chlorpromazine hcl   Review of Systems Review of Systems  Ten systems reviewed and are negative for acute change, except as noted in the HPI.   Physical Exam Updated Vital Signs BP 105/76   Pulse 78   Temp 98.6 F (37 C) (Oral)   Resp 18   Ht 5\' 4"  (1.626 m)   Wt 31.8 kg   SpO2 98%   BMI 12.02 kg/m   Physical Exam  Constitutional: She is oriented to person, place, and time.  Cachectic female Tearful  HENT:  Head: Normocephalic and atraumatic.  Bilateral angular chelitis  Eyes: Pupils are equal, round, and reactive to light. EOM are normal.  Neck: Normal range of motion. Neck supple.  Cardiovascular: Normal  rate and regular rhythm.  Pulmonary/Chest: Effort normal.  Abdominal: Soft. There is tenderness. There is guarding.  Scaphoid abdomen with palpable staples along the upper rectus, exquisitely tender to palpation with guarding throughout  Neurological: She is alert and oriented to person, place, and time.  Skin: Skin is warm and dry.  Nursing note and vitals reviewed.    ED Treatments / Results  Labs (all labs ordered are listed, but only abnormal results are displayed) Labs Reviewed  COMPREHENSIVE METABOLIC PANEL - Abnormal; Notable for the following components:      Result Value   Glucose, Bld 60 (*)    All other components within normal limits  CBC WITH DIFFERENTIAL/PLATELET  LIPASE, BLOOD  URINALYSIS, ROUTINE W REFLEX MICROSCOPIC    EKG None  Radiology Ct Abdomen Pelvis W Contrast  Result Date: 08/22/2017 CLINICAL DATA:  Acute  abdominal pain. Patient reports back and leg pain. Decreased appetite. EXAM: CT ABDOMEN AND PELVIS WITH CONTRAST TECHNIQUE: Multidetector CT imaging of the abdomen and pelvis was performed using the standard protocol following bolus administration of intravenous contrast. CONTRAST:  45mL OMNIPAQUE IOHEXOL 300 MG/ML  SOLN COMPARISON:  Chest, abdomen, and pelvis CT 01/15/2017, 02/05/2017, prior chest radiographs FINDINGS: Lower chest: Pulmonary nodule with central low density in the left lower lobe measures 2.5 cm, only partially included in the field of view. This abuts the pleura posteriorly. This likely represents increase size of a 3 mm nodule on prior exam. Hepatobiliary: Unchanged intra and extrahepatic biliary ductal dilatation, common bile duct measuring approximately 15 mm. A calcified gallstone or visualized choledocholithiasis. Gallbladder is partially distended. No focal hepatic lesion. Pancreas: Atrophic with parenchyma not well visualized. No peripancreatic inflammation. Spleen: Normal in size without focal abnormality. Adrenals/Urinary Tract: No  adrenal nodule. Stable appearance of post ablation defect in the lower left kidney with dystrophic calcification. No hydronephrosis. No perinephric edema. No new renal lesion allowing for mild motion artifact. Urinary bladder is physiologically distended, no bladder wall thickening. Stomach/Bowel: Paucity of intra-abdominal fat limits detailed bowel evaluation. Small hiatal hernia. Fluid in the stomach with equivocal gastric wall thickening. Surgical clips along the lesser curvature of stomach. No bowel obstruction, evidence of wall thickening or inflammatory change. Appendix not visualized. Vascular/Lymphatic: Advanced aortic and branch atherosclerosis. Atherosclerotic calcification of the origin of both renal arteries. No acute vascular findings. No bulky adenopathy, evaluation for adenopathy is limited due to paucity of intra-abdominal fat. Reproductive: Uterus not definitively visualized.  No adnexal mass. Other: No free air or free fluid. Subcutaneous calcifications in the left greater than right flank, unchanged and incidental. Musculoskeletal: Bones are under mineralized. Degenerative disc disease throughout the lumbar spine. No blastic osseous lesion. Evaluation for lytic lesions limited given underlying bony under mineralization. Bilateral hip pinning. IMPRESSION: 1. Fluid in the stomach with possible gastric enhancement, may represent gastritis. 2. Partially included 2.5 cm left lower lobe pulmonary nodule, retrospectively increasing since CT 6 months prior were is faintly visualized. This may represent metastasis given history of renal cancer versus primary bronchogenic malignancy. Recommend complete chest evaluation with dedicated chest CT, this could be performed on a nonemergent basis based on patient's symptoms. 3. Chronic biliary dilatation, unchanged from prior exams. 4.  Aortic Atherosclerosis (ICD10-I70.0). Electronically Signed   By: Jeb Levering M.D.   On: 08/22/2017 22:26     Procedures Procedures (including critical care time)  Medications Ordered in ED Medications  sodium chloride 0.9 % bolus 1,000 mL (0 mLs Intravenous Stopped 08/22/17 2115)  fentaNYL (SUBLIMAZE) injection 50 mcg (50 mcg Intravenous Given 08/22/17 2014)  ondansetron (ZOFRAN) injection 4 mg (4 mg Intravenous Given 08/22/17 2014)  iohexol (OMNIPAQUE) 300 MG/ML solution 30 mL (30 mLs Oral Contrast Given 08/22/17 2151)  iohexol (OMNIPAQUE) 300 MG/ML solution 75 mL (75 mLs Intravenous Contrast Given 08/22/17 2149)     Initial Impression / Assessment and Plan / ED Course  I have reviewed the triage vital signs and the nursing notes.  Pertinent labs & imaging results that were available during my care of the patient were reviewed by me and considered in my medical decision making (see chart for details).     This is a 45 pound 67 year old female.  Complaining of severe pain and weight loss.  CT abdomen does not show any acute abnormalities however there does appear to be a left lower lobe lung nodule which was previously 3  mm and is now 2.5 cm.  CT scan also shows some inflammatory changes in the thymic which may be consistent with diagnosis of gastritis and patient will be discharged with Pepcid.  Given her severe pain, continued smoking history and weight loss this is obviously very concerning for potential metastatic or primary lung cancer.  I have sent an inbox message to the patient's primary care physician to ensure close follow-up.  I have also advised the patient in writing in her discharge paperwork of her pulmonary nodule with the concern for cancer and she will need to call her PCP tomorrow to establish follow-up along with needs for any further pain control.  Final Clinical Impressions(s) / ED Diagnoses   Final diagnoses:  Chronic abdominal pain  Incidental lung nodule    ED Discharge Orders         Ordered    famotidine (PEPCID) 20 MG tablet  Daily     08/22/17 2308            Margarita Mail, PA-C 08/22/17 2323    Lacretia Leigh, MD 08/22/17 [phone]

## 2017-08-22 NOTE — ED Notes (Addendum)
Patient transported to CT via w/c.

## 2017-08-22 NOTE — ED Triage Notes (Signed)
Pt brought in by her husband who states that he took her to her psychiatrist who is furious that patient comes to ED for help and we discharge her instead of helping her.  Gave a business card for [employer] [phone] and would like to be called before pt discharged.  Pt c/o back pain and leg pain no not eating due to no appetite.

## 2017-08-22 NOTE — ED Notes (Signed)
Pt requested to be taken outside after d/c stating "I want to go smoke."

## 2017-08-23 ENCOUNTER — Other Ambulatory Visit: Payer: Self-pay | Admitting: Family Medicine

## 2017-08-23 ENCOUNTER — Encounter: Payer: Self-pay | Admitting: Family Medicine

## 2017-08-23 ENCOUNTER — Ambulatory Visit (INDEPENDENT_AMBULATORY_CARE_PROVIDER_SITE_OTHER): Payer: Medicaid Other | Admitting: Family Medicine

## 2017-08-23 VITALS — BP 132/90 | HR 68 | Temp 97.6°F | Ht 64.0 in | Wt <= 1120 oz

## 2017-08-23 DIAGNOSIS — F411 Generalized anxiety disorder: Secondary | ICD-10-CM

## 2017-08-23 DIAGNOSIS — R0602 Shortness of breath: Secondary | ICD-10-CM | POA: Diagnosis not present

## 2017-08-23 DIAGNOSIS — J449 Chronic obstructive pulmonary disease, unspecified: Secondary | ICD-10-CM | POA: Diagnosis not present

## 2017-08-23 DIAGNOSIS — R112 Nausea with vomiting, unspecified: Secondary | ICD-10-CM | POA: Diagnosis not present

## 2017-08-23 DIAGNOSIS — R911 Solitary pulmonary nodule: Secondary | ICD-10-CM

## 2017-08-23 DIAGNOSIS — Z09 Encounter for follow-up examination after completed treatment for conditions other than malignant neoplasm: Secondary | ICD-10-CM

## 2017-08-23 MED ORDER — BUSPIRONE HCL 10 MG PO TABS
10.0000 mg | ORAL_TABLET | Freq: Three times a day (TID) | ORAL | 2 refills | Status: DC
Start: 2017-08-23 — End: 2017-09-24

## 2017-08-23 MED ORDER — ONDANSETRON HCL 4 MG PO TABS: 4.0000 mg | ORAL_TABLET | Freq: Three times a day (TID) | ORAL | 6 refills | Status: DC | PRN

## 2017-08-23 NOTE — Progress Notes (Addendum)
Hospital Follow Up  Subjective:    Patient ID: [name], female    DOB: 1950/05/30, 67 y.o.   MRN: [id number]   Chief Complaint  Patient presents with  . Hospitalization Follow-up   HPI [name] has a past medical history of COPD, Substance Abuse, Pancreatitis, Seizures, and Drug Seeking Behavior. She is here for hospital follow up.  Current status: Since her last office visit, she has had a doing well with no complaints. Her oxygen level is 74% at rest on room air today. She is diagnosed with COPD. She has been using ProAir for shortness of breath.   She had an ED visit yesterday (08/22/2017) with c/o increased anxiety and generalized chronic pain. She states that Suboxone is not helping her pain. CT scan revealed increased size of lower left pulmonary nodule.   She denies fevers, chills, fatigue, recent infections, weight loss, and night sweats.   She has not had any headaches, visual changes, dizziness, and falls.   No chest pain, heart palpitations, cough and shortness of breath reported.   She has had episodes of nausea. No reports of any other GI problems [name] as vomiting, diarrhea, and constipation. She has no reports of blood in stools, dysuria and hematuria.   She has increased anxiety. She denies suicidal ideations, homicidal ideations, or auditory hallucinations.   She is accompanied today by her husband.    Review of Systems  Respiratory: Positive for shortness of breath.   Gastrointestinal: Positive for nausea (occasional).  Musculoskeletal:       Bilateral rib pain  Psychiatric/Behavioral: Positive for agitation. The patient is nervous/anxious and is hyperactive.    Objective:   Physical Exam  Cardiovascular: Normal rate, regular rhythm, normal heart sounds and intact distal pulses.  Pulmonary/Chest: Effort normal and breath sounds normal.  Abdominal: Soft.   Assessment & Plan:   1. Chronic obstructive pulmonary disease, unspecified COPD type  ([place]) Former smoker. Decreased Oxygen Sats today. Placed on portable oxygen at 2 liters in office today.   2. Shortness of breath O2 Sats are 74% today. We will fax order for portable oxygen at 2 liters, to [place] today. She will continue Inhalers as needed.   3. GAD (generalized anxiety disorder) We will increase Buspar to 10 mg today.  - busPIRone (BUSPAR) 10 MG tablet; Take 1 tablet (10 mg total) by mouth 3 (three) times daily.  Dispense: 90 tablet; Refill: 2  4. Nausea and vomiting, intractability of vomiting not specified, unspecified vomiting type Stable today. Rx for Zofran. - ondansetron (ZOFRAN) 4 MG tablet; Take 1 tablet (4 mg total) by mouth every 8 (eight) hours as needed for nausea or vomiting.  Dispense: 20 tablet; Refill: 6  5. Left lower lobe pulmonary nodule We will refer her to Oncology for further evaluation.   6. Follow up She will keep follow up appointment.   Meds ordered this encounter  Medications  . busPIRone (BUSPAR) 10 MG tablet    Sig: Take 1 tablet (10 mg total) by mouth 3 (three) times daily.    Dispense:  90 tablet    Refill:  2  . ondansetron (ZOFRAN) 4 MG tablet    Sig: Take 1 tablet (4 mg total) by mouth every 8 (eight) hours as needed for nausea or vomiting.    Dispense:  20 tablet    Refill:  [place],  MSN, FNP-C [facility],  [place] [address] [phone]

## 2017-08-24 ENCOUNTER — Telehealth: Payer: Self-pay | Admitting: Internal Medicine

## 2017-08-24 ENCOUNTER — Encounter: Payer: Self-pay | Admitting: Internal Medicine

## 2017-08-24 NOTE — Telephone Encounter (Signed)
New referral received from Kathe Becton, FNP [facility] pulmonary nodule. Pt has been scheduled to see Dr. Julien Nordmann on 9/10 at 2:15pm. Left a vm on the Mr. Stege', pt's spouse, with the appt date and time. Letter mailed.

## 2017-08-24 NOTE — Telephone Encounter (Signed)
Pt's husband called and confirmed appt with Dr. Julien Nordmann.

## 2017-08-24 NOTE — Telephone Encounter (Signed)
[name],   Zofran RF was sent yesterday. Please remind patient (as I do before they leave office), that refills will be called in by the next day, (unless the are out of medication, etc), as I have other patients for the remainder of the day and tend to get very busy.   If we all give them the same message, it will cutt down on call backs, AND if they do call, we can give them the same message and won't have to waste time sending in-baskets.   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   Thank you.

## 2017-08-24 NOTE — Addendum Note (Signed)
Addended by: Azzie Glatter on: 08/24/2017 08:41 AM   Modules accepted: Orders

## 2017-08-25 NOTE — Telephone Encounter (Signed)
Patient notified

## 2017-08-25 NOTE — Telephone Encounter (Signed)
-----   Message from [name], [place] sent at 08/24/2017  8:41 AM EDT ----- Regarding: "Referral" Morey Hummingbird,   Please inform patient that I sent in a referral to Oncology (Lung Nodule) and [facility] (Home Oxygen), who will be contacting her soon. If her husband has any additional questions, I will call him back.     Thank you.

## 2017-08-28 ENCOUNTER — Emergency Department (HOSPITAL_COMMUNITY): Payer: Medicaid Other

## 2017-08-28 ENCOUNTER — Emergency Department (HOSPITAL_COMMUNITY)
Admission: EM | Admit: 2017-08-28 | Discharge: 2017-08-28 | Disposition: A | Discharge status: Home / Self Care | Payer: Medicaid Other | Attending: Emergency Medicine | Admitting: Emergency Medicine

## 2017-08-28 ENCOUNTER — Encounter (HOSPITAL_COMMUNITY): Payer: Self-pay

## 2017-08-28 ENCOUNTER — Other Ambulatory Visit: Payer: Self-pay

## 2017-08-28 DIAGNOSIS — K2970 Gastritis, unspecified, without bleeding: Secondary | ICD-10-CM | POA: Diagnosis not present

## 2017-08-28 DIAGNOSIS — F17210 Nicotine dependence, cigarettes, uncomplicated: Secondary | ICD-10-CM | POA: Diagnosis not present

## 2017-08-28 DIAGNOSIS — Z79899 Other long term (current) drug therapy: Secondary | ICD-10-CM | POA: Diagnosis not present

## 2017-08-28 DIAGNOSIS — J449 Chronic obstructive pulmonary disease, unspecified: Secondary | ICD-10-CM | POA: Diagnosis not present

## 2017-08-28 DIAGNOSIS — R1013 Epigastric pain: Secondary | ICD-10-CM | POA: Diagnosis present

## 2017-08-28 LAB — CBC WITH DIFFERENTIAL/PLATELET
BASOS PCT: 0 %
Basophils Absolute: 0 10*3/uL (ref 0.0–0.1)
Eosinophils Absolute: 0 10*3/uL (ref 0.0–0.7)
Eosinophils Relative: 0 %
HEMATOCRIT: 40.8 % (ref 36.0–46.0)
HEMOGLOBIN: 13.7 g/dL (ref 12.0–15.0)
Lymphocytes Relative: 7 %
Lymphs Abs: 0.8 10*3/uL (ref 0.7–4.0)
MCH: 29.5 pg (ref 26.0–34.0)
MCHC: 33.6 g/dL (ref 30.0–36.0)
MCV: 87.7 fL (ref 78.0–100.0)
Monocytes Absolute: 0.6 10*3/uL (ref 0.1–1.0)
Monocytes Relative: 5 %
NEUTROS ABS: 10.7 10*3/uL — AB (ref 1.7–7.7)
NEUTROS PCT: 88 %
Platelets: 321 10*3/uL (ref 150–400)
RBC: 4.65 MIL/uL (ref 3.87–5.11)
RDW: 14.3 % (ref 11.5–15.5)
WBC: 12.1 10*3/uL — AB (ref 4.0–10.5)

## 2017-08-28 LAB — COMPREHENSIVE METABOLIC PANEL
ALT: 11 U/L (ref 0–44)
ANION GAP: 15 (ref 5–15)
AST: 18 U/L (ref 15–41)
Albumin: 4.2 g/dL (ref 3.5–5.0)
Alkaline Phosphatase: 100 U/L (ref 38–126)
BILIRUBIN TOTAL: 0.7 mg/dL (ref 0.3–1.2)
BUN: 23 mg/dL (ref 8–23)
CALCIUM: 9.8 mg/dL (ref 8.9–10.3)
CO2: 25 mmol/L (ref 22–32)
Chloride: 99 mmol/L (ref 98–111)
Creatinine, Ser: 0.9 mg/dL (ref 0.44–1.00)
GFR calc non Af Amer: >60 mL/min (ref >60)
Glucose, Bld: 114 mg/dL — ABNORMAL HIGH (ref 70–99)
POTASSIUM: 3.9 mmol/L (ref 3.5–5.1)
Sodium: 139 mmol/L (ref 135–145)
TOTAL PROTEIN: 7.7 g/dL (ref 6.5–8.1)

## 2017-08-28 LAB — LIPASE, BLOOD: Lipase: 22 U/L (ref 11–51)

## 2017-08-28 MED ORDER — ONDANSETRON 4 MG PO TBDP: 4.0000 mg | ORAL_TABLET | Freq: Three times a day (TID) | ORAL | 0 refills | Status: DC | PRN

## 2017-08-28 MED ORDER — SODIUM CHLORIDE 0.9 % IV BOLUS
1000.0000 mL | Freq: Once | INTRAVENOUS | Status: AC
  Administered 2017-08-28: 1000 mL via INTRAVENOUS

## 2017-08-28 MED ORDER — ONDANSETRON HCL 4 MG/2ML IJ SOLN
4.0000 mg | Freq: Once | INTRAMUSCULAR | Status: AC
  Administered 2017-08-28: 4 mg via INTRAVENOUS
  Filled 2017-08-28: qty 2

## 2017-08-28 MED ORDER — FENTANYL CITRATE (PF) 100 MCG/2ML IJ SOLN
25.0000 ug | Freq: Once | INTRAMUSCULAR | Status: AC
  Administered 2017-08-28: 25 ug via INTRAVENOUS
  Filled 2017-08-28: qty 2

## 2017-08-28 MED ORDER — FAMOTIDINE 20 MG PO TABS: 20.0000 mg | ORAL_TABLET | Freq: Every day | ORAL | 0 refills | Status: AC

## 2017-08-28 NOTE — ED Notes (Signed)
ED Provider at bedside. 

## 2017-08-28 NOTE — ED Notes (Signed)
PT DECLINED X-RAY FOR THE 2ND TIME BECAUSE OF PAIN MEDICATION. EDPA ENCOURAGES PT TO PERFORM X-RAY AND THIS WRITER WILL INFORM EDPA AND LET HER KNOW PT IS REQUESTING PAIN MEDS

## 2017-08-28 NOTE — ED Notes (Signed)
Patient transported to X-ray 

## 2017-08-28 NOTE — ED Provider Notes (Signed)
[facility] DEPT Provider Note   CSN: [id number] Arrival date & time: 08/28/17  1227     History   Chief Complaint Chief Complaint  Patient presents with  . Abdominal Pain  . Nausea  . generalized pain    HPI [name] is a 67 y.o. female with a past medical history of COPD, renal cancer, pancreatitis, polysubstance abuse, who presents to ED for evaluation of 3-week history of epigastric abdominal pain, several episodes of nonbloody, nonbilious emesis, nausea.  She also reports pain going up to her rib cage.  She states that the symptoms have been going on for 3 weeks but have worsened recently.  She is unable to keep anything down for the past week.  She ran out of her home Zofran and has only been taking Tylenol for pain.  She is unsure if this is related to a food that she ate.  No sick contacts with similar symptoms.  She denies any fevers, urinary symptoms, bowel changes, alcohol, tobacco or other drug use.  Prior abdominal surgeries include partial gastrectomy.  HPI  Past Medical History:  Diagnosis Date  . Cancer ([place])    renal ca  . COPD (chronic obstructive pulmonary disease) ([place])   . Drug-seeking behavior   . Pancreatitis   . Pancreatitis   . Seizures ([place])   . Substance abuse [facility])     Patient Active Problem List   Diagnosis Date Noted  . Osteoarthritis of facet joint of lumbar spine 07/26/2017  . Low back pain radiating to both legs 07/26/2017  . 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 Failure to thrive in adult 06/21/2017  . Pressure injury of skin 04/06/2017  . Pressure injury of skin of right ischial tuberosity region: Stage 2 04/06/2017  . Acute urinary retention 04/04/2017  . Benzodiazepine dependence ([place])   . AKI (acute kidney injury) ([place]) 04/03/2017  . Hip fracture, right ([place]) 06/02/2016  . Narcotic addiction ([place]) 02/29/2016  . Abdominal pain, chronic, epigastric- due to chronic  pancreatitis 02/29/2016  . COPD (chronic obstructive pulmonary disease) ([place]) 02/28/2016  . Cough 09/03/2015  . Abdominal pain 01/03/2015  . Hyponatremia 01/03/2015  . Hypocalcemia 01/03/2015  . Underweight 01/03/2015  . COPD GOLD III with reversible component  07/17/2014  . DTs (delirium tremens) ([place]) 06/02/2013  . Chronic alcoholic pancreatitis ([place]) 06/02/2013  . Lactic acidosis 06/02/2013  . Recurrent acute pancreatitis 03/28/2013  . Alcohol withdrawal ([place]) 03/28/2013  . Pancreatitis 03/09/2013  . Tobacco abuse 03/09/2013  . Alcohol withdrawal syndrome with perceptual disturbance ([place]) 03/09/2013  . Acute alcoholic pancreatitis 00/17/4944  . Benzodiazepine withdrawal ([place]) 02/09/2013  . Protein-calorie malnutrition, severe ([place]) 02/09/2013  . Hypokalemia 02/27/2011  . Abdominal pain, acute 02/25/2011  . Nausea and vomiting 02/25/2011  . Thrombocytopenia ([place]) 02/25/2011  . COPD with acute exacerbation ([place]) 02/25/2011  . HTN (hypertension), malignant 02/25/2011  . KIDNEY CANCER 10/18/2008  . Anxiety state 10/18/2008  . GERD 10/18/2008  . PANCREATITIS, CHRONIC- atrophic pancreas 10/18/2008  . DYSPNEA 10/18/2008  . GASTRIC ULCER, HX OF 10/18/2008    Past Surgical History:  Procedure Laterality Date  . IR GENERIC HISTORICAL  05/08/2015   IR RADIOLOGIST EVAL & MGMT 05/08/2015 Aletta Edouard, MD [facility] INTERV RAD  . IR GENERIC HISTORICAL  01/08/2014   IR RADIOLOGIST EVAL & MGMT 01/08/2014 Aletta Edouard, MD [facility] INTERV RAD  . KIDNEY SURGERY     removed cancerous lesions  . PARTIAL GASTRECTOMY  OB History   None      Home Medications    Prior to Admission medications   Medication Sig Start Date End Date Taking? Authorizing Provider  busPIRone (BUSPAR) 10 MG tablet Take 1 tablet (10 mg total) by mouth 3 (three) times daily. 08/23/17   Azzie Glatter, FNP  famotidine (PEPCID) 20 MG tablet Take 1 tablet (20 mg total) by mouth daily. 08/28/17   Charly Hunton, PA-C    hydrOXYzine (ATARAX/VISTARIL) 25 MG tablet Take 1 tablet (25 mg total) by mouth 3 (three) times daily as needed (anxiety). 08/08/17   Azzie Glatter, FNP  mometasone-formoterol (DULERA) 200-5 MCG/ACT AERO Inhale 2 puffs into the lungs 2 (two) times daily. 08/08/17 09/07/17  Azzie Glatter, FNP  ondansetron (ZOFRAN ODT) 4 MG disintegrating tablet Take 1 tablet (4 mg total) by mouth every 8 (eight) hours as needed for nausea or vomiting. 08/28/17   Makhia Vosler, PA-C  ondansetron (ZOFRAN) 4 MG tablet Take 1 tablet (4 mg total) by mouth every 8 (eight) hours as needed for nausea or vomiting. 08/23/17   Azzie Glatter, FNP  PROAIR HFA 108 [phone] Base) MCG/ACT inhaler Inhale 90 Inhalers into the lungs every 4 (four) hours as needed for wheezing or shortness of breath.  08/02/17   [provider]  SUBOXONE 8-2 MG FILM Take 1 Film by mouth daily.  07/30/17   [provider]  tiotropium (SPIRIVA HANDIHALER) 18 MCG inhalation capsule Place 1 capsule (18 mcg total) into inhaler and inhale daily. 08/08/17   Azzie Glatter, FNP    Family History Family History  Problem Relation Age of Onset  . CAD Mother   . Alcoholism Father   . COPD Father     Social History Social History   Tobacco Use  . Smoking status: Current Every Day Smoker    Packs/day: 1.00    Years: 30.00    Pack years: 30.00    Types: Cigarettes  . Smokeless tobacco: Never Used  . Tobacco comment: smoking up to 1.5 ppd per husband.   Substance Use Topics  . Alcohol use: Not Currently    Alcohol/week: 0.0 standard drinks  . Drug use: No    Comment: Hx of polysubstance drug abuse     Allergies   Aspirin and Chlorpromazine hcl   Review of Systems Review of Systems  Constitutional: Negative for appetite change, chills and fever.  HENT: Negative for ear pain, rhinorrhea, sneezing and sore throat.   Eyes: Negative for photophobia and visual disturbance.  Respiratory: Negative for cough, chest tightness,  shortness of breath and wheezing.   Cardiovascular: Negative for chest pain and palpitations.  Gastrointestinal: Positive for abdominal pain, nausea and vomiting. Negative for blood in stool, constipation and diarrhea.  Genitourinary: Negative for dysuria, hematuria and urgency.  Musculoskeletal: Negative for myalgias.  Skin: Negative for rash.  Neurological: Negative for dizziness, weakness and light-headedness.     Physical Exam Updated Vital Signs BP (!) 158/95   Pulse 72   Temp 98.3 F (36.8 C) (Oral)   Resp 18   Ht 5\' 4"  (1.626 m)   Wt 31.7 kg   SpO2 97%   BMI 12.00 kg/m   Physical Exam  Constitutional: She appears well-developed and well-nourished. No distress.  Chronically ill appearing female. Cachectic. Tearful.  HENT:  Head: Normocephalic and atraumatic.  Nose: Nose normal.  Eyes: Conjunctivae and EOM are normal. Left eye exhibits no discharge. No scleral icterus.  Neck: Normal range of motion.  Neck supple.  Cardiovascular: Normal rate, regular rhythm, normal heart sounds and intact distal pulses. Exam reveals no gallop and no friction rub.  No murmur heard. Pulmonary/Chest: Effort normal and breath sounds normal. No respiratory distress.  Abdominal: Soft. Bowel sounds are normal. She exhibits no distension. There is generalized tenderness. There is no rebound and no guarding.  Musculoskeletal: Normal range of motion. She exhibits no edema.  Neurological: She is alert. She exhibits normal muscle tone. Coordination normal.  Skin: Skin is warm and dry. No rash noted.  Psychiatric: She has a normal mood and affect.  Nursing note and vitals reviewed.    ED Treatments / Results  Labs (all labs ordered are listed, but only abnormal results are displayed) Labs Reviewed  COMPREHENSIVE METABOLIC PANEL - Abnormal; Notable for the following components:      Result Value   Glucose, Bld 114 (*)    All other components within normal limits  CBC WITH  DIFFERENTIAL/PLATELET - Abnormal; Notable for the following components:   WBC 12.1 (*)    Neutro Abs 10.7 (*)    All other components within normal limits  LIPASE, BLOOD    EKG None  Radiology Dg Abdomen Acute W/chest  Result Date: 08/28/2017 CLINICAL DATA:  Epigastric pain. EXAM: DG ABDOMEN ACUTE W/ 1V CHEST COMPARISON:  CT of the abdomen and pelvis 08/22/2017 FINDINGS: The heart size is normal. Atherosclerotic calcifications are present at the aortic arch. Emphysematous changes are noted. The left paraspinous 2.5 cm mass is again noted. Bowel gas pattern is normal. There is no free air. No obstruction is present. Postsurgical changes are present at both hips. IMPRESSION: 1. No acute cardiopulmonary disease. 2. Left paraspinous mass. 3. Normal bowel gas pattern. No acute or focal lesion to explain epigastric pain. Electronically Signed   By: San Morelle M.D.   On: 08/28/2017 15:37    Procedures Procedures (including critical care time)  Medications Ordered in ED Medications  sodium chloride 0.9 % bolus 1,000 mL (1,000 mLs Intravenous New Bag/Given 08/28/17 1331)  fentaNYL (SUBLIMAZE) injection 25 mcg (25 mcg Intravenous Given 08/28/17 1332)  ondansetron (ZOFRAN) injection 4 mg (4 mg Intravenous Given 08/28/17 1542)  fentaNYL (SUBLIMAZE) injection 25 mcg (25 mcg Intravenous Given 08/28/17 1542)     Initial Impression / Assessment and Plan / ED Course  I have reviewed the triage vital signs and the nursing notes.  Pertinent labs & imaging results that were available during my care of the patient were reviewed by me and considered in my medical decision making (see chart for details).     67 year old female with past medical history of COPD, renal cancer, pancreatitis, polysubstance abuse who presents to ED for evaluation of 3-week history of epigastric abdominal pain, several episodes of nonbloody, nonbilious emesis and nausea.  Symptoms have been going on for 3 weeks but have  worsened recently.  She ran out of her home Zofran and has been taking Tylenol for pain.  Patient has been seen and evaluated here multiple times for similar complaints.  Her vital signs are stable.  She has tenderness palpation of the epigastric area with no rebound or guarding noted.  She denies any urinary symptoms or bowel changes.  CBC shows leukocytosis at 12.1.  CMP, lipase unremarkable.  Suspect that leukocytosis could be due to viral etiology.  Abdominal x-ray with no acute abnormalities.  Patient had a CT scan done less than 1 week ago which showed no acute abnormalities.  She does have a lung  mass which she has not yet followed up about.  Patient was given fluids, pain medication and Zofran with significant improvement in her symptoms.  She was able to tolerate p.o. intake without difficulty prior to discharge.  Will advise her to follow-up with PCP for further evaluation.  At this time I doubt surgical or emergent cause of symptoms.  Will advise her to return to ED for any severe worsening symptoms.  Portions of this note were generated with [employer]. Dictation errors may occur despite best attempts at proofreading.   Final Clinical Impressions(s) / ED Diagnoses   Final diagnoses:  Gastritis without bleeding, unspecified chronicity, unspecified gastritis type    ED Discharge Orders         Ordered    ondansetron (ZOFRAN ODT) 4 MG disintegrating tablet  Every 8 hours PRN     08/28/17 1618    famotidine (PEPCID) 20 MG tablet  Daily     08/28/17 1618           Delia Heady, PA-C 08/28/17 1622    Noemi Chapel, MD 08/30/17 1040

## 2017-08-28 NOTE — ED Notes (Signed)
PT STATES SHE DID NOT TAKE HER BP MEDICATIONS THIS AM BECAUSE OF HER NAUSEA.

## 2017-08-28 NOTE — ED Notes (Signed)
COKE GIVEN

## 2017-08-28 NOTE — ED Notes (Signed)
Bed: [facility] Expected date:  Expected time:  Means of arrival:  Comments: 

## 2017-08-28 NOTE — ED Notes (Signed)
ED Provider at bedside. HINA

## 2017-08-28 NOTE — Discharge Instructions (Signed)
Return to ED for worsening symptoms, severe abdominal pain or chest pain, vomiting or coughing up blood, trouble breathing or trouble swallowing.

## 2017-08-28 NOTE — ED Notes (Signed)
HUSBAND MICHAEL CALLED - REQUEST PT BE OUT FRONT WAITING

## 2017-08-28 NOTE — ED Triage Notes (Signed)
Per [facility]- Pt resides at home. Pt c/o chronic  generalized pain and GI pain. Pt requesting oxygen. Placed by fire at [facility]. Nausea and side pain. Pt states she is not on any pain medications. Denies vomiting.

## 2017-08-30 ENCOUNTER — Emergency Department (HOSPITAL_COMMUNITY): Payer: Medicaid Other

## 2017-08-30 ENCOUNTER — Encounter (HOSPITAL_COMMUNITY): Payer: Self-pay

## 2017-08-30 ENCOUNTER — Emergency Department (HOSPITAL_COMMUNITY)
Admission: EM | Admit: 2017-08-30 | Discharge: 2017-08-30 | Disposition: A | Discharge status: Home / Self Care | Payer: Medicaid Other | Attending: Emergency Medicine | Admitting: Emergency Medicine

## 2017-08-30 DIAGNOSIS — K5900 Constipation, unspecified: Secondary | ICD-10-CM | POA: Insufficient documentation

## 2017-08-30 DIAGNOSIS — R112 Nausea with vomiting, unspecified: Secondary | ICD-10-CM | POA: Insufficient documentation

## 2017-08-30 DIAGNOSIS — R062 Wheezing: Secondary | ICD-10-CM | POA: Insufficient documentation

## 2017-08-30 DIAGNOSIS — I10 Essential (primary) hypertension: Secondary | ICD-10-CM | POA: Insufficient documentation

## 2017-08-30 DIAGNOSIS — G8929 Other chronic pain: Secondary | ICD-10-CM | POA: Diagnosis not present

## 2017-08-30 DIAGNOSIS — Z79899 Other long term (current) drug therapy: Secondary | ICD-10-CM | POA: Diagnosis not present

## 2017-08-30 DIAGNOSIS — R1013 Epigastric pain: Secondary | ICD-10-CM | POA: Insufficient documentation

## 2017-08-30 DIAGNOSIS — R3129 Other microscopic hematuria: Secondary | ICD-10-CM | POA: Insufficient documentation

## 2017-08-30 DIAGNOSIS — J449 Chronic obstructive pulmonary disease, unspecified: Secondary | ICD-10-CM | POA: Insufficient documentation

## 2017-08-30 DIAGNOSIS — F17210 Nicotine dependence, cigarettes, uncomplicated: Secondary | ICD-10-CM | POA: Insufficient documentation

## 2017-08-30 DIAGNOSIS — R509 Fever, unspecified: Secondary | ICD-10-CM | POA: Diagnosis present

## 2017-08-30 LAB — COMPREHENSIVE METABOLIC PANEL
ALT: 12 U/L (ref 0–44)
ANION GAP: 12 (ref 5–15)
AST: 21 U/L (ref 15–41)
Albumin: 4 g/dL (ref 3.5–5.0)
Alkaline Phosphatase: 95 U/L (ref 38–126)
BILIRUBIN TOTAL: 0.4 mg/dL (ref 0.3–1.2)
BUN: 16 mg/dL (ref 8–23)
CO2: 26 mmol/L (ref 22–32)
Calcium: 9.5 mg/dL (ref 8.9–10.3)
Chloride: 101 mmol/L (ref 98–111)
Creatinine, Ser: 0.99 mg/dL (ref 0.44–1.00)
GFR calc Af Amer: >60 mL/min (ref >60)
GFR, EST NON AFRICAN AMERICAN: 59 mL/min — AB (ref >60)
Glucose, Bld: 187 mg/dL — ABNORMAL HIGH (ref 70–99)
Potassium: 3.5 mmol/L (ref 3.5–5.1)
Sodium: 139 mmol/L (ref 135–145)
TOTAL PROTEIN: 7.1 g/dL (ref 6.5–8.1)

## 2017-08-30 LAB — CBC WITH DIFFERENTIAL/PLATELET
BASOS ABS: 0 10*3/uL (ref 0.0–0.1)
Basophils Relative: 0 %
EOS PCT: 1 %
Eosinophils Absolute: 0.1 10*3/uL (ref 0.0–0.7)
HEMATOCRIT: 42 % (ref 36.0–46.0)
Hemoglobin: 14.2 g/dL (ref 12.0–15.0)
Lymphocytes Relative: 18 %
Lymphs Abs: 1.3 10*3/uL (ref 0.7–4.0)
MCH: 29.3 pg (ref 26.0–34.0)
MCHC: 33.8 g/dL (ref 30.0–36.0)
MCV: 86.6 fL (ref 78.0–100.0)
Monocytes Absolute: 0.6 10*3/uL (ref 0.1–1.0)
Monocytes Relative: 9 %
NEUTROS ABS: 5.2 10*3/uL (ref 1.7–7.7)
NEUTROS PCT: 72 %
Platelets: 288 10*3/uL (ref 150–400)
RBC: 4.85 MIL/uL (ref 3.87–5.11)
RDW: 14.2 % (ref 11.5–15.5)
WBC: 7.2 10*3/uL (ref 4.0–10.5)

## 2017-08-30 LAB — URINALYSIS, ROUTINE W REFLEX MICROSCOPIC
BACTERIA UA: NONE SEEN
GLUCOSE, UA: NEGATIVE mg/dL
Ketones, ur: 20 mg/dL — AB
LEUKOCYTES UA: NEGATIVE
NITRITE: NEGATIVE
Protein, ur: 100 mg/dL — AB
Specific Gravity, Urine: 1.023 (ref 1.005–1.030)
pH: 5 (ref 5.0–8.0)

## 2017-08-30 LAB — I-STAT CG4 LACTIC ACID, ED: Lactic Acid, Venous: 1.19 mmol/L (ref 0.5–1.9)

## 2017-08-30 LAB — RAPID URINE DRUG SCREEN, HOSP PERFORMED
Amphetamines: NOT DETECTED
Barbiturates: NOT DETECTED
Benzodiazepines: NOT DETECTED
Cocaine: NOT DETECTED
Opiates: POSITIVE — AB
TETRAHYDROCANNABINOL: NOT DETECTED

## 2017-08-30 MED ORDER — HALOPERIDOL LACTATE 5 MG/ML IJ SOLN
2.0000 mg | Freq: Once | INTRAMUSCULAR | Status: DC
  Filled 2017-08-30: qty 1

## 2017-08-30 MED ORDER — LORAZEPAM 2 MG/ML IJ SOLN
1.0000 mg | Freq: Once | INTRAMUSCULAR | Status: AC
  Administered 2017-08-30: 1 mg via INTRAVENOUS
  Filled 2017-08-30: qty 1

## 2017-08-30 MED ORDER — IPRATROPIUM-ALBUTEROL 0.5-2.5 (3) MG/3ML IN SOLN
3.0000 mL | Freq: Once | RESPIRATORY_TRACT | Status: AC
  Administered 2017-08-30: 3 mL via RESPIRATORY_TRACT
  Filled 2017-08-30: qty 3

## 2017-08-30 MED ORDER — ONDANSETRON HCL 4 MG/2ML IJ SOLN: 4.0000 mg | Freq: Once | INTRAMUSCULAR | Status: DC

## 2017-08-30 MED ORDER — POLYETHYLENE GLYCOL 3350 17 GM/SCOOP PO POWD: ORAL | 0 refills | Status: DC

## 2017-08-30 MED ORDER — SODIUM CHLORIDE 0.9 % IV BOLUS
1000.0000 mL | Freq: Once | INTRAVENOUS | Status: AC
  Administered 2017-08-30: 1000 mL via INTRAVENOUS

## 2017-08-30 NOTE — ED Notes (Signed)
Patient transported to CT 

## 2017-08-30 NOTE — ED Notes (Signed)
Bed: TX77 Expected date:  Expected time:  Means of arrival:  Comments: EMS-UTI/weak

## 2017-08-30 NOTE — ED Provider Notes (Signed)
[place] DEPT Provider Note   CSN: [id number] Arrival date & time: 08/30/17  1614     History   Chief Complaint Chief Complaint  Patient presents with  . Fever  . Urinary Tract Infection    HPI [name] is a 67 y.o. female.  67 year old female with extensive past medical history including renal cancer, pancreatitis, chronic abdominal pain, COPD, GERD, substance abuse who presents with concern about UTI.  Recently the patient has had brown-colored urine and states that it has a foul odor.  She has had chronic medical issues for the past 4 months including chronic epigastric pain and nausea/vomiting.  She has had decreased appetite over the past month.  She states she has not been eating much except for drinking Coke.  She reports a fever of 102 today, was given Tylenol by EMS prior to arrival.  She reports cough that started this morning.  She states that her breathing is at baseline.  She uses breathing treatments regularly.  She reports pain in both legs.  The history is provided by the patient.  Fever    Urinary Tract Infection      Past Medical History:  Diagnosis Date  . Cancer ([place])    renal ca  . COPD (chronic obstructive pulmonary disease) ([place])   . Drug-seeking behavior   . Pancreatitis   . Pancreatitis   . Seizures ([place])   . Substance abuse [facility])     Patient Active Problem List   Diagnosis Date Noted  . Osteoarthritis of facet joint of lumbar spine 07/26/2017  . Low back pain radiating to both legs 07/26/2017  . 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 Failure to thrive in adult 06/21/2017  . Pressure injury of skin 04/06/2017  . Pressure injury of skin of right ischial tuberosity region: Stage 2 04/06/2017  . Acute urinary retention 04/04/2017  . Benzodiazepine dependence ([place])   . AKI (acute kidney injury) ([place]) 04/03/2017  . Hip fracture, right ([place]) 06/02/2016  . Narcotic addiction  ([place]) 02/29/2016  . Abdominal pain, chronic, epigastric- due to chronic pancreatitis 02/29/2016  . COPD (chronic obstructive pulmonary disease) ([place]) 02/28/2016  . Cough 09/03/2015  . Abdominal pain 01/03/2015  . Hyponatremia 01/03/2015  . Hypocalcemia 01/03/2015  . Underweight 01/03/2015  . COPD GOLD III with reversible component  07/17/2014  . DTs (delirium tremens) ([place]) 06/02/2013  . Chronic alcoholic pancreatitis ([place]) 06/02/2013  . Lactic acidosis 06/02/2013  . Recurrent acute pancreatitis 03/28/2013  . Alcohol withdrawal ([place]) 03/28/2013  . Pancreatitis 03/09/2013  . Tobacco abuse 03/09/2013  . Alcohol withdrawal syndrome with perceptual disturbance ([place]) 03/09/2013  . Acute alcoholic pancreatitis 23/55/7322  . Benzodiazepine withdrawal ([place]) 02/09/2013  . Protein-calorie malnutrition, severe ([place]) 02/09/2013  . Hypokalemia 02/27/2011  . Abdominal pain, acute 02/25/2011  . Nausea and vomiting 02/25/2011  . Thrombocytopenia ([place]) 02/25/2011  . COPD with acute exacerbation ([place]) 02/25/2011  . HTN (hypertension), malignant 02/25/2011  . KIDNEY CANCER 10/18/2008  . Anxiety state 10/18/2008  . GERD 10/18/2008  . PANCREATITIS, CHRONIC- atrophic pancreas 10/18/2008  . DYSPNEA 10/18/2008  . GASTRIC ULCER, HX OF 10/18/2008    Past Surgical History:  Procedure Laterality Date  . IR GENERIC HISTORICAL  05/08/2015   IR RADIOLOGIST EVAL & MGMT 05/08/2015 Aletta Edouard, MD [facility] INTERV RAD  . IR GENERIC HISTORICAL  01/08/2014   IR RADIOLOGIST EVAL & MGMT 01/08/2014 Aletta Edouard, MD [facility] INTERV RAD  . KIDNEY SURGERY  removed cancerous lesions  . PARTIAL GASTRECTOMY       OB History   None      Home Medications    Prior to Admission medications   Medication Sig Start Date End Date Taking? Authorizing Provider  busPIRone (BUSPAR) 10 MG tablet Take 1 tablet (10 mg total) by mouth 3 (three) times daily. 08/23/17  Yes Azzie Glatter, FNP  famotidine (PEPCID) 20 MG tablet  Take 1 tablet (20 mg total) by mouth daily. 08/28/17  Yes Khatri, Hina, PA-C  hydrOXYzine (ATARAX/VISTARIL) 25 MG tablet Take 1 tablet (25 mg total) by mouth 3 (three) times daily as needed (anxiety). 08/08/17  Yes Azzie Glatter, FNP  mometasone-formoterol (DULERA) 200-5 MCG/ACT AERO Inhale 2 puffs into the lungs 2 (two) times daily. 08/08/17 09/07/17 Yes Azzie Glatter, FNP  ondansetron (ZOFRAN) 4 MG tablet Take 1 tablet (4 mg total) by mouth every 8 (eight) hours as needed for nausea or vomiting. 08/23/17  Yes Azzie Glatter, FNP  PROAIR HFA 108 [phone] Base) MCG/ACT inhaler Inhale 90 Inhalers into the lungs every 4 (four) hours as needed for wheezing or shortness of breath.  08/02/17  Yes [provider]  SUBOXONE 8-2 MG FILM Take 1 Film by mouth daily.  07/30/17  Yes [provider]  tiotropium (SPIRIVA HANDIHALER) 18 MCG inhalation capsule Place 1 capsule (18 mcg total) into inhaler and inhale daily. 08/08/17  Yes Azzie Glatter, FNP  ondansetron (ZOFRAN ODT) 4 MG disintegrating tablet Take 1 tablet (4 mg total) by mouth every 8 (eight) hours as needed for nausea or vomiting. Patient not taking: Reported on 08/30/2017 08/28/17   Delia Heady, PA-C  polyethylene glycol powder (GLYCOLAX/MIRALAX) powder Mix 1 capful of powder into drink and take by mouth 1-3 times daily until daily soft stools OTC 08/30/17   Cassity Christian, Wenda Overland, MD    Family History Family History  Problem Relation Age of Onset  . CAD Mother   . Alcoholism Father   . COPD Father     Social History Social History   Tobacco Use  . Smoking status: Current Every Day Smoker    Packs/day: 1.00    Years: 30.00    Pack years: 30.00    Types: Cigarettes  . Smokeless tobacco: Never Used  . Tobacco comment: smoking up to 1.5 ppd per husband.   Substance Use Topics  . Alcohol use: Not Currently    Alcohol/week: 0.0 standard drinks  . Drug use: No    Comment: Hx of polysubstance drug abuse     Allergies     Aspirin and Chlorpromazine hcl   Review of Systems Review of Systems  Constitutional: Positive for fever.   All other systems reviewed and are negative except that which was mentioned in HPI   Physical Exam Updated Vital Signs BP 120/86   Pulse 70   Temp 99.3 F (37.4 C) (Oral)   Resp 19   SpO2 94%   Physical Exam  Constitutional: She is oriented to person, place, and time. She appears well-developed and well-nourished.  Cachectic, chronically ill-appearing, anxious  HENT:  Head: Normocephalic and atraumatic.  Moist mucous membranes  Eyes: Conjunctivae are normal.  Neck: Neck supple.  Cardiovascular: Normal rate, regular rhythm and normal heart sounds.  No murmur heard. Pulmonary/Chest: Effort normal. No respiratory distress. She has wheezes (end-expiratory).  Abdominal: Soft. Bowel sounds are normal. She exhibits no distension. There is no tenderness.  Musculoskeletal: She exhibits no edema.  Diffuse muscle atrophy  Neurological: She is alert and oriented to person, place, and time.  Fluent speech  Skin: Skin is warm and dry.  Psychiatric: Her mood appears anxious.  Nursing note and vitals reviewed.    ED Treatments / Results  Labs (all labs ordered are listed, but only abnormal results are displayed) Labs Reviewed  COMPREHENSIVE METABOLIC PANEL - Abnormal; Notable for the following components:      Result Value   Glucose, Bld 187 (*)    GFR calc non Af Amer 59 (*)    All other components within normal limits  URINALYSIS, ROUTINE W REFLEX MICROSCOPIC - Abnormal; Notable for the following components:   Color, Urine AMBER (*)    Hgb urine dipstick LARGE (*)    Bilirubin Urine SMALL (*)    Ketones, ur 20 (*)    Protein, ur 100 (*)    All other components within normal limits  RAPID URINE DRUG SCREEN, HOSP PERFORMED - Abnormal; Notable for the following components:   Opiates POSITIVE (*)    All other components within normal limits  URINE CULTURE  CULTURE,  BLOOD (ROUTINE X 2)  CULTURE, BLOOD (ROUTINE X 2)  CBC WITH DIFFERENTIAL/PLATELET  I-STAT CG4 LACTIC ACID, ED    EKG None  Radiology Dg Chest 2 View  Result Date: 08/30/2017 CLINICAL DATA:  Cough and COPD EXAM: CHEST - 2 VIEW COMPARISON:  08/28/2017 FINDINGS: The lungs are hyperinflated with diffuse interstitial prominence. No focal airspace consolidation or pulmonary edema. No pleural effusion or pneumothorax. Normal cardiomediastinal contours. Unchanged appearance of left paraspinous mass. IMPRESSION: 1. COPD without focal airspace disease. 2. Unchanged appearance of left paramedian mass. Electronically Signed   By: Ulyses Jarred M.D.   On: 08/30/2017 18:53   Ct Renal Stone Study  Result Date: 08/30/2017 CLINICAL DATA:  Recurring urinary tract infection. History of substance abuse, pancreatitis, renal cell carcinoma. EXAM: CT ABDOMEN AND PELVIS WITHOUT CONTRAST TECHNIQUE: Multidetector CT imaging of the abdomen and pelvis was performed following the standard protocol without IV contrast. COMPARISON:  CT abdomen and pelvis August 22, 2017 FINDINGS: Moderate respiratory motion degraded examination. LOWER CHEST: Lung bases are clear. The visualized heart size is normal. Small pericardial effusion. HEPATOBILIARY: Negative liver. Similar mild biliary dilatation better demonstrated on recent contrast-enhanced CT. No gallbladder distension or definite cholelithiasis though limited by motion. PANCREAS: Normal. SPLEEN: Punctate splenic granuloma. ADRENALS/URINARY TRACT: Kidneys are orthotopic, demonstrating normal size and morphology. Partially calcified 13 mm LEFT lower pole mass at site of prior ablation. No nephrolithiasis, hydronephrosis; limited assessment for renal masses by nonenhanced CT. The unopacified ureters are normal in course and caliber. Urinary bladder is partially distended and unremarkable. Normal adrenal glands. STOMACH/BOWEL: The stomach, small and large bowel are normal in course and  caliber without inflammatory changes, sensitivity decreased by lack of enteric contrast. Surgical suture material at gastric antrum. Mild stool distended rectum with moderate amount of retained large bowel stool. Normal appendix. VASCULAR/LYMPHATIC: Aortoiliac vessels are normal in course and caliber. Severe calcific atherosclerosis. No lymphadenopathy by CT size criteria. REPRODUCTIVE: Status post hysterectomy. OTHER: No intraperitoneal free fluid or free air. MUSCULOSKELETAL: Non-acute. Streak artifact from bilateral femoral neck pinning. Cachexia. Osteopenia. Grade 1 L2-3 retrolisthesis. IMPRESSION: 1. Moderately motion degraded examination. 2. No urolithiasis or obstructive uropathy. Post treatment changes LEFT kidney. 3. Moderate amount of retained large bowel stool without bowel obstruction. Aortic Atherosclerosis (ICD10-I70.0). Electronically Signed   By: Elon Alas M.D.   On: 08/30/2017 19:08    Procedures Procedures (including critical care  time)  Medications Ordered in ED Medications  sodium chloride 0.9 % bolus 1,000 mL (0 mLs Intravenous Stopped 08/30/17 1844)  LORazepam (ATIVAN) injection 1 mg (1 mg Intravenous Given 08/30/17 1746)  ipratropium-albuterol (DUONEB) 0.5-2.5 (3) MG/3ML nebulizer solution 3 mL (3 mLs Nebulization Given 08/30/17 1805)     Initial Impression / Assessment and Plan / ED Course  I have reviewed the triage vital signs and the nursing notes.  Pertinent labs & imaging results that were available during my care of the patient were reviewed by me and considered in my medical decision making (see chart for details).     Chronically ill appearing on exam, T 99.3, HR 105, O2 sat normal on RA. End-expiratory wheezes, no respiratory distress. Initial lactate normal, CBC normal, CMP reassuring with normal creatinine. CXR negative acute.  UA contained a small amount of blood, no evidence of infection.  Because of hematuria and complaints of dark urine, obtained renal  stone study to evaluate for obstructive stone.  CT showed no stones and no other acute pathology aside from constipation.  The patient told me that she no longer is prescribed opiate medications but I am concerned about the fact that her UDS was positive for opiates.  Patient resting comfortably on reassessment with normal vital signs.  She has no fevers here and has no signs/sx of infection. I discussed constipation treatment w/ miralax and instructed to f/u with PCP for reassessment. Reviewed return precautions.   Final Clinical Impressions(s) / ED Diagnoses   Final diagnoses:  Other microscopic hematuria  Constipation, unspecified constipation type  Other chronic pain    ED Discharge Orders         Ordered    polyethylene glycol powder (GLYCOLAX/MIRALAX) powder     08/30/17 2215           Maddilynn Esperanza, Wenda Overland, MD 08/30/17 2236

## 2017-08-30 NOTE — ED Triage Notes (Signed)
Patient was here at [facility] Sunday. When patient got home she started feeling like she was getting a UTI. Temp-102 per EMS. Urine has foul odor and brown. Husband is concerned. Patient has chronic issues x4 months. Patient has not ate much in the past month except for drinking coke. Patient has 02 at home PRN. Patient asked ems to be put on 02. 1000mg  tylenol given per EMS.

## 2017-08-31 LAB — URINE CULTURE
CULTURE: NO GROWTH
Special Requests: NORMAL

## 2017-09-01 ENCOUNTER — Encounter (INDEPENDENT_AMBULATORY_CARE_PROVIDER_SITE_OTHER): Payer: Medicaid Other | Admitting: Physical Medicine and Rehabilitation

## 2017-09-02 ENCOUNTER — Ambulatory Visit (HOSPITAL_COMMUNITY)
Admission: RE | Admit: 2017-09-02 | Discharge: 2017-09-02 | Disposition: A | Discharge status: Home / Self Care | Payer: Medicaid Other | Attending: Psychiatry | Admitting: Psychiatry

## 2017-09-02 DIAGNOSIS — Z811 Family history of alcohol abuse and dependence: Secondary | ICD-10-CM | POA: Diagnosis not present

## 2017-09-02 DIAGNOSIS — Z888 Allergy status to other drugs, medicaments and biological substances status: Secondary | ICD-10-CM | POA: Diagnosis not present

## 2017-09-02 DIAGNOSIS — Z79899 Other long term (current) drug therapy: Secondary | ICD-10-CM | POA: Diagnosis not present

## 2017-09-02 DIAGNOSIS — Z886 Allergy status to analgesic agent status: Secondary | ICD-10-CM | POA: Insufficient documentation

## 2017-09-02 DIAGNOSIS — F17210 Nicotine dependence, cigarettes, uncomplicated: Secondary | ICD-10-CM | POA: Diagnosis not present

## 2017-09-02 DIAGNOSIS — F411 Generalized anxiety disorder: Secondary | ICD-10-CM | POA: Insufficient documentation

## 2017-09-02 DIAGNOSIS — Z8249 Family history of ischemic heart disease and other diseases of the circulatory system: Secondary | ICD-10-CM | POA: Insufficient documentation

## 2017-09-02 DIAGNOSIS — Z915 Personal history of self-harm: Secondary | ICD-10-CM | POA: Diagnosis not present

## 2017-09-02 DIAGNOSIS — Z825 Family history of asthma and other chronic lower respiratory diseases: Secondary | ICD-10-CM | POA: Diagnosis not present

## 2017-09-02 NOTE — BH Assessment (Addendum)
Assessment Note  [name] is an 67 y.o. female who presented at [facility] as a walk-in seeking help for her anxiety and pain management.  Patient states that her anxiety is so bad that she is not able to breathe, her body feels like it is drawing up, her mind feels "cloudy," she is nauseated and she feels like she is choking. Patient states that she is unable to get any relief fron her nerves.  Patient states that she has one prior suicide attempt as an adolescent.  She states that she has two prior psych inpatient admissions and her last one was at [facility], but she could not recall when she was admitted.  Patient states that she is not suicidal, homicidal or currently psychotic, but she states that eight weeks ago that she was hearing the voices of her deceased mother and brother. Patient states that she is only sleeping 2-4 hours at night, she is not eating and states that she has lost fifteen pounds in the past month. She denies any drug abuse, however, until a couple months ago, patient was taking valium and dilaudid for her anxiety and pain.  Patient states that her MD lost his license to practice and she is now being treated for her anxiety at [facility] and states that she is going to a Pain Management Center.  Patient states that she is now taking Suboxone and Buspar, but states that these medications are not working.  She states that she has been to [facility] in the past. Patient denies a  history of abuse or self-mutilation.  She denies a family history of addiction or mental health issues.  Patient presented as alert and oriented.  Her memory was intact.  Her judgment and insight are limited and she is impulsive.  Patient id depressed, highly anxious and restless.  Patient does not appear to be responding to internal stimuli.  Patient was cooperative during the interview process.    Diagnosis: F41.1 GAD  Past Medical History:   Past Surgical History:  Procedure Laterality Date  . IR GENERIC  HISTORICAL  05/08/2015   IR RADIOLOGIST EVAL & MGMT 05/08/2015 Aletta Edouard, MD [facility] INTERV RAD  . IR GENERIC HISTORICAL  01/08/2014   IR RADIOLOGIST EVAL & MGMT 01/08/2014 Aletta Edouard, MD [facility] INTERV RAD  . KIDNEY SURGERY     removed cancerous lesions  . PARTIAL GASTRECTOMY      Family History:  Family History  Problem Relation Age of Onset  . CAD Mother   . Alcoholism Father   . COPD Father     Social History:  reports that she has been smoking cigarettes. She has a 30.00 pack-year smoking history. She has never used smokeless tobacco. She reports that she drank alcohol. She reports that she does not use drugs.  Additional Social History:  Alcohol / Drug Use Pain Medications: denies Prescriptions: denies Over the Counter: denies History of alcohol / drug use?: No history of alcohol / drug abuse  CIWA: CIWA-Ar BP: (!) 154/87 Pulse Rate: 81 COWS:    Allergies:  Allergies  Allergen Reactions  . Aspirin Other (See Comments)    Break out in welts  . Chlorpromazine Hcl Other (See Comments)    Muscle spasms    Home Medications:  (Not in a hospital admission)  OB/GYN Status:  No LMP recorded. Patient has had a hysterectomy.  General Assessment Data Location of Assessment: [facility] Assessment Services TTS Assessment: In system Is this an Initial Assessment or a  Re-assessment for this encounter?: Initial Assessment Patient Accompanied by:: Other(husband) Language Other than English: No Living Arrangements: Other (Comment)(with husband) What gender do you identify as?: Female Marital status: Married Living Arrangements: Spouse/significant other Can pt return to current living arrangement?: Yes Admission Status: Voluntary Is patient capable of signing voluntary admission?: Yes Referral Source: Self/Family/Friend  Medical Screening Exam ([place]) Medical Exam completed: Yes  Crisis Care Plan Living Arrangements: Spouse/significant other Legal Guardian:  Other: Name of Psychiatrist: self Name of Therapist: ([facility])  Education Status Is patient currently in school?: No Is the patient employed, unemployed or receiving disability?: Receiving disability income  Risk to self with the past 6 months Suicidal Ideation: No Has patient been a risk to self within the past 6 months prior to admission? : No Suicidal Intent: No Has patient had any suicidal intent within the past 6 months prior to admission? : No Is patient at risk for suicide?: No Suicidal Plan?: No Has patient had any suicidal plan within the past 6 months prior to admission? : No Access to Means: No What has been your use of drugs/alcohol within the last 12 months?: (none) Previous Attempts/Gestures: Yes How many times?: 1(cut her wrist as a teenager) Other Self Harm Risks: (chronic pain and anxiety) Triggers for Past Attempts: Unknown Intentional Self Injurious Behavior: None Family Suicide History: No Recent stressful life event(s): (chronic pain issues) Persecutory voices/beliefs?: No Depression: Yes Depression Symptoms: Despondent, Tearfulness, Isolating, Fatigue, Loss of interest in usual pleasures Substance abuse history and/or treatment for substance abuse?: No Suicide prevention information given to non-admitted patients: Not applicable  Risk to Others within the past 6 months Homicidal Ideation: No Does patient have any lifetime risk of violence toward others beyond the six months prior to admission? : No Thoughts of Harm to Others: No Current Homicidal Intent: No Current Homicidal Plan: No Access to Homicidal Means: No Identified Victim: none History of harm to others?: No Assessment of Violence: None Noted Violent Behavior Description: none Does patient have access to weapons?: No Criminal Charges Pending?: No Does patient have a court date: No Is patient on probation?: No  Psychosis Hallucinations: Auditory(hears her dead brother and  mother) Delusions: None noted  Mental Status Report Appearance/Hygiene: Other (Comment)(came in one piece pajamas) Eye Contact: Good Motor Activity: Restlessness Speech: Pressured Level of Consciousness: Alert Mood: Depressed, Anxious Affect: Anxious, Depressed Anxiety Level: Panic Attacks Panic attack frequency: (daily) Most recent panic attack: (today) Thought Processes: Coherent, Relevant Judgement: Impaired Orientation: Person, Place, Time, Situation Obsessive Compulsive Thoughts/Behaviors: None  Cognitive Functioning Concentration: Decreased Memory: Recent Intact, Remote Intact Is patient IDD: No Insight: Poor Impulse Control: Poor Appetite: Poor Have you had any weight changes? : Loss Amount of the weight change? (lbs): (15) Sleep: Decreased Total Hours of Sleep: 3 Vegetative Symptoms: Decreased grooming  ADLScreening [facility] Assessment Services) Patient's cognitive ability adequate to safely complete daily activities?: Yes Patient able to express need for assistance with ADLs?: Yes Independently performs ADLs?: Yes (appropriate for developmental age)  Prior Inpatient Therapy Prior Inpatient Therapy: Yes Prior Therapy Dates: unknown Prior Therapy Facilty/Provider(s): [facility]) Reason for Treatment: (anxiety and depression)  Prior Outpatient Therapy Prior Outpatient Therapy: Yes Prior Therapy Dates: (active) Prior Therapy Facilty/Provider(s): [facility]) Reason for Treatment: (depression) Does patient have an ACCT team?: No Does patient have [facility] services? : No Does patient have P4CC services?: No  ADL Screening (condition at time of admission) Patient's cognitive ability adequate to safely complete daily activities?: Yes Is the patient deaf  or have difficulty hearing?: No Does the patient have difficulty seeing, even when wearing glasses/contacts?: No Does the patient have difficulty concentrating, remembering, or making decisions?: No Patient able to express need  for assistance with ADLs?: Yes Does the patient have difficulty dressing or bathing?: No Independently performs ADLs?: Yes (appropriate for developmental age) Does the patient have difficulty walking or climbing stairs?: Yes Weakness of Legs: Both Weakness of Arms/Hands: Both     Therapy Consults (therapy consults require a physician order) PT Evaluation Needed: No OT Evalulation Needed: No SLP Evaluation Needed: No Abuse/Neglect Assessment (Assessment to be complete while patient is alone) Abuse/Neglect Assessment Can Be Completed: Yes Physical Abuse: Denies Verbal Abuse: Denies Sexual Abuse: Denies Exploitation of patient/patient's resources: Denies Self-Neglect: Denies Values / Beliefs Cultural Requests During Hospitalization: None Spiritual Requests During Hospitalization: None Consults Spiritual Care Consult Needed: No Social Work Consult Needed: No Regulatory affairs officer (For Healthcare) Does Patient Have a Medical Advance Directive?: No Would patient like information on creating a medical advance directive?: No - Patient declined Nutrition Screen- [facility] Adult/[facility]/AP Has the patient recently lost weight without trying?: Yes, 14-23 lbs. Has the patient been eating poorly because of a decreased appetite?: Yes Malnutrition Screening Tool Score: 3        Disposition: Per Marvia Pickles, NP, patient does not meet admission criteria and patient is requesting information on pain management centers. Disposition Initial Assessment Completed for this Encounter: Yes Disposition of Patient: Discharge Patient refused recommended treatment: No Mode of transportation if patient is discharged?: Car Patient referred to: Other (Comment)(pain management centers)  On Site Evaluation by:   Reviewed with Physician:    Judeth Porch Fancy Dunkley 09/02/2017 1:10 PM

## 2017-09-02 NOTE — H&P (Signed)
Behavioral Health Medical Screening Exam  [name] is an 67 y.o. female.  Total Time spent with patient: 30 minutes  Psychiatric Specialty Exam: Physical Exam  Nursing note and vitals reviewed. Constitutional: She is oriented to person, place, and time.  Appears underweight  Cardiovascular: Normal rate.  Respiratory: Effort normal.  Neurological: She is alert and oriented to person, place, and time.  Skin: Skin is warm.    Review of Systems  Constitutional: Positive for weight loss.  HENT: Negative.   Eyes: Negative.   Respiratory: Negative.   Cardiovascular: Negative.   Gastrointestinal: Negative.   Genitourinary: Negative.   Musculoskeletal: Negative.   Skin: Negative.   Neurological: Negative.   Endo/Heme/Allergies: Negative.   Psychiatric/Behavioral: Positive for depression. Negative for hallucinations, substance abuse and suicidal ideas. The patient is nervous/anxious.     Blood pressure (!) 154/87, pulse 81, temperature 98.3 F (36.8 C), resp. rate 18, SpO2 100 %.There is no height or weight on file to calculate BMI.  General Appearance: Casual  Eye Contact:  Good  Speech:  Clear and Coherent and Normal Rate  Volume:  Normal  Mood:  Anxious  Affect:  Congruent  Thought Process:  Goal Directed and Descriptions of Associations: Intact  Orientation:  Full (Time, Place, and Person)  Thought Content:  WDL  Suicidal Thoughts:  No  Homicidal Thoughts:  No  Memory:  Immediate;   Good Recent;   Good Remote;   Good  Judgement:  Good  Insight:  Good  Psychomotor Activity:  Normal  Concentration: Concentration: Good and Attention Span: Good  Recall:  Good  Fund of Knowledge:Good  Language: Good  Akathisia:  No  Handed:  Right  AIMS (if indicated):     Assets:  Communication Skills Desire for Improvement Financial Resources/Insurance Housing Physical Health Social Support Transportation  Sleep:       Musculoskeletal: Strength & Muscle Tone: within  normal limits Gait & Station: normal Patient leans: N/A  Blood pressure (!) 154/87, pulse 81, temperature 98.3 F (36.8 C), resp. rate 18, SpO2 100 %.  Recommendations:  Based on my evaluation the patient does not appear to have an emergency medical condition.  Martin, FNP 09/02/2017, 12:45 PM

## 2017-09-03 ENCOUNTER — Other Ambulatory Visit: Payer: Self-pay

## 2017-09-03 ENCOUNTER — Emergency Department (HOSPITAL_COMMUNITY): Payer: Medicaid Other

## 2017-09-03 ENCOUNTER — Encounter (HOSPITAL_COMMUNITY): Payer: Self-pay

## 2017-09-03 ENCOUNTER — Emergency Department (HOSPITAL_COMMUNITY)
Admission: EM | Admit: 2017-09-03 | Discharge: 2017-09-03 | Disposition: A | Discharge status: Home / Self Care | Payer: Medicaid Other | Attending: Emergency Medicine | Admitting: Emergency Medicine

## 2017-09-03 DIAGNOSIS — Z85528 Personal history of other malignant neoplasm of kidney: Secondary | ICD-10-CM | POA: Insufficient documentation

## 2017-09-03 DIAGNOSIS — M549 Dorsalgia, unspecified: Secondary | ICD-10-CM | POA: Diagnosis not present

## 2017-09-03 DIAGNOSIS — Z79899 Other long term (current) drug therapy: Secondary | ICD-10-CM | POA: Diagnosis not present

## 2017-09-03 DIAGNOSIS — F17210 Nicotine dependence, cigarettes, uncomplicated: Secondary | ICD-10-CM | POA: Insufficient documentation

## 2017-09-03 DIAGNOSIS — G8929 Other chronic pain: Secondary | ICD-10-CM | POA: Insufficient documentation

## 2017-09-03 DIAGNOSIS — F419 Anxiety disorder, unspecified: Secondary | ICD-10-CM | POA: Insufficient documentation

## 2017-09-03 DIAGNOSIS — M79604 Pain in right leg: Secondary | ICD-10-CM | POA: Insufficient documentation

## 2017-09-03 DIAGNOSIS — J449 Chronic obstructive pulmonary disease, unspecified: Secondary | ICD-10-CM | POA: Diagnosis not present

## 2017-09-03 DIAGNOSIS — M79605 Pain in left leg: Secondary | ICD-10-CM | POA: Insufficient documentation

## 2017-09-03 DIAGNOSIS — R0602 Shortness of breath: Secondary | ICD-10-CM | POA: Diagnosis not present

## 2017-09-03 LAB — CBC WITH DIFFERENTIAL/PLATELET
Basophils Absolute: 0 10*3/uL (ref 0.0–0.1)
Basophils Relative: 0 %
EOS ABS: 0.2 10*3/uL (ref 0.0–0.7)
Eosinophils Relative: 3 %
HEMATOCRIT: 34.1 % — AB (ref 36.0–46.0)
HEMOGLOBIN: 11.3 g/dL — AB (ref 12.0–15.0)
Lymphocytes Relative: 20 %
Lymphs Abs: 1.4 10*3/uL (ref 0.7–4.0)
MCH: 28.8 pg (ref 26.0–34.0)
MCHC: 33.1 g/dL (ref 30.0–36.0)
MCV: 87 fL (ref 78.0–100.0)
MONOS PCT: 7 %
Monocytes Absolute: 0.5 10*3/uL (ref 0.1–1.0)
NEUTROS PCT: 70 %
Neutro Abs: 4.8 10*3/uL (ref 1.7–7.7)
PLATELETS: 288 10*3/uL (ref 150–400)
RBC: 3.92 MIL/uL (ref 3.87–5.11)
RDW: 14.4 % (ref 11.5–15.5)
WBC: 6.9 10*3/uL (ref 4.0–10.5)

## 2017-09-03 LAB — COMPREHENSIVE METABOLIC PANEL
ALT: 13 U/L (ref 0–44)
AST: 18 U/L (ref 15–41)
Albumin: 4.1 g/dL (ref 3.5–5.0)
Alkaline Phosphatase: 97 U/L (ref 38–126)
Anion gap: 8 (ref 5–15)
BUN: 7 mg/dL — ABNORMAL LOW (ref 8–23)
CHLORIDE: 103 mmol/L (ref 98–111)
CO2: 30 mmol/L (ref 22–32)
Calcium: 9.3 mg/dL (ref 8.9–10.3)
Creatinine, Ser: 0.66 mg/dL (ref 0.44–1.00)
Glucose, Bld: 100 mg/dL — ABNORMAL HIGH (ref 70–99)
POTASSIUM: 4 mmol/L (ref 3.5–5.1)
Sodium: 141 mmol/L (ref 135–145)
Total Bilirubin: 0.5 mg/dL (ref 0.3–1.2)
Total Protein: 7.1 g/dL (ref 6.5–8.1)

## 2017-09-03 LAB — ETHANOL: Alcohol, Ethyl (B): <10 mg/dL (ref <10)

## 2017-09-03 MED ORDER — SODIUM CHLORIDE 0.9 % IV BOLUS
1000.0000 mL | Freq: Once | INTRAVENOUS | Status: AC
  Administered 2017-09-03: 1000 mL via INTRAVENOUS

## 2017-09-03 MED ORDER — LORAZEPAM 2 MG/ML IJ SOLN
1.0000 mg | Freq: Once | INTRAMUSCULAR | Status: AC
  Administered 2017-09-03: 1 mg via INTRAVENOUS
  Filled 2017-09-03: qty 1

## 2017-09-03 MED ORDER — SODIUM CHLORIDE 0.9 % IV SOLN
INTRAVENOUS | Status: DC
  Administered 2017-09-03: 13:00:00 via INTRAVENOUS

## 2017-09-03 NOTE — Progress Notes (Signed)
CSW extensively spoke with patient's husband, Ronalee Belts, for more than one hour.   Ronalee Belts repeatedly expressed frustration with his wife's care and the barriers to placing his wife within a skilled nursing facility. Ronalee Belts expressed dissappointment with frustration with the patient not meeting inpatient criteria. Ronalee Belts made aware that patient taking suboxone is a barrier to patient finding placement within a nursing home, SNF, or ALF.   CSW reviewed previous inpatient admission and the SNF barriers discussed with patient at spouse then.   CSW inquired about Home Health services, Ronalee Belts initially stated Home Health only came to the home once and later stated Home Health (through [facility]) came multiple times, but the services were unsatisfactory with caregivers arriving late, leaving early, and sleeping during their shifts.  CSW consulted Case Management in an effort to coordinate Home Health services that may be more frequent or reliable.   No other social work needs identified at this time. Please reconsult should additional needs arise. CSW signing off.  Stephanie Acre, [place] Social Worker [phone]

## 2017-09-03 NOTE — ED Notes (Signed)
Pt has pure wick in place

## 2017-09-03 NOTE — ED Notes (Signed)
Bed: [facility] Expected date:  Expected time:  Means of arrival:  Comments: 

## 2017-09-03 NOTE — Discharge Instructions (Addendum)
Follow-up with your doctor next week for your pain management and anxiety medication needs

## 2017-09-03 NOTE — ED Notes (Signed)
LCSW [place] AT BEDSIDE SPEAKING WITH PT

## 2017-09-03 NOTE — ED Notes (Signed)
Pt ambulated to restroom with help of stand by assist. Pt voided around the hat in the oilet so unable to collect urine specimen.

## 2017-09-03 NOTE — ED Notes (Signed)
ED Provider at bedside. EDP LONG AT BEDSIDE SPEAKING WITH HUSBAND AND PT.

## 2017-09-03 NOTE — ED Notes (Signed)
PT CAN CALM WHEN ENCOURAGED TO DO SO

## 2017-09-03 NOTE — ED Notes (Signed)
ED Provider at bedside. [name]

## 2017-09-03 NOTE — ED Notes (Signed)
CASE MANAGEMENT ALESIA [phone] SPOKE WITH PT AND HAS ATT TEMPTED TO CALL HUSBAND AT HOME. PT IS AWARE OF PENDING HOME CARE FOR THIS PT

## 2017-09-03 NOTE — ED Notes (Signed)
CONTACT INFORMATION   MIKE ([place])  CELL [place]

## 2017-09-03 NOTE — ED Triage Notes (Signed)
Per Husband, Pt has chronic pain and seeking rehab placement. She has psychiatric issues " my nerves are shot". "I cannot take this anymore". Husband and wife states they are unable to care for her at home anymore. EDP [place] at bedside. Husband requesting pain management. Husband has stated they have tried home aids at home without success.

## 2017-09-03 NOTE — ED Provider Notes (Signed)
[facility] DEPT Provider Note   CSN: [id number] Arrival date & time: 09/03/17  1145     History   Chief Complaint Chief Complaint  Patient presents with  . Pain  . Medical Clearance    HPI [name] is a 67 y.o. female.  67 year old female with history of COPD, chronic pain syndrome who presents with worsening diffuse chronic pain in her back and legs similar to her prior episodes.  She is taking Suboxone but states that this is not controlling her symptoms.  She has had more anxiety.  Has had a cough similar to her prior COPD exacerbations.  Seen here 2 days ago and was evaluated for possible fever and UTI.  Patient had a work-up that showed a positive UDS for opiates but normal chest x-ray as well as laboratory studies.  Family at this time is requesting evaluation for possible nursing home placement.     Past Medical History:  Diagnosis Date  . Cancer ([place])    renal ca  . COPD (chronic obstructive pulmonary disease) ([place])   . Drug-seeking behavior   . Pancreatitis   . Pancreatitis   . Seizures ([place])   . Substance abuse [facility])     Patient Active Problem List   Diagnosis Date Noted  . Osteoarthritis of facet joint of lumbar spine 07/26/2017  . Low back pain radiating to both legs 07/26/2017  . 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 Failure to thrive in adult 06/21/2017  . Pressure injury of skin 04/06/2017  . Pressure injury of skin of right ischial tuberosity region: Stage 2 04/06/2017  . Acute urinary retention 04/04/2017  . Benzodiazepine dependence ([place])   . AKI (acute kidney injury) ([place]) 04/03/2017  . Hip fracture, right ([place]) 06/02/2016  . Narcotic addiction ([place]) 02/29/2016  . Abdominal pain, chronic, epigastric- due to chronic pancreatitis 02/29/2016  . COPD (chronic obstructive pulmonary disease) ([place]) 02/28/2016  . Cough 09/03/2015  . Abdominal pain 01/03/2015  . Hyponatremia  01/03/2015  . Hypocalcemia 01/03/2015  . Underweight 01/03/2015  . COPD GOLD III with reversible component  07/17/2014  . DTs (delirium tremens) ([place]) 06/02/2013  . Chronic alcoholic pancreatitis ([place]) 06/02/2013  . Lactic acidosis 06/02/2013  . Recurrent acute pancreatitis 03/28/2013  . Alcohol withdrawal ([place]) 03/28/2013  . Pancreatitis 03/09/2013  . Tobacco abuse 03/09/2013  . Alcohol withdrawal syndrome with perceptual disturbance ([place]) 03/09/2013  . Acute alcoholic pancreatitis 94/85/4627  . Benzodiazepine withdrawal ([place]) 02/09/2013  . Protein-calorie malnutrition, severe ([place]) 02/09/2013  . Hypokalemia 02/27/2011  . Abdominal pain, acute 02/25/2011  . Nausea and vomiting 02/25/2011  . Thrombocytopenia ([place]) 02/25/2011  . COPD with acute exacerbation ([place]) 02/25/2011  . HTN (hypertension), malignant 02/25/2011  . KIDNEY CANCER 10/18/2008  . Anxiety state 10/18/2008  . GERD 10/18/2008  . PANCREATITIS, CHRONIC- atrophic pancreas 10/18/2008  . DYSPNEA 10/18/2008  . GASTRIC ULCER, HX OF 10/18/2008    Past Surgical History:  Procedure Laterality Date  . IR GENERIC HISTORICAL  05/08/2015   IR RADIOLOGIST EVAL & MGMT 05/08/2015 Aletta Edouard, MD [facility] INTERV RAD  . IR GENERIC HISTORICAL  01/08/2014   IR RADIOLOGIST EVAL & MGMT 01/08/2014 Aletta Edouard, MD [facility] INTERV RAD  . KIDNEY SURGERY     removed cancerous lesions  . PARTIAL GASTRECTOMY       OB History   None      Home Medications    Prior to Admission medications   Medication Sig Start Date End  Date Taking? Authorizing Provider  busPIRone (BUSPAR) 10 MG tablet Take 1 tablet (10 mg total) by mouth 3 (three) times daily. 08/23/17   Azzie Glatter, FNP  famotidine (PEPCID) 20 MG tablet Take 1 tablet (20 mg total) by mouth daily. 08/28/17   Khatri, Hina, PA-C  hydrOXYzine (ATARAX/VISTARIL) 25 MG tablet Take 1 tablet (25 mg total) by mouth 3 (three) times daily as needed (anxiety). 08/08/17   Azzie Glatter, FNP    mometasone-formoterol (DULERA) 200-5 MCG/ACT AERO Inhale 2 puffs into the lungs 2 (two) times daily. 08/08/17 09/07/17  Azzie Glatter, FNP  ondansetron (ZOFRAN ODT) 4 MG disintegrating tablet Take 1 tablet (4 mg total) by mouth every 8 (eight) hours as needed for nausea or vomiting. Patient not taking: Reported on 08/30/2017 08/28/17   Delia Heady, PA-C  ondansetron (ZOFRAN) 4 MG tablet Take 1 tablet (4 mg total) by mouth every 8 (eight) hours as needed for nausea or vomiting. 08/23/17   Azzie Glatter, FNP  polyethylene glycol powder (GLYCOLAX/MIRALAX) powder Mix 1 capful of powder into drink and take by mouth 1-3 times daily until daily soft stools OTC 08/30/17   Little, Wenda Overland, MD  PROAIR HFA 108 [phone] Base) MCG/ACT inhaler Inhale 90 Inhalers into the lungs every 4 (four) hours as needed for wheezing or shortness of breath.  08/02/17   [provider]  SUBOXONE 8-2 MG FILM Take 1 Film by mouth daily.  07/30/17   [provider]  tiotropium (SPIRIVA HANDIHALER) 18 MCG inhalation capsule Place 1 capsule (18 mcg total) into inhaler and inhale daily. 08/08/17   Azzie Glatter, FNP    Family History Family History  Problem Relation Age of Onset  . CAD Mother   . Alcoholism Father   . COPD Father     Social History Social History   Tobacco Use  . Smoking status: Current Every Day Smoker    Packs/day: 1.00    Years: 30.00    Pack years: 30.00    Types: Cigarettes  . Smokeless tobacco: Never Used  . Tobacco comment: smoking up to 1.5 ppd per husband.   Substance Use Topics  . Alcohol use: Not Currently    Alcohol/week: 0.0 standard drinks  . Drug use: No    Comment: Hx of polysubstance drug abuse     Allergies   Aspirin and Chlorpromazine hcl   Review of Systems Review of Systems  All other systems reviewed and are negative.    Physical Exam Updated Vital Signs BP (!) 180/147   Pulse (!) 106   Temp 97.6 F (36.4 C) (Oral)   Resp 20   Ht 1.626 m  (5\' 4" )   Wt 27.7 kg   SpO2 96%   BMI 10.48 kg/m   Physical Exam  Constitutional: She is oriented to person, place, and time. She appears cachectic. She has a sickly appearance. She appears ill.  HENT:  Head: Normocephalic and atraumatic.  Eyes: Pupils are equal, round, and reactive to light. Conjunctivae, EOM and lids are normal.  Neck: Normal range of motion. Neck supple. No tracheal deviation present. No thyroid mass present.  Cardiovascular: Normal rate, regular rhythm and normal heart sounds. Exam reveals no gallop.  No murmur heard. Pulmonary/Chest: Effort normal and breath sounds normal. No stridor. No respiratory distress. She has no decreased breath sounds. She has no wheezes. She has no rhonchi. She has no rales.  Abdominal: Soft. Normal appearance and bowel sounds are normal. She exhibits no  distension. There is no tenderness. There is no rebound and no CVA tenderness.  Musculoskeletal: Normal range of motion. She exhibits no edema or tenderness.  Neurological: She is alert and oriented to person, place, and time. She displays atrophy. No cranial nerve deficit or sensory deficit. GCS eye subscore is 4. GCS verbal subscore is 5. GCS motor subscore is 6.  Skin: Skin is warm and dry. No abrasion and no rash noted.  Psychiatric: Her affect is labile.  Nursing note and vitals reviewed.    ED Treatments / Results  Labs (all labs ordered are listed, but only abnormal results are displayed) Labs Reviewed  CBC WITH DIFFERENTIAL/PLATELET  COMPREHENSIVE METABOLIC PANEL  ETHANOL  RAPID URINE DRUG SCREEN, HOSP PERFORMED  URINALYSIS, ROUTINE W REFLEX MICROSCOPIC    EKG None  Radiology No results found.  Procedures Procedures (including critical care time)  Medications Ordered in ED Medications  sodium chloride 0.9 % bolus 1,000 mL (has no administration in time range)  0.9 %  sodium chloride infusion (has no administration in time range)  LORazepam (ATIVAN) injection 1 mg  (has no administration in time range)     Initial Impression / Assessment and Plan / ED Course  I have reviewed the triage vital signs and the nursing notes.  Pertinent labs & imaging results that were available during my care of the patient were reviewed by me and considered in my medical decision making (see chart for details).     She given IV fluids and Ativan here and does feel better.  States that her anxiety remains constant.  I informed her that she would not be receiving any opiates.  Patient has been taken off of benzodiazepines by her physician.  Labs reviewed and no acute abnormalities.  X-ray without acute findings.  Patient seen by social work who states that patient does not meet criteria for nursing home placement.  Have consulted case management who will attempt to get patient more home help.  Final Clinical Impressions(s) / ED Diagnoses   Final diagnoses:  SOB (shortness of breath)    ED Discharge Orders    None       Lacretia Leigh, MD 09/03/17 1550

## 2017-09-03 NOTE — Care Management Note (Signed)
Case Management Note  Patient Details  Name: [name] MRN: [id number] Date of Birth: [birth year]  Subjective/Objective:      FTT              Action/Plan: NCM spoke to pt and gave permission to speak to husband. States she has RW and bedside commode at home. States she had a [place] Medicaid aide but she never was able to get along with her aides. NCM contacted husband and left message for return call. Received return call from husband, [name]. He is declining HH at this time. States he wants pt to go to SNF or hospital admission. Explained NCM will contact ED attending and speak with CSW.   Contacted CSW and due to current medication Suboxone, she will be unable to place at a SNF. NCM spoke to attending ED physician and plan is dc. Did not meet criteria for admission.   Expected Discharge Date:             Expected Discharge Plan:  [place]  In-House Referral:  Clinical Social Work  Discharge planning Services  CM Consult  Post Acute Care Choice:  Home Health Choice offered to:  Patient, Spouse  DME Arranged:  N/A DME Agency:  NA  HH Arranged:  NA HH Agency:  NA  Status of Service:  Completed, signed off  If discussed at [place] of Stay Meetings, dates discussed:    Additional Comments:  Erenest Rasher, RN 09/03/2017, 4:13 PM

## 2017-09-03 NOTE — ED Notes (Signed)
Unable to sign discharge esignature.

## 2017-09-03 NOTE — ED Triage Notes (Signed)
Pt's husband provided background for pt. Per husband, pt has "chronic COPD and chronic pancreatitis". He states that her pain medicine is not helping her at all. Husband states wife is going to die if she does not have help and is not placed in a facility. Pt states that her shoulders, abdomen, and ankles are hurting, all down her body. Pt and husband are requesting to speak with a Education officer, museum. Husband is frustrated with the "round and round".

## 2017-09-03 NOTE — ED Notes (Signed)
No urine output at this time

## 2017-09-03 NOTE — Progress Notes (Signed)
CSW briefly spoke with patient at bedside. Patient encouraged CSW to speak with husband Ronalee Belts and granted verbal permission.  When asked what her goals for treatment were patient states "to get myself straight," when asked further patient states "to get my body straight because it isn't working." Patient states her husband wants her in a care facility and patient would be receptive.   CSW inquired if patient was still current with [facility] from last inpatient hospitalization, patient states she is not and is unable to recall when [facility] services were discontinued.  CSW stated that long term care placements are typically not facilitated from the emergency department, but will follow up with patient's husband.  CSW attempted to reach husband, Ronalee Belts [phone] left a VM requesting call back.  CSW continuing to follow.  Stephanie Acre, [place] Social Worker [phone]

## 2017-09-03 NOTE — ED Notes (Signed)
AWARE OF NEED FOR URINE. NO URINE OUT OF [place]. SHE STATES SHE WILL WALK TO URINATE AFTER SHE EATS

## 2017-09-03 NOTE — ED Notes (Signed)
Husband was rushing D/C. He stated he just wanted her IV out so they could leave. Stated he didn't want her changed back into her clothes. Stated she normally goes home in a gown.

## 2017-09-03 NOTE — ED Notes (Signed)
Lab needs recollect on CBC w/ diff

## 2017-09-04 ENCOUNTER — Other Ambulatory Visit: Payer: Self-pay

## 2017-09-04 ENCOUNTER — Encounter (HOSPITAL_COMMUNITY): Payer: Self-pay | Admitting: Emergency Medicine

## 2017-09-04 ENCOUNTER — Emergency Department (HOSPITAL_COMMUNITY): Payer: Medicaid Other

## 2017-09-04 ENCOUNTER — Emergency Department (HOSPITAL_COMMUNITY)
Admission: EM | Admit: 2017-09-04 | Discharge: 2017-09-04 | Disposition: A | Discharge status: Home / Self Care | Payer: Medicaid Other | Attending: Emergency Medicine | Admitting: Emergency Medicine

## 2017-09-04 DIAGNOSIS — C649 Malignant neoplasm of unspecified kidney, except renal pelvis: Secondary | ICD-10-CM | POA: Diagnosis not present

## 2017-09-04 DIAGNOSIS — I10 Essential (primary) hypertension: Secondary | ICD-10-CM | POA: Diagnosis not present

## 2017-09-04 DIAGNOSIS — Z79899 Other long term (current) drug therapy: Secondary | ICD-10-CM | POA: Insufficient documentation

## 2017-09-04 DIAGNOSIS — F17210 Nicotine dependence, cigarettes, uncomplicated: Secondary | ICD-10-CM | POA: Insufficient documentation

## 2017-09-04 DIAGNOSIS — F419 Anxiety disorder, unspecified: Secondary | ICD-10-CM | POA: Insufficient documentation

## 2017-09-04 DIAGNOSIS — J449 Chronic obstructive pulmonary disease, unspecified: Secondary | ICD-10-CM | POA: Insufficient documentation

## 2017-09-04 DIAGNOSIS — R1030 Lower abdominal pain, unspecified: Secondary | ICD-10-CM | POA: Diagnosis present

## 2017-09-04 LAB — CBC WITH DIFFERENTIAL/PLATELET
BASOS ABS: 0 10*3/uL (ref 0.0–0.1)
BASOS PCT: 0 %
Eosinophils Absolute: 0.1 10*3/uL (ref 0.0–0.7)
Eosinophils Relative: 1 %
HCT: 33.5 % — ABNORMAL LOW (ref 36.0–46.0)
Hemoglobin: 10.9 g/dL — ABNORMAL LOW (ref 12.0–15.0)
Lymphocytes Relative: 20 %
Lymphs Abs: 1.8 10*3/uL (ref 0.7–4.0)
MCH: 28.5 pg (ref 26.0–34.0)
MCHC: 32.5 g/dL (ref 30.0–36.0)
MCV: 87.7 fL (ref 78.0–100.0)
Monocytes Absolute: 0.5 10*3/uL (ref 0.1–1.0)
Monocytes Relative: 6 %
Neutro Abs: 6.3 10*3/uL (ref 1.7–7.7)
Neutrophils Relative %: 73 %
Platelets: 255 10*3/uL (ref 150–400)
RBC: 3.82 MIL/uL — AB (ref 3.87–5.11)
RDW: 14.5 % (ref 11.5–15.5)
WBC: 8.8 10*3/uL (ref 4.0–10.5)

## 2017-09-04 LAB — RAPID URINE DRUG SCREEN, HOSP PERFORMED
Amphetamines: NOT DETECTED
Barbiturates: NOT DETECTED
Benzodiazepines: POSITIVE — AB
Cocaine: NOT DETECTED
OPIATES: NOT DETECTED
Tetrahydrocannabinol: NOT DETECTED

## 2017-09-04 LAB — COMPREHENSIVE METABOLIC PANEL
ALBUMIN: 3.6 g/dL (ref 3.5–5.0)
ALK PHOS: 82 U/L (ref 38–126)
ALT: 10 U/L (ref 0–44)
AST: 16 U/L (ref 15–41)
Anion gap: 8 (ref 5–15)
BUN: 10 mg/dL (ref 8–23)
CALCIUM: 8.7 mg/dL — AB (ref 8.9–10.3)
CO2: 27 mmol/L (ref 22–32)
Chloride: 105 mmol/L (ref 98–111)
Creatinine, Ser: 0.85 mg/dL (ref 0.44–1.00)
GFR calc Af Amer: >60 mL/min (ref >60)
Glucose, Bld: 122 mg/dL — ABNORMAL HIGH (ref 70–99)
Potassium: 3.5 mmol/L (ref 3.5–5.1)
Sodium: 140 mmol/L (ref 135–145)
TOTAL PROTEIN: 6.1 g/dL — AB (ref 6.5–8.1)
Total Bilirubin: 0.4 mg/dL (ref 0.3–1.2)

## 2017-09-04 LAB — URINALYSIS, ROUTINE W REFLEX MICROSCOPIC
BACTERIA UA: NONE SEEN
BILIRUBIN URINE: NEGATIVE
GLUCOSE, UA: NEGATIVE mg/dL
Ketones, ur: NEGATIVE mg/dL
LEUKOCYTES UA: NEGATIVE
NITRITE: NEGATIVE
PROTEIN: NEGATIVE mg/dL
Specific Gravity, Urine: 1.015 (ref 1.005–1.030)
pH: 7 (ref 5.0–8.0)

## 2017-09-04 LAB — CULTURE, BLOOD (ROUTINE X 2)
CULTURE: NO GROWTH
Culture: NO GROWTH
SPECIAL REQUESTS: ADEQUATE
SPECIAL REQUESTS: ADEQUATE

## 2017-09-04 LAB — LIPASE, BLOOD: Lipase: 21 U/L (ref 11–51)

## 2017-09-04 MED ORDER — IPRATROPIUM-ALBUTEROL 0.5-2.5 (3) MG/3ML IN SOLN: 3.0000 mL | Freq: Once | RESPIRATORY_TRACT | Status: DC

## 2017-09-04 MED ORDER — LORAZEPAM 1 MG PO TABS
1.0000 mg | ORAL_TABLET | Freq: Once | ORAL | Status: AC
  Administered 2017-09-04: 1 mg via ORAL
  Filled 2017-09-04: qty 1

## 2017-09-04 NOTE — ED Provider Notes (Signed)
[facility] DEPT Provider Note   CSN: [id number] Arrival date & time: 09/04/17  1408     History   Chief Complaint No chief complaint on file.   HPI [name] is a 67 y.o. female has known history of COPD, chronic pain who presents for evaluation of worsening of her chronic pain in her bilateral legs.  Patient also reports she is having some lower abdominal pain and cough.  He states she is not having any nausea/vomiting.  Patient reports she still has been able to walk.  Patient states she feels like "her nerves are bothering her she needs something for it." She states that his is making her anxious and worried.  She reports his cough is been ongoing for a few days.  She states is productive of phlegm.  She has not had any difficulty breathing or chest pain.  Patient reports that she may have had some fever yesterday but does not recall specific number.  Patient denies any chest pain, difficulty breathing, vomiting, numbness/weakness of her arms or legs.  The history is provided by the patient.    Past Medical History:  Diagnosis Date  . Cancer ([place])    renal ca  . COPD (chronic obstructive pulmonary disease) ([place])   . Drug-seeking behavior   . Pancreatitis   . Pancreatitis   . Seizures ([place])   . Substance abuse [facility])     Patient Active Problem List   Diagnosis Date Noted  . Osteoarthritis of facet joint of lumbar spine 07/26/2017  . Low back pain radiating to both legs 07/26/2017  . 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 Failure to thrive in adult 06/21/2017  . Pressure injury of skin 04/06/2017  . Pressure injury of skin of right ischial tuberosity region: Stage 2 04/06/2017  . Acute urinary retention 04/04/2017  . Benzodiazepine dependence ([place])   . AKI (acute kidney injury) ([place]) 04/03/2017  . Hip fracture, right ([place]) 06/02/2016  . Narcotic addiction ([place]) 02/29/2016  . Abdominal pain, chronic,  epigastric- due to chronic pancreatitis 02/29/2016  . COPD (chronic obstructive pulmonary disease) ([place]) 02/28/2016  . Cough 09/03/2015  . Abdominal pain 01/03/2015  . Hyponatremia 01/03/2015  . Hypocalcemia 01/03/2015  . Underweight 01/03/2015  . COPD GOLD III with reversible component  07/17/2014  . DTs (delirium tremens) ([place]) 06/02/2013  . Chronic alcoholic pancreatitis ([place]) 06/02/2013  . Lactic acidosis 06/02/2013  . Recurrent acute pancreatitis 03/28/2013  . Alcohol withdrawal ([place]) 03/28/2013  . Pancreatitis 03/09/2013  . Tobacco abuse 03/09/2013  . Alcohol withdrawal syndrome with perceptual disturbance ([place]) 03/09/2013  . Acute alcoholic pancreatitis 40/98/1191  . Benzodiazepine withdrawal ([place]) 02/09/2013  . Protein-calorie malnutrition, severe ([place]) 02/09/2013  . Hypokalemia 02/27/2011  . Abdominal pain, acute 02/25/2011  . Nausea and vomiting 02/25/2011  . Thrombocytopenia ([place]) 02/25/2011  . COPD with acute exacerbation ([place]) 02/25/2011  . HTN (hypertension), malignant 02/25/2011  . KIDNEY CANCER 10/18/2008  . Anxiety state 10/18/2008  . GERD 10/18/2008  . PANCREATITIS, CHRONIC- atrophic pancreas 10/18/2008  . DYSPNEA 10/18/2008  . GASTRIC ULCER, HX OF 10/18/2008    Past Surgical History:  Procedure Laterality Date  . IR GENERIC HISTORICAL  05/08/2015   IR RADIOLOGIST EVAL & MGMT 05/08/2015 Aletta Edouard, MD [facility] INTERV RAD  . IR GENERIC HISTORICAL  01/08/2014   IR RADIOLOGIST EVAL & MGMT 01/08/2014 Aletta Edouard, MD [facility] INTERV RAD  . KIDNEY SURGERY     removed cancerous lesions  . PARTIAL  GASTRECTOMY       OB History   None      Home Medications    Prior to Admission medications   Medication Sig Start Date End Date Taking? Authorizing Provider  busPIRone (BUSPAR) 10 MG tablet Take 1 tablet (10 mg total) by mouth 3 (three) times daily. 08/23/17   Azzie Glatter, FNP  famotidine (PEPCID) 20 MG tablet Take 1 tablet (20 mg total) by mouth  daily. Patient not taking: Reported on 09/03/2017 08/28/17   Delia Heady, PA-C  hydrOXYzine (ATARAX/VISTARIL) 25 MG tablet Take 1 tablet (25 mg total) by mouth 3 (three) times daily as needed (anxiety). 08/08/17   Azzie Glatter, FNP  mometasone-formoterol (DULERA) 200-5 MCG/ACT AERO Inhale 2 puffs into the lungs 2 (two) times daily. 08/08/17 09/07/17  Azzie Glatter, FNP  ondansetron (ZOFRAN ODT) 4 MG disintegrating tablet Take 1 tablet (4 mg total) by mouth every 8 (eight) hours as needed for nausea or vomiting. Patient not taking: Reported on 08/30/2017 08/28/17   Delia Heady, PA-C  ondansetron (ZOFRAN) 4 MG tablet Take 1 tablet (4 mg total) by mouth every 8 (eight) hours as needed for nausea or vomiting. 08/23/17   Azzie Glatter, FNP  polyethylene glycol powder (GLYCOLAX/MIRALAX) powder Mix 1 capful of powder into drink and take by mouth 1-3 times daily until daily soft stools OTC Patient not taking: Reported on 09/03/2017 08/30/17   Little, Wenda Overland, MD  PROAIR HFA 108 [phone] Base) MCG/ACT inhaler Inhale 90 Inhalers into the lungs every 4 (four) hours as needed for wheezing or shortness of breath.  08/02/17   [provider]  SUBOXONE 8-2 MG FILM Take 1 Film by mouth daily.  07/30/17   [provider]  tiotropium (SPIRIVA HANDIHALER) 18 MCG inhalation capsule Place 1 capsule (18 mcg total) into inhaler and inhale daily. 08/08/17   Azzie Glatter, FNP    Family History Family History  Problem Relation Age of Onset  . CAD Mother   . Alcoholism Father   . COPD Father     Social History Social History   Tobacco Use  . Smoking status: Current Every Day Smoker    Packs/day: 1.00    Years: 30.00    Pack years: 30.00    Types: Cigarettes  . Smokeless tobacco: Never Used  . Tobacco comment: smoking up to 1.5 ppd per husband.   Substance Use Topics  . Alcohol use: Not Currently    Alcohol/week: 0.0 standard drinks  . Drug use: No    Comment: Hx of polysubstance drug  abuse     Allergies   Aspirin and Chlorpromazine hcl   Review of Systems Review of Systems  Respiratory: Positive for cough. Negative for shortness of breath.   Cardiovascular: Negative for chest pain.  Gastrointestinal: Positive for abdominal pain (Chronic). Negative for nausea and vomiting.  Genitourinary: Negative for dysuria and hematuria.  Musculoskeletal:       Leg pain  Neurological: Negative for headaches.  Psychiatric/Behavioral: The patient is nervous/anxious.   All other systems reviewed and are negative.    Physical Exam Updated Vital Signs BP (!) 145/121 (BP Location: Left Arm)   Pulse 83   Temp 98.1 F (36.7 C) (Oral)   Resp 20   Ht 5\' 4"  (1.626 m)   Wt 28.1 kg   SpO2 100%   BMI 10.64 kg/m   Physical Exam  Constitutional: She is oriented to person, place, and time.  Frail and elderly appearing  HENT:  Head: Normocephalic and atraumatic.  Mouth/Throat: Oropharynx is clear and moist and mucous membranes are normal.  Eyes: Pupils are equal, round, and reactive to light. Conjunctivae, EOM and lids are normal.  Neck: Full passive range of motion without pain.  Cardiovascular: Normal rate, regular rhythm, normal heart sounds and normal pulses. Exam reveals no gallop and no friction rub.  No murmur heard. Pulses:      Radial pulses are 2+ on the right side, and 2+ on the left side.       Dorsalis pedis pulses are 2+ on the right side, and 2+ on the left side.  Pulmonary/Chest: Effort normal. She has wheezes.  Faint wheezing noted.  No evidence of respiratory distress.  Able to speak in full sentences without any difficulty.  Abdominal: Soft. Normal appearance. There is generalized tenderness. There is no rigidity, no guarding and no CVA tenderness.  Abdomen soft, nondistended.  Patient has some generalized tenderness that is improved with distractibility.  Patient is able to talk and tell me all about her legs as I am examining her abdomen with no signs of  distress.  Musculoskeletal: Normal range of motion.  Neurological: She is alert and oriented to person, place, and time.  Follows commands, Moves all extremities  5/5 strength to BUE and BLE  Sensation intact throughout all major nerve distributions  Skin: Skin is warm and dry. Capillary refill takes less than 2 seconds.  Psychiatric: She has a normal mood and affect. Her speech is normal.  Nursing note and vitals reviewed.    ED Treatments / Results  Labs (all labs ordered are listed, but only abnormal results are displayed) Labs Reviewed  URINALYSIS, ROUTINE W REFLEX MICROSCOPIC - Abnormal; Notable for the following components:      Result Value   Hgb urine dipstick SMALL (*)    All other components within normal limits  RAPID URINE DRUG SCREEN, HOSP PERFORMED - Abnormal; Notable for the following components:   Benzodiazepines POSITIVE (*)    All other components within normal limits  COMPREHENSIVE METABOLIC PANEL - Abnormal; Notable for the following components:   Glucose, Bld 122 (*)    Calcium 8.7 (*)    Total Protein 6.1 (*)    All other components within normal limits  CBC WITH DIFFERENTIAL/PLATELET - Abnormal; Notable for the following components:   RBC 3.82 (*)    Hemoglobin 10.9 (*)    HCT 33.5 (*)    All other components within normal limits  LIPASE, BLOOD    EKG None  Radiology Dg Chest 2 View  Result Date: 09/04/2017 CLINICAL DATA:  Patient with productive cough EXAM: CHEST - 2 VIEW COMPARISON:  Chest radiograph 09/03/2017 FINDINGS: Stable cardiac and mediastinal contours. Re-demonstrated 3.4 cm mass projecting over the posterior left lower lung. Pulmonary hyperinflation. Emphysematous change. No large area pulmonary consolidation. No pleural effusion or pneumothorax. Thoracic spine degenerative changes. IMPRESSION: There is a 3.4 cm mass projecting over the medial left lower lung. This needs dedicated evaluation with contrast-enhanced chest CT. Considerations  include primary pulmonary malignancy or metastatic disease. Electronically Signed   By: Lovey Newcomer M.D.   On: 09/04/2017 18:23   Dg Chest 2 View  Result Date: 09/03/2017 CLINICAL DATA:  Diffuse pain. EXAM: CHEST - 2 VIEW COMPARISON:  PA and lateral chest 08/30/2017. CT chest, abdomen and pelvis 01/15/2017. FINDINGS: The lungs are markedly emphysematous but clear. Heart size is normal. No pneumothorax or pleural effusion. No acute or focal bony abnormality. IMPRESSION:  Severe emphysema without acute disease. Electronically Signed   By: Inge Rise M.D.   On: 09/03/2017 12:58    Procedures Procedures (including critical care time)  Medications Ordered in ED Medications  ipratropium-albuterol (DUONEB) 0.5-2.5 (3) MG/3ML nebulizer solution 3 mL (3 mLs Nebulization Refused 09/04/17 1842)  LORazepam (ATIVAN) tablet 1 mg (1 mg Oral Given 09/04/17 2031)     Initial Impression / Assessment and Plan / ED Course  I have reviewed the triage vital signs and the nursing notes.  Pertinent labs & imaging results that were available during my care of the patient were reviewed by me and considered in my medical decision making (see chart for details).     67 year old female who presents for evaluation of chronic lower abdominal pain.  Patient reports she is having some pain in bilateral lower extremities which she states is ongoing.  She states that she feels like "all of her nerves are going all around and causing her to be very anxious."  Patient has not had any nausea, vomiting, diarrhea.  She does have some cough but does have a history of COPD.  She denies any chest pain, difficult to breathing. Patient is afebrile, non-toxic appearing, sitting comfortably on examination table. Vital signs reviewed and stable.  On exam, patient is moving all extremities with any difficulty.  No evidence of neuro deficit.  She does have some mild wheezing noted but no evidence of respiratory distress.  Patient has some  generalized abdominal tenderness on initial exam but on distractibility, she has exhibits no signs of distress is lying fully and deeply palpate her abdomen.  Patient is well-known to the emergency department.  Suspect that this is consistent with patient's chronic pain.  Will plan to check basic labs, chest x-ray.  Given small dose of Ativan for anxiety.  Lipase unremarkable.  CMP shows glucose of 122.  Otherwise unremarkable.  CBC shows no leukocytosis.  Hemoglobin is 10.9 which is down slightly from 11 from yesterday.  UA shows no acute infectious etiology.  Urine drug screen is positive for benzos.  Chest x-ray shows no evidence of acute infectious etiology.  There is mention of a 3.4 cm mass projecting over the medial lower lung.  I discussed with radiologist, Dr. Rosana Hoes who confirms that this was seen on previous chest x-rays done on 09/03/2017 and 08/30/16.  He states that this is not new.  Discussed results with patient.  Repeat abdominal exam is benign.  No evidence of tenderness.  Patient states she feels slightly better after some Ativan.  Patient was recently discontinued on her benzodiazepine from her doctor.  She was requesting something for "her nerves."  I explained to her that will not send her home on any narcotics or any benzodiazepine.  She is currently on Suboxone.  Instructed patient to follow-up with her primary care doctor.  At this time, given exam, reassuring work-up, do not suspect any acute intra-abdominal etiology that would be causing patient's pain.  No indication for further CT abdomen pelvis imaging given reassuring work-up and exam. Patient had ample opportunity for questions and discussion. All patient's questions were answered with full understanding. Strict return precautions discussed. Patient expresses understanding and agreement to plan.    Final Clinical Impressions(s) / ED Diagnoses   Final diagnoses:  Anxiety    ED Discharge Orders    None       Volanda Napoleon, PA-C 09/05/17 0006    Blanchie Dessert, MD 09/05/17 [phone]

## 2017-09-04 NOTE — Discharge Instructions (Signed)
Follow up with your primary care doctor in the next 2-4 days.   Continue taking her Suboxone as directed.  Return to emergency department for any worsening abdominal pain, fever, nausea/vomiting chest pain, difficulty breathing or any other worsening or concerning symptoms.  As we discussed, there is a small nodule on your chest x-ray that needs to have evaluation by CT scan.  This can be done by your primary care doctor.

## 2017-09-04 NOTE — ED Triage Notes (Addendum)
Per EMS, patient c/o chronic lower abdominal pain radiating to right leg. Seen yesterday for same. Ambulatory with EMS. Denies N/V/D, chest pain, back pain, SOB, headache.

## 2017-09-08 ENCOUNTER — Other Ambulatory Visit: Payer: Self-pay | Admitting: Family Medicine

## 2017-09-08 DIAGNOSIS — J449 Chronic obstructive pulmonary disease, unspecified: Secondary | ICD-10-CM

## 2017-09-09 ENCOUNTER — Encounter: Payer: Self-pay | Admitting: Family Medicine

## 2017-09-09 ENCOUNTER — Ambulatory Visit: Payer: Medicaid Other | Admitting: Family Medicine

## 2017-09-09 VITALS — BP 126/84 | HR 110 | Temp 98.1°F | Ht 64.0 in | Wt <= 1120 oz

## 2017-09-09 DIAGNOSIS — J449 Chronic obstructive pulmonary disease, unspecified: Secondary | ICD-10-CM

## 2017-09-09 DIAGNOSIS — I10 Essential (primary) hypertension: Secondary | ICD-10-CM

## 2017-09-09 DIAGNOSIS — Z09 Encounter for follow-up examination after completed treatment for conditions other than malignant neoplasm: Secondary | ICD-10-CM

## 2017-09-09 DIAGNOSIS — M79604 Pain in right leg: Secondary | ICD-10-CM

## 2017-09-09 DIAGNOSIS — R911 Solitary pulmonary nodule: Secondary | ICD-10-CM

## 2017-09-09 DIAGNOSIS — F1129 Opioid dependence with unspecified opioid-induced disorder: Secondary | ICD-10-CM

## 2017-09-09 DIAGNOSIS — R0602 Shortness of breath: Secondary | ICD-10-CM

## 2017-09-09 DIAGNOSIS — G894 Chronic pain syndrome: Secondary | ICD-10-CM | POA: Diagnosis not present

## 2017-09-09 DIAGNOSIS — E43 Unspecified severe protein-calorie malnutrition: Secondary | ICD-10-CM

## 2017-09-09 DIAGNOSIS — M545 Low back pain: Secondary | ICD-10-CM

## 2017-09-09 NOTE — Progress Notes (Addendum)
Follow Up  Subjective:    Patient ID: [name], female    DOB: Aug 27, 1950, 67 y.o.   MRN: [id number]  Chief Complaint  Patient presents with  . Follow-up    1 month on chronic conditions    HPI  [name] is a 67 year old female with a past medical history of Substance Abuse, Seizures, Pancreatitis, Drug Seeking Behavior, COPD, and Renal Cell Carcinoma. She is here today for follow up.    Current Status: Since her last office visit, she continues to have generalized chronic pain issues. She states that her pain medication prescribed by pain management is not effective in treating her pain. She is currently taking Suboxone 8-2mg . Her anxiety remains increased, in which she continues to take Buspar as prescribed. She has discontinued taking Hydroxyzine, because she does not believe that it is helping her. She has has a significant decrease in weight over the past 4-5 months, r/t decrease in appetite. Her husband feels that she should get care in a rehab facility for assistance in regaining her strength and returning to a healthier state. Her husband has consulting with facility manager at [facility], and reports that they will not be able to assist her because there is no one who qualified to administer her Suboxone injections.     She reports frequent cough, dizziness and occasional shortness of breath, and she uses inhalers as prescribed. She denies fevers, chills,recent infections, and night sweats. She has not had any headaches, visual changes, and falls. No chest pain, heart palpitations reported. No reports of GI problems such as nausea, vomiting, diarrhea, and constipation. She has no reports of blood in stools, dysuria and hematuria.   Past Medical History:  Diagnosis Date  . Cancer ([place])    renal ca  . COPD (chronic obstructive pulmonary disease) ([place])   . Drug-seeking behavior   . Pancreatitis   . Pancreatitis   . Seizures ([place])   . Substance abuse ([place])      Family History  Problem Relation Age of Onset  . CAD Mother   . Alcoholism Father   . COPD Father     Social History   Socioeconomic History  . Marital status: Married    Spouse name: Not on file  . Number of children: Not on file  . Years of education: Not on file  . Highest education level: Not on file  Occupational History  . Not on file  Social Needs  . Financial resource strain: Not on file  . Food insecurity:    Worry: Not on file    Inability: Not on file  . Transportation needs:    Medical: Not on file    Non-medical: Not on file  Tobacco Use  . Smoking status: Current Every Day Smoker    Packs/day: 1.00    Years: 30.00    Pack years: 30.00    Types: Cigarettes  . Smokeless tobacco: Never Used  . Tobacco comment: smoking up to 1.5 ppd per husband.   Substance and Sexual Activity  . Alcohol use: Not Currently    Alcohol/week: 0.0 standard drinks  . Drug use: No    Comment: Hx of polysubstance drug abuse  . Sexual activity: Never  Lifestyle  . Physical activity:    Days per week: Not on file    Minutes per session: Not on file  . Stress: Not on file  Relationships  . Social connections:    Talks on phone: Not  on file    Gets together: Not on file    Attends religious service: Not on file    Active member of club or organization: Not on file    Attends meetings of clubs or organizations: Not on file    Relationship status: Not on file  . Intimate partner violence:    Fear of current or ex partner: Not on file    Emotionally abused: Not on file    Physically abused: Not on file    Forced sexual activity: Not on file  Other Topics Concern  . Not on file  Social History Narrative  . Not on file    Past Surgical History:  Procedure Laterality Date  . IR GENERIC HISTORICAL  05/08/2015   IR RADIOLOGIST EVAL & MGMT 05/08/2015 Aletta Edouard, MD [facility] INTERV RAD  . IR GENERIC HISTORICAL  01/08/2014   IR RADIOLOGIST EVAL & MGMT 01/08/2014 Aletta Edouard,  MD [facility] INTERV RAD  . KIDNEY SURGERY     removed cancerous lesions  . PARTIAL GASTRECTOMY      Immunization History  Administered Date(s) Administered  . Influenza Split 10/22/2013, 10/27/2014, 08/05/2015  . Influenza,inj,Quad PF,6+ Mos 09/21/2016  . Pneumococcal Conjugate-13 09/02/2014  . Pneumococcal Polysaccharide-23 02/11/2013    Current Meds  Medication Sig  . busPIRone (BUSPAR) 10 MG tablet Take 1 tablet (10 mg total) by mouth 3 (three) times daily.  . famotidine (PEPCID) 20 MG tablet Take 1 tablet (20 mg total) by mouth daily.  . ondansetron (ZOFRAN) 4 MG tablet Take 1 tablet (4 mg total) by mouth every 8 (eight) hours as needed for nausea or vomiting.  . polyethylene glycol powder (GLYCOLAX/MIRALAX) powder Mix 1 capful of powder into drink and take by mouth 1-3 times daily until daily soft stools OTC  . PROAIR HFA 108 (90 Base) MCG/ACT inhaler Inhale 90 Inhalers into the lungs every 4 (four) hours as needed for wheezing or shortness of breath.   . SPIRIVA HANDIHALER 18 MCG inhalation capsule PLACE 1 CAPSULE INTO INHALER AND INHALE DAILY  . SUBOXONE 8-2 MG FILM Take 1 Film by mouth daily.   . [DISCONTINUED] ondansetron (ZOFRAN ODT) 4 MG disintegrating tablet Take 1 tablet (4 mg total) by mouth every 8 (eight) hours as needed for nausea or vomiting.    Allergies  Allergen Reactions  . Aspirin Other (See Comments)    Break out in welts  . Chlorpromazine Hcl Other (See Comments)    Muscle spasms   BP 126/84 (BP Location: Right Arm, Patient Position: Sitting, Cuff Size: Small)   Pulse (!) 110   Temp 98.1 F (36.7 C) (Oral)   Ht 5\' 4"  (1.626 m)   Wt 67 lb (30.4 kg)   SpO2 90%   BMI 11.50 kg/m   Review of Systems  Constitutional: Positive for appetite change (decreased appetite).  HENT: Negative.   Eyes: Negative.   Respiratory: Positive for shortness of breath (Continuous 3 liters).   Cardiovascular: Negative.   Gastrointestinal: Negative.   Endocrine: Negative.    Genitourinary: Negative.   Musculoskeletal: Negative.   Skin: Negative.   Allergic/Immunologic: Negative.   Neurological: Positive for dizziness and weakness.  Hematological: Negative.   Psychiatric/Behavioral: Positive for agitation. The patient is nervous/anxious.     Objective:   Physical Exam  Constitutional: She appears well-developed and well-nourished.  HENT:  Head: Normocephalic.  Cardiovascular: Normal rate, regular rhythm, normal heart sounds and intact distal pulses.  Pulmonary/Chest: She has rales (lower lung lobes).  Accessory muscle  use with effort of breathing.   Nursing note and vitals reviewed.  Assessment & Plan:   1. Chronic obstructive pulmonary disease, unspecified COPD type ([place])  2. HTN (hypertension), malignant Blood pressure is elevated today. Patient is very anxious today. We will continue to monitor.  3. Chronic pain syndrome  4. Low back pain radiating to both legs  5. Opioid dependence with opioid-induced disorder ([place]) Followed by pain management. She will continue Suboxone as prescribed.   6. Severe malnutrition ([facility]) 20 lb weight loss in 3-4 months. She will continue supplemental nutrition such as Ensure at least 3-4 times a day. We will continue to monitor.   7. Shortness of breath Stable. Probable r/t COPD. Continue inhalers as prescribed.   8. Lung nodule She has appointment for consultation with Dr. Inda Merlin, Oncologist on 09/13/2017.    9. Follow up She will follow up in 2 months.   No orders of the defined types were placed in this encounter.   Kathe Becton,  MSN, FNP-C [facility] [address] [place], [place] [address] [phone]

## 2017-09-10 ENCOUNTER — Other Ambulatory Visit: Payer: Self-pay | Admitting: Family Medicine

## 2017-09-10 DIAGNOSIS — J449 Chronic obstructive pulmonary disease, unspecified: Secondary | ICD-10-CM

## 2017-09-12 ENCOUNTER — Other Ambulatory Visit: Payer: Self-pay | Admitting: *Deleted

## 2017-09-12 ENCOUNTER — Telehealth: Payer: Self-pay

## 2017-09-12 DIAGNOSIS — R918 Other nonspecific abnormal finding of lung field: Secondary | ICD-10-CM

## 2017-09-12 NOTE — Telephone Encounter (Signed)
Spoke with patient husband and he states that [facility] has decide to change medication to Oxycodone instead of Suboxone . Patient needs to have a FL2 form filled out and sent to [facility] to see if they are able to accept patient in the facility per patient husband Legrand Como.   [place] [phone]

## 2017-09-13 ENCOUNTER — Encounter: Payer: Self-pay | Admitting: Internal Medicine

## 2017-09-13 ENCOUNTER — Telehealth: Payer: Self-pay | Admitting: Internal Medicine

## 2017-09-13 ENCOUNTER — Inpatient Hospital Stay: Payer: Medicaid Other | Attending: Internal Medicine | Admitting: Internal Medicine

## 2017-09-13 ENCOUNTER — Inpatient Hospital Stay: Payer: Medicaid Other

## 2017-09-13 VITALS — BP 112/97 | HR 127 | Temp 97.8°F | Resp 20 | Ht 64.0 in | Wt <= 1120 oz

## 2017-09-13 DIAGNOSIS — C642 Malignant neoplasm of left kidney, except renal pelvis: Secondary | ICD-10-CM

## 2017-09-13 DIAGNOSIS — E46 Unspecified protein-calorie malnutrition: Secondary | ICD-10-CM | POA: Diagnosis not present

## 2017-09-13 DIAGNOSIS — Z85528 Personal history of other malignant neoplasm of kidney: Secondary | ICD-10-CM

## 2017-09-13 DIAGNOSIS — Z720 Tobacco use: Secondary | ICD-10-CM | POA: Diagnosis not present

## 2017-09-13 DIAGNOSIS — R911 Solitary pulmonary nodule: Secondary | ICD-10-CM

## 2017-09-13 DIAGNOSIS — R636 Underweight: Secondary | ICD-10-CM

## 2017-09-13 DIAGNOSIS — R918 Other nonspecific abnormal finding of lung field: Secondary | ICD-10-CM | POA: Diagnosis present

## 2017-09-13 DIAGNOSIS — E43 Unspecified severe protein-calorie malnutrition: Secondary | ICD-10-CM

## 2017-09-13 LAB — CMP (CANCER CENTER ONLY)
ALK PHOS: 123 U/L (ref 38–126)
ALT: 6 U/L (ref 0–44)
ANION GAP: 13 (ref 5–15)
AST: 10 U/L — ABNORMAL LOW (ref 15–41)
Albumin: 3.2 g/dL — ABNORMAL LOW (ref 3.5–5.0)
BUN: 12 mg/dL (ref 8–23)
CALCIUM: 10.4 mg/dL — AB (ref 8.9–10.3)
CO2: 25 mmol/L (ref 22–32)
CREATININE: 0.89 mg/dL (ref 0.44–1.00)
Chloride: 100 mmol/L (ref 98–111)
Glucose, Bld: 150 mg/dL — ABNORMAL HIGH (ref 70–99)
Potassium: 4.5 mmol/L (ref 3.5–5.1)
SODIUM: 138 mmol/L (ref 135–145)
Total Bilirubin: 0.5 mg/dL (ref 0.3–1.2)
Total Protein: 7.9 g/dL (ref 6.5–8.1)

## 2017-09-13 LAB — CBC WITH DIFFERENTIAL (CANCER CENTER ONLY)
Basophils Absolute: 0.1 10*3/uL (ref 0.0–0.1)
Basophils Relative: 0 %
EOS ABS: 0.2 10*3/uL (ref 0.0–0.5)
Eosinophils Relative: 1 %
HCT: 37.3 % (ref 34.8–46.6)
HEMOGLOBIN: 12.2 g/dL (ref 11.6–15.9)
LYMPHS ABS: 2.1 10*3/uL (ref 0.9–3.3)
LYMPHS PCT: 14 %
MCH: 28.8 pg (ref 25.1–34.0)
MCHC: 32.7 g/dL (ref 31.5–36.0)
MCV: 88 fL (ref 79.5–101.0)
MONOS PCT: 7 %
Monocytes Absolute: 1 10*3/uL — ABNORMAL HIGH (ref 0.1–0.9)
NEUTROS PCT: 78 %
Neutro Abs: 11.6 10*3/uL — ABNORMAL HIGH (ref 1.5–6.5)
Platelet Count: 393 10*3/uL (ref 145–400)
RBC: 4.24 MIL/uL (ref 3.70–5.45)
RDW: 14.7 % — ABNORMAL HIGH (ref 11.2–14.5)
WBC Count: 15 10*3/uL — ABNORMAL HIGH (ref 3.9–10.3)

## 2017-09-13 NOTE — Telephone Encounter (Signed)
Gave pt avs and calendar  °

## 2017-09-13 NOTE — Progress Notes (Signed)
[place] [phone]   [phone]  CONSULT NOTE  REFERRING PHYSICIAN: Kathe Becton, FNP  REASON FOR CONSULTATION:  67 years old white female with left lung mass  HPI [name] is a 67 y.o. 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  The patient has been doing fine since that time with no clear evidence for recurrence.  She was followed by pain clinic by Dr. Alyson Ingles.  Is also seen by Kathe Becton, FNP.  She presented to the emergency department recently complaining of pain all over her body and seeking pain medications. During her evaluation she had CT scan of the abdomen and pelvis performed on 08/22/2017 and that showed incidental finding of 2.5 cm left lower lobe pulmonary nodule.  Recent chest x-ray on 09/04/2017 showed 3.4 cm mass projecting over the medial left lower lung. The patient was referred to me today for evaluation and recommendation regarding this finding.  When seen today she continues to complain of pain all over her body.  She is still followed by a pain clinic currently on treatment with Suboxone.  The patient has mild cough and shortness of breath as well as intermittent headache but no significant visual changes.  She did not have any recent weight loss.  She has no nausea, vomiting, diarrhea or constipation.  She has no fever or chills. Family history significant for mother with coronary artery disease, father had COPD and alcohol abuse. The patient is married and has 2 sons.  She was accompanied by her husband [name].  She has a history of smoking up to 3 back spent a day but now smoking only 1 pack/day.  She has been smoking for more than 45 years.  She has  no history of alcohol but she has drug-seeking behavior. HPI  Past Medical History:  Diagnosis Date  . Cancer ([place])    renal ca  . COPD (chronic obstructive pulmonary disease) ([place])   . Drug-seeking behavior   . Pancreatitis   . Pancreatitis   . Seizures ([place])   . Substance abuse [facility])     Past Surgical History:  Procedure Laterality Date  . IR GENERIC HISTORICAL  05/08/2015   IR RADIOLOGIST EVAL & MGMT 05/08/2015 Aletta Edouard, MD [facility] INTERV RAD  . IR GENERIC HISTORICAL  01/08/2014   IR RADIOLOGIST EVAL & MGMT 01/08/2014 Aletta Edouard, MD [facility] INTERV RAD  . KIDNEY SURGERY     removed cancerous lesions  . PARTIAL GASTRECTOMY      Family History  Problem Relation Age of Onset  . CAD Mother   . Alcoholism Father   . COPD Father     Social History Social History   Tobacco Use  . Smoking status: Current Every Day Smoker    Packs/day: 1.00    Years: 30.00    Pack years: 30.00    Types: Cigarettes  . Smokeless tobacco: Never Used  . Tobacco comment: smoking up to 1.5 ppd per husband.   Substance Use Topics  . Alcohol use: Not Currently    Alcohol/week: 0.0 standard drinks  . Drug use: No    Comment: Hx of polysubstance drug abuse    Allergies  Allergen Reactions  . Aspirin Other (See Comments)  Break out in welts  . Chlorpromazine Hcl Other (See Comments)    Muscle spasms    Current Outpatient Medications  Medication Sig Dispense Refill  . busPIRone (BUSPAR) 10 MG tablet Take 1 tablet (10 mg total) by mouth 3 (three) times daily. 90 tablet 2  . famotidine (PEPCID) 20 MG tablet Take 1 tablet (20 mg total) by mouth daily. 30 tablet 0  . hydrOXYzine (ATARAX/VISTARIL) 25 MG tablet Take 1 tablet (25 mg total) by mouth 3 (three) times daily as needed (anxiety). (Patient not taking: Reported on 09/09/2017) 90 tablet 2  . mometasone-formoterol (DULERA) 200-5 MCG/ACT AERO Inhale 2 puffs into the lungs 2 (two) times daily. 0.1 g 0  . ondansetron (ZOFRAN) 4 MG tablet  Take 1 tablet (4 mg total) by mouth every 8 (eight) hours as needed for nausea or vomiting. 20 tablet 6  . polyethylene glycol powder (GLYCOLAX/MIRALAX) powder Mix 1 capful of powder into drink and take by mouth 1-3 times daily until daily soft stools OTC 225 g 0  . PROAIR HFA 108 (90 Base) MCG/ACT inhaler Inhale 90 Inhalers into the lungs every 4 (four) hours as needed for wheezing or shortness of breath.   5  . SPIRIVA HANDIHALER 18 MCG inhalation capsule PLACE 1 CAPSULE INTO INHALER AND INHALE DAILY 30 capsule 0  . SUBOXONE 8-2 MG FILM Take 1 Film by mouth daily.   0   No current facility-administered medications for this visit.     Review of Systems  Constitutional: positive for anorexia and fatigue Eyes: negative Ears, nose, mouth, throat, and face: negative Respiratory: positive for cough, dyspnea on exertion and pleurisy/chest pain Cardiovascular: negative Gastrointestinal: negative Genitourinary:negative Integument/breast: negative Hematologic/lymphatic: negative Musculoskeletal:positive for arthralgias and bone pain Neurological: negative Behavioral/Psych: negative Endocrine: negative Allergic/Immunologic: negative  Physical Exam  GYI:RSWNI, healthy, cachectic and malnourished SKIN: skin color, texture, turgor are normal HEAD: Normocephalic, No masses, lesions, tenderness or abnormalities EYES: normal, PERRLA EARS: External ears normal OROPHARYNX:no exudate and no erythema  NECK: supple, no adenopathy LYMPH:  no palpable lymphadenopathy, no hepatosplenomegaly BREAST:not examined LUNGS: clear to auscultation , and palpation HEART: regular rate & rhythm, no murmurs and no gallops ABDOMEN:abdomen soft, non-tender, normal bowel sounds and no masses or organomegaly BACK: No CVA tenderness, Range of motion is normal EXTREMITIES:no joint deformities, effusion, or inflammation, no edema  NEURO: alert & oriented x 3 with fluent speech, no focal motor/sensory  deficits  PERFORMANCE STATUS: ECOG 1  LABORATORY DATA: Lab Results  Component Value Date   WBC 15.0 (H) 09/13/2017   HGB 12.2 09/13/2017   HCT 37.3 09/13/2017   MCV 88.0 09/13/2017   PLT 393 09/13/2017      Chemistry      Component Value Date/Time   NA 140 09/04/2017 1840   K 3.5 09/04/2017 1840   CL 105 09/04/2017 1840   CO2 27 09/04/2017 1840   BUN 10 09/04/2017 1840   CREATININE 0.85 09/04/2017 1840      Component Value Date/Time   CALCIUM 8.7 (L) 09/04/2017 1840   ALKPHOS 82 09/04/2017 1840   AST 16 09/04/2017 1840   ALT 10 09/04/2017 1840   BILITOT 0.4 09/04/2017 1840       RADIOGRAPHIC STUDIES: Dg Chest 2 View  Result Date: 09/04/2017 CLINICAL DATA:  Patient with productive cough EXAM: CHEST - 2 VIEW COMPARISON:  Chest radiograph 09/03/2017 FINDINGS: Stable cardiac and mediastinal contours. Re-demonstrated 3.4 cm mass projecting over the posterior left lower lung. Pulmonary hyperinflation. Emphysematous change. No  large area pulmonary consolidation. No pleural effusion or pneumothorax. Thoracic spine degenerative changes. IMPRESSION: There is a 3.4 cm mass projecting over the medial left lower lung. This needs dedicated evaluation with contrast-enhanced chest CT. Considerations include primary pulmonary malignancy or metastatic disease. Electronically Signed   By: Lovey Newcomer M.D.   On: 09/04/2017 18:23   Dg Chest 2 View  Result Date: 09/03/2017 CLINICAL DATA:  Diffuse pain. EXAM: CHEST - 2 VIEW COMPARISON:  PA and lateral chest 08/30/2017. CT chest, abdomen and pelvis 01/15/2017. FINDINGS: The lungs are markedly emphysematous but clear. Heart size is normal. No pneumothorax or pleural effusion. No acute or focal bony abnormality. IMPRESSION: Severe emphysema without acute disease. Electronically Signed   By: Inge Rise M.D.   On: 09/03/2017 12:58   Dg Chest 2 View  Result Date: 08/30/2017 CLINICAL DATA:  Cough and COPD EXAM: CHEST - 2 VIEW COMPARISON:   08/28/2017 FINDINGS: The lungs are hyperinflated with diffuse interstitial prominence. No focal airspace consolidation or pulmonary edema. No pleural effusion or pneumothorax. Normal cardiomediastinal contours. Unchanged appearance of left paraspinous mass. IMPRESSION: 1. COPD without focal airspace disease. 2. Unchanged appearance of left paramedian mass. Electronically Signed   By: Ulyses Jarred M.D.   On: 08/30/2017 18:53   Ct Abdomen Pelvis W Contrast  Result Date: 08/22/2017 CLINICAL DATA:  Acute abdominal pain. Patient reports back and leg pain. Decreased appetite. EXAM: CT ABDOMEN AND PELVIS WITH CONTRAST TECHNIQUE: Multidetector CT imaging of the abdomen and pelvis was performed using the standard protocol following bolus administration of intravenous contrast. CONTRAST:  42mL OMNIPAQUE IOHEXOL 300 MG/ML  SOLN COMPARISON:  Chest, abdomen, and pelvis CT 01/15/2017, 02/05/2017, prior chest radiographs FINDINGS: Lower chest: Pulmonary nodule with central low density in the left lower lobe measures 2.5 cm, only partially included in the field of view. This abuts the pleura posteriorly. This likely represents increase size of a 3 mm nodule on prior exam. Hepatobiliary: Unchanged intra and extrahepatic biliary ductal dilatation, common bile duct measuring approximately 15 mm. A calcified gallstone or visualized choledocholithiasis. Gallbladder is partially distended. No focal hepatic lesion. Pancreas: Atrophic with parenchyma not well visualized. No peripancreatic inflammation. Spleen: Normal in size without focal abnormality. Adrenals/Urinary Tract: No adrenal nodule. Stable appearance of post ablation defect in the lower left kidney with dystrophic calcification. No hydronephrosis. No perinephric edema. No new renal lesion allowing for mild motion artifact. Urinary bladder is physiologically distended, no bladder wall thickening. Stomach/Bowel: Paucity of intra-abdominal fat limits detailed bowel evaluation.  Small hiatal hernia. Fluid in the stomach with equivocal gastric wall thickening. Surgical clips along the lesser curvature of stomach. No bowel obstruction, evidence of wall thickening or inflammatory change. Appendix not visualized. Vascular/Lymphatic: Advanced aortic and branch atherosclerosis. Atherosclerotic calcification of the origin of both renal arteries. No acute vascular findings. No bulky adenopathy, evaluation for adenopathy is limited due to paucity of intra-abdominal fat. Reproductive: Uterus not definitively visualized.  No adnexal mass. Other: No free air or free fluid. Subcutaneous calcifications in the left greater than right flank, unchanged and incidental. Musculoskeletal: Bones are under mineralized. Degenerative disc disease throughout the lumbar spine. No blastic osseous lesion. Evaluation for lytic lesions limited given underlying bony under mineralization. Bilateral hip pinning. IMPRESSION: 1. Fluid in the stomach with possible gastric enhancement, may represent gastritis. 2. Partially included 2.5 cm left lower lobe pulmonary nodule, retrospectively increasing since CT 6 months prior were is faintly visualized. This may represent metastasis given history of renal cancer versus primary bronchogenic  malignancy. Recommend complete chest evaluation with dedicated chest CT, this could be performed on a nonemergent basis based on patient's symptoms. 3. Chronic biliary dilatation, unchanged from prior exams. 4.  Aortic Atherosclerosis (ICD10-I70.0). Electronically Signed   By: Jeb Levering M.D.   On: 08/22/2017 22:26   Dg Abdomen Acute W/chest  Result Date: 08/28/2017 CLINICAL DATA:  Epigastric pain. EXAM: DG ABDOMEN ACUTE W/ 1V CHEST COMPARISON:  CT of the abdomen and pelvis 08/22/2017 FINDINGS: The heart size is normal. Atherosclerotic calcifications are present at the aortic arch. Emphysematous changes are noted. The left paraspinous 2.5 cm mass is again noted. Bowel gas pattern is  normal. There is no free air. No obstruction is present. Postsurgical changes are present at both hips. IMPRESSION: 1. No acute cardiopulmonary disease. 2. Left paraspinous mass. 3. Normal bowel gas pattern. No acute or focal lesion to explain epigastric pain. Electronically Signed   By: San Morelle M.D.   On: 08/28/2017 15:37   Ct Renal Stone Study  Result Date: 08/30/2017 CLINICAL DATA:  Recurring urinary tract infection. History of substance abuse, pancreatitis, renal cell carcinoma. EXAM: CT ABDOMEN AND PELVIS WITHOUT CONTRAST TECHNIQUE: Multidetector CT imaging of the abdomen and pelvis was performed following the standard protocol without IV contrast. COMPARISON:  CT abdomen and pelvis August 22, 2017 FINDINGS: Moderate respiratory motion degraded examination. LOWER CHEST: Lung bases are clear. The visualized heart size is normal. Small pericardial effusion. HEPATOBILIARY: Negative liver. Similar mild biliary dilatation better demonstrated on recent contrast-enhanced CT. No gallbladder distension or definite cholelithiasis though limited by motion. PANCREAS: Normal. SPLEEN: Punctate splenic granuloma. ADRENALS/URINARY TRACT: Kidneys are orthotopic, demonstrating normal size and morphology. Partially calcified 13 mm LEFT lower pole mass at site of prior ablation. No nephrolithiasis, hydronephrosis; limited assessment for renal masses by nonenhanced CT. The unopacified ureters are normal in course and caliber. Urinary bladder is partially distended and unremarkable. Normal adrenal glands. STOMACH/BOWEL: The stomach, small and large bowel are normal in course and caliber without inflammatory changes, sensitivity decreased by lack of enteric contrast. Surgical suture material at gastric antrum. Mild stool distended rectum with moderate amount of retained large bowel stool. Normal appendix. VASCULAR/LYMPHATIC: Aortoiliac vessels are normal in course and caliber. Severe calcific atherosclerosis. No  lymphadenopathy by CT size criteria. REPRODUCTIVE: Status post hysterectomy. OTHER: No intraperitoneal free fluid or free air. MUSCULOSKELETAL: Non-acute. Streak artifact from bilateral femoral neck pinning. Cachexia. Osteopenia. Grade 1 L2-3 retrolisthesis. IMPRESSION: 1. Moderately motion degraded examination. 2. No urolithiasis or obstructive uropathy. Post treatment changes LEFT kidney. 3. Moderate amount of retained large bowel stool without bowel obstruction. Aortic Atherosclerosis (ICD10-I70.0). Electronically Signed   By: Elon Alas M.D.   On: 08/30/2017 19:08    ASSESSMENT: This is a very pleasant 67 years old malnourished white female with history of renal cell carcinoma diagnosed in 2009 status post radiofrequency ablation as well as cryoablation last treatment was in 2012.  She is presenting with left lower lobe lung mass suspicious for metastatic disease from renal cell carcinoma versus primary lung malignancy.   PLAN: I personally and independently reviewed the scan images and discussed the result and showed the images to the patient and her husband today.  I recommended for the patient to complete the staging work-up by ordering a PET scan for further evaluation of her disease and to rule out any metastatic lesions. I will arrange for the patient to come back for follow-up visit after the PET scan for discussion of the results and also to  decide on the best lesion for biopsy for confirmation of her tissue diagnosis. For pain management and drug seeking behavior, the patient will continue her routine follow-up visit and evaluation by the pain clinic and her primary care physician.  She understands that I will not be able to prescribe any pain medication for her. For the malnutrition, I strongly encouraged the patient to increase her oral intake and I would be happy to refer her to a dietitian at the cancer center for evaluation if needed. I also strongly encouraged the patient to quit  smoking. She was advised to call if she has any concerning symptoms in the interval.   The patient voices understanding of current disease status and treatment options and is in agreement with the current care plan.  All questions were answered. The patient knows to call the clinic with any problems, questions or concerns. We can certainly see the patient much sooner if necessary.  Thank you so much for allowing me to participate in the care of [name]. I will continue to follow up the patient with you and assist in her care.  I spent 40 minutes counseling the patient face to face. The total time spent in the appointment was 60 minutes.  Disclaimer: This note was dictated with voice recognition software. Similar sounding words can inadvertently be transcribed and may not be corrected upon review.   Eilleen Kempf September 13, 2017, 2:42 PM

## 2017-09-15 ENCOUNTER — Emergency Department (HOSPITAL_COMMUNITY)
Admission: EM | Admit: 2017-09-15 | Discharge: 2017-09-15 | Disposition: A | Discharge status: Home / Self Care | Payer: Medicaid Other | Attending: Emergency Medicine | Admitting: Emergency Medicine

## 2017-09-15 ENCOUNTER — Encounter (HOSPITAL_COMMUNITY): Payer: Self-pay | Admitting: *Deleted

## 2017-09-15 ENCOUNTER — Emergency Department (HOSPITAL_COMMUNITY): Payer: Medicaid Other

## 2017-09-15 ENCOUNTER — Other Ambulatory Visit: Payer: Self-pay

## 2017-09-15 DIAGNOSIS — J449 Chronic obstructive pulmonary disease, unspecified: Secondary | ICD-10-CM | POA: Insufficient documentation

## 2017-09-15 DIAGNOSIS — Z79899 Other long term (current) drug therapy: Secondary | ICD-10-CM | POA: Diagnosis not present

## 2017-09-15 DIAGNOSIS — K5900 Constipation, unspecified: Secondary | ICD-10-CM | POA: Insufficient documentation

## 2017-09-15 DIAGNOSIS — I10 Essential (primary) hypertension: Secondary | ICD-10-CM | POA: Insufficient documentation

## 2017-09-15 DIAGNOSIS — R339 Retention of urine, unspecified: Secondary | ICD-10-CM

## 2017-09-15 DIAGNOSIS — F17210 Nicotine dependence, cigarettes, uncomplicated: Secondary | ICD-10-CM | POA: Insufficient documentation

## 2017-09-15 LAB — CBC WITH DIFFERENTIAL/PLATELET
BASOS PCT: 0 %
Basophils Absolute: 0.1 10*3/uL (ref 0.0–0.1)
EOS ABS: 0.1 10*3/uL (ref 0.0–0.7)
Eosinophils Relative: 1 %
HCT: 38.4 % (ref 36.0–46.0)
HEMOGLOBIN: 12.7 g/dL (ref 12.0–15.0)
LYMPHS ABS: 1.6 10*3/uL (ref 0.7–4.0)
Lymphocytes Relative: 13 %
MCH: 28.9 pg (ref 26.0–34.0)
MCHC: 33.1 g/dL (ref 30.0–36.0)
MCV: 87.5 fL (ref 78.0–100.0)
Monocytes Absolute: 0.7 10*3/uL (ref 0.1–1.0)
Monocytes Relative: 6 %
NEUTROS ABS: 9.6 10*3/uL — AB (ref 1.7–7.7)
NEUTROS PCT: 80 %
Platelets: 499 10*3/uL — ABNORMAL HIGH (ref 150–400)
RBC: 4.39 MIL/uL (ref 3.87–5.11)
RDW: 14.7 % (ref 11.5–15.5)
WBC: 12 10*3/uL — ABNORMAL HIGH (ref 4.0–10.5)

## 2017-09-15 LAB — URINALYSIS, ROUTINE W REFLEX MICROSCOPIC
BILIRUBIN URINE: NEGATIVE
Glucose, UA: NEGATIVE mg/dL
KETONES UR: NEGATIVE mg/dL
LEUKOCYTES UA: NEGATIVE
Nitrite: NEGATIVE
PH: 6 (ref 5.0–8.0)
Protein, ur: 30 mg/dL — AB
SPECIFIC GRAVITY, URINE: 1.019 (ref 1.005–1.030)

## 2017-09-15 LAB — BASIC METABOLIC PANEL
ANION GAP: 11 (ref 5–15)
BUN: 19 mg/dL (ref 8–23)
CHLORIDE: 102 mmol/L (ref 98–111)
CO2: 28 mmol/L (ref 22–32)
CREATININE: 0.9 mg/dL (ref 0.44–1.00)
Calcium: 10 mg/dL (ref 8.9–10.3)
GFR calc non Af Amer: >60 mL/min (ref >60)
Glucose, Bld: 102 mg/dL — ABNORMAL HIGH (ref 70–99)
Potassium: 4.3 mmol/L (ref 3.5–5.1)
Sodium: 141 mmol/L (ref 135–145)

## 2017-09-15 MED ORDER — POLYETHYLENE GLYCOL 3350 17 G PO PACK
17.0000 g | PACK | Freq: Every day | ORAL | Status: DC
  Administered 2017-09-15: 17 g via ORAL
  Filled 2017-09-15: qty 1

## 2017-09-15 MED ORDER — FENTANYL CITRATE (PF) 100 MCG/2ML IJ SOLN
50.0000 ug | Freq: Once | INTRAMUSCULAR | Status: AC
  Administered 2017-09-15: 50 ug via INTRAVENOUS
  Filled 2017-09-15: qty 2

## 2017-09-15 MED ORDER — LORAZEPAM 2 MG/ML IJ SOLN
0.5000 mg | Freq: Once | INTRAMUSCULAR | Status: AC
  Administered 2017-09-15: 0.5 mg via INTRAVENOUS
  Filled 2017-09-15: qty 1

## 2017-09-15 NOTE — ED Notes (Signed)
Bed: DO72 Expected date:  Expected time:  Means of arrival:  Comments: Pt in room

## 2017-09-15 NOTE — Discharge Instructions (Addendum)
It appears that you are in urinary retention because of your constipation.  Use MiraLAX 2 or 3 times a day until you have daily soft bowel movements.  Make sure that you are drinking 1 to 2 L of water every day.  Also try to eat 3 meals a day with high-protein and high-calorie content.  It is important for you to try to gain some weight, as you work with your oncologist, to evaluate and treat the lung mass.

## 2017-09-15 NOTE — ED Provider Notes (Signed)
[facility] DEPT Provider Note   CSN: [id number] Arrival date & time: 09/15/17  1404     History   Chief Complaint Chief Complaint  Patient presents with  . Urinary Retention  . Shortness of Breath  . Generalized Body Aches    HPI [name] is a 67 y.o. female.  HPI  Patient here complaining of unable to void since earlier this morning and feels like she has to go.  She states that she did not take her Suboxone this morning because her husband has her medications.  Patient states "he will have me around much longer," apparently as a statement about her overall medical health which is very poor at this time.  She saw her oncologist, 2 days ago at that time was scheduled for PET scanning to evaluate a pulmonary lesion for metastatic cancer.  She is unable to give additional history.  Level 5 caveat-altered mental status   Past Medical History:  Diagnosis Date  . Cancer ([place])    renal ca  . COPD (chronic obstructive pulmonary disease) ([place])   . Drug-seeking behavior   . Pancreatitis   . Pancreatitis   . Seizures ([place])   . Substance abuse [facility])     Patient Active Problem List   Diagnosis Date Noted  . Osteoarthritis of facet joint of lumbar spine 07/26/2017  . Low back pain radiating to both legs 07/26/2017  . 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 Failure to thrive in adult 06/21/2017  . Pressure injury of skin 04/06/2017  . Pressure injury of skin of right ischial tuberosity region: Stage 2 04/06/2017  . Acute urinary retention 04/04/2017  . Benzodiazepine dependence ([place])   . AKI (acute kidney injury) ([place]) 04/03/2017  . Hip fracture, right ([place]) 06/02/2016  . Narcotic addiction ([place]) 02/29/2016  . Abdominal pain, chronic, epigastric- due to chronic pancreatitis 02/29/2016  . COPD (chronic obstructive pulmonary disease) ([place]) 02/28/2016  . Cough 09/03/2015  . Abdominal pain 01/03/2015  .  Hyponatremia 01/03/2015  . Hypocalcemia 01/03/2015  . Underweight 01/03/2015  . COPD GOLD III with reversible component  07/17/2014  . DTs (delirium tremens) ([place]) 06/02/2013  . Chronic alcoholic pancreatitis ([place]) 06/02/2013  . Lactic acidosis 06/02/2013  . Recurrent acute pancreatitis 03/28/2013  . Alcohol withdrawal ([place]) 03/28/2013  . Pancreatitis 03/09/2013  . Tobacco abuse 03/09/2013  . Alcohol withdrawal syndrome with perceptual disturbance ([place]) 03/09/2013  . Acute alcoholic pancreatitis 63/84/6659  . Benzodiazepine withdrawal ([place]) 02/09/2013  . Protein-calorie malnutrition, severe ([place]) 02/09/2013  . Hypokalemia 02/27/2011  . Abdominal pain, acute 02/25/2011  . Nausea and vomiting 02/25/2011  . Thrombocytopenia ([place]) 02/25/2011  . COPD with acute exacerbation ([place]) 02/25/2011  . HTN (hypertension), malignant 02/25/2011  . 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 Anxiety state 10/18/2008  . GERD 10/18/2008  . PANCREATITIS, CHRONIC- atrophic pancreas 10/18/2008  . DYSPNEA 10/18/2008  . GASTRIC ULCER, HX OF 10/18/2008    Past Surgical History:  Procedure Laterality Date  . IR GENERIC HISTORICAL  05/08/2015   IR RADIOLOGIST EVAL & MGMT 05/08/2015 Aletta Edouard, MD [facility] INTERV RAD  . IR GENERIC HISTORICAL  01/08/2014   IR RADIOLOGIST EVAL & MGMT 01/08/2014 Aletta Edouard, MD [facility] INTERV RAD  . KIDNEY SURGERY     removed cancerous lesions  . PARTIAL GASTRECTOMY       OB History   None      Home Medications    Prior to Admission  medications   Medication Sig Start Date End Date Taking? Authorizing Provider  famotidine (PEPCID) 20 MG tablet Take 1 tablet (20 mg total) by mouth daily. Patient taking differently: Take 20 mg by mouth daily as needed for heartburn or indigestion.  08/28/17  Yes Khatri, Hina, PA-C  mometasone-formoterol (DULERA) 200-5 MCG/ACT AERO Inhale 2 puffs into the lungs 2 (two) times daily. 08/08/17  Yes Azzie Glatter, FNP  ondansetron (ZOFRAN) 4 MG tablet Take 1 tablet (4 mg total) by mouth every 8 (eight) hours as needed for nausea or vomiting. 08/23/17  Yes Azzie Glatter, FNP  polyethylene glycol powder (GLYCOLAX/MIRALAX) powder Mix 1 capful of powder into drink and take by mouth 1-3 times daily until daily soft stools OTC 08/30/17  Yes Little, Wenda Overland, MD  PROAIR HFA [phone] Base) MCG/ACT inhaler Inhale 1 puff into the lungs every 4 (four) hours as needed for wheezing or shortness of breath.  08/02/17  Yes [provider]  SPIRIVA HANDIHALER 18 MCG inhalation capsule PLACE 1 CAPSULE INTO INHALER AND INHALE DAILY Patient taking differently: Place 18 mcg into inhaler and inhale daily as needed (shortness of breath).  09/08/17  Yes Azzie Glatter, FNP  busPIRone (BUSPAR) 10 MG tablet Take 1 tablet (10 mg total) by mouth 3 (three) times daily. Patient not taking: Reported on 09/15/2017 08/23/17   Azzie Glatter, FNP  hydrOXYzine (ATARAX/VISTARIL) 25 MG tablet Take 1 tablet (25 mg total) by mouth 3 (three) times daily as needed (anxiety). Patient not taking: Reported on 09/15/2017 08/08/17   Azzie Glatter, FNP    Family History Family History  Problem Relation Age of Onset  . CAD Mother   . Alcoholism Father   . COPD Father     Social History Social History   Tobacco Use  . Smoking status: Current Every Day Smoker    Packs/day: 1.50    Years: 30.00    Pack years: 45.00    Types: Cigarettes  . Smokeless tobacco: Never Used  . Tobacco comment: smoking up to 1.5 ppd per husband. down from 3pk day to 1.5   Substance Use Topics  . Alcohol use: Not Currently    Alcohol/week: 0.0 standard drinks    Comment: "I stopped drinking 1 year ago"  . Drug use: No    Comment: Hx of polysubstance drug abuse     Allergies   Aspirin and Chlorpromazine hcl   Review of Systems Review of Systems  Unable to perform ROS: Mental status change     Physical Exam Updated Vital Signs BP  (!) 142/104   Pulse (!) 108   Temp 99 F (37.2 C) (Oral)   Resp 19   Ht 5\' 4"  (1.626 m)   Wt 30.4 kg   SpO2 98%   BMI 11.50 kg/m   Physical Exam  Constitutional: She appears well-developed. She does not appear ill. She appears distressed (Uncomfortable, crying, writhing.).  Severely malnourished, appears older than stated age.  HENT:  Head: Normocephalic and atraumatic.  Eyes: Pupils are equal, round, and reactive to light. Conjunctivae and EOM are normal.  Neck: Normal range of motion and phonation normal. Neck supple.  Cardiovascular: Normal rate and regular rhythm.  Pulmonary/Chest: Effort normal and breath sounds normal. She exhibits no tenderness.  Abdominal: Soft. She exhibits no distension. There is no tenderness. There is no guarding.  Musculoskeletal: Normal range of motion.  Neurological: She is alert. She exhibits normal muscle tone.  Skin: Skin is warm  and dry. No erythema.  Psychiatric:  Anxious, tearful.  Nursing note and vitals reviewed.    ED Treatments / Results  Labs (all labs ordered are listed, but only abnormal results are displayed) Labs Reviewed  URINALYSIS, ROUTINE W REFLEX MICROSCOPIC - Abnormal; Notable for the following components:      Result Value   Hgb urine dipstick SMALL (*)    Protein, ur 30 (*)    Bacteria, UA RARE (*)    All other components within normal limits  BASIC METABOLIC PANEL - Abnormal; Notable for the following components:   Glucose, Bld 102 (*)    All other components within normal limits  CBC WITH DIFFERENTIAL/PLATELET - Abnormal; Notable for the following components:   WBC 12.0 (*)    Platelets 499 (*)    Neutro Abs 9.6 (*)    All other components within normal limits    EKG EKG Interpretation  Date/Time:  Thursday September 15 2017 14:38:41 EDT Ventricular Rate:  119 PR Interval:    QRS Duration: 69 QT Interval:  314 QTC Calculation: 442 R Axis:   80 Text Interpretation:  Sinus tachycardia LAE, consider  biatrial enlargement Probable left ventricular hypertrophy Nonspecific T abnormalities, lateral leads Baseline wander in lead(s) V3 Since last tracing rate faster Confirmed by Daleen Bo [phone]) on 09/15/2017 4:44:12 PM    Radiology Dg Chest 2 View  Result Date: 09/15/2017 CLINICAL DATA:  Pt reports being unable to urinate since last night. Pt hx of COPD and normally on 4L. Pt is SOB and O2 sat is 86%. Smoker. EXAM: CHEST - 2 VIEW COMPARISON:  09/04/2017 FINDINGS: Lungs are markedly hyperinflated. The heart size is normal. In the LEFT LOWER lobe, oval mass is estimated to measure 4.1 x 3.0 centimeters, larger compared to prior studies. There is patchy opacity in the RIGHT lung base, consistent with infectious abnormality. No pulmonary edema. IMPRESSION: Increasing size of LEFT LOWER lobe mass. RIGHT LOWER lobe infiltrate. Electronically Signed   By: Nolon Nations M.D.   On: 09/15/2017 16:30    Procedures Procedures (including critical care time)  Medications Ordered in ED Medications  polyethylene glycol (MIRALAX / GLYCOLAX) packet 17 g (17 g Oral Given 09/15/17 1816)  fentaNYL (SUBLIMAZE) injection 50 mcg (50 mcg Intravenous Given 09/15/17 1541)  LORazepam (ATIVAN) injection 0.5 mg (0.5 mg Intravenous Given 09/15/17 1541)  fentaNYL (SUBLIMAZE) injection 50 mcg (50 mcg Intravenous Given 09/15/17 1816)     Initial Impression / Assessment and Plan / ED Course  I have reviewed the triage vital signs and the nursing notes.  Pertinent labs & imaging results that were available during my care of the patient were reviewed by me and considered in my medical decision making (see chart for details).  Clinical Course as of Sep 15 1957  Thu Sep 15, 2017  1645 Normal except hemoglobin and protein positive, RBCs elevated and rare bacteria present.  Note that this was a in and out catheter sample.  Urinalysis, Routine w reflex microscopic- may I&O cath if menses(!) [EW]  1645 Urinary bladder was  drained of 350 cc of yellow urine, by nursing, to treat patient's symptoms of a urinary retention.   [EW]  6045 Essentially normal.  Basic metabolic panel(!) [EW]  4098 Normal except white count high, platelets high, neutrophils elevated  CBC with Differential(!) [EW]  1648 Left lower lobe mass is slightly larger than 11 days ago.  Possible infectious right lower lobe infiltrate.  Images reviewed by me.   [EW]  1654 Review of notes indicate that on 09/12/2017, her pain management doctor decided to change her Suboxone to oxycodone.   [EW]    Clinical Course User Index [EW] Daleen Bo, MD     Patient Vitals for the past 24 hrs:  BP Temp Temp src Pulse Resp SpO2 Height Weight  09/15/17 1830 (!) 142/104 - - (!) 108 19 98 % - -  09/15/17 1801 (!) 140/106 - - 97 13 98 % - -  09/15/17 1730 127/84 - - 92 18 98 % - -  09/15/17 1700 (!) 141/105 - - (!) 136 (!) 22 96 % - -  09/15/17 1630 (!) 133/104 - - 94 (!) 21 100 % - -  09/15/17 1532 115/77 - - (!) 40 (!) 22 (!) 89 % - -  09/15/17 1446 - - - - - 100 % - -  09/15/17 1422 (!) 132/97 99 F (37.2 C) Oral (!) 103 18 (!) 84 % 5\' 4"  (1.626 m) 30.4 kg    4:57 PM Reevaluation with update and discussion. After initial assessment and treatment, an updated evaluation reveals she is much more comfortable now and is able to give additional history that she has not eaten much in several days and only had one bowel movement in the last 2 weeks.  She has not been taking MiraLAX regularly as recommended on prior evaluation here couple weeks ago.  At this time she is calm and comfortable, states she needs "a little bit more for pain," and is willing to try some MiraLAX. Daleen Bo   Medical Decision Making: Urinary retention likely secondary to constipation.  Doubt metabolic instability or impending vascular collapse.  Doubt oncologic complication.  CRITICAL CARE-no Performed by: Daleen Bo   Nursing Notes Reviewed/ Care Coordinated Applicable  Imaging Reviewed Interpretation of Laboratory Data incorporated into ED treatment  The patient appears reasonably screened and/or stabilized for discharge and I doubt any other medical condition or other [facility] requiring further screening, evaluation, or treatment in the ED at this time prior to discharge.  Plan: Home Medications-use MiraLAX 2-3 times a day and continue routine medications; Home Treatments-rest, fluids, fiber; return here if the recommended treatment, does not improve the symptoms; Recommended follow up-PCP follow-up 1 week and oncology as scheduled     Final Clinical Impressions(s) / ED Diagnoses   Final diagnoses:  Constipation, unspecified constipation type  Urinary retention    ED Discharge Orders    None       Daleen Bo, MD 09/15/17 [phone]

## 2017-09-15 NOTE — ED Notes (Signed)
Attempted several times to bladder scan patient with RN Leafy Ro. Unable to get accurate number. Largest number was 300 mL. Attempted to I&O cath patient and was unsuccessful.

## 2017-09-15 NOTE — ED Notes (Addendum)
Attempting to lay patient flat in triage for bladder scan and repeat EKG. Patient moving and moaning around, hard to get clear readings due to patient movement.

## 2017-09-15 NOTE — ED Notes (Signed)
Patient provided with water and graham crackers for PO/fluid challenge. Patient states she just drank her 8 fl. Oz sprite and "I feel fine."

## 2017-09-15 NOTE — ED Triage Notes (Signed)
Pt reports being unable to urinate since last night.  Pt hx of COPD and normally on 4L.  Pt is SOB and O2 sat is 86% on 4L.  Pt's O2 bumped up to 5L and pt's sats have improved.  Pt also reports generalized body pain.

## 2017-09-16 ENCOUNTER — Encounter (HOSPITAL_COMMUNITY): Payer: Self-pay

## 2017-09-16 ENCOUNTER — Other Ambulatory Visit: Payer: Self-pay

## 2017-09-16 ENCOUNTER — Emergency Department (HOSPITAL_COMMUNITY)
Admission: EM | Admit: 2017-09-16 | Discharge: 2017-09-16 | Disposition: A | Discharge status: Home / Self Care | Payer: Medicaid Other | Attending: Emergency Medicine | Admitting: Emergency Medicine

## 2017-09-16 DIAGNOSIS — F17210 Nicotine dependence, cigarettes, uncomplicated: Secondary | ICD-10-CM | POA: Diagnosis not present

## 2017-09-16 DIAGNOSIS — J189 Pneumonia, unspecified organism: Secondary | ICD-10-CM

## 2017-09-16 DIAGNOSIS — R39198 Other difficulties with micturition: Secondary | ICD-10-CM | POA: Diagnosis not present

## 2017-09-16 DIAGNOSIS — Z85528 Personal history of other malignant neoplasm of kidney: Secondary | ICD-10-CM | POA: Diagnosis not present

## 2017-09-16 DIAGNOSIS — R531 Weakness: Secondary | ICD-10-CM | POA: Diagnosis not present

## 2017-09-16 DIAGNOSIS — J449 Chronic obstructive pulmonary disease, unspecified: Secondary | ICD-10-CM | POA: Insufficient documentation

## 2017-09-16 DIAGNOSIS — R51 Headache: Secondary | ICD-10-CM | POA: Diagnosis not present

## 2017-09-16 DIAGNOSIS — R300 Dysuria: Secondary | ICD-10-CM | POA: Diagnosis not present

## 2017-09-16 DIAGNOSIS — Z79899 Other long term (current) drug therapy: Secondary | ICD-10-CM | POA: Insufficient documentation

## 2017-09-16 DIAGNOSIS — J181 Lobar pneumonia, unspecified organism: Secondary | ICD-10-CM

## 2017-09-16 DIAGNOSIS — R05 Cough: Secondary | ICD-10-CM | POA: Diagnosis present

## 2017-09-16 LAB — CBC WITH DIFFERENTIAL/PLATELET
BASOS PCT: 0 %
Basophils Absolute: 0 10*3/uL (ref 0.0–0.1)
EOS ABS: 0.1 10*3/uL (ref 0.0–0.7)
Eosinophils Relative: 1 %
HEMATOCRIT: 36.3 % (ref 36.0–46.0)
Hemoglobin: 12 g/dL (ref 12.0–15.0)
LYMPHS ABS: 1.5 10*3/uL (ref 0.7–4.0)
Lymphocytes Relative: 14 %
MCH: 29.1 pg (ref 26.0–34.0)
MCHC: 33.1 g/dL (ref 30.0–36.0)
MCV: 87.9 fL (ref 78.0–100.0)
MONO ABS: 0.7 10*3/uL (ref 0.1–1.0)
MONOS PCT: 7 %
NEUTROS ABS: 8.4 10*3/uL — AB (ref 1.7–7.7)
NEUTROS PCT: 78 %
Platelets: 423 10*3/uL — ABNORMAL HIGH (ref 150–400)
RBC: 4.13 MIL/uL (ref 3.87–5.11)
RDW: 14.7 % (ref 11.5–15.5)
WBC: 10.8 10*3/uL — ABNORMAL HIGH (ref 4.0–10.5)

## 2017-09-16 LAB — URINALYSIS, ROUTINE W REFLEX MICROSCOPIC
Bilirubin Urine: NEGATIVE
GLUCOSE, UA: NEGATIVE mg/dL
Ketones, ur: NEGATIVE mg/dL
LEUKOCYTES UA: NEGATIVE
NITRITE: NEGATIVE
PH: 5 (ref 5.0–8.0)
Protein, ur: 30 mg/dL — AB
SPECIFIC GRAVITY, URINE: 1.021 (ref 1.005–1.030)

## 2017-09-16 LAB — BASIC METABOLIC PANEL
Anion gap: 12 (ref 5–15)
BUN: 21 mg/dL (ref 8–23)
CHLORIDE: 103 mmol/L (ref 98–111)
CO2: 27 mmol/L (ref 22–32)
CREATININE: 0.91 mg/dL (ref 0.44–1.00)
Calcium: 9.8 mg/dL (ref 8.9–10.3)
GFR calc non Af Amer: >60 mL/min (ref >60)
GLUCOSE: 77 mg/dL (ref 70–99)
Potassium: 4.5 mmol/L (ref 3.5–5.1)
Sodium: 142 mmol/L (ref 135–145)

## 2017-09-16 LAB — I-STAT CHEM 8, ED
BUN: 26 mg/dL — AB (ref 8–23)
Calcium, Ion: 1.11 mmol/L — ABNORMAL LOW (ref 1.15–1.40)
Chloride: 103 mmol/L (ref 98–111)
Creatinine, Ser: 1 mg/dL (ref 0.44–1.00)
Glucose, Bld: 69 mg/dL — ABNORMAL LOW (ref 70–99)
HCT: 36 % (ref 36.0–46.0)
HEMOGLOBIN: 12.2 g/dL (ref 12.0–15.0)
Potassium: 5.3 mmol/L — ABNORMAL HIGH (ref 3.5–5.1)
SODIUM: 136 mmol/L (ref 135–145)
TCO2: 28 mmol/L (ref 22–32)

## 2017-09-16 LAB — I-STAT CG4 LACTIC ACID, ED: LACTIC ACID, VENOUS: 1.03 mmol/L (ref 0.5–1.9)

## 2017-09-16 MED ORDER — SODIUM CHLORIDE 0.9 % IV BOLUS
1000.0000 mL | Freq: Once | INTRAVENOUS | Status: AC
  Administered 2017-09-16: 1000 mL via INTRAVENOUS

## 2017-09-16 MED ORDER — HYDROXYZINE HCL 25 MG PO TABS
50.0000 mg | ORAL_TABLET | Freq: Once | ORAL | Status: AC
  Administered 2017-09-16: 50 mg via ORAL
  Filled 2017-09-16: qty 2

## 2017-09-16 MED ORDER — AZITHROMYCIN 250 MG PO TABS: 250.0000 mg | ORAL_TABLET | Freq: Every day | ORAL | 0 refills | Status: DC

## 2017-09-16 MED ORDER — KETOROLAC TROMETHAMINE 30 MG/ML IJ SOLN
15.0000 mg | Freq: Once | INTRAMUSCULAR | Status: AC
  Administered 2017-09-16: 15 mg via INTRAVENOUS
  Filled 2017-09-16: qty 1

## 2017-09-16 MED ORDER — CEPHALEXIN 500 MG PO CAPS: 500.0000 mg | ORAL_CAPSULE | Freq: Four times a day (QID) | ORAL | 0 refills | Status: DC

## 2017-09-16 NOTE — ED Provider Notes (Signed)
[facility] DEPT Provider Note   CSN: [id number] Arrival date & time: 09/16/17  1339     History   Chief Complaint Chief Complaint  Patient presents with  . Dysuria    HPI [name] is a 67 y.o. female.  67 yo F with a chief complaint of generalized body aches weakness difficulty urinating cough and congestion.  Going on for the past 3 months or longer.  She feels it is gotten somewhat worse over the past few days.  Has been coughing more often for the past week or so.  Had a fever at home of 101 a couple days ago.  Her main complaint is she feels that she cannot urinate.  She was able to provide a sample by cath yesterday but otherwise has not been able to urinate.  She has diffuse abdominal pain.  Nothing seems to make this better or worse.  Denies flank pain.  Having some lower back pain.  Headaches that seem to come and go.  The history is provided by the patient.  Dysuria   Pertinent negatives include no chills, no nausea, no vomiting and no urgency.  Illness  This is a new problem. The current episode started 2 days ago. The problem occurs constantly. The problem has not changed since onset.Associated symptoms include abdominal pain, headaches and shortness of breath. Pertinent negatives include no chest pain. Nothing aggravates the symptoms. Nothing relieves the symptoms. She has tried nothing for the symptoms. The treatment provided no relief.    Past Medical History:  Diagnosis Date  . Cancer ([place])    renal ca  . COPD (chronic obstructive pulmonary disease) ([place])   . Drug-seeking behavior   . Pancreatitis   . Pancreatitis   . Seizures ([place])   . Substance abuse [facility])     Patient Active Problem List   Diagnosis Date Noted  . Osteoarthritis of facet joint of lumbar spine 07/26/2017  . Low back pain radiating to both legs 07/26/2017  . 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 Failure to thrive in  adult 06/21/2017  . Pressure injury of skin 04/06/2017  . Pressure injury of skin of right ischial tuberosity region: Stage 2 04/06/2017  . Acute urinary retention 04/04/2017  . Benzodiazepine dependence ([place])   . AKI (acute kidney injury) ([place]) 04/03/2017  . Hip fracture, right ([place]) 06/02/2016  . Narcotic addiction ([place]) 02/29/2016  . Abdominal pain, chronic, epigastric- due to chronic pancreatitis 02/29/2016  . COPD (chronic obstructive pulmonary disease) ([place]) 02/28/2016  . Cough 09/03/2015  . Abdominal pain 01/03/2015  . Hyponatremia 01/03/2015  . Hypocalcemia 01/03/2015  . Underweight 01/03/2015  . COPD GOLD III with reversible component  07/17/2014  . DTs (delirium tremens) ([place]) 06/02/2013  . Chronic alcoholic pancreatitis ([place]) 06/02/2013  . Lactic acidosis 06/02/2013  . Recurrent acute pancreatitis 03/28/2013  . Alcohol withdrawal ([place]) 03/28/2013  . Pancreatitis 03/09/2013  . Tobacco abuse 03/09/2013  . Alcohol withdrawal syndrome with perceptual disturbance ([place]) 03/09/2013  . Acute alcoholic pancreatitis 13/08/6576  . Benzodiazepine withdrawal ([place]) 02/09/2013  . Protein-calorie malnutrition, severe ([place]) 02/09/2013  . Hypokalemia 02/27/2011  . Abdominal pain, acute 02/25/2011  . Nausea and vomiting 02/25/2011  . Thrombocytopenia ([place]) 02/25/2011  . COPD with acute exacerbation ([place]) 02/25/2011  . HTN (hypertension), malignant 02/25/2011  . Malignant neoplasm of kidney excluding renal pelvis ([place]) 10/18/2008  . Anxiety state 10/18/2008  . GERD 10/18/2008  . PANCREATITIS, CHRONIC- atrophic pancreas 10/18/2008  .  DYSPNEA 10/18/2008  . GASTRIC ULCER, HX OF 10/18/2008    Past Surgical History:  Procedure Laterality Date  . IR GENERIC HISTORICAL  05/08/2015   IR RADIOLOGIST EVAL & MGMT 05/08/2015 Aletta Edouard, MD [facility] INTERV RAD  . IR GENERIC HISTORICAL  01/08/2014   IR RADIOLOGIST EVAL & MGMT 01/08/2014 Aletta Edouard, MD [facility] INTERV RAD  . KIDNEY SURGERY      removed cancerous lesions  . PARTIAL GASTRECTOMY       OB History   None      Home Medications    Prior to Admission medications   Medication Sig Start Date End Date Taking? Authorizing Provider  famotidine (PEPCID) 20 MG tablet Take 1 tablet (20 mg total) by mouth daily. Patient taking differently: Take 20 mg by mouth daily as needed for heartburn or indigestion.  08/28/17  Yes Khatri, Hina, PA-C  mometasone-formoterol (DULERA) 200-5 MCG/ACT AERO Inhale 2 puffs into the lungs 2 (two) times daily. 08/08/17  Yes Azzie Glatter, FNP  ondansetron (ZOFRAN) 4 MG tablet Take 1 tablet (4 mg total) by mouth every 8 (eight) hours as needed for nausea or vomiting. 08/23/17  Yes Azzie Glatter, FNP  polyethylene glycol powder (GLYCOLAX/MIRALAX) powder Mix 1 capful of powder into drink and take by mouth 1-3 times daily until daily soft stools OTC 08/30/17  Yes Little, Wenda Overland, MD  PROAIR HFA 108 [phone] Base) MCG/ACT inhaler Inhale 1 puff into the lungs every 4 (four) hours as needed for wheezing or shortness of breath.  08/02/17  Yes [provider]  SPIRIVA HANDIHALER 18 MCG inhalation capsule PLACE 1 CAPSULE INTO INHALER AND INHALE DAILY Patient taking differently: Place 18 mcg into inhaler and inhale daily as needed (shortness of breath).  09/08/17  Yes Azzie Glatter, FNP  azithromycin (ZITHROMAX) 250 MG tablet Take 1 tablet (250 mg total) by mouth daily. Take first 2 tablets together, then 1 every day until finished. 09/16/17   Deno Etienne, DO  busPIRone (BUSPAR) 10 MG tablet Take 1 tablet (10 mg total) by mouth 3 (three) times daily. Patient not taking: Reported on 09/15/2017 08/23/17   Azzie Glatter, FNP  cephALEXin (KEFLEX) 500 MG capsule Take 1 capsule (500 mg total) by mouth 4 (four) times daily. 09/16/17   Deno Etienne, DO  hydrOXYzine (ATARAX/VISTARIL) 25 MG tablet Take 1 tablet (25 mg total) by mouth 3 (three) times daily as needed (anxiety). Patient not taking: Reported on  09/15/2017 08/08/17   Azzie Glatter, FNP    Family History Family History  Problem Relation Age of Onset  . CAD Mother   . Alcoholism Father   . COPD Father     Social History Social History   Tobacco Use  . Smoking status: Current Every Day Smoker    Packs/day: 1.50    Years: 30.00    Pack years: 45.00    Types: Cigarettes  . Smokeless tobacco: Never Used  . Tobacco comment: smoking up to 1.5 ppd per husband. down from 3pk day to 1.5   Substance Use Topics  . Alcohol use: Not Currently    Alcohol/week: 0.0 standard drinks    Comment: "I stopped drinking 1 year ago"  . Drug use: No    Comment: Hx of polysubstance drug abuse     Allergies   Aspirin and Chlorpromazine hcl   Review of Systems Review of Systems  Constitutional: Positive for fever. Negative for chills.  HENT: Positive for congestion. Negative for rhinorrhea.  Eyes: Negative for redness and visual disturbance.  Respiratory: Positive for cough and shortness of breath. Negative for wheezing.   Cardiovascular: Negative for chest pain and palpitations.  Gastrointestinal: Positive for abdominal pain. Negative for nausea and vomiting.  Genitourinary: Positive for dysuria. Negative for urgency.  Musculoskeletal: Positive for myalgias. Negative for arthralgias.  Skin: Negative for pallor and wound.  Neurological: Positive for headaches. Negative for dizziness.     Physical Exam Updated Vital Signs BP (!) 144/98 (BP Location: Left Arm)   Pulse 90   Temp 97.6 F (36.4 C) (Oral)   Resp 15   SpO2 96%   Physical Exam  Constitutional: She is oriented to person, place, and time. She appears well-developed and well-nourished. No distress.  Cachectic, chronically ill-appearing  HENT:  Head: Normocephalic and atraumatic.  Eyes: Pupils are equal, round, and reactive to light. EOM are normal.  Neck: Normal range of motion. Neck supple.  Cardiovascular: Regular rhythm. Tachycardia present. Exam reveals no  gallop and no friction rub.  No murmur heard. Pulmonary/Chest: Effort normal. She has no wheezes. She has no rales.  Abdominal: Soft. She exhibits no distension. There is no tenderness.  Musculoskeletal: She exhibits no edema or tenderness.  Neurological: She is alert and oriented to person, place, and time.  Skin: Skin is warm and dry. She is not diaphoretic.  Psychiatric: She has a normal mood and affect. Her behavior is normal.  Nursing note and vitals reviewed.    ED Treatments / Results  Labs (all labs ordered are listed, but only abnormal results are displayed) Labs Reviewed  URINALYSIS, ROUTINE W REFLEX MICROSCOPIC - Abnormal; Notable for the following components:      Result Value   Color, Urine AMBER (*)    Hgb urine dipstick SMALL (*)    Protein, ur 30 (*)    Bacteria, UA FEW (*)    All other components within normal limits  CBC WITH DIFFERENTIAL/PLATELET - Abnormal; Notable for the following components:   WBC 10.8 (*)    Platelets 423 (*)    Neutro Abs 8.4 (*)    All other components within normal limits  I-STAT CHEM 8, ED - Abnormal; Notable for the following components:   Potassium 5.3 (*)    BUN 26 (*)    Glucose, Bld 69 (*)    Calcium, Ion 1.11 (*)    All other components within normal limits  CULTURE, BLOOD (ROUTINE X 2)  CULTURE, BLOOD (ROUTINE X 2)  BASIC METABOLIC PANEL  CBC WITH DIFFERENTIAL/PLATELET  I-STAT CG4 LACTIC ACID, ED  I-STAT CG4 LACTIC ACID, ED    EKG EKG Interpretation  Date/Time:  Friday September 16 2017 16:46:39 EDT Ventricular Rate:  92 PR Interval:    QRS Duration: 75 QT Interval:  355 QTC Calculation: 440 R Axis:   84 Text Interpretation:  Sinus rhythm Right atrial enlargement Borderline right axis deviation Nonspecific T abnormalities, lateral leads Since last tracing rate slower Otherwise no significant change Confirmed by Deno Etienne [phone]) on 09/16/2017 5:01:29 PM   Radiology Dg Chest 2 View  Result Date:  09/15/2017 CLINICAL DATA:  Pt reports being unable to urinate since last night. Pt hx of COPD and normally on 4L. Pt is SOB and O2 sat is 86%. Smoker. EXAM: CHEST - 2 VIEW COMPARISON:  09/04/2017 FINDINGS: Lungs are markedly hyperinflated. The heart size is normal. In the LEFT LOWER lobe, oval mass is estimated to measure 4.1 x 3.0 centimeters, larger compared to prior studies. There is  patchy opacity in the RIGHT lung base, consistent with infectious abnormality. No pulmonary edema. IMPRESSION: Increasing size of LEFT LOWER lobe mass. RIGHT LOWER lobe infiltrate. Electronically Signed   By: Nolon Nations M.D.   On: 09/15/2017 16:30    Procedures BLADDER CATHETERIZATION Date/Time: 09/16/2017 9:13 PM Performed by: Deno Etienne, DO Authorized by: Deno Etienne, DO   Consent:    Consent obtained:  Verbal   Consent given by:  Patient   Risks discussed:  Infection and pain   Alternatives discussed:  No treatment, delayed treatment and alternative treatment Pre-procedure details:    Procedure purpose:  Therapeutic   Preparation: Patient was prepped and draped in usual sterile fashion   Anesthesia (see MAR for exact dosages):    Anesthesia method:  None Procedure details:    Provider performed due to:  Nurse unable to complete and complicated insertion   Catheter insertion:  Non-indwelling   Catheter type:  Pediatric feeding tube   Catheter size:  22 Fr   Bladder irrigation: no     Number of attempts:  1   Urine characteristics:  Yellow Post-procedure details:    Patient tolerance of procedure:  Tolerated well, no immediate complications   (including critical care time) Procedure note: Ultrasound Guided Peripheral IV Ultrasound guided peripheral 1.88 inch angiocath IV placement performed by me. Indications: Nursing unable to place IV. Details: The antecubital fossa and upper arm were evaluated with a multifrequency linear probe. Patent brachial veins were noted. 1 attempt was made to cannulate  a vein under realtime US guidance with successful cannulation of the vein and catheter placement. There is return of non-pulsatile dark red blood. The patient tolerated the procedure well without complications. Images archived electronically.  CPT codes: [phone] and [phone]  Medications Ordered in ED Medications  sodium chloride 0.9 % bolus 1,000 mL (0 mLs Intravenous Stopped 09/16/17 2203)  ketorolac (TORADOL) 30 MG/ML injection 15 mg (15 mg Intravenous Given 09/16/17 2135)  hydrOXYzine (ATARAX/VISTARIL) tablet 50 mg (50 mg Oral Given 09/16/17 2135)     Initial Impression / Assessment and Plan / ED Course  I have reviewed the triage vital signs and the nursing notes.  Pertinent labs & imaging results that were available during my care of the patient were reviewed by me and considered in my medical decision making (see chart for details).     67 yo F with chief complaint of inability to urinate.  Patient's initial vital signs in triage for heart rate of 125 and a blood pressure of 105/82.  Patient has had multiple visits to the ED recently thought to be drug-seeking behavior, she is recently seen her oncologist as well.  Has a history of renal cell carcinoma that was believed to have been eradicated with radiofrequency ablation and cryoablation.  She was recently discovered to have a new lung mass concerning for metastatic disease.  The patient was seen yesterday I see no documentation of urinary retention to nursing notes, will obtain a bladder scan.  Give a liter of fluids.  Check labs.  Heart rate of 125.  The patient has been coughing and having fevers at home with the infiltrate seen on chest x-ray yesterday I will assume that she has pneumonia.  We will check a lactate and blood cultures.  Labs reassuring, no change to renal function.  Patient had 300 cc of urine in her bladder.  She stated that she was unable to go.  Nursing attempted to do an and out multiple times without  success.  I tried  and was able to insert the catheter into the urethra with no resistance.  No signs of bladder or uterine prolapse.  Again discussed possible medication complications and the patient denies any home medications.  I am unsure of her subjective inability to urinate.  Patient UA negative for infection.  Will treat with abx for pna.  Tachycardia improved prior to intervention. PCP follow up.   10:37 PM:  I have discussed the diagnosis/risks/treatment options with the patient and believe the pt to be eligible for discharge home to follow-up with PCP. We also discussed returning to the ED immediately if new or worsening sx occur. We discussed the sx which are most concerning (e.g., sudden worsening pain, fever, inability to tolerate by mouth) that necessitate immediate return. Medications administered to the patient during their visit and any new prescriptions provided to the patient are listed below.  Medications given during this visit Medications  sodium chloride 0.9 % bolus 1,000 mL (0 mLs Intravenous Stopped 09/16/17 2203)  ketorolac (TORADOL) 30 MG/ML injection 15 mg (15 mg Intravenous Given 09/16/17 2135)  hydrOXYzine (ATARAX/VISTARIL) tablet 50 mg (50 mg Oral Given 09/16/17 2135)      The patient appears reasonably screen and/or stabilized for discharge and I doubt any other medical condition or other [facility] requiring further screening, evaluation, or treatment in the ED at this time prior to discharge.    Final Clinical Impressions(s) / ED Diagnoses   Final diagnoses:  Community acquired pneumonia of right lower lobe of lung [facility])    ED Discharge Orders         Ordered    cephALEXin (KEFLEX) 500 MG capsule  4 times daily     09/16/17 2142    azithromycin (ZITHROMAX) 250 MG tablet  Daily     09/16/17 2142           Deno Etienne, DO 09/16/17 2237

## 2017-09-16 NOTE — ED Triage Notes (Signed)
Pt reports that she hasn't been able to urinate since she left yesterday and is experiencing SOB. She has a hx of COPD, SpO2 is 96% on room air. She also reports generalized pain and weakness.

## 2017-09-16 NOTE — Discharge Instructions (Signed)
Take tylenol 2 pills 4 times a day and motrin 4 pills 3 times a day.  Drink plenty of fluids.  Return for worsening shortness of breath, headache, confusion. Follow up with your family doctor.   

## 2017-09-17 ENCOUNTER — Other Ambulatory Visit: Payer: Self-pay

## 2017-09-17 ENCOUNTER — Encounter (HOSPITAL_COMMUNITY): Payer: Self-pay

## 2017-09-17 ENCOUNTER — Inpatient Hospital Stay (HOSPITAL_COMMUNITY)
Admission: EM | Admit: 2017-09-17 | Discharge: 2017-09-24 | DRG: 193 | Disposition: A | Discharge status: Home Health | Payer: Medicaid Other | Attending: Internal Medicine | Admitting: Internal Medicine

## 2017-09-17 ENCOUNTER — Emergency Department (HOSPITAL_COMMUNITY): Payer: Medicaid Other

## 2017-09-17 DIAGNOSIS — Z903 Acquired absence of stomach [part of]: Secondary | ICD-10-CM

## 2017-09-17 DIAGNOSIS — J90 Pleural effusion, not elsewhere classified: Secondary | ICD-10-CM | POA: Diagnosis present

## 2017-09-17 DIAGNOSIS — S22060A Wedge compression fracture of T7-T8 vertebra, initial encounter for closed fracture: Secondary | ICD-10-CM

## 2017-09-17 DIAGNOSIS — Z825 Family history of asthma and other chronic lower respiratory diseases: Secondary | ICD-10-CM

## 2017-09-17 DIAGNOSIS — Z23 Encounter for immunization: Secondary | ICD-10-CM

## 2017-09-17 DIAGNOSIS — R0902 Hypoxemia: Secondary | ICD-10-CM | POA: Diagnosis present

## 2017-09-17 DIAGNOSIS — F419 Anxiety disorder, unspecified: Secondary | ICD-10-CM | POA: Diagnosis present

## 2017-09-17 DIAGNOSIS — I959 Hypotension, unspecified: Secondary | ICD-10-CM | POA: Diagnosis present

## 2017-09-17 DIAGNOSIS — K861 Other chronic pancreatitis: Secondary | ICD-10-CM | POA: Diagnosis present

## 2017-09-17 DIAGNOSIS — J189 Pneumonia, unspecified organism: Principal | ICD-10-CM | POA: Diagnosis present

## 2017-09-17 DIAGNOSIS — G8929 Other chronic pain: Secondary | ICD-10-CM | POA: Diagnosis present

## 2017-09-17 DIAGNOSIS — Z85528 Personal history of other malignant neoplasm of kidney: Secondary | ICD-10-CM

## 2017-09-17 DIAGNOSIS — Z9981 Dependence on supplemental oxygen: Secondary | ICD-10-CM

## 2017-09-17 DIAGNOSIS — M8588 Other specified disorders of bone density and structure, other site: Secondary | ICD-10-CM | POA: Diagnosis present

## 2017-09-17 DIAGNOSIS — B952 Enterococcus as the cause of diseases classified elsewhere: Secondary | ICD-10-CM | POA: Diagnosis present

## 2017-09-17 DIAGNOSIS — Z681 Body mass index (BMI) 19 or less, adult: Secondary | ICD-10-CM

## 2017-09-17 DIAGNOSIS — N390 Urinary tract infection, site not specified: Secondary | ICD-10-CM | POA: Diagnosis present

## 2017-09-17 DIAGNOSIS — C3432 Malignant neoplasm of lower lobe, left bronchus or lung: Secondary | ICD-10-CM | POA: Diagnosis present

## 2017-09-17 DIAGNOSIS — R569 Unspecified convulsions: Secondary | ICD-10-CM | POA: Diagnosis present

## 2017-09-17 DIAGNOSIS — R0602 Shortness of breath: Secondary | ICD-10-CM

## 2017-09-17 DIAGNOSIS — Z765 Malingerer [conscious simulation]: Secondary | ICD-10-CM

## 2017-09-17 DIAGNOSIS — J440 Chronic obstructive pulmonary disease with acute lower respiratory infection: Secondary | ICD-10-CM | POA: Diagnosis present

## 2017-09-17 DIAGNOSIS — E43 Unspecified severe protein-calorie malnutrition: Secondary | ICD-10-CM | POA: Diagnosis present

## 2017-09-17 DIAGNOSIS — R64 Cachexia: Secondary | ICD-10-CM | POA: Diagnosis present

## 2017-09-17 DIAGNOSIS — F1110 Opioid abuse, uncomplicated: Secondary | ICD-10-CM | POA: Diagnosis present

## 2017-09-17 DIAGNOSIS — Z8249 Family history of ischemic heart disease and other diseases of the circulatory system: Secondary | ICD-10-CM

## 2017-09-17 DIAGNOSIS — F17210 Nicotine dependence, cigarettes, uncomplicated: Secondary | ICD-10-CM | POA: Diagnosis present

## 2017-09-17 DIAGNOSIS — M8448XA Pathological fracture, other site, initial encounter for fracture: Secondary | ICD-10-CM | POA: Diagnosis present

## 2017-09-17 LAB — CBC WITH DIFFERENTIAL/PLATELET
Basophils Absolute: 0 10*3/uL (ref 0.0–0.1)
Basophils Relative: 1 %
EOS ABS: 0.2 10*3/uL (ref 0.0–0.7)
EOS PCT: 2 %
HCT: 35.3 % — ABNORMAL LOW (ref 36.0–46.0)
Hemoglobin: 11.4 g/dL — ABNORMAL LOW (ref 12.0–15.0)
LYMPHS PCT: 17 %
Lymphs Abs: 1.5 10*3/uL (ref 0.7–4.0)
MCH: 28.4 pg (ref 26.0–34.0)
MCHC: 32.3 g/dL (ref 30.0–36.0)
MCV: 87.8 fL (ref 78.0–100.0)
MONO ABS: 0.5 10*3/uL (ref 0.1–1.0)
MONOS PCT: 5 %
Neutro Abs: 6.6 10*3/uL (ref 1.7–7.7)
Neutrophils Relative %: 75 %
PLATELETS: 426 10*3/uL — AB (ref 150–400)
RBC: 4.02 MIL/uL (ref 3.87–5.11)
RDW: 14.7 % (ref 11.5–15.5)
WBC: 8.8 10*3/uL (ref 4.0–10.5)

## 2017-09-17 LAB — COMPREHENSIVE METABOLIC PANEL
ALBUMIN: 3.5 g/dL (ref 3.5–5.0)
ALT: 11 U/L (ref 0–44)
AST: 16 U/L (ref 15–41)
Alkaline Phosphatase: 89 U/L (ref 38–126)
Anion gap: 10 (ref 5–15)
BUN: 18 mg/dL (ref 8–23)
CHLORIDE: 105 mmol/L (ref 98–111)
CO2: 27 mmol/L (ref 22–32)
CREATININE: 0.78 mg/dL (ref 0.44–1.00)
Calcium: 9.5 mg/dL (ref 8.9–10.3)
GFR calc Af Amer: >60 mL/min (ref >60)
GLUCOSE: 90 mg/dL (ref 70–99)
Potassium: 3.8 mmol/L (ref 3.5–5.1)
SODIUM: 142 mmol/L (ref 135–145)
Total Bilirubin: 0.4 mg/dL (ref 0.3–1.2)
Total Protein: 7.2 g/dL (ref 6.5–8.1)

## 2017-09-17 LAB — CREATININE, SERUM
Creatinine, Ser: 0.69 mg/dL (ref 0.44–1.00)
GFR calc non Af Amer: >60 mL/min (ref >60)

## 2017-09-17 LAB — URINALYSIS, ROUTINE W REFLEX MICROSCOPIC
BILIRUBIN URINE: NEGATIVE
GLUCOSE, UA: NEGATIVE mg/dL
KETONES UR: NEGATIVE mg/dL
Nitrite: NEGATIVE
PH: 5 (ref 5.0–8.0)
PROTEIN: NEGATIVE mg/dL
SPECIFIC GRAVITY, URINE: 1.019 (ref 1.005–1.030)
WBC, UA: >50 WBC/hpf — ABNORMAL HIGH (ref 0–5)

## 2017-09-17 LAB — CBC
HCT: 26.9 % — ABNORMAL LOW (ref 36.0–46.0)
Hemoglobin: 8.6 g/dL — ABNORMAL LOW (ref 12.0–15.0)
MCH: 28.1 pg (ref 26.0–34.0)
MCHC: 32 g/dL (ref 30.0–36.0)
MCV: 87.9 fL (ref 78.0–100.0)
PLATELETS: 325 10*3/uL (ref 150–400)
RBC: 3.06 MIL/uL — ABNORMAL LOW (ref 3.87–5.11)
RDW: 14.9 % (ref 11.5–15.5)
WBC: 7.5 10*3/uL (ref 4.0–10.5)

## 2017-09-17 LAB — I-STAT CG4 LACTIC ACID, ED
LACTIC ACID, VENOUS: 1.33 mmol/L (ref 0.5–1.9)
Lactic Acid, Venous: 0.44 mmol/L — ABNORMAL LOW (ref 0.5–1.9)

## 2017-09-17 LAB — STREP PNEUMONIAE URINARY ANTIGEN: Strep Pneumo Urinary Antigen: NEGATIVE

## 2017-09-17 MED ORDER — MIRABEGRON ER 25 MG PO TB24
25.0000 mg | ORAL_TABLET | Freq: Every day | ORAL | Status: DC
  Administered 2017-09-17 – 2017-09-24 (×8): 25 mg via ORAL
  Filled 2017-09-17 (×9): qty 1

## 2017-09-17 MED ORDER — SODIUM CHLORIDE 0.9 % IV SOLN: 2.0000 g | Freq: Once | INTRAVENOUS | Status: DC

## 2017-09-17 MED ORDER — TAMSULOSIN HCL 0.4 MG PO CAPS
0.4000 mg | ORAL_CAPSULE | Freq: Every day | ORAL | Status: DC
  Administered 2017-09-17 – 2017-09-24 (×8): 0.4 mg via ORAL
  Filled 2017-09-17 (×8): qty 1

## 2017-09-17 MED ORDER — SODIUM CHLORIDE 0.9 % IV SOLN
500.0000 mg | INTRAVENOUS | Status: DC
  Administered 2017-09-18: 500 mg via INTRAVENOUS
  Filled 2017-09-17 (×2): qty 500

## 2017-09-17 MED ORDER — TIOTROPIUM BROMIDE MONOHYDRATE 18 MCG IN CAPS
18.0000 ug | ORAL_CAPSULE | Freq: Every day | RESPIRATORY_TRACT | Status: DC | PRN
  Filled 2017-09-17: qty 5

## 2017-09-17 MED ORDER — LOPERAMIDE HCL 2 MG PO CAPS: 2.0000 mg | ORAL_CAPSULE | ORAL | Status: AC | PRN

## 2017-09-17 MED ORDER — CLONIDINE HCL 0.1 MG PO TABS
0.1000 mg | ORAL_TABLET | Freq: Every day | ORAL | Status: AC
  Administered 2017-09-22 – 2017-09-23 (×2): 0.1 mg via ORAL
  Filled 2017-09-17 (×2): qty 1

## 2017-09-17 MED ORDER — SODIUM CHLORIDE 0.9 % IV SOLN
1.0000 g | INTRAVENOUS | Status: DC
  Administered 2017-09-17 – 2017-09-19 (×3): 1 g via INTRAVENOUS
  Filled 2017-09-17 (×2): qty 10
  Filled 2017-09-17 (×2): qty 1

## 2017-09-17 MED ORDER — HYDROXYZINE HCL 25 MG PO TABS
25.0000 mg | ORAL_TABLET | Freq: Four times a day (QID) | ORAL | Status: DC | PRN
  Administered 2017-09-17 (×2): 25 mg via ORAL
  Filled 2017-09-17 (×2): qty 1

## 2017-09-17 MED ORDER — ONDANSETRON 4 MG PO TBDP
4.0000 mg | ORAL_TABLET | Freq: Four times a day (QID) | ORAL | Status: AC | PRN
  Administered 2017-09-18 – 2017-09-21 (×3): 4 mg via ORAL
  Filled 2017-09-17 (×3): qty 1

## 2017-09-17 MED ORDER — CLONIDINE HCL 0.1 MG PO TABS
0.1000 mg | ORAL_TABLET | Freq: Four times a day (QID) | ORAL | Status: AC
  Administered 2017-09-17 – 2017-09-19 (×9): 0.1 mg via ORAL
  Filled 2017-09-17 (×9): qty 1

## 2017-09-17 MED ORDER — SODIUM CHLORIDE 0.9 % IV SOLN
1000.0000 mL | INTRAVENOUS | Status: DC
  Administered 2017-09-17 – 2017-09-19 (×5): 1000 mL via INTRAVENOUS

## 2017-09-17 MED ORDER — ENOXAPARIN SODIUM 30 MG/0.3ML ~~LOC~~ SOLN
30.0000 mg | SUBCUTANEOUS | Status: DC
  Administered 2017-09-17: 30 mg via SUBCUTANEOUS
  Filled 2017-09-17 (×2): qty 0.3

## 2017-09-17 MED ORDER — SODIUM CHLORIDE 0.9 % IV SOLN
500.0000 mg | INTRAVENOUS | Status: DC
  Administered 2017-09-17: 500 mg via INTRAVENOUS
  Filled 2017-09-17: qty 500

## 2017-09-17 MED ORDER — METHOCARBAMOL 500 MG PO TABS
500.0000 mg | ORAL_TABLET | Freq: Three times a day (TID) | ORAL | Status: DC | PRN
  Administered 2017-09-17 – 2017-09-19 (×4): 500 mg via ORAL
  Filled 2017-09-17 (×4): qty 1

## 2017-09-17 MED ORDER — MOMETASONE FURO-FORMOTEROL FUM 200-5 MCG/ACT IN AERO
2.0000 | INHALATION_SPRAY | Freq: Two times a day (BID) | RESPIRATORY_TRACT | Status: DC
  Administered 2017-09-17 – 2017-09-24 (×13): 2 via RESPIRATORY_TRACT
  Filled 2017-09-17: qty 8.8

## 2017-09-17 MED ORDER — CLONIDINE HCL 0.1 MG PO TABS
0.1000 mg | ORAL_TABLET | ORAL | Status: AC
  Administered 2017-09-19 – 2017-09-21 (×4): 0.1 mg via ORAL
  Filled 2017-09-17 (×5): qty 1

## 2017-09-17 NOTE — ED Notes (Signed)
Patient transported to X-ray 

## 2017-09-17 NOTE — Progress Notes (Signed)
I&O cath with 500cc of amber colored urine noted

## 2017-09-17 NOTE — ED Notes (Signed)
PT AWARE OF ADMISSION. PT AWARE THIS WRITER WILL SPEAK TO EDP ABOUT PAIN AND NERVE MEDICATION

## 2017-09-17 NOTE — ED Triage Notes (Signed)
She tells [place] she was seen here yesterday and was dx with pneumonia. She also has an impending P.E.T. Scan to elucidate a "spot" on her left lung which was found recently. She is here with c/o n/v and "unable to take my antibiotic pill". She is moderately short of breath and in no distress.

## 2017-09-17 NOTE — ED Notes (Signed)
Pt requested pain meds and an in-and-out cath bc she does not think she can void on her own. RN made aware.

## 2017-09-17 NOTE — ED Notes (Signed)
ADMISSION Provider at bedside. 

## 2017-09-17 NOTE — Progress Notes (Signed)
CRITICAL VALUE ALERT  Critical Value:  Hgb 8.6  Date & Time Notied:  9/14  1900  Provider Notified: Dr. Verlon Au  Orders Received/Actions taken: none

## 2017-09-17 NOTE — ED Provider Notes (Signed)
[facility] DEPT Provider Note   CSN: [id number] Arrival date & time: 09/17/17  1014     History   Chief Complaint Chief Complaint  Patient presents with  . Shortness of Breath    HPI [name] is a 67 y.o. female.  67 year old female with history of COPD who was diagnosed with pneumonia yesterday and placed on antibiotics presents due to increasing weakness at home.  States that she has trouble swallowing her pills as well and has caused her to have emesis.  Her weakness has been worse when she stands up.  She is on home oxygen and she states that has been stable.  No diarrhea appreciated.  Denies any dysuria.  No anginal or CHF symptoms.     Past Medical History:  Diagnosis Date  . Cancer ([place])    renal ca  . COPD (chronic obstructive pulmonary disease) ([place])   . Drug-seeking behavior   . Pancreatitis   . Pancreatitis   . Seizures ([place])   . Substance abuse [facility])     Patient Active Problem List   Diagnosis Date Noted  . Osteoarthritis of facet joint of lumbar spine 07/26/2017  . Low back pain radiating to both legs 07/26/2017  . 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 Failure to thrive in adult 06/21/2017  . Pressure injury of skin 04/06/2017  . Pressure injury of skin of right ischial tuberosity region: Stage 2 04/06/2017  . Acute urinary retention 04/04/2017  . Benzodiazepine dependence ([place])   . AKI (acute kidney injury) ([place]) 04/03/2017  . Hip fracture, right ([place]) 06/02/2016  . Narcotic addiction ([place]) 02/29/2016  . Abdominal pain, chronic, epigastric- due to chronic pancreatitis 02/29/2016  . COPD (chronic obstructive pulmonary disease) ([place]) 02/28/2016  . Cough 09/03/2015  . Abdominal pain 01/03/2015  . Hyponatremia 01/03/2015  . Hypocalcemia 01/03/2015  . Underweight 01/03/2015  . COPD GOLD III with reversible component  07/17/2014  . DTs (delirium tremens) ([place]) 06/02/2013  .  Chronic alcoholic pancreatitis ([place]) 06/02/2013  . Lactic acidosis 06/02/2013  . Recurrent acute pancreatitis 03/28/2013  . Alcohol withdrawal ([place]) 03/28/2013  . Pancreatitis 03/09/2013  . Tobacco abuse 03/09/2013  . Alcohol withdrawal syndrome with perceptual disturbance ([place]) 03/09/2013  . Acute alcoholic pancreatitis 36/14/4315  . Benzodiazepine withdrawal ([place]) 02/09/2013  . Protein-calorie malnutrition, severe ([place]) 02/09/2013  . Hypokalemia 02/27/2011  . Abdominal pain, acute 02/25/2011  . Nausea and vomiting 02/25/2011  . Thrombocytopenia ([place]) 02/25/2011  . COPD with acute exacerbation ([place]) 02/25/2011  . HTN (hypertension), malignant 02/25/2011  . 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 Anxiety state 10/18/2008  . GERD 10/18/2008  . PANCREATITIS, CHRONIC- atrophic pancreas 10/18/2008  . DYSPNEA 10/18/2008  . GASTRIC ULCER, HX OF 10/18/2008    Past Surgical History:  Procedure Laterality Date  . IR GENERIC HISTORICAL  05/08/2015   IR RADIOLOGIST EVAL & MGMT 05/08/2015 Aletta Edouard, MD [facility] INTERV RAD  . IR GENERIC HISTORICAL  01/08/2014   IR RADIOLOGIST EVAL & MGMT 01/08/2014 Aletta Edouard, MD [facility] INTERV RAD  . KIDNEY SURGERY     removed cancerous lesions  . PARTIAL GASTRECTOMY       OB History   None      Home Medications    Prior to Admission medications   Medication Sig Start Date End Date Taking? Authorizing Provider  famotidine (PEPCID) 20 MG tablet Take 1 tablet (20 mg total) by mouth daily. Patient taking differently: Take  20 mg by mouth daily as needed for heartburn or indigestion.  08/28/17  Yes Khatri, Hina, PA-C  mometasone-formoterol (DULERA) 200-5 MCG/ACT AERO Inhale 2 puffs into the lungs 2 (two) times daily. 08/08/17  Yes Azzie Glatter, FNP  ondansetron (ZOFRAN) 4 MG tablet Take 1 tablet (4 mg total) by mouth every 8 (eight) hours as needed for nausea or vomiting. 08/23/17  Yes Azzie Glatter, FNP    polyethylene glycol powder (GLYCOLAX/MIRALAX) powder Mix 1 capful of powder into drink and take by mouth 1-3 times daily until daily soft stools OTC 08/30/17  Yes Little, Wenda Overland, MD  PROAIR HFA 108 [phone] Base) MCG/ACT inhaler Inhale 1 puff into the lungs every 4 (four) hours as needed for wheezing or shortness of breath.  08/02/17  Yes [provider]  SPIRIVA HANDIHALER 18 MCG inhalation capsule PLACE 1 CAPSULE INTO INHALER AND INHALE DAILY Patient taking differently: Place 18 mcg into inhaler and inhale daily as needed (shortness of breath).  09/08/17  Yes Azzie Glatter, FNP  azithromycin (ZITHROMAX) 250 MG tablet Take 1 tablet (250 mg total) by mouth daily. Take first 2 tablets together, then 1 every day until finished. 09/16/17   Deno Etienne, DO  busPIRone (BUSPAR) 10 MG tablet Take 1 tablet (10 mg total) by mouth 3 (three) times daily. Patient not taking: Reported on 09/15/2017 08/23/17   Azzie Glatter, FNP  cephALEXin (KEFLEX) 500 MG capsule Take 1 capsule (500 mg total) by mouth 4 (four) times daily. 09/16/17   Deno Etienne, DO  hydrOXYzine (ATARAX/VISTARIL) 25 MG tablet Take 1 tablet (25 mg total) by mouth 3 (three) times daily as needed (anxiety). Patient not taking: Reported on 09/15/2017 08/08/17   Azzie Glatter, FNP    Family History Family History  Problem Relation Age of Onset  . CAD Mother   . Alcoholism Father   . COPD Father     Social History Social History   Tobacco Use  . Smoking status: Current Every Day Smoker    Packs/day: 1.50    Years: 30.00    Pack years: 45.00    Types: Cigarettes  . Smokeless tobacco: Never Used  . Tobacco comment: smoking up to 1.5 ppd per husband. down from 3pk day to 1.5   Substance Use Topics  . Alcohol use: Not Currently    Alcohol/week: 0.0 standard drinks    Comment: "I stopped drinking 1 year ago"  . Drug use: No    Comment: Hx of polysubstance drug abuse     Allergies   Aspirin and Chlorpromazine  hcl   Review of Systems Review of Systems  All other systems reviewed and are negative.    Physical Exam Updated Vital Signs BP (!) 90/54 (BP Location: Right Arm)   Pulse 76   Temp 97.9 F (36.6 C) (Oral)   Resp 16   Ht 1.626 m (5\' 4" )   Wt 30.3 kg   SpO2 100%   BMI 11.47 kg/m   Physical Exam  Constitutional: She is oriented to person, place, and time. She appears cachectic. She has a sickly appearance. She appears ill.  HENT:  Head: Normocephalic and atraumatic.  Eyes: Pupils are equal, round, and reactive to light. Conjunctivae, EOM and lids are normal.  Neck: Normal range of motion. Neck supple. No tracheal deviation present. No thyroid mass present.  Cardiovascular: Normal rate, regular rhythm and normal heart sounds. Exam reveals no gallop.  No murmur heard. Pulmonary/Chest: No stridor. Tachypnea noted. She  has decreased breath sounds in the right lower field. She has no wheezes. She has rhonchi in the right lower field. She has no rales.  Abdominal: Soft. Normal appearance and bowel sounds are normal. She exhibits no distension. There is no tenderness. There is no rebound and no CVA tenderness.  Musculoskeletal: Normal range of motion. She exhibits no edema or tenderness.  Neurological: She is alert and oriented to person, place, and time. She has normal strength. No cranial nerve deficit or sensory deficit. GCS eye subscore is 4. GCS verbal subscore is 5. GCS motor subscore is 6.  Skin: Skin is warm and dry. No abrasion and no rash noted.  Psychiatric: She has a normal mood and affect. Her speech is normal and behavior is normal.  Nursing note and vitals reviewed.    ED Treatments / Results  Labs (all labs ordered are listed, but only abnormal results are displayed) Labs Reviewed  CBC WITH DIFFERENTIAL/PLATELET - Abnormal; Notable for the following components:      Result Value   Hemoglobin 11.4 (*)    HCT 35.3 (*)    Platelets 426 (*)    All other components  within normal limits  COMPREHENSIVE METABOLIC PANEL  URINALYSIS, ROUTINE W REFLEX MICROSCOPIC  I-STAT CG4 LACTIC ACID, ED  I-STAT CG4 LACTIC ACID, ED    EKG EKG Interpretation  Date/Time:  Saturday September 17 2017 10:56:31 EDT Ventricular Rate:  92 PR Interval:    QRS Duration: 137 QT Interval:  354 QTC Calculation: 438 R Axis:   82 Text Interpretation:  Sinus rhythm Right atrial enlargement Anterior infarct, old Nonspecific T abnormalities, lateral leads ST elevation, consider inferior injury Artifact in lead(s) I II III aVR aVL aVF V1 V2 V3 V6 Confirmed by Lacretia Leigh ([id number]) on 09/17/2017 11:08:12 AM   Radiology Dg Chest 2 View  Result Date: 09/17/2017 CLINICAL DATA:  Shortness of breath EXAM: CHEST - 2 VIEW COMPARISON:  09/15/2017 FINDINGS: Cardiac shadow is stable. A retrocardiac mass lesion is again identified similar to that seen on the prior exam on the left. Patchy infiltrate is again noted in the right lung base stable from the previous exam. No new focal abnormality is noted. No bony abnormality is seen. IMPRESSION: Stable right lower lobe infiltrate. Stable left lower lobe mass. Electronically Signed   By: Inez Catalina M.D.   On: 09/17/2017 14:22   Dg Chest 2 View  Result Date: 09/15/2017 CLINICAL DATA:  Pt reports being unable to urinate since last night. Pt hx of COPD and normally on 4L. Pt is SOB and O2 sat is 86%. Smoker. EXAM: CHEST - 2 VIEW COMPARISON:  09/04/2017 FINDINGS: Lungs are markedly hyperinflated. The heart size is normal. In the LEFT LOWER lobe, oval mass is estimated to measure 4.1 x 3.0 centimeters, larger compared to prior studies. There is patchy opacity in the RIGHT lung base, consistent with infectious abnormality. No pulmonary edema. IMPRESSION: Increasing size of LEFT LOWER lobe mass. RIGHT LOWER lobe infiltrate. Electronically Signed   By: Nolon Nations M.D.   On: 09/15/2017 16:30    Procedures Procedures (including critical care  time)  Medications Ordered in ED Medications  0.9 %  sodium chloride infusion (1,000 mLs Intravenous New Bag/Given 09/17/17 1324)  azithromycin (ZITHROMAX) 500 mg in sodium chloride 0.9 % 250 mL IVPB (500 mg Intravenous New Bag/Given 09/17/17 1332)  cefTRIAXone (ROCEPHIN) 1 g in sodium chloride 0.9 % 100 mL IVPB (1 g Intravenous New Bag/Given 09/17/17 1329)  Initial Impression / Assessment and Plan / ED Course  I have reviewed the triage vital signs and the nursing notes.  Pertinent labs & imaging results that were available during my care of the patient were reviewed by me and considered in my medical decision making (see chart for details).     Patient given dose of IV antibiotics here.  Slightly low blood pressure treated with IV fluids.  Lactate normal.  Due to patient's worsening weakness and known history of pneumonia which remains on chest x-ray today will consult hospitalist for admission  Final Clinical Impressions(s) / ED Diagnoses   Final diagnoses:  SOB (shortness of breath)    ED Discharge Orders    None       Lacretia Leigh, MD 09/17/17 1448

## 2017-09-17 NOTE — ED Notes (Signed)
I attempted three times to collect blood and was unsuccessful

## 2017-09-17 NOTE — Telephone Encounter (Signed)
Form signed.

## 2017-09-17 NOTE — ED Notes (Signed)
EDP [place]EN MADE AWARE BLOOD CULTURE X 1 OBTAINED. 2ND ATTEMPT NOT SUCCESSFUL. EDP MADE AWARE OF PT'S REQUEST FOR NO ADDITIONAL ATTEMPTS FOR 2ND BLOOD CULTURE. PT REPORTS BLOOD CULTURES OBTAINED 2 DAYS AGO. EDP CANCELLED 2ND BLOOD CULTURE.

## 2017-09-17 NOTE — ED Notes (Signed)
ED TO INPATIENT HANDOFF REPORT  Name/Age/Gender [name] 67 y.o. female  Code Status    Code Status Orders  (From admission, onward)         Start     Ordered   09/17/17 1540  Full code  Continuous     09/17/17 1540        Code Status History    Date Active Date Inactive Code Status Order ID Comments User Context   06/21/2017 2201 06/28/2017 1543 Full Code [id number]  Bethena Roys, MD Inpatient   04/03/2017 1617 04/11/2017 1835 Full Code [id number]  Georgette Shell, MD ED   04/03/2017 1207 04/03/2017 1617 Full Code [id number]  Renita Papa, PA-C ED   02/28/2016 1050 02/29/2016 1439 Full Code [id number]  Annita Brod, MD Inpatient   01/03/2015 1440 01/05/2015 1428 Full Code [id number]  Theodis Blaze, MD Inpatient   07/03/2014 1716 07/05/2014 1540 Full Code [id number]  Cristal Ford, DO Inpatient   06/02/2013 0029 06/06/2013 1456 Full Code [id number]  Etta Quill, DO ED   03/28/2013 1941 04/01/2013 1925 Full Code [id number]  Marybelle Killings, MD Inpatient   03/28/2013 0550 03/28/2013 1941 Full Code [id number]  Berle Mull, MD ED   03/09/2013 2235 03/11/2013 2210 Full Code [id number]  Toy Baker, MD ED   02/09/2013 0247 02/11/2013 1915 Full Code [id number]  Etta Quill, DO ED      Home/SNF/Other Home  Chief Complaint Pneumonia  Level of Care/Admitting Diagnosis ED Disposition    ED Disposition Condition [place]: [facility] [280034]  Level of Care: Med-Surg [16]  Diagnosis: PNA (pneumonia) [917915]  Admitting Physician: Nita Sells [phone]  Attending Physician: Nita Sells [[id number]]  PT Class (Do Not Modify): Observation [104]  PT Acc Code (Do Not Modify): Observation [10022]       Medical History Past Medical History:  Diagnosis Date  . Cancer ([place])    renal ca  . COPD (chronic obstructive pulmonary disease) ([place])   . Drug-seeking behavior   . Pancreatitis   . Pancreatitis   . Seizures  ([place])   . Substance abuse ([facility])     Allergies Allergies  Allergen Reactions  . Aspirin Other (See Comments)    Break out in welts  . Chlorpromazine Hcl Other (See Comments)    Muscle spasms    IV Location/Drains/Wounds Patient Lines/Drains/Airways Status   Active Line/Drains/Airways    Name:   Placement date:   Placement time:   Site:   Days:   Peripheral IV 09/17/17 Left Arm   09/17/17    1250    Arm   less than 1   External Urinary Catheter   09/16/17    1730    -   1          Labs/Imaging Results for orders placed or performed during the hospital encounter of 09/17/17 (from the past 48 hour(s))  I-Stat CG4 Lactic Acid, ED  (not at  [facility])     Status: None   Collection Time: 09/17/17  1:04 PM  Result Value Ref Range   Lactic Acid, Venous 1.33 0.5 - 1.9 mmol/L  Comprehensive metabolic panel     Status: None   Collection Time: 09/17/17  1:13 PM  Result Value Ref Range   Sodium 142 135 - 145 mmol/L   Potassium 3.8 3.5 - 5.1 mmol/L   Chloride 105 98 - 111 mmol/L   CO2 27 22 - 32 mmol/L  Glucose, Bld 90 70 - 99 mg/dL   BUN 18 8 - 23 mg/dL   Creatinine, Ser 0.78 0.44 - 1.00 mg/dL   Calcium 9.5 8.9 - 10.3 mg/dL   Total Protein 7.2 6.5 - 8.1 g/dL   Albumin 3.5 3.5 - 5.0 g/dL   AST 16 15 - 41 U/L   ALT 11 0 - 44 U/L   Alkaline Phosphatase 89 38 - 126 U/L   Total Bilirubin 0.4 0.3 - 1.2 mg/dL   GFR calc non Af Amer >60 >60 mL/min   GFR calc Af Amer >60 >60 mL/min    Comment: (NOTE) The eGFR has been calculated using the CKD EPI equation. This calculation has not been validated in all clinical situations. eGFR's persistently <60 mL/min signify possible Chronic Kidney Disease.    Anion gap 10 5 - 15    Comment: Performed at [facility], [place] [address], [place], [place] [address]  CBC WITH DIFFERENTIAL     Status: Abnormal   Collection Time: 09/17/17  1:13 PM  Result Value Ref Range   WBC 8.8 4.0 - 10.5 K/uL   RBC 4.02 3.87 - 5.11 MIL/uL    Hemoglobin 11.4 (L) 12.0 - 15.0 g/dL   HCT 35.3 (L) 36.0 - 46.0 %   MCV 87.8 78.0 - 100.0 fL   MCH 28.4 26.0 - 34.0 pg   MCHC 32.3 30.0 - 36.0 g/dL   RDW 14.7 11.5 - 15.5 %   Platelets 426 (H) 150 - 400 K/uL   Neutrophils Relative % 75 %   Neutro Abs 6.6 1.7 - 7.7 K/uL   Lymphocytes Relative 17 %   Lymphs Abs 1.5 0.7 - 4.0 K/uL   Monocytes Relative 5 %   Monocytes Absolute 0.5 0.1 - 1.0 K/uL   Eosinophils Relative 2 %   Eosinophils Absolute 0.2 0.0 - 0.7 K/uL   Basophils Relative 1 %   Basophils Absolute 0.0 0.0 - 0.1 K/uL    Comment: Performed at [facility] [place] [facility], [place] [address] [place], [place] [address]   Dg Chest 2 View  Result Date: 09/17/2017 CLINICAL DATA:  Shortness of breath EXAM: CHEST - 2 VIEW COMPARISON:  09/15/2017 FINDINGS: Cardiac shadow is stable. A retrocardiac mass lesion is again identified similar to that seen on the prior exam on the left. Patchy infiltrate is again noted in the right lung base stable from the previous exam. No new focal abnormality is noted. No bony abnormality is seen. IMPRESSION: Stable right lower lobe infiltrate. Stable left lower lobe mass. Electronically Signed   By: Inez Catalina M.D.   On: 09/17/2017 14:22   Dg Chest 2 View  Result Date: 09/15/2017 CLINICAL DATA:  Pt reports being unable to urinate since last night. Pt hx of COPD and normally on 4L. Pt is SOB and O2 sat is 86%. Smoker. EXAM: CHEST - 2 VIEW COMPARISON:  09/04/2017 FINDINGS: Lungs are markedly hyperinflated. The heart size is normal. In the LEFT LOWER lobe, oval mass is estimated to measure 4.1 x 3.0 centimeters, larger compared to prior studies. There is patchy opacity in the RIGHT lung base, consistent with infectious abnormality. No pulmonary edema. IMPRESSION: Increasing size of LEFT LOWER lobe mass. RIGHT LOWER lobe infiltrate. Electronically Signed   By: Nolon Nations M.D.   On: 09/15/2017 16:30    Pending Labs Unresulted Labs (From admission, onward)     Start     Ordered   09/24/17 0500  Creatinine, serum  (enoxaparin (  LOVENOX)    CrCl >/= 30 ml/min)  Weekly,   R    Comments:  while on enoxaparin therapy    09/17/17 1540   09/17/17 1539  HIV antibody (Routine Screening)  Once,   R     09/17/17 1540   09/17/17 1539  Strep pneumoniae urinary antigen  Once,   R     09/17/17 1540   09/17/17 1539  CBC  (enoxaparin (LOVENOX)    CrCl >/= 30 ml/min)  Once,   R    Comments:  Baseline for enoxaparin therapy IF NOT ALREADY DRAWN.  Notify MD if PLT < 100 K.    09/17/17 1540   09/17/17 1539  Creatinine, serum  (enoxaparin (LOVENOX)    CrCl >/= 30 ml/min)  Once,   R    Comments:  Baseline for enoxaparin therapy IF NOT ALREADY DRAWN.    09/17/17 1540   09/17/17 1052  Urinalysis, Routine w reflex microscopic  STAT,   STAT     09/17/17 1052          Vitals/Pain Today's Vitals   09/17/17 1432 09/17/17 1434 09/17/17 1456 09/17/17 1530  BP: (!) 90/54 (!) 90/54 (!) 112/93 133/82  Pulse: (!) 105 76 73 74  Resp: _0 Temp:      TempSrc:      SpO2: 97% 100% 96% 100%  Weight:      Height:      PainSc:        Isolation Precautions No active isolations  Medications Medications  0.9 %  sodium chloride infusion (1,000 mLs Intravenous New Bag/Given 09/17/17 1324)  cefTRIAXone (ROCEPHIN) 1 g in sodium chloride 0.9 % 100 mL IVPB (1 g Intravenous New Bag/Given 09/17/17 1329)  hydrOXYzine (ATARAX/VISTARIL) tablet 25 mg (has no administration in time range)  loperamide (IMODIUM) capsule 2-4 mg (has no administration in time range)  methocarbamol (ROBAXIN) tablet 500 mg (has no administration in time range)  ondansetron (ZOFRAN-ODT) disintegrating tablet 4 mg (has no administration in time range)  cloNIDine (CATAPRES) tablet 0.1 mg (has no administration in time range)    Followed by  cloNIDine (CATAPRES) tablet 0.1 mg (has no administration in time range)    Followed by  cloNIDine (CATAPRES) tablet 0.1 mg (has no administration in time  range)  enoxaparin (LOVENOX) injection 40 mg (has no administration in time range)  mometasone-formoterol (DULERA) 200-5 MCG/ACT inhaler 2 puff (has no administration in time range)  tiotropium (SPIRIVA) inhalation capsule 18 mcg (has no administration in time range)  azithromycin (ZITHROMAX) 500 mg in sodium chloride 0.9 % 250 mL IVPB (has no administration in time range)    Mobility walks with person assist

## 2017-09-17 NOTE — Progress Notes (Signed)
A consult was received from an ED physician for Cefepime per pharmacy dosing.  The patient's profile has been reviewed for ht/wt/allergies/indication/available labs.   A one time order has been placed for Cefepime 2g IV.  Further antibiotics/pharmacy consults should be ordered by admitting physician if indicated.                       Thank you, Lolita Patella 09/17/2017  10:59 AM
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
## 2017-09-17 NOTE — Progress Notes (Addendum)
Patient arrived to unit with immediate c/o pain all over and not being able to void for 2 days.Orders for cardiac monitoring are active.  I have paged the flow manager to determine which MD I am to notify to make aware of the above information. Dr Verlon Au returned call and made aware of above, new orders received.

## 2017-09-17 NOTE — ED Notes (Signed)
2BD ATTEMPT FOR BLOOD CULTURE BY PHELB. TRANSDRA. NOT SUCCESSFUL.

## 2017-09-18 ENCOUNTER — Encounter (HOSPITAL_COMMUNITY): Payer: Self-pay | Admitting: *Deleted

## 2017-09-18 ENCOUNTER — Observation Stay (HOSPITAL_COMMUNITY): Payer: Medicaid Other

## 2017-09-18 DIAGNOSIS — R0602 Shortness of breath: Secondary | ICD-10-CM | POA: Diagnosis not present

## 2017-09-18 LAB — CBC
HEMATOCRIT: 26.3 % — AB (ref 36.0–46.0)
Hemoglobin: 8.5 g/dL — ABNORMAL LOW (ref 12.0–15.0)
MCH: 28.2 pg (ref 26.0–34.0)
MCHC: 32.3 g/dL (ref 30.0–36.0)
MCV: 87.4 fL (ref 78.0–100.0)
PLATELETS: 275 10*3/uL (ref 150–400)
RBC: 3.01 MIL/uL — ABNORMAL LOW (ref 3.87–5.11)
RDW: 15.1 % (ref 11.5–15.5)
WBC: 5.1 10*3/uL (ref 4.0–10.5)

## 2017-09-18 LAB — BASIC METABOLIC PANEL
ANION GAP: 7 (ref 5–15)
BUN: 11 mg/dL (ref 8–23)
CO2: 24 mmol/L (ref 22–32)
CREATININE: 0.7 mg/dL (ref 0.44–1.00)
Calcium: 8.3 mg/dL — ABNORMAL LOW (ref 8.9–10.3)
Chloride: 112 mmol/L — ABNORMAL HIGH (ref 98–111)
Glucose, Bld: 86 mg/dL (ref 70–99)
Potassium: 4 mmol/L (ref 3.5–5.1)
SODIUM: 143 mmol/L (ref 135–145)

## 2017-09-18 LAB — HIV ANTIBODY (ROUTINE TESTING W REFLEX): HIV Screen 4th Generation wRfx: NONREACTIVE

## 2017-09-18 MED ORDER — ORAL CARE MOUTH RINSE
15.0000 mL | Freq: Two times a day (BID) | OROMUCOSAL | Status: DC
  Administered 2017-09-18 – 2017-09-22 (×6): 15 mL via OROMUCOSAL

## 2017-09-18 MED ORDER — LORAZEPAM 2 MG/ML IJ SOLN
1.0000 mg | Freq: Once | INTRAMUSCULAR | Status: AC
  Administered 2017-09-18: 1 mg via INTRAVENOUS
  Filled 2017-09-18: qty 1

## 2017-09-18 MED ORDER — SODIUM CHLORIDE 0.9 % IV BOLUS
500.0000 mL | Freq: Once | INTRAVENOUS | Status: AC
  Administered 2017-09-18: 500 mL via INTRAVENOUS

## 2017-09-18 MED ORDER — TRAMADOL HCL 50 MG PO TABS
50.0000 mg | ORAL_TABLET | Freq: Three times a day (TID) | ORAL | Status: DC | PRN
  Administered 2017-09-18 – 2017-09-19 (×2): 50 mg via ORAL
  Filled 2017-09-18 (×2): qty 1

## 2017-09-18 MED ORDER — INFLUENZA VAC SPLIT QUAD 0.5 ML IM SUSY
0.5000 mL | PREFILLED_SYRINGE | INTRAMUSCULAR | Status: AC
  Administered 2017-09-19: 0.5 mL via INTRAMUSCULAR
  Filled 2017-09-18: qty 0.5

## 2017-09-18 MED ORDER — LORAZEPAM 0.5 MG PO TABS
0.5000 mg | ORAL_TABLET | Freq: Three times a day (TID) | ORAL | Status: DC | PRN
  Administered 2017-09-18 – 2017-09-24 (×17): 0.5 mg via ORAL
  Filled 2017-09-18 (×17): qty 1

## 2017-09-18 MED ORDER — MAGNESIUM CITRATE PO SOLN
1.0000 | Freq: Once | ORAL | Status: DC
  Filled 2017-09-18: qty 296

## 2017-09-18 MED ORDER — IOHEXOL 300 MG/ML  SOLN
75.0000 mL | Freq: Once | INTRAMUSCULAR | Status: AC | PRN
  Administered 2017-09-18: 75 mL via INTRAVENOUS

## 2017-09-18 NOTE — Progress Notes (Signed)
Patient reporting that she would like to speak to someone higher up regarding management of pain and anxiety.  Patient reports she feels like no one is listening to her.  She says she feels miserable and is in extreme pain.  She also reports severe anxiety unrelieved by ordered medication.  Night hospitalist and administrative coordinator made aware.Roderick Pee

## 2017-09-18 NOTE — Progress Notes (Signed)
[facility]                                                                              Patient Demographics  [name], is a 67 y.o. female, DOB - Nov 10, 1950, XBL:[id number]  Admit date - 09/17/2017   Admitting Physician Nita Sells, MD  Outpatient Primary MD for the patient is Azzie Glatter, FNP  Outpatient specialists:   LOS - 0  days   Medical records reviewed and are as summarized below:    Chief Complaint  Patient presents with  . Shortness of Breath       Brief summary   Patient is a 67 year old female with chronic pancreatitis, substance abuse, COPD, history of renal CA, chronic pancreatitis presented to ED with shortness of breath and inability to take oral antibiotics.  Per patient she could not eat over the last few days and her nose for bothering her, she was not able to take oral antibiotics that were prescribed.  Normally she is on 4 L of O2 at home.   Assessment & Plan    Community acquired pneumonia -Patient reports that she was not able to swallow the pills -Blood cultures from 9/13 with no growth and prior blood cultures from 8/27 with no growth -For now continue IV Zithromax and IV Rocephin  Urinary tract infection -Follow urine culture and sensitivities, continue IV Rocephin  History of left renal CA status post cryoablation and recurrence in 2012, left lower lobe pulmonary nodule -Discussed with Dr. Julien Nordmann, recommended CT chest, outpatient PET/CT has been ordered on 9/23 next week -CT chest showed left lower lobe mass 3.1 cm almost certainly neoplastic could represent primary bronchogenic carcinoma or metastatic disease, patchy consolidation of the bilateral lung bases most likely pneumonia and/or aspiration -New compression fracture deformity involving the superior endplate T7 vertebral body acute to subacute, possibly pathological fracture -Per Dr. Julien Nordmann, outpatient follow-up for the pulmonary nodule  Acute on  chronic pain with drug-seeking behavior in the setting of substance abuse history -Per patient, she has been seen by pain management [facility], was on Suboxone and then she stopped as "it did not work".  She states that she went to [facility] care and was told to come to ER for pain medications.  Records reviewed from her PCP and oncology, Dr. Julien Nordmann and patient has a prior history of drug-seeking behavior -I relayed to the patient that I will not be starting her on any oral or IV opiates.  She is already an established patient at the pain clinic, I will contact her pain management clinic in a.m. to corroborate her statements.  -Continue current management  Acute anxiety -Patient received IV Ativan 1 mg last night, states Atarax is not helping her, shaking in her bed crying that she needs "something for her nerves" -States that she has taken Ativan 1 mg 3 times daily for last 30 years.  -Placed on low-dose Ativan 0.5 mg oral every 8 hours as needed, relayed to the patient that I will not be discharging her on benzodiazepines.  She will need to follow-up with her psychiatrist and pain management clinic or her PCP.  Severe protein calorie malnutrition -Nutrition consult, placed on supplement   Code Status: Full CODE STATUS DVT Prophylaxis:  Lovenox  Family Communication: Discussed in detail with the patient, all imaging results, lab results explained to the patient.    Disposition Plan: Follow blood cultures, urine culture.  Will discuss with her pain management clinic in a.m., likely DC after that  Time Spent in minutes 35  Procedures:  None  Consultants:   Discussed with Dr. Julien Nordmann on phone  Antimicrobials:      Medications  Scheduled Meds: . cloNIDine  0.1 mg Oral QID   Followed by  . [START ON 09/19/2017] cloNIDine  0.1 mg Oral BH-qamhs   Followed by  . [START ON 09/22/2017] cloNIDine  0.1 mg Oral QAC breakfast  . enoxaparin (LOVENOX) injection  30 mg Subcutaneous Q24H  .  [START ON 09/19/2017] Influenza vac split quadrivalent PF  0.5 mL Intramuscular Tomorrow-1000  . mouth rinse  15 mL Mouth Rinse BID  . mirabegron ER  25 mg Oral Daily  . mometasone-formoterol  2 puff Inhalation BID  . tamsulosin  0.4 mg Oral Daily   Continuous Infusions: . sodium chloride 125 mL/hr at 09/18/17 0646  . azithromycin    . cefTRIAXone (ROCEPHIN)  IV 1 g (09/18/17 1255)   PRN Meds:.loperamide, LORazepam, methocarbamol, ondansetron, tiotropium   Antibiotics   Anti-infectives (From admission, onward)   Start     Dose/Rate Route Frequency Ordered Stop   09/18/17 1400  azithromycin (ZITHROMAX) 500 mg in sodium chloride 0.9 % 250 mL IVPB     500 mg 250 mL/hr over 60 Minutes Intravenous Every 24 hours 09/17/17 1547     09/17/17 1130  cefTRIAXone (ROCEPHIN) 1 g in sodium chloride 0.9 % 100 mL IVPB     1 g 200 mL/hr over 30 Minutes Intravenous Every 24 hours 09/17/17 1115     09/17/17 1100  ceFEPIme (MAXIPIME) 2 g in sodium chloride 0.9 % 100 mL IVPB  Status:  Discontinued     2 g 200 mL/hr over 30 Minutes Intravenous  Once 09/17/17 1054 09/17/17 1115   09/17/17 1100  azithromycin (ZITHROMAX) 500 mg in sodium chloride 0.9 % 250 mL IVPB  Status:  Discontinued     500 mg 250 mL/hr over 60 Minutes Intravenous Every 24 hours 09/17/17 1054 09/17/17 1559        Subjective:   Sanaii Caporaso was seen and examined today.  Anxious and tearful, states her pain is not controlled, feeling anxious.  No fevers or chills, no coughing abdominal pain nausea vomiting.  However states that her whole body is sore.     Objective:   Vitals:   09/17/17 2021 09/17/17 2214 09/18/17 0612 09/18/17 1241  BP: 132/68  128/72 (!) 95/56  Pulse: 80  70 70  Resp: 18  16 18   Temp: 98 F (36.7 C)  98.2 F (36.8 C) 98.6 F (37 C)  TempSrc: Oral  Oral Oral  SpO2: 100% 99% 96% 100%  Weight:      Height:        Intake/Output Summary (Last 24 hours) at 09/18/2017 1411 Last data filed at 09/18/2017  1105 Gross per 24 hour  Intake 2337.52 ml  Output 1400 ml  Net 937.52 ml     Wt Readings from Last 3 Encounters:  09/17/17 33.1 kg  09/15/17 30.4 kg  09/13/17 30.8 kg     Exam  General: Anxious and tearful, sick appearing, cachectic  Eyes:   HEENT:  Atraumatic,  normocephalic  Cardiovascular: S1 S2 auscultated,Regular rate and rhythm.  Respiratory: Clear to auscultation bilaterally, no wheezing, rales or rhonchi  Gastrointestinal: Soft, nontender, nondistended, + bowel sounds  Ext: no pedal edema bilaterally  Neuro: no new deficits  Musculoskeletal: No digital cyanosis, clubbing  Skin: No rashes  Psych: oriented x3, anxious   Data Reviewed:  I have personally reviewed following labs and imaging studies  Micro Results Recent Results (from the past 240 hour(s))  Blood culture (routine x 2)     Status: None (Preliminary result)   Collection Time: 09/16/17  5:20 PM  Result Value Ref Range Status   Specimen Description   Final    BLOOD RIGHT ANTECUBITAL Performed at [facility] [address] [place], [place] [address]    Special Requests   Final    BOTTLES DRAWN AEROBIC AND ANAEROBIC Blood Culture results may not be optimal due to an inadequate volume of blood received in culture bottles Performed at [facility] [address] [place], [place] [address]    Culture   Final    NO GROWTH < 24 HOURS Performed at [facility] [address] [place], [place] [address]    Report Status PENDING  Incomplete  Blood culture (routine x 2)     Status: None (Preliminary result)   Collection Time: 09/16/17  6:15 PM  Result Value Ref Range Status   Specimen Description   Final    BLOOD RIGHT ARM Performed at [facility] [address], [place], [place] [address]    Special Requests   Final    BOTTLES DRAWN AEROBIC AND ANAEROBIC Blood Culture adequate volume Performed at [facility] [address] [place], [place] [address]    Culture   Final    NO GROWTH < 24 HOURS Performed at [facility] [address] [place],  [address]    Report Status PENDING  Incomplete    Radiology Reports Dg Chest 2 View  Result Date: 09/17/2017 CLINICAL DATA:  Shortness of breath EXAM: CHEST - 2 VIEW COMPARISON:  09/15/2017 FINDINGS: Cardiac shadow is stable. A retrocardiac mass lesion is again identified similar to that seen on the prior exam on the left. Patchy infiltrate is again noted in the right lung base stable from the previous exam. No new focal abnormality is noted. No bony abnormality is seen. IMPRESSION: Stable right lower lobe infiltrate. Stable left lower lobe mass. Electronically Signed   By: Inez Catalina M.D.   On: 09/17/2017 14:22   Dg Chest 2 View  Result Date: 09/15/2017 CLINICAL DATA:  Pt reports being unable to urinate since last night. Pt hx of COPD and normally on 4L. Pt is SOB and O2 sat is 86%. Smoker. EXAM: CHEST - 2 VIEW COMPARISON:  09/04/2017 FINDINGS: Lungs are markedly hyperinflated. The heart size is normal. In the LEFT LOWER lobe, oval mass is estimated to measure 4.1 x 3.0 centimeters, larger compared to prior studies. There is patchy opacity in the RIGHT lung base, consistent with infectious abnormality. No pulmonary edema. IMPRESSION: Increasing size of LEFT LOWER lobe mass. RIGHT LOWER lobe infiltrate. Electronically Signed   By: Nolon Nations M.D.   On: 09/15/2017 16:30   Dg Chest 2 View  Result Date: 09/04/2017 CLINICAL DATA:  Patient with productive cough EXAM: CHEST - 2 VIEW COMPARISON:  Chest radiograph 09/03/2017 FINDINGS: Stable cardiac and mediastinal contours. Re-demonstrated 3.4 cm mass projecting over the posterior  left lower lung. Pulmonary hyperinflation. Emphysematous change. No large area pulmonary consolidation. No pleural effusion or pneumothorax. Thoracic spine degenerative changes. IMPRESSION: There is a 3.4 cm  mass projecting over the medial left lower lung. This needs dedicated evaluation with contrast-enhanced chest CT. Considerations include primary pulmonary malignancy or metastatic disease. Electronically Signed   By: Lovey Newcomer M.D.   On: 09/04/2017 18:23   Dg Chest 2 View  Result Date: 09/03/2017 CLINICAL DATA:  Diffuse pain. EXAM: CHEST - 2 VIEW COMPARISON:  PA and lateral chest 08/30/2017. CT chest, abdomen and pelvis 01/15/2017. FINDINGS: The lungs are markedly emphysematous but clear. Heart size is normal. No pneumothorax or pleural effusion. No acute or focal bony abnormality. IMPRESSION: Severe emphysema without acute disease. Electronically Signed   By: Inge Rise M.D.   On: 09/03/2017 12:58   Dg Chest 2 View  Result Date: 08/30/2017 CLINICAL DATA:  Cough and COPD EXAM: CHEST - 2 VIEW COMPARISON:  08/28/2017 FINDINGS: The lungs are hyperinflated with diffuse interstitial prominence. No focal airspace consolidation or pulmonary edema. No pleural effusion or pneumothorax. Normal cardiomediastinal contours. Unchanged appearance of left paraspinous mass. IMPRESSION: 1. COPD without focal airspace disease. 2. Unchanged appearance of left paramedian mass. Electronically Signed   By: Ulyses Jarred M.D.   On: 08/30/2017 18:53   Ct Chest W Contrast  Result Date: 09/18/2017 CLINICAL DATA:  LEFT lower lobe pulmonary mass. Rib pain. History of LEFT renal clear cell carcinoma in 2009, status post cryoablation of cancer recurrence in 2012. EXAM: CT CHEST WITH CONTRAST TECHNIQUE: Multidetector CT imaging of the chest was performed during intravenous contrast administration. CONTRAST:  93mL OMNIPAQUE IOHEXOL 300 MG/ML  SOLN COMPARISON:  CT chest dated 01/15/2017. Chest x-ray dated 09/17/2017. FINDINGS: Cardiovascular: No thoracic aortic aneurysm or evidence of aortic dissection. Aortic atherosclerosis. Heart size is normal. Diffuse coronary artery calcifications. Mediastinum/Nodes: Scattered small lymph  nodes within the mediastinum and perihilar regions. No mass or enlarged lymph nodes seen. Esophagus is unremarkable. Small amount of debris within the trachea and central bronchi. Lungs/Pleura: Mass within the medial aspects of the LEFT lower lobe measures 3.1 x 2.8 cm, corresponding to findings on recent chest x-rays, new compared to the earlier chest CT of 01/15/2017, highly suggestive of neoplastic mass. Patchy consolidations within the lower lobes bilaterally, dependent aspects, new compared to the earlier chest CT, stable or increasing compared to recent chest x-rays. Small bilateral pleural effusions. Mild emphysematous changes at the lung apices. Upper Abdomen: No acute findings on limited images of the upper abdomen. Musculoskeletal: New compression fracture deformity involving the superior endplate of the T7 vertebral body, likely acute to subacute, suspected pathologic fracture. No other acute or suspicious osseous finding. IMPRESSION: 1. LEFT lower lobe mass measures 3.1 cm, almost certainly neoplastic. This could represent primary bronchogenic carcinoma or metastatic disease. Recommend tissue sampling. 2. Patchy consolidations at the bilateral lung bases, most likely pneumonia and/or aspiration. 3. Small bilateral pleural effusions. 4. New compression fracture deformity involving the superior endplate of the T7 vertebral body, approximately 30% compressed anteriorly and centrally, acute to subacute in age, suspected pathologic fracture. No associated vertebral body retropulsion or displacement. 5. Diffuse coronary artery calcifications. Aortic Atherosclerosis (ICD10-I70.0) and Emphysema (ICD10-J43.9). Electronically Signed   By: Franki Cabot M.D.   On: 09/18/2017 12:34   Ct Abdomen Pelvis W Contrast  Result Date: 08/22/2017 CLINICAL DATA:  Acute abdominal pain. Patient reports back and leg pain. Decreased appetite. EXAM: CT ABDOMEN AND PELVIS WITH CONTRAST TECHNIQUE: Multidetector CT  imaging of the  abdomen and pelvis was performed using the standard protocol following bolus administration of intravenous contrast. CONTRAST:  75mL OMNIPAQUE IOHEXOL 300 MG/ML  SOLN COMPARISON:  Chest, abdomen, and pelvis CT 01/15/2017, 02/05/2017, prior chest radiographs FINDINGS: Lower chest: Pulmonary nodule with central low density in the left lower lobe measures 2.5 cm, only partially included in the field of view. This abuts the pleura posteriorly. This likely represents increase size of a 3 mm nodule on prior exam. Hepatobiliary: Unchanged intra and extrahepatic biliary ductal dilatation, common bile duct measuring approximately 15 mm. A calcified gallstone or visualized choledocholithiasis. Gallbladder is partially distended. No focal hepatic lesion. Pancreas: Atrophic with parenchyma not well visualized. No peripancreatic inflammation. Spleen: Normal in size without focal abnormality. Adrenals/Urinary Tract: No adrenal nodule. Stable appearance of post ablation defect in the lower left kidney with dystrophic calcification. No hydronephrosis. No perinephric edema. No new renal lesion allowing for mild motion artifact. Urinary bladder is physiologically distended, no bladder wall thickening. Stomach/Bowel: Paucity of intra-abdominal fat limits detailed bowel evaluation. Small hiatal hernia. Fluid in the stomach with equivocal gastric wall thickening. Surgical clips along the lesser curvature of stomach. No bowel obstruction, evidence of wall thickening or inflammatory change. Appendix not visualized. Vascular/Lymphatic: Advanced aortic and branch atherosclerosis. Atherosclerotic calcification of the origin of both renal arteries. No acute vascular findings. No bulky adenopathy, evaluation for adenopathy is limited due to paucity of intra-abdominal fat. Reproductive: Uterus not definitively visualized.  No adnexal mass. Other: No free air or free fluid. Subcutaneous calcifications in the left greater than right flank,  unchanged and incidental. Musculoskeletal: Bones are under mineralized. Degenerative disc disease throughout the lumbar spine. No blastic osseous lesion. Evaluation for lytic lesions limited given underlying bony under mineralization. Bilateral hip pinning. IMPRESSION: 1. Fluid in the stomach with possible gastric enhancement, may represent gastritis. 2. Partially included 2.5 cm left lower lobe pulmonary nodule, retrospectively increasing since CT 6 months prior were is faintly visualized. This may represent metastasis given history of renal cancer versus primary bronchogenic malignancy. Recommend complete chest evaluation with dedicated chest CT, this could be performed on a nonemergent basis based on patient's symptoms. 3. Chronic biliary dilatation, unchanged from prior exams. 4.  Aortic Atherosclerosis (ICD10-I70.0). Electronically Signed   By: Jeb Levering M.D.   On: 08/22/2017 22:26   Dg Abdomen Acute W/chest  Result Date: 08/28/2017 CLINICAL DATA:  Epigastric pain. EXAM: DG ABDOMEN ACUTE W/ 1V CHEST COMPARISON:  CT of the abdomen and pelvis 08/22/2017 FINDINGS: The heart size is normal. Atherosclerotic calcifications are present at the aortic arch. Emphysematous changes are noted. The left paraspinous 2.5 cm mass is again noted. Bowel gas pattern is normal. There is no free air. No obstruction is present. Postsurgical changes are present at both hips. IMPRESSION: 1. No acute cardiopulmonary disease. 2. Left paraspinous mass. 3. Normal bowel gas pattern. No acute or focal lesion to explain epigastric pain. Electronically Signed   By: San Morelle M.D.   On: 08/28/2017 15:37   Ct Renal Stone Study  Result Date: 08/30/2017 CLINICAL DATA:  Recurring urinary tract infection. History of substance abuse, pancreatitis, renal cell carcinoma. EXAM: CT ABDOMEN AND PELVIS WITHOUT CONTRAST TECHNIQUE: Multidetector CT imaging of the abdomen and pelvis was performed following the standard protocol  without IV contrast. COMPARISON:  CT abdomen and pelvis August 22, 2017 FINDINGS: Moderate respiratory motion degraded examination. LOWER CHEST: Lung bases are clear. The visualized heart size is normal. Small pericardial effusion. HEPATOBILIARY: Negative liver. Similar  mild biliary dilatation better demonstrated on recent contrast-enhanced CT. No gallbladder distension or definite cholelithiasis though limited by motion. PANCREAS: Normal. SPLEEN: Punctate splenic granuloma. ADRENALS/URINARY TRACT: Kidneys are orthotopic, demonstrating normal size and morphology. Partially calcified 13 mm LEFT lower pole mass at site of prior ablation. No nephrolithiasis, hydronephrosis; limited assessment for renal masses by nonenhanced CT. The unopacified ureters are normal in course and caliber. Urinary bladder is partially distended and unremarkable. Normal adrenal glands. STOMACH/BOWEL: The stomach, small and large bowel are normal in course and caliber without inflammatory changes, sensitivity decreased by lack of enteric contrast. Surgical suture material at gastric antrum. Mild stool distended rectum with moderate amount of retained large bowel stool. Normal appendix. VASCULAR/LYMPHATIC: Aortoiliac vessels are normal in course and caliber. Severe calcific atherosclerosis. No lymphadenopathy by CT size criteria. REPRODUCTIVE: Status post hysterectomy. OTHER: No intraperitoneal free fluid or free air. MUSCULOSKELETAL: Non-acute. Streak artifact from bilateral femoral neck pinning. Cachexia. Osteopenia. Grade 1 L2-3 retrolisthesis. IMPRESSION: 1. Moderately motion degraded examination. 2. No urolithiasis or obstructive uropathy. Post treatment changes LEFT kidney. 3. Moderate amount of retained large bowel stool without bowel obstruction. Aortic Atherosclerosis (ICD10-I70.0). Electronically Signed   By: Elon Alas M.D.   On: 08/30/2017 19:08    Lab Data:  CBC: Recent Labs  Lab 09/13/17 1333  09/15/17 1509  09/16/17 1725 09/16/17 1750 09/17/17 1313 09/17/17 1614 09/18/17 0802  WBC 15.0*  --  12.0*  --  10.8* 8.8 7.5 5.1  NEUTROABS 11.6*  --  9.6*  --  8.4* 6.6  --   --   HGB 12.2   < > 12.7 12.2 12.0 11.4* 8.6* 8.5*  HCT 37.3  --  38.4 36.0 36.3 35.3* 26.9* 26.3*  MCV 88.0  --  87.5  --  87.9 87.8 87.9 87.4  PLT 393  --  499*  --  423* 426* 325 275   < > = values in this interval not displayed.   Basic Metabolic Panel: Recent Labs  Lab 09/13/17 1333  09/15/17 1509 09/16/17 1725 09/16/17 1750 09/17/17 1313 09/17/17 1614 09/18/17 0802  NA 138  --  141 136 142 142  --  143  K 4.5  --  4.3 5.3* 4.5 3.8  --  4.0  CL 100  --  102 103 103 105  --  112*  CO2 25  --  28  --  27 27  --  24  GLUCOSE 150*  --  102* 69* 77 90  --  86  BUN 12  --  19 26* 21 18  --  11  CREATININE 0.89   < > 0.90 1.00 0.91 0.78 0.69 0.70  CALCIUM 10.4*  --  10.0  --  9.8 9.5  --  8.3*   < > = values in this interval not displayed.   GFR: Estimated Creatinine Clearance: 37.1 mL/min (by C-G formula based on SCr of 0.7 mg/dL). Liver Function Tests: Recent Labs  Lab 09/13/17 1333 09/17/17 1313  AST 10* 16  ALT 6 11  ALKPHOS 123 89  BILITOT 0.5 0.4  PROT 7.9 7.2  ALBUMIN 3.2* 3.5   No results for input(s): LIPASE, AMYLASE in the last 168 hours. No results for input(s): AMMONIA in the last 168 hours. Coagulation Profile: No results for input(s): INR, PROTIME in the last 168 hours. Cardiac Enzymes: No results for input(s): CKTOTAL, CKMB, CKMBINDEX, TROPONINI in the last 168 hours. BNP (last 3 results) No results for input(s): PROBNP in the last 8760  hours. HbA1C: No results for input(s): HGBA1C in the last 72 hours. CBG: No results for input(s): GLUCAP in the last 168 hours. Lipid Profile: No results for input(s): CHOL, HDL, LDLCALC, TRIG, CHOLHDL, LDLDIRECT in the last 72 hours. Thyroid Function Tests: No results for input(s): TSH, T4TOTAL, FREET4, T3FREE, THYROIDAB in the last 72  hours. Anemia Panel: No results for input(s): VITAMINB12, FOLATE, FERRITIN, TIBC, IRON, RETICCTPCT in the last 72 hours. Urine analysis:    Component Value Date/Time   COLORURINE AMBER (A) 09/17/2017 1912   APPEARANCEUR CLOUDY (A) 09/17/2017 1912   LABSPEC 1.019 09/17/2017 1912   PHURINE 5.0 09/17/2017 1912   GLUCOSEU NEGATIVE 09/17/2017 1912   HGBUR MODERATE (A) 09/17/2017 1912   BILIRUBINUR NEGATIVE 09/17/2017 1912   BILIRUBINUR negative 06/03/2017 1120   KETONESUR NEGATIVE 09/17/2017 1912   PROTEINUR NEGATIVE 09/17/2017 1912   UROBILINOGEN 0.2 06/03/2017 1120   UROBILINOGEN 0.2 03/15/2017 1004   NITRITE NEGATIVE 09/17/2017 1912   LEUKOCYTESUR SMALL (A) 09/17/2017 1912     Ripudeep Rai M.D. [facility] 09/18/2017, 2:11 PM  Pager: [phone] Between 7am to 7pm - call Pager - [phone]  After 7pm go to [email or web address] - password [email or web address]  Call night coverage person covering after 7pm

## 2017-09-19 ENCOUNTER — Observation Stay (HOSPITAL_COMMUNITY): Payer: Medicaid Other

## 2017-09-19 ENCOUNTER — Encounter (HOSPITAL_COMMUNITY): Payer: Self-pay | Admitting: *Deleted

## 2017-09-19 DIAGNOSIS — Z23 Encounter for immunization: Secondary | ICD-10-CM | POA: Diagnosis not present

## 2017-09-19 DIAGNOSIS — K861 Other chronic pancreatitis: Secondary | ICD-10-CM | POA: Diagnosis present

## 2017-09-19 DIAGNOSIS — N390 Urinary tract infection, site not specified: Secondary | ICD-10-CM | POA: Diagnosis not present

## 2017-09-19 DIAGNOSIS — J440 Chronic obstructive pulmonary disease with acute lower respiratory infection: Secondary | ICD-10-CM | POA: Diagnosis present

## 2017-09-19 DIAGNOSIS — G8929 Other chronic pain: Secondary | ICD-10-CM | POA: Diagnosis present

## 2017-09-19 DIAGNOSIS — S22060A Wedge compression fracture of T7-T8 vertebra, initial encounter for closed fracture: Secondary | ICD-10-CM

## 2017-09-19 DIAGNOSIS — M8448XA Pathological fracture, other site, initial encounter for fracture: Secondary | ICD-10-CM | POA: Diagnosis present

## 2017-09-19 DIAGNOSIS — E43 Unspecified severe protein-calorie malnutrition: Secondary | ICD-10-CM | POA: Diagnosis present

## 2017-09-19 DIAGNOSIS — C3432 Malignant neoplasm of lower lobe, left bronchus or lung: Secondary | ICD-10-CM | POA: Diagnosis present

## 2017-09-19 DIAGNOSIS — Z8249 Family history of ischemic heart disease and other diseases of the circulatory system: Secondary | ICD-10-CM | POA: Diagnosis not present

## 2017-09-19 DIAGNOSIS — J189 Pneumonia, unspecified organism: Secondary | ICD-10-CM | POA: Diagnosis present

## 2017-09-19 DIAGNOSIS — F17210 Nicotine dependence, cigarettes, uncomplicated: Secondary | ICD-10-CM | POA: Diagnosis present

## 2017-09-19 DIAGNOSIS — J90 Pleural effusion, not elsewhere classified: Secondary | ICD-10-CM | POA: Diagnosis present

## 2017-09-19 DIAGNOSIS — Z85528 Personal history of other malignant neoplasm of kidney: Secondary | ICD-10-CM | POA: Diagnosis not present

## 2017-09-19 DIAGNOSIS — Z825 Family history of asthma and other chronic lower respiratory diseases: Secondary | ICD-10-CM | POA: Diagnosis not present

## 2017-09-19 DIAGNOSIS — M8588 Other specified disorders of bone density and structure, other site: Secondary | ICD-10-CM | POA: Diagnosis present

## 2017-09-19 DIAGNOSIS — Z681 Body mass index (BMI) 19 or less, adult: Secondary | ICD-10-CM | POA: Diagnosis not present

## 2017-09-19 DIAGNOSIS — R569 Unspecified convulsions: Secondary | ICD-10-CM | POA: Diagnosis present

## 2017-09-19 DIAGNOSIS — R0602 Shortness of breath: Secondary | ICD-10-CM | POA: Diagnosis not present

## 2017-09-19 DIAGNOSIS — R0902 Hypoxemia: Secondary | ICD-10-CM | POA: Diagnosis present

## 2017-09-19 DIAGNOSIS — I959 Hypotension, unspecified: Secondary | ICD-10-CM | POA: Diagnosis present

## 2017-09-19 DIAGNOSIS — B952 Enterococcus as the cause of diseases classified elsewhere: Secondary | ICD-10-CM | POA: Diagnosis present

## 2017-09-19 DIAGNOSIS — Z903 Acquired absence of stomach [part of]: Secondary | ICD-10-CM | POA: Diagnosis not present

## 2017-09-19 DIAGNOSIS — R64 Cachexia: Secondary | ICD-10-CM | POA: Diagnosis present

## 2017-09-19 DIAGNOSIS — Z9981 Dependence on supplemental oxygen: Secondary | ICD-10-CM | POA: Diagnosis not present

## 2017-09-19 DIAGNOSIS — F419 Anxiety disorder, unspecified: Secondary | ICD-10-CM | POA: Diagnosis not present

## 2017-09-19 LAB — CBC
HCT: 23.7 % — ABNORMAL LOW (ref 36.0–46.0)
Hemoglobin: 7.8 g/dL — ABNORMAL LOW (ref 12.0–15.0)
MCH: 28.2 pg (ref 26.0–34.0)
MCHC: 32.9 g/dL (ref 30.0–36.0)
MCV: 85.6 fL (ref 78.0–100.0)
PLATELETS: 173 10*3/uL (ref 150–400)
RBC: 2.77 MIL/uL — AB (ref 3.87–5.11)
RDW: 14.9 % (ref 11.5–15.5)
WBC: 8 10*3/uL (ref 4.0–10.5)

## 2017-09-19 LAB — BASIC METABOLIC PANEL
Anion gap: 7 (ref 5–15)
BUN: 8 mg/dL (ref 8–23)
CALCIUM: 8.2 mg/dL — AB (ref 8.9–10.3)
CO2: 18 mmol/L — ABNORMAL LOW (ref 22–32)
CREATININE: 0.63 mg/dL (ref 0.44–1.00)
Chloride: 115 mmol/L — ABNORMAL HIGH (ref 98–111)
GFR calc non Af Amer: >60 mL/min (ref >60)
GLUCOSE: 128 mg/dL — AB (ref 70–99)
Potassium: 4.7 mmol/L (ref 3.5–5.1)
Sodium: 140 mmol/L (ref 135–145)

## 2017-09-19 MED ORDER — TRAMADOL HCL 50 MG PO TABS
50.0000 mg | ORAL_TABLET | Freq: Three times a day (TID) | ORAL | Status: DC
  Administered 2017-09-19 – 2017-09-24 (×16): 50 mg via ORAL
  Filled 2017-09-19 (×16): qty 1

## 2017-09-19 MED ORDER — METHOCARBAMOL 500 MG PO TABS
500.0000 mg | ORAL_TABLET | Freq: Three times a day (TID) | ORAL | Status: DC
  Administered 2017-09-19 – 2017-09-24 (×10): 500 mg via ORAL
  Filled 2017-09-19 (×11): qty 1

## 2017-09-19 MED ORDER — ACETAMINOPHEN 500 MG PO TABS
1000.0000 mg | ORAL_TABLET | Freq: Three times a day (TID) | ORAL | Status: AC
  Administered 2017-09-19 – 2017-09-21 (×8): 1000 mg via ORAL
  Filled 2017-09-19 (×10): qty 2

## 2017-09-19 MED ORDER — SODIUM CHLORIDE 0.9 % IV SOLN
INTRAVENOUS | Status: DC
  Administered 2017-09-19 – 2017-09-20 (×2): via INTRAVENOUS

## 2017-09-19 MED ORDER — HEPARIN SODIUM (PORCINE) 5000 UNIT/ML IJ SOLN
5000.0000 [IU] | Freq: Three times a day (TID) | INTRAMUSCULAR | Status: DC
  Filled 2017-09-19: qty 1

## 2017-09-19 MED ORDER — SODIUM CHLORIDE 0.9 % IV SOLN: 1000.0000 mL | INTRAVENOUS | Status: DC

## 2017-09-19 MED ORDER — AZITHROMYCIN 250 MG PO TABS
500.0000 mg | ORAL_TABLET | Freq: Every day | ORAL | Status: DC
  Administered 2017-09-19 – 2017-09-24 (×6): 500 mg via ORAL
  Filled 2017-09-19 (×6): qty 2

## 2017-09-19 MED ORDER — BOOST / RESOURCE BREEZE PO LIQD CUSTOM
1.0000 | Freq: Two times a day (BID) | ORAL | Status: DC
  Administered 2017-09-19 – 2017-09-23 (×4): 1 via ORAL

## 2017-09-19 MED ORDER — ADULT MULTIVITAMIN LIQUID CH
15.0000 mL | Freq: Every day | ORAL | Status: DC
  Administered 2017-09-19 – 2017-09-24 (×5): 15 mL via ORAL
  Filled 2017-09-19 (×6): qty 15

## 2017-09-19 MED ORDER — LIDOCAINE 5 % EX PTCH
1.0000 | MEDICATED_PATCH | CUTANEOUS | Status: DC
  Administered 2017-09-21: 1 via TRANSDERMAL
  Filled 2017-09-19 (×6): qty 1
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
## 2017-09-19 NOTE — Progress Notes (Signed)
Initial Nutrition Assessment  DOCUMENTATION CODES:   Severe malnutrition in context of chronic illness, Underweight  INTERVENTION:   -Provide Boost Breeze po BID, each supplement provides 250 kcal and 9 grams of protein -Provide Magic cup BID with meals, each supplement provides 290 kcal and 9 grams of protein  -Provide liquid MVI  NUTRITION DIAGNOSIS:   Severe Malnutrition related to chronic illness(chronic pancreatitis, COPD, drug use and drug-seeking behaviors) as evidenced by energy intake < or equal to 75% for > or equal to 1 month, severe fat depletion, severe muscle depletion.  GOAL:   Patient will meet greater than or equal to 90% of their needs  MONITOR:   PO intake, Supplement acceptance, Labs, Weight trends, I & O's  REASON FOR ASSESSMENT:   Malnutrition Screening Tool    ASSESSMENT:   67 year old female with chronic pancreatitis, substance abuse, COPD, history of renal CA, chronic pancreatitis presented to ED with shortness of breath and inability to take oral antibiotics.   Patient currently not eating meals and reports did not eat for a few days PTA. Will order nutritional supplements. Pt awaiting MRI of thoracic spine. Per MD note, pt still exhibiting drug seeking behaviors.   Per weight records, pt's weight has been stable in the 60-70 lb range. This has been the usual body weight for the patient in the past 2 years at least.   Labs reviewed. Medications reviewed.  NUTRITION - FOCUSED PHYSICAL EXAM:    Most Recent Value  Orbital Region  Severe depletion  Upper Arm Region  Severe depletion  Thoracic and Lumbar Region  Unable to assess  Buccal Region  Severe depletion  Temple Region  Severe depletion  Clavicle Bone Region  Severe depletion  Clavicle and Acromion Bone Region  Severe depletion  Scapular Bone Region  Severe depletion  Dorsal Hand  Severe depletion  Patellar Region  Severe depletion  Anterior Thigh Region  Severe depletion  Posterior  Calf Region  Severe depletion  Edema (RD Assessment)  None       Diet Order:   Diet Order            DIET SOFT Room service appropriate? Yes; Fluid consistency: Thin  Diet effective now              EDUCATION NEEDS:   No education needs have been identified at this time  Skin:  Skin Assessment: Reviewed RN Assessment  Last BM:  9/15  Height:   Ht Readings from Last 1 Encounters:  09/17/17 5\' 4"  (1.626 m)    Weight:   Wt Readings from Last 1 Encounters:  09/17/17 33.1 kg    Ideal Body Weight:  54.5 kg  BMI:  Body mass index is 12.53 kg/m.  Estimated Nutritional Needs:   Kcal:  1400-1600  Protein:  65-75g  Fluid:  1.6L/day  Clayton Bibles, MS, RD, LDN [facility] Pager: [phone] After Hours Pager: [phone]

## 2017-09-19 NOTE — Progress Notes (Addendum)
[facility]                                                                              Patient Demographics  Lashawn Bromwell, is a 67 y.o. female, DOB - 05/06/1950, LPF:[id number]  Admit date - 09/17/2017   Admitting Physician Nita Sells, MD  Outpatient Primary MD for the patient is Azzie Glatter, FNP  Outpatient specialists:   LOS - 0  days   Medical records reviewed and are as summarized below:    Chief Complaint  Patient presents with  . Shortness of Breath       Brief summary   Patient is a 67 year old female with chronic pancreatitis, substance abuse, COPD, history of renal CA, chronic pancreatitis presented to ED with shortness of breath and inability to take oral antibiotics.  Per patient she could not eat over the last few days and her nose for bothering her, she was not able to take oral antibiotics that were prescribed.  Normally she is on 4 L of O2 at home.   Assessment & Plan    Community acquired pneumonia -Patient reports that she was not able to swallow the pills -Blood cultures from 9/13 with no growth and prior blood cultures from 8/27 with no growth -For now continue Zithromax and IV Rocephin  Urinary tract infection -Urine culture showed more than 100,000 colonies of unidentified organism, for now continue IV Rocephin -Follow sensitivities  History of left renal CA status post cryoablation and recurrence in 2012, left lower lobe pulmonary nodule -Discussed with Dr. Julien Nordmann, recommended CT chest, outpatient PET/CT has been ordered on 9/23 next week -CT chest showed left lower lobe mass 3.1 cm almost certainly neoplastic could represent primary bronchogenic carcinoma or metastatic disease, patchy consolidation of the bilateral lung bases most likely pneumonia and/or aspiration -Discussed CT results with the patient and husband at the bedside   Acute-subacute T7  fracture -New compression fracture deformity involving the  superior endplate T7 vertebral body acute to subacute, possibly pathological fracture.  Denies any recent fall. -PT OT evaluation, placed on pain control with scheduled tramadol, Tylenol, Robaxin -IR evaluation for possible kyphoplasty, discussed with the patient and husband -MRI thoracic spine ordered per IR request  Acute on chronic pain with drug-seeking behavior in the setting of substance abuse history -Per patient, she has been seen by pain management [facility], was on Suboxone and then she stopped as "it did not work".   -Patient's husband at bedside stated that he wants her to go to the rehab and she could not be accepted to any SNF  due to Suboxone.  Husband reports that he met with [facility] clinic pain specialist and was told that they will change her pain medications to oxycodone if patient gets opioids at the facility.  Patient's husband was somewhat verbally aggressive regarding the pain medications and at multiple times interrupted me and the patient.  He stated that patient had been on Dilaudid 3-4 mg 4 times a day with Ativan 1 mg 3 times a day for last 20 years -I called [facility] multiple times (atleast 10 times) today and was  not able to get hold of anyone, left voicemail to contact me.  I also called patient's PCP, Ms. Kathe Becton, FNP who reported same as above and confirmed that they did not receive any formal paperwork from [facility] clinic regarding change of her medications from Suboxone to opioids. -Patient is currently on clonidine protocol, placed on scheduled Tylenol and tramadol, Robaxin.  Place Lidoderm patch. -PT evaluation  Addendum: 1:55pm I was able to contact [facility] coordinator, Dr Angie Fava is currently with patients in clinic. I gave my contact phone # for him to call me back.   Addendum 2:35pm I was able to discuss with Dr Angie Fava, [facility] pain clinic who reported that patient has been fairly new to them, started seeing them in May 2019 and last visit  was 8/26.  She was on oral Dilaudid when patient first presented to them.  Patient was placed on Suboxone due to ongoing opioid and benzodiazepine abuse.  She has not followed up since 8/26. Dr. Angie Fava also said that he has never advocated putting her on opioids to the husband for going to the facility as patient has strong opioid abuse history. Dr Angie Fava recommended no need of putting her on Suboxone while she is inpatient when she is stating that she is not taking it at home but she has refilled the prescriptions.  He recommended discharging her on NO opioids or Suboxone and she will follow with their clinic for pain management, he will decide at that point about further mgmt.  Acute anxiety -States that she has taken Ativan 1 mg 3 times daily for last 30 years.  -Placed on low-dose Ativan 0.5 mg oral every 8 hours as needed, relayed to the patient that I will not be discharging her on benzodiazepines.  She will need to follow-up with her psychiatrist and pain management clinic or her PCP.  Will not escalate any further.  Severe protein calorie malnutrition -Nutrition consult, placed on supplement   Code Status: Full CODE STATUS DVT Prophylaxis:  Lovenox  Family Communication: Discussed in detail with the patient, all imaging results, lab results explained to the patient and husband at the bedside.  Addendum: 1:10pm   I was called by RN that patient's husband was again at the bedside and demanding patient to be started on narcotics.  Met with RN, director and [facility] and made them aware of the situation.    Disposition Plan:   Time Spent in minutes: 44mns discussing with the patient and her husband at the bedside  Procedures:  None  Consultants:   Discussed with Dr. MJulien Nordmannon phone Interventional radiology  Antimicrobials:      Medications  Scheduled Meds: . acetaminophen  1,000 mg Oral Q8H  . azithromycin  500 mg Oral Daily  . cloNIDine  0.1 mg Oral QID   Followed by  .  cloNIDine  0.1 mg Oral BH-qamhs   Followed by  . [START ON 09/22/2017] cloNIDine  0.1 mg Oral QAC breakfast  . heparin injection (subcutaneous)  5,000 Units Subcutaneous Q8H  . Influenza vac split quadrivalent PF  0.5 mL Intramuscular Tomorrow-1000  . magnesium citrate  1 Bottle Oral Once  . mouth rinse  15 mL Mouth Rinse BID  . methocarbamol  500 mg Oral TID  . mirabegron ER  25 mg Oral Daily  . mometasone-formoterol  2 puff Inhalation BID  . tamsulosin  0.4 mg Oral Daily  . traMADol  50 mg Oral Q8H   Continuous Infusions: . sodium chloride    .  cefTRIAXone (ROCEPHIN)  IV Stopped (09/18/17 1325)   PRN Meds:.loperamide, LORazepam, ondansetron, tiotropium   Antibiotics   Anti-infectives (From admission, onward)   Start     Dose/Rate Route Frequency Ordered Stop   09/19/17 1000  azithromycin (ZITHROMAX) tablet 500 mg     500 mg Oral Daily 09/19/17 0816     09/18/17 1400  azithromycin (ZITHROMAX) 500 mg in sodium chloride 0.9 % 250 mL IVPB  Status:  Discontinued     500 mg 250 mL/hr over 60 Minutes Intravenous Every 24 hours 09/17/17 1547 09/19/17 0816   09/17/17 1130  cefTRIAXone (ROCEPHIN) 1 g in sodium chloride 0.9 % 100 mL IVPB     1 g 200 mL/hr over 30 Minutes Intravenous Every 24 hours 09/17/17 1115     09/17/17 1100  ceFEPIme (MAXIPIME) 2 g in sodium chloride 0.9 % 100 mL IVPB  Status:  Discontinued     2 g 200 mL/hr over 30 Minutes Intravenous  Once 09/17/17 1054 09/17/17 1115   09/17/17 1100  azithromycin (ZITHROMAX) 500 mg in sodium chloride 0.9 % 250 mL IVPB  Status:  Discontinued     500 mg 250 mL/hr over 60 Minutes Intravenous Every 24 hours 09/17/17 1054 09/17/17 1559        Subjective:   Kristien Salatino was seen and examined today.  Husband at the bedside, patient in between asking for pain medication.  No nausea or vomiting, abdominal pain.  Objective:   Vitals:   09/18/17 2031 09/18/17 2146 09/19/17 0502 09/19/17 0911  BP:  100/65 112/66   Pulse:  77 79    Resp:  18 16   Temp:  97.7 F (36.5 C) 98.7 F (37.1 C)   TempSrc:  Oral Oral   SpO2: 100% 100% 100% 96%  Weight:      Height:        Intake/Output Summary (Last 24 hours) at 09/19/2017 1132 Last data filed at 09/19/2017 0525 Gross per 24 hour  Intake 2635.04 ml  Output -  Net 2635.04 ml     Wt Readings from Last 3 Encounters:  09/17/17 33.1 kg  09/15/17 30.4 kg  09/13/17 30.8 kg     Exam   General: Alert and oriented x 3 cachectic   Eyes:   HEENT:    Cardiovascular: S1 S2 auscultated,Regular rate and rhythm. No pedal edema b/l  Respiratory: Clear to auscultation bilaterally, no wheezing, rales or rhonchi  Gastrointestinal: Soft, nontender, nondistended, + bowel sounds  Ext: no pedal edema bilaterally  Neuro: no new deficits, no significant spinal tenderness on exam  Musculoskeletal: No digital cyanosis, clubbing  Skin: No rashes  Psych: anxious   Data Reviewed:  I have personally reviewed following labs and imaging studies  Micro Results Recent Results (from the past 240 hour(s))  Blood culture (routine x 2)     Status: None (Preliminary result)   Collection Time: 09/16/17  5:20 PM  Result Value Ref Range Status   Specimen Description   Final    BLOOD RIGHT ANTECUBITAL Performed at [facility] [address], [place], [place] [address]    Special Requests   Final    BOTTLES DRAWN AEROBIC AND ANAEROBIC Blood Culture results may not be optimal due to an inadequate volume of blood received in culture bottles Performed at [facility] [address] [place], [place] [address]    Culture   Final    NO GROWTH 3 DAYS Performed at [facility],  [address], [place], [place] [address]    Report Status PENDING  Incomplete  Blood culture (routine x 2)     Status: None (Preliminary result)   Collection Time: 09/16/17  6:15 PM  Result Value Ref Range Status   Specimen Description   Final    BLOOD RIGHT  ARM Performed at [facility] [address] [place], [place] [address]    Special Requests   Final    BOTTLES DRAWN AEROBIC AND ANAEROBIC Blood Culture adequate volume Performed at [place] [facility] [address] [place], [place] [address]    Culture   Final    NO GROWTH 3 DAYS Performed at [facility], [place] [address] [place], [place] [address]    Report Status PENDING  Incomplete  Urine Culture     Status: Abnormal (Preliminary result)   Collection Time: 09/17/17  7:12 PM  Result Value Ref Range Status   Specimen Description   Final    URINE, CLEAN CATCH Performed at [facility], [place] [address], [place], [place] [address]    Special Requests   Final    NONE Performed at [facility], [place] [address], [place], [place] [address]    Culture (A)  Final    >=100,000 COLONIES/mL UNIDENTIFIED ORGANISM Performed at [facility], [place] [address], [place], [place] [address]    Report Status PENDING  Incomplete    Radiology Reports Dg Chest 2 View  Result Date: 09/17/2017 CLINICAL DATA:  Shortness of breath EXAM: CHEST - 2 VIEW COMPARISON:  09/15/2017 FINDINGS: Cardiac shadow is stable. A retrocardiac mass lesion is again identified similar to that seen on the prior exam on the left. Patchy infiltrate is again noted in the right lung base stable from the previous exam. No new focal abnormality is noted. No bony abnormality is seen. IMPRESSION: Stable right lower lobe infiltrate. Stable left lower lobe mass. Electronically Signed   By: Inez Catalina M.D.   On: 09/17/2017 14:22   Dg Chest 2 View  Result Date: 09/15/2017 CLINICAL DATA:  Pt reports being unable to urinate since last night. Pt hx of COPD and normally on 4L. Pt is SOB and O2 sat is 86%. Smoker. EXAM: CHEST - 2 VIEW COMPARISON:  09/04/2017 FINDINGS: Lungs are markedly hyperinflated. The heart size is normal. In the LEFT LOWER lobe, oval mass  is estimated to measure 4.1 x 3.0 centimeters, larger compared to prior studies. There is patchy opacity in the RIGHT lung base, consistent with infectious abnormality. No pulmonary edema. IMPRESSION: Increasing size of LEFT LOWER lobe mass. RIGHT LOWER lobe infiltrate. Electronically Signed   By: Nolon Nations M.D.   On: 09/15/2017 16:30   Dg Chest 2 View  Result Date: 09/04/2017 CLINICAL DATA:  Patient with productive cough EXAM: CHEST - 2 VIEW COMPARISON:  Chest radiograph 09/03/2017 FINDINGS: Stable cardiac and mediastinal contours. Re-demonstrated 3.4 cm mass projecting over the posterior left lower lung. Pulmonary hyperinflation. Emphysematous change. No large area pulmonary consolidation. No pleural effusion or pneumothorax. Thoracic spine degenerative changes. IMPRESSION: There is a 3.4 cm mass projecting over the medial left lower lung. This needs dedicated evaluation with contrast-enhanced chest CT. Considerations include primary pulmonary malignancy or metastatic disease. Electronically Signed   By: Lovey Newcomer M.D.   On: 09/04/2017 18:23   Dg Chest 2 View  Result Date: 09/03/2017 CLINICAL DATA:  Diffuse pain. EXAM: CHEST - 2 VIEW COMPARISON:  PA and  lateral chest 08/30/2017. CT chest, abdomen and pelvis 01/15/2017. FINDINGS: The lungs are markedly emphysematous but clear. Heart size is normal. No pneumothorax or pleural effusion. No acute or focal bony abnormality. IMPRESSION: Severe emphysema without acute disease. Electronically Signed   By: Inge Rise M.D.   On: 09/03/2017 12:58   Dg Chest 2 View  Result Date: 08/30/2017 CLINICAL DATA:  Cough and COPD EXAM: CHEST - 2 VIEW COMPARISON:  08/28/2017 FINDINGS: The lungs are hyperinflated with diffuse interstitial prominence. No focal airspace consolidation or pulmonary edema. No pleural effusion or pneumothorax. Normal cardiomediastinal contours. Unchanged appearance of left paraspinous mass. IMPRESSION: 1. COPD without focal airspace  disease. 2. Unchanged appearance of left paramedian mass. Electronically Signed   By: Ulyses Jarred M.D.   On: 08/30/2017 18:53   Ct Chest W Contrast  Result Date: 09/18/2017 CLINICAL DATA:  LEFT lower lobe pulmonary mass. Rib pain. History of LEFT renal clear cell carcinoma in 2009, status post cryoablation of cancer recurrence in 2012. EXAM: CT CHEST WITH CONTRAST TECHNIQUE: Multidetector CT imaging of the chest was performed during intravenous contrast administration. CONTRAST:  39m OMNIPAQUE IOHEXOL 300 MG/ML  SOLN COMPARISON:  CT chest dated 01/15/2017. Chest x-ray dated 09/17/2017. FINDINGS: Cardiovascular: No thoracic aortic aneurysm or evidence of aortic dissection. Aortic atherosclerosis. Heart size is normal. Diffuse coronary artery calcifications. Mediastinum/Nodes: Scattered small lymph nodes within the mediastinum and perihilar regions. No mass or enlarged lymph nodes seen. Esophagus is unremarkable. Small amount of debris within the trachea and central bronchi. Lungs/Pleura: Mass within the medial aspects of the LEFT lower lobe measures 3.1 x 2.8 cm, corresponding to findings on recent chest x-rays, new compared to the earlier chest CT of 01/15/2017, highly suggestive of neoplastic mass. Patchy consolidations within the lower lobes bilaterally, dependent aspects, new compared to the earlier chest CT, stable or increasing compared to recent chest x-rays. Small bilateral pleural effusions. Mild emphysematous changes at the lung apices. Upper Abdomen: No acute findings on limited images of the upper abdomen. Musculoskeletal: New compression fracture deformity involving the superior endplate of the T7 vertebral body, likely acute to subacute, suspected pathologic fracture. No other acute or suspicious osseous finding. IMPRESSION: 1. LEFT lower lobe mass measures 3.1 cm, almost certainly neoplastic. This could represent primary bronchogenic carcinoma or metastatic disease. Recommend tissue sampling. 2.  Patchy consolidations at the bilateral lung bases, most likely pneumonia and/or aspiration. 3. Small bilateral pleural effusions. 4. New compression fracture deformity involving the superior endplate of the T7 vertebral body, approximately 30% compressed anteriorly and centrally, acute to subacute in age, suspected pathologic fracture. No associated vertebral body retropulsion or displacement. 5. Diffuse coronary artery calcifications. Aortic Atherosclerosis (ICD10-I70.0) and Emphysema (ICD10-J43.9). Electronically Signed   By: SFranki CabotM.D.   On: 09/18/2017 12:34   Ct Abdomen Pelvis W Contrast  Result Date: 08/22/2017 CLINICAL DATA:  Acute abdominal pain. Patient reports back and leg pain. Decreased appetite. EXAM: CT ABDOMEN AND PELVIS WITH CONTRAST TECHNIQUE: Multidetector CT imaging of the abdomen and pelvis was performed using the standard protocol following bolus administration of intravenous contrast. CONTRAST:  761mOMNIPAQUE IOHEXOL 300 MG/ML  SOLN COMPARISON:  Chest, abdomen, and pelvis CT 01/15/2017, 02/05/2017, prior chest radiographs FINDINGS: Lower chest: Pulmonary nodule with central low density in the left lower lobe measures 2.5 cm, only partially included in the field of view. This abuts the pleura posteriorly. This likely represents increase size of a 3 mm nodule on prior exam. Hepatobiliary: Unchanged intra and extrahepatic biliary ductal  dilatation, common bile duct measuring approximately 15 mm. A calcified gallstone or visualized choledocholithiasis. Gallbladder is partially distended. No focal hepatic lesion. Pancreas: Atrophic with parenchyma not well visualized. No peripancreatic inflammation. Spleen: Normal in size without focal abnormality. Adrenals/Urinary Tract: No adrenal nodule. Stable appearance of post ablation defect in the lower left kidney with dystrophic calcification. No hydronephrosis. No perinephric edema. No new renal lesion allowing for mild motion artifact.  Urinary bladder is physiologically distended, no bladder wall thickening. Stomach/Bowel: Paucity of intra-abdominal fat limits detailed bowel evaluation. Small hiatal hernia. Fluid in the stomach with equivocal gastric wall thickening. Surgical clips along the lesser curvature of stomach. No bowel obstruction, evidence of wall thickening or inflammatory change. Appendix not visualized. Vascular/Lymphatic: Advanced aortic and branch atherosclerosis. Atherosclerotic calcification of the origin of both renal arteries. No acute vascular findings. No bulky adenopathy, evaluation for adenopathy is limited due to paucity of intra-abdominal fat. Reproductive: Uterus not definitively visualized.  No adnexal mass. Other: No free air or free fluid. Subcutaneous calcifications in the left greater than right flank, unchanged and incidental. Musculoskeletal: Bones are under mineralized. Degenerative disc disease throughout the lumbar spine. No blastic osseous lesion. Evaluation for lytic lesions limited given underlying bony under mineralization. Bilateral hip pinning. IMPRESSION: 1. Fluid in the stomach with possible gastric enhancement, may represent gastritis. 2. Partially included 2.5 cm left lower lobe pulmonary nodule, retrospectively increasing since CT 6 months prior were is faintly visualized. This may represent metastasis given history of renal cancer versus primary bronchogenic malignancy. Recommend complete chest evaluation with dedicated chest CT, this could be performed on a nonemergent basis based on patient's symptoms. 3. Chronic biliary dilatation, unchanged from prior exams. 4.  Aortic Atherosclerosis (ICD10-I70.0). Electronically Signed   By: Jeb Levering M.D.   On: 08/22/2017 22:26   Dg Abdomen Acute W/chest  Result Date: 08/28/2017 CLINICAL DATA:  Epigastric pain. EXAM: DG ABDOMEN ACUTE W/ 1V CHEST COMPARISON:  CT of the abdomen and pelvis 08/22/2017 FINDINGS: The heart size is normal. Atherosclerotic  calcifications are present at the aortic arch. Emphysematous changes are noted. The left paraspinous 2.5 cm mass is again noted. Bowel gas pattern is normal. There is no free air. No obstruction is present. Postsurgical changes are present at both hips. IMPRESSION: 1. No acute cardiopulmonary disease. 2. Left paraspinous mass. 3. Normal bowel gas pattern. No acute or focal lesion to explain epigastric pain. Electronically Signed   By: San Morelle M.D.   On: 08/28/2017 15:37   Ct Renal Stone Study  Result Date: 08/30/2017 CLINICAL DATA:  Recurring urinary tract infection. History of substance abuse, pancreatitis, renal cell carcinoma. EXAM: CT ABDOMEN AND PELVIS WITHOUT CONTRAST TECHNIQUE: Multidetector CT imaging of the abdomen and pelvis was performed following the standard protocol without IV contrast. COMPARISON:  CT abdomen and pelvis August 22, 2017 FINDINGS: Moderate respiratory motion degraded examination. LOWER CHEST: Lung bases are clear. The visualized heart size is normal. Small pericardial effusion. HEPATOBILIARY: Negative liver. Similar mild biliary dilatation better demonstrated on recent contrast-enhanced CT. No gallbladder distension or definite cholelithiasis though limited by motion. PANCREAS: Normal. SPLEEN: Punctate splenic granuloma. ADRENALS/URINARY TRACT: Kidneys are orthotopic, demonstrating normal size and morphology. Partially calcified 13 mm LEFT lower pole mass at site of prior ablation. No nephrolithiasis, hydronephrosis; limited assessment for renal masses by nonenhanced CT. The unopacified ureters are normal in course and caliber. Urinary bladder is partially distended and unremarkable. Normal adrenal glands. STOMACH/BOWEL: The stomach, small and large bowel are normal in course and  caliber without inflammatory changes, sensitivity decreased by lack of enteric contrast. Surgical suture material at gastric antrum. Mild stool distended rectum with moderate amount of retained  large bowel stool. Normal appendix. VASCULAR/LYMPHATIC: Aortoiliac vessels are normal in course and caliber. Severe calcific atherosclerosis. No lymphadenopathy by CT size criteria. REPRODUCTIVE: Status post hysterectomy. OTHER: No intraperitoneal free fluid or free air. MUSCULOSKELETAL: Non-acute. Streak artifact from bilateral femoral neck pinning. Cachexia. Osteopenia. Grade 1 L2-3 retrolisthesis. IMPRESSION: 1. Moderately motion degraded examination. 2. No urolithiasis or obstructive uropathy. Post treatment changes LEFT kidney. 3. Moderate amount of retained large bowel stool without bowel obstruction. Aortic Atherosclerosis (ICD10-I70.0). Electronically Signed   By: Elon Alas M.D.   On: 08/30/2017 19:08    Lab Data:  CBC: Recent Labs  Lab 09/13/17 1333  09/15/17 1509 09/16/17 1725 09/16/17 1750 09/17/17 1313 09/17/17 1614 09/18/17 0802  WBC 15.0*  --  12.0*  --  10.8* 8.8 7.5 5.1  NEUTROABS 11.6*  --  9.6*  --  8.4* 6.6  --   --   HGB 12.2   < > 12.7 12.2 12.0 11.4* 8.6* 8.5*  HCT 37.3  --  38.4 36.0 36.3 35.3* 26.9* 26.3*  MCV 88.0  --  87.5  --  87.9 87.8 87.9 87.4  PLT 393  --  499*  --  423* 426* 325 275   < > = values in this interval not displayed.   Basic Metabolic Panel: Recent Labs  Lab 09/13/17 1333  09/15/17 1509 09/16/17 1725 09/16/17 1750 09/17/17 1313 09/17/17 1614 09/18/17 0802  NA 138  --  141 136 142 142  --  143  K 4.5  --  4.3 5.3* 4.5 3.8  --  4.0  CL 100  --  102 103 103 105  --  112*  CO2 25  --  28  --  27 27  --  24  GLUCOSE 150*  --  102* 69* 77 90  --  86  BUN 12  --  19 26* 21 18  --  11  CREATININE 0.89   < > 0.90 1.00 0.91 0.78 0.69 0.70  CALCIUM 10.4*  --  10.0  --  9.8 9.5  --  8.3*   < > = values in this interval not displayed.   GFR: Estimated Creatinine Clearance: 37.1 mL/min (by C-G formula based on SCr of 0.7 mg/dL). Liver Function Tests: Recent Labs  Lab 09/13/17 1333 09/17/17 1313  AST 10* 16  ALT 6 11  ALKPHOS  123 89  BILITOT 0.5 0.4  PROT 7.9 7.2  ALBUMIN 3.2* 3.5   No results for input(s): LIPASE, AMYLASE in the last 168 hours. No results for input(s): AMMONIA in the last 168 hours. Coagulation Profile: No results for input(s): INR, PROTIME in the last 168 hours. Cardiac Enzymes: No results for input(s): CKTOTAL, CKMB, CKMBINDEX, TROPONINI in the last 168 hours. BNP (last 3 results) No results for input(s): PROBNP in the last 8760 hours. HbA1C: No results for input(s): HGBA1C in the last 72 hours. CBG: No results for input(s): GLUCAP in the last 168 hours. Lipid Profile: No results for input(s): CHOL, HDL, LDLCALC, TRIG, CHOLHDL, LDLDIRECT in the last 72 hours. Thyroid Function Tests: No results for input(s): TSH, T4TOTAL, FREET4, T3FREE, THYROIDAB in the last 72 hours. Anemia Panel: No results for input(s): VITAMINB12, FOLATE, FERRITIN, TIBC, IRON, RETICCTPCT in the last 72 hours. Urine analysis:    Component Value Date/Time   COLORURINE AMBER (A) 09/17/2017 1912  APPEARANCEUR CLOUDY (A) 09/17/2017 1912   LABSPEC 1.019 09/17/2017 1912   PHURINE 5.0 09/17/2017 1912   GLUCOSEU NEGATIVE 09/17/2017 1912   HGBUR MODERATE (A) 09/17/2017 1912   BILIRUBINUR NEGATIVE 09/17/2017 1912   BILIRUBINUR negative 06/03/2017 1120   KETONESUR NEGATIVE 09/17/2017 1912   PROTEINUR NEGATIVE 09/17/2017 1912   UROBILINOGEN 0.2 06/03/2017 1120   UROBILINOGEN 0.2 03/15/2017 1004   NITRITE NEGATIVE 09/17/2017 1912   LEUKOCYTESUR SMALL (A) 09/17/2017 1912     Govani Radloff M.D. [facility] 09/19/2017, 11:32 AM  Pager: [phone] Between 7am to 7pm - call Pager - [phone]  After 7pm go to [email or web address] - password [email or web address]  Call night coverage person covering after 7pm

## 2017-09-19 NOTE — Progress Notes (Signed)
PHARMACIST - PHYSICIAN COMMUNICATION DR:   [place] CONCERNING: Antibiotic IV to Oral Route Change Policy  RECOMMENDATION: This patient is receiving Zithromax by the intravenous route.  Based on criteria approved by the Pharmacy and Therapeutics Committee, the antibiotic(s) is/are being converted to the equivalent oral dose form(s).   DESCRIPTION: These criteria include:  Patient being treated for a respiratory tract infection, urinary tract infection, cellulitis or clostridium difficile associated diarrhea if on metronidazole  The patient is not neutropenic and does not exhibit a GI malabsorption state  The patient is eating (either orally or via tube) and/or has been taking other orally administered medications for a least 24 hours  The patient is improving clinically and has a Tmax < 100.5  If you have questions about this conversion, please contact the Pharmacy Department  []   [phone] )  [facility] []   [phone] )  [facility] []   [phone] )  [facility] []   [phone] )  [facility] [x]   [phone] )  [place], PharmD, BCPS 09/19/2017 8:16 AM

## 2017-09-19 NOTE — Progress Notes (Signed)
Patient ID: [name], female   DOB: November 30, 1950, 67 y.o.   MRN: [id number] Request received for consideration of T7 vertebral body augmentation on patient.  Imaging studies were reviewed by Dr. Earleen Newport.  He recommends MRI thoracic spine prior to considering procedure on patient.  She would also need to be blood/urine culture negative and off antibiotic therapy prior to procedure if approved.

## 2017-09-20 ENCOUNTER — Telehealth: Payer: Self-pay

## 2017-09-20 LAB — BASIC METABOLIC PANEL
ANION GAP: 5 (ref 5–15)
BUN: 9 mg/dL (ref 8–23)
CALCIUM: 8.2 mg/dL — AB (ref 8.9–10.3)
CHLORIDE: 114 mmol/L — AB (ref 98–111)
CO2: 20 mmol/L — AB (ref 22–32)
Creatinine, Ser: 0.58 mg/dL (ref 0.44–1.00)
GFR calc non Af Amer: >60 mL/min (ref >60)
Glucose, Bld: 94 mg/dL (ref 70–99)
Potassium: 4.6 mmol/L (ref 3.5–5.1)
SODIUM: 139 mmol/L (ref 135–145)

## 2017-09-20 LAB — CBC
HEMATOCRIT: 28.5 % — AB (ref 36.0–46.0)
HEMOGLOBIN: 9.1 g/dL — AB (ref 12.0–15.0)
MCH: 27.7 pg (ref 26.0–34.0)
MCHC: 31.9 g/dL (ref 30.0–36.0)
MCV: 86.9 fL (ref 78.0–100.0)
Platelets: 242 10*3/uL (ref 150–400)
RBC: 3.28 MIL/uL — AB (ref 3.87–5.11)
RDW: 15.3 % (ref 11.5–15.5)
WBC: 8 10*3/uL (ref 4.0–10.5)

## 2017-09-20 LAB — URINE CULTURE

## 2017-09-20 MED ORDER — AMOXICILLIN-POT CLAVULANATE 875-125 MG PO TABS
1.0000 | ORAL_TABLET | Freq: Two times a day (BID) | ORAL | Status: DC
  Administered 2017-09-20 – 2017-09-24 (×9): 1 via ORAL
  Filled 2017-09-20 (×8): qty 1

## 2017-09-20 MED ORDER — FUROSEMIDE 10 MG/ML IJ SOLN
20.0000 mg | Freq: Once | INTRAMUSCULAR | Status: AC
  Administered 2017-09-20: 20 mg via INTRAVENOUS
  Filled 2017-09-20: qty 2

## 2017-09-20 NOTE — Progress Notes (Signed)
[facility]                                                                              Patient Demographics  [name], is a 67 y.o. female, DOB - 1950/06/14, SFK:[id number]  Admit date - 09/17/2017   Admitting Physician Nita Sells, MD  Outpatient Primary MD for the patient is Azzie Glatter, FNP  Outpatient specialists:   LOS - 1  days   Medical records reviewed and are as summarized below:    Chief Complaint  Patient presents with  . Shortness of Breath       Brief summary   Patient is a 67 year old female with chronic pancreatitis, substance abuse, COPD, history of renal CA, chronic pancreatitis presented to ED with shortness of breath and inability to take oral antibiotics.  Per patient she could not eat over the last few days and her nose for bothering her, she was not able to take oral antibiotics that were prescribed.  Normally she is on 4 L of O2 at home.   Assessment & Plan    Community acquired pneumonia -Patient reports that she was not able to swallow the pills -Blood cultures from 9/13 with no growth and prior blood cultures from 8/27 with no growth -DC IV Zithromax, Rocephin, transition to oral Augmentin -MRI thoracic spine incidentally showed worsening bilateral pleural effusions and bibasilar consolidation/edema, gave Lasix 20 mg IV x1.  Patient refused BNP.  Enterococcus urinary tract infection -Urine culture showed more than 100,000 colonies of Enterococcus faecalis pansensitive -DC IV Rocephin, transition to oral Augmentin for lung coverage/PNA  History of left renal CA status post cryoablation and recurrence in 2012, left lower lobe pulmonary nodule -Discussed with Dr. Julien Nordmann, recommended CT chest, outpatient PET/CT has been ordered on 9/23 next week -CT chest showed left lower lobe mass 3.1 cm almost certainly neoplastic could represent primary bronchogenic carcinoma or metastatic disease, patchy consolidation of  the bilateral lung bases most likely pneumonia and/or aspiration -CT results have been discussed with patient and her husband.   Acute-subacute T7  fracture -New compression fracture deformity involving the superior endplate T7 vertebral body acute to subacute, possibly pathological fracture.  Denies any recent fall. -PT OT evaluation, placed on pain control with scheduled tramadol, Tylenol, Robaxin, Lidoderm patch -MRI thoracic spine showed mild superior endplate deformity of T7, possibly chronic compression fracture likely osteopenic.  Per IR, no indication of kyphoplasty at this time  Acute on chronic pain with drug-seeking behavior in the setting of substance abuse history -Please refer to my note from 9/16 -I have discussed with [facility], Dr. Angie Fava and patient's PCP, patient has a long-standing history of opioid abuse. -Continues to demand for narcotics. -As there is no acute compression fracture requiring intervention,  will continue conservative management, PT. -Patient refuses Suboxone, will not start while inpatient   Acute anxiety -States that she has taken Ativan 1 mg 3 times daily for last 30 years.  -Placed on low-dose Ativan 0.5 mg oral every 8 hours as needed, relayed to the patient that I will not be discharging her on benzodiazepines.  She will need to follow-up  with her psychiatrist and pain management clinic or her PCP.  Will not escalate any further.  Severe protein calorie malnutrition -Nutrition consult, placed on supplements   Code Status: Full CODE STATUS DVT Prophylaxis:  Lovenox  Family Communication: Discussed in detail with the patient, all imaging results, lab results explained to the patient and husband at the bedside on 9/16.   Disposition Plan: Skilled nursing facility  Time Spent in minutes: 35   procedures:  MRI T-spine  Consultants:   Discussed with Dr. Julien Nordmann on phone Interventional radiology  Antimicrobials:       Medications  Scheduled Meds: . acetaminophen  1,000 mg Oral Q8H  . amoxicillin-clavulanate  1 tablet Oral BID WC  . azithromycin  500 mg Oral Daily  . cloNIDine  0.1 mg Oral BH-qamhs   Followed by  . [START ON 09/22/2017] cloNIDine  0.1 mg Oral QAC breakfast  . feeding supplement  1 Container Oral BID BM  . lidocaine  1 patch Transdermal Q24H  . magnesium citrate  1 Bottle Oral Once  . mouth rinse  15 mL Mouth Rinse BID  . methocarbamol  500 mg Oral TID  . mirabegron ER  25 mg Oral Daily  . mometasone-formoterol  2 puff Inhalation BID  . multivitamin  15 mL Oral Daily  . tamsulosin  0.4 mg Oral Daily  . traMADol  50 mg Oral Q8H   Continuous Infusions:  PRN Meds:.loperamide, LORazepam, ondansetron, tiotropium   Antibiotics   Anti-infectives (From admission, onward)   Start     Dose/Rate Route Frequency Ordered Stop   09/20/17 1200  amoxicillin-clavulanate (AUGMENTIN) 875-125 MG per tablet 1 tablet     1 tablet Oral 2 times daily with meals 09/20/17 1111     09/19/17 1000  azithromycin (ZITHROMAX) tablet 500 mg     500 mg Oral Daily 09/19/17 0816     09/18/17 1400  azithromycin (ZITHROMAX) 500 mg in sodium chloride 0.9 % 250 mL IVPB  Status:  Discontinued     500 mg 250 mL/hr over 60 Minutes Intravenous Every 24 hours 09/17/17 1547 09/19/17 0816   09/17/17 1130  cefTRIAXone (ROCEPHIN) 1 g in sodium chloride 0.9 % 100 mL IVPB  Status:  Discontinued     1 g 200 mL/hr over 30 Minutes Intravenous Every 24 hours 09/17/17 1115 09/20/17 1111   09/17/17 1100  ceFEPIme (MAXIPIME) 2 g in sodium chloride 0.9 % 100 mL IVPB  Status:  Discontinued     2 g 200 mL/hr over 30 Minutes Intravenous  Once 09/17/17 1054 09/17/17 1115   09/17/17 1100  azithromycin (ZITHROMAX) 500 mg in sodium chloride 0.9 % 250 mL IVPB  Status:  Discontinued     500 mg 250 mL/hr over 60 Minutes Intravenous Every 24 hours 09/17/17 1054 09/17/17 1559        Subjective:   [name] was seen and  examined today.  No acute issues overnight, complaining of pain all over, asking for pain medications.  States she will not do kyphoplasty if she is not given pain medications.  No nausea, vomiting, abdominal pain, diarrhea.  No chest pain or shortness of breath   Objective:   Vitals:   09/19/17 2122 09/19/17 2126 09/19/17 2224 09/20/17 0539  BP: 107/79 117/79  116/72  Pulse:  74  67  Resp:  20  20  Temp:  98.3 F (36.8 C)  98.9 F (37.2 C)  TempSrc:  Oral  Oral  SpO2:  100% 100% 100%  Weight:      Height:        Intake/Output Summary (Last 24 hours) at 09/20/2017 1409 Last data filed at 09/20/2017 0600 Gross per 24 hour  Intake 1134.92 ml  Output 850 ml  Net 284.92 ml     Wt Readings from Last 3 Encounters:  09/17/17 33.1 kg  09/15/17 30.4 kg  09/13/17 30.8 kg     Exam    General: Alert and oriented x 3, cachectic, asking for pain meds during counter  Eyes:   HEENT:  Atraumatic, normocephalic  Cardiovascular: S1 S2 auscultated, Regular rate and rhythm. No pedal edema b/l  Respiratory: CTA B  Gastrointestinal: Soft, nontender, nondistended, + bowel sounds  Ext: no pedal edema bilaterally  Neuro:   Musculoskeletal: No digital cyanosis, clubbing  Skin: No rashes  Psych: alert and oriented x3     Data Reviewed:  I have personally reviewed following labs and imaging studies  Micro Results Recent Results (from the past 240 hour(s))  Blood culture (routine x 2)     Status: None (Preliminary result)   Collection Time: 09/16/17  5:20 PM  Result Value Ref Range Status   Specimen Description   Final    BLOOD RIGHT ANTECUBITAL Performed at [facility] [address], [place], [place] [address]    Special Requests   Final    BOTTLES DRAWN AEROBIC AND ANAEROBIC Blood Culture results may not be optimal due to an inadequate volume of blood received in culture bottles Performed at [facility] [address],  [place], [place] [address]    Culture   Final    NO GROWTH 4 DAYS Performed at [facility], [place] [address], [place], [place] [address]    Report Status PENDING  Incomplete  Blood culture (routine x 2)     Status: None (Preliminary result)   Collection Time: 09/16/17  6:15 PM  Result Value Ref Range Status   Specimen Description   Final    BLOOD RIGHT ARM Performed at [facility] [address] [place], [place] [address]    Special Requests   Final    BOTTLES DRAWN AEROBIC AND ANAEROBIC Blood Culture adequate volume Performed at [facility] [address], [place], [place] [address]    Culture   Final    NO GROWTH 4 DAYS Performed at [facility], [place] [address], [place], [place] [address]    Report Status PENDING  Incomplete  Urine Culture     Status: Abnormal   Collection Time: 09/17/17  7:12 PM  Result Value Ref Range Status   Specimen Description   Final    URINE, CLEAN CATCH Performed at [facility], [place] [address], [place], [place] [address]    Special Requests   Final    NONE Performed at [facility], [place] [address] [place], [place] [address]    Culture >=100,000 COLONIES/mL ENTEROCOCCUS FAECALIS (A)  Final   Report Status 09/20/2017 FINAL  Final   Organism ID, Bacteria ENTEROCOCCUS FAECALIS (A)  Final      Susceptibility   Enterococcus faecalis - MIC*    AMPICILLIN <=2 SENSITIVE Sensitive     LEVOFLOXACIN 1 SENSITIVE Sensitive     NITROFURANTOIN <=16 SENSITIVE Sensitive     VANCOMYCIN 1 SENSITIVE Sensitive     * >=100,000 COLONIES/mL ENTEROCOCCUS FAECALIS    Radiology Reports Dg Chest 2 View  Result Date: 09/17/2017 CLINICAL DATA:  Shortness of breath EXAM: CHEST - 2 VIEW COMPARISON:  09/15/2017 FINDINGS: Cardiac shadow is stable. A retrocardiac mass lesion is again identified similar to that seen on the prior exam on the left. Patchy infiltrate is again noted in the  right lung base stable from the previous exam. No new focal abnormality is noted. No bony abnormality is seen. IMPRESSION: Stable right lower lobe infiltrate. Stable left lower lobe mass. Electronically Signed   By: Inez Catalina M.D.   On: 09/17/2017 14:22   Dg Chest 2 View  Result Date: 09/15/2017 CLINICAL DATA:  Pt reports being unable to urinate since last night. Pt hx of COPD and normally on 4L. Pt is SOB and O2 sat is 86%. Smoker. EXAM: CHEST - 2 VIEW COMPARISON:  09/04/2017 FINDINGS: Lungs are markedly hyperinflated. The heart size is normal. In the LEFT LOWER lobe, oval mass is estimated to measure 4.1 x 3.0 centimeters, larger compared to prior studies. There is patchy opacity in the RIGHT lung base, consistent with infectious abnormality. No pulmonary edema. IMPRESSION: Increasing size of LEFT LOWER lobe mass. RIGHT LOWER lobe infiltrate. Electronically Signed   By: Nolon Nations M.D.   On: 09/15/2017 16:30   Dg Chest 2 View  Result Date: 09/04/2017 CLINICAL DATA:  Patient with productive cough EXAM: CHEST - 2 VIEW COMPARISON:  Chest radiograph 09/03/2017 FINDINGS: Stable cardiac and mediastinal contours. Re-demonstrated 3.4 cm mass projecting over the posterior left lower lung. Pulmonary hyperinflation. Emphysematous change. No large area pulmonary consolidation. No pleural effusion or pneumothorax. Thoracic spine degenerative changes. IMPRESSION: There is a 3.4 cm mass projecting over the medial left lower lung. This needs dedicated evaluation with contrast-enhanced chest CT. Considerations include primary pulmonary malignancy or metastatic disease. Electronically Signed   By: Lovey Newcomer M.D.   On: 09/04/2017 18:23   Dg Chest 2 View  Result Date: 09/03/2017 CLINICAL DATA:  Diffuse pain. EXAM: CHEST - 2 VIEW COMPARISON:  PA and lateral chest 08/30/2017. CT chest, abdomen and pelvis 01/15/2017. FINDINGS: The lungs are markedly emphysematous but clear. Heart size is normal. No pneumothorax or  pleural effusion. No acute or focal bony abnormality. IMPRESSION: Severe emphysema without acute disease. Electronically Signed   By: Inge Rise M.D.   On: 09/03/2017 12:58   Dg Chest 2 View  Result Date: 08/30/2017 CLINICAL DATA:  Cough and COPD EXAM: CHEST - 2 VIEW COMPARISON:  08/28/2017 FINDINGS: The lungs are hyperinflated with diffuse interstitial prominence. No focal airspace consolidation or pulmonary edema. No pleural effusion or pneumothorax. Normal cardiomediastinal contours. Unchanged appearance of left paraspinous mass. IMPRESSION: 1. COPD without focal airspace disease. 2. Unchanged appearance of left paramedian mass. Electronically Signed   By: Ulyses Jarred M.D.   On: 08/30/2017 18:53   Ct Chest W Contrast  Result Date: 09/18/2017 CLINICAL DATA:  LEFT lower lobe pulmonary mass. Rib pain. History of LEFT renal clear cell carcinoma in 2009, status post cryoablation of cancer recurrence in 2012. EXAM: CT CHEST WITH CONTRAST TECHNIQUE: Multidetector CT imaging of the chest was performed during intravenous contrast administration. CONTRAST:  29mL OMNIPAQUE IOHEXOL 300 MG/ML  SOLN COMPARISON:  CT chest dated 01/15/2017. Chest x-ray dated 09/17/2017. FINDINGS: Cardiovascular: No thoracic aortic aneurysm or evidence of aortic dissection. Aortic atherosclerosis. Heart size is normal. Diffuse coronary artery calcifications. Mediastinum/Nodes: Scattered small lymph nodes within the mediastinum and perihilar regions. No mass or enlarged lymph nodes seen. Esophagus is unremarkable. Small amount of debris within the trachea and central bronchi. Lungs/Pleura: Mass within the  medial aspects of the LEFT lower lobe measures 3.1 x 2.8 cm, corresponding to findings on recent chest x-rays, new compared to the earlier chest CT of 01/15/2017, highly suggestive of neoplastic mass. Patchy consolidations within the lower lobes bilaterally, dependent aspects, new compared to the earlier chest CT, stable or  increasing compared to recent chest x-rays. Small bilateral pleural effusions. Mild emphysematous changes at the lung apices. Upper Abdomen: No acute findings on limited images of the upper abdomen. Musculoskeletal: New compression fracture deformity involving the superior endplate of the T7 vertebral body, likely acute to subacute, suspected pathologic fracture. No other acute or suspicious osseous finding. IMPRESSION: 1. LEFT lower lobe mass measures 3.1 cm, almost certainly neoplastic. This could represent primary bronchogenic carcinoma or metastatic disease. Recommend tissue sampling. 2. Patchy consolidations at the bilateral lung bases, most likely pneumonia and/or aspiration. 3. Small bilateral pleural effusions. 4. New compression fracture deformity involving the superior endplate of the T7 vertebral body, approximately 30% compressed anteriorly and centrally, acute to subacute in age, suspected pathologic fracture. No associated vertebral body retropulsion or displacement. 5. Diffuse coronary artery calcifications. Aortic Atherosclerosis (ICD10-I70.0) and Emphysema (ICD10-J43.9). Electronically Signed   By: Franki Cabot M.D.   On: 09/18/2017 12:34   Mr Thoracic Spine Wo Contrast  Result Date: 09/19/2017 CLINICAL DATA:  Evaluate thoracic compression deformity. Patient has a chest mass. EXAM: MRI THORACIC SPINE WITHOUT CONTRAST TECHNIQUE: Multiplanar, multisequence MR imaging of the thoracic spine was performed. No intravenous contrast was administered. COMPARISON:  Chest CT scans 09/18/2017 and 01/15/2017. FINDINGS: Study was ordered without contrast. Sensitivity in the detection of metastatic disease is reduced. Alignment: Physiologic. Vertebrae: There is a mild superior endplate deformity of T7. There is no clear destructive process. There does not appear to be significant bone marrow edema. There is no significant retropulsion. While this deformity is new compared with prior chest CT from 01/15/2017,  it does not clearly represent metastatic disease. This abnormality is favored to represent a late subacute to chronic compression fracture, related to osteopenia. Cord:  Normal signal and morphology.  No intraspinal mass lesion. Paraspinal and other soft tissues: The LEFT paravertebral mass is silhouetted by large BILATERAL pleural effusions, and bibasilar edema or consolidation, both of which are increased since yesterday's CT. Disc levels: There is no significant disc protrusion or spinal stenosis. No intraspinal mass lesion is evident on this noncontrast scan. IMPRESSION: Mild superior endplate deformity of T7, without significant bone marrow edema, retropulsion, or marrow replacement. This abnormality is favored to represent a late subacute to chronic compression fracture, likely osteopenic in nature. Worsening BILATERAL pleural effusions and bibasilar consolidation/edema. Electronically Signed   By: Staci Righter M.D.   On: 09/19/2017 16:29   Ct Abdomen Pelvis W Contrast  Result Date: 08/22/2017 CLINICAL DATA:  Acute abdominal pain. Patient reports back and leg pain. Decreased appetite. EXAM: CT ABDOMEN AND PELVIS WITH CONTRAST TECHNIQUE: Multidetector CT imaging of the abdomen and pelvis was performed using the standard protocol following bolus administration of intravenous contrast. CONTRAST:  36mL OMNIPAQUE IOHEXOL 300 MG/ML  SOLN COMPARISON:  Chest, abdomen, and pelvis CT 01/15/2017, 02/05/2017, prior chest radiographs FINDINGS: Lower chest: Pulmonary nodule with central low density in the left lower lobe measures 2.5 cm, only partially included in the field of view. This abuts the pleura posteriorly. This likely represents increase size of a 3 mm nodule on prior exam. Hepatobiliary: Unchanged intra and extrahepatic biliary ductal dilatation, common bile duct measuring approximately 15 mm. A calcified gallstone or visualized  choledocholithiasis. Gallbladder is partially distended. No focal hepatic  lesion. Pancreas: Atrophic with parenchyma not well visualized. No peripancreatic inflammation. Spleen: Normal in size without focal abnormality. Adrenals/Urinary Tract: No adrenal nodule. Stable appearance of post ablation defect in the lower left kidney with dystrophic calcification. No hydronephrosis. No perinephric edema. No new renal lesion allowing for mild motion artifact. Urinary bladder is physiologically distended, no bladder wall thickening. Stomach/Bowel: Paucity of intra-abdominal fat limits detailed bowel evaluation. Small hiatal hernia. Fluid in the stomach with equivocal gastric wall thickening. Surgical clips along the lesser curvature of stomach. No bowel obstruction, evidence of wall thickening or inflammatory change. Appendix not visualized. Vascular/Lymphatic: Advanced aortic and branch atherosclerosis. Atherosclerotic calcification of the origin of both renal arteries. No acute vascular findings. No bulky adenopathy, evaluation for adenopathy is limited due to paucity of intra-abdominal fat. Reproductive: Uterus not definitively visualized.  No adnexal mass. Other: No free air or free fluid. Subcutaneous calcifications in the left greater than right flank, unchanged and incidental. Musculoskeletal: Bones are under mineralized. Degenerative disc disease throughout the lumbar spine. No blastic osseous lesion. Evaluation for lytic lesions limited given underlying bony under mineralization. Bilateral hip pinning. IMPRESSION: 1. Fluid in the stomach with possible gastric enhancement, may represent gastritis. 2. Partially included 2.5 cm left lower lobe pulmonary nodule, retrospectively increasing since CT 6 months prior were is faintly visualized. This may represent metastasis given history of renal cancer versus primary bronchogenic malignancy. Recommend complete chest evaluation with dedicated chest CT, this could be performed on a nonemergent basis based on patient's symptoms. 3. Chronic biliary  dilatation, unchanged from prior exams. 4.  Aortic Atherosclerosis (ICD10-I70.0). Electronically Signed   By: Jeb Levering M.D.   On: 08/22/2017 22:26   Dg Abdomen Acute W/chest  Result Date: 08/28/2017 CLINICAL DATA:  Epigastric pain. EXAM: DG ABDOMEN ACUTE W/ 1V CHEST COMPARISON:  CT of the abdomen and pelvis 08/22/2017 FINDINGS: The heart size is normal. Atherosclerotic calcifications are present at the aortic arch. Emphysematous changes are noted. The left paraspinous 2.5 cm mass is again noted. Bowel gas pattern is normal. There is no free air. No obstruction is present. Postsurgical changes are present at both hips. IMPRESSION: 1. No acute cardiopulmonary disease. 2. Left paraspinous mass. 3. Normal bowel gas pattern. No acute or focal lesion to explain epigastric pain. Electronically Signed   By: San Morelle M.D.   On: 08/28/2017 15:37   Ct Renal Stone Study  Result Date: 08/30/2017 CLINICAL DATA:  Recurring urinary tract infection. History of substance abuse, pancreatitis, renal cell carcinoma. EXAM: CT ABDOMEN AND PELVIS WITHOUT CONTRAST TECHNIQUE: Multidetector CT imaging of the abdomen and pelvis was performed following the standard protocol without IV contrast. COMPARISON:  CT abdomen and pelvis August 22, 2017 FINDINGS: Moderate respiratory motion degraded examination. LOWER CHEST: Lung bases are clear. The visualized heart size is normal. Small pericardial effusion. HEPATOBILIARY: Negative liver. Similar mild biliary dilatation better demonstrated on recent contrast-enhanced CT. No gallbladder distension or definite cholelithiasis though limited by motion. PANCREAS: Normal. SPLEEN: Punctate splenic granuloma. ADRENALS/URINARY TRACT: Kidneys are orthotopic, demonstrating normal size and morphology. Partially calcified 13 mm LEFT lower pole mass at site of prior ablation. No nephrolithiasis, hydronephrosis; limited assessment for renal masses by nonenhanced CT. The unopacified  ureters are normal in course and caliber. Urinary bladder is partially distended and unremarkable. Normal adrenal glands. STOMACH/BOWEL: The stomach, small and large bowel are normal in course and caliber without inflammatory changes, sensitivity decreased by lack of enteric contrast. Surgical  suture material at gastric antrum. Mild stool distended rectum with moderate amount of retained large bowel stool. Normal appendix. VASCULAR/LYMPHATIC: Aortoiliac vessels are normal in course and caliber. Severe calcific atherosclerosis. No lymphadenopathy by CT size criteria. REPRODUCTIVE: Status post hysterectomy. OTHER: No intraperitoneal free fluid or free air. MUSCULOSKELETAL: Non-acute. Streak artifact from bilateral femoral neck pinning. Cachexia. Osteopenia. Grade 1 L2-3 retrolisthesis. IMPRESSION: 1. Moderately motion degraded examination. 2. No urolithiasis or obstructive uropathy. Post treatment changes LEFT kidney. 3. Moderate amount of retained large bowel stool without bowel obstruction. Aortic Atherosclerosis (ICD10-I70.0). Electronically Signed   By: Elon Alas M.D.   On: 08/30/2017 19:08    Lab Data:  CBC: Recent Labs  Lab 09/15/17 1509  09/16/17 1750 09/17/17 1313 09/17/17 1614 09/18/17 0802 09/19/17 1106 09/20/17 0348  WBC 12.0*  --  10.8* 8.8 7.5 5.1 8.0 8.0  NEUTROABS 9.6*  --  8.4* 6.6  --   --   --   --   HGB 12.7   < > 12.0 11.4* 8.6* 8.5* 7.8* 9.1*  HCT 38.4   < > 36.3 35.3* 26.9* 26.3* 23.7* 28.5*  MCV 87.5  --  87.9 87.8 87.9 87.4 85.6 86.9  PLT 499*  --  423* 426* 325 275 173 242   < > = values in this interval not displayed.   Basic Metabolic Panel: Recent Labs  Lab 09/16/17 1750 09/17/17 1313 09/17/17 1614 09/18/17 0802 09/19/17 1106 09/20/17 0348  NA 142 142  --  143 140 139  K 4.5 3.8  --  4.0 4.7 4.6  CL 103 105  --  112* 115* 114*  CO2 27 27  --  24 18* 20*  GLUCOSE 77 90  --  86 128* 94  BUN 21 18  --  11 8 9   CREATININE 0.91 0.78 0.69 0.70 0.63  0.58  CALCIUM 9.8 9.5  --  8.3* 8.2* 8.2*   GFR: Estimated Creatinine Clearance: 37.1 mL/min (by C-G formula based on SCr of 0.58 mg/dL). Liver Function Tests: Recent Labs  Lab 09/17/17 1313  AST 16  ALT 11  ALKPHOS 89  BILITOT 0.4  PROT 7.2  ALBUMIN 3.5   No results for input(s): LIPASE, AMYLASE in the last 168 hours. No results for input(s): AMMONIA in the last 168 hours. Coagulation Profile: No results for input(s): INR, PROTIME in the last 168 hours. Cardiac Enzymes: No results for input(s): CKTOTAL, CKMB, CKMBINDEX, TROPONINI in the last 168 hours. BNP (last 3 results) No results for input(s): PROBNP in the last 8760 hours. HbA1C: No results for input(s): HGBA1C in the last 72 hours. CBG: No results for input(s): GLUCAP in the last 168 hours. Lipid Profile: No results for input(s): CHOL, HDL, LDLCALC, TRIG, CHOLHDL, LDLDIRECT in the last 72 hours. Thyroid Function Tests: No results for input(s): TSH, T4TOTAL, FREET4, T3FREE, THYROIDAB in the last 72 hours. Anemia Panel: No results for input(s): VITAMINB12, FOLATE, FERRITIN, TIBC, IRON, RETICCTPCT in the last 72 hours. Urine analysis:    Component Value Date/Time   COLORURINE AMBER (A) 09/17/2017 1912   APPEARANCEUR CLOUDY (A) 09/17/2017 1912   LABSPEC 1.019 09/17/2017 1912   PHURINE 5.0 09/17/2017 1912   GLUCOSEU NEGATIVE 09/17/2017 1912   HGBUR MODERATE (A) 09/17/2017 1912   BILIRUBINUR NEGATIVE 09/17/2017 1912   BILIRUBINUR negative 06/03/2017 1120   KETONESUR NEGATIVE 09/17/2017 1912   PROTEINUR NEGATIVE 09/17/2017 1912   UROBILINOGEN 0.2 06/03/2017 1120   UROBILINOGEN 0.2 03/15/2017 1004   NITRITE NEGATIVE 09/17/2017 1912  LEUKOCYTESUR SMALL (A) 09/17/2017 1912     Gadiel John M.D. [facility] 09/20/2017, 2:09 PM  Pager: [phone] Between 7am to 7pm - call Pager - [phone]  After 7pm go to [email or web address] - password [email or web address]  Call night coverage person covering after 7pm

## 2017-09-20 NOTE — Progress Notes (Signed)
Patient ID: [name], female   DOB: April 10, 1950, 67 y.o.   MRN: [id number] Latest MRI thoracic spine reviewed by Dr. Earleen Newport. Findings reveal no edema at T7 fracture site which is c/w chronic nature. KP not indicated. Please page Dr. Earleen Newport at [phone] with any additional questions.

## 2017-09-20 NOTE — Telephone Encounter (Signed)
Spoke with patient husband Legrand Como and he states that neither nursing home facility would accept his wife because she takes Suboxone. Legrand Como states that he is not sure if anymore paperwork is needed at this time because patient is in hospital. He will give [place] a call if anything else needed.

## 2017-09-20 NOTE — Progress Notes (Signed)
PT Cancellation Note  Patient Details Name: [name] MRN: [id number] DOB: 03-Dec-1950   Cancelled Treatment:    Reason Eval/Treat Not Completed: Fatigue/lethargy limiting ability to participate;Pain limiting ability to participate. Patient declined at this time/ Will check back another time. Encouraged patient to participate. States" I amd sick and don't feel well".   Tresa Endo [place] Pager [phone] Office [phone]  09/20/2017, 11:52 AM

## 2017-09-21 DIAGNOSIS — F419 Anxiety disorder, unspecified: Secondary | ICD-10-CM

## 2017-09-21 DIAGNOSIS — J189 Pneumonia, unspecified organism: Principal | ICD-10-CM

## 2017-09-21 DIAGNOSIS — G8929 Other chronic pain: Secondary | ICD-10-CM

## 2017-09-21 DIAGNOSIS — N390 Urinary tract infection, site not specified: Secondary | ICD-10-CM

## 2017-09-21 LAB — BASIC METABOLIC PANEL
ANION GAP: 7 (ref 5–15)
BUN: 10 mg/dL (ref 8–23)
CO2: 22 mmol/L (ref 22–32)
Calcium: 8.7 mg/dL — ABNORMAL LOW (ref 8.9–10.3)
Chloride: 109 mmol/L (ref 98–111)
Creatinine, Ser: 0.62 mg/dL (ref 0.44–1.00)
GFR calc Af Amer: >60 mL/min (ref >60)
GFR calc non Af Amer: >60 mL/min (ref >60)
GLUCOSE: 77 mg/dL (ref 70–99)
POTASSIUM: 4.5 mmol/L (ref 3.5–5.1)
Sodium: 138 mmol/L (ref 135–145)

## 2017-09-21 LAB — CBC
HEMATOCRIT: 26.2 % — AB (ref 36.0–46.0)
Hemoglobin: 8.7 g/dL — ABNORMAL LOW (ref 12.0–15.0)
MCH: 28.2 pg (ref 26.0–34.0)
MCHC: 33.2 g/dL (ref 30.0–36.0)
MCV: 84.8 fL (ref 78.0–100.0)
Platelets: 307 10*3/uL (ref 150–400)
RBC: 3.09 MIL/uL — AB (ref 3.87–5.11)
RDW: 15.1 % (ref 11.5–15.5)
WBC: 8.3 10*3/uL (ref 4.0–10.5)

## 2017-09-21 LAB — CULTURE, BLOOD (ROUTINE X 2)
CULTURE: NO GROWTH
Culture: NO GROWTH
Special Requests: ADEQUATE

## 2017-09-21 MED ORDER — MORPHINE SULFATE (PF) 2 MG/ML IV SOLN
1.0000 mg | INTRAVENOUS | Status: DC | PRN
  Administered 2017-09-21 – 2017-09-24 (×17): 1 mg via INTRAVENOUS
  Filled 2017-09-21 (×18): qty 1

## 2017-09-21 NOTE — Progress Notes (Signed)
PROGRESS NOTE    [name]  OHY:[id number] DOB: 1950/06/27 DOA: 09/17/2017 PCP: Azzie Glatter, FNP   Brief Narrative:  Patient is a 67 year old female with chronic pancreatitis, substance abuse, COPD, history of renal CA, chronic pancreatitis presented to ED with shortness of breath and inability to take oral antibiotics.  Per patient she could not eat over the last few days and her nose for bothering her, she was not able to take oral antibiotics that were prescribed.  Normally she is on 4 L of O2 at home. Assessment & Plan   Community-acquired pneumonia -Patient was given antibiotics as an outpatient however was not able to swallow those pills -Chest x-ray -Blood cultures 09/16/2017 showed no growth; blood cultures from 08/30/2017 showed no growth -Patient initially placed on Rocephin and azithromycin and transition to Augmentin -MRI thoracic spine incidentally showed worsening bilateral pleural effusions and bibasilar consolidation/edema, patient was given 1 dose of Lasix 20 mg IV.  She refused a BNP  Enterococcus UTI -Urine culture showed >100k Enterococcus faecalis which is pansensitive -She was on Rocephin however was transitioned to oral Augmentin  History of left renal cancer/ Left lower lobe pulmonary nodule -status post cryoablation and recurrence in 2012 -Review of hospitalist discussed with Dr. Earlie Server who recommended CT chest with outpatient PET/CT which has been ordered on 9/23 -CT chest showed left lower lobe mass 3.1 cm which could represent primary bronchogenic carcinoma or metastatic disease, patchy consolidation of bilateral lung bases most likely pneumonia and/or aspiration -CT results have been discussed with patient as well as family  Acute/subacute T7 fracture -New compression fracture deformity involving superior endplate of T7 vertebral body, acute to subacute, possibly pathological fracture.  Patient denied recent fall -PT recommended SNF -Continue  pain control with scheduled tramadol Tylenol Robaxin and Lidoderm patch -MRI thoracic spine showed mild superior endplate deformity of T7, possibly chronic compression fracture likely osteopenic.  Per interventional radiology, no indication of kyphoplasty at this time  Acute on chronic pain with polysubstance abuse history -Patient's husband very insistent on patient receiving oral pain medication.  States that she was on Suboxone as an outpatient.  Per [place] database, patient did have Suboxone prescription filled on 08/29/2017. -Called and discussed with Dr. Angie Fava, pain management, who recommended not restarting her Suboxone at this time and following up with her in the clinic.  He states that patient has had a history of substance abuse.  He also recommended that patient see him in the clinic as soon as she is discharged.  Felt that if needed IV pain medication could be given for acute pain.  Would not recommend oral narcotics at this time.  Please also see Dr. Josem Kaufmann note from 09/19/2017, as she also discussed patient with Dr.Dakwa.   Acute anxiety -Continue Ativan -Patient has Artie been made aware that she would not be receiving a prescription upon discharge and will need to follow-up with her primary physician or psychiatrist  Severe protein calorie malnutrition -Nutrition consulted, continue supplements  DVT Prophylaxis  Lovenox  Code Status: Full  Family Communication: Husband at bedside  Disposition Plan: Admitted. Pending SNF placement. Social work consulted  Consultants Oncology, Dr. Earlie Server via phone Interventional radiology Dr. Angie Fava, pain management, via phone  Procedures  None  Antibiotics   Anti-infectives (From admission, onward)   Start     Dose/Rate Route Frequency Ordered Stop   09/20/17 1200  amoxicillin-clavulanate (AUGMENTIN) 875-125 MG per tablet 1 tablet     1 tablet Oral 2 times daily  with meals 09/20/17 1111     09/19/17 1000  azithromycin (ZITHROMAX) tablet  500 mg     500 mg Oral Daily 09/19/17 0816     09/18/17 1400  azithromycin (ZITHROMAX) 500 mg in sodium chloride 0.9 % 250 mL IVPB  Status:  Discontinued     500 mg 250 mL/hr over 60 Minutes Intravenous Every 24 hours 09/17/17 1547 09/19/17 0816   09/17/17 1130  cefTRIAXone (ROCEPHIN) 1 g in sodium chloride 0.9 % 100 mL IVPB  Status:  Discontinued     1 g 200 mL/hr over 30 Minutes Intravenous Every 24 hours 09/17/17 1115 09/20/17 1111   09/17/17 1100  ceFEPIme (MAXIPIME) 2 g in sodium chloride 0.9 % 100 mL IVPB  Status:  Discontinued     2 g 200 mL/hr over 30 Minutes Intravenous  Once 09/17/17 1054 09/17/17 1115   09/17/17 1100  azithromycin (ZITHROMAX) 500 mg in sodium chloride 0.9 % 250 mL IVPB  Status:  Discontinued     500 mg 250 mL/hr over 60 Minutes Intravenous Every 24 hours 09/17/17 1054 09/17/17 1559      Subjective:   [name] seen and examined today.  Patient states she has pain all over and has been asking for pain medication.  Currently she denies chest pain, shortness breath, abdominal pain, nausea vomiting, diarrhea constipation, dizziness or headache.  Her husband feels that she is in severe pain and needs oral pain medications.  Objective:   Vitals:   09/20/17 2019 09/20/17 2151 09/21/17 0623 09/21/17 0912  BP:  120/78 (!) 145/88   Pulse:  70 69   Resp:  20 20   Temp:  98.5 F (36.9 C) 99 F (37.2 C)   TempSrc:  Oral Oral   SpO2: 99% 100% 100% 94%  Weight:      Height:        Intake/Output Summary (Last 24 hours) at 09/21/2017 1451 Last data filed at 09/21/2017 0631 Gross per 24 hour  Intake 470 ml  Output 1675 ml  Net -1205 ml   Filed Weights   09/17/17 1221 09/17/17 1643  Weight: 30.3 kg 33.1 kg    Exam  General: Well developed, cachectic, no apparent distress  HEENT: NCAT, mucous membranes moist.   Neck: Supple  Cardiovascular: S1 S2 auscultated, no murmurs  Respiratory: Clear to auscultation bilaterally with equal chest  rise  Abdomen: Soft, nontender, nondistended, + bowel sounds  Extremities: warm dry without cyanosis clubbing or edema  Neuro: AAOx3, nonfocal  Psych: Appropriate mood and affect   Data Reviewed: I have personally reviewed following labs and imaging studies  CBC: Recent Labs  Lab 09/15/17 1509  09/16/17 1750 09/17/17 1313 09/17/17 1614 09/18/17 0802 09/19/17 1106 09/20/17 0348 09/21/17 0842  WBC 12.0*  --  10.8* 8.8 7.5 5.1 8.0 8.0 8.3  NEUTROABS 9.6*  --  8.4* 6.6  --   --   --   --   --   HGB 12.7   < > 12.0 11.4* 8.6* 8.5* 7.8* 9.1* 8.7*  HCT 38.4   < > 36.3 35.3* 26.9* 26.3* 23.7* 28.5* 26.2*  MCV 87.5  --  87.9 87.8 87.9 87.4 85.6 86.9 84.8  PLT 499*  --  423* 426* 325 275 173 242 307   < > = values in this interval not displayed.   Basic Metabolic Panel: Recent Labs  Lab 09/17/17 1313 09/17/17 1614 09/18/17 0802 09/19/17 1106 09/20/17 0348 09/21/17 0842  NA 142  --  143  140 139 138  K 3.8  --  4.0 4.7 4.6 4.5  CL 105  --  112* 115* 114* 109  CO2 27  --  24 18* 20* 22  GLUCOSE 90  --  86 128* 94 77  BUN 18  --  11 8 9 10   CREATININE 0.78 0.69 0.70 0.63 0.58 0.62  CALCIUM 9.5  --  8.3* 8.2* 8.2* 8.7*   GFR: Estimated Creatinine Clearance: 37.1 mL/min (by C-G formula based on SCr of 0.62 mg/dL). Liver Function Tests: Recent Labs  Lab 09/17/17 1313  AST 16  ALT 11  ALKPHOS 89  BILITOT 0.4  PROT 7.2  ALBUMIN 3.5   No results for input(s): LIPASE, AMYLASE in the last 168 hours. No results for input(s): AMMONIA in the last 168 hours. Coagulation Profile: No results for input(s): INR, PROTIME in the last 168 hours. Cardiac Enzymes: No results for input(s): CKTOTAL, CKMB, CKMBINDEX, TROPONINI in the last 168 hours. BNP (last 3 results) No results for input(s): PROBNP in the last 8760 hours. HbA1C: No results for input(s): HGBA1C in the last 72 hours. CBG: No results for input(s): GLUCAP in the last 168 hours. Lipid Profile: No results for  input(s): CHOL, HDL, LDLCALC, TRIG, CHOLHDL, LDLDIRECT in the last 72 hours. Thyroid Function Tests: No results for input(s): TSH, T4TOTAL, FREET4, T3FREE, THYROIDAB in the last 72 hours. Anemia Panel: No results for input(s): VITAMINB12, FOLATE, FERRITIN, TIBC, IRON, RETICCTPCT in the last 72 hours. Urine analysis:    Component Value Date/Time   COLORURINE AMBER (A) 09/17/2017 1912   APPEARANCEUR CLOUDY (A) 09/17/2017 1912   LABSPEC 1.019 09/17/2017 1912   PHURINE 5.0 09/17/2017 1912   GLUCOSEU NEGATIVE 09/17/2017 1912   HGBUR MODERATE (A) 09/17/2017 1912   BILIRUBINUR NEGATIVE 09/17/2017 1912   BILIRUBINUR negative 06/03/2017 1120   KETONESUR NEGATIVE 09/17/2017 1912   PROTEINUR NEGATIVE 09/17/2017 1912   UROBILINOGEN 0.2 06/03/2017 1120   UROBILINOGEN 0.2 03/15/2017 1004   NITRITE NEGATIVE 09/17/2017 1912   LEUKOCYTESUR SMALL (A) 09/17/2017 1912   Sepsis Labs: @LABRCNTIP (procalcitonin:4,lacticidven:4)  ) Recent Results (from the past 240 hour(s))  Blood culture (routine x 2)     Status: None   Collection Time: 09/16/17  5:20 PM  Result Value Ref Range Status   Specimen Description   Final    BLOOD RIGHT ANTECUBITAL Performed at [facility], [place] [address] [place], [name] [address]    Special Requests   Final    BOTTLES DRAWN AEROBIC AND ANAEROBIC Blood Culture results may not be optimal due to an inadequate volume of blood received in culture bottles Performed at [facility] [address], [place], [place] [address]    Culture   Final    NO GROWTH 5 DAYS Performed at [facility], [place] [address], [place], [place] [address]    Report Status 09/21/2017 FINAL  Final  Blood culture (routine x 2)     Status: None   Collection Time: 09/16/17  6:15 PM  Result Value Ref Range Status   Specimen Description   Final    BLOOD RIGHT ARM Performed at [facility] [address], [place], [place] [address]     Special Requests   Final    BOTTLES DRAWN AEROBIC AND ANAEROBIC Blood Culture adequate volume Performed at [facility] [address], [place],  [address]    Culture   Final    NO GROWTH 5 DAYS Performed  at [facility], [place] [address], [place], [place] [address]    Report Status 09/21/2017 FINAL  Final  Urine Culture     Status: Abnormal   Collection Time: 09/17/17  7:12 PM  Result Value Ref Range Status   Specimen Description   Final    URINE, CLEAN CATCH Performed at [facility], [place] [address] [place], [place] [address]    Special Requests   Final    NONE Performed at [facility], [place] [address] [place], [place] [address]    Culture >=100,000 COLONIES/mL ENTEROCOCCUS FAECALIS (A)  Final   Report Status 09/20/2017 FINAL  Final   Organism ID, Bacteria ENTEROCOCCUS FAECALIS (A)  Final      Susceptibility   Enterococcus faecalis - MIC*    AMPICILLIN <=2 SENSITIVE Sensitive     LEVOFLOXACIN 1 SENSITIVE Sensitive     NITROFURANTOIN <=16 SENSITIVE Sensitive     VANCOMYCIN 1 SENSITIVE Sensitive     * >=100,000 COLONIES/mL ENTEROCOCCUS FAECALIS      Radiology Studies: No results found.   Scheduled Meds: . acetaminophen  1,000 mg Oral Q8H  . amoxicillin-clavulanate  1 tablet Oral BID WC  . azithromycin  500 mg Oral Daily  . [START ON 09/22/2017] cloNIDine  0.1 mg Oral QAC breakfast  . feeding supplement  1 Container Oral BID BM  . lidocaine  1 patch Transdermal Q24H  . magnesium citrate  1 Bottle Oral Once  . mouth rinse  15 mL Mouth Rinse BID  . methocarbamol  500 mg Oral TID  . mirabegron ER  25 mg Oral Daily  . mometasone-formoterol  2 puff Inhalation BID  . multivitamin  15 mL Oral Daily  . tamsulosin  0.4 mg Oral Daily  . traMADol  50 mg Oral Q8H   Continuous Infusions:   LOS: 2 days   Time Spent in minutes   45 minutes (discussing patient with Dr. Angie Fava, pain management, reviewing  patient's chart and discussing findings with patient and husband at bedside)  Cristal Ford D.O. on 09/21/2017 at 2:51 PM  Between 7am to 7pm - Please see pager noted on [email or web address]  After 7pm go to [email or web address]  And look for the night coverage person covering for me after hours  [facility] Office  [phone]

## 2017-09-21 NOTE — Evaluation (Signed)
Physical Therapy Evaluation Patient Details Name: [name] MRN: [id number] DOB: 11/16/1950 Today's Date: 09/21/2017   History of Present Illness  67 y.o. female with medical history significant for chronic alcoholic pancreatitis, renal cancer , gastric ulcer, COPD GOLD stage III, HTN, drug-seeking behavior, was brought to the ED 09/17/17 with recent diagnosis of pneumonia. T10 compression fracture. No planned treatment.  Clinical Impression  The patient on consented to  Transfer to chair and back to bed.  Pt admitted with above diagnosis. Pt currently with functional limitations due to the deficits listed below (see PT Problem List). Pt will benefit from skilled PT to increase their independence and safety with mobility to allow discharge to the venue listed below.       Follow Up Recommendations SNF    Equipment Recommendations  None recommended by PT    Recommendations for Other Services       Precautions / Restrictions Precautions Precautions: Fall      Mobility  Bed Mobility Overal bed mobility: Needs Assistance Bed Mobility: Sidelying to Sit;Sit to Supine   Sidelying to sit: Independent Supine to sit: Independent Sit to supine: Independent      Transfers Overall transfer level: Needs assistance   Transfers: Sit to/from Stand;Stand Pivot Transfers Sit to Stand: Min assist Stand pivot transfers: Min assist       General transfer comment: patient only consented to transfer to chair and back to bed  Ambulation/Gait             General Gait Details: nt  Stairs            Wheelchair Mobility    Modified Rankin (Stroke Patients Only)       Balance                                             Pertinent Vitals/Pain Pain Assessment: 0-10 Pain Score: 9  Pain Location: back Pain Descriptors / Indicators: Discomfort;Grimacing Pain Intervention(s): RN gave pain meds during session    Home Living Family/patient expects  to be discharged to:: Private residence Living Arrangements: Spouse/significant other Available Help at Discharge: Family Type of Home: House         Home Equipment: Environmental consultant - 2 wheels;Bedside commode;Walker - 4 wheels Additional Comments: husband works during day; per pt she is independent when she feel slike it    Prior Function Level of Independence: Needs assistance;Independent with assistive device(s)               Hand Dominance        Extremity/Trunk Assessment   Upper Extremity Assessment Upper Extremity Assessment: Generalized weakness    Lower Extremity Assessment Lower Extremity Assessment: Generalized weakness    Cervical / Trunk Assessment Cervical / Trunk Assessment: Kyphotic  Communication   Communication: No difficulties  Cognition Arousal/Alertness: Awake/alert Behavior During Therapy: WFL for tasks assessed/performed Overall Cognitive Status: Within Functional Limits for tasks assessed                                        General Comments      Exercises     Assessment/Plan    PT Assessment Patient needs continued PT services  PT Problem List Decreased strength;Decreased activity tolerance;Pain;Decreased mobility;Decreased safety awareness;Decreased knowledge of use of DME;Decreased  knowledge of precautions       PT Treatment Interventions DME instruction;Gait training;Functional mobility training;Therapeutic activities;Therapeutic exercise;Patient/family education    PT Goals (Current goals can be found in the Care Plan section)  Acute Rehab PT Goals Patient Stated Goal: to go somewhere for 15 days PT Goal Formulation: With patient Time For Goal Achievement: 10/05/17 Potential to Achieve Goals: Good    Frequency Min 3X/week   Barriers to discharge Decreased caregiver support      Co-evaluation               AM-PAC PT "6 Clicks" Daily Activity  Outcome Measure Difficulty turning over in bed (including  adjusting bedclothes, sheets and blankets)?: A Little Difficulty moving from lying on back to sitting on the side of the bed? : A Little Difficulty sitting down on and standing up from a chair with arms (e.g., wheelchair, bedside commode, etc,.)?: A Lot Help needed moving to and from a bed to chair (including a wheelchair)?: A Lot Help needed walking in hospital room?: A Lot Help needed climbing 3-5 steps with a railing? : Total 6 Click Score: 13    End of Session   Activity Tolerance: Patient tolerated treatment well Patient left: in bed;with call bell/phone within reach;with bed alarm set;with nursing/sitter in room Nurse Communication: Mobility status PT Visit Diagnosis: Unsteadiness on feet (R26.81)    Time: 1031-1040 PT Time Calculation (min) (ACUTE ONLY): 9 min   Charges:   PT Evaluation $PT Eval Low Complexity: [place] Pager [phone] Office [phone]   Claretha Cooper 09/21/2017, 12:54 PM

## 2017-09-22 NOTE — [place] FL2 (Addendum)
[place] MEDICAID FL2 LEVEL OF CARE SCREENING TOOL     IDENTIFICATION  Patient Name: MINDIE RAWDON Birthdate: Jan 08, 1950 Sex: female Admission Date (Current Location): 09/17/2017  [facility] and [place] Number:  Herbalist and Address:  [facility],  [place] [address], [place]      Provider Number: [id number]  Attending Physician Name and Address:  Cristal Ford, DO  Relative Name and Phone Number:       Current Level of Care: Hospital Recommended Level of Care: [place] Prior Approval Number:    Date Approved/Denied:   PASRR Number:   [id number]  Discharge Plan: SNF    Current Diagnoses: Patient Active Problem List   Diagnosis Date Noted  . Pneumonia 09/19/2017  . PNA (pneumonia) 09/17/2017  . Osteoarthritis of facet joint of lumbar spine 07/26/2017  . Low back pain radiating to both legs 07/26/2017  . Substance-induced psychotic disorder with delusions ([place]) 07/12/2017  . Failure to thrive in adult 06/21/2017  . Pressure injury of skin 04/06/2017  . Pressure injury of skin of right ischial tuberosity region: Stage 2 04/06/2017  . Acute urinary retention 04/04/2017  . Benzodiazepine dependence ([place])   . AKI (acute kidney injury) ([place]) 04/03/2017  . Hip fracture, right ([place]) 06/02/2016  . Narcotic addiction ([place]) 02/29/2016  . Abdominal pain, chronic, epigastric- due to chronic pancreatitis 02/29/2016  . COPD (chronic obstructive pulmonary disease) ([place]) 02/28/2016  . Cough 09/03/2015  . Abdominal pain 01/03/2015  . Hyponatremia 01/03/2015  . Hypocalcemia 01/03/2015  . Underweight 01/03/2015  . COPD GOLD III with reversible component  07/17/2014  . DTs (delirium tremens) ([place]) 06/02/2013  . Chronic alcoholic pancreatitis ([place]) 06/02/2013  . Lactic acidosis 06/02/2013  . Recurrent acute pancreatitis 03/28/2013  . Alcohol withdrawal ([place]) 03/28/2013  . Pancreatitis 03/09/2013  . Tobacco abuse 03/09/2013  .  Alcohol withdrawal syndrome with perceptual disturbance ([place]) 03/09/2013  . Acute alcoholic pancreatitis 15/40/0867  . Benzodiazepine withdrawal ([place]) 02/09/2013  . Protein-calorie malnutrition, severe ([place]) 02/09/2013  . Hypokalemia 02/27/2011  . Abdominal pain, acute 02/25/2011  . Nausea and vomiting 02/25/2011  . Thrombocytopenia ([place]) 02/25/2011  . COPD with acute exacerbation ([place]) 02/25/2011  . HTN (hypertension), malignant 02/25/2011  . Malignant neoplasm of kidney excluding renal pelvis ([place]) 10/18/2008  . Anxiety state 10/18/2008  . GERD 10/18/2008  . PANCREATITIS, CHRONIC- atrophic pancreas 10/18/2008  . DYSPNEA 10/18/2008  . GASTRIC ULCER, HX OF 10/18/2008    Orientation RESPIRATION BLADDER Height & Weight     Self, Time, Situation, Place  O2(4 liters) Continent Weight: 72 lb 15.6 oz (33.1 kg) Height:  5\' 4"  (162.6 cm)  BEHAVIORAL SYMPTOMS/MOOD NEUROLOGICAL BOWEL NUTRITION STATUS        Diet(see discharge summary for diet )  AMBULATORY STATUS COMMUNICATION OF NEEDS Skin   Limited Assist Verbally Normal                       Personal Care Assistance Level of Assistance  Bathing, Feeding, Dressing Bathing Assistance: Limited assistance Feeding assistance: Independent Dressing Assistance: Limited assistance     Functional Limitations Info             SPECIAL CARE FACTORS FREQUENCY  PT (By licensed PT), OT (By licensed OT)     PT Frequency: 5 OT Frequency: 5            Contractures      Additional Factors Info  Code Status, Allergies Code Status Info:  Full Code Allergies Info: ASPIRIN, CHLORPROMAZINE HCL            Current Medications (09/22/2017):  This is the current hospital active medication list Current Facility-Administered Medications  Medication Dose Route Frequency Provider Last Rate Last Dose  . acetaminophen (TYLENOL) tablet 1,000 mg  1,000 mg Oral Q8H Rai, Ripudeep K, MD   1,000 mg at 09/21/17 2200  . amoxicillin-clavulanate  (AUGMENTIN) 875-125 MG per tablet 1 tablet  1 tablet Oral BID WC Rai, Ripudeep K, MD   1 tablet at 09/22/17 0852  . azithromycin (ZITHROMAX) tablet 500 mg  500 mg Oral Daily Adrian Saran, RPH   500 mg at 09/22/17 1039  . cloNIDine (CATAPRES) tablet 0.1 mg  0.1 mg Oral QAC breakfast Nita Sells, MD   0.1 mg at 09/22/17 0852  . feeding supplement (BOOST / RESOURCE BREEZE) liquid 1 Container  1 Container Oral BID BM Rai, Vernelle Emerald, MD   1 Container at 09/21/17 1814  . lidocaine (LIDODERM) 5 % 1 patch  1 patch Transdermal Q24H Rai, Ripudeep K, MD   1 patch at 09/21/17 1420  . loperamide (IMODIUM) capsule 2-4 mg  2-4 mg Oral PRN Nita Sells, MD      . LORazepam (ATIVAN) tablet 0.5 mg  0.5 mg Oral Q8H PRN Rai, Ripudeep K, MD   0.5 mg at 09/22/17 0625  . magnesium citrate solution 1 Bottle  1 Bottle Oral Once Rai, Ripudeep K, MD      . MEDLINE mouth rinse  15 mL Mouth Rinse BID Nita Sells, MD   15 mL at 09/22/17 1042  . methocarbamol (ROBAXIN) tablet 500 mg  500 mg Oral TID Rai, Ripudeep K, MD   500 mg at 09/21/17 2159  . mirabegron ER (MYRBETRIQ) tablet 25 mg  25 mg Oral Daily Nita Sells, MD   25 mg at 09/22/17 1041  . mometasone-formoterol (DULERA) 200-5 MCG/ACT inhaler 2 puff  2 puff Inhalation BID Nita Sells, MD   2 puff at 09/22/17 0828  . morphine 2 MG/ML injection 1 mg  1 mg Intravenous Q4H PRN Cristal Ford, DO   1 mg at 09/22/17 1101  . multivitamin liquid 15 mL  15 mL Oral Daily Rai, Ripudeep K, MD   15 mL at 09/22/17 1044  . ondansetron (ZOFRAN-ODT) disintegrating tablet 4 mg  4 mg Oral Q6H PRN Nita Sells, MD   4 mg at 09/21/17 2221  . tamsulosin (FLOMAX) capsule 0.4 mg  0.4 mg Oral Daily Nita Sells, MD   0.4 mg at 09/22/17 1044  . tiotropium (SPIRIVA) inhalation capsule 18 mcg  18 mcg Inhalation Daily PRN Nita Sells, MD      . traMADol Veatrice Bourbon) tablet 50 mg  50 mg Oral Q8H Rai, Ripudeep K, MD   50 mg at  09/22/17 1017     Discharge Medications: Please see discharge summary for a list of discharge medications.  Relevant Imaging Results:  Relevant Lab Results:   Additional Information [id number]  Weston Anna, LCSW

## 2017-09-22 NOTE — Progress Notes (Signed)
PROGRESS NOTE    [name]  ONG:[id number] DOB: Mar 03, 1950 DOA: 09/17/2017 PCP: Azzie Glatter, FNP   Brief Narrative:  Patient is a 67 year old female with chronic pancreatitis, substance abuse, COPD, history of renal CA, chronic pancreatitis presented to ED with shortness of breath and inability to take oral antibiotics.  Per patient she could not eat over the last few days and her nose for bothering her, she was not able to take oral antibiotics that were prescribed.  Normally she is on 4 L of O2 at home. Assessment & Plan   Community-acquired pneumonia -Patient was given antibiotics as an outpatient however was not able to swallow those pills -Chest x-ray -Blood cultures 09/16/2017 showed no growth; blood cultures from 08/30/2017 showed no growth -Patient initially placed on Rocephin and azithromycin and transition to Augmentin -MRI thoracic spine incidentally showed worsening bilateral pleural effusions and bibasilar consolidation/edema, patient was given 1 dose of Lasix 20 mg IV.  She refused a BNP  Enterococcus UTI -Urine culture showed >100k Enterococcus faecalis which is pansensitive -She was on Rocephin however was transitioned to oral Augmentin  History of left renal cancer/ Left lower lobe pulmonary nodule -status post cryoablation and recurrence in 2012 -Review of hospitalist discussed with Dr. Earlie Server who recommended CT chest with outpatient PET/CT which has been ordered on 9/23 -CT chest showed left lower lobe mass 3.1 cm which could represent primary bronchogenic carcinoma or metastatic disease, patchy consolidation of bilateral lung bases most likely pneumonia and/or aspiration -CT results have been discussed with patient as well as family  Acute/subacute T7 fracture -New compression fracture deformity involving superior endplate of T7 vertebral body, acute to subacute, possibly pathological fracture.  Patient denied recent fall -PT recommended SNF -Continue  pain control with scheduled tramadol Tylenol Robaxin and Lidoderm patch -Low dose IV morphine added (see discussion below) -MRI thoracic spine showed mild superior endplate deformity of T7, possibly chronic compression fracture likely osteopenic.  Per interventional radiology, no indication of kyphoplasty at this time -Social work consulted, pending placement  Acute on chronic pain with polysubstance abuse history -Patient's husband very insistent on patient receiving oral pain medication.  States that she was on Suboxone as an outpatient.  Per [place] database, patient did have Suboxone prescription filled on 08/29/2017. -Called and discussed with Dr. Angie Fava, pain management, who recommended not restarting her Suboxone at this time and following up with her in the clinic.  He states that patient has had a history of substance abuse.  He also recommended that patient see him in the clinic as soon as she is discharged.  Felt that if needed IV pain medication could be given for acute pain.  Would not recommend oral narcotics at this time.  Please also see Dr. Josem Kaufmann note from 09/19/2017, as she also discussed patient with Dr.Dakwa.  Also discussed that SNF are sometimes reluctant to take patients on suboxone; Dr. Angie Fava stated he would be glad to continue being the prescriber if patient follows up with him in the clinic.   Acute anxiety -Continue Ativan -Patient has Artie been made aware that she would not be receiving a prescription upon discharge and will need to follow-up with her primary physician or psychiatrist  Severe protein calorie malnutrition -Nutrition consulted, continue supplements  DVT Prophylaxis  Lovenox  Code Status: Full  Family Communication: None at bedside  Disposition Plan: Admitted. Pending SNF placement. Social work consulted  Consultants Oncology, Dr. Earlie Server via phone Interventional radiology Dr. Angie Fava, pain management, via phone  Procedures  None  Antibiotics     Anti-infectives (From admission, onward)   Start     Dose/Rate Route Frequency Ordered Stop   09/20/17 1200  amoxicillin-clavulanate (AUGMENTIN) 875-125 MG per tablet 1 tablet     1 tablet Oral 2 times daily with meals 09/20/17 1111     09/19/17 1000  azithromycin (ZITHROMAX) tablet 500 mg     500 mg Oral Daily 09/19/17 0816     09/18/17 1400  azithromycin (ZITHROMAX) 500 mg in sodium chloride 0.9 % 250 mL IVPB  Status:  Discontinued     500 mg 250 mL/hr over 60 Minutes Intravenous Every 24 hours 09/17/17 1547 09/19/17 0816   09/17/17 1130  cefTRIAXone (ROCEPHIN) 1 g in sodium chloride 0.9 % 100 mL IVPB  Status:  Discontinued     1 g 200 mL/hr over 30 Minutes Intravenous Every 24 hours 09/17/17 1115 09/20/17 1111   09/17/17 1100  ceFEPIme (MAXIPIME) 2 g in sodium chloride 0.9 % 100 mL IVPB  Status:  Discontinued     2 g 200 mL/hr over 30 Minutes Intravenous  Once 09/17/17 1054 09/17/17 1115   09/17/17 1100  azithromycin (ZITHROMAX) 500 mg in sodium chloride 0.9 % 250 mL IVPB  Status:  Discontinued     500 mg 250 mL/hr over 60 Minutes Intravenous Every 24 hours 09/17/17 1054 09/17/17 1559      Subjective:   Eileen Stanford seen and examined today.  Feels her pain is better controlled with the IV morphine and is very appreciative. Complains of rib pain. Denies current chest pain. Feels breathing is about the same. Denies abdomina pain, N/V/D/C.   Objective:   Vitals:   09/21/17 2128 09/22/17 0626 09/22/17 0828 09/22/17 0830  BP: 110/69 (!) 143/89    Pulse: 78 73    Resp: 18 20    Temp: 98 F (36.7 C) 98.1 F (36.7 C)    TempSrc: Oral Oral    SpO2: 100% 100% 100% 100%  Weight:      Height:        Intake/Output Summary (Last 24 hours) at 09/22/2017 1333 Last data filed at 09/22/2017 0615 Gross per 24 hour  Intake -  Output 700 ml  Net -700 ml   Filed Weights   09/17/17 1221 09/17/17 1643  Weight: 30.3 kg 33.1 kg   Exam  General: Well developed, chronically ill  appearing, cachetic, NAD  HEENT: NCAT, mucous membranes moist.   Neck: Supple  Cardiovascular: S1 S2 auscultated, no murmur, RRR  Respiratory: Clear to auscultation bilaterally with equal chest rise  Abdomen: Soft, nontender, nondistended, + bowel sounds  Extremities: warm dry without cyanosis clubbing or edema  Neuro: AAOx3, NAD  Psych: Normal affect and demeanor   Data Reviewed: I have personally reviewed following labs and imaging studies  CBC: Recent Labs  Lab 09/15/17 1509  09/16/17 1750 09/17/17 1313 09/17/17 1614 09/18/17 0802 09/19/17 1106 09/20/17 0348 09/21/17 0842  WBC 12.0*  --  10.8* 8.8 7.5 5.1 8.0 8.0 8.3  NEUTROABS 9.6*  --  8.4* 6.6  --   --   --   --   --   HGB 12.7   < > 12.0 11.4* 8.6* 8.5* 7.8* 9.1* 8.7*  HCT 38.4   < > 36.3 35.3* 26.9* 26.3* 23.7* 28.5* 26.2*  MCV 87.5  --  87.9 87.8 87.9 87.4 85.6 86.9 84.8  PLT 499*  --  423* 426* 325 275 173 242 307   < > =  values in this interval not displayed.   Basic Metabolic Panel: Recent Labs  Lab 09/17/17 1313 09/17/17 1614 09/18/17 0802 09/19/17 1106 09/20/17 0348 09/21/17 0842  NA 142  --  143 140 139 138  K 3.8  --  4.0 4.7 4.6 4.5  CL 105  --  112* 115* 114* 109  CO2 27  --  24 18* 20* 22  GLUCOSE 90  --  86 128* 94 77  BUN 18  --  11 8 9 10   CREATININE 0.78 0.69 0.70 0.63 0.58 0.62  CALCIUM 9.5  --  8.3* 8.2* 8.2* 8.7*   GFR: Estimated Creatinine Clearance: 37.1 mL/min (by C-G formula based on SCr of 0.62 mg/dL). Liver Function Tests: Recent Labs  Lab 09/17/17 1313  AST 16  ALT 11  ALKPHOS 89  BILITOT 0.4  PROT 7.2  ALBUMIN 3.5   No results for input(s): LIPASE, AMYLASE in the last 168 hours. No results for input(s): AMMONIA in the last 168 hours. Coagulation Profile: No results for input(s): INR, PROTIME in the last 168 hours. Cardiac Enzymes: No results for input(s): CKTOTAL, CKMB, CKMBINDEX, TROPONINI in the last 168 hours. BNP (last 3 results) No results for  input(s): PROBNP in the last 8760 hours. HbA1C: No results for input(s): HGBA1C in the last 72 hours. CBG: No results for input(s): GLUCAP in the last 168 hours. Lipid Profile: No results for input(s): CHOL, HDL, LDLCALC, TRIG, CHOLHDL, LDLDIRECT in the last 72 hours. Thyroid Function Tests: No results for input(s): TSH, T4TOTAL, FREET4, T3FREE, THYROIDAB in the last 72 hours. Anemia Panel: No results for input(s): VITAMINB12, FOLATE, FERRITIN, TIBC, IRON, RETICCTPCT in the last 72 hours. Urine analysis:    Component Value Date/Time   COLORURINE AMBER (A) 09/17/2017 1912   APPEARANCEUR CLOUDY (A) 09/17/2017 1912   LABSPEC 1.019 09/17/2017 1912   PHURINE 5.0 09/17/2017 1912   GLUCOSEU NEGATIVE 09/17/2017 1912   HGBUR MODERATE (A) 09/17/2017 1912   BILIRUBINUR NEGATIVE 09/17/2017 1912   BILIRUBINUR negative 06/03/2017 1120   KETONESUR NEGATIVE 09/17/2017 1912   PROTEINUR NEGATIVE 09/17/2017 1912   UROBILINOGEN 0.2 06/03/2017 1120   UROBILINOGEN 0.2 03/15/2017 1004   NITRITE NEGATIVE 09/17/2017 1912   LEUKOCYTESUR SMALL (A) 09/17/2017 1912   Sepsis Labs: @LABRCNTIP (procalcitonin:4,lacticidven:4)  ) Recent Results (from the past 240 hour(s))  Blood culture (routine x 2)     Status: None   Collection Time: 09/16/17  5:20 PM  Result Value Ref Range Status   Specimen Description   Final    BLOOD RIGHT ANTECUBITAL Performed at [facility], [place] [address], [place], [place] [address]    Special Requests   Final    BOTTLES DRAWN AEROBIC AND ANAEROBIC Blood Culture results may not be optimal due to an inadequate volume of blood received in culture bottles Performed at [facility] [address] [place], [place] [address]    Culture   Final    NO GROWTH 5 DAYS Performed at [facility], [place] [address], [place], [place] [address]    Report Status 09/21/2017 FINAL  Final  Blood culture (routine x 2)     Status: None   Collection  Time: 09/16/17  6:15 PM  Result Value Ref Range Status   Specimen Description   Final    BLOOD RIGHT ARM Performed at [facility] [address], [place],  [address]    Special Requests   Final    BOTTLES DRAWN AEROBIC  AND ANAEROBIC Blood Culture adequate volume Performed at [facility] [address] [place], [place] [address]    Culture   Final    NO GROWTH 5 DAYS Performed at [facility], [place] [address] [place], [place] [address]    Report Status 09/21/2017 FINAL  Final  Urine Culture     Status: Abnormal   Collection Time: 09/17/17  7:12 PM  Result Value Ref Range Status   Specimen Description   Final    URINE, CLEAN CATCH Performed at [facility], [place] [address], [place], [place] [address]    Special Requests   Final    NONE Performed at [facility], [place] [address], [place],  [address]    Culture >=100,000 COLONIES/mL ENTEROCOCCUS FAECALIS (A)  Final   Report Status 09/20/2017 FINAL  Final   Organism ID, Bacteria ENTEROCOCCUS FAECALIS (A)  Final      Susceptibility   Enterococcus faecalis - MIC*    AMPICILLIN <=2 SENSITIVE Sensitive     LEVOFLOXACIN 1 SENSITIVE Sensitive     NITROFURANTOIN <=16 SENSITIVE Sensitive     VANCOMYCIN 1 SENSITIVE Sensitive     * >=100,000 COLONIES/mL ENTEROCOCCUS FAECALIS      Radiology Studies: No results found.   Scheduled Meds: . acetaminophen  1,000 mg Oral Q8H  . amoxicillin-clavulanate  1 tablet Oral BID WC  . azithromycin  500 mg Oral Daily  . cloNIDine  0.1 mg Oral QAC breakfast  . feeding supplement  1 Container Oral BID BM  . lidocaine  1 patch Transdermal Q24H  . magnesium citrate  1 Bottle Oral Once  . mouth rinse  15 mL Mouth Rinse BID  . methocarbamol  500 mg Oral TID  . mirabegron ER  25 mg Oral Daily  . mometasone-formoterol  2 puff Inhalation BID  . multivitamin  15 mL Oral Daily  . tamsulosin  0.4 mg Oral  Daily  . traMADol  50 mg Oral Q8H   Continuous Infusions:   LOS: 3 days   Time Spent in minutes   30 minutes  Angelica Wix D.O. on 09/22/2017 at 1:33 PM  Between 7am to 7pm - Please see pager noted on [email or web address]  After 7pm go to [email or web address]  And look for the night coverage person covering for me after hours  [facility] Office  [phone]

## 2017-09-22 NOTE — Progress Notes (Signed)
Met with pt and husband to discuss disposition plans. Known to CSW from previous encounters and discussed at length having experienced difficulty with pursuing SNF in the past due to pain medication regimen. Pt no longer on Suboxone and reports being willing to comply with regimen at DC in order to participate in rehab and potentially re-initiate suboxone regimen managed by pain clinic provider. Attending has coordinated with pain clinic provider who reports ability to follow pt once pt back in community. Husband reports that when pt has been to rehab in the past "It does wonders for her. She gets her appetite back, she gets her strength back." Pt agreed and expressed willingness to consider SNF admission and staying 30 days required by Medicaid if CSW able to secure placement. Pt prefers [facility] as she has had good experience there in the past but open to any facility. FL2 complete and referrals pending.  Sharren Bridge, MSW, LCSW Clinical Social Work 09/22/2017 [phone]

## 2017-09-22 NOTE — Progress Notes (Signed)
Physical Therapy Treatment Patient Details Name: [name] MRN: [id number] DOB: 01/20/1950 Today's Date: 09/22/2017    History of Present Illness 67 y.o. female with medical history significant for chronic alcoholic pancreatitis, renal cancer , gastric ulcer, COPD GOLD stage III, HTN, drug-seeking behavior, was brought to the ED 09/17/17 with recent diagnosis of pneumonia. T10 compression fracture. No planned treatment.    PT Comments    Pt assisted with ambulating short distance in hallway.  Pt remained on 4L O2 [place] however SPO2 dropped to 86% upon returning to room.  Pt and spouse report plan for d/c to SNF.   Follow Up Recommendations  SNF     Equipment Recommendations  None recommended by PT    Recommendations for Other Services       Precautions / Restrictions Precautions Precautions: Fall    Mobility  Bed Mobility Overal bed mobility: Needs Assistance Bed Mobility: Sidelying to Sit;Sit to Supine   Sidelying to sit: Supervision Supine to sit: Supervision Sit to supine: Supervision   General bed mobility comments: cued to perform log roll technique, no assist required  Transfers Overall transfer level: Needs assistance Equipment used: Rolling walker (2 wheeled) Transfers: Sit to/from Stand Sit to Stand: Min assist         General transfer comment: assist for rise and steady, cues to lines upon sitting (phone cord and O2 tubing)  Ambulation/Gait Ambulation/Gait assistance: Min guard;Min assist Gait Distance (Feet): 80 Feet Assistive device: Rolling walker (2 wheeled) Gait Pattern/deviations: Step-through pattern;Decreased stride length     General Gait Details: verbal cues for RW positioning, distance to tolerance, SPO2 86% on 4L O2 [place] upon return to room, improved to 93% upon return to supine and rest   Stairs             Wheelchair Mobility    Modified Rankin (Stroke Patients Only)       Balance                                             Cognition Arousal/Alertness: Awake/alert Behavior During Therapy: WFL for tasks assessed/performed Overall Cognitive Status: Within Functional Limits for tasks assessed                                        Exercises      General Comments        Pertinent Vitals/Pain Pain Assessment: 0-10 Pain Score: 3  Pain Location: back Pain Descriptors / Indicators: Discomfort Pain Intervention(s): Premedicated before session;Monitored during session;Repositioned    Home Living                      Prior Function            PT Goals (current goals can now be found in the care plan section) Progress towards PT goals: Progressing toward goals    Frequency    Min 3X/week      PT Plan Current plan remains appropriate    Co-evaluation              AM-PAC PT "6 Clicks" Daily Activity  Outcome Measure  Difficulty turning over in bed (including adjusting bedclothes, sheets and blankets)?: A Little Difficulty moving from lying on back to sitting on the side of  the bed? : A Little Difficulty sitting down on and standing up from a chair with arms (e.g., wheelchair, bedside commode, etc,.)?: Unable Help needed moving to and from a bed to chair (including a wheelchair)?: A Little Help needed walking in hospital room?: A Little Help needed climbing 3-5 steps with a railing? : A Lot 6 Click Score: 15    End of Session Equipment Utilized During Treatment: Oxygen Activity Tolerance: Patient limited by fatigue Patient left: in bed;with call bell/phone within reach;with bed alarm set   PT Visit Diagnosis: Unsteadiness on feet (R26.81)     Time: 1202-1218 PT Time Calculation (min) (ACUTE ONLY): 16 min  Charges:  $Gait Training: 8-22 mins                    Carmelia Bake, PT, DPT Acute Rehabilitation Services Office: [phone] Pager: [phone]  Trena Platt 09/22/2017, 12:57 PM

## 2017-09-23 MED ORDER — ONDANSETRON HCL 4 MG/2ML IJ SOLN
4.0000 mg | Freq: Four times a day (QID) | INTRAMUSCULAR | Status: DC | PRN
  Administered 2017-09-23 – 2017-09-24 (×2): 4 mg via INTRAVENOUS
  Filled 2017-09-23 (×2): qty 2

## 2017-09-23 NOTE — Progress Notes (Signed)
CSW continues to seek SNF placement- multiple declines from area facilities due to hx of substance use issues and Medicaid coverage. Updated pt and husband and expanded bed search outside of area.   Sharren Bridge, MSW, LCSW Clinical Social Work 09/23/2017 [phone]

## 2017-09-23 NOTE — Progress Notes (Signed)
Spouse concerned because Pt has a PET scan scheduled for 09/26/17. The diagnostic area recommended that spouse call once Pt has been placed in SNF to reschedule PET scan. Provided phone # to Pt & spouse.

## 2017-09-23 NOTE — Progress Notes (Signed)
PROGRESS NOTE    [name]  ZOX:[id number] DOB: 1950/05/12 DOA: 09/17/2017 PCP: Azzie Glatter, FNP   Brief Narrative:  Patient is a 67 year old female with chronic pancreatitis, substance abuse, COPD, history of renal CA, chronic pancreatitis presented to ED with shortness of breath and inability to take oral antibiotics.  Per patient she could not eat over the last few days and her nose for bothering her, she was not able to take oral antibiotics that were prescribed.  Normally she is on 4 L of O2 at home. Assessment & Plan   Community-acquired pneumonia -Patient was given antibiotics as an outpatient however was not able to swallow those pills -Chest x-ray Stable RLL infiltrate, stable LLL mass -CT chest below -Blood cultures 09/16/2017 showed no growth; blood cultures from 08/30/2017 showed no growth -Patient initially placed on Rocephin and azithromycin and transition to Augmentin -MRI thoracic spine incidentally showed worsening bilateral pleural effusions and bibasilar consolidation/edema, patient was given 1 dose of Lasix 20 mg IV.  She refused a BNP  Enterococcus UTI -Urine culture showed >100k Enterococcus faecalis which is pansensitive -She was on Rocephin however was transitioned to oral Augmentin  History of left renal cancer/ Left lower lobe pulmonary nodule -status post cryoablation and recurrence in 2012 -Review of hospitalist discussed with Dr. Earlie Server who recommended CT chest with outpatient PET/CT which has been ordered on 9/23 -CT chest showed left lower lobe mass 3.1 cm which could represent primary bronchogenic carcinoma or metastatic disease, patchy consolidation of bilateral lung bases most likely pneumonia and/or aspiration -CT results have been discussed with patient as well as family  Acute/subacute T7 fracture -New compression fracture deformity involving superior endplate of T7 vertebral body, acute to subacute, possibly pathological fracture.   Patient denied recent fall -PT recommended SNF -Continue pain control with scheduled tramadol Tylenol Robaxin and Lidoderm patch -Low dose IV morphine added (see discussion below) -MRI thoracic spine showed mild superior endplate deformity of T7, possibly chronic compression fracture likely osteopenic.  Per interventional radiology, no indication of kyphoplasty at this time -Social work consulted, pending placement (patient has been difficult to place given history)  Acute on chronic pain with polysubstance abuse history -Patient's husband very insistent on patient receiving oral pain medication.  States that she was on Suboxone as an outpatient.  Per [facility] database, patient did have Suboxone prescription filled on 08/29/2017. -Called and discussed with Dr. Angie Fava, pain management, who recommended not restarting her Suboxone at this time and following up with her in the clinic.  He states that patient has had a history of substance abuse.  He also recommended that patient see him in the clinic as soon as she is discharged.  Felt that if needed IV pain medication could be given for acute pain.  Would not recommend oral narcotics at this time.  Please also see Dr. Josem Kaufmann note from 09/19/2017, as she also discussed patient with Dr.Dakwa.  Also discussed that SNF are sometimes reluctant to take patients on suboxone; Dr. Angie Fava stated he would be glad to continue being the prescriber if patient follows up with him in the clinic.   Acute anxiety -Continue Ativan -Patient has Artie been made aware that she would not be receiving a prescription upon discharge and will need to follow-up with her primary physician or psychiatrist  Severe protein calorie malnutrition -Nutrition consulted, continue supplements  DVT Prophylaxis  Lovenox  Code Status: Full  Family Communication: Husband at bedside  Disposition Plan: Admitted. Pending SNF placement. Social  work consulted  Consultants Oncology, Dr. Earlie Server via  phone Interventional radiology Dr. Angie Fava, pain management, via phone  Procedures  None  Antibiotics   Anti-infectives (From admission, onward)   Start     Dose/Rate Route Frequency Ordered Stop   09/20/17 1200  amoxicillin-clavulanate (AUGMENTIN) 875-125 MG per tablet 1 tablet     1 tablet Oral 2 times daily with meals 09/20/17 1111     09/19/17 1000  azithromycin (ZITHROMAX) tablet 500 mg     500 mg Oral Daily 09/19/17 0816     09/18/17 1400  azithromycin (ZITHROMAX) 500 mg in sodium chloride 0.9 % 250 mL IVPB  Status:  Discontinued     500 mg 250 mL/hr over 60 Minutes Intravenous Every 24 hours 09/17/17 1547 09/19/17 0816   09/17/17 1130  cefTRIAXone (ROCEPHIN) 1 g in sodium chloride 0.9 % 100 mL IVPB  Status:  Discontinued     1 g 200 mL/hr over 30 Minutes Intravenous Every 24 hours 09/17/17 1115 09/20/17 1111   09/17/17 1100  ceFEPIme (MAXIPIME) 2 g in sodium chloride 0.9 % 100 mL IVPB  Status:  Discontinued     2 g 200 mL/hr over 30 Minutes Intravenous  Once 09/17/17 1054 09/17/17 1115   09/17/17 1100  azithromycin (ZITHROMAX) 500 mg in sodium chloride 0.9 % 250 mL IVPB  Status:  Discontinued     500 mg 250 mL/hr over 60 Minutes Intravenous Every 24 hours 09/17/17 1054 09/17/17 1559      Subjective:   [name] seen and examined today.  Continues to have pain in her back and other places, but feels the morphine helps slightly. Denies current chest pain, abdominal pain, N/V/D/C. Feels breathing is about the same.   Objective:   Vitals:   09/22/17 1924 09/22/17 2155 09/23/17 0518 09/23/17 0957  BP:  126/76 130/86   Pulse:  79 75   Resp:  16 16   Temp:  98.9 F (37.2 C) 98.7 F (37.1 C)   TempSrc:  Oral Oral   SpO2: 97% 100% 100% 100%  Weight:      Height:        Intake/Output Summary (Last 24 hours) at 09/23/2017 1320 Last data filed at 09/22/2017 2157 Gross per 24 hour  Intake -  Output 375 ml  Net -375 ml   Filed Weights   09/17/17 1221 09/17/17 1643    Weight: 30.3 kg 33.1 kg   Exam  General: Well developed, cachetic, chronically ill appearing, NAD  HEENT: NCAT, mucous membranes moist.   Neck: Supple  Cardiovascular: S1 S2 auscultated, no murmurs, RRR  Respiratory: Clear to auscultation bilaterally with equal chest rise  Abdomen: Soft, nontender, nondistended, + bowel sounds  Extremities: warm dry without cyanosis clubbing or edema  Neuro: AAOx3, nonfocal  Psych: Appropriate mood and affect  Data Reviewed: I have personally reviewed following labs and imaging studies  CBC: Recent Labs  Lab 09/16/17 1750 09/17/17 1313 09/17/17 1614 09/18/17 0802 09/19/17 1106 09/20/17 0348 09/21/17 0842  WBC 10.8* 8.8 7.5 5.1 8.0 8.0 8.3  NEUTROABS 8.4* 6.6  --   --   --   --   --   HGB 12.0 11.4* 8.6* 8.5* 7.8* 9.1* 8.7*  HCT 36.3 35.3* 26.9* 26.3* 23.7* 28.5* 26.2*  MCV 87.9 87.8 87.9 87.4 85.6 86.9 84.8  PLT 423* 426* 325 275 173 242 578   Basic Metabolic Panel: Recent Labs  Lab 09/17/17 1313 09/17/17 1614 09/18/17 0802 09/19/17 1106 09/20/17 0348 09/21/17 4696  NA 142  --  143 140 139 138  K 3.8  --  4.0 4.7 4.6 4.5  CL 105  --  112* 115* 114* 109  CO2 27  --  24 18* 20* 22  GLUCOSE 90  --  86 128* 94 77  BUN 18  --  11 8 9 10   CREATININE 0.78 0.69 0.70 0.63 0.58 0.62  CALCIUM 9.5  --  8.3* 8.2* 8.2* 8.7*   GFR: Estimated Creatinine Clearance: 37.1 mL/min (by C-G formula based on SCr of 0.62 mg/dL). Liver Function Tests: Recent Labs  Lab 09/17/17 1313  AST 16  ALT 11  ALKPHOS 89  BILITOT 0.4  PROT 7.2  ALBUMIN 3.5   No results for input(s): LIPASE, AMYLASE in the last 168 hours. No results for input(s): AMMONIA in the last 168 hours. Coagulation Profile: No results for input(s): INR, PROTIME in the last 168 hours. Cardiac Enzymes: No results for input(s): CKTOTAL, CKMB, CKMBINDEX, TROPONINI in the last 168 hours. BNP (last 3 results) No results for input(s): PROBNP in the last 8760  hours. HbA1C: No results for input(s): HGBA1C in the last 72 hours. CBG: No results for input(s): GLUCAP in the last 168 hours. Lipid Profile: No results for input(s): CHOL, HDL, LDLCALC, TRIG, CHOLHDL, LDLDIRECT in the last 72 hours. Thyroid Function Tests: No results for input(s): TSH, T4TOTAL, FREET4, T3FREE, THYROIDAB in the last 72 hours. Anemia Panel: No results for input(s): VITAMINB12, FOLATE, FERRITIN, TIBC, IRON, RETICCTPCT in the last 72 hours. Urine analysis:    Component Value Date/Time   COLORURINE AMBER (A) 09/17/2017 1912   APPEARANCEUR CLOUDY (A) 09/17/2017 1912   LABSPEC 1.019 09/17/2017 1912   PHURINE 5.0 09/17/2017 1912   GLUCOSEU NEGATIVE 09/17/2017 1912   HGBUR MODERATE (A) 09/17/2017 1912   BILIRUBINUR NEGATIVE 09/17/2017 1912   BILIRUBINUR negative 06/03/2017 1120   KETONESUR NEGATIVE 09/17/2017 1912   PROTEINUR NEGATIVE 09/17/2017 1912   UROBILINOGEN 0.2 06/03/2017 1120   UROBILINOGEN 0.2 03/15/2017 1004   NITRITE NEGATIVE 09/17/2017 1912   LEUKOCYTESUR SMALL (A) 09/17/2017 1912   Sepsis Labs: @LABRCNTIP (procalcitonin:4,lacticidven:4)  ) Recent Results (from the past 240 hour(s))  Blood culture (routine x 2)     Status: None   Collection Time: 09/16/17  5:20 PM  Result Value Ref Range Status   Specimen Description   Final    BLOOD RIGHT ANTECUBITAL Performed at [facility], [place] [address], [place], [place] [address]    Special Requests   Final    BOTTLES DRAWN AEROBIC AND ANAEROBIC Blood Culture results may not be optimal due to an inadequate volume of blood received in culture bottles Performed at [facility] [address], [place], [place] [address]    Culture   Final    NO GROWTH 5 DAYS Performed at [facility], [place] [address], [place], [place] [address]    Report Status 09/21/2017 FINAL  Final  Blood culture (routine x 2)     Status: None   Collection Time: 09/16/17  6:15 PM  Result Value  Ref Range Status   Specimen Description   Final    BLOOD RIGHT ARM Performed at [facility] [address] [place], [place] [address]    Special Requests   Final    BOTTLES DRAWN AEROBIC AND ANAEROBIC Blood Culture adequate volume Performed at [facility] [address], [place], [place] [address]    Culture   Final  NO GROWTH 5 DAYS Performed at [facility], [place] [address] [place], [place] [address]    Report Status 09/21/2017 FINAL  Final  Urine Culture     Status: Abnormal   Collection Time: 09/17/17  7:12 PM  Result Value Ref Range Status   Specimen Description   Final    URINE, CLEAN CATCH Performed at [facility], [place] [address], [place], [place] [address]    Special Requests   Final    NONE Performed at [facility], [place] [address] [place], [place] [address]    Culture >=100,000 COLONIES/mL ENTEROCOCCUS FAECALIS (A)  Final   Report Status 09/20/2017 FINAL  Final   Organism ID, Bacteria ENTEROCOCCUS FAECALIS (A)  Final      Susceptibility   Enterococcus faecalis - MIC*    AMPICILLIN <=2 SENSITIVE Sensitive     LEVOFLOXACIN 1 SENSITIVE Sensitive     NITROFURANTOIN <=16 SENSITIVE Sensitive     VANCOMYCIN 1 SENSITIVE Sensitive     * >=100,000 COLONIES/mL ENTEROCOCCUS FAECALIS      Radiology Studies: No results found.   Scheduled Meds: . amoxicillin-clavulanate  1 tablet Oral BID WC  . azithromycin  500 mg Oral Daily  . feeding supplement  1 Container Oral BID BM  . lidocaine  1 patch Transdermal Q24H  . magnesium citrate  1 Bottle Oral Once  . mouth rinse  15 mL Mouth Rinse BID  . methocarbamol  500 mg Oral TID  . mirabegron ER  25 mg Oral Daily  . mometasone-formoterol  2 puff Inhalation BID  . multivitamin  15 mL Oral Daily  . tamsulosin  0.4 mg Oral Daily  . traMADol  50 mg Oral Q8H   Continuous Infusions:   LOS: 4 days   Time Spent in minutes   30  minutes  Shaya Altamura D.O. on 09/23/2017 at 1:20 PM  Between 7am to 7pm - Please see pager noted on [email or web address]  After 7pm go to [email or web address]  And look for the night coverage person covering for me after hours  [facility] Office  [phone]

## 2017-09-24 MED ORDER — ADULT MULTIVITAMIN LIQUID CH: 15.0000 mL | Freq: Every day | ORAL | 0 refills | Status: DC

## 2017-09-24 MED ORDER — METHOCARBAMOL 500 MG PO TABS: 500.0000 mg | ORAL_TABLET | Freq: Three times a day (TID) | ORAL | 0 refills | Status: AC

## 2017-09-24 MED ORDER — LIDOCAINE 5 % EX PTCH: 1.0000 | MEDICATED_PATCH | CUTANEOUS | 0 refills | Status: DC

## 2017-09-24 MED ORDER — MIRABEGRON ER 25 MG PO TB24: 25.0000 mg | ORAL_TABLET | Freq: Every day | ORAL | 0 refills | Status: DC

## 2017-09-24 MED ORDER — TAMSULOSIN HCL 0.4 MG PO CAPS: 0.4000 mg | ORAL_CAPSULE | Freq: Every day | ORAL | 0 refills | Status: DC

## 2017-09-24 MED ORDER — AMOXICILLIN-POT CLAVULANATE 875-125 MG PO TABS: 1.0000 | ORAL_TABLET | Freq: Two times a day (BID) | ORAL | 0 refills | Status: AC

## 2017-09-24 NOTE — Care Management Note (Signed)
Case Management Note  Patient Details  Name: [name] MRN: [id number] Date of Birth: [birth year]  Subjective/Objective:   PNA, T10 compression fx                 Action/Plan: NCM spoke to pt's husband. Offered choice for [facility]. Husband states she had [facility] in the past. She has wheelchair, oxygen and neb machine at home. Contacted [facility] with new referral. Husband states her paperwork for home aide to be provided through [facility] Medicaid PCS was sent to [facility] in [place]. He is waiting for them to arrange her initial assessment with the Assessment Team to determine the number hours she qualifies for each day. Paperwork was completed by her PCP.   Expected Discharge Date:  09/24/17               Expected Discharge Plan:  Home/Self Care  In-House Referral:  NA  Discharge planning Services  CM Consult  Post Acute Care Choice:  NA Choice offered to:  Patient  DME Arranged:  N/A DME Agency:  NA  HH Arranged:  PT, OT HH Agency:  [facility]  Status of Service:  Completed, signed off  If discussed at [place] of Stay Meetings, dates discussed:    Additional Comments:  Erenest Rasher, RN 09/24/2017, 1:55 PM

## 2017-09-24 NOTE — Progress Notes (Addendum)
Physical Therapy Treatment Patient Details Name: [name] MRN: [id number] DOB: February 19, 1950 Today's Date: 09/24/2017    History of Present Illness 67 y.o. female with medical history significant for chronic alcoholic pancreatitis, renal cancer , gastric ulcer, COPD GOLD stage III, HTN, drug-seeking behavior, was brought to the ED 09/17/17 with recent diagnosis of pneumonia. T10 compression fracture. No planned treatment.    PT Comments    Pt feeling much better.  Amb > 500' RA avg 87-90% with HR 64   Good alternating gait.  No need for any AD.  WNL speed   No LOB Pt eager to D/C to home "I'm ready to go".    SATURATION QUALIFICATIONS: (This note is used to comply with regulatory documentation for home oxygen)  Patient Saturations on Room Air at Rest = 96%  Patient Saturations on Room Air while Ambulating 500 feet= 88% mild dyspnea  Patient Saturations on               Liters of oxygen while Ambulating =   Please briefly explain why patient needs home oxygen:  Pt does NOT require supplemental oxygen   Follow Up Recommendations  No PT follow up     Equipment Recommendations  None recommended by PT    Recommendations for Other Services       Precautions / Restrictions Precautions Precautions: Fall Restrictions Weight Bearing Restrictions: No    Mobility  Bed Mobility               General bed mobility comments: OOB   Transfers Overall transfer level: Independent                  Ambulation/Gait Ambulation/Gait assistance: Independent       Gait velocity: pt at nursing station on arrival.  Amb >500' RA avg 87-90% with HR 64   Good alternating gait.  No need for any AD.  WNL speed   No LOB       Stairs             Wheelchair Mobility    Modified Rankin (Stroke Patients Only)       Balance                                            Cognition Arousal/Alertness: Awake/alert Behavior During Therapy: WFL for  tasks assessed/performed Overall Cognitive Status: Within Functional Limits for tasks assessed                                 General Comments: "I want to go home", "No there's nothing wrong with my back"      Exercises      General Comments        Pertinent Vitals/Pain Pain Assessment: No/denies pain    Home Living                      Prior Function            PT Goals (current goals can now be found in the care plan section) Progress towards PT goals: Progressing toward goals    Frequency    Min 3X/week      PT Plan Other (comment)    Co-evaluation              AM-PAC  PT "6 Clicks" Daily Activity  Outcome Measure  Difficulty turning over in bed (including adjusting bedclothes, sheets and blankets)?: None Difficulty moving from lying on back to sitting on the side of the bed? : None Difficulty sitting down on and standing up from a chair with arms (e.g., wheelchair, bedside commode, etc,.)?: None Help needed moving to and from a bed to chair (including a wheelchair)?: None Help needed walking in hospital room?: None Help needed climbing 3-5 steps with a railing? : None 6 Click Score: 24    End of Session Equipment Utilized During Treatment: Gait belt Activity Tolerance: Patient tolerated treatment well Patient left: in chair;with call bell/phone within reach Nurse Communication: Mobility status PT Visit Diagnosis: Unsteadiness on feet (R26.81)     Time: 3300-7622 PT Time Calculation (min) (ACUTE ONLY): 15 min  Charges:  $Gait Training: 8-22 mins                     Rica Koyanagi  PTA Acute  Rehabilitation Services Pager      [phone] Office      [phone]

## 2017-09-24 NOTE — Discharge Summary (Signed)
Physician Discharge Summary  Patient ID: [name] MRN: [id number] DOB/AGE: 04-26-1950 67 y.o.  Admit date: 09/17/2017 Discharge date: 09/24/2017  Admission Diagnoses:  Discharge Diagnoses:  Active Problems:   PNA (pneumonia)   Pneumonia   Discharged Condition: stable  Hospital Course: Patient is a 67 year old female with chronic pancreatitis, substance abuse, COPD, history of renal CA, and and chronic pancreatitis.  Patient presented to ED with shortness of breath and inability to take oral antibiotics. Per patient she could not eat nor take oral antibiotics that were prescribed.   Community-acquired pneumonia -Patient was given antibiotics as an outpatient however was not able to swallow those pills according to the patient. -Chest x-ray Stable RLL infiltrate, stable LLL mass -CT chest is as documented below. -Blood cultures 09/16/2017 showed no growth; blood cultures from 08/30/2017 showed no growth -Patient was initially placed on Rocephin and azithromycin and transitioned to Augmentin -MRI thoracic spine incidentally showed worsening bilateral pleural effusions and bibasilar consolidation/edema.  Enterococcus UTI -Urine culture showed >100k Enterococcus faecalis which is pansensitive -Patient was on Rocephin, and later transitioned to oral Augmentin  History of left renal cancer/ Left lower lobe pulmonary nodule -status post cryoablation and recurrence in 2012 -Review of hospitalist discussed with Dr. Earlie Server who recommended CT chest with outpatient PET/CT which has been ordered on 9/23 -CT chest showed left lower lobe mass 3.1 cm which could represent primary bronchogenic carcinoma or metastatic disease, patchy consolidation of bilateral lung bases most likely pneumonia and/or aspiration -CT results have been discussed with patient as well as family  Acute/subacute T7 fracture -New compression fracture deformity involving superior endplate of T7 vertebral body,  acute to subacute, possibly pathological fracture.  Patient denied recent fall -PT recommended SNF -Continue pain control with scheduled tramadol Tylenol Robaxin and Lidoderm patch -Low dose IV morphine added (see below) -MRI thoracic spine showed mild superior endplate deformity of T7, possibly chronic compression fracture likely osteopenic.  Per interventional radiology, no indication of kyphoplasty at this time -Social work consulted.  Initial plan was for skilled nursing facility placement.  However, during the hospital stay, the patient was reported to have improved significantly.  Physical therapy will reassess patient prior to discharge, and advice possibility of discharging patient home with PT/OT.    Acute on chronic pain with polysubstance abuse history -Patient's husband very insistent on patient receiving oral pain medication.  States that she was on Suboxone as an outpatient.  Per [facility] database, patient did have Suboxone prescription filled on 08/29/2017. -Dr. Angie Fava, pain management, was contacted and it was recommended that Suboxone should not be restarted at this time.  She will need to follow with the pain clinic.  Dr. Angie Fava stated that patient has had a history of substance abuse.  Dr. Angie Fava also recommended that patient should see him at the clinic as soon as she is discharged.     Severe protein calorie malnutrition -Nutrition consulted, continue supplements (boost)  Lab work and imaging studies: CT scan of the chest with contrast revealed: 1. LEFT lower lobe mass measures 3.1 cm, almost certainly neoplastic. This could represent primary bronchogenic carcinoma or metastatic disease. Recommend tissue sampling. 2. Patchy consolidations at the bilateral lung bases, most likely pneumonia and/or aspiration. 3. Small bilateral pleural effusions. 4. New compression fracture deformity involving the superior endplate of the T7 vertebral body, approximately 30% compressed anteriorly  and centrally, acute to subacute in age, suspected pathologic fracture. No associated vertebral body retropulsion or displacement. 5. Diffuse coronary artery calcifications.  Aortic Atherosclerosis and Emphysema   Consults: None   Discharge Exam: Blood pressure 124/79, pulse 73, temperature 98.3 F (36.8 C), temperature source Oral, resp. rate 17, height 5\' 4"  (1.626 m), weight 33.1 kg, SpO2 100 %.   Disposition: Discharge disposition: 06-Home-Health Care Svc   Discharge Instructions    Diet - low sodium heart healthy   Complete by:  As directed    Increase activity slowly   Complete by:  As directed      Allergies as of 09/24/2017      Reactions   Aspirin Other (See Comments)   Break out in welts   Chlorpromazine Hcl Other (See Comments)   Muscle spasms      Medication List    STOP taking these medications   azithromycin 250 MG tablet Commonly known as:  ZITHROMAX   busPIRone 10 MG tablet Commonly known as:  BUSPAR   cephALEXin 500 MG capsule Commonly known as:  KEFLEX   hydrOXYzine 25 MG tablet Commonly known as:  ATARAX/VISTARIL   ondansetron 4 MG tablet Commonly known as:  ZOFRAN     TAKE these medications   amoxicillin-clavulanate 875-125 MG tablet Commonly known as:  AUGMENTIN Take 1 tablet by mouth 2 (two) times daily with a meal for 7 days.   famotidine 20 MG tablet Commonly known as:  PEPCID Take 1 tablet (20 mg total) by mouth daily. What changed:    when to take this  reasons to take this   lidocaine 5 % Commonly known as:  LIDODERM Place 1 patch onto the skin daily. Remove & Discard patch within 12 hours or as directed by MD Start taking on:  09/25/2017   methocarbamol 500 MG tablet Commonly known as:  ROBAXIN Take 1 tablet (500 mg total) by mouth 3 (three) times daily for 7 days.   mirabegron ER 25 MG Tb24 tablet Commonly known as:  MYRBETRIQ Take 1 tablet (25 mg total) by mouth daily. Start taking on:  09/25/2017    mometasone-formoterol 200-5 MCG/ACT Aero Commonly known as:  DULERA Inhale 2 puffs into the lungs 2 (two) times daily.   multivitamin Liqd Take 15 mLs by mouth daily. Start taking on:  09/25/2017   polyethylene glycol powder powder Commonly known as:  GLYCOLAX/MIRALAX Mix 1 capful of powder into drink and take by mouth 1-3 times daily until daily soft stools OTC   PROAIR HFA 108 (90 Base) MCG/ACT inhaler Generic drug:  albuterol Inhale 1 puff into the lungs every 4 (four) hours as needed for wheezing or shortness of breath.   SPIRIVA HANDIHALER 18 MCG inhalation capsule Generic drug:  tiotropium PLACE 1 CAPSULE INTO INHALER AND INHALE DAILY What changed:  See the new instructions.   tamsulosin 0.4 MG Caps capsule Commonly known as:  FLOMAX Take 1 capsule (0.4 mg total) by mouth daily. Start taking on:  09/25/2017      Time Spent - 32 Minutes  Signed: Bonnell Public 09/24/2017, 12:59 PM

## 2017-09-24 NOTE — Progress Notes (Addendum)
Patient discharged to home, all discharge medications and instructions reviewed and questions answered.  Patient to be assisted to vehicle by wheelchair.  Patient to discharge in hospital gown and yellow socks as spouse will not bring anything to the unit for patient to change.

## 2017-09-26 ENCOUNTER — Ambulatory Visit (HOSPITAL_COMMUNITY): Payer: Medicaid Other

## 2017-09-28 ENCOUNTER — Encounter (HOSPITAL_COMMUNITY)
Admission: RE | Admit: 2017-09-28 | Discharge: 2017-09-28 | Disposition: A | Discharge status: Home / Self Care | Payer: Medicaid Other | Source: Ambulatory Visit | Attending: Internal Medicine | Admitting: Internal Medicine

## 2017-09-28 ENCOUNTER — Encounter (HOSPITAL_COMMUNITY): Payer: Self-pay

## 2017-09-28 DIAGNOSIS — R911 Solitary pulmonary nodule: Secondary | ICD-10-CM | POA: Diagnosis present

## 2017-09-28 LAB — GLUCOSE, CAPILLARY: Glucose-Capillary: 82 mg/dL (ref 70–99)

## 2017-09-28 NOTE — Consult Note (Signed)
IV TEAM: Arrived to radiology, informed that patient declined PIV access by IV RN. She had multiple attempts and decided to reschedule.

## 2017-09-29 ENCOUNTER — Other Ambulatory Visit: Payer: Self-pay | Admitting: Internal Medicine

## 2017-09-29 DIAGNOSIS — R911 Solitary pulmonary nodule: Secondary | ICD-10-CM

## 2017-10-01 ENCOUNTER — Other Ambulatory Visit: Payer: Self-pay

## 2017-10-01 ENCOUNTER — Emergency Department (HOSPITAL_COMMUNITY)
Admission: EM | Admit: 2017-10-01 | Discharge: 2017-10-01 | Disposition: A | Discharge status: Home / Self Care | Payer: Medicaid Other | Attending: Emergency Medicine | Admitting: Emergency Medicine

## 2017-10-01 ENCOUNTER — Encounter (HOSPITAL_COMMUNITY): Payer: Self-pay

## 2017-10-01 ENCOUNTER — Emergency Department (HOSPITAL_COMMUNITY): Payer: Medicaid Other

## 2017-10-01 DIAGNOSIS — I10 Essential (primary) hypertension: Secondary | ICD-10-CM | POA: Insufficient documentation

## 2017-10-01 DIAGNOSIS — Z79899 Other long term (current) drug therapy: Secondary | ICD-10-CM | POA: Diagnosis not present

## 2017-10-01 DIAGNOSIS — E46 Unspecified protein-calorie malnutrition: Secondary | ICD-10-CM | POA: Diagnosis not present

## 2017-10-01 DIAGNOSIS — J449 Chronic obstructive pulmonary disease, unspecified: Secondary | ICD-10-CM

## 2017-10-01 DIAGNOSIS — F410 Panic disorder [episodic paroxysmal anxiety] without agoraphobia: Secondary | ICD-10-CM | POA: Diagnosis not present

## 2017-10-01 DIAGNOSIS — Z85528 Personal history of other malignant neoplasm of kidney: Secondary | ICD-10-CM | POA: Insufficient documentation

## 2017-10-01 DIAGNOSIS — F419 Anxiety disorder, unspecified: Secondary | ICD-10-CM | POA: Insufficient documentation

## 2017-10-01 DIAGNOSIS — F17210 Nicotine dependence, cigarettes, uncomplicated: Secondary | ICD-10-CM | POA: Diagnosis not present

## 2017-10-01 DIAGNOSIS — R0602 Shortness of breath: Secondary | ICD-10-CM

## 2017-10-01 DIAGNOSIS — R69 Illness, unspecified: Secondary | ICD-10-CM

## 2017-10-01 LAB — COMPREHENSIVE METABOLIC PANEL
ALK PHOS: 69 U/L (ref 38–126)
ALT: 11 U/L (ref 0–44)
AST: 16 U/L (ref 15–41)
Albumin: 3.3 g/dL — ABNORMAL LOW (ref 3.5–5.0)
Anion gap: 9 (ref 5–15)
BILIRUBIN TOTAL: 0.4 mg/dL (ref 0.3–1.2)
BUN: 7 mg/dL — ABNORMAL LOW (ref 8–23)
CALCIUM: 9 mg/dL (ref 8.9–10.3)
CO2: 26 mmol/L (ref 22–32)
Chloride: 101 mmol/L (ref 98–111)
Creatinine, Ser: 0.77 mg/dL (ref 0.44–1.00)
GFR calc Af Amer: >60 mL/min (ref >60)
GLUCOSE: 92 mg/dL (ref 70–99)
Potassium: 4.1 mmol/L (ref 3.5–5.1)
Sodium: 136 mmol/L (ref 135–145)
TOTAL PROTEIN: 6.3 g/dL — AB (ref 6.5–8.1)

## 2017-10-01 LAB — I-STAT CG4 LACTIC ACID, ED: Lactic Acid, Venous: 0.92 mmol/L (ref 0.5–1.9)

## 2017-10-01 LAB — CBC WITH DIFFERENTIAL/PLATELET
ABS IMMATURE GRANULOCYTES: 0 10*3/uL (ref 0.0–0.1)
BASOS ABS: 0.1 10*3/uL (ref 0.0–0.1)
BASOS PCT: 1 %
EOS PCT: 1 %
Eosinophils Absolute: 0.1 10*3/uL (ref 0.0–0.7)
HCT: 28 % — ABNORMAL LOW (ref 36.0–46.0)
HEMOGLOBIN: 8.8 g/dL — AB (ref 12.0–15.0)
Immature Granulocytes: 0 %
Lymphocytes Relative: 10 %
Lymphs Abs: 1.2 10*3/uL (ref 0.7–4.0)
MCH: 28.5 pg (ref 26.0–34.0)
MCHC: 31.4 g/dL (ref 30.0–36.0)
MCV: 90.6 fL (ref 78.0–100.0)
MONO ABS: 0.9 10*3/uL (ref 0.1–1.0)
Monocytes Relative: 7 %
NEUTROS ABS: 9.7 10*3/uL — AB (ref 1.7–7.7)
Neutrophils Relative %: 81 %
PLATELETS: 254 10*3/uL (ref 150–400)
RBC: 3.09 MIL/uL — AB (ref 3.87–5.11)
RDW: 15.8 % — ABNORMAL HIGH (ref 11.5–15.5)
WBC: 12 10*3/uL — AB (ref 4.0–10.5)

## 2017-10-01 LAB — I-STAT CHEM 8, ED
BUN: 8 mg/dL (ref 8–23)
CALCIUM ION: 1.2 mmol/L (ref 1.15–1.40)
CREATININE: 0.7 mg/dL (ref 0.44–1.00)
Chloride: 101 mmol/L (ref 98–111)
GLUCOSE: 85 mg/dL (ref 70–99)
HCT: 28 % — ABNORMAL LOW (ref 36.0–46.0)
HEMOGLOBIN: 9.5 g/dL — AB (ref 12.0–15.0)
Potassium: 4.1 mmol/L (ref 3.5–5.1)
Sodium: 137 mmol/L (ref 135–145)
TCO2: 26 mmol/L (ref 22–32)

## 2017-10-01 LAB — PROTIME-INR
INR: 0.94
PROTHROMBIN TIME: 12.5 s (ref 11.4–15.2)

## 2017-10-01 LAB — I-STAT TROPONIN, ED: Troponin i, poc: 0 ng/mL (ref 0.00–0.08)

## 2017-10-01 LAB — PHOSPHORUS: Phosphorus: 3.5 mg/dL (ref 2.5–4.6)

## 2017-10-01 LAB — LIPASE, BLOOD: Lipase: 19 U/L (ref 11–51)

## 2017-10-01 LAB — MAGNESIUM: MAGNESIUM: 1.8 mg/dL (ref 1.7–2.4)

## 2017-10-01 MED ORDER — IPRATROPIUM-ALBUTEROL 0.5-2.5 (3) MG/3ML IN SOLN
3.0000 mL | Freq: Once | RESPIRATORY_TRACT | Status: AC
  Administered 2017-10-01: 3 mL via RESPIRATORY_TRACT
  Filled 2017-10-01: qty 3

## 2017-10-01 MED ORDER — ALPRAZOLAM 0.25 MG PO TABS: 0.2500 mg | ORAL_TABLET | Freq: Three times a day (TID) | ORAL | 0 refills | Status: DC | PRN

## 2017-10-01 MED ORDER — AMOXICILLIN-POT CLAVULANATE 875-125 MG PO TABS: 1.0000 | ORAL_TABLET | Freq: Two times a day (BID) | ORAL | 0 refills | Status: DC

## 2017-10-01 MED ORDER — ALPRAZOLAM 0.25 MG PO TABS
0.5000 mg | ORAL_TABLET | Freq: Once | ORAL | Status: AC
  Administered 2017-10-01: 0.5 mg via ORAL
  Filled 2017-10-01: qty 2

## 2017-10-01 MED ORDER — SODIUM CHLORIDE 0.9 % IV SOLN
Freq: Once | INTRAVENOUS | Status: AC
  Administered 2017-10-01: 16:00:00 via INTRAVENOUS

## 2017-10-01 NOTE — ED Triage Notes (Signed)
Pt arrives to ED from home with complaints of anxiety/panic attack and generalized pain since this morning. EMS reports pt initially called out with shortness of breath and hyperventillating. Pt was recently told she has a "spot" on her lung and is scheduled for a PET scan. Pt placed in position of comfort with bed locked and lowered, call bell in reach.

## 2017-10-01 NOTE — Discharge Instructions (Signed)
Go to your PET scan as scheduled. You may use Xanax as needed for the next 2 days to help with anxiety as you approach her PET scan.  You will need to talk to your family doctor and pain management about ongoing control of these symptoms. Take Augmentin for an additional 7 days. Return to the emergency department if you develop a fever, blood with coughing, vomiting or other concerning symptoms.

## 2017-10-01 NOTE — ED Notes (Signed)
Patient verbalizes understanding of discharge instructions. Opportunity for questioning and answers were provided. Armband removed by staff, pt discharged from ED ambulatory with husband.

## 2017-10-01 NOTE — ED Provider Notes (Signed)
[facility] EMERGENCY DEPARTMENT Provider Note   CSN: [id number] Arrival date & time: 10/01/17  1330     History   Chief Complaint Chief Complaint  Patient presents with  . Anxiety  . Generalized Body Aches    HPI [name] is a 67 y.o. female.  HPI Patient reports that she feels really bad all over.  She states she feels like her chest and throat are closing off.  She has increasing shortness of breath.  She reports pain around both sides of her ribs.  She also reports aching pain in her legs.  She has not had a fever.  She reports that she is had a lot of weight loss recently and she is trying to eat and drink but is not going well.  She reports that she has been off of her anxiety medicine for several months.  She reports that parts of this feel like anxiety and panic but she reports she is never felt quite this bad in a general sort of way before. Past Medical History:  Diagnosis Date  . Cancer ([place])    renal ca  . COPD (chronic obstructive pulmonary disease) ([place])   . Drug-seeking behavior   . Pancreatitis   . Pancreatitis   . Seizures ([place])   . Substance abuse [facility])     Patient Active Problem List   Diagnosis Date Noted  . Pneumonia 09/19/2017  . PNA (pneumonia) 09/17/2017  . Osteoarthritis of facet joint of lumbar spine 07/26/2017  . Low back pain radiating to both legs 07/26/2017  . 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 Failure to thrive in adult 06/21/2017  . Pressure injury of skin 04/06/2017  . Pressure injury of skin of right ischial tuberosity region: Stage 2 04/06/2017  . Acute urinary retention 04/04/2017  . Benzodiazepine dependence ([place])   . AKI (acute kidney injury) ([place]) 04/03/2017  . Hip fracture, right ([place]) 06/02/2016  . Narcotic addiction ([place]) 02/29/2016  . Abdominal pain, chronic, epigastric- due to chronic pancreatitis 02/29/2016  . COPD (chronic obstructive pulmonary disease) ([place])  02/28/2016  . Cough 09/03/2015  . Abdominal pain 01/03/2015  . Hyponatremia 01/03/2015  . Hypocalcemia 01/03/2015  . Underweight 01/03/2015  . COPD GOLD III with reversible component  07/17/2014  . DTs (delirium tremens) ([place]) 06/02/2013  . Chronic alcoholic pancreatitis ([place]) 06/02/2013  . Lactic acidosis 06/02/2013  . Recurrent acute pancreatitis 03/28/2013  . Alcohol withdrawal ([place]) 03/28/2013  . Pancreatitis 03/09/2013  . Tobacco abuse 03/09/2013  . Alcohol withdrawal syndrome with perceptual disturbance ([place]) 03/09/2013  . Acute alcoholic pancreatitis 35/36/1443  . Benzodiazepine withdrawal ([place]) 02/09/2013  . Protein-calorie malnutrition, severe ([place]) 02/09/2013  . Hypokalemia 02/27/2011  . Abdominal pain, acute 02/25/2011  . Nausea and vomiting 02/25/2011  . Thrombocytopenia ([place]) 02/25/2011  . COPD with acute exacerbation ([place]) 02/25/2011  . HTN (hypertension), malignant 02/25/2011  . Malignant neoplasm of kidney excluding renal pelvis ([place]) 10/18/2008  . Anxiety state 10/18/2008  . GERD 10/18/2008  . PANCREATITIS, CHRONIC- atrophic pancreas 10/18/2008  . DYSPNEA 10/18/2008  . GASTRIC ULCER, HX OF 10/18/2008    Past Surgical History:  Procedure Laterality Date  . IR GENERIC HISTORICAL  05/08/2015   IR RADIOLOGIST EVAL & MGMT 05/08/2015 Aletta Edouard, MD [facility] INTERV RAD  . IR GENERIC HISTORICAL  01/08/2014   IR RADIOLOGIST EVAL & MGMT 01/08/2014 Aletta Edouard, MD [facility] INTERV RAD  . KIDNEY SURGERY     removed cancerous lesions  .  PARTIAL GASTRECTOMY       OB History   None      Home Medications    Prior to Admission medications   Medication Sig Start Date End Date Taking? Authorizing Provider  amoxicillin-clavulanate (AUGMENTIN) 875-125 MG tablet Take 1 tablet by mouth 2 (two) times daily with a meal for 7 days. 09/24/17 10/01/17 Yes Bonnell Public, MD  famotidine (PEPCID) 20 MG tablet Take 1 tablet (20 mg total) by mouth daily. Patient taking  differently: Take 20 mg by mouth daily as needed for heartburn or indigestion.  08/28/17  Yes Khatri, Hina, PA-C  mometasone-formoterol (DULERA) 200-5 MCG/ACT AERO Inhale 2 puffs into the lungs 2 (two) times daily. Patient taking differently: Inhale 3 puffs into the lungs daily.  08/08/17  Yes Azzie Glatter, FNP  ondansetron (ZOFRAN) 4 MG tablet Take 4 mg by mouth daily as needed for nausea/vomiting. 09/26/17  Yes [provider]  PROAIR HFA 108 (90 Base) MCG/ACT inhaler Inhale 1 puff into the lungs every 4 (four) hours as needed for wheezing or shortness of breath.  08/02/17  Yes [provider]  SPIRIVA HANDIHALER 18 MCG inhalation capsule PLACE 1 CAPSULE INTO INHALER AND INHALE DAILY Patient taking differently: Place 18 mcg into inhaler and inhale daily as needed (shortness of breath).  09/08/17  Yes Azzie Glatter, FNP  SUBOXONE 8-2 MG FILM Take 1 Film by mouth every 12 (twelve) hours. 09/30/17  Yes [provider]  tamsulosin (FLOMAX) 0.4 MG CAPS capsule Take 1 capsule (0.4 mg total) by mouth daily. 09/25/17  Yes Bonnell Public, MD  ALPRAZolam Duanne Moron) 0.25 MG tablet Take 1 tablet (0.25 mg total) by mouth 3 (three) times daily as needed for anxiety. 10/01/17   Charlesetta Shanks, MD  amoxicillin-clavulanate (AUGMENTIN) 875-125 MG tablet Take 1 tablet by mouth 2 (two) times daily. One po bid x 7 days 10/01/17   Charlesetta Shanks, MD  lidocaine (LIDODERM) 5 % Place 1 patch onto the skin daily. Remove & Discard patch within 12 hours or as directed by MD Patient not taking: Reported on 10/01/2017 09/25/17   Dana Allan I, MD  methocarbamol (ROBAXIN) 500 MG tablet Take 1 tablet (500 mg total) by mouth 3 (three) times daily for 7 days. Patient not taking: Reported on 10/01/2017 09/24/17 10/01/17  Dana Allan I, MD  mirabegron ER (MYRBETRIQ) 25 MG TB24 tablet Take 1 tablet (25 mg total) by mouth daily. Patient not taking: Reported on 10/01/2017 09/25/17 10/25/17  Dana Allan I, MD  Multiple Vitamin (MULTIVITAMIN) LIQD Take 15 mLs by mouth daily. Patient not taking: Reported on 10/01/2017 09/25/17 10/25/17  Dana Allan I, MD  polyethylene glycol powder (GLYCOLAX/MIRALAX) powder Mix 1 capful of powder into drink and take by mouth 1-3 times daily until daily soft stools OTC Patient not taking: Reported on 10/01/2017 08/30/17   Little, Wenda Overland, MD    Family History Family History  Problem Relation Age of Onset  . CAD Mother   . Alcoholism Father   . COPD Father     Social History Social History   Tobacco Use  . Smoking status: Current Every Day Smoker    Packs/day: 1.50    Years: 30.00    Pack years: 45.00    Types: Cigarettes  . Smokeless tobacco: Never Used  . Tobacco comment: smoking up to 1.5 ppd per husband. down from 3pk day to 1.5   Substance Use Topics  . Alcohol use: Not Currently    Alcohol/week:  0.0 standard drinks    Comment: "I stopped drinking 1 year ago"  . Drug use: No    Comment: Hx of polysubstance drug abuse     Allergies   Aspirin and Chlorpromazine hcl   Review of Systems Review of Systems 10 Systems reviewed and are negative for acute change except as noted in the HPI.   Physical Exam Updated Vital Signs BP 136/89 (BP Location: Right Arm)   Pulse (!) 118   Temp 99.4 F (37.4 C) (Oral)   Resp (!) 22   Ht 5\' 4"  (1.626 m)   Wt 30.8 kg   SpO2 98%   BMI 11.67 kg/m   Physical Exam  Constitutional: She is oriented to person, place, and time.  Patient is alert.  She has mild to moderate increased work of breathing.  Speech is clear.  Patient has extreme cachexia.  Appears very deconditioned.  HENT:  Head: Normocephalic and atraumatic.  Mucous membranes are slightly dry.  Posterior oropharynx is patent.  Eyes: EOM are normal.  Neck: Neck supple.  Cardiovascular:  Borderline tachycardia.  No gross rub murmur gallop.  Pulmonary/Chest:  Mild to moderate increased work of breathing.  Patient has  very soft breath sounds bilaterally.  Scattered fine expiratory wheeze.  Breath sounds on the right seem more diminished throughout.  Abdominal:  Abdomen is scaphoid.  Patient endorses generalized discomfort to palpation.  Musculoskeletal:  Extremities are extremely cachectic.  No peripheral edema.  Neurological: She is alert and oriented to person, place, and time.  No focal deficits.  Patient's mental status is alert and appropriate.  Is moving all extremities at will.  Skin:  Skin is warm and dry with thinning and dryness and multiple ecchymoses.  Psychiatric:  Patient is a anxious but appropriate.     ED Treatments / Results  Labs (all labs ordered are listed, but only abnormal results are displayed) Labs Reviewed  COMPREHENSIVE METABOLIC PANEL - Abnormal; Notable for the following components:      Result Value   BUN 7 (*)    Total Protein 6.3 (*)    Albumin 3.3 (*)    All other components within normal limits  CBC WITH DIFFERENTIAL/PLATELET - Abnormal; Notable for the following components:   WBC 12.0 (*)    RBC 3.09 (*)    Hemoglobin 8.8 (*)    HCT 28.0 (*)    RDW 15.8 (*)    Neutro Abs 9.7 (*)    All other components within normal limits  I-STAT CHEM 8, ED - Abnormal; Notable for the following components:   Hemoglobin 9.5 (*)    HCT 28.0 (*)    All other components within normal limits  URINE CULTURE  CULTURE, BLOOD (ROUTINE X 2)  CULTURE, BLOOD (ROUTINE X 2)  MAGNESIUM  PHOSPHORUS  PROTIME-INR  LIPASE, BLOOD  URINALYSIS, ROUTINE W REFLEX MICROSCOPIC  BLOOD GAS, ARTERIAL  I-STAT CG4 LACTIC ACID, ED  I-STAT TROPONIN, ED  I-STAT CG4 LACTIC ACID, ED    EKG EKG Interpretation  Date/Time:  Saturday October 01 2017 15:33:02 EDT Ventricular Rate:  107 PR Interval:    QRS Duration: 72 QT Interval:  327 QTC Calculation: 437 R Axis:   83 Text Interpretation:  Sinus tachycardia Right atrial enlargement Borderline right axis deviation tachycardia, no change  from previous Confirmed by Charlesetta Shanks [phone]) on 10/01/2017 4:53:05 PM   Radiology Dg Chest Port 1 View  Result Date: 10/01/2017 CLINICAL DATA:  Shortness of breath EXAM: PORTABLE CHEST 1 VIEW  COMPARISON:  CT 09/18/2017, radiograph 09/17/2017 FINDINGS: Patchy right lower lobe infiltrate. Partially obscured medial left lung base mass. Hyperinflation with emphysematous disease. Stable cardiomediastinal silhouette with aortic atherosclerosis. No pneumothorax. IMPRESSION: 1. No significant interval change in patchy right lung base infiltrate. 2. Stable medial left lung base pulmonary mass. Electronically Signed   By: Donavan Foil M.D.   On: 10/01/2017 15:58    Procedures Procedures (including critical care time)  Medications Ordered in ED Medications  ALPRAZolam (XANAX) tablet 0.5 mg (has no administration in time range)  0.9 %  sodium chloride infusion ( Intravenous New Bag/Given 10/01/17 1547)  ipratropium-albuterol (DUONEB) 0.5-2.5 (3) MG/3ML nebulizer solution 3 mL (3 mLs Nebulization Given 10/01/17 1547)     Initial Impression / Assessment and Plan / ED Course  I have reviewed the triage vital signs and the nursing notes.  Pertinent labs & imaging results that were available during my care of the patient were reviewed by me and considered in my medical decision making (see chart for details).     Patient has many comorbid medical conditions.  She has recently been hospitalized with diagnosis of pneumonia and history of renal cancer, chronic pancreatitis and CT scan showing a 3 cm mass that by radiology interpretation had high suspicion for neoplasm either primary or metastatic.  Patient is due for PET scan in 2 days.  She comes in tearful and extremely anxious.  She reports she feels generally very bad.  She however vacillates from episodes of being extremely tearful and hyperventilating to being calm and cooperative.  There appears to be a large overlie of anxiety with panic attack.   Her underlying medical conditions appear grossly stable.  Chest x-ray is unchanged from previous.  When patient is calm and interactive she shows no signs of respiratory distress at rest.  She has not developed any peripheral edema or lower extremity swelling.  Lab work is stable there is been no interim dehydration or renal dysfunction or other electrolyte imbalance.  At this time, I do not see indication for inpatient management.  Patient's husband is frustrated with the amount of anxiety and tearfulness that the patient is experiencing.  She previously had been many years on Ativan and then was transitioned to BuSpar.  She has history of substance abuse/dependency.  At this time however, I do not see an advantage in having the patient go through panic attacks leading up to this PET scan for definitive diagnosis of potential malignancy.  Prescribe Xanax for 3 days until she can get her scans done and further address the findings with her providers and ongoing management.  Patient had aspiration pneumonia.  Clinically she is not showing acute signs of pneumonia, review of the prior CT did show this to be bilateral and patchy, will extend Augmentin for another week until completion of evaluation and definitive identification of malignancy versus possible pneumonia with underlying COPD. Final Clinical Impressions(s) / ED Diagnoses   Final diagnoses:  Shortness of breath  Chronic obstructive pulmonary disease, unspecified COPD type ([place])  Severe comorbid illness  Panic attack  Malnutrition, unspecified type [facility])    ED Discharge Orders         Ordered    amoxicillin-clavulanate (AUGMENTIN) 875-125 MG tablet  2 times daily     10/01/17 1647    ALPRAZolam (XANAX) 0.25 MG tablet  3 times daily PRN     10/01/17 1649           Charlesetta Shanks, MD 10/01/17 1702

## 2017-10-03 ENCOUNTER — Ambulatory Visit (HOSPITAL_COMMUNITY)
Admission: RE | Admit: 2017-10-03 | Discharge: 2017-10-03 | Disposition: A | Discharge status: Home / Self Care | Payer: Medicaid Other | Source: Ambulatory Visit | Attending: Internal Medicine | Admitting: Internal Medicine

## 2017-10-03 ENCOUNTER — Other Ambulatory Visit: Payer: Self-pay | Admitting: Internal Medicine

## 2017-10-03 ENCOUNTER — Encounter (HOSPITAL_COMMUNITY): Payer: Self-pay | Admitting: Interventional Radiology

## 2017-10-03 DIAGNOSIS — I2510 Atherosclerotic heart disease of native coronary artery without angina pectoris: Secondary | ICD-10-CM | POA: Diagnosis not present

## 2017-10-03 DIAGNOSIS — Z9889 Other specified postprocedural states: Secondary | ICD-10-CM | POA: Insufficient documentation

## 2017-10-03 DIAGNOSIS — J850 Gangrene and necrosis of lung: Secondary | ICD-10-CM | POA: Insufficient documentation

## 2017-10-03 DIAGNOSIS — R911 Solitary pulmonary nodule: Secondary | ICD-10-CM | POA: Insufficient documentation

## 2017-10-03 DIAGNOSIS — R918 Other nonspecific abnormal finding of lung field: Secondary | ICD-10-CM | POA: Diagnosis not present

## 2017-10-03 DIAGNOSIS — I700 Atherosclerosis of aorta: Secondary | ICD-10-CM | POA: Diagnosis not present

## 2017-10-03 HISTORY — PX: IR RADIOLOGY PERIPHERAL GUIDED IV START: IMG5598

## 2017-10-03 LAB — GLUCOSE, CAPILLARY: GLUCOSE-CAPILLARY: 73 mg/dL (ref 70–99)

## 2017-10-03 MED ORDER — LIDOCAINE HCL 1 % IJ SOLN
INTRAMUSCULAR | Status: AC
  Filled 2017-10-03: qty 20

## 2017-10-03 MED ORDER — FLUDEOXYGLUCOSE F - 18 (FDG) INJECTION
4.3600 | Freq: Once | INTRAVENOUS | Status: AC | PRN
  Administered 2017-10-03: 4.36 via INTRAVENOUS

## 2017-10-03 NOTE — Assessment & Plan Note (Deleted)
This is a very pleasant 67 years old malnourished white female with history of renal cell carcinoma diagnosed in 2009 status post radiofrequency ablation as well as cryoablation last treatment was in 2012.  She is presenting with left lower lobe lung mass suspicious for metastatic disease from renal cell carcinoma versus primary lung malignancy.   PLAN: I personally and independently reviewed the scan images and discussed the result and showed the images to the patient and her husband today.  I recommended for the patient to complete the staging work-up by ordering a PET scan for further evaluation of her disease and to rule out any metastatic lesions. I will arrange for the patient to come back for follow-up visit after the PET scan for discussion of the results and also to decide on the best lesion for biopsy for confirmation of her tissue diagnosis. For pain management and drug seeking behavior, the patient will continue her routine follow-up visit and evaluation by the pain clinic and her primary care physician.  She understands that I will not be able to prescribe any pain medication for her. For the malnutrition, I strongly encouraged the patient to increase her oral intake and I would be happy to refer her to a dietitian at the cancer center for evaluation if needed. I also strongly encouraged the patient to quit smoking. She was advised to call if she has any concerning symptoms in the interval.   The patient voices understanding of current disease status and treatment options and is in agreement with the current care plan.

## 2017-10-03 NOTE — Progress Notes (Signed)
[facility] OFFICE PROGRESS NOTE  Janice Glatter, FNP [place] [address]  DIAGNOSIS: history of renal cell carcinoma diagnosed in 2009 status post radiofrequency ablation as well as cryoablation last treatment was in 2012.  She is presenting with left lower lobe lung mass suspicious for metastatic disease from renal cell carcinoma versus primary lung malignancy.  PRIOR THERAPY: Radiofrequency ablation as well as cryoablation.  Last treatment was in 2012.  CURRENT THERAPY: Observation.  INTERVAL HISTORY: [name] 67 y.o. female returns for a routine follow-up visit accompanied by her husband.  The patient reports that she is been having fatigue.  She was seen in the emergency room this past weekend for panic attacks.  She was given a prescription for Xanax to help with this.  She reports that the Xanax is helping her a lot.  She would like more of this.  She denies fevers and chills.  Denies chest pain and hemoptysis.  She reports shortness of breath with exertion and has a cough.  She remains on Augmentin for recent pneumonia.  Reports nausea and vomiting which is been ongoing for many months.  She reports that her appetite is better and she is gained back some of her lost weight.  The patient has chronic pain and has been followed by the pain clinic.  She has been given Suboxone but states that she stopped taking this about 1 week ago.  She had a recent PET scan is here to discuss the results.  MEDICAL HISTORY: Past Medical History:  Diagnosis Date  . Cancer ([place])    renal ca  . COPD (chronic obstructive pulmonary disease) ([place])   . Drug-seeking behavior   . Pancreatitis   . Pancreatitis   . Seizures ([place])   . Substance abuse ([place])     ALLERGIES:  is allergic to aspirin and chlorpromazine hcl.  MEDICATIONS:  Current Outpatient Medications  Medication Sig Dispense Refill  . ALPRAZolam (XANAX) 0.25 MG tablet Take 1 tablet (0.25 mg total) by  mouth 3 (three) times daily as needed for anxiety. 12 tablet 0  . amoxicillin-clavulanate (AUGMENTIN) 875-125 MG tablet Take 1 tablet by mouth 2 (two) times daily. One po bid x 7 days 14 tablet 0  . famotidine (PEPCID) 20 MG tablet Take 1 tablet (20 mg total) by mouth daily. (Patient taking differently: Take 20 mg by mouth daily as needed for heartburn or indigestion. ) 30 tablet 0  . lidocaine (LIDODERM) 5 % Place 1 patch onto the skin daily. Remove & Discard patch within 12 hours or as directed by MD (Patient not taking: Reported on 10/01/2017) 30 patch 0  . mirabegron ER (MYRBETRIQ) 25 MG TB24 tablet Take 1 tablet (25 mg total) by mouth daily. (Patient not taking: Reported on 10/01/2017) 30 tablet 0  . mometasone-formoterol (DULERA) 200-5 MCG/ACT AERO Inhale 2 puffs into the lungs 2 (two) times daily. (Patient taking differently: Inhale 3 puffs into the lungs daily. ) 0.1 g 0  . Multiple Vitamin (MULTIVITAMIN) LIQD Take 15 mLs by mouth daily. (Patient not taking: Reported on 10/01/2017) 450 mL 0  . ondansetron (ZOFRAN) 4 MG tablet Take 4 mg by mouth daily as needed for nausea/vomiting.  6  . polyethylene glycol powder (GLYCOLAX/MIRALAX) powder Mix 1 capful of powder into drink and take by mouth 1-3 times daily until daily soft stools OTC (Patient not taking: Reported on 10/01/2017) 225 g 0  . PROAIR HFA 108 (90 Base) MCG/ACT inhaler Inhale  1 puff into the lungs every 4 (four) hours as needed for wheezing or shortness of breath.   5  . SPIRIVA HANDIHALER 18 MCG inhalation capsule PLACE 1 CAPSULE INTO INHALER AND INHALE DAILY (Patient taking differently: Place 18 mcg into inhaler and inhale daily as needed (shortness of breath). ) 30 capsule 0  . SUBOXONE 8-2 MG FILM Take 1 Film by mouth every 12 (twelve) hours.  0  . tamsulosin (FLOMAX) 0.4 MG CAPS capsule Take 1 capsule (0.4 mg total) by mouth daily. 30 capsule 0   No current facility-administered medications for this visit.     SURGICAL HISTORY:   Past Surgical History:  Procedure Laterality Date  . IR GENERIC HISTORICAL  05/08/2015   IR RADIOLOGIST EVAL & MGMT 05/08/2015 Aletta Edouard, MD [facility] INTERV RAD  . IR GENERIC HISTORICAL  01/08/2014   IR RADIOLOGIST EVAL & MGMT 01/08/2014 Aletta Edouard, MD [facility] INTERV RAD  . IR RADIOLOGY PERIPHERAL GUIDED IV START  10/03/2017  . KIDNEY SURGERY     removed cancerous lesions  . PARTIAL GASTRECTOMY      REVIEW OF SYSTEMS:   Review of Systems  Constitutional: Negative for appetite change, chills, fever and unexpected weight change.  Positive for fatigue. HENT:   Negative for mouth sores, nosebleeds, sore throat and trouble swallowing.   Eyes: Negative for eye problems and icterus.  Respiratory: Negative for hemoptysis and wheezing.  Positive for cough and shortness of breath with exertion. Cardiovascular: Negative for chest pain and leg swelling.  Gastrointestinal: Negative for abdominal pain, constipation, diarrhea.  Positive for nausea vomiting which is been ongoing for the past 3 to 4 months.  She uses Zofran. Genitourinary: Negative for bladder incontinence, difficulty urinating, dysuria, frequency and hematuria.   Musculoskeletal: Positive for chronic back pain. Skin: Negative for itching and rash.  Neurological: Negative for dizziness, extremity weakness, headaches, light-headedness and seizures.  Hematological: Negative for adenopathy. Does not bruise/bleed easily.  Psychiatric/Behavioral: Negative for confusion, depression and sleep disturbance.  Positive for anxiety and panic attacks.   PHYSICAL EXAMINATION:  Blood pressure (!) 158/98, pulse 95, temperature 98.3 F (36.8 C), temperature source Oral, resp. rate 16, height 5\' 4"  (1.626 m), weight 72 lb 4.8 oz (32.8 kg), SpO2 92 %.  ECOG PERFORMANCE STATUS: 1 - Symptomatic but completely ambulatory  Physical Exam  Constitutional: Oriented to person, place, and time. No distress.  HENT:  Head: Normocephalic and atraumatic.   Mouth/Throat: Oropharynx is clear and moist. No oropharyngeal exudate.  Eyes: Conjunctivae are normal. Right eye exhibits no discharge. Left eye exhibits no discharge. No scleral icterus.  Neck: Normal range of motion. Neck supple.  Cardiovascular: Normal rate, regular rhythm, normal heart sounds and intact distal pulses.   Pulmonary/Chest: Effort normal and breath sounds normal. No respiratory distress. No wheezes. No rales.  Abdominal: Soft. Bowel sounds are normal. Exhibits no distension and no mass. There is no tenderness.  Musculoskeletal: Normal range of motion. Exhibits no edema.  Lymphadenopathy:    No cervical adenopathy.  Neurological: Alert and oriented to person, place, and time. Exhibits normal muscle tone. Coordination normal.  Skin: Skin is warm and dry. No rash noted. Not diaphoretic. No erythema. No pallor.  Psychiatric: Mood, memory and judgment normal.  Vitals reviewed.  LABORATORY DATA: Lab Results  Component Value Date   WBC 12.0 (H) 10/01/2017   HGB 9.5 (L) 10/01/2017   HCT 28.0 (L) 10/01/2017   MCV 90.6 10/01/2017   PLT 254 10/01/2017  Chemistry      Component Value Date/Time   NA 137 10/01/2017 1553   K 4.1 10/01/2017 1553   CL 101 10/01/2017 1553   CO2 26 10/01/2017 1542   BUN 8 10/01/2017 1553   CREATININE 0.70 10/01/2017 1553   CREATININE 0.89 09/13/2017 1333      Component Value Date/Time   CALCIUM 9.0 10/01/2017 1542   ALKPHOS 69 10/01/2017 1542   AST 16 10/01/2017 1542   AST 10 (L) 09/13/2017 1333   ALT 11 10/01/2017 1542   ALT 6 09/13/2017 1333   BILITOT 0.4 10/01/2017 1542   BILITOT 0.5 09/13/2017 1333       RADIOGRAPHIC STUDIES:  Dg Chest 2 View  Result Date: 09/17/2017 CLINICAL DATA:  Shortness of breath EXAM: CHEST - 2 VIEW COMPARISON:  09/15/2017 FINDINGS: Cardiac shadow is stable. A retrocardiac mass lesion is again identified similar to that seen on the prior exam on the left. Patchy infiltrate is again noted in the right  lung base stable from the previous exam. No new focal abnormality is noted. No bony abnormality is seen. IMPRESSION: Stable right lower lobe infiltrate. Stable left lower lobe mass. Electronically Signed   By: Inez Catalina M.D.   On: 09/17/2017 14:22   Dg Chest 2 View  Result Date: 09/15/2017 CLINICAL DATA:  Pt reports being unable to urinate since last night. Pt hx of COPD and normally on 4L. Pt is SOB and O2 sat is 86%. Smoker. EXAM: CHEST - 2 VIEW COMPARISON:  09/04/2017 FINDINGS: Lungs are markedly hyperinflated. The heart size is normal. In the LEFT LOWER lobe, oval mass is estimated to measure 4.1 x 3.0 centimeters, larger compared to prior studies. There is patchy opacity in the RIGHT lung base, consistent with infectious abnormality. No pulmonary edema. IMPRESSION: Increasing size of LEFT LOWER lobe mass. RIGHT LOWER lobe infiltrate. Electronically Signed   By: Nolon Nations M.D.   On: 09/15/2017 16:30   Dg Chest 2 View  Result Date: 09/04/2017 CLINICAL DATA:  Patient with productive cough EXAM: CHEST - 2 VIEW COMPARISON:  Chest radiograph 09/03/2017 FINDINGS: Stable cardiac and mediastinal contours. Re-demonstrated 3.4 cm mass projecting over the posterior left lower lung. Pulmonary hyperinflation. Emphysematous change. No large area pulmonary consolidation. No pleural effusion or pneumothorax. Thoracic spine degenerative changes. IMPRESSION: There is a 3.4 cm mass projecting over the medial left lower lung. This needs dedicated evaluation with contrast-enhanced chest CT. Considerations include primary pulmonary malignancy or metastatic disease. Electronically Signed   By: Lovey Newcomer M.D.   On: 09/04/2017 18:23   Ct Chest W Contrast  Result Date: 09/18/2017 CLINICAL DATA:  LEFT lower lobe pulmonary mass. Rib pain. History of LEFT renal clear cell carcinoma in 2009, status post cryoablation of cancer recurrence in 2012. EXAM: CT CHEST WITH CONTRAST TECHNIQUE: Multidetector CT imaging of the  chest was performed during intravenous contrast administration. CONTRAST:  50mL OMNIPAQUE IOHEXOL 300 MG/ML  SOLN COMPARISON:  CT chest dated 01/15/2017. Chest x-ray dated 09/17/2017. FINDINGS: Cardiovascular: No thoracic aortic aneurysm or evidence of aortic dissection. Aortic atherosclerosis. Heart size is normal. Diffuse coronary artery calcifications. Mediastinum/Nodes: Scattered small lymph nodes within the mediastinum and perihilar regions. No mass or enlarged lymph nodes seen. Esophagus is unremarkable. Small amount of debris within the trachea and central bronchi. Lungs/Pleura: Mass within the medial aspects of the LEFT lower lobe measures 3.1 x 2.8 cm, corresponding to findings on recent chest x-rays, new compared to the earlier chest CT of 01/15/2017, highly suggestive  of neoplastic mass. Patchy consolidations within the lower lobes bilaterally, dependent aspects, new compared to the earlier chest CT, stable or increasing compared to recent chest x-rays. Small bilateral pleural effusions. Mild emphysematous changes at the lung apices. Upper Abdomen: No acute findings on limited images of the upper abdomen. Musculoskeletal: New compression fracture deformity involving the superior endplate of the T7 vertebral body, likely acute to subacute, suspected pathologic fracture. No other acute or suspicious osseous finding. IMPRESSION: 1. LEFT lower lobe mass measures 3.1 cm, almost certainly neoplastic. This could represent primary bronchogenic carcinoma or metastatic disease. Recommend tissue sampling. 2. Patchy consolidations at the bilateral lung bases, most likely pneumonia and/or aspiration. 3. Small bilateral pleural effusions. 4. New compression fracture deformity involving the superior endplate of the T7 vertebral body, approximately 30% compressed anteriorly and centrally, acute to subacute in age, suspected pathologic fracture. No associated vertebral body retropulsion or displacement. 5. Diffuse  coronary artery calcifications. Aortic Atherosclerosis (ICD10-I70.0) and Emphysema (ICD10-J43.9). Electronically Signed   By: Franki Cabot M.D.   On: 09/18/2017 12:34   Mr Thoracic Spine Wo Contrast  Result Date: 09/19/2017 CLINICAL DATA:  Evaluate thoracic compression deformity. Patient has a chest mass. EXAM: MRI THORACIC SPINE WITHOUT CONTRAST TECHNIQUE: Multiplanar, multisequence MR imaging of the thoracic spine was performed. No intravenous contrast was administered. COMPARISON:  Chest CT scans 09/18/2017 and 01/15/2017. FINDINGS: Study was ordered without contrast. Sensitivity in the detection of metastatic disease is reduced. Alignment: Physiologic. Vertebrae: There is a mild superior endplate deformity of T7. There is no clear destructive process. There does not appear to be significant bone marrow edema. There is no significant retropulsion. While this deformity is new compared with prior chest CT from 01/15/2017, it does not clearly represent metastatic disease. This abnormality is favored to represent a late subacute to chronic compression fracture, related to osteopenia. Cord:  Normal signal and morphology.  No intraspinal mass lesion. Paraspinal and other soft tissues: The LEFT paravertebral mass is silhouetted by large BILATERAL pleural effusions, and bibasilar edema or consolidation, both of which are increased since yesterday's CT. Disc levels: There is no significant disc protrusion or spinal stenosis. No intraspinal mass lesion is evident on this noncontrast scan. IMPRESSION: Mild superior endplate deformity of T7, without significant bone marrow edema, retropulsion, or marrow replacement. This abnormality is favored to represent a late subacute to chronic compression fracture, likely osteopenic in nature. Worsening BILATERAL pleural effusions and bibasilar consolidation/edema. Electronically Signed   By: Staci Righter M.D.   On: 09/19/2017 16:29   Nm Pet Image Initial (pi) Skull Base To  Thigh  Result Date: 10/03/2017 CLINICAL DATA:  Initial treatment strategy for right lung mass. EXAM: NUCLEAR MEDICINE PET SKULL BASE TO THIGH TECHNIQUE: 4.4 mCi F-18 FDG was injected intravenously. Full-ring PET imaging was performed from the skull base to thigh after the radiotracer. CT data was obtained and used for attenuation correction and anatomic localization. Fasting blood glucose: 73 mg/dl COMPARISON:  Multiple exams, including 09/18/2017 FINDINGS: Mediastinal blood pool activity: SUV max 0.5 NECK: High muscular activity in the pterygoid muscles and diffusely throughout the tongue without CT correlate, likely physiologic. Potential symmetric accentuated activity in the tonsils. There is misregistration between the CT and PET data in the head/neck region. Incidental CT findings: Bilateral carotid atherosclerotic calcification. Severe paucity of adipose tissue. CHEST: 3.0 by 2.8 cm left lower lobe pleural-based mass posteromedially has maximum SUV of 8.0 and central low activity compatible with central necrosis. There is additional faint nodularity in both  lower lobes with low-grade associated metabolic activity. For example, a 0.9 by 0.4 cm nodule in the right lower lobe on image 37/8 has a maximum SUV of 0.8 and a subpleural band of airspace opacity in the right lower lobe measuring 2.3 by 1.2 cm has a maximum SUV of 1.4. There is additional scattered indistinctly marginated lower lobe nodularity with small irregular nodules of varying size favoring the lower lobes. There is also some atelectasis or bandlike density below the level of the left lower lobe mass. Mild biapical pleuroparenchymal scarring. Incidental CT findings: Lower lobe consolidation is generally improved from 09/18/2017, and aside from the left lower lobe mass, much of the nodularity and remaining opacity is probably inflammatory based on morphology and appearance. Coronary, aortic arch, and branch vessel atherosclerotic vascular disease.  Emphysema noted. ABDOMEN/PELVIS: Left kidney lower pole ablation defect with peripheral rim calcification but no current hypermetabolic activity. Incidental CT findings: Aortoiliac atherosclerotic vascular disease. SKELETON: No focal hypermetabolic activity to suggest skeletal metastasis. Incidental CT findings: Bilateral hip screws. IMPRESSION: 1. 3.0 cm left lower lobe mass is hypermetabolic with maximum SUV of 8.0, compatible with malignancy. This mass has central necrosis. 2. Improved by lateral airspace opacities with bibasilar nodularity and bandlike densities. These have relatively low-level metabolic activity, maximum SUV up to 1.4, and are most likely inflammatory given the improving response., thought to be physiologic. High activity in the tongue and some neck musculature 3. Other imaging findings of potential clinical significance: Aortic Atherosclerosis (ICD10-I70.0) and Emphysema (ICD10-J43.9). Coronary atherosclerosis. Stable appearance of left kidney lower pole ablation defect with peripheral rim calcification but no current hypermetabolic activity. Electronically Signed   By: Van Clines M.D.   On: 10/03/2017 13:15   Dg Chest Port 1 View  Result Date: 10/01/2017 CLINICAL DATA:  Shortness of breath EXAM: PORTABLE CHEST 1 VIEW COMPARISON:  CT 09/18/2017, radiograph 09/17/2017 FINDINGS: Patchy right lower lobe infiltrate. Partially obscured medial left lung base mass. Hyperinflation with emphysematous disease. Stable cardiomediastinal silhouette with aortic atherosclerosis. No pneumothorax. IMPRESSION: 1. No significant interval change in patchy right lung base infiltrate. 2. Stable medial left lung base pulmonary mass. Electronically Signed   By: Donavan Foil M.D.   On: 10/01/2017 15:58   Ir Radiology Peripheral Guided Iv Start  Result Date: 10/03/2017 CLINICAL DATA:  Difficult peripheral IV access. Lung nodule. Needs access for PET-CT. EXAM: IR RADIOLOGY PERIPHERAL GUIDED IV START  ANESTHESIA/SEDATION: None MEDICATIONS: Lidocaine 1% subcutaneous PROCEDURE: The procedure, risks (including but not limited to bleeding, infection, organ damage ), benefits, and alternatives were explained to the patient. Questions regarding the procedure were encouraged and answered. The patient understands and consents to the procedure. Patency of the right brachial vein confirmed by ultrasound and documented. Overlying skin prepped with chlorhexidine, draped in usual sterile fashion. Maximal barrier sterile technique was utilized including caps, mask, sterile gowns, sterile gloves, sterile drape, hand hygiene and skin antiseptic. Under real-time ultrasound guidance, the vein was accessed with a 21-gauge micropuncture needle, exchanged over a 018 guidewire for a 3 French vascular dilator. Dilator flushed and aspirated easily. Dilator was capped, flushed, and secured. COMPLICATIONS: None immediate FINDINGS: Patent right brachial vein.  Peripheral IV placed. IMPRESSION: 1. Technically successful right brachial vein peripheral IV placement under ultrasound. Okay for routine use. Electronically Signed   By: Lucrezia Europe M.D.   On: 10/03/2017 09:13     ASSESSMENT/PLAN:  Lung mass This is a very pleasant 67 year old malnourished white female with history of renal cell carcinoma  diagnosed in 2009 status post radiofrequency ablation as well as cryoablation last treatment was in 2012.  She is presenting with left lower lobe lung mass suspicious for metastatic disease from renal cell carcinoma versus primary lung malignancy. She had a recent PET scan is here to discuss the results.  The patient was seen by Dr. Julien Nordmann.  PET scan results were discussed with the patient and her husband which showed hypermetabolic activity in the left lower lobe lung mass.  No other areas were noted on the PET scan that were concerning for malignancy.  We discussed with the patient and her husband that we need to proceed with obtaining  a biopsy.  If this is lung cancer, it would be considered stage I.  We recommend evaluation by cardiothoracic surgery to see if she is a surgical candidate as she is not a surgical candidate, recommend treatment with radiation.  Chemotherapy would not be indicated for a stage I lung cancer.  We also discussed that this could be metastatic renal cell carcinoma.  If the biopsy shows this, we discussed that she would still be considered for surgery or radiation.  If it is renal cell carcinoma, we may need to do additional therapy. I have placed referrals to interventional radiology for a biopsy and also referral to cardiothoracic surgery and radiation oncology.  The patient will follow-up with [place] as needed pending the results of the biopsy.  For pain management and drug seeking behavior, the patient will continue her routine follow-up visit and evaluation by the pain clinic and her primary care physician.  She understands that I will not be able to prescribe any pain medication for her.  For her anxiety, recommend she follow-up with her primary care provider.  For the malnutrition, I strongly encouraged the patient to increase her oral intake and I would be happy to refer her to a dietitian at the cancer center for evaluation if needed.  I also strongly encouraged the patient to quit smoking.  She was advised to call if she has any concerning symptoms in the interval.   The patient voices understanding of current disease status and treatment options and is in agreement with the current care plan.    Orders Placed This Encounter  Procedures  . CT BIOPSY    Standing Status:   Future    Standing Expiration Date:   01/04/2019    Order Specific Question:   Reason for exam:    Answer:   Ct guided core biopsy of left lung mass.    Order Specific Question:   Preferred imaging location?    Answer:   [facility]  . Ambulatory referral to Cardiothoracic Surgery    Referral Priority:    Routine    Referral Type:   Surgical    Referral Reason:   Specialty Services Required    Referred to Provider:   Grace Isaac, MD    Requested Specialty:   Cardiothoracic Surgery    Number of Visits Requested:   1  . Ambulatory referral to Radiation Oncology    Referral Priority:   Routine    Referral Type:   Consultation    Referral Reason:   Specialty Services Required    Requested Specialty:   Radiation Oncology    Number of Visits Requested:   1     Mikey Bussing, DNP, AGPCNP-BC, AOCNP 10/04/17   ADDENDUM: Hematology/Oncology Attending: I had a face-to-face encounter with the patient today.  I recommended her care plan.  This is a 67 years old white female with history of renal cell carcinoma status post radiofrequency ablation as well as cryotherapy.  Under the care of Dr. Kathlene Cote.  The patient also has history of drug-seeking behavior.  She was found on recent imaging studies to have suspicious mass in the left lung.  I saw the patient for initial consultation recently and they ordered a PET scan which was performed on 10/03/2017. I personally and independently reviewed the scans and showed the results to the patient and her husband.  Her scan showed hypermetabolic activity in the left lower lobe lung mass but no other concerning findings.  She has a small nodule in the right lower lobe with no significant hypermetabolic activity suspicious for inflammatory process. I recommended for the patient to have CT-guided core biopsy of the left lower lobe lung mass for confirmation of tissue diagnosis. I will refer the patient to cardiothoracic surgery as well as radiation oncology for evaluation and consideration of the next best treatment options for the solitary left lower lobe lung mass. For pain management she will continue her care by the pain clinic as well as her primary care physician.  I explained to the patient that I would not be able to provide her with any pain pain  medication and she need to continue with her current provider for pain management. I will see the patient on as-needed basis or if she has any disease recurrence in the future. She was advised to call if she has any concerning symptoms.  Disclaimer: This note was dictated with voice recognition software. Similar sounding words can inadvertently be transcribed and may be missed upon review. Eilleen Kempf, MD 10/04/17

## 2017-10-03 NOTE — Procedures (Signed)
  Procedure: RUE IV start with [place] ok to use EBL:   minimal Complications:  none immediate  See full dictation in [facility].  Dillard Cannon MD Main # [phone] Pager  [phone]

## 2017-10-04 ENCOUNTER — Other Ambulatory Visit: Payer: Self-pay | Admitting: Oncology

## 2017-10-04 ENCOUNTER — Inpatient Hospital Stay: Payer: Medicaid Other | Attending: Oncology | Admitting: Oncology

## 2017-10-04 ENCOUNTER — Encounter: Payer: Self-pay | Admitting: Oncology

## 2017-10-04 VITALS — BP 158/98 | HR 95 | Temp 98.3°F | Resp 16 | Ht 64.0 in | Wt 72.3 lb

## 2017-10-04 DIAGNOSIS — Z79899 Other long term (current) drug therapy: Secondary | ICD-10-CM | POA: Diagnosis not present

## 2017-10-04 DIAGNOSIS — J449 Chronic obstructive pulmonary disease, unspecified: Secondary | ICD-10-CM | POA: Insufficient documentation

## 2017-10-04 DIAGNOSIS — Z85528 Personal history of other malignant neoplasm of kidney: Secondary | ICD-10-CM | POA: Insufficient documentation

## 2017-10-04 DIAGNOSIS — R918 Other nonspecific abnormal finding of lung field: Secondary | ICD-10-CM

## 2017-10-04 DIAGNOSIS — M549 Dorsalgia, unspecified: Secondary | ICD-10-CM | POA: Diagnosis not present

## 2017-10-04 DIAGNOSIS — I2510 Atherosclerotic heart disease of native coronary artery without angina pectoris: Secondary | ICD-10-CM | POA: Diagnosis not present

## 2017-10-04 DIAGNOSIS — I700 Atherosclerosis of aorta: Secondary | ICD-10-CM | POA: Insufficient documentation

## 2017-10-04 DIAGNOSIS — C642 Malignant neoplasm of left kidney, except renal pelvis: Secondary | ICD-10-CM

## 2017-10-04 DIAGNOSIS — G8929 Other chronic pain: Secondary | ICD-10-CM | POA: Insufficient documentation

## 2017-10-04 DIAGNOSIS — F419 Anxiety disorder, unspecified: Secondary | ICD-10-CM | POA: Diagnosis not present

## 2017-10-04 DIAGNOSIS — F410 Panic disorder [episodic paroxysmal anxiety] without agoraphobia: Secondary | ICD-10-CM | POA: Insufficient documentation

## 2017-10-04 NOTE — Assessment & Plan Note (Signed)
This is a very pleasant 67 year old malnourished white female with history of renal cell carcinoma diagnosed in 2009 status post radiofrequency ablation as well as cryoablation last treatment was in 2012.  She is presenting with left lower lobe lung mass suspicious for metastatic disease from renal cell carcinoma versus primary lung malignancy. She had a recent PET scan is here to discuss the results.  The patient was seen by Dr. Julien Nordmann.  PET scan results were discussed with the patient and her husband which showed hypermetabolic activity in the left lower lobe lung mass.  No other areas were noted on the PET scan that were concerning for malignancy.  We discussed with the patient and her husband that we need to proceed with obtaining a biopsy.  If this is lung cancer, it would be considered stage I.  We recommend evaluation by cardiothoracic surgery to see if she is a surgical candidate as she is not a surgical candidate, recommend treatment with radiation.  Chemotherapy would not be indicated for a stage I lung cancer.  We also discussed that this could be metastatic renal cell carcinoma.  If the biopsy shows this, we discussed that she would still be considered for surgery or radiation.  If it is renal cell carcinoma, we may need to do additional therapy. I have placed referrals to interventional radiology for a biopsy and also referral to cardiothoracic surgery and radiation oncology.  The patient will follow-up with [place] as needed pending the results of the biopsy.  For pain management and drug seeking behavior, the patient will continue her routine follow-up visit and evaluation by the pain clinic and her primary care physician.  She understands that I will not be able to prescribe any pain medication for her.  For her anxiety, recommend she follow-up with her primary care provider.  For the malnutrition, I strongly encouraged the patient to increase her oral intake and I would be happy to refer her  to a dietitian at the cancer center for evaluation if needed.  I also strongly encouraged the patient to quit smoking.  She was advised to call if she has any concerning symptoms in the interval.   The patient voices understanding of current disease status and treatment options and is in agreement with the current care plan.

## 2017-10-05 ENCOUNTER — Other Ambulatory Visit (HOSPITAL_COMMUNITY): Payer: Self-pay | Admitting: Interventional Radiology

## 2017-10-05 ENCOUNTER — Encounter (HOSPITAL_COMMUNITY): Payer: Self-pay

## 2017-10-05 ENCOUNTER — Encounter: Payer: Self-pay | Admitting: Radiation Oncology

## 2017-10-05 ENCOUNTER — Other Ambulatory Visit: Payer: Self-pay | Admitting: Internal Medicine

## 2017-10-05 DIAGNOSIS — R911 Solitary pulmonary nodule: Secondary | ICD-10-CM

## 2017-10-05 HISTORY — PX: IR US GUIDE VASC ACCESS RIGHT: IMG2390

## 2017-10-06 ENCOUNTER — Other Ambulatory Visit: Payer: Self-pay | Admitting: Radiology

## 2017-10-06 ENCOUNTER — Emergency Department (HOSPITAL_COMMUNITY)
Admission: EM | Admit: 2017-10-06 | Discharge: 2017-10-06 | Disposition: A | Discharge status: Home / Self Care | Payer: Medicaid Other | Attending: Emergency Medicine | Admitting: Emergency Medicine

## 2017-10-06 ENCOUNTER — Emergency Department (HOSPITAL_COMMUNITY): Payer: Medicaid Other

## 2017-10-06 ENCOUNTER — Other Ambulatory Visit: Payer: Self-pay

## 2017-10-06 DIAGNOSIS — R1033 Periumbilical pain: Secondary | ICD-10-CM | POA: Insufficient documentation

## 2017-10-06 DIAGNOSIS — R0602 Shortness of breath: Secondary | ICD-10-CM | POA: Insufficient documentation

## 2017-10-06 DIAGNOSIS — C3490 Malignant neoplasm of unspecified part of unspecified bronchus or lung: Secondary | ICD-10-CM | POA: Diagnosis not present

## 2017-10-06 DIAGNOSIS — Z85528 Personal history of other malignant neoplasm of kidney: Secondary | ICD-10-CM | POA: Insufficient documentation

## 2017-10-06 DIAGNOSIS — J449 Chronic obstructive pulmonary disease, unspecified: Secondary | ICD-10-CM | POA: Insufficient documentation

## 2017-10-06 DIAGNOSIS — I10 Essential (primary) hypertension: Secondary | ICD-10-CM | POA: Insufficient documentation

## 2017-10-06 DIAGNOSIS — F419 Anxiety disorder, unspecified: Secondary | ICD-10-CM | POA: Diagnosis present

## 2017-10-06 DIAGNOSIS — F17210 Nicotine dependence, cigarettes, uncomplicated: Secondary | ICD-10-CM | POA: Diagnosis not present

## 2017-10-06 DIAGNOSIS — Z79899 Other long term (current) drug therapy: Secondary | ICD-10-CM | POA: Diagnosis not present

## 2017-10-06 DIAGNOSIS — R3915 Urgency of urination: Secondary | ICD-10-CM | POA: Diagnosis not present

## 2017-10-06 DIAGNOSIS — G8929 Other chronic pain: Secondary | ICD-10-CM | POA: Diagnosis not present

## 2017-10-06 LAB — URINALYSIS, ROUTINE W REFLEX MICROSCOPIC
Bacteria, UA: NONE SEEN
Bilirubin Urine: NEGATIVE
Glucose, UA: NEGATIVE mg/dL
Ketones, ur: NEGATIVE mg/dL
Nitrite: NEGATIVE
Protein, ur: NEGATIVE mg/dL
Specific Gravity, Urine: 1.014 (ref 1.005–1.030)
pH: 7 (ref 5.0–8.0)

## 2017-10-06 LAB — BASIC METABOLIC PANEL
Anion gap: 9 (ref 5–15)
BUN: 10 mg/dL (ref 8–23)
CO2: 27 mmol/L (ref 22–32)
Calcium: 10 mg/dL (ref 8.9–10.3)
Chloride: 103 mmol/L (ref 98–111)
Creatinine, Ser: 0.78 mg/dL (ref 0.44–1.00)
GFR calc Af Amer: >60 mL/min (ref >60)
GFR calc non Af Amer: >60 mL/min (ref >60)
Glucose, Bld: 121 mg/dL — ABNORMAL HIGH (ref 70–99)
Potassium: 5 mmol/L (ref 3.5–5.1)
Sodium: 139 mmol/L (ref 135–145)

## 2017-10-06 LAB — CBC WITH DIFFERENTIAL/PLATELET
Basophils Absolute: 0 10*3/uL (ref 0.0–0.1)
Basophils Relative: 0 %
Eosinophils Absolute: 0.1 10*3/uL (ref 0.0–0.7)
Eosinophils Relative: 1 %
HCT: 33.3 % — ABNORMAL LOW (ref 36.0–46.0)
Hemoglobin: 10.5 g/dL — ABNORMAL LOW (ref 12.0–15.0)
Lymphocytes Relative: 17 %
Lymphs Abs: 1.2 10*3/uL (ref 0.7–4.0)
MCH: 28.2 pg (ref 26.0–34.0)
MCHC: 31.5 g/dL (ref 30.0–36.0)
MCV: 89.3 fL (ref 78.0–100.0)
Monocytes Absolute: 0.5 10*3/uL (ref 0.1–1.0)
Monocytes Relative: 7 %
Neutro Abs: 5.2 10*3/uL (ref 1.7–7.7)
Neutrophils Relative %: 75 %
Platelets: 332 10*3/uL (ref 150–400)
RBC: 3.73 MIL/uL — ABNORMAL LOW (ref 3.87–5.11)
RDW: 16 % — ABNORMAL HIGH (ref 11.5–15.5)
WBC: 7.1 10*3/uL (ref 4.0–10.5)

## 2017-10-06 LAB — CULTURE, BLOOD (ROUTINE X 2)
CULTURE: NO GROWTH
SPECIAL REQUESTS: ADEQUATE

## 2017-10-06 LAB — I-STAT TROPONIN, ED: Troponin i, poc: 0.02 ng/mL (ref 0.00–0.08)

## 2017-10-06 LAB — D-DIMER, QUANTITATIVE: D-Dimer, Quant: 0.61 ug/mL-FEU — ABNORMAL HIGH (ref 0.00–0.50)

## 2017-10-06 MED ORDER — ACETAMINOPHEN 500 MG PO TABS
1000.0000 mg | ORAL_TABLET | Freq: Once | ORAL | Status: AC
  Administered 2017-10-06: 1000 mg via ORAL
  Filled 2017-10-06: qty 2

## 2017-10-06 MED ORDER — HYDROXYZINE HCL 25 MG PO TABS: 25.0000 mg | ORAL_TABLET | Freq: Four times a day (QID) | ORAL | 0 refills | Status: DC | PRN

## 2017-10-06 MED ORDER — ALPRAZOLAM 0.25 MG PO TABS
0.2500 mg | ORAL_TABLET | Freq: Once | ORAL | Status: AC
  Administered 2017-10-06: 0.25 mg via ORAL
  Filled 2017-10-06: qty 1

## 2017-10-06 NOTE — ED Notes (Signed)
Pt made aware of a need for a urine sample.  Pt given PO fluids.

## 2017-10-06 NOTE — ED Triage Notes (Signed)
Transported by [facility] from home--chief compliant anxiety and trouble with urination since yesterday +urge. Also reports ongoing SHOB.

## 2017-10-06 NOTE — ED Notes (Signed)
2 unsuccessful IV attempts by charge RN.  IV team consult placed.

## 2017-10-06 NOTE — ED Notes (Signed)
Attempted 2 [place] IV without success. Blood work obtained. RN aware.

## 2017-10-06 NOTE — ED Notes (Signed)
Bed: WA02 Expected date:  Expected time:  Means of arrival:  Comments: EMS anxiety, difficulty urinating

## 2017-10-06 NOTE — ED Notes (Signed)
Patient ambulated to bathroom independently but was not able to provide urine sample.

## 2017-10-06 NOTE — ED Provider Notes (Signed)
[facility] DEPT Provider Note   CSN: [id number] Arrival date & time: 10/06/17  0905     History   Chief Complaint Chief Complaint  Patient presents with  . Anxiety    HPI [name] is a 67 y.o. female with history of COPD, recently diagnosed lung cancer, substance abuse, drug-seeking behavior who presents with recurrent anxiety and panic attacks and a 2-day history of urinary urgency.  Patient has had some suprapubic pressure and pain, but denies any other abdominal pain or new back pain.  Patient denies any nausea, vomiting, urinary frequency.  Patient has had ongoing shortness of breath and is on 4 L of oxygen at home.  She is also had rib pain since she has been diagnosed with cancer.  All of this is unchanged.  Patient had a full work-up 5 days ago and diagnosed with anxiety.  She was discharged home with a few days of Xanax which was very helpful for her anxiety and panic attacks at home, but now has run out.  She reports thinking about her cancer diagnosis makes her anxiety and nervousness worse.  She also notes chronic pain that is not being controlled by her current pain regimen.  She sees a pain clinic for this.  HPI  Past Medical History:  Diagnosis Date  . Cancer ([place])    renal ca  . COPD (chronic obstructive pulmonary disease) ([place])   . Drug-seeking behavior   . Pancreatitis   . Pancreatitis   . Seizures ([place])   . Substance abuse [facility])     Patient Active Problem List   Diagnosis Date Noted  . Lung mass 10/04/2017  . Pneumonia 09/19/2017  . PNA (pneumonia) 09/17/2017  . Osteoarthritis of facet joint of lumbar spine 07/26/2017  . Low back pain radiating to both legs 07/26/2017  . 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 Failure to thrive in adult 06/21/2017  . Pressure injury of skin 04/06/2017  . Pressure injury of skin of right ischial tuberosity region: Stage 2 04/06/2017  . Acute urinary  retention 04/04/2017  . Benzodiazepine dependence ([place])   . AKI (acute kidney injury) ([place]) 04/03/2017  . Hip fracture, right ([place]) 06/02/2016  . Narcotic addiction ([place]) 02/29/2016  . Abdominal pain, chronic, epigastric- due to chronic pancreatitis 02/29/2016  . COPD (chronic obstructive pulmonary disease) ([place]) 02/28/2016  . Cough 09/03/2015  . Abdominal pain 01/03/2015  . Hyponatremia 01/03/2015  . Hypocalcemia 01/03/2015  . Underweight 01/03/2015  . COPD GOLD III with reversible component  07/17/2014  . DTs (delirium tremens) ([place]) 06/02/2013  . Chronic alcoholic pancreatitis ([place]) 06/02/2013  . Lactic acidosis 06/02/2013  . Recurrent acute pancreatitis 03/28/2013  . Alcohol withdrawal ([place]) 03/28/2013  . Pancreatitis 03/09/2013  . Tobacco abuse 03/09/2013  . Alcohol withdrawal syndrome with perceptual disturbance ([place]) 03/09/2013  . Acute alcoholic pancreatitis 67/12/4578  . Benzodiazepine withdrawal ([place]) 02/09/2013  . Protein-calorie malnutrition, severe ([place]) 02/09/2013  . Hypokalemia 02/27/2011  . Abdominal pain, acute 02/25/2011  . Nausea and vomiting 02/25/2011  . Thrombocytopenia ([place]) 02/25/2011  . COPD with acute exacerbation ([place]) 02/25/2011  . HTN (hypertension), malignant 02/25/2011  . Malignant neoplasm of kidney excluding renal pelvis ([place]) 10/18/2008  . Anxiety state 10/18/2008  . GERD 10/18/2008  . PANCREATITIS, CHRONIC- atrophic pancreas 10/18/2008  . DYSPNEA 10/18/2008  . GASTRIC ULCER, HX OF 10/18/2008    Past Surgical History:  Procedure Laterality Date  . IR GENERIC HISTORICAL  05/08/2015   IR  RADIOLOGIST EVAL & MGMT 05/08/2015 Aletta Edouard, MD [facility] INTERV RAD  . IR GENERIC HISTORICAL  01/08/2014   IR RADIOLOGIST EVAL & MGMT 01/08/2014 Aletta Edouard, MD [facility] INTERV RAD  . IR RADIOLOGY PERIPHERAL GUIDED IV START  10/03/2017  . IR US GUIDE VASC ACCESS RIGHT  10/05/2017  . KIDNEY SURGERY     removed cancerous lesions  . PARTIAL GASTRECTOMY        OB History   None      Home Medications    Prior to Admission medications   Medication Sig Start Date End Date Taking? Authorizing Provider  amoxicillin-clavulanate (AUGMENTIN) 875-125 MG tablet Take 1 tablet by mouth 2 (two) times daily. One po bid x 7 days 10/01/17  Yes Pfeiffer, Jeannie Done, MD  famotidine (PEPCID) 20 MG tablet Take 1 tablet (20 mg total) by mouth daily. Patient taking differently: Take 20 mg by mouth daily as needed for heartburn or indigestion.  08/28/17  Yes Khatri, Hina, PA-C  ALPRAZolam (XANAX) 0.25 MG tablet Take 1 tablet (0.25 mg total) by mouth 3 (three) times daily as needed for anxiety. Patient not taking: Reported on 10/06/2017 10/01/17   Charlesetta Shanks, MD  hydrOXYzine (ATARAX/VISTARIL) 25 MG tablet Take 1 tablet (25 mg total) by mouth every 6 (six) hours as needed for anxiety. 10/06/17   Chevon Fomby, Bea Graff, PA-C  lidocaine (LIDODERM) 5 % Place 1 patch onto the skin daily. Remove & Discard patch within 12 hours or as directed by MD Patient not taking: Reported on 10/01/2017 09/25/17   Dana Allan I, MD  mirabegron ER (MYRBETRIQ) 25 MG TB24 tablet Take 1 tablet (25 mg total) by mouth daily. Patient not taking: Reported on 10/01/2017 09/25/17 10/25/17  Dana Allan I, MD  mometasone-formoterol [facility]) 200-5 MCG/ACT AERO Inhale 2 puffs into the lungs 2 (two) times daily. Patient taking differently: Inhale 3 puffs into the lungs daily.  08/08/17   Azzie Glatter, FNP  Multiple Vitamin (MULTIVITAMIN) LIQD Take 15 mLs by mouth daily. Patient not taking: Reported on 10/01/2017 09/25/17 10/25/17  Dana Allan I, MD  ondansetron [facility]) 4 MG tablet Take 4 mg by mouth daily as needed for nausea/vomiting. 09/26/17   [provider]  polyethylene glycol powder (GLYCOLAX/MIRALAX) powder Mix 1 capful of powder into drink and take by mouth 1-3 times daily until daily soft stools OTC Patient not taking: Reported on 10/01/2017 08/30/17   Little, Wenda Overland,  MD  PROAIR HFA 108 [phone] Base) MCG/ACT inhaler Inhale 1 puff into the lungs every 4 (four) hours as needed for wheezing or shortness of breath.  08/02/17   [provider]  SPIRIVA HANDIHALER 18 MCG inhalation capsule PLACE 1 CAPSULE INTO INHALER AND INHALE DAILY Patient taking differently: Place 18 mcg into inhaler and inhale daily as needed (shortness of breath).  09/08/17   Azzie Glatter, FNP  SUBOXONE 8-2 MG FILM Take 1 Film by mouth every 12 (twelve) hours. 09/30/17   [provider]  tamsulosin (FLOMAX) 0.4 MG CAPS capsule Take 1 capsule (0.4 mg total) by mouth daily. 09/25/17   Bonnell Public, MD    Family History Family History  Problem Relation Age of Onset  . CAD Mother   . Alcoholism Father   . COPD Father     Social History Social History   Tobacco Use  . Smoking status: Current Every Day Smoker    Packs/day: 1.50    Years: 30.00    Pack years: 45.00    Types: Cigarettes  .  Smokeless tobacco: Never Used  . Tobacco comment: smoking up to 1.5 ppd per husband. down from 3pk day to 1.5   Substance Use Topics  . Alcohol use: Not Currently    Alcohol/week: 0.0 standard drinks    Comment: "I stopped drinking 1 year ago"  . Drug use: No    Comment: Hx of polysubstance drug abuse     Allergies   Aspirin and Chlorpromazine hcl   Review of Systems Review of Systems  Constitutional: Negative for chills and fever.  HENT: Negative for facial swelling and sore throat.   Respiratory: Positive for shortness of breath.   Cardiovascular: Negative for chest pain.  Gastrointestinal: Negative for abdominal pain, nausea and vomiting.  Genitourinary: Positive for urgency. Negative for dysuria.  Musculoskeletal: Positive for back pain (chronic).  Skin: Negative for rash and wound.  Neurological: Negative for headaches.  Psychiatric/Behavioral: The patient is nervous/anxious.      Physical Exam Updated Vital Signs BP (!) 149/112 (BP Location: Left Arm)    Pulse 99   Temp 98.4 F (36.9 C) (Oral)   Resp 18   SpO2 94%   Physical Exam  Constitutional: She appears well-developed and well-nourished. No distress.  Cachectic appearing Hyperventilating, but able to control with direction of controlled breathing  HENT:  Head: Normocephalic and atraumatic.  Mouth/Throat: Oropharynx is clear and moist. No oropharyngeal exudate.  Eyes: Pupils are equal, round, and reactive to light. Conjunctivae are normal. Right eye exhibits no discharge. Left eye exhibits no discharge. No scleral icterus.  Neck: Normal range of motion. Neck supple. No thyromegaly present.  Cardiovascular: Normal rate, regular rhythm, normal heart sounds and intact distal pulses. Exam reveals no gallop and no friction rub.  No murmur heard. Pulmonary/Chest: Effort normal and breath sounds normal. No stridor. No respiratory distress. She has no wheezes. She has no rales.  Abdominal: Soft. Bowel sounds are normal. She exhibits no distension. There is tenderness in the suprapubic area. There is no rebound, no guarding and no CVA tenderness.  Scaphoid abdomen  Musculoskeletal: She exhibits no edema.  Thin, wasting  Lymphadenopathy:    She has no cervical adenopathy.  Neurological: She is alert. Coordination normal.  Skin: Skin is warm and dry. No rash noted. She is not diaphoretic. No pallor.  Psychiatric: She has a normal mood and affect.  Nursing note and vitals reviewed.    ED Treatments / Results  Labs (all labs ordered are listed, but only abnormal results are displayed) Labs Reviewed  URINALYSIS, ROUTINE W REFLEX MICROSCOPIC - Abnormal; Notable for the following components:      Result Value   Hgb urine dipstick MODERATE (*)    Leukocytes, UA TRACE (*)    All other components within normal limits  BASIC METABOLIC PANEL - Abnormal; Notable for the following components:   Glucose, Bld 121 (*)    All other components within normal limits  CBC WITH DIFFERENTIAL/PLATELET  - Abnormal; Notable for the following components:   RBC 3.73 (*)    Hemoglobin 10.5 (*)    HCT 33.3 (*)    RDW 16.0 (*)    All other components within normal limits  D-DIMER, QUANTITATIVE (NOT AT [facility]) - Abnormal; Notable for the following components:   D-Dimer, Quant 0.61 (*)    All other components within normal limits  URINE CULTURE  I-STAT TROPONIN, ED    EKG None  Radiology Ir US Guide Vasc Access Right  Result Date: 10/05/2017 CLINICAL DATA:  Difficult peripheral IV access.  Lung nodule. Needs access for PET-CT.  EXAM: IR RADIOLOGY PERIPHERAL GUIDED IV START  ANESTHESIA/SEDATION: None  MEDICATIONS: Lidocaine 1% subcutaneous  PROCEDURE: The procedure, risks (including but not limited to bleeding, infection, organ damage ), benefits, and alternatives were explained to the patient. Questions regarding the procedure were encouraged and answered. The patient understands and consents to the procedure.  Patency of the right brachial vein confirmed by ultrasound and documented. Overlying skin prepped with chlorhexidine, draped in usual sterile fashion. Maximal barrier sterile technique was utilized including caps, mask, sterile gowns, sterile gloves, sterile drape, hand hygiene and skin antiseptic. Under real-time ultrasound guidance, the vein was accessed with a 21-gauge micropuncture needle, exchanged over a 018 guidewire for a 3 French vascular dilator. Dilator flushed and aspirated easily. Dilator was capped, flushed, and secured.  COMPLICATIONS: None immediate  FINDINGS: Patent right brachial vein.  Peripheral IV placed.  IMPRESSION: 1. Technically successful right brachial vein peripheral IV placement under ultrasound. Okay for routine use.   Electronically Signed   By: Lucrezia Europe M.D.   On: 10/03/2017 09:13  CLINICAL DATA:  Difficult peripheral IV access. Lung nodule. Needs access for PET-CT.  EXAM: IR RADIOLOGY PERIPHERAL GUIDED IV START  ANESTHESIA/SEDATION: None  MEDICATIONS:  Lidocaine 1% subcutaneous  PROCEDURE: The procedure, risks (including but not limited to bleeding, infection, organ damage ), benefits, and alternatives were explained to the patient. Questions regarding the procedure were encouraged and answered. The patient understands and consents to the procedure.  Patency of the right brachial vein confirmed by ultrasound and documented. Overlying skin prepped with chlorhexidine, draped in usual sterile fashion. Maximal barrier sterile technique was utilized including caps, mask, sterile gowns, sterile gloves, sterile drape, hand hygiene and skin antiseptic. Under real-time ultrasound guidance, the vein was accessed with a 21-gauge micropuncture needle, exchanged over a 018 guidewire for a 3 French vascular dilator. Dilator flushed and aspirated easily. Dilator was capped, flushed, and secured.  COMPLICATIONS: None immediate  FINDINGS: Patent right brachial vein.  Peripheral IV placed.  IMPRESSION: 1. Technically successful right brachial vein peripheral IV placement under ultrasound. Okay for routine use.   Electronically Signed   By: Lucrezia Europe M.D.   On: 10/03/2017 09:13  Dg Chest Portable 1 View  Result Date: 10/06/2017 CLINICAL DATA:  Shortness of breath EXAM: PORTABLE CHEST 1 VIEW COMPARISON:  10/03/2017 FINDINGS: Cardiac shadow is within normal limits. Aortic calcifications are noted. The lungs are hyperinflated consistent with COPD. Patient's known left lower lobe mass lesion is again noted adjacent to the spine in a retrocardiac position. The known nodular changes in the bases are less well appreciated than on the prior exam. The band like density in the right base is again noted and stable. No bony abnormality is seen. IMPRESSION: Stable left lower lobe mass lesion and right basilar scarring. COPD. Electronically Signed   By: Inez [place] M.D.   On: 10/06/2017 11:39    Procedures Procedures (including critical care time)  Medications Ordered in  ED Medications  ALPRAZolam Duanne Moron) tablet 0.25 mg (0.25 mg Oral Given 10/06/17 0949)  ALPRAZolam Duanne Moron) tablet 0.25 mg (0.25 mg Oral Given 10/06/17 1038)  acetaminophen (TYLENOL) tablet 1,000 mg (1,000 mg Oral Given 10/06/17 1242)     Initial Impression / Assessment and Plan / ED Course  I have reviewed the triage vital signs and the nursing notes.  Pertinent labs & imaging results that were available during my care of the patient were reviewed by me and considered in my medical  decision making (see chart for details).     Patient presenting with acute on chronic anxiety.  Patient's anxiety has been increased related to her upcoming procedures for her newly diagnosed cancer.  She is feeling much better after Xanax in the ED.  Labs are stable for the patient.  D-dimer is age-adjusted negative.  No new symptoms and now that patient's anxiety is controlled, no tachycardia.  If she is breathing at her baseline.  Patient given Tylenol for pain in the ED.  She is advised to follow-up with her PCP for further management of her anxiety, but will discharge home with few days of Vistaril.  Patient advised to follow-up with her pain management clinic for further control of her chronic pain.  Return precautions discussed.  Patient understands and agrees with plan.  Patient vitals stable discharge in satisfactory condition. I discussed patient case with Dr. Billy Fischer who guided the patient's management and agrees with plan.   Final Clinical Impressions(s) / ED Diagnoses   Final diagnoses:  Anxiety  Other chronic pain    ED Discharge Orders         Ordered    hydrOXYzine (ATARAX/VISTARIL) 25 MG tablet  Every 6 hours PRN     10/06/17 [address] [place], PA-C 10/06/17 1307    Gareth Morgan, MD 10/07/17 2206

## 2017-10-06 NOTE — Discharge Instructions (Addendum)
Take Vistaril every 6 hours as needed for anxiety.  Please follow-up with your primary care provider for further management of your anxiety and your pain management clinic for further management of your pain.  Please return the emergency department if you develop any new or worsening symptoms.

## 2017-10-07 ENCOUNTER — Other Ambulatory Visit: Payer: Self-pay | Admitting: Student

## 2017-10-07 LAB — URINE CULTURE: Culture: NO GROWTH

## 2017-10-10 ENCOUNTER — Other Ambulatory Visit: Payer: Self-pay | Admitting: Physician Assistant

## 2017-10-10 ENCOUNTER — Ambulatory Visit (HOSPITAL_COMMUNITY)
Admission: RE | Admit: 2017-10-10 | Discharge: 2017-10-10 | Disposition: A | Discharge status: Home / Self Care | Payer: Medicaid Other | Source: Ambulatory Visit | Attending: Oncology | Admitting: Oncology

## 2017-10-10 ENCOUNTER — Other Ambulatory Visit: Payer: Self-pay | Admitting: Oncology

## 2017-10-10 ENCOUNTER — Encounter (HOSPITAL_COMMUNITY): Payer: Self-pay | Admitting: Interventional Radiology

## 2017-10-10 ENCOUNTER — Ambulatory Visit (HOSPITAL_COMMUNITY)
Admission: RE | Admit: 2017-10-10 | Discharge: 2017-10-10 | Disposition: A | Discharge status: Home / Self Care | Payer: Medicaid Other | Source: Ambulatory Visit | Attending: Interventional Radiology | Admitting: Interventional Radiology

## 2017-10-10 DIAGNOSIS — J439 Emphysema, unspecified: Secondary | ICD-10-CM | POA: Insufficient documentation

## 2017-10-10 DIAGNOSIS — Z85528 Personal history of other malignant neoplasm of kidney: Secondary | ICD-10-CM | POA: Diagnosis not present

## 2017-10-10 DIAGNOSIS — Z8249 Family history of ischemic heart disease and other diseases of the circulatory system: Secondary | ICD-10-CM | POA: Insufficient documentation

## 2017-10-10 DIAGNOSIS — Z9889 Other specified postprocedural states: Secondary | ICD-10-CM | POA: Diagnosis present

## 2017-10-10 DIAGNOSIS — Z79899 Other long term (current) drug therapy: Secondary | ICD-10-CM | POA: Insufficient documentation

## 2017-10-10 DIAGNOSIS — J90 Pleural effusion, not elsewhere classified: Secondary | ICD-10-CM | POA: Diagnosis not present

## 2017-10-10 DIAGNOSIS — Z886 Allergy status to analgesic agent status: Secondary | ICD-10-CM | POA: Insufficient documentation

## 2017-10-10 DIAGNOSIS — R918 Other nonspecific abnormal finding of lung field: Secondary | ICD-10-CM

## 2017-10-10 DIAGNOSIS — Z836 Family history of other diseases of the respiratory system: Secondary | ICD-10-CM | POA: Diagnosis not present

## 2017-10-10 DIAGNOSIS — Z792 Long term (current) use of antibiotics: Secondary | ICD-10-CM | POA: Diagnosis not present

## 2017-10-10 DIAGNOSIS — Z811 Family history of alcohol abuse and dependence: Secondary | ICD-10-CM | POA: Insufficient documentation

## 2017-10-10 DIAGNOSIS — F17210 Nicotine dependence, cigarettes, uncomplicated: Secondary | ICD-10-CM | POA: Diagnosis not present

## 2017-10-10 DIAGNOSIS — F419 Anxiety disorder, unspecified: Secondary | ICD-10-CM | POA: Insufficient documentation

## 2017-10-10 HISTORY — PX: IR US GUIDE VASC ACCESS RIGHT: IMG2390

## 2017-10-10 HISTORY — PX: IR RADIOLOGY PERIPHERAL GUIDED IV START: IMG5598

## 2017-10-10 LAB — CBC
HEMATOCRIT: 31.3 % — AB (ref 36.0–46.0)
Hemoglobin: 9.8 g/dL — ABNORMAL LOW (ref 12.0–15.0)
MCH: 28.4 pg (ref 26.0–34.0)
MCHC: 31.3 g/dL (ref 30.0–36.0)
MCV: 90.7 fL (ref 78.0–100.0)
PLATELETS: 275 10*3/uL (ref 150–400)
RBC: 3.45 MIL/uL — AB (ref 3.87–5.11)
RDW: 15.5 % (ref 11.5–15.5)
WBC: 9.5 10*3/uL (ref 4.0–10.5)

## 2017-10-10 MED ORDER — FENTANYL CITRATE (PF) 100 MCG/2ML IJ SOLN
INTRAMUSCULAR | Status: AC
  Filled 2017-10-10: qty 4

## 2017-10-10 MED ORDER — MIDAZOLAM HCL 2 MG/2ML IJ SOLN
INTRAMUSCULAR | Status: AC | PRN
  Administered 2017-10-10 (×2): 1 mg via INTRAVENOUS

## 2017-10-10 MED ORDER — LIDOCAINE HCL 1 % IJ SOLN
INTRAMUSCULAR | Status: AC
  Filled 2017-10-10: qty 20

## 2017-10-10 MED ORDER — MIDAZOLAM HCL 2 MG/2ML IJ SOLN
INTRAMUSCULAR | Status: AC
  Filled 2017-10-10: qty 4

## 2017-10-10 MED ORDER — LIDOCAINE HCL (PF) 1 % IJ SOLN
INTRAMUSCULAR | Status: DC | PRN
  Administered 2017-10-10: 5 mL

## 2017-10-10 MED ORDER — SODIUM CHLORIDE 0.9 % IV SOLN: INTRAVENOUS | Status: DC

## 2017-10-10 MED ORDER — OXYCODONE-ACETAMINOPHEN 5-325 MG PO TABS
1.0000 | ORAL_TABLET | Freq: Once | ORAL | Status: AC
  Administered 2017-10-10: 1 via ORAL
  Filled 2017-10-10 (×2): qty 1

## 2017-10-10 MED ORDER — FENTANYL CITRATE (PF) 100 MCG/2ML IJ SOLN
INTRAMUSCULAR | Status: AC | PRN
  Administered 2017-10-10: 25 ug via INTRAVENOUS
  Administered 2017-10-10: 50 ug via INTRAVENOUS
  Administered 2017-10-10: 25 ug via INTRAVENOUS

## 2017-10-10 NOTE — Progress Notes (Addendum)
Pt sitting on the side of the bed getting dressed. Informed her she is still on bedrest. States "As soon as my husband gets here I'm leaving". Informed pt that if she leaves it will be against medical advice. Husband just walked in and I informed him of above. He states she does this every time she is here, and he will talk to her.

## 2017-10-10 NOTE — H&P (Signed)
Chief Complaint: Patient was seen in consultation today for lung mass  Referring Physician(s): Curcio,Kristin R  Supervising Physician: Marybelle Killings  Patient Status: [facility] - Out-pt  History of Present Illness: [name] is a 67 y.o. female with COPD, pancreatitis, seizures, renal cell cancer, and substance abuse who presents with recent findings of lung mass suspicious for metastatic disease from renal cell carcinoma vs. Primary lung malignancy.  IR consulted for lung mass biopsy at the request of Mikey Bussing, NP.  Case reviewed and approved by Dr. Pascal Lux.   Patient presents for procedure today accompanied by her husband.  She is in her usual state of health.  She endorses anxiety.  Of note, she has been seen in the ED several times over the past few months for this.  She has been NPO.  She does not take blood thinners.   Past Medical History:  Diagnosis Date  . Cancer ([place])    renal ca  . COPD (chronic obstructive pulmonary disease) ([place])   . Drug-seeking behavior   . Pancreatitis   . Pancreatitis   . Seizures ([place])   . Substance abuse [facility])     Past Surgical History:  Procedure Laterality Date  . IR GENERIC HISTORICAL  05/08/2015   IR RADIOLOGIST EVAL & MGMT 05/08/2015 Aletta Edouard, MD [facility] INTERV RAD  . IR GENERIC HISTORICAL  01/08/2014   IR RADIOLOGIST EVAL & MGMT 01/08/2014 Aletta Edouard, MD [facility] INTERV RAD  . IR RADIOLOGY PERIPHERAL GUIDED IV START  10/03/2017  . IR US GUIDE VASC ACCESS RIGHT  10/05/2017  . KIDNEY SURGERY     removed cancerous lesions  . PARTIAL GASTRECTOMY      Allergies: Aspirin and Chlorpromazine hcl  Medications: Prior to Admission medications   Medication Sig Start Date End Date Taking? Authorizing Provider  ALPRAZolam (XANAX) 0.25 MG tablet Take 1 tablet (0.25 mg total) by mouth 3 (three) times daily as needed for anxiety. Patient not taking: Reported on 10/06/2017 10/01/17   Charlesetta Shanks, MD  amoxicillin-clavulanate  (AUGMENTIN) 875-125 MG tablet Take 1 tablet by mouth 2 (two) times daily. One po bid x 7 days Patient not taking: Reported on 10/10/2017 10/01/17   Charlesetta Shanks, MD  busPIRone (BUSPAR) 10 MG tablet Take 10 mg by mouth 3 (three) times daily as needed (anxiety).    [provider]  famotidine (PEPCID) 20 MG tablet Take 1 tablet (20 mg total) by mouth daily. Patient taking differently: Take 20 mg by mouth daily as needed for heartburn or indigestion.  08/28/17   Khatri, Hina, PA-C  hydrOXYzine (ATARAX/VISTARIL) 25 MG tablet Take 1 tablet (25 mg total) by mouth every 6 (six) hours as needed for anxiety. 10/06/17   Law, Bea Graff, PA-C  lidocaine (LIDODERM) 5 % Place 1 patch onto the skin daily. Remove & Discard patch within 12 hours or as directed by MD Patient not taking: Reported on 10/01/2017 09/25/17   Dana Allan I, MD  losartan (COZAAR) 50 MG tablet Take 50 mg by mouth daily.    [provider]  mirabegron ER (MYRBETRIQ) 25 MG TB24 tablet Take 1 tablet (25 mg total) by mouth daily. Patient not taking: Reported on 10/01/2017 09/25/17 10/25/17  Dana Allan I, MD  mometasone-formoterol [facility] 200-5 MCG/ACT AERO Inhale 2 puffs into the lungs 2 (two) times daily. Patient taking differently: Inhale 3 puffs into the lungs daily.  08/08/17   Azzie Glatter, FNP  Multiple Vitamin (MULTIVITAMIN) LIQD Take 15 mLs by mouth daily.  Patient not taking: Reported on 10/01/2017 09/25/17 10/25/17  Dana Allan I, MD  ondansetron [facility]) 4 MG tablet Take 4 mg by mouth daily as needed for nausea/vomiting. 09/26/17   [provider]  polyethylene glycol powder (GLYCOLAX/MIRALAX) powder Mix 1 capful of powder into drink and take by mouth 1-3 times daily until daily soft stools OTC Patient not taking: Reported on 10/01/2017 08/30/17   Little, Wenda Overland, MD  PROAIR HFA [phone] Base) MCG/ACT inhaler Inhale 1 puff into the lungs every 4 (four) hours as needed for wheezing or  shortness of breath.  08/02/17   [provider]  SPIRIVA HANDIHALER 18 MCG inhalation capsule PLACE 1 CAPSULE INTO INHALER AND INHALE DAILY Patient taking differently: Place 18 mcg into inhaler and inhale daily as needed (shortness of breath).  09/08/17   Azzie Glatter, FNP  SUBOXONE 8-2 MG FILM Take 1 Film by mouth every 12 (twelve) hours. 09/30/17   [provider]  tamsulosin (FLOMAX) 0.4 MG CAPS capsule Take 1 capsule (0.4 mg total) by mouth daily. Patient not taking: Reported on 10/10/2017 09/25/17   Bonnell Public, MD     Family History  Problem Relation Age of Onset  . CAD Mother   . Alcoholism Father   . COPD Father     Social History   Socioeconomic History  . Marital status: Married    Spouse name: Not on file  . Number of children: Not on file  . Years of education: Not on file  . Highest education level: Not on file  Occupational History  . Not on file  Social Needs  . Financial resource strain: Not on file  . Food insecurity:    Worry: Not on file    Inability: Not on file  . Transportation needs:    Medical: Not on file    Non-medical: Not on file  Tobacco Use  . Smoking status: Current Every Day Smoker    Packs/day: 1.50    Years: 30.00    Pack years: 45.00    Types: Cigarettes  . Smokeless tobacco: Never Used  . Tobacco comment: smoking up to 1.5 ppd per husband. down from 3pk day to 1.5   Substance and Sexual Activity  . Alcohol use: Not Currently    Alcohol/week: 0.0 standard drinks    Comment: "I stopped drinking 1 year ago"  . Drug use: No    Comment: Hx of polysubstance drug abuse  . Sexual activity: Never  Lifestyle  . Physical activity:    Days per week: Not on file    Minutes per session: Not on file  . Stress: Not on file  Relationships  . Social connections:    Talks on phone: Not on file    Gets together: Not on file    Attends religious service: Not on file    Active member of club or organization: Not on file     Attends meetings of clubs or organizations: Not on file    Relationship status: Not on file  Other Topics Concern  . Not on file  Social History Narrative  . Not on file     Review of Systems: A 12 point ROS discussed and pertinent positives are indicated in the HPI above.  All other systems are negative.  Review of Systems  Constitutional: Negative for fatigue and fever.  Respiratory: Negative for cough and shortness of breath.   Cardiovascular: Negative for chest pain.  Gastrointestinal: Negative for abdominal pain, constipation  and nausea.  Musculoskeletal: Negative for back pain.  Psychiatric/Behavioral: Negative for behavioral problems and confusion. The patient is nervous/anxious.     Vital Signs: BP (!) 163/89   Pulse 92   Temp 98.1 F (36.7 C) (Oral)   Ht 5\' 3"  (1.6 m)   Wt 75 lb (34 kg)   SpO2 100%   BMI 13.29 kg/m   Physical Exam  Constitutional: She is oriented to person, place, and time. She appears well-developed. No distress.  Cardiovascular: Normal rate and regular rhythm. Exam reveals no gallop and no friction rub.  No murmur heard. Pulmonary/Chest: Effort normal and breath sounds normal. No respiratory distress.  Abdominal: Soft. She exhibits no distension. There is no tenderness.  Neurological: She is alert and oriented to person, place, and time.  Skin: Skin is warm and dry. She is not diaphoretic.  Psychiatric: She has a normal mood and affect. Her behavior is normal. Judgment and thought content normal.  Nursing note and vitals reviewed.    MD Evaluation Airway: WNL Heart: WNL Abdomen: WNL Chest/ Lungs: WNL ASA  Classification: 3 Mallampati/Airway Score: One   Imaging: Dg Chest 2 View  Result Date: 09/17/2017 CLINICAL DATA:  Shortness of breath EXAM: CHEST - 2 VIEW COMPARISON:  09/15/2017 FINDINGS: Cardiac shadow is stable. A retrocardiac mass lesion is again identified similar to that seen on the prior exam on the left. Patchy  infiltrate is again noted in the right lung base stable from the previous exam. No new focal abnormality is noted. No bony abnormality is seen. IMPRESSION: Stable right lower lobe infiltrate. Stable left lower lobe mass. Electronically Signed   By: Inez Catalina M.D.   On: 09/17/2017 14:22   Dg Chest 2 View  Result Date: 09/15/2017 CLINICAL DATA:  Pt reports being unable to urinate since last night. Pt hx of COPD and normally on 4L. Pt is SOB and O2 sat is 86%. Smoker. EXAM: CHEST - 2 VIEW COMPARISON:  09/04/2017 FINDINGS: Lungs are markedly hyperinflated. The heart size is normal. In the LEFT LOWER lobe, oval mass is estimated to measure 4.1 x 3.0 centimeters, larger compared to prior studies. There is patchy opacity in the RIGHT lung base, consistent with infectious abnormality. No pulmonary edema. IMPRESSION: Increasing size of LEFT LOWER lobe mass. RIGHT LOWER lobe infiltrate. Electronically Signed   By: Nolon Nations M.D.   On: 09/15/2017 16:30   Ct Chest W Contrast  Result Date: 09/18/2017 CLINICAL DATA:  LEFT lower lobe pulmonary mass. Rib pain. History of LEFT renal clear cell carcinoma in 2009, status post cryoablation of cancer recurrence in 2012. EXAM: CT CHEST WITH CONTRAST TECHNIQUE: Multidetector CT imaging of the chest was performed during intravenous contrast administration. CONTRAST:  57mL OMNIPAQUE IOHEXOL 300 MG/ML  SOLN COMPARISON:  CT chest dated 01/15/2017. Chest x-ray dated 09/17/2017. FINDINGS: Cardiovascular: No thoracic aortic aneurysm or evidence of aortic dissection. Aortic atherosclerosis. Heart size is normal. Diffuse coronary artery calcifications. Mediastinum/Nodes: Scattered small lymph nodes within the mediastinum and perihilar regions. No mass or enlarged lymph nodes seen. Esophagus is unremarkable. Small amount of debris within the trachea and central bronchi. Lungs/Pleura: Mass within the medial aspects of the LEFT lower lobe measures 3.1 x 2.8 cm, corresponding to  findings on recent chest x-rays, new compared to the earlier chest CT of 01/15/2017, highly suggestive of neoplastic mass. Patchy consolidations within the lower lobes bilaterally, dependent aspects, new compared to the earlier chest CT, stable or increasing compared to recent chest x-rays. Small bilateral  pleural effusions. Mild emphysematous changes at the lung apices. Upper Abdomen: No acute findings on limited images of the upper abdomen. Musculoskeletal: New compression fracture deformity involving the superior endplate of the T7 vertebral body, likely acute to subacute, suspected pathologic fracture. No other acute or suspicious osseous finding. IMPRESSION: 1. LEFT lower lobe mass measures 3.1 cm, almost certainly neoplastic. This could represent primary bronchogenic carcinoma or metastatic disease. Recommend tissue sampling. 2. Patchy consolidations at the bilateral lung bases, most likely pneumonia and/or aspiration. 3. Small bilateral pleural effusions. 4. New compression fracture deformity involving the superior endplate of the T7 vertebral body, approximately 30% compressed anteriorly and centrally, acute to subacute in age, suspected pathologic fracture. No associated vertebral body retropulsion or displacement. 5. Diffuse coronary artery calcifications. Aortic Atherosclerosis (ICD10-I70.0) and Emphysema (ICD10-J43.9). Electronically Signed   By: Franki Cabot M.D.   On: 09/18/2017 12:34   Mr Thoracic Spine Wo Contrast  Result Date: 09/19/2017 CLINICAL DATA:  Evaluate thoracic compression deformity. Patient has a chest mass. EXAM: MRI THORACIC SPINE WITHOUT CONTRAST TECHNIQUE: Multiplanar, multisequence MR imaging of the thoracic spine was performed. No intravenous contrast was administered. COMPARISON:  Chest CT scans 09/18/2017 and 01/15/2017. FINDINGS: Study was ordered without contrast. Sensitivity in the detection of metastatic disease is reduced. Alignment: Physiologic. Vertebrae: There is a  mild superior endplate deformity of T7. There is no clear destructive process. There does not appear to be significant bone marrow edema. There is no significant retropulsion. While this deformity is new compared with prior chest CT from 01/15/2017, it does not clearly represent metastatic disease. This abnormality is favored to represent a late subacute to chronic compression fracture, related to osteopenia. Cord:  Normal signal and morphology.  No intraspinal mass lesion. Paraspinal and other soft tissues: The LEFT paravertebral mass is silhouetted by large BILATERAL pleural effusions, and bibasilar edema or consolidation, both of which are increased since yesterday's CT. Disc levels: There is no significant disc protrusion or spinal stenosis. No intraspinal mass lesion is evident on this noncontrast scan. IMPRESSION: Mild superior endplate deformity of T7, without significant bone marrow edema, retropulsion, or marrow replacement. This abnormality is favored to represent a late subacute to chronic compression fracture, likely osteopenic in nature. Worsening BILATERAL pleural effusions and bibasilar consolidation/edema. Electronically Signed   By: Staci Righter M.D.   On: 09/19/2017 16:29   Nm Pet Image Initial (pi) Skull Base To Thigh  Result Date: 10/03/2017 CLINICAL DATA:  Initial treatment strategy for right lung mass. EXAM: NUCLEAR MEDICINE PET SKULL BASE TO THIGH TECHNIQUE: 4.4 mCi F-18 FDG was injected intravenously. Full-ring PET imaging was performed from the skull base to thigh after the radiotracer. CT data was obtained and used for attenuation correction and anatomic localization. Fasting blood glucose: 73 mg/dl COMPARISON:  Multiple exams, including 09/18/2017 FINDINGS: Mediastinal blood pool activity: SUV max 0.5 NECK: High muscular activity in the pterygoid muscles and diffusely throughout the tongue without CT correlate, likely physiologic. Potential symmetric accentuated activity in the  tonsils. There is misregistration between the CT and PET data in the head/neck region. Incidental CT findings: Bilateral carotid atherosclerotic calcification. Severe paucity of adipose tissue. CHEST: 3.0 by 2.8 cm left lower lobe pleural-based mass posteromedially has maximum SUV of 8.0 and central low activity compatible with central necrosis. There is additional faint nodularity in both lower lobes with low-grade associated metabolic activity. For example, a 0.9 by 0.4 cm nodule in the right lower lobe on image 37/8 has a maximum SUV of 0.8  and a subpleural band of airspace opacity in the right lower lobe measuring 2.3 by 1.2 cm has a maximum SUV of 1.4. There is additional scattered indistinctly marginated lower lobe nodularity with small irregular nodules of varying size favoring the lower lobes. There is also some atelectasis or bandlike density below the level of the left lower lobe mass. Mild biapical pleuroparenchymal scarring. Incidental CT findings: Lower lobe consolidation is generally improved from 09/18/2017, and aside from the left lower lobe mass, much of the nodularity and remaining opacity is probably inflammatory based on morphology and appearance. Coronary, aortic arch, and branch vessel atherosclerotic vascular disease. Emphysema noted. ABDOMEN/PELVIS: Left kidney lower pole ablation defect with peripheral rim calcification but no current hypermetabolic activity. Incidental CT findings: Aortoiliac atherosclerotic vascular disease. SKELETON: No focal hypermetabolic activity to suggest skeletal metastasis. Incidental CT findings: Bilateral hip screws. IMPRESSION: 1. 3.0 cm left lower lobe mass is hypermetabolic with maximum SUV of 8.0, compatible with malignancy. This mass has central necrosis. 2. Improved by lateral airspace opacities with bibasilar nodularity and bandlike densities. These have relatively low-level metabolic activity, maximum SUV up to 1.4, and are most likely inflammatory given  the improving response., thought to be physiologic. High activity in the tongue and some neck musculature 3. Other imaging findings of potential clinical significance: Aortic Atherosclerosis (ICD10-I70.0) and Emphysema (ICD10-J43.9). Coronary atherosclerosis. Stable appearance of left kidney lower pole ablation defect with peripheral rim calcification but no current hypermetabolic activity. Electronically Signed   By: Van Clines M.D.   On: 10/03/2017 13:15   Ir US Guide Vasc Access Right  Result Date: 10/05/2017 CLINICAL DATA:  Difficult peripheral IV access. Lung nodule. Needs access for PET-CT.  EXAM: IR RADIOLOGY PERIPHERAL GUIDED IV START  ANESTHESIA/SEDATION: None  MEDICATIONS: Lidocaine 1% subcutaneous  PROCEDURE: The procedure, risks (including but not limited to bleeding, infection, organ damage ), benefits, and alternatives were explained to the patient. Questions regarding the procedure were encouraged and answered. The patient understands and consents to the procedure.  Patency of the right brachial vein confirmed by ultrasound and documented. Overlying skin prepped with chlorhexidine, draped in usual sterile fashion. Maximal barrier sterile technique was utilized including caps, mask, sterile gowns, sterile gloves, sterile drape, hand hygiene and skin antiseptic. Under real-time ultrasound guidance, the vein was accessed with a 21-gauge micropuncture needle, exchanged over a 018 guidewire for a 3 French vascular dilator. Dilator flushed and aspirated easily. Dilator was capped, flushed, and secured.  COMPLICATIONS: None immediate  FINDINGS: Patent right brachial vein.  Peripheral IV placed.  IMPRESSION: 1. Technically successful right brachial vein peripheral IV placement under ultrasound. Okay for routine use.   Electronically Signed   By: Lucrezia Europe M.D.   On: 10/03/2017 09:13  CLINICAL DATA:  Difficult peripheral IV access. Lung nodule. Needs access for PET-CT.  EXAM: IR  RADIOLOGY PERIPHERAL GUIDED IV START  ANESTHESIA/SEDATION: None  MEDICATIONS: Lidocaine 1% subcutaneous  PROCEDURE: The procedure, risks (including but not limited to bleeding, infection, organ damage ), benefits, and alternatives were explained to the patient. Questions regarding the procedure were encouraged and answered. The patient understands and consents to the procedure.  Patency of the right brachial vein confirmed by ultrasound and documented. Overlying skin prepped with chlorhexidine, draped in usual sterile fashion. Maximal barrier sterile technique was utilized including caps, mask, sterile gowns, sterile gloves, sterile drape, hand hygiene and skin antiseptic. Under real-time ultrasound guidance, the vein was accessed with a 21-gauge micropuncture needle, exchanged over a 018 guidewire for a  3 French vascular dilator. Dilator flushed and aspirated easily. Dilator was capped, flushed, and secured.  COMPLICATIONS: None immediate  FINDINGS: Patent right brachial vein.  Peripheral IV placed.  IMPRESSION: 1. Technically successful right brachial vein peripheral IV placement under ultrasound. Okay for routine use.   Electronically Signed   By: Lucrezia Europe M.D.   On: 10/03/2017 09:13  Dg Chest Portable 1 View  Result Date: 10/06/2017 CLINICAL DATA:  Shortness of breath EXAM: PORTABLE CHEST 1 VIEW COMPARISON:  10/03/2017 FINDINGS: Cardiac shadow is within normal limits. Aortic calcifications are noted. The lungs are hyperinflated consistent with COPD. Patient's known left lower lobe mass lesion is again noted adjacent to the spine in a retrocardiac position. The known nodular changes in the bases are less well appreciated than on the prior exam. The band like density in the right base is again noted and stable. No bony abnormality is seen. IMPRESSION: Stable left lower lobe mass lesion and right basilar scarring. COPD. Electronically Signed   By: Inez Catalina M.D.   On: 10/06/2017 11:39   Dg Chest  Port 1 View  Result Date: 10/01/2017 CLINICAL DATA:  Shortness of breath EXAM: PORTABLE CHEST 1 VIEW COMPARISON:  CT 09/18/2017, radiograph 09/17/2017 FINDINGS: Patchy right lower lobe infiltrate. Partially obscured medial left lung base mass. Hyperinflation with emphysematous disease. Stable cardiomediastinal silhouette with aortic atherosclerosis. No pneumothorax. IMPRESSION: 1. No significant interval change in patchy right lung base infiltrate. 2. Stable medial left lung base pulmonary mass. Electronically Signed   By: Donavan Foil M.D.   On: 10/01/2017 15:58   Ir Radiology Peripheral Guided Iv Start  Result Date: 10/03/2017 CLINICAL DATA:  Difficult peripheral IV access. Lung nodule. Needs access for PET-CT. EXAM: IR RADIOLOGY PERIPHERAL GUIDED IV START ANESTHESIA/SEDATION: None MEDICATIONS: Lidocaine 1% subcutaneous PROCEDURE: The procedure, risks (including but not limited to bleeding, infection, organ damage ), benefits, and alternatives were explained to the patient. Questions regarding the procedure were encouraged and answered. The patient understands and consents to the procedure. Patency of the right brachial vein confirmed by ultrasound and documented. Overlying skin prepped with chlorhexidine, draped in usual sterile fashion. Maximal barrier sterile technique was utilized including caps, mask, sterile gowns, sterile gloves, sterile drape, hand hygiene and skin antiseptic. Under real-time ultrasound guidance, the vein was accessed with a 21-gauge micropuncture needle, exchanged over a 018 guidewire for a 3 French vascular dilator. Dilator flushed and aspirated easily. Dilator was capped, flushed, and secured. COMPLICATIONS: None immediate FINDINGS: Patent right brachial vein.  Peripheral IV placed. IMPRESSION: 1. Technically successful right brachial vein peripheral IV placement under ultrasound. Okay for routine use. Electronically Signed   By: Lucrezia Europe M.D.   On: 10/03/2017 09:13     Labs:  CBC: Recent Labs    09/20/17 0348 09/21/17 0842 10/01/17 1542 10/01/17 1553 10/06/17 1151  WBC 8.0 8.3 12.0*  --  7.1  HGB 9.1* 8.7* 8.8* 9.5* 10.5*  HCT 28.5* 26.2* 28.0* 28.0* 33.3*  PLT 242 307 254  --  332    COAGS: Recent Labs    10/01/17 1542  INR 0.94    BMP: Recent Labs    09/20/17 0348 09/21/17 0842 10/01/17 1542 10/01/17 1553 10/06/17 1151  NA 139 138 136 137 139  K 4.6 4.5 4.1 4.1 5.0  CL 114* 109 101 101 103  CO2 20* 22 26  --  27  GLUCOSE 94 77 92 85 121*  BUN 9 10 7* 8 10  CALCIUM 8.2* 8.7* 9.0  --  10.0  CREATININE 0.58 0.62 0.77 0.70 0.78  GFRNONAA >60 >60 >60  --  >60  GFRAA >60 >60 >60  --  >60    LIVER FUNCTION TESTS: Recent Labs    09/04/17 1840 09/13/17 1333 09/17/17 1313 10/01/17 1542  BILITOT 0.4 0.5 0.4 0.4  AST 16 10* 16 16  ALT 10 6 11 11   ALKPHOS 82 123 89 69  PROT 6.1* 7.9 7.2 6.3*  ALBUMIN 3.6 3.2* 3.5 3.3*    TUMOR MARKERS: No results for input(s): AFPTM, CEA, CA199, CHROMGRNA in the last 8760 hours.  Assessment and Plan: Patient with past medical history of renal cell carcinoma, anxiety presents with complaint of lung mass.  IR consulted for lung lesion/mass biopsy at the request of Mikey Bussing, NP. Case reviewed by Dr. Pascal Lux who approves patient for procedure.  Patient presents today in their usual state of health.  She will also need an IV start prior to procedure due to poor venous access.  Husband and patient request MD only for all procedures today.  She has been NPO and is not currently on blood thinners.   Risks and benefits discussed with the patient including, but not limited to bleeding, hemoptysis, respiratory failure requiring intubation, infection, pneumothorax requiring chest tube placement, stroke from air embolism or even death.  All of the patient's questions were answered, patient is agreeable to proceed. Consent signed and in chart.  Thank you for this interesting consult.  I  greatly enjoyed meeting [name] and look forward to participating in their care.  A copy of this report was sent to the requesting provider on this date.  Electronically Signed: Docia Barrier, PA 10/10/2017, 10:16 AM   I spent a total of  30 Minutes   in face to face in clinical consultation, greater than 50% of which was counseling/coordinating care for lung lesion.

## 2017-10-10 NOTE — Progress Notes (Signed)
Iv was almost out. Removed the rest of the way dressing applied to site.

## 2017-10-10 NOTE — Procedures (Signed)
Ct biopsy LLL lung mass 18 g  EBL 0 Comp 0

## 2017-10-10 NOTE — Discharge Instructions (Addendum)
Moderate Conscious Sedation, Adult, Care After These instructions provide you with information about caring for yourself after your procedure. Your health care provider may also give you more specific instructions. Your treatment has been planned according to current medical practices, but problems sometimes occur. Call your health care provider if you have any problems or questions after your procedure. What can I expect after the procedure? After your procedure, it is common:  To feel sleepy for several hours.  To feel clumsy and have poor balance for several hours.  To have poor judgment for several hours.  To vomit if you eat too soon.  Follow these instructions at home: For at least 24 hours after the procedure:   Do not: ? Participate in activities where you could fall or become injured. ? Drive. ? Use heavy machinery. ? Drink alcohol. ? Take sleeping pills or medicines that cause drowsiness. ? Make important decisions or sign legal documents. ? Take care of children on your own.  Rest. Eating and drinking  Follow the diet recommended by your health care provider.  If you vomit: ? Drink water, juice, or soup when you can drink without vomiting. ? Make sure you have little or no nausea before eating solid foods. General instructions  Have a responsible adult stay with you until you are awake and alert.  Take over-the-counter and prescription medicines only as told by your health care provider.  If you smoke, do not smoke without supervision.  Keep all follow-up visits as told by your health care provider. This is important. Contact a health care provider if:  You keep feeling nauseous or you keep vomiting.  You feel light-headed.  You develop a rash.  You have a fever. Get help right away if:  You have trouble breathing. This information is not intended to replace advice given to you by your health care provider. Make sure you discuss any questions you have  with your health care provider. Document Released: 10/11/2012 Document Revised: 05/26/2015 Document Reviewed: 04/12/2015 Elsevier Interactive Patient Education  2018 [place] Biopsy, Care After This sheet gives you information about how to care for yourself after your procedure. Your health care provider may also give you more specific instructions depending on the type of biopsy you had. If you have problems or questions, contact your health care provider. What can I expect after the procedure? After the procedure, it is common to have:  A cough.  A sore throat.  Pain where a needle, bronchoscope, or incision was used to collect a biopsy sample (biopsy site).  Follow these instructions at home: Medicines  Take over-the-counter and prescription medicines only as told by your health care provider.  Do not drive for 24 hours if you were given a sedative.  Do not drink alcohol while taking pain medicine.  Do not drive or use heavy machinery while taking prescription pain medicine.  To prevent or treat constipation while you are taking prescription pain medicine, your health care provider may recommend that you: ? Drink enough fluid to keep your urine clear or pale yellow. ? Take over-the-counter or prescription medicines. ? Eat foods that are high in fiber, such as fresh fruits and vegetables, whole grains, and beans. ? Limit foods that are high in fat and processed sugars, such as fried and sweet foods. Activity  If you had an incision during your procedure, avoid activities that may pull the incision site open.  Return to your normal activities as told by your health  care provider. Ask your health care provider what activities are safe for you. If you had an open biopsy:   Follow instructions from your health care provider about how to take care of your incision. Make sure you: ? Wash your hands with soap and water before you change your bandage (dressing). If soap and  water are not available, use hand sanitizer. ? Change your dressing as told by your health care provider. ? Leave stitches (sutures), skin glue, or adhesive strips in place. These skin closures may need to stay in place for 2 weeks or longer. If adhesive strip edges start to loosen and curl up, you may trim the loose edges. Do not remove adhesive strips completely unless your health care provider tells you to do that.  Check your incision area every day for signs of infection. Check for: ? Redness, swelling, or pain. ? Fluid or blood. ? Warmth. ? Pus or a bad smell. General instructions  It is up to you to get the results of your procedure. Ask your health care provider, or the department that is doing the procedure, when your results will be ready. Contact a health care provider if:  You have a fever.  You have redness, swelling, or pain around your biopsy site.  You have fluid or blood coming from your biopsy site.  Your biopsy site feels warm to the touch.  You have pus or a bad smell coming from your biopsy site. Get help right away if:  You cough up blood.  You have trouble breathing.  You have chest pain. Summary  After the procedure, it is common to have a sore throat and a cough.  Return to your normal activities as told by your health care provider. Ask your health care provider what activities are safe for you.  Take over-the-counter and prescription medicines only as told by your health care provider.  Report any unusual symptoms to your health care provider. This information is not intended to replace advice given to you by your health care provider. Make sure you discuss any questions you have with your health care provider. Document Released: 01/20/2016 Document Revised: 01/20/2016 Document Reviewed: 01/20/2016 Elsevier Interactive Patient Education  [employer].

## 2017-10-10 NOTE — Progress Notes (Signed)
Heart rate 101. C/o anxiety and pain in abdomen and biopsy site left back area. Pt is asking for meds for pain and anxiety. Acie Fredrickson, PA called and informed new orders noted.

## 2017-10-10 NOTE — Progress Notes (Signed)
Dr Barbie Banner called and states CXR is ok and ok for pt to eat. Pt and her husband informed.

## 2017-10-11 ENCOUNTER — Institutional Professional Consult (permissible substitution): Payer: Medicaid Other | Admitting: Cardiothoracic Surgery

## 2017-10-11 ENCOUNTER — Other Ambulatory Visit: Payer: Self-pay

## 2017-10-11 ENCOUNTER — Encounter: Payer: Self-pay | Admitting: Cardiothoracic Surgery

## 2017-10-11 ENCOUNTER — Telehealth: Payer: Self-pay

## 2017-10-11 VITALS — BP 82/54 | HR 120 | Resp 18 | Ht 64.0 in | Wt <= 1120 oz

## 2017-10-11 DIAGNOSIS — R918 Other nonspecific abnormal finding of lung field: Secondary | ICD-10-CM

## 2017-10-11 NOTE — Progress Notes (Signed)
[address]       [place] [address]             [phone]                    Kamil K Mcever [place] Medical Record #[id number] Date of Birth: 01-17-1950  Referring: Maryanna Shape, NP Primary Care: Azzie Glatter, FNP Primary Cardiologist: No primary care provider on file.  Chief Complaint:    Chief Complaint  Patient presents with  . Lung Mass    LLLobe .Marland KitchenMarland KitchenCT CHEST 9/15, PET 10/03/17,,,had CT BX 10/10/17    History of Present Illness:    [name] 67 y.o. female is seen in the office  today for consideration plus lower lobectomy.  The patient has a history of renal cell carcinoma diagnosed in 2009 status post radiofrequency ablation as well as cryoablation last treatment was in 2012. Over the past 6 months the patient has had 17 visits to the emergency room, with COPD exacerbation, possible pneumonia, anxiety, general weakness.  In early September she was admitted for week to [facility] with pneumonia.  CT scan of the chest showed a 3 cm left lower lobe lung mass that was not present on a CT scan done in January 2019.  Subsequent PET scan was done and reported below.  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  Final path is pending.  The patient is extremely cachectic, she notes she has gained 3 pounds over the past month.  She has known severe underlying COPD 4 years ago pulmonary function studies showed an FEV1 of 0.7.   Current Activity/ Functional Status:  Patient is independent with mobility/ambulation, transfers, ADL's, IADL's.   Zubrod Score: At the time of surgery this patient's most appropriate activity status/level should be described as: []     0    Normal activity, no symptoms []     1    Restricted in physical strenuous activity but ambulatory, able to do out light work []     2    Ambulatory and capable of  self care, unable to do work activities, up and about               >50 % of waking hours                              [x]     3    Only limited self care, in bed greater than 50% of waking hours []     4    Completely disabled, no self care, confined to bed or chair []     5    Moribund   Past Medical History:  Diagnosis Date  . Cancer ([place])    renal ca  . COPD (chronic obstructive pulmonary disease) ([place])   . Drug-seeking behavior   . Pancreatitis   . Pancreatitis   . Seizures ([place])   . Substance abuse [facility])     Past Surgical History:  Procedure Laterality Date  . IR GENERIC HISTORICAL  05/08/2015   IR RADIOLOGIST EVAL & MGMT 05/08/2015 Aletta Edouard, MD [facility] INTERV RAD  . IR GENERIC HISTORICAL  01/08/2014   IR RADIOLOGIST EVAL & MGMT 01/08/2014 Aletta Edouard, MD [facility] INTERV RAD  . IR RADIOLOGY PERIPHERAL GUIDED IV START  10/03/2017  .  IR RADIOLOGY PERIPHERAL GUIDED IV START  10/10/2017  . IR US GUIDE VASC ACCESS RIGHT  10/05/2017  . IR US GUIDE VASC ACCESS RIGHT  10/10/2017  . KIDNEY SURGERY     removed cancerous lesions  . PARTIAL GASTRECTOMY      Family History  Problem Relation Age of Onset  . CAD Mother   . Alcoholism Father   . COPD Father      Social History   Tobacco Use  Smoking Status Current Every Day Smoker  . Packs/day: 1.50  . Years: 30.00  . Pack years: 45.00  . Types: Cigarettes  Smokeless Tobacco Never Used  Tobacco Comment   smoking up to 1.5 ppd per husband. down from 3pk day to 1.5     Social History   Substance and Sexual Activity  Alcohol Use Not Currently  . Alcohol/week: 0.0 standard drinks   Comment: "I stopped drinking 1 year ago"     Allergies  Allergen Reactions  . Aspirin Other (See Comments)    Break out in welts  . Chlorpromazine Hcl Other (See Comments)    Muscle spasms - "really bad"    Current Outpatient Medications  Medication Sig Dispense Refill  . ALPRAZolam (XANAX) 0.25 MG tablet Take 1 tablet (0.25 mg total) by  mouth 3 (three) times daily as needed for anxiety. (Patient not taking: Reported on 10/06/2017) 12 tablet 0  . amoxicillin-clavulanate (AUGMENTIN) 875-125 MG tablet Take 1 tablet by mouth 2 (two) times daily. One po bid x 7 days (Patient not taking: Reported on 10/10/2017) 14 tablet 0  . busPIRone (BUSPAR) 10 MG tablet Take 10 mg by mouth 3 (three) times daily as needed (anxiety).    . famotidine (PEPCID) 20 MG tablet Take 1 tablet (20 mg total) by mouth daily. (Patient taking differently: Take 20 mg by mouth daily as needed for heartburn or indigestion. ) 30 tablet 0  . hydrOXYzine (ATARAX/VISTARIL) 25 MG tablet Take 1 tablet (25 mg total) by mouth every 6 (six) hours as needed for anxiety. 15 tablet 0  . lidocaine (LIDODERM) 5 % Place 1 patch onto the skin daily. Remove & Discard patch within 12 hours or as directed by MD (Patient not taking: Reported on 10/01/2017) 30 patch 0  . losartan (COZAAR) 50 MG tablet Take 50 mg by mouth daily.    . mirabegron ER (MYRBETRIQ) 25 MG TB24 tablet Take 1 tablet (25 mg total) by mouth daily. (Patient not taking: Reported on 10/01/2017) 30 tablet 0  . mometasone-formoterol (DULERA) 200-5 MCG/ACT AERO Inhale 2 puffs into the lungs 2 (two) times daily. (Patient taking differently: Inhale 3 puffs into the lungs daily. ) 0.1 g 0  . Multiple Vitamin (MULTIVITAMIN) LIQD Take 15 mLs by mouth daily. (Patient not taking: Reported on 10/01/2017) 450 mL 0  . ondansetron (ZOFRAN) 4 MG tablet Take 4 mg by mouth daily as needed for nausea/vomiting.  6  . polyethylene glycol powder (GLYCOLAX/MIRALAX) powder Mix 1 capful of powder into drink and take by mouth 1-3 times daily until daily soft stools OTC (Patient not taking: Reported on 10/01/2017) 225 g 0  . PROAIR HFA 108 (90 Base) MCG/ACT inhaler Inhale 1 puff into the lungs every 4 (four) hours as needed for wheezing or shortness of breath.   5  . SPIRIVA HANDIHALER 18 MCG inhalation capsule PLACE 1 CAPSULE INTO INHALER AND INHALE  DAILY (Patient taking differently: Place 18 mcg into inhaler and inhale daily as needed (shortness of breath). ) 30  capsule 0  . SUBOXONE 8-2 MG FILM Take 1 Film by mouth every 12 (twelve) hours.  0  . tamsulosin (FLOMAX) 0.4 MG CAPS capsule Take 1 capsule (0.4 mg total) by mouth daily. (Patient not taking: Reported on 10/10/2017) 30 capsule 0   No current facility-administered medications for this visit.     Pertinent items are noted in HPI.   Review of Systems:     Cardiac Review of Systems: [Y] = yes  or   [ N ] = no   Chest Pain [ y   ]  Resting SOB [  y ] Exertional SOB  Blue.Reese  ]  Orthopnea Blue.Reese  ]   Pedal Edema [   ]    Palpitations [ hn ] Syncope  [ n ]   Presyncope [ y  ]   General Review of Systems: [Y] = yes [  ]=no Constitional: recent weight change [  ];  Wt loss over the last 3 months [   ] anorexia [  ]; fatigue [  ]; nausea [  ]; night sweats [  ]; fever [  ]; or chills [  ];           Eye : blurred vision [  ]; diplopia [   ]; vision changes [  ];  Amaurosis fugax[  ]; Resp: cough [  ];  wheezing[  ];  hemoptysis[  ]; shortness of breath[  ]; paroxysmal nocturnal dyspnea[  ]; dyspnea on exertion[  ]; or orthopnea[  ];  GI:  gallstones[  ], vomiting[  ];  dysphagia[y  ]; melena[  ];  hematochezia [  ]; heartburn[y  ];   Hx of  Colonoscopy[ n ]; GU: kidney stones [  ]; hematuria[  ];   dysuria Blue.Reese  ];  nocturia[  ];  history of     obstruction [  ]; urinary frequency [  ]             Skin: rash, swelling[  ];, hair loss[  ];  peripheral edema[  ];  or itching[  ]; Musculosketetal: myalgias[  ];  joint swelling[  ];  joint erythema[  ];  joint pain[  ];  back pain[  ];  Heme/Lymph: bruising[  ];  bleeding[  ];  anemia[  ];  Neuro: TIA[  ];  headaches[y  ];  stroke[  ];  vertigo[  ];  seizures[  ];   paresthesias[y  ];  difficulty walkingy[  ];  Psych:depression[ y ]; anxiety[ y ];  Endocrine: diabetes[  ];  thyroid dysfunction[  ];  Immunizations: Flu up to date Blue.Reese  ]; Pneumococcal  up to date [ n ];  Other:     PHYSICAL EXAMINATION: BP (!) 82/54 (BP Location: Left Arm, Patient Position: Sitting) Comment (Cuff Size): palpated  Pulse (!) 120   Resp 18   Ht 5\' 4"  (1.626 m)   Wt 69 lb 3.2 oz (31.4 kg)   BMI 11.88 kg/m  General appearance: alert, appears older than stated age, cachectic, fatigued, mild distress and pale Head: Normocephalic, without obvious abnormality, atraumatic Neck: no adenopathy, no carotid bruit, no JVD, supple, symmetrical, trachea midline and thyroid not enlarged, symmetric, no tenderness/mass/nodules Lymph nodes: Cervical, supraclavicular, and axillary nodes normal. Resp: diminished breath sounds bilaterally Back: symmetric, no curvature. ROM normal. No CVA tenderness. Cardio: regular rate and rhythm, S1, S2 normal, no murmur, click, rub or gallop GI: soft, non-tender; bowel sounds normal; no masses,  no organomegaly Extremities: extremities normal, atraumatic, no cyanosis or edema Neurologic: Grossly normal   Diagnostic Studies & Laboratory data:     Recent Radiology Findings:   Dg Chest 2 View  Result Date: 09/17/2017 CLINICAL DATA:  Shortness of breath EXAM: CHEST - 2 VIEW COMPARISON:  09/15/2017 FINDINGS: Cardiac shadow is stable. A retrocardiac mass lesion is again identified similar to that seen on the prior exam on the left. Patchy infiltrate is again noted in the right lung base stable from the previous exam. No new focal abnormality is noted. No bony abnormality is seen. IMPRESSION: Stable right lower lobe infiltrate. Stable left lower lobe mass. Electronically Signed   By: Inez Catalina M.D.   On: 09/17/2017 14:22     Ct Chest W Contrast  Result Date: 09/18/2017 CLINICAL DATA:  LEFT lower lobe pulmonary mass. Rib pain. History of LEFT renal clear cell carcinoma in 2009, status post cryoablation of cancer recurrence in 2012. EXAM: CT CHEST WITH CONTRAST TECHNIQUE: Multidetector CT imaging of the chest was performed during  intravenous contrast administration. CONTRAST:  5mL OMNIPAQUE IOHEXOL 300 MG/ML  SOLN COMPARISON:  CT chest dated 01/15/2017. Chest x-ray dated 09/17/2017. FINDINGS: Cardiovascular: No thoracic aortic aneurysm or evidence of aortic dissection. Aortic atherosclerosis. Heart size is normal. Diffuse coronary artery calcifications. Mediastinum/Nodes: Scattered small lymph nodes within the mediastinum and perihilar regions. No mass or enlarged lymph nodes seen. Esophagus is unremarkable. Small amount of debris within the trachea and central bronchi. Lungs/Pleura: Mass within the medial aspects of the LEFT lower lobe measures 3.1 x 2.8 cm, corresponding to findings on recent chest x-rays, new compared to the earlier chest CT of 01/15/2017, highly suggestive of neoplastic mass. Patchy consolidations within the lower lobes bilaterally, dependent aspects, new compared to the earlier chest CT, stable or increasing compared to recent chest x-rays. Small bilateral pleural effusions. Mild emphysematous changes at the lung apices. Upper Abdomen: No acute findings on limited images of the upper abdomen. Musculoskeletal: New compression fracture deformity involving the superior endplate of the T7 vertebral body, likely acute to subacute, suspected pathologic fracture. No other acute or suspicious osseous finding. IMPRESSION: 1. LEFT lower lobe mass measures 3.1 cm, almost certainly neoplastic. This could represent primary bronchogenic carcinoma or metastatic disease. Recommend tissue sampling. 2. Patchy consolidations at the bilateral lung bases, most likely pneumonia and/or aspiration. 3. Small bilateral pleural effusions. 4. New compression fracture deformity involving the superior endplate of the T7 vertebral body, approximately 30% compressed anteriorly and centrally, acute to subacute in age, suspected pathologic fracture. No associated vertebral body retropulsion or displacement. 5. Diffuse coronary artery calcifications.  Aortic Atherosclerosis (ICD10-I70.0) and Emphysema (ICD10-J43.9). Electronically Signed   By: Franki Cabot M.D.   On: 09/18/2017 12:34   Mr Thoracic Spine Wo Contrast  Result Date: 09/19/2017 CLINICAL DATA:  Evaluate thoracic compression deformity. Patient has a chest mass. EXAM: MRI THORACIC SPINE WITHOUT CONTRAST TECHNIQUE: Multiplanar, multisequence MR imaging of the thoracic spine was performed. No intravenous contrast was administered. COMPARISON:  Chest CT scans 09/18/2017 and 01/15/2017. FINDINGS: Study was ordered without contrast. Sensitivity in the detection of metastatic disease is reduced. Alignment: Physiologic. Vertebrae: There is a mild superior endplate deformity of T7. There is no clear destructive process. There does not appear to be significant bone marrow edema. There is no significant retropulsion. While this deformity is new compared with prior chest CT from 01/15/2017, it does not clearly represent metastatic disease. This abnormality is favored to represent a late subacute to chronic  compression fracture, related to osteopenia. Cord:  Normal signal and morphology.  No intraspinal mass lesion. Paraspinal and other soft tissues: The LEFT paravertebral mass is silhouetted by large BILATERAL pleural effusions, and bibasilar edema or consolidation, both of which are increased since yesterday's CT. Disc levels: There is no significant disc protrusion or spinal stenosis. No intraspinal mass lesion is evident on this noncontrast scan. IMPRESSION: Mild superior endplate deformity of T7, without significant bone marrow edema, retropulsion, or marrow replacement. This abnormality is favored to represent a late subacute to chronic compression fracture, likely osteopenic in nature. Worsening BILATERAL pleural effusions and bibasilar consolidation/edema. Electronically Signed   By: Staci Righter M.D.   On: 09/19/2017 16:29   Nm Pet Image Initial (pi) Skull Base To Thigh  Result Date:  10/03/2017 CLINICAL DATA:  Initial treatment strategy for right lung mass. EXAM: NUCLEAR MEDICINE PET SKULL BASE TO THIGH TECHNIQUE: 4.4 mCi F-18 FDG was injected intravenously. Full-ring PET imaging was performed from the skull base to thigh after the radiotracer. CT data was obtained and used for attenuation correction and anatomic localization. Fasting blood glucose: 73 mg/dl COMPARISON:  Multiple exams, including 09/18/2017 FINDINGS: Mediastinal blood pool activity: SUV max 0.5 NECK: High muscular activity in the pterygoid muscles and diffusely throughout the tongue without CT correlate, likely physiologic. Potential symmetric accentuated activity in the tonsils. There is misregistration between the CT and PET data in the head/neck region. Incidental CT findings: Bilateral carotid atherosclerotic calcification. Severe paucity of adipose tissue. CHEST: 3.0 by 2.8 cm left lower lobe pleural-based mass posteromedially has maximum SUV of 8.0 and central low activity compatible with central necrosis. There is additional faint nodularity in both lower lobes with low-grade associated metabolic activity. For example, a 0.9 by 0.4 cm nodule in the right lower lobe on image 37/8 has a maximum SUV of 0.8 and a subpleural band of airspace opacity in the right lower lobe measuring 2.3 by 1.2 cm has a maximum SUV of 1.4. There is additional scattered indistinctly marginated lower lobe nodularity with small irregular nodules of varying size favoring the lower lobes. There is also some atelectasis or bandlike density below the level of the left lower lobe mass. Mild biapical pleuroparenchymal scarring. Incidental CT findings: Lower lobe consolidation is generally improved from 09/18/2017, and aside from the left lower lobe mass, much of the nodularity and remaining opacity is probably inflammatory based on morphology and appearance. Coronary, aortic arch, and branch vessel atherosclerotic vascular disease. Emphysema noted.  ABDOMEN/PELVIS: Left kidney lower pole ablation defect with peripheral rim calcification but no current hypermetabolic activity. Incidental CT findings: Aortoiliac atherosclerotic vascular disease. SKELETON: No focal hypermetabolic activity to suggest skeletal metastasis. Incidental CT findings: Bilateral hip screws. IMPRESSION: 1. 3.0 cm left lower lobe mass is hypermetabolic with maximum SUV of 8.0, compatible with malignancy. This mass has central necrosis. 2. Improved by lateral airspace opacities with bibasilar nodularity and bandlike densities. These have relatively low-level metabolic activity, maximum SUV up to 1.4, and are most likely inflammatory given the improving response., thought to be physiologic. High activity in the tongue and some neck musculature 3. Other imaging findings of potential clinical significance: Aortic Atherosclerosis (ICD10-I70.0) and Emphysema (ICD10-J43.9). Coronary atherosclerosis. Stable appearance of left kidney lower pole ablation defect with peripheral rim calcification but no current hypermetabolic activity. Electronically Signed   By: Van Clines M.D.   On: 10/03/2017 13:15   I   Ct Biopsy  Result Date: 10/10/2017 INDICATION: Left lower lobe lung mass EXAM: CT  BIOPSY MEDICATIONS: None. ANESTHESIA/SEDATION: Fentanyl 100 mcg IV; Versed 2 mg IV Moderate Sedation Time:  12 minutes The patient was continuously monitored during the procedure by the interventional radiology nurse under my direct supervision. FLUOROSCOPY TIME:  Fluoroscopy Time:  minutes  seconds ( mGy). COMPLICATIONS: None immediate. PROCEDURE: Informed written consent was obtained from the patient after a thorough discussion of the procedural risks, benefits and alternatives. All questions were addressed. Maximal Sterile Barrier Technique was utilized including caps, mask, sterile gowns, sterile gloves, sterile drape, hand hygiene and skin antiseptic. A timeout was performed prior to the initiation  of the procedure. Under CT guidance, a(n) 17 gauge guide needle was advanced into the left lower lobe lung mass. Subsequently 3 18 gauge core biopsies were obtained. The guide needle was removed. Post biopsy images demonstrate no hemorrhage or pneumothorax. Patient tolerated the procedure well without complication. Vital sign monitoring by nursing staff during the procedure will continue as patient is in the special procedures unit for post procedure observation. FINDINGS: The images document guide needle placement within the left lower lobe lung mass. Post biopsy images demonstrate no pneumothorax. A tiny amount of gas in the inter costal space is noted. IMPRESSION: Successful CT-guided left lower lobe lung mass core biopsy. Electronically Signed   By: Marybelle Killings M.D.   On: 10/10/2017 15:59   Dg Chest Port 1 View  Result Date: 10/10/2017 CLINICAL DATA:  Post biopsy EXAM: PORTABLE CHEST 1 VIEW COMPARISON:  10/06/2017 FINDINGS: There is no pneumothorax status post left lung biopsy. Lungs remain hyperaerated. Left lower lobe mass is noted. Heart is normal in size. IMPRESSION: No pneumothorax post left lung biopsy. Electronically Signed   By: Marybelle Killings M.D.   On: 10/10/2017 14:18   Dg Chest Portable 1 View  Result Date: 10/06/2017 CLINICAL DATA:  Shortness of breath EXAM: PORTABLE CHEST 1 VIEW COMPARISON:  10/03/2017 FINDINGS: Cardiac shadow is within normal limits. Aortic calcifications are noted. The lungs are hyperinflated consistent with COPD. Patient's known left lower lobe mass lesion is again noted adjacent to the spine in a retrocardiac position. The known nodular changes in the bases are less well appreciated than on the prior exam. The band like density in the right base is again noted and stable. No bony abnormality is seen. IMPRESSION: Stable left lower lobe mass lesion and right basilar scarring. COPD. Electronically Signed   By: Inez Catalina M.D.   On: 10/06/2017 11:39    Ir Radiology  Peripheral Guided Iv Start  Result Date: 10/10/2017 INDICATION: Poor IV access EXAM: VENA PUNCTURE BY MD MEDICATIONS: None ANESTHESIA/SEDATION: None FLUOROSCOPY TIME:  None COMPLICATIONS: None immediate. PROCEDURE: Informed written consent was obtained from the patient after a thorough discussion of the procedural risks, benefits and alternatives. All questions were addressed. Maximal Sterile Barrier Technique was utilized including caps, mask, sterile gowns, sterile gloves, sterile drape, hand hygiene and skin antiseptic. A timeout was performed prior to the initiation of the procedure. The right arm was prepped and draped in a sterile fashion. 1% lidocaine was utilized for local anesthesia. Under sonographic guidance, a micropuncture needle was inserted into the right basilic vein and removed over a 018 wire. The basilic vein was noted to be patent by sonography and documentation by sonography was performed. A 5 French transitional dilator was advanced over the wire. It was flushed and secured in place. IMPRESSION: Successful vena puncture by MD. Electronically Signed   By: Marybelle Killings M.D.   On: 10/10/2017 14:31  I   I have independently reviewed the above radiology studies  and reviewed the findings with the patient.   Recent Lab Findings: Lab Results  Component Value Date   WBC 9.5 10/10/2017   HGB 9.8 (L) 10/10/2017   HCT 31.3 (L) 10/10/2017   PLT 275 10/10/2017   GLUCOSE 121 (H) 10/06/2017   CHOL  02/19/2009    139        ATP III CLASSIFICATION:  <200     mg/dL   Desirable  200-239  mg/dL   Borderline High  >=240    mg/dL   High          TRIG 75 02/19/2009   HDL 51 02/19/2009   LDLCALC  02/19/2009    73        Total Cholesterol/HDL:CHD Risk Coronary Heart Disease Risk Table                     Men   Women  1/2 Average Risk   3.4   3.3  Average Risk       5.0   4.4  2 X Average Risk   9.6   7.1  3 X Average Risk  23.4   11.0        Use the calculated Patient Ratio above  and the CHD Risk Table to determine the patient's CHD Risk.        ATP III CLASSIFICATION (LDL):  <100     mg/dL   Optimal  100-129  mg/dL   Near or Above                    Optimal  130-159  mg/dL   Borderline  160-189  mg/dL   High  >190     mg/dL   Very High   ALT 11 10/01/2017   AST 16 10/01/2017   NA 139 10/06/2017   K 5.0 10/06/2017   CL 103 10/06/2017   CREATININE 0.78 10/06/2017   BUN 10 10/06/2017   CO2 27 10/06/2017   TSH 1.799 03/10/2013   INR 0.94 10/01/2017   HGBA1C 5.5 03/15/2017   PFT- 07/2014 FEV1 .765 29% DLCO 12.69 49% Pulmonary Function Diagnosis: Severe Obstructive Airways Disease with minimal reversiblity  Assessment / Plan:   Severe cachexia-protein malnutrition 3 cm left lung mass-preliminary interpretation adenocarcinoma possible originating from the colon, not renal cell History of renal cell carcinoma Severe COPD long-standing  After reviewing the patient's history scans seen her and discussing her current functional status with her and her husband the patient is definitely not a candidate to consider any surgical intervention on her lung.  She is to be seen by radiation oncology tomorrow.  Likely will need to wait until the final pathology is determine before treatment plan is decided upon.   She and her husband had their questions answered.  I  spent 40 minutes with  the patient face to face and greater then 50% of the time was spent in counseling and coordination of care.    Grace Isaac MD      [address] ,[place] [address] Office [phone]   Beeper [phone]  10/11/2017 4:01 PM

## 2017-10-11 NOTE — Telephone Encounter (Signed)
Patient needs a order for portal oxygen sent to [facility].

## 2017-10-12 ENCOUNTER — Ambulatory Visit
Admission: RE | Admit: 2017-10-12 | Discharge: 2017-10-12 | Disposition: A | Discharge status: Home / Self Care | Payer: Medicaid Other | Source: Ambulatory Visit | Attending: Radiation Oncology | Admitting: Radiation Oncology

## 2017-10-12 ENCOUNTER — Encounter: Payer: Self-pay | Admitting: Radiation Oncology

## 2017-10-12 ENCOUNTER — Encounter: Payer: Self-pay | Admitting: Family Medicine

## 2017-10-12 ENCOUNTER — Other Ambulatory Visit: Payer: Self-pay

## 2017-10-12 VITALS — BP 113/79 | HR 90 | Temp 98.2°F | Resp 18 | Ht 64.0 in | Wt <= 1120 oz

## 2017-10-12 DIAGNOSIS — E43 Unspecified severe protein-calorie malnutrition: Secondary | ICD-10-CM

## 2017-10-12 DIAGNOSIS — F17210 Nicotine dependence, cigarettes, uncomplicated: Secondary | ICD-10-CM | POA: Insufficient documentation

## 2017-10-12 DIAGNOSIS — E46 Unspecified protein-calorie malnutrition: Secondary | ICD-10-CM | POA: Insufficient documentation

## 2017-10-12 DIAGNOSIS — R918 Other nonspecific abnormal finding of lung field: Secondary | ICD-10-CM | POA: Insufficient documentation

## 2017-10-12 DIAGNOSIS — C3432 Malignant neoplasm of lower lobe, left bronchus or lung: Secondary | ICD-10-CM

## 2017-10-12 DIAGNOSIS — Z886 Allergy status to analgesic agent status: Secondary | ICD-10-CM | POA: Insufficient documentation

## 2017-10-12 DIAGNOSIS — F419 Anxiety disorder, unspecified: Secondary | ICD-10-CM | POA: Diagnosis not present

## 2017-10-12 DIAGNOSIS — J449 Chronic obstructive pulmonary disease, unspecified: Secondary | ICD-10-CM | POA: Insufficient documentation

## 2017-10-12 DIAGNOSIS — C649 Malignant neoplasm of unspecified kidney, except renal pelvis: Secondary | ICD-10-CM | POA: Insufficient documentation

## 2017-10-12 DIAGNOSIS — Z79899 Other long term (current) drug therapy: Secondary | ICD-10-CM | POA: Diagnosis not present

## 2017-10-12 DIAGNOSIS — F411 Generalized anxiety disorder: Secondary | ICD-10-CM

## 2017-10-12 NOTE — Progress Notes (Signed)
Radiation Oncology         [phone] ________________________________  Name: [name]        MRN: [id number]  Date of Service: 10/12/2017 DOB: [birth year]  HY:[id number], Janice Lunch, FNP  Curcio, Roselie Awkward, NP     REFERRING PHYSICIAN: Maryanna Shape, NP   DIAGNOSIS: The primary encounter diagnosis was Malignant neoplasm of kidney excluding renal pelvis, unspecified laterality ([place]). A diagnosis of Lung mass was also pertinent to this visit.   HISTORY OF PRESENT ILLNESS: [name] is a 67 y.o. female seen at the request of Dr. 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 She underwent radiofrequency ablation in 2009 and cryoablation in 2012 with Dr. Kathlene Cote. She has had local control since that time, but has been followed but has also had a polyp 17 visit to the emergency room with COPD exacerbations, pneumonia, weakness and anxiety.  A CT scan of the chest during 1 of her hospital evaluations revealed a 3 cm left lower lobe mass that had not been seen previously in January 2019.  A PET scan was performed on 10/03/2017 which revealed muscular anterior activity in the pterygoid muscles diffusely throughout the tongue, likely physiologic, and potential symmetric activity in the tonsils.  There was a left lower lobe mass measuring 3 x 2.8 cm with an SUV of 8, and faint nodularity in both lower lobes.  In the right lower lobe at 9 x 4 mm area had an SUV of 0.8 and a subpleural band of airspace opacity in the right lower lobe measuring 2.3 x 1.2 cm had an SUV of 1.4. There was prior ablation defect in the lower pole of the left kidney consistent with prior treatment.  No hypermetabolism was noted there.  Atherosclerotic and emphysematous changes were also noted.  No other area of hypermetabolism was identified.  A biopsy under CT guidance of her left lower  lobe lesion on 10/10/2017 was performed.  Final pathology is still pending but in discussing her case with Dr. Tammi Klippel, this appears to be an adenocarcinoma, stains are showing concern for colorectal primary.  She comes today to discuss options of treatment recommendations.    PREVIOUS RADIATION THERAPY: No   PAST MEDICAL HISTORY:  Past Medical History:  Diagnosis Date  . Cancer ([place])    renal ca  . COPD (chronic obstructive pulmonary disease) ([place])   . Drug-seeking behavior   . Pancreatitis   . Pancreatitis   . Seizures ([place])   . Substance abuse ([place])        PAST SURGICAL HISTORY: Past Surgical History:  Procedure Laterality Date  . IR GENERIC HISTORICAL  05/08/2015   IR RADIOLOGIST EVAL & MGMT 05/08/2015 Aletta Edouard, MD [facility] INTERV RAD  . IR GENERIC HISTORICAL  01/08/2014   IR RADIOLOGIST EVAL & MGMT 01/08/2014 Aletta Edouard, MD [facility] INTERV RAD  . IR RADIOLOGY PERIPHERAL GUIDED IV START  10/03/2017  . IR RADIOLOGY PERIPHERAL GUIDED IV START  10/10/2017  . IR US GUIDE VASC ACCESS RIGHT  10/05/2017  . IR US GUIDE VASC ACCESS RIGHT  10/10/2017  . KIDNEY SURGERY     removed cancerous lesions  . PARTIAL GASTRECTOMY       FAMILY HISTORY:  Family History  Problem Relation Age of Onset  . CAD Mother   . Alcoholism Father   .  COPD Father      SOCIAL HISTORY:  reports that she has been smoking cigarettes. She has a 45.00 pack-year smoking history. She has never used smokeless tobacco. She reports that she drank alcohol. She reports that she does not use drugs.  The patient previously drank heavily.  She has been sober for the last year.   ALLERGIES: Aspirin and Chlorpromazine hcl   MEDICATIONS:  Current Outpatient Medications  Medication Sig Dispense Refill  . ALPRAZolam (XANAX) 0.25 MG tablet Take 1 tablet (0.25 mg total) by mouth 3 (three) times daily as needed for anxiety. (Patient not taking: Reported on 10/06/2017) 12 tablet 0  . amoxicillin-clavulanate (AUGMENTIN)  875-125 MG tablet Take 1 tablet by mouth 2 (two) times daily. One po bid x 7 days (Patient not taking: Reported on 10/10/2017) 14 tablet 0  . busPIRone (BUSPAR) 10 MG tablet Take 10 mg by mouth 3 (three) times daily as needed (anxiety).    . famotidine (PEPCID) 20 MG tablet Take 1 tablet (20 mg total) by mouth daily. (Patient taking differently: Take 20 mg by mouth daily as needed for heartburn or indigestion. ) 30 tablet 0  . hydrOXYzine (ATARAX/VISTARIL) 25 MG tablet Take 1 tablet (25 mg total) by mouth every 6 (six) hours as needed for anxiety. 15 tablet 0  . lidocaine (LIDODERM) 5 % Place 1 patch onto the skin daily. Remove & Discard patch within 12 hours or as directed by MD (Patient not taking: Reported on 10/01/2017) 30 patch 0  . losartan (COZAAR) 50 MG tablet Take 50 mg by mouth daily.    . mirabegron ER (MYRBETRIQ) 25 MG TB24 tablet Take 1 tablet (25 mg total) by mouth daily. (Patient not taking: Reported on 10/01/2017) 30 tablet 0  . mometasone-formoterol (DULERA) 200-5 MCG/ACT AERO Inhale 2 puffs into the lungs 2 (two) times daily. (Patient taking differently: Inhale 3 puffs into the lungs daily. ) 0.1 g 0  . Multiple Vitamin (MULTIVITAMIN) LIQD Take 15 mLs by mouth daily. (Patient not taking: Reported on 10/01/2017) 450 mL 0  . ondansetron (ZOFRAN) 4 MG tablet Take 4 mg by mouth daily as needed for nausea/vomiting.  6  . polyethylene glycol powder (GLYCOLAX/MIRALAX) powder Mix 1 capful of powder into drink and take by mouth 1-3 times daily until daily soft stools OTC (Patient not taking: Reported on 10/01/2017) 225 g 0  . PROAIR HFA 108 (90 Base) MCG/ACT inhaler Inhale 1 puff into the lungs every 4 (four) hours as needed for wheezing or shortness of breath.   5  . SPIRIVA HANDIHALER 18 MCG inhalation capsule PLACE 1 CAPSULE INTO INHALER AND INHALE DAILY (Patient taking differently: Place 18 mcg into inhaler and inhale daily as needed (shortness of breath). ) 30 capsule 0  . SUBOXONE 8-2 MG FILM  Take 1 Film by mouth every 12 (twelve) hours.  0  . tamsulosin (FLOMAX) 0.4 MG CAPS capsule Take 1 capsule (0.4 mg total) by mouth daily. (Patient not taking: Reported on 10/10/2017) 30 capsule 0   No current facility-administered medications for this encounter.      REVIEW OF SYSTEMS: On review of systems, the patient reports that in reviewing her previous assessments, she has been seen on multiple occasions for anxiety, which she admits has become very troublesome for her.  Her husband requests management of this from our clinic, it is notable that other providers have declined, and she is seen in a pain management setting where she receives Suboxone.  The patient's husband is  quick to say that the providers are not willing to prescribe medication for her anxiety, and that she has been in the hospital in behavioral health multiple times.  In reviewing her weight, and the last 3 years the most she is ever weighed was 89.9.  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  She has weighed less than 80 pounds, or just about that for the last year however it appears that she has lost approximately 10 pounds since the summer.  She denies any current alcohol use or recreational drug use.  She recalls prior colonoscopy screening, however this is not seen in the chart.  She denies any blood per rectum, but states that she has been having episodes of diarrhea intermittently for months.  She denies any melena, or hematochezia.  She denies any nausea or vomiting. She continues to take pain medication as outlined above, but does not verbalize any other new musculoskeletal or joint aches or pains. A complete review of systems is obtained and is otherwise negative.     PHYSICAL EXAM:  Wt Readings from Last 3 Encounters:  10/11/17 69 lb 3.2 oz (31.4 kg)  10/10/17 75 lb (34 kg)  10/04/17 72 lb 4.8 oz (32.8 kg)    Temp Readings from Last 3 Encounters:  10/10/17 98.2 F (36.8 C)  10/06/17 98.4 F (36.9 C) (Oral)  10/04/17 98.3 F (36.8 C) (Oral)   BP Readings from Last 3 Encounters:  10/11/17 (!) 82/54  10/10/17 108/69  10/10/17 (!) 140/96   Pulse Readings from Last 3 Encounters:  10/11/17 (!) 120  10/10/17 93  10/10/17 93    In general this is a cachectic appearing chronically ill Caucasian female in no acute distress. She is alert and oriented x4 and appropriate throughout the examination. HEENT reveals that the patient is normocephalic, atraumatic. EOMs are intact. Skin is intact without any evidence of gross lesions. Cardiopulmonary assessment is negative for acute distress and she exhibits normal effort.    ECOG = 3 0 - Asymptomatic (Fully active, able to carry on all predisease activities without restriction)  1 - Symptomatic but completely ambulatory (Restricted in physically strenuous activity but ambulatory and able to carry out work of a light or sedentary nature. For example, light housework, office work)  2 - Symptomatic, <50% in bed during the day (Ambulatory and capable of all self care but unable to carry out any work activities. Up and about more than 50% of waking hours)  3 - Symptomatic, >50% in bed, but not bedbound (Capable of only limited self-care, confined to bed or chair 50% or more of waking hours)  4 - Bedbound (Completely disabled. Cannot carry on any self-care. Totally confined to bed or chair)  5 - Death   Eustace Pen MM, Creech RH, Tormey DC, et al. [phone]). "Toxicity and response criteria of the [facility]". [place] Oncol. 5 (6): 649-55    LABORATORY DATA:  Lab Results  Component Value Date   WBC 9.5 10/10/2017   HGB 9.8 (L) 10/10/2017   HCT 31.3 (L) 10/10/2017   MCV 90.7 10/10/2017   PLT 275 10/10/2017   Lab Results  Component Value Date   NA 139 10/06/2017   K 5.0 10/06/2017   CL 103 10/06/2017   CO2 27 10/06/2017    Lab Results  Component Value Date   ALT 11 10/01/2017  AST 16 10/01/2017   ALKPHOS 69 10/01/2017   BILITOT 0.4 10/01/2017      RADIOGRAPHY: Dg Chest 2 View  Result Date: 09/17/2017 CLINICAL DATA:  Shortness of breath EXAM: CHEST - 2 VIEW COMPARISON:  09/15/2017 FINDINGS: Cardiac shadow is stable. A retrocardiac mass lesion is again identified similar to that seen on the prior exam on the left. Patchy infiltrate is again noted in the right lung base stable from the previous exam. No new focal abnormality is noted. No bony abnormality is seen. IMPRESSION: Stable right lower lobe infiltrate. Stable left lower lobe mass. Electronically Signed   By: Inez Catalina M.D.   On: 09/17/2017 14:22   Dg Chest 2 View  Result Date: 09/15/2017 CLINICAL DATA:  Pt reports being unable to urinate since last night. Pt hx of COPD and normally on 4L. Pt is SOB and O2 sat is 86%. Smoker. EXAM: CHEST - 2 VIEW COMPARISON:  09/04/2017 FINDINGS: Lungs are markedly hyperinflated. The heart size is normal. In the LEFT LOWER lobe, oval mass is estimated to measure 4.1 x 3.0 centimeters, larger compared to prior studies. There is patchy opacity in the RIGHT lung base, consistent with infectious abnormality. No pulmonary edema. IMPRESSION: Increasing size of LEFT LOWER lobe mass. RIGHT LOWER lobe infiltrate. Electronically Signed   By: Nolon Nations M.D.   On: 09/15/2017 16:30   Ct Chest W Contrast  Result Date: 09/18/2017 CLINICAL DATA:  LEFT lower lobe pulmonary mass. Rib pain. History of LEFT renal clear cell carcinoma in 2009, status post cryoablation of cancer recurrence in 2012. EXAM: CT CHEST WITH CONTRAST TECHNIQUE: Multidetector CT imaging of the chest was performed during intravenous contrast administration. CONTRAST:  56mL OMNIPAQUE IOHEXOL 300 MG/ML  SOLN COMPARISON:  CT chest dated 01/15/2017. Chest x-ray dated 09/17/2017. FINDINGS: Cardiovascular: No thoracic aortic aneurysm or evidence of aortic dissection.  Aortic atherosclerosis. Heart size is normal. Diffuse coronary artery calcifications. Mediastinum/Nodes: Scattered small lymph nodes within the mediastinum and perihilar regions. No mass or enlarged lymph nodes seen. Esophagus is unremarkable. Small amount of debris within the trachea and central bronchi. Lungs/Pleura: Mass within the medial aspects of the LEFT lower lobe measures 3.1 x 2.8 cm, corresponding to findings on recent chest x-rays, new compared to the earlier chest CT of 01/15/2017, highly suggestive of neoplastic mass. Patchy consolidations within the lower lobes bilaterally, dependent aspects, new compared to the earlier chest CT, stable or increasing compared to recent chest x-rays. Small bilateral pleural effusions. Mild emphysematous changes at the lung apices. Upper Abdomen: No acute findings on limited images of the upper abdomen. Musculoskeletal: New compression fracture deformity involving the superior endplate of the T7 vertebral body, likely acute to subacute, suspected pathologic fracture. No other acute or suspicious osseous finding. IMPRESSION: 1. LEFT lower lobe mass measures 3.1 cm, almost certainly neoplastic. This could represent primary bronchogenic carcinoma or metastatic disease. Recommend tissue sampling. 2. Patchy consolidations at the bilateral lung bases, most likely pneumonia and/or aspiration. 3. Small bilateral pleural effusions. 4. New compression fracture deformity involving the superior endplate of the T7 vertebral body, approximately 30% compressed anteriorly and centrally, acute to subacute in age, suspected pathologic fracture. No associated vertebral body retropulsion or displacement. 5. Diffuse coronary artery calcifications. Aortic Atherosclerosis (ICD10-I70.0) and Emphysema (ICD10-J43.9). Electronically Signed   By: Franki Cabot M.D.   On: 09/18/2017 12:34   Mr Thoracic Spine Wo Contrast  Result Date: 09/19/2017 CLINICAL DATA:  Evaluate thoracic compression  deformity. Patient has a chest mass. EXAM:  MRI THORACIC SPINE WITHOUT CONTRAST TECHNIQUE: Multiplanar, multisequence MR imaging of the thoracic spine was performed. No intravenous contrast was administered. COMPARISON:  Chest CT scans 09/18/2017 and 01/15/2017. FINDINGS: Study was ordered without contrast. Sensitivity in the detection of metastatic disease is reduced. Alignment: Physiologic. Vertebrae: There is a mild superior endplate deformity of T7. There is no clear destructive process. There does not appear to be significant bone marrow edema. There is no significant retropulsion. While this deformity is new compared with prior chest CT from 01/15/2017, it does not clearly represent metastatic disease. This abnormality is favored to represent a late subacute to chronic compression fracture, related to osteopenia. Cord:  Normal signal and morphology.  No intraspinal mass lesion. Paraspinal and other soft tissues: The LEFT paravertebral mass is silhouetted by large BILATERAL pleural effusions, and bibasilar edema or consolidation, both of which are increased since yesterday's CT. Disc levels: There is no significant disc protrusion or spinal stenosis. No intraspinal mass lesion is evident on this noncontrast scan. IMPRESSION: Mild superior endplate deformity of T7, without significant bone marrow edema, retropulsion, or marrow replacement. This abnormality is favored to represent a late subacute to chronic compression fracture, likely osteopenic in nature. Worsening BILATERAL pleural effusions and bibasilar consolidation/edema. Electronically Signed   By: Staci Righter M.D.   On: 09/19/2017 16:29   Nm Pet Image Initial (pi) Skull Base To Thigh  Result Date: 10/03/2017 CLINICAL DATA:  Initial treatment strategy for right lung mass. EXAM: NUCLEAR MEDICINE PET SKULL BASE TO THIGH TECHNIQUE: 4.4 mCi F-18 FDG was injected intravenously. Full-ring PET imaging was performed from the skull base to thigh after the  radiotracer. CT data was obtained and used for attenuation correction and anatomic localization. Fasting blood glucose: 73 mg/dl COMPARISON:  Multiple exams, including 09/18/2017 FINDINGS: Mediastinal blood pool activity: SUV max 0.5 NECK: High muscular activity in the pterygoid muscles and diffusely throughout the tongue without CT correlate, likely physiologic. Potential symmetric accentuated activity in the tonsils. There is misregistration between the CT and PET data in the head/neck region. Incidental CT findings: Bilateral carotid atherosclerotic calcification. Severe paucity of adipose tissue. CHEST: 3.0 by 2.8 cm left lower lobe pleural-based mass posteromedially has maximum SUV of 8.0 and central low activity compatible with central necrosis. There is additional faint nodularity in both lower lobes with low-grade associated metabolic activity. For example, a 0.9 by 0.4 cm nodule in the right lower lobe on image 37/8 has a maximum SUV of 0.8 and a subpleural band of airspace opacity in the right lower lobe measuring 2.3 by 1.2 cm has a maximum SUV of 1.4. There is additional scattered indistinctly marginated lower lobe nodularity with small irregular nodules of varying size favoring the lower lobes. There is also some atelectasis or bandlike density below the level of the left lower lobe mass. Mild biapical pleuroparenchymal scarring. Incidental CT findings: Lower lobe consolidation is generally improved from 09/18/2017, and aside from the left lower lobe mass, much of the nodularity and remaining opacity is probably inflammatory based on morphology and appearance. Coronary, aortic arch, and branch vessel atherosclerotic vascular disease. Emphysema noted. ABDOMEN/PELVIS: Left kidney lower pole ablation defect with peripheral rim calcification but no current hypermetabolic activity. Incidental CT findings: Aortoiliac atherosclerotic vascular disease. SKELETON: No focal hypermetabolic activity to suggest  skeletal metastasis. Incidental CT findings: Bilateral hip screws. IMPRESSION: 1. 3.0 cm left lower lobe mass is hypermetabolic with maximum SUV of 8.0, compatible with malignancy. This mass has central necrosis. 2. Improved by lateral airspace  opacities with bibasilar nodularity and bandlike densities. These have relatively low-level metabolic activity, maximum SUV up to 1.4, and are most likely inflammatory given the improving response., thought to be physiologic. High activity in the tongue and some neck musculature 3. Other imaging findings of potential clinical significance: Aortic Atherosclerosis (ICD10-I70.0) and Emphysema (ICD10-J43.9). Coronary atherosclerosis. Stable appearance of left kidney lower pole ablation defect with peripheral rim calcification but no current hypermetabolic activity. Electronically Signed   By: Van Clines M.D.   On: 10/03/2017 13:15   Ir US Guide Vasc Access Right  Result Date: 10/10/2017 INDICATION: Poor IV access EXAM: VENA PUNCTURE BY MD MEDICATIONS: None ANESTHESIA/SEDATION: None FLUOROSCOPY TIME:  None COMPLICATIONS: None immediate. PROCEDURE: Informed written consent was obtained from the patient after a thorough discussion of the procedural risks, benefits and alternatives. All questions were addressed. Maximal Sterile Barrier Technique was utilized including caps, mask, sterile gowns, sterile gloves, sterile drape, hand hygiene and skin antiseptic. A timeout was performed prior to the initiation of the procedure. The right arm was prepped and draped in a sterile fashion. 1% lidocaine was utilized for local anesthesia. Under sonographic guidance, a micropuncture needle was inserted into the right basilic vein and removed over a 018 wire. The basilic vein was noted to be patent by sonography and documentation by sonography was performed. A 5 French transitional dilator was advanced over the wire. It was flushed and secured in place. IMPRESSION: Successful vena  puncture by MD. Electronically Signed   By: Marybelle Killings M.D.   On: 10/10/2017 14:31   Ir US Guide Vasc Access Right  Result Date: 10/05/2017 CLINICAL DATA:  Difficult peripheral IV access. Lung nodule. Needs access for PET-CT.  EXAM: IR RADIOLOGY PERIPHERAL GUIDED IV START  ANESTHESIA/SEDATION: None  MEDICATIONS: Lidocaine 1% subcutaneous  PROCEDURE: The procedure, risks (including but not limited to bleeding, infection, organ damage ), benefits, and alternatives were explained to the patient. Questions regarding the procedure were encouraged and answered. The patient understands and consents to the procedure.  Patency of the right brachial vein confirmed by ultrasound and documented. Overlying skin prepped with chlorhexidine, draped in usual sterile fashion. Maximal barrier sterile technique was utilized including caps, mask, sterile gowns, sterile gloves, sterile drape, hand hygiene and skin antiseptic. Under real-time ultrasound guidance, the vein was accessed with a 21-gauge micropuncture needle, exchanged over a 018 guidewire for a 3 French vascular dilator. Dilator flushed and aspirated easily. Dilator was capped, flushed, and secured.  COMPLICATIONS: None immediate  FINDINGS: Patent right brachial vein.  Peripheral IV placed.  IMPRESSION: 1. Technically successful right brachial vein peripheral IV placement under ultrasound. Okay for routine use.   Electronically Signed   By: Lucrezia Europe M.D.   On: 10/03/2017 09:13  CLINICAL DATA:  Difficult peripheral IV access. Lung nodule. Needs access for PET-CT.  EXAM: IR RADIOLOGY PERIPHERAL GUIDED IV START  ANESTHESIA/SEDATION: None  MEDICATIONS: Lidocaine 1% subcutaneous  PROCEDURE: The procedure, risks (including but not limited to bleeding, infection, organ damage ), benefits, and alternatives were explained to the patient. Questions regarding the procedure were encouraged and answered. The patient understands and consents to the procedure.   Patency of the right brachial vein confirmed by ultrasound and documented. Overlying skin prepped with chlorhexidine, draped in usual sterile fashion. Maximal barrier sterile technique was utilized including caps, mask, sterile gowns, sterile gloves, sterile drape, hand hygiene and skin antiseptic. Under real-time ultrasound guidance, the vein was accessed with a 21-gauge micropuncture needle, exchanged over a 018 guidewire  for a 3 [place] vascular dilator. Dilator flushed and aspirated easily. Dilator was capped, flushed, and secured.  COMPLICATIONS: None immediate  FINDINGS: Patent right brachial vein.  Peripheral IV placed.  IMPRESSION: 1. Technically successful right brachial vein peripheral IV placement under ultrasound. Okay for routine use.   Electronically Signed   By: Lucrezia Europe M.D.   On: 10/03/2017 09:13  Ct Biopsy  Result Date: 10/10/2017 INDICATION: Left lower lobe lung mass EXAM: CT BIOPSY MEDICATIONS: None. ANESTHESIA/SEDATION: Fentanyl 100 mcg IV; Versed 2 mg IV Moderate Sedation Time:  12 minutes The patient was continuously monitored during the procedure by the interventional radiology nurse under my direct supervision. FLUOROSCOPY TIME:  Fluoroscopy Time:  minutes  seconds ( mGy). COMPLICATIONS: None immediate. PROCEDURE: Informed written consent was obtained from the patient after a thorough discussion of the procedural risks, benefits and alternatives. All questions were addressed. Maximal Sterile Barrier Technique was utilized including caps, mask, sterile gowns, sterile gloves, sterile drape, hand hygiene and skin antiseptic. A timeout was performed prior to the initiation of the procedure. Under CT guidance, a(n) 17 gauge guide needle was advanced into the left lower lobe lung mass. Subsequently 3 18 gauge core biopsies were obtained. The guide needle was removed. Post biopsy images demonstrate no hemorrhage or pneumothorax. Patient tolerated the procedure well without complication.  Vital sign monitoring by nursing staff during the procedure will continue as patient is in the special procedures unit for post procedure observation. FINDINGS: The images document guide needle placement within the left lower lobe lung mass. Post biopsy images demonstrate no pneumothorax. A tiny amount of gas in the inter costal space is noted. IMPRESSION: Successful CT-guided left lower lobe lung mass core biopsy. Electronically Signed   By: Marybelle Killings M.D.   On: 10/10/2017 15:59   Dg Chest Port 1 View  Result Date: 10/10/2017 CLINICAL DATA:  Post biopsy EXAM: PORTABLE CHEST 1 VIEW COMPARISON:  10/06/2017 FINDINGS: There is no pneumothorax status post left lung biopsy. Lungs remain hyperaerated. Left lower lobe mass is noted. Heart is normal in size. IMPRESSION: No pneumothorax post left lung biopsy. Electronically Signed   By: Marybelle Killings M.D.   On: 10/10/2017 14:18   Dg Chest Portable 1 View  Result Date: 10/06/2017 CLINICAL DATA:  Shortness of breath EXAM: PORTABLE CHEST 1 VIEW COMPARISON:  10/03/2017 FINDINGS: Cardiac shadow is within normal limits. Aortic calcifications are noted. The lungs are hyperinflated consistent with COPD. Patient's known left lower lobe mass lesion is again noted adjacent to the spine in a retrocardiac position. The known nodular changes in the bases are less well appreciated than on the prior exam. The band like density in the right base is again noted and stable. No bony abnormality is seen. IMPRESSION: Stable left lower lobe mass lesion and right basilar scarring. COPD. Electronically Signed   By: Inez Catalina M.D.   On: 10/06/2017 11:39   Dg Chest Port 1 View  Result Date: 10/01/2017 CLINICAL DATA:  Shortness of breath EXAM: PORTABLE CHEST 1 VIEW COMPARISON:  CT 09/18/2017, radiograph 09/17/2017 FINDINGS: Patchy right lower lobe infiltrate. Partially obscured medial left lung base mass. Hyperinflation with emphysematous disease. Stable cardiomediastinal silhouette  with aortic atherosclerosis. No pneumothorax. IMPRESSION: 1. No significant interval change in patchy right lung base infiltrate. 2. Stable medial left lung base pulmonary mass. Electronically Signed   By: Donavan Foil M.D.   On: 10/01/2017 15:58   Ir Radiology Peripheral Guided Iv Start  Result Date: 10/10/2017 INDICATION: Poor  IV access EXAM: VENA PUNCTURE BY MD MEDICATIONS: None ANESTHESIA/SEDATION: None FLUOROSCOPY TIME:  None COMPLICATIONS: None immediate. PROCEDURE: Informed written consent was obtained from the patient after a thorough discussion of the procedural risks, benefits and alternatives. All questions were addressed. Maximal Sterile Barrier Technique was utilized including caps, mask, sterile gowns, sterile gloves, sterile drape, hand hygiene and skin antiseptic. A timeout was performed prior to the initiation of the procedure. The right arm was prepped and draped in a sterile fashion. 1% lidocaine was utilized for local anesthesia. Under sonographic guidance, a micropuncture needle was inserted into the right basilic vein and removed over a 018 wire. The basilic vein was noted to be patent by sonography and documentation by sonography was performed. A 5 French transitional dilator was advanced over the wire. It was flushed and secured in place. IMPRESSION: Successful vena puncture by MD. Electronically Signed   By: Marybelle Killings M.D.   On: 10/10/2017 14:31   Ir Radiology Peripheral Guided Iv Start  Result Date: 10/03/2017 CLINICAL DATA:  Difficult peripheral IV access. Lung nodule. Needs access for PET-CT. EXAM: IR RADIOLOGY PERIPHERAL GUIDED IV START ANESTHESIA/SEDATION: None MEDICATIONS: Lidocaine 1% subcutaneous PROCEDURE: The procedure, risks (including but not limited to bleeding, infection, organ damage ), benefits, and alternatives were explained to the patient. Questions regarding the procedure were encouraged and answered. The patient understands and consents to the procedure.  Patency of the right brachial vein confirmed by ultrasound and documented. Overlying skin prepped with chlorhexidine, draped in usual sterile fashion. Maximal barrier sterile technique was utilized including caps, mask, sterile gowns, sterile gloves, sterile drape, hand hygiene and skin antiseptic. Under real-time ultrasound guidance, the vein was accessed with a 21-gauge micropuncture needle, exchanged over a 018 guidewire for a 3 French vascular dilator. Dilator flushed and aspirated easily. Dilator was capped, flushed, and secured. COMPLICATIONS: None immediate FINDINGS: Patent right brachial vein.  Peripheral IV placed. IMPRESSION: 1. Technically successful right brachial vein peripheral IV placement under ultrasound. Okay for routine use. Electronically Signed   By: Lucrezia Europe M.D.   On: 10/03/2017 09:13       IMPRESSION/PLAN: 1. 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  Recommend he discusses the findings from her recent imaging, and reviews the discussion so far with preliminary pathology.  Hopefully her final pathology will be available tomorrow.  We will contact her after thoracic oncology conference discussion for how we would recommend she move forward.  It appears that she will need colonoscopy for further assessment if this does not being a colorectal cancer.  In this setting, SBRT could still be considered as a form of ablative therapy to the lung, and if this turned out to be a stage I adenocarcinoma of the lung, that SBRT would also be considered.  Dr. Lisbeth Renshaw discusses the risks, benefits, short and long-term effects of therapy, and would recommend a course of 3-5 fractions of radiotherapy to the left lower lobe.  Again we will contact her based on the recommendations from conference 2. Protein calorie malnutrition.  Patient will be meeting with nutrition for suggestions on increasing her intake. 3. 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  This appears to be stable radiographically and no evidence of activity in the PET scan was seen in this area.  She will continue to be followed for this as we move forward with treatment recommendations.  4. Anxiety.  The patient will be referred to social work for further discussion as well.  She has a history of polysubstance abuse, and at this point I do not feel comfortable putting the patient on anxiety medication.  I contacted behavioral health to see if they had a acute care assessment ability for managing her symptoms.  She was recommended to be taken to the emergency room where she can be seen by a clinician from behavioral health.  The patient is willing to be admitted if this is necessary.  In a visit lasting 60 minutes, greater than 50% of the time was spent face to face discussing her case, and coordinating the patient's care.   The above documentation reflects my direct findings during this shared patient visit. Please see the separate note by Dr. Lisbeth Renshaw on this date for the remainder of the patient's plan of care.    Carola Rhine, PAC   Addendum: Upon finding out that the patient would not be prescribed benzodiazepines in the emergency room, the patient and her husband refused to allow any further assessment in the emergency room to determine a need for inpatient status.  I spoke with the nurse practitioner from behavioral health, and agree with her rationale for not just sending the patient out with these medications.  It appears that the patient will ultimately leave AMA.     Carola Rhine, PAC

## 2017-10-12 NOTE — Progress Notes (Signed)
Rx for Portable Oxygen sent to [facility] today.

## 2017-10-12 NOTE — Progress Notes (Signed)
Thoracic Location of Tumor / Histology: Renal cell carcinoma diagnosed in 2009 status post radiofrequency ablation as well as cryoablation last treatment was in 2012.   left lower lung mass suspicious for metastatic disease from renal cell carcinoma versus primary lung malignancy.  Patient presented with shortness of breath with exertion and has a cough.  PET 10/03/2017: 3.0 cm left lower lobe mass is hypermetabolic with maximum SUV of 8.0, compatible with malignancy.  This mass has central necrosis.  MRI Tspine 09/19/2017: Mild superior endplate deformity of T7, without significant bone marrow edema, retropulsion, or marrow replacement.  This abnormality is favored to represent a late subacute to chronic compression fracture.  CT Chest 09/18/2017: Mass within the medial aspects of the left lower lobe measuring 3.1 x 2.8 cm, corresponding to findings on recent chest x rays.  Biopsies of left lower lobe lung mass 10/10/2017 -Final path pending.  Initial impression is that of adenocarcinoma possibly of colon origin.  Tobacco/Marijuana/Snuff/ETOH use: current smoker  Past/Anticipated interventions by cardiothoracic surgery, if any:  Dr. Servando Snare 10/11/2017  -Severe cachexia-protein malnutrition. -3 cm left lung mass preliminary interpretation adenocarcinoma possible originating from the colon, not renal cell. -After reviewing the patient's history scans seen her and discussing her current functional status with her and her husband the patient is definitely not a candidate to consider any surgical intervention on her lung. -She is to be seen by radiation oncology tomorrow.   -Likely will need to wait until the final pathology is determine before treatment plan is decided upon.   Past/Anticipated interventions by medical oncology, if any:  NP Curcio 10/04/2017 -We discussed with the patient and her husband that we need to proceed with obtaining a biopsy. -If this is lung cancer, it would be  considered stage I. -We recommend evaluation by cardiothoracic surgery to see if she is a surgical candidate, if she is not a surgical candidate recommend treatment with radiation. -Chemo therapy would not be indicated for a stage I lung cancer. -We also discussed that this could be metastatic renal cell carcinoma.  If the biopsy shows this, we discussed that she would still be considered for surgery or radiation.  If it is renal cell carcinoma we may need to do additional therapy. -I have placed referrals to interventional radiology for a biopsy, cardiothoracic surgery and radiation oncology.  Prior Therapy: Radiofrequency ablation as well as cryoablation. Current Therapy: Observation   Signs/Symptoms  Weight changes, if any: Weight is stable since part of stomach has been removed. She is supplementing with Boost.  Respiratory complaints, if any: SOB, on oxygen, nebulizer treatments, inhalers. Constantly short of breath.  Hemoptysis, if any: Has productive cough, green phlegm.  Pain issues, if any:  Generalized pain 10/10.  She has diarrhea  BP 113/79 (BP Location: Left Arm, Patient Position: Sitting)   Pulse 90   Temp 98.2 F (36.8 C) (Oral)   Resp 18   Ht 5\' 4"  (1.626 m)   Wt 69 lb 6 oz (31.5 kg)   SpO2 99%   BMI 11.91 kg/m    Wt Readings from Last 3 Encounters:  10/12/17 69 lb 6 oz (31.5 kg)  10/11/17 69 lb 3.2 oz (31.4 kg)  10/10/17 75 lb (34 kg)    SAFETY ISSUES:  Prior radiation? Dr. Deniece Portela- kidney cancer burned off and cut off  Pacemaker/ICD? No  Possible current pregnancy? No  Is the patient on methotrexate? No  Current Complaints / other details:

## 2017-10-13 ENCOUNTER — Telehealth: Payer: Self-pay | Admitting: Radiation Oncology

## 2017-10-13 NOTE — Telephone Encounter (Signed)
I tried to call pt but could not leave a message.

## 2017-10-13 NOTE — Progress Notes (Signed)
Called and spoke with Mr. Vanorder to advise of T/D/L of apt with Dr. Benay Spice on 10/17/17 @ 2 PM with a 1:30 arrival.  He voiced understanding.

## 2017-10-13 NOTE — Telephone Encounter (Signed)
I was finally able to leave a message on the pt's husband's VM and let them know we'd be recommending SBRT. I've asked our staff to reach out to him to coordinate SBRT simulation.

## 2017-10-14 ENCOUNTER — Encounter: Payer: Self-pay | Admitting: General Practice

## 2017-10-14 NOTE — Progress Notes (Signed)
[facility] Psychosocial Distress Screening Clinical Social Work  Clinical Social Work was referred by distress screening protocol.  The patient scored a 10 on the Psychosocial Distress Thermometer which indicates severe distress. Clinical Social Worker contacted patient by phone to assess for distress and other psychosocial needs. Unable to reach patient, spoke w husband. "I have a lot of questions, and Im really totally sick of [name]."  Frustrated because "I am being told different things everywhere."  Patient has been sick for "40 years, 19 times in ED over a month", multiple medical problems. "It don't make no sense, I've been dealing with hospitals and doctors w her for 25 years."  Wants greater clarity about treatment plan and expectations.  Has been confused about changes in oncologists, wonders whether radiation will be on lung or colon, "everything with the hospital has been a mess."  "I wish they would tell me bottom line what she has."  Patient has had panic attacks since diagnosis of lung cancer, concerned that additional information would further upset her.  Husband concerned about patient's pain, anxiety, panic.  Sees PCP at [facility] who referred her to [facility] clinic who prescribed Suboxone.  This has been ineffective to adequately control pain.  Patient will start to receive 2 hours/day of CNA services in home through [facility]).  Also has home health services.  Husband is caring for her at home, "I want her kept comfortable while she goes through her treatments and to deal with her cancer."  Husband also requests that CSW find out process for getting Personal Care Services hours increased - patient has significant difficulty w mobility and ADLs, increasingly weak, hard time getting out of bed.  CSW called [facility], reviewed case w call center.  Was advised that MD should complete another DMA 3051 w medical change in status request, ensure that Section D is  completed prior to submission.  Once request is submitted to [facility], another appt will be scheduled to complete another evaluation to determine if additional PCS hours will be granted.    ONCBCN DISTRESS SCREENING 10/12/2017  Screening Type Initial Screening  Distress experienced in past week (1-10) 10  Emotional problem type Depression;Nervousness/Anxiety;Adjusting to illness;Isolation/feeling alone;Feeling hopeless;Boredom;Adjusting to appearance changes  Information Concerns Type Lack of info about diagnosis;Lack of info about treatment;Lack of info about maintaining fitness  Physical Problem type Pain;Sleep/insomnia;Getting around;Bathing/dressing;Breathing;Mouth sores/swallowing;Tingling hands/feet;Loss of appetitie  Other Husband Cell [phone], home [phone], call cell first    Clinical Social Worker follow up needed: Yes.    If yes, follow up plan:  Work w MD to submit [place] in Status request (Part D is required as well as other parts of this form).  [name], [place], LCSW Clinical Social Worker Phone:  [phone]

## 2017-10-17 ENCOUNTER — Telehealth: Payer: Self-pay

## 2017-10-17 ENCOUNTER — Inpatient Hospital Stay (HOSPITAL_BASED_OUTPATIENT_CLINIC_OR_DEPARTMENT_OTHER): Payer: Medicaid Other | Admitting: Oncology

## 2017-10-17 VITALS — BP 118/77 | HR 93 | Temp 97.6°F | Resp 18 | Ht 64.0 in | Wt <= 1120 oz

## 2017-10-17 DIAGNOSIS — Z85528 Personal history of other malignant neoplasm of kidney: Secondary | ICD-10-CM | POA: Diagnosis not present

## 2017-10-17 DIAGNOSIS — G8929 Other chronic pain: Secondary | ICD-10-CM

## 2017-10-17 DIAGNOSIS — M549 Dorsalgia, unspecified: Secondary | ICD-10-CM | POA: Diagnosis not present

## 2017-10-17 DIAGNOSIS — R918 Other nonspecific abnormal finding of lung field: Secondary | ICD-10-CM | POA: Diagnosis not present

## 2017-10-17 DIAGNOSIS — I2510 Atherosclerotic heart disease of native coronary artery without angina pectoris: Secondary | ICD-10-CM

## 2017-10-17 DIAGNOSIS — F419 Anxiety disorder, unspecified: Secondary | ICD-10-CM

## 2017-10-17 DIAGNOSIS — J449 Chronic obstructive pulmonary disease, unspecified: Secondary | ICD-10-CM

## 2017-10-17 DIAGNOSIS — I700 Atherosclerosis of aorta: Secondary | ICD-10-CM

## 2017-10-17 DIAGNOSIS — Z79899 Other long term (current) drug therapy: Secondary | ICD-10-CM

## 2017-10-17 NOTE — Progress Notes (Signed)
Met with [name] and her husband. Explained role of nurse navigator.    No barriers to care identified at present time.  Will continue to follow as needed

## 2017-10-17 NOTE — Telephone Encounter (Signed)
Printed avs and calender of upcoming appointment. Per 10/14 los

## 2017-10-17 NOTE — Progress Notes (Signed)
[facility] New Patient Consult   Requesting MD: Azzie Glatter, [place], [place] [address]   [name] 67 y.o.  [birth year]    Reason for Consult: Lung mass   HPI: [name] has a complex medical history including a remote history of renal cell carcinoma.  She underwent radiofrequency ablation of a left renal cell carcinoma in January 2009 followed by cryoablation in May 2012 for a local recurrence. She was admitted last month with pneumonia.  On 09/15/2017 a chest x-ray revealed a right lower lobe infiltrate and a mass in the left lower lobe.  A CT of the chest on 09/18/2017 revealed a 3.1 cm left lower lobe mass, patchy consolidation at the bilateral lung base, small bilateral effusions, and a new T7 compression fracture. MRI of the thoracic spine 09/19/2017 revealed a mild superior endplate deformity at T7 felt to represent an osteopenic fracture.  She was referred for a PET scan on 10/03/2017.  A 3 cm left lower lobe pleural-based mass is hypermetabolic.  Faint nodularity in both lower lobes with low associated metabolic activity felt to be inflammatory.  The ablation defect at the left kidney is not hypermetabolic.  No hypermetabolic bone lesions.  High muscular activity was noted in the pterygoid muscles and tongue out a CT correlate. She underwent a CT-guided biopsy of the lung mass on 10/10/2017.  The pathology revealed metastatic adenocarcinoma the tumor cells are focally positive for CDX-2.  The TTF-1 is negative.  Pathology and immunohistochemistry are consistent with a colorectal primary.  She saw Dr. Julien Nordmann and is referred to the GI oncology program after the pathology suggested a colorectal primary.  Past Medical History:  Diagnosis Date  . Cancer [facility])  2009   renal ca, initial treatment with RFA, local recurrence treated with cryoablation in 2012  . COPD (chronic obstructive pulmonary disease) ([place])   . Drug-seeking behavior   .  Pancreatitis   . Pancreatitis   . Seizures ([place])   . Substance abuse [facility])     Past Surgical History:  Procedure Laterality Date  . HIP SURGERY Bilateral   . IR GENERIC HISTORICAL  05/08/2015   IR RADIOLOGIST EVAL & MGMT 05/08/2015 Aletta Edouard, MD [facility] INTERV RAD  . IR GENERIC HISTORICAL  01/08/2014   IR RADIOLOGIST EVAL & MGMT 01/08/2014 Aletta Edouard, MD [facility] INTERV RAD  . IR RADIOLOGY PERIPHERAL GUIDED IV START  10/03/2017  . IR RADIOLOGY PERIPHERAL GUIDED IV START  10/10/2017  . IR US GUIDE VASC ACCESS RIGHT  10/05/2017  . IR US GUIDE VASC ACCESS RIGHT  10/10/2017  . KIDNEY SURGERY     removed cancerous lesions  . PARTIAL GASTRECTOMY      Medications: Reviewed  Allergies:  Allergies  Allergen Reactions  . Aspirin Other (See Comments)    Break out in welts  . Chlorpromazine Hcl Other (See Comments)    Muscle spasms - "really bad"    Family history: His father died of lung cancer was a smoker.  A maternal uncle died of colon cancer at age 85.  No other family history of cancer.  Social History:   She lives with her husband in [place].  She is disabled.  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  She smokes cigarettes.  ROS:   Positives include: Anorexia, solid dysphasia, burning with urination, dyspnea-improved with home oxygen, chronic constipation, "I pain ", pain at the "  stomach "-ribs-low back and  legs for years  A complete ROS was otherwise negative.  Physical Exam:  Blood pressure 118/77, pulse 93, temperature 97.6 F (36.4 C), temperature source Oral, resp. rate 18, height 5\' 4"  (1.626 m), weight 69 lb 14.4 oz (31.7 kg), SpO2 93 %.  HEENT: Upper denture plate, oropharynx without visible mass, neck without mass Lungs: Distant breath sounds, bilateral inspiratory/expiratory wheeze, no respiratory distress Cardiac: Regular rate and rhythm Abdomen: No hepatosplenomegaly, no mass, nontender  Vascular: No leg  edema Lymph nodes: "Shotty "bilateral axillary and inguinal nodes.  No cervical or supraclavicular nodes Neurologic: Alert and oriented, the motor exam appears intact in the upper and lower extremities Skin: No rash Musculoskeletal: Diffuse spine tenderness   LAB:  CBC  Lab Results  Component Value Date   WBC 9.5 10/10/2017   HGB 9.8 (L) 10/10/2017   HCT 31.3 (L) 10/10/2017   MCV 90.7 10/10/2017   PLT 275 10/10/2017   NEUTROABS 5.2 10/06/2017        CMP  Lab Results  Component Value Date   NA 139 10/06/2017   K 5.0 10/06/2017   CL 103 10/06/2017   CO2 27 10/06/2017   GLUCOSE 121 (H) 10/06/2017   BUN 10 10/06/2017   CREATININE 0.78 10/06/2017   CALCIUM 10.0 10/06/2017   PROT 6.3 (L) 10/01/2017   ALBUMIN 3.3 (L) 10/01/2017   AST 16 10/01/2017   ALT 11 10/01/2017   ALKPHOS 69 10/01/2017   BILITOT 0.4 10/01/2017   GFRNONAA >60 10/06/2017   GFRAA >60 10/06/2017     Imaging:  As per HPI- PET images from 10/03/2017 reviewed with [name] and her husband   Assessment/Plan:   1. Left lung mass-CT biopsy 10/10/2017 revealed metastatic adenocarcinoma, focal CDX-2 positivity, TTF-1 negative  PET scan 10/03/2017-hypermetabolic 3 similar left lower lobe mass, low level metabolic activity associated with bibasilar nodular and bandlike densities felt to be inflammatory, high activity in the tongue and neck musculature 2. COPD 3. Pancreatitis 4. Chronic pain syndrome 5. Left renal cell carcinoma treated with RFA in 2009 and cryoablation in 2012 6. Remote history of heavy alcohol use 7. Ongoing tobacco use 8. History of a partial gastrectomy 9. Anemia   Disposition:   [name] has been diagnosed with adenocarcinoma involving a left lung mass.  She understands the histology and immunohistochemical profile is not suggestive of a primary lung cancer, but this is possible.  There was no apparent primary tumor site outside of the chest upon review of her history,  examination, and staging PET scan.  She most likely has a subclinical primary tumor site that has metastasized to the lung.  I reviewed the PET images with her.  Her pain is most likely unrelated to the cancer.  There is no evidence of metastatic disease involving the bones on the staging PET scan or a thoracic MRI.  She has seen Dr. Lisbeth Renshaw and is being scheduled for SBRT to the left lung mass.  [name] stands this will most likely not be curative as she may have a primary tumor site elsewhere.  I will present her case at the GI tumor conference, submit Foundation 1 testing, and consider additional immunohistochemical stains.  We will check a CEA.  We contacted [facility] gastroenterology.  She last had an upper endoscopy in 2002 and there is no record of her previous colonoscopy.  We will consider referral for upper and lower endoscopies as well as a head and neck exam based on  discussion at the GI tumor conference.  I recommended she continue to obtain pain medication and anxiolytics via the pain clinic.  50 minutes were spent with the patient today.  The majority of the time was used for counseling and coordination of care.  Betsy Coder, MD  10/17/2017, 4:56 PM

## 2017-10-19 ENCOUNTER — Ambulatory Visit
Admission: RE | Admit: 2017-10-19 | Discharge: 2017-10-19 | Disposition: A | Discharge status: Home / Self Care | Payer: Medicaid Other | Source: Ambulatory Visit | Attending: Radiation Oncology | Admitting: Radiation Oncology

## 2017-10-19 ENCOUNTER — Inpatient Hospital Stay: Payer: Medicaid Other

## 2017-10-19 DIAGNOSIS — C7931 Secondary malignant neoplasm of brain: Secondary | ICD-10-CM

## 2017-10-19 DIAGNOSIS — Z510 Encounter for antineoplastic radiation therapy: Secondary | ICD-10-CM | POA: Diagnosis not present

## 2017-10-19 DIAGNOSIS — R918 Other nonspecific abnormal finding of lung field: Secondary | ICD-10-CM

## 2017-10-19 DIAGNOSIS — Z8553 Personal history of malignant neoplasm of renal pelvis: Secondary | ICD-10-CM | POA: Insufficient documentation

## 2017-10-19 DIAGNOSIS — C7801 Secondary malignant neoplasm of right lung: Secondary | ICD-10-CM | POA: Insufficient documentation

## 2017-10-19 DIAGNOSIS — C801 Malignant (primary) neoplasm, unspecified: Secondary | ICD-10-CM | POA: Insufficient documentation

## 2017-10-19 LAB — CBC WITH DIFFERENTIAL (CANCER CENTER ONLY)
ABS IMMATURE GRANULOCYTES: 0.04 10*3/uL (ref 0.00–0.07)
Basophils Absolute: 0.1 10*3/uL (ref 0.0–0.1)
Basophils Relative: 1 %
EOS PCT: 2 %
Eosinophils Absolute: 0.2 10*3/uL (ref 0.0–0.5)
HCT: 30.9 % — ABNORMAL LOW (ref 36.0–46.0)
HEMOGLOBIN: 9.8 g/dL — AB (ref 12.0–15.0)
Immature Granulocytes: 0 %
LYMPHS PCT: 17 %
Lymphs Abs: 1.8 10*3/uL (ref 0.7–4.0)
MCH: 27.9 pg (ref 26.0–34.0)
MCHC: 31.7 g/dL (ref 30.0–36.0)
MCV: 88 fL (ref 80.0–100.0)
MONO ABS: 0.5 10*3/uL (ref 0.1–1.0)
MONOS PCT: 4 %
NEUTROS ABS: 8 10*3/uL — AB (ref 1.7–7.7)
Neutrophils Relative %: 76 %
Platelet Count: 247 10*3/uL (ref 150–400)
RBC: 3.51 MIL/uL — ABNORMAL LOW (ref 3.87–5.11)
RDW: 15.4 % (ref 11.5–15.5)
WBC Count: 10.6 10*3/uL — ABNORMAL HIGH (ref 4.0–10.5)
nRBC: 0 % (ref 0.0–0.2)

## 2017-10-19 LAB — FERRITIN: Ferritin: 15 ng/mL (ref 11–307)

## 2017-10-19 LAB — CEA (IN HOUSE-CHCC): CEA (CHCC-In House): 7.08 ng/mL — ABNORMAL HIGH (ref 0.00–5.00)

## 2017-10-23 ENCOUNTER — Encounter (HOSPITAL_COMMUNITY): Payer: Self-pay

## 2017-10-23 ENCOUNTER — Emergency Department (HOSPITAL_COMMUNITY): Payer: Medicaid Other

## 2017-10-23 ENCOUNTER — Other Ambulatory Visit: Payer: Self-pay

## 2017-10-23 ENCOUNTER — Observation Stay (HOSPITAL_COMMUNITY)
Admission: EM | Admit: 2017-10-23 | Discharge: 2017-10-25 | Disposition: A | Discharge status: Home / Self Care | Payer: Medicaid Other | Attending: Internal Medicine | Admitting: Internal Medicine

## 2017-10-23 DIAGNOSIS — F17210 Nicotine dependence, cigarettes, uncomplicated: Secondary | ICD-10-CM | POA: Insufficient documentation

## 2017-10-23 DIAGNOSIS — I700 Atherosclerosis of aorta: Secondary | ICD-10-CM | POA: Diagnosis not present

## 2017-10-23 DIAGNOSIS — Z79899 Other long term (current) drug therapy: Secondary | ICD-10-CM | POA: Insufficient documentation

## 2017-10-23 DIAGNOSIS — J441 Chronic obstructive pulmonary disease with (acute) exacerbation: Principal | ICD-10-CM | POA: Diagnosis present

## 2017-10-23 DIAGNOSIS — I2510 Atherosclerotic heart disease of native coronary artery without angina pectoris: Secondary | ICD-10-CM | POA: Insufficient documentation

## 2017-10-23 DIAGNOSIS — F411 Generalized anxiety disorder: Secondary | ICD-10-CM | POA: Diagnosis not present

## 2017-10-23 DIAGNOSIS — J449 Chronic obstructive pulmonary disease, unspecified: Secondary | ICD-10-CM

## 2017-10-23 DIAGNOSIS — K860 Alcohol-induced chronic pancreatitis: Secondary | ICD-10-CM | POA: Diagnosis not present

## 2017-10-23 DIAGNOSIS — K219 Gastro-esophageal reflux disease without esophagitis: Secondary | ICD-10-CM | POA: Diagnosis present

## 2017-10-23 DIAGNOSIS — Z720 Tobacco use: Secondary | ICD-10-CM

## 2017-10-23 DIAGNOSIS — R918 Other nonspecific abnormal finding of lung field: Secondary | ICD-10-CM | POA: Diagnosis present

## 2017-10-23 DIAGNOSIS — D696 Thrombocytopenia, unspecified: Secondary | ICD-10-CM | POA: Diagnosis not present

## 2017-10-23 DIAGNOSIS — F13239 Sedative, hypnotic or anxiolytic dependence with withdrawal, unspecified: Secondary | ICD-10-CM | POA: Insufficient documentation

## 2017-10-23 DIAGNOSIS — E43 Unspecified severe protein-calorie malnutrition: Secondary | ICD-10-CM | POA: Diagnosis present

## 2017-10-23 DIAGNOSIS — R627 Adult failure to thrive: Secondary | ICD-10-CM | POA: Diagnosis present

## 2017-10-23 DIAGNOSIS — E876 Hypokalemia: Secondary | ICD-10-CM | POA: Diagnosis present

## 2017-10-23 DIAGNOSIS — Z886 Allergy status to analgesic agent status: Secondary | ICD-10-CM | POA: Insufficient documentation

## 2017-10-23 DIAGNOSIS — Z85528 Personal history of other malignant neoplasm of kidney: Secondary | ICD-10-CM | POA: Insufficient documentation

## 2017-10-23 DIAGNOSIS — Z8711 Personal history of peptic ulcer disease: Secondary | ICD-10-CM

## 2017-10-23 DIAGNOSIS — F1120 Opioid dependence, uncomplicated: Secondary | ICD-10-CM | POA: Diagnosis not present

## 2017-10-23 DIAGNOSIS — I10 Essential (primary) hypertension: Secondary | ICD-10-CM | POA: Diagnosis present

## 2017-10-23 LAB — CBC WITH DIFFERENTIAL/PLATELET
Abs Immature Granulocytes: 0.03 10*3/uL (ref 0.00–0.07)
BASOS ABS: 0.1 10*3/uL (ref 0.0–0.1)
Basophils Relative: 1 %
EOS ABS: 0.1 10*3/uL (ref 0.0–0.5)
EOS PCT: 1 %
HCT: 32.4 % — ABNORMAL LOW (ref 36.0–46.0)
HEMOGLOBIN: 10.2 g/dL — AB (ref 12.0–15.0)
IMMATURE GRANULOCYTES: 0 %
LYMPHS PCT: 15 %
Lymphs Abs: 1.6 10*3/uL (ref 0.7–4.0)
MCH: 28 pg (ref 26.0–34.0)
MCHC: 31.5 g/dL (ref 30.0–36.0)
MCV: 89 fL (ref 80.0–100.0)
Monocytes Absolute: 0.6 10*3/uL (ref 0.1–1.0)
Monocytes Relative: 6 %
NRBC: 0 % (ref 0.0–0.2)
Neutro Abs: 8.3 10*3/uL — ABNORMAL HIGH (ref 1.7–7.7)
Neutrophils Relative %: 77 %
Platelets: 310 10*3/uL (ref 150–400)
RBC: 3.64 MIL/uL — AB (ref 3.87–5.11)
RDW: 15.4 % (ref 11.5–15.5)
WBC: 10.6 10*3/uL — ABNORMAL HIGH (ref 4.0–10.5)

## 2017-10-23 LAB — RAPID URINE DRUG SCREEN, HOSP PERFORMED
Amphetamines: NOT DETECTED
Barbiturates: NOT DETECTED
Benzodiazepines: POSITIVE — AB
Cocaine: NOT DETECTED
OPIATES: NOT DETECTED
Tetrahydrocannabinol: NOT DETECTED

## 2017-10-23 LAB — URINALYSIS, ROUTINE W REFLEX MICROSCOPIC
BILIRUBIN URINE: NEGATIVE
Bacteria, UA: NONE SEEN
Glucose, UA: NEGATIVE mg/dL
Ketones, ur: NEGATIVE mg/dL
LEUKOCYTES UA: NEGATIVE
Nitrite: NEGATIVE
Protein, ur: NEGATIVE mg/dL
SPECIFIC GRAVITY, URINE: 1.03 (ref 1.005–1.030)
pH: 7 (ref 5.0–8.0)

## 2017-10-23 LAB — BLOOD GAS, VENOUS
Acid-Base Excess: 0.5 mmol/L (ref 0.0–2.0)
Bicarbonate: 25.1 mmol/L (ref 20.0–28.0)
O2 Saturation: 56.8 %
PH VEN: 7.389 (ref 7.250–7.430)
Patient temperature: 98.6
pCO2, Ven: 42.4 mmHg — ABNORMAL LOW (ref 44.0–60.0)
pO2, Ven: 31.4 mmHg — CL (ref 32.0–45.0)

## 2017-10-23 LAB — BASIC METABOLIC PANEL
Anion gap: 11 (ref 5–15)
BUN: 9 mg/dL (ref 8–23)
CALCIUM: 9.7 mg/dL (ref 8.9–10.3)
CO2: 25 mmol/L (ref 22–32)
CREATININE: 0.7 mg/dL (ref 0.44–1.00)
Chloride: 101 mmol/L (ref 98–111)
GFR calc non Af Amer: >60 mL/min (ref >60)
Glucose, Bld: 103 mg/dL — ABNORMAL HIGH (ref 70–99)
Potassium: 4.2 mmol/L (ref 3.5–5.1)
SODIUM: 137 mmol/L (ref 135–145)

## 2017-10-23 MED ORDER — METHYLPREDNISOLONE SODIUM SUCC 125 MG IJ SOLR
60.0000 mg | Freq: Three times a day (TID) | INTRAMUSCULAR | Status: AC
  Administered 2017-10-23 – 2017-10-24 (×3): 60 mg via INTRAVENOUS
  Filled 2017-10-23 (×3): qty 2

## 2017-10-23 MED ORDER — SODIUM CHLORIDE 0.9 % IV SOLN
1.0000 g | INTRAVENOUS | Status: DC
  Administered 2017-10-23: 1 g via INTRAVENOUS
  Filled 2017-10-23 (×2): qty 10

## 2017-10-23 MED ORDER — NICOTINE 14 MG/24HR TD PT24
14.0000 mg | MEDICATED_PATCH | Freq: Every day | TRANSDERMAL | Status: DC
  Filled 2017-10-23 (×2): qty 1

## 2017-10-23 MED ORDER — BUDESONIDE 0.5 MG/2ML IN SUSP
0.5000 mg | Freq: Two times a day (BID) | RESPIRATORY_TRACT | Status: DC
  Administered 2017-10-24: 0.5 mg via RESPIRATORY_TRACT
  Filled 2017-10-23 (×2): qty 2

## 2017-10-23 MED ORDER — OXYCODONE-ACETAMINOPHEN 5-325 MG PO TABS
1.0000 | ORAL_TABLET | Freq: Once | ORAL | Status: AC
  Administered 2017-10-23: 1 via ORAL
  Filled 2017-10-23: qty 1

## 2017-10-23 MED ORDER — IPRATROPIUM BROMIDE 0.02 % IN SOLN: 0.5000 mg | RESPIRATORY_TRACT | Status: DC | PRN

## 2017-10-23 MED ORDER — SODIUM CHLORIDE 0.9 % IJ SOLN
INTRAMUSCULAR | Status: AC
  Filled 2017-10-23: qty 50

## 2017-10-23 MED ORDER — SODIUM CHLORIDE 0.9% FLUSH
3.0000 mL | Freq: Two times a day (BID) | INTRAVENOUS | Status: DC
  Administered 2017-10-23 – 2017-10-25 (×3): 3 mL via INTRAVENOUS

## 2017-10-23 MED ORDER — LEVALBUTEROL HCL 1.25 MG/0.5ML IN NEBU
1.2500 mg | INHALATION_SOLUTION | Freq: Once | RESPIRATORY_TRACT | Status: AC
  Administered 2017-10-23: 1.25 mg via RESPIRATORY_TRACT
  Filled 2017-10-23: qty 0.5

## 2017-10-23 MED ORDER — LORAZEPAM 2 MG/ML IJ SOLN
1.0000 mg | Freq: Once | INTRAMUSCULAR | Status: AC
  Administered 2017-10-23: 1 mg via INTRAVENOUS
  Filled 2017-10-23: qty 1

## 2017-10-23 MED ORDER — ENOXAPARIN SODIUM 30 MG/0.3ML ~~LOC~~ SOLN
30.0000 mg | Freq: Every day | SUBCUTANEOUS | Status: DC
  Administered 2017-10-24: 30 mg via SUBCUTANEOUS
  Filled 2017-10-23: qty 0.3

## 2017-10-23 MED ORDER — METHYLPREDNISOLONE SODIUM SUCC 125 MG IJ SOLR: 125.0000 mg | Freq: Once | INTRAMUSCULAR | Status: DC

## 2017-10-23 MED ORDER — IOPAMIDOL (ISOVUE-370) INJECTION 76%
100.0000 mL | Freq: Once | INTRAVENOUS | Status: AC | PRN
  Administered 2017-10-23: 70 mL via INTRAVENOUS

## 2017-10-23 MED ORDER — IPRATROPIUM BROMIDE 0.02 % IN SOLN
0.5000 mg | Freq: Four times a day (QID) | RESPIRATORY_TRACT | Status: DC
  Administered 2017-10-24: 0.5 mg via RESPIRATORY_TRACT
  Filled 2017-10-23: qty 2.5

## 2017-10-23 MED ORDER — SODIUM CHLORIDE 0.9 % IV SOLN
INTRAVENOUS | Status: DC
  Administered 2017-10-23: 23:00:00 via INTRAVENOUS

## 2017-10-23 MED ORDER — FAMOTIDINE 20 MG PO TABS
20.0000 mg | ORAL_TABLET | Freq: Every day | ORAL | Status: DC
  Administered 2017-10-24 – 2017-10-25 (×2): 20 mg via ORAL
  Filled 2017-10-23 (×2): qty 1

## 2017-10-23 MED ORDER — ALPRAZOLAM 0.25 MG PO TABS
0.2500 mg | ORAL_TABLET | Freq: Three times a day (TID) | ORAL | Status: DC | PRN
  Administered 2017-10-23 – 2017-10-24 (×2): 0.25 mg via ORAL
  Filled 2017-10-23 (×2): qty 1

## 2017-10-23 MED ORDER — LEVALBUTEROL HCL 0.63 MG/3ML IN NEBU
0.6300 mg | INHALATION_SOLUTION | Freq: Once | RESPIRATORY_TRACT | Status: AC
  Administered 2017-10-23: 0.63 mg via RESPIRATORY_TRACT
  Filled 2017-10-23: qty 3

## 2017-10-23 MED ORDER — SODIUM CHLORIDE 0.9 % IV BOLUS
1000.0000 mL | Freq: Once | INTRAVENOUS | Status: AC
  Administered 2017-10-23: 1000 mL via INTRAVENOUS

## 2017-10-23 MED ORDER — LEVALBUTEROL HCL 0.63 MG/3ML IN NEBU
0.6300 mg | INHALATION_SOLUTION | Freq: Four times a day (QID) | RESPIRATORY_TRACT | Status: DC
  Administered 2017-10-24: 0.63 mg via RESPIRATORY_TRACT
  Filled 2017-10-23: qty 3

## 2017-10-23 MED ORDER — ACETAMINOPHEN 325 MG PO TABS: 650.0000 mg | ORAL_TABLET | Freq: Four times a day (QID) | ORAL | Status: DC | PRN

## 2017-10-23 MED ORDER — IOPAMIDOL (ISOVUE-370) INJECTION 76%
INTRAVENOUS | Status: AC
  Filled 2017-10-23: qty 100

## 2017-10-23 MED ORDER — ENSURE ENLIVE PO LIQD
237.0000 mL | Freq: Two times a day (BID) | ORAL | Status: DC
  Administered 2017-10-24: 237 mL via ORAL

## 2017-10-23 MED ORDER — PREDNISONE 20 MG PO TABS
40.0000 mg | ORAL_TABLET | Freq: Every day | ORAL | Status: DC
  Administered 2017-10-25: 40 mg via ORAL
  Filled 2017-10-23: qty 2

## 2017-10-23 MED ORDER — HYDROCODONE-ACETAMINOPHEN 5-325 MG PO TABS
1.0000 | ORAL_TABLET | Freq: Four times a day (QID) | ORAL | Status: DC | PRN
  Administered 2017-10-23 – 2017-10-25 (×7): 2 via ORAL
  Filled 2017-10-23 (×7): qty 2

## 2017-10-23 MED ORDER — METHYLPREDNISOLONE SODIUM SUCC 125 MG IJ SOLR
125.0000 mg | Freq: Once | INTRAMUSCULAR | Status: AC
  Administered 2017-10-23: 125 mg via INTRAVENOUS
  Filled 2017-10-23: qty 2

## 2017-10-23 MED ORDER — LEVALBUTEROL HCL 0.63 MG/3ML IN NEBU: 0.6300 mg | INHALATION_SOLUTION | RESPIRATORY_TRACT | Status: DC | PRN

## 2017-10-23 MED ORDER — LOSARTAN POTASSIUM 50 MG PO TABS
50.0000 mg | ORAL_TABLET | Freq: Every day | ORAL | Status: DC
  Administered 2017-10-24 – 2017-10-25 (×2): 50 mg via ORAL
  Filled 2017-10-23 (×2): qty 1

## 2017-10-23 NOTE — ED Provider Notes (Signed)
CT angios does not show any evidence of pulmonary embolus, pneumonia or other new findings.  There is a known lung mass that was seen.  I spoke with Dr. Grandville Silos with the hospitalist service who will admit the patient.   Janice Johns, MD 10/23/17 1754

## 2017-10-23 NOTE — ED Provider Notes (Addendum)
[facility] DEPT Provider Note   CSN: [id number] Arrival date & time: 10/23/17  1133     History   Chief Complaint Chief Complaint  Patient presents with  . Anxiety    HPI [name] is a 67 y.o. female.  HPI 67 year old female with history of COPD, pancreatitis, chronic substance abuse, here with multiple complaints.  Patient primary complaint is shortness of breath and sensation like she cannot breathe.  Patient reports that over the last several weeks, she is a progressive worsening shortness of breath and sensation that she cannot breathe.  She was seen in the ED approximately a month ago, for similar symptoms, and was diagnosed with anxiety.  She states that she recently learned that she had possible metastatic cancer and has been having increasing shortness of breath since then.  She feels like some of it is anxiety, though she is also been coughing more than usual.  She has some associated diffuse chest wall pain.  Denies any alleviating factors.  She does not have any current medications for anxiety at home.  She has appointment with her psychiatrist tomorrow.  No increased sputum production.  Past Medical History:  Diagnosis Date  . Cancer ([place])    renal ca  . COPD (chronic obstructive pulmonary disease) ([place])   . Drug-seeking behavior   . Pancreatitis   . Pancreatitis   . Seizures ([place])   . Substance abuse [facility])     Patient Active Problem List   Diagnosis Date Noted  . Lung mass 10/04/2017  . Pneumonia 09/19/2017  . PNA (pneumonia) 09/17/2017  . Osteoarthritis of facet joint of lumbar spine 07/26/2017  . Low back pain radiating to both legs 07/26/2017  . 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 Failure to thrive in adult 06/21/2017  . Pressure injury of skin 04/06/2017  . Pressure injury of skin of right ischial tuberosity region: Stage 2 04/06/2017  . Acute urinary retention 04/04/2017  .  Benzodiazepine dependence ([place])   . AKI (acute kidney injury) ([place]) 04/03/2017  . Hip fracture, right ([place]) 06/02/2016  . Narcotic addiction ([place]) 02/29/2016  . Abdominal pain, chronic, epigastric- due to chronic pancreatitis 02/29/2016  . COPD (chronic obstructive pulmonary disease) ([place]) 02/28/2016  . Cough 09/03/2015  . Abdominal pain 01/03/2015  . Hyponatremia 01/03/2015  . Hypocalcemia 01/03/2015  . Underweight 01/03/2015  . COPD GOLD III with reversible component  07/17/2014  . DTs (delirium tremens) ([place]) 06/02/2013  . Chronic alcoholic pancreatitis ([place]) 06/02/2013  . Lactic acidosis 06/02/2013  . Recurrent acute pancreatitis 03/28/2013  . Alcohol withdrawal ([place]) 03/28/2013  . Pancreatitis 03/09/2013  . Tobacco abuse 03/09/2013  . Alcohol withdrawal syndrome with perceptual disturbance ([place]) 03/09/2013  . Acute alcoholic pancreatitis 41/93/7902  . Benzodiazepine withdrawal ([place]) 02/09/2013  . Protein-calorie malnutrition, severe ([place]) 02/09/2013  . Hypokalemia 02/27/2011  . Abdominal pain, acute 02/25/2011  . Nausea and vomiting 02/25/2011  . Thrombocytopenia ([place]) 02/25/2011  . COPD with acute exacerbation ([place]) 02/25/2011  . HTN (hypertension), malignant 02/25/2011  . 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 Anxiety state 10/18/2008  . GERD 10/18/2008  . PANCREATITIS, CHRONIC- atrophic pancreas 10/18/2008  . DYSPNEA 10/18/2008  . GASTRIC ULCER, HX OF 10/18/2008    Past Surgical History:  Procedure Laterality Date  . HIP SURGERY Bilateral   . IR GENERIC HISTORICAL  05/08/2015   IR RADIOLOGIST EVAL & MGMT 05/08/2015 Aletta Edouard, MD [facility] INTERV RAD  . IR  GENERIC HISTORICAL  01/08/2014   IR RADIOLOGIST EVAL & MGMT 01/08/2014 Aletta Edouard, MD [facility] INTERV RAD  . IR RADIOLOGY PERIPHERAL GUIDED IV START  10/03/2017  . IR RADIOLOGY PERIPHERAL GUIDED IV START  10/10/2017  . IR US GUIDE VASC ACCESS RIGHT  10/05/2017  . IR US GUIDE VASC  ACCESS RIGHT  10/10/2017  . KIDNEY SURGERY     removed cancerous lesions  . PARTIAL GASTRECTOMY       OB History   None      Home Medications    Prior to Admission medications   Medication Sig Start Date End Date Taking? Authorizing Provider  ALPRAZolam (XANAX) 0.25 MG tablet Take 1 tablet (0.25 mg total) by mouth 3 (three) times daily as needed for anxiety. Patient not taking: Reported on 10/12/2017 10/01/17   Charlesetta Shanks, MD  amoxicillin-clavulanate (AUGMENTIN) 875-125 MG tablet Take 1 tablet by mouth 2 (two) times daily. One po bid x 7 days Patient not taking: Reported on 10/10/2017 10/01/17   Charlesetta Shanks, MD  busPIRone (BUSPAR) 10 MG tablet Take 10 mg by mouth 3 (three) times daily as needed (anxiety).    [provider]  famotidine (PEPCID) 20 MG tablet Take 1 tablet (20 mg total) by mouth daily. Patient taking differently: Take 20 mg by mouth daily as needed for heartburn or indigestion.  08/28/17   Khatri, Hina, PA-C  hydrOXYzine (ATARAX/VISTARIL) 25 MG tablet Take 1 tablet (25 mg total) by mouth every 6 (six) hours as needed for anxiety. Patient not taking: Reported on 10/12/2017 10/06/17   Frederica Kuster, PA-C  lidocaine (LIDODERM) 5 % Place 1 patch onto the skin daily. Remove & Discard patch within 12 hours or as directed by MD Patient not taking: Reported on 10/01/2017 09/25/17   Dana Allan I, MD  losartan (COZAAR) 50 MG tablet Take 50 mg by mouth daily.    [provider]  mirabegron ER (MYRBETRIQ) 25 MG TB24 tablet Take 1 tablet (25 mg total) by mouth daily. Patient not taking: Reported on 10/01/2017 09/25/17 10/25/17  Dana Allan I, MD  mometasone-formoterol [facility]) 200-5 MCG/ACT AERO Inhale 2 puffs into the lungs 2 (two) times daily. Patient taking differently: Inhale 3 puffs into the lungs daily.  08/08/17   Azzie Glatter, FNP  Multiple Vitamin (MULTIVITAMIN) LIQD Take 15 mLs by mouth daily. Patient not taking: Reported on 10/01/2017  09/25/17 10/25/17  Dana Allan I, MD  ondansetron [facility]) 4 MG tablet Take 4 mg by mouth daily as needed for nausea/vomiting. 09/26/17   [provider]  polyethylene glycol powder (GLYCOLAX/MIRALAX) powder Mix 1 capful of powder into drink and take by mouth 1-3 times daily until daily soft stools OTC Patient not taking: Reported on 10/01/2017 08/30/17   Little, Wenda Overland, MD  PROAIR HFA 108 [phone] Base) MCG/ACT inhaler Inhale 1 puff into the lungs every 4 (four) hours as needed for wheezing or shortness of breath.  08/02/17   [provider]  SPIRIVA HANDIHALER 18 MCG inhalation capsule PLACE 1 CAPSULE INTO INHALER AND INHALE DAILY Patient taking differently: Place 18 mcg into inhaler and inhale daily as needed (shortness of breath).  09/08/17   Azzie Glatter, FNP  SUBOXONE 8-2 MG FILM Take 1 Film by mouth every 12 (twelve) hours. 09/30/17   [provider]  tamsulosin (FLOMAX) 0.4 MG CAPS capsule Take 1 capsule (0.4 mg total) by mouth daily. Patient not taking: Reported on 10/10/2017 09/25/17   Bonnell Public, MD  Family History Family History  Problem Relation Age of Onset  . CAD Mother   . Alcoholism Father   . COPD Father   . Lung cancer Father   . Rectal cancer Maternal Uncle     Social History Social History   Tobacco Use  . Smoking status: Current Every Day Smoker    Packs/day: 0.50    Years: 30.00    Pack years: 15.00    Types: Cigarettes  . Smokeless tobacco: Never Used  . Tobacco comment: smoking up to 1.5 ppd per husband. down from 3pk day to 1.5   Substance Use Topics  . Alcohol use: Not Currently    Alcohol/week: 0.0 standard drinks    Comment: "I stopped drinking 1 year ago"  . Drug use: No    Comment: Hx of polysubstance drug abuse     Allergies   Aspirin and Chlorpromazine hcl   Review of Systems Review of Systems  Constitutional: Positive for fatigue. Negative for chills and fever.  HENT: Negative for congestion  and rhinorrhea.   Eyes: Negative for visual disturbance.  Respiratory: Positive for cough, chest tightness, shortness of breath and wheezing.   Cardiovascular: Positive for palpitations. Negative for chest pain and leg swelling.  Gastrointestinal: Negative for abdominal pain, diarrhea, nausea and vomiting.  Genitourinary: Negative for dysuria and flank pain.  Musculoskeletal: Negative for neck pain and neck stiffness.  Skin: Negative for rash and wound.  Allergic/Immunologic: Negative for immunocompromised state.  Neurological: Positive for weakness. Negative for syncope and headaches.  All other systems reviewed and are negative.    Physical Exam Updated Vital Signs BP 98/64   Pulse (!) 141   Temp 98.5 F (36.9 C)   Resp 14   SpO2 97%   Physical Exam  Constitutional: She is oriented to person, place, and time. She appears well-developed and well-nourished. She appears distressed.  Cachectic  HENT:  Head: Normocephalic and atraumatic.  Dry MM  Eyes: Conjunctivae are normal.  Neck: Neck supple.  Cardiovascular: Regular rhythm and normal heart sounds. Tachycardia present. Exam reveals no friction rub.  No murmur heard. Pulmonary/Chest: Accessory muscle usage present. Tachypnea noted. No respiratory distress. She has decreased breath sounds. She has wheezes in the right upper field, the right middle field, the right lower field, the left upper field, the left middle field and the left lower field. She has no rales.  Abdominal: She exhibits no distension.  Musculoskeletal: She exhibits no edema.  Neurological: She is alert and oriented to person, place, and time. She exhibits normal muscle tone.  Skin: Skin is warm. Capillary refill takes less than 2 seconds.  Psychiatric: She has a normal mood and affect.  Nursing note and vitals reviewed.    ED Treatments / Results  Labs (all labs ordered are listed, but only abnormal results are displayed) Labs Reviewed  CBC WITH  DIFFERENTIAL/PLATELET - Abnormal; Notable for the following components:      Result Value   WBC 10.6 (*)    RBC 3.64 (*)    Hemoglobin 10.2 (*)    HCT 32.4 (*)    Neutro Abs 8.3 (*)    All other components within normal limits  BASIC METABOLIC PANEL - Abnormal; Notable for the following components:   Glucose, Bld 103 (*)    All other components within normal limits  BLOOD GAS, VENOUS - Abnormal; Notable for the following components:   pCO2, Ven 42.4 (*)    pO2, Ven 31.4 (*)    All  other components within normal limits    EKG EKG Interpretation  Date/Time:  Sunday October 23 2017 12:15:02 EDT Ventricular Rate:  111 PR Interval:    QRS Duration: 83 QT Interval:  331 QTC Calculation: 446 R Axis:   79 Text Interpretation:  Sinus tachycardia Biatrial enlargement Baseline wander in lead(s) II III aVF V5 No significant change since last tracing Confirmed by Duffy Bruce [phone]) on 10/23/2017 2:32:53 PM Also confirmed by Duffy Bruce [phone]), editor Shon Hale [phone])  on 10/23/2017 2:41:49 PM   Radiology Dg Chest 2 View  Result Date: 10/23/2017 CLINICAL DATA:  Anxiety.  Lung mass. EXAM: CHEST - 2 VIEW COMPARISON:  10/10/2017 FINDINGS: Cardiomediastinal silhouette is normal. Mediastinal contours appear intact. Calcific atherosclerotic disease of the aorta. There is no evidence of pneumothorax. Hyperinflation and upper lobe predominant emphysema. Known left lower lobe lung mass projects over the cardiac silhouette. Group pulmonary nodules in the right lower lobe also evident. Osseous structures are without acute abnormality. Soft tissues are grossly normal. IMPRESSION: Hyperinflation and upper lobe predominant emphysema. Known left lower lobe lung mass and right lower lobe cluster of pulmonary nodules are again seen. No evidence of pneumothorax. Calcific atherosclerotic disease of the aorta. Electronically Signed   By: Fidela Salisbury M.D.   On: 10/23/2017 13:03     Procedures .Critical Care Performed by: Duffy Bruce, MD Authorized by: Duffy Bruce, MD   Critical care provider statement:    Critical care time (minutes):  35   Critical care time was exclusive of:  Separately billable procedures and treating other patients and teaching time   Critical care was necessary to treat or prevent imminent or life-threatening deterioration of the following conditions:  Dehydration, circulatory failure and respiratory failure   Critical care was time spent personally by me on the following activities:  Development of treatment plan with patient or surrogate, discussions with consultants, evaluation of patient's response to treatment, examination of patient, obtaining history from patient or surrogate, ordering and performing treatments and interventions, ordering and review of laboratory studies, ordering and review of radiographic studies, pulse oximetry, re-evaluation of patient's condition and review of old charts   I assumed direction of critical care for this patient from another provider in my specialty: no     (including critical care time)  Medications Ordered in ED Medications  sodium chloride 0.9 % bolus 1,000 mL (1,000 mLs Intravenous New Bag/Given 10/23/17 1534)  sodium chloride 0.9 % injection (has no administration in time range)  iopamidol (ISOVUE-370) 76 % injection (has no administration in time range)  levalbuterol (XOPENEX) nebulizer solution 1.25 mg (has no administration in time range)  LORazepam (ATIVAN) injection 1 mg (1 mg Intravenous Given 10/23/17 1224)  sodium chloride 0.9 % bolus 1,000 mL (0 mLs Intravenous Stopped 10/23/17 1430)  LORazepam (ATIVAN) injection 1 mg (1 mg Intravenous Given 10/23/17 1325)  levalbuterol (XOPENEX) nebulizer solution 0.63 mg (0.63 mg Nebulization Given 10/23/17 1507)  oxyCODONE-acetaminophen (PERCOCET/ROXICET) 5-325 MG per tablet 1 tablet (1 tablet Oral Given 10/23/17 1531)  methylPREDNISolone  sodium succinate (SOLU-MEDROL) 125 mg/2 mL injection 125 mg (125 mg Intravenous Given 10/23/17 1534)  iopamidol (ISOVUE-370) 76 % injection 100 mL (70 mLs Intravenous Contrast Given 10/23/17 1629)     Initial Impression / Assessment and Plan / ED Course  I have reviewed the triage vital signs and the nursing notes.  Pertinent labs & imaging results that were available during my care of the patient were reviewed by me and considered in my medical  decision making (see chart for details).     67 year old female here with shortness of breath and increased anxiety.  I suspect there is a component of anxiety, patient was given multiple doses of Ativan and subsequently calmed, with persistent tachycardia in the 120s.  She does have increased work of breathing.  Blood gas is without retention, but given her persistent tachycardia and tachypnea, known COPD, patient given steroids and breathing treatment with some improvement.  Given her recent diagnosis of cancer, will obtain a CT angios given her out of proportion tachycardia, plan to give fluids.  Will likely need admission for COPD exacerbation with persistent tachypnea and tachycardia, once PE or pneumonia is evaluated on CT.  Final Clinical Impressions(s) / ED Diagnoses   Final diagnoses:  COPD exacerbation [facility])    ED Discharge Orders    None       Duffy Bruce, MD 10/23/17 1631    Duffy Bruce, MD 11/10/17 1156

## 2017-10-23 NOTE — ED Notes (Signed)
I have just phoned report to [facility], RN in ICU and I will transport pt. Now.

## 2017-10-23 NOTE — ED Notes (Signed)
Spoke to lab to add on ordered blood work

## 2017-10-23 NOTE — ED Triage Notes (Signed)
She tells [place] that she has been anxious for several hours and hasn't been able to improve.

## 2017-10-23 NOTE — Progress Notes (Signed)
Rx Brief note: Lovenox  Wt=31 kg, CrCl~35 ml/min  Rx adjusted Lovenox to 30 mg daily in pt with wt<45 kg  Thanks Dorrene German 10/23/2017 10:33 PM

## 2017-10-23 NOTE — H&P (Signed)
History and Physical    TAMEA BAI RCV:[id number] DOB: 1950-03-24 DOA: 10/23/2017  PCP: Azzie Glatter, FNP  Patient coming from: Home  I have personally briefly reviewed patient's old medical records in [place]  Chief Complaint: Shortness of breath  HPI: [name] is a 67 y.o. female with medical history significant of COPD,  Anxiety, hx of polysubstance, prior hx of alcohol abuse however patient denies any recent use, remote history of renal cell carcinoma status post radiofrequency ablation of left renal cell carcinoma January 2009 followed by cryoablation in May 2012 for local recurrence, recent history of left lower lobe mass per chest x-ray of 09/15/2017 being followed by oncology with pathology done over CT guided biopsy 10/10/2017 consistent with metastatic adenocarcinoma who presents to the ED with complaints of shortness of breath.  Patient also with multiple other complaints.  At the time of my interview patient stated that she was confused and not sure why she presented to the ED and as such history obtained from ED physician's note.  It is noted that over the last several weeks patient has had progressive worsening shortness of breath with a sensation that she could not breathe.  Patient seen in the ED a month prior to admission with similar symptoms and diagnosed with anxiety.  It is noted that patient recently learned that she possibly had metastatic cancer and has been having worsening shortness of breath since then.  Patient noted to have complaints of coughing as well as diffuse chest pain/tightness.  Is noted per ED physician the patient did not have any anxiolytics at home.  Patient due to see her psychiatrist tomorrow.  ED physician patient noted to have some expiratory wheezing and increased work of breathing as well as worsening shortness of breath.  [facility] were called to admit the patient for an acute COPD exacerbation.  ED Course: She is seen in  the ED, VBG pH of 7.389, PCO2 of 42, PO2 of 31, bicarb of 25, O2 sats of 57.  Basic metabolic profile obtained unremarkable.  CBC obtained had a white count of 10.6, hemoglobin of 10.2 otherwise was unremarkable.  Chest x-ray obtained showed hyperinflation and upper lobe predominant emphysema.  Known left lower lobe lung mass and right lower lobe cluster of pulmonary nodules again seen.  No evidence of pneumothorax.  Calcified atherosclerotic disease of the aorta.  CT angiogram chest which was done showed no definite evidence of pulmonary embolus.  3.2 cm left lower lobe mass noted which was recently biopsied.  Irregular density noted posteriorly in the right lower lobe which may represent postinfectious scarring as pneumonia was noted here previously.  Review of Systems: As per HPI otherwise 10 point review of systems negative.  Past Medical History:  Diagnosis Date  . Cancer ([place])    renal ca  . COPD (chronic obstructive pulmonary disease) ([place])   . Drug-seeking behavior   . Pancreatitis   . Pancreatitis   . Seizures ([place])   . Substance abuse [facility])     Past Surgical History:  Procedure Laterality Date  . HIP SURGERY Bilateral   . IR GENERIC HISTORICAL  05/08/2015   IR RADIOLOGIST EVAL & MGMT 05/08/2015 Aletta Edouard, MD [facility] INTERV RAD  . IR GENERIC HISTORICAL  01/08/2014   IR RADIOLOGIST EVAL & MGMT 01/08/2014 Aletta Edouard, MD [facility] INTERV RAD  . IR RADIOLOGY PERIPHERAL GUIDED IV START  10/03/2017  . IR RADIOLOGY PERIPHERAL GUIDED IV START  10/10/2017  . IR  US GUIDE VASC ACCESS RIGHT  10/05/2017  . IR US GUIDE VASC ACCESS RIGHT  10/10/2017  . KIDNEY SURGERY     removed cancerous lesions  . PARTIAL GASTRECTOMY       reports that she has been smoking cigarettes. She has a 15.00 pack-year smoking history. She has never used smokeless tobacco. She reports that she drank alcohol. She reports that she does not use drugs.  Allergies  Allergen Reactions  . Aspirin Other (See Comments)     Break out in welts  . Chlorpromazine Hcl Other (See Comments)    Muscle spasms - "really bad"    Family History  Problem Relation Age of Onset  . CAD Mother   . Alcoholism Father   . COPD Father   . Lung cancer Father   . Rectal cancer Maternal Uncle    Reviewed in chart.  Patient unable to provide information as she is feeling somewhat confused at the time of my interview.  Prior to Admission medications   Medication Sig Start Date End Date Taking? Authorizing Provider  ALPRAZolam (XANAX) 0.25 MG tablet Take 1 tablet (0.25 mg total) by mouth 3 (three) times daily as needed for anxiety. Patient not taking: Reported on 10/12/2017 10/01/17   Charlesetta Shanks, MD  amoxicillin-clavulanate (AUGMENTIN) 875-125 MG tablet Take 1 tablet by mouth 2 (two) times daily. One po bid x 7 days Patient not taking: Reported on 10/10/2017 10/01/17   Charlesetta Shanks, MD  busPIRone (BUSPAR) 10 MG tablet Take 10 mg by mouth 3 (three) times daily as needed (anxiety).    [provider]  famotidine (PEPCID) 20 MG tablet Take 1 tablet (20 mg total) by mouth daily. Patient taking differently: Take 20 mg by mouth daily as needed for heartburn or indigestion.  08/28/17   Khatri, Hina, PA-C  hydrOXYzine (ATARAX/VISTARIL) 25 MG tablet Take 1 tablet (25 mg total) by mouth every 6 (six) hours as needed for anxiety. Patient not taking: Reported on 10/12/2017 10/06/17   Frederica Kuster, PA-C  lidocaine (LIDODERM) 5 % Place 1 patch onto the skin daily. Remove & Discard patch within 12 hours or as directed by MD Patient not taking: Reported on 10/01/2017 09/25/17   Dana Allan I, MD  losartan (COZAAR) 50 MG tablet Take 50 mg by mouth daily.    [provider]  mirabegron ER (MYRBETRIQ) 25 MG TB24 tablet Take 1 tablet (25 mg total) by mouth daily. Patient not taking: Reported on 10/01/2017 09/25/17 10/25/17  Dana Allan I, MD  mometasone-formoterol [facility]) 200-5 MCG/ACT AERO Inhale 2 puffs into the  lungs 2 (two) times daily. Patient taking differently: Inhale 3 puffs into the lungs daily.  08/08/17   Azzie Glatter, FNP  Multiple Vitamin (MULTIVITAMIN) LIQD Take 15 mLs by mouth daily. Patient not taking: Reported on 10/01/2017 09/25/17 10/25/17  Dana Allan I, MD  ondansetron [facility]) 4 MG tablet Take 4 mg by mouth daily as needed for nausea/vomiting. 09/26/17   [provider]  polyethylene glycol powder (GLYCOLAX/MIRALAX) powder Mix 1 capful of powder into drink and take by mouth 1-3 times daily until daily soft stools OTC Patient not taking: Reported on 10/01/2017 08/30/17   Little, Wenda Overland, MD  PROAIR HFA 108 [phone] Base) MCG/ACT inhaler Inhale 1 puff into the lungs every 4 (four) hours as needed for wheezing or shortness of breath.  08/02/17   [provider]  SPIRIVA HANDIHALER 18 MCG inhalation capsule PLACE 1 CAPSULE INTO INHALER AND INHALE DAILY Patient  taking differently: Place 18 mcg into inhaler and inhale daily as needed (shortness of breath).  09/08/17   Azzie Glatter, FNP  SUBOXONE 8-2 MG FILM Take 1 Film by mouth every 12 (twelve) hours. 09/30/17   [provider]  tamsulosin (FLOMAX) 0.4 MG CAPS capsule Take 1 capsule (0.4 mg total) by mouth daily. Patient not taking: Reported on 10/10/2017 09/25/17   Bonnell Public, MD    Physical Exam: Vitals:   10/23/17 1500 10/23/17 1507 10/23/17 1600 10/23/17 1700  BP: 98/64  94/83 115/72  Pulse: (!) 141  99 (!) 103  Resp: 14  (!) 23 (!) 31  Temp:      SpO2: 98% 97% 98% 100%    Constitutional: Diffuse tremors.  Frail.  Cachectic. Vitals:   10/23/17 1500 10/23/17 1507 10/23/17 1600 10/23/17 1700  BP: 98/64  94/83 115/72  Pulse: (!) 141  99 (!) 103  Resp: 14  (!) 23 (!) 31  Temp:      SpO2: 98% 97% 98% 100%   Eyes: PERRLA, lids and conjunctivae normal ENMT: Mucous membranes are moist. Posterior pharynx clear of any exudate or lesions.Normal dentition.  Neck: normal, supple, no masses, no  thyromegaly Respiratory: Some expiratory wheezing, some coarse breath sounds.  No crackles.  Speaking in full sentences.  No use of accessory muscles of respiration.  Diffuse tremors.   Cardiovascular: Tachycardia.  No murmurs rubs or gallops.  No lower extremity edema.  No JVD.  Abdomen: no tenderness, no masses palpated. No hepatosplenomegaly. Bowel sounds positive.  Musculoskeletal: no clubbing / cyanosis. No joint deformity upper and lower extremities. Good ROM, no contractures. Normal muscle tone.  Skin: no rashes, lesions, ulcers. No induration Neurologic: CN 2-12 grossly intact. Sensation intact, DTR normal. Strength 5/5 in all 4.  Psychiatric: Poor judgment and insight. Alert and oriented x 3. Normal mood.    Labs on Admission: I have personally reviewed following labs and imaging studies  CBC: Recent Labs  Lab 10/19/17 0748 10/23/17 1227  WBC 10.6* 10.6*  NEUTROABS 8.0* 8.3*  HGB 9.8* 10.2*  HCT 30.9* 32.4*  MCV 88.0 89.0  PLT 247 267   Basic Metabolic Panel: Recent Labs  Lab 10/23/17 1227  NA 137  K 4.2  CL 101  CO2 25  GLUCOSE 103*  BUN 9  CREATININE 0.70  CALCIUM 9.7   GFR: Estimated Creatinine Clearance: 35.6 mL/min (by C-G formula based on SCr of 0.7 mg/dL). Liver Function Tests: No results for input(s): AST, ALT, ALKPHOS, BILITOT, PROT, ALBUMIN in the last 168 hours. No results for input(s): LIPASE, AMYLASE in the last 168 hours. No results for input(s): AMMONIA in the last 168 hours. Coagulation Profile: No results for input(s): INR, PROTIME in the last 168 hours. Cardiac Enzymes: No results for input(s): CKTOTAL, CKMB, CKMBINDEX, TROPONINI in the last 168 hours. BNP (last 3 results) No results for input(s): PROBNP in the last 8760 hours. HbA1C: No results for input(s): HGBA1C in the last 72 hours. CBG: No results for input(s): GLUCAP in the last 168 hours. Lipid Profile: No results for input(s): CHOL, HDL, LDLCALC, TRIG, CHOLHDL, LDLDIRECT in the  last 72 hours. Thyroid Function Tests: No results for input(s): TSH, T4TOTAL, FREET4, T3FREE, THYROIDAB in the last 72 hours. Anemia Panel: No results for input(s): VITAMINB12, FOLATE, FERRITIN, TIBC, IRON, RETICCTPCT in the last 72 hours. Urine analysis:    Component Value Date/Time   COLORURINE YELLOW 10/06/2017 0936   APPEARANCEUR CLEAR 10/06/2017 0936   LABSPEC 1.014  10/06/2017 0936   PHURINE 7.0 10/06/2017 0936   GLUCOSEU NEGATIVE 10/06/2017 0936   HGBUR MODERATE (A) 10/06/2017 0936   BILIRUBINUR NEGATIVE 10/06/2017 0936   BILIRUBINUR negative 06/03/2017 1120   KETONESUR NEGATIVE 10/06/2017 0936   PROTEINUR NEGATIVE 10/06/2017 0936   UROBILINOGEN 0.2 06/03/2017 1120   UROBILINOGEN 0.2 03/15/2017 1004   NITRITE NEGATIVE 10/06/2017 0936   LEUKOCYTESUR TRACE (A) 10/06/2017 0936    Radiological Exams on Admission: Dg Chest 2 View  Result Date: 10/23/2017 CLINICAL DATA:  Anxiety.  Lung mass. EXAM: CHEST - 2 VIEW COMPARISON:  10/10/2017 FINDINGS: Cardiomediastinal silhouette is normal. Mediastinal contours appear intact. Calcific atherosclerotic disease of the aorta. There is no evidence of pneumothorax. Hyperinflation and upper lobe predominant emphysema. Known left lower lobe lung mass projects over the cardiac silhouette. Group pulmonary nodules in the right lower lobe also evident. Osseous structures are without acute abnormality. Soft tissues are grossly normal. IMPRESSION: Hyperinflation and upper lobe predominant emphysema. Known left lower lobe lung mass and right lower lobe cluster of pulmonary nodules are again seen. No evidence of pneumothorax. Calcific atherosclerotic disease of the aorta. Electronically Signed   By: Fidela Salisbury M.D.   On: 10/23/2017 13:03   Ct Angio Chest Pe W And/or Wo Contrast  Result Date: 10/23/2017 CLINICAL DATA:  Chest pain, shortness of breath. EXAM: CT ANGIOGRAPHY CHEST WITH CONTRAST TECHNIQUE: Multidetector CT imaging of the chest was  performed using the standard protocol during bolus administration of intravenous contrast. Multiplanar CT image reconstructions and MIPs were obtained to evaluate the vascular anatomy. CONTRAST:  9mL ISOVUE-370 IOPAMIDOL (ISOVUE-370) INJECTION 76% COMPARISON:  CT scan of September 18, 2017. FINDINGS: Cardiovascular: Satisfactory opacification of the pulmonary arteries to the segmental level. No evidence of pulmonary embolism. Normal heart size. No pericardial effusion. Atherosclerosis of thoracic aorta is noted without aneurysm or dissection. Mediastinum/Nodes: No enlarged mediastinal, hilar, or axillary lymph nodes. Thyroid gland, trachea, and esophagus demonstrate no significant findings. Lungs/Pleura: No pneumothorax or pleural effusion is noted. 3.2 cm left lower lobe mass is noted medially which was recently biopsied. Irregular density is noted posteriorly in the right lower lobe which may represent postinfectious scarring. Upper Abdomen: No acute abnormality. Musculoskeletal: No chest wall abnormality. No acute or significant osseous findings. Review of the MIP images confirms the above findings. IMPRESSION: No definite evidence of pulmonary embolus. 3.2 cm left lower lobe mass is noted which was recently biopsied. Irregular density is noted posteriorly in the right lower lobe which may represent postinfectious scarring as pneumonia was noted here previously. Aortic Atherosclerosis (ICD10-I70.0). Electronically Signed   By: Marijo Conception, M.D.   On: 10/23/2017 17:19    EKG: Independently reviewed.  Sinus tachycardia  Assessment/Plan Principal Problem:   COPD with acute exacerbation (HCC) Active Problems:   Anxiety state   GERD   GASTRIC ULCER, HX OF   Thrombocytopenia (HCC)   HTN (hypertension), malignant   Hypokalemia   Protein-calorie malnutrition, severe (HCC)   Tobacco abuse   Failure to thrive in adult   Lung mass   1 probable acute COPD exacerbation Patient presented with  worsening shortness of breath, tachycardia, tachypnea, expiratory wheezing noted on examination.  Patient with recently diagnosed lung mass.  Patient also anxious.  Patient given IV Solu-Medrol as well as nebulizer treatments in the ED however still with expiratory wheezing per the ED physician and still tachycardic and tachypneic.  Will place patient in observation.  Place patient on the IV steroid taper, scheduled Pulmicort, scheduled duo  nebs.  Placed on Claritin, Flonase, Pepcid, IV Rocephin..  Supportive care.  Follow.  2.  Generalized anxiety disorder It is noted per ED physician's note that patient did not have any anxiolytics at home.  Patient with diffuse tremors.  Patient with recent diagnosis of a lung mass.  Per med rec patient is noted to be on multiple anxiolytics.  Will place patient on Xanax as needed.  Follow for now.  3.  History of gastric ulcer disease/gerd Resume home regimen of Pepcid.   4.  Severe protein calorie malnutrition/failure to thrive Likely secondary to recently diagnosed lung mass versus COPD.  Place on nutritional supplementation.  5.  Tobacco abuse Tobacco cessation.  Place on a nicotine patch.  6.  Lung mass Status post CT-guided biopsy 10/10/2017 consistent with metastatic adenocarcinoma.  Outpatient follow-up with oncology.  7.  Hypertension BP stable.  Follow.   DVT prophylaxis: Lovenox Code Status: Full Family Communication: Updated patient.  No family at bedside. Disposition Plan: Pending evaluation likely home when medically stable and clinically improved. Consults called: None Admission status: Place in observation.   Irine Seal MD [facility] Pager [phone]  If 7PM-7AM, please contact night-coverage [email or web address] Password [facility]  10/23/2017, 6:32 PM

## 2017-10-23 NOTE — ED Notes (Signed)
ED TO INPATIENT HANDOFF REPORT  Name/Age/Gender [name] 67 y.o. female  Code Status Code Status History    Date Active Date Inactive Code Status Order ID Comments User Context   09/17/2017 1540 09/24/2017 1724 Full Code [id number]  Nita Sells, MD ED   06/21/2017 2201 06/28/2017 1543 Full Code [id number]  Bethena Roys, MD Inpatient   04/03/2017 1617 04/11/2017 [place] Full Code [id number]  Georgette Shell, MD ED   04/03/2017 1207 04/03/2017 1617 Full Code [id number]  Renita Papa, PA-C ED   02/28/2016 1050 02/29/2016 1439 Full Code [id number]  Annita Brod, MD Inpatient   01/03/2015 1440 01/05/2015 1428 Full Code [id number]  Theodis Blaze, MD Inpatient   07/03/2014 1716 07/05/2014 1540 Full Code [id number]  Cristal Ford, DO Inpatient   06/02/2013 0029 06/06/2013 1456 Full Code [id number]  Etta Quill, DO ED   03/28/2013 1941 04/01/2013 1925 Full Code [id number]  Marybelle Killings, MD Inpatient   03/28/2013 0550 03/28/2013 1941 Full Code [id number]  Berle Mull, MD ED   03/09/2013 2235 03/11/2013 2210 Full Code [id number]  Toy Baker, MD ED   02/09/2013 0247 02/11/2013 1915 Full Code [id number]  Etta Quill, DO ED      Home/SNF/Other Home  Chief Complaint anxiety  Level of Care/Admitting Diagnosis ED Disposition    ED Disposition Condition [place]: [facility] [100102]  Level of Care: Telemetry [5]  Admit to tele based on following criteria: Complex arrhythmia (Bradycardia/Tachycardia)  Diagnosis: COPD with acute exacerbation [facility]) [[id number]]  Admitting Physician: Eugenie Filler [[id number]]  Attending Physician: Eugenie Filler [[id number]]  PT Class (Do Not Modify): Observation [104]  PT Acc Code (Do Not Modify): Observation [10022]       Medical History Past Medical History:  Diagnosis Date  . Cancer ([place])    renal ca  . COPD (chronic obstructive pulmonary disease) ([place])   . Drug-seeking behavior   . Pancreatitis    . Pancreatitis   . Seizures ([place])   . Substance abuse ([facility])     Allergies Allergies  Allergen Reactions  . Aspirin Other (See Comments)    Break out in welts  . Chlorpromazine Hcl Other (See Comments)    Muscle spasms - "really bad"    IV Location/Drains/Wounds Patient Lines/Drains/Airways Status   Active Line/Drains/Airways    Name:   Placement date:   Placement time:   Site:   Days:   Peripheral IV 10/23/17 Left;Anterior Other (Comment)   10/23/17    1222    Other (Comment)   less than 1   Peripheral IV 10/23/17 Right;Upper Arm   10/23/17    1535    Arm   less than 1   Incision (Closed) 10/10/17 Back Left;Lower   10/10/17    1200     13          Labs/Imaging Results for orders placed or performed during the hospital encounter of 10/23/17 (from the past 48 hour(s))  Blood gas, venous     Status: Abnormal   Collection Time: 10/23/17 12:16 PM  Result Value Ref Range   pH, Ven 7.389 7.250 - 7.430   pCO2, Ven 42.4 (L) 44.0 - 60.0 mmHg   pO2, Ven 31.4 (LL) 32.0 - 45.0 mmHg    Comment: CRITICAL RESULT CALLED TO, READ BACK BY AND VERIFIED WITH: RN Abrar Koone AT 1227 BY DEE WALTERS RRT ON 10/23/2017    Bicarbonate 25.1 20.0 - 28.0 mmol/L  Acid-Base Excess 0.5 0.0 - 2.0 mmol/L   O2 Saturation 56.8 %   Patient temperature 98.6    Collection site DRAWN BY RN    Drawn by DRAWN BY RN    Sample type VEIN     Comment: Performed at [facility], [place] [address] [place], [place] [address]  CBC with Differential     Status: Abnormal   Collection Time: 10/23/17 12:27 PM  Result Value Ref Range   WBC 10.6 (H) 4.0 - 10.5 K/uL   RBC 3.64 (L) 3.87 - 5.11 MIL/uL   Hemoglobin 10.2 (L) 12.0 - 15.0 g/dL   HCT 32.4 (L) 36.0 - 46.0 %   MCV 89.0 80.0 - 100.0 fL   MCH 28.0 26.0 - 34.0 pg   MCHC 31.5 30.0 - 36.0 g/dL   RDW 15.4 11.5 - 15.5 %   Platelets 310 150 - 400 K/uL   nRBC 0.0 0.0 - 0.2 %   Neutrophils Relative % 77 %   Neutro Abs 8.3 (H) 1.7 - 7.7 K/uL   Lymphocytes  Relative 15 %   Lymphs Abs 1.6 0.7 - 4.0 K/uL   Monocytes Relative 6 %   Monocytes Absolute 0.6 0.1 - 1.0 K/uL   Eosinophils Relative 1 %   Eosinophils Absolute 0.1 0.0 - 0.5 K/uL   Basophils Relative 1 %   Basophils Absolute 0.1 0.0 - 0.1 K/uL   Immature Granulocytes 0 %   Abs Immature Granulocytes 0.03 0.00 - 0.07 K/uL    Comment: Performed at [facility], [place] [address], [place], [place] [address]  Basic metabolic panel     Status: Abnormal   Collection Time: 10/23/17 12:27 PM  Result Value Ref Range   Sodium 137 135 - 145 mmol/L   Potassium 4.2 3.5 - 5.1 mmol/L   Chloride 101 98 - 111 mmol/L   CO2 25 22 - 32 mmol/L   Glucose, Bld 103 (H) 70 - 99 mg/dL   BUN 9 8 - 23 mg/dL   Creatinine, Ser 0.70 0.44 - 1.00 mg/dL   Calcium 9.7 8.9 - 10.3 mg/dL   GFR calc non Af Amer >60 >60 mL/min   GFR calc Af Amer >60 >60 mL/min    Comment: (NOTE) The eGFR has been calculated using the CKD EPI equation. This calculation has not been validated in all clinical situations. eGFR's persistently <60 mL/min signify possible Chronic Kidney Disease.    Anion gap 11 5 - 15    Comment: Performed at [facility], [place] [address] [place], [place] [address]   Dg Chest 2 View  Result Date: 10/23/2017 CLINICAL DATA:  Anxiety.  Lung mass. EXAM: CHEST - 2 VIEW COMPARISON:  10/10/2017 FINDINGS: Cardiomediastinal silhouette is normal. Mediastinal contours appear intact. Calcific atherosclerotic disease of the aorta. There is no evidence of pneumothorax. Hyperinflation and upper lobe predominant emphysema. Known left lower lobe lung mass projects over the cardiac silhouette. Group pulmonary nodules in the right lower lobe also evident. Osseous structures are without acute abnormality. Soft tissues are grossly normal. IMPRESSION: Hyperinflation and upper lobe predominant emphysema. Known left lower lobe lung mass and right lower lobe cluster of pulmonary nodules are again  seen. No evidence of pneumothorax. Calcific atherosclerotic disease of the aorta. Electronically Signed   By: Fidela Salisbury M.D.   On: 10/23/2017 13:03   Ct Angio Chest Pe W And/or Wo Contrast  Result Date: 10/23/2017 CLINICAL DATA:  Chest pain, shortness of breath. EXAM: CT  ANGIOGRAPHY CHEST WITH CONTRAST TECHNIQUE: Multidetector CT imaging of the chest was performed using the standard protocol during bolus administration of intravenous contrast. Multiplanar CT image reconstructions and MIPs were obtained to evaluate the vascular anatomy. CONTRAST:  87m ISOVUE-370 IOPAMIDOL (ISOVUE-370) INJECTION 76% COMPARISON:  CT scan of September 18, 2017. FINDINGS: Cardiovascular: Satisfactory opacification of the pulmonary arteries to the segmental level. No evidence of pulmonary embolism. Normal heart size. No pericardial effusion. Atherosclerosis of thoracic aorta is noted without aneurysm or dissection. Mediastinum/Nodes: No enlarged mediastinal, hilar, or axillary lymph nodes. Thyroid gland, trachea, and esophagus demonstrate no significant findings. Lungs/Pleura: No pneumothorax or pleural effusion is noted. 3.2 cm left lower lobe mass is noted medially which was recently biopsied. Irregular density is noted posteriorly in the right lower lobe which may represent postinfectious scarring. Upper Abdomen: No acute abnormality. Musculoskeletal: No chest wall abnormality. No acute or significant osseous findings. Review of the MIP images confirms the above findings. IMPRESSION: No definite evidence of pulmonary embolus. 3.2 cm left lower lobe mass is noted which was recently biopsied. Irregular density is noted posteriorly in the right lower lobe which may represent postinfectious scarring as pneumonia was noted here previously. Aortic Atherosclerosis (ICD10-I70.0). Electronically Signed   By: JMarijo Conception M.D.   On: 10/23/2017 17:19   EKG Interpretation  Date/Time:  _0 /20/19 1814          Vitals/Pain Today's Vitals   10/23/17 1500 10/23/17 1507 10/23/17 1600 10/23/17 1700  BP: 98/64  94/83 115/72  Pulse: (!) 141  99 (!) 103  Resp: 14  (!) 23 (!) 31  Temp:      SpO2: 98% 97% 98% 100%  PainSc:        Isolation Precautions No active isolations  Medications Medications  sodium chloride 0.9 % injection (has no administration in time range)  iopamidol (ISOVUE-370) 76 % injection (has no administration in time range)  LORazepam (ATIVAN) injection 1 mg (1 mg Intravenous Given 10/23/17 1224)  sodium chloride 0.9 % bolus 1,000 mL (0 mLs Intravenous Stopped 10/23/17 1430)  LORazepam (ATIVAN) injection 1 mg (1 mg Intravenous Given 10/23/17 1325)  levalbuterol (XOPENEX) nebulizer solution 0.63 mg (0.63 mg Nebulization  Given 10/23/17 1507)  sodium chloride 0.9 % bolus 1,000 mL (0 mLs Intravenous Stopped 10/23/17 1800)  oxyCODONE-acetaminophen (PERCOCET/ROXICET) 5-325 MG per tablet 1 tablet (1 tablet  Oral Given 10/23/17 1531)  methylPREDNISolone sodium succinate (SOLU-MEDROL) 125 mg/2 mL injection 125 mg (125 mg Intravenous Given 10/23/17 1534)  iopamidol (ISOVUE-370) 76 % injection 100 mL (70 mLs Intravenous Contrast Given 10/23/17 1629)  levalbuterol (XOPENEX) nebulizer solution 1.25 mg (1.25 mg Nebulization Given 10/23/17 1658)  LORazepam (ATIVAN) injection 1 mg (1 mg Intravenous Given 10/23/17 1744)    Mobility

## 2017-10-23 NOTE — ED Notes (Signed)
This nurse assisted pt to the restroom via wheelchair with no difficulty. Informed her that she would be moving to the fourth floor shortly. Pt states that they better do something to fix her or she will leave, states she will just [facility] to [facility] to get the medication she needs.

## 2017-10-24 DIAGNOSIS — R627 Adult failure to thrive: Secondary | ICD-10-CM | POA: Diagnosis not present

## 2017-10-24 DIAGNOSIS — J441 Chronic obstructive pulmonary disease with (acute) exacerbation: Secondary | ICD-10-CM | POA: Diagnosis not present

## 2017-10-24 DIAGNOSIS — F411 Generalized anxiety disorder: Secondary | ICD-10-CM | POA: Diagnosis not present

## 2017-10-24 DIAGNOSIS — K219 Gastro-esophageal reflux disease without esophagitis: Secondary | ICD-10-CM | POA: Diagnosis not present

## 2017-10-24 LAB — MAGNESIUM: MAGNESIUM: 1.6 mg/dL — AB (ref 1.7–2.4)

## 2017-10-24 LAB — CBC WITH DIFFERENTIAL/PLATELET
Abs Immature Granulocytes: 0.02 10*3/uL (ref 0.00–0.07)
BASOS PCT: 0 %
Basophils Absolute: 0 10*3/uL (ref 0.0–0.1)
EOS ABS: 0 10*3/uL (ref 0.0–0.5)
EOS PCT: 0 %
HEMATOCRIT: 30.7 % — AB (ref 36.0–46.0)
Hemoglobin: 9 g/dL — ABNORMAL LOW (ref 12.0–15.0)
Immature Granulocytes: 0 %
LYMPHS ABS: 0.7 10*3/uL (ref 0.7–4.0)
Lymphocytes Relative: 13 %
MCH: 27.2 pg (ref 26.0–34.0)
MCHC: 29.3 g/dL — AB (ref 30.0–36.0)
MCV: 92.7 fL (ref 80.0–100.0)
MONOS PCT: 1 %
Monocytes Absolute: 0 10*3/uL — ABNORMAL LOW (ref 0.1–1.0)
NRBC: 0 % (ref 0.0–0.2)
Neutro Abs: 4.5 10*3/uL (ref 1.7–7.7)
Neutrophils Relative %: 86 %
PLATELETS: 175 10*3/uL (ref 150–400)
RBC: 3.31 MIL/uL — ABNORMAL LOW (ref 3.87–5.11)
RDW: 15.6 % — AB (ref 11.5–15.5)
WBC: 5.2 10*3/uL (ref 4.0–10.5)

## 2017-10-24 LAB — COMPREHENSIVE METABOLIC PANEL
ALK PHOS: 71 U/L (ref 38–126)
ALT: 12 U/L (ref 0–44)
ANION GAP: 7 (ref 5–15)
AST: 18 U/L (ref 15–41)
Albumin: 3.3 g/dL — ABNORMAL LOW (ref 3.5–5.0)
BILIRUBIN TOTAL: 0 mg/dL — AB (ref 0.3–1.2)
BUN: 14 mg/dL (ref 8–23)
CALCIUM: 8.7 mg/dL — AB (ref 8.9–10.3)
CO2: 21 mmol/L — AB (ref 22–32)
CREATININE: 0.7 mg/dL (ref 0.44–1.00)
Chloride: 109 mmol/L (ref 98–111)
GFR calc non Af Amer: >60 mL/min (ref >60)
GLUCOSE: 141 mg/dL — AB (ref 70–99)
Potassium: 4.5 mmol/L (ref 3.5–5.1)
Sodium: 137 mmol/L (ref 135–145)
TOTAL PROTEIN: 6.4 g/dL — AB (ref 6.5–8.1)

## 2017-10-24 LAB — LIPASE, BLOOD: LIPASE: 22 U/L (ref 11–51)

## 2017-10-24 LAB — GLUCOSE, CAPILLARY
Glucose-Capillary: 134 mg/dL — ABNORMAL HIGH (ref 70–99)
Glucose-Capillary: 171 mg/dL — ABNORMAL HIGH (ref 70–99)

## 2017-10-24 LAB — TSH: TSH: 0.324 u[IU]/mL — ABNORMAL LOW (ref 0.350–4.500)

## 2017-10-24 LAB — ETHANOL: Alcohol, Ethyl (B): <10 mg/dL (ref <10)

## 2017-10-24 MED ORDER — ENSURE ENLIVE PO LIQD
237.0000 mL | Freq: Three times a day (TID) | ORAL | Status: DC
  Administered 2017-10-24: 237 mL via ORAL

## 2017-10-24 MED ORDER — ADULT MULTIVITAMIN W/MINERALS CH
1.0000 | ORAL_TABLET | Freq: Every day | ORAL | Status: DC
  Administered 2017-10-24 – 2017-10-25 (×2): 1 via ORAL
  Filled 2017-10-24 (×2): qty 1

## 2017-10-24 MED ORDER — PROCHLORPERAZINE EDISYLATE 10 MG/2ML IJ SOLN
10.0000 mg | Freq: Four times a day (QID) | INTRAMUSCULAR | Status: DC | PRN
  Administered 2017-10-24 – 2017-10-25 (×3): 10 mg via INTRAVENOUS
  Filled 2017-10-24 (×3): qty 2

## 2017-10-24 MED ORDER — LORAZEPAM 0.5 MG PO TABS
0.5000 mg | ORAL_TABLET | Freq: Three times a day (TID) | ORAL | Status: DC | PRN
  Administered 2017-10-24 – 2017-10-25 (×3): 0.5 mg via ORAL
  Filled 2017-10-24 (×3): qty 1

## 2017-10-24 MED ORDER — LEVALBUTEROL HCL 0.63 MG/3ML IN NEBU
0.6300 mg | INHALATION_SOLUTION | Freq: Three times a day (TID) | RESPIRATORY_TRACT | Status: DC
  Administered 2017-10-24 – 2017-10-25 (×2): 0.63 mg via RESPIRATORY_TRACT
  Filled 2017-10-24 (×3): qty 3

## 2017-10-24 MED ORDER — IPRATROPIUM BROMIDE 0.02 % IN SOLN
0.5000 mg | Freq: Three times a day (TID) | RESPIRATORY_TRACT | Status: DC
  Administered 2017-10-24 – 2017-10-25 (×2): 0.5 mg via RESPIRATORY_TRACT
  Filled 2017-10-24 (×3): qty 2.5

## 2017-10-24 MED ORDER — DOXYCYCLINE HYCLATE 100 MG PO TABS
100.0000 mg | ORAL_TABLET | Freq: Two times a day (BID) | ORAL | Status: DC
  Administered 2017-10-24 – 2017-10-25 (×3): 100 mg via ORAL
  Filled 2017-10-24 (×3): qty 1

## 2017-10-24 MED ORDER — BUSPIRONE HCL 5 MG PO TABS
5.0000 mg | ORAL_TABLET | Freq: Two times a day (BID) | ORAL | Status: DC
  Filled 2017-10-24: qty 1

## 2017-10-24 MED ORDER — ALPRAZOLAM 0.5 MG PO TABS
0.5000 mg | ORAL_TABLET | Freq: Three times a day (TID) | ORAL | Status: DC | PRN
  Administered 2017-10-24: 0.5 mg via ORAL
  Filled 2017-10-24: qty 1

## 2017-10-24 MED ORDER — MAGNESIUM SULFATE 4 GM/100ML IV SOLN
4.0000 g | Freq: Once | INTRAVENOUS | Status: AC
  Administered 2017-10-24: 4 g via INTRAVENOUS
  Filled 2017-10-24: qty 100

## 2017-10-24 MED ORDER — LORAZEPAM 1 MG PO TABS: 1.0000 mg | ORAL_TABLET | Freq: Three times a day (TID) | ORAL | Status: DC | PRN

## 2017-10-24 MED ORDER — LORAZEPAM 0.5 MG PO TABS: 0.5000 mg | ORAL_TABLET | Freq: Three times a day (TID) | ORAL | Status: DC | PRN

## 2017-10-24 NOTE — Progress Notes (Signed)
Initial Nutrition Assessment  DOCUMENTATION CODES:   Severe malnutrition in context of chronic illness, Underweight  INTERVENTION:    Ensure Enlive po TID, each supplement provides 350 kcal and 20 grams of protein  Provide MVI daily  NUTRITION DIAGNOSIS:   Severe Malnutrition related to chronic illness, cancer and cancer related treatments(COPD) as evidenced by energy intake < or equal to 75% for > or equal to 1 month, severe fat depletion, severe muscle depletion.  GOAL:   Patient will meet greater than or equal to 90% of their needs  MONITOR:   PO intake, Supplement acceptance, Labs, Weight trends, I & O's  REASON FOR ASSESSMENT:   Consult COPD Protocol  ASSESSMENT:   Patient with PMH significant for COPD, anxiety, polysubstance abuse, alcohol abuse, renal cell carcinoma s/p radiofrequency ablation, seizures, pancreatitis, recent left lower lobe mass (9/12) and recent biopsy (10/7) consistent with metastatic adenocarcinoma. Presents this admission with probable acute COPD exacerbation.    Pt endorses having a chronic loss in appetite due to difficulty breathing. States sometimes she can go 1-2 weeks without eating anything at all. If she forces her self to eat she can tolerate one meal a day that consist of a protein/vegetable. She tries to drink one Ensure daily and drinks multiple cans of Coke. RD attempted to discuss the importance of protein but pt states "I can't have a conversation right now. I want my pain medication and I'm hot." RD offered supplements to which pt was amendable. RD observed bedside tray with a couple bites consumed from breakfast tray.   Pt unsure of her UBW or if she has lost weight. Records indicate pt weighed 72 lb on 10/04/17 and 70 lb on 10/17/17 (2.7% wt loss in 2 weeks, insignificant for time frame). Will need to obtain recent wt to determine if pt has lost any further. Nutrition-Focused physical exam completed.   Medications reviewed and  include: solu-medrol Labs reviewed: corrected calcium 9.3 (wdl), Mg 1.6 (L)   NUTRITION - FOCUSED PHYSICAL EXAM:    Most Recent Value  Orbital Region  Severe depletion  Upper Arm Region  Severe depletion  Thoracic and Lumbar Region  Severe depletion  Buccal Region  Severe depletion  Temple Region  Severe depletion  Clavicle Bone Region  Severe depletion  Clavicle and Acromion Bone Region  Severe depletion  Scapular Bone Region  Severe depletion  Dorsal Hand  Severe depletion  Patellar Region  Severe depletion  Anterior Thigh Region  Severe depletion  Posterior Calf Region  Severe depletion  Edema (RD Assessment)  None     Diet Order:   Diet Order            Diet regular Room service appropriate? Yes; Fluid consistency: Thin  Diet effective now              EDUCATION NEEDS:   Education needs have been addressed  Skin:  Skin Assessment: Reviewed RN Assessment  Last BM:  10/23/17  Height:   Ht Readings from Last 1 Encounters:  10/17/17 5\' 4"  (1.626 m)    Weight:   Wt Readings from Last 1 Encounters:  10/17/17 31.7 kg    Ideal Body Weight:  54.5 kg  BMI:  There is no height or weight on file to calculate BMI.  Estimated Nutritional Needs:   Kcal:  1200-1400 kcal  Protein:  60-75 grams  Fluid:  >/= 1.2 L/day  Mariana Single RD, LDN Clinical Nutrition Pager # - [phone]

## 2017-10-24 NOTE — Progress Notes (Signed)
PT Cancellation Note  Patient Details Name: [name] MRN: [id number] DOB: [birth year]   Cancelled Treatment:    Reason Eval/Treat Not Completed: Other (comment) Per OT, pt very anxious at this time and not willing to perform further mobility.  Will check back as schedule permits.    Mell Guia,KATHrine E 10/24/2017, 2:51 PM Carmelia Bake, PT, DPT Acute Rehabilitation Services Office: [phone] Pager: [phone]

## 2017-10-24 NOTE — Evaluation (Signed)
Occupational Therapy Evaluation Patient Details Name: [name] MRN: [id number] DOB: 12-02-1950 Today's Date: 10/24/2017    History of Present Illness [name] is a 67 y.o. female with medical history significant of COPD,  Anxiety, hx of polysubstance, prior hx of alcohol abuse however patient denies any recent use, remote history of renal cell carcinoma status post radiofrequency ablation of left renal cell carcinoma January 2009 followed by cryoablation in May 2012 for local recurrence, recent history of left lower lobe mass per chest x-ray of 09/15/2017 being followed by oncology with pathology done over CT guided biopsy 10/10/2017 consistent with metastatic adenocarcinoma who presents to the ED with complaints of shortness of breath.  Patient also with multiple other complaints.  At the time of my interview patient stated that she was confused and not sure why she presented to the ED and as such history obtained from ED physician's note.  It is noted that over the last several weeks patient has had progressive worsening shortness of breath with a sensation that she could not breathe.  Patient seen in the ED a month prior to admission with similar symptoms and diagnosed with anxiety.  It is noted that patient recently learned that she possibly had metastatic cancer and has been having worsening shortness of breath since then.  Patient noted to have complaints of coughing as well as diffuse chest pain/tightness.  Is noted per ED physician the patient did not have any anxiolytics at home.  Patient due to see her psychiatrist tomorrow.  ED physician patient noted to have some expiratory wheezing and increased work of breathing as well as worsening shortness of breath.  [facility] hospitalist were called to admit the patient for an acute COPD exacerbation.   Clinical Impression   Pt admitted with the above. Pt currently with functional limitations due to the deficits listed below (see OT Problem  List).  Pt will benefit from skilled OT to increase their safety and independence with ADL and functional mobility for ADL to facilitate discharge to venue listed below.      Follow Up Recommendations  Home health OT;SNF(depending on progress)    Equipment Recommendations  None recommended by OT    Recommendations for Other Services       Precautions / Restrictions Precautions Precautions: Fall      Mobility Bed Mobility         min A to initiate OOB           Transfers            did not perform              ADL either performed or assessed with clinical judgement   ADL Overall ADL's : Needs assistance/impaired Eating/Feeding: Set up;Sitting   Grooming: Set up;Sitting                                 General ADL Comments: attempted OOB but pt then declined     Vision Patient Visual Report: No change from baseline       Perception     Praxis      Pertinent Vitals/Pain Pain Assessment: 0-10 Pain Location: kidneys and stomach Pain Descriptors / Indicators: Sharp Pain Intervention(s): Limited activity within patient's tolerance;Monitored during session     Hand Dominance     Extremity/Trunk Assessment Upper Extremity Assessment Upper Extremity Assessment: Generalized weakness  Communication Communication Communication: No difficulties   Cognition Arousal/Alertness: Awake/alert Behavior During Therapy: Anxious Overall Cognitive Status: Within Functional Limits for tasks assessed                                     General Comments   anxiety limiting pt            Home Living Family/patient expects to be discharged to:: Private residence Living Arrangements: Spouse/significant other Available Help at Discharge: Family Type of Home: House Home Access: Stairs to enter [employer] of Steps:  a few Entrance Stairs-Rails: Right Home Layout: One level     Bathroom Shower/Tub:  Teacher, early years/pre: [place]: Environmental consultant - 2 wheels;Bedside commode;Walker - 4 wheels          Prior Functioning/Environment Level of Independence: Independent with assistive device(s)                 OT Problem List: Decreased strength;Decreased activity tolerance;Impaired balance (sitting and/or standing);Decreased safety awareness;Decreased knowledge of precautions;Decreased knowledge of use of DME or AE      OT Treatment/Interventions: Self-care/ADL training;Patient/family education;Therapeutic activities;DME and/or AE instruction;Energy conservation    OT Goals(Current goals can be found in the care plan section) Acute Rehab OT Goals Patient Stated Goal: not be anxious OT Goal Formulation: With patient Time For Goal Achievement: 10/31/17 Potential to Achieve Goals: Good  OT Frequency: Min 2X/week   Barriers to D/C:            Co-evaluation              AM-PAC PT "6 Clicks" Daily Activity     Outcome Measure Help from another person eating meals?: A Little Help from another person taking care of personal grooming?: A Little Help from another person toileting, which includes using toliet, bedpan, or urinal?: A Lot Help from another person bathing (including washing, rinsing, drying)?: A Little Help from another person to put on and taking off regular upper body clothing?: A Lot Help from another person to put on and taking off regular lower body clothing?: A Lot 6 Click Score: 15   End of Session Nurse Communication: Mobility status  Activity Tolerance: Patient limited by fatigue;Treatment limited secondary to agitation Patient left: in bed;with call bell/phone within reach  OT Visit Diagnosis: Unsteadiness on feet (R26.81);Other abnormalities of gait and mobility (R26.89);History of falling (Z91.81)                Time: 1420-1440 OT Time Calculation (min): 20 min Charges:  OT General Charges $OT Visit: 1 Visit OT  Evaluation $OT Eval Moderate Complexity: 1 Mod  Kari Baars, OT Acute Rehabilitation Services [phone] Office- [phone], Edwena Felty D 10/24/2017, 3:37 PM

## 2017-10-24 NOTE — Progress Notes (Signed)
Pt refused blood draw @2200  raised her voice at lab and told lab she would do it in the morning. Upon return for blood draw pt was yelling and arguing with lab again. When staff reminded her of previous conversation pt allowed to draw blood. Will continue to monitor.

## 2017-10-24 NOTE — Progress Notes (Signed)
Pt had some periods of Bigeminy on tele and was asymptomatic.  Writer notified provider. Pt is currently in NSR. Will continue to monitor.

## 2017-10-24 NOTE — Progress Notes (Addendum)
PROGRESS NOTE    [name]  XNA:[id number] DOB: [birth year] DOA: 10/23/2017 PCP: Azzie Glatter, FNP    Brief Narrative:  Patient 67 year old female history of COPD, anxiety, prior history of polysubstance abuse, prior history of alcohol abuse however patient denies any recent use, remote history of renal cell carcinoma status post radiofrequency ablation of left renal cell carcinoma January 2009 followed by cryoablation in May 2012 for local recurrence, recent history of left lower lobe mass being followed by oncology pathology consistent with metastatic adenocarcinoma presented to the ED with worsening shortness of breath wheezing with concerns for an acute COPD exacerbation and general anxiety disorder.  Patient admitted being treated for an acute COPD exacerbation as well as generalized anxiety disorder.   Assessment & Plan:   Principal Problem:   COPD with acute exacerbation ([facility]) Active Problems:   Anxiety state   GERD   GASTRIC ULCER, HX OF   Thrombocytopenia ([facility])   HTN (hypertension), malignant   Hypokalemia   Protein-calorie malnutrition, severe ([facility])   Tobacco abuse   Failure to thrive in adult   Lung mass  #1 acute COPD exacerbation Patient presented worsening shortness of breath, diffuse wheezing.  Patient with some clinical improvement.  Continue IV Solu-Medrol taper and transition to oral prednisone tomorrow.  Discontinue IV Rocephin and placed on oral doxycycline.  Continue scheduled neb treatments.  Continue Pulmicort.  Follow.  2.  Generalized anxiety disorder Patient noted to have a history of generalized anxiety disorder was on chronic anxiolytics as well as chronic opioid pain medication prior to sudden discontinuation after her doctor lost his license.  Patient recently diagnosed with a lung mass concerning for metastatic adenocarcinoma which is likely contributed to her worsening generalized anxiety disorder.  Patient on Xanax however states no  significant improvement with his symptoms.  Patient noted to previously be on Ativan.  Patient noted to have refused BuSpar this morning.  Discontinue Xanax and placed on Ativan 0.5 mg p.o. every 8 hours as needed.  Patient noted to have an appointment scheduled for November 03, 2017 with her psychiatrist who per her husband will continue to manage her generalized anxiety disorder.  Continue current dose of Ativan and will likely need a prescription for a few pills in order to tide patient over until her appointment with a psychiatrist.  Follow for now.  3.  Gastroesophageal reflux disease/history of gastric ulcer Continue Pepcid.  4.  Severe protein calorie malnutrition/failure to thrive Likely secondary to recently diagnosed lung mass/metastatic adenocarcinoma in the setting of COPD.  Continue Ensure.  5.  Lung mass Status post CT-guided biopsy 10/10/2017 consistent metastatic adenocarcinoma.  Outpatient follow-up with oncology.  6.  Hypertension Stable.  Continue Cozaar.  7 hypomagnesemia  Replete.   DVT prophylaxis: Lovenox Code Status: Full Family Communication: Updated patient and husband at bedside. Disposition Plan: Likely home when clinically improved, improvement with anxiety and on oral antibiotics hopefully in the next 24 hours.   Consultants:   None  Procedures:   CT angiogram chest 10/23/2017  Chest x-ray 10/23/2017  Antimicrobials:   IV Rocephin 10/23/2017>>>>> 10/24/2017  Doxycycline 10/24/2017   Subjective: Patient sitting up in bed with tremors.  Patient still complaining of shortness of breath.  Patient also complained of severe anxiety.  Patient states Xanax is not helping her anxiety and used to be on Ativan which worked better.  Per RN patient refused BuSpar this morning.  Objective: Vitals:   10/23/17 1928 10/23/17 2058 10/24/17 0106 10/24/17 5427  BP:  128/77  (!) 144/85  Pulse:  97  95  Resp:  20  18  Temp:  98.8 F (37.1 C)  98.6 F (37 C)    TempSrc:  Oral  Oral  SpO2: 100% 100% 100% 97%    Intake/Output Summary (Last 24 hours) at 10/24/2017 1000 Last data filed at 10/24/2017 0400 Gross per 24 hour  Intake 740 ml  Output -  Net 740 ml   There were no vitals filed for this visit.  Examination:  General exam: Appears calm and comfortable.  Cachectic.  Frail.  Tremors. Respiratory system: Decreased expiratory wheezing, no crackles, no rhonchi.  Upper airway noise.  Respiratory effort normal. Cardiovascular system: Tachycardia.  No JVD, no murmurs, no rubs, no gallops.  No lower extremity edema. Gastrointestinal system: Abdomen is nondistended, soft and nontender. No organomegaly or masses felt. Normal bowel sounds heard. Central nervous system: Alert and oriented. No focal neurological deficits. Extremities: Symmetric 5 x 5 power. Skin: No rashes, lesions or ulcers Psychiatry: Judgement and insight appear normal. Mood & affect appropriate.     Data Reviewed: I have personally reviewed following labs and imaging studies  CBC: Recent Labs  Lab 10/19/17 0748 10/23/17 1227 10/24/17 0459  WBC 10.6* 10.6* 5.2  NEUTROABS 8.0* 8.3* 4.5  HGB 9.8* 10.2* 9.0*  HCT 30.9* 32.4* 30.7*  MCV 88.0 89.0 92.7  PLT 247 310 295   Basic Metabolic Panel: Recent Labs  Lab 10/23/17 1227 10/24/17 0459  NA 137 137  K 4.2 4.5  CL 101 109  CO2 25 21*  GLUCOSE 103* 141*  BUN 9 14  CREATININE 0.70 0.70  CALCIUM 9.7 8.7*  MG  --  1.6*   GFR: Estimated Creatinine Clearance: 35.6 mL/min (by C-G formula based on SCr of 0.7 mg/dL). Liver Function Tests: Recent Labs  Lab 10/24/17 0459  AST 18  ALT 12  ALKPHOS 71  BILITOT 0.0*  PROT 6.4*  ALBUMIN 3.3*   Recent Labs  Lab 10/24/17 0459  LIPASE 22   No results for input(s): AMMONIA in the last 168 hours. Coagulation Profile: No results for input(s): INR, PROTIME in the last 168 hours. Cardiac Enzymes: No results for input(s): CKTOTAL, CKMB, CKMBINDEX, TROPONINI in the  last 168 hours. BNP (last 3 results) No results for input(s): PROBNP in the last 8760 hours. HbA1C: No results for input(s): HGBA1C in the last 72 hours. CBG: Recent Labs  Lab 10/24/17 0750  GLUCAP 134*   Lipid Profile: No results for input(s): CHOL, HDL, LDLCALC, TRIG, CHOLHDL, LDLDIRECT in the last 72 hours. Thyroid Function Tests: Recent Labs    10/24/17 0459  TSH 0.324*   Anemia Panel: No results for input(s): VITAMINB12, FOLATE, FERRITIN, TIBC, IRON, RETICCTPCT in the last 72 hours. Sepsis Labs: No results for input(s): PROCALCITON, LATICACIDVEN in the last 168 hours.  No results found for this or any previous visit (from the past 240 hour(s)).       Radiology Studies: Dg Chest 2 View  Result Date: 10/23/2017 CLINICAL DATA:  Anxiety.  Lung mass. EXAM: CHEST - 2 VIEW COMPARISON:  10/10/2017 FINDINGS: Cardiomediastinal silhouette is normal. Mediastinal contours appear intact. Calcific atherosclerotic disease of the aorta. There is no evidence of pneumothorax. Hyperinflation and upper lobe predominant emphysema. Known left lower lobe lung mass projects over the cardiac silhouette. Group pulmonary nodules in the right lower lobe also evident. Osseous structures are without acute abnormality. Soft tissues are grossly normal. IMPRESSION: Hyperinflation and upper lobe predominant emphysema. Known  left lower lobe lung mass and right lower lobe cluster of pulmonary nodules are again seen. No evidence of pneumothorax. Calcific atherosclerotic disease of the aorta. Electronically Signed   By: Fidela Salisbury M.D.   On: 10/23/2017 13:03   Ct Angio Chest Pe W And/or Wo Contrast  Result Date: 10/23/2017 CLINICAL DATA:  Chest pain, shortness of breath. EXAM: CT ANGIOGRAPHY CHEST WITH CONTRAST TECHNIQUE: Multidetector CT imaging of the chest was performed using the standard protocol during bolus administration of intravenous contrast. Multiplanar CT image reconstructions and MIPs were  obtained to evaluate the vascular anatomy. CONTRAST:  66mL ISOVUE-370 IOPAMIDOL (ISOVUE-370) INJECTION 76% COMPARISON:  CT scan of September 18, 2017. FINDINGS: Cardiovascular: Satisfactory opacification of the pulmonary arteries to the segmental level. No evidence of pulmonary embolism. Normal heart size. No pericardial effusion. Atherosclerosis of thoracic aorta is noted without aneurysm or dissection. Mediastinum/Nodes: No enlarged mediastinal, hilar, or axillary lymph nodes. Thyroid gland, trachea, and esophagus demonstrate no significant findings. Lungs/Pleura: No pneumothorax or pleural effusion is noted. 3.2 cm left lower lobe mass is noted medially which was recently biopsied. Irregular density is noted posteriorly in the right lower lobe which may represent postinfectious scarring. Upper Abdomen: No acute abnormality. Musculoskeletal: No chest wall abnormality. No acute or significant osseous findings. Review of the MIP images confirms the above findings. IMPRESSION: No definite evidence of pulmonary embolus. 3.2 cm left lower lobe mass is noted which was recently biopsied. Irregular density is noted posteriorly in the right lower lobe which may represent postinfectious scarring as pneumonia was noted here previously. Aortic Atherosclerosis (ICD10-I70.0). Electronically Signed   By: Marijo Conception, M.D.   On: 10/23/2017 17:19        Scheduled Meds: . budesonide (PULMICORT) nebulizer solution  0.5 mg Nebulization BID  . busPIRone  5 mg Oral BID  . enoxaparin (LOVENOX) injection  30 mg Subcutaneous QHS  . famotidine  20 mg Oral Daily  . feeding supplement (ENSURE ENLIVE)  237 mL Oral BID BM  . ipratropium  0.5 mg Nebulization TID  . levalbuterol  0.63 mg Nebulization TID  . losartan  50 mg Oral Daily  . methylPREDNISolone (SOLU-MEDROL) injection  60 mg Intravenous Q8H   Followed by  . [START ON 10/25/2017] predniSONE  40 mg Oral Q breakfast  . nicotine  14 mg Transdermal Daily  . sodium  chloride flush  3 mL Intravenous Q12H   Continuous Infusions: . sodium chloride 75 mL/hr at 10/23/17 2244  . cefTRIAXone (ROCEPHIN)  IV 1 g (10/23/17 2023)  . magnesium sulfate 1 - 4 g bolus IVPB 4 g (10/24/17 0831)     LOS: 1 day    Time spent: [place], MD [facility] Pager [phone]  If 7PM-7AM, please contact night-coverage [email or web address] Password [email or web address] 10/24/2017, 10:00 AM

## 2017-10-25 DIAGNOSIS — F411 Generalized anxiety disorder: Secondary | ICD-10-CM | POA: Diagnosis not present

## 2017-10-25 DIAGNOSIS — J441 Chronic obstructive pulmonary disease with (acute) exacerbation: Secondary | ICD-10-CM | POA: Diagnosis not present

## 2017-10-25 DIAGNOSIS — K219 Gastro-esophageal reflux disease without esophagitis: Secondary | ICD-10-CM | POA: Diagnosis not present

## 2017-10-25 DIAGNOSIS — R627 Adult failure to thrive: Secondary | ICD-10-CM | POA: Diagnosis not present

## 2017-10-25 DIAGNOSIS — E876 Hypokalemia: Secondary | ICD-10-CM

## 2017-10-25 LAB — CBC WITH DIFFERENTIAL/PLATELET
ABS IMMATURE GRANULOCYTES: 0.03 10*3/uL (ref 0.00–0.07)
BASOS ABS: 0 10*3/uL (ref 0.0–0.1)
Basophils Relative: 0 %
EOS PCT: 2 %
Eosinophils Absolute: 0.2 10*3/uL (ref 0.0–0.5)
HEMATOCRIT: 36 % (ref 36.0–46.0)
HEMOGLOBIN: 10.5 g/dL — AB (ref 12.0–15.0)
Immature Granulocytes: 0 %
LYMPHS ABS: 1.1 10*3/uL (ref 0.7–4.0)
LYMPHS PCT: 13 %
MCH: 27.4 pg (ref 26.0–34.0)
MCHC: 29.2 g/dL — AB (ref 30.0–36.0)
MCV: 94 fL (ref 80.0–100.0)
Monocytes Absolute: 0.8 10*3/uL (ref 0.1–1.0)
Monocytes Relative: 10 %
NRBC: 0 % (ref 0.0–0.2)
Neutro Abs: 6.1 10*3/uL (ref 1.7–7.7)
Neutrophils Relative %: 75 %
Platelets: 230 10*3/uL (ref 150–400)
RBC: 3.83 MIL/uL — ABNORMAL LOW (ref 3.87–5.11)
RDW: 15.7 % — ABNORMAL HIGH (ref 11.5–15.5)
WBC: 8.2 10*3/uL (ref 4.0–10.5)

## 2017-10-25 LAB — BASIC METABOLIC PANEL
ANION GAP: 4 — AB (ref 5–15)
BUN: 19 mg/dL (ref 8–23)
CO2: 21 mmol/L — AB (ref 22–32)
CREATININE: 0.77 mg/dL (ref 0.44–1.00)
Calcium: 9 mg/dL (ref 8.9–10.3)
Chloride: 113 mmol/L — ABNORMAL HIGH (ref 98–111)
GFR calc non Af Amer: >60 mL/min (ref >60)
Glucose, Bld: 107 mg/dL — ABNORMAL HIGH (ref 70–99)
Potassium: 4.5 mmol/L (ref 3.5–5.1)
Sodium: 138 mmol/L (ref 135–145)

## 2017-10-25 LAB — URINE CULTURE

## 2017-10-25 LAB — MAGNESIUM: Magnesium: 2.4 mg/dL (ref 1.7–2.4)

## 2017-10-25 MED ORDER — TIOTROPIUM BROMIDE MONOHYDRATE 18 MCG IN CAPS: ORAL_CAPSULE | RESPIRATORY_TRACT | 0 refills | Status: DC

## 2017-10-25 MED ORDER — PREDNISONE 20 MG PO TABS: 40.0000 mg | ORAL_TABLET | Freq: Every day | ORAL | 0 refills | Status: AC

## 2017-10-25 MED ORDER — NICOTINE 14 MG/24HR TD PT24: 14.0000 mg | MEDICATED_PATCH | Freq: Every day | TRANSDERMAL | 0 refills | Status: DC

## 2017-10-25 MED ORDER — PNEUMOCOCCAL VAC POLYVALENT 25 MCG/0.5ML IJ INJ: 0.5000 mL | INJECTION | INTRAMUSCULAR | Status: DC

## 2017-10-25 MED ORDER — ENSURE ENLIVE PO LIQD: 237.0000 mL | Freq: Three times a day (TID) | ORAL | 0 refills | Status: AC

## 2017-10-25 MED ORDER — LORAZEPAM 1 MG PO TABS: 1.0000 mg | ORAL_TABLET | Freq: Three times a day (TID) | ORAL | 0 refills | Status: DC | PRN

## 2017-10-25 MED ORDER — ADULT MULTIVITAMIN W/MINERALS CH: 1.0000 | ORAL_TABLET | Freq: Every day | ORAL | Status: DC

## 2017-10-25 MED ORDER — PREDNISONE 20 MG PO TABS: 40.0000 mg | ORAL_TABLET | Freq: Every day | ORAL | 0 refills | Status: DC

## 2017-10-25 MED ORDER — DOXYCYCLINE HYCLATE 100 MG PO TABS: 100.0000 mg | ORAL_TABLET | Freq: Two times a day (BID) | ORAL | 0 refills | Status: AC

## 2017-10-25 MED ORDER — LORAZEPAM 1 MG PO TABS
1.0000 mg | ORAL_TABLET | Freq: Three times a day (TID) | ORAL | Status: DC | PRN
  Administered 2017-10-25: 1 mg via ORAL
  Filled 2017-10-25: qty 1

## 2017-10-25 MED ORDER — DOXYCYCLINE HYCLATE 100 MG PO TABS: 100.0000 mg | ORAL_TABLET | Freq: Two times a day (BID) | ORAL | 0 refills | Status: DC

## 2017-10-25 MED ORDER — TIOTROPIUM BROMIDE MONOHYDRATE 18 MCG IN CAPS: ORAL_CAPSULE | RESPIRATORY_TRACT | 0 refills | Status: AC

## 2017-10-25 MED ORDER — IPRATROPIUM-ALBUTEROL 0.5-2.5 (3) MG/3ML IN SOLN: 3.0000 mL | Freq: Four times a day (QID) | RESPIRATORY_TRACT | 0 refills | Status: AC | PRN

## 2017-10-25 MED ORDER — IPRATROPIUM-ALBUTEROL 0.5-2.5 (3) MG/3ML IN SOLN: 3.0000 mL | Freq: Four times a day (QID) | RESPIRATORY_TRACT | 0 refills | Status: DC

## 2017-10-25 NOTE — Discharge Summary (Signed)
Physician Discharge Summary  [name] NAT:[id number] DOB: [birth year] DOA: 10/23/2017  PCP: Azzie Glatter, FNP  Admit date: 10/23/2017 Discharge date: 10/25/2017  Time spent: 55 minutes  Recommendations for Outpatient Follow-up:  1. Follow-up with Azzie Glatter, FNP in 2 weeks.  On follow-up patient's COPD will need to be reassessed.  Patient will also need a basic metabolic profile as well as a magnesium level checked to follow-up on electrolytes and renal function. 2. Follow-up with Dr. Benay Spice, oncology as scheduled. 3. Follow-up with psychiatrist as scheduled for management of patient's generalized anxiety disorder.  Patient was given a prescription of Ativan 1 mg every 8 hours as needed # 15 tablets until she is able to follow-up with psychiatrist.   Discharge Diagnoses:  Principal Problem:   COPD with acute exacerbation ([place]) Active Problems:   Anxiety state   GERD   GASTRIC ULCER, HX OF   Thrombocytopenia ([facility])   HTN (hypertension), malignant   Hypokalemia   Protein-calorie malnutrition, severe ([facility])   Tobacco abuse   Failure to thrive in adult   Lung mass   Discharge Condition: Stable and improved  Diet recommendation: Regular  There were no vitals filed for this visit.  History of present illness:  [name] is a 67 y.o. female with medical history significant of COPD,  Anxiety, hx of polysubstance, prior hx of alcohol abuse however patient denied any recent use, remote history of renal cell carcinoma status post radiofrequency ablation of left renal cell carcinoma January 2009 followed by cryoablation in May 2012 for local recurrence, recent history of left lower lobe mass per chest x-ray of 09/15/2017 being followed by oncology with pathology done over CT guided biopsy 10/10/2017 consistent with metastatic adenocarcinoma who presented to the ED with complaints of shortness of breath.  Patient also with multiple other complaints.  At the time of  interview patient stated that she was confused and not sure why she presented to the ED and as such history obtained from ED physician's note.  It is noted that over the last several weeks patient has had progressive worsening shortness of breath with a sensation that she could not breathe.  Patient seen in the ED a month prior to admission with similar symptoms and diagnosed with anxiety.  It was noted that patient recently learned that she possibly had metastatic cancer and has been having worsening shortness of breath since then.  Patient noted to have complaints of coughing as well as diffuse chest pain/tightness.  Is noted per ED physician the patient did not have any anxiolytics at home.  Patient due to see her psychiatrist tomorrow.  ED physician patient noted to have some expiratory wheezing and increased work of breathing as well as worsening shortness of breath.  [facility] hospitalist were called to admit the patient for an acute COPD exacerbation.  ED Course: She is seen in the ED, VBG pH of 7.389, PCO2 of 42, PO2 of 31, bicarb of 25, O2 sats of 57.  Basic metabolic profile obtained unremarkable.  CBC obtained had a white count of 10.6, hemoglobin of 10.2 otherwise was unremarkable.  Chest x-ray obtained showed hyperinflation and upper lobe predominant emphysema.  Known left lower lobe lung mass and right lower lobe cluster of pulmonary nodules again seen.  No evidence of pneumothorax.  Calcified atherosclerotic disease of the aorta.  CT angiogram chest which was done showed no definite evidence of pulmonary embolus.  3.2 cm left lower lobe mass noted which was recently  biopsied.  Irregular density noted posteriorly in the right lower lobe which may represent postinfectious scarring as pneumonia was noted here previously.  Hospital Course:  1 acute COPD exacerbation Patient presented with worsening shortness of breath, diffuse wheezing.  Patient admitted for with an acute COPD exacerbation.  Patient  initially placed on IV Solu-Medrol, IV Rocephin, scheduled neb treatments, Pulmicort.  Patient also noted to have some upper airway noise.  Patient improved clinically during the hospitalization was transitioned from IV Solu-Medrol to oral prednisone taper.  IV Rocephin was subsequently transitioned to oral doxycycline.  Patient be discharged home on 2 more days of oral doxycycline as well as a steroid taper and nebulizer treatments.  Patient was discharged home in stable and improved condition and is to follow-up with PCP in the outpatient setting.  2.  Generalized anxiety disorder Patient noted to have a history of generalized anxiety disorder was on chronic anxiolytics as well as chronic opioid pain medication prior to sudden discontinuation after her doctor lost his license.  Patient recently diagnosed with a lung mass concerning for metastatic adenocarcinoma which is likely contributed to her worsening generalized anxiety disorder.  Patient on Xanax however stated no significant improvement with his symptoms.  Patient noted to previously be on Ativan 2 mg 3 times daily as needed.  Patient noted to have refused BuSpar during the hospitalization which was subsequently discontinued.  Xanax was discontinued and patient started on Ativan 0.5 mg every 8 hours as needed and dose uptitrated to 1 mg every 8 hours as needed by day of discharge. Patient noted to have an appointment scheduled for November 03, 2017 with her psychiatrist who per her husband will continue to manage her generalized anxiety disorder.  Patient was given a prescription for Ativan 1 mg 3 times daily as needed tablet number 15 tablets until patient is able to follow-up with her psychiatrist.  Patient be discharged in stable and improved condition.   3.  Gastroesophageal reflux disease/history of gastric ulcer Patient maintained on home regimen of Pepcid.  Outpatient follow-up.   4.  Severe protein calorie malnutrition/failure to  thrive Likely secondary to recently diagnosed lung mass/metastatic adenocarcinoma in the setting of COPD.    Patient placed on nutritional supplementation.   5.  Lung mass Status post CT-guided biopsy 10/10/2017 consistent metastatic adenocarcinoma.  Outpatient follow-up with oncology.  6.  Hypertension Stable.    Maintained on home regimen of Cozaar.    7 hypomagnesemia  Repleted.  Procedures:  CT angiogram chest 10/23/2017  Chest x-ray 10/23/2017  Consultations:  None  Discharge Exam: Vitals:   10/25/17 1401 10/25/17 1447  BP:  (!) 107/57  Pulse:  84  Resp:  20  Temp:  98.7 F (37.1 C)  SpO2: 100% 100%    General: NAD Cardiovascular: RRR Respiratory: CTAB  Discharge Instructions   Discharge Instructions    Diet general   Complete by:  As directed    Increase activity slowly   Complete by:  As directed      Allergies as of 10/25/2017      Reactions   Aspirin Other (See Comments)   Break out in welts   Chlorpromazine Hcl Other (See Comments)   Muscle spasms - "really bad"      Medication List    STOP taking these medications   busPIRone 10 MG tablet Commonly known as:  BUSPAR     TAKE these medications   doxycycline 100 MG tablet Commonly known as:  VIBRA-TABS Take 1  tablet (100 mg total) by mouth every 12 (twelve) hours for 2 days.   famotidine 20 MG tablet Commonly known as:  PEPCID Take 1 tablet (20 mg total) by mouth daily. What changed:    when to take this  reasons to take this   feeding supplement (ENSURE ENLIVE) Liqd Take 237 mLs by mouth 3 (three) times daily between meals.   ipratropium-albuterol 0.5-2.5 (3) MG/3ML Soln Commonly known as:  DUONEB Take 3 mLs by nebulization every 6 (six) hours as needed. Use 3 times daily x 4 days, then every 6 hours as needed. What changed:    when to take this  reasons to take this  additional instructions   LORazepam 1 MG tablet Commonly known as:  ATIVAN Take 1 tablet (1 mg  total) by mouth every 8 (eight) hours as needed for anxiety.   losartan 50 MG tablet Commonly known as:  COZAAR Take 50 mg by mouth daily.   multivitamin with minerals Tabs tablet Take 1 tablet by mouth daily. Start taking on:  10/26/2017   nicotine 14 mg/24hr patch Commonly known as:  NICODERM CQ - dosed in mg/24 hours Place 1 patch (14 mg total) onto the skin daily. Start taking on:  10/26/2017   ondansetron 4 MG tablet Commonly known as:  ZOFRAN Take 4 mg by mouth daily as needed for nausea/vomiting.   predniSONE 20 MG tablet Commonly known as:  DELTASONE Take 2 tablets (40 mg total) by mouth daily with breakfast for 4 days. Start taking on:  10/26/2017   PROAIR HFA 108 (90 Base) MCG/ACT inhaler Generic drug:  albuterol Inhale 1 puff into the lungs every 4 (four) hours as needed for wheezing or shortness of breath.   tiotropium 18 MCG inhalation capsule Commonly known as:  SPIRIVA PLACE 1 CAPSULE INTO INHALER AND INHALE DAILY Start taking on:  10/29/2017 What changed:  These instructions start on 10/29/2017. If you are unsure what to do until then, ask your doctor or other care provider.      Allergies  Allergen Reactions  . Aspirin Other (See Comments)    Break out in welts  . Chlorpromazine Hcl Other (See Comments)    Muscle spasms - "really bad"   Follow-up Information    Azzie Glatter, FNP. Schedule an appointment as soon as possible for a visit in 2 week(s).   Specialty:  Family Medicine Contact information: [address] [place] [address] [phone]        psychiatry Follow up.   Why:  f/u as scheduled.       Ladell Pier, MD Follow up.   Specialty:  Oncology Why:  f/u as scheduled Contact information: [place] [address] [phone]        [place], [place] Follow up.   Specialty:  Home Health Services Why:  Nanine Means will call you 24 to 48 hours after discharge for follow up.   Contact information: [place] [address] [phone]            The results of significant diagnostics from this hospitalization (including imaging, microbiology, ancillary and laboratory) are listed below for reference.    Significant Diagnostic Studies: Dg Chest 2 View  Result Date: 10/23/2017 CLINICAL DATA:  Anxiety.  Lung mass. EXAM: CHEST - 2 VIEW COMPARISON:  10/10/2017 FINDINGS: Cardiomediastinal silhouette is normal. Mediastinal contours appear intact. Calcific atherosclerotic disease of the aorta. There is no evidence of pneumothorax. Hyperinflation  and upper lobe predominant emphysema. Known left lower lobe lung mass projects over the cardiac silhouette. Group pulmonary nodules in the right lower lobe also evident. Osseous structures are without acute abnormality. Soft tissues are grossly normal. IMPRESSION: Hyperinflation and upper lobe predominant emphysema. Known left lower lobe lung mass and right lower lobe cluster of pulmonary nodules are again seen. No evidence of pneumothorax. Calcific atherosclerotic disease of the aorta. Electronically Signed   By: Fidela Salisbury M.D.   On: 10/23/2017 13:03   Ct Angio Chest Pe W And/or Wo Contrast  Result Date: 10/23/2017 CLINICAL DATA:  Chest pain, shortness of breath. EXAM: CT ANGIOGRAPHY CHEST WITH CONTRAST TECHNIQUE: Multidetector CT imaging of the chest was performed using the standard protocol during bolus administration of intravenous contrast. Multiplanar CT image reconstructions and MIPs were obtained to evaluate the vascular anatomy. CONTRAST:  16mL ISOVUE-370 IOPAMIDOL (ISOVUE-370) INJECTION 76% COMPARISON:  CT scan of September 18, 2017. FINDINGS: Cardiovascular: Satisfactory opacification of the pulmonary arteries to the segmental level. No evidence of pulmonary embolism. Normal heart size. No pericardial effusion. Atherosclerosis of thoracic aorta is noted without aneurysm or dissection.  Mediastinum/Nodes: No enlarged mediastinal, hilar, or axillary lymph nodes. Thyroid gland, trachea, and esophagus demonstrate no significant findings. Lungs/Pleura: No pneumothorax or pleural effusion is noted. 3.2 cm left lower lobe mass is noted medially which was recently biopsied. Irregular density is noted posteriorly in the right lower lobe which may represent postinfectious scarring. Upper Abdomen: No acute abnormality. Musculoskeletal: No chest wall abnormality. No acute or significant osseous findings. Review of the MIP images confirms the above findings. IMPRESSION: No definite evidence of pulmonary embolus. 3.2 cm left lower lobe mass is noted which was recently biopsied. Irregular density is noted posteriorly in the right lower lobe which may represent postinfectious scarring as pneumonia was noted here previously. Aortic Atherosclerosis (ICD10-I70.0). Electronically Signed   By: Marijo Conception, M.D.   On: 10/23/2017 17:19   Nm Pet Image Initial (pi) Skull Base To Thigh  Result Date: 10/03/2017 CLINICAL DATA:  Initial treatment strategy for right lung mass. EXAM: NUCLEAR MEDICINE PET SKULL BASE TO THIGH TECHNIQUE: 4.4 mCi F-18 FDG was injected intravenously. Full-ring PET imaging was performed from the skull base to thigh after the radiotracer. CT data was obtained and used for attenuation correction and anatomic localization. Fasting blood glucose: 73 mg/dl COMPARISON:  Multiple exams, including 09/18/2017 FINDINGS: Mediastinal blood pool activity: SUV max 0.5 NECK: High muscular activity in the pterygoid muscles and diffusely throughout the tongue without CT correlate, likely physiologic. Potential symmetric accentuated activity in the tonsils. There is misregistration between the CT and PET data in the head/neck region. Incidental CT findings: Bilateral carotid atherosclerotic calcification. Severe paucity of adipose tissue. CHEST: 3.0 by 2.8 cm left lower lobe pleural-based mass posteromedially  has maximum SUV of 8.0 and central low activity compatible with central necrosis. There is additional faint nodularity in both lower lobes with low-grade associated metabolic activity. For example, a 0.9 by 0.4 cm nodule in the right lower lobe on image 37/8 has a maximum SUV of 0.8 and a subpleural band of airspace opacity in the right lower lobe measuring 2.3 by 1.2 cm has a maximum SUV of 1.4. There is additional scattered indistinctly marginated lower lobe nodularity with small irregular nodules of varying size favoring the lower lobes. There is also some atelectasis or bandlike density below the level of the left lower lobe mass. Mild biapical pleuroparenchymal scarring. Incidental CT findings: Lower lobe consolidation is  generally improved from 09/18/2017, and aside from the left lower lobe mass, much of the nodularity and remaining opacity is probably inflammatory based on morphology and appearance. Coronary, aortic arch, and branch vessel atherosclerotic vascular disease. Emphysema noted. ABDOMEN/PELVIS: Left kidney lower pole ablation defect with peripheral rim calcification but no current hypermetabolic activity. Incidental CT findings: Aortoiliac atherosclerotic vascular disease. SKELETON: No focal hypermetabolic activity to suggest skeletal metastasis. Incidental CT findings: Bilateral hip screws. IMPRESSION: 1. 3.0 cm left lower lobe mass is hypermetabolic with maximum SUV of 8.0, compatible with malignancy. This mass has central necrosis. 2. Improved by lateral airspace opacities with bibasilar nodularity and bandlike densities. These have relatively low-level metabolic activity, maximum SUV up to 1.4, and are most likely inflammatory given the improving response., thought to be physiologic. High activity in the tongue and some neck musculature 3. Other imaging findings of potential clinical significance: Aortic Atherosclerosis (ICD10-I70.0) and Emphysema (ICD10-J43.9). Coronary atherosclerosis. Stable  appearance of left kidney lower pole ablation defect with peripheral rim calcification but no current hypermetabolic activity. Electronically Signed   By: Van Clines M.D.   On: 10/03/2017 13:15   Ir US Guide Vasc Access Right  Result Date: 10/10/2017 INDICATION: Poor IV access EXAM: VENA PUNCTURE BY MD MEDICATIONS: None ANESTHESIA/SEDATION: None FLUOROSCOPY TIME:  None COMPLICATIONS: None immediate. PROCEDURE: Informed written consent was obtained from the patient after a thorough discussion of the procedural risks, benefits and alternatives. All questions were addressed. Maximal Sterile Barrier Technique was utilized including caps, mask, sterile gowns, sterile gloves, sterile drape, hand hygiene and skin antiseptic. A timeout was performed prior to the initiation of the procedure. The right arm was prepped and draped in a sterile fashion. 1% lidocaine was utilized for local anesthesia. Under sonographic guidance, a micropuncture needle was inserted into the right basilic vein and removed over a 018 wire. The basilic vein was noted to be patent by sonography and documentation by sonography was performed. A 5 French transitional dilator was advanced over the wire. It was flushed and secured in place. IMPRESSION: Successful vena puncture by MD. Electronically Signed   By: Marybelle Killings M.D.   On: 10/10/2017 14:31   Ir US Guide Vasc Access Right  Result Date: 10/05/2017 CLINICAL DATA:  Difficult peripheral IV access. Lung nodule. Needs access for PET-CT.  EXAM: IR RADIOLOGY PERIPHERAL GUIDED IV START  ANESTHESIA/SEDATION: None  MEDICATIONS: Lidocaine 1% subcutaneous  PROCEDURE: The procedure, risks (including but not limited to bleeding, infection, organ damage ), benefits, and alternatives were explained to the patient. Questions regarding the procedure were encouraged and answered. The patient understands and consents to the procedure.  Patency of the right brachial vein confirmed by ultrasound  and documented. Overlying skin prepped with chlorhexidine, draped in usual sterile fashion. Maximal barrier sterile technique was utilized including caps, mask, sterile gowns, sterile gloves, sterile drape, hand hygiene and skin antiseptic. Under real-time ultrasound guidance, the vein was accessed with a 21-gauge micropuncture needle, exchanged over a 018 guidewire for a 3 French vascular dilator. Dilator flushed and aspirated easily. Dilator was capped, flushed, and secured.  COMPLICATIONS: None immediate  FINDINGS: Patent right brachial vein.  Peripheral IV placed.  IMPRESSION: 1. Technically successful right brachial vein peripheral IV placement under ultrasound. Okay for routine use.   Electronically Signed   By: Lucrezia Europe M.D.   On: 10/03/2017 09:13  CLINICAL DATA:  Difficult peripheral IV access. Lung nodule. Needs access for PET-CT.  EXAM: IR RADIOLOGY PERIPHERAL GUIDED IV START  ANESTHESIA/SEDATION: None  MEDICATIONS: Lidocaine 1% subcutaneous  PROCEDURE: The procedure, risks (including but not limited to bleeding, infection, organ damage ), benefits, and alternatives were explained to the patient. Questions regarding the procedure were encouraged and answered. The patient understands and consents to the procedure.  Patency of the right brachial vein confirmed by ultrasound and documented. Overlying skin prepped with chlorhexidine, draped in usual sterile fashion. Maximal barrier sterile technique was utilized including caps, mask, sterile gowns, sterile gloves, sterile drape, hand hygiene and skin antiseptic. Under real-time ultrasound guidance, the vein was accessed with a 21-gauge micropuncture needle, exchanged over a 018 guidewire for a 3 French vascular dilator. Dilator flushed and aspirated easily. Dilator was capped, flushed, and secured.  COMPLICATIONS: None immediate  FINDINGS: Patent right brachial vein.  Peripheral IV placed.  IMPRESSION: 1. Technically successful right brachial  vein peripheral IV placement under ultrasound. Okay for routine use.   Electronically Signed   By: Lucrezia Europe M.D.   On: 10/03/2017 09:13  Ct Biopsy  Result Date: 10/10/2017 INDICATION: Left lower lobe lung mass EXAM: CT BIOPSY MEDICATIONS: None. ANESTHESIA/SEDATION: Fentanyl 100 mcg IV; Versed 2 mg IV Moderate Sedation Time:  12 minutes The patient was continuously monitored during the procedure by the interventional radiology nurse under my direct supervision. FLUOROSCOPY TIME:  Fluoroscopy Time:  minutes  seconds ( mGy). COMPLICATIONS: None immediate. PROCEDURE: Informed written consent was obtained from the patient after a thorough discussion of the procedural risks, benefits and alternatives. All questions were addressed. Maximal Sterile Barrier Technique was utilized including caps, mask, sterile gowns, sterile gloves, sterile drape, hand hygiene and skin antiseptic. A timeout was performed prior to the initiation of the procedure. Under CT guidance, a(n) 17 gauge guide needle was advanced into the left lower lobe lung mass. Subsequently 3 18 gauge core biopsies were obtained. The guide needle was removed. Post biopsy images demonstrate no hemorrhage or pneumothorax. Patient tolerated the procedure well without complication. Vital sign monitoring by nursing staff during the procedure will continue as patient is in the special procedures unit for post procedure observation. FINDINGS: The images document guide needle placement within the left lower lobe lung mass. Post biopsy images demonstrate no pneumothorax. A tiny amount of gas in the inter costal space is noted. IMPRESSION: Successful CT-guided left lower lobe lung mass core biopsy. Electronically Signed   By: Marybelle Killings M.D.   On: 10/10/2017 15:59   Dg Chest Port 1 View  Result Date: 10/10/2017 CLINICAL DATA:  Post biopsy EXAM: PORTABLE CHEST 1 VIEW COMPARISON:  10/06/2017 FINDINGS: There is no pneumothorax status post left lung biopsy. Lungs  remain hyperaerated. Left lower lobe mass is noted. Heart is normal in size. IMPRESSION: No pneumothorax post left lung biopsy. Electronically Signed   By: Marybelle Killings M.D.   On: 10/10/2017 14:18   Dg Chest Portable 1 View  Result Date: 10/06/2017 CLINICAL DATA:  Shortness of breath EXAM: PORTABLE CHEST 1 VIEW COMPARISON:  10/03/2017 FINDINGS: Cardiac shadow is within normal limits. Aortic calcifications are noted. The lungs are hyperinflated consistent with COPD. Patient's known left lower lobe mass lesion is again noted adjacent to the spine in a retrocardiac position. The known nodular changes in the bases are less well appreciated than on the prior exam. The band like density in the right base is again noted and stable. No bony abnormality is seen. IMPRESSION: Stable left lower lobe mass lesion and right basilar scarring. COPD. Electronically Signed   By: Inez Catalina M.D.   On:  10/06/2017 11:39   Dg Chest Port 1 View  Result Date: 10/01/2017 CLINICAL DATA:  Shortness of breath EXAM: PORTABLE CHEST 1 VIEW COMPARISON:  CT 09/18/2017, radiograph 09/17/2017 FINDINGS: Patchy right lower lobe infiltrate. Partially obscured medial left lung base mass. Hyperinflation with emphysematous disease. Stable cardiomediastinal silhouette with aortic atherosclerosis. No pneumothorax. IMPRESSION: 1. No significant interval change in patchy right lung base infiltrate. 2. Stable medial left lung base pulmonary mass. Electronically Signed   By: Donavan Foil M.D.   On: 10/01/2017 15:58   Ir Radiology Peripheral Guided Iv Start  Result Date: 10/10/2017 INDICATION: Poor IV access EXAM: VENA PUNCTURE BY MD MEDICATIONS: None ANESTHESIA/SEDATION: None FLUOROSCOPY TIME:  None COMPLICATIONS: None immediate. PROCEDURE: Informed written consent was obtained from the patient after a thorough discussion of the procedural risks, benefits and alternatives. All questions were addressed. Maximal Sterile Barrier Technique was utilized  including caps, mask, sterile gowns, sterile gloves, sterile drape, hand hygiene and skin antiseptic. A timeout was performed prior to the initiation of the procedure. The right arm was prepped and draped in a sterile fashion. 1% lidocaine was utilized for local anesthesia. Under sonographic guidance, a micropuncture needle was inserted into the right basilic vein and removed over a 018 wire. The basilic vein was noted to be patent by sonography and documentation by sonography was performed. A 5 French transitional dilator was advanced over the wire. It was flushed and secured in place. IMPRESSION: Successful vena puncture by MD. Electronically Signed   By: Marybelle Killings M.D.   On: 10/10/2017 14:31   Ir Radiology Peripheral Guided Iv Start  Result Date: 10/03/2017 CLINICAL DATA:  Difficult peripheral IV access. Lung nodule. Needs access for PET-CT. EXAM: IR RADIOLOGY PERIPHERAL GUIDED IV START ANESTHESIA/SEDATION: None MEDICATIONS: Lidocaine 1% subcutaneous PROCEDURE: The procedure, risks (including but not limited to bleeding, infection, organ damage ), benefits, and alternatives were explained to the patient. Questions regarding the procedure were encouraged and answered. The patient understands and consents to the procedure. Patency of the right brachial vein confirmed by ultrasound and documented. Overlying skin prepped with chlorhexidine, draped in usual sterile fashion. Maximal barrier sterile technique was utilized including caps, mask, sterile gowns, sterile gloves, sterile drape, hand hygiene and skin antiseptic. Under real-time ultrasound guidance, the vein was accessed with a 21-gauge micropuncture needle, exchanged over a 018 guidewire for a 3 French vascular dilator. Dilator flushed and aspirated easily. Dilator was capped, flushed, and secured. COMPLICATIONS: None immediate FINDINGS: Patent right brachial vein.  Peripheral IV placed. IMPRESSION: 1. Technically successful right brachial vein  peripheral IV placement under ultrasound. Okay for routine use. Electronically Signed   By: Lucrezia Europe M.D.   On: 10/03/2017 09:13    Microbiology: Recent Results (from the past 240 hour(s))  Culture, Urine     Status: Abnormal   Collection Time: 10/23/17  6:15 PM  Result Value Ref Range Status   Specimen Description   Final    URINE, RANDOM Performed at [facility] [address], [place], [place] [address]    Special Requests   Final    NONE Performed at [facility], [place] [address], [place], [place] [address]    Culture MULTIPLE SPECIES PRESENT, SUGGEST RECOLLECTION (A)  Final   Report Status 10/25/2017 FINAL  Final     Labs: Basic Metabolic Panel: Recent Labs  Lab 10/23/17 1227 10/24/17 0459 10/25/17 0507  NA 137 137 138  K 4.2 4.5 4.5  CL 101 109 113*  CO2 25 21* 21*  GLUCOSE 103* 141* 107*  BUN 9 14 19   CREATININE 0.70 0.70 0.77  CALCIUM 9.7 8.7* 9.0  MG  --  1.6* 2.4   Liver Function Tests: Recent Labs  Lab 10/24/17 0459  AST 18  ALT 12  ALKPHOS 71  BILITOT 0.0*  PROT 6.4*  ALBUMIN 3.3*   Recent Labs  Lab 10/24/17 0459  LIPASE 22   No results for input(s): AMMONIA in the last 168 hours. CBC: Recent Labs  Lab 10/19/17 0748 10/23/17 1227 10/24/17 0459 10/25/17 0507  WBC 10.6* 10.6* 5.2 8.2  NEUTROABS 8.0* 8.3* 4.5 6.1  HGB 9.8* 10.2* 9.0* 10.5*  HCT 30.9* 32.4* 30.7* 36.0  MCV 88.0 89.0 92.7 94.0  PLT 247 310 175 230   Cardiac Enzymes: No results for input(s): CKTOTAL, CKMB, CKMBINDEX, TROPONINI in the last 168 hours. BNP: BNP (last 3 results) No results for input(s): BNP in the last 8760 hours.  ProBNP (last 3 results) No results for input(s): PROBNP in the last 8760 hours.  CBG: Recent Labs  Lab 10/24/17 0750 10/24/17 1209  GLUCAP 134* 171*       Signed:  Irine Seal MD.  [facility] 10/25/2017, 3:07 PM

## 2017-10-25 NOTE — Progress Notes (Signed)
Occupational Therapy Treatment Patient Details Name: [name] MRN: [id number] DOB: 25-Nov-1950 Today's Date: 10/25/2017    History of present illness [name] is a 67 y.o. female with medical history significant of COPD,  Anxiety, hx of polysubstance, prior hx of alcohol abuse however patient denies any recent use, remote history of renal cell carcinoma status post radiofrequency ablation of left renal cell carcinoma January 2009 followed by cryoablation in May 2012 for local recurrence, recent history of left lower lobe mass per chest x-ray of 09/15/2017 being followed by oncology with pathology done over CT guided biopsy 10/10/2017 consistent with metastatic adenocarcinoma who presents to the ED with complaints of shortness of breath.  Patient also with multiple other complaints.  At the time of my interview patient stated that she was confused and not sure why she presented to the ED and as such history obtained from ED physician's note.  It is noted that over the last several weeks patient has had progressive worsening shortness of breath with a sensation that she could not breathe.  Patient seen in the ED a month prior to admission with similar symptoms and diagnosed with anxiety.  It is noted that patient recently learned that she possibly had metastatic cancer and has been having worsening shortness of breath since then.  Patient noted to have complaints of coughing as well as diffuse chest pain/tightness.  Is noted per ED physician the patient did not have any anxiolytics at home.  Patient due to see her psychiatrist tomorrow.  ED physician patient noted to have some expiratory wheezing and increased work of breathing as well as worsening shortness of breath.  [facility] were called to admit the patient for an acute COPD exacerbation.   OT comments  Pt with increased participation this day  Follow Up Recommendations  Home health OT    Equipment Recommendations  None  recommended by OT    Recommendations for Other Services      Precautions / Restrictions Precautions Precautions: Fall       Mobility Bed Mobility Overal bed mobility: Modified Independent                Transfers Overall transfer level: Needs assistance   Transfers: Sit to/from Stand;Stand Pivot Transfers Sit to Stand: Supervision Stand pivot transfers: Supervision                ADL either performed or assessed with clinical judgement   ADL Overall ADL's : Needs assistance/impaired Eating/Feeding: Set up;Sitting   Grooming: Set up;Standing                   Toilet Transfer: Supervision/safety;BSC;Cueing for sequencing             General ADL Comments: energy conservation handout provided and gone over in detail. Pt did not have glasses so OT went over handut with pt. Pt verbalized understanding     Vision Patient Visual Report: No change from baseline     Perception     Praxis      Cognition Arousal/Alertness: Awake/alert Behavior During Therapy: Anxious Overall Cognitive Status: Within Functional Limits for tasks assessed                                                General Comments      Pertinent Vitals/ Pain  Pain Assessment: No/denies pain     Prior Functioning/Environment              Frequency  Min 2X/week        Progress Toward Goals  OT Goals(current goals can now be found in the care plan section)  Progress towards OT goals: Progressing toward goals     Plan Discharge plan remains appropriate    Co-evaluation                 AM-PAC PT "6 Clicks" Daily Activity     Outcome Measure   Help from another person eating meals?: A Little Help from another person taking care of personal grooming?: A Little Help from another person toileting, which includes using toliet, bedpan, or urinal?: A Little Help from another person bathing (including washing, rinsing, drying)?: A  Little Help from another person to put on and taking off regular upper body clothing?: A Little Help from another person to put on and taking off regular lower body clothing?: A Little 6 Click Score: 18    End of Session    OT Visit Diagnosis: Unsteadiness on feet (R26.81);Other abnormalities of gait and mobility (R26.89);History of falling (Z91.81)   Activity Tolerance Patient tolerated treatment well   Patient Left in bed;with call bell/phone within reach   Nurse Communication Mobility status        Time: 1245-1300 OT Time Calculation (min): 15 min  Charges: OT General Charges $OT Visit: 1 Visit OT Treatments $Self Care/Home Management : 8-22 mins  Kari Baars, [place] [phone] Office- [phone]      [place], Edwena Felty D 10/25/2017, 3:40 PM

## 2017-10-25 NOTE — Care Management Note (Signed)
Case Management Note  Patient Details  Name: [name] MRN: [id number] Date of Birth: November 30, 1950  Subjective/Objective:    Pt admitted with COPD                Action/Plan: Pt discharging home with [facility] for HHPT/OT/CSW. Pt has a PCS/2hrs a day to come to the home.    Expected Discharge Date:  10/25/17               Expected Discharge Plan:  [place]  In-House Referral:     Discharge planning Services  CM Consult  Post Acute Care Choice:    Choice offered to:     DME Arranged:    DME Agency:     HH Arranged:  PT, OT, Nurse's Aide, Social Work [employer] Agency:  [place]  Status of Service:  In process, will continue to follow  If discussed at Long Length of Stay Meetings, dates discussed:    Additional CommentsPurcell Mouton, RN 10/25/2017, 2:19 PM

## 2017-10-25 NOTE — Progress Notes (Addendum)
PT Cancellation Note  Patient Details Name: [name] MRN: [id number] DOB: [birth year]   Cancelled Treatment:    Reason Eval/Treat Not Completed: PT screened, no needs identified, will sign off. Spoke with pt who denied need for PT services. Pt initially declined participation then stated she did not need PT. Will sign off at pt's request.    Weston Anna, PT Acute Rehabilitation Services Pager: [phone] Office: [phone]

## 2017-10-26 ENCOUNTER — Telehealth: Payer: Self-pay

## 2017-10-26 NOTE — Telephone Encounter (Signed)
The pt was discharged from our practice in 2012.  Dr Ardis Hughs notified and he has advised Dr Benay Spice that she will need to see [facility] or other in the area.

## 2017-10-26 NOTE — Telephone Encounter (Signed)
-----   Message from Janice Banister, MD sent at 10/26/2017  7:43 AM EDT ----- Chong Sicilian, She was discussed at today's GI cancer conference and needs ngi appt with first available provider (MD or APP) for lung mass (adenocarcinoma of unknown primary, current staining suggests colon primary). Probably needs colonoscopy +/- EGD.   Thanks    Leta Speller get her in.

## 2017-10-27 DIAGNOSIS — R918 Other nonspecific abnormal finding of lung field: Secondary | ICD-10-CM

## 2017-10-27 NOTE — Progress Notes (Signed)
Referral to [facility] placed. Scheduling message sent for assistance in referral.

## 2017-10-28 DIAGNOSIS — Z510 Encounter for antineoplastic radiation therapy: Secondary | ICD-10-CM | POA: Diagnosis not present

## 2017-10-29 ENCOUNTER — Other Ambulatory Visit: Payer: Self-pay | Admitting: Family Medicine

## 2017-10-29 DIAGNOSIS — R112 Nausea with vomiting, unspecified: Secondary | ICD-10-CM

## 2017-10-31 ENCOUNTER — Other Ambulatory Visit: Payer: Self-pay | Admitting: Family Medicine

## 2017-10-31 ENCOUNTER — Encounter (HOSPITAL_COMMUNITY): Payer: Self-pay | Admitting: Oncology

## 2017-10-31 ENCOUNTER — Telehealth: Payer: Self-pay

## 2017-10-31 DIAGNOSIS — R112 Nausea with vomiting, unspecified: Secondary | ICD-10-CM

## 2017-11-01 ENCOUNTER — Ambulatory Visit: Payer: Medicaid Other | Admitting: Radiation Oncology

## 2017-11-01 ENCOUNTER — Ambulatory Visit
Admission: RE | Admit: 2017-11-01 | Discharge: 2017-11-01 | Disposition: A | Discharge status: Home / Self Care | Payer: Medicaid Other | Source: Ambulatory Visit | Attending: Radiation Oncology | Admitting: Radiation Oncology

## 2017-11-01 ENCOUNTER — Other Ambulatory Visit: Payer: Self-pay

## 2017-11-01 DIAGNOSIS — Z510 Encounter for antineoplastic radiation therapy: Secondary | ICD-10-CM | POA: Diagnosis not present

## 2017-11-01 MED ORDER — PROAIR HFA 108 (90 BASE) MCG/ACT IN AERS: 1.0000 | INHALATION_SPRAY | RESPIRATORY_TRACT | 5 refills | Status: AC | PRN

## 2017-11-01 NOTE — Telephone Encounter (Signed)
Medication sent to pharmacy  

## 2017-11-02 ENCOUNTER — Ambulatory Visit: Payer: Medicaid Other

## 2017-11-03 ENCOUNTER — Ambulatory Visit
Admission: RE | Admit: 2017-11-03 | Discharge: 2017-11-03 | Disposition: A | Discharge status: Home / Self Care | Payer: Medicaid Other | Source: Ambulatory Visit | Attending: Radiation Oncology | Admitting: Radiation Oncology

## 2017-11-03 DIAGNOSIS — Z510 Encounter for antineoplastic radiation therapy: Secondary | ICD-10-CM | POA: Diagnosis not present

## 2017-11-04 ENCOUNTER — Ambulatory Visit: Payer: Medicaid Other

## 2017-11-07 ENCOUNTER — Ambulatory Visit
Admission: RE | Admit: 2017-11-07 | Discharge: 2017-11-07 | Disposition: A | Discharge status: Home / Self Care | Payer: Medicaid Other | Source: Ambulatory Visit | Attending: Radiation Oncology | Admitting: Radiation Oncology

## 2017-11-07 DIAGNOSIS — C7801 Secondary malignant neoplasm of right lung: Secondary | ICD-10-CM | POA: Insufficient documentation

## 2017-11-07 DIAGNOSIS — Z8553 Personal history of malignant neoplasm of renal pelvis: Secondary | ICD-10-CM | POA: Insufficient documentation

## 2017-11-07 DIAGNOSIS — Z510 Encounter for antineoplastic radiation therapy: Secondary | ICD-10-CM | POA: Diagnosis not present

## 2017-11-07 DIAGNOSIS — C801 Malignant (primary) neoplasm, unspecified: Secondary | ICD-10-CM | POA: Insufficient documentation

## 2017-11-08 ENCOUNTER — Telehealth: Payer: Self-pay

## 2017-11-08 ENCOUNTER — Telehealth: Payer: Self-pay | Admitting: *Deleted

## 2017-11-08 ENCOUNTER — Ambulatory Visit: Payer: Self-pay | Admitting: Oncology

## 2017-11-08 NOTE — Telephone Encounter (Signed)
Called and spoke with patient. She will keep appointment for 11/09/2017. Thanks!

## 2017-11-08 NOTE — Telephone Encounter (Signed)
Called patient to review appointment change for 11/09/17 and spoke to husband. He expressed frustration at nurse reporting this was the 4th call from this phone # he has had today. Is angry that he feels his wife's pain is not being addressed. Pain management is having her take something to get off narcotics. He feels providers have not been honest with him regarding her diagnosis and prognosis. Husband speaking very loudly over nurse and repeatedly says "can I talk now? Listen to me." Expressed displeasure of all the providers they have met thus far. He agreed to move her appointment to 12:30 after she sees Dr. Lisbeth Renshaw and nurse encouraged him to make a list of all the questions he has for the appointment tomorrow.

## 2017-11-08 NOTE — Telephone Encounter (Signed)
Left VM for husband to inquire if patient is still planning to come in today at 2:30 pm? He wants to see her today. Requested he return call to confirm or reschedule.

## 2017-11-09 ENCOUNTER — Ambulatory Visit
Admission: RE | Admit: 2017-11-09 | Discharge: 2017-11-09 | Disposition: A | Discharge status: Home / Self Care | Payer: Medicaid Other | Source: Ambulatory Visit | Attending: Radiation Oncology | Admitting: Radiation Oncology

## 2017-11-09 ENCOUNTER — Telehealth: Payer: Self-pay

## 2017-11-09 ENCOUNTER — Ambulatory Visit (INDEPENDENT_AMBULATORY_CARE_PROVIDER_SITE_OTHER): Payer: Medicaid Other | Admitting: Family Medicine

## 2017-11-09 ENCOUNTER — Encounter: Payer: Self-pay | Admitting: Family Medicine

## 2017-11-09 ENCOUNTER — Inpatient Hospital Stay: Payer: Medicaid Other | Attending: Oncology | Admitting: Oncology

## 2017-11-09 VITALS — BP 126/80 | HR 80 | Temp 97.4°F | Ht 64.0 in | Wt 72.6 lb

## 2017-11-09 VITALS — BP 130/85 | HR 96 | Resp 18 | Ht 64.0 in | Wt 70.8 lb

## 2017-11-09 DIAGNOSIS — K859 Acute pancreatitis without necrosis or infection, unspecified: Secondary | ICD-10-CM

## 2017-11-09 DIAGNOSIS — J449 Chronic obstructive pulmonary disease, unspecified: Secondary | ICD-10-CM

## 2017-11-09 DIAGNOSIS — Z903 Acquired absence of stomach [part of]: Secondary | ICD-10-CM | POA: Insufficient documentation

## 2017-11-09 DIAGNOSIS — G894 Chronic pain syndrome: Secondary | ICD-10-CM | POA: Diagnosis not present

## 2017-11-09 DIAGNOSIS — C642 Malignant neoplasm of left kidney, except renal pelvis: Secondary | ICD-10-CM | POA: Insufficient documentation

## 2017-11-09 DIAGNOSIS — R918 Other nonspecific abnormal finding of lung field: Secondary | ICD-10-CM

## 2017-11-09 DIAGNOSIS — Z09 Encounter for follow-up examination after completed treatment for conditions other than malignant neoplasm: Secondary | ICD-10-CM

## 2017-11-09 DIAGNOSIS — D649 Anemia, unspecified: Secondary | ICD-10-CM | POA: Diagnosis not present

## 2017-11-09 DIAGNOSIS — C7802 Secondary malignant neoplasm of left lung: Secondary | ICD-10-CM

## 2017-11-09 DIAGNOSIS — I10 Essential (primary) hypertension: Secondary | ICD-10-CM

## 2017-11-09 DIAGNOSIS — R0602 Shortness of breath: Secondary | ICD-10-CM | POA: Diagnosis not present

## 2017-11-09 DIAGNOSIS — Z510 Encounter for antineoplastic radiation therapy: Secondary | ICD-10-CM | POA: Diagnosis not present

## 2017-11-09 DIAGNOSIS — F1129 Opioid dependence with unspecified opioid-induced disorder: Secondary | ICD-10-CM

## 2017-11-09 DIAGNOSIS — R110 Nausea: Secondary | ICD-10-CM

## 2017-11-09 DIAGNOSIS — F411 Generalized anxiety disorder: Secondary | ICD-10-CM

## 2017-11-09 MED ORDER — ONDANSETRON HCL 4 MG PO TABS: 4.0000 mg | ORAL_TABLET | Freq: Every day | ORAL | 6 refills | Status: DC | PRN

## 2017-11-09 NOTE — Progress Notes (Signed)
Follow Up  Subjective:    Patient ID: [name], female    DOB: 05-18-1950, 67 y.o.   MRN: [id number]  Chief Complaint  Patient presents with  . Follow-up    chronic condition   HPI  [name] is a 67 year old female with a past medical history of Substance Abuse, Seizures, Pancreatitis, Drug-Seeker, COPD, and Cancer. She is here today for follow up assessment of chronic diseases.   Current Status: Since her last office visit. On 09/15/2017 a chest x-ray revealed a right lower lobe infiltrate and a mass in the left lower lobe.  A CT of the chest on 09/18/2017 revealed a 3.1 cm left lower lobe mass. Lung cancer is considered secondary, and patient is currently scheduled for GI consult for Colonoscopy. She have chronic nausea and diarrhea. No reports of GI problems such as n vomiting, and constipation. She has no reports of blood in stools, dysuria and hematuria. She is currently on continuous oxygen of 2 liters provided by [facility]. She is eating at 6 small meals daily with 1-2 Ensure supplements. She continues to follow up with pain management where she is receiving Suboxone treatments. Her next appointment is on 01/23/2017. She has increased anxiety as r/t her health status and chronic pain.  She continues to follow up with Dr. Benay Spice in Oncology. She continues to receive Radiation treatments, has received 4 txs thus far, with a goal of 5 total txs, per Dr. Lisbeth Renshaw. She is tolerating txs well. She is accompanied by her husband today.  She denies fevers, chills, fatigue, recent infections, weight loss, and night sweats. She has not had any headaches, visual changes, dizziness, and falls. No chest pain, heart palpitations, cough and shortness of breath reported.  Past Medical History:  Diagnosis Date  . Cancer ([place])    renal ca  . COPD (chronic obstructive pulmonary disease) ([place])   . Drug-seeking behavior   . Pancreatitis   . Pancreatitis   . Seizures ([place])   . Substance  abuse ([place])     Family History  Problem Relation Age of Onset  . CAD Mother   . Alcoholism Father   . COPD Father   . Lung cancer Father   . Rectal cancer Maternal Uncle     Social History   Socioeconomic History  . Marital status: Married    Spouse name: Not on file  . Number of children: Not on file  . Years of education: Not on file  . Highest education level: Not on file  Occupational History  . Not on file  Social Needs  . Financial resource strain: Not on file  . Food insecurity:    Worry: Not on file    Inability: Not on file  . Transportation needs:    Medical: No    Non-medical: No  Tobacco Use  . Smoking status: Current Every Day Smoker    Packs/day: 0.50    Years: 30.00    Pack years: 15.00    Types: Cigarettes  . Smokeless tobacco: Never Used  . Tobacco comment: smoking up to 1.5 ppd per husband. down from 3pk day to 1.5   Substance and Sexual Activity  . Alcohol use: Not Currently    Alcohol/week: 0.0 standard drinks    Comment: "I stopped drinking 1 year ago"  . Drug use: No    Comment: Hx of polysubstance drug abuse  . Sexual activity: Never  Lifestyle  . Physical activity:    Days  per week: Not on file    Minutes per session: Not on file  . Stress: Not on file  Relationships  . Social connections:    Talks on phone: Not on file    Gets together: Not on file    Attends religious service: Not on file    Active member of club or organization: Not on file    Attends meetings of clubs or organizations: Not on file    Relationship status: Not on file  . Intimate partner violence:    Fear of current or ex partner: Not on file    Emotionally abused: Not on file    Physically abused: Not on file    Forced sexual activity: Not on file  Other Topics Concern  . Not on file  Social History Narrative  . Not on file   Past Surgical History:  Procedure Laterality Date  . HIP SURGERY Bilateral   . IR GENERIC HISTORICAL  05/08/2015   IR RADIOLOGIST  EVAL & MGMT 05/08/2015 Aletta Edouard, MD [facility] INTERV RAD  . IR GENERIC HISTORICAL  01/08/2014   IR RADIOLOGIST EVAL & MGMT 01/08/2014 Aletta Edouard, MD [facility] INTERV RAD  . IR RADIOLOGY PERIPHERAL GUIDED IV START  10/03/2017  . IR RADIOLOGY PERIPHERAL GUIDED IV START  10/10/2017  . IR US GUIDE VASC ACCESS RIGHT  10/05/2017  . IR US GUIDE VASC ACCESS RIGHT  10/10/2017  . KIDNEY SURGERY     removed cancerous lesions  . PARTIAL GASTRECTOMY      Immunization History  Administered Date(s) Administered  . Influenza Split 10/22/2013, 10/27/2014, 08/05/2015  . Influenza,inj,Quad PF,6+ Mos 09/21/2016, 09/19/2017  . Pneumococcal Conjugate-13 09/02/2014  . Pneumococcal Polysaccharide-23 02/11/2013    Current Meds  Medication Sig  . famotidine (PEPCID) 20 MG tablet Take 1 tablet (20 mg total) by mouth daily. (Patient taking differently: Take 20 mg by mouth daily as needed for heartburn or indigestion. )  . feeding supplement, ENSURE ENLIVE, (ENSURE ENLIVE) LIQD Take 237 mLs by mouth 3 (three) times daily between meals.  Marland Kitchen ipratropium-albuterol (DUONEB) 0.5-2.5 (3) MG/3ML SOLN Take 3 mLs by nebulization every 6 (six) hours as needed. Use 3 times daily x 4 days, then every 6 hours as needed.  Marland Kitchen LORazepam (ATIVAN) 1 MG tablet Take 1 tablet (1 mg total) by mouth every 8 (eight) hours as needed for anxiety.  Marland Kitchen losartan (COZAAR) 50 MG tablet Take 50 mg by mouth daily.  . Multiple Vitamin (MULTIVITAMIN WITH MINERALS) TABS tablet Take 1 tablet by mouth daily.  . ondansetron (ZOFRAN) 4 MG tablet Take 1 tablet (4 mg total) by mouth daily as needed.  Marland Kitchen PROAIR HFA 108 (90 Base) MCG/ACT inhaler Inhale 1 puff into the lungs every 4 (four) hours as needed for wheezing or shortness of breath.  . SUBOXONE 8-2 MG FILM Take 8 mg by mouth daily.  Marland Kitchen tiotropium (SPIRIVA HANDIHALER) 18 MCG inhalation capsule PLACE 1 CAPSULE INTO INHALER AND INHALE DAILY  . [DISCONTINUED] ondansetron (ZOFRAN) 4 MG tablet Take 4 mg by mouth  daily as needed for nausea/vomiting.    Allergies  Allergen Reactions  . Aspirin Other (See Comments)    Break out in welts  . Chlorpromazine Hcl Other (See Comments)    Muscle spasms - "really bad"     BP 126/80 (BP Location: Left Arm, Patient Position: Sitting, Cuff Size: Small)   Pulse 80   Temp (!) 97.4 F (36.3 C) (Oral)   Ht 5\' 4"  (1.626 m)  Wt 72 lb 9.6 oz (32.9 kg)   SpO2 (!) 74%   BMI 12.46 kg/m    Review of Systems  Constitutional: Negative.   HENT: Negative.   Respiratory: Negative.   Cardiovascular: Negative.   Gastrointestinal: Positive for nausea.  Genitourinary: Negative.   Musculoskeletal: Negative.   Skin: Negative.   Allergic/Immunologic: Negative.   Neurological: Negative.   Hematological: Negative.   Psychiatric/Behavioral: The patient is nervous/anxious.        Objective:   Physical Exam  Constitutional: She is oriented to person, place, and time. She appears well-developed and well-nourished.  HENT:  Head: Normocephalic and atraumatic.  Eyes: Pupils are equal, round, and reactive to light. EOM are normal.  Neck: Normal range of motion. Neck supple.  Cardiovascular: Normal rate, regular rhythm, normal heart sounds and intact distal pulses.  Pulmonary/Chest: Effort normal and breath sounds normal.  Abdominal: Soft. Bowel sounds are normal.  Musculoskeletal: Normal range of motion.  Neurological: She is alert and oriented to person, place, and time.  Skin: Skin is warm and dry.  Psychiatric: She has a normal mood and affect. Her behavior is normal. Judgment and thought content normal.  Nursing note and vitals reviewed.  Assessment & Plan:   1. Shortness of breath Stable. Continue continuous oxygen at 2 liters.   2. Chronic obstructive pulmonary disease, unspecified COPD type ([place])  3. HTN (hypertension), malignant Blood pressure is stable at 126/80. Continue Cozaar as prescribed. She will continue to decrease high sodium intake,  excessive alcohol intake, increase potassium intake, smoking cessation, and increase physical activity of at least 30 minutes of cardio activity daily. She will continue to follow Heart Healthy or DASH diet.  4. Chronic pain syndrome She continues Suboxone as prescribed by pain management. Keep follow up appointments.   5. Opioid dependence with opioid-induced disorder ([place]) Continue with Pain Management physician.   6. Anxiety state Decreased. Continue Ativan as prescribed by Psychiatrist.   7. GAD (generalized anxiety disorder)  8. Nausea Stable today.  - ondansetron (ZOFRAN) 4 MG tablet; Take 1 tablet (4 mg total) by mouth daily as needed.  Dispense: 20 tablet; Refill: 6  9. Follow up Follow up in 3 months.   Meds ordered this encounter  Medications  . ondansetron (ZOFRAN) 4 MG tablet    Sig: Take 1 tablet (4 mg total) by mouth daily as needed.    Dispense:  20 tablet    Refill:  [place],  MSN, [facility] [address] [place], [place] [address] [phone]

## 2017-11-09 NOTE — Progress Notes (Signed)
[facility] OFFICE PROGRESS NOTE   Diagnosis: Lung mass  INTERVAL HISTORY:   [name] returns for scheduled visit.  She is completing radiation to the left lung mass.  A final treatment is scheduled for 11/11/2017.  She complains of pain at multiple sites including the sacrum, bilateral chest wall, and back.  She is not taking pain medication at present. Her case was presented at the GI tumor conference last month.  Upper endoscopy and colonoscopy were recommended.  We made referrals to [facility] and [facility] gastroenterology.  Both this is declined seeing her.  Additional immunohistochemical veins on the lung biopsy of the tumor to be positive for cytokeratin 7, CDX (patchy weak), PAX 8 (patchy week).  The tumor is negative for cytokeratin 20, TTF-1, and Napsin A.  No specific primary tumor site has been defined.  He was admitted with a COPD exacerbation on 10/23/2017. Objective:  Vital signs in last 24 hours:  Blood pressure 130/85, pulse 96, resp. rate 18, height 5' 4"  (1.626 m), weight 70 lb 12.8 oz (32.1 kg), SpO2 99 %.   Resp: Distant breath sounds with expiratory wheeze on the right greater than left chest, no respiratory distress Cardio: Regular rate and rhythm GI: No hepatomegaly, nontender Vascular: No leg edema   Lab Results:  Lab Results  Component Value Date   WBC 8.2 10/25/2017   HGB 10.5 (L) 10/25/2017   HCT 36.0 10/25/2017   MCV 94.0 10/25/2017   PLT 230 10/25/2017   NEUTROABS 6.1 10/25/2017    CMP  Lab Results  Component Value Date   NA 138 10/25/2017   K 4.5 10/25/2017   CL 113 (H) 10/25/2017   CO2 21 (L) 10/25/2017   GLUCOSE 107 (H) 10/25/2017   BUN 19 10/25/2017   CREATININE 0.77 10/25/2017   CALCIUM 9.0 10/25/2017   PROT 6.4 (L) 10/24/2017   ALBUMIN 3.3 (L) 10/24/2017   AST 18 10/24/2017   ALT 12 10/24/2017   ALKPHOS 71 10/24/2017   BILITOT 0.0 (L) 10/24/2017   GFRNONAA >60 10/25/2017   GFRAA >60 10/25/2017    Lab Results    Component Value Date   CEA1 7.08 (H) 10/19/2017    Lab Results  Component Value Date   INR 0.94 10/01/2017    Imaging:  No results found.  Medications: I have reviewed the patient's current medications.   Assessment/Plan: 1. Left lung mass-CT biopsy 10/10/2017 revealed metastatic adenocarcinoma, focal CDX-2 positivity, TTF-1 Napsin A, and cytokeratin 20 negative.  Positive for cytokeratin 7 and PAX 8 (patchy week)  PD-L1 score 0%, MSS, tumor mutation burden 8, BRIP1, no KRAS , BRAF, or NRAS mutation  SBRT to left lung mass beginning 11/01/2017 2. PET scan 10/03/2017-hypermetabolic 3 similar left lower lobe mass, low level metabolic activity associated with bibasilar nodular and bandlike densities felt to be inflammatory, high activity in the tongue and neck musculature 3. COPD 4. Pancreatitis 5. Chronic pain syndrome 6. Left renal cell carcinoma treated with RFA in 2009 and cryoablation in 2012 7. Remote history of heavy alcohol use 8. Ongoing tobacco use 9. History of a partial gastrectomy 10. Anemia    Disposition: [name] appears unchanged.  She is completing SBRT to the left lung mass.  The primary tumor site remains unknown.  Her case was presented at the GI tumor conference.  Upper and lower endoscopy is recommended.  I will contact gastroenterology to see if this can be scheduled within the next few weeks.  I reviewed the  additional immunohistochemical stains and Foundation 1 results with [name] and her husband.  She does not appear to be an ideal candidate for immunotherapy or targeted therapy.  She will return for an office visit in approximately 1 month.  She will follow-up at her primary provider and the pain clinic for management of chronic pain and anxiety. 25 minutes were spent with the patient today.  The majority of the time was used for counseling and coordination of care.  Betsy Coder, MD  11/09/2017  12:45 PM

## 2017-11-09 NOTE — Progress Notes (Signed)
Spoke with both [facility] and [facility]. Patient has been fired from both practices. Colonoscopy needed. Dr. Benay Spice aware.

## 2017-11-09 NOTE — Telephone Encounter (Signed)
Printed avs and calender of upcoming appointment. Per 11/6 los

## 2017-11-09 NOTE — Progress Notes (Signed)
  Oncology Nurse Navigator Documentation   Met with patient and husband during follow-up appointment along with Dr. Benay Spice. Husband animated and forcefull as well as focused on receiving pain medications. Patient was asked if she was still a patient at [facility] pain clinic but they would not answer directly. Husband told wife to be quiet. Patient has been discharged from both [facility] and [facility]. Colonoscopy and endoscopy still to be scheduled.

## 2017-11-10 ENCOUNTER — Telehealth: Payer: Self-pay

## 2017-11-10 NOTE — Telephone Encounter (Signed)
Beverlee Nims from [facility] states that patient refused sickle therapy and they will be discharging patient.   [phone].

## 2017-11-11 ENCOUNTER — Ambulatory Visit
Admission: RE | Admit: 2017-11-11 | Discharge: 2017-11-11 | Disposition: A | Discharge status: Home / Self Care | Payer: Medicaid Other | Source: Ambulatory Visit | Attending: Radiation Oncology | Admitting: Radiation Oncology

## 2017-11-11 ENCOUNTER — Encounter: Payer: Self-pay | Admitting: Radiation Oncology

## 2017-11-11 ENCOUNTER — Encounter: Payer: Self-pay | Admitting: *Deleted

## 2017-11-11 DIAGNOSIS — Z510 Encounter for antineoplastic radiation therapy: Secondary | ICD-10-CM | POA: Diagnosis not present

## 2017-11-11 NOTE — Progress Notes (Signed)
Husband requesting information on when and where his wife's last colonoscopy was done. Dr. Paulita Fujita agrees to perform this for them, but wants a copy of the last one done. Per chart review and confirmation by Dr. Benay Spice, there is no record of colonoscopy on this patient. Was noted she had upper endoscopy with [facility] in 2002. Husband was given this information.

## 2017-11-14 ENCOUNTER — Encounter (HOSPITAL_COMMUNITY): Payer: Self-pay | Admitting: Oncology

## 2017-11-14 ENCOUNTER — Emergency Department (HOSPITAL_COMMUNITY)
Admission: EM | Admit: 2017-11-14 | Discharge: 2017-11-14 | Disposition: A | Discharge status: Home / Self Care | Payer: Medicaid Other | Attending: Emergency Medicine | Admitting: Emergency Medicine

## 2017-11-14 ENCOUNTER — Emergency Department (HOSPITAL_COMMUNITY): Payer: Medicaid Other

## 2017-11-14 ENCOUNTER — Encounter (HOSPITAL_COMMUNITY): Payer: Self-pay | Admitting: Emergency Medicine

## 2017-11-14 ENCOUNTER — Other Ambulatory Visit: Payer: Self-pay

## 2017-11-14 DIAGNOSIS — Z79899 Other long term (current) drug therapy: Secondary | ICD-10-CM | POA: Diagnosis not present

## 2017-11-14 DIAGNOSIS — F17210 Nicotine dependence, cigarettes, uncomplicated: Secondary | ICD-10-CM | POA: Diagnosis not present

## 2017-11-14 DIAGNOSIS — Z85118 Personal history of other malignant neoplasm of bronchus and lung: Secondary | ICD-10-CM | POA: Diagnosis not present

## 2017-11-14 DIAGNOSIS — R109 Unspecified abdominal pain: Secondary | ICD-10-CM | POA: Diagnosis not present

## 2017-11-14 DIAGNOSIS — J449 Chronic obstructive pulmonary disease, unspecified: Secondary | ICD-10-CM | POA: Diagnosis not present

## 2017-11-14 DIAGNOSIS — I10 Essential (primary) hypertension: Secondary | ICD-10-CM | POA: Diagnosis not present

## 2017-11-14 DIAGNOSIS — R079 Chest pain, unspecified: Secondary | ICD-10-CM | POA: Diagnosis present

## 2017-11-14 LAB — COMPREHENSIVE METABOLIC PANEL
ALBUMIN: 4.5 g/dL (ref 3.5–5.0)
ALK PHOS: 79 U/L (ref 38–126)
ALT: 11 U/L (ref 0–44)
AST: 17 U/L (ref 15–41)
Anion gap: 10 (ref 5–15)
BUN: 15 mg/dL (ref 8–23)
CALCIUM: 10.1 mg/dL (ref 8.9–10.3)
CHLORIDE: 101 mmol/L (ref 98–111)
CO2: 25 mmol/L (ref 22–32)
Creatinine, Ser: 0.93 mg/dL (ref 0.44–1.00)
GFR calc Af Amer: >60 mL/min (ref >60)
GFR calc non Af Amer: >60 mL/min (ref >60)
GLUCOSE: 137 mg/dL — AB (ref 70–99)
Potassium: 4.3 mmol/L (ref 3.5–5.1)
SODIUM: 136 mmol/L (ref 135–145)
Total Bilirubin: 0.6 mg/dL (ref 0.3–1.2)
Total Protein: 8 g/dL (ref 6.5–8.1)

## 2017-11-14 LAB — CBC
HEMATOCRIT: 39.4 % (ref 36.0–46.0)
Hemoglobin: 12 g/dL (ref 12.0–15.0)
MCH: 27.5 pg (ref 26.0–34.0)
MCHC: 30.5 g/dL (ref 30.0–36.0)
MCV: 90.2 fL (ref 80.0–100.0)
NRBC: 0 % (ref 0.0–0.2)
Platelets: 215 10*3/uL (ref 150–400)
RBC: 4.37 MIL/uL (ref 3.87–5.11)
RDW: 14.9 % (ref 11.5–15.5)
WBC: 5.4 10*3/uL (ref 4.0–10.5)

## 2017-11-14 LAB — LIPASE, BLOOD: LIPASE: 21 U/L (ref 11–51)

## 2017-11-14 MED ORDER — MORPHINE SULFATE (PF) 4 MG/ML IV SOLN
4.0000 mg | Freq: Once | INTRAVENOUS | Status: AC
  Administered 2017-11-14: 4 mg via INTRAVENOUS
  Filled 2017-11-14: qty 1

## 2017-11-14 MED ORDER — METHYLPREDNISOLONE SODIUM SUCC 125 MG IJ SOLR
80.0000 mg | Freq: Once | INTRAMUSCULAR | Status: AC
  Administered 2017-11-14: 80 mg via INTRAVENOUS
  Filled 2017-11-14: qty 2

## 2017-11-14 MED ORDER — ALBUTEROL SULFATE (2.5 MG/3ML) 0.083% IN NEBU
5.0000 mg | INHALATION_SOLUTION | Freq: Once | RESPIRATORY_TRACT | Status: DC
  Filled 2017-11-14: qty 6

## 2017-11-14 MED ORDER — SODIUM CHLORIDE (PF) 0.9 % IJ SOLN
INTRAMUSCULAR | Status: AC
  Filled 2017-11-14: qty 50

## 2017-11-14 MED ORDER — IOPAMIDOL (ISOVUE-300) INJECTION 61%
INTRAVENOUS | Status: AC
  Filled 2017-11-14: qty 100

## 2017-11-14 MED ORDER — OXYCODONE-ACETAMINOPHEN 5-325 MG PO TABS: 1.0000 | ORAL_TABLET | Freq: Four times a day (QID) | ORAL | 0 refills | Status: DC | PRN

## 2017-11-14 MED ORDER — IPRATROPIUM-ALBUTEROL 0.5-2.5 (3) MG/3ML IN SOLN
3.0000 mL | Freq: Once | RESPIRATORY_TRACT | Status: DC
  Filled 2017-11-14: qty 3

## 2017-11-14 MED ORDER — HYDROMORPHONE HCL 1 MG/ML IJ SOLN
1.0000 mg | Freq: Once | INTRAMUSCULAR | Status: AC
  Administered 2017-11-14: 1 mg via INTRAMUSCULAR
  Filled 2017-11-14: qty 1

## 2017-11-14 MED ORDER — IOPAMIDOL (ISOVUE-300) INJECTION 61%
100.0000 mL | Freq: Once | INTRAVENOUS | Status: AC | PRN
  Administered 2017-11-14: 80 mL via INTRAVENOUS

## 2017-11-14 NOTE — ED Provider Notes (Signed)
[facility] DEPT Provider Note   CSN: [id number] Arrival date & time: 11/14/17  1139     History   Chief Complaint Chief Complaint  Patient presents with  . Shortness of Breath  . Abdominal Pain    HPI [name] is a 67 y.o. female.  Patient is a 67 year old female with past medical history of COPD, renal cell carcinoma, and lung cancer.  She presents today for evaluation of chest pain, abdominal pain, and difficulty breathing.  This began 2 weeks ago and is worsening.  She was recently admitted and underwent radiation treatment for a lung mass.  She denies any fevers or chills.  She denies any vomiting or diarrhea.  The history is provided by the patient.  Shortness of Breath  This is a recurrent problem. The average episode lasts 2 weeks. The problem occurs continuously.The problem has been gradually worsening. Associated symptoms include abdominal pain. She has tried nothing for the symptoms. The treatment provided no relief.  Abdominal Pain      Past Medical History:  Diagnosis Date  . Cancer ([place])    renal ca  . COPD (chronic obstructive pulmonary disease) ([place])   . Drug-seeking behavior   . Pancreatitis   . Pancreatitis   . Seizures ([place])   . Substance abuse [facility])     Patient Active Problem List   Diagnosis Date Noted  . Lung mass 10/04/2017  . Pneumonia 09/19/2017  . PNA (pneumonia) 09/17/2017  . Osteoarthritis of facet joint of lumbar spine 07/26/2017  . Low back pain radiating to both legs 07/26/2017  . 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 Failure to thrive in adult 06/21/2017  . Pressure injury of skin 04/06/2017  . Pressure injury of skin of right ischial tuberosity region: Stage 2 04/06/2017  . Acute urinary retention 04/04/2017  . Benzodiazepine dependence ([place])   . AKI (acute kidney injury) ([place]) 04/03/2017  . Hip fracture, right ([place]) 06/02/2016  . Narcotic addiction ([place])  02/29/2016  . Abdominal pain, chronic, epigastric- due to chronic pancreatitis 02/29/2016  . COPD (chronic obstructive pulmonary disease) ([place]) 02/28/2016  . Cough 09/03/2015  . Abdominal pain 01/03/2015  . Hyponatremia 01/03/2015  . Hypocalcemia 01/03/2015  . Underweight 01/03/2015  . COPD GOLD III with reversible component  07/17/2014  . DTs (delirium tremens) ([place]) 06/02/2013  . Chronic alcoholic pancreatitis ([place]) 06/02/2013  . Lactic acidosis 06/02/2013  . Recurrent acute pancreatitis 03/28/2013  . Alcohol withdrawal ([place]) 03/28/2013  . Pancreatitis 03/09/2013  . Tobacco abuse 03/09/2013  . Alcohol withdrawal syndrome with perceptual disturbance ([place]) 03/09/2013  . Acute alcoholic pancreatitis 77/82/4235  . Benzodiazepine withdrawal ([place]) 02/09/2013  . Protein-calorie malnutrition, severe ([place]) 02/09/2013  . Hypokalemia 02/27/2011  . Abdominal pain, acute 02/25/2011  . Nausea and vomiting 02/25/2011  . Thrombocytopenia ([place]) 02/25/2011  . COPD with acute exacerbation ([place]) 02/25/2011  . HTN (hypertension), malignant 02/25/2011  . Malignant neoplasm of kidney excluding renal pelvis ([place]) 10/18/2008  . Anxiety state 10/18/2008  . GERD 10/18/2008  . PANCREATITIS, CHRONIC- atrophic pancreas 10/18/2008  . DYSPNEA 10/18/2008  . GASTRIC ULCER, HX OF 10/18/2008    Past Surgical History:  Procedure Laterality Date  . HIP SURGERY Bilateral   . IR GENERIC HISTORICAL  05/08/2015   IR RADIOLOGIST EVAL & MGMT 05/08/2015 Aletta Edouard, MD [facility] INTERV RAD  . IR GENERIC HISTORICAL  01/08/2014   IR RADIOLOGIST EVAL & MGMT 01/08/2014 Aletta Edouard, MD [facility] INTERV RAD  . IR  RADIOLOGY PERIPHERAL GUIDED IV START  10/03/2017  . IR RADIOLOGY PERIPHERAL GUIDED IV START  10/10/2017  . IR US GUIDE VASC ACCESS RIGHT  10/05/2017  . IR US GUIDE VASC ACCESS RIGHT  10/10/2017  . KIDNEY SURGERY     removed cancerous lesions  . PARTIAL GASTRECTOMY       OB History   None      Home Medications      Prior to Admission medications   Medication Sig Start Date End Date Taking? Authorizing Provider  famotidine (PEPCID) 20 MG tablet Take 1 tablet (20 mg total) by mouth daily. Patient taking differently: Take 20 mg by mouth daily as needed for heartburn or indigestion.  08/28/17   Khatri, Hina, PA-C  feeding supplement, ENSURE ENLIVE, (ENSURE ENLIVE) LIQD Take 237 mLs by mouth 3 (three) times daily between meals. 10/25/17   Eugenie Filler, MD  ipratropium-albuterol (DUONEB) 0.5-2.5 (3) MG/3ML SOLN Take 3 mLs by nebulization every 6 (six) hours as needed. Use 3 times daily x 4 days, then every 6 hours as needed. 10/25/17   Eugenie Filler, MD  LORazepam (ATIVAN) 1 MG tablet Take 1 tablet (1 mg total) by mouth every 8 (eight) hours as needed for anxiety. 10/25/17   Eugenie Filler, MD  losartan (COZAAR) 50 MG tablet Take 50 mg by mouth daily.    [provider]  mirtazapine (REMERON) 15 MG tablet Take 15 mg by mouth daily. 11/03/17   [provider]  Multiple Vitamin (MULTIVITAMIN WITH MINERALS) TABS tablet Take 1 tablet by mouth daily. 10/26/17   Eugenie Filler, MD  ondansetron (ZOFRAN) 4 MG tablet Take 1 tablet (4 mg total) by mouth daily as needed. 11/09/17   Azzie Glatter, FNP  PROAIR HFA 108 [phone] Base) MCG/ACT inhaler Inhale 1 puff into the lungs every 4 (four) hours as needed for wheezing or shortness of breath. 11/01/17   Azzie Glatter, FNP  SUBOXONE 8-2 MG FILM Take 8 mg by mouth daily. 10/30/17   [provider]  tiotropium (SPIRIVA HANDIHALER) 18 MCG inhalation capsule PLACE 1 CAPSULE INTO INHALER AND INHALE DAILY 10/29/17   Eugenie Filler, MD    Family History Family History  Problem Relation Age of Onset  . CAD Mother   . Alcoholism Father   . COPD Father   . Lung cancer Father   . Rectal cancer Maternal Uncle     Social History Social History   Tobacco Use  . Smoking status: Current Every Day Smoker    Packs/day: 0.50     Years: 30.00    Pack years: 15.00    Types: Cigarettes  . Smokeless tobacco: Never Used  . Tobacco comment: smoking up to 1.5 ppd per husband. down from 3pk day to 1.5   Substance Use Topics  . Alcohol use: Not Currently    Alcohol/week: 0.0 standard drinks    Comment: "I stopped drinking 1 year ago"  . Drug use: No    Comment: Hx of polysubstance drug abuse     Allergies   Aspirin and Chlorpromazine hcl   Review of Systems Review of Systems  Respiratory: Positive for shortness of breath.   Gastrointestinal: Positive for abdominal pain.  All other systems reviewed and are negative.    Physical Exam Updated Vital Signs BP (!) 135/101 (BP Location: Right Arm)   Pulse (!) 128   Temp 98.4 F (36.9 C) (Oral)   Resp (!) 26   SpO2 (!) 89%  Comment: pt on home O2  Physical Exam  Constitutional: She is oriented to person, place, and time. No distress.  Patient is a cachectic 67 year old female in no acute distress.  She appears older than stated age and appears chronically ill.  HENT:  Head: Normocephalic and atraumatic.  Neck: Normal range of motion. Neck supple.  Cardiovascular: Normal rate and regular rhythm. Exam reveals no gallop and no friction rub.  No murmur heard. Pulmonary/Chest: Effort normal and breath sounds normal. No respiratory distress. She has no wheezes.  Abdominal: Soft. Bowel sounds are normal. She exhibits no distension. There is no tenderness.  Musculoskeletal: Normal range of motion.       Right lower leg: Normal. She exhibits no tenderness and no edema.       Left lower leg: Normal. She exhibits no tenderness and no edema.  Neurological: She is alert and oriented to person, place, and time.  Skin: Skin is warm and dry. She is not diaphoretic.  Nursing note and vitals reviewed.    ED Treatments / Results  Labs (all labs ordered are listed, but only abnormal results are displayed) Labs Reviewed  LIPASE, BLOOD  COMPREHENSIVE METABOLIC PANEL  CBC   URINALYSIS, ROUTINE W REFLEX MICROSCOPIC    EKG EKG Interpretation  Date/Time:  Monday November 14 2017 11:57:59 EST Ventricular Rate:  130 PR Interval:    QRS Duration: 67 QT Interval:  306 QTC Calculation: 450 R Axis:   83 Text Interpretation:  Sinus tachycardia LAE, consider biatrial enlargement Borderline right axis deviation Probable left ventricular hypertrophy Baseline wander in lead(s) III V3 likley similar to prior 10/19 Confirmed by Aletta Edouard [phone]) on 11/14/2017 12:05:26 PM   Radiology Dg Chest 2 View  Result Date: 11/14/2017 CLINICAL DATA:  Shortness of breath. EXAM: CHEST - 2 VIEW COMPARISON:  Radiographs and CT scan of October 23, 2017. FINDINGS: The heart size and mediastinal contours are within normal limits. Hyperinflation of the lungs is noted. No pneumothorax or pleural effusion is noted. Right lung is clear. Stable appearance of rounded mass seen medially in left lower lobe as described on prior radiograph and CT scan. The visualized skeletal structures are unremarkable. IMPRESSION: Stable left lower lobe mass is noted concerning for neoplasm. Hyperinflation of the lungs. No other abnormality seen in the chest. Electronically Signed   By: Marijo Conception, M.D.   On: 11/14/2017 12:52    Procedures Procedures (including critical care time)  Medications Ordered in ED Medications  albuterol (PROVENTIL) (2.5 MG/3ML) 0.083% nebulizer solution 5 mg (5 mg Nebulization Refused 11/14/17 1210)  ipratropium-albuterol (DUONEB) 0.5-2.5 (3) MG/3ML nebulizer solution 3 mL (has no administration in time range)  methylPREDNISolone sodium succinate (SOLU-MEDROL) 125 mg/2 mL injection 80 mg (has no administration in time range)     Initial Impression / Assessment and Plan / ED Course  I have reviewed the triage vital signs and the nursing notes.  Pertinent labs & imaging results that were available during my care of the patient were reviewed by me and considered in my  medical decision making (see chart for details).  Patient presents with complaints of abdominal pain that I feel is likely an exacerbation of her chronic pain related to cancer.  Her work-up reveals no elevation of white count, electrolytes that are essentially unremarkable, and lipase that is normal.  I had initially intended to perform a CT scan, however the patient's IV blew and she is adamant about not being stuck again.  I discussed the care  with the patient and she would like an additional dose of medicine for pain, but wants to go home with no further imaging.  She has had multiple CT scans in the past and I believe it is reasonable to forego a CT scan today and see how she does.  Final Clinical Impressions(s) / ED Diagnoses   Final diagnoses:  None    ED Discharge Orders    None       Veryl Speak, MD 11/14/17 1455

## 2017-11-14 NOTE — ED Notes (Signed)
Patient given discharge teaching and verbalized understanding. Patient taken out of ED with wheelchair.

## 2017-11-14 NOTE — ED Triage Notes (Signed)
Pt complaint of worsening SOB, generalized abdominal pain, and nausea of past two weeks. Denies CP.

## 2017-11-14 NOTE — Discharge Instructions (Addendum)
Percocet as prescribed.  Continue your home medications as previously prescribed.  Return to the ER if symptoms significantly worsen or change.

## 2017-11-14 NOTE — ED Notes (Signed)
Patient transported to CT 

## 2017-11-17 IMAGING — XA IR FLUORO GUIDE CV LINE*L*
1 series · 3 of 3 positions shown · non-contrast
Comparison: none

CLINICAL DATA: Lack of peripheral IV access and need for IV access
prior to contrast enhanced CT of the abdomen.

[Series 300: ir us guide vasc access right&&ir fluoro · 3 of 3 slices shown]
[im 1/3]
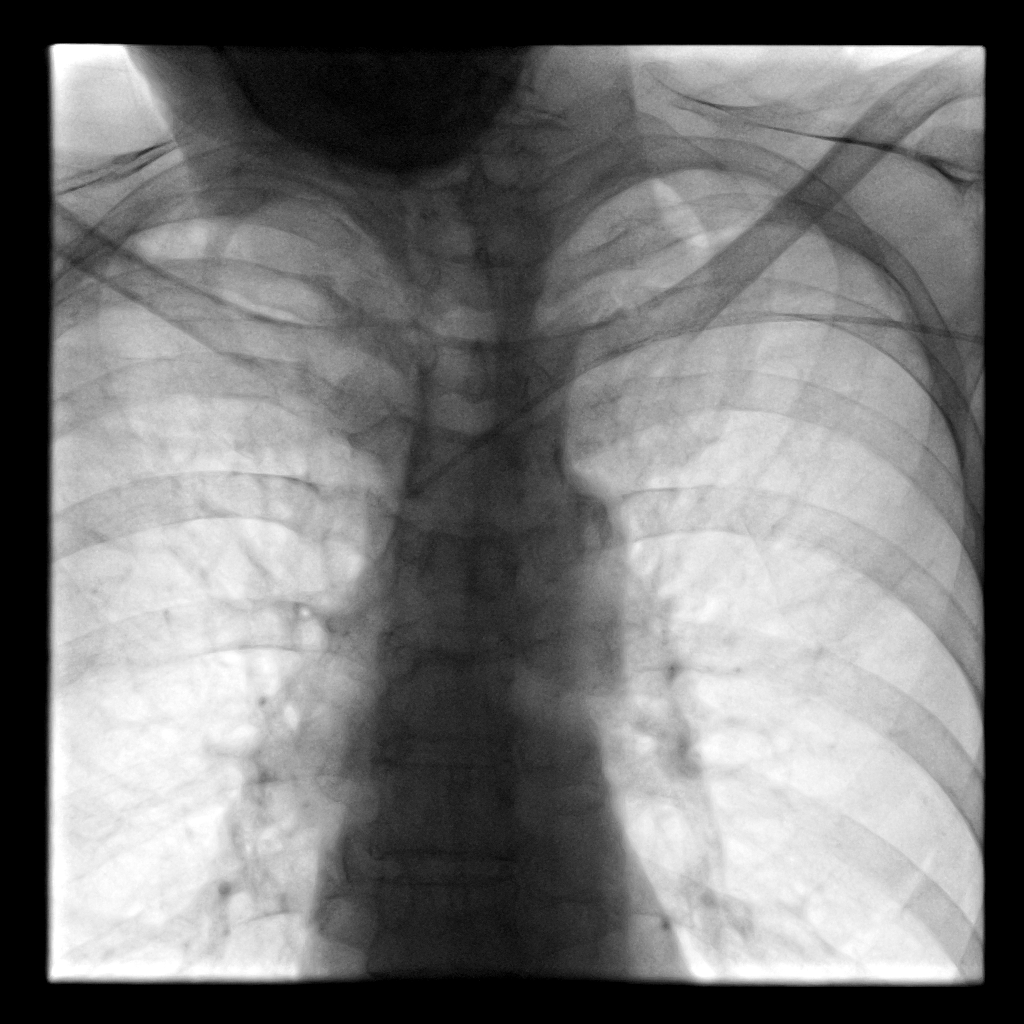
[im 2/3]
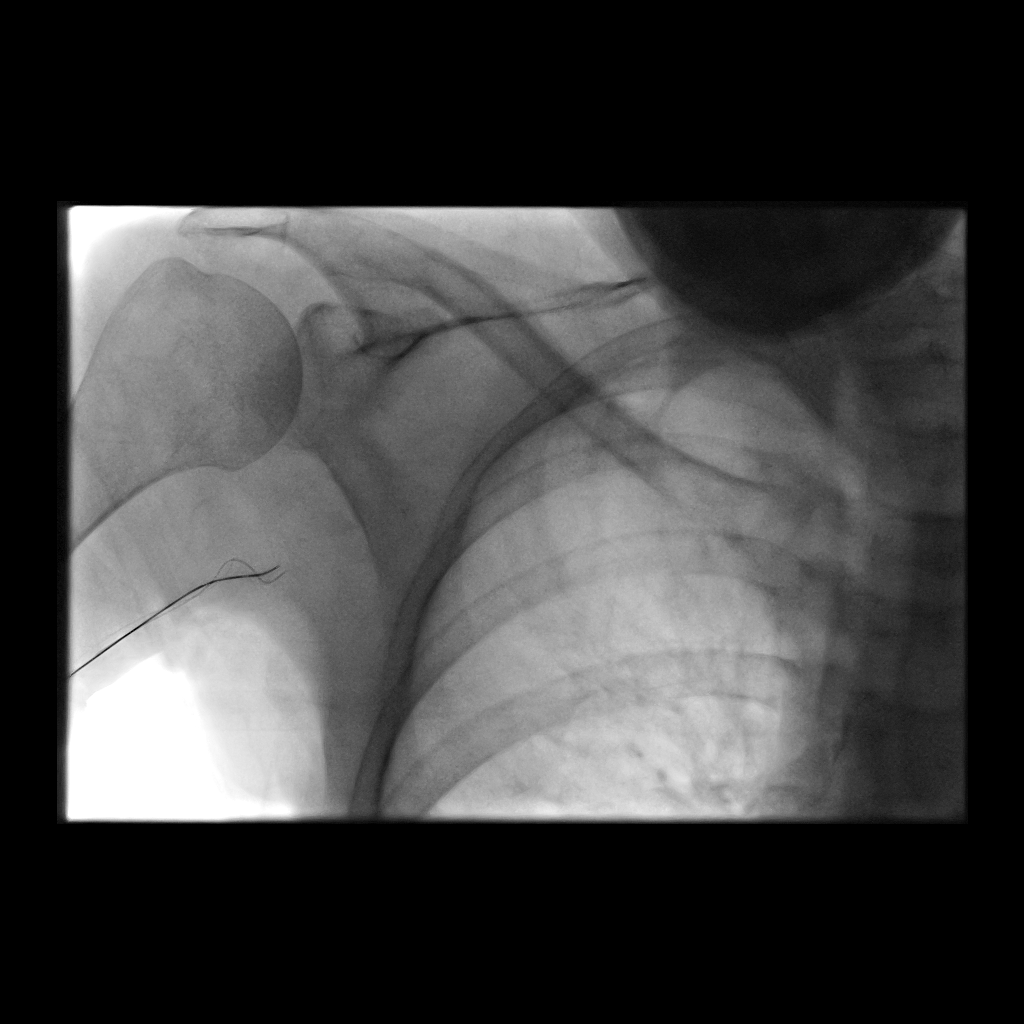
[im 3/3]
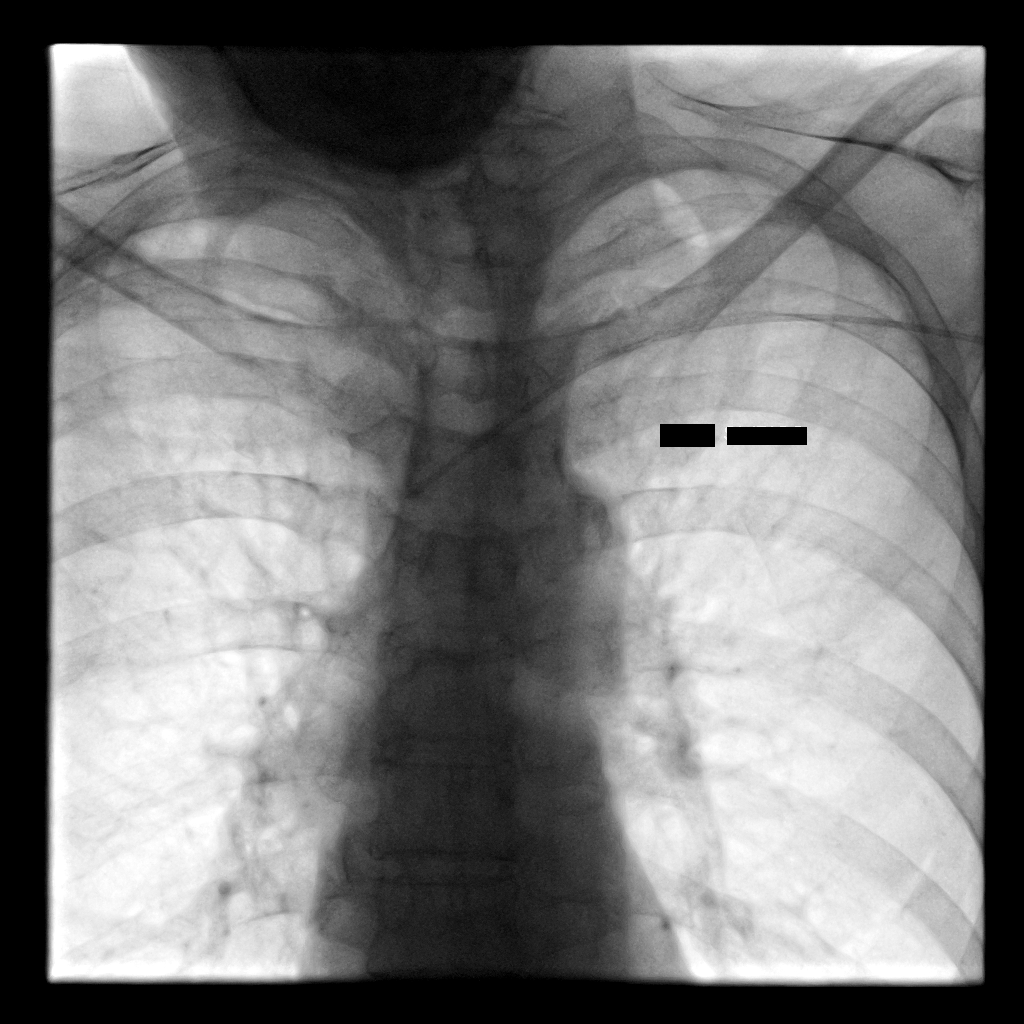

[3 of 3 positions shown; findings below may reference images not displayed]

EXAM:
PICC LINE PLACEMENT WITH ULTRASOUND AND FLUOROSCOPIC GUIDANCE

FLUOROSCOPY TIME:  18 seconds.

PROCEDURE:
The patient was advised of the possible risks and complications and
agreed to undergo the procedure. The patient was then brought to the
angiographic suite for the procedure. A time-out was performed prior
to initiating the procedure.

Both arms were prepped with chlorhexidine, draped in the usual
sterile fashion using maximum barrier technique (cap and mask,
sterile gown, sterile gloves, large sterile sheet, hand hygiene and
cutaneous antisepsis) and infiltrated locally with 1% Lidocaine.

Ultrasound demonstrated patency of the right brachial vein, and this
was documented with an image. Under real-time ultrasound guidance,
this vein was accessed with a 21 gauge micropuncture needle and
image documentation was performed. Initial attempt was made to
advance a 5 French PICC line via the right brachial vein over a
guidewire. Ultimately, the left arm was utilized with patency
documented of the left basilic vein. After access, a 22 cm
single-lumen power injectable PICC line was advanced through a
peel-away sheath to the level of the proximal SVC. Fluoroscopy
during the procedure and fluoro spot radiograph confirms appropriate
catheter position. The catheter was flushed and covered with a
sterile dressing.

COMPLICATIONS:
None
FINDINGS: Due to stricture of the right axillary vein, a right-sided PICC line
could not be successfully advanced into the central veins. There was
also no significant blood return at the level of the right axillary
vein on the right to utilize shorter positioned catheter for power
injection of IV contrast.

After left basilic vein access in the upper arm, a PICC line was
able to be advanced to the upper SVC.
IMPRESSION: Successful left arm power injectable PICC line placement with
ultrasound and fluoroscopic guidance. The catheter is ready for use.
Right-sided PICC line placement was unsuccessful due to axillary
vein stenosis. Future PICC line placement via the upper extremities
should be on the left side.

## 2017-11-17 IMAGING — CT CT ABDOMEN WO/W CM
3 of 11 series · 12 of 46 positions shown, 18 images · IV contrast (iopamidol)
Comparison: CT abdomen 12/11/2013.

CLINICAL DATA: Patient with history of left renal cell carcinoma
status post cryoablation. Follow-up evaluation.

EXAM:
CT ABDOMEN WITHOUT AND WITH CONTRAST
TECHNIQUE: Multidetector CT imaging of the abdomen was performed following the
standard protocol before and following the bolus administration of
intravenous contrast.
CONTRAST:  80mL [id number] IOPAMIDOL ([id number]) INJECTION 61%

[Series 2: non contrast 3.0 b30f · axial · 0.56mm/px · z∈[-318,-220]mm · 4 of 68 slices shown]
[im 12/68  soft-tissue]
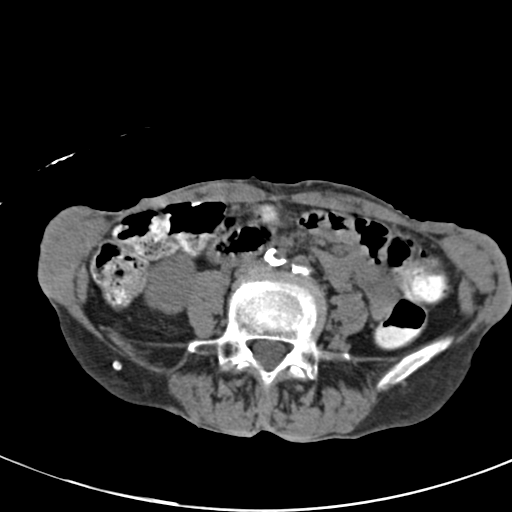
[im 23/68  soft-tissue]
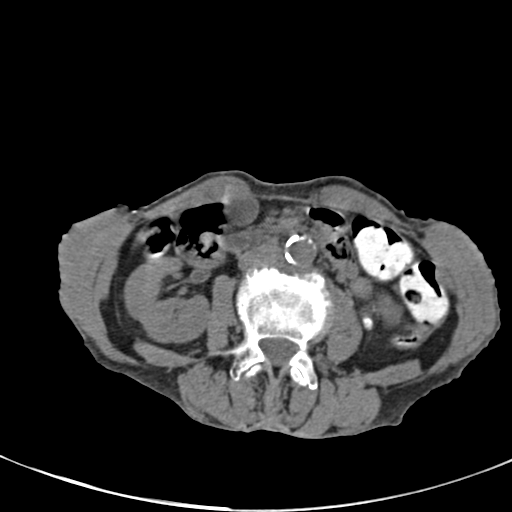
[im 34/68  soft-tissue]
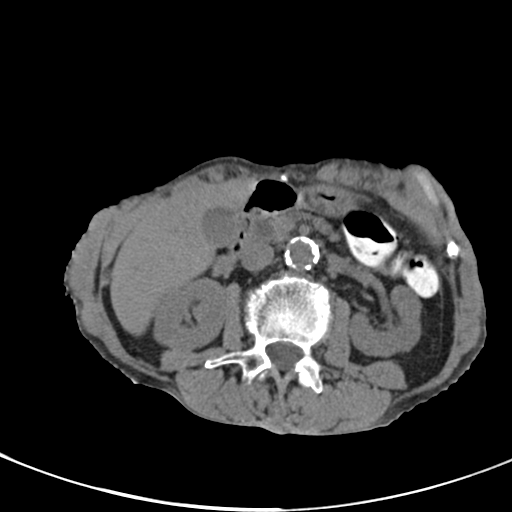
[im 45/68  soft-tissue]
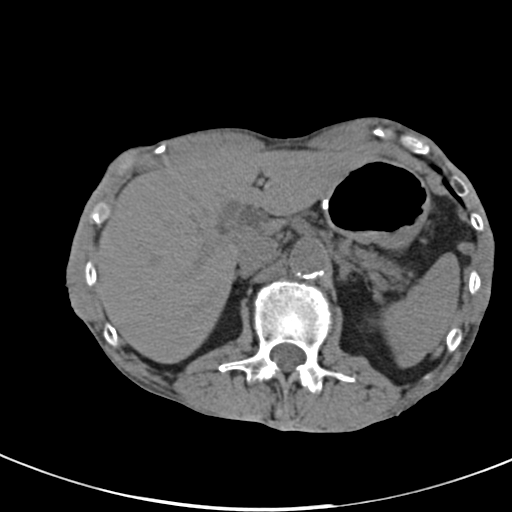

[Series 6: venous · axial · portal-venous · 0.59mm/px · z∈[-326,-182]mm · 6 of 68 slices shown, 11 images]
[im 10/68  soft-tissue]
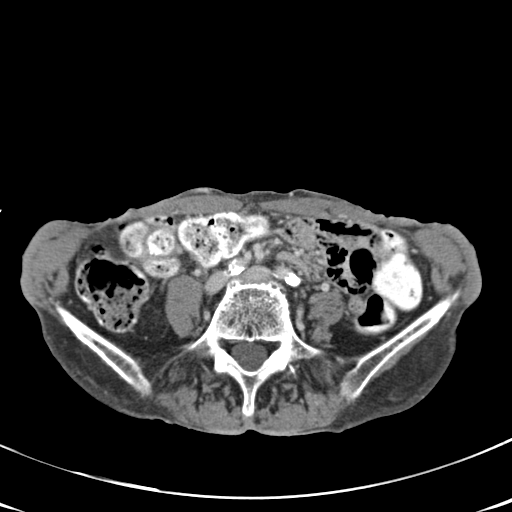
[im 10/68  bone]
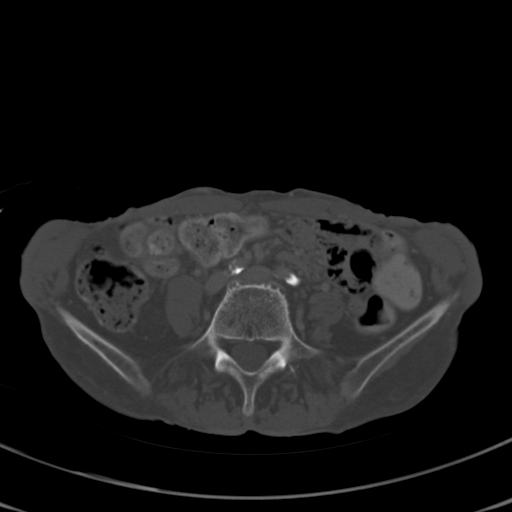
[im 20/68  soft-tissue]
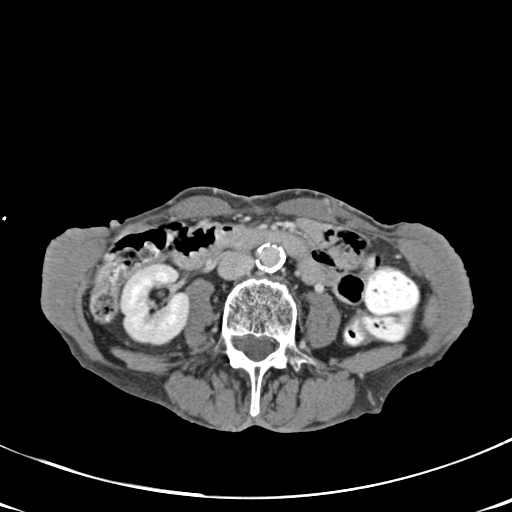
[im 29/68  soft-tissue]
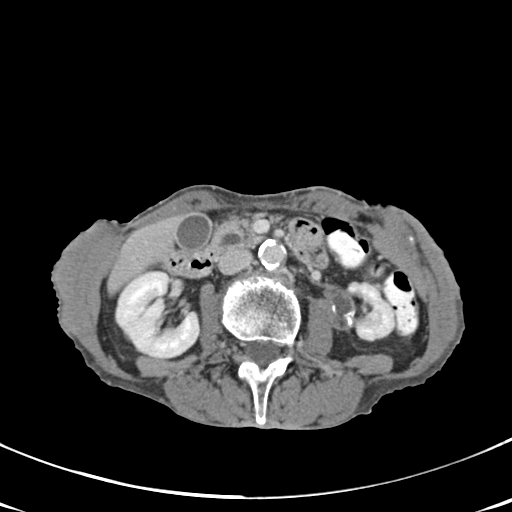
[im 29/68  lung]
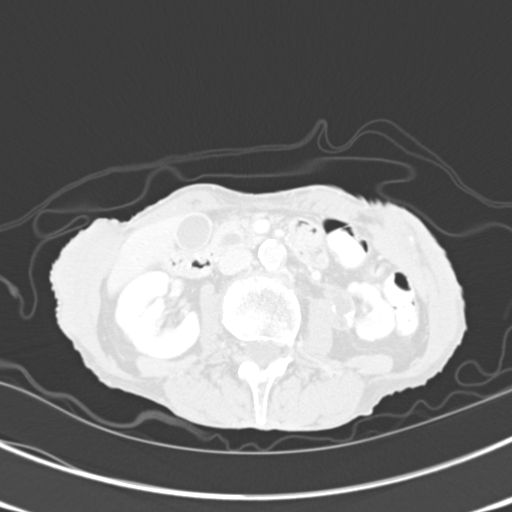
[im 39/68  soft-tissue]
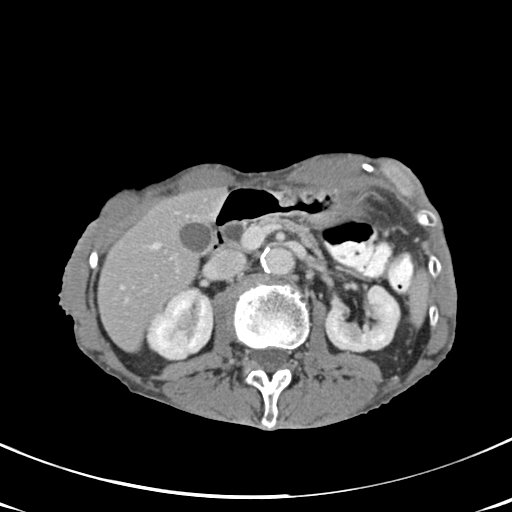
[im 39/68  lung]
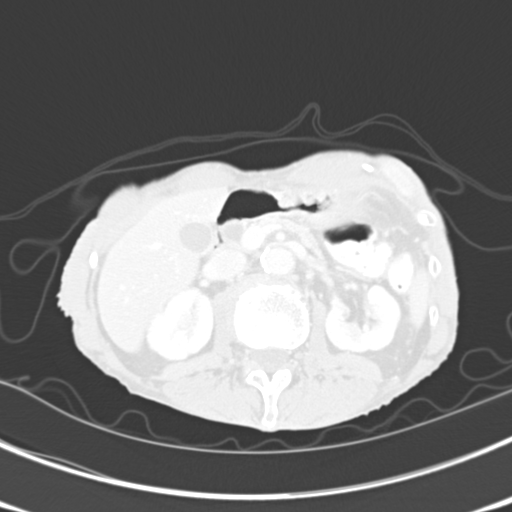
[im 48/68  soft-tissue]
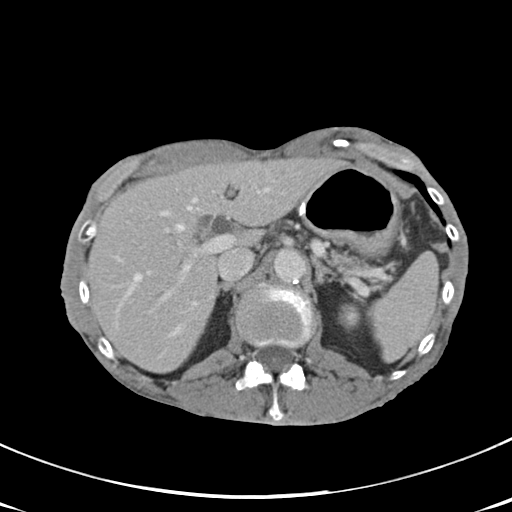
[im 48/68  lung]
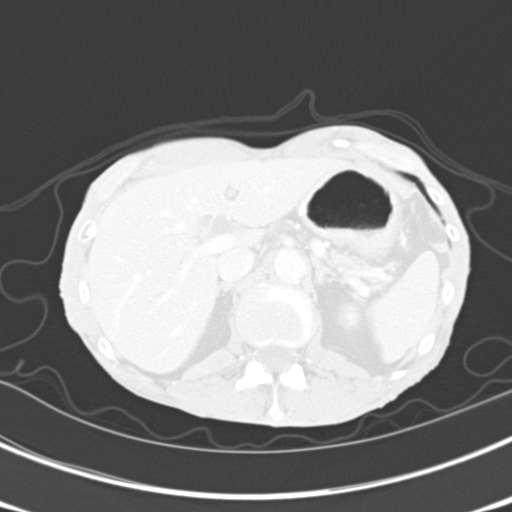
[im 58/68  soft-tissue]
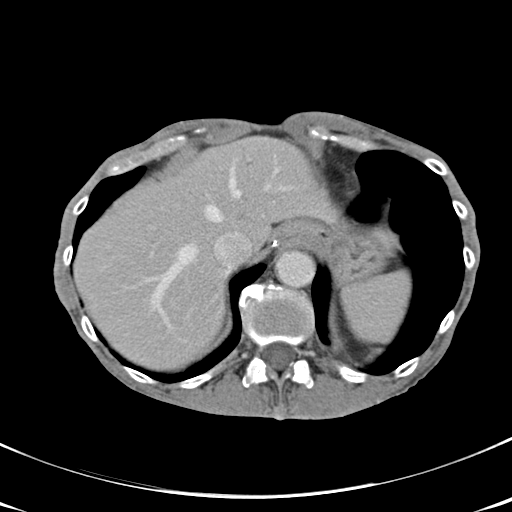
[im 58/68  lung]
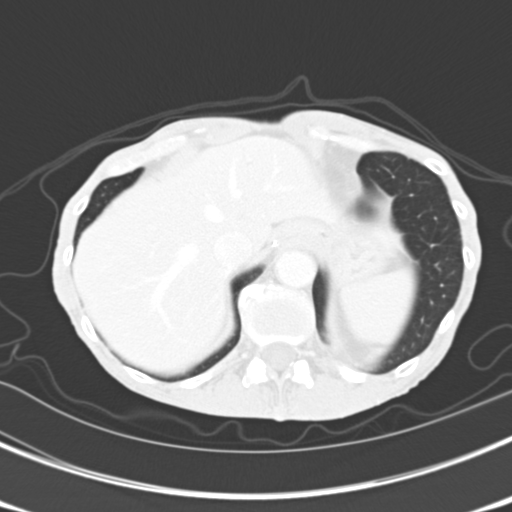

[Series 602: <mpr thick range> · coronal · 0.59mm/px · 2 of 90 slices shown, 3 images]
[im 30/90  soft-tissue]
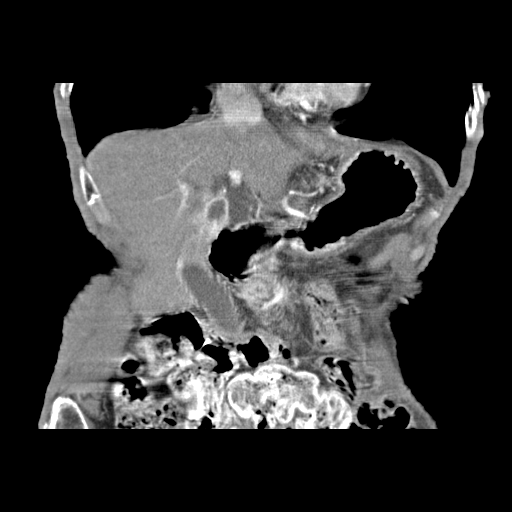
[im 30/90  bone]
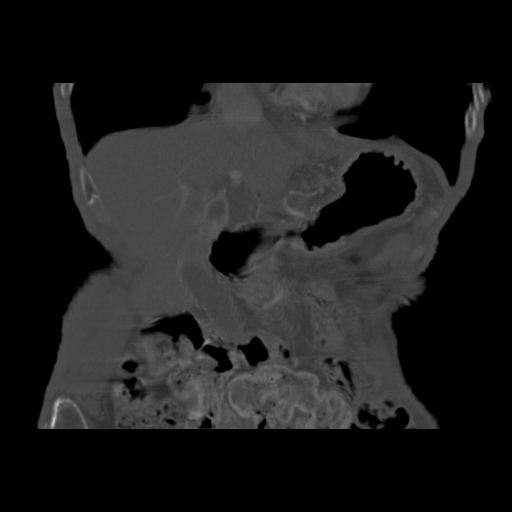
[im 60/90  soft-tissue]
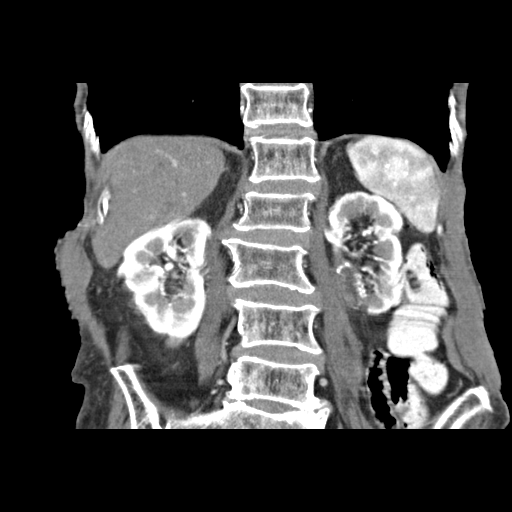

[12 of 46 positions shown; findings below may reference images not displayed]

FINDINGS: Lower chest: Lung bases are clear. Normal heart size. No pleural
effusion.

Hepatobiliary: The liver is normal in size and contour. No focal
lesion identified. On the arterial phase imaging there is
hyperenhancement about the central liver and gallbladder fossa,
likely transient perfusional anomaly as no definitive lesion is
identified on additional sequences. Mild central intrahepatic
biliary ductal dilatation, grossly stable when compared to prior.
The common bile duct is prominent measuring approximately 8 mm.

Pancreas: Unremarkable

Spleen: Unremarkable

Adrenals/Urinary Tract: The adrenal glands are normal. Kidneys
enhance symmetrically with contrast. Re- demonstrated cryoablation
defect along the medial aspect of the inferior pole of the left
kidney measuring 2.4 x 1.6 cm, previously 2.4 x 1.8 cm at the same
level (image 40; series 6). No significant postcontrast enhancement
is identified. The left renal artery and vein are patent.

Stomach/Bowel: No abnormal bowel wall thickening or evidence for
bowel obstruction. No free fluid or free intraperitoneal air.

Vascular/Lymphatic: Normal caliber abdominal aorta. Peripheral
calcified atherosclerotic plaque. No retroperitoneal
lymphadenopathy.

Other: None.

Musculoskeletal: Lumbar spine degenerative changes. No aggressive or
acute appearing osseous lesions.
IMPRESSION: No significant interval change in appearance of cryoablation site
along the medial inferior pole of the left kidney. No imaging
evidence to suggest localized recurrence.

Mild central intrahepatic biliary ductal dilatation with prominence
of the common bile duct measuring up to 8 mm. Consider correlation
with LFTs.

## 2017-11-18 ENCOUNTER — Other Ambulatory Visit: Payer: Self-pay | Admitting: Family Medicine

## 2017-11-18 ENCOUNTER — Telehealth: Payer: Self-pay

## 2017-11-18 ENCOUNTER — Other Ambulatory Visit: Payer: Self-pay

## 2017-11-18 DIAGNOSIS — R630 Anorexia: Secondary | ICD-10-CM

## 2017-11-18 MED ORDER — MEGESTROL ACETATE 20 MG PO TABS: 20.0000 mg | ORAL_TABLET | Freq: Every day | ORAL | 3 refills | Status: DC

## 2017-11-18 NOTE — Telephone Encounter (Signed)
Left a detailed vm for Megan with [facility]

## 2017-11-18 NOTE — Telephone Encounter (Signed)
Please inform patient that Rx for Megace was sent to pharmacy today. Order sent to [facility] to get new case of Ensure.

## 2017-11-18 NOTE — Telephone Encounter (Signed)
Patient is not eating and patient needs ensure through [facility] and something for her appetite.

## 2017-11-18 NOTE — Progress Notes (Unsigned)
Rx for Megace sent to pharmacy today.

## 2017-11-21 ENCOUNTER — Emergency Department (HOSPITAL_COMMUNITY): Payer: Medicaid Other

## 2017-11-21 ENCOUNTER — Inpatient Hospital Stay (HOSPITAL_COMMUNITY)
Admission: EM | Admit: 2017-11-21 | Discharge: 2017-11-23 | DRG: 054 | Disposition: A | Discharge status: Home / Self Care | Payer: Medicaid Other | Attending: Internal Medicine | Admitting: Internal Medicine

## 2017-11-21 ENCOUNTER — Encounter (HOSPITAL_COMMUNITY): Payer: Self-pay | Admitting: Emergency Medicine

## 2017-11-21 ENCOUNTER — Other Ambulatory Visit: Payer: Self-pay

## 2017-11-21 DIAGNOSIS — K8681 Exocrine pancreatic insufficiency: Secondary | ICD-10-CM | POA: Diagnosis present

## 2017-11-21 DIAGNOSIS — C7931 Secondary malignant neoplasm of brain: Secondary | ICD-10-CM | POA: Diagnosis present

## 2017-11-21 DIAGNOSIS — I10 Essential (primary) hypertension: Secondary | ICD-10-CM | POA: Diagnosis present

## 2017-11-21 DIAGNOSIS — Z79899 Other long term (current) drug therapy: Secondary | ICD-10-CM

## 2017-11-21 DIAGNOSIS — K861 Other chronic pancreatitis: Secondary | ICD-10-CM | POA: Diagnosis present

## 2017-11-21 DIAGNOSIS — Z923 Personal history of irradiation: Secondary | ICD-10-CM

## 2017-11-21 DIAGNOSIS — Z886 Allergy status to analgesic agent status: Secondary | ICD-10-CM

## 2017-11-21 DIAGNOSIS — D649 Anemia, unspecified: Secondary | ICD-10-CM | POA: Diagnosis not present

## 2017-11-21 DIAGNOSIS — Z888 Allergy status to other drugs, medicaments and biological substances status: Secondary | ICD-10-CM | POA: Diagnosis not present

## 2017-11-21 DIAGNOSIS — C7802 Secondary malignant neoplasm of left lung: Secondary | ICD-10-CM | POA: Diagnosis present

## 2017-11-21 DIAGNOSIS — R918 Other nonspecific abnormal finding of lung field: Secondary | ICD-10-CM

## 2017-11-21 DIAGNOSIS — Z681 Body mass index (BMI) 19 or less, adult: Secondary | ICD-10-CM

## 2017-11-21 DIAGNOSIS — Z85528 Personal history of other malignant neoplasm of kidney: Secondary | ICD-10-CM

## 2017-11-21 DIAGNOSIS — C801 Malignant (primary) neoplasm, unspecified: Secondary | ICD-10-CM | POA: Diagnosis not present

## 2017-11-21 DIAGNOSIS — Z7951 Long term (current) use of inhaled steroids: Secondary | ICD-10-CM

## 2017-11-21 DIAGNOSIS — G8191 Hemiplegia, unspecified affecting right dominant side: Secondary | ICD-10-CM | POA: Diagnosis present

## 2017-11-21 DIAGNOSIS — Z79818 Long term (current) use of other agents affecting estrogen receptors and estrogen levels: Secondary | ICD-10-CM | POA: Diagnosis not present

## 2017-11-21 DIAGNOSIS — Z801 Family history of malignant neoplasm of trachea, bronchus and lung: Secondary | ICD-10-CM | POA: Diagnosis not present

## 2017-11-21 DIAGNOSIS — F17210 Nicotine dependence, cigarettes, uncomplicated: Secondary | ICD-10-CM | POA: Diagnosis present

## 2017-11-21 DIAGNOSIS — Z79891 Long term (current) use of opiate analgesic: Secondary | ICD-10-CM

## 2017-11-21 DIAGNOSIS — K859 Acute pancreatitis without necrosis or infection, unspecified: Secondary | ICD-10-CM | POA: Diagnosis not present

## 2017-11-21 DIAGNOSIS — K860 Alcohol-induced chronic pancreatitis: Secondary | ICD-10-CM

## 2017-11-21 DIAGNOSIS — F411 Generalized anxiety disorder: Secondary | ICD-10-CM | POA: Diagnosis present

## 2017-11-21 DIAGNOSIS — G894 Chronic pain syndrome: Secondary | ICD-10-CM | POA: Diagnosis not present

## 2017-11-21 DIAGNOSIS — J449 Chronic obstructive pulmonary disease, unspecified: Secondary | ICD-10-CM | POA: Diagnosis present

## 2017-11-21 DIAGNOSIS — E43 Unspecified severe protein-calorie malnutrition: Secondary | ICD-10-CM | POA: Diagnosis present

## 2017-11-21 DIAGNOSIS — F1120 Opioid dependence, uncomplicated: Secondary | ICD-10-CM | POA: Diagnosis present

## 2017-11-21 LAB — I-STAT CHEM 8, ED
BUN: 22 mg/dL (ref 8–23)
CALCIUM ION: 1.22 mmol/L (ref 1.15–1.40)
CHLORIDE: 103 mmol/L (ref 98–111)
CREATININE: 0.9 mg/dL (ref 0.44–1.00)
GLUCOSE: 94 mg/dL (ref 70–99)
HCT: 39 % (ref 36.0–46.0)
Hemoglobin: 13.3 g/dL (ref 12.0–15.0)
Potassium: 4.2 mmol/L (ref 3.5–5.1)
Sodium: 136 mmol/L (ref 135–145)
TCO2: 26 mmol/L (ref 22–32)

## 2017-11-21 LAB — CBC WITH DIFFERENTIAL/PLATELET
Abs Immature Granulocytes: 0.02 10*3/uL (ref 0.00–0.07)
Basophils Absolute: 0 10*3/uL (ref 0.0–0.1)
Basophils Relative: 1 %
EOS ABS: 0.2 10*3/uL (ref 0.0–0.5)
Eosinophils Relative: 3 %
HCT: 39.8 % (ref 36.0–46.0)
Hemoglobin: 12.5 g/dL (ref 12.0–15.0)
IMMATURE GRANULOCYTES: 0 %
Lymphocytes Relative: 13 %
Lymphs Abs: 0.7 10*3/uL (ref 0.7–4.0)
MCH: 27.4 pg (ref 26.0–34.0)
MCHC: 31.4 g/dL (ref 30.0–36.0)
MCV: 87.3 fL (ref 80.0–100.0)
MONO ABS: 0.5 10*3/uL (ref 0.1–1.0)
Monocytes Relative: 10 %
NEUTROS ABS: 3.8 10*3/uL (ref 1.7–7.7)
NRBC: 0 % (ref 0.0–0.2)
Neutrophils Relative %: 73 %
PLATELETS: 238 10*3/uL (ref 150–400)
RBC: 4.56 MIL/uL (ref 3.87–5.11)
RDW: 15.7 % — AB (ref 11.5–15.5)
WBC: 5.2 10*3/uL (ref 4.0–10.5)

## 2017-11-21 LAB — COMPREHENSIVE METABOLIC PANEL
ALT: 12 U/L (ref 0–44)
AST: 19 U/L (ref 15–41)
Albumin: 4.1 g/dL (ref 3.5–5.0)
Alkaline Phosphatase: 73 U/L (ref 38–126)
Anion gap: 10 (ref 5–15)
BILIRUBIN TOTAL: 0.3 mg/dL (ref 0.3–1.2)
BUN: 21 mg/dL (ref 8–23)
CHLORIDE: 101 mmol/L (ref 98–111)
CO2: 24 mmol/L (ref 22–32)
Calcium: 9.8 mg/dL (ref 8.9–10.3)
Creatinine, Ser: 0.92 mg/dL (ref 0.44–1.00)
Glucose, Bld: 99 mg/dL (ref 70–99)
POTASSIUM: 4.2 mmol/L (ref 3.5–5.1)
Sodium: 135 mmol/L (ref 135–145)
TOTAL PROTEIN: 7.4 g/dL (ref 6.5–8.1)

## 2017-11-21 LAB — CK: CK TOTAL: 39 U/L (ref 38–234)

## 2017-11-21 LAB — I-STAT TROPONIN, ED: Troponin i, poc: 0.01 ng/mL (ref 0.00–0.08)

## 2017-11-21 LAB — LIPASE, BLOOD: LIPASE: 18 U/L (ref 11–51)

## 2017-11-21 LAB — MAGNESIUM: MAGNESIUM: 2.2 mg/dL (ref 1.7–2.4)

## 2017-11-21 LAB — APTT: aPTT: 31 seconds (ref 24–36)

## 2017-11-21 LAB — PROTIME-INR
INR: 0.94
Prothrombin Time: 12.5 seconds (ref 11.4–15.2)

## 2017-11-21 LAB — ETHANOL: Alcohol, Ethyl (B): <10 mg/dL (ref <10)

## 2017-11-21 MED ORDER — POLYETHYLENE GLYCOL 3350 17 G PO PACK: 17.0000 g | PACK | Freq: Every day | ORAL | Status: DC | PRN

## 2017-11-21 MED ORDER — FENTANYL CITRATE (PF) 100 MCG/2ML IJ SOLN
50.0000 ug | Freq: Once | INTRAMUSCULAR | Status: AC
  Administered 2017-11-21: 50 ug via INTRAVENOUS
  Filled 2017-11-21: qty 2

## 2017-11-21 MED ORDER — IPRATROPIUM-ALBUTEROL 0.5-2.5 (3) MG/3ML IN SOLN: 3.0000 mL | Freq: Four times a day (QID) | RESPIRATORY_TRACT | Status: DC | PRN

## 2017-11-21 MED ORDER — DEXAMETHASONE SODIUM PHOSPHATE 10 MG/ML IJ SOLN
10.0000 mg | Freq: Once | INTRAMUSCULAR | Status: AC
  Administered 2017-11-21: 10 mg via INTRAVENOUS
  Filled 2017-11-21: qty 1

## 2017-11-21 MED ORDER — ACETAMINOPHEN 650 MG RE SUPP: 650.0000 mg | Freq: Four times a day (QID) | RECTAL | Status: DC | PRN

## 2017-11-21 MED ORDER — GADOBUTROL 1 MMOL/ML IV SOLN: 3.5000 mL | Freq: Once | INTRAVENOUS | Status: DC | PRN

## 2017-11-21 MED ORDER — UMECLIDINIUM BROMIDE 62.5 MCG/INH IN AEPB
1.0000 | INHALATION_SPRAY | Freq: Every day | RESPIRATORY_TRACT | Status: DC
  Administered 2017-11-21: 1 via RESPIRATORY_TRACT
  Filled 2017-11-21: qty 7

## 2017-11-21 MED ORDER — MEGESTROL ACETATE 20 MG PO TABS
20.0000 mg | ORAL_TABLET | Freq: Every day | ORAL | Status: DC
  Administered 2017-11-21 – 2017-11-23 (×3): 20 mg via ORAL
  Filled 2017-11-21 (×3): qty 1

## 2017-11-21 MED ORDER — ONDANSETRON HCL 4 MG/2ML IJ SOLN
4.0000 mg | Freq: Four times a day (QID) | INTRAMUSCULAR | Status: DC | PRN
  Administered 2017-11-22 – 2017-11-23 (×2): 4 mg via INTRAVENOUS
  Filled 2017-11-21 (×2): qty 2

## 2017-11-21 MED ORDER — LORAZEPAM 2 MG/ML IJ SOLN
1.0000 mg | Freq: Once | INTRAMUSCULAR | Status: AC
  Administered 2017-11-21: 1 mg via INTRAVENOUS
  Filled 2017-11-21: qty 1

## 2017-11-21 MED ORDER — SODIUM CHLORIDE 0.9 % IV BOLUS
500.0000 mL | Freq: Once | INTRAVENOUS | Status: AC
  Administered 2017-11-21: 500 mL via INTRAVENOUS

## 2017-11-21 MED ORDER — TIOTROPIUM BROMIDE MONOHYDRATE 18 MCG IN CAPS: 18.0000 ug | ORAL_CAPSULE | Freq: Every day | RESPIRATORY_TRACT | Status: DC

## 2017-11-21 MED ORDER — OXYCODONE-ACETAMINOPHEN 5-325 MG PO TABS
1.0000 | ORAL_TABLET | Freq: Four times a day (QID) | ORAL | Status: DC | PRN
  Administered 2017-11-21 – 2017-11-23 (×7): 2 via ORAL
  Filled 2017-11-21 (×8): qty 2

## 2017-11-21 MED ORDER — MOMETASONE FURO-FORMOTEROL FUM 200-5 MCG/ACT IN AERO
2.0000 | INHALATION_SPRAY | Freq: Two times a day (BID) | RESPIRATORY_TRACT | Status: DC
  Administered 2017-11-21: 2 via RESPIRATORY_TRACT
  Filled 2017-11-21: qty 8.8

## 2017-11-21 MED ORDER — IOHEXOL 300 MG/ML  SOLN
30.0000 mL | Freq: Once | INTRAMUSCULAR | Status: AC | PRN
  Administered 2017-11-21: 30 mL via ORAL

## 2017-11-21 MED ORDER — LORAZEPAM 1 MG PO TABS
1.0000 mg | ORAL_TABLET | Freq: Three times a day (TID) | ORAL | Status: DC | PRN
  Administered 2017-11-21 – 2017-11-23 (×6): 1 mg via ORAL
  Filled 2017-11-21 (×6): qty 1

## 2017-11-21 MED ORDER — BUPRENORPHINE HCL 2 MG SL SUBL: 8.0000 mg | SUBLINGUAL_TABLET | Freq: Every day | SUBLINGUAL | Status: DC

## 2017-11-21 MED ORDER — MIRTAZAPINE 15 MG PO TABS
15.0000 mg | ORAL_TABLET | Freq: Every day | ORAL | Status: DC
  Administered 2017-11-22 – 2017-11-23 (×2): 15 mg via ORAL
  Filled 2017-11-21: qty 1
  Filled 2017-11-21: qty 0.5
  Filled 2017-11-21: qty 1

## 2017-11-21 MED ORDER — SODIUM CHLORIDE 0.9 % IV SOLN
INTRAVENOUS | Status: DC
  Administered 2017-11-21 (×2): via INTRAVENOUS

## 2017-11-21 MED ORDER — ENOXAPARIN SODIUM 30 MG/0.3ML ~~LOC~~ SOLN
30.0000 mg | SUBCUTANEOUS | Status: DC
  Administered 2017-11-21 – 2017-11-22 (×2): 30 mg via SUBCUTANEOUS
  Filled 2017-11-21 (×2): qty 0.3

## 2017-11-21 MED ORDER — ACETAMINOPHEN 325 MG PO TABS
650.0000 mg | ORAL_TABLET | Freq: Four times a day (QID) | ORAL | Status: DC | PRN
  Administered 2017-11-22: 325 mg via ORAL
  Filled 2017-11-21: qty 2

## 2017-11-21 MED ORDER — BUPRENORPHINE HCL-NALOXONE HCL 8-2 MG SL SUBL
1.0000 | SUBLINGUAL_TABLET | Freq: Every day | SUBLINGUAL | Status: DC
  Administered 2017-11-22 – 2017-11-23 (×2): 1 via SUBLINGUAL
  Filled 2017-11-21 (×3): qty 1

## 2017-11-21 MED ORDER — ONDANSETRON HCL 4 MG PO TABS
4.0000 mg | ORAL_TABLET | Freq: Four times a day (QID) | ORAL | Status: DC | PRN
  Administered 2017-11-21: 4 mg via ORAL
  Filled 2017-11-21: qty 1

## 2017-11-21 MED ORDER — FAMOTIDINE 20 MG PO TABS: 20.0000 mg | ORAL_TABLET | Freq: Every day | ORAL | Status: DC | PRN

## 2017-11-21 MED ORDER — ENSURE ENLIVE PO LIQD: 237.0000 mL | Freq: Three times a day (TID) | ORAL | Status: DC

## 2017-11-21 MED ORDER — DEXAMETHASONE SODIUM PHOSPHATE 10 MG/ML IJ SOLN
10.0000 mg | Freq: Four times a day (QID) | INTRAMUSCULAR | Status: DC
  Filled 2017-11-21 (×2): qty 1

## 2017-11-21 NOTE — ED Provider Notes (Signed)
[facility] DEPT Provider Note   CSN: [id number] Arrival date & time: 11/21/17  0725     History   Chief Complaint Chief Complaint  Patient presents with  . Weakness  . Abdominal Pain    HPI [name] is a 67 y.o. female.  HPI  67 year old female presents with a chief complaint of weakness.  She states that starting around 4 days ago or so she developed left arm and left leg weakness.  There is a small amount of right leg weakness but it is predominantly left-sided.  No right arm symptoms.  She feels like her speech might be a little bit slurred as well.  1 week ago she was seen here for abdominal pain which she states is a recurrent issue for her.  She states that it is still present.  No vomiting.  Patient states sometime after that ED visit she develops these weakness symptoms.  There is no headache or blurry vision.  She does feel like her speech might be a little slurred.  She is also having some pain and numbness in her thighs.  She feels very anxious right now.  She has felt a little bit of posterior neck pain that she states started after all the weakness symptoms.  Past Medical History:  Diagnosis Date  . Cancer ([place])    renal ca  . COPD (chronic obstructive pulmonary disease) ([place])   . Drug-seeking behavior   . Pancreatitis   . Pancreatitis   . Seizures ([place])   . Substance abuse [facility])     Patient Active Problem List   Diagnosis Date Noted  . Right hemiplegia ([place]) 11/21/2017  . 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 Lung mass 10/04/2017  . Osteoarthritis of facet joint of lumbar spine 07/26/2017  . Low back pain radiating to both legs 07/26/2017  . 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 Failure to thrive in adult 06/21/2017  . Pressure injury of skin 04/06/2017  . Pressure injury of skin of right ischial tuberosity region: Stage 2 04/06/2017  .  Benzodiazepine dependence ([place])   . Narcotic addiction ([place]) 02/29/2016  . Abdominal pain, chronic, epigastric- due to chronic pancreatitis 02/29/2016  . COPD (chronic obstructive pulmonary disease) ([place]) 02/28/2016  . Cough 09/03/2015  . Abdominal pain 01/03/2015  . Hyponatremia 01/03/2015  . Hypocalcemia 01/03/2015  . Underweight 01/03/2015  . COPD GOLD III with reversible component  07/17/2014  . Chronic alcoholic pancreatitis ([place]) 06/02/2013  . Recurrent acute pancreatitis 03/28/2013  . Alcohol withdrawal ([place]) 03/28/2013  . Pancreatitis 03/09/2013  . Tobacco abuse 03/09/2013  . Alcohol withdrawal syndrome with perceptual disturbance ([place]) 03/09/2013  . Benzodiazepine withdrawal ([place]) 02/09/2013  . Protein-calorie malnutrition, severe ([place]) 02/09/2013  . Hypokalemia 02/27/2011  . Abdominal pain, acute 02/25/2011  . Nausea and vomiting 02/25/2011  . Thrombocytopenia ([place]) 02/25/2011  . HTN (hypertension), malignant 02/25/2011  . Malignant neoplasm of kidney excluding renal pelvis ([place]) 10/18/2008  . Anxiety state 10/18/2008  . GERD 10/18/2008  . PANCREATITIS, CHRONIC- atrophic pancreas 10/18/2008  . DYSPNEA 10/18/2008  . GASTRIC ULCER, HX OF 10/18/2008    Past Surgical History:  Procedure Laterality Date  . HIP SURGERY Bilateral   . IR GENERIC HISTORICAL  05/08/2015   IR RADIOLOGIST EVAL & MGMT 05/08/2015 Aletta Edouard, MD [facility] INTERV RAD  . IR GENERIC HISTORICAL  01/08/2014   IR RADIOLOGIST EVAL & MGMT 01/08/2014 Aletta Edouard, MD [facility] INTERV RAD  .  IR RADIOLOGY PERIPHERAL GUIDED IV START  10/03/2017  . IR RADIOLOGY PERIPHERAL GUIDED IV START  10/10/2017  . IR US GUIDE VASC ACCESS RIGHT  10/05/2017  . IR US GUIDE VASC ACCESS RIGHT  10/10/2017  . KIDNEY SURGERY     removed cancerous lesions  . PARTIAL GASTRECTOMY       OB History   None      Home Medications    Prior to Admission medications   Medication Sig Start Date End Date Taking? Authorizing Provider    DULERA 200-5 MCG/ACT AERO Inhale 2 puffs into the lungs 2 (two) times daily. 11/08/17  Yes [provider]  famotidine (PEPCID) 20 MG tablet Take 1 tablet (20 mg total) by mouth daily. Patient taking differently: Take 20 mg by mouth daily as needed for heartburn or indigestion.  08/28/17  Yes Khatri, Hina, PA-C  feeding supplement, ENSURE ENLIVE, (ENSURE ENLIVE) LIQD Take 237 mLs by mouth 3 (three) times daily between meals. 10/25/17  Yes Eugenie Filler, MD  ipratropium-albuterol (DUONEB) 0.5-2.5 (3) MG/3ML SOLN Take 3 mLs by nebulization every 6 (six) hours as needed. Use 3 times daily x 4 days, then every 6 hours as needed. 10/25/17  Yes Eugenie Filler, MD  LORazepam (ATIVAN) 1 MG tablet Take 1 tablet (1 mg total) by mouth every 8 (eight) hours as needed for anxiety. 10/25/17  Yes Eugenie Filler, MD  losartan (COZAAR) 50 MG tablet Take 50 mg by mouth daily.   Yes [provider]  megestrol (MEGACE) 20 MG tablet Take 1 tablet (20 mg total) by mouth daily. 11/18/17  Yes Azzie Glatter, FNP  mirtazapine (REMERON) 15 MG tablet Take 15 mg by mouth daily. 11/03/17  Yes [provider]  ondansetron (ZOFRAN) 4 MG tablet Take 1 tablet (4 mg total) by mouth daily as needed. 11/09/17  Yes Azzie Glatter, FNP  oxyCODONE-acetaminophen (PERCOCET) 5-325 MG tablet Take 1-2 tablets by mouth every 6 (six) hours as needed. 11/14/17  Yes Delo, Nathaneil Canary, MD  PROAIR HFA 108 [phone] Base) MCG/ACT inhaler Inhale 1 puff into the lungs every 4 (four) hours as needed for wheezing or shortness of breath. 11/01/17  Yes Azzie Glatter, FNP  SUBOXONE 8-2 MG FILM Take 8 mg by mouth daily. 10/30/17  Yes [provider]  tiotropium (SPIRIVA HANDIHALER) 18 MCG inhalation capsule PLACE 1 CAPSULE INTO INHALER AND INHALE DAILY Patient taking differently: Place 18 mcg into inhaler and inhale daily.  10/29/17  Yes Eugenie Filler, MD    Family History Family History  Problem  Relation Age of Onset  . CAD Mother   . Alcoholism Father   . COPD Father   . Lung cancer Father   . Rectal cancer Maternal Uncle     Social History Social History   Tobacco Use  . Smoking status: Current Every Day Smoker    Packs/day: 0.50    Years: 30.00    Pack years: 15.00    Types: Cigarettes  . Smokeless tobacco: Never Used  . Tobacco comment: smoking up to 1.5 ppd per husband. down from 3pk day to 1.5   Substance Use Topics  . Alcohol use: Not Currently    Alcohol/week: 0.0 standard drinks    Comment: "I stopped drinking 1 year ago"  . Drug use: No    Comment: Hx of polysubstance drug abuse     Allergies   Aspirin and Chlorpromazine hcl   Review of Systems Review of Systems  Constitutional:  Negative for fever.  Eyes: Negative for visual disturbance.  Cardiovascular: Negative for chest pain.  Gastrointestinal: Positive for abdominal pain. Negative for vomiting.  Musculoskeletal: Positive for neck pain.  Neurological: Positive for weakness and numbness. Negative for headaches.  Psychiatric/Behavioral: The patient is nervous/anxious.   All other systems reviewed and are negative.    Physical Exam Updated Vital Signs BP 118/89   Pulse 93   Temp 97.8 F (36.6 C) (Oral)   Resp 15   SpO2 95%   Physical Exam  Constitutional: She is oriented to person, place, and time. She appears well-developed. She appears cachectic.  Non-toxic appearance. She does not appear ill.  HENT:  Head: Normocephalic and atraumatic.  Right Ear: External ear normal.  Left Ear: External ear normal.  Nose: Nose normal.  Eyes: Right eye exhibits no discharge. Left eye exhibits no discharge.  Cardiovascular: Normal rate, regular rhythm and normal heart sounds.  Pulses:      Radial pulses are 1+ on the right side, and 1+ on the left side.       Dorsalis pedis pulses are 1+ on the right side, and 1+ on the left side.  Pulmonary/Chest: Effort normal and breath sounds normal.    Abdominal: Soft. She exhibits no distension. There is generalized tenderness.  Neurological: She is alert and oriented to person, place, and time.  CN 3-12 grossly intact. 5/5 strength in BUE. 4/5 strength in BLE, could be pain related but difficult to tell. Grossly normal sensation.    Skin: Skin is warm and dry.  Psychiatric: Her mood appears anxious.  Nursing note and vitals reviewed.    ED Treatments / Results  Labs (all labs ordered are listed, but only abnormal results are displayed) Labs Reviewed  CBC WITH DIFFERENTIAL/PLATELET - Abnormal; Notable for the following components:      Result Value   RDW 15.7 (*)    All other components within normal limits  PROTIME-INR  APTT  COMPREHENSIVE METABOLIC PANEL  MAGNESIUM  LIPASE, BLOOD  CK  ETHANOL  RAPID URINE DRUG SCREEN, HOSP PERFORMED  URINALYSIS, ROUTINE W REFLEX MICROSCOPIC  I-STAT CHEM 8, ED  I-STAT TROPONIN, ED  CBG MONITORING, ED    EKG EKG Interpretation  Date/Time:  Monday November 21 2017 08:17:11 EST Ventricular Rate:  94 PR Interval:    QRS Duration: 67 QT Interval:  338 QTC Calculation: 423 R Axis:   85 Text Interpretation:  Sinus rhythm Atrial premature complex Right atrial enlargement Borderline right axis deviation Consider left ventricular hypertrophy Abnormal T, consider ischemia, lateral leads Confirmed by [facility], : ([id number]), editor [place], Deirdre [phone]) on 11/21/2017 10:43:32 AM   Radiology Ct Abdomen Pelvis Wo Contrast  Result Date: 11/21/2017 CLINICAL DATA:  Abdominal pain.  Patient refused IV contrast. EXAM: CT ABDOMEN AND PELVIS WITHOUT CONTRAST TECHNIQUE: Multidetector CT imaging of the abdomen and pelvis was performed following the standard protocol without IV contrast. COMPARISON:  08/30/2017, 10/10/2017 FINDINGS: Lower chest: Mild bibasilar scarring. Hepatobiliary: No focal liver abnormality is seen. No gallstones, gallbladder wall thickening. Persistent stable dilatation of the  common bile duct unchanged compared with multiple prior exams. Pancreas: Unremarkable. No pancreatic ductal dilatation or surrounding inflammatory changes. Spleen: Normal in size without focal abnormality. Adrenals/Urinary Tract: Adrenal glands are unremarkable. No urolithiasis or obstructive uropathy. Partially calcified 13 mm left lower pole renal mass consistent with posttreatment changes from prior ablation. Bladder is unremarkable. Stomach/Bowel: Stomach is within normal limits. No evidence of bowel wall thickening, distention, or inflammatory changes.  Vascular/Lymphatic: Abdominal aortic atherosclerosis. Normal caliber abdominal aorta. No lymphadenopathy. Reproductive: Status post hysterectomy. No adnexal masses. Other: No abdominal wall hernia or abnormality. No abdominopelvic ascites. No pneumatosis, pneumoperitoneum or portal venous gas. Musculoskeletal: Degenerative disc disease with disc height loss at L2-3 and L5-S1. Minimal retrolisthesis of L2 on L3. Broad-based disc bulges at L2-3, L3-4, L4-5 and L5-S1. Bilateral facet arthropathy at L4-5 and L5-S1. Left foraminal stenosis at L5-S1. IMPRESSION: 1. No acute abdominal or pelvic pathology. 2.  Aortic Atherosclerosis (ICD10-I70.0). 3. Lumbar spine spondylosis. Electronically Signed   By: Kathreen Devoid   On: 11/21/2017 12:42   Dg Chest 2 View  Result Date: 11/21/2017 CLINICAL DATA:  Left-sided weakness and shortness of breath EXAM: CHEST - 2 VIEW COMPARISON:  November 14, 2017 chest radiograph and chest CT October 23, 2017 FINDINGS: Previously noted mass in the medial left lower lobe region is again noted, measuring 3.4 x 2.7 x 3.0 cm. There is no edema or consolidation. The heart size and pulmonary vascular normal. There is aortic atherosclerosis. No evident bone lesions. IMPRESSION: Persistent mass posteromedial left lower lobe region. No new opacity evident. No edema or consolidation. Stable cardiac silhouette. There is aortic atherosclerosis. No  evident adenopathy. Aortic Atherosclerosis (ICD10-I70.0). Electronically Signed   By: Lowella Grip III M.D.   On: 11/21/2017 08:19   Ct Head Wo Contrast  Result Date: 11/21/2017 CLINICAL DATA:  67 year old female with LEFT arm and LEFT leg weakness for 4 days. History of lung cancer. EXAM: CT HEAD WITHOUT CONTRAST CT CERVICAL SPINE WITHOUT CONTRAST TECHNIQUE: Multidetector CT imaging of the head and cervical spine was performed following the standard protocol without intravenous contrast. Multiplanar CT image reconstructions of the cervical spine were also generated. COMPARISON:  10/03/2017 PET CT FINDINGS: CT HEAD FINDINGS Brain: A 1 cm x 2.5 cm RIGHT frontoparietal hyperdensity/mass with moderate adjacent vasogenic edema is noted likely representing malignancy/metastasis. There is no evidence of acute infarct, definite hemorrhage, extra-axial collection, midline shift or hydrocephalus. Atrophy and mild chronic small-vessel white matter ischemic changes are noted. Vascular: Atherosclerotic calcifications noted. Skull: Normal. Negative for fracture or focal lesion. Sinuses/Orbits: No acute finding. Other: None. CT CERVICAL SPINE FINDINGS Alignment: Normal. Skull base and vertebrae: No acute fracture. No definite bone lesion or focal pathologic process. Soft tissues and spinal canal: No prevertebral fluid or swelling. No visible canal hematoma. Disc levels: Moderate multilevel degenerative disc disease/spondylosis again noted. Upper chest: A 6 mm RIGHT apical ground-glass nodule is unchanged. No acute abnormality. Other: None IMPRESSION: 1. 1 x 2.5 cm RIGHT frontoparietal hyperdensity/mass with adjacent vasogenic edema highly suspicious for malignancy/metastasis. No midline shift, hydrocephalus or gross hemorrhage. MRI of the brain with and without contrast is recommended for further evaluation. 2. Atrophy and chronic small-vessel white matter ischemic changes. 3. Moderate multilevel degenerative changes  within the cervical spine without acute abnormality. 4. Unchanged 6 mm RIGHT apical ground-glass nodule. Electronically Signed   By: Margarette Canada M.D.   On: 11/21/2017 10:08   Ct Cervical Spine Wo Contrast  Result Date: 11/21/2017 CLINICAL DATA:  67 year old female with LEFT arm and LEFT leg weakness for 4 days. History of lung cancer. EXAM: CT HEAD WITHOUT CONTRAST CT CERVICAL SPINE WITHOUT CONTRAST TECHNIQUE: Multidetector CT imaging of the head and cervical spine was performed following the standard protocol without intravenous contrast. Multiplanar CT image reconstructions of the cervical spine were also generated. COMPARISON:  10/03/2017 PET CT FINDINGS: CT HEAD FINDINGS Brain: A 1 cm x 2.5 cm RIGHT frontoparietal hyperdensity/mass with  moderate adjacent vasogenic edema is noted likely representing malignancy/metastasis. There is no evidence of acute infarct, definite hemorrhage, extra-axial collection, midline shift or hydrocephalus. Atrophy and mild chronic small-vessel white matter ischemic changes are noted. Vascular: Atherosclerotic calcifications noted. Skull: Normal. Negative for fracture or focal lesion. Sinuses/Orbits: No acute finding. Other: None. CT CERVICAL SPINE FINDINGS Alignment: Normal. Skull base and vertebrae: No acute fracture. No definite bone lesion or focal pathologic process. Soft tissues and spinal canal: No prevertebral fluid or swelling. No visible canal hematoma. Disc levels: Moderate multilevel degenerative disc disease/spondylosis again noted. Upper chest: A 6 mm RIGHT apical ground-glass nodule is unchanged. No acute abnormality. Other: None IMPRESSION: 1. 1 x 2.5 cm RIGHT frontoparietal hyperdensity/mass with adjacent vasogenic edema highly suspicious for malignancy/metastasis. No midline shift, hydrocephalus or gross hemorrhage. MRI of the brain with and without contrast is recommended for further evaluation. 2. Atrophy and chronic small-vessel white matter ischemic changes.  3. Moderate multilevel degenerative changes within the cervical spine without acute abnormality. 4. Unchanged 6 mm RIGHT apical ground-glass nodule. Electronically Signed   By: Margarette Canada M.D.   On: 11/21/2017 10:08   Mr Brain Wo Contrast  Result Date: 11/21/2017 CLINICAL DATA:  Left arm and leg weakness, 4 days duration. Left lower lobe lung mass. History of renal cell cancer. EXAM: MRI HEAD WITHOUT CONTRAST TECHNIQUE: Multiplanar, multiecho pulse sequences of the brain and surrounding structures were obtained without intravenous contrast. COMPARISON:  CT 11/21/2017 FINDINGS: Brain: Study suffers from motion degradation. The patient would not allow contrast administration. There is a 12 mm mass at the right frontoparietal vertex with surrounding vasogenic edema. Given the clinical history, this most likely represents a metastasis. No second lesion is identified, though sensitivity is diminished in the absence of contrast. If the patient can be convinced, contrast-enhanced imaging would be useful. Elsewhere, there are mild chronic small-vessel changes of the white matter. No hydrocephalus or extra-axial collection. No hemorrhage. Vascular: Major vessels at the base of the brain show flow. Skull and upper cervical spine: Negative Sinuses/Orbits: Clear/normal Other: None IMPRESSION: 12 mm mass at the right frontoparietal vertex with pronounced regional vasogenic edema. This is most likely a metastasis. No second lesion is identified. However, the patient would not allow contrast administration, and tiny other lesions are not excluded at this point. Electronically Signed   By: Nelson Chimes M.D.   On: 11/21/2017 11:43    Procedures Procedures (including critical care time)  Medications Ordered in ED Medications  gadobutrol (GADAVIST) 1 MMOL/ML injection 3.5 mL (has no administration in time range)  feeding supplement (ENSURE ENLIVE) (ENSURE ENLIVE) liquid 237 mL (has no administration in time range)    ipratropium-albuterol (DUONEB) 0.5-2.5 (3) MG/3ML nebulizer solution 3 mL (has no administration in time range)  LORazepam (ATIVAN) tablet 1 mg (1 mg Oral Given 11/21/17 1421)  megestrol (MEGACE) tablet 20 mg (20 mg Oral Given 11/21/17 1406)  mirtazapine (REMERON) tablet 15 mg (15 mg Oral Refused 11/21/17 1407)  famotidine (PEPCID) tablet 20 mg (has no administration in time range)  mometasone-formoterol (DULERA) 200-5 MCG/ACT inhaler 2 puff (2 puffs Inhalation Given 11/21/17 1406)  oxyCODONE-acetaminophen (PERCOCET/ROXICET) 5-325 MG per tablet 1-2 tablet (2 tablets Oral Given 11/21/17 1406)  umeclidinium bromide (INCRUSE ELLIPTA) 62.5 MCG/INH 1 puff (1 puff Inhalation Given 11/21/17 1407)  buprenorphine-naloxone (SUBOXONE) 8-2 mg per SL tablet 1 tablet (1 tablet Sublingual Refused 11/21/17 1417)  LORazepam (ATIVAN) injection 1 mg (1 mg Intravenous Given 11/21/17 0843)  sodium chloride 0.9 % bolus 500  mL (0 mLs Intravenous Stopped 11/21/17 1226)  fentaNYL (SUBLIMAZE) injection 50 mcg (50 mcg Intravenous Given 11/21/17 1003)  LORazepam (ATIVAN) injection 1 mg (1 mg Intravenous Given 11/21/17 1048)  dexamethasone (DECADRON) injection 10 mg (10 mg Intravenous Given 11/21/17 1408)  iohexol (OMNIPAQUE) 300 MG/ML solution 30 mL (30 mLs Oral Contrast Given 11/21/17 1301)     Initial Impression / Assessment and Plan / ED Course  I have reviewed the triage vital signs and the nursing notes.  Pertinent labs & imaging results that were available during my care of the patient were reviewed by me and considered in my medical decision making (see chart for details).     The brain metastasis appears to be causing her left-sided symptoms.  She also has abdominal pain but this is probably more chronic than anything else.  I discussed with Dr. Benay Spice and he recommends IV Decadron, admission, and he will arrange radiation oncology consult.  She has been ordered 10 mg IV Decadron now in the hospital service  will admit.  Final Clinical Impressions(s) / ED Diagnoses   Final diagnoses:  Brain metastasis [facility])    ED Discharge Orders    None       Sherwood Gambler, MD 11/21/17 1526

## 2017-11-21 NOTE — ED Notes (Signed)
ED Provider at bedside. 

## 2017-11-21 NOTE — ED Notes (Signed)
This RN attempted IV start. Patient is a very difficult IV start. Ronalee Belts RN will attempt IV [place].

## 2017-11-21 NOTE — H&P (Signed)
History and Physical  [name] LFY:[id number] DOB: [birth year] DOA: 11/21/2017  Referring physician: Joretta Bachelor, ER physician PCP: Azzie Glatter, FNP  Outpatient Specialists: Julieanne Manson, oncology Patient coming from: Home & is able to ambulate with a walker  Chief Complaint: Left-sided weakness  HPI: [name] is a 67 y.o. 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  She came to the emergency room today and symptoms were no better.  ED Course: In the emergency room, patient underwent a CT scan of the head and then an MRI which noted no evidence of CVA, but did note a 12 mm mass at the right frontoparietal vertex with surrounding edema.  No other lesion noted.  Given clinical history, felt to be metastases.  Rest of labs for patient unremarkable.  Given a dose of steroids.  Case discussed with oncology who recommended admitting the patient with plans to start radiation.  Hospitalist were called for further evaluation  Review of Systems: Patient seen in the emergency room. Pt complains of hurting all over, less control of her right arm and then her right leg.  Also complains of some wheezing.  Pt denies any headaches, vision changes, dysphagia, chest pain, palpitations, shortness of breath, abdominal pain, hematuria, dysuria, constipation, diarrhea, focal extremity numbness weakness or pain other than described above.  No nausea or vomiting..  Review of systems are otherwise negative   Past Medical History:  Diagnosis Date  . Cancer ([place])    renal ca  . COPD (chronic obstructive pulmonary  disease) ([place])   . Drug-seeking behavior   . Pancreatitis   . Pancreatitis   . Seizures ([place])   . Substance abuse [facility])    Past Surgical History:  Procedure Laterality Date  . HIP SURGERY Bilateral   . IR GENERIC HISTORICAL  05/08/2015   IR RADIOLOGIST EVAL & MGMT 05/08/2015 Aletta Edouard, MD [facility] INTERV RAD  . IR GENERIC HISTORICAL  01/08/2014   IR RADIOLOGIST EVAL & MGMT 01/08/2014 Aletta Edouard, MD [facility] INTERV RAD  . IR RADIOLOGY PERIPHERAL GUIDED IV START  10/03/2017  . IR RADIOLOGY PERIPHERAL GUIDED IV START  10/10/2017  . IR US GUIDE VASC ACCESS RIGHT  10/05/2017  . IR US GUIDE VASC ACCESS RIGHT  10/10/2017  . KIDNEY SURGERY     removed cancerous lesions  . PARTIAL GASTRECTOMY      Social History:  reports that she has been smoking cigarettes. She has a 15.00 pack-year smoking history. She has never used smokeless tobacco. She reports that she drank alcohol. She reports that she does not use drugs.  Lives with her husband.  Ambulates very little, uses a walker when she does   Allergies  Allergen Reactions  . Aspirin Other (See Comments)    Break out in welts  . Chlorpromazine Hcl Other (See Comments)    Muscle spasms - "really bad"    Family History  Problem Relation Age of Onset  . CAD Mother   . Alcoholism Father   . COPD Father   . Lung cancer Father   . Rectal cancer Maternal Uncle       Prior to Admission medications  Medication Sig Start Date End Date Taking? Authorizing Provider  DULERA 200-5 MCG/ACT AERO Inhale 2 puffs into the lungs 2 (two) times daily. 11/08/17  Yes [provider]  famotidine (PEPCID) 20 MG tablet Take 1 tablet (20 mg total) by mouth daily. Patient taking differently: Take 20 mg by mouth daily as needed for heartburn or indigestion.  08/28/17  Yes Khatri, Hina, PA-C  feeding supplement, ENSURE ENLIVE, (ENSURE ENLIVE) LIQD Take 237 mLs by mouth 3 (three) times daily between meals. 10/25/17  Yes Eugenie Filler, MD    ipratropium-albuterol (DUONEB) 0.5-2.5 (3) MG/3ML SOLN Take 3 mLs by nebulization every 6 (six) hours as needed. Use 3 times daily x 4 days, then every 6 hours as needed. 10/25/17  Yes Eugenie Filler, MD  LORazepam (ATIVAN) 1 MG tablet Take 1 tablet (1 mg total) by mouth every 8 (eight) hours as needed for anxiety. 10/25/17  Yes Eugenie Filler, MD  losartan (COZAAR) 50 MG tablet Take 50 mg by mouth daily.   Yes [provider]  megestrol (MEGACE) 20 MG tablet Take 1 tablet (20 mg total) by mouth daily. 11/18/17  Yes Azzie Glatter, FNP  mirtazapine (REMERON) 15 MG tablet Take 15 mg by mouth daily. 11/03/17  Yes [provider]  ondansetron (ZOFRAN) 4 MG tablet Take 1 tablet (4 mg total) by mouth daily as needed. 11/09/17  Yes Azzie Glatter, FNP  oxyCODONE-acetaminophen (PERCOCET) 5-325 MG tablet Take 1-2 tablets by mouth every 6 (six) hours as needed. 11/14/17  Yes Delo, Nathaneil Canary, MD  PROAIR HFA 108 [phone] Base) MCG/ACT inhaler Inhale 1 puff into the lungs every 4 (four) hours as needed for wheezing or shortness of breath. 11/01/17  Yes Azzie Glatter, FNP  SUBOXONE 8-2 MG FILM Take 8 mg by mouth daily. 10/30/17  Yes [provider]  tiotropium (SPIRIVA HANDIHALER) 18 MCG inhalation capsule PLACE 1 CAPSULE INTO INHALER AND INHALE DAILY Patient taking differently: Place 18 mcg into inhaler and inhale daily.  10/29/17  Yes Eugenie Filler, MD    Physical Exam: BP (!) 171/149   Pulse 93   Temp 97.8 F (36.6 C) (Oral)   Resp 15   SpO2 95%   General: Alert and oriented x3, mild distress secondary to pain.  Emaciated Eyes: Sclera nonicteric, extra ocular movements are intact ENT: Normocephalic and atraumatic, mucous memories are dry Neck: Supple, no JVD Cardiovascular: Regular rate and rhythm, S1-S2 Respiratory: Bilateral end expiratory wheezing, no crackles Abdomen: Soft, nontender, nondistended, positive bowel sounds Skin: Thin skin, and some  superficial bruising, but no skin breaks, tears or lesions Musculoskeletal: No clubbing or cyanosis or edema Psychiatric: Mild anxiety, but no evidence of acute psychoses Neurologic: Left side notes some decreased grip, flexion and extension at about a 4+/5 as compared to the right side which has some generalized weakness about a 5-/5.  Patient has similar findings for flexion and extension of the left lower extremity as compared to the right lower extremity.  Sensation appears intact.          Labs on Admission:  Basic Metabolic Panel: Recent Labs  Lab 11/21/17 0831 11/21/17 0844  NA 135 136  K 4.2 4.2  CL 101 103  CO2 24  --   GLUCOSE 99 94  BUN 21 22  CREATININE 0.92 0.90  CALCIUM 9.8  --   MG 2.2  --    Liver Function Tests: Recent Labs  Lab 11/21/17 0831  AST 19  ALT  12  ALKPHOS 73  BILITOT 0.3  PROT 7.4  ALBUMIN 4.1   Recent Labs  Lab 11/21/17 0831  LIPASE 18   No results for input(s): AMMONIA in the last 168 hours. CBC: Recent Labs  Lab 11/21/17 0831 11/21/17 0844  WBC 5.2  --   NEUTROABS 3.8  --   HGB 12.5 13.3  HCT 39.8 39.0  MCV 87.3  --   PLT 238  --    Cardiac Enzymes: Recent Labs  Lab 11/21/17 0831  CKTOTAL 39    BNP (last 3 results) No results for input(s): BNP in the last 8760 hours.  ProBNP (last 3 results) No results for input(s): PROBNP in the last 8760 hours.  CBG: No results for input(s): GLUCAP in the last 168 hours.  Radiological Exams on Admission: Ct Abdomen Pelvis Wo Contrast  Result Date: 11/21/2017 CLINICAL DATA:  Abdominal pain.  Patient refused IV contrast. EXAM: CT ABDOMEN AND PELVIS WITHOUT CONTRAST TECHNIQUE: Multidetector CT imaging of the abdomen and pelvis was performed following the standard protocol without IV contrast. COMPARISON:  08/30/2017, 10/10/2017 FINDINGS: Lower chest: Mild bibasilar scarring. Hepatobiliary: No focal liver abnormality is seen. No gallstones, gallbladder wall thickening. Persistent  stable dilatation of the common bile duct unchanged compared with multiple prior exams. Pancreas: Unremarkable. No pancreatic ductal dilatation or surrounding inflammatory changes. Spleen: Normal in size without focal abnormality. Adrenals/Urinary Tract: Adrenal glands are unremarkable. No urolithiasis or obstructive uropathy. Partially calcified 13 mm left lower pole renal mass consistent with posttreatment changes from prior ablation. Bladder is unremarkable. Stomach/Bowel: Stomach is within normal limits. No evidence of bowel wall thickening, distention, or inflammatory changes. Vascular/Lymphatic: Abdominal aortic atherosclerosis. Normal caliber abdominal aorta. No lymphadenopathy. Reproductive: Status post hysterectomy. No adnexal masses. Other: No abdominal wall hernia or abnormality. No abdominopelvic ascites. No pneumatosis, pneumoperitoneum or portal venous gas. Musculoskeletal: Degenerative disc disease with disc height loss at L2-3 and L5-S1. Minimal retrolisthesis of L2 on L3. Broad-based disc bulges at L2-3, L3-4, L4-5 and L5-S1. Bilateral facet arthropathy at L4-5 and L5-S1. Left foraminal stenosis at L5-S1. IMPRESSION: 1. No acute abdominal or pelvic pathology. 2.  Aortic Atherosclerosis (ICD10-I70.0). 3. Lumbar spine spondylosis. Electronically Signed   By: Kathreen Devoid   On: 11/21/2017 12:42   Dg Chest 2 View  Result Date: 11/21/2017 CLINICAL DATA:  Left-sided weakness and shortness of breath EXAM: CHEST - 2 VIEW COMPARISON:  November 14, 2017 chest radiograph and chest CT October 23, 2017 FINDINGS: Previously noted mass in the medial left lower lobe region is again noted, measuring 3.4 x 2.7 x 3.0 cm. There is no edema or consolidation. The heart size and pulmonary vascular normal. There is aortic atherosclerosis. No evident bone lesions. IMPRESSION: Persistent mass posteromedial left lower lobe region. No new opacity evident. No edema or consolidation. Stable cardiac silhouette. There is  aortic atherosclerosis. No evident adenopathy. Aortic Atherosclerosis (ICD10-I70.0). Electronically Signed   By: Lowella Grip III M.D.   On: 11/21/2017 08:19   Ct Head Wo Contrast  Result Date: 11/21/2017 CLINICAL DATA:  67 year old female with LEFT arm and LEFT leg weakness for 4 days. History of lung cancer. EXAM: CT HEAD WITHOUT CONTRAST CT CERVICAL SPINE WITHOUT CONTRAST TECHNIQUE: Multidetector CT imaging of the head and cervical spine was performed following the standard protocol without intravenous contrast. Multiplanar CT image reconstructions of the cervical spine were also generated. COMPARISON:  10/03/2017 PET CT FINDINGS: CT HEAD FINDINGS Brain: A 1 cm x 2.5 cm RIGHT frontoparietal hyperdensity/mass with moderate  adjacent vasogenic edema is noted likely representing malignancy/metastasis. There is no evidence of acute infarct, definite hemorrhage, extra-axial collection, midline shift or hydrocephalus. Atrophy and mild chronic small-vessel white matter ischemic changes are noted. Vascular: Atherosclerotic calcifications noted. Skull: Normal. Negative for fracture or focal lesion. Sinuses/Orbits: No acute finding. Other: None. CT CERVICAL SPINE FINDINGS Alignment: Normal. Skull base and vertebrae: No acute fracture. No definite bone lesion or focal pathologic process. Soft tissues and spinal canal: No prevertebral fluid or swelling. No visible canal hematoma. Disc levels: Moderate multilevel degenerative disc disease/spondylosis again noted. Upper chest: A 6 mm RIGHT apical ground-glass nodule is unchanged. No acute abnormality. Other: None IMPRESSION: 1. 1 x 2.5 cm RIGHT frontoparietal hyperdensity/mass with adjacent vasogenic edema highly suspicious for malignancy/metastasis. No midline shift, hydrocephalus or gross hemorrhage. MRI of the brain with and without contrast is recommended for further evaluation. 2. Atrophy and chronic small-vessel white matter ischemic changes. 3. Moderate  multilevel degenerative changes within the cervical spine without acute abnormality. 4. Unchanged 6 mm RIGHT apical ground-glass nodule. Electronically Signed   By: Margarette Canada M.D.   On: 11/21/2017 10:08   Ct Cervical Spine Wo Contrast  Result Date: 11/21/2017 CLINICAL DATA:  67 year old female with LEFT arm and LEFT leg weakness for 4 days. History of lung cancer. EXAM: CT HEAD WITHOUT CONTRAST CT CERVICAL SPINE WITHOUT CONTRAST TECHNIQUE: Multidetector CT imaging of the head and cervical spine was performed following the standard protocol without intravenous contrast. Multiplanar CT image reconstructions of the cervical spine were also generated. COMPARISON:  10/03/2017 PET CT FINDINGS: CT HEAD FINDINGS Brain: A 1 cm x 2.5 cm RIGHT frontoparietal hyperdensity/mass with moderate adjacent vasogenic edema is noted likely representing malignancy/metastasis. There is no evidence of acute infarct, definite hemorrhage, extra-axial collection, midline shift or hydrocephalus. Atrophy and mild chronic small-vessel white matter ischemic changes are noted. Vascular: Atherosclerotic calcifications noted. Skull: Normal. Negative for fracture or focal lesion. Sinuses/Orbits: No acute finding. Other: None. CT CERVICAL SPINE FINDINGS Alignment: Normal. Skull base and vertebrae: No acute fracture. No definite bone lesion or focal pathologic process. Soft tissues and spinal canal: No prevertebral fluid or swelling. No visible canal hematoma. Disc levels: Moderate multilevel degenerative disc disease/spondylosis again noted. Upper chest: A 6 mm RIGHT apical ground-glass nodule is unchanged. No acute abnormality. Other: None IMPRESSION: 1. 1 x 2.5 cm RIGHT frontoparietal hyperdensity/mass with adjacent vasogenic edema highly suspicious for malignancy/metastasis. No midline shift, hydrocephalus or gross hemorrhage. MRI of the brain with and without contrast is recommended for further evaluation. 2. Atrophy and chronic  small-vessel white matter ischemic changes. 3. Moderate multilevel degenerative changes within the cervical spine without acute abnormality. 4. Unchanged 6 mm RIGHT apical ground-glass nodule. Electronically Signed   By: Margarette Canada M.D.   On: 11/21/2017 10:08   Mr Brain Wo Contrast  Result Date: 11/21/2017 CLINICAL DATA:  Left arm and leg weakness, 4 days duration. Left lower lobe lung mass. History of renal cell cancer. EXAM: MRI HEAD WITHOUT CONTRAST TECHNIQUE: Multiplanar, multiecho pulse sequences of the brain and surrounding structures were obtained without intravenous contrast. COMPARISON:  CT 11/21/2017 FINDINGS: Brain: Study suffers from motion degradation. The patient would not allow contrast administration. There is a 12 mm mass at the right frontoparietal vertex with surrounding vasogenic edema. Given the clinical history, this most likely represents a metastasis. No second lesion is identified, though sensitivity is diminished in the absence of contrast. If the patient can be convinced, contrast-enhanced imaging would be useful. Elsewhere, there are  mild chronic small-vessel changes of the white matter. No hydrocephalus or extra-axial collection. No hemorrhage. Vascular: Major vessels at the base of the brain show flow. Skull and upper cervical spine: Negative Sinuses/Orbits: Clear/normal Other: None IMPRESSION: 12 mm mass at the right frontoparietal vertex with pronounced regional vasogenic edema. This is most likely a metastasis. No second lesion is identified. However, the patient would not allow contrast administration, and tiny other lesions are not excluded at this point. Electronically Signed   By: Nelson Chimes M.D.   On: 11/21/2017 11:43    EKG: Independently reviewed.  Sinus rhythm, left ventricular hypertrophy, PACs.  Assessment/Plan Present on Admission: . PANCREATITIS, CHRONIC- atrophic pancreas: Patient complains of ongoing chronic abdominal pain.  Lipase level normal.  Resuming  her normal home medications.  . Anxiety state: Continues to remain anxious.  Continue her home with benzodiazepines.  Marland Kitchen HTN (hypertension), malignant: Blood pressure stable, will continue.  . Protein-calorie malnutrition, severe ([place]): Patient's weight at less than 33 kg.  Continuing Ensure and Megace.  Nutrition to see.  Marland Kitchen COPD (chronic obstructive pulmonary disease) ([place]) inpatient with active tobacco abuse: Currently stable.  Mild wheezing on exam.  Continue nebulizers.  Patient declines nicotine patch because she says the patch itself burns.  . Opioid dependence/previous history of alcohol abuse: Followed by pain clinic.  On Suboxone and PRN Percocet.  We will continue home medications only.  . 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  Case discussed with oncology.  They will see and discussed with radiation oncology.  In the meantime, admit, start IV steroids.  Physical therapy to see.  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

## 2017-11-21 NOTE — ED Triage Notes (Signed)
Pt c/o left arm and leg weakness x 4 days. "see how this arm is flopping and I am not doing it". Also c/o abd pains.

## 2017-11-21 NOTE — Progress Notes (Signed)
Attempted to give pt contrast but IV did not work. Tried to stick pt but she refused contrast. Pt aborted exam

## 2017-11-21 NOTE — ED Notes (Signed)
ED TO INPATIENT HANDOFF REPORT  Name/Age/Gender [name] 67 y.o. female  Code Status Code Status History    Date Active Date Inactive Code Status Order ID Comments User Context   10/23/2017 2230 10/25/2017 1942 Full Code [id number]  Eugenie Filler, MD Inpatient   09/17/2017 1540 09/24/2017 1724 Full Code [id number]  Nita Sells, MD ED   06/21/2017 2201 06/28/2017 1543 Full Code [id number]  Bethena Roys, MD Inpatient   04/03/2017 1617 04/11/2017 [place] Full Code [id number]  Georgette Shell, MD ED   04/03/2017 1207 04/03/2017 1617 Full Code [id number]  Renita Papa, PA-C ED   02/28/2016 1050 02/29/2016 1439 Full Code [id number]  Annita Brod, MD Inpatient   01/03/2015 1440 01/05/2015 1428 Full Code [id number]  Theodis Blaze, MD Inpatient   07/03/2014 1716 07/05/2014 1540 Full Code [id number]  Cristal Ford, DO Inpatient   06/02/2013 0029 06/06/2013 1456 Full Code [id number]  Etta Quill, DO ED   03/28/2013 1941 04/01/2013 1925 Full Code [id number]  Marybelle Killings, MD Inpatient   03/28/2013 0550 03/28/2013 1941 Full Code [id number]  Berle Mull, MD ED   03/09/2013 2235 03/11/2013 2210 Full Code [id number]  Toy Baker, MD ED   02/09/2013 0247 02/11/2013 1915 Full Code [id number]  Etta Quill, DO ED      Home/SNF/Other Home  Chief Complaint leg and arms weak   Level of Care/Admitting Diagnosis ED Disposition    ED Disposition Condition [facility] Area: [facility] [[id number]]  Level of Care: Med-Surg [16]  Diagnosis: Brain metastasis [facility]) [[id number]]  Admitting Physician: Junius Argyle  Attending Physician: Junius Argyle  Estimated length of stay: past midnight tomorrow  Certification:: I certify this patient will need inpatient services for at least 2 midnights  PT Class (Do Not Modify): Inpatient [101]  PT Acc Code (Do Not Modify): Private [1]       Medical History Past Medical History:  Diagnosis  Date  . Cancer ([place])    renal ca  . COPD (chronic obstructive pulmonary disease) ([place])   . Drug-seeking behavior   . Pancreatitis   . Pancreatitis   . Seizures ([place])   . Substance abuse ([facility])     Allergies Allergies  Allergen Reactions  . Aspirin Other (See Comments)    Break out in welts  . Chlorpromazine Hcl Other (See Comments)    Muscle spasms - "really bad"    IV Location/Drains/Wounds Patient Lines/Drains/Airways Status   Active Line/Drains/Airways    Name:   Placement date:   Placement time:   Site:   Days:   Peripheral IV 11/14/17 Right Antecubital   11/14/17    1300    Antecubital   7   Peripheral IV 11/21/17 Right Antecubital   11/21/17    1334    Antecubital   less than 1   External Urinary Catheter   11/21/17    0826    -   less than 1   Incision (Closed) 10/10/17 Back Left;Lower   10/10/17    1200     42          Labs/Imaging Results for orders placed or performed during the hospital encounter of 11/21/17 (from the past 48 hour(s))  Protime-INR     Status: None   Collection Time: 11/21/17  8:31 AM  Result Value Ref Range   Prothrombin Time 12.5 11.4 - 15.2 seconds   INR 0.94  Comment: Performed at [facility], [place] [address], [place], [place] [address]  APTT     Status: None   Collection Time: 11/21/17  8:31 AM  Result Value Ref Range   aPTT 31 24 - 36 seconds    Comment: Performed at [facility], [place] [address] [place], [place] [address]  Comprehensive metabolic panel     Status: None   Collection Time: 11/21/17  8:31 AM  Result Value Ref Range   Sodium 135 135 - 145 mmol/L   Potassium 4.2 3.5 - 5.1 mmol/L   Chloride 101 98 - 111 mmol/L   CO2 24 22 - 32 mmol/L   Glucose, Bld 99 70 - 99 mg/dL   BUN 21 8 - 23 mg/dL   Creatinine, Ser 0.92 0.44 - 1.00 mg/dL   Calcium 9.8 8.9 - 10.3 mg/dL   Total Protein 7.4 6.5 - 8.1 g/dL   Albumin 4.1 3.5 - 5.0 g/dL   AST 19 15 - 41 U/L   ALT 12 0 - 44 U/L   Alkaline  Phosphatase 73 38 - 126 U/L   Total Bilirubin 0.3 0.3 - 1.2 mg/dL   GFR calc non Af Amer >60 >60 mL/min   GFR calc Af Amer >60 >60 mL/min    Comment: (NOTE) The eGFR has been calculated using the CKD EPI equation. This calculation has not been validated in all clinical situations. eGFR's persistently <60 mL/min signify possible Chronic Kidney Disease.    Anion gap 10 5 - 15    Comment: Performed at [facility], [place] [address], [place], [place] [address]  CBC WITH DIFFERENTIAL     Status: Abnormal   Collection Time: 11/21/17  8:31 AM  Result Value Ref Range   WBC 5.2 4.0 - 10.5 K/uL   RBC 4.56 3.87 - 5.11 MIL/uL   Hemoglobin 12.5 12.0 - 15.0 g/dL   HCT 39.8 36.0 - 46.0 %   MCV 87.3 80.0 - 100.0 fL   MCH 27.4 26.0 - 34.0 pg   MCHC 31.4 30.0 - 36.0 g/dL   RDW 15.7 (H) 11.5 - 15.5 %   Platelets 238 150 - 400 K/uL   nRBC 0.0 0.0 - 0.2 %   Neutrophils Relative % 73 %   Neutro Abs 3.8 1.7 - 7.7 K/uL   Lymphocytes Relative 13 %   Lymphs Abs 0.7 0.7 - 4.0 K/uL   Monocytes Relative 10 %   Monocytes Absolute 0.5 0.1 - 1.0 K/uL   Eosinophils Relative 3 %   Eosinophils Absolute 0.2 0.0 - 0.5 K/uL   Basophils Relative 1 %   Basophils Absolute 0.0 0.0 - 0.1 K/uL   Immature Granulocytes 0 %   Abs Immature Granulocytes 0.02 0.00 - 0.07 K/uL    Comment: Performed at [facility], [place] [address] [place], [place] [address]  Magnesium     Status: None   Collection Time: 11/21/17  8:31 AM  Result Value Ref Range   Magnesium 2.2 1.7 - 2.4 mg/dL    Comment: Performed at [facility], [place] [address] [place], [place] [address]  Lipase, blood     Status: None   Collection Time: 11/21/17  8:31 AM  Result Value Ref Range   Lipase 18 11 - 51 U/L    Comment: Performed at [facility], [place] [address], [place], [place] [address]  CK     Status: None   Collection Time: 11/21/17  8:31 AM  Result Value Ref Range   Total CK  39 38 - 234 U/L    Comment: Performed at [facility], [place] [address] [place], [place] [address]  Ethanol     Status: None   Collection Time: 11/21/17  8:37 AM  Result Value Ref Range   Alcohol, Ethyl (B) <10 <10 mg/dL    Comment: (NOTE) Lowest detectable limit for serum alcohol is 10 mg/dL. For medical purposes only. Performed at [facility], [place] [address], [place], [place] [address]   I-stat troponin, ED     Status: None   Collection Time: 11/21/17  8:43 AM  Result Value Ref Range   Troponin i, poc 0.01 0.00 - 0.08 ng/mL   Comment 3            Comment: Due to the release kinetics of cTnI, a negative result within the first hours of the onset of symptoms does not rule out myocardial infarction with certainty. If myocardial infarction is still suspected, repeat the test at appropriate intervals.   I-Stat Chem 8, ED     Status: None   Collection Time: 11/21/17  8:44 AM  Result Value Ref Range   Sodium 136 135 - 145 mmol/L   Potassium 4.2 3.5 - 5.1 mmol/L   Chloride 103 98 - 111 mmol/L   BUN 22 8 - 23 mg/dL   Creatinine, Ser 0.90 0.44 - 1.00 mg/dL   Glucose, Bld 94 70 - 99 mg/dL   Calcium, Ion 1.22 1.15 - 1.40 mmol/L   TCO2 26 22 - 32 mmol/L   Hemoglobin 13.3 12.0 - 15.0 g/dL   HCT 39.0 36.0 - 46.0 %   Ct Abdomen Pelvis Wo Contrast  Result Date: 11/21/2017 CLINICAL DATA:  Abdominal pain.  Patient refused IV contrast. EXAM: CT ABDOMEN AND PELVIS WITHOUT CONTRAST TECHNIQUE: Multidetector CT imaging of the abdomen and pelvis was performed following the standard protocol without IV contrast. COMPARISON:  08/30/2017, 10/10/2017 FINDINGS: Lower chest: Mild bibasilar scarring. Hepatobiliary: No focal liver abnormality is seen. No gallstones, gallbladder wall thickening. Persistent stable dilatation of the common bile duct unchanged compared with multiple prior exams. Pancreas: Unremarkable. No pancreatic ductal dilatation or surrounding  inflammatory changes. Spleen: Normal in size without focal abnormality. Adrenals/Urinary Tract: Adrenal glands are unremarkable. No urolithiasis or obstructive uropathy. Partially calcified 13 mm left lower pole renal mass consistent with posttreatment changes from prior ablation. Bladder is unremarkable. Stomach/Bowel: Stomach is within normal limits. No evidence of bowel wall thickening, distention, or inflammatory changes. Vascular/Lymphatic: Abdominal aortic atherosclerosis. Normal caliber abdominal aorta. No lymphadenopathy. Reproductive: Status post hysterectomy. No adnexal masses. Other: No abdominal wall hernia or abnormality. No abdominopelvic ascites. No pneumatosis, pneumoperitoneum or portal venous gas. Musculoskeletal: Degenerative disc disease with disc height loss at L2-3 and L5-S1. Minimal retrolisthesis of L2 on L3. Broad-based disc bulges at L2-3, L3-4, L4-5 and L5-S1. Bilateral facet arthropathy at L4-5 and L5-S1. Left foraminal stenosis at L5-S1. IMPRESSION: 1. No acute abdominal or pelvic pathology. 2.  Aortic Atherosclerosis (ICD10-I70.0). 3. Lumbar spine spondylosis. Electronically Signed   By: Kathreen Devoid   On: 11/21/2017 12:42   Dg Chest 2 View  Result Date: 11/21/2017 CLINICAL DATA:  Left-sided weakness and shortness of breath EXAM: CHEST - 2 VIEW COMPARISON:  November 14, 2017 chest radiograph and chest CT October 23, 2017 FINDINGS: Previously noted mass in the medial left lower lobe region is again noted, measuring 3.4 x 2.7 x 3.0 cm. There is  no edema or consolidation. The heart size and pulmonary vascular normal. There is aortic atherosclerosis. No evident bone lesions. IMPRESSION: Persistent mass posteromedial left lower lobe region. No new opacity evident. No edema or consolidation. Stable cardiac silhouette. There is aortic atherosclerosis. No evident adenopathy. Aortic Atherosclerosis (ICD10-I70.0). Electronically Signed   By: Lowella Grip III M.D.   On: 11/21/2017 08:19    Ct Head Wo Contrast  Result Date: 11/21/2017 CLINICAL DATA:  67 year old female with LEFT arm and LEFT leg weakness for 4 days. History of lung cancer. EXAM: CT HEAD WITHOUT CONTRAST CT CERVICAL SPINE WITHOUT CONTRAST TECHNIQUE: Multidetector CT imaging of the head and cervical spine was performed following the standard protocol without intravenous contrast. Multiplanar CT image reconstructions of the cervical spine were also generated. COMPARISON:  10/03/2017 PET CT FINDINGS: CT HEAD FINDINGS Brain: A 1 cm x 2.5 cm RIGHT frontoparietal hyperdensity/mass with moderate adjacent vasogenic edema is noted likely representing malignancy/metastasis. There is no evidence of acute infarct, definite hemorrhage, extra-axial collection, midline shift or hydrocephalus. Atrophy and mild chronic small-vessel white matter ischemic changes are noted. Vascular: Atherosclerotic calcifications noted. Skull: Normal. Negative for fracture or focal lesion. Sinuses/Orbits: No acute finding. Other: None. CT CERVICAL SPINE FINDINGS Alignment: Normal. Skull base and vertebrae: No acute fracture. No definite bone lesion or focal pathologic process. Soft tissues and spinal canal: No prevertebral fluid or swelling. No visible canal hematoma. Disc levels: Moderate multilevel degenerative disc disease/spondylosis again noted. Upper chest: A 6 mm RIGHT apical ground-glass nodule is unchanged. No acute abnormality. Other: None IMPRESSION: 1. 1 x 2.5 cm RIGHT frontoparietal hyperdensity/mass with adjacent vasogenic edema highly suspicious for malignancy/metastasis. No midline shift, hydrocephalus or gross hemorrhage. MRI of the brain with and without contrast is recommended for further evaluation. 2. Atrophy and chronic small-vessel white matter ischemic changes. 3. Moderate multilevel degenerative changes within the cervical spine without acute abnormality. 4. Unchanged 6 mm RIGHT apical ground-glass nodule. Electronically Signed   By:  Margarette Canada M.D.   On: 11/21/2017 10:08   Ct Cervical Spine Wo Contrast  Result Date: 11/21/2017 CLINICAL DATA:  67 year old female with LEFT arm and LEFT leg weakness for 4 days. History of lung cancer. EXAM: CT HEAD WITHOUT CONTRAST CT CERVICAL SPINE WITHOUT CONTRAST TECHNIQUE: Multidetector CT imaging of the head and cervical spine was performed following the standard protocol without intravenous contrast. Multiplanar CT image reconstructions of the cervical spine were also generated. COMPARISON:  10/03/2017 PET CT FINDINGS: CT HEAD FINDINGS Brain: A 1 cm x 2.5 cm RIGHT frontoparietal hyperdensity/mass with moderate adjacent vasogenic edema is noted likely representing malignancy/metastasis. There is no evidence of acute infarct, definite hemorrhage, extra-axial collection, midline shift or hydrocephalus. Atrophy and mild chronic small-vessel white matter ischemic changes are noted. Vascular: Atherosclerotic calcifications noted. Skull: Normal. Negative for fracture or focal lesion. Sinuses/Orbits: No acute finding. Other: None. CT CERVICAL SPINE FINDINGS Alignment: Normal. Skull base and vertebrae: No acute fracture. No definite bone lesion or focal pathologic process. Soft tissues and spinal canal: No prevertebral fluid or swelling. No visible canal hematoma. Disc levels: Moderate multilevel degenerative disc disease/spondylosis again noted. Upper chest: A 6 mm RIGHT apical ground-glass nodule is unchanged. No acute abnormality. Other: None IMPRESSION: 1. 1 x 2.5 cm RIGHT frontoparietal hyperdensity/mass with adjacent vasogenic edema highly suspicious for malignancy/metastasis. No midline shift, hydrocephalus or gross hemorrhage. MRI of the brain with and without contrast is recommended for further evaluation. 2. Atrophy and chronic small-vessel white matter ischemic changes. 3. Moderate multilevel degenerative  changes within the cervical spine without acute abnormality. 4. Unchanged 6 mm RIGHT apical  ground-glass nodule. Electronically Signed   By: Margarette Canada M.D.   On: 11/21/2017 10:08   Mr Brain Wo Contrast  Result Date: 11/21/2017 CLINICAL DATA:  Left arm and leg weakness, 4 days duration. Left lower lobe lung mass. History of renal cell cancer. EXAM: MRI HEAD WITHOUT CONTRAST TECHNIQUE: Multiplanar, multiecho pulse sequences of the brain and surrounding structures were obtained without intravenous contrast. COMPARISON:  CT 11/21/2017 FINDINGS: Brain: Study suffers from motion degradation. The patient would not allow contrast administration. There is a 12 mm mass at the right frontoparietal vertex with surrounding vasogenic edema. Given the clinical history, this most likely represents a metastasis. No second lesion is identified, though sensitivity is diminished in the absence of contrast. If the patient can be convinced, contrast-enhanced imaging would be useful. Elsewhere, there are mild chronic small-vessel changes of the white matter. No hydrocephalus or extra-axial collection. No hemorrhage. Vascular: Major vessels at the base of the brain show flow. Skull and upper cervical spine: Negative Sinuses/Orbits: Clear/normal Other: None IMPRESSION: 12 mm mass at the right frontoparietal vertex with pronounced regional vasogenic edema. This is most likely a metastasis. No second lesion is identified. However, the patient would not allow contrast administration, and tiny other lesions are not excluded at this point. Electronically Signed   By: Nelson Chimes M.D.   On: 11/21/2017 11:43    Pending Labs Unresulted Labs (From admission, onward)    Start     Ordered   11/21/17 0745  Urine rapid drug screen (hosp performed)  ONCE - STAT,   STAT     11/21/17 0745   11/21/17 0745  Urinalysis, Routine w reflex microscopic  ONCE - STAT,   STAT     11/21/17 0745   Signed and Held  CBC  (enoxaparin (LOVENOX)    CrCl >/= 30 ml/min)  Once,   R    Comments:  Baseline for enoxaparin therapy IF NOT ALREADY  DRAWN.  Notify MD if PLT < 100 K.    Signed and Held   Signed and Held  Creatinine, serum  (enoxaparin (LOVENOX)    CrCl >/= 30 ml/min)  Once,   R    Comments:  Baseline for enoxaparin therapy IF NOT ALREADY DRAWN.    Signed and Held   Signed and Held  Creatinine, serum  (enoxaparin (LOVENOX)    CrCl >/= 30 ml/min)  Weekly,   R    Comments:  while on enoxaparin therapy    Signed and Held   Signed and Held  Basic metabolic panel  Tomorrow morning,   R     Signed and Held   Signed and Held  CBC  Tomorrow morning,   R     Signed and Held          Vitals/Pain Today's Vitals   11/21/17 1015 11/21/17 1310 11/21/17 1311 11/21/17 1335  BP:  (!) 171/149  (!) 171/149  Pulse: 95  89 93  Resp: 17   15  Temp:      TempSrc:      SpO2: 94%  100% 95%    Isolation Precautions No active isolations  Medications Medications  gadobutrol (GADAVIST) 1 MMOL/ML injection 3.5 mL (has no administration in time range)  feeding supplement (ENSURE ENLIVE) (ENSURE ENLIVE) liquid 237 mL (has no administration in time range)  ipratropium-albuterol (DUONEB) 0.5-2.5 (3) MG/3ML nebulizer solution 3 mL (has no administration in  time range)  LORazepam (ATIVAN) tablet 1 mg (1 mg Oral Given 11/21/17 1421)  megestrol (MEGACE) tablet 20 mg (20 mg Oral Given 11/21/17 1406)  mirtazapine (REMERON) tablet 15 mg (15 mg Oral Refused 11/21/17 1407)  famotidine (PEPCID) tablet 20 mg (has no administration in time range)  mometasone-formoterol (DULERA) 200-5 MCG/ACT inhaler 2 puff (2 puffs Inhalation Given 11/21/17 1406)  oxyCODONE-acetaminophen (PERCOCET/ROXICET) 5-325 MG per tablet 1-2 tablet (2 tablets Oral Given 11/21/17 1406)  umeclidinium bromide (INCRUSE ELLIPTA) 62.5 MCG/INH 1 puff (1 puff Inhalation Given 11/21/17 1407)  buprenorphine-naloxone (SUBOXONE) 8-2 mg per SL tablet 1 tablet (1 tablet Sublingual Refused 11/21/17 1417)  LORazepam (ATIVAN) injection 1 mg (1 mg Intravenous Given 11/21/17 0843)  sodium  chloride 0.9 % bolus 500 mL (0 mLs Intravenous Stopped 11/21/17 1226)  fentaNYL (SUBLIMAZE) injection 50 mcg (50 mcg Intravenous Given 11/21/17 1003)  LORazepam (ATIVAN) injection 1 mg (1 mg Intravenous Given 11/21/17 1048)  dexamethasone (DECADRON) injection 10 mg (10 mg Intravenous Given 11/21/17 1408)  iohexol (OMNIPAQUE) 300 MG/ML solution 30 mL (30 mLs Oral Contrast Given 11/21/17 1301)    Mobility walks with person assist

## 2017-11-21 NOTE — ED Notes (Signed)
Unable to administer 1230 pm med due to loss of IV access. IV became infiltrated. This writer is unable to start new IV. [place] IV will be started by another RN.

## 2017-11-21 NOTE — Progress Notes (Signed)
Ms [name] refused the decadron 10 mg dose, saying they gave it to her in the ED and it caused burning

## 2017-11-21 NOTE — Progress Notes (Signed)
Ms. Luster refused her saline infusion. MD aware.

## 2017-11-21 NOTE — ED Notes (Signed)
Patient made aware of need for urine sample. Patient unable to provide at this time.

## 2017-11-21 NOTE — ED Notes (Signed)
Family at bedside. 

## 2017-11-21 NOTE — ED Notes (Signed)
This RN has given report to [employer]. Patient is waiting to be transported.

## 2017-11-22 ENCOUNTER — Ambulatory Visit (HOSPITAL_COMMUNITY)
Admit: 2017-11-22 | Discharge: 2017-11-22 | Disposition: A | Discharge status: Home / Self Care | Payer: Medicaid Other | Attending: Radiation Oncology | Admitting: Radiation Oncology

## 2017-11-22 DIAGNOSIS — G894 Chronic pain syndrome: Secondary | ICD-10-CM

## 2017-11-22 DIAGNOSIS — K859 Acute pancreatitis without necrosis or infection, unspecified: Secondary | ICD-10-CM

## 2017-11-22 DIAGNOSIS — D649 Anemia, unspecified: Secondary | ICD-10-CM

## 2017-11-22 DIAGNOSIS — C7931 Secondary malignant neoplasm of brain: Principal | ICD-10-CM

## 2017-11-22 DIAGNOSIS — C801 Malignant (primary) neoplasm, unspecified: Secondary | ICD-10-CM

## 2017-11-22 LAB — URINALYSIS, ROUTINE W REFLEX MICROSCOPIC
BILIRUBIN URINE: NEGATIVE
Bacteria, UA: NONE SEEN
Glucose, UA: >=500 mg/dL — AB
Ketones, ur: NEGATIVE mg/dL
Leukocytes, UA: NEGATIVE
Nitrite: NEGATIVE
PH: 6 (ref 5.0–8.0)
Protein, ur: NEGATIVE mg/dL
SPECIFIC GRAVITY, URINE: 1.014 (ref 1.005–1.030)

## 2017-11-22 LAB — RAPID URINE DRUG SCREEN, HOSP PERFORMED
AMPHETAMINES: NOT DETECTED
BARBITURATES: NOT DETECTED
BENZODIAZEPINES: POSITIVE — AB
Cocaine: NOT DETECTED
Opiates: POSITIVE — AB
TETRAHYDROCANNABINOL: NOT DETECTED

## 2017-11-22 MED ORDER — DEXAMETHASONE 4 MG PO TABS: 10.0000 mg | ORAL_TABLET | Freq: Once | ORAL | Status: DC

## 2017-11-22 MED ORDER — GADOBUTROL 1 MMOL/ML IV SOLN
3.0000 mL | Freq: Once | INTRAVENOUS | Status: AC | PRN
  Administered 2017-11-22: 3 mL via INTRAVENOUS

## 2017-11-22 MED ORDER — SODIUM CHLORIDE 0.9% FLUSH: 10.0000 mL | INTRAVENOUS | Status: DC | PRN

## 2017-11-22 MED ORDER — ADULT MULTIVITAMIN W/MINERALS CH
1.0000 | ORAL_TABLET | Freq: Every day | ORAL | Status: DC
  Administered 2017-11-23: 1 via ORAL
  Filled 2017-11-22: qty 1

## 2017-11-22 MED ORDER — DEXAMETHASONE 4 MG PO TABS
10.0000 mg | ORAL_TABLET | Freq: Once | ORAL | Status: AC
  Administered 2017-11-22: 10 mg via ORAL
  Filled 2017-11-22: qty 3

## 2017-11-22 MED ORDER — SODIUM CHLORIDE 0.9% FLUSH
10.0000 mL | Freq: Two times a day (BID) | INTRAVENOUS | Status: DC
  Administered 2017-11-22: 10 mL

## 2017-11-22 MED ORDER — DEXAMETHASONE 4 MG PO TABS
4.0000 mg | ORAL_TABLET | Freq: Two times a day (BID) | ORAL | Status: DC
  Administered 2017-11-22 – 2017-11-23 (×3): 4 mg via ORAL
  Filled 2017-11-22 (×3): qty 1

## 2017-11-22 MED ORDER — HYDROMORPHONE HCL 2 MG PO TABS
2.0000 mg | ORAL_TABLET | Freq: Once | ORAL | Status: AC
  Administered 2017-11-22: 2 mg via ORAL
  Filled 2017-11-22: qty 1

## 2017-11-22 NOTE — Progress Notes (Signed)
Patient's IV is occluded - I paged IV team to place a new IV - after two attempts even with ultrasound, IV team could nto get IV and patient stated she did not want another one since she has already been stuck enough. Patient is a great candidate for a possible PICC or PORT if doctor approves due to the fragile veins. Patient had IV decadron to be given - doctor on-call switched it to PO starting at 6am - she did miss her 12am dose due to no IV access.

## 2017-11-22 NOTE — Evaluation (Addendum)
Physical Therapy Evaluation Patient Details Name: [name] MRN: [id number] DOB: [birth year] Today's Date: 11/22/2017   History of Present Illness  67 yo female admitted tp ED with LUE and LLE weakness, slurred speech. 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 No acute infarcts noted, MRI to follow. PMH includes renal cancer, COPD, seizures, substance abuse, OA, LBP, FTT, R ischial tuberosity pressure injury, benzodiazepine dependence, chronic alcoholic pacreatitis, tobacco abuse, HTN, anxiety, dyspnea, bilateral hip surgery.    Clinical Impression   Pt presents with total body weakness L weakness>R, hamstring tightness, decreased sitting and standing balance, difficulty performing mobility tasks, decreased tolerance for ambulation, and L lateral leaning during ambulation. Pt to benefit from acute PT to address deficits. Pt ambulated 2x5 ft in room and was very unsteady, would start leaning towards L and could not self-correct LOB. Recommending ST-SNF for current deficits. PT to progress mobility as tolerated, and will continue to follow acutely.      Follow Up Recommendations SNF;Supervision for mobility/OOB    Equipment Recommendations  None recommended by PT    Recommendations for Other Services       Precautions / Restrictions Precautions Precautions: Fall Restrictions Weight Bearing Restrictions: No      Mobility  Bed Mobility Overal bed mobility: Needs Assistance Bed Mobility: Supine to Sit     Supine to sit: Min assist;HOB elevated;Min guard     General bed mobility comments: Min assist for scooting to EOB, pt with min guard for rest of bed mobility for safety. Increased time/effort   Transfers Overall transfer level: Needs assistance Equipment used: Rolling walker (2 wheeled) Transfers: Sit to/from [facility] Sit to Stand: Min assist;+2 safety/equipment;From  elevated surface Stand pivot transfers: Min assist;+2 safety/equipment       General transfer comment: Min assist for power up and steadying. Stand pivot x3, from chair to [facility], from [facility] to chair, and from chair to bed. Min assist for steadying.   Ambulation/Gait Ambulation/Gait assistance: Mod assist;+2 safety/equipment Gait Distance (Feet): 10 Feet(2 x 84ft ) Assistive device: Rolling walker (2 wheeled) Gait Pattern/deviations: Step-to pattern;Decreased stride length;Narrow base of support Gait velocity: decr    General Gait Details: Mod assist for steadying, keeping pt upright. After 2-3 steps, pt with L lateral leaning requiring correction from PT. Close chair follow for ambulation. Decreased L foot clearance due to lack of DF.   Stairs            Wheelchair Mobility    Modified Rankin (Stroke Patients Only)       Balance Overall balance assessment: Needs assistance;History of Falls Sitting-balance support: Bilateral upper extremity supported Sitting balance-Leahy Scale: Fair     Standing balance support: Bilateral upper extremity supported;During functional activity Standing balance-Leahy Scale: Poor Standing balance comment: relies on RW and PT for steadying                              Pertinent Vitals/Pain Pain Assessment: 0-10 Pain Score: 7  Pain Location: legs, stomach  Pain Descriptors / Indicators: Sore;Aching Pain Intervention(s): Limited activity within patient's tolerance;Repositioned;Monitored during session;Patient requesting pain meds-RN notified    Home Living Family/patient expects to be discharged to:: Private residence Living Arrangements: Spouse/significant other Available Help at Discharge: Family Type of Home: House Home Access: Stairs to enter Entrance Stairs-Rails: Right Entrance Stairs-Number of Steps: 5 Home Layout: One level Home Equipment:  Walker - 2 wheels;Bedside commode;Walker - 4 wheels Additional Comments: husband  going back to work within the month, he had previously taken off work to assist pt     Prior Function Level of Independence: Needs assistance      ADL's / Homemaking Assistance Needed: Pt reports she was getting assist for cooking, cleaning, dressing, and bathing from husband and RN         Hand Dominance   Dominant Hand: Right    Extremity/Trunk Assessment   Upper Extremity Assessment Upper Extremity Assessment: Defer to OT evaluation    Lower Extremity Assessment Lower Extremity Assessment: RLE deficits/detail;LLE deficits/detail RLE Deficits / Details: 3/5 hip flexion/extension, 3+/5 hip adduction/abduction, 3-/5 knee extension, 3/5 DF/PF RLE Sensation: (Not tested - to assess next visit ) LLE Deficits / Details: 3-/5 hip flexion/extension, 3/5 hip adduction/abduction, 3-/5 knee extension, 3-/5 DF/PF LLE Sensation: (not tested - to assess next visit)    Cervical / Trunk Assessment Cervical / Trunk Assessment: Normal  Communication   Communication: HOH  Cognition Arousal/Alertness: Awake/alert Behavior During Therapy: WFL for tasks assessed/performed Overall Cognitive Status: Within Functional Limits for tasks assessed                                        General Comments General comments (skin integrity, edema, etc.): Smooth pursuits: pt noted difficulty looking to L, frequent blinking. Bilateral hamstring tightness    Exercises     Assessment/Plan    PT Assessment Patient needs continued PT services  PT Problem List Decreased strength;Pain;Decreased range of motion       PT Treatment Interventions DME instruction;Therapeutic activities;Gait training;Therapeutic exercise;Patient/family education;Balance training;Stair training;Functional mobility training    PT Goals (Current goals can be found in the Care Plan section)  Acute Rehab PT Goals Patient Stated Goal: walk like before  PT Goal Formulation: With patient Time For Goal  Achievement: 12/06/17 Potential to Achieve Goals: Fair    Frequency Min 3X/week   Barriers to discharge        Co-evaluation               AM-PAC PT "6 Clicks" Daily Activity  Outcome Measure Difficulty turning over in bed (including adjusting bedclothes, sheets and blankets)?: Unable Difficulty moving from lying on back to sitting on the side of the bed? : Unable Difficulty sitting down on and standing up from a chair with arms (e.g., wheelchair, bedside commode, etc,.)?: Unable Help needed moving to and from a bed to chair (including a wheelchair)?: A Little Help needed walking in hospital room?: A Lot Help needed climbing 3-5 steps with a railing? : Total 6 Click Score: 9    End of Session Equipment Utilized During Treatment: Gait belt;Oxygen(on 3LO2 via [place] during PT; unable to get O2sat reading with pulse ox even with multiple attempts) Activity Tolerance: Patient limited by fatigue Patient left: in bed;with bed alarm set;with call bell/phone within reach;with nursing/sitter in room Nurse Communication: Mobility status PT Visit Diagnosis: Unsteadiness on feet (R26.81);Difficulty in walking, not elsewhere classified (R26.2);Muscle weakness (generalized) (M62.81)    Time: 8546-2703 PT Time Calculation (min) (ACUTE ONLY): 23 min   Charges:   PT Evaluation $PT Eval Moderate Complexity: 1 Mod         Kimmie Doren Conception Chancy, PT Acute Rehabilitation Services Pager [phone]  Office [phone]   Alijah Hyde D Elonda Husky 11/22/2017, 4:12 PM

## 2017-11-22 NOTE — Progress Notes (Addendum)
IP PROGRESS NOTE  Subjective:   [name] was admitted yesterday with left-sided weakness.  She continues to have pain in the abdomen.  A CT of the brain revealed a 1 x 2.5 cm right frontal mass with adjacent edema And MRI of the brain revealed a 12 mm right frontoparietal mass with associated vasogenic edema.  This was a noncontrast scan.  No other lesions were seen. She was placed on Decadron.  She reports improvement in the left-sided weakness. Objective: Vital signs in last 24 hours: Blood pressure 116/89, pulse 93, temperature 98.4 F (36.9 C), temperature source Oral, resp. rate 18, SpO2 100 %.  Intake/Output from previous day: 11/18 0701 - 11/19 0700 In: 1357.4 [P.O.:355; I.V.:2.4; IV Piggyback:1000] Out: -   Physical Exam:  HEENT: No thrush Lungs: Distant breath sounds, no respiratory distress Cardiac: Regular rate and rhythm Abdomen: No hepatomegaly, nontender Extremities: No leg edema Neurologic: Alert, follows commands, moves all extremities, 4+/5 strength in the left arm and leg    Lab Results: Recent Labs    11/21/17 0831 11/21/17 0844  WBC 5.2  --   HGB 12.5 13.3  HCT 39.8 39.0  PLT 238  --     BMET Recent Labs    11/21/17 0831 11/21/17 0844  NA 135 136  K 4.2 4.2  CL 101 103  CO2 24  --   GLUCOSE 99 94  BUN 21 22  CREATININE 0.92 0.90  CALCIUM 9.8  --     Lab Results  Component Value Date   CEA1 7.08 (H) 10/19/2017    Studies/Results: Ct Abdomen Pelvis Wo Contrast  Result Date: 11/21/2017 CLINICAL DATA:  Abdominal pain.  Patient refused IV contrast. EXAM: CT ABDOMEN AND PELVIS WITHOUT CONTRAST TECHNIQUE: Multidetector CT imaging of the abdomen and pelvis was performed following the standard protocol without IV contrast. COMPARISON:  08/30/2017, 10/10/2017 FINDINGS: Lower chest: Mild bibasilar scarring. Hepatobiliary: No focal liver abnormality is seen. No gallstones, gallbladder wall thickening. Persistent stable dilatation of the common  bile duct unchanged compared with multiple prior exams. Pancreas: Unremarkable. No pancreatic ductal dilatation or surrounding inflammatory changes. Spleen: Normal in size without focal abnormality. Adrenals/Urinary Tract: Adrenal glands are unremarkable. No urolithiasis or obstructive uropathy. Partially calcified 13 mm left lower pole renal mass consistent with posttreatment changes from prior ablation. Bladder is unremarkable. Stomach/Bowel: Stomach is within normal limits. No evidence of bowel wall thickening, distention, or inflammatory changes. Vascular/Lymphatic: Abdominal aortic atherosclerosis. Normal caliber abdominal aorta. No lymphadenopathy. Reproductive: Status post hysterectomy. No adnexal masses. Other: No abdominal wall hernia or abnormality. No abdominopelvic ascites. No pneumatosis, pneumoperitoneum or portal venous gas. Musculoskeletal: Degenerative disc disease with disc height loss at L2-3 and L5-S1. Minimal retrolisthesis of L2 on L3. Broad-based disc bulges at L2-3, L3-4, L4-5 and L5-S1. Bilateral facet arthropathy at L4-5 and L5-S1. Left foraminal stenosis at L5-S1. IMPRESSION: 1. No acute abdominal or pelvic pathology. 2.  Aortic Atherosclerosis (ICD10-I70.0). 3. Lumbar spine spondylosis. Electronically Signed   By: Kathreen Devoid   On: 11/21/2017 12:42   Dg Chest 2 View  Result Date: 11/21/2017 CLINICAL DATA:  Left-sided weakness and shortness of breath EXAM: CHEST - 2 VIEW COMPARISON:  November 14, 2017 chest radiograph and chest CT October 23, 2017 FINDINGS: Previously noted mass in the medial left lower lobe region is again noted, measuring 3.4 x 2.7 x 3.0 cm. There is no edema or consolidation. The heart size and pulmonary vascular normal. There is aortic atherosclerosis. No evident bone lesions. IMPRESSION: Persistent  mass posteromedial left lower lobe region. No new opacity evident. No edema or consolidation. Stable cardiac silhouette. There is aortic atherosclerosis. No evident  adenopathy. Aortic Atherosclerosis (ICD10-I70.0). Electronically Signed   By: Lowella Grip III M.D.   On: 11/21/2017 08:19   Ct Head Wo Contrast  Result Date: 11/21/2017 CLINICAL DATA:  67 year old female with LEFT arm and LEFT leg weakness for 4 days. History of lung cancer. EXAM: CT HEAD WITHOUT CONTRAST CT CERVICAL SPINE WITHOUT CONTRAST TECHNIQUE: Multidetector CT imaging of the head and cervical spine was performed following the standard protocol without intravenous contrast. Multiplanar CT image reconstructions of the cervical spine were also generated. COMPARISON:  10/03/2017 PET CT FINDINGS: CT HEAD FINDINGS Brain: A 1 cm x 2.5 cm RIGHT frontoparietal hyperdensity/mass with moderate adjacent vasogenic edema is noted likely representing malignancy/metastasis. There is no evidence of acute infarct, definite hemorrhage, extra-axial collection, midline shift or hydrocephalus. Atrophy and mild chronic small-vessel white matter ischemic changes are noted. Vascular: Atherosclerotic calcifications noted. Skull: Normal. Negative for fracture or focal lesion. Sinuses/Orbits: No acute finding. Other: None. CT CERVICAL SPINE FINDINGS Alignment: Normal. Skull base and vertebrae: No acute fracture. No definite bone lesion or focal pathologic process. Soft tissues and spinal canal: No prevertebral fluid or swelling. No visible canal hematoma. Disc levels: Moderate multilevel degenerative disc disease/spondylosis again noted. Upper chest: A 6 mm RIGHT apical ground-glass nodule is unchanged. No acute abnormality. Other: None IMPRESSION: 1. 1 x 2.5 cm RIGHT frontoparietal hyperdensity/mass with adjacent vasogenic edema highly suspicious for malignancy/metastasis. No midline shift, hydrocephalus or gross hemorrhage. MRI of the brain with and without contrast is recommended for further evaluation. 2. Atrophy and chronic small-vessel white matter ischemic changes. 3. Moderate multilevel degenerative changes within the  cervical spine without acute abnormality. 4. Unchanged 6 mm RIGHT apical ground-glass nodule. Electronically Signed   By: Margarette Canada M.D.   On: 11/21/2017 10:08   Ct Cervical Spine Wo Contrast  Result Date: 11/21/2017 CLINICAL DATA:  67 year old female with LEFT arm and LEFT leg weakness for 4 days. History of lung cancer. EXAM: CT HEAD WITHOUT CONTRAST CT CERVICAL SPINE WITHOUT CONTRAST TECHNIQUE: Multidetector CT imaging of the head and cervical spine was performed following the standard protocol without intravenous contrast. Multiplanar CT image reconstructions of the cervical spine were also generated. COMPARISON:  10/03/2017 PET CT FINDINGS: CT HEAD FINDINGS Brain: A 1 cm x 2.5 cm RIGHT frontoparietal hyperdensity/mass with moderate adjacent vasogenic edema is noted likely representing malignancy/metastasis. There is no evidence of acute infarct, definite hemorrhage, extra-axial collection, midline shift or hydrocephalus. Atrophy and mild chronic small-vessel white matter ischemic changes are noted. Vascular: Atherosclerotic calcifications noted. Skull: Normal. Negative for fracture or focal lesion. Sinuses/Orbits: No acute finding. Other: None. CT CERVICAL SPINE FINDINGS Alignment: Normal. Skull base and vertebrae: No acute fracture. No definite bone lesion or focal pathologic process. Soft tissues and spinal canal: No prevertebral fluid or swelling. No visible canal hematoma. Disc levels: Moderate multilevel degenerative disc disease/spondylosis again noted. Upper chest: A 6 mm RIGHT apical ground-glass nodule is unchanged. No acute abnormality. Other: None IMPRESSION: 1. 1 x 2.5 cm RIGHT frontoparietal hyperdensity/mass with adjacent vasogenic edema highly suspicious for malignancy/metastasis. No midline shift, hydrocephalus or gross hemorrhage. MRI of the brain with and without contrast is recommended for further evaluation. 2. Atrophy and chronic small-vessel white matter ischemic changes. 3.  Moderate multilevel degenerative changes within the cervical spine without acute abnormality. 4. Unchanged 6 mm RIGHT apical ground-glass nodule. Electronically Signed  By: Margarette Canada M.D.   On: 11/21/2017 10:08   Mr Brain Wo Contrast  Result Date: 11/21/2017 CLINICAL DATA:  Left arm and leg weakness, 4 days duration. Left lower lobe lung mass. History of renal cell cancer. EXAM: MRI HEAD WITHOUT CONTRAST TECHNIQUE: Multiplanar, multiecho pulse sequences of the brain and surrounding structures were obtained without intravenous contrast. COMPARISON:  CT 11/21/2017 FINDINGS: Brain: Study suffers from motion degradation. The patient would not allow contrast administration. There is a 12 mm mass at the right frontoparietal vertex with surrounding vasogenic edema. Given the clinical history, this most likely represents a metastasis. No second lesion is identified, though sensitivity is diminished in the absence of contrast. If the patient can be convinced, contrast-enhanced imaging would be useful. Elsewhere, there are mild chronic small-vessel changes of the white matter. No hydrocephalus or extra-axial collection. No hemorrhage. Vascular: Major vessels at the base of the brain show flow. Skull and upper cervical spine: Negative Sinuses/Orbits: Clear/normal Other: None IMPRESSION: 12 mm mass at the right frontoparietal vertex with pronounced regional vasogenic edema. This is most likely a metastasis. No second lesion is identified. However, the patient would not allow contrast administration, and tiny other lesions are not excluded at this point. Electronically Signed   By: Nelson Chimes M.D.   On: 11/21/2017 11:43    Medications: I have reviewed the patient's current medications.  Assessment/Plan:  1. Left lung mass-CT biopsy 10/10/2017 revealed metastatic adenocarcinoma, focal CDX-2 positivity, TTF-1 Napsin A, and cytokeratin 20 negative.  Positive for cytokeratin 7 and PAX 8 (patchy week)  PD-L1 score  0%, MSS, tumor mutation burden 8, BRIP1, no KRAS , BRAF, or NRAS mutation  SBRT to left lung mass beginning 11/01/2017, completed 11/11/2017  Left-sided weakness 11/21/2017, CT/MRI-isolated right frontal metastasis with surrounding edema 2. PET scan 10/03/2017-hypermetabolic 3 similar left lower lobe mass, low level metabolic activity associated with bibasilar nodular and bandlike densities felt to be inflammatory, high activity in the tongue and neck musculature 3. COPD 4. Pancreatitis 5. Chronic pain syndrome 6. Left renal cell carcinoma treated with RFA in 2009 and cryoablation in 2012 7. Remote history of heavy alcohol use 8. Ongoing tobacco use 9. History of a partial gastrectomy 10. Anemia   Ms. Allnutt recent completed SBRT treatment of a left lung mass.  It was unclear whether the mass represented a primary lung tumor or metastasis.  The immunohistochemical profile on the lung mass biopsy was suggestive of colorectal cancer.  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  Dr. Paulita Fujita has been consulted to consider a colonoscopy to rule out a diagnosis of colon cancer.  She has chronic pain and is followed at the pain clinic.  The pain is felt to be unrelated to the cancer diagnosis.  Recommendations: 1.  Continue Decadron 2.  Radiation to the brain metastasis as recommended by radiation oncology, doubt she will be a candidate for resection of the frontal brain mass 3.  Narcotic pain regimen as prescribed by the pain clinic 4.  Outpatient follow-up as scheduled in Medical oncology   LOS: 1 day   Betsy Coder, MD   11/22/2017, 9:53 AM

## 2017-11-22 NOTE — Progress Notes (Signed)
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  CT /MRI imaging done in the emergency department showed 12 mm mass at the right frontoparietal vertex with surrounding edema suggestive of metastatic lesion.  Radiation oncology, oncology following.  Plan for 3T MRI today at home.  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  Radiation oncology consulted.  Plan for 3T MRI today.  Radiation oncology planning for stereotactic radiation therapy.  Continue on dexamethasone.  Adenocarcinoma of unknown primary: Has solitary left lung metastasis.  Could not rule out primary lung cancer.  She just completed radiation therapy.  Follows with oncology, Dr. Blair Promise. She is also planned for colonoscopy likely as an outpatient.  She will be followed by Dr. Paulita Fujita.  Chronic pancreatitis: Complains of chronic abdominal pain.  Lipase level normal.  Hypertension: Currently blood pressure stable.  Will continue current regimen  Severe protein calorie malnutrition: Continue nutrition supplements.  Nutrition consulted  COPD: Active tobacco use.  COPD is currently stable.   Decreased air entry bilaterally.  Continue current treatment.Denied  nicotine patch.  Opioid dependence/history of alcohol abuse: Persistently repeatedly asked  for pain medication.  On Suboxone and and as  needed Percocet.  Patient has been told by several providers including me that we would not  want to escalate the pain management than current treatment.    DVT prophylaxis:Lovenox Code Status: Full Family Communication: Husband present at the bedside Disposition Plan: Home after full work-up   Consultants: Oncology, radiation oncology  Procedures: None  Antimicrobials: None  Subjective: Patient seen and examined the bedside this morning.  She has several complaints.  She complains of pain all over her body.  Persistently asked for pain medication.  She was threatening to go home [place]  Objective: Vitals:   11/21/17 1430 11/21/17 1616 11/21/17 2131 11/22/17 0530  BP: 118/89 (!) 105/56 129/68 116/89  Pulse:  77 99 93  Resp:   18 18  Temp:   98.3 F (36.8 C) 98.4 F (36.9 C)  TempSrc:   Oral Oral  SpO2:  100% 100% 100%    Intake/Output Summary (Last 24 hours) at 11/22/2017 1318 Last data filed at 11/22/2017 0900 Gross per 24 hour  Intake 717.41 ml  Output -  Net 717.41 ml   There were no vitals filed for this visit.  Examination:  General exam: Thin/cachectic/looks chronically ill, weak HEENT:PERRL,Oral mucosa moist, Ear/Nose normal on gross exam Respiratory system: Bilateral decreased air entry Cardiovascular system: S1 & S2 heard, RRR. No JVD, murmurs, rubs, gallops or clicks. No pedal edema. Gastrointestinal system: Abdomen is nondistended, soft and nontender. No organomegaly or masses felt. Normal bowel sounds heard. Central nervous system: Alert and oriented. No focal neurological deficits.  Extremities: No edema, no clubbing ,no cyanosis, distal peripheral pulses palpable. Skin: No rashes, lesions or ulcers,no icterus ,no pallor    Data  Reviewed: I have personally reviewed following labs and imaging studies  CBC: Recent Labs  Lab 11/21/17 0831 11/21/17 0844  WBC 5.2  --   NEUTROABS 3.8  --   HGB 12.5 13.3  HCT 39.8 39.0  MCV 87.3  --   PLT 238  --    Basic Metabolic Panel: Recent Labs  Lab 11/21/17 0831 11/21/17 0844  NA 135 136  K 4.2 4.2  CL 101 103  CO2 24  --   GLUCOSE 99 94  BUN 21 22  CREATININE 0.92 0.90  CALCIUM 9.8  --   MG 2.2  --    GFR: Estimated Creatinine Clearance: 32.8 mL/min (by C-G formula based on SCr of 0.9 mg/dL). Liver Function Tests: Recent Labs  Lab 11/21/17 0831  AST 19  ALT 12  ALKPHOS 73  BILITOT 0.3  PROT 7.4  ALBUMIN 4.1   Recent Labs  Lab 11/21/17 0831  LIPASE 18   No results for input(s): AMMONIA in the last 168 hours. Coagulation Profile: Recent Labs  Lab 11/21/17 0831  INR 0.94   Cardiac Enzymes: Recent Labs  Lab 11/21/17 0831  CKTOTAL 39   BNP (last 3 results) No results for input(s): PROBNP in the last 8760 hours. HbA1C: No results for input(s): HGBA1C in the last 72 hours. CBG: No results for input(s): GLUCAP in the last 168 hours. Lipid Profile: No results for input(s): CHOL, HDL, LDLCALC, TRIG, CHOLHDL, LDLDIRECT in the last 72 hours. Thyroid Function Tests: No results for input(s): TSH, T4TOTAL, FREET4, T3FREE, THYROIDAB in the last 72 hours. Anemia Panel: No results for input(s): VITAMINB12, FOLATE, FERRITIN, TIBC, IRON, RETICCTPCT in the last 72 hours. Sepsis Labs: No results for input(s): PROCALCITON, LATICACIDVEN in the last 168 hours.  No results found for this or any previous visit (from the past 240 hour(s)).       Radiology Studies: Ct Abdomen Pelvis Wo Contrast  Result Date: 11/21/2017 CLINICAL DATA:  Abdominal pain.  Patient refused IV contrast. EXAM: CT ABDOMEN AND PELVIS WITHOUT CONTRAST TECHNIQUE: Multidetector CT imaging of the abdomen and pelvis was performed following the standard protocol without IV contrast.  COMPARISON:  08/30/2017, 10/10/2017 FINDINGS: Lower chest: Mild bibasilar scarring. Hepatobiliary: No focal liver abnormality is seen. No gallstones, gallbladder wall thickening. Persistent stable dilatation of the common bile duct unchanged compared with multiple prior exams. Pancreas: Unremarkable. No pancreatic ductal dilatation or surrounding inflammatory changes. Spleen: Normal in size without focal abnormality. Adrenals/Urinary Tract: Adrenal glands are unremarkable. No urolithiasis or obstructive uropathy. Partially calcified 13 mm left lower pole renal mass consistent with posttreatment changes from prior ablation. Bladder is unremarkable. Stomach/Bowel: Stomach is within normal limits. No evidence of bowel wall thickening, distention, or inflammatory changes. Vascular/Lymphatic: Abdominal aortic atherosclerosis. Normal caliber abdominal aorta. No lymphadenopathy. Reproductive: Status post hysterectomy. No adnexal masses. Other: No abdominal wall hernia or abnormality. No abdominopelvic ascites. No pneumatosis, pneumoperitoneum or portal venous gas. Musculoskeletal: Degenerative disc disease with disc height loss at L2-3 and L5-S1. Minimal retrolisthesis of L2 on L3. Broad-based disc bulges at L2-3, L3-4, L4-5 and L5-S1. Bilateral facet arthropathy at L4-5 and L5-S1. Left foraminal stenosis at L5-S1. IMPRESSION: 1. No acute abdominal or pelvic pathology. 2.  Aortic Atherosclerosis (ICD10-I70.0). 3. Lumbar spine spondylosis. Electronically Signed   By: Kathreen Devoid   On: 11/21/2017 12:42   Dg Chest 2  View  Result Date: 11/21/2017 CLINICAL DATA:  Left-sided weakness and shortness of breath EXAM: CHEST - 2 VIEW COMPARISON:  November 14, 2017 chest radiograph and chest CT October 23, 2017 FINDINGS: Previously noted mass in the medial left lower lobe region is again noted, measuring 3.4 x 2.7 x 3.0 cm. There is no edema or consolidation. The heart size and pulmonary vascular normal. There is aortic  atherosclerosis. No evident bone lesions. IMPRESSION: Persistent mass posteromedial left lower lobe region. No new opacity evident. No edema or consolidation. Stable cardiac silhouette. There is aortic atherosclerosis. No evident adenopathy. Aortic Atherosclerosis (ICD10-I70.0). Electronically Signed   By: Lowella Grip III M.D.   On: 11/21/2017 08:19   Ct Head Wo Contrast  Result Date: 11/21/2017 CLINICAL DATA:  67 year old female with LEFT arm and LEFT leg weakness for 4 days. History of lung cancer. EXAM: CT HEAD WITHOUT CONTRAST CT CERVICAL SPINE WITHOUT CONTRAST TECHNIQUE: Multidetector CT imaging of the head and cervical spine was performed following the standard protocol without intravenous contrast. Multiplanar CT image reconstructions of the cervical spine were also generated. COMPARISON:  10/03/2017 PET CT FINDINGS: CT HEAD FINDINGS Brain: A 1 cm x 2.5 cm RIGHT frontoparietal hyperdensity/mass with moderate adjacent vasogenic edema is noted likely representing malignancy/metastasis. There is no evidence of acute infarct, definite hemorrhage, extra-axial collection, midline shift or hydrocephalus. Atrophy and mild chronic small-vessel white matter ischemic changes are noted. Vascular: Atherosclerotic calcifications noted. Skull: Normal. Negative for fracture or focal lesion. Sinuses/Orbits: No acute finding. Other: None. CT CERVICAL SPINE FINDINGS Alignment: Normal. Skull base and vertebrae: No acute fracture. No definite bone lesion or focal pathologic process. Soft tissues and spinal canal: No prevertebral fluid or swelling. No visible canal hematoma. Disc levels: Moderate multilevel degenerative disc disease/spondylosis again noted. Upper chest: A 6 mm RIGHT apical ground-glass nodule is unchanged. No acute abnormality. Other: None IMPRESSION: 1. 1 x 2.5 cm RIGHT frontoparietal hyperdensity/mass with adjacent vasogenic edema highly suspicious for malignancy/metastasis. No midline shift,  hydrocephalus or gross hemorrhage. MRI of the brain with and without contrast is recommended for further evaluation. 2. Atrophy and chronic small-vessel white matter ischemic changes. 3. Moderate multilevel degenerative changes within the cervical spine without acute abnormality. 4. Unchanged 6 mm RIGHT apical ground-glass nodule. Electronically Signed   By: Margarette Canada M.D.   On: 11/21/2017 10:08   Ct Cervical Spine Wo Contrast  Result Date: 11/21/2017 CLINICAL DATA:  67 year old female with LEFT arm and LEFT leg weakness for 4 days. History of lung cancer. EXAM: CT HEAD WITHOUT CONTRAST CT CERVICAL SPINE WITHOUT CONTRAST TECHNIQUE: Multidetector CT imaging of the head and cervical spine was performed following the standard protocol without intravenous contrast. Multiplanar CT image reconstructions of the cervical spine were also generated. COMPARISON:  10/03/2017 PET CT FINDINGS: CT HEAD FINDINGS Brain: A 1 cm x 2.5 cm RIGHT frontoparietal hyperdensity/mass with moderate adjacent vasogenic edema is noted likely representing malignancy/metastasis. There is no evidence of acute infarct, definite hemorrhage, extra-axial collection, midline shift or hydrocephalus. Atrophy and mild chronic small-vessel white matter ischemic changes are noted. Vascular: Atherosclerotic calcifications noted. Skull: Normal. Negative for fracture or focal lesion. Sinuses/Orbits: No acute finding. Other: None. CT CERVICAL SPINE FINDINGS Alignment: Normal. Skull base and vertebrae: No acute fracture. No definite bone lesion or focal pathologic process. Soft tissues and spinal canal: No prevertebral fluid or swelling. No visible canal hematoma. Disc levels: Moderate multilevel degenerative disc disease/spondylosis again noted. Upper chest: A 6 mm RIGHT apical ground-glass nodule is unchanged.  No acute abnormality. Other: None IMPRESSION: 1. 1 x 2.5 cm RIGHT frontoparietal hyperdensity/mass with adjacent vasogenic edema highly suspicious  for malignancy/metastasis. No midline shift, hydrocephalus or gross hemorrhage. MRI of the brain with and without contrast is recommended for further evaluation. 2. Atrophy and chronic small-vessel white matter ischemic changes. 3. Moderate multilevel degenerative changes within the cervical spine without acute abnormality. 4. Unchanged 6 mm RIGHT apical ground-glass nodule. Electronically Signed   By: Margarette Canada M.D.   On: 11/21/2017 10:08   Mr Brain Wo Contrast  Result Date: 11/21/2017 CLINICAL DATA:  Left arm and leg weakness, 4 days duration. Left lower lobe lung mass. History of renal cell cancer. EXAM: MRI HEAD WITHOUT CONTRAST TECHNIQUE: Multiplanar, multiecho pulse sequences of the brain and surrounding structures were obtained without intravenous contrast. COMPARISON:  CT 11/21/2017 FINDINGS: Brain: Study suffers from motion degradation. The patient would not allow contrast administration. There is a 12 mm mass at the right frontoparietal vertex with surrounding vasogenic edema. Given the clinical history, this most likely represents a metastasis. No second lesion is identified, though sensitivity is diminished in the absence of contrast. If the patient can be convinced, contrast-enhanced imaging would be useful. Elsewhere, there are mild chronic small-vessel changes of the white matter. No hydrocephalus or extra-axial collection. No hemorrhage. Vascular: Major vessels at the base of the brain show flow. Skull and upper cervical spine: Negative Sinuses/Orbits: Clear/normal Other: None IMPRESSION: 12 mm mass at the right frontoparietal vertex with pronounced regional vasogenic edema. This is most likely a metastasis. No second lesion is identified. However, the patient would not allow contrast administration, and tiny other lesions are not excluded at this point. Electronically Signed   By: Nelson Chimes M.D.   On: 11/21/2017 11:43        Scheduled Meds: . buprenorphine-naloxone  1 tablet  Sublingual Daily  . dexamethasone  4 mg Oral Q12H  . enoxaparin (LOVENOX) injection  30 mg Subcutaneous Q24H  . feeding supplement (ENSURE ENLIVE)  237 mL Oral TID BM  . megestrol  20 mg Oral Daily  . mirtazapine  15 mg Oral Daily  . mometasone-formoterol  2 puff Inhalation BID  . multivitamin with minerals  1 tablet Oral Daily  . sodium chloride flush  10-40 mL Intracatheter Q12H  . umeclidinium bromide  1 puff Inhalation Daily   Continuous Infusions:   LOS: 1 day    Time spent: 35 mins.More than 50% of that time was spent in counseling and/or coordination of care.      Shelly Coss, MD [facility] Pager [phone]  If 7PM-7AM, please contact night-coverage [email or web address] Password [email or web address] 11/22/2017, 1:18 PM

## 2017-11-22 NOTE — Progress Notes (Signed)
Initial Nutrition Assessment  DOCUMENTATION CODES:   Severe malnutrition in context of chronic illness, Underweight  INTERVENTION:   -Continue Ensure Enlive po TID, each supplement provides 350 kcal and 20 grams of protein -Provide Multivitamin with minerals daily  NUTRITION DIAGNOSIS:   Severe Malnutrition related to chronic illness, cancer and cancer related treatments as evidenced by severe fat depletion, severe muscle depletion, energy intake < or equal to 75% for > or equal to 1 month.  GOAL:   Patient will meet greater than or equal to 90% of their needs  MONITOR:   PO intake, Supplement acceptance, Labs, Weight trends, I & O's  REASON FOR ASSESSMENT:   Consult Assessment of nutrition requirement/status  ASSESSMENT:    67 y.o. 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   Patient with newly developed right hemiplegia from cancer mets. Pt continues to eat meals, completing 100% of breakfast this morning. Will order Ensure supplements for additional kcal and protein.   Per weight records, pt's weight has fluctuated between 61 lb and 84 lb.   Labs reviewed. Medications: Megace tablet daily, Remeron tablet daily  NUTRITION - FOCUSED PHYSICAL EXAM:    Most Recent Value  Orbital Region  Severe depletion  Upper Arm Region  Severe depletion  Thoracic and Lumbar Region  Unable to assess  Buccal Region  Severe depletion  Temple Region  Severe depletion  Clavicle Bone Region  Severe depletion  Clavicle and Acromion Bone Region  Severe depletion  Scapular Bone Region  Severe depletion  Dorsal Hand   Severe depletion  Patellar Region  Severe depletion  Anterior Thigh Region  Severe depletion  Posterior Calf Region  Severe depletion  Edema (RD Assessment)  None       Diet Order:   Diet Order            Diet regular Room service appropriate? Yes; Fluid consistency: Thin  Diet effective now              EDUCATION NEEDS:   No education needs have been identified at this time  Skin:  Skin Assessment: Reviewed RN Assessment  Last BM:  11/18  Height:   Ht Readings from Last 1 Encounters:  11/09/17 5\' 4"  (1.626 m)    Weight:   Wt Readings from Last 1 Encounters:  11/09/17 32.9 kg    Ideal Body Weight:  54.5 kg  BMI:  12.4 kg/m^2  Estimated Nutritional Needs:   Kcal:  1450-1650  Protein:  65-75g  Fluid:  1.5L/day   Clayton Bibles, MS, RD, LDN [place] [facility] Pager: [phone] After Hours Pager: [phone]

## 2017-11-23 ENCOUNTER — Telehealth: Payer: Self-pay | Admitting: Radiation Therapy

## 2017-11-23 ENCOUNTER — Other Ambulatory Visit: Payer: Self-pay | Admitting: Radiation Oncology

## 2017-11-23 DIAGNOSIS — C7931 Secondary malignant neoplasm of brain: Secondary | ICD-10-CM

## 2017-11-23 DIAGNOSIS — C3432 Malignant neoplasm of lower lobe, left bronchus or lung: Secondary | ICD-10-CM

## 2017-11-23 MED ORDER — POLYETHYLENE GLYCOL 3350 17 G PO PACK: 17.0000 g | PACK | Freq: Every day | ORAL | 0 refills | Status: AC | PRN

## 2017-11-23 MED ORDER — DEXAMETHASONE 4 MG PO TABS: 4.0000 mg | ORAL_TABLET | Freq: Two times a day (BID) | ORAL | 0 refills | Status: DC

## 2017-11-23 NOTE — Progress Notes (Signed)
Spoke to spouse(Mike Fennema c#[phone]) directly on floor about spouse [place], [place] has already d/c home.Spouse states she cannot walk. PT recc SNF.spouse states patient is on suboxone therefore the facilities have been unable to accept her in the past. He states xrt oncology nurses told him they would put in [facility] orders for HHPT, & hospital bed.I have left message w/Susan Boyles [phone] to discuss reccommendations await call back. Nurse for patient has already paged Dr. Tawanna Solo for HHPT,3n1,hospital bed order. I have spoken to [facility] who the spouse wants since they already have home 02,neb machine from [facility] rep Santiago Glad is aware of HHPT recc-she was not @ a computer to confirm if able to accept. We are waiting on [place] orders from attending-HHPT,hospital bed,& 3n1order-Nurse Jocelyn Lamer will f/u with dr on the orders. CM will f/u.

## 2017-11-23 NOTE — Progress Notes (Signed)
RN paged IV team to consult on pt midline.  Midline was giving minimal blood return, 2 RNs attempt to get blood return for labs. IV team RN had no additional interventions for midline.  Pt refused venipuncture to obtain morning labs.  11/23/2017 Nedra Hai, RN

## 2017-11-23 NOTE — Progress Notes (Signed)
[name] is a well-known patient to our service with a history of renal cell carcinoma who underwent radiofrequency ablation in 2009 and cryoablation in 2012.  She had a emergency room evaluation and during that timeframe a CT scan revealed a 3 cm left lower lobe mass that had not been seen in January 2019.  Pet imaging revealed activity within the site, and ablation effects were noted in the left kidney consistent with her prior treatment.  A biopsy of the lesion in the left lower lobe was felt to be an adenocarcinoma favor colorectal primary, though this is still under discussion and somewhat in debate.  She received stereotactic body radiotherapy to the site.  She was admitted however after experiencing mental status changes and left-sided weakness.  CT was negative for stroke, but did reveal a mass in the right frontoparietal vertex with surrounding edema.  She underwent an MRI scan on 11/18 which measured the solitary mass is 12 mm in the right frontoparietal vertex with pronounced regional vasogenic edema.  She was started on dexamethasone, and also underwent additional CT imaging of the abdomen and pelvis.  No acute abdominal or pelvic pathology was noted.  She has lumbar spine spondylosis, and aortic atherosclerotic changes.  Her case was discussed with Dr. Benay Spice, her primary oncologist, and she appeared to be a possible candidate for stereotactic radiosurgery.  She underwent a 3T MRI scan of the brain yesterday which confirms a solitary lesion in the brain in the right frontoparietal region near the vertex measuring 2.9 x 1.2 cm with unchanged extensive surrounding vasogenic edema.  I stop by with her brain oncology navigator as well, we discussed that she appears to be a candidate for stereotactic radiosurgery.  Dr. Lisbeth Renshaw has reviewed her films, and would recommend proceeding with a single fraction of SRS.  He is doubtful that her performance status would allow for surgical resection of her tumor,  she will also meet with Dr. Venetia Constable in neurosurgery to confirm this, and we will coordinate for this to occur within the next week.  We will plan for simulation on 12/05/2017.  We outlined the risks, benefits, short and long-term effects of radiotherapy as well as the delivery and logistics which differ from her prior treatment.  I showed her a mask that would be similar to it would be made during her simulation. Written consent is obtained and placed in the chart, a copy was provided to the patient.    Carola Rhine, PAC

## 2017-11-23 NOTE — Care Management Note (Signed)
Case Management Note  Patient Details  Name: [name] MRN: [id number] Date of Birth: 1950-09-28  Subjective/Objective: Per nursing patient, & family adamantly ready to leave-noted no HHC orders. D/c home.                    Action/Plan:dc home.   Expected Discharge Date:  11/23/17               Expected Discharge Plan:  Home/Self Care  In-House Referral:     Discharge planning Services  CM Consult  Post Acute Care Choice:    Choice offered to:     DME Arranged:    DME Agency:     HH Arranged:    HH Agency:     Status of Service:  Completed, signed off  If discussed at [employer] of Stay Meetings, dates discussed:    Additional Comments:  Dessa Phi, RN 11/23/2017, 12:22 PM

## 2017-11-23 NOTE — Telephone Encounter (Signed)
Called to share [name]'s upcoming radiation planning appointments with her husband [name]. Before any information could be shared he informed me that he feels that his wife should not have been discharged home, but should have been placed in a rehabilitation center instead. He said that he was not aware of how weak she is on the left side, and how she cannot walk by herself until after getting her home and trying to get her into their house. He is very worried that she will fall and hurt herself if not placed in a skilled rehab center.   I shared his information with the case manager, Juliann Pulse - phone # [phone]. She will communicate this concern to the IP nurse and discharging MD team requesting that the MD call the patient's husband directly.    Mont Dutton R.T.(R)(T) Special Procedures Navigator  [phone]

## 2017-11-23 NOTE — Discharge Summary (Signed)
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  CT /MRI imaging done in the emergency department showed 12 mm mass at the right frontoparietal vertex with surrounding edema suggestive of metastatic lesion.  Radiation oncology, oncology were following.  She underwent 3T MRI today which showed single 2.9 cm right frontoparietal mass likely representing a metastasis.She has been started on Decadron.  She is stable for discharge to home today.  She will follow-up with radiation oncology as outpatient.  Following problems were addressed during her hospitalization:   Solitary brain mass/metastatic lesion: As per CT/MRI findings .  Radiation oncology consulted.  Underwent  3T MRI today.  Radiation oncology planning for stereotactic radiation therapy.  Continue on dexamethasone.  Adenocarcinoma of unknown primary: Has solitary left lung metastasis.  Could not rule out primary lung cancer.  She just completed radiation therapy.  Follows with oncology, Dr. Blair Promise. She is also planned for colonoscopy likely as an outpatient.  She will be followed by Dr. Paulita Fujita.  Chronic pancreatitis: Complains of chronic abdominal pain.  Lipase level normal.  Hypertension: Currently blood pressure stable.  Will continue current regimen  Severe protein calorie malnutrition:  Continue nutrition supplements.  Nutrition consulted  COPD: Active tobacco use.  COPD is currently stable.  Decreased air entry bilaterally.  Continue current treatment.Denied  nicotine patch.  Opioid dependence/history of alcohol abuse: Persistently repeatedly asked  for pain medication.  On Suboxone and and as  needed Percocet.  Patient has been told by several providers including me that we would not  want to escalate the pain management than current treatment.  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 2)Follow up with your PCP,radiation oncology and oncology.   Increase activity slowly   Complete by:  As directed      Allergies as of 11/23/2017      Reactions   Aspirin Other (See Comments)   Break out in welts   Chlorpromazine Hcl Other (See Comments)   Muscle spasms - "really bad"      Medication List    TAKE these medications   dexamethasone 4 MG tablet Commonly known as:  DECADRON Take 1 tablet (4 mg total) by mouth every 12 (twelve) hours.   DULERA 200-5 MCG/ACT Aero Generic drug:  mometasone-formoterol Inhale 2 puffs into the lungs 2 (two) times daily.   famotidine 20 MG tablet Commonly known as:  PEPCID Take 1 tablet (20 mg total)  by mouth daily. What changed:    when to take this  reasons to take this   feeding supplement (ENSURE ENLIVE) Liqd Take 237 mLs by mouth 3 (three) times daily between meals.    ipratropium-albuterol 0.5-2.5 (3) MG/3ML Soln Commonly known as:  DUONEB Take 3 mLs by nebulization every 6 (six) hours as needed. Use 3 times daily x 4 days, then every 6 hours as needed.   LORazepam 1 MG tablet Commonly known as:  ATIVAN Take 1 tablet (1 mg total) by mouth every 8 (eight) hours as needed for anxiety.   losartan 50 MG tablet Commonly known as:  COZAAR Take 50 mg by mouth daily.   megestrol 20 MG tablet Commonly known as:  MEGACE Take 1 tablet (20 mg total) by mouth daily.   mirtazapine 15 MG tablet Commonly known as:  REMERON Take 15 mg by mouth daily.   ondansetron 4 MG tablet Commonly known as:  ZOFRAN Take 1 tablet (4 mg total) by mouth daily as needed.   oxyCODONE-acetaminophen 5-325 MG tablet Commonly known as:  PERCOCET/ROXICET Take 1-2 tablets by mouth every 6 (six) hours as needed.   polyethylene glycol packet Commonly known as:  MIRALAX / GLYCOLAX Take 17 g by mouth daily as needed for mild constipation.   PROAIR HFA 108 (90 Base) MCG/ACT inhaler Generic drug:  albuterol Inhale 1 puff into the lungs every 4 (four) hours as needed for wheezing or shortness of breath.   SUBOXONE 8-2 MG Film Generic drug:  Buprenorphine HCl-Naloxone HCl Take 8 mg by mouth daily.   tiotropium 18 MCG inhalation capsule Commonly known as:  SPIRIVA PLACE 1 CAPSULE INTO INHALER AND INHALE DAILY What changed:    how much to take  how to take this  when to take this  additional instructions      Follow-up Information    Azzie Glatter, FNP. Schedule an appointment as soon as possible for a visit in 1 week(s).   Specialty:  Family Medicine Contact information: [address] [place] [address] [phone]          Allergies  Allergen Reactions  . Aspirin Other (See Comments)    Break out in welts  . Chlorpromazine Hcl Other (See Comments)    Muscle spasms - "really bad"    Consultations:  Oncology, radiation  oncology   Procedures/Studies: Ct Abdomen Pelvis Wo Contrast  Result Date: 11/21/2017 CLINICAL DATA:  Abdominal pain.  Patient refused IV contrast. EXAM: CT ABDOMEN AND PELVIS WITHOUT CONTRAST TECHNIQUE: Multidetector CT imaging of the abdomen and pelvis was performed following the standard protocol without IV contrast. COMPARISON:  08/30/2017, 10/10/2017 FINDINGS: Lower chest: Mild bibasilar scarring. Hepatobiliary: No focal liver abnormality is seen. No gallstones, gallbladder wall thickening. Persistent stable dilatation of the common bile duct unchanged compared with multiple prior exams. Pancreas: Unremarkable. No pancreatic ductal dilatation or surrounding inflammatory changes. Spleen: Normal in size without focal abnormality. Adrenals/Urinary Tract: Adrenal glands are unremarkable. No urolithiasis or obstructive uropathy. Partially calcified 13 mm left lower pole renal mass consistent with posttreatment changes from prior ablation. Bladder is unremarkable. Stomach/Bowel: Stomach is within normal limits. No evidence of bowel wall thickening, distention, or inflammatory changes. Vascular/Lymphatic: Abdominal aortic atherosclerosis. Normal caliber abdominal aorta. No lymphadenopathy. Reproductive: Status post hysterectomy. No adnexal masses. Other: No abdominal wall hernia or abnormality. No abdominopelvic ascites. No pneumatosis, pneumoperitoneum or portal venous gas. Musculoskeletal: Degenerative disc disease with disc height loss at L2-3 and L5-S1. Minimal retrolisthesis of L2  on L3. Broad-based disc bulges at L2-3, L3-4, L4-5 and L5-S1. Bilateral facet arthropathy at L4-5 and L5-S1. Left foraminal stenosis at L5-S1. IMPRESSION: 1. No acute abdominal or pelvic pathology. 2.  Aortic Atherosclerosis (ICD10-I70.0). 3. Lumbar spine spondylosis. Electronically Signed   By: Kathreen Devoid   On: 11/21/2017 12:42   Dg Chest 2 View  Result Date: 11/21/2017 CLINICAL DATA:  Left-sided weakness and shortness of  breath EXAM: CHEST - 2 VIEW COMPARISON:  November 14, 2017 chest radiograph and chest CT October 23, 2017 FINDINGS: Previously noted mass in the medial left lower lobe region is again noted, measuring 3.4 x 2.7 x 3.0 cm. There is no edema or consolidation. The heart size and pulmonary vascular normal. There is aortic atherosclerosis. No evident bone lesions. IMPRESSION: Persistent mass posteromedial left lower lobe region. No new opacity evident. No edema or consolidation. Stable cardiac silhouette. There is aortic atherosclerosis. No evident adenopathy. Aortic Atherosclerosis (ICD10-I70.0). Electronically Signed   By: Lowella Grip III M.D.   On: 11/21/2017 08:19   Dg Chest 2 View  Result Date: 11/14/2017 CLINICAL DATA:  Shortness of breath. EXAM: CHEST - 2 VIEW COMPARISON:  Radiographs and CT scan of October 23, 2017. FINDINGS: The heart size and mediastinal contours are within normal limits. Hyperinflation of the lungs is noted. No pneumothorax or pleural effusion is noted. Right lung is clear. Stable appearance of rounded mass seen medially in left lower lobe as described on prior radiograph and CT scan. The visualized skeletal structures are unremarkable. IMPRESSION: Stable left lower lobe mass is noted concerning for neoplasm. Hyperinflation of the lungs. No other abnormality seen in the chest. Electronically Signed   By: Marijo Conception, M.D.   On: 11/14/2017 12:52   Ct Head Wo Contrast  Result Date: 11/21/2017 CLINICAL DATA:  67 year old female with LEFT arm and LEFT leg weakness for 4 days. History of lung cancer. EXAM: CT HEAD WITHOUT CONTRAST CT CERVICAL SPINE WITHOUT CONTRAST TECHNIQUE: Multidetector CT imaging of the head and cervical spine was performed following the standard protocol without intravenous contrast. Multiplanar CT image reconstructions of the cervical spine were also generated. COMPARISON:  10/03/2017 PET CT FINDINGS: CT HEAD FINDINGS Brain: A 1 cm x 2.5 cm RIGHT  frontoparietal hyperdensity/mass with moderate adjacent vasogenic edema is noted likely representing malignancy/metastasis. There is no evidence of acute infarct, definite hemorrhage, extra-axial collection, midline shift or hydrocephalus. Atrophy and mild chronic small-vessel white matter ischemic changes are noted. Vascular: Atherosclerotic calcifications noted. Skull: Normal. Negative for fracture or focal lesion. Sinuses/Orbits: No acute finding. Other: None. CT CERVICAL SPINE FINDINGS Alignment: Normal. Skull base and vertebrae: No acute fracture. No definite bone lesion or focal pathologic process. Soft tissues and spinal canal: No prevertebral fluid or swelling. No visible canal hematoma. Disc levels: Moderate multilevel degenerative disc disease/spondylosis again noted. Upper chest: A 6 mm RIGHT apical ground-glass nodule is unchanged. No acute abnormality. Other: None IMPRESSION: 1. 1 x 2.5 cm RIGHT frontoparietal hyperdensity/mass with adjacent vasogenic edema highly suspicious for malignancy/metastasis. No midline shift, hydrocephalus or gross hemorrhage. MRI of the brain with and without contrast is recommended for further evaluation. 2. Atrophy and chronic small-vessel white matter ischemic changes. 3. Moderate multilevel degenerative changes within the cervical spine without acute abnormality. 4. Unchanged 6 mm RIGHT apical ground-glass nodule. Electronically Signed   By: Margarette Canada M.D.   On: 11/21/2017 10:08   Ct Cervical Spine Wo Contrast  Result Date: 11/21/2017 CLINICAL DATA:  67 year old female with LEFT arm  and LEFT leg weakness for 4 days. History of lung cancer. EXAM: CT HEAD WITHOUT CONTRAST CT CERVICAL SPINE WITHOUT CONTRAST TECHNIQUE: Multidetector CT imaging of the head and cervical spine was performed following the standard protocol without intravenous contrast. Multiplanar CT image reconstructions of the cervical spine were also generated. COMPARISON:  10/03/2017 PET CT FINDINGS:  CT HEAD FINDINGS Brain: A 1 cm x 2.5 cm RIGHT frontoparietal hyperdensity/mass with moderate adjacent vasogenic edema is noted likely representing malignancy/metastasis. There is no evidence of acute infarct, definite hemorrhage, extra-axial collection, midline shift or hydrocephalus. Atrophy and mild chronic small-vessel white matter ischemic changes are noted. Vascular: Atherosclerotic calcifications noted. Skull: Normal. Negative for fracture or focal lesion. Sinuses/Orbits: No acute finding. Other: None. CT CERVICAL SPINE FINDINGS Alignment: Normal. Skull base and vertebrae: No acute fracture. No definite bone lesion or focal pathologic process. Soft tissues and spinal canal: No prevertebral fluid or swelling. No visible canal hematoma. Disc levels: Moderate multilevel degenerative disc disease/spondylosis again noted. Upper chest: A 6 mm RIGHT apical ground-glass nodule is unchanged. No acute abnormality. Other: None IMPRESSION: 1. 1 x 2.5 cm RIGHT frontoparietal hyperdensity/mass with adjacent vasogenic edema highly suspicious for malignancy/metastasis. No midline shift, hydrocephalus or gross hemorrhage. MRI of the brain with and without contrast is recommended for further evaluation. 2. Atrophy and chronic small-vessel white matter ischemic changes. 3. Moderate multilevel degenerative changes within the cervical spine without acute abnormality. 4. Unchanged 6 mm RIGHT apical ground-glass nodule. Electronically Signed   By: Margarette Canada M.D.   On: 11/21/2017 10:08   Mr Brain Wo Contrast  Result Date: 11/21/2017 CLINICAL DATA:  Left arm and leg weakness, 4 days duration. Left lower lobe lung mass. History of renal cell cancer. EXAM: MRI HEAD WITHOUT CONTRAST TECHNIQUE: Multiplanar, multiecho pulse sequences of the brain and surrounding structures were obtained without intravenous contrast. COMPARISON:  CT 11/21/2017 FINDINGS: Brain: Study suffers from motion degradation. The patient would not allow  contrast administration. There is a 12 mm mass at the right frontoparietal vertex with surrounding vasogenic edema. Given the clinical history, this most likely represents a metastasis. No second lesion is identified, though sensitivity is diminished in the absence of contrast. If the patient can be convinced, contrast-enhanced imaging would be useful. Elsewhere, there are mild chronic small-vessel changes of the white matter. No hydrocephalus or extra-axial collection. No hemorrhage. Vascular: Major vessels at the base of the brain show flow. Skull and upper cervical spine: Negative Sinuses/Orbits: Clear/normal Other: None IMPRESSION: 12 mm mass at the right frontoparietal vertex with pronounced regional vasogenic edema. This is most likely a metastasis. No second lesion is identified. However, the patient would not allow contrast administration, and tiny other lesions are not excluded at this point. Electronically Signed   By: Nelson Chimes M.D.   On: 11/21/2017 11:43   Mr Jeri Cos FV Contrast  Result Date: 11/22/2017 CLINICAL DATA:  Brain metastasis.  Left lung mass. EXAM: MRI HEAD WITHOUT AND WITH CONTRAST TECHNIQUE: Multiplanar, multiecho pulse sequences of the brain and surrounding structures were obtained without and with intravenous contrast. CONTRAST:  3 mL Gadavist COMPARISON:  Noncontrast brain MRI 11/21/2017 FINDINGS: Brain: A heterogeneously enhancing bilobed mass in the right frontoparietal region near the vertex measures 2.9 x 1.2 cm with unchanged extensive surrounding vasogenic edema. Heterogeneously restricted diffusion within the mass suggests hypercellularity. There is regional sulcal effacement without midline shift. No enhancing lesion is identified elsewhere. There is no evidence of acute infarct, intracranial hemorrhage, or extra-axial fluid collection. Scattered  small foci of T2 hyperintensity in the cerebral white matter bilaterally are nonspecific but compatible with mild chronic small  vessel ischemic disease. There is moderate cerebral atrophy. Vascular: Major intracranial vascular flow voids are preserved. Skull and upper cervical spine: No suspicious marrow lesion. Sinuses/Orbits: Left cataract extraction. Paranasal sinuses and mastoid air cells are clear. Other: None. IMPRESSION: 1. 2.9 cm right frontoparietal mass likely representing a metastasis. 2. No second lesion identified. 3. Mild chronic small vessel ischemic disease and moderate cerebral atrophy. Electronically Signed   By: Logan Bores M.D.   On: 11/22/2017 16:55       Subjective: Patient seen and examined the bedside this morning.  Remains comfortable.  No new active issues.  Stable for discharge to home after discussion with Dr. Learta Codding.  Discharge Exam: Vitals:   11/22/17 2115 11/23/17 0544  BP: 118/89 116/72  Pulse: 92 74  Resp: 18 18  Temp: 98 F (36.7 C) 98.6 F (37 C)  SpO2: 96% 100%   Vitals:   11/22/17 0530 11/22/17 1359 11/22/17 2115 11/23/17 0544  BP: 116/89 (!) 148/89 118/89 116/72  Pulse: 93 72 92 74  Resp: 18 16 18 18   Temp: 98.4 F (36.9 C) 98.9 F (37.2 C) 98 F (36.7 C) 98.6 F (37 C)  TempSrc: Oral Oral Oral Oral  SpO2: 100% 96% 96% 100%    General: Pt is alert, awake, not in acute distress,cachetic Cardiovascular: RRR, S1/S2 +, no rubs, no gallops Respiratory: Bilateral decreased air entry Abdominal: Soft, NT, ND, bowel sounds + Extremities: no edema, no cyanosis    The results of significant diagnostics from this hospitalization (including imaging, microbiology, ancillary and laboratory) are listed below for reference.     Microbiology: No results found for this or any previous visit (from the past 240 hour(s)).   Labs: BNP (last 3 results) No results for input(s): BNP in the last 8760 hours. Basic Metabolic Panel: Recent Labs  Lab 11/21/17 0831 11/21/17 0844  NA 135 136  K 4.2 4.2  CL 101 103  CO2 24  --   GLUCOSE 99 94  BUN 21 22  CREATININE 0.92 0.90   CALCIUM 9.8  --   MG 2.2  --    Liver Function Tests: Recent Labs  Lab 11/21/17 0831  AST 19  ALT 12  ALKPHOS 73  BILITOT 0.3  PROT 7.4  ALBUMIN 4.1   Recent Labs  Lab 11/21/17 0831  LIPASE 18   No results for input(s): AMMONIA in the last 168 hours. CBC: Recent Labs  Lab 11/21/17 0831 11/21/17 0844  WBC 5.2  --   NEUTROABS 3.8  --   HGB 12.5 13.3  HCT 39.8 39.0  MCV 87.3  --   PLT 238  --    Cardiac Enzymes: Recent Labs  Lab 11/21/17 0831  CKTOTAL 39   BNP: Invalid input(s): POCBNP CBG: No results for input(s): GLUCAP in the last 168 hours. D-Dimer No results for input(s): DDIMER in the last 72 hours. Hgb A1c No results for input(s): HGBA1C in the last 72 hours. Lipid Profile No results for input(s): CHOL, HDL, LDLCALC, TRIG, CHOLHDL, LDLDIRECT in the last 72 hours. Thyroid function studies No results for input(s): TSH, T4TOTAL, T3FREE, THYROIDAB in the last 72 hours.  Invalid input(s): FREET3 Anemia work up No results for input(s): VITAMINB12, FOLATE, FERRITIN, TIBC, IRON, RETICCTPCT in the last 72 hours. Urinalysis    Component Value Date/Time   COLORURINE YELLOW 11/21/2017 0745   APPEARANCEUR CLEAR 11/21/2017  0745   LABSPEC 1.014 11/21/2017 0745   PHURINE 6.0 11/21/2017 0745   GLUCOSEU >=500 (A) 11/21/2017 0745   HGBUR MODERATE (A) 11/21/2017 0745   BILIRUBINUR NEGATIVE 11/21/2017 0745   BILIRUBINUR negative 06/03/2017 1120   KETONESUR NEGATIVE 11/21/2017 0745   PROTEINUR NEGATIVE 11/21/2017 0745   UROBILINOGEN 0.2 06/03/2017 1120   UROBILINOGEN 0.2 03/15/2017 1004   NITRITE NEGATIVE 11/21/2017 0745   LEUKOCYTESUR NEGATIVE 11/21/2017 0745   Sepsis Labs Invalid input(s): PROCALCITONIN,  WBC,  LACTICIDVEN Microbiology No results found for this or any previous visit (from the past 240 hour(s)).  Please note: You were cared for by a hospitalist during your hospital stay. Once you are discharged, your primary care physician will handle  any further medical issues. Please note that NO REFILLS for any discharge medications will be authorized once you are discharged, as it is imperative that you return to your primary care physician (or establish a relationship with a primary care physician if you do not have one) for your post hospital discharge needs so that they can reassess your need for medications and monitor your lab values.    Time coordinating discharge: 40 minutes  SIGNED:   Shelly Coss, MD  [facility] 11/23/2017, 10:01 AM Pager [phone]  If 7PM-7AM, please contact night-coverage [email or web address] Password [email or web address]

## 2017-11-23 NOTE — Progress Notes (Signed)
When [name] was told that she was going home she was excited to go home . She was anxious to leave and expresses "unhappiness " that the D/C wasn't going fast enough. Mr [name] is here on [place]  trying to find put why [name] has no home health PT. I have spoken to case manager  [name] is  now. I have paged Dr Sheffield Slider

## 2017-12-05 ENCOUNTER — Ambulatory Visit
Admission: RE | Admit: 2017-12-05 | Discharge: 2017-12-05 | Disposition: A | Discharge status: Home / Self Care | Payer: Medicare Other | Source: Ambulatory Visit | Attending: Radiation Oncology | Admitting: Radiation Oncology

## 2017-12-05 ENCOUNTER — Telehealth: Payer: Self-pay | Admitting: *Deleted

## 2017-12-05 ENCOUNTER — Other Ambulatory Visit: Payer: Self-pay | Admitting: Family Medicine

## 2017-12-05 DIAGNOSIS — Z510 Encounter for antineoplastic radiation therapy: Secondary | ICD-10-CM | POA: Diagnosis not present

## 2017-12-05 DIAGNOSIS — C7931 Secondary malignant neoplasm of brain: Secondary | ICD-10-CM | POA: Diagnosis not present

## 2017-12-05 DIAGNOSIS — C801 Malignant (primary) neoplasm, unspecified: Principal | ICD-10-CM

## 2017-12-05 DIAGNOSIS — G8191 Hemiplegia, unspecified affecting right dominant side: Secondary | ICD-10-CM

## 2017-12-05 MED ORDER — SODIUM CHLORIDE 0.9% FLUSH: 10.0000 mL | Freq: Once | INTRAVENOUS | Status: DC

## 2017-12-05 NOTE — Telephone Encounter (Signed)
Physical therapist w/[facility] asking if Dr. Benay Spice will approve orders for home physical therapy 2/week for 3 weeks and then weekly afterwards. Has attempted to reach PCP for orders without success.

## 2017-12-05 NOTE — Progress Notes (Signed)
Patient's husband requesting wheelchair.

## 2017-12-06 NOTE — Telephone Encounter (Signed)
Informed Cecilie Lowers that Dr. Benay Spice approves home PT as requested and is willing to sign orders.

## 2017-12-07 DIAGNOSIS — Z510 Encounter for antineoplastic radiation therapy: Secondary | ICD-10-CM | POA: Diagnosis not present

## 2017-12-08 ENCOUNTER — Encounter: Payer: Self-pay | Admitting: Radiation Oncology

## 2017-12-08 ENCOUNTER — Ambulatory Visit
Admission: RE | Admit: 2017-12-08 | Discharge: 2017-12-08 | Disposition: A | Discharge status: Home / Self Care | Payer: Medicare Other | Source: Ambulatory Visit | Attending: Radiation Oncology | Admitting: Radiation Oncology

## 2017-12-08 VITALS — BP 114/93 | HR 88 | Temp 98.2°F | Resp 20

## 2017-12-08 DIAGNOSIS — Z510 Encounter for antineoplastic radiation therapy: Secondary | ICD-10-CM | POA: Diagnosis not present

## 2017-12-08 DIAGNOSIS — C801 Malignant (primary) neoplasm, unspecified: Principal | ICD-10-CM

## 2017-12-08 DIAGNOSIS — C7931 Secondary malignant neoplasm of brain: Secondary | ICD-10-CM

## 2017-12-08 NOTE — Progress Notes (Signed)
  Radiation Oncology         [phone] ________________________________  Name: [name] MRN: [id number]  Date: 11/11/2017  DOB: [birth year]  End of Treatment Note  Diagnosis:   67 y.o. female with metastatic colorectal carcinoma presenting with a left lower lobe mass    Indication for treatment:  Curative       Radiation treatment dates:   11/01/2017, 11/03/2017, 11/07/2017, 11/09/2017, 11/11/2017  Site/dose:   The tumor in the LLL was treated with a course of stereotactic body radiation treatment. The patient received 60 Gy in 5 fractions at 12 Gy per fraction.  Beams/energy:   SBRT/SRT-VMAT // 6X-FFF Photon  Narrative: The patient tolerated radiation treatment relatively well.   The patient did not have any signs of acute toxicity during treatment.  Plan: The patient has completed radiation treatment. The patient will return to radiation oncology clinic for routine followup in one month. I advised the patient to call or return sooner if they have any questions or concerns related to their recovery or treatment.   ------------------------------------------------  Jodelle Gross, MD, PhD  This document serves as a record of services personally performed by Kyung Rudd, MD. It was created on his behalf by Rae Lips, a trained medical scribe. The creation of this record is based on the scribe's personal observations and the provider's statements to them. This document has been checked and approved by the attending provider.

## 2017-12-08 NOTE — Progress Notes (Signed)
Patient refused to rest with [place] for the required 30 minutes following her SRS treatment.  Patient was screaming and yelling in the hallway of the clinic.  RN, NT, as well as the PA tried to calm the patient down with no avail.  Patient denies headache, dizziness, nausea, diplopia or ringing in the ears. Denies fatigue.  Patient was wheeled to the waiting room  where her husband was waiting.  He was advised to call with questions or concerns [phone].    BP (!) 114/93   Pulse 88   Temp 98.2 F (36.8 C) (Oral)   Resp 20   SpO2 94%    [name] M. Leonie Green, BSN

## 2017-12-08 NOTE — Progress Notes (Signed)
  Name: [name]  MRN: [id number]  Date: 12/08/2017   DOB: 12/24/1950  Stereotactic Radiosurgery Operative Note  PRE-OPERATIVE DIAGNOSIS:  Metastatic brain tumor  POST-OPERATIVE DIAGNOSIS:  Metastatic brain tumor  PROCEDURE:  Stereotactic Radiosurgery  SURGEON:  Judith Part, MD  NARRATIVE: The patient underwent a radiation treatment planning session in the radiation oncology simulation suite under the care of the radiation oncology physician and physicist.  I participated closely in the radiation treatment planning afterwards. The patient underwent planning CT which was fused to 3T high resolution MRI with 1 mm axial slices.  These images were fused on the planning system.  We contoured the gross target volumes and subsequently expanded this to yield the Planning Target Volume. I actively participated in the planning process.  I helped to define and review the target contours and also the contours of the optic pathway, eyes, brainstem and selected nearby organs at risk.  All the dose constraints for critical structures were reviewed and compared to AAPM Task Group 101.  The prescription dose conformity was reviewed.  I approved the plan electronically.    Accordingly, [name] was brought to the TrueBeam stereotactic radiation treatment linac and placed in the custom immobilization mask.  The patient was aligned according to the IR fiducial markers with BrainLab Exactrac, then orthogonal x-rays were used in ExacTrac with the 6DOF robotic table and the shifts were made to align the patient  [name] received stereotactic radiosurgery uneventfully.    Lesions treated:  1   Complex lesions treated:  0 (>3.5 cm, <81mm of optic path, or within the brainstem)   The detailed description of the procedure is recorded in the radiation oncology procedure note.  I was present for the duration of the procedure.  DISPOSITION:  Following delivery, the patient was transported to  nursing in stable condition and monitored for possible acute effects to be discharged to home in stable condition with follow-up in one month.  Judith Part, MD 12/08/2017 1:03 PM

## 2017-12-09 ENCOUNTER — Inpatient Hospital Stay (HOSPITAL_COMMUNITY)
Admission: EM | Admit: 2017-12-09 | Discharge: 2017-12-13 | DRG: 602 | Disposition: A | Discharge status: Skilled Nursing Facility | Payer: Medicare Other | Attending: Internal Medicine | Admitting: Internal Medicine

## 2017-12-09 ENCOUNTER — Other Ambulatory Visit: Payer: Self-pay

## 2017-12-09 ENCOUNTER — Telehealth: Payer: Self-pay

## 2017-12-09 ENCOUNTER — Emergency Department (HOSPITAL_COMMUNITY): Payer: Medicare Other

## 2017-12-09 ENCOUNTER — Encounter (HOSPITAL_COMMUNITY): Payer: Self-pay

## 2017-12-09 ENCOUNTER — Telehealth: Payer: Self-pay | Admitting: *Deleted

## 2017-12-09 DIAGNOSIS — I10 Essential (primary) hypertension: Secondary | ICD-10-CM | POA: Diagnosis present

## 2017-12-09 DIAGNOSIS — G894 Chronic pain syndrome: Secondary | ICD-10-CM

## 2017-12-09 DIAGNOSIS — F419 Anxiety disorder, unspecified: Secondary | ICD-10-CM | POA: Diagnosis present

## 2017-12-09 DIAGNOSIS — E43 Unspecified severe protein-calorie malnutrition: Secondary | ICD-10-CM | POA: Diagnosis present

## 2017-12-09 DIAGNOSIS — L0390 Cellulitis, unspecified: Secondary | ICD-10-CM | POA: Insufficient documentation

## 2017-12-09 DIAGNOSIS — K219 Gastro-esophageal reflux disease without esophagitis: Secondary | ICD-10-CM | POA: Diagnosis present

## 2017-12-09 DIAGNOSIS — Z85528 Personal history of other malignant neoplasm of kidney: Secondary | ICD-10-CM

## 2017-12-09 DIAGNOSIS — L03116 Cellulitis of left lower limb: Secondary | ICD-10-CM | POA: Diagnosis not present

## 2017-12-09 DIAGNOSIS — K860 Alcohol-induced chronic pancreatitis: Secondary | ICD-10-CM | POA: Diagnosis present

## 2017-12-09 DIAGNOSIS — S91312S Laceration without foreign body, left foot, sequela: Secondary | ICD-10-CM

## 2017-12-09 DIAGNOSIS — J449 Chronic obstructive pulmonary disease, unspecified: Secondary | ICD-10-CM | POA: Diagnosis present

## 2017-12-09 DIAGNOSIS — Z79899 Other long term (current) drug therapy: Secondary | ICD-10-CM

## 2017-12-09 DIAGNOSIS — C7931 Secondary malignant neoplasm of brain: Secondary | ICD-10-CM | POA: Diagnosis present

## 2017-12-09 DIAGNOSIS — Z825 Family history of asthma and other chronic lower respiratory diseases: Secondary | ICD-10-CM

## 2017-12-09 DIAGNOSIS — R531 Weakness: Secondary | ICD-10-CM

## 2017-12-09 DIAGNOSIS — Z886 Allergy status to analgesic agent status: Secondary | ICD-10-CM

## 2017-12-09 DIAGNOSIS — Z66 Do not resuscitate: Secondary | ICD-10-CM

## 2017-12-09 DIAGNOSIS — Z923 Personal history of irradiation: Secondary | ICD-10-CM

## 2017-12-09 DIAGNOSIS — Z8249 Family history of ischemic heart disease and other diseases of the circulatory system: Secondary | ICD-10-CM

## 2017-12-09 DIAGNOSIS — Z515 Encounter for palliative care: Secondary | ICD-10-CM

## 2017-12-09 DIAGNOSIS — Z888 Allergy status to other drugs, medicaments and biological substances status: Secondary | ICD-10-CM

## 2017-12-09 DIAGNOSIS — F17210 Nicotine dependence, cigarettes, uncomplicated: Secondary | ICD-10-CM | POA: Diagnosis present

## 2017-12-09 DIAGNOSIS — Z681 Body mass index (BMI) 19 or less, adult: Secondary | ICD-10-CM

## 2017-12-09 DIAGNOSIS — F1320 Sedative, hypnotic or anxiolytic dependence, uncomplicated: Secondary | ICD-10-CM | POA: Diagnosis present

## 2017-12-09 DIAGNOSIS — R627 Adult failure to thrive: Secondary | ICD-10-CM

## 2017-12-09 DIAGNOSIS — F1120 Opioid dependence, uncomplicated: Secondary | ICD-10-CM | POA: Diagnosis present

## 2017-12-09 DIAGNOSIS — Z801 Family history of malignant neoplasm of trachea, bronchus and lung: Secondary | ICD-10-CM

## 2017-12-09 DIAGNOSIS — Z765 Malingerer [conscious simulation]: Secondary | ICD-10-CM

## 2017-12-09 DIAGNOSIS — C801 Malignant (primary) neoplasm, unspecified: Secondary | ICD-10-CM

## 2017-12-09 DIAGNOSIS — E876 Hypokalemia: Secondary | ICD-10-CM | POA: Diagnosis present

## 2017-12-09 DIAGNOSIS — Z811 Family history of alcohol abuse and dependence: Secondary | ICD-10-CM

## 2017-12-09 DIAGNOSIS — Z903 Acquired absence of stomach [part of]: Secondary | ICD-10-CM

## 2017-12-09 LAB — COMPREHENSIVE METABOLIC PANEL
ALT: 18 U/L (ref 0–44)
AST: 15 U/L (ref 15–41)
Albumin: 3.4 g/dL — ABNORMAL LOW (ref 3.5–5.0)
Alkaline Phosphatase: 59 U/L (ref 38–126)
Anion gap: 10 (ref 5–15)
BUN: 24 mg/dL — ABNORMAL HIGH (ref 8–23)
CALCIUM: 8.8 mg/dL — AB (ref 8.9–10.3)
CO2: 24 mmol/L (ref 22–32)
Chloride: 108 mmol/L (ref 98–111)
Creatinine, Ser: 0.72 mg/dL (ref 0.44–1.00)
GFR calc Af Amer: >60 mL/min (ref >60)
Glucose, Bld: 101 mg/dL — ABNORMAL HIGH (ref 70–99)
Potassium: 2.8 mmol/L — ABNORMAL LOW (ref 3.5–5.1)
Sodium: 142 mmol/L (ref 135–145)
Total Bilirubin: 0.3 mg/dL (ref 0.3–1.2)
Total Protein: 6 g/dL — ABNORMAL LOW (ref 6.5–8.1)

## 2017-12-09 LAB — CBC WITH DIFFERENTIAL/PLATELET
Abs Immature Granulocytes: 0.08 10*3/uL — ABNORMAL HIGH (ref 0.00–0.07)
Basophils Absolute: 0 10*3/uL (ref 0.0–0.1)
Basophils Relative: 0 %
EOS PCT: 1 %
Eosinophils Absolute: 0.2 10*3/uL (ref 0.0–0.5)
HCT: 36.5 % (ref 36.0–46.0)
Hemoglobin: 10.7 g/dL — ABNORMAL LOW (ref 12.0–15.0)
Immature Granulocytes: 1 %
Lymphocytes Relative: 9 %
Lymphs Abs: 1 10*3/uL (ref 0.7–4.0)
MCH: 27.7 pg (ref 26.0–34.0)
MCHC: 29.3 g/dL — ABNORMAL LOW (ref 30.0–36.0)
MCV: 94.6 fL (ref 80.0–100.0)
Monocytes Absolute: 0.6 10*3/uL (ref 0.1–1.0)
Monocytes Relative: 5 %
Neutro Abs: 9.8 10*3/uL — ABNORMAL HIGH (ref 1.7–7.7)
Neutrophils Relative %: 84 %
Platelets: 192 10*3/uL (ref 150–400)
RBC: 3.86 MIL/uL — ABNORMAL LOW (ref 3.87–5.11)
RDW: 17.2 % — ABNORMAL HIGH (ref 11.5–15.5)
WBC: 11.6 10*3/uL — ABNORMAL HIGH (ref 4.0–10.5)
nRBC: 0 % (ref 0.0–0.2)

## 2017-12-09 MED ORDER — OXYCODONE HCL 5 MG PO TABS
5.0000 mg | ORAL_TABLET | Freq: Once | ORAL | Status: AC
  Administered 2017-12-09: 5 mg via ORAL
  Filled 2017-12-09: qty 1

## 2017-12-09 MED ORDER — OXYCODONE-ACETAMINOPHEN 5-325 MG PO TABS
1.0000 | ORAL_TABLET | Freq: Once | ORAL | Status: AC
  Administered 2017-12-09: 1 via ORAL
  Filled 2017-12-09: qty 1

## 2017-12-09 MED ORDER — POTASSIUM CHLORIDE 10 MEQ/100ML IV SOLN: 10.0000 meq | INTRAVENOUS | Status: AC

## 2017-12-09 MED ORDER — SODIUM CHLORIDE 0.9 % IV BOLUS: 500.0000 mL | Freq: Once | INTRAVENOUS | Status: DC

## 2017-12-09 MED ORDER — VANCOMYCIN HCL IN DEXTROSE 1-5 GM/200ML-% IV SOLN: 1000.0000 mg | Freq: Once | INTRAVENOUS | Status: DC

## 2017-12-09 MED ORDER — TETANUS-DIPHTH-ACELL PERTUSSIS 5-2.5-18.5 LF-MCG/0.5 IM SUSP
0.5000 mL | Freq: Once | INTRAMUSCULAR | Status: AC
  Administered 2017-12-09: 0.5 mL via INTRAMUSCULAR
  Filled 2017-12-09: qty 0.5

## 2017-12-09 MED ORDER — CEPHALEXIN 500 MG PO CAPS
500.0000 mg | ORAL_CAPSULE | Freq: Once | ORAL | Status: AC
  Administered 2017-12-09: 500 mg via ORAL
  Filled 2017-12-09: qty 1

## 2017-12-09 NOTE — ED Notes (Signed)
ED TO INPATIENT HANDOFF REPORT  Name/Age/Gender [name] 67 y.o. female  Code Status Code Status History    Date Active Date Inactive Code Status Order ID Comments User Context   11/21/2017 1554 11/23/2017 1413 Full Code [id number]  Annita Brod, MD Inpatient   10/23/2017 2230 10/25/2017 1942 Full Code [id number]  Eugenie Filler, MD Inpatient   09/17/2017 1540 09/24/2017 1724 Full Code [id number]  Nita Sells, MD ED   06/21/2017 2201 06/28/2017 1543 Full Code [id number]  Bethena Roys, MD Inpatient   04/03/2017 1617 04/11/2017 [place] Full Code [id number]  Georgette Shell, MD ED   04/03/2017 1207 04/03/2017 1617 Full Code [id number]  Renita Papa, PA-C ED   02/28/2016 1050 02/29/2016 1439 Full Code [id number]  Annita Brod, MD Inpatient   01/03/2015 1440 01/05/2015 1428 Full Code [id number]  Theodis Blaze, MD Inpatient   07/03/2014 1716 07/05/2014 1540 Full Code [id number]  Cristal Ford, DO Inpatient   06/02/2013 0029 06/06/2013 1456 Full Code [id number]  Etta Quill, DO ED   03/28/2013 1941 04/01/2013 1925 Full Code [id number]  Marybelle Killings, MD Inpatient   03/28/2013 0550 03/28/2013 1941 Full Code [id number]  Berle Mull, MD ED   03/09/2013 2235 03/11/2013 2210 Full Code [id number]  Toy Baker, MD ED   02/09/2013 0247 02/11/2013 1915 Full Code [id number]  Etta Quill, DO ED      Home/SNF/Other Home  Chief Complaint injury to left foot  Level of Care/Admitting Diagnosis ED Disposition    ED Disposition Condition [place]: [facility] [100102]  Level of Care: Telemetry [5]  Admit to tele based on following criteria: Monitor QTC interval  Diagnosis: Cellulitis [741287]  Admitting Physician: Rise Patience [phone]  Attending Physician: Rise Patience [phone]  PT Class (Do Not Modify): Observation [104]  PT Acc Code (Do Not Modify): Observation [10022]       Medical History Past Medical History:   Diagnosis Date  . Cancer ([place])    renal ca  . COPD (chronic obstructive pulmonary disease) ([place])   . Drug-seeking behavior   . Pancreatitis   . Pancreatitis   . Seizures ([place])   . Substance abuse ([facility])     Allergies Allergies  Allergen Reactions  . Aspirin Other (See Comments)    Break out in welts  . Chlorpromazine Hcl Other (See Comments)    Muscle spasms - "really bad"    IV Location/Drains/Wounds Patient Lines/Drains/Airways Status   Active Line/Drains/Airways    Name:   Placement date:   Placement time:   Site:   Days:   Midline Single Lumen 11/22/17 Midline Left Basilic 8 cm 0 cm   47/09/62    8366    Basilic   17   External Urinary Catheter   11/21/17    0826    -   18   Incision (Closed) 10/10/17 Back Left;Lower   10/10/17    1200     60          Labs/Imaging Results for orders placed or performed during the hospital encounter of 12/09/17 (from the past 48 hour(s))  CBC with Differential/Platelet     Status: Abnormal   Collection Time: 12/09/17  7:47 PM  Result Value Ref Range   WBC 11.6 (H) 4.0 - 10.5 K/uL   RBC 3.86 (L) 3.87 - 5.11 MIL/uL   Hemoglobin 10.7 (L) 12.0 - 15.0 g/dL   HCT 36.5 36.0 -  46.0 %   MCV 94.6 80.0 - 100.0 fL   MCH 27.7 26.0 - 34.0 pg   MCHC 29.3 (L) 30.0 - 36.0 g/dL   RDW 17.2 (H) 11.5 - 15.5 %   Platelets 192 150 - 400 K/uL   nRBC 0.0 0.0 - 0.2 %   Neutrophils Relative % 84 %   Neutro Abs 9.8 (H) 1.7 - 7.7 K/uL   Lymphocytes Relative 9 %   Lymphs Abs 1.0 0.7 - 4.0 K/uL   Monocytes Relative 5 %   Monocytes Absolute 0.6 0.1 - 1.0 K/uL   Eosinophils Relative 1 %   Eosinophils Absolute 0.2 0.0 - 0.5 K/uL   Basophils Relative 0 %   Basophils Absolute 0.0 0.0 - 0.1 K/uL   Immature Granulocytes 1 %   Abs Immature Granulocytes 0.08 (H) 0.00 - 0.07 K/uL    Comment: Performed at [facility], [place] [address] [place], [place] [address]  Comprehensive metabolic panel     Status: Abnormal   Collection Time: 12/09/17   7:47 PM  Result Value Ref Range   Sodium 142 135 - 145 mmol/L   Potassium 2.8 (L) 3.5 - 5.1 mmol/L   Chloride 108 98 - 111 mmol/L   CO2 24 22 - 32 mmol/L   Glucose, Bld 101 (H) 70 - 99 mg/dL   BUN 24 (H) 8 - 23 mg/dL   Creatinine, Ser 0.72 0.44 - 1.00 mg/dL   Calcium 8.8 (L) 8.9 - 10.3 mg/dL   Total Protein 6.0 (L) 6.5 - 8.1 g/dL   Albumin 3.4 (L) 3.5 - 5.0 g/dL   AST 15 15 - 41 U/L   ALT 18 0 - 44 U/L   Alkaline Phosphatase 59 38 - 126 U/L   Total Bilirubin 0.3 0.3 - 1.2 mg/dL   GFR calc non Af Amer >60 >60 mL/min   GFR calc Af Amer >60 >60 mL/min   Anion gap 10 5 - 15    Comment: Performed at [facility], [place] [address] [place], [place] [address]   Dg Foot Complete Left  Result Date: 12/09/2017 CLINICAL DATA:  Left foot is cold to touch with approximately 2 inch laceration that is 58-week-old. Erythema of the toes. EXAM: LEFT FOOT - COMPLETE 3+ VIEW COMPARISON:  None. FINDINGS: Osteopenic appearance of the left foot without bone destruction or fracture. There is 28 degrees of hallux valgus of the great toe. No radiopaque foreign body. The reported 2 inch laceration of the foot is radiographically occult. Mild degenerative joint space narrowing of the tibiotalar, subtalar and midfoot articulations. Mild osteoarthritic joint space narrowing of the DIP and PIP joints of the second through fifth toes and interphalangeal joint as well as first MTP of the great toe. 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 No acute fracture, radiopaque foreign body nor bone destruction. Electronically Signed   By: Ashley Royalty M.D.   On: 12/09/2017 16:37   None  Pending Labs Unresulted Labs (From admission, onward)    Start     Ordered   12/09/17 1850  Urinalysis, Routine w reflex microscopic  Once,   R     12/09/17 1849          Vitals/Pain Today's Vitals   12/09/17 1814 12/09/17 1850 12/09/17 2017 12/09/17 2058  BP: 100/87  117/85   Pulse: 88  88    Resp: (!) 22  20   Temp:      TempSrc:  SpO2: 96%  100%   Weight:      Height:      PainSc:  10-Worst pain ever  10-Worst pain ever    Isolation Precautions No active isolations  Medications Medications  sodium chloride 0.9 % bolus 500 mL (has no administration in time range)  potassium chloride 10 mEq in 100 mL IVPB (has no administration in time range)  vancomycin (VANCOCIN) IVPB 1000 mg/200 mL premix (has no administration in time range)  oxyCODONE (Oxy IR/ROXICODONE) immediate release tablet 5 mg (5 mg Oral Given 12/09/17 1641)  cephALEXin (KEFLEX) capsule 500 mg (500 mg Oral Given 12/09/17 1641)  Tdap (BOOSTRIX) injection 0.5 mL (0.5 mLs Intramuscular Given 12/09/17 1750)  oxyCODONE (Oxy IR/ROXICODONE) immediate release tablet 5 mg (5 mg Oral Given 12/09/17 1929)    Mobility non-ambulatory currently

## 2017-12-09 NOTE — Clinical Social Work Note (Addendum)
Clinical Social Work Assessment  Patient Details  Name: [name] MRN: [id number] Date of Birth: Sep 20, 1950  Date of referral:  12/09/17               Reason for consult:  Facility Placement                Permission sought to share information with:    Permission granted to share information::  Yes, Verbal Permission Granted  Name::        Agency::     Relationship::     Contact Information:     Housing/Transportation Living arrangements for the past 2 months:  Single Family Home Source of Information:  Spouse Patient Interpreter Needed:  None Criminal Activity/Legal Involvement Pertinent to Current Situation/Hospitalization:    Significant Relationships:  Spouse Lives with:  Self Do you feel safe going back to the place where you live?  No Need for family participation in patient care:  Yes (Comment)  Care giving concerns:  Pt is well-known to the ED.  Pt's spouse has a history of acting out and has been escorted out of the ED in the past, per staff.  Pt's spouse insisted to the CSW that the CSW begin placement process for a SNF stay for rehab.  CSW explained possible barriers to SNF placement (per chart pt has Medicare B, but not A, as well as a lack of acute, rehab-able conditions) and discussed Hospice Care and Palliative Care options.  Pt's spouse intially dismissed these stating pt's Oncology provider insisted pt was not appropriate for Hospice, although provider's notes state the opposite.  CSW spoke to two facilties via a HIPPA-compliant phone call who stated pt was not a likely candidate for a Medicaid bed at this time for rehab (Part [place] does not pay for rehab).  Pt has mets to pt's lungs and brain (as well as another area), per the provider.  CSW stated this to the pt and pt presented as angry and denied this, stating, "I guess the doctor lied to me then!"  CSW provided de-esculation cues and pt's spouse eventually responded properly and after several requests from the  [place] behaved in a less verbally demanding manner with the CSW.  CSW spoke to provider who stated pt will be seen by a hospitalist and pt's spouse then requested information on Palliative Care.  Pt's husband came to the CSW and requested a Palliative Care Consult.  EPD placed consult   Social Worker assessment / plan:  CSW met with pt's husband who spoke for the pt and confirmed pt's plan to be discharged to SNF for rehab at discharge.  CSW again reiterated that pt may not be deemed appropriate by Medicaid/SNF facilities and that this is not a definate option at this time.  CSW again proposed that [place] in the home may be an eventual option if not an immediate option, and this time the pt's husband took this suggestion under consideration but declined it for now.  CSW and pt discussed Palliative Care as a more immediate possibility and pt's husband was agreeable to consult which EDP placed. CSW provided active listening and validated pt's spouse's concerns.  Pt has been living independently prior to being admitted to [facility].  Employment status:  Disabled (Comment on whether or not currently receiving Disability) Insurance information:  Medicare, Medicaid In State(PART B only) PT Recommendations:  Not assessed at this time Information / Referral to community resources:     Patient/Family's Response to care:  Patient not alert and oriented.  Patient's husband agreeable to plan.  Pt's husband supportive and strongly involved in pt.'s care, but can be verbally demanding AEB  Pt's spouse applying pressure to the CSW to comply with spouse's plan for the pt. Pt's spouse alternatively presenting as disorganized and agitated and then becomes pleasant and appreciates CSW intervention.    Patient/Family's Understanding of and Emotional Response to Diagnosis, Current Treatment, and Prognosis:  Still assessing   Emotional Assessment Appearance:  Appears stated age Attitude/Demeanor/Rapport:    Affect  (typically observed):    Orientation:    Alcohol / Substance use:    Psych involvement (Current and /or in the community):     Discharge Needs  Concerns to be addressed:    Readmission within the last 30 days:  Yes Current discharge risk:  None Barriers to Discharge:      Claudine Mouton, [place] 12/09/2017, 9:55 PM

## 2017-12-09 NOTE — ED Triage Notes (Signed)
Patient's left foot cold to touch, approx 2 inch laceration (85 weeks old), redness to toes.

## 2017-12-09 NOTE — Telephone Encounter (Signed)
Patient drop something on her foot last week and has a bad cut on it that looks infected and swollen. [phone]

## 2017-12-09 NOTE — Telephone Encounter (Signed)
Called to report husband is requesting Hospice care in home for his wife. Has multiple needs that he can't provide for. If MD agrees, their CSW can help coordinate the referral. She is also en route to the ER for a laceration on her left ankle from over a week ago that has not had wound care and looks infected.  Next appointment with Dr. Benay Spice is 12/15/17.

## 2017-12-09 NOTE — Telephone Encounter (Signed)
Per Venora Maples patient has been advised to go to ED or urgent care to have foot evaluate. Patient may need stitches or a antibiotics. Beth from [facility] will advise patient of information.

## 2017-12-09 NOTE — ED Triage Notes (Signed)
Per EMS- patient c/o left foot injury with a laceration on top of the foot that happened 2 weeks ago.

## 2017-12-09 NOTE — Telephone Encounter (Signed)
Beth called back and also states that patient is wanting to have a referral for hospice to come in for end of life care. Beth states that she with contact oncology to get orders and if they need anything else they will contact us.

## 2017-12-09 NOTE — ED Notes (Signed)
Pt does not want to be stuck by anyone but the MD with an ultrasound

## 2017-12-09 NOTE — Telephone Encounter (Signed)
Per Dr. Benay Spice: He would like to discuss this with the patient personally. Follow up on 12/15/17 as scheduled. She would be appropriate for Hospice. Notified Beth, PTA.

## 2017-12-09 NOTE — ED Provider Notes (Signed)
[facility] DEPT Provider Note   CSN: [id number] Arrival date & time: 12/09/17  1535     History   Chief Complaint Chief Complaint  Patient presents with  . Foot Injury    HPI [name] is a 67 y.o. female.  HPI Patient states she dropped a glass on her left foot 2 weeks ago sustaining laceration to the dorsum of the foot.  Denies any current pain.  States she was informed to come to the emergency department due to concern for wound infection.  No fever or chills.  Patient is asking for pain medication for her chronic right hip pain.  States she normally takes oxycodone. Past Medical History:  Diagnosis Date  . Cancer ([place])    renal ca  . COPD (chronic obstructive pulmonary disease) ([place])   . Drug-seeking behavior   . Pancreatitis   . Pancreatitis   . Seizures ([place])   . Substance abuse [facility])     Patient Active Problem List   Diagnosis Date Noted  . Cellulitis 12/09/2017  . Right hemiplegia ([place]) 11/21/2017  . 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 Lung mass 10/04/2017  . Osteoarthritis of facet joint of lumbar spine 07/26/2017  . Low back pain radiating to both legs 07/26/2017  . Substance-induced psychotic disorder with delusions ([place]) 07/12/2017  . Failure to thrive in adult 06/21/2017  . Pressure injury of skin 04/06/2017  . Pressure injury of skin of right ischial tuberosity region: Stage 2 04/06/2017  . Benzodiazepine dependence ([place])   . Narcotic addiction ([place]) 02/29/2016  . Abdominal pain, chronic, epigastric- due to chronic pancreatitis 02/29/2016  . COPD (chronic obstructive pulmonary disease) ([place]) 02/28/2016  . Cough 09/03/2015  . Abdominal pain 01/03/2015  . Hyponatremia 01/03/2015  . Hypocalcemia 01/03/2015  . Underweight 01/03/2015  . COPD GOLD III with reversible component  07/17/2014  . Chronic alcoholic pancreatitis ([place]) 06/02/2013  . Recurrent acute pancreatitis  03/28/2013  . Alcohol withdrawal ([place]) 03/28/2013  . Pancreatitis 03/09/2013  . Tobacco abuse 03/09/2013  . Alcohol withdrawal syndrome with perceptual disturbance ([place]) 03/09/2013  . Benzodiazepine withdrawal ([place]) 02/09/2013  . Protein-calorie malnutrition, severe ([place]) 02/09/2013  . Hypokalemia 02/27/2011  . Abdominal pain, acute 02/25/2011  . Nausea and vomiting 02/25/2011  . Thrombocytopenia ([place]) 02/25/2011  . HTN (hypertension), malignant 02/25/2011  . 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 Anxiety state 10/18/2008  . GERD 10/18/2008  . PANCREATITIS, CHRONIC- atrophic pancreas 10/18/2008  . DYSPNEA 10/18/2008  . GASTRIC ULCER, HX OF 10/18/2008    Past Surgical History:  Procedure Laterality Date  . HIP SURGERY Bilateral   . IR GENERIC HISTORICAL  05/08/2015   IR RADIOLOGIST EVAL & MGMT 05/08/2015 Aletta Edouard, MD [facility] INTERV RAD  . IR GENERIC HISTORICAL  01/08/2014   IR RADIOLOGIST EVAL & MGMT 01/08/2014 Aletta Edouard, MD [facility] INTERV RAD  . IR RADIOLOGY PERIPHERAL GUIDED IV START  10/03/2017  . IR RADIOLOGY PERIPHERAL GUIDED IV START  10/10/2017  . IR US GUIDE VASC ACCESS RIGHT  10/05/2017  . IR US GUIDE VASC ACCESS RIGHT  10/10/2017  . KIDNEY SURGERY     removed cancerous lesions  . PARTIAL GASTRECTOMY       OB History   None      Home Medications    Prior to Admission medications   Medication Sig Start Date End Date Taking? Authorizing Provider  dexamethasone (DECADRON) 4 MG tablet Take 1  tablet (4 mg total) by mouth every 12 (twelve) hours. 11/23/17 12/23/17 Yes Adhikari, Tamsen Meek, MD  DULERA 200-5 MCG/ACT AERO Inhale 2 puffs into the lungs 2 (two) times daily. 11/08/17  Yes [provider]  famotidine (PEPCID) 20 MG tablet Take 1 tablet (20 mg total) by mouth daily. Patient taking differently: Take 20 mg by mouth daily as needed for heartburn or indigestion.  08/28/17  Yes Khatri, Hina, PA-C  gabapentin (NEURONTIN) 300 MG  capsule Take 300 mg by mouth 4 (four) times daily.  12/05/17  Yes [provider]  ipratropium-albuterol (DUONEB) 0.5-2.5 (3) MG/3ML SOLN Take 3 mLs by nebulization every 6 (six) hours as needed. Use 3 times daily x 4 days, then every 6 hours as needed. Patient taking differently: Take 3 mLs by nebulization every 6 (six) hours as needed (sob and wheezing).  10/25/17  Yes Eugenie Filler, MD  LORazepam (ATIVAN) 1 MG tablet Take 1 tablet (1 mg total) by mouth every 8 (eight) hours as needed for anxiety. 10/25/17  Yes Eugenie Filler, MD  losartan (COZAAR) 50 MG tablet Take 50 mg by mouth daily.   Yes [provider]  megestrol (MEGACE) 20 MG tablet Take 1 tablet (20 mg total) by mouth daily. 11/18/17  Yes Azzie Glatter, FNP  mirtazapine (REMERON) 45 MG tablet Take 45 mg by mouth at bedtime.  12/05/17  Yes [provider]  Oxycodone HCl 10 MG TABS Take 10 mg by mouth 4 (four) times daily as needed (pain).  11/28/17  Yes [provider]  PROAIR HFA 108 (90 Base) MCG/ACT inhaler Inhale 1 puff into the lungs every 4 (four) hours as needed for wheezing or shortness of breath. 11/01/17  Yes Azzie Glatter, FNP  tiotropium (SPIRIVA HANDIHALER) 18 MCG inhalation capsule PLACE 1 CAPSULE INTO INHALER AND INHALE DAILY Patient taking differently: Place 18 mcg into inhaler and inhale daily.  10/29/17  Yes Eugenie Filler, MD  feeding supplement, ENSURE ENLIVE, (ENSURE ENLIVE) LIQD Take 237 mLs by mouth 3 (three) times daily between meals. Patient not taking: Reported on 12/09/2017 10/25/17   Eugenie Filler, MD  ondansetron (ZOFRAN) 4 MG tablet Take 1 tablet (4 mg total) by mouth daily as needed. Patient not taking: Reported on 12/09/2017 11/09/17   Azzie Glatter, FNP  oxyCODONE-acetaminophen (PERCOCET) 5-325 MG tablet Take 1-2 tablets by mouth every 6 (six) hours as needed. Patient not taking: Reported on 12/09/2017 11/14/17   Veryl Speak, MD  polyethylene  glycol (MIRALAX / GLYCOLAX) packet Take 17 g by mouth daily as needed for mild constipation. 11/23/17 12/23/17  Shelly Coss, MD    Family History Family History  Problem Relation Age of Onset  . CAD Mother   . Alcoholism Father   . COPD Father   . Lung cancer Father   . Rectal cancer Maternal Uncle     Social History Social History   Tobacco Use  . Smoking status: Current Every Day Smoker    Packs/day: 0.50    Years: 30.00    Pack years: 15.00    Types: Cigarettes  . Smokeless tobacco: Never Used  . Tobacco comment: smoking up to 1.5 ppd per husband. down from 3pk day to 1.5   Substance Use Topics  . Alcohol use: Not Currently    Alcohol/week: 0.0 standard drinks    Comment: "I stopped drinking 1 year ago"  . Drug use: No    Comment: Hx of polysubstance drug abuse  Allergies   Aspirin and Chlorpromazine hcl   Review of Systems Review of Systems  Constitutional: Negative for chills and fever.  Musculoskeletal: Negative for arthralgias.  Skin: Positive for wound.  Neurological: Negative for weakness and numbness.  All other systems reviewed and are negative.    Physical Exam Updated Vital Signs BP 135/69 (BP Location: Left Arm)   Pulse 82   Temp 97.9 F (36.6 C) (Oral)   Resp 20   Ht 5\' 4"  (1.626 m)   Wt 31.8 kg   SpO2 96%   BMI 12.02 kg/m   Physical Exam  Constitutional: She is oriented to person, place, and time. She appears well-developed.  Chronically ill appearing.  Wasted.  HENT:  Head: Normocephalic and atraumatic.  Mouth/Throat: Oropharynx is clear and moist.  Eyes: Pupils are equal, round, and reactive to light. EOM are normal.  Neck: Normal range of motion. Neck supple.  Cardiovascular: Normal rate and regular rhythm.  Pulmonary/Chest: Effort normal and breath sounds normal.  Abdominal: Soft. Bowel sounds are normal. There is no tenderness. There is no rebound and no guarding.  Musculoskeletal: Normal range of motion. She exhibits  no edema or tenderness.  Patient has a 3 cm laceration over the dorsum of the left foot just proximal to the fourth digit.  No obvious gross contamination.  No active bleeding.  Patient has some generalized redness and swelling to the dorsum of the foot.  No warmth.  1+ dorsalis pedis and posterior tibial pulses.  Neurological: She is alert and oriented to person, place, and time.  Skin: Skin is warm and dry. No rash noted. There is erythema.  Psychiatric: She has a normal mood and affect. Her behavior is normal.  Nursing note and vitals reviewed.    ED Treatments / Results  Labs (all labs ordered are listed, but only abnormal results are displayed) Labs Reviewed  CBC WITH DIFFERENTIAL/PLATELET - Abnormal; Notable for the following components:      Result Value   WBC 11.6 (*)    RBC 3.86 (*)    Hemoglobin 10.7 (*)    MCHC 29.3 (*)    RDW 17.2 (*)    Neutro Abs 9.8 (*)    Abs Immature Granulocytes 0.08 (*)    All other components within normal limits  COMPREHENSIVE METABOLIC PANEL - Abnormal; Notable for the following components:   Potassium 2.8 (*)    Glucose, Bld 101 (*)    BUN 24 (*)    Calcium 8.8 (*)    Total Protein 6.0 (*)    Albumin 3.4 (*)    All other components within normal limits  URINALYSIS, ROUTINE W REFLEX MICROSCOPIC    EKG None  Radiology Dg Foot Complete Left  Result Date: 12/09/2017 CLINICAL DATA:  Left foot is cold to touch with approximately 2 inch laceration that is 55-week-old. Erythema of the toes. EXAM: LEFT FOOT - COMPLETE 3+ VIEW COMPARISON:  None. FINDINGS: Osteopenic appearance of the left foot without bone destruction or fracture. There is 28 degrees of hallux valgus of the great toe. No radiopaque foreign body. The reported 2 inch laceration of the foot is radiographically occult. Mild degenerative joint space narrowing of the tibiotalar, subtalar and midfoot articulations. Mild osteoarthritic joint space narrowing of the DIP and PIP joints of the  second through fifth toes and interphalangeal joint as well as first MTP of the great toe. 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 No acute fracture, radiopaque foreign body nor bone destruction. Electronically  Signed   By: Ashley Royalty M.D.   On: 12/09/2017 16:37    Procedures Procedures (including critical care time)  Medications Ordered in ED Medications  sodium chloride 0.9 % bolus 500 mL (has no administration in time range)  potassium chloride 10 mEq in 100 mL IVPB (has no administration in time range)  vancomycin (VANCOCIN) IVPB 1000 mg/200 mL premix (has no administration in time range)  oxyCODONE-acetaminophen (PERCOCET/ROXICET) 5-325 MG per tablet 1 tablet (has no administration in time range)  oxyCODONE (Oxy IR/ROXICODONE) immediate release tablet 5 mg (5 mg Oral Given 12/09/17 1641)  cephALEXin (KEFLEX) capsule 500 mg (500 mg Oral Given 12/09/17 1641)  Tdap (BOOSTRIX) injection 0.5 mL (0.5 mLs Intramuscular Given 12/09/17 1750)  oxyCODONE (Oxy IR/ROXICODONE) immediate release tablet 5 mg (5 mg Oral Given 12/09/17 1929)     Initial Impression / Assessment and Plan / ED Course  I have reviewed the triage vital signs and the nursing notes.  Pertinent labs & imaging results that were available during my care of the patient were reviewed by me and considered in my medical decision making (see chart for details).     Patient with 2-week status post sustaining laceration to the dorsum of the left foot.  Mild erythema without warmth.  Will start antibiotics and update tetanus. Patient's husband stating he is having difficulty care for patient at home.  Increase generalized weakness.  Interested in nursing home placement.  Labs with hypokalemia and mild elevation in white blood cell count.  Discussed with hospitalist who will admit.  Social work has been involved. Final Clinical Impressions(s) / ED Diagnoses   Final diagnoses:  Cellulitis of left lower extremity    Hypokalemia    ED Discharge Orders    None       Julianne Rice, MD 12/09/17 2323

## 2017-12-09 NOTE — Progress Notes (Addendum)
Pt's husband came to the CSW and requested a Palliative Care Consult.  EPD placed consult.  CSW will continue to follow for D/C needs.  Alphonse Guild. Ciara Kagan, LCSW, LCAS, CSI Clinical Social Worker Ph: [phone]

## 2017-12-10 ENCOUNTER — Encounter (HOSPITAL_COMMUNITY): Payer: Self-pay | Admitting: Internal Medicine

## 2017-12-10 DIAGNOSIS — E43 Unspecified severe protein-calorie malnutrition: Secondary | ICD-10-CM | POA: Diagnosis not present

## 2017-12-10 DIAGNOSIS — I10 Essential (primary) hypertension: Secondary | ICD-10-CM | POA: Diagnosis not present

## 2017-12-10 DIAGNOSIS — E876 Hypokalemia: Secondary | ICD-10-CM

## 2017-12-10 DIAGNOSIS — L03116 Cellulitis of left lower limb: Principal | ICD-10-CM | POA: Diagnosis present

## 2017-12-10 DIAGNOSIS — J449 Chronic obstructive pulmonary disease, unspecified: Secondary | ICD-10-CM

## 2017-12-10 DIAGNOSIS — R531 Weakness: Secondary | ICD-10-CM

## 2017-12-10 LAB — CBC
HEMATOCRIT: 31.4 % — AB (ref 36.0–46.0)
HEMOGLOBIN: 9.8 g/dL — AB (ref 12.0–15.0)
MCH: 27.5 pg (ref 26.0–34.0)
MCHC: 31.2 g/dL (ref 30.0–36.0)
MCV: 88.2 fL (ref 80.0–100.0)
Platelets: 178 10*3/uL (ref 150–400)
RBC: 3.56 MIL/uL — ABNORMAL LOW (ref 3.87–5.11)
RDW: 17 % — ABNORMAL HIGH (ref 11.5–15.5)
WBC: 12.9 10*3/uL — AB (ref 4.0–10.5)
nRBC: 0.2 % (ref 0.0–0.2)

## 2017-12-10 LAB — BASIC METABOLIC PANEL
Anion gap: 13 (ref 5–15)
BUN: 27 mg/dL — ABNORMAL HIGH (ref 8–23)
CO2: 14 mmol/L — ABNORMAL LOW (ref 22–32)
Calcium: 8.1 mg/dL — ABNORMAL LOW (ref 8.9–10.3)
Chloride: 110 mmol/L (ref 98–111)
Creatinine, Ser: 0.92 mg/dL (ref 0.44–1.00)
GFR calc Af Amer: >60 mL/min (ref >60)
GFR calc non Af Amer: >60 mL/min (ref >60)
Glucose, Bld: 297 mg/dL — ABNORMAL HIGH (ref 70–99)
Potassium: 4 mmol/L (ref 3.5–5.1)
SODIUM: 137 mmol/L (ref 135–145)

## 2017-12-10 MED ORDER — MIRTAZAPINE 15 MG PO TABS
45.0000 mg | ORAL_TABLET | Freq: Every day | ORAL | Status: DC
  Administered 2017-12-10 – 2017-12-12 (×4): 45 mg via ORAL
  Filled 2017-12-10 (×5): qty 3

## 2017-12-10 MED ORDER — ONDANSETRON HCL 4 MG/2ML IJ SOLN
4.0000 mg | Freq: Four times a day (QID) | INTRAMUSCULAR | Status: DC | PRN
  Administered 2017-12-11 – 2017-12-13 (×5): 4 mg via INTRAVENOUS
  Filled 2017-12-10 (×5): qty 2

## 2017-12-10 MED ORDER — MOMETASONE FURO-FORMOTEROL FUM 200-5 MCG/ACT IN AERO
2.0000 | INHALATION_SPRAY | Freq: Two times a day (BID) | RESPIRATORY_TRACT | Status: DC
  Administered 2017-12-10 – 2017-12-13 (×6): 2 via RESPIRATORY_TRACT
  Filled 2017-12-10: qty 8.8

## 2017-12-10 MED ORDER — DEXAMETHASONE 4 MG PO TABS
4.0000 mg | ORAL_TABLET | Freq: Two times a day (BID) | ORAL | Status: DC
  Administered 2017-12-10 – 2017-12-12 (×6): 4 mg via ORAL
  Filled 2017-12-10 (×6): qty 1

## 2017-12-10 MED ORDER — MEGESTROL ACETATE 20 MG PO TABS
20.0000 mg | ORAL_TABLET | Freq: Every day | ORAL | Status: DC
  Administered 2017-12-10: 20 mg via ORAL
  Filled 2017-12-10: qty 1

## 2017-12-10 MED ORDER — MEGESTROL ACETATE 400 MG/10ML PO SUSP
200.0000 mg | Freq: Every day | ORAL | Status: DC
  Administered 2017-12-10 – 2017-12-12 (×3): 200 mg via ORAL
  Filled 2017-12-10 (×3): qty 10

## 2017-12-10 MED ORDER — OXYCODONE HCL 5 MG PO TABS
10.0000 mg | ORAL_TABLET | Freq: Four times a day (QID) | ORAL | Status: DC | PRN
  Administered 2017-12-10 – 2017-12-13 (×13): 10 mg via ORAL
  Filled 2017-12-10 (×13): qty 2

## 2017-12-10 MED ORDER — ONDANSETRON HCL 4 MG PO TABS
4.0000 mg | ORAL_TABLET | Freq: Four times a day (QID) | ORAL | Status: DC | PRN
  Administered 2017-12-10 – 2017-12-12 (×3): 4 mg via ORAL
  Filled 2017-12-10 (×3): qty 1

## 2017-12-10 MED ORDER — POLYETHYLENE GLYCOL 3350 17 G PO PACK: 17.0000 g | PACK | Freq: Every day | ORAL | Status: DC | PRN

## 2017-12-10 MED ORDER — FAMOTIDINE 20 MG PO TABS
20.0000 mg | ORAL_TABLET | Freq: Every day | ORAL | Status: DC
  Administered 2017-12-10 – 2017-12-13 (×4): 20 mg via ORAL
  Filled 2017-12-10 (×4): qty 1

## 2017-12-10 MED ORDER — UMECLIDINIUM BROMIDE 62.5 MCG/INH IN AEPB
1.0000 | INHALATION_SPRAY | Freq: Every day | RESPIRATORY_TRACT | Status: DC
  Administered 2017-12-10 – 2017-12-13 (×3): 1 via RESPIRATORY_TRACT
  Filled 2017-12-10: qty 7

## 2017-12-10 MED ORDER — GABAPENTIN 300 MG PO CAPS
300.0000 mg | ORAL_CAPSULE | Freq: Four times a day (QID) | ORAL | Status: DC
  Administered 2017-12-10 – 2017-12-13 (×12): 300 mg via ORAL
  Filled 2017-12-10 (×12): qty 1

## 2017-12-10 MED ORDER — ENOXAPARIN SODIUM 30 MG/0.3ML ~~LOC~~ SOLN
30.0000 mg | Freq: Every day | SUBCUTANEOUS | Status: DC
  Administered 2017-12-10 – 2017-12-12 (×3): 30 mg via SUBCUTANEOUS
  Filled 2017-12-10 (×4): qty 0.3

## 2017-12-10 MED ORDER — LOSARTAN POTASSIUM 50 MG PO TABS
50.0000 mg | ORAL_TABLET | Freq: Every day | ORAL | Status: DC
  Administered 2017-12-10 – 2017-12-13 (×4): 50 mg via ORAL
  Filled 2017-12-10 (×4): qty 1

## 2017-12-10 MED ORDER — LORAZEPAM 1 MG PO TABS
1.0000 mg | ORAL_TABLET | Freq: Three times a day (TID) | ORAL | Status: DC | PRN
  Administered 2017-12-10 – 2017-12-12 (×6): 1 mg via ORAL
  Filled 2017-12-10 (×6): qty 1

## 2017-12-10 MED ORDER — ACETAMINOPHEN 650 MG RE SUPP: 650.0000 mg | Freq: Four times a day (QID) | RECTAL | Status: DC | PRN

## 2017-12-10 MED ORDER — DOXYCYCLINE HYCLATE 100 MG PO TABS
100.0000 mg | ORAL_TABLET | Freq: Two times a day (BID) | ORAL | Status: DC
  Administered 2017-12-10 (×2): 100 mg via ORAL
  Filled 2017-12-10 (×2): qty 1

## 2017-12-10 MED ORDER — OXYCODONE HCL ER 10 MG PO T12A
10.0000 mg | EXTENDED_RELEASE_TABLET | Freq: Two times a day (BID) | ORAL | Status: DC
  Administered 2017-12-10 (×2): 10 mg via ORAL
  Filled 2017-12-10 (×2): qty 1

## 2017-12-10 MED ORDER — POLYETHYLENE GLYCOL 3350 17 G PO PACK
17.0000 g | PACK | Freq: Every day | ORAL | Status: DC
  Administered 2017-12-10 – 2017-12-13 (×4): 17 g via ORAL
  Filled 2017-12-10 (×4): qty 1

## 2017-12-10 MED ORDER — ACETAMINOPHEN 325 MG PO TABS
650.0000 mg | ORAL_TABLET | Freq: Four times a day (QID) | ORAL | Status: DC | PRN
  Administered 2017-12-10 – 2017-12-12 (×2): 650 mg via ORAL
  Filled 2017-12-10 (×2): qty 2

## 2017-12-10 NOTE — Progress Notes (Signed)
Lovenox per Pharmacy for DVT Prophylaxis    Pharmacy has been consulted from dosing enoxaparin (lovenox) in this patient for DVT prophylaxis.  The pharmacist has reviewed pertinent labs (Hgb _10.7__; PLT_192__), patient weight (_31.8__kg) and renal function (CrCl_35.7__mL/min) and decided that enoxaparin _30_mg SQ Q24Hrs is appropriate for this patient.  The pharmacy department will sign off at this time.  Please reconsult pharmacy if status changes or for further issues.  Thank you  Cyndia Diver PharmD, BCPS  12/10/2017, 12:21 AM

## 2017-12-10 NOTE — H&P (Signed)
History and Physical    [name] BZJ:[id number] DOB: February 28, 1950 DOA: 12/09/2017  PCP: Azzie Glatter, FNP   Patient coming from: Home.  Chief Complaint: Left leg wound.  HPI: [name] is a 67 y.o. female with history of recently diagnosed metastatic adenocarcinoma of unknown origin with solitary brain mass who presented 2 weeks ago with left-sided hemiplegia was found to have brain mets and undergone stereotactic laser surgery 2 days ago was brought to the ER patient has become more weak and left foot injury with ulceration.  Patient had a fall 2 weeks ago following which patient sustained a small laceration of the left foot dorsum area which has become more erythematous concerning for infection.  ED Course: In the ER patient had labs drawn and was started on vancomycin with subsequent which patient had lost IV access and is placed on doxycycline.  Patient also feeling increasingly weak.  Low potassium.  Was given oral potassium will need to be rechecked.  Review of Systems: As per HPI, rest all negative.   Past Medical History:  Diagnosis Date  . Cancer ([place])    renal ca  . COPD (chronic obstructive pulmonary disease) ([place])   . Drug-seeking behavior   . Pancreatitis   . Pancreatitis   . Seizures ([place])   . Substance abuse [facility])     Past Surgical History:  Procedure Laterality Date  . HIP SURGERY Bilateral   . IR GENERIC HISTORICAL  05/08/2015   IR RADIOLOGIST EVAL & MGMT 05/08/2015 Aletta Edouard, MD [facility] INTERV RAD  . IR GENERIC HISTORICAL  01/08/2014   IR RADIOLOGIST EVAL & MGMT 01/08/2014 Aletta Edouard, MD [facility] INTERV RAD  . IR RADIOLOGY PERIPHERAL GUIDED IV START  10/03/2017  . IR RADIOLOGY PERIPHERAL GUIDED IV START  10/10/2017  . IR US GUIDE VASC ACCESS RIGHT  10/05/2017  . IR US GUIDE VASC ACCESS RIGHT  10/10/2017  . KIDNEY SURGERY     removed cancerous lesions  . PARTIAL GASTRECTOMY       reports that she has been smoking cigarettes. She has a 15.00  pack-year smoking history. She has never used smokeless tobacco. She reports that she drank alcohol. She reports that she does not use drugs.  Allergies  Allergen Reactions  . Aspirin Other (See Comments)    Break out in welts  . Chlorpromazine Hcl Other (See Comments)    Muscle spasms - "really bad"    Family History  Problem Relation Age of Onset  . CAD Mother   . Alcoholism Father   . COPD Father   . Lung cancer Father   . Rectal cancer Maternal Uncle     Prior to Admission medications   Medication Sig Start Date End Date Taking? Authorizing Provider  dexamethasone (DECADRON) 4 MG tablet Take 1 tablet (4 mg total) by mouth every 12 (twelve) hours. 11/23/17 12/23/17 Yes Adhikari, Tamsen Meek, MD  DULERA 200-5 MCG/ACT AERO Inhale 2 puffs into the lungs 2 (two) times daily. 11/08/17  Yes [provider]  famotidine (PEPCID) 20 MG tablet Take 1 tablet (20 mg total) by mouth daily. Patient taking differently: Take 20 mg by mouth daily as needed for heartburn or indigestion.  08/28/17  Yes Khatri, Hina, PA-C  gabapentin (NEURONTIN) 300 MG capsule Take 300 mg by mouth 4 (four) times daily.  12/05/17  Yes [provider]  ipratropium-albuterol (DUONEB) 0.5-2.5 (3) MG/3ML SOLN Take 3 mLs by nebulization every 6 (six) hours as needed. Use 3 times  daily x 4 days, then every 6 hours as needed. Patient taking differently: Take 3 mLs by nebulization every 6 (six) hours as needed (sob and wheezing).  10/25/17  Yes Eugenie Filler, MD  LORazepam (ATIVAN) 1 MG tablet Take 1 tablet (1 mg total) by mouth every 8 (eight) hours as needed for anxiety. 10/25/17  Yes Eugenie Filler, MD  losartan (COZAAR) 50 MG tablet Take 50 mg by mouth daily.   Yes [provider]  megestrol (MEGACE) 20 MG tablet Take 1 tablet (20 mg total) by mouth daily. 11/18/17  Yes Azzie Glatter, FNP  mirtazapine (REMERON) 45 MG tablet Take 45 mg by mouth at bedtime.  12/05/17  Yes [provider]   Oxycodone HCl 10 MG TABS Take 10 mg by mouth 4 (four) times daily as needed (pain).  11/28/17  Yes [provider]  PROAIR HFA 108 (90 Base) MCG/ACT inhaler Inhale 1 puff into the lungs every 4 (four) hours as needed for wheezing or shortness of breath. 11/01/17  Yes Azzie Glatter, FNP  tiotropium (SPIRIVA HANDIHALER) 18 MCG inhalation capsule PLACE 1 CAPSULE INTO INHALER AND INHALE DAILY Patient taking differently: Place 18 mcg into inhaler and inhale daily.  10/29/17  Yes Eugenie Filler, MD  feeding supplement, ENSURE ENLIVE, (ENSURE ENLIVE) LIQD Take 237 mLs by mouth 3 (three) times daily between meals. Patient not taking: Reported on 12/09/2017 10/25/17   Eugenie Filler, MD  ondansetron (ZOFRAN) 4 MG tablet Take 1 tablet (4 mg total) by mouth daily as needed. Patient not taking: Reported on 12/09/2017 11/09/17   Azzie Glatter, FNP  oxyCODONE-acetaminophen (PERCOCET) 5-325 MG tablet Take 1-2 tablets by mouth every 6 (six) hours as needed. Patient not taking: Reported on 12/09/2017 11/14/17   Veryl Speak, MD  polyethylene glycol (MIRALAX / GLYCOLAX) packet Take 17 g by mouth daily as needed for mild constipation. 11/23/17 12/23/17  Shelly Coss, MD    Physical Exam: Vitals:   12/09/17 1553 12/09/17 1814 12/09/17 2017 12/09/17 2248  BP:  100/87 117/85 135/69  Pulse:  88 88 82  Resp:  (!) 22 20 20   Temp:    97.9 F (36.6 C)  TempSrc:    Oral  SpO2:  96% 100% 96%  Weight: 31.8 kg     Height: 5\' 4"  (1.626 m)         Constitutional: Moderately built and nourished. Vitals:   12/09/17 1553 12/09/17 1814 12/09/17 2017 12/09/17 2248  BP:  100/87 117/85 135/69  Pulse:  88 88 82  Resp:  (!) 22 20 20   Temp:    97.9 F (36.6 C)  TempSrc:    Oral  SpO2:  96% 100% 96%  Weight: 31.8 kg     Height: 5\' 4"  (1.626 m)      Eyes: Anicteric no pallor. ENMT: No discharge from the ears eyes nose or mouth. Neck: No mass or.  No neck rigidity. Respiratory: No rhonchi  or crepitations. Cardiovascular: S1-S2 heard. Abdomen: Soft nontender bowel sounds present. Musculoskeletal: Left foot dorsum looks erythematous with a dressing. Skin: Left foot dorsum looks erythematous. Neurologic: Alert awake oriented to time place and person with left-sided hemiplegia. Psychiatric: Appears normal.   Labs on Admission: I have personally reviewed following labs and imaging studies  CBC: Recent Labs  Lab 12/09/17 1947  WBC 11.6*  NEUTROABS 9.8*  HGB 10.7*  HCT 36.5  MCV 94.6  PLT 967   Basic Metabolic Panel: Recent Labs  Lab  12/09/17 1947  NA 142  K 2.8*  CL 108  CO2 24  GLUCOSE 101*  BUN 24*  CREATININE 0.72  CALCIUM 8.8*   GFR: Estimated Creatinine Clearance: 35.7 mL/min (by C-G formula based on SCr of 0.72 mg/dL). Liver Function Tests: Recent Labs  Lab 12/09/17 1947  AST 15  ALT 18  ALKPHOS 59  BILITOT 0.3  PROT 6.0*  ALBUMIN 3.4*   No results for input(s): LIPASE, AMYLASE in the last 168 hours. No results for input(s): AMMONIA in the last 168 hours. Coagulation Profile: No results for input(s): INR, PROTIME in the last 168 hours. Cardiac Enzymes: No results for input(s): CKTOTAL, CKMB, CKMBINDEX, TROPONINI in the last 168 hours. BNP (last 3 results) No results for input(s): PROBNP in the last 8760 hours. HbA1C: No results for input(s): HGBA1C in the last 72 hours. CBG: No results for input(s): GLUCAP in the last 168 hours. Lipid Profile: No results for input(s): CHOL, HDL, LDLCALC, TRIG, CHOLHDL, LDLDIRECT in the last 72 hours. Thyroid Function Tests: No results for input(s): TSH, T4TOTAL, FREET4, T3FREE, THYROIDAB in the last 72 hours. Anemia Panel: No results for input(s): VITAMINB12, FOLATE, FERRITIN, TIBC, IRON, RETICCTPCT in the last 72 hours. Urine analysis:    Component Value Date/Time   COLORURINE YELLOW 11/21/2017 0745   APPEARANCEUR CLEAR 11/21/2017 0745   LABSPEC 1.014 11/21/2017 0745   PHURINE 6.0 11/21/2017  0745   GLUCOSEU >=500 (A) 11/21/2017 0745   HGBUR MODERATE (A) 11/21/2017 0745   BILIRUBINUR NEGATIVE 11/21/2017 0745   BILIRUBINUR negative 06/03/2017 1120   KETONESUR NEGATIVE 11/21/2017 0745   PROTEINUR NEGATIVE 11/21/2017 0745   UROBILINOGEN 0.2 06/03/2017 1120   UROBILINOGEN 0.2 03/15/2017 1004   NITRITE NEGATIVE 11/21/2017 0745   LEUKOCYTESUR NEGATIVE 11/21/2017 0745   Sepsis Labs: @LABRCNTIP (procalcitonin:4,lacticidven:4) )No results found for this or any previous visit (from the past 240 hour(s)).   Radiological Exams on Admission: Dg Foot Complete Left  Result Date: 12/09/2017 CLINICAL DATA:  Left foot is cold to touch with approximately 2 inch laceration that is 33-week-old. Erythema of the toes. EXAM: LEFT FOOT - COMPLETE 3+ VIEW COMPARISON:  None. FINDINGS: Osteopenic appearance of the left foot without bone destruction or fracture. There is 28 degrees of hallux valgus of the great toe. No radiopaque foreign body. The reported 2 inch laceration of the foot is radiographically occult. Mild degenerative joint space narrowing of the tibiotalar, subtalar and midfoot articulations. Mild osteoarthritic joint space narrowing of the DIP and PIP joints of the second through fifth toes and interphalangeal joint as well as first MTP of the great toe. 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 No acute fracture, radiopaque foreign body nor bone destruction. Electronically Signed   By: Ashley Royalty M.D.   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 Cellulitis of the left foot on doxycycline for now.  Once IV access available may change to vancomycin.  I have requested midline placement. 2. Hypokalemia cause not clear could be from poor oral intake.   Replace recheck. 3. COPD on inhalers which will be continued. 4. Hypertension on Cozaar. 5. Left-sided hemiplegia with brain mets has undergone stereotactic radiation 2 days ago.  Physical therapy consult. 6. Adenocarcinoma of unknown origin with brain and  lung mets.  On dexamethasone. 7. Chronic anemia follow CBC.   DVT prophylaxis: Lovenox. Code Status: Full code. Family Communication: Discussed with patient. Disposition Plan: To be determined. Consults called: Physical therapy. Admission status: Observation.   Rise Patience MD [facility] Pager [phone].  If 7PM-7AM, please contact night-coverage [email or web address] Password [email or web address]  12/10/2017, 12:16 AM

## 2017-12-10 NOTE — Progress Notes (Addendum)
[facility] PROGRESS NOTE    Progress Note  [name]  GGE:[id number] DOB: 10-13-1950 DOA: 12/09/2017 PCP: Azzie Glatter, FNP     Brief Narrative:   [name] is an 67 y.o. 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 Change oral doxycycline to IV as she is nauseated. Get a PICC line. We will get physical therapy and social worker to evaluate the patient.  Possible skilled nursing facility placement.  Hypokalemia: Replete orally  COPD GOLD III with reversible component  Continues to be stable continue inhalers.  Essential  HTN (hypertension), malignant Continue Cozaar blood pressure stable.  Left-sided hemiplegia with brain mets: Physical therapy has been consulted continue radiation therapy.  Metastatic adenocarcinoma of unknown origin: Continue dexamethasone follow-up with oncology as an outpatient.  RN Pressure Injury Documentation:   Severe protein caloric malnutrition: Estimated body mass index is 12.02 kg/m as calculated from the following:   Height as of this encounter: 5\' 4"  (1.626 m).   Weight as of this encounter: 31.8 kg.       DVT prophylaxis: lovenxo Family Communication:husband Disposition Plan/Barrier to D/C: unable to detemrine Code Status:     Code Status Orders  (From admission, onward)         Start     Ordered   12/10/17 0015  Full code  Continuous     12/10/17 0015        Code Status History    Date Active Date Inactive Code Status Order ID Comments User Context   11/21/2017 1554 11/23/2017 1413 Full Code [id number]  Annita Brod, MD Inpatient   10/23/2017 2230 10/25/2017 1942  Full Code [id number]  Eugenie Filler, MD Inpatient   09/17/2017 1540 09/24/2017 1724 Full Code [id number]  Nita Sells, MD ED   06/21/2017 2201 06/28/2017 1543 Full Code [id number]  Bethena Roys, MD Inpatient   04/03/2017 1617 04/11/2017 [place] Full Code [id number]  Georgette Shell, MD ED   04/03/2017 1207 04/03/2017 1617 Full Code [id number]  Renita Papa, PA-C ED   02/28/2016 1050 02/29/2016 1439 Full Code [id number]  Annita Brod, MD Inpatient   01/03/2015 1440 01/05/2015 1428 Full Code [id number]  Theodis Blaze, MD Inpatient   07/03/2014 1716 07/05/2014 1540 Full Code [id number]  Cristal Ford, DO Inpatient   06/02/2013 0029 06/06/2013 1456 Full Code [id number]  Etta Quill, DO ED   03/28/2013 1941 04/01/2013 1925 Full Code [id number]  Marybelle Killings, MD Inpatient   03/28/2013 0550 03/28/2013 1941 Full Code [id number]  Berle Mull, MD ED   03/09/2013 2235 03/11/2013 2210 Full Code [id number]  Toy Baker, MD ED   02/09/2013 0247 02/11/2013 1915 Full Code [id number]  Etta Quill, DO ED        IV Access:    Peripheral IV   Procedures and diagnostic studies:   Dg Foot Complete Left  Result Date: 12/09/2017 CLINICAL DATA:  Left foot is cold to touch with approximately 2 inch laceration that is 11-week-old. Erythema of the toes. EXAM: LEFT FOOT - COMPLETE 3+ VIEW COMPARISON:  None. FINDINGS: Osteopenic appearance of the left foot without bone destruction or fracture. There is 28 degrees of hallux valgus of the great toe.  No radiopaque foreign body. The reported 2 inch laceration of the foot is radiographically occult. Mild degenerative joint space narrowing of the tibiotalar, subtalar and midfoot articulations. Mild osteoarthritic joint space narrowing of the DIP and PIP joints of the second through fifth toes and interphalangeal joint as well as first MTP of the great toe. 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 No acute fracture, radiopaque foreign body  nor bone destruction. Electronically Signed   By: Ashley Royalty M.D.   On: 12/09/2017 16:37     Medical Consultants:    None.  Anti-Infectives:   Doxy IV  Subjective:    [name] she relates she continues to have left foot pain.  Significantly nauseated.  Objective:    Vitals:   12/09/17 1814 12/09/17 2017 12/09/17 2248 12/10/17 0523  BP: 100/87 117/85 135/69 125/78  Pulse: 88 88 82 89  Resp: (!) 22 20 20 20   Temp:   97.9 F (36.6 C) 99.4 F (37.4 C)  TempSrc:   Oral Oral  SpO2: 96% 100% 96% 100%  Weight:      Height:       No intake or output data in the 24 hours ending 12/10/17 0908 Filed Weights   12/09/17 1553  Weight: 31.8 kg    Exam: General exam: In no acute distress cachectic Respiratory system: Good air movement and clear to auscultation. Cardiovascular system: S1 & S2 heard, RRR.  Gastrointestinal system: Abdomen is nondistended, soft and nontender.  Central nervous system: Alert and oriented. No focal neurological deficits. Extremities: No pedal edema. Skin: No rashes, lesions or ulcers Psychiatry: Judgement and insight appear normal. Mood & affect appropriate.    Data Reviewed:    Labs: Basic Metabolic Panel: Recent Labs  Lab 12/09/17 1947  NA 142  K 2.8*  CL 108  CO2 24  GLUCOSE 101*  BUN 24*  CREATININE 0.72  CALCIUM 8.8*   GFR Estimated Creatinine Clearance: 35.7 mL/min (by C-G formula based on SCr of 0.72 mg/dL). Liver Function Tests: Recent Labs  Lab 12/09/17 1947  AST 15  ALT 18  ALKPHOS 59  BILITOT 0.3  PROT 6.0*  ALBUMIN 3.4*   No results for input(s): LIPASE, AMYLASE in the last 168 hours. No results for input(s): AMMONIA in the last 168 hours. Coagulation profile No results for input(s): INR, PROTIME in the last 168 hours.  CBC: Recent Labs  Lab 12/09/17 1947  WBC 11.6*  NEUTROABS 9.8*  HGB 10.7*  HCT 36.5  MCV 94.6  PLT 192   Cardiac Enzymes: No results for input(s): CKTOTAL, CKMB, CKMBINDEX,  TROPONINI in the last 168 hours. BNP (last 3 results) No results for input(s): PROBNP in the last 8760 hours. CBG: No results for input(s): GLUCAP in the last 168 hours. D-Dimer: No results for input(s): DDIMER in the last 72 hours. Hgb A1c: No results for input(s): HGBA1C in the last 72 hours. Lipid Profile: No results for input(s): CHOL, HDL, LDLCALC, TRIG, CHOLHDL, LDLDIRECT in the last 72 hours. Thyroid function studies: No results for input(s): TSH, T4TOTAL, T3FREE, THYROIDAB in the last 72 hours.  Invalid input(s): FREET3 Anemia work up: No results for input(s): VITAMINB12, FOLATE, FERRITIN, TIBC, IRON, RETICCTPCT in the last 72 hours. Sepsis Labs: Recent Labs  Lab 12/09/17 1947  WBC 11.6*   Microbiology No results found for this or any previous visit (from the past 240 hour(s)).   Medications:   . dexamethasone  4 mg Oral Q12H  . doxycycline  100  mg Oral Q12H  . enoxaparin (LOVENOX) injection  30 mg Subcutaneous QHS  . famotidine  20 mg Oral Daily  . gabapentin  300 mg Oral QID  . losartan  50 mg Oral Daily  . megestrol  20 mg Oral Daily  . mirtazapine  45 mg Oral QHS  . mometasone-formoterol  2 puff Inhalation BID  . umeclidinium bromide  1 puff Inhalation Daily   Continuous Infusions:    LOS: 0 days   [name]  [facility]   *Please refer to [email or web address], password [email or web address] to get updated schedule on who will round on this patient, as hospitalists switch teams weekly. If 7PM-7AM, please contact night-coverage at [email or web address], password [email or web address] for any overnight needs.  12/10/2017, 9:08 AM

## 2017-12-10 NOTE — Progress Notes (Signed)
Spoke with dr Aileen Fass re midline.  States okay to place any type of IV access that [facility] is able to obtain.  20G 1.88" PIV obtained.  RN notified.

## 2017-12-11 DIAGNOSIS — L03116 Cellulitis of left lower limb: Secondary | ICD-10-CM | POA: Diagnosis not present

## 2017-12-11 DIAGNOSIS — I10 Essential (primary) hypertension: Secondary | ICD-10-CM | POA: Diagnosis not present

## 2017-12-11 MED ORDER — ENSURE ENLIVE PO LIQD
237.0000 mL | Freq: Two times a day (BID) | ORAL | Status: DC
  Administered 2017-12-11 (×2): 237 mL via ORAL

## 2017-12-11 MED ORDER — OXYCODONE HCL ER 20 MG PO T12A
20.0000 mg | EXTENDED_RELEASE_TABLET | Freq: Two times a day (BID) | ORAL | Status: DC
  Administered 2017-12-11 – 2017-12-13 (×5): 20 mg via ORAL
  Filled 2017-12-11 (×5): qty 1

## 2017-12-11 MED ORDER — GUAIFENESIN-DM 100-10 MG/5ML PO SYRP
5.0000 mL | ORAL_SOLUTION | ORAL | Status: DC | PRN
  Administered 2017-12-11: 5 mL via ORAL
  Filled 2017-12-11: qty 10

## 2017-12-11 NOTE — Progress Notes (Addendum)
[facility] PROGRESS NOTE    Progress Note  [name]  WEX:[id number] DOB: 26-Mar-1950 DOA: 12/09/2017 PCP: Azzie Glatter, FNP     Brief Narrative:   [name] is an 67 y.o. 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 She has a mild elevation in her leukocytosis we will repeat a CBC in the morning. Change oral doxycycline to IV as she is nauseated. Awaiting physical therapy and social worker to evaluate the patient.  Possible skilled nursing facility placement.  Hypokalemia: Replete orally  COPD GOLD III with reversible component  Continues to be stable continue inhalers.  Essential  HTN (hypertension), malignant Continue Cozaar blood pressure stable.  Left-sided hemiplegia with brain mets: Physical therapy has been consulted continue radiation therapy.  Metastatic adenocarcinoma of unknown origin: Continue dexamethasone follow-up with oncology as an outpatient.  RN Pressure Injury Documentation:   Severe protein caloric malnutrition: Estimated body mass index is 12.02 kg/m as calculated from the following:   Height as of this encounter: 5\' 4"  (1.626 m).   Weight as of this encounter: 31.8 kg. Ensure TID      DVT prophylaxis: lovenxo Family Communication:husband Disposition Plan/Barrier to D/C: unable to detemrine Code Status:     Code Status Orders  (From admission, onward)         Start     Ordered   12/10/17 0015  Full code  Continuous     12/10/17 0015        Code Status History    Date Active Date Inactive Code Status Order ID Comments User Context   11/21/2017 1554 11/23/2017 1413 Full  Code [id number]  Annita Brod, MD Inpatient   10/23/2017 2230 10/25/2017 1942 Full Code [id number]  Eugenie Filler, MD Inpatient   09/17/2017 1540 09/24/2017 1724 Full Code [id number]  Nita Sells, MD ED   06/21/2017 2201 06/28/2017 1543 Full Code [id number]  Bethena Roys, MD Inpatient   04/03/2017 1617 04/11/2017 [place] Full Code [id number]  Georgette Shell, MD ED   04/03/2017 1207 04/03/2017 1617 Full Code [id number]  Renita Papa, PA-C ED   02/28/2016 1050 02/29/2016 1439 Full Code [id number]  Annita Brod, MD Inpatient   01/03/2015 1440 01/05/2015 1428 Full Code [id number]  Theodis Blaze, MD Inpatient   07/03/2014 1716 07/05/2014 1540 Full Code [id number]  Cristal Ford, DO Inpatient   06/02/2013 0029 06/06/2013 1456 Full Code [id number]  Etta Quill, DO ED   03/28/2013 1941 04/01/2013 1925 Full Code [id number]  Marybelle Killings, MD Inpatient   03/28/2013 0550 03/28/2013 1941 Full Code [id number]  Berle Mull, MD ED   03/09/2013 2235 03/11/2013 2210 Full Code [id number]  Toy Baker, MD ED   02/09/2013 0247 02/11/2013 1915 Full Code [id number]  Etta Quill, DO ED        IV Access:    Peripheral IV   Procedures and diagnostic studies:   Dg Foot Complete Left  Result Date: 12/09/2017 CLINICAL DATA:  Left foot is cold to touch with approximately 2 inch laceration that is 21-week-old. Erythema of the toes. EXAM: LEFT FOOT - COMPLETE 3+ VIEW COMPARISON:  None. FINDINGS: Osteopenic appearance of the left  foot without bone destruction or fracture. There is 28 degrees of hallux valgus of the great toe. No radiopaque foreign body. The reported 2 inch laceration of the foot is radiographically occult. Mild degenerative joint space narrowing of the tibiotalar, subtalar and midfoot articulations. Mild osteoarthritic joint space narrowing of the DIP and PIP joints of the second through fifth toes and interphalangeal joint as well as first MTP of the great toe. 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 No acute fracture, radiopaque foreign body nor bone destruction. Electronically Signed   By: Ashley Royalty M.D.   On: 12/09/2017 16:37     Medical Consultants:    None.  Anti-Infectives:   Doxy IV  Subjective:    [name] she relates she continues to have left foot pain.  Pain not controlled.  Objective:    Vitals:   12/10/17 1754 12/10/17 2010 12/11/17 0507 12/11/17 0818  BP:  (!) 149/78 124/84   Pulse: 88 84 76   Resp:  16 16   Temp:  98.5 F (36.9 C) 97.9 F (36.6 C)   TempSrc:  Oral Oral   SpO2:  100% 100% 99%  Weight:      Height:        Intake/Output Summary (Last 24 hours) at 12/11/2017 0929 Last data filed at 12/11/2017 0600 Gross per 24 hour  Intake 240 ml  Output -  Net 240 ml   Filed Weights   12/09/17 1553  Weight: 31.8 kg    Exam: General exam: Cachectic appearing female in no acute distress Respiratory system: Good air movement and clear to auscultation. Cardiovascular system: S1 & S2 heard, RRR.  Gastrointestinal system: Bowel sounds, soft nontender nondistended Central nervous system: Alert and oriented x3 nonfocal Extremities: No pedal edema. Skin: Ulceration, with erythema which is improving. Psychiatry: Mood and insight appear normal   Data Reviewed:    Labs: Basic Metabolic Panel: Recent Labs  Lab 12/09/17 1947 12/10/17 1617  NA 142 137  K 2.8* 4.0  CL 108 110  CO2 24 14*  GLUCOSE 101* 297*  BUN 24* 27*  CREATININE 0.72 0.92  CALCIUM 8.8* 8.1*   GFR Estimated Creatinine Clearance: 31 mL/min (by C-G formula based on SCr of 0.92 mg/dL). Liver Function Tests: Recent Labs  Lab 12/09/17 1947  AST 15  ALT 18  ALKPHOS 59  BILITOT 0.3  PROT 6.0*  ALBUMIN 3.4*   No results for input(s): LIPASE, AMYLASE in the last 168 hours. No results for input(s): AMMONIA in the last 168 hours. Coagulation profile No results for input(s): INR, PROTIME in the last 168  hours.  CBC: Recent Labs  Lab 12/09/17 1947 12/10/17 1617  WBC 11.6* 12.9*  NEUTROABS 9.8*  --   HGB 10.7* 9.8*  HCT 36.5 31.4*  MCV 94.6 88.2  PLT 192 178   Cardiac Enzymes: No results for input(s): CKTOTAL, CKMB, CKMBINDEX, TROPONINI in the last 168 hours. BNP (last 3 results) No results for input(s): PROBNP in the last 8760 hours. CBG: No results for input(s): GLUCAP in the last 168 hours. D-Dimer: No results for input(s): DDIMER in the last 72 hours. Hgb A1c: No results for input(s): HGBA1C in the last 72 hours. Lipid Profile: No results for input(s): CHOL, HDL, LDLCALC, TRIG, CHOLHDL, LDLDIRECT in the last 72 hours. Thyroid function studies: No results for input(s): TSH, T4TOTAL, T3FREE, THYROIDAB in the last 72 hours.  Invalid input(s): FREET3 Anemia work up: No results for input(s): VITAMINB12, FOLATE, FERRITIN, TIBC,  IRON, RETICCTPCT in the last 72 hours. Sepsis Labs: Recent Labs  Lab 12/09/17 1947 12/10/17 1617  WBC 11.6* 12.9*   Microbiology No results found for this or any previous visit (from the past 240 hour(s)).   Medications:   . dexamethasone  4 mg Oral Q12H  . enoxaparin (LOVENOX) injection  30 mg Subcutaneous QHS  . famotidine  20 mg Oral Daily  . feeding supplement (ENSURE ENLIVE)  237 mL Oral BID BM  . gabapentin  300 mg Oral QID  . losartan  50 mg Oral Daily  . megestrol  200 mg Oral Daily  . mirtazapine  45 mg Oral QHS  . mometasone-formoterol  2 puff Inhalation BID  . oxyCODONE  10 mg Oral Q12H  . polyethylene glycol  17 g Oral Daily  . umeclidinium bromide  1 puff Inhalation Daily   Continuous Infusions:    LOS: 0 days   Charlynne Cousins  [facility]   *Please refer to [email or web address], password [email or web address] to get updated schedule on who will round on this patient, as hospitalists switch teams weekly. If 7PM-7AM, please contact night-coverage at [email or web address], password [email or web address] for any overnight needs.  12/11/2017, 9:29 AM

## 2017-12-11 NOTE — Evaluation (Signed)
Physical Therapy Evaluation Patient Details Name: [name] MRN: [id number] DOB: [birth year] Today's Date: 12/11/2017   History of Present Illness  [name] is an 67 y.o. 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 According to the chart and after talking with pt's husband , Ronalee Belts, 2 weeks ago pt was able to stand pivot on RLE and pivot to a BSC beside the couch. However in the last week , and the HHPT agrees, her weakness in her trunk and L side has progressed resulting in increased weakness to complete flaccid on the L side. This requires 24/7 assist and Mod/Max Assist for tranfers and sitting balance. She is very pleasant and tries hard. Today was limited due to fatigue and pt was nauseated , which is not her norm. Pt to benefit from ST-SNF to see if radiation is helping to decreased hemiplegia and strengthen pt overall to be able to assist with basic sitting and tranfers. Will continue to follow while in hospital.      Follow Up Recommendations SNF    Equipment Recommendations  (defer to next location, bc pt will eventually need WC, etc when she goes home )    Recommendations for Other Services       Precautions / Restrictions Restrictions Weight Bearing Restrictions: No      Mobility  Bed Mobility Overal bed mobility: Needs Assistance Bed Mobility: Supine to Sit;Sit to Supine     Supine to sit: Mod assist;Max assist Sit to supine: Max assist   General bed mobility comments: maX for mobility at times able to assist with R side of body and R LE and R UE . verbal cues and tactile cues as well.  supported LUE due to flaccid nature.   Transfers Overall transfer level: Needs assistance Equipment used: None(assisted from front for lift off to stand )   Sit to Stand: Mod assist;Max assist(sit to stand with therapist assist from front to lift off to static stand from bed. supported LUE and LLE due to flaccid nature. pt able to utilize RLE as much as possible to stand. Fatigued quickly. 10 second stand. )            Ambulation/Gait Ambulation/Gait assistance: (unable )              Stairs            Wheelchair Mobility    Modified Rankin (Stroke Patients Only)       Balance Overall balance assessment: Needs assistance Sitting-balance support: Single extremity supported Sitting balance-Leahy Scale: Poor Sitting balance - Comments: very weak in trunk needing constant assist from Min/Max only able to tolerate for about 4-5 minutes today . Pt Worked hard to sit EOB today.      Standing balance-Leahy Scale: Poor                               Pertinent Vitals/Pain Pain Score: 5  Pain Location: L Lateral leg area, and Left foot  Pain Descriptors / Indicators: Sore;Aching Pain Intervention(s): Limited activity within patient's tolerance;Monitored during session  Home Living Family/patient expects to be discharged to:: Skilled nursing facility Living Arrangements: Spouse/significant other Available Help at Discharge: Family Type of Home: House Home Access: Stairs to enter Entrance Stairs-Rails: Right Entrance Stairs-Number of Steps: 5 Home Layout: One level Home Equipment: [place] - 2 wheels;Bedside commode;Walker - 4 wheels Additional Comments: husband has to go back to work and unable to assist her with how much assist she is taking in the last week     Prior Function Level of Independence: Needs assistance   Gait / Transfers Assistance Needed: prior to brain tumor, pt was independent. for last month, pt's husband has been placing pt on  couch and a BSC beside couch and she could pivot on RLE to [facility] until last week pt then could not even sit independently, or transfer requiring full 24/7 assist at Mod/MaxA. Spoke to husband, Ronalee Belts, and he is not able to assit at this level, very difficult for him to care for his wife at this time for th epast 1 week.            Hand Dominance        Extremity/Trunk Assessment   Upper Extremity Assessment Upper Extremity Assessment: Generalized weakness(LUE flaccid, has trace 2-/5 for elbow flexion and extension, other movment 0/5)    Lower Extremity Assessment Lower Extremity Assessment: Generalized weakness RLE Deficits / Details: 3/5 hip flexion/extension, 3+/5 hip adduction/abduction, 3-/5 knee extension, 3/5 DF/PF LLE Deficits / Details: 0/5 for all movement except for trace 1+/5 for hip flexion and extension     Cervical / Trunk Assessment Cervical / Trunk Assessment: Other exceptions Cervical / Trunk Exceptions: trunk weakness with some muscle activity, but unable to hold upright static sitting indepedently, required min/maxA to statically hold. tolerated sitting balance about 2 minutes before greatly fatigued .   Communication      Cognition Arousal/Alertness: Awake/alert Behavior During Therapy: WFL for tasks assessed/performed Overall Cognitive Status: Within Functional Limits for tasks assessed                                        General Comments      Exercises     Assessment/Plan    PT Assessment Patient needs continued PT services  PT Problem List Decreased range of motion;Decreased strength;Decreased activity tolerance;Decreased balance;Decreased mobility;Decreased knowledge of precautions       PT Treatment Interventions Therapeutic activities;Therapeutic exercise;Balance training;Functional mobility training;Neuromuscular re-education;Patient/family education    PT Goals (Current goals can be found in the Care Plan section)  Acute  Rehab PT Goals Patient Stated Goal: I don't like how this brain thing has messed me up  PT Goal Formulation: With patient Time For Goal Achievement: 12/25/17 Potential to Achieve Goals: Fair    Frequency Min 3X/week   Barriers to discharge        Co-evaluation               AM-PAC PT "6 Clicks" Mobility  Outcome Measure Help needed turning from your back to your side while in a flat bed without using bedrails?: A Lot Help needed moving from lying on your back to sitting on the side of a flat bed without using bedrails?: A Lot Help needed moving to and from a bed to a chair (including a wheelchair)?: Total Help needed standing up from a chair using your arms (e.g., wheelchair or bedside chair)?: Total Help needed to walk in  hospital room?: Total Help needed climbing 3-5 steps with a railing? : Total 6 Click Score: 8    End of Session Equipment Utilized During Treatment: Oxygen Activity Tolerance: Patient limited by fatigue(pt feelign very nauseated today ) Patient left: in bed;with bed alarm set Nurse Communication: Mobility status PT Visit Diagnosis: Muscle weakness (generalized) (M62.81);Hemiplegia and hemiparesis;Unsteadiness on feet (R26.81)    Time: 7673-4193 PT Time Calculation (min) (ACUTE ONLY): 31 min   Charges:   PT Evaluation $PT Eval Moderate Complexity: 1 Mod PT Treatments $Therapeutic Activity: 8-22 mins        Clide Dales, PT Acute Rehabilitation Services Pager: [phone] Office: [phone] 12/11/2017   Clide Dales 12/11/2017, 2:19 PM

## 2017-12-12 DIAGNOSIS — Z681 Body mass index (BMI) 19 or less, adult: Secondary | ICD-10-CM | POA: Diagnosis not present

## 2017-12-12 DIAGNOSIS — Z8249 Family history of ischemic heart disease and other diseases of the circulatory system: Secondary | ICD-10-CM | POA: Diagnosis not present

## 2017-12-12 DIAGNOSIS — F1120 Opioid dependence, uncomplicated: Secondary | ICD-10-CM | POA: Diagnosis present

## 2017-12-12 DIAGNOSIS — E43 Unspecified severe protein-calorie malnutrition: Secondary | ICD-10-CM | POA: Diagnosis present

## 2017-12-12 DIAGNOSIS — Z825 Family history of asthma and other chronic lower respiratory diseases: Secondary | ICD-10-CM | POA: Diagnosis not present

## 2017-12-12 DIAGNOSIS — D630 Anemia in neoplastic disease: Secondary | ICD-10-CM

## 2017-12-12 DIAGNOSIS — Z79899 Other long term (current) drug therapy: Secondary | ICD-10-CM | POA: Diagnosis not present

## 2017-12-12 DIAGNOSIS — Z801 Family history of malignant neoplasm of trachea, bronchus and lung: Secondary | ICD-10-CM | POA: Diagnosis not present

## 2017-12-12 DIAGNOSIS — F17200 Nicotine dependence, unspecified, uncomplicated: Secondary | ICD-10-CM

## 2017-12-12 DIAGNOSIS — Z515 Encounter for palliative care: Secondary | ICD-10-CM | POA: Diagnosis not present

## 2017-12-12 DIAGNOSIS — C801 Malignant (primary) neoplasm, unspecified: Secondary | ICD-10-CM | POA: Diagnosis not present

## 2017-12-12 DIAGNOSIS — Z903 Acquired absence of stomach [part of]: Secondary | ICD-10-CM | POA: Diagnosis not present

## 2017-12-12 DIAGNOSIS — K219 Gastro-esophageal reflux disease without esophagitis: Secondary | ICD-10-CM | POA: Diagnosis present

## 2017-12-12 DIAGNOSIS — Z66 Do not resuscitate: Secondary | ICD-10-CM

## 2017-12-12 DIAGNOSIS — F1320 Sedative, hypnotic or anxiolytic dependence, uncomplicated: Secondary | ICD-10-CM | POA: Diagnosis present

## 2017-12-12 DIAGNOSIS — L03116 Cellulitis of left lower limb: Secondary | ICD-10-CM | POA: Diagnosis present

## 2017-12-12 DIAGNOSIS — G894 Chronic pain syndrome: Secondary | ICD-10-CM

## 2017-12-12 DIAGNOSIS — Z765 Malingerer [conscious simulation]: Secondary | ICD-10-CM | POA: Diagnosis not present

## 2017-12-12 DIAGNOSIS — S91312S Laceration without foreign body, left foot, sequela: Secondary | ICD-10-CM | POA: Diagnosis not present

## 2017-12-12 DIAGNOSIS — F17210 Nicotine dependence, cigarettes, uncomplicated: Secondary | ICD-10-CM | POA: Diagnosis present

## 2017-12-12 DIAGNOSIS — J449 Chronic obstructive pulmonary disease, unspecified: Secondary | ICD-10-CM | POA: Diagnosis present

## 2017-12-12 DIAGNOSIS — R627 Adult failure to thrive: Secondary | ICD-10-CM | POA: Diagnosis present

## 2017-12-12 DIAGNOSIS — K860 Alcohol-induced chronic pancreatitis: Secondary | ICD-10-CM | POA: Diagnosis present

## 2017-12-12 DIAGNOSIS — Z811 Family history of alcohol abuse and dependence: Secondary | ICD-10-CM | POA: Diagnosis not present

## 2017-12-12 DIAGNOSIS — F419 Anxiety disorder, unspecified: Secondary | ICD-10-CM | POA: Diagnosis present

## 2017-12-12 DIAGNOSIS — C7931 Secondary malignant neoplasm of brain: Secondary | ICD-10-CM | POA: Diagnosis present

## 2017-12-12 DIAGNOSIS — E876 Hypokalemia: Secondary | ICD-10-CM | POA: Diagnosis present

## 2017-12-12 DIAGNOSIS — Z85528 Personal history of other malignant neoplasm of kidney: Secondary | ICD-10-CM | POA: Diagnosis not present

## 2017-12-12 DIAGNOSIS — I10 Essential (primary) hypertension: Secondary | ICD-10-CM | POA: Diagnosis present

## 2017-12-12 LAB — CBC
HCT: 32.3 % — ABNORMAL LOW (ref 36.0–46.0)
Hemoglobin: 10.5 g/dL — ABNORMAL LOW (ref 12.0–15.0)
MCH: 28.3 pg (ref 26.0–34.0)
MCHC: 32.5 g/dL (ref 30.0–36.0)
MCV: 87.1 fL (ref 80.0–100.0)
Platelets: 202 10*3/uL (ref 150–400)
RBC: 3.71 MIL/uL — ABNORMAL LOW (ref 3.87–5.11)
RDW: 17.4 % — ABNORMAL HIGH (ref 11.5–15.5)
WBC: 23.7 10*3/uL — ABNORMAL HIGH (ref 4.0–10.5)
nRBC: 0.5 % — ABNORMAL HIGH (ref 0.0–0.2)

## 2017-12-12 MED ORDER — DEXAMETHASONE 4 MG PO TABS
8.0000 mg | ORAL_TABLET | Freq: Two times a day (BID) | ORAL | Status: DC
  Administered 2017-12-12 – 2017-12-13 (×2): 8 mg via ORAL
  Filled 2017-12-12 (×2): qty 2

## 2017-12-12 MED ORDER — DIAZEPAM 5 MG PO TABS
5.0000 mg | ORAL_TABLET | Freq: Four times a day (QID) | ORAL | Status: DC | PRN
  Administered 2017-12-12 – 2017-12-13 (×5): 5 mg via ORAL
  Filled 2017-12-12 (×5): qty 1

## 2017-12-12 NOTE — Progress Notes (Addendum)
CSW consulted for assistance with disposition. Pt and family well known to CSW from previous encounters- pt with COPD, metastatic lung cancer (brain mets). Pt has history of substance use issues as well.  Pt has been unable to place in SNF many times in the past due to various barriers (has Medicare part B but not part A, Medicaid only for SNF coverage, drug seeking behavior, opioid pain management regimen).  Per previous CSW, husband has requested PMT consult.  CSW will follow. Have made referrals to SNFs and will complete FL2.   Sharren Bridge, MSW, LCSW Clinical Social Work 12/12/2017 [phone]

## 2017-12-12 NOTE — Progress Notes (Addendum)
[facility] PROGRESS NOTE    Progress Note  ANDREY HOOBLER  OFB:[id number] DOB: [birth year] DOA: 12/09/2017 PCP: Azzie Glatter, FNP     Brief Narrative:   ADRIA COSTLEY is an 67 y.o. 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 Worsening leukocytosis likely due to steroids, cont doxy orally. She has remained afebrile her foot looks a lot better. We will involve case manager to have palliative care follow-up at facility. After meeting with palliative care the family decided to move towards no aggressive measures. They would like her pain control. Oncology evaluated the patient recommending no chemo or radiation therapy.  Hypokalemia: Replete orally  COPD GOLD III with reversible component  Continues to be stable continue inhalers.  Essential  HTN (hypertension), malignant Continue Cozaar blood pressure stable.  Left-sided hemiplegia with brain mets: Oncology evaluated the patient and they recommended no further chemo or radiation therapy. Continue Decadron.  Metastatic adenocarcinoma of unknown origin: Continue dexamethasone follow-up with oncology as an outpatient.  RN Pressure Injury Documentation:   Severe protein caloric malnutrition: Estimated body mass index is 12.02 kg/m as calculated from the following:   Height as of this encounter: 5\' 4"  (1.626 m).   Weight as of this encounter: 31.8 kg. Ensure TID      DVT prophylaxis: lovenxo Family Communication:husband Disposition Plan/Barrier to D/C: unable to detemrine Code Status:     Code Status Orders  (From admission, onward)         Start     Ordered   12/10/17 0015  Full code  Continuous     12/10/17 0015        Code Status History    Date Active Date Inactive Code Status Order ID Comments User Context   11/21/2017 1554 11/23/2017 1413 Full Code [id number]  Annita Brod, MD Inpatient   10/23/2017 2230 10/25/2017 1942 Full Code [id number]  Eugenie Filler, MD Inpatient   09/17/2017 1540 09/24/2017 1724 Full Code [id number]  Nita Sells, MD ED   06/21/2017 2201 06/28/2017 1543 Full Code [id number]  Bethena Roys, MD Inpatient   04/03/2017 1617 04/11/2017 1835 Full Code [id number]  Georgette Shell, MD ED   04/03/2017 1207 04/03/2017 1617 Full Code [id number]  Renita Papa, PA-C ED   02/28/2016 1050 02/29/2016 1439 Full Code [id number]  Annita Brod, MD Inpatient   01/03/2015 1440 01/05/2015 1428 Full Code [id number]  Theodis Blaze, MD Inpatient   07/03/2014 1716 07/05/2014 1540 Full Code [id number]  Cristal Ford, DO Inpatient   06/02/2013 0029 06/06/2013 1456 Full Code [id number]  Etta Quill, DO ED   03/28/2013 1941 04/01/2013 1925 Full Code [id number]  Marybelle Killings, MD Inpatient   03/28/2013 0550 03/28/2013 1941 Full Code [id number]  Berle Mull, MD ED   03/09/2013 2235 03/11/2013 2210 Full Code [id number]  Toy Baker, MD ED   02/09/2013 0247 02/11/2013 1915 Full Code [id number]  Etta Quill, DO ED        IV Access:    Peripheral IV   Procedures and diagnostic studies:   No results found.   Medical Consultants:    None.  Anti-Infectives:   Doxy IV  Subjective:  [name] relates her pain is not controlled.  Objective:    Vitals:   12/11/17 2115 12/11/17 2133 12/12/17 0437 12/12/17 0533  BP:  114/68 (!) 151/94 (!) 147/77  Pulse:  70 89   Resp:  17 16   Temp:  98.7 F (37.1 C) 98.1 F (36.7 C)   TempSrc:  Oral Oral   SpO2: 100% 98% 100%   Weight:      Height:        Intake/Output Summary (Last 24 hours) at 12/12/2017 0757 Last data filed at 12/11/2017 1055 Gross per 24  hour  Intake 240 ml  Output -  Net 240 ml   Filed Weights   12/09/17 1553  Weight: 31.8 kg    Exam: General exam: Cachectic appearing female in no acute distress Respiratory system: Good air movement and clear to auscultation. Cardiovascular system: S1 & S2 heard, RRR.  Gastrointestinal system: Bowel sounds, soft nontender nondistended Central nervous system: Alert and oriented x3 nonfocal Extremities: No pedal edema. Skin: Ulceration, with erythema which is improving. Psychiatry: Mood and insight appear normal   Data Reviewed:    Labs: Basic Metabolic Panel: Recent Labs  Lab 12/09/17 1947 12/10/17 1617  NA 142 137  K 2.8* 4.0  CL 108 110  CO2 24 14*  GLUCOSE 101* 297*  BUN 24* 27*  CREATININE 0.72 0.92  CALCIUM 8.8* 8.1*   GFR Estimated Creatinine Clearance: 31 mL/min (by C-G formula based on SCr of 0.92 mg/dL). Liver Function Tests: Recent Labs  Lab 12/09/17 1947  AST 15  ALT 18  ALKPHOS 59  BILITOT 0.3  PROT 6.0*  ALBUMIN 3.4*   No results for input(s): LIPASE, AMYLASE in the last 168 hours. No results for input(s): AMMONIA in the last 168 hours. Coagulation profile No results for input(s): INR, PROTIME in the last 168 hours.  CBC: Recent Labs  Lab 12/09/17 1947 12/10/17 1617 12/12/17 0457  WBC 11.6* 12.9* 23.7*  NEUTROABS 9.8*  --   --   HGB 10.7* 9.8* 10.5*  HCT 36.5 31.4* 32.3*  MCV 94.6 88.2 87.1  PLT 192 178 202   Cardiac Enzymes: No results for input(s): CKTOTAL, CKMB, CKMBINDEX, TROPONINI in the last 168 hours. BNP (last 3 results) No results for input(s): PROBNP in the last 8760 hours. CBG: No results for input(s): GLUCAP in the last 168 hours. D-Dimer: No results for input(s): DDIMER in the last 72 hours. Hgb A1c: No results for input(s): HGBA1C in the last 72 hours. Lipid Profile: No results for input(s): CHOL, HDL, LDLCALC, TRIG, CHOLHDL, LDLDIRECT in the last 72 hours. Thyroid function studies: No results for input(s):  TSH, T4TOTAL, T3FREE, THYROIDAB in the last 72 hours.  Invalid input(s): FREET3 Anemia work up: No results for input(s): VITAMINB12, FOLATE, FERRITIN, TIBC, IRON, RETICCTPCT in the last 72 hours. Sepsis Labs: Recent Labs  Lab 12/09/17 1947 12/10/17 1617 12/12/17 0457  WBC 11.6* 12.9* 23.7*   Microbiology No results found for this or any previous visit (from the past 240 hour(s)).   Medications:   . dexamethasone  4 mg Oral Q12H  . enoxaparin (LOVENOX) injection  30 mg Subcutaneous QHS  . famotidine  20 mg Oral Daily  . feeding supplement (ENSURE ENLIVE)  237 mL Oral BID BM  . gabapentin  300 mg Oral QID  . losartan  50 mg Oral Daily  . megestrol  200 mg Oral Daily  . mirtazapine  45 mg Oral QHS  . mometasone-formoterol  2 puff  Inhalation BID  . oxyCODONE  20 mg Oral Q12H  . polyethylene glycol  17 g Oral Daily  . umeclidinium bromide  1 puff Inhalation Daily   Continuous Infusions:    LOS: 0 days   Charlynne Cousins  [facility]   *Please refer to [email or web address], password [email or web address] to get updated schedule on who will round on this patient, as hospitalists switch teams weekly. If 7PM-7AM, please contact night-coverage at [email or web address], password [email or web address] for any overnight needs.  12/12/2017, 7:57 AM

## 2017-12-12 NOTE — Consult Note (Signed)
Consultation Note Date: 12/12/2017   Patient Name: [name]  DOB: May 15, 1950  MRN: [id number]  Age / Sex: 67 y.o., female  PCP: Azzie Glatter, FNP Referring Physician: Charlynne Cousins, MD  Reason for Consultation: Establishing goals of care and Psychosocial/spiritual support  HPI/Patient Profile: 67 y.o. female admitted on 12/09/2017 with progressive left-sided weakness.  Past medical history significant for left lung mass/CT biopsy 10/10/2017 adenocarcinoma.,  COPD, pancreatitis, chronic pain syndrome, partial gastrectomy, ongoing tobacco use and left renal cell carcinoma in 2009   She completed SRS treatment to right frontal metastatic lesion on 12/08/2017.  Per oncology she is not a candidate for systemic therapy secondary to poor performance status.  Oncology is recommending hospice.  Per her husband patient has had continued physical, functional and cognitive decline over the past 6 months.  However he tells me that she has been sickly  all of their years of marriage.    Patient and family face advance directive decisions and anticipatory care needs.  Clinical Assessment and Goals of Care:  This NP Wadie Lessen reviewed medical records, received report from team, assessed the patient and then meet at the patient's bedside along with her husband  to discuss diagnosis, prognosis, GOC, EOL wishes disposition and options.  We discussed the limitations of medical interventions when the body begins to fail to thrive and concept of human mortality.  Concept of Hospice and Palliative Care were discussed  A detailed discussion was had today regarding advanced directives.  Concepts specific to code status, artifical feeding and hydration, continued IV antibiotics and rehospitalization was had.  The difference between a aggressive medical intervention path  and a palliative comfort care path for this  patient at this time was had.  Values and goals of care important to patient and family were attempted to be elicited.  MOST form completed  Natural trajectory and expectations at EOL were discussed.  Questions and concerns addressed.   Family encouraged to call with questions or concerns.    PMT will continue to support holistically.    NEXT OF KIN/husband    SUMMARY OF RECOMMENDATIONS    Code Status/Advance Care Planning:  DNR-documented today     Symptom Management:   Chronic pain: Continue on current medications prescribed by attending  Palliative Prophylaxis:   Aspiration, Bowel Regimen, Delirium Protocol, Frequent Pain Assessment and Oral Care  Additional Recommendations (Limitations, Scope, Preferences):  Full Comfort Care  Psycho-social/Spiritual:   Desire for further Chaplaincy support:yes  Additional Recommendations: Education on Hospice   Emotional support offered.  Created space and opportunity for her husband to explore his thoughts and feelings regarding current medical situation.  Prognosis:   < 6 months  Discharge Planning: [place] with Hospice      Primary Diagnoses: Present on Admission: . Cellulitis of left foot . Metastatic adenocarcinoma to brain with unknown primary site [facility]) . HTN (hypertension), malignant . COPD GOLD III with reversible component  . Protein-calorie malnutrition, severe ([place])   I have reviewed the medical  record, interviewed the patient and family, and examined the patient. The following aspects are pertinent.  Past Medical History:  Diagnosis Date  . Cancer ([place])    renal ca  . COPD (chronic obstructive pulmonary disease) ([place])   . Drug-seeking behavior   . Pancreatitis   . Pancreatitis   . Seizures ([place])   . Substance abuse ([place])    Social History   Socioeconomic History  . Marital status: Married    Spouse name: Not on file  . Number of children: Not on file  . Years of education:  Not on file  . Highest education level: Not on file  Occupational History  . Not on file  Social Needs  . Financial resource strain: Not on file  . Food insecurity:    Worry: Not on file    Inability: Not on file  . Transportation needs:    Medical: No    Non-medical: No  Tobacco Use  . Smoking status: Current Every Day Smoker    Packs/day: 0.50    Years: 30.00    Pack years: 15.00    Types: Cigarettes  . Smokeless tobacco: Never Used  . Tobacco comment: smoking up to 1.5 ppd per husband. down from 3pk day to 1.5   Substance and Sexual Activity  . Alcohol use: Not Currently    Alcohol/week: 0.0 standard drinks    Comment: "I stopped drinking 1 year ago"  . Drug use: No    Comment: Hx of polysubstance drug abuse  . Sexual activity: Never  Lifestyle  . Physical activity:    Days per week: Not on file    Minutes per session: Not on file  . Stress: Not on file  Relationships  . Social connections:    Talks on phone: Not on file    Gets together: Not on file    Attends religious service: Not on file    Active member of club or organization: Not on file    Attends meetings of clubs or organizations: Not on file    Relationship status: Not on file  Other Topics Concern  . Not on file  Social History Narrative  . Not on file   Family History  Problem Relation Age of Onset  . CAD Mother   . Alcoholism Father   . COPD Father   . Lung cancer Father   . Rectal cancer Maternal Uncle    Scheduled Meds: . dexamethasone  4 mg Oral Q12H  . enoxaparin (LOVENOX) injection  30 mg Subcutaneous QHS  . famotidine  20 mg Oral Daily  . feeding supplement (ENSURE ENLIVE)  237 mL Oral BID BM  . gabapentin  300 mg Oral QID  . losartan  50 mg Oral Daily  . megestrol  200 mg Oral Daily  . mirtazapine  45 mg Oral QHS  . mometasone-formoterol  2 puff Inhalation BID  . oxyCODONE  20 mg Oral Q12H  . polyethylene glycol  17 g Oral Daily  . umeclidinium bromide  1 puff Inhalation Daily     Continuous Infusions: PRN Meds:.acetaminophen **OR** acetaminophen, diazepam, guaiFENesin-dextromethorphan, ondansetron **OR** ondansetron (ZOFRAN) IV, oxyCODONE Medications Prior to Admission:  Prior to Admission medications   Medication Sig Start Date End Date Taking? Authorizing Provider  dexamethasone (DECADRON) 4 MG tablet Take 1 tablet (4 mg total) by mouth every 12 (twelve) hours. 11/23/17 12/23/17 Yes Adhikari, Tamsen Meek, MD  DULERA 200-5 MCG/ACT AERO Inhale 2 puffs into the lungs 2 (two) times daily. 11/08/17  Yes [provider]  famotidine (PEPCID) 20 MG tablet Take 1 tablet (20 mg total) by mouth daily. Patient taking differently: Take 20 mg by mouth daily as needed for heartburn or indigestion.  08/28/17  Yes Khatri, Hina, PA-C  gabapentin (NEURONTIN) 300 MG capsule Take 300 mg by mouth 4 (four) times daily.  12/05/17  Yes [provider]  ipratropium-albuterol (DUONEB) 0.5-2.5 (3) MG/3ML SOLN Take 3 mLs by nebulization every 6 (six) hours as needed. Use 3 times daily x 4 days, then every 6 hours as needed. Patient taking differently: Take 3 mLs by nebulization every 6 (six) hours as needed (sob and wheezing).  10/25/17  Yes Eugenie Filler, MD  LORazepam (ATIVAN) 1 MG tablet Take 1 tablet (1 mg total) by mouth every 8 (eight) hours as needed for anxiety. 10/25/17  Yes Eugenie Filler, MD  losartan (COZAAR) 50 MG tablet Take 50 mg by mouth daily.   Yes [provider]  megestrol (MEGACE) 20 MG tablet Take 1 tablet (20 mg total) by mouth daily. 11/18/17  Yes Azzie Glatter, FNP  mirtazapine (REMERON) 45 MG tablet Take 45 mg by mouth at bedtime.  12/05/17  Yes [provider]  Oxycodone HCl 10 MG TABS Take 10 mg by mouth 4 (four) times daily as needed (pain).  11/28/17  Yes [provider]  PROAIR HFA 108 (90 Base) MCG/ACT inhaler Inhale 1 puff into the lungs every 4 (four) hours as needed for wheezing or shortness of breath. 11/01/17  Yes  Azzie Glatter, FNP  tiotropium (SPIRIVA HANDIHALER) 18 MCG inhalation capsule PLACE 1 CAPSULE INTO INHALER AND INHALE DAILY Patient taking differently: Place 18 mcg into inhaler and inhale daily.  10/29/17  Yes Eugenie Filler, MD  feeding supplement, ENSURE ENLIVE, (ENSURE ENLIVE) LIQD Take 237 mLs by mouth 3 (three) times daily between meals. Patient not taking: Reported on 12/09/2017 10/25/17   Eugenie Filler, MD  ondansetron (ZOFRAN) 4 MG tablet Take 1 tablet (4 mg total) by mouth daily as needed. Patient not taking: Reported on 12/09/2017 11/09/17   Azzie Glatter, FNP  oxyCODONE-acetaminophen (PERCOCET) 5-325 MG tablet Take 1-2 tablets by mouth every 6 (six) hours as needed. Patient not taking: Reported on 12/09/2017 11/14/17   Veryl Speak, MD  polyethylene glycol (MIRALAX / GLYCOLAX) packet Take 17 g by mouth daily as needed for mild constipation. 11/23/17 12/23/17  Shelly Coss, MD   Allergies  Allergen Reactions  . Aspirin Other (See Comments)    Break out in welts  . Chlorpromazine Hcl Other (See Comments)    Muscle spasms - "really bad"   Review of Systems  Musculoskeletal: Positive for back pain.  Neurological: Positive for weakness.    Physical Exam  Constitutional: She appears cachectic. She appears ill.  Cardiovascular: Normal rate, regular rhythm and normal heart sounds.  Pulmonary/Chest: Effort normal and breath sounds normal.  Neurological: She is alert.  Skin: Skin is warm and dry.    Vital Signs: BP (!) 147/77   Pulse 97   Temp 98.1 F (36.7 C) (Oral)   Resp 18   Ht 5\' 4"  (1.626 m)   Wt 31.8 kg   SpO2 98%   BMI 12.02 kg/m  Pain Scale: 0-10 POSS *See Group Information*: 1-Acceptable,Awake and alert Pain Score: 8    SpO2: SpO2: 98 % O2 Device:SpO2: 98 % O2 Flow Rate: .O2 Flow Rate (L/min): 3.5 L/min  IO: Intake/output summary:   Intake/Output Summary (Last 24 hours)  at 12/12/2017 1254 Last data filed at 12/12/2017 1113 Gross per 24  hour  Intake 360 ml  Output -  Net 360 ml    LBM: Last BM Date: 12/12/17 Baseline Weight: Weight: 31.8 kg Most recent weight: Weight: 31.8 kg     Palliative Assessment/Data: 30 % at best   Flowsheet Rows     Most Recent Value  Intake Tab  Referral Department  Hospitalist  Unit at Time of Referral  -- [urlogy/telementry]  Palliative Care Primary Diagnosis  Cancer  Date Notified  12/10/17  Palliative Care Type  Return patient Palliative Care  Reason for referral  End of Life Care Assistance  Date of Admission  12/09/17  # of days IP prior to Palliative referral  1  Clinical Assessment  Psychosocial & Spiritual Assessment  Palliative Care Outcomes      Discussed with Dr Venetia Constable  Time In: 1300 Time Out: 1500 Time Total: 90 minutes Greater than 50%  of this time was spent counseling and coordinating care related to the above assessment and plan.  Signed by: Wadie Lessen, NP   Please contact Palliative Medicine Team phone at [phone] for questions and concerns.  For individual provider: See Shea Evans

## 2017-12-12 NOTE — NC FL2 (Signed)
[place] MEDICAID FL2 LEVEL OF CARE SCREENING TOOL     IDENTIFICATION  Patient Name: [name] Birthdate: 06-14-1950 Sex: female Admission Date (Current Location): 12/09/2017  [facility] and [place] Number:  Herbalist and Address:  [facility],  [place], [place]      Provider Number: [id number]  Attending Physician Name and Address:  Charlynne Cousins, MD  Relative Name and Phone Number:       Current Level of Care: Hospital Recommended Level of Care: [place] Prior Approval Number:    Date Approved/Denied:   PASRR Number: [id number]  Discharge Plan: SNF    Current Diagnoses: Patient Active Problem List   Diagnosis Date Noted  . Cellulitis of left foot 12/10/2017  . Weakness 12/10/2017  . Cellulitis 12/09/2017  . Right hemiplegia ([place]) 11/21/2017  . Metastatic adenocarcinoma to brain with unknown primary site [facility]) 11/21/2017  . Lung mass 10/04/2017  . Osteoarthritis of facet joint of lumbar spine 07/26/2017  . Low back pain radiating to both legs 07/26/2017  . 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 Failure to thrive in adult 06/21/2017  . Pressure injury of skin 04/06/2017  . Pressure injury of skin of right ischial tuberosity region: Stage 2 04/06/2017  . Benzodiazepine dependence ([place])   . Narcotic addiction ([place]) 02/29/2016  . Abdominal pain, chronic, epigastric- due to chronic pancreatitis 02/29/2016  . COPD (chronic obstructive pulmonary disease) ([place]) 02/28/2016  . Cough 09/03/2015  . Abdominal pain 01/03/2015  . Hyponatremia 01/03/2015  . Hypocalcemia 01/03/2015  . Underweight 01/03/2015  . COPD GOLD III with reversible component  07/17/2014  . Chronic alcoholic pancreatitis ([place]) 06/02/2013  . Recurrent acute pancreatitis 03/28/2013  . Alcohol withdrawal ([place]) 03/28/2013  . Pancreatitis 03/09/2013  . Tobacco abuse 03/09/2013  . 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 Benzodiazepine withdrawal ([place]) 02/09/2013  . Protein-calorie malnutrition, severe ([place]) 02/09/2013  . Hypokalemia 02/27/2011  . Abdominal pain, acute 02/25/2011  . Nausea and vomiting 02/25/2011  . Thrombocytopenia ([place]) 02/25/2011  . HTN (hypertension), malignant 02/25/2011  . Malignant neoplasm of kidney excluding renal pelvis ([place]) 10/18/2008  . Anxiety state 10/18/2008  . GERD 10/18/2008  . PANCREATITIS, CHRONIC- atrophic pancreas 10/18/2008  . DYSPNEA 10/18/2008  . GASTRIC ULCER, HX OF 10/18/2008    Orientation RESPIRATION BLADDER Height & Weight     Self, Time, Place  Normal Continent Weight: 70 lb (31.8 kg) Height:  5\' 4"  (162.6 cm)  BEHAVIORAL SYMPTOMS/MOOD NEUROLOGICAL BOWEL NUTRITION STATUS      Incontinent Diet(regular)  AMBULATORY STATUS COMMUNICATION OF NEEDS Skin   Extensive Assist Verbally Skin abrasions(laceration to L foot, gauze)                       Personal Care Assistance Level of Assistance  Bathing, Feeding, Dressing Bathing Assistance: Maximum assistance Feeding assistance: Independent Dressing Assistance: Maximum assistance     Functional Limitations Info  Sight, Hearing, Speech Sight Info: Adequate Hearing Info: Adequate Speech Info: Adequate    SPECIAL CARE FACTORS FREQUENCY                       Contractures Contractures Info: Not present    Additional Factors Info  Code Status, Allergies Code Status Info: DNR Allergies Info: Aspirin, Chlorpromazine Hcl           Current Medications (12/12/2017):  This is the current hospital active  medication list Current Facility-Administered Medications  Medication Dose Route Frequency Provider Last Rate Last Dose  . acetaminophen (TYLENOL) tablet 650 mg  650 mg Oral Q6H PRN Rise Patience, MD   650 mg at 12/12/17 1059   Or  . acetaminophen (TYLENOL) suppository 650 mg  650 mg Rectal Q6H PRN Rise Patience, MD       . dexamethasone (DECADRON) tablet 8 mg  8 mg Oral Q12H Ladell Pier, MD      . diazepam (VALIUM) tablet 5 mg  5 mg Oral Q6H PRN Charlynne Cousins, MD   5 mg at 12/12/17 0945  . enoxaparin (LOVENOX) injection 30 mg  30 mg Subcutaneous QHS Rise Patience, MD   30 mg at 12/11/17 2120  . famotidine (PEPCID) tablet 20 mg  20 mg Oral Daily Rise Patience, MD   20 mg at 12/12/17 0946  . feeding supplement (ENSURE ENLIVE) (ENSURE ENLIVE) liquid 237 mL  237 mL Oral BID BM Charlynne Cousins, MD   237 mL at 12/11/17 1320  . gabapentin (NEURONTIN) capsule 300 mg  300 mg Oral QID Rise Patience, MD   300 mg at 12/12/17 1330  . guaiFENesin-dextromethorphan (ROBITUSSIN DM) 100-10 MG/5ML syrup 5 mL  5 mL Oral Q4H PRN Charlynne Cousins, MD   5 mL at 12/11/17 2154  . losartan (COZAAR) tablet 50 mg  50 mg Oral Daily Rise Patience, MD   50 mg at 12/12/17 0946  . mirtazapine (REMERON) tablet 45 mg  45 mg Oral QHS Rise Patience, MD   45 mg at 12/11/17 2121  . mometasone-formoterol (DULERA) 200-5 MCG/ACT inhaler 2 puff  2 puff Inhalation BID Rise Patience, MD   2 puff at 12/12/17 0851  . ondansetron (ZOFRAN) tablet 4 mg  4 mg Oral Q6H PRN Rise Patience, MD   4 mg at 12/12/17 0345   Or  . ondansetron (ZOFRAN) injection 4 mg  4 mg Intravenous Q6H PRN Rise Patience, MD   4 mg at 12/12/17 1102  . oxyCODONE (Oxy IR/ROXICODONE) immediate release tablet 10 mg  10 mg Oral QID PRN Rise Patience, MD   10 mg at 12/12/17 1059  . oxyCODONE (OXYCONTIN) 12 hr tablet 20 mg  20 mg Oral Q12H Charlynne Cousins, MD   20 mg at 12/12/17 0945  . polyethylene glycol (MIRALAX / GLYCOLAX) packet 17 g  17 g Oral Daily Charlynne Cousins, MD   17 g at 12/12/17 0946  . umeclidinium bromide (INCRUSE ELLIPTA) 62.5 MCG/INH 1 puff  1 puff Inhalation Daily Rise Patience, MD   1 puff at 12/12/17 3559     Discharge Medications: Please see discharge summary for a list  of discharge medications.  Relevant Imaging Results:  Relevant Lab Results:   Additional Information [id number]. Needs hospice to follow at facility  [facility], LCSW

## 2017-12-12 NOTE — Progress Notes (Signed)
IP PROGRESS NOTE  Subjective:   [name] presented to the emergency room 12/09/2017 with a left foot injury and progressive left-sided weakness.  There is concern for a left foot infection.  She was admitted and placed on antibiotics. She completed SRS treatment to the right frontal metastasis on 12/08/2017. She reports increased left-sided weakness.  Her husband presented to the Cancer center 12/09/2017 and reported he can no longer take care of her in the home.  [name] reports "hurting all over ".  Objective: Vital signs in last 24 hours: Blood pressure (!) 147/77, pulse 89, temperature 98.1 F (36.7 C), temperature source Oral, resp. rate 16, height _0  (1.626 m), weight 70 lb (31.8 kg), SpO2 100 %.  Intake/Output from previous day: 12/08 0701 - 12/09 0700 In: 240 [P.O.:240] Out: -   Physical Exam:  Lungs: Distant breath sounds, no respiratory distress Cardiac: Regular rate and rhythm Abdomen: Nontender, no hepatosplenomegaly Extremities: No leg edema Neurologic: 3/5 strength in the left arm, left hand, left leg, and left foot    Lab Results: Recent Labs    12/10/17 1617 12/12/17 0457  WBC 12.9* 23.7*  HGB 9.8* 10.5*  HCT 31.4* 32.3*  PLT 178 202    BMET Recent Labs    12/09/17 1947 12/10/17 1617  NA 142 137  K 2.8* 4.0  CL 108 110  CO2 24 14*  GLUCOSE 101* 297*  BUN 24* 27*  CREATININE 0.72 0.92  CALCIUM 8.8* 8.1*    Lab Results  Component Value Date   CEA1 7.08 (H) 10/19/2017    Medications: I have reviewed the patient's current medications.  Assessment/Plan: 1. Left lung mass-CT biopsy 10/10/2017 revealed metastatic adenocarcinoma, focal CDX-2 positivity, TTF-1Napsin A,and cytokeratin 20negative. Positive for cytokeratin 7 and PAX 8 (patchy week)  PD-L1 score 0%, MSS, tumor mutation burden 8,BRIP1, noKRAS , BRAF, or NRASmutation  PET scan 10/03/2017-hypermetabolic 3 similar left lower lobe mass, low level metabolic activity  associated with bibasilar nodular and bandlike densities felt to be inflammatory, high activity in the tongue and neck musculature  SBRTto left lung mass beginning 11/01/2017, completed 11/11/2017  Left-sided weakness 11/21/2017, CT/MRI-isolated right frontal metastasis with surrounding edema  SRS to the right frontal metastasis 12/08/2017  2. COPD 3. Pancreatitis 4. Chronic pain syndrome 5. Left renal cell carcinoma treated with RFA in 2009 and cryoablation in 2012 6. Remote history of heavy alcohol use 7. Ongoing tobacco use 8. History of a partial gastrectomy 9. Anemia  [name] is admitted with failure to thrive, a left foot abrasion, and increased left-sided weakness.  She has metastatic adenocarcinoma involving the brain.  A primary tumor site has not been identified, but I suspect she has metastatic non-small cell lung cancer.  This is based on the clinical presentation and smoking history.  The differential diagnosis includes metastatic renal cell carcinoma and metastatic carcinoma from another primary tumor site.  She is not a candidate for systemic therapy secondary to her poor performance status.  I discussed the prognosis with [name].  I recommend nursing facility placement and Hospice care.  I estimate her survival to be measured in weeks to months.  We began a discussion regarding CODE STATUS.  Her husband was not present this morning.  The left-sided weakness has progressed compared to when I saw her during the last hospital admission.  This may be related to progressive tumor and edema in the right brain.  I will increase the Decadron dose.  Recommendations: 1.  Increase  Decadron to 8 mg twice daily 2.  [facility] hospice referral for care in a skilled nursing facility 3.  I will plan to follow her with the [facility] program. 4.  Continue discussions with the patient and her husband regarding CODE STATUS.   LOS: 0 days   Betsy Coder, MD   12/12/2017,  8:20 AM

## 2017-12-13 MED ORDER — DEXAMETHASONE 4 MG PO TABS: 8.0000 mg | ORAL_TABLET | Freq: Two times a day (BID) | ORAL | Status: DC

## 2017-12-13 MED ORDER — DOXYCYCLINE HYCLATE 100 MG PO TABS: 100.0000 mg | ORAL_TABLET | Freq: Two times a day (BID) | ORAL | Status: DC

## 2017-12-13 MED ORDER — FLUCONAZOLE 100 MG PO TABS: 100.0000 mg | ORAL_TABLET | Freq: Every day | ORAL | 0 refills | Status: DC

## 2017-12-13 MED ORDER — HEPARIN SODIUM (PORCINE) 5000 UNIT/ML IJ SOLN: 5000.0000 [IU] | Freq: Three times a day (TID) | INTRAMUSCULAR | Status: DC

## 2017-12-13 MED ORDER — DIAZEPAM 5 MG PO TABS: 5.0000 mg | ORAL_TABLET | Freq: Four times a day (QID) | ORAL | 0 refills | Status: AC | PRN

## 2017-12-13 MED ORDER — OXYCODONE HCL ER 20 MG PO T12A: 20.0000 mg | EXTENDED_RELEASE_TABLET | Freq: Two times a day (BID) | ORAL | 0 refills | Status: AC

## 2017-12-13 MED ORDER — FLUCONAZOLE 100 MG PO TABS
100.0000 mg | ORAL_TABLET | Freq: Every day | ORAL | Status: DC
  Administered 2017-12-13: 100 mg via ORAL
  Filled 2017-12-13: qty 1

## 2017-12-13 MED ORDER — OXYCODONE HCL 10 MG PO TABS: 10.0000 mg | ORAL_TABLET | Freq: Four times a day (QID) | ORAL | 0 refills | Status: AC | PRN

## 2017-12-13 NOTE — Care Management Note (Signed)
Case Management Note  Patient Details  Name: [name] MRN: [id number] Date of Birth: 01-14-1950  Subjective/Objective:                    Action/Plan:   Expected Discharge Date:  12/13/17               Expected Discharge Plan:  Skilled Nursing Facility  In-House Referral:  Clinical Social Work  Discharge planning Services  CM Consult  Post Acute Care Choice:    Choice offered to:  Spouse  DME Arranged:    DME Agency:     HH Arranged:    [place] Agency:     Status of Service:  Completed, signed off  If discussed at [employer], dates discussed:    Additional CommentsPurcell Mouton, RN 12/13/2017, 1:30 PM

## 2017-12-13 NOTE — Progress Notes (Signed)
IP PROGRESS NOTE  Subjective:   Ms. Henthorn complains of pain.   Objective: Vital signs in last 24 hours: Blood pressure (!) 157/95, pulse 78, temperature 98.5 F (36.9 C), temperature source Oral, resp. rate 18, height 5' 4"  (1.626 m), weight 70 lb (31.8 kg), SpO2 97 %.  Intake/Output from previous day: 12/09 0701 - 12/10 0700 In: 480 [P.O.:480] Out: -   Physical Exam: HEENT: Thrush over the tongue and bilateral buccal mucosa  Extremities: No leg edema Neurologic: 4/5 strength in the left arm, left hand, left leg, and left foot    Lab Results: Recent Labs    12/10/17 1617 12/12/17 0457  WBC 12.9* 23.7*  HGB 9.8* 10.5*  HCT 31.4* 32.3*  PLT 178 202    BMET Recent Labs    12/10/17 1617  NA 137  K 4.0  CL 110  CO2 14*  GLUCOSE 297*  BUN 27*  CREATININE 0.92  CALCIUM 8.1*    Lab Results  Component Value Date   CEA1 7.08 (H) 10/19/2017    Medications: I have reviewed the patient's current medications.  Assessment/Plan: 1. Left lung mass-CT biopsy 10/10/2017 revealed metastatic adenocarcinoma, focal CDX-2 positivity, TTF-1Napsin A,and cytokeratin 20negative. Positive for cytokeratin 7 and PAX 8 (patchy week)  PD-L1 score 0%, MSS, tumor mutation burden 8,BRIP1, noKRAS , BRAF, or NRASmutation  PET scan 10/03/2017-hypermetabolic 3 similar left lower lobe mass, low level metabolic activity associated with bibasilar nodular and bandlike densities felt to be inflammatory, high activity in the tongue and neck musculature  SBRTto left lung mass beginning 11/01/2017, completed 11/11/2017  Left-sided weakness 11/21/2017, CT/MRI-isolated right frontal metastasis with surrounding edema  SRS to the right frontal metastasis 12/08/2017  2. COPD 3. Pancreatitis 4. Chronic pain syndrome 5. Left renal cell carcinoma treated with RFA in 2009 and cryoablation in 2012 6. Remote history of heavy alcohol use 7. Ongoing tobacco use 8. History of a partial  gastrectomy 9. Anemia 10. Oral candidiasis  Ms. Prowell has persistent left-sided weakness.  The weakness appears slightly improved today.  We increases the Decadron dose yesterday. She will be discharged to a skilled nursing facility with Hospice care.  I will plan to follow her with the [facility] program.  We will schedule outpatient follow-up at the Cancer center. Recommendations: 1.  Continue Decadron to 8 mg twice daily, plan for outpatient taper 2.  [facility] hospice referral for care in a skilled nursing facility 3.  I will plan to follow her with the [facility] program. 4.  Diflucan for oral candidiasis   LOS: 1 day   Betsy Coder, MD   12/13/2017, 11:19 AM

## 2017-12-13 NOTE — Progress Notes (Signed)
Report called to [place] spoke with Darrold Span.

## 2017-12-13 NOTE — Clinical Social Work Placement (Signed)
Pt admitting to [facility]- report [phone]. Discussed referral to hospice with pt's husband and with facility rep. Husband requested not to refer to hospice yet- wants to have pt admit to facility and have therapy eval. Facility rep confirms that this is appropriate, will have care conference after admission to solidify goals. Will arrange transportation   [place]  NOTE  Date:  12/13/2017  Patient Details  Name: [name] MRN: [id number] Date of Birth: Dec 28, 1950  Clinical Social Work is seeking post-discharge placement for this patient at the [place] level of care (*CSW will initial, date and re-position this form in  chart as items are completed):  Yes   Patient/family provided with [place] Work Department's list of facilities offering this level of care within the geographic area requested by the patient (or if unable, by the patient's family).  Yes   Patient/family informed of their freedom to choose among providers that offer the needed level of care, that participate in Medicare, Medicaid or managed care program needed by the patient, have an available bed and are willing to accept the patient.  Yes   Patient/family informed of 's ownership interest in [facility] and [facility], as well as of the fact that they are under no obligation to receive care at these facilities.  PASRR submitted to EDS on       PASRR number received on       Existing PASRR number confirmed on 12/12/17     FL2 transmitted to all facilities in geographic area requested by pt/family on 12/12/17     FL2 transmitted to all facilities within larger geographic area on       Patient informed that his/her managed care company has contracts with or will negotiate with certain facilities, including the following:        Yes   Patient/family informed of bed offers received.  Patient chooses bed at [facility])     Physician recommends and patient chooses bed at [facility])    Patient to be transferred to [facility]) on 12/13/17.  Patient to be transferred to facility by [facility]     Patient family notified on 12/13/17 of transfer.  Name of family member notified:  husband Legrand Como     PHYSICIAN       Additional Comment:    _______________________________________________ Nila Nephew, LCSW 12/13/2017, 12:02 PM [phone]

## 2017-12-13 NOTE — Discharge Summary (Addendum)
Physician Discharge Summary  [name] STM:[id number] DOB: 01/21/1950 DOA: 12/09/2017  PCP: Azzie Glatter, FNP  Admit date: 12/09/2017 Discharge date: 12/13/2017  Admitted From: home Disposition:  Home  Recommendations for Outpatient Follow-up:  1. She will go to a rehab facility, hospice imperative care to follow-up at facility. 2. We will focus on pain control as she is moving towards comfort care the patient has been on narcotics at home for very long time and has a high tolerance to pain.  Home Health:No Equipment/Devices:none  Discharge Condition:Hospice CODE STATUS:DNR Diet recommendation: Comfort feeds  Brief/Interim Summary: 67 y.o. 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 She had a mild leukocytosis likely due to steroid she was started on IV doxy. Erythema and warmth of the foot improved. She was changed to oral doxy but when she talked to hospice and palliative care antibiotics were DC'd. Patient with palliative care and decided to move towards full comfort. And we will focus on pain control.  Kalemia: Replete orally.  COPD gold 3: Continue inhalers.  Essential hypertension: Antihypertensive medications were DC'd.  Left-sided  hemiplegia with brain mets: Oncology was consulted and they recommended no further chemo or radiation therapy. After this family met with palliative care and wanted for comfort measures. Decadron was discontinued.  Metastatic adenocarcinoma of unknown origin:  Severe protein caloric malnutrition  Discharge Instructions  Discharge Instructions    Diet - low sodium heart healthy   Complete by:  As directed    Increase activity slowly   Complete by:  As directed      Allergies as of 12/13/2017      Reactions   Aspirin Other (See Comments)   Break out in welts   Chlorpromazine Hcl Other (See Comments)   Muscle spasms - "really bad"      Medication List    STOP taking these medications   DULERA 200-5 MCG/ACT Aero Generic drug:  mometasone-formoterol   losartan 50 MG tablet Commonly known as:  COZAAR   megestrol 20 MG tablet Commonly known as:  MEGACE   ondansetron 4 MG tablet Commonly known as:  ZOFRAN   oxyCODONE-acetaminophen 5-325 MG tablet Commonly known as:  PERCOCET/ROXICET     TAKE these medications   dexamethasone 4 MG tablet Commonly known as:  DECADRON Take 2 tablets (8 mg total) by mouth every 12 (twelve) hours. What changed:  how much to take   diazepam 5 MG tablet Commonly known as:  VALIUM Take 1 tablet (5 mg total) by mouth every 6 (six) hours as needed for anxiety.   famotidine 20 MG tablet Commonly known as:  PEPCID Take 1 tablet (20 mg total) by mouth daily. What changed:    when to take this  reasons to take this   feeding  supplement (ENSURE ENLIVE) Liqd Take 237 mLs by mouth 3 (three) times daily between meals.   fluconazole 100 MG tablet Commonly known as:  DIFLUCAN Take 1 tablet (100 mg total) by mouth daily for 10 days. Start taking on:  12/14/2017   gabapentin 300 MG capsule Commonly known as:  NEURONTIN Take 300 mg by mouth 4 (four) times daily.   ipratropium-albuterol 0.5-2.5 (3) MG/3ML Soln Commonly known as:   DUONEB Take 3 mLs by nebulization every 6 (six) hours as needed. Use 3 times daily x 4 days, then every 6 hours as needed. What changed:    reasons to take this  additional instructions   LORazepam 1 MG tablet Commonly known as:  ATIVAN Take 1 tablet (1 mg total) by mouth every 8 (eight) hours as needed for anxiety.   mirtazapine 45 MG tablet Commonly known as:  REMERON Take 45 mg by mouth at bedtime.   Oxycodone HCl 10 MG Tabs Take 1 tablet (10 mg total) by mouth 4 (four) times daily as needed (pain). What changed:  Another medication with the same name was added. Make sure you understand how and when to take each.   oxyCODONE 20 mg 12 hr tablet Commonly known as:  OXYCONTIN Take 1 tablet (20 mg total) by mouth every 12 (twelve) hours. What changed:  You were already taking a medication with the same name, and this prescription was added. Make sure you understand how and when to take each.   polyethylene glycol packet Commonly known as:  MIRALAX / GLYCOLAX Take 17 g by mouth daily as needed for mild constipation.   PROAIR HFA 108 (90 Base) MCG/ACT inhaler Generic drug:  albuterol Inhale 1 puff into the lungs every 4 (four) hours as needed for wheezing or shortness of breath.   tiotropium 18 MCG inhalation capsule Commonly known as:  SPIRIVA PLACE 1 CAPSULE INTO INHALER AND INHALE DAILY What changed:    how much to take  how to take this  when to take this  additional instructions       Allergies  Allergen Reactions  . Aspirin Other (See Comments)    Break out in welts  . Chlorpromazine Hcl Other (See Comments)    Muscle spasms - "really bad"    Consultations:  PMT  ONcology   Procedures/Studies: Ct Abdomen Pelvis Wo Contrast  Result Date: 11/21/2017 CLINICAL DATA:  Abdominal pain.  Patient refused IV contrast. EXAM: CT ABDOMEN AND PELVIS WITHOUT CONTRAST TECHNIQUE: Multidetector CT imaging of the abdomen and pelvis was performed following the  standard protocol without IV contrast. COMPARISON:  08/30/2017, 10/10/2017 FINDINGS: Lower chest: Mild bibasilar scarring. Hepatobiliary: No focal liver abnormality is seen. No gallstones, gallbladder wall thickening. Persistent stable dilatation of the common bile duct unchanged compared with multiple prior exams. Pancreas: Unremarkable. No pancreatic ductal dilatation or surrounding inflammatory changes. Spleen: Normal in size without focal abnormality. Adrenals/Urinary Tract: Adrenal glands are unremarkable. No urolithiasis or obstructive uropathy. Partially calcified 13 mm left lower pole renal mass consistent with posttreatment changes from prior ablation. Bladder is unremarkable. Stomach/Bowel: Stomach is within normal limits. No evidence of bowel wall thickening, distention, or inflammatory changes. Vascular/Lymphatic: Abdominal aortic atherosclerosis. Normal caliber abdominal aorta. No lymphadenopathy. Reproductive: Status post hysterectomy. No adnexal masses. Other: No abdominal wall hernia or abnormality. No abdominopelvic ascites. No pneumatosis, pneumoperitoneum or portal venous gas. Musculoskeletal: Degenerative disc disease with disc height loss at L2-3 and L5-S1. Minimal retrolisthesis of L2 on L3. Broad-based disc bulges at L2-3, L3-4,  L4-5 and L5-S1. Bilateral facet arthropathy at L4-5 and L5-S1. Left foraminal stenosis at L5-S1. IMPRESSION: 1. No acute abdominal or pelvic pathology. 2.  Aortic Atherosclerosis (ICD10-I70.0). 3. Lumbar spine spondylosis. Electronically Signed   By: Kathreen Devoid   On: 11/21/2017 12:42   Dg Chest 2 View  Result Date: 11/21/2017 CLINICAL DATA:  Left-sided weakness and shortness of breath EXAM: CHEST - 2 VIEW COMPARISON:  November 14, 2017 chest radiograph and chest CT October 23, 2017 FINDINGS: Previously noted mass in the medial left lower lobe region is again noted, measuring 3.4 x 2.7 x 3.0 cm. There is no edema or consolidation. The heart size and pulmonary  vascular normal. There is aortic atherosclerosis. No evident bone lesions. IMPRESSION: Persistent mass posteromedial left lower lobe region. No new opacity evident. No edema or consolidation. Stable cardiac silhouette. There is aortic atherosclerosis. No evident adenopathy. Aortic Atherosclerosis (ICD10-I70.0). Electronically Signed   By: Lowella Grip III M.D.   On: 11/21/2017 08:19   Dg Chest 2 View  Result Date: 11/14/2017 CLINICAL DATA:  Shortness of breath. EXAM: CHEST - 2 VIEW COMPARISON:  Radiographs and CT scan of October 23, 2017. FINDINGS: The heart size and mediastinal contours are within normal limits. Hyperinflation of the lungs is noted. No pneumothorax or pleural effusion is noted. Right lung is clear. Stable appearance of rounded mass seen medially in left lower lobe as described on prior radiograph and CT scan. The visualized skeletal structures are unremarkable. IMPRESSION: Stable left lower lobe mass is noted concerning for neoplasm. Hyperinflation of the lungs. No other abnormality seen in the chest. Electronically Signed   By: Marijo Conception, M.D.   On: 11/14/2017 12:52   Ct Head Wo Contrast  Result Date: 11/21/2017 CLINICAL DATA:  67 year old female with LEFT arm and LEFT leg weakness for 4 days. History of lung cancer. EXAM: CT HEAD WITHOUT CONTRAST CT CERVICAL SPINE WITHOUT CONTRAST TECHNIQUE: Multidetector CT imaging of the head and cervical spine was performed following the standard protocol without intravenous contrast. Multiplanar CT image reconstructions of the cervical spine were also generated. COMPARISON:  10/03/2017 PET CT FINDINGS: CT HEAD FINDINGS Brain: A 1 cm x 2.5 cm RIGHT frontoparietal hyperdensity/mass with moderate adjacent vasogenic edema is noted likely representing malignancy/metastasis. There is no evidence of acute infarct, definite hemorrhage, extra-axial collection, midline shift or hydrocephalus. Atrophy and mild chronic small-vessel white matter  ischemic changes are noted. Vascular: Atherosclerotic calcifications noted. Skull: Normal. Negative for fracture or focal lesion. Sinuses/Orbits: No acute finding. Other: None. CT CERVICAL SPINE FINDINGS Alignment: Normal. Skull base and vertebrae: No acute fracture. No definite bone lesion or focal pathologic process. Soft tissues and spinal canal: No prevertebral fluid or swelling. No visible canal hematoma. Disc levels: Moderate multilevel degenerative disc disease/spondylosis again noted. Upper chest: A 6 mm RIGHT apical ground-glass nodule is unchanged. No acute abnormality. Other: None IMPRESSION: 1. 1 x 2.5 cm RIGHT frontoparietal hyperdensity/mass with adjacent vasogenic edema highly suspicious for malignancy/metastasis. No midline shift, hydrocephalus or gross hemorrhage. MRI of the brain with and without contrast is recommended for further evaluation. 2. Atrophy and chronic small-vessel white matter ischemic changes. 3. Moderate multilevel degenerative changes within the cervical spine without acute abnormality. 4. Unchanged 6 mm RIGHT apical ground-glass nodule. Electronically Signed   By: Margarette Canada M.D.   On: 11/21/2017 10:08   Ct Cervical Spine Wo Contrast  Result Date: 11/21/2017 CLINICAL DATA:  67 year old female with LEFT arm and LEFT leg weakness for 4 days. History  of lung cancer. EXAM: CT HEAD WITHOUT CONTRAST CT CERVICAL SPINE WITHOUT CONTRAST TECHNIQUE: Multidetector CT imaging of the head and cervical spine was performed following the standard protocol without intravenous contrast. Multiplanar CT image reconstructions of the cervical spine were also generated. COMPARISON:  10/03/2017 PET CT FINDINGS: CT HEAD FINDINGS Brain: A 1 cm x 2.5 cm RIGHT frontoparietal hyperdensity/mass with moderate adjacent vasogenic edema is noted likely representing malignancy/metastasis. There is no evidence of acute infarct, definite hemorrhage, extra-axial collection, midline shift or hydrocephalus.  Atrophy and mild chronic small-vessel white matter ischemic changes are noted. Vascular: Atherosclerotic calcifications noted. Skull: Normal. Negative for fracture or focal lesion. Sinuses/Orbits: No acute finding. Other: None. CT CERVICAL SPINE FINDINGS Alignment: Normal. Skull base and vertebrae: No acute fracture. No definite bone lesion or focal pathologic process. Soft tissues and spinal canal: No prevertebral fluid or swelling. No visible canal hematoma. Disc levels: Moderate multilevel degenerative disc disease/spondylosis again noted. Upper chest: A 6 mm RIGHT apical ground-glass nodule is unchanged. No acute abnormality. Other: None IMPRESSION: 1. 1 x 2.5 cm RIGHT frontoparietal hyperdensity/mass with adjacent vasogenic edema highly suspicious for malignancy/metastasis. No midline shift, hydrocephalus or gross hemorrhage. MRI of the brain with and without contrast is recommended for further evaluation. 2. Atrophy and chronic small-vessel white matter ischemic changes. 3. Moderate multilevel degenerative changes within the cervical spine without acute abnormality. 4. Unchanged 6 mm RIGHT apical ground-glass nodule. Electronically Signed   By: Margarette Canada M.D.   On: 11/21/2017 10:08   Mr Brain Wo Contrast  Result Date: 11/21/2017 CLINICAL DATA:  Left arm and leg weakness, 4 days duration. Left lower lobe lung mass. History of renal cell cancer. EXAM: MRI HEAD WITHOUT CONTRAST TECHNIQUE: Multiplanar, multiecho pulse sequences of the brain and surrounding structures were obtained without intravenous contrast. COMPARISON:  CT 11/21/2017 FINDINGS: Brain: Study suffers from motion degradation. The patient would not allow contrast administration. There is a 12 mm mass at the right frontoparietal vertex with surrounding vasogenic edema. Given the clinical history, this most likely represents a metastasis. No second lesion is identified, though sensitivity is diminished in the absence of contrast. If the patient  can be convinced, contrast-enhanced imaging would be useful. Elsewhere, there are mild chronic small-vessel changes of the white matter. No hydrocephalus or extra-axial collection. No hemorrhage. Vascular: Major vessels at the base of the brain show flow. Skull and upper cervical spine: Negative Sinuses/Orbits: Clear/normal Other: None IMPRESSION: 12 mm mass at the right frontoparietal vertex with pronounced regional vasogenic edema. This is most likely a metastasis. No second lesion is identified. However, the patient would not allow contrast administration, and tiny other lesions are not excluded at this point. Electronically Signed   By: Nelson Chimes M.D.   On: 11/21/2017 11:43   Mr Jeri Cos CW Contrast  Result Date: 11/22/2017 CLINICAL DATA:  Brain metastasis.  Left lung mass. EXAM: MRI HEAD WITHOUT AND WITH CONTRAST TECHNIQUE: Multiplanar, multiecho pulse sequences of the brain and surrounding structures were obtained without and with intravenous contrast. CONTRAST:  3 mL Gadavist COMPARISON:  Noncontrast brain MRI 11/21/2017 FINDINGS: Brain: A heterogeneously enhancing bilobed mass in the right frontoparietal region near the vertex measures 2.9 x 1.2 cm with unchanged extensive surrounding vasogenic edema. Heterogeneously restricted diffusion within the mass suggests hypercellularity. There is regional sulcal effacement without midline shift. No enhancing lesion is identified elsewhere. There is no evidence of acute infarct, intracranial hemorrhage, or extra-axial fluid collection. Scattered small foci of T2 hyperintensity in the cerebral  white matter bilaterally are nonspecific but compatible with mild chronic small vessel ischemic disease. There is moderate cerebral atrophy. Vascular: Major intracranial vascular flow voids are preserved. Skull and upper cervical spine: No suspicious marrow lesion. Sinuses/Orbits: Left cataract extraction. Paranasal sinuses and mastoid air cells are clear. Other: None.  IMPRESSION: 1. 2.9 cm right frontoparietal mass likely representing a metastasis. 2. No second lesion identified. 3. Mild chronic small vessel ischemic disease and moderate cerebral atrophy. Electronically Signed   By: Logan Bores M.D.   On: 11/22/2017 16:55   Dg Foot Complete Left  Result Date: 12/09/2017 CLINICAL DATA:  Left foot is cold to touch with approximately 2 inch laceration that is 45-week-old. Erythema of the toes. EXAM: LEFT FOOT - COMPLETE 3+ VIEW COMPARISON:  None. FINDINGS: Osteopenic appearance of the left foot without bone destruction or fracture. There is 28 degrees of hallux valgus of the great toe. No radiopaque foreign body. The reported 2 inch laceration of the foot is radiographically occult. Mild degenerative joint space narrowing of the tibiotalar, subtalar and midfoot articulations. Mild osteoarthritic joint space narrowing of the DIP and PIP joints of the second through fifth toes and interphalangeal joint as well as first MTP of the great toe. 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 No acute fracture, radiopaque foreign body nor bone destruction. Electronically Signed   By: Ashley Royalty M.D.   On: 12/09/2017 16:37    Subjective: No complaints feels okay  Discharge Exam: Vitals:   12/13/17 0926 12/13/17 1226  BP:  (!) 151/89  Pulse:  76  Resp:  18  Temp:  98.6 F (37 C)  SpO2: 97% 100%   Vitals:   12/12/17 2123 12/13/17 0431 12/13/17 0926 12/13/17 1226  BP:  (!) 157/95  (!) 151/89  Pulse:  78  76  Resp:  18  18  Temp:  98.5 F (36.9 C)  98.6 F (37 C)  TempSrc:  Oral  Oral  SpO2: 100% 100% 97% 100%  Weight:      Height:        General: Pt is alert, awake, not in acute distress Cardiovascular: RRR, S1/S2 +, no rubs, no gallops Respiratory: CTA bilaterally, no wheezing, no rhonchi Abdominal: Soft, NT, ND, bowel sounds + Extremities: no edema, no cyanosis    The results of significant diagnostics from this hospitalization  (including imaging, microbiology, ancillary and laboratory) are listed below for reference.     Microbiology: No results found for this or any previous visit (from the past 240 hour(s)).   Labs: BNP (last 3 results) No results for input(s): BNP in the last 8760 hours. Basic Metabolic Panel: Recent Labs  Lab 12/09/17 1947 12/10/17 1617  NA 142 137  K 2.8* 4.0  CL 108 110  CO2 24 14*  GLUCOSE 101* 297*  BUN 24* 27*  CREATININE 0.72 0.92  CALCIUM 8.8* 8.1*   Liver Function Tests: Recent Labs  Lab 12/09/17 1947  AST 15  ALT 18  ALKPHOS 59  BILITOT 0.3  PROT 6.0*  ALBUMIN 3.4*   No results for input(s): LIPASE, AMYLASE in the last 168 hours. No results for input(s): AMMONIA in the last 168 hours. CBC: Recent Labs  Lab 12/09/17 1947 12/10/17 1617 12/12/17 0457  WBC 11.6* 12.9* 23.7*  NEUTROABS 9.8*  --   --   HGB 10.7* 9.8* 10.5*  HCT 36.5 31.4* 32.3*  MCV 94.6 88.2 87.1  PLT 192 178 202   Cardiac Enzymes: No results for input(s):  CKTOTAL, CKMB, CKMBINDEX, TROPONINI in the last 168 hours. BNP: Invalid input(s): POCBNP CBG: No results for input(s): GLUCAP in the last 168 hours. D-Dimer No results for input(s): DDIMER in the last 72 hours. Hgb A1c No results for input(s): HGBA1C in the last 72 hours. Lipid Profile No results for input(s): CHOL, HDL, LDLCALC, TRIG, CHOLHDL, LDLDIRECT in the last 72 hours. Thyroid function studies No results for input(s): TSH, T4TOTAL, T3FREE, THYROIDAB in the last 72 hours.  Invalid input(s): FREET3 Anemia work up No results for input(s): VITAMINB12, FOLATE, FERRITIN, TIBC, IRON, RETICCTPCT in the last 72 hours. Urinalysis    Component Value Date/Time   COLORURINE YELLOW 11/21/2017 0745   APPEARANCEUR CLEAR 11/21/2017 0745   LABSPEC 1.014 11/21/2017 0745   PHURINE 6.0 11/21/2017 0745   GLUCOSEU >=500 (A) 11/21/2017 0745   HGBUR MODERATE (A) 11/21/2017 0745   BILIRUBINUR NEGATIVE 11/21/2017 0745   BILIRUBINUR  negative 06/03/2017 1120   KETONESUR NEGATIVE 11/21/2017 0745   PROTEINUR NEGATIVE 11/21/2017 0745   UROBILINOGEN 0.2 06/03/2017 1120   UROBILINOGEN 0.2 03/15/2017 1004   NITRITE NEGATIVE 11/21/2017 0745   LEUKOCYTESUR NEGATIVE 11/21/2017 0745   Sepsis Labs Invalid input(s): PROCALCITONIN,  WBC,  LACTICIDVEN Microbiology No results found for this or any previous visit (from the past 240 hour(s)).   Time coordinating discharge: 40 minutes  SIGNED:   Charlynne Cousins, MD  [facility] 12/13/2017, 12:29 PM Pager   If 7PM-7AM, please contact night-coverage [email or web address] Password [email or web address]

## 2017-12-14 ENCOUNTER — Encounter: Payer: Self-pay | Admitting: *Deleted

## 2017-12-14 NOTE — Progress Notes (Signed)
Confirmed with Hamilton Capri at [facility] that patient is scheduled for appointment with Dr. Benay Spice on 12/12 at 3pm. They will provide transportation.

## 2017-12-15 ENCOUNTER — Telehealth: Payer: Self-pay | Admitting: Oncology

## 2017-12-15 ENCOUNTER — Inpatient Hospital Stay: Payer: Medicare Other | Attending: Oncology | Admitting: Oncology

## 2017-12-15 VITALS — BP 119/95 | HR 93 | Temp 97.5°F | Resp 14 | Ht 64.0 in

## 2017-12-15 DIAGNOSIS — C7931 Secondary malignant neoplasm of brain: Secondary | ICD-10-CM | POA: Diagnosis not present

## 2017-12-15 DIAGNOSIS — D649 Anemia, unspecified: Secondary | ICD-10-CM | POA: Diagnosis not present

## 2017-12-15 DIAGNOSIS — R918 Other nonspecific abnormal finding of lung field: Secondary | ICD-10-CM

## 2017-12-15 DIAGNOSIS — Z903 Acquired absence of stomach [part of]: Secondary | ICD-10-CM | POA: Diagnosis not present

## 2017-12-15 DIAGNOSIS — Z79899 Other long term (current) drug therapy: Secondary | ICD-10-CM | POA: Insufficient documentation

## 2017-12-15 DIAGNOSIS — C7802 Secondary malignant neoplasm of left lung: Secondary | ICD-10-CM | POA: Insufficient documentation

## 2017-12-15 DIAGNOSIS — J449 Chronic obstructive pulmonary disease, unspecified: Secondary | ICD-10-CM | POA: Insufficient documentation

## 2017-12-15 DIAGNOSIS — Z8553 Personal history of malignant neoplasm of renal pelvis: Secondary | ICD-10-CM | POA: Diagnosis not present

## 2017-12-15 NOTE — Progress Notes (Signed)
[name]   Diagnosis: Lung mass, brain metastasis  INTERVAL HISTORY:   [name] was discharged from the hospital to a skilled nursing facility on 12/13/2017.  She was discharged on Decadron and Diflucan.  She is here today with her husband.  They report she plans to enroll in the [facility] hospice program.  The left-sided weakness has worsened.  She is not ambulatory.  She continues to have diffuse pain.  She reports the skilled nursing facility is not giving her the oxycodone on schedule.  Objective:  Vital signs in last 24 hours:  Blood pressure (!) 119/95, pulse 93, temperature (!) 97.5 F (36.4 C), temperature source Oral, resp. rate 14, height _0  (1.626 m), SpO2 100 %.    HEENT: No thrush Resp: Distant breath sounds, no respiratory distress Cardio: Regular rate and rhythm GI: No hepatosplenomegaly, no mass, nontender Vascular: No leg edema Neuro: 3+/5 strength in the left leg and foot, 2-3/5 strength in the left arm and hand, she is weakest in the left arm with minimal movement Skin: 3 cm linear ulceration at the dorsum of the left foot without surrounding erythema or exudate  Portacath/PICC-without erythema  Lab Results:  Lab Results  Component Value Date   WBC 23.7 (H) 12/12/2017   HGB 10.5 (L) 12/12/2017   HCT 32.3 (L) 12/12/2017   MCV 87.1 12/12/2017   PLT 202 12/12/2017   NEUTROABS 9.8 (H) 12/09/2017    CMP  Lab Results  Component Value Date   NA 137 12/10/2017   K 4.0 12/10/2017   CL 110 12/10/2017   CO2 14 (L) 12/10/2017   GLUCOSE 297 (H) 12/10/2017   BUN 27 (H) 12/10/2017   CREATININE 0.92 12/10/2017   CALCIUM 8.1 (L) 12/10/2017   PROT 6.0 (L) 12/09/2017   ALBUMIN 3.4 (L) 12/09/2017   AST 15 12/09/2017   ALT 18 12/09/2017   ALKPHOS 59 12/09/2017   BILITOT 0.3 12/09/2017   GFRNONAA >60 12/10/2017   GFRAA >60 12/10/2017     Medications: I have reviewed the patient's current  medications.   Assessment/Plan: 1. Left lung mass-CT biopsy 10/10/2017 revealed metastatic adenocarcinoma, focal CDX-2 positivity, TTF-1Napsin A,and cytokeratin 20negative. Positive for cytokeratin 7 and PAX 8 (patchy week)  PD-L1 score 0%, MSS, tumor mutation burden 8,BRIP1, noKRAS , BRAF, or NRASmutation  PET scan 10/03/2017-hypermetabolic 3 similar left lower lobe mass, low level metabolic activity associated with bibasilar nodular and bandlike densities felt to be inflammatory, high activity in the tongue and neck musculature  SBRTto left lung mass beginning 11/01/2017, completed 11/11/2017  Left-sided weakness 11/21/2017, CT/MRI-isolated right frontal metastasis with surrounding edema  SRS to the right frontal metastasis 12/08/2017  2. COPD 3. Pancreatitis 4. Chronic pain syndrome 5. Left renal cell carcinoma treated with RFA in 2009 and cryoablation in 2012 6. Remote history of heavy alcohol use 7. Ongoing tobacco use 8. History of a partial gastrectomy 9. Anemia 10. Oral candidiasis-improved with Diflucan    Disposition: [name] has metastatic adenocarcinoma.  The staging evaluation to date has revealed a lung mass and right frontal mass.  The primary tumor site is unclear with the differential diagnosis including non-small cell lung cancer, renal cell carcinoma, a gastrointestinal malignancy, or primary tumors from other unrecognized sites.  I explained the diagnosis and treatment options to [name].  She is not a candidate for surgery or systemic therapy secondary to malnutrition, deconditioning, and severe COPD.  She now resides in a skilled  nursing facility and is enrolling in the [facility] hospice program.  She will continue the current narcotic pain regimen.  She will continue Diflucan for oral candidiasis.  Review of records from the skilled nursing facility reveals no Decadron given since discharge from the hospital.  I discussed the case with Dr.  Lisbeth Renshaw.  She will be placed on Decadron at a dose of 4 mg 3 times per day.  We will asked the skilled nursing facility to check her blood sugar.  She will be scheduled for a follow-up visit on 12/26/2017.  40 minutes were spent with the patient today.  The majority of the time was used for counseling and coordination of care.  Betsy Coder, MD  12/15/2017  3:26 PM

## 2017-12-15 NOTE — Telephone Encounter (Signed)
Apt already scheduled per 12/12 los.

## 2017-12-16 ENCOUNTER — Telehealth: Payer: Self-pay | Admitting: *Deleted

## 2017-12-16 NOTE — Telephone Encounter (Signed)
Notified facility that [place] referral has been placed today at request of patient and husband. Also confirmed she has resumed her dexamethasone.

## 2017-12-19 NOTE — Progress Notes (Signed)
  Radiation Oncology         [phone] ________________________________  Name: [name] MRN: [id number]  Date: 12/08/2017  DOB: Feb 01, 1950  SRS End of Treatment Note  Diagnosis:   67 y.o. 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   There were no signs of acute toxicity after treatment.  Plan: The patient has completed radiation treatment. The patient will return to radiation oncology clinic for routine followup in one month. I advised the patient to call or return sooner if they have any questions or concerns related to their recovery or treatment. ________________________________  Jodelle Gross, MD, PhD  This document serves as a record of services personally performed by Kyung Rudd, MD. It was created on his behalf by Rae Lips, a trained medical scribe. The creation of this record is based on the scribe's personal observations and the provider's statements to them. This document has been checked and approved by the attending provider.

## 2017-12-21 ENCOUNTER — Other Ambulatory Visit: Payer: Self-pay | Admitting: *Deleted

## 2017-12-21 ENCOUNTER — Encounter: Payer: Self-pay | Admitting: *Deleted

## 2017-12-21 NOTE — Progress Notes (Addendum)
[facility] Simulation and Treatment Planning Note   Name:  [name] MRN: [id number]   Date: 12/21/2017  DOB: January 16, 1950  Status:outpatient    DIAGNOSIS:    ICD-10-CM   1. 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  Identity was confirmed.  All relevant records and images related to the planned course of therapy were reviewed.  Then, the patient was positioned in a stable reproducible clinical set-up for radiation therapy. Abdominal compression was applied by me.  4D CT images were obtained and reproducible breathing pattern was confirmed. Free breathing CT images were obtained.  Skin markings were placed.  The CT images were loaded into the planning software where the target and avoidance structures were contoured.  The radiation prescription was entered and confirmed.    TREATMENT PLANNING NOTE:  Treatment planning then occurred. I have requested : IMRT planning.This treatment technique is medically necessary due to the high-dose of radiation delivered to the target region which is in close proximity to adjacent critical normal structures.  3 dimensional simulation is performed and dose volume histogram of the gross tumor volume, planning tumor volume and criticial normal structures including the spinal cord and lungs were analyzed and requested.  Special treatment procedure was performed due to high dose per fraction.  The patient will be monitored for increased risk of toxicity.  Daily imaging using cone beam CT will be used for target localization.  I anticipate that the patient will receive 60 Gy in 5 fractions to target volume. Further  adjustments will be made based on the planning process is necessary.  ------------------------------------------------  Jodelle Gross, MD, PhD

## 2017-12-21 NOTE — Progress Notes (Signed)
Medication reconciliation from visit on 12/15/17-

## 2017-12-21 NOTE — Progress Notes (Signed)
  Radiation Oncology         [phone] ________________________________  Name: ROSELIE CIRIGLIANO MRN: [id number]  Date: 12/08/2017  DOB: 05-31-1950   SPECIAL TREATMENT PROCEDURE   3D TREATMENT PLANNING AND DOSIMETRY: The patient's radiation plan was reviewed and approved by Dr. Zada Finders from neurosurgery and radiation oncology prior to treatment. It showed 3-dimensional radiation distributions overlaid onto the planning CT/MRI image set. The [facility] for the target structures as well as the organs at risk were reviewed. The documentation of the 3D plan and dosimetry are filed in the radiation oncology EMR.   NARRATIVE: The patient was brought to the TrueBeam stereotactic radiation treatment machine and placed supine on the CT couch. The head frame was applied, and the patient was set up for stereotactic radiosurgery. Neurosurgery was present for the set-up and delivery   SIMULATION VERIFICATION: In the couch zero-angle position, the patient underwent Exactrac imaging using the Brainlab system with orthogonal KV images. These were carefully aligned and repeated to confirm treatment position for each of the isocenters. The Exactrac snap film verification was repeated at each couch angle.   SPECIAL TREATMENT PROCEDURE: The patient received stereotactic radiosurgery to the following target:  PTV1 target was treated using 4 Arcs to a prescription dose of 18 Gy. ExacTrac Snap verification was performed for each couch angle.   STEREOTACTIC TREATMENT MANAGEMENT: Following delivery, the patient was transported to nursing in stable condition and monitored for possible acute effects. Vital signs were recorded . The patient tolerated treatment without significant acute effects, and was discharged to home in stable condition.  PLAN: Follow-up in one month.   ------------------------------------------------  Jodelle Gross, MD, PhD

## 2017-12-21 NOTE — Progress Notes (Signed)
  Radiation Oncology         [phone] ________________________________  Name: [name] MRN: [id number]  Date: 12/05/2017  DOB: May 29, 1950  DIAGNOSIS:     ICD-10-CM   1. 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  Identity was confirmed.  All relevant records and images related to the planned course of therapy were reviewed.  The patient freely provided informed written consent to proceed with treatment after reviewing the details related to the planned course of therapy. The consent form was witnessed and verified by the simulation staff. Intravenous access was established for contrast administration. Then, the patient was set-up in a stable reproducible supine position for radiation therapy.  A relocatable thermoplastic stereotactic head frame was fabricated for precise immobilization.  CT images were obtained.  Surface markings were placed.  The CT images were loaded into the planning software and fused with the patient's targeting MRI scan.  Then the target and avoidance structures were contoured.  Treatment planning then occurred.  The radiation prescription was entered and confirmed.  I have requested 3D planning  I have requested a DVH of the following structures: Brain stem, brain, left eye, right eye, lenses, optic chiasm, target volumes, uninvolved brain, and normal tissue.    SPECIAL TREATMENT PROCEDURE:  The planned course of therapy using radiation constitutes a special treatment procedure. Special care is required in the management of this patient for the following reasons. This treatment constitutes a Special Treatment Procedure for the following reason: High dose per fraction requiring special monitoring for increased toxicities of treatment including daily imaging.  The special nature of the planned course of radiotherapy will require increased physician  supervision and oversight to ensure patient's safety with optimal treatment outcomes.  PLAN:  The patient will receive 18 Gy in 1 fraction.   ------------------------------------------------  Jodelle Gross, MD, PhD

## 2017-12-26 ENCOUNTER — Inpatient Hospital Stay (HOSPITAL_BASED_OUTPATIENT_CLINIC_OR_DEPARTMENT_OTHER): Payer: Medicare Other | Admitting: Oncology

## 2017-12-26 ENCOUNTER — Telehealth: Payer: Self-pay | Admitting: Oncology

## 2017-12-26 VITALS — BP 136/87 | HR 100 | Temp 98.4°F | Resp 17 | Ht 64.0 in

## 2017-12-26 DIAGNOSIS — Z903 Acquired absence of stomach [part of]: Secondary | ICD-10-CM

## 2017-12-26 DIAGNOSIS — D649 Anemia, unspecified: Secondary | ICD-10-CM | POA: Diagnosis not present

## 2017-12-26 DIAGNOSIS — Z8553 Personal history of malignant neoplasm of renal pelvis: Secondary | ICD-10-CM

## 2017-12-26 DIAGNOSIS — C7931 Secondary malignant neoplasm of brain: Secondary | ICD-10-CM | POA: Diagnosis not present

## 2017-12-26 DIAGNOSIS — C7802 Secondary malignant neoplasm of left lung: Secondary | ICD-10-CM | POA: Diagnosis not present

## 2017-12-26 DIAGNOSIS — Z79899 Other long term (current) drug therapy: Secondary | ICD-10-CM

## 2017-12-26 DIAGNOSIS — C642 Malignant neoplasm of left kidney, except renal pelvis: Secondary | ICD-10-CM

## 2017-12-26 DIAGNOSIS — R918 Other nonspecific abnormal finding of lung field: Secondary | ICD-10-CM

## 2017-12-26 DIAGNOSIS — J449 Chronic obstructive pulmonary disease, unspecified: Secondary | ICD-10-CM

## 2017-12-26 NOTE — Progress Notes (Signed)
  [facility] OFFICE PROGRESS NOTE   Diagnosis: Metastatic adenocarcinoma  INTERVAL HISTORY:   Ms. Joaquin returns for a scheduled visit.  She is here with her husband.  He reports her clinical status has improved over the past few weeks.  She is participating in physical therapy.  Good appetite.  She complains of nausea, not relieved with Zofran.  No emesis.  Her urine and bowels are functioning.  She has noted partial improvement in left-sided weakness. She has congestion and wheezing.  Ms. Doleman reports that she requires breakthrough pain medication 4-5 times per day.  She would like to receive the oxycodone on a schedule.  She and her husband reports she is having difficulty obtaining the medication when needed. Objective:  Vital signs in last 24 hours:  Blood pressure 136/87, pulse 100, temperature 98.4 F (36.9 C), temperature source Oral, resp. rate 17, height '5\' 4"'$  (1.626 m), SpO2 95 %.    HEENT: No thrush Resp: Distant breath sounds, bilateral expiratory wheeze, no respiratory distress Cardio: Regular rate and rhythm GI: Nontender, no hepatomegaly Vascular: No leg edema Neuro: Alert, follows commands, 3+/5 strength throughout the left arm, hand, leg, and foot-left-sided strength appears partially improved compared to when I saw her 12/15/2017 Skin: skin tear with Steri-Strips in place at the dorsum of the left hand, bandage in place over the left foot   Medications: I have reviewed the patient's current medications.   Assessment/Plan:  1. Left lung mass-CT biopsy 10/10/2017 revealed metastatic adenocarcinoma, focal CDX-2 positivity, TTF-1Napsin A,and cytokeratin 20negative. Positive for cytokeratin 7 and PAX 8 (patchy week)  PD-L1 score 0%, MSS, tumor mutation burden 8,BRIP1, noKRAS , BRAF, or NRASmutation  PET scan 10/03/2017-hypermetabolic 3 similar left lower lobe mass, low level metabolic activity associated with bibasilar nodular and bandlike  densities felt to be inflammatory, high activity in the tongue and neck musculature  SBRTto left lung mass beginning 11/01/2017, completed 11/11/2017  Left-sided weakness 11/21/2017, CT/MRI-isolated right frontal metastasis with surrounding edema  SRS to the right frontal metastasis 12/08/2017  2. COPD 3. Pancreatitis 4. Chronic pain syndrome 5. Left renal cell carcinoma treated with RFA in 2009 and cryoablation in 2012 6. Remote history of heavy alcohol use 7. Ongoing tobacco use 8. History of a partial gastrectomy 9. Anemia 10. Oral candidiasis-improved with Diflucan    Disposition: Her overall performance status has improved.  This is likely secondary to resuming Decadron.  We tapered the Decadron to 4 mg twice daily.  She will continue physical therapy.  The left-sided strength is partially improved compared to when I saw her on 12/15/2017.  She will try Phenergan for nausea.  We will asked the nursing facility providers to evaluate her for a COPD flare versus pneumonia.  I wrote an order to give oxycodone every 6 hours while awake and to hold for sedation.  Ms. Sou will return for an office visit in 3 weeks.  She is scheduled to see radiation oncology in the interim.  25 minutes were spent with the patient today.  The majority of the time was used for counseling and coordination of care.  Betsy Coder, MD  12/26/2017  2:21 PM

## 2017-12-26 NOTE — Telephone Encounter (Signed)
Scheduled appt per 12/23 los - gave patient AVS and calender per los.

## 2018-01-10 ENCOUNTER — Other Ambulatory Visit: Payer: Self-pay

## 2018-01-10 ENCOUNTER — Ambulatory Visit
Admission: RE | Admit: 2018-01-10 | Discharge: 2018-01-10 | Disposition: A | Discharge status: Home / Self Care | Payer: Medicare Other | Source: Ambulatory Visit | Attending: Radiation Oncology | Admitting: Radiation Oncology

## 2018-01-10 ENCOUNTER — Encounter: Payer: Self-pay | Admitting: Radiation Oncology

## 2018-01-10 VITALS — BP 135/91 | HR 94 | Temp 98.0°F | Resp 24 | Wt 75.6 lb

## 2018-01-10 DIAGNOSIS — Z79899 Other long term (current) drug therapy: Secondary | ICD-10-CM | POA: Diagnosis not present

## 2018-01-10 DIAGNOSIS — C801 Malignant (primary) neoplasm, unspecified: Secondary | ICD-10-CM

## 2018-01-10 DIAGNOSIS — C7931 Secondary malignant neoplasm of brain: Secondary | ICD-10-CM | POA: Insufficient documentation

## 2018-01-10 DIAGNOSIS — E46 Unspecified protein-calorie malnutrition: Secondary | ICD-10-CM | POA: Diagnosis not present

## 2018-01-10 DIAGNOSIS — R5381 Other malaise: Secondary | ICD-10-CM | POA: Diagnosis not present

## 2018-01-10 DIAGNOSIS — C649 Malignant neoplasm of unspecified kidney, except renal pelvis: Secondary | ICD-10-CM

## 2018-01-11 ENCOUNTER — Ambulatory Visit: Payer: Self-pay | Admitting: Radiation Oncology

## 2018-01-11 NOTE — Progress Notes (Addendum)
Radiation Oncology         [phone] ________________________________  Name: [name] MRN: [id number]  Date of Service: 01/10/2018 DOB: Apr 06, 1950  Post Treatment Note  CC: Azzie Glatter, FNP  Maryanna Shape, NP  Diagnosis:   Metastatic Adenocarcinoma of unknown primary  Interval Since Last Radiation:  5 weeks   12/08/2017 SRS Treatment: Brain PTV1: Right Parietal 40mm // 18 Gy in 1 fraction,  11/01/2017-11/11/2017 SBRT Treatment: The tumor in the LLL was treated with a course of stereotactic body radiation treatment. The patient received 60 Gy in 5 fractions at 12 Gy per fraction.  Narrative:  The patient returns today for routine follow-up.  [name]' case is complicated. She has an adenocarcinoma diagnosed on biopsy of her LLL and there was suspicion by pathology of this being a colonic primary. She has had multiple issues of compliance with providers and suspicious drug seeking behavior which have led to her being fired by several practices. She went on to receive SBRT to the LLL lesion as this was the only site of disease during her initial work up. She was counseled on the need for colonoscopy as well. She did meet with Dr. Paulita Fujita and given her performance status, she was not felt to be a candidate to undergo modified anesthesia and the risk was felt to be higher than the benefit. She also presented with neurologic symptoms and was hospitalized in November and was found to have a solitary brain metastasis. She underwent SRS for this, and comes today for her one month follow up. Of note she did have symptoms about 3-4 weeks ago of left upper and lower extremity weakness. She was started on oral steroids with dexamethasone 4 mg TID. She has been tapered to 4 mg BID and this showed significant improvement. She has been residing in a skilled facility for rehabilitation. She has had several episodes of thrush treated with nystatin suspension and fluconazole. Overall she has  felt stronger and is working well with the staff for PT. She denies headaches, visual changes, nausea, or vomiting. No other complaints are noted.                          ALLERGIES:  is allergic to aspirin and chlorpromazine hcl.  Meds: Current Outpatient Medications  Medication Sig Dispense Refill  . dexamethasone (DECADRON) 4 MG tablet Take 4 mg by mouth 3 (three) times daily.    . diazepam (VALIUM) 5 MG tablet Take 1 tablet (5 mg total) by mouth every 6 (six) hours as needed for anxiety. 5 tablet 0  . famotidine (PEPCID) 20 MG tablet Take 1 tablet (20 mg total) by mouth daily. (Patient taking differently: Take 20 mg by mouth at bedtime. ) 30 tablet 0  . feeding supplement, ENSURE ENLIVE, (ENSURE ENLIVE) LIQD Take 237 mLs by mouth 3 (three) times daily between meals. 237 mL 0  . fluconazole (DIFLUCAN) 50 MG tablet Take 50 mg by mouth daily.    Marland Kitchen gabapentin (NEURONTIN) 300 MG capsule Take 300 mg by mouth 4 (four) times daily.   1  . ipratropium-albuterol (DUONEB) 0.5-2.5 (3) MG/3ML SOLN Take 3 mLs by nebulization every 6 (six) hours as needed. Use 3 times daily x 4 days, then every 6 hours as needed. (Patient taking differently: Take 3 mLs by nebulization 3 (three) times daily as needed (sob and wheezing). ) 360 mL 0  . LORazepam (ATIVAN) 1 MG tablet Take 1 tablet (  1 mg total) by mouth every 8 (eight) hours as needed for anxiety. 15 tablet 0  . mirtazapine (REMERON) 45 MG tablet Take 45 mg by mouth at bedtime.   1  . ondansetron (ZOFRAN) 4 MG tablet Take 4 mg by mouth every 6 (six) hours as needed.    Marland Kitchen oxyCODONE (OXYCONTIN) 20 mg 12 hr tablet Take 1 tablet (20 mg total) by mouth every 12 (twelve) hours. 14 tablet 0  . Oxycodone HCl 10 MG TABS Take 1 tablet (10 mg total) by mouth 4 (four) times daily as needed (pain). 10 tablet 0  . Polyethylene Glycol 3350 (MIRALAX PO) Take 17 g by mouth daily.    Marland Kitchen PROAIR HFA 108 (90 Base) MCG/ACT inhaler Inhale 1 puff into the lungs every 4 (four) hours as  needed for wheezing or shortness of breath. 1 Inhaler 5  . promethazine (PHENERGAN) 12.5 MG tablet Take 12.5 mg by mouth every 8 (eight) hours as needed for nausea or vomiting.    . tiotropium (SPIRIVA HANDIHALER) 18 MCG inhalation capsule PLACE 1 CAPSULE INTO INHALER AND INHALE DAILY (Patient taking differently: Place 18 mcg into inhaler and inhale daily. ) 30 capsule 0   No current facility-administered medications for this encounter.     Physical Findings:  weight is 75 lb 9.6 oz (34.3 kg). Her oral temperature is 98 F (36.7 C). Her blood pressure is 135/91 (abnormal) and her pulse is 94. Her respiration is 24 (abnormal) and oxygen saturation is 98%.  Pain Assessment Pain Score: 8  Pain Frequency: Constant/10 In general this is a thin, cachectic appearing caucasian female in no acute distress. She's alert and oriented x4 and appropriate throughout the examination. Cardiopulmonary assessment is negative for acute distress and she exhibits normal effort. She does have moon facies consistent with her ongoing steroid use, and oral thrush in the posterior pharynx. She is otherwise neurologically intact. She has intact sensory perception to light touch of bilateral upper and lower extremities, and 5/5 strength of bilateral upper and lower extremities.   Lab Findings: Lab Results  Component Value Date   WBC 23.7 (H) 12/12/2017   HGB 10.5 (L) 12/12/2017   HCT 32.3 (L) 12/12/2017   MCV 87.1 12/12/2017   PLT 202 12/12/2017     Radiographic Findings: No results found.  Impression/Plan: 1. Metastatic Adenocarcinoma of unknown primary. The patient is doing quite well despite her situation. She continues under the care of Dr. Benay Spice and will be seen next week for follow up. She will also be due for repeat imaging of the brain in about 2 months.  2. Protein calorie malnutrition and deconditioning. The patient will continue with the PT and nutrition she's receiving at her rehab  facility. 3. Stigmata of chronic steroid use. I encouraged her to taper her steroids to 4 mg daily (2 mg in am, 2 mg in pm) for 1 week, then 2 mg daily x 1 week, then 2 mg QOD x 1 week then stop. I also wrote orders for the facility in addition to her taper for Fluconazole 150 mg x1. She was encouraged to call if her symptoms during taper of weakness returned.     Carola Rhine, PAC

## 2018-01-12 ENCOUNTER — Other Ambulatory Visit: Payer: Self-pay | Admitting: Radiation Therapy

## 2018-01-16 ENCOUNTER — Inpatient Hospital Stay: Payer: Medicare Other | Attending: Oncology | Admitting: Nurse Practitioner

## 2018-01-16 ENCOUNTER — Telehealth: Payer: Self-pay | Admitting: Oncology

## 2018-01-16 VITALS — BP 124/98 | HR 89 | Temp 98.6°F | Resp 18 | Ht 64.0 in | Wt 76.7 lb

## 2018-01-16 DIAGNOSIS — D649 Anemia, unspecified: Secondary | ICD-10-CM | POA: Insufficient documentation

## 2018-01-16 DIAGNOSIS — R918 Other nonspecific abnormal finding of lung field: Secondary | ICD-10-CM

## 2018-01-16 DIAGNOSIS — C7802 Secondary malignant neoplasm of left lung: Secondary | ICD-10-CM | POA: Diagnosis present

## 2018-01-16 DIAGNOSIS — C7931 Secondary malignant neoplasm of brain: Secondary | ICD-10-CM | POA: Diagnosis not present

## 2018-01-16 DIAGNOSIS — Z903 Acquired absence of stomach [part of]: Secondary | ICD-10-CM

## 2018-01-16 DIAGNOSIS — J449 Chronic obstructive pulmonary disease, unspecified: Secondary | ICD-10-CM | POA: Insufficient documentation

## 2018-01-16 DIAGNOSIS — F17210 Nicotine dependence, cigarettes, uncomplicated: Secondary | ICD-10-CM | POA: Diagnosis not present

## 2018-01-16 DIAGNOSIS — Z9221 Personal history of antineoplastic chemotherapy: Secondary | ICD-10-CM | POA: Diagnosis not present

## 2018-01-16 DIAGNOSIS — C642 Malignant neoplasm of left kidney, except renal pelvis: Secondary | ICD-10-CM | POA: Diagnosis not present

## 2018-01-16 DIAGNOSIS — C799 Secondary malignant neoplasm of unspecified site: Secondary | ICD-10-CM

## 2018-01-16 NOTE — Telephone Encounter (Signed)
Gave patient spouse avs report and appointments February. Message to GBS/LT/Lab supervisor re spouse's concern about lab draw 2/3. Central radiology will call re scan.

## 2018-01-16 NOTE — Progress Notes (Addendum)
  [place] OFFICE PROGRESS NOTE   Diagnosis: Metastatic adenocarcinoma  INTERVAL HISTORY:   Ms. Niebla returns as scheduled.  He is accompanied by her husband.  He reports she is doing much better.  This includes with appetite, mobility and left side strength.  He took her home for 2 days over the past weekend and overall she did well.  He reports her pain is well controlled.  She continues to have a left foot wound.  No fever.  Objective:  Vital signs in last 24 hours:  Blood pressure (!) 124/98, pulse 89, temperature 98.6 F (37 C), temperature source Oral, resp. rate 18, height _0  (1.626 m), weight 76 lb 11.2 oz (34.8 kg), SpO2 93 %.    HEENT: No thrush or ulcers. Resp: Distant breath sounds.  No respiratory distress. Cardio: Regular rate and rhythm. GI: Abdomen soft and nontender.  No hepatomegaly. Vascular: No leg edema. Neuro: Alert.  Follows commands.  Improved strength on the left side. Skin: Dorsal aspect of the left foot with a draining wound.  No surrounding erythema.   Lab Results:  Lab Results  Component Value Date   WBC 23.7 (H) 12/12/2017   HGB 10.5 (L) 12/12/2017   HCT 32.3 (L) 12/12/2017   MCV 87.1 12/12/2017   PLT 202 12/12/2017   NEUTROABS 9.8 (H) 12/09/2017    Imaging:  No results found.  Medications: I have reviewed the patient's current medications.  Assessment/Plan: 1. Left lung mass-CT biopsy 10/10/2017 revealed metastatic adenocarcinoma, focal CDX-2 positivity, TTF-1Napsin A,and cytokeratin 20negative. Positive for cytokeratin 7 and PAX 8 (patchy week)  PD-L1 score 0%, MSS, tumor mutation burden 8,BRIP1, noKRAS , BRAF, or NRASmutation  PET scan 10/03/2017-hypermetabolic 3 similar left lower lobe mass, low level metabolic activity associated with bibasilar nodular and bandlike densities felt to be inflammatory, high activity in the tongue and neck musculature  SBRTto left lung mass beginning 11/01/2017, completed  11/11/2017  Left-sided weakness 11/21/2017, CT/MRI-isolated right frontal metastasis with surrounding edema  SRS to the right frontal metastasis 12/08/2017  2. COPD 3. Pancreatitis 4. Chronic pain syndrome 5. Left renal cell carcinoma treated with RFA in 2009 and cryoablation in 2012 6. Remote history of heavy alcohol use 7. Ongoing tobacco use 8. History of a partial gastrectomy 9. Anemia 10. Oral candidiasis-improved with Diflucan  Disposition: Ms. Bischoff appears improved.  Her husband notes significant improvement in her overall condition over the past few weeks.  She continues therapy at the nursing facility.  We are referring her for restaging CTs in the next several weeks.  She has a nonhealing wound on the left foot.  We will recommend a referral to the wound clinic.  She will return for a follow-up visit here in 4 weeks.  She will contact the office in the interim with any problems.  Patient seen with Dr. Benay Spice.    Ned Card ANP/GNP-BC   01/16/2018  2:57 PM This was a shared visit with Ned Card.  Ms. Benthall is interviewed and examined.  Her performance status has improved.  She continues physical therapy.  The left-sided weakness is partially improved.  We recommend a referral to the wound clinic for evaluation of the open wound at the dorsum of the left foot. She will undergo restaging CT scans prior to an office visit next month.  She continues a Decadron taper as directed by radiation oncology.  Julieanne Manson, MD

## 2018-01-17 MED ORDER — DEXAMETHASONE 1 MG PO TABS: 2.0000 mg | ORAL_TABLET | Freq: Every day | ORAL | 0 refills | Status: DC

## 2018-01-17 NOTE — Addendum Note (Signed)
Addended by: Sharlynn Oliphant A on: 01/17/2018 08:50 AM   Modules accepted: Orders

## 2018-01-31 ENCOUNTER — Other Ambulatory Visit: Payer: Self-pay | Admitting: Radiation Therapy

## 2018-01-31 DIAGNOSIS — C7931 Secondary malignant neoplasm of brain: Secondary | ICD-10-CM

## 2018-01-31 DIAGNOSIS — C7949 Secondary malignant neoplasm of other parts of nervous system: Principal | ICD-10-CM

## 2018-02-01 ENCOUNTER — Other Ambulatory Visit: Payer: Self-pay | Admitting: Nurse Practitioner

## 2018-02-01 DIAGNOSIS — C799 Secondary malignant neoplasm of unspecified site: Secondary | ICD-10-CM

## 2018-02-06 ENCOUNTER — Other Ambulatory Visit: Payer: Self-pay | Admitting: *Deleted

## 2018-02-06 ENCOUNTER — Ambulatory Visit (HOSPITAL_COMMUNITY)
Admission: RE | Admit: 2018-02-06 | Discharge: 2018-02-06 | Disposition: A | Discharge status: Home / Self Care | Payer: Medicare Other | Source: Ambulatory Visit | Attending: Nurse Practitioner | Admitting: Nurse Practitioner

## 2018-02-06 ENCOUNTER — Other Ambulatory Visit: Payer: Self-pay | Admitting: Nurse Practitioner

## 2018-02-06 ENCOUNTER — Ambulatory Visit (HOSPITAL_COMMUNITY): Payer: Self-pay

## 2018-02-06 ENCOUNTER — Other Ambulatory Visit: Payer: Self-pay

## 2018-02-06 ENCOUNTER — Encounter (HOSPITAL_COMMUNITY): Payer: Self-pay | Admitting: Interventional Radiology

## 2018-02-06 ENCOUNTER — Encounter: Payer: Self-pay | Admitting: Radiation Therapy

## 2018-02-06 DIAGNOSIS — C799 Secondary malignant neoplasm of unspecified site: Secondary | ICD-10-CM | POA: Insufficient documentation

## 2018-02-06 DIAGNOSIS — I700 Atherosclerosis of aorta: Secondary | ICD-10-CM | POA: Diagnosis not present

## 2018-02-06 DIAGNOSIS — J439 Emphysema, unspecified: Secondary | ICD-10-CM | POA: Diagnosis not present

## 2018-02-06 DIAGNOSIS — R918 Other nonspecific abnormal finding of lung field: Secondary | ICD-10-CM | POA: Insufficient documentation

## 2018-02-06 DIAGNOSIS — I2510 Atherosclerotic heart disease of native coronary artery without angina pectoris: Secondary | ICD-10-CM | POA: Insufficient documentation

## 2018-02-06 DIAGNOSIS — K838 Other specified diseases of biliary tract: Secondary | ICD-10-CM | POA: Insufficient documentation

## 2018-02-06 DIAGNOSIS — M4854XA Collapsed vertebra, not elsewhere classified, thoracic region, initial encounter for fracture: Secondary | ICD-10-CM | POA: Insufficient documentation

## 2018-02-06 HISTORY — PX: IR VENIPUNCTURE 3YRS/OLDER BY MD: IMG2287

## 2018-02-06 HISTORY — PX: IR US GUIDE VASC ACCESS LEFT: IMG2389

## 2018-02-06 LAB — CBC WITH DIFFERENTIAL (CANCER CENTER ONLY)
Abs Immature Granulocytes: 0.23 10*3/uL — ABNORMAL HIGH (ref 0.00–0.07)
Basophils Absolute: 0 10*3/uL (ref 0.0–0.1)
Basophils Relative: 0 %
Eosinophils Absolute: 0 10*3/uL (ref 0.0–0.5)
Eosinophils Relative: 0 %
HCT: 30.9 % — ABNORMAL LOW (ref 36.0–46.0)
HEMOGLOBIN: 9.6 g/dL — AB (ref 12.0–15.0)
Immature Granulocytes: 2 %
LYMPHS ABS: 0.7 10*3/uL (ref 0.7–4.0)
Lymphocytes Relative: 5 %
MCH: 27 pg (ref 26.0–34.0)
MCHC: 31.1 g/dL (ref 30.0–36.0)
MCV: 87 fL (ref 80.0–100.0)
Monocytes Absolute: 1.2 10*3/uL — ABNORMAL HIGH (ref 0.1–1.0)
Monocytes Relative: 8 %
Neutro Abs: 13.1 10*3/uL — ABNORMAL HIGH (ref 1.7–7.7)
Neutrophils Relative %: 85 %
Platelet Count: 276 10*3/uL (ref 150–400)
RBC: 3.55 MIL/uL — ABNORMAL LOW (ref 3.87–5.11)
RDW: 17.8 % — ABNORMAL HIGH (ref 11.5–15.5)
WBC Count: 15.3 10*3/uL — ABNORMAL HIGH (ref 4.0–10.5)
nRBC: 0 % (ref 0.0–0.2)

## 2018-02-06 LAB — CMP (CANCER CENTER ONLY)
ALT: 22 U/L (ref 0–44)
AST: 10 U/L — AB (ref 15–41)
Albumin: 2.6 g/dL — ABNORMAL LOW (ref 3.5–5.0)
Alkaline Phosphatase: 101 U/L (ref 38–126)
Anion gap: 10 (ref 5–15)
BUN: 14 mg/dL (ref 8–23)
CHLORIDE: 98 mmol/L (ref 98–111)
CO2: 27 mmol/L (ref 22–32)
Calcium: 9.7 mg/dL (ref 8.9–10.3)
Creatinine: 0.88 mg/dL (ref 0.44–1.00)
GFR, Est AFR Am: >60 mL/min (ref >60)
GFR, Estimated: >60 mL/min (ref >60)
Glucose, Bld: 81 mg/dL (ref 70–99)
Potassium: 3.7 mmol/L (ref 3.5–5.1)
Sodium: 135 mmol/L (ref 135–145)
Total Bilirubin: 0.4 mg/dL (ref 0.3–1.2)
Total Protein: 7.2 g/dL (ref 6.5–8.1)

## 2018-02-06 MED ORDER — SODIUM CHLORIDE (PF) 0.9 % IJ SOLN
INTRAMUSCULAR | Status: AC
  Filled 2018-02-06: qty 50

## 2018-02-06 MED ORDER — LIDOCAINE HCL 1 % IJ SOLN
INTRAMUSCULAR | Status: AC
  Filled 2018-02-06: qty 20

## 2018-02-06 MED ORDER — LIDOCAINE HCL 1 % IJ SOLN
INTRAMUSCULAR | Status: AC | PRN
  Administered 2018-02-06: 3 mL

## 2018-02-06 MED ORDER — IOHEXOL 300 MG/ML  SOLN
100.0000 mL | Freq: Once | INTRAMUSCULAR | Status: AC | PRN
  Administered 2018-02-06: 80 mL via INTRAVENOUS

## 2018-02-06 NOTE — Progress Notes (Addendum)
Mr. Jorgenson dropped by today to share a concern he is having about his wife.   The Lt sided weakness has returned. She is no longer able to walk and has no strength in her Lt side. She leans her head to the Rt and is unable to hold herself up very well. He also mentioned that her left hand is very swollen.   She has tapered off of steroids, but he did not notice a correlation of increasing symptoms with decreasing steroids. He said that the issue was very sudden, "she was doing good one day and then overnight she changed. "  He is concerned that there may be something new in her brain or the swelling has recurred.   I sent a message to Dr. Lisbeth Renshaw, Shona Simpson PA-C and Blenda Nicely RN, making them aware of this.   I told Mr. Roscoe that we will call him back with their recommendation which may include moving her March MRI up sooner and /or restarting steroids.  He is happy with this plan and looks forward to our call.   His new phone # is [phone]  Mont Dutton R.T.(R)(T) Special Procedures Navigator

## 2018-02-07 ENCOUNTER — Other Ambulatory Visit: Payer: Self-pay | Admitting: Radiation Oncology

## 2018-02-07 ENCOUNTER — Telehealth: Payer: Self-pay | Admitting: *Deleted

## 2018-02-07 MED ORDER — DEXAMETHASONE 4 MG PO TABS: 4.0000 mg | ORAL_TABLET | Freq: Two times a day (BID) | ORAL | 0 refills | Status: DC

## 2018-02-07 NOTE — Telephone Encounter (Signed)
Called the patient's husband to let him know that there is a prescription for Asyah's dexamethasone here at the clinic waiting for pick up.  Left my call back number in the event he has further questions or concerns.  Will continue to follow as necessary.  Gloriajean Dell. Leonie Green, BSN

## 2018-02-08 ENCOUNTER — Telehealth: Payer: Self-pay | Admitting: Radiation Therapy

## 2018-02-08 NOTE — Telephone Encounter (Signed)
Left a message on Mr. Quang cell letting him know that Dr. Lisbeth Renshaw recommended Kennyth Lose resume steroids and see if her symptoms improve. If they do not then we will move her MRI up to be done sooner. An RX has been printed out for him to take to the nursing home Chairty is staying in. I instructed him to come by and pick this up and to reach back out to me on Tuesday 2/11 to see how if her symptoms have improved or if we need to reschedule her scan.    Mont Dutton R.T.(R)(T) Special Procedures Navigator

## 2018-02-10 ENCOUNTER — Ambulatory Visit: Payer: Self-pay | Admitting: Family Medicine

## 2018-02-13 ENCOUNTER — Telehealth: Payer: Self-pay

## 2018-02-13 ENCOUNTER — Inpatient Hospital Stay: Payer: Medicare Other | Attending: Oncology | Admitting: Oncology

## 2018-02-13 ENCOUNTER — Telehealth: Payer: Self-pay | Admitting: Oncology

## 2018-02-13 VITALS — BP 135/87 | HR 103 | Temp 98.5°F | Resp 19 | Ht 64.0 in

## 2018-02-13 DIAGNOSIS — Z903 Acquired absence of stomach [part of]: Secondary | ICD-10-CM | POA: Insufficient documentation

## 2018-02-13 DIAGNOSIS — C7931 Secondary malignant neoplasm of brain: Secondary | ICD-10-CM

## 2018-02-13 DIAGNOSIS — Z9221 Personal history of antineoplastic chemotherapy: Secondary | ICD-10-CM | POA: Insufficient documentation

## 2018-02-13 DIAGNOSIS — C7802 Secondary malignant neoplasm of left lung: Secondary | ICD-10-CM | POA: Diagnosis present

## 2018-02-13 DIAGNOSIS — C642 Malignant neoplasm of left kidney, except renal pelvis: Secondary | ICD-10-CM | POA: Diagnosis not present

## 2018-02-13 DIAGNOSIS — Z79899 Other long term (current) drug therapy: Secondary | ICD-10-CM | POA: Diagnosis not present

## 2018-02-13 DIAGNOSIS — J449 Chronic obstructive pulmonary disease, unspecified: Secondary | ICD-10-CM | POA: Diagnosis not present

## 2018-02-13 DIAGNOSIS — D649 Anemia, unspecified: Secondary | ICD-10-CM | POA: Diagnosis not present

## 2018-02-13 DIAGNOSIS — F17210 Nicotine dependence, cigarettes, uncomplicated: Secondary | ICD-10-CM | POA: Diagnosis not present

## 2018-02-13 NOTE — Telephone Encounter (Signed)
error 

## 2018-02-13 NOTE — Telephone Encounter (Signed)
Scheduled appt per 2/10 los.  Printed and calendar and avs.

## 2018-02-13 NOTE — Progress Notes (Signed)
[facility] OFFICE PROGRESS NOTE   Diagnosis: Lung cancer  INTERVAL HISTORY:   Ms. Koone returns for a scheduled visit.  She developed progressive left-sided weakness when she was tapered off of Decadron last month.  She restarted Decadron 24/2020.  The left-sided weakness persists.  She complains of chronic pain in the legs and abdomen.  She and her husband are concerned she is not receiving scheduled oxycodone.  The left foot ulcer is healing.  Objective:  Vital signs in last 24 hours:  Blood pressure 135/87, pulse (!) 103, temperature 98.5 F (36.9 C), temperature source Oral, resp. rate 19, height _0  (1.626 m).    HEENT: No thrush Resp: Distant breath sounds Cardio: Regular rate and rhythm Vascular: Trace edema at the left hand, left lower leg, and left foot Neuro: 1-2/5 strength in the left arm, left hand, left leg, and left foot    Lab Results:  Lab Results  Component Value Date   WBC 15.3 (H) 02/06/2018   HGB 9.6 (L) 02/06/2018   HCT 30.9 (L) 02/06/2018   MCV 87.0 02/06/2018   PLT 276 02/06/2018   NEUTROABS 13.1 (H) 02/06/2018    CMP  Lab Results  Component Value Date   NA 135 02/06/2018   K 3.7 02/06/2018   CL 98 02/06/2018   CO2 27 02/06/2018   GLUCOSE 81 02/06/2018   BUN 14 02/06/2018   CREATININE 0.88 02/06/2018   CALCIUM 9.7 02/06/2018   PROT 7.2 02/06/2018   ALBUMIN 2.6 (L) 02/06/2018   AST 10 (L) 02/06/2018   ALT 22 02/06/2018   ALKPHOS 101 02/06/2018   BILITOT 0.4 02/06/2018   GFRNONAA >60 02/06/2018   GFRAA >60 02/06/2018    Lab Results  Component Value Date   CEA1 7.08 (H) 10/19/2017    Medications: I have reviewed the patient's current medications.   Assessment/Plan: 1. Left lung mass-CT biopsy 10/10/2017 revealed metastatic adenocarcinoma, focal CDX-2 positivity, TTF-1Napsin A,and cytokeratin 20negative. Positive for cytokeratin 7 and PAX 8 (patchy week)  PD-L1 score 0%, MSS, tumor mutation burden  8,BRIP1, noKRAS , BRAF, or NRASmutation  PET scan 10/03/2017-hypermetabolic 3 similar left lower lobe mass, low level metabolic activity associated with bibasilar nodular and bandlike densities felt to be inflammatory, high activity in the tongue and neck musculature  SBRTto left lung mass beginning 11/01/2017, completed 11/11/2017  Left-sided weakness 11/21/2017, CT/MRI-isolated right frontal metastasis with surrounding edema  SRS to the right frontal metastasis 12/08/2017  CT chest, abdomen, and pelvis 02/06/2018- decreased size of left lower lobe mass, small new nodules in the right upper lobe-infectious versus neoplastic, no evidence of metastatic disease in the abdomen and pelvis  2. COPD 3. Pancreatitis 4. Chronic pain syndrome 5. Left renal cell carcinoma treated with RFA in 2009 and cryoablation in 2012 6. Remote history of heavy alcohol use 7. Ongoing tobacco use 8. History of a partial gastrectomy 9. Anemia 10. Oral candidiasis-improved with Diflucan    Disposition: Ms. Blandino developed increased left-sided weakness when she was tapered off of Decadron last month.  Decadron was resumed last week.  She has some movement in the left arm and leg today.  I recommended she continue physical therapy.  She will continue Decadron as directed by Dr. Lisbeth Renshaw. The restaging CT shows new nodular inflammatory areas in the right lung.  No evidence of a progressive malignancy.  I reviewed the CT images with Ms. Elmore and her husband.  She will return for an office visit in medical oncology  and radiation oncology 03/13/2018.  She is scheduled to undergo a repeat MRI of the brain prior to that visit.  She will continue the current narcotic pain regimen.  Betsy Coder, MD  02/13/2018  10:12 AM

## 2018-02-23 ENCOUNTER — Telehealth: Payer: Self-pay | Admitting: Radiation Therapy

## 2018-02-23 NOTE — Telephone Encounter (Signed)
[facility] to share steroid taper instructions for [name]. I spoke with [name] who instructed to send a fax with written instruction, attention to [facility], another nurse caring for [name]. In addition to this, I called [name] and left a vm informing him of the steroid taper to begin.   Fax sent to [phone], Attention to [facility] at [facility] at [place]   As of 2/20, begin taking 2 mg BID x two weeks, then 2 mg once daily x two weeks, then 2mg  once every other day x two weeks, then stop.     Mont Dutton R.T.(R)(T) Special Procedures Navigator  [phone]

## 2018-03-06 ENCOUNTER — Other Ambulatory Visit: Payer: Self-pay | Admitting: Radiation Therapy

## 2018-03-09 ENCOUNTER — Ambulatory Visit (HOSPITAL_COMMUNITY)
Admission: RE | Admit: 2018-03-09 | Discharge: 2018-03-09 | Disposition: A | Discharge status: Home / Self Care | Payer: Medicare Other | Source: Ambulatory Visit | Attending: Radiation Oncology | Admitting: Radiation Oncology

## 2018-03-09 ENCOUNTER — Other Ambulatory Visit: Payer: Self-pay | Admitting: Radiation Oncology

## 2018-03-09 DIAGNOSIS — C7931 Secondary malignant neoplasm of brain: Secondary | ICD-10-CM

## 2018-03-09 DIAGNOSIS — C7949 Secondary malignant neoplasm of other parts of nervous system: Principal | ICD-10-CM

## 2018-03-13 ENCOUNTER — Other Ambulatory Visit: Payer: Self-pay

## 2018-03-13 ENCOUNTER — Observation Stay (HOSPITAL_COMMUNITY): Payer: Medicare Other

## 2018-03-13 ENCOUNTER — Telehealth: Payer: Self-pay | Admitting: *Deleted

## 2018-03-13 ENCOUNTER — Ambulatory Visit
Admission: RE | Admit: 2018-03-13 | Discharge: 2018-03-13 | Disposition: A | Discharge status: Home / Self Care | Payer: Medicare Other | Source: Ambulatory Visit | Attending: Radiation Oncology | Admitting: Radiation Oncology

## 2018-03-13 ENCOUNTER — Inpatient Hospital Stay (HOSPITAL_COMMUNITY)
Admission: EM | Admit: 2018-03-13 | Discharge: 2018-03-16 | DRG: 871 | Disposition: A | Discharge status: Skilled Nursing Facility | Payer: Medicare Other | Attending: Internal Medicine | Admitting: Internal Medicine

## 2018-03-13 ENCOUNTER — Encounter (HOSPITAL_COMMUNITY): Payer: Self-pay

## 2018-03-13 ENCOUNTER — Emergency Department (HOSPITAL_COMMUNITY): Payer: Medicare Other

## 2018-03-13 ENCOUNTER — Inpatient Hospital Stay: Payer: Medicare Other | Attending: Oncology | Admitting: Oncology

## 2018-03-13 ENCOUNTER — Other Ambulatory Visit: Payer: Self-pay | Admitting: Radiation Oncology

## 2018-03-13 ENCOUNTER — Encounter: Payer: Self-pay | Admitting: Radiation Oncology

## 2018-03-13 VITALS — BP 134/81 | HR 114 | Temp 101.1°F | Resp 24

## 2018-03-13 DIAGNOSIS — Z85528 Personal history of other malignant neoplasm of kidney: Secondary | ICD-10-CM

## 2018-03-13 DIAGNOSIS — F419 Anxiety disorder, unspecified: Secondary | ICD-10-CM | POA: Diagnosis present

## 2018-03-13 DIAGNOSIS — C7802 Secondary malignant neoplasm of left lung: Secondary | ICD-10-CM | POA: Diagnosis not present

## 2018-03-13 DIAGNOSIS — R918 Other nonspecific abnormal finding of lung field: Secondary | ICD-10-CM

## 2018-03-13 DIAGNOSIS — E860 Dehydration: Secondary | ICD-10-CM | POA: Diagnosis not present

## 2018-03-13 DIAGNOSIS — R569 Unspecified convulsions: Secondary | ICD-10-CM | POA: Diagnosis present

## 2018-03-13 DIAGNOSIS — J441 Chronic obstructive pulmonary disease with (acute) exacerbation: Secondary | ICD-10-CM | POA: Diagnosis present

## 2018-03-13 DIAGNOSIS — R6510 Systemic inflammatory response syndrome (SIRS) of non-infectious origin without acute organ dysfunction: Secondary | ICD-10-CM | POA: Diagnosis present

## 2018-03-13 DIAGNOSIS — C7931 Secondary malignant neoplasm of brain: Secondary | ICD-10-CM

## 2018-03-13 DIAGNOSIS — G40909 Epilepsy, unspecified, not intractable, without status epilepticus: Secondary | ICD-10-CM | POA: Insufficient documentation

## 2018-03-13 DIAGNOSIS — Z765 Malingerer [conscious simulation]: Secondary | ICD-10-CM | POA: Insufficient documentation

## 2018-03-13 DIAGNOSIS — K219 Gastro-esophageal reflux disease without esophagitis: Secondary | ICD-10-CM | POA: Diagnosis present

## 2018-03-13 DIAGNOSIS — A419 Sepsis, unspecified organism: Principal | ICD-10-CM | POA: Diagnosis present

## 2018-03-13 DIAGNOSIS — L97909 Non-pressure chronic ulcer of unspecified part of unspecified lower leg with unspecified severity: Secondary | ICD-10-CM

## 2018-03-13 DIAGNOSIS — C7949 Secondary malignant neoplasm of other parts of nervous system: Principal | ICD-10-CM

## 2018-03-13 DIAGNOSIS — Z888 Allergy status to other drugs, medicaments and biological substances status: Secondary | ICD-10-CM

## 2018-03-13 DIAGNOSIS — F17210 Nicotine dependence, cigarettes, uncomplicated: Secondary | ICD-10-CM | POA: Diagnosis not present

## 2018-03-13 DIAGNOSIS — J449 Chronic obstructive pulmonary disease, unspecified: Secondary | ICD-10-CM

## 2018-03-13 DIAGNOSIS — Z8279 Family history of other congenital malformations, deformations and chromosomal abnormalities: Secondary | ICD-10-CM

## 2018-03-13 DIAGNOSIS — Z801 Family history of malignant neoplasm of trachea, bronchus and lung: Secondary | ICD-10-CM | POA: Diagnosis not present

## 2018-03-13 DIAGNOSIS — Z79891 Long term (current) use of opiate analgesic: Secondary | ICD-10-CM

## 2018-03-13 DIAGNOSIS — Z8249 Family history of ischemic heart disease and other diseases of the circulatory system: Secondary | ICD-10-CM

## 2018-03-13 DIAGNOSIS — Z79899 Other long term (current) drug therapy: Secondary | ICD-10-CM | POA: Insufficient documentation

## 2018-03-13 DIAGNOSIS — Z66 Do not resuscitate: Secondary | ICD-10-CM | POA: Diagnosis present

## 2018-03-13 DIAGNOSIS — E43 Unspecified severe protein-calorie malnutrition: Secondary | ICD-10-CM | POA: Diagnosis present

## 2018-03-13 DIAGNOSIS — B3781 Candidal esophagitis: Secondary | ICD-10-CM | POA: Diagnosis present

## 2018-03-13 DIAGNOSIS — B370 Candidal stomatitis: Secondary | ICD-10-CM | POA: Diagnosis present

## 2018-03-13 DIAGNOSIS — E46 Unspecified protein-calorie malnutrition: Secondary | ICD-10-CM | POA: Insufficient documentation

## 2018-03-13 DIAGNOSIS — L97529 Non-pressure chronic ulcer of other part of left foot with unspecified severity: Secondary | ICD-10-CM | POA: Diagnosis present

## 2018-03-13 DIAGNOSIS — C7951 Secondary malignant neoplasm of bone: Secondary | ICD-10-CM | POA: Diagnosis present

## 2018-03-13 DIAGNOSIS — Z720 Tobacco use: Secondary | ICD-10-CM | POA: Diagnosis present

## 2018-03-13 DIAGNOSIS — Z7951 Long term (current) use of inhaled steroids: Secondary | ICD-10-CM

## 2018-03-13 DIAGNOSIS — L89892 Pressure ulcer of other site, stage 2: Secondary | ICD-10-CM | POA: Diagnosis present

## 2018-03-13 DIAGNOSIS — Z800 Family history of malignant neoplasm of digestive organs: Secondary | ICD-10-CM | POA: Insufficient documentation

## 2018-03-13 DIAGNOSIS — G894 Chronic pain syndrome: Secondary | ICD-10-CM

## 2018-03-13 DIAGNOSIS — C801 Malignant (primary) neoplasm, unspecified: Secondary | ICD-10-CM | POA: Diagnosis not present

## 2018-03-13 DIAGNOSIS — J440 Chronic obstructive pulmonary disease with acute lower respiratory infection: Secondary | ICD-10-CM | POA: Insufficient documentation

## 2018-03-13 DIAGNOSIS — D638 Anemia in other chronic diseases classified elsewhere: Secondary | ICD-10-CM | POA: Diagnosis present

## 2018-03-13 DIAGNOSIS — Z681 Body mass index (BMI) 19 or less, adult: Secondary | ICD-10-CM

## 2018-03-13 DIAGNOSIS — J069 Acute upper respiratory infection, unspecified: Secondary | ICD-10-CM

## 2018-03-13 DIAGNOSIS — K859 Acute pancreatitis without necrosis or infection, unspecified: Secondary | ICD-10-CM | POA: Diagnosis present

## 2018-03-13 DIAGNOSIS — R140 Abdominal distension (gaseous): Secondary | ICD-10-CM

## 2018-03-13 DIAGNOSIS — I83009 Varicose veins of unspecified lower extremity with ulcer of unspecified site: Secondary | ICD-10-CM | POA: Diagnosis present

## 2018-03-13 DIAGNOSIS — I10 Essential (primary) hypertension: Secondary | ICD-10-CM | POA: Diagnosis present

## 2018-03-13 DIAGNOSIS — Z9111 Patient's noncompliance with dietary regimen: Secondary | ICD-10-CM

## 2018-03-13 DIAGNOSIS — Z515 Encounter for palliative care: Secondary | ICD-10-CM | POA: Diagnosis present

## 2018-03-13 DIAGNOSIS — J181 Lobar pneumonia, unspecified organism: Secondary | ICD-10-CM | POA: Diagnosis present

## 2018-03-13 DIAGNOSIS — L89319 Pressure ulcer of right buttock, unspecified stage: Secondary | ICD-10-CM

## 2018-03-13 DIAGNOSIS — R1312 Dysphagia, oropharyngeal phase: Secondary | ICD-10-CM | POA: Diagnosis present

## 2018-03-13 DIAGNOSIS — Z825 Family history of asthma and other chronic lower respiratory diseases: Secondary | ICD-10-CM

## 2018-03-13 LAB — CBC WITH DIFFERENTIAL/PLATELET
Abs Immature Granulocytes: 0.12 10*3/uL — ABNORMAL HIGH (ref 0.00–0.07)
BASOS ABS: 0 10*3/uL (ref 0.0–0.1)
BASOS PCT: 0 %
Eosinophils Absolute: 0.1 10*3/uL (ref 0.0–0.5)
Eosinophils Relative: 1 %
HCT: 28.9 % — ABNORMAL LOW (ref 36.0–46.0)
Hemoglobin: 8.1 g/dL — ABNORMAL LOW (ref 12.0–15.0)
Immature Granulocytes: 1 %
Lymphocytes Relative: 5 %
Lymphs Abs: 0.5 10*3/uL — ABNORMAL LOW (ref 0.7–4.0)
MCH: 26.6 pg (ref 26.0–34.0)
MCHC: 28 g/dL — ABNORMAL LOW (ref 30.0–36.0)
MCV: 94.8 fL (ref 80.0–100.0)
Monocytes Absolute: 0.6 10*3/uL (ref 0.1–1.0)
Monocytes Relative: 6 %
NRBC: 0 % (ref 0.0–0.2)
Neutro Abs: 8.2 10*3/uL — ABNORMAL HIGH (ref 1.7–7.7)
Neutrophils Relative %: 87 %
Platelets: 202 10*3/uL (ref 150–400)
RBC: 3.05 MIL/uL — ABNORMAL LOW (ref 3.87–5.11)
RDW: 18.3 % — ABNORMAL HIGH (ref 11.5–15.5)
WBC Morphology: INCREASED
WBC: 9.4 10*3/uL (ref 4.0–10.5)

## 2018-03-13 LAB — COMPREHENSIVE METABOLIC PANEL
ALT: 36 U/L (ref 0–44)
AST: 15 U/L (ref 15–41)
Albumin: 3.1 g/dL — ABNORMAL LOW (ref 3.5–5.0)
Alkaline Phosphatase: 88 U/L (ref 38–126)
Anion gap: 9 (ref 5–15)
BUN: 19 mg/dL (ref 8–23)
CO2: 30 mmol/L (ref 22–32)
CREATININE: 0.67 mg/dL (ref 0.44–1.00)
Calcium: 8.9 mg/dL (ref 8.9–10.3)
Chloride: 101 mmol/L (ref 98–111)
GFR calc non Af Amer: >60 mL/min (ref >60)
Glucose, Bld: 95 mg/dL (ref 70–99)
Potassium: 3.9 mmol/L (ref 3.5–5.1)
Sodium: 140 mmol/L (ref 135–145)
Total Bilirubin: 0.7 mg/dL (ref 0.3–1.2)
Total Protein: 6.5 g/dL (ref 6.5–8.1)

## 2018-03-13 LAB — RAPID URINE DRUG SCREEN, HOSP PERFORMED
Amphetamines: NOT DETECTED
Barbiturates: NOT DETECTED
Benzodiazepines: POSITIVE — AB
Cocaine: NOT DETECTED
OPIATES: POSITIVE — AB
Tetrahydrocannabinol: NOT DETECTED

## 2018-03-13 LAB — BLOOD GAS, VENOUS
Acid-Base Excess: 3.5 mmol/L — ABNORMAL HIGH (ref 0.0–2.0)
Bicarbonate: 29.6 mmol/L — ABNORMAL HIGH (ref 20.0–28.0)
O2 Saturation: 22.6 %
Patient temperature: 98.6
pCO2, Ven: 54.7 mmHg (ref 44.0–60.0)
pH, Ven: 7.352 (ref 7.250–7.430)

## 2018-03-13 LAB — URINALYSIS, ROUTINE W REFLEX MICROSCOPIC
Bilirubin Urine: NEGATIVE
Glucose, UA: NEGATIVE mg/dL
Ketones, ur: NEGATIVE mg/dL
Leukocytes,Ua: NEGATIVE
Nitrite: NEGATIVE
Protein, ur: NEGATIVE mg/dL
SPECIFIC GRAVITY, URINE: 1.009 (ref 1.005–1.030)
pH: 6 (ref 5.0–8.0)

## 2018-03-13 LAB — INFLUENZA PANEL BY PCR (TYPE A & B)
Influenza A By PCR: NEGATIVE
Influenza B By PCR: NEGATIVE

## 2018-03-13 LAB — MAGNESIUM: Magnesium: 1.9 mg/dL (ref 1.7–2.4)

## 2018-03-13 LAB — LACTIC ACID, PLASMA: Lactic Acid, Venous: 1.7 mmol/L (ref 0.5–1.9)

## 2018-03-13 LAB — PHOSPHORUS: Phosphorus: 3.9 mg/dL (ref 2.5–4.6)

## 2018-03-13 MED ORDER — OXYCODONE HCL 5 MG PO TABS
10.0000 mg | ORAL_TABLET | Freq: Four times a day (QID) | ORAL | Status: DC | PRN
  Administered 2018-03-15 – 2018-03-16 (×3): 10 mg via ORAL
  Filled 2018-03-13 (×4): qty 2

## 2018-03-13 MED ORDER — ONDANSETRON HCL 4 MG PO TABS
4.0000 mg | ORAL_TABLET | Freq: Four times a day (QID) | ORAL | Status: DC | PRN
  Administered 2018-03-16: 4 mg via ORAL
  Filled 2018-03-13: qty 1

## 2018-03-13 MED ORDER — DEXAMETHASONE 4 MG PO TABS
4.0000 mg | ORAL_TABLET | Freq: Every day | ORAL | Status: DC
  Filled 2018-03-13: qty 1

## 2018-03-13 MED ORDER — DOCUSATE SODIUM 100 MG PO CAPS
100.0000 mg | ORAL_CAPSULE | Freq: Two times a day (BID) | ORAL | Status: DC
  Administered 2018-03-14 – 2018-03-16 (×3): 100 mg via ORAL
  Filled 2018-03-13 (×5): qty 1

## 2018-03-13 MED ORDER — FENTANYL CITRATE (PF) 100 MCG/2ML IJ SOLN
25.0000 ug | Freq: Once | INTRAMUSCULAR | Status: AC
  Administered 2018-03-13: 25 ug via INTRAVENOUS
  Filled 2018-03-13: qty 2

## 2018-03-13 MED ORDER — VANCOMYCIN HCL IN DEXTROSE 1-5 GM/200ML-% IV SOLN
1000.0000 mg | Freq: Once | INTRAVENOUS | Status: AC
  Administered 2018-03-13: 1000 mg via INTRAVENOUS
  Filled 2018-03-13: qty 200

## 2018-03-13 MED ORDER — MIRTAZAPINE 15 MG PO TABS
45.0000 mg | ORAL_TABLET | Freq: Every day | ORAL | Status: DC
  Administered 2018-03-14 – 2018-03-15 (×2): 45 mg via ORAL
  Filled 2018-03-13 (×3): qty 3

## 2018-03-13 MED ORDER — BISACODYL 10 MG RE SUPP: 10.0000 mg | Freq: Every day | RECTAL | Status: DC | PRN

## 2018-03-13 MED ORDER — SODIUM CHLORIDE 0.9 % IV SOLN
INTRAVENOUS | Status: AC
  Administered 2018-03-14: via INTRAVENOUS

## 2018-03-13 MED ORDER — ONDANSETRON HCL 4 MG/2ML IJ SOLN
4.0000 mg | Freq: Once | INTRAMUSCULAR | Status: AC
  Administered 2018-03-13: 4 mg via INTRAVENOUS
  Filled 2018-03-13: qty 2

## 2018-03-13 MED ORDER — GABAPENTIN 300 MG PO CAPS
300.0000 mg | ORAL_CAPSULE | Freq: Four times a day (QID) | ORAL | Status: DC
  Administered 2018-03-14 – 2018-03-16 (×7): 300 mg via ORAL
  Filled 2018-03-13 (×10): qty 1

## 2018-03-13 MED ORDER — SODIUM CHLORIDE 0.9 % IV SOLN
2.0000 g | Freq: Once | INTRAVENOUS | Status: AC
  Administered 2018-03-13: 2 g via INTRAVENOUS
  Filled 2018-03-13: qty 2

## 2018-03-13 MED ORDER — ALBUTEROL SULFATE (2.5 MG/3ML) 0.083% IN NEBU: 2.5000 mg | INHALATION_SOLUTION | RESPIRATORY_TRACT | Status: DC | PRN

## 2018-03-13 MED ORDER — FLUCONAZOLE IN SODIUM CHLORIDE 200-0.9 MG/100ML-% IV SOLN
200.0000 mg | INTRAVENOUS | Status: DC
  Administered 2018-03-13 – 2018-03-15 (×3): 200 mg via INTRAVENOUS
  Filled 2018-03-13 (×4): qty 100

## 2018-03-13 MED ORDER — METRONIDAZOLE IN NACL 5-0.79 MG/ML-% IV SOLN
500.0000 mg | Freq: Three times a day (TID) | INTRAVENOUS | Status: DC
  Administered 2018-03-14 – 2018-03-16 (×8): 500 mg via INTRAVENOUS
  Filled 2018-03-13 (×8): qty 100

## 2018-03-13 MED ORDER — NYSTATIN 100000 UNIT/ML MT SUSP
5.0000 mL | Freq: Four times a day (QID) | OROMUCOSAL | Status: DC
  Administered 2018-03-15 – 2018-03-16 (×2): 500000 [IU] via OROMUCOSAL
  Filled 2018-03-13 (×6): qty 5

## 2018-03-13 MED ORDER — POLYETHYLENE GLYCOL 3350 17 G PO PACK: 17.0000 g | PACK | Freq: Every day | ORAL | Status: DC | PRN

## 2018-03-13 MED ORDER — IPRATROPIUM BROMIDE 0.02 % IN SOLN
0.5000 mg | Freq: Four times a day (QID) | RESPIRATORY_TRACT | Status: DC
  Filled 2018-03-13: qty 2.5

## 2018-03-13 MED ORDER — SODIUM CHLORIDE 0.9 % IV BOLUS (SEPSIS)
1000.0000 mL | Freq: Once | INTRAVENOUS | Status: AC
  Administered 2018-03-13: 1000 mL via INTRAVENOUS

## 2018-03-13 MED ORDER — OXYCODONE HCL ER 20 MG PO T12A
20.0000 mg | EXTENDED_RELEASE_TABLET | Freq: Two times a day (BID) | ORAL | Status: DC
  Administered 2018-03-14 – 2018-03-16 (×5): 20 mg via ORAL
  Filled 2018-03-13 (×6): qty 1

## 2018-03-13 MED ORDER — ACETAMINOPHEN 325 MG PO TABS: 650.0000 mg | ORAL_TABLET | Freq: Four times a day (QID) | ORAL | Status: DC | PRN

## 2018-03-13 MED ORDER — ACETAMINOPHEN 650 MG RE SUPP
650.0000 mg | Freq: Four times a day (QID) | RECTAL | Status: DC | PRN
  Administered 2018-03-14: 650 mg via RECTAL
  Filled 2018-03-13: qty 1

## 2018-03-13 MED ORDER — THIAMINE HCL 100 MG/ML IJ SOLN
100.0000 mg | Freq: Every day | INTRAMUSCULAR | Status: DC
  Administered 2018-03-14: 100 mg via INTRAVENOUS
  Filled 2018-03-13: qty 2

## 2018-03-13 MED ORDER — IPRATROPIUM-ALBUTEROL 0.5-2.5 (3) MG/3ML IN SOLN: 3.0000 mL | Freq: Three times a day (TID) | RESPIRATORY_TRACT | Status: DC | PRN

## 2018-03-13 MED ORDER — DIAZEPAM 5 MG PO TABS
5.0000 mg | ORAL_TABLET | Freq: Three times a day (TID) | ORAL | Status: DC | PRN
  Administered 2018-03-15 – 2018-03-16 (×2): 5 mg via ORAL
  Filled 2018-03-13 (×2): qty 1

## 2018-03-13 MED ORDER — SODIUM CHLORIDE 0.9 % IV BOLUS (SEPSIS)
250.0000 mL | Freq: Once | INTRAVENOUS | Status: AC
  Administered 2018-03-13: 250 mL via INTRAVENOUS

## 2018-03-13 MED ORDER — FAMOTIDINE 20 MG PO TABS
20.0000 mg | ORAL_TABLET | Freq: Every day | ORAL | Status: DC
  Administered 2018-03-14 – 2018-03-15 (×2): 20 mg via ORAL
  Filled 2018-03-13 (×3): qty 1

## 2018-03-13 MED ORDER — GUAIFENESIN ER 600 MG PO TB12
600.0000 mg | ORAL_TABLET | Freq: Two times a day (BID) | ORAL | Status: DC
  Administered 2018-03-16: 600 mg via ORAL
  Filled 2018-03-13 (×5): qty 1

## 2018-03-13 MED ORDER — ONDANSETRON HCL 4 MG/2ML IJ SOLN
4.0000 mg | Freq: Four times a day (QID) | INTRAMUSCULAR | Status: DC | PRN
  Administered 2018-03-15: 4 mg via INTRAVENOUS
  Filled 2018-03-13: qty 2

## 2018-03-13 MED ORDER — METRONIDAZOLE IN NACL 5-0.79 MG/ML-% IV SOLN
500.0000 mg | Freq: Once | INTRAVENOUS | Status: AC
  Administered 2018-03-13: 500 mg via INTRAVENOUS
  Filled 2018-03-13: qty 100

## 2018-03-13 MED ORDER — DEXAMETHASONE 4 MG PO TABS: 4.0000 mg | ORAL_TABLET | ORAL | Status: DC

## 2018-03-13 NOTE — ED Notes (Signed)
Due to difficulty starting IV Antibiotics started without collecting second set of blood cultures to prevent delay in care.

## 2018-03-13 NOTE — ED Notes (Signed)
Lab asking for repeat CBC, previous tube sent had clotted.

## 2018-03-13 NOTE — Progress Notes (Signed)
A consult was received from an ED physician for vancomycin and cefepime per pharmacy dosing.  The patient's profile has been reviewed for ht/wt/allergies/indication/available labs.   A one time order has been placed for vancomycin 1 gm and cefepime 2 gm and flagyl 500 mg ordered by MD.   Further antibiotics/pharmacy consults should be ordered by admitting physician if indicated.                       Thank you,  Eudelia Bunch, Pharm.D [phone] 03/13/2018 4:58 PM

## 2018-03-13 NOTE — ED Triage Notes (Signed)
Coming from cancer center Was coming to review recent MRI of brain   Checked VS and were abnormal   Temp 101 HR-114  Congestion, expiratory wheezing   From [place] [facility]   3 months out from brain radiation   PA called facility and facility said this is new from last night. Per facility vs were normal last night.   A/Ox3 per cancer center nurse  Husband is on the way and usually answers for patient.   Cancer patient unsure of DNR

## 2018-03-13 NOTE — ED Notes (Signed)
Per main lab CBC needs recollected related to clot

## 2018-03-13 NOTE — Progress Notes (Addendum)
Pt refused to have blood drawn @ 2310. Pt refused all night time meds.Pt refused blood draw @ 0500. Pt refused EKG.

## 2018-03-13 NOTE — ED Notes (Signed)
ED TO INPATIENT HANDOFF REPORT  ED Nurse Name and Phone #: Christinia Gully Name/Age/Gender [name] 68 y.o. female Room/Bed: WA08/WA08  Code Status   Code Status: DNR  Home/SNF/Other Given to floor Patient oriented to: self, place, time and situation Is this baseline? Yes   Triage Complete: Triage complete  Chief Complaint Oncology Pt. High Temp./Congestion/Thrush  Triage Note Coming from cancer center Was coming to review recent MRI of brain   Checked VS and were abnormal   Temp 101 HR-114  Congestion, expiratory wheezing   From [place] [facility]   3 months out from brain radiation   PA called facility and facility said this is new from last night. Per facility vs were normal last night.   A/Ox3 per cancer center nurse  Husband is on the way and usually answers for patient.   Cancer patient unsure of DNR    Allergies Allergies  Allergen Reactions  . Aspirin Other (See Comments)    Break out in welts  . Chlorpromazine Hcl Other (See Comments)    Muscle spasms - "really bad"    Level of Care/Admitting Diagnosis ED Disposition    ED Disposition Condition Comment   Admit  Hospital Area: [place] [[id number]]  Level of Care: Telemetry [5]  Admit to tele based on following criteria: Other see comments  Comments: sepsis  Diagnosis: SIRS (systemic inflammatory response syndrome) ([place]) [338250]  Admitting Physician: Toy Baker [[id number]]  Attending Physician: Toy Baker [[id number]]  PT Class (Do Not Modify): Observation [104]  PT Acc Code (Do Not Modify): Observation [10022]       B Medical/Surgery History Past Medical History:  Diagnosis Date  . Cancer ([place])    renal ca  . COPD (chronic obstructive pulmonary disease) ([place])   . Drug-seeking behavior   . Pancreatitis   . Pancreatitis   . Seizures ([place])   . Substance abuse [facility])    Past Surgical History:  Procedure Laterality Date  . HIP SURGERY Bilateral   .  IR GENERIC HISTORICAL  05/08/2015   IR RADIOLOGIST EVAL & MGMT 05/08/2015 Aletta Edouard, MD [facility] INTERV RAD  . IR GENERIC HISTORICAL  01/08/2014   IR RADIOLOGIST EVAL & MGMT 01/08/2014 Aletta Edouard, MD [facility] INTERV RAD  . IR RADIOLOGY PERIPHERAL GUIDED IV START  10/03/2017  . IR RADIOLOGY PERIPHERAL GUIDED IV START  10/10/2017  . IR US GUIDE VASC ACCESS LEFT  02/06/2018  . IR US GUIDE VASC ACCESS RIGHT  10/05/2017  . IR US GUIDE VASC ACCESS RIGHT  10/10/2017  . IR VENIPUNCTURE 68YRS/OLDER BY MD  02/06/2018  . KIDNEY SURGERY     removed cancerous lesions  . PARTIAL GASTRECTOMY       A IV Location/Drains/Wounds Patient Lines/Drains/Airways Status   Active Line/Drains/Airways    Name:   Placement date:   Placement time:   Site:   Days:   Peripheral IV 12/10/17 Right;Upper Arm   12/10/17    1531    Arm   93   Peripheral IV 02/06/18 Left Arm   02/06/18    1037    Arm   35   Peripheral IV 03/13/18 Right;Upper Arm   03/13/18    1646    Arm   less than 1   Wound / Incision (Open or Dehisced) 12/12/17 Laceration Foot Left   12/12/17    1215    Foot   91          Intake/Output Last 24  hours  Intake/Output Summary (Last 24 hours) at 03/13/2018 2216 Last data filed at 03/13/2018 2010 Gross per 24 hour  Intake 1650 ml  Output -  Net 1650 ml    Labs/Imaging Results for orders placed or performed during the hospital encounter of 03/13/18 (from the past 48 hour(s))  Lactic acid, plasma     Status: None   Collection Time: 03/13/18  4:32 PM  Result Value Ref Range   Lactic Acid, Venous 1.7 0.5 - 1.9 mmol/L    Comment: Performed at [facility], [place] [address] [place], [place] [address]  Blood Culture (routine x 2)     Status: None (Preliminary result)   Collection Time: 03/13/18  4:32 PM  Result Value Ref Range   Specimen Description      BLOOD RIGHT ANTECUBITAL Performed at [facility], [place] [address] [place], [place] [address]    Special Requests      BOTTLES DRAWN  AEROBIC AND ANAEROBIC Blood Culture results may not be optimal due to an excessive volume of blood received in culture bottles Performed at [place] [address] [place], [place] [address]    Culture PENDING    Report Status PENDING   Comprehensive metabolic panel     Status: Abnormal   Collection Time: 03/13/18  4:43 PM  Result Value Ref Range   Sodium 140 135 - 145 mmol/L   Potassium 3.9 3.5 - 5.1 mmol/L   Chloride 101 98 - 111 mmol/L   CO2 30 22 - 32 mmol/L   Glucose, Bld 95 70 - 99 mg/dL   BUN 19 8 - 23 mg/dL   Creatinine, Ser 0.67 0.44 - 1.00 mg/dL   Calcium 8.9 8.9 - 10.3 mg/dL   Total Protein 6.5 6.5 - 8.1 g/dL   Albumin 3.1 (L) 3.5 - 5.0 g/dL   AST 15 15 - 41 U/L   ALT 36 0 - 44 U/L   Alkaline Phosphatase 88 38 - 126 U/L   Total Bilirubin 0.7 0.3 - 1.2 mg/dL   GFR calc non Af Amer >60 >60 mL/min   GFR calc Af Amer >60 >60 mL/min   Anion gap 9 5 - 15    Comment: Performed at [facility], [place] [address], [place], [place] [address]  CBC WITH DIFFERENTIAL     Status: None   Collection Time: 03/13/18  4:43 PM  Result Value Ref Range   WBC  4.0 - 10.5 K/uL    QUESTIONABLE RESULTS, RECOMMEND RECOLLECT TO VERIFY    Comment: SPECIMEN CLOTTED NOTIFIED [place] ED AT 1812 ON 03/13/18 BY N.THOMPSON CORRECTED ON 03/09 AT 1818: PREVIOUSLY REPORTED AS 10.4    RBC  3.87 - 5.11 MIL/uL    QUESTIONABLE RESULTS, RECOMMEND RECOLLECT TO VERIFY    Comment: SPECIMEN CLOTTED CORRECTED ON 03/09 AT 1818: PREVIOUSLY REPORTED AS 3.45    Hemoglobin  12.0 - 15.0 g/dL    QUESTIONABLE RESULTS, RECOMMEND RECOLLECT TO VERIFY    Comment: SPECIMEN CLOTTED CORRECTED ON 03/09 AT 1818: PREVIOUSLY REPORTED AS 9.4    HCT  36.0 - 46.0 %    QUESTIONABLE RESULTS, RECOMMEND RECOLLECT TO VERIFY    Comment: SPECIMEN CLOTTED CORRECTED ON 03/09 AT 1818: PREVIOUSLY REPORTED AS 31.9    MCV  80.0 - 100.0 fL    QUESTIONABLE RESULTS, RECOMMEND RECOLLECT TO VERIFY     Comment: SPECIMEN CLOTTED CORRECTED ON 03/09 AT 1818: PREVIOUSLY REPORTED AS 92.5    MCH  26.0 - 34.0 pg    QUESTIONABLE RESULTS, RECOMMEND RECOLLECT TO VERIFY    Comment: SPECIMEN CLOTTED CORRECTED ON 03/09 AT 1818: PREVIOUSLY REPORTED AS 27.2    MCHC  30.0 - 36.0 g/dL    QUESTIONABLE RESULTS, RECOMMEND RECOLLECT TO VERIFY    Comment: SPECIMEN CLOTTED CORRECTED ON 03/09 AT 1818: PREVIOUSLY REPORTED AS 29.5    RDW  11.5 - 15.5 %    QUESTIONABLE RESULTS, RECOMMEND RECOLLECT TO VERIFY    Comment: SPECIMEN CLOTTED CORRECTED ON 03/09 AT 1818: PREVIOUSLY REPORTED AS 18.4    Platelets  150 - 400 K/uL    QUESTIONABLE RESULTS, RECOMMEND RECOLLECT TO VERIFY    Comment: SPECIMEN CLOTTED CORRECTED ON 03/09 AT 1818: PREVIOUSLY REPORTED AS 184    nRBC  0.0 - 0.2 %    QUESTIONABLE RESULTS, RECOMMEND RECOLLECT TO VERIFY    Comment: SPECIMEN CLOTTED Performed at [facility], [place] [address] [place], [place] [address] CORRECTED ON 03/09 AT 1818: PREVIOUSLY REPORTED AS 0.3    Neutrophils Relative % SPECIMEN CLOTTED %   Neutro Abs SPECIMEN CLOTTED 1.7 - 7.7 K/uL   Band Neutrophils SPECIMEN CLOTTED %   Lymphocytes Relative SPECIMEN CLOTTED %   Lymphs Abs SPECIMEN CLOTTED 0.7 - 4.0 K/uL   Monocytes Relative SPECIMEN CLOTTED %   Monocytes Absolute SPECIMEN CLOTTED 0.1 - 1.0 K/uL   Eosinophils Relative SPECIMEN CLOTTED %   Eosinophils Absolute SPECIMEN CLOTTED 0.0 - 0.5 K/uL   Basophils Relative SPECIMEN CLOTTED %   Basophils Absolute SPECIMEN CLOTTED 0.0 - 0.1 K/uL   WBC Morphology SPECIMEN CLOTTED    RBC Morphology SPECIMEN CLOTTED    Smear Review SPECIMEN CLOTTED    Other SPECIMEN CLOTTED %   nRBC SPECIMEN CLOTTED 0 /100 WBC   Metamyelocytes Relative SPECIMEN CLOTTED %   Myelocytes SPECIMEN CLOTTED %   Promyelocytes Relative SPECIMEN CLOTTED %   Blasts SPECIMEN CLOTTED %  Magnesium     Status: None   Collection Time: 03/13/18  4:43 PM  Result Value Ref Range    Magnesium 1.9 1.7 - 2.4 mg/dL    Comment: Performed at [facility], [place] [address] [place], [place] [address]  Phosphorus     Status: None   Collection Time: 03/13/18  4:43 PM  Result Value Ref Range   Phosphorus 3.9 2.5 - 4.6 mg/dL    Comment: Performed at [facility], [place] [address] [place], [place] [address]  Influenza panel by PCR (type A & B)     Status: None   Collection Time: 03/13/18  5:32 PM  Result Value Ref Range   Influenza A By PCR NEGATIVE NEGATIVE   Influenza B By PCR NEGATIVE NEGATIVE    Comment: (NOTE) The Xpert Xpress Flu assay is intended as an aid in the diagnosis of  influenza and should not be used as a sole basis for treatment.  This  assay is FDA approved for nasopharyngeal swab specimens only. Nasal  washings and aspirates are unacceptable for Xpert Xpress Flu testing. Performed at [facility], [place] [address] [place], [place] [address]   CBC with Differential/Platelet     Status: None   Collection Time: 03/13/18  6:51 PM  Result Value Ref Range   WBC SPECIMEN CLOTTED 4.0 - 10.5 K/uL    Comment: Valeta Harms RN 1949 03/13/18 A NAVARRO CORRECTED ON 03/09 AT 1952: PREVIOUSLY REPORTED AS 9.9    RBC SPECIMEN CLOTTED 3.87 - 5.11 MIL/uL  Comment: CORRECTED ON 03/09 AT 1952: PREVIOUSLY REPORTED AS 3.09   Hemoglobin SPECIMEN CLOTTED 12.0 - 15.0 g/dL    Comment: CORRECTED ON 03/09 AT 1952: PREVIOUSLY REPORTED AS 8.3   HCT SPECIMEN CLOTTED 36.0 - 46.0 %    Comment: CORRECTED ON 03/09 AT 1952: PREVIOUSLY REPORTED AS 29.3   MCV SPECIMEN CLOTTED 80.0 - 100.0 fL    Comment: CORRECTED ON 03/09 AT 1952: PREVIOUSLY REPORTED AS 94.8   MCH SPECIMEN CLOTTED 26.0 - 34.0 pg    Comment: CORRECTED ON 03/09 AT 1952: PREVIOUSLY REPORTED AS 26.9   MCHC SPECIMEN CLOTTED 30.0 - 36.0 g/dL    Comment: CORRECTED ON 03/09 AT 1952: PREVIOUSLY REPORTED AS 28.3   RDW SPECIMEN CLOTTED 11.5 - 15.5 %    Comment: CORRECTED  ON 03/09 AT 1952: PREVIOUSLY REPORTED AS 18.3   Platelets SPECIMEN CLOTTED 150 - 400 K/uL    Comment: CORRECTED ON 03/09 AT 1952: PREVIOUSLY REPORTED AS 194   nRBC SPECIMEN CLOTTED 0.0 - 0.2 %    Comment: Performed at [facility], [place] [address], [place], [place] [address] CORRECTED ON 03/09 AT 1952: PREVIOUSLY REPORTED AS 0.0    Neutrophils Relative % SPECIMEN CLOTTED %   Neutro Abs SPECIMEN CLOTTED 1.7 - 7.7 K/uL   Band Neutrophils SPECIMEN CLOTTED %   Lymphocytes Relative SPECIMEN CLOTTED %   Lymphs Abs SPECIMEN CLOTTED 0.7 - 4.0 K/uL   Monocytes Relative SPECIMEN CLOTTED %   Monocytes Absolute SPECIMEN CLOTTED 0.1 - 1.0 K/uL   Eosinophils Relative SPECIMEN CLOTTED %   Eosinophils Absolute SPECIMEN CLOTTED 0.0 - 0.5 K/uL   Basophils Relative SPECIMEN CLOTTED %   Basophils Absolute SPECIMEN CLOTTED 0.0 - 0.1 K/uL   WBC Morphology SPECIMEN CLOTTED    RBC Morphology SPECIMEN CLOTTED    Smear Review SPECIMEN CLOTTED    Other SPECIMEN CLOTTED %   nRBC SPECIMEN CLOTTED 0 /100 WBC   Metamyelocytes Relative SPECIMEN CLOTTED %   Myelocytes SPECIMEN CLOTTED %   Promyelocytes Relative SPECIMEN CLOTTED %   Blasts SPECIMEN CLOTTED %  Urinalysis, Routine w reflex microscopic     Status: Abnormal   Collection Time: 03/13/18  6:55 PM  Result Value Ref Range   Color, Urine YELLOW YELLOW   APPearance CLEAR CLEAR   Specific Gravity, Urine 1.009 1.005 - 1.030   pH 6.0 5.0 - 8.0   Glucose, UA NEGATIVE NEGATIVE mg/dL   Hgb urine dipstick MODERATE (A) NEGATIVE   Bilirubin Urine NEGATIVE NEGATIVE   Ketones, ur NEGATIVE NEGATIVE mg/dL   Protein, ur NEGATIVE NEGATIVE mg/dL   Nitrite NEGATIVE NEGATIVE   Leukocytes,Ua NEGATIVE NEGATIVE   RBC / HPF 11-20 0 - 5 RBC/hpf   WBC, UA 0-5 0 - 5 WBC/hpf   Bacteria, UA RARE (A) NONE SEEN    Comment: Performed at [facility], [place] [address], [place], [place] [address]  CBC with Differential/Platelet     Status:  Abnormal   Collection Time: 03/13/18  8:02 PM  Result Value Ref Range   WBC 9.4 4.0 - 10.5 K/uL   RBC 3.05 (L) 3.87 - 5.11 MIL/uL   Hemoglobin 8.1 (L) 12.0 - 15.0 g/dL   HCT 28.9 (L) 36.0 - 46.0 %   MCV 94.8 80.0 - 100.0 fL   MCH 26.6 26.0 - 34.0 pg   MCHC 28.0 (L) 30.0 - 36.0 g/dL   RDW 18.3 (H) 11.5 - 15.5 %   Platelets 202 150 - 400 K/uL   nRBC  0.0 0.0 - 0.2 %   Neutrophils Relative % 87 %   Neutro Abs 8.2 (H) 1.7 - 7.7 K/uL   Lymphocytes Relative 5 %   Lymphs Abs 0.5 (L) 0.7 - 4.0 K/uL   Monocytes Relative 6 %   Monocytes Absolute 0.6 0.1 - 1.0 K/uL   Eosinophils Relative 1 %   Eosinophils Absolute 0.1 0.0 - 0.5 K/uL   Basophils Relative 0 %   Basophils Absolute 0.0 0.0 - 0.1 K/uL   WBC Morphology INCREASED BANDS (>20% BANDS)     Comment: MILD LEFT SHIFT (1-5% METAS, OCC MYELO, OCC BANDS) TOXIC GRANULATION    Immature Granulocytes 1 %   Abs Immature Granulocytes 0.12 (H) 0.00 - 0.07 K/uL    Comment: Performed at [facility], [place] [address], [place], [place] [address]  Urine rapid drug screen (hosp performed)     Status: Abnormal   Collection Time: 03/13/18  8:14 PM  Result Value Ref Range   Opiates POSITIVE (A) NONE DETECTED   Cocaine NONE DETECTED NONE DETECTED   Benzodiazepines POSITIVE (A) NONE DETECTED   Amphetamines NONE DETECTED NONE DETECTED   Tetrahydrocannabinol NONE DETECTED NONE DETECTED   Barbiturates NONE DETECTED NONE DETECTED    Comment: (NOTE) DRUG SCREEN FOR MEDICAL PURPOSES ONLY.  IF CONFIRMATION IS NEEDED FOR ANY PURPOSE, NOTIFY LAB WITHIN 5 DAYS. LOWEST DETECTABLE LIMITS FOR URINE DRUG SCREEN Drug Class                     Cutoff (ng/mL) Amphetamine and metabolites    1000 Barbiturate and metabolites    200 Benzodiazepine                 270 Tricyclics and metabolites     300 Opiates and metabolites        300 Cocaine and metabolites        300 THC                            50 Performed at [facility], [place] [address] [place], [place] [address]   Blood gas, venous     Status: Abnormal   Collection Time: 03/13/18  8:55 PM  Result Value Ref Range   pH, Ven 7.352 7.250 - 7.430   pCO2, Ven 54.7 44.0 - 60.0 mmHg   pO2, Ven BELOW REPORTABLE RANGE 32.0 - 45.0 mmHg    Comment: CRITICAL RESULT CALLED TO, READ BACK BY AND VERIFIED WITH: Welton Flakes, RN AT 2105 BY T. KNAPP, RRT, RCP ON 03/13/18    Bicarbonate 29.6 (H) 20.0 - 28.0 mmol/L   Acid-Base Excess 3.5 (H) 0.0 - 2.0 mmol/L   O2 Saturation 22.6 %   Patient temperature 98.6    Collection site VEIN    Drawn by Charleen Kirks, RN    Sample type VEIN     Comment: Performed at [facility], [place] [address], [place], [place] [address]   Dg Chest Port 1 View  Result Date: 03/13/2018 CLINICAL DATA:  68 y/o F; sepsis. History of metastatic adenocarcinoma of unknown primary. EXAM: PORTABLE CHEST 1 VIEW COMPARISON:  02/06/2018 CT chest. FINDINGS: The heart size and mediastinal contours are within normal limits. Aortic calcific atherosclerosis. Reticulonodular opacities in the lung bases compatible with bronchitis/bronchiolitis similar to prior CT of chest. Left basilar consolidation appears improved. No new consolidation, effusion, or pneumothorax. No acute osseous abnormality is evident. IMPRESSION: Reticulonodular  opacities in the lung bases compatible with bronchitis/bronchiolitis similar to prior CT of chest. Left basilar consolidation appears improved given differences in technique. No new consolidation. Electronically Signed   By: Kristine Garbe M.D.   On: 03/13/2018 17:14    Pending Labs Unresulted Labs (From admission, onward)    Start     Ordered   03/14/18 0500  Prealbumin  Tomorrow morning,   R     03/13/18 2045   03/13/18 1805  CBC with Differential/Platelet  Once,   STAT     03/13/18 1805   03/13/18 1632  Lactic acid, plasma  Now then every 2 hours,   STAT     03/13/18 1634   03/13/18 1632  Blood  Culture (routine x 2)  BLOOD CULTURE X 2,   STAT     03/13/18 1634   Signed and Held  Magnesium  Tomorrow morning,   R    Comments:  Call MD if <1.5    Signed and Held   Signed and Held  Phosphorus  Tomorrow morning,   R     Signed and Held   Signed and Held  TSH  Once,   R    Comments:  Cancel if already done within 1 month and notify MD    Signed and Held   Signed and Held  Comprehensive metabolic panel  Once,   R    Comments:  Cal MD for K<3.5 or >5.0    Signed and Held   Signed and Held  CBC  Once,   R    Comments:  Call for hg <8.0    Signed and Held   Signed and Held  Culture, sputum-assessment  Once,   R    Question:  Patient immune status  Answer:  Immunocompromised   Signed and Held   Signed and Held  Procalcitonin  Add-on,   R     Signed and Held          Vitals/Pain Today's Vitals   03/13/18 1930 03/13/18 2000 03/13/18 2130 03/13/18 2200  BP: 139/85 (!) 142/86 130/84 121/81  Pulse:  87  80  Resp: 19 18 20 20   Temp:  98.5 F (36.9 C)    TempSrc:  Oral    SpO2:  100% 100% 100%  PainSc:        Isolation Precautions Droplet precaution  Medications Medications  fluconazole (DIFLUCAN) IVPB 200 mg (200 mg Intravenous New Bag/Given 03/13/18 2157)  nystatin (MYCOSTATIN) 100000 UNIT/ML suspension 500,000 Units (has no administration in time range)  sodium chloride 0.9 % bolus 1,000 mL (0 mLs Intravenous Stopped 03/13/18 2010)    And  sodium chloride 0.9 % bolus 250 mL (0 mLs Intravenous Stopped 03/13/18 1731)  ceFEPIme (MAXIPIME) 2 g in sodium chloride 0.9 % 100 mL IVPB (0 g Intravenous Stopped 03/13/18 1841)  metroNIDAZOLE (FLAGYL) IVPB 500 mg (0 mg Intravenous Stopped 03/13/18 1842)  vancomycin (VANCOCIN) IVPB 1000 mg/200 mL premix (0 mg Intravenous Stopped 03/13/18 2010)  fentaNYL (SUBLIMAZE) injection 25 mcg (25 mcg Intravenous Given 03/13/18 1723)  ondansetron (ZOFRAN) injection 4 mg (4 mg Intravenous Given 03/13/18 1725)    Mobility non-ambulatory High fall risk    Focused Assessments Cardiac Assessment Handoff:    Lab Results  Component Value Date   CKTOTAL 39 11/21/2017   CKMB 1.9 11/27/2010   TROPONINI <0.03 12/28/2014   Lab Results  Component Value Date   DDIMER 0.61 (H) 10/06/2017   Does the Patient currently have chest  pain? No  , Pulmonary Assessment Handoff:  Lung sounds:   O2 Device: Room Air O2 Flow Rate (L/min): 2 L/min      R Recommendations: See Admitting Provider Note  Report given to:   Additional Notes: Was told the pt has some dementia, and may not be . A&O 4x 24/7. Husband usually answers questions regarding pts healthcare.

## 2018-03-13 NOTE — Progress Notes (Addendum)
Radiation Oncology         [phone] ________________________________  Name: [name] MRN: [id number]  Date of Service: 03/13/2018 DOB: [birth year]  Post Treatment Note  CC: Azzie Glatter, FNP  Maryanna Shape, NP  Diagnosis:   Metastatic Adenocarcinoma of unknown primary  Interval Since Last Radiation:  3 months  12/08/2017 SRS Treatment: Brain PTV1: Right Parietal 34mm // 18 Gy in 1 fraction,  11/01/2017-11/11/2017 SBRT Treatment: The tumor in the LLL was treated with a course of stereotactic body radiation treatment. The patient received 60 Gy in 5 fractions at 12 Gy per fraction.  Narrative:  The patient returns today for routine follow-up.  [name]' case remains complicated. She has an adenocarcinoma diagnosed on biopsy of her LLL and there was suspicion by pathology of this being a colonic primary. She has had multiple issues of compliance with providers and suspicious drug seeking behavior which have led to her being fired by several practices. She went on to receive SBRT to the LLL lesion as this was the only site of disease during her initial work up. She was counseled on the need for colonoscopy as well. She did meet with Dr. Paulita Fujita and given her performance status, she was not felt to be a candidate to undergo modified anesthesia and the risk was felt to be higher than the benefit. She also presented with neurologic symptoms and was hospitalized in November 2019 and was found to have a solitary brain metastasis. She underwent SRS for this. She did have symptoms of of left upper and lower extremity weakness and was started on oral steroids with dexamethasone 4 mg TID. She tapered to 4 mg BID and this showed significant improvement. She has been residing in a skilled facility for rehabilitation. She has had several episodes of thrush treated with nystatin suspension and fluconazole. She was tapered further and her dexamethasone was discontinued. She developed LUE and  LLE weakness and loss of function and her husband alerted [place] of this on 02/06/2018. I had been out of the office but suggested going back to Dexamethasone 4 mg BID, and a prescription was given to the husband to take to her facility. Notes from our system indicated it was written on 02/07/2018, and notes documenting the patient's husband receiving this was on 02/08/2018. Per report she had improvement of her symptoms and we made plans to begin a taper again of her steroids. She was supposed to start at 2 mg BID of dexamethasone for 2 weeks as of 02/23/2018. Then to go to 2 mg daily for 2 weeks, then 2 mg every other day for 2 weeks. Instructions were given to the facility to notify us of her symptoms did not improve to let us know so her MRI could be moved up sooner. It is unclear what she took between 02/24/2018 and 03/10/2018, however her husband states that she was doing well until the week after Valentine's day when she started getting weaker. He takes her home some nights and on weekends and reports that last week on 03/03/2018 he was sent home with 1/2 of a 4 mg tablet to give to her once a day. She subsequently underwent an MRI of the brain on 03/09/2018, and while her tumor appeared a few millimeters smaller, her vasogenic edema was significant. Her husband brings her today to review these results.  Past Medical History:  Past Medical History:  Diagnosis Date  . Cancer ([place])    renal ca  . COPD (  chronic obstructive pulmonary disease) ([place])   . Drug-seeking behavior   . Pancreatitis   . Pancreatitis   . Seizures ([place])   . Substance abuse [facility])     Past Surgical History: Past Surgical History:  Procedure Laterality Date  . HIP SURGERY Bilateral   . IR GENERIC HISTORICAL  05/08/2015   IR RADIOLOGIST EVAL & MGMT 05/08/2015 Aletta Edouard, MD [facility] INTERV RAD  . IR GENERIC HISTORICAL  01/08/2014   IR RADIOLOGIST EVAL & MGMT 01/08/2014 Aletta Edouard, MD [facility] INTERV RAD  . IR RADIOLOGY PERIPHERAL GUIDED IV  START  10/03/2017  . IR RADIOLOGY PERIPHERAL GUIDED IV START  10/10/2017  . IR US GUIDE VASC ACCESS LEFT  02/06/2018  . IR US GUIDE VASC ACCESS RIGHT  10/05/2017  . IR US GUIDE VASC ACCESS RIGHT  10/10/2017  . IR VENIPUNCTURE 10YRS/OLDER BY MD  02/06/2018  . KIDNEY SURGERY     removed cancerous lesions  . PARTIAL GASTRECTOMY      Social History:  Social History   Socioeconomic History  . Marital status: Married    Spouse name: Not on file  . Number of children: Not on file  . Years of education: Not on file  . Highest education level: Not on file  Occupational History  . Not on file  Social Needs  . Financial resource strain: Not on file  . Food insecurity:    Worry: Not on file    Inability: Not on file  . Transportation needs:    Medical: No    Non-medical: No  Tobacco Use  . Smoking status: Current Every Day Smoker    Packs/day: 0.50    Years: 30.00    Pack years: 15.00    Types: Cigarettes  . Smokeless tobacco: Never Used  . Tobacco comment: smoking up to 1.5 ppd per husband. down from 3pk day to 1.5   Substance and Sexual Activity  . Alcohol use: Not Currently    Alcohol/week: 0.0 standard drinks    Comment: "I stopped drinking 1 year ago"  . Drug use: No    Comment: Hx of polysubstance drug abuse  . Sexual activity: Never  Lifestyle  . Physical activity:    Days per week: Not on file    Minutes per session: Not on file  . Stress: Not on file  Relationships  . Social connections:    Talks on phone: Not on file    Gets together: Not on file    Attends religious service: Not on file    Active member of club or organization: Not on file    Attends meetings of clubs or organizations: Not on file    Relationship status: Not on file  . Intimate partner violence:    Fear of current or ex partner: Not on file    Emotionally abused: Not on file    Physically abused: Not on file    Forced sexual activity: Not on file  Other Topics Concern  . Not on file  Social  History Narrative  . Not on file  The patient is married and used to live with her husband prior to being in a skilled facility due to her hemiparesis.   Family History: Family History  Problem Relation Age of Onset  . CAD Mother   . Alcoholism Father   . COPD Father   . Lung cancer Father   . Rectal cancer Maternal Uncle  ALLERGIES:  is allergic to aspirin and chlorpromazine hcl.  Meds: Current Outpatient Medications  Medication Sig Dispense Refill  . CHLORHEXIDINE GLUCONATE, BULK, SOLN Take by mouth daily as needed. Topically to mouth daily as needed for thrush    . dexamethasone (DECADRON) 4 MG tablet Take 1 tablet (4 mg total) by mouth 2 (two) times daily. 60 tablet 0  . diazepam (VALIUM) 5 MG tablet Take 1 tablet (5 mg total) by mouth every 6 (six) hours as needed for anxiety. 5 tablet 0  . famotidine (PEPCID) 20 MG tablet Take 1 tablet (20 mg total) by mouth daily. (Patient taking differently: Take 20 mg by mouth at bedtime. ) 30 tablet 0  . feeding supplement, ENSURE ENLIVE, (ENSURE ENLIVE) LIQD Take 237 mLs by mouth 3 (three) times daily between meals. 237 mL 0  . fluconazole (DIFLUCAN) 50 MG tablet Take 50 mg by mouth daily.    Marland Kitchen gabapentin (NEURONTIN) 300 MG capsule Take 300 mg by mouth 4 (four) times daily.   1  . guaiFENesin (ROBITUSSIN) 100 MG/5ML liquid Take 15 mLs by mouth every 6 (six) hours as needed for cough.    Marland Kitchen ipratropium-albuterol (DUONEB) 0.5-2.5 (3) MG/3ML SOLN Take 3 mLs by nebulization every 6 (six) hours as needed. Use 3 times daily x 4 days, then every 6 hours as needed. (Patient taking differently: Take 3 mLs by nebulization 3 (three) times daily as needed (sob and wheezing). ) 360 mL 0  . LORazepam (ATIVAN) 1 MG tablet Take 1 tablet (1 mg total) by mouth every 8 (eight) hours as needed for anxiety. 15 tablet 0  . mirtazapine (REMERON) 45 MG tablet Take 45 mg by mouth at bedtime.   1  . ondansetron (ZOFRAN) 4 MG tablet Take 4 mg  by mouth every 6 (six) hours as needed.    Marland Kitchen oxyCODONE (OXYCONTIN) 20 mg 12 hr tablet Take 1 tablet (20 mg total) by mouth every 12 (twelve) hours. 14 tablet 0  . Oxycodone HCl 10 MG TABS Take 1 tablet (10 mg total) by mouth 4 (four) times daily as needed (pain). 10 tablet 0  . Polyethylene Glycol 3350 (MIRALAX PO) Take 17 g by mouth daily.    Marland Kitchen PROAIR HFA 108 (90 Base) MCG/ACT inhaler Inhale 1 puff into the lungs every 4 (four) hours as needed for wheezing or shortness of breath. 1 Inhaler 5  . promethazine (PHENERGAN) 12.5 MG tablet Take 12.5 mg by mouth every 8 (eight) hours as needed for nausea or vomiting.    . tiotropium (SPIRIVA HANDIHALER) 18 MCG inhalation capsule PLACE 1 CAPSULE INTO INHALER AND INHALE DAILY 30 capsule 0   No current facility-administered medications for this encounter.     Physical Findings:  oral temperature is 101.1 F (38.4 C) (abnormal). Her blood pressure is 134/81 and her pulse is 114 (abnormal). Her respiration is 24 (abnormal) and oxygen saturation is 92%.  Pain Assessment Pain Score: 6 /10 In general this is a thin, cachectic, chronically ill appearing caucasian female in no acute distress. She's alert and oriented to person, location, and self but not to time. She falls asleep during our encounter several times, and her husband provides much of the history. She has cough with gurgling breath sounds at rest. Cardiovascular exam reveals a tachycardic rate but normal rhythm. No clicks, rubs, or murmurs are noted. Chest reveals coarse breath sounds throughout and decreased breath sounds in the right upper and mid lung fields. Decreased breath sounds are noted at the  right base. The left is coarse throughout. No adenopathy is noted in the cervical or supraclavicular sites. Her right arm is bandaged and not disturbed. Her left hand has bruising and excoriations noted. Left lower extremity is wrapped in coban and not disturbed. HEENT reveals no trauma. EOMs are intact  bilaterally. PERRLA. She has diffuse thrush of her tongue and buccal mucosa which appears to also extend to the posterior pharynx.  Lab Findings: Lab Results  Component Value Date   WBC 15.3 (H) 02/06/2018   HGB 9.6 (L) 02/06/2018   HCT 30.9 (L) 02/06/2018   MCV 87.0 02/06/2018   PLT 276 02/06/2018     Radiographic Findings: Mr Brain Wo Contrast  Result Date: 03/09/2018 CLINICAL DATA:  69 year old female with metastatic adenocarcinoma of unknown primary treated with SRS to right parietal brain lesion in December 2019. Recurrent left side weakness, difficulty tapering off of steroids. A study without and with contrast was planned, but the patient has difficult venous access and after multiple unsuccessful IV access attempts she declined IV contrast/IV team attempt. EXAM: MRI HEAD WITHOUT CONTRAST TECHNIQUE: Multiplanar, multiecho pulse sequences of the brain and surrounding structures were obtained without intravenous contrast. COMPARISON:  Brain MRI 11/22/2017 and earlier. FINDINGS: Brain: A bilobed or "figure of 8" shaped dark T2 lesion in the right superior frontal gyrus near the central sulcus measures about 14 x 16 x 26 millimeters (AP by transverse by CC) versus 15 x 15 x 30 millimeters in November. There is restricted diffusion along the margins, and mild new hemosiderin or mineralization within the treated lesion on SWI. But regional confluent T2 and FLAIR hyperintensity most resembling vasogenic edema has progressed since November and is also more extensive than compared to the presentation study on 11/21/2017 (series 9, image 41). Regional mass effect has mildly increased, particularly in the more anterior aspect of the superior frontal gyrus. Still, there is no midline shift or loss of basilar cisterns. No other lesion or restricted diffusion is identified in the absence of contrast. No other areas of vasogenic edema. Stable stable gray and white matter signal elsewhere. No other  intracranial blood products. No restricted diffusion to suggest acute infarction. No ventriculomegaly, extra-axial collection or acute intracranial hemorrhage. Cervicomedullary junction and pituitary are within normal limits. Vascular: Major intracranial vascular flow voids are stable. Skull and upper cervical spine: Negative visible cervical spine. Normal bone marrow signal. Sinuses/Orbits: Stable and negative. Other: Mastoids remain clear. Visible internal auditory structures appear normal. Negative scalp soft tissues. IMPRESSION: 1. Substantially progressed vasogenic edema surrounding the treated right superior frontal gyrus lesion. Mild associated mass effect but no midline shift. No IV contrast could be administered, but based on T2 imaging the lesion size has mildly regressed since November. Consider radiation necrosis. 2. No new metastatic disease identified in the absence of contrast. Electronically Signed   By: Genevie Ann M.D.   On: 03/09/2018 13:47    Impression/Plan: 1. Fever with exam findings concerning for pneumonia. I spent time discussing the exam findings and her vital signs with the patient and her husband. I'm concerned she's developed a pneumonia and needs to be further worked up in an inpatient setting. I have also discussed with Dr. Benay Spice and he recommends proceeding with ED work up to confirm this. We will follow along expectantly. 2. 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 The patient's course has been complex given the location of her disease, the rebound edema and symptoms she had following treatment, and this most  recent concern for rebound edema with another attempted taper of dexamethasone. The patient's dexamethasone dose is currently at 2 mg daily per nursing home records. Unfortunately we were not made aware that her hemiparesis progressed after tapering her from 4 mg BID to 2 mg dosing. Furthermore, it is unclear from records if she was tapered to 2 mg  BID prior to 2 mg daily which she started on 03/11/2018. We would recommend she go back to 4 mg BID of Dexamethasone during her hospitalization until she can be evaluated by Dr. Mickeal Skinner. I let the patient's husband know it is difficult for me to determine what to anticipate for her neurologic recovery but that hopefully Dr. Mickeal Skinner could shed some perspective on this. The patient's husband is in agreement. 3. Metastatic Adenocarcinoma of unknown primary. The patient's recent CT imaging and MRI imaging results give reason to be optimistic. We would typically plan repeat scan in 3 months for her brain surveillance, however her left hemiparesis may indicate a shorter interval scan. This will be determined by Dr. Mickeal Skinner. She will also continue to follow with Dr. Benay Spice who also plans to see her tomorrow. 4. Thrush. The patient has not responded to oral nystatin. We will follow along with the hospitalists management of this. 5. Code Status. The patient has been DNR in the past, and after discussion with she and her husband, they elect for DNR Status again.  6. Protein calorie malnutrition and deconditioning. The patient's reserve is of concern, and we anticipate recommendations from PT and nutrition upon anticipated hospitalization. 6. Social Work Needs. The patient's husband is quite dissatisfied with the care his wife has received at her current facility and requests that social work assist in placement following anticipated admission.   In a visit lasting 120 minutes, greater than 50% of the time was spent face to face discussing her case, and coordinating the patient's care.     Carola Rhine, PAC

## 2018-03-13 NOTE — ED Notes (Signed)
Repeat CBC collected and sent.

## 2018-03-13 NOTE — ED Notes (Signed)
Per primary nurse pt is drawing CBC at present.

## 2018-03-13 NOTE — H&P (Signed)
[name]:[id number] DOB: 11/17/1950 DOA: 03/13/2018     PCP: Azzie Glatter, FNP   Outpatient Specialists:     Oncology  Dr.Sherril GI* Dr. Paulita Fujita  Sadie Haber, )    Patient arrived to ER on 03/13/18 at 1537  Patient coming from: [facility]   Chief Complaint:  Chief Complaint  Patient presents with  . Fever    HPI: [name] is a 68 y.o. 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     hx of metastatic cancer known primary possibly: Colon Source Initially have a left lung mass diagnosed in October 2019 She has been having left-sided weakness ever since she came off of her Decadron this was restarted She has been having chronic leg and abdominal pain. She have had left foot ulcer with been doing better lately In November 2019   MRI showed right frontal metastasis with edema status post SRS She subsequently underwent an MRI of the brain on 02/28/2018, and while her tumor appeared a few millimeters smaller, her vasogenic edema was significant   She has had multiple issues of compliance with providers and suspicious drug seeking behavior which have led to her being fired by several practices.   She was seen by oncology today for follow-up and was found to have temperature up to 101.1 pulse 114 She has been more lethargic and been falling asleep in the office during the encounter. She was having cough and gurgling breath sounds at rest Noted to have thrush on her tongue.  She was having left sided weakness which felt to be secondary to edema from brain metastases plan to increase her dexamethasone to 4 mg twice daily and have her evaluated by Dr. Mickeal Skinner  Patient states she  just wants to go home     While in ER: RR24 HR 114 sats 92% BP 120's  influenza neg Started on broad-spectrum antibiotics No source identified at this time The following Work up has been ordered so far:  Orders Placed This Encounter  Procedures  . Blood Culture (routine x 2)  . DG Chest Port 1 View  . Lactic acid, plasma  . Comprehensive metabolic panel  . CBC WITH DIFFERENTIAL  . Urinalysis, Routine w reflex microscopic  . Influenza panel by PCR (type A & B)  . CBC with Differential/Platelet  . CBC with Differential/Platelet  . Diet NPO time specified  . Cardiac monitoring  . Refer to Sidebar Report for: Sepsis Bundle ED/IP  . Document vital signs within 1-hour of fluid bolus completion and notify provider of bolus completion  . Document Actual / Estimated Weight  . Insert peripheral IV x 2  . Initiate Carrier Fluid Protocol  . Call Code Sepsis ([facility] [phone]) Reason for Consult? tracking  . Inpatient consult to Social Work  . Consult to hospitalist  . Consult to oncology  . Droplet precaution  . Pulse oximetry, continuous  . ED EKG 12-Lead     Following Medications were ordered in ER: Medications  vancomycin (VANCOCIN) IVPB 1000 mg/200 mL premix (1,000 mg Intravenous New Bag/Given 03/13/18 1844)  sodium chloride 0.9 % bolus 1,000 mL (1,000 mLs Intravenous New Bag/Given 03/13/18 1722)    And  sodium  chloride 0.9 % bolus 250 mL (0 mLs Intravenous Stopped 03/13/18 1731)  ceFEPIme (MAXIPIME) 2 g in sodium chloride 0.9 % 100 mL IVPB (0 g Intravenous Stopped 03/13/18 1841)  metroNIDAZOLE (FLAGYL) IVPB 500 mg (0 mg Intravenous Stopped 03/13/18 1842)  fentaNYL (SUBLIMAZE) injection 25 mcg (25 mcg Intravenous Given 03/13/18 1723)  ondansetron (ZOFRAN) injection 4 mg (4 mg Intravenous Given 03/13/18 1725)    Significant initial  Findings: Abnormal Labs Reviewed  COMPREHENSIVE METABOLIC PANEL - Abnormal; Notable for the following components:      Result Value   Albumin 3.1 (*)     All other components within normal limits     Lactic Acid, Venous    Component Value Date/Time   LATICACIDVEN 1.7 03/13/2018 1632    Na 140 K 3.9  Cr   stable, Lab Results  Component Value Date   CREATININE 0.67 03/13/2018   CREATININE 0.88 02/06/2018   CREATININE 0.92 12/10/2017      WBC  9.4  HG/HCT   Down   from baseline see below    Component Value Date/Time   HGB 8.1 (L) 03/13/2018 2002   HGB 9.6 (L) 02/06/2018 1045   HCT 28.9 (L) 03/13/2018 2002   Lactic acid 1.7     Troponin (Point of Care Test) No results for input(s): TROPIPOC in the last 72 hours.   BNP (last 3 results) No results for input(s): BNP in the last 8760 hours.  ProBNP (last 3 results) No results for input(s): PROBNP in the last 8760 hours.     UA   no evidence of UTI       CXR - portable left base mass old      ECG:  Personally reviewed by me showing: HR : 102 Rhythm  Sinus tachycardia    no evidence of ischemic changes QTC 429     ED Triage Vitals  Enc Vitals Group     BP 03/13/18 1554 118/79     Pulse Rate 03/13/18 1554 99     Resp 03/13/18 1554 (!) 25     Temp 03/13/18 1554 99.3 F (37.4 C)     Temp Source 03/13/18 1554 Oral     SpO2 03/13/18 1551 92 %     Weight --      Height --      Head Circumference --      Peak Flow --      Pain Score 03/13/18 1747 7     Pain Loc --      Pain Edu? --      Excl. in [place]? --   TMAX(24)@       Latest  Blood pressure 127/78, pulse 90, temperature 99.3 F (37.4 C), temperature source Oral, resp. rate 20, SpO2 98 %.    Hospitalist was called for admission for SIRS   Review of Systems:    Pertinent positives include: Fevers, chills, fatigue,  Constitutional:  No weight loss, night sweats,  weight loss  HEENT:  No headaches, Difficulty swallowing,Tooth/dental problems,Sore throat,  No sneezing, itching, ear ache, nasal congestion, post nasal drip,  Cardio-vascular:  No chest pain, Orthopnea, PND, anasarca,  dizziness, palpitations.no Bilateral lower extremity swelling  GI:  No heartburn, indigestion, abdominal pain, nausea, vomiting, diarrhea, change in bowel habits, loss of appetite, melena, blood in stool, hematemesis Resp:  no shortness of breath at rest. No dyspnea on exertion, No excess mucus, no productive cough, No non-productive cough, No coughing up of blood.No change in color of mucus.No  wheezing. Skin:  no rash or lesions. No jaundice GU:  no dysuria, change in color of urine, no urgency or frequency. No straining to urinate.  No flank pain.  Musculoskeletal:  No joint pain or no joint swelling. No decreased range of motion. No back pain.  Psych:  No change in mood or affect. No depression or anxiety. No memory loss.  Neuro: no localizing neurological complaints, no tingling, no weakness, no double vision, no gait abnormality, no slurred speech, no confusion  All systems reviewed and apart from [place] all are negative  Past Medical History:   Past Medical History:  Diagnosis Date  . Cancer ([place])    renal ca  . COPD (chronic obstructive pulmonary disease) ([place])   . Drug-seeking behavior   . Pancreatitis   . Pancreatitis   . Seizures ([place])   . Substance abuse [facility])       Past Surgical History:  Procedure Laterality Date  . HIP SURGERY Bilateral   . IR GENERIC HISTORICAL  05/08/2015   IR RADIOLOGIST EVAL & MGMT 05/08/2015 Aletta Edouard, MD [facility] INTERV RAD  . IR GENERIC HISTORICAL  01/08/2014   IR RADIOLOGIST EVAL & MGMT 01/08/2014 Aletta Edouard, MD [facility] INTERV RAD  . IR RADIOLOGY PERIPHERAL GUIDED IV START  10/03/2017  . IR RADIOLOGY PERIPHERAL GUIDED IV START  10/10/2017  . IR US GUIDE VASC ACCESS LEFT  02/06/2018  . IR US GUIDE VASC ACCESS RIGHT  10/05/2017  . IR US GUIDE VASC ACCESS RIGHT  10/10/2017  . IR VENIPUNCTURE 47YRS/OLDER BY MD  02/06/2018  . KIDNEY SURGERY     removed cancerous lesions  . PARTIAL GASTRECTOMY      Social History:  Ambulatory     reports that  she has been smoking cigarettes. She has a 15.00 pack-year smoking history. She has never used smokeless tobacco. She reports previous alcohol use. She reports that she does not use drugs.     Family History:   Family History  Problem Relation Age of Onset  . CAD Mother   . Alcoholism Father   . COPD Father   . Lung cancer Father   . Rectal cancer Maternal Uncle     Allergies: Allergies  Allergen Reactions  . Aspirin Other (See Comments)    Break out in welts  . Chlorpromazine Hcl Other (See Comments)    Muscle spasms - "really bad"     Prior to Admission medications   Medication Sig Start Date End Date Taking? Authorizing Provider  CHLORHEXIDINE GLUCONATE, BULK, SOLN Take by mouth daily as needed. Topically to mouth daily as needed for thrush    [provider]  dexamethasone (DECADRON) 4 MG tablet Take 1 tablet (4 mg total) by mouth 2 (two) times daily. 02/07/18   Kyung Rudd, MD  diazepam (VALIUM) 5 MG tablet Take 1 tablet (5 mg total) by mouth every 6 (six) hours as needed for anxiety. 12/13/17   Charlynne Cousins, MD  famotidine (PEPCID) 20 MG tablet Take 1 tablet (20 mg total) by mouth daily. Patient taking differently: Take 20 mg by mouth at bedtime.  08/28/17   Khatri, Hina, PA-C  feeding supplement, ENSURE ENLIVE, (ENSURE ENLIVE) LIQD Take 237 mLs by mouth 3 (three) times daily between meals. 10/25/17   Eugenie Filler, MD  fluconazole (DIFLUCAN) 50 MG tablet Take 50 mg by mouth daily. 12/15/17   Ladell Pier, MD  gabapentin (NEURONTIN) 300 MG capsule Take 300 mg by mouth 4 (four) times daily.  12/05/17   [provider]  guaiFENesin (ROBITUSSIN) 100 MG/5ML liquid Take 15 mLs by mouth every 6 (six) hours as needed for cough.    [provider]  ipratropium-albuterol (DUONEB) 0.5-2.5 (3) MG/3ML SOLN Take 3 mLs by nebulization every 6 (six) hours as needed. Use 3 times daily x 4 days, then every 6 hours as needed. Patient taking  differently: Take 3 mLs by nebulization 3 (three) times daily as needed (sob and wheezing).  10/25/17   Eugenie Filler, MD  LORazepam (ATIVAN) 1 MG tablet Take 1 tablet (1 mg total) by mouth every 8 (eight) hours as needed for anxiety. 10/25/17   Eugenie Filler, MD  mirtazapine (REMERON) 45 MG tablet Take 45 mg by mouth at bedtime.  12/05/17   [provider]  ondansetron (ZOFRAN) 4 MG tablet Take 4 mg by mouth every 6 (six) hours as needed.    [provider]  oxyCODONE (OXYCONTIN) 20 mg 12 hr tablet Take 1 tablet (20 mg total) by mouth every 12 (twelve) hours. 12/13/17   Charlynne Cousins, MD  Oxycodone HCl 10 MG TABS Take 1 tablet (10 mg total) by mouth 4 (four) times daily as needed (pain). 12/13/17   Charlynne Cousins, MD  Polyethylene Glycol 3350 (MIRALAX PO) Take 17 g by mouth daily.    [provider]  PROAIR HFA 108 (90 Base) MCG/ACT inhaler Inhale 1 puff into the lungs every 4 (four) hours as needed for wheezing or shortness of breath. 11/01/17   Azzie Glatter, FNP  promethazine (PHENERGAN) 12.5 MG tablet Take 12.5 mg by mouth every 8 (eight) hours as needed for nausea or vomiting. 12/26/17   Ladell Pier, MD  tiotropium (SPIRIVA HANDIHALER) 18 MCG inhalation capsule PLACE 1 CAPSULE INTO INHALER AND INHALE DAILY 10/29/17   Eugenie Filler, MD   Physical Exam: Blood pressure 127/78, pulse 90, temperature 99.3 F (37.4 C), temperature source Oral, resp. rate 20, SpO2 98 %. 1. General:  in No  Acute distress agitated tearfull    Chronically ill cachectic  -appearing 2. Psychological: Alert and Oriented to self and situation  3. Head/ENT:     Dry Mucous Membranes                          Head Non traumatic, neck supple                          Poor Dentition 4. SKIN:  decreased Skin turgor,  Skin clean Dry and intact no rash, Left leg ulcer wraped 5. Heart: Regular rate and rhythm no  Murmur, no Rub or gallop 6. Lungs:  no wheezes  significant  crackles   7. Abdomen: Soft, non-tender,  Distended    bowel sounds present 8. Lower extremities: no clubbing, cyanosis, no  edema 9. Neurologically Grossly intact, moving all 4 extremities weaker on the left 10. MSK: Normal range of motion   LABS:     Recent Labs  Lab 03/13/18 1643  WBC QUESTIONABLE RESULTS, RECOMMEND RECOLLECT TO VERIFY  NEUTROABS SPECIMEN CLOTTED  HGB QUESTIONABLE RESULTS, RECOMMEND RECOLLECT TO VERIFY  HCT QUESTIONABLE RESULTS, RECOMMEND RECOLLECT TO VERIFY  MCV QUESTIONABLE RESULTS, RECOMMEND RECOLLECT TO VERIFY  PLT QUESTIONABLE RESULTS, RECOMMEND RECOLLECT TO VERIFY   Basic Metabolic Panel: Recent Labs  Lab 03/13/18 1643  NA 140  K 3.9  CL 101  CO2 30  GLUCOSE 95  BUN  19  CREATININE 0.67  CALCIUM 8.9      Recent Labs  Lab 03/13/18 1643  AST 15  ALT 36  ALKPHOS 88  BILITOT 0.7  PROT 6.5  ALBUMIN 3.1*   No results for input(s): LIPASE, AMYLASE in the last 168 hours. No results for input(s): AMMONIA in the last 168 hours.    HbA1C: No results for input(s): HGBA1C in the last 72 hours. CBG: No results for input(s): GLUCAP in the last 168 hours.    Urine analysis:    Component Value Date/Time   COLORURINE YELLOW 11/21/2017 0745   APPEARANCEUR CLEAR 11/21/2017 0745   LABSPEC 1.014 11/21/2017 0745   PHURINE 6.0 11/21/2017 0745   GLUCOSEU >=500 (A) 11/21/2017 0745   HGBUR MODERATE (A) 11/21/2017 0745   BILIRUBINUR NEGATIVE 11/21/2017 0745   BILIRUBINUR negative 06/03/2017 1120   KETONESUR NEGATIVE 11/21/2017 0745   PROTEINUR NEGATIVE 11/21/2017 0745   UROBILINOGEN 0.2 06/03/2017 1120   UROBILINOGEN 0.2 03/15/2017 1004   NITRITE NEGATIVE 11/21/2017 0745   LEUKOCYTESUR NEGATIVE 11/21/2017 0745      Cultures:    Component Value Date/Time   SDES  10/23/2017 1815    URINE, RANDOM Performed at [facility], [place] [address], [place], [place] [address]    SPECREQUEST  10/23/2017 1815     NONE Performed at [facility], [place] [address] [place], [place] [address]    CULT MULTIPLE SPECIES PRESENT, SUGGEST RECOLLECTION (A) 10/23/2017 1815   REPTSTATUS 10/25/2017 FINAL 10/23/2017 1815     Radiological Exams on Admission: Dg Chest Port 1 View  Result Date: 03/13/2018 CLINICAL DATA:  68 y/o F; sepsis. History of metastatic adenocarcinoma of unknown primary. EXAM: PORTABLE CHEST 1 VIEW COMPARISON:  02/06/2018 CT chest. FINDINGS: The heart size and mediastinal contours are within normal limits. Aortic calcific atherosclerosis. Reticulonodular opacities in the lung bases compatible with bronchitis/bronchiolitis similar to prior CT of chest. Left basilar consolidation appears improved. No new consolidation, effusion, or pneumothorax. No acute osseous abnormality is evident. IMPRESSION: Reticulonodular opacities in the lung bases compatible with bronchitis/bronchiolitis similar to prior CT of chest. Left basilar consolidation appears improved given differences in technique. No new consolidation. Electronically Signed   By: Kristine Garbe M.D.   On: 03/13/2018 17:14    Chart has been reviewed    Assessment/Plan   68 y.o. 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 SIRS (systemic inflammatory response syndrome) (HCC) -   -Patient meets sepsis criteria with   fever    tachycardia   Initial lactic acid Lactic Acid, Venous    Component Value Date/Time   LATICACIDVEN 1.7 03/13/2018 1632   Source most : unknown could be pulmonary but patient refuses further studies such as CT to reassess  -We will rehydrate, treat with IV antibiotics, follow lactic acid - Await results of blood and urine culture and adjust  antibiotics as needed - Obtain MRSA serologies  - Obtain respiratory panel   - no known travel hx but will need to confirm with family   . GERD -  stable continue home medications . HTN (hypertension), -patient is currently allow permissive hypertension given sepsis . Protein-calorie malnutrition, severe ([place]) -order nutritional consult . Tobacco abuse -  - Spoke about importance of quitting  spent 5 minutes discussing options for treatment, prior attempts at quitting, and dangers of smoking  -At this point patient is   NOT  interested in quitting  -refused nicotine patch   - nursing tobacco cessation protocol  . COPD (chronic obstructive pulmonary disease) (HCC) -chronic stable continue home medications . Pressure injury of skin of right ischial tuberosity region: Stage 2 -wound care consult    . Metastatic adenocarcinoma to brain with unknown primary site [facility]) Lung mass  -notify oncology and Dr. Mickeal Skinner that patient has been admitted increased dose of decadron back up to 4 mg as per oncology note.  Patient at this point stating she does not wish to have any additional studies done family not at bedside.  She appears to be alert oriented to self and situation will need further discussion with oncology regarding overall goals of care  . Chronic pain syndrome -stable continue home medications . Thrush -patient having difficulty swallowing.  Could be developing into esophageal candidiasis.  Will add IV Diflucan    . 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  Increase Decadron patient will benefit from PT OT evaluation  Other plan as per orders.  DVT prophylaxis:  SCD     Code Status:   DNR/DNI as per patient   I had personally discussed CODE STATUS with patient    Family Communication:   Family not at  Bedside    Disposition Plan:    Back to  current facility when stable                                              Would benefit from PT/OT eval prior to DC  Ordered                   Swallow eval - SLP ordered                   Social Work  consulted                   Nutrition    consulted                  Wound care  consulted                   Palliative care    consulted                   Consults called: Oncology aware  Admission status:    Obs    Level of care   tele  For  24H         Bellany Elbaum 03/13/2018, 8:57 PM    [facility]     after 2 AM please page floor coverage PA If 7AM-7PM, please contact the day team taking care of the patient using [email or web address]

## 2018-03-13 NOTE — ED Notes (Signed)
Bed: WA08 Expected date:  Expected time:  Means of arrival:  Comments: Cancer/MRI

## 2018-03-13 NOTE — ED Provider Notes (Signed)
[facility] DEPT Provider Note   CSN: [id number] Arrival date & time: 03/13/18  1537    History   Chief Complaint Chief Complaint  Patient presents with  . Fever    HPI [name] is a 68 y.o. female.     Patient's known cancer patient.  Had appointment today for an MRI evaluation which is showing frontal lobe brain met.  Patient by history has a history of some bone mets.  Patient had past history of renal cell carcinoma 2009 12/06/2010.  But in October 2019 left lung mass was identified.  Patient is followed by hematology oncology.  Patient is from nursing facility.  Patient is undergoing brain radiation.  Sent in for cough congestion and some expiratory wheezing the nursing facility is [facility].  No recent travel.     Past Medical History:  Diagnosis Date  . Cancer ([place])    renal ca  . COPD (chronic obstructive pulmonary disease) ([place])   . Drug-seeking behavior   . Pancreatitis   . Pancreatitis   . Seizures ([place])   . Substance abuse [facility])     Patient Active Problem List   Diagnosis Date Noted  . SIRS (systemic inflammatory response syndrome) ([place]) 03/13/2018  . Thrush 03/13/2018  . Dehydration 03/13/2018  . DNR (do not resuscitate)   . Palliative care by specialist   . Chronic pain syndrome   . Cellulitis of left foot 12/10/2017  . Weakness 12/10/2017  . Cellulitis 12/09/2017  . Right hemiplegia ([place]) 11/21/2017  . 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 Lung mass 10/04/2017  . Osteoarthritis of facet joint of lumbar spine 07/26/2017  . Low back pain radiating to both legs 07/26/2017  . 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 Adult failure to thrive 06/21/2017  . Pressure injury of skin 04/06/2017  . Pressure injury of skin of right ischial tuberosity region: Stage 2 04/06/2017  . Benzodiazepine dependence ([place])   . Narcotic addiction ([place])  02/29/2016  . Abdominal pain, chronic, epigastric- due to chronic pancreatitis 02/29/2016  . COPD (chronic obstructive pulmonary disease) ([place]) 02/28/2016  . Cough 09/03/2015  . Abdominal pain 01/03/2015  . Hyponatremia 01/03/2015  . Hypocalcemia 01/03/2015  . Underweight 01/03/2015  . COPD GOLD III with reversible component  07/17/2014  . Chronic alcoholic pancreatitis ([place]) 06/02/2013  . Recurrent acute pancreatitis 03/28/2013  . Alcohol withdrawal ([place]) 03/28/2013  . Pancreatitis 03/09/2013  . Tobacco abuse 03/09/2013  . Alcohol withdrawal syndrome with perceptual disturbance ([place]) 03/09/2013  . Benzodiazepine withdrawal ([place]) 02/09/2013  . Protein-calorie malnutrition, severe ([place]) 02/09/2013  . Hypokalemia 02/27/2011  . Abdominal pain, acute 02/25/2011  . Nausea and vomiting 02/25/2011  . Thrombocytopenia ([place]) 02/25/2011  . HTN (hypertension), malignant 02/25/2011  . Malignant neoplasm of kidney excluding renal pelvis ([place]) 10/18/2008  . Anxiety state 10/18/2008  . GERD 10/18/2008  . PANCREATITIS, CHRONIC- atrophic pancreas 10/18/2008  . DYSPNEA 10/18/2008  . GASTRIC ULCER, HX OF 10/18/2008    Past Surgical History:  Procedure Laterality Date  . HIP SURGERY Bilateral   . IR GENERIC HISTORICAL  05/08/2015   IR RADIOLOGIST EVAL & MGMT 05/08/2015 Aletta Edouard, MD [facility] INTERV RAD  . IR GENERIC HISTORICAL  01/08/2014   IR RADIOLOGIST EVAL & MGMT 01/08/2014 Aletta Edouard, MD [facility] INTERV RAD  . IR RADIOLOGY PERIPHERAL GUIDED IV START  10/03/2017  . IR RADIOLOGY PERIPHERAL GUIDED IV START  10/10/2017  . IR US GUIDE  VASC ACCESS LEFT  02/06/2018  . IR US GUIDE VASC ACCESS RIGHT  10/05/2017  . IR US GUIDE VASC ACCESS RIGHT  10/10/2017  . IR VENIPUNCTURE 38YRS/OLDER BY MD  02/06/2018  . KIDNEY SURGERY     removed cancerous lesions  . PARTIAL GASTRECTOMY       OB History   No obstetric history on file.      Home Medications    Prior to Admission medications   Medication Sig  Start Date End Date Taking? Authorizing Provider  CHLORHEXIDINE GLUCONATE, BULK, SOLN Take by mouth daily as needed. Topically to mouth daily as needed for thrush   Yes [provider]  dexamethasone (DECADRON) 2 MG tablet Take 2 mg by mouth See admin instructions. Every other day for 2 weeks, then once daily for two weeks 03/12/18  Yes [provider]  diazepam (VALIUM) 5 MG tablet Take 1 tablet (5 mg total) by mouth every 6 (six) hours as needed for anxiety. 12/13/17  Yes Charlynne Cousins, MD  famotidine (PEPCID) 20 MG tablet Take 1 tablet (20 mg total) by mouth daily. Patient taking differently: Take 20 mg by mouth at bedtime.  08/28/17  Yes Khatri, Hina, PA-C  gabapentin (NEURONTIN) 300 MG capsule Take 300 mg by mouth 4 (four) times daily.  12/05/17  Yes [provider]  guaiFENesin (ROBITUSSIN) 100 MG/5ML liquid Take 15 mLs by mouth every 6 (six) hours as needed for cough.   Yes [provider]  ipratropium-albuterol (DUONEB) 0.5-2.5 (3) MG/3ML SOLN Take 3 mLs by nebulization every 6 (six) hours as needed. Use 3 times daily x 4 days, then every 6 hours as needed. Patient taking differently: Take 3 mLs by nebulization 3 (three) times daily as needed (sob and wheezing).  10/25/17  Yes Eugenie Filler, MD  mirtazapine (REMERON) 45 MG tablet Take 45 mg by mouth at bedtime.  12/05/17  Yes [provider]  neomycin-bacitracin-polymyxin (NEOSPORIN) ointment Apply 1 application topically daily. To left arm for skin tear   Yes [provider]  ondansetron (ZOFRAN) 4 MG tablet Take 4 mg by mouth every 6 (six) hours as needed for nausea.    Yes [provider]  oxyCODONE (OXYCONTIN) 20 mg 12 hr tablet Take 1 tablet (20 mg total) by mouth every 12 (twelve) hours. 12/13/17  Yes Charlynne Cousins, MD  Oxycodone HCl 10 MG TABS Take 1 tablet (10 mg total) by mouth 4 (four) times daily as needed (pain). 12/13/17  Yes Charlynne Cousins, MD    Polyethylene Glycol 3350 (MIRALAX PO) Take 17 g by mouth daily.   Yes [provider]  PROAIR HFA 108 (90 Base) MCG/ACT inhaler Inhale 1 puff into the lungs every 4 (four) hours as needed for wheezing or shortness of breath. 11/01/17  Yes Azzie Glatter, FNP  promethazine (PHENERGAN) 12.5 MG tablet Take 12.5 mg by mouth every 8 (eight) hours as needed for nausea or vomiting. 12/26/17  Yes Ladell Pier, MD  tiotropium (SPIRIVA HANDIHALER) 18 MCG inhalation capsule PLACE 1 CAPSULE INTO INHALER AND INHALE DAILY 10/29/17  Yes Eugenie Filler, MD  feeding supplement, ENSURE ENLIVE, (ENSURE ENLIVE) LIQD Take 237 mLs by mouth 3 (three) times daily between meals. Patient not taking: Reported on 03/13/2018 10/25/17   Eugenie Filler, MD  LORazepam (ATIVAN) 1 MG tablet Take 1 tablet (1 mg total) by mouth every 8 (eight) hours as needed for anxiety. Patient not taking: Reported on 03/13/2018 10/25/17  Eugenie Filler, MD    Family History Family History  Problem Relation Age of Onset  . CAD Mother   . Alcoholism Father   . COPD Father   . Lung cancer Father   . Rectal cancer Maternal Uncle     Social History Social History   Tobacco Use  . Smoking status: Current Every Day Smoker    Packs/day: 0.50    Years: 30.00    Pack years: 15.00    Types: Cigarettes  . Smokeless tobacco: Never Used  . Tobacco comment: smoking up to 1.5 ppd per husband. down from 3pk day to 1.5   Substance Use Topics  . Alcohol use: Not Currently    Alcohol/week: 0.0 standard drinks    Comment: "I stopped drinking 1 year ago"  . Drug use: No    Comment: Hx of polysubstance drug abuse     Allergies   Aspirin and Chlorpromazine hcl   Review of Systems Review of Systems  Constitutional: Positive for fever. Negative for chills.  HENT: Positive for congestion. Negative for rhinorrhea and sore throat.   Eyes: Negative for visual disturbance.  Respiratory: Positive for cough. Negative for  shortness of breath.   Cardiovascular: Negative for chest pain and leg swelling.  Gastrointestinal: Negative for abdominal pain, diarrhea, nausea and vomiting.  Genitourinary: Negative for dysuria.  Musculoskeletal: Positive for myalgias. Negative for back pain and neck pain.  Skin: Negative for rash.  Neurological: Negative for dizziness, light-headedness and headaches.  Hematological: Does not bruise/bleed easily.  Psychiatric/Behavioral: Negative for confusion.     Physical Exam Updated Vital Signs BP (!) 142/86   Pulse 87   Temp 98.5 F (36.9 C) (Oral)   Resp 18   SpO2 100%   Physical Exam Vitals signs and nursing note reviewed.  Constitutional:      General: She is not in acute distress.    Appearance: She is well-developed.  HENT:     Head: Normocephalic and atraumatic.  Eyes:     Extraocular Movements: Extraocular movements intact.     Conjunctiva/sclera: Conjunctivae normal.     Pupils: Pupils are equal, round, and reactive to light.  Neck:     Musculoskeletal: Normal range of motion and neck supple.  Cardiovascular:     Rate and Rhythm: Regular rhythm. Tachycardia present.     Heart sounds: No murmur.  Pulmonary:     Effort: Respiratory distress present.     Breath sounds: Normal breath sounds.  Abdominal:     Palpations: Abdomen is soft.     Tenderness: There is no abdominal tenderness.  Musculoskeletal: Normal range of motion.  Skin:    General: Skin is warm and dry.  Neurological:     Mental Status: She is alert. Mental status is at baseline.     Comments: Moving all 4 extremities.  Patient with some wounds chronic.      ED Treatments / Results  Labs (all labs ordered are listed, but only abnormal results are displayed) Labs Reviewed  COMPREHENSIVE METABOLIC PANEL - Abnormal; Notable for the following components:      Result Value   Albumin 3.1 (*)    All other components within normal limits  URINALYSIS, ROUTINE W REFLEX MICROSCOPIC -  Abnormal; Notable for the following components:   Hgb urine dipstick MODERATE (*)    Bacteria, UA RARE (*)    All other components within normal limits  CBC WITH DIFFERENTIAL/PLATELET - Abnormal; Notable for the following components:   RBC  3.05 (*)    Hemoglobin 8.1 (*)    HCT 28.9 (*)    MCHC 28.0 (*)    RDW 18.3 (*)    Neutro Abs 8.2 (*)    Lymphs Abs 0.5 (*)    Abs Immature Granulocytes 0.12 (*)    All other components within normal limits  RAPID URINE DRUG SCREEN, HOSP PERFORMED - Abnormal; Notable for the following components:   Opiates POSITIVE (*)    Benzodiazepines POSITIVE (*)    All other components within normal limits  BLOOD GAS, VENOUS - Abnormal; Notable for the following components:   Bicarbonate 29.6 (*)    Acid-Base Excess 3.5 (*)    All other components within normal limits  CULTURE, BLOOD (ROUTINE X 2)  CULTURE, BLOOD (ROUTINE X 2)  LACTIC ACID, PLASMA  CBC WITH DIFFERENTIAL/PLATELET  INFLUENZA PANEL BY PCR (TYPE A & B)  CBC WITH DIFFERENTIAL/PLATELET  LACTIC ACID, PLASMA  CBC WITH DIFFERENTIAL/PLATELET  MAGNESIUM  PHOSPHORUS  PREALBUMIN    EKG EKG Interpretation  Date/Time:  Monday March 13 2018 17:10:25 EDT Ventricular Rate:  102 PR Interval:    QRS Duration: 72 QT Interval:  329 QTC Calculation: 429 R Axis:   83 Text Interpretation:  Sinus tachycardia Multiple ventricular premature complexes Aberrant complex Biatrial enlargement Borderline right axis deviation No significant change since last tracing Confirmed by Fredia Sorrow [phone]) on 03/13/2018 5:32:56 PM   Radiology Dg Chest Port 1 View  Result Date: 03/13/2018 CLINICAL DATA:  68 y/o F; sepsis. History of metastatic adenocarcinoma of unknown primary. EXAM: PORTABLE CHEST 1 VIEW COMPARISON:  02/06/2018 CT chest. FINDINGS: The heart size and mediastinal contours are within normal limits. Aortic calcific atherosclerosis. Reticulonodular opacities in the lung bases compatible with  bronchitis/bronchiolitis similar to prior CT of chest. Left basilar consolidation appears improved. No new consolidation, effusion, or pneumothorax. No acute osseous abnormality is evident. IMPRESSION: Reticulonodular opacities in the lung bases compatible with bronchitis/bronchiolitis similar to prior CT of chest. Left basilar consolidation appears improved given differences in technique. No new consolidation. Electronically Signed   By: Kristine Garbe M.D.   On: 03/13/2018 17:14    Procedures Procedures (including critical care time)  CRITICAL CARE Performed by: Fredia Sorrow Total critical care time: 30  minutes Critical care time was exclusive of separately billable procedures and treating other patients. Critical care was necessary to treat or prevent imminent or life-threatening deterioration. Critical care was time spent personally by me on the following activities: development of treatment plan with patient and/or surrogate as well as nursing, discussions with consultants, evaluation of patient's response to treatment, examination of patient, obtaining history from patient or surrogate, ordering and performing treatments and interventions, ordering and review of laboratory studies, ordering and review of radiographic studies, pulse oximetry and re-evaluation of patient's condition.  Patient upon presentation had vital signs concerning for possible sepsis.  So sepsis criteria was initiated.  Medications Ordered in ED Medications  fluconazole (DIFLUCAN) IVPB 200 mg (has no administration in time range)  nystatin (MYCOSTATIN) 100000 UNIT/ML suspension 500,000 Units (has no administration in time range)  sodium chloride 0.9 % bolus 1,000 mL (0 mLs Intravenous Stopped 03/13/18 2010)    And  sodium chloride 0.9 % bolus 250 mL (0 mLs Intravenous Stopped 03/13/18 1731)  ceFEPIme (MAXIPIME) 2 g in sodium chloride 0.9 % 100 mL IVPB (0 g Intravenous Stopped 03/13/18 1841)  metroNIDAZOLE  (FLAGYL) IVPB 500 mg (0 mg Intravenous Stopped 03/13/18 1842)  vancomycin (VANCOCIN) IVPB 1000 mg/200  mL premix (0 mg Intravenous Stopped 03/13/18 2010)  fentaNYL (SUBLIMAZE) injection 25 mcg (25 mcg Intravenous Given 03/13/18 1723)  ondansetron (ZOFRAN) injection 4 mg (4 mg Intravenous Given 03/13/18 1725)     Initial Impression / Assessment and Plan / ED Course  I have reviewed the triage vital signs and the nursing notes.  Pertinent labs & imaging results that were available during my care of the patient were reviewed by me and considered in my medical decision making (see chart for details).        Patient's vital signs with the fever increased respiratory rate tachycardia met criteria for sepsis.  Sepsis protocol was initiated.  Broad-spectrum antibiotics as well as fluid protocol.  Patient is saturations were around 92%.  So she was started on 2 L of nasal cannula oxygen which brought her sats up.  Patient has a known history of metastatic cancer.  In the past was known to have renal cell carcinoma and then in October 2019 had a left lung mass.  And now has a frontal lobe brain met.  Also has some bony mets.  Patient's prior oncologist is Dr. Malachy Mood.  On-call oncology was contacted they will follow her.  Hospitalist contacted for admission.  Patient's influenza testing was negative.  There was no leukocytosis.  Hemoglobin was 8.1.  Electrolytes including renal function without any significant abnormalities.  Initial lactic acid was 1.7.  Patient symptoms seem to be consistent with an upper respiratory infection.  So thought maybe it was the flu but is been negative.  Patient is from a nursing home has not had any unusual travel.  Hospitalist will admit.    Final Clinical Impressions(s) / ED Diagnoses   Final diagnoses:  Sepsis, due to unspecified organism, unspecified whether acute organ dysfunction present [facility])  Upper respiratory tract infection, unspecified type    ED Discharge  Orders    None       Fredia Sorrow, MD 03/13/18 2138

## 2018-03-13 NOTE — Telephone Encounter (Signed)
Called patient to ask about changing today's appt. to 2:30 pm on 03-13-18, lvm for a return  call

## 2018-03-13 NOTE — ED Notes (Signed)
2x attempts to start IV failed. Requesting ultrasound IV.

## 2018-03-14 ENCOUNTER — Other Ambulatory Visit: Payer: Self-pay

## 2018-03-14 ENCOUNTER — Other Ambulatory Visit: Payer: Self-pay | Admitting: *Deleted

## 2018-03-14 ENCOUNTER — Telehealth: Payer: Self-pay | Admitting: Oncology

## 2018-03-14 DIAGNOSIS — K219 Gastro-esophageal reflux disease without esophagitis: Secondary | ICD-10-CM | POA: Diagnosis not present

## 2018-03-14 DIAGNOSIS — J069 Acute upper respiratory infection, unspecified: Secondary | ICD-10-CM | POA: Diagnosis present

## 2018-03-14 DIAGNOSIS — Z85528 Personal history of other malignant neoplasm of kidney: Secondary | ICD-10-CM

## 2018-03-14 DIAGNOSIS — F17210 Nicotine dependence, cigarettes, uncomplicated: Secondary | ICD-10-CM | POA: Diagnosis present

## 2018-03-14 DIAGNOSIS — R1312 Dysphagia, oropharyngeal phase: Secondary | ICD-10-CM | POA: Diagnosis present

## 2018-03-14 DIAGNOSIS — D6489 Other specified anemias: Secondary | ICD-10-CM

## 2018-03-14 DIAGNOSIS — I83009 Varicose veins of unspecified lower extremity with ulcer of unspecified site: Secondary | ICD-10-CM | POA: Diagnosis present

## 2018-03-14 DIAGNOSIS — J441 Chronic obstructive pulmonary disease with (acute) exacerbation: Secondary | ICD-10-CM | POA: Diagnosis present

## 2018-03-14 DIAGNOSIS — B370 Candidal stomatitis: Secondary | ICD-10-CM | POA: Diagnosis present

## 2018-03-14 DIAGNOSIS — Z66 Do not resuscitate: Secondary | ICD-10-CM | POA: Diagnosis present

## 2018-03-14 DIAGNOSIS — Z681 Body mass index (BMI) 19 or less, adult: Secondary | ICD-10-CM | POA: Diagnosis not present

## 2018-03-14 DIAGNOSIS — L97529 Non-pressure chronic ulcer of other part of left foot with unspecified severity: Secondary | ICD-10-CM | POA: Diagnosis present

## 2018-03-14 DIAGNOSIS — J181 Lobar pneumonia, unspecified organism: Secondary | ICD-10-CM | POA: Diagnosis present

## 2018-03-14 DIAGNOSIS — E860 Dehydration: Secondary | ICD-10-CM | POA: Diagnosis present

## 2018-03-14 DIAGNOSIS — A419 Sepsis, unspecified organism: Secondary | ICD-10-CM | POA: Diagnosis present

## 2018-03-14 DIAGNOSIS — L89892 Pressure ulcer of other site, stage 2: Secondary | ICD-10-CM | POA: Diagnosis present

## 2018-03-14 DIAGNOSIS — F419 Anxiety disorder, unspecified: Secondary | ICD-10-CM | POA: Diagnosis present

## 2018-03-14 DIAGNOSIS — C7931 Secondary malignant neoplasm of brain: Secondary | ICD-10-CM | POA: Diagnosis present

## 2018-03-14 DIAGNOSIS — C3492 Malignant neoplasm of unspecified part of left bronchus or lung: Secondary | ICD-10-CM

## 2018-03-14 DIAGNOSIS — D638 Anemia in other chronic diseases classified elsewhere: Secondary | ICD-10-CM | POA: Diagnosis present

## 2018-03-14 DIAGNOSIS — K859 Acute pancreatitis without necrosis or infection, unspecified: Secondary | ICD-10-CM

## 2018-03-14 DIAGNOSIS — J449 Chronic obstructive pulmonary disease, unspecified: Secondary | ICD-10-CM | POA: Diagnosis not present

## 2018-03-14 DIAGNOSIS — B3781 Candidal esophagitis: Secondary | ICD-10-CM | POA: Diagnosis present

## 2018-03-14 DIAGNOSIS — J440 Chronic obstructive pulmonary disease with acute lower respiratory infection: Secondary | ICD-10-CM | POA: Diagnosis present

## 2018-03-14 DIAGNOSIS — Z515 Encounter for palliative care: Secondary | ICD-10-CM | POA: Diagnosis present

## 2018-03-14 DIAGNOSIS — C7951 Secondary malignant neoplasm of bone: Secondary | ICD-10-CM | POA: Diagnosis present

## 2018-03-14 DIAGNOSIS — I10 Essential (primary) hypertension: Secondary | ICD-10-CM | POA: Diagnosis present

## 2018-03-14 DIAGNOSIS — R569 Unspecified convulsions: Secondary | ICD-10-CM | POA: Diagnosis present

## 2018-03-14 DIAGNOSIS — C642 Malignant neoplasm of left kidney, except renal pelvis: Secondary | ICD-10-CM

## 2018-03-14 DIAGNOSIS — E43 Unspecified severe protein-calorie malnutrition: Secondary | ICD-10-CM | POA: Diagnosis present

## 2018-03-14 DIAGNOSIS — G894 Chronic pain syndrome: Secondary | ICD-10-CM | POA: Diagnosis present

## 2018-03-14 LAB — RESPIRATORY PANEL BY PCR
Adenovirus: NOT DETECTED
BORDETELLA PERTUSSIS-RVPCR: NOT DETECTED
Chlamydophila pneumoniae: NOT DETECTED
Coronavirus 229E: NOT DETECTED
Coronavirus HKU1: NOT DETECTED
Coronavirus NL63: NOT DETECTED
Coronavirus OC43: NOT DETECTED
Influenza A: NOT DETECTED
Influenza B: NOT DETECTED
METAPNEUMOVIRUS-RVPPCR: NOT DETECTED
Mycoplasma pneumoniae: NOT DETECTED
PARAINFLUENZA VIRUS 2-RVPPCR: NOT DETECTED
Parainfluenza Virus 1: NOT DETECTED
Parainfluenza Virus 3: NOT DETECTED
Parainfluenza Virus 4: NOT DETECTED
Respiratory Syncytial Virus: NOT DETECTED
Rhinovirus / Enterovirus: NOT DETECTED

## 2018-03-14 LAB — MRSA PCR SCREENING: MRSA by PCR: POSITIVE — AB

## 2018-03-14 MED ORDER — SODIUM CHLORIDE 0.9 % IV BOLUS
500.0000 mL | Freq: Once | INTRAVENOUS | Status: AC
  Administered 2018-03-14: 500 mL via INTRAVENOUS

## 2018-03-14 MED ORDER — MORPHINE SULFATE (PF) 2 MG/ML IV SOLN
2.0000 mg | INTRAVENOUS | Status: AC | PRN
  Administered 2018-03-15 – 2018-03-16 (×2): 2 mg via INTRAVENOUS
  Filled 2018-03-14 (×2): qty 1

## 2018-03-14 MED ORDER — SODIUM CHLORIDE 0.9 % IV SOLN
2.0000 g | Freq: Two times a day (BID) | INTRAVENOUS | Status: DC
  Administered 2018-03-14 – 2018-03-16 (×5): 2 g via INTRAVENOUS
  Filled 2018-03-14 (×6): qty 2

## 2018-03-14 MED ORDER — RESOURCE THICKENUP CLEAR PO POWD
ORAL | Status: DC | PRN
  Filled 2018-03-14: qty 125

## 2018-03-14 MED ORDER — DEXAMETHASONE SODIUM PHOSPHATE 4 MG/ML IJ SOLN
4.0000 mg | Freq: Four times a day (QID) | INTRAMUSCULAR | Status: DC
  Administered 2018-03-15 (×2): 4 mg via INTRAVENOUS
  Filled 2018-03-14 (×2): qty 1

## 2018-03-14 MED ORDER — DEXAMETHASONE SODIUM PHOSPHATE 10 MG/ML IJ SOLN
4.0000 mg | Freq: Four times a day (QID) | INTRAMUSCULAR | Status: DC
  Administered 2018-03-14 (×2): 4 mg via INTRAVENOUS
  Filled 2018-03-14 (×5): qty 0.4

## 2018-03-14 MED ORDER — MUPIROCIN 2 % EX OINT
1.0000 "application " | TOPICAL_OINTMENT | Freq: Two times a day (BID) | CUTANEOUS | Status: DC
  Administered 2018-03-15 – 2018-03-16 (×3): 1 via NASAL
  Filled 2018-03-14: qty 22

## 2018-03-14 MED ORDER — SODIUM CHLORIDE 0.9 % IV SOLN
INTRAVENOUS | Status: AC
  Administered 2018-03-14: 17:00:00 via INTRAVENOUS

## 2018-03-14 MED ORDER — CHLORHEXIDINE GLUCONATE CLOTH 2 % EX PADS
6.0000 | MEDICATED_PAD | Freq: Every day | CUTANEOUS | Status: DC
  Administered 2018-03-14: 6 via TOPICAL

## 2018-03-14 MED ORDER — BOOST / RESOURCE BREEZE PO LIQD CUSTOM
1.0000 | Freq: Two times a day (BID) | ORAL | Status: DC
  Administered 2018-03-16: 1 via ORAL

## 2018-03-14 MED ORDER — VANCOMYCIN HCL IN DEXTROSE 1-5 GM/200ML-% IV SOLN
1000.0000 mg | INTRAVENOUS | Status: DC
  Administered 2018-03-15: 1000 mg via INTRAVENOUS
  Filled 2018-03-14: qty 200

## 2018-03-14 NOTE — Evaluation (Signed)
Physical Therapy Evaluation-1x Patient Details Name: [name] MRN: [id number] DOB: 26-Jun-1950 Today's Date: 03/14/2018   History of Present Illness  68 yo female admitted with SIRS. Hx of met adenocarcinoma of unknown origin, lung ca, bony and brain mets, L side hemiplegia (LUE worse than L LE), COPD, substance abuse, Sz. Pt is from a SNF.   Clinical Impression  Bed level eval only. This was very limited due to pt's unwillingness to participate with PT. Pt refused to mobilize despite encouragement from therapist. No family present during session. Recommend return to SNF. 1x eval. Will sign off.     Follow Up Recommendations (return to SNF)    Equipment Recommendations  None recommended by PT    Recommendations for Other Services       Precautions / Restrictions Precautions Precautions: Fall Restrictions Weight Bearing Restrictions: No      Mobility  Bed Mobility               General bed mobility comments: NT-pt refused. "No..Marland KitchenIm not doing it"  Transfers                    Ambulation/Gait                Stairs            Wheelchair Mobility    Modified Rankin (Stroke Patients Only)       Balance                                             Pertinent Vitals/Pain Pain Assessment: Faces Faces Pain Scale: Hurts even more Pain Location: "all over" generalized Pain Intervention(s): Limited activity within patient's tolerance    Home Living Family/patient expects to be discharged to:: Skilled nursing facility                      Prior Function Level of Independence: Needs assistance   Gait / Transfers Assistance Needed: not sure if pt is a reliable historian. She stated she did very little at SNF           Hand Dominance        Extremity/Trunk Assessment   Upper Extremity Assessment Upper Extremity Assessment: LUE deficits/detail;RUE deficits/detail;Difficult to assess due to impaired  cognition RUE Deficits / Details: generalized weakness LUE Deficits / Details: flaccid    Lower Extremity Assessment Lower Extremity Assessment: RLE deficits/detail;LLE deficits/detail;Difficult to assess due to impaired cognition RLE Deficits / Details: generalized weakness.  LLE Deficits / Details: unable to accurately assess due to lack of pt participation       Communication   Communication: HOH  Cognition Arousal/Alertness: Awake/alert Behavior During Therapy: Restless Overall Cognitive Status: No family/caregiver present to determine baseline cognitive functioning Area of Impairment: Attention;Problem solving;Following commands                   Current Attention Level: Focused   Following Commands: Follows one step commands inconsistently     Problem Solving: Decreased initiation;Requires verbal cues;Requires tactile cues        General Comments      Exercises     Assessment/Plan    PT Assessment Patent does not need any further PT services  PT Problem List         PT Treatment Interventions      PT Goals (  Current goals can be found in the Care Plan section)  Acute Rehab PT Goals Patient Stated Goal: none stated PT Goal Formulation: All assessment and education complete, DC therapy    Frequency     Barriers to discharge        Co-evaluation               AM-PAC PT "6 Clicks" Mobility  Outcome Measure Help needed turning from your back to your side while in a flat bed without using bedrails?: Total Help needed moving from lying on your back to sitting on the side of a flat bed without using bedrails?: Total Help needed moving to and from a bed to a chair (including a wheelchair)?: Total Help needed standing up from a chair using your arms (e.g., wheelchair or bedside chair)?: Total Help needed to walk in hospital room?: Total Help needed climbing 3-5 steps with a railing? : Total 6 Click Score: 6    End of Session   Activity  Tolerance: (limited by pt unwilling to participate) Patient left: in bed;with call bell/phone within reach;with bed alarm set        Time: 0921-0929 PT Time Calculation (min) (ACUTE ONLY): 8 min   Charges:   PT Evaluation $PT Eval Moderate Complexity: [place], PT Acute Rehabilitation Services Pager: [phone] Office: [phone]

## 2018-03-14 NOTE — Progress Notes (Signed)
OT Cancellation Note  Patient Details Name: RYLEI MASELLA MRN: [id number] DOB: [birth year]   Cancelled Treatment:    Reason Eval/Treat Not Completed: Other (comment)  Noted pt from a SNF and is returning to SNF. Will defer OT eval to SNF  Kari Baars, [place] [phone] Office- [phone], Edwena Felty D 03/14/2018, 12:04 PM

## 2018-03-14 NOTE — Progress Notes (Signed)
Initial Nutrition Assessment  DOCUMENTATION CODES:   Underweight, Severe malnutrition in context of chronic illness  INTERVENTION:    Boost Breeze po BID, each supplement provides 250 kcal and 9 grams of protein  Magic cup TID with meals, each supplement provides 290 kcal and 9 grams of protein  NUTRITION DIAGNOSIS:   Severe Malnutrition related to chronic illness, cancer and cancer related treatments as evidenced by severe fat depletion, severe muscle depletion.  GOAL:   Patient will meet greater than or equal to 90% of their needs  MONITOR:   PO intake, Weight trends, Supplement acceptance, Labs  REASON FOR ASSESSMENT:   Consult Assessment of nutrition requirement/status  ASSESSMENT:   Patient with PMH significant for metastatic cancer to brain and lung (mass found in 10/2017), ETOH abuse, tobacco abuse, COPD, pancreatitis, remote left renal cell carcinoma 2009, partial gastrectomy, and anemia. Presents this admission with SIRS from unknown source. Per H&P, pt refuses other studies.    Suspect pt confused upon assessment. She denies having a loss in appetite PTA. States she eats one yogurt parfait daily. No meal completions charted this admission. She does not wish to Ensure but is willing to try Magic Cup (will attempt Boost Breeze as well). From prior admissions, pt known to have a chronic poor appetite likely multifactorial from breathing difficulty and cancer progression. Palliative care consulted to determine [place].   Pt endorses a UBW of 100 lb and denies any recent wt loss. Pt did not have admission wt taken. Will need to obtain current weight to determine if pt had further weight loss. Nutrition-Focused physical exam completed.   Medications reviewed and include: decadron, colace, remeron, thiamine Labs reviewed.   NUTRITION - FOCUSED PHYSICAL EXAM:    Most Recent Value  Orbital Region  Moderate depletion  Upper Arm Region  Severe depletion  Thoracic and Lumbar  Region  Severe depletion  Buccal Region  Unable to assess [lower face swollen]  Temple Region  Severe depletion  Clavicle Bone Region  Severe depletion  Clavicle and Acromion Bone Region  Severe depletion  Scapular Bone Region  Severe depletion  Dorsal Hand  Severe depletion  Patellar Region  Severe depletion  Anterior Thigh Region  Severe depletion  Posterior Calf Region  Severe depletion  Edema (RD Assessment)  None  Hair  Reviewed  Eyes  Reviewed  Mouth  Reviewed  Skin  Reviewed  Nails  Reviewed     Diet Order:   Diet Order            DIET SOFT Room service appropriate? Yes; Fluid consistency: Thin  Diet effective now              EDUCATION NEEDS:   Not appropriate for education at this time  Skin:  Skin Assessment: Skin Integrity Issues: Skin Integrity Issues:: Other (Comment), Stage II Stage II: sacrum Other: skin tear L elbow, L knee  Last BM:  PTA  Height:   Ht Readings from Last 1 Encounters:  03/13/18 5\' 5"  (1.651 m)    Weight:   Wt Readings from Last 1 Encounters:  03/13/18 42.2 kg    Ideal Body Weight:  56.8 kg  BMI:  Body mass index is 15.48 kg/m.  Estimated Nutritional Needs:   Kcal:  1300-1500 kcal  Protein:  65-80 grams  Fluid:  >/= 1.3 L/day    Mariana Single RD, LDN Clinical Nutrition Pager # - [phone]

## 2018-03-14 NOTE — Progress Notes (Signed)
IP PROGRESS NOTE  Subjective:   [name] is known to me with a history of metastatic non-small cell carcinoma.  She was seen in radiation oncology yesterday.  She was referred to the emergency room secondary to concern for pneumonia. Mr. Guaman reports she developed increased cough and wheezing beginning 03/11/2018.  She was noted to have a fever yesterday.  She was admitted from the emergency room and placed on IV antibiotics.  She has no complaint this morning.  Her husband is at the bedside.  He reports the left-sided weakness worsened when Decadron was tapered to 2 mg last month.  Objective: Vital signs in last 24 hours: Blood pressure (!) 153/113, pulse (!) 110, temperature 99.5 F (37.5 C), temperature source Oral, resp. rate 16, height _0  (1.651 m), weight 93 lb (42.2 kg), SpO2 94 %.  Intake/Output from previous day: 03/09 0701 - 03/10 0700 In: 2290.8 [I.V.:450; IV Piggyback:1840.8] Out: 300 [Urine:300]  Physical Exam:  HEENT: Dryness of the tongue, no thrush Lungs: Mild inspiratory rhonchi at the right lower posterior chest, otherwise clear, no respiratory distress Cardiac: Regular rate and rhythm Abdomen: Nontender, no hepato-splenomegaly Extremities: Pitting edema at the right greater than left foot Skin: Multiple ecchymoses, dressings over areas of skin tears   Lab Results: Recent Labs    03/13/18 1851 03/13/18 2002  WBC SPECIMEN CLOTTED 9.4  HGB SPECIMEN CLOTTED 8.1*  HCT SPECIMEN CLOTTED 28.9*  PLT SPECIMEN CLOTTED 202    BMET Recent Labs    03/13/18 1643  NA 140  K 3.9  CL 101  CO2 30  GLUCOSE 95  BUN 19  CREATININE 0.67  CALCIUM 8.9    Lab Results  Component Value Date   CEA1 7.08 (H) 10/19/2017    Studies/Results: Abd 1 View (kub)  Result Date: 03/13/2018 CLINICAL DATA:  Abdominal distension. EXAM: ABDOMEN - 1 VIEW COMPARISON:  CT abdomen and pelvis February 06, 2018 FINDINGS: Bowel gas pattern is nondilated and nonobstructive. Advanced  vascular calcifications. Surgical staples LEFT upper quadrant. Surgical clips project RIGHT pelvis. No intra-abdominal mass effect, pathologic calcifications or free air. Rounded calcification LEFT mid abdomen corresponding to renal lesion on prior CT. Soft tissue planes and included osseous structures are nonsuspicious. LEFT femur pinning. IMPRESSION: Nonspecific bowel gas pattern. Aortic Atherosclerosis (ICD10-I70.0). Electronically Signed   By: Elon Alas M.D.   On: 03/13/2018 23:25   Dg Chest Port 1 View  Result Date: 03/13/2018 CLINICAL DATA:  68 y/o F; sepsis. History of metastatic adenocarcinoma of unknown primary. EXAM: PORTABLE CHEST 1 VIEW COMPARISON:  02/06/2018 CT chest. FINDINGS: The heart size and mediastinal contours are within normal limits. Aortic calcific atherosclerosis. Reticulonodular opacities in the lung bases compatible with bronchitis/bronchiolitis similar to prior CT of chest. Left basilar consolidation appears improved. No new consolidation, effusion, or pneumothorax. No acute osseous abnormality is evident. IMPRESSION: Reticulonodular opacities in the lung bases compatible with bronchitis/bronchiolitis similar to prior CT of chest. Left basilar consolidation appears improved given differences in technique. No new consolidation. Electronically Signed   By: Kristine Garbe M.D.   On: 03/13/2018 17:14    Medications: I have reviewed the patient's current medications.  Assessment/Plan: 1. Left lung mass-CT biopsy 10/10/2017 revealed metastatic adenocarcinoma, focal CDX-2 positivity, TTF-1Napsin A,and cytokeratin 20negative. Positive for cytokeratin 7 and PAX 8 (patchy week)  PD-L1 score 0%, MSS, tumor mutation burden 8,BRIP1, noKRAS , BRAF, or NRASmutation  PET scan 10/03/2017-hypermetabolic 3 similar left lower lobe mass, low level metabolic activity associated with bibasilar nodular  and bandlike densities felt to be inflammatory, high activity in the tongue  and neck musculature  SBRTto left lung mass beginning 11/01/2017, completed 11/11/2017  Left-sided weakness 11/21/2017, CT/MRI-isolated right frontal metastasis with surrounding edema  SRS to the right frontal metastasis 12/08/2017  CT chest, abdomen, and pelvis 02/06/2018- decreased size of left lower lobe mass, small new nodules in the right upper lobe-infectious versus neoplastic, no evidence of metastatic disease in the abdomen and pelvis  MRI brain 03/09/2018-increased vasogenic edema surrounding the right superior frontal gyrus lesion,, mild decrease in the lesion size since November 2019  2. COPD 3. Pancreatitis 4. Chronic pain syndrome 5. Left renal cell carcinoma treated with RFA in 2009 and cryoablation in 2012 6. Remote history of heavy alcohol use 7. Ongoing tobacco use 8. History of a partial gastrectomy 9. 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 Oral candidiasis-improved with Diflucan 11. Admission 03/13/2018 with a febrile illness, likely viral upper respiratory infection, negative influenza and respiratory panel   [name] was admitted yesterday with a febrile illness.  She most likely has a viral upper respiratory infection.  A blood culture is pending.  She has advanced COPD.  She has a history of metastatic adenocarcinoma, most likely of lung origin.  She was treated with stereotactic radiosurgery to a right frontal metastasis in December 2019.  Left-sided weakness is related to the left frontal metastasis and surrounding edema.   The anemia is multifactorial.  I will check a repeat ferritin.  Recommendations: 1.  Antibiotics, COPD management per internal medicine 2.  Continue outpatient pain regimen 3.  Decadron as recommended by radiation oncology 4.  Outpatient follow-up will be scheduled at the Cancer center   LOS: 0 days   Betsy Coder, MD   03/14/2018, 7:25 AM

## 2018-03-14 NOTE — Consult Note (Signed)
[facility] Nurse wound consult note Reason for Consult: scattered partial thickness open areas on bilateral LEs, right hand full thickness skin tear Wound type:trauma Pressure Injury POA: NA Measurement: 6cm x 2cm x 0.2cm Wound bed:red, moist Drainage (amount, consistency, odor) serous, moderate Periwound:intact with scattered purpura Dressing procedure/placement/frequency: I have provided Nursing with recommendations for skin care eg., Prevalon boots bilaterally, incontinence care, wound care to the right hand and a sacral prophylactic dressing. All partial thickness areas on LEs may be treated with silicone foams. [facility] nursing team will not follow, but will remain available to this patient, the nursing and medical teams.  Please re-consult if needed.  Thanks, Maudie Flakes, MSN, RN, [place], Arther Abbott  Pager# [phone]

## 2018-03-14 NOTE — Progress Notes (Addendum)
Patient took two pills with water.  She choked on water and began coughing.  Coughed a lot and able to spit up a lot of sputum.  VS obtained.  Heart rate elevated prior.  Dr. Cruzita Lederer notified via text page.  Patient refused breathing treatment.  ST eval to be completed.

## 2018-03-14 NOTE — Progress Notes (Signed)
Patient refused lab draws this afternoon.  Dr. Cruzita Lederer notified via text page.

## 2018-03-14 NOTE — Care Management Obs Status (Signed)
[place] NOTIFICATION   Patient Details  Name: [name] MRN: [id number] Date of Birth: [birth year]   Medicare Observation Status Notification Given:  Yes, Husband Demetress Tift [phone] was called, Obs was explained to him.     Purcell Mouton, RN 03/14/2018, 4:10 PM

## 2018-03-14 NOTE — Progress Notes (Signed)
Pharmacy Antibiotic Note  [name] is a 68 y.o. female admitted on 03/13/2018 with sepsis.  Pharmacy has been consulted for Vancomycin, cefepime dosing.  Plan: Vancomycin 1gm iv x1, then Vancomycin 1000 mg IV Q 36 hrs. Goal AUC 400-550. Expected AUC: 508 SCr used: 0.8 (adjusted)  Cefepime 2gm iv q12hr   Height: 5\' 5"  (165.1 cm) Weight: 93 lb (42.2 kg) IBW/kg (Calculated) : 57  Temp (24hrs), Avg:99.3 F (37.4 C), Min:98.1 F (36.7 C), Max:101.1 F (38.4 C)  Recent Labs  Lab 03/13/18 1632 03/13/18 1643 03/13/18 1851 03/13/18 2002  WBC  --  QUESTIONABLE RESULTS, RECOMMEND RECOLLECT TO VERIFY SPECIMEN CLOTTED 9.4  CREATININE  --  0.67  --   --   LATICACIDVEN 1.7  --   --   --     Estimated Creatinine Clearance: 46.7 mL/min (by C-G formula based on SCr of 0.67 mg/dL).    Allergies  Allergen Reactions  . Aspirin Other (See Comments)    Break out in welts  . Chlorpromazine Hcl Other (See Comments)    Muscle spasms - "really bad"    Antimicrobials this admission: Vancomycin 03/13/2018 >> Cefepime 03/13/2018 >>   Dose adjustments this admission: -  Microbiology results: -  Thank you for allowing pharmacy to be a part of this patient's care.  Nani Skillern Crowford 03/14/2018 5:09 AM

## 2018-03-14 NOTE — Telephone Encounter (Signed)
Scheduled appt per 3/10 sch message - left message an sent reminder letter in the mail.

## 2018-03-14 NOTE — Evaluation (Addendum)
Clinical/Bedside Swallow Evaluation Patient Details  Name: [name] MRN: [id number] Date of Birth: March 19, 1950  Today's Date: 03/14/2018 Time: SLP Start Time (ACUTE ONLY): 1502 SLP Stop Time (ACUTE ONLY): 1528 SLP Time Calculation (min) (ACUTE ONLY): 26 min  Past Medical History:  Past Medical History:  Diagnosis Date  . Cancer ([place])    renal ca  . COPD (chronic obstructive pulmonary disease) ([place])   . Drug-seeking behavior   . Pancreatitis   . Pancreatitis   . Seizures ([place])   . Substance abuse [facility])    Past Surgical History:  Past Surgical History:  Procedure Laterality Date  . HIP SURGERY Bilateral   . IR GENERIC HISTORICAL  05/08/2015   IR RADIOLOGIST EVAL & MGMT 05/08/2015 Aletta Edouard, MD [facility] INTERV RAD  . IR GENERIC HISTORICAL  01/08/2014   IR RADIOLOGIST EVAL & MGMT 01/08/2014 Aletta Edouard, MD [facility] INTERV RAD  . IR RADIOLOGY PERIPHERAL GUIDED IV START  10/03/2017  . IR RADIOLOGY PERIPHERAL GUIDED IV START  10/10/2017  . IR US GUIDE VASC ACCESS LEFT  02/06/2018  . IR US GUIDE VASC ACCESS RIGHT  10/05/2017  . IR US GUIDE VASC ACCESS RIGHT  10/10/2017  . IR VENIPUNCTURE 41YRS/OLDER BY MD  02/06/2018  . KIDNEY SURGERY     removed cancerous lesions  . PARTIAL GASTRECTOMY     HPI:  68 yo female adm to [facility] with SIRS/dehydration/shortness of breath.  pt has h/o metastatic cancer to brain and lung, remote h/o ETOH/tobacco use, COPD, h/o chronic pain, pancreatitis, remote left renal carcinoma 2009, partial gastrectomy, anemia.  Pt found to have thrush and is on Diflucan.  Imaging study from February 2020 concerning for infectious process in lung.  Swallow eval ordered as pt overtly choked on liquids.    Assessment / Plan / Recommendation Clinical Impression  Pt presents with clinical indications of dysphagia and concern for aspiration.  She demonstrates decreased hoarse vocal quality and consisent throat clearing across all consistencies provided.  She did benefit from verbal  cue to "swallow" to clear peaches from oral cavity after extensive holding.   Pt does admit to some coughing with liquids more than foods prior to admission.  Recommend to modify her diet to full liquids - nectar and give medication with applesauce.  MBS planned for next am. Advised pt to SLP concern that she may be having difficulty with aspirating all consistencies but testing will elucidate dysphagia.  Pt agreeable to plan.  Given pt with recurrent admits, progressive weight loss, malnutrition and metastatic cancer thankful for palliative consult to establish goals of care. Pt advised she wants to "get well".   SLP Visit Diagnosis: Dysphagia, oropharyngeal phase (R13.12)    Aspiration Risk  Moderate aspiration risk;Risk for inadequate nutrition/hydration    Diet Recommendation Nectar-thick liquid;Other (Comment)(ice chips)   Liquid Administration via: Cup Medication Administration: Whole meds with puree Supervision: Patient able to self feed Compensations: Small sips/bites;Slow rate Postural Changes: Seated upright at 90 degrees;Remain upright for at least 30 minutes after po intake    Other  Recommendations Oral Care Recommendations: Oral care BID Other Recommendations: Order thickener from pharmacy;Have oral suction available   Follow up Recommendations (tbd)      Frequency and Duration     tbd       Prognosis Prognosis for Safe Diet Advancement: Guarded Barriers to Reach Goals: Cognitive deficits;Behavior;Severity of deficits      Swallow Study   General Date of Onset: 03/14/18 HPI: 68 yo female adm  to [facility] with SIRS/dehydration/shortness of breath.  pt has h/o metastatic cancer to brain and lung, remote h/o ETOH/tobacco use, COPD, h/o chronic pain, pancreatitis, remote left renal carcinoma 2009, partial gastrectomy, anemia.  Pt found to have thrush and is on Diflucan.  Imaging study from February 2020 concerning for infectious process in lung.  Swallow eval ordered as pt overtly  choked on liquids.  Type of Study: Bedside Swallow Evaluation Diet Prior to this Study: NPO Temperature Spikes Noted: No Respiratory Status: Nasal cannula History of Recent Intubation: No Behavior/Cognition: Alert;Confused;Impulsive Oral Cavity Assessment: Erythema Oral Care Completed by SLP: No Oral Cavity - Dentition: Dentures, top Vision: Functional for self-feeding Self-Feeding Abilities: Able to feed self Patient Positioning: Upright in bed Baseline Vocal Quality: Low vocal intensity Volitional Cough: Weak Volitional Swallow: Able to elicit    Oral/Motor/Sensory Function Overall Oral Motor/Sensory Function: Within functional limits   Ice Chips Ice chips: Impaired Pharyngeal Phase Impairments: Throat Clearing - Immediate   Thin Liquid Thin Liquid: Impaired Presentation: Cup;Self Fed;Straw;Spoon Pharyngeal  Phase Impairments: Cough - Immediate;Throat Clearing - Immediate    Nectar Thick Nectar Thick Liquid: Impaired Presentation: Cup;Straw Pharyngeal Phase Impairments: Throat Clearing - Immediate   Honey Thick Honey Thick Liquid: Not tested   Puree Puree: Not tested   Solid     Solid: Impaired Oral Phase Impairments: Reduced lingual movement/coordination;Impaired mastication Oral Phase Functional Implications: Prolonged oral transit Pharyngeal Phase Impairments: Suspected delayed Swallow Other Comments: pt stated "I swallowed" when she had not swallowed yet-       Macario Golds 03/14/2018,3:51 PM    Luanna Salk, [place] SLP Acute Rehab Services Pager [phone] Office [phone]

## 2018-03-14 NOTE — Progress Notes (Addendum)
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 intake as well as progressive left-sided weakness.  She has known brain mets status post SRS, most recent MRI on 03/09/2018 showed substantially progressed vasogenic edema surrounding the treated right superior frontal gyrus lesion with mild associated mass-effect but no midline shift.  Chest x-ray during this admission concerning for left lung base pneumonia.  Subjective: -Complains of congestion and productive cough this morning, denies any chest pain, denies any shortness of breath.  Also complains of left-sided weakness which has gotten worse.  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  Screening MRSA PCR was positive.  Respiratory viral panel was negative.  Blood cultures, sputum culture pending. -Speech to evaluate for aspiration.  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  Husband is at bedside is quite upset today regarding the SNF, he feels like they did not follow the radiation oncology recommendations and decreased the Decadron dose too fast. -Placed on IV Decadron 4 mg every 6 and monitor left-sided improvement  Dysphagia -Likely in the setting of FTT as well as weakness, speech therapy to see, continue antibiotics to cover for aspiration pneumonia as well  Severe protein calorie malnutrition -Nutrition consult  COPD exacerbation -She is getting steroids, nebulizers, antibiotics  Tobacco abuse -Admitting MD discussed cessation but patient not interested in quitting  Chronic pain/anxiety -Continue home medications  Pressure injury of the right ischial tuberosity, stage II -Wound care consulted  Oral thrush -Likely oral/esophageal candidiasis, placed on IV Diflucan, continue   Scheduled Meds: . Chlorhexidine Gluconate Cloth  6 each Topical Q0600  . dexamethasone  4 mg Intravenous Q6H  . docusate sodium  100 mg Oral BID  . famotidine  20 mg Oral QHS  . gabapentin  300 mg Oral QID  . guaiFENesin  600 mg Oral BID  . ipratropium  0.5 mg Nebulization Q6H  . mirtazapine  45 mg Oral QHS  . mupirocin ointment  1 application Nasal BID  . nystatin  5 mL Mouth/Throat QID  . oxyCODONE  20 mg Oral Q12H  . thiamine injection  100 mg Intravenous Daily   Continuous Infusions: . ceFEPime (MAXIPIME) IV 2 g (03/14/18 3818)  . fluconazole (DIFLUCAN) IV 200 mg (03/13/18 2157)  . metronidazole 500 mg (03/14/18 1031)  . sodium chloride    . [START ON 03/15/2018] vancomycin     PRN Meds:.acetaminophen **OR** acetaminophen, albuterol, bisacodyl, diazepam, ipratropium-albuterol, ondansetron **  OR** ondansetron (ZOFRAN) IV, oxyCODONE, polyethylene glycol  DVT prophylaxis: SCDs Code Status: DNR Family Communication: Husband present at bedside Disposition Plan: Back to SNF when  stable  Consultants:   Oncology  Procedures:   None   Antimicrobials:  Vancomycin 3/9--  Cefepime 3/9--  Metronidazole 3/9 --  Objective: Vitals:   03/13/18 2301 03/14/18 0542 03/14/18 0847 03/14/18 1037  BP:  (!) 153/113  (!) 145/85  Pulse:  (!) 110 (!) 109 (!) 128  Resp:  16  (!) 21  Temp:  99.5 F (37.5 C)  99.8 F (37.7 C)  TempSrc:  Oral  Oral  SpO2:  94%  99%  Weight: 42.2 kg     Height: 5\' 5"  (1.651 m)       Intake/Output Summary (Last 24 hours) at 03/14/2018 1049 Last data filed at 03/14/2018 8366 Gross per 24 hour  Intake 2290.79 ml  Output 300 ml  Net 1990.79 ml   Filed Weights   03/13/18 2301  Weight: 42.2 kg    Examination:  Constitutional: Ill-appearing Caucasian female Eyes: No scleral icterus ENMT: Mucous membranes are moist.  Diffuse white patchy fungal appearing thrush in the mouth and oropharynx Neck: normal, supple Respiratory: End expiratory wheezing, rhonchorous sounds at the bases, shallow respiration.  She is tachypneic. Cardiovascular: Regular rate and rhythm, no murmurs / rubs / gallops. No LE edema.  Tachycardic Abdomen: no tenderness. Bowel sounds positive.  Skin: no rashes Neurologic: Generalized weakness present, 1-2/5 on left upper and lower extremities and 5 out of 5 on right Psychiatric:  Alert and oriented x 3.    Data Reviewed: I have independently reviewed following labs and imaging studies   CBC: Recent Labs  Lab 03/13/18 1643 03/13/18 1851 03/13/18 2002  WBC QUESTIONABLE RESULTS, RECOMMEND RECOLLECT TO VERIFY SPECIMEN CLOTTED 9.4  NEUTROABS SPECIMEN CLOTTED SPECIMEN CLOTTED 8.2*  HGB QUESTIONABLE RESULTS, RECOMMEND RECOLLECT TO VERIFY SPECIMEN CLOTTED 8.1*  HCT QUESTIONABLE RESULTS, RECOMMEND RECOLLECT TO VERIFY SPECIMEN CLOTTED 28.9*  MCV QUESTIONABLE RESULTS, RECOMMEND RECOLLECT TO VERIFY SPECIMEN CLOTTED 94.8  PLT QUESTIONABLE RESULTS, RECOMMEND RECOLLECT TO VERIFY SPECIMEN CLOTTED 294   Basic Metabolic  Panel: Recent Labs  Lab 03/13/18 1643  NA 140  K 3.9  CL 101  CO2 30  GLUCOSE 95  BUN 19  CREATININE 0.67  CALCIUM 8.9  MG 1.9  PHOS 3.9   GFR: Estimated Creatinine Clearance: 46.7 mL/min (by C-G formula based on SCr of 0.67 mg/dL). Liver Function Tests: Recent Labs  Lab 03/13/18 1643  AST 15  ALT 36  ALKPHOS 88  BILITOT 0.7  PROT 6.5  ALBUMIN 3.1*   No results for input(s): LIPASE, AMYLASE in the last 168 hours. No results for input(s): AMMONIA in the last 168 hours. Coagulation Profile: No results for input(s): INR, PROTIME in the last 168 hours. Cardiac Enzymes: No results for input(s): CKTOTAL, CKMB, CKMBINDEX, TROPONINI in the last 168 hours. BNP (last 3 results) No results for input(s): PROBNP in the last 8760 hours. HbA1C: No results for input(s): HGBA1C in the last 72 hours. CBG: No results for input(s): GLUCAP in the last 168 hours. Lipid Profile: No results for input(s): CHOL, HDL, LDLCALC, TRIG, CHOLHDL, LDLDIRECT in the last 72 hours. Thyroid Function Tests: No results for input(s): TSH, T4TOTAL, FREET4, T3FREE, THYROIDAB in the last 72 hours. Anemia Panel: No results for input(s): VITAMINB12, FOLATE, FERRITIN, TIBC, IRON, RETICCTPCT in the last 72 hours. Urine analysis:    Component Value Date/Time   COLORURINE YELLOW 03/13/2018 1855  APPEARANCEUR CLEAR 03/13/2018 1855   LABSPEC 1.009 03/13/2018 1855   PHURINE 6.0 03/13/2018 1855   GLUCOSEU NEGATIVE 03/13/2018 1855   HGBUR MODERATE (A) 03/13/2018 1855   BILIRUBINUR NEGATIVE 03/13/2018 1855   BILIRUBINUR negative 06/03/2017 1120   KETONESUR NEGATIVE 03/13/2018 1855   PROTEINUR NEGATIVE 03/13/2018 1855   UROBILINOGEN 0.2 06/03/2017 1120   UROBILINOGEN 0.2 03/15/2017 1004   NITRITE NEGATIVE 03/13/2018 1855   LEUKOCYTESUR NEGATIVE 03/13/2018 1855   Sepsis Labs: Invalid input(s): PROCALCITONIN, LACTICIDVEN  Recent Results (from the past 240 hour(s))  Blood Culture (routine x 2)     Status:  None (Preliminary result)   Collection Time: 03/13/18  4:32 PM  Result Value Ref Range Status   Specimen Description   Final    BLOOD RIGHT ANTECUBITAL Performed at [facility], [place] [address] [place], [place] [address]    Special Requests   Final    BOTTLES DRAWN AEROBIC AND ANAEROBIC Blood Culture results may not be optimal due to an excessive volume of blood received in culture bottles Performed at [facility] [address] [place], [place] [address]    Culture   Final    NO GROWTH < 12 HOURS Performed at [facility] [address] [place], [place] [address]    Report Status PENDING  Incomplete  Respiratory Panel by PCR     Status: None   Collection Time: 03/13/18 10:45 PM  Result Value Ref Range Status   Adenovirus NOT DETECTED NOT DETECTED Final   Coronavirus 229E NOT DETECTED NOT DETECTED Final    Comment: (NOTE) The Coronavirus on the Respiratory Panel, DOES NOT test for the novel  Coronavirus (2019 nCoV)    Coronavirus HKU1 NOT DETECTED NOT DETECTED Final   Coronavirus NL63 NOT DETECTED NOT DETECTED Final   Coronavirus OC43 NOT DETECTED NOT DETECTED Final   Metapneumovirus NOT DETECTED NOT DETECTED Final   Rhinovirus / Enterovirus NOT DETECTED NOT DETECTED Final   Influenza A NOT DETECTED NOT DETECTED Final   Influenza B NOT DETECTED NOT DETECTED Final   Parainfluenza Virus 1 NOT DETECTED NOT DETECTED Final   Parainfluenza Virus 2 NOT DETECTED NOT DETECTED Final   Parainfluenza Virus 3 NOT DETECTED NOT DETECTED Final   Parainfluenza Virus 4 NOT DETECTED NOT DETECTED Final   Respiratory Syncytial Virus NOT DETECTED NOT DETECTED Final   Bordetella pertussis NOT DETECTED NOT DETECTED Final   Chlamydophila pneumoniae NOT DETECTED NOT DETECTED Final   Mycoplasma pneumoniae NOT DETECTED NOT DETECTED Final    Comment: Performed at [facility], [place]. [address], [place], [place] [address]  MRSA PCR Screening     Status: Abnormal    Collection Time: 03/14/18 12:00 AM  Result Value Ref Range Status   MRSA by PCR POSITIVE (A) NEGATIVE Final    Comment:        The GeneXpert MRSA Assay (FDA approved for NASAL specimens only), is one component of a comprehensive MRSA colonization surveillance program. It is not intended to diagnose MRSA infection nor to guide or monitor treatment for MRSA infections. CRITICAL RESULT CALLED TO, READ BACK BY AND VERIFIED WITH:  RN patram l. at 0502 03/14/18 [place]ank a Performed at [facility], [place] [address] [place], [place] [address]       Radiology Studies: Abd 1 View (kub)  Result Date: 03/13/2018 CLINICAL DATA:  Abdominal distension. EXAM: ABDOMEN - 1 VIEW COMPARISON:  CT abdomen and pelvis February 06, 2018 FINDINGS:  Bowel gas pattern is nondilated and nonobstructive. Advanced vascular calcifications. Surgical staples LEFT upper quadrant. Surgical clips project RIGHT pelvis. No intra-abdominal mass effect, pathologic calcifications or free air. Rounded calcification LEFT mid abdomen corresponding to renal lesion on prior CT. Soft tissue planes and included osseous structures are nonsuspicious. LEFT femur pinning. IMPRESSION: Nonspecific bowel gas pattern. Aortic Atherosclerosis (ICD10-I70.0). Electronically Signed   By: Elon Alas M.D.   On: 03/13/2018 23:25   Dg Chest Port 1 View  Result Date: 03/13/2018 CLINICAL DATA:  68 y/o F; sepsis. History of metastatic adenocarcinoma of unknown primary. EXAM: PORTABLE CHEST 1 VIEW COMPARISON:  02/06/2018 CT chest. FINDINGS: The heart size and mediastinal contours are within normal limits. Aortic calcific atherosclerosis. Reticulonodular opacities in the lung bases compatible with bronchitis/bronchiolitis similar to prior CT of chest. Left basilar consolidation appears improved. No new consolidation, effusion, or pneumothorax. No acute osseous abnormality is evident. IMPRESSION: Reticulonodular opacities in the lung  bases compatible with bronchitis/bronchiolitis similar to prior CT of chest. Left basilar consolidation appears improved given differences in technique. No new consolidation. Electronically Signed   By: Kristine Garbe M.D.   On: 03/13/2018 17:14    Marzetta Board, MD, PhD [facility]  Contact via  [email or web address]  [place] P: [phone]  F: [phone]

## 2018-03-15 ENCOUNTER — Inpatient Hospital Stay (HOSPITAL_COMMUNITY): Payer: Medicare Other

## 2018-03-15 DIAGNOSIS — C7931 Secondary malignant neoplasm of brain: Secondary | ICD-10-CM | POA: Diagnosis not present

## 2018-03-15 DIAGNOSIS — Z85528 Personal history of other malignant neoplasm of kidney: Secondary | ICD-10-CM | POA: Diagnosis not present

## 2018-03-15 DIAGNOSIS — J449 Chronic obstructive pulmonary disease, unspecified: Secondary | ICD-10-CM | POA: Diagnosis not present

## 2018-03-15 DIAGNOSIS — C3492 Malignant neoplasm of unspecified part of left bronchus or lung: Secondary | ICD-10-CM | POA: Diagnosis not present

## 2018-03-15 DIAGNOSIS — K859 Acute pancreatitis without necrosis or infection, unspecified: Secondary | ICD-10-CM | POA: Diagnosis not present

## 2018-03-15 DIAGNOSIS — I10 Essential (primary) hypertension: Secondary | ICD-10-CM | POA: Diagnosis not present

## 2018-03-15 DIAGNOSIS — R918 Other nonspecific abnormal finding of lung field: Secondary | ICD-10-CM | POA: Diagnosis not present

## 2018-03-15 LAB — COMPREHENSIVE METABOLIC PANEL
ALK PHOS: 65 U/L (ref 38–126)
ALT: 38 U/L (ref 0–44)
AST: 28 U/L (ref 15–41)
Albumin: 2.2 g/dL — ABNORMAL LOW (ref 3.5–5.0)
Anion gap: 11 (ref 5–15)
BUN: 17 mg/dL (ref 8–23)
CO2: 20 mmol/L — ABNORMAL LOW (ref 22–32)
Calcium: 7.9 mg/dL — ABNORMAL LOW (ref 8.9–10.3)
Chloride: 109 mmol/L (ref 98–111)
Creatinine, Ser: 0.61 mg/dL (ref 0.44–1.00)
GFR calc Af Amer: >60 mL/min (ref >60)
GFR calc non Af Amer: >60 mL/min (ref >60)
GLUCOSE: 188 mg/dL — AB (ref 70–99)
Potassium: 3.8 mmol/L (ref 3.5–5.1)
Sodium: 140 mmol/L (ref 135–145)
Total Bilirubin: 0.3 mg/dL (ref 0.3–1.2)
Total Protein: 4.7 g/dL — ABNORMAL LOW (ref 6.5–8.1)

## 2018-03-15 MED ORDER — SODIUM CHLORIDE 0.9 % IV SOLN
INTRAVENOUS | Status: AC
  Administered 2018-03-15: 01:00:00 via INTRAVENOUS

## 2018-03-15 MED ORDER — VITAMIN B-1 100 MG PO TABS
100.0000 mg | ORAL_TABLET | Freq: Every day | ORAL | Status: DC
  Administered 2018-03-16: 100 mg via ORAL
  Filled 2018-03-15 (×2): qty 1

## 2018-03-15 MED ORDER — DEXAMETHASONE 4 MG PO TABS
4.0000 mg | ORAL_TABLET | Freq: Three times a day (TID) | ORAL | Status: DC
  Administered 2018-03-15 – 2018-03-16 (×3): 4 mg via ORAL
  Filled 2018-03-15 (×4): qty 1

## 2018-03-15 NOTE — Progress Notes (Signed)
I was able to speak to the patient today unaccompanied. I shared with her my discussion with the administrators at [facility] and shared that they were able to show documentation of correct administration of her steroids based on orders. I also shared with the patient that if she were to go back to that facility, she really should reconsider the frequency of her participation with PT. According to [facility], she was only participating 3x a week despite other recommendations from the therapists. I encouraged the patient to reconsider this which she states she will. I also asked her if she was leaving the facility because she wanted to and she did indicate that the times she left [place] were because she indeed wanted to leave. I let her know that the more she is at the facility, the better she can be monitored by RNs taking care of her who had they had the opportunity, would have been able to detect her fever sooner than when she presented to our clinic. I also confirmed with the patient she feels safe in her home environment. We will plan to follow up with an MRI at the interval recommended by Dr. Mickeal Skinner regarding her prior brain treatment as an outpatient but would be happy to see her back sooner as indicated. In addition, Dr. Benay Spice has started to taper her dexamethasone to 4 mg TID given her improvement with what has been administered thusfar.    Carola Rhine, PAC

## 2018-03-15 NOTE — Progress Notes (Signed)
  Speech Language Pathology Treatment: Dysphagia  Patient Details Name: [name] MRN: [id number] DOB: 02/15/1950 Today's Date: 03/15/2018 Time: 1450-1505 SLP Time Calculation (min) (ACUTE ONLY): 15 min  Assessment / Plan / Recommendation Clinical Impression  SLP consulted with RN and nurse tech who continue to report pt continuing with overt coughing with thin liquids.  Per NT, pt was tolerating thicker liquids well without overt coughing.  SLP reviewed MBS compared to normal MBS and advised her to ongoing going concerns for aspiration despite lack of aspiration on MBS.  Pt's delayed pharyngeal swallow trigger in concerning (? Due to frontal mets) for aspiration - and thus thicker liquid allows more bolus cohesion to mitigate aspiration.  Aspiration will not be fully prevented in this unfortunate pt.  She does admit that using thickener decreasing overt coughing.  Discussed difference in asp cough vs COPD related cough.  Demonstrated use of thickener to her to thicken her soda.  Will follow up for dysphagia management - advised her to follow strict precautions - and SLP to follow up next date. Also advised pt to monitor po tolerance well and inform SLP if she has difficulties and consistencies that were problematic.  Using teach back, pt educated.    HPI HPI: 68 yo female adm to [facility] with SIRS/dehydration/shortness of breath.  pt has h/o metastatic cancer to brain and lung, remote h/o ETOH/tobacco use, COPD, h/o chronic pain, pancreatitis, remote left renal carcinoma 2009, partial gastrectomy, anemia.  Pt found to have thrush and is on Diflucan.  Imaging study from February 2020 concerning for infectious process in lung.  Swallow eval ordered as pt overtly choked on liquids.       SLP Plan  Continue with current plan of care       Recommendations  Diet recommendations: Dysphagia 3 (mechanical soft);Nectar-thick liquid Liquids provided via: Cup;No straw Medication Administration: Whole  meds with puree Supervision: Patient able to self feed Compensations: Small sips/bites;Slow rate Postural Changes and/or Swallow Maneuvers: Seated upright 90 degrees;Upright 30-60 min after meal(90 degrees upright!!)                Oral Care Recommendations: Oral care BID Follow up Recommendations: (tbd) SLP Visit Diagnosis: Dysphagia, oropharyngeal phase (R13.12) Plan: Continue with current plan of care       [place], [place] [facility] SLP Acute Rehab Services Pager [phone] Office [phone]   [name] 03/15/2018, 3:13 PM

## 2018-03-15 NOTE — Progress Notes (Signed)
IP PROGRESS NOTE  Subjective:   Ms. Braatz reports feeling well.  She wants to go home.  Her husband is at the bedside.    Objective: Vital signs in last 24 hours: Blood pressure 124/83, pulse 84, temperature 97.7 F (36.5 C), temperature source Oral, resp. rate (!) 22, height _0  (1.651 m), weight 93 lb (42.2 kg), SpO2 100 %.  Intake/Output from previous day: 03/10 0701 - 03/11 0700 In: 2379 [P.O.:600; I.V.:1315.2; IV Piggyback:463.8] Out: -   Physical Exam:  HEENT: No thrush  Neurologic: Alert, follows commands.  3/5 strength in the left arm/hand and left leg   Lab Results: Recent Labs    03/13/18 1851 03/13/18 2002  WBC SPECIMEN CLOTTED 9.4  HGB SPECIMEN CLOTTED 8.1*  HCT SPECIMEN CLOTTED 28.9*  PLT SPECIMEN CLOTTED 202    BMET Recent Labs    03/13/18 1643 03/15/18 0451  NA 140 140  K 3.9 3.8  CL 101 109  CO2 30 20*  GLUCOSE 95 188*  BUN 19 17  CREATININE 0.67 0.61  CALCIUM 8.9 7.9*    Lab Results  Component Value Date   CEA1 7.08 (H) 10/19/2017    Studies/Results: Abd 1 View (kub)  Result Date: 03/13/2018 CLINICAL DATA:  Abdominal distension. EXAM: ABDOMEN - 1 VIEW COMPARISON:  CT abdomen and pelvis February 06, 2018 FINDINGS: Bowel gas pattern is nondilated and nonobstructive. Advanced vascular calcifications. Surgical staples LEFT upper quadrant. Surgical clips project RIGHT pelvis. No intra-abdominal mass effect, pathologic calcifications or free air. Rounded calcification LEFT mid abdomen corresponding to renal lesion on prior CT. Soft tissue planes and included osseous structures are nonsuspicious. LEFT femur pinning. IMPRESSION: Nonspecific bowel gas pattern. Aortic Atherosclerosis (ICD10-I70.0). Electronically Signed   By: Elon Alas M.D.   On: 03/13/2018 23:25   Dg Chest Port 1 View  Result Date: 03/13/2018 CLINICAL DATA:  67 y/o F; sepsis. History of metastatic adenocarcinoma of unknown primary. EXAM: PORTABLE CHEST 1 VIEW COMPARISON:   02/06/2018 CT chest. FINDINGS: The heart size and mediastinal contours are within normal limits. Aortic calcific atherosclerosis. Reticulonodular opacities in the lung bases compatible with bronchitis/bronchiolitis similar to prior CT of chest. Left basilar consolidation appears improved. No new consolidation, effusion, or pneumothorax. No acute osseous abnormality is evident. IMPRESSION: Reticulonodular opacities in the lung bases compatible with bronchitis/bronchiolitis similar to prior CT of chest. Left basilar consolidation appears improved given differences in technique. No new consolidation. Electronically Signed   By: Kristine Garbe M.D.   On: 03/13/2018 17:14   Dg Swallowing Func-speech Pathology  Result Date: 03/15/2018 Objective Swallowing Evaluation: Type of Study: Bedside Swallow Evaluation  Patient Details Name: SHALLON YAKLIN MRN: [id number] Date of Birth: [birth year] Today's Date: 03/15/2018 Time: SLP Start Time (ACUTE ONLY): 3754 -SLP Stop Time (ACUTE ONLY): 0840 SLP Time Calculation (min) (ACUTE ONLY): 26 min Past Medical History: Past Medical History: Diagnosis Date . Cancer ([place])   renal ca . COPD (chronic obstructive pulmonary disease) ([place])  . Drug-seeking behavior  . Pancreatitis  . Pancreatitis  . Seizures ([place])  . Substance abuse [facility])  Past Surgical History: Past Surgical History: Procedure Laterality Date . HIP SURGERY Bilateral  . IR GENERIC HISTORICAL  05/08/2015  IR RADIOLOGIST EVAL & MGMT 05/08/2015 Aletta Edouard, MD [facility] INTERV RAD . IR GENERIC HISTORICAL  01/08/2014  IR RADIOLOGIST EVAL & MGMT 01/08/2014 Aletta Edouard, MD [facility] INTERV RAD . IR RADIOLOGY PERIPHERAL GUIDED IV START  10/03/2017 . IR RADIOLOGY PERIPHERAL GUIDED IV START  10/10/2017 . IR  US GUIDE VASC ACCESS LEFT  02/06/2018 . IR US GUIDE VASC ACCESS RIGHT  10/05/2017 . IR US GUIDE VASC ACCESS RIGHT  10/10/2017 . IR VENIPUNCTURE 47YRS/OLDER BY MD  02/06/2018 . KIDNEY SURGERY    removed cancerous lesions . PARTIAL  GASTRECTOMY   HPI: 68 yo female adm to [facility] with SIRS/dehydration/shortness of breath.  pt has h/o metastatic cancer to brain and lung, remote h/o ETOH/tobacco use, COPD, h/o chronic pain, pancreatitis, remote left renal carcinoma 2009, partial gastrectomy, anemia.  Pt found to have thrush and is on Diflucan.  Imaging study from February 2020 concerning for infectious process in lung.  Swallow eval ordered as pt overtly choked on liquids.  Subjective: pt awake in chair Assessment / Plan / Recommendation CHL IP CLINICAL IMPRESSIONS 03/15/2018 Clinical Impression Patient presents with surprisingly functional oropharyngeal swallow mild deficits.  Mild delay with premature oral spillage noted with delay in pharyngeal swallow due to decreased sensation.  Pt swallow initiation delays in pharynx up to 6 seconds spilling to pyriform sinus with all consistencies - which will allow episodic aspiration.  Pt did not transit tablet with puree as it separated - thus continue medications crushed.  Pt did fortunately cough during testing without aspiration/penetration.  Recommend pt sit FULLY upright and only eat when not dyspneic.  Will follow up for education and compensation with pt/family.  Using teach back with live monitor, pt educated to findings. l SLP Visit Diagnosis Dysphagia, oropharyngeal phase (R13.12) Attention and concentration deficit following -- Frontal lobe and executive function deficit following -- Impact on safety and function Mild aspiration risk   CHL IP TREATMENT RECOMMENDATION 03/15/2018 Treatment Recommendations Therapy as outlined in treatment plan below   Prognosis 03/15/2018 Prognosis for Safe Diet Advancement Guarded Barriers to Reach Goals Cognitive deficits;Behavior;Severity of deficits Barriers/Prognosis Comment -- CHL IP DIET RECOMMENDATION 03/15/2018 SLP Diet Recommendations Dysphagia 3 (Mech soft) solids;Thin liquid Liquid Administration via Cup;Straw Medication Administration Crushed with puree  Compensations Minimize environmental distractions;Slow rate;Small sips/bites Postural Changes Remain semi-upright after after feeds/meals (Comment);Seated upright at 90 degrees   CHL IP OTHER RECOMMENDATIONS 03/15/2018 Recommended Consults -- Oral Care Recommendations Oral care QID Other Recommendations --   CHL IP FOLLOW UP RECOMMENDATIONS 03/14/2018 Follow up Recommendations (No Data)   CHL IP FREQUENCY AND DURATION 03/15/2018 Speech Therapy Frequency (ACUTE ONLY) min 1 x/week Treatment Duration 1 week      CHL IP ORAL PHASE 03/15/2018 Oral Phase Impaired Oral - Pudding Teaspoon -- Oral - Pudding Cup -- Oral - Honey Teaspoon -- Oral - Honey Cup -- Oral - Nectar Teaspoon -- Oral - Nectar Cup Premature spillage Oral - Nectar Straw -- Oral - Thin Teaspoon -- Oral - Thin Cup Premature spillage Oral - Thin Straw Premature spillage Oral - Puree Premature spillage;Weak lingual manipulation;Piecemeal swallowing Oral - Mech Soft Premature spillage;Weak lingual manipulation;Impaired mastication;Piecemeal swallowing Oral - Regular -- Oral - Multi-Consistency -- Oral - Pill Premature spillage;Weak lingual manipulation Oral Phase - Comment --  CHL IP PHARYNGEAL PHASE 03/15/2018 Pharyngeal Phase Impaired Pharyngeal- Pudding Teaspoon -- Pharyngeal -- Pharyngeal- Pudding Cup -- Pharyngeal -- Pharyngeal- Honey Teaspoon -- Pharyngeal -- Pharyngeal- Honey Cup -- Pharyngeal -- Pharyngeal- Nectar Teaspoon -- Pharyngeal -- Pharyngeal- Nectar Cup Delayed swallow initiation-vallecula;Delayed swallow initiation-pyriform sinuses Pharyngeal -- Pharyngeal- Nectar Straw -- Pharyngeal -- Pharyngeal- Thin Teaspoon -- Pharyngeal -- Pharyngeal- Thin Cup Delayed swallow initiation-vallecula;Delayed swallow initiation-pyriform sinuses Pharyngeal -- Pharyngeal- Thin Straw Delayed swallow initiation-vallecula;Delayed swallow initiation-pyriform sinuses Pharyngeal -- Pharyngeal- Puree Delayed swallow initiation-vallecula;Delayed swallow initiation-pyriform  sinuses Pharyngeal -- Pharyngeal- Mechanical Soft Delayed swallow initiation-vallecula;Delayed swallow initiation-pyriform sinuses Pharyngeal -- Pharyngeal- Regular -- Pharyngeal -- Pharyngeal- Multi-consistency -- Pharyngeal -- Pharyngeal- Pill NT Pharyngeal -- Pharyngeal Comment --  CHL IP CERVICAL ESOPHAGEAL PHASE 03/15/2018 Cervical Esophageal Phase WFL Pudding Teaspoon -- Pudding Cup -- Honey Teaspoon -- Honey Cup -- Nectar Teaspoon -- Nectar Cup -- Nectar Straw -- Thin Teaspoon -- Thin Cup -- Thin Straw -- Puree -- Mechanical Soft -- Regular -- Multi-consistency -- Pill -- Cervical Esophageal Comment -- Upon esophageal sweep, pt appeared clear in esophagus.  Luanna Salk, MS [facility] SLP Acute Rehab Services Pager [phone] Office [phone] Macario Golds 03/15/2018, 9:03 AM               Medications: I have reviewed the patient's current medications.  Assessment/Plan: 1. Left lung mass-CT biopsy 10/10/2017 revealed metastatic adenocarcinoma, focal CDX-2 positivity, TTF-1Napsin A,and cytokeratin 20negative. Positive for cytokeratin 7 and PAX 8 (patchy week)  PD-L1 score 0%, MSS, tumor mutation burden 8,BRIP1, noKRAS , BRAF, or NRASmutation  PET scan 10/03/2017-hypermetabolic 3 similar left lower lobe mass, low level metabolic activity associated with bibasilar nodular and bandlike densities felt to be inflammatory, high activity in the tongue and neck musculature  SBRTto left lung mass beginning 11/01/2017, completed 11/11/2017  Left-sided weakness 11/21/2017, CT/MRI-isolated right frontal metastasis with surrounding edema  SRS to the right frontal metastasis 12/08/2017  CT chest, abdomen, and pelvis 02/06/2018- decreased size of left lower lobe mass, small new nodules in the right upper lobe-infectious versus neoplastic, no evidence of metastatic disease in the abdomen and pelvis  MRI brain 03/09/2018-increased vasogenic edema surrounding the right superior frontal gyrus lesion,,  mild decrease in the lesion size since November 2019  2. COPD 3. Pancreatitis 4. Chronic pain syndrome 5. Left renal cell carcinoma treated with RFA in 2009 and cryoablation in 2012 6. Remote history of heavy alcohol use 7. Ongoing tobacco use 8. History of a partial gastrectomy 9. 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 Oral candidiasis-improved with Diflucan 11. Admission 03/13/2018 with a febrile illness, likely viral upper respiratory infection, negative influenza and respiratory panel, blood culture negative   Ms. Qian appears improved.  She was admitted with a febrile illness, most likely a viral upper respiratory infection in the setting of advanced COPD. She has metastatic carcinoma, likely of lung origin.  She has advanced stage COPD.  I discussed hospice and palliative care with Ms. Belding and her husband.  I feel she qualifies for hospice care based on her cancer diagnosis and comorbid conditions.  They do not wish to consider hospice at present.  They report meeting with the palliative care team in the past.  She is hopeful of improving with physical therapy and being able to return home.  They would like to consider systemic treatment options if she improves.  The left-sided weakness appears partially improved with a higher dose Decadron.  I will change the Decadron to oral dosing.  I recommend 4 mg 3 times daily for 5 days then 4 mg twice daily.  Further taper can be directed by radiation oncology  Recommendations: 1.  Antibiotics, COPD management per internal medicine 2.  Continue outpatient pain regimen 3.  Decadron taper as above 4.  Outpatient follow-up will be scheduled at the Cancer center   LOS: 1 day   Betsy Coder, MD   03/15/2018, 9:18 AM

## 2018-03-15 NOTE — Progress Notes (Signed)
Assumed care from previous RN. Agree with earlier assessment. Continue to monitor pt. Eulas Post, RN

## 2018-03-15 NOTE — Progress Notes (Signed)
PHARMACIST - PHYSICIAN COMMUNICATION  DR:   Louanne Belton  CONCERNING: IV to Oral Route Change Policy  RECOMMENDATION: This patient is receiving thiamine by the intravenous route.  Based on criteria approved by the Pharmacy and Therapeutics Committee, the intravenous medication(s) is/are being converted to the equivalent oral dose form(s).   DESCRIPTION: These criteria include:  The patient is eating (either orally or via tube) and/or has been taking other orally administered medications for a least 24 hours  The patient has no evidence of active gastrointestinal bleeding or impaired GI absorption (gastrectomy, short bowel, patient on TNA or NPO).  If you have questions about this conversion, please contact the Pharmacy Department  []   [phone] )  [facility] []   [phone] )  [facility] []   [phone] )  [facility] []   [phone] )  [facility] [x]   [phone] )  [place], PharmD, BCPS 03/15/2018 7:46 AM

## 2018-03-15 NOTE — Progress Notes (Signed)
Modified Barium Swallow Progress Note  Patient Details  Name: AMERIKA NOURSE MRN: [id number] Date of Birth: 1950/01/09  Today's Date: 03/15/2018  Modified Barium Swallow completed.  Full report located under Chart Review in the Imaging Section.  Brief recommendations include the following:  Clinical Impression  Patient presents with surprisingly functional oropharyngeal swallow mild deficits.  Mild delay with premature oral spillage noted with delay in pharyngeal swallow due to decreased sensation.  Pt swallow initiation delays in pharynx up to 6 seconds spilling to pyriform sinus with all consistencies - which will allow episodic aspiration.  Pt did not transit tablet with puree as it separated - thus continue medications crushed.  Pt did fortunately cough during testing without aspiration/penetration.  Recommend pt sit FULLY upright and only eat when not dyspneic.  Will follow up for education and compensation with pt/family.  Using teach back with live monitor, pt educated to findings. l   Swallow Evaluation Recommendations       SLP Diet Recommendations: Dysphagia 3 (Mech soft) solids;Thin liquid   Liquid Administration via: Cup;Straw   Medication Administration: Crushed with puree   Supervision: Patient able to self feed   Compensations: Minimize environmental distractions;Slow rate;Small sips/bites   Postural Changes: Remain semi-upright after after feeds/meals (Comment);Seated upright at 90 degrees   Oral Care Recommendations: Oral care QID      Luanna Salk, MS [place] Pager [phone] Office [phone]   Macario Golds 03/15/2018,9:00 AM

## 2018-03-15 NOTE — TOC Initial Note (Signed)
Transition of Care [facility]) - Initial/Assessment Note    Patient Details  Name: [name] MRN: [id number] Date of Birth: 20-Aug-1950  [facility] CM/SW Contact:    Lia Hopping, [place] Phone Number: 03/15/2018, 2:25 PM  Clinical Narrative:          CSW met the patient and her spouse and addressed concerns about the patient wanting to go a different SNF. Patient spouse express concerns about the patient medication management at SNF. CSW offered to discuss these concerns with the staff at the SNF. Patient spouse request CSW "just let it be." Patient reports she wants to go back to SNF because the facility will allow her smoke.  CSW reached out to SNF to confirm the patient is able to return.   Per NP (3/11) progress note, the medications concerns at the facility were addressed, and the SNF provided documentation about her medication regimen.          Expected Discharge Plan: Skilled Nursing Facility Barriers to Discharge: No Barriers Identified   Patient Goals and CMS Choice Patient states their goals for this hospitalization and ongoing recovery are:: Return to SNF    Choice offered to / list presented to : NA  Expected Discharge Plan and Services Expected Discharge Plan: [place] Choice: Nursing Home Living arrangements for the past 2 months: [place] Expected Discharge Date: (unknown)                        Prior Living Arrangements/Services Living arrangements for the past 2 months: [place] Lives with:: Facility Resident Patient language and need for interpreter reviewed:: Yes Do you feel safe going back to the place where you live?: Yes      Need for Family Participation in Patient Care: Yes (Comment) Care giver support system in place?: Yes (comment)   Criminal Activity/Legal Involvement Pertinent to Current Situation/Hospitalization: No - Comment as needed  Activities of Daily  Living Home Assistive Devices/Equipment: Hospital bed, Wheelchair, Dentures (specify type)(full upper denture) ADL Screening (condition at time of admission) Patient's cognitive ability adequate to safely complete daily activities?: Yes Is the patient deaf or have difficulty hearing?: No Does the patient have difficulty seeing, even when wearing glasses/contacts?: No Does the patient have difficulty concentrating, remembering, or making decisions?: No Patient able to express need for assistance with ADLs?: Yes Does the patient have difficulty dressing or bathing?: Yes Independently performs ADLs?: No Communication: Independent Dressing (OT): Needs assistance Is this a change from baseline?: Pre-admission baseline Grooming: Needs assistance Is this a change from baseline?: Pre-admission baseline Feeding: Needs assistance Is this a change from baseline?: Pre-admission baseline Bathing: Needs assistance Is this a change from baseline?: Pre-admission baseline Toileting: Needs assistance Is this a change from baseline?: Pre-admission baseline In/Out Bed: Needs assistance Is this a change from baseline?: Pre-admission baseline Walks in Home: Dependent Is this a change from baseline?: Pre-admission baseline Does the patient have difficulty walking or climbing stairs?: Yes Weakness of Legs: Left(Paralyzed on left side) Weakness of Arms/Hands: Left(paralyzed on left side)  Permission Sought/Granted Permission sought to share information with : Facility Sport and exercise psychologist, Family Supports Permission granted to share information with : Yes, Verbal Permission Granted     Permission granted to share info w AGENCY: [employer] granted to share info w Relationship: Spouse      Emotional Assessment Appearance:: Appears stated age   Affect (  typically observed): Appropriate Orientation: : Oriented to Self Alcohol / Substance Use: Not Applicable Psych Involvement: No  (comment)  Admission diagnosis:  Upper respiratory tract infection, unspecified type [J06.9] Sepsis, due to unspecified organism, unspecified whether acute organ dysfunction present ([place]) [A41.9] SIRS (systemic inflammatory response syndrome) ([place]) [R65.10] Patient Active Problem List   Diagnosis Date Noted  . SIRS (systemic inflammatory response syndrome) ([place]) 03/13/2018  . Thrush 03/13/2018  . Dehydration 03/13/2018  . Venous stasis ulcer ([place]) 03/13/2018  . DNR (do not resuscitate)   . Palliative care by specialist   . Chronic pain syndrome   . Cellulitis of left foot 12/10/2017  . Weakness 12/10/2017  . Cellulitis 12/09/2017  . Right hemiplegia ([place]) 11/21/2017  . Metastatic adenocarcinoma to brain with unknown primary site [facility]) 11/21/2017  . Lung mass 10/04/2017  . Osteoarthritis of facet joint of lumbar spine 07/26/2017  . Low back pain radiating to both legs 07/26/2017  . 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 Adult failure to thrive 06/21/2017  . Pressure injury of skin 04/06/2017  . Pressure injury of skin of right ischial tuberosity region: Stage 2 04/06/2017  . Benzodiazepine dependence ([place])   . Narcotic addiction ([place]) 02/29/2016  . Abdominal pain, chronic, epigastric- due to chronic pancreatitis 02/29/2016  . COPD (chronic obstructive pulmonary disease) ([place]) 02/28/2016  . Cough 09/03/2015  . Abdominal pain 01/03/2015  . Hyponatremia 01/03/2015  . Hypocalcemia 01/03/2015  . Underweight 01/03/2015  . COPD GOLD III with reversible component  07/17/2014  . Chronic alcoholic pancreatitis ([place]) 06/02/2013  . Recurrent acute pancreatitis 03/28/2013  . Alcohol withdrawal ([place]) 03/28/2013  . Pancreatitis 03/09/2013  . Tobacco abuse 03/09/2013  . Alcohol withdrawal syndrome with perceptual disturbance ([place]) 03/09/2013  . Benzodiazepine withdrawal ([place]) 02/09/2013  . Protein-calorie malnutrition, severe ([place]) 02/09/2013  . Hypokalemia  02/27/2011  . Abdominal pain, acute 02/25/2011  . Nausea and vomiting 02/25/2011  . Thrombocytopenia ([place]) 02/25/2011  . HTN (hypertension), malignant 02/25/2011  . Malignant neoplasm of kidney excluding renal pelvis ([place]) 10/18/2008  . Anxiety state 10/18/2008  . GERD 10/18/2008  . PANCREATITIS, CHRONIC- atrophic pancreas 10/18/2008  . DYSPNEA 10/18/2008  . GASTRIC ULCER, HX OF 10/18/2008   PCP:  Azzie Glatter, FNP Pharmacy:   [facility] [address] [place] [address] [place] [address] Phone: [phone] Fax: [phone]  [place], [place] - [address] [place] [address] Phone: [phone] Fax: [phone]     Social Determinants of Health (SDOH) Interventions    Readmission Risk Interventions 30 Day Unplanned Readmission Risk Score     ED to Hosp-Admission (Current) from 03/13/2018 in [facility]  30 Day Unplanned Readmission Risk Score (%)  63 Filed at 03/15/2018 1200     This score is the patient's risk of an unplanned readmission within 30 days of being discharged (0 -100%). The score is based on dignosis, age, lab data, medications, orders, and past utilization.   Low:  0-14.9   Medium: 15-21.9   High: 22-29.9   Extreme: 30 and above       Readmission Risk Prevention Plan 03/15/2018 03/15/2018  Transportation Screening - Complete  Medication Review Press [employer]) (No Data) -  PCP or Specialist appointment within 3-5 days of discharge - Complete  HRI or [place] - Not  Complete  HRI or Home Care Consult Pt Refusal Comments - Patient is resident at [facility], actively receiving physical therapy.  SW Recovery Care/Counseling Consult - Complete  Palliative Care Screening Complete Complete  Skilled Nursing Facility - Complete  Some recent data might be  hidden

## 2018-03-15 NOTE — [place] FL2 (Signed)
MEDICAID FL2 LEVEL OF CARE SCREENING TOOL     IDENTIFICATION  Patient Name: [name] Birthdate: 29-Mar-1950 Sex: female Admission Date (Current Location): 03/13/2018  [facility] and [place] Number:  Herbalist and Address:  [facility],  [place] [address] [place]      Provider Number: [id number]  Attending Physician Name and Address:  Flora Lipps, MD  Relative Name and Phone Number:       Current Level of Care: Hospital Recommended Level of Care: [place] Prior Approval Number:    Date Approved/Denied:   PASRR Number: [id number]  Discharge Plan: SNF    Current Diagnoses: Patient Active Problem List   Diagnosis Date Noted  . SIRS (systemic inflammatory response syndrome) ([place]) 03/13/2018  . Thrush 03/13/2018  . Dehydration 03/13/2018  . Venous stasis ulcer ([place]) 03/13/2018  . DNR (do not resuscitate)   . Palliative care by specialist   . Chronic pain syndrome   . Cellulitis of left foot 12/10/2017  . Weakness 12/10/2017  . Cellulitis 12/09/2017  . Right hemiplegia ([place]) 11/21/2017  . 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 Lung mass 10/04/2017  . Osteoarthritis of facet joint of lumbar spine 07/26/2017  . Low back pain radiating to both legs 07/26/2017  . Substance-induced psychotic disorder with delusions ([place]) 07/12/2017  . Adult failure to thrive 06/21/2017  . Pressure injury of skin 04/06/2017  . Pressure injury of skin of right ischial tuberosity region: Stage 2 04/06/2017  . Benzodiazepine dependence ([place])   . Narcotic addiction ([place]) 02/29/2016  . Abdominal pain, chronic, epigastric- due to chronic pancreatitis 02/29/2016  . COPD (chronic obstructive pulmonary disease) ([place]) 02/28/2016  . Cough 09/03/2015  . Abdominal pain 01/03/2015  . Hyponatremia 01/03/2015  . Hypocalcemia 01/03/2015  . Underweight 01/03/2015  . COPD GOLD III with reversible  component  07/17/2014  . Chronic alcoholic pancreatitis ([place]) 06/02/2013  . Recurrent acute pancreatitis 03/28/2013  . Alcohol withdrawal ([place]) 03/28/2013  . Pancreatitis 03/09/2013  . Tobacco abuse 03/09/2013  . Alcohol withdrawal syndrome with perceptual disturbance ([place]) 03/09/2013  . Benzodiazepine withdrawal ([place]) 02/09/2013  . Protein-calorie malnutrition, severe ([place]) 02/09/2013  . Hypokalemia 02/27/2011  . Abdominal pain, acute 02/25/2011  . Nausea and vomiting 02/25/2011  . Thrombocytopenia ([place]) 02/25/2011  . HTN (hypertension), malignant 02/25/2011  . Malignant neoplasm of kidney excluding renal pelvis ([place]) 10/18/2008  . Anxiety state 10/18/2008  . GERD 10/18/2008  . PANCREATITIS, CHRONIC- atrophic pancreas 10/18/2008  . DYSPNEA 10/18/2008  . GASTRIC ULCER, HX OF 10/18/2008    Orientation RESPIRATION BLADDER Height & Weight     Self,  Place  Normal Incontinent Weight: 93 lb (42.2 kg) Height:  5\' 5"  (165.1 cm)  BEHAVIORAL SYMPTOMS/MOOD NEUROLOGICAL BOWEL NUTRITION STATUS      Continent Diet(Soft Diet)  AMBULATORY STATUS COMMUNICATION OF NEEDS Skin   Extensive Assist Verbally Other (Comment)(Wound care to right hand skin tear:  Cleanse with NS, pat gently dry. Cover with folded piece of xeroform gauze [name] # [phone]), top with dry gauze 4x4 and secure with a few turns of kerlix roll gauze/paper tape. Change twice daily.)                       Personal Care Assistance Level of Assistance  Bathing, Feeding, Dressing Bathing Assistance: Maximum assistance Feeding assistance: Independent Dressing Assistance: Maximum assistance     Functional Limitations Info  Sight, Hearing, Speech Sight  Info: Adequate Hearing Info: Adequate Speech Info: Adequate    SPECIAL CARE FACTORS FREQUENCY  PT (By licensed PT), OT (By licensed OT)     PT Frequency: 5X OT Frequency: 5X            Contractures Contractures Info: Not present    Additional Factors Info  Code  Status, Allergies, Psychotropic Code Status Info: DNR Allergies Info: Allergies: Aspirin, Chlorpromazine Hcl Psychotropic Info: Remeron         Current Medications (03/15/2018):  This is the current hospital active medication list Current Facility-Administered Medications  Medication Dose Route Frequency Provider Last Rate Last Dose  . acetaminophen (TYLENOL) tablet 650 mg  650 mg Oral Q6H PRN Doutova, Anastassia, MD       Or  . acetaminophen (TYLENOL) suppository 650 mg  650 mg Rectal Q6H PRN Toy Baker, MD   650 mg at 03/14/18 1257  . albuterol (PROVENTIL) (2.5 MG/3ML) 0.083% nebulizer solution 2.5 mg  2.5 mg Nebulization Q2H PRN Doutova, Anastassia, MD      . bisacodyl (DULCOLAX) suppository 10 mg  10 mg Rectal Daily PRN Doutova, Anastassia, MD      . ceFEPIme (MAXIPIME) 2 g in sodium chloride 0.9 % 100 mL IVPB  2 g Intravenous Q12H Toy Baker, MD   Stopped at 03/15/18 0530  . Chlorhexidine Gluconate Cloth 2 % PADS 6 each  6 each Topical Q0600 Toy Baker, MD   6 each at 03/14/18 [phone]  . dexamethasone (DECADRON) tablet 4 mg  4 mg Oral Q8H Ladell Pier, MD   4 mg at 03/15/18 1413  . diazepam (VALIUM) tablet 5 mg  5 mg Oral Q8H PRN Doutova, Anastassia, MD      . docusate sodium (COLACE) capsule 100 mg  100 mg Oral BID Doutova, Anastassia, MD   100 mg at 03/15/18 0934  . famotidine (PEPCID) tablet 20 mg  20 mg Oral QHS Doutova, Anastassia, MD   20 mg at 03/14/18 2232  . feeding supplement (BOOST / RESOURCE BREEZE) liquid 1 Container  1 Container Oral BID BM Caren Griffins, MD      . fluconazole (DIFLUCAN) IVPB 200 mg  200 mg Intravenous Q24H Doutova, Anastassia, MD 100 mL/hr at 03/14/18 2225 200 mg at 03/14/18 2225  . gabapentin (NEURONTIN) capsule 300 mg  300 mg Oral QID Toy Baker, MD   300 mg at 03/15/18 1413  . guaiFENesin (MUCINEX) 12 hr tablet 600 mg  600 mg Oral BID Doutova, Anastassia, MD      . ipratropium-albuterol (DUONEB) 0.5-2.5 (3)  MG/3ML nebulizer solution 3 mL  3 mL Nebulization TID PRN Doutova, Anastassia, MD      . metroNIDAZOLE (FLAGYL) IVPB 500 mg  500 mg Intravenous Q8H Doutova, Anastassia, MD 100 mL/hr at 03/15/18 0931 500 mg at 03/15/18 0931  . mirtazapine (REMERON) tablet 45 mg  45 mg Oral QHS Toy Baker, MD   45 mg at 03/14/18 2233  . morphine 2 MG/ML injection 2 mg  2 mg Intravenous Q2H PRN Caren Griffins, MD      . mupirocin ointment (BACTROBAN) 2 % 1 application  1 application Nasal BID Toy Baker, MD   1 application at 40/98/11 1413  . nystatin (MYCOSTATIN) 100000 UNIT/ML suspension 500,000 Units  5 mL Mouth/Throat QID Doutova, Anastassia, MD      . ondansetron (ZOFRAN) tablet 4 mg  4 mg Oral Q6H PRN Doutova, Anastassia, MD       Or  . ondansetron (ZOFRAN) injection 4  mg  4 mg Intravenous Q6H PRN Toy Baker, MD   4 mg at 03/15/18 1143  . oxyCODONE (Oxy IR/ROXICODONE) immediate release tablet 10 mg  10 mg Oral QID PRN Toy Baker, MD   10 mg at 03/15/18 1420  . oxyCODONE (OXYCONTIN) 12 hr tablet 20 mg  20 mg Oral Q12H Doutova, Anastassia, MD   20 mg at 03/14/18 2232  . polyethylene glycol (MIRALAX / GLYCOLAX) packet 17 g  17 g Oral Daily PRN Toy Baker, MD      . Resource ThickenUp Clear   Oral PRN Caren Griffins, MD      . thiamine (VITAMIN B-1) tablet 100 mg  100 mg Oral Daily Pokhrel, [place], MD         Discharge Medications: Please see discharge summary for a list of discharge medications.  Relevant Imaging Results:  Relevant Lab Results:   Additional Information [id number]. Needs hospice to follow at facility  Lia Hopping, LCSW

## 2018-03-15 NOTE — Progress Notes (Addendum)
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 intake as well as progressive left-sided weakness.  She has known brain mets status post SRS, most recent MRI on 03/09/2018 showed substantially progressed vasogenic edema surrounding the treated right superior frontal gyrus lesion with mild associated mass-effect but no midline shift.  Chest x-ray during this admission concerning for left lung base pneumonia.  Subjective: Patient states that her shortness of breath is slightly better.  She does have a little less cough today.  Denies any chest pain, palpitation fever chills.  Denies nausea vomiting or diarrhea.  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 Questionable aspiration pneumonia.  on vancomycin, cefepime, metronidazole. Screening MRSA PCR was positive but patient is not clinically ill worsening so will discontinue vancomycin.Marland Kitchen  Respiratory viral panel was negative.  Blood cultures negative in 2 days.  Patient has been seen by speech therapy today with modified barium swallow and  recommend dysphagia 3 diet with thin liquids.  Max of 98.7 F within the last 24 hours.  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  On Decadron  every 8 hourly.  We will continue Decadron on discharge.  Left lung mass and CT-guided biopsy was done on 10/10/2017 showed metastatic adenocarcinoma.  Seen by oncology Dr. Learta Codding and radiation oncology during hospitalization.  Dysphagia failure to thrive resulting in severe protein calorie malnutrition, present on admission. Nutrition on board.  On boost breeze and Magic cup.  Severe protein calorie malnutrition -Nutrition consult  COPD exacerbation improving.  Continue steroids, nebulizer antibiotic.  Tobacco abuse Patient was counseled about it.  Not interested in quitting at this time.  Chronic pain/anxiety -Continue home medications.  Closely monitor.  Pressure injury of the right ischial tuberosity, stage II, present on admission. -Wound care to continue.  Oral thrush -Likely oral/esophageal candidiasis, Diflucan IV for now.  VTE Prophylaxis: SCD  Code Status: DNR  Family Communication: No one available at bedside  Disposition Plan: Skilled nursing facility likely tomorrow if patient remains afebrile, blood cultures are negative and continues to improve.  Hospice to be considered at the skilled nursing facility for condition deteriorates.  Follow-up at the cancer center on discharge.   Consultants:  Oncology Dr. Learta Codding, radiation oncology.  Procedures:  None  Antibiotics: Anti-infectives (From admission, onward)   Start     Dose/Rate Route Frequency Ordered Stop   03/15/18 0600  vancomycin (VANCOCIN) IVPB 1000 mg/200 mL premix  Status:  Discontinued     1,000 mg 200 mL/hr over 60 Minutes Intravenous  Every 36 hours 03/14/18 0509 03/15/18 1003   03/14/18 0600  ceFEPIme (MAXIPIME) 2 g in sodium chloride  0.9 % 100 mL IVPB     2 g 200 mL/hr over 30 Minutes Intravenous Every 12 hours 03/14/18 0509     03/14/18 0000  metroNIDAZOLE (FLAGYL) IVPB 500 mg     500 mg 100 mL/hr over 60 Minutes Intravenous Every 8 hours 03/13/18 2255     03/13/18 2130  fluconazole (DIFLUCAN) IVPB 200 mg     200 mg 100 mL/hr over 60 Minutes Intravenous Every 24 hours 03/13/18 2046     03/13/18 1645  ceFEPIme (MAXIPIME) 2 g in sodium chloride 0.9 % 100 mL IVPB     2 g 200 mL/hr over 30 Minutes Intravenous  Once 03/13/18 1634 03/13/18 1841   03/13/18 1645  metroNIDAZOLE (FLAGYL) IVPB 500 mg     500 mg 100 mL/hr over 60 Minutes Intravenous  Once 03/13/18 1634 03/13/18 1842   03/13/18 1645  vancomycin (VANCOCIN) IVPB 1000 mg/200 mL premix     1,000 mg 200 mL/hr over 60 Minutes Intravenous  Once 03/13/18 1634 03/13/18 2010      Objective: Vitals:   03/15/18 0642 03/15/18 1328  BP: 124/83 129/82  Pulse: 84 91  Resp: (!) 22 18  Temp: 97.7 F (36.5 C) 98.7 F (37.1 C)  SpO2: 100% 99%    Intake/Output Summary (Last 24 hours) at 03/15/2018 1448 Last data filed at 03/15/2018 7353 Gross per 24 hour  Intake 2379 ml  Output --  Net 2379 ml   Filed Weights   03/13/18 2301  Weight: 42.2 kg   Body mass index is 15.48 kg/m.   Physical Exam: GENERAL: Patient is alert awake and communicative.. Not in obvious distress.  Frail-appearing, HENT: No scleral pallor or icterus. Pupils equally reactive to light. Oral mucosa is moist whitish patch suggestive of thrush. NECK: is supple, no palpable thyroid enlargement. CHEST: Mild expiratory rhonchi.. Diminished breath sounds bilaterally. CVS: S1 and S2 heard, no murmur. Regular rate and rhythm. No pericardial rub. ABDOMEN: Soft, non-tender, bowel sounds are present. No palpable hepato-splenomegaly. EXTREMITIES: No edema. CNS: Cranial nerves are intact.  Generalized weakness noted.  Left sided weakness more than the right. SKIN: warm and dry without rashes.  Data  Review: I have personally reviewed the following laboratory data and studies,  CBC: Recent Labs  Lab 03/13/18 1643 03/13/18 1851 03/13/18 2002  WBC QUESTIONABLE RESULTS, RECOMMEND RECOLLECT TO VERIFY SPECIMEN CLOTTED 9.4  NEUTROABS SPECIMEN CLOTTED SPECIMEN CLOTTED 8.2*  HGB QUESTIONABLE RESULTS, RECOMMEND RECOLLECT TO VERIFY SPECIMEN CLOTTED 8.1*  HCT QUESTIONABLE RESULTS, RECOMMEND RECOLLECT TO VERIFY SPECIMEN CLOTTED 28.9*  MCV QUESTIONABLE RESULTS, RECOMMEND RECOLLECT TO VERIFY SPECIMEN CLOTTED 94.8  PLT QUESTIONABLE RESULTS, RECOMMEND RECOLLECT TO VERIFY SPECIMEN CLOTTED 299   Basic Metabolic Panel: Recent Labs  Lab 03/13/18 1643 03/15/18 0451  NA 140 140  K 3.9 3.8  CL 101 109  CO2 30 20*  GLUCOSE 95 188*  BUN 19 17  CREATININE 0.67 0.61  CALCIUM 8.9 7.9*  MG 1.9  --   PHOS 3.9  --    Liver Function Tests: Recent Labs  Lab 03/13/18 1643 03/15/18 0451  AST 15 28  ALT 36 38  ALKPHOS 88 65  BILITOT 0.7 0.3  PROT 6.5 4.7*  ALBUMIN 3.1* 2.2*   No results for input(s): LIPASE, AMYLASE in the last 168 hours. No results for input(s): AMMONIA in the last 168 hours. Cardiac Enzymes: No results for input(s):  CKTOTAL, CKMB, CKMBINDEX, TROPONINI in the last 168 hours. BNP (last 3 results) No results for input(s): BNP in the last 8760 hours.  ProBNP (last 3 results) No results for input(s): PROBNP in the last 8760 hours.  CBG: No results for input(s): GLUCAP in the last 168 hours. Recent Results (from the past 240 hour(s))  Blood Culture (routine x 2)     Status: None (Preliminary result)   Collection Time: 03/13/18  4:32 PM  Result Value Ref Range Status   Specimen Description   Final    BLOOD RIGHT ANTECUBITAL Performed at [facility], [place] [address], [place], [place] [address]    Special Requests   Final    BOTTLES DRAWN AEROBIC AND ANAEROBIC Blood Culture results may not be optimal due to an excessive volume of blood received in culture  bottles Performed at [facility] [address], [place], [place] [address]    Culture   Final    NO GROWTH 2 DAYS Performed at [facility] [address] [place], [place] [address]    Report Status PENDING  Incomplete  Respiratory Panel by PCR     Status: None   Collection Time: 03/13/18 10:45 PM  Result Value Ref Range Status   Adenovirus NOT DETECTED NOT DETECTED Final   Coronavirus 229E NOT DETECTED NOT DETECTED Final    Comment: (NOTE) The Coronavirus on the Respiratory Panel, DOES NOT test for the novel  Coronavirus (2019 nCoV)    Coronavirus HKU1 NOT DETECTED NOT DETECTED Final   Coronavirus NL63 NOT DETECTED NOT DETECTED Final   Coronavirus OC43 NOT DETECTED NOT DETECTED Final   Metapneumovirus NOT DETECTED NOT DETECTED Final   Rhinovirus / Enterovirus NOT DETECTED NOT DETECTED Final   Influenza A NOT DETECTED NOT DETECTED Final   Influenza B NOT DETECTED NOT DETECTED Final   Parainfluenza Virus 1 NOT DETECTED NOT DETECTED Final   Parainfluenza Virus 2 NOT DETECTED NOT DETECTED Final   Parainfluenza Virus 3 NOT DETECTED NOT DETECTED Final   Parainfluenza Virus 4 NOT DETECTED NOT DETECTED Final   Respiratory Syncytial Virus NOT DETECTED NOT DETECTED Final   Bordetella pertussis NOT DETECTED NOT DETECTED Final   Chlamydophila pneumoniae NOT DETECTED NOT DETECTED Final   Mycoplasma pneumoniae NOT DETECTED NOT DETECTED Final    Comment: Performed at [facility], [place]. [address] [place], [place] [address]  MRSA PCR Screening     Status: Abnormal   Collection Time: 03/14/18 12:00 AM  Result Value Ref Range Status   MRSA by PCR POSITIVE (A) NEGATIVE Final    Comment:        The GeneXpert MRSA Assay (FDA approved for NASAL specimens only), is one component of a comprehensive MRSA colonization surveillance program. It is not intended to diagnose MRSA infection nor to guide or monitor treatment for MRSA infections. CRITICAL RESULT  CALLED TO, READ BACK BY AND VERIFIED WITH:  RN patram l. at 0502 03/14/18 [place] a Performed at [facility], [place] [address] [place], [place] [address]      Studies: Abd 1 View (kub)  Result Date: 03/13/2018 CLINICAL DATA:  Abdominal distension. EXAM: ABDOMEN - 1 VIEW COMPARISON:  CT abdomen and pelvis February 06, 2018 FINDINGS: Bowel gas pattern is nondilated and nonobstructive. Advanced vascular calcifications. Surgical staples LEFT upper quadrant. Surgical clips project RIGHT pelvis. No intra-abdominal mass effect, pathologic calcifications or free air. Rounded calcification LEFT mid abdomen corresponding to renal lesion on  prior CT. Soft tissue planes and included osseous structures are nonsuspicious. LEFT femur pinning. IMPRESSION: Nonspecific bowel gas pattern. Aortic Atherosclerosis (ICD10-I70.0). Electronically Signed   By: Elon Alas M.D.   On: 03/13/2018 23:25   Dg Chest Port 1 View  Result Date: 03/13/2018 CLINICAL DATA:  68 y/o F; sepsis. History of metastatic adenocarcinoma of unknown primary. EXAM: PORTABLE CHEST 1 VIEW COMPARISON:  02/06/2018 CT chest. FINDINGS: The heart size and mediastinal contours are within normal limits. Aortic calcific atherosclerosis. Reticulonodular opacities in the lung bases compatible with bronchitis/bronchiolitis similar to prior CT of chest. Left basilar consolidation appears improved. No new consolidation, effusion, or pneumothorax. No acute osseous abnormality is evident. IMPRESSION: Reticulonodular opacities in the lung bases compatible with bronchitis/bronchiolitis similar to prior CT of chest. Left basilar consolidation appears improved given differences in technique. No new consolidation. Electronically Signed   By: Kristine Garbe M.D.   On: 03/13/2018 17:14   Dg Swallowing Func-speech Pathology  Result Date: 03/15/2018 Objective Swallowing Evaluation: Type of Study: Bedside Swallow Evaluation  Patient Details  Name: [name] MRN: [id number] Date of Birth: [birth year] Today's Date: 03/15/2018 Time: SLP Start Time (ACUTE ONLY): 7026 -SLP Stop Time (ACUTE ONLY): 0840 SLP Time Calculation (min) (ACUTE ONLY): 26 min Past Medical History: Past Medical History: Diagnosis Date  Cancer ([place])   renal ca  COPD (chronic obstructive pulmonary disease) ([place])   Drug-seeking behavior   Pancreatitis   Pancreatitis   Seizures ([place])   Substance abuse ([place])  Past Surgical History: Past Surgical History: Procedure Laterality Date  HIP SURGERY Bilateral   IR GENERIC HISTORICAL  05/08/2015  IR RADIOLOGIST EVAL & MGMT 05/08/2015 Aletta Edouard, MD [facility] INTERV RAD  IR GENERIC HISTORICAL  01/08/2014  IR RADIOLOGIST EVAL & MGMT 01/08/2014 Aletta Edouard, MD [facility] INTERV RAD  IR RADIOLOGY PERIPHERAL GUIDED IV START  10/03/2017  IR RADIOLOGY PERIPHERAL GUIDED IV START  10/10/2017  IR US GUIDE VASC ACCESS LEFT  02/06/2018  IR US GUIDE [place] RIGHT  10/05/2017  IR US GUIDE VASC ACCESS RIGHT  10/10/2017  IR VENIPUNCTURE 38YRS/OLDER BY MD  02/06/2018  KIDNEY SURGERY    removed cancerous lesions  PARTIAL GASTRECTOMY   HPI: 68 yo female adm to [facility] with SIRS/dehydration/shortness of breath.  pt has h/o metastatic cancer to brain and lung, remote h/o ETOH/tobacco use, COPD, h/o chronic pain, pancreatitis, remote left renal carcinoma 2009, partial gastrectomy, anemia.  Pt found to have thrush and is on Diflucan.  Imaging study from February 2020 concerning for infectious process in lung.  Swallow eval ordered as pt overtly choked on liquids.  Subjective: pt awake in chair Assessment / Plan / Recommendation CHL IP CLINICAL IMPRESSIONS 03/15/2018 Clinical Impression Patient presents with surprisingly functional oropharyngeal swallow mild deficits.  Mild delay with premature oral spillage noted with delay in pharyngeal swallow due to decreased sensation.  Pt swallow initiation delays in pharynx up to 6 seconds spilling to pyriform sinus with all  consistencies - which will allow episodic aspiration.  Pt did not transit tablet with puree as it separated - thus continue medications crushed.  Pt did fortunately cough during testing without aspiration/penetration.  Recommend pt sit FULLY upright and only eat when not dyspneic.  Will follow up for education and compensation with pt/family.  Using teach back with live monitor, pt educated to findings. l SLP Visit Diagnosis Dysphagia, oropharyngeal phase (R13.12) Attention and concentration deficit following -- Frontal lobe and executive function deficit following -- Impact on safety and  function Mild aspiration risk   CHL IP TREATMENT RECOMMENDATION 03/15/2018 Treatment Recommendations Therapy as outlined in treatment plan below   Prognosis 03/15/2018 Prognosis for Safe Diet Advancement Guarded Barriers to Reach Goals Cognitive deficits;Behavior;Severity of deficits Barriers/Prognosis Comment -- CHL IP DIET RECOMMENDATION 03/15/2018 SLP Diet Recommendations Dysphagia 3 (Mech soft) solids;Thin liquid Liquid Administration via Cup;Straw Medication Administration Crushed with puree Compensations Minimize environmental distractions;Slow rate;Small sips/bites Postural Changes Remain semi-upright after after feeds/meals (Comment);Seated upright at 90 degrees   CHL IP OTHER RECOMMENDATIONS 03/15/2018 Recommended Consults -- Oral Care Recommendations Oral care QID Other Recommendations --   CHL IP FOLLOW UP RECOMMENDATIONS 03/14/2018 Follow up Recommendations (No Data)   CHL IP FREQUENCY AND DURATION 03/15/2018 Speech Therapy Frequency (ACUTE ONLY) min 1 x/week Treatment Duration 1 week      CHL IP ORAL PHASE 03/15/2018 Oral Phase Impaired Oral - Pudding Teaspoon -- Oral - Pudding Cup -- Oral - Honey Teaspoon -- Oral - Honey Cup -- Oral - Nectar Teaspoon -- Oral - Nectar Cup Premature spillage Oral - Nectar Straw -- Oral - Thin Teaspoon -- Oral - Thin Cup Premature spillage Oral - Thin Straw Premature spillage Oral - Puree  Premature spillage;Weak lingual manipulation;Piecemeal swallowing Oral - Mech Soft Premature spillage;Weak lingual manipulation;Impaired mastication;Piecemeal swallowing Oral - Regular -- Oral - Multi-Consistency -- Oral - Pill Premature spillage;Weak lingual manipulation Oral Phase - Comment --  CHL IP PHARYNGEAL PHASE 03/15/2018 Pharyngeal Phase Impaired Pharyngeal- Pudding Teaspoon -- Pharyngeal -- Pharyngeal- Pudding Cup -- Pharyngeal -- Pharyngeal- Honey Teaspoon -- Pharyngeal -- Pharyngeal- Honey Cup -- Pharyngeal -- Pharyngeal- Nectar Teaspoon -- Pharyngeal -- Pharyngeal- Nectar Cup Delayed swallow initiation-vallecula;Delayed swallow initiation-pyriform sinuses Pharyngeal -- Pharyngeal- Nectar Straw -- Pharyngeal -- Pharyngeal- Thin Teaspoon -- Pharyngeal -- Pharyngeal- Thin Cup Delayed swallow initiation-vallecula;Delayed swallow initiation-pyriform sinuses Pharyngeal -- Pharyngeal- Thin Straw Delayed swallow initiation-vallecula;Delayed swallow initiation-pyriform sinuses Pharyngeal -- Pharyngeal- Puree Delayed swallow initiation-vallecula;Delayed swallow initiation-pyriform sinuses Pharyngeal -- Pharyngeal- Mechanical Soft Delayed swallow initiation-vallecula;Delayed swallow initiation-pyriform sinuses Pharyngeal -- Pharyngeal- Regular -- Pharyngeal -- Pharyngeal- Multi-consistency -- Pharyngeal -- Pharyngeal- Pill NT Pharyngeal -- Pharyngeal Comment --  CHL IP CERVICAL ESOPHAGEAL PHASE 03/15/2018 Cervical Esophageal Phase WFL Pudding Teaspoon -- Pudding Cup -- Honey Teaspoon -- Honey Cup -- Nectar Teaspoon -- Nectar Cup -- Nectar Straw -- Thin Teaspoon -- Thin Cup -- Thin Straw -- Puree -- Mechanical Soft -- Regular -- Multi-consistency -- Pill -- Cervical Esophageal Comment -- Upon esophageal sweep, pt appeared clear in esophagus.  Luanna Salk, MS [facility] SLP Acute Rehab Services Pager [phone] Office [phone] Macario Golds 03/15/2018, 9:03 AM               Scheduled Meds:  Chlorhexidine  Gluconate Cloth  6 each Topical Q0600   dexamethasone  4 mg Oral Q8H   docusate sodium  100 mg Oral BID   famotidine  20 mg Oral QHS   feeding supplement  1 Container Oral BID BM   gabapentin  300 mg Oral QID   guaiFENesin  600 mg Oral BID   mirtazapine  45 mg Oral QHS   mupirocin ointment  1 application Nasal BID   nystatin  5 mL Mouth/Throat QID   oxyCODONE  20 mg Oral Q12H   thiamine  100 mg Oral Daily    Continuous Infusions:  ceFEPime (MAXIPIME) IV Stopped (03/15/18 0530)   fluconazole (DIFLUCAN) IV 200 mg (03/14/18 2225)   metronidazole 500 mg (03/15/18 0931)     Flora Lipps, MD  [facility] 03/15/2018

## 2018-03-15 NOTE — Progress Notes (Signed)
   03/15/18 1526  Clinical Encounter Type  Visited With Patient  Visit Type Initial  Referral From Chaplain  Consult/Referral To Chaplain  The chaplain attempted initial spiritual care visit of PMT Pt.  The Pt. was sleeping and was not aroused by the chaplain calling her name. The chaplain will F/U with spiritual care visit as needed.

## 2018-03-15 NOTE — Progress Notes (Signed)
MBS completed - Full report to follow. Pt without aspiration or penetration - Pt does have delay in pharyngeal swallow which could lead to aspiration as boluses spill from vallecular region to pyriform sinus.  Advised her to follow strict aspiration precautions and assure not consuming po when SOB due to significantly increased aspiration risk with delay.  Advanced diet to dys3/thin - Pt did not transit tablet whole with puree- thus meds crushed indicated. Luanna Salk, [facility] SLP Acute Rehab Services Pager [phone] Office [phone]

## 2018-03-15 NOTE — Progress Notes (Signed)
Lab was unable to draw blood this morning. Phlebotomist at bedside to reattempt lab draw, pt refuses to be stuck. MD aware.   Avante Carneiro, Bing Neighbors, RN

## 2018-03-16 DIAGNOSIS — E860 Dehydration: Secondary | ICD-10-CM | POA: Diagnosis not present

## 2018-03-16 DIAGNOSIS — J449 Chronic obstructive pulmonary disease, unspecified: Secondary | ICD-10-CM | POA: Diagnosis not present

## 2018-03-16 DIAGNOSIS — Z515 Encounter for palliative care: Secondary | ICD-10-CM | POA: Diagnosis not present

## 2018-03-16 DIAGNOSIS — Z7189 Other specified counseling: Secondary | ICD-10-CM

## 2018-03-16 DIAGNOSIS — R918 Other nonspecific abnormal finding of lung field: Secondary | ICD-10-CM | POA: Diagnosis not present

## 2018-03-16 DIAGNOSIS — G894 Chronic pain syndrome: Secondary | ICD-10-CM | POA: Diagnosis not present

## 2018-03-16 DIAGNOSIS — K219 Gastro-esophageal reflux disease without esophagitis: Secondary | ICD-10-CM | POA: Diagnosis not present

## 2018-03-16 MED ORDER — DOCUSATE SODIUM 100 MG PO CAPS: 100.0000 mg | ORAL_CAPSULE | Freq: Two times a day (BID) | ORAL | 0 refills | Status: AC

## 2018-03-16 MED ORDER — FLUCONAZOLE 100 MG PO TABS: 100.0000 mg | ORAL_TABLET | Freq: Every day | ORAL | 0 refills | Status: AC

## 2018-03-16 MED ORDER — NYSTATIN 100000 UNIT/ML MT SUSP: 5.0000 mL | Freq: Four times a day (QID) | OROMUCOSAL | 0 refills | Status: AC

## 2018-03-16 MED ORDER — THIAMINE HCL 100 MG PO TABS: 100.0000 mg | ORAL_TABLET | Freq: Every day | ORAL | 0 refills | Status: AC

## 2018-03-16 MED ORDER — AMOXICILLIN-POT CLAVULANATE 875-125 MG PO TABS: 1.0000 | ORAL_TABLET | Freq: Two times a day (BID) | ORAL | 0 refills | Status: AC

## 2018-03-16 MED ORDER — DEXAMETHASONE 4 MG PO TABS: ORAL_TABLET | ORAL | 0 refills | Status: AC

## 2018-03-16 NOTE — TOC Transition Note (Signed)
Transition of Care [facility]) - CM/SW Discharge Note   Patient Details  Name: [name] MRN: [id number] Date of Birth: 07-18-1950  [facility]) CM/SW Contact:  Lia Hopping, [place] Phone Number: 03/16/2018, 2:26 PM   Clinical Narrative:    Patient returning to [facility].  Spouse to transport.  DNR complete.  No oxygen per nurse.  Nurse call report to: [phone] Room 109B.    Final next level of care: Skilled Nursing Facility Barriers to Discharge: No Barriers Identified   Patient Goals and CMS Choice Patient states their goals for this hospitalization and ongoing recovery are:: Return to SNF    Choice offered to / list presented to : NA  Discharge Placement  [place] number confirmed : 03/15/18            Patient to be transferred to facility by: Spouse to Transport Name of family member notified: Spouse  Patient and family notified of of transfer: 03/16/18  Discharge Plan and Services   Post Acute Care Choice: Nursing Home                    Social Determinants of Health (SDOH) Interventions     Readmission Risk Interventions Readmission Risk Prevention Plan 03/15/2018 03/15/2018  Transportation Screening - Complete  Medication Review [employer]) (No Data) -  PCP or Specialist appointment within 3-5 days of discharge - Complete  HRI or [place] - Not Complete  HRI or Home Care Consult Pt Refusal Comments - Patient is resident at [facility], actively receiving physical therapy.  SW Recovery Care/Counseling Consult - Complete  Palliative Care Screening Complete Complete  Skilled Nursing Facility - Complete  Some recent data might be hidden

## 2018-03-16 NOTE — Progress Notes (Signed)
Palliative care brief note  Palliative consult received.    Met with patient and discussed with her husband via phone.  They have been following with radiation oncology as well as Dr. Benay Spice.  We discussed options moving forward and she is not interested in hospice services at this point.  Recommend f/u with radiation oncology and Dr. Benay Spice as outpatient.  Full note to follow.  Micheline Rough, MD [place] Team [phone]

## 2018-03-16 NOTE — Discharge Summary (Signed)
Physician Discharge Summary  [name] ZOX:[id number] DOB: 1950-11-22 DOA: 03/13/2018  PCP: Azzie Glatter, FNP  Admit date: 03/13/2018 Discharge date: 03/16/2018  Admitted From: Skilled nursing facility  Discharge disposition: Skilled nursing facility   Recommendations for Outpatient Follow-Up:    Follow up with your primary care provider in 5 days, Dr. 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  Diet recommendation: Dysphagia 3 (mechanical soft), nectar thick liquids. No straw Medication Administration: Whole meds with puree Supervision: Patient able to self feed Compensations: Small sips/bites;Slow rate Postural Changes and/or Swallow Maneuvers: Seated upright 90 degrees;Upright 30-60 min after meal(90 degrees upright!!)  Wound care: None.  Code status: DNR   History of Present Illness:   68 year old female with history of metastatic cancer to the brain and lung, remote history of alcohol and tobacco use, COPD, history of pancreatitis, chronic pain, remote renal cell carcinoma in 2009, partial gastrectomy, anemia who came into the hospital with URI type symptoms, cough, fever, up to 101 and decreased p.o. intake as well as progressive left-sided weakness. She has known brain mets status post XRT, most recent MRI on 03/09/2018 showed substantially progressed vasogenic edema surrounding the treated right superior frontal gyrus lesion with mild  associated mass-effect but no midline shift. Chest x-ray during this admission concerning for left lung base pneumonia.   Hospital Course:   Sepsis due to left lung base lobar pneumonia with underlying dysphagia. Possible aspiration pneumonia.   Patient received a broad-spectrum antibiotic with cefepime metronidazole. Respiratory viral panel was negative. Blood cultures negative in 3 days.  Patient has been seen by speech therapy today with modified barium swallow and recommend dysphagia 3 diet with thin liquids.  Max of 98. F within the last 24 hours.  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  On Decadron  every 8 hourly as per radiation oncology recommendation.  Please follow the taper dose as in the discharge instruction. .  Left lung mass CT-guided biopsy was done on 10/10/2017 showed metastatic adenocarcinoma.  Seen by oncology Dr. Learta Codding and radiation oncology during hospitalization.  Patient will need to follow-up with Dr. Learta Codding oncology and radiation oncology as outpatient.  Dysphagia failure to thrive resulting in severe protein calorie malnutrition, present on admission. Nutrition on board.  Patient received supplemental nutrition while in the hospital.  Continue on discharge.  COPD exacerbation Improved.  Continue antibiotics steroids and inhalers on discharge.  Tobacco abuse Patient was counseled about it.  Not interested in quitting at this time.  Chronic pain/anxiety -Continue home medications.  Closely monitor.  Pressure injury of the right ischial tuberosity, stage II, present on admission. -Wound care to continue.  Oral thrush -Likely oral/esophageal candidiasis,  patient received a nystatin and Diflucan.  We will continue Diflucan to complete the course.  Disposition.  I had a prolonged discussion with the patient's husband at bedside.  At this  time patient is stable for disposition to skilled nursing facility.  I have encouraged the patient's husband to discuss about goals of care at the skilled nursing facility if her condition deteriorates again.  He has declined hospice consideration at this time.   Medical Consultants:    Radiation oncology, medical oncology   Subjective:   Today, patient feels good.  Wants to get discharged.  Noncompliant with dietary regimen.  Denies any increasing shortness of breath, cough or fever.  Discharge Exam:   Vitals:   03/16/18 0352 03/16/18 0940  BP: (!) 195/107 (!) 158/106  Pulse: 79 (!) 124  Resp: 20   Temp: 97.6 F (36.4 C) (!) 96.4 F (35.8 C)  SpO2: 99% 97%   Vitals:   03/15/18 1328 03/15/18 2058 03/16/18 0352 03/16/18 0940  BP: 129/82 (!) 162/92 (!) 195/107 (!) 158/106  Pulse: 91 85 79 (!) 124  Resp: 18 18 20    Temp: 98.7 F (37.1 C) 97.7 F (36.5 C) 97.6 F (36.4 C) (!) 96.4 F (35.8 C)  TempSrc: Oral Oral Oral Axillary  SpO2: 99% 99% 99% 97%  Weight:      Height:        General exam: Appears calm and comfortable ,Not in distress, cachectic.  On nasal cannula. HEENT:PERRL,Oral mucosa moist Respiratory system: Diminished breath sounds bilaterally.  Cardiovascular system: S1 & S2 heard, RRR.  Gastrointestinal system: Abdomen is nondistended, soft and nontender. No organomegaly or masses felt. Normal bowel sounds heard. Central nervous system: Alert and oriented.   Generalized weakness mostly on the left side noted. Extremities: No edema, no clubbing ,no cyanosis, distal peripheral pulses palpable. Skin: No rashes, lesions or ulcers,no icterus.  Mild pallor noted MSK: Normal muscle bulk,tone ,power    Procedures:    None  The results of significant diagnostics from this hospitalization (including imaging, microbiology, ancillary and laboratory) are listed below for reference.     Diagnostic Studies:   Abd 1 View (kub)  Result Date: 03/13/2018 CLINICAL  DATA:  Abdominal distension. EXAM: ABDOMEN - 1 VIEW COMPARISON:  CT abdomen and pelvis February 06, 2018 FINDINGS: Bowel gas pattern is nondilated and nonobstructive. Advanced vascular calcifications. Surgical staples LEFT upper quadrant. Surgical clips project RIGHT pelvis. No intra-abdominal mass effect, pathologic calcifications or free air. Rounded calcification LEFT mid abdomen corresponding to renal lesion on prior CT. Soft tissue planes and included osseous structures are nonsuspicious. LEFT femur pinning. IMPRESSION: Nonspecific bowel gas pattern. Aortic Atherosclerosis (ICD10-I70.0). Electronically Signed   By: Elon Alas M.D.   On: 03/13/2018 23:25   Dg Chest Port 1 View  Result Date: 03/13/2018 CLINICAL DATA:  68 y/o F; sepsis. History of metastatic adenocarcinoma of unknown primary. EXAM: PORTABLE CHEST 1 VIEW COMPARISON:  02/06/2018 CT chest. FINDINGS: The heart size and mediastinal contours are within normal limits. Aortic calcific atherosclerosis. Reticulonodular opacities in the lung bases compatible with bronchitis/bronchiolitis similar to prior CT of chest. Left basilar consolidation appears improved. No new consolidation, effusion, or pneumothorax. No acute osseous abnormality is evident. IMPRESSION: Reticulonodular opacities in the lung bases compatible with bronchitis/bronchiolitis similar to prior CT of chest. Left basilar consolidation appears improved given differences in technique. No new consolidation. Electronically Signed   By: Kristine Garbe M.D.   On: 03/13/2018 17:14     Labs:   Basic Metabolic Panel: Recent Labs  Lab 03/13/18 1643 03/15/18 0451  NA 140 140  K 3.9 3.8  CL 101 109  CO2 30 20*  GLUCOSE 95 188*  BUN  19 17  CREATININE 0.67 0.61  CALCIUM 8.9 7.9*  MG 1.9  --   PHOS 3.9  --    GFR Estimated Creatinine Clearance: 46.7 mL/min (by C-G formula based on SCr of 0.61 mg/dL). Liver Function Tests: Recent Labs  Lab 03/13/18 1643 03/15/18  0451  AST 15 28  ALT 36 38  ALKPHOS 88 65  BILITOT 0.7 0.3  PROT 6.5 4.7*  ALBUMIN 3.1* 2.2*   No results for input(s): LIPASE, AMYLASE in the last 168 hours. No results for input(s): AMMONIA in the last 168 hours. Coagulation profile No results for input(s): INR, PROTIME in the last 168 hours.  CBC: Recent Labs  Lab 03/13/18 1643 03/13/18 1851 03/13/18 2002  WBC QUESTIONABLE RESULTS, RECOMMEND RECOLLECT TO VERIFY SPECIMEN CLOTTED 9.4  NEUTROABS SPECIMEN CLOTTED SPECIMEN CLOTTED 8.2*  HGB QUESTIONABLE RESULTS, RECOMMEND RECOLLECT TO VERIFY SPECIMEN CLOTTED 8.1*  HCT QUESTIONABLE RESULTS, RECOMMEND RECOLLECT TO VERIFY SPECIMEN CLOTTED 28.9*  MCV QUESTIONABLE RESULTS, RECOMMEND RECOLLECT TO VERIFY SPECIMEN CLOTTED 94.8  PLT QUESTIONABLE RESULTS, RECOMMEND RECOLLECT TO VERIFY SPECIMEN CLOTTED 202   Cardiac Enzymes: No results for input(s): CKTOTAL, CKMB, CKMBINDEX, TROPONINI in the last 168 hours. BNP: Invalid input(s): POCBNP CBG: No results for input(s): GLUCAP in the last 168 hours. D-Dimer No results for input(s): DDIMER in the last 72 hours. Hgb A1c No results for input(s): HGBA1C in the last 72 hours. Lipid Profile No results for input(s): CHOL, HDL, LDLCALC, TRIG, CHOLHDL, LDLDIRECT in the last 72 hours. Thyroid function studies No results for input(s): TSH, T4TOTAL, T3FREE, THYROIDAB in the last 72 hours.  Invalid input(s): FREET3 Anemia work up No results for input(s): VITAMINB12, FOLATE, FERRITIN, TIBC, IRON, RETICCTPCT in the last 72 hours. Microbiology Recent Results (from the past 240 hour(s))  Blood Culture (routine x 2)     Status: None (Preliminary result)   Collection Time: 03/13/18  4:32 PM  Result Value Ref Range Status   Specimen Description   Final    BLOOD RIGHT ANTECUBITAL Performed at [facility], [place] [address], [place], [place] [address]    Special Requests   Final    BOTTLES DRAWN AEROBIC AND ANAEROBIC Blood Culture results may not  be optimal due to an excessive volume of blood received in culture bottles Performed at [facility] [address], [place], [place] [address]    Culture   Final    NO GROWTH 3 DAYS Performed at [facility], [place] [address] [place],  [address]    Report Status PENDING  Incomplete  Respiratory Panel by PCR     Status: None   Collection Time: 03/13/18 10:45 PM  Result Value Ref Range Status   Adenovirus NOT DETECTED NOT DETECTED Final   Coronavirus 229E NOT DETECTED NOT DETECTED Final    Comment: (NOTE) The Coronavirus on the Respiratory Panel, DOES NOT test for the novel  Coronavirus (2019 nCoV)    Coronavirus HKU1 NOT DETECTED NOT DETECTED Final   Coronavirus NL63 NOT DETECTED NOT DETECTED Final   Coronavirus OC43 NOT DETECTED NOT DETECTED Final   Metapneumovirus NOT DETECTED NOT DETECTED Final   Rhinovirus / Enterovirus NOT DETECTED NOT DETECTED Final   Influenza A NOT DETECTED NOT DETECTED Final   Influenza B NOT DETECTED NOT DETECTED Final   Parainfluenza Virus 1 NOT DETECTED NOT DETECTED Final   Parainfluenza Virus 2 NOT DETECTED NOT DETECTED Final   Parainfluenza Virus 3 NOT DETECTED NOT DETECTED Final   Parainfluenza Virus  4 NOT DETECTED NOT DETECTED Final   Respiratory Syncytial Virus NOT DETECTED NOT DETECTED Final   Bordetella pertussis NOT DETECTED NOT DETECTED Final   Chlamydophila pneumoniae NOT DETECTED NOT DETECTED Final   Mycoplasma pneumoniae NOT DETECTED NOT DETECTED Final    Comment: Performed at [facility], [place] [address], [place], [place] [address]  MRSA PCR Screening     Status: Abnormal   Collection Time: 03/14/18 12:00 AM  Result Value Ref Range Status   MRSA by PCR POSITIVE (A) NEGATIVE Final    Comment:        The GeneXpert MRSA Assay (FDA approved for NASAL specimens only), is one component of a comprehensive MRSA colonization surveillance program. It is not intended to diagnose MRSA infection nor to  guide or monitor treatment for MRSA infections. CRITICAL RESULT CALLED TO, READ BACK BY AND VERIFIED WITH:  RN patram l. at 0502 03/14/18 [place] a Performed at [facility], [place] [address], [place],  [address]      Discharge Instructions:   Discharge Instructions    Diet - low sodium heart healthy   Complete by:  As directed    Discharge instructions   Complete by:  As directed    Follow-up with primary care provider in 3 to 5 days.  Follow-up with Dr Learta Codding oncology in 1 to 2 weeks radiation oncology in 1 to 2 weeks.  Continue to take antibiotics and Decadron.   Increase activity slowly   Complete by:  As directed      Allergies as of 03/16/2018      Reactions   Aspirin Other (See Comments)   Break out in welts   Chlorpromazine Hcl Other (See Comments)   Muscle spasms - "really bad"      Medication List    STOP taking these medications   LORazepam 1 MG tablet Commonly known as:  ATIVAN     TAKE these medications   amoxicillin-clavulanate 875-125 MG tablet Commonly known as:  Augmentin Take 1 tablet by mouth 2 (two) times daily for 3 days.   CHLORHEXIDINE GLUCONATE (BULK) Soln Take by mouth daily as needed. Topically to mouth daily as needed for thrush   dexamethasone 4 MG tablet Commonly known as:  DECADRON 1 tab po TID x 5 days then  1 tab PO BID What changed:    medication strength  how much to take  how to take this  when to take this  additional instructions   diazepam 5 MG tablet Commonly known as:  VALIUM Take 1 tablet (5 mg total) by mouth every 6 (six) hours as needed for anxiety.   docusate sodium 100 MG capsule Commonly known as:  COLACE Take 1 capsule (100 mg total) by mouth 2 (two) times daily.   famotidine 20 MG tablet Commonly known as:  PEPCID Take 1 tablet (20 mg total) by mouth daily. What changed:  when to take this   feeding supplement (ENSURE ENLIVE) Liqd Take 237 mLs by mouth 3 (three) times  daily between meals.   fluconazole 100 MG tablet Commonly known as:  Diflucan Take 1 tablet (100 mg total) by mouth daily for 10 days.   gabapentin 300 MG capsule Commonly known as:  NEURONTIN Take 300 mg by mouth 4 (four) times daily.   guaiFENesin 100 MG/5ML liquid Commonly known as:  ROBITUSSIN Take 15 mLs by mouth every 6 (six) hours as needed for cough.   ipratropium-albuterol 0.5-2.5 (3) MG/3ML Soln Commonly known  as:  DUONEB Take 3 mLs by nebulization every 6 (six) hours as needed. Use 3 times daily x 4 days, then every 6 hours as needed. What changed:    when to take this  reasons to take this  additional instructions   MIRALAX PO Take 17 g by mouth daily.   mirtazapine 45 MG tablet Commonly known as:  REMERON Take 45 mg by mouth at bedtime.   neomycin-bacitracin-polymyxin ointment Commonly known as:  NEOSPORIN Apply 1 application topically daily. To left arm for skin tear   nystatin 100000 UNIT/ML suspension Commonly known as:  MYCOSTATIN Use as directed 5 mLs (500,000 Units total) in the mouth or throat 4 (four) times daily.   ondansetron 4 MG tablet Commonly known as:  ZOFRAN Take 4 mg by mouth every 6 (six) hours as needed for nausea.   Oxycodone HCl 10 MG Tabs Take 1 tablet (10 mg total) by mouth 4 (four) times daily as needed (pain).   oxyCODONE 20 mg 12 hr tablet Commonly known as:  OXYCONTIN Take 1 tablet (20 mg total) by mouth every 12 (twelve) hours.   ProAir HFA 108 (90 Base) MCG/ACT inhaler Generic drug:  albuterol Inhale 1 puff into the lungs every 4 (four) hours as needed for wheezing or shortness of breath.   promethazine 12.5 MG tablet Commonly known as:  PHENERGAN Take 12.5 mg by mouth every 8 (eight) hours as needed for nausea or vomiting.   thiamine 100 MG tablet Take 1 tablet (100 mg total) by mouth daily. Start taking on:  March 17, 2018   tiotropium 18 MCG inhalation capsule Commonly known as:  Spiriva Handi[address]er PLACE 1  CAPSULE INTO INHALER AND INHALE DAILY         Time coordinating discharge: 39 minutes  Signed:  Delene Morais  [facility] 03/16/2018, 10:42 AM

## 2018-03-16 NOTE — Progress Notes (Signed)
SATURATION QUALIFICATIONS: (This note is used to comply with regulatory documentation for home oxygen)  Patient Saturations on Room Air at Rest =91 %  Patient Saturations on Room Air while Ambulating = 92%  Patient Saturations on 2 Liters at rest =95%  Please briefly explain why patient needs home oxygen:

## 2018-03-16 NOTE — Progress Notes (Signed)
Pt discharged back to [facility]. Report called to nurse Elmyra Ricks. Pt very confused and weak with stable vital signs upon leaving. I informed receiving nurse of the new onset of hallucinations, she stated this is not baseline for pt. Receiving nurse stated that cognition does wax and wane historically speaking, denies hx of hallucinations. Nurse Elmyra Ricks is going to speak with Hospice case manager about the possible need for inpatient hospice. While giving report, Elmyra Ricks stated that pt had arrived safely to facility with husband.

## 2018-03-16 NOTE — Consult Note (Signed)
Palliative care consult note  Reason for consult: Goals of care in light of metastatic disease and declining functional status  Palliative medicine consult received.  Chart reviewed including personal review of pertinent labs and imaging.  Discussed with bedside RN as well as speech therapy.  I met today with [name]. We discussed clinical course as well as wishes moving forward in regard to advanced directives.  Concepts specific to code status and rehospitalization discussed.  We discussed difference between a aggressive medical intervention path and a palliative, comfort focused care path.  Values and goals of care important to patient and family were attempted to be elicited.  Both patient and her husband reports being aware that she has life limiting illness in the form of metastatic cancer.  We discussed her goals of continuing to live as long as possible for as well as possible.  We talked about how at times this involves continuation of disease modifying therapy including chemotherapy, radiation, or surgical interventions.  We also discussed that if there comes a point in time with these are not likely to continue to add as much time in quality to her life as possible, she would best be served by focusing on aggressive symptom management through assistance of organization such as hospice.  At this point in time she reports that she would like to continue with current interventions and is not interested in further discussion regarding hospice support.  Questions and concerns addressed.   PMT will continue to support holistically.  - Plan for follow-up with radiation oncology as well as Dr. Benay Spice as outpatient.  I would recommend palliative care to continue to follow her as an outpatient at skilled facility.  Total time: 60 minutes Greater than 50%  of this time was spent counseling and coordinating care related to the above assessment and plan.  Janice Rough, MD [facility] [phone]

## 2018-03-17 ENCOUNTER — Telehealth: Payer: Self-pay | Admitting: Radiation Therapy

## 2018-03-17 NOTE — Telephone Encounter (Signed)
I spoke with Ms. Congas' nurse at [facility], [place], to verify their instructions for her steroid taper.   Their discharge orders indicate that Ms. Cedar is to take 4mg  TID until 3/18. She will begin 4mg  BID on 3/18 until further instruction.   Our plan is to check back in on 3/23 to see how she is doing and if further taper is indicated.   Mont Dutton R.T.(R)(T) Special Procedures Navigator

## 2018-03-18 LAB — CULTURE, BLOOD (ROUTINE X 2): Culture: NO GROWTH

## 2018-03-18 IMAGING — DX DG CHEST 2V
2 series · 2 of 2 positions shown · non-contrast
Comparison: Portable chest x-ray January 04, 2015

CLINICAL DATA: Chronic shortness of breath and cough, weight loss,
COPD, current smoker.

EXAM:
CHEST  2 VIEW

[chest pa]
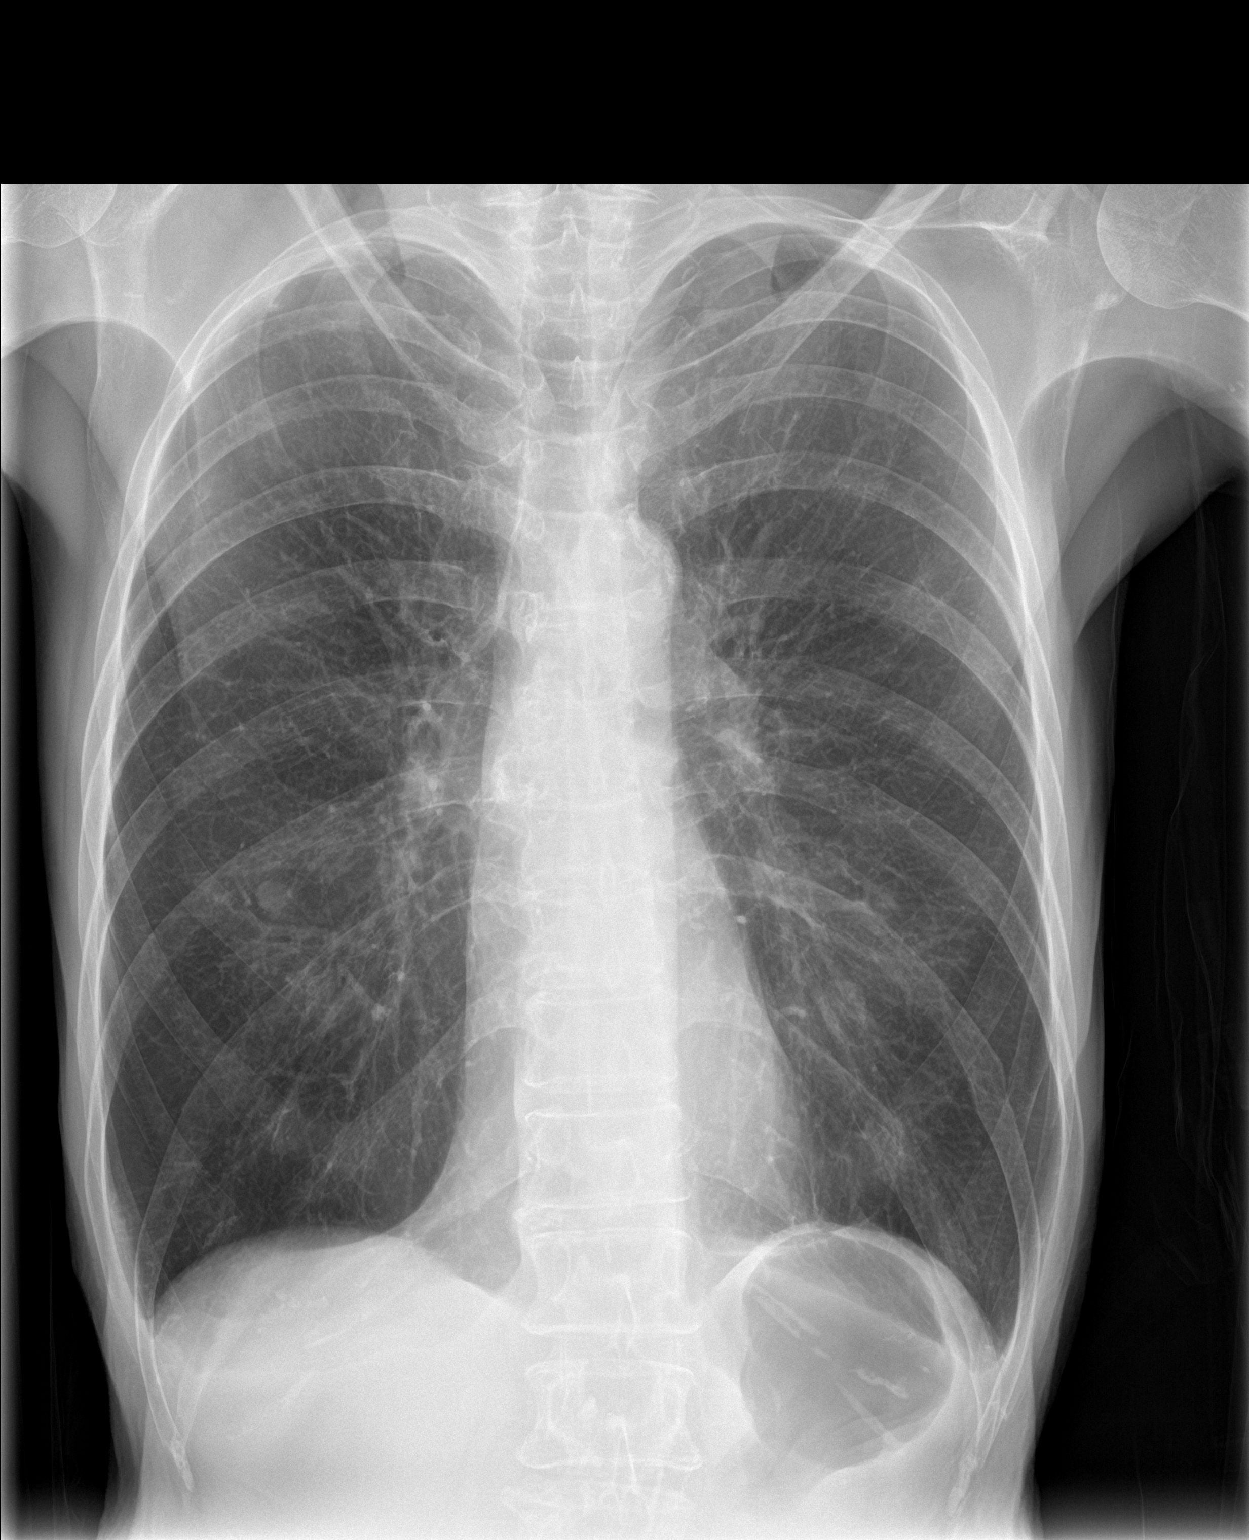

[chest lat]
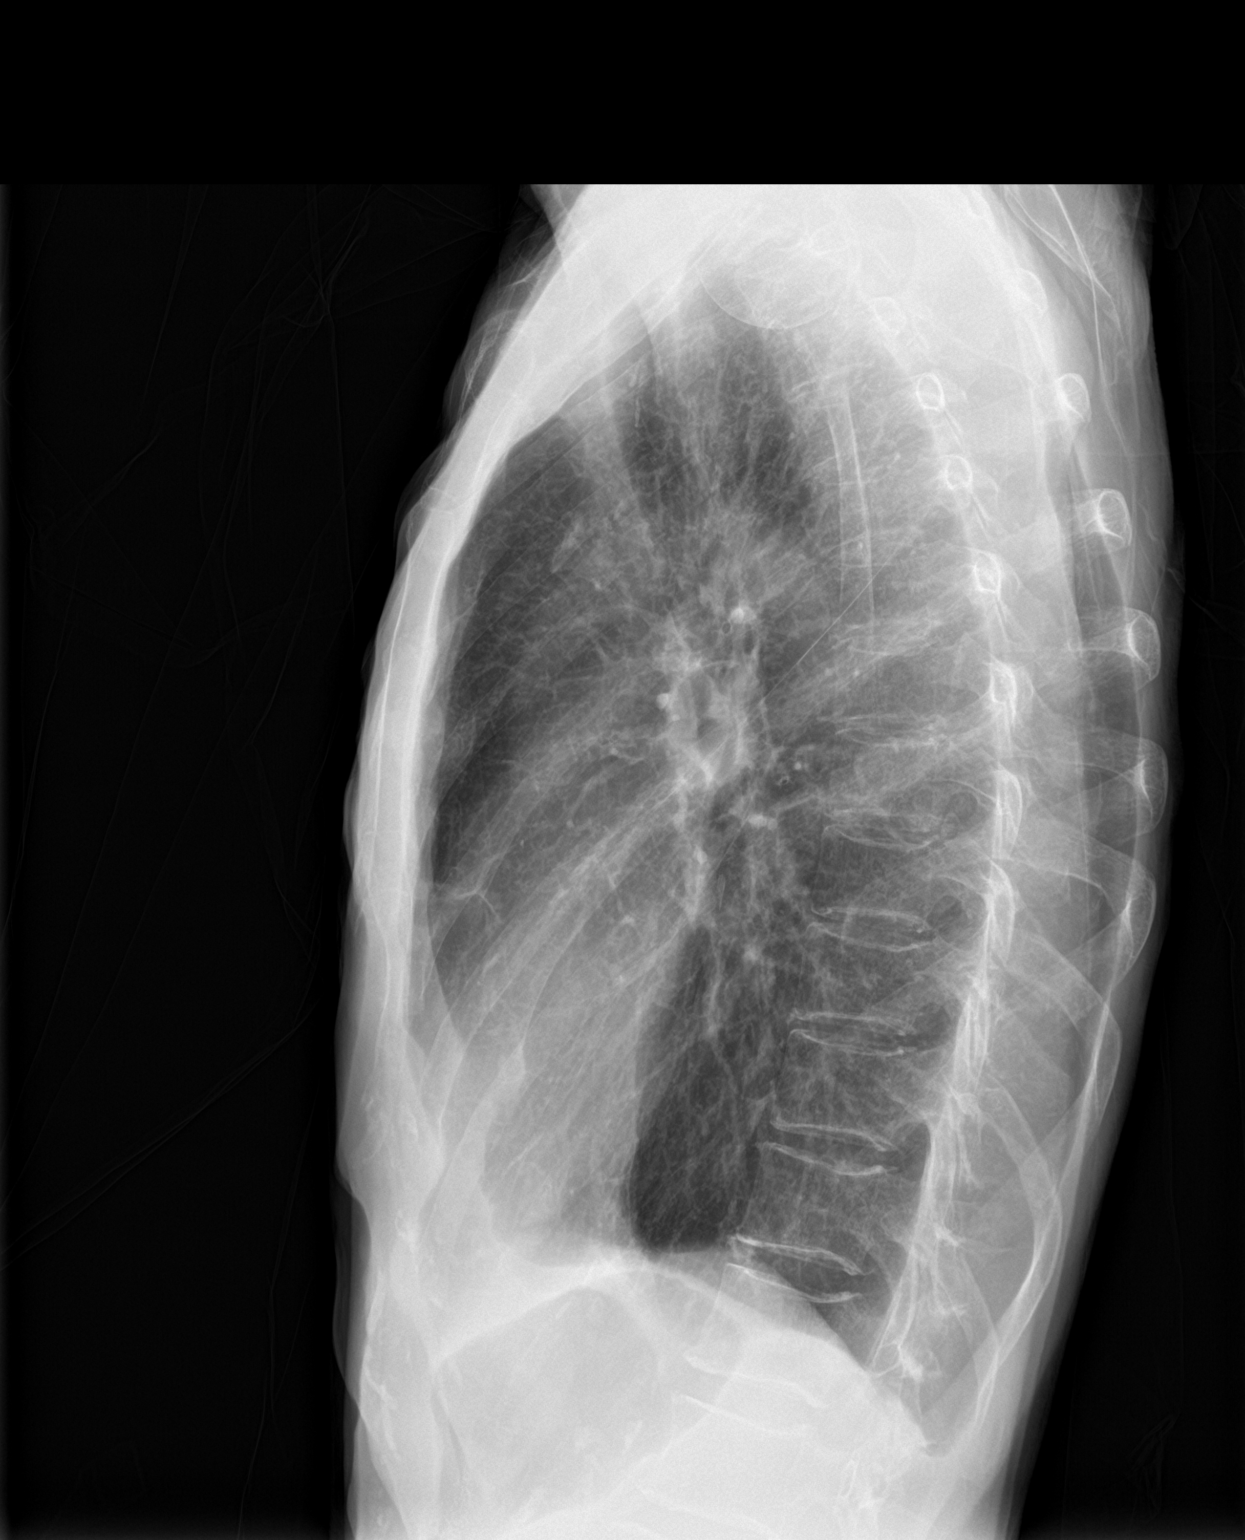

[2 of 2 positions shown; findings below may reference images not displayed]

FINDINGS: The lungs are hyperinflated with hemidiaphragm flattening. There is
no focal infiltrate. There is no pleural effusion. The heart and
pulmonary vascularity are normal. There is calcification in the wall
of the aortic arch. Nipple shadows are visible bilaterally. The bony
thorax exhibits no acute abnormality.
IMPRESSION: Marked hyperinflation consistent with COPD. No evidence of pulmonary
parenchymal masses, pneumonia, nor other acute cardiopulmonary
abnormality.

Aortic atherosclerosis.

## 2018-03-23 ENCOUNTER — Telehealth: Payer: Self-pay | Admitting: Radiation Therapy

## 2018-03-23 NOTE — Telephone Encounter (Signed)
[place] to verify that [name] has decreased the steroid dose to 4mg  BID and to make them aware of an appointment we have scheduled for her to come in and see Dr. Mickeal Skinner on 3/24 for further evaluation.   The dose has been decreased as instructed, she has however transitioned to Hospice care and all outside visits/travel for [facility] has been cancelled due to Covid 19 precautions. They will not be able to bring her to the appointment on 3/24, and her medications and care at the facility will now be managed through Hospice. I will communicate this to her care team and cancel the 3/24 visits.   Mont Dutton R.T.(R)(T) Special Procedures Navigator

## 2018-03-28 ENCOUNTER — Ambulatory Visit: Payer: Self-pay | Admitting: Internal Medicine

## 2018-04-11 ENCOUNTER — Inpatient Hospital Stay: Payer: Medicare Other

## 2018-04-11 ENCOUNTER — Telehealth: Payer: Self-pay | Admitting: *Deleted

## 2018-04-11 ENCOUNTER — Inpatient Hospital Stay: Payer: Medicare Other | Admitting: Oncology

## 2018-04-11 NOTE — Telephone Encounter (Addendum)
Spoke with nurse manager regarding patient since she is not able to make her appointment today due to facility is closed to patients leaving and returning. Nurse reports physical therapy no longer follows once [facility] started seeing her on 03/16/2018. Her foot ulcer is healing. No change in her chronic pain. She goes outside to smoke and refuses to wear oxygen. She eats small amounts of what she likes. She is usually alert, but slight confusion at times since she has not been able to leave facility and she does not see her husband currently. Most likely she could talk with MD on the phone if needed, would need to catch her at the right time. Currently on Decadron 4 mg tid. Dr. Benay Spice notified. No new orders. Will follow up the end of April to see where we are with restrictions.

## 2018-05-11 NOTE — Telephone Encounter (Signed)
Message sent to provider 

## 2018-05-23 ENCOUNTER — Other Ambulatory Visit: Payer: Self-pay | Admitting: Family Medicine

## 2018-05-23 NOTE — Telephone Encounter (Signed)
Requested medication (s) are due for refill today: yes  Requested medication (s) are on the active medication list: no  Last refill:  03/15/17  Future visit scheduled: no  Notes to clinic:  rx for this med ended 06/28/17 NT  not refilling for Dr Clovis Riley   Requested Prescriptions  Pending Prescriptions Disp Refills   DULERA 200-5 MCG/ACT AERO [Pharmacy Med Name: DULERA 200-5MCG ORAL INHALER 120INH] 39 g 3    Sig: INHALE 2 PUFFS INTO THE LUNGS TWICE DAILY     There is no refill protocol information for this order

## 2018-07-05 DEATH — deceased

## 2018-09-12 IMAGING — CT CT ABD-PELV W/ CM
2 of 5 series · 16 of 46 positions shown, 18 images · IV contrast (iopamidol)
Comparison: 02/28/2016.

CLINICAL DATA: Cough, nausea, abdominal pain.

EXAM:
CT ABDOMEN AND PELVIS WITH CONTRAST
TECHNIQUE: Multidetector CT imaging of the abdomen and pelvis was performed
using the standard protocol following bolus administration of
intravenous contrast.
CONTRAST:  80mL [id number] IOPAMIDOL ([id number]-DLL) INJECTION 61%

[Series 2: abd/pel with · axial · 0.63mm/px · z∈[-461,-146]mm · 13 of 75 slices shown, 15 images]
[im 6/75  soft-tissue]
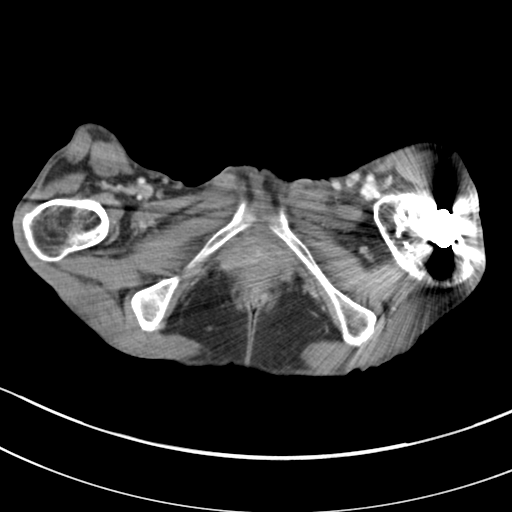
[im 6/75  bone]
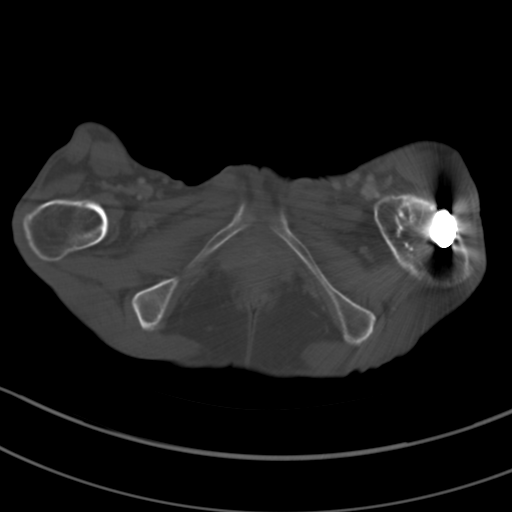
[im 11/75  soft-tissue]
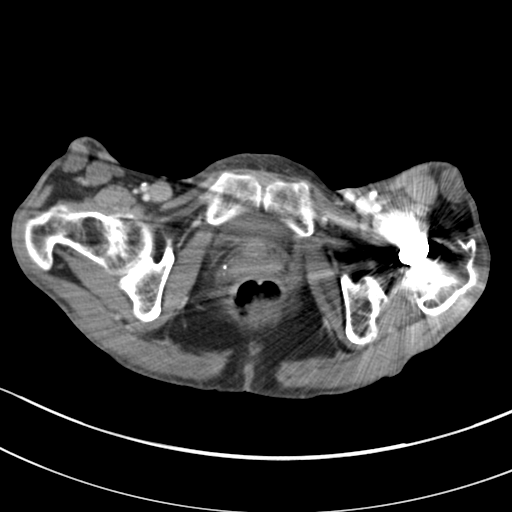
[im 16/75  soft-tissue]
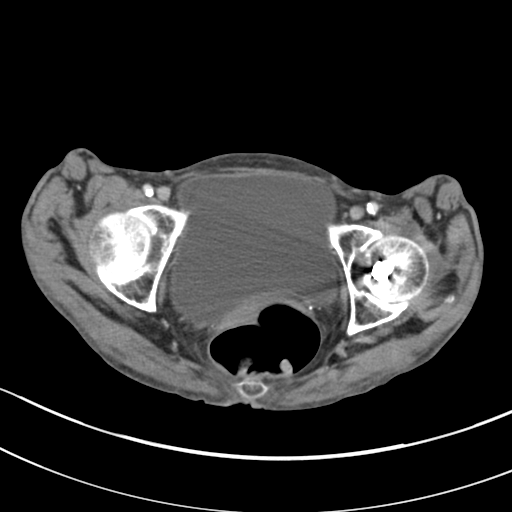
[im 22/75  soft-tissue]
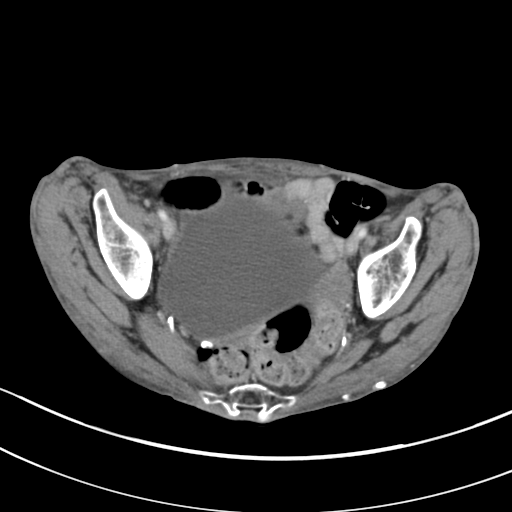
[im 27/75  soft-tissue]
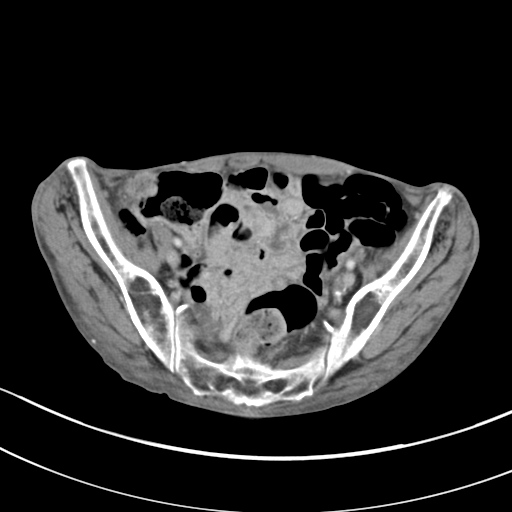
[im 32/75  soft-tissue]
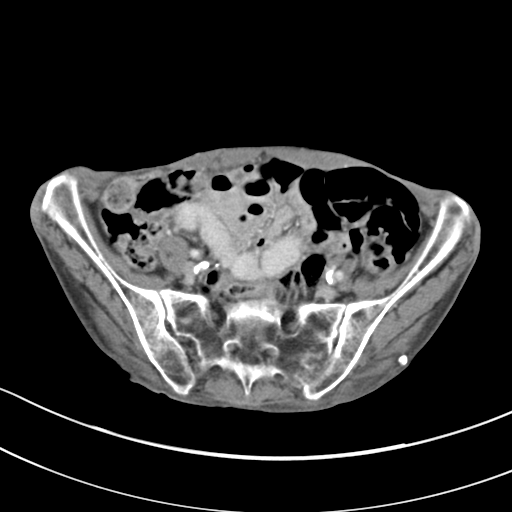
[im 38/75  soft-tissue]
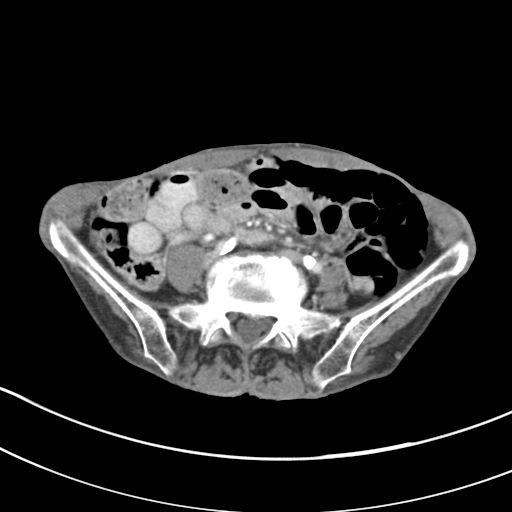
[im 43/75  soft-tissue]
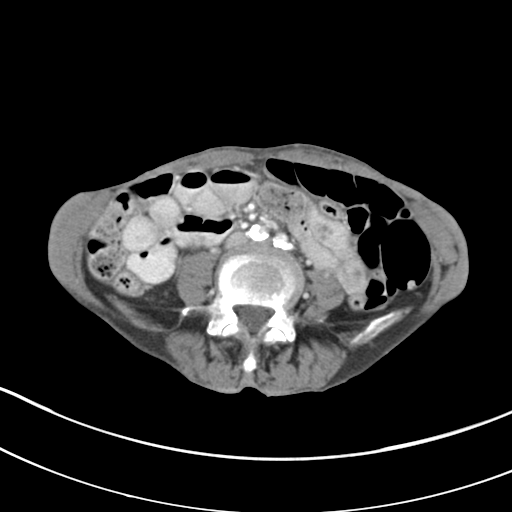
[im 48/75  soft-tissue]
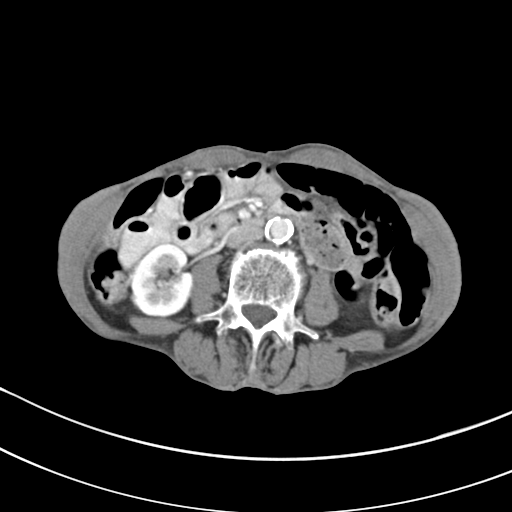
[im 48/75  bone]
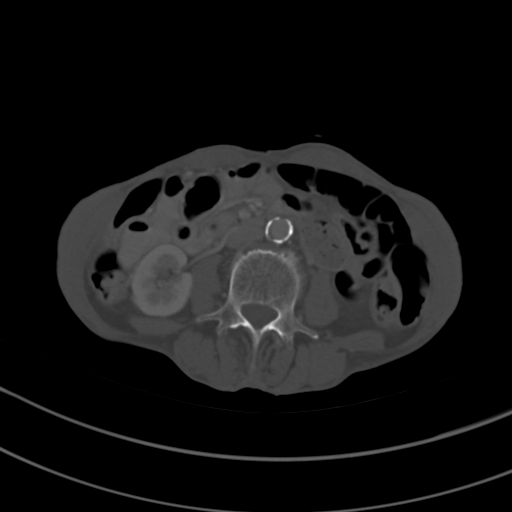
[im 53/75  soft-tissue]
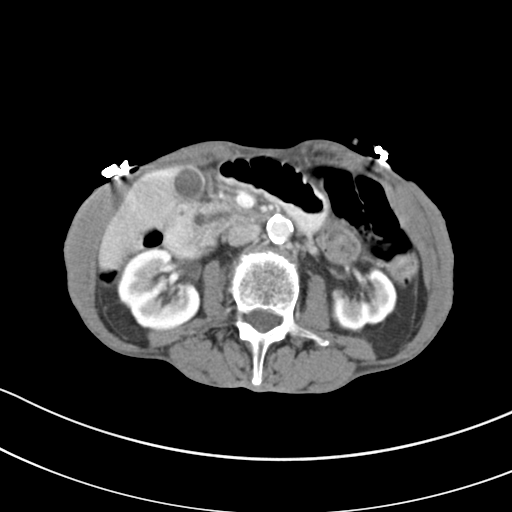
[im 59/75  soft-tissue]
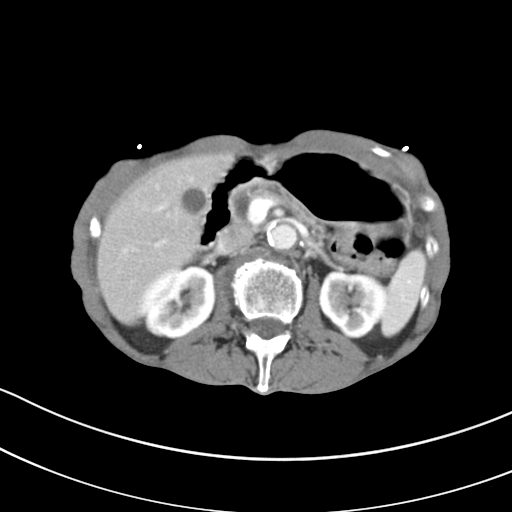
[im 64/75  soft-tissue]
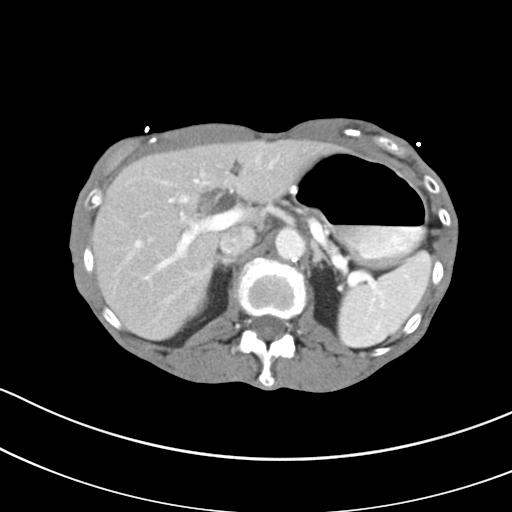
[im 69/75  soft-tissue]
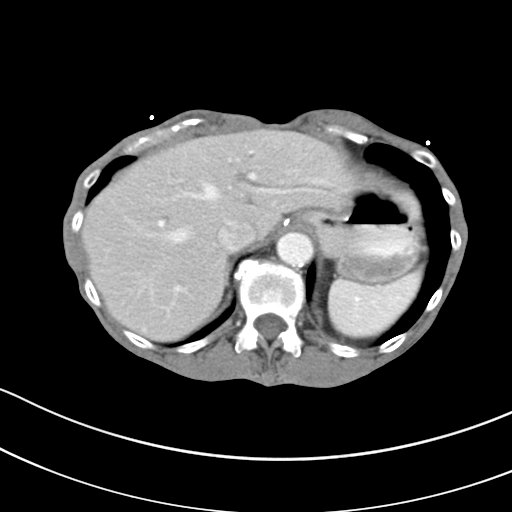

[Series 5: coronal a/|p · coronal · 0.57mm/px · 3 of 93 slices shown]
[im 31/93  soft-tissue]
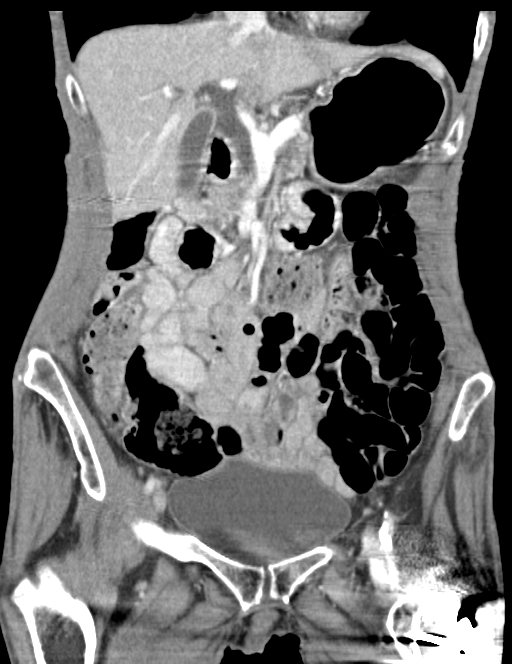
[im 41/93  soft-tissue]
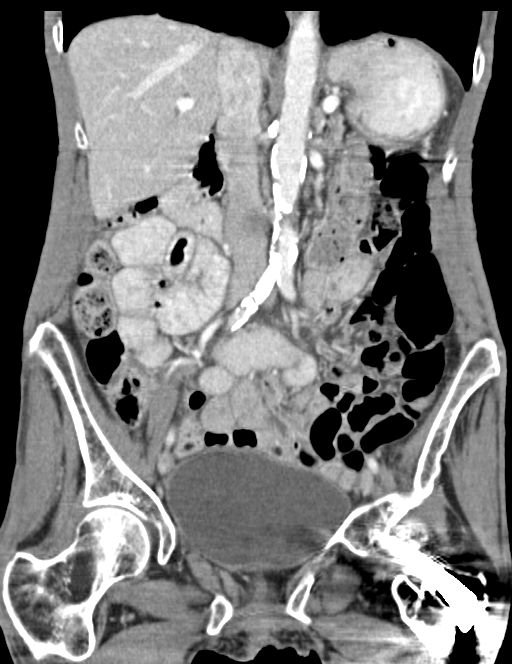
[im 52/93  soft-tissue]
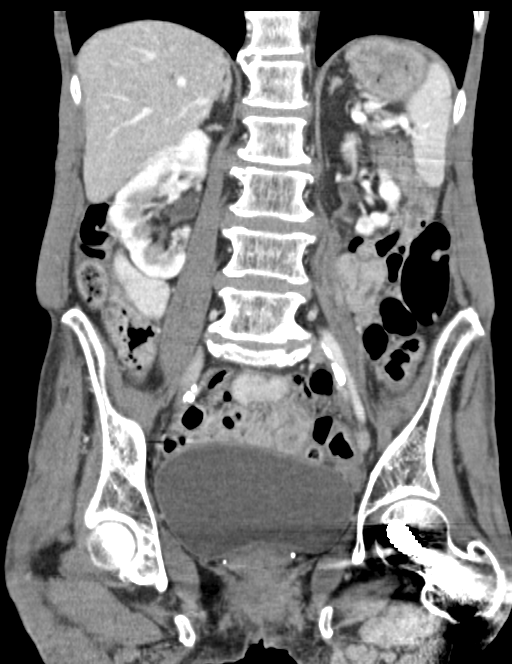

[16 of 46 positions shown; findings below may reference images not displayed]

FINDINGS: Lower chest: Lung bases show no acute findings. Heart size normal.
No pericardial or pleural effusion.

Hepatobiliary: The liver is unremarkable. Cholecystectomy.
Extrahepatic bile duct measures 10 mm, unchanged.

Pancreas: Atrophic.

Spleen: Negative.

Adrenals/Urinary Tract: Right adrenal gland is unremarkable. A
possibly bilobed low-attenuation lesion off the lower pole right
kidney contains peripheral calcification and measures approximately
1.3 x 2.1 cm, stable. Ureters are decompressed. Bladder is grossly
unremarkable.

Stomach/Bowel: Stomach, small bowel, appendix and colon are
unremarkable.

Vascular/Lymphatic: Atherosclerotic calcification of the arterial
vasculature without abdominal aortic aneurysm. No pathologically
enlarged lymph nodes.

Reproductive: Uterus appears to be absent.  No adnexal mass.

Other:  No free fluid.  Mesenteries and peritoneum are unremarkable.

Musculoskeletal: Postoperative changes in the left femur. No
worrisome lytic or sclerotic lesions. Degenerative changes are seen
in the spine.
IMPRESSION: 1. No acute findings to explain the patient's nausea and abdominal
pain.
2. Cryoablation defect along the lower pole left kidney is stable.
3.  Aortic atherosclerosis (288I0-170.0).

## 2018-09-12 IMAGING — CR DG CHEST 2V
3 series · 3 of 3 positions shown · non-contrast
Comparison: Prior radiograph from 09/03/2015.

CLINICAL DATA: Initial evaluation for acute shortness of breath.
History of COPD.

EXAM:
CHEST  2 VIEW

[w chest lat]
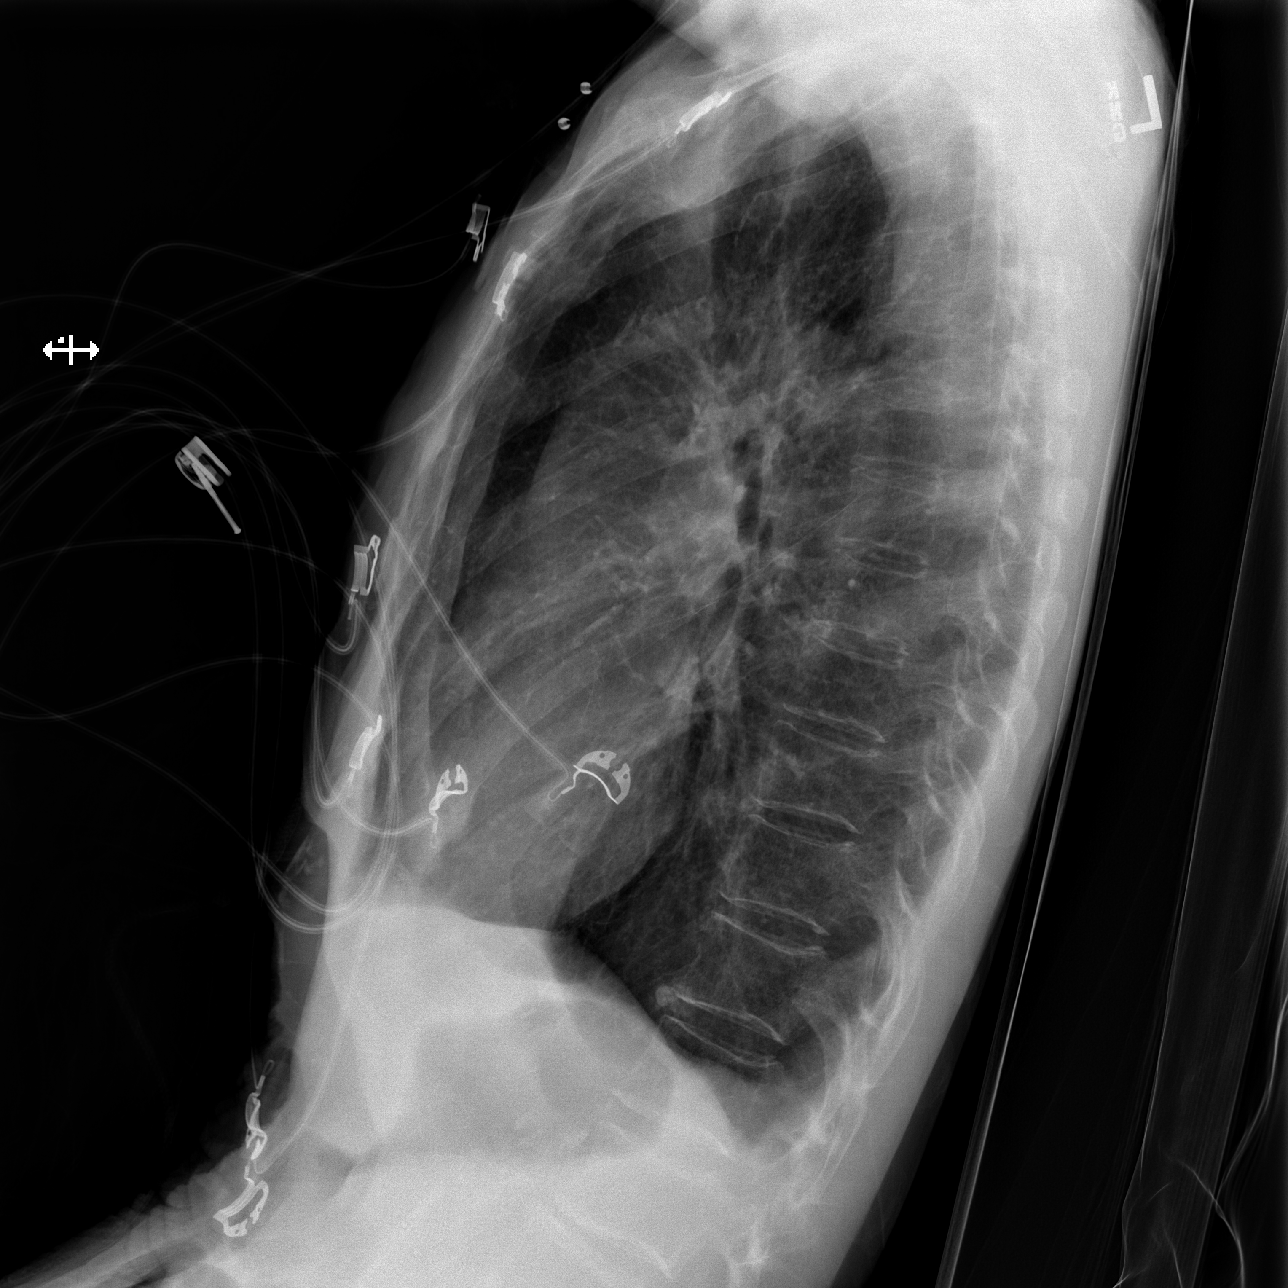

[x chest ap (1 of 2)]
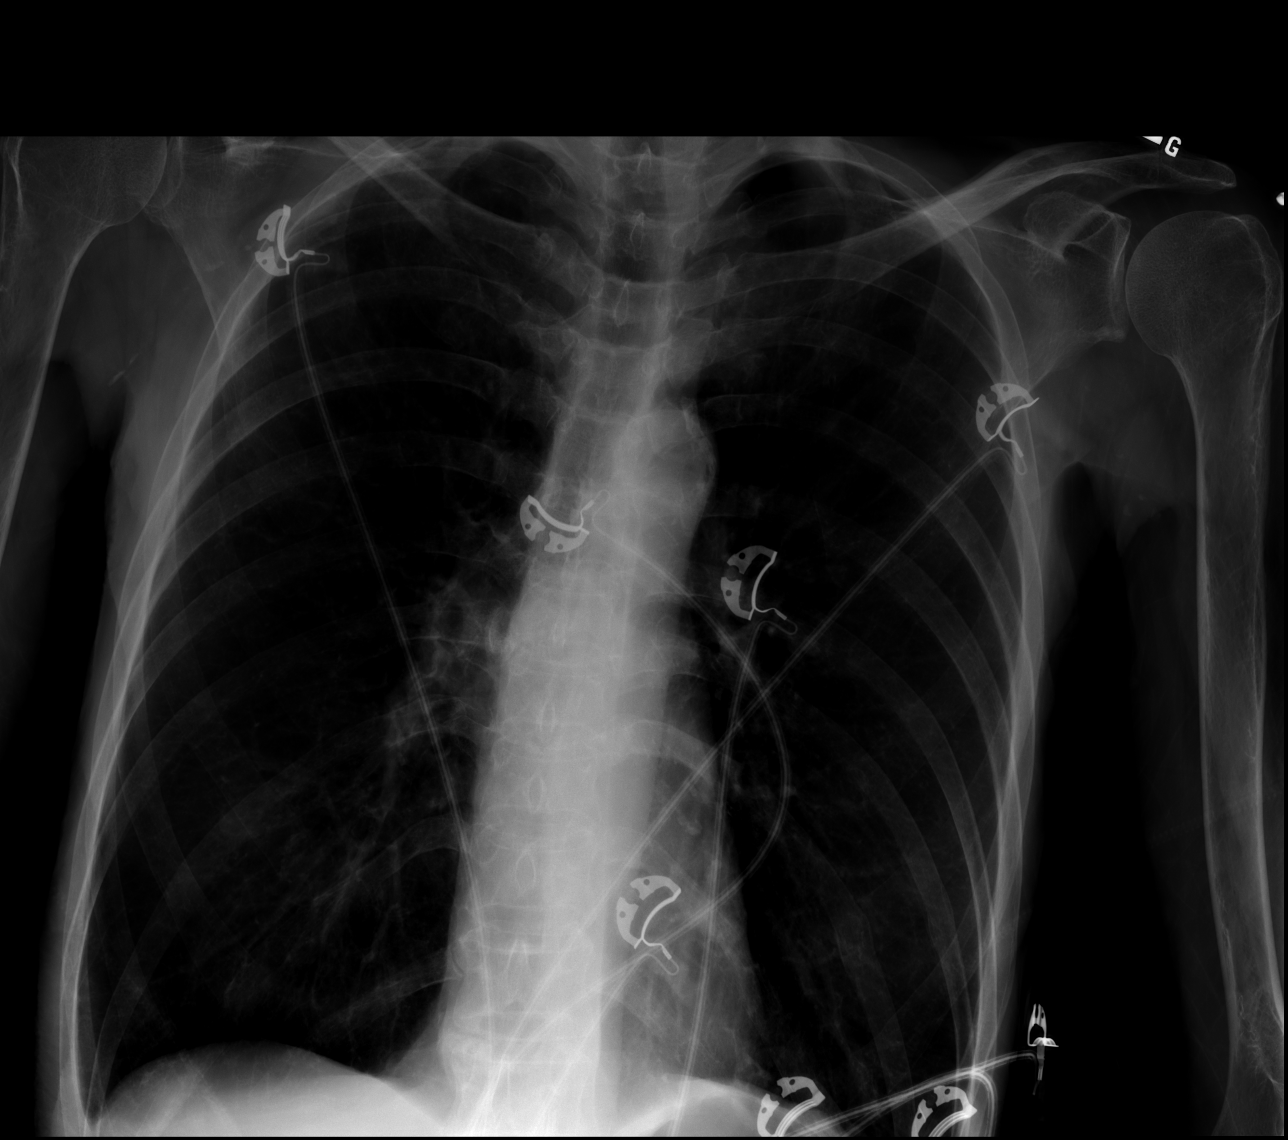

[x chest ap (2 of 2)]
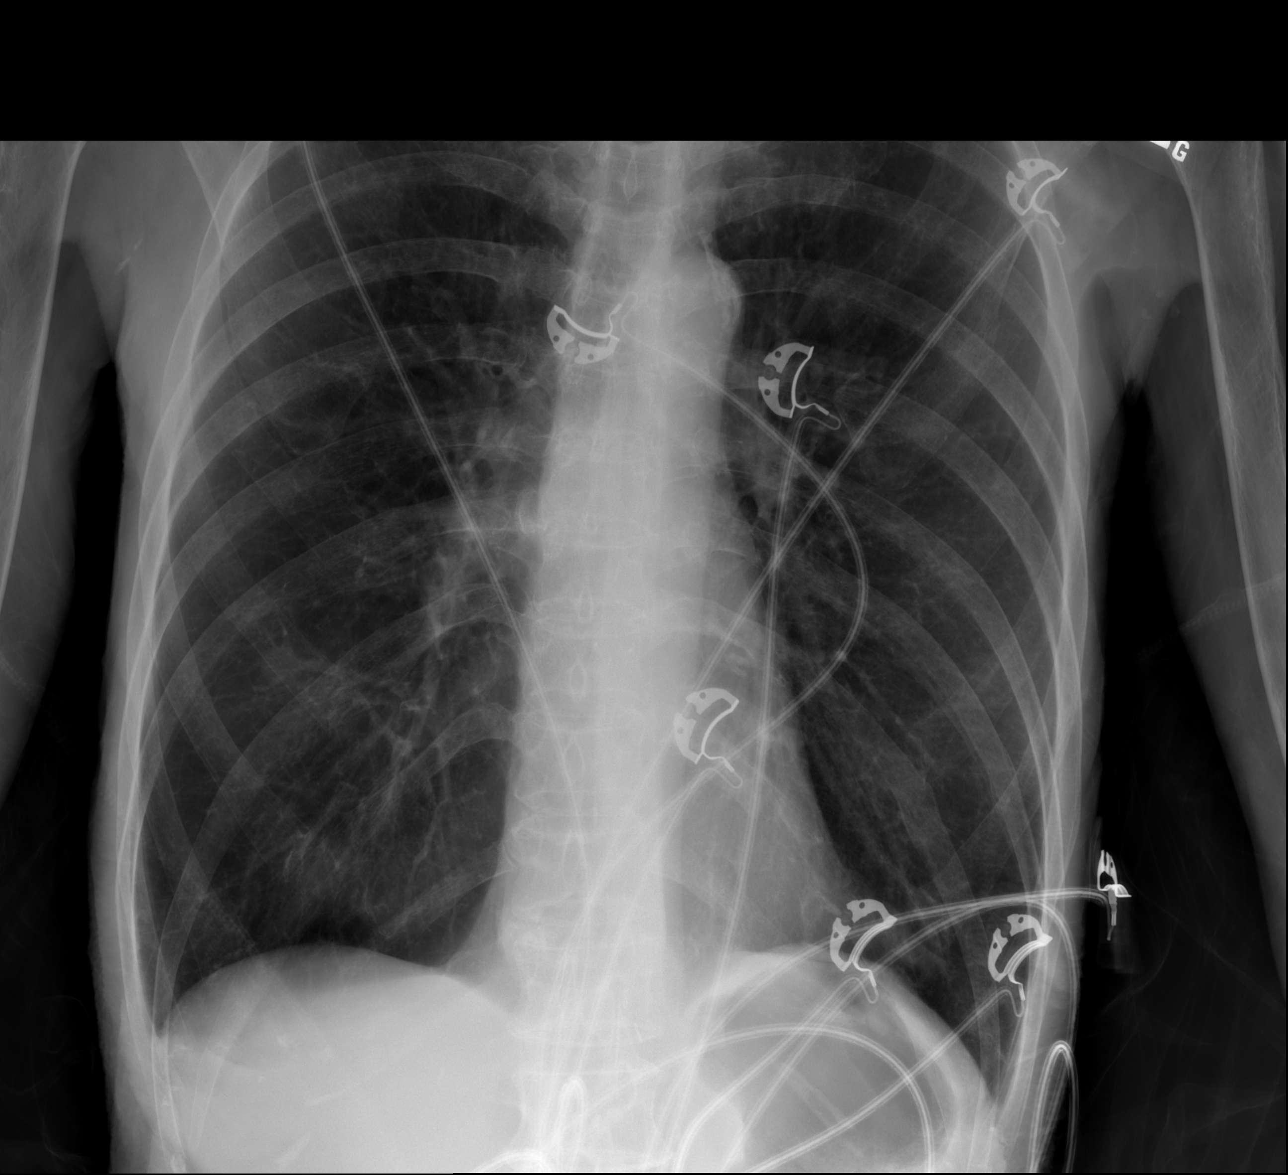

[3 of 3 positions shown; findings below may reference images not displayed]

FINDINGS: The cardiac and mediastinal silhouettes are stable in size and
contour, and remain within normal limits.

The lungs are hyperinflated with severe attenuation of the pulmonary
markings, compatible with emphysema. No airspace consolidation,
pleural effusion, or pulmonary edema is identified. There is no
pneumothorax.

No acute osseous abnormality identified.
IMPRESSION: 1. No active cardiopulmonary disease.
2. Emphysema.

## 2018-11-02 IMAGING — CR DG CHEST 2V
3 series · 3 of 3 positions shown · non-contrast
Comparison: 02/28/2016

CLINICAL DATA: Chest pain for 1 week

EXAM:
CHEST  2 VIEW

[w chest lat]
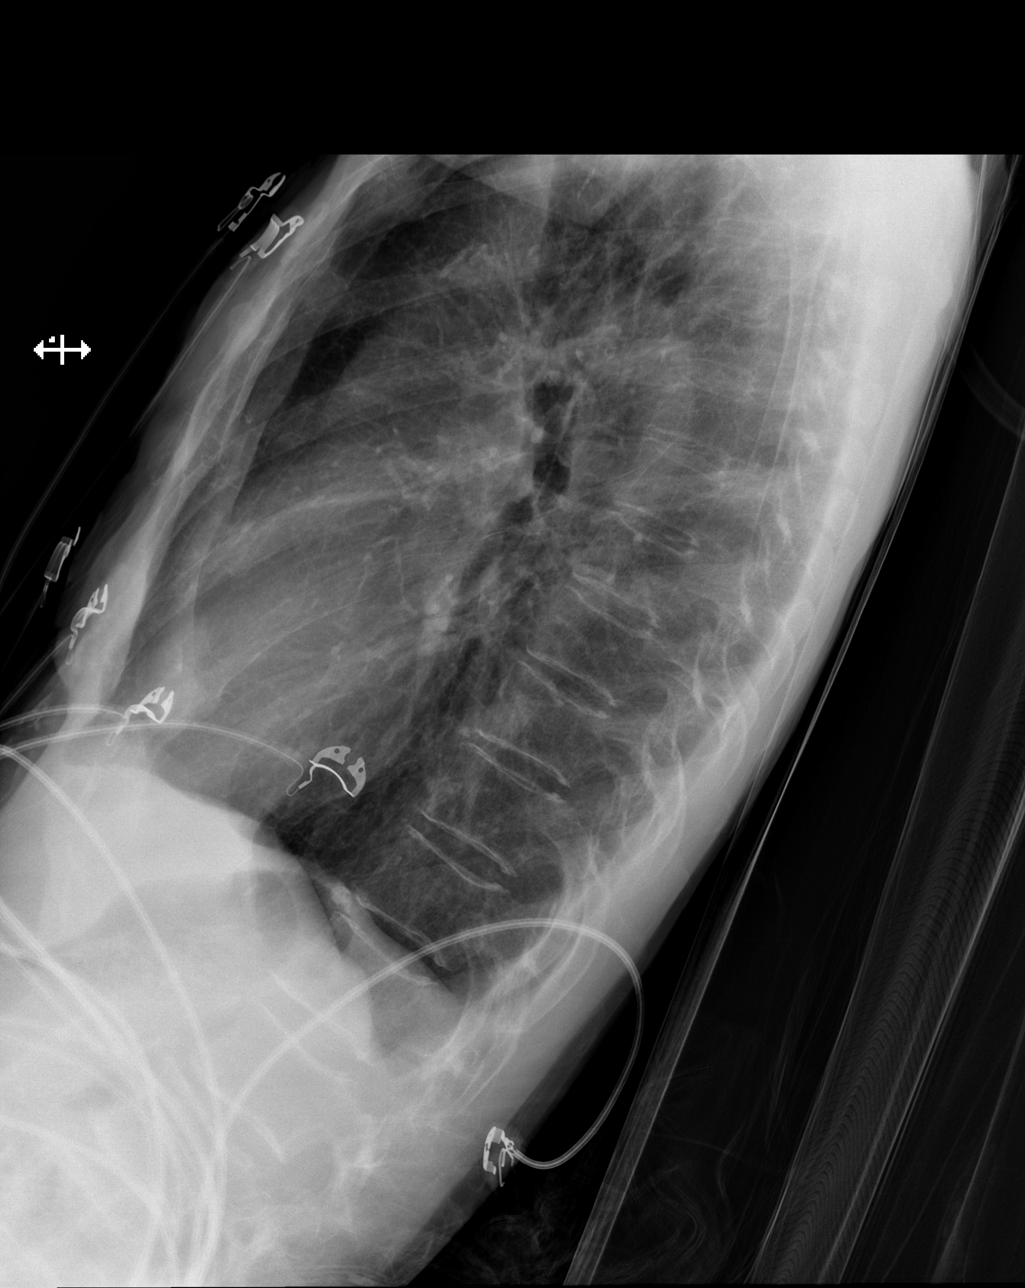

[x chest ap (1 of 2)]
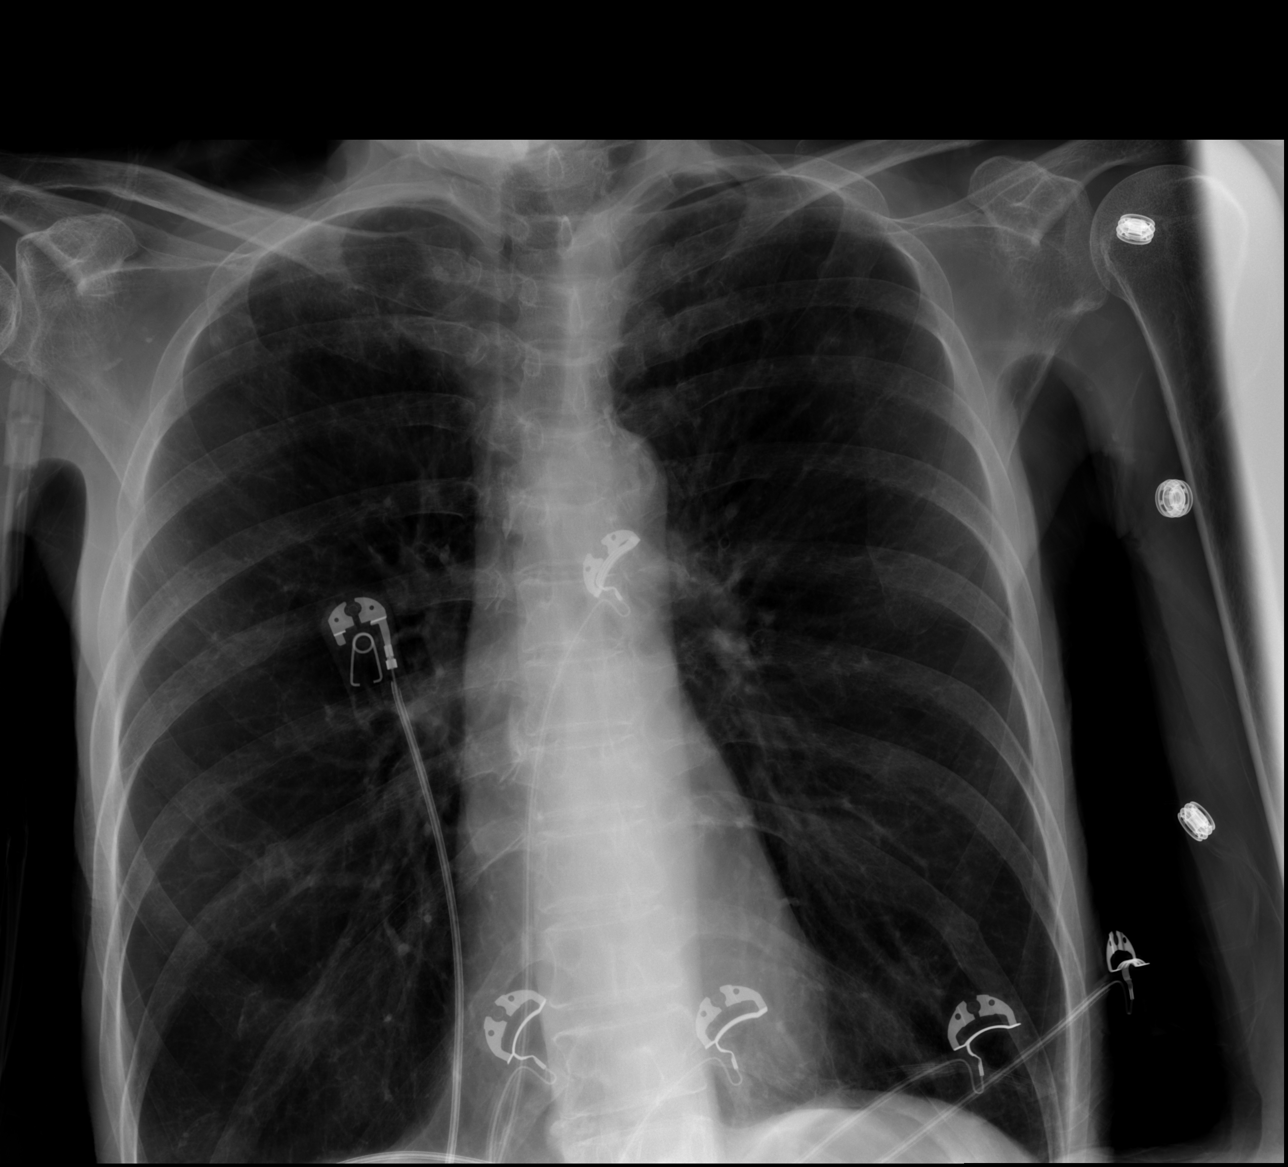

[x chest ap (2 of 2)]
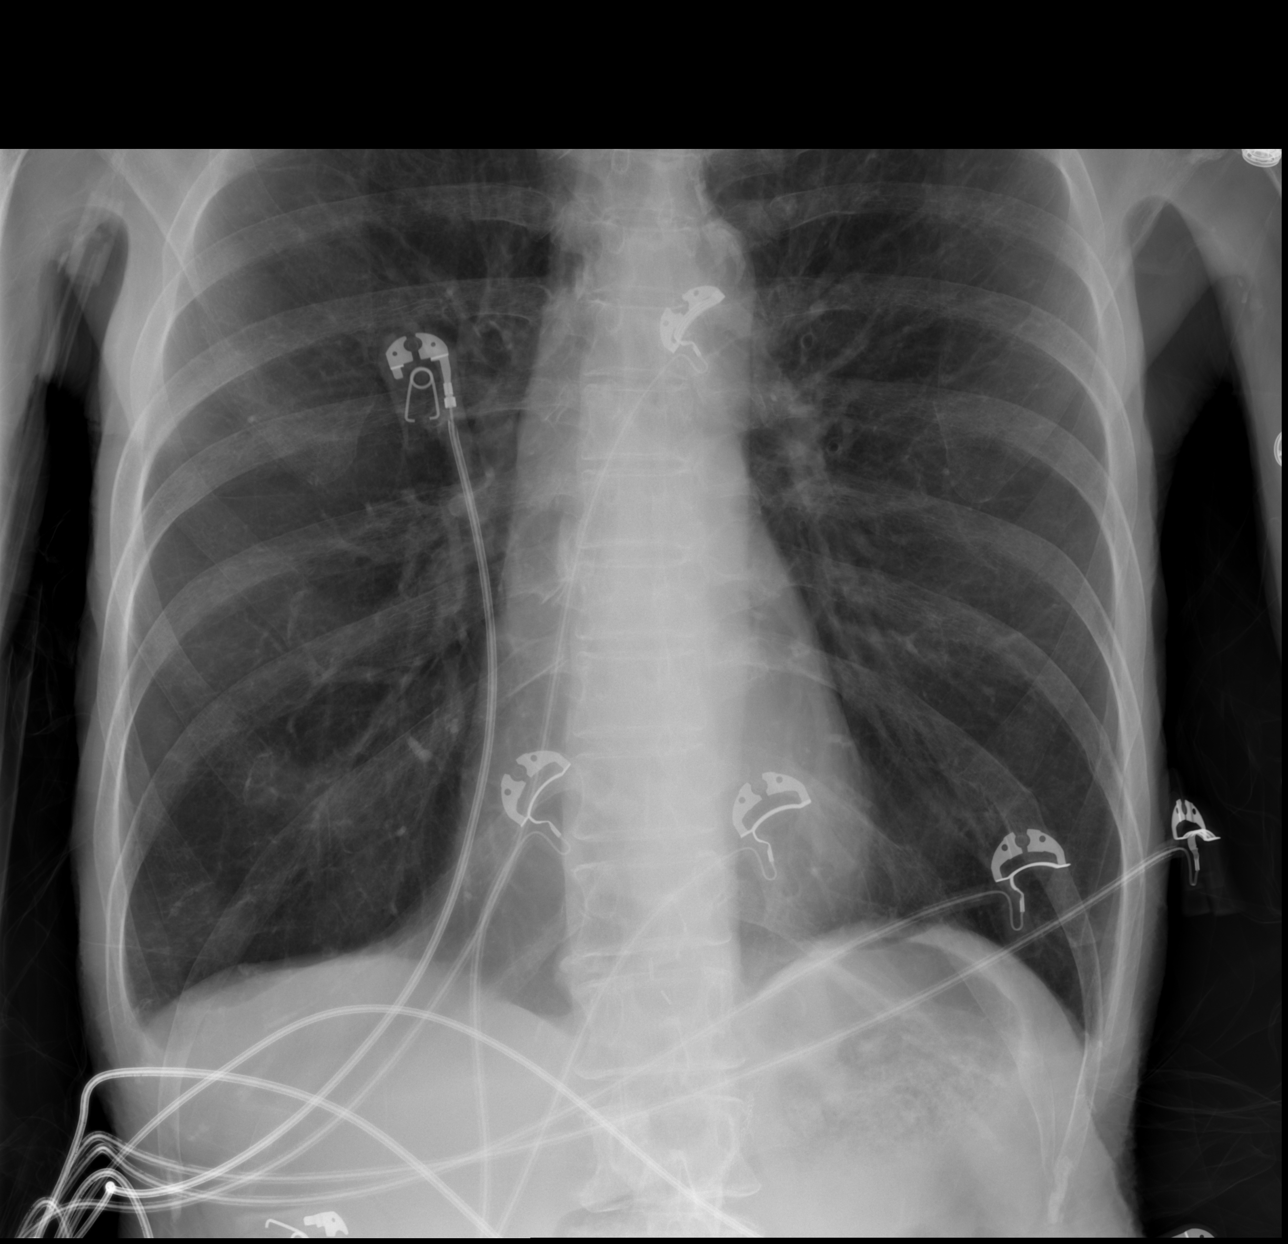

[3 of 3 positions shown; findings below may reference images not displayed]

FINDINGS: Cardiac shadow is within normal limits. The lungs are hyperinflated
consistent with COPD. No acute infiltrate or sizable effusion is
noted. Few scattered calcified granulomas are seen. Old rib
fractures are noted on the left and stable. No acute abnormality is
noted.
IMPRESSION: COPD without acute abnormality.

## 2019-04-04 IMAGING — US US ABDOMEN LIMITED
1 series · 14 of 25 positions shown · non-contrast
Comparison: CT of the abdomen and pelvis performed 01/15/2017

CLINICAL DATA: Acute onset of right upper quadrant abdominal pain,
nausea and vomiting.

EXAM:
ULTRASOUND ABDOMEN LIMITED RIGHT UPPER QUADRANT

[Series 1: us abdomen limited · 0.19mm/px · 14 of 42 slices shown]
[im 1/42]
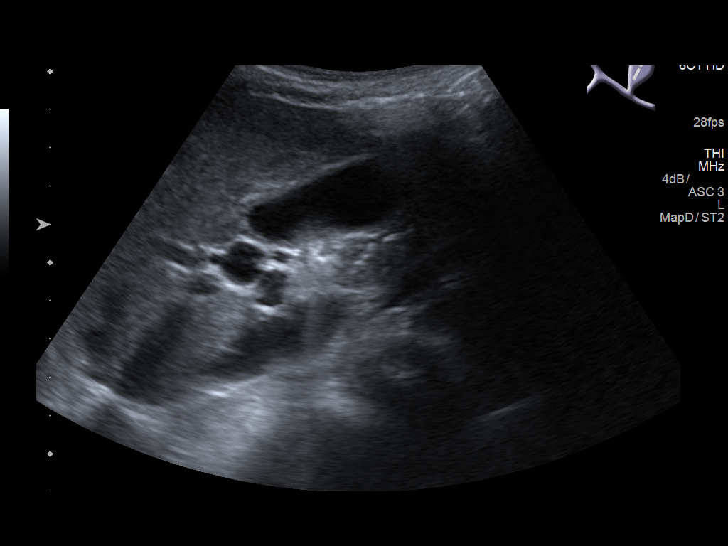
[im 4/42]
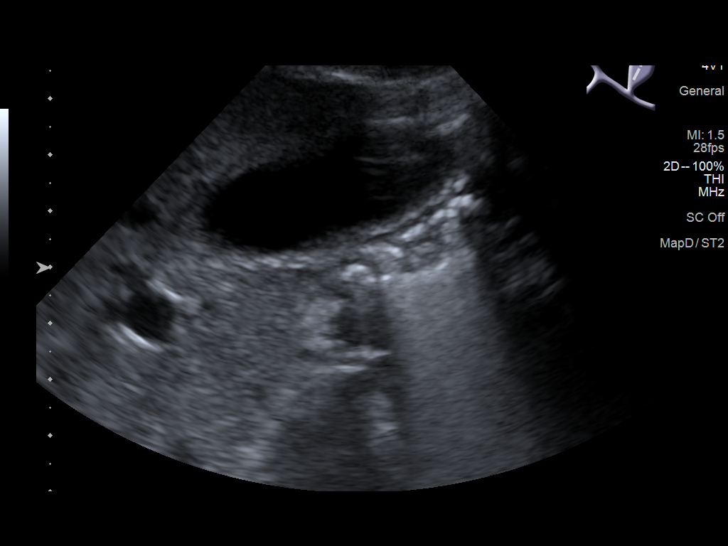
[im 7/42]
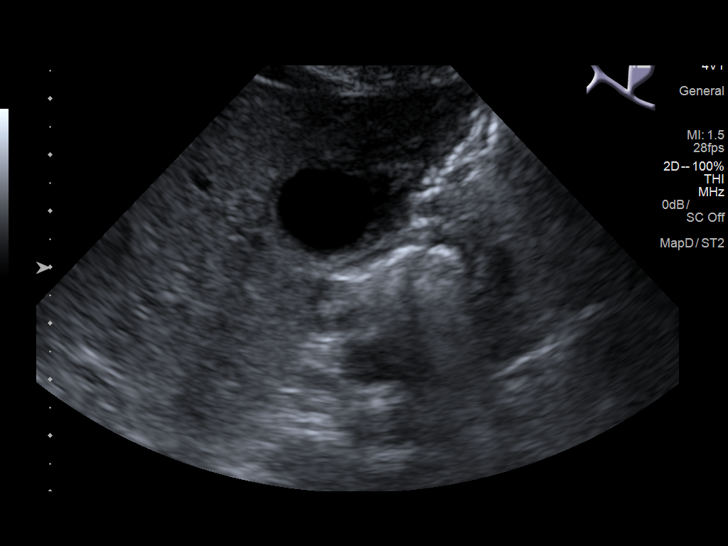
[im 11/42]
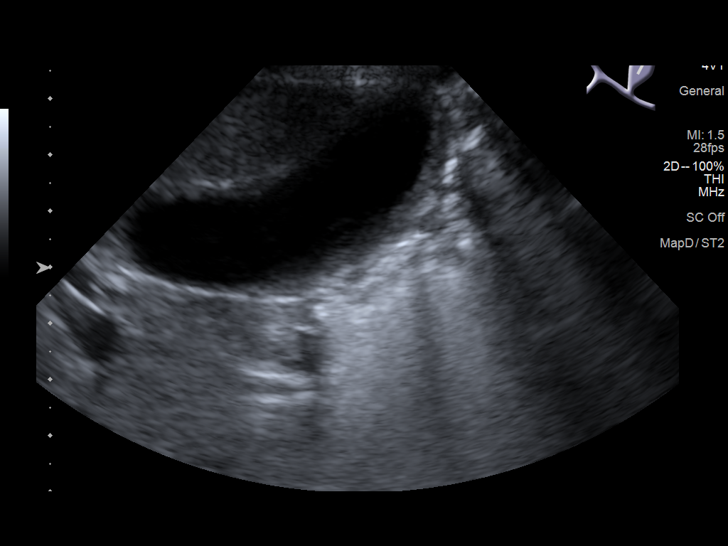
[im 14/42]
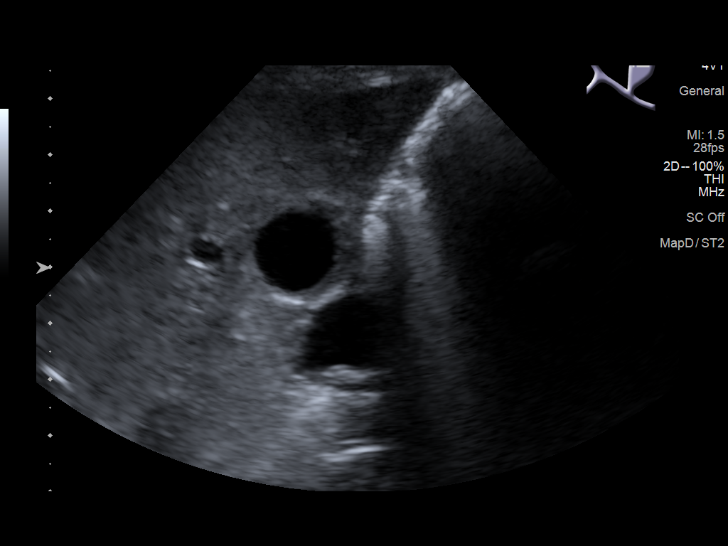
[im 16/42]
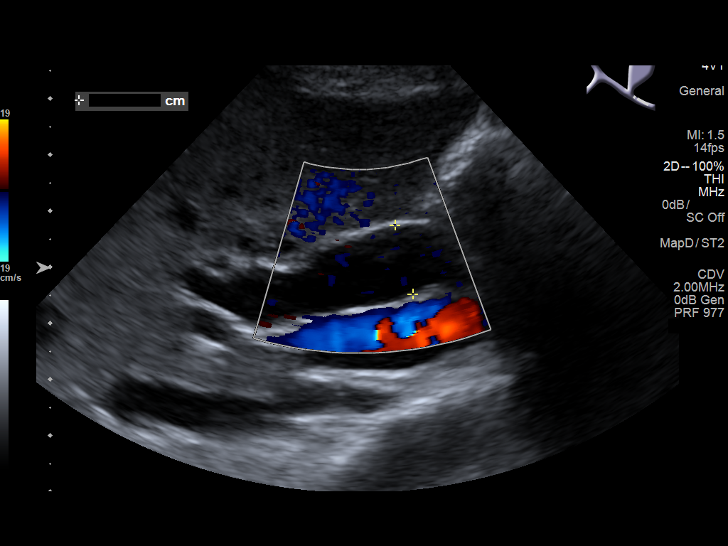
[im 19/42]
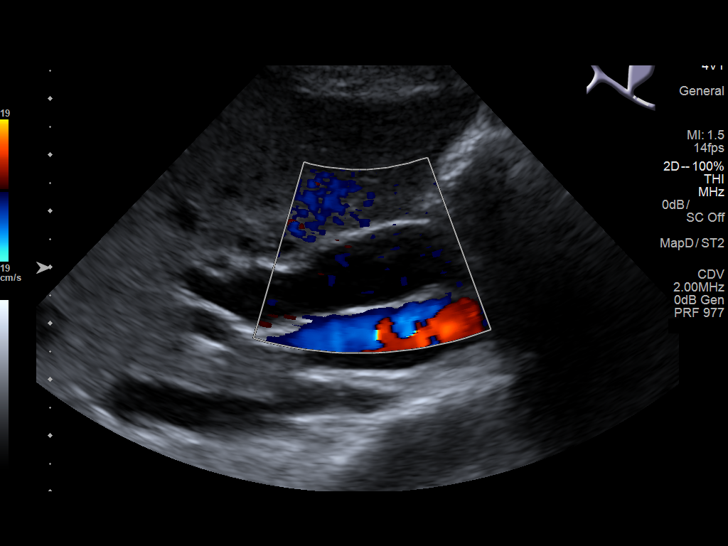
[im 23/42]
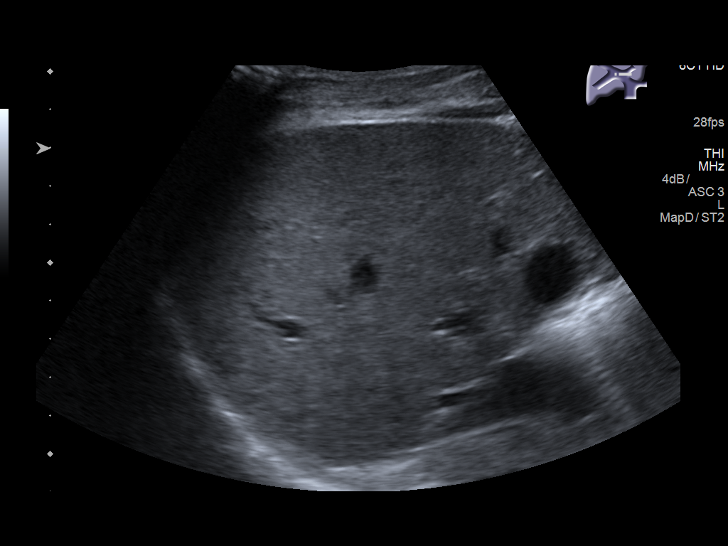
[im 26/42]
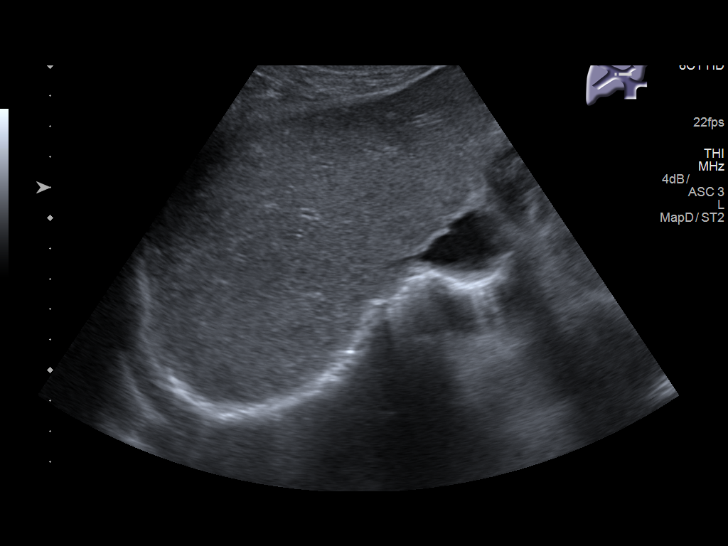
[im 28/42]
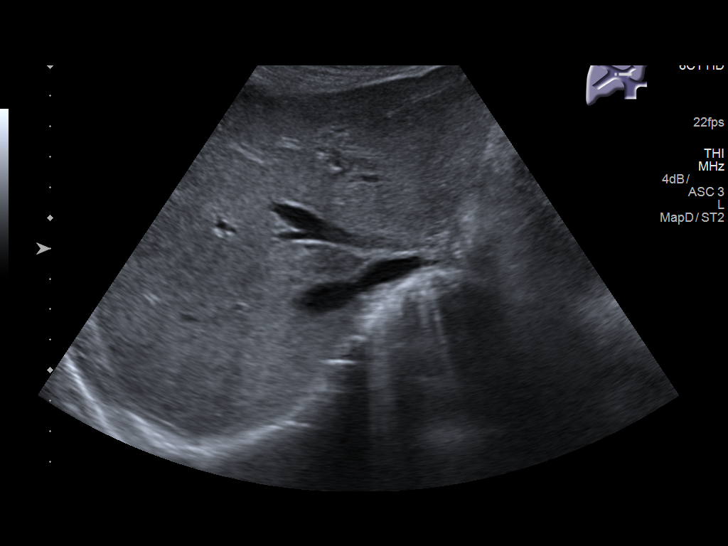
[im 31/42]
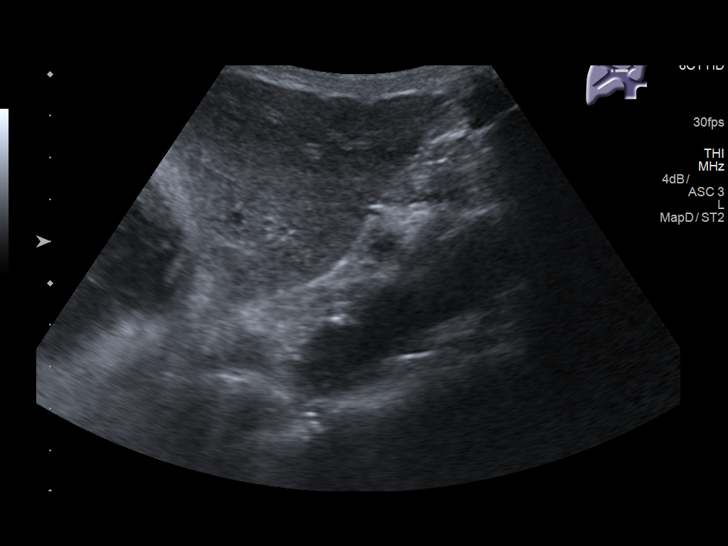
[im 35/42]
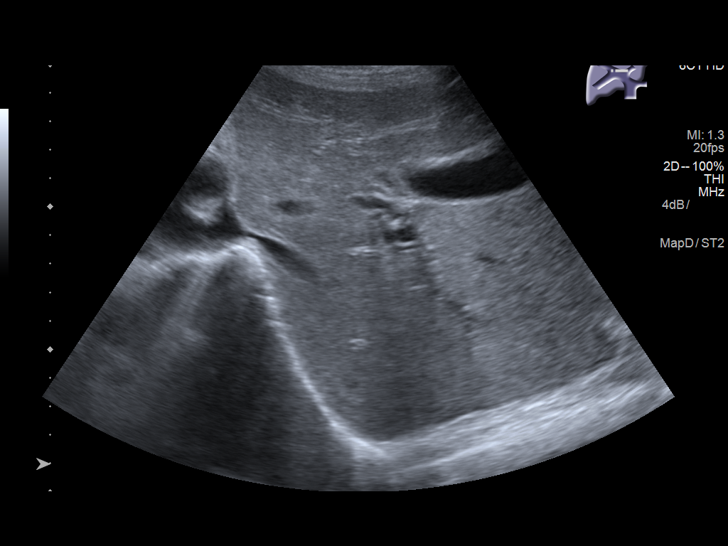
[im 38/42]
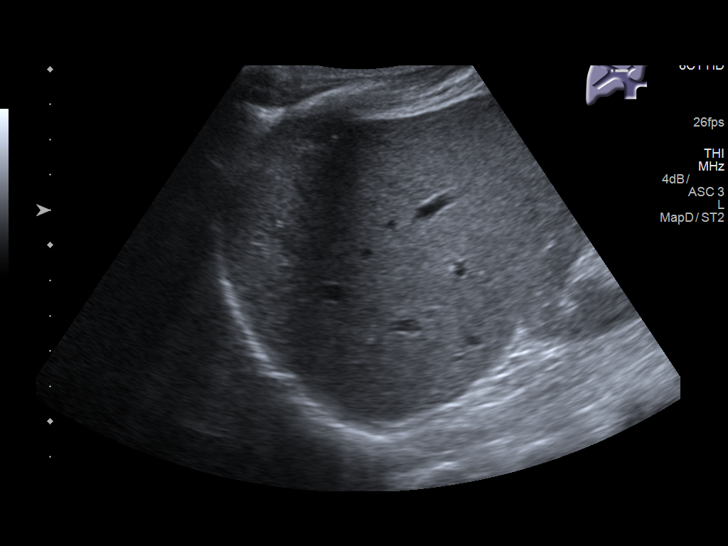
[im 42/42]
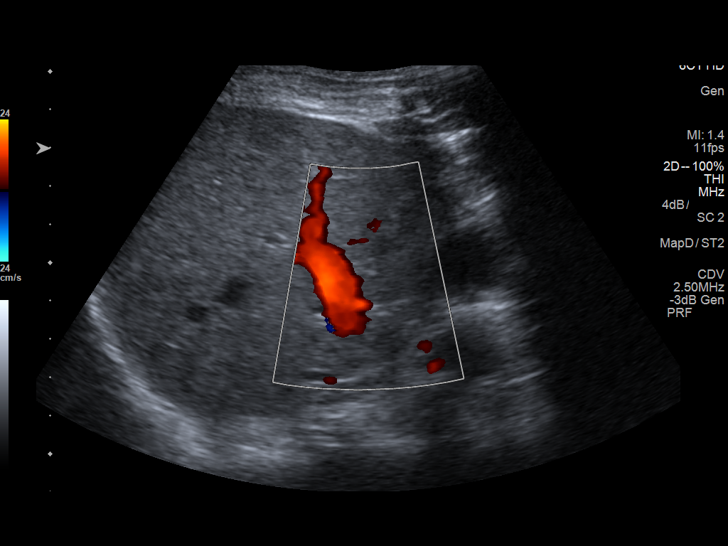

[14 of 25 positions shown; findings below may reference images not displayed]

FINDINGS: Gallbladder:

Mild gallbladder wall thickening is suggested. No pericholecystic
fluid is seen. No stones are identified. No ultrasonographic
Murphy's sign is elicited.

Common bile duct:

Diameter: 1.3 cm, diffusely dilated, as noted on prior CTs.

Liver:

No focal lesion identified. Within normal limits in parenchymal
echogenicity. Portal vein is patent on color Doppler imaging with
normal direction of blood flow towards the liver.
IMPRESSION: 1. No evidence of acute cholecystitis.  No obstructing stone seen.
2. Persistent diffuse dilatation of the common bile duct to 1.3 cm
in maximal diameter, of uncertain significance. Would correlate with
LFTs.
3. Mild gallbladder wall thickening suggested. Gallbladder otherwise
unremarkable.

## 2019-04-23 IMAGING — CR DG HIP (WITH OR WITHOUT PELVIS) 2-3V*R*
4 series · 4 of 4 positions shown · non-contrast
Comparison: 11/16/2014 and CT 02/28/2016

CLINICAL DATA: Fall 4 months ago with subsequent hip surgery. Right
hip pain beginning 1-2 weeks ago worse last night. No recent injury.

EXAM:
DG HIP (WITH OR WITHOUT PELVIS) 2-3V RIGHT

[x pelvis (1 of 2)]
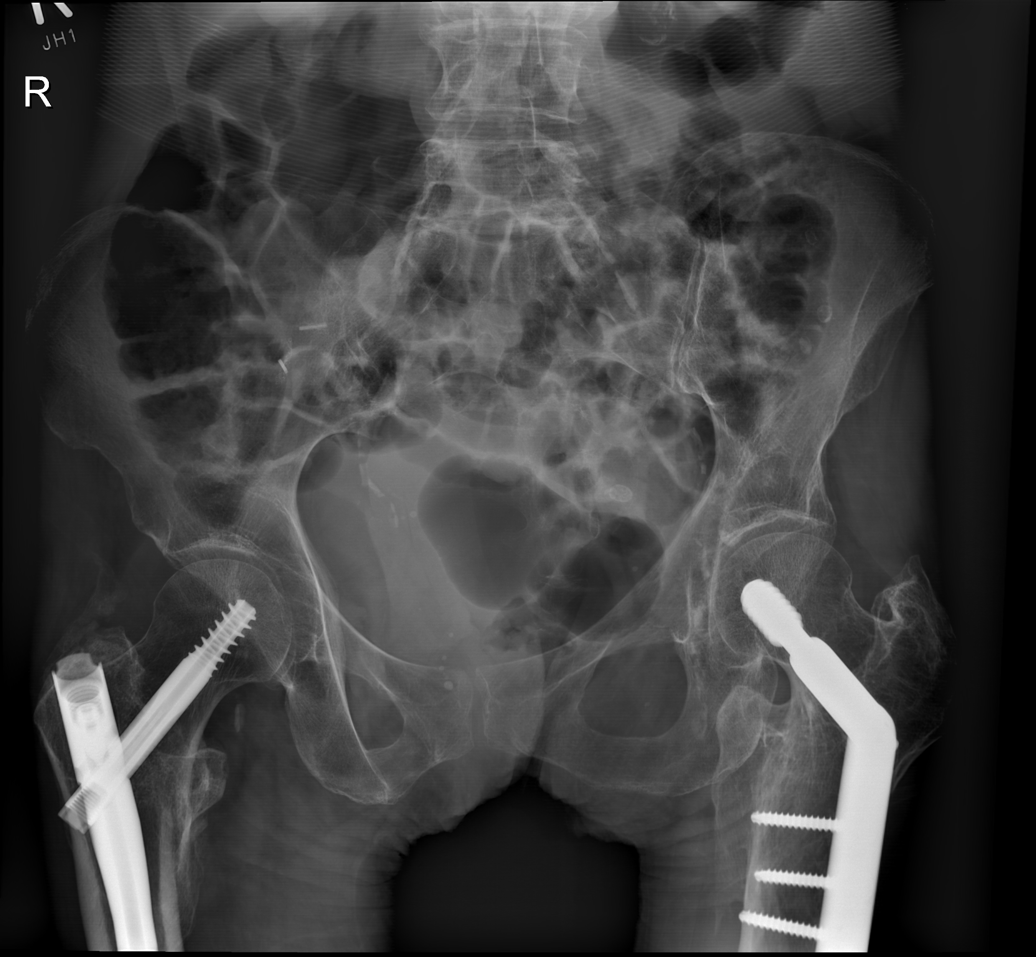

[x pelvis (2 of 2)]
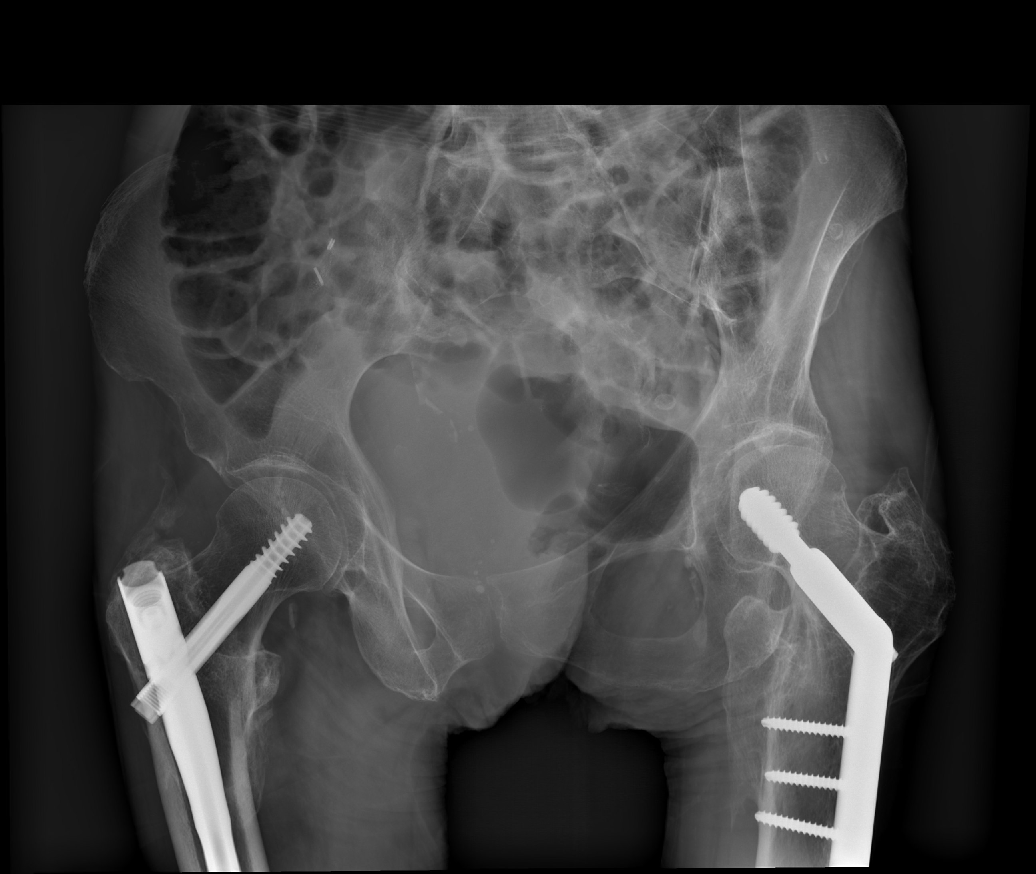

[x hip ap right]
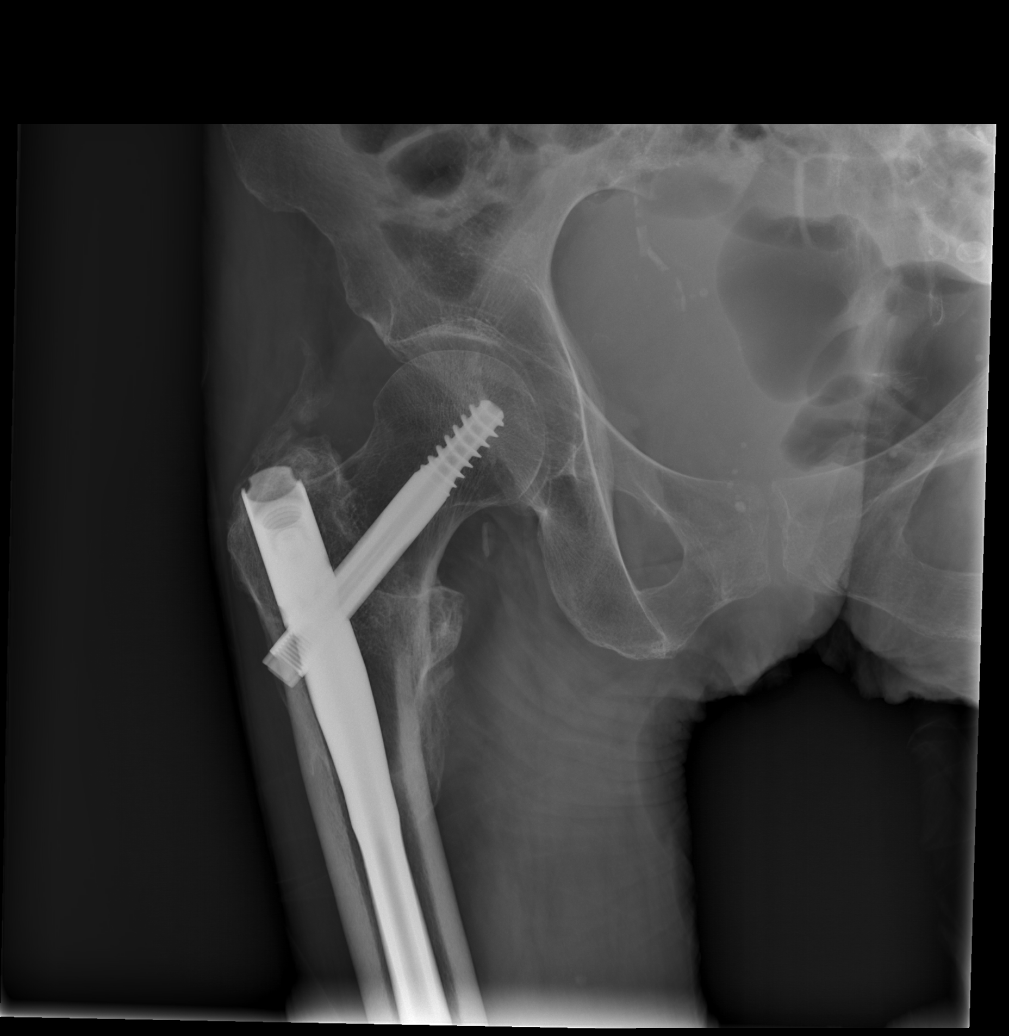

[x hip lat right]
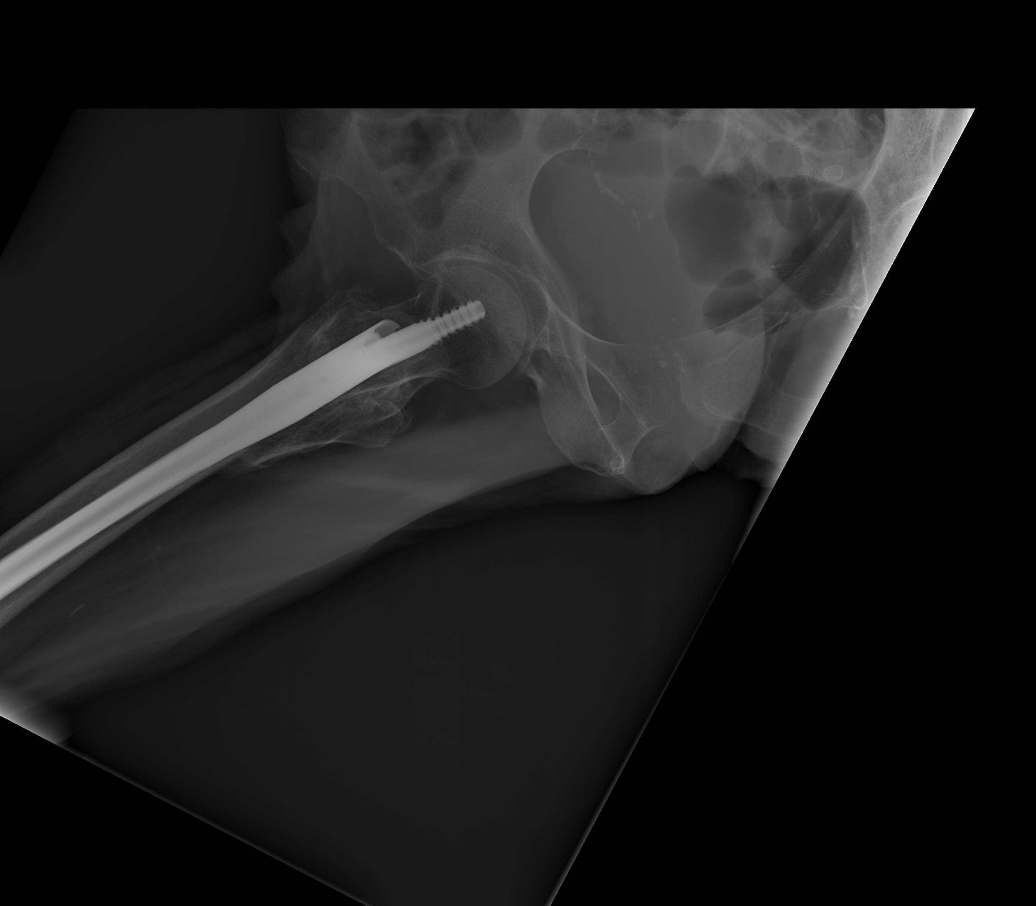

[4 of 4 positions shown; findings below may reference images not displayed]

FINDINGS: There is diffuse osteopenia. Hardware over the left femoral neck and
shaft intact and unchanged. Mild degenerate change of the hips.
Intramedullary nail with associated compression screw bridging the
right femoral neck into the right femoral head intact and normally
positioned. No evidence of acute fracture or dislocation. Minimal
degenerative change of the spine.
IMPRESSION: No acute findings.

Hardware intact over both femurs.

Diffuse osteopenia.

## 2019-04-27 IMAGING — CT CT ABD-PELV W/ CM
2 of 5 series · 15 of 46 positions shown, 17 images · IV contrast (ISOVUE)
Comparison: 02/28/2016, 10/21/2015 and 05/05/2015 CT.

CLINICAL DATA: 65-year-old female with nausea, vomiting and
diarrhea 3 weeks. Dark diarrhea. History of pancreatitis and
substance abuse. Renal cell carcinoma post ablation. Partial
gastrectomy. Initial encounter.

EXAM:
CT ABDOMEN AND PELVIS WITH CONTRAST
TECHNIQUE: Multidetector CT imaging of the abdomen and pelvis was performed
using the standard protocol following bolus administration of
intravenous contrast.
CONTRAST:  75 cc Isovue 300.

[Series 2: abd/pel with · axial · 0.59mm/px · z∈[+1322,+1662]mm · 12 of 78 slices shown, 14 images]
[im 5/78  soft-tissue]
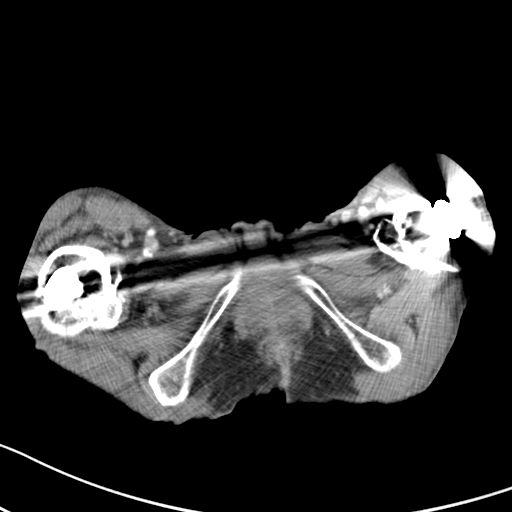
[im 5/78  bone]
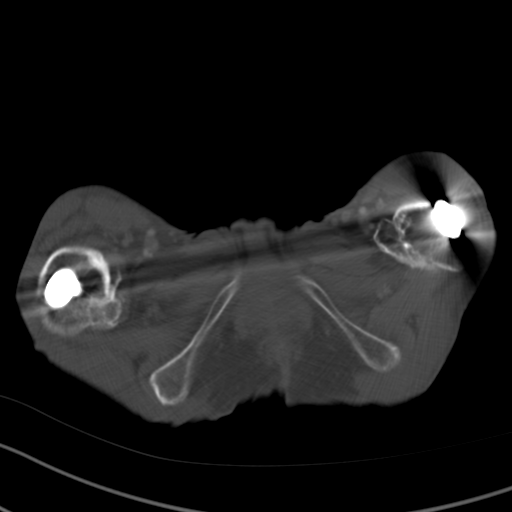
[im 10/78  soft-tissue]
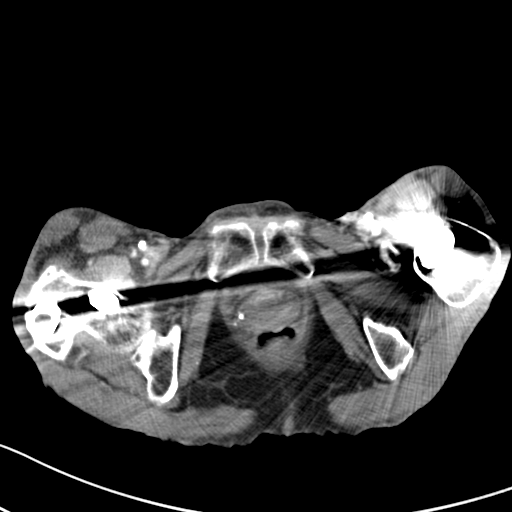
[im 20/78  soft-tissue]
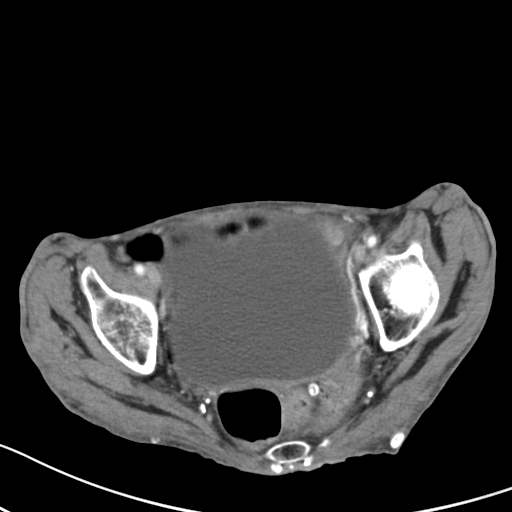
[im 25/78  soft-tissue]
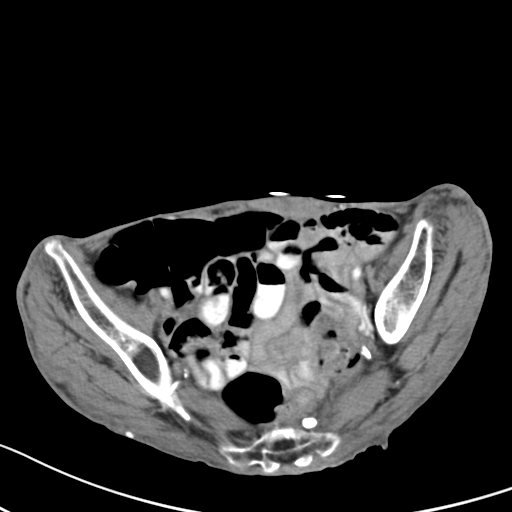
[im 29/78  soft-tissue]
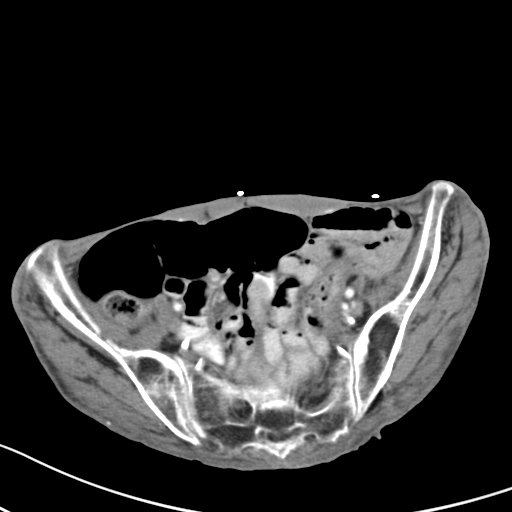
[im 34/78  soft-tissue]
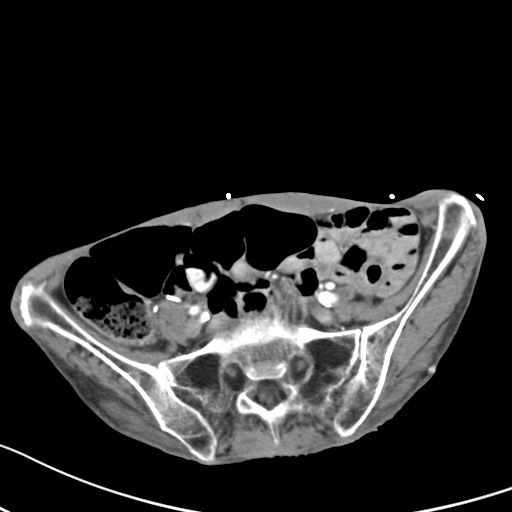
[im 44/78  soft-tissue]
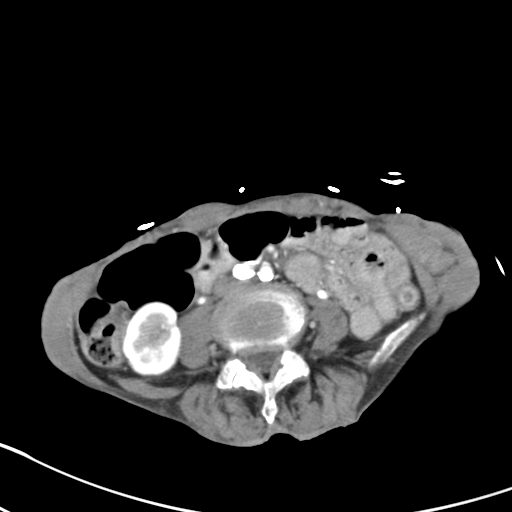
[im 49/78  soft-tissue]
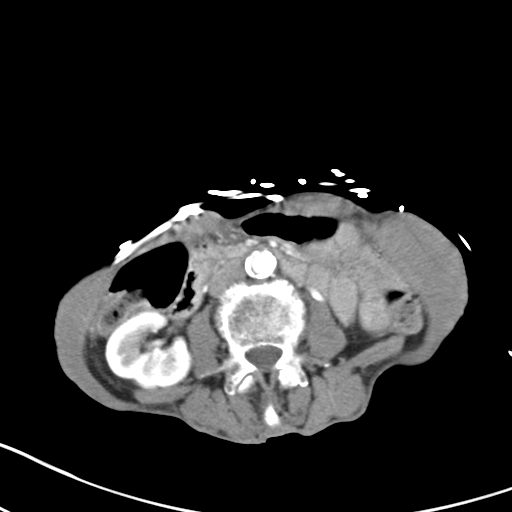
[im 53/78  soft-tissue]
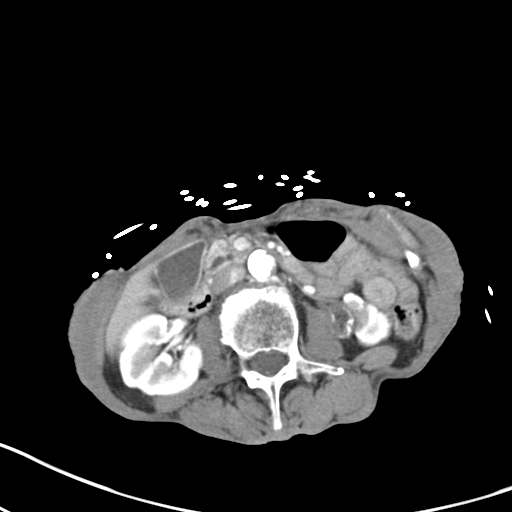
[im 53/78  bone]
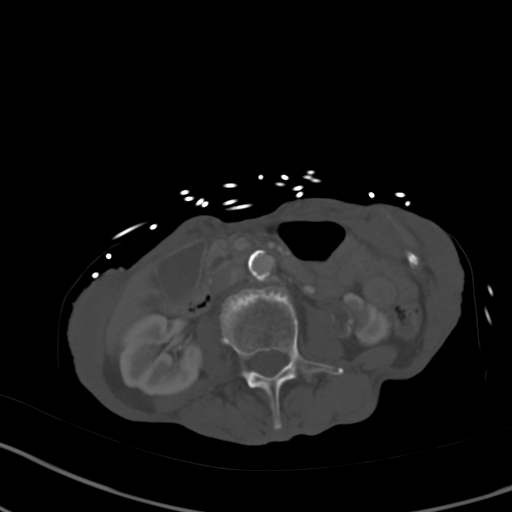
[im 58/78  soft-tissue]
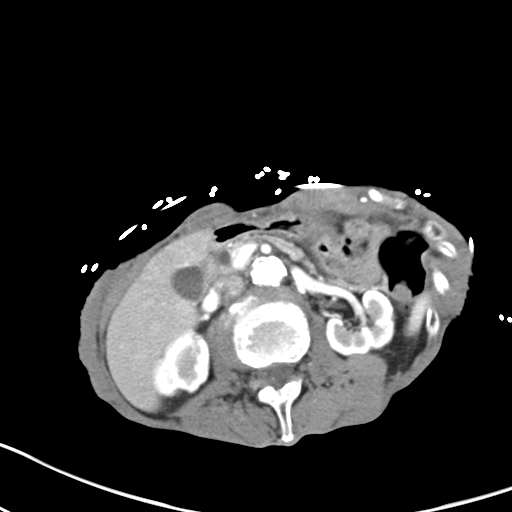
[im 68/78  soft-tissue]
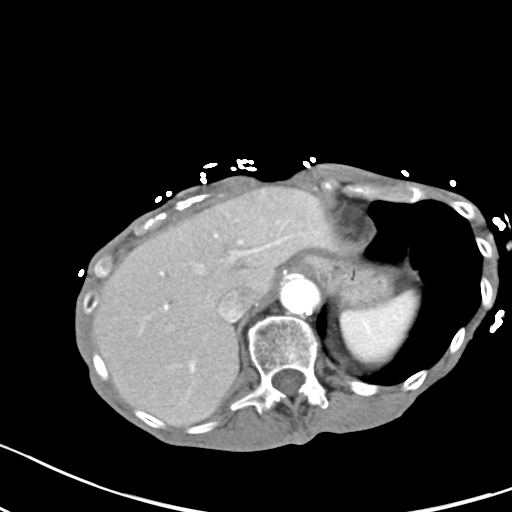
[im 73/78  soft-tissue]
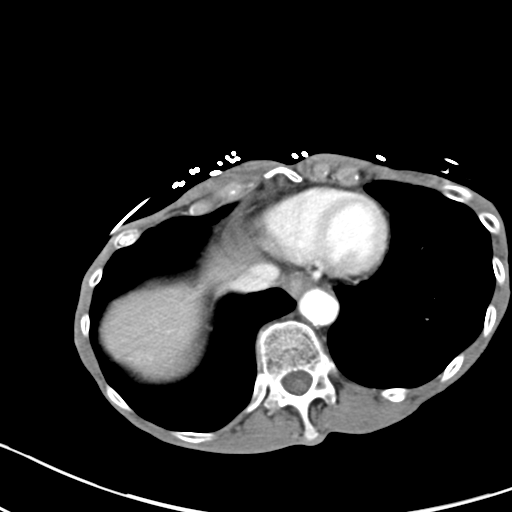

[Series 4: coronal a/|p · coronal · 0.59mm/px · 3 of 93 slices shown]
[im 31/93  soft-tissue]
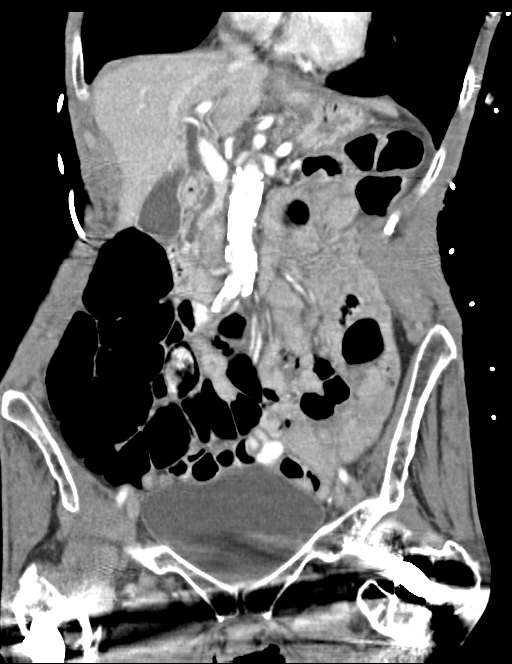
[im 41/93  soft-tissue]
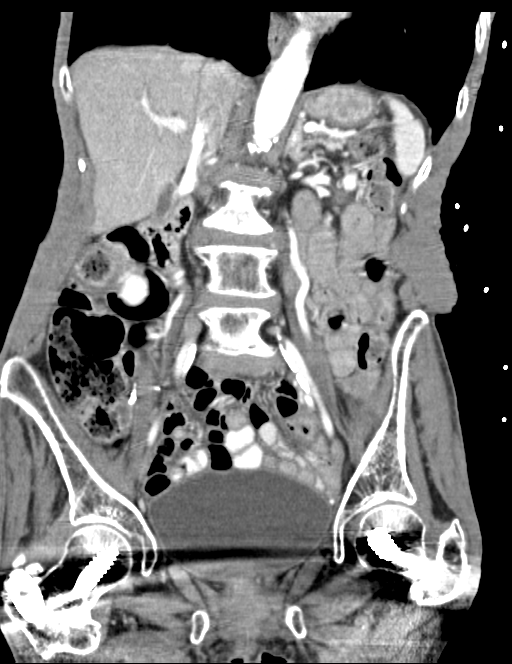
[im 52/93  soft-tissue]
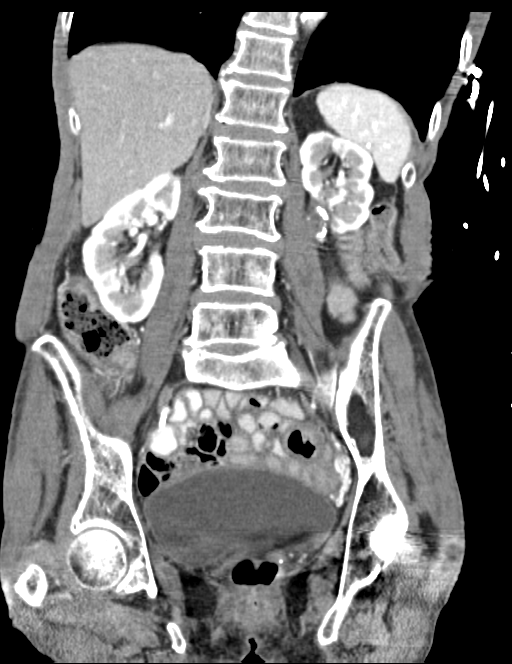

[15 of 46 positions shown; findings below may reference images not displayed]

FINDINGS: Lower chest: Scarring lung bases stable. Heart size within normal
limits.

Hepatobiliary: No focal hepatic lesion or calcified gallstones.
Stable mild prominence common bile ducts without calcified common
bile duct stone.

Pancreas: Chronically atrophic pancreas without mass identified. No
surrounding inflammation.

Spleen: No splenic mass or enlargement.

Adrenals/Urinary Tract: Calcified post ablation defect inferior
medial left kidney measuring up to 2.1 cm unchanged. No new renal
lesion or hydronephrosis. No adrenal lesion.

Evaluation of bladder limited by artifact from hip replacement. No
gross abnormality detected.

Stomach/Bowel: Segment of sigmoid colon under distended with
slightly thickened walls which may represent changes of mild
colitis. Additionally sigmoid colon filling defect may represent
stool but cannot exclude polypoid mass (series 4, image 64).

Evaluation of remainder bowel limited lack of fat planes and
distention. No free intraperitoneal air or drainable fluid
collection.

Vascular/Lymphatic: Prominent atherosclerotic changes aorta air with
ectasia without focal aneurysm. Narrowing origin of the celiac
artery, renal arteries and inferior mesenteric artery. Marked
narrowing iliac arteries bilaterally. Moderate narrowing femoral
arteries bilaterally.

No adenopathy.

Reproductive: No abnormality noted.  Possible prior hysterectomy.

Other: No bowel containing hernia.

Musculoskeletal: Prior bilateral hip surgery with hardware causing
artifact. Prominent degenerative changes L5-S1. Bulge is L1-2
through L5-S1. No lytic destructive lesion.
IMPRESSION: Segment of sigmoid colon under distended with slightly thickened
walls which may represent changes of mild colitis. Additionally
sigmoid colon filling defect may represent stool but cannot exclude
polypoid mass (series 4, image 64).

Calcified post ablation defect inferior medial left kidney measuring
up to 2.1 cm unchanged.

Aortic Atherosclerosis (MOA6W-OLJ.J). Narrowing origin of the celiac
artery, renal arteries and inferior mesenteric artery. Marked
narrowing iliac arteries bilaterally. Moderate narrowing femoral
arteries bilaterally.

Stable mild prominence common bile ducts without evidence of
calcified common bile duct stone.

Stable atrophic pancreas.

## 2019-05-30 IMAGING — CR DG HIP (WITH OR WITHOUT PELVIS) 2-3V*R*
3 series · 3 of 3 positions shown · non-contrast
Comparison: 10/08/2016 right lower extremity radiographs

CLINICAL DATA: 65 y/o  F; fall tonight with right hip pain.

EXAM:
DG HIP (WITH OR WITHOUT PELVIS) 2-3V RIGHT

[t pelvis ap]
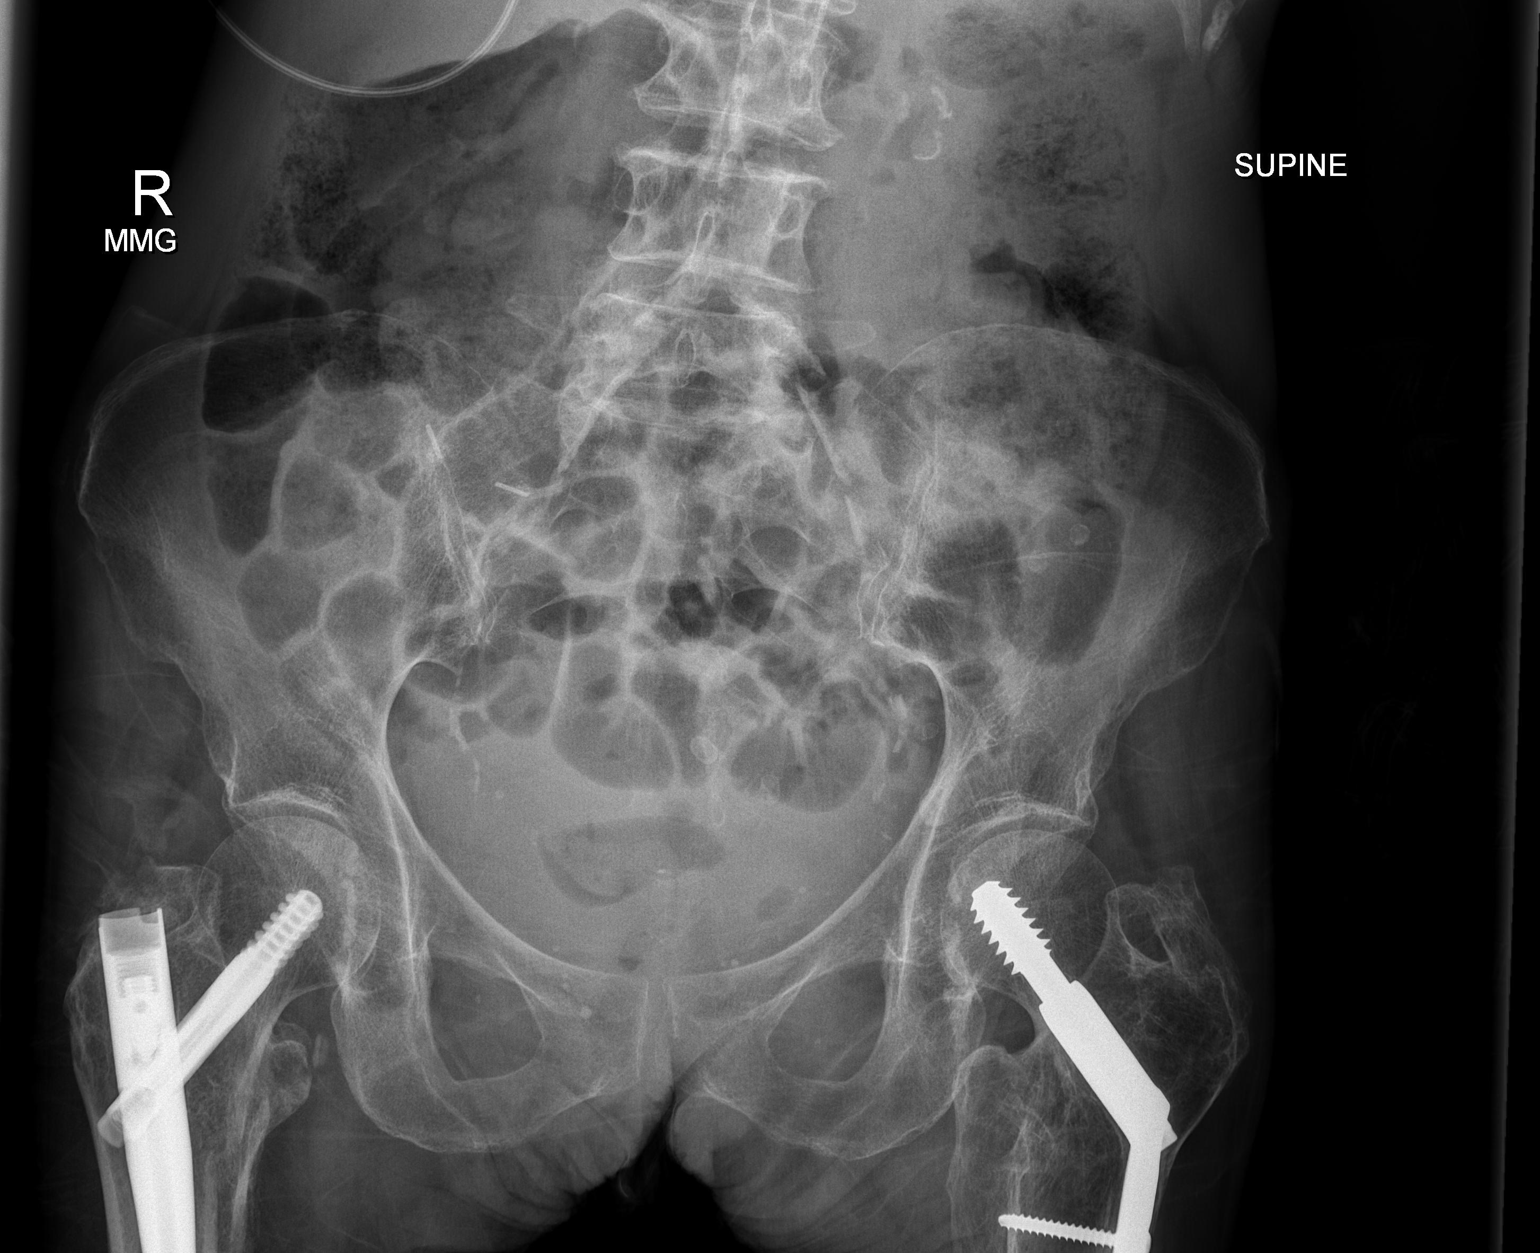

[t hip ap right]
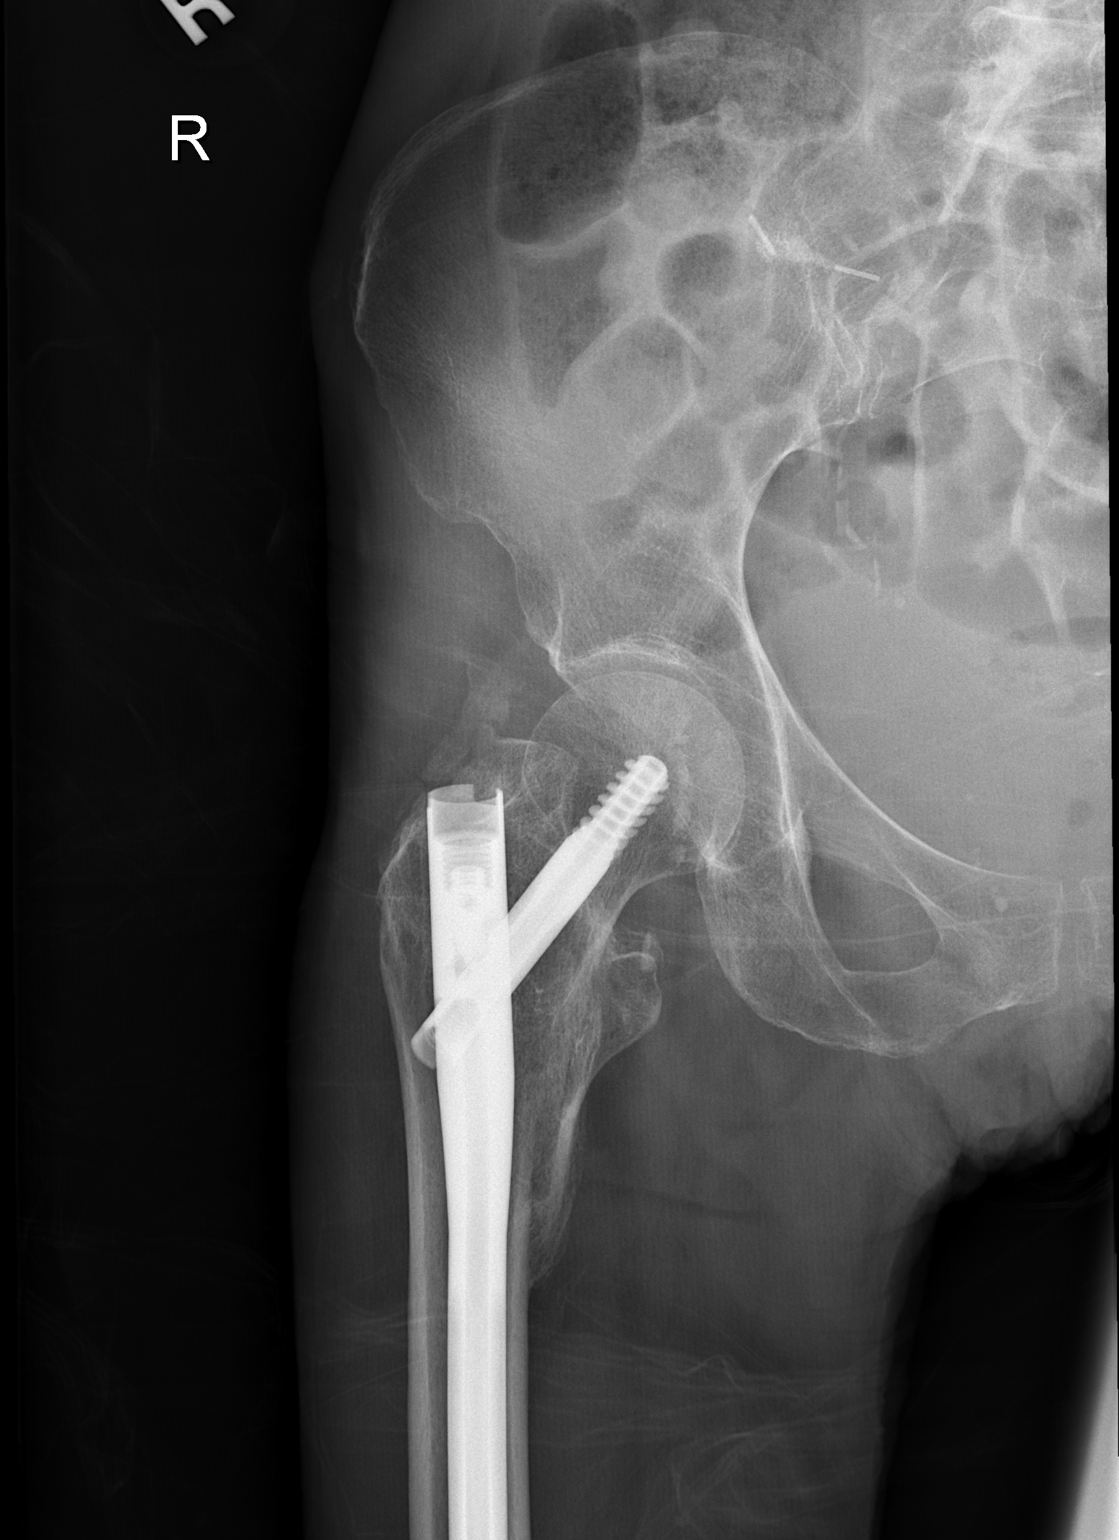

[t hip frog leg right]
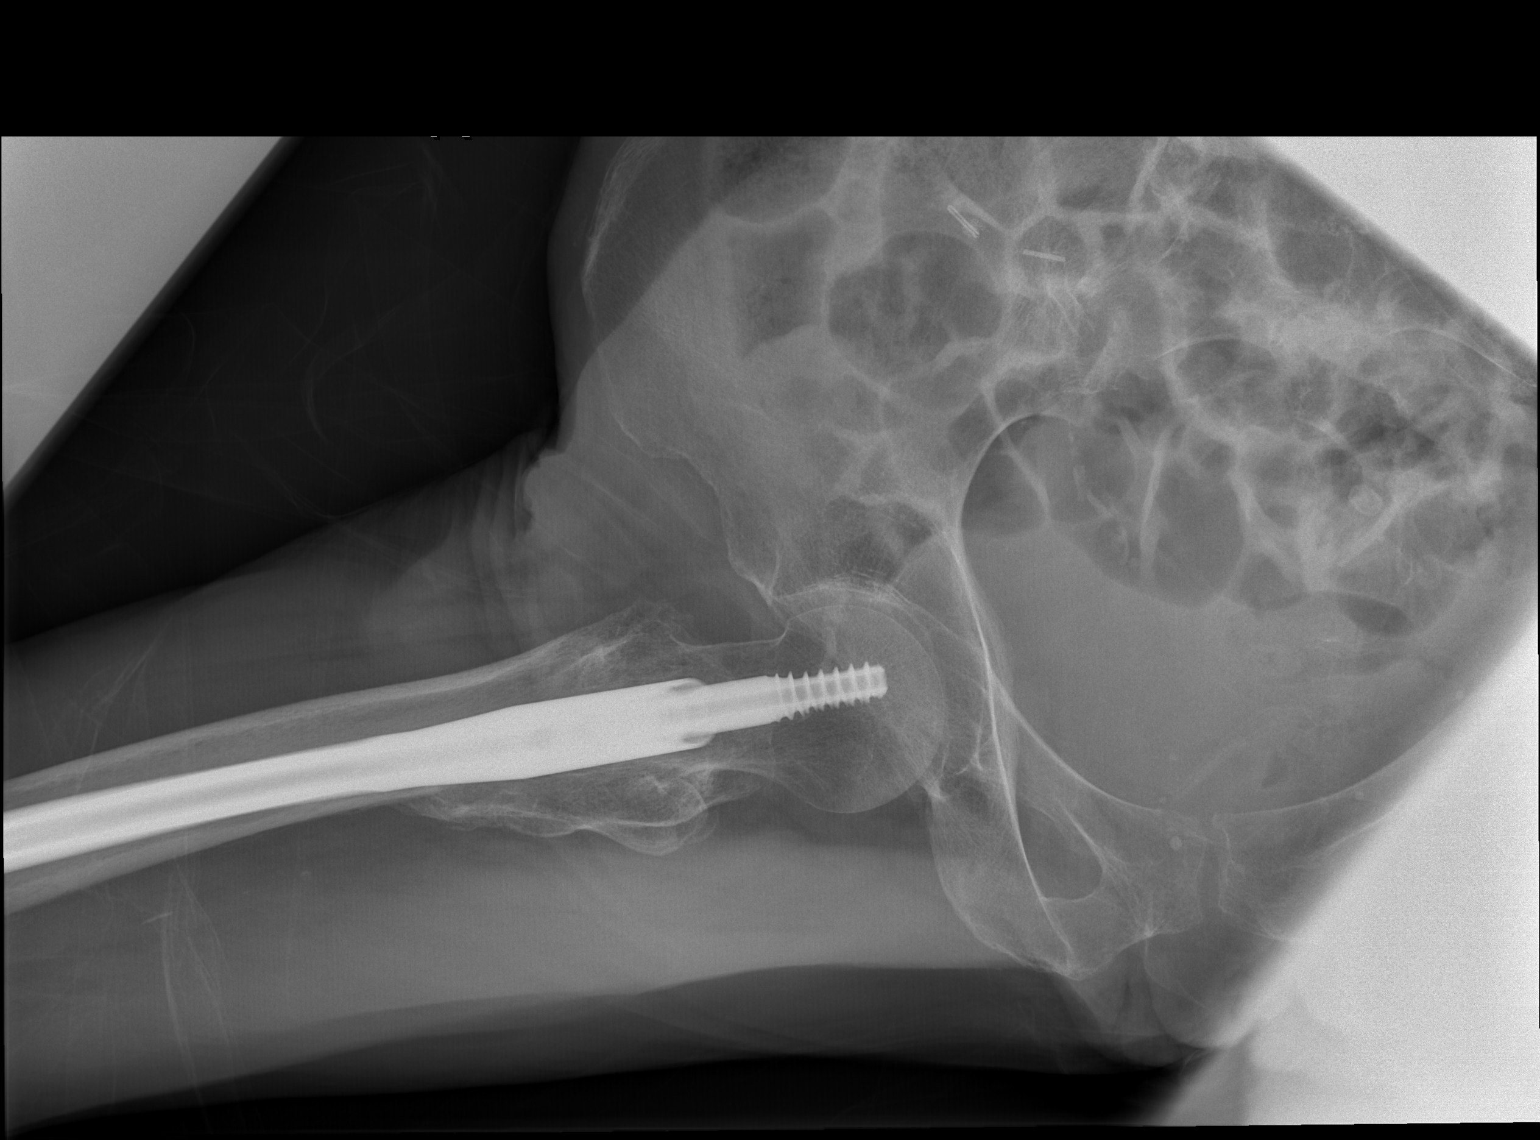

[3 of 3 positions shown; findings below may reference images not displayed]

FINDINGS: Chronic fracture deformities of the proximal femurs bilaterally with
right proximal nail fixation and lag screw and partially visualize
left proximal femur hardware. No right proximal femur fracture or
periprosthetic lucency is identified. No pelvic fracture or
diastases. Vascular calcifications.
IMPRESSION: No acute fracture or dislocation identified. Chronic fracture
fixation of proximal femurs bilaterally with partially visualized
hardware, no apparent hardware related complication.

By: Sandip Tiger M.D.

## 2019-07-30 IMAGING — CR DG CHEST 2V
2 series · 2 of 2 positions shown · non-contrast
Comparison: 04/19/2016, 02/28/2016

CLINICAL DATA: Abdominal pain and chest pain

EXAM:
CHEST  2 VIEW

[w chest pa]
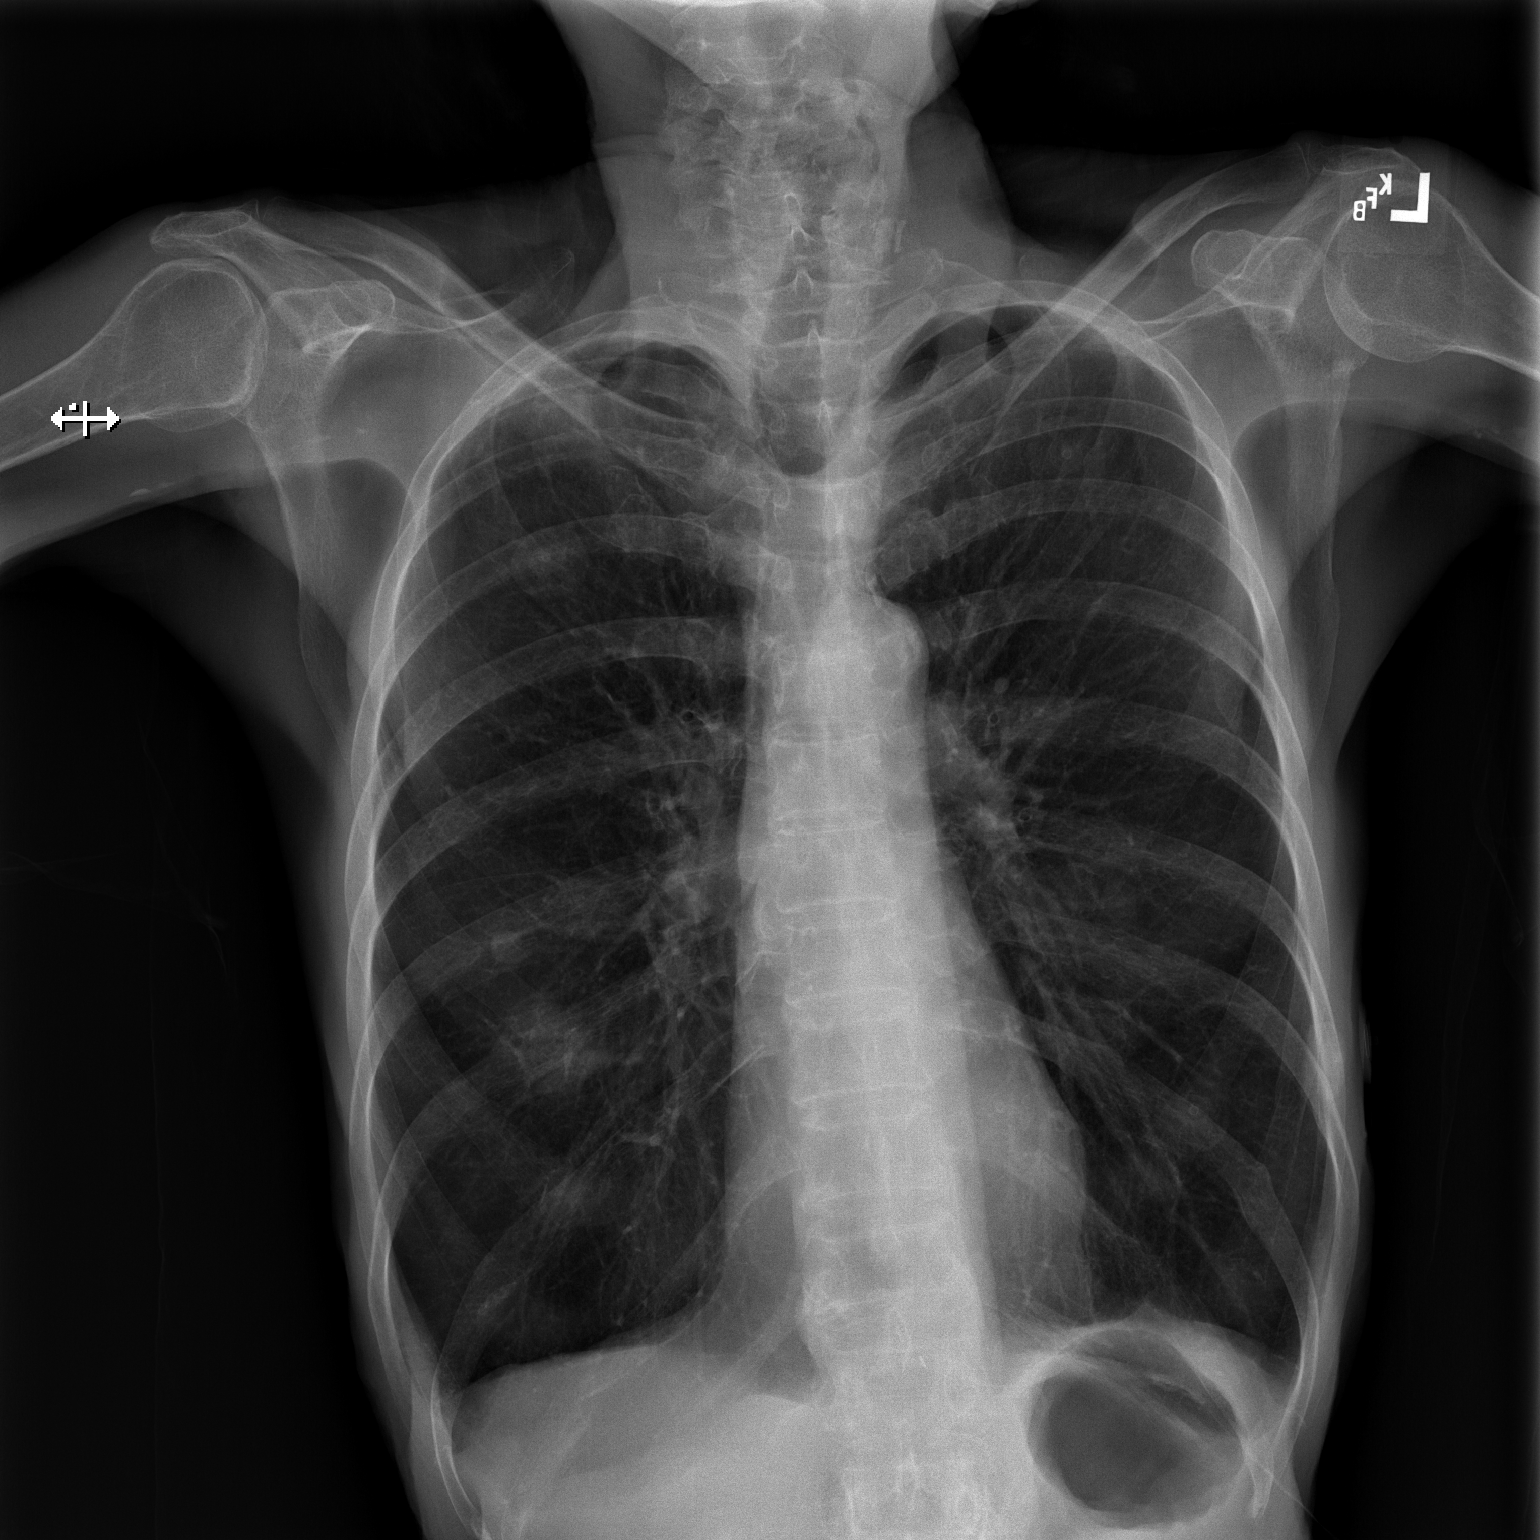

[w chest lat]
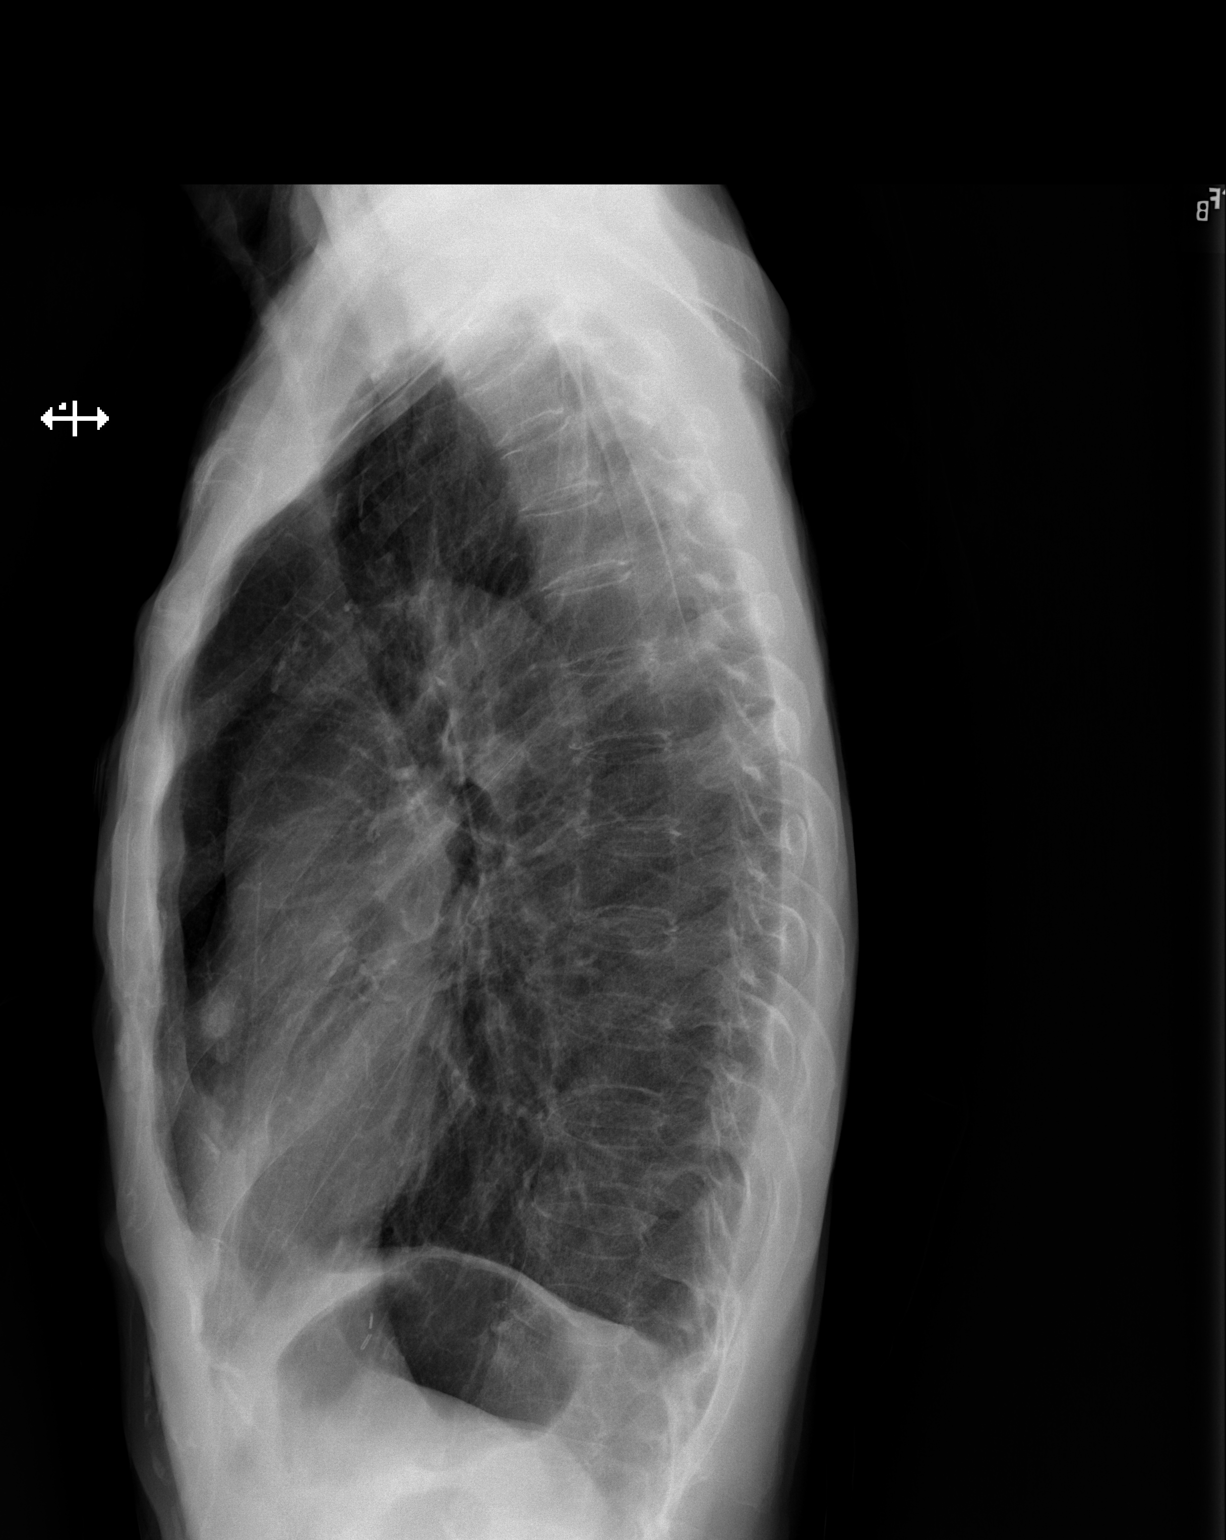

[2 of 2 positions shown; findings below may reference images not displayed]

FINDINGS: Hyperinflation. No acute consolidation or effusion. Normal heart
size. Aortic atherosclerosis.

Multiple vague nodular opacities in the right upper and mid to lower
lung. Old left-sided rib fractures. No pneumothorax
IMPRESSION: 1. No acute consolidation, pneumothorax or effusion
2. Vague nodular opacities in the right upper, mid and lower lung,
CT chest recommended to assess for possible lung parenchymal
abnormality/nodules or possible rib lesion.

## 2019-07-31 IMAGING — CT CT CHEST W/ CM
2 of 5 series · 13 of 46 positions shown, 15 images · IV contrast (ISOVUE)
Comparison: Chest radiograph dated 01/14/2017 and CT of the abdomen
pelvis dated 10/12/2016

CLINICAL DATA: 66-year-old female with chest and abdominal pain.
History of renal cancer and COPD.

EXAM:
CT CHEST, ABDOMEN, AND PELVIS WITH CONTRAST
TECHNIQUE: Multidetector CT imaging of the chest, abdomen and pelvis was
performed following the standard protocol during bolus
administration of intravenous contrast.
CONTRAST:  80mL [id number] IOPAMIDOL (ARQXJ4-C22) INJECTION 61%

[Series 2: axial st · axial · 0.70mm/px · z∈[-594,-64]mm · 10 of 128 slices shown, 12 images]
[im 11/128  soft-tissue]
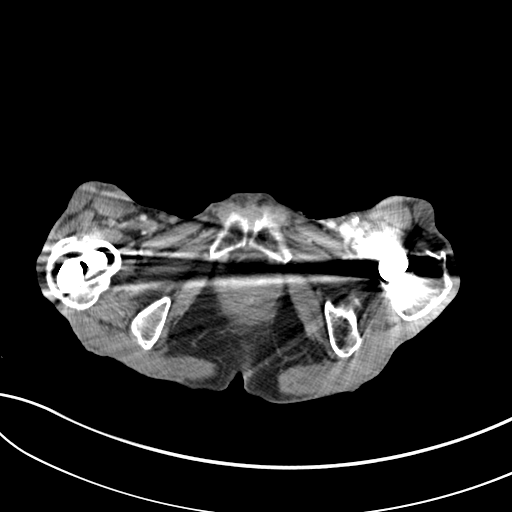
[im 11/128  bone]
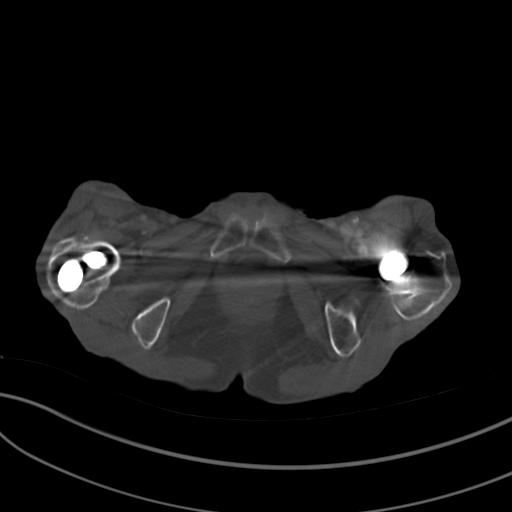
[im 22/128  soft-tissue]
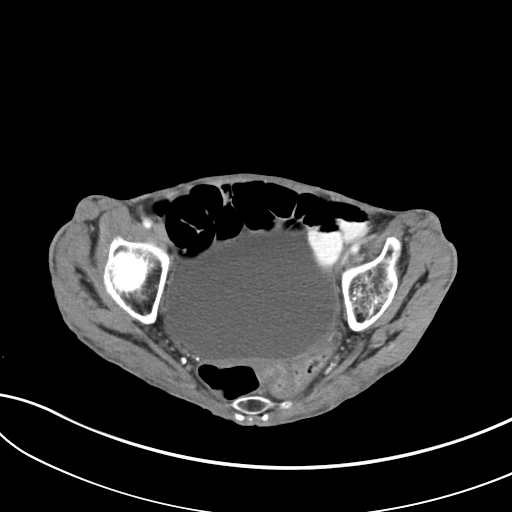
[im 32/128  soft-tissue]
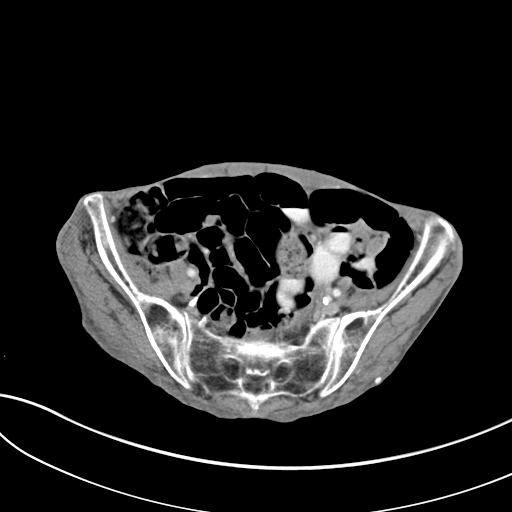
[im 43/128  soft-tissue]
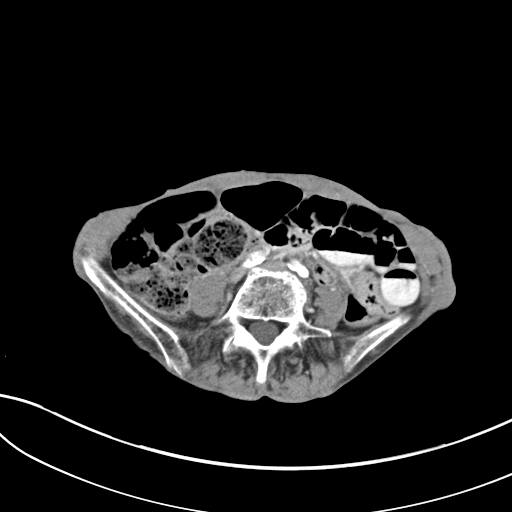
[im 53/128  soft-tissue]
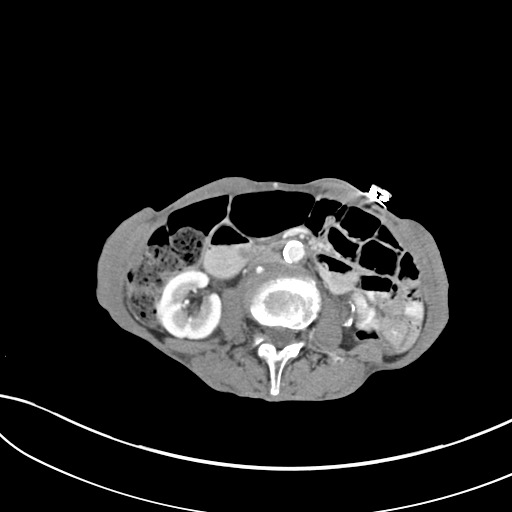
[im 75/128  soft-tissue]
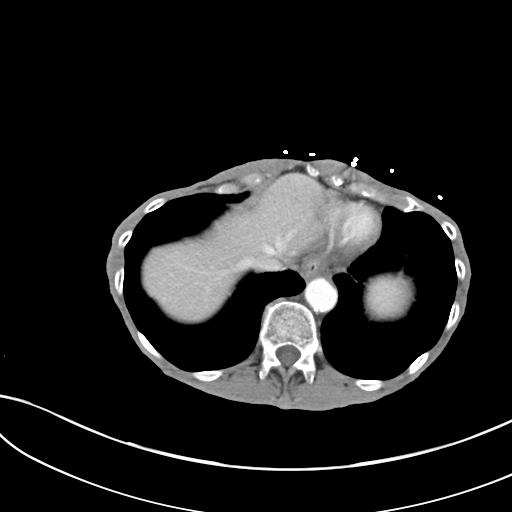
[im 85/128  soft-tissue]
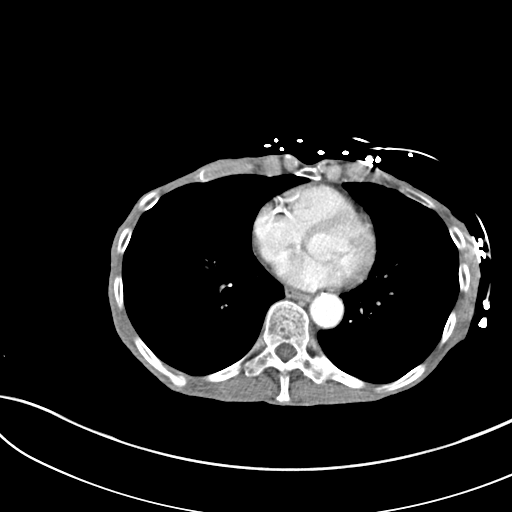
[im 96/128  soft-tissue]
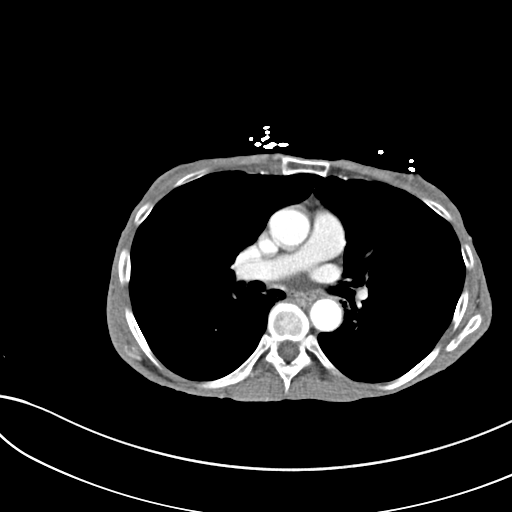
[im 106/128  soft-tissue]
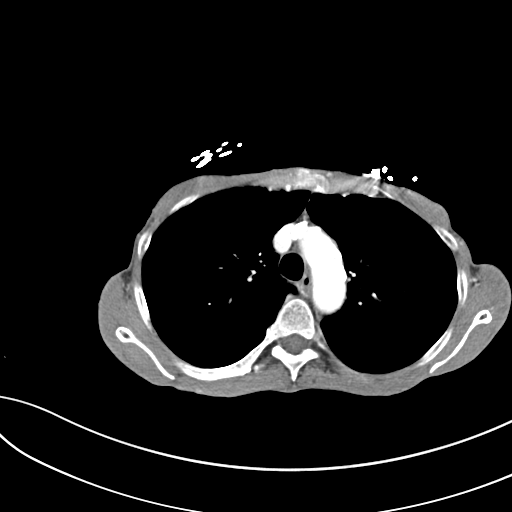
[im 106/128  bone]
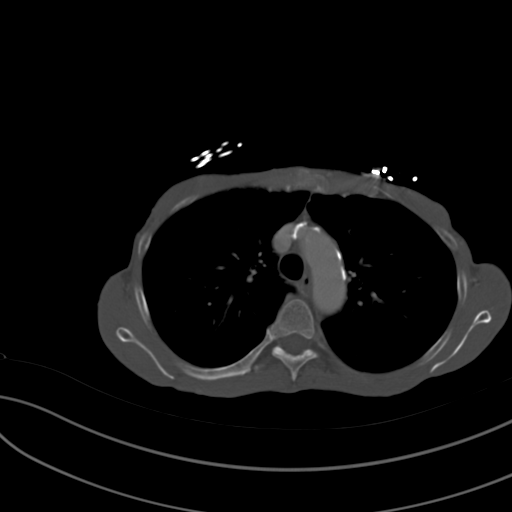
[im 117/128  soft-tissue]
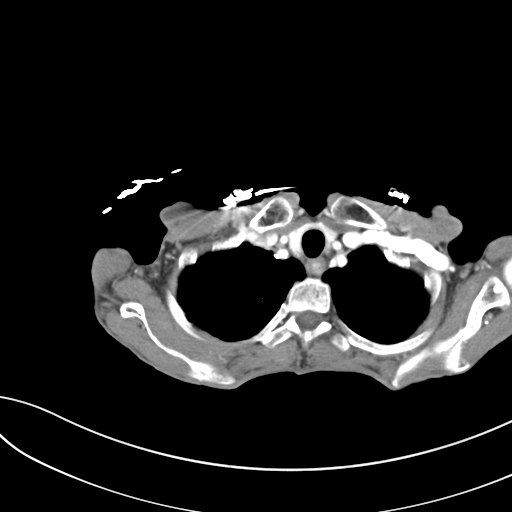

[Series 5: coronal st · coronal · 0.66mm/px · 3 of 63 slices shown]
[im 21/63  soft-tissue]
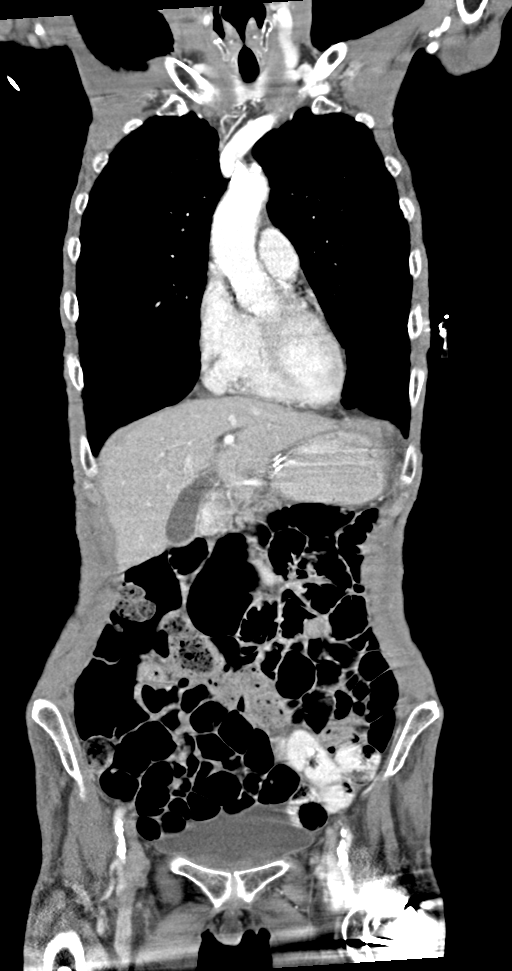
[im 28/63  soft-tissue]
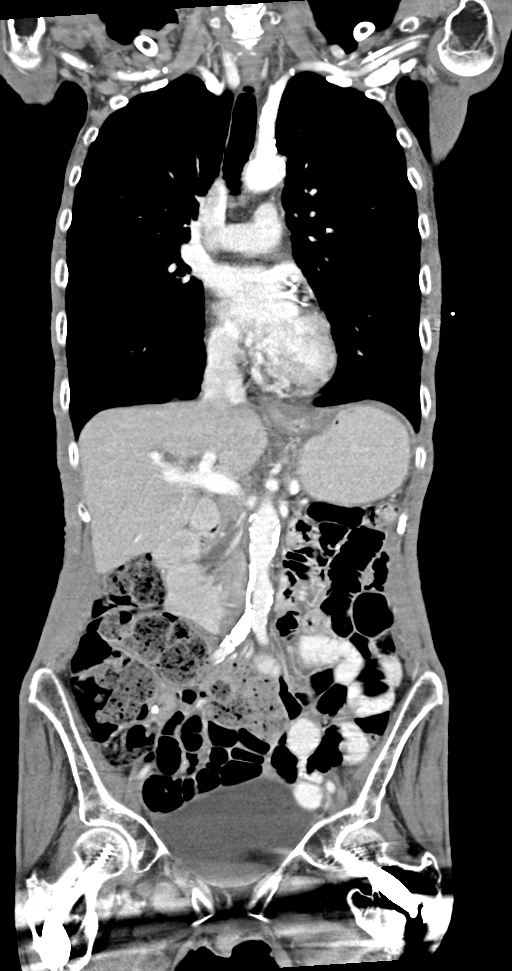
[im 35/63  soft-tissue]
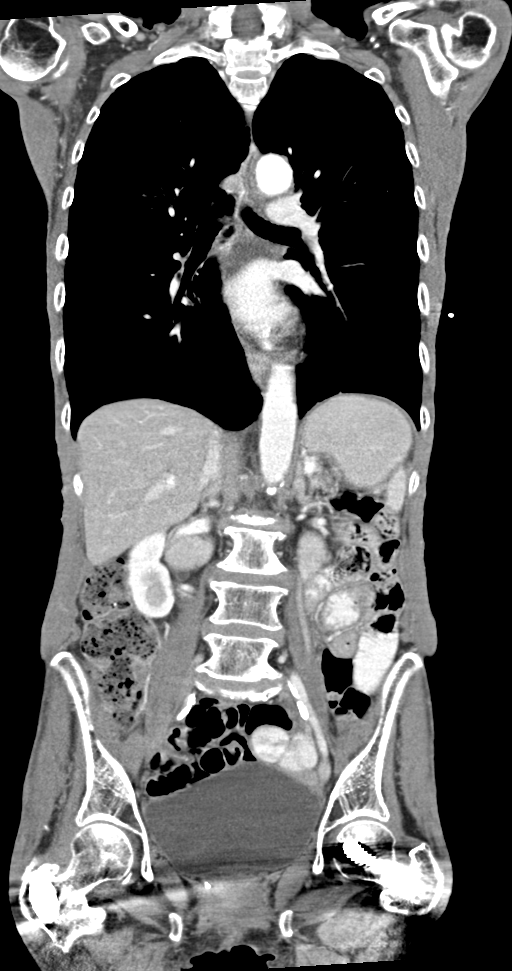

[13 of 46 positions shown; findings below may reference images not displayed]

FINDINGS: Evaluation is limited due to paucity of intra-abdominal fat and
cachexia.

CT CHEST FINDINGS

Cardiovascular: There is no cardiomegaly or pericardial effusion.
Multi vessel coronary vascular calcification. There is moderate
atherosclerotic calcification of the thoracic aorta. No aneurysmal
dilatation or evidence of dissection. There is atherosclerotic
calcification of the origins of the great vessels of the aortic
arch. The visualized origins of the great vessels of the aortic arch
are otherwise patent. The central pulmonary arteries are grossly
unremarkable.

Mediastinum/Nodes: There is no hilar or mediastinal adenopathy.
Esophagus and thyroid gland are grossly unremarkable. No mediastinal
fluid collection.

Lungs/Pleura: There is mild centrilobular emphysema. Multiple
scattered subpleural nodules in the left upper lobe. The largest
nodule in the left lower lobe posterior to the aorta measures 3 mm
(series 4, image 110).

Musculoskeletal: There is loss of subcutaneous fat and cachexia.
Degenerative changes of the spine. No acute osseous pathology. Old
right posterior ninth rib fracture.

CT ABDOMEN PELVIS FINDINGS

No intra-abdominal free air or free fluid.

Hepatobiliary: The liver is unremarkable. There is mild intrahepatic
biliary ductal dilatation similar to prior CT the common bile duct
is dilated measuring up to 16 mm. No calcified stone noted in the
central CBD. The appearance of the biliary tree and CBD is similar
to prior CT. The gallbladder is physiologically distended and
appears unremarkable.

Pancreas: The pancreas is atrophic and poorly visualized.

Spleen: Normal in size without focal abnormality.

Adrenals/Urinary Tract: The adrenal glands are unremarkable. Stable
appearing post ablation changes of the medial inferior pole of the
left kidney with dystrophic calcification. There is no
hydronephrosis on either side. The urinary bladder is distended
otherwise unremarkable.

Stomach/Bowel: There is moderate amount of stool throughout the
colon. Normal caliber air-filled loops of small and large bowel
noted. No evidence of bowel obstruction or active inflammation.

Vascular/Lymphatic: Advanced aortoiliac atherosclerotic disease. The
IVC is grossly unremarkable. No portal venous gas. There is no
adenopathy.

Reproductive: The uterus is not visualized, likely surgically
absent.

Other: None

Musculoskeletal: Osteopenia with degenerative changes of the spine.
Bilateral femoral fixation hardware. No acute fracture.
IMPRESSION: 1. No acute intrathoracic, abdominal, or pelvic pathology.
2. Emphysema and multiple small scattered nodules measuring up to 3
mm. No follow-up needed if patient is low-risk (and has no known or
suspected primary neoplasm). Non-contrast chest CT can be considered
in 12 months if patient is high-risk. This recommendation follows
the consensus statement: Guidelines for Management of Incidental
Pulmonary Nodules Detected on CT Images: From the [HOSPITAL]
3. No bowel obstruction or active inflammation.
4. Dilatation of the intrahepatic biliary tree and CBD grossly
similar to prior CT. No calcified stone.
5. Post ablation treatment of the inferior pole of the left kidney
with dystrophic calcification similar to prior CT.
6. Cachexia.
7. Aortic Atherosclerosis (DU8V4-GWH.H) and Emphysema ([id number].K).

## 2019-08-21 IMAGING — CR DG CHEST 2V
2 series · 2 of 2 positions shown · non-contrast
Comparison: Chest x-ray January 14, 2017. Chest CT January 15, 2017.

CLINICAL DATA: Chest tightness.  Abdominal pain.

EXAM:
CHEST  2 VIEW

[w chest lat]
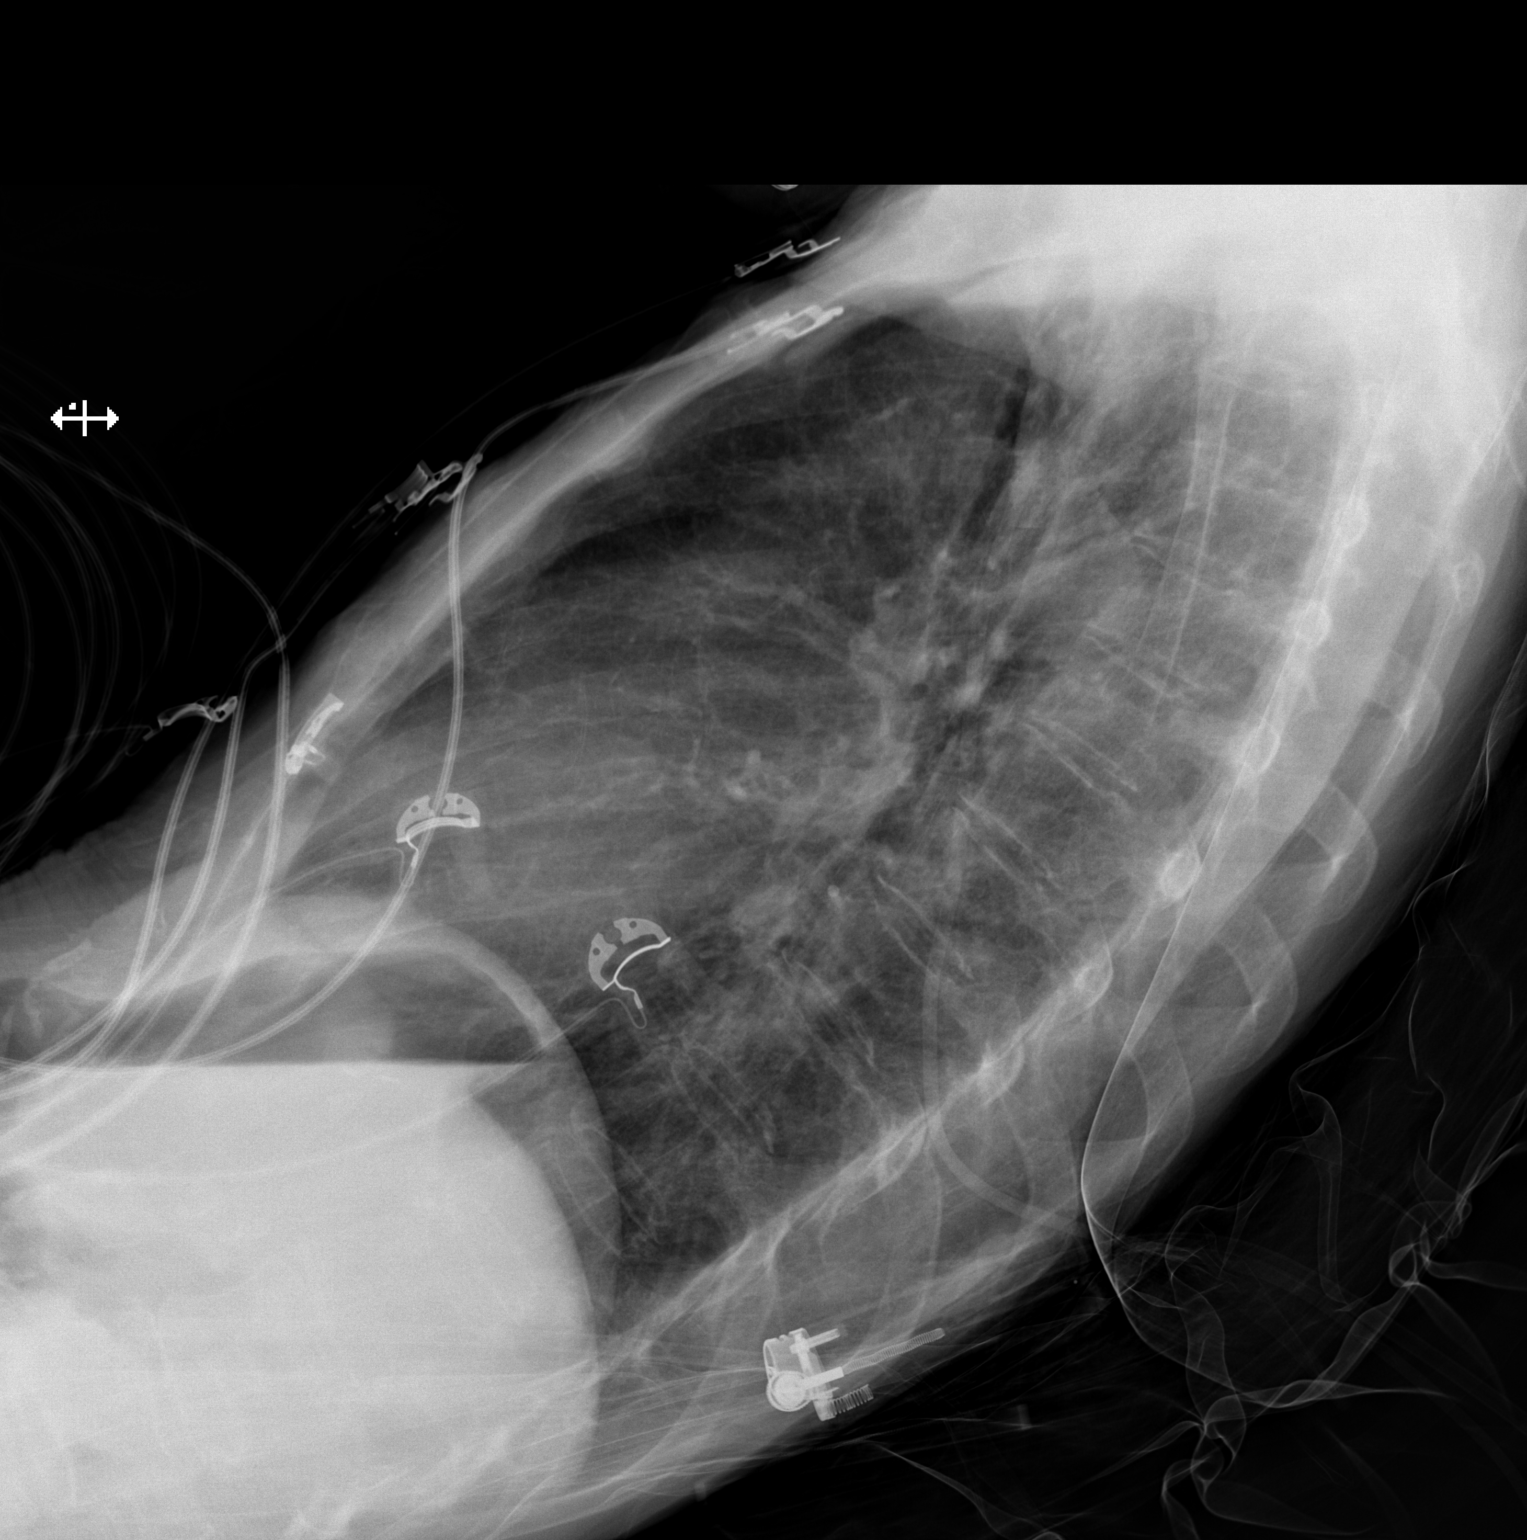

[x chest ap]
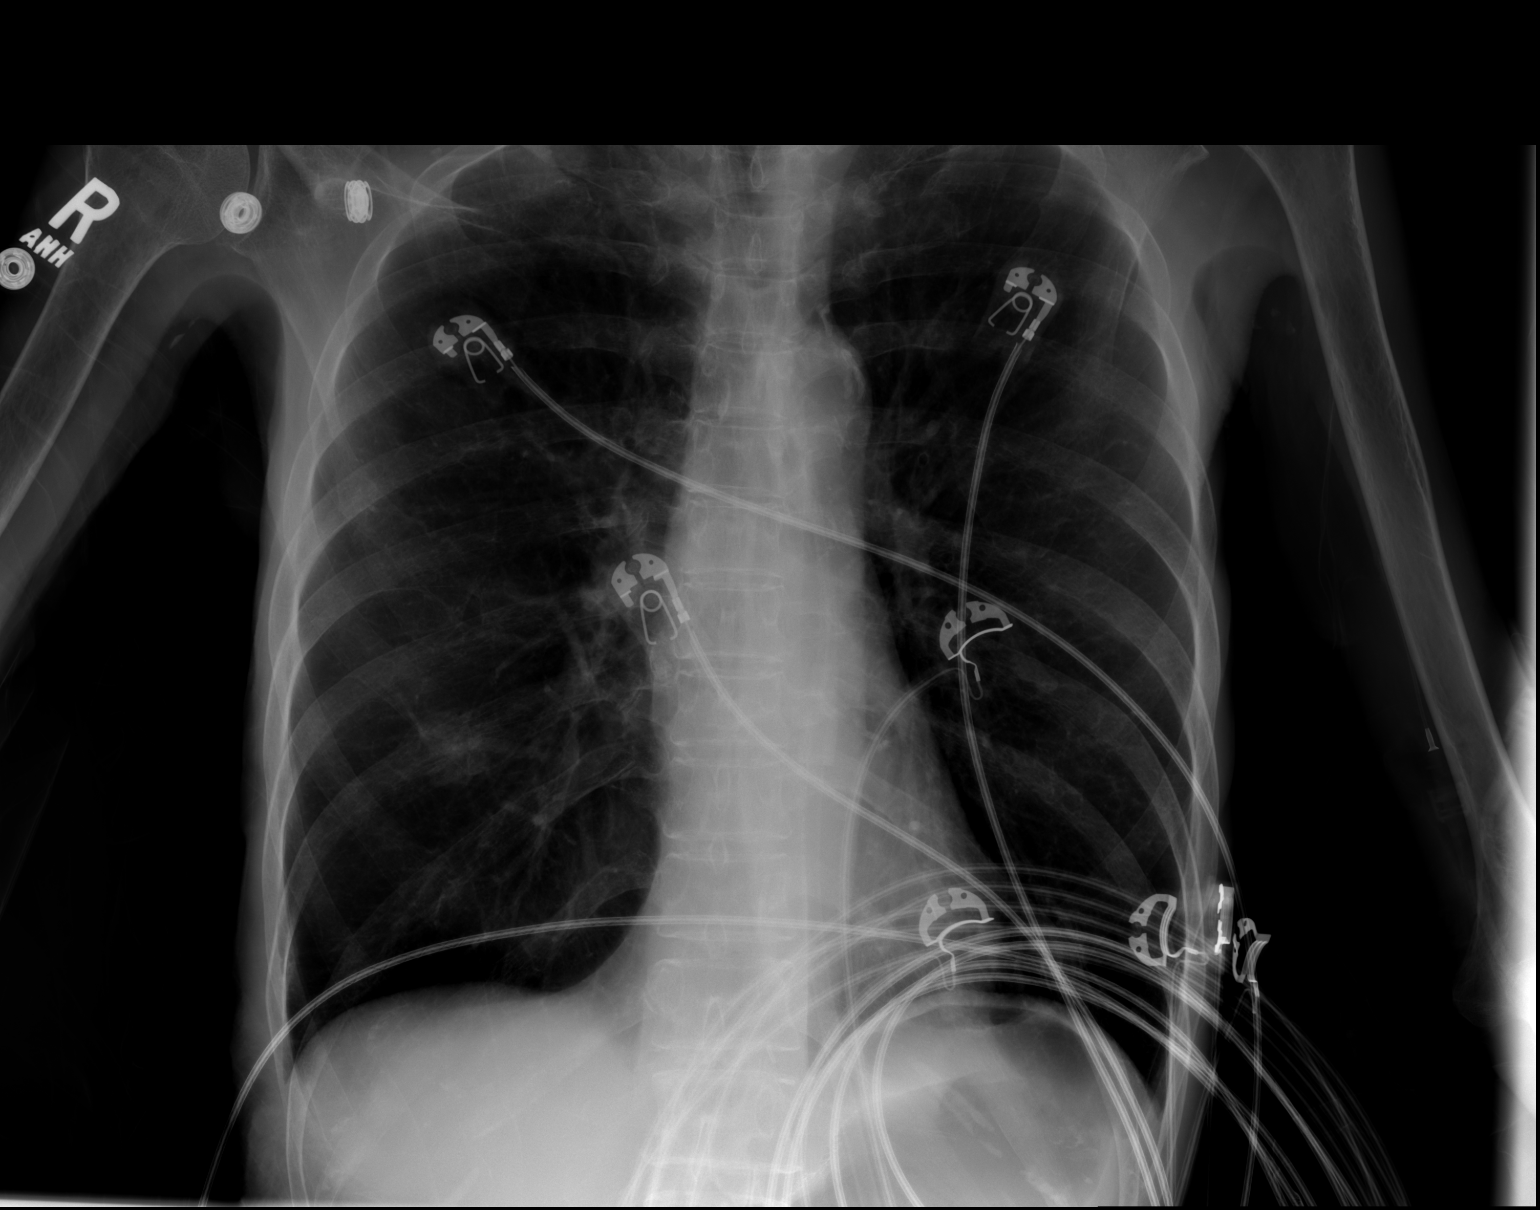

[2 of 2 positions shown; findings below may reference images not displayed]

FINDINGS: A healed posterior right rib fractures again identified. The heart,
hila, and mediastinum are unchanged. Evaluation of the lungs is
limited due to over penetration. Within these limitations, no
pneumothorax. No pulmonary nodule, mass, or focal infiltrate.
IMPRESSION: Limited study due to over penetration. No acute abnormalities
identified.

## 2019-08-24 IMAGING — DX DG CHEST 2V
2 series · 2 of 2 positions shown · non-contrast
Comparison: Chest radiograph performed 02/05/2017

CLINICAL DATA: Acute onset of central chest pain, shortness of
breath and nausea.

EXAM:
CHEST  2 VIEW

[chest lat]
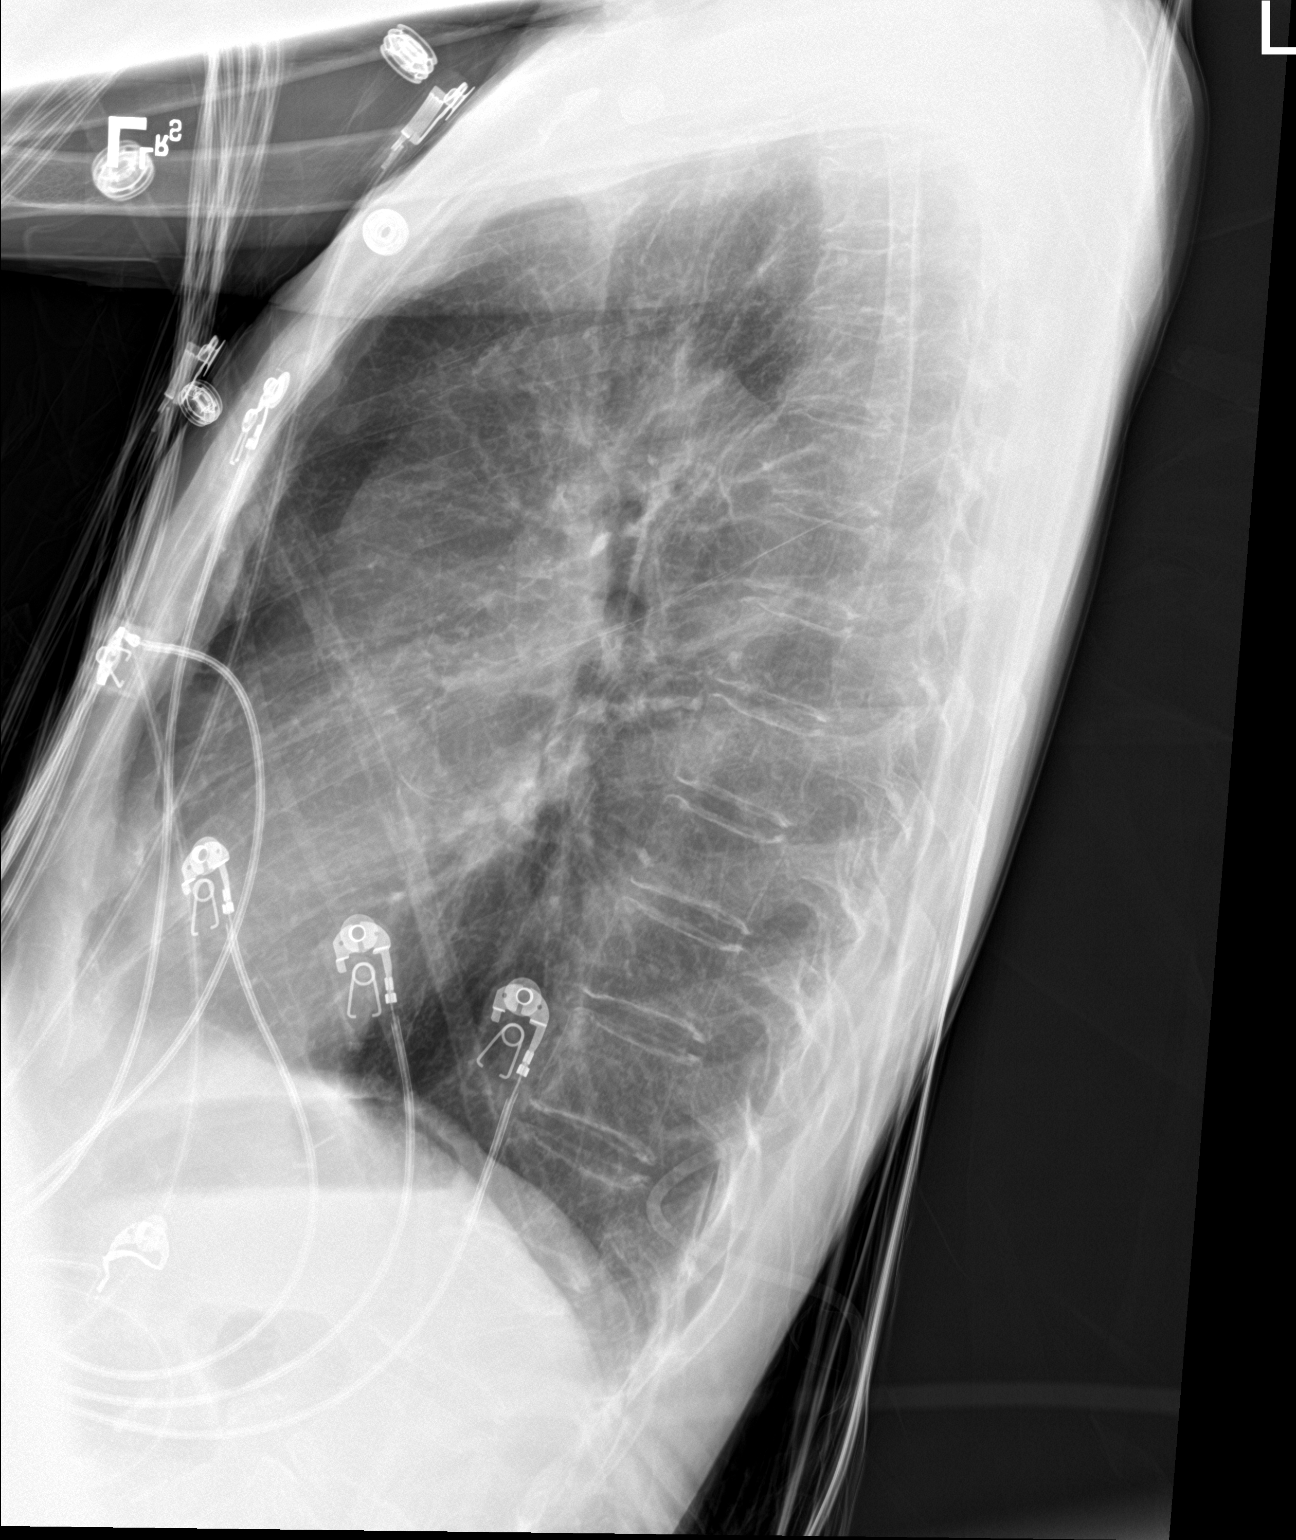

[chest ap]
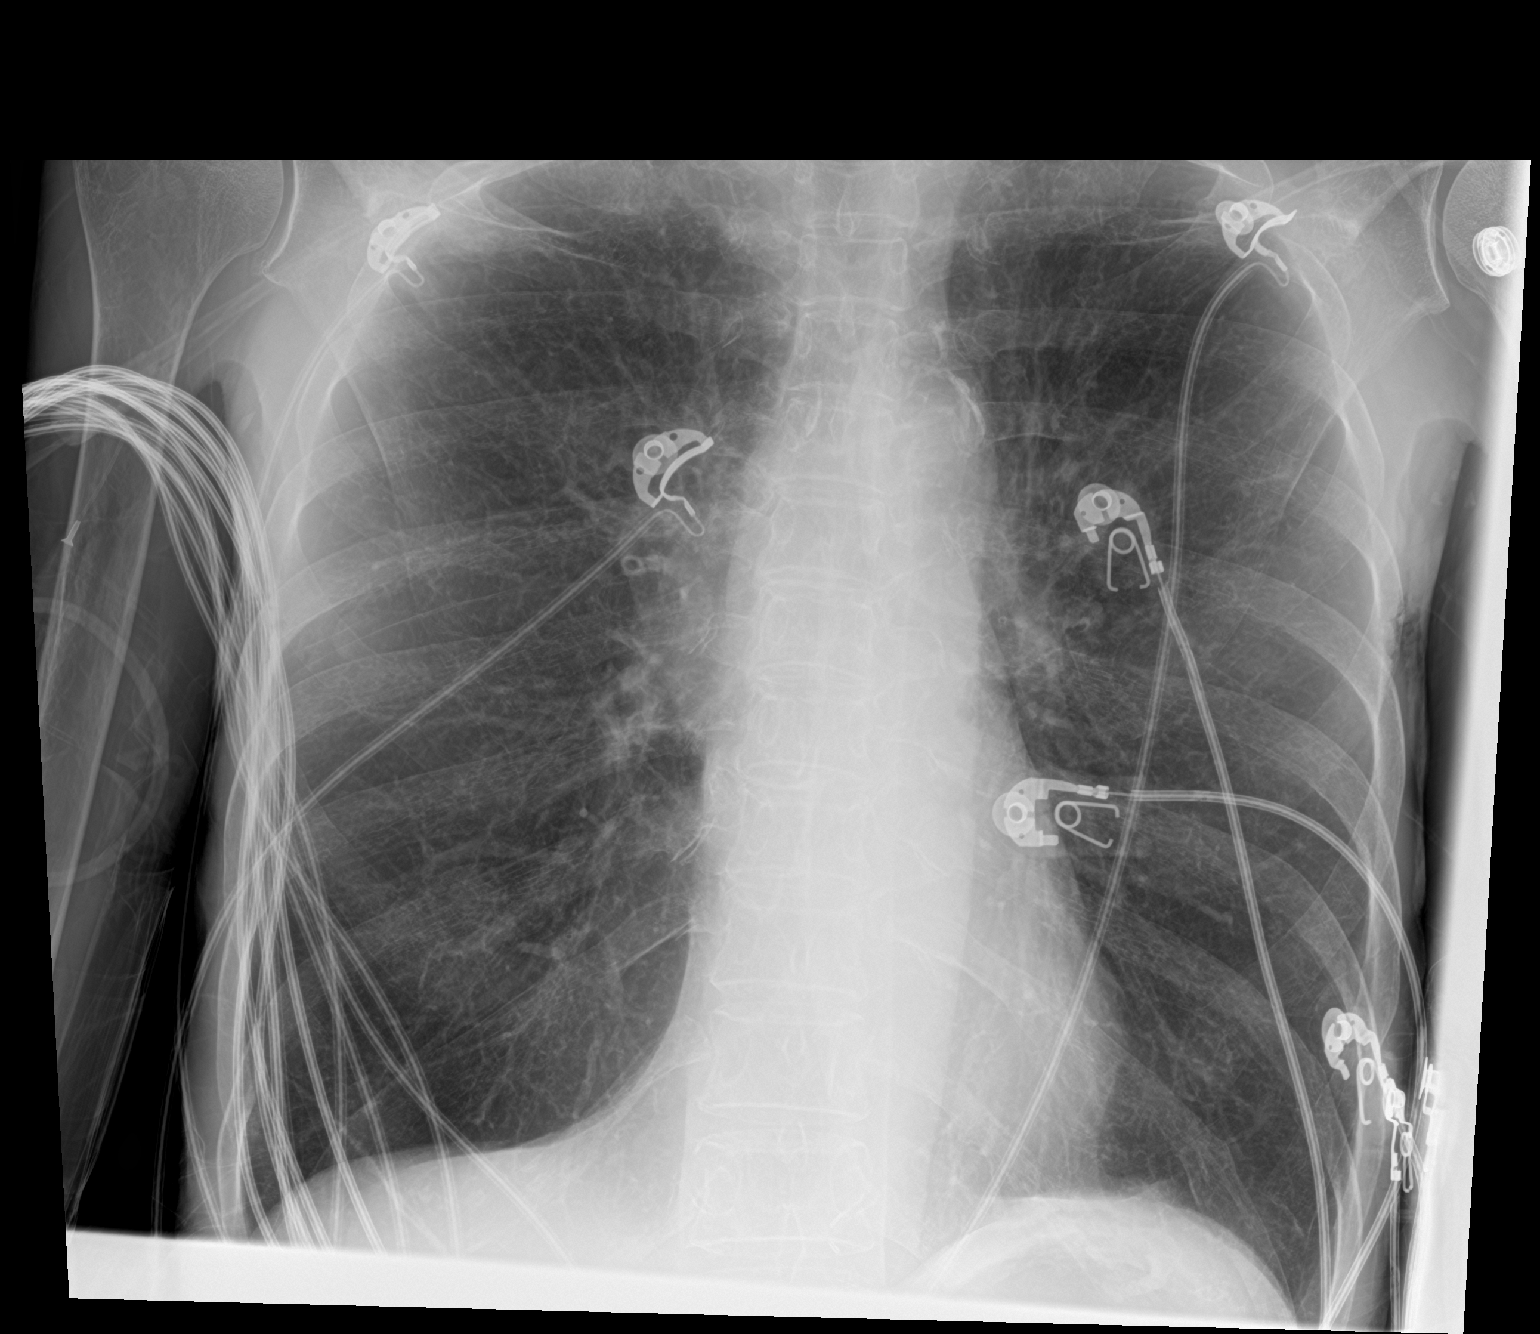

[2 of 2 positions shown; findings below may reference images not displayed]

FINDINGS: The lungs are hyperexpanded, with flattening of the hemidiaphragms,
compatible with emphysema. There is no evidence of focal
opacification, pleural effusion or pneumothorax.

The heart is normal in size; the mediastinal contour is within
normal limits. No acute osseous abnormalities are seen.
IMPRESSION: Emphysema noted.  No superimposed focal airspace consolidation seen.

## 2019-09-21 IMAGING — CR DG CHEST 2V
2 series · 2 of 2 positions shown · non-contrast
Comparison: Radiographs 02/08/2017.  CT 01/15/2017

CLINICAL DATA: Chest pain and shortness of breath.

EXAM:
CHEST - 2 VIEW

[chest lat]
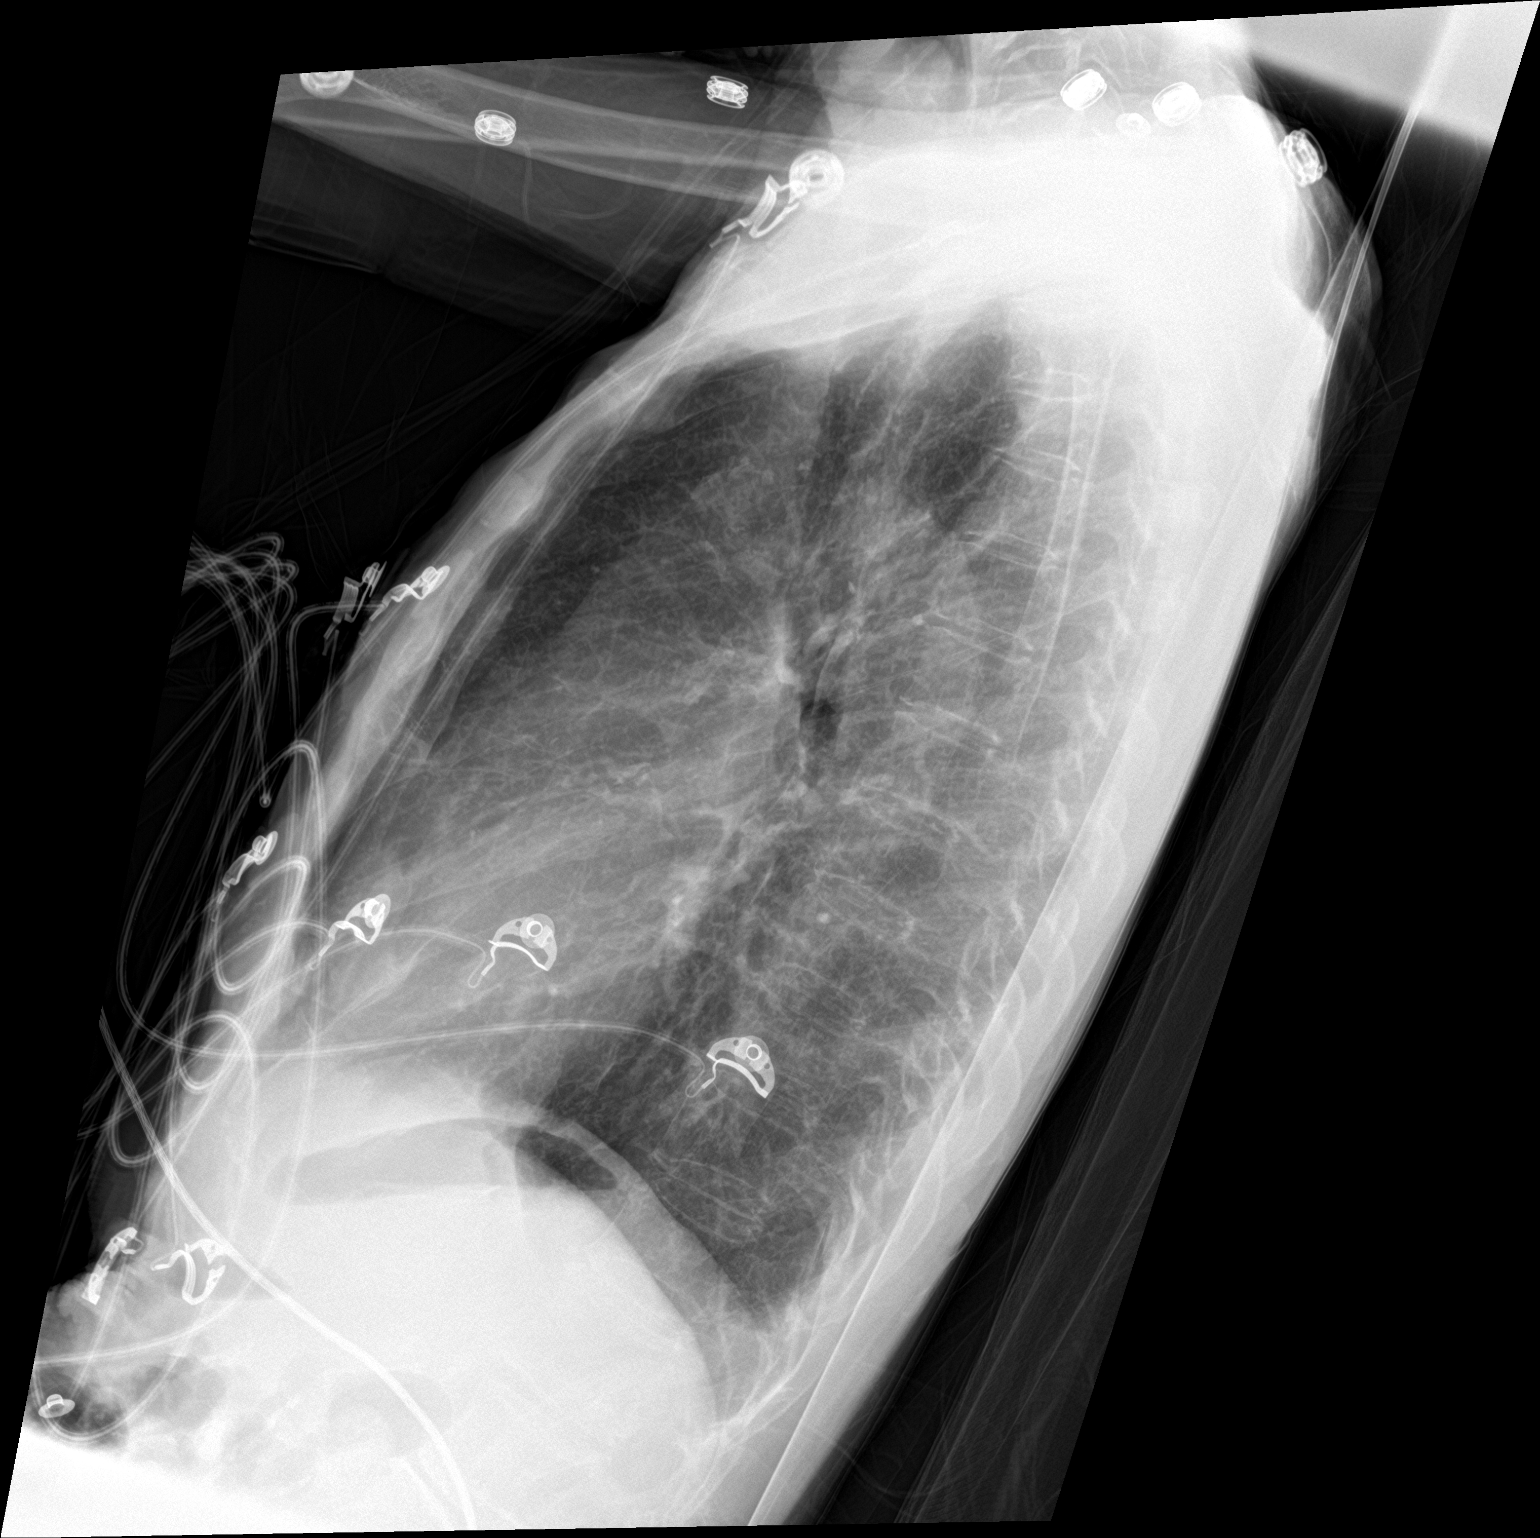

[chest ap]
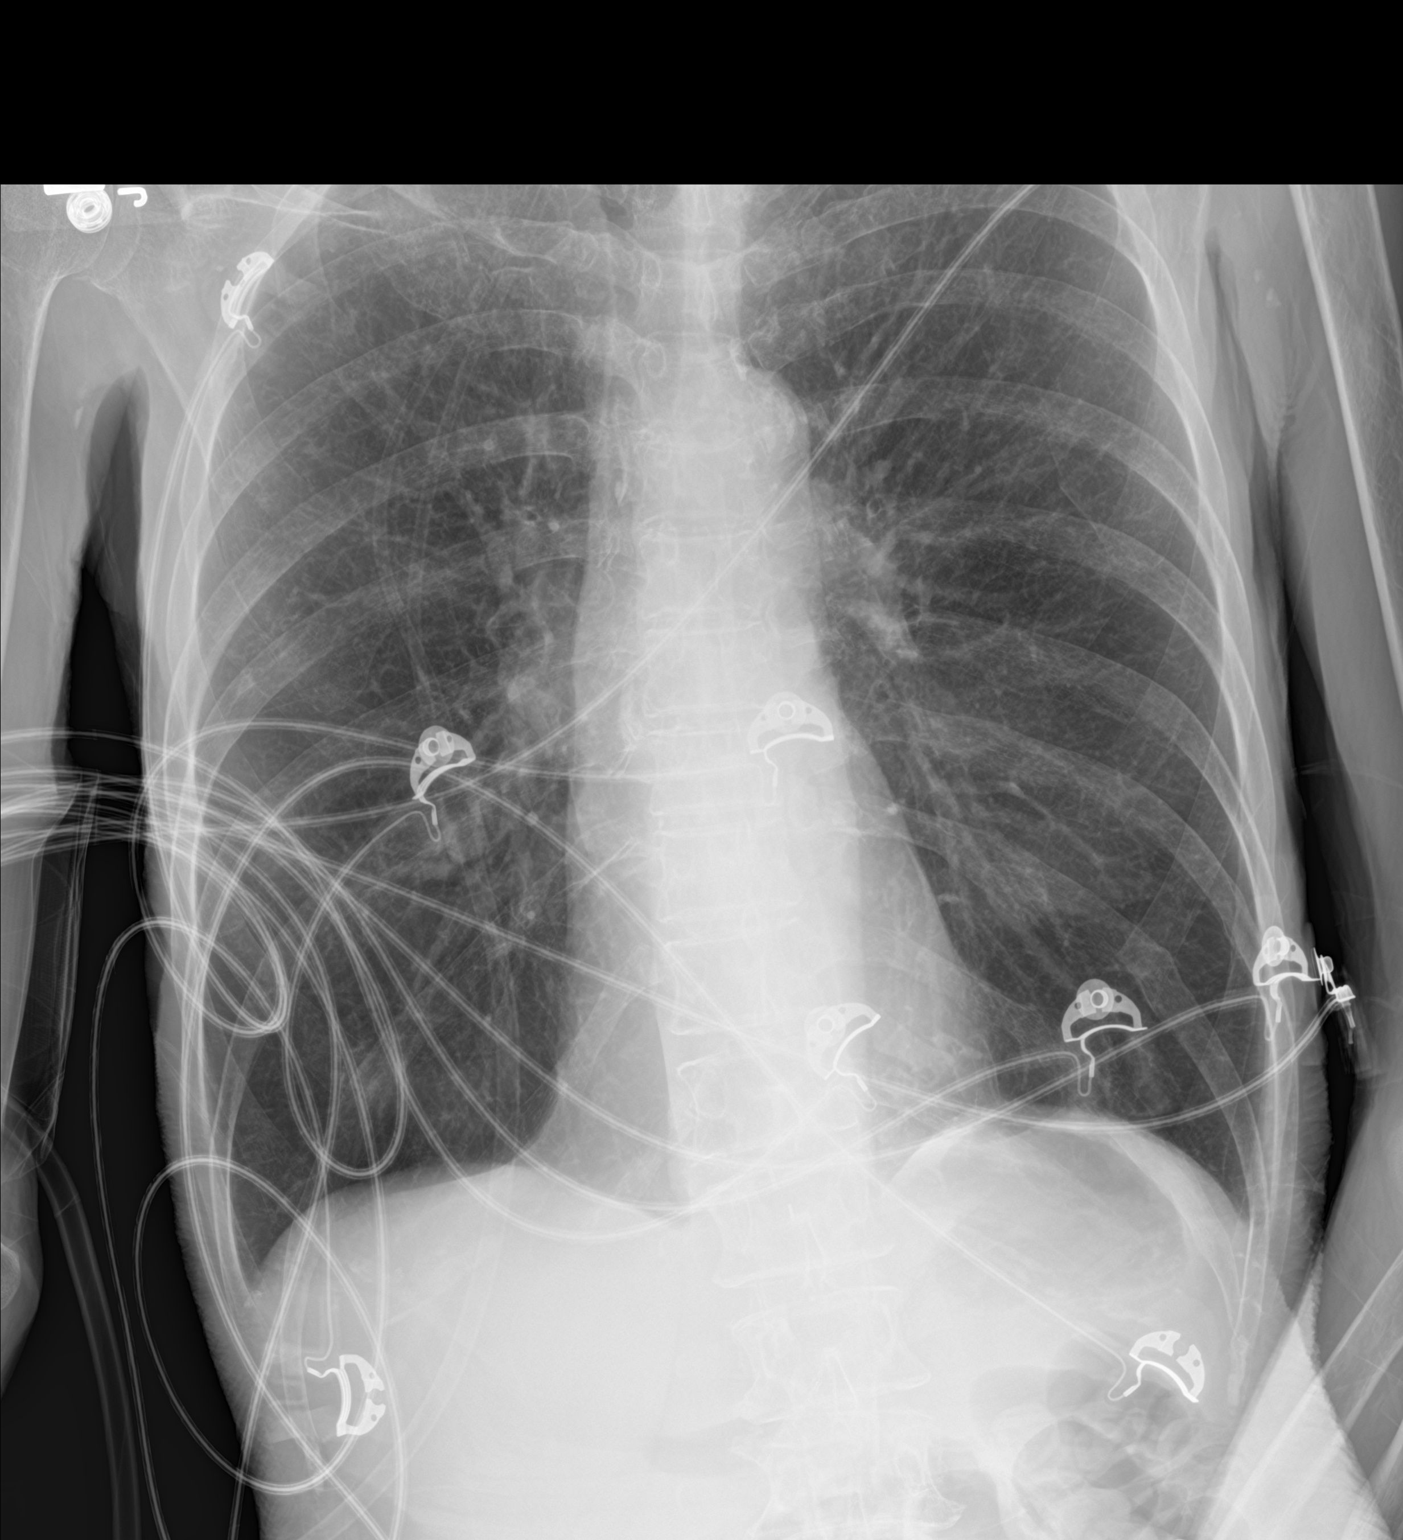

[2 of 2 positions shown; findings below may reference images not displayed]

FINDINGS: Mild hyperinflation and emphysema, similar to prior exam. Slight
decrease in central bronchial thickening. Normal heart size and
mediastinal contours. There is aortic atherosclerosis. No
consolidation, pleural effusion, pneumothorax or pulmonary edema.
Bones are under mineralized.
IMPRESSION: Hyperinflation and emphysema, imaging findings consistent with COPD.
No acute abnormality.

## 2019-10-22 IMAGING — DX DG HIP (WITH OR WITHOUT PELVIS) 2-3V*R*
3 series · 3 of 3 positions shown · non-contrast
Comparison: RIGHT hip radiographs November 14, 2016

CLINICAL DATA: RIGHT hip pain, no injury.

EXAM:
DG HIP (WITH OR WITHOUT PELVIS) 2-3V RIGHT

[pelvis ap]
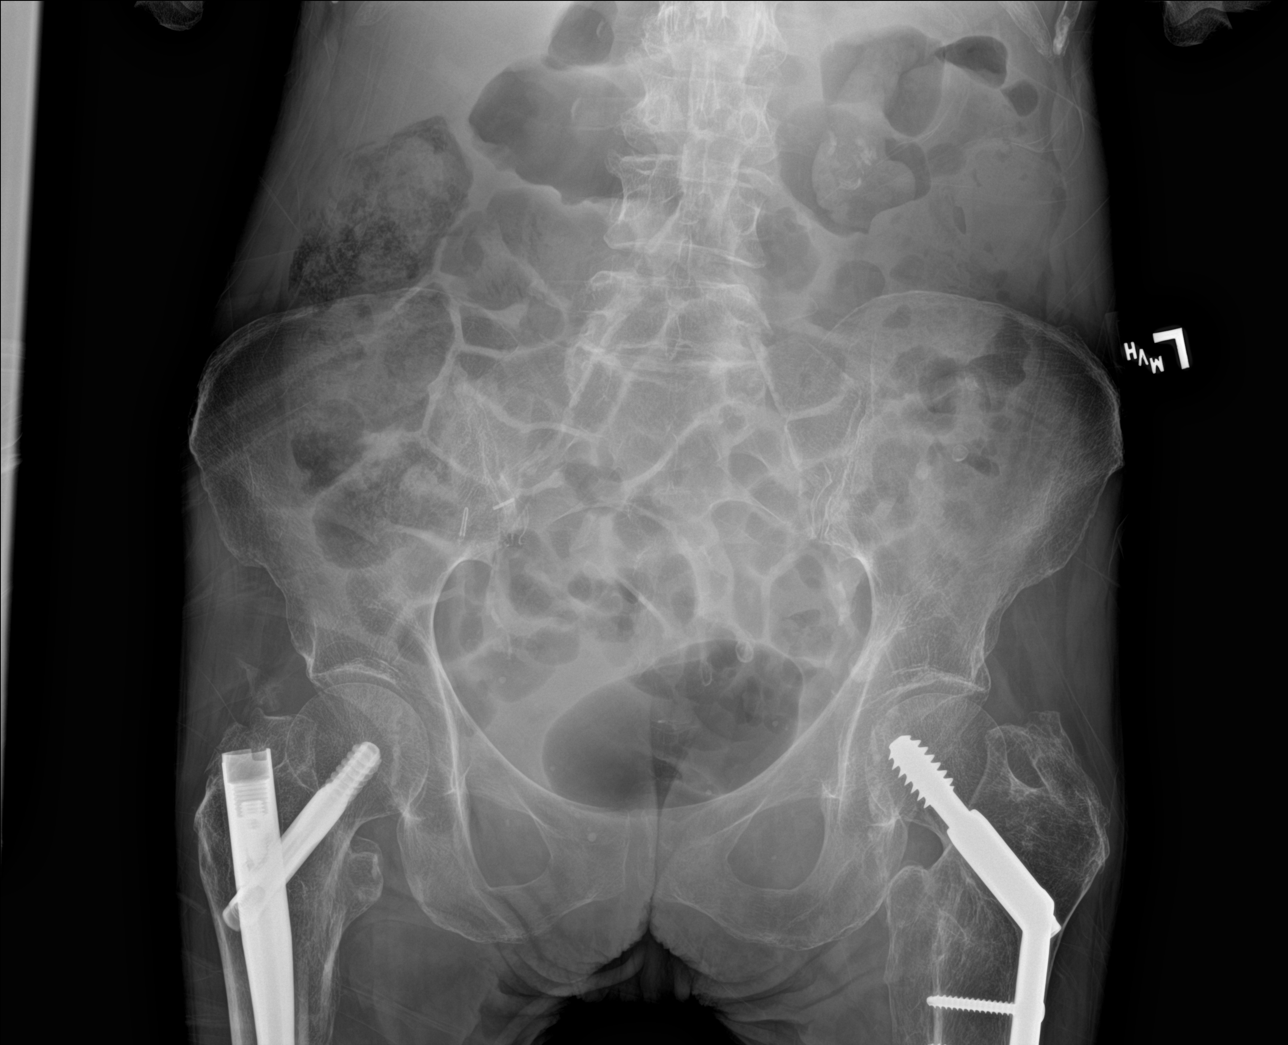

[hip ap]
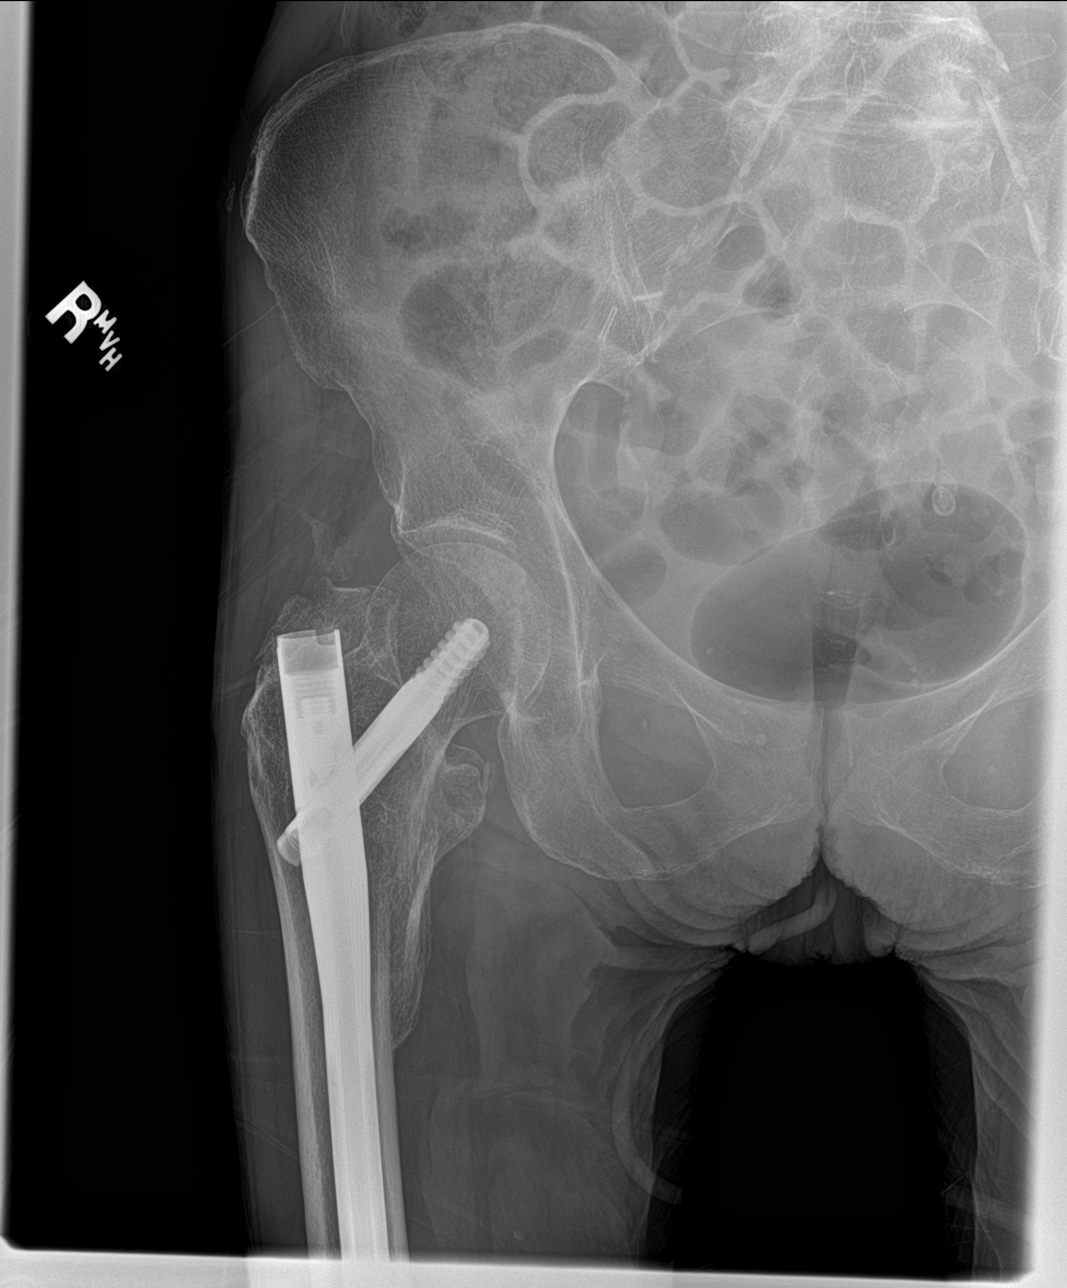

[hip lat]
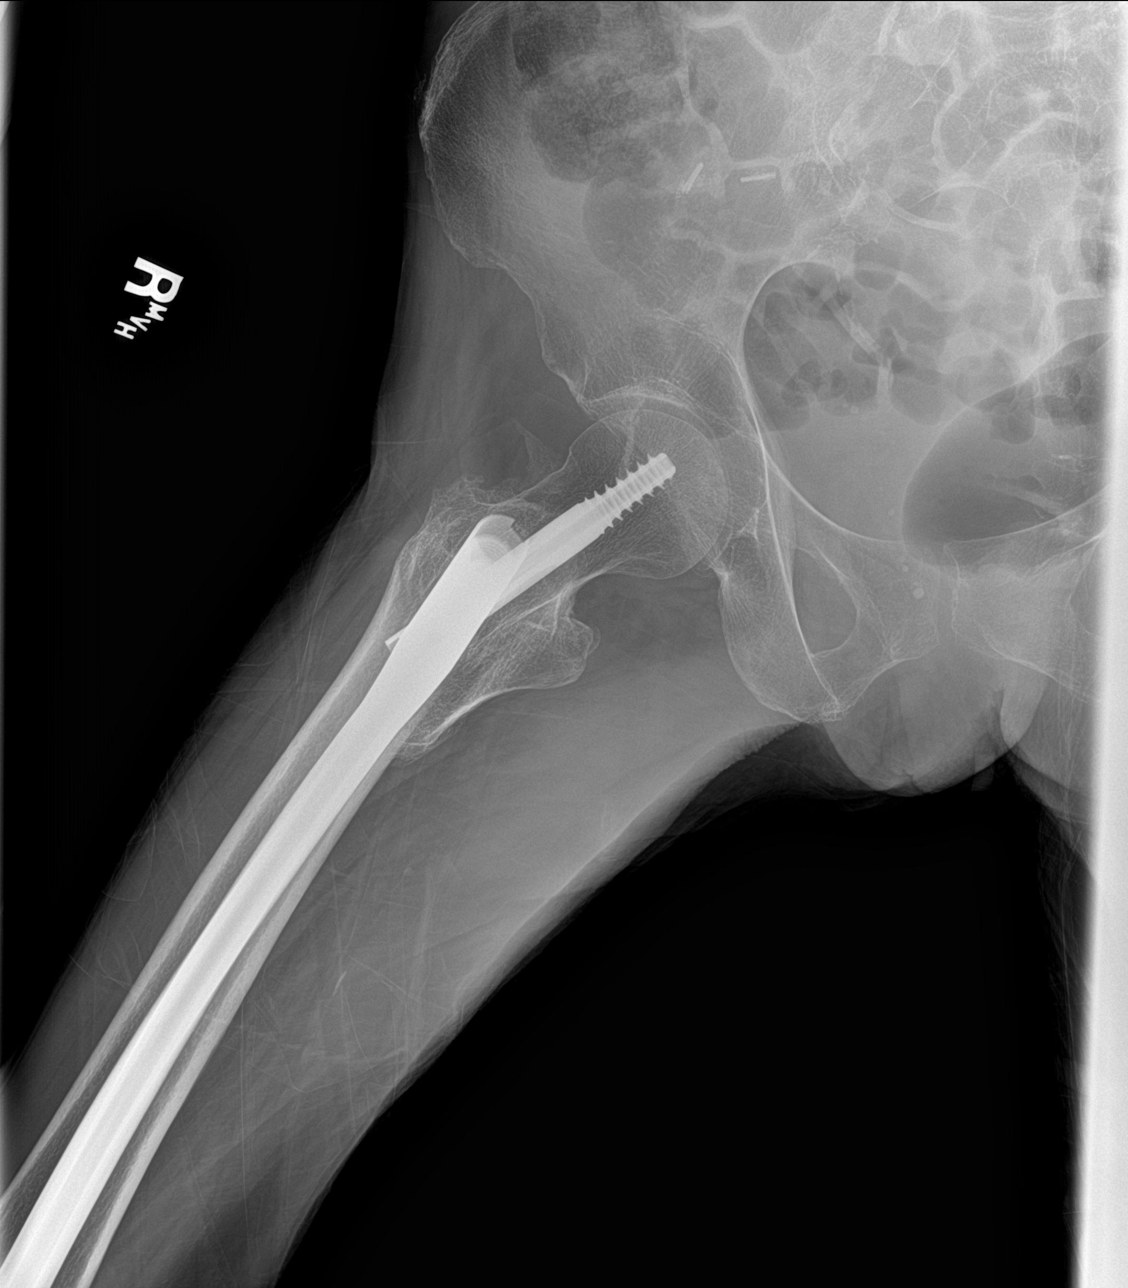

[3 of 3 positions shown; findings below may reference images not displayed]

FINDINGS: There is no evidence of hip fracture or dislocation. There is no
evidence of arthropathy or other focal bone abnormality. Osteopenia.
Status post RIGHT femoral neck pinning with intramedullary rod.
Stable heterotopic ossification RIGHT greater trochanter and to
lesser extent lesser trochanter.. Imaged hardware is intact without
periprosthetic lucency. LEFT femur dynamic screw with lateral plate
and screw fixation, incompletely imaged. Phleboliths project in the
pelvis. Aortoiliac calcifications.
IMPRESSION: 1. No acute fracture deformity or dislocation. Status post bilateral
femur ORIF.

## 2020-01-02 IMAGING — CR DG ABDOMEN ACUTE W/ 1V CHEST
3 series · 3 of 3 positions shown · non-contrast
Comparison: 06/11/2017

CLINICAL DATA: Generalized abdominal pain

EXAM:
DG ABDOMEN ACUTE W/ 1V CHEST

[x chest ap]
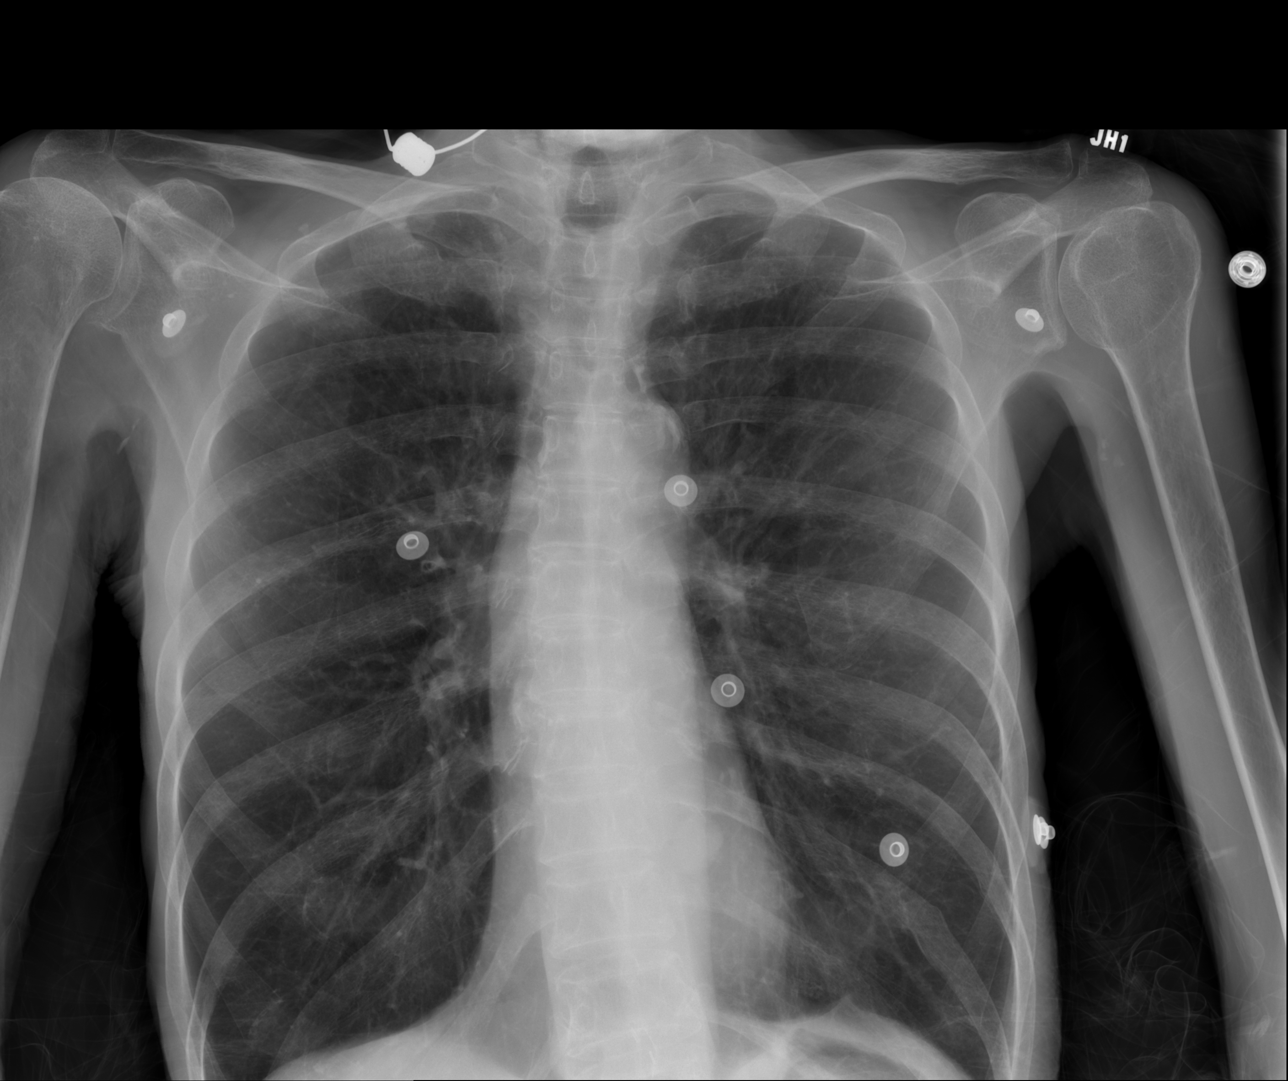

[x abdomen supine]
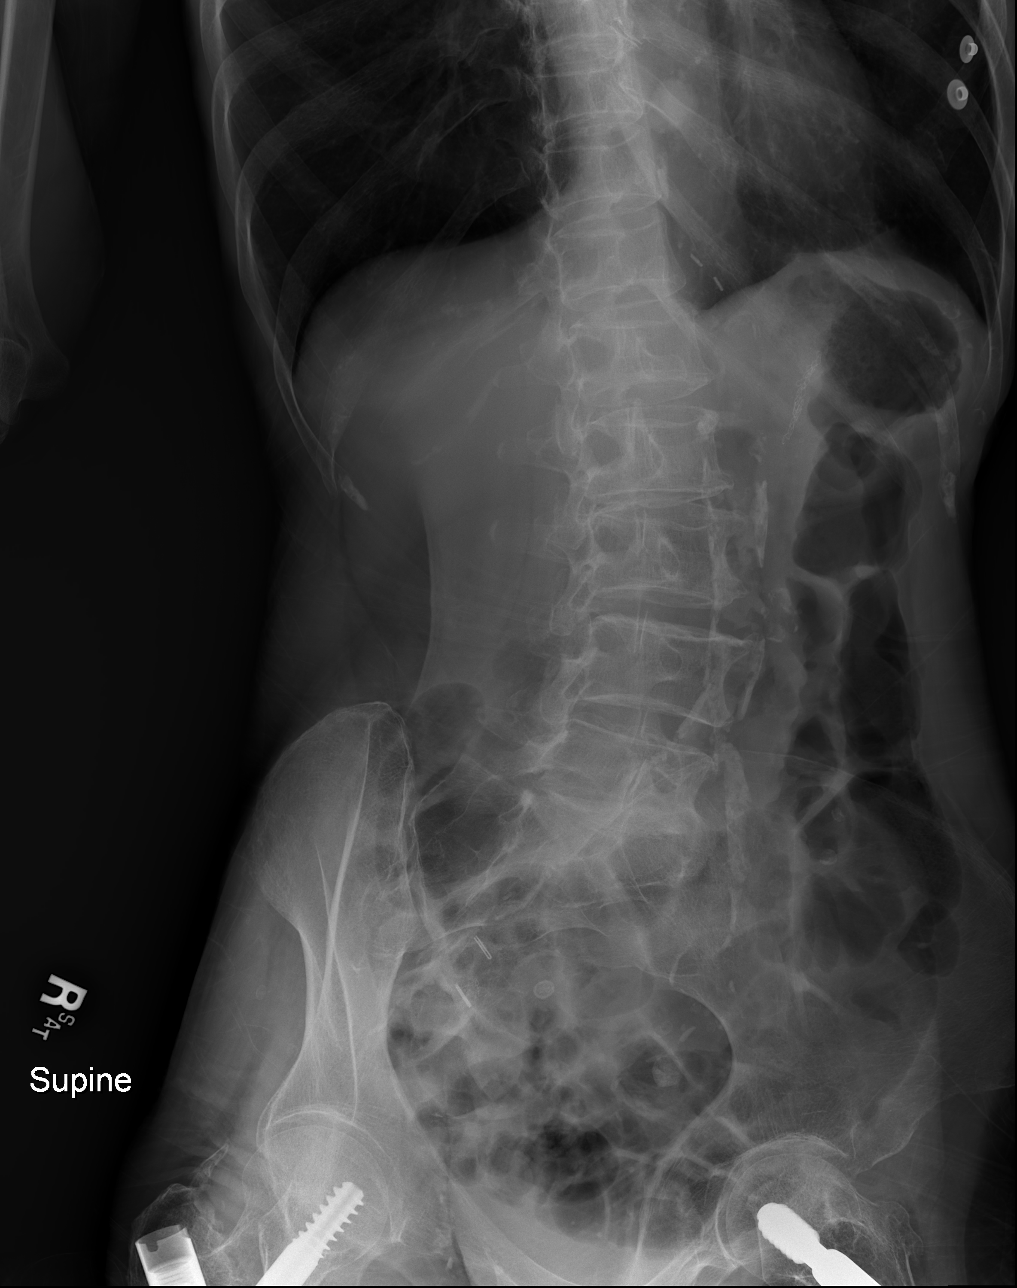

[w abdomen decub]
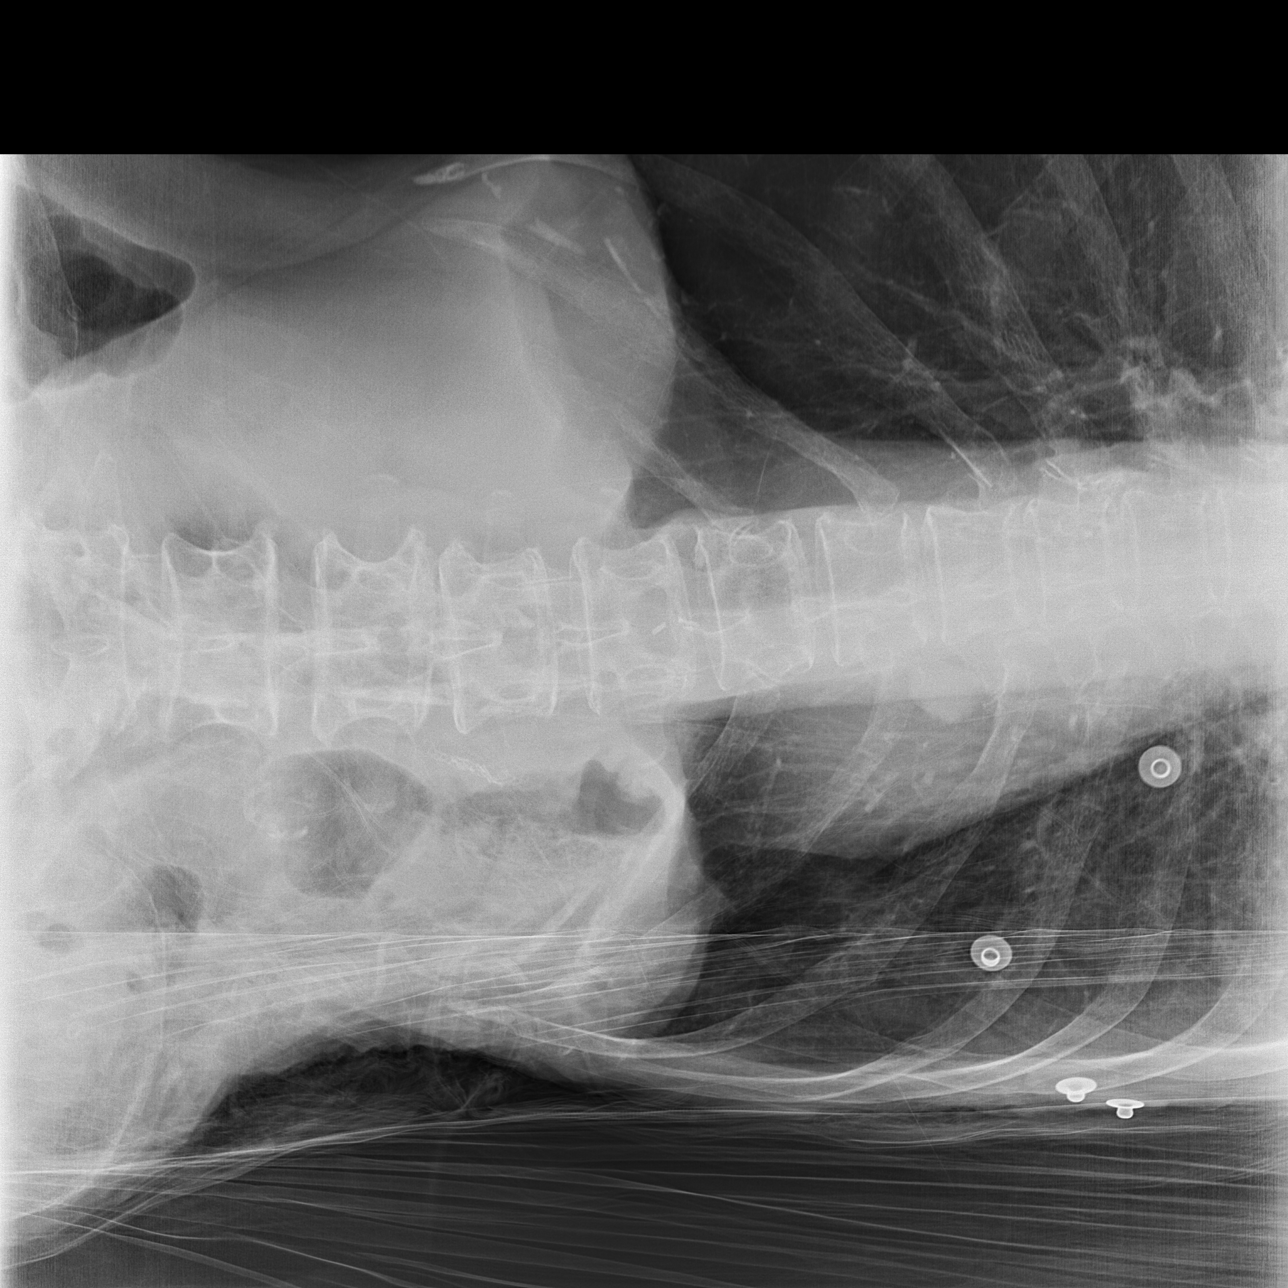

[3 of 3 positions shown; findings below may reference images not displayed]

FINDINGS: There is hyperinflation of the lungs compatible with COPD. Lungs
clear. Heart is normal size. No effusions.

No evidence of bowel obstruction or free air. No organomegaly or
suspicious calcification.
IMPRESSION: No obstruction or free air.

COPD.

No active cardiopulmonary disease.

## 2020-01-24 IMAGING — CR DG CHEST 2V
2 series · 2 of 2 positions shown · non-contrast
Comparison: Chest radiograph June 19, 2017 and CT chest January 15, 2017

CLINICAL DATA: Cough, lower chest pain. History of substance abuse
and COPD.

EXAM:
CHEST - 2 VIEW

[w chest pa]
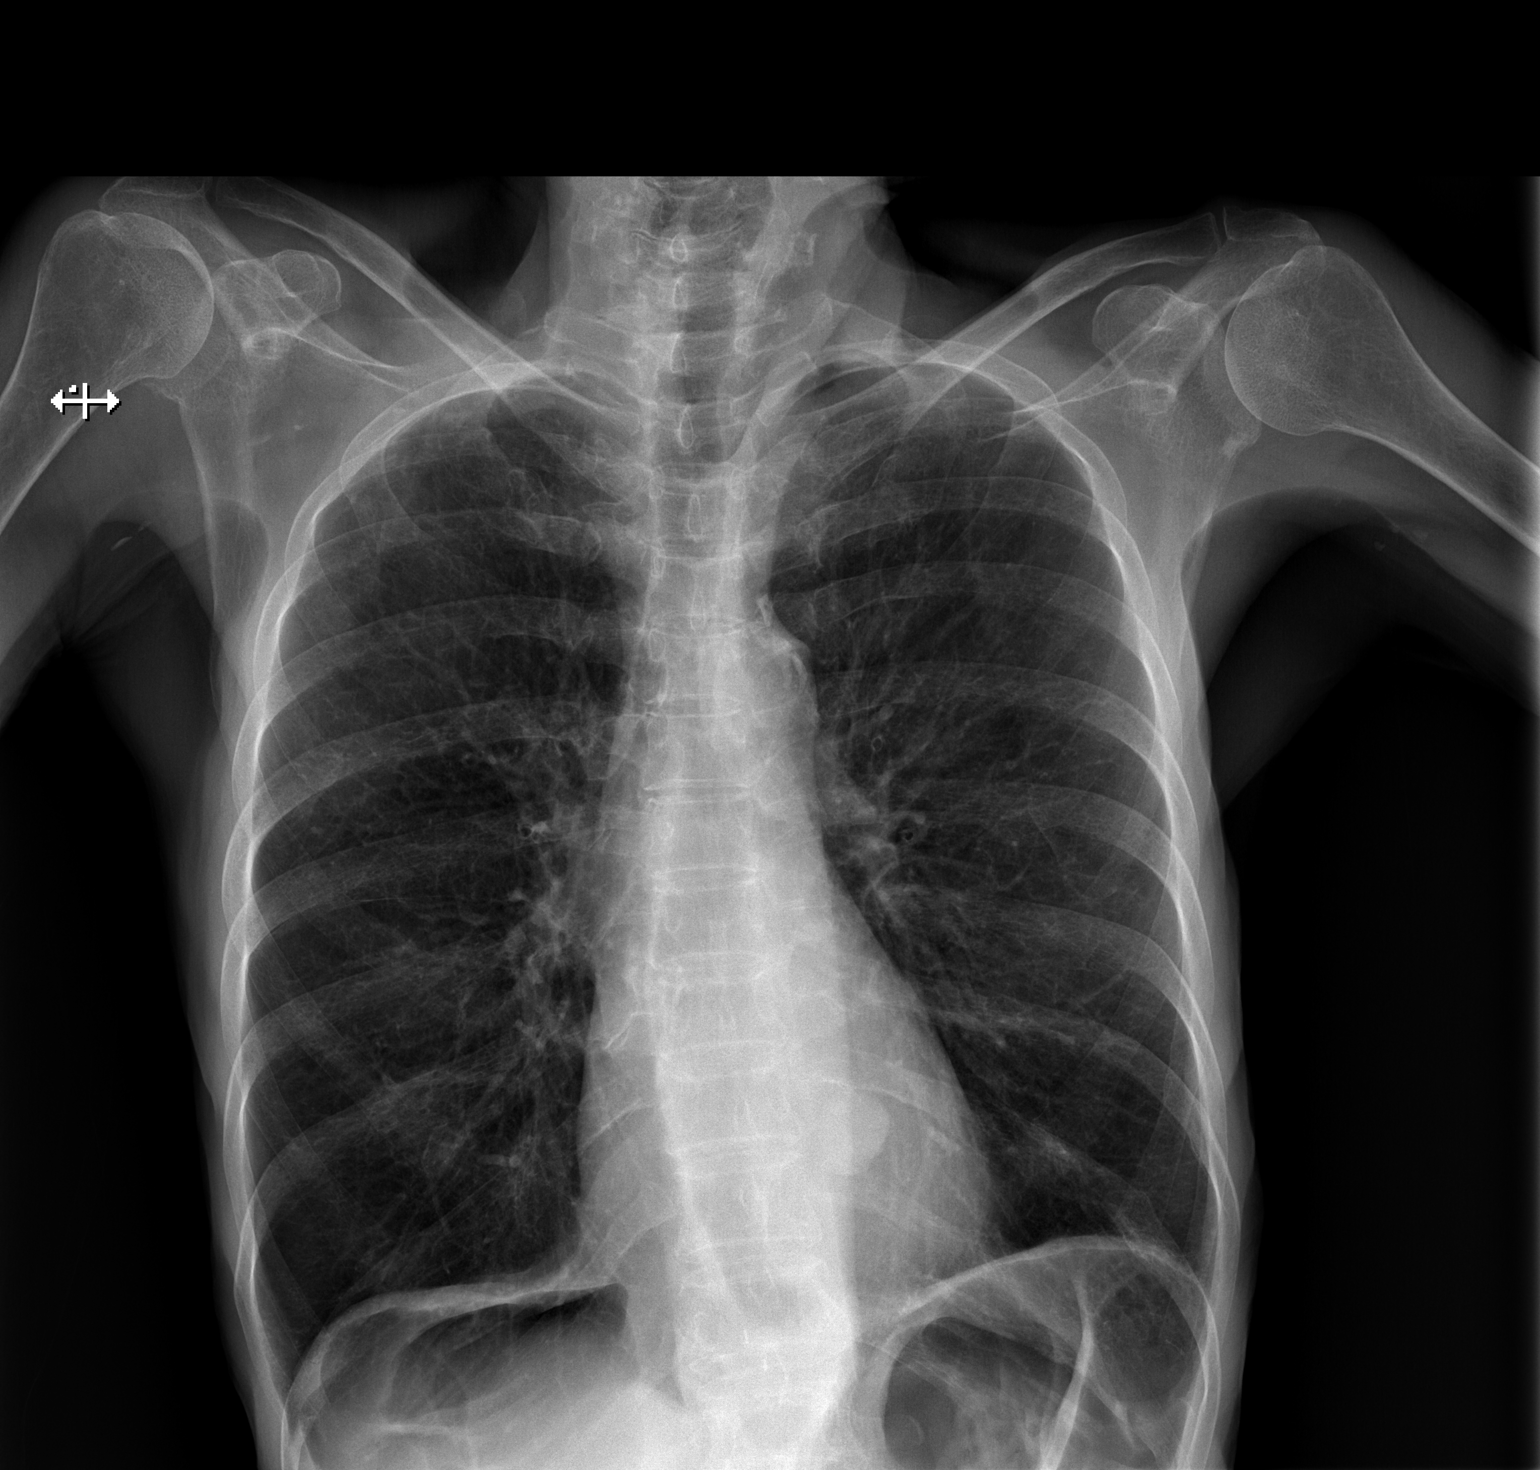

[w chest lat]
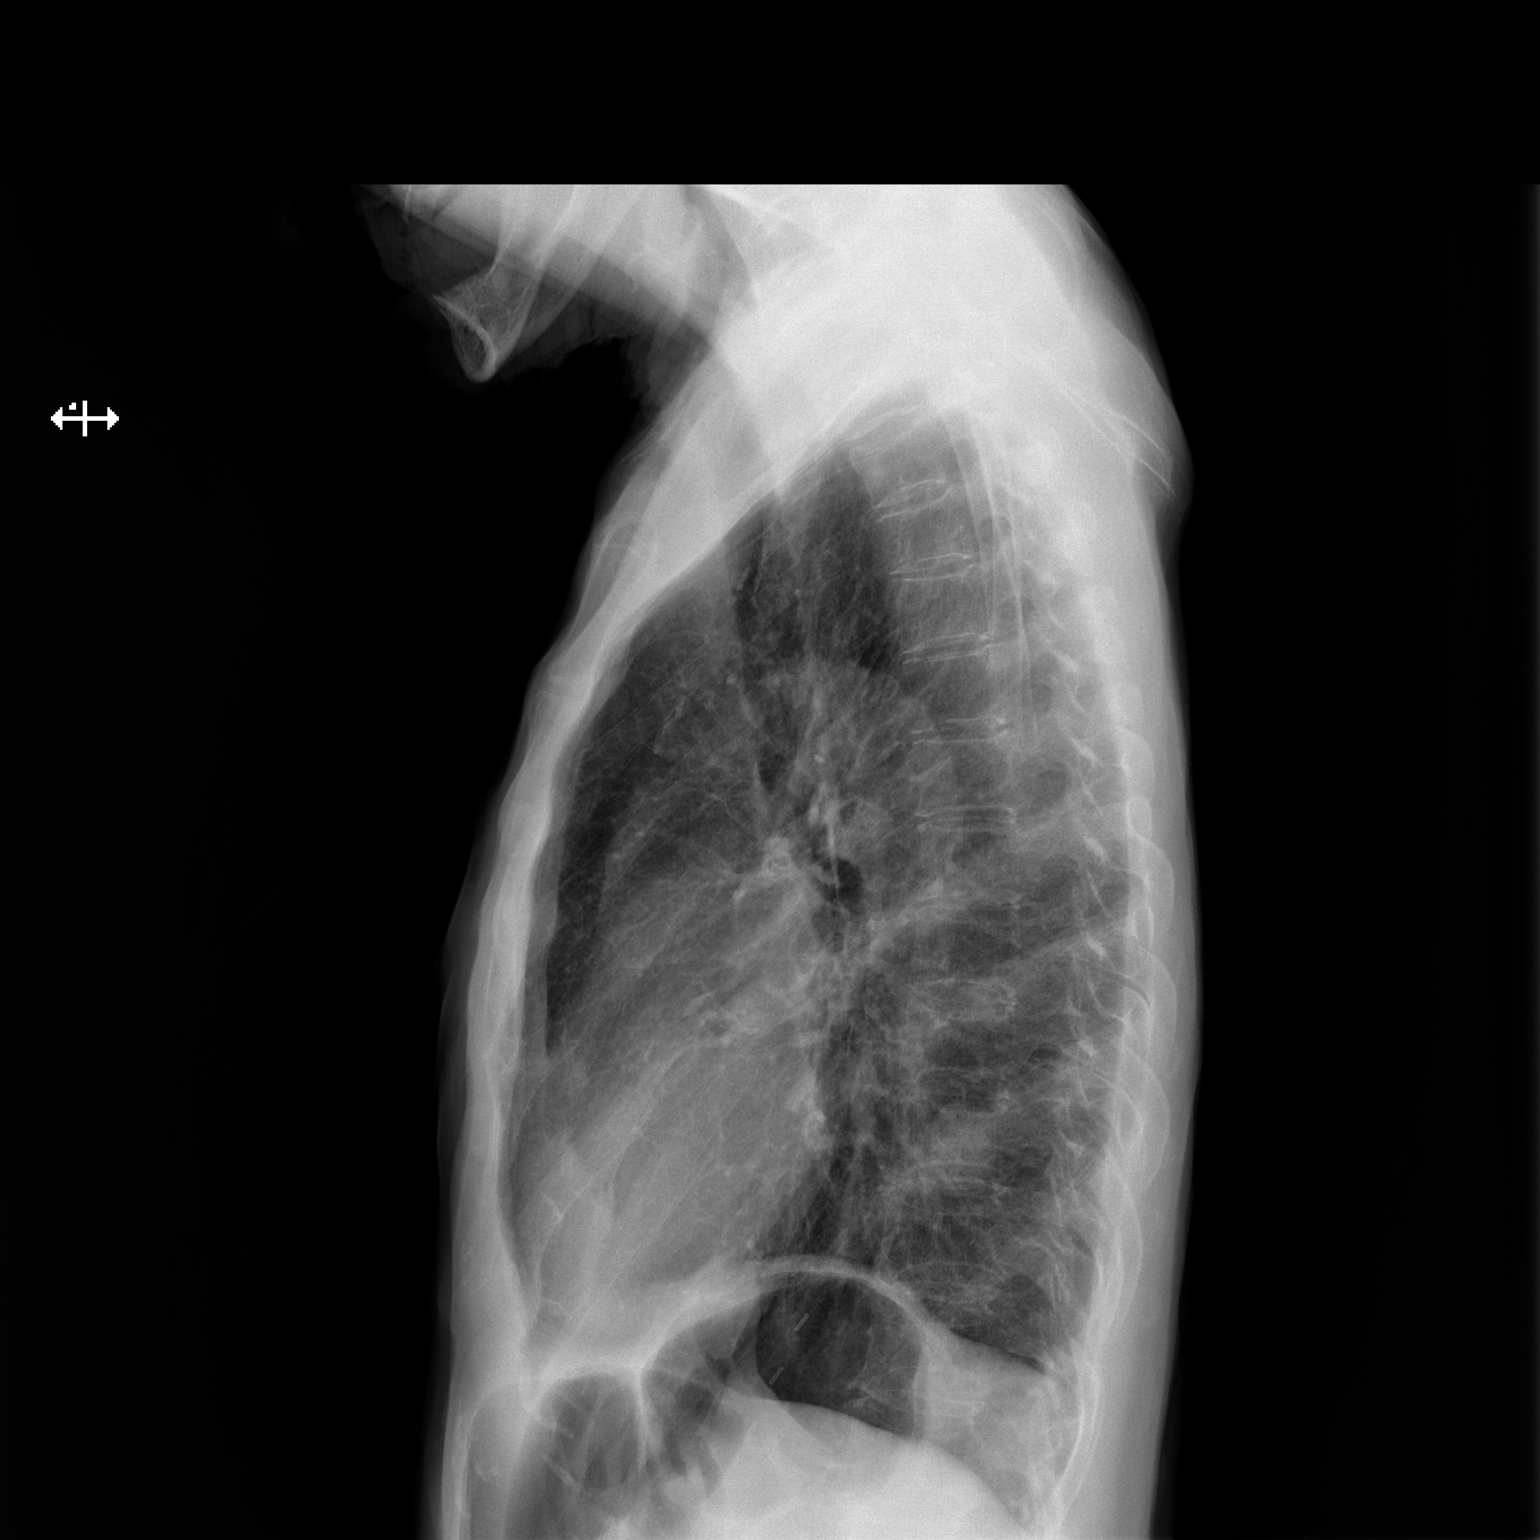

[2 of 2 positions shown; findings below may reference images not displayed]

FINDINGS: Cardiomediastinal silhouette is normal. Ovoid nodule projecting over
LEFT heart, possibly and LEFT lower lobe. No pleural effusions or
focal consolidations. Mild chronic interstitial change of the
increased lung volumes, flattened hemidiaphragms. Trachea projects
midline and there is no pneumothorax. Soft tissue planes and
included osseous structures are non-suspicious. Old LEFT rib
fractures. Cachexia. Gas distended bowel in the included abdomen.
IMPRESSION: COPD.  No acute cardiopulmonary process.

Nodule projecting LEFT lung base without corresponding abnormality
on prior CT. Recommend follow up CT chest with contrast on non
emergent basis.

Aortic Atherosclerosis (IU5RS-VTY.Y).  Emphysema (IU5RS-SYC.S).

## 2020-02-04 IMAGING — MR MR LUMBAR SPINE W/O CM
4 of 5 series · 26 of 48 positions shown · non-contrast
Comparison: CT abdomen 01/15/2017.  MRI 03/17/2007

CLINICAL DATA: Low back pain radiating down both legs with
bilateral leg numbness in weakness for the last 3 years.

EXAM:
MRI LUMBAR SPINE WITHOUT CONTRAST
TECHNIQUE: Multiplanar, multisequence MR imaging of the lumbar spine was
performed. No intravenous contrast was administered.

[Series 9: T2 · sagittal · 4.0mm · 0.88mm/px · 6 of 17 slices shown (1 of 2)]
[im 1/17]
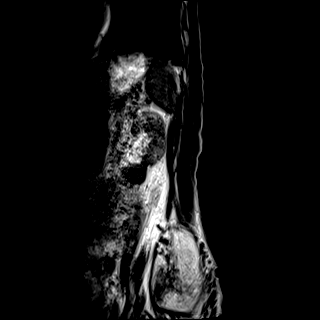
[im 4/17]
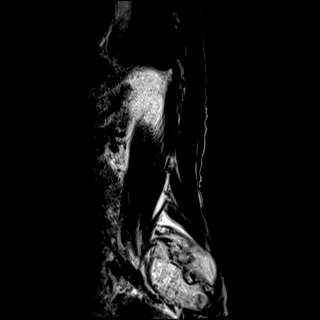
[im 7/17]
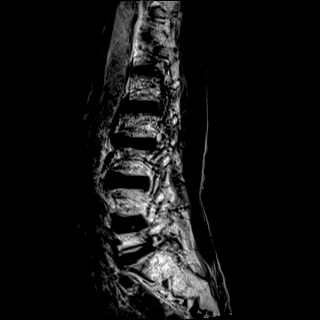
[im 10/17]
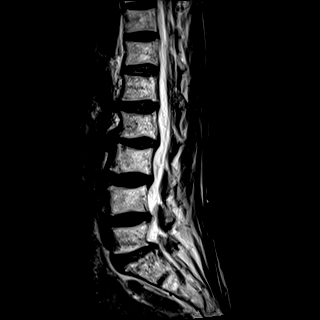
[im 13/17]
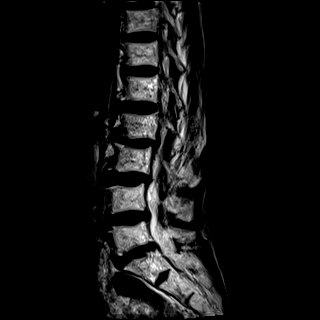
[im 17/17]
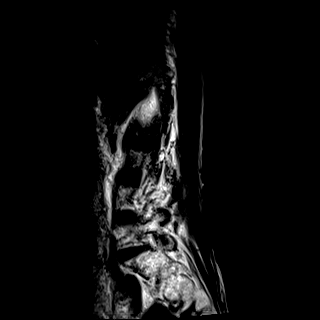

[Series 11: T1 · sagittal · 4.0mm · 0.88mm/px · 6 of 17 slices shown (1 of 2)]
[im 1/17]
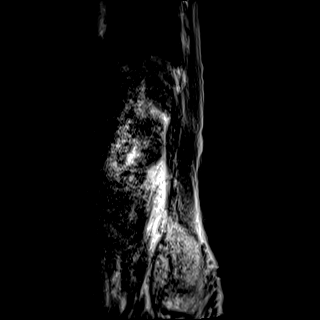
[im 4/17]
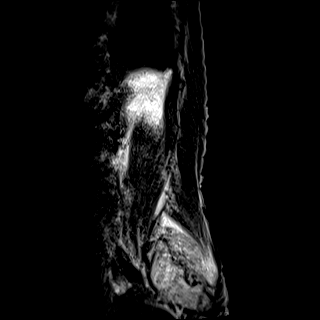
[im 7/17]
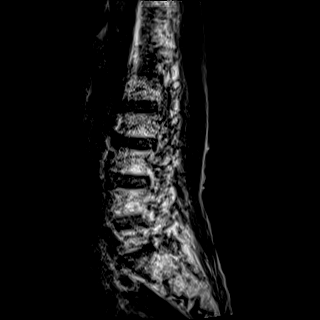
[im 10/17]
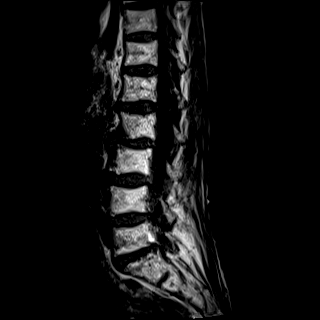
[im 13/17]
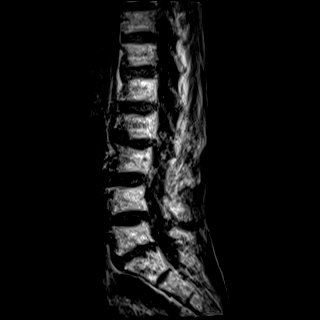
[im 17/17]
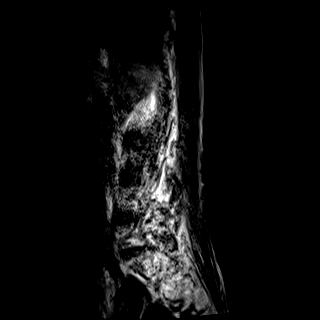

[Series 14: T1 · axial · 4.0mm · 0.35mm/px · z∈[-62,+114]mm · 5 of 40 slices shown (2 of 2)]
[im 1/40]
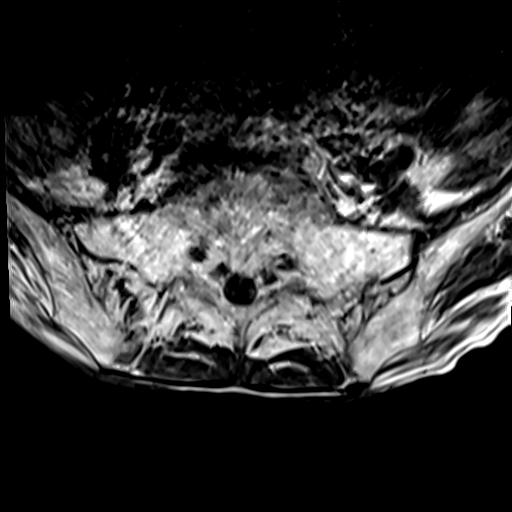
[im 6/40]
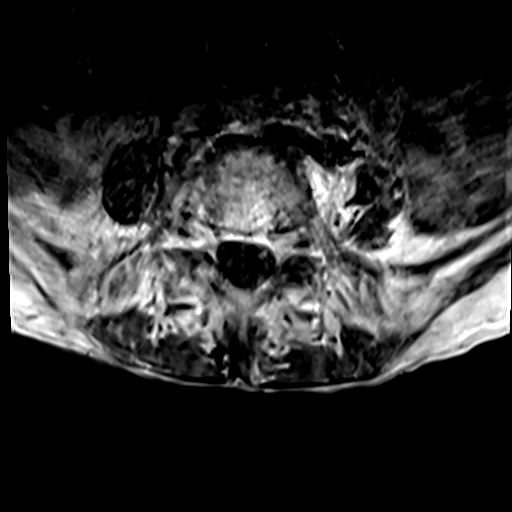
[im 12/40]
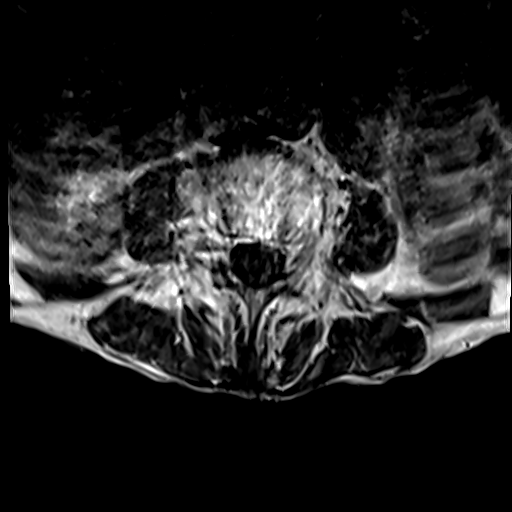
[im 20/40]
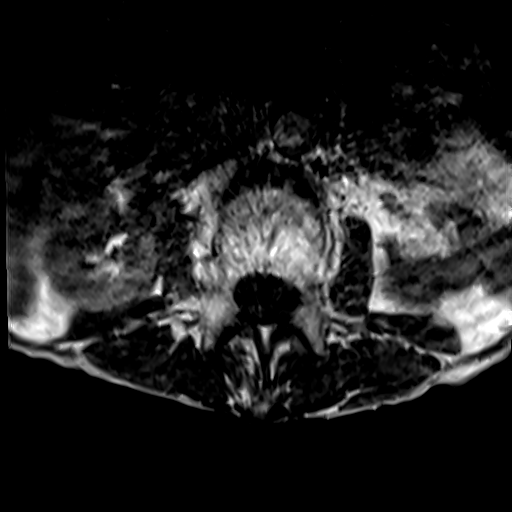
[im 34/40]
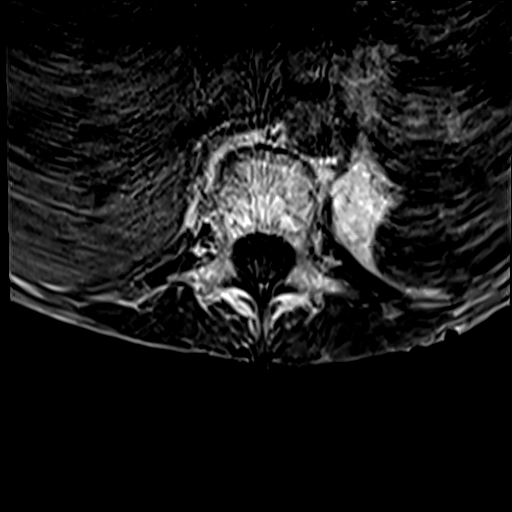

[Series 17: T2 · axial · 4.0mm · 0.35mm/px · z∈[-62,+144]mm · 9 of 40 slices shown (2 of 2)]
[im 1/40]
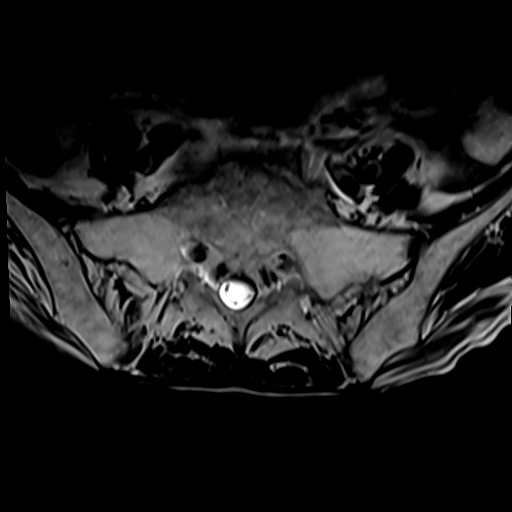
[im 6/40]
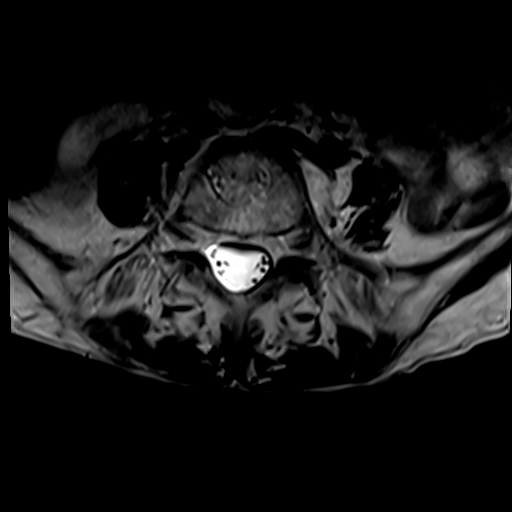
[im 12/40]
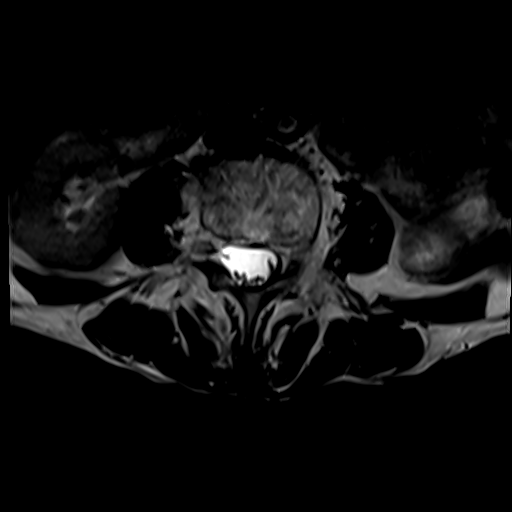
[im 17/40]
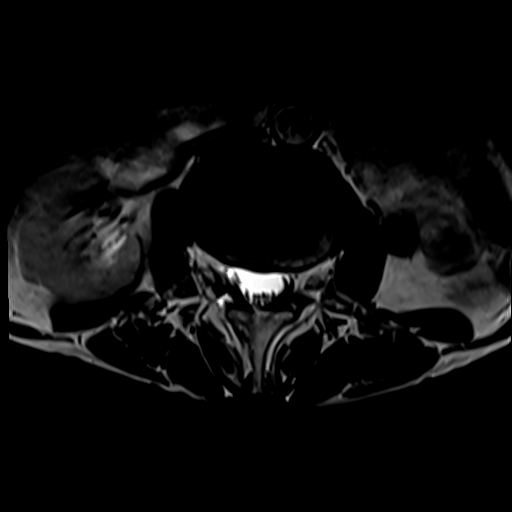
[im 20/40]
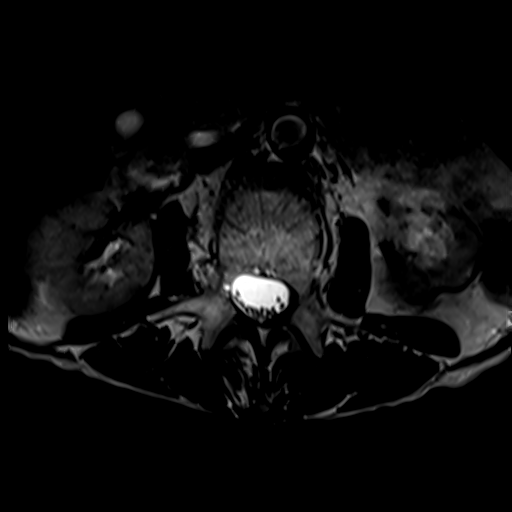
[im 23/40]
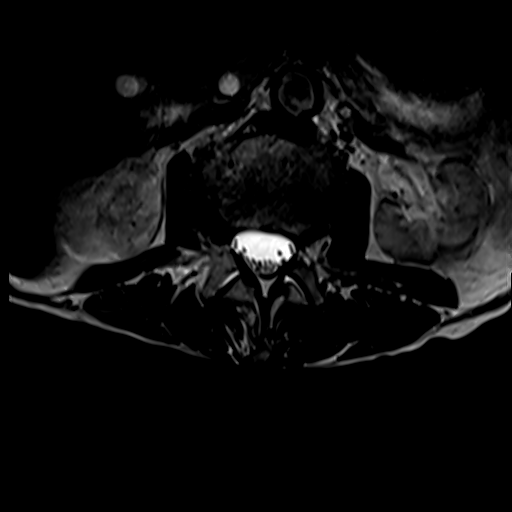
[im 28/40]
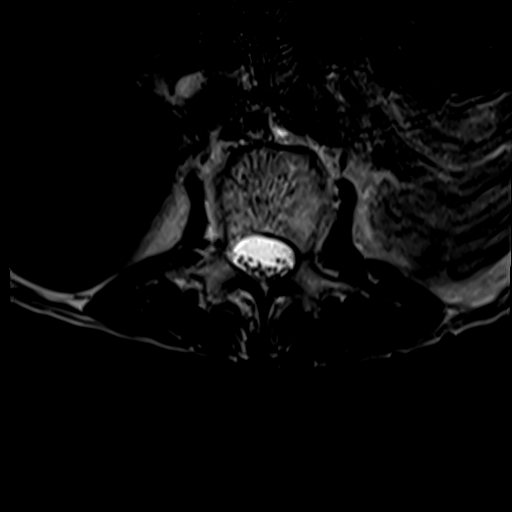
[im 34/40]
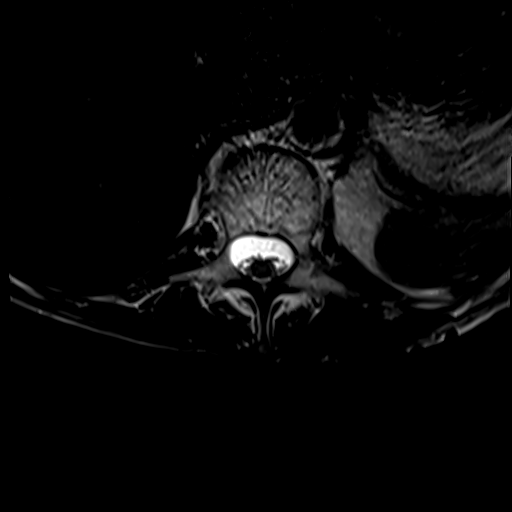
[im 40/40]
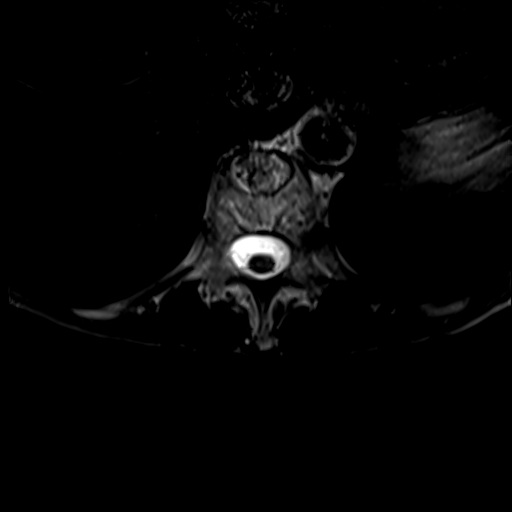

[26 of 48 positions shown; findings below may reference images not displayed]

FINDINGS: Segmentation:  5 lumbar type vertebral bodies.

Alignment:  1 or 2 mm of anterolisthesis at L5-S1.

Vertebrae:  No fracture or primary bone lesion.

Conus medullaris and cauda equina: Conus extends to the L2 level.
Conus and cauda equina appear normal.

Paraspinal and other soft tissues: Negative

Disc levels:

No abnormality at T11-12, T12-L1 or L1-2.

L2-3: Disc degeneration with bulging of the disc, more prominent in
the right foraminal to extraforaminal region. No canal stenosis. No
likely compression of the exiting L2 nerve.

L3-4: Mild bulging of the disc.  No compressive stenosis.

L4-5: Mild bulging of the disc.  No compressive stenosis.

L5-S1: Facet osteoarthritis with 1 or 2 mm of anterolisthesis. Mild
bulging of the disc. No compressive canal stenosis. Mild foraminal
narrowing on the left.

Since the previous study, the right-sided disc bulge at L2-3 has
worsened slightly.
IMPRESSION: L2-3: Disc degeneration with bulging of the disc more towards the
right with mild foraminal narrowing but no distinct neural
compression. Slight worsening of this pathology since the comparison
study of 2882.

L5-S1 facet degeneration with 1 or 2 mm of anterolisthesis. Mild
bulging of the disc. Mild left foraminal narrowing but without
definite compression of the exiting L5 nerve. Similar appearance to
the previous study.

## 2020-03-06 IMAGING — CT CT ABD-PELV W/ CM
2 of 5 series · 14 of 46 positions shown, 16 images · IV contrast (ISOVUE)
Comparison: Chest, abdomen, and pelvis CT 01/15/2017, 02/05/2017,
prior chest radiographs

CLINICAL DATA: Acute abdominal pain. Patient reports back and leg
pain. Decreased appetite.

EXAM:
CT ABDOMEN AND PELVIS WITH CONTRAST
TECHNIQUE: Multidetector CT imaging of the abdomen and pelvis was performed
using the standard protocol following bolus administration of
intravenous contrast.
CONTRAST:  75mL OMNIPAQUE IOHEXOL 300 MG/ML  SOLN

[Series 2: axial st · axial · 0.60mm/px · z∈[-446,-110]mm · 11 of 79 slices shown, 13 images]
[im 6/79  soft-tissue]
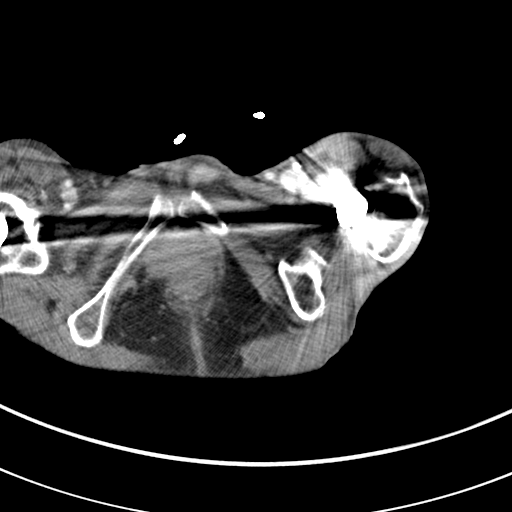
[im 6/79  bone]
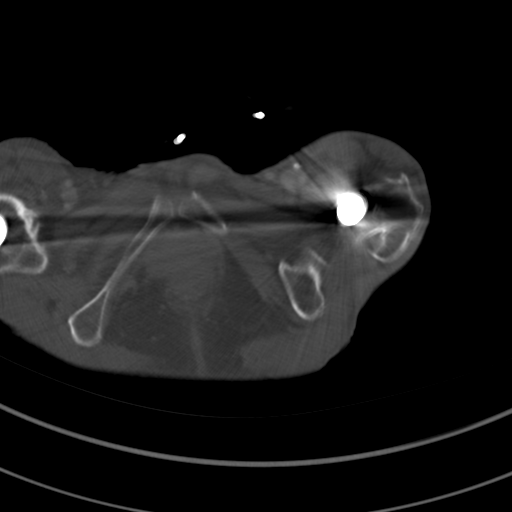
[im 11/79  soft-tissue]
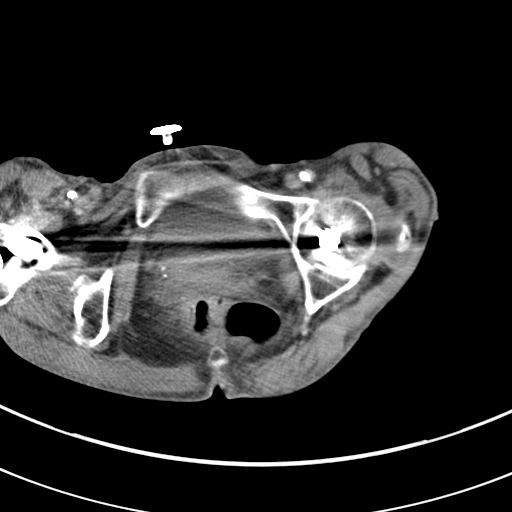
[im 21/79  soft-tissue]
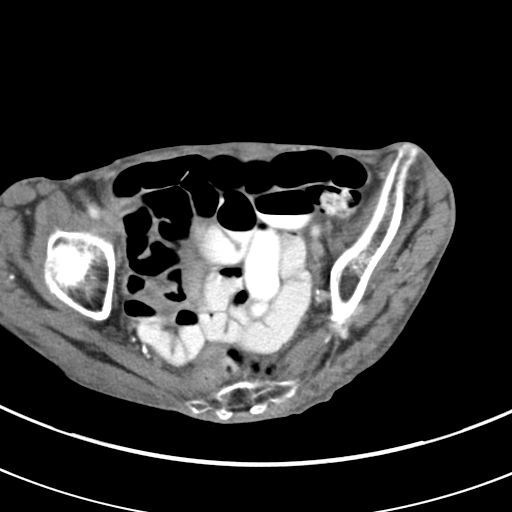
[im 27/79  soft-tissue]
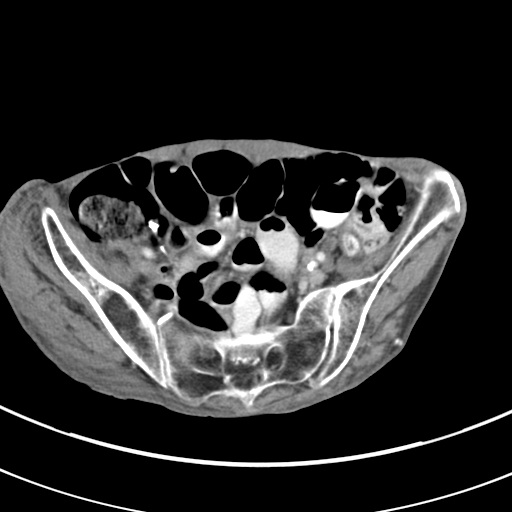
[im 32/79  soft-tissue]
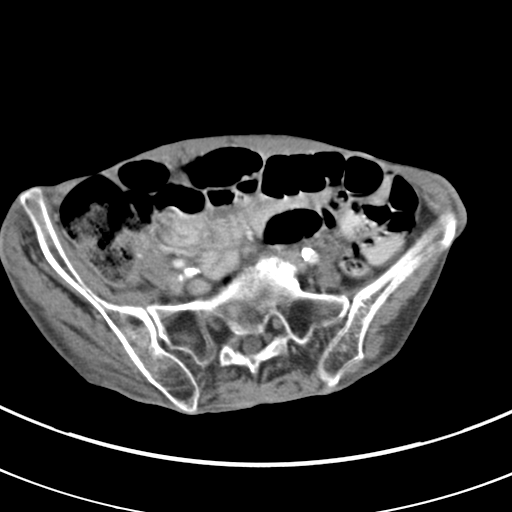
[im 42/79  soft-tissue]
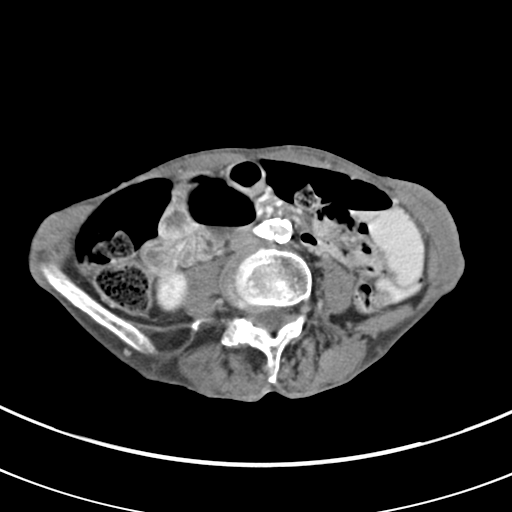
[im 47/79  soft-tissue]
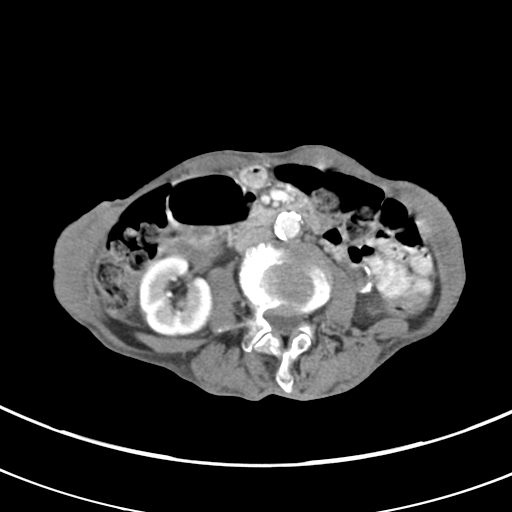
[im 53/79  soft-tissue]
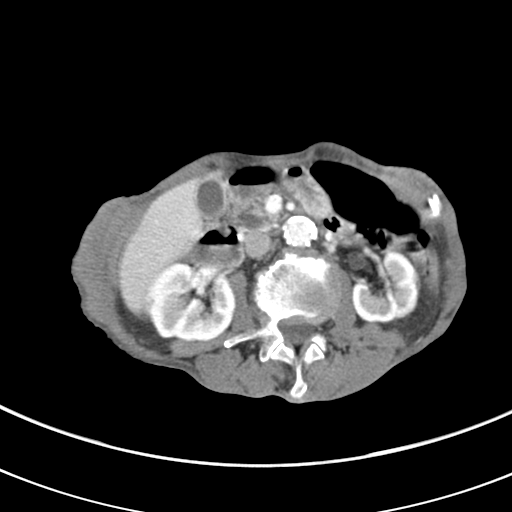
[im 58/79  soft-tissue]
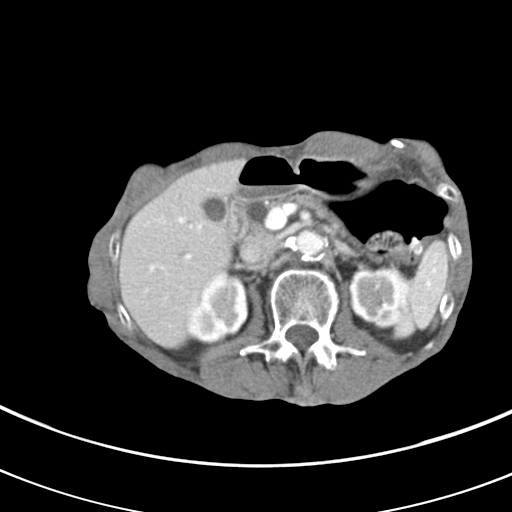
[im 58/79  bone]
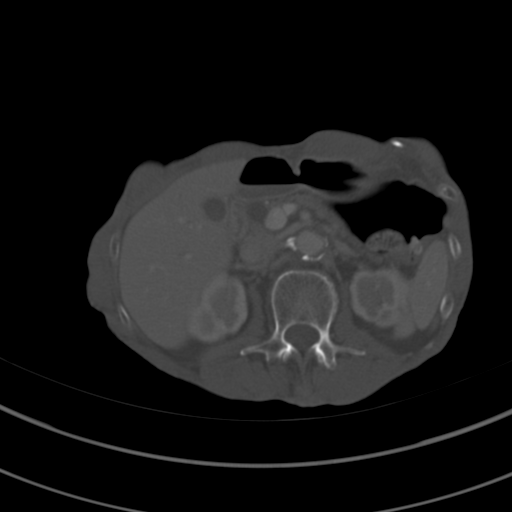
[im 68/79  soft-tissue]
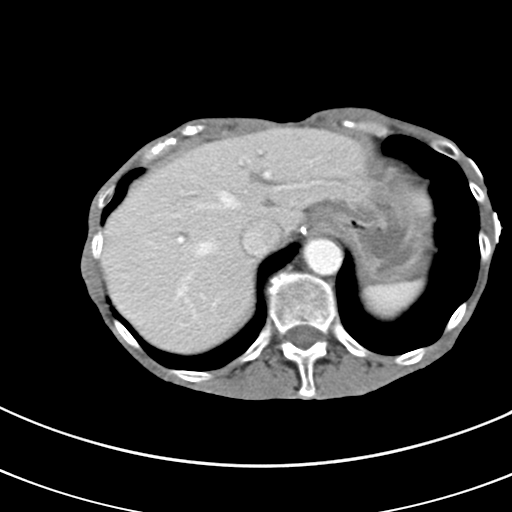
[im 73/79  soft-tissue]
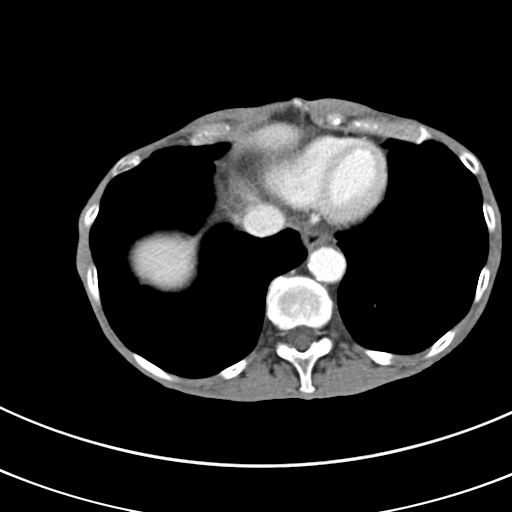

[Series 4: coronal st · coronal · 0.49mm/px · 3 of 57 slices shown]
[im 19/57  soft-tissue]
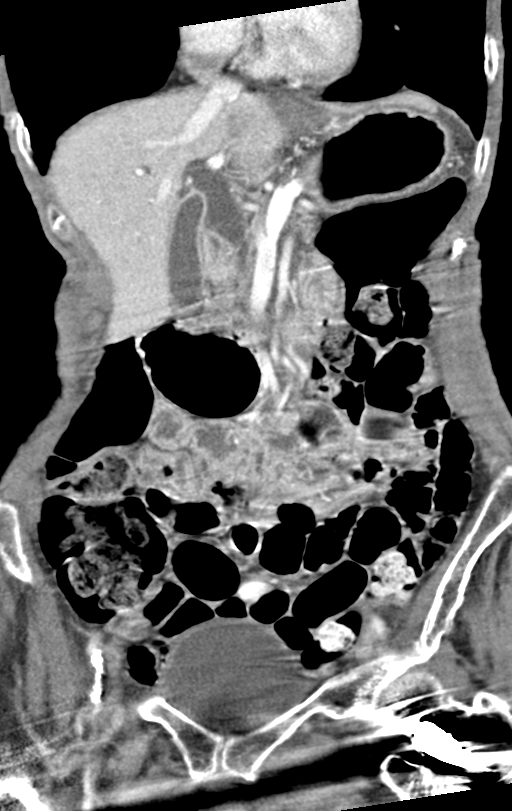
[im 25/57  soft-tissue]
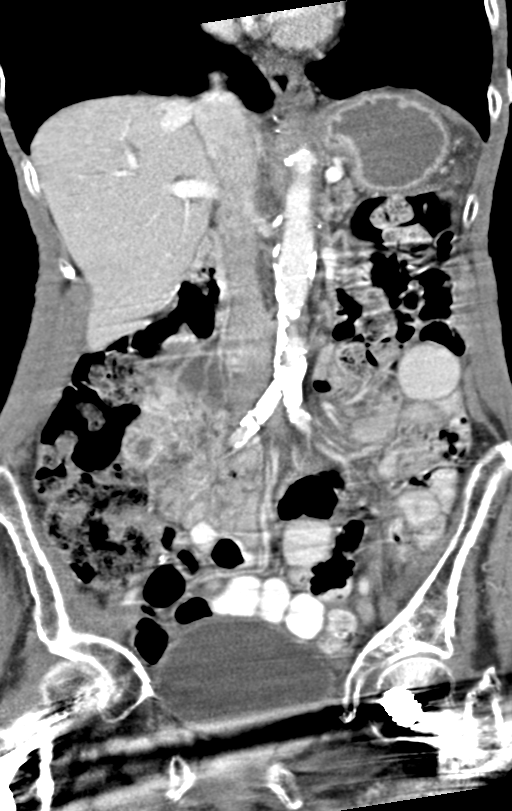
[im 32/57  soft-tissue]
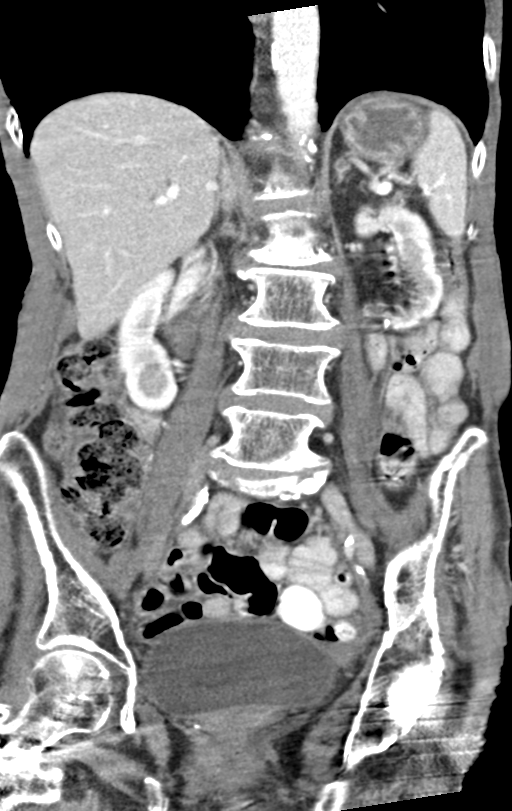

[14 of 46 positions shown; findings below may reference images not displayed]

FINDINGS: Lower chest: Pulmonary nodule with central low density in the left
lower lobe measures 2.5 cm, only partially included in the field of
view. This abuts the pleura posteriorly. This likely represents
increase size of a 3 mm nodule on prior exam.

Hepatobiliary: Unchanged intra and extrahepatic biliary ductal
dilatation, common bile duct measuring approximately 15 mm. A
calcified gallstone or visualized choledocholithiasis. Gallbladder
is partially distended. No focal hepatic lesion.

Pancreas: Atrophic with parenchyma not well visualized. No
peripancreatic inflammation.

Spleen: Normal in size without focal abnormality.

Adrenals/Urinary Tract: No adrenal nodule. Stable appearance of post
ablation defect in the lower left kidney with dystrophic
calcification. No hydronephrosis. No perinephric edema. No new renal
lesion allowing for mild motion artifact. Urinary bladder is
physiologically distended, no bladder wall thickening.

Stomach/Bowel: Paucity of intra-abdominal fat limits detailed bowel
evaluation. Small hiatal hernia. Fluid in the stomach with equivocal
gastric wall thickening. Surgical clips along the lesser curvature
of stomach. No bowel obstruction, evidence of wall thickening or
inflammatory change. Appendix not visualized.

Vascular/Lymphatic: Advanced aortic and branch atherosclerosis.
Atherosclerotic calcification of the origin of both renal arteries.
No acute vascular findings. No bulky adenopathy, evaluation for
adenopathy is limited due to paucity of intra-abdominal fat.

Reproductive: Uterus not definitively visualized.  No adnexal mass.

Other: No free air or free fluid. Subcutaneous calcifications in the
left greater than right flank, unchanged and incidental.

Musculoskeletal: Bones are under mineralized. Degenerative disc
disease throughout the lumbar spine. No blastic osseous lesion.
Evaluation for lytic lesions limited given underlying bony under
mineralization. Bilateral hip pinning.
IMPRESSION: 1. Fluid in the stomach with possible gastric enhancement, may
represent gastritis.
2. Partially included 2.5 cm left lower lobe pulmonary nodule,
retrospectively increasing since CT 6 months prior were is faintly
visualized. This may represent metastasis given history of renal
cancer versus primary bronchogenic malignancy. Recommend complete
chest evaluation with dedicated chest CT, this could be performed on
a nonemergent basis based on patient's symptoms.
3. Chronic biliary dilatation, unchanged from prior exams.
4.  Aortic Atherosclerosis (E5FPP-LW4.4).

## 2020-03-12 IMAGING — CR DG ABDOMEN ACUTE W/ 1V CHEST
3 series · 3 of 3 positions shown · non-contrast
Comparison: CT of the abdomen and pelvis 08/22/2017

CLINICAL DATA: Epigastric pain.

EXAM:
DG ABDOMEN ACUTE W/ 1V CHEST

[x chest ap]
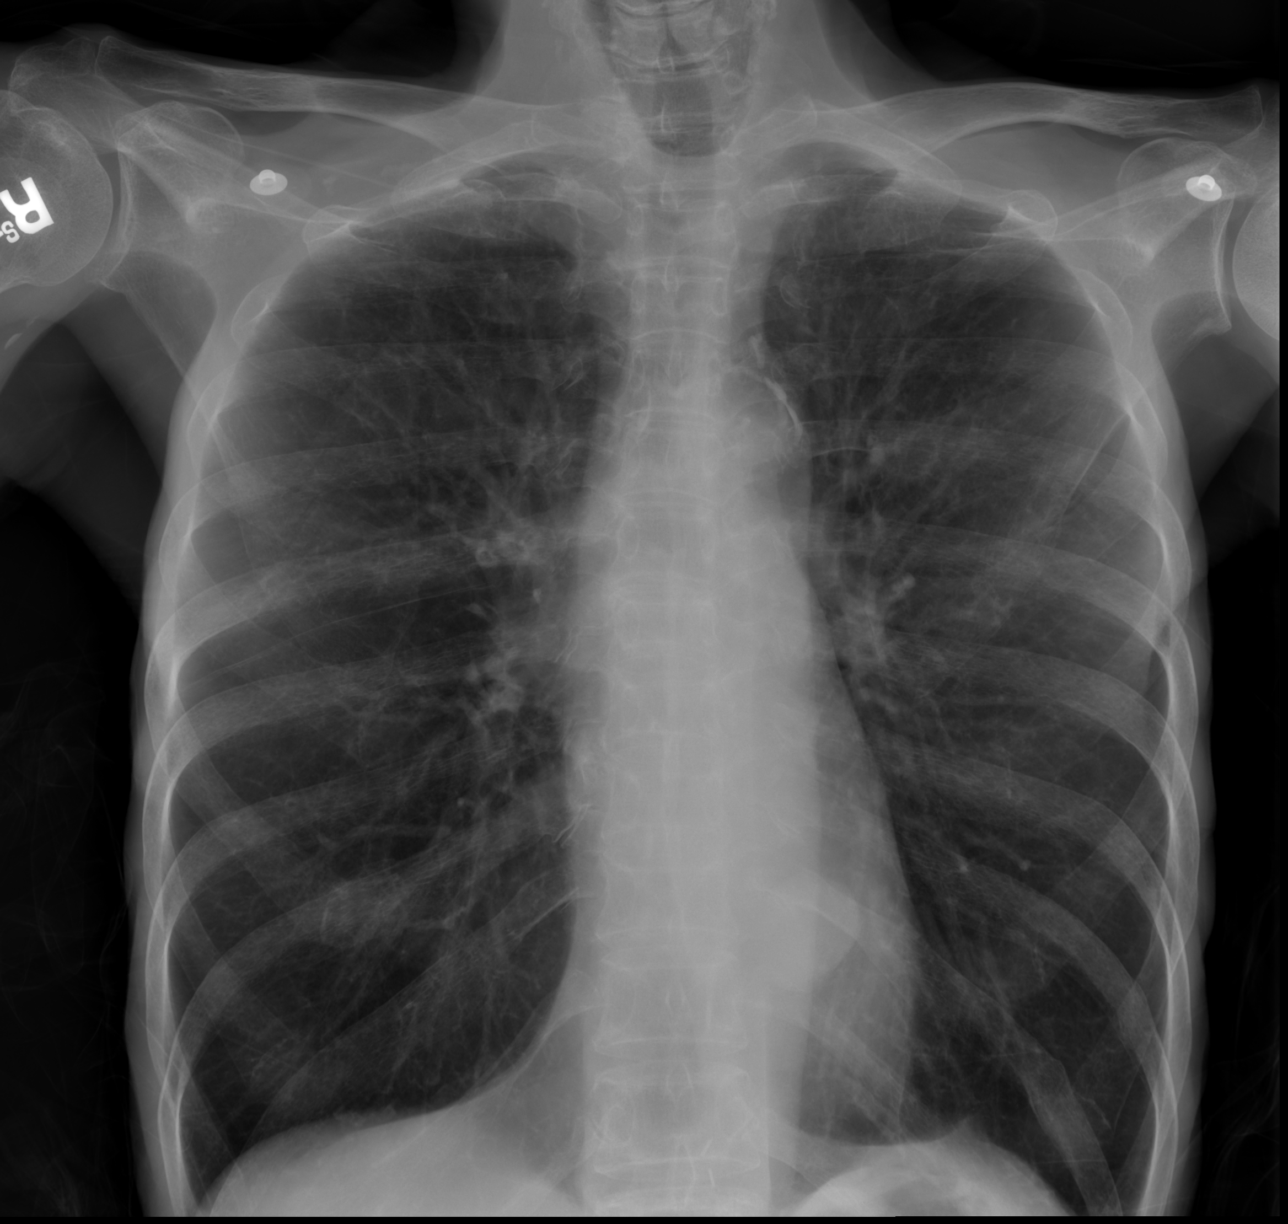

[x abdomen supine]
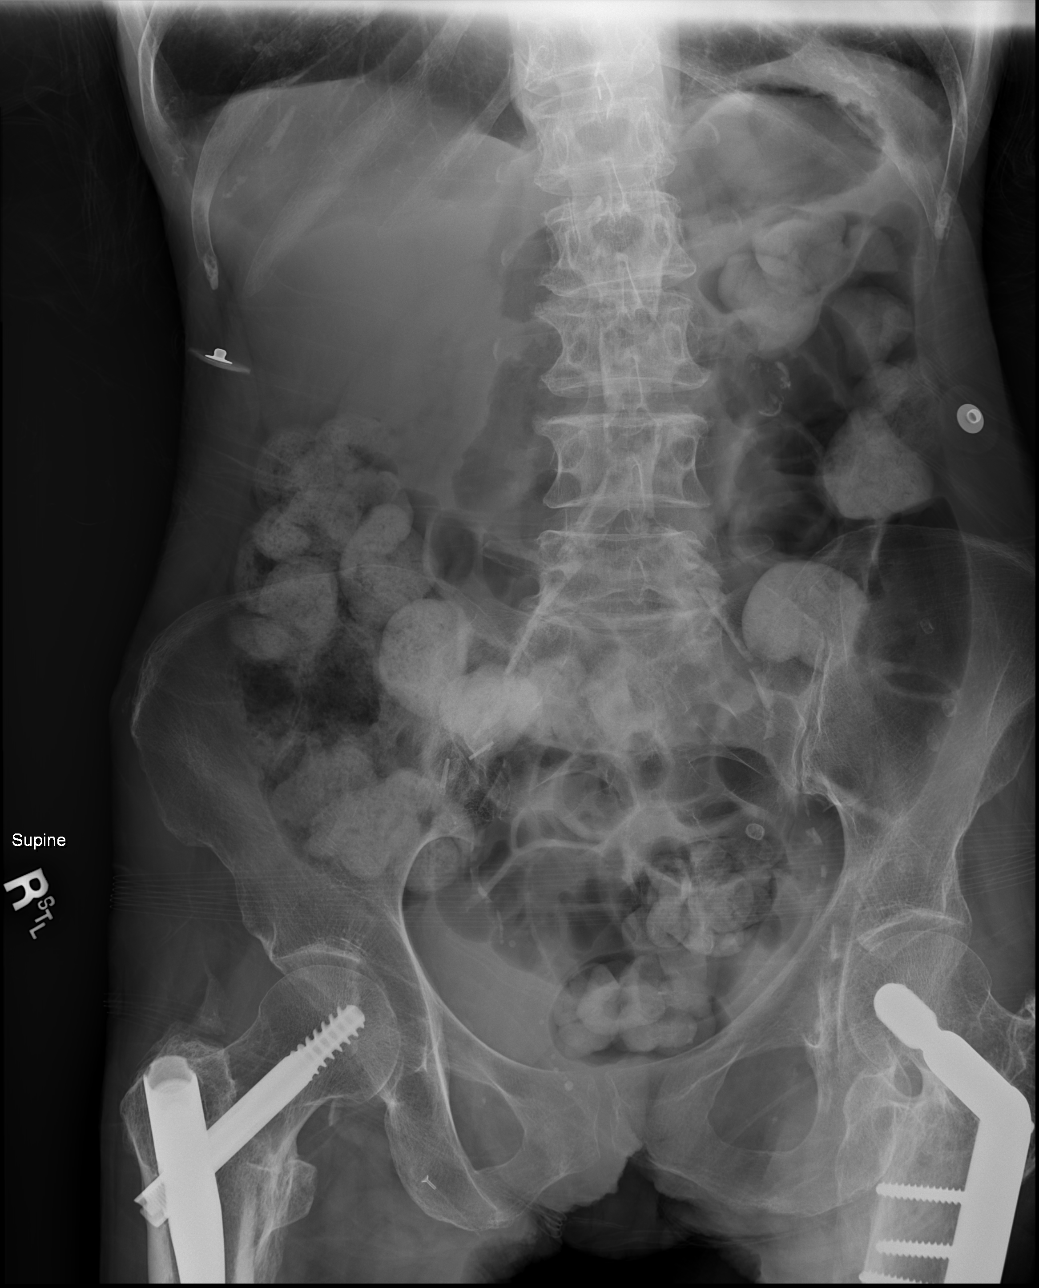

[w abdomen decub]
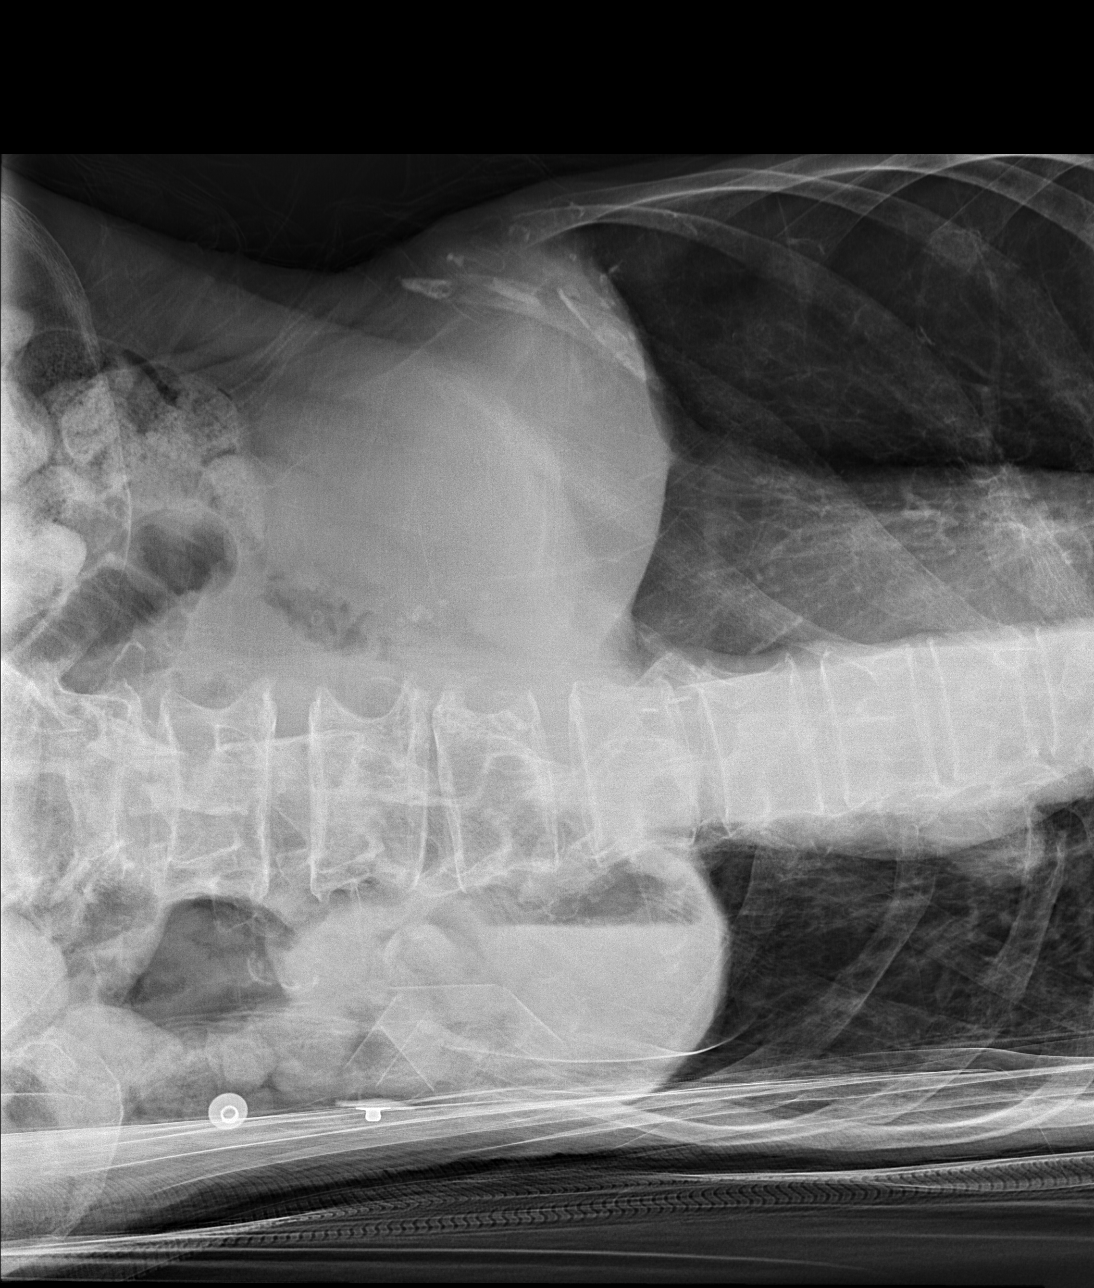

[3 of 3 positions shown; findings below may reference images not displayed]

FINDINGS: The heart size is normal. Atherosclerotic calcifications are present
at the aortic arch. Emphysematous changes are noted. The left
paraspinous 2.5 cm mass is again noted.

Bowel gas pattern is normal. There is no free air. No obstruction is
present. Postsurgical changes are present at both hips.
IMPRESSION: 1. No acute cardiopulmonary disease.
2. Left paraspinous mass.
3. Normal bowel gas pattern. No acute or focal lesion to explain
epigastric pain.

## 2020-03-14 IMAGING — CT CT RENAL STONE PROTOCOL
2 of 4 series · 15 of 46 positions shown, 17 images · non-contrast
Comparison: CT abdomen and pelvis August 22, 2017

CLINICAL DATA: Recurring urinary tract infection. History of
substance abuse, pancreatitis, renal cell carcinoma.

EXAM:
CT ABDOMEN AND PELVIS WITHOUT CONTRAST
TECHNIQUE: Multidetector CT imaging of the abdomen and pelvis was performed
following the standard protocol without IV contrast.

[Series 2: axial st · axial · 0.58mm/px · z∈[-821,-481]mm · 12 of 76 slices shown, 14 images]
[im 4/76  soft-tissue]
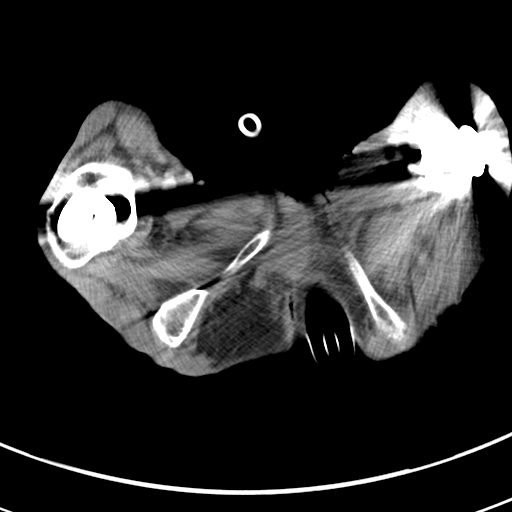
[im 4/76  bone]
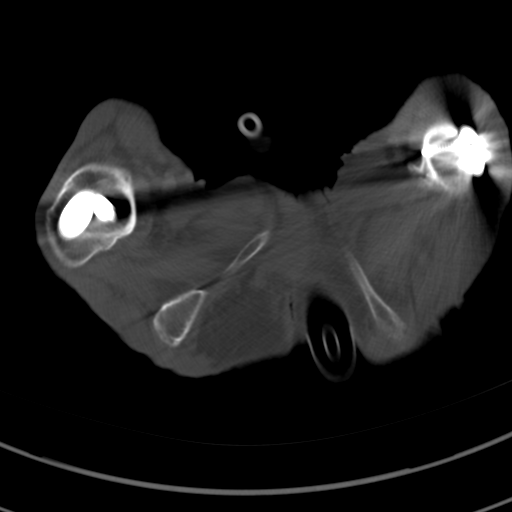
[im 12/76  soft-tissue]
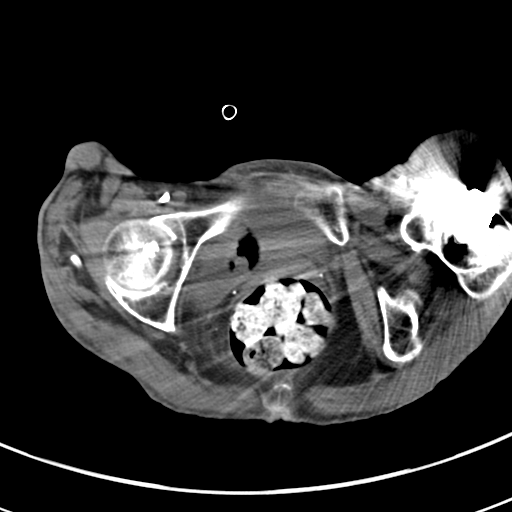
[im 16/76  soft-tissue]
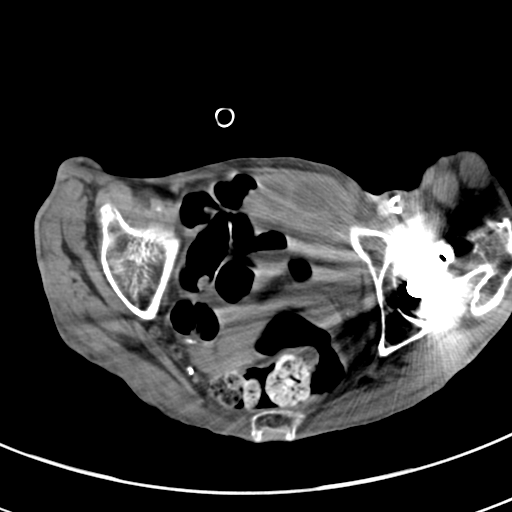
[im 24/76  soft-tissue]
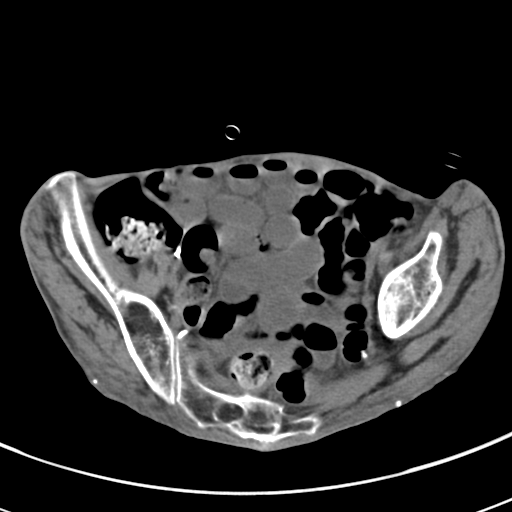
[im 28/76  soft-tissue]
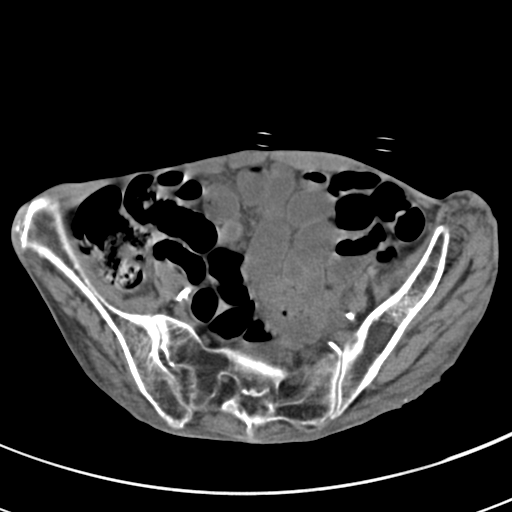
[im 36/76  soft-tissue]
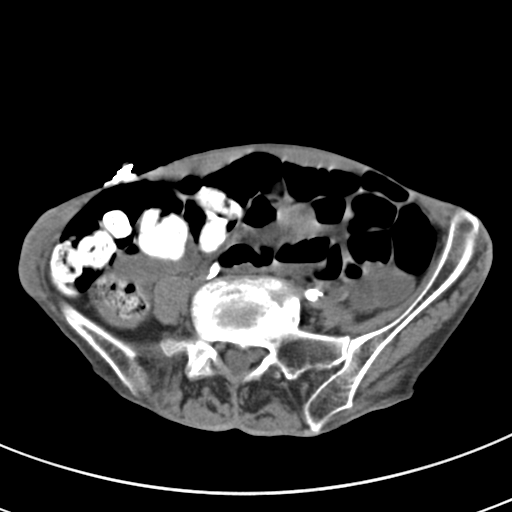
[im 40/76  soft-tissue]
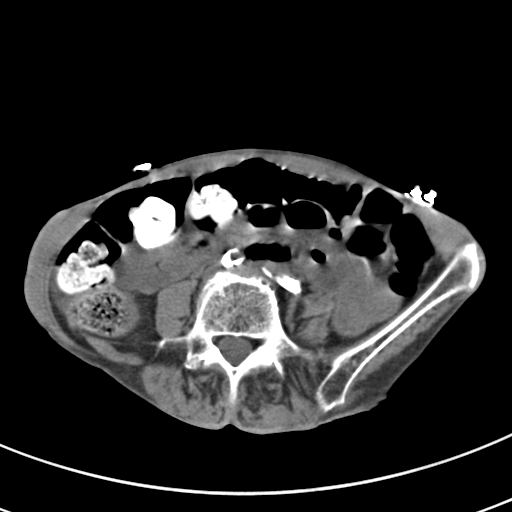
[im 48/76  soft-tissue]
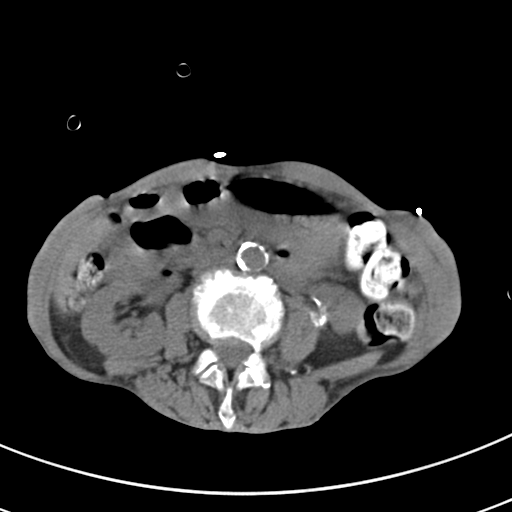
[im 52/76  soft-tissue]
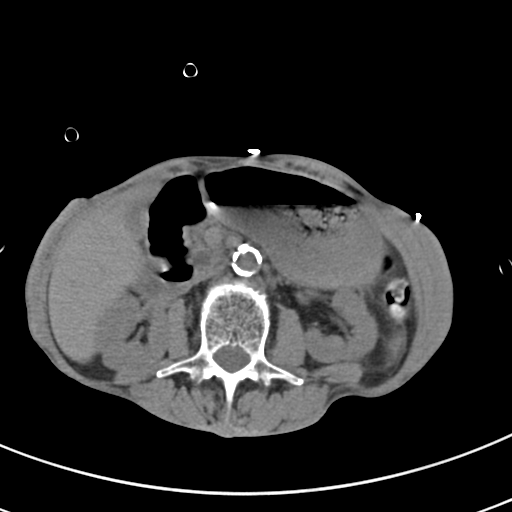
[im 52/76  bone]
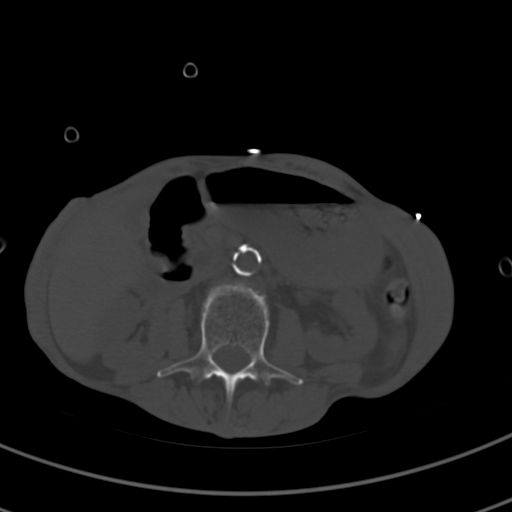
[im 60/76  soft-tissue]
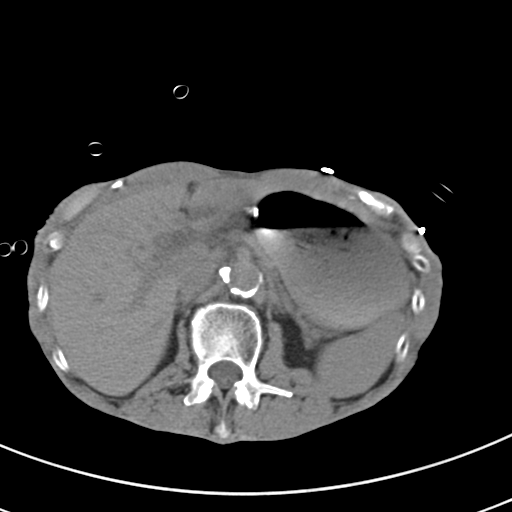
[im 64/76  soft-tissue]
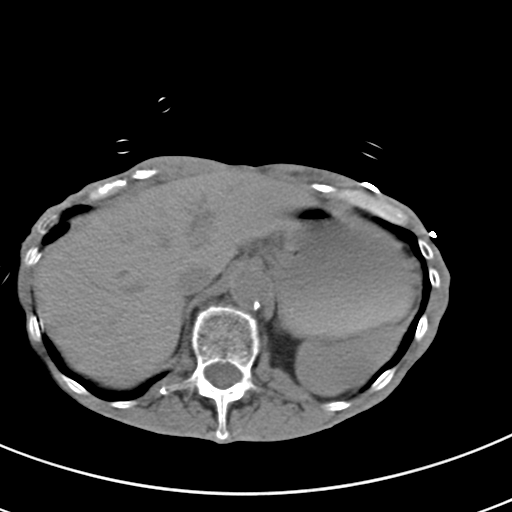
[im 72/76  soft-tissue]
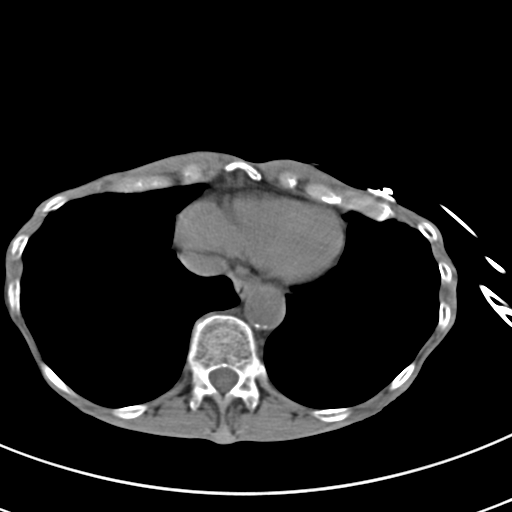

[Series 4: coronal · coronal · 0.51mm/px · 3 of 83 slices shown]
[im 28/83  soft-tissue]
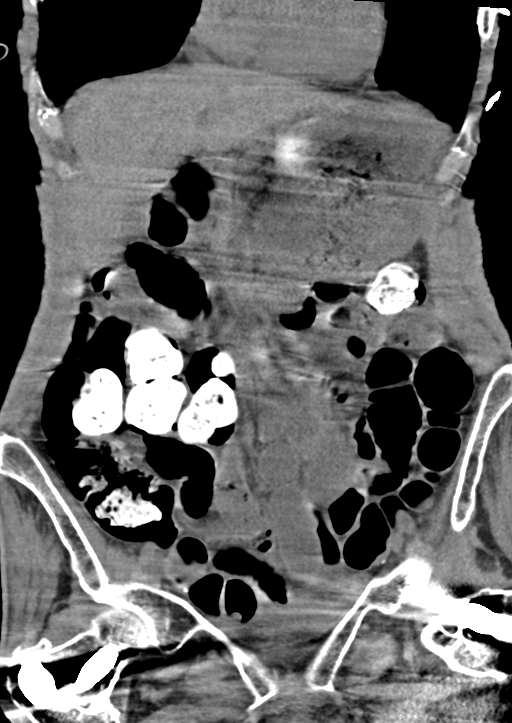
[im 37/83  soft-tissue]
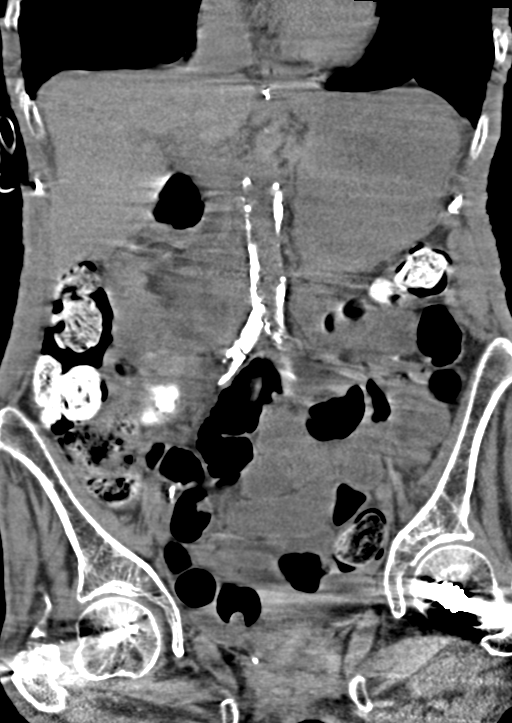
[im 46/83  soft-tissue]
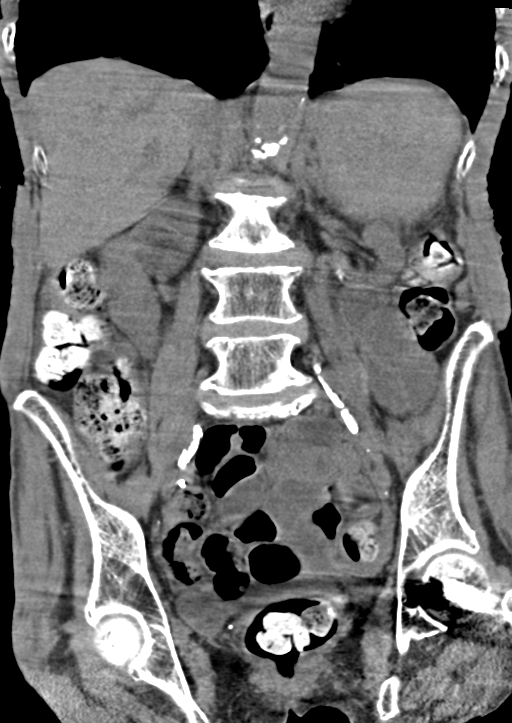

[15 of 46 positions shown; findings below may reference images not displayed]

FINDINGS: Moderate respiratory motion degraded examination.

LOWER CHEST: Lung bases are clear. The visualized heart size is
normal. Small pericardial effusion.

HEPATOBILIARY: Negative liver. Similar mild biliary dilatation
better demonstrated on recent contrast-enhanced CT. No gallbladder
distension or definite cholelithiasis though limited by motion.

PANCREAS: Normal.

SPLEEN: Punctate splenic granuloma.

ADRENALS/URINARY TRACT: Kidneys are orthotopic, demonstrating normal
size and morphology. Partially calcified 13 mm LEFT lower pole mass
at site of prior ablation. No nephrolithiasis, hydronephrosis;
limited assessment for renal masses by nonenhanced CT. The
unopacified ureters are normal in course and caliber. Urinary
bladder is partially distended and unremarkable. Normal adrenal
glands.

STOMACH/BOWEL: The stomach, small and large bowel are normal in
course and caliber without inflammatory changes, sensitivity
decreased by lack of enteric contrast. Surgical suture material at
gastric antrum. Mild stool distended rectum with moderate amount of
retained large bowel stool. Normal appendix.

VASCULAR/LYMPHATIC: Aortoiliac vessels are normal in course and
caliber. Severe calcific atherosclerosis. No lymphadenopathy by CT
size criteria.

REPRODUCTIVE: Status post hysterectomy.

OTHER: No intraperitoneal free fluid or free air.

MUSCULOSKELETAL: Non-acute. Streak artifact from bilateral femoral
neck pinning. Cachexia. Osteopenia. Grade 1 L2-3 retrolisthesis.
IMPRESSION: 1. Moderately motion degraded examination.
2. No urolithiasis or obstructive uropathy. Post treatment changes
LEFT kidney.
3. Moderate amount of retained large bowel stool without bowel
obstruction.

Aortic Atherosclerosis ([id number].B).

## 2020-03-14 IMAGING — CR DG CHEST 2V
2 series · 2 of 2 positions shown · non-contrast
Comparison: 08/28/2017

CLINICAL DATA: Cough and COPD

EXAM:
CHEST - 2 VIEW

[w chest lat]
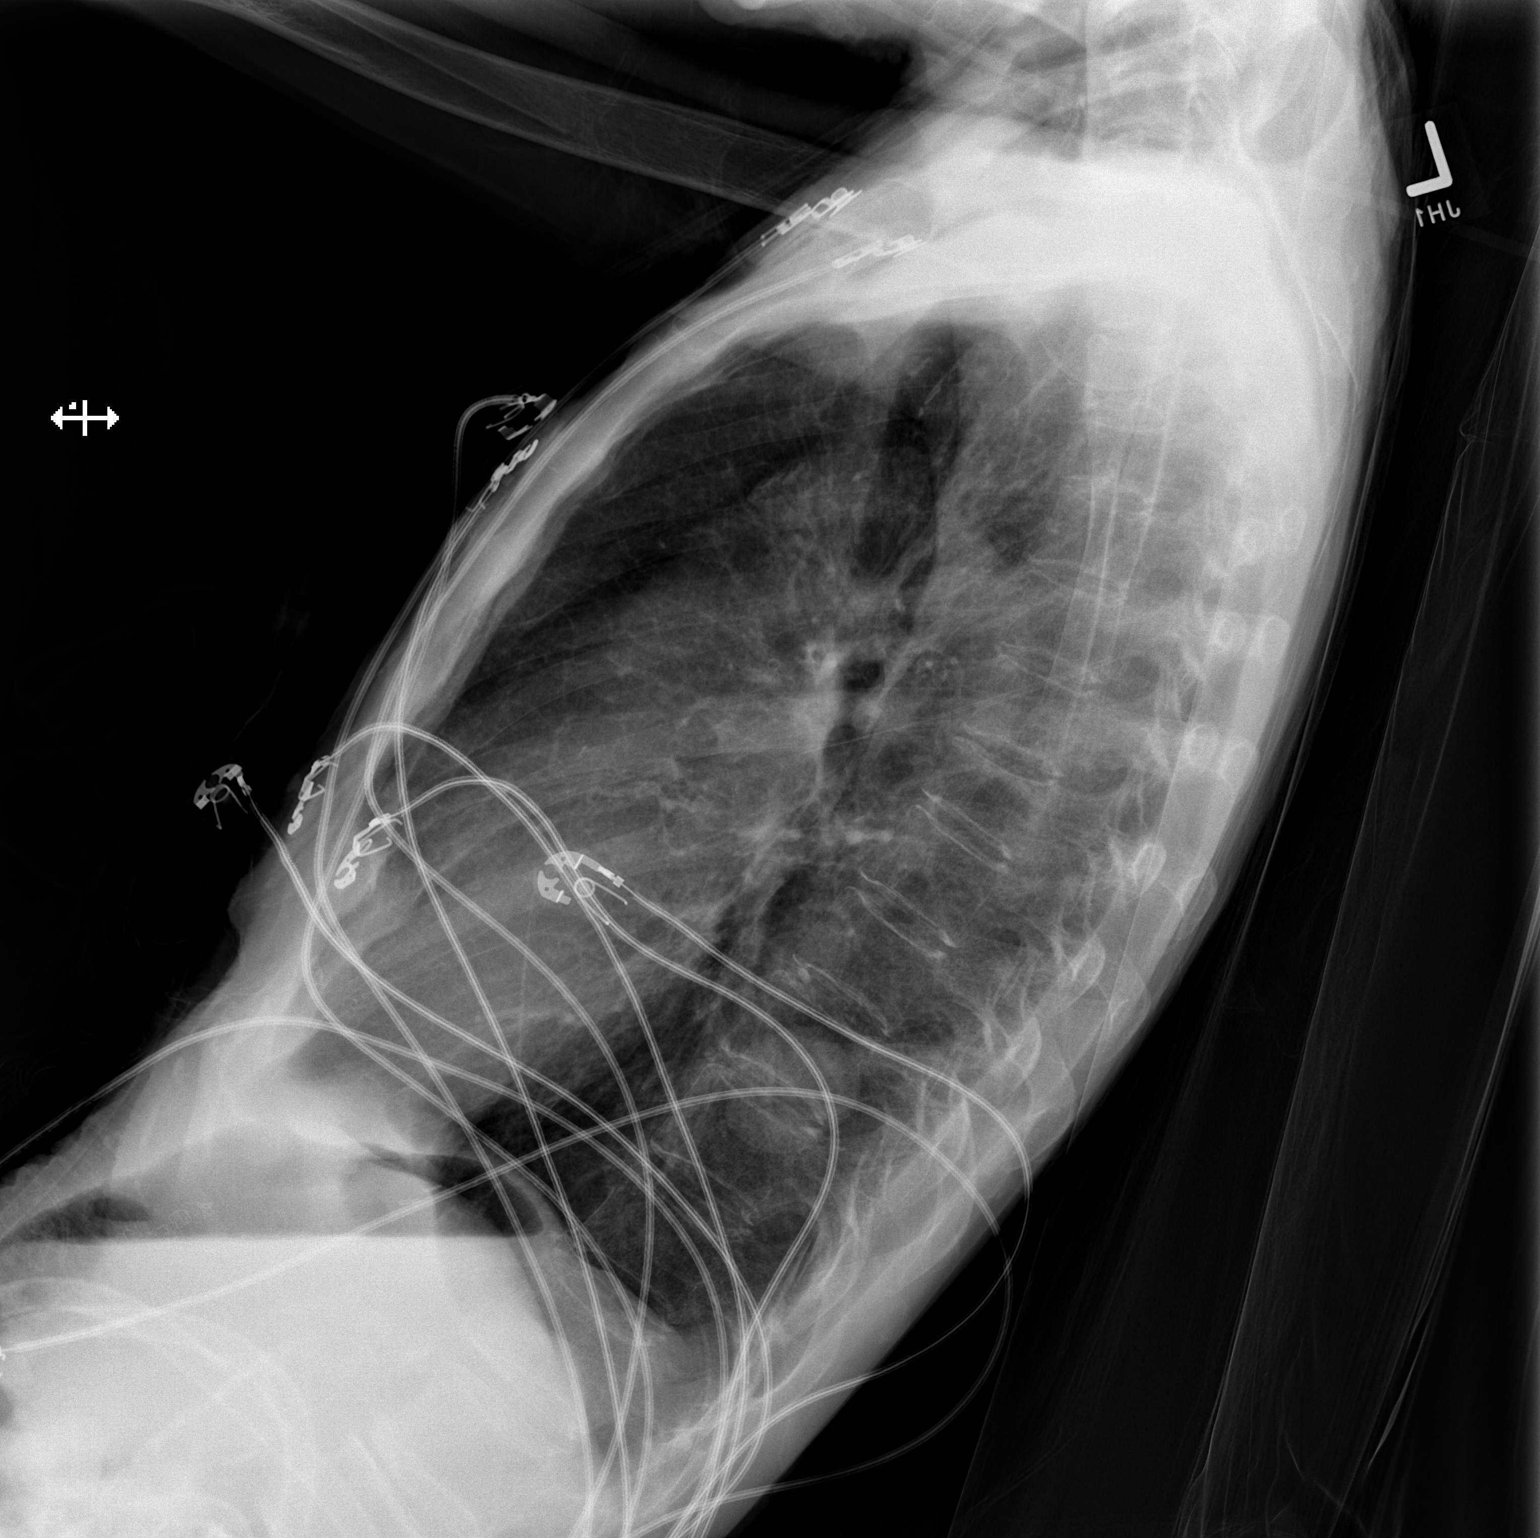

[x chest ap]
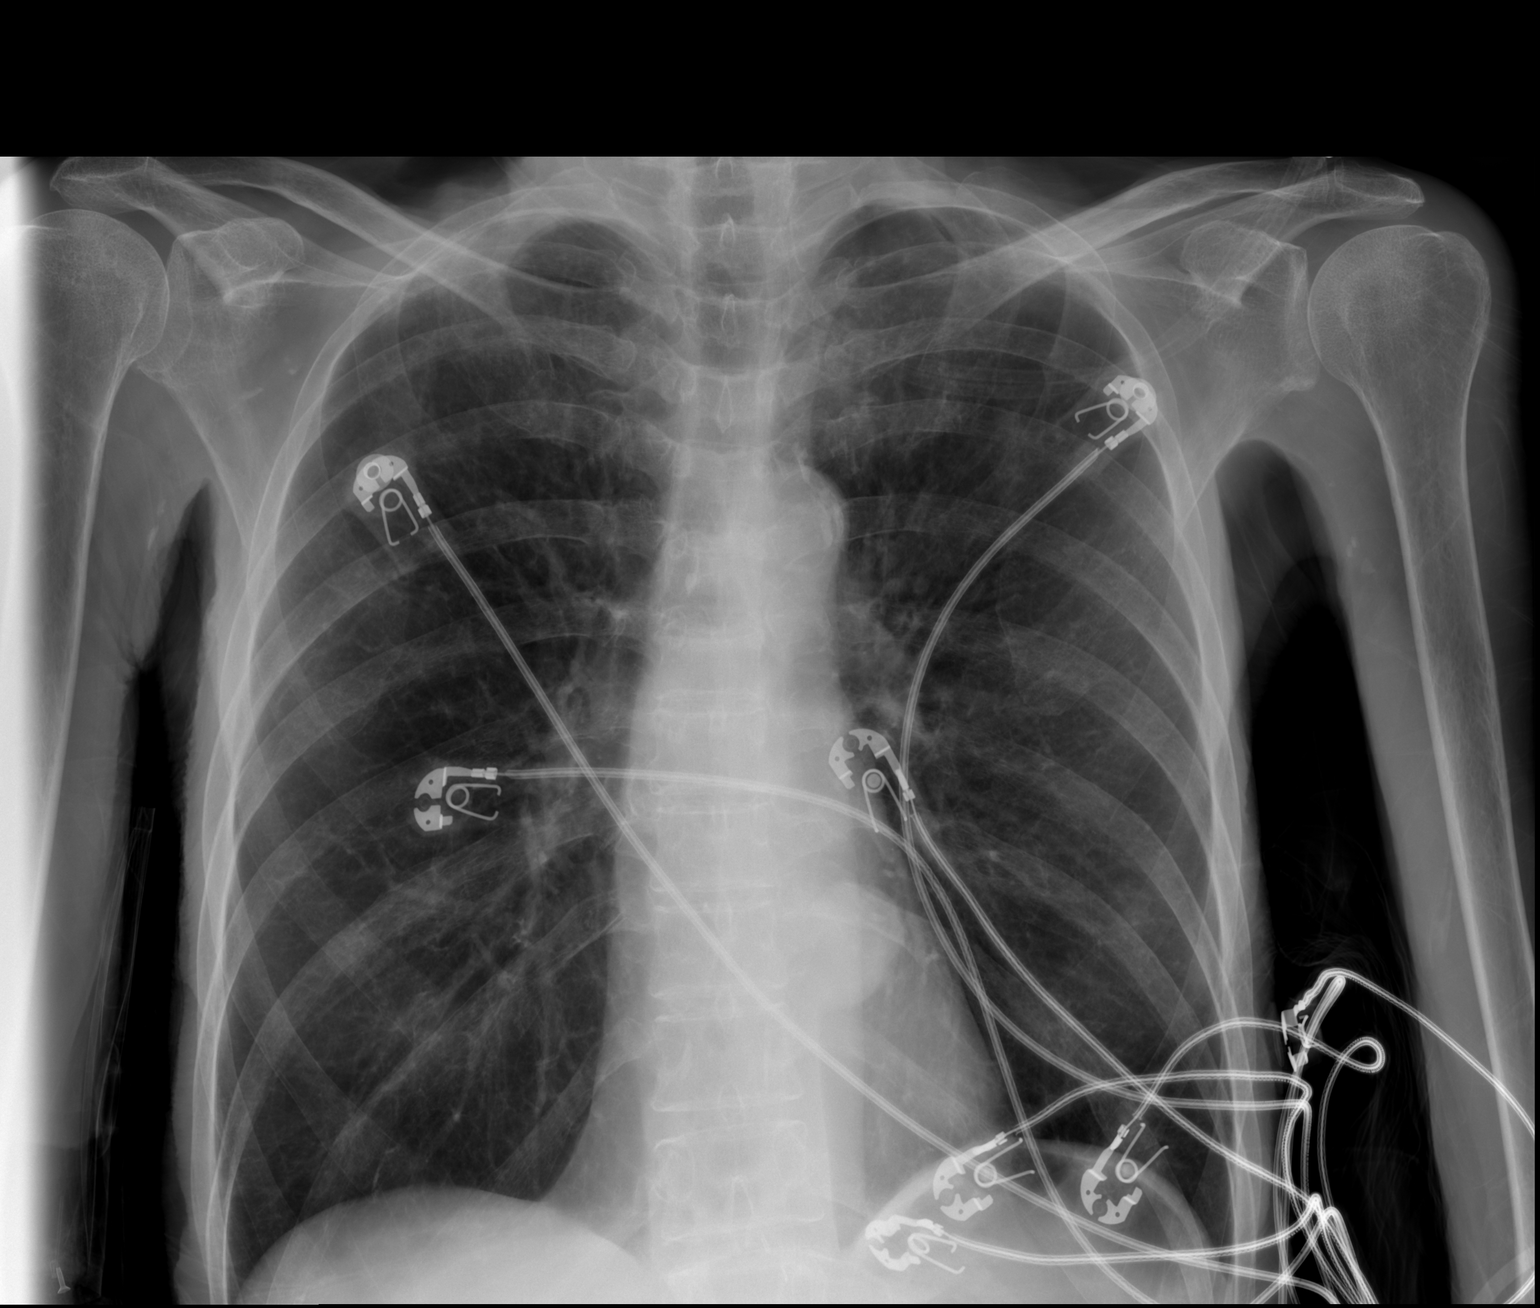

[2 of 2 positions shown; findings below may reference images not displayed]

FINDINGS: The lungs are hyperinflated with diffuse interstitial prominence. No
focal airspace consolidation or pulmonary edema. No pleural effusion
or pneumothorax. Normal cardiomediastinal contours. Unchanged
appearance of left paraspinous mass.
IMPRESSION: 1. COPD without focal airspace disease.
2. Unchanged appearance of left paramedian mass.

## 2020-03-18 IMAGING — CR DG CHEST 2V
2 series · 2 of 2 positions shown · non-contrast
Comparison: PA and lateral chest 08/30/2017. CT chest, abdomen and
pelvis 01/15/2017.

CLINICAL DATA: Diffuse pain.

EXAM:
CHEST - 2 VIEW

[w chest lat]
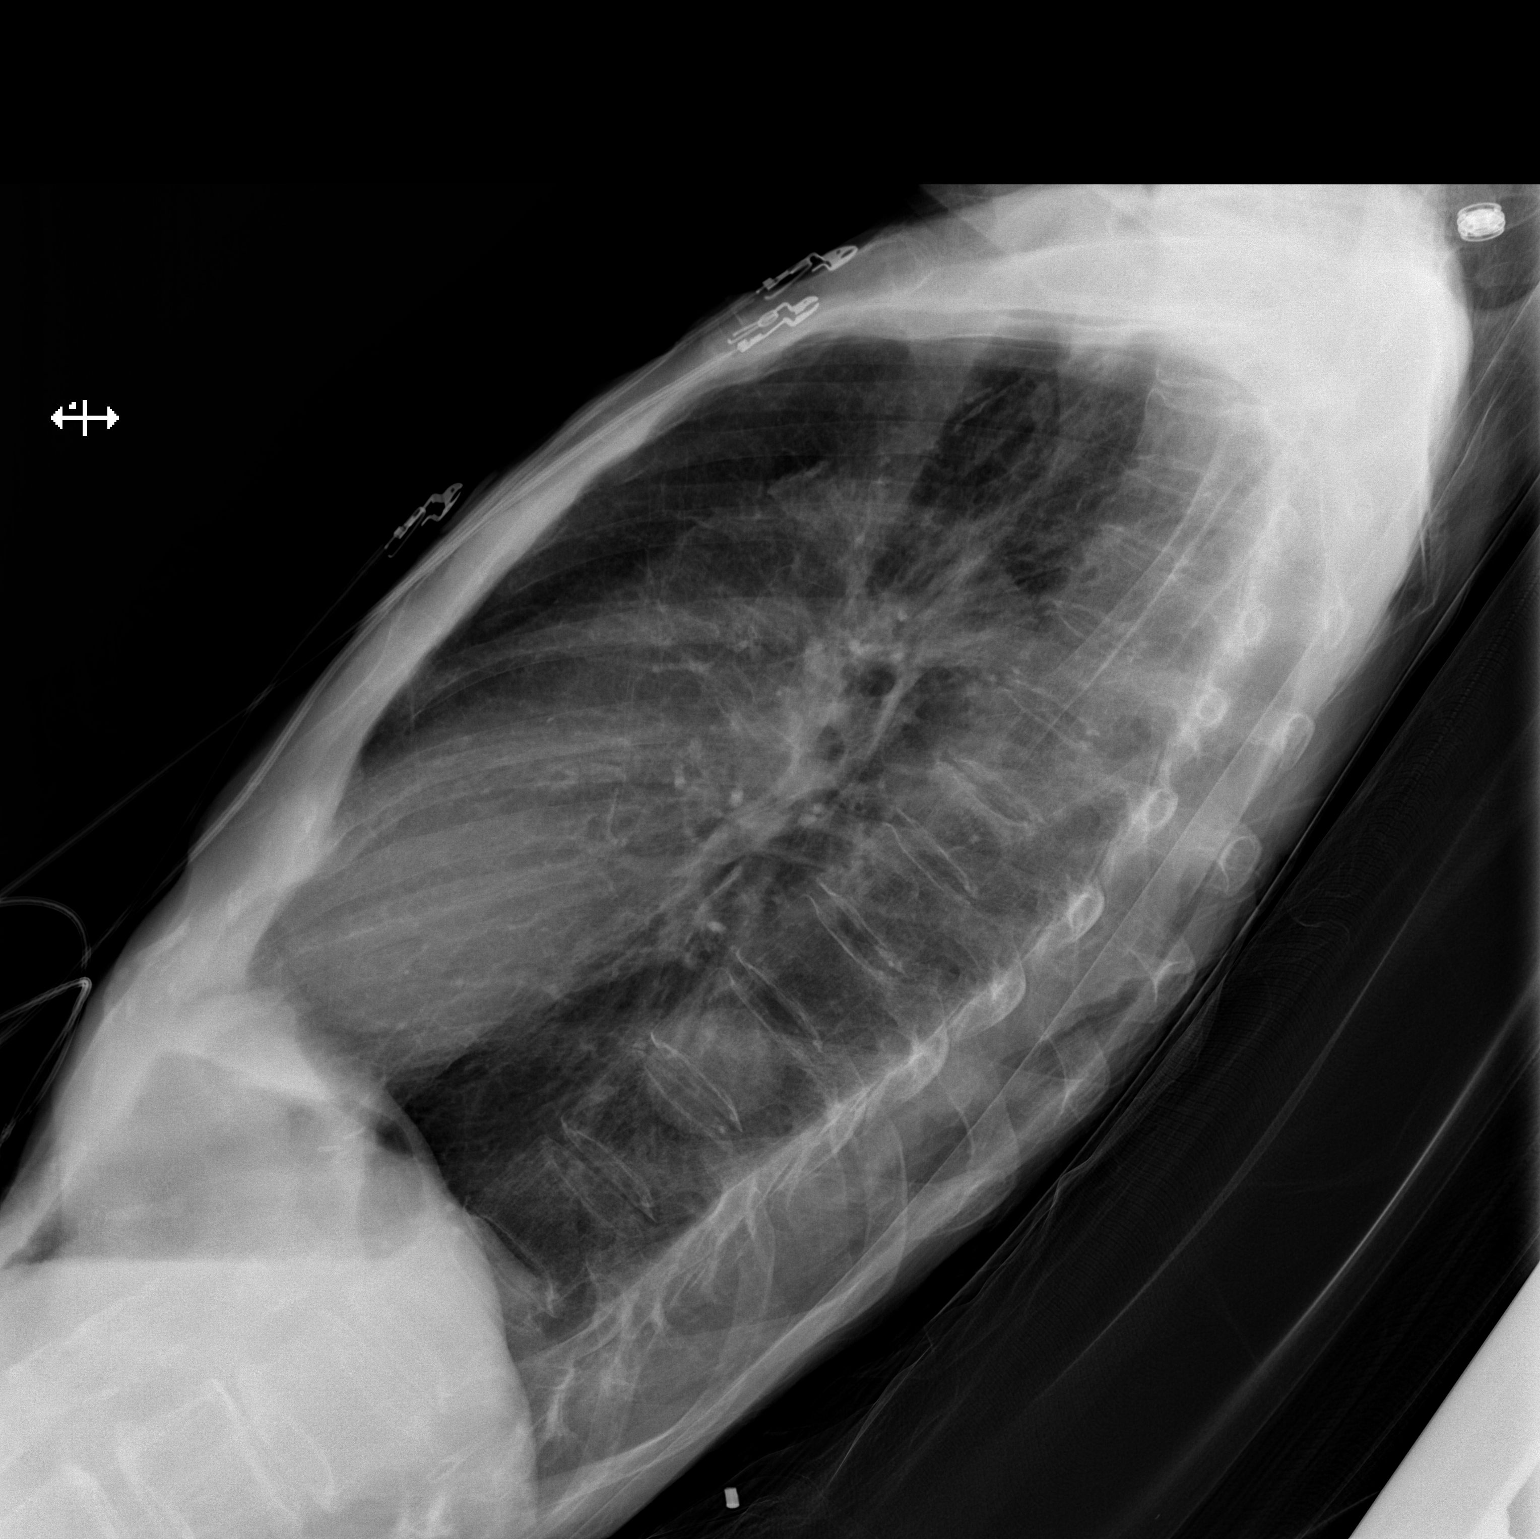

[x chest ap]
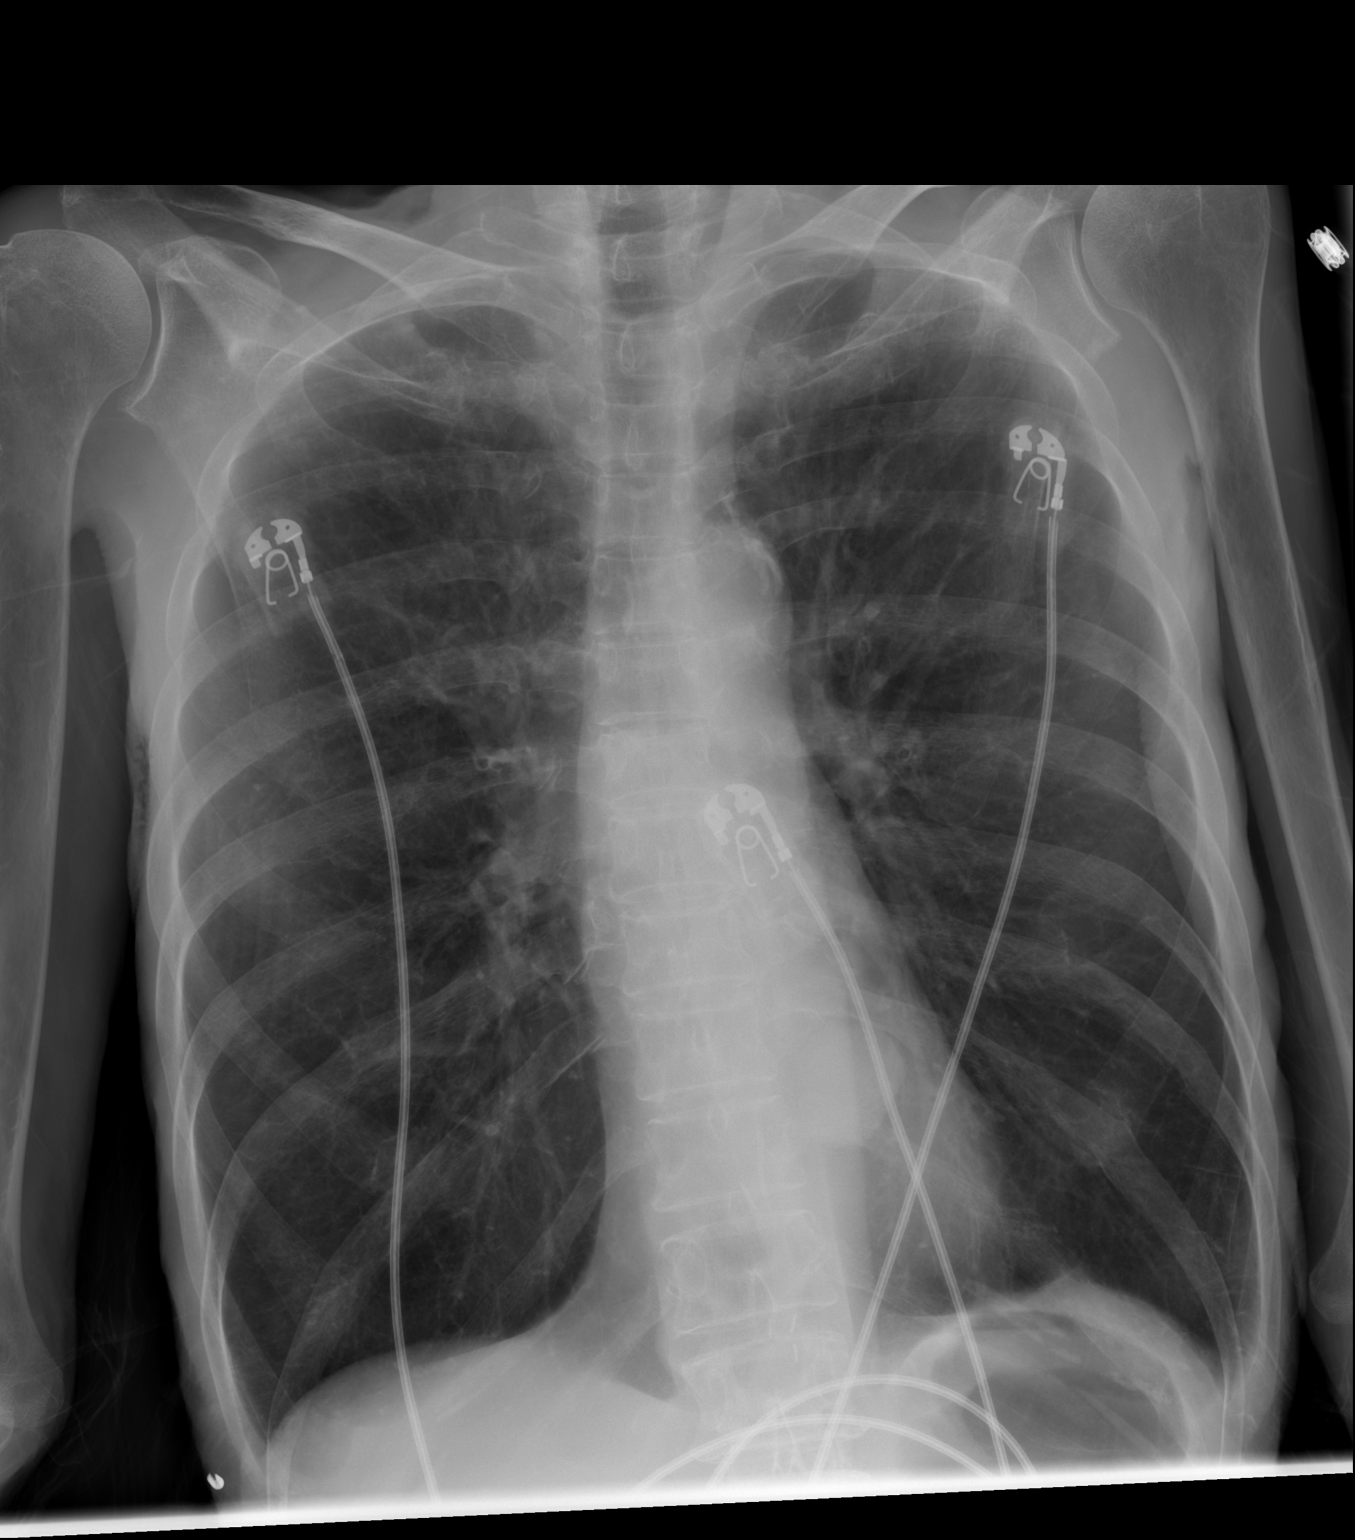

[2 of 2 positions shown; findings below may reference images not displayed]

FINDINGS: The lungs are markedly emphysematous but clear. Heart size is
normal. No pneumothorax or pleural effusion. No acute or focal bony
abnormality.
IMPRESSION: Severe emphysema without acute disease.

## 2020-03-19 IMAGING — CR DG CHEST 2V
2 series · 2 of 2 positions shown · non-contrast
Comparison: Chest radiograph 09/03/2017

CLINICAL DATA: Patient with productive cough

EXAM:
CHEST - 2 VIEW

[w chest lat]
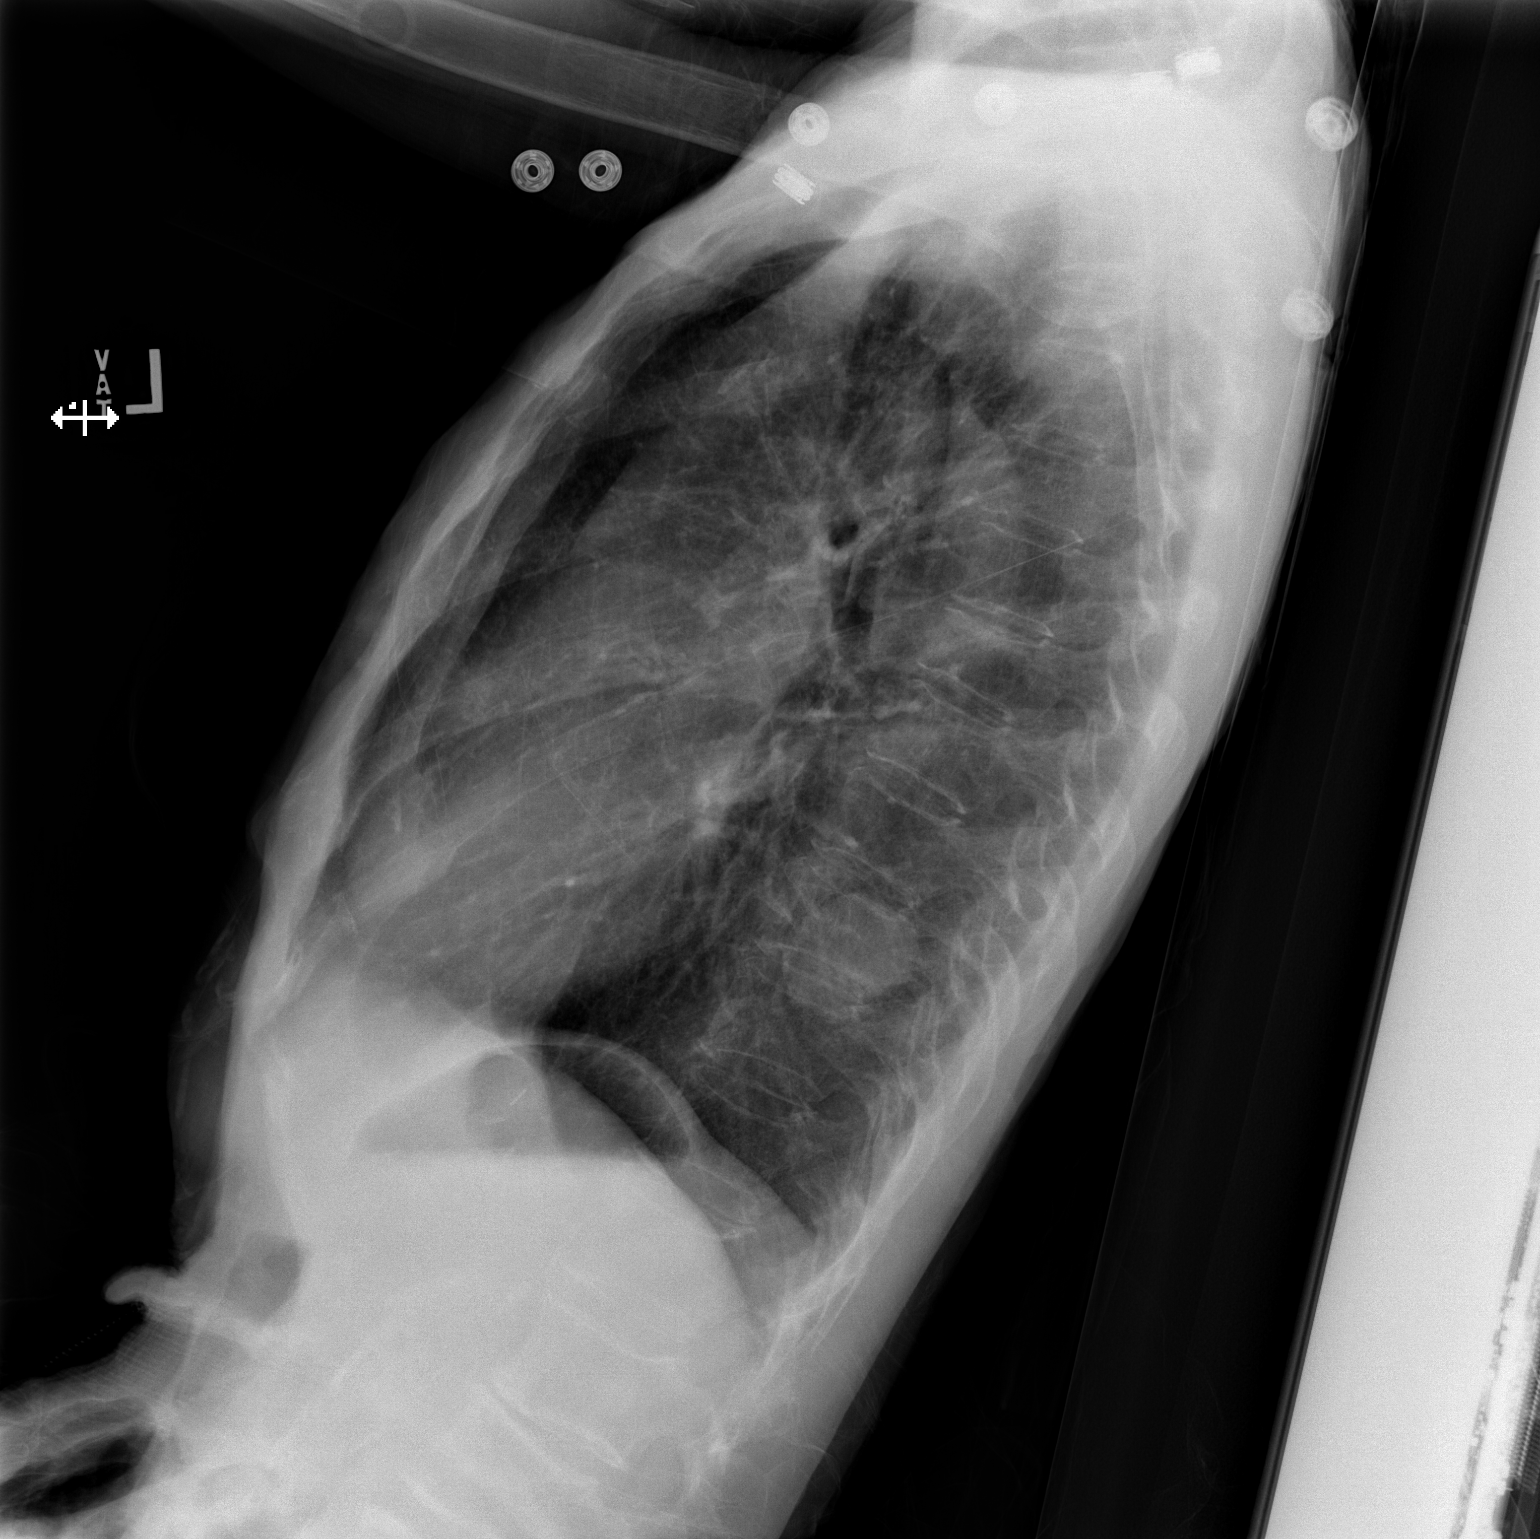

[x chest ap]
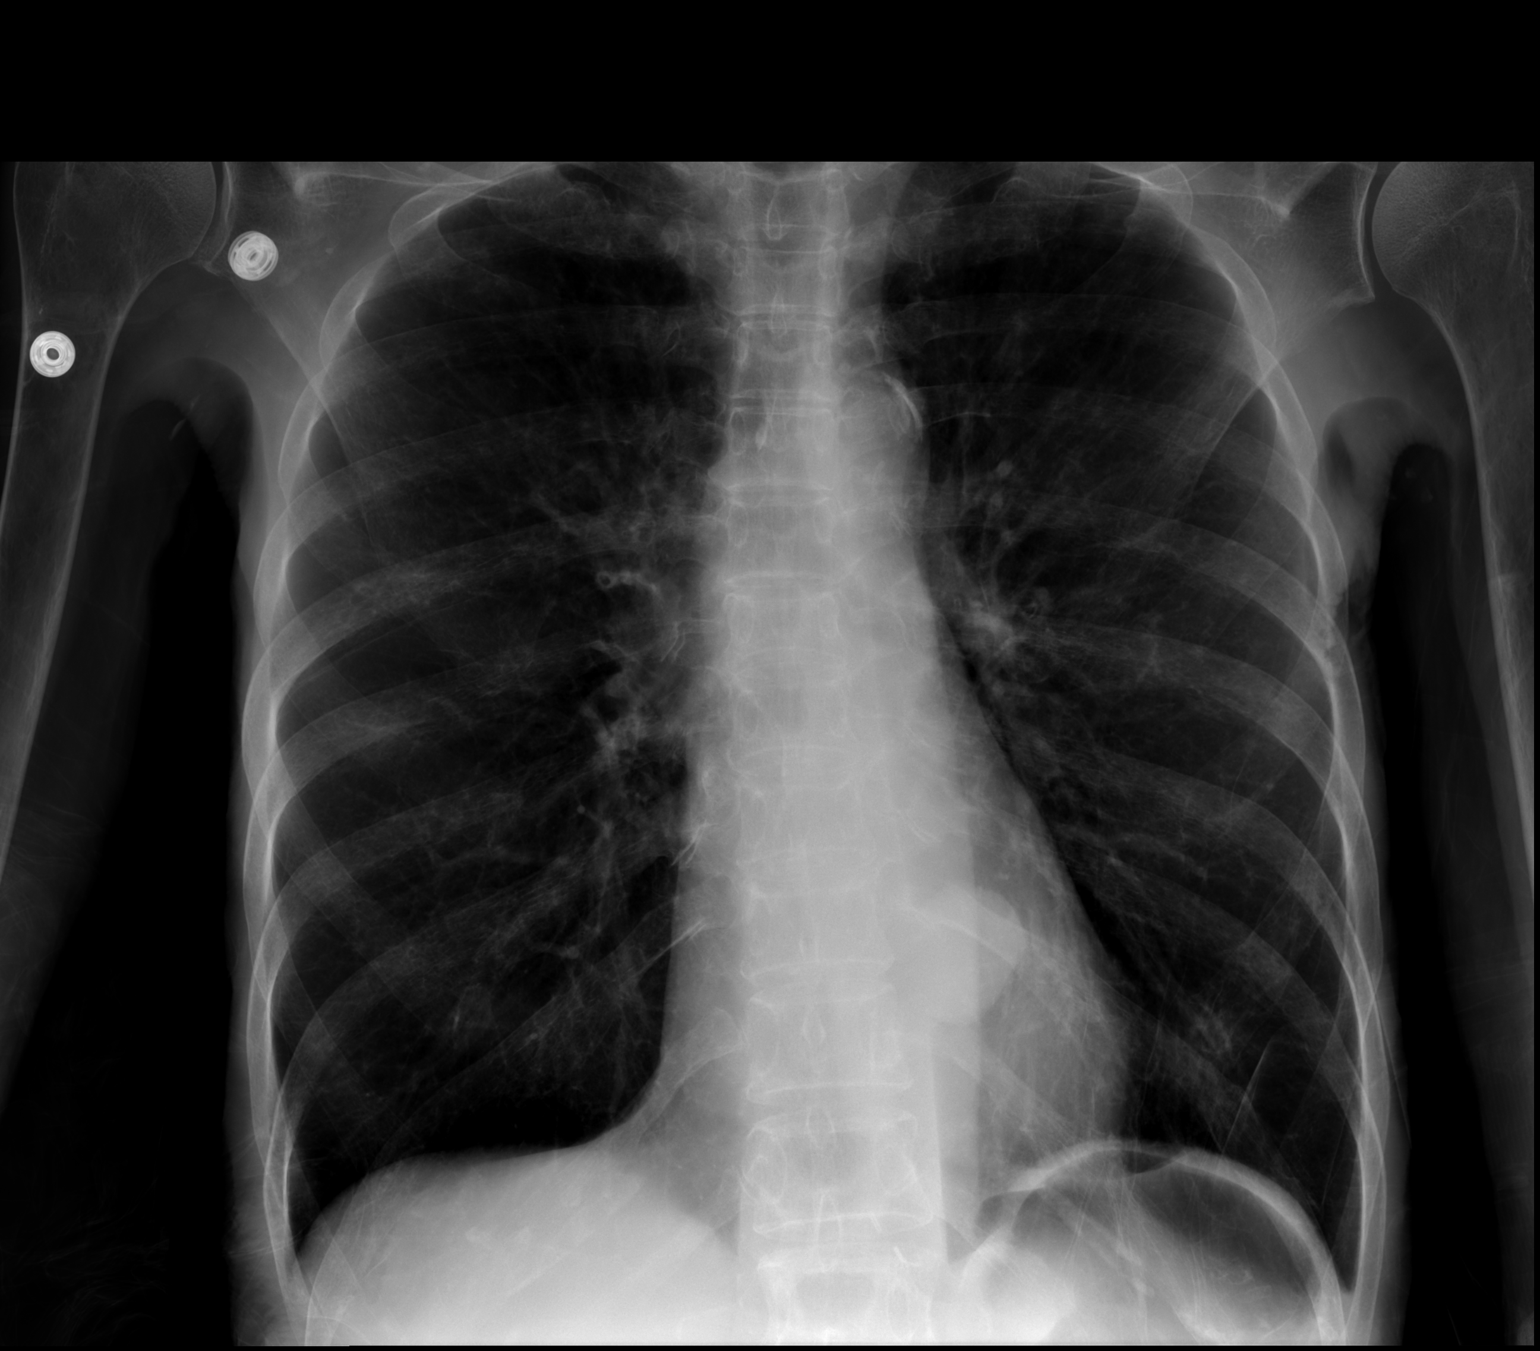

[2 of 2 positions shown; findings below may reference images not displayed]

FINDINGS: Stable cardiac and mediastinal contours. Re-demonstrated 3.4 cm mass
projecting over the posterior left lower lung. Pulmonary
hyperinflation. Emphysematous change. No large area pulmonary
consolidation. No pleural effusion or pneumothorax. Thoracic spine
degenerative changes.
IMPRESSION: There is a 3.4 cm mass projecting over the medial left lower lung.
This needs dedicated evaluation with contrast-enhanced chest CT.
Considerations include primary pulmonary malignancy or metastatic
disease.

## 2020-04-01 IMAGING — CR DG CHEST 2V
2 series · 2 of 2 positions shown · non-contrast
Comparison: 09/15/2017

CLINICAL DATA: Shortness of breath

EXAM:
CHEST - 2 VIEW

[w chest lat]
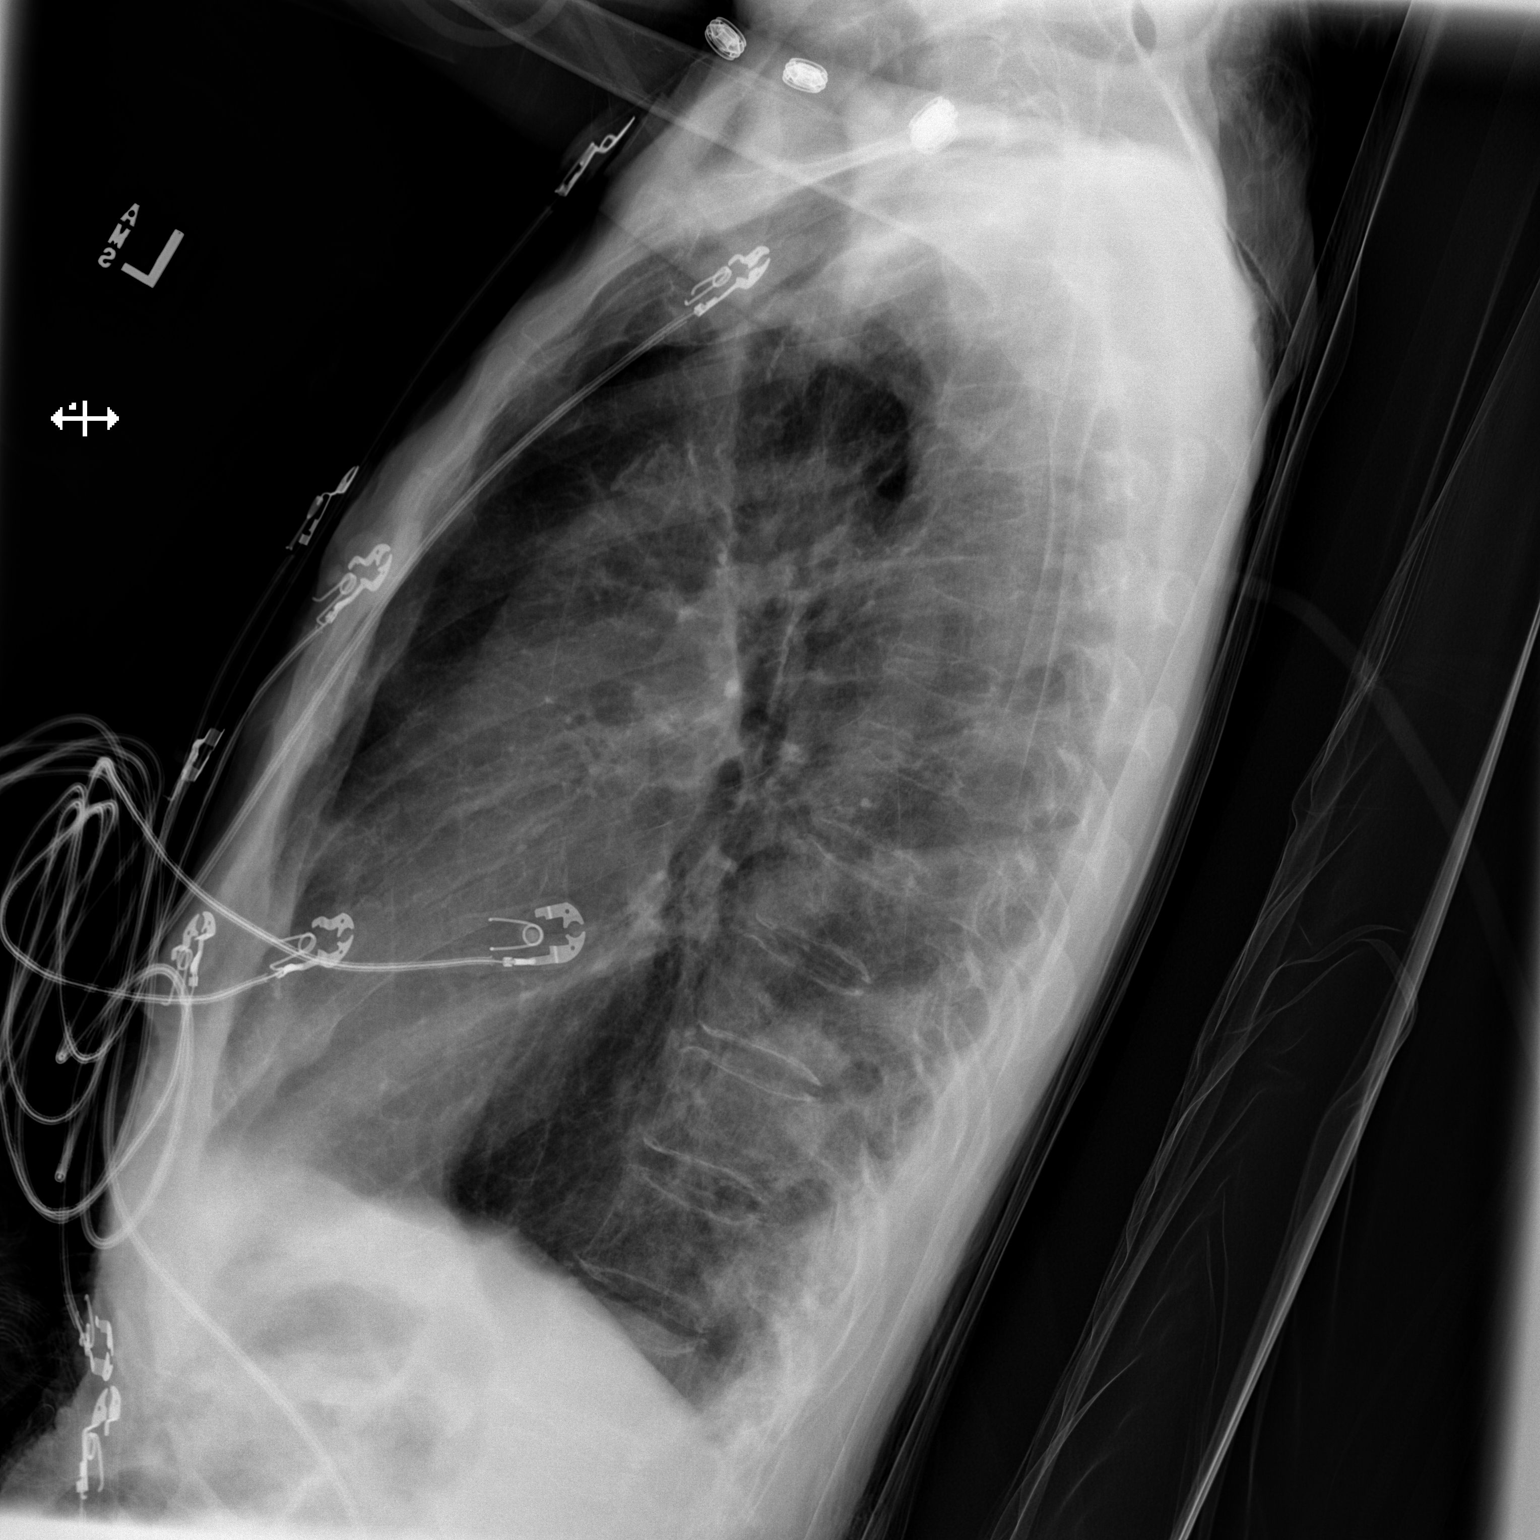

[x chest ap]
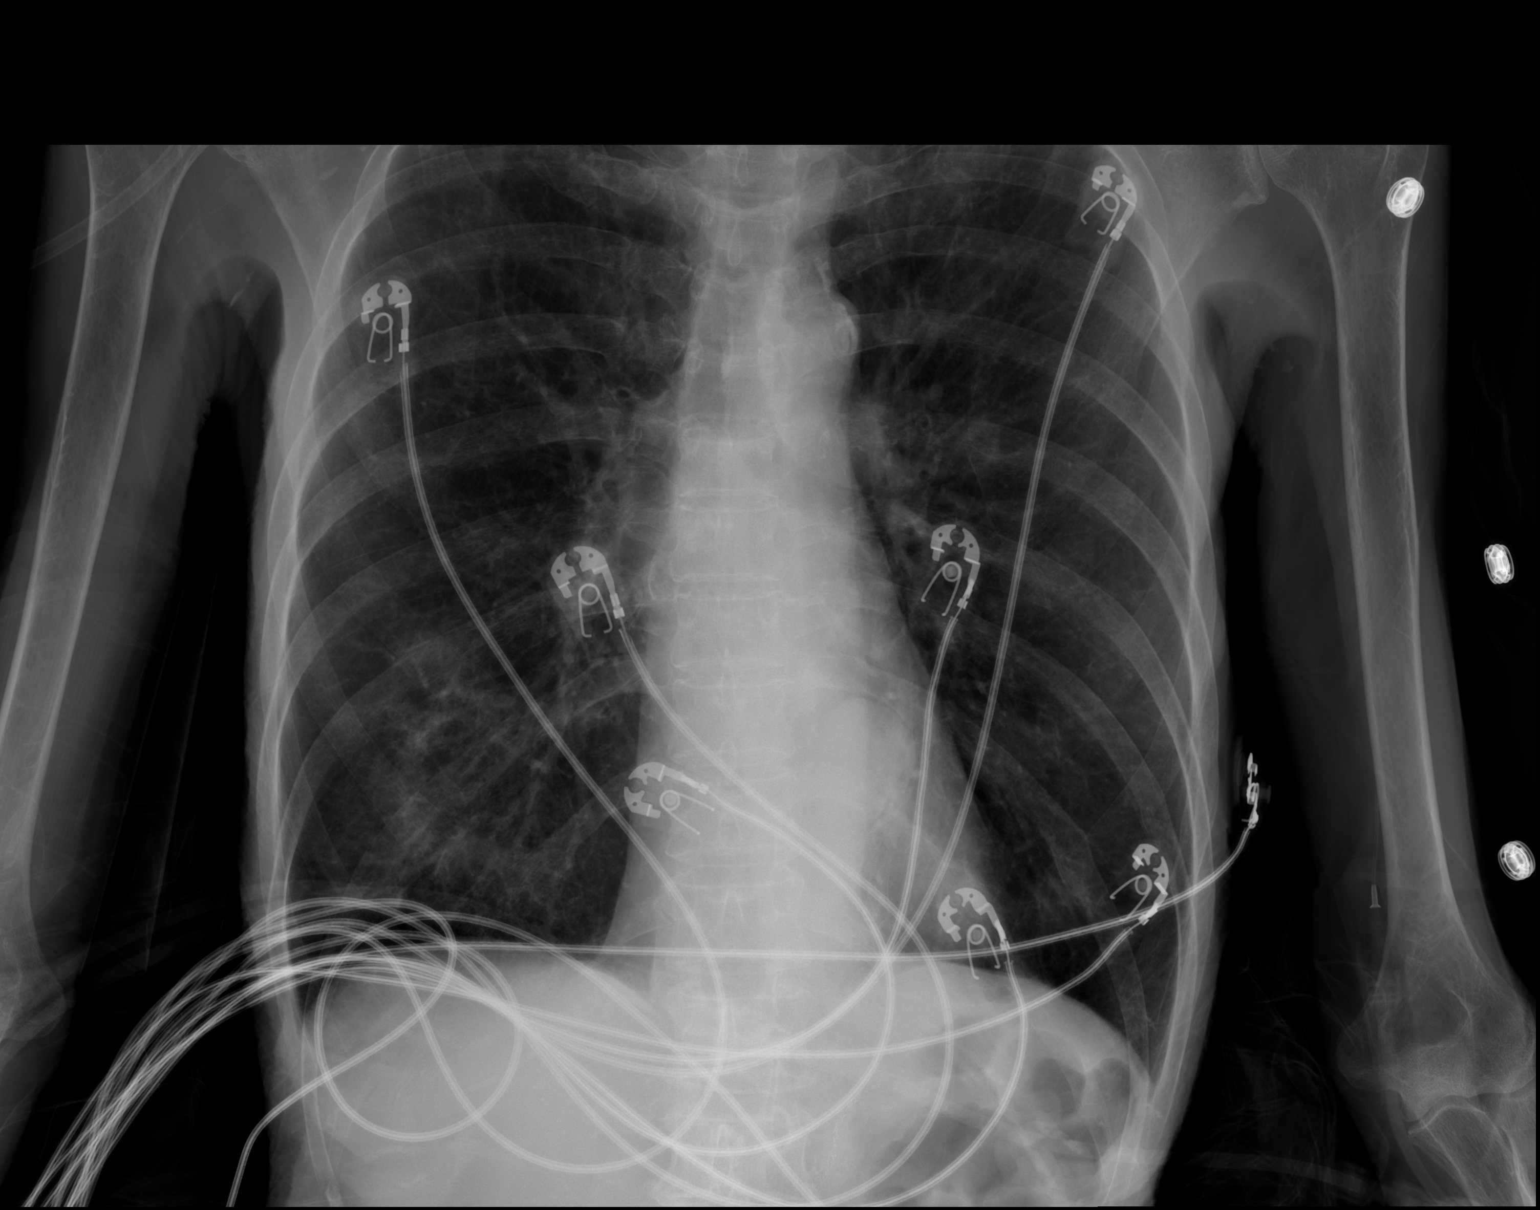

[2 of 2 positions shown; findings below may reference images not displayed]

FINDINGS: Cardiac shadow is stable. A retrocardiac mass lesion is again
identified similar to that seen on the prior exam on the left.
Patchy infiltrate is again noted in the right lung base stable from
the previous exam. No new focal abnormality is noted. No bony
abnormality is seen.
IMPRESSION: Stable right lower lobe infiltrate.

Stable left lower lobe mass.

## 2020-04-15 IMAGING — DX DG CHEST 1V PORT
1 series · 1 of 1 positions shown · non-contrast
Comparison: CT 09/18/2017, radiograph 09/17/2017

CLINICAL DATA: Shortness of breath

EXAM:
PORTABLE CHEST 1 VIEW

[chest]
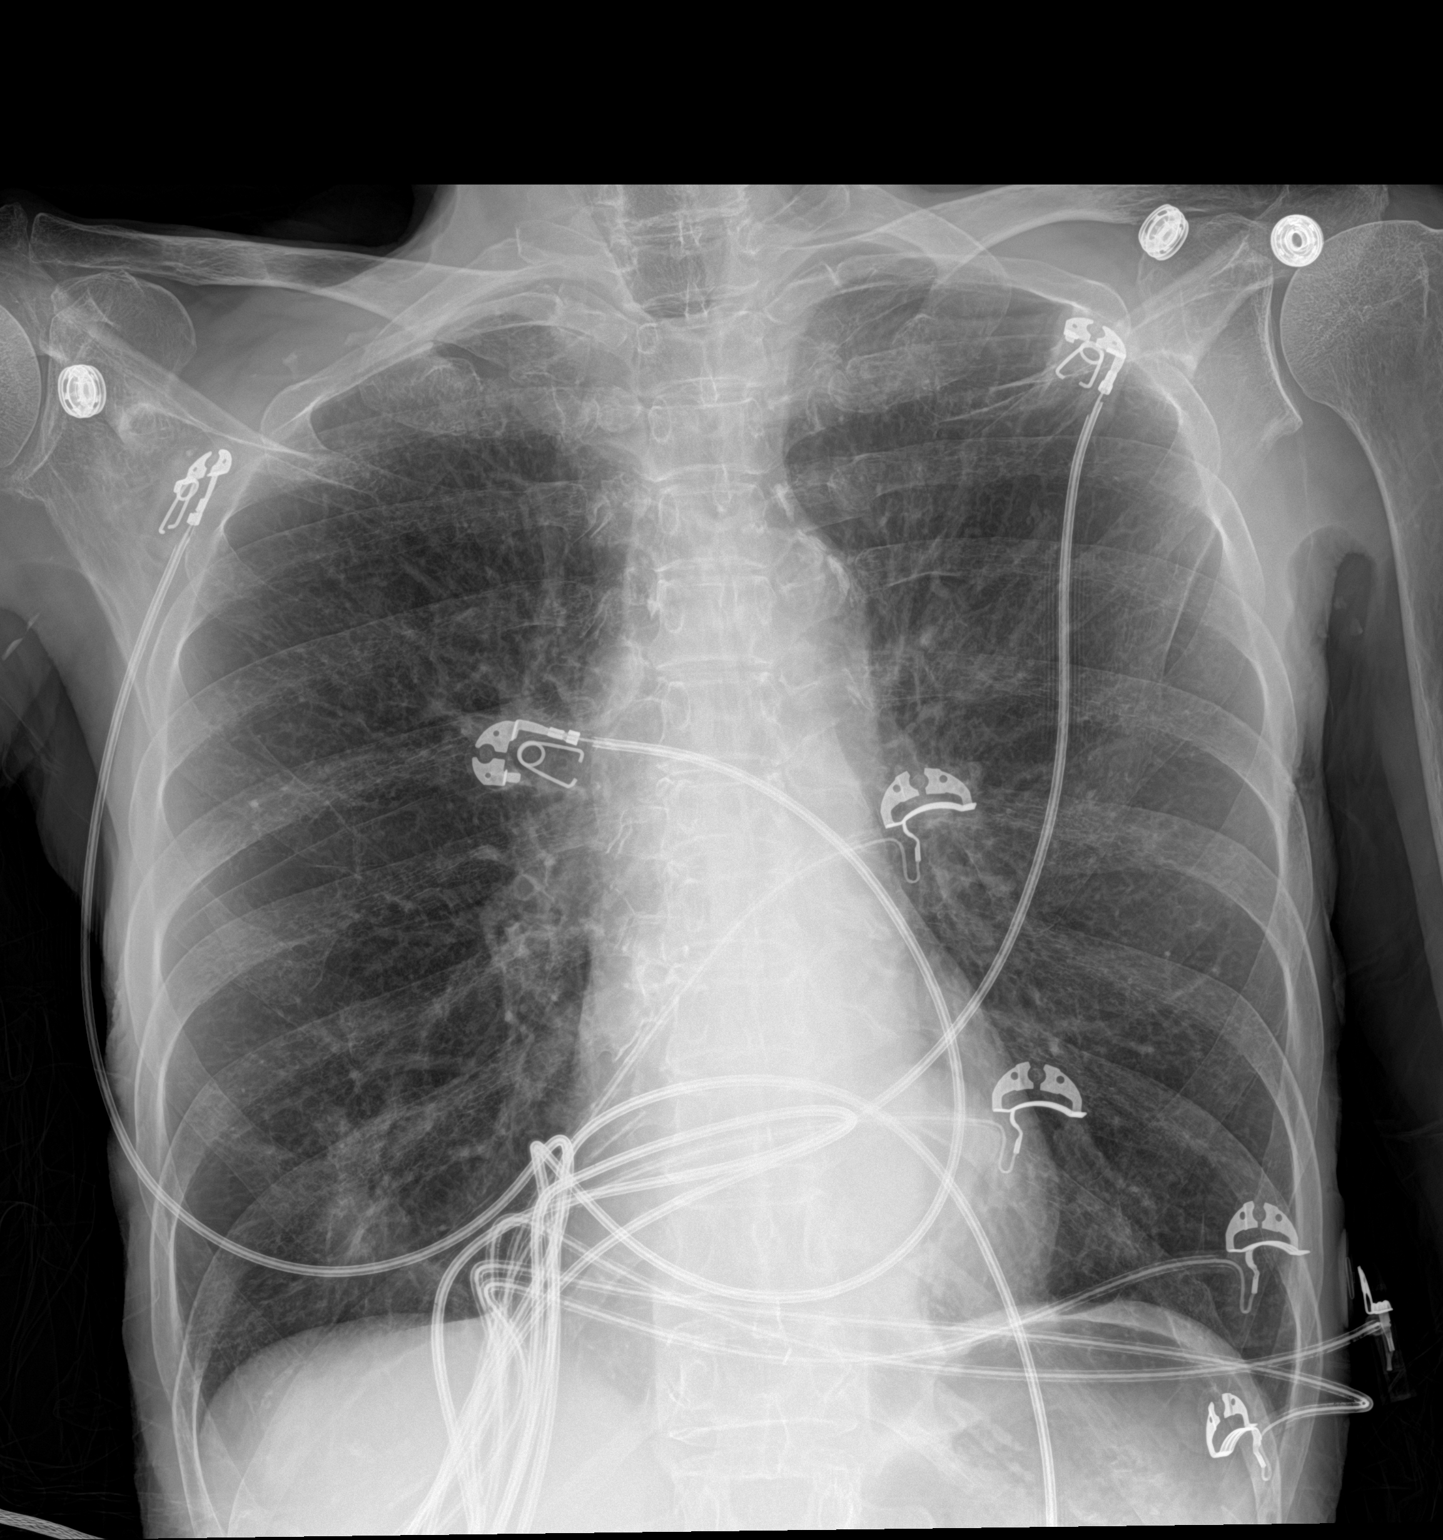

[1 of 1 positions shown; findings below may reference images not displayed]

FINDINGS: Patchy right lower lobe infiltrate. Partially obscured medial left
lung base mass. Hyperinflation with emphysematous disease. Stable
cardiomediastinal silhouette with aortic atherosclerosis. No
pneumothorax.
IMPRESSION: 1. No significant interval change in patchy right lung base
infiltrate.
2. Stable medial left lung base pulmonary mass.

## 2020-04-17 IMAGING — PT NM PET TUM IMG INITIAL (PI) SKULL BASE T - THIGH
1 of 8 series · 1 of 25 positions shown · non-contrast
Comparison: Multiple exams, including 09/18/2017

CLINICAL DATA: Initial treatment strategy for right lung mass.

EXAM:
NUCLEAR MEDICINE PET SKULL BASE TO THIGH
TECHNIQUE: 4.4 mCi F-18 FDG was injected intravenously. Full-ring PET imaging
was performed from the skull base to thigh after the radiotracer. CT
data was obtained and used for attenuation correction and anatomic
localization.
Fasting blood glucose: 73 mg/dl

[Series 3: pet sk_thigh ac · axial · 5.0mm · 4.07mm/px · 1 of 196 slices shown]
[im 131/196]
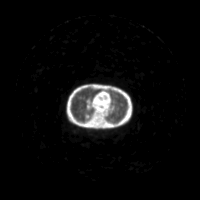

[1 of 25 positions shown; findings below may reference images not displayed]

FINDINGS: Mediastinal blood pool activity: SUV max

NECK: High muscular activity in the pterygoid muscles and diffusely
throughout the tongue without CT correlate, likely physiologic.
Potential symmetric accentuated activity in the tonsils. There is
misregistration between the CT and PET data in the head/neck region.

Incidental CT findings: Bilateral carotid atherosclerotic
calcification. Severe paucity of adipose tissue.

CHEST: [DATE] by 2.8 cm left lower lobe pleural-based mass
posteromedially has maximum SUV of 8.0 and central low activity
compatible with central necrosis. There is additional faint
nodularity in both lower lobes with low-grade associated metabolic
activity. For example, a 0.9 by 0.4 cm nodule in the right lower
lobe on image 37/8 has a maximum SUV of 0.8 and a subpleural band of
airspace opacity in the right lower lobe measuring 2.3 by 1.2 cm has
a maximum SUV of 1.4. There is additional scattered indistinctly
marginated lower lobe nodularity with small irregular nodules of
varying size favoring the lower lobes. There is also some
atelectasis or bandlike density below the level of the left lower
lobe mass.

Mild biapical pleuroparenchymal scarring.

Incidental CT findings: Lower lobe consolidation is generally
improved from 09/18/2017, and aside from the left lower lobe mass,
much of the nodularity and remaining opacity is probably
inflammatory based on morphology and appearance.

Coronary, aortic arch, and branch vessel atherosclerotic vascular
disease. Emphysema noted.

ABDOMEN/PELVIS: Left kidney lower pole ablation defect with
peripheral rim calcification but no current hypermetabolic activity.

Incidental CT findings: Aortoiliac atherosclerotic vascular disease.

SKELETON: No focal hypermetabolic activity to suggest skeletal
metastasis.

Incidental CT findings: Bilateral hip screws.
IMPRESSION: 1. 3.0 cm left lower lobe mass is hypermetabolic with maximum SUV of
8.0, compatible with malignancy. This mass has central necrosis.
2. Improved by lateral airspace opacities with bibasilar nodularity
and bandlike densities. These have relatively low-level metabolic
activity, maximum SUV up to 1.4, and are most likely inflammatory
given the improving response., thought to be physiologic. High
activity in the tongue and some neck musculature
3. Other imaging findings of potential clinical significance: Aortic
Atherosclerosis ([id number]-JN0.0) and Emphysema ([id number].E). Coronary
atherosclerosis. Stable appearance of left kidney lower pole
ablation defect with peripheral rim calcification but no current
hypermetabolic activity.

## 2020-04-20 IMAGING — DX DG CHEST 1V PORT
1 series · 1 of 1 positions shown · non-contrast
Comparison: 10/03/2017

CLINICAL DATA: Shortness of breath

EXAM:
PORTABLE CHEST 1 VIEW

[chest ap]
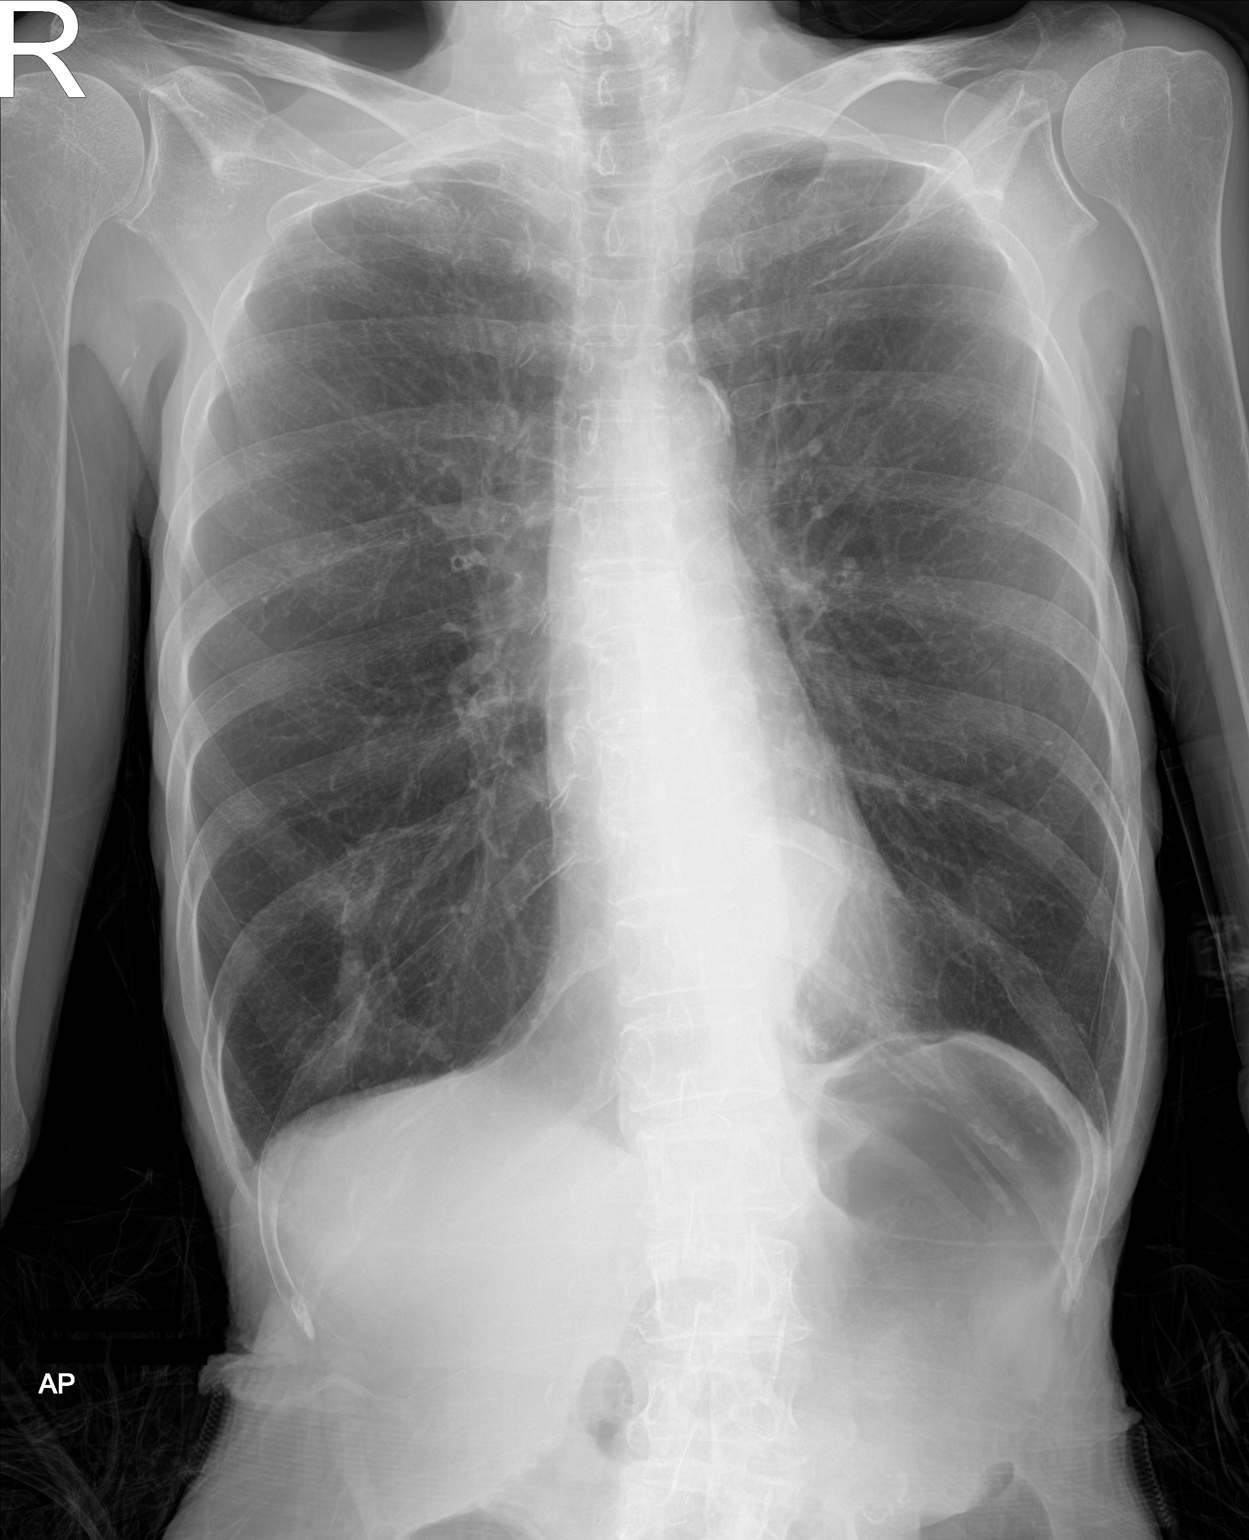

[1 of 1 positions shown; findings below may reference images not displayed]

FINDINGS: Cardiac shadow is within normal limits. Aortic calcifications are
noted. The lungs are hyperinflated consistent with COPD. Patient's
known left lower lobe mass lesion is again noted adjacent to the
spine in a retrocardiac position. The known nodular changes in the
bases are less well appreciated than on the prior exam. The band
like density in the right base is again noted and stable. No bony
abnormality is seen.
IMPRESSION: Stable left lower lobe mass lesion and right basilar scarring.

COPD.

## 2020-04-24 IMAGING — DX DG CHEST 1V PORT
1 series · 1 of 1 positions shown · non-contrast
Comparison: 10/06/2017

CLINICAL DATA: Post biopsy

EXAM:
PORTABLE CHEST 1 VIEW

[chest ap]
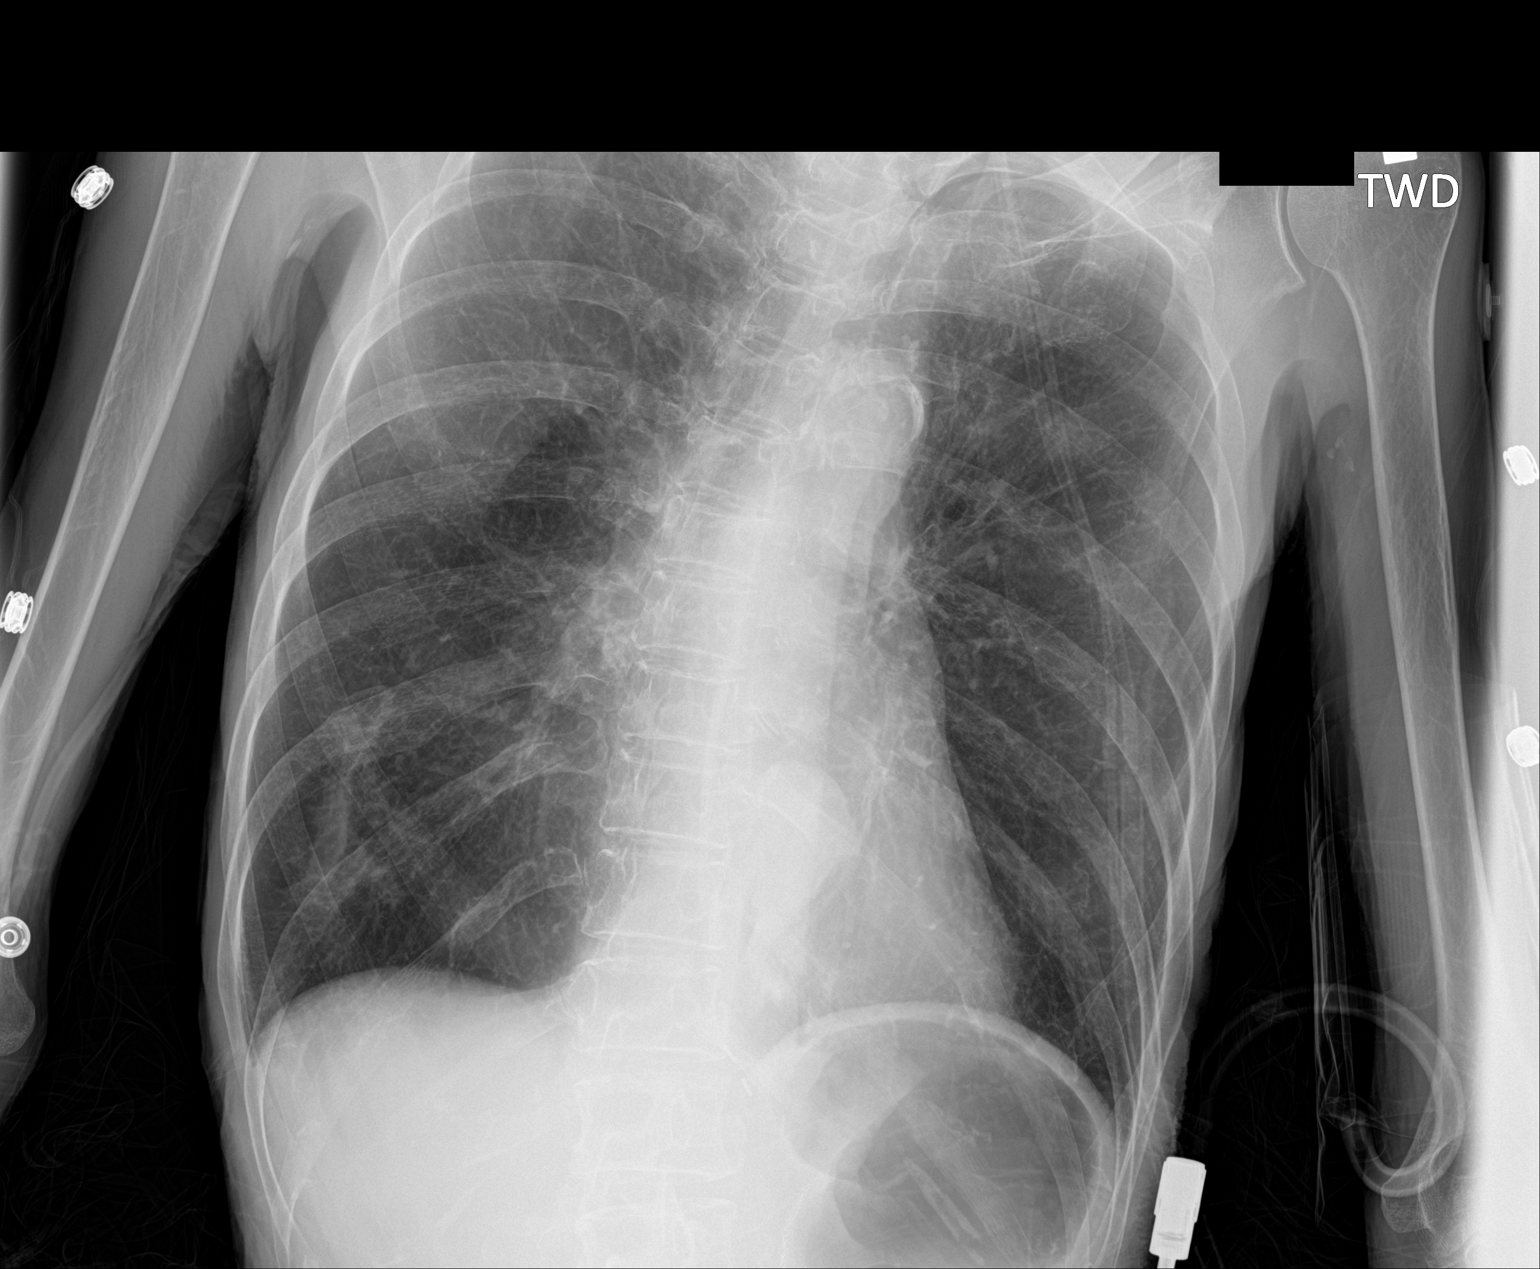

[1 of 1 positions shown; findings below may reference images not displayed]

FINDINGS: There is no pneumothorax status post left lung biopsy. Lungs remain
hyperaerated. Left lower lobe mass is noted. Heart is normal in
size.
IMPRESSION: No pneumothorax post left lung biopsy.

## 2020-04-24 IMAGING — US IR RADIOLOGY PERIPHERAL GUIDED IV START
1 series · 3 of 3 positions shown · non-contrast
Comparison: none

INDICATION: Poor IV access

[Series 1: ir (id) (id)/(id)/(id) · 3 of 3 slices shown]
[im 1/3]
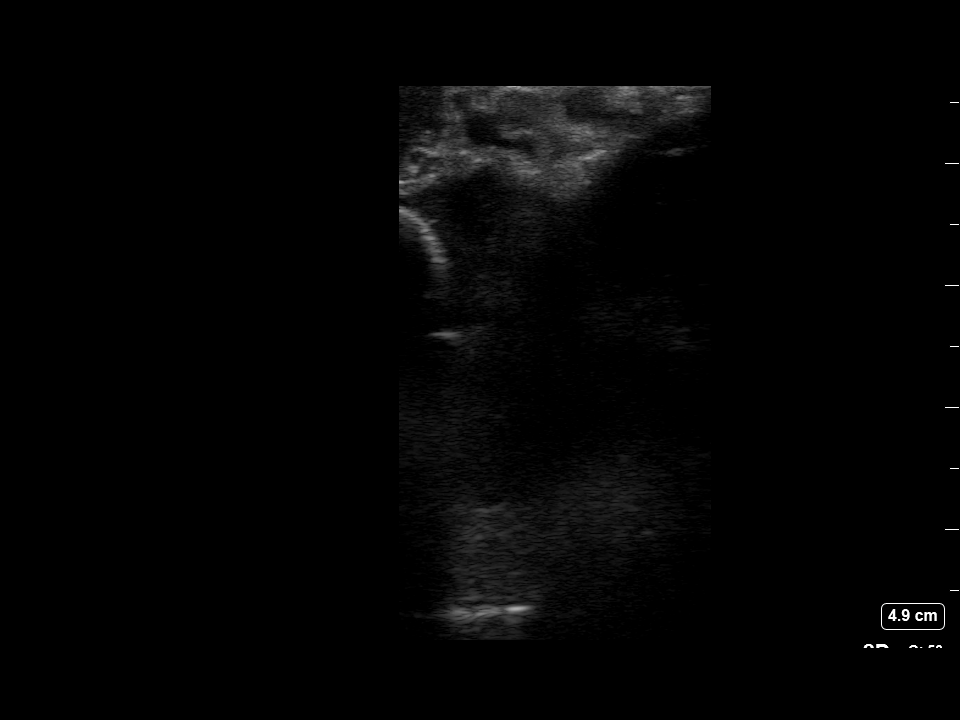
[im 2/3]
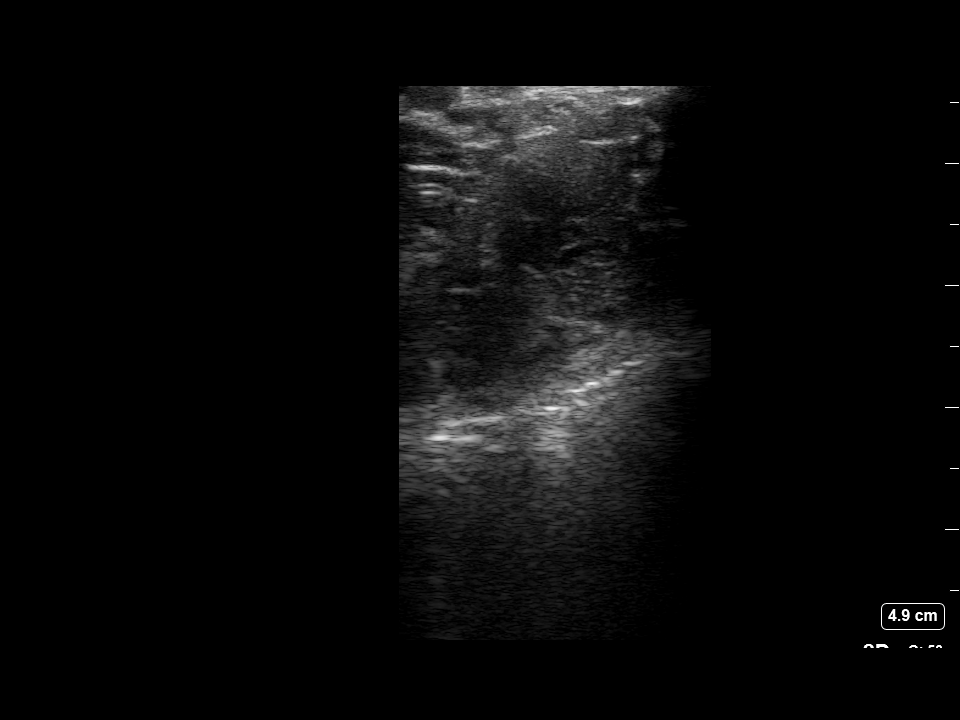
[im 3/3]
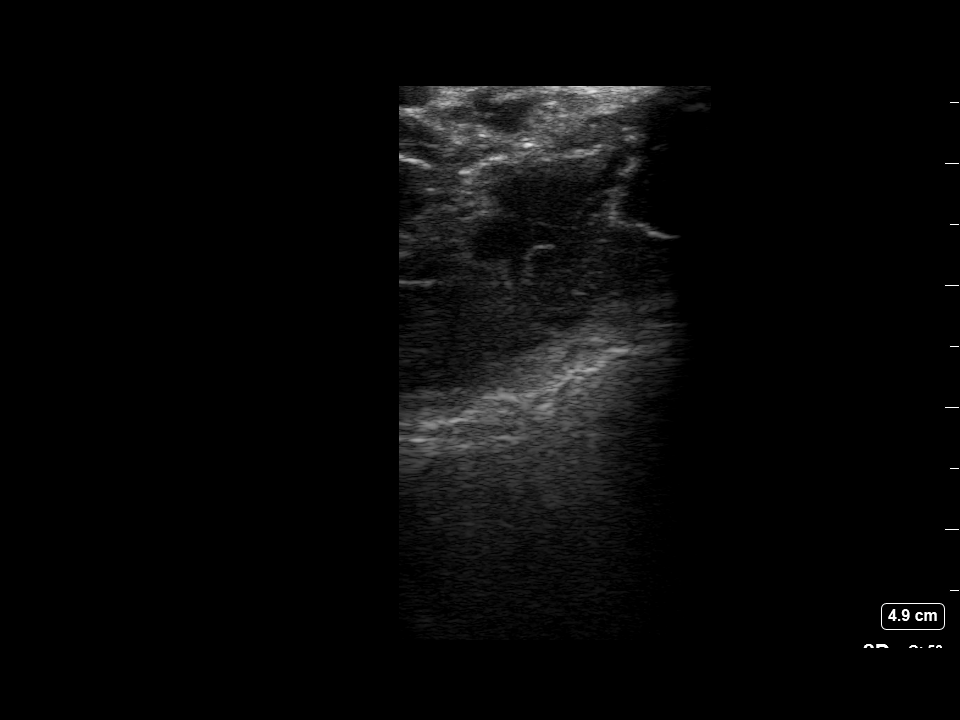

[3 of 3 positions shown; findings below may reference images not displayed]

EXAM:
VENA PUNCTURE BY TOMIKA

MEDICATIONS:
None

ANESTHESIA/SEDATION:
None

FLUOROSCOPY TIME:  None

COMPLICATIONS:
None immediate.

PROCEDURE:
Informed written consent was obtained from the patient after a
thorough discussion of the procedural risks, benefits and
alternatives. All questions were addressed. Maximal Sterile Barrier
Technique was utilized including caps, mask, sterile gowns, sterile
gloves, sterile drape, hand hygiene and skin antiseptic. A timeout
was performed prior to the initiation of the procedure.

The right arm was prepped and draped in a sterile fashion. 1%
lidocaine was utilized for local anesthesia. Under sonographic
guidance, a micropuncture needle was inserted into the right basilic
vein and removed over a 018 wire. The basilic vein was noted to be
patent by sonography and documentation by sonography was performed.
A 5 French transitional dilator was advanced over the wire. It was
flushed and secured in place.
IMPRESSION: Successful vena puncture by TOMIKA.

## 2020-04-24 IMAGING — CT CT BIOPSY
1 of 2 series · 14 of 32 positions shown, 19 images · non-contrast
Comparison: none

INDICATION: Left lower lobe lung mass

[Series 2: i-spiral 5.0 b40f · axial · 0.63mm/px · z∈[+1010,+1157]mm · 14 of 48 slices shown, 19 images]
[im 3/48  soft-tissue]
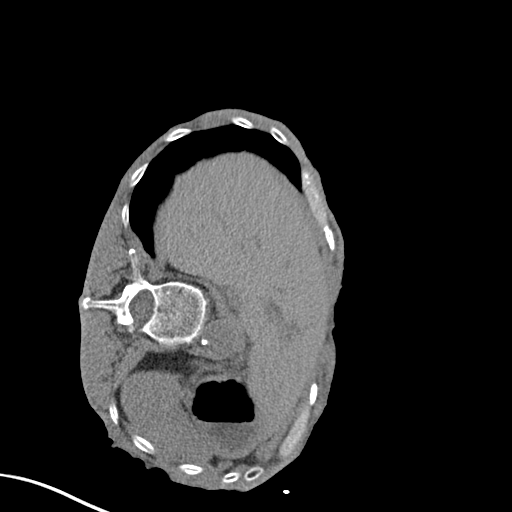
[im 3/48  bone]
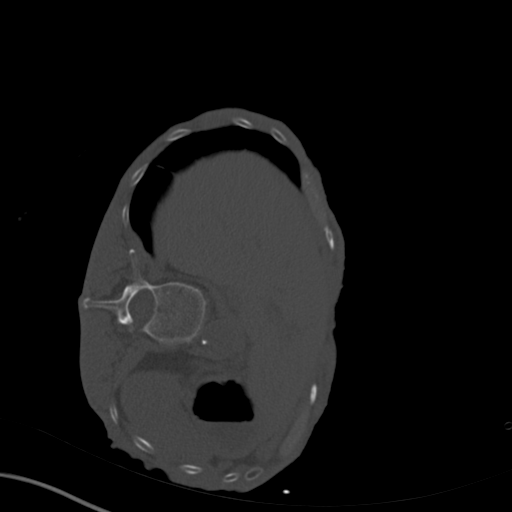
[im 6/48  soft-tissue]
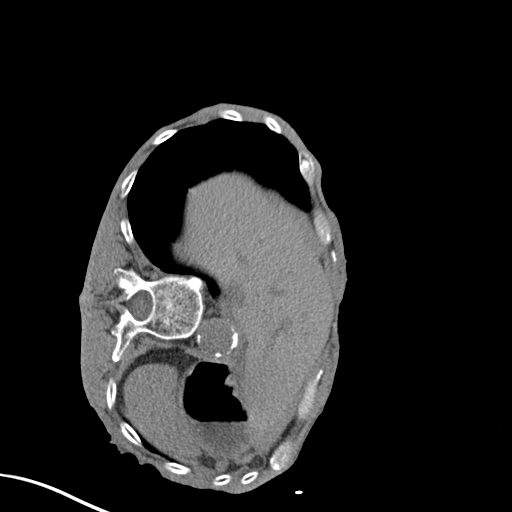
[im 12/48  soft-tissue]
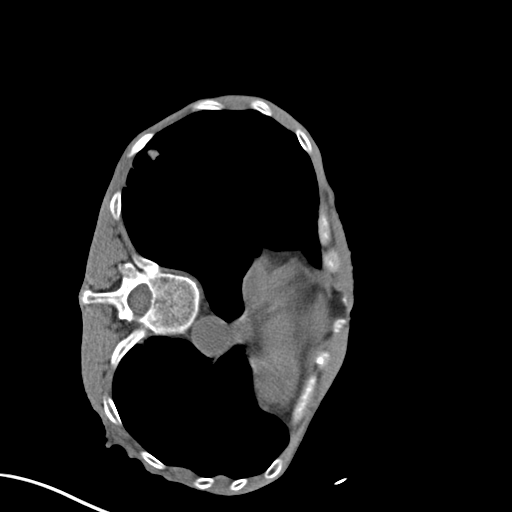
[im 14/48  soft-tissue]
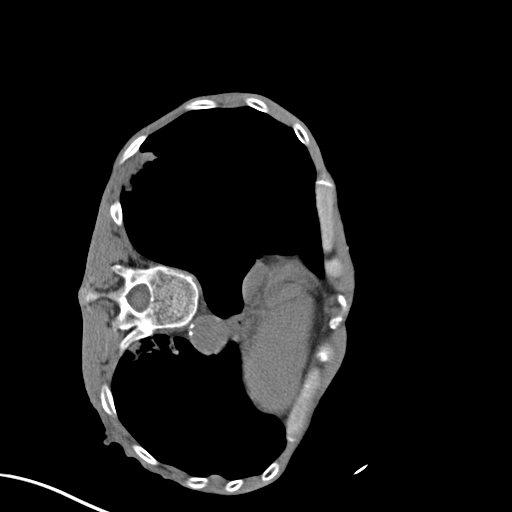
[im 17/48  soft-tissue]
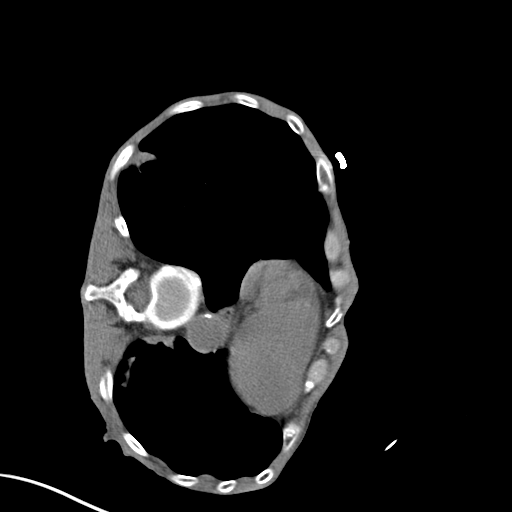
[im 20/48  soft-tissue]
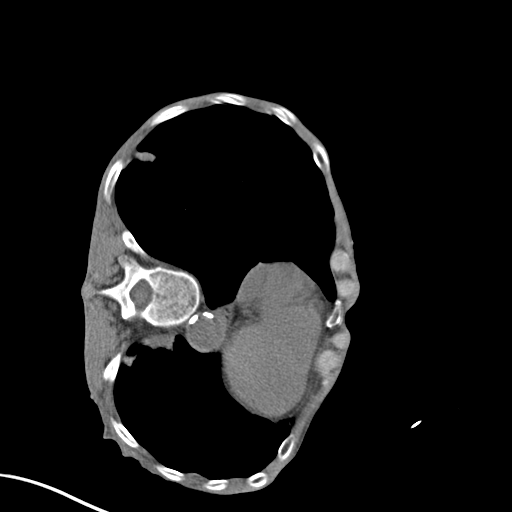
[im 25/48  soft-tissue]
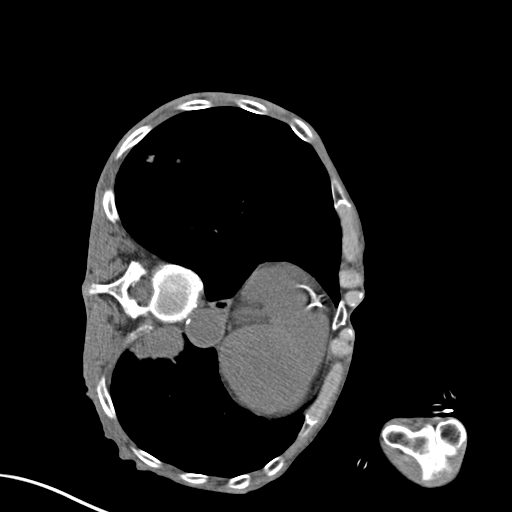
[im 28/48  soft-tissue]
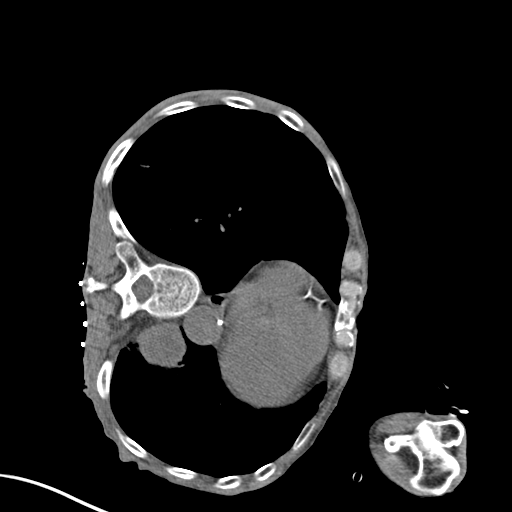
[im 31/48  soft-tissue]
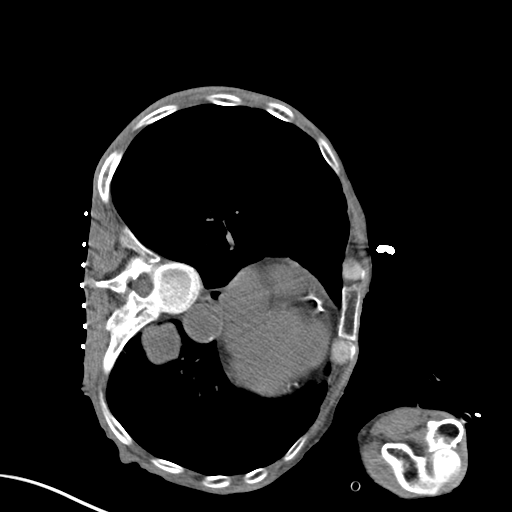
[im 31/48  bone]
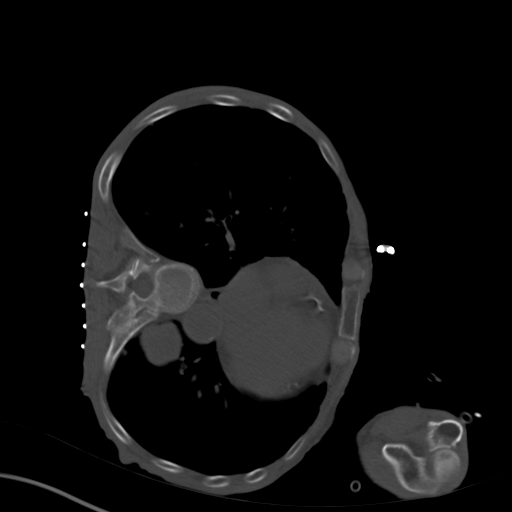
[im 34/48  soft-tissue]
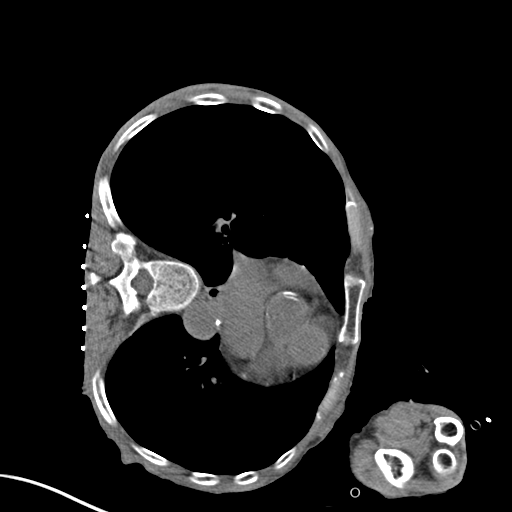
[im 36/48  soft-tissue]
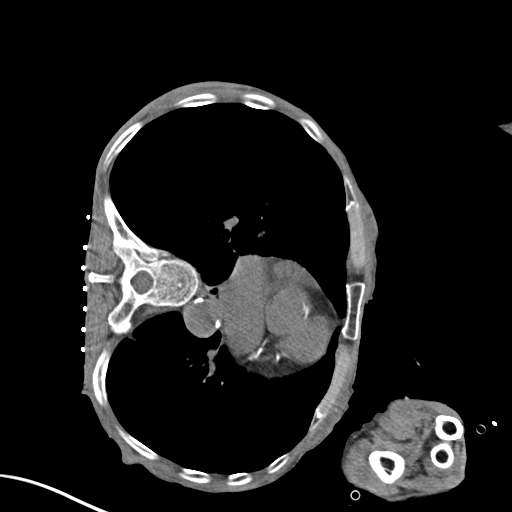
[im 36/48  lung]
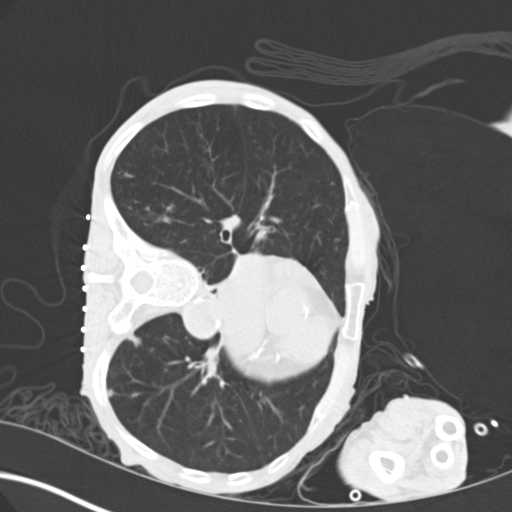
[im 39/48  lung]
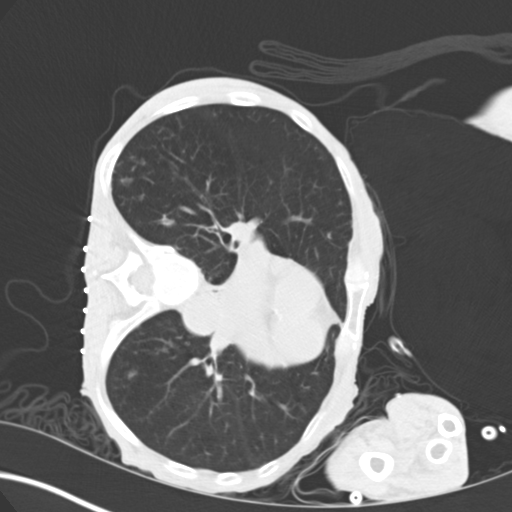
[im 42/48  soft-tissue]
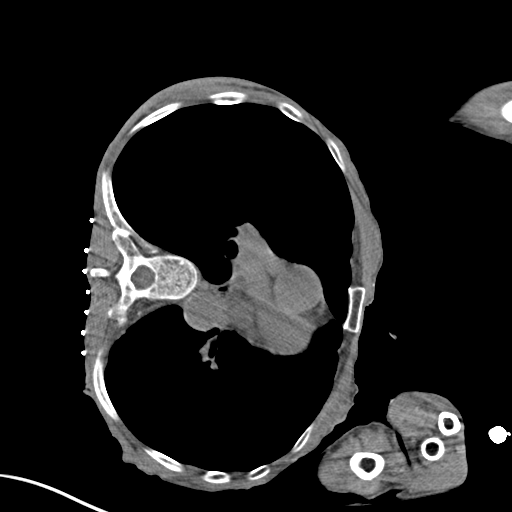
[im 42/48  lung]
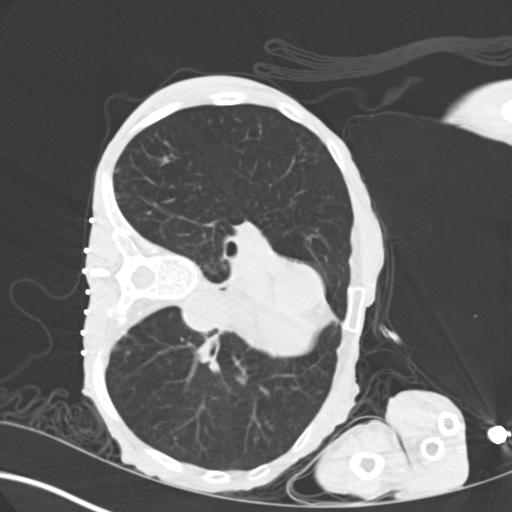
[im 45/48  soft-tissue]
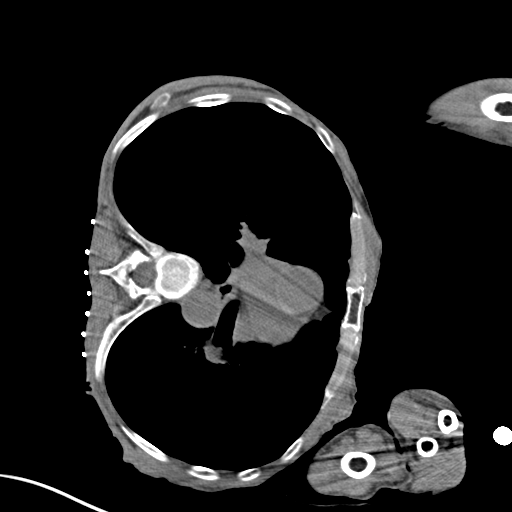
[im 45/48  lung]
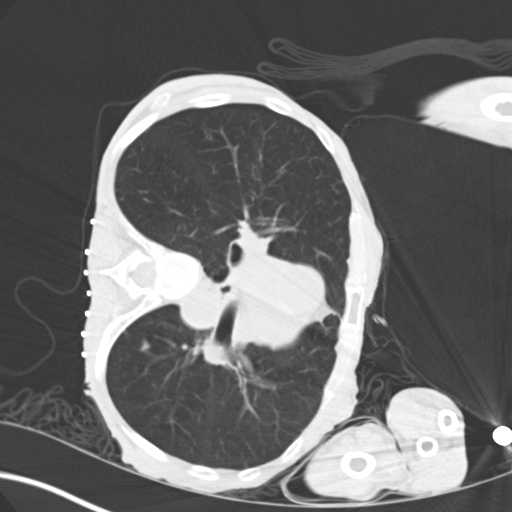

[14 of 32 positions shown; findings below may reference images not displayed]

EXAM:
CT BIOPSY

MEDICATIONS:
None.

ANESTHESIA/SEDATION:
Fentanyl 100 mcg IV; Versed 2 mg IV

Moderate Sedation Time:  12 minutes

The patient was continuously monitored during the procedure by the
interventional radiology nurse under my direct supervision.

FLUOROSCOPY TIME:  Fluoroscopy Time:  minutes  seconds ( mGy).

COMPLICATIONS:
None immediate.

PROCEDURE:
Informed written consent was obtained from the patient after a
thorough discussion of the procedural risks, benefits and
alternatives. All questions were addressed. Maximal Sterile Barrier
Technique was utilized including caps, mask, sterile gowns, sterile
gloves, sterile drape, hand hygiene and skin antiseptic. A timeout
was performed prior to the initiation of the procedure.

Under CT guidance, a(n) 17 gauge guide needle was advanced into the
left lower lobe lung mass. Subsequently 3 18 gauge core biopsies
were obtained. The guide needle was removed. Post biopsy images
demonstrate no hemorrhage or pneumothorax.

Patient tolerated the procedure well without complication. Vital
sign monitoring by nursing staff during the procedure will continue
as patient is in the special procedures unit for post procedure
observation.
FINDINGS: The images document guide needle placement within the left lower
lobe lung mass. Post biopsy images demonstrate no pneumothorax. A
tiny amount of gas in the inter costal space is noted.
IMPRESSION: Successful CT-guided left lower lobe lung mass core biopsy.

## 2020-05-07 IMAGING — CT CT ANGIO CHEST
2 of 6 series · 18 of 46 positions shown · IV contrast (ISOVUE)
Comparison: CT scan of September 18, 2017.

CLINICAL DATA: Chest pain, shortness of breath.

EXAM:
CT ANGIOGRAPHY CHEST WITH CONTRAST
TECHNIQUE: Multidetector CT imaging of the chest was performed using the
standard protocol during bolus administration of intravenous
contrast. Multiplanar CT image reconstructions and MIPs were
obtained to evaluate the vascular anatomy.
CONTRAST:  70mL MO2U2H-49N IOPAMIDOL (MO2U2H-49N) INJECTION 76%

[Series 5: thins · axial · 0.61mm/px · z∈[-315,+0]mm · 15 of 345 slices shown]
[im 15/345  lung]
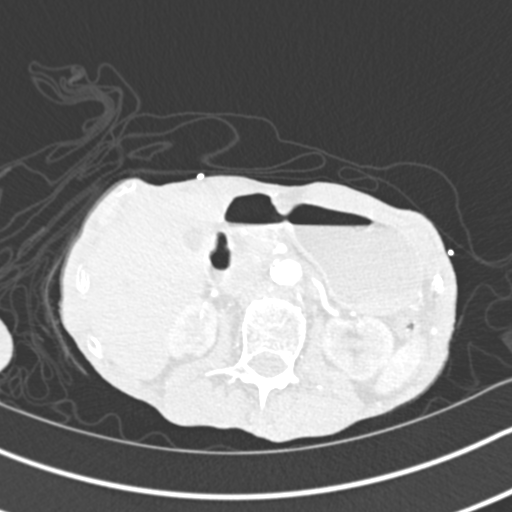
[im 45/345  soft-tissue]
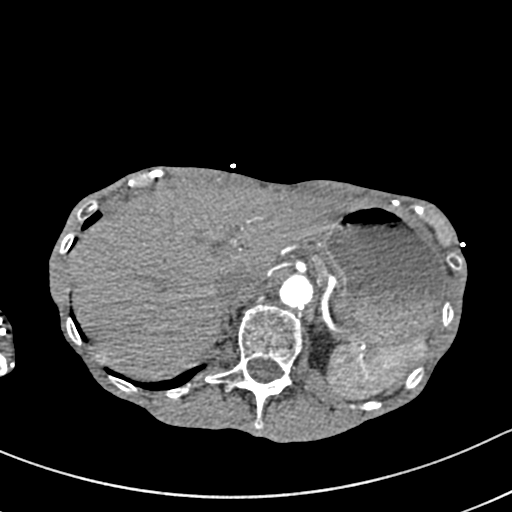
[im 60/345  lung]
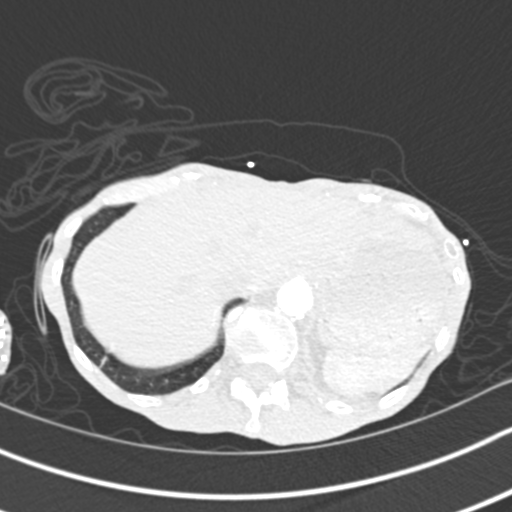
[im 90/345  soft-tissue]
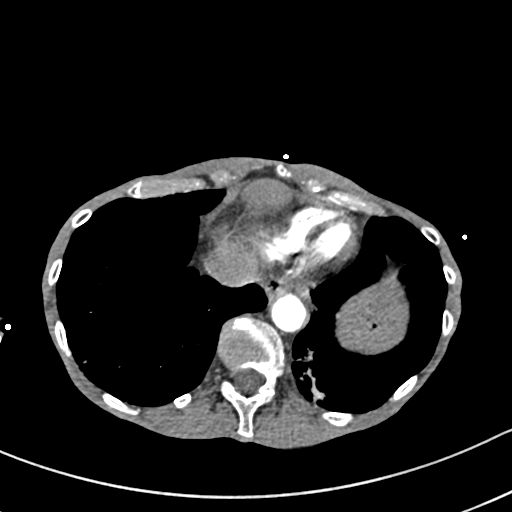
[im 105/345  lung]
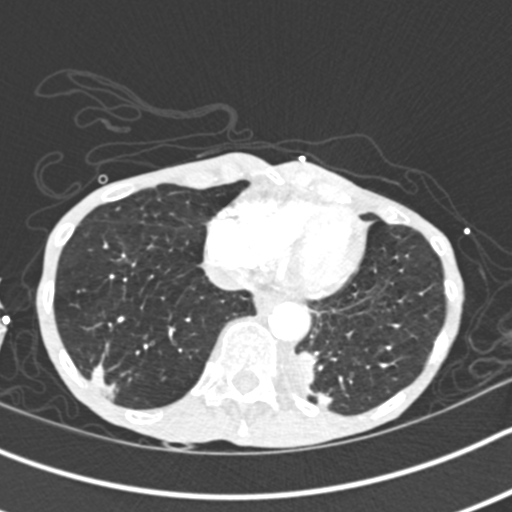
[im 135/345  soft-tissue]
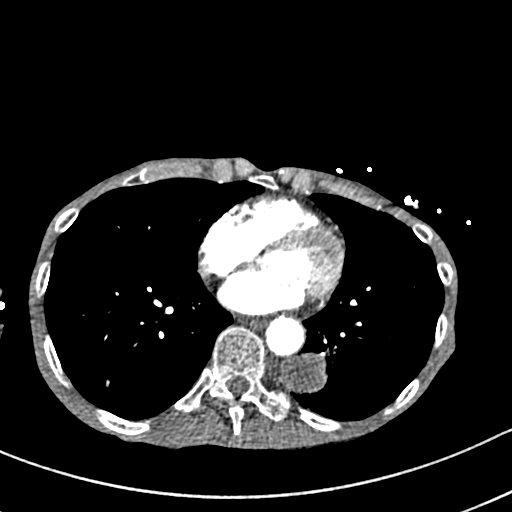
[im 150/345  lung]
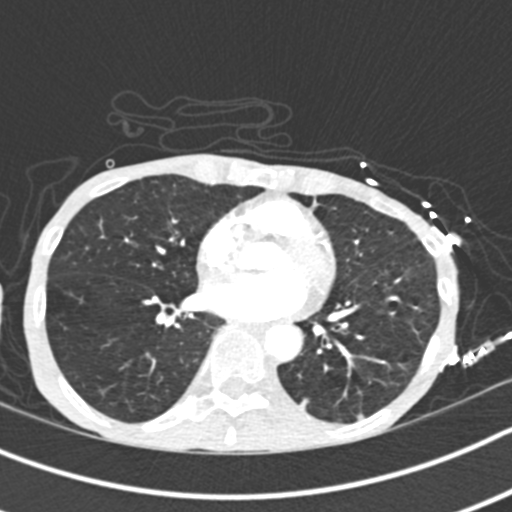
[im 180/345  soft-tissue]
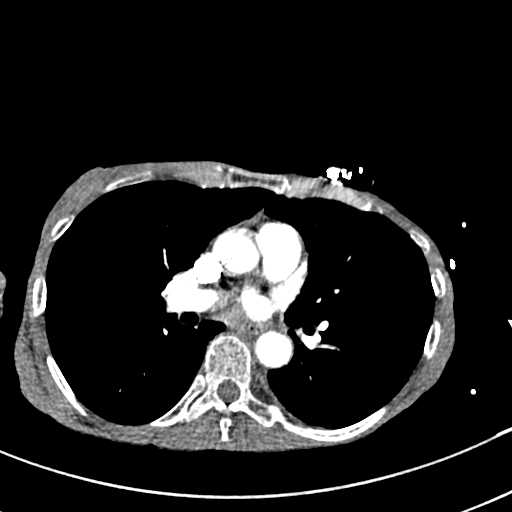
[im 195/345  lung]
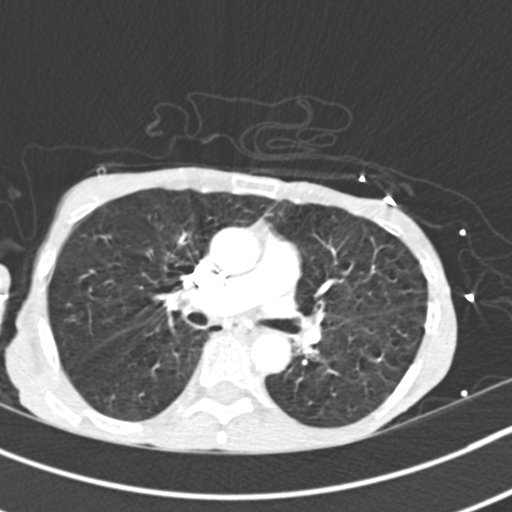
[im 210/345  soft-tissue]
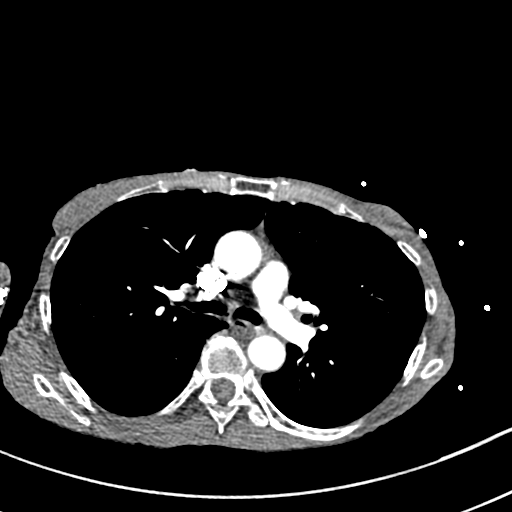
[im 240/345  lung]
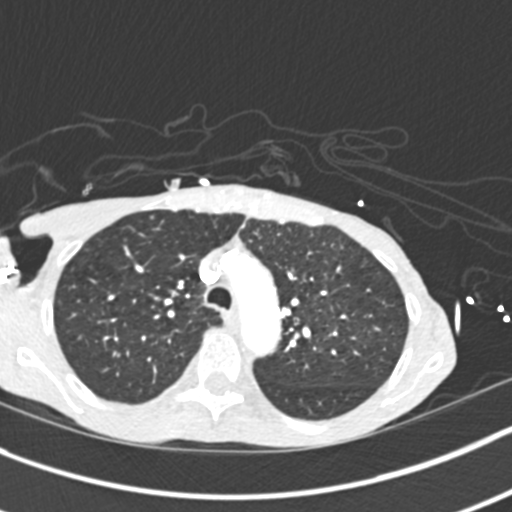
[im 255/345  soft-tissue]
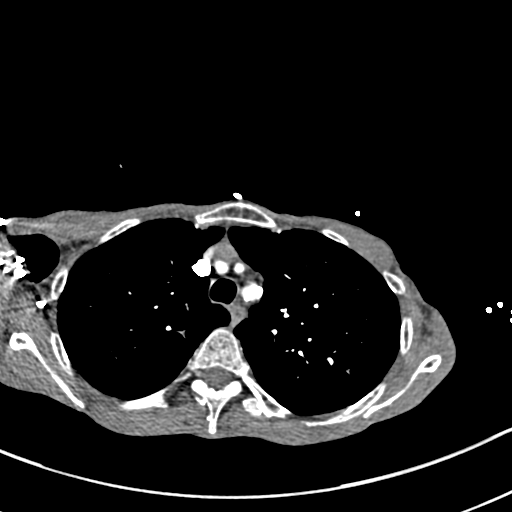
[im 285/345  lung]
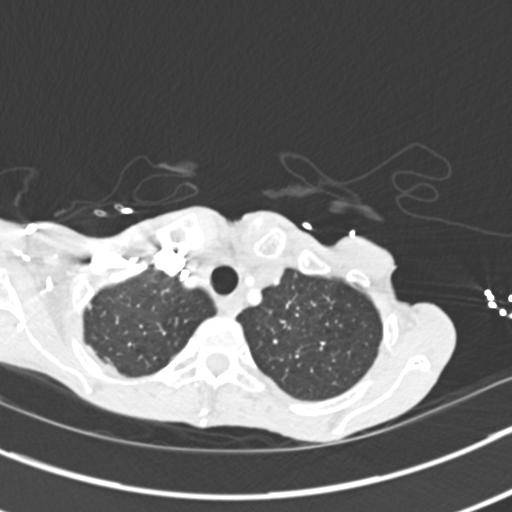
[im 300/345  soft-tissue]
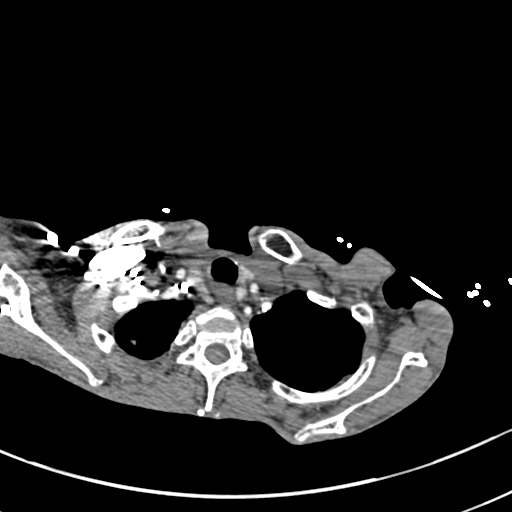
[im 330/345  lung]
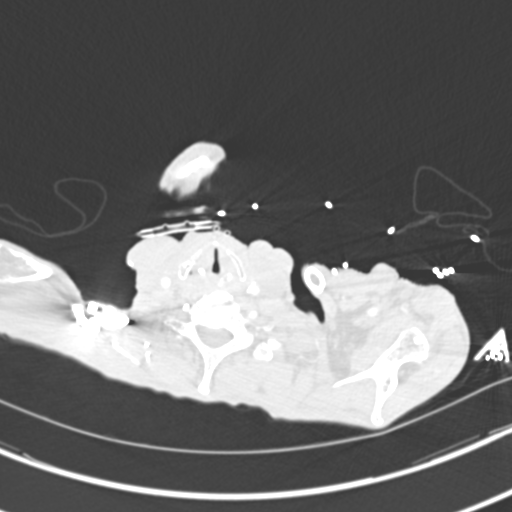

[Series 6: coronal mpr · coronal · 0.56mm/px · 3 of 87 slices shown]
[im 22/87  soft-tissue]
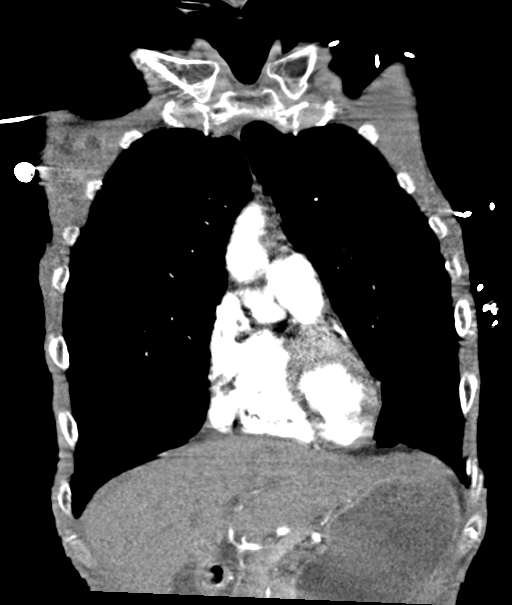
[im 44/87  soft-tissue]
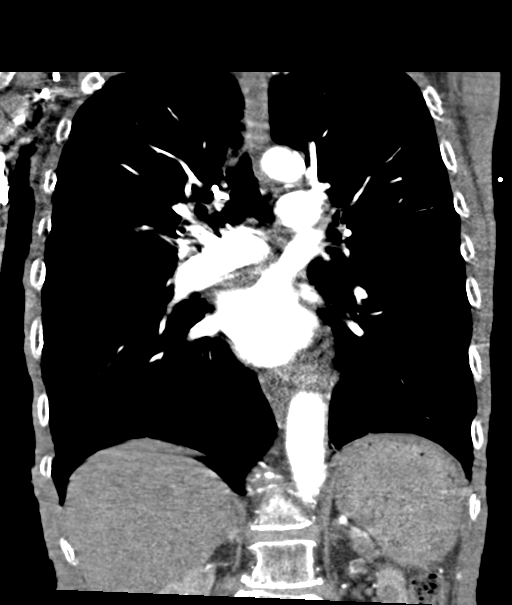
[im 65/87  soft-tissue]
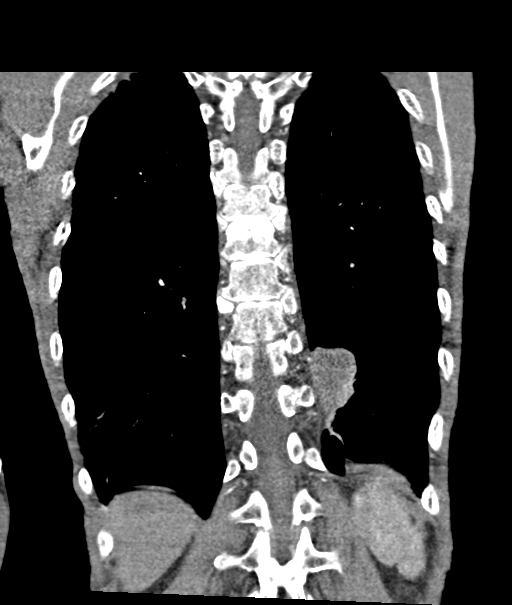

[18 of 46 positions shown; findings below may reference images not displayed]

FINDINGS: Cardiovascular: Satisfactory opacification of the pulmonary arteries
to the segmental level. No evidence of pulmonary embolism. Normal
heart size. No pericardial effusion. Atherosclerosis of thoracic
aorta is noted without aneurysm or dissection.

Mediastinum/Nodes: No enlarged mediastinal, hilar, or axillary lymph
nodes. Thyroid gland, trachea, and esophagus demonstrate no
significant findings.

Lungs/Pleura: No pneumothorax or pleural effusion is noted. 3.2 cm
left lower lobe mass is noted medially which was recently biopsied.
Irregular density is noted posteriorly in the right lower lobe which
may represent postinfectious scarring.

Upper Abdomen: No acute abnormality.

Musculoskeletal: No chest wall abnormality. No acute or significant
osseous findings.

Review of the MIP images confirms the above findings.
IMPRESSION: No definite evidence of pulmonary embolus.

3.2 cm left lower lobe mass is noted which was recently biopsied.

Irregular density is noted posteriorly in the right lower lobe which
may represent postinfectious scarring as pneumonia was noted here
previously.

Aortic Atherosclerosis ([id number]).

## 2020-05-07 IMAGING — CR DG CHEST 2V
2 series · 2 of 2 positions shown · non-contrast
Comparison: 10/10/2017

CLINICAL DATA: Anxiety.  Lung mass.

EXAM:
CHEST - 2 VIEW

[w chest lat]
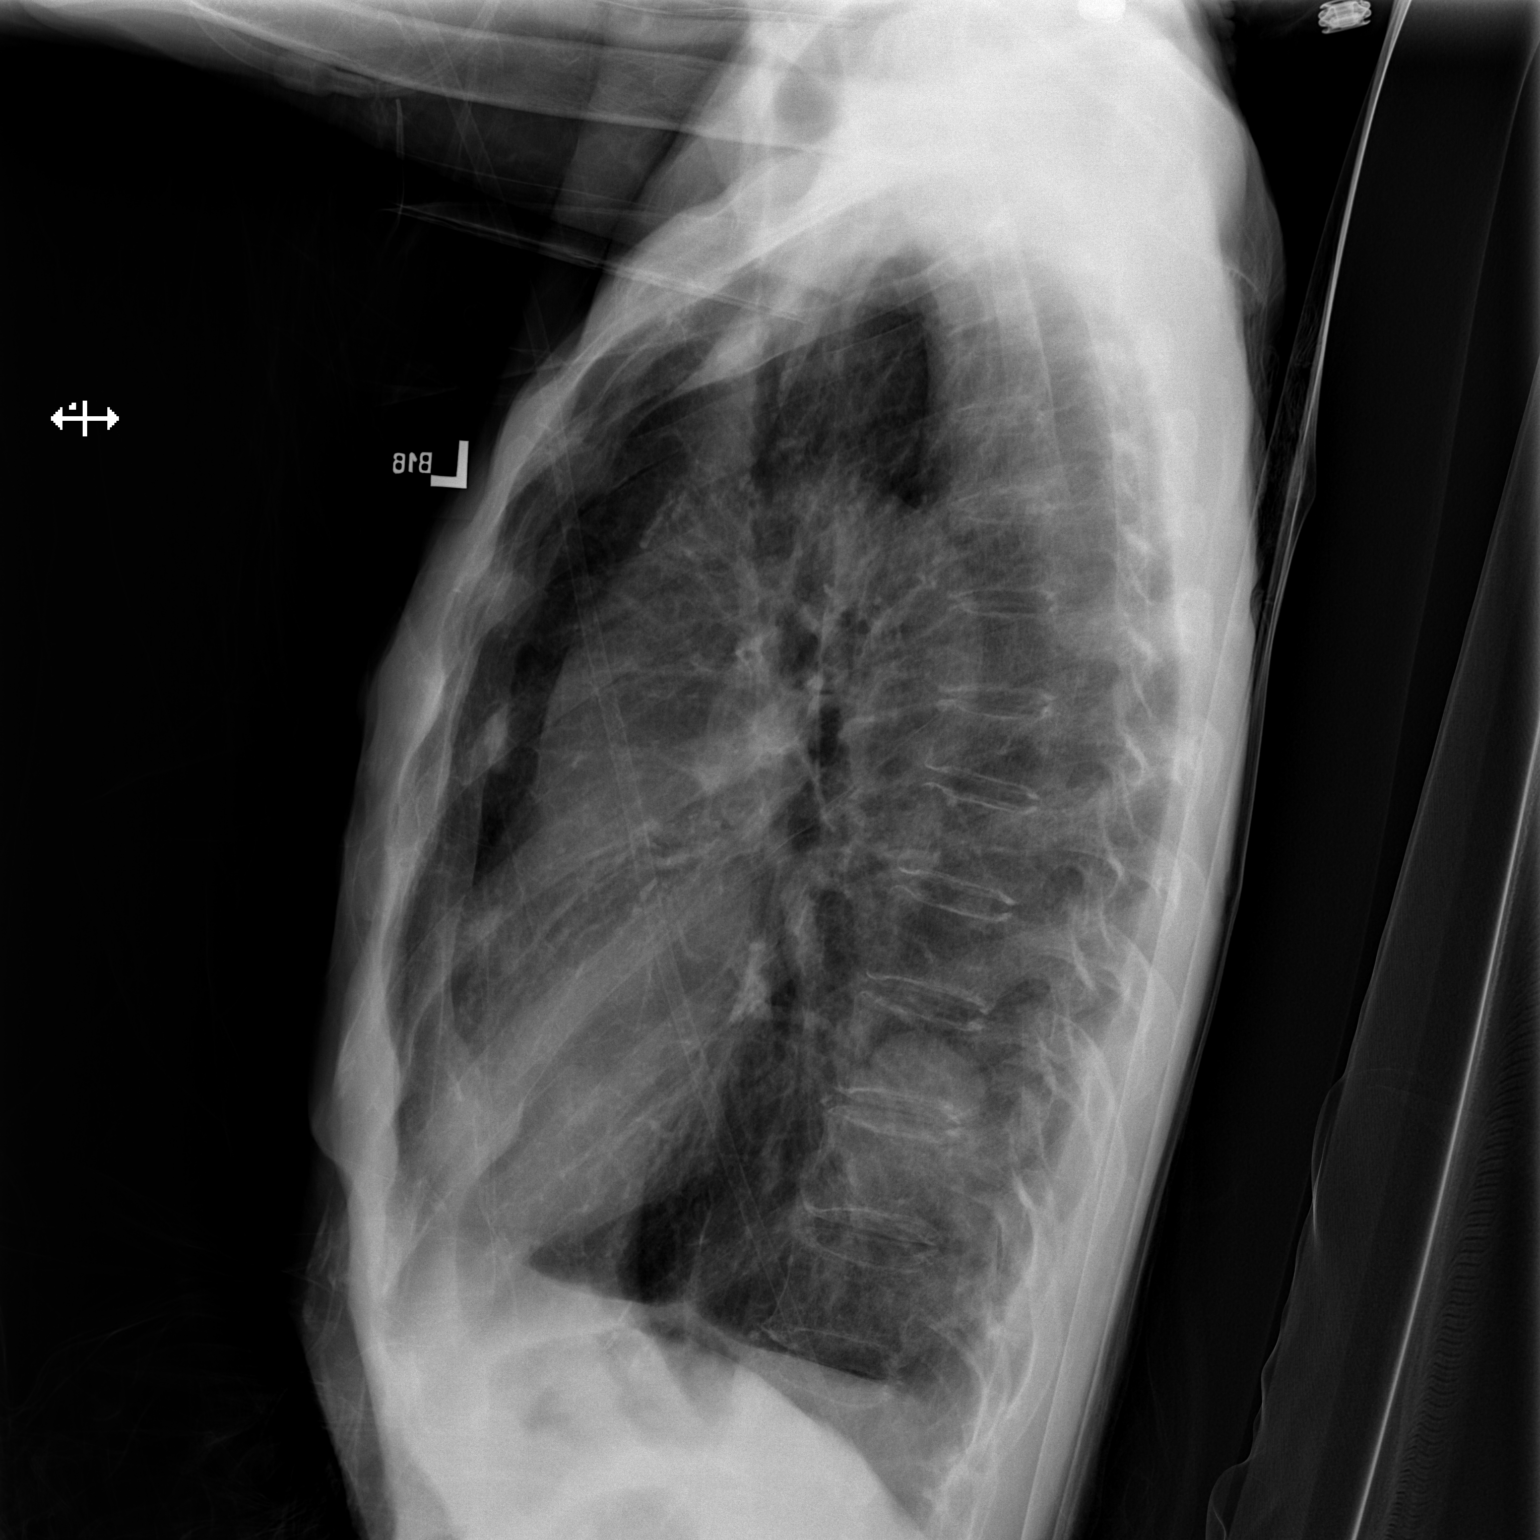

[x chest ap]
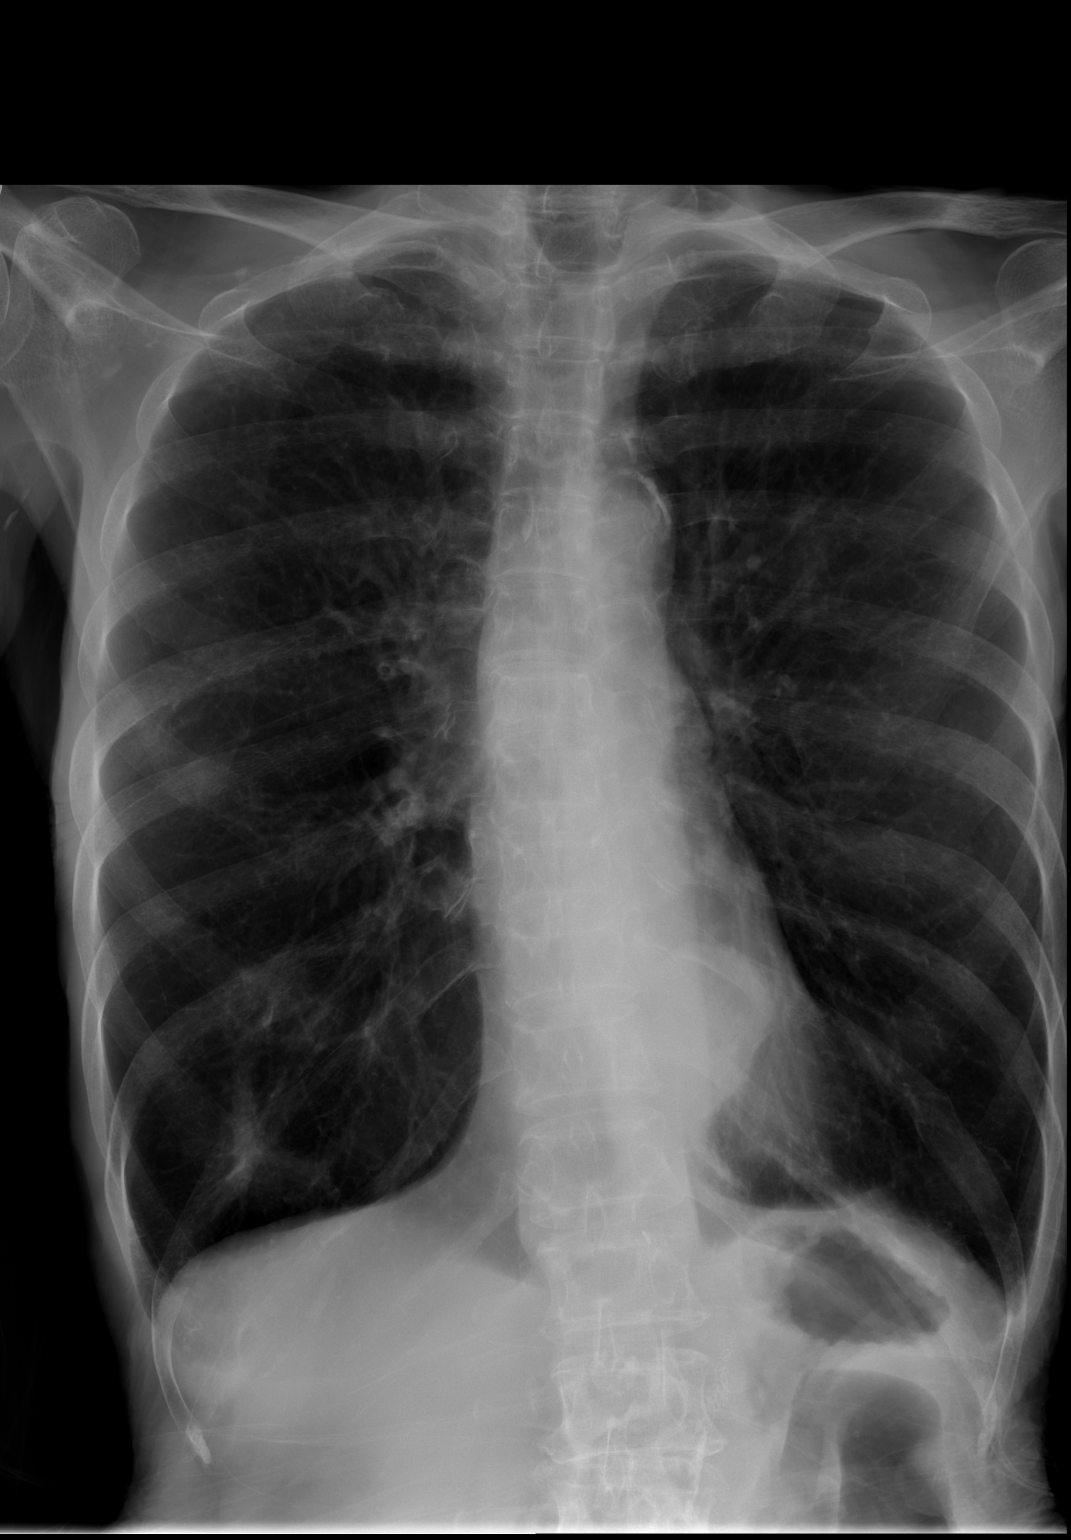

[2 of 2 positions shown; findings below may reference images not displayed]

FINDINGS: Cardiomediastinal silhouette is normal. Mediastinal contours appear
intact. Calcific atherosclerotic disease of the aorta.

There is no evidence of pneumothorax. Hyperinflation and upper lobe
predominant emphysema. Known left lower lobe lung mass projects over
the cardiac silhouette. Group pulmonary nodules in the right lower
lobe also evident.

Osseous structures are without acute abnormality. Soft tissues are
grossly normal.
IMPRESSION: Hyperinflation and upper lobe predominant emphysema.

Known left lower lobe lung mass and right lower lobe cluster of
pulmonary nodules are again seen.

No evidence of pneumothorax.

Calcific atherosclerotic disease of the aorta.

## 2020-05-29 IMAGING — CR DG CHEST 2V
2 series · 2 of 2 positions shown · non-contrast
Comparison: Radiographs and CT scan October 23, 2017.

CLINICAL DATA: Shortness of breath.

EXAM:
CHEST - 2 VIEW

[w chest lat]
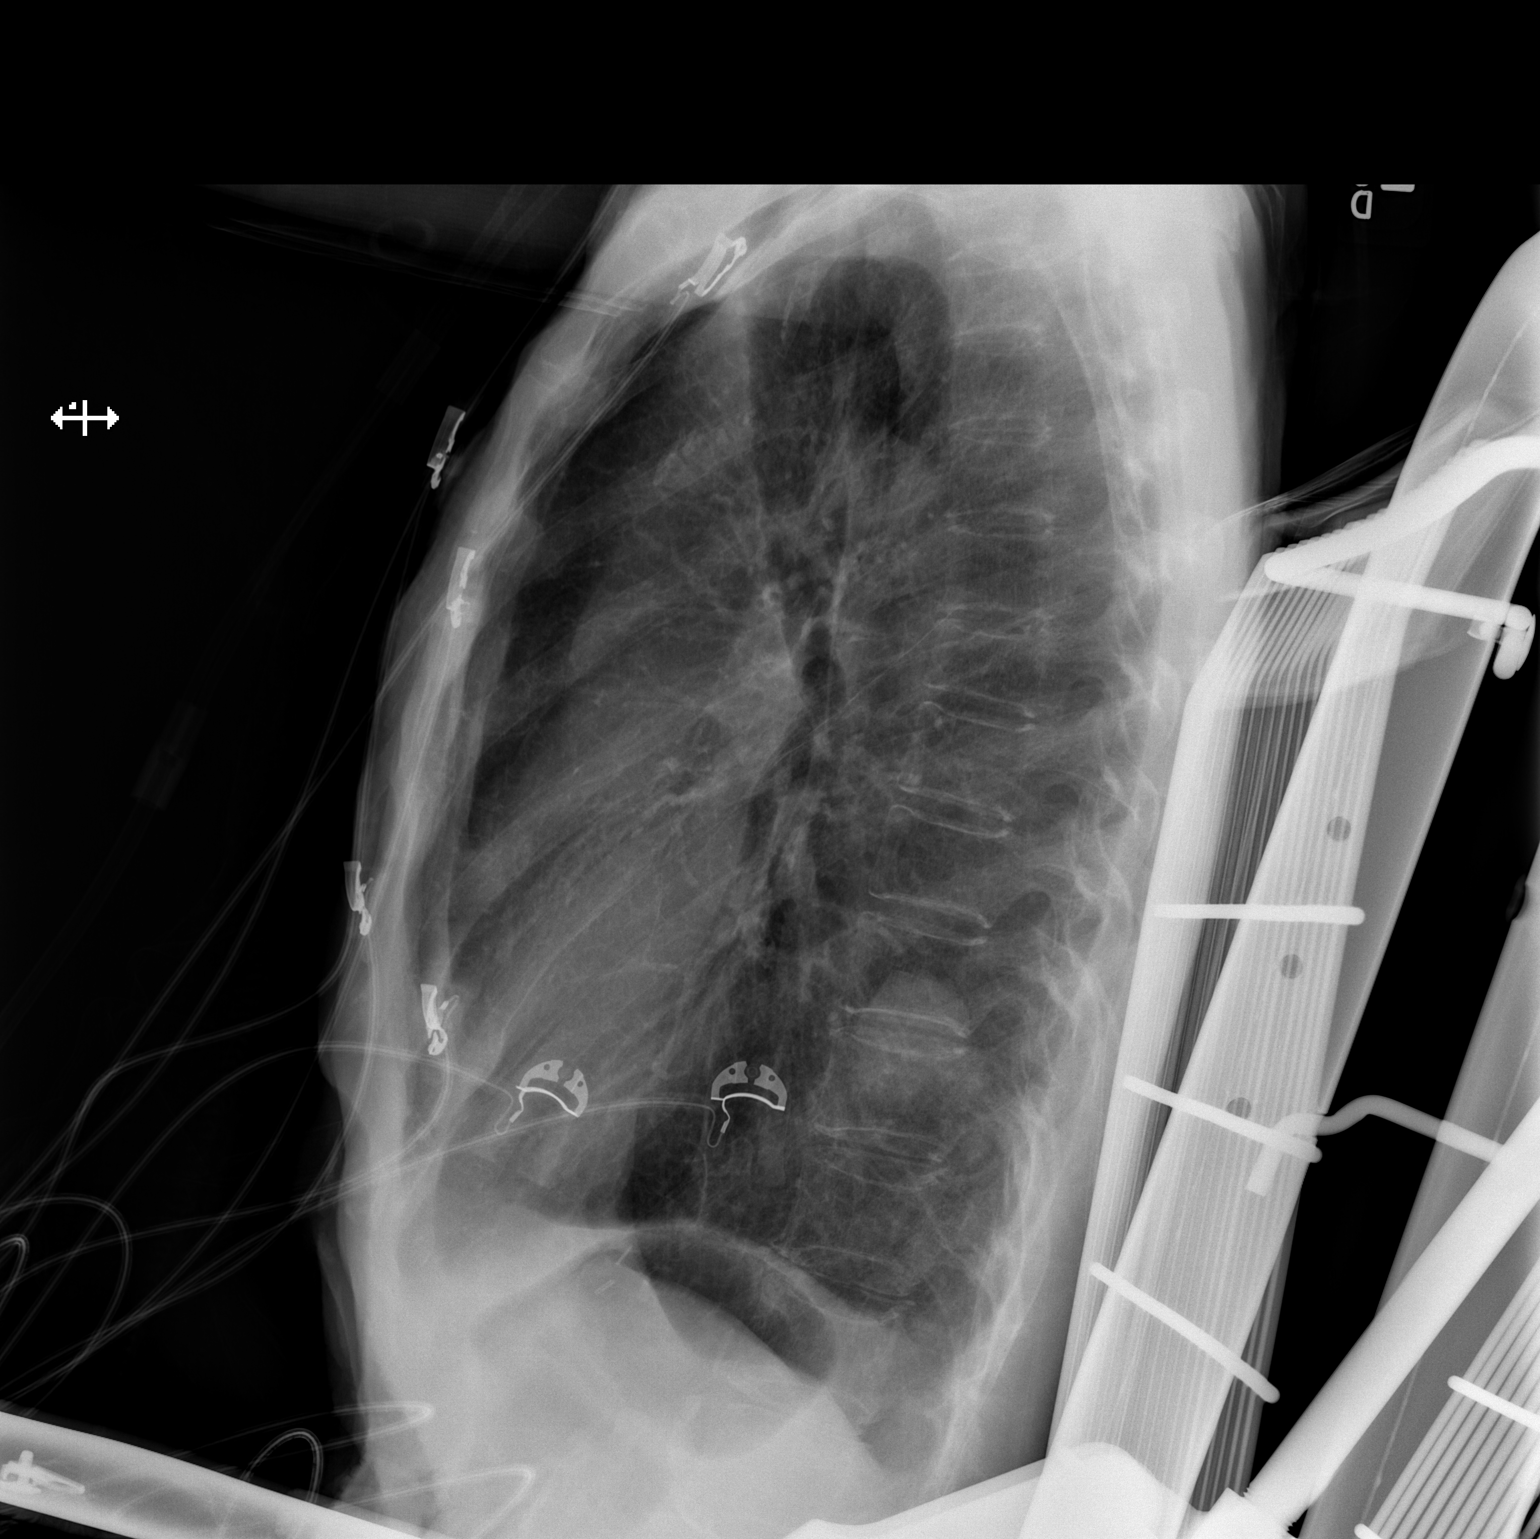

[x chest ap]
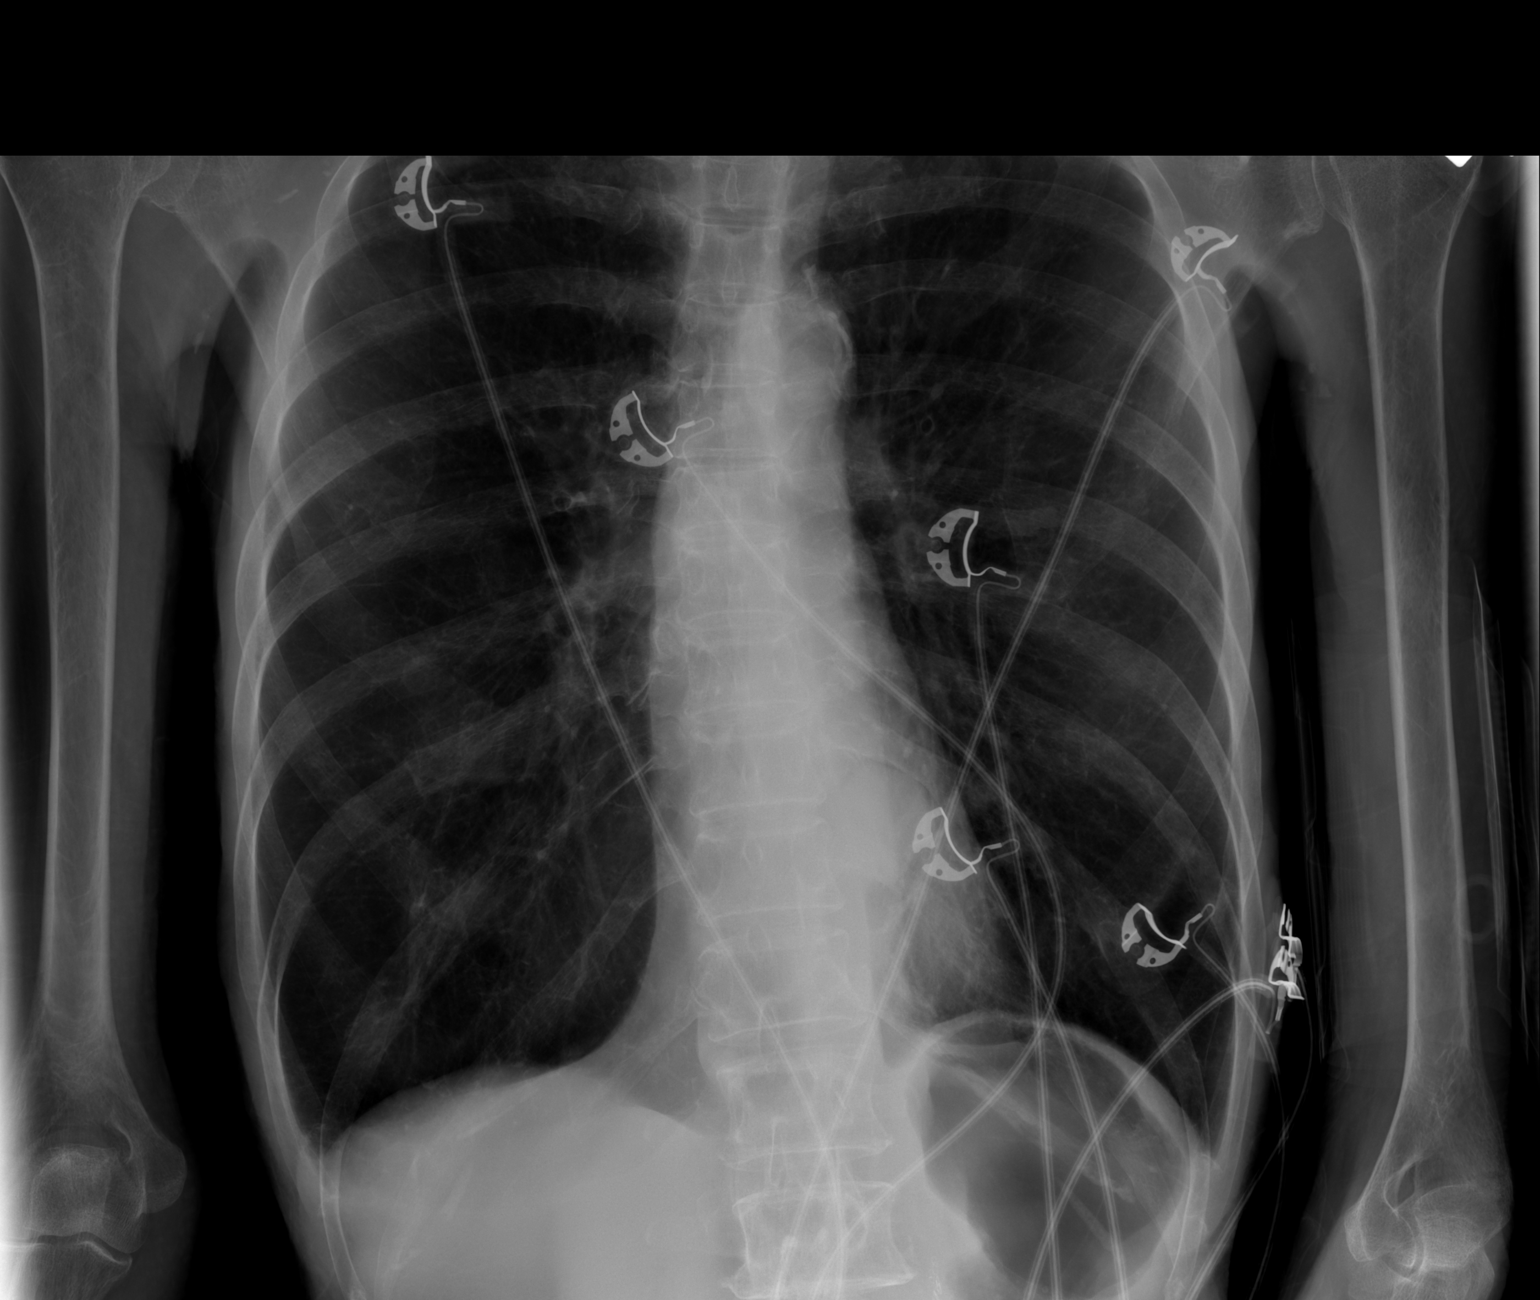

[2 of 2 positions shown; findings below may reference images not displayed]

FINDINGS: The heart size and mediastinal contours are within normal limits.
Hyperinflation of the lungs is noted. No pneumothorax or pleural
effusion is noted. Right lung is clear. Stable appearance of rounded
mass seen medially in left lower lobe as described on prior
radiograph and CT scan.. The visualized skeletal structures are
unremarkable.
IMPRESSION: Stable left lower lobe mass is noted concerning for neoplasm.
Hyperinflation of the lungs. No other abnormality seen in the chest.

## 2020-06-05 IMAGING — CT CT CERVICAL SPINE W/O CM
4 of 7 series · 14 of 33 positions shown, 15 images · non-contrast
Comparison: 10/03/2017 PET CT

CLINICAL DATA: 66-year-old female with LEFT arm and LEFT leg
weakness for 4 days. History of lung cancer.

EXAM:
CT HEAD WITHOUT CONTRAST
CT CERVICAL SPINE WITHOUT CONTRAST
TECHNIQUE: Multidetector CT imaging of the head and cervical spine was
performed following the standard protocol without intravenous
contrast. Multiplanar CT image reconstructions of the cervical spine
were also generated.

[Series 7: c spine soft · axial · 0.30mm/px · z∈[-220,-118]mm · 4 of 87 slices shown]
[im 18/87  soft-tissue]
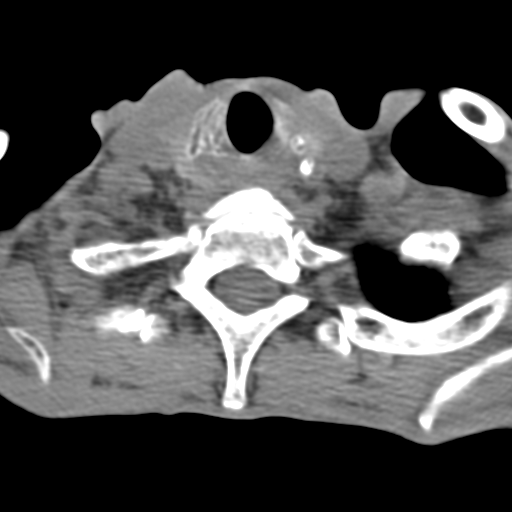
[im 35/87  soft-tissue]
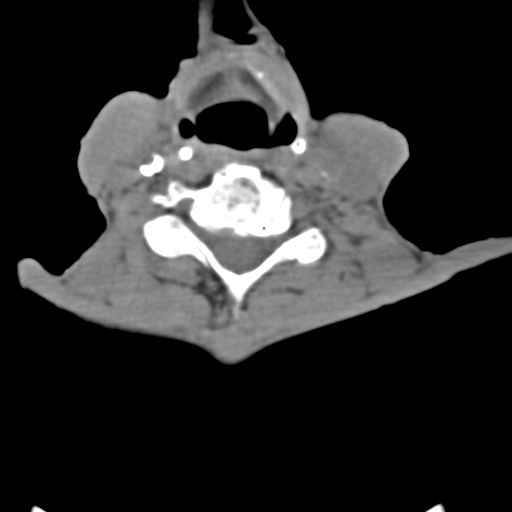
[im 52/87  soft-tissue]
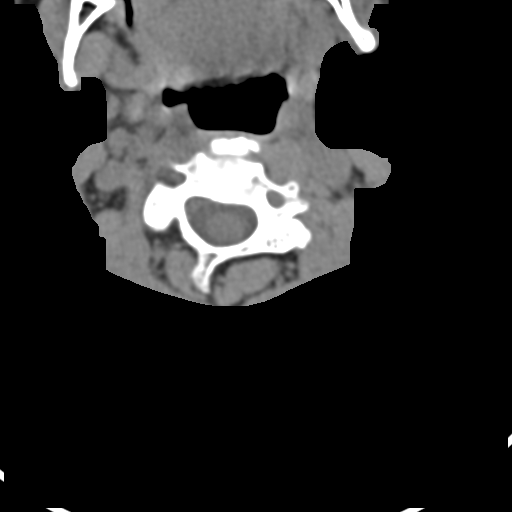
[im 69/87  soft-tissue]
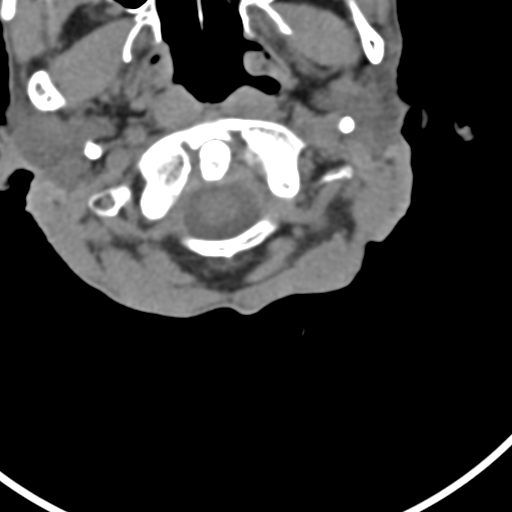

[Series 11: orthogonal bone · axial · 0.23mm/px · z∈[-235,-121]mm · 4 of 97 slices shown, 5 images]
[im 20/97  soft-tissue]
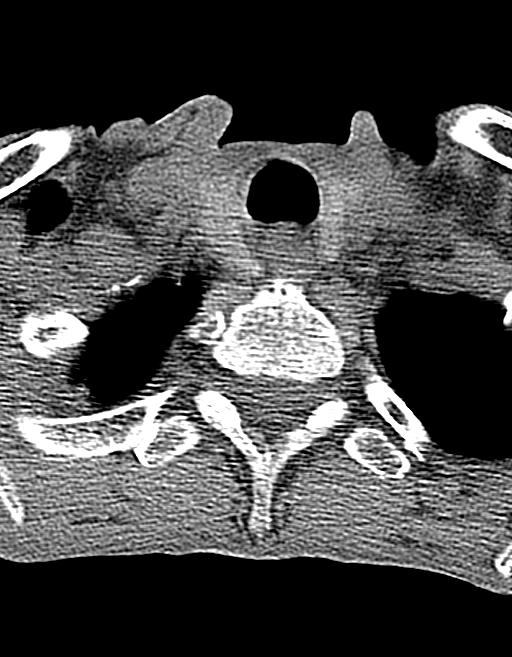
[im 20/97  bone]
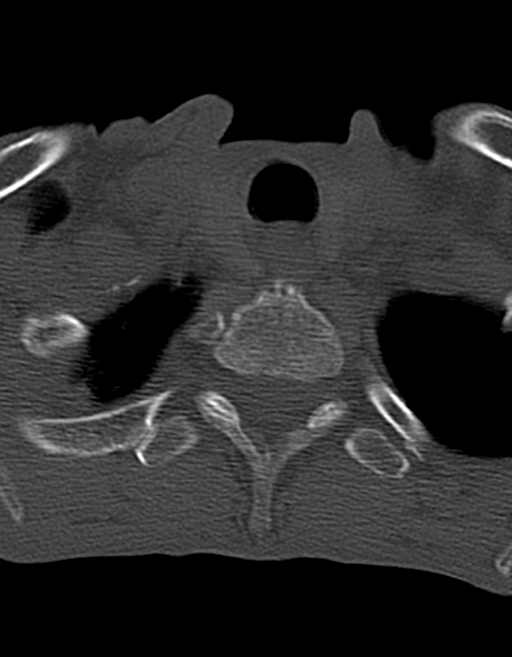
[im 39/97  bone]
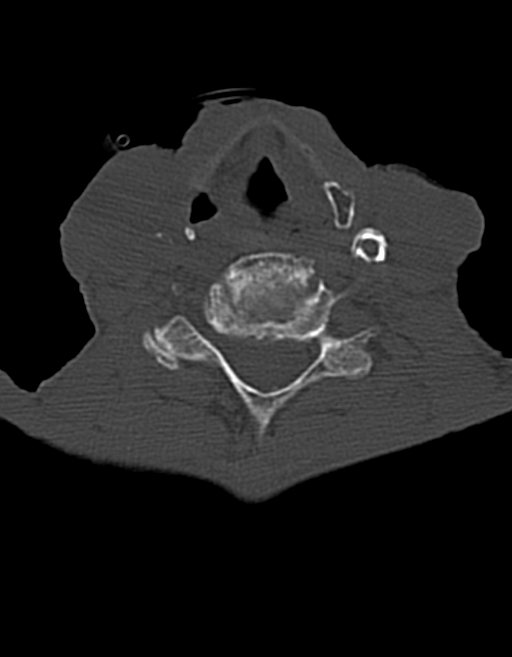
[im 58/97  bone]
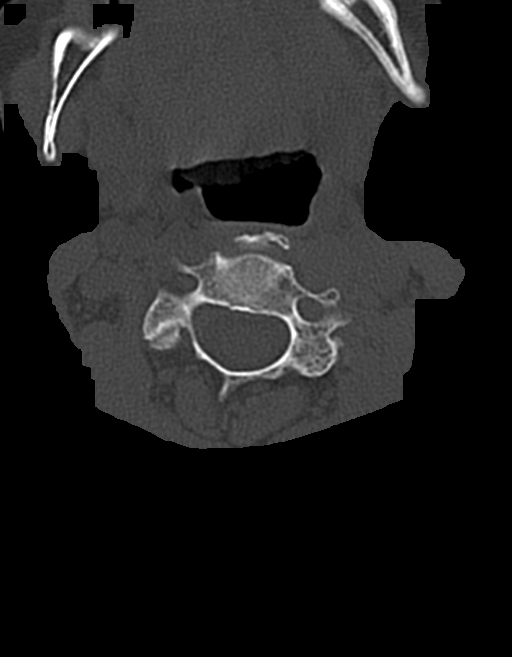
[im 77/97  bone]
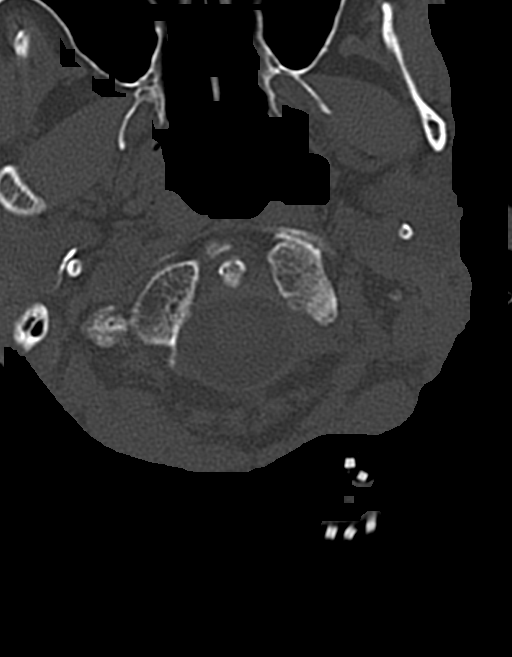

[Series 12: coronal bone · coronal · 0.25mm/px · 1 of 81 slices shown]
[im 41/81  bone]
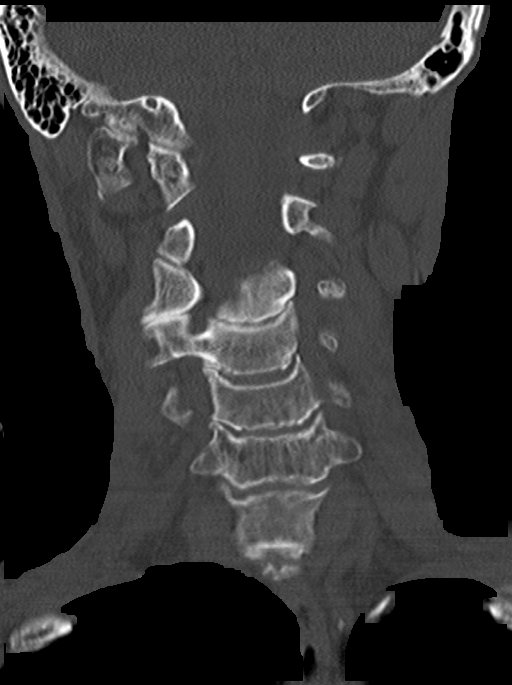

[Series 13: sagittal bone · sagittal · 0.29mm/px · 5 of 61 slices shown]
[im 11/61  bone]
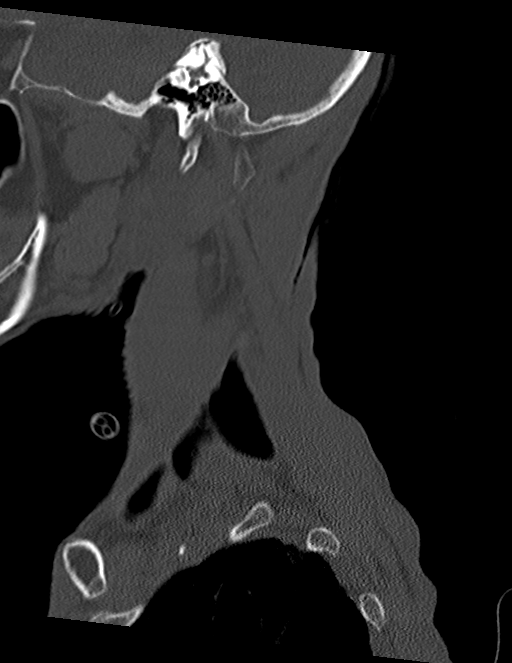
[im 21/61  bone]
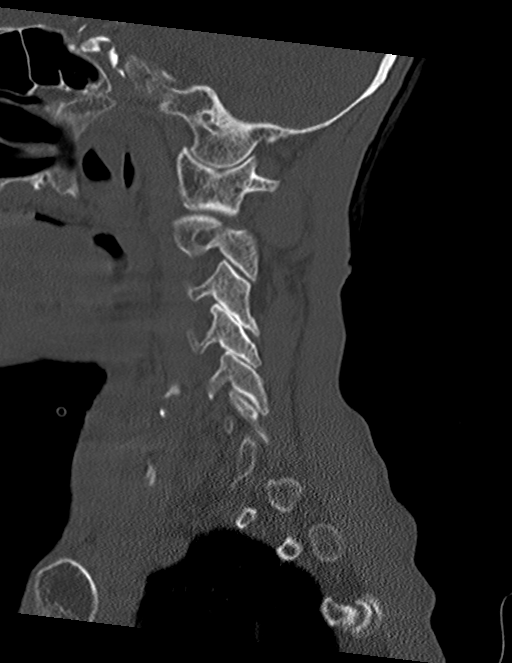
[im 31/61  bone]
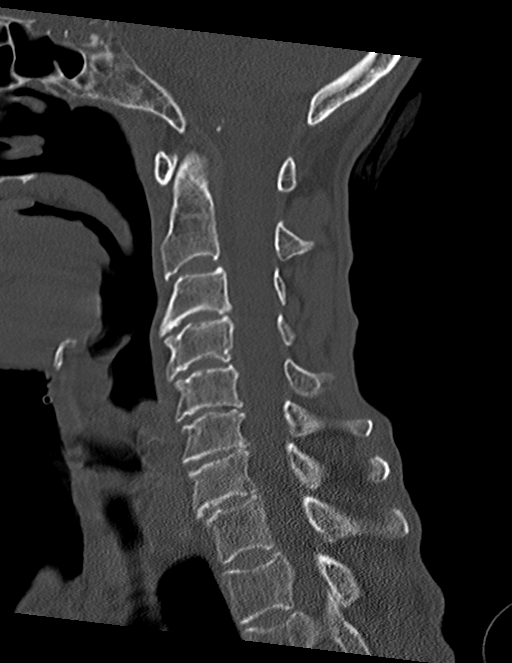
[im 41/61  bone]
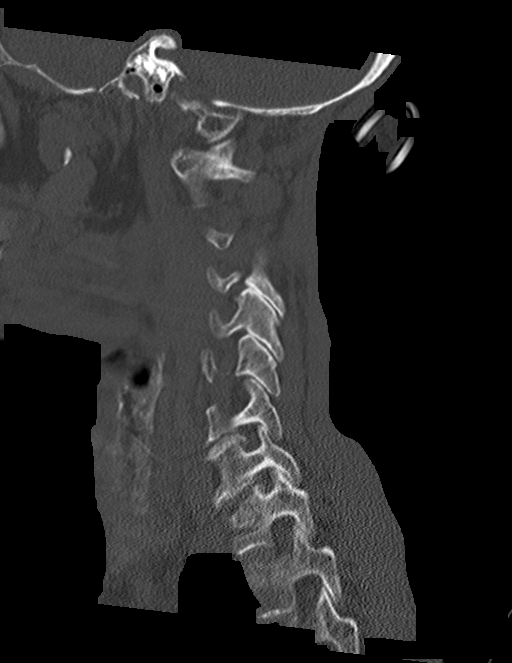
[im 51/61  bone]
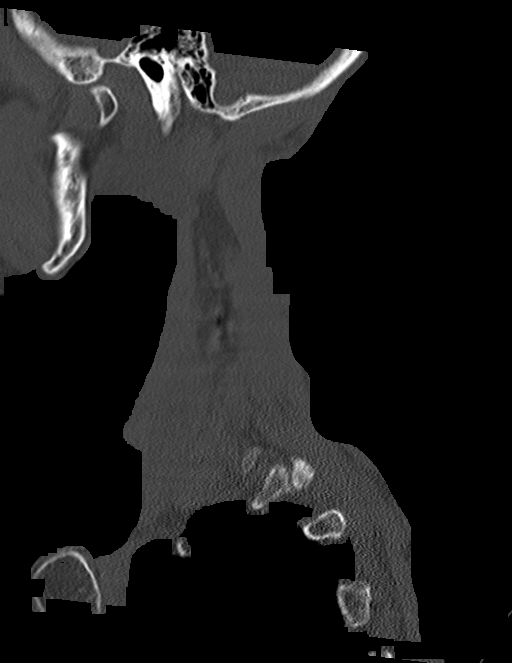

[14 of 33 positions shown; findings below may reference images not displayed]

FINDINGS: CT HEAD FINDINGS

Brain: A 1 cm x 2.5 cm RIGHT frontoparietal hyperdensity/mass with
moderate adjacent vasogenic edema is noted likely representing
malignancy/metastasis. There is no evidence of acute infarct,
definite hemorrhage, extra-axial collection, midline shift or
hydrocephalus.

Atrophy and mild chronic small-vessel white matter ischemic changes
are noted.

Vascular: Atherosclerotic calcifications noted.

Skull: Normal. Negative for fracture or focal lesion.

Sinuses/Orbits: No acute finding.

Other: None.

CT CERVICAL SPINE FINDINGS

Alignment: Normal.

Skull base and vertebrae: No acute fracture. No definite bone lesion
or focal pathologic process.

Soft tissues and spinal canal: No prevertebral fluid or swelling. No
visible canal hematoma.

Disc levels: Moderate multilevel degenerative disc
disease/spondylosis again noted.

Upper chest: A 6 mm RIGHT apical ground-glass nodule is unchanged.
No acute abnormality.

Other: None
IMPRESSION: 1. 1 x 2.5 cm RIGHT frontoparietal hyperdensity/mass with adjacent
vasogenic edema highly suspicious for malignancy/metastasis. No
midline shift, hydrocephalus or gross hemorrhage. MRI of the brain
with and without contrast is recommended for further evaluation.
2. Atrophy and chronic small-vessel white matter ischemic changes.
3. Moderate multilevel degenerative changes within the cervical
spine without acute abnormality.
4. Unchanged 6 mm RIGHT apical ground-glass nodule.

## 2020-06-05 IMAGING — MR MR HEAD W/O CM
9 series · 40 of 48 positions shown · non-contrast
Comparison: CT 11/21/2017

CLINICAL DATA: Left arm and leg weakness, 4 days duration. Left
lower lobe lung mass. History of renal cell cancer.

EXAM:
MRI HEAD WITHOUT CONTRAST
TECHNIQUE: Multiplanar, multiecho pulse sequences of the brain and surrounding
structures were obtained without intravenous contrast.

[Series 3: DWI · axial · 3.0mm · 1.09mm/px · z∈[-80,+68]mm · 8 of 104 slices shown (1 of 4)]
[im 1/104]
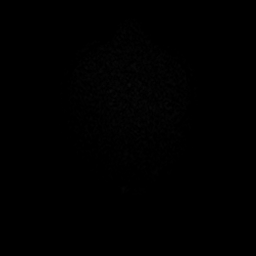
[im 12/104]
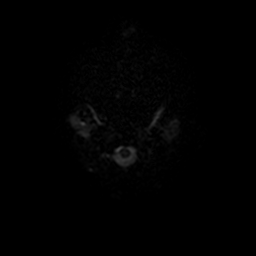
[im 35/104]
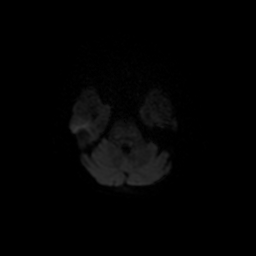
[im 46/104]
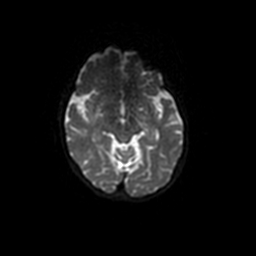
[im 58/104]
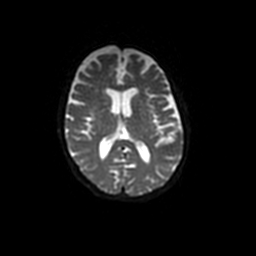
[im 69/104]
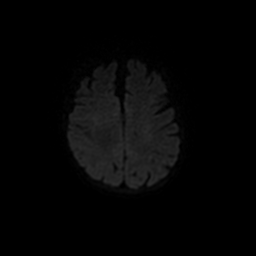
[im 92/104]
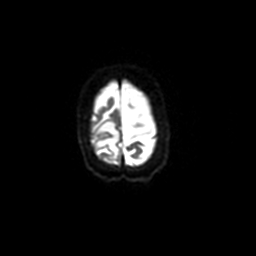
[im 104/104]
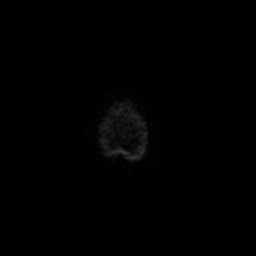

[Series 4: T1 · sagittal · 5.0mm · 0.47mm/px · 2 of 24 slices shown]
[im 1/24]
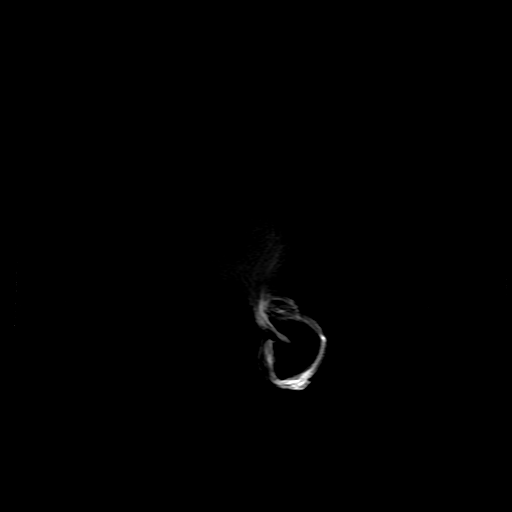
[im 24/24]
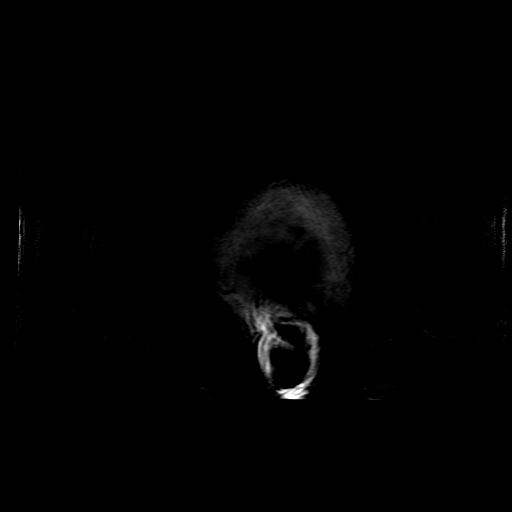

[Series 5: DWI · coronal · 3.0mm · 1.09mm/px · 9 of 118 slices shown (2 of 4)]
[im 1/118]
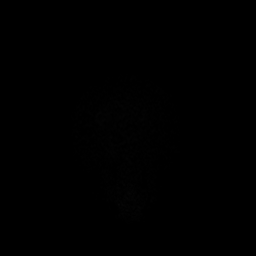
[im 22/118]
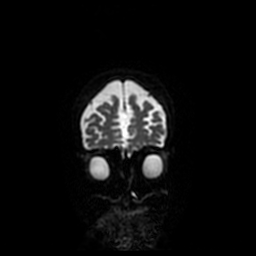
[im 32/118]
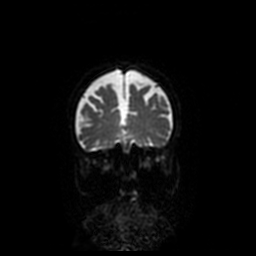
[im 54/118]
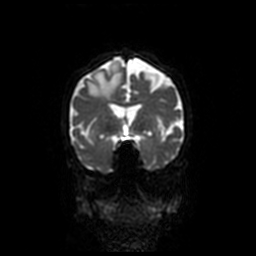
[im 64/118]
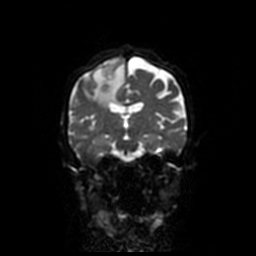
[im 86/118]
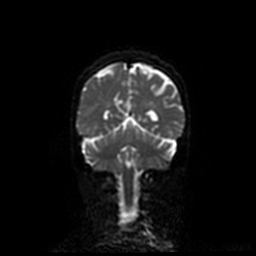
[im 96/118]
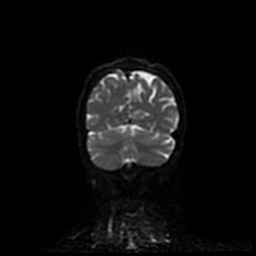
[im 107/118]
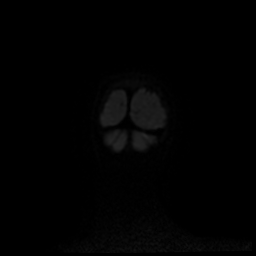
[im 118/118]
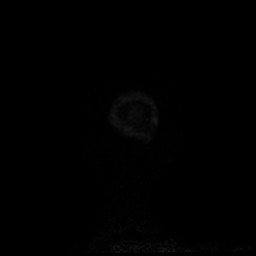

[Series 6: T2 · axial · 5.0mm · 0.43mm/px · z∈[-73,+72]mm · 3 of 26 slices shown]
[im 1/26]
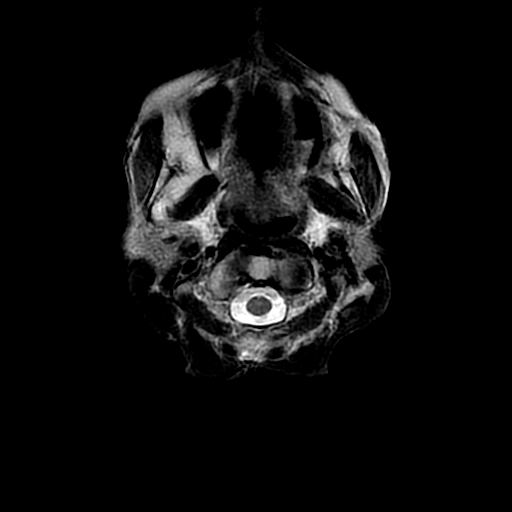
[im 13/26]
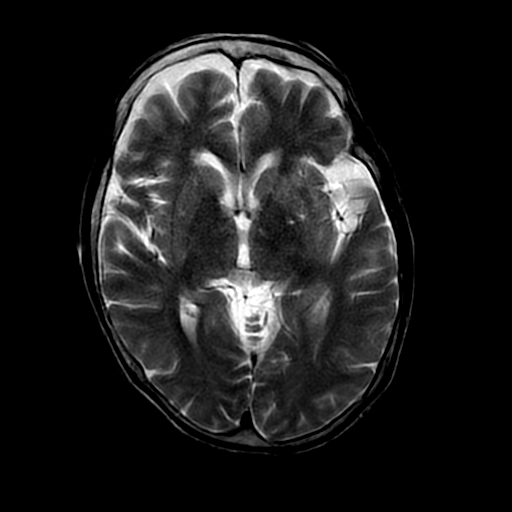
[im 26/26]
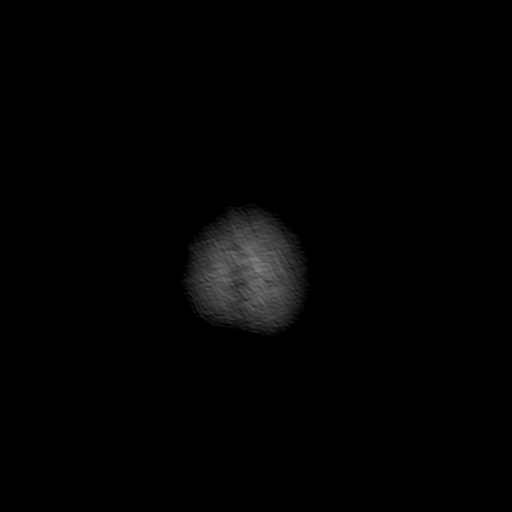

[Series 7: FLAIR · axial · 5.0mm · 0.86mm/px · z∈[-73,+72]mm · 3 of 26 slices shown]
[im 1/26]
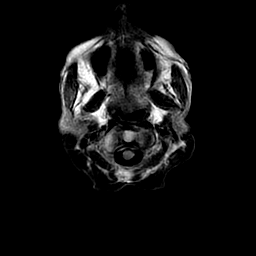
[im 13/26]
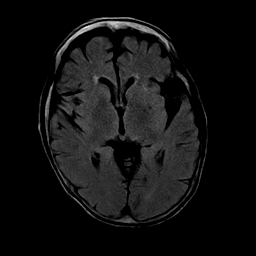
[im 26/26]
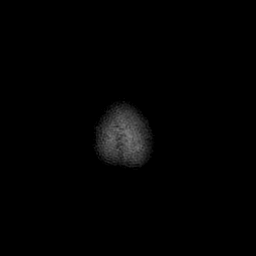

[Series 8: ax mpgr · axial · 5.0mm · 0.43mm/px · z∈[-88,+68]mm · 2 of 24 slices shown]
[im 1/24]
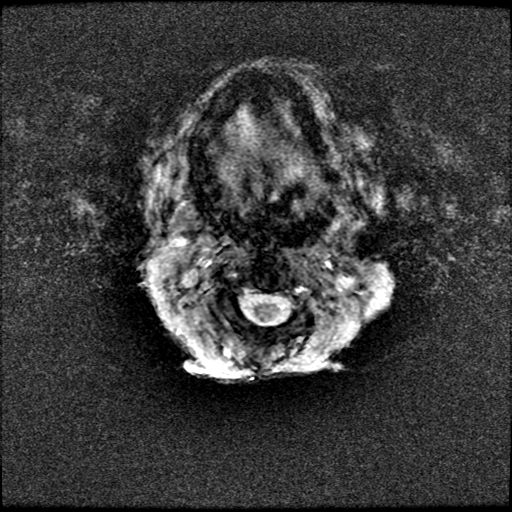
[im 24/24]
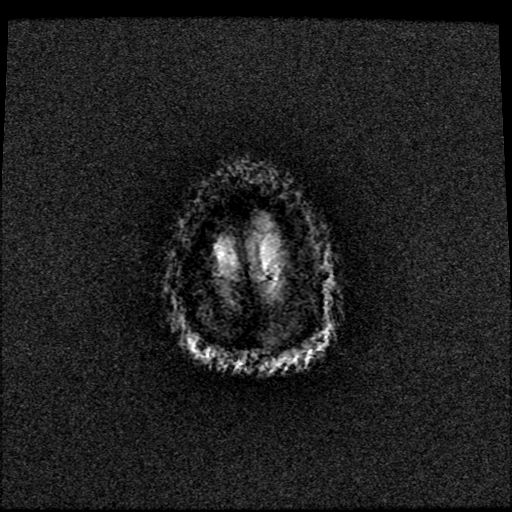

[Series 9: ax fspgr irp · axial · 3.0mm · 0.47mm/px · z∈[-88,-50]mm · 2 of 56 slices shown]
[im 1/56]
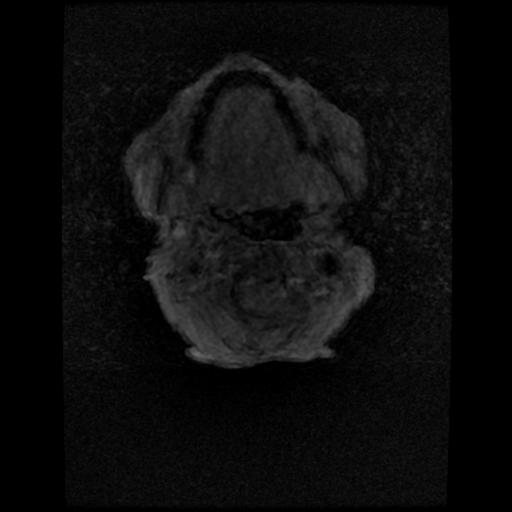
[im 14/56]
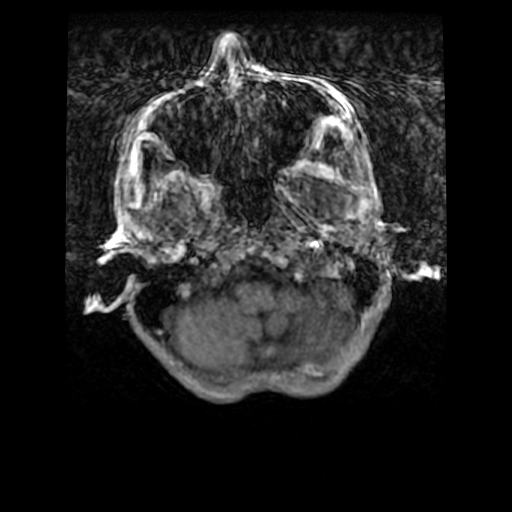

[Series 300: DWI · axial · 3.0mm · 1.09mm/px · z∈[-80,+68]mm · 5 of 52 slices shown (3 of 4)]
[im 1/52]
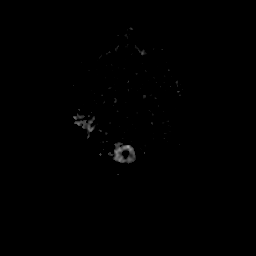
[im 13/52]
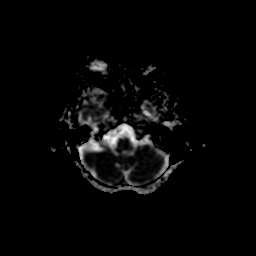
[im 26/52]
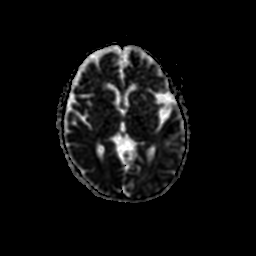
[im 39/52]
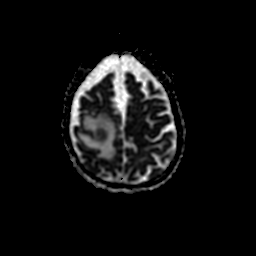
[im 52/52]
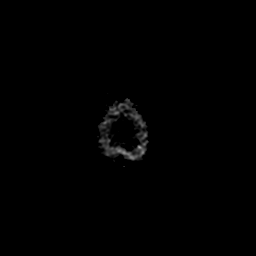

[Series 500: DWI · coronal · 3.0mm · 1.09mm/px · 6 of 59 slices shown (4 of 4)]
[im 1/59]
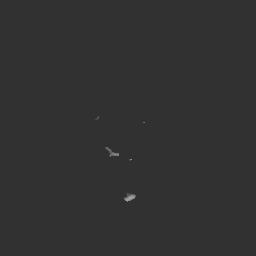
[im 12/59]
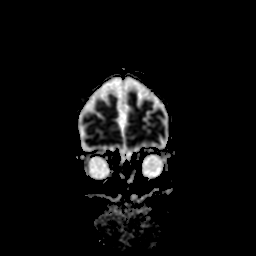
[im 24/59]
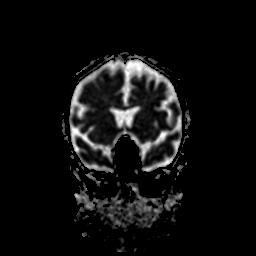
[im 35/59]
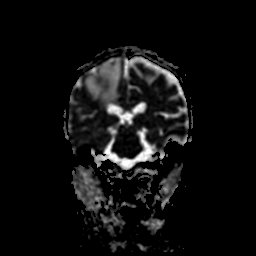
[im 47/59]
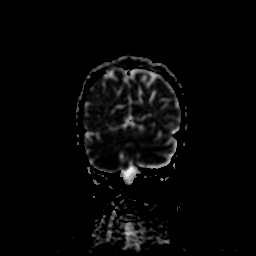
[im 59/59]
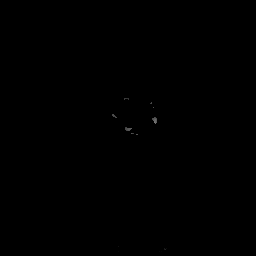

[40 of 48 positions shown; findings below may reference images not displayed]

FINDINGS: Brain: Study suffers from motion degradation. The patient would not
allow contrast administration. There is a 12 mm mass at the right
frontoparietal vertex with surrounding vasogenic edema. Given the
clinical history, this most likely represents a metastasis. No
second lesion is identified, though sensitivity is diminished in the
absence of contrast. If the patient can be convinced,
contrast-enhanced imaging would be useful. Elsewhere, there are mild
chronic small-vessel changes of the white matter. No hydrocephalus
or extra-axial collection. No hemorrhage.

Vascular: Major vessels at the base of the brain show flow.

Skull and upper cervical spine: Negative

Sinuses/Orbits: Clear/normal

Other: None
IMPRESSION: 12 mm mass at the right frontoparietal vertex with pronounced
regional vasogenic edema. This is most likely a metastasis. No
second lesion is identified. However, the patient would not allow
contrast administration, and tiny other lesions are not excluded at
this point.

## 2020-06-05 IMAGING — CR DG CHEST 2V
2 series · 2 of 2 positions shown · non-contrast
Comparison: November 14, 2017 chest radiograph and chest CT October 23, 2017

CLINICAL DATA: Left-sided weakness and shortness of breath

EXAM:
CHEST - 2 VIEW

[w chest lat]
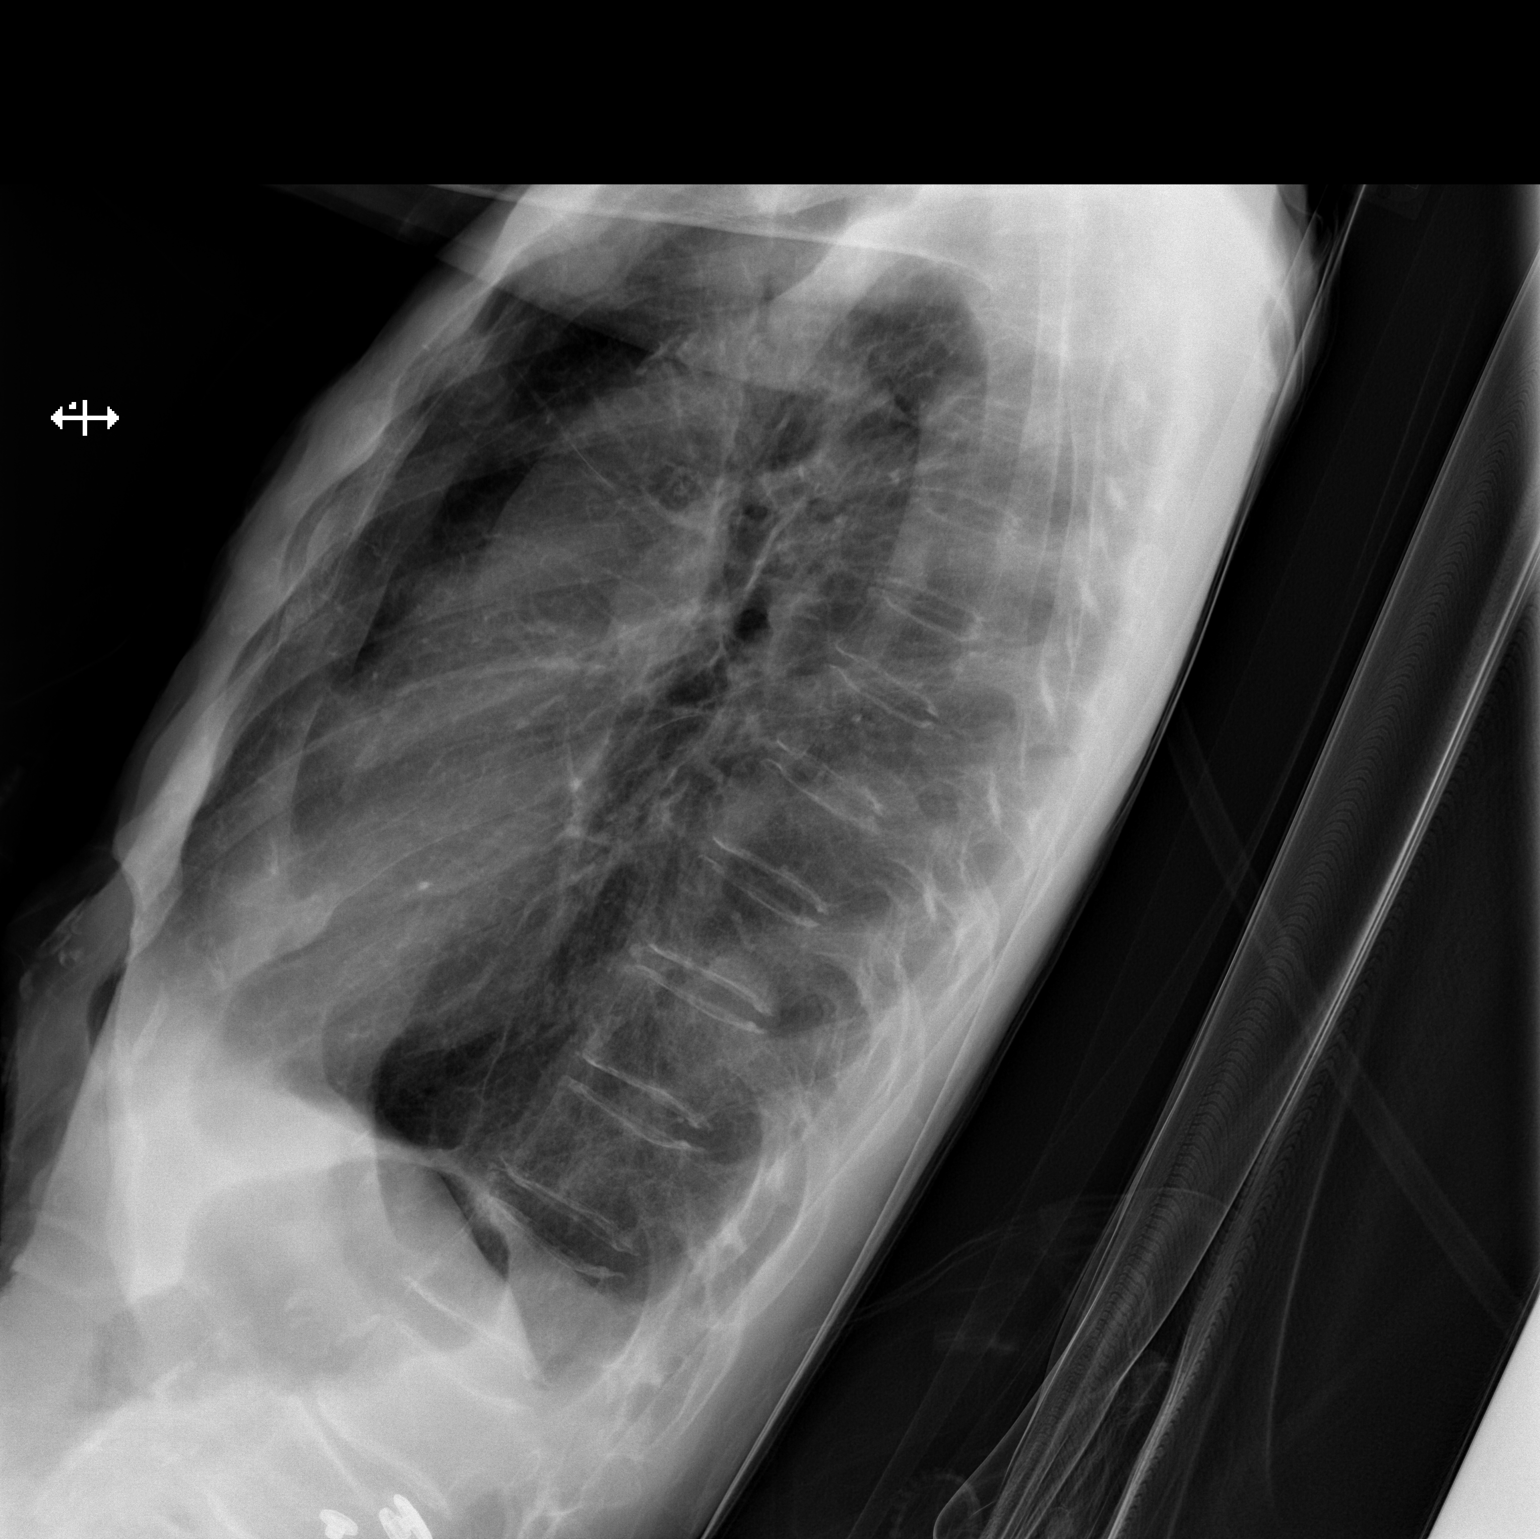

[x chest ap]
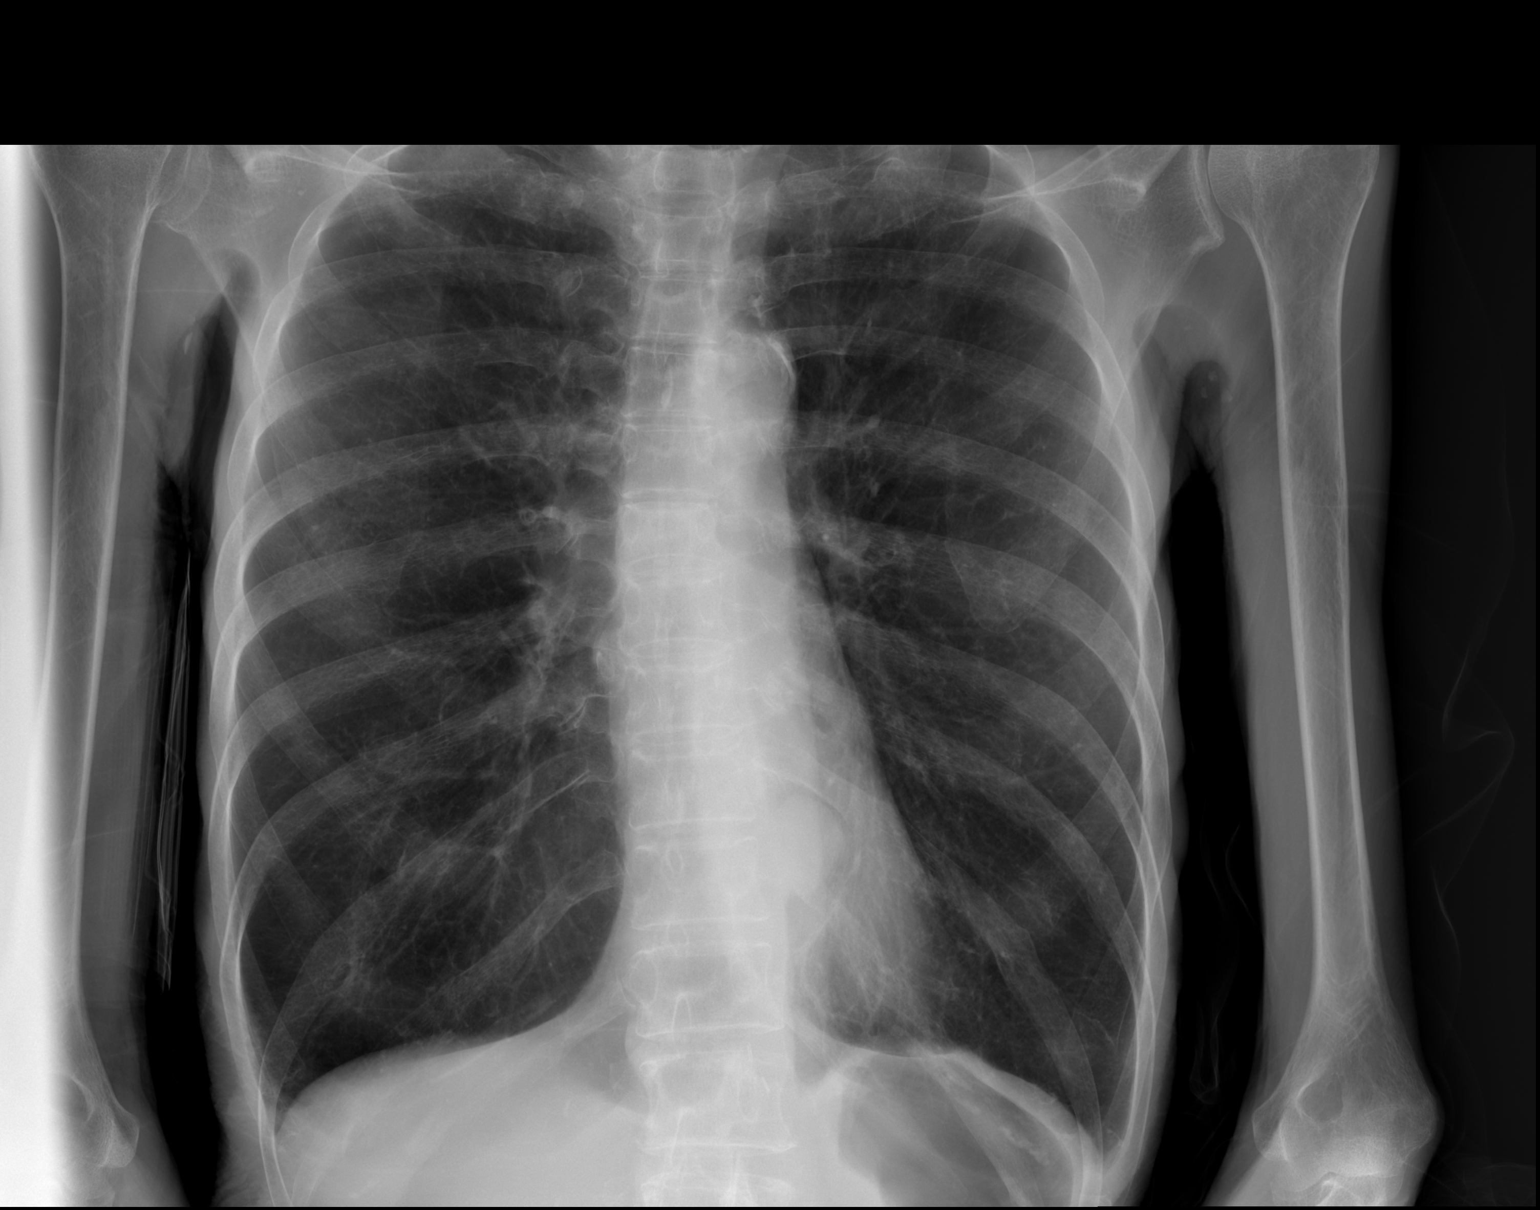

[2 of 2 positions shown; findings below may reference images not displayed]

FINDINGS: Previously noted mass in the medial left lower lobe region is again
noted, measuring 3.4 x 2.7 x 3.0 cm. There is no edema or
consolidation. The heart size and pulmonary vascular normal. There
is aortic atherosclerosis. No evident bone lesions..
IMPRESSION: Persistent mass posteromedial left lower lobe region. No new opacity
evident. No edema or consolidation. Stable cardiac silhouette. There
is aortic atherosclerosis. No evident adenopathy.

Aortic Atherosclerosis ([id number].E).

## 2020-06-05 IMAGING — CT CT ABD-PELV W/O CM
2 of 4 series · 15 of 46 positions shown, 17 images · non-contrast
Comparison: 08/30/2017, 10/10/2017

CLINICAL DATA: Abdominal pain.  Patient refused IV contrast.

EXAM:
CT ABDOMEN AND PELVIS WITHOUT CONTRAST
TECHNIQUE: Multidetector CT imaging of the abdomen and pelvis was performed
following the standard protocol without IV contrast.

[Series 2: axial st · axial · 0.67mm/px · z∈[-728,-384]mm · 12 of 77 slices shown, 14 images]
[im 4/77  soft-tissue]
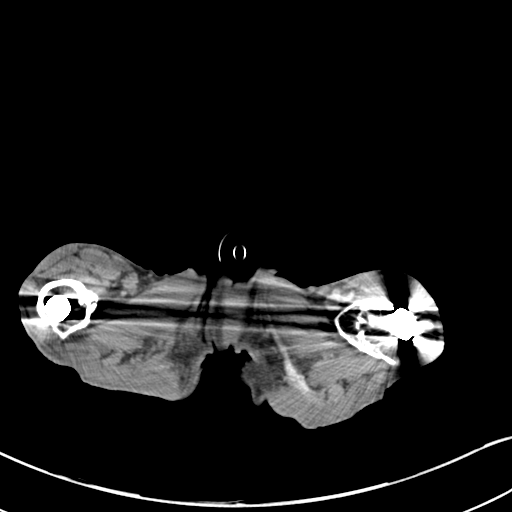
[im 4/77  bone]
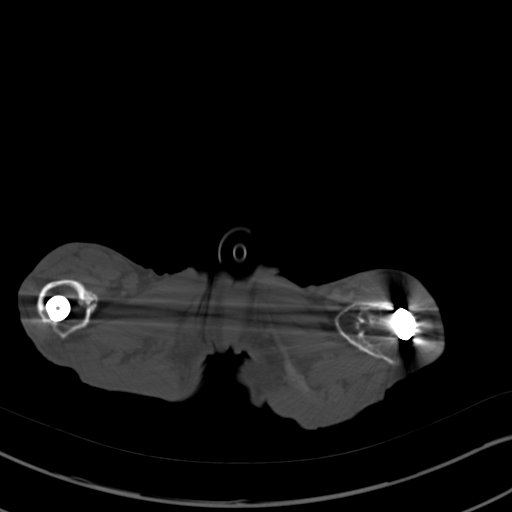
[im 11/77  soft-tissue]
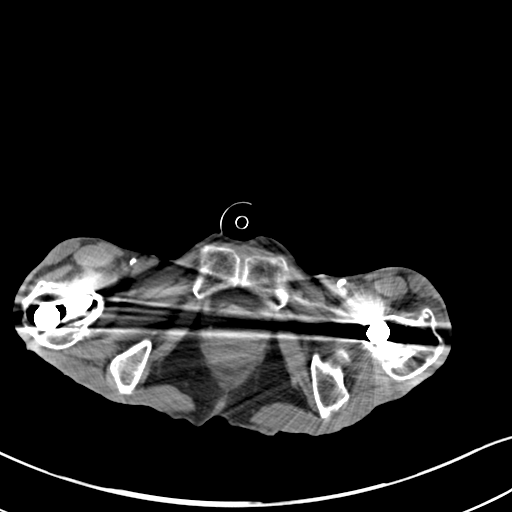
[im 19/77  soft-tissue]
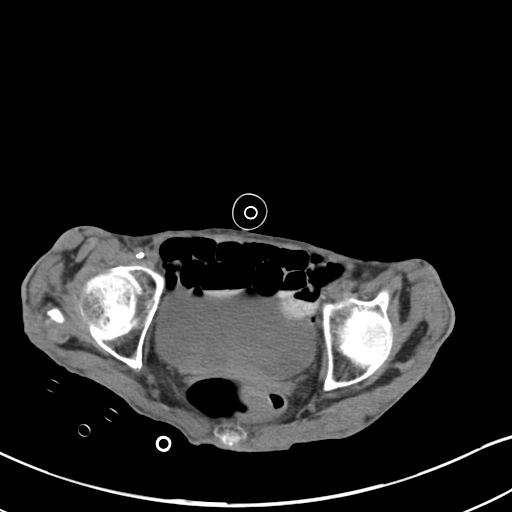
[im 22/77  soft-tissue]
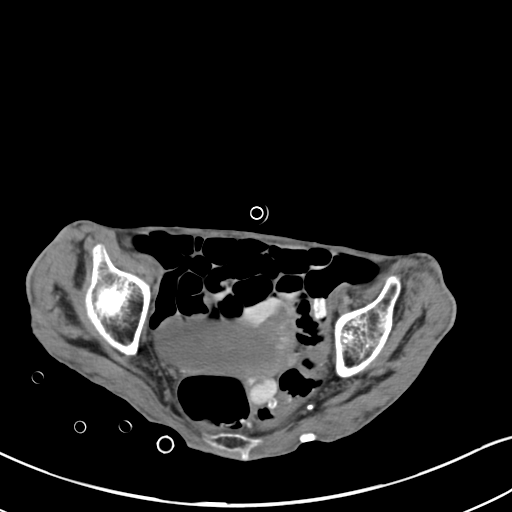
[im 29/77  soft-tissue]
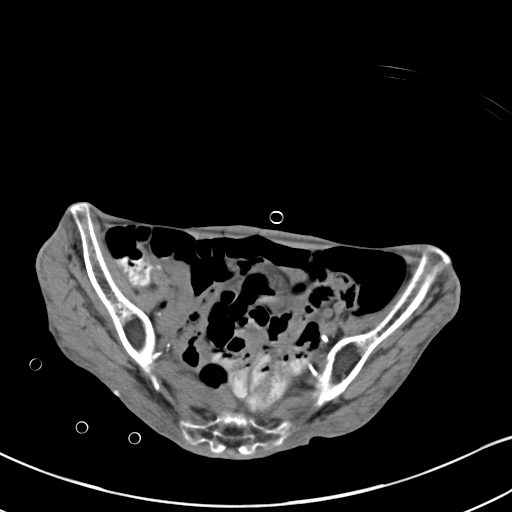
[im 37/77  soft-tissue]
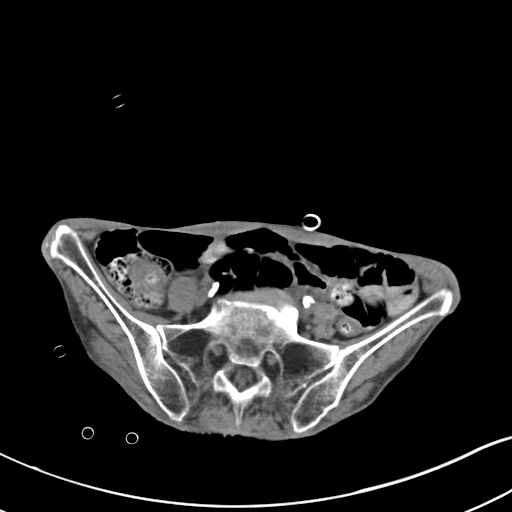
[im 40/77  soft-tissue]
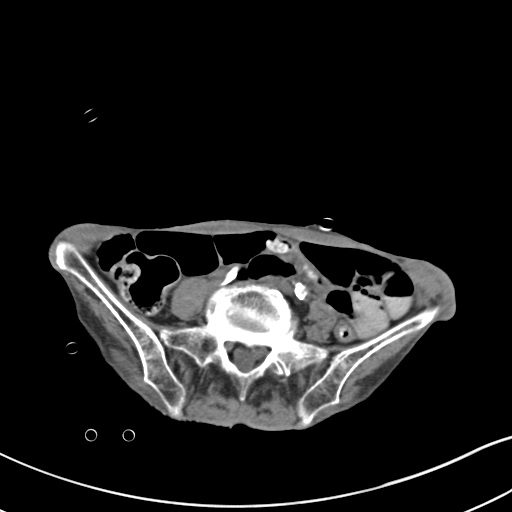
[im 48/77  soft-tissue]
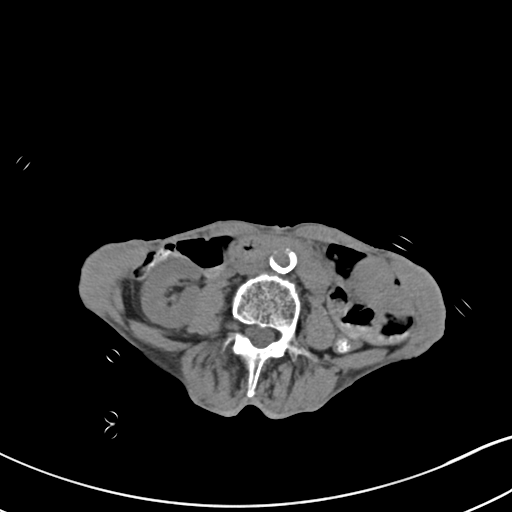
[im 55/77  soft-tissue]
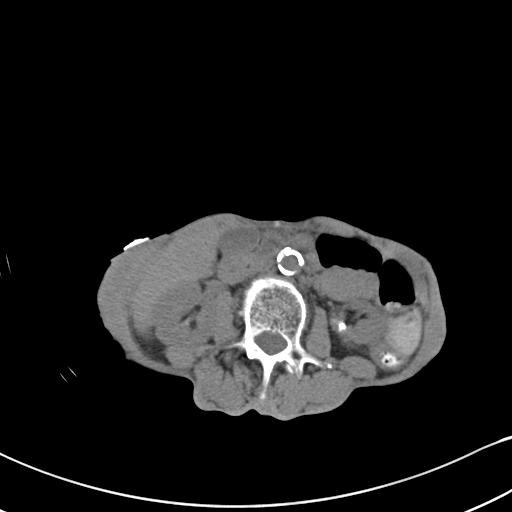
[im 55/77  bone]
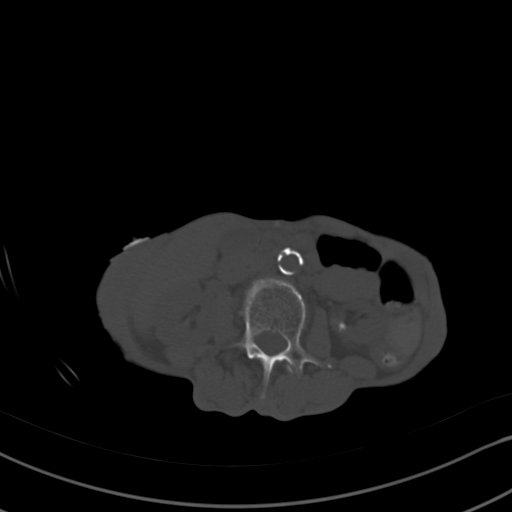
[im 58/77  soft-tissue]
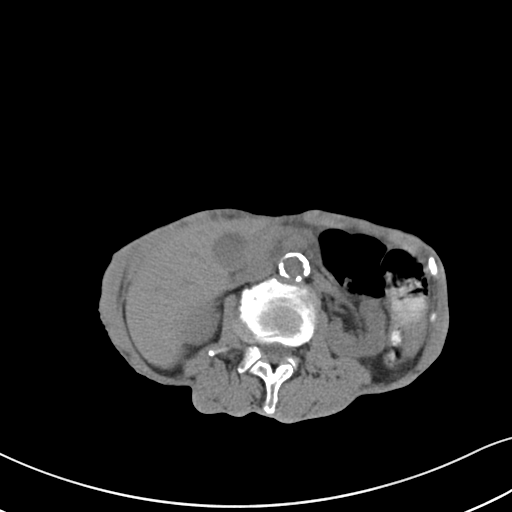
[im 66/77  soft-tissue]
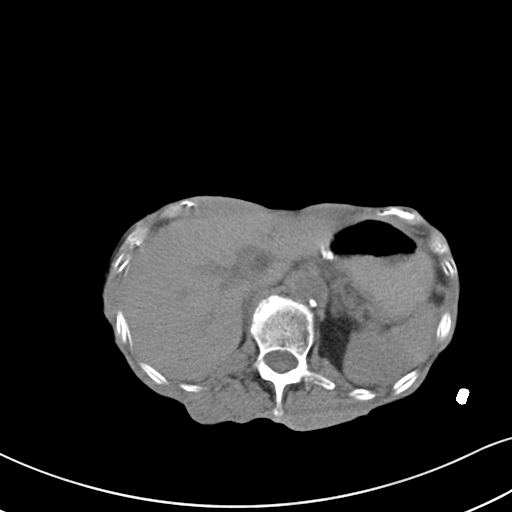
[im 73/77  soft-tissue]
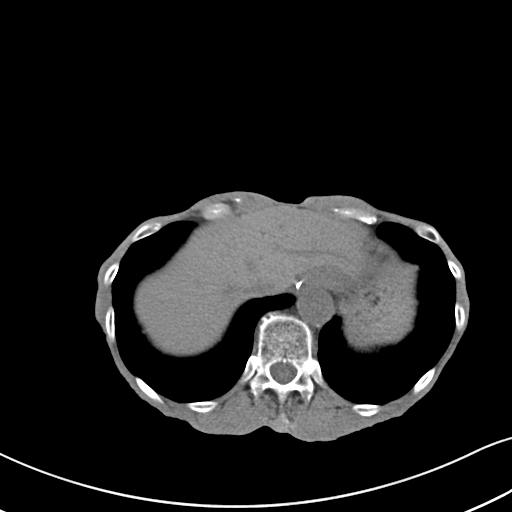

[Series 4: coronal st · coronal · 0.54mm/px · 3 of 57 slices shown]
[im 19/57  soft-tissue]
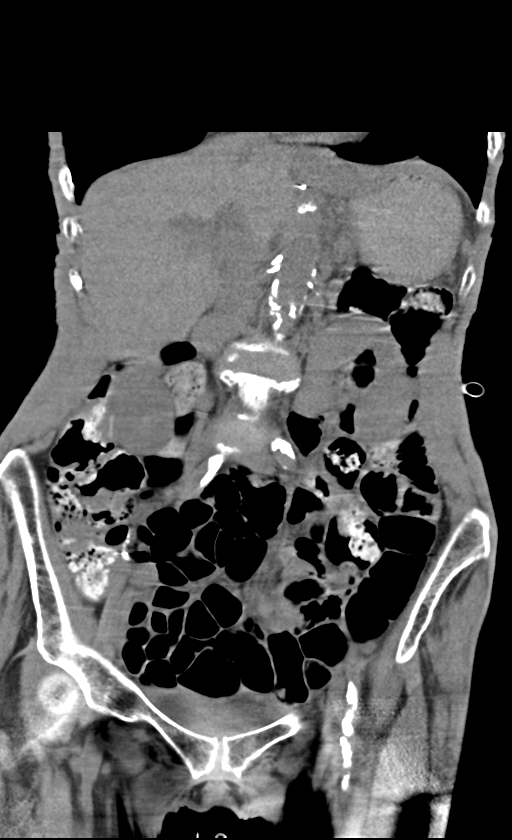
[im 25/57  soft-tissue]
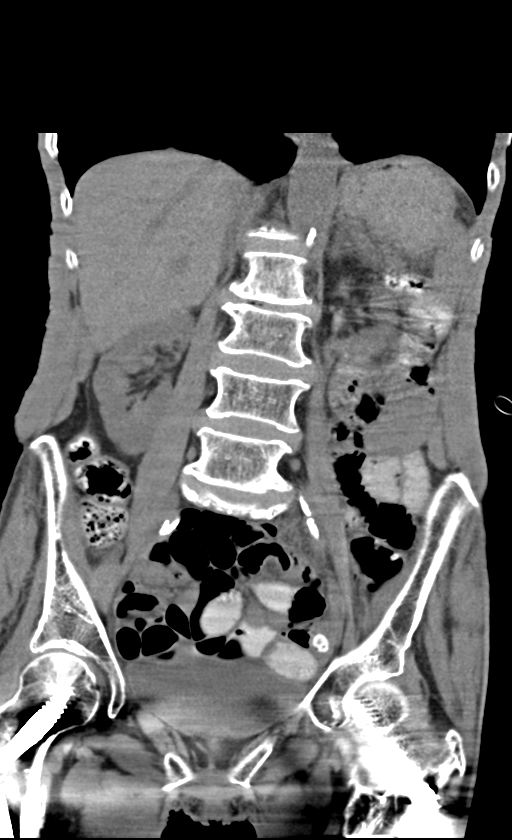
[im 32/57  soft-tissue]
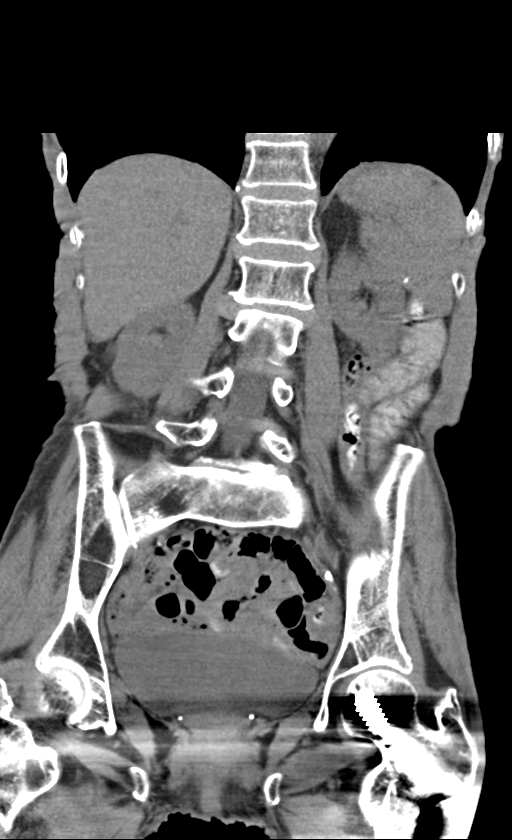

[15 of 46 positions shown; findings below may reference images not displayed]

FINDINGS: Lower chest: Mild bibasilar scarring.

Hepatobiliary: No focal liver abnormality is seen. No gallstones,
gallbladder wall thickening. Persistent stable dilatation of the
common bile duct unchanged compared with multiple prior exams.

Pancreas: Unremarkable. No pancreatic ductal dilatation or
surrounding inflammatory changes.

Spleen: Normal in size without focal abnormality.

Adrenals/Urinary Tract: Adrenal glands are unremarkable. No
urolithiasis or obstructive uropathy. Partially calcified 13 mm left
lower pole renal mass consistent with posttreatment changes from
prior ablation. Bladder is unremarkable.

Stomach/Bowel: Stomach is within normal limits. No evidence of bowel
wall thickening, distention, or inflammatory changes.

Vascular/Lymphatic: Abdominal aortic atherosclerosis. Normal caliber
abdominal aorta. No lymphadenopathy.

Reproductive: Status post hysterectomy. No adnexal masses.

Other: No abdominal wall hernia or abnormality. No abdominopelvic
ascites. No pneumatosis, pneumoperitoneum or portal venous gas.

Musculoskeletal: Degenerative disc disease with disc height loss at
L2-3 and L5-S1. Minimal retrolisthesis of L2 on L3. Broad-based disc
bulges at L2-3, L3-4, L4-5 and L5-S1. Bilateral facet arthropathy at
L4-5 and L5-S1. Left foraminal stenosis at L5-S1.
IMPRESSION: 1. No acute abdominal or pelvic pathology.
2.  Aortic Atherosclerosis ([id number].T).
3. Lumbar spine spondylosis.

## 2020-06-06 IMAGING — MR MR HEAD WO/W CM
9 of 11 series · 24 of 48 positions shown · IV contrast (gadavist)
Comparison: Noncontrast brain MRI 11/21/2017

CLINICAL DATA: Brain metastasis.  Left lung mass.

EXAM:
MRI HEAD WITHOUT AND WITH CONTRAST
TECHNIQUE: Multiplanar, multiecho pulse sequences of the brain and surrounding
structures were obtained without and with intravenous contrast.
CONTRAST:  3 mL Gadavist

[Series 3: FLAIR · axial · 3.0mm · 0.43mm/px · z∈[-27,+150]mm · 3 of 60 slices shown (1 of 3)]
[im 1/60]
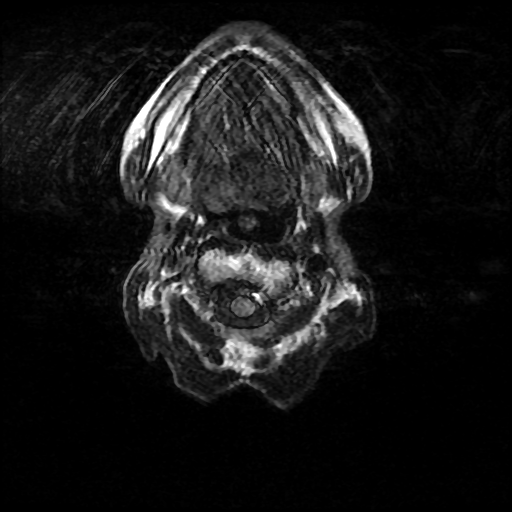
[im 30/60]
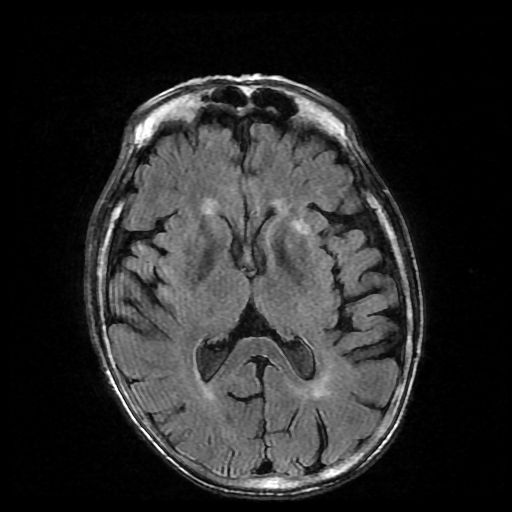
[im 60/60]
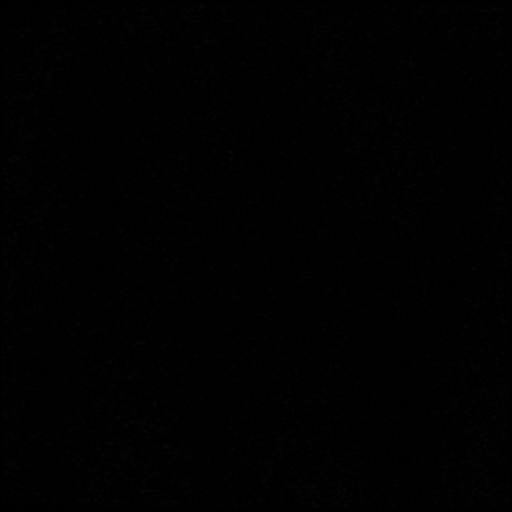

[Series 5: DWI · axial · 3.0mm · 0.94mm/px · z∈[+31,+164]mm · 5 of 94 slices shown]
[im 1/94]
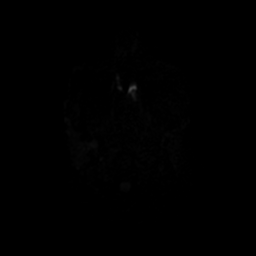
[im 24/94]
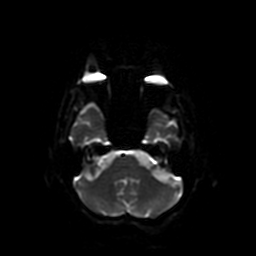
[im 47/94]
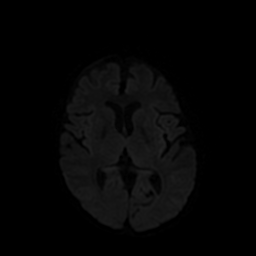
[im 70/94]
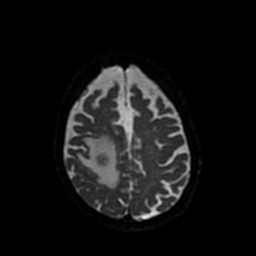
[im 94/94]
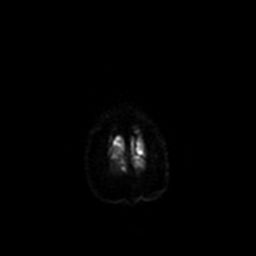

[Series 7: FLAIR · sagittal · 3.0mm · 0.47mm/px · 3 of 48 slices shown (2 of 3)]
[im 1/48]
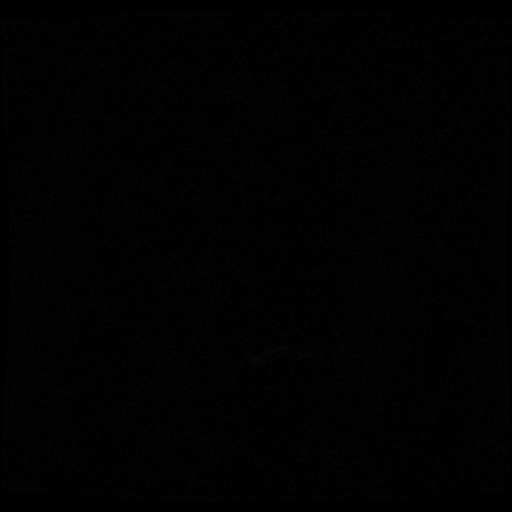
[im 24/48]
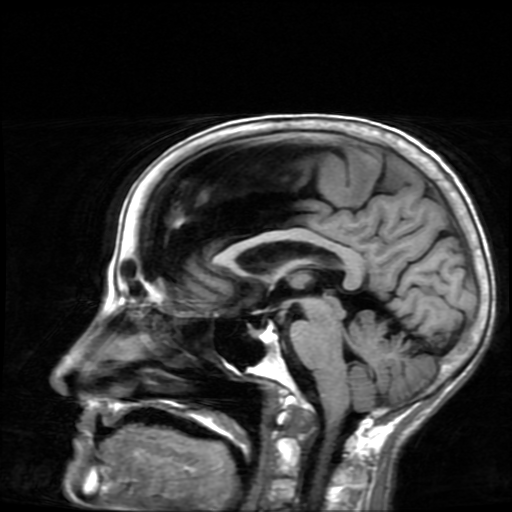
[im 48/48]
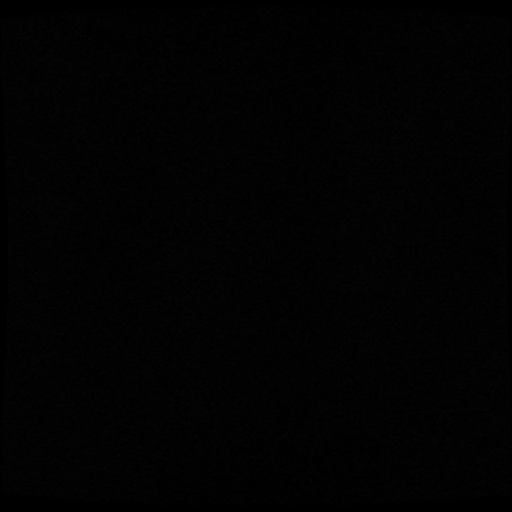

[Series 8: GRE · axial · 5.0mm · 0.43mm/px · 1 of 24 slices shown]
[im 1/24]
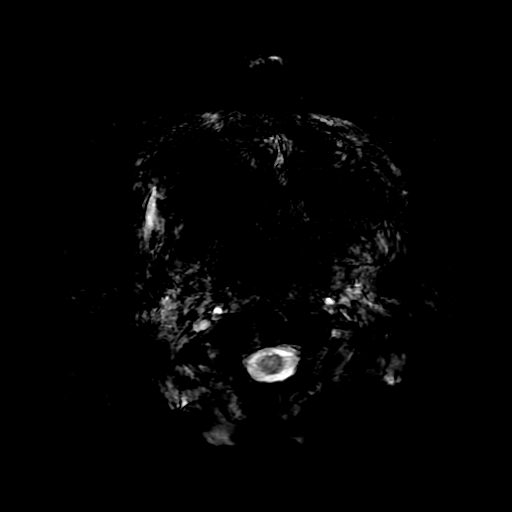

[Series 9: T2 · axial · 5.0mm · 0.43mm/px · z∈[-26,+148]mm · 2 of 30 slices shown]
[im 1/30]
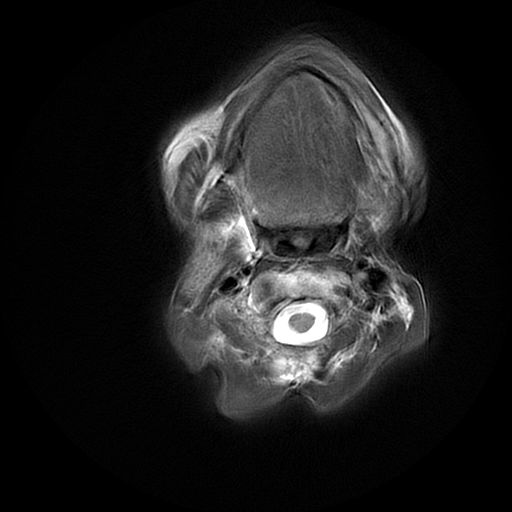
[im 30/30]
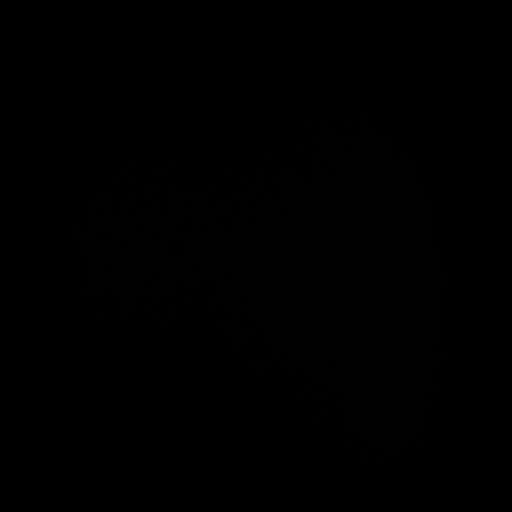

[Series 14: T2 post-contrast · coronal · 3.0mm · 0.39mm/px · 3 of 42 slices shown]
[im 1/42]
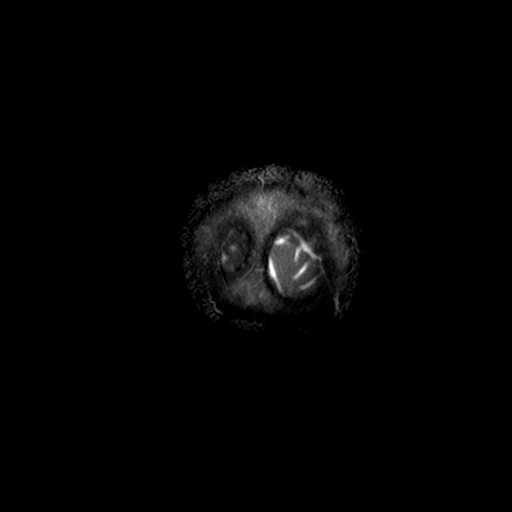
[im 21/42]
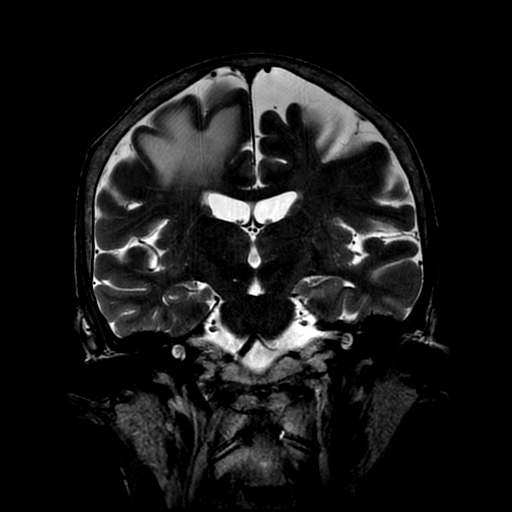
[im 42/42]
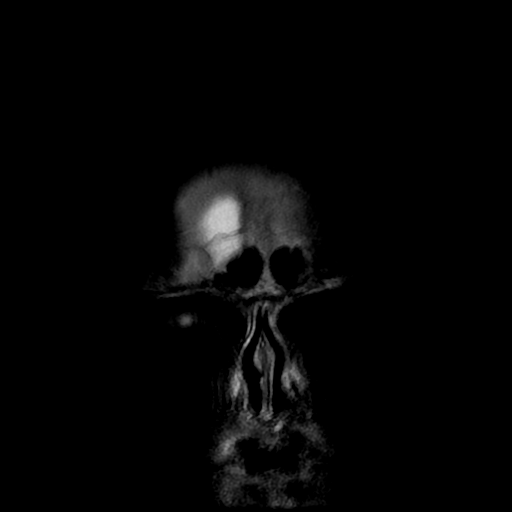

[Series 16: T1 · coronal · 3.0mm · 0.39mm/px · 1 of 42 slices shown]
[im 1/42]
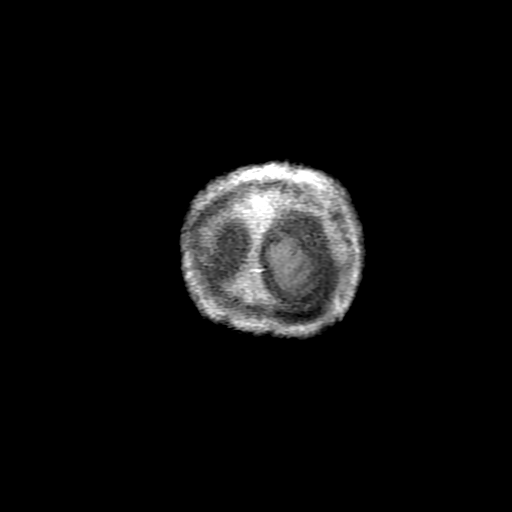

[Series 17: FLAIR · sagittal · 3.0mm · 0.47mm/px · 3 of 47 slices shown (3 of 3)]
[im 1/47]
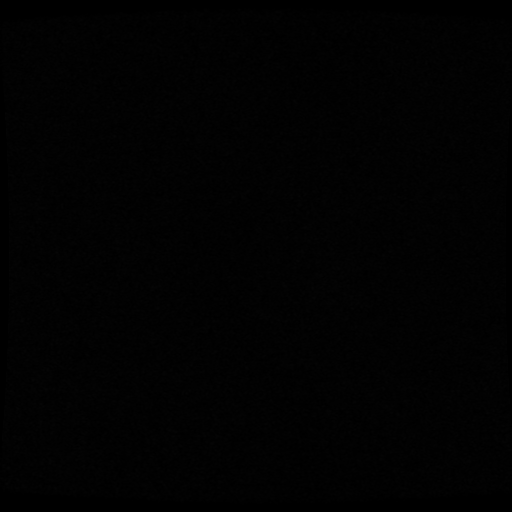
[im 24/47]
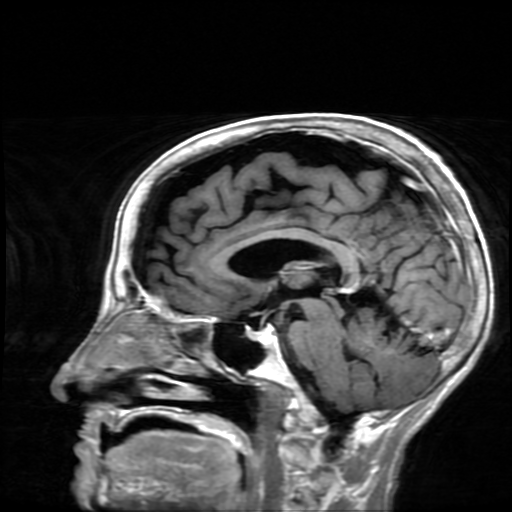
[im 47/47]
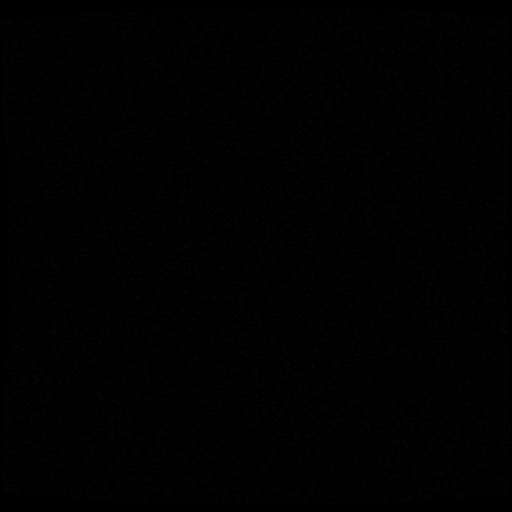

[Series 550: ADC · axial · 3.0mm · 0.94mm/px · z∈[+31,+164]mm · 3 of 47 slices shown]
[im 1/47]
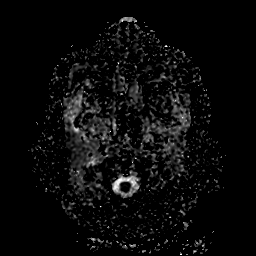
[im 24/47]
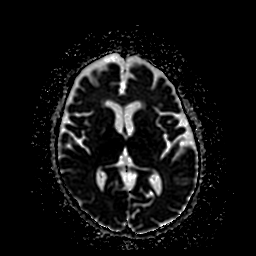
[im 47/47]
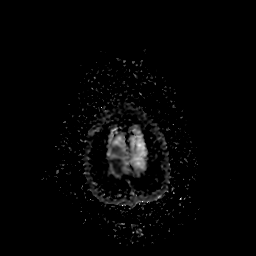

[24 of 48 positions shown; findings below may reference images not displayed]

FINDINGS: Brain: A heterogeneously enhancing bilobed mass in the right
frontoparietal region near the vertex measures 2.9 x 1.2 cm with
unchanged extensive surrounding vasogenic edema. Heterogeneously
restricted diffusion within the mass suggests hypercellularity.
There is regional sulcal effacement without midline shift. No
enhancing lesion is identified elsewhere.

There is no evidence of acute infarct, intracranial hemorrhage, or
extra-axial fluid collection. Scattered small foci of T2
hyperintensity in the cerebral white matter bilaterally are
nonspecific but compatible with mild chronic small vessel ischemic
disease. There is moderate cerebral atrophy.

Vascular: Major intracranial vascular flow voids are preserved.

Skull and upper cervical spine: No suspicious marrow lesion.

Sinuses/Orbits: Left cataract extraction. Paranasal sinuses and
mastoid air cells are clear.

Other: None.
IMPRESSION: 1. 2.9 cm right frontoparietal mass likely representing a
metastasis.
2. No second lesion identified.
3. Mild chronic small vessel ischemic disease and moderate cerebral
atrophy.

## 2020-08-21 IMAGING — CT CT CHEST W/ CM
2 of 5 series · 12 of 36 positions shown, 15 images · IV contrast (APPLIED)
Comparison: Multiple exams, including CT examinations from
11/21/2017 and 10/23/2017 as well as PET-CT from 10/03/2017

CLINICAL DATA: Restaging of metastatic left lower lobe lung cancer

EXAM:
CT CHEST, ABDOMEN, AND PELVIS WITH CONTRAST
TECHNIQUE: Multidetector CT imaging of the chest, abdomen and pelvis was
performed following the standard protocol during bolus
administration of intravenous contrast.
CONTRAST:  80mL OMNIPAQUE IOHEXOL 300 MG/ML  SOLN

[Series 2: cap with · axial · 0.71mm/px · z∈[-379,+126]mm · 9 of 125 slices shown, 12 images]
[im 12/125  mediastinal]
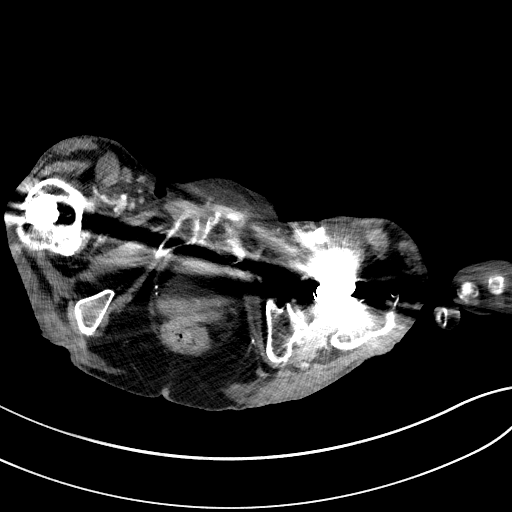
[im 12/125  lung]
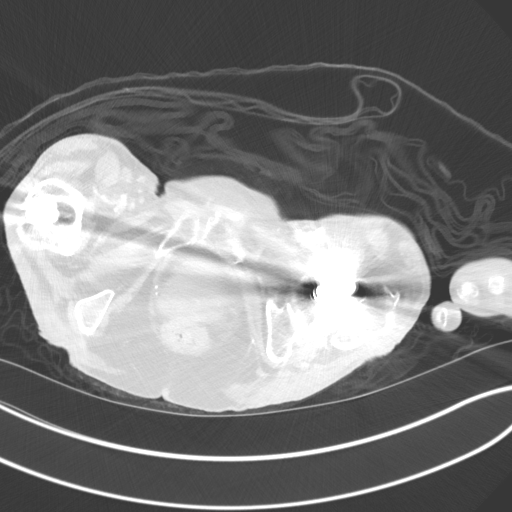
[im 23/125  lung]
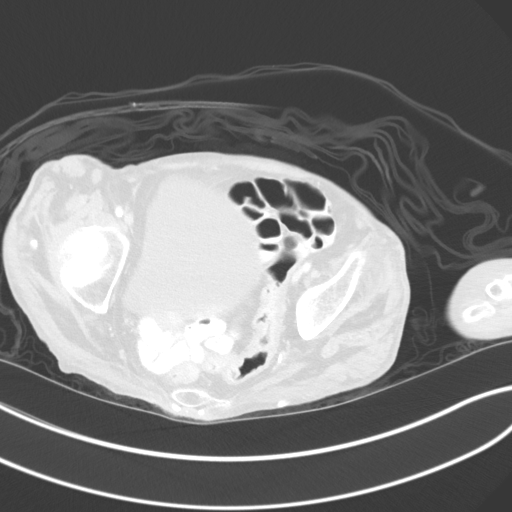
[im 34/125  lung]
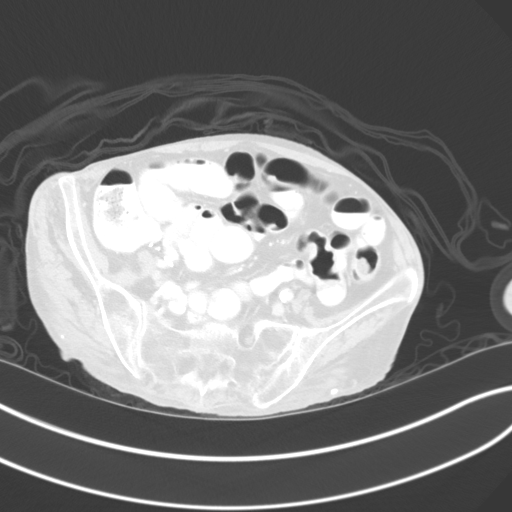
[im 46/125  lung]
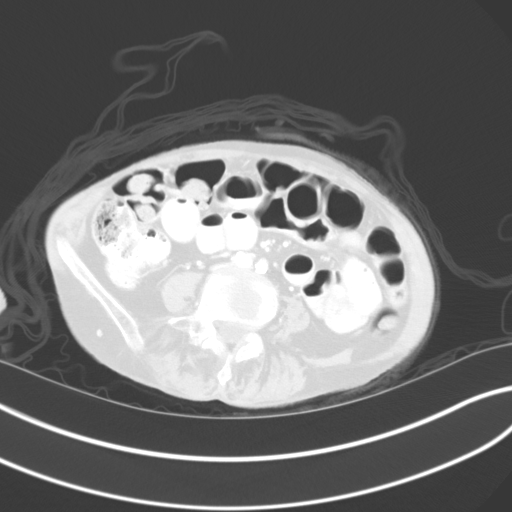
[im 68/125  mediastinal]
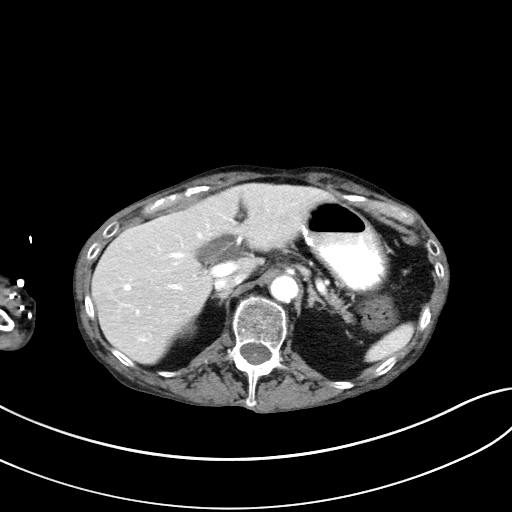
[im 68/125  lung]
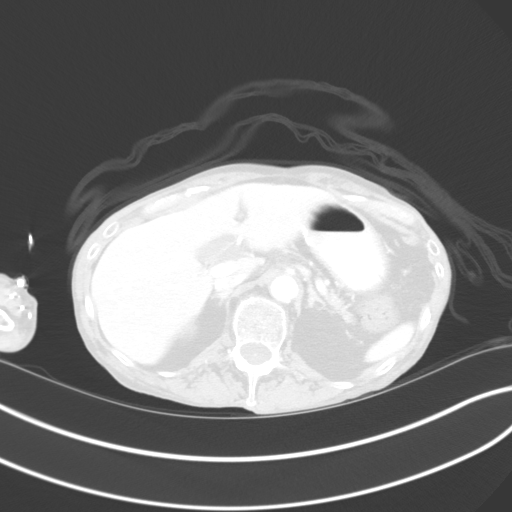
[im 79/125  lung]
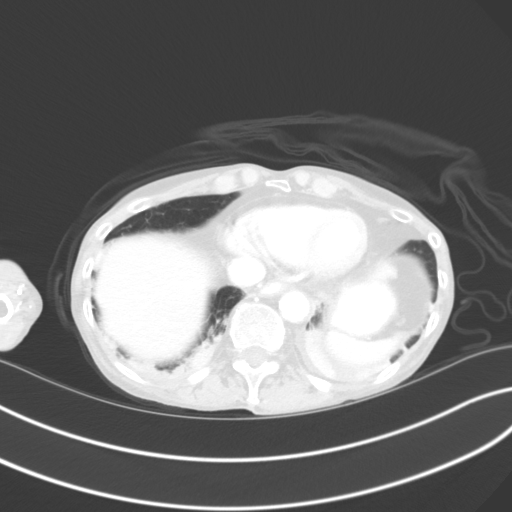
[im 91/125  lung]
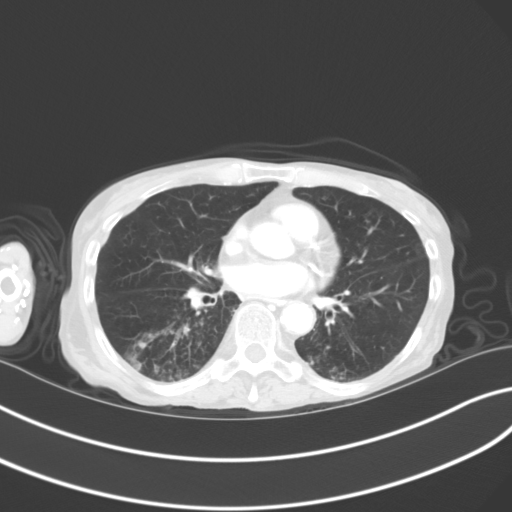
[im 102/125  lung]
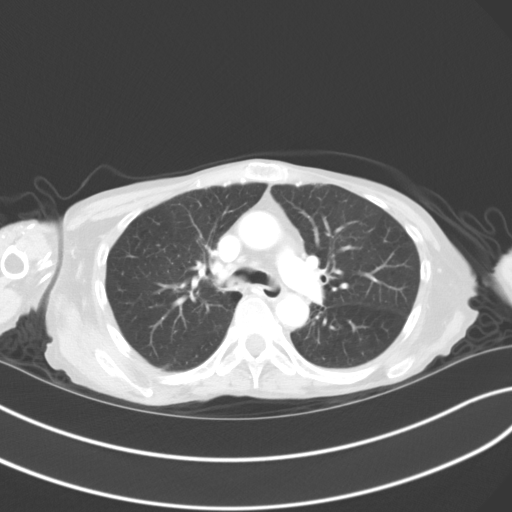
[im 113/125  mediastinal]
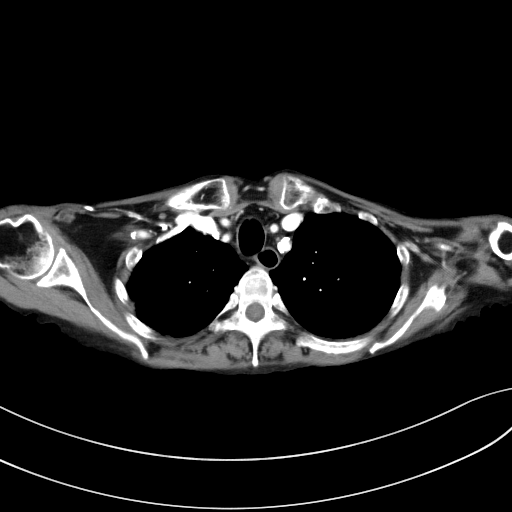
[im 113/125  lung]
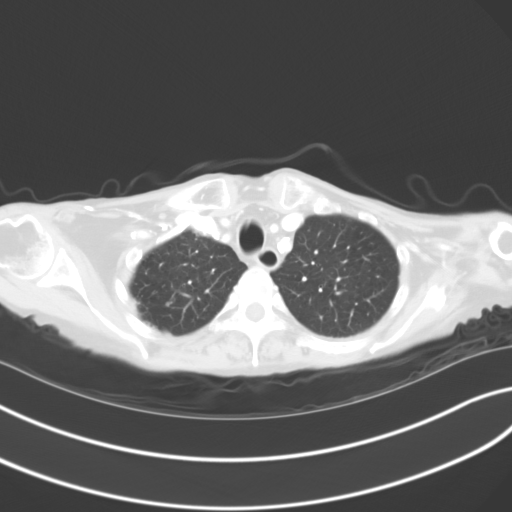

[Series 4: coronals · coronal · 0.91mm/px · 3 of 104 slices shown]
[im 21/104  lung]
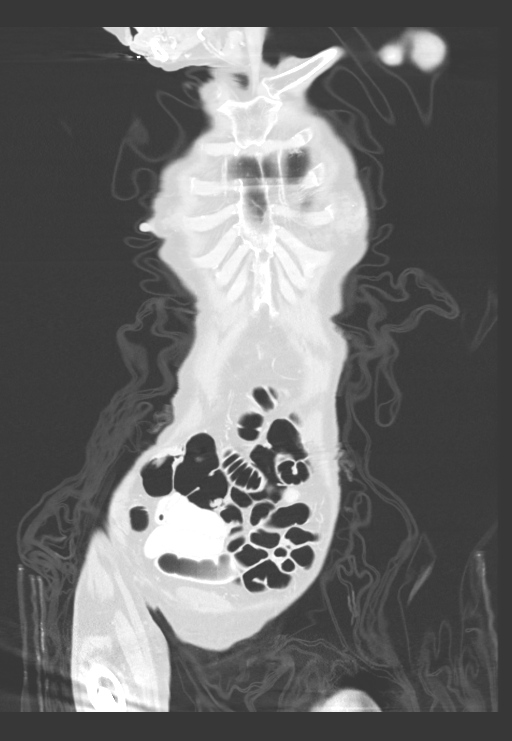
[im 42/104  lung]
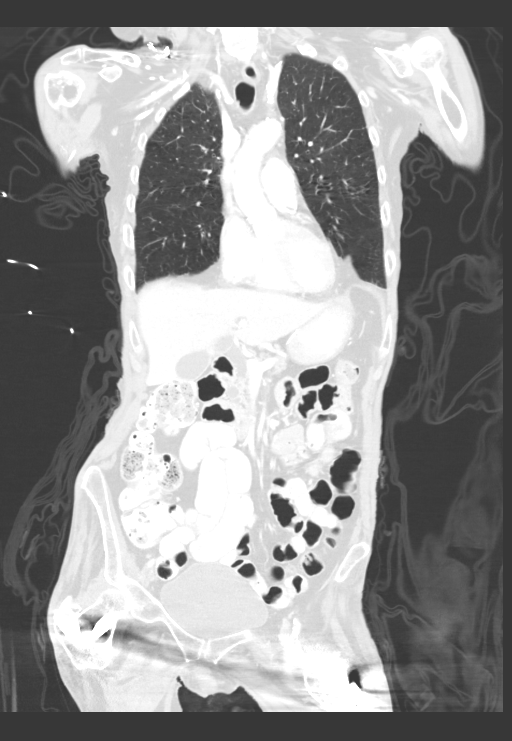
[im 62/104  lung]
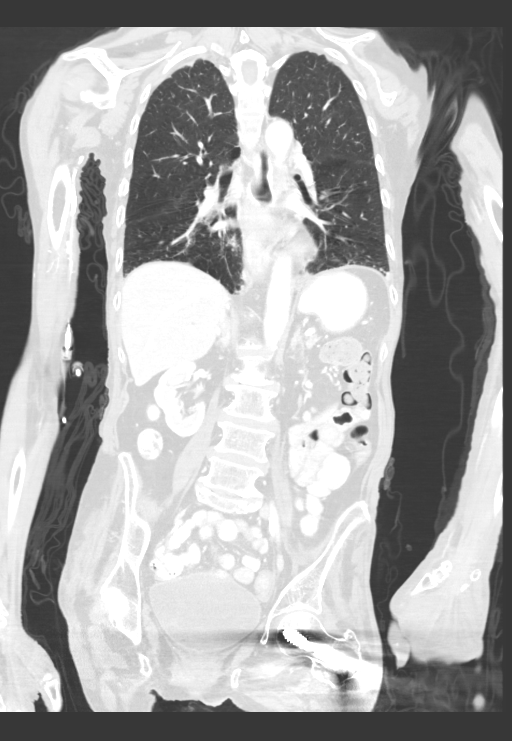

[12 of 36 positions shown; findings below may reference images not displayed]

FINDINGS: CT CHEST FINDINGS

Cardiovascular: Coronary, aortic arch, and branch vessel
atherosclerotic vascular disease.

Mediastinum/Nodes: Right hilar lymph node 0.7 cm in short axis, not
overtly pathologically enlarged. Similarly a subcarinal lymph node
0.8 cm in short axis on image [DATE] and not pathologically enlarged.
Contrast medium in the distal esophagus suggesting dysmotility or
reflux.

Lungs/Pleura: The left lower lobe nodule measures 2.1 by 1.6 cm,
formerly 3.4 by 3.0 cm by my measurements. Adjacent airspace opacity
may be from radiation pneumonitis although there is also a component
of reticulonodular opacity in this vicinity suggesting atypical
infectious bronchiolitis. There is consolidation below the level of
the nodule.

There is some new nodularity at the right lung apex, somewhat
polygonal or conforming to secondary pulmonary lobules, 1 area
measuring 1.0 by 1.0 cm on image [DATE] and another area with nodular
component measuring 0.9 by 0.6 cm on image [DATE], with some adjacent
indistinct sub solid nodularity. There is also some previous apical
pleural nodularity which is otherwise stable.

A 0.5 by 0.4 cm right upper lobe nodule shown on image [DATE] was
previously less prominent and more linear in configuration. There is
a new 4 mm right upper lobe nodule on image 37/6.

Considerable new nodularity is present along with reticulation in
the right lower lobe, appearance favoring atypical infectious
bronchiolitis over metastatic disease. New airspace opacities in the
posterior basal segment right lower lobe noted.

There is previously a bandlike area of nodularity posteriorly in the
right lower lobe measuring about 9 mm along its pleural component,
currently about 6 mm along its pleural component on image 87/6, on
prior PET-CT of 10/03/2017 this had a maximum SUV of 1.4.

There is some faint reticulonodular opacity in the left upper lobe.

Centrilobular emphysema.

Musculoskeletal: Similar appearance of T7 superior endplate
compression fracture.

CT ABDOMEN PELVIS FINDINGS

Hepatobiliary: Stable mild intrahepatic and moderate extrahepatic
biliary dilatation. Gallbladder unremarkable.

Pancreas: Unremarkable

Spleen: Unremarkable

Adrenals/Urinary Tract: Rim calcified left kidney lower pole
structure with some mixed fatty and higher density internally, not
changed and compatible with prior ablation site. Adrenal glands
normal.

Stomach/Bowel: Staple line along the lesser curvature of the
stomach. Upper normal sized small bowel loops with some scattered
air-fluid levels proximally. There is formed stool in the colon and
contrast medium makes its way through to the colon.

Vascular/Lymphatic: Aortoiliac atherosclerotic vascular disease.

Reproductive: Uterus absent.  Adnexa unremarkable.

Other: No supplemental non-categorized findings.

Musculoskeletal: Bilateral hip screws. Lower lumbar spondylosis and
degenerative disc disease. Grade 1 degenerative retrolisthesis at
L2-3.
IMPRESSION: 1. Significant reduction in the size of the left lower lobe mass,
with surrounding airspace opacity likely representing primarily
combination of radiation pneumonitis and atypical infectious process
given the reticulonodular and consolidative components. New
reticulonodular opacities in the right lower lobe, probably from
atypical infectious process given the associated airway thickening,
with some mild consolidation in the posterior basal segment right
lower lobe.
2. Small new/progressive nodules in the right upper lobe merits
surveillance, these could be infectious or neoplastic.
3. No current findings of metastatic disease to the abdomen/pelvis.
4. Other imaging findings of potential clinical significance: Aortic
Atherosclerosis ([id number].P) and Emphysema ([id number]). Coronary
atherosclerosis. Chronic T7 superior endplate compression fracture.
Chronic biliary dilatation, cause uncertain. Rim calcified left
kidney lower pole structure compatible with prior ablation site.

## 2020-08-21 IMAGING — XA IR US GUIDE VASC ACCESS LEFT
1 series · 2 of 2 positions shown · non-contrast
Comparison: none

INDICATION: 67-year-old female with poor venous access.

[Series 1: run · 0.05mm/px · 2 of 2 slices shown]
[im 1/2]
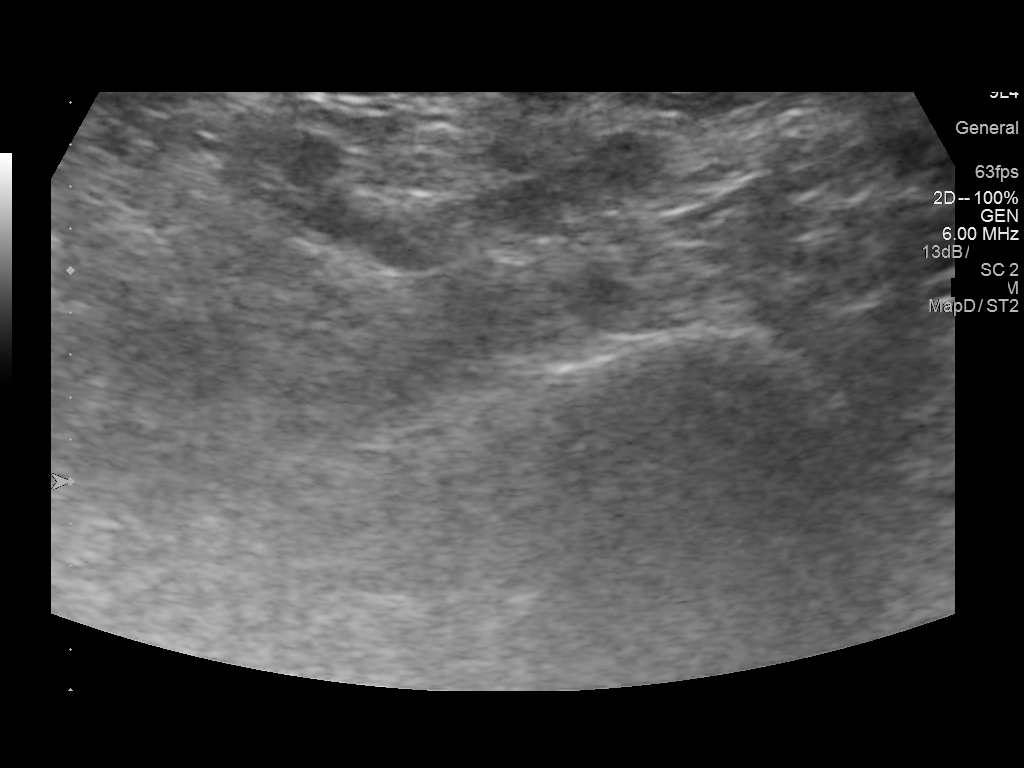
[im 2/2]
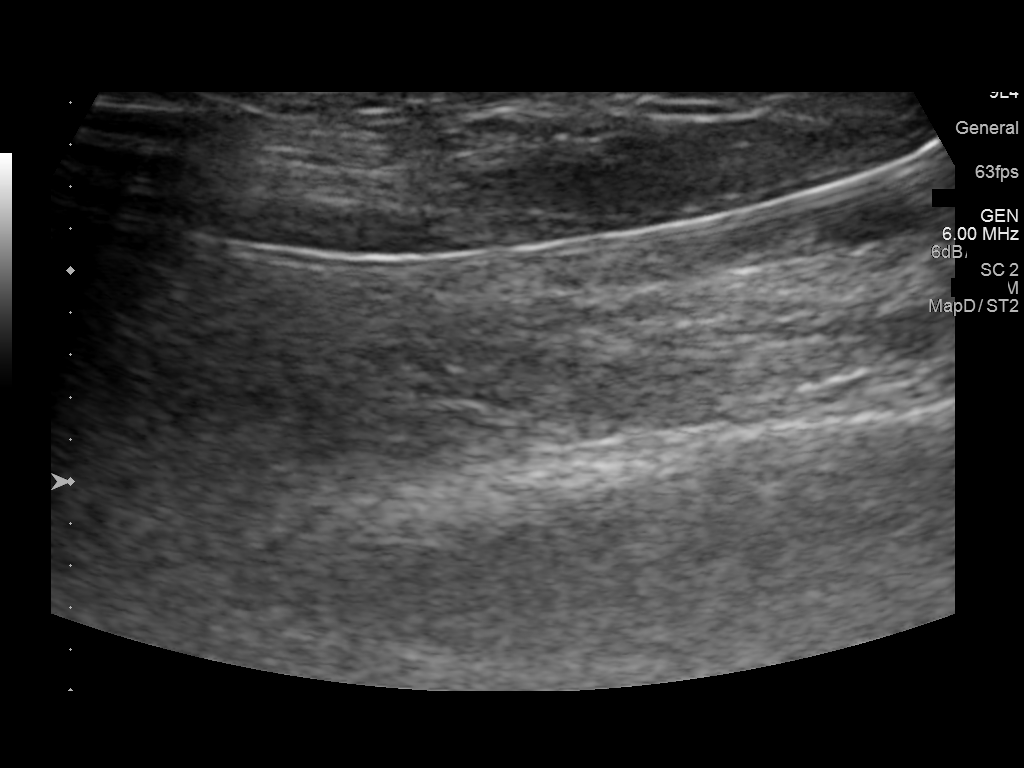

[2 of 2 positions shown; findings below may reference images not displayed]

EXAM:
ULTRASOUND-GUIDED VENA PUNCTURE

MEDICATIONS:
None

ANESTHESIA/SEDATION:
None

FLUOROSCOPY TIME:  None

COMPLICATIONS:
None

PROCEDURE:
Informed written consent was obtained from the patient after a
thorough discussion of the procedural risks, benefits and
alternatives. All questions were addressed. Sterile Barrier
Technique was utilized including caps, mask, sterile gloves, sterile
drape, hand hygiene and skin antiseptic. A timeout was performed
prior to the initiation of the procedure.

Ultrasound survey of the upper extremity was performed with images
stored and sent to PACs.

A micropuncture needle was used access the right brachial vein under
ultrasound. With venous blood flow returned, an .018 micro wire was
passed through the needle into the vein. The needle was removed, and
a micropuncture sheath was placed over the wire.

The inner dilator and wire were removed, and the catheter was
secured in position after attaching to saline flush.

Patient tolerated the procedure well and remained hemodynamically
stable throughout.

No complications were encountered and no significant blood loss.
IMPRESSION: Status post ultrasound-guided IV start.

## 2020-09-21 IMAGING — MR MR HEAD W/O CM
7 of 9 series · 26 of 48 positions shown · IV contrast (agent unspecified)
Comparison: Brain MRI 11/22/2017 and earlier.

CLINICAL DATA: 67-year-old female with metastatic adenocarcinoma of
unknown primary treated with SRS to right parietal brain lesion in
December 2017. Recurrent left side weakness, difficulty tapering off
of steroids.

A study without and with contrast was planned, but the patient has
difficult venous access and after multiple unsuccessful IV access
attempts she declined IV contrast/IV team attempt.
EXAM:
MRI HEAD WITHOUT CONTRAST
TECHNIQUE: Multiplanar, multiecho pulse sequences of the brain and surrounding
structures were obtained without intravenous contrast.

[Series 2: FLAIR · sagittal · 3.0mm · 0.47mm/px · 2 of 37 slices shown (1 of 2)]
[im 1/37]
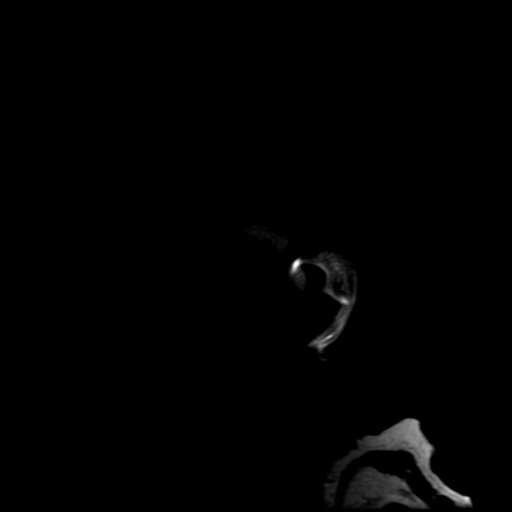
[im 37/37]
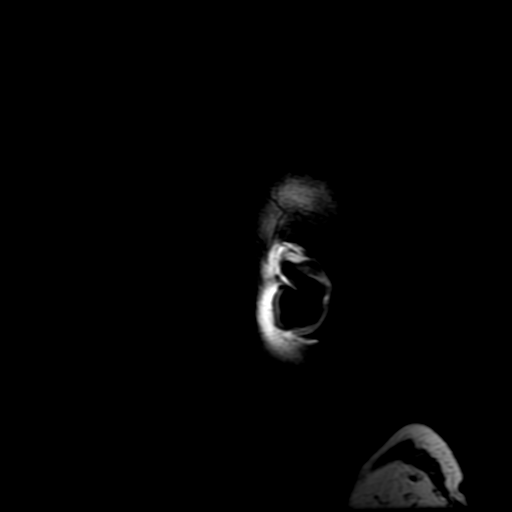

[Series 4: DWI · axial · 3.0mm · 0.94mm/px · z∈[-115,+25]mm · 7 of 100 slices shown]
[im 1/100]
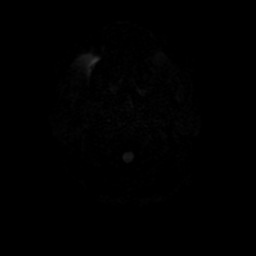
[im 17/100]
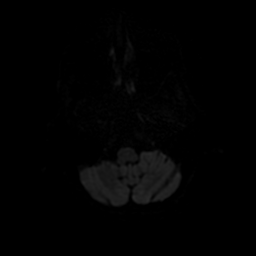
[im 34/100]
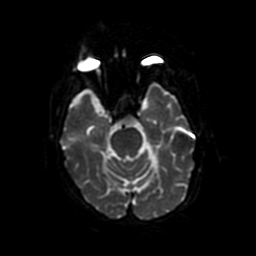
[im 50/100]
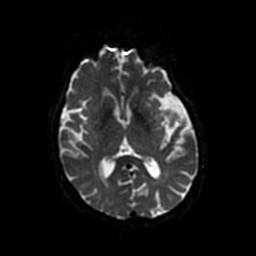
[im 67/100]
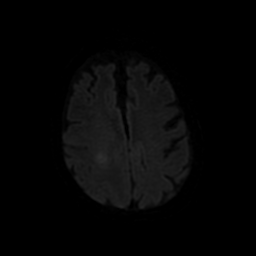
[im 83/100]
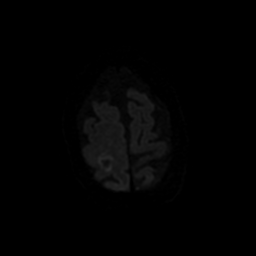
[im 100/100]
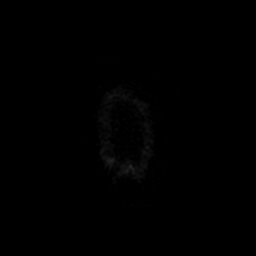

[Series 6: T2 · axial · 5.0mm · 0.47mm/px · z∈[-113,+24]mm · 2 of 25 slices shown (1 of 2)]
[im 1/25]
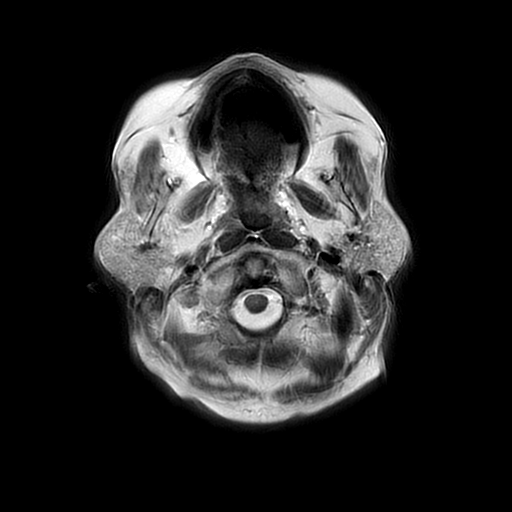
[im 25/25]
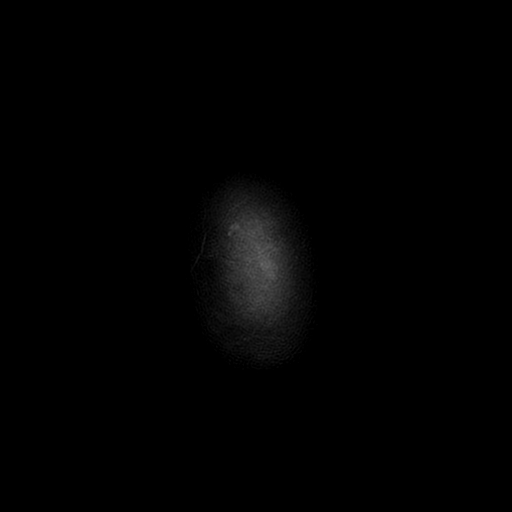

[Series 7: (person_name) · axial · 3.0mm · 0.47mm/px · z∈[-115,-45]mm · 4 of 100 slices shown]
[im 1/100]
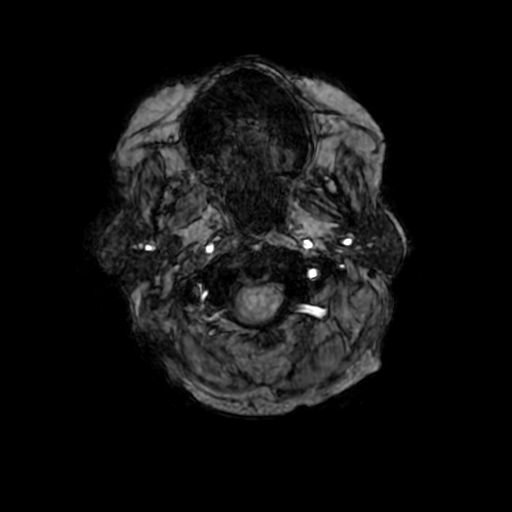
[im 17/100]
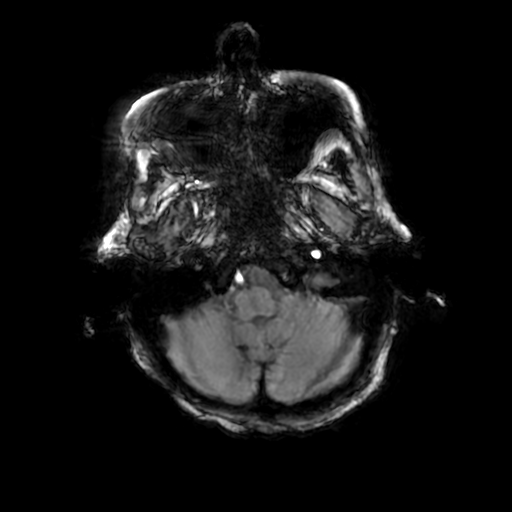
[im 34/100]
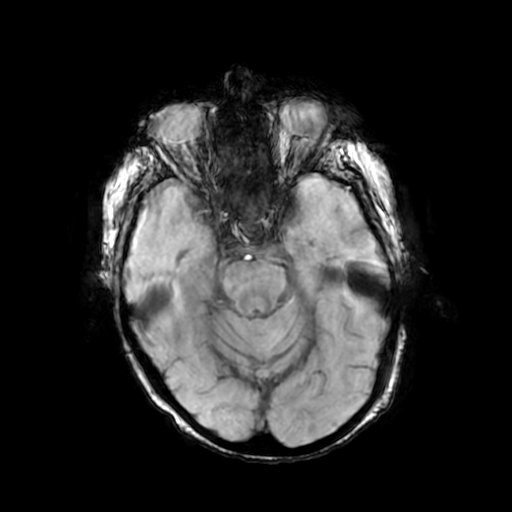
[im 50/100]
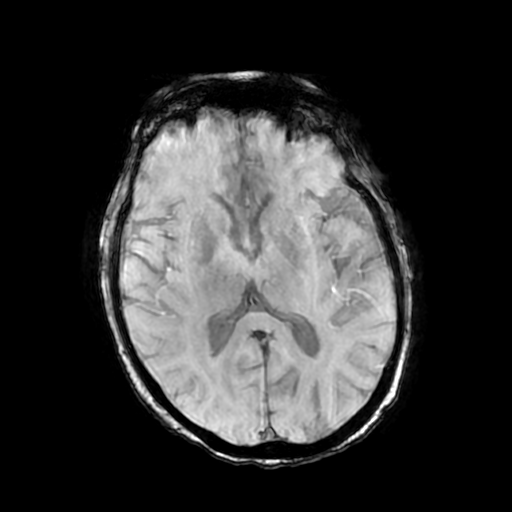

[Series 9: FLAIR · axial · 3.0mm · 0.41mm/px · z∈[-73,+89]mm · 4 of 55 slices shown (2 of 2)]
[im 1/55]
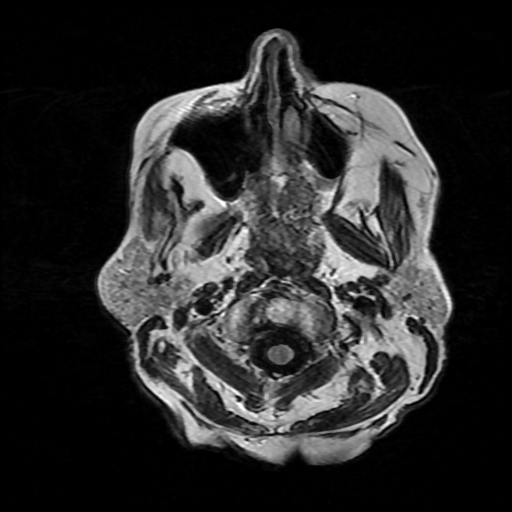
[im 19/55]
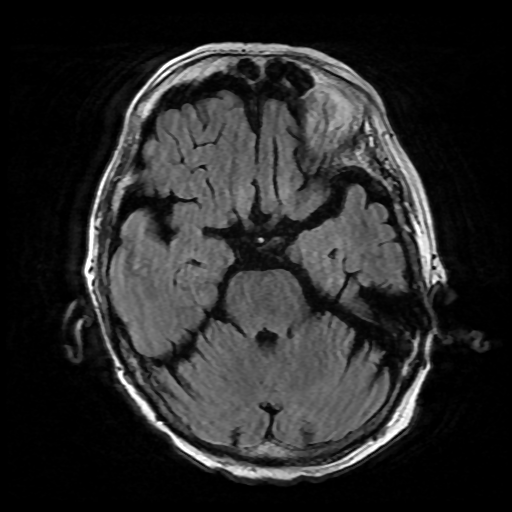
[im 37/55]
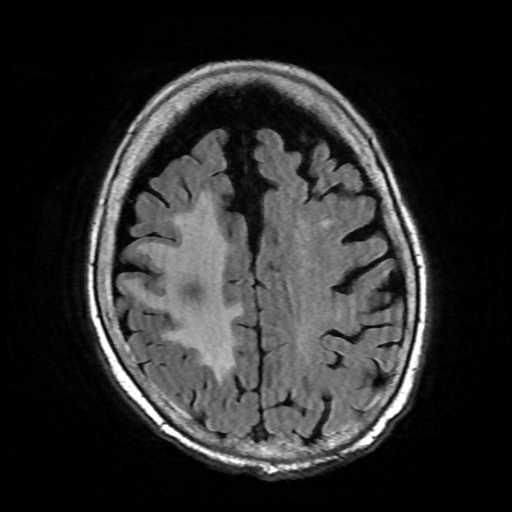
[im 55/55]
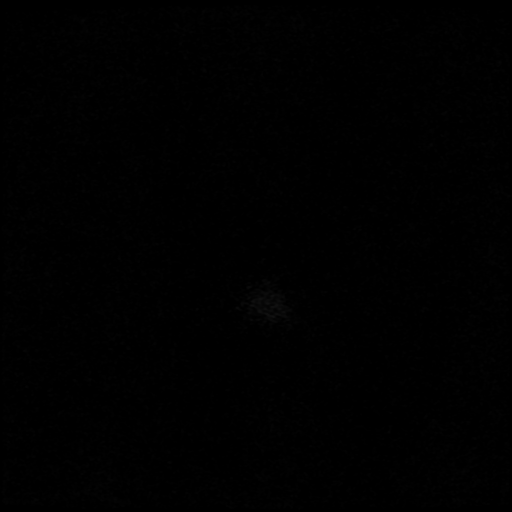

[Series 10: T2 · coronal · 3.0mm · 0.39mm/px · 3 of 45 slices shown (2 of 2)]
[im 1/45]
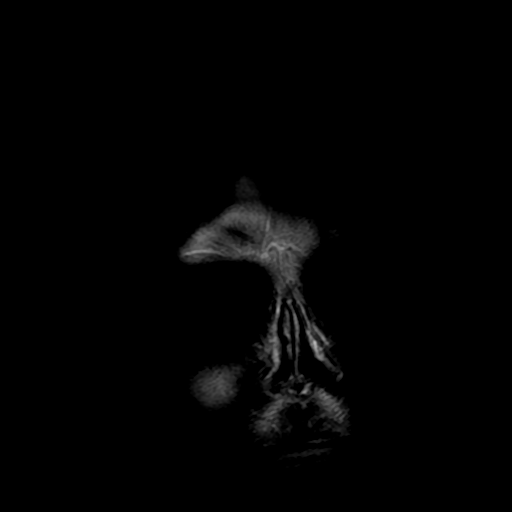
[im 23/45]
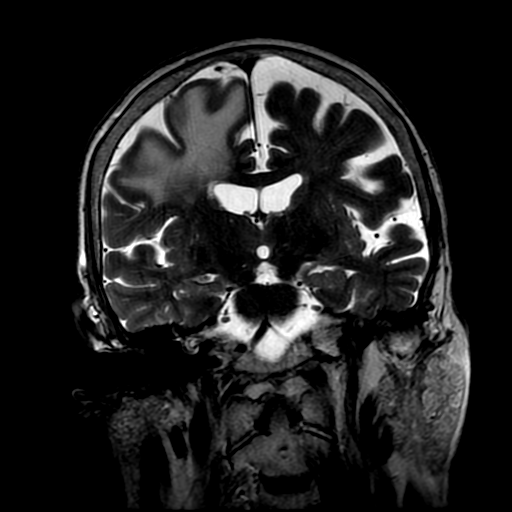
[im 45/45]
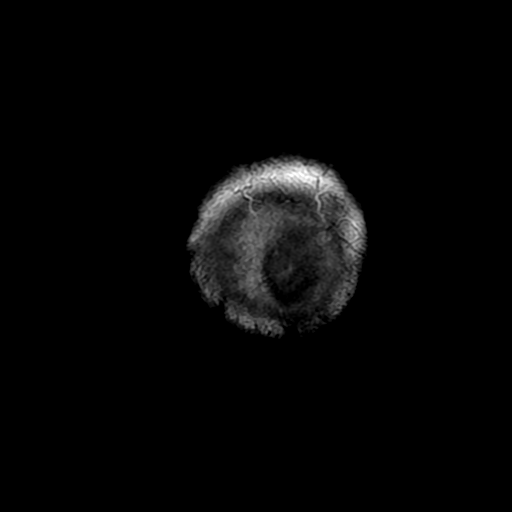

[Series 450: ADC · axial · 3.0mm · 0.94mm/px · z∈[-115,+25]mm · 4 of 50 slices shown]
[im 1/50]
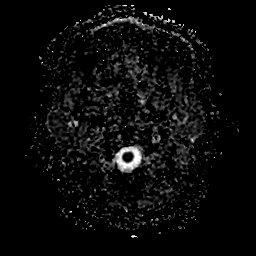
[im 17/50]
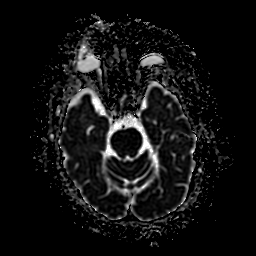
[im 33/50]
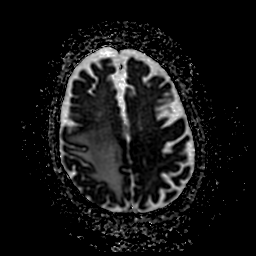
[im 50/50]
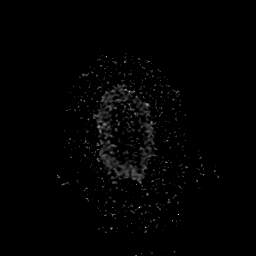

[26 of 48 positions shown; findings below may reference images not displayed]

FINDINGS: Brain: A bilobed or "figure of 8" shaped dark T2 lesion in the right
superior frontal gyrus near the central sulcus measures about 14 x
16 x 26 millimeters (AP by transverse by CC) versus 15 x 15 x 30
millimeters in [REDACTED]. There is restricted diffusion along the
margins, and mild new hemosiderin or mineralization within the
treated lesion on SWI.

But regional confluent T2 and FLAIR hyperintensity most resembling
vasogenic edema has progressed since [REDACTED] and is also more
extensive than compared to the presentation study on 11/21/2017
(series 9, image 41). Regional mass effect has mildly increased,
particularly in the more anterior aspect of the superior frontal
gyrus. Still, there is no midline shift or loss of basilar cisterns.

No other lesion or restricted diffusion is identified in the absence
of contrast. No other areas of vasogenic edema. Stable stable gray
and white matter signal elsewhere. No other intracranial blood
products. No restricted diffusion to suggest acute infarction. No
ventriculomegaly, extra-axial collection or acute intracranial
hemorrhage. Cervicomedullary junction and pituitary are within
normal limits.

Vascular: Major intracranial vascular flow voids are stable.

Skull and upper cervical spine: Negative visible cervical spine.
Normal bone marrow signal.

Sinuses/Orbits: Stable and negative.

Other: Mastoids remain clear. Visible internal auditory structures
appear normal. Negative scalp soft tissues.
IMPRESSION: 1. Substantially progressed vasogenic edema surrounding the treated
right superior frontal gyrus lesion. Mild associated mass effect but
no midline shift. No IV contrast could be administered, but based on
T2 imaging the lesion size has mildly regressed since [REDACTED].
Consider radiation necrosis.
2. No new metastatic disease identified in the absence of contrast.

## 2020-09-25 IMAGING — DX ABDOMEN - 1 VIEW
1 series · 1 of 1 positions shown · non-contrast
Comparison: CT abdomen and pelvis February 06, 2018

CLINICAL DATA: Abdominal distension.

EXAM:
ABDOMEN - 1 VIEW

[abdomen kub]
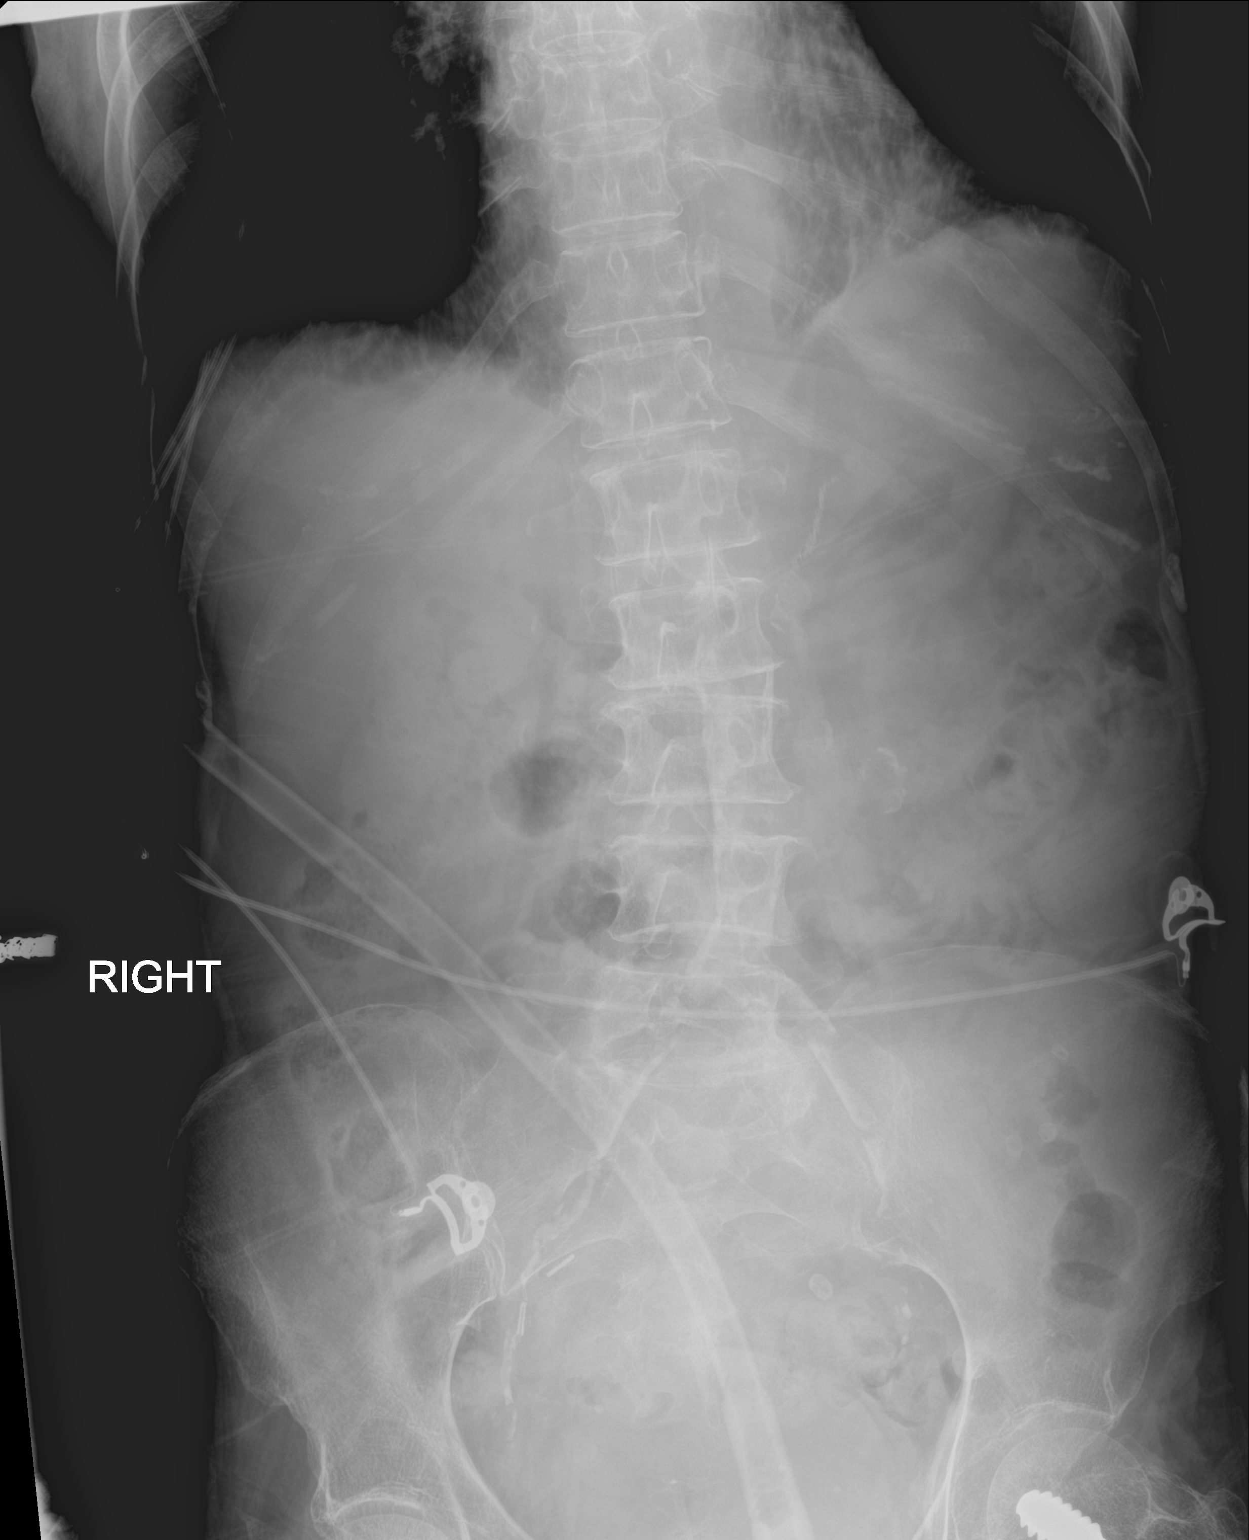

[1 of 1 positions shown; findings below may reference images not displayed]

FINDINGS: Bowel gas pattern is nondilated and nonobstructive. Advanced
vascular calcifications. Surgical staples LEFT upper quadrant.
Surgical clips project RIGHT pelvis. No intra-abdominal mass effect,
pathologic calcifications or free air. Rounded calcification LEFT
mid abdomen corresponding to renal lesion on prior CT. Soft tissue
planes and included osseous structures are nonsuspicious. LEFT femur
pinning.
IMPRESSION: Nonspecific bowel gas pattern.

Aortic Atherosclerosis ([id number]).

## 2020-09-25 IMAGING — DX PORTABLE CHEST - 1 VIEW
1 series · 1 of 1 positions shown · non-contrast
Comparison: 02/06/2018 CT chest.

CLINICAL DATA: 67 y/o F; sepsis. History of metastatic
adenocarcinoma of unknown primary.

EXAM:
PORTABLE CHEST 1 VIEW

[chest ap]
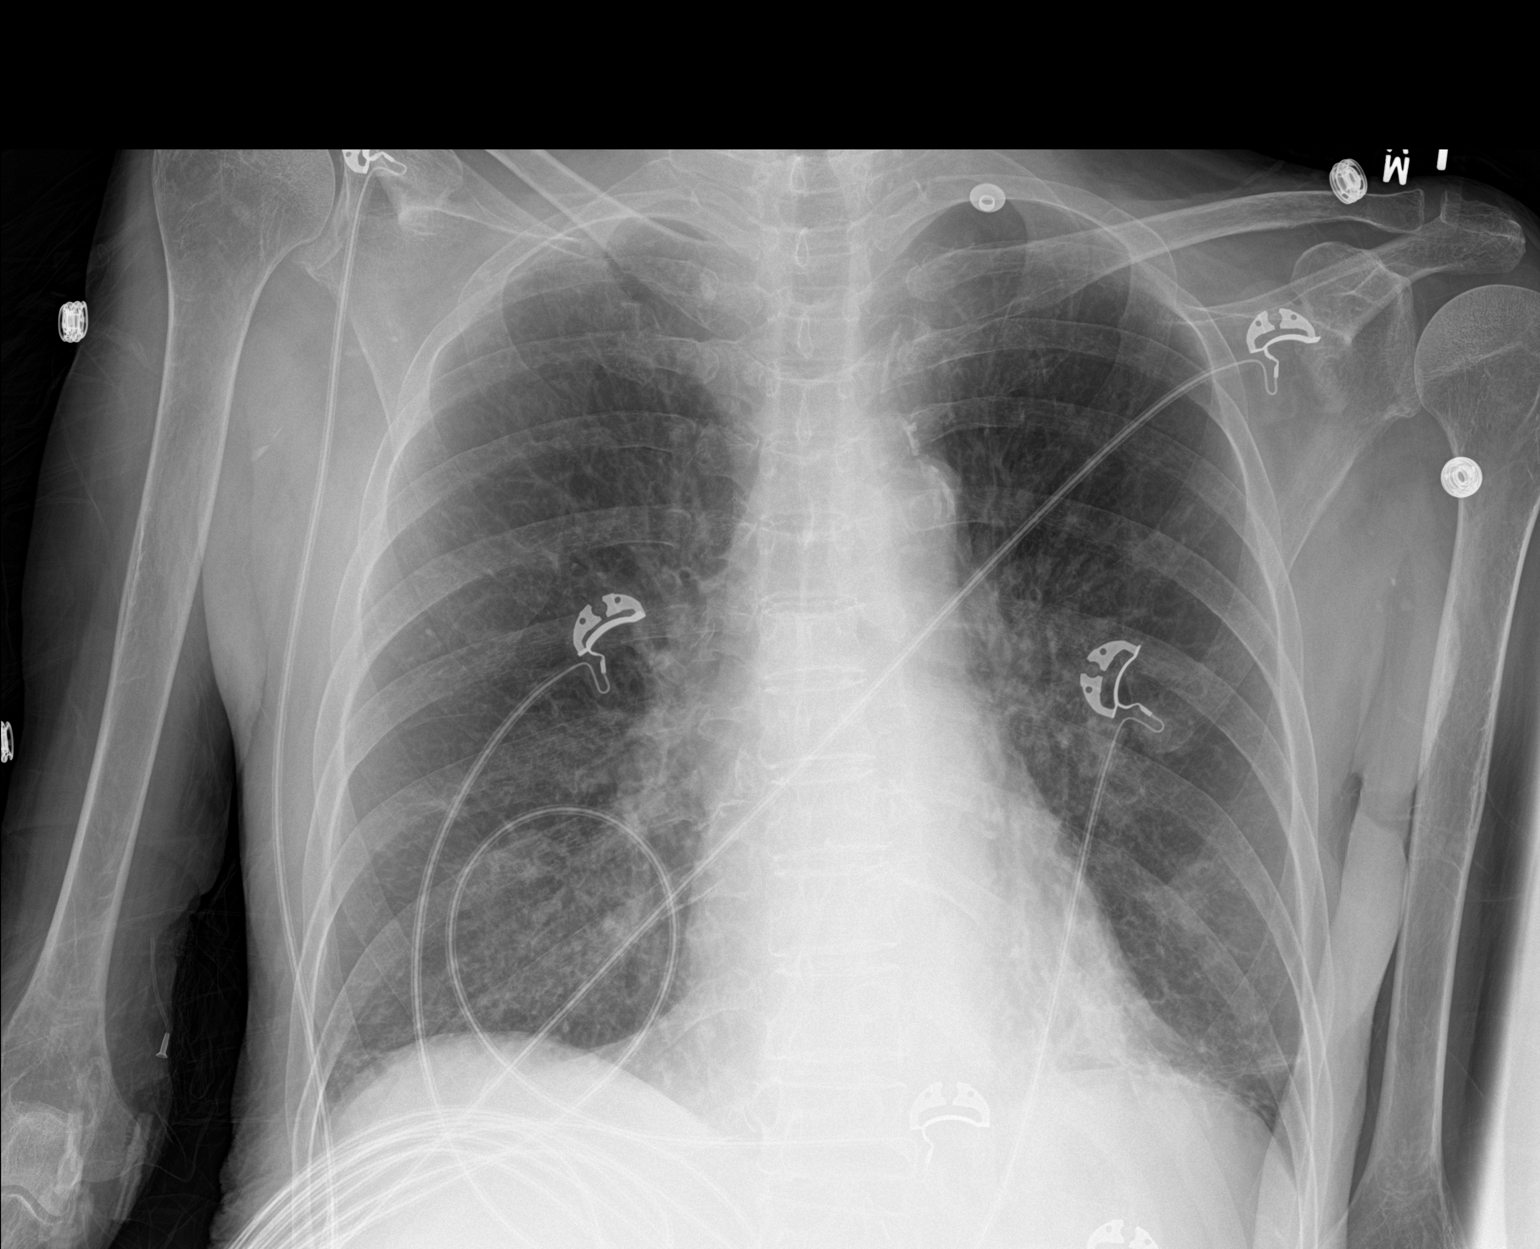

[1 of 1 positions shown; findings below may reference images not displayed]

FINDINGS: The heart size and mediastinal contours are within normal limits.
Aortic calcific atherosclerosis. Reticulonodular opacities in the
lung bases compatible with bronchitis/bronchiolitis similar to prior
CT of chest. Left basilar consolidation appears improved. No new
consolidation, effusion, or pneumothorax. No acute osseous
abnormality is evident.
IMPRESSION: Reticulonodular opacities in the lung bases compatible with
bronchitis/bronchiolitis similar to prior CT of chest. Left basilar
consolidation appears improved given differences in technique. No
new consolidation.
